# Patient Record
Sex: Female | Born: 1960 | Race: White | Hispanic: No | Marital: Married | Smoking: Never smoker
Health system: Southern US, Community
[De-identification: ages and names within clinical notes are randomized; demographics above are authoritative.]

## PROBLEM LIST (undated history)

## (undated) ENCOUNTER — Emergency Department: Admission: EM | Source: Home / Self Care

## (undated) DIAGNOSIS — I359 Nonrheumatic aortic valve disorder, unspecified: Secondary | ICD-10-CM

## (undated) DIAGNOSIS — R112 Nausea with vomiting, unspecified: Secondary | ICD-10-CM

## (undated) DIAGNOSIS — T7840XA Allergy, unspecified, initial encounter: Secondary | ICD-10-CM

## (undated) DIAGNOSIS — K219 Gastro-esophageal reflux disease without esophagitis: Secondary | ICD-10-CM

## (undated) DIAGNOSIS — I330 Acute and subacute infective endocarditis: Secondary | ICD-10-CM

## (undated) DIAGNOSIS — I890 Lymphedema, not elsewhere classified: Secondary | ICD-10-CM

## (undated) DIAGNOSIS — Z9889 Other specified postprocedural states: Secondary | ICD-10-CM

## (undated) DIAGNOSIS — R55 Syncope and collapse: Secondary | ICD-10-CM

## (undated) DIAGNOSIS — C50919 Malignant neoplasm of unspecified site of unspecified female breast: Secondary | ICD-10-CM

## (undated) DIAGNOSIS — K589 Irritable bowel syndrome without diarrhea: Secondary | ICD-10-CM

## (undated) DIAGNOSIS — R600 Localized edema: Secondary | ICD-10-CM

## (undated) DIAGNOSIS — M4802 Spinal stenosis, cervical region: Secondary | ICD-10-CM

## (undated) DIAGNOSIS — G35 Multiple sclerosis: Secondary | ICD-10-CM

## (undated) DIAGNOSIS — R569 Unspecified convulsions: Secondary | ICD-10-CM

## (undated) DIAGNOSIS — G709 Myoneural disorder, unspecified: Secondary | ICD-10-CM

## (undated) DIAGNOSIS — R011 Cardiac murmur, unspecified: Secondary | ICD-10-CM

## (undated) HISTORY — PX: CHOLECYSTECTOMY: SHX55

## (undated) HISTORY — DX: Gastro-esophageal reflux disease without esophagitis: K21.9

## (undated) HISTORY — DX: Nonrheumatic aortic valve disorder, unspecified: I35.9

## (undated) HISTORY — PX: PORT-A-CATH REMOVAL: SHX5289

## (undated) HISTORY — PX: TONSILLECTOMY: SUR1361

## (undated) HISTORY — DX: Multiple sclerosis: G35

## (undated) HISTORY — DX: Syncope and collapse: R55

## (undated) HISTORY — DX: Allergy, unspecified, initial encounter: T78.40XA

## (undated) HISTORY — DX: Localized edema: R60.0

## (undated) HISTORY — PX: ANKLE SURGERY: SHX546

## (undated) HISTORY — DX: Malignant neoplasm of unspecified site of unspecified female breast: C50.919

## (undated) HISTORY — PX: SPINE SURGERY: SHX786

## (undated) HISTORY — PX: TRANSTHORACIC ECHOCARDIOGRAM: SHX275

## (undated) HISTORY — DX: Spinal stenosis, cervical region: M48.02

## (undated) HISTORY — DX: Acute and subacute infective endocarditis: I33.0

---

## 1999-02-08 DIAGNOSIS — G35 Multiple sclerosis: Secondary | ICD-10-CM

## 1999-02-08 HISTORY — DX: Multiple sclerosis: G35

## 2010-01-19 HISTORY — PX: OTHER SURGICAL HISTORY: SHX169

## 2010-10-09 ENCOUNTER — Ambulatory Visit: Payer: Self-pay | Admitting: Oncology

## 2010-10-15 ENCOUNTER — Ambulatory Visit: Payer: Self-pay | Admitting: Oncology

## 2010-11-03 ENCOUNTER — Other Ambulatory Visit: Payer: Self-pay | Admitting: Diagnostic Neuroimaging

## 2010-11-03 DIAGNOSIS — G35 Multiple sclerosis: Secondary | ICD-10-CM

## 2010-11-08 ENCOUNTER — Ambulatory Visit: Payer: Self-pay | Admitting: Oncology

## 2010-11-10 ENCOUNTER — Ambulatory Visit
Admission: RE | Admit: 2010-11-10 | Discharge: 2010-11-10 | Disposition: A | Discharge status: Home / Self Care | Payer: Medicare Other | Source: Ambulatory Visit | Attending: Diagnostic Neuroimaging | Admitting: Diagnostic Neuroimaging

## 2010-11-10 DIAGNOSIS — G35 Multiple sclerosis: Secondary | ICD-10-CM

## 2010-11-10 MED ORDER — GADOBENATE DIMEGLUMINE 529 MG/ML IV SOLN
17.0000 mL | Freq: Once | INTRAVENOUS | Status: AC | PRN
  Administered 2010-11-10: 17 mL via INTRAVENOUS

## 2011-02-16 DIAGNOSIS — G35 Multiple sclerosis: Secondary | ICD-10-CM | POA: Diagnosis not present

## 2011-02-21 DIAGNOSIS — R55 Syncope and collapse: Secondary | ICD-10-CM | POA: Diagnosis not present

## 2011-02-21 DIAGNOSIS — R011 Cardiac murmur, unspecified: Secondary | ICD-10-CM | POA: Diagnosis not present

## 2011-02-21 DIAGNOSIS — Z01818 Encounter for other preprocedural examination: Secondary | ICD-10-CM | POA: Diagnosis not present

## 2011-02-28 ENCOUNTER — Emergency Department: Payer: Self-pay | Admitting: Emergency Medicine

## 2011-02-28 DIAGNOSIS — R404 Transient alteration of awareness: Secondary | ICD-10-CM | POA: Diagnosis not present

## 2011-02-28 DIAGNOSIS — R5383 Other fatigue: Secondary | ICD-10-CM | POA: Diagnosis not present

## 2011-02-28 DIAGNOSIS — R569 Unspecified convulsions: Secondary | ICD-10-CM | POA: Diagnosis not present

## 2011-02-28 DIAGNOSIS — R5381 Other malaise: Secondary | ICD-10-CM | POA: Diagnosis not present

## 2011-02-28 DIAGNOSIS — R4182 Altered mental status, unspecified: Secondary | ICD-10-CM | POA: Diagnosis not present

## 2011-02-28 DIAGNOSIS — Z79899 Other long term (current) drug therapy: Secondary | ICD-10-CM | POA: Diagnosis not present

## 2011-02-28 DIAGNOSIS — G40909 Epilepsy, unspecified, not intractable, without status epilepticus: Secondary | ICD-10-CM | POA: Diagnosis not present

## 2011-02-28 DIAGNOSIS — G35 Multiple sclerosis: Secondary | ICD-10-CM | POA: Diagnosis not present

## 2011-02-28 LAB — BASIC METABOLIC PANEL
BUN: 14 mg/dL (ref 7–18)
Chloride: 104 mmol/L (ref 98–107)
Co2: 29 mmol/L (ref 21–32)
Creatinine: 0.68 mg/dL (ref 0.60–1.30)
Glucose: 104 mg/dL — ABNORMAL HIGH (ref 65–99)
Potassium: 3.9 mmol/L (ref 3.5–5.1)
Sodium: 144 mmol/L (ref 136–145)

## 2011-02-28 LAB — URINALYSIS, COMPLETE
Ketone: NEGATIVE
Nitrite: NEGATIVE
Ph: 8 (ref 4.5–8.0)
Protein: NEGATIVE
RBC,UR: 1 /HPF (ref 0–5)
Specific Gravity: 1.014 (ref 1.003–1.030)
WBC UR: 10 /HPF (ref 0–5)

## 2011-02-28 LAB — CBC
HCT: 38.5 % (ref 35.0–47.0)
HGB: 13 g/dL (ref 12.0–16.0)
MCH: 30.4 pg (ref 26.0–34.0)
MCV: 90 fL (ref 80–100)
Platelet: 142 10*3/uL — ABNORMAL LOW (ref 150–440)
RBC: 4.27 10*6/uL (ref 3.80–5.20)
WBC: 4.3 10*3/uL (ref 3.6–11.0)

## 2011-02-28 LAB — CK TOTAL AND CKMB (NOT AT ARMC): CK, Total: 107 U/L (ref 21–215)

## 2011-02-28 LAB — TROPONIN I: Troponin-I: <0.02 ng/mL

## 2011-03-03 DIAGNOSIS — R569 Unspecified convulsions: Secondary | ICD-10-CM | POA: Diagnosis not present

## 2011-03-03 DIAGNOSIS — G35 Multiple sclerosis: Secondary | ICD-10-CM | POA: Diagnosis not present

## 2011-03-10 ENCOUNTER — Other Ambulatory Visit: Payer: Self-pay | Admitting: Diagnostic Neuroimaging

## 2011-03-10 DIAGNOSIS — G35 Multiple sclerosis: Secondary | ICD-10-CM

## 2011-03-17 ENCOUNTER — Ambulatory Visit
Admission: RE | Admit: 2011-03-17 | Discharge: 2011-03-17 | Disposition: A | Discharge status: Home / Self Care | Payer: Medicare Other | Source: Ambulatory Visit | Attending: Diagnostic Neuroimaging | Admitting: Diagnostic Neuroimaging

## 2011-03-17 DIAGNOSIS — G35 Multiple sclerosis: Secondary | ICD-10-CM

## 2011-03-17 DIAGNOSIS — I609 Nontraumatic subarachnoid hemorrhage, unspecified: Secondary | ICD-10-CM | POA: Diagnosis not present

## 2011-03-17 MED ORDER — GADOBENATE DIMEGLUMINE 529 MG/ML IV SOLN
15.0000 mL | Freq: Once | INTRAVENOUS | Status: AC | PRN
  Administered 2011-03-17: 15 mL via INTRAVENOUS

## 2011-04-07 DIAGNOSIS — G35 Multiple sclerosis: Secondary | ICD-10-CM | POA: Diagnosis not present

## 2011-04-18 DIAGNOSIS — R569 Unspecified convulsions: Secondary | ICD-10-CM | POA: Diagnosis not present

## 2011-04-18 DIAGNOSIS — G35 Multiple sclerosis: Secondary | ICD-10-CM | POA: Diagnosis not present

## 2011-04-19 DIAGNOSIS — Z01818 Encounter for other preprocedural examination: Secondary | ICD-10-CM | POA: Diagnosis not present

## 2011-04-19 DIAGNOSIS — Z5181 Encounter for therapeutic drug level monitoring: Secondary | ICD-10-CM | POA: Diagnosis not present

## 2011-05-11 ENCOUNTER — Encounter: Payer: Self-pay | Admitting: Diagnostic Neuroimaging

## 2011-05-11 DIAGNOSIS — G35 Multiple sclerosis: Secondary | ICD-10-CM | POA: Diagnosis not present

## 2011-05-11 DIAGNOSIS — M25669 Stiffness of unspecified knee, not elsewhere classified: Secondary | ICD-10-CM | POA: Diagnosis not present

## 2011-05-11 DIAGNOSIS — M6281 Muscle weakness (generalized): Secondary | ICD-10-CM | POA: Diagnosis not present

## 2011-05-11 DIAGNOSIS — IMO0001 Reserved for inherently not codable concepts without codable children: Secondary | ICD-10-CM | POA: Diagnosis not present

## 2011-05-11 DIAGNOSIS — R262 Difficulty in walking, not elsewhere classified: Secondary | ICD-10-CM | POA: Diagnosis not present

## 2011-05-16 DIAGNOSIS — M6281 Muscle weakness (generalized): Secondary | ICD-10-CM | POA: Diagnosis not present

## 2011-05-16 DIAGNOSIS — R262 Difficulty in walking, not elsewhere classified: Secondary | ICD-10-CM | POA: Diagnosis not present

## 2011-05-16 DIAGNOSIS — IMO0001 Reserved for inherently not codable concepts without codable children: Secondary | ICD-10-CM | POA: Diagnosis not present

## 2011-05-16 DIAGNOSIS — M25669 Stiffness of unspecified knee, not elsewhere classified: Secondary | ICD-10-CM | POA: Diagnosis not present

## 2011-05-16 DIAGNOSIS — G35 Multiple sclerosis: Secondary | ICD-10-CM | POA: Diagnosis not present

## 2011-05-23 DIAGNOSIS — R262 Difficulty in walking, not elsewhere classified: Secondary | ICD-10-CM | POA: Diagnosis not present

## 2011-05-23 DIAGNOSIS — M6281 Muscle weakness (generalized): Secondary | ICD-10-CM | POA: Diagnosis not present

## 2011-05-23 DIAGNOSIS — IMO0001 Reserved for inherently not codable concepts without codable children: Secondary | ICD-10-CM | POA: Diagnosis not present

## 2011-05-23 DIAGNOSIS — G35 Multiple sclerosis: Secondary | ICD-10-CM | POA: Diagnosis not present

## 2011-05-23 DIAGNOSIS — M25669 Stiffness of unspecified knee, not elsewhere classified: Secondary | ICD-10-CM | POA: Diagnosis not present

## 2011-05-25 DIAGNOSIS — IMO0001 Reserved for inherently not codable concepts without codable children: Secondary | ICD-10-CM | POA: Diagnosis not present

## 2011-05-25 DIAGNOSIS — M25669 Stiffness of unspecified knee, not elsewhere classified: Secondary | ICD-10-CM | POA: Diagnosis not present

## 2011-05-25 DIAGNOSIS — M6281 Muscle weakness (generalized): Secondary | ICD-10-CM | POA: Diagnosis not present

## 2011-05-25 DIAGNOSIS — G35 Multiple sclerosis: Secondary | ICD-10-CM | POA: Diagnosis not present

## 2011-05-25 DIAGNOSIS — R262 Difficulty in walking, not elsewhere classified: Secondary | ICD-10-CM | POA: Diagnosis not present

## 2011-05-31 DIAGNOSIS — B029 Zoster without complications: Secondary | ICD-10-CM | POA: Diagnosis not present

## 2011-05-31 DIAGNOSIS — L0100 Impetigo, unspecified: Secondary | ICD-10-CM | POA: Diagnosis not present

## 2011-06-01 ENCOUNTER — Other Ambulatory Visit: Payer: Self-pay | Admitting: Diagnostic Neuroimaging

## 2011-06-01 DIAGNOSIS — G35 Multiple sclerosis: Secondary | ICD-10-CM

## 2011-06-01 DIAGNOSIS — B029 Zoster without complications: Secondary | ICD-10-CM

## 2011-06-02 ENCOUNTER — Ambulatory Visit
Admission: RE | Admit: 2011-06-02 | Discharge: 2011-06-02 | Disposition: A | Discharge status: Home / Self Care | Payer: Medicare Other | Source: Ambulatory Visit | Attending: Diagnostic Neuroimaging | Admitting: Diagnostic Neuroimaging

## 2011-06-02 DIAGNOSIS — G35 Multiple sclerosis: Secondary | ICD-10-CM

## 2011-06-02 DIAGNOSIS — B029 Zoster without complications: Secondary | ICD-10-CM | POA: Diagnosis not present

## 2011-06-02 MED ORDER — GADOBENATE DIMEGLUMINE 529 MG/ML IV SOLN
16.0000 mL | Freq: Once | INTRAVENOUS | Status: AC | PRN
  Administered 2011-06-02: 16 mL via INTRAVENOUS

## 2011-06-03 DIAGNOSIS — G35 Multiple sclerosis: Secondary | ICD-10-CM | POA: Diagnosis not present

## 2011-06-03 DIAGNOSIS — Z452 Encounter for adjustment and management of vascular access device: Secondary | ICD-10-CM | POA: Diagnosis not present

## 2011-06-03 DIAGNOSIS — B029 Zoster without complications: Secondary | ICD-10-CM | POA: Diagnosis not present

## 2011-06-06 DIAGNOSIS — B029 Zoster without complications: Secondary | ICD-10-CM | POA: Diagnosis not present

## 2011-06-06 DIAGNOSIS — Z452 Encounter for adjustment and management of vascular access device: Secondary | ICD-10-CM | POA: Diagnosis not present

## 2011-06-06 DIAGNOSIS — G35 Multiple sclerosis: Secondary | ICD-10-CM | POA: Diagnosis not present

## 2011-06-08 ENCOUNTER — Encounter: Payer: Self-pay | Admitting: Diagnostic Neuroimaging

## 2011-06-08 DIAGNOSIS — G40209 Localization-related (focal) (partial) symptomatic epilepsy and epileptic syndromes with complex partial seizures, not intractable, without status epilepticus: Secondary | ICD-10-CM | POA: Insufficient documentation

## 2011-06-08 DIAGNOSIS — R55 Syncope and collapse: Secondary | ICD-10-CM

## 2011-06-08 DIAGNOSIS — G35 Multiple sclerosis: Secondary | ICD-10-CM | POA: Insufficient documentation

## 2011-06-08 DIAGNOSIS — B029 Zoster without complications: Secondary | ICD-10-CM | POA: Insufficient documentation

## 2011-06-08 DIAGNOSIS — B009 Herpesviral infection, unspecified: Secondary | ICD-10-CM

## 2011-06-09 ENCOUNTER — Ambulatory Visit (INDEPENDENT_AMBULATORY_CARE_PROVIDER_SITE_OTHER): Payer: Medicare Other | Admitting: Internal Medicine

## 2011-06-09 ENCOUNTER — Encounter: Payer: Self-pay | Admitting: Internal Medicine

## 2011-06-09 DIAGNOSIS — B029 Zoster without complications: Secondary | ICD-10-CM

## 2011-06-09 NOTE — Progress Notes (Signed)
  Subjective:    Patient ID: Rebecca [name], female    DOB: [birth year], 51 y.o.   MRN: [id number]  HPI She comes in as a new patient with a complaint of a zoster infection. She has a history of multiple sclerosis and is on immunosuppressive therapy and developed right facial and right ear zoster. No history of this before. She has been on acyclovir IV for about 5 or 6 days started by her neurologist. She does have a port in place so was able to do that. She had seen a dermatologist prior to that and was on about 2 days of oral Valtrex. Her lesions are crusting over. She does have significant pain but it is improved. She though has been unable to tolerate the acyclovir to 2 diarrhea. Because she is off her MS medications she has had difficulty ambulating on top of that. Fever and no chills.   Review of Systems  Constitutional: Negative.   HENT: Positive for ear pain and ear discharge. Negative for tinnitus.   Eyes: Negative.   Respiratory: Negative.   Cardiovascular: Negative.   Gastrointestinal: Negative.   Skin: Positive for rash and wound.  Hematological: Negative.        Objective:   Physical Exam  Skin:       Right jaw facial lesions consistent with zoster that are crusting with no discharge. Also with right ear lesion though the outer ear is clear. No surrounding erythema.          Assessment & Plan:

## 2011-06-09 NOTE — Assessment & Plan Note (Signed)
I discussed with her the treatment course of the zoster. I do agree with the IV acyclovir however since she is not tolerating it, and she has received 5 days, I do feel it would be appropriate to change back to the oral therapy with Valtrex 1000 mg 3 times a day for one more week. The lesions are drying and appeared to be healing well. The patient already has Valtrex at home and will continue that and acyclovir can be stopped.

## 2011-06-10 DIAGNOSIS — Z452 Encounter for adjustment and management of vascular access device: Secondary | ICD-10-CM | POA: Diagnosis not present

## 2011-06-10 DIAGNOSIS — B029 Zoster without complications: Secondary | ICD-10-CM | POA: Diagnosis not present

## 2011-06-10 DIAGNOSIS — G35 Multiple sclerosis: Secondary | ICD-10-CM | POA: Diagnosis not present

## 2011-07-27 DIAGNOSIS — B029 Zoster without complications: Secondary | ICD-10-CM | POA: Diagnosis not present

## 2011-07-27 DIAGNOSIS — G35 Multiple sclerosis: Secondary | ICD-10-CM | POA: Diagnosis not present

## 2011-07-29 ENCOUNTER — Other Ambulatory Visit: Payer: Self-pay | Admitting: Diagnostic Neuroimaging

## 2011-07-29 DIAGNOSIS — G35 Multiple sclerosis: Secondary | ICD-10-CM

## 2011-07-29 DIAGNOSIS — B029 Zoster without complications: Secondary | ICD-10-CM

## 2011-08-03 ENCOUNTER — Ambulatory Visit
Admission: RE | Admit: 2011-08-03 | Discharge: 2011-08-03 | Disposition: A | Discharge status: Home / Self Care | Payer: Medicare Other | Source: Ambulatory Visit | Attending: Diagnostic Neuroimaging | Admitting: Diagnostic Neuroimaging

## 2011-08-03 DIAGNOSIS — B029 Zoster without complications: Secondary | ICD-10-CM

## 2011-08-03 DIAGNOSIS — G35D Multiple sclerosis, unspecified: Secondary | ICD-10-CM

## 2011-08-03 DIAGNOSIS — G35 Multiple sclerosis: Secondary | ICD-10-CM

## 2011-08-03 MED ORDER — GADOBENATE DIMEGLUMINE 529 MG/ML IV SOLN: 15.0000 mL | Freq: Once | INTRAVENOUS | Status: AC | PRN

## 2011-09-15 DIAGNOSIS — G35 Multiple sclerosis: Secondary | ICD-10-CM | POA: Diagnosis not present

## 2011-09-15 DIAGNOSIS — B029 Zoster without complications: Secondary | ICD-10-CM | POA: Diagnosis not present

## 2011-09-21 DIAGNOSIS — M4712 Other spondylosis with myelopathy, cervical region: Secondary | ICD-10-CM | POA: Diagnosis not present

## 2011-09-21 DIAGNOSIS — M4802 Spinal stenosis, cervical region: Secondary | ICD-10-CM | POA: Diagnosis not present

## 2011-09-21 DIAGNOSIS — G35 Multiple sclerosis: Secondary | ICD-10-CM | POA: Diagnosis not present

## 2011-09-21 DIAGNOSIS — M542 Cervicalgia: Secondary | ICD-10-CM | POA: Diagnosis not present

## 2011-09-21 DIAGNOSIS — M5000 Cervical disc disorder with myelopathy, unspecified cervical region: Secondary | ICD-10-CM | POA: Diagnosis not present

## 2011-09-27 ENCOUNTER — Other Ambulatory Visit: Payer: Self-pay | Admitting: Neurosurgery

## 2011-09-27 DIAGNOSIS — G35 Multiple sclerosis: Secondary | ICD-10-CM

## 2011-10-04 ENCOUNTER — Ambulatory Visit
Admission: RE | Admit: 2011-10-04 | Discharge: 2011-10-04 | Disposition: A | Discharge status: Home / Self Care | Payer: Medicare Other | Source: Ambulatory Visit | Attending: Neurosurgery | Admitting: Neurosurgery

## 2011-10-04 ENCOUNTER — Other Ambulatory Visit: Payer: Medicare Other

## 2011-10-04 DIAGNOSIS — M47812 Spondylosis without myelopathy or radiculopathy, cervical region: Secondary | ICD-10-CM | POA: Diagnosis not present

## 2011-10-04 DIAGNOSIS — G35 Multiple sclerosis: Secondary | ICD-10-CM

## 2011-10-04 DIAGNOSIS — M5020 Other cervical disc displacement, unspecified cervical region: Secondary | ICD-10-CM | POA: Diagnosis not present

## 2011-10-13 DIAGNOSIS — M533 Sacrococcygeal disorders, not elsewhere classified: Secondary | ICD-10-CM | POA: Diagnosis not present

## 2011-10-13 DIAGNOSIS — M5000 Cervical disc disorder with myelopathy, unspecified cervical region: Secondary | ICD-10-CM | POA: Diagnosis not present

## 2011-10-13 DIAGNOSIS — G35 Multiple sclerosis: Secondary | ICD-10-CM | POA: Diagnosis not present

## 2011-10-13 DIAGNOSIS — M48061 Spinal stenosis, lumbar region without neurogenic claudication: Secondary | ICD-10-CM | POA: Diagnosis not present

## 2011-10-14 ENCOUNTER — Other Ambulatory Visit: Payer: Self-pay | Admitting: Neurosurgery

## 2011-10-27 DIAGNOSIS — G35 Multiple sclerosis: Secondary | ICD-10-CM | POA: Diagnosis not present

## 2011-11-02 ENCOUNTER — Encounter (HOSPITAL_COMMUNITY): Payer: Self-pay | Admitting: Pharmacy Technician

## 2011-11-04 NOTE — Consult Note (Signed)
Anesthesia chart review: Patient is a 51 year old female scheduled for C5-6, C6-7 anterior cervical fusion on 11/17/2011 by Dr. Venetia Maxon.  PAT is scheduled for 11/11/11.  History is currently listed as Multiple sclerosis diagnosed '01, non-smoker.  Cardiology notes also mention seizures, chronic lymphedema, mild MR/TR by echo 02/2011.  Neurologist is Dr. Etheleen Mayhew at [facility].  Cardiologist is Dr. Retta Mac [phone]).  He saw her in January 2013 for evaluation of murmur and pre-evaluation before starting the drug Gilenya.  He has cleared her for this procedure.  EKG at his office on 04/19/11 showed NSR, probably LVH.  Echo on 02/11/11 (Dr. Luberta Robertson) showed normal left ventricular size and function, EF 65%, moderate left atrial enlargement with mild mitral regurgitation, aortic sclerosis with mild aortic insufficiency, mild tricuspid regurgitation.  I'll request her latest Neurology records.  Additional pre-operative testing will be done at the time of her PAT visit.  Shonna Chock, PA-C 11/04/11 1507

## 2011-11-09 DIAGNOSIS — Z0000 Encounter for general adult medical examination without abnormal findings: Secondary | ICD-10-CM | POA: Diagnosis not present

## 2011-11-09 DIAGNOSIS — M5090 Cervical disc disorder, unspecified, unspecified cervical region: Secondary | ICD-10-CM | POA: Diagnosis not present

## 2011-11-09 DIAGNOSIS — G35 Multiple sclerosis: Secondary | ICD-10-CM | POA: Diagnosis not present

## 2011-11-09 DIAGNOSIS — R569 Unspecified convulsions: Secondary | ICD-10-CM | POA: Diagnosis not present

## 2011-11-10 NOTE — Pre-Procedure Instructions (Signed)
20 Eltha Tingley  11/10/2011   Your procedure is scheduled on:  Thursday, October10th.  Report to [facility] at 5:30 AM.  Call this number if you have problems the morning of surgery: [phone]   Remember:   Do not eat food or drink any liquid:After Midnight.      Take these medicines the morning of surgery with A SIP OF WATER: Amantadine (Symmetrel), Baclofen (Lisoresal), Gabapentin (Neurontin),Levetiracetam (Keppra), Oxybutynin (Ditropan).   Do not wear jewelry, make-up or nail polish.  Do not wear lotions, powders, or perfumes. You may wear deodorant.  Do not shave 48 hours prior to surgery. Men may shave face and neck.  Do not bring valuables to the hospital.  Contacts, dentures or bridgework may not be worn into surgery.  Leave suitcase in the car. After surgery it may be brought to your room.  For patients admitted to the hospital, checkout time is 11:00 AM the day of discharge.   Patients discharged the day of surgery will not be allowed to drive home.  Name and phone number of your driver: NA  Special Instructions: Shower using CHG 2 nights before surgery and the night before surgery.  If you shower the day of surgery use CHG.  Use special wash - you have one bottle of CHG for all showers.  You should use approximately 1/3 of the bottle for each shower.   Please read over the following fact sheets that you were given: Pain Booklet, Coughing and Deep Breathing and Surgical Site Infection Prevention

## 2011-11-11 ENCOUNTER — Encounter (HOSPITAL_COMMUNITY)
Admission: RE | Admit: 2011-11-11 | Discharge: 2011-11-11 | Disposition: A | Discharge status: Home / Self Care | Payer: Medicare Other | Source: Ambulatory Visit | Attending: Neurosurgery | Admitting: Neurosurgery

## 2011-11-11 ENCOUNTER — Encounter (HOSPITAL_COMMUNITY): Payer: Self-pay

## 2011-11-11 HISTORY — DX: Irritable bowel syndrome, unspecified: K58.9

## 2011-11-11 HISTORY — DX: Cardiac murmur, unspecified: R01.1

## 2011-11-11 HISTORY — DX: Unspecified convulsions: R56.9

## 2011-11-11 LAB — BASIC METABOLIC PANEL
BUN: 17 mg/dL (ref 6–23)
CO2: 30 mEq/L (ref 19–32)
Calcium: 9.9 mg/dL (ref 8.4–10.5)
Creatinine, Ser: 0.51 mg/dL (ref 0.50–1.10)
GFR calc Af Amer: >90 mL/min (ref >90)
GFR calc non Af Amer: >90 mL/min (ref >90)
Glucose, Bld: 92 mg/dL (ref 70–99)
Potassium: 3.7 mEq/L (ref 3.5–5.1)
Sodium: 144 mEq/L (ref 135–145)

## 2011-11-11 LAB — CBC
Hemoglobin: 12.3 g/dL (ref 12.0–15.0)
MCH: 30.2 pg (ref 26.0–34.0)
MCV: 88.9 fL (ref 78.0–100.0)
RBC: 4.07 MIL/uL (ref 3.87–5.11)

## 2011-11-11 NOTE — Progress Notes (Addendum)
Pt sees Dr Luberta Robertson , cardiologist.  Pt saw him prior to some of the MS medications.  Pt has a heart murmer that Dr Luberta Robertson said they would keep an eye on.  I faxed a request to Dr Berneice Heinrich requesting EKG (02/2011), 2D Echo , office notes and any other studies.   PCP is Dr Terrill Mohr at [facility].

## 2011-11-16 MED ORDER — CEFAZOLIN SODIUM-DEXTROSE 2-3 GM-% IV SOLR: 2.0000 g | INTRAVENOUS | Status: DC

## 2011-11-17 ENCOUNTER — Ambulatory Visit (HOSPITAL_COMMUNITY): Payer: Medicare Other

## 2011-11-17 ENCOUNTER — Ambulatory Visit (HOSPITAL_COMMUNITY): Payer: Medicare Other | Admitting: Vascular Surgery

## 2011-11-17 ENCOUNTER — Encounter (HOSPITAL_COMMUNITY): Payer: Self-pay | Admitting: *Deleted

## 2011-11-17 ENCOUNTER — Encounter (HOSPITAL_COMMUNITY): Payer: Self-pay | Admitting: Vascular Surgery

## 2011-11-17 ENCOUNTER — Inpatient Hospital Stay (HOSPITAL_COMMUNITY)
Admission: RE | Admit: 2011-11-17 | Discharge: 2011-11-18 | DRG: 473 | Disposition: A | Discharge status: Home / Self Care | Payer: Medicare Other | Source: Ambulatory Visit | Attending: Neurosurgery | Admitting: Neurosurgery

## 2011-11-17 ENCOUNTER — Encounter (HOSPITAL_COMMUNITY)
Admission: RE | Disposition: A | Discharge status: Home / Self Care | Payer: Self-pay | Source: Ambulatory Visit | Attending: Neurosurgery

## 2011-11-17 DIAGNOSIS — R011 Cardiac murmur, unspecified: Secondary | ICD-10-CM | POA: Diagnosis present

## 2011-11-17 DIAGNOSIS — G40909 Epilepsy, unspecified, not intractable, without status epilepticus: Secondary | ICD-10-CM | POA: Diagnosis not present

## 2011-11-17 DIAGNOSIS — K219 Gastro-esophageal reflux disease without esophagitis: Secondary | ICD-10-CM | POA: Diagnosis present

## 2011-11-17 DIAGNOSIS — M5000 Cervical disc disorder with myelopathy, unspecified cervical region: Secondary | ICD-10-CM | POA: Diagnosis not present

## 2011-11-17 DIAGNOSIS — G35 Multiple sclerosis: Secondary | ICD-10-CM | POA: Diagnosis not present

## 2011-11-17 DIAGNOSIS — R269 Unspecified abnormalities of gait and mobility: Secondary | ICD-10-CM | POA: Diagnosis not present

## 2011-11-17 DIAGNOSIS — Z981 Arthrodesis status: Secondary | ICD-10-CM | POA: Diagnosis not present

## 2011-11-17 DIAGNOSIS — M47812 Spondylosis without myelopathy or radiculopathy, cervical region: Secondary | ICD-10-CM | POA: Diagnosis present

## 2011-11-17 HISTORY — PX: ANTERIOR CERVICAL DECOMP/DISCECTOMY FUSION: SHX1161

## 2011-11-17 SURGERY — ANTERIOR CERVICAL DECOMPRESSION/DISCECTOMY FUSION 2 LEVELS
Anesthesia: General | Site: Neck | Wound class: Clean

## 2011-11-17 MED ORDER — GABAPENTIN (ONCE-DAILY) 600 MG PO TABS
1800.0000 mg | ORAL_TABLET | Freq: Every evening | ORAL | Status: DC
  Administered 2011-11-17: 1800 mg via ORAL
  Filled 2011-11-17: qty 1

## 2011-11-17 MED ORDER — LACTATED RINGERS IV SOLN
INTRAVENOUS | Status: DC | PRN
  Administered 2011-11-17 (×2): via INTRAVENOUS

## 2011-11-17 MED ORDER — NALOXONE HCL 0.4 MG/ML IJ SOLN
INTRAMUSCULAR | Status: DC | PRN
  Administered 2011-11-17: 0.2 mg via INTRAVENOUS

## 2011-11-17 MED ORDER — BUPIVACAINE HCL (PF) 0.5 % IJ SOLN
INTRAMUSCULAR | Status: DC | PRN
  Administered 2011-11-17: 3 mL

## 2011-11-17 MED ORDER — AMANTADINE HCL 100 MG PO CAPS
100.0000 mg | ORAL_CAPSULE | Freq: Three times a day (TID) | ORAL | Status: DC
  Administered 2011-11-17 (×2): 100 mg via ORAL
  Filled 2011-11-17 (×5): qty 1

## 2011-11-17 MED ORDER — ONDANSETRON HCL 4 MG/2ML IJ SOLN: 4.0000 mg | Freq: Once | INTRAMUSCULAR | Status: DC | PRN

## 2011-11-17 MED ORDER — OXYCODONE HCL 5 MG/5ML PO SOLN: 5.0000 mg | Freq: Once | ORAL | Status: DC | PRN

## 2011-11-17 MED ORDER — HYDROMORPHONE HCL PF 1 MG/ML IJ SOLN
INTRAMUSCULAR | Status: AC
  Filled 2011-11-17: qty 1

## 2011-11-17 MED ORDER — FLEET ENEMA 7-19 GM/118ML RE ENEM
1.0000 | ENEMA | Freq: Once | RECTAL | Status: AC | PRN
  Filled 2011-11-17: qty 1

## 2011-11-17 MED ORDER — MENTHOL 3 MG MT LOZG: 1.0000 | LOZENGE | OROMUCOSAL | Status: DC | PRN

## 2011-11-17 MED ORDER — GABAPENTIN (ONCE-DAILY) 600 MG PO TABS: 1800.0000 mg | ORAL_TABLET | Freq: Every evening | ORAL | Status: DC

## 2011-11-17 MED ORDER — CALCIUM GLUCONATE 500 MG PO TABS: 500.0000 mg | ORAL_TABLET | Freq: Every day | ORAL | Status: DC

## 2011-11-17 MED ORDER — SENNA 8.6 MG PO TABS
1.0000 | ORAL_TABLET | Freq: Two times a day (BID) | ORAL | Status: DC
  Filled 2011-11-17 (×3): qty 1

## 2011-11-17 MED ORDER — DEXAMETHASONE SODIUM PHOSPHATE 4 MG/ML IJ SOLN
INTRAMUSCULAR | Status: DC | PRN
  Administered 2011-11-17: 10 mg via INTRAVENOUS

## 2011-11-17 MED ORDER — LIDOCAINE-EPINEPHRINE 1 %-1:100000 IJ SOLN
INTRAMUSCULAR | Status: DC | PRN
  Administered 2011-11-17: 3 mL via INTRADERMAL

## 2011-11-17 MED ORDER — GABAPENTIN 300 MG PO CAPS
600.0000 mg | ORAL_CAPSULE | Freq: Three times a day (TID) | ORAL | Status: DC
  Administered 2011-11-17 (×2): 600 mg via ORAL
  Filled 2011-11-17 (×5): qty 2

## 2011-11-17 MED ORDER — ONDANSETRON HCL 4 MG/2ML IJ SOLN
INTRAMUSCULAR | Status: DC | PRN
  Administered 2011-11-17: 4 mg via INTRAVENOUS

## 2011-11-17 MED ORDER — OXYCODONE HCL 5 MG PO TABS: 5.0000 mg | ORAL_TABLET | Freq: Once | ORAL | Status: DC | PRN

## 2011-11-17 MED ORDER — POLYETHYLENE GLYCOL 3350 17 G PO PACK
17.0000 g | PACK | Freq: Every day | ORAL | Status: DC | PRN
  Filled 2011-11-17: qty 1

## 2011-11-17 MED ORDER — HEMOSTATIC AGENTS (NO CHARGE) OPTIME
TOPICAL | Status: DC | PRN
  Administered 2011-11-17: 1 via TOPICAL

## 2011-11-17 MED ORDER — LIDOCAINE HCL (CARDIAC) 20 MG/ML IV SOLN
INTRAVENOUS | Status: DC | PRN
  Administered 2011-11-17: 100 mg via INTRAVENOUS

## 2011-11-17 MED ORDER — ONDANSETRON HCL 4 MG/2ML IJ SOLN
4.0000 mg | INTRAMUSCULAR | Status: DC | PRN
  Administered 2011-11-17 (×2): 4 mg via INTRAVENOUS
  Filled 2011-11-17 (×2): qty 2

## 2011-11-17 MED ORDER — MORPHINE SULFATE 2 MG/ML IJ SOLN
1.0000 mg | INTRAMUSCULAR | Status: DC | PRN
  Administered 2011-11-17: 2 mg via INTRAVENOUS
  Filled 2011-11-17: qty 1

## 2011-11-17 MED ORDER — FENTANYL CITRATE 0.05 MG/ML IJ SOLN
INTRAMUSCULAR | Status: DC | PRN
  Administered 2011-11-17: 100 ug via INTRAVENOUS
  Administered 2011-11-17 (×2): 50 ug via INTRAVENOUS

## 2011-11-17 MED ORDER — SODIUM CHLORIDE 0.9 % IJ SOLN
3.0000 mL | Freq: Two times a day (BID) | INTRAMUSCULAR | Status: DC
  Administered 2011-11-17 (×2): 3 mL via INTRAVENOUS

## 2011-11-17 MED ORDER — KCL IN DEXTROSE-NACL 20-5-0.45 MEQ/L-%-% IV SOLN
INTRAVENOUS | Status: DC
  Filled 2011-11-17 (×3): qty 1000

## 2011-11-17 MED ORDER — GLYCOPYRROLATE 0.2 MG/ML IJ SOLN
INTRAMUSCULAR | Status: DC | PRN
  Administered 2011-11-17: .7 mg via INTRAVENOUS

## 2011-11-17 MED ORDER — HYDROMORPHONE HCL PF 1 MG/ML IJ SOLN
0.2500 mg | INTRAMUSCULAR | Status: DC | PRN
  Administered 2011-11-17 (×3): 0.25 mg via INTRAVENOUS

## 2011-11-17 MED ORDER — BACLOFEN 10 MG PO TABS
10.0000 mg | ORAL_TABLET | Freq: Three times a day (TID) | ORAL | Status: DC
  Administered 2011-11-17: 10 mg via ORAL
  Administered 2011-11-17: 20 mg via ORAL
  Filled 2011-11-17 (×5): qty 2

## 2011-11-17 MED ORDER — SODIUM CHLORIDE 0.9 % IR SOLN
Status: DC | PRN
  Administered 2011-11-17: 09:00:00

## 2011-11-17 MED ORDER — OXYBUTYNIN CHLORIDE 5 MG PO TABS
5.0000 mg | ORAL_TABLET | Freq: Two times a day (BID) | ORAL | Status: DC
  Administered 2011-11-17: 5 mg via ORAL
  Filled 2011-11-17 (×3): qty 1

## 2011-11-17 MED ORDER — ROCURONIUM BROMIDE 100 MG/10ML IV SOLN
INTRAVENOUS | Status: DC | PRN
  Administered 2011-11-17: 50 mg via INTRAVENOUS

## 2011-11-17 MED ORDER — HYDROCODONE-ACETAMINOPHEN 5-325 MG PO TABS: 1.0000 | ORAL_TABLET | ORAL | Status: DC | PRN

## 2011-11-17 MED ORDER — CEFAZOLIN SODIUM 1-5 GM-% IV SOLN
1.0000 g | Freq: Three times a day (TID) | INTRAVENOUS | Status: AC
  Administered 2011-11-17 – 2011-11-18 (×2): 1 g via INTRAVENOUS
  Filled 2011-11-17 (×2): qty 50

## 2011-11-17 MED ORDER — CALCIUM CARBONATE 1250 (500 CA) MG PO TABS
1.0000 | ORAL_TABLET | Freq: Every day | ORAL | Status: DC
  Filled 2011-11-17: qty 1

## 2011-11-17 MED ORDER — LEVETIRACETAM 500 MG PO TABS
500.0000 mg | ORAL_TABLET | Freq: Two times a day (BID) | ORAL | Status: DC
  Administered 2011-11-17 (×2): 500 mg via ORAL
  Filled 2011-11-17 (×4): qty 1

## 2011-11-17 MED ORDER — SODIUM CHLORIDE 0.9 % IJ SOLN: 3.0000 mL | INTRAMUSCULAR | Status: DC | PRN

## 2011-11-17 MED ORDER — 0.9 % SODIUM CHLORIDE (POUR BTL) OPTIME
TOPICAL | Status: DC | PRN
  Administered 2011-11-17: 1000 mL

## 2011-11-17 MED ORDER — MIDAZOLAM HCL 5 MG/5ML IJ SOLN
INTRAMUSCULAR | Status: DC | PRN
  Administered 2011-11-17: 2 mg via INTRAVENOUS

## 2011-11-17 MED ORDER — MEPERIDINE HCL 25 MG/ML IJ SOLN: 6.2500 mg | INTRAMUSCULAR | Status: DC | PRN

## 2011-11-17 MED ORDER — ACETAMINOPHEN 325 MG PO TABS: 650.0000 mg | ORAL_TABLET | ORAL | Status: DC | PRN

## 2011-11-17 MED ORDER — PROPOFOL 10 MG/ML IV BOLUS
INTRAVENOUS | Status: DC | PRN
  Administered 2011-11-17: 110 mg via INTRAVENOUS

## 2011-11-17 MED ORDER — BACITRACIN 50000 UNITS IM SOLR
INTRAMUSCULAR | Status: AC
  Filled 2011-11-17: qty 1

## 2011-11-17 MED ORDER — NEOSTIGMINE METHYLSULFATE 1 MG/ML IJ SOLN
INTRAMUSCULAR | Status: DC | PRN
  Administered 2011-11-17: 4 mg via INTRAVENOUS

## 2011-11-17 MED ORDER — SODIUM CHLORIDE 0.9 % IV SOLN
INTRAVENOUS | Status: AC
  Filled 2011-11-17: qty 500

## 2011-11-17 MED ORDER — DOCUSATE SODIUM 100 MG PO CAPS
100.0000 mg | ORAL_CAPSULE | Freq: Two times a day (BID) | ORAL | Status: DC
  Filled 2011-11-17: qty 1

## 2011-11-17 MED ORDER — PHENOL 1.4 % MT LIQD: 1.0000 | OROMUCOSAL | Status: DC | PRN

## 2011-11-17 MED ORDER — PHENYLEPHRINE HCL 10 MG/ML IJ SOLN
INTRAMUSCULAR | Status: DC | PRN
  Administered 2011-11-17 (×3): 40 ug via INTRAVENOUS
  Administered 2011-11-17: 80 ug via INTRAVENOUS
  Administered 2011-11-17 (×4): 40 ug via INTRAVENOUS

## 2011-11-17 MED ORDER — INTERFERON BETA-1A 44 MCG/0.5ML ~~LOC~~ SOLN: 44.0000 ug | SUBCUTANEOUS | Status: DC

## 2011-11-17 MED ORDER — ACETAMINOPHEN 650 MG RE SUPP: 650.0000 mg | RECTAL | Status: DC | PRN

## 2011-11-17 MED ORDER — OXYCODONE-ACETAMINOPHEN 5-325 MG PO TABS
1.0000 | ORAL_TABLET | ORAL | Status: DC | PRN
  Filled 2011-11-17: qty 2

## 2011-11-17 MED ORDER — BISACODYL 10 MG RE SUPP: 10.0000 mg | Freq: Every day | RECTAL | Status: DC | PRN

## 2011-11-17 MED ORDER — CEFAZOLIN SODIUM-DEXTROSE 2-3 GM-% IV SOLR
INTRAVENOUS | Status: AC
  Administered 2011-11-17: 2 g via INTRAVENOUS
  Filled 2011-11-17: qty 50

## 2011-11-17 MED ORDER — THROMBIN 5000 UNITS EX KIT
PACK | CUTANEOUS | Status: DC | PRN
  Administered 2011-11-17 (×2): 5000 [IU] via TOPICAL

## 2011-11-17 SURGICAL SUPPLY — 67 items
BAG DECANTER FOR FLEXI CONT (MISCELLANEOUS) ×2 IMPLANT
BANDAGE GAUZE ELAST BULKY 4 IN (GAUZE/BANDAGES/DRESSINGS) ×4 IMPLANT
BENZOIN TINCTURE PRP APPL 2/3 (GAUZE/BANDAGES/DRESSINGS) IMPLANT
BIT DRILL 2.3 12 FIXED (INSTRUMENTS) ×1 IMPLANT
BIT DRILL NEURO 2X3.1 SFT TUCH (MISCELLANEOUS) ×1 IMPLANT
BLADE ULTRA TIP 2M (BLADE) ×2 IMPLANT
BUR BARREL STRAIGHT FLUTE 4.0 (BURR) ×2 IMPLANT
CANISTER SUCTION 2500CC (MISCELLANEOUS) ×2 IMPLANT
CLOTH BEACON ORANGE TIMEOUT ST (SAFETY) ×2 IMPLANT
CONT SPEC 4OZ CLIKSEAL STRL BL (MISCELLANEOUS) ×2 IMPLANT
COVER MAYO STAND STRL (DRAPES) ×2 IMPLANT
DERMABOND ADVANCED (GAUZE/BANDAGES/DRESSINGS) ×1
DERMABOND ADVANCED .7 DNX12 (GAUZE/BANDAGES/DRESSINGS) ×1 IMPLANT
DRAPE LAPAROTOMY 100X72 PEDS (DRAPES) ×2 IMPLANT
DRAPE MICROSCOPE LEICA (MISCELLANEOUS) ×2 IMPLANT
DRAPE POUCH INSTRU U-SHP 10X18 (DRAPES) ×2 IMPLANT
DRAPE PROXIMA HALF (DRAPES) IMPLANT
DRESSING TELFA 8X3 (GAUZE/BANDAGES/DRESSINGS) IMPLANT
DRILL 12MM (INSTRUMENTS) ×2
DRILL NEURO 2X3.1 SOFT TOUCH (MISCELLANEOUS) ×2
DURAPREP 6ML APPLICATOR 50/CS (WOUND CARE) ×2 IMPLANT
ELECT COATED BLADE 2.86 ST (ELECTRODE) ×2 IMPLANT
ELECT REM PT RETURN 9FT ADLT (ELECTROSURGICAL) ×2
ELECTRODE REM PT RTRN 9FT ADLT (ELECTROSURGICAL) ×1 IMPLANT
GAUZE SPONGE 4X4 16PLY XRAY LF (GAUZE/BANDAGES/DRESSINGS) IMPLANT
GLOVE BIO SURGEON STRL SZ8 (GLOVE) ×2 IMPLANT
GLOVE BIOGEL PI IND STRL 8 (GLOVE) ×1 IMPLANT
GLOVE BIOGEL PI IND STRL 8.5 (GLOVE) ×2 IMPLANT
GLOVE BIOGEL PI INDICATOR 8 (GLOVE) ×1
GLOVE BIOGEL PI INDICATOR 8.5 (GLOVE) ×2
GLOVE ECLIPSE 7.5 STRL STRAW (GLOVE) ×2 IMPLANT
GLOVE ECLIPSE 8.0 STRL XLNG CF (GLOVE) ×2 IMPLANT
GLOVE EXAM NITRILE LRG STRL (GLOVE) IMPLANT
GLOVE EXAM NITRILE MD LF STRL (GLOVE) ×2 IMPLANT
GLOVE EXAM NITRILE XL STR (GLOVE) IMPLANT
GLOVE EXAM NITRILE XS STR PU (GLOVE) IMPLANT
GLOVE SURG SS PI 8.0 STRL IVOR (GLOVE) ×4 IMPLANT
GOWN BRE IMP SLV AUR LG STRL (GOWN DISPOSABLE) IMPLANT
GOWN BRE IMP SLV AUR XL STRL (GOWN DISPOSABLE) ×2 IMPLANT
GOWN STRL REIN 2XL LVL4 (GOWN DISPOSABLE) ×4 IMPLANT
HEAD HALTER (SOFTGOODS) ×2 IMPLANT
KIT BASIN OR (CUSTOM PROCEDURE TRAY) ×2 IMPLANT
KIT ROOM TURNOVER OR (KITS) ×2 IMPLANT
NEEDLE HYPO 18GX1.5 BLUNT FILL (NEEDLE) IMPLANT
NEEDLE HYPO 25X1 1.5 SAFETY (NEEDLE) ×2 IMPLANT
NEEDLE SPNL 22GX3.5 QUINCKE BK (NEEDLE) ×2 IMPLANT
NS IRRIG 1000ML POUR BTL (IV SOLUTION) ×2 IMPLANT
PACK LAMINECTOMY NEURO (CUSTOM PROCEDURE TRAY) ×2 IMPLANT
PAD ARMBOARD 7.5X6 YLW CONV (MISCELLANEOUS) ×2 IMPLANT
PIN DISTRACTION 14MM (PIN) ×6 IMPLANT
PLATE 30MM (Plate) ×2 IMPLANT
RUBBERBAND STERILE (MISCELLANEOUS) ×4 IMPLANT
SCREW 12MM (Screw) ×12 IMPLANT
SPACER ASSEM CERV LORD 6M (Spacer) ×2 IMPLANT
SPACER ASSEM CERV LORD 7M (Spacer) ×2 IMPLANT
SPONGE GAUZE 4X4 12PLY (GAUZE/BANDAGES/DRESSINGS) IMPLANT
SPONGE INTESTINAL PEANUT (DISPOSABLE) ×2 IMPLANT
SPONGE SURGIFOAM ABS GEL SZ50 (HEMOSTASIS) ×2 IMPLANT
STAPLER SKIN PROX WIDE 3.9 (STAPLE) ×2 IMPLANT
STRIP CLOSURE SKIN 1/2X4 (GAUZE/BANDAGES/DRESSINGS) ×2 IMPLANT
SUT VIC AB 3-0 SH 8-18 (SUTURE) ×4 IMPLANT
SYR 20ML ECCENTRIC (SYRINGE) ×2 IMPLANT
SYR 3ML LL SCALE MARK (SYRINGE) ×2 IMPLANT
TOWEL OR 17X24 6PK STRL BLUE (TOWEL DISPOSABLE) ×2 IMPLANT
TOWEL OR 17X26 10 PK STRL BLUE (TOWEL DISPOSABLE) ×2 IMPLANT
TRAP SPECIMEN MUCOUS 40CC (MISCELLANEOUS) ×2 IMPLANT
WATER STERILE IRR 1000ML POUR (IV SOLUTION) ×2 IMPLANT

## 2011-11-17 NOTE — Anesthesia Procedure Notes (Signed)
Procedure Name: Intubation Date/Time: 11/17/2011 7:50 AM Performed by: Rossie Muskrat L Pre-anesthesia Checklist: Patient identified, Timeout performed, Emergency Drugs available, Suction available and Patient being monitored Patient Re-evaluated:Patient Re-evaluated prior to inductionOxygen Delivery Method: Circle system utilized Preoxygenation: Pre-oxygenation with 100% oxygen Intubation Type: IV induction Ventilation: Mask ventilation without difficulty Laryngoscope Size: Miller and 2 Grade View: Grade I Tube size: 7.5 mm Number of attempts: 1 Airway Equipment and Method: Stylet Placement Confirmation: ETT inserted through vocal cords under direct vision,  breath sounds checked- equal and bilateral and positive ETCO2 Secured at: 20 cm Tube secured with: Tape Dental Injury: Teeth and Oropharynx as per pre-operative assessment

## 2011-11-17 NOTE — H&P (Signed)
Rebecca [name]. Rebecca [name]  #[id number] DOB:  [birth year] 10/13/2011:     [name] comes back today and I had a long discussion with her and her mother-in-law and also I spoke with Dr. Marjory Lies.  Her MRI does show that she has significant cord compression and canal stenosis both at C5-6 and C6-7 levels where the AP diameter of the spinal canal is markedly reduced.  On further discussion with her, she has noticed worsening of her problems with gait and this may well be related to her cervical spinal cord compression.  She has changed MS drugs fairly recently but Dr. Marjory Lies says that her plaques have not changes in size.  I told her that it is difficult to assess the degree of problems related to her MS versus cervical cord compression but that this is a treatable condition and therefore given the fact that she has AP diameter of the spinal canal of 4-5 mm at the affected level it would make sense to proceed with surgery.  She understands the risks of exacerbation of her MS, undergoing general anesthesia, also that she may have worsening of her neurologic condition, and that also it may not help her if all of her symptoms are related to MS. However, I do think it is reasonable to proceed with anterior cervical decompression and fusion C5-6 and 6-7 levels.  Risks and benefits are discussed in detail and we went over the models.  The patient met with Georgiann Cocker, my nurse, to discuss the details of surgery and its attendant risks and benefits and went over models and gave her literature.  We answered all of her questions. She wishes to proceed and understands the potential risks.           Rebecca [name]. Rebecca [name], M.D./[facility]de [facility]  Rebecca [name]. [place]  #[id number] DOB:  05/31/1960  September 21, 2011  HISTORY:     Rebecca [name] is a 51 year old woman with a history of neck pain and stenosis on MRI who also has multiple sclerosis.  She was recently living in [place] and has now moved to [place].  She is in a wheelchair  when she is out of her house and otherwise is generally on a walker.  She was diagnosed with Multiple Sclerosis in 2001.  She has had shingles on the right side of her face.  She says she has gradually worsened since 2001. She was using a cane from 2003 to 2005, a four pronged walker from 2005 to 2008 and walker from 2008 to now but is worse with walking.  Dr. Marjory Lies is evaluating her MS for the effectiveness of treatment.     REVIEW OF SYSTEMS:   A detailed Review of Systems sheet was reviewed with the patient.  Pertinent positives include: Eyes-glasses; Cardiovascular-heart murmur, swelling in feet; Musculoskeletal-arm weakness, leg weakness, arm pain, leg pain, joint pain or swelling and neck pain; Neurological-fainting spells, difficulty with speech.   All other systems are negative; this includes Constitutional symptoms, Ears, nose, mouth, throat, Endocrine, Respiratory, Gastrointestinal, Genitourinary, Integumentary & Breast, Psychiatric, Hematologic/ Lymphatic, and Allergic/Immunologic.    PAST MEDICAL HISTORY:      Current Medical Conditions:    As previously described.      Prior Operations and Hospitalizations:   She has had a cholecystectomy, port placement in 2011, left ankle surgery.      Medications and Allergies:  She has been taking Baclofen 10 mg. 2 in the morning and 1 at lunch and 2 in the evening  and Gabapentin 300 mg. 2-3 times daily.  She is currently on Rebif 3 times weekly.  Amantadine 100 mg. t.i.d., Oxybutynin 5 mg. b.i.d., Levetiracetam 500 mg. b.i.d., Gralise 3 with dinner, Circulatory and vein support q.d., Ultimate Eye support 14 mg. b.i.d., Essential multivitamins q.d., Glucosamine Chondroitin b.i.d., Healthy Hair and Nails b.i.d., Omega 3 b.i.d., Calcium b.i.d. and Vitamin D1 b.i.d.       Height and Weight:     5'4" tall and 160 pounds.       FAMILY HISTORY:    Both parents are in good health without significant medical problems.      SOCIAL HISTORY:    She is  currently a nonsmoker and nondrinker with no history of substance abuse.    DIAGNOSTIC STUDIES:   Plain radiographs of the cervical spine show cervical spondylosis at C5-6 and C6-7 levels. An incidental note was made on thoracic MRI of what appeared to be some canal stenosis at C6-7.  MRI of her thoracic spine demonstrates a cord lesion consistent with MS, and plaques on the scan of her brain.    PHYSICAL EXAMINATION:      General Appearance:   On examination today, Mrs. Rebecca [name] is a pleasant and cooperative woman in no acute distress.        Blood Pressure, Pulse:     114/76, heart rate 74 and regular, respirations 18.     HEENT - normocephalic, atraumatic.  The pupils are equal, round and reactive to light.  The extraocular muscles are intact.  Sclerae - white.  Conjunctiva - pink.  Oropharynx benign.  Uvula midline.     Neck - there are no masses, meningismus, deformities, tracheal deviation, jugular vein distention or carotid bruits.  There is normal cervical range of motion.  Spurlings' test is negative without reproducible radicular pain turning the patient's head to either side.  Lhermitte's sign is not present with axial compression.      Respiratory - there is normal respiratory effort with good intercostal function.  Lungs are clear to auscultation.  There are no rales, rhonchi or wheezes.      Cardiovascular - the heart has regular rate and rhythm to auscultation.  No murmurs are appreciated.  There is no extremity edema, cyanosis or clubbing.  There are palpable pedal pulses.      Abdomen - soft, nontender, no hepatosplenomegaly appreciated or masses.  There are active bowel sounds.  No guarding or rebound.      Musculoskeletal Examination - she has swelling in both feet and legs which she says is likely related to lymphedema.      NEUROLOGICAL EXAMINATION: The patient is oriented to time, person and place and has good recall of both recent and remote memory with normal attention  span and concentration.  The patient speaks with clear and fluent speech and exhibits normal language function and appropriate fund of knowledge.      Cranial Nerve Examination - pupils are equal, round and reactive to light.  Extraocular movements are full.  Visual fields are full to confrontational testing.  Facial sensation and facial movement are symmetric and intact.  Hearing is intact to finger rub.  Palate is upgoing.  Shoulder shrug is symmetric.  Tongue protrudes in the midline.      Motor Examination - motor strength is 5/5 in the bilateral deltoids, biceps, triceps, handgrips, wrist extensors, interosseous. She has some spasticity in the right upper extremities.  In the lower extremities motor strength is 5/5 in hip  flexion, extension, quadriceps, hamstrings, plantar flexion, dorsiflexion and extensor hallucis longus.  She appears to have good strength on confrontational testing.      Sensory Examination - She does appear to have spasticity in her legs. She does have decreased pin sensation in both upper extremities second through fifth digits principally affected.      Deep Tendon Reflexes - 2 in the biceps, triceps, and brachioradialis, 3 in the knees, 3 in the ankles.  The great toes are downgoing to plantar stimulation.     Cerebellar Examination - normal coordination in upper and lower extremities and normal rapid alternating movements.  Romberg test is negative.    IMPRESSION AND RECOMMENDATIONS: Tamekia Rotter is a 51 year old woman with MS and cervical spondylosis and stenosis.  She has not had an MRI of her cervical spine and I think this would be important since we have only been able to assess her cervical spine with single sagittal image.  My impression is that her symptoms are more likely related to her MS than to cervical spinal pathology.  I would like to make further recommendations after an MRI of her cervical spine has been performed.    60 minutes was spent in direct patient  care.    [facility]   Rebecca [name]. Rebecca [name], M.D.

## 2011-11-17 NOTE — Progress Notes (Signed)
Awake, alert, conversant.  MAEW with full bilateral biceps, triceps, hand intrinsics.  Still spastic and weak in lower extremities. Doing well following ACDF.

## 2011-11-17 NOTE — Progress Notes (Signed)
As above.

## 2011-11-17 NOTE — Plan of Care (Signed)
Problem: Consults Goal: Diagnosis - Spinal Surgery Outcome: Completed/Met Date Met:  11/17/11 Cervical Spine Fusion     

## 2011-11-17 NOTE — Anesthesia Preprocedure Evaluation (Addendum)
Anesthesia Evaluation  Patient identified by MRN, date of birth, ID band Patient awake    Reviewed: Allergy & Precautions, H&P , NPO status , Patient's Chart, lab work & pertinent test results  Airway Mallampati: II TM Distance: >3 FB Neck ROM: full    Dental  (+) Teeth Intact and Dental Advidsory Given   Pulmonary          Cardiovascular + Valvular Problems/Murmurs     Neuro/Psych Seizures -, Well Controlled,  Last episode 4-5 months ago prior to Keppra. No seizures since Keppra started    GI/Hepatic GERD-  Controlled,  Endo/Other    Renal/GU      Musculoskeletal   Abdominal   Peds  Hematology   Anesthesia Other Findings   Reproductive/Obstetrics                          Anesthesia Physical Anesthesia Plan  ASA: III  Anesthesia Plan: General   Post-op Pain Management:    Induction: Intravenous  Airway Management Planned: Oral ETT  Additional Equipment:   Intra-op Plan:   Post-operative Plan: Extubation in OR  Informed Consent: I have reviewed the patients History and Physical, chart, labs and discussed the procedure including the risks, benefits and alternatives for the proposed anesthesia with the patient or authorized representative who has indicated his/her understanding and acceptance.     Plan Discussed with: CRNA and Surgeon  Anesthesia Plan Comments:         Anesthesia Quick Evaluation

## 2011-11-17 NOTE — Brief Op Note (Signed)
11/17/2011  10:01 AM  PATIENT:  Rebecca [name]  51 y.o. female  PRE-OPERATIVE DIAGNOSIS:  Cervical herniated nucleus pulposus with myelopathy, cervical stenosis, Multiple Sclerosis, cervical radiculopathy C 56, C 67  POST-OPERATIVE DIAGNOSIS:  Cervical herniated nucleus pulposus with myelopathy, cervical stenosis, Multiple Sclerosis, cervical radiculopathy C 56, C 67  PROCEDURE:  Procedure(s) (LRB) with comments: ANTERIOR CERVICAL DECOMPRESSION/DISCECTOMY FUSION 2 LEVELS (N/A) - Cervical Five-Six Six-Seven Anterior cervical decompression/diskectomy/fusion with Autograft, allograft, plate C 56 and C 67 levels  SURGEON:  Surgeon(s) and Role:    * Maeola Harman, MD - Primary  PHYSICIAN ASSISTANT: Phoebe Perch, MD  ASSISTANTS: Poteat, RN   ANESTHESIA:   general  EBL:  Total I/O In: 1000 [I.V.:1000] Out: -   BLOOD ADMINISTERED:none  DRAINS: none   LOCAL MEDICATIONS USED:  LIDOCAINE   SPECIMEN:  No Specimen  DISPOSITION OF SPECIMEN:  N/A  COUNTS:  YES  TOURNIQUET:  * No tourniquets in log *  DICTATION: DICTATION: Patient is 51 year old female with 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 An incision was made on the left side of midline after infiltrating the skin and subcutaneous tissues with local lidocaine. The platysmal layer was incised and subplatysmal dissection was performed exposing the anterior border sternocleidomastoid muscle. Using blunt dissection the carotid sheath was kept lateral and trachea and esophagus kept medial exposing the anterior cervical spine. A bent spinal needle was placed it was  felt to be the C5/6 level and at the C 67 level and this was confirmed on intraoperative x-ray. Longus coli muscles were taken down from the anterior cervical spine using electrocautery and key elevator and self-retaining retractor was placed exposing the C5/6 and C6/7 levels. The interspaces were incised and a thorough discectomy was performed. Distraction pins were placed. Initially the C6/7 level was operated. Uncinate spurs and central spondylitic ridges were drilled down with a high-speed drill. The spinal cord dura and both C7 nerve roots were widely decompressed. A large central disc herniation was removed with significant decompression of the spinal cord dura. Hemostasis was assured. After trial sizing a 7mm lordotic bone allograft wedge was selected and packed autograft. This was tamped into position and countersunk appropriately. Attention was the paid to the C5/6 level, where similar decompression was performed.  Uncinate spurs and central spondylitic ridges were drilled down with a high-speed drill. The spinal cord dura and both C6 nerve roots were widely decompressed. Hemostasis was assured. After trial sizing a 6 mm similar graft was selected and packed with bone autograft. This was tamped into position and countersunk appropriately.Additional autograft was packed lateral to the bone grafts at both levels. Distraction weight was removed. A 30 mm trestle luxe anterior cervical plate was affixed to the cervical spine with 12 mm variable-angle screws 2 at C5, 2 at C6 and 2 at C7. All screws were well-positioned and locking mechanisms were engaged. A final X ray was obtained which showed well positioned grafts and anterior plate without complicating features. Soft tissues were inspected and found to be in good repair. The wound was irrigated. The platysma layer was closed with 3-0 Vicryl stitches and the skin was reapproximated with 3-0  Vicryl subcuticular stitches. The wound was dressed with Dermabond.  Counts were correct at the end of the case. Patient was extubated and taken to recovery in stable and satisfactory condition.    PLAN OF CARE: Admit for overnight observation  PATIENT DISPOSITION:  PACU - hemodynamically stable.   Delay start of Pharmacological VTE agent (>24hrs) due to surgical blood loss or risk of bleeding: yes

## 2011-11-17 NOTE — Progress Notes (Signed)
Patient ID: Rebecca [name], female   DOB: [birth year], 51 y.o.   MRN: [id number]  Alert, conversant. Family present. No pain at present. Good strength BUE. Incision without erythema, swelling, or drainage. Dermabond intact. Soft collar in use. No dysphagia with noon meal. Pleased with improvement. Verbalizes understanding of expected periscapular/trapezius pain over next 6-8weeks. Verbalizes understanding of d/c instructions. Plans for d/c to home in am.  Georgiann Cocker, RN, BSN

## 2011-11-17 NOTE — Transfer of Care (Signed)
Immediate Anesthesia Transfer of Care Note  Patient: Rebecca [name]  Procedure(s) Performed: Procedure(s) (LRB) with comments: ANTERIOR CERVICAL DECOMPRESSION/DISCECTOMY FUSION 2 LEVELS (N/A) - Cervical Five-Six Six-Seven Anterior cervical decompression/diskectomy/fusion  Patient Location: PACU  Anesthesia Type: General  Level of Consciousness: awake, alert  and oriented  Airway & Oxygen Therapy: Patient Spontanous Breathing and Patient connected to nasal cannula oxygen  Post-op Assessment: Report given to PACU RN, Post -op Vital signs reviewed and stable and Patient moving all extremities  Post vital signs: Reviewed and stable  Complications: No apparent anesthesia complications

## 2011-11-17 NOTE — Interval H&P Note (Signed)
History and Physical Interval Note:  11/17/2011 6:40 AM  Rebecca [name]  has presented today for surgery, with the diagnosis of Cervical hnp with myelopathy  The various methods of treatment have been discussed with the patient and family. After consideration of risks, benefits and other options for treatment, the patient has consented to  Procedure(s) (LRB) with comments: ANTERIOR CERVICAL DECOMPRESSION/DISCECTOMY FUSION 2 LEVELS (N/A) - C5-6 C6-7 Anterior cervical decompression/diskectomy/fusion as a surgical intervention .  The patient's history has been reviewed, patient examined, no change in status, stable for surgery.  I have reviewed the patient's chart and labs.  Questions were answered to the patient's satisfaction.     [name] D  Date of Initial H&P: 11/17/2011  History reviewed, patient examined, no change in status, stable for surgery.

## 2011-11-17 NOTE — Progress Notes (Signed)
UR COMPLETED  

## 2011-11-17 NOTE — Preoperative (Signed)
Beta Blockers   Reason not to administer Beta Blockers:Not Applicable 

## 2011-11-17 NOTE — Anesthesia Postprocedure Evaluation (Signed)
Anesthesia Post Note  Patient: Rebecca [name]  Procedure(s) Performed: Procedure(s) (LRB): ANTERIOR CERVICAL DECOMPRESSION/DISCECTOMY FUSION 2 LEVELS (N/A)  Anesthesia type: general  Patient location: PACU  Post pain: Pain level controlled  Post assessment: Patient's Cardiovascular Status Stable  Last Vitals:  Filed Vitals:   11/17/11 1045  BP:   Pulse: 91  Temp:   Resp: 15    Post vital signs: Reviewed and stable  Level of consciousness: sedated  Complications: No apparent anesthesia complications

## 2011-11-17 NOTE — Op Note (Signed)
11/17/2011  10:01 AM  PATIENT:  Rebecca [name]  51 y.o. female  PRE-OPERATIVE DIAGNOSIS:  Cervical herniated nucleus pulposus with myelopathy, cervical stenosis, Multiple Sclerosis, cervical radiculopathy C 56, C 67  POST-OPERATIVE DIAGNOSIS:  Cervical herniated nucleus pulposus with myelopathy, cervical stenosis, Multiple Sclerosis, cervical radiculopathy C 56, C 67  PROCEDURE:  Procedure(s) (LRB) with comments: ANTERIOR CERVICAL DECOMPRESSION/DISCECTOMY FUSION 2 LEVELS (N/A) - Cervical Five-Six Six-Seven Anterior cervical decompression/diskectomy/fusion with Autograft, allograft, plate C 56 and C 67 levels  SURGEON:  Surgeon(s) and Role:    * Tyr Franca, MD - Primary  PHYSICIAN ASSISTANT: Hirsch, MD  ASSISTANTS: Poteat, RN   ANESTHESIA:   general  EBL:  Total I/O In: 1000 [I.V.:1000] Out: -   BLOOD ADMINISTERED:none  DRAINS: none   LOCAL MEDICATIONS USED:  LIDOCAINE   SPECIMEN:  No Specimen  DISPOSITION OF SPECIMEN:  N/A  COUNTS:  YES  TOURNIQUET:  * No tourniquets in log *  DICTATION: DICTATION: Patient is 51 year old female with 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 An incision was made on the left side of midline after infiltrating the skin and subcutaneous tissues with local lidocaine. The platysmal layer was incised and subplatysmal dissection was performed exposing the anterior border sternocleidomastoid muscle. Using blunt dissection the carotid sheath was kept lateral and trachea and esophagus kept medial exposing the anterior cervical spine. A bent spinal needle was placed it was  felt to be the C5/6 level and at the C 67 level and this was confirmed on intraoperative x-ray. Longus coli muscles were taken down from the anterior cervical spine using electrocautery and key elevator and self-retaining retractor was placed exposing the C5/6 and C6/7 levels. The interspaces were incised and a thorough discectomy was performed. Distraction pins were placed. Initially the C6/7 level was operated. Uncinate spurs and central spondylitic ridges were drilled down with a high-speed drill. The spinal cord dura and both C7 nerve roots were widely decompressed. A large central disc herniation was removed with significant decompression of the spinal cord dura. Hemostasis was assured. After trial sizing a 7mm lordotic bone allograft wedge was selected and packed autograft. This was tamped into position and countersunk appropriately. Attention was the paid to the C5/6 level, where similar decompression was performed.  Uncinate spurs and central spondylitic ridges were drilled down with a high-speed drill. The spinal cord dura and both C6 nerve roots were widely decompressed. Hemostasis was assured. After trial sizing a 6 mm similar graft was selected and packed with bone autograft. This was tamped into position and countersunk appropriately.Additional autograft was packed lateral to the bone grafts at both levels. Distraction weight was removed. A 30 mm trestle luxe anterior cervical plate was affixed to the cervical spine with 12 mm variable-angle screws 2 at C5, 2 at C6 and 2 at C7. All screws were well-positioned and locking mechanisms were engaged. A final X ray was obtained which showed well positioned grafts and anterior plate without complicating features. Soft tissues were inspected and found to be in good repair. The wound was irrigated. The platysma layer was closed with 3-0 Vicryl stitches and the skin was reapproximated with 3-0   Vicryl subcuticular stitches. The wound was dressed with Dermabond.  Counts were correct at the end of the case. Patient was extubated and taken to recovery in stable and satisfactory condition.    PLAN OF CARE: Admit for overnight observation  PATIENT DISPOSITION:  PACU - hemodynamically stable.   Delay start of Pharmacological VTE agent (>24hrs) due to surgical blood loss or risk of bleeding: yes  

## 2011-11-18 NOTE — Discharge Summary (Signed)
Physician Discharge Summary  Patient ID: Rebecca [name] MRN: [id number] DOB/AGE: 1961/01/15 51 y.o.  Admit date: 11/17/2011 Discharge date: 11/18/2011  Admission Diagnoses: Cervical herniated nucleus pulposus with myelopathy, cervical stenosis, Multiple Sclerosis, cervical radiculopathy C 56, C 67     Discharge Diagnoses: Cervical herniated nucleus pulposus with myelopathy, cervical stenosis, Multiple Sclerosis, cervical radiculopathy C 56, C 67 s/p Cervical Five-Six Six-Seven Anterior cervical decompression/diskectomy/fusion with Autograft, allograft, plate C 56 and C 67 levels   Active Problems:  * No active hospital problems. *    Discharged Condition: good  Hospital Course: [name] was admitted on 11-17-11 with Dx cervical HNP with myelopathy. Following uncomplicated ACDF C5-6, C6-7, she recovered nicely in Neuro PACU and transferred to 3500 for overnight observation.   Consults: None  Significant Diagnostic Studies: radiology: X-Ray: intra-operative  Treatments: surgery: Cervical Five-Six Six-Seven Anterior cervical decompression/diskectomy/fusion with Autograft, allograft, plate C 56 and C 67 levels    Discharge Exam: Blood pressure 135/77, pulse 72, temperature 97.7 F (36.5 C), temperature source Oral, resp. rate 18, SpO2 98.00%. Alert, conversant. Family present. No pain at present. Good strength BUE. Incision without erythema, swelling, or drainage. Dermabond intact. Soft collar in use. Pleased with improvement. No longer experiencing tingling in hands & forearms or pain with neck ROM. Verbalizes understanding of expected periscapular/trapezius pain over next 6-8weeks. Verbalizes understanding of d/c instructions.    Disposition: D/C to home, self care. Rx Hydrocodone 5/325 1-2 po q4hours prn pain #60 will be called to [facility] at pt request. Pt has Baclofen at home for spasms, but will call if insufficient. She will call office to schedule 3week  visit with Dr. Venetia Maxon.       Medication List     As of 11/18/2011  7:26 AM    ASK your doctor about these medications         amantadine 100 MG capsule   Commonly known as: SYMMETREL   Take 100 mg by mouth 3 (three) times daily.      baclofen 10 MG tablet   Commonly known as: LIORESAL   Take 10-20 mg by mouth 3 (three) times daily. Take 2 tabs in AM, 1 tab at lunch, and 2 tabs at night      calcium gluconate 500 MG tablet   Take 500 mg by mouth daily.      gabapentin 300 MG capsule   Commonly known as: NEURONTIN   Take 600 mg by mouth 3 (three) times daily.      Glucosamine-Chondroitin-Vit D3 1500-1200-800 MG-MG-UNIT Pack   Take 2 tablets by mouth daily.      GRALISE 600 MG Tabs   Generic drug: Gabapentin (PHN)   Take 1,800 mg by mouth every evening.      levETIRAcetam 500 MG tablet   Commonly known as: KEPPRA   Take 500 mg by mouth every 12 (twelve) hours.      OVER THE COUNTER MEDICATION   Take 2 capsules by mouth daily. circulation & vein support supplement      oxybutynin 5 MG tablet   Commonly known as: DITROPAN   Take 5 mg by mouth 2 (two) times daily.      REBIF 44 MCG/0.5ML injection   Generic drug: interferon beta-1a   Inject 44 mcg into the skin 3 (three) times a week. Mon, Wed, Fridays         Signed: Georgiann Cocker 11/18/2011, 7:26 AM

## 2011-11-18 NOTE — Progress Notes (Signed)
Subjective: Patient reports "I feel good! Much less tingling and pressure!"  Objective: Vital signs in last 24 hours: Temp:  [97.4 F (36.3 C)-98.6 F (37 C)] 97.7 F (36.5 C) (10/11 0345) Pulse Rate:  [53-98] 72  (10/11 0345) Resp:  [11-19] 18  (10/11 0345) BP: (93-135)/(50-82) 135/77 mmHg (10/11 0345) SpO2:  [94 %-100 %] 98 % (10/11 0345)  Intake/Output from previous day: 10/10 0701 - 10/11 0700 In: 1700 [P.O.:600; I.V.:1100] Out: 200 [Emesis/NG output:150; Blood:50] Intake/Output this shift:    Alert, conversant. Family present. No pain at present. Good strength BUE. Incision without erythema, swelling, or drainage. Dermabond intact. Soft collar in use. Pleased with improvement. No longer experiencing tingling in hands & forearms or pain with neck ROM. Verbalizes understanding of expected periscapular/trapezius pain over next 6-8weeks. Verbalizes understanding of d/c instructions.   Lab Results: No results found for this basename: WBC:2,HGB:2,HCT:2,PLT:2 in the last 72 hours BMET No results found for this basename: NA:2,K:2,CL:2,CO2:2,GLUCOSE:2,BUN:2,CREATININE:2,CALCIUM:2 in the last 72 hours  Studies/Results: Dg Cervical Spine 2-3 Views  11/17/2011  *RADIOLOGY REPORT*  Clinical Data: ACDF at C5-C7.  CERVICAL SPINE - 2-3 VIEW  Comparison: MRI of the cervical spine 10/04/2011.  Findings: Two intraoperative cross-table lateral views of the cervical spine are submitted.  Film #1 demonstrates an intubated patient with surgical probes in place anteriorly at the C5-C6 and C6-C7 interspace.  Film #2 demonstrates postoperative changes of ACDF from C5-C7 with interbody graft in place at the C5-C6 and C6- C7 levels.  IMPRESSION: Intraoperative documentation of ACDF from C5-C7, as above, without immediate complicating features.   Original Report Authenticated By: Florencia Reasons, M.D.     Assessment/Plan: Improved  LOS: 1 day  Per Dr. Venetia Maxon, d/c IV, d/c to home. Rx Hydrocodone 5/325  1-2 po q4hours prn pain #60 will be called to [facility] at pt request. Pt has Baclofen at home for spasms, but will call if insufficient. She will call office to schedule 3week visit with Dr. Venetia Maxon.   Georgiann Cocker 11/18/2011, 7:21 AM

## 2011-11-18 NOTE — Progress Notes (Signed)
Doing well  DC home

## 2011-11-21 NOTE — Discharge Summary (Signed)
As above.

## 2011-11-24 ENCOUNTER — Encounter (HOSPITAL_COMMUNITY): Payer: Self-pay | Admitting: Neurosurgery

## 2011-12-14 DIAGNOSIS — M5000 Cervical disc disorder with myelopathy, unspecified cervical region: Secondary | ICD-10-CM | POA: Diagnosis not present

## 2011-12-19 ENCOUNTER — Ambulatory Visit: Payer: Self-pay | Admitting: Family Medicine

## 2011-12-19 DIAGNOSIS — R928 Other abnormal and inconclusive findings on diagnostic imaging of breast: Secondary | ICD-10-CM | POA: Diagnosis not present

## 2011-12-19 DIAGNOSIS — Z1231 Encounter for screening mammogram for malignant neoplasm of breast: Secondary | ICD-10-CM | POA: Diagnosis not present

## 2011-12-20 DIAGNOSIS — Z0000 Encounter for general adult medical examination without abnormal findings: Secondary | ICD-10-CM | POA: Diagnosis not present

## 2011-12-20 DIAGNOSIS — E559 Vitamin D deficiency, unspecified: Secondary | ICD-10-CM | POA: Diagnosis not present

## 2011-12-22 DIAGNOSIS — Z0000 Encounter for general adult medical examination without abnormal findings: Secondary | ICD-10-CM | POA: Diagnosis not present

## 2011-12-22 DIAGNOSIS — E559 Vitamin D deficiency, unspecified: Secondary | ICD-10-CM | POA: Diagnosis not present

## 2011-12-22 DIAGNOSIS — Z23 Encounter for immunization: Secondary | ICD-10-CM | POA: Diagnosis not present

## 2012-01-10 ENCOUNTER — Other Ambulatory Visit: Payer: Self-pay | Admitting: Cardiology

## 2012-01-10 DIAGNOSIS — G35 Multiple sclerosis: Secondary | ICD-10-CM | POA: Diagnosis not present

## 2012-01-10 DIAGNOSIS — R609 Edema, unspecified: Secondary | ICD-10-CM | POA: Diagnosis not present

## 2012-01-10 DIAGNOSIS — R600 Localized edema: Secondary | ICD-10-CM

## 2012-01-10 DIAGNOSIS — R238 Other skin changes: Secondary | ICD-10-CM

## 2012-01-31 DIAGNOSIS — M5000 Cervical disc disorder with myelopathy, unspecified cervical region: Secondary | ICD-10-CM | POA: Diagnosis not present

## 2012-02-06 DIAGNOSIS — E669 Obesity, unspecified: Secondary | ICD-10-CM | POA: Diagnosis not present

## 2012-02-06 DIAGNOSIS — Z0000 Encounter for general adult medical examination without abnormal findings: Secondary | ICD-10-CM | POA: Diagnosis not present

## 2012-02-06 DIAGNOSIS — Z23 Encounter for immunization: Secondary | ICD-10-CM | POA: Diagnosis not present

## 2012-02-06 DIAGNOSIS — K589 Irritable bowel syndrome without diarrhea: Secondary | ICD-10-CM | POA: Diagnosis not present

## 2012-02-08 HISTORY — PX: UPPER GI ENDOSCOPY: SHX6162

## 2012-02-08 HISTORY — PX: COLONOSCOPY: SHX174

## 2012-03-09 ENCOUNTER — Encounter (INDEPENDENT_AMBULATORY_CARE_PROVIDER_SITE_OTHER): Payer: Medicare Other

## 2012-03-09 DIAGNOSIS — M79609 Pain in unspecified limb: Secondary | ICD-10-CM | POA: Diagnosis not present

## 2012-03-09 DIAGNOSIS — K589 Irritable bowel syndrome without diarrhea: Secondary | ICD-10-CM | POA: Diagnosis not present

## 2012-03-09 DIAGNOSIS — R600 Localized edema: Secondary | ICD-10-CM

## 2012-03-09 DIAGNOSIS — R609 Edema, unspecified: Secondary | ICD-10-CM | POA: Diagnosis not present

## 2012-03-14 DIAGNOSIS — M5000 Cervical disc disorder with myelopathy, unspecified cervical region: Secondary | ICD-10-CM | POA: Diagnosis not present

## 2012-03-15 DIAGNOSIS — G35 Multiple sclerosis: Secondary | ICD-10-CM | POA: Diagnosis not present

## 2012-03-15 DIAGNOSIS — Z0000 Encounter for general adult medical examination without abnormal findings: Secondary | ICD-10-CM | POA: Diagnosis not present

## 2012-03-15 DIAGNOSIS — E559 Vitamin D deficiency, unspecified: Secondary | ICD-10-CM | POA: Diagnosis not present

## 2012-03-15 DIAGNOSIS — R609 Edema, unspecified: Secondary | ICD-10-CM | POA: Diagnosis not present

## 2012-03-26 DIAGNOSIS — R609 Edema, unspecified: Secondary | ICD-10-CM | POA: Diagnosis not present

## 2012-03-26 DIAGNOSIS — E559 Vitamin D deficiency, unspecified: Secondary | ICD-10-CM | POA: Diagnosis not present

## 2012-03-28 DIAGNOSIS — M25569 Pain in unspecified knee: Secondary | ICD-10-CM | POA: Diagnosis not present

## 2012-03-28 DIAGNOSIS — G35 Multiple sclerosis: Secondary | ICD-10-CM | POA: Diagnosis not present

## 2012-04-09 DIAGNOSIS — Z0000 Encounter for general adult medical examination without abnormal findings: Secondary | ICD-10-CM | POA: Diagnosis not present

## 2012-04-09 DIAGNOSIS — Z01419 Encounter for gynecological examination (general) (routine) without abnormal findings: Secondary | ICD-10-CM | POA: Diagnosis not present

## 2012-04-09 DIAGNOSIS — Z792 Long term (current) use of antibiotics: Secondary | ICD-10-CM | POA: Diagnosis not present

## 2012-04-09 DIAGNOSIS — Z23 Encounter for immunization: Secondary | ICD-10-CM | POA: Diagnosis not present

## 2012-04-20 ENCOUNTER — Other Ambulatory Visit: Payer: Self-pay | Admitting: Diagnostic Neuroimaging

## 2012-04-20 DIAGNOSIS — G35 Multiple sclerosis: Secondary | ICD-10-CM | POA: Diagnosis not present

## 2012-04-29 ENCOUNTER — Other Ambulatory Visit: Payer: Self-pay | Admitting: Diagnostic Neuroimaging

## 2012-05-01 ENCOUNTER — Telehealth: Payer: Self-pay | Admitting: Diagnostic Neuroimaging

## 2012-05-01 NOTE — Telephone Encounter (Signed)
Pt calling and has LM for refill of baclofen.  She is out of her medcation. Looking on chart this has been taken care of.  I called pt and let her know that the medication was reordered today and that she has 11 refills.  I went over the medication instructions with her and she confirmed this was how she takes.

## 2012-05-28 ENCOUNTER — Other Ambulatory Visit: Payer: Self-pay | Admitting: Diagnostic Neuroimaging

## 2012-05-29 ENCOUNTER — Other Ambulatory Visit: Payer: Self-pay | Admitting: Diagnostic Neuroimaging

## 2012-07-04 ENCOUNTER — Other Ambulatory Visit: Payer: Self-pay | Admitting: Diagnostic Neuroimaging

## 2012-07-09 ENCOUNTER — Other Ambulatory Visit: Payer: Self-pay | Admitting: Diagnostic Neuroimaging

## 2012-07-20 DIAGNOSIS — Z1211 Encounter for screening for malignant neoplasm of colon: Secondary | ICD-10-CM | POA: Diagnosis not present

## 2012-07-25 ENCOUNTER — Ambulatory Visit
Admission: RE | Admit: 2012-07-25 | Discharge: 2012-07-25 | Disposition: A | Discharge status: Home / Self Care | Payer: Medicare Other | Source: Ambulatory Visit | Attending: Diagnostic Neuroimaging | Admitting: Diagnostic Neuroimaging

## 2012-07-25 DIAGNOSIS — G35 Multiple sclerosis: Secondary | ICD-10-CM | POA: Diagnosis not present

## 2012-07-25 MED ORDER — GADOBENATE DIMEGLUMINE 529 MG/ML IV SOLN
16.0000 mL | Freq: Once | INTRAVENOUS | Status: AC | PRN
  Administered 2012-07-25: 16 mL via INTRAVENOUS

## 2012-08-07 ENCOUNTER — Telehealth: Payer: Self-pay | Admitting: Diagnostic Neuroimaging

## 2012-08-07 NOTE — Telephone Encounter (Signed)
Patient is calling wanting to know the results of her MRI, please advise.

## 2012-08-20 NOTE — Telephone Encounter (Signed)
MRI is stable. Continue current meds and plan. Let patient know. -VRP

## 2012-08-21 NOTE — Telephone Encounter (Signed)
Called patient and relayed information, she showed understanding .

## 2012-09-20 ENCOUNTER — Ambulatory Visit: Payer: Self-pay | Admitting: Unknown Physician Specialty

## 2012-09-20 DIAGNOSIS — Z888 Allergy status to other drugs, medicaments and biological substances status: Secondary | ICD-10-CM | POA: Diagnosis not present

## 2012-09-20 DIAGNOSIS — R609 Edema, unspecified: Secondary | ICD-10-CM | POA: Diagnosis not present

## 2012-09-20 DIAGNOSIS — Z882 Allergy status to sulfonamides status: Secondary | ICD-10-CM | POA: Diagnosis not present

## 2012-09-20 DIAGNOSIS — Z1211 Encounter for screening for malignant neoplasm of colon: Secondary | ICD-10-CM | POA: Diagnosis not present

## 2012-09-20 DIAGNOSIS — G40909 Epilepsy, unspecified, not intractable, without status epilepticus: Secondary | ICD-10-CM | POA: Diagnosis not present

## 2012-09-20 DIAGNOSIS — Z79899 Other long term (current) drug therapy: Secondary | ICD-10-CM | POA: Diagnosis not present

## 2012-09-20 DIAGNOSIS — Z8249 Family history of ischemic heart disease and other diseases of the circulatory system: Secondary | ICD-10-CM | POA: Diagnosis not present

## 2012-09-20 DIAGNOSIS — G35 Multiple sclerosis: Secondary | ICD-10-CM | POA: Diagnosis not present

## 2012-10-23 ENCOUNTER — Encounter: Payer: Self-pay | Admitting: Diagnostic Neuroimaging

## 2012-11-07 ENCOUNTER — Ambulatory Visit (INDEPENDENT_AMBULATORY_CARE_PROVIDER_SITE_OTHER): Payer: Medicare Other | Admitting: Diagnostic Neuroimaging

## 2012-11-07 ENCOUNTER — Encounter: Payer: Self-pay | Admitting: Diagnostic Neuroimaging

## 2012-11-07 VITALS — BP 122/72 | HR 58 | Temp 97.8°F | Ht 63.5 in | Wt 176.0 lb

## 2012-11-07 DIAGNOSIS — G35 Multiple sclerosis: Secondary | ICD-10-CM | POA: Diagnosis not present

## 2012-11-07 DIAGNOSIS — H538 Other visual disturbances: Secondary | ICD-10-CM

## 2012-11-07 NOTE — Patient Instructions (Addendum)
I will check MRI.   Follow up with eye doctor.

## 2012-11-07 NOTE — Progress Notes (Signed)
[facility]  PATIENT: Rebecca [name] DOB: [birth year]  REFERRING CLINICIAN:  HISTORY FROM: patient, aunt, husband REASON FOR VISIT: follow up   HISTORICAL  CHIEF COMPLAINT:  Chief Complaint  Patient presents with  . Follow-up    per EMR    HISTORY OF PRESENT ILLNESS:   UPDATE 11/07/12: Since last visit, doing well, until 1 month ago and developed fluctuating double vision/blurred vision. Affects right greater than left eye. It is present even if she closes the right or left eyes.  UPDATE 04/20/12: Since last visit, has come off gabapentin. BLE swelling stable. S/p ACDF surg per Dr. Venetia Maxon and doing well. Tolerating rebif. Has new PCP and also getting PT. Few days ago, developed some numbness in right hand, similar to pre-op symptoms which improved post-op. No neck pain.  UPDATE 01/10/12: Noticed worsening swelling to bilateral lower extremities since 01/04/12.  She is also having throbbing pain to shins. Difficulty with wearing sneakers. Unable to use leg compression machine.  Denies shortness of breath.      UPDATE 10/27/11:  Having surgery on c-spine fusion on November 17, 2011.  Concerned about recovery time and her ability to perform ADL's.  Lives with aunt.  Wanting to know if she needs to have someone come to home or stay at rehab.   Intermittent blurred vision las 3 weeks.  UPDATE 07/27/11: Doing better with shingles and post-herpetic neuralgia pain; on gralise. Now with worsening RLE weakness, fatigue, vision changes, speech diff. Discussed tx options.  UPDATE 06/02/11: On 05/27/11 developed right lower tooth pain; 4/20 developed blisters and rash on right chin and jaw. 4/22 went to dentist, diagnosed with tooth infx and given amoxicillin. Rash more significant. 06/01/11, went to dermatologist and dx'd possible shingles. Given valtrex and started on 4/24. Lesions still oozing. Has right ear and right facial pain, severe. No eye pain.  UPDATE 03/02/10: Patient was  getting worked up for first dose observation for Gilenya, however on 02/28/2011, patient had a suspected seizure. She was in the bathroom at home, stood up and without warning have loss of consciousness. Her mother found her on the floor groaning with some twitching movements of her upper extremities, and her legs outstretched in front of her. No incontinence or tongue biting. It took approximately 20-30 minutes for her to wake up. She was amnestic events until the next day.  In retrospect, patient had 4 or 5 episodes of syncope versus seizure in the early 1980s. She was never on antiseizure medication. Patient was also on Ampyra since past 2 years.  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  Last dose tysabri on 10/29/10.  She would like to switch to [place].  No MS flares.  Some increased fatigue.  Also asking if amantadine can be stopped to see if peripheral edema improves.  PRIOR HPI (11/02/10): 52 year old right-handed female with history of multiple sclerosis. Here for transfer of care from [place].  2001 - patient developed left leg numbness and weakness, followed by right facial weakness and slurred speech. She was evaluated with MRI of the brain, diagnosed with multiple sclerosis and started on Copaxone. She did not have lumbar puncture or spinal cord MRI.  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  She thinks she was off of Copaxone for approximately 2 years.  2008-9 - patient was evaluated at [facility] and started on Tysabri.  She thinks she has been on Tysabri for approximately 3-1/2 years.  Last  dose was last week at [facility].  She continues to decline in terms of mobility. She is to walk using a cane, then started using a walker, now uses a transport chair occasionally.  Patient continues to have intermittent right facial twitching and numbness. She is on amantadine, baclofen, Amprya, and oxybutynin for MS symptoms.    REVIEW  OF SYSTEMS: Full 14 system review of systems performed and notable only for murmur swelling and legs blurred vision double vision.  ALLERGIES: Allergies  Allergen Reactions  . Sulfa Antibiotics Hives and Other (See Comments)    Light headed, over heated    HOME MEDICATIONS: Outpatient Prescriptions Prior to Visit  Medication Sig Dispense Refill  . amantadine (SYMMETREL) 100 MG capsule TAKE 3 CAPSULES DAILY  270 capsule  3  . baclofen (LIORESAL) 10 MG tablet TAKE 2 TABLETS BY MOUTH IN AM THEN 1 TAB AT LUNCH AND 2 TABS AT NIGHT  150 tablet  11  . levETIRAcetam (KEPPRA) 500 MG tablet TAKE 1 TABLET BY MOUTH TWICE A DAY  60 tablet  6  . oxybutynin (DITROPAN) 5 MG tablet TAKE 1 TABLET BY MOUTH TWICE A DAY  180 tablet  3  . REBIF REBIDOSE 44 MCG/0.5ML injection Inject  44 mcg Sub-q 3 times weekly Rotate site after each injection  6 mL  6  . calcium gluconate 500 MG tablet Take 500 mg by mouth daily.      Marland Kitchen gabapentin (NEURONTIN) 300 MG capsule Take 600 mg by mouth 3 (three) times daily.      . Gabapentin, PHN, (GRALISE) 600 MG TABS Take 1,800 mg by mouth every evening.      . Glucosamine-Chondroitin-Vit D3 1500-1200-800 MG-MG-UNIT PACK Take 2 tablets by mouth daily.      Marland Kitchen OVER THE COUNTER MEDICATION Take 2 capsules by mouth daily. circulation & vein support supplement       No facility-administered medications prior to visit.    PAST MEDICAL HISTORY: Past Medical History  Diagnosis Date  . Herpes zoster   . Multiple sclerosis   . MS (multiple sclerosis)   . Heart murmur   . Closed head injury with brief loss of consciousness   . Seizures     Takes Keppra  . IBS (irritable bowel syndrome)     PAST SURGICAL HISTORY: Past Surgical History  Procedure Laterality Date  . Cholecystectomy    . Ankle surgery      Left  . Port-a-cath removal      right  . Anterior cervical decomp/discectomy fusion  11/17/2011    Procedure: ANTERIOR CERVICAL DECOMPRESSION/DISCECTOMY FUSION 2 LEVELS;   Surgeon: Maeola Harman, MD;  Location: [facility];  Service: Neurosurgery;  Laterality: N/A;  Cervical Five-Six Six-Seven Anterior cervical decompression/diskectomy/fusion    FAMILY HISTORY: Family History  Problem Relation Age of Onset  . Breast cancer Father   . Breast cancer Maternal Aunt   . Breast cancer Maternal Grandmother     SOCIAL HISTORY:  History   Social History  . Marital Status: Married    Spouse Name: don    Number of Children: 0  . Years of Education: 12   Occupational History  . disability    Social History Main Topics  . Smoking status: Never Smoker   . Smokeless tobacco: Never Used  . Alcohol Use: No  . Drug Use: No  . Sexual Activity: Yes    Partners: Male   Other Topics Concern  . Not on file   Social  History Narrative  . No narrative on file     PHYSICAL EXAM  Filed Vitals:   11/07/12 1500  BP: 122/72  Pulse: 58  Temp: 97.8 F (36.6 C)  TempSrc: Oral  Height: 5' 3.5" (1.613 m)  Weight: 176 lb (79.833 kg)    Not recorded    Body mass index is 30.68 kg/(m^2).  GENERAL EXAM: General: Patient is awake, alert and in no acute distress.  Well developed and groomed. Neck: Neck is supple. Cardiovascular: Heart is regular rate and rhythm with mild systolic murmur. PERIPHERAL EDEMA IN BLE.  Neurologic Exam  Mental Status: Awake, alert.  Language is fluent and comprehension intact. Cranial Nerves: Pupils are equal and reactive to light.  Visual fields are full to confrontation.  Conjugate eye movements are full and symmetric.  Facial sensation and strength are symmetric.  Hearing is intact.  Palate elevated symmetrically and uvula is midline.  Shoulder shrug is symmetric.  Tongue is midline. Motor: Normal bulk and tone.  Full strength in the upper extremities; DECREASED FFM IN RUE. BLE (3/5 prox, 4/5 distal), INCREASED TONE IN BLE. No pronator drift. Sensory: Intact and symmetric to light touch. Coordination: RUE > LUE DYSMETRIA. Gait  and Station: IN [place]. ABLE TO STAND, BUT CANNOT TAKE STEPS UNASSISTED. Reflexes: Deep tendon reflexes in the upper and lower extremity are present and symmetric; TRACE AT KNEES AND ANKLES.  PERIPHERAL EDEMA.   DIAGNOSTIC DATA (LABS, IMAGING, TESTING) - I reviewed patient records, labs, notes, testing and imaging myself where available.  Lab Results  Component Value Date   WBC 2.8* 11/11/2011   HGB 12.3 11/11/2011   HCT 36.2 11/11/2011   MCV 88.9 11/11/2011   PLT 104* 11/11/2011      Component Value Date/Time   NA 144 11/11/2011 1019   K 3.7 11/11/2011 1019   CL 105 11/11/2011 1019   CO2 30 11/11/2011 1019   GLUCOSE 92 11/11/2011 1019   BUN 17 11/11/2011 1019   CREATININE 0.51 11/11/2011 1019   CALCIUM 9.9 11/11/2011 1019   GFRNONAA >90 11/11/2011 1019   GFRAA >90 11/11/2011 1019   No results found for this basename: CHOL, HDL, LDLCALC, LDLDIRECT, TRIG, CHOLHDL   No results found for this basename: HGBA1C   No results found for this basename: VITAMINB12   No results found for this basename: TSH    07/25/12 MRI brain (with and without) demonstrating: 1. Multiple supratentorial and infratentorial chronic demyelinating plaques. 2. No acute plaques. 3. No change from MRI on 08/03/11.    ASSESSMENT AND PLAN  52 y.o. female with multiple sclerosis, was on Tysabri for past 3-1/2 years; now JCV +.  Last dose tysabri 10/29/10.  Switched to [place] on 04/25/11. Then with right V2, V3 herpes zoster on 05/27/11. Gilenya stopped. Shingles resolved. Now on rebif. Also s/p ACDF C5-6 (Oct 2013) for spinal stenosis.  PLAN: 1. continue rebif 2. repeat MRI now (new double/blurred vision)  Orders Placed This Encounter  Procedures  . MR Brain W Wo Contrast   Return in about 3 months (around 02/07/2013) for with Heide Guile or Caro Brundidge.    Suanne Marker, MD 11/07/2012, 3:32 PM Certified in Neurology, Neurophysiology and Neuroimaging  [facility] [address], [address] [place], [place] [address] [phone]

## 2012-11-13 DIAGNOSIS — E559 Vitamin D deficiency, unspecified: Secondary | ICD-10-CM | POA: Diagnosis not present

## 2012-11-13 DIAGNOSIS — M1990 Unspecified osteoarthritis, unspecified site: Secondary | ICD-10-CM | POA: Diagnosis not present

## 2012-11-13 DIAGNOSIS — Z23 Encounter for immunization: Secondary | ICD-10-CM | POA: Diagnosis not present

## 2012-11-13 DIAGNOSIS — G35 Multiple sclerosis: Secondary | ICD-10-CM | POA: Diagnosis not present

## 2012-11-19 DIAGNOSIS — M1990 Unspecified osteoarthritis, unspecified site: Secondary | ICD-10-CM | POA: Diagnosis not present

## 2012-11-19 DIAGNOSIS — E559 Vitamin D deficiency, unspecified: Secondary | ICD-10-CM | POA: Diagnosis not present

## 2012-11-20 ENCOUNTER — Ambulatory Visit
Admission: RE | Admit: 2012-11-20 | Discharge: 2012-11-20 | Disposition: A | Discharge status: Home / Self Care | Payer: Medicare Other | Source: Ambulatory Visit | Attending: Diagnostic Neuroimaging | Admitting: Diagnostic Neuroimaging

## 2012-11-20 DIAGNOSIS — H538 Other visual disturbances: Secondary | ICD-10-CM

## 2012-11-20 DIAGNOSIS — G35 Multiple sclerosis: Secondary | ICD-10-CM

## 2012-11-20 MED ORDER — GADOBENATE DIMEGLUMINE 529 MG/ML IV SOLN
16.0000 mL | Freq: Once | INTRAVENOUS | Status: AC | PRN
  Administered 2012-11-20: 16 mL via INTRAVENOUS

## 2012-11-22 DIAGNOSIS — H469 Unspecified optic neuritis: Secondary | ICD-10-CM | POA: Diagnosis not present

## 2012-11-22 DIAGNOSIS — G35 Multiple sclerosis: Secondary | ICD-10-CM | POA: Diagnosis not present

## 2012-11-22 DIAGNOSIS — Z79899 Other long term (current) drug therapy: Secondary | ICD-10-CM | POA: Diagnosis not present

## 2012-12-10 DIAGNOSIS — G35 Multiple sclerosis: Secondary | ICD-10-CM | POA: Diagnosis not present

## 2012-12-10 DIAGNOSIS — R262 Difficulty in walking, not elsewhere classified: Secondary | ICD-10-CM | POA: Diagnosis not present

## 2012-12-27 DIAGNOSIS — M1990 Unspecified osteoarthritis, unspecified site: Secondary | ICD-10-CM | POA: Diagnosis not present

## 2012-12-27 DIAGNOSIS — E559 Vitamin D deficiency, unspecified: Secondary | ICD-10-CM | POA: Diagnosis not present

## 2012-12-27 DIAGNOSIS — I890 Lymphedema, not elsewhere classified: Secondary | ICD-10-CM | POA: Diagnosis not present

## 2012-12-27 DIAGNOSIS — G35 Multiple sclerosis: Secondary | ICD-10-CM | POA: Diagnosis not present

## 2013-01-30 ENCOUNTER — Other Ambulatory Visit: Payer: Self-pay | Admitting: Diagnostic Neuroimaging

## 2013-02-07 HISTORY — PX: MASTECTOMY: SHX3

## 2013-02-12 DIAGNOSIS — M1990 Unspecified osteoarthritis, unspecified site: Secondary | ICD-10-CM | POA: Diagnosis not present

## 2013-02-13 ENCOUNTER — Encounter: Payer: Self-pay | Admitting: Nurse Practitioner

## 2013-02-13 ENCOUNTER — Encounter (INDEPENDENT_AMBULATORY_CARE_PROVIDER_SITE_OTHER): Payer: Self-pay

## 2013-02-13 ENCOUNTER — Ambulatory Visit (INDEPENDENT_AMBULATORY_CARE_PROVIDER_SITE_OTHER): Payer: Medicare Other | Admitting: Nurse Practitioner

## 2013-02-13 VITALS — BP 118/69 | HR 73 | Temp 97.0°F

## 2013-02-13 DIAGNOSIS — R609 Edema, unspecified: Secondary | ICD-10-CM | POA: Diagnosis not present

## 2013-02-13 DIAGNOSIS — G35 Multiple sclerosis: Secondary | ICD-10-CM | POA: Diagnosis not present

## 2013-02-13 DIAGNOSIS — R600 Localized edema: Secondary | ICD-10-CM

## 2013-02-13 DIAGNOSIS — I359 Nonrheumatic aortic valve disorder, unspecified: Secondary | ICD-10-CM | POA: Diagnosis not present

## 2013-02-13 DIAGNOSIS — I351 Nonrheumatic aortic (valve) insufficiency: Secondary | ICD-10-CM

## 2013-02-13 MED ORDER — OXYBUTYNIN CHLORIDE ER 10 MG PO TB24: 10.0000 mg | ORAL_TABLET | Freq: Every day | ORAL | Status: DC

## 2013-02-13 NOTE — Patient Instructions (Signed)
Please wear your TED hose at all times that feet are low (as when in wheelchair).  I am referring you to Cardiology for re-evaluation.  Try Oxybutniin 10 mg XR- take 1 tablet each night.  I am requesting lab work from your PCP.  You should have a CBC, LFT, BMP and TSH done each year.

## 2013-02-13 NOTE — Progress Notes (Signed)
PATIENT: Rebecca [name] DOB: February 24, 1960   REASON FOR VISIT: follow up for MS HISTORY FROM: patient  HISTORY OF PRESENT ILLNESS: UPDATE 02/13/13 (LL):  The patient returns for followup visit. Since last visit she states that she has seen Dr. Katy Fitch and that she needs new prescription eyeglasses for vision problems that the double vision resolved.  She is tolerating rebif.  Her biggest concern is that her bilateral leg edema is making it difficult for her to move her legs or ambulate.  She is not consistently wearing her TED hose. She denies any shortness of breath, but is mostly wheelchair-bound. She states that she feels like she is progressively getting weaker. She asked for information about Tecfidera. She said she had a cardiology consultation in the past when she started Gilenya, but that Dr. has since retired.  She states she was diagnosed with aortic insufficiency. Her urgency and frequency of urination is getting worse, she asked if she can be increased on her medication.  UPDATE 11/07/12: Since last visit, doing well, until 1 month ago and developed fluctuating double vision/blurred vision. Affects right greater than left eye. It is present even if she closes the right or left eyes.  UPDATE 04/20/12: Since last visit, has come off gabapentin. BLE swelling stable. S/p ACDF surg per Dr. Vertell Limber and doing well. Tolerating rebif. Has new PCP and also getting PT. Few days ago, developed some numbness in right hand, similar to pre-op symptoms which improved post-op. No neck pain.  UPDATE 01/10/12: Noticed worsening swelling to bilateral lower extremities since 01/04/12. She is also having throbbing pain to shins. Difficulty with wearing sneakers. Unable to use leg compression machine. Denies shortness of breath.  UPDATE 10/27/11: Having surgery on c-spine fusion on November 17, 2011. Concerned about recovery time and her ability to perform ADL's. Lives with aunt. Wanting to know if she needs to have someone  come to home or stay at rehab. Intermittent blurred vision las 3 weeks.  UPDATE 07/27/11: Doing better with shingles and post-herpetic neuralgia pain; on gralise. Now with worsening RLE weakness, fatigue, vision changes, speech diff. Discussed tx options.  UPDATE 06/02/11: On 05/27/11 developed right lower tooth pain; 4/20 developed blisters and rash on right chin and jaw. 4/22 went to dentist, diagnosed with tooth infx and given amoxicillin. Rash more significant. 06/01/11, went to dermatologist and dx'd possible shingles. Given valtrex and started on 4/24. Lesions still oozing. Has right ear and right facial pain, severe. No eye pain.  UPDATE 03/02/10: Patient was getting worked up for first dose observation for Gilenya, however on 02/28/2011, patient had a suspected seizure. She was in the bathroom at home, stood up and without warning have loss of consciousness. Her mother found her on the floor groaning with some twitching movements of her upper extremities, and her legs outstretched in front of her. No incontinence or tongue biting. It took approximately 20-30 minutes for her to wake up. She was amnestic events until the next day.  In retrospect, patient had 4 or 5 episodes of syncope versus seizure in the early 1980s. She was never on antiseizure medication. Patient was also on Ampyra since past 2 years.  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 Last dose tysabri on 10/29/10. She would like to switch to [place]. No MS flares. Some increased fatigue. Also asking if amantadine can be stopped to see if peripheral edema improves.  PRIOR HPI (11/02/10): 53 year old right-handed female with history of multiple sclerosis. Here for transfer of care from  [place].  2001 - patient developed left leg numbness and weakness, followed by right facial weakness and slurred speech. She was evaluated with MRI of the brain, diagnosed with multiple sclerosis and started on Copaxone. She did not have lumbar puncture or spinal  cord MRI. 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 She thinks she was off of Copaxone for approximately 2 years.  2008-9 - patient was evaluated at [facility] and started on Tysabri. She thinks she has been on Tysabri for approximately 3-1/2 years. Last dose was last week at [facility]. She continues to decline in terms of mobility. She is to walk using a cane, then started using a walker, now uses a transport chair occasionally.  Patient continues to have intermittent right facial twitching and numbness. She is on amantadine, baclofen, Amprya, and oxybutynin for MS symptoms.   REVIEW OF SYSTEMS: Full 14 system review of systems performed and notable only for chills sometimes, unexpected weight change, excessive sweating, neck stiffness, murmur swelling and legs blurred vision, restless legs, frequent waking, frequency of urination, urgency, numbness, and joint pain, aching muscles, walking difficulty.  ALLERGIES: Allergies  Allergen Reactions  . Sulfa Antibiotics Hives and Other (See Comments)    Light headed, over heated    HOME MEDICATIONS: Outpatient Prescriptions Prior to Visit  Medication Sig Dispense Refill  . amantadine (SYMMETREL) 100 MG capsule TAKE 3 CAPSULES DAILY  270 capsule  3  . baclofen (LIORESAL) 10 MG tablet TAKE 2 TABLETS BY MOUTH IN AM THEN 1 TAB AT LUNCH AND 2 TABS AT NIGHT  150 tablet  11  . levETIRAcetam (KEPPRA) 500 MG tablet TAKE 1 TABLET BY MOUTH TWICE A DAY  60 tablet  6  . naproxen (NAPROSYN) 250 MG tablet Take 500 mg by mouth as needed for mild pain.       Marland Kitchen REBIF REBIDOSE 44 MCG/0.5ML injection Inject  44 mcg Sub-q 3 times weekly Rotate site after each injection  6 mL  6  . oxybutynin (DITROPAN) 5 MG tablet TAKE 1 TABLET BY MOUTH TWICE A DAY  180 tablet  3   No facility-administered medications prior to visit.    PAST MEDICAL HISTORY: Past Medical History  Diagnosis Date  . Herpes zoster   .  Multiple sclerosis   . MS (multiple sclerosis)   . Heart murmur   . Closed head injury with brief loss of consciousness   . Seizures     Takes Keppra  . IBS (irritable bowel syndrome)     PAST SURGICAL HISTORY: Past Surgical History  Procedure Laterality Date  . Cholecystectomy    . Ankle surgery      Left  . Port-a-cath removal      right  . Anterior cervical decomp/discectomy fusion  11/17/2011    Procedure: ANTERIOR CERVICAL DECOMPRESSION/DISCECTOMY FUSION 2 LEVELS;  Surgeon: Erline Levine, MD;  Location: [facility];  Service: Neurosurgery;  Laterality: N/A;  Cervical Five-Six Six-Seven Anterior cervical decompression/diskectomy/fusion    FAMILY HISTORY: Family History  Problem Relation Age of Onset  . Breast cancer Father   . Breast cancer Maternal Aunt   . Breast cancer Maternal Grandmother     SOCIAL HISTORY: History   Social History  . Marital Status: Married    Spouse Name: don    Number of Children: 0  . Years of Education: 12   Occupational History  . disability    Social History Main Topics  .  Smoking status: Never Smoker   . Smokeless tobacco: Never Used  . Alcohol Use: No  . Drug Use: No  . Sexual Activity: Yes    Partners: Male   Other Topics Concern  . Not on file   Social History Narrative  . No narrative on file     PHYSICAL EXAM  Filed Vitals:   02/13/13 1105  BP: 118/69  Pulse: 73  Temp: 97 F (36.1 C)  TempSrc: Oral   Cannot calculate BMI with a height equal to zero.  Generalized: Well developed, in no acute distress  Head: normocephalic and atraumatic. Oropharynx benign  Neck: Supple, no carotid bruits  Cardiac: Heart is regular rate and rhythm with mild systolic murmur. PERIPHERAL EDEMA IN BLE. Musculoskeletal: No deformity   Neurological examination  Mental Status: Awake, alert. Language is fluent and comprehension intact.  Cranial Nerves: Pupils are equal and reactive to light. Visual fields are full to  confrontation. Conjugate eye movements are full and symmetric. Facial sensation and strength are symmetric. Hearing is intact. Palate elevated symmetrically and uvula is midline. Shoulder shrug is symmetric. Tongue is midline.  Motor: Normal bulk and tone. Full strength in the upper extremities; DECREASED FFM IN RUE. BLE (3/5 prox, 4/5 distal), INCREASED TONE IN BLE. No pronator drift.  Sensory: Intact and symmetric to light touch.  Coordination: RUE > LUE DYSMETRIA.  Gait and Station: [place]. ABLE TO STAND, BUT CANNOT TAKE STEPS UNASSISTED.  Reflexes: Deep tendon reflexes in the upper and lower extremity are present and symmetric; TRACE AT KNEES AND ANKLES. PERIPHERAL EDEMA.  DIAGNOSTIC DATA (LABS, IMAGING, TESTING) - I reviewed patient records, labs, notes, testing and imaging myself where available.  Lab Results  Component Value Date   WBC 2.8* 11/11/2011   HGB 12.3 11/11/2011   HCT 36.2 11/11/2011   MCV 88.9 11/11/2011   PLT 104* 11/11/2011      Component Value Date/Time   NA 144 11/11/2011 1019   K 3.7 11/11/2011 1019   CL 105 11/11/2011 1019   CO2 30 11/11/2011 1019   GLUCOSE 92 11/11/2011 1019   BUN 17 11/11/2011 1019   CREATININE 0.51 11/11/2011 1019   CALCIUM 9.9 11/11/2011 1019   GFRNONAA >90 11/11/2011 1019   GFRAA >90 11/11/2011 1019    07/25/12 MRI brain (with and without) demonstrating: 1. Multiple supratentorial and infratentorial chronic demyelinating plaques. 2. No acute plaques. 3. No change from MRI on 08/03/11.   11/20/12 MRI brain (with and without) demonstrating: Abnormal MRI scan of the brain showing multiple discrete and confluent periventricular, subcortical, corpus callosum, brainstem white matter hyperintensities consistent with chronic multiple sclerosis. No enhancing lesions are noted. Presence of T1 black holes and atrophy of corpus callosum indicates chronic disease. A small incidental right frontal venous angioma is noted. Overall no significant change  compared with MRI scan dated 07/25/2012.  BMP 02/12/13: all within normal limits.  ASSESSMENT AND PLAN 53 y.o. female with multiple sclerosis, was on Tysabri for past 3-1/2 years; now JCV +. Last dose tysabri 10/29/10. Switched to [place] on 04/25/11. Then with right V2, V3 herpes zoster on 05/27/11. Gilenya stopped. Shingles resolved. Now on rebif. Also s/p ACDF C5-6 (Oct 2013) for spinal stenosis. Patient is progressively less mobile, increased peripheral edema vs. Deconditioning from immobility.  Information packet was given for Tecfidera, asked to review and call Dr. Leta Baptist if she has questions.  PLAN:  Please wear your TED hose at all times that feet are low (as when in wheelchair).  Check bilateral LE venous dopplers for DVT. I am referring you to Cardiology for re-evaluation. Try Oxybutniin 10 mg XR- take 1 tablet each night. I am requesting lab work from your PCP.  Should have a CBC, LFT, BMP and TSH done each year.  Orders Placed This Encounter  Procedures  . Ambulatory referral to Cardiology  . Lower Extremity Venous Duplex Bilateral   Meds ordered this encounter  Medications  . oxybutynin (DITROPAN XL) 10 MG 24 hr tablet    Sig: Take 1 tablet (10 mg total) by mouth at bedtime.    Dispense:  90 tablet    Refill:  3    Order Specific Question:  Supervising Provider    Answer:  Penni Bombard [[id number]]   Return in about 6 months (around 08/13/2013) with Dr. Leta Baptist.  Philmore Pali, MSN, NP-C 02/13/2013, 1:09 PM [facility] [address] [place], [place] [address] [phone]  Note: This document was prepared with digital dictation and possible smart phrase technology. Any transcriptional errors that result from this process are unintentional.

## 2013-02-19 ENCOUNTER — Other Ambulatory Visit: Payer: Self-pay | Admitting: Diagnostic Neuroimaging

## 2013-02-20 ENCOUNTER — Ambulatory Visit: Payer: Medicare Other | Admitting: Cardiology

## 2013-02-22 ENCOUNTER — Other Ambulatory Visit: Payer: Self-pay | Admitting: Diagnostic Neuroimaging

## 2013-02-26 ENCOUNTER — Encounter (INDEPENDENT_AMBULATORY_CARE_PROVIDER_SITE_OTHER): Payer: Medicare Other

## 2013-02-26 DIAGNOSIS — M79609 Pain in unspecified limb: Secondary | ICD-10-CM

## 2013-02-26 DIAGNOSIS — R609 Edema, unspecified: Secondary | ICD-10-CM | POA: Diagnosis not present

## 2013-02-26 DIAGNOSIS — I351 Nonrheumatic aortic (valve) insufficiency: Secondary | ICD-10-CM

## 2013-02-27 ENCOUNTER — Encounter: Payer: Self-pay | Admitting: Cardiology

## 2013-02-27 ENCOUNTER — Ambulatory Visit (INDEPENDENT_AMBULATORY_CARE_PROVIDER_SITE_OTHER): Payer: Medicare Other | Admitting: Cardiology

## 2013-02-27 VITALS — BP 127/77 | HR 69 | Ht 64.0 in | Wt 185.0 lb

## 2013-02-27 DIAGNOSIS — R609 Edema, unspecified: Secondary | ICD-10-CM

## 2013-02-27 DIAGNOSIS — I359 Nonrheumatic aortic valve disorder, unspecified: Secondary | ICD-10-CM | POA: Diagnosis not present

## 2013-02-27 DIAGNOSIS — R0602 Shortness of breath: Secondary | ICD-10-CM

## 2013-02-27 DIAGNOSIS — I351 Nonrheumatic aortic (valve) insufficiency: Secondary | ICD-10-CM

## 2013-02-27 DIAGNOSIS — R600 Localized edema: Secondary | ICD-10-CM

## 2013-02-27 HISTORY — PX: OTHER SURGICAL HISTORY: SHX169

## 2013-02-27 NOTE — Patient Instructions (Signed)
Your physician has requested that you have an echocardiogram. Echocardiography is a painless test that uses sound waves to create images of your heart. It provides your doctor with information about the size and shape of your heart and how well your heart's chambers and valves are working. This procedure takes approximately one hour. There are no restrictions for this procedure.  Your physician recommends that you schedule a follow-up appointment in:  1 month after echo    Please wear SCD's 1 hr in the morning before you get up and 1 hr at bedtime.

## 2013-02-27 NOTE — Progress Notes (Signed)
PATIENT: Rebecca [name] MRN: [id number] DOB: [birth year] PCP: Nira Conn, MD  Clinic Note: Chief Complaint  Patient presents with  . other    Ref by pcp c/o edema thight/knees/legs and feet. Meds reviewed verbally with pt.    HPI: Rebecca [name] is a 53 y.o. female with a PMH below who presents today for evaluation of lower the edema and history of aortic insufficiency. She essentially has had long-standing lower extremity edema which seems to have been going on for years now. Apparently in the past he has been on Lasix her other diuretics with no real significant when able to she wears TED hose, usually related to be to put him on. She has had home sequential compression devices that she is close to where for an hour time but does not wear them today. Delayed due to a tight and sore. It does affect her ability to walk and more so than she basically has a baseline from her multiple sclerosis. She has been troubled with Vernona Rieger, edema since 2010. Prior to that she noted having lost about 80 pounds. She has not been on a diuretic for over 6 months.  On January 21 she had lower extremity Dopplers which did not show signs of DVT. She did not have peripheral venous Dopplers done to evaluate venous stasis/insufficiency..  Interval History: She tells me that she had been seen by Dr. Luberta Robertson, a cardiologist in [facility] who is now retired last year and was diagnosed with aortic insufficiency. She has a card that she was given for bacterial endocarditis protection. She is a listed diagnosis history of bacterial endocarditis although it did not elicit that from her. They edema has been since before that time frame. She does have good days and bad days and seems to been having more edema of late. Despite any edema, she is she denies any symptoms of orthopnea or PND. She denies any palpitations or rapid heartbeats.  She denies any lightheadedness or wooziness or syncope or near syncope or TIA/amaurosis fugax  symptoms.  Years ago she had an episode of syncope. This was thought to be maybe secondary to seizure as she's not had any more episodes since being on Keppra since 2012. She denies any chest tightness or pressure with rest or exertion. No melena, hematochezia or hematuria.  Past Medical History  Diagnosis Date  . Herpes zoster   . Multiple sclerosis     Walks from room to room @ home; but Wheelchair when going out.  . Closed head injury with brief loss of consciousness   . Seizures     Takes Keppra  . IBS (irritable bowel syndrome)   . Bacterial endocarditis     History of .  Marland Kitchen Aortic insufficiency   . Syncope and collapse   . Bilateral lower extremity edema     Chronic. LE Venous dopplers - negative for DVT.   Prior Cardiac Evaluation and Past Surgical History: Past Surgical History  Procedure Laterality Date  . Cholecystectomy    . Ankle surgery      Left  . Port-a-cath removal      right  . Anterior cervical decomp/discectomy fusion  11/17/2011    Procedure: ANTERIOR CERVICAL DECOMPRESSION/DISCECTOMY FUSION 2 LEVELS;  Surgeon: Maeola Harman, MD;  Location: [facility];  Service: Neurosurgery;  Laterality: N/A;  Cervical Five-Six Six-Seven Anterior cervical decompression/diskectomy/fusion    Allergies  Allergen Reactions  . Sulfa Antibiotics Hives and Other (See Comments)    Light headed, over heated  Current Outpatient Prescriptions  Medication Sig Dispense Refill  . amantadine (SYMMETREL) 100 MG capsule TAKE 3 CAPSULES DAILY  270 capsule  3  . baclofen (LIORESAL) 10 MG tablet TAKE 2 TABLETS BY MOUTH IN AM THEN 1 TAB AT LUNCH AND 2 TABS AT NIGHT  150 tablet  11  . levETIRAcetam (KEPPRA) 500 MG tablet TAKE 1 TABLET BY MOUTH TWICE A DAY  60 tablet  5  . naproxen (NAPROSYN) 250 MG tablet Take 500 mg by mouth as needed for mild pain.       Marland Kitchen oxybutynin (DITROPAN XL) 10 MG 24 hr tablet Take 1 tablet (10 mg total) by mouth at bedtime.  90 tablet  3  . REBIF REBIDOSE 44  MCG/0.5ML injection Inject  44 mcg Sub-q 3 times weekly Rotate site after each injection  6 mL  6   No current facility-administered medications for this visit.    History   Social History Narrative   She is married. Recently moved back to [place] after being in [place] for some time. She is accompanied by her husband and aunt.   Never smoked. Never used alcohol.    family history includes Breast cancer in her father, maternal aunt, and maternal grandmother; Heart attack in her father; Heart disease in her father.  ROS: A comprehensive Review of Systems - Negative except Her baseline weakness in unstable gait from multiple sclerosis.  PHYSICAL EXAM BP 127/77  Pulse 69  Ht 5\' 4"  (1.626 m)  Wt 185 lb (83.915 kg)  BMI 31.74 kg/m2 General appearance: alert, cooperative, appears stated age, no distress, mildly obese and Pleasant mood and affect. She is in a wheelchair. Well-groomed. Neck: no adenopathy, no carotid bruit, no JVD, supple, symmetrical, trachea midline and thyroid not enlarged, symmetric, no tenderness/mass/nodules Lungs: clear to auscultation bilaterally, normal percussion bilaterally and Nonlabored, good air movement. Heart: regular rate and rhythm, S1, S2 normal, systolic murmur: systolic ejection 2/6, crescendo and decrescendo at 2nd right intercostal space, diastolic murmur: early diastolic 1/6, blowing at 2nd right intercostal space and Nondisplaced PMI, no rubs or gallops Abdomen: soft, non-tender; bowel sounds normal; no masses,  no organomegaly Extremities: edema ~3+ to the thighs; she has both legs are wrapped with Ace wraps., no ulcers, gangrene or trophic changes and No varicose veins noted. The legs are somewhat tender to touch. Pulses: 2+ and symmetric But difficult to palpate. Skin: Skin color, texture, turgor normal. No rashes or lesions Neurologic: Mental status: Alert, oriented, thought content appropriate Cranial nerves: normal Motor: Definite  weakness in the legs. Moves both upper and lower extremities   ZDG:LOVFIEPPI today: Yes Rate: 69 , Rhythm: Sinus rhythm with borderline short PR. Rightward axis with a suggestion of possible RVH.;    Recent Labs: None available.  ASSESSMENT / PLAN:  middle-aged woman with multiple sclerosis and long-standing lower extremity edema. She has a diagnosis of aortic insufficiency, reportedly from Dr. Linus Salmons in [facility] who is now retired. I do not have the echocardiogram results.  Bilateral lower extremity edema This is a relatively chronic condition. Clearly not due to a venous thromboembolism. She may have some stasis, however I don't see any varicose veins or venous stasis dermatitis at this point. Perhaps a visual check lower extremity superficial venous Dopplers to look for venous insufficiency, which I was reluctant to order after she just had Dopplers done of her lower legs.  At this point, she be was reluctant to go back on diuretic. I think we need to  get manageable at results are available. I were to try wearing her compression devices for least 1 hour at night before she goes to bed followed by one hour in the morning.  If this does seem to help her swelling, then I think in her back on diuretic would help with volume removal.  I will also check a 2D echocardiogram to evaluate her pulmonary and right heart pressures. She is not having any signs or symptoms of left ventricular failure. I do not think the aortic insufficiency is related to her current issues with edema.  Aortic insufficiency She does have him physical exam findings to suggest aortic insufficiency. Unfortunately I do not have her echocardiogram result in the past, I would like to have a baseline echocardiogram on her aortic evaluate this in the future. I have asked her to the level of the thyroid disease she has would necessitate the use of endocarditis prophylaxis, however with her otherwise tenuous state having multiple  sclerosis, for now I think is fine for her to continue using prophylaxis.  SOB (shortness of breath) She'll hasn't limited episodes of dyspnea. It usually is not associated with exertion, but she does not exert herself very much and quite likely this is secondary to deconditioning. The echocardiogram will help assess for any signs or symptoms of left heart failure versus right heart failure.    Orders Placed This Encounter  Procedures  . EKG 12-Lead    Order Specific Question:  Where should this test be performed    Answer:  [facility]  . 2D Echocardiogram without contrast    Standing Status: Future     Number of Occurrences:      Standing Expiration Date: 02/27/2014    Order Specific Question:  Type of Echo    Answer:  Complete    Order Specific Question:  Where should this test be performed    Answer:  [facility]    Order Specific Question:  Reason for exam-Echo    Answer:  Aortic Valve Disorder 424.1   No orders of the defined types were placed in this encounter.    Followup: 1 months  Osher Oettinger W. Ellyn Hack, M.D., M.S. [facility] [address] [place], [place]  [address]  [phone] Pager # [phone]

## 2013-03-01 ENCOUNTER — Encounter: Payer: Self-pay | Admitting: Cardiology

## 2013-03-01 DIAGNOSIS — R0602 Shortness of breath: Secondary | ICD-10-CM

## 2013-03-01 DIAGNOSIS — R600 Localized edema: Secondary | ICD-10-CM | POA: Insufficient documentation

## 2013-03-01 DIAGNOSIS — I351 Nonrheumatic aortic (valve) insufficiency: Secondary | ICD-10-CM | POA: Insufficient documentation

## 2013-03-01 HISTORY — DX: Shortness of breath: R06.02

## 2013-03-01 NOTE — Assessment & Plan Note (Signed)
She'll hasn't limited episodes of dyspnea. It usually is not associated with exertion, but she does not exert herself very much and quite likely this is secondary to deconditioning. The echocardiogram will help assess for any signs or symptoms of left heart failure versus right heart failure.

## 2013-03-01 NOTE — Assessment & Plan Note (Signed)
This is a relatively chronic condition. Clearly not due to a venous thromboembolism. She may have some stasis, however I don't see any varicose veins or venous stasis dermatitis at this point. Perhaps a visual check lower extremity superficial venous Dopplers to look for venous insufficiency, which I was reluctant to order after she just had Dopplers done of her lower legs.  At this point, she be was reluctant to go back on diuretic. I think we need to get manageable at results are available. I were to try wearing her compression devices for least 1 hour at night before she goes to bed followed by one hour in the morning.  If this does seem to help her swelling, then I think in her back on diuretic would help with volume removal.  I will also check a 2D echocardiogram to evaluate her pulmonary and right heart pressures. She is not having any signs or symptoms of left ventricular failure. I do not think the aortic insufficiency is related to her current issues with edema.

## 2013-03-01 NOTE — Assessment & Plan Note (Signed)
>>  ASSESSMENT AND PLAN FOR BILATERAL LOWER EXTREMITY EDEMA WRITTEN ON 03/01/2013 11:00 PM BY HARDING, DAVID W, MD  This is a relatively chronic condition. Clearly not due to a venous thromboembolism. She may have some stasis, however I don't see any varicose veins or venous stasis dermatitis at this point. Perhaps a visual check lower extremity superficial venous Dopplers to look for venous insufficiency, which I was reluctant to order after she just had Dopplers done of her lower legs.  At this point, she be was reluctant to go back on diuretic. I think we need to get manageable at results are available. I were to try wearing her compression devices for least 1 hour at night before she goes to bed followed by one hour in the morning.  If this does seem to help her swelling, then I think in her back on diuretic would help with volume removal.  I will also check a 2D echocardiogram to evaluate her pulmonary and right heart pressures. She is not having any signs or symptoms of left ventricular failure. I do not think the aortic insufficiency is related to her current issues with edema.

## 2013-03-01 NOTE — Assessment & Plan Note (Signed)
She does have him physical exam findings to suggest aortic insufficiency. Unfortunately I do not have her echocardiogram result in the past, I would like to have a baseline echocardiogram on her aortic evaluate this in the future. I have asked her to the level of the thyroid disease she has would necessitate the use of endocarditis prophylaxis, however with her otherwise tenuous state having multiple sclerosis, for now I think is fine for her to continue using prophylaxis.

## 2013-03-07 ENCOUNTER — Other Ambulatory Visit: Payer: Medicare Other

## 2013-03-15 ENCOUNTER — Other Ambulatory Visit: Payer: Self-pay

## 2013-03-15 ENCOUNTER — Other Ambulatory Visit (INDEPENDENT_AMBULATORY_CARE_PROVIDER_SITE_OTHER): Payer: Medicare Other

## 2013-03-15 DIAGNOSIS — R609 Edema, unspecified: Secondary | ICD-10-CM | POA: Diagnosis not present

## 2013-03-15 DIAGNOSIS — I359 Nonrheumatic aortic valve disorder, unspecified: Secondary | ICD-10-CM | POA: Diagnosis not present

## 2013-03-15 DIAGNOSIS — R0602 Shortness of breath: Secondary | ICD-10-CM | POA: Diagnosis not present

## 2013-03-16 ENCOUNTER — Encounter: Payer: Self-pay | Admitting: Cardiology

## 2013-03-19 ENCOUNTER — Telehealth: Payer: Self-pay | Admitting: *Deleted

## 2013-03-19 NOTE — Telephone Encounter (Signed)
Called to inform patient that per Dr. Ellyn Hack "Normal LV size and function with normal ejection fraction.  The aortic valve has mild calcification with mild aortic insufficiency and mild stenosis. We will need to follow this up in about a year, mostly because her was a question of possible bicuspid aortic valve. If this truly is bicuspid, then we would need to followup more frequently as to progression would seem to be more rapid.".  No answer, left voicemail.

## 2013-03-19 NOTE — Telephone Encounter (Signed)
Discussed results with patient

## 2013-03-22 ENCOUNTER — Telehealth: Payer: Self-pay | Admitting: *Deleted

## 2013-03-22 NOTE — Telephone Encounter (Signed)
Spoke to patient. Results given and more information will be given at office appointment 04/10/13

## 2013-03-22 NOTE — Telephone Encounter (Signed)
Message copied by Raiford Simmonds on Fri Mar 22, 2013  2:10 PM ------      Message from: Leonie Man      Created: Sat Mar 16, 2013  4:52 PM       Normal LV size and function with normal ejection fraction.        The aortic valve has mild calcification with mild aortic insufficiency and mild stenosis. We will need to follow this up in about a year, mostly because her was a question of possible bicuspid aortic valve.  If this truly is bicuspid, then we would need to followup more frequently as to progression would seem to be more rapid.            Leonie Man, MD       ------

## 2013-04-10 ENCOUNTER — Ambulatory Visit (INDEPENDENT_AMBULATORY_CARE_PROVIDER_SITE_OTHER): Payer: Medicare Other | Admitting: Cardiology

## 2013-04-10 ENCOUNTER — Encounter: Payer: Self-pay | Admitting: Cardiology

## 2013-04-10 VITALS — BP 153/83 | HR 89 | Ht 64.0 in | Wt 186.2 lb

## 2013-04-10 DIAGNOSIS — I359 Nonrheumatic aortic valve disorder, unspecified: Secondary | ICD-10-CM | POA: Diagnosis not present

## 2013-04-10 DIAGNOSIS — R0602 Shortness of breath: Secondary | ICD-10-CM | POA: Diagnosis not present

## 2013-04-10 DIAGNOSIS — R600 Localized edema: Secondary | ICD-10-CM

## 2013-04-10 DIAGNOSIS — R609 Edema, unspecified: Secondary | ICD-10-CM | POA: Diagnosis not present

## 2013-04-10 DIAGNOSIS — I351 Nonrheumatic aortic (valve) insufficiency: Secondary | ICD-10-CM

## 2013-04-10 MED ORDER — SPIRONOLACTONE 25 MG PO TABS: 25.0000 mg | ORAL_TABLET | Freq: Every day | ORAL | Status: DC

## 2013-04-10 NOTE — Progress Notes (Signed)
PCP: Raye Sorrow, MD  Clinic Note: Chief Complaint  Patient presents with  . other    F/u echo c/o edema legs/ankles/feet. Meds reviewed verbally with pt.   HPI: Rebecca [name] is a 53 y.o. female with a Cardiovascular Problem List below who presents today for followup of echocardiogram done to evaluate edema. She is a very pleasant middle-aged woman with long-standing edema and history of mild aortic insufficiency. Her major medical concerns related to multiple sclerosis.  I saw her back in January for pre-significant edema, she had had mostly venous Dopplers that did not show any evidence of DVT, but did not show any signs of venous stasis or insufficiency. The overall appearance does not appear to have been related to cardiac etiology as there was no signs of left-sided heart failure with any orthopnea, there is also no significant ascites her abdominal girth increase or even JVD to suggest right-sided heart failure. However, with her history of aortic insufficiency and edema he felt it reasonable to evaluate with echocardiogram. She is now here to follow up results of her echocardiogram as noted below. Additionally, there was a consideration of possible bicuspid aortic valve noted that had not been previously reported.  Interval History: She presents today inshe still has a swelling. It goes almost all aortic mid thigh. She has no increased abdominal girth or swelling. Denies any PND orthopnea. She says the swelling so that we repeat her sore.  She is somewhat mobile but mostly wheelchair-bound. She denies any PND orthopnea. No irregular rapid heartbeats. No chest pain or shortness of breath with rest or exertion. No palpitations, lightheadedness, dizziness, weakness or syncope/near syncope. No TIA/amaurosis fugax symptoms. No melena, hematochezia hematuria.   Past Medical History  Diagnosis Date  . Herpes zoster   . Multiple sclerosis     Walks from room to room @ home; but  Wheelchair when going out.  . Closed head injury with brief loss of consciousness   . Seizures     Takes Keppra  . IBS (irritable bowel syndrome)   . Bacterial endocarditis     History of .  Marland Kitchen Aortic valve disease     Mild AS / AI (cannot exclude Bicuspid AoV)  . Syncope and collapse   . Bilateral lower extremity edema     Chronic. LE Venous dopplers - negative for DVT.    Prior Cardiac Evaluation and Past Surgical History: Past Surgical History  Procedure Laterality Date  . Cholecystectomy    . Ankle surgery      Left  . Port-a-cath removal      right  . Anterior cervical decomp/discectomy fusion  11/17/2011    Procedure: ANTERIOR CERVICAL DECOMPRESSION/DISCECTOMY FUSION 2 LEVELS;  Surgeon: Erline Levine, MD;  Location: [facility];  Service: Neurosurgery;  Laterality: N/A;  Cervical Five-Six Six-Seven Anterior cervical decompression/diskectomy/fusion  . Transthoracic echocardiogram  March 15, 2013    Normal LV size and function with EF 60-65%.; Cannot exclude bicuspid aortic valve with mild AS and mild AI.  Marland Kitchen Lower extremity venous dopplers  Feb 27, 2013    No LE DVT   MEDICATIONS AND ALLERGIES REVIEWED IN EPIC No Change in Social and Family History  ROS: A comprehensive Review of Systems -besides the symptoms noted above in history of present illness Negative except Chronic weakness in unstable gait due to multiple sclerosis.  PHYSICAL EXAM BP 153/83  Pulse 89  Ht 5\' 4"  (1.626 m)  Wt 186 lb 4 oz (84.482 kg)  BMI 31.95  kg/m2 General appearance: alert, cooperative, appears stated age, no distress and despite her BMI calculation, and do not consider her to be obese, so the weight is related to lower extremity edema. Neck: no adenopathy, no carotid bruit and no JVD Lungs: clear to auscultation bilaterally, normal percussion bilaterally and non-labored Heart: 2/6 crescendo decrescendo SEM at RUSB. Also a very soft 1/6 diastolic murmur in the same location. regular rate and  rhythm, S1, S2 normal, no click, rub or gallop; nondisplaced PMI. Abdomen: soft, non-tender; bowel sounds normal; no masses,  no organomegaly; no sign of ascites Extremities: extremities normal, atraumatic, no cyanosis, and edema that is at least 3-4+ pitting up to both thighs. No significant varicosities noted. The legs are extremely tender to touch. No signs of cellulitis. Pulses: Unable to palpate pulses in the lower extremities due to edema. Neurologic: Mental status: Alert, oriented, thought content appropriate; chronically weak and in wheelchair at baseline. Cranial nerves: normal (II-XII grossly intact)  ZWC:HENIDPOEU today: No  Recent Labs none:    ASSESSMENT / PLAN: Bilateral lower extremity edema Clearly this does not seem to be a cardiac issue. 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 I doubt there is any hepatic origin or a cardiac origin based on her current evaluation.  Plan: With the blood pressure running noted on exam today, I will start spironolactone as a low-dose diuretic. I asked that she take this shortly after sitting down to proper legs up and placed a sequential depression devices on assist with mobilization of fluid. Hoping it would just be simple diuretic he can have some effect. We may need to move to more powerful loop diuretic if this is not sufficient. In the past she has not had good luck with diuretics, therefore an choosing one that would not likely used before.    Orders Placed This Encounter  Procedures  . Basic metabolic panel    Standing Status: Future     Number of Occurrences:      Standing Expiration Date: 04/10/2014   Meds ordered this encounter  Medications  . spironolactone (ALDACTONE) 25 MG tablet    Sig: Take 1 tablet (25 mg total) by mouth daily.    Dispense:  90 tablet    Refill:  3    Followup: 2 months  DAVID W. Ellyn Hack, M.D.,  M.S. Interventional Cardiologist [facility]

## 2013-04-10 NOTE — Patient Instructions (Signed)
Your physician has recommended you make the following change in your medication:  Start Aldactone 25 mg daily    Your physician recommends that you return for lab work in: 2 weeks Basic Metabolic Panel   Your physician recommends that you schedule a follow-up appointment in:  2 months

## 2013-04-12 NOTE — Assessment & Plan Note (Signed)
>>  ASSESSMENT AND PLAN FOR BILATERAL LOWER EXTREMITY EDEMA WRITTEN ON 04/12/2013  1:10 AM BY HARDING, DAVID W, MD  Clearly this does not seem to be a cardiac issue. 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 I doubt there is any hepatic origin or a cardiac origin based on her current evaluation.  Plan: With the blood pressure running noted on exam today, I will start spironolactone as a low-dose diuretic. I asked that she take this shortly after sitting down to proper legs up and placed a sequential depression devices on assist with mobilization of fluid. Hoping it would just be simple diuretic he can have some effect. We may need to move to more powerful loop diuretic if this is not sufficient. In the past she has not had good luck with diuretics, therefore an choosing one that would not likely used before.  Will check BMP shortly after starting just to evaluate her renal function and electrolytes.

## 2013-04-12 NOTE — Assessment & Plan Note (Signed)
Very a distinct finding that would suggest some possible bicuspid morphology of aortic valve. Will definitely follow this up with a repeat echocardiogram next year. For now is not significant enough to warrant any aggressive change in therapy.  I don't think would be concerned of possible bicuspid morphology the it is unreasonable to consider using SBE antibiotic prophylaxis.

## 2013-04-12 NOTE — Assessment & Plan Note (Addendum)
Clearly this does not seem to be a cardiac issue. 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 I doubt there is any hepatic origin or a cardiac origin based on her current evaluation.  Plan: With the blood pressure running noted on exam today, I will start spironolactone as a low-dose diuretic. I asked that she take this shortly after sitting down to proper legs up and placed a sequential depression devices on assist with mobilization of fluid. Hoping it would just be simple diuretic he can have some effect. We may need to move to more powerful loop diuretic if this is not sufficient. In the past she has not had good luck with diuretics, therefore an choosing one that would not likely used before.  Will check BMP shortly after starting just to evaluate her renal function and electrolytes.

## 2013-04-18 ENCOUNTER — Other Ambulatory Visit: Payer: Self-pay | Admitting: Diagnostic Neuroimaging

## 2013-04-22 ENCOUNTER — Telehealth: Payer: Self-pay | Admitting: Diagnostic Neuroimaging

## 2013-04-22 NOTE — Telephone Encounter (Signed)
This letter likely indicates a prior Josem Kaufmann is required for the medication.  I entered info online for a secure e-pa and got a response no prior auth is needed at this time because an approval is already on file.  I called the patient back.  Relayed info provided by ins.  Patient verbalized understanding and will call us back if anything further is needed.

## 2013-04-22 NOTE — Telephone Encounter (Signed)
Patient called to state that she received a letter from [facility] stating that they will not cover her Rebif medication, please call patient and advise.

## 2013-04-25 ENCOUNTER — Other Ambulatory Visit: Payer: Medicare Other

## 2013-04-26 ENCOUNTER — Other Ambulatory Visit: Payer: Medicare Other

## 2013-05-01 ENCOUNTER — Telehealth: Payer: Self-pay

## 2013-05-01 NOTE — Telephone Encounter (Signed)
Dr. Ellyn Hack advised that if she is in pain and she thinks the situation is emergent then she should go to the ED.  Patient verbalized understanding.

## 2013-05-01 NOTE — Telephone Encounter (Signed)
Pt husband called and states both feet and legs are "super swollen", states it is getting worse. States that her skin is peeling off, and had to cut some skin to get her sock off. Pt states it is hurting very bad. Would like to talk with a nurse, before going to ED. States she does have weight gain. States skin color changes when you press on her ankle, states under band-aid it is wet.

## 2013-05-02 ENCOUNTER — Emergency Department: Payer: Self-pay | Admitting: Internal Medicine

## 2013-05-02 ENCOUNTER — Other Ambulatory Visit: Payer: Medicare Other

## 2013-05-02 DIAGNOSIS — D259 Leiomyoma of uterus, unspecified: Secondary | ICD-10-CM | POA: Diagnosis not present

## 2013-05-02 DIAGNOSIS — M79609 Pain in unspecified limb: Secondary | ICD-10-CM | POA: Diagnosis not present

## 2013-05-02 DIAGNOSIS — I890 Lymphedema, not elsewhere classified: Secondary | ICD-10-CM | POA: Diagnosis not present

## 2013-05-02 DIAGNOSIS — Z79899 Other long term (current) drug therapy: Secondary | ICD-10-CM | POA: Diagnosis not present

## 2013-05-02 DIAGNOSIS — R569 Unspecified convulsions: Secondary | ICD-10-CM | POA: Diagnosis not present

## 2013-05-02 DIAGNOSIS — R609 Edema, unspecified: Secondary | ICD-10-CM | POA: Diagnosis not present

## 2013-05-02 DIAGNOSIS — G35 Multiple sclerosis: Secondary | ICD-10-CM | POA: Diagnosis not present

## 2013-05-02 LAB — CBC
HCT: 34.7 % — ABNORMAL LOW (ref 35.0–47.0)
HGB: 11.5 g/dL — AB (ref 12.0–16.0)
MCH: 30.1 pg (ref 26.0–34.0)
MCHC: 33.2 g/dL (ref 32.0–36.0)
MCV: 91 fL (ref 80–100)
Platelet: 94 10*3/uL — ABNORMAL LOW (ref 150–440)
RBC: 3.83 10*6/uL (ref 3.80–5.20)
RDW: 12.4 % (ref 11.5–14.5)
WBC: 3.5 10*3/uL — ABNORMAL LOW (ref 3.6–11.0)

## 2013-05-02 LAB — COMPREHENSIVE METABOLIC PANEL
ALBUMIN: 4.2 g/dL (ref 3.4–5.0)
ALK PHOS: 103 U/L
Anion Gap: 3 — ABNORMAL LOW (ref 7–16)
BUN: 18 mg/dL (ref 7–18)
Bilirubin,Total: 0.5 mg/dL (ref 0.2–1.0)
CHLORIDE: 104 mmol/L (ref 98–107)
Calcium, Total: 9.5 mg/dL (ref 8.5–10.1)
Co2: 31 mmol/L (ref 21–32)
Creatinine: 0.66 mg/dL (ref 0.60–1.30)
EGFR (African American): >60
GLUCOSE: 94 mg/dL (ref 65–99)
OSMOLALITY: 277 (ref 275–301)
POTASSIUM: 4.2 mmol/L (ref 3.5–5.1)
SGOT(AST): 24 U/L (ref 15–37)
SGPT (ALT): 31 U/L (ref 12–78)
SODIUM: 138 mmol/L (ref 136–145)
TOTAL PROTEIN: 7.6 g/dL (ref 6.4–8.2)

## 2013-05-06 ENCOUNTER — Telehealth: Payer: Self-pay

## 2013-05-06 DIAGNOSIS — R600 Localized edema: Secondary | ICD-10-CM

## 2013-05-06 NOTE — Telephone Encounter (Signed)
Left voicemail letting patient know that we have her [facility] labs and will forward them to the MD.

## 2013-05-06 NOTE — Telephone Encounter (Signed)
Pt was scheduled to have labs last Thursday, but was in [facility]. Pt asks does she need to r/s that lab appt, states she had labs drawn while she was in the hospital. Please advise.

## 2013-05-07 MED ORDER — POTASSIUM CHLORIDE ER 10 MEQ PO TBCR
10.0000 meq | EXTENDED_RELEASE_TABLET | Freq: Every day | ORAL | Status: DC
Start: 2013-05-07 — End: 2013-06-12

## 2013-05-07 MED ORDER — FUROSEMIDE 40 MG PO TABS: 40.0000 mg | ORAL_TABLET | Freq: Every day | ORAL | Status: DC

## 2013-05-07 NOTE — Telephone Encounter (Signed)
Patient called and stated that she can not tolerate Aldactone  She is urinating to frequently and it is causing incontinence issues  Per patient the Aldactone has not decreased the swelling at all   Issue reviewed with Danella Sensing PA-C  He ordered patient to stop Aldactone  Start Lasix 40 mg once daily  Start potassium 20 meq daily  Order BMP next week   Patient verbalized understanding  Meds and labs ordered  Patient instructed to call Thursday 05/09/13 and let us know if the Lasix worked any better.

## 2013-05-09 ENCOUNTER — Telehealth: Payer: Self-pay | Admitting: Diagnostic Neuroimaging

## 2013-05-09 NOTE — Telephone Encounter (Signed)
Patient should f/u with PCP. -VRP

## 2013-05-09 NOTE — Telephone Encounter (Signed)
Called pt and pt stated that she had to go to the ER because her legs and feet were so swollen that the fluid was coming out of her leg and they put her on furosemide and potassium chloride and pt did not know why she was urinating like she was. I explained to the pt that the medication that was given to her was the reason why she was urinating and that she needed to call the physician that put her on this medication. Pt's next OV is on 08/13/13 with Dr. Leta Baptist. Pt wanted to know if Dr. Leta Baptist would like for her to come in sooner concerning this issue. Please advise

## 2013-05-09 NOTE — Telephone Encounter (Signed)
Called pt to inform her per Dr. Leta Baptist stated that the pt should f/u with PCP concerning this matter. Pt stated that she has an appt tomorrow. I advised the pt that if she has any other problems, questions or concerns to call the office. Pt verbalized understanding.

## 2013-05-09 NOTE — Telephone Encounter (Signed)
Pt called states she has questions concerning these medications potassium chloride (K-DUR) 10 MEQ tablet & furosemide (LASIX) 40 MG tablet and pt urinating more. Pt states she had to go to the hospital last week and they gave her different medicine and it is causing her to urinate more. Pt would like for Dr. Leta Baptist or his nurse to call her back concerning this matter. Thanks

## 2013-05-10 DIAGNOSIS — E559 Vitamin D deficiency, unspecified: Secondary | ICD-10-CM | POA: Diagnosis not present

## 2013-05-10 DIAGNOSIS — M1990 Unspecified osteoarthritis, unspecified site: Secondary | ICD-10-CM | POA: Diagnosis not present

## 2013-05-10 DIAGNOSIS — I890 Lymphedema, not elsewhere classified: Secondary | ICD-10-CM | POA: Diagnosis not present

## 2013-05-10 DIAGNOSIS — G35 Multiple sclerosis: Secondary | ICD-10-CM | POA: Diagnosis not present

## 2013-05-13 NOTE — Telephone Encounter (Signed)
That is the whole point of diuretics.  The only way to get rid of fluid is urination -- that means frequent urination.   Lasix was ineffective in the past -- the problem is that we are not dealing with a typical "cardiac" reason for edema.   We are going to have to go back to the drawing board.  Rebecca [name] W

## 2013-05-14 NOTE — Telephone Encounter (Signed)
Patients PCP stopped all diuretics and referred her to a specialist for swelling

## 2013-05-14 NOTE — Telephone Encounter (Signed)
Probably could use Bumex or Demedex.  At this point, I think the swelling is not cardiac.  Will happily defer to a "specialist".  Leonie Man, MD

## 2013-05-15 ENCOUNTER — Ambulatory Visit (INDEPENDENT_AMBULATORY_CARE_PROVIDER_SITE_OTHER): Payer: Medicare Other | Admitting: *Deleted

## 2013-05-15 DIAGNOSIS — R609 Edema, unspecified: Secondary | ICD-10-CM | POA: Diagnosis not present

## 2013-05-15 DIAGNOSIS — R600 Localized edema: Secondary | ICD-10-CM

## 2013-05-16 LAB — BASIC METABOLIC PANEL
BUN/Creatinine Ratio: 37 — ABNORMAL HIGH (ref 9–23)
BUN: 19 mg/dL (ref 6–24)
CALCIUM: 9.2 mg/dL (ref 8.7–10.2)
CO2: 29 mmol/L (ref 18–29)
CREATININE: 0.51 mg/dL — AB (ref 0.57–1.00)
Chloride: 102 mmol/L (ref 97–108)
GFR, EST AFRICAN AMERICAN: 127 mL/min/{1.73_m2} (ref >59)
GFR, EST NON AFRICAN AMERICAN: 110 mL/min/{1.73_m2} (ref >59)
GLUCOSE: 79 mg/dL (ref 65–99)
Potassium: 4.3 mmol/L (ref 3.5–5.2)
Sodium: 141 mmol/L (ref 134–144)

## 2013-05-24 ENCOUNTER — Telehealth: Payer: Self-pay | Admitting: Diagnostic Neuroimaging

## 2013-05-24 NOTE — Telephone Encounter (Signed)
Rebecca [name] w/[employer] calling regarding prior authorization for patient for medication Rebif-please call-thank you.

## 2013-05-24 NOTE — Telephone Encounter (Signed)
According to ins there is already an approval on file valid until 02/06/2014.  I called back.  Spoke with Vinnie.  He transferred me to [place].  She asked me to fax a copy of the approval letter to her at [phone].   Letter has been faxed.

## 2013-06-04 ENCOUNTER — Other Ambulatory Visit: Payer: Self-pay | Admitting: Diagnostic Neuroimaging

## 2013-06-12 ENCOUNTER — Encounter: Payer: Self-pay | Admitting: Cardiology

## 2013-06-12 ENCOUNTER — Ambulatory Visit (INDEPENDENT_AMBULATORY_CARE_PROVIDER_SITE_OTHER): Payer: Medicare Other | Admitting: Cardiology

## 2013-06-12 VITALS — BP 118/78 | HR 70 | Ht 64.0 in | Wt 189.0 lb

## 2013-06-12 DIAGNOSIS — R0602 Shortness of breath: Secondary | ICD-10-CM

## 2013-06-12 DIAGNOSIS — I359 Nonrheumatic aortic valve disorder, unspecified: Secondary | ICD-10-CM | POA: Diagnosis not present

## 2013-06-12 DIAGNOSIS — R609 Edema, unspecified: Secondary | ICD-10-CM | POA: Diagnosis not present

## 2013-06-12 DIAGNOSIS — I351 Nonrheumatic aortic (valve) insufficiency: Secondary | ICD-10-CM

## 2013-06-12 DIAGNOSIS — R600 Localized edema: Secondary | ICD-10-CM

## 2013-06-12 MED ORDER — HYDROCODONE-ACETAMINOPHEN 5-325 MG PO TABS: 1.0000 | ORAL_TABLET | Freq: Two times a day (BID) | ORAL | Status: DC | PRN

## 2013-06-12 NOTE — Patient Instructions (Signed)
Your physician has requested that you have an echocardiogram in February 2016. Echocardiography is a painless test that uses sound waves to create images of your heart. It provides your doctor with information about the size and shape of your heart and how well your heart's chambers and valves are working. This procedure takes approximately one hour. There are no restrictions for this procedure.  Your physician wants you to follow-up in: After your Echo. You will receive a reminder letter in the mail two months in advance. If you don't receive a letter, please call our office to schedule the follow-up appointment.  Your physician has recommended you make the following change in your medication:  Take 1 Vicoden tablet as needed every 12 hrs for leg pain

## 2013-06-13 ENCOUNTER — Encounter: Payer: Self-pay | Admitting: Cardiology

## 2013-06-13 NOTE — Assessment & Plan Note (Signed)
Really not that much of an issue. Most his energy deconditioning. No signs of heart failure on echo.

## 2013-06-13 NOTE — Progress Notes (Signed)
PCP: Raye Sorrow, MD  Clinic Note: Chief Complaint  Patient presents with  . other    2 month f/u c/o edema ankles/legs. Meds reviewed verbally with pt.   HPI: Rebecca [name] is a 53 y.o. female with a Cardiovascular Problem List below who presents today for significant edema. At her last visit, I had started spironolactone to see if this would help her edema.  She called back shortly after saying that she was peeing quite a bit, but not having any improvement of edema. Her PCP then stopped spironolactone. When she called in to discuss the effect of spironolactone, she was started back on Lasix which was also stopped her PCP. She tells me that as I am also inclined to believe, that her PCP believes her edema is related to her lower extremity muscle flaccidity that does not provide adequate venous return and therefore has significant stasis edema.  Despite her significant edema, she does not have any CHF symptoms of PND, orthopnea or exertional dyspnea. She is essentially wheelchair-bound. The most notable Center she has a significant pain when the edema gets bad. She actually went to the emergency room couple weeks ago for the pain and was given Vicodin. I gave her a week's supply which lasted her twice that long, but she has run out of that. She is hoping to get into see her PCP within the next couple weeks has asked if she can get his crit refill until she does see her PCP.  Otherwise her cardiac standpoint she denies any chest tightness or pressure with rest exertion. No palpitations, rapid or irregular heartbeats. No syncope/near syncope. No TIA/amaurosis fugax symptoms. No melena, hematochezia hematuria.  Past Medical History  Diagnosis Date  . Herpes zoster   . Multiple sclerosis     Walks from room to room @ home; but Wheelchair when going out.  . Closed head injury with brief loss of consciousness   . Seizures     Takes Keppra  . IBS (irritable bowel syndrome)   . Bacterial  endocarditis     History of .  Marland Kitchen Aortic valve disease     Mild AS / AI (cannot exclude Bicuspid AoV)  . Syncope and collapse   . Bilateral lower extremity edema     Chronic. LE Venous dopplers - negative for DVT.    Prior Cardiac Evaluation and Past Surgical History: Past Surgical History  Procedure Laterality Date  . Cholecystectomy    . Ankle surgery      Left  . Port-a-cath removal      right  . Anterior cervical decomp/discectomy fusion  11/17/2011    Procedure: ANTERIOR CERVICAL DECOMPRESSION/DISCECTOMY FUSION 2 LEVELS;  Surgeon: Erline Levine, MD;  Location: [facility];  Service: Neurosurgery;  Laterality: N/A;  Cervical Five-Six Six-Seven Anterior cervical decompression/diskectomy/fusion  . Transthoracic echocardiogram  March 15, 2013    Normal LV size and function with EF 60-65%.; Cannot exclude bicuspid aortic valve with mild AS and mild AI.  Marland Kitchen Lower extremity venous dopplers  Feb 27, 2013    No LE DVT    MEDICATIONS AND ALLERGIES REVIEWED IN EPIC No Change in Social and Family History  ROS: A comprehensive Review of Systems - Negative except Symptoms noted in history of present illness  PHYSICAL EXAM BP 118/78  Pulse 70  Ht 5\' 4"  (1.626 m)  Wt 189 lb (85.73 kg)  BMI 32.43 kg/m2 General appearance: A&O x 3; well rounded, well nourished. Wheelchair-bound. Her BMI is likely not  correct as most likely is related to fluid retention/edema Lungs: clear to auscultation bilaterally, normal percussion bilaterally and non-labored  Heart: RRR, normal S1-S2 ; 2/6 crescendo decrescendo SEM at RUSB. Also a very soft 1/6 diastolic murmur in the same location. nondisplaced PMI. No other rubs or gallops; no JVD Abdomen: soft, non-tender; bowel sounds normal; no masses, no organomegaly; no sign of ascites  Extremities: extremities normal, atraumatic, no cyanosis, and edema that is at least 3-4+ pitting up to both thighs. No significant varicosities noted. The legs are extremely  tender to touch. No signs of cellulitis.  Neurologic: Grossly intact  Most recent chemistries from April 8: BUN/creatinine 19/0.51, K+ 4.3 -- otherwise normal  ASSESSMENT / PLAN: Bilateral lower extremity edema Again, I do not think this is a cardiac issue. Likely related to lower extremity muscle weakness/atrophy with poor venous return. I will defer treatment of her edema to her PCP who has recommended massage therapy for fluid mobilization.  Aortic insufficiency Based on her last echocardiogram with a suggestion of possible bicuspid aortic valve, it is not unreasonable to recheck an echocardiogram in about a year just to make sure nothing is changed or gotten worse. I do think again it is reasonable to consider SBE prophylaxis simply because I do not think that she would tolerate a significant infection.  Plan: 2-D echocardiogram roughly next February; I will see her after the echo. This will be my major focus.  SOB (shortness of breath) Really not that much of an issue. Most his energy deconditioning. No signs of heart failure on echo.    Orders Placed This Encounter  Procedures  . 2D Echocardiogram without contrast    Standing Status: Future     Number of Occurrences:      Standing Expiration Date: 06/12/2014    Order Specific Question:  Type of Echo    Answer:  Complete    Order Specific Question:  Where should this test be performed    Answer:  [facility]    Order Specific Question:  Reason for exam-Echo    Answer:  Aortic Valve Disorder 424.1   Meds ordered this encounter  Medications  . HYDROcodone-acetaminophen (NORCO/VICODIN) 5-325 MG per tablet    Sig: Take 1 tablet by mouth every 12 (twelve) hours as needed for moderate pain.    Dispense:  30 tablet    Refill:  0    Followup: February/March of 2016    Cassey Hurrell W. Ellyn Hack, M.D., M.S. Interventional Cardiologist [facility]

## 2013-06-13 NOTE — Assessment & Plan Note (Addendum)
Based on her last echocardiogram with a suggestion of possible bicuspid aortic valve, it is not unreasonable to recheck an echocardiogram in about a year just to make sure nothing is changed or gotten worse. I do think again it is reasonable to consider SBE prophylaxis simply because I do not think that she would tolerate a significant infection.  Plan: 2-D echocardiogram roughly next February; I will see her after the echo. This will be my major focus.

## 2013-06-13 NOTE — Assessment & Plan Note (Signed)
Again, I do not think this is a cardiac issue. Likely related to lower extremity muscle weakness/atrophy with poor venous return. I will defer treatment of her edema to her PCP who has recommended massage therapy for fluid mobilization.

## 2013-06-13 NOTE — Assessment & Plan Note (Signed)
>>  ASSESSMENT AND PLAN FOR BILATERAL LOWER EXTREMITY EDEMA WRITTEN ON 06/13/2013 12:21 AM BY HARDING, DAVID W, MD  Again, I do not think this is a cardiac issue. Likely related to lower extremity muscle weakness/atrophy with poor venous return. I will defer treatment of her edema to her PCP who has recommended massage therapy for fluid mobilization.

## 2013-06-20 DIAGNOSIS — E559 Vitamin D deficiency, unspecified: Secondary | ICD-10-CM | POA: Diagnosis not present

## 2013-06-20 DIAGNOSIS — G35 Multiple sclerosis: Secondary | ICD-10-CM | POA: Diagnosis not present

## 2013-06-20 DIAGNOSIS — M1990 Unspecified osteoarthritis, unspecified site: Secondary | ICD-10-CM | POA: Diagnosis not present

## 2013-06-20 DIAGNOSIS — I890 Lymphedema, not elsewhere classified: Secondary | ICD-10-CM | POA: Diagnosis not present

## 2013-07-02 DIAGNOSIS — N390 Urinary tract infection, site not specified: Secondary | ICD-10-CM | POA: Diagnosis not present

## 2013-07-16 DIAGNOSIS — N390 Urinary tract infection, site not specified: Secondary | ICD-10-CM | POA: Diagnosis not present

## 2013-07-24 DIAGNOSIS — Z0000 Encounter for general adult medical examination without abnormal findings: Secondary | ICD-10-CM | POA: Diagnosis not present

## 2013-07-24 DIAGNOSIS — N390 Urinary tract infection, site not specified: Secondary | ICD-10-CM | POA: Diagnosis not present

## 2013-07-24 DIAGNOSIS — G35 Multiple sclerosis: Secondary | ICD-10-CM | POA: Diagnosis not present

## 2013-07-24 DIAGNOSIS — R569 Unspecified convulsions: Secondary | ICD-10-CM | POA: Diagnosis not present

## 2013-08-07 DIAGNOSIS — R262 Difficulty in walking, not elsewhere classified: Secondary | ICD-10-CM | POA: Diagnosis not present

## 2013-08-07 DIAGNOSIS — M6281 Muscle weakness (generalized): Secondary | ICD-10-CM | POA: Diagnosis not present

## 2013-08-07 DIAGNOSIS — G35 Multiple sclerosis: Secondary | ICD-10-CM | POA: Diagnosis not present

## 2013-08-13 ENCOUNTER — Encounter (INDEPENDENT_AMBULATORY_CARE_PROVIDER_SITE_OTHER): Payer: Self-pay

## 2013-08-13 ENCOUNTER — Encounter: Payer: Self-pay | Admitting: Diagnostic Neuroimaging

## 2013-08-13 ENCOUNTER — Ambulatory Visit (INDEPENDENT_AMBULATORY_CARE_PROVIDER_SITE_OTHER): Payer: Medicare Other | Admitting: Diagnostic Neuroimaging

## 2013-08-13 VITALS — BP 118/76 | HR 64 | Temp 97.2°F

## 2013-08-13 DIAGNOSIS — G35 Multiple sclerosis: Secondary | ICD-10-CM | POA: Diagnosis not present

## 2013-08-13 NOTE — Patient Instructions (Signed)
Try physical therapy and regular exercise to build up muscle strength.  I will check MRI scans.  Continue rebif.

## 2013-08-13 NOTE — Progress Notes (Signed)
[facility]  PATIENT: Rebecca [name] DOB: February 06, 1961  REFERRING CLINICIAN:  HISTORY FROM: patient  REASON FOR VISIT: follow up   HISTORICAL  CHIEF COMPLAINT:  Chief Complaint  Patient presents with  . Follow-up    HISTORY OF PRESENT ILLNESS:   UPDATE 08/14/13: Since last visit, has continued on rebif. BLE edema still a problem. Saw cardiology, tried diuretic, and also trying wrap/massage therapy next week. Walking and mobility gradually worsening.   UPDATE 02/13/13 (LL): The patient returns for followup visit. Since last visit she states that she has seen Dr. Katy Fitch and that she needs new prescription eyeglasses for vision problems that the double vision resolved. She is tolerating rebif. Her biggest concern is that her bilateral leg edema is making it difficult for her to move her legs or ambulate. She is not consistently wearing her TED hose. She denies any shortness of breath, but is mostly wheelchair-bound. She states that she feels like she is progressively getting weaker. She asked for information about Tecfidera. She said she had a cardiology consultation in the past when she started Gilenya, but that Dr. has since retired. She states she was diagnosed with aortic insufficiency. Her urgency and frequency of urination is getting worse, she asked if she can be increased on her medication.   UPDATE 11/07/12: Since last visit, doing well, until 1 month ago and developed fluctuating double vision/blurred vision. Affects right greater than left eye. It is present even if she closes the right or left eyes.   UPDATE 04/20/12: Since last visit, has come off gabapentin. BLE swelling stable. S/p ACDF surg per Dr. Vertell Limber and doing well. Tolerating rebif. Has new PCP and also getting PT. Few days ago, developed some numbness in right hand, similar to pre-op symptoms which improved post-op. No neck pain.   UPDATE 01/10/12: Noticed worsening swelling to bilateral lower extremities since  01/04/12. She is also having throbbing pain to shins. Difficulty with wearing sneakers. Unable to use leg compression machine. Denies shortness of breath.   UPDATE 10/27/11: Having surgery on c-spine fusion on November 17, 2011. Concerned about recovery time and her ability to perform ADL's. Lives with aunt. Wanting to know if she needs to have someone come to home or stay at rehab. Intermittent blurred vision las 3 weeks.   UPDATE 07/27/11: Doing better with shingles and post-herpetic neuralgia pain; on gralise. Now with worsening RLE weakness, fatigue, vision changes, speech diff. Discussed tx options.   UPDATE 06/02/11: On 05/27/11 developed right lower tooth pain; 4/20 developed blisters and rash on right chin and jaw. 4/22 went to dentist, diagnosed with tooth infx and given amoxicillin. Rash more significant. 06/01/11, went to dermatologist and dx'd possible shingles. Given valtrex and started on 4/24. Lesions still oozing. Has right ear and right facial pain, severe. No eye pain.   UPDATE 03/02/10: Patient was getting worked up for first dose observation for Gilenya, however on 02/28/2011, patient had a suspected seizure. She was in the bathroom at home, stood up and without warning have loss of consciousness. Her mother found her on the floor groaning with some twitching movements of her upper extremities, and her legs outstretched in front of her. No incontinence or tongue biting. It took approximately 20-30 minutes for her to wake up. She was amnestic events until the next day.  In retrospect, patient had 4 or 5 episodes of syncope versus seizure in the early 1980s. She was never on antiseizure medication. Patient was also on Ampyra since past  2 years.   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 Last dose tysabri on 10/29/10. She would like to switch to [place]. No MS flares. Some increased fatigue. Also asking if amantadine can be stopped to see if peripheral edema improves.   PRIOR HPI (11/02/10):  53 year old right-handed female with history of multiple sclerosis. Here for transfer of care from [place]. 2001 - patient developed left leg numbness and weakness, followed by right facial weakness and slurred speech. She was evaluated with MRI of the brain, diagnosed with multiple sclerosis and started on Copaxone. She did not have lumbar puncture or spinal cord MRI. 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 She thinks she was off of Copaxone for approximately 2 years. 2008-9 - patient was evaluated at [facility] and started on Tysabri. She thinks she has been on Tysabri for approximately 3-1/2 years. Last dose was last week at [facility]. She continues to decline in terms of mobility. She is to walk using a cane, then started using a walker, now uses a transport chair occasionally. Patient continues to have intermittent right facial twitching and numbness. She is on amantadine, baclofen, Amprya, and oxybutynin for MS symptoms.    REVIEW OF SYSTEMS: Full 14 system review of systems performed and notable only for fatigue ear pain leg swelling murmur restless legs aching muscles urgency frequent urination.   ALLERGIES: Allergies  Allergen Reactions  . Sulfa Antibiotics Hives and Other (See Comments)    Light headed, over heated    HOME MEDICATIONS: Outpatient Prescriptions Prior to Visit  Medication Sig Dispense Refill  . amantadine (SYMMETREL) 100 MG capsule TAKE 3 CAPSULES BY MOUTH EVERY DAY  270 capsule  3  . baclofen (LIORESAL) 10 MG tablet TAKE 2 TABLETS BY MOUTH IN THE MORNING, 1 TABLET AT LUNCH, AND 2 TABLETS AT NIGHT AS DIRECTED  150 tablet  11  . HYDROcodone-acetaminophen (NORCO/VICODIN) 5-325 MG per tablet Take 1 tablet by mouth every 12 (twelve) hours as needed for moderate pain.  30 tablet  0  . levETIRAcetam (KEPPRA) 500 MG tablet TAKE 1 TABLET BY MOUTH TWICE A DAY  60 tablet  5  . naproxen (NAPROSYN) 250 MG tablet Take 500  mg by mouth as needed for mild pain.       Marland Kitchen oxybutynin (DITROPAN XL) 10 MG 24 hr tablet Take 1 tablet (10 mg total) by mouth at bedtime.  90 tablet  3  . REBIF REBIDOSE 44 MCG/0.5ML injection Inject  44 mcg Sub-q 3 times weekly Rotate site after each injection  6 mL  6   No facility-administered medications prior to visit.    PAST MEDICAL HISTORY: Past Medical History  Diagnosis Date  . Herpes zoster   . Multiple sclerosis     Walks from room to room @ home; but Wheelchair when going out.  . Closed head injury with brief loss of consciousness   . Seizures     Takes Keppra  . IBS (irritable bowel syndrome)   . Bacterial endocarditis     History of .  Marland Kitchen Aortic valve disease     Mild AS / AI (cannot exclude Bicuspid AoV)  . Syncope and collapse   . Bilateral lower extremity edema     Chronic. LE Venous dopplers - negative for DVT.    PAST SURGICAL HISTORY: Past Surgical History  Procedure Laterality Date  . Cholecystectomy    . Ankle surgery  Left  . Port-a-cath removal      right  . Anterior cervical decomp/discectomy fusion  11/17/2011    Procedure: ANTERIOR CERVICAL DECOMPRESSION/DISCECTOMY FUSION 2 LEVELS;  Surgeon: Erline Levine, MD;  Location: [facility];  Service: Neurosurgery;  Laterality: N/A;  Cervical Five-Six Six-Seven Anterior cervical decompression/diskectomy/fusion  . Transthoracic echocardiogram  March 15, 2013    Normal LV size and function with EF 60-65%.; Cannot exclude bicuspid aortic valve with mild AS and mild AI.  Marland Kitchen Lower extremity venous dopplers  Feb 27, 2013    No LE DVT    FAMILY HISTORY: Family History  Problem Relation Age of Onset  . Breast cancer Father   . Heart disease Father   . Heart attack Father   . Breast cancer Maternal Aunt   . Breast cancer Maternal Grandmother     SOCIAL HISTORY:  History   Social History  . Marital Status: Married    Spouse Name: don    Number of Children: 0  . Years of Education: 12    Occupational History  . disability    Social History Main Topics  . Smoking status: Never Smoker   . Smokeless tobacco: Never Used  . Alcohol Use: No  . Drug Use: No  . Sexual Activity: Yes    Partners: Male   Other Topics Concern  . Not on file   Social History Narrative   She is married. Recently moved back to [place] after being in [place] for some time. She is accompanied by her husband and aunt.   Never smoked. Never used alcohol.     PHYSICAL EXAM  Filed Vitals:   08/13/13 1139  BP: 118/76  Pulse: 64  Temp: 97.2 F (36.2 C)  TempSrc: Oral    Not recorded    Cannot calculate BMI with a height equal to zero.  GENERAL EXAM:  General: Patient is awake, alert and in no acute distress. Well developed and groomed.  Neck: Neck is supple.  Cardiovascular: Heart is regular rate and rhythm with mild systolic murmur. PERIPHERAL EDEMA IN BLE.   Neurologic Exam  Mental Status: Awake, alert. Language is fluent and comprehension intact.  Cranial Nerves: Pupils are equal and reactive to light. Visual fields are full to confrontation. Conjugate eye movements are full and symmetric. Facial sensation and strength are symmetric. Hearing is intact. Palate elevated symmetrically and uvula is midline. Shoulder shrug is symmetric. Tongue is midline.  Motor: Normal bulk and tone. Full strength in the upper extremities; DECREASED FFM IN RUE. BLE (3/5 prox, 4/5 distal), INCREASED TONE IN BLE. No pronator drift.  Sensory: Intact and symmetric to light touch.  Coordination: RUE > LUE DYSMETRIA.  Gait and Station: [place]. ABLE TO STAND, BUT CANNOT TAKE STEPS UNASSISTED.  Reflexes: Deep tendon reflexes in the upper and lower extremity are present and symmetric; TRACE AT KNEES AND ANKLES. PERIPHERAL EDEMA.     DIAGNOSTIC DATA (LABS, IMAGING, TESTING) - I reviewed patient records, labs, notes, testing and imaging myself where available.  Lab Results  Component Value  Date   WBC 2.8* 11/11/2011   HGB 12.3 11/11/2011   HCT 36.2 11/11/2011   MCV 88.9 11/11/2011   PLT 104* 11/11/2011      Component Value Date/Time   NA 141 05/15/2013 1148   NA 144 11/11/2011 1019   K 4.3 05/15/2013 1148   CL 102 05/15/2013 1148   CO2 29 05/15/2013 1148   GLUCOSE 79 05/15/2013 1148   GLUCOSE 92  11/11/2011 1019   BUN 19 05/15/2013 1148   BUN 17 11/11/2011 1019   CREATININE 0.51* 05/15/2013 1148   CALCIUM 9.2 05/15/2013 1148   GFRNONAA 110 05/15/2013 1148   GFRAA 127 05/15/2013 1148   No results found for this basename: CHOL, HDL, LDLCALC, LDLDIRECT, TRIG, CHOLHDL   No results found for this basename: HGBA1C   No results found for this basename: VITAMINB12   No results found for this basename: TSH    I reviewed images myself and agree with interpretation. -VRP  06/03/11 MRI brain (with and without contrast) demonstrating: 1. Multiple supratentorial and infratentorial chronic demyelinating plaques. 2. No acute plaques are seen. 3. No significant change from MRI on 03/17/11.  08/03/11 MRI brain (with and without contrast) demonstrating: 1. Multiple supratentorial, periventricular, subcortical, juxtacortical chronic demyelinating plaques. Few T2 hyperintense foci in the cerebellum.  Some of these are hypointense on T1 views.  2. No acute demyelinating plaques. 3. In comparison to prior MRI from 06/02/11 there is no significant interval change.  08/03/11 MRI cervical spine (without contrast) demonstrating: 1. At C6-7: Disc bulging with moderate spinal stenosis (AP diameter of spinal canal is 29mm) and moderate biforaminal stenosis. 2. At C5-6: Disc bulging with mild spinal stenosis and moderate right foraminal stenosis. 3. At C3-4: Disc bulging with severe right and mild left foraminal stenosis. 4. Multiple chronic demyelinating plaques from C2 to C6.  5. In comparison to the prior MRI from 11/10/10, no change of chronic demyelinating plaques. The degenerative spine disease and spinal stenosis  have slightly increased in severity.  08/03/11 MRI thoracic spine (with and without contrast) demonstrating: 1. Multiple chronic demyelinating plaques from T2 to T12. 2. No acute demyelinating plaques. 3. Moderate spinal stenosis at C6-7.    ASSESSMENT AND PLAN  53 y.o. year old female here with multiple sclerosis, was on Tysabri in the past (3-1/2 years); then became JCV +. Last dose tysabri 10/29/10. Switched to [place] on 04/25/11. Then with right V2, V3 herpes zoster on 05/27/11. Gilenya stopped. Shingles resolved. Now on rebif. Also s/p ACDF C5-6 (Oct 2013) for spinal stenosis.   PLAN:  1. continue rebif  2. repeat MRI brain, c and t spine (eval for disease progression) 3. PT evaluation 4. Vascular surgery evaluation to remove port-a-cath  Orders Placed This Encounter  Procedures  . MR Brain W Wo Contrast  . MR Cervical Spine W Wo Contrast  . MR Thoracic Spine W Wo Contrast  . Ambulatory referral to Physical Therapy  . Ambulatory referral to Vascular Surgery   Return in about 3 months (around 11/13/2013).    Penni Bombard, MD 10/12/6385, 56:43 PM Certified in Neurology, Neurophysiology and Neuroimaging  [facility] [address] [place], [place] [address] [phone]

## 2013-08-15 DIAGNOSIS — R262 Difficulty in walking, not elsewhere classified: Secondary | ICD-10-CM | POA: Diagnosis not present

## 2013-08-15 DIAGNOSIS — M6281 Muscle weakness (generalized): Secondary | ICD-10-CM | POA: Diagnosis not present

## 2013-08-15 DIAGNOSIS — G35 Multiple sclerosis: Secondary | ICD-10-CM | POA: Diagnosis not present

## 2013-08-16 ENCOUNTER — Encounter: Payer: Self-pay | Admitting: Family Medicine

## 2013-08-16 DIAGNOSIS — IMO0001 Reserved for inherently not codable concepts without codable children: Secondary | ICD-10-CM | POA: Diagnosis not present

## 2013-08-16 DIAGNOSIS — M6281 Muscle weakness (generalized): Secondary | ICD-10-CM | POA: Diagnosis not present

## 2013-08-16 DIAGNOSIS — I890 Lymphedema, not elsewhere classified: Secondary | ICD-10-CM | POA: Diagnosis not present

## 2013-08-16 DIAGNOSIS — G35 Multiple sclerosis: Secondary | ICD-10-CM | POA: Diagnosis not present

## 2013-08-19 ENCOUNTER — Other Ambulatory Visit: Payer: Medicare Other

## 2013-08-19 DIAGNOSIS — Z87440 Personal history of urinary (tract) infections: Secondary | ICD-10-CM | POA: Diagnosis not present

## 2013-08-19 DIAGNOSIS — R011 Cardiac murmur, unspecified: Secondary | ICD-10-CM | POA: Diagnosis not present

## 2013-08-19 DIAGNOSIS — G35 Multiple sclerosis: Secondary | ICD-10-CM | POA: Diagnosis not present

## 2013-08-19 DIAGNOSIS — I890 Lymphedema, not elsewhere classified: Secondary | ICD-10-CM | POA: Diagnosis not present

## 2013-08-20 ENCOUNTER — Other Ambulatory Visit: Payer: Self-pay

## 2013-08-20 MED ORDER — INTERFERON BETA-1A 44 MCG/0.5ML ~~LOC~~ SOLN: SUBCUTANEOUS | Status: DC

## 2013-08-21 ENCOUNTER — Other Ambulatory Visit: Payer: Medicare Other

## 2013-08-21 ENCOUNTER — Ambulatory Visit
Admission: RE | Admit: 2013-08-21 | Discharge: 2013-08-21 | Disposition: A | Discharge status: Home / Self Care | Payer: Medicare Other | Source: Ambulatory Visit | Attending: Diagnostic Neuroimaging | Admitting: Diagnostic Neuroimaging

## 2013-08-21 DIAGNOSIS — G35 Multiple sclerosis: Secondary | ICD-10-CM

## 2013-08-21 MED ORDER — GADOBENATE DIMEGLUMINE 529 MG/ML IV SOLN
17.0000 mL | Freq: Once | INTRAVENOUS | Status: AC | PRN
  Administered 2013-08-21: 17 mL via INTRAVENOUS

## 2013-08-23 ENCOUNTER — Other Ambulatory Visit: Payer: Medicare Other

## 2013-08-27 ENCOUNTER — Ambulatory Visit
Admission: RE | Admit: 2013-08-27 | Discharge: 2013-08-27 | Disposition: A | Discharge status: Home / Self Care | Payer: Medicare Other | Source: Ambulatory Visit | Attending: Diagnostic Neuroimaging | Admitting: Diagnostic Neuroimaging

## 2013-08-27 ENCOUNTER — Other Ambulatory Visit: Payer: Medicare Other

## 2013-08-27 DIAGNOSIS — G35 Multiple sclerosis: Secondary | ICD-10-CM

## 2013-08-27 MED ORDER — GADOBENATE DIMEGLUMINE 529 MG/ML IV SOLN
16.0000 mL | Freq: Once | INTRAVENOUS | Status: AC | PRN
  Administered 2013-08-27: 16 mL via INTRAVENOUS

## 2013-08-29 ENCOUNTER — Other Ambulatory Visit: Payer: Self-pay | Admitting: *Deleted

## 2013-08-29 ENCOUNTER — Encounter (HOSPITAL_COMMUNITY): Payer: Self-pay | Admitting: Pharmacy Technician

## 2013-09-02 ENCOUNTER — Encounter (HOSPITAL_COMMUNITY): Payer: Self-pay | Admitting: *Deleted

## 2013-09-02 MED ORDER — DEXTROSE 5 % IV SOLN
1.5000 g | INTRAVENOUS | Status: AC
  Administered 2013-09-03: 1.5 g via INTRAVENOUS
  Filled 2013-09-02: qty 1.5

## 2013-09-03 ENCOUNTER — Ambulatory Visit (HOSPITAL_COMMUNITY): Payer: Medicare Other | Admitting: Anesthesiology

## 2013-09-03 ENCOUNTER — Ambulatory Visit (HOSPITAL_COMMUNITY): Payer: Medicare Other

## 2013-09-03 ENCOUNTER — Encounter (HOSPITAL_COMMUNITY): Payer: Medicare Other | Admitting: Anesthesiology

## 2013-09-03 ENCOUNTER — Telehealth: Payer: Self-pay | Admitting: Vascular Surgery

## 2013-09-03 ENCOUNTER — Ambulatory Visit (HOSPITAL_COMMUNITY)
Admission: RE | Admit: 2013-09-03 | Discharge: 2013-09-03 | Disposition: A | Discharge status: Home / Self Care | Payer: Medicare Other | Source: Ambulatory Visit | Attending: Vascular Surgery | Admitting: Vascular Surgery

## 2013-09-03 ENCOUNTER — Encounter (HOSPITAL_COMMUNITY): Payer: Self-pay | Admitting: Surgery

## 2013-09-03 ENCOUNTER — Encounter (HOSPITAL_COMMUNITY)
Admission: RE | Disposition: A | Discharge status: Home / Self Care | Payer: Self-pay | Source: Ambulatory Visit | Attending: Vascular Surgery

## 2013-09-03 DIAGNOSIS — Z452 Encounter for adjustment and management of vascular access device: Secondary | ICD-10-CM | POA: Diagnosis not present

## 2013-09-03 DIAGNOSIS — Z79899 Other long term (current) drug therapy: Secondary | ICD-10-CM | POA: Insufficient documentation

## 2013-09-03 DIAGNOSIS — G35 Multiple sclerosis: Secondary | ICD-10-CM | POA: Diagnosis not present

## 2013-09-03 DIAGNOSIS — R569 Unspecified convulsions: Secondary | ICD-10-CM | POA: Diagnosis not present

## 2013-09-03 DIAGNOSIS — Z9889 Other specified postprocedural states: Secondary | ICD-10-CM | POA: Diagnosis not present

## 2013-09-03 DIAGNOSIS — I890 Lymphedema, not elsewhere classified: Secondary | ICD-10-CM | POA: Diagnosis not present

## 2013-09-03 DIAGNOSIS — G35D Multiple sclerosis, unspecified: Secondary | ICD-10-CM | POA: Diagnosis not present

## 2013-09-03 DIAGNOSIS — Z882 Allergy status to sulfonamides status: Secondary | ICD-10-CM | POA: Insufficient documentation

## 2013-09-03 DIAGNOSIS — K589 Irritable bowel syndrome without diarrhea: Secondary | ICD-10-CM | POA: Diagnosis not present

## 2013-09-03 DIAGNOSIS — R918 Other nonspecific abnormal finding of lung field: Secondary | ICD-10-CM | POA: Diagnosis not present

## 2013-09-03 DIAGNOSIS — I359 Nonrheumatic aortic valve disorder, unspecified: Secondary | ICD-10-CM | POA: Insufficient documentation

## 2013-09-03 HISTORY — PX: PORT-A-CATH REMOVAL: SHX5289

## 2013-09-03 HISTORY — DX: Myoneural disorder, unspecified: G70.9

## 2013-09-03 HISTORY — DX: Lymphedema, not elsewhere classified: I89.0

## 2013-09-03 LAB — POCT I-STAT 4, (NA,K, GLUC, HGB,HCT)
GLUCOSE: 85 mg/dL (ref 70–99)
HEMATOCRIT: 40 % (ref 36.0–46.0)
Hemoglobin: 13.6 g/dL (ref 12.0–15.0)
POTASSIUM: 3.7 meq/L (ref 3.7–5.3)
SODIUM: 140 meq/L (ref 137–147)

## 2013-09-03 SURGERY — REMOVAL PORT-A-CATH
Anesthesia: Monitor Anesthesia Care | Site: Chest | Laterality: Right

## 2013-09-03 MED ORDER — 0.9 % SODIUM CHLORIDE (POUR BTL) OPTIME
TOPICAL | Status: DC | PRN
  Administered 2013-09-03: 1000 mL

## 2013-09-03 MED ORDER — CHLORHEXIDINE GLUCONATE CLOTH 2 % EX PADS: 6.0000 | MEDICATED_PAD | Freq: Once | CUTANEOUS | Status: DC

## 2013-09-03 MED ORDER — FENTANYL CITRATE 0.05 MG/ML IJ SOLN
INTRAMUSCULAR | Status: DC | PRN
  Administered 2013-09-03: 50 ug via INTRAVENOUS

## 2013-09-03 MED ORDER — FENTANYL CITRATE 0.05 MG/ML IJ SOLN
INTRAMUSCULAR | Status: AC
  Filled 2013-09-03: qty 5

## 2013-09-03 MED ORDER — PROPOFOL INFUSION 10 MG/ML OPTIME
INTRAVENOUS | Status: DC | PRN
  Administered 2013-09-03: 50 ug/kg/min via INTRAVENOUS

## 2013-09-03 MED ORDER — OXYCODONE HCL 5 MG PO TABS: 5.0000 mg | ORAL_TABLET | Freq: Once | ORAL | Status: DC | PRN

## 2013-09-03 MED ORDER — PROPOFOL 10 MG/ML IV BOLUS
INTRAVENOUS | Status: AC
  Filled 2013-09-03: qty 20

## 2013-09-03 MED ORDER — LACTATED RINGERS IV SOLN
INTRAVENOUS | Status: DC | PRN
  Administered 2013-09-03: 09:00:00 via INTRAVENOUS

## 2013-09-03 MED ORDER — LIDOCAINE-EPINEPHRINE (PF) 1 %-1:200000 IJ SOLN
INTRAMUSCULAR | Status: DC | PRN
  Administered 2013-09-03: 24 mL

## 2013-09-03 MED ORDER — LACTATED RINGERS IV SOLN
INTRAVENOUS | Status: DC
  Administered 2013-09-03: 08:00:00 via INTRAVENOUS

## 2013-09-03 MED ORDER — MIDAZOLAM HCL 2 MG/2ML IJ SOLN
INTRAMUSCULAR | Status: AC
  Filled 2013-09-03: qty 2

## 2013-09-03 MED ORDER — SODIUM CHLORIDE 0.9 % IV SOLN: INTRAVENOUS | Status: DC

## 2013-09-03 MED ORDER — OXYCODONE HCL 5 MG/5ML PO SOLN: 5.0000 mg | Freq: Once | ORAL | Status: DC | PRN

## 2013-09-03 MED ORDER — PROMETHAZINE HCL 25 MG/ML IJ SOLN
6.2500 mg | INTRAMUSCULAR | Status: DC | PRN
Start: 2013-09-03 — End: 2013-09-03

## 2013-09-03 MED ORDER — MIDAZOLAM HCL 5 MG/5ML IJ SOLN
INTRAMUSCULAR | Status: DC | PRN
  Administered 2013-09-03: 1 mg via INTRAVENOUS

## 2013-09-03 MED ORDER — ACETAMINOPHEN-CODEINE #3 300-30 MG PO TABS: 1.0000 | ORAL_TABLET | ORAL | Status: DC | PRN

## 2013-09-03 MED ORDER — FENTANYL CITRATE 0.05 MG/ML IJ SOLN
25.0000 ug | INTRAMUSCULAR | Status: DC | PRN
Start: 2013-09-03 — End: 2013-09-03

## 2013-09-03 SURGICAL SUPPLY — 48 items
BAG DECANTER FOR FLEXI CONT (MISCELLANEOUS) ×2 IMPLANT
BLADE 10 SAFETY STRL DISP (BLADE) ×2 IMPLANT
CATH CANNON HEMO 15F 50CM (CATHETERS) IMPLANT
CATH CANNON HEMO 15FR 19 (HEMODIALYSIS SUPPLIES) IMPLANT
CATH CANNON HEMO 15FR 23CM (HEMODIALYSIS SUPPLIES) IMPLANT
CATH CANNON HEMO 15FR 31CM (HEMODIALYSIS SUPPLIES) IMPLANT
CATH CANNON HEMO 15FR 32CM (HEMODIALYSIS SUPPLIES) IMPLANT
CATH STRAIGHT 5FR 65CM (CATHETERS) IMPLANT
COVER PROBE W GEL 5X96 (DRAPES) IMPLANT
COVER SURGICAL LIGHT HANDLE (MISCELLANEOUS) ×2 IMPLANT
DERMABOND ADHESIVE PROPEN (GAUZE/BANDAGES/DRESSINGS) ×1
DERMABOND ADVANCED (GAUZE/BANDAGES/DRESSINGS)
DERMABOND ADVANCED .7 DNX12 (GAUZE/BANDAGES/DRESSINGS) IMPLANT
DERMABOND ADVANCED .7 DNX6 (GAUZE/BANDAGES/DRESSINGS) ×1 IMPLANT
DRAPE C-ARM 42X72 X-RAY (DRAPES) ×2 IMPLANT
DRAPE CHEST BREAST 15X10 FENES (DRAPES) ×2 IMPLANT
GAUZE SPONGE 2X2 8PLY STRL LF (GAUZE/BANDAGES/DRESSINGS) ×1 IMPLANT
GAUZE SPONGE 4X4 16PLY XRAY LF (GAUZE/BANDAGES/DRESSINGS) ×2 IMPLANT
GLOVE BIO SURGEON STRL SZ7 (GLOVE) ×2 IMPLANT
GLOVE BIOGEL PI IND STRL 7.0 (GLOVE) ×2 IMPLANT
GLOVE BIOGEL PI IND STRL 7.5 (GLOVE) ×1 IMPLANT
GLOVE BIOGEL PI INDICATOR 7.0 (GLOVE) ×2
GLOVE BIOGEL PI INDICATOR 7.5 (GLOVE) ×1
GOWN STRL REUS W/ TWL LRG LVL3 (GOWN DISPOSABLE) ×2 IMPLANT
GOWN STRL REUS W/TWL LRG LVL3 (GOWN DISPOSABLE) ×2
KIT BASIN OR (CUSTOM PROCEDURE TRAY) ×2 IMPLANT
KIT ROOM TURNOVER OR (KITS) ×2 IMPLANT
NEEDLE 18GX1X1/2 (RX/OR ONLY) (NEEDLE) IMPLANT
NEEDLE HYPO 25GX1X1/2 BEV (NEEDLE) ×2 IMPLANT
NS IRRIG 1000ML POUR BTL (IV SOLUTION) ×2 IMPLANT
PACK SURGICAL SETUP 50X90 (CUSTOM PROCEDURE TRAY) ×2 IMPLANT
PAD ARMBOARD 7.5X6 YLW CONV (MISCELLANEOUS) ×4 IMPLANT
SET MICROPUNCTURE 5F STIFF (MISCELLANEOUS) IMPLANT
SOAP 2 % CHG 4 OZ (WOUND CARE) ×2 IMPLANT
SPONGE GAUZE 2X2 STER 10/PKG (GAUZE/BANDAGES/DRESSINGS) ×1
SUT ETHILON 3 0 PS 1 (SUTURE) IMPLANT
SUT MNCRL AB 4-0 PS2 18 (SUTURE) ×2 IMPLANT
SUT VIC AB 3-0 SH 27 (SUTURE) ×1
SUT VIC AB 3-0 SH 27X BRD (SUTURE) ×1 IMPLANT
SYR 20CC LL (SYRINGE) IMPLANT
SYR 3ML LL SCALE MARK (SYRINGE) IMPLANT
SYR 5ML LL (SYRINGE) IMPLANT
SYR CONTROL 10ML LL (SYRINGE) ×2 IMPLANT
SYRINGE 10CC LL (SYRINGE) IMPLANT
TOWEL OR 17X24 6PK STRL BLUE (TOWEL DISPOSABLE) ×2 IMPLANT
TOWEL OR 17X26 10 PK STRL BLUE (TOWEL DISPOSABLE) ×2 IMPLANT
WATER STERILE IRR 1000ML POUR (IV SOLUTION) IMPLANT
WIRE AMPLATZ SS-J .035X180CM (WIRE) IMPLANT

## 2013-09-03 NOTE — Op Note (Signed)
    OPERATIVE NOTE   PROCEDURE: 1. Removal of right chest mediport  PRE-OPERATIVE DIAGNOSIS: Right chest mediport for venous access  POST-OPERATIVE DIAGNOSIS: same as above   SURGEON: Adele Barthel, MD  ANESTHESIA: local and MAC  ESTIMATED BLOOD LOSS: 50 cc  FINDING(S): 1. Intact catheter with intact mediport  SPECIMEN(S):  none  INDICATIONS:   Rebecca [name] is a 53 y.o. female who presents with right chest mediport previously used for her multiple sclerosis medications.  At this point, she is no longer using the mediport, so her neurologist recommended removal of the mediport.  Risks include but are not limited to: bleeding, infection, disrupted catheter, and inability to remove central cannula.  DESCRIPTION: After obtaining full informed written consent, the patient was brought back to the operating room and placed supine upon the operating table.  The patient received IV antibiotics prior to induction.  After obtaining adequate anesthesia, the patient was prepped and draped in the standard fashion for: right chest mediport removal.  I injected 25 cc of 1% lidocaine with epinephrine in a field fashion around the mediport.  I made an incision over the mediport.  I dissected out the mediport and removed it with the central catheter.  Both were completely intact.  I held pressure to neck for 3 minutes, no further bleeding was present.  I reapproximated the subcutaneous tissue in a double layer with 3-0 Vicryl.  The skin was reapproximated with a subcuticular stitch of 4-0 Monocryl.  The skin was cleaned, dried, and reinforced with Dermabond.    COMPLICATIONS: none  CONDITION: stable  Adele Barthel, MD [facility] Office: [phone] Pager: [phone]  09/03/2013, 10:21 AM

## 2013-09-03 NOTE — Telephone Encounter (Signed)
Message copied by Gena Fray on Tue Sep 03, 2013  2:55 PM ------      Message from: Denman George      Created: Tue Sep 03, 2013 11:06 AM      Regarding: Dayton Martes / also needs 2 wk. f/u w/ [facility]                   ----- Message -----         From: Conrad Lawndale, MD         Sent: 09/03/2013  10:27 AM           To: [facility] [address]            Rebecca [name]      [id number]      1961/01/30                  Procedure: Removal of right chest mediport            Follow-up: 2 weeks ------

## 2013-09-03 NOTE — Transfer of Care (Signed)
Immediate Anesthesia Transfer of Care Note  Patient: Rebecca [name]  Procedure(s) Performed: Procedure(s): REMOVAL PORT-A-CATH (Right)  Patient Location: PACU  Anesthesia Type:MAC  Level of Consciousness: awake, alert  and oriented  Airway & Oxygen Therapy: Patient Spontanous Breathing and Patient connected to nasal cannula oxygen  Post-op Assessment: Report given to PACU RN  Post vital signs: Reviewed and stable  Complications: No apparent anesthesia complications

## 2013-09-03 NOTE — Anesthesia Postprocedure Evaluation (Signed)
  Anesthesia Post-op Note  Patient: Rebecca [name]  Procedure(s) Performed: Procedure(s): REMOVAL PORT-A-CATH (Right)  Patient Location: PACU  Anesthesia Type:MAC  Level of Consciousness: awake and alert   Airway and Oxygen Therapy: Patient Spontanous Breathing  Post-op Pain: none  Post-op Assessment: Post-op Vital signs reviewed  Post-op Vital Signs: stable  Last Vitals:  Filed Vitals:   09/03/13 1026  BP:   Pulse:   Temp: 36.4 C  Resp:     Complications: No apparent anesthesia complications

## 2013-09-03 NOTE — H&P (Signed)
History of Present Illness  Rebecca [name] is a 53 y.o. (December 15, 1960) female who presents with chief complaint: mediport.  Pt has multiple sclerosis and previously has had difficulty with venous access.  She has not used her mediport >1 years so at this point, her neurologist would like the mediport removed.  The patient denies any fever or chills, denies any prior port infection, and denies any anticoagulation.  Past Medical History  Diagnosis Date  . Herpes zoster   . Multiple sclerosis     Walks from room to room @ home; but Wheelchair when going out.  . Closed head injury with brief loss of consciousness   . Seizures     Takes Keppra  . IBS (irritable bowel syndrome)     no longer  . Bacterial endocarditis     History of .  Marland Kitchen Aortic valve disease     Mild AS / AI (cannot exclude Bicuspid AoV)  . Syncope and collapse   . Bilateral lower extremity edema     Chronic. LE Venous dopplers - negative for DVT.  Marland Kitchen Neuromuscular disorder     MS  . Heart murmur   . Lymphedema     [facility]    Past Surgical History  Procedure Laterality Date  . Cholecystectomy    . Ankle surgery      Left  . Port-a-cath removal      right  . Anterior cervical decomp/discectomy fusion  11/17/2011    Procedure: ANTERIOR CERVICAL DECOMPRESSION/DISCECTOMY FUSION 2 LEVELS;  Surgeon: Erline Levine, MD;  Location: [facility];  Service: Neurosurgery;  Laterality: N/A;  Cervical Five-Six Six-Seven Anterior cervical decompression/diskectomy/fusion  . Transthoracic echocardiogram  March 15, 2013    Normal LV size and function with EF 60-65%.; Cannot exclude bicuspid aortic valve with mild AS and mild AI.  Marland Kitchen Lower extremity venous dopplers  Feb 27, 2013    No LE DVT  . Port a cath insertion Right 01/19/2010  . Colonoscopy  2014  . Upper gi endoscopy  2014    History   Social History  . Marital Status: Married    Spouse Name: don    Number of Children: 0  . Years of Education: 12    Occupational History  . disability    Social History Main Topics  . Smoking status: Never Smoker   . Smokeless tobacco: Never Used  . Alcohol Use: No  . Drug Use: No  . Sexual Activity: Yes    Partners: Male   Other Topics Concern  . Not on file   Social History Narrative   She is married. Recently moved back to [place] after being in [place] for some time. She is accompanied by her husband and aunt.   Never smoked. Never used alcohol.    Family History  Problem Relation Age of Onset  . Breast cancer Father   . Heart disease Father   . Heart attack Father   . Breast cancer Maternal Aunt   . Breast cancer Maternal Grandmother     No current facility-administered medications on file prior to encounter.   Current Outpatient Prescriptions on File Prior to Encounter  Medication Sig Dispense Refill  . HYDROcodone-acetaminophen (NORCO/VICODIN) 5-325 MG per tablet Take 1 tablet by mouth every 12 (twelve) hours as needed for moderate pain.  30 tablet  0  . interferon beta-1a (REBIF REBIDOSE) 44 MCG/0.5ML injection Inject  44 mcg Sub-q 3 times weekly Rotate site after  each injection  6 mL  12  . naproxen (NAPROSYN) 250 MG tablet Take 500 mg by mouth as needed for mild pain.       Marland Kitchen oxybutynin (DITROPAN XL) 10 MG 24 hr tablet Take 1 tablet (10 mg total) by mouth at bedtime.  90 tablet  3    Allergies  Allergen Reactions  . Sulfa Antibiotics Hives and Other (See Comments)    Light headed, over heated    REVIEW OF SYSTEMS:  (Positives checked otherwise negative)  CARDIOVASCULAR:  []  chest pain, []  chest pressure, []  palpitations, []  shortness of breath when laying flat, []  shortness of breath with exertion,  []  pain in feet when walking, []  pain in feet when laying flat, []  history of blood clot in veins (DVT), []  history of phlebitis, []  swelling in legs, []  varicose veins  PULMONARY:  []  productive cough, []  asthma, []  wheezing  NEUROLOGIC:  []  weakness in arms or  legs, []  numbness in arms or legs, []  difficulty speaking or slurred speech, []  temporary loss of vision in one eye, []  dizziness, [x]  MS  HEMATOLOGIC:  []  bleeding problems, []  problems with blood clotting too easily  MUSCULOSKEL:  []  joint pain, []  joint swelling  GASTROINTEST:  []  vomiting blood, []  blood in stool     GENITOURINARY:  []  burning with urination, []  blood in urine  PSYCHIATRIC:  []  history of major depression  INTEGUMENTARY:  []  rashes, []  ulcers  CONSTITUTIONAL:  []  fever, []  chills  Physical Examination  Filed Vitals:   09/03/13 0756  BP: 133/66  Pulse: 60  Temp: 97.8 F (36.6 C)  TempSrc: Oral  Resp: 18  SpO2: 99%    There is no weight on file to calculate BMI.  General: A&O x 3, WDWN  Head: [place]/AT  Ear/Nose/Throat: Hearing grossly intact, nares w/o erythema or drainage, oropharynx w/o Erythema/Exudate, Mallampati score: 3  Eyes: PERRLA, EOMI  Neck: Supple, no nuchal rigidity, no palpable LAD  Pulmonary: Sym exp, good air movt, CTAB, no rales, rhonchi, & wheezing; palpable R chest mediport with visible central cannula  Cardiac: RRR, Nl S1, S2, no Murmurs, rubs or gallops  Vascular: palpable brachial and radial pulses  Gastrointestinal: soft, NTND, -G/R, - HSM, - masses, - CVAT B  Musculoskeletal: M/S 5/5 throughout , Extremities without ischemic changes   Neurologic: CN 2-12 intact , Pain and light touch intact in extremities , Motor exam as listed above  Psychiatric: Judgment intact, Mood & affect appropriate for pt's clinical situation  Dermatologic: See M/S exam for extremity exam, no rashes otherwise noted  Lymph : No Cervical, Axillary, or Inguinal lymphadenopathy   Laboratory: CBC:    Component Value Date/Time   WBC 2.8* 11/11/2011 1019   RBC 4.07 11/11/2011 1019   HGB 13.6 09/03/2013 0803   HCT 40.0 09/03/2013 0803   PLT 104* 11/11/2011 1019   MCV 88.9 11/11/2011 1019   MCH 30.2 11/11/2011 1019   MCHC 34.0 11/11/2011 1019   RDW  12.6 11/11/2011 1019    BMP:    Component Value Date/Time   NA 140 09/03/2013 0803   NA 141 05/15/2013 1148   K 3.7 09/03/2013 0803   CL 102 05/15/2013 1148   CO2 29 05/15/2013 1148   GLUCOSE 85 09/03/2013 0803   GLUCOSE 79 05/15/2013 1148   BUN 19 05/15/2013 1148   BUN 17 11/11/2011 1019   CREATININE 0.51* 05/15/2013 1148   CALCIUM 9.2 05/15/2013 1148   GFRNONAA 110 05/15/2013 1148  GFRAA 127 05/15/2013 1148    Coagulation: No results found for this basename: INR, PROTIME   No results found for this basename: PTT    Medical Decision Making  Rebecca [name] is a 53 y.o. female who presents with: mediport, multiple sclerosis.   Patient is scheduled for mediport removal.  Risks include but are not limited to: bleeding, infection, disrupted catheter, and inability to remove central cannula.  Thank you for allowing [place] to participate in this patient's care.  Adele Barthel, MD [facility] Office: [phone] Pager: [phone]  09/03/2013, 7:30 AM

## 2013-09-03 NOTE — Anesthesia Preprocedure Evaluation (Signed)
Anesthesia Evaluation  Patient identified by MRN, date of birth, ID band Patient awake    Airway Mallampati: I  Neck ROM: Full    Dental   Pulmonary  breath sounds clear to auscultation        Cardiovascular + Valvular Problems/Murmurs Rhythm:Regular Rate:Normal     Neuro/Psych Seizures -,  Multiple sclerossis  Neuromuscular disease    GI/Hepatic   Endo/Other    Renal/GU      Musculoskeletal Edema legs   Abdominal   Peds  Hematology   Anesthesia Other Findings   Reproductive/Obstetrics                           Anesthesia Physical Anesthesia Plan  ASA: III  Anesthesia Plan: MAC   Post-op Pain Management:    Induction: Intravenous  Airway Management Planned: Natural Airway and Simple Face Mask  Additional Equipment:   Intra-op Plan:   Post-operative Plan:   Informed Consent: I have reviewed the patients History and Physical, chart, labs and discussed the procedure including the risks, benefits and alternatives for the proposed anesthesia with the patient or authorized representative who has indicated his/her understanding and acceptance.     Plan Discussed with:   Anesthesia Plan Comments:         Anesthesia Quick Evaluation

## 2013-09-03 NOTE — Telephone Encounter (Signed)
Lm for pt re appt, dpm  °

## 2013-09-03 NOTE — Discharge Instructions (Signed)

## 2013-09-04 ENCOUNTER — Encounter (HOSPITAL_COMMUNITY): Payer: Self-pay | Admitting: Vascular Surgery

## 2013-09-06 ENCOUNTER — Telehealth: Payer: Self-pay | Admitting: Diagnostic Neuroimaging

## 2013-09-06 NOTE — Telephone Encounter (Signed)
Patient calling for MRI results.  Please call anytime and can leave message if not available.  Thanks

## 2013-09-06 NOTE — Telephone Encounter (Signed)
Let patient know that imaging is stable. No new MS plaques. Continue current plan. -VRP

## 2013-09-07 ENCOUNTER — Encounter: Payer: Self-pay | Admitting: Family Medicine

## 2013-09-07 DIAGNOSIS — I890 Lymphedema, not elsewhere classified: Secondary | ICD-10-CM | POA: Diagnosis not present

## 2013-09-07 DIAGNOSIS — M6281 Muscle weakness (generalized): Secondary | ICD-10-CM | POA: Diagnosis not present

## 2013-09-07 DIAGNOSIS — IMO0001 Reserved for inherently not codable concepts without codable children: Secondary | ICD-10-CM | POA: Diagnosis not present

## 2013-09-09 NOTE — Telephone Encounter (Signed)
Called patient and informed

## 2013-09-18 ENCOUNTER — Encounter: Payer: Self-pay | Admitting: Vascular Surgery

## 2013-09-19 ENCOUNTER — Encounter: Payer: Self-pay | Admitting: Vascular Surgery

## 2013-09-19 ENCOUNTER — Ambulatory Visit (INDEPENDENT_AMBULATORY_CARE_PROVIDER_SITE_OTHER): Payer: Medicare Other | Admitting: Vascular Surgery

## 2013-09-19 VITALS — BP 106/60 | HR 62 | Ht 64.0 in | Wt 189.0 lb

## 2013-09-19 DIAGNOSIS — G35 Multiple sclerosis: Secondary | ICD-10-CM

## 2013-09-19 NOTE — Progress Notes (Signed)
    Postoperative Access Visit   History of Present Illness  Rebecca [name] is a 53 y.o. year old female who presents for postoperative follow-up for: removal R chest mediport (Date: 09/03/13).  The patient's wounds are healed.     Physical Examination Filed Vitals:   09/19/13 1227  BP: 106/60  Pulse: 62    R chest: Incision is healed  Medical Decision Making  Shawnell Dykes is a 53 y.o. year old female who presents s/p R mediport removal.  Thank you for allowing [place] to participate in this patient's care.  Adele Barthel, MD [facility] Office: [phone] Pager: [phone]  09/19/2013, 12:35 PM

## 2013-09-20 ENCOUNTER — Encounter: Payer: Medicare Other | Admitting: Vascular Surgery

## 2013-10-01 DIAGNOSIS — M79609 Pain in unspecified limb: Secondary | ICD-10-CM | POA: Diagnosis not present

## 2013-10-01 DIAGNOSIS — B351 Tinea unguium: Secondary | ICD-10-CM | POA: Diagnosis not present

## 2013-10-08 ENCOUNTER — Encounter: Payer: Self-pay | Admitting: Family Medicine

## 2013-10-08 DIAGNOSIS — I890 Lymphedema, not elsewhere classified: Secondary | ICD-10-CM | POA: Diagnosis not present

## 2013-10-08 DIAGNOSIS — M6281 Muscle weakness (generalized): Secondary | ICD-10-CM | POA: Diagnosis not present

## 2013-10-08 DIAGNOSIS — G35 Multiple sclerosis: Secondary | ICD-10-CM | POA: Diagnosis not present

## 2013-10-08 DIAGNOSIS — IMO0001 Reserved for inherently not codable concepts without codable children: Secondary | ICD-10-CM | POA: Diagnosis not present

## 2013-10-31 DIAGNOSIS — Z23 Encounter for immunization: Secondary | ICD-10-CM | POA: Diagnosis not present

## 2013-10-31 DIAGNOSIS — I890 Lymphedema, not elsewhere classified: Secondary | ICD-10-CM | POA: Diagnosis not present

## 2013-10-31 DIAGNOSIS — Z0000 Encounter for general adult medical examination without abnormal findings: Secondary | ICD-10-CM | POA: Diagnosis not present

## 2013-10-31 DIAGNOSIS — G35 Multiple sclerosis: Secondary | ICD-10-CM | POA: Diagnosis not present

## 2013-10-31 DIAGNOSIS — R5383 Other fatigue: Secondary | ICD-10-CM | POA: Diagnosis not present

## 2013-10-31 DIAGNOSIS — R5381 Other malaise: Secondary | ICD-10-CM | POA: Diagnosis not present

## 2013-10-31 DIAGNOSIS — Z1239 Encounter for other screening for malignant neoplasm of breast: Secondary | ICD-10-CM | POA: Diagnosis not present

## 2013-10-31 DIAGNOSIS — Z9181 History of falling: Secondary | ICD-10-CM | POA: Diagnosis not present

## 2013-10-31 DIAGNOSIS — Z1331 Encounter for screening for depression: Secondary | ICD-10-CM | POA: Diagnosis not present

## 2013-11-07 ENCOUNTER — Encounter: Payer: Self-pay | Admitting: Family Medicine

## 2013-11-07 DIAGNOSIS — I890 Lymphedema, not elsewhere classified: Secondary | ICD-10-CM | POA: Diagnosis not present

## 2013-11-07 DIAGNOSIS — G35 Multiple sclerosis: Secondary | ICD-10-CM | POA: Diagnosis not present

## 2013-11-07 DIAGNOSIS — R531 Weakness: Secondary | ICD-10-CM | POA: Diagnosis not present

## 2013-11-07 DIAGNOSIS — Z5189 Encounter for other specified aftercare: Secondary | ICD-10-CM | POA: Diagnosis not present

## 2013-11-13 ENCOUNTER — Encounter (INDEPENDENT_AMBULATORY_CARE_PROVIDER_SITE_OTHER): Payer: Self-pay

## 2013-11-13 ENCOUNTER — Ambulatory Visit (INDEPENDENT_AMBULATORY_CARE_PROVIDER_SITE_OTHER): Payer: Medicare Other | Admitting: Diagnostic Neuroimaging

## 2013-11-13 ENCOUNTER — Encounter: Payer: Self-pay | Admitting: Diagnostic Neuroimaging

## 2013-11-13 VITALS — BP 114/65 | HR 58

## 2013-11-13 DIAGNOSIS — R609 Edema, unspecified: Secondary | ICD-10-CM | POA: Diagnosis not present

## 2013-11-13 DIAGNOSIS — G35 Multiple sclerosis: Secondary | ICD-10-CM | POA: Diagnosis not present

## 2013-11-13 NOTE — Progress Notes (Signed)
[facility]  PATIENT: Rebecca [name] DOB: 09-07-1960  REFERRING CLINICIAN:  HISTORY FROM: patient, husband, mother, aunt REASON FOR VISIT: follow up   HISTORICAL  CHIEF COMPLAINT:  Chief Complaint  Patient presents with  . Follow-up    MS    HISTORY OF PRESENT ILLNESS:   UPDATE 11/13/13: Since last visit, doing well. Getting wrap / lymphedema therapy via Amy (OT) at [facility] with great results. Significant improvement. Tolerating rebif. No new lesions or symptoms.  UPDATE 08/14/13: Since last visit, has continued on rebif. BLE edema still a problem. Saw cardiology, tried diuretic, and also trying wrap/massage therapy next week. Walking and mobility gradually worsening.   UPDATE 02/13/13 (LL): The patient returns for followup visit. Since last visit she states that she has seen Dr. Katy Fitch and that she needs new prescription eyeglasses for vision problems that the double vision resolved. She is tolerating rebif. Her biggest concern is that her bilateral leg edema is making it difficult for her to move her legs or ambulate. She is not consistently wearing her TED hose. She denies any shortness of breath, but is mostly wheelchair-bound. She states that she feels like she is progressively getting weaker. She asked for information about Tecfidera. She said she had a cardiology consultation in the past when she started Gilenya, but that Dr. has since retired. She states she was diagnosed with aortic insufficiency. Her urgency and frequency of urination is getting worse, she asked if she can be increased on her medication.   UPDATE 11/07/12: Since last visit, doing well, until 1 month ago and developed fluctuating double vision/blurred vision. Affects right greater than left eye. It is present even if she closes the right or left eyes.   UPDATE 04/20/12: Since last visit, has come off gabapentin. BLE swelling stable. S/p ACDF surg per Dr. Vertell Limber and doing well. Tolerating rebif. Has new  PCP and also getting PT. Few days ago, developed some numbness in right hand, similar to pre-op symptoms which improved post-op. No neck pain.   UPDATE 01/10/12: Noticed worsening swelling to bilateral lower extremities since 01/04/12. She is also having throbbing pain to shins. Difficulty with wearing sneakers. Unable to use leg compression machine. Denies shortness of breath.   UPDATE 10/27/11: Having surgery on c-spine fusion on November 17, 2011. Concerned about recovery time and her ability to perform ADL's. Lives with aunt. Wanting to know if she needs to have someone come to home or stay at rehab. Intermittent blurred vision las 3 weeks.   UPDATE 07/27/11: Doing better with shingles and post-herpetic neuralgia pain; on gralise. Now with worsening RLE weakness, fatigue, vision changes, speech diff. Discussed tx options.   UPDATE 06/02/11: On 05/27/11 developed right lower tooth pain; 4/20 developed blisters and rash on right chin and jaw. 4/22 went to dentist, diagnosed with tooth infx and given amoxicillin. Rash more significant. 06/01/11, went to dermatologist and dx'd possible shingles. Given valtrex and started on 4/24. Lesions still oozing. Has right ear and right facial pain, severe. No eye pain.   UPDATE 03/02/10: Patient was getting worked up for first dose observation for Gilenya, however on 02/28/2011, patient had a suspected seizure. She was in the bathroom at home, stood up and without warning have loss of consciousness. Her mother found her on the floor groaning with some twitching movements of her upper extremities, and her legs outstretched in front of her. No incontinence or tongue biting. It took approximately 20-30 minutes for her to wake up. She was  amnestic events until the next day.  In retrospect, patient had 4 or 5 episodes of syncope versus seizure in the early 1980s. She was never on antiseizure medication. Patient was also on Ampyra since past 2 years.   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 Last dose tysabri on 10/29/10. She would like to switch to [place]. No MS flares. Some increased fatigue. Also asking if amantadine can be stopped to see if peripheral edema improves.   PRIOR HPI (11/02/10): 53 year old right-handed female with history of multiple sclerosis. Here for transfer of care from [place]. 2001 - patient developed left leg numbness and weakness, followed by right facial weakness and slurred speech. She was evaluated with MRI of the brain, diagnosed with multiple sclerosis and started on Copaxone. She did not have lumbar puncture or spinal cord MRI. 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 She thinks she was off of Copaxone for approximately 2 years. 2008-9 - patient was evaluated at [facility] and started on Tysabri. She thinks she has been on Tysabri for approximately 3-1/2 years. Last dose was last week at [facility]. She continues to decline in terms of mobility. She is to walk using a cane, then started using a walker, now uses a transport chair occasionally. Patient continues to have intermittent right facial twitching and numbness. She is on amantadine, baclofen, Amprya, and oxybutynin for MS symptoms.    REVIEW OF SYSTEMS: Full 14 system review of systems performed and notable only for leg swelling murmur restless legs aching muscles gait diff.   ALLERGIES: Allergies  Allergen Reactions  . Sulfa Antibiotics Hives and Other (See Comments)    Light headed, over heated    HOME MEDICATIONS: Outpatient Prescriptions Prior to Visit  Medication Sig Dispense Refill  . amantadine (SYMMETREL) 100 MG capsule Take 100 mg by mouth 3 (three) times daily.      . baclofen (LIORESAL) 10 MG tablet Take 10-20 mg by mouth 3 (three) times daily. 20mg  in the morning, 10mg  at noon and 20mg  at bedtime.      . Cholecalciferol (VITAMIN D3) 2000 UNITS capsule Take 2,000 Units by mouth daily.      Marland Kitchen  GLUCOSAMINE-CHONDROITIN PO Take 1 tablet by mouth 3 (three) times daily. STRENGTH: 1500-1200      . HYDROcodone-acetaminophen (NORCO/VICODIN) 5-325 MG per tablet Take 1 tablet by mouth every 12 (twelve) hours as needed for moderate pain.  30 tablet  0  . interferon beta-1a (REBIF REBIDOSE) 44 MCG/0.5ML injection Inject  44 mcg Sub-q 3 times weekly Rotate site after each injection  6 mL  12  . levETIRAcetam (KEPPRA) 500 MG tablet Take 500 mg by mouth 2 (two) times daily.      . Multiple Vitamins-Minerals (MULTIVITAMIN PO) Take 1 tablet by mouth daily.      . naproxen (NAPROSYN) 250 MG tablet Take 500 mg by mouth as needed for mild pain.       Marland Kitchen oxybutynin (DITROPAN XL) 10 MG 24 hr tablet Take 1 tablet (10 mg total) by mouth at bedtime.  90 tablet  3  . acetaminophen-codeine (TYLENOL #3) 300-30 MG per tablet Take 1-2 tablets by mouth every 4 (four) hours as needed for moderate pain.  15 tablet  0   No facility-administered medications prior to visit.    PAST MEDICAL HISTORY: Past Medical History  Diagnosis Date  . Herpes zoster   . Multiple sclerosis     Walks from  room to room @ home; but Wheelchair when going out.  . Closed head injury with brief loss of consciousness   . Seizures     Takes Keppra  . IBS (irritable bowel syndrome)     no longer  . Bacterial endocarditis     History of .  Marland Kitchen Aortic valve disease     Mild AS / AI (cannot exclude Bicuspid AoV)  . Syncope and collapse   . Bilateral lower extremity edema     Chronic. LE Venous dopplers - negative for DVT.  Marland Kitchen Neuromuscular disorder     MS  . Heart murmur   . Lymphedema     [facility] legs [facility]    PAST SURGICAL HISTORY: Past Surgical History  Procedure Laterality Date  . Cholecystectomy    . Ankle surgery      Left  . Port-a-cath removal      right  . Anterior cervical decomp/discectomy fusion  11/17/2011    Procedure: ANTERIOR CERVICAL DECOMPRESSION/DISCECTOMY FUSION 2 LEVELS;  Surgeon: Erline Levine, MD;   Location: [facility];  Service: Neurosurgery;  Laterality: N/A;  Cervical Five-Six Six-Seven Anterior cervical decompression/diskectomy/fusion  . Transthoracic echocardiogram  March 15, 2013    Normal LV size and function with EF 60-65%.; Cannot exclude bicuspid aortic valve with mild AS and mild AI.  Marland Kitchen Lower extremity venous dopplers  Feb 27, 2013    No LE DVT  . Port a cath insertion Right 01/19/2010  . Colonoscopy  2014  . Upper gi endoscopy  2014  . Port-a-cath removal Right 09/03/2013    Procedure: REMOVAL PORT-A-CATH;  Surgeon: Conrad Hawthorne, MD;  Location: [facility] OR;  Service: Vascular;  Laterality: Right;    FAMILY HISTORY: Family History  Problem Relation Age of Onset  . Breast cancer Father   . Heart disease Father   . Heart attack Father   . Breast cancer Maternal Aunt   . Breast cancer Maternal Grandmother     SOCIAL HISTORY:  History   Social History  . Marital Status: Married    Spouse Name: don    Number of Children: 0  . Years of Education: 12   Occupational History  . disability    Social History Main Topics  . Smoking status: Never Smoker   . Smokeless tobacco: Never Used  . Alcohol Use: No  . Drug Use: No  . Sexual Activity: Yes    Partners: Male   Other Topics Concern  . Not on file   Social History Narrative   She is married. Recently moved back to [place] after being in [place] for some time. She is accompanied by her husband and aunt.   Never smoked. Never used alcohol.     PHYSICAL EXAM  Filed Vitals:   11/13/13 1501  BP: 114/65  Pulse: 58    Not recorded    Cannot calculate BMI with a height equal to zero.  GENERAL EXAM:  General: Patient is awake, alert and in no acute distress. Well developed and groomed.  Neck: Neck is supple.  Cardiovascular: Heart is regular rate and rhythm with mild systolic murmur. IMPROVED PERIPHERAL EDEMA IN BLE.   Neurologic Exam  Mental Status: Awake, alert. Language is fluent and  comprehension intact. GOOD MOOD. Cranial Nerves: Pupils are equal and reactive to light. Visual fields are full to confrontation. Conjugate eye movements are full and symmetric. Facial sensation and strength are symmetric. Hearing is intact. Palate elevated symmetrically and uvula is  midline. Shoulder shrug is symmetric. Tongue is midline.  Motor: Normal bulk and tone. Full strength in the upper extremities; BLE (3/5 prox, 4/5 distal), INCREASED TONE IN BLE. No pronator drift.  Sensory: Intact and symmetric to light touch.  Coordination: FTN SMOOTH Reflexes: Deep tendon reflexes in the upper and lower extremity are present and symmetric; TRACE AT KNEES AND ANKLES. PERIPHERAL EDEMA. Gait and Station: [place].     DIAGNOSTIC DATA (LABS, IMAGING, TESTING) - I reviewed patient records, labs, notes, testing and imaging myself where available.  Lab Results  Component Value Date   WBC 2.8* 11/11/2011   HGB 13.6 09/03/2013   HCT 40.0 09/03/2013   MCV 88.9 11/11/2011   PLT 104* 11/11/2011      Component Value Date/Time   NA 140 09/03/2013 0803   NA 141 05/15/2013 1148   K 3.7 09/03/2013 0803   CL 102 05/15/2013 1148   CO2 29 05/15/2013 1148   GLUCOSE 85 09/03/2013 0803   GLUCOSE 79 05/15/2013 1148   BUN 19 05/15/2013 1148   BUN 17 11/11/2011 1019   CREATININE 0.51* 05/15/2013 1148   CALCIUM 9.2 05/15/2013 1148   GFRNONAA 110 05/15/2013 1148   GFRAA 127 05/15/2013 1148   No results found for this basename: CHOL,  HDL,  LDLCALC,  LDLDIRECT,  TRIG,  CHOLHDL   No results found for this basename: HGBA1C   No results found for this basename: VITAMINB12   No results found for this basename: TSH    I reviewed images myself and agree with interpretation. -VRP  08/21/13 MRI brain (with and without) demonstrating:  1. Multiple periventricular and subcortical chronic demyelinating plaques. Some of these are confluent. Some are hypointense on T1.  2. No acute plaques.  3. No significant change from MRI on  11/20/12.   08/21/13 MRI cervical spine (with and without) demonstrating:  1. At C2-3: disc bulging and facet hypertrophy with mild spinal stenosis and mild left foraminal stenosis.  2. At C5-6: uncovertebral joint hypertrophy with mild right foraminal stenosis.  3. Multiple chronic demyelinating plaques from C2 to T1. No abnormal enhancing lesions.  4. Compared to MRI from 10/04/11, there has been progression of degenerative spine disease at C2-3. Also there has been interval ACDF from C5-C7. No significant change in spinal cord lesions.  08/21/13 MRI thoracic spine (with and without) demonstrating:  1. Multiple spinal cord chronic demyelinating plaques from C7 to T12-L1.  2. No acute plaques.  3. ACDF at C5-C7.  4. Compared to prior MRI from 08/03/11, the ACDF is new. Overall no new demyelinating plaques.    ASSESSMENT AND PLAN  53 y.o. year old female here with multiple sclerosis, initially on copaxone, then was on Tysabri in the past (3-1/2 years); then became JCV +. Last dose tysabri 10/29/10. Switched to [place] on 04/25/11. Then with right V2, V3 herpes zoster on 05/27/11. Gilenya stopped. Shingles resolved. Now on rebif. Also s/p ACDF C5-6 (Oct 2013) for spinal stenosis. Doing well with wrap/massage/lymphedema therapy for legs.  PLAN:  1. continue rebif  2. PT evaluation  Return in about 3 months (around 02/13/2014).    Penni Bombard, MD 30/02/6008, 9:32 PM Certified in Neurology, Neurophysiology and Neuroimaging  [facility] [address], [place], [place] [address] [phone]

## 2013-11-13 NOTE — Patient Instructions (Signed)
Continue rebif, baclofen and amantadine.

## 2013-11-14 DIAGNOSIS — Z1159 Encounter for screening for other viral diseases: Secondary | ICD-10-CM | POA: Diagnosis not present

## 2013-11-14 DIAGNOSIS — R5383 Other fatigue: Secondary | ICD-10-CM | POA: Diagnosis not present

## 2013-11-14 DIAGNOSIS — Z13220 Encounter for screening for lipoid disorders: Secondary | ICD-10-CM | POA: Diagnosis not present

## 2013-11-20 ENCOUNTER — Telehealth: Payer: Self-pay | Admitting: Diagnostic Neuroimaging

## 2013-11-20 NOTE — Telephone Encounter (Signed)
I called patient with lab results from PCP. WBC 3.2 and plts 104. Slightly low, and could be related to rebif side effect. Recommended to repeat in 2 months, and if still low, then will move forward with transition to another medication (tecfidera, aubagio or lemtrada). Patient will get blood testing (CBC, CMP) with PCP.  Penni Bombard, MD 54/62/7035, 0:09 AM Certified in Neurology, Neurophysiology and Neuroimaging  [facility] [address], [place], [place] [address] [phone]

## 2013-12-11 ENCOUNTER — Ambulatory Visit: Payer: Self-pay | Admitting: Family Medicine

## 2013-12-11 DIAGNOSIS — R928 Other abnormal and inconclusive findings on diagnostic imaging of breast: Secondary | ICD-10-CM | POA: Diagnosis not present

## 2013-12-11 DIAGNOSIS — Z1231 Encounter for screening mammogram for malignant neoplasm of breast: Secondary | ICD-10-CM | POA: Diagnosis not present

## 2013-12-12 ENCOUNTER — Encounter: Payer: Self-pay | Admitting: Family Medicine

## 2013-12-17 ENCOUNTER — Other Ambulatory Visit: Payer: Self-pay

## 2013-12-17 MED ORDER — LEVETIRACETAM 500 MG PO TABS: 500.0000 mg | ORAL_TABLET | Freq: Two times a day (BID) | ORAL | Status: DC

## 2013-12-24 ENCOUNTER — Ambulatory Visit: Payer: Self-pay | Admitting: Family Medicine

## 2013-12-24 DIAGNOSIS — N63 Unspecified lump in breast: Secondary | ICD-10-CM | POA: Diagnosis not present

## 2013-12-24 DIAGNOSIS — R920 Mammographic microcalcification found on diagnostic imaging of breast: Secondary | ICD-10-CM | POA: Diagnosis not present

## 2013-12-31 ENCOUNTER — Ambulatory Visit: Payer: Self-pay | Admitting: Family Medicine

## 2013-12-31 DIAGNOSIS — C50812 Malignant neoplasm of overlapping sites of left female breast: Secondary | ICD-10-CM | POA: Diagnosis not present

## 2013-12-31 DIAGNOSIS — N63 Unspecified lump in breast: Secondary | ICD-10-CM | POA: Diagnosis not present

## 2013-12-31 DIAGNOSIS — C50811 Malignant neoplasm of overlapping sites of right female breast: Secondary | ICD-10-CM | POA: Diagnosis not present

## 2013-12-31 DIAGNOSIS — C50919 Malignant neoplasm of unspecified site of unspecified female breast: Secondary | ICD-10-CM

## 2013-12-31 HISTORY — DX: Malignant neoplasm of unspecified site of unspecified female breast: C50.919

## 2013-12-31 HISTORY — PX: BREAST BIOPSY: SHX20

## 2014-01-09 ENCOUNTER — Encounter: Payer: Self-pay | Admitting: General Surgery

## 2014-01-09 ENCOUNTER — Telehealth: Payer: Self-pay | Admitting: Diagnostic Neuroimaging

## 2014-01-09 ENCOUNTER — Ambulatory Visit (INDEPENDENT_AMBULATORY_CARE_PROVIDER_SITE_OTHER): Payer: Medicare Other | Admitting: General Surgery

## 2014-01-09 VITALS — BP 116/82 | HR 70 | Resp 12 | Ht 64.0 in | Wt 174.0 lb

## 2014-01-09 DIAGNOSIS — C50911 Malignant neoplasm of unspecified site of right female breast: Secondary | ICD-10-CM

## 2014-01-09 DIAGNOSIS — C50919 Malignant neoplasm of unspecified site of unspecified female breast: Secondary | ICD-10-CM | POA: Diagnosis not present

## 2014-01-09 NOTE — Patient Instructions (Addendum)
Continue self breast exams. Call office for any new breast issues or concerns.  This patient will think about her options and notify the office how she would like to proceed.   Patient to have the following labs drawn at [facility] lab today: CBC, Met C, CEA, and CA 27.29.

## 2014-01-09 NOTE — Telephone Encounter (Signed)
Patient stated she was recently diagnosed with Breast Cancer and would like to discuss options with Dr. Leta Baptist.  Please call and advise.

## 2014-01-09 NOTE — Progress Notes (Signed)
Patient ID: Rebecca [name], female   DOB: [birth year], 53 y.o.   MRN: [id number]  Chief Complaint  Patient presents with  . Breast Problem    right breast cancer    HPI MEARL Rebecca [name] is a 53 y.o. female.  who presents for a breast evaluation. The most recent mammogram was done on 12-11-13 with a right breast biopsy done 12-31-13.  Patient does perform regular self breast checks and gets regular mammograms done. She states she did not feel anything different in the breast prior to the biopsy.  She is here today with her husband and aunt Randall Rebecca [name], who is herself a breast cancer survivor). The patient has MS and make use of a wheelchair extensive ambulation is required. She is otherwise able to transfer with minimal assistance.    HPI  Past Medical History  Diagnosis Date  . Herpes zoster   . Multiple sclerosis 2001    Walks from room to room @ home; but Wheelchair when going out.  . Closed head injury with brief loss of consciousness   . Seizures     Takes Keppra  . IBS (irritable bowel syndrome)     no longer  . Bacterial endocarditis     History of .  Marland Kitchen Aortic valve disease     Mild AS / AI (cannot exclude Bicuspid AoV)  . Syncope and collapse   . Bilateral lower extremity edema     Chronic. LE Venous dopplers - negative for DVT.  Marland Kitchen Neuromuscular disorder     MS  . Heart murmur   . Lymphedema     has legs wrapped at [facility]  . Cancer 12-31-13    right breast/invasive mammary   . Cervical stenosis of spine     Past Surgical History  Procedure Laterality Date  . Cholecystectomy    . Ankle surgery      Left  . Port-a-cath removal      right  . Anterior cervical decomp/discectomy fusion  11/17/2011    Procedure: ANTERIOR CERVICAL DECOMPRESSION/DISCECTOMY FUSION 2 LEVELS;  Surgeon: Erline Levine, MD;  Location: [place] [facility];  Service: Neurosurgery;  Laterality: N/A;  Cervical Five-Six Six-Seven Anterior cervical decompression/diskectomy/fusion  . Transthoracic  echocardiogram  March 15, 2013    Normal LV size and function with EF 60-65%.; Cannot exclude bicuspid aortic valve with mild AS and mild AI.  Marland Kitchen Lower extremity venous dopplers  Feb 27, 2013    No LE DVT  . Port a cath insertion Right 01/19/2010  . Colonoscopy  2014  . Upper gi endoscopy  2014  . Port-a-cath removal Right 09/03/2013    Procedure: REMOVAL PORT-A-CATH;  Surgeon: Conrad Humboldt, MD;  Location: [place];  Service: Vascular;  Laterality: Right;  . Breast biopsy Right 12-31-13    invasive mammary    Family History  Problem Relation Age of Onset  . Cancer Father     skin  . Heart disease Father   . Heart attack Father   . Breast cancer Maternal Aunt 60  . Breast cancer Maternal Grandmother 74    Social History History  Substance Use Topics  . Smoking status: Never Smoker   . Smokeless tobacco: Never Used  . Alcohol Use: No    Allergies  Allergen Reactions  . Sulfa Antibiotics Hives and Other (See Comments)    Light headed, over heated    Current Outpatient Prescriptions  Medication Sig Dispense Refill  . amantadine (SYMMETREL) 100 MG capsule Take  100 mg by mouth 3 (three) times daily.    . baclofen (LIORESAL) 10 MG tablet Take 10-20 mg by mouth 3 (three) times daily. $RemoveBefo'20mg'DSOonPfdaqx$  in the morning, $RemoveBefo'10mg'KudRLTGTLVI$  at noon and $Remove'20mg'HGgFgnG$  at bedtime.    . Cholecalciferol (VITAMIN D3) 2000 UNITS capsule Take 2,000 Units by mouth daily.    . Cranberry 1000 MG CAPS Take by mouth daily.    Marland Kitchen GLUCOSAMINE-CHONDROITIN PO Take 1 tablet by mouth 3 (three) times daily. STRENGTH: 1500-1200    . HYDROcodone-acetaminophen (NORCO/VICODIN) 5-325 MG per tablet Take 1 tablet by mouth every 12 (twelve) hours as needed for moderate pain. 30 tablet 0  . interferon beta-1a (REBIF REBIDOSE) 44 MCG/0.5ML injection Inject  44 mcg Sub-q 3 times weekly Rotate site after each injection 6 mL 12  . levETIRAcetam (KEPPRA) 500 MG tablet Take 1 tablet (500 mg total) by mouth 2 (two) times daily. 60 tablet 12  . Misc Natural  Products (LEG VEIN & CIRCULATION) TABS Take by mouth daily.    . Multiple Vitamins-Minerals (MULTIVITAMIN PO) Take 1 tablet by mouth daily.    . naproxen (NAPROSYN) 250 MG tablet Take 500 mg by mouth as needed for mild pain.     Marland Kitchen oxybutynin (DITROPAN XL) 10 MG 24 hr tablet Take 1 tablet (10 mg total) by mouth at bedtime. 90 tablet 3   No current facility-administered medications for this visit.    Review of Systems Review of Systems  Constitutional: Negative.   Respiratory: Negative.   Cardiovascular: Negative.     Blood pressure 116/82, pulse 70, resp. rate 12, height $RemoveBe'5\' 4"'HbdJRLUpZ$  (1.626 m), weight 174 lb (78.926 kg).  Physical Exam Physical Exam  Constitutional: She is oriented to person, place, and time. She appears well-developed and well-nourished.  Neck: Neck supple.  Cardiovascular: Normal rate, regular rhythm and normal heart sounds.   Pulmonary/Chest: Effort normal and breath sounds normal.    Left nipple dry and crusted.   6 cm bruise right breast and 1.5 cm mass at 11 o'clock right breast.   Old scar right chest from previous port. (The port was used for infusion related to her multiple sclerosis.)  Lymphadenopathy:    She has no cervical adenopathy.  Neurological: She is alert and oriented to person, place, and time.  Skin: Skin is warm and dry.    Data Reviewed Diagnostic mammogram of the right breast dated 12/24/2013 showed a persistent mass.  Ultrasound showed a 1.1 x 1.4 x 1.5 cm mass. BI-RADS-5. Screening mammograms of the left breast were unremarkable. Ultrasound of the axilla was reported as unremarkable.  Right breast biopsy was completed 12/31/2013 by the radiology service.  She showed invasive mammary carcinoma. Largest diameter 0.6 cm. ER negative, PR negative, HER-2/neu negative.  In formal consultation by phone with medical oncology reports no neoadjuvant protocols for triple negative disease less than 2 cm in diameter.  Assessment    Stage I  carcinoma the right breast.    Plan    Breast conservation and mastectomy were presented as equivalent procedures. Availability of second opinion, medical oncology and radiation oncology consultation were reviewed.  Her breast following is probably adequate to support wide excision, but her aunt did raise the question if mastectomy would necessary with this be completed at the original procedure. It was explained that this would be up to the patient of granting permission to use best judgment regarding management of the primary tumor.  In light of her medical history, while mastectomy with immediate reconstruction could be  considered, I would not encourage this. With the finding of a triple negative tumor there is a reasonable likelihood that adjuvant therapy will be recommended an immediate reconstruction could significantly delay this.    Discussed surgical options. This patient will think about her options and notify the office how she would like to proceed.   Patient to have the following labs drawn at [facility] lab today: CBC, Met C, CEA, and CA 27.29.    PCP/Ref:  Matt Holmes 01/10/2014, 8:28 PM

## 2014-01-09 NOTE — Telephone Encounter (Signed)
I called patient. Recently diagnosed with breast cancer (stage 1) a few days ago. Facing options of radical mastectomy vs focal resection and radiation. Advised to discuss with surgeon and oncologist to see which option would have best chance for definitive cure / remission and least chance for need of adjuvant chemo / radiation. I am available to discuss further if needed.   Penni Bombard, MD 29/06/1882, 1:66 PM Certified in Neurology, Neurophysiology and Neuroimaging  [facility] [address] [place], [place] [address] [phone]

## 2014-01-10 ENCOUNTER — Telehealth: Payer: Self-pay

## 2014-01-10 DIAGNOSIS — C50911 Malignant neoplasm of unspecified site of right female breast: Secondary | ICD-10-CM | POA: Insufficient documentation

## 2014-01-10 LAB — COMPREHENSIVE METABOLIC PANEL
ALT: 17 IU/L (ref 0–32)
AST: 23 IU/L (ref 0–40)
Albumin/Globulin Ratio: 1.7 (ref 1.1–2.5)
Albumin: 4.3 g/dL (ref 3.5–5.5)
Alkaline Phosphatase: 87 IU/L (ref 39–117)
BILIRUBIN TOTAL: 0.3 mg/dL (ref 0.0–1.2)
BUN / CREAT RATIO: 37 — AB (ref 9–23)
BUN: 20 mg/dL (ref 6–24)
CALCIUM: 9.7 mg/dL (ref 8.7–10.2)
CO2: 29 mmol/L (ref 18–29)
CREATININE: 0.54 mg/dL — AB (ref 0.57–1.00)
Chloride: 100 mmol/L (ref 97–108)
GFR, EST AFRICAN AMERICAN: 125 mL/min/{1.73_m2} (ref >59)
GFR, EST NON AFRICAN AMERICAN: 108 mL/min/{1.73_m2} (ref >59)
GLOBULIN, TOTAL: 2.6 g/dL (ref 1.5–4.5)
Glucose: 86 mg/dL (ref 65–99)
Potassium: 4.5 mmol/L (ref 3.5–5.2)
SODIUM: 141 mmol/L (ref 134–144)
Total Protein: 6.9 g/dL (ref 6.0–8.5)

## 2014-01-10 LAB — CBC WITH DIFFERENTIAL
BASOS: 0 %
Basophils Absolute: 0 10*3/uL (ref 0.0–0.2)
Eos: 4 %
Eosinophils Absolute: 0.1 10*3/uL (ref 0.0–0.4)
HCT: 34.2 % (ref 34.0–46.6)
HEMOGLOBIN: 11.5 g/dL (ref 11.1–15.9)
IMMATURE GRANS (ABS): 0 10*3/uL (ref 0.0–0.1)
Immature Granulocytes: 0 %
LYMPHS ABS: 0.5 10*3/uL — AB (ref 0.7–3.1)
Lymphs: 14 %
MCH: 30.4 pg (ref 26.6–33.0)
MCHC: 33.6 g/dL (ref 31.5–35.7)
MCV: 91 fL (ref 79–97)
MONOS ABS: 0.5 10*3/uL (ref 0.1–0.9)
Monocytes: 13 %
Neutrophils Absolute: 2.3 10*3/uL (ref 1.4–7.0)
Neutrophils Relative %: 69 %
Platelets: 118 10*3/uL — ABNORMAL LOW (ref 150–379)
RBC: 3.78 x10E6/uL (ref 3.77–5.28)
RDW: 13.1 % (ref 12.3–15.4)
WBC: 3.4 10*3/uL (ref 3.4–10.8)

## 2014-01-10 LAB — CANCER ANTIGEN 27.29: CA 27.29: 26.2 U/mL (ref 0.0–38.6)

## 2014-01-10 LAB — CEA: CEA: 1.3 ng/mL (ref 0.0–4.7)

## 2014-01-10 NOTE — Telephone Encounter (Signed)
Notified patient as instructed, patient pleased. Discussed follow-up appointments, patient agrees. Patient requests that her lab results be forwarded to her PCP. Lab results faxed to Dr Nadine Counts per patient request.

## 2014-01-10 NOTE — Telephone Encounter (Signed)
-----   Message from [name], MD sent at 01/10/2014 10:44 AM EST ----- Please let the patient know that all of her laboratory studies were fine. [place] ----- Message -----    From: [facility] Lab Results In Interface    Sent: 01/10/2014   5:40 AM      To: [name], MD

## 2014-01-14 ENCOUNTER — Telehealth: Payer: Self-pay | Admitting: General Surgery

## 2014-01-14 ENCOUNTER — Ambulatory Visit: Payer: Self-pay | Admitting: General Surgery

## 2014-01-14 NOTE — Telephone Encounter (Signed)
PATIENT  CALLED TO LET YOU KNOW SHE HAS DECIDED TO HAVE A RT MASTECTOMY. SHE HAS LYMPHEDEMA IN HER LOWER TORSO WITH THE PROCEDURE WOULD IT CAUSE HER SWELLING IN HER RT ARM?

## 2014-01-14 NOTE — Telephone Encounter (Signed)
The patient called reporting that she had decided to have a mastectomy. When I contacted her she reports that her neurologist had told her that she could expect significant fatigue with radiation therapy. I explained to her that while fatigue is common during treatment, it is usually short-lived. With this new information she decided that she would like to have breast conservation if possible. She is a "B" cup breast, and if there are satellite lesions as suggested on ultrasound she may not have an acceptable cosmetic result. In light of this she is amenable to mastectomy if this is necessary to achieve clear margins and achieve a late good cosmetic result. The role of sentinel node biopsy was discussed.  The patient is making use of interferon for her multiple sclerosis. Review of the up-to-date literature shows wound healing issues occur less than 1% of people. As she is severely debilitated when she is off interferon, we'll plan to maintain this medication during her surgical experience.  Her platelet count has been slightly low over the past 2 years, likely related to interferon, but has been above 100,000 within the last month which should be adequate for surgery.

## 2014-01-15 ENCOUNTER — Telehealth: Payer: Self-pay | Admitting: *Deleted

## 2014-01-15 NOTE — Telephone Encounter (Signed)
She was just calling back to see if Dr. Bary Castilla had found out if the Interferon(Rebif) that she takes would interfere with her surgery or radiation treatments?

## 2014-01-15 NOTE — Telephone Encounter (Signed)
Pt is calling wanted to talk to you regarding some medication that her and Dr.Byrnett talked about at her office visit.

## 2014-01-15 NOTE — Telephone Encounter (Signed)
Notified patient as instructed, patient pleased °

## 2014-01-15 NOTE — Telephone Encounter (Signed)
Considering the severe symptoms the patient's experienced in the past with didn't discontinuation of interferon, and the low chance of impaired healing if surgery is completed while on medication, we'll not have her discontinue her medications prior to surgery.

## 2014-01-15 NOTE — Telephone Encounter (Signed)
Patient's surgery has been scheduled for 02-03-14 at [facility]. (This patient reports LMP was in 2008.)  Paperwork has been mailed to the patient and she verbalizes inderstanding.   Further instructions will be reviewed at pre-op appointment on 01-27-14.

## 2014-01-22 NOTE — Telephone Encounter (Signed)
Patient stated Surgeon decided to remove as much cancerous cell as possible, if it appears to be more then they assume, they will at that time remove breast.  Surgery is scheduled 12/28 and plan to have radiation.  Scheduled to see Dr. Bary Castilla on 12/21.

## 2014-01-27 ENCOUNTER — Ambulatory Visit: Payer: Self-pay | Admitting: General Surgery

## 2014-01-27 ENCOUNTER — Ambulatory Visit (INDEPENDENT_AMBULATORY_CARE_PROVIDER_SITE_OTHER): Payer: Medicare Other | Admitting: General Surgery

## 2014-01-27 ENCOUNTER — Other Ambulatory Visit: Payer: Self-pay | Admitting: General Surgery

## 2014-01-27 ENCOUNTER — Ambulatory Visit: Payer: Self-pay | Admitting: Radiation Oncology

## 2014-01-27 VITALS — BP 122/80 | HR 72 | Resp 14 | Ht 64.0 in | Wt 181.0 lb

## 2014-01-27 DIAGNOSIS — C50911 Malignant neoplasm of unspecified site of right female breast: Secondary | ICD-10-CM

## 2014-01-27 NOTE — Progress Notes (Signed)
Patient ID: Rebecca [name], female   DOB: [birth year], 53 y.o.   MRN: [id number]  Chief Complaint  Patient presents with  . Routine Post Op    right breast wide excision    HPI Rebecca [name] is a 53 y.o. female here today for her pre op right breast wide excision scheduled on 02/03/14.   The patient had been contacted by phone last week regarding treatment options. She was accompanied today by her aunt, younger sister and husband. She reports that she has episodic, but frequent weakness and difficulty with her lower extremity lymphedema that makes transportation difficult. She required a therapy in the last year or 2 which would've been done 3 times per week basis. She usually made 2 of these 3 appointments. This may complicate a consideration for breast conservation and post surgical radiation. HPI  Past Medical History  Diagnosis Date  . Herpes zoster   . Multiple sclerosis 2001    Walks from room to room @ home; but Wheelchair when going out.  . Closed head injury with brief loss of consciousness   . Seizures     Takes Keppra  . IBS (irritable bowel syndrome)     no longer  . Bacterial endocarditis     History of .  Marland Kitchen Aortic valve disease     Mild AS / AI (cannot exclude Bicuspid AoV)  . Syncope and collapse   . Bilateral lower extremity edema     Chronic. LE Venous dopplers - negative for DVT.  Marland Kitchen Neuromuscular disorder     MS  . Heart murmur   . Lymphedema     has legs [facility] at [facility]  . Cancer 12-31-13    right breast/invasive mammary   . Cervical stenosis of spine     Past Surgical History  Procedure Laterality Date  . Cholecystectomy    . Ankle surgery      Left  . Port-a-cath removal      right  . Anterior cervical decomp/discectomy fusion  11/17/2011    Procedure: ANTERIOR CERVICAL DECOMPRESSION/DISCECTOMY FUSION 2 LEVELS;  Surgeon: Erline Levine, MD;  Location: [place] [facility];  Service: Neurosurgery;  Laterality: N/A;  Cervical Five-Six Six-Seven Anterior cervical  decompression/diskectomy/fusion  . Transthoracic echocardiogram  March 15, 2013    Normal LV size and function with EF 60-65%.; Cannot exclude bicuspid aortic valve with mild AS and mild AI.  Marland Kitchen Lower extremity venous dopplers  Feb 27, 2013    No LE DVT  . Port a cath insertion Right 01/19/2010  . Colonoscopy  2014  . Upper gi endoscopy  2014  . Port-a-cath removal Right 09/03/2013    Procedure: REMOVAL PORT-A-CATH;  Surgeon: Conrad [place], MD;  Location: [place];  Service: Vascular;  Laterality: Right;  . Breast biopsy Right 12-31-13    invasive mammary    Family History  Problem Relation Age of Onset  . Cancer Father     skin  . Heart disease Father   . Heart attack Father   . Breast cancer Maternal Aunt 60  . Breast cancer Maternal Grandmother 74    Social History History  Substance Use Topics  . Smoking status: Never Smoker   . Smokeless tobacco: Never Used  . Alcohol Use: No    Allergies  Allergen Reactions  . Sulfa Antibiotics Hives and Other (See Comments)    Light headed, over heated    Current Outpatient Prescriptions  Medication Sig Dispense Refill  . amantadine (SYMMETREL) 100  MG capsule Take 100 mg by mouth 3 (three) times daily.    . baclofen (LIORESAL) 10 MG tablet Take 10-20 mg by mouth 3 (three) times daily. 20mg  in the morning, 10mg  at noon and 20mg  at bedtime.    . Cholecalciferol (VITAMIN D3) 2000 UNITS capsule Take 2,000 Units by mouth daily.    . Cranberry 1000 MG CAPS Take by mouth daily.    Marland Kitchen GLUCOSAMINE-CHONDROITIN PO Take 1 tablet by mouth 3 (three) times daily. STRENGTH: 1500-1200    . HYDROcodone-acetaminophen (NORCO/VICODIN) 5-325 MG per tablet Take 1 tablet by mouth every 12 (twelve) hours as needed for moderate pain. 30 tablet 0  . interferon beta-1a (REBIF REBIDOSE) 44 MCG/0.5ML injection Inject  44 mcg Sub-q 3 times weekly Rotate site after each injection 6 mL 12  . levETIRAcetam (KEPPRA) 500 MG tablet Take 1 tablet (500 mg total) by mouth 2  (two) times daily. 60 tablet 12  . Misc Natural Products (LEG VEIN & CIRCULATION) TABS Take by mouth daily.    . Multiple Vitamins-Minerals (MULTIVITAMIN PO) Take 1 tablet by mouth daily.    . naproxen (NAPROSYN) 250 MG tablet Take 500 mg by mouth as needed for mild pain.     Marland Kitchen oxybutynin (DITROPAN XL) 10 MG 24 hr tablet Take 1 tablet (10 mg total) by mouth at bedtime. 90 tablet 3   No current facility-administered medications for this visit.    Review of Systems Review of Systems  Constitutional: Negative.   Respiratory: Negative.   Cardiovascular: Negative.     Blood pressure 122/80, pulse 72, resp. rate 14, height 5\' 4"  (1.626 m), weight 181 lb (82.101 kg).  Physical Exam Physical Exam  Constitutional: She is oriented to person, place, and time. She appears well-developed and well-nourished.  Cardiovascular: Normal rate, regular rhythm and normal heart sounds.   Pulmonary/Chest: Effort normal.    Neurological: She is alert and oriented to person, place, and time.  Skin: Skin is warm and dry.    Data Reviewed No new data.  Assessment    Stage I breast cancer.    Plan    Patient is scheduled for right breast wide excison on 12//28/15. Patient to see Dr. Baruch Gouty on 01/30/14 at 40 'o cloc to better assess whether postoperative radiation therapy will be within the patient's ability to tolerate.  Options if this is not felt to be the case (and she is not a candidate for partial breast radiation) would be to forego radiation or proceed to mastectomy. Final decision will be made the morning of surgery.     PCP/Ref: Matt Holmes 01/27/2014, 8:40 PM

## 2014-01-27 NOTE — Patient Instructions (Signed)
Patient scheduled for surgery on 02/03/14.

## 2014-01-28 ENCOUNTER — Telehealth: Payer: Self-pay

## 2014-01-28 NOTE — Telephone Encounter (Signed)
Patient called and has decided to have a full mastectomy done. She will still go to see Dr Baruch Gouty on 01/30/14. She has spoken to her neurologist about this and feels that this is the best decision for her.

## 2014-02-03 ENCOUNTER — Other Ambulatory Visit: Payer: Self-pay | Admitting: Neurology

## 2014-02-03 ENCOUNTER — Encounter: Payer: Self-pay | Admitting: General Surgery

## 2014-02-03 ENCOUNTER — Ambulatory Visit: Payer: Self-pay | Admitting: General Surgery

## 2014-02-03 DIAGNOSIS — I359 Nonrheumatic aortic valve disorder, unspecified: Secondary | ICD-10-CM | POA: Diagnosis not present

## 2014-02-03 DIAGNOSIS — C50411 Malignant neoplasm of upper-outer quadrant of right female breast: Secondary | ICD-10-CM

## 2014-02-03 DIAGNOSIS — Z9889 Other specified postprocedural states: Secondary | ICD-10-CM | POA: Diagnosis not present

## 2014-02-03 DIAGNOSIS — C50811 Malignant neoplasm of overlapping sites of right female breast: Secondary | ICD-10-CM | POA: Diagnosis not present

## 2014-02-03 DIAGNOSIS — Z9049 Acquired absence of other specified parts of digestive tract: Secondary | ICD-10-CM | POA: Diagnosis not present

## 2014-02-03 DIAGNOSIS — Z803 Family history of malignant neoplasm of breast: Secondary | ICD-10-CM | POA: Diagnosis not present

## 2014-02-03 DIAGNOSIS — M4802 Spinal stenosis, cervical region: Secondary | ICD-10-CM | POA: Diagnosis not present

## 2014-02-03 DIAGNOSIS — C50911 Malignant neoplasm of unspecified site of right female breast: Secondary | ICD-10-CM | POA: Diagnosis not present

## 2014-02-03 DIAGNOSIS — Z882 Allergy status to sulfonamides status: Secondary | ICD-10-CM | POA: Diagnosis not present

## 2014-02-03 DIAGNOSIS — I890 Lymphedema, not elsewhere classified: Secondary | ICD-10-CM | POA: Diagnosis not present

## 2014-02-03 DIAGNOSIS — Z981 Arthrodesis status: Secondary | ICD-10-CM | POA: Diagnosis not present

## 2014-02-03 DIAGNOSIS — R569 Unspecified convulsions: Secondary | ICD-10-CM | POA: Diagnosis not present

## 2014-02-03 DIAGNOSIS — C50919 Malignant neoplasm of unspecified site of unspecified female breast: Secondary | ICD-10-CM | POA: Diagnosis not present

## 2014-02-03 DIAGNOSIS — Z79899 Other long term (current) drug therapy: Secondary | ICD-10-CM | POA: Diagnosis not present

## 2014-02-03 DIAGNOSIS — G35 Multiple sclerosis: Secondary | ICD-10-CM | POA: Diagnosis not present

## 2014-02-03 DIAGNOSIS — Z8249 Family history of ischemic heart disease and other diseases of the circulatory system: Secondary | ICD-10-CM | POA: Diagnosis not present

## 2014-02-03 HISTORY — PX: BREAST SURGERY: SHX581

## 2014-02-04 ENCOUNTER — Telehealth: Payer: Self-pay | Admitting: Diagnostic Neuroimaging

## 2014-02-04 ENCOUNTER — Telehealth: Payer: Self-pay | Admitting: *Deleted

## 2014-02-04 ENCOUNTER — Encounter: Payer: Self-pay | Admitting: General Surgery

## 2014-02-04 NOTE — Telephone Encounter (Signed)
Spoke to patient. Advised message recvd and noted in allergies.

## 2014-02-04 NOTE — Telephone Encounter (Signed)
Pt is calling to state she had a mastectomy on Monday 02/03/14 and she has a new drug allergy which is Fentanyl.  If you have any questions please call.

## 2014-02-04 NOTE — Telephone Encounter (Signed)
She called to make sure she could take her naprosyn or percocet for pain. She stated it was on her discharge papers but she wanted to make sure. Ok.

## 2014-02-06 ENCOUNTER — Encounter: Payer: Self-pay | Admitting: General Surgery

## 2014-02-06 ENCOUNTER — Ambulatory Visit (INDEPENDENT_AMBULATORY_CARE_PROVIDER_SITE_OTHER): Payer: Self-pay | Admitting: General Surgery

## 2014-02-06 VITALS — BP 116/64 | HR 88 | Resp 14 | Ht 64.0 in | Wt 181.0 lb

## 2014-02-06 DIAGNOSIS — C50911 Malignant neoplasm of unspecified site of right female breast: Secondary | ICD-10-CM

## 2014-02-06 NOTE — Progress Notes (Signed)
Patient ID: Rebecca [name], female   DOB: 27-Aug-1960, 53 y.o.   MRN: [id number]  Chief Complaint  Patient presents with  . Routine Post Op    right mastectomy    HPI Rebecca [name] is a 53 y.o. female here today for her post op right mastectomy done on 02/03/14. Patient states she is doing well. Drain sheet present. The patient is having approximately 150 mL per day of sanguinous/serosanguineous drainage. She is experiencing minimal pain.  She is accompanied today by her husband and her aunt.   HPI  Past Medical History  Diagnosis Date  . Herpes zoster   . Multiple sclerosis 2001    Walks from room to room @ home; but Wheelchair when going out.  . Closed head injury with brief loss of consciousness   . Seizures     Takes Keppra  . IBS (irritable bowel syndrome)     no longer  . Bacterial endocarditis     History of .  Marland Kitchen Aortic valve disease     Mild AS / AI (cannot exclude Bicuspid AoV)  . Syncope and collapse   . Bilateral lower extremity edema     Chronic. LE Venous dopplers - negative for DVT.  Marland Kitchen Neuromuscular disorder     MS  . Heart murmur   . Lymphedema     has legs wrapped at [facility]  . Cervical stenosis of spine   . Cancer 12-31-13    Right breast, 12:00, 1.5 cm, T1c,N0 invasive mammary carcinoma, triple negative.    Past Surgical History  Procedure Laterality Date  . Cholecystectomy    . Ankle surgery      Left  . Port-a-cath removal      right  . Anterior cervical decomp/discectomy fusion  11/17/2011    Procedure: ANTERIOR CERVICAL DECOMPRESSION/DISCECTOMY FUSION 2 LEVELS;  Surgeon: Erline Levine, MD;  Location: [place] [facility];  Service: Neurosurgery;  Laterality: N/A;  Cervical Five-Six Six-Seven Anterior cervical decompression/diskectomy/fusion  . Transthoracic echocardiogram  March 15, 2013    Normal LV size and function with EF 60-65%.; Cannot exclude bicuspid aortic valve with mild AS and mild AI.  Marland Kitchen Lower extremity venous dopplers  Feb 27, 2013    No LE  DVT  . Port a cath insertion Right 01/19/2010  . Colonoscopy  2014  . Upper gi endoscopy  2014  . Port-a-cath removal Right 09/03/2013    Procedure: REMOVAL PORT-A-CATH;  Surgeon: Conrad Hugo, MD;  Location: [place];  Service: Vascular;  Laterality: Right;  . Breast biopsy Right 12-31-13    invasive mammary  . Breast surgery Right 02/03/2014    Right simple mastectomy with sentinel node biopsy.    Family History  Problem Relation Age of Onset  . Cancer Father     skin  . Heart disease Father   . Heart attack Father   . Breast cancer Maternal Aunt 60  . Breast cancer Maternal Grandmother 74    Social History History  Substance Use Topics  . Smoking status: Never Smoker   . Smokeless tobacco: Never Used  . Alcohol Use: No    Allergies  Allergen Reactions  . Fentanyl Nausea And Vomiting  . Sulfa Antibiotics Hives and Other (See Comments)    Light headed, over heated    Current Outpatient Prescriptions  Medication Sig Dispense Refill  . amantadine (SYMMETREL) 100 MG capsule Take 100 mg by mouth 3 (three) times daily.    . baclofen (LIORESAL) 10 MG tablet  Take 10-20 mg by mouth 3 (three) times daily. 20mg  in the morning, 10mg  at noon and 20mg  at bedtime.    . Cholecalciferol (VITAMIN D3) 2000 UNITS capsule Take 2,000 Units by mouth daily.    . Cranberry 1000 MG CAPS Take by mouth daily.    Marland Kitchen GLUCOSAMINE-CHONDROITIN PO Take 1 tablet by mouth 3 (three) times daily. STRENGTH: 1500-1200    . interferon beta-1a (REBIF REBIDOSE) 44 MCG/0.5ML injection Inject  44 mcg Sub-q 3 times weekly Rotate site after each injection 6 mL 12  . levETIRAcetam (KEPPRA) 500 MG tablet Take 1 tablet (500 mg total) by mouth 2 (two) times daily. 60 tablet 12  . Misc Natural Products (LEG VEIN & CIRCULATION) TABS Take by mouth daily.    . Multiple Vitamins-Minerals (MULTIVITAMIN PO) Take 1 tablet by mouth daily.    . naproxen (NAPROSYN) 250 MG tablet Take 500 mg by mouth as needed for mild pain.     Marland Kitchen  oxybutynin (DITROPAN-XL) 10 MG 24 hr tablet TAKE 1 TABLET (10 MG TOTAL) BY MOUTH AT BEDTIME. 90 tablet 2   No current facility-administered medications for this visit.    Review of Systems Review of Systems  Constitutional: Negative.   Respiratory: Negative.   Cardiovascular: Negative.     Blood pressure 116/64, pulse 88, resp. rate 14, height 5\' 4"  (1.626 m), weight 181 lb (82.101 kg).  Physical Exam Physical Exam  Constitutional: She is oriented to person, place, and time. She appears well-developed and well-nourished.  Pulmonary/Chest:  Incision healing well.  Neurological: She is alert and oriented to person, place, and time.  Skin: Skin is warm and dry.  Moderate bruising both above and below the previously placed compressive wrap with some extension into the right upper medial arm is noted.  A new compressive wrap was applied by the nurse.    Data Reviewed 1.5 cm triple negative tumor. Negative sentinel node. A second 4 mm primary suggested on ultrasound has not been identified as of this time.  Platelet count prior to surgery was 118,000. Assessment    Doing well status post right mastectomy for T1c disease.    Plan  The patient has experienced more bruising than usual, this is likely related to her low platelet count. Her MS medications were not held prior to surgery as she reported profound loss of energy when off interferon. I do not anticipate any significant bleeding or need for additional treatments outside of maintenance of her drain.  Indication for medical oncology assessment was reviewed. She is amenable.    Continue drain record.   Follow up Monday for dressing change.  This patient has been scheduled for an appointment with Dr. Grayland Ormond at the [facility] for 02-11-14 at 10 am.  PCP:  Lynnell Dike, [place] Gleason 02/06/2014, 2:05 PM

## 2014-02-06 NOTE — Patient Instructions (Addendum)
The patient is aware to call back for any questions or concerns. Continue drain record.  Appointment with Medical Oncologist. Follow up Monday for dressing change.  This patient has been scheduled for an appointment with Dr. Grayland Ormond at the [facility] for 02-11-14 at 10 am.

## 2014-02-10 ENCOUNTER — Ambulatory Visit (INDEPENDENT_AMBULATORY_CARE_PROVIDER_SITE_OTHER): Payer: Self-pay | Admitting: General Surgery

## 2014-02-10 ENCOUNTER — Encounter: Payer: Self-pay | Admitting: General Surgery

## 2014-02-10 VITALS — BP 130/74 | HR 72 | Resp 14 | Ht 64.0 in | Wt 181.0 lb

## 2014-02-10 DIAGNOSIS — C50911 Malignant neoplasm of unspecified site of right female breast: Secondary | ICD-10-CM

## 2014-02-10 NOTE — Patient Instructions (Addendum)
Patient may shower on Wednesday. Nurse on Thursday.

## 2014-02-10 NOTE — Progress Notes (Signed)
Patient ID: Rebecca [name], female   DOB: Feb 21, 1960, 54 y.o.   MRN: [id number]  Chief Complaint  Patient presents with  . Routine Post Op    right mastectomy    HPI Rebecca [name] is a 54 y.o. female her post op right mastectomy done on 02/03/14. Patient states she is doing well.  HPI  Past Medical History  Diagnosis Date  . Herpes zoster   . Multiple sclerosis 2001    Walks from room to room @ home; but Wheelchair when going out.  . Closed head injury with brief loss of consciousness   . Seizures     Takes Keppra  . IBS (irritable bowel syndrome)     no longer  . Bacterial endocarditis     History of .  Marland Kitchen Aortic valve disease     Mild AS / AI (cannot exclude Bicuspid AoV)  . Syncope and collapse   . Bilateral lower extremity edema     Chronic. LE Venous dopplers - negative for DVT.  Marland Kitchen Neuromuscular disorder     MS  . Heart murmur   . Lymphedema     has legs wrapped at [facility]  . Cervical stenosis of spine   . Cancer 12-31-13    Right breast, 12:00, 1.5 cm, T1c,N0 invasive mammary carcinoma, triple negative.    Past Surgical History  Procedure Laterality Date  . Cholecystectomy    . Ankle surgery      Left  . Port-a-cath removal      right  . Anterior cervical decomp/discectomy fusion  11/17/2011    Procedure: ANTERIOR CERVICAL DECOMPRESSION/DISCECTOMY FUSION 2 LEVELS;  Surgeon: Erline Levine, MD;  Location: [facility];  Service: Neurosurgery;  Laterality: N/A;  Cervical Five-Six Six-Seven Anterior cervical decompression/diskectomy/fusion  . Transthoracic echocardiogram  March 15, 2013    Normal LV size and function with EF 60-65%.; Cannot exclude bicuspid aortic valve with mild AS and mild AI.  Marland Kitchen Lower extremity venous dopplers  Feb 27, 2013    No LE DVT  . Port a cath insertion Right 01/19/2010  . Colonoscopy  2014  . Upper gi endoscopy  2014  . Port-a-cath removal Right 09/03/2013    Procedure: REMOVAL PORT-A-CATH;  Surgeon: Conrad Lincoln, MD;  Location: [place];   Service: Vascular;  Laterality: Right;  . Breast biopsy Right 12-31-13    invasive mammary  . Breast surgery Right 02/03/2014    Right simple mastectomy with sentinel node biopsy.    Family History  Problem Relation Age of Onset  . Cancer Father     skin  . Heart disease Father   . Heart attack Father   . Breast cancer Maternal Aunt 60  . Breast cancer Maternal Grandmother 74    Social History History  Substance Use Topics  . Smoking status: Never Smoker   . Smokeless tobacco: Never Used  . Alcohol Use: No    Allergies  Allergen Reactions  . Fentanyl Nausea And Vomiting  . Sulfa Antibiotics Hives and Other (See Comments)    Light headed, over heated    Current Outpatient Prescriptions  Medication Sig Dispense Refill  . amantadine (SYMMETREL) 100 MG capsule Take 100 mg by mouth 3 (three) times daily.    . baclofen (LIORESAL) 10 MG tablet Take 10-20 mg by mouth 3 (three) times daily. 20mg  in the morning, 10mg  at noon and 20mg  at bedtime.    . Cholecalciferol (VITAMIN D3) 2000 UNITS capsule Take 2,000 Units by mouth  daily.    . Cranberry 1000 MG CAPS Take by mouth daily.    Marland Kitchen GLUCOSAMINE-CHONDROITIN PO Take 1 tablet by mouth 3 (three) times daily. STRENGTH: 1500-1200    . interferon beta-1a (REBIF REBIDOSE) 44 MCG/0.5ML injection Inject  44 mcg Sub-q 3 times weekly Rotate site after each injection 6 mL 12  . levETIRAcetam (KEPPRA) 500 MG tablet Take 1 tablet (500 mg total) by mouth 2 (two) times daily. 60 tablet 12  . Misc Natural Products (LEG VEIN & CIRCULATION) TABS Take by mouth daily.    . naproxen (NAPROSYN) 250 MG tablet Take 500 mg by mouth as needed for mild pain.     Marland Kitchen oxybutynin (DITROPAN-XL) 10 MG 24 hr tablet TAKE 1 TABLET (10 MG TOTAL) BY MOUTH AT BEDTIME. 90 tablet 2  . Multiple Vitamins-Minerals (MULTIVITAMIN PO) Take 1 tablet by mouth daily.     No current facility-administered medications for this visit.    Review of Systems Review of Systems   Constitutional: Negative.   Respiratory: Negative.   Cardiovascular: Negative.     Blood pressure 130/74, pulse 72, resp. rate 14, height 5\' 4"  (1.626 m), weight 181 lb (82.101 kg).  Physical Exam Physical Exam  Constitutional: She appears well-developed and well-nourished.  Eyes: Conjunctivae are normal. No scleral icterus.  Neck: Neck supple.  Lymphadenopathy:    She has no cervical adenopathy.  Mastectomy site with resolving ecchymosis. No loculated fluid. Drain site clean. Right shoulder range of motion is approximately 110 of abduction.  Data Reviewed Drainage record shows 90-110 mL per day, improved from last week. Fluid is primarily bloody, possibly due to ongoing use of Naprosyn.  Assessment    Triple negative cancer of the right breast, doing well post mastectomy.       Plan    The patient will remove the compressive wrap applied today on Wednesday evening and shower. She was instructed on opportunities to support the drain while bathing. She'll put on her camisole top after this.  She'll follow up with the nurse in 3 days for reassessment, and make use of Tylenol or the narcotic prescribed postop for pain and place of Naprosyn to see if this does not resolve the bloody drainage noted today. As her blood pressure is up and her pulse is down from her visit last week, we will not plan on getting a repeat CBC today.  The patient does have an appointment in [place] on Thursday afternoon and she'll either come before or after this if possible for nursing assessment.  She scheduled to meet with medical oncology tomorrow.        PCP: Matt Holmes 02/10/2014, 1:58 PM

## 2014-02-11 ENCOUNTER — Ambulatory Visit: Payer: Self-pay | Admitting: Oncology

## 2014-02-11 ENCOUNTER — Encounter: Payer: Self-pay | Admitting: General Surgery

## 2014-02-11 DIAGNOSIS — Z9011 Acquired absence of right breast and nipple: Secondary | ICD-10-CM | POA: Diagnosis not present

## 2014-02-11 DIAGNOSIS — Z171 Estrogen receptor negative status [ER-]: Secondary | ICD-10-CM | POA: Diagnosis not present

## 2014-02-11 DIAGNOSIS — C50911 Malignant neoplasm of unspecified site of right female breast: Secondary | ICD-10-CM | POA: Diagnosis not present

## 2014-02-11 DIAGNOSIS — G35 Multiple sclerosis: Secondary | ICD-10-CM | POA: Diagnosis not present

## 2014-02-11 DIAGNOSIS — C50919 Malignant neoplasm of unspecified site of unspecified female breast: Secondary | ICD-10-CM | POA: Diagnosis not present

## 2014-02-11 DIAGNOSIS — Z803 Family history of malignant neoplasm of breast: Secondary | ICD-10-CM | POA: Diagnosis not present

## 2014-02-11 DIAGNOSIS — Z801 Family history of malignant neoplasm of trachea, bronchus and lung: Secondary | ICD-10-CM | POA: Diagnosis not present

## 2014-02-11 DIAGNOSIS — Z808 Family history of malignant neoplasm of other organs or systems: Secondary | ICD-10-CM | POA: Diagnosis not present

## 2014-02-11 DIAGNOSIS — Z79899 Other long term (current) drug therapy: Secondary | ICD-10-CM | POA: Diagnosis not present

## 2014-02-11 DIAGNOSIS — K589 Irritable bowel syndrome without diarrhea: Secondary | ICD-10-CM | POA: Diagnosis not present

## 2014-02-13 ENCOUNTER — Ambulatory Visit (INDEPENDENT_AMBULATORY_CARE_PROVIDER_SITE_OTHER): Payer: Self-pay | Admitting: *Deleted

## 2014-02-13 ENCOUNTER — Ambulatory Visit (INDEPENDENT_AMBULATORY_CARE_PROVIDER_SITE_OTHER): Payer: Medicare Other | Admitting: Diagnostic Neuroimaging

## 2014-02-13 ENCOUNTER — Encounter: Payer: Self-pay | Admitting: Diagnostic Neuroimaging

## 2014-02-13 VITALS — BP 129/78 | HR 84

## 2014-02-13 DIAGNOSIS — C50911 Malignant neoplasm of unspecified site of right female breast: Secondary | ICD-10-CM

## 2014-02-13 DIAGNOSIS — G35 Multiple sclerosis: Secondary | ICD-10-CM | POA: Diagnosis not present

## 2014-02-13 NOTE — Patient Instructions (Signed)
Continue current medications. 

## 2014-02-13 NOTE — Progress Notes (Signed)
Patient came in today for a unwrap and dressing change.

## 2014-02-13 NOTE — Progress Notes (Signed)
[facility]  PATIENT: Rebecca [name] DOB: [birth year]  REFERRING CLINICIAN:  HISTORY FROM: patient, husband, aunt REASON FOR VISIT: follow up   HISTORICAL  CHIEF COMPLAINT:  Chief Complaint  Patient presents with  . Multiple Sclerosis    HISTORY OF PRESENT ILLNESS:   UPDATE 02/13/13: Since last visit, has been diagnosed with triple negative breast CA, s/p right mastectomy. Now planning to have chemotherapy. No new neuro symptoms. Tolerating rebif.   UPDATE 11/13/13: Since last visit, doing well. Getting wrap / lymphedema therapy via Amy (OT) at [facility] with great results. Significant improvement. Tolerating rebif. No new lesions or symptoms.  UPDATE 08/14/13: Since last visit, has continued on rebif. BLE edema still a problem. Saw cardiology, tried diuretic, and also trying wrap/massage therapy next week. Walking and mobility gradually worsening.   UPDATE 02/13/13 (LL): The patient returns for followup visit. Since last visit she states that she has seen Dr. Katy Fitch and that she needs new prescription eyeglasses for vision problems that the double vision resolved. She is tolerating rebif. Her biggest concern is that her bilateral leg edema is making it difficult for her to move her legs or ambulate. She is not consistently wearing her TED hose. She denies any shortness of breath, but is mostly wheelchair-bound. She states that she feels like she is progressively getting weaker. She asked for information about [employer]. She said she had a cardiology consultation in the past when she started Gilenya, but that Dr. has since retired. She states she was diagnosed with aortic insufficiency. Her urgency and frequency of urination is getting worse, she asked if she can be increased on her medication.   UPDATE 11/07/12: Since last visit, doing well, until 1 month ago and developed fluctuating double vision/blurred vision. Affects right greater than left eye. It is present even if she  closes the right or left eyes.   UPDATE 04/20/12: Since last visit, has come off gabapentin. BLE swelling stable. S/p ACDF surg per Dr. Vertell Limber and doing well. Tolerating rebif. Has new PCP and also getting PT. Few days ago, developed some numbness in right hand, similar to pre-op symptoms which improved post-op. No neck pain.   UPDATE 01/10/12: Noticed worsening swelling to bilateral lower extremities since 01/04/12. She is also having throbbing pain to shins. Difficulty with wearing sneakers. Unable to use leg compression machine. Denies shortness of breath.   UPDATE 10/27/11: Having surgery on c-spine fusion on November 17, 2011. Concerned about recovery time and her ability to perform ADL's. Lives with aunt. Wanting to know if she needs to have someone come to home or stay at rehab. Intermittent blurred vision las 3 weeks.   UPDATE 07/27/11: Doing better with shingles and post-herpetic neuralgia pain; on gralise. Now with worsening RLE weakness, fatigue, vision changes, speech diff. Discussed tx options.   UPDATE 06/02/11: On 05/27/11 developed right lower tooth pain; 4/20 developed blisters and rash on right chin and jaw. 4/22 went to dentist, diagnosed with tooth infx and given amoxicillin. Rash more significant. 06/01/11, went to dermatologist and dx'd possible shingles. Given valtrex and started on 4/24. Lesions still oozing. Has right ear and right facial pain, severe. No eye pain.   UPDATE 03/02/10: Patient was getting worked up for first dose observation for Gilenya, however on 02/28/2011, patient had a suspected seizure. She was in the bathroom at home, stood up and without warning have loss of consciousness. Her mother found her on the floor groaning with some twitching movements of her  upper extremities, and her legs outstretched in front of her. No incontinence or tongue biting. It took approximately 20-30 minutes for her to wake up. She was amnestic events until the next day.  In retrospect, patient  had 4 or 5 episodes of syncope versus seizure in the early 1980s. She was never on antiseizure medication. Patient was also on Ampyra since past 2 years.   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 Last dose tysabri on 10/29/10. She would like to switch to [place]. No MS flares. Some increased fatigue. Also asking if amantadine can be stopped to see if peripheral edema improves.   PRIOR HPI (11/02/10): 54 year old right-handed female with history of multiple sclerosis. Here for transfer of care from [place]. 2001 - patient developed left leg numbness and weakness, followed by right facial weakness and slurred speech. She was evaluated with MRI of the brain, diagnosed with multiple sclerosis and started on Copaxone. She did not have lumbar puncture or spinal cord MRI. 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 She thinks she was off of Copaxone for approximately 2 years. 2008-9 - patient was evaluated at [facility] and started on Tysabri. She thinks she has been on Tysabri for approximately 3-1/2 years. Last dose was last week at [facility]. She continues to decline in terms of mobility. She is to walk using a cane, then started using a walker, now uses a transport chair occasionally. Patient continues to have intermittent right facial twitching and numbness. She is on amantadine, baclofen, Amprya, and oxybutynin for MS symptoms.    REVIEW OF SYSTEMS: Full 14 system review of systems performed and notable only for leg swelling murmur fatigue.   ALLERGIES: Allergies  Allergen Reactions  . Fentanyl Nausea And Vomiting  . Sulfa Antibiotics Hives and Other (See Comments)    Light headed, over heated    HOME MEDICATIONS: Outpatient Prescriptions Prior to Visit  Medication Sig Dispense Refill  . amantadine (SYMMETREL) 100 MG capsule Take 100 mg by mouth 3 (three) times daily.    . baclofen (LIORESAL) 10 MG tablet Take 10-20 mg by mouth 3  (three) times daily. 20mg  in the morning, 10mg  at noon and 20mg  at bedtime.    . Cholecalciferol (VITAMIN D3) 2000 UNITS capsule Take 2,000 Units by mouth daily.    . Cranberry 1000 MG CAPS Take by mouth daily.    Marland Kitchen GLUCOSAMINE-CHONDROITIN PO Take 1 tablet by mouth 3 (three) times daily. STRENGTH: 1500-1200    . interferon beta-1a (REBIF REBIDOSE) 44 MCG/0.5ML injection Inject  44 mcg Sub-q 3 times weekly Rotate site after each injection 6 mL 12  . levETIRAcetam (KEPPRA) 500 MG tablet Take 1 tablet (500 mg total) by mouth 2 (two) times daily. 60 tablet 12  . Misc Natural Products (LEG VEIN & CIRCULATION) TABS Take by mouth daily.    . Multiple Vitamins-Minerals (MULTIVITAMIN PO) Take 1 tablet by mouth daily.    Marland Kitchen oxybutynin (DITROPAN-XL) 10 MG 24 hr tablet TAKE 1 TABLET (10 MG TOTAL) BY MOUTH AT BEDTIME. 90 tablet 2  . naproxen (NAPROSYN) 250 MG tablet Take 500 mg by mouth as needed for mild pain.      No facility-administered medications prior to visit.    PAST MEDICAL HISTORY: Past Medical History  Diagnosis Date  . Herpes zoster   . Multiple sclerosis 2001    Walks from room to room @ home; but Wheelchair when going out.  . Closed head  injury with brief loss of consciousness   . Seizures     Takes Keppra  . IBS (irritable bowel syndrome)     no longer  . Bacterial endocarditis     History of .  Marland Kitchen Aortic valve disease     Mild AS / AI (cannot exclude Bicuspid AoV)  . Syncope and collapse   . Bilateral lower extremity edema     Chronic. LE Venous dopplers - negative for DVT.  Marland Kitchen Neuromuscular disorder     MS  . Heart murmur   . Lymphedema     has legs wrapped at [facility]  . Cervical stenosis of spine   . Cancer 12-31-13    Right breast, 12:00, 1.5 cm, T1c,N0 invasive mammary carcinoma, triple negative.    PAST SURGICAL HISTORY: Past Surgical History  Procedure Laterality Date  . Cholecystectomy    . Ankle surgery      Left  . Port-a-cath removal      right  . Anterior  cervical decomp/discectomy fusion  11/17/2011    Procedure: ANTERIOR CERVICAL DECOMPRESSION/DISCECTOMY FUSION 2 LEVELS;  Surgeon: Erline Levine, MD;  Location: [place] [facility];  Service: Neurosurgery;  Laterality: N/A;  Cervical Five-Six Six-Seven Anterior cervical decompression/diskectomy/fusion  . Transthoracic echocardiogram  March 15, 2013    Normal LV size and function with EF 60-65%.; Cannot exclude bicuspid aortic valve with mild AS and mild AI.  Marland Kitchen Lower extremity venous dopplers  Feb 27, 2013    No LE DVT  . Port a cath insertion Right 01/19/2010  . Colonoscopy  2014  . Upper gi endoscopy  2014  . Port-a-cath removal Right 09/03/2013    Procedure: REMOVAL PORT-A-CATH;  Surgeon: Conrad , MD;  Location: [place];  Service: Vascular;  Laterality: Right;  . Breast biopsy Right 12-31-13    invasive mammary  . Breast surgery Right 02/03/2014    Right simple mastectomy with sentinel node biopsy.    FAMILY HISTORY: Family History  Problem Relation Age of Onset  . Cancer Father     skin  . Heart disease Father   . Heart attack Father   . Breast cancer Maternal Aunt 60  . Breast cancer Maternal Grandmother 74    SOCIAL HISTORY:  History   Social History  . Marital Status: Married    Spouse Name: don    Number of Children: 0  . Years of Education: 12   Occupational History  . disability    Social History Main Topics  . Smoking status: Never Smoker   . Smokeless tobacco: Never Used  . Alcohol Use: No  . Drug Use: No  . Sexual Activity:    Partners: Male   Other Topics Concern  . Not on file   Social History Narrative   She is married. Recently moved back to [place] after being in [place] for some time. She is accompanied by her husband and aunt.   Never smoked. Never used alcohol.     PHYSICAL EXAM  Filed Vitals:   02/13/14 1437  BP: 129/78  Pulse: 84    Not recorded      Cannot calculate BMI with a height equal to zero.  GENERAL EXAM:    General: Patient is awake, alert and in no acute distress. Well developed and groomed.  Neck: Neck is supple.  Cardiovascular: Heart is regular rate and rhythm with mild systolic murmur. PERIPHERAL EDEMA IN BLE.   Neurologic Exam  Mental Status: Awake, alert. Language is fluent  and comprehension intact. Cranial Nerves: Pupils are equal and reactive to light. Visual fields are full to confrontation. Conjugate eye movements are full and symmetric. Facial sensation and strength are symmetric. Hearing is intact. Palate elevated symmetrically and uvula is midline. Shoulder shrug is symmetric. Tongue is midline.  Motor: Normal bulk and tone. Full strength in the upper extremities; BLE (2/5 prox, 3/5 distal), INCREASED TONE IN BLE. No pronator drift.  Sensory: Intact and symmetric to light touch.  Coordination: FTN SMOOTH Reflexes: Deep tendon reflexes in the upper and lower extremity are present and symmetric; TRACE AT KNEES AND ANKLES. PERIPHERAL EDEMA. Gait and Station: [place].     DIAGNOSTIC DATA (LABS, IMAGING, TESTING) - I reviewed patient records, labs, notes, testing and imaging myself where available.  Lab Results  Component Value Date   WBC 3.4 01/09/2014   HGB 11.5 01/09/2014   HCT 34.2 01/09/2014   MCV 91 01/09/2014   PLT 118* 01/09/2014      Component Value Date/Time   NA 141 01/09/2014 0930   NA 140 09/03/2013 0803   K 4.5 01/09/2014 0930   CL 100 01/09/2014 0930   CO2 29 01/09/2014 0930   GLUCOSE 86 01/09/2014 0930   GLUCOSE 85 09/03/2013 0803   BUN 20 01/09/2014 0930   BUN 17 11/11/2011 1019   CREATININE 0.54* 01/09/2014 0930   CALCIUM 9.7 01/09/2014 0930   PROT 6.9 01/09/2014 0930   AST 23 01/09/2014 0930   ALT 17 01/09/2014 0930   ALKPHOS 87 01/09/2014 0930   BILITOT 0.3 01/09/2014 0930   GFRNONAA 108 01/09/2014 0930   GFRAA 125 01/09/2014 0930   No results found for: CHOL No results found for: HGBA1C No results found for: VITAMINB12 No results  found for: TSH  I reviewed images myself and agree with interpretation. -VRP  08/21/13 MRI brain (with and without) demonstrating:  1. Multiple periventricular and subcortical chronic demyelinating plaques. Some of these are confluent. Some are hypointense on T1.  2. No acute plaques.  3. No significant change from MRI on 11/20/12.   08/21/13 MRI cervical spine (with and without) demonstrating:  1. At C2-3: disc bulging and facet hypertrophy with mild spinal stenosis and mild left foraminal stenosis.  2. At C5-6: uncovertebral joint hypertrophy with mild right foraminal stenosis.  3. Multiple chronic demyelinating plaques from C2 to T1. No abnormal enhancing lesions.  4. Compared to MRI from 10/04/11, there has been progression of degenerative spine disease at C2-3. Also there has been interval ACDF from C5-C7. No significant change in spinal cord lesions.  08/21/13 MRI thoracic spine (with and without) demonstrating:  1. Multiple spinal cord chronic demyelinating plaques from C7 to T12-L1.  2. No acute plaques.  3. ACDF at C5-C7.  4. Compared to prior MRI from 08/03/11, the ACDF is new. Overall no new demyelinating plaques.    ASSESSMENT AND PLAN  54 y.o. year old female here with multiple sclerosis, initially on copaxone, then was on Tysabri in the past (3-1/2 years); then became JCV +. Last dose tysabri 10/29/10. Switched to [place] on 04/25/11. Then with right V2, V3 herpes zoster on 05/27/11. Gilenya stopped. Shingles resolved. Now on rebif. Also s/p ACDF C5-6 (Oct 2013) for spinal stenosis. Now with breast CA diagnosis, s/p right mastectomy. Planning to have chemotherapy.  PLAN:  1. continue rebif, until planned chemotherapy. Advised that breast CA treatment should have priority over MS treatment, even if she needs to stop rebif. We can treat MS flares during chemo  with solumedrol therapy.   Return in about 3 months (around 05/15/2014).    Penni Bombard, MD 06/13/3891, 7:34  PM Certified in Neurology, Neurophysiology and Neuroimaging  [facility] [address] [place], [place] [address] [phone]

## 2014-02-17 ENCOUNTER — Other Ambulatory Visit: Payer: Medicare Other

## 2014-02-20 ENCOUNTER — Ambulatory Visit: Payer: Medicare Other | Admitting: General Surgery

## 2014-02-24 ENCOUNTER — Encounter: Payer: Self-pay | Admitting: General Surgery

## 2014-02-24 ENCOUNTER — Ambulatory Visit (INDEPENDENT_AMBULATORY_CARE_PROVIDER_SITE_OTHER): Payer: Self-pay | Admitting: General Surgery

## 2014-02-24 VITALS — BP 132/74 | HR 72 | Resp 14 | Ht 64.0 in | Wt 181.0 lb

## 2014-02-24 DIAGNOSIS — C50911 Malignant neoplasm of unspecified site of right female breast: Secondary | ICD-10-CM

## 2014-02-24 NOTE — Patient Instructions (Addendum)
Patient to return in 7- 10 days.  Patient's surgery has been scheduled for 03-13-14 at [facility].

## 2014-02-24 NOTE — Progress Notes (Signed)
Patient ID: Rebecca [name], female   DOB: [birth year], 54 y.o.   MRN: [id number]  Chief Complaint  Patient presents with  . Other    breast cancer and discuss port placement    HPI Rebecca [name] is a 54 y.o. female here today for her follow up breast cancer. Patient is here today to discuss port placement.  HPI  Past Medical History  Diagnosis Date  . Herpes zoster   . Multiple sclerosis 2001    Walks from room to room @ home; but Wheelchair when going out.  . Closed head injury with brief loss of consciousness   . Seizures     Takes Keppra  . IBS (irritable bowel syndrome)     no longer  . Bacterial endocarditis     History of .  Marland Kitchen Aortic valve disease     Mild AS / AI (cannot exclude Bicuspid AoV)  . Syncope and collapse   . Bilateral lower extremity edema     Chronic. LE Venous dopplers - negative for DVT.  Marland Kitchen Neuromuscular disorder     MS  . Heart murmur   . Lymphedema     has legs wrapped at [facility]  . Cervical stenosis of spine   . Cancer 12-31-13    Right breast, 12:00, 1.5 cm, T1c,N0 invasive mammary carcinoma, triple negative.    Past Surgical History  Procedure Laterality Date  . Cholecystectomy    . Ankle surgery      Left  . Port-a-cath removal      right  . Anterior cervical decomp/discectomy fusion  11/17/2011    Procedure: ANTERIOR CERVICAL DECOMPRESSION/DISCECTOMY FUSION 2 LEVELS;  Surgeon: Erline Levine, MD;  Location: [place] [facility];  Service: Neurosurgery;  Laterality: N/A;  Cervical Five-Six Six-Seven Anterior cervical decompression/diskectomy/fusion  . Transthoracic echocardiogram  March 15, 2013    Normal LV size and function with EF 60-65%.; Cannot exclude bicuspid aortic valve with mild AS and mild AI.  Marland Kitchen Lower extremity venous dopplers  Feb 27, 2013    No LE DVT  . Port a cath insertion Right 01/19/2010  . Colonoscopy  2014  . Upper gi endoscopy  2014  . Port-a-cath removal Right 09/03/2013    Procedure: REMOVAL PORT-A-CATH;  Surgeon: Conrad Demopolis, MD;  Location: [place];  Service: Vascular;  Laterality: Right;  . Breast biopsy Right 12-31-13    invasive mammary  . Breast surgery Right 02/03/2014    Right simple mastectomy with sentinel node biopsy.    Family History  Problem Relation Age of Onset  . Cancer Father     skin  . Heart disease Father   . Heart attack Father   . Breast cancer Maternal Aunt 60  . Breast cancer Maternal Grandmother 74    Social History History  Substance Use Topics  . Smoking status: Never Smoker   . Smokeless tobacco: Never Used  . Alcohol Use: No    Allergies  Allergen Reactions  . Fentanyl Nausea And Vomiting  . Sulfa Antibiotics Hives and Other (See Comments)    Light headed, over heated    Current Outpatient Prescriptions  Medication Sig Dispense Refill  . acetaminophen (TYLENOL) 500 MG tablet Take 1,000 mg by mouth every 6 (six) hours as needed.    Marland Kitchen amantadine (SYMMETREL) 100 MG capsule Take 100 mg by mouth 3 (three) times daily.    . baclofen (LIORESAL) 10 MG tablet Take 10-20 mg by mouth 3 (three) times daily. 20mg  in  the morning, 10mg  at noon and 20mg  at bedtime.    . Cholecalciferol (VITAMIN D3) 2000 UNITS capsule Take 2,000 Units by mouth daily.    . Cranberry 1000 MG CAPS Take by mouth daily.    Marland Kitchen GLUCOSAMINE-CHONDROITIN PO Take 1 tablet by mouth 3 (three) times daily. STRENGTH: 1500-1200    . interferon beta-1a (REBIF REBIDOSE) 44 MCG/0.5ML injection Inject  44 mcg Sub-q 3 times weekly Rotate site after each injection 6 mL 12  . levETIRAcetam (KEPPRA) 500 MG tablet Take 1 tablet (500 mg total) by mouth 2 (two) times daily. 60 tablet 12  . Misc Natural Products (LEG VEIN & CIRCULATION) TABS Take by mouth daily.    . Multiple Vitamins-Minerals (MULTIVITAMIN PO) Take 1 tablet by mouth daily.    . naproxen (NAPROSYN) 250 MG tablet Take 500 mg by mouth as needed for mild pain.     Marland Kitchen oxybutynin (DITROPAN-XL) 10 MG 24 hr tablet TAKE 1 TABLET (10 MG TOTAL) BY MOUTH AT BEDTIME. 90  tablet 2   No current facility-administered medications for this visit.    Review of Systems Review of Systems  Constitutional: Negative.   Respiratory: Negative.   Cardiovascular: Negative.     Blood pressure 132/74, pulse 72, resp. rate 14, height 5\' 4"  (1.626 m), weight 181 lb (82.101 kg).  Physical Exam Physical Exam  Constitutional: She is oriented to person, place, and time. She appears well-developed and well-nourished.  Eyes: Conjunctivae are normal. No scleral icterus.  Neck: Neck supple.  Cardiovascular: Normal rate and regular rhythm.   Murmur heard.  Systolic murmur is present with a grade of 2/6  Pulmonary/Chest: Effort normal and breath sounds normal.  Left mastectomy site looks clean and healing .   Abdominal: Soft. Bowel sounds are normal.  Neurological: She is alert and oriented to person, place, and time.  Skin: Skin is warm and dry.  The patient exhibits excellent shoulder range of motion. Drainage follow him as decreased nicely. The drain was removed without incident. A dry dressing with Tegaderm was applied.   Data Reviewed Medical oncology as recommended adjuvant chemotherapy. PowerPort requested.  Assessment    Doing well status post mastectomy. Excellent shoulder range of motion.    Plan   Patient to return in 10 days.     This patient's surgery has been scheduled for 03-13-14 at [facility].   PCP: Matt Holmes 02/25/2014, 7:46 PM

## 2014-02-25 ENCOUNTER — Ambulatory Visit (INDEPENDENT_AMBULATORY_CARE_PROVIDER_SITE_OTHER): Payer: Self-pay | Admitting: *Deleted

## 2014-02-25 ENCOUNTER — Other Ambulatory Visit: Payer: Self-pay | Admitting: General Surgery

## 2014-02-25 DIAGNOSIS — C50911 Malignant neoplasm of unspecified site of right female breast: Secondary | ICD-10-CM

## 2014-02-25 NOTE — Progress Notes (Signed)
Patient came in today for a wound check/dressing change.  The wound is clean, with no signs of infection noted. Instructed to use 4x4 gauze and tape until drainage lessens. Then gradully she can use a bandaid when drainage is less. Follow up as scheduled.

## 2014-03-03 ENCOUNTER — Ambulatory Visit: Payer: Medicare Other | Admitting: General Surgery

## 2014-03-04 ENCOUNTER — Other Ambulatory Visit: Payer: Medicare Other

## 2014-03-05 ENCOUNTER — Telehealth: Payer: Self-pay | Admitting: Diagnostic Neuroimaging

## 2014-03-05 DIAGNOSIS — G35 Multiple sclerosis: Secondary | ICD-10-CM | POA: Diagnosis not present

## 2014-03-05 DIAGNOSIS — Z9011 Acquired absence of right breast and nipple: Secondary | ICD-10-CM | POA: Diagnosis not present

## 2014-03-05 DIAGNOSIS — K589 Irritable bowel syndrome without diarrhea: Secondary | ICD-10-CM | POA: Diagnosis not present

## 2014-03-05 DIAGNOSIS — C50911 Malignant neoplasm of unspecified site of right female breast: Secondary | ICD-10-CM | POA: Diagnosis not present

## 2014-03-05 DIAGNOSIS — Z171 Estrogen receptor negative status [ER-]: Secondary | ICD-10-CM | POA: Diagnosis not present

## 2014-03-05 DIAGNOSIS — Z79899 Other long term (current) drug therapy: Secondary | ICD-10-CM | POA: Diagnosis not present

## 2014-03-05 NOTE — Telephone Encounter (Signed)
I was called by Dr. Grayland Ormond (oncology, [facility]) re: Rebecca [name]'s breast cancer dx and treatment plan. She will stop rebif and then start chemotherapy (anticipated course of 5 months). Appreciate Dr. Gary Fleet input and collaboration in [place] care.  Penni Bombard, MD 8/88/9169, 45:03 AM Certified in Neurology, Neurophysiology and Neuroimaging  [facility] [address] [place], [place] [address] [phone]

## 2014-03-06 ENCOUNTER — Other Ambulatory Visit: Payer: Self-pay

## 2014-03-06 ENCOUNTER — Ambulatory Visit (INDEPENDENT_AMBULATORY_CARE_PROVIDER_SITE_OTHER): Payer: Self-pay | Admitting: General Surgery

## 2014-03-06 ENCOUNTER — Encounter: Payer: Self-pay | Admitting: General Surgery

## 2014-03-06 ENCOUNTER — Other Ambulatory Visit (INDEPENDENT_AMBULATORY_CARE_PROVIDER_SITE_OTHER): Payer: Medicare Other

## 2014-03-06 VITALS — BP 130/70 | HR 74 | Resp 14 | Ht 64.0 in | Wt 181.0 lb

## 2014-03-06 DIAGNOSIS — T888XXD Other specified complications of surgical and medical care, not elsewhere classified, subsequent encounter: Secondary | ICD-10-CM

## 2014-03-06 DIAGNOSIS — I359 Nonrheumatic aortic valve disorder, unspecified: Secondary | ICD-10-CM | POA: Diagnosis not present

## 2014-03-06 DIAGNOSIS — C50911 Malignant neoplasm of unspecified site of right female breast: Secondary | ICD-10-CM

## 2014-03-06 DIAGNOSIS — IMO0002 Reserved for concepts with insufficient information to code with codable children: Secondary | ICD-10-CM | POA: Insufficient documentation

## 2014-03-06 NOTE — Progress Notes (Signed)
Quick Note:  Echo results: Good news: Essentially normal echocardiogram and normal pump function and normal valve function. No regional wall motion abnormalities = No signs to suggest heart attack.. EF: 60-65%. Only Grade 1 Diastolic dysfunction Could explain a little bit of exertional dyspnea. Normal RV pressures - would argue against a primary Cardiac etiology for edema. Only mild MR - not noteworthy.  Leonie Man, MD    ______

## 2014-03-06 NOTE — Patient Instructions (Addendum)
Patient scheduled for port placement on 03-13-14 at [facility].

## 2014-03-06 NOTE — Progress Notes (Signed)
Patient ID: Rebecca [name], female   DOB: 27-Feb-1960, 54 y.o.   MRN: [id number]  Chief Complaint  Patient presents with  . Follow-up    breast cancer    HPI [name] is a 54 y.o. female for her follow up breast cancer. Patient states the right breast has stopped draining.   HPI  Past Medical History  Diagnosis Date  . Herpes zoster   . Multiple sclerosis 2001    Walks from room to room @ home; but Wheelchair when going out.  . Closed head injury with brief loss of consciousness   . Seizures     Takes Keppra  . IBS (irritable bowel syndrome)     no longer  . Bacterial endocarditis     History of .  Marland Kitchen Aortic valve disease     Mild AS / AI (cannot exclude Bicuspid AoV)  . Syncope and collapse   . Bilateral lower extremity edema     Chronic. LE Venous dopplers - negative for DVT.  Marland Kitchen Neuromuscular disorder     MS  . Heart murmur   . Lymphedema     has legs wrapped at [facility]  . Cervical stenosis of spine   . Cancer 12-31-13    Right breast, 12:00, 1.5 cm, T1c,N0 invasive mammary carcinoma, triple negative.    Past Surgical History  Procedure Laterality Date  . Cholecystectomy    . Ankle surgery      Left  . Port-a-cath removal      right  . Anterior cervical decomp/discectomy fusion  11/17/2011    Procedure: ANTERIOR CERVICAL DECOMPRESSION/DISCECTOMY FUSION 2 LEVELS;  Surgeon: Erline Levine, MD;  Location: [place] [facility];  Service: Neurosurgery;  Laterality: N/A;  Cervical Five-Six Six-Seven Anterior cervical decompression/diskectomy/fusion  . Transthoracic echocardiogram  March 15, 2013    Normal LV size and function with EF 60-65%.; Cannot exclude bicuspid aortic valve with mild AS and mild AI.  Marland Kitchen Lower extremity venous dopplers  Feb 27, 2013    No LE DVT  . Port a cath insertion Right 01/19/2010  . Colonoscopy  2014  . Upper gi endoscopy  2014  . Port-a-cath removal Right 09/03/2013    Procedure: REMOVAL PORT-A-CATH;  Surgeon: Conrad Redkey, MD;  Location: [place];   Service: Vascular;  Laterality: Right;  . Breast biopsy Right 12-31-13    invasive mammary  . Breast surgery Right 02/03/2014    Right simple mastectomy with sentinel node biopsy.    Family History  Problem Relation Age of Onset  . Cancer Father     skin  . Heart disease Father   . Heart attack Father   . Breast cancer Maternal Aunt 60  . Breast cancer Maternal Grandmother 74    Social History History  Substance Use Topics  . Smoking status: Never Smoker   . Smokeless tobacco: Never Used  . Alcohol Use: No    Allergies  Allergen Reactions  . Fentanyl Nausea And Vomiting  . Sulfa Antibiotics Hives and Other (See Comments)    Light headed, over heated    Current Outpatient Prescriptions  Medication Sig Dispense Refill  . acetaminophen (TYLENOL) 500 MG tablet Take 1,000 mg by mouth every 6 (six) hours as needed.    Marland Kitchen amantadine (SYMMETREL) 100 MG capsule Take 100 mg by mouth 3 (three) times daily.    . baclofen (LIORESAL) 10 MG tablet Take 10-20 mg by mouth 3 (three) times daily. 20mg  in the morning, 10mg  at noon  and 20mg  at bedtime.    . Cholecalciferol (VITAMIN D3) 2000 UNITS capsule Take 2,000 Units by mouth daily.    . Cranberry 1000 MG CAPS Take by mouth daily.    Marland Kitchen GLUCOSAMINE-CHONDROITIN PO Take 1 tablet by mouth 3 (three) times daily. STRENGTH: 1500-1200    . interferon beta-1a (REBIF REBIDOSE) 44 MCG/0.5ML injection Inject  44 mcg Sub-q 3 times weekly Rotate site after each injection 6 mL 12  . levETIRAcetam (KEPPRA) 500 MG tablet Take 1 tablet (500 mg total) by mouth 2 (two) times daily. 60 tablet 12  . Misc Natural Products (LEG VEIN & CIRCULATION) TABS Take by mouth daily.    . Multiple Vitamins-Minerals (MULTIVITAMIN PO) Take 1 tablet by mouth daily.    . naproxen (NAPROSYN) 250 MG tablet Take 500 mg by mouth as needed for mild pain.     Marland Kitchen oxybutynin (DITROPAN-XL) 10 MG 24 hr tablet TAKE 1 TABLET (10 MG TOTAL) BY MOUTH AT BEDTIME. 90 tablet 2   No current  facility-administered medications for this visit.    Review of Systems Review of Systems  Constitutional: Negative.   Respiratory: Negative.   Cardiovascular: Negative.   Drained 70 cc of dark blood.   Blood pressure 130/70, pulse 74, resp. rate 14, height 5\' 4"  (1.626 m), weight 181 lb (82.101 kg).  Physical Exam Physical Exam Examination of the mastectomy site shows a fullness suggestive of fluid accumulation.  Data Reviewed The area was prepped with ChloraPrep and 1 mL of 1% Xylocaine used. No sensitivity to the Xylocaine injection. 70 mL of old hematoma was aspirated with marked softening of the mastectomy site.  Assessment    Hematoma post drain removal.    Plan    Patient scheduled for port placement on 03-13-14 at [facility]. We will reassess the mastectomy site at the time of her port placement. If a small volume is noted re-aspiration will likely be undertaken. If a large-volume has been a cannulated a small incision at the mastectomy site and suction aspiration of the hematoma followed by replacement with a compressive wrap will likely be undertaken.  The patient has been evaluated by [name], M.D. for medical oncology and is presently scheduled to undergo Adriamycin/Cytoxan followed by Taxol adjuvant chemotherapy.       [name] 03/06/2014, 12:12 PM

## 2014-03-07 ENCOUNTER — Other Ambulatory Visit: Payer: Medicare Other

## 2014-03-10 ENCOUNTER — Ambulatory Visit: Payer: Self-pay | Admitting: Radiation Oncology

## 2014-03-10 ENCOUNTER — Ambulatory Visit: Payer: Self-pay | Admitting: Oncology

## 2014-03-12 ENCOUNTER — Encounter: Payer: Self-pay | Admitting: Cardiology

## 2014-03-12 ENCOUNTER — Ambulatory Visit (INDEPENDENT_AMBULATORY_CARE_PROVIDER_SITE_OTHER): Payer: Medicare Other | Admitting: Cardiology

## 2014-03-12 VITALS — BP 120/70 | HR 64 | Ht 64.0 in | Wt 181.0 lb

## 2014-03-12 DIAGNOSIS — R600 Localized edema: Secondary | ICD-10-CM | POA: Diagnosis not present

## 2014-03-12 DIAGNOSIS — I351 Nonrheumatic aortic (valve) insufficiency: Secondary | ICD-10-CM

## 2014-03-12 NOTE — Patient Instructions (Signed)
Your physician wants you to follow-up in: 1 year  You will receive a reminder letter in the mail two months in advance. If you don't receive a letter, please call our office to schedule the follow-up appointment.  Your physician recommends that you continue on your current medications as directed. Please refer to the Current Medication list given to you today.  

## 2014-03-13 ENCOUNTER — Encounter: Payer: Self-pay | Admitting: General Surgery

## 2014-03-13 ENCOUNTER — Ambulatory Visit: Payer: Self-pay | Admitting: General Surgery

## 2014-03-13 ENCOUNTER — Encounter (HOSPITAL_BASED_OUTPATIENT_CLINIC_OR_DEPARTMENT_OTHER): Payer: Medicare Other | Admitting: General Surgery

## 2014-03-13 ENCOUNTER — Encounter: Payer: Self-pay | Admitting: Cardiology

## 2014-03-13 DIAGNOSIS — G709 Myoneural disorder, unspecified: Secondary | ICD-10-CM | POA: Diagnosis not present

## 2014-03-13 DIAGNOSIS — R600 Localized edema: Secondary | ICD-10-CM | POA: Diagnosis not present

## 2014-03-13 DIAGNOSIS — C50911 Malignant neoplasm of unspecified site of right female breast: Secondary | ICD-10-CM | POA: Diagnosis not present

## 2014-03-13 DIAGNOSIS — C50919 Malignant neoplasm of unspecified site of unspecified female breast: Secondary | ICD-10-CM | POA: Diagnosis not present

## 2014-03-13 DIAGNOSIS — Z881 Allergy status to other antibiotic agents status: Secondary | ICD-10-CM | POA: Diagnosis not present

## 2014-03-13 DIAGNOSIS — Z452 Encounter for adjustment and management of vascular access device: Secondary | ICD-10-CM | POA: Diagnosis not present

## 2014-03-13 DIAGNOSIS — D649 Anemia, unspecified: Secondary | ICD-10-CM | POA: Diagnosis not present

## 2014-03-13 DIAGNOSIS — R011 Cardiac murmur, unspecified: Secondary | ICD-10-CM | POA: Diagnosis not present

## 2014-03-13 DIAGNOSIS — Z882 Allergy status to sulfonamides status: Secondary | ICD-10-CM | POA: Diagnosis not present

## 2014-03-13 NOTE — Assessment & Plan Note (Signed)
Not commented on in her last echo report. No suggestion of bicuspid aortic valve. There was a suggestion of mild MR. Consider possibly checking another echocardiogram in 2 years. If stable at that time I would not continue. There was question of AI in the previous echo that was performed was prior to her seeing me.

## 2014-03-13 NOTE — Progress Notes (Signed)
PCP: Bobetta Lime, MD  Clinic Note: Chief Complaint  Patient presents with  . other    9 month f/u c/o swelling legs/ankles. Meds reviewed verbally with pt.    HPI: Rebecca [name] is a 54 y.o. female with a PMH below who presents today for 9 month follow-up of chronic edema. As you recall she is a very pleasant woman with long-standing multiple sclerosis and muscle atrophy in her lower extremities. Now is suffering from chronic lower extremity edema with both lymphedema component and probably venous stasis. She is usually maintained with Unaboot wraps along with mechanical compression devices at home. She had an echocardiogram performed to assess her aortic valve because it was a question of possible bicuspid aortic valve in the past. This was clearly not the case on her echo in January of this year. The unfortunate situation since I last saw her is that she was just recently diagnosed with breast cancer after finding it painful lump. She then received to have right mastectomy with lymph node dissection. She is now getting ready to start chemotherapy..  Past Medical History  Diagnosis Date  . Herpes zoster   . Multiple sclerosis 2001    Walks from room to room @ home; but Wheelchair when going out.  . Closed head injury with brief loss of consciousness   . Seizures     Takes Keppra  . IBS (irritable bowel syndrome)     no longer  . Bacterial endocarditis     History of .  Marland Kitchen Aortic valve disease     Mild AS / AI (cannot exclude Bicuspid AoV)  . Syncope and collapse   . Bilateral lower extremity edema     Noncardiac.  Chronic. LE Venous dopplers - negative for DVT.; Echocardiogram January 2016: Normal EF with normal wall motion and valve function. Only grade 1 diastolic dysfunction. EF 60-65%. Mild MR  . Neuromuscular disorder     MS  . Heart murmur   . Lymphedema     has legs wrapped at [facility]  . Cervical stenosis of spine   . Cancer 12-31-13    Right breast, 12:00, 1.5 cm,  T1c,N0 invasive mammary carcinoma, triple negative.    Prior Cardiac Evaluation and Past Surgical History: Past Surgical History  Procedure Laterality Date  . Cholecystectomy    . Ankle surgery      Left  . Port-a-cath removal      right  . Anterior cervical decomp/discectomy fusion  11/17/2011    Procedure: ANTERIOR CERVICAL DECOMPRESSION/DISCECTOMY FUSION 2 LEVELS;  Surgeon: Erline Levine, MD;  Location: [place] [facility];  Service: Neurosurgery;  Laterality: N/A;  Cervical Five-Six Six-Seven Anterior cervical decompression/diskectomy/fusion  . Transthoracic echocardiogram  03/2013; 02/2014    a) Normal LV size and function with EF 60-65%.; Cannot exclude bicuspid aortic valve with mild AS and mild AI.; b) Normal EF with normal wall motion and valve function x Mild MR. G2 DD. EF 60-65%. Tricuspid AoV  . Lower extremity venous dopplers  Feb 27, 2013    No LE DVT  . Port a cath insertion Right 01/19/2010  . Colonoscopy  2014  . Upper gi endoscopy  2014  . Port-a-cath removal Right 09/03/2013    Procedure: REMOVAL PORT-A-CATH;  Surgeon: Conrad Mildred, MD;  Location: [place];  Service: Vascular;  Laterality: Right;  . Breast biopsy Right 12-31-13    invasive mammary  . Breast surgery Right 02/03/2014    Right simple mastectomy with sentinel node biopsy.  Marland Kitchen  Mastectomy      Interval History: She presents today really without any major cardiac complaints. Other than her edema she denies any PND or orthopnea. Her echocardiogram that was just done showed only mild MR but normal EF and normal wall motion with grade 1 diastolic dysfunction that is clearly indicative that this is not a cardiac issue. She does not get out and about much with any particular activity, but has not noted any significant exertional dyspnea or chest tightness or pressure. She denies a rapid or irregular heartbeat/palpitations, syncope/near syncope or TIA/amaurosis fugax symptoms she does have her weakness and dizziness and balance  issues related to multiple sclerosis.  No claudication.  ROS: A comprehensive was performed. Review of Systems  HENT: Negative for congestion.   Respiratory: Negative for shortness of breath.   Cardiovascular: Positive for leg swelling.  Gastrointestinal: Negative for blood in stool and melena.  Genitourinary: Negative for hematuria.  Neurological: Positive for dizziness and weakness (Generalized from MS).       Balance issues  Endo/Heme/Allergies:       Not excited about upcoming chemotherapy  Psychiatric/Behavioral: Negative for depression. The patient is not nervous/anxious (a bit anxious about chemotherapy and her diagnosis of cancer. But seems to be dealing with a very well.).   All other systems reviewed and are negative.   Current Outpatient Prescriptions on File Prior to Visit  Medication Sig Dispense Refill  . acetaminophen (TYLENOL) 500 MG tablet Take 1,000 mg by mouth every 6 (six) hours as needed.    Marland Kitchen amantadine (SYMMETREL) 100 MG capsule Take 100 mg by mouth 3 (three) times daily.    . baclofen (LIORESAL) 10 MG tablet Take 10-20 mg by mouth 3 (three) times daily. 20mg  in the morning, 10mg  at noon and 20mg  at bedtime.    . Cholecalciferol (VITAMIN D3) 2000 UNITS capsule Take 2,000 Units by mouth daily.    . Cranberry 1000 MG CAPS Take by mouth daily.    Marland Kitchen GLUCOSAMINE-CHONDROITIN PO Take 1 tablet by mouth 3 (three) times daily. STRENGTH: 1500-1200    . interferon beta-1a (REBIF REBIDOSE) 44 MCG/0.5ML injection Inject  44 mcg Sub-q 3 times weekly Rotate site after each injection 6 mL 12  . levETIRAcetam (KEPPRA) 500 MG tablet Take 1 tablet (500 mg total) by mouth 2 (two) times daily. 60 tablet 12  . Misc Natural Products (LEG VEIN & CIRCULATION) TABS Take by mouth daily.    . Multiple Vitamins-Minerals (MULTIVITAMIN PO) Take 1 tablet by mouth daily.    . naproxen (NAPROSYN) 250 MG tablet Take 500 mg by mouth as needed for mild pain.     Marland Kitchen oxybutynin (DITROPAN-XL) 10 MG 24  hr tablet TAKE 1 TABLET (10 MG TOTAL) BY MOUTH AT BEDTIME. 90 tablet 2   No current facility-administered medications on file prior to visit.   Allergies  Allergen Reactions  . Fentanyl Nausea And Vomiting  . Sulfa Antibiotics Hives and Other (See Comments)    Light headed, over heated   History   Social History  . Marital Status: Married    Spouse Name: don    Number of Children: 0  . Years of Education: 12   Occupational History  . disability    Social History Main Topics  . Smoking status: Never Smoker   . Smokeless tobacco: Never Used  . Alcohol Use: No  . Drug Use: No  . Sexual Activity:    Partners: Male   Other Topics Concern  . Not on  file   Social History Narrative   She is married. Recently moved back to [place] after being in [place] for some time. She is accompanied by her husband and aunt.   Never smoked. Never used alcohol.   family history includes Breast cancer (age of onset: 55) in her maternal aunt; Breast cancer (age of onset: 21) in her maternal grandmother; Cancer in her father; Heart attack in her father; Heart disease in her father.  Wt Readings from Last 3 Encounters:  03/12/14 181 lb (82.101 kg)  03/06/14 181 lb (82.101 kg)  02/24/14 181 lb (82.101 kg)    PHYSICAL EXAM BP 120/70 mmHg  Pulse 64  Ht 5\' 4"  (1.626 m)  Wt 181 lb (82.101 kg)  BMI 31.05 kg/m2 General appearance: A&O x 3; well rounded, well nourished. Wheelchair-bound. Her BMI is likely not correct as most likely is related to fluid retention/edema Lungs: clear to auscultation bilaterally, normal percussion bilaterally and non-labored  Heart: RRR, normal S1-S2 ; 2/6 crescendo decrescendo SEM at RUSB. Also a very soft 1/6 diastolic murmur in the same location. nondisplaced PMI. No other rubs or gallops; no JVD Abdomen: soft, non-tender; bowel sounds normal; no masses, no organomegaly; no sign of ascites  Extremities: extremities normal, atraumatic, no cyanosis, and  edema that is at least 3-4+ pitting up to both thighs. No significant varicosities noted. The legs are extremely tender to touch. No signs of cellulitis.  Neurologic: Grossly intact   Adult ECG Report  Rate: 64 ;  Rhythm: normal sinus rhythm  Narrative Interpretation: Normal EKG with normal axis, intervals and durations.  Recent Labs:  n/a    ASSESSMENT / PLAN: Overall an uneventful visit. She is doing well from a cardiac standpoint with no real active cardiac issues. I think we can probably see her back in a year, and then the pending how she is doing we could potentially see her back may be every other year. Her edema does not appear to be cardiac in nature.  Bilateral lower extremity edema Apparently this is been doing much better since she was having at home care. Unfortunately the technician who was helping her has been sick and unable to come back out. Then the swelling in her thighs is gotten much worse. Otherwise relatively stable to improved. Not using diuretics because she was not able to tolerate in the past.   Aortic insufficiency Not commented on in her last echo report. No suggestion of bicuspid aortic valve. There was a suggestion of mild MR. Consider possibly checking another echocardiogram in 2 years. If stable at that time I would not continue. There was question of AI in the previous echo that was performed was prior to her seeing me.     Orders Placed This Encounter  Procedures  . EKG 12-Lead    Order Specific Question:  Where should this test be performed    Answer:  [facility]   Meds ordered this encounter  Medications  . prochlorperazine (COMPAZINE) 10 MG tablet    Sig: Take 10 mg by mouth every 6 (six) hours as needed.      Followup: One year   Leonie Man, M.D., M.S. Interventional Cardiologist   Pager # [phone]

## 2014-03-13 NOTE — Assessment & Plan Note (Signed)
>>  ASSESSMENT AND PLAN FOR BILATERAL LOWER EXTREMITY EDEMA WRITTEN ON 03/13/2014 11:01 PM BY HARDING, DAVID W, MD  Apparently this is been doing much better since she was having at home care. Unfortunately the technician who was helping her has been sick and unable to come back out. Then the swelling in her thighs is gotten much worse. Otherwise relatively stable to improved. Not using diuretics because she was not able to tolerate in the past.

## 2014-03-13 NOTE — Assessment & Plan Note (Signed)
Apparently this is been doing much better since she was having at home care. Unfortunately the technician who was helping her has been sick and unable to come back out. Then the swelling in her thighs is gotten much worse. Otherwise relatively stable to improved. Not using diuretics because she was not able to tolerate in the past.

## 2014-03-14 ENCOUNTER — Encounter: Payer: Self-pay | Admitting: General Surgery

## 2014-03-14 ENCOUNTER — Telehealth: Payer: Self-pay

## 2014-03-14 ENCOUNTER — Ambulatory Visit (INDEPENDENT_AMBULATORY_CARE_PROVIDER_SITE_OTHER): Payer: Self-pay | Admitting: General Surgery

## 2014-03-14 VITALS — BP 110/70 | HR 68 | Resp 14 | Ht 64.0 in | Wt 181.0 lb

## 2014-03-14 DIAGNOSIS — C50411 Malignant neoplasm of upper-outer quadrant of right female breast: Secondary | ICD-10-CM | POA: Diagnosis not present

## 2014-03-14 DIAGNOSIS — Z9013 Acquired absence of bilateral breasts and nipples: Secondary | ICD-10-CM | POA: Diagnosis not present

## 2014-03-14 DIAGNOSIS — C50911 Malignant neoplasm of unspecified site of right female breast: Secondary | ICD-10-CM

## 2014-03-14 DIAGNOSIS — K589 Irritable bowel syndrome without diarrhea: Secondary | ICD-10-CM | POA: Diagnosis not present

## 2014-03-14 DIAGNOSIS — Z171 Estrogen receptor negative status [ER-]: Secondary | ICD-10-CM | POA: Diagnosis not present

## 2014-03-14 DIAGNOSIS — Z418 Encounter for other procedures for purposes other than remedying health state: Secondary | ICD-10-CM | POA: Diagnosis not present

## 2014-03-14 DIAGNOSIS — D701 Agranulocytosis secondary to cancer chemotherapy: Secondary | ICD-10-CM | POA: Diagnosis not present

## 2014-03-14 DIAGNOSIS — Z0189 Encounter for other specified special examinations: Secondary | ICD-10-CM | POA: Diagnosis not present

## 2014-03-14 DIAGNOSIS — Z79899 Other long term (current) drug therapy: Secondary | ICD-10-CM | POA: Diagnosis not present

## 2014-03-14 DIAGNOSIS — Z5111 Encounter for antineoplastic chemotherapy: Secondary | ICD-10-CM | POA: Diagnosis not present

## 2014-03-14 DIAGNOSIS — T451X5S Adverse effect of antineoplastic and immunosuppressive drugs, sequela: Secondary | ICD-10-CM | POA: Diagnosis not present

## 2014-03-14 DIAGNOSIS — G35 Multiple sclerosis: Secondary | ICD-10-CM | POA: Diagnosis not present

## 2014-03-14 NOTE — Patient Instructions (Addendum)
Patient advised to use heating pad for relief of pain. Patient can use Aleve 2 twice daily. Patient to return as scheduled.

## 2014-03-14 NOTE — Telephone Encounter (Signed)
Spoke with patient about coming in to see Dr Bary Castilla. She says that she has a [facility] scan today at [facility] at 10:15 am and that she came come see him after that.

## 2014-03-14 NOTE — Telephone Encounter (Signed)
Patient called and said that she started having pain in her mastectomy site that radiates from just under her right arm. She said that this started last night and feels like a stabbing. She has tried using Tylenol with out relief. She is unable to take Naprosyn due to just having a port placement. She states the port area is not bothering her. She is asking for a refill of her Percocet from her mastectomy surgery.

## 2014-03-14 NOTE — Progress Notes (Signed)
Patient ID: Rebecca [name], female   DOB: 09/29/1960, 54 y.o.   MRN: [id number]  Chief Complaint  Patient presents with  . Follow-up    pain in right mastectomy site    HPI Rebecca [name] is a 54 y.o. female who presents for an evaluation of pain in right mastectomy site. The procedure was performed on 02/03/14. She also had a port placed on 03/13/14. No discomfort at the port site.  HPI  Past Medical History  Diagnosis Date  . Herpes zoster   . Multiple sclerosis 2001    Walks from room to room @ home; but Wheelchair when going out.  . Closed head injury with brief loss of consciousness   . Seizures     Takes Keppra  . IBS (irritable bowel syndrome)     no longer  . Bacterial endocarditis     History of .  Marland Kitchen Aortic valve disease     Mild AS / AI (cannot exclude Bicuspid AoV)  . Syncope and collapse   . Bilateral lower extremity edema     Noncardiac.  Chronic. LE Venous dopplers - negative for DVT.; Echocardiogram January 2016: Normal EF with normal wall motion and valve function. Only grade 1 diastolic dysfunction. EF 60-65%. Mild MR  . Neuromuscular disorder     MS  . Heart murmur   . Lymphedema     has legs wrapped at [facility]  . Cervical stenosis of spine   . Cancer 12-31-13    Right breast, 12:00, 1.5 cm, T1c,N0 invasive mammary carcinoma, triple negative.    Past Surgical History  Procedure Laterality Date  . Cholecystectomy    . Ankle surgery      Left  . Port-a-cath removal      right  . Anterior cervical decomp/discectomy fusion  11/17/2011    Procedure: ANTERIOR CERVICAL DECOMPRESSION/DISCECTOMY FUSION 2 LEVELS;  Surgeon: Erline Levine, MD;  Location: [place] [facility];  Service: Neurosurgery;  Laterality: N/A;  Cervical Five-Six Six-Seven Anterior cervical decompression/diskectomy/fusion  . Transthoracic echocardiogram  03/2013; 02/2014    a) Normal LV size and function with EF 60-65%.; Cannot exclude bicuspid aortic valve with mild AS and mild AI.; b) Normal EF with  normal wall motion and valve function x Mild MR. G2 DD. EF 60-65%. Tricuspid AoV  . Lower extremity venous dopplers  Feb 27, 2013    No LE DVT  . Port a cath insertion Right 01/19/2010  . Colonoscopy  2014  . Upper gi endoscopy  2014  . Port-a-cath removal Right 09/03/2013    Procedure: REMOVAL PORT-A-CATH;  Surgeon: Conrad Vega Alta, MD;  Location: [place];  Service: Vascular;  Laterality: Right;  . Breast biopsy Right 12-31-13    invasive mammary  . Breast surgery Right 02/03/2014    Right simple mastectomy with sentinel node biopsy.  . Mastectomy      Family History  Problem Relation Age of Onset  . Cancer Father     skin  . Heart disease Father   . Heart attack Father   . Breast cancer Maternal Aunt 60  . Breast cancer Maternal Grandmother 74    Social History History  Substance Use Topics  . Smoking status: Never Smoker   . Smokeless tobacco: Never Used  . Alcohol Use: No    Allergies  Allergen Reactions  . Fentanyl Nausea And Vomiting  . Sulfa Antibiotics Hives and Other (See Comments)    Light headed, over heated    Current Outpatient Prescriptions  Medication Sig Dispense Refill  . acetaminophen (TYLENOL) 500 MG tablet Take 1,000 mg by mouth every 6 (six) hours as needed.    Marland Kitchen amantadine (SYMMETREL) 100 MG capsule Take 100 mg by mouth 3 (three) times daily.    . baclofen (LIORESAL) 10 MG tablet Take 10-20 mg by mouth 3 (three) times daily. 20mg  in the morning, 10mg  at noon and 20mg  at bedtime.    . Cholecalciferol (VITAMIN D3) 2000 UNITS capsule Take 2,000 Units by mouth daily.    . Cranberry 1000 MG CAPS Take by mouth daily.    Marland Kitchen GLUCOSAMINE-CHONDROITIN PO Take 1 tablet by mouth 3 (three) times daily. STRENGTH: 1500-1200    . interferon beta-1a (REBIF REBIDOSE) 44 MCG/0.5ML injection Inject  44 mcg Sub-q 3 times weekly Rotate site after each injection 6 mL 12  . levETIRAcetam (KEPPRA) 500 MG tablet Take 1 tablet (500 mg total) by mouth 2 (two) times daily. 60 tablet  12  . Misc Natural Products (LEG VEIN & CIRCULATION) TABS Take by mouth daily.    . Multiple Vitamins-Minerals (MULTIVITAMIN PO) Take 1 tablet by mouth daily.    . naproxen (NAPROSYN) 250 MG tablet Take 500 mg by mouth as needed for mild pain.     Marland Kitchen oxybutynin (DITROPAN-XL) 10 MG 24 hr tablet TAKE 1 TABLET (10 MG TOTAL) BY MOUTH AT BEDTIME. 90 tablet 2  . prochlorperazine (COMPAZINE) 10 MG tablet Take 10 mg by mouth every 6 (six) hours as needed.      No current facility-administered medications for this visit.    Review of Systems Review of Systems  Constitutional: Negative.   Respiratory: Negative.   Cardiovascular: Negative.     Blood pressure 110/70, pulse 68, resp. rate 14, height 5\' 4"  (1.626 m), weight 181 lb (82.101 kg).  Physical Exam Physical Exam The port site on the left chest is doing well.  Examination of the right mastectomy site shows no evidence of recurrent hematoma accumulation. There is tenderness along the lateral border of latissimus muscle. No erythema or induration.   Assessment    Likely muscle strain secondary to positioning during PowerPort placement, physical activity.     Plan    The patient may use Aleve, 2 tablets twice a day if needed. A new prescription for Norco 5/325, #30 with the inscription one-2 by mouth every 4 hours when necessary for pain was provided. No refills.she will make use of local heat, using care to avoid overheating the skin.  She'll follow up in 4 days for repeat examination of the mastectomy site prior to the initiation of chemotherapy next week.     PCP:  Crista Luria 03/14/2014, 12:57 PM

## 2014-03-18 ENCOUNTER — Encounter: Payer: Self-pay | Admitting: General Surgery

## 2014-03-18 ENCOUNTER — Ambulatory Visit (INDEPENDENT_AMBULATORY_CARE_PROVIDER_SITE_OTHER): Payer: Self-pay | Admitting: General Surgery

## 2014-03-18 VITALS — BP 130/74 | HR 74 | Resp 14 | Ht 64.0 in | Wt 181.0 lb

## 2014-03-18 DIAGNOSIS — C50911 Malignant neoplasm of unspecified site of right female breast: Secondary | ICD-10-CM

## 2014-03-18 NOTE — Patient Instructions (Signed)
Patient to return in three weeks.  

## 2014-03-18 NOTE — Progress Notes (Signed)
Patient ID: Rebecca [name], female   DOB: 03-07-1960, 54 y.o.   MRN: [id number]  Chief Complaint  Patient presents with  . Routine Post Op    port placement    HPI Rebecca [name] is a 54 y.o. female here today for her post op pot placement done on 03/13/14  . Patient starts chemo on 03/20/14. HPI  Past Medical History  Diagnosis Date  . Herpes zoster   . Multiple sclerosis 2001    Walks from room to room @ home; but Wheelchair when going out.  . Closed head injury with brief loss of consciousness   . Seizures     Takes Keppra  . IBS (irritable bowel syndrome)     no longer  . Bacterial endocarditis     History of .  Marland Kitchen Aortic valve disease     Mild AS / AI (cannot exclude Bicuspid AoV)  . Syncope and collapse   . Bilateral lower extremity edema     Noncardiac.  Chronic. LE Venous dopplers - negative for DVT.; Echocardiogram January 2016: Normal EF with normal wall motion and valve function. Only grade 1 diastolic dysfunction. EF 60-65%. Mild MR  . Neuromuscular disorder     MS  . Heart murmur   . Lymphedema     has legs wrapped at [facility]  . Cervical stenosis of spine   . Cancer 12-31-13    Right breast, 12:00, 1.5 cm, T1c,N0 invasive mammary carcinoma, triple negative.    Past Surgical History  Procedure Laterality Date  . Cholecystectomy    . Ankle surgery      Left  . Port-a-cath removal      right  . Anterior cervical decomp/discectomy fusion  11/17/2011    Procedure: ANTERIOR CERVICAL DECOMPRESSION/DISCECTOMY FUSION 2 LEVELS;  Surgeon: Erline Levine, MD;  Location: [place] [facility];  Service: Neurosurgery;  Laterality: N/A;  Cervical Five-Six Six-Seven Anterior cervical decompression/diskectomy/fusion  . Transthoracic echocardiogram  03/2013; 02/2014    a) Normal LV size and function with EF 60-65%.; Cannot exclude bicuspid aortic valve with mild AS and mild AI.; b) Normal EF with normal wall motion and valve function x Mild MR. G2 DD. EF 60-65%. Tricuspid AoV  . Lower extremity  venous dopplers  Feb 27, 2013    No LE DVT  . Port a cath insertion Right 01/19/2010  . Colonoscopy  2014  . Upper gi endoscopy  2014  . Port-a-cath removal Right 09/03/2013    Procedure: REMOVAL PORT-A-CATH;  Surgeon: Conrad Salvisa, MD;  Location: [facility];  Service: Vascular;  Laterality: Right;  . Breast biopsy Right 12-31-13    invasive mammary  . Breast surgery Right 02/03/2014    Right simple mastectomy with sentinel node biopsy.  . Mastectomy      Family History  Problem Relation Age of Onset  . Cancer Father     skin  . Heart disease Father   . Heart attack Father   . Breast cancer Maternal Aunt 60  . Breast cancer Maternal Grandmother 74    Social History History  Substance Use Topics  . Smoking status: Never Smoker   . Smokeless tobacco: Never Used  . Alcohol Use: No    Allergies  Allergen Reactions  . Fentanyl Nausea And Vomiting  . Sulfa Antibiotics Hives and Other (See Comments)    Light headed, over heated    Current Outpatient Prescriptions  Medication Sig Dispense Refill  . acetaminophen (TYLENOL) 500 MG tablet Take 1,000 mg  by mouth every 6 (six) hours as needed.    Marland Kitchen amantadine (SYMMETREL) 100 MG capsule Take 100 mg by mouth 3 (three) times daily.    . baclofen (LIORESAL) 10 MG tablet Take 10-20 mg by mouth 3 (three) times daily. 20mg  in the morning, 10mg  at noon and 20mg  at bedtime.    . Cholecalciferol (VITAMIN D3) 2000 UNITS capsule Take 2,000 Units by mouth daily.    . Cranberry 1000 MG CAPS Take by mouth daily.    Marland Kitchen GLUCOSAMINE-CHONDROITIN PO Take 1 tablet by mouth 3 (three) times daily. STRENGTH: 1500-1200    . interferon beta-1a (REBIF REBIDOSE) 44 MCG/0.5ML injection Inject  44 mcg Sub-q 3 times weekly Rotate site after each injection 6 mL 12  . levETIRAcetam (KEPPRA) 500 MG tablet Take 1 tablet (500 mg total) by mouth 2 (two) times daily. 60 tablet 12  . Misc Natural Products (LEG VEIN & CIRCULATION) TABS Take by mouth daily.    . Multiple  Vitamins-Minerals (MULTIVITAMIN PO) Take 1 tablet by mouth daily.    . naproxen (NAPROSYN) 250 MG tablet Take 500 mg by mouth as needed for mild pain.     Marland Kitchen oxybutynin (DITROPAN-XL) 10 MG 24 hr tablet TAKE 1 TABLET (10 MG TOTAL) BY MOUTH AT BEDTIME. 90 tablet 2  . prochlorperazine (COMPAZINE) 10 MG tablet Take 10 mg by mouth every 6 (six) hours as needed.      No current facility-administered medications for this visit.    Review of Systems Review of Systems  Constitutional: Negative.   Respiratory: Negative.   Cardiovascular: Negative.     Blood pressure 130/74, pulse 74, resp. rate 14, height 5\' 4"  (1.626 m), weight 181 lb (82.101 kg).  Physical Exam Physical Exam  Constitutional: She is oriented to person, place, and time. She appears well-developed and well-nourished.  Pulmonary/Chest:    Neurological: She is alert and oriented to person, place, and time.  Skin: Skin is warm and dry.    Data Reviewed Shoulder range of motion is improving.  Assessment    Resolving hematoma right mastectomy site.    Plan    Patient to return in three weeks.    PCP: Crista Luria 03/19/2014, 4:15 PM

## 2014-03-20 DIAGNOSIS — C50911 Malignant neoplasm of unspecified site of right female breast: Secondary | ICD-10-CM | POA: Diagnosis not present

## 2014-03-20 DIAGNOSIS — T451X5S Adverse effect of antineoplastic and immunosuppressive drugs, sequela: Secondary | ICD-10-CM | POA: Diagnosis not present

## 2014-03-20 DIAGNOSIS — D701 Agranulocytosis secondary to cancer chemotherapy: Secondary | ICD-10-CM | POA: Diagnosis not present

## 2014-03-20 DIAGNOSIS — Z5111 Encounter for antineoplastic chemotherapy: Secondary | ICD-10-CM | POA: Diagnosis not present

## 2014-03-20 DIAGNOSIS — Z171 Estrogen receptor negative status [ER-]: Secondary | ICD-10-CM | POA: Diagnosis not present

## 2014-03-20 DIAGNOSIS — Z418 Encounter for other procedures for purposes other than remedying health state: Secondary | ICD-10-CM | POA: Diagnosis not present

## 2014-03-21 DIAGNOSIS — Z418 Encounter for other procedures for purposes other than remedying health state: Secondary | ICD-10-CM | POA: Diagnosis not present

## 2014-03-21 DIAGNOSIS — Z171 Estrogen receptor negative status [ER-]: Secondary | ICD-10-CM | POA: Diagnosis not present

## 2014-03-21 DIAGNOSIS — Z5111 Encounter for antineoplastic chemotherapy: Secondary | ICD-10-CM | POA: Diagnosis not present

## 2014-03-21 DIAGNOSIS — C50911 Malignant neoplasm of unspecified site of right female breast: Secondary | ICD-10-CM | POA: Diagnosis not present

## 2014-03-21 DIAGNOSIS — T451X5S Adverse effect of antineoplastic and immunosuppressive drugs, sequela: Secondary | ICD-10-CM | POA: Diagnosis not present

## 2014-03-21 DIAGNOSIS — D701 Agranulocytosis secondary to cancer chemotherapy: Secondary | ICD-10-CM | POA: Diagnosis not present

## 2014-03-27 DIAGNOSIS — Z79899 Other long term (current) drug therapy: Secondary | ICD-10-CM | POA: Diagnosis not present

## 2014-03-27 DIAGNOSIS — D701 Agranulocytosis secondary to cancer chemotherapy: Secondary | ICD-10-CM | POA: Diagnosis not present

## 2014-03-27 DIAGNOSIS — G35 Multiple sclerosis: Secondary | ICD-10-CM | POA: Diagnosis not present

## 2014-03-27 DIAGNOSIS — Z171 Estrogen receptor negative status [ER-]: Secondary | ICD-10-CM | POA: Diagnosis not present

## 2014-03-27 DIAGNOSIS — Z5111 Encounter for antineoplastic chemotherapy: Secondary | ICD-10-CM | POA: Diagnosis not present

## 2014-03-27 DIAGNOSIS — Z418 Encounter for other procedures for purposes other than remedying health state: Secondary | ICD-10-CM | POA: Diagnosis not present

## 2014-03-27 DIAGNOSIS — T451X5S Adverse effect of antineoplastic and immunosuppressive drugs, sequela: Secondary | ICD-10-CM | POA: Diagnosis not present

## 2014-03-27 DIAGNOSIS — C50911 Malignant neoplasm of unspecified site of right female breast: Secondary | ICD-10-CM | POA: Diagnosis not present

## 2014-04-03 ENCOUNTER — Encounter: Payer: Self-pay | Admitting: General Surgery

## 2014-04-03 DIAGNOSIS — T451X5S Adverse effect of antineoplastic and immunosuppressive drugs, sequela: Secondary | ICD-10-CM | POA: Diagnosis not present

## 2014-04-03 DIAGNOSIS — C50911 Malignant neoplasm of unspecified site of right female breast: Secondary | ICD-10-CM | POA: Diagnosis not present

## 2014-04-03 DIAGNOSIS — Z418 Encounter for other procedures for purposes other than remedying health state: Secondary | ICD-10-CM | POA: Diagnosis not present

## 2014-04-03 DIAGNOSIS — D701 Agranulocytosis secondary to cancer chemotherapy: Secondary | ICD-10-CM | POA: Diagnosis not present

## 2014-04-04 ENCOUNTER — Telehealth: Payer: Self-pay | Admitting: Diagnostic Neuroimaging

## 2014-04-04 ENCOUNTER — Telehealth: Payer: Self-pay | Admitting: *Deleted

## 2014-04-04 NOTE — Telephone Encounter (Signed)
If she is having a relapse, we can consider steroids (oral or IV). Rebif won't help this flare. She may need MRI brain w/wo depending on symptoms. She may need labs to rule out infections. Can you run this by Dr. Felecia Shelling on Monday? I am available by cell phone if needed to discuss.  Thanks,  VRP

## 2014-04-04 NOTE — Telephone Encounter (Signed)
Called and spoke with the pt. She explained to me about the chemo and MS medications. I asked her to find out the name of the chemo medication that she was currently so I can try to find out if it is compatible. Pt states that she is experiencing symptoms that her MS is relapsing. She wants to know if she can take her rebif while on this chemo. I informed her that Dr. Leta Baptist was out of the office until 04/10/14 at 1 pm and that I would get him to call her Thursday afternoon to check in on her and see how she is doing with the chemo. She also said she would call me back and let me know the name and strength of the chemo.

## 2014-04-04 NOTE — Telephone Encounter (Signed)
Patient is calling because she had chemo yesterday and the oncologist wants to know if the interferon beta-1a (REBIF REBIDOSE) 44 MCG/0.5ML injection  can be used with the chemo patient is taking. Patient is not sure of the name of chemo but it is a high dose. Please call and advise. Thank you.

## 2014-04-06 ENCOUNTER — Inpatient Hospital Stay: Payer: Self-pay | Admitting: Internal Medicine

## 2014-04-06 DIAGNOSIS — Z9221 Personal history of antineoplastic chemotherapy: Secondary | ICD-10-CM | POA: Diagnosis not present

## 2014-04-06 DIAGNOSIS — R609 Edema, unspecified: Secondary | ICD-10-CM | POA: Diagnosis not present

## 2014-04-06 DIAGNOSIS — T458X5A Adverse effect of other primarily systemic and hematological agents, initial encounter: Secondary | ICD-10-CM | POA: Diagnosis present

## 2014-04-06 DIAGNOSIS — R32 Unspecified urinary incontinence: Secondary | ICD-10-CM | POA: Diagnosis present

## 2014-04-06 DIAGNOSIS — D72829 Elevated white blood cell count, unspecified: Secondary | ICD-10-CM | POA: Diagnosis not present

## 2014-04-06 DIAGNOSIS — Z9049 Acquired absence of other specified parts of digestive tract: Secondary | ICD-10-CM | POA: Diagnosis present

## 2014-04-06 DIAGNOSIS — G35 Multiple sclerosis: Secondary | ICD-10-CM | POA: Diagnosis not present

## 2014-04-06 DIAGNOSIS — D696 Thrombocytopenia, unspecified: Secondary | ICD-10-CM | POA: Diagnosis not present

## 2014-04-06 DIAGNOSIS — Z823 Family history of stroke: Secondary | ICD-10-CM | POA: Diagnosis not present

## 2014-04-06 DIAGNOSIS — Z79899 Other long term (current) drug therapy: Secondary | ICD-10-CM | POA: Diagnosis not present

## 2014-04-06 DIAGNOSIS — M79604 Pain in right leg: Secondary | ICD-10-CM | POA: Diagnosis not present

## 2014-04-06 DIAGNOSIS — C50911 Malignant neoplasm of unspecified site of right female breast: Secondary | ICD-10-CM | POA: Diagnosis not present

## 2014-04-06 DIAGNOSIS — Z803 Family history of malignant neoplasm of breast: Secondary | ICD-10-CM | POA: Diagnosis not present

## 2014-04-06 DIAGNOSIS — A419 Sepsis, unspecified organism: Secondary | ICD-10-CM | POA: Diagnosis not present

## 2014-04-06 DIAGNOSIS — I890 Lymphedema, not elsewhere classified: Secondary | ICD-10-CM | POA: Diagnosis present

## 2014-04-06 DIAGNOSIS — T451X5A Adverse effect of antineoplastic and immunosuppressive drugs, initial encounter: Secondary | ICD-10-CM | POA: Diagnosis present

## 2014-04-06 DIAGNOSIS — M791 Myalgia: Secondary | ICD-10-CM | POA: Diagnosis not present

## 2014-04-06 DIAGNOSIS — Z885 Allergy status to narcotic agent status: Secondary | ICD-10-CM | POA: Diagnosis not present

## 2014-04-06 DIAGNOSIS — Z882 Allergy status to sulfonamides status: Secondary | ICD-10-CM | POA: Diagnosis not present

## 2014-04-06 DIAGNOSIS — E669 Obesity, unspecified: Secondary | ICD-10-CM | POA: Diagnosis present

## 2014-04-06 DIAGNOSIS — D6481 Anemia due to antineoplastic chemotherapy: Secondary | ICD-10-CM | POA: Diagnosis not present

## 2014-04-06 DIAGNOSIS — R6510 Systemic inflammatory response syndrome (SIRS) of non-infectious origin without acute organ dysfunction: Secondary | ICD-10-CM | POA: Diagnosis not present

## 2014-04-06 DIAGNOSIS — Z452 Encounter for adjustment and management of vascular access device: Secondary | ICD-10-CM | POA: Diagnosis not present

## 2014-04-06 DIAGNOSIS — Z9011 Acquired absence of right breast and nipple: Secondary | ICD-10-CM | POA: Diagnosis present

## 2014-04-06 DIAGNOSIS — D649 Anemia, unspecified: Secondary | ICD-10-CM | POA: Diagnosis not present

## 2014-04-06 DIAGNOSIS — M898X5 Other specified disorders of bone, thigh: Secondary | ICD-10-CM | POA: Diagnosis present

## 2014-04-06 DIAGNOSIS — D6959 Other secondary thrombocytopenia: Secondary | ICD-10-CM | POA: Diagnosis not present

## 2014-04-06 DIAGNOSIS — Z8249 Family history of ischemic heart disease and other diseases of the circulatory system: Secondary | ICD-10-CM | POA: Diagnosis not present

## 2014-04-06 DIAGNOSIS — M79651 Pain in right thigh: Secondary | ICD-10-CM | POA: Diagnosis not present

## 2014-04-06 DIAGNOSIS — G40909 Epilepsy, unspecified, not intractable, without status epilepticus: Secondary | ICD-10-CM | POA: Diagnosis present

## 2014-04-06 DIAGNOSIS — Z833 Family history of diabetes mellitus: Secondary | ICD-10-CM | POA: Diagnosis not present

## 2014-04-06 DIAGNOSIS — M25552 Pain in left hip: Secondary | ICD-10-CM | POA: Diagnosis not present

## 2014-04-06 DIAGNOSIS — M79605 Pain in left leg: Secondary | ICD-10-CM | POA: Diagnosis not present

## 2014-04-07 ENCOUNTER — Telehealth: Payer: Self-pay | Admitting: *Deleted

## 2014-04-07 ENCOUNTER — Other Ambulatory Visit: Payer: Self-pay | Admitting: *Deleted

## 2014-04-07 NOTE — Telephone Encounter (Signed)
Spoke with the husband on the phone. Pt was admitted to [facility] on Sunday after the pain in her legs became unbearable. Husband states that her chemo doc Dr. Maryjane Hurter is involved. I asked him if they had done another MRI to check for new lesions and he said no. The last MRI was in July 2015 and he said he would ask and see if they could do one. I spoke with Dr. Felecia Shelling as well who had given me some outpt orders but unsure how long the pt will be in the hospital at this point. Asked the husband to call with any further concerns.

## 2014-04-07 NOTE — Telephone Encounter (Signed)
Pts husband called and canceled Rebecca [name] appointment that was scheduled on April 10, 2014 due to being in the hospital. Her Husband wanted to let you know if you wanted to drop by, she is in room 120 and expected to be there several more days.

## 2014-04-07 NOTE — Telephone Encounter (Deleted)
Spoke with the pt at length regarding her issues. She explained that her legs were hurting and her feet were going numb and she was concerned that she was starting to feel like she was relapsing. She wanted to know if she could take her rebif while doing chemo. I told her to find out for me the name of the chemo that she is on so I could ask Dr. Leta Baptist. I also explained that he was out of the office until 04/10/14 but I would try and get back to her ASAP. She said that she was concerned that she had been off of her rebif for too long. Pt was very thankful for the call and said she would get back to me about the name of the chemo that she is currently on. Message sent to Dr. Leta Baptist as well

## 2014-04-08 ENCOUNTER — Ambulatory Visit
Admit: 2014-04-08 | Disposition: A | Discharge status: Home / Self Care | Payer: Self-pay | Attending: Oncology | Admitting: Oncology

## 2014-04-09 ENCOUNTER — Ambulatory Visit: Payer: Medicare Other | Admitting: General Surgery

## 2014-04-10 ENCOUNTER — Ambulatory Visit
Admit: 2014-04-10 | Disposition: A | Discharge status: Home / Self Care | Payer: Self-pay | Attending: Oncology | Admitting: Oncology

## 2014-04-10 DIAGNOSIS — Z5111 Encounter for antineoplastic chemotherapy: Secondary | ICD-10-CM | POA: Diagnosis not present

## 2014-04-10 DIAGNOSIS — C50911 Malignant neoplasm of unspecified site of right female breast: Secondary | ICD-10-CM | POA: Diagnosis not present

## 2014-04-10 DIAGNOSIS — T451X5S Adverse effect of antineoplastic and immunosuppressive drugs, sequela: Secondary | ICD-10-CM | POA: Diagnosis not present

## 2014-04-10 DIAGNOSIS — Z79899 Other long term (current) drug therapy: Secondary | ICD-10-CM | POA: Diagnosis not present

## 2014-04-10 DIAGNOSIS — Z418 Encounter for other procedures for purposes other than remedying health state: Secondary | ICD-10-CM | POA: Diagnosis not present

## 2014-04-10 DIAGNOSIS — Z171 Estrogen receptor negative status [ER-]: Secondary | ICD-10-CM | POA: Diagnosis not present

## 2014-04-10 DIAGNOSIS — G35 Multiple sclerosis: Secondary | ICD-10-CM | POA: Diagnosis not present

## 2014-04-10 DIAGNOSIS — K589 Irritable bowel syndrome without diarrhea: Secondary | ICD-10-CM | POA: Diagnosis not present

## 2014-04-10 DIAGNOSIS — Z9013 Acquired absence of bilateral breasts and nipples: Secondary | ICD-10-CM | POA: Diagnosis not present

## 2014-04-10 DIAGNOSIS — D701 Agranulocytosis secondary to cancer chemotherapy: Secondary | ICD-10-CM | POA: Diagnosis not present

## 2014-04-14 ENCOUNTER — Telehealth: Payer: Self-pay | Admitting: Diagnostic Neuroimaging

## 2014-04-14 ENCOUNTER — Telehealth: Payer: Self-pay | Admitting: *Deleted

## 2014-04-14 NOTE — Telephone Encounter (Signed)
Dr. Leta Baptist asked me to call the pt and inform her that she should not be on rebif while on chemo and if she starts to have another exacerbation, we could get her into the office for IV soulmedrol or send her in a prednisone taper pack. She stated that she is feeling much better since getting out of the hospital and that she will call if she needs anything further. She stated a thank you and said she would call if she needed anything else

## 2014-04-14 NOTE — Telephone Encounter (Signed)
Patient's Oncologist questioning if Rx interferon beta-1a (REBIF REBIDOSE) 44 MCG/0.5ML injection will interfere with Chemo Therapy medication, DOXORUBICIN 110 mg per treatment and CYTOXAN 1000-150 mg per treatment.  Patient was admitted to hospital on 2/28 - 3/2 and given Morphine due to extreme pain in legs and hard time walking.  Please call and advise.

## 2014-04-17 ENCOUNTER — Telehealth: Payer: Self-pay | Admitting: Diagnostic Neuroimaging

## 2014-04-17 DIAGNOSIS — D701 Agranulocytosis secondary to cancer chemotherapy: Secondary | ICD-10-CM | POA: Diagnosis not present

## 2014-04-17 DIAGNOSIS — Z79899 Other long term (current) drug therapy: Secondary | ICD-10-CM | POA: Diagnosis not present

## 2014-04-17 DIAGNOSIS — C50911 Malignant neoplasm of unspecified site of right female breast: Secondary | ICD-10-CM | POA: Diagnosis not present

## 2014-04-17 DIAGNOSIS — T451X5S Adverse effect of antineoplastic and immunosuppressive drugs, sequela: Secondary | ICD-10-CM | POA: Diagnosis not present

## 2014-04-17 DIAGNOSIS — Z5111 Encounter for antineoplastic chemotherapy: Secondary | ICD-10-CM | POA: Diagnosis not present

## 2014-04-17 DIAGNOSIS — Z418 Encounter for other procedures for purposes other than remedying health state: Secondary | ICD-10-CM | POA: Diagnosis not present

## 2014-04-17 DIAGNOSIS — Z171 Estrogen receptor negative status [ER-]: Secondary | ICD-10-CM | POA: Diagnosis not present

## 2014-04-17 DIAGNOSIS — G35 Multiple sclerosis: Secondary | ICD-10-CM | POA: Diagnosis not present

## 2014-04-17 NOTE — Telephone Encounter (Signed)
Jan with [facility] @ [phone], ext [phone], wanted to inform Dr.Penumalli patient has had a Breast Mastectomy due to Triple Negative Breast Cancer...FYI

## 2014-04-17 NOTE — Telephone Encounter (Signed)
Noted. -VRP 

## 2014-04-24 DIAGNOSIS — Z171 Estrogen receptor negative status [ER-]: Secondary | ICD-10-CM | POA: Diagnosis not present

## 2014-04-24 DIAGNOSIS — Z5111 Encounter for antineoplastic chemotherapy: Secondary | ICD-10-CM | POA: Diagnosis not present

## 2014-04-24 DIAGNOSIS — C50911 Malignant neoplasm of unspecified site of right female breast: Secondary | ICD-10-CM | POA: Diagnosis not present

## 2014-04-24 DIAGNOSIS — D701 Agranulocytosis secondary to cancer chemotherapy: Secondary | ICD-10-CM | POA: Diagnosis not present

## 2014-04-24 DIAGNOSIS — T451X5S Adverse effect of antineoplastic and immunosuppressive drugs, sequela: Secondary | ICD-10-CM | POA: Diagnosis not present

## 2014-04-24 DIAGNOSIS — Z418 Encounter for other procedures for purposes other than remedying health state: Secondary | ICD-10-CM | POA: Diagnosis not present

## 2014-04-30 ENCOUNTER — Encounter
Admit: 2014-04-30 | Disposition: A | Discharge status: Home / Self Care | Payer: Self-pay | Attending: Family Medicine | Admitting: Family Medicine

## 2014-04-30 DIAGNOSIS — I890 Lymphedema, not elsewhere classified: Secondary | ICD-10-CM | POA: Diagnosis not present

## 2014-04-30 DIAGNOSIS — M6281 Muscle weakness (generalized): Secondary | ICD-10-CM | POA: Diagnosis not present

## 2014-04-30 DIAGNOSIS — R262 Difficulty in walking, not elsewhere classified: Secondary | ICD-10-CM | POA: Diagnosis not present

## 2014-04-30 DIAGNOSIS — G35 Multiple sclerosis: Secondary | ICD-10-CM | POA: Diagnosis not present

## 2014-05-01 DIAGNOSIS — Z418 Encounter for other procedures for purposes other than remedying health state: Secondary | ICD-10-CM | POA: Diagnosis not present

## 2014-05-01 DIAGNOSIS — Z5111 Encounter for antineoplastic chemotherapy: Secondary | ICD-10-CM | POA: Diagnosis not present

## 2014-05-01 DIAGNOSIS — D701 Agranulocytosis secondary to cancer chemotherapy: Secondary | ICD-10-CM | POA: Diagnosis not present

## 2014-05-01 DIAGNOSIS — T451X5S Adverse effect of antineoplastic and immunosuppressive drugs, sequela: Secondary | ICD-10-CM | POA: Diagnosis not present

## 2014-05-01 DIAGNOSIS — C50911 Malignant neoplasm of unspecified site of right female breast: Secondary | ICD-10-CM | POA: Diagnosis not present

## 2014-05-01 DIAGNOSIS — Z171 Estrogen receptor negative status [ER-]: Secondary | ICD-10-CM | POA: Diagnosis not present

## 2014-05-01 LAB — COMPREHENSIVE METABOLIC PANEL
ALT: 15 U/L
AST: 20 U/L
Albumin: 4 g/dL
Alkaline Phosphatase: 80 U/L
Anion Gap: 7 (ref 7–16)
BUN: 11 mg/dL
Bilirubin,Total: 0.4 mg/dL
CHLORIDE: 102 mmol/L
Calcium, Total: 9.1 mg/dL
Co2: 28 mmol/L
Creatinine: 0.52 mg/dL
Glucose: 118 mg/dL — ABNORMAL HIGH
POTASSIUM: 3.7 mmol/L
Sodium: 137 mmol/L
Total Protein: 6.8 g/dL

## 2014-05-01 LAB — CBC CANCER CENTER
BASOS PCT: 0.4 %
Basophil #: 0 x10 3/mm (ref 0.0–0.1)
EOS PCT: 0.6 %
Eosinophil #: 0 x10 3/mm (ref 0.0–0.7)
HCT: 29.6 % — ABNORMAL LOW (ref 35.0–47.0)
HGB: 9.8 g/dL — ABNORMAL LOW (ref 12.0–16.0)
Lymphocyte #: 0.6 x10 3/mm — ABNORMAL LOW (ref 1.0–3.6)
Lymphocyte %: 9.6 %
MCH: 30.5 pg (ref 26.0–34.0)
MCHC: 33.3 g/dL (ref 32.0–36.0)
MCV: 92 fL (ref 80–100)
MONO ABS: 0.8 x10 3/mm (ref 0.2–0.9)
Monocyte %: 12.2 %
Neutrophil #: 5.1 x10 3/mm (ref 1.4–6.5)
Neutrophil %: 77.2 %
PLATELETS: 151 x10 3/mm (ref 150–440)
RBC: 3.23 10*6/uL — AB (ref 3.80–5.20)
RDW: 15.8 % — ABNORMAL HIGH (ref 11.5–14.5)
WBC: 6.6 x10 3/mm (ref 3.6–11.0)

## 2014-05-01 LAB — CREATININE, SERUM: Creatine, Serum: 0.52

## 2014-05-06 ENCOUNTER — Other Ambulatory Visit: Payer: Self-pay | Admitting: Diagnostic Neuroimaging

## 2014-05-09 ENCOUNTER — Telehealth: Payer: Self-pay | Admitting: Diagnostic Neuroimaging

## 2014-05-09 ENCOUNTER — Ambulatory Visit
Admit: 2014-05-09 | Disposition: A | Discharge status: Home / Self Care | Payer: Self-pay | Attending: Oncology | Admitting: Oncology

## 2014-05-09 ENCOUNTER — Encounter
Admit: 2014-05-09 | Disposition: A | Discharge status: Home / Self Care | Payer: Self-pay | Attending: Family Medicine | Admitting: Family Medicine

## 2014-05-09 DIAGNOSIS — Z171 Estrogen receptor negative status [ER-]: Secondary | ICD-10-CM | POA: Diagnosis not present

## 2014-05-09 DIAGNOSIS — K589 Irritable bowel syndrome without diarrhea: Secondary | ICD-10-CM | POA: Diagnosis not present

## 2014-05-09 DIAGNOSIS — G629 Polyneuropathy, unspecified: Secondary | ICD-10-CM | POA: Diagnosis not present

## 2014-05-09 DIAGNOSIS — G35 Multiple sclerosis: Secondary | ICD-10-CM | POA: Diagnosis not present

## 2014-05-09 DIAGNOSIS — C50911 Malignant neoplasm of unspecified site of right female breast: Secondary | ICD-10-CM | POA: Diagnosis not present

## 2014-05-09 DIAGNOSIS — I890 Lymphedema, not elsewhere classified: Secondary | ICD-10-CM | POA: Diagnosis not present

## 2014-05-09 DIAGNOSIS — Z79899 Other long term (current) drug therapy: Secondary | ICD-10-CM | POA: Diagnosis not present

## 2014-05-09 NOTE — Telephone Encounter (Signed)
Patient questioning if she could resume Rx AMPRYA.  Patient aware Dr. Leta Baptist is out of office until Monday.  Please call and advise.

## 2014-05-13 ENCOUNTER — Telehealth: Payer: Self-pay | Admitting: *Deleted

## 2014-05-13 NOTE — Telephone Encounter (Signed)
Spoke to the pt on the phone after I talked with Dr. Leta Baptist and he asked me to tell the pt that he did not want her to restart the amprya since she had a possible seizure in 2012 and he said that he could discuss it further with her in an office visit. I asked her if she would like to schedule an appt today and she said that she would hold off until after her next round of chemo. She said she would call back to schedule an appt.

## 2014-05-13 NOTE — Telephone Encounter (Signed)
Error

## 2014-05-14 ENCOUNTER — Telehealth: Payer: Self-pay | Admitting: *Deleted

## 2014-05-14 NOTE — Telephone Encounter (Signed)
Patient called back with name of Chemo Medication, DOXORUBJCIN 110 mg per treatment and CYTOXAN 1000-008 mg per treatment.  Will begin new treatment on 05/15/14, 1 treatment once a week for 12 weeks.

## 2014-05-14 NOTE — Telephone Encounter (Signed)
Called and spoke with the pt, asked her when the last time she took her rebif was and informed her it was so I could fill out some paperwork. She told me last rebif dose was 02/03/15 the day before her 1st chemo dose

## 2014-05-15 DIAGNOSIS — Z79899 Other long term (current) drug therapy: Secondary | ICD-10-CM | POA: Diagnosis not present

## 2014-05-15 DIAGNOSIS — I890 Lymphedema, not elsewhere classified: Secondary | ICD-10-CM | POA: Diagnosis not present

## 2014-05-15 DIAGNOSIS — Z171 Estrogen receptor negative status [ER-]: Secondary | ICD-10-CM | POA: Diagnosis not present

## 2014-05-15 DIAGNOSIS — G35 Multiple sclerosis: Secondary | ICD-10-CM | POA: Diagnosis not present

## 2014-05-15 DIAGNOSIS — C50911 Malignant neoplasm of unspecified site of right female breast: Secondary | ICD-10-CM | POA: Diagnosis not present

## 2014-05-15 DIAGNOSIS — G629 Polyneuropathy, unspecified: Secondary | ICD-10-CM | POA: Diagnosis not present

## 2014-05-15 LAB — CBC CANCER CENTER
BASOS ABS: 0 x10 3/mm (ref 0.0–0.1)
BASOS PCT: 0.6 %
Eosinophil #: 0 x10 3/mm (ref 0.0–0.7)
Eosinophil %: 0.8 %
HCT: 29.7 % — AB (ref 35.0–47.0)
HGB: 9.8 g/dL — AB (ref 12.0–16.0)
LYMPHS ABS: 0.5 x10 3/mm — AB (ref 1.0–3.6)
LYMPHS PCT: 7.9 %
MCH: 30.6 pg (ref 26.0–34.0)
MCHC: 33.1 g/dL (ref 32.0–36.0)
MCV: 92 fL (ref 80–100)
MONO ABS: 0.9 x10 3/mm (ref 0.2–0.9)
Monocyte %: 14.9 %
NEUTROS ABS: 4.5 x10 3/mm (ref 1.4–6.5)
Neutrophil %: 75.8 %
Platelet: 171 x10 3/mm (ref 150–440)
RBC: 3.22 10*6/uL — ABNORMAL LOW (ref 3.80–5.20)
RDW: 18 % — ABNORMAL HIGH (ref 11.5–14.5)
WBC: 5.9 x10 3/mm (ref 3.6–11.0)

## 2014-05-15 LAB — COMPREHENSIVE METABOLIC PANEL
ALBUMIN: 4.1 g/dL
AST: 21 U/L
Alkaline Phosphatase: 84 U/L
Anion Gap: 5 — ABNORMAL LOW (ref 7–16)
BILIRUBIN TOTAL: 0.3 mg/dL
BUN: 12 mg/dL
CALCIUM: 9.1 mg/dL
CHLORIDE: 102 mmol/L
CO2: 29 mmol/L
Creatinine: 0.46 mg/dL
EGFR (African American): >60
EGFR (Non-African Amer.): >60
GLUCOSE: 116 mg/dL — AB
Potassium: 3.8 mmol/L
SGPT (ALT): 17 U/L
SODIUM: 136 mmol/L
Total Protein: 7.1 g/dL

## 2014-05-22 ENCOUNTER — Other Ambulatory Visit: Payer: Self-pay | Admitting: Diagnostic Neuroimaging

## 2014-05-22 DIAGNOSIS — Z79899 Other long term (current) drug therapy: Secondary | ICD-10-CM | POA: Diagnosis not present

## 2014-05-22 DIAGNOSIS — C50911 Malignant neoplasm of unspecified site of right female breast: Secondary | ICD-10-CM | POA: Diagnosis not present

## 2014-05-22 DIAGNOSIS — Z171 Estrogen receptor negative status [ER-]: Secondary | ICD-10-CM | POA: Diagnosis not present

## 2014-05-22 DIAGNOSIS — I890 Lymphedema, not elsewhere classified: Secondary | ICD-10-CM | POA: Diagnosis not present

## 2014-05-22 DIAGNOSIS — G35 Multiple sclerosis: Secondary | ICD-10-CM | POA: Diagnosis not present

## 2014-05-22 DIAGNOSIS — G629 Polyneuropathy, unspecified: Secondary | ICD-10-CM | POA: Diagnosis not present

## 2014-05-22 LAB — CBC CANCER CENTER
BASOS ABS: 0 x10 3/mm (ref 0.0–0.1)
Basophil %: 1 %
EOS ABS: 0 x10 3/mm (ref 0.0–0.7)
Eosinophil %: 0.6 %
HCT: 29.8 % — AB (ref 35.0–47.0)
HGB: 9.8 g/dL — ABNORMAL LOW (ref 12.0–16.0)
Lymphocyte #: 0.3 x10 3/mm — ABNORMAL LOW (ref 1.0–3.6)
Lymphocyte %: 7.6 %
MCH: 30.3 pg (ref 26.0–34.0)
MCHC: 32.8 g/dL (ref 32.0–36.0)
MCV: 92 fL (ref 80–100)
MONOS PCT: 7.9 %
Monocyte #: 0.3 x10 3/mm (ref 0.2–0.9)
NEUTROS ABS: 3.6 x10 3/mm (ref 1.4–6.5)
Neutrophil %: 82.9 %
PLATELETS: 214 x10 3/mm (ref 150–440)
RBC: 3.23 10*6/uL — ABNORMAL LOW (ref 3.80–5.20)
RDW: 17.6 % — AB (ref 11.5–14.5)
WBC: 4.4 x10 3/mm (ref 3.6–11.0)

## 2014-05-24 ENCOUNTER — Other Ambulatory Visit: Payer: Self-pay | Admitting: Oncology

## 2014-05-29 DIAGNOSIS — G35 Multiple sclerosis: Secondary | ICD-10-CM | POA: Diagnosis not present

## 2014-05-29 DIAGNOSIS — I890 Lymphedema, not elsewhere classified: Secondary | ICD-10-CM | POA: Diagnosis not present

## 2014-05-29 DIAGNOSIS — Z171 Estrogen receptor negative status [ER-]: Secondary | ICD-10-CM | POA: Diagnosis not present

## 2014-05-29 DIAGNOSIS — G629 Polyneuropathy, unspecified: Secondary | ICD-10-CM | POA: Diagnosis not present

## 2014-05-29 DIAGNOSIS — Z79899 Other long term (current) drug therapy: Secondary | ICD-10-CM | POA: Diagnosis not present

## 2014-05-29 DIAGNOSIS — C50911 Malignant neoplasm of unspecified site of right female breast: Secondary | ICD-10-CM | POA: Diagnosis not present

## 2014-05-29 LAB — COMPREHENSIVE METABOLIC PANEL
ALK PHOS: 58 U/L
ANION GAP: 5 — AB (ref 7–16)
Albumin: 4.1 g/dL
BUN: 15 mg/dL
Bilirubin,Total: 0.6 mg/dL
CALCIUM: 9.4 mg/dL
CHLORIDE: 105 mmol/L
CO2: 29 mmol/L
Creatinine: 0.42 mg/dL — ABNORMAL LOW
EGFR (Non-African Amer.): >60
Glucose: 115 mg/dL — ABNORMAL HIGH
Potassium: 3.7 mmol/L
SGOT(AST): 24 U/L
SGPT (ALT): 24 U/L
Sodium: 139 mmol/L
Total Protein: 7 g/dL

## 2014-05-29 LAB — CBC CANCER CENTER
BASOS PCT: 1 %
Basophil #: 0 x10 3/mm (ref 0.0–0.1)
Eosinophil #: 0.1 x10 3/mm (ref 0.0–0.7)
Eosinophil %: 3.1 %
HCT: 27.5 % — AB (ref 35.0–47.0)
HGB: 9.2 g/dL — AB (ref 12.0–16.0)
LYMPHS ABS: 0.3 x10 3/mm — AB (ref 1.0–3.6)
Lymphocyte %: 16.7 %
MCH: 30.7 pg (ref 26.0–34.0)
MCHC: 33.2 g/dL (ref 32.0–36.0)
MCV: 92 fL (ref 80–100)
Monocyte #: 0.3 x10 3/mm (ref 0.2–0.9)
Monocyte %: 13.8 %
NEUTROS ABS: 1.3 x10 3/mm — AB (ref 1.4–6.5)
Neutrophil %: 65.4 %
Platelet: 197 x10 3/mm (ref 150–440)
RBC: 2.98 10*6/uL — ABNORMAL LOW (ref 3.80–5.20)
RDW: 18.6 % — ABNORMAL HIGH (ref 11.5–14.5)
WBC: 2 x10 3/mm — CL (ref 3.6–11.0)

## 2014-05-31 NOTE — Op Note (Signed)
PATIENT NAME:  Rebecca [name], Rebecca [name] MR#:  [id number] DATE OF BIRTH:  Jun 27, 1960  DATE OF PROCEDURE:  02/03/2014  PREOPERATIVE DIAGNOSIS: Right breast cancer.   POSTOPERATIVE DIAGNOSIS: Right breast cancer.   OPERATIVE PROCEDURE: Right simple mastectomy and sentinel node biopsy.   SURGEON: Hervey Ard, MD.   ANESTHESIA: General by LMA under Dr.Van Staveran. ESTIMATED BLOOD LOSS: Less than 25 mL.   FLUID REPLACEMENT: Crystalloid.   CLINICAL NOTE: This 54 year old woman was recently identified with an abnormal mammogram and underwent biopsy showing evidence of invasive mammary carcinoma. Given her options for management, she has elected to proceed to mastectomy.   OPERATIVE NOTE: With the patient under adequate general anesthesia and having previously been injected with technetium sulfur colloid in the department of radiology, the breast was prepped with ChloraPrep and draped. Prior to prepping, 4 mL of saline/methylene blue diluted 2:1 was injected in the subareolar plexus. A standard elliptical incision was outlined and incised sharply for the upper flap. This was then elevated, making use of electrocautery. The axillary envelope was entered and a single hot blue lymph node with an attached old, non-blue lymph node were identified. Touch preps were reported negative for macro metastatic disease by Delorse Lek, MD, from pathology.   The lower flap was created and the margins of dissection were the area just below the clavicle superiorly, sternum medially, rectus fascia inferiorly and the serratus fascia laterally. The breast was elevated off the underlying muscle, taking the fascia of that muscle with it. Specimen was orientated and sent fresh to pathology. The wound was irrigated with sterile water. A Blake drain was placed and brought out through a stab wound incision in the inferior medial flap and anchored in place with 3-0 nylon. The skin was then approximated with interrupted 2-0 Vicryl in the  deep dermal layer in multiple segments. Benzoin and Steri-Strips followed by Telfa, fluffed gauze, Kerlix and an Ace wrap were then applied.   The patient tolerated the procedure well and was taken to the recovery room in stable condition.    ____________________________ [name], MD jwb:TT D: 02/03/2014 20:07:09 ET T: 02/03/2014 21:52:34 ET JOB#: [id number]  cc: [name], MD, <Dictator> Bobetta Lime, MD Tahirah Sara Amedeo Kinsman MD ELECTRONICALLY SIGNED 02/04/2014 9:23

## 2014-06-02 DIAGNOSIS — M6281 Muscle weakness (generalized): Secondary | ICD-10-CM | POA: Diagnosis not present

## 2014-06-02 DIAGNOSIS — G35 Multiple sclerosis: Secondary | ICD-10-CM | POA: Diagnosis not present

## 2014-06-02 DIAGNOSIS — I890 Lymphedema, not elsewhere classified: Secondary | ICD-10-CM | POA: Diagnosis not present

## 2014-06-02 DIAGNOSIS — M79661 Pain in right lower leg: Secondary | ICD-10-CM | POA: Diagnosis not present

## 2014-06-02 DIAGNOSIS — R262 Difficulty in walking, not elsewhere classified: Secondary | ICD-10-CM | POA: Diagnosis not present

## 2014-06-02 DIAGNOSIS — M79662 Pain in left lower leg: Secondary | ICD-10-CM | POA: Diagnosis not present

## 2014-06-02 LAB — SURGICAL PATHOLOGY

## 2014-06-05 DIAGNOSIS — Z171 Estrogen receptor negative status [ER-]: Secondary | ICD-10-CM | POA: Diagnosis not present

## 2014-06-05 DIAGNOSIS — G629 Polyneuropathy, unspecified: Secondary | ICD-10-CM | POA: Diagnosis not present

## 2014-06-05 DIAGNOSIS — I890 Lymphedema, not elsewhere classified: Secondary | ICD-10-CM | POA: Diagnosis not present

## 2014-06-05 DIAGNOSIS — Z79899 Other long term (current) drug therapy: Secondary | ICD-10-CM | POA: Diagnosis not present

## 2014-06-05 DIAGNOSIS — C50911 Malignant neoplasm of unspecified site of right female breast: Secondary | ICD-10-CM | POA: Diagnosis not present

## 2014-06-05 DIAGNOSIS — G35 Multiple sclerosis: Secondary | ICD-10-CM | POA: Diagnosis not present

## 2014-06-05 LAB — BASIC METABOLIC PANEL
Anion Gap: 4 — ABNORMAL LOW (ref 7–16)
BUN: 12 mg/dL
CALCIUM: 9.4 mg/dL
CO2: 28 mmol/L
CREATININE: 0.56 mg/dL
Chloride: 107 mmol/L
EGFR (African American): >60
GLUCOSE: 130 mg/dL — AB
Potassium: 3.8 mmol/L
Sodium: 139 mmol/L

## 2014-06-05 LAB — CBC CANCER CENTER
Basophil #: 0 x10 3/mm (ref 0.0–0.1)
Basophil %: 1.1 %
EOS ABS: 0.1 x10 3/mm (ref 0.0–0.7)
Eosinophil %: 4.2 %
HCT: 28.1 % — ABNORMAL LOW (ref 35.0–47.0)
HGB: 9.4 g/dL — ABNORMAL LOW (ref 12.0–16.0)
LYMPHS PCT: 15.3 %
Lymphocyte #: 0.3 x10 3/mm — ABNORMAL LOW (ref 1.0–3.6)
MCH: 30.9 pg (ref 26.0–34.0)
MCHC: 33.4 g/dL (ref 32.0–36.0)
MCV: 93 fL (ref 80–100)
MONO ABS: 0.3 x10 3/mm (ref 0.2–0.9)
Monocyte %: 11.9 %
Neutrophil #: 1.5 x10 3/mm (ref 1.4–6.5)
Neutrophil %: 67.5 %
PLATELETS: 166 x10 3/mm (ref 150–440)
RBC: 3.03 10*6/uL — ABNORMAL LOW (ref 3.80–5.20)
RDW: 18.7 % — ABNORMAL HIGH (ref 11.5–14.5)
WBC: 2.3 x10 3/mm — ABNORMAL LOW (ref 3.6–11.0)

## 2014-06-06 ENCOUNTER — Other Ambulatory Visit: Payer: Self-pay | Admitting: Oncology

## 2014-06-08 NOTE — Consult Note (Signed)
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 HPI:   Patient procedure most recent cycle of Adriamycin and Cytoxan on Thursday, April 03, 2014. She received Neulasta on February 26. Patient admitted to the hospital with significant leukocytosis and bilateral leg pain concerning for sepsis. Currently, her pain is well-controlled with current narcotic regimen. She feels weak and fatigued, but otherwise well. She denies any fevers. She has no new neurologic complaints. She has a good appetite and denies weight loss. She denies any chest pain or shortness of breath. She denies any nausea, vomiting, constipation, or diarrhea. She has no urinary complaints. Patient offers no further specific complaints today.   Review of Systems:  Performance Status (ECOG): 2  Pain ?: No complaints (0, none)  Emotional well-being: None  Review of Systems:   As per HPI. Otherwise, 10 point system review was negative.   Allergies:  Sulfa drugs: Hives  Fentanyl: N/V/Diarrhea  PFSH: Additional Past Medical and Surgical History: multiple sclerosis, seizures, IBS, mild aortic stenosis, cervical stenosis. Cholecystectomy, ankle surgery,  Surgery.    Family history:  Maternal aunt and maternal grandmother with breast cancer. Father with CAD, skin cancer.    Social history: Patient denies tobacco and alcohol.   Home Medications: Medication Instructions Last Modified Date/Time  Norco 325 mg-5 mg oral tablet 1 tab(s) orally every 6 hours, As Needed 28-Feb-16 11:29  prochlorperazine 10 mg oral tablet 1 tab(s) orally every 6 hours-pt to start once chemo begins 28-Feb-16 11:29  Lutein 10 mg oral capsule 1 cap(s) orally once a day 28-Feb-16 11:29  Vitamin B-100 Vitamin B Complex with C oral tablet, extended release 1 tab(s) orally once a day 28-Feb-16 11:29  ranitidine 150 mg oral  tablet 1 tab(s) orally 2 times a day 28-Feb-16 11:29  baclofen 10 mg oral tablet 2 tab(s) orally once a day (in the morning), 1 tablet once a day at lunch, and 2 tablets at bedtime 28-Feb-16 11:29  levETIRAcetam 500 mg oral tablet 1 tab(s) orally 2 times a day 28-Feb-16 11:29  multivitamin 1 tab(s) orally once a day 28-Feb-16 11:29  oxybutynin extended release 10 mg/24 hr oral tablet, extended release 1 tab(s) orally once a day (in the evening) 28-Feb-16 11:29  leg vein and circulation tabs 1 cap(s) orally 2 times a day 28-Feb-16 11:29  Vitamin D3 2000 intl units oral capsule 1 cap(s) orally 2 times a week, Tuesdays and Thursdays 28-Feb-16 11:29  amantadine 100 mg oral capsule 1 cap(s) orally 3 times a day 28-Feb-16 11:29   Vital Signs:  :: vital signs stable, patient afebrile.   Physical Exam:  General: no acute distress.  Mental Status: normal affect  Eyes: anicteric sclera  Respiratory: clear to auscultation bilaterally  Cardiovascular: regular rate and rhythm, no murmur, rub, or gallop  Breast: exam deferred today.  Gastrointestinal: soft, nondistended, nontender, no organomegaly.  normal active bowel sounds  Musculoskeletal: No edema  Skin: No rash or petechiae noted  Neurological: alert, answering all questions appropriately.  Cranial nerves grossly intact   Laboratory Results: Routine Chem:  29-Feb-16 05:17   Glucose, Serum 95  BUN 10  Creatinine (comp)  0.48  Sodium, Serum 140  Potassium, Serum 3.7  Chloride, Serum 105  CO2, Serum 29  Calcium (Total), Serum  8.2  Anion Gap  6  Osmolality (calc) 278  eGFR (African American) >60  eGFR (Non-African American) >60 (eGFR values <  17mL/min/1.73 m2 may be an indication of chronic kidney disease (CKD). Calculated eGFR, using the MRDR Study equation, is useful in  patients with stable renal function. The eGFR calculation will not be reliable in acutely ill patients when serum creatinine is changing rapidly. It is not useful  in patients on dialysis. The eGFR calculation may not be applicable to patients at the low and high extremes of body sizes, pregnant women, and vegetarians.)  Magnesium, Serum 2.0 (1.8-2.4 THERAPEUTIC RANGE: 4-7 mg/dL TOXIC: > 10 mg/dL  -----------------------)  Routine Hem:  29-Feb-16 05:17   WBC (CBC)  38.3  RBC (CBC)  3.14  Hemoglobin (CBC)  9.3  Hematocrit (CBC)  29.2  Platelet Count (CBC)  98  MCV 93  MCH 29.5  MCHC  31.8  RDW 13.7  Neutrophil % 98.1  Lymphocyte % 0.9  Monocyte % 0.7  Eosinophil % 0.1  Basophil % 0.2  Neutrophil #  37.5  Lymphocyte #  0.4  Monocyte # 0.3  Eosinophil # 0.0  Basophil # 0.1 (Result(s) reported on 07 Apr 2014 at 09:20AM.)   Assessment and Plan: Impression:   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 Plan:   1. Breast cancer: Patient received chemotherapy with Adriamycin and Cytoxan on April 03, 2014 followed with Neulasta the next day. She has been instructed to keep her previously scheduled follow-up appointment for laboratory work only on March 3 and then consideration for cycle 3 on March 10.Leukocytosis: No obvious evidence of infection. Appreciate infectious disease input. Although unusual for a white count to get this high on Neulasta, it is possible. White count is now trending down. Continue to monitor.Leg pain: Although she did not have leg pain with her first injection of Neulasta, this is a common side effect of this treatment. Continue current narcotics as prescribed.Multiple sclerosis: Patient's treatments are temporarily on hold during chemotherapy.  primary neurologist is Dr. Andrey Spearman.  phone [phone], fax [phone]  consult, will follow.    CC Referral:  cc: Dr. Andrey Spearman.  phone [phone], fax [phone]   Tumor Staging:  Tumor Staging Tumor Staging   Tumor Staging Source Pathological   Tumor T1c   Node N0    Metastasis M0   Stage I A   ER Status negative   PR Status negative   HER-2 Status negative   Cancer Status No evidence of disease clinically   Stage/Additional Pathology grade 3, tumor size 1.3 cm   Advance Directive:  Advance Directive ([place]) no   Advance Directive Information Given patient refused   Electronic Signatures: [name] (MD)  (Signed 29-Feb-16 17:46)  Authored: Note Type, CC/HPI, Review of Systems, ALLERGIES, Patient Family Social History, HOME MEDICATIONS, Vital Signs, Physical Exam, Lab Results Review, Assessment and Plan, CC Referring Physician, Quality Measures, Advance Directive   Last Updated: 29-Feb-16 17:46 by [name] (MD)

## 2014-06-08 NOTE — Discharge Summary (Signed)
PATIENT NAME:  Rebecca [name], Rebecca [name] MR#:  [id number] DATE OF BIRTH:  07-Apr-1960  DATE OF ADMISSION:  04/06/2014 DATE OF DISCHARGE:  04/09/2014  ADMISSION COMPLAINT: Bilateral thigh pain for 3 days.   DISCHARGE DIAGNOSES:  1. Bilateral thigh pain, reaction to Neulasta.   2. Leukocytosis as a reaction to Neulasta.  3. Breast cancer status post mastectomy on the right.  4. Multiple sclerosis.  5. Anemia and thrombocytopenia due to chemotherapy.   CONSULTATIONS:  1. Dr. Adrian Prows, infectious disease.  2. Dr. [name], oncology.  3. Dr. Hervey Ard, surgery.   PROCEDURES: None.   HISTORY OF PRESENT ILLNESS: This 54 year old woman with a history of right-sided breast cancer status post mastectomy, undergoing chemotherapy, presented to the Emergency Room with bilateral thigh pain for 3 days. She had her last chemotherapy administration 2 weeks ago and developed neutropenia and 3 days prior to admission had received her second dose of Neulasta. She began to have bilateral thigh pain soon after receiving the Neulasta. The pain is 10 out of 10, she cannot move her lower extremities. She did not have any fevers or chills. She does have a history of chronic bilateral lymphedema and lower extremity pain for several years. On presentation to the Emergency Room her white blood cell count is 53.5 thousand and she is admitted for concern for sepsis hospital.   COURSE BY PROBLEM:  1.  Bilateral thigh pain: CK was normal. There were no abnormalities on physical examination. No swelling, no rash. It is thought that her bilateral thigh pain is bone pain as a reaction to Neulasta with a very exaggerated response. She was treated initially with opiate medications which helped to control pain, but it seemed that transitioning over to nonsteroidal anti-inflammatories and antihistamines helped even more. On discharge she has no further thigh pain. She will follow up with her oncologist to discuss future  administrations of Neulasta.   2.  Leukocytosis: Initially there was concern for sepsis given her very high white blood cell count, but she had just received Neulasta 3 days prior to admission and this high count is attributed to this Neulasta. Her white count decreased significantly throughout the hospitalization and actually on the time of discharge is down to 3.5. She will be following up the day after discharge with her primary oncologist for further laboratory work.  3.  History of right-sided breast cancer status post mastectomy: Surgical service saw the patient while in hospital and she is doing well from her surgery, there are no further surgical needs. She continues to follow with oncology regarding breast cancer.  4.  Bilateral lower extremity lymphedema: This has been an ongoing problem for many years. She manages it with Unna boots, leg elevation, and there has been no significant change to the degree of swelling.  5.  Multiple sclerosis:  The patient takes amantadine only for multiple sclerosis. Symptoms were not thought to be related to multiple sclerosis, however it is not clear what role this underlying condition has an complicating her treatment for breast cancer.   DISCHARGE PHYSICAL EXAMINATION:  VITAL SIGNS: Temperature 98.4, pulse 99, respirations 18, blood pressure 122/72, oxygenation 91% on room air.  GENERAL: No acute distress. The patient up and moving around the room.  CARDIOVASCULAR: Regular rate and rhythm. No murmurs, rubs, or gallops.  RESPIRATORY: Lungs clear to auscultation bilaterally with good air movement.  LOWER EXTREMITIES: She is walking comfortably. Strength is 5 out of 5 throughout. There are no joint effusions. Range of  motion is normal. She does have bilateral pitting edema 2 + to the knees which is unchanged.  PSYCHIATRIC: The patient alert and oriented. Good insight into her clinical condition.   LABORATORY DATA: Sodium 141, potassium 3.5, chloride 106,  bicarbonate 29, BUN 8, creatinine 0.45, glucose 82. White blood cells 3.5, hemoglobin 8.6, platelets 86,000. MCV is 93.   DISCHARGE MEDICATIONS:  1. Amantadine 100 mg 1 capsule 3 times a day.  2. Baclofen 10 mg 2 tablets once a day in the morning, 1 tablet at lunch, and 2 tablets at bedtime.  3. Levetiracetam 500 mg 1 tablet twice a day.  4. Multivitamin 1 tablet daily.  5. Oxybutynin extended release 10 mg 24 hour tablet 1 tablet once a day.  6. Leg, Vein, and Circulation tablets, 1 capsule twice a day.   7. Vitamin D3, 2000 international units 1 capsule twice a week on Tuesdays and Thursdays. 8. Prochlorperazine 10 mg 1 tablet every 6 hours once chemotherapy begins.  9. Lutein 10 mg 1 capsule once a day.  10. Vitamin B complex with C 1 tablet once a day.  11. Ranitidine 150 mg 1 tablet twice a day.  12. Ibuprofen 600 mg 1 tablet every 6 hours.  13. Loratadine 10 mg 1 tablet once a day.  14. Acetaminophen oxycodone 325-5 mg, 1 tablet every 6 hours as needed for pain.     CONDITION ON DISCHARGE: Stable.   DISPOSITION: Discharged to home. No further home health needs.   DISCHARGE INSTRUCTIONS:  1.  Diet: Low-sodium, low-fat, low-cholesterol diet.  2.  Activity limitations: None.  3.  Timeframe for followup: Please follow up tomorrow at Dr. Gary Fleet office for further laboratory work.    TIME SPENT ON DISCHARGE: 45 minutes.     ____________________________ Earleen Newport. Volanda Napoleon, MD cpw:bu D: 04/14/2014 08:53:45 ET T: 04/14/2014 13:27:37 ET JOB#: [id number]  cc: Barnetta Chapel P. Volanda Napoleon, MD, <Dictator> Kathlene November. Grayland Ormond, MD Bobetta Lime, MD Aldean Jewett MD ELECTRONICALLY SIGNED 04/19/2014 10:51

## 2014-06-08 NOTE — H&P (Signed)
PATIENT NAME:  Rebecca [name], NESSEL MR#:  [id number] DATE OF BIRTH:  1960/05/16  DATE OF ADMISSION:  04/06/2014  PRIMARY CARE PHYSICIAN: Bobetta Lime, MD   REFERRING PHYSICIAN: Latina Craver, MD  CHIEF COMPLAINT: Bilateral thigh pain for 3 days.   HISTORY OF PRESENT ILLNESS: A 54 year old Caucasian female with a history of right breast cancer, status post mastectomy and chemotherapy, who presented the ED with the above chief complaint. The patient is alert, awake, oriented, in no acute distress. The patient said she has a history of breast cancer and is status post right mastectomy. The patient got chemotherapy 2 weeks ago and developed neutropenia, for which she got Neulasta 3 days ago. The patient started to have bilateral thigh pain 3 days ago, which is on and off, 10/10 at maximum, especially on the left side. The patient cannot move her lower extremities. The patient denies any fever or chills. The patient has chronic bilateral lymphedema and chronic bilateral lower extremity pain. She said this time it is different. The patient was found to have leukocytosis with WBC at 53.5. It is treated with antibiotics in the ED. The patient denies any other symptoms.   PAST MEDICAL HISTORY:  1.  Right-sided breast cancer, status post mastectomy and chemotherapy.  2.  Multiple sclerosis.  3.  Endocarditis.  4.  Aortic valve disease.  5.  Seizure disorder.   6.  Lymphedema.   PAST SURGICAL HISTORY: Right mastectomy, cholecystectomy, and neck surgery.   SOCIAL HISTORY: No smoking or drinking or illicit drugs.   FAMILY HISTORY: Stroke and diabetes in her family.   ALLERGIES: FENTANYL, SULFA DRUGS.  HOME MEDICATIONS:   1.  Vitamin D3 at 2000 international units 1 capsule twice a day.  2.  Vitamin B complex with C oral tablets 1 tablet once a day.  3.  Ranitidine 150 mg p.o. b.i.d.  4.  Prochlorperazine 10 mg p.o. every 6 hours to start once chemotherapy begins.  5.  Oxybutynin extended release  10 mg per 24 hours oral tablets once a day in the evening.  6.  Norco 325/5 mg p.o. every 6 hours p.r.n.  7.  Multivitamin 1 tablet p.o. daily.  8.  Lutein 10 mg capsule 1 capsule once a day.  9.  Keppra 500 mg p.o. b.i.d.  10.  Leg and circulation capsule 1 capsule twice a day. 11.  Baclofen 10 mg p.o. tablet 2 tablets once a day in the morning and 1 tablet once a day at lunch time and 2 tablets at bedtime.  12.  Amantadine 100 mg p.o. t.i.d.   REVIEW OF SYSTEMS:  CONSTITUTIONAL: The patient denies any fever or chills. No headache or dizziness, but has generalized weakness.  EYES: No double vision or blurred vision.  EARS, NOSE, THROAT: No postnasal drip, slurred speech, or dysphagia.  CARDIOVASCULAR: No chest pain, palpitation, orthopnea, or nocturnal dyspnea, but has chronic bilateral lower extremity lymphedema.  PULMONARY: No cough, sputum, shortness of breath, or hemoptysis.   GASTROINTESTINAL: No abdominal pain, but has nausea and vomiting. No diarrhea. No melena or bloody stool.  GENITOURINARY: No dysuria or hematuria, but has chronic incontinence.  SKIN: No rash or jaundice.  NEUROLOGY: No syncope, loss of consciousness, or seizure.  ENDOCRINOLOGY: No polyuria, polydipsia, heat or cold intolerance.  HEMATOLOGY: No easy bleeding or bleeding.   PHYSICAL EXAMINATION:  VITAL SIGNS: Temperature 98.1; blood pressure 137/77; pulse was 120, now decreased to 110; O2 saturation 96% on room air.  GENERAL: The patient  is alert, awake, oriented, in no acute distress.  HEENT: Pupils round, equal, reactive to light and accommodation. Moist oral mucosa. Clear oropharynx.  NECK: Supple. No JVD or carotid bruit. No lymphadenopathy. No thyromegaly.  CARDIOVASCULAR: S1 and S2. Regular rate and rhythm. Systolic murmur 3/6.  PULMONARY: Bilateral air entry. No wheezing or rales. No use of accessory muscles to breathe.  ABDOMEN: Soft. No distention or tenderness. Obese. Difficult to estimate whether the  patient has organomegaly. Bowel sounds are present.  EXTREMITIES: Bilateral lower extremity lymphedema with dressing. Unable to check the patient's pedal pulses. Tenderness on bilateral thigh, but no erythema, edema, or warmness. The patient cannot lift legs due to bilateral thigh tenderness.  NEUROLOGIC: A and O x 3. No focal intracranial deficit. The patient is unable to move lower extremities due to pain and chronic lymphedema.   LABORATORY DATA: Chest x-ray: No active disease. Urinalysis is negative. WBC 53.5, hemoglobin 10.5, platelets 121,000. Glucose 113, BUN 15, creatinine 0.43. Electrolytes normal.  CK 53. The patient's WBC 4 days ago was 7.6 and 10 days ago it was 1.1.   IMPRESSIONS:  1.  Sepsis, unclear etiology. The patient has tachycardia and leukocytosis.  2.  Bilateral thigh tenderness.  3.  Leukocytosis.  4.  Anemia.  5.  Thrombocytopenia.  6.  Right-sided breast cancer, status post surgery and chemotherapy.  7.  History of endocarditis.  8.  Multiple sclerosis.  9.  Obesity.  PLAN OF TREATMENT:  1.  The patient will be admitted to medical floor and we will start vancomycin and Zosyn. Follow up blood culture and get an oncology and infectious disease  consult. Follow up CBC.  2.  The patient also has a seizure disorder. We will continue Keppra.  3.  Pain control and Zofran p.r.n.  4.  I discussed the patient's condition and the plan of treatment with the patient, patient's  husband, and aunt.   TIME SPENT: About 58 minutes.    ____________________________ Demetrios Loll, MD qc:ts D: 04/06/2014 15:59:00 ET T: 04/06/2014 17:20:34 ET JOB#: [id number]  cc: Demetrios Loll, MD, <Dictator> Demetrios Loll MD ELECTRONICALLY SIGNED 04/07/2014 17:35

## 2014-06-08 NOTE — Op Note (Signed)
PATIENT NAME:  Rebecca [name], Rebecca [name] MR#:  [id number] DATE OF BIRTH:  [birth year]  DATE OF PROCEDURE:  03/13/2014  PREOPERATIVE DIAGNOSIS: Right breast cancer, need for central venous access.   POSTOPERATIVE DIAGNOSIS:  Right breast cancer, need for central venous access.   OPERATIVE PROCEDURE: Left subclavian PowerPort placement with ultrasound and fluoroscopic guidance.   SURGEON: [name], MD   ANESTHESIA:  Attended local under Dr. Myra Gianotti, 10 mL of 1% plain Xylocaine.   ESTIMATED BLOOD LOSS: Minimal.   CLINICAL NOTE: This 54 year old woman has triple negative breast cancer and is felt to be a candidate for adjuvant chemotherapy. Central venous access was requested by her treating oncologist.   OPERATIVE NOTE: With the patient under adequate sedation, the left chest and neck were prepped with ChloraPrep and draped. In Trendelenburg position, ultrasound was used to confirm patency of the left subclavian vein. This was cannulated under ultrasound guidance. Guidewire was passed, followed by the dilator. The catheter was positioned at the junction of the right atrium and SVC. It was tunneled to a pocket on the left anterior chest. The port was anchored to the deep tissue with interrupted 3-0 Prolene sutures. The port easily irrigated and aspirated with the patient in the supine position. The wound was closed in layers with 3-0 Vicryl to the adipose tissue and a running 4-0 Vicryl subcuticular suture for the skin. Benzoin, Steri-Strips, Telfa and Tegaderm dressings were applied.   Erect portable chest x-ray in the recovery room showed no evidence of pneumothorax. Catheter tip as described above.    ____________________________ [name], MD jwb:mw D: 03/13/2014 08:35:42 ET T: 03/13/2014 11:15:56 ET JOB#: [id number]  cc: [name], MD, <Dictator> [place] MD ELECTRONICALLY SIGNED 03/13/2014 15:34

## 2014-06-08 NOTE — Consult Note (Signed)
PATIENT NAME:  Rebecca [name], Rebecca [name] MR#:  [id number] DATE OF BIRTH:  [birth year]  DATE OF CONSULTATION:  04/07/2014  REFERRING PHYSICIAN:  Dr. Bridgett Larsson CONSULTING PHYSICIAN:  Cheral Marker. Ola Spurr, MD  REASON FOR CONSULTATION: Leukocytosis and sepsis.   HISTORY OF PRESENT ILLNESS: This is a pleasant 54 year old female with history of breast cancer as well as multiple sclerosis. She underwent mastectomy and has been on chemotherapy under the care of Dr. Grayland Ormond. She was admitted February 28th with the onset of severe bilateral thigh pain as well marked leukocytosis and tachycardia. Her last chemotherapy was approximately 2 weeks ago. She was given Neulasta recently for neutropenia. The pain came on over several days, but was worse on the 27th which brought her to the Emergency Room. She is also having some increased weakness in her legs. She does have a history of multiple sclerosis but is off medications for this. She follows with neurology in [place].   PAST MEDICAL HISTORY:  1.  Right-sided breast cancer, status post mastectomy and chemotherapy.  2.  Multiple sclerosis, followed at [facility] neurology.  3.  Endocarditis.  4.  Aortic valve disease.  5.  Seizure disorder.  6.  Chronic lymphedema, followed at the lymphedema clinic.   PAST SURGICAL HISTORY: Right mastectomy, cholecystectomy, neck surgery.  SOCIAL HISTORY: No smoking tobacco or drugs.   FAMILY HISTORY: Noncontributory.   ALLERGIES: FENTANYL AND SULFA DRUGS.  ANTIBIOTICS SINCE ADMISSION: Include Zosyn and vancomycin.   REVIEW OF SYSTEMS: Eleven systems reviewed and negative, except as per HPI.  PHYSICAL EXAMINATION: VITAL SIGNS: Temperature 98.2, pulse 101, blood pressure 115/77, respirations 18, sat 93% on room air. She has been afebrile since admission she was also afebrile in the ED.  GENERAL: She is chronically ill-appearing, lying in bed in no acute distress.  HEENT: Pupils reactive. Sclerae anicteric. Oropharynx clear with no  thrush. NECK: Supple.  HEART: Tachy but regular.  LUNGS: Clear.  ABDOMEN: Soft, nontender, nondistended. No hepatosplenomegaly. BREASTS: She has a well-healed breast incision on her right breast with no drainage, redness, tenderness or fluctuance.  EXTREMITIES: Chronic bilateral lower extremity nonpitting edema from lymphedema. Her thighs are somewhat tender to palpation.  SKIN: No rash.  NEUROLOGIC: She is alert and oriented x3. Grossly nonfocal neuro exam.   DIAGNOSTIC DATA: Her white blood count on February 28th was 53,000. Of note, on February 18th it was 1.1 with neutrophil count at that time of 0.6. Hemoglobin on the 28th was 10.5 and platelets 121,000. Blood cultures x2 are negative. Urinalysis negative. Urine culture negative. CK was 52,000. Renal function is normal with creatinine of 0.48. LFTs normal except slight elevation of alk phos at 131.   Chest x-ray is negative.   IMPRESSION: A 54 year old female with history of breast cancer, undergoing chemotherapy, with recent neutropenia and recent infusion of Neulasta, as well as a history of multiple sclerosis, off medications, admitted with severe thigh bone pain and weakness. She is also tachycardic and had a white count of 50,000, now down to 30,000. Of note, she has no other systemic symptoms of infection with no signs of central nervous system, sinus, pulmonary, gastrointestinal or genitourinary infection. She also has no signs of a skin infection.   RECOMMENDATIONS: 1.  Stop antibiotics and observe.  2.  I think her bone pain is likely from the Neulasta and the risk response she had with marked leukocytosis. Would recommend using NSAIDs for treating this. 3.  Observe for fever, development of new or concerning infectious symptoms.  Thank you for the consult. I will be glad to follow with you.   ____________________________ Cheral Marker. Ola Spurr, MD dpf:sb D: 04/07/2014 13:45:49 ET T: 04/07/2014 15:00:24  ET JOB#: [id number]  cc: Cheral Marker. Ola Spurr, MD, <Dictator> DAVID Ola Spurr MD ELECTRONICALLY SIGNED 04/10/2014 19:54

## 2014-06-09 ENCOUNTER — Ambulatory Visit: Payer: Medicare Other | Attending: Family Medicine | Admitting: Occupational Therapy

## 2014-06-09 ENCOUNTER — Encounter: Payer: Self-pay | Admitting: Occupational Therapy

## 2014-06-09 DIAGNOSIS — R262 Difficulty in walking, not elsewhere classified: Secondary | ICD-10-CM | POA: Diagnosis not present

## 2014-06-09 DIAGNOSIS — M79662 Pain in left lower leg: Secondary | ICD-10-CM | POA: Insufficient documentation

## 2014-06-09 DIAGNOSIS — M79661 Pain in right lower leg: Secondary | ICD-10-CM | POA: Insufficient documentation

## 2014-06-09 DIAGNOSIS — M6281 Muscle weakness (generalized): Secondary | ICD-10-CM | POA: Diagnosis not present

## 2014-06-09 DIAGNOSIS — I890 Lymphedema, not elsewhere classified: Secondary | ICD-10-CM | POA: Diagnosis not present

## 2014-06-09 NOTE — Patient Instructions (Signed)
Educated patient and husband on the need for compression wrapping on the right leg, 3 times a week and daily if possible for the left leg to help with open area healing.  They both demonstrate understanding.

## 2014-06-09 NOTE — Therapy (Signed)
[place] MAIN [facility] SERVICES [address] [place], [place], [address] Phone: [phone]   Fax:  [phone]  Occupational Therapy Treatment  Patient Details  Name: Rebecca [name] MRN: [id number] Date of Birth: 1960-08-28 Referring Provider:  Bobetta Lime, MD  Encounter Date: 06/09/2014      OT End of Session - 06/09/14 1458    Visit Number 3   Number of Visits 36   Date for OT Re-Evaluation 07/23/14   Authorization Type Medicare G code visit 3   Authorization Time Period of 10   OT Start Time 0900   OT Stop Time 1000   OT Time Calculation (min) 60 min   Activity Tolerance Patient tolerated treatment well;Patient limited by fatigue;Treatment limited secondary to medical complications (Comment)   Behavior During Therapy [facility] for tasks assessed/performed      Past Medical History  Diagnosis Date  . Herpes zoster   . Multiple sclerosis 2001    Walks from room to room @ home; but Wheelchair when going out.  . Closed head injury with brief loss of consciousness   . Seizures     Takes Keppra  . IBS (irritable bowel syndrome)     no longer  . Bacterial endocarditis     History of .  Marland Kitchen Aortic valve disease     Mild AS / AI (cannot exclude Bicuspid AoV)  . Syncope and collapse   . Bilateral lower extremity edema     Noncardiac.  Chronic. LE Venous dopplers - negative for DVT.; Echocardiogram January 2016: Normal EF with normal wall motion and valve function. Only grade 1 diastolic dysfunction. EF 60-65%. Mild MR  . Neuromuscular disorder     MS  . Heart murmur   . Lymphedema     has legs wrapped at [facility]  . Cervical stenosis of spine   . Cancer 12-31-13    Right breast, 12:00, 1.5 cm, T1c,N0 invasive mammary carcinoma, triple negative.  . Breast cancer   . MS (multiple sclerosis)   . Irritable bowel     Past Surgical History  Procedure Laterality Date  . Cholecystectomy    . Ankle surgery      Left  . Port-a-cath removal       right  . Anterior cervical decomp/discectomy fusion  11/17/2011    Procedure: ANTERIOR CERVICAL DECOMPRESSION/DISCECTOMY FUSION 2 LEVELS;  Surgeon: Erline Levine, MD;  Location: [place] NEURO ORS;  Service: Neurosurgery;  Laterality: N/A;  Cervical Five-Six Six-Seven Anterior cervical decompression/diskectomy/fusion  . Transthoracic echocardiogram  03/2013; 02/2014    a) Normal LV size and function with EF 60-65%.; Cannot exclude bicuspid aortic valve with mild AS and mild AI.; b) Normal EF with normal wall motion and valve function x Mild MR. G2 DD. EF 60-65%. Tricuspid AoV  . Lower extremity venous dopplers  Feb 27, 2013    No LE DVT  . Port a cath insertion Right 01/19/2010  . Colonoscopy  2014  . Upper gi endoscopy  2014  . Port-a-cath removal Right 09/03/2013    Procedure: REMOVAL PORT-A-CATH;  Surgeon: Conrad Balfour, MD;  Location: [place];  Service: Vascular;  Laterality: Right;  . Breast biopsy Right 12-31-13    invasive mammary  . Breast surgery Right 02/03/2014    Right simple mastectomy with sentinel node biopsy.  . Mastectomy    . Ankle surgery      There were no vitals filed for this visit.  Visit Diagnosis:  Lymphedema  Subjective Assessment - 06/09/14 1006    Subjective  Patient reports she feels the small open area on her left leg is much better after wrapping multiple times this week.    Patient is accompained by: Family member   Pertinent History Husband reports patient has been feeling weaker with her chemo treatments and not being able to take her usual medications for her MS.  He reports when walking her to the bathroom this week she could not make it and he had to lower her to the floor.  He called EMS who came and assessed her and got her up from the floor and she denies any pain or issues.     Limitations Patient is currently receiving chemo and is extremely limited in her transfers and functional mobility due to overall weakness.    Patient Stated Goals Patient  wants to get her legs back down to a smaller size, be able to transfer and walk better as well as be able to help more with her self care tasks, especially toileting.   Currently in Pain? No/denies             LYMPHEDEMA/ONCOLOGY QUESTIONNAIRE - 06/09/14 1516    Right Lower Extremity Lymphedema   At Midpatella/Popliteal Crease 49.2 cm   30 cm Proximal to Floor at Lateral Plantar Foot 55.8 cm   20 cm Proximal to Floor at Lateral Plantar Foot 47.8 1   10  cm Proximal to Floor at Lateral Malleoli 31.1 cm   5 cm Proximal to 1st MTP Joint 23.3 cm   Across MTP Joint 25.8 cm   Around Proximal Great Toe 9.8 cm   Other 35.7  circumference of ankle/heel   Left Lower Extremity Lymphedema   At Midpatella/Popliteal Crease 49.5 cm   30 cm Proximal to Floor at Lateral Plantar Foot 57.8 cm   20 cm Proximal to Floor at Lateral Plantar Foot 49.3 cm   10 cm Proximal to Floor at Lateral Malleoli 31.8 cm   5 cm Proximal to 1st MTP Joint 24.6 cm   Across MTP Joint 24 cm   Around Proximal Great Toe 9.5 cm   Other 34.1  circumference of ankle/heel                 OT Treatments/Exercises (OP) - 06/09/14 0001    Manual Therapy   Edema Management Patient arrived with bandages intact on bilateral lower extremities from foot to knee. Therapist removed bandages and performed skin inspection. Patient did not have any open areas noted on her right lower extremity, however she still has one open area on the left leg, medial aspect of the leg about 1/2 way up her lower leg,reduced to  2 cm wide by 1 cm height.  Circumferential measurements taken and recorded on flowsheet.  Patient seen for Skin care of bilateral lower extremities with application of abdominal pad to left lower extremity to medial aspect of lower leg for area where a blister was previously formed and is still open with some minimal clear fluid drainage. Stockinette applied from bilateral foot to knee. Artiflex 10cm to foot and ankle.  Artiflex 12 cm to ankle, calf and to the knee. Short stretch bandages applied as follows: one 6 cm bandage applied to foot and up to ankle. One 8 cm bandage applied from foot to mid leg. One 10 cm bandage from ankle to calf. All bandages applied with 50% stretch in a circular wrap pattern. Topper added to keep tape and bandages in place.  OT Education - 06/09/14 1456    Education provided Yes   Education Details Husband educated on compression wrapping techniques to produce appropriate pressure gradient for optimal decongestion of the lower extremities.    Person(s) Educated Patient;Spouse   Methods Explanation   Comprehension Verbalized understanding;Need further instruction             OT Long Term Goals - 06/09/14 1015    OT LONG TERM GOAL #1   Title . Patient to complete toileting with minimal assistance of one person in 12 weeks   Baseline mod to max assist, level flucuates daily   Time 12   Period Weeks   Status On-going   OT LONG TERM GOAL #2   Title Patient to complete bed to wheelchair transfer with minimal assist in 12 weeks.   Baseline mod to max assist, level flucuates   Time 12   Period Weeks   Status On-going   OT LONG TERM GOAL #3   Title Optimal edema control will be achieved, patient and caregiver to be independent in wearing of appropriate compression garments in 12 weeks.    Baseline increased edema and requiring compression bandaging to decongest legs.   Time 12   Period Weeks   Status On-going   OT LONG TERM GOAL #4   Title Husband to be independent with donning and doffing of compression daytime and night time garments in 12 weeks   Baseline unable    Time 12   Period Weeks   Status On-going   OT LONG TERM GOAL #5   Title Optimal edema control will be achieved and patient will be measured for and appropriate compression garment in 12 weeks   Baseline not yet decongested to be measured for custom garments   Time 12   Period  Weeks   Status On-going   Long Term Additional Goals   Additional Long Term Goals Yes   OT LONG TERM GOAL #6   Title Patient and family to be independent in skincare, HEP and self bandaging in 12 weeks   Baseline minimal cues and assist, familiar with compression bandaging from the past.   Time 12   Period Weeks   Status On-going   OT LONG TERM GOAL #7   Title  Patient will show a decrease in circumference in affected LE at toes 2cm , foot 3 cm ; ankle and calf 5cm, thighs 5 cm to fit in appropriate compression garments and in shoe in 12 weeks   Baseline see flowsheet for circumferential measurements   Time 12   Period Weeks   Status On-going               Plan - 06/09/14 1501    Clinical Impression Statement Patient continues to demonstrate slow progress with decongestion of the lower extremities with this episode of care.  Her circumferential measurements have minimal changes which appear to be due to side effects of chemo treatment and medications.  She needs to continue with compression bandaging since without compression she would likely have a greater volume of edema and effects of lymphedema.  Since husband is familiar with compression bandaging, he will continue to wrap at home and come for therapist reassessment 1-2 times a week to monitor circumferential measurements, open area and any changes needed for compression wrapping.   Pt will benefit from skilled therapeutic intervention in order to improve on the following deficits (Retired) Difficulty walking;Impaired flexibility;Increased edema;Decreased skin integrity;Decreased strength;Decreased mobility;Decreased balance;Decreased activity tolerance  Rehab Potential Good   Clinical Impairments Affecting Rehab Potential currently ungoing chemo treatments, chemo medications and inability to take her medications for MS   OT Frequency Other (comment)  1-2 times a week while undergoing chemo   OT Duration 12 weeks   OT  Treatment/Interventions Self-care/ADL training;Therapeutic exercise;Functional Mobility Training;Patient/family education;Manual Therapy;Manual lymph drainage;DME and/or AE instruction;Compression bandaging   Plan Will plan to see patient in one week.   OT Home Exercise Plan Pt has exercises for lower extremity to be performed at home in compression.   Consulted and Agree with Plan of Care Patient        Problem List Patient Active Problem List   Diagnosis Date Noted  . Hematoma complicating a procedure 03/06/2014  . Breast cancer 01/10/2014  . SOB (shortness of breath) 03/01/2013  . Bilateral lower extremity edema   . Aortic insufficiency   . Syncope 06/08/2011  . Herpes zoster 06/08/2011  . Multiple sclerosis    Achilles Dunk, OTR/L, CLT 06/09/2014, 3:25 PM  [place] MAIN [facility] SERVICES [address] [place], [place], [address] Phone: [phone]   Fax:  [phone]

## 2014-06-11 ENCOUNTER — Other Ambulatory Visit: Payer: Self-pay | Admitting: Oncology

## 2014-06-12 ENCOUNTER — Ambulatory Visit: Payer: Medicare Other | Admitting: Oncology

## 2014-06-12 ENCOUNTER — Other Ambulatory Visit: Payer: Medicare Other

## 2014-06-12 ENCOUNTER — Ambulatory Visit: Payer: Medicare Other

## 2014-06-13 ENCOUNTER — Ambulatory Visit: Payer: Medicare Other | Admitting: Occupational Therapy

## 2014-06-16 ENCOUNTER — Telehealth: Payer: Self-pay | Admitting: Diagnostic Neuroimaging

## 2014-06-16 ENCOUNTER — Ambulatory Visit: Payer: Medicare Other | Admitting: Occupational Therapy

## 2014-06-16 MED ORDER — OXYBUTYNIN CHLORIDE ER 10 MG PO TB24: 20.0000 mg | ORAL_TABLET | Freq: Every day | ORAL | Status: DC

## 2014-06-16 NOTE — Telephone Encounter (Signed)
Patient called again and wants to know when her Rx. Will be ready, she stated that she needs it today. Please call and advise.

## 2014-06-16 NOTE — Telephone Encounter (Signed)
Patient called that patient can not get a refill for oxybutynin (DITROPAN-XL) 10 MG 24 hr tablet until Dr. Leta Baptist requests this script to be doubled since her PCP was the one to increase the dosage her pharmacy advised her that DR. Penumalli will have to call that in. Please call and advice # [phone]

## 2014-06-16 NOTE — Telephone Encounter (Signed)
Rx has been updated and sent.  I called the patient back to advise.  She is aware and will follow up with the pharmacy.

## 2014-06-16 NOTE — Telephone Encounter (Signed)
I called back to clarify.  Patient was having issues with frequent urination.  She spoke with her PCP who recommended she try to take two tabs of Ditropan daily instead of one.  Says she has been taking this dose for a couple of weeks and it has seemed to work well.  She would like to know if we will write a new Rx for with these directions.  Please advise.  Thank you.

## 2014-06-16 NOTE — Telephone Encounter (Signed)
Yes, pls go ahead with rx. -VRP

## 2014-06-18 ENCOUNTER — Ambulatory Visit: Payer: Medicare Other | Admitting: Occupational Therapy

## 2014-06-18 DIAGNOSIS — M6281 Muscle weakness (generalized): Secondary | ICD-10-CM | POA: Diagnosis not present

## 2014-06-18 DIAGNOSIS — R262 Difficulty in walking, not elsewhere classified: Secondary | ICD-10-CM | POA: Diagnosis not present

## 2014-06-18 DIAGNOSIS — M79662 Pain in left lower leg: Secondary | ICD-10-CM | POA: Diagnosis not present

## 2014-06-18 DIAGNOSIS — I890 Lymphedema, not elsewhere classified: Secondary | ICD-10-CM | POA: Diagnosis not present

## 2014-06-18 DIAGNOSIS — M79661 Pain in right lower leg: Secondary | ICD-10-CM | POA: Diagnosis not present

## 2014-06-18 NOTE — Therapy (Signed)
[place] MAIN [facility] SERVICES [address] [place], [place], [address] Phone: [phone]   Fax:  [phone]  Occupational Therapy Treatment  Patient Details  Name: Rebecca [name] MRN: [id number] Date of Birth: May 18, 1960 Referring Provider:  Bobetta Lime, MD  Encounter Date: 06/18/2014      OT End of Session - 06/18/14 1547    Visit Number 4   Number of Visits 36   Date for OT Re-Evaluation 07/23/14   Authorization Type Medicare G code visit 4   Authorization Time Period of 10   OT Start Time 1310   OT Stop Time 1430   OT Time Calculation (min) 80 min      Past Medical History  Diagnosis Date  . Herpes zoster   . Multiple sclerosis 2001    Walks from room to room @ home; but Wheelchair when going out.  . Closed head injury with brief loss of consciousness   . Seizures     Takes Keppra  . IBS (irritable bowel syndrome)     no longer  . Bacterial endocarditis     History of .  Marland Kitchen Aortic valve disease     Mild AS / AI (cannot exclude Bicuspid AoV)  . Syncope and collapse   . Bilateral lower extremity edema     Noncardiac.  Chronic. LE Venous dopplers - negative for DVT.; Echocardiogram January 2016: Normal EF with normal wall motion and valve function. Only grade 1 diastolic dysfunction. EF 60-65%. Mild MR  . Neuromuscular disorder     MS  . Heart murmur   . Lymphedema     has legs [facility]  . Cervical stenosis of spine   . Cancer 12-31-13    Right breast, 12:00, 1.5 cm, T1c,N0 invasive mammary carcinoma, triple negative.  . Breast cancer   . MS (multiple sclerosis)   . Irritable bowel     Past Surgical History  Procedure Laterality Date  . Cholecystectomy    . Ankle surgery      Left  . Port-a-cath removal      right  . Anterior cervical decomp/discectomy fusion  11/17/2011    Procedure: ANTERIOR CERVICAL DECOMPRESSION/DISCECTOMY FUSION 2 LEVELS;  Surgeon: Erline Levine, MD;  Location: [place] NEURO ORS;   Service: Neurosurgery;  Laterality: N/A;  Cervical Five-Six Six-Seven Anterior cervical decompression/diskectomy/fusion  . Transthoracic echocardiogram  03/2013; 02/2014    a) Normal LV size and function with EF 60-65%.; Cannot exclude bicuspid aortic valve with mild AS and mild AI.; b) Normal EF with normal wall motion and valve function x Mild MR. G2 DD. EF 60-65%. Tricuspid AoV  . Lower extremity venous dopplers  Feb 27, 2013    No LE DVT  . Port a cath insertion Right 01/19/2010  . Colonoscopy  2014  . Upper gi endoscopy  2014  . Port-a-cath removal Right 09/03/2013    Procedure: REMOVAL PORT-A-CATH;  Surgeon: Conrad [place], MD;  Location: [place];  Service: Vascular;  Laterality: Right;  . Breast biopsy Right 12-31-13    invasive mammary  . Breast surgery Right 02/03/2014    Right simple mastectomy with sentinel node biopsy.  . Mastectomy    . Ankle surgery      There were no vitals filed for this visit.  Visit Diagnosis:  Lymphedema      Subjective Assessment - 06/18/14 1517    Patient is accompained by: Family member  LYMPHEDEMA/ONCOLOGY QUESTIONNAIRE - 06/18/14 1537    Right Lower Extremity Lymphedema   At Midpatella/Popliteal Crease 54.9 cm   30 cm Proximal to Floor at Lateral Plantar Foot 57.8 cm   20 cm Proximal to Floor at Lateral Plantar Foot 52.7 1   10  cm Proximal to Floor at Lateral Malleoli 30 cm   5 cm Proximal to 1st MTP Joint 23.5 cm   Across MTP Joint 25.9 cm   Around Proximal Great Toe 9.3 cm   Other 34.7   Left Lower Extremity Lymphedema   At Midpatella/Popliteal Crease 49.5 cm   30 cm Proximal to Floor at Lateral Plantar Foot 58.5 cm   20 cm Proximal to Floor at Lateral Plantar Foot 51 cm   10 cm Proximal to Floor at Lateral Malleoli 31 cm   5 cm Proximal to 1st MTP Joint 23.8 cm   Across MTP Joint 24.5 cm   Around Proximal Great Toe 9.6 cm   Other 35.5                 OT Treatments/Exercises (OP) - 06/18/14 0001    Manual  Therapy   Edema Management Patient arrived with bandages intact on bilateral lower extremities from foot to knee. Therapist removed bandages and performed skin inspection. L medial leg wound is unchanged; however, Pt presents w/ new ~ 1 cm round open wound at R medial gluteal fold. Edges appear like skin tear and wound appears to penetrate dermis, although OT did not probe wound depth. At present here are no signs/ symptoms of infection, but Pt encouraged to seek referral to wound clinic immediately to limit infection risk. OT performed circumferential measurements, (see flow sheet),  Skin care to bilateral lower extremities included bathing and debriding with washcloth below knees, application of abdominal pad to left lower extremity at wound site, and application of low pH Eucerin lotion in an effort to improve hydration. We attempted to calibrate knee length Ginger Organ for use when bandaging is inconvenient during chemotherapy regime, but unfortunately limb swelling is increased so custom boots no longer fit. Compression wraps applied as established bilterally from AD. Stockinette applied from bilateral foot to knee. Artiflex 10cm to foot and ankle. Artiflex 12 cm to ankle, calf and to the knee. Short stretch bandages applied as follows: one 6 cm bandage applied to foot and up to ankle. One 8 cm bandage applied from foot to mid leg. One 10 cm bandage from ankle to calf. All bandages applied with 50% stretch in a circular wrap pattern. Topper added to keep tape and bandages in place.   -                     OT Long Term Goals - 06/09/14 1015    OT LONG TERM GOAL #1   Title . Patient to complete toileting with minimal assistance of one person in 12 weeks   Baseline mod to max assist, level flucuates daily   Time 12   Period Weeks   Status On-going   OT LONG TERM GOAL #2   Title Patient to complete bed to wheelchair transfer with minimal assist in 12 weeks.   Baseline mod to max assist,  level flucuates   Time 12   Period Weeks   Status On-going   OT LONG TERM GOAL #3   Title Optimal edema control will be achieved, patient and caregiver to be independent in wearing of appropriate compression garments in 12 weeks.  Baseline increased edema and requiring compression bandaging to decongest legs.   Time 12   Period Weeks   Status On-going   OT LONG TERM GOAL #4   Title Husband to be independent with donning and doffing of compression daytime and night time garments in 12 weeks   Baseline unable    Time 12   Period Weeks   Status On-going   OT LONG TERM GOAL #5   Title Optimal edema control will be achieved and patient will be measured for and appropriate compression garment in 12 weeks   Baseline not yet decongested to be measured for custom garments   Time 12   Period Weeks   Status On-going   Long Term Additional Goals   Additional Long Term Goals Yes   OT LONG TERM GOAL #6   Title Patient and family to be independent in skincare, HEP and self bandaging in 12 weeks   Baseline minimal cues and assist, familiar with compression bandaging from the past.   Time 12   Period Weeks   Status On-going   OT LONG TERM GOAL #7   Title  Patient will show a decrease in circumference in affected LE at toes 2cm , foot 3 cm ; ankle and calf 5cm, thighs 5 cm to fit in appropriate compression garments and in shoe in 12 weeks   Baseline see flowsheet for circumferential measurements   Time 12   Period Weeks   Status On-going               Plan - 06/18/14 1549    Clinical Impression Statement Pt presents with worsening BLE lymphedema today, which we suspect is due in part to decreased functional mobility/ ambulation, decreased overall activity level during breast cancer treatment, dependent positioning, and increased fluid retention 2/2 steroids affect.  Pt continues to benefit from skilled OT for lymphedema management. Volumetric measurements reveal a dramatic increase in  limb swelling over time since completing comprehensive CDT course in October. Pt would benefit from increased treatment frequency to 3-5 x  weekly  however this increased frequency will result in family hardship during cancer Rx at this time.    Pt will benefit from skilled therapeutic intervention in order to improve on the following deficits (Retired) Difficulty walking;Impaired flexibility;Increased edema;Decreased skin integrity;Decreased strength;Decreased mobility;Decreased balance;Decreased activity tolerance   Rehab Potential Good   Clinical Impairments Affecting Rehab Potential currently ungoing chemo treatments, chemo medications and inability to take her medications for MS- Pt stopping by wound care clinic after this visit to get referral recommendations   OT Frequency Other (comment)   OT Duration 12 weeks   OT Treatment/Interventions Self-care/ADL training;Therapeutic exercise;Functional Mobility Training;Patient/family education;Manual Therapy;Manual lymph drainage;DME and/or AE instruction;Compression bandaging   Plan Cont as per management phase POC while awaiting plan for wound care. Increase Rx frequency to 3 x weekly for trial period in effort to reduce limb volumes to refit custom Reid sleeves for use when wraps access is inconvenient. Cont to suppoprt spouse/ 1 caregiver PRN.   Consulted and Agree with Plan of Care Patient;Family member/caregiver   Family Member Consulted spouse- discussed and agreed on POC        Problem List Patient Active Problem List   Diagnosis Date Noted  . Hematoma complicating a procedure 03/06/2014  . Breast cancer 01/10/2014  . SOB (shortness of breath) 03/01/2013  . Bilateral lower extremity edema   . Aortic insufficiency   . Syncope 06/08/2011  . Herpes zoster 06/08/2011  .  Multiple sclerosis     Achilles Dunk, OTR/L, CLT  06/18/2014, 4:33 PM  [place] MAIN [facility] SERVICES [address]  [place], [place], [address] Phone: [phone]   Fax:  [phone]

## 2014-06-18 NOTE — Patient Instructions (Signed)
Because Pt reported sitting in recliner with legs in dependent position throughout the day and during HOS, Pt instructed on importance of leg elevation for gravity assist edema control. Pt agrees to elevate legs whenever seated. Reviewed availability of Action gel sheet for pressure relief and sheer reduction when seated in recliner. Provided printed resource. Reviewed signs and symptoms of cellulitis/ infection and importance of impeccable skin care to limit infection risk. Reviewed importance of seeking medical attention ASAP w/ s/s infection to limit progression.

## 2014-06-19 ENCOUNTER — Inpatient Hospital Stay: Payer: Medicare Other | Attending: Oncology

## 2014-06-19 ENCOUNTER — Inpatient Hospital Stay (HOSPITAL_BASED_OUTPATIENT_CLINIC_OR_DEPARTMENT_OTHER): Payer: Medicare Other | Admitting: Oncology

## 2014-06-19 ENCOUNTER — Inpatient Hospital Stay: Payer: Medicare Other

## 2014-06-19 ENCOUNTER — Other Ambulatory Visit: Payer: Self-pay | Admitting: Oncology

## 2014-06-19 VITALS — BP 116/72 | HR 77 | Temp 96.2°F | Resp 18

## 2014-06-19 DIAGNOSIS — C50911 Malignant neoplasm of unspecified site of right female breast: Secondary | ICD-10-CM

## 2014-06-19 DIAGNOSIS — G35 Multiple sclerosis: Secondary | ICD-10-CM | POA: Insufficient documentation

## 2014-06-19 DIAGNOSIS — G629 Polyneuropathy, unspecified: Secondary | ICD-10-CM | POA: Insufficient documentation

## 2014-06-19 DIAGNOSIS — I890 Lymphedema, not elsewhere classified: Secondary | ICD-10-CM | POA: Diagnosis not present

## 2014-06-19 DIAGNOSIS — Z79899 Other long term (current) drug therapy: Secondary | ICD-10-CM | POA: Diagnosis not present

## 2014-06-19 DIAGNOSIS — Z171 Estrogen receptor negative status [ER-]: Secondary | ICD-10-CM

## 2014-06-19 LAB — CBC WITH DIFFERENTIAL/PLATELET
BASOS ABS: 0 10*3/uL (ref 0–0.1)
Basophils Relative: 1 %
Eosinophils Absolute: 0.1 10*3/uL (ref 0–0.7)
Eosinophils Relative: 3 %
HEMATOCRIT: 29.6 % — AB (ref 35.0–47.0)
HEMOGLOBIN: 9.8 g/dL — AB (ref 12.0–16.0)
LYMPHS ABS: 0.6 10*3/uL — AB (ref 1.0–3.6)
Lymphocytes Relative: 17 %
MCH: 31.2 pg (ref 26.0–34.0)
MCHC: 33.3 g/dL (ref 32.0–36.0)
MCV: 93.7 fL (ref 80.0–100.0)
Monocytes Absolute: 0.5 10*3/uL (ref 0.2–0.9)
Monocytes Relative: 14 %
NEUTROS ABS: 2.1 10*3/uL (ref 1.4–6.5)
Neutrophils Relative %: 65 %
Platelets: 152 10*3/uL (ref 150–440)
RBC: 3.16 MIL/uL — AB (ref 3.80–5.20)
RDW: 16.9 % — ABNORMAL HIGH (ref 11.5–14.5)
WBC: 3.2 10*3/uL — AB (ref 3.6–11.0)

## 2014-06-19 LAB — COMPREHENSIVE METABOLIC PANEL
ALT: 18 U/L (ref 14–54)
ANION GAP: 4 — AB (ref 5–15)
AST: 22 U/L (ref 15–41)
Albumin: 4 g/dL (ref 3.5–5.0)
Alkaline Phosphatase: 70 U/L (ref 38–126)
BUN: 15 mg/dL (ref 6–20)
CHLORIDE: 104 mmol/L (ref 101–111)
CO2: 28 mmol/L (ref 22–32)
CREATININE: 0.41 mg/dL — AB (ref 0.44–1.00)
Calcium: 8.9 mg/dL (ref 8.9–10.3)
GFR calc Af Amer: >60 mL/min (ref >60)
GFR calc non Af Amer: >60 mL/min (ref >60)
GLUCOSE: 114 mg/dL — AB (ref 65–99)
Potassium: 3.7 mmol/L (ref 3.5–5.1)
Sodium: 136 mmol/L (ref 135–145)
Total Bilirubin: 0.4 mg/dL (ref 0.3–1.2)
Total Protein: 7 g/dL (ref 6.5–8.1)

## 2014-06-19 MED ORDER — HEPARIN SOD (PORK) LOCK FLUSH 100 UNIT/ML IV SOLN
500.0000 [IU] | Freq: Once | INTRAVENOUS | Status: AC | PRN
  Administered 2014-06-19: 500 [IU]
  Filled 2014-06-19: qty 5

## 2014-06-19 MED ORDER — OXYCODONE-ACETAMINOPHEN 5-325 MG PO TABS: 1.0000 | ORAL_TABLET | Freq: Four times a day (QID) | ORAL | Status: DC | PRN

## 2014-06-19 MED ORDER — FAMOTIDINE IN NACL 20-0.9 MG/50ML-% IV SOLN: 20.0000 mg | Freq: Once | INTRAVENOUS | Status: DC

## 2014-06-19 MED ORDER — SODIUM CHLORIDE 0.9 % IV SOLN
Freq: Once | INTRAVENOUS | Status: DC
  Filled 2014-06-19: qty 4

## 2014-06-19 MED ORDER — SODIUM CHLORIDE 0.9 % IV SOLN
Freq: Once | INTRAVENOUS | Status: DC
  Filled 2014-06-19: qty 1000

## 2014-06-19 MED ORDER — DIPHENHYDRAMINE HCL 50 MG/ML IJ SOLN: 25.0000 mg | Freq: Once | INTRAMUSCULAR | Status: DC

## 2014-06-19 MED ORDER — PACLITAXEL CHEMO INJECTION 300 MG/50ML: 72.0000 mg/m2 | Freq: Once | INTRAVENOUS | Status: DC

## 2014-06-20 ENCOUNTER — Ambulatory Visit: Payer: Medicare Other | Admitting: Occupational Therapy

## 2014-06-23 ENCOUNTER — Ambulatory Visit: Payer: Medicare Other | Admitting: Occupational Therapy

## 2014-06-23 DIAGNOSIS — M6281 Muscle weakness (generalized): Secondary | ICD-10-CM | POA: Diagnosis not present

## 2014-06-23 DIAGNOSIS — I890 Lymphedema, not elsewhere classified: Secondary | ICD-10-CM

## 2014-06-23 DIAGNOSIS — M79661 Pain in right lower leg: Secondary | ICD-10-CM | POA: Diagnosis not present

## 2014-06-23 DIAGNOSIS — R262 Difficulty in walking, not elsewhere classified: Secondary | ICD-10-CM | POA: Diagnosis not present

## 2014-06-23 DIAGNOSIS — M79662 Pain in left lower leg: Secondary | ICD-10-CM | POA: Diagnosis not present

## 2014-06-24 ENCOUNTER — Encounter: Payer: Self-pay | Admitting: Occupational Therapy

## 2014-06-24 NOTE — Therapy (Signed)
[place] [facility] SERVICES [address] [place], [place], [address] Phone: [phone]   Fax:  [phone]  Occupational Therapy Treatment  Patient Details  Name: Rebecca [name] MRN: [id number] Date of Birth: [birth year] Referring Provider:  Bobetta Lime, MD  Encounter Date: 06/23/2014      OT End of Session - 06/23/14 1030    Visit Number 5   Number of Visits 36   Date for OT Re-Evaluation 07/23/14   Authorization Type Medicare G code visit 5   Authorization Time Period of 10   OT Start Time 0900   OT Stop Time 1010   OT Time Calculation (min) 70 min   Behavior During Therapy [facility] for tasks assessed/performed      Past Medical History  Diagnosis Date  . Herpes zoster   . Multiple sclerosis 2001    Walks from room to room @ home; but Wheelchair when going out.  . Closed head injury with brief loss of consciousness   . Seizures     Takes Keppra  . IBS (irritable bowel syndrome)     no longer  . Bacterial endocarditis     History of .  Marland Kitchen Aortic valve disease     Mild AS / AI (cannot exclude Bicuspid AoV)  . Syncope and collapse   . Bilateral lower extremity edema     Noncardiac.  Chronic. LE Venous dopplers - negative for DVT.; Echocardiogram January 2016: Normal EF with normal wall motion and valve function. Only grade 1 diastolic dysfunction. EF 60-65%. Mild MR  . Neuromuscular disorder     MS  . Heart murmur   . Lymphedema     has legs wrapped at [facility]  . Cervical stenosis of spine   . Cancer 12-31-13    Right breast, 12:00, 1.5 cm, T1c,N0 invasive mammary carcinoma, triple negative.  . Breast cancer   . MS (multiple sclerosis)   . Irritable bowel     Past Surgical History  Procedure Laterality Date  . Cholecystectomy    . Ankle surgery      Left  . Port-a-cath removal      right  . Anterior cervical decomp/discectomy fusion  11/17/2011    Procedure: ANTERIOR CERVICAL DECOMPRESSION/DISCECTOMY FUSION 2 LEVELS;   Surgeon: Erline Levine, MD;  Location: [place] [facility];  Service: Neurosurgery;  Laterality: N/A;  Cervical Five-Six Six-Seven Anterior cervical decompression/diskectomy/fusion  . Transthoracic echocardiogram  03/2013; 02/2014    a) Normal LV size and function with EF 60-65%.; Cannot exclude bicuspid aortic valve with mild AS and mild AI.; b) Normal EF with normal wall motion and valve function x Mild MR. G2 DD. EF 60-65%. Tricuspid AoV  . Lower extremity venous dopplers  Feb 27, 2013    No LE DVT  . Port a cath insertion Right 01/19/2010  . Colonoscopy  2014  . Upper gi endoscopy  2014  . Port-a-cath removal Right 09/03/2013    Procedure: REMOVAL PORT-A-CATH;  Surgeon: Conrad , MD;  Location: [place];  Service: Vascular;  Laterality: Right;  . Breast biopsy Right 12-31-13    invasive mammary  . Breast surgery Right 02/03/2014    Right simple mastectomy with sentinel node biopsy.  . Mastectomy    . Ankle surgery      There were no vitals filed for this visit.  Visit Diagnosis:  Lymphedema      Subjective Assessment - 06/23/14 1030    Subjective  Patient reports she went  to chemo last week but her doctor decided to hold chemo for a few weeks due to her leg edema and issues with open areas on her left leg and gluteal fold on the right.  She obtained an order for the wound care center and has not heard back in regards to an appt.  Tehy are planning to go by there today and  check on the status. She and her husband feel the small area on her left leg is improving.  She is keeping a bandage on the area of the gluteal fold to keep the area from tearing and shearing with transfers and toileting.    Patient is accompained by: Family member   Limitations Patient has been receiving chemo, slow healing process.   Patient Stated Goals Patient wants to get her legs back down to a smaller size, be able to transfer and walk better as well as be able to help more with her self care tasks, especially  toileting.   Currently in Pain? No/denies   Multiple Pain Sites No             LYMPHEDEMA/ONCOLOGY QUESTIONNAIRE - 06/24/14 1341    Right Lower Extremity Lymphedema   At Midpatella/Popliteal Crease 49 cm   30 cm Proximal to Floor at Lateral Plantar Foot 56.3 cm   20 cm Proximal to Floor at Lateral Plantar Foot 51.5 1   10  cm Proximal to Floor at Lateral Malleoli 29.7 cm   5 cm Proximal to 1st MTP Joint 23.5 cm   Across MTP Joint 26 cm   Around Proximal Great Toe 10.1 cm   Other 35.4   Left Lower Extremity Lymphedema   At Midpatella/Popliteal Crease 49 cm   30 cm Proximal to Floor at Lateral Plantar Foot 55.1 cm   20 cm Proximal to Floor at Lateral Plantar Foot 49.1 cm   10 cm Proximal to Floor at Lateral Malleoli 30.9 cm   5 cm Proximal to 1st MTP Joint 24 cm   Across MTP Joint 24 cm   Around Proximal Great Toe 10.3 cm   Other 35.6                 OT Treatments/Exercises (OP) - 06/24/14 0001    Manual Therapy   Edema Management Patient arrived with bandages intact on bilateral lower extremities from foot to knee. Therapist removed bandages and performed skin inspection. Patient did not have any open areas noted on her right lower extremity, however she still has one open area on the left leg, medial aspect of the leg about 1/2 way up her lower leg,reduced to 2 cm wide by 1 cm height. Circumferential measurements taken and recorded on flowsheet. Patient seen for Skin care of bilateral lower extremities with application of abdominal pad to left lower extremity to medial aspect of lower leg for area where a blister was previously formed and is still open with some minimal clear fluid drainage. Stockinette applied from bilateral foot to knee. Artiflex 10cm to foot and ankle. Then switched this date to rosidal soft roll from ankle to knee bilaterally to help with decongestion and potentially help with healing process of area on the left.  Short stretch bandages applied as  follows: one 6 cm bandage applied to foot and up to calf. One 8 cm bandage applied from foot to mid leg. One 10 cm bandage from mid leg to knee.  All bandages applied with 50% stretch in a circular wrap pattern. Topper added to keep  tape and bandages in place.                 OT Education - 06/23/14 1030    Education provided Yes   Education Details Husband will need to continue to re wrap left lower extremity daily   Person(s) Educated Spouse;Patient   Methods Explanation;Demonstration   Comprehension Verbalized understanding;Returned demonstration;Verbal cues required             OT Long Term Goals - 06/09/14 1015    OT LONG TERM GOAL #1   Title . Patient to complete toileting with minimal assistance of one person in 12 weeks   Baseline mod to max assist, level flucuates daily   Time 12   Period Weeks   Status On-going   OT LONG TERM GOAL #2   Title Patient to complete bed to wheelchair transfer with minimal assist in 12 weeks.   Baseline mod to max assist, level flucuates   Time 12   Period Weeks   Status On-going   OT LONG TERM GOAL #3   Title Optimal edema control will be achieved, patient and caregiver to be independent in wearing of appropriate compression garments in 12 weeks.    Baseline increased edema and requiring compression bandaging to decongest legs.   Time 12   Period Weeks   Status On-going   OT LONG TERM GOAL #4   Title Husband to be independent with donning and doffing of compression daytime and night time garments in 12 weeks   Baseline unable    Time 12   Period Weeks   Status On-going   OT LONG TERM GOAL #5   Title Optimal edema control will be achieved and patient will be measured for and appropriate compression garment in 12 weeks   Baseline not yet decongested to be measured for custom garments   Time 12   Period Weeks   Status On-going   Long Term Additional Goals   Additional Long Term Goals Yes   OT LONG TERM GOAL #6   Title  Patient and family to be independent in skincare, HEP and self bandaging in 12 weeks   Baseline minimal cues and assist, familiar with compression bandaging from the past.   Time 12   Period Weeks   Status On-going   OT LONG TERM GOAL #7   Title  Patient will show a decrease in circumference in affected LE at toes 2cm , foot 3 cm ; ankle and calf 5cm, thighs 5 cm to fit in appropriate compression garments and in shoe in 12 weeks   Baseline see flowsheet for circumferential measurements   Time 12   Period Weeks   Status On-going               Plan - 06/24/14 1349    Clinical Impression Statement Patient demonstrated progress in decongestion of the bilateral lower extremities this date with circumferential measurements, mostly in the areas of the ankle to calf on both sides.  Switched materials from artiflex to rosidal soft in this area to see if this will help promote greater decongestion in the legs.  Also switched bandaging patterns at the foot  since her foot area has been decongested and the bulk of her edema is now in the calf area of the legs.  Recommend patient attempt to come consistently this week, MWF since her chemo is on hold for consistent compression wrapping and treatment to see if legs with respond favorably which will help withwound  healing and decongestion of the lower extremities to minimize any additional wounds or infections.    Pt will benefit from skilled therapeutic intervention in order to improve on the following deficits (Retired) Difficulty walking;Impaired flexibility;Increased edema;Decreased skin integrity;Decreased strength;Decreased mobility;Decreased balance;Decreased activity tolerance   Rehab Potential Good   Clinical Impairments Affecting Rehab Potential Patient has 2 open areas on lower extremities which are slow to heal and may need to be assessed at the wound care clinic.  MD has put chemo on hold temporarily.   OT Frequency Other (comment)   OT  Duration 12 weeks   OT Treatment/Interventions Self-care/ADL training;Therapeutic exercise;Functional Mobility Training;Patient/family education;Manual Therapy;Manual lymph drainage;DME and/or AE instruction;Compression bandaging   OT Home Exercise Plan Pt has exercises for lower extremity to be performed at home in compression.   Consulted and Agree with Plan of Care Patient;Family member/caregiver   Family Member Consulted spouse- discussed and agreed on POC        Problem List Patient Active Problem List   Diagnosis Date Noted  . Hematoma complicating a procedure 03/06/2014  . Breast cancer 01/10/2014  . SOB (shortness of breath) 03/01/2013  . Bilateral lower extremity edema   . Aortic insufficiency   . Syncope 06/08/2011  . Herpes zoster 06/08/2011  . Multiple sclerosis    Achilles Dunk, OTR/L, CLT 06/24/2014, 1:56 PM  [place] [facility] SERVICES [address] [place], [place], [address] Phone: [phone]   Fax:  [phone]

## 2014-06-24 NOTE — Patient Instructions (Signed)
Patient to follow up with getting an appt at the wound care center to assess the area on her right gluteal fold as well as the small area on the left lower extremity.  She will need for her husband to continue to rewrap her left leg daily to help promote wound care.

## 2014-06-25 ENCOUNTER — Ambulatory Visit: Payer: Medicare Other | Admitting: Occupational Therapy

## 2014-06-27 ENCOUNTER — Ambulatory Visit: Payer: Medicare Other | Admitting: Occupational Therapy

## 2014-06-27 ENCOUNTER — Ambulatory Visit: Payer: Medicare Other | Admitting: Surgery

## 2014-06-30 ENCOUNTER — Ambulatory Visit: Payer: Medicare Other | Admitting: Surgery

## 2014-06-30 ENCOUNTER — Other Ambulatory Visit: Payer: Self-pay | Admitting: Oncology

## 2014-06-30 ENCOUNTER — Ambulatory Visit: Payer: Medicare Other | Admitting: Occupational Therapy

## 2014-07-01 ENCOUNTER — Encounter: Payer: Medicare Other | Attending: Surgery | Admitting: Surgery

## 2014-07-01 DIAGNOSIS — L97222 Non-pressure chronic ulcer of left calf with fat layer exposed: Secondary | ICD-10-CM | POA: Insufficient documentation

## 2014-07-01 DIAGNOSIS — Q820 Hereditary lymphedema: Secondary | ICD-10-CM | POA: Insufficient documentation

## 2014-07-01 DIAGNOSIS — G35 Multiple sclerosis: Secondary | ICD-10-CM | POA: Diagnosis not present

## 2014-07-01 DIAGNOSIS — L97221 Non-pressure chronic ulcer of left calf limited to breakdown of skin: Secondary | ICD-10-CM | POA: Diagnosis not present

## 2014-07-01 DIAGNOSIS — L98411 Non-pressure chronic ulcer of buttock limited to breakdown of skin: Secondary | ICD-10-CM | POA: Diagnosis not present

## 2014-07-01 DIAGNOSIS — I890 Lymphedema, not elsewhere classified: Secondary | ICD-10-CM | POA: Diagnosis not present

## 2014-07-01 NOTE — Progress Notes (Signed)
KALEIGHA, CHAMBERLIN ([id number]) Visit Report for 07/01/2014 Allergy List Details Patient Name: Rebecca [name], Rebecca [name]. Date of Service: 07/01/2014 11:30 AM Medical Record Number: [id number] Patient Account Number: [id number] Date of Birth/Sex: 02/05/1961 (54 y.o. Female) Treating RN: Baruch Gouty, RN, BSN, Velva Harman Primary Care Physician: Bobetta Lime Other Clinician: Referring Physician: Bobetta Lime Treating Physician/Extender: Frann Rider in Treatment: 0 Allergies Active Allergies No Known Allergies Allergy Notes Electronic Signature(s) Signed: 07/01/2014 4:37:28 PM By: Regan Lemming BSN, RN Entered By: Regan Lemming on 07/01/2014 11:14:33 Rebecca [name] ([id number]) -------------------------------------------------------------------------------- Arrival Information Details Patient Name: Rebecca [name]. Date of Service: 07/01/2014 11:30 AM Medical Record Number: [id number] Patient Account Number: [id number] Date of Birth/Sex: December 10, 1960 (53 y.o. Female) Treating RN: Baruch Gouty, RN, BSN, Velva Harman Primary Care Physician: Bobetta Lime Other Clinician: Referring Physician: Bobetta Lime Treating Physician/Extender: Frann Rider in Treatment: 0 Visit Information Patient Arrived: Wheel Chair Arrival Time: 11:34 Accompanied By: husband Transfer Assistance: Manual Patient Identification Verified: Yes Secondary Verification Process Yes Completed: Patient Requires Transmission-Based No Precautions: Patient Has Alerts: No Electronic Signature(s) Signed: 07/01/2014 4:37:28 PM By: Regan Lemming BSN, RN Entered By: Regan Lemming on 07/01/2014 11:36:29 Rebecca [name] ([id number]) -------------------------------------------------------------------------------- Clinic Level of Care Assessment Details Patient Name: Rebecca [name]. Date of Service: 07/01/2014 11:30 AM Medical Record Number: [id number] Patient Account Number: [id number] Date of Birth/Sex: [birth year] (54 y.o. Female) Treating RN:  Junious Dresser Primary Care Physician: Bobetta Lime Other Clinician: Referring Physician: Bobetta Lime Treating Physician/Extender: Frann Rider in Treatment: 0 Clinic Level of Care Assessment Items TOOL 2 Quantity Score []  - Use when only an EandM is performed on the INITIAL visit 0 ASSESSMENTS - Nursing Assessment / Reassessment []  - General Physical Exam (combine w/ comprehensive assessment (listed just 0 below) when performed on new pt. evals) []  - Comprehensive Assessment (HX, ROS, Risk Assessments, Wounds Hx, etc.) 0 ASSESSMENTS - Wound and Skin Assessment / Reassessment X - Simple Wound Assessment / Reassessment - one wound 1 5 []  - Complex Wound Assessment / Reassessment - multiple wounds 0 []  - Dermatologic / Skin Assessment (not related to wound area) 0 ASSESSMENTS - Ostomy and/or Continence Assessment and Care []  - Incontinence Assessment and Management 0 []  - Ostomy Care Assessment and Management (repouching, etc.) 0 PROCESS - Coordination of Care X - Simple Patient / Family Education for ongoing care 1 15 []  - Complex (extensive) Patient / Family Education for ongoing care 0 X - Staff obtains Programmer, systems, Records, Test Results / Process Orders 1 10 []  - Staff telephones HHA, Nursing Homes / Clarify orders / etc 0 []  - Routine Transfer to another Facility (non-emergent condition) 0 []  - Routine Hospital Admission (non-emergent condition) 0 X - New Admissions / Biomedical engineer / Ordering NPWT, Apligraf, etc. 1 15 []  - Emergency Hospital Admission (emergent condition) 0 X - Simple Discharge Coordination 1 10 ESSIE, LAGUNES M. ([id number]) []  - Complex (extensive) Discharge Coordination 0 PROCESS - Special Needs []  - Pediatric / Minor Patient Management 0 []  - Isolation Patient Management 0 []  - Hearing / Language / Visual special needs 0 []  - Assessment of Community assistance (transportation, D/C planning, etc.) 0 []  - Additional assistance / Altered  mentation 0 []  - Support Surface(s) Assessment (bed, cushion, seat, etc.) 0 INTERVENTIONS - Wound Cleansing / Measurement X - Wound Imaging (photographs - any number of wounds) 1 5 []  - Wound Tracing (instead of photographs) 0 X - Simple Wound Measurement - one wound 1 5 []  - Complex Wound Measurement -  multiple wounds 0 X - Simple Wound Cleansing - one wound 1 5 []  - Complex Wound Cleansing - multiple wounds 0 INTERVENTIONS - Wound Dressings X - Small Wound Dressing one or multiple wounds 1 10 []  - Medium Wound Dressing one or multiple wounds 0 []  - Large Wound Dressing one or multiple wounds 0 []  - Application of Medications - injection 0 INTERVENTIONS - Miscellaneous []  - External ear exam 0 []  - Specimen Collection (cultures, biopsies, blood, body fluids, etc.) 0 []  - Specimen(s) / Culture(s) sent or taken to Lab for analysis 0 []  - Patient Transfer (multiple staff / Harrel Lemon Lift / Similar devices) 0 []  - Simple Staple / Suture removal (25 or less) 0 []  - Complex Staple / Suture removal (26 or more) 0 [place], [place] ([id number]) []  - Hypo / Hyperglycemic Management (close monitor of Blood Glucose) 0 X - Ankle / Brachial Index (ABI) - do not check if billed separately 1 15 Has the patient been seen at the hospital within the last three years: Yes Total Score: 95 Level Of Care: New/Established - Level 3 Electronic Signature(s) Signed: 07/01/2014 3:19:03 PM By: Junious Dresser RN Entered By: Junious Dresser on 07/01/2014 12:39:33 Rebecca [name] ([id number]) -------------------------------------------------------------------------------- Encounter Discharge Information Details Patient Name: Rebecca [name]. Date of Service: 07/01/2014 11:30 AM Medical Record Number: [id number] Patient Account Number: [id number] Date of Birth/Sex: 06-06-1960 (54 y.o. Female) Treating RN: Baruch Gouty, RN, BSN, Velva Harman Primary Care Physician: Bobetta Lime Other Clinician: Referring Physician: Bobetta Lime Treating Physician/Extender: Frann Rider in Treatment: 0 Encounter Discharge Information Items Discharge Pain Level: 0 Discharge Condition: Stable Ambulatory Status: Wheelchair Discharge Destination: Home Transportation: Private Auto Accompanied By: husband Schedule Follow-up Appointment: No Medication Reconciliation completed and provided to Patient/Care No Norell Brisbin: Provided on Clinical Summary of Care: 07/01/2014 Form Type Recipient Paper Patient PF Electronic Signature(s) Signed: 07/01/2014 4:37:28 PM By: Regan Lemming BSN, RN Previous Signature: 07/01/2014 12:48:02 PM Version By: Ruthine Dose Entered By: Regan Lemming on 07/01/2014 12:48:30 Rebecca [name] ([id number]) -------------------------------------------------------------------------------- Lower Extremity Assessment Details Patient Name: Rebecca [name]. Date of Service: 07/01/2014 11:30 AM Medical Record Number: [id number] Patient Account Number: [id number] Date of Birth/Sex: December 24, 1960 (54 y.o. Female) Treating RN: Afful, RN, BSN, Velva Harman Primary Care Physician: Bobetta Lime Other Clinician: Referring Physician: Bobetta Lime Treating Physician/Extender: Frann Rider in Treatment: 0 Edema Assessment Assessed: Shirlyn Goltz: No] [Right: No] E[Left: dema] [Right: :] Calf Left: Right: Point of Measurement: 32 cm From Medial Instep 55.7 cm cm Ankle Left: Right: Point of Measurement: 10 cm From Medial Instep 24.4 cm cm Vascular Assessment Claudication: Claudication Assessment [Left:None] Pulses: Posterior Tibial Doppler: [Left:Multiphasic] Dorsalis Pedis Palpable: [Left:Yes] Doppler: [Left:Multiphasic] Extremity colors, hair growth, and conditions: Extremity Color: [Left:Mottled] Hair Growth on Extremity: [Left:No] Temperature of Extremity: [Left:Warm] Capillary Refill: [Left:< 3 seconds] Dependent Rubor: [Left:No] Blanched when Elevated: [Left:No] Lipodermatosclerosis: [Left:No] Blood  Pressure: Brachial: [Left:124] Dorsalis Pedis: 160 [Left:Dorsalis Pedis:] Ankle: Posterior Tibial: 155 [Left:Posterior Tibial: 1.29] Toe Nail Assessment Left: Right: DANNICA, [place]ICKHAM ([id number]) Thick: Yes Discolored: Yes Deformed: Yes Improper Length and Hygiene: Yes Electronic Signature(s) Signed: 07/01/2014 4:37:28 PM By: Regan Lemming BSN, RN Entered By: Regan Lemming on 07/01/2014 11:54:46 Rebecca [name] ([id number]) -------------------------------------------------------------------------------- Multi Wound Chart Details Patient Name: Rebecca [name]. Date of Service: 07/01/2014 11:30 AM Medical Record Number: [id number] Patient Account Number: [id number] Date of Birth/Sex: [birth year] (54 y.o. Female) Treating RN: Junious Dresser Primary Care Physician: Bobetta Lime Other Clinician: Referring Physician: Bobetta Lime Treating Physician/Extender: Christin Fudge  Weeks in Treatment: 0 Photos: [1:No Photos] [N/A:N/A] Wound Location: [1:Left Lower Leg - Medial] [N/A:N/A] Wounding Event: [1:Gradually Appeared] [N/A:N/A] Primary Etiology: [1:Lymphedema] [N/A:N/A] Comorbid History: [1:Arrhythmia, Seizure Disorder, Received Chemotherapy] [N/A:N/A] Date Acquired: [1:05/13/2014] [N/A:N/A] Weeks of Treatment: [1:0] [N/A:N/A] Wound Status: [1:Open] [N/A:N/A] Measurements L x W x D 0.8x0.6x0.1 [N/A:N/A] (cm) Area (cm) : [1:0.377] [N/A:N/A] Volume (cm) : [1:0.038] [N/A:N/A] % Reduction in Area: [1:0.00%] [N/A:N/A] % Reduction in Volume: 0.00% [N/A:N/A] Classification: [1:Partial Thickness] [N/A:N/A] Exudate Amount: [1:Small] [N/A:N/A] Exudate Type: [1:Serosanguineous] [N/A:N/A] Exudate Color: [1:red, brown] [N/A:N/A] Wound Margin: [1:Flat and Intact] [N/A:N/A] Granulation Amount: [1:Small (1-33%)] [N/A:N/A] Granulation Quality: [1:Red, Pink] [N/A:N/A] Necrotic Amount: [1:Small (1-33%)] [N/A:N/A] Epithelialization: [1:Large (67-100%)] [N/A:N/A] Periwound Skin Texture: Edema:  Yes [1:Excoriation: No Induration: No Callus: No Crepitus: No Fluctuance: No Friable: No Rash: No Scarring: No] [N/A:N/A] Periwound Skin [1:Moist: Yes] [N/A:N/A] Moisture: [1:Maceration: No Dry/Scaly: No] Periwound Skin Color: [N/A:N/A] Atrophie Blanche: No Cyanosis: No Ecchymosis: No Erythema: No Hemosiderin Staining: No Mottled: No Pallor: No Rubor: No Temperature: No Abnormality N/A N/A Tenderness on No N/A N/A Palpation: Wound Preparation: Ulcer Cleansing: N/A N/A Rinsed/Irrigated with Saline Topical Anesthetic Applied: None Treatment Notes Electronic Signature(s) Signed: 07/01/2014 3:19:03 PM By: Junious Dresser RN Entered By: Junious Dresser on 07/01/2014 12:29:16 Rebecca [name] ([id number]) -------------------------------------------------------------------------------- [facility] Details Patient Name: Rebecca [name]. Date of Service: 07/01/2014 11:30 AM Medical Record Number: [id number] Patient Account Number: [id number] Date of Birth/Sex: 06-08-1960 (54 y.o. Female) Treating RN: Junious Dresser Primary Care Physician: Bobetta Lime Other Clinician: Referring Physician: Bobetta Lime Treating Physician/Extender: Frann Rider in Treatment: 0 Active Inactive Abuse / Safety / Falls / Self Care Management Nursing Diagnoses: Impaired physical mobility Potential for falls Goals: Patient will remain injury free Date Initiated: 07/01/2014 Goal Status: Active Patient/caregiver will verbalize understanding of skin care regimen Date Initiated: 07/01/2014 Goal Status: Active Patient/caregiver will verbalize/demonstrate measures taken to prevent injury and/or falls Date Initiated: 07/01/2014 Goal Status: Active Patient/caregiver will verbalize/demonstrate understanding of what to do in case of emergency Date Initiated: 07/01/2014 Goal Status: Active Interventions: Assess fall risk on admission and as needed Assess: immobility, friction,  shearing, incontinence upon admission and as needed Assess impairment of mobility on admission and as needed per policy Provide education on fall prevention Notes: Orientation to the Wound Care Program Nursing Diagnoses: Knowledge deficit related to the wound healing center program Goals: Patient/caregiver will verbalize understanding of the [facility] Date Initiated: 07/01/2014 CHANNON, BROUGHER ([id number]) Goal Status: Active Interventions: Provide education on orientation to the wound center Notes: Venous Leg Ulcer Nursing Diagnoses: Actual venous Insuffiency (use after diagnosis is confirmed) Knowledge deficit related to disease process and management Goals: Patient will maintain optimal edema control Date Initiated: 07/01/2014 Goal Status: Active Patient/caregiver will verbalize understanding of disease process and disease management Date Initiated: 07/01/2014 Goal Status: Active Verify adequate tissue perfusion prior to therapeutic compression application Date Initiated: 07/01/2014 Goal Status: Active Interventions: Assess peripheral edema status every visit. Provide education on venous insufficiency Treatment Activities: Therapeutic compression applied : 07/01/2014 Notes: Wound/Skin Impairment Nursing Diagnoses: Impaired tissue integrity Knowledge deficit related to ulceration/compromised skin integrity Goals: Patient/caregiver will verbalize understanding of skin care regimen Date Initiated: 07/01/2014 Goal Status: Active Ulcer/skin breakdown will heal within 14 weeks Date Initiated: 07/01/2014 Goal Status: Active CHARON, SMEDBERG ([id number]) Interventions: Assess patient/caregiver ability to obtain necessary supplies Assess patient/caregiver ability to perform ulcer/skin care regimen upon admission and as needed Assess ulceration(s) every visit Provide education on ulcer and  skin care Treatment Activities: Skin care regimen initiated :  07/01/2014 Topical wound management initiated : 07/01/2014 Notes: Electronic Signature(s) Signed: 07/01/2014 3:19:03 PM By: Junious Dresser RN Entered By: Junious Dresser on 07/01/2014 12:29:01 Rebecca [name] ([id number]) -------------------------------------------------------------------------------- Pain Assessment Details Patient Name: Rebecca [name]. Date of Service: 07/01/2014 11:30 AM Medical Record Number: [id number] Patient Account Number: [id number] Date of Birth/Sex: 1960/05/05 (54 y.o. Female) Treating RN: Baruch Gouty, RN, BSN, Velva Harman Primary Care Physician: Bobetta Lime Other Clinician: Referring Physician: Bobetta Lime Treating Physician/Extender: Frann Rider in Treatment: 0 Active Problems Location of Pain Severity and Description of Pain Patient Has Paino Yes Site Locations Pain Location: Generalized Pain, Pain in Ulcers Rate the pain. Current Pain Level: 7 Character of Pain Describe the Pain: Aching, Cramping, Tender Pain Management and Medication Current Pain Management: How does your pain impact your activities of daily livingo Sleep: Yes Bathing: Yes Appetite: Yes Relationship With Others: Yes Bladder Continence: Yes Emotions: Yes Bowel Continence: Yes Work: Yes Toileting: Yes Drive: Yes Dressing: Yes Hobbies: Yes Electronic Signature(s) Signed: 07/01/2014 4:37:28 PM By: Regan Lemming BSN, RN Entered By: Regan Lemming on 07/01/2014 11:51:39 Rebecca [name] ([id number]) -------------------------------------------------------------------------------- Patient/Caregiver Education Details Patient Name: Rebecca [name]. Date of Service: 07/01/2014 11:30 AM Medical Record Number: [id number] Patient Account Number: [id number] Date of Birth/Gender: August 20, 1960 (54 y.o. Female) Treating RN: Baruch Gouty, RN, BSN, Velva Harman Primary Care Physician: Bobetta Lime Other Clinician: Referring Physician: Bobetta Lime Treating Physician/Extender: Frann Rider in  Treatment: 0 Education Assessment Education Provided To: Patient and Caregiver Education Topics Provided Venous: Methods: Explain/Verbal Responses: State content correctly Welcome To [facility]: Methods: Explain/Verbal Responses: State content correctly Wound/Skin Impairment: Methods: Explain/Verbal Responses: State content correctly Electronic Signature(s) Signed: 07/01/2014 4:37:28 PM By: Regan Lemming BSN, RN Entered By: Regan Lemming on 07/01/2014 12:49:12 Rebecca [name] ([id number]) -------------------------------------------------------------------------------- Wound Assessment Details Patient Name: Rebecca [name]. Date of Service: 07/01/2014 11:30 AM Medical Record Number: [id number] Patient Account Number: [id number] Date of Birth/Sex: [birth year] (54 y.o. Female) Treating RN: Afful, RN, BSN, [place] Primary Care Physician: Bobetta Lime Other Clinician: Referring Physician: Bobetta Lime Treating Physician/Extender: Frann Rider in Treatment: 0 Wound Status Wound Number: 1 Primary Lymphedema Etiology: Wound Location: Left Lower Leg - Medial Wound Open Wounding Event: Gradually Appeared Status: Date Acquired: 05/13/2014 Comorbid Arrhythmia, Seizure Disorder, Weeks Of Treatment: 0 History: Received Chemotherapy Clustered Wound: No Photos Photo Uploaded By: Regan Lemming on 07/01/2014 14:50:10 Wound Measurements Length: (cm) 0.8 Width: (cm) 0.6 Depth: (cm) 0.1 Area: (cm) 0.377 Volume: (cm) 0.038 % Reduction in Area: 0% % Reduction in Volume: 0% Epithelialization: Large (67-100%) Tunneling: No Undermining: No Wound Description Classification: Partial Thickness Wound Margin: Flat and Intact Exudate Amount: Small Exudate Type: Serosanguineous Exudate Color: red, brown Foul Odor After Cleansing: No Wound Bed Granulation Amount: Small (1-33%) Granulation Quality: Red, Pink Necrotic Amount: Small (1-33%) Necrotic Quality: Adherent  Amo, [place] M. ([id number]) Periwound Skin Texture Texture Color No Abnormalities Noted: No No Abnormalities Noted: No Callus: No Atrophie Blanche: No Crepitus: No Cyanosis: No Excoriation: No Ecchymosis: No Fluctuance: No Erythema: No Friable: No Hemosiderin Staining: No Induration: No Mottled: No Localized Edema: Yes Pallor: No Rash: No Rubor: No Scarring: No Temperature / Pain Moisture Temperature: No Abnormality No Abnormalities Noted: No Dry / Scaly: No Maceration: No Moist: Yes Wound Preparation Ulcer Cleansing: Rinsed/Irrigated with Saline Topical Anesthetic Applied: None Treatment Notes Wound #1 (Left, Medial Lower Leg) 1. Cleansed with: Clean wound with Normal Saline 4. Dressing Applied:  Prisma Ag 5. Secondary Dressing Applied Dry Gauze 7. Secured with Tape Notes Patient husband reapplied Lymphedema wraps Electronic Signature(s) Signed: 07/01/2014 4:37:28 PM By: Regan Lemming BSN, RN Entered By: Regan Lemming on 07/01/2014 11:59:14 Rebecca [name] ([id number]) -------------------------------------------------------------------------------- [facility] Details Patient Name: Rebecca [name]. Date of Service: 07/01/2014 11:30 AM Medical Record Number: [id number] Patient Account Number: [id number] Date of Birth/Sex: Nov 08, 1960 (54 y.o. Female) Treating RN: Afful, RN, BSN, Velva Harman Primary Care Physician: Bobetta Lime Other Clinician: Referring Physician: Bobetta Lime Treating Physician/Extender: Frann Rider in Treatment: 0 Vital Signs Time Taken: 11:50 Reference Range: 80 - 120 mg / dl Height (in): 64 Source: Stated Weight (lbs): 181 Source: Stated Body Mass Index (BMI): 31.1 Electronic Signature(s) Signed: 07/01/2014 4:37:28 PM By: Regan Lemming BSN, RN Entered By: Regan Lemming on 07/01/2014 11:52:46

## 2014-07-01 NOTE — Progress Notes (Signed)
LASHAWNA, POCHE ([id number]) Visit Report for 07/01/2014 Abuse/Suicide Risk Screen Details Patient Name: Rebecca [name], Rebecca [name]. Date of Service: 07/01/2014 11:30 AM Medical Record Number: [id number] Patient Account Number: [id number] Date of Birth/Sex: 10-14-1960 (54 y.o. Female) Treating RN: Afful, RN, BSN, Velva Harman Primary Care Physician: Bobetta Lime Other Clinician: Referring Physician: Bobetta Lime Treating Physician/Extender: Frann Rider in Treatment: 0 Abuse/Suicide Risk Screen Items Answer ABUSE/SUICIDE RISK SCREEN: Has anyone close to you tried to hurt or harm you recentlyo No Do you feel uncomfortable with anyone in your familyo No Has anyone forced you do things that you didnot want to doo No Do you have any thoughts of harming yourselfo No Patient displays signs or symptoms of abuse and/or neglect. No Electronic Signature(s) Signed: 07/01/2014 4:37:28 PM By: Regan Lemming BSN, RN Entered By: Regan Lemming on 07/01/2014 11:45:43 Rebecca [name] ([id number]) -------------------------------------------------------------------------------- Activities of Daily Living Details Patient Name: Rebecca [name], Rebecca [name]. Date of Service: 07/01/2014 11:30 AM Medical Record Number: [id number] Patient Account Number: [id number] Date of Birth/Sex: 06/14/1960 (54 y.o. Female) Treating RN: Baruch Gouty, RN, BSN, Velva Harman Primary Care Physician: Bobetta Lime Other Clinician: Referring Physician: Bobetta Lime Treating Physician/Extender: Frann Rider in Treatment: 0 Activities of Daily Living Items Answer Activities of Daily Living (Please select one for each item) Drive Automobile Not Able Take Medications Completely Able Use Telephone Completely Able Care for Appearance Need Assistance Use Toilet Need Assistance Bath / Shower Need Assistance Dress Self Need Assistance Feed Self Completely Able Walk Need Assistance Get In / Out Bed Need Assistance Housework Need Assistance Prepare  Meals Need Assistance Handle Money Need Assistance Shop for Self Need Assistance Electronic Signature(s) Signed: 07/01/2014 4:37:28 PM By: Regan Lemming BSN, RN Entered By: Regan Lemming on 07/01/2014 11:45:25 Rebecca [name] ([id number]) -------------------------------------------------------------------------------- Education Assessment Details Patient Name: Rebecca [name]. Date of Service: 07/01/2014 11:30 AM Medical Record Number: [id number] Patient Account Number: [id number] Date of Birth/Sex: July 17, 1960 (54 y.o. Female) Treating RN: Junious Dresser Primary Care Physician: Bobetta Lime Other Clinician: Referring Physician: Bobetta Lime Treating Physician/Extender: Frann Rider in Treatment: 0 Primary Learner Assessed: Patient Learning Preferences/Education Level/Primary Language Learning Preference: Explanation Highest Education Level: High School Preferred Language: English Cognitive Barrier Assessment/Beliefs Language Barrier: No Physical Barrier Assessment Impaired Vision: Yes Glasses Impaired Hearing: No Decreased Hand dexterity: No Knowledge/Comprehension Assessment Knowledge Level: High Comprehension Level: High Ability to understand written High instructions: Ability to understand verbal High instructions: Motivation Assessment Anxiety Level: Calm Cooperation: Cooperative Education Importance: Acknowledges Need Interest in Health Problems: Asks Questions Perception: Coherent Willingness to Engage in Self- High Management Activities: Readiness to Engage in Self- High Management Activities: Electronic Signature(s) Signed: 07/01/2014 3:19:03 PM By: Junious Dresser RN Entered By: Junious Dresser on 07/01/2014 11:44:09 Rebecca [name] ([id number]) -------------------------------------------------------------------------------- Fall Risk Assessment Details Patient Name: Rebecca [name]. Date of Service: 07/01/2014 11:30 AM Medical Record Number:  [id number] Patient Account Number: [id number] Date of Birth/Sex: 1960-03-04 (54 y.o. Female) Treating RN: Junious Dresser Primary Care Physician: Bobetta Lime Other Clinician: Referring Physician: Bobetta Lime Treating Physician/Extender: Frann Rider in Treatment: 0 Fall Risk Assessment Items FALL RISK ASSESSMENT: History of falling - immediate or within 3 months 25 Yes Secondary diagnosis 0 No Ambulatory aid None/bed rest/wheelchair/nurse 0 Yes Crutches/cane/walker 15 Yes Furniture 0 No IV Access/Saline Lock 0 No Gait/Training Normal/bed rest/immobile 0 No Weak 10 Yes Impaired 20 Yes Mental Status Oriented to own ability 0 Yes Electronic Signature(s) Signed: 07/01/2014 3:19:03 PM By: Junious Dresser RN Entered By: Junious Dresser on  07/01/2014 11:43:31 Rebecca [name], Rebecca [name] ([id number]) -------------------------------------------------------------------------------- Foot Assessment Details Patient Name: Rebecca [name], Rebecca [name]. Date of Service: 07/01/2014 11:30 AM Medical Record Number: [id number] Patient Account Number: [id number] Date of Birth/Sex: 03-Apr-1960 (54 y.o. Female) Treating RN: Junious Dresser Primary Care Physician: Bobetta Lime Other Clinician: Referring Physician: Bobetta Lime Treating Physician/Extender: Frann Rider in Treatment: 0 Foot Assessment Items Site Locations + = Sensation present, - = Sensation absent, C = Callus, U = Ulcer R = Redness, W = Warmth, M = Maceration, PU = Pre-ulcerative lesion F = Fissure, S = Swelling, D = Dryness Assessment Right: Left: Other Deformity: No No Prior Foot Ulcer: No No Prior Amputation: No No Charcot Joint: No No Ambulatory Status: Ambulatory With Help Assistance Device: Walker Gait: Administrator, arts) Signed: 07/01/2014 3:19:03 PM By: Junious Dresser RN Signed: 07/01/2014 4:37:28 PM By: Regan Lemming BSN, RN Entered By: Regan Lemming on 07/01/2014 12:00:38 Rebecca [name] ([id number] Blade,  Salomon Mast ([id number]) -------------------------------------------------------------------------------- Nutrition Risk Assessment Details Patient Name: Rebecca [name], Rebecca [name]. Date of Service: 07/01/2014 11:30 AM Medical Record Number: [id number] Patient Account Number: [id number] Date of Birth/Sex: 1960-02-25 (54 y.o. Female) Treating RN: Junious Dresser Primary Care Physician: Bobetta Lime Other Clinician: Referring Physician: Bobetta Lime Treating Physician/Extender: Frann Rider in Treatment: 0 Height (in): Weight (lbs): Body Mass Index (BMI): Nutrition Risk Assessment Items NUTRITION RISK SCREEN: I have an illness or condition that made me change the kind and/or 0 No amount of food I eat I eat fewer than two meals per day 0 No I eat few fruits and vegetables, or milk products 0 No I have three or more drinks of beer, liquor or wine almost every day 0 No I have tooth or mouth problems that make it hard for me to eat 0 No I don't always have enough money to buy the food I need 0 No I eat alone most of the time 0 No I take three or more different prescribed or over-the-counter drugs a 0 No day Without wanting to, I have lost or gained 10 pounds in the last six 0 No months I am not always physically able to shop, cook and/or feed myself 0 No Nutrition Protocols Good Risk Protocol 0 No interventions needed Moderate Risk Protocol Electronic Signature(s) Signed: 07/01/2014 3:19:03 PM By: Junious Dresser RN Entered By: Junious Dresser on 07/01/2014 11:43:06
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
## 2014-07-02 ENCOUNTER — Ambulatory Visit: Payer: Medicare Other | Admitting: Occupational Therapy

## 2014-07-02 ENCOUNTER — Encounter: Payer: Self-pay | Admitting: Occupational Therapy

## 2014-07-02 DIAGNOSIS — I890 Lymphedema, not elsewhere classified: Secondary | ICD-10-CM | POA: Diagnosis not present

## 2014-07-02 DIAGNOSIS — M79662 Pain in left lower leg: Secondary | ICD-10-CM | POA: Diagnosis not present

## 2014-07-02 DIAGNOSIS — R262 Difficulty in walking, not elsewhere classified: Secondary | ICD-10-CM | POA: Diagnosis not present

## 2014-07-02 DIAGNOSIS — M6281 Muscle weakness (generalized): Secondary | ICD-10-CM | POA: Diagnosis not present

## 2014-07-02 DIAGNOSIS — M79661 Pain in right lower leg: Secondary | ICD-10-CM | POA: Diagnosis not present

## 2014-07-02 NOTE — Therapy (Signed)
[place] MAIN [facility] SERVICES [address] [place], [place], [address] Phone: [phone]   Fax:  [phone]  Occupational Therapy Treatment  Patient Details  Name: Rebecca [name] MRN: [id number] Date of Birth: [birth year] Referring Provider:  Bobetta Lime, MD  Encounter Date: 07/02/2014      OT End of Session - 07/02/14 1030    Visit Number 6   Number of Visits 36   Date for OT Re-Evaluation 07/23/14   Authorization Type Medicare G code visit 6   Authorization Time Period of 10   OT Start Time 0901   OT Stop Time 1005   OT Time Calculation (min) 64 min   Activity Tolerance Patient tolerated treatment well;Patient limited by fatigue;Treatment limited secondary to medical complications (Comment)   Behavior During Therapy [facility] for tasks assessed/performed      Past Medical History  Diagnosis Date  . Herpes zoster   . Multiple sclerosis 2001    Walks from room to room @ home; but Wheelchair when going out.  . Closed head injury with brief loss of consciousness   . Seizures     Takes Keppra  . IBS (irritable bowel syndrome)     no longer  . Bacterial endocarditis     History of .  Marland Kitchen Aortic valve disease     Mild AS / AI (cannot exclude Bicuspid AoV)  . Syncope and collapse   . Bilateral lower extremity edema     Noncardiac.  Chronic. LE Venous dopplers - negative for DVT.; Echocardiogram January 2016: Normal EF with normal wall motion and valve function. Only grade 1 diastolic dysfunction. EF 60-65%. Mild MR  . Neuromuscular disorder     MS  . Heart murmur   . Lymphedema     has legs wrapped at [facility]  . Cervical stenosis of spine   . Cancer 12-31-13    Right breast, 12:00, 1.5 cm, T1c,N0 invasive mammary carcinoma, triple negative.  . Breast cancer   . MS (multiple sclerosis)   . Irritable bowel     Past Surgical History  Procedure Laterality Date  . Cholecystectomy    . Ankle surgery      Left  . Port-a-cath removal       right  . Anterior cervical decomp/discectomy fusion  11/17/2011    Procedure: ANTERIOR CERVICAL DECOMPRESSION/DISCECTOMY FUSION 2 LEVELS;  Surgeon: Erline Levine, MD;  Location: [place] NEURO ORS;  Service: Neurosurgery;  Laterality: N/A;  Cervical Five-Six Six-Seven Anterior cervical decompression/diskectomy/fusion  . Transthoracic echocardiogram  03/2013; 02/2014    a) Normal LV size and function with EF 60-65%.; Cannot exclude bicuspid aortic valve with mild AS and mild AI.; b) Normal EF with normal wall motion and valve function x Mild MR. G2 DD. EF 60-65%. Tricuspid AoV  . Lower extremity venous dopplers  Feb 27, 2013    No LE DVT  . Port a cath insertion Right 01/19/2010  . Colonoscopy  2014  . Upper gi endoscopy  2014  . Port-a-cath removal Right 09/03/2013    Procedure: REMOVAL PORT-A-CATH;  Surgeon: Conrad Pendergrass, MD;  Location: [place];  Service: Vascular;  Laterality: Right;  . Breast biopsy Right 12-31-13    invasive mammary  . Breast surgery Right 02/03/2014    Right simple mastectomy with sentinel node biopsy.  . Mastectomy    . Ankle surgery      There were no vitals filed for this visit.  Visit Diagnosis:  Lymphedema  Subjective Assessment - 07/02/14 1016    Subjective  Patient reports she went to the wound care center yesterday, was unable to go last week.  When they looked at the area on her gluteal fold, the area was healed and she did not require care.  The left leg they applied a covering to be changed with rebandaging for the next week. She reports she foam was comfortable and likes to have it on her feet.    Patient is accompained by: Family member   Limitations Patient has been receiving chemo, slow healing process.   Patient Stated Goals Patient wants to get her legs back down to a smaller size, be able to transfer and walk better as well as be able to help more with her self care tasks, especially toileting.   Currently in Pain? No/denies   Pain Score 0-No pain    Multiple Pain Sites No             LYMPHEDEMA/ONCOLOGY QUESTIONNAIRE - 07/02/14 1019    Right Lower Extremity Lymphedema   30 cm Proximal to Floor at Lateral Plantar Foot 54.8 cm   20 cm Proximal to Floor at Lateral Plantar Foot 46.5 1   10  cm Proximal to Floor at Lateral Malleoli 27.7 cm   5 cm Proximal to 1st MTP Joint 23 cm   Across MTP Joint 25.3 cm   Around Proximal Great Toe 9.6 cm   Other 33.3   Left Lower Extremity Lymphedema   30 cm Proximal to Floor at Lateral Plantar Foot 53.6 cm   20 cm Proximal to Floor at Lateral Plantar Foot 41.7 cm   10 cm Proximal to Floor at Lateral Malleoli 26.6 cm   5 cm Proximal to 1st MTP Joint 23.8 cm   Across MTP Joint 24 cm   Around Proximal Great Toe 9.6 cm   Other 33.5                 OT Treatments/Exercises (OP) - 07/02/14 0001    Manual Therapy   Edema Management Patient arrived with bandages intact on bilateral lower extremities from foot to knee. Therapist removed bandages and performed skin inspection. Patient did not have any open areas noted on her right lower extremity, however she still has one open area on the left leg, medial aspect of the leg about 1/2 way up her lower leg, has bandage covering from yesterday from wound care clinic.  Since she has had this less than 24 hours, we left this area intact and husband will change when he bandages on Friday.   Circumferential measurements taken and recorded on flowsheet. Patient seen for Skin care of bilateral lower extremities followed with application of Eucerin lotion from foot to knee bilaterally. Stockinette applied from bilateral foot to knee. Artiflex 10cm to foot and ankle. Then switched this date to rosidal soft roll from foot to mid calf and then another roll from above ankle to knee bilaterally to help with decongestion and potentially help with healing process of area on the left. Short stretch bandages applied as follows: one 6 cm bandage applied to foot and up  to calf. One 8 cm bandage applied from foot to mid leg. One 10 cm bandage from mid leg to knee. All bandages applied with 50% stretch in a circular wrap pattern. Topper added to keep tape and bandages in place.                OT Education - 07/02/14 1029  Education provided Yes   Education Details Compression wrapping and HEP.   Person(s) Educated Patient;Spouse   Methods Explanation;Demonstration   Comprehension Verbalized understanding;Returned demonstration             OT Long Term Goals - 06/09/14 1015    OT LONG TERM GOAL #1   Title . Patient to complete toileting with minimal assistance of one person in 12 weeks   Baseline mod to max assist, level flucuates daily   Time 12   Period Weeks   Status On-going   OT LONG TERM GOAL #2   Title Patient to complete bed to wheelchair transfer with minimal assist in 12 weeks.   Baseline mod to max assist, level flucuates   Time 12   Period Weeks   Status On-going   OT LONG TERM GOAL #3   Title Optimal edema control will be achieved, patient and caregiver to be independent in wearing of appropriate compression garments in 12 weeks.    Baseline increased edema and requiring compression bandaging to decongest legs.   Time 12   Period Weeks   Status On-going   OT LONG TERM GOAL #4   Title Husband to be independent with donning and doffing of compression daytime and night time garments in 12 weeks   Baseline unable    Time 12   Period Weeks   Status On-going   OT LONG TERM GOAL #5   Title Optimal edema control will be achieved and patient will be measured for and appropriate compression garment in 12 weeks   Baseline not yet decongested to be measured for custom garments   Time 12   Period Weeks   Status On-going   Long Term Additional Goals   Additional Long Term Goals Yes   OT LONG TERM GOAL #6   Title Patient and family to be independent in skincare, HEP and self bandaging in 12 weeks   Baseline minimal cues and  assist, familiar with compression bandaging from the past.   Time 12   Period Weeks   Status On-going   OT LONG TERM GOAL #7   Title  Patient will show a decrease in circumference in affected LE at toes 2cm , foot 3 cm ; ankle and calf 5cm, thighs 5 cm to fit in appropriate compression garments and in shoe in 12 weeks   Baseline see flowsheet for circumferential measurements   Time 12   Period Weeks   Status On-going               Plan - 07/02/14 1030    Clinical Impression Statement Patient made significant gains in bilateral lower extremities since last week, decreasing up to  7 cm in one spot.  This may be due to not having chemo the last couple weeks, or the switch to use of rosidal foam or a combination of both.  She went to the wound care center to follow up with open areas and area on gluteal fold was basically healed and did not require attention.  They treated her left leg and issued pad to place onto area with bandage changes.  Husband applied foam to foot  rather than starting at the ankle therefore the foam stopped short of the knee which did not see as much decongestion.  Added a roll of rosidal to each leg since patient  reports it was more comfortable to have the foam at the foot.  Rosidal applied from foot to knees bilaterally. Will continue to monitor  circumferential  measurements.    Pt will benefit from skilled therapeutic intervention in order to improve on the following deficits (Retired) Difficulty walking;Impaired flexibility;Increased edema;Decreased skin integrity;Decreased strength;Decreased mobility;Decreased balance;Decreased activity tolerance   Rehab Potential Good   Clinical Impairments Affecting Rehab Potential Chemo treatments may resume this week.  Small area on left lower extremity healing and will need to be monitored.   OT Frequency 3x / week   OT Duration 12 weeks   OT Treatment/Interventions Self-care/ADL training;Therapeutic exercise;Functional  Mobility Training;Patient/family education;Manual Therapy;Manual lymph drainage;DME and/or AE instruction;Compression bandaging   Plan Patient may start her chemo treatments back this week and usually has difficulty with coming to Friday appointments.  Since patient is making significant gains, will try to see her 2x next week if she is able to tolerate.   OT Home Exercise Plan Pt has exercises for lower extremity to be performed at home in compression.   Consulted and Agree with Plan of Care Patient;Family member/caregiver   Family Member Consulted spouse- discussed and agreed on POC        Problem List Patient Active Problem List   Diagnosis Date Noted  . Hematoma complicating a procedure 03/06/2014  . Breast cancer 01/10/2014  . SOB (shortness of breath) 03/01/2013  . Bilateral lower extremity edema   . Aortic insufficiency   . Syncope 06/08/2011  . Herpes zoster 06/08/2011  . Multiple sclerosis    Achilles Dunk, OTR/L, CLT 07/02/2014, 10:41 AM  [place] MAIN [facility] SERVICES [address] [place], [place], [address] Phone: [phone]   Fax:  [phone]

## 2014-07-03 ENCOUNTER — Other Ambulatory Visit: Payer: Medicare Other

## 2014-07-03 ENCOUNTER — Ambulatory Visit: Payer: Medicare Other | Admitting: Oncology

## 2014-07-03 ENCOUNTER — Ambulatory Visit: Payer: Medicare Other

## 2014-07-04 ENCOUNTER — Encounter: Payer: Medicare Other | Admitting: Occupational Therapy

## 2014-07-08 ENCOUNTER — Encounter: Payer: Medicare Other | Admitting: Surgery

## 2014-07-08 DIAGNOSIS — L97222 Non-pressure chronic ulcer of left calf with fat layer exposed: Secondary | ICD-10-CM | POA: Diagnosis not present

## 2014-07-08 DIAGNOSIS — L98411 Non-pressure chronic ulcer of buttock limited to breakdown of skin: Secondary | ICD-10-CM | POA: Diagnosis not present

## 2014-07-08 DIAGNOSIS — Q820 Hereditary lymphedema: Secondary | ICD-10-CM | POA: Diagnosis not present

## 2014-07-08 DIAGNOSIS — G35 Multiple sclerosis: Secondary | ICD-10-CM | POA: Diagnosis not present

## 2014-07-08 NOTE — Progress Notes (Signed)
[place]  [phone]) [phone] Fax:(336) [phone]  ID: Rebecca [name] OB: May 24, 1960  MR#: [id number]  FBP#:[id number]  Patient Care Team: Bobetta Lime, MD as PCP - General Bobetta Lime, MD as Referring Physician [name], MD (General Surgery)  CHIEF COMPLAINT:  Chief Complaint  Patient presents with  . Follow-up    breast CA  . Chemotherapy    INTERVAL HISTORY: Patient returns to clinic today for further evaluation and consideration of cycle 5 of 12 of weekly Taxol. She peripheral neuropathy is unchanged. She continues to have significant edema in her bilateral lower extremities. She feels weak and fatigued.  She does not report headache. She denies any chest pain or shortness of breath. She denies any nausea, vomiting, constipation, or diarrhea. She has no urinary complaints. Patient otherwise feels well and offers no further specific complaints.   REVIEW OF SYSTEMS:   Review of Systems  Constitutional: Positive for malaise/fatigue. Negative for fever.  Cardiovascular: Positive for leg swelling.  Neurological: Positive for sensory change and weakness.    As per HPI. Otherwise, a complete review of systems is negatve.  PAST MEDICAL HISTORY: Past Medical History  Diagnosis Date  . Herpes zoster   . Multiple sclerosis 2001    Walks from room to room @ home; but Wheelchair when going out.  . Closed head injury with brief loss of consciousness   . Seizures     Takes Keppra  . IBS (irritable bowel syndrome)     no longer  . Bacterial endocarditis     History of .  Marland Kitchen Aortic valve disease     Mild AS / AI (cannot exclude Bicuspid AoV)  . Syncope and collapse   . Bilateral lower extremity edema     Noncardiac.  Chronic. LE Venous dopplers - negative for DVT.; Echocardiogram January 2016: Normal EF with normal wall motion and valve function. Only grade 1 diastolic dysfunction. EF 60-65%. Mild MR  . Neuromuscular disorder     MS  . Heart  murmur   . Lymphedema     has legs wrapped at [facility]  . Cervical stenosis of spine   . Cancer 12-31-13    Right breast, 12:00, 1.5 cm, T1c,N0 invasive mammary carcinoma, triple negative.  . Breast cancer   . MS (multiple sclerosis)   . Irritable bowel     PAST SURGICAL HISTORY: Past Surgical History  Procedure Laterality Date  . Cholecystectomy    . Ankle surgery      Left  . Port-a-cath removal      right  . Anterior cervical decomp/discectomy fusion  11/17/2011    Procedure: ANTERIOR CERVICAL DECOMPRESSION/DISCECTOMY FUSION 2 LEVELS;  Surgeon: Erline Levine, MD;  Location: [place] NEURO ORS;  Service: Neurosurgery;  Laterality: N/A;  Cervical Five-Six Six-Seven Anterior cervical decompression/diskectomy/fusion  . Transthoracic echocardiogram  03/2013; 02/2014    a) Normal LV size and function with EF 60-65%.; Cannot exclude bicuspid aortic valve with mild AS and mild AI.; b) Normal EF with normal wall motion and valve function x Mild MR. G2 DD. EF 60-65%. Tricuspid AoV  . Lower extremity venous dopplers  Feb 27, 2013    No LE DVT  . Port a cath insertion Right 01/19/2010  . Colonoscopy  2014  . Upper gi endoscopy  2014  . Port-a-cath removal Right 09/03/2013    Procedure: REMOVAL PORT-A-CATH;  Surgeon: Conrad Kihei, MD;  Location: [place];  Service: Vascular;  Laterality: Right;  . Breast biopsy Right  12-31-13    invasive mammary  . Breast surgery Right 02/03/2014    Right simple mastectomy with sentinel node biopsy.  . Mastectomy    . Ankle surgery      FAMILY HISTORY Family History  Problem Relation Age of Onset  . Cancer Father     skin  . Heart disease Father   . Heart attack Father   . Breast cancer Maternal Aunt 60  . Breast cancer Maternal Grandmother 74       ADVANCED DIRECTIVES:    HEALTH MAINTENANCE: History  Substance Use Topics  . Smoking status: Never Smoker   . Smokeless tobacco: Never Used  . Alcohol Use: No     Colonoscopy:  PAP:  Bone  density:  Lipid panel:  Allergies  Allergen Reactions  . Fentanyl Nausea And Vomiting and Nausea Only    vomiting Was given in PACU x3 each time patient got sick  . Sulfa Antibiotics Hives and Other (See Comments)    Light headed, over heated    Current Outpatient Prescriptions  Medication Sig Dispense Refill  . amantadine (SYMMETREL) 100 MG capsule TAKE 3 CAPSULES BY MOUTH EVERY DAY 270 capsule 2  . baclofen (LIORESAL) 10 MG tablet TAKE 2 TABLETS BY MOUTH IN THE MORNING, 1 TABLET AT LUNCH, AND 2 TABLETS AT NIGHT AS DIRECTED 150 tablet 6  . Cholecalciferol (VITAMIN D3) 2000 UNITS capsule Take 2,000 Units by mouth daily.    Marland Kitchen ibuprofen (ADVIL,MOTRIN) 600 MG tablet Take 600 mg by mouth every 6 (six) hours as needed.  0  . levETIRAcetam (KEPPRA) 500 MG tablet Take 1 tablet (500 mg total) by mouth 2 (two) times daily. 60 tablet 12  . lidocaine-prilocaine (EMLA) cream Apply 1 application topically once. Apply to port then cover with saran wrap 1-2 hours before chemotherapy    . loratadine (CLARITIN) 10 MG tablet Take 10 mg by mouth daily.    . Misc Natural Products (LEG VEIN & CIRCULATION) TABS Take by mouth daily.    . Multiple Vitamins-Minerals (MULTIVITAMIN PO) Take 1 tablet by mouth daily.    Marland Kitchen oxybutynin (DITROPAN-XL) 10 MG 24 hr tablet Take 2 tablets (20 mg total) by mouth daily. 180 tablet 1  . oxyCODONE-acetaminophen (PERCOCET/ROXICET) 5-325 MG per tablet Take 1 tablet by mouth every 6 (six) hours as needed. for pain 120 tablet 0  . prochlorperazine (COMPAZINE) 10 MG tablet Take 10 mg by mouth every 6 (six) hours as needed.     Marland Kitchen acetaminophen (TYLENOL) 500 MG tablet Take 1,000 mg by mouth every 6 (six) hours as needed.    . Cranberry 1000 MG CAPS Take by mouth daily.    Marland Kitchen GLUCOSAMINE-CHONDROITIN PO Take 1 tablet by mouth 3 (three) times daily. STRENGTH: 1500-1200    . interferon beta-1a (REBIF REBIDOSE) 44 MCG/0.5ML injection Inject  44 mcg Sub-q 3 times weekly Rotate site after each  injection (Patient not taking: Reported on 05/12/2014) 6 mL 12  . naproxen (NAPROSYN) 250 MG tablet Take 500 mg by mouth as needed for mild pain.     Marland Kitchen prochlorperazine (COMPAZINE) 25 MG suppository Place 25 mg rectally every 12 (twelve) hours as needed for nausea or vomiting.     No current facility-administered medications for this visit.    OBJECTIVE: Filed Vitals:   06/19/14 1058  BP: 116/72  Pulse: 77  Temp: 96.2 F (35.7 C)  Resp: 18     There is no weight on file to calculate BMI.    ECOG  FS:1 - Symptomatic but completely ambulatory  General: Well-developed, well-nourished, no acute distress. Sitting in a wheelchair. Eyes: anicteric sclera. Lungs: Clear to auscultation bilaterally. Heart: Regular rate and rhythm. No rubs, murmurs, or gallops. Abdomen: Soft, nontender, nondistended. No organomegaly noted, normoactive bowel sounds. Musculoskeletal: Bilateral lower extremity lymphedema. Neuro: Alert, answering all questions appropriately. Cranial nerves grossly intact. Skin: No rashes or petechiae noted. Psych: Normal affect.   LAB RESULTS:  Lab Results  Component Value Date   NA 136 06/19/2014   K 3.7 06/19/2014   CL 104 06/19/2014   CO2 28 06/19/2014   GLUCOSE 114* 06/19/2014   BUN 15 06/19/2014   CREATININE 0.41* 06/19/2014   CALCIUM 8.9 06/19/2014   PROT 7.0 06/19/2014   ALBUMIN 4.0 06/19/2014   AST 22 06/19/2014   ALT 18 06/19/2014   ALKPHOS 70 06/19/2014   BILITOT 0.4 06/19/2014   GFRNONAA >60 06/19/2014   GFRAA >60 06/19/2014    Lab Results  Component Value Date   WBC 3.2* 06/19/2014   NEUTROABS 2.1 06/19/2014   HGB 9.8* 06/19/2014   HCT 29.6* 06/19/2014   MCV 93.7 06/19/2014   PLT 152 06/19/2014     STUDIES: No results found.  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 BRCA negative.  PLAN:    1. Breast cancer: Because patient had a full mastectomy, she will not require adjuvant XRT. Given the fact that she is  a triple negative cancer, she will benefit from adjuvant chemotherapy. Given her underlying history of multiple sclerosis, will coordinate treatments with her primary neurologist. Patient does not require an aromatase inhibitor. Pretreatment MUGA revealed an EF of 67%.  Patient has now completed 4 cycles of Adriamycin and Cytoxan. Proceed with cycle 5 of 12 of weekly Taxol, which has been dose reduced 10% secondary to her neuropathy.  Patient is requesting a next her week off prior to next treatment, therefore she will return to clinic in 2 weeks for consideration of cycle 6.  2. Multiple sclerosis: Patient is currently receiving treatment with "Rebif-rebidose" which will be on hold temporarily during chemotherapy. 3. Lymphedema: Continue treatment and wraps as prescribed.  4. Peripheral neuropathy: Dose reduce Taxol as above.  Her primary neurologist is Dr. Andrey Spearman.  phone [phone], fax [phone].   Patient expressed understanding and was in agreement with this plan. She also understands that She can call clinic at any time with any questions, concerns, or complaints.   Breast cancer   Staging form: Breast, AJCC 7th Edition     Pathologic stage from 05/24/2014: Stage IA (T1c, N0, cM0) - Signed by Lloyd Huger, MD on 05/24/2014   Lloyd Huger, MD   07/08/2014 6:04 PM
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
## 2014-07-09 ENCOUNTER — Other Ambulatory Visit: Payer: Self-pay | Admitting: Oncology

## 2014-07-09 ENCOUNTER — Ambulatory Visit: Payer: Medicare Other | Attending: Family Medicine | Admitting: Occupational Therapy

## 2014-07-09 ENCOUNTER — Encounter: Payer: Self-pay | Admitting: Occupational Therapy

## 2014-07-09 DIAGNOSIS — I890 Lymphedema, not elsewhere classified: Secondary | ICD-10-CM | POA: Insufficient documentation

## 2014-07-09 NOTE — Therapy (Signed)
[place] MAIN [facility] SERVICES [address] [place], [place], [address] Phone: [phone]   Fax:  [phone]  Occupational Therapy Treatment  Patient Details  Name: Rebecca [name] MRN: [id number] Date of Birth: December 05, 1960 Referring Provider:  Bobetta Lime, MD  Encounter Date: 07/09/2014      OT End of Session - 07/09/14 1414    Visit Number 7   Number of Visits 36   Date for OT Re-Evaluation 07/23/14   Authorization Type Medicare G code visit 7   Authorization Time Period of 10   OT Start Time 1975   OT Stop Time 0956   OT Time Calculation (min) 59 min   Activity Tolerance Patient tolerated treatment well;No increased pain   Behavior During Therapy [facility] for tasks assessed/performed      Past Medical History  Diagnosis Date  . Herpes zoster   . Multiple sclerosis 2001    Walks from room to room @ home; but Wheelchair when going out.  . Closed head injury with brief loss of consciousness   . Seizures     Takes Keppra  . IBS (irritable bowel syndrome)     no longer  . Bacterial endocarditis     History of .  Marland Kitchen Aortic valve disease     Mild AS / AI (cannot exclude Bicuspid AoV)  . Syncope and collapse   . Bilateral lower extremity edema     Noncardiac.  Chronic. LE Venous dopplers - negative for DVT.; Echocardiogram January 2016: Normal EF with normal wall motion and valve function. Only grade 1 diastolic dysfunction. EF 60-65%. Mild MR  . Neuromuscular disorder     MS  . Heart murmur   . Lymphedema     has legs wrapped at [facility]  . Cervical stenosis of spine   . Cancer 12-31-13    Right breast, 12:00, 1.5 cm, T1c,N0 invasive mammary carcinoma, triple negative.  . Breast cancer   . MS (multiple sclerosis)   . Irritable bowel     Past Surgical History  Procedure Laterality Date  . Cholecystectomy    . Ankle surgery      Left  . Port-a-cath removal      right  . Anterior cervical decomp/discectomy fusion  11/17/2011    Procedure: ANTERIOR CERVICAL DECOMPRESSION/DISCECTOMY FUSION 2 LEVELS;  Surgeon: Erline Levine, MD;  Location: [place] [facility];  Service: Neurosurgery;  Laterality: N/A;  Cervical Five-Six Six-Seven Anterior cervical decompression/diskectomy/fusion  . Transthoracic echocardiogram  03/2013; 02/2014    a) Normal LV size and function with EF 60-65%.; Cannot exclude bicuspid aortic valve with mild AS and mild AI.; b) Normal EF with normal wall motion and valve function x Mild MR. G2 DD. EF 60-65%. Tricuspid AoV  . Lower extremity venous dopplers  Feb 27, 2013    No LE DVT  . Port a cath insertion Right 01/19/2010  . Colonoscopy  2014  . Upper gi endoscopy  2014  . Port-a-cath removal Right 09/03/2013    Procedure: REMOVAL PORT-A-CATH;  Surgeon: Conrad Bowling [place], MD;  Location: [place];  Service: Vascular;  Laterality: Right;  . Breast biopsy Right 12-31-13    invasive mammary  . Breast surgery Right 02/03/2014    Right simple mastectomy with sentinel node biopsy.  . Mastectomy    . Ankle surgery      There were no vitals filed for this visit.  Visit Diagnosis:  Lymphedema      Subjective Assessment - 07/09/14 1410  Subjective  Patient reports she went back to the wound care center and her open areas are resolved now, she will not need to go back.  She still has not had any more chemo and will not for a couple more weeks.    Patient is accompained by: Family member   Pertinent History Patient has been feeling better and more energy since not having chemo the last few weeks, legs are responding more now to compression and she had quick healing with open areas.    Limitations Patient may have to go back to receiving chemo after a couple more weeks.   Patient Stated Goals Patient wants to get her legs back down to a smaller size, be able to transfer and walk better as well as be able to help more with her self care tasks, especially toileting.   Currently in Pain? No/denies   Pain Score 0-No pain    Multiple Pain Sites No             LYMPHEDEMA/ONCOLOGY QUESTIONNAIRE - 07/09/14 0912    Right Lower Extremity Lymphedema   30 cm Proximal to Floor at Lateral Plantar Foot 54.2 cm   20 cm Proximal to Floor at Lateral Plantar Foot 44.$RemoveBeforeD'6 1   10 'xbquUrPqTcOOoL$ cm Proximal to Floor at Lateral Malleoli 28.2 cm   5 cm Proximal to 1st MTP Joint 23.7 cm   Across MTP Joint 25.8 cm   Around Proximal Great Toe 9.8 cm   Other 34.2   Left Lower Extremity Lymphedema   30 cm Proximal to Floor at Lateral Plantar Foot 45.2 cm   20 cm Proximal to Floor at Lateral Plantar Foot 35.5 cm   10 cm Proximal to Floor at Lateral Malleoli 25.3 cm   5 cm Proximal to 1st MTP Joint 22.5 cm   Across MTP Joint 23.9 cm   Around Proximal Great Toe 9.8 cm   Other 32.0                 OT Treatments/Exercises (OP) - 07/09/14 0001    Transfers   Transfers Sit to Stand   Sit to Stand 3: Mod assist;4: Min assist   Manual Therapy   Edema Management Patient arrived with bandages intact on bilateral lower extremities from foot to knee. Therapist removed bandages and performed skin inspection. Patient did not have any open areas noted on her bilateral open extremities.  Circumferential measurements taken and recorded on flowsheet. Patient seen for Skin care of bilateral lower extremities followed with application of Eucerin lotion from foot to knee bilaterally. Stockinette applied from bilateral foot to knee. Artiflex 10cm to foot and ankle.  Rosidal foam from foot to mid calf and then another roll from ankle to knee bilaterally. Short stretch bandages applied as follows: one 6 cm bandage applied to foot and up to calf. One 8 cm bandage applied from foot to mid leg. One 10 cm bandage from mid leg to knee. All bandages applied with 50% stretch in a circular wrap pattern. Topper added to keep tape and bandages in place.                OT Education - 07/09/14 1413    Education provided Yes   Education Details Continued  focus on compression wrapping and HEP, recommended daily compression wrapping changes for right leg now since it has been slower to respond.  Husband has been wrapping left leg more frequently secondary to wound, which has had a great response.  Person(s) Educated Patient;Spouse   Methods Explanation;Demonstration;Verbal cues   Comprehension Verbalized understanding;Returned demonstration;Verbal cues required             OT Long Term Goals - 07/09/14 1417    OT LONG TERM GOAL #1   Title . Patient to complete toileting with minimal assistance of one person in 12 weeks   Baseline mod to max assist, level flucuates daily   Time 12   Period Weeks   Status Partially Met   OT LONG TERM GOAL #2   Title Patient to complete bed to wheelchair transfer with minimal assist in 12 weeks.   Baseline mod to max assist, level flucuates   Time 12   Period Weeks   Status Partially Met   OT LONG TERM GOAL #3   Title Optimal edema control will be achieved, patient and caregiver to be independent in wearing of appropriate compression garments in 12 weeks.    Baseline increased edema and requiring compression bandaging to decongest legs.   Time 12   Period Weeks   Status On-going   OT LONG TERM GOAL #5   Title Optimal edema control will be achieved and patient will be measured for and appropriate compression garment in 12 weeks   Baseline not yet decongested to be measured for custom garments   Time 12   Period Weeks   Status On-going   OT LONG TERM GOAL #6   Title Patient and family to be independent in skincare, HEP and self bandaging in 12 weeks   Baseline minimal cues and assist, familiar with compression bandaging from the past.   Time 12   Period Weeks   Status Partially Met   OT LONG TERM GOAL #7   Title  Patient will show a decrease in circumference in affected LE at toes 2cm , foot 3 cm ; ankle and calf 5cm, thighs 5 cm to fit in appropriate compression garments and in shoe in 12  weeks   Baseline see flowsheet for circumferential measurements   Time 12   Period Weeks   Status On-going               Plan - 07/09/14 1415    Clinical Impression Statement Patient's open areas on left leg and gluteal fold now healed and now longer requires wound care center.  She made excellent gains with decongestion of left LE by compression wrapping daily or at least every other day.  The right leg was rewrapped less frequently and did not progress as much.  Recommend husband work towards wrapping more often to see if we can get increased results especially now she is not taking chemo for the few weeks.  Continue OT for comprehensive lymphedema program to focus on decongestion of the bilateral lower extremities to prepare the legs for custom compression garments.    Pt will benefit from skilled therapeutic intervention in order to improve on the following deficits (Retired) Difficulty walking;Impaired flexibility;Increased edema;Decreased skin integrity;Decreased strength;Decreased mobility;Decreased balance;Decreased activity tolerance   Rehab Potential Good   Clinical Impairments Affecting Rehab Potential Patient currently on hold for chemo treatments but may need to start back in the next 1-2 weeks. Open areas on leg and gluteal fold now healed.   OT Frequency 3x / week   OT Duration 12 weeks   OT Treatment/Interventions Self-care/ADL training;Therapeutic exercise;Functional Mobility Training;Patient/family education;Manual Therapy;Manual lymph drainage;DME and/or AE instruction;Compression bandaging   Plan recommend more frequent compression bandaging changes on the right leg now since it  has been slower to respond with less frequent compression rewrapping.   OT Home Exercise Plan Pt has exercises for lower extremity to be performed at home in compression.   Consulted and Agree with Plan of Care Patient;Family member/caregiver   Family Member Consulted spouse- discussed and agreed  on POC        Problem List Patient Active Problem List   Diagnosis Date Noted  . Hematoma complicating a procedure 03/06/2014  . Breast cancer 01/10/2014  . SOB (shortness of breath) 03/01/2013  . Bilateral lower extremity edema   . Aortic insufficiency   . Syncope 06/08/2011  . Herpes zoster 06/08/2011  . Multiple sclerosis    Amy Oneita Jolly, OTR/L, CLT  07/09/2014, 2:35 PM  [place] MAIN [facility] SERVICES [address] [place], [place], [address] Phone: [phone]   Fax:  [phone]
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
## 2014-07-11 ENCOUNTER — Encounter: Payer: Medicare Other | Admitting: Occupational Therapy

## 2014-07-14 ENCOUNTER — Ambulatory Visit: Payer: Medicare Other | Admitting: Occupational Therapy

## 2014-07-16 ENCOUNTER — Encounter: Payer: Medicare Other | Admitting: Occupational Therapy

## 2014-07-17 ENCOUNTER — Inpatient Hospital Stay: Payer: Medicare Other

## 2014-07-17 ENCOUNTER — Inpatient Hospital Stay (HOSPITAL_BASED_OUTPATIENT_CLINIC_OR_DEPARTMENT_OTHER): Payer: Medicare Other | Admitting: Oncology

## 2014-07-17 ENCOUNTER — Inpatient Hospital Stay: Payer: Medicare Other | Attending: Oncology

## 2014-07-17 VITALS — BP 112/74 | HR 72

## 2014-07-17 VITALS — BP 132/83 | HR 71 | Temp 97.5°F | Resp 18

## 2014-07-17 DIAGNOSIS — Z171 Estrogen receptor negative status [ER-]: Secondary | ICD-10-CM | POA: Insufficient documentation

## 2014-07-17 DIAGNOSIS — I890 Lymphedema, not elsewhere classified: Secondary | ICD-10-CM | POA: Diagnosis not present

## 2014-07-17 DIAGNOSIS — G629 Polyneuropathy, unspecified: Secondary | ICD-10-CM | POA: Diagnosis not present

## 2014-07-17 DIAGNOSIS — G35 Multiple sclerosis: Secondary | ICD-10-CM | POA: Diagnosis not present

## 2014-07-17 DIAGNOSIS — Z79899 Other long term (current) drug therapy: Secondary | ICD-10-CM | POA: Diagnosis not present

## 2014-07-17 DIAGNOSIS — C50911 Malignant neoplasm of unspecified site of right female breast: Secondary | ICD-10-CM

## 2014-07-17 DIAGNOSIS — Z5111 Encounter for antineoplastic chemotherapy: Secondary | ICD-10-CM | POA: Diagnosis not present

## 2014-07-17 LAB — CBC WITH DIFFERENTIAL/PLATELET
BASOS ABS: 0 10*3/uL (ref 0–0.1)
Basophils Relative: 1 %
EOS PCT: 4 %
Eosinophils Absolute: 0.1 10*3/uL (ref 0–0.7)
HCT: 35 % (ref 35.0–47.0)
Hemoglobin: 11.5 g/dL — ABNORMAL LOW (ref 12.0–16.0)
LYMPHS ABS: 0.8 10*3/uL — AB (ref 1.0–3.6)
LYMPHS PCT: 25 %
MCH: 30.5 pg (ref 26.0–34.0)
MCHC: 32.9 g/dL (ref 32.0–36.0)
MCV: 92.7 fL (ref 80.0–100.0)
MONOS PCT: 11 %
Monocytes Absolute: 0.4 10*3/uL (ref 0.2–0.9)
NEUTROS ABS: 2 10*3/uL (ref 1.4–6.5)
Neutrophils Relative %: 59 %
PLATELETS: 130 10*3/uL — AB (ref 150–440)
RBC: 3.78 MIL/uL — ABNORMAL LOW (ref 3.80–5.20)
RDW: 13.3 % (ref 11.5–14.5)
WBC: 3.3 10*3/uL — ABNORMAL LOW (ref 3.6–11.0)

## 2014-07-17 LAB — COMPREHENSIVE METABOLIC PANEL
ALBUMIN: 4.5 g/dL (ref 3.5–5.0)
ALK PHOS: 73 U/L (ref 38–126)
ALT: 16 U/L (ref 14–54)
AST: 19 U/L (ref 15–41)
Anion gap: 4 — ABNORMAL LOW (ref 5–15)
BUN: 21 mg/dL — ABNORMAL HIGH (ref 6–20)
CALCIUM: 9.1 mg/dL (ref 8.9–10.3)
CO2: 28 mmol/L (ref 22–32)
CREATININE: 0.46 mg/dL (ref 0.44–1.00)
Chloride: 104 mmol/L (ref 101–111)
GFR calc Af Amer: >60 mL/min (ref >60)
GLUCOSE: 93 mg/dL (ref 65–99)
POTASSIUM: 3.6 mmol/L (ref 3.5–5.1)
Sodium: 136 mmol/L (ref 135–145)
Total Bilirubin: 0.5 mg/dL (ref 0.3–1.2)
Total Protein: 7.4 g/dL (ref 6.5–8.1)

## 2014-07-17 MED ORDER — SODIUM CHLORIDE 0.9 % IV SOLN
Freq: Once | INTRAVENOUS | Status: AC
  Administered 2014-07-17: 12:00:00 via INTRAVENOUS
  Filled 2014-07-17: qty 1000

## 2014-07-17 MED ORDER — DIPHENHYDRAMINE HCL 50 MG/ML IJ SOLN
25.0000 mg | Freq: Once | INTRAMUSCULAR | Status: AC
  Administered 2014-07-17: 25 mg via INTRAVENOUS
  Filled 2014-07-17: qty 1

## 2014-07-17 MED ORDER — DEXTROSE 5 % IV SOLN
72.0000 mg/m2 | Freq: Once | INTRAVENOUS | Status: AC
  Administered 2014-07-17: 150 mg via INTRAVENOUS
  Filled 2014-07-17: qty 25

## 2014-07-17 MED ORDER — SODIUM CHLORIDE 0.9 % IV SOLN
Freq: Once | INTRAVENOUS | Status: AC
  Administered 2014-07-17: 12:00:00 via INTRAVENOUS
  Filled 2014-07-17: qty 4

## 2014-07-17 MED ORDER — HEPARIN SOD (PORK) LOCK FLUSH 100 UNIT/ML IV SOLN
500.0000 [IU] | Freq: Once | INTRAVENOUS | Status: AC | PRN
  Administered 2014-07-17: 500 [IU]
  Filled 2014-07-17: qty 5

## 2014-07-17 MED ORDER — FAMOTIDINE IN NACL 20-0.9 MG/50ML-% IV SOLN
20.0000 mg | Freq: Once | INTRAVENOUS | Status: AC
Start: 2014-07-17 — End: 2014-07-17
  Administered 2014-07-17: 20 mg via INTRAVENOUS
  Filled 2014-07-17: qty 50

## 2014-07-18 ENCOUNTER — Telehealth: Payer: Self-pay | Admitting: *Deleted

## 2014-07-18 NOTE — Telephone Encounter (Signed)
Per pt she was told to call and let us know if she was having any pain in her legs. Pt states she's having a lot of pain and can not hardly walk. Would like for someone to return her call...[phone]   Spoke with patient regarding leg pain, increased difficulty walking. Patient states she did not have pain until after treatment yesterday, advised patient to take pain medication as prescribed, Dr. Grayland Ormond notified and patient to keep follow up for next week. I will follow up with patient Monday regarding symptoms.

## 2014-07-21 ENCOUNTER — Other Ambulatory Visit: Payer: Self-pay | Admitting: *Deleted

## 2014-07-21 ENCOUNTER — Ambulatory Visit: Payer: Medicare Other | Admitting: Oncology

## 2014-07-21 ENCOUNTER — Encounter: Payer: Medicare Other | Admitting: Occupational Therapy

## 2014-07-21 MED ORDER — OXYCODONE HCL 10 MG PO TABS: 10.0000 mg | ORAL_TABLET | Freq: Four times a day (QID) | ORAL | Status: DC | PRN

## 2014-07-22 ENCOUNTER — Other Ambulatory Visit: Payer: Self-pay | Admitting: Family Medicine

## 2014-07-22 DIAGNOSIS — M8949 Other hypertrophic osteoarthropathy, multiple sites: Secondary | ICD-10-CM

## 2014-07-22 DIAGNOSIS — M150 Primary generalized (osteo)arthritis: Principal | ICD-10-CM

## 2014-07-22 DIAGNOSIS — M159 Polyosteoarthritis, unspecified: Secondary | ICD-10-CM

## 2014-07-23 ENCOUNTER — Encounter: Payer: Medicare Other | Admitting: Occupational Therapy

## 2014-07-24 ENCOUNTER — Ambulatory Visit: Payer: Medicare Other | Admitting: Oncology

## 2014-07-24 ENCOUNTER — Other Ambulatory Visit: Payer: Medicare Other

## 2014-07-24 ENCOUNTER — Ambulatory Visit: Payer: Medicare Other

## 2014-07-27 ENCOUNTER — Other Ambulatory Visit: Payer: Self-pay | Admitting: Family Medicine

## 2014-07-27 DIAGNOSIS — M2550 Pain in unspecified joint: Secondary | ICD-10-CM

## 2014-07-28 ENCOUNTER — Ambulatory Visit: Payer: Medicare Other | Admitting: Occupational Therapy

## 2014-07-28 DIAGNOSIS — I890 Lymphedema, not elsewhere classified: Secondary | ICD-10-CM | POA: Diagnosis not present

## 2014-07-29 ENCOUNTER — Encounter: Payer: Self-pay | Admitting: Occupational Therapy

## 2014-07-29 NOTE — Therapy (Signed)
[facility] MAIN [facility] SERVICES [address] [place], [place], [address] Phone: [phone]   Fax:  [phone]  Occupational Therapy Treatment  Patient Details  Name: Rebecca [name] MRN: [id number] Date of Birth: 1960/07/26 Referring Provider:  Edwena Felty, MD  Encounter Date: 07/28/2014      OT End of Session - 07/29/14 1457    Visit Number 8   Number of Visits 36   Date for OT Re-Evaluation 07/23/14   Authorization Type Medicare G code visit 8   Authorization Time Period of 10   OT Start Time 1400   OT Stop Time 1510   OT Time Calculation (min) 70 min   Activity Tolerance Patient tolerated treatment well;No increased pain   Behavior During Therapy [facility] for tasks assessed/performed      Past Medical History  Diagnosis Date  . Herpes zoster   . Multiple sclerosis 2001    Walks from room to room @ home; but Wheelchair when going out.  . Closed head injury with brief loss of consciousness   . Seizures     Takes Keppra  . IBS (irritable bowel syndrome)     no longer  . Bacterial endocarditis     History of .  Marland Kitchen Aortic valve disease     Mild AS / AI (cannot exclude Bicuspid AoV)  . Syncope and collapse   . Bilateral lower extremity edema     Noncardiac.  Chronic. LE Venous dopplers - negative for DVT.; Echocardiogram January 2016: Normal EF with normal wall motion and valve function. Only grade 1 diastolic dysfunction. EF 60-65%. Mild MR  . Neuromuscular disorder     MS  . Heart murmur   . Lymphedema     has legs wrapped at [facility]  . Cervical stenosis of spine   . Cancer 12-31-13    Right breast, 12:00, 1.5 cm, T1c,N0 invasive mammary carcinoma, triple negative.  . Breast cancer   . MS (multiple sclerosis)   . Irritable bowel     Past Surgical History  Procedure Laterality Date  . Cholecystectomy    . Ankle surgery      Left  . Port-a-cath removal      right  . Anterior cervical decomp/discectomy fusion  11/17/2011     Procedure: ANTERIOR CERVICAL DECOMPRESSION/DISCECTOMY FUSION 2 LEVELS;  Surgeon: Maeola Harman, MD;  Location: [facility]URO ORS;  Service: Neurosurgery;  Laterality: N/A;  Cervical Five-Six Six-Seven Anterior cervical decompression/diskectomy/fusion  . Transthoracic echocardiogram  03/2013; 02/2014    a) Normal LV size and function with EF 60-65%.; Cannot exclude bicuspid aortic valve with mild AS and mild AI.; b) Normal EF with normal wall motion and valve function x Mild MR. G2 DD. EF 60-65%. Tricuspid AoV  . Lower extremity venous dopplers  Feb 27, 2013    No LE DVT  . Port a cath insertion Right 01/19/2010  . Colonoscopy  2014  . Upper gi endoscopy  2014  . Port-a-cath removal Right 09/03/2013    Procedure: REMOVAL PORT-A-CATH;  Surgeon: Fransisco Hertz, MD;  Location: [facility] OR;  Service: Vascular;  Laterality: Right;  . Breast biopsy Right 12-31-13    invasive mammary  . Breast surgery Right 02/03/2014    Right simple mastectomy with sentinel node biopsy.  . Mastectomy    . Ankle surgery      There were no vitals filed for this visit.  Visit Diagnosis:  Lymphedema - Plan: Ot plan of care cert/re-cert  Subjective Assessment - 07/28/14 1450    Subjective  Patient reports she has had car trouble and was not able to come to therapy.  She reports she also had increased edema and her oncologist decided to stop chemo treatments at this time.  She may be going back to taking her MS medication soon, she is looking forward to taking it since she feels it helps to manage her symptoms of spasticity.   Patient is accompained by: Family member   Pertinent History Patient had been taking chemo in the past weeks.   Patient Stated Goals Patient wants to get her legs back down to a smaller size, be able to transfer and walk better as well as be able to help more with her self care tasks, especially toileting.   Currently in Pain? No/denies   Pain Score 0-No pain             LYMPHEDEMA/ONCOLOGY  QUESTIONNAIRE - 07/28/14 1419    Right Lower Extremity Lymphedema   30 cm Proximal to Floor at Lateral Plantar Foot 48.6 cm   20 cm Proximal to Floor at Lateral Plantar Foot 39.$RemoveBeforeD'8 1   10 'BraOSxCglPGTsc$ cm Proximal to Floor at Lateral Malleoli 26.5 cm   5 cm Proximal to 1st MTP Joint 22.5 cm   Across MTP Joint 25 cm   Around Proximal Great Toe 10 cm   Other 32.2   Left Lower Extremity Lymphedema   30 cm Proximal to Floor at Lateral Plantar Foot 44.3 cm   20 cm Proximal to Floor at Lateral Plantar Foot 33.8 cm   10 cm Proximal to Floor at Lateral Malleoli 25.2 cm   5 cm Proximal to 1st MTP Joint 23.2 cm   Across MTP Joint 25.1 cm   Around Proximal Great Toe 10.4 cm   Other 31.7                 OT Treatments/Exercises (OP) - 07/29/14 0001    Manual Therapy   Edema Management Patient arrived with bandages intact on bilateral lower extremities from foot to knee. Therapist removed bandages and performed skin inspection. Patient did not have any open areas noted on her bilateral open extremities.  Circumferential measurements taken and recorded on flowsheet. Patient seen for Skin care of bilateral lower extremities followed with application of Eucerin lotion from foot to knee bilaterally. Stockinette applied from bilateral foot to knee. Artiflex 10cm to foot and ankle.  Rosidal foam from foot to mid calf and then another roll from ankle to knee bilaterally. Short stretch bandages applied as follows: one 6 cm bandage applied to foot and up to calf. One 8 cm bandage applied from foot to mid leg. One 10 cm bandage from mid leg to knee. All bandages applied with 50% stretch in a circular wrap pattern. Topper added to keep tape and bandages in place.                OT Education - 07/28/14 1455    Education provided Yes   Education Details Compression bandaging, home management, HEP with instructions to husband, continued daily to every other day wrapping.   Person(s) Educated Patient;Spouse    Methods Explanation;Demonstration;Verbal cues   Comprehension Verbal cues required;Returned demonstration;Verbalized understanding             OT Long Term Goals - 07/28/14 1505    OT LONG TERM GOAL #1   Title . Patient to complete toileting with minimal assistance of one person in 12 weeks  Baseline min to mod assist, level flucuates daily   Time 12   Period Weeks   Status Partially Met   OT LONG TERM GOAL #2   Title Patient to complete bed to wheelchair transfer with minimal assist in 12 weeks.   Baseline mod to max assist, level flucuates   Time 12   Period Weeks   Status Partially Met   OT LONG TERM GOAL #3   Title Optimal edema control will be achieved, patient and caregiver to be independent in wearing of appropriate compression garments in 12 weeks.    Baseline increased edema and requiring compression bandaging to decongest legs.   Time 12   Period Weeks   Status On-going   OT LONG TERM GOAL #4   Title Husband to be independent with donning and doffing of compression daytime and night time garments in 12 weeks   Baseline unable    Time 12   Period Weeks   Status On-going   OT LONG TERM GOAL #5   Title Optimal edema control will be achieved and patient will be measured for and appropriate compression garment in 12 weeks   Baseline not yet decongested to be measured for custom garments   Time 12   Period Weeks   Status On-going   OT LONG TERM GOAL #6   Title Patient and family to be independent in skincare, HEP and self bandaging in 12 weeks   Baseline minimal cues and assist, familiar with compression bandaging from the past.   Time 12   Period Weeks   Status Partially Met   OT LONG TERM GOAL #7   Title  Patient will show a decrease in circumference in affected LE at toes 2cm , foot 3 cm ; ankle and calf 5cm, thighs 5 cm to fit in appropriate compression garments and in shoe in 12 weeks   Baseline see flowsheet for circumferential measurements   Time 12    Period Weeks   Status On-going               Plan - 07/28/14 1520    Clinical Impression Statement Patient continues to demo increased edema in bilateral lower extremities but has shown significant improvements since not taking chemo treatments. She had a couple of open wounds which have now healed but remains at risk for wounds and infections.  She has shown significant changes in decongestion of the lower extremities below the knee and would benefit from continued focus on these areas in addition to adding compression to thighs if she is able to tolerate.  She has had difficulty in the past with thigh high compression due to immobility and difficulty managing for toileting.  She has not yet gotten back to being able to wear her custom compression garments for daytime which is largely in part due to side effects of chemo treatments for breast cancer.  Patient continues to demo decreased ability to perform self care tasks and is extremely limited with her transfers and mobility at this time and would benefit from consistenlty coming to therapy 2-3 x a week to focus on further decongestion of the lower extremities and prepare the legs for custom garment wear and to improve her overall ADL and functional mobility status. She has not been able to be consistent with attendance due to chemo treatments/side effects, transportation issues and wound issues.  Goals were updated to reflect patients current progress.    Pt will benefit from skilled therapeutic intervention in order to improve  on the following deficits (Retired) Difficulty walking;Impaired flexibility;Increased edema;Decreased skin integrity;Decreased strength;Decreased mobility;Decreased balance;Decreased activity tolerance   Rehab Potential Good   OT Frequency 3x / week   OT Duration 12 weeks   OT Treatment/Interventions Self-care/ADL training;Therapeutic exercise;Functional Mobility Training;Patient/family education;Manual Therapy;Manual  lymph drainage;DME and/or AE instruction;Compression bandaging   OT Home Exercise Plan Pt has exercises for lower extremity to be performed at home in compression.   Consulted and Agree with Plan of Care Patient;Family member/caregiver   Family Member Consulted spouse- discussed and agreed on POC        Problem List Patient Active Problem List   Diagnosis Date Noted  . Hematoma complicating a procedure 03/06/2014  . Breast cancer 01/10/2014  . SOB (shortness of breath) 03/01/2013  . Bilateral lower extremity edema   . Aortic insufficiency   . Syncope 06/08/2011  . Herpes zoster 06/08/2011  . Multiple sclerosis    Deasia Chiu Oneita Jolly, OTR/L, CLT Latesha Chesney 07/29/2014, 3:18 PM  [place] MAIN [facility] SERVICES [address] [place], [place], [address] Phone: [phone]   Fax:  [phone]

## 2014-07-30 ENCOUNTER — Ambulatory Visit: Payer: Medicare Other | Admitting: Occupational Therapy

## 2014-08-05 ENCOUNTER — Encounter: Payer: Medicare Other | Admitting: Occupational Therapy

## 2014-08-07 ENCOUNTER — Other Ambulatory Visit: Payer: Self-pay | Admitting: *Deleted

## 2014-08-07 ENCOUNTER — Other Ambulatory Visit: Payer: Self-pay | Admitting: Oncology

## 2014-08-07 DIAGNOSIS — C50919 Malignant neoplasm of unspecified site of unspecified female breast: Secondary | ICD-10-CM

## 2014-08-07 MED ORDER — OXYCODONE HCL 10 MG PO TABS
10.0000 mg | ORAL_TABLET | Freq: Four times a day (QID) | ORAL | Status: DC | PRN
Start: 2014-08-07 — End: 2014-09-11

## 2014-08-07 NOTE — Telephone Encounter (Signed)
Informed that prescription is ready to pick up  

## 2014-08-08 ENCOUNTER — Encounter: Payer: Self-pay | Admitting: *Deleted

## 2014-08-08 ENCOUNTER — Encounter: Payer: Medicare Other | Admitting: Occupational Therapy

## 2014-08-10 NOTE — Progress Notes (Signed)
Rebecca [name]  [phone] [phone]  ID: Rebecca [name] OB: August 21, 1960  MR#: [id number]  TDD#:[id number]  Patient Care Team: Bobetta Lime, MD as PCP - General Bobetta Lime, MD as Referring Physician [name], MD (General Surgery)  CHIEF COMPLAINT:  Chief Complaint  Patient presents with  . Follow-up    breast cancer    INTERVAL HISTORY: Patient returns to clinic today for further evaluation and consideration of cycle 6 of 12 of weekly Taxol. She has not received treatment in approximately 3-4 weeks secondary to leg pain and decreased performance status. This is improved and she is now nearly back to her baseline. She peripheral neuropathy is unchanged. She continues to have significant edema in her bilateral lower extremities. She does not report headache. She denies any chest pain or shortness of breath. She denies any nausea, vomiting, constipation, or diarrhea. She has no urinary complaints. Patient offers no further specific complaints.   REVIEW OF SYSTEMS:   Review of Systems  Constitutional: Positive for malaise/fatigue. Negative for fever.  Cardiovascular: Positive for leg swelling.  Neurological: Positive for sensory change and weakness.    As per HPI. Otherwise, a complete review of systems is negatve.  PAST MEDICAL HISTORY: Past Medical History  Diagnosis Date  . Herpes zoster   . Multiple sclerosis 2001    Walks from room to room @ home; but Wheelchair when going out.  . Closed head injury with brief loss of consciousness   . Seizures     Takes Keppra  . IBS (irritable bowel syndrome)     no longer  . Bacterial endocarditis     History of .  Marland Kitchen Aortic valve disease     Mild AS / AI (cannot exclude Bicuspid AoV)  . Syncope and collapse   . Bilateral lower extremity edema     Noncardiac.  Chronic. LE Venous dopplers - negative for DVT.; Echocardiogram January 2016: Normal EF with normal wall motion and valve  function. Only grade 1 diastolic dysfunction. EF 60-65%. Mild MR  . Neuromuscular disorder     MS  . Heart murmur   . Lymphedema     has legs wrapped at [facility]  . Cervical stenosis of spine   . Cancer 12-31-13    Right breast, 12:00, 1.5 cm, T1c,N0 invasive mammary carcinoma, triple negative.  . Breast cancer   . MS (multiple sclerosis)   . Irritable bowel     PAST SURGICAL HISTORY: Past Surgical History  Procedure Laterality Date  . Cholecystectomy    . Ankle surgery      Left  . Port-a-cath removal      right  . Anterior cervical decomp/discectomy fusion  11/17/2011    Procedure: ANTERIOR CERVICAL DECOMPRESSION/DISCECTOMY FUSION 2 LEVELS;  Surgeon: Erline Levine, MD;  Location: [place] [facility] NEURO ORS;  Service: Neurosurgery;  Laterality: N/A;  Cervical Five-Six Six-Seven Anterior cervical decompression/diskectomy/fusion  . Transthoracic echocardiogram  03/2013; 02/2014    a) Normal LV size and function with EF 60-65%.; Cannot exclude bicuspid aortic valve with mild AS and mild AI.; b) Normal EF with normal wall motion and valve function x Mild MR. G2 DD. EF 60-65%. Tricuspid AoV  . Lower extremity venous dopplers  Feb 27, 2013    No LE DVT  . Port a cath insertion Right 01/19/2010  . Colonoscopy  2014  . Upper gi endoscopy  2014  . Port-a-cath removal Right 09/03/2013    Procedure: REMOVAL PORT-A-CATH;  Surgeon: Conrad Kimbolton,  MD;  Location: [facility] OR;  Service: Vascular;  Laterality: Right;  . Breast biopsy Right 12-31-13    invasive mammary  . Breast surgery Right 02/03/2014    Right simple mastectomy with sentinel node biopsy.  . Mastectomy    . Ankle surgery      FAMILY HISTORY Family History  Problem Relation Age of Onset  . Cancer Father     skin  . Heart disease Father   . Heart attack Father   . Breast cancer Maternal Aunt 60  . Breast cancer Maternal Grandmother 74       ADVANCED DIRECTIVES:    HEALTH MAINTENANCE: History  Substance Use Topics  . Smoking status: Never  Smoker   . Smokeless tobacco: Never Used  . Alcohol Use: No     Colonoscopy:  PAP:  Bone density:  Lipid panel:  Allergies  Allergen Reactions  . Fentanyl Nausea And Vomiting and Nausea Only    vomiting Was given in PACU x3 each time patient got sick  . Sulfa Antibiotics Hives and Other (See Comments)    Light headed, over heated    Current Outpatient Prescriptions  Medication Sig Dispense Refill  . acetaminophen (TYLENOL) 500 MG tablet Take 1,000 mg by mouth every 6 (six) hours as needed.    Marland Kitchen amantadine (SYMMETREL) 100 MG capsule TAKE 3 CAPSULES BY MOUTH EVERY DAY 270 capsule 2  . baclofen (LIORESAL) 10 MG tablet TAKE 2 TABLETS BY MOUTH IN THE MORNING, 1 TABLET AT LUNCH, AND 2 TABLETS AT NIGHT AS DIRECTED 150 tablet 6  . Cholecalciferol (VITAMIN D3) 2000 UNITS capsule Take 2,000 Units by mouth daily.    . Cranberry 1000 MG CAPS Take by mouth daily.    Marland Kitchen GLUCOSAMINE-CHONDROITIN PO Take 1 tablet by mouth 3 (three) times daily. STRENGTH: 1500-1200    . levETIRAcetam (KEPPRA) 500 MG tablet Take 1 tablet (500 mg total) by mouth 2 (two) times daily. 60 tablet 12  . lidocaine-prilocaine (EMLA) cream Apply 1 application topically once. Apply to port then cover with saran wrap 1-2 hours before chemotherapy    . loratadine (CLARITIN) 10 MG tablet Take 10 mg by mouth daily.    . Misc Natural Products (LEG VEIN & CIRCULATION) TABS Take by mouth daily.    . Multiple Vitamins-Minerals (MULTIVITAMIN PO) Take 1 tablet by mouth daily.    . naproxen (NAPROSYN) 250 MG tablet Take 500 mg by mouth as needed for mild pain.     Marland Kitchen oxybutynin (DITROPAN-XL) 10 MG 24 hr tablet Take 2 tablets (20 mg total) by mouth daily. 180 tablet 1  . oxyCODONE-acetaminophen (PERCOCET/ROXICET) 5-325 MG per tablet Take 1 tablet by mouth every 6 (six) hours as needed. for pain 120 tablet 0  . prochlorperazine (COMPAZINE) 10 MG tablet Take 10 mg by mouth every 6 (six) hours as needed.     Marland Kitchen ibuprofen (ADVIL,MOTRIN) 600 MG  tablet TAKE 1 TABLET BY MOUTH EVERY 6 HOURS AS NEEDED 90 tablet 2  . naproxen (NAPROSYN) 500 MG tablet TAKE 1 TABLET BY MOUTH TWICE A DAY 60 tablet 5  . Oxycodone HCl 10 MG TABS Take 1 tablet (10 mg total) by mouth every 6 (six) hours as needed. 120 tablet 0   No current facility-administered medications for this visit.    OBJECTIVE: Filed Vitals:   07/17/14 1045  BP: 132/83  Pulse: 71  Temp: 97.5 F (36.4 C)  Resp: 18     There is no weight on file to calculate BMI.  ECOG FS:1 - Symptomatic but completely ambulatory  General: Well-developed, well-nourished, no acute distress. Sitting in a wheelchair. Eyes: anicteric sclera. Lungs: Clear to auscultation bilaterally. Heart: Regular rate and rhythm. No rubs, murmurs, or gallops. Abdomen: Soft, nontender, nondistended. No organomegaly noted, normoactive bowel sounds. Musculoskeletal: Bilateral lower extremity lymphedema. Neuro: Alert, answering all questions appropriately. Cranial nerves grossly intact. Skin: No rashes or petechiae noted. Psych: Normal affect.   LAB RESULTS:  Lab Results  Component Value Date   NA 136 07/17/2014   K 3.6 07/17/2014   CL 104 07/17/2014   CO2 28 07/17/2014   GLUCOSE 93 07/17/2014   BUN 21* 07/17/2014   CREATININE 0.46 07/17/2014   CALCIUM 9.1 07/17/2014   PROT 7.4 07/17/2014   ALBUMIN 4.5 07/17/2014   AST 19 07/17/2014   ALT 16 07/17/2014   ALKPHOS 73 07/17/2014   BILITOT 0.5 07/17/2014   GFRNONAA >60 07/17/2014   GFRAA >60 07/17/2014    Lab Results  Component Value Date   WBC 3.3* 07/17/2014   NEUTROABS 2.0 07/17/2014   HGB 11.5* 07/17/2014   HCT 35.0 07/17/2014   MCV 92.7 07/17/2014   PLT 130* 07/17/2014     STUDIES: No results found.  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 BRCA negative.  PLAN:    1. Breast cancer: Because patient had a full mastectomy, she will not require adjuvant XRT. Given the fact that she is a triple negative  cancer, she will benefit from adjuvant chemotherapy. Given her underlying history of multiple sclerosis, will coordinate treatments with her primary neurologist. Patient does not require an aromatase inhibitor. Pretreatment MUGA revealed an EF of 67%.  Patient has now completed 4 cycles of Adriamycin and Cytoxan. Proceed with cycle 6 of 12 of weekly Taxol, which has been dose reduced 10% secondary to her neuropathy. After lengthy discussion with patient, we have decided that her symptoms of pain and decreased performance status will return after 1 treatment, we will likely discontinue chemotherapy altogether. If not, return to clinic in 1 week for consideration of cycle 7. 2. Multiple sclerosis: Patient is currently receiving treatment with "Rebif-rebidose" which will be on hold temporarily during chemotherapy. 3. Lymphedema: Continue treatment and wraps as prescribed.  4. Peripheral neuropathy: Dose reduce Taxol as above.  Her primary neurologist is Dr. Andrey Spearman.  phone [phone], fax [phone].   Patient expressed understanding and was in agreement with this plan. She also understands that She can call clinic at any time with any questions, concerns, or complaints.   Breast cancer   Staging form: Breast, AJCC 7th Edition     Pathologic stage from 05/24/2014: Stage IA (T1c, N0, cM0) - Signed by Lloyd Huger, MD on 05/24/2014   Lloyd Huger, MD   08/10/2014 12:59 PM

## 2014-08-13 ENCOUNTER — Encounter: Payer: Medicare Other | Admitting: Occupational Therapy

## 2014-08-20 ENCOUNTER — Encounter: Payer: Self-pay | Admitting: Occupational Therapy

## 2014-08-20 ENCOUNTER — Ambulatory Visit: Payer: Medicare Other | Attending: Family Medicine | Admitting: Occupational Therapy

## 2014-08-20 DIAGNOSIS — I890 Lymphedema, not elsewhere classified: Secondary | ICD-10-CM | POA: Insufficient documentation

## 2014-08-21 ENCOUNTER — Ambulatory Visit: Payer: Medicare Other | Admitting: Oncology

## 2014-08-21 NOTE — Therapy (Signed)
[place] MAIN [facility] SERVICES [address] [place], [place], [address] Phone: [phone]   Fax:  [phone]  Occupational Therapy Treatment  Patient Details  Name: Rebecca [name] MRN: [id number] Date of Birth: 1960/08/12 Referring Provider:  Bobetta Lime, MD  Encounter Date: 08/20/2014      OT End of Session - 08/20/14 2154    Visit Number 9   Number of Visits 36   Date for OT Re-Evaluation 10/13/14   Authorization Type Medicare G code visit 9   Authorization Time Period of 10   OT Start Time 1005   OT Stop Time 1110   OT Time Calculation (min) 65 min   Activity Tolerance Patient tolerated treatment well;No increased pain   Behavior During Therapy [facility] for tasks assessed/performed      Past Medical History  Diagnosis Date  . Herpes zoster   . Multiple sclerosis 2001    Walks from room to room @ home; but Wheelchair when going out.  . Closed head injury with brief loss of consciousness   . Seizures     Takes Keppra  . IBS (irritable bowel syndrome)     no longer  . Bacterial endocarditis     History of .  Marland Kitchen Aortic valve disease     Mild AS / AI (cannot exclude Bicuspid AoV)  . Syncope and collapse   . Bilateral lower extremity edema     Noncardiac.  Chronic. LE Venous dopplers - negative for DVT.; Echocardiogram January 2016: Normal EF with normal wall motion and valve function. Only grade 1 diastolic dysfunction. EF 60-65%. Mild MR  . Neuromuscular disorder     MS  . Heart murmur   . Lymphedema     has legs wrapped at [facility]  . Cervical stenosis of spine   . Cancer 12-31-13    Right breast, 12:00, 1.5 cm, T1c,N0 invasive mammary carcinoma, triple negative.  . Breast cancer   . MS (multiple sclerosis)   . Irritable bowel     Past Surgical History  Procedure Laterality Date  . Cholecystectomy    . Ankle surgery      Left  . Port-a-cath removal      right  . Anterior cervical decomp/discectomy fusion  11/17/2011     Procedure: ANTERIOR CERVICAL DECOMPRESSION/DISCECTOMY FUSION 2 LEVELS;  Surgeon: Erline Levine, MD;  Location: [place] NEURO ORS;  Service: Neurosurgery;  Laterality: N/A;  Cervical Five-Six Six-Seven Anterior cervical decompression/diskectomy/fusion  . Transthoracic echocardiogram  03/2013; 02/2014    a) Normal LV size and function with EF 60-65%.; Cannot exclude bicuspid aortic valve with mild AS and mild AI.; b) Normal EF with normal wall motion and valve function x Mild MR. G2 DD. EF 60-65%. Tricuspid AoV  . Lower extremity venous dopplers  Feb 27, 2013    No LE DVT  . Port a cath insertion Right 01/19/2010  . Colonoscopy  2014  . Upper gi endoscopy  2014  . Port-a-cath removal Right 09/03/2013    Procedure: REMOVAL PORT-A-CATH;  Surgeon: Conrad West Peavine, MD;  Location: [place];  Service: Vascular;  Laterality: Right;  . Breast biopsy Right 12-31-13    invasive mammary  . Breast surgery Right 02/03/2014    Right simple mastectomy with sentinel node biopsy.  . Mastectomy    . Ankle surgery      There were no vitals filed for this visit.  Visit Diagnosis:  Lymphedema      Subjective Assessment - 08/20/14  2153    Subjective  Patient reports husband had kidney stones and an infection and was not able to physically get her out of the house or manage the ramp.  He still wrapped her legs when he could.  She reports she had pain last night and he had to remove some of the foam.    Patient is accompained by: Family member   Patient Stated Goals Patient wants to get her legs back down to a smaller size, be able to transfer and walk better as well as be able to help more with her self care tasks, especially toileting.   Currently in Pain? Yes   Pain Score 3    Pain Location Leg   Pain Orientation Left   Pain Descriptors / Indicators Burning;Cramping   Pain Type Acute pain   Pain Onset Yesterday   Pain Frequency Intermittent   Multiple Pain Sites No             LYMPHEDEMA/ONCOLOGY  QUESTIONNAIRE - 08/21/14 2125    Right Lower Extremity Lymphedema   20 cm Proximal to Suprapatella 70 cm   10 cm Proximal to Suprapatella 69 cm   At Midpatella/Popliteal Crease 47.8 cm   30 cm Proximal to Floor at Lateral Plantar Foot 47 cm   20 cm Proximal to Floor at Lateral Plantar Foot 37.$RemoveBeforeD'4 1   10 'DGVKBXCWNTIqbi$ cm Proximal to Floor at Lateral Malleoli 25.7 cm   5 cm Proximal to 1st MTP Joint 22.3 cm   Across MTP Joint 25.3 cm   Around Proximal Great Toe 9.5 cm   Other 32.3   Left Lower Extremity Lymphedema   20 cm Proximal to Suprapatella 68 cm   10 cm Proximal to Suprapatella 67.5 cm   At Midpatella/Popliteal Crease 49.8 cm   30 cm Proximal to Floor at Lateral Plantar Foot 43.6 cm   20 cm Proximal to Floor at Lateral Plantar Foot 32.5 cm   10 cm Proximal to Floor at Lateral Malleoli 25.5 cm   5 cm Proximal to 1st MTP Joint 22.6 cm   Across MTP Joint 23.8 cm   Around Proximal Great Toe 10.1 cm   Other 31.6                 OT Treatments/Exercises (OP) - 08/20/14 2153    Manual Therapy   Manual Therapy Edema management   Manual therapy comments Patient arrived with bandages intact on bilateral lower extremities from foot to knee. Therapist removed bandages and performed skin inspection. Patient did not have any open areas noted on her bilateral lower extremities, however, she did have some redness and irritation on the left shin area. Circumferential measurements taken and recorded on flowsheet. Patient seen for Skin care of bilateral lower extremities followed with application of Eucerin lotion from foot to knee bilaterally. Stockinette applied from bilateral foot to knee.Rosidal foam from foot to mid calf and then another roll from ankle to knee bilaterally. Short stretch bandages applied as follows: one 6 cm bandage applied to foot and up to calf. One 8 cm bandage applied from foot to mid leg. One 10 cm bandage from mid leg to knee. All bandages applied with 50% stretch in a circular  wrap pattern. Topper added to keep tape and bandages in place.                OT Education - 08/20/14 2154    Education provided No   Person(s) Educated Patient;Spouse   Methods Demonstration;Explanation;Verbal cues  Comprehension Verbalized understanding;Returned demonstration;Verbal cues required             OT Long Term Goals - 07/28/14 1505    OT LONG TERM GOAL #1   Title . Patient to complete toileting with minimal assistance of one person in 12 weeks   Baseline min to mod assist, level flucuates daily   Time 12   Period Weeks   Status Partially Met   OT LONG TERM GOAL #2   Title Patient to complete bed to wheelchair transfer with minimal assist in 12 weeks.   Baseline mod to max assist, level flucuates   Time 12   Period Weeks   Status Partially Met   OT LONG TERM GOAL #3   Title Optimal edema control will be achieved, patient and caregiver to be independent in wearing of appropriate compression garments in 12 weeks.    Baseline increased edema and requiring compression bandaging to decongest legs.   Time 12   Period Weeks   Status On-going   OT LONG TERM GOAL #4   Title Husband to be independent with donning and doffing of compression daytime and night time garments in 12 weeks   Baseline unable    Time 12   Period Weeks   Status On-going   OT LONG TERM GOAL #5   Title Optimal edema control will be achieved and patient will be measured for and appropriate compression garment in 12 weeks   Baseline not yet decongested to be measured for custom garments   Time 12   Period Weeks   Status On-going   OT LONG TERM GOAL #6   Title Patient and family to be independent in skincare, HEP and self bandaging in 12 weeks   Baseline minimal cues and assist, familiar with compression bandaging from the past.   Time 12   Period Weeks   Status Partially Met   OT LONG TERM GOAL #7   Title  Patient will show a decrease in circumference in affected LE at toes 2cm ,  foot 3 cm ; ankle and calf 5cm, thighs 5 cm to fit in appropriate compression garments and in shoe in 12 weeks   Baseline see flowsheet for circumferential measurements   Time 12   Period Weeks   Status On-going               Plan - 08/21/14 2118    Clinical Impression Statement Patient has decongested well in the lower part of her legs, good progress throughout, some redness noted this date on the left anterior portion of the shin, she feels maybe husband may have wrapped her too snug.  She does show pocketing of fluid at the knee and her thighs are larger and will need to be decongested for optimal results.  In the past she has not been able to tolerate compression to both thighs since it is difficult for her to manage toileting and the thighs rubbing together.  Will need to reassess and potentially treat one thigh at a time and see if we can get her back into her custom compression garments, if that cannot be achieved then she will likely need to be fitted for new compression garments.         Problem List Patient Active Problem List   Diagnosis Date Noted  . Hematoma complicating a procedure 03/06/2014  . Breast cancer 01/10/2014  . SOB (shortness of breath) 03/01/2013  . Bilateral lower extremity edema   . Aortic insufficiency   .  Syncope 06/08/2011  . Herpes zoster 06/08/2011  . Multiple sclerosis    Zahriyah Joo Oneita Jolly, OTR/L, CLT Selby Slovacek 08/21/2014, 9:30 PM  [place] MAIN [facility] SERVICES [address] [place], [place], [address] Phone: [phone]   Fax:  [phone]

## 2014-08-27 ENCOUNTER — Inpatient Hospital Stay: Payer: Medicare Other | Attending: Family Medicine | Admitting: Oncology

## 2014-08-27 VITALS — BP 114/76 | HR 78 | Temp 97.9°F | Resp 16

## 2014-08-27 DIAGNOSIS — G35 Multiple sclerosis: Secondary | ICD-10-CM | POA: Insufficient documentation

## 2014-08-27 DIAGNOSIS — I890 Lymphedema, not elsewhere classified: Secondary | ICD-10-CM | POA: Diagnosis not present

## 2014-08-27 DIAGNOSIS — R600 Localized edema: Secondary | ICD-10-CM | POA: Diagnosis not present

## 2014-08-27 DIAGNOSIS — Z171 Estrogen receptor negative status [ER-]: Secondary | ICD-10-CM | POA: Diagnosis not present

## 2014-08-27 DIAGNOSIS — C50911 Malignant neoplasm of unspecified site of right female breast: Secondary | ICD-10-CM | POA: Diagnosis not present

## 2014-08-27 DIAGNOSIS — C50912 Malignant neoplasm of unspecified site of left female breast: Secondary | ICD-10-CM

## 2014-08-27 DIAGNOSIS — G629 Polyneuropathy, unspecified: Secondary | ICD-10-CM | POA: Diagnosis not present

## 2014-08-27 DIAGNOSIS — Z79899 Other long term (current) drug therapy: Secondary | ICD-10-CM | POA: Diagnosis not present

## 2014-08-29 ENCOUNTER — Encounter: Payer: Medicare Other | Admitting: Occupational Therapy

## 2014-09-05 ENCOUNTER — Ambulatory Visit: Payer: Medicare Other | Admitting: Occupational Therapy

## 2014-09-07 NOTE — Progress Notes (Signed)
[facility]  [phone] [phone]  ID: Rebecca [name] OB: 1960-10-01  MR#: [id number]  CBV#:[id number]  Patient Care Team: Edwena Felty, MD as PCP - General Edwena Felty, MD as Referring Physician Earline Mayotte, MD (General Surgery)  CHIEF COMPLAINT:  Chief Complaint  Patient presents with  . Follow-up    breast cancer    INTERVAL HISTORY: Patient returns to clinic today for further evaluation. Since discontinuing Taxol treatment, her leg pain and performance status have improved. She is now nearly back to her baseline. She peripheral neuropathy is unchanged. She continues to have significant edema in her bilateral lower extremities. She does not report headache. She denies any chest pain or shortness of breath. She denies any nausea, vomiting, constipation, or diarrhea. She has no urinary complaints. Patient offers no further specific complaints.   REVIEW OF SYSTEMS:   Review of Systems  Constitutional: Positive for malaise/fatigue. Negative for fever.  Cardiovascular: Positive for leg swelling.  Neurological: Positive for sensory change and weakness.    As per HPI. Otherwise, a complete review of systems is negatve.  PAST MEDICAL HISTORY: Past Medical History  Diagnosis Date  . Herpes zoster   . Multiple sclerosis 2001    Walks from room to room @ home; but Wheelchair when going out.  . Closed head injury with brief loss of consciousness   . Seizures     Takes Keppra  . IBS (irritable bowel syndrome)     no longer  . Bacterial endocarditis     History of .  Marland Kitchen Aortic valve disease     Mild AS / AI (cannot exclude Bicuspid AoV)  . Syncope and collapse   . Bilateral lower extremity edema     Noncardiac.  Chronic. LE Venous dopplers - negative for DVT.; Echocardiogram January 2016: Normal EF with normal wall motion and valve function. Only grade 1 diastolic dysfunction. EF 60-65%. Mild MR  . Neuromuscular disorder     MS    . Heart murmur   . Lymphedema     has legs wrapped at [facility]  . Cervical stenosis of spine   . Cancer 12-31-13    Right breast, 12:00, 1.5 cm, T1c,N0 invasive mammary carcinoma, triple negative.  . Breast cancer   . MS (multiple sclerosis)   . Irritable bowel     PAST SURGICAL HISTORY: Past Surgical History  Procedure Laterality Date  . Cholecystectomy    . Ankle surgery      Left  . Port-a-cath removal      right  . Anterior cervical decomp/discectomy fusion  11/17/2011    Procedure: ANTERIOR CERVICAL DECOMPRESSION/DISCECTOMY FUSION 2 LEVELS;  Surgeon: Maeola Harman, MD;  Location: [facility]UR[facility];  Service: Neurosurgery;  Laterality: N/A;  Cervical Five-Six Six-Seven Anterior cervical decompression/diskectomy/fusion  . Transthoracic echocardiogram  03/2013; 02/2014    a) Normal LV size and function with EF 60-65%.; Cannot exclude bicuspid aortic valve with mild AS and mild AI.; b) Normal EF with normal wall motion and valve function x Mild MR. G2 DD. EF 60-65%. Tricuspid AoV  . Lower extremity venous dopplers  Feb 27, 2013    No LE DVT  . Port a cath insertion Right 01/19/2010  . Colonoscopy  2014  . Upper gi endoscopy  2014  . Port-a-cath removal Right 09/03/2013    Procedure: REMOVAL PORT-A-CATH;  Surgeon: Fransisco Hertz, MD;  Location: [facility] OR;  Service: Vascular;  Laterality: Right;  . Breast biopsy Right 12-31-13  invasive mammary  . Breast surgery Right 02/03/2014    Right simple mastectomy with sentinel node biopsy.  . Mastectomy    . Ankle surgery      FAMILY HISTORY Family History  Problem Relation Age of Onset  . Cancer Father     skin  . Heart disease Father   . Heart attack Father   . Breast cancer Maternal Aunt 60  . Breast cancer Maternal Grandmother 74       ADVANCED DIRECTIVES:    HEALTH MAINTENANCE: History  Substance Use Topics  . Smoking status: Never Smoker   . Smokeless tobacco: Never Used  . Alcohol Use: No     Colonoscopy:  PAP:  Bone  density:  Lipid panel:  Allergies  Allergen Reactions  . Fentanyl Nausea And Vomiting and Nausea Only    vomiting Was given in PACU x3 each time patient got sick  . Sulfa Antibiotics Hives and Other (See Comments)    Light headed, over heated    Current Outpatient Prescriptions  Medication Sig Dispense Refill  . acetaminophen (TYLENOL) 500 MG tablet Take 1,000 mg by mouth every 6 (six) hours as needed.    Marland Kitchen amantadine (SYMMETREL) 100 MG capsule TAKE 3 CAPSULES BY MOUTH EVERY DAY 270 capsule 2  . baclofen (LIORESAL) 10 MG tablet TAKE 2 TABLETS BY MOUTH IN THE MORNING, 1 TABLET AT LUNCH, AND 2 TABLETS AT NIGHT AS DIRECTED 150 tablet 6  . Cholecalciferol (VITAMIN D3) 2000 UNITS capsule Take 2,000 Units by mouth daily.    . Cranberry 1000 MG CAPS Take by mouth daily.    Marland Kitchen GLUCOSAMINE-CHONDROITIN PO Take 1 tablet by mouth 3 (three) times daily. STRENGTH: 1500-1200    . ibuprofen (ADVIL,MOTRIN) 600 MG tablet TAKE 1 TABLET BY MOUTH EVERY 6 HOURS AS NEEDED 90 tablet 2  . levETIRAcetam (KEPPRA) 500 MG tablet Take 1 tablet (500 mg total) by mouth 2 (two) times daily. 60 tablet 12  . lidocaine-prilocaine (EMLA) cream Apply 1 application topically once. Apply to port then cover with saran wrap 1-2 hours before chemotherapy    . loratadine (CLARITIN) 10 MG tablet Take 10 mg by mouth daily.    . Misc Natural Products (LEG VEIN & CIRCULATION) TABS Take by mouth daily.    . Multiple Vitamins-Minerals (MULTIVITAMIN PO) Take 1 tablet by mouth daily.    Marland Kitchen oxybutynin (DITROPAN-XL) 10 MG 24 hr tablet Take 2 tablets (20 mg total) by mouth daily. 180 tablet 1  . Oxycodone HCl 10 MG TABS Take 1 tablet (10 mg total) by mouth every 6 (six) hours as needed. 120 tablet 0  . prochlorperazine (COMPAZINE) 10 MG tablet Take 10 mg by mouth every 6 (six) hours as needed.      No current facility-administered medications for this visit.    OBJECTIVE: Filed Vitals:   08/27/14 1057  BP: 114/76  Pulse: 78  Temp:  97.9 F (36.6 C)  Resp: 16     There is no weight on file to calculate BMI.    ECOG FS:1 - Symptomatic but completely ambulatory  General: Well-developed, well-nourished, no acute distress. Sitting in a wheelchair. Eyes: anicteric sclera. Lungs: Clear to auscultation bilaterally. Heart: Regular rate and rhythm. No rubs, murmurs, or gallops. Abdomen: Soft, nontender, nondistended. No organomegaly noted, normoactive bowel sounds. Musculoskeletal: Bilateral lower extremity lymphedema. Neuro: Alert, answering all questions appropriately. Cranial nerves grossly intact. Skin: No rashes or petechiae noted. Psych: Normal affect.   LAB RESULTS:  Lab Results  Component  Value Date   NA 136 07/17/2014   K 3.6 07/17/2014   CL 104 07/17/2014   CO2 28 07/17/2014   GLUCOSE 93 07/17/2014   BUN 21* 07/17/2014   CREATININE 0.46 07/17/2014   CALCIUM 9.1 07/17/2014   PROT 7.4 07/17/2014   ALBUMIN 4.5 07/17/2014   AST 19 07/17/2014   ALT 16 07/17/2014   ALKPHOS 73 07/17/2014   BILITOT 0.5 07/17/2014   GFRNONAA >60 07/17/2014   GFRAA >60 07/17/2014    Lab Results  Component Value Date   WBC 3.3* 07/17/2014   NEUTROABS 2.0 07/17/2014   HGB 11.5* 07/17/2014   HCT 35.0 07/17/2014   MCV 92.7 07/17/2014   PLT 130* 07/17/2014     STUDIES: No results found.  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 BRCA negative.  PLAN:    1. Breast cancer: Because patient had a full mastectomy, she will not require adjuvant XRT. Given the fact that she is a triple negative cancer, she will not require an aromatase inhibitor. Given her difficulties with Taxol and multiple sclerosis, will discontinue treatment altogether at this point. Patient received a total of 6 of 12 cycles of Taxol. No further intervention is needed at this time. Return to clinic in 3 months with repeat laboratory work and further evaluation.  2. Multiple sclerosis: Treatment per patient's primary  neurologist. Patient states she has follow-up with him in the next several weeks to discuss reinitiating treatments. 3. Lymphedema: Continue treatment and wraps as prescribed.  4. Peripheral neuropathy: Monitor.  Her primary neurologist is Dr. Andrey Spearman.  phone [phone], fax [phone].   Patient expressed understanding and was in agreement with this plan. She also understands that She can call clinic at any time with any questions, concerns, or complaints.   Breast cancer   Staging form: Breast, AJCC 7th Edition     Pathologic stage from 05/24/2014: Stage IA (T1c, N0, cM0) - Signed by Lloyd Huger, MD on 05/24/2014   Lloyd Huger, MD   09/07/2014 9:44 AM

## 2014-09-10 ENCOUNTER — Ambulatory Visit: Payer: Medicare Other | Admitting: Diagnostic Neuroimaging

## 2014-09-11 ENCOUNTER — Other Ambulatory Visit: Payer: Self-pay | Admitting: *Deleted

## 2014-09-11 DIAGNOSIS — C50919 Malignant neoplasm of unspecified site of unspecified female breast: Secondary | ICD-10-CM

## 2014-09-11 MED ORDER — OXYCODONE HCL 10 MG PO TABS: 10.0000 mg | ORAL_TABLET | Freq: Four times a day (QID) | ORAL | Status: DC | PRN

## 2014-09-16 ENCOUNTER — Ambulatory Visit (INDEPENDENT_AMBULATORY_CARE_PROVIDER_SITE_OTHER): Payer: Medicare Other | Admitting: Diagnostic Neuroimaging

## 2014-09-16 ENCOUNTER — Encounter: Payer: Self-pay | Admitting: Diagnostic Neuroimaging

## 2014-09-16 VITALS — BP 118/73 | HR 69

## 2014-09-16 DIAGNOSIS — G35D Multiple sclerosis, unspecified: Secondary | ICD-10-CM | POA: Insufficient documentation

## 2014-09-16 DIAGNOSIS — C50911 Malignant neoplasm of unspecified site of right female breast: Secondary | ICD-10-CM

## 2014-09-16 DIAGNOSIS — G35 Multiple sclerosis: Secondary | ICD-10-CM | POA: Diagnosis not present

## 2014-09-16 DIAGNOSIS — G40909 Epilepsy, unspecified, not intractable, without status epilepticus: Secondary | ICD-10-CM

## 2014-09-16 NOTE — Patient Instructions (Signed)
I will check MRI scan.  I will restart your rebif.

## 2014-09-16 NOTE — Progress Notes (Signed)
[facility]  PATIENT: Rebecca [name] DOB: [birth year]  REFERRING CLINICIAN:  HISTORY FROM: patient, husband, aunt REASON FOR VISIT: follow up   HISTORICAL  CHIEF COMPLAINT:  Chief Complaint  Patient presents with  . MS    rm 6, husband - [name], aunt Erma  . Follow-up    HISTORY OF PRESENT ILLNESS:   UPDATE 09/16/14: Since last visit, completed breast CA treatments (adriamycin, cytoxan, taxol; couldn't complete the taxol cycles). Now ready for restarting MS treatments. No focal flares of MS, but had generalized slowness, fatigue, muscle spasms.   UPDATE 02/13/14: Since last visit, has been diagnosed with triple negative breast CA, s/p right mastectomy. Now planning to have chemotherapy. No new neuro symptoms. Tolerating rebif.   UPDATE 11/13/13: Since last visit, doing well. Getting wrap / lymphedema therapy via Amy (OT) at [facility] with great results. Significant improvement. Tolerating rebif. No new lesions or symptoms.  UPDATE 08/14/13: Since last visit, has continued on rebif. BLE edema still a problem. Saw cardiology, tried diuretic, and also trying wrap/massage therapy next week. Walking and mobility gradually worsening.   UPDATE 02/13/13 (LL): The patient returns for followup visit. Since last visit she states that she has seen Dr. Katy Fitch and that she needs new prescription eyeglasses for vision problems that the double vision resolved. She is tolerating rebif. Her biggest concern is that her bilateral leg edema is making it difficult for her to move her legs or ambulate. She is not consistently wearing her TED hose. She denies any shortness of breath, but is mostly wheelchair-bound. She states that she feels like she is progressively getting weaker. She asked for information about Tecfidera. She said she had a cardiology consultation in the past when she started Gilenya, but that Dr. has since retired. She states she was diagnosed with aortic insufficiency. Her urgency and  frequency of urination is getting worse, she asked if she can be increased on her medication.   UPDATE 11/07/12: Since last visit, doing well, until 1 month ago and developed fluctuating double vision/blurred vision. Affects right greater than left eye. It is present even if she closes the right or left eyes.   UPDATE 04/20/12: Since last visit, has come off gabapentin. BLE swelling stable. S/p ACDF surg per Dr. Vertell Limber and doing well. Tolerating rebif. Has new PCP and also getting PT. Few days ago, developed some numbness in right hand, similar to pre-op symptoms which improved post-op. No neck pain.   UPDATE 01/10/12: Noticed worsening swelling to bilateral lower extremities since 01/04/12. She is also having throbbing pain to shins. Difficulty with wearing sneakers. Unable to use leg compression machine. Denies shortness of breath.   UPDATE 10/27/11: Having surgery on c-spine fusion on November 17, 2011. Concerned about recovery time and her ability to perform ADL's. Lives with aunt. Wanting to know if she needs to have someone come to home or stay at rehab. Intermittent blurred vision las 3 weeks.   UPDATE 07/27/11: Doing better with shingles and post-herpetic neuralgia pain; on gralise. Now with worsening RLE weakness, fatigue, vision changes, speech diff. Discussed tx options.   UPDATE 06/02/11: On 05/27/11 developed right lower tooth pain; 4/20 developed blisters and rash on right chin and jaw. 4/22 went to dentist, diagnosed with tooth infx and given amoxicillin. Rash more significant. 06/01/11, went to dermatologist and dx'd possible shingles. Given valtrex and started on 4/24. Lesions still oozing. Has right ear and right facial pain, severe. No eye pain.   UPDATE 03/02/10:  Patient was getting worked up for first dose observation for Gilenya, however on 02/28/2011, patient had a suspected seizure. She was in the bathroom at home, stood up and without warning have loss of consciousness. Her mother found  her on the floor groaning with some twitching movements of her upper extremities, and her legs outstretched in front of her. No incontinence or tongue biting. It took approximately 20-30 minutes for her to wake up. She was amnestic events until the next day.  In retrospect, patient had 4 or 5 episodes of syncope versus seizure in the early 1980s. She was never on antiseizure medication. Patient was also on Ampyra since past 2 years.   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 Last dose tysabri on 10/29/10. She would like to switch to [place]. No MS flares. Some increased fatigue. Also asking if amantadine can be stopped to see if peripheral edema improves.   PRIOR HPI (11/02/10): 54 year old right-handed female with history of multiple sclerosis. Here for transfer of care from [place]. 2001 - patient developed left leg numbness and weakness, followed by right facial weakness and slurred speech. She was evaluated with MRI of the brain, diagnosed with multiple sclerosis and started on Copaxone. She did not have lumbar puncture or spinal cord MRI. 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 She thinks she was off of Copaxone for approximately 2 years. 2008-9 - patient was evaluated at [facility] and started on Tysabri. She thinks she has been on Tysabri for approximately 3-1/2 years. Last dose was last week at [facility]. She continues to decline in terms of mobility. She is to walk using a cane, then started using a walker, now uses a transport chair occasionally. Patient continues to have intermittent right facial twitching and numbness. She is on amantadine, baclofen, Amprya, and oxybutynin for MS symptoms.    REVIEW OF SYSTEMS: Full 14 system review of systems performed and notable only for leg swelling murmur fatigue.   ALLERGIES: Allergies  Allergen Reactions  . Fentanyl Nausea And Vomiting and Nausea Only    vomiting Was given in PACU x3  each time patient got sick  . Sulfa Antibiotics Hives and Other (See Comments)    Light headed, over heated    HOME MEDICATIONS: Outpatient Prescriptions Prior to Visit  Medication Sig Dispense Refill  . amantadine (SYMMETREL) 100 MG capsule TAKE 3 CAPSULES BY MOUTH EVERY DAY 270 capsule 2  . baclofen (LIORESAL) 10 MG tablet TAKE 2 TABLETS BY MOUTH IN THE MORNING, 1 TABLET AT LUNCH, AND 2 TABLETS AT NIGHT AS DIRECTED 150 tablet 6  . Cholecalciferol (VITAMIN D3) 2000 UNITS capsule Take 2,000 Units by mouth daily.    . Cranberry 1000 MG CAPS Take by mouth daily.    Marland Kitchen GLUCOSAMINE-CHONDROITIN PO Take 1 tablet by mouth 3 (three) times daily. STRENGTH: 1500-1200    . ibuprofen (ADVIL,MOTRIN) 600 MG tablet TAKE 1 TABLET BY MOUTH EVERY 6 HOURS AS NEEDED 90 tablet 2  . levETIRAcetam (KEPPRA) 500 MG tablet Take 1 tablet (500 mg total) by mouth 2 (two) times daily. 60 tablet 12  . loratadine (CLARITIN) 10 MG tablet Take 10 mg by mouth daily.    . Misc Natural Products (LEG VEIN & CIRCULATION) TABS Take by mouth daily.    . Multiple Vitamins-Minerals (MULTIVITAMIN PO) Take 1 tablet by mouth daily.    Marland Kitchen oxybutynin (DITROPAN-XL) 10 MG 24 hr tablet Take 2 tablets (20 mg total) by  mouth daily. 180 tablet 1  . Oxycodone HCl 10 MG TABS Take 1 tablet (10 mg total) by mouth every 6 (six) hours as needed. 120 tablet 0  . prochlorperazine (COMPAZINE) 10 MG tablet Take 10 mg by mouth every 6 (six) hours as needed.     . lidocaine-prilocaine (EMLA) cream Apply 1 application topically once. Apply to port then cover with saran wrap 1-2 hours before chemotherapy    . acetaminophen (TYLENOL) 500 MG tablet Take 1,000 mg by mouth every 6 (six) hours as needed.     No facility-administered medications prior to visit.    PAST MEDICAL HISTORY: Past Medical History  Diagnosis Date  . Herpes zoster   . Multiple sclerosis 2001    Walks from room to room @ home; but Wheelchair when going out.  . Closed head injury with  brief loss of consciousness   . Seizures     Takes Keppra  . IBS (irritable bowel syndrome)     no longer  . Bacterial endocarditis     History of .  Marland Kitchen Aortic valve disease     Mild AS / AI (cannot exclude Bicuspid AoV)  . Syncope and collapse   . Bilateral lower extremity edema     Noncardiac.  Chronic. LE Venous dopplers - negative for DVT.; Echocardiogram January 2016: Normal EF with normal wall motion and valve function. Only grade 1 diastolic dysfunction. EF 60-65%. Mild MR  . Neuromuscular disorder     MS  . Heart murmur   . Lymphedema     has legs [facility]  . Cervical stenosis of spine   . Cancer 12-31-13    Right breast, 12:00, 1.5 cm, T1c,N0 invasive mammary carcinoma, triple negative.  . Breast cancer   . MS (multiple sclerosis)   . Irritable bowel     PAST SURGICAL HISTORY: Past Surgical History  Procedure Laterality Date  . Cholecystectomy    . Ankle surgery      Left  . Port-a-cath removal      right  . Anterior cervical decomp/discectomy fusion  11/17/2011    Procedure: ANTERIOR CERVICAL DECOMPRESSION/DISCECTOMY FUSION 2 LEVELS;  Surgeon: Erline Levine, MD;  Location: [facility];  Service: Neurosurgery;  Laterality: N/A;  Cervical Five-Six Six-Seven Anterior cervical decompression/diskectomy/fusion  . Transthoracic echocardiogram  03/2013; 02/2014    a) Normal LV size and function with EF 60-65%.; Cannot exclude bicuspid aortic valve with mild AS and mild AI.; b) Normal EF with normal wall motion and valve function x Mild MR. G2 DD. EF 60-65%. Tricuspid AoV  . Lower extremity venous dopplers  Feb 27, 2013    No LE DVT  . Port a cath insertion Right 01/19/2010  . Colonoscopy  2014  . Upper gi endoscopy  2014  . Port-a-cath removal Right 09/03/2013    Procedure: REMOVAL PORT-A-CATH;  Surgeon: Conrad Bethel Acres, MD;  Location: [facility];  Service: Vascular;  Laterality: Right;  . Breast biopsy Right 12-31-13    invasive mammary  . Breast surgery Right 02/03/2014      Right simple mastectomy with sentinel node biopsy.  . Mastectomy    . Ankle surgery      FAMILY HISTORY: Family History  Problem Relation Age of Onset  . Cancer Father     skin  . Heart disease Father   . Heart attack Father   . Breast cancer Maternal Aunt 60  . Breast cancer Maternal Grandmother 74    SOCIAL HISTORY:  History   Social History  . Marital Status: Married    Spouse Name: don  . Number of Children: 0  . Years of Education: 12   Occupational History  . disability    Social History Main Topics  . Smoking status: Never Smoker   . Smokeless tobacco: Never Used  . Alcohol Use: No  . Drug Use: No  . Sexual Activity:    Partners: Male   Other Topics Concern  . Not on file   Social History Narrative   She is married. Recently moved back to [place] after being in [place] for some time. She is accompanied by her husband and aunt.   Never smoked. Never used alcohol.     PHYSICAL EXAM  Filed Vitals:   09/16/14 1306  BP: 118/73  Pulse: 69    Not recorded      There is no weight on file to calculate BMI.  GENERAL EXAM:  General: Patient is awake, alert and in no acute distress. Well developed and groomed.  Neck: Neck is supple.  Cardiovascular: Heart is regular rate and rhythm with mild systolic murmur. PERIPHERAL EDEMA IN BLE.   Neurologic Exam  Mental Status: Awake, alert. Language is fluent and comprehension intact. Cranial Nerves: Pupils are equal and reactive to light. Visual fields are full to confrontation. Conjugate eye movements are full and symmetric. Facial sensation and strength are symmetric. Hearing is intact. Palate elevated symmetrically and uvula is midline. Shoulder shrug is symmetric. Tongue is midline.  Motor: Normal bulk and tone. Full strength in the upper extremities; BLE (2/5 prox, 3/5 distal), INCREASED TONE IN BLE. No pronator drift.  Sensory: Intact and symmetric to light touch.  Coordination: FTN  SMOOTH Reflexes: Deep tendon reflexes in the upper and lower extremity are present and symmetric; TRACE AT KNEES AND ANKLES. PERIPHERAL EDEMA. Gait and Station: [place].     DIAGNOSTIC DATA (LABS, IMAGING, TESTING) - I reviewed patient records, labs, notes, testing and imaging myself where available.  Lab Results  Component Value Date   WBC 3.3* 07/17/2014   HGB 11.5* 07/17/2014   HCT 35.0 07/17/2014   MCV 92.7 07/17/2014   PLT 130* 07/17/2014      Component Value Date/Time   NA 136 07/17/2014 0941   NA 139 06/05/2014 0951   NA 141 01/09/2014 0930   K 3.6 07/17/2014 0941   K 3.8 06/05/2014 0951   CL 104 07/17/2014 0941   CL 107 06/05/2014 0951   CO2 28 07/17/2014 0941   CO2 28 06/05/2014 0951   GLUCOSE 93 07/17/2014 0941   GLUCOSE 130* 06/05/2014 0951   GLUCOSE 86 01/09/2014 0930   BUN 21* 07/17/2014 0941   BUN 12 06/05/2014 0951   BUN 20 01/09/2014 0930   CREATININE 0.46 07/17/2014 0941   CREATININE 0.56 06/05/2014 0951   CALCIUM 9.1 07/17/2014 0941   CALCIUM 9.4 06/05/2014 0951   PROT 7.4 07/17/2014 0941   PROT 7.0 05/29/2014 0949   PROT 6.9 01/09/2014 0930   ALBUMIN 4.5 07/17/2014 0941   ALBUMIN 4.1 05/29/2014 0949   AST 19 07/17/2014 0941   AST 24 05/29/2014 0949   ALT 16 07/17/2014 0941   ALT 24 05/29/2014 0949   ALKPHOS 73 07/17/2014 0941   ALKPHOS 58 05/29/2014 0949   BILITOT 0.5 07/17/2014 0941   BILITOT 0.6 05/29/2014 0949   GFRNONAA >60 07/17/2014 0941   GFRNONAA >60 06/05/2014 0951   GFRAA >60 07/17/2014 0941   GFRAA >60 06/05/2014 6948  No results found for: CHOL No results found for: HGBA1C No results found for: VITAMINB12 No results found for: TSH  I reviewed images myself and agree with interpretation. -VRP  08/21/13 MRI brain (with and without) demonstrating:  1. Multiple periventricular and subcortical chronic demyelinating plaques. Some of these are confluent. Some are hypointense on T1.  2. No acute plaques.  3. No  significant change from MRI on 11/20/12.   08/21/13 MRI cervical spine (with and without) demonstrating:  1. At C2-3: disc bulging and facet hypertrophy with mild spinal stenosis and mild left foraminal stenosis.  2. At C5-6: uncovertebral joint hypertrophy with mild right foraminal stenosis.  3. Multiple chronic demyelinating plaques from C2 to T1. No abnormal enhancing lesions.  4. Compared to MRI from 10/04/11, there has been progression of degenerative spine disease at C2-3. Also there has been interval ACDF from C5-C7. No significant change in spinal cord lesions.  08/21/13 MRI thoracic spine (with and without) demonstrating:  1. Multiple spinal cord chronic demyelinating plaques from C7 to T12-L1.  2. No acute plaques.  3. ACDF at C5-C7.  4. Compared to prior MRI from 08/03/11, the ACDF is new. Overall no new demyelinating plaques.    ASSESSMENT AND PLAN  54 y.o. year old female here with multiple sclerosis, initially on copaxone, then was on Tysabri in the past (3-1/2 years); then became JCV +. Last dose tysabri 10/29/10. Switched to [place] on 04/25/11. Had brief seizure before starting gilenya, now on levetiracetam. Then with right V2, V3 herpes zoster on 05/27/11. Gilenya stopped. Shingles resolved.  Then on rebif. Also s/p ACDF C5-6 (Oct 2013) for spinal stenosis. Now with breast CA diagnosis, s/p right mastectomy, s/p adriamycin, cytoxan, taxol. Now treatments have been completed. Patient ready to restart MS therapy.  Dx:  MS (multiple sclerosis) - Plan: MR Brain W Wo Contrast  Breast cancer, right  Seizure disorder    PLAN:  1. Check MRI brain surveillance scan 2. Restart rebif 3. Continue baclofen for muscle spasms (stable) 4. Continue levetiracetam for seizure d/o (stable)  Orders Placed This Encounter  Procedures  . MR Brain W Wo Contrast   Return in about 6 months (around 03/19/2015).    Penni Bombard, MD 08/08/6201, 5:59 PM Certified in Neurology,  Neurophysiology and Neuroimaging  [facility] [address] [place], [place] [address] [phone]

## 2014-09-17 ENCOUNTER — Inpatient Hospital Stay: Payer: Medicare Other | Attending: Oncology

## 2014-09-17 DIAGNOSIS — C801 Malignant (primary) neoplasm, unspecified: Secondary | ICD-10-CM

## 2014-09-17 DIAGNOSIS — Z171 Estrogen receptor negative status [ER-]: Secondary | ICD-10-CM | POA: Diagnosis not present

## 2014-09-17 DIAGNOSIS — C50911 Malignant neoplasm of unspecified site of right female breast: Secondary | ICD-10-CM | POA: Insufficient documentation

## 2014-09-17 DIAGNOSIS — G35 Multiple sclerosis: Secondary | ICD-10-CM | POA: Diagnosis not present

## 2014-09-17 DIAGNOSIS — Z452 Encounter for adjustment and management of vascular access device: Secondary | ICD-10-CM | POA: Diagnosis not present

## 2014-09-17 MED ORDER — HEPARIN SOD (PORK) LOCK FLUSH 100 UNIT/ML IV SOLN
500.0000 [IU] | Freq: Once | INTRAVENOUS | Status: AC
  Administered 2014-09-17: 500 [IU] via INTRAVENOUS
  Filled 2014-09-17: qty 5

## 2014-09-17 MED ORDER — SODIUM CHLORIDE 0.9 % IJ SOLN
10.0000 mL | INTRAMUSCULAR | Status: DC | PRN
  Administered 2014-09-17: 10 mL via INTRAVENOUS
  Filled 2014-09-17: qty 10

## 2014-09-18 ENCOUNTER — Telehealth: Payer: Self-pay | Admitting: Diagnostic Neuroimaging

## 2014-09-18 NOTE — Telephone Encounter (Signed)
I called the patient.  Says she wants Rebif PFS, not Rebidose.  I verified this info with her twice.  I called [facility]  Spoke with Kristen.  She looked at the patient's file and said the patient is requesting Rebidose vs Pre-filled Syringes, and they need a change in administration.  Explained the patient does not wish to use Rebidose, she does want PFS.  She will note the file, and forward info to benefits investigation dept.  Says they did receive a fax from [facility] yesterday saying the patient does not need a titration kit.

## 2014-09-18 NOTE — Telephone Encounter (Signed)
Patient is calling and needs to talk about Rebiff. The patient states [facility] states the patient takes Rebiff  that is not a refilled dose but the patient states she takes a refilled dose. Please call to discuss. Thank you.

## 2014-09-22 ENCOUNTER — Telehealth: Payer: Self-pay | Admitting: Diagnostic Neuroimaging

## 2014-09-22 NOTE — Telephone Encounter (Signed)
Rebecca [name], [place] [facility], called to get clarification on medication Rebif. May call [phone] Rebecca [name].

## 2014-09-22 NOTE — Telephone Encounter (Signed)
I called back.  Spoke with Otila Kluver.  Verified it is okay to dispense titration kit, and to change from PFS to Rebidose.  They will proceed with order and call us back if anything further is needed.

## 2014-09-22 NOTE — Telephone Encounter (Signed)
Pt is calling about medication Rebif Rebidose, would like to speak to you

## 2014-09-22 NOTE — Telephone Encounter (Signed)
I called back and spoke with the patient.  Says she has decided she would like to use Rebidose instead of PFS.  She has called [facility] regarding this.

## 2014-09-22 NOTE — Telephone Encounter (Signed)
I called back.  Spoke with Annona.  She was not able to assist and transferred me to [place].  She stated they do have a note faxed from [facility] saying no titration necessary, but patient said she prefers a titration kit.  Also, form was completed for PFS, and patient would like to change to Rebidose injector instead.  Could you please verify if you agree or disagree with patient getting a titration kit?  I will be happy to call back with this info, and as well auth Rebidose vs PFS.  Thank you.

## 2014-09-22 NOTE — Telephone Encounter (Signed)
Ok for titration if patient wants. -VRP

## 2014-09-22 NOTE — Telephone Encounter (Signed)
Patient is calling to ask if they will start on low dosage Rx Rebif first and work her way up to 44 mg. Please call.

## 2014-09-24 ENCOUNTER — Ambulatory Visit: Payer: Medicare Other | Admitting: Occupational Therapy

## 2014-09-25 ENCOUNTER — Other Ambulatory Visit: Payer: Medicare Other

## 2014-09-30 ENCOUNTER — Ambulatory Visit
Admission: RE | Admit: 2014-09-30 | Discharge: 2014-09-30 | Disposition: A | Discharge status: Home / Self Care | Payer: Medicare Other | Source: Ambulatory Visit | Attending: Diagnostic Neuroimaging | Admitting: Diagnostic Neuroimaging

## 2014-09-30 DIAGNOSIS — G35 Multiple sclerosis: Secondary | ICD-10-CM | POA: Diagnosis not present

## 2014-09-30 MED ORDER — GADOBENATE DIMEGLUMINE 529 MG/ML IV SOLN
18.0000 mL | Freq: Once | INTRAVENOUS | Status: AC | PRN
  Administered 2014-09-30: 18 mL via INTRAVENOUS

## 2014-10-02 ENCOUNTER — Telehealth: Payer: Self-pay

## 2014-10-02 NOTE — Telephone Encounter (Signed)
Spoke with patient and informed her that her MRI was normal

## 2014-10-06 DIAGNOSIS — B351 Tinea unguium: Secondary | ICD-10-CM | POA: Diagnosis not present

## 2014-10-06 DIAGNOSIS — M79674 Pain in right toe(s): Secondary | ICD-10-CM | POA: Diagnosis not present

## 2014-10-06 DIAGNOSIS — M79675 Pain in left toe(s): Secondary | ICD-10-CM | POA: Diagnosis not present

## 2014-10-08 ENCOUNTER — Telehealth: Payer: Self-pay | Admitting: *Deleted

## 2014-10-08 DIAGNOSIS — C50919 Malignant neoplasm of unspecified site of unspecified female breast: Secondary | ICD-10-CM

## 2014-10-08 MED ORDER — OXYCODONE HCL 10 MG PO TABS
10.0000 mg | ORAL_TABLET | Freq: Four times a day (QID) | ORAL | Status: DC | PRN
Start: 2014-10-08 — End: 2014-10-15

## 2014-10-08 NOTE — Telephone Encounter (Signed)
Informed that Dr Grayland Ormond will give 2 weeks supply and that she needs to come in for evaluation of neuropathy if she is requiring that much pain med so she scheduled an appt for 9/7 @ 1130

## 2014-10-15 ENCOUNTER — Inpatient Hospital Stay: Payer: Medicare Other | Attending: Oncology | Admitting: Oncology

## 2014-10-15 VITALS — BP 96/65 | HR 73 | Temp 96.6°F | Resp 16

## 2014-10-15 DIAGNOSIS — Z853 Personal history of malignant neoplasm of breast: Secondary | ICD-10-CM | POA: Diagnosis not present

## 2014-10-15 DIAGNOSIS — G35 Multiple sclerosis: Secondary | ICD-10-CM | POA: Diagnosis not present

## 2014-10-15 DIAGNOSIS — Z9013 Acquired absence of bilateral breasts and nipples: Secondary | ICD-10-CM | POA: Diagnosis not present

## 2014-10-15 DIAGNOSIS — G629 Polyneuropathy, unspecified: Secondary | ICD-10-CM | POA: Insufficient documentation

## 2014-10-15 DIAGNOSIS — Z79899 Other long term (current) drug therapy: Secondary | ICD-10-CM | POA: Insufficient documentation

## 2014-10-15 DIAGNOSIS — C50919 Malignant neoplasm of unspecified site of unspecified female breast: Secondary | ICD-10-CM

## 2014-10-15 DIAGNOSIS — Z171 Estrogen receptor negative status [ER-]: Secondary | ICD-10-CM | POA: Diagnosis not present

## 2014-10-15 DIAGNOSIS — I890 Lymphedema, not elsewhere classified: Secondary | ICD-10-CM

## 2014-10-15 MED ORDER — FENTANYL 25 MCG/HR TD PT72: 25.0000 ug | MEDICATED_PATCH | TRANSDERMAL | Status: DC

## 2014-10-15 MED ORDER — OXYCODONE HCL 10 MG PO TABS: 10.0000 mg | ORAL_TABLET | Freq: Four times a day (QID) | ORAL | Status: DC | PRN

## 2014-10-15 NOTE — Progress Notes (Signed)
Patient is here for re-evaluation of the leg pain (she explains as let twitches)  she still has despite finishing her treatments.  The Oxycodone makes the leg pain tolerable when she takes on a schedule 6 hour regimen.  The pain medication is causing constipation and when she was trying to use the bathroom she felt an excrusiating pain in her head with loss of vision lasting a few minutes.  Since this episode she has a strange sensation in the top of her head that she explains as "pressure".

## 2014-10-21 ENCOUNTER — Telehealth: Payer: Self-pay

## 2014-10-21 NOTE — Progress Notes (Signed)
[facility]  [phone] [phone]  ID: Delfina Redwood OB: Sep 12, 1960  MR#: [id number]  TRE#:[id number]  Patient Care Team: Edwena Felty, MD as PCP - General Edwena Felty, MD as Referring Physician Earline Mayotte, MD (General Surgery)  CHIEF COMPLAINT:  Chief Complaint  Patient presents with  . Breast Cancer  . Peripheral Neuropathy    INTERVAL HISTORY: Patient returns to clinic today for further evaluation and discussion of her narcotic regimen. She continues to have significant pain, particularly in her legs but otherwise feels well. She peripheral neuropathy is unchanged. She continues to have significant edema in her bilateral lower extremities. She does not report headache. She denies any chest pain or shortness of breath. She denies any nausea, vomiting, constipation, or diarrhea. She has no urinary complaints. Patient offers no further specific complaints.   REVIEW OF SYSTEMS:   Review of Systems  Constitutional: Positive for malaise/fatigue. Negative for fever.  Cardiovascular: Positive for leg swelling.  Neurological: Positive for sensory change and weakness.    As per HPI. Otherwise, a complete review of systems is negatve.  PAST MEDICAL HISTORY: Past Medical History  Diagnosis Date  . Herpes zoster   . Multiple sclerosis 2001    Walks from room to room @ home; but Wheelchair when going out.  . Closed head injury with brief loss of consciousness   . Seizures     Takes Keppra  . IBS (irritable bowel syndrome)     no longer  . Bacterial endocarditis     History of .  Marland Kitchen Aortic valve disease     Mild AS / AI (cannot exclude Bicuspid AoV)  . Syncope and collapse   . Bilateral lower extremity edema     Noncardiac.  Chronic. LE Venous dopplers - negative for DVT.; Echocardiogram January 2016: Normal EF with normal wall motion and valve function. Only grade 1 diastolic dysfunction. EF 60-65%. Mild MR  . Neuromuscular  disorder     MS  . Heart murmur   . Lymphedema     has legs wrapped at [facility]  . Cervical stenosis of spine   . Cancer 12-31-13    Right breast, 12:00, 1.5 cm, T1c,N0 invasive mammary carcinoma, triple negative.  . Breast cancer   . MS (multiple sclerosis)   . Irritable bowel     PAST SURGICAL HISTORY: Past Surgical History  Procedure Laterality Date  . Cholecystectomy    . Ankle surgery      Left  . Port-a-cath removal      right  . Anterior cervical decomp/discectomy fusion  11/17/2011    Procedure: ANTERIOR CERVICAL DECOMPRESSION/DISCECTOMY FUSION 2 LEVELS;  Surgeon: Maeola Harman, MD;  Location: [facility];  Service: Neurosurgery;  Laterality: N/A;  Cervical Five-Six Six-Seven Anterior cervical decompression/diskectomy/fusion  . Transthoracic echocardiogram  03/2013; 02/2014    a) Normal LV size and function with EF 60-65%.; Cannot exclude bicuspid aortic valve with mild AS and mild AI.; b) Normal EF with normal wall motion and valve function x Mild MR. G2 DD. EF 60-65%. Tricuspid AoV  . Lower extremity venous dopplers  Feb 27, 2013    No LE DVT  . Port a cath insertion Right 01/19/2010  . Colonoscopy  2014  . Upper gi endoscopy  2014  . Port-a-cath removal Right 09/03/2013    Procedure: REMOVAL PORT-A-CATH;  Surgeon: Fransisco Hertz, MD;  Location: [facility] OR;  Service: Vascular;  Laterality: Right;  . Breast biopsy Right 12-31-13  invasive mammary  . Breast surgery Right 02/03/2014    Right simple mastectomy with sentinel node biopsy.  . Mastectomy    . Ankle surgery      FAMILY HISTORY Family History  Problem Relation Age of Onset  . Cancer Father     skin  . Heart disease Father   . Heart attack Father   . Breast cancer Maternal Aunt 60  . Breast cancer Maternal Grandmother 74       ADVANCED DIRECTIVES:    HEALTH MAINTENANCE: Social History  Substance Use Topics  . Smoking status: Never Smoker   . Smokeless tobacco: Never Used  . Alcohol Use: No      Colonoscopy:  PAP:  Bone density:  Lipid panel:  Allergies  Allergen Reactions  . Fentanyl Nausea And Vomiting and Nausea Only    vomiting Was given in PACU x3 each time patient got sick  . Sulfa Antibiotics Hives and Other (See Comments)    Light headed, over heated    Current Outpatient Prescriptions  Medication Sig Dispense Refill  . amantadine (SYMMETREL) 100 MG capsule TAKE 3 CAPSULES BY MOUTH EVERY DAY 270 capsule 2  . baclofen (LIORESAL) 10 MG tablet TAKE 2 TABLETS BY MOUTH IN THE MORNING, 1 TABLET AT LUNCH, AND 2 TABLETS AT NIGHT AS DIRECTED 150 tablet 6  . Cholecalciferol (VITAMIN D3) 2000 UNITS capsule Take 2,000 Units by mouth daily.    . Cranberry 1000 MG CAPS Take by mouth daily.    Marland Kitchen GLUCOSAMINE-CHONDROITIN PO Take 1 tablet by mouth 3 (three) times daily. STRENGTH: 1500-1200    . ibuprofen (ADVIL,MOTRIN) 600 MG tablet TAKE 1 TABLET BY MOUTH EVERY 6 HOURS AS NEEDED 90 tablet 2  . interferon beta-1a (REBIF) 44 MCG/0.5ML SOSY injection Inject 44 mcg into the skin 3 (three) times a week.    . levETIRAcetam (KEPPRA) 500 MG tablet Take 1 tablet (500 mg total) by mouth 2 (two) times daily. 60 tablet 12  . lidocaine-prilocaine (EMLA) cream Apply 1 application topically once. Apply to port then cover with saran wrap 1-2 hours before chemotherapy    . loratadine (CLARITIN) 10 MG tablet Take 10 mg by mouth daily.    . Misc Natural Products (LEG VEIN & CIRCULATION) TABS Take by mouth daily.    . Multiple Vitamins-Minerals (MULTIVITAMIN PO) Take 1 tablet by mouth daily.    Marland Kitchen oxybutynin (DITROPAN-XL) 10 MG 24 hr tablet Take 2 tablets (20 mg total) by mouth daily. 180 tablet 1  . Oxycodone HCl 10 MG TABS Take 1 tablet (10 mg total) by mouth every 6 (six) hours as needed. 120 tablet 0  . prochlorperazine (COMPAZINE) 10 MG tablet Take 10 mg by mouth every 6 (six) hours as needed.     . fentaNYL (DURAGESIC - DOSED MCG/HR) 25 MCG/HR patch Place 1 patch (25 mcg total) onto the skin  every 3 (three) days. 10 patch 0   No current facility-administered medications for this visit.    OBJECTIVE: Filed Vitals:   10/15/14 1222  BP: 96/65  Pulse: 73  Temp: 96.6 F (35.9 C)  Resp: 16     There is no weight on file to calculate BMI.    ECOG FS:1 - Symptomatic but completely ambulatory  General: Well-developed, well-nourished, no acute distress. Sitting in a wheelchair. Eyes: anicteric sclera. Lungs: Clear to auscultation bilaterally. Heart: Regular rate and rhythm. No rubs, murmurs, or gallops. Abdomen: Soft, nontender, nondistended. No organomegaly noted, normoactive bowel sounds. Musculoskeletal: Bilateral lower extremity  lymphedema. Neuro: Alert, answering all questions appropriately. Cranial nerves grossly intact. Skin: No rashes or petechiae noted. Psych: Normal affect.   LAB RESULTS:  Lab Results  Component Value Date   NA 136 07/17/2014   K 3.6 07/17/2014   CL 104 07/17/2014   CO2 28 07/17/2014   GLUCOSE 93 07/17/2014   BUN 21* 07/17/2014   CREATININE 0.46 07/17/2014   CALCIUM 9.1 07/17/2014   PROT 7.4 07/17/2014   ALBUMIN 4.5 07/17/2014   AST 19 07/17/2014   ALT 16 07/17/2014   ALKPHOS 73 07/17/2014   BILITOT 0.5 07/17/2014   GFRNONAA >60 07/17/2014   GFRAA >60 07/17/2014    Lab Results  Component Value Date   WBC 3.3* 07/17/2014   NEUTROABS 2.0 07/17/2014   HGB 11.5* 07/17/2014   HCT 35.0 07/17/2014   MCV 92.7 07/17/2014   PLT 130* 07/17/2014     STUDIES: Mr Jeri Cos IT Contrast  2014-10-17     [facility] [address] [place], [place] [address] [phone]  NEUROIMAGING REPORT   STUDY DATE: 09/30/2014 PATIENT NAME: AANIYAH STROHM DOB: February 04, 1961 MRN: [id number]  EXAM: MRI Brain with and without contrast  ORDERING CLINICIAN: Andrey Spearman M.D. CLINICAL HISTORY: 53 year old woman with multiple sclerosis COMPARISON FILMS: MRI of the brain 08/21/2013  TECHNIQUE:MRI of the brain with and without contrast was  obtained  utilizing 5 mm axial slices with T1, T2, T2 flair, SWI and diffusion  weighted views.  T1 sagittal, T2 coronal and postcontrast views in the  axial and coronal plane were obtained. CONTRAST: 18 ml [employer] IMAGING SITE: [facility], [place].  FINDINGS: On sagittal images, the spinal cord is imaged caudally to C3 and  is normal in caliber.   The contents of the posterior fossa are of normal  size and position.   The pituitary gland and optic chiasm appear normal.     Brain volume appears normal.   The ventricles are normal in size and  without distortion.  There are no abnormal extra-axial collections of  fluid.    A small T2/FLAIR focus is noted in the right cerebellar hemisphere, right  inferior pons and mid pons. Other T2/FLAIR foci are noted within the  thalami.   In the hemispheres, there are multiple single and confluent  T2/FLAIR hyperintense foci. Most of these are in the periventricular white  matter. Others are in the deep white matter and the juxtacortical white  matter. Many of the periventricular foci are radially oriented to the  ventricles. Some of the foci are hypointense on T1-weighted images.  The  orbits appear normal.   The VIIth/VIIIth nerve complex appears normal.   The mastoid air cells appear normal.  The paranasal sinuses appear normal.   Flow voids are identified within the major intracerebral arteries.     Diffusion weighted images are normal.  Susceptibility weighted images are  normal.   After the infusion of contrast material, a normal enhancement  pattern is noted.  The study was compared to the MRI of the brain dated 08/21/2013. There is  no definite interval change.    October 17, 2014    This is an abnormal MRI of the brain with and without  contrast showing 2/flare foci in the right cervical hemisphere, pons,  thalami and cerebral hemispheres in a pattern and configuration consistent  with the diagnosis of multiple sclerosis. None of the foci enhances  after  contrast administration. There are no acute findings. When compared to  the  MRI dated 08/21/2013, there has been no interval change.    INTERPRETING PHYSICIAN:  Richard A. 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 BRCA negative.  PLAN:    1. Breast cancer: Because patient had a full mastectomy, she did not require adjuvant XRT. Given the fact that she is a triple negative cancer, she will not require an aromatase inhibitor. Given her difficulties with Taxol and multiple sclerosis, treatment was discontinued after a total of 6 of 12 cycles of Taxol. No further intervention is needed at this time. Return to clinic as previously scheduled for laboratory work and further evaluation.  2. Multiple sclerosis: Patient has reinitiated treatments. 3. Lymphedema: Continue treatment and wraps as prescribed.  4. Peripheral neuropathy: Monitor. 5. Pain: Patient has fentanyl listed as an allergy, but this was nausea secondary to IV fentanyl given postoperatively. Will give patient 25 g fentanyl patch along with 10 mg oxycodone for breakthrough pain. Return to clinic as above. Monitor.  Her primary neurologist is Dr. Andrey Spearman.  phone [phone], fax [phone].   Patient expressed understanding and was in agreement with this plan. She also understands that She can call clinic at any time with any questions, concerns, or complaints.   Breast cancer   Staging form: Breast, AJCC 7th Edition     Pathologic stage from 05/24/2014: Stage IA (T1c, N0, cM0) - Signed by Lloyd Huger, MD on 05/24/2014   Lloyd Huger, MD   10/21/2014 2:30 PM

## 2014-10-21 NOTE — Telephone Encounter (Signed)
Patient was informed paperwork was complete and has been faxed to [facility] and was transferred to the front so they can set up for an appt to f/u with Dr. Nadine Counts

## 2014-10-22 ENCOUNTER — Encounter: Payer: Self-pay | Admitting: Occupational Therapy

## 2014-10-22 ENCOUNTER — Ambulatory Visit: Payer: Medicare Other | Attending: Family Medicine | Admitting: Occupational Therapy

## 2014-10-22 DIAGNOSIS — M6281 Muscle weakness (generalized): Secondary | ICD-10-CM | POA: Insufficient documentation

## 2014-10-22 DIAGNOSIS — R460 Very low level of personal hygiene: Secondary | ICD-10-CM | POA: Insufficient documentation

## 2014-10-22 DIAGNOSIS — I890 Lymphedema, not elsewhere classified: Secondary | ICD-10-CM | POA: Diagnosis not present

## 2014-10-22 DIAGNOSIS — Z741 Need for assistance with personal care: Secondary | ICD-10-CM

## 2014-10-23 NOTE — Therapy (Signed)
[place] MAIN [facility] SERVICES [address] [place], [place], [address] Phone: [phone]   Fax:  [phone]  Occupational Therapy Evaluation  Patient Details  Name: Rebecca [name] MRN: [id number] Date of Birth: 09-23-1960 Referring Provider:  Bobetta Lime, MD  Encounter Date: 10/22/2014      OT End of Session - 10/23/14 1608    Visit Number 10   Number of Visits 36   Date for OT Re-Evaluation 01/21/15   Authorization Type Medicare G code visit 10   OT Start Time 1040   OT Stop Time 1137   OT Time Calculation (min) 57 min   Activity Tolerance Patient tolerated treatment well;No increased pain   Behavior During Therapy [facility] for tasks assessed/performed      Past Medical History  Diagnosis Date  . Herpes zoster   . Multiple sclerosis 2001    Walks from room to room @ home; but Wheelchair when going out.  . Closed head injury with brief loss of consciousness   . Seizures     Takes Keppra  . IBS (irritable bowel syndrome)     no longer  . Bacterial endocarditis     History of .  Marland Kitchen Aortic valve disease     Mild AS / AI (cannot exclude Bicuspid AoV)  . Syncope and collapse   . Bilateral lower extremity edema     Noncardiac.  Chronic. LE Venous dopplers - negative for DVT.; Echocardiogram January 2016: Normal EF with normal wall motion and valve function. Only grade 1 diastolic dysfunction. EF 60-65%. Mild MR  . Neuromuscular disorder     MS  . Heart murmur   . Lymphedema     has legs wrapped at [facility]  . Cervical stenosis of spine   . Cancer 12-31-13    Right breast, 12:00, 1.5 cm, T1c,N0 invasive mammary carcinoma, triple negative.  . Breast cancer   . MS (multiple sclerosis)   . Irritable bowel     Past Surgical History  Procedure Laterality Date  . Cholecystectomy    . Ankle surgery      Left  . Port-a-cath removal      right  . Anterior cervical decomp/discectomy fusion  11/17/2011    Procedure: ANTERIOR CERVICAL  DECOMPRESSION/DISCECTOMY FUSION 2 LEVELS;  Surgeon: Erline Levine, MD;  Location: [place] [facility];  Service: Neurosurgery;  Laterality: N/A;  Cervical Five-Six Six-Seven Anterior cervical decompression/diskectomy/fusion  . Transthoracic echocardiogram  03/2013; 02/2014    a) Normal LV size and function with EF 60-65%.; Cannot exclude bicuspid aortic valve with mild AS and mild AI.; b) Normal EF with normal wall motion and valve function x Mild MR. G2 DD. EF 60-65%. Tricuspid AoV  . Lower extremity venous dopplers  Feb 27, 2013    No LE DVT  . Port a cath insertion Right 01/19/2010  . Colonoscopy  2014  . Upper gi endoscopy  2014  . Port-a-cath removal Right 09/03/2013    Procedure: REMOVAL PORT-A-CATH;  Surgeon: Conrad Shrewsbury, MD;  Location: [place];  Service: Vascular;  Laterality: Right;  . Breast biopsy Right 12-31-13    invasive mammary  . Breast surgery Right 02/03/2014    Right simple mastectomy with sentinel node biopsy.  . Mastectomy    . Ankle surgery      There were no vitals filed for this visit.  Visit Diagnosis:  Lymphedema - Plan: Ot plan of care cert/re-cert  Muscle weakness (generalized) - Plan: Ot plan of  care cert/re-cert  Self-care deficit for bathing and hygiene - Plan: Ot plan of care cert/re-cert  Self-care deficit for dressing and grooming - Plan: Ot plan of care cert/re-cert      Subjective Assessment - 10/22/14 1548    Subjective  Patient reports her husband has had several health problems over the summer including fractured ankle, kidney stones, falls and a problem with his toe.  He is her only source of transportation to get to therapy so when he is sick she cannot attend.  They rent an apartment in a old home and the husband has to get out an aluminum ramp to get her outside of the house and put it back when he is done, each time she leaves the house.     Patient is accompained by: Family member   Pertinent History Patient had been seen in the past for lymphedema  treatment and had to stop coming for a couple of months due to issues with her husband's health.     Patient Stated Goals Patient wants to get her legs back down to a smaller size, be able to transfer and walk better as well as be able to help more with her self care tasks, especially toileting.   Currently in Pain? No/denies   Pain Score 0-No pain   Multiple Pain Sites No           [facility] OT Assessment - 10/23/14 1552    Assessment   Diagnosis lymphedema BLE          LYMPHEDEMA/ONCOLOGY QUESTIONNAIRE - 10/23/14 1605    Right Lower Extremity Lymphedema   At Midpatella/Popliteal Crease 51.5 cm   30 cm Proximal to Floor at Lateral Plantar Foot 55.6 cm   20 cm Proximal to Floor at Lateral Plantar Foot 42._0 cm Proximal to Floor at Lateral Malleoli 25 cm   5 cm Proximal to 1st MTP Joint 22.1 cm   Across MTP Joint 24.6 cm   Around Proximal Great Toe 10.3 cm   Other 31.9   Left Lower Extremity Lymphedema   At Midpatella/Popliteal Crease 50.7 cm   30 cm Proximal to Floor at Lateral Plantar Foot 45.6 cm   20 cm Proximal to Floor at Lateral Plantar Foot 33.5 cm   10 cm Proximal to Floor at Lateral Malleoli 24.6 cm   5 cm Proximal to 1st MTP Joint 22.6 cm   Across MTP Joint 24.1 cm   Around Proximal Great Toe 10.3 cm   Other 31.4                       OT Education - 10/22/14 1607    Education provided Yes   Education Details lymphedema, treatment plan, compression bandaging.   Person(s) Educated Patient;Spouse   Methods Explanation;Demonstration;Verbal cues   Comprehension Verbal cues required;Returned demonstration;Verbalized understanding             OT Long Term Goals - 10/22/14 1618    OT LONG TERM GOAL #1   Title . Patient to complete toileting with minimal assistance of one person in 12 weeks   Baseline min to mod assist, level flucuates daily   Time 12   Period Weeks   Status On-going   OT LONG TERM GOAL #2   Title Patient to complete bed  to wheelchair transfer with minimal assist in 12 weeks.   Baseline mod to max assist, level flucuates   Time 12   Period Weeks  Status On-going   OT LONG TERM GOAL #3   Title Optimal edema control will be achieved, patient and caregiver to be independent in wearing of appropriate compression garments in 12 weeks.    Baseline increased edema and requiring compression bandaging to decongest legs.   Time 12   Period Weeks   Status On-going   OT LONG TERM GOAL #4   Title Husband to be independent with donning and doffing of compression daytime and night time garments in 12 weeks   Baseline unable    Time 12   Period Weeks   Status On-going   OT LONG TERM GOAL #5   Title Optimal edema control will be achieved and patient will be measured for and appropriate compression garment in 12 weeks   Baseline not yet decongested to be measured for custom garments   Time 12   Period Weeks   Status On-going   OT LONG TERM GOAL #6   Title Patient and family to be independent in skincare, HEP and self bandaging in 12 weeks   Baseline minimal cues and assist, familiar with compression bandaging from the past.   Time 12   Period Weeks   Status Partially Met   OT LONG TERM GOAL #7   Title  Patient will show a decrease in circumference in affected LE at toes 2cm , foot 3 cm ; ankle and calf 5cm, thighs 5 cm to fit in appropriate compression garments and in shoe in 12 weeks   Baseline see flowsheet for circumferential measurements   Time 12   Period Weeks   Status On-going               Plan - 11/12/14 1609    Clinical Impression Statement Patient is a 54 yo female diagnosed with lymphedema, MS and more recently with breast cancer.  She has been seen in the past in this clinic for lymphedema treatment but after starting her treatment for breast cancer, she had increases in her lymphedema of bilateral lower extremtiies and was no longer able to fit into her custom compression garments.  She  returned to treatment for her lymphedema but had limitations due to ongoing chemo treatments and was then limited by lack of transportation due to husbands recent health issues.  She presents with increased edema in bilateral lower extremities and would benefit from a comprehensive lymphedema treatment approach to decongest her lower extremities and prepare her legs for fitting of new custom compression garments.  By managing her lymphedema, it is hopeful she will improve her mobility and transfer status as well as impact her ADLs.     Pt will benefit from skilled therapeutic intervention in order to improve on the following deficits (Retired) Difficulty walking;Impaired flexibility;Increased edema;Decreased skin integrity;Decreased strength;Decreased mobility;Decreased balance;Decreased activity tolerance   Rehab Potential Good   Clinical Impairments Affecting Rehab Potential Past chemo treatments, was on hold for 2 months due to not being able to get out for appts due to husbands health issues.   OT Frequency 3x / week   OT Duration 12 weeks   OT Treatment/Interventions Self-care/ADL training;Therapeutic exercise;Functional Mobility Training;Patient/family education;Manual Therapy;Manual lymph drainage;DME and/or AE instruction;Compression bandaging   OT Home Exercise Plan Pt has exercises for lower extremity to be performed at home in compression.   Consulted and Agree with Plan of Care Patient;Family member/caregiver   Family Member Consulted spouse- discussed and agreed on POC          G-Codes - 11/12/2014 1611  Functional Assessment Tool Used clinical judgment   Functional Limitation Self care   Self Care Current Status [phone]) At least 80 percent but less than 100 percent impaired, limited or restricted   Self Care Goal Status (T0160) At least 40 percent but less than 60 percent impaired, limited or restricted      Problem List Patient Active Problem List   Diagnosis Date Noted  . MS  (multiple sclerosis) 09/16/2014  . Hematoma complicating a procedure 03/06/2014  . Breast cancer 01/10/2014  . SOB (shortness of breath) 03/01/2013  . Bilateral lower extremity edema   . Aortic insufficiency   . Syncope 06/08/2011  . Herpes zoster 06/08/2011  . Multiple sclerosis    Amy Oneita Jolly, OTR/L, CLT Lovett,Amy 10/23/2014, 4:52 PM  [place] MAIN [facility] SERVICES [address] [place], [place], [address] Phone: [phone]   Fax:  [phone]

## 2014-10-24 ENCOUNTER — Ambulatory Visit: Payer: Self-pay | Admitting: Family Medicine

## 2014-10-24 ENCOUNTER — Other Ambulatory Visit: Payer: Self-pay | Admitting: Diagnostic Neuroimaging

## 2014-10-24 ENCOUNTER — Other Ambulatory Visit: Payer: Self-pay | Admitting: Family Medicine

## 2014-10-24 ENCOUNTER — Ambulatory Visit (INDEPENDENT_AMBULATORY_CARE_PROVIDER_SITE_OTHER): Payer: Medicare Other | Admitting: Family Medicine

## 2014-10-24 ENCOUNTER — Encounter: Payer: Self-pay | Admitting: Family Medicine

## 2014-10-24 VITALS — BP 108/70 | HR 67 | Temp 97.5°F | Resp 14 | Wt 191.0 lb

## 2014-10-24 DIAGNOSIS — R011 Cardiac murmur, unspecified: Secondary | ICD-10-CM | POA: Insufficient documentation

## 2014-10-24 DIAGNOSIS — Z23 Encounter for immunization: Secondary | ICD-10-CM | POA: Insufficient documentation

## 2014-10-24 DIAGNOSIS — Z9011 Acquired absence of right breast and nipple: Secondary | ICD-10-CM | POA: Insufficient documentation

## 2014-10-24 DIAGNOSIS — G35 Multiple sclerosis: Secondary | ICD-10-CM | POA: Diagnosis not present

## 2014-10-24 DIAGNOSIS — C50911 Malignant neoplasm of unspecified site of right female breast: Secondary | ICD-10-CM

## 2014-10-24 DIAGNOSIS — I890 Lymphedema, not elsewhere classified: Secondary | ICD-10-CM | POA: Diagnosis not present

## 2014-10-24 DIAGNOSIS — G40209 Localization-related (focal) (partial) symptomatic epilepsy and epileptic syndromes with complex partial seizures, not intractable, without status epilepticus: Secondary | ICD-10-CM

## 2014-10-24 DIAGNOSIS — R600 Localized edema: Secondary | ICD-10-CM

## 2014-10-24 DIAGNOSIS — Z0000 Encounter for general adult medical examination without abnormal findings: Secondary | ICD-10-CM | POA: Diagnosis not present

## 2014-10-24 DIAGNOSIS — D259 Leiomyoma of uterus, unspecified: Secondary | ICD-10-CM | POA: Insufficient documentation

## 2014-10-24 MED ORDER — OXYBUTYNIN CHLORIDE ER 10 MG PO TB24: 20.0000 mg | ORAL_TABLET | Freq: Every day | ORAL | Status: DC

## 2014-10-24 NOTE — Progress Notes (Signed)
Name: Rebecca [name]   MRN: [id number]    DOB: May 09, 1960   Date:10/24/2014       Progress Note  Subjective  Chief Complaint  Chief Complaint  Patient presents with  . Follow-up    HPI  Patient is here today for a Female Medicare Wellness Visit:  Rebecca [name] is a 54 year old female with chronic medical conditions including chronic lymphedema of bilateral legs, seizure disorder, multiple sclerosis, recent right breast cancer with mastectomy and chemo therapy (adriamycin, cytoxan, taxol; couldn't complete the taxol cycles). She is here today for routine follow up with no acute concerns or complaints.  Recently restarted with outpatient compression therapy of lower legs to help reduce swelling from chronic lymphedema.  She was not able to keep up with therapy over the Summer as her husband had his own health issues and could not provide safe transportation for [place].  The patient continues to follow with her specialists: outpatient rehab therapy for compression dressings of LE, Dr. Andrey Spearman with [facility] and Dr. [name] with [facility]. Her pain is being addressed well, plan to transition from oral oxycodone 10 mg tablets to fentanyl patch which she reports having no side effects to. She did have significant constipation with the oral opioids and is hoping to rely on other therapies for pain control. Islay continues to ambulate with assistance while at her home but uses her wheel chair when out. No recent major falls. No seizure like activities. Re-started Rebif therapy and continues with all other medications as prescribed. She has not had a bone density scan and today we discussed her risks and other preventative measures for the near future.    Past Medical History  Diagnosis Date  . Herpes zoster   . Multiple sclerosis 2001    Walks from room to room @ home; but Wheelchair when going out.  . Closed head injury with brief loss of  consciousness   . Seizures     Takes Keppra  . IBS (irritable bowel syndrome)     no longer  . Bacterial endocarditis     History of .  Marland Kitchen Aortic valve disease     Mild AS / AI (cannot exclude Bicuspid AoV)  . Syncope and collapse   . Bilateral lower extremity edema     Noncardiac.  Chronic. LE Venous dopplers - negative for DVT.; Echocardiogram January 2016: Normal EF with normal wall motion and valve function. Only grade 1 diastolic dysfunction. EF 60-65%. Mild MR  . Neuromuscular disorder     MS  . Heart murmur   . Lymphedema     has legs wrapped at [facility]  . Cervical stenosis of spine   . Cancer 12-31-13    Right breast, 12:00, 1.5 cm, T1c,N0 invasive mammary carcinoma, triple negative.  . Breast cancer   . MS (multiple sclerosis)   . Irritable bowel     Past Surgical History  Procedure Laterality Date  . Cholecystectomy    . Ankle surgery      Left  . Port-a-cath removal      right  . Anterior cervical decomp/discectomy fusion  11/17/2011    Procedure: ANTERIOR CERVICAL DECOMPRESSION/DISCECTOMY FUSION 2 LEVELS;  Surgeon: Erline Levine, MD;  Location: [facility];  Service: Neurosurgery;  Laterality: N/A;  Cervical Five-Six Six-Seven Anterior cervical decompression/diskectomy/fusion  . Transthoracic echocardiogram  03/2013; 02/2014    a) Normal LV size and function with EF 60-65%.; Cannot exclude bicuspid aortic valve  with mild AS and mild AI.; b) Normal EF with normal wall motion and valve function x Mild MR. G2 DD. EF 60-65%. Tricuspid AoV  . Lower extremity venous dopplers  Feb 27, 2013    No LE DVT  . Port a cath insertion Right 01/19/2010  . Colonoscopy  2014  . Upper gi endoscopy  2014  . Port-a-cath removal Right 09/03/2013    Procedure: REMOVAL PORT-A-CATH;  Surgeon: Conrad West Elizabeth, MD;  Location: [place];  Service: Vascular;  Laterality: Right;  . Breast biopsy Right 12-31-13    invasive mammary  . Breast surgery Right 02/03/2014    Right simple mastectomy with  sentinel node biopsy.  . Mastectomy    . Ankle surgery      Family History  Problem Relation Age of Onset  . Cancer Father     skin  . Heart disease Father   . Heart attack Father   . Breast cancer Maternal Aunt 60  . Breast cancer Maternal Grandmother 74    Social History   Social History  . Marital Status: Married    Spouse Name: don  . Number of Children: 0  . Years of Education: 12   Occupational History  . disability    Social History Main Topics  . Smoking status: Never Smoker   . Smokeless tobacco: Never Used  . Alcohol Use: No  . Drug Use: No  . Sexual Activity:    Partners: Male   Other Topics Concern  . Not on file   Social History Narrative   She is married. Recently moved back to [place] after being in [place] for some time. She is accompanied by her husband and aunt.   Never smoked. Never used alcohol.     Current outpatient prescriptions:  .  fentaNYL (DURAGESIC - DOSED MCG/HR) 25 MCG/HR patch, Place 25 mcg onto the skin every 3 (three) days., Disp: , Rfl:  .  amantadine (SYMMETREL) 100 MG capsule, TAKE 3 CAPSULES BY MOUTH EVERY DAY, Disp: 270 capsule, Rfl: 2 .  baclofen (LIORESAL) 10 MG tablet, TAKE 2 TABLETS BY MOUTH IN THE MORNING, 1 TABLET AT LUNCH, AND 2 TABLETS AT NIGHT AS DIRECTED, Disp: 150 tablet, Rfl: 6 .  Cholecalciferol (VITAMIN D3) 2000 UNITS capsule, Take 2,000 Units by mouth daily., Disp: , Rfl:  .  Cranberry 1000 MG CAPS, Take by mouth daily., Disp: , Rfl:  .  GLUCOSAMINE-CHONDROITIN PO, Take 1 tablet by mouth 3 (three) times daily. STRENGTH: 1500-1200, Disp: , Rfl:  .  ibuprofen (ADVIL,MOTRIN) 600 MG tablet, TAKE 1 TABLET BY MOUTH EVERY 6 HOURS AS NEEDED, Disp: 90 tablet, Rfl: 2 .  interferon beta-1a (REBIF) 44 MCG/0.5ML SOSY injection, Inject 44 mcg into the skin 3 (three) times a week., Disp: , Rfl:  .  levETIRAcetam (KEPPRA) 500 MG tablet, Take 1 tablet (500 mg total) by mouth 2 (two) times daily., Disp: 60 tablet, Rfl:  12 .  lidocaine-prilocaine (EMLA) cream, Apply 1 application topically once. Apply to port then cover with saran wrap 1-2 hours before chemotherapy, Disp: , Rfl:  .  loratadine (CLARITIN) 10 MG tablet, Take 10 mg by mouth daily., Disp: , Rfl:  .  Misc Natural Products (LEG VEIN & CIRCULATION) TABS, Take by mouth daily., Disp: , Rfl:  .  Multiple Vitamins-Minerals (MULTIVITAMIN PO), Take 1 tablet by mouth daily., Disp: , Rfl:  .  oxybutynin (DITROPAN-XL) 10 MG 24 hr tablet, Take 2 tablets (20 mg total) by mouth daily., Disp:  180 tablet, Rfl: 1 .  Oxycodone HCl 10 MG TABS, Take 1 tablet (10 mg total) by mouth every 6 (six) hours as needed., Disp: 120 tablet, Rfl: 0 .  prochlorperazine (COMPAZINE) 10 MG tablet, Take 10 mg by mouth every 6 (six) hours as needed. , Disp: , Rfl:   Allergies  Allergen Reactions  . Fentanyl Nausea And Vomiting and Nausea Only    vomiting Was given in PACU x3 each time patient got sick vomiting Was given in PACU x3 each time patient got sick  . Sulfa Antibiotics Hives and Other (See Comments)    Light headed, over heated    Fall Risk: Fall Risk  10/24/2014  Falls in the past year? No  Risk for fall due to : -    Depression screen [facility] 2/9 10/24/2014 06/09/2011  Decreased Interest 0 0  Down, Depressed, Hopeless 0 0  PHQ - 2 Score 0 0    Functional Status Survey: Is the patient deaf or have difficulty hearing?: No Does the patient have difficulty seeing, even when wearing glasses/contacts?: No Does the patient have difficulty concentrating, remembering, or making decisions?: No Does the patient have difficulty walking or climbing stairs?: Yes Does the patient have difficulty dressing or bathing?: Yes Does the patient have difficulty doing errands alone such as visiting a doctor's office or shopping?: Yes   ROS  CONSTITUTIONAL: No significant weight changes, fever, chills, weakness or fatigue.  HEENT:  - Eyes: No visual changes.  - Ears: No auditory  changes. No pain.  - Nose: No sneezing, congestion, runny nose. - Throat: No sore throat. No changes in swallowing. SKIN: No rash or itching.  CARDIOVASCULAR: No chest pain, chest pressure or chest discomfort. No palpitations. Yes chronic swelling of lower extremities.  RESPIRATORY: No shortness of breath, cough or sputum.  GASTROINTESTINAL: No anorexia, nausea, vomiting. No changes in bowel habits. No abdominal pain or blood.  GENITOURINARY: No dysuria. No frequency. No discharge.  NEUROLOGICAL: No headache, dizziness, syncope, paralysis, ataxia, numbness or tingling in the extremities. No memory changes. No change in bowel or bladder control.  MUSCULOSKELETAL: Yes joint pain. Yes muscle pain. HEMATOLOGIC: No anemia, bleeding or bruising.  LYMPHATICS: No enlarged lymph nodes.  PSYCHIATRIC: No change in mood. No change in sleep pattern.  ENDOCRINOLOGIC: No reports of sweating, cold or heat intolerance. No polyuria or polydipsia.   Objective  Filed Vitals:   10/24/14 1059  BP: 108/70  Pulse: 67  Temp: 97.5 F (36.4 C)  TempSrc: Oral  Resp: 14  Weight: 191 lb (86.637 kg)  SpO2: 98%   Body mass index is 32.77 kg/(m^2).  Physical Exam  Constitutional: Patient appears well-developed and well-nourished. In no distress. Sitting in her wheel chair today. HEENT:  - Head: Normocephalic and atraumatic. Hair has started growing back on scalp and eyebrows. - Ears: Bilateral TMs gray, no erythema or effusion - Nose: Nasal mucosa moist - Mouth/Throat: Oropharynx is clear and moist. No tonsillar hypertrophy or erythema. No post nasal drainage.  - Eyes: Conjunctivae clear, EOM movements normal. PERRLA. No scleral icterus.  Neck: Normal range of motion. Neck supple. No JVD present. No thyromegaly present.  Cardiovascular: Normal rate, regular rhythm and stable 2/6 SEM. Pulmonary/Chest: Effort normal and breath sounds normal. No respiratory distress. Musculoskeletal: Normal range of motion  bilateral UE. Peripheral vascular: Bilateral LE chronic lymphedema non pitting, legs wrapped in compression therapy difficult to gauge pitting or measure calf accurately today. Neurological: CN II-XII grossly intact with no focal deficits.  Alert and oriented to person, place, and time.  Motor: Normal bulk and tone. Full strength in the upper extremities 5/5; Bilateral lower extremities (2/5 prox, 3/5 distal).  Sensory: Intact and symmetric to light touch.  Coordination: No dysmetria or dysdiadochokinesia  Reflexes: Deep tendon reflexes in the upper extremity are present and symmetric although LE exam is difficult due to chronic lymphedema.  Gait and Station: Wheelchair bound out of the home. Skin: Skin is warm and dry. No rash noted. No erythema.  Psychiatric: Patient has a normal mood and affect. Behavior is normal in office today. Judgment and thought content normal in office today.   Assessment & Plan  1. Medicare annual wellness visit, subsequent Functional ability/safety issues: Issues addressed  Hearing issues: Addressed  Activities of daily living: Discussed Home safety issues: Issues addressed  End Of Life Planning: Offered verbal information regarding advanced directives, healthcare power of attorney.  Preventative care, Health maintenance, Preventative health measures discussed.  Preventative screenings discussed today: lab work, colonoscopy, PAP, mammogram, DEXA.  PRINTOUT GIVEN REGARDING DEXA SCAN.  Low Dose CT Chest recommended if Age 11-80 years, 30 pack-year currently smoking OR have quit w/in 15years.   Lifestyle risk factor issued reviewed: Diet, exercise, weight management, advised patient smoking is not healthy, nutrition/diet.  Preventative health measures discussed (5-10 year plan).  Reviewed and recommended vaccinations: - Pneumovax  - Prevnar  - Annual Influenza - Zostavax - Tdap   Depression screening: Done Fall risk screening: Done Discuss  ADLs/IADLs: Done  Current medical providers: See HPI  Other health risk factors identified this visit: No other issues Cognitive impairment issues: None identified  All above discussed with patient. Appropriate education, counseling and referral will be made based upon the above.    2. Partial symptomatic epilepsy with complex partial seizures, not intractable, without status epilepticus Clinically stable findings based on clinical exam and on review of any pertinent results. Recommended to patient that they continue their current regimen with regular follow ups.  3. MS (multiple sclerosis) Clinically stable findings based on clinical exam and on review of any pertinent results. Recommended to patient that they continue their current regimen with regular follow ups.   4. Lymphedema of both lower extremities Symptoms wax and wane with compression therapy, she has started back up with lymphedema clinic.   5. Breast cancer, right Reviewed relevant specialist notes and will continue to follow along with plan of care.  6. Need for immunization against influenza  - Flu Vaccine QUAD 36+ mos PF IM (Fluarix & Fluzone Quad PF)

## 2014-10-24 NOTE — Telephone Encounter (Signed)
Patient is completely out of Oxybutynin and is requesting that a refill be sent to [facility] today

## 2014-10-24 NOTE — Progress Notes (Deleted)
Name: Rebecca [name]   MRN: [id number]    DOB: [birth year]   Date:10/24/2014       Progress Note  Subjective  Chief Complaint  Chief Complaint  Patient presents with  . Follow-up    HPI  ***  Patient Active Problem List   Diagnosis Date Noted  . Heart murmur 10/24/2014  . 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 Lymphedema of both lower extremities 10/24/2014  . History of right mastectomy 10/24/2014  . MS (multiple sclerosis) 09/16/2014  . Hematoma complicating a procedure 03/06/2014  . Breast cancer 01/10/2014  . SOB (shortness of breath) 03/01/2013  . Bilateral lower extremity edema   . 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 Herpes zoster 06/08/2011  . Multiple sclerosis     Social History  Substance Use Topics  . Smoking status: Never Smoker   . Smokeless tobacco: Never Used  . Alcohol Use: No     Current outpatient prescriptions:  .  fentaNYL (DURAGESIC - DOSED MCG/HR) 25 MCG/HR patch, Place 25 mcg onto the skin every 3 (three) days., Disp: , Rfl:  .  amantadine (SYMMETREL) 100 MG capsule, TAKE 3 CAPSULES BY MOUTH EVERY DAY, Disp: 270 capsule, Rfl: 2 .  baclofen (LIORESAL) 10 MG tablet, TAKE 2 TABLETS BY MOUTH IN THE MORNING, 1 TABLET AT LUNCH, AND 2 TABLETS AT NIGHT AS DIRECTED, Disp: 150 tablet, Rfl: 6 .  Cholecalciferol (VITAMIN D3) 2000 UNITS capsule, Take 2,000 Units by mouth daily., Disp: , Rfl:  .  Cranberry 1000 MG CAPS, Take by mouth daily., Disp: , Rfl:  .  GLUCOSAMINE-CHONDROITIN PO, Take 1 tablet by mouth 3 (three) times daily. STRENGTH: 1500-1200, Disp: , Rfl:  .  ibuprofen (ADVIL,MOTRIN) 600 MG tablet, TAKE 1 TABLET BY MOUTH EVERY 6 HOURS AS NEEDED, Disp: 90 tablet, Rfl: 2 .  interferon beta-1a (REBIF) 44 MCG/0.5ML SOSY injection, Inject 44 mcg into the skin 3 (three) times a week., Disp: , Rfl:  .  levETIRAcetam (KEPPRA) 500 MG tablet, Take 1 tablet (500 mg total) by mouth 2 (two) times daily., Disp: 60 tablet, Rfl: 12 .  lidocaine-prilocaine (EMLA)  cream, Apply 1 application topically once. Apply to port then cover with saran wrap 1-2 hours before chemotherapy, Disp: , Rfl:  .  loratadine (CLARITIN) 10 MG tablet, Take 10 mg by mouth daily., Disp: , Rfl:  .  Misc Natural Products (LEG VEIN & CIRCULATION) TABS, Take by mouth daily., Disp: , Rfl:  .  Multiple Vitamins-Minerals (MULTIVITAMIN PO), Take 1 tablet by mouth daily., Disp: , Rfl:  .  oxybutynin (DITROPAN-XL) 10 MG 24 hr tablet, Take 2 tablets (20 mg total) by mouth daily., Disp: 180 tablet, Rfl: 1 .  Oxycodone HCl 10 MG TABS, Take 1 tablet (10 mg total) by mouth every 6 (six) hours as needed., Disp: 120 tablet, Rfl: 0 .  prochlorperazine (COMPAZINE) 10 MG tablet, Take 10 mg by mouth every 6 (six) hours as needed. , Disp: , Rfl:   Past Surgical History  Procedure Laterality Date  . Cholecystectomy    . Ankle surgery      Left  . Port-a-cath removal      right  . Anterior cervical decomp/discectomy fusion  11/17/2011    Procedure: ANTERIOR CERVICAL DECOMPRESSION/DISCECTOMY FUSION 2 LEVELS;  Surgeon: Erline Levine, MD;  Location: [facility];  Service: Neurosurgery;  Laterality: N/A;  Cervical Five-Six Six-Seven Anterior cervical decompression/diskectomy/fusion  . Transthoracic echocardiogram  03/2013; 02/2014    a) Normal LV size and function with  EF 60-65%.; Cannot exclude bicuspid aortic valve with mild AS and mild AI.; b) Normal EF with normal wall motion and valve function x Mild MR. G2 DD. EF 60-65%. Tricuspid AoV  . Lower extremity venous dopplers  Feb 27, 2013    No LE DVT  . Port a cath insertion Right 01/19/2010  . Colonoscopy  2014  . Upper gi endoscopy  2014  . Port-a-cath removal Right 09/03/2013    Procedure: REMOVAL PORT-A-CATH;  Surgeon: Conrad Moffat, MD;  Location: [place];  Service: Vascular;  Laterality: Right;  . Breast biopsy Right 12-31-13    invasive mammary  . Breast surgery Right 02/03/2014    Right simple mastectomy with sentinel node biopsy.  . Mastectomy     . Ankle surgery      Family History  Problem Relation Age of Onset  . Cancer Father     skin  . Heart disease Father   . Heart attack Father   . Breast cancer Maternal Aunt 60  . Breast cancer Maternal Grandmother 74    Allergies  Allergen Reactions  . Fentanyl Nausea And Vomiting and Nausea Only    vomiting Was given in PACU x3 each time patient got sick vomiting Was given in PACU x3 each time patient got sick  . Sulfa Antibiotics Hives and Other (See Comments)    Light headed, over heated     Review of Systems  CONSTITUTIONAL: No significant weight changes, fever, chills, weakness or fatigue.  HEENT:  - Eyes: No visual changes.  - Ears: No auditory changes. No pain.  - Nose: No sneezing, congestion, runny nose. - Throat: No sore throat. No changes in swallowing. SKIN: No rash or itching.  CARDIOVASCULAR: No chest pain, chest pressure or chest discomfort. No palpitations or edema.  RESPIRATORY: No shortness of breath, cough or sputum.  GASTROINTESTINAL: No anorexia, nausea, vomiting. No changes in bowel habits. No abdominal pain or blood.  GENITOURINARY: No dysuria. No frequency. No discharge. *** NEUROLOGICAL: No headache, dizziness, syncope, paralysis, ataxia, numbness or tingling in the extremities. No memory changes. No change in bowel or bladder control.  MUSCULOSKELETAL: No joint pain. No muscle pain. HEMATOLOGIC: No anemia, bleeding or bruising.  LYMPHATICS: No enlarged lymph nodes.  PSYCHIATRIC: No change in mood. No change in sleep pattern.  ENDOCRINOLOGIC: No reports of sweating, cold or heat intolerance. No polyuria or polydipsia.     Objective  BP 108/70 mmHg  Pulse 67  Temp(Src) 97.5 F (36.4 C) (Oral)  Resp 14  Wt 191 lb (86.637 kg)  SpO2 98% Body mass index is 32.77 kg/(m^2).  Physical Exam  Constitutional: Patient appears well-developed and well-nourished. In no distress.  HEENT:  - Head: Normocephalic and atraumatic.  - Ears:  Bilateral TMs gray, no erythema or effusion - Nose: Nasal mucosa moist - Mouth/Throat: Oropharynx is clear and moist. No tonsillar hypertrophy or erythema. No post nasal drainage.  - Eyes: Conjunctivae clear, EOM movements normal. PERRLA. No scleral icterus.  Neck: Normal range of motion. Neck supple. No JVD present. No thyromegaly present.  Cardiovascular: Normal rate, regular rhythm and normal heart sounds.  No murmur heard.  Pulmonary/Chest: Effort normal and breath sounds normal. No respiratory distress. Musculoskeletal: Normal range of motion bilateral UE and LE, no joint effusions. Peripheral vascular: Bilateral LE no edema. Neurological: CN II-XII grossly intact with no focal deficits. Alert and oriented to person, place, and time. Coordination, balance, strength, speech and gait are normal.  Skin: Skin is warm and  dry. No rash noted. No erythema.  Psychiatric: Patient has a normal mood and affect. Behavior is normal in office today. Judgment and thought content normal in office today.   Assessment & Plan

## 2014-10-24 NOTE — Telephone Encounter (Signed)
Refill request was sent to Dr. Bobetta Lime for approval and submission.  Oxybutynin Chloride ER (10MG  Tablet ER 24HR, 1 tab Oral at bedtime) Active. Recorded 08/19/2013 02:17 PM by Houston Siren, Office Visit.

## 2014-10-24 NOTE — Patient Instructions (Signed)
Bone Densitometry Bone densitometry is a special X-ray that measures your bone density and can be used to help predict your risk of bone fractures. This test is used to determine bone mineral content and density to diagnose osteoporosis. Osteoporosis is the loss of bone that may cause the bone to become weak. Osteoporosis commonly occurs in women entering menopause. However, it may be found in men and in people with other diseases. PREPARATION FOR TEST No preparation necessary. WHO SHOULD BE TESTED?  All women older than 65.  Postmenopausal women (50 to 65) with risk factors for osteoporosis.  People with a previous fracture caused by normal activities.  People with a small body frame (less than 127 poundsor a body mass index [BMI] of less than 21).  People who have a parent with a hip fracture or history of osteoporosis.  People who smoke.  People who have rheumatoid arthritis.  Anyone who engages in excessive alcohol use (more than 3 drinks most days).  Women who experience early menopause. WHEN SHOULD YOU BE RETESTED? Current guidelines suggest that you should wait at least 2 years before doing a bone density test again if your first test was normal.Recent studies indicated that women with normal bone density may be able to wait a few years before needing to repeat a bone density test. You should discuss this with your caregiver.  NORMAL FINDINGS   Normal: less than standard deviation below normal (greater than -1).  Osteopenia: 1 to 2.5 standard deviations below normal (-1 to -2.5).  Osteoporosis: greater than 2.5 standard deviations below normal (less than -2.5). Test results are reported as a "T score" and a "Z score."The T score is a number that compares your bone density with the bone density of healthy, young women.The Z score is a number that compares your bone density with the scores of women who are the same age, gender, and race.  Ranges for normal findings may vary  among different laboratories and hospitals. You should always check with your doctor after having lab work or other tests done to discuss the meaning of your test results and whether your values are considered within normal limits. MEANING OF TEST  Your caregiver will go over the test results with you and discuss the importance and meaning of your results, as well as treatment options and the need for additional tests if necessary. OBTAINING THE TEST RESULTS It is your responsibility to obtain your test results. Ask the lab or department performing the test when and how you will get your results. Document Released: 02/16/2004 Document Revised: 04/18/2011 Document Reviewed: 03/10/2010 [employer] Patient Information 2015 [employer]. This information is not intended to replace advice given to you by your health care provider. Make sure you discuss any questions you have with your health care provider.  

## 2014-10-29 ENCOUNTER — Ambulatory Visit: Payer: Medicare Other | Admitting: Occupational Therapy

## 2014-10-31 ENCOUNTER — Encounter: Payer: Self-pay | Admitting: Occupational Therapy

## 2014-10-31 ENCOUNTER — Ambulatory Visit: Payer: Medicare Other | Admitting: Occupational Therapy

## 2014-10-31 DIAGNOSIS — I890 Lymphedema, not elsewhere classified: Secondary | ICD-10-CM | POA: Diagnosis not present

## 2014-10-31 DIAGNOSIS — M6281 Muscle weakness (generalized): Secondary | ICD-10-CM | POA: Diagnosis not present

## 2014-10-31 DIAGNOSIS — R460 Very low level of personal hygiene: Secondary | ICD-10-CM | POA: Diagnosis not present

## 2014-10-31 DIAGNOSIS — Z741 Need for assistance with personal care: Secondary | ICD-10-CM

## 2014-10-31 NOTE — Therapy (Signed)
[place] MAIN [facility] SERVICES [address] [place], [place], [address] Phone: [phone]   Fax:  [phone]  Occupational Therapy Treatment  Patient Details  Name: Rebecca [name] MRN: [id number] Date of Birth: [birth year] Referring Provider:  Bobetta Lime, MD  Encounter Date: 10/31/2014      OT End of Session - 10/31/14 1556    Visit Number 11   Number of Visits 36   Date for OT Re-Evaluation 01/21/15   Authorization Type Medicare G code visit 11   Authorization Time Period 20   OT Start Time 0935   OT Stop Time 1030   OT Time Calculation (min) 55 min   Activity Tolerance Patient tolerated treatment well;No increased pain   Behavior During Therapy [facility] for tasks assessed/performed      Past Medical History  Diagnosis Date  . Herpes zoster   . Multiple sclerosis 2001    Walks from room to room @ home; but Wheelchair when going out.  . Closed head injury with brief loss of consciousness   . Seizures     Takes Keppra  . IBS (irritable bowel syndrome)     no longer  . Bacterial endocarditis     History of .  Marland Kitchen Aortic valve disease     Mild AS / AI (cannot exclude Bicuspid AoV)  . Syncope and collapse   . Bilateral lower extremity edema     Noncardiac.  Chronic. LE Venous dopplers - negative for DVT.; Echocardiogram January 2016: Normal EF with normal wall motion and valve function. Only grade 1 diastolic dysfunction. EF 60-65%. Mild MR  . Neuromuscular disorder     MS  . Heart murmur   . Lymphedema     has legs wrapped at [facility]  . Cervical stenosis of spine   . Cancer 12-31-13    Right breast, 12:00, 1.5 cm, T1c,N0 invasive mammary carcinoma, triple negative.  . Breast cancer   . MS (multiple sclerosis)   . Irritable bowel     Past Surgical History  Procedure Laterality Date  . Cholecystectomy    . Ankle surgery      Left  . Port-a-cath removal      right  . Anterior cervical decomp/discectomy fusion  11/17/2011     Procedure: ANTERIOR CERVICAL DECOMPRESSION/DISCECTOMY FUSION 2 LEVELS;  Surgeon: Erline Levine, MD;  Location: [place] NEURO ORS;  Service: Neurosurgery;  Laterality: N/A;  Cervical Five-Six Six-Seven Anterior cervical decompression/diskectomy/fusion  . Transthoracic echocardiogram  03/2013; 02/2014    a) Normal LV size and function with EF 60-65%.; Cannot exclude bicuspid aortic valve with mild AS and mild AI.; b) Normal EF with normal wall motion and valve function x Mild MR. G2 DD. EF 60-65%. Tricuspid AoV  . Lower extremity venous dopplers  Feb 27, 2013    No LE DVT  . Port a cath insertion Right 01/19/2010  . Colonoscopy  2014  . Upper gi endoscopy  2014  . Port-a-cath removal Right 09/03/2013    Procedure: REMOVAL PORT-A-CATH;  Surgeon: Conrad Brookridge, MD;  Location: [place];  Service: Vascular;  Laterality: Right;  . Breast biopsy Right 12-31-13    invasive mammary  . Breast surgery Right 02/03/2014    Right simple mastectomy with sentinel node biopsy.  . Mastectomy    . Ankle surgery      There were no vitals filed for this visit.  Visit Diagnosis:  Lymphedema  Muscle weakness (generalized)  Self-care deficit for bathing and  hygiene  Self-care deficit for dressing and grooming      Subjective Assessment - 10/31/14 1553    Subjective  Patient reports she had to cancel on Wednesday due to the rain and not being able to get out of the house.  She reports they did not rewrap her legs at all over the last week, kept the compression wraps the same from last treatment session last week.     Patient is accompained by: Family member   Limitations rents a historical home and has difficulty with access to get to appts, husband has multiple health issues.     Patient Stated Goals Patient wants to get her legs back down to a smaller size, be able to transfer and walk better as well as be able to help more with her self care tasks, especially toileting.   Currently in Pain? No/denies   Pain Score  0-No pain             LYMPHEDEMA/ONCOLOGY QUESTIONNAIRE - 10/31/14 1001    Right Lower Extremity Lymphedema   At Midpatella/Popliteal Crease 46.6 cm   30 cm Proximal to Floor at Lateral Plantar Foot 51.5 cm   20 cm Proximal to Floor at Lateral Plantar Foot 46._0 cm Proximal to Floor at Lateral Malleoli 25.4 cm   5 cm Proximal to 1st MTP Joint 22.4 cm   Across MTP Joint 25.2 cm   Around Proximal Great Toe 10.3 cm   Other 31.7   Left Lower Extremity Lymphedema   At Midpatella/Popliteal Crease 45.2 cm   30 cm Proximal to Floor at Lateral Plantar Foot 44.7 cm   20 cm Proximal to Floor at Lateral Plantar Foot 33 cm   10 cm Proximal to Floor at Lateral Malleoli 24.6 cm   5 cm Proximal to 1st MTP Joint 23.1 cm   Across MTP Joint 25.6 cm   Around Proximal Great Toe 10 cm                 OT Treatments/Exercises (OP) - 10/31/14 1558    Manual Therapy   Manual therapy comments Patient arrived with bandages intact on bilateral lower extremities from foot to knee. Therapist removed bandages and performed skin inspection. Patient did not have any open areas noted on her bilateral lower extremities, however, she did have some redness and irritation on the left anterior portion of the foot/ankle between the malleoli. Circumferential measurements taken and recorded on flowsheet. Patient seen for Skin care of bilateral lower extremities followed with application of Eucerin lotion from foot to knee bilaterally. Stockinette applied from bilateral foot to knee.  Grey foam added to left anterior portion of the foot/ankle to decrease pressure.  Rosidal foam from foot to mid calf and then another  roll to knee bilaterally. Short stretch bandages applied as follows: one 8 cm bandage applied to foot and up to calf. One 10 cm bandage applied from foot to mid leg. One 12 cm bandage from mid leg to knee. All bandages applied with 50% stretch in a circular wrap pattern. Topper added to keep tape  and bandages in place.  All new supplies used this date with bandages and padding.                OT Education - 10/31/14 1555    Education provided Yes   Education Details use of grey foam for area of pressure, compression wrapping and schedule.   Methods Explanation;Demonstration;Verbal cues   Comprehension Verbal  cues required;Returned demonstration;Verbalized understanding             OT Long Term Goals - 10/22/14 1618    OT LONG TERM GOAL #1   Title . Patient to complete toileting with minimal assistance of one person in 12 weeks   Baseline min to mod assist, level flucuates daily   Time 12   Period Weeks   Status On-going   OT LONG TERM GOAL #2   Title Patient to complete bed to wheelchair transfer with minimal assist in 12 weeks.   Baseline mod to max assist, level flucuates   Time 12   Period Weeks   Status On-going   OT LONG TERM GOAL #3   Title Optimal edema control will be achieved, patient and caregiver to be independent in wearing of appropriate compression garments in 12 weeks.    Baseline increased edema and requiring compression bandaging to decongest legs.   Time 12   Period Weeks   Status On-going   OT LONG TERM GOAL #4   Title Husband to be independent with donning and doffing of compression daytime and night time garments in 12 weeks   Baseline unable    Time 12   Period Weeks   Status On-going   OT LONG TERM GOAL #5   Title Optimal edema control will be achieved and patient will be measured for and appropriate compression garment in 12 weeks   Baseline not yet decongested to be measured for custom garments   Time 12   Period Weeks   Status On-going   OT LONG TERM GOAL #6   Title Patient and family to be independent in skincare, HEP and self bandaging in 12 weeks   Baseline minimal cues and assist, familiar with compression bandaging from the past.   Time 12   Period Weeks   Status Partially Met   OT LONG TERM GOAL #7   Title  Patient  will show a decrease in circumference in affected LE at toes 2cm , foot 3 cm ; ankle and calf 5cm, thighs 5 cm to fit in appropriate compression garments and in shoe in 12 weeks   Baseline see flowsheet for circumferential measurements   Time 12   Period Weeks   Status On-going               Plan - 10/31/14 1557    Clinical Impression Statement Patient did not have any open areas noted on her bilateral lower extremities, however, she did have some redness and irritation on the left anterior portion of the foot/ankle between the malleoli. This may have been due to them not rewrapping compression bandages at home as directed.  Discussed the importance of wrapping between sessions and to perform skin checks and skin care.  She verbalizes understanding and reports she felt the area was sore but did not ask her husband to take bandages off to check.  Grey foam added to this area to decrease and distribute pressure of bandages. Will continue to monitor, circumferential measurements up on right calf.  Patient needs to be rewrapped every other day.     Pt will benefit from skilled therapeutic intervention in order to improve on the following deficits (Retired) Difficulty walking;Impaired flexibility;Increased edema;Decreased skin integrity;Decreased strength;Decreased mobility;Decreased balance;Decreased activity tolerance   Rehab Potential Good   OT Frequency 3x / week   OT Duration 12 weeks   OT Treatment/Interventions Self-care/ADL training;Therapeutic exercise;Functional Mobility Training;Patient/family education;Manual Therapy;Manual lymph drainage;DME and/or AE instruction;Compression bandaging  OT Home Exercise Plan Pt has exercises for lower extremity to be performed at home in compression.   Consulted and Agree with Plan of Care Patient;Family member/caregiver   Family Member Consulted spouse- discussed and agreed on POC        Problem List Patient Active Problem List   Diagnosis  Date Noted  . Heart murmur 10/24/2014  . 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 Lymphedema of both lower extremities 10/24/2014  . History of right mastectomy 10/24/2014  . Need for immunization against influenza 10/24/2014  . Medicare annual wellness visit, subsequent 10/24/2014  . MS (multiple sclerosis) 09/16/2014  . Hematoma complicating a procedure 03/06/2014  . Breast cancer 01/10/2014  . SOB (shortness of breath) 03/01/2013  . Bilateral lower extremity edema   . 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 Herpes zoster 06/08/2011  . Multiple sclerosis    Amy Oneita Jolly, OTR/L, CLT Lovett,Amy 10/31/2014, 4:07 PM  [place] MAIN [facility] SERVICES [address] [place], [place], [address] Phone: [phone]   Fax:  [phone]

## 2014-11-05 ENCOUNTER — Ambulatory Visit: Payer: Medicare Other | Admitting: Occupational Therapy

## 2014-11-05 ENCOUNTER — Encounter: Payer: Self-pay | Admitting: Occupational Therapy

## 2014-11-05 DIAGNOSIS — M6281 Muscle weakness (generalized): Secondary | ICD-10-CM

## 2014-11-05 DIAGNOSIS — Z741 Need for assistance with personal care: Secondary | ICD-10-CM

## 2014-11-05 DIAGNOSIS — I890 Lymphedema, not elsewhere classified: Secondary | ICD-10-CM

## 2014-11-05 DIAGNOSIS — R460 Very low level of personal hygiene: Secondary | ICD-10-CM | POA: Diagnosis not present

## 2014-11-06 NOTE — Therapy (Signed)
[place] MAIN [facility] SERVICES [address] [place], [place], [address] Phone: [phone]   Fax:  [phone]  Occupational Therapy Treatment  Patient Details  Name: Rebecca [name] MRN: [id number] Date of Birth: 1961-01-09 Referring Provider:  Bobetta Lime, MD  Encounter Date: 11/05/2014      OT End of Session - 11/06/14 1629    Visit Number 12   Number of Visits 36   Date for OT Re-Evaluation 01/21/15   Authorization Type Medicare G code visit 12   Authorization Time Period 20   OT Start Time 0830   OT Stop Time 0915   OT Time Calculation (min) 45 min   Activity Tolerance Patient tolerated treatment well;No increased pain   Behavior During Therapy [facility] for tasks assessed/performed      Past Medical History  Diagnosis Date  . Herpes zoster   . Multiple sclerosis 2001    Walks from room to room @ home; but Wheelchair when going out.  . Closed head injury with brief loss of consciousness   . Seizures     Takes Keppra  . IBS (irritable bowel syndrome)     no longer  . Bacterial endocarditis     History of .  Marland Kitchen Aortic valve disease     Mild AS / AI (cannot exclude Bicuspid AoV)  . Syncope and collapse   . Bilateral lower extremity edema     Noncardiac.  Chronic. LE Venous dopplers - negative for DVT.; Echocardiogram January 2016: Normal EF with normal wall motion and valve function. Only grade 1 diastolic dysfunction. EF 60-65%. Mild MR  . Neuromuscular disorder     MS  . Heart murmur   . Lymphedema     has legs wrapped at [facility]  . Cervical stenosis of spine   . Cancer 12-31-13    Right breast, 12:00, 1.5 cm, T1c,N0 invasive mammary carcinoma, triple negative.  . Breast cancer   . MS (multiple sclerosis)   . Irritable bowel     Past Surgical History  Procedure Laterality Date  . Cholecystectomy    . Ankle surgery      Left  . Port-a-cath removal      right  . Anterior cervical decomp/discectomy fusion  11/17/2011    Procedure: ANTERIOR CERVICAL DECOMPRESSION/DISCECTOMY FUSION 2 LEVELS;  Surgeon: Erline Levine, MD;  Location: [place] NEURO ORS;  Service: Neurosurgery;  Laterality: N/A;  Cervical Five-Six Six-Seven Anterior cervical decompression/diskectomy/fusion  . Transthoracic echocardiogram  03/2013; 02/2014    a) Normal LV size and function with EF 60-65%.; Cannot exclude bicuspid aortic valve with mild AS and mild AI.; b) Normal EF with normal wall motion and valve function x Mild MR. G2 DD. EF 60-65%. Tricuspid AoV  . Lower extremity venous dopplers  Feb 27, 2013    No LE DVT  . Port a cath insertion Right 01/19/2010  . Colonoscopy  2014  . Upper gi endoscopy  2014  . Port-a-cath removal Right 09/03/2013    Procedure: REMOVAL PORT-A-CATH;  Surgeon: Conrad [place], MD;  Location: [place];  Service: Vascular;  Laterality: Right;  . Breast biopsy Right 12-31-13    invasive mammary  . Breast surgery Right 02/03/2014    Right simple mastectomy with sentinel node biopsy.  . Mastectomy    . Ankle surgery      There were no vitals filed for this visit.  Visit Diagnosis:  Lymphedema  Muscle weakness (generalized)  Self-care deficit for bathing and hygiene  Self-care deficit for dressing and grooming      Subjective Assessment - 11/05/14 1627    Subjective  Patient reports she has been doing well, no pain this date and ankle feels better with grey foam placed last session.   Patient is accompained by: Family member   Limitations rents a historical home and has difficulty with access to get to appts, husband has multiple health issues.     Patient Stated Goals Patient wants to get her legs back down to a smaller size, be able to transfer and walk better as well as be able to help more with her self care tasks, especially toileting.   Currently in Pain? No/denies   Pain Score 0-No pain             LYMPHEDEMA/ONCOLOGY QUESTIONNAIRE - 11/05/14 0853    Right Lower Extremity Lymphedema   At  Midpatella/Popliteal Crease 48.5 cm   30 cm Proximal to Floor at Lateral Plantar Foot 49.6 cm   20 cm Proximal to Floor at Lateral Plantar Foot 40.$RemoveBeforeD'2 1   10 'btiGfyggVNpoCl$ cm Proximal to Floor at Lateral Malleoli 24.7 cm   5 cm Proximal to 1st MTP Joint 22.2 cm   Across MTP Joint 24.8 cm   Around Proximal Great Toe 10.3 cm   Other 31.7   Left Lower Extremity Lymphedema   30 cm Proximal to Floor at Lateral Plantar Foot 42.8 cm   20 cm Proximal to Floor at Lateral Plantar Foot 32.8 cm   10 cm Proximal to Floor at Lateral Malleoli 25.3 cm   5 cm Proximal to 1st MTP Joint 23.5 cm   Across MTP Joint 25.5 cm   Around Proximal Great Toe 10 cm   Other 31.8                 OT Treatments/Exercises (OP) - 11/05/14 1632    Manual Therapy   Manual therapy comments Patient arrived with bandages intact on bilateral lower extremities from foot to knee. Therapist removed bandages and performed skin inspection. Patient did not have any open areas noted on her bilateral lower extremities, however, she did have some redness and irritation on the left anterior portion of the foot/ankle between the malleoli. Circumferential measurements taken and recorded on flowsheet. Patient seen for Skin care of bilateral lower extremities followed with application of Eucerin lotion from foot to knee bilaterally. Stockinette applied from bilateral foot to knee.  Grey foam added to left anterior portion of the foot/ankle to decrease pressure.  Rosidal foam from foot to mid calf and then another  roll to knee bilaterally. Short stretch bandages applied as follows: one 8 cm bandage applied to foot and up to calf. One 10 cm bandage applied from foot to mid leg. One 12 cm bandage from mid leg to knee. All bandages applied with 50% stretch in a circular wrap pattern. Topper added to keep tape and bandages in place.  All new supplies used this date with bandages and padding.                OT Education - 11/05/14 1628    Education  provided Yes   Education Details continued use of grey foam for ankle, compression wrapping, skin care.   Person(s) Educated Spouse;Patient   Methods Explanation;Demonstration;Verbal cues   Comprehension Verbal cues required;Returned demonstration;Verbalized understanding             OT Long Term Goals - 10/22/14 1618    OT LONG TERM GOAL #1  Title . Patient to complete toileting with minimal assistance of one person in 12 weeks   Baseline min to mod assist, level flucuates daily   Time 12   Period Weeks   Status On-going   OT LONG TERM GOAL #2   Title Patient to complete bed to wheelchair transfer with minimal assist in 12 weeks.   Baseline mod to max assist, level flucuates   Time 12   Period Weeks   Status On-going   OT LONG TERM GOAL #3   Title Optimal edema control will be achieved, patient and caregiver to be independent in wearing of appropriate compression garments in 12 weeks.    Baseline increased edema and requiring compression bandaging to decongest legs.   Time 12   Period Weeks   Status On-going   OT LONG TERM GOAL #4   Title Husband to be independent with donning and doffing of compression daytime and night time garments in 12 weeks   Baseline unable    Time 12   Period Weeks   Status On-going   OT LONG TERM GOAL #5   Title Optimal edema control will be achieved and patient will be measured for and appropriate compression garment in 12 weeks   Baseline not yet decongested to be measured for custom garments   Time 12   Period Weeks   Status On-going   OT LONG TERM GOAL #6   Title Patient and family to be independent in skincare, HEP and self bandaging in 12 weeks   Baseline minimal cues and assist, familiar with compression bandaging from the past.   Time 12   Period Weeks   Status Partially Met   OT LONG TERM GOAL #7   Title  Patient will show a decrease in circumference in affected LE at toes 2cm , foot 3 cm ; ankle and calf 5cm, thighs 5 cm to fit  in appropriate compression garments and in shoe in 12 weeks   Baseline see flowsheet for circumferential measurements   Time 12   Period Weeks   Status On-going               Plan - 11/05/14 1631    Clinical Impression Statement Use of grey foam at the ankle anteriorly between [facility] helped with decrease in pressure of this area, decreased redness noted this date and patient reporting increased comfort.  Patient continues to progress with decongestion of lower legs but given her limited mobility, she has not been able to tolerate wrapping higher than the knees.  Will need to discuss again with patient to see what her goal is for the thigh areas and if she wants to try to proceed with treatment to this area.  If not, will need to look at getting compression garments for below the knee only.     Pt will benefit from skilled therapeutic intervention in order to improve on the following deficits (Retired) Difficulty walking;Impaired flexibility;Increased edema;Decreased skin integrity;Decreased strength;Decreased mobility;Decreased balance;Decreased activity tolerance   Rehab Potential Good   OT Frequency 3x / week   OT Duration 12 weeks   OT Treatment/Interventions Self-care/ADL training;Therapeutic exercise;Functional Mobility Training;Patient/family education;Manual Therapy;Manual lymph drainage;DME and/or AE instruction;Compression bandaging   OT Home Exercise Plan Pt has exercises for lower extremity to be performed at home in compression.   Consulted and Agree with Plan of Care Patient;Family member/caregiver   Family Member Consulted spouse- discussed and agreed on POC        Problem List Patient Active  Problem List   Diagnosis Date Noted  . Heart murmur 10/24/2014  . 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 Lymphedema of both lower extremities 10/24/2014  . History of right mastectomy 10/24/2014  . Need for immunization against influenza 10/24/2014  . Medicare annual wellness visit,  subsequent 10/24/2014  . MS (multiple sclerosis) 09/16/2014  . Hematoma complicating a procedure 03/06/2014  . Breast cancer 01/10/2014  . SOB (shortness of breath) 03/01/2013  . Bilateral lower extremity edema   . 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 Herpes zoster 06/08/2011  . Multiple sclerosis    Amy Oneita Jolly, OTR/L, CLT Lovett,Amy 11/06/2014, 4:36 PM  [place] MAIN [facility] SERVICES [address] [place], [place], [address] Phone: [phone]   Fax:  [phone]

## 2014-11-07 ENCOUNTER — Ambulatory Visit: Payer: Medicare Other | Admitting: Occupational Therapy

## 2014-11-10 ENCOUNTER — Ambulatory Visit: Payer: Medicare Other | Admitting: Occupational Therapy

## 2014-11-13 ENCOUNTER — Telehealth: Payer: Self-pay | Admitting: *Deleted

## 2014-11-13 MED ORDER — FENTANYL 25 MCG/HR TD PT72: 25.0000 ug | MEDICATED_PATCH | TRANSDERMAL | Status: DC

## 2014-11-13 NOTE — Telephone Encounter (Signed)
Informed that prescription is ready to pick up  

## 2014-11-14 ENCOUNTER — Ambulatory Visit: Payer: Medicare Other | Admitting: Occupational Therapy

## 2014-11-19 ENCOUNTER — Encounter: Payer: Self-pay | Admitting: Occupational Therapy

## 2014-11-19 ENCOUNTER — Ambulatory Visit: Payer: Medicare Other | Attending: Family Medicine | Admitting: Occupational Therapy

## 2014-11-19 DIAGNOSIS — M6281 Muscle weakness (generalized): Secondary | ICD-10-CM | POA: Diagnosis not present

## 2014-11-19 DIAGNOSIS — I890 Lymphedema, not elsewhere classified: Secondary | ICD-10-CM | POA: Diagnosis not present

## 2014-11-19 DIAGNOSIS — Z741 Need for assistance with personal care: Secondary | ICD-10-CM

## 2014-11-19 DIAGNOSIS — R460 Very low level of personal hygiene: Secondary | ICD-10-CM | POA: Diagnosis not present

## 2014-11-20 NOTE — Therapy (Signed)
[place] MAIN [facility] SERVICES [address] [place], [place], [address] Phone: [phone]   Fax:  [phone]  Occupational Therapy Treatment  Patient Details  Name: Rebecca [name] MRN: [id number] Date of Birth: 29-May-1960 Referring Provider:  Bobetta Lime, MD  Encounter Date: 11/19/2014      OT End of Session - 11/19/14 1646    Visit Number 13   Number of Visits 36   Date for OT Re-Evaluation 01/21/15   Authorization Type Medicare G code visit 13   Authorization Time Period 20   OT Start Time 1100   OT Stop Time 1201   OT Time Calculation (min) 61 min   Activity Tolerance Patient tolerated treatment well;No increased pain   Behavior During Therapy [facility] for tasks assessed/performed      Past Medical History  Diagnosis Date  . Herpes zoster   . Multiple sclerosis ([place]) 2001    Walks from room to room @ home; but Wheelchair when going out.  . Closed head injury with brief loss of consciousness ([place])   . Seizures ([place])     Takes Keppra  . IBS (irritable bowel syndrome)     no longer  . Bacterial endocarditis     History of .  Marland Kitchen Aortic valve disease     Mild AS / AI (cannot exclude Bicuspid AoV)  . Syncope and collapse   . Bilateral lower extremity edema     Noncardiac.  Chronic. LE Venous dopplers - negative for DVT.; Echocardiogram January 2016: Normal EF with normal wall motion and valve function. Only grade 1 diastolic dysfunction. EF 60-65%. Mild MR  . Neuromuscular disorder ([place])     MS  . Heart murmur   . Lymphedema     has legs wrapped at [facility]  . Cervical stenosis of spine   . Cancer [facility]) 12-31-13    Right breast, 12:00, 1.5 cm, T1c,N0 invasive mammary carcinoma, triple negative.  . Breast cancer ([place])   . MS (multiple sclerosis) ([place])   . Irritable bowel     Past Surgical History  Procedure Laterality Date  . Cholecystectomy    . Ankle surgery      Left  . Port-a-cath removal      right  . Anterior  cervical decomp/discectomy fusion  11/17/2011    Procedure: ANTERIOR CERVICAL DECOMPRESSION/DISCECTOMY FUSION 2 LEVELS;  Surgeon: Erline Levine, MD;  Location: [place] NEURO ORS;  Service: Neurosurgery;  Laterality: N/A;  Cervical Five-Six Six-Seven Anterior cervical decompression/diskectomy/fusion  . Transthoracic echocardiogram  03/2013; 02/2014    a) Normal LV size and function with EF 60-65%.; Cannot exclude bicuspid aortic valve with mild AS and mild AI.; b) Normal EF with normal wall motion and valve function x Mild MR. G2 DD. EF 60-65%. Tricuspid AoV  . Lower extremity venous dopplers  Feb 27, 2013    No LE DVT  . Port a cath insertion Right 01/19/2010  . Colonoscopy  2014  . Upper gi endoscopy  2014  . Port-a-cath removal Right 09/03/2013    Procedure: REMOVAL PORT-A-CATH;  Surgeon: Conrad Schurz, MD;  Location: [place];  Service: Vascular;  Laterality: Right;  . Breast biopsy Right 12-31-13    invasive mammary  . Breast surgery Right 02/03/2014    Right simple mastectomy with sentinel node biopsy.  . Mastectomy    . Ankle surgery      There were no vitals filed for this visit.  Visit Diagnosis:  Lymphedema  Muscle weakness (  generalized)  Self-care deficit for bathing and hygiene  Self-care deficit for dressing and grooming      Subjective Assessment - 11/19/14 1643    Subjective  Patient complains of increased edema in her knees and thighs, discussed the option of bandaging in this area or trying compression shorts or capris.   Patient is accompained by: Family member   Patient Stated Goals Patient wants to get her legs back down to a smaller size, be able to transfer and walk better as well as be able to help more with her self care tasks, especially toileting.   Currently in Pain? No/denies   Pain Score 0-No pain             LYMPHEDEMA/ONCOLOGY QUESTIONNAIRE - 11/19/14 1135    Right Lower Extremity Lymphedema   At Midpatella/Popliteal Crease 44.1 cm   30 cm Proximal to  Floor at Lateral Plantar Foot 47.6 cm   20 cm Proximal to Floor at Lateral Plantar Foot 37 1   10 cm Proximal to Floor at Lateral Malleoli 24.8 cm   5 cm Proximal to 1st MTP Joint 22.7 cm   Across MTP Joint 25.2 cm   Around Proximal Great Toe 10.1 cm   Other 32.2   Left Lower Extremity Lymphedema   At Midpatella/Popliteal Crease 42.8 cm   30 cm Proximal to Floor at Lateral Plantar Foot 42.5 cm   20 cm Proximal to Floor at Lateral Plantar Foot 32.5 cm   10 cm Proximal to Floor at Lateral Malleoli 24.6 cm   5 cm Proximal to 1st MTP Joint 23.6 cm   Across MTP Joint 25.3 cm   Around Proximal Great Toe 10.6 cm   Other 31.2                 OT Treatments/Exercises (OP) - 11/19/14 1644    Manual Therapy   Manual therapy comments Patient arrived with bandages intact on bilateral lower extremities from foot to knee. Therapist removed bandages and performed skin inspection.  Circumferential measurements taken and recorded on flowsheet. Patient seen for Skin care of bilateral lower extremities followed with application of Eucerin lotion from foot to knee bilaterally. Stockinette applied from bilateral foot to knee.  Grey foam added to left anterior portion of the foot/ankle to decrease pressure.  Rosidal foam from foot to mid calf and then another  roll to knee bilaterally. Short stretch bandages applied as follows: one 8 cm bandage applied to foot and up to calf. One 10 cm bandage applied from foot to mid leg. One 12 cm bandage from mid leg to knee. All bandages applied with 50% stretch in a circular wrap pattern. Topper added to keep tape and bandages in place.  All new supplies used this date with bandages and padding.                OT Education - 11/19/14 1645    Education provided Yes   Education Details grey foam, compression wrapping, HEP, potential need for PT for functional transfers and mobility   Person(s) Educated Patient;Spouse   Methods Explanation;Demonstration    Comprehension Returned demonstration;Verbalized understanding             OT Long Term Goals - 10/22/14 1618    OT LONG TERM GOAL #1   Title . Patient to complete toileting with minimal assistance of one person in 12 weeks   Baseline min to mod assist, level flucuates daily   Time 12   Period Weeks  Status On-going   OT LONG TERM GOAL #2   Title Patient to complete bed to wheelchair transfer with minimal assist in 12 weeks.   Baseline mod to max assist, level flucuates   Time 12   Period Weeks   Status On-going   OT LONG TERM GOAL #3   Title Optimal edema control will be achieved, patient and caregiver to be independent in wearing of appropriate compression garments in 12 weeks.    Baseline increased edema and requiring compression bandaging to decongest legs.   Time 12   Period Weeks   Status On-going   OT LONG TERM GOAL #4   Title Husband to be independent with donning and doffing of compression daytime and night time garments in 12 weeks   Baseline unable    Time 12   Period Weeks   Status On-going   OT LONG TERM GOAL #5   Title Optimal edema control will be achieved and patient will be measured for and appropriate compression garment in 12 weeks   Baseline not yet decongested to be measured for custom garments   Time 12   Period Weeks   Status On-going   OT LONG TERM GOAL #6   Title Patient and family to be independent in skincare, HEP and self bandaging in 12 weeks   Baseline minimal cues and assist, familiar with compression bandaging from the past.   Time 12   Period Weeks   Status Partially Met   OT LONG TERM GOAL #7   Title  Patient will show a decrease in circumference in affected LE at toes 2cm , foot 3 cm ; ankle and calf 5cm, thighs 5 cm to fit in appropriate compression garments and in shoe in 12 weeks   Baseline see flowsheet for circumferential measurements   Time 12   Period Weeks   Status On-going               Plan - 11/19/14 1646     Clinical Impression Statement Patient reporting some discomfort in the thighs at times and knees which is likely due to edema in this area.  Discussed the possibility of compression wrapping in this area however patient is unsure, she feels this hinders her ability to complete toileting.  Discussed options of compression shorts or capris to see if she would like to try before considering custom garments.  She may have difficulty with pulling them up and down. Continuing to see reductions in circumferential measurements and will continue until optimal decongestion is acheieved and get her fitted with custom compression.    Pt will benefit from skilled therapeutic intervention in order to improve on the following deficits (Retired) Difficulty walking;Impaired flexibility;Increased edema;Decreased skin integrity;Decreased strength;Decreased mobility;Decreased balance;Decreased activity tolerance   Rehab Potential Good   OT Frequency 3x / week   OT Duration 12 weeks   OT Treatment/Interventions Self-care/ADL training;Therapeutic exercise;Functional Mobility Training;Patient/family education;Manual Therapy;Manual lymph drainage;DME and/or AE instruction;Compression bandaging   Consulted and Agree with Plan of Care Patient;Family member/caregiver   Family Member Consulted spouse- discussed and agreed on POC        Problem List Patient Active Problem List   Diagnosis Date Noted  . Heart murmur 10/24/2014  . 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 Lymphedema of both lower extremities 10/24/2014  . History of right mastectomy 10/24/2014  . Need for immunization against influenza 10/24/2014  . Medicare annual wellness visit, subsequent 10/24/2014  . MS (multiple sclerosis) ([place]) 09/16/2014  . Hematoma complicating a procedure  03/06/2014  . Breast cancer ([place]) 01/10/2014  . SOB (shortness of breath) 03/01/2013  . Bilateral lower extremity edema   . 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 Herpes  zoster 06/08/2011  . Multiple sclerosis ([place])    Amy Oneita Jolly, OTR/L, CLT  Lovett,Amy 11/20/2014, 4:35 PM  [place] MAIN [facility] SERVICES [address] [place], [place], [address] Phone: [phone]   Fax:  [phone]

## 2014-11-21 ENCOUNTER — Ambulatory Visit: Payer: Medicare Other | Admitting: Occupational Therapy

## 2014-11-26 ENCOUNTER — Inpatient Hospital Stay: Payer: Medicare Other

## 2014-11-26 ENCOUNTER — Inpatient Hospital Stay: Payer: Medicare Other | Attending: Oncology

## 2014-11-26 ENCOUNTER — Encounter: Payer: Self-pay | Admitting: Occupational Therapy

## 2014-11-26 ENCOUNTER — Inpatient Hospital Stay (HOSPITAL_BASED_OUTPATIENT_CLINIC_OR_DEPARTMENT_OTHER): Payer: Medicare Other | Admitting: Oncology

## 2014-11-26 VITALS — BP 110/72 | HR 67 | Temp 97.8°F | Resp 18

## 2014-11-26 DIAGNOSIS — C50911 Malignant neoplasm of unspecified site of right female breast: Secondary | ICD-10-CM | POA: Diagnosis not present

## 2014-11-26 DIAGNOSIS — G629 Polyneuropathy, unspecified: Secondary | ICD-10-CM | POA: Diagnosis not present

## 2014-11-26 DIAGNOSIS — Z171 Estrogen receptor negative status [ER-]: Secondary | ICD-10-CM

## 2014-11-26 DIAGNOSIS — G35 Multiple sclerosis: Secondary | ICD-10-CM | POA: Diagnosis not present

## 2014-11-26 DIAGNOSIS — Z79899 Other long term (current) drug therapy: Secondary | ICD-10-CM

## 2014-11-26 DIAGNOSIS — K589 Irritable bowel syndrome without diarrhea: Secondary | ICD-10-CM | POA: Insufficient documentation

## 2014-11-26 DIAGNOSIS — I890 Lymphedema, not elsewhere classified: Secondary | ICD-10-CM

## 2014-11-26 DIAGNOSIS — C50919 Malignant neoplasm of unspecified site of unspecified female breast: Secondary | ICD-10-CM

## 2014-11-26 DIAGNOSIS — C50912 Malignant neoplasm of unspecified site of left female breast: Secondary | ICD-10-CM

## 2014-11-26 MED ORDER — OXYCODONE HCL 10 MG PO TABS: 10.0000 mg | ORAL_TABLET | Freq: Four times a day (QID) | ORAL | Status: DC | PRN

## 2014-11-26 MED ORDER — SODIUM CHLORIDE 0.9 % IJ SOLN
10.0000 mL | INTRAMUSCULAR | Status: DC | PRN
  Administered 2014-11-26: 10 mL via INTRAVENOUS
  Filled 2014-11-26: qty 10

## 2014-11-26 MED ORDER — HEPARIN SOD (PORK) LOCK FLUSH 100 UNIT/ML IV SOLN
500.0000 [IU] | Freq: Once | INTRAVENOUS | Status: AC
  Administered 2014-11-26: 500 [IU] via INTRAVENOUS

## 2014-11-26 MED ORDER — HEPARIN SOD (PORK) LOCK FLUSH 100 UNIT/ML IV SOLN
INTRAVENOUS | Status: AC
  Filled 2014-11-26: qty 5

## 2014-11-27 ENCOUNTER — Ambulatory Visit: Payer: Medicare Other

## 2014-11-27 ENCOUNTER — Other Ambulatory Visit: Payer: Medicare Other

## 2014-11-27 ENCOUNTER — Ambulatory Visit: Payer: Medicare Other | Admitting: Oncology

## 2014-11-27 LAB — CANCER ANTIGEN 27.29: CA 27.29: 15.5 U/mL (ref 0.0–38.6)

## 2014-11-28 ENCOUNTER — Ambulatory Visit: Payer: Medicare Other | Admitting: Occupational Therapy

## 2014-12-03 ENCOUNTER — Ambulatory Visit: Payer: Medicare Other | Admitting: Occupational Therapy

## 2014-12-03 ENCOUNTER — Other Ambulatory Visit: Payer: Self-pay | Admitting: Diagnostic Neuroimaging

## 2014-12-03 ENCOUNTER — Other Ambulatory Visit: Payer: Self-pay | Admitting: Family Medicine

## 2014-12-03 ENCOUNTER — Encounter: Payer: Self-pay | Admitting: Occupational Therapy

## 2014-12-03 DIAGNOSIS — M6281 Muscle weakness (generalized): Secondary | ICD-10-CM

## 2014-12-03 DIAGNOSIS — I890 Lymphedema, not elsewhere classified: Secondary | ICD-10-CM | POA: Diagnosis not present

## 2014-12-03 DIAGNOSIS — R460 Very low level of personal hygiene: Secondary | ICD-10-CM

## 2014-12-03 DIAGNOSIS — Z741 Need for assistance with personal care: Secondary | ICD-10-CM

## 2014-12-03 NOTE — Therapy (Signed)
[place] MAIN [facility] SERVICES [address] [place], [place], [address] Phone: [phone]   Fax:  [phone]  Occupational Therapy Treatment  Patient Details  Name: Rebecca [name] MRN: [id number] Date of Birth: 08-15-1960 No Data Recorded  Encounter Date: 12/03/2014      OT End of Session - 12/03/14 1618    Visit Number 14   Number of Visits 36   Date for OT Re-Evaluation 01/21/15   Authorization Type Medicare G code visit 14   OT Start Time 1300   OT Stop Time 1975   OT Time Calculation (min) 65 min   Activity Tolerance Patient tolerated treatment well;No increased pain   Behavior During Therapy [facility] for tasks assessed/performed      Past Medical History  Diagnosis Date  . Herpes zoster   . Multiple sclerosis ([place]) 2001    Walks from room to room @ home; but Wheelchair when going out.  . Closed head injury with brief loss of consciousness ([place])   . Seizures ([place])     Takes Keppra  . IBS (irritable bowel syndrome)     no longer  . Bacterial endocarditis     History of .  Marland Kitchen Aortic valve disease     Mild AS / AI (cannot exclude Bicuspid AoV)  . Syncope and collapse   . Bilateral lower extremity edema     Noncardiac.  Chronic. LE Venous dopplers - negative for DVT.; Echocardiogram January 2016: Normal EF with normal wall motion and valve function. Only grade 1 diastolic dysfunction. EF 60-65%. Mild MR  . Neuromuscular disorder ([place])     MS  . Heart murmur   . Lymphedema     [facility]  . Cervical stenosis of spine   . Cancer [facility]) 12-31-13    Right breast, 12:00, 1.5 cm, T1c,N0 invasive mammary carcinoma, triple negative.  . Breast cancer ([place])   . MS (multiple sclerosis) ([place])   . Irritable bowel     Past Surgical History  Procedure Laterality Date  . Cholecystectomy    . Ankle surgery      Left  . Port-a-cath removal      right  . Anterior cervical decomp/discectomy fusion  11/17/2011    Procedure:  ANTERIOR CERVICAL DECOMPRESSION/DISCECTOMY FUSION 2 LEVELS;  Surgeon: Erline Levine, MD;  Location: [place] NEURO ORS;  Service: Neurosurgery;  Laterality: N/A;  Cervical Five-Six Six-Seven Anterior cervical decompression/diskectomy/fusion  . Transthoracic echocardiogram  03/2013; 02/2014    a) Normal LV size and function with EF 60-65%.; Cannot exclude bicuspid aortic valve with mild AS and mild AI.; b) Normal EF with normal wall motion and valve function x Mild MR. G2 DD. EF 60-65%. Tricuspid AoV  . Lower extremity venous dopplers  Feb 27, 2013    No LE DVT  . Port a cath insertion Right 01/19/2010  . Colonoscopy  2014  . Upper gi endoscopy  2014  . Port-a-cath removal Right 09/03/2013    Procedure: REMOVAL PORT-A-CATH;  Surgeon: Conrad Jagual, MD;  Location: [place];  Service: Vascular;  Laterality: Right;  . Breast biopsy Right 12-31-13    invasive mammary  . Breast surgery Right 02/03/2014    Right simple mastectomy with sentinel node biopsy.  . Mastectomy    . Ankle surgery      There were no vitals filed for this visit.  Visit Diagnosis:  Lymphedema  Muscle weakness (generalized)  Self-care deficit for bathing and hygiene  Self-care deficit for dressing and grooming      Subjective Assessment - 12/03/14 1615    Subjective  Patient reports she got a new lift recliner chair, her dad was able to buy it for her.  She loves it and feels she can tell a difference in her legs just by being able to elevate them.  She is now able to get up by herself with the use of the lift and get to the bathroom.  Feels much better about being able to go when she needs rather than relying on her husband each time.    Patient Stated Goals Patient wants to get her legs back down to a smaller size, be able to transfer and walk better as well as be able to help more with her self care tasks, especially toileting.   Currently in Pain? No/denies   Pain Score 0-No pain             LYMPHEDEMA/ONCOLOGY  QUESTIONNAIRE - 12/03/14 1314    Right Lower Extremity Lymphedema   20 cm Proximal to Suprapatella 63 cm   10 cm Proximal to Suprapatella 59 cm   At Midpatella/Popliteal Crease 43.5 cm   30 cm Proximal to Floor at Lateral Plantar Foot 43.2 cm   20 cm Proximal to Floor at Lateral Plantar Foot 33._0 cm Proximal to Floor at Lateral Malleoli 23.9 cm   5 cm Proximal to 1st MTP Joint 22.1 cm   Across MTP Joint 23.2 cm   Around Proximal Great Toe 10 cm   Other 30.8   Left Lower Extremity Lymphedema   20 cm Proximal to Suprapatella 62 cm   10 cm Proximal to Suprapatella 59.5 cm   At Midpatella/Popliteal Crease 42.1 cm   30 cm Proximal to Floor at Lateral Plantar Foot 40 cm   20 cm Proximal to Floor at Lateral Plantar Foot 31.2 cm   10 cm Proximal to Floor at Lateral Malleoli 24.7 cm   5 cm Proximal to 1st MTP Joint 22.3 cm   Across MTP Joint 24 cm   Around Proximal Great Toe 10.4 cm   Other 30.8                 OT Treatments/Exercises (OP) - 12/03/14 1314    Manual Therapy   Manual therapy comments Patient arrived with bandages intact on bilateral lower extremities from foot to knee. Therapist removed bandages and performed skin inspection.  Circumferential measurements taken and recorded on flowsheet. Patient seen for Skin care of bilateral lower extremities followed with application of Eucerin lotion from foot to knee bilaterally. Stockinette applied from bilateral foot to knee.  Grey foam added to left anterior portion of the foot/ankle to decrease pressure.  Rosidal foam from foot to mid calf and then another  roll to knee bilaterally. Short stretch bandages applied as follows: one 8 cm bandage applied to foot and up to calf. One 10 cm bandage applied from foot to mid leg. One 12 cm bandage from mid leg to knee. All bandages applied with 50% stretch in a circular wrap pattern. Topper added to keep tape and bandages in place.  All new supplies used this date with bandages and  padding.                OT Education - 12/03/14 1617    Education provided Yes   Education Details use of grey foam for left foot, HEP, elevation of legs in recliner  Person(s) Educated Patient;Spouse   Methods Explanation;Demonstration;Verbal cues   Comprehension Verbal cues required;Returned demonstration;Verbalized understanding             OT Long Term Goals - 10/22/14 1618    OT LONG TERM GOAL #1   Title . Patient to complete toileting with minimal assistance of one person in 12 weeks   Baseline min to mod assist, level flucuates daily   Time 12   Period Weeks   Status On-going   OT LONG TERM GOAL #2   Title Patient to complete bed to wheelchair transfer with minimal assist in 12 weeks.   Baseline mod to max assist, level flucuates   Time 12   Period Weeks   Status On-going   OT LONG TERM GOAL #3   Title Optimal edema control will be achieved, patient and caregiver to be independent in wearing of appropriate compression garments in 12 weeks.    Baseline increased edema and requiring compression bandaging to decongest legs.   Time 12   Period Weeks   Status On-going   OT LONG TERM GOAL #4   Title Husband to be independent with donning and doffing of compression daytime and night time garments in 12 weeks   Baseline unable    Time 12   Period Weeks   Status On-going   OT LONG TERM GOAL #5   Title Optimal edema control will be achieved and patient will be measured for and appropriate compression garment in 12 weeks   Baseline not yet decongested to be measured for custom garments   Time 12   Period Weeks   Status On-going   OT LONG TERM GOAL #6   Title Patient and family to be independent in skincare, HEP and self bandaging in 12 weeks   Baseline minimal cues and assist, familiar with compression bandaging from the past.   Time 12   Period Weeks   Status Partially Met   OT LONG TERM GOAL #7   Title  Patient will show a decrease in circumference in  affected LE at toes 2cm , foot 3 cm ; ankle and calf 5cm, thighs 5 cm to fit in appropriate compression garments and in shoe in 12 weeks   Baseline see flowsheet for circumferential measurements   Time 12   Period Weeks   Status On-going               Plan - 12/03/14 1618    Clinical Impression Statement Patient was able to get a recliner chair with a lift and has greater independence with being able to use the lift to get up to go to the bathroom.  She has also been able to elevate her feet and legs more and has shown a dramatic decrease in edema since last session, thighs decreased by 10 cm in places.  Will reassess patient  next visit and determine if she is ready for measurements for custom compression garments.  Excellent progress, slight redness on left ankle anteriorly.   Pt will benefit from skilled therapeutic intervention in order to improve on the following deficits (Retired) Difficulty walking;Impaired flexibility;Increased edema;Decreased skin integrity;Decreased strength;Decreased mobility;Decreased balance;Decreased activity tolerance   Rehab Potential Good   OT Frequency 3x / week   OT Duration 12 weeks   OT Treatment/Interventions Self-care/ADL training;Therapeutic exercise;Functional Mobility Training;Patient/family education;Manual Therapy;Manual lymph drainage;DME and/or AE instruction;Compression bandaging   OT Home Exercise Plan Pt has exercises for lower extremity to be performed at home in compression.   Consulted  and Agree with Plan of Care Patient;Family member/caregiver   Family Member Consulted spouse- discussed and agreed on POC        Problem List Patient Active Problem List   Diagnosis Date Noted  . Heart murmur 10/24/2014  . 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 Lymphedema of both lower extremities 10/24/2014  . History of right mastectomy 10/24/2014  . Need for immunization against influenza 10/24/2014  . Medicare annual wellness visit, subsequent  10/24/2014  . MS (multiple sclerosis) ([place]) 09/16/2014  . Hematoma complicating a procedure 03/06/2014  . Breast cancer ([place]) 01/10/2014  . SOB (shortness of breath) 03/01/2013  . Bilateral lower extremity edema   . 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 Herpes zoster 06/08/2011  . Multiple sclerosis ([place])    Tyree Vandruff Oneita Jolly, OTR/L, CLT Trebor Galdamez 12/03/2014, 4:22 PM  [place] MAIN [facility] SERVICES [address] [place], [place], [address] Phone: [phone]   Fax:  [phone]  Name: Rebecca [name] MRN: [id number] Date of Birth: 1960-02-25

## 2014-12-04 ENCOUNTER — Telehealth: Payer: Self-pay | Admitting: *Deleted

## 2014-12-04 MED ORDER — FENTANYL 50 MCG/HR TD PT72: 50.0000 ug | MEDICATED_PATCH | TRANSDERMAL | Status: DC

## 2014-12-04 NOTE — Telephone Encounter (Signed)
Informed that prescription is ready to pick up  

## 2014-12-10 NOTE — Progress Notes (Signed)
[place]  [phone] [phone]  ID: Rebecca [name] OB: 06/20/1960  MR#: [id number]  KGY#:[id number]  Patient Care Team: Bobetta Lime, MD as PCP - General Bobetta Lime, MD as Referring Physician [name], MD (General Surgery)  CHIEF COMPLAINT:  Chief Complaint  Patient presents with  . Follow-up    INTERVAL HISTORY: Patient returns to clinic today for routine evaluation. Her pain is still evident, but improved. Her peripheral neuropathy is unchanged. She continues to have significant edema in her bilateral lower extremities. She does not report headache. She denies any chest pain or shortness of breath. She denies any nausea, vomiting, constipation, or diarrhea. She has no urinary complaints. Patient offers no further specific complaints.   REVIEW OF SYSTEMS:   Review of Systems  Constitutional: Positive for malaise/fatigue. Negative for fever.  Cardiovascular: Positive for leg swelling.  Musculoskeletal: Positive for joint pain.  Neurological: Positive for sensory change and weakness.    As per HPI. Otherwise, a complete review of systems is negatve.  PAST MEDICAL HISTORY: Past Medical History  Diagnosis Date  . Herpes zoster   . Multiple sclerosis ([place]) 2001    Walks from room to room @ home; but Wheelchair when going out.  . Closed head injury with brief loss of consciousness ([place])   . Seizures ([place])     Takes Keppra  . IBS (irritable bowel syndrome)     no longer  . Bacterial endocarditis     History of .  Marland Kitchen Aortic valve disease     Mild AS / AI (cannot exclude Bicuspid AoV)  . Syncope and collapse   . Bilateral lower extremity edema     Noncardiac.  Chronic. LE Venous dopplers - negative for DVT.; Echocardiogram January 2016: Normal EF with normal wall motion and valve function. Only grade 1 diastolic dysfunction. EF 60-65%. Mild MR  . Neuromuscular disorder ([place])     MS  . Heart murmur   . Lymphedema    has legs wrapped at [facility]  . Cervical stenosis of spine   . Cancer [facility]) 12-31-13    Right breast, 12:00, 1.5 cm, T1c,N0 invasive mammary carcinoma, triple negative.  . Breast cancer ([place])   . MS (multiple sclerosis) ([place])   . Irritable bowel     PAST SURGICAL HISTORY: Past Surgical History  Procedure Laterality Date  . Cholecystectomy    . Ankle surgery      Left  . Port-a-cath removal      right  . Anterior cervical decomp/discectomy fusion  11/17/2011    Procedure: ANTERIOR CERVICAL DECOMPRESSION/DISCECTOMY FUSION 2 LEVELS;  Surgeon: Erline Levine, MD;  Location: [place] NEURO ORS;  Service: Neurosurgery;  Laterality: N/A;  Cervical Five-Six Six-Seven Anterior cervical decompression/diskectomy/fusion  . Transthoracic echocardiogram  03/2013; 02/2014    a) Normal LV size and function with EF 60-65%.; Cannot exclude bicuspid aortic valve with mild AS and mild AI.; b) Normal EF with normal wall motion and valve function x Mild MR. G2 DD. EF 60-65%. Tricuspid AoV  . Lower extremity venous dopplers  Feb 27, 2013    No LE DVT  . Port a cath insertion Right 01/19/2010  . Colonoscopy  2014  . Upper gi endoscopy  2014  . Port-a-cath removal Right 09/03/2013    Procedure: REMOVAL PORT-A-CATH;  Surgeon: Conrad Franquez, MD;  Location: [place];  Service: Vascular;  Laterality: Right;  . Breast biopsy Right 12-31-13    invasive mammary  . Breast surgery  Right 02/03/2014    Right simple mastectomy with sentinel node biopsy.  . Mastectomy    . Ankle surgery      FAMILY HISTORY Family History  Problem Relation Age of Onset  . Cancer Father     skin  . Heart disease Father   . Heart attack Father   . Breast cancer Maternal Aunt 60  . Breast cancer Maternal Grandmother 74       ADVANCED DIRECTIVES:    HEALTH MAINTENANCE: Social History  Substance Use Topics  . Smoking status: Never Smoker   . Smokeless tobacco: Never Used  . Alcohol Use: No     Colonoscopy:  PAP:  Bone density:  Lipid  panel:  Allergies  Allergen Reactions  . Fentanyl Nausea And Vomiting and Nausea Only    vomiting Was given in PACU x3 each time patient got sick vomiting Was given in PACU x3 each time patient got sick  . Sulfa Antibiotics Hives and Other (See Comments)    Light headed, over heated    Current Outpatient Prescriptions  Medication Sig Dispense Refill  . amantadine (SYMMETREL) 100 MG capsule TAKE 3 CAPSULES BY MOUTH EVERY DAY 270 capsule 2  . Cholecalciferol (VITAMIN D3) 2000 UNITS capsule Take 2,000 Units by mouth daily.    . Cranberry 1000 MG CAPS Take by mouth daily.    Marland Kitchen GLUCOSAMINE-CHONDROITIN PO Take 1 tablet by mouth 3 (three) times daily. STRENGTH: 1500-1200    . levETIRAcetam (KEPPRA) 500 MG tablet Take 1 tablet (500 mg total) by mouth 2 (two) times daily. 60 tablet 12  . lidocaine-prilocaine (EMLA) cream Apply 1 application topically once. Apply to port then cover with saran wrap 1-2 hours before chemotherapy    . loratadine (CLARITIN) 10 MG tablet Take 10 mg by mouth daily.    . Misc Natural Products (LEG VEIN & CIRCULATION) TABS Take by mouth daily.    . Multiple Vitamins-Minerals (MULTIVITAMIN PO) Take 1 tablet by mouth daily.    Marland Kitchen oxybutynin (DITROPAN-XL) 10 MG 24 hr tablet Take 2 tablets (20 mg total) by mouth daily. 180 tablet 3  . Oxycodone HCl 10 MG TABS Take 1 tablet (10 mg total) by mouth every 6 (six) hours as needed. 120 tablet 0  . prochlorperazine (COMPAZINE) 10 MG tablet Take 10 mg by mouth every 6 (six) hours as needed.     . baclofen (LIORESAL) 10 MG tablet TAKE 2 TABLETS BY MOUTH IN THE MORNING, 1 TABLET AT LUNCH, AND 2 TABLETS AT NIGHT AS DIRECTED 150 tablet 6  . fentaNYL (DURAGESIC - DOSED MCG/HR) 50 MCG/HR Place 1 patch (50 mcg total) onto the skin every 3 (three) days. 10 patch 0  . ibuprofen (ADVIL,MOTRIN) 600 MG tablet TAKE 1 TABLET BY MOUTH EVERY 6 HOURS AS NEEDED 90 tablet 2  . interferon beta-1a (REBIF) 44 MCG/0.5ML SOSY injection Inject 44 mcg into  the skin 3 (three) times a week.     No current facility-administered medications for this visit.    OBJECTIVE: Filed Vitals:   11/26/14 1010  BP: 110/72  Pulse: 67  Temp: 97.8 F (36.6 C)  Resp: 18     There is no weight on file to calculate BMI.    ECOG FS:1 - Symptomatic but completely ambulatory  General: Well-developed, well-nourished, no acute distress. Sitting in a wheelchair. Eyes: anicteric sclera. Lungs: Clear to auscultation bilaterally. Heart: Regular rate and rhythm. No rubs, murmurs, or gallops. Abdomen: Soft, nontender, nondistended. No organomegaly noted, normoactive bowel sounds.  Musculoskeletal: Bilateral lower extremity lymphedema. Neuro: Alert, answering all questions appropriately. Cranial nerves grossly intact. Skin: No rashes or petechiae noted. Psych: Normal affect.   LAB RESULTS:  Lab Results  Component Value Date   NA 136 07/17/2014   K 3.6 07/17/2014   CL 104 07/17/2014   CO2 28 07/17/2014   GLUCOSE 93 07/17/2014   BUN 21* 07/17/2014   CREATININE 0.46 07/17/2014   CALCIUM 9.1 07/17/2014   PROT 7.4 07/17/2014   ALBUMIN 4.5 07/17/2014   AST 19 07/17/2014   ALT 16 07/17/2014   ALKPHOS 73 07/17/2014   BILITOT 0.5 07/17/2014   GFRNONAA >60 07/17/2014   GFRAA >60 07/17/2014    Lab Results  Component Value Date   WBC 3.3* 07/17/2014   NEUTROABS 2.0 07/17/2014   HGB 11.5* 07/17/2014   HCT 35.0 07/17/2014   MCV 92.7 07/17/2014   PLT 130* 07/17/2014     STUDIES: No results found.  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 BRCA negative.  PLAN:    1. Breast cancer: Because patient had a full mastectomy, she did not require adjuvant XRT. Given the fact that she is a triple negative cancer, she will not require an aromatase inhibitor. Given her difficulties with Taxol and multiple sclerosis, treatment was discontinued after a total of 6 of 12 cycles of Taxol. No further intervention is needed at this time.  Return to clinic in 6 weeks for laboratory work and further evaluation.  2. Multiple sclerosis: Patient has reinitiated treatments. 3. Lymphedema: Continue treatment and wraps as prescribed.  4. Peripheral neuropathy: Monitor. 5. Pain: Increase fentanyl patch to 50 g and continue 10 mg oxycodone for breakthrough pain.    Her primary neurologist is Dr. Andrey Spearman.  phone [phone], fax [phone].   Patient expressed understanding and was in agreement with this plan. She also understands that She can call clinic at any time with any questions, concerns, or complaints.   Breast cancer   Staging form: Breast, AJCC 7th Edition     Pathologic stage from 05/24/2014: Stage IA (T1c, N0, cM0) - Signed by Lloyd Huger, MD on 05/24/2014   Lloyd Huger, MD   12/10/2014 1:10 PM

## 2014-12-15 ENCOUNTER — Ambulatory Visit: Payer: Medicare Other | Attending: Family Medicine | Admitting: Occupational Therapy

## 2014-12-15 DIAGNOSIS — M6281 Muscle weakness (generalized): Secondary | ICD-10-CM | POA: Diagnosis not present

## 2014-12-15 DIAGNOSIS — Z741 Need for assistance with personal care: Secondary | ICD-10-CM

## 2014-12-15 DIAGNOSIS — I890 Lymphedema, not elsewhere classified: Secondary | ICD-10-CM

## 2014-12-15 DIAGNOSIS — R460 Very low level of personal hygiene: Secondary | ICD-10-CM | POA: Insufficient documentation

## 2014-12-16 NOTE — Therapy (Signed)
[place] MAIN [facility] SERVICES [address] [place], [place], [address] Phone: [phone]   Fax:  [phone]  Occupational Therapy Treatment  Patient Details  Name: Rebecca [name] MRN: [id number] Date of Birth: 1960-12-14 No Data Recorded  Encounter Date: 12/15/2014      OT End of Session - 12/15/14 2021    Visit Number 15   Number of Visits 36   Date for OT Re-Evaluation 01/21/15   Authorization Type Medicare G code visit 15   Authorization Time Period 20   OT Start Time [phone]   OT Stop Time 1055   OT Time Calculation (min) 56 min   Activity Tolerance Patient tolerated treatment well;No increased pain   Behavior During Therapy [facility] for tasks assessed/performed      Past Medical History  Diagnosis Date  . Herpes zoster   . Multiple sclerosis ([place]) 2001    Walks from room to room @ home; but Wheelchair when going out.  . Closed head injury with brief loss of consciousness ([place])   . Seizures ([place])     Takes Keppra  . IBS (irritable bowel syndrome)     no longer  . Bacterial endocarditis     History of .  Marland Kitchen Aortic valve disease     Mild AS / AI (cannot exclude Bicuspid AoV)  . Syncope and collapse   . Bilateral lower extremity edema     Noncardiac.  Chronic. LE Venous dopplers - negative for DVT.; Echocardiogram January 2016: Normal EF with normal wall motion and valve function. Only grade 1 diastolic dysfunction. EF 60-65%. Mild MR  . Neuromuscular disorder ([place])     MS  . Heart murmur   . Lymphedema     has legs wrapped at [facility]  . Cervical stenosis of spine   . Cancer [facility]) 12-31-13    Right breast, 12:00, 1.5 cm, T1c,N0 invasive mammary carcinoma, triple negative.  . Breast cancer ([place])   . MS (multiple sclerosis) ([place])   . Irritable bowel     Past Surgical History  Procedure Laterality Date  . Cholecystectomy    . Ankle surgery      Left  . Port-a-cath removal      right  . Anterior cervical decomp/discectomy  fusion  11/17/2011    Procedure: ANTERIOR CERVICAL DECOMPRESSION/DISCECTOMY FUSION 2 LEVELS;  Surgeon: Erline Levine, MD;  Location: [place] NEURO ORS;  Service: Neurosurgery;  Laterality: N/A;  Cervical Five-Six Six-Seven Anterior cervical decompression/diskectomy/fusion  . Transthoracic echocardiogram  03/2013; 02/2014    a) Normal LV size and function with EF 60-65%.; Cannot exclude bicuspid aortic valve with mild AS and mild AI.; b) Normal EF with normal wall motion and valve function x Mild MR. G2 DD. EF 60-65%. Tricuspid AoV  . Lower extremity venous dopplers  Feb 27, 2013    No LE DVT  . Port a cath insertion Right 01/19/2010  . Colonoscopy  2014  . Upper gi endoscopy  2014  . Port-a-cath removal Right 09/03/2013    Procedure: REMOVAL PORT-A-CATH;  Surgeon: Conrad [place], MD;  Location: [place];  Service: Vascular;  Laterality: Right;  . Breast biopsy Right 12-31-13    invasive mammary  . Breast surgery Right 02/03/2014    Right simple mastectomy with sentinel node biopsy.  . Mastectomy    . Ankle surgery      There were no vitals filed for this visit.  Visit Diagnosis:  Lymphedema  Muscle weakness (generalized)  Self-care  deficit for bathing and hygiene  Self-care deficit for dressing and grooming      Subjective Assessment - 12/15/14 2014    Subjective  Patient reports she had to remove her grey foam on the left ankle, states, it was bothering her.  Her husband did not rewrap her this week, wraps were kept on for a week.    Patient is accompained by: Family member   Patient Stated Goals Patient wants to get her legs back down to a smaller size, be able to transfer and walk better as well as be able to help more with her self care tasks, especially toileting.   Currently in Pain? No/denies   Pain Score 0-No pain   Multiple Pain Sites No                      OT Treatments/Exercises (OP) - 12/15/14 2018    Manual Therapy   Manual therapy comments Patient arrived  with bandages intact on bilateral lower extremities from foot to knee. Therapist removed bandages and performed skin inspection.  Circumferential measurements taken and recorded on flowsheet. Patient seen for Skin care of bilateral lower extremities followed with application of Eucerin lotion from foot to knee bilaterally. Stockinette applied from bilateral foot to knee.  Rosidal foam from foot to mid calf and then another  roll to knee bilaterally. Short stretch bandages applied as follows: one 8 cm bandage applied to foot and up to calf. One 10 cm bandage applied from foot to mid leg. One 12 cm bandage from mid leg to knee. All bandages applied with 50% stretch in a circular wrap pattern. Topper added to keep tape and bandages in place.  All new supplies used this date with bandages and padding.                OT Education - 12/15/14 2019    Education provided Yes   Education Details elevation, use of pump, bandaging   Person(s) Educated Patient;Spouse   Methods Explanation;Demonstration;Verbal cues   Comprehension Verbalized understanding;Returned demonstration;Verbal cues required             OT Long Term Goals - 10/22/14 1618    OT LONG TERM GOAL #1   Title . Patient to complete toileting with minimal assistance of one person in 12 weeks   Baseline min to mod assist, level flucuates daily   Time 12   Period Weeks   Status On-going   OT LONG TERM GOAL #2   Title Patient to complete bed to wheelchair transfer with minimal assist in 12 weeks.   Baseline mod to max assist, level flucuates   Time 12   Period Weeks   Status On-going   OT LONG TERM GOAL #3   Title Optimal edema control will be achieved, patient and caregiver to be independent in wearing of appropriate compression garments in 12 weeks.    Baseline increased edema and requiring compression bandaging to decongest legs.   Time 12   Period Weeks   Status On-going   OT LONG TERM GOAL #4   Title Husband to be  independent with donning and doffing of compression daytime and night time garments in 12 weeks   Baseline unable    Time 12   Period Weeks   Status On-going   OT LONG TERM GOAL #5   Title Optimal edema control will be achieved and patient will be measured for and appropriate compression garment in 12 weeks   Baseline   not yet decongested to be measured for custom garments   Time 12   Period Weeks   Status On-going   OT LONG TERM GOAL #6   Title Patient and family to be independent in skincare, HEP and self bandaging in 12 weeks   Baseline minimal cues and assist, familiar with compression bandaging from the past.   Time 12   Period Weeks   Status Partially Met   OT LONG TERM GOAL #7   Title  Patient will show a decrease in circumference in affected LE at toes 2cm , foot 3 cm ; ankle and calf 5cm, thighs 5 cm to fit in appropriate compression garments and in shoe in 12 weeks   Baseline see flowsheet for circumferential measurements   Time 12   Period Weeks   Status On-going               Plan - 12/15/14 2021    Clinical Impression Statement Patient has continued to make progress with decongestion over the last few weeks in bilateral lower extremities.  Will monitor this week and if numbers stabilize, will have her measured next week for custom compression garment fitting.     Pt will benefit from skilled therapeutic intervention in order to improve on the following deficits (Retired) Difficulty walking;Impaired flexibility;Increased edema;Decreased skin integrity;Decreased strength;Decreased mobility;Decreased balance;Decreased activity tolerance   Rehab Potential Good   OT Frequency 3x / week   OT Duration 12 weeks   OT Treatment/Interventions Self-care/ADL training;Therapeutic exercise;Functional Mobility Training;Patient/family education;Manual Therapy;Manual lymph drainage;DME and/or AE instruction;Compression bandaging   OT Home Exercise Plan Pt has exercises for lower  extremity to be performed at home in compression.   Consulted and Agree with Plan of Care Patient;Family member/caregiver   Family Member Consulted spouse- discussed and agreed on POC        Problem List Patient Active Problem List   Diagnosis Date Noted  . Heart murmur 10/24/2014  . 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 Lymphedema of both lower extremities 10/24/2014  . History of right mastectomy 10/24/2014  . Need for immunization against influenza 10/24/2014  . Medicare annual wellness visit, subsequent 10/24/2014  . MS (multiple sclerosis) ([place]) 09/16/2014  . Hematoma complicating a procedure 03/06/2014  . Breast cancer ([place]) 01/10/2014  . SOB (shortness of breath) 03/01/2013  . Bilateral lower extremity edema   . 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 Herpes zoster 06/08/2011  . Multiple sclerosis ([place])    Rebecca [name] Oneita Jolly, OTR/L, CLT Saree Krogh 12/16/2014, 8:24 PM  [place] MAIN [facility] SERVICES [address] [place], [place], [address] Phone: [phone]   Fax:  [phone]  Name: ZANIYAH WERNETTE MRN: [id number] Date of Birth: 1960-12-30

## 2014-12-17 ENCOUNTER — Ambulatory Visit: Payer: Medicare Other | Admitting: Occupational Therapy

## 2014-12-17 DIAGNOSIS — R460 Very low level of personal hygiene: Secondary | ICD-10-CM | POA: Diagnosis not present

## 2014-12-17 DIAGNOSIS — M6281 Muscle weakness (generalized): Secondary | ICD-10-CM

## 2014-12-17 DIAGNOSIS — I890 Lymphedema, not elsewhere classified: Secondary | ICD-10-CM

## 2014-12-17 DIAGNOSIS — Z741 Need for assistance with personal care: Secondary | ICD-10-CM

## 2014-12-18 NOTE — Therapy (Signed)
[place] MAIN [facility] SERVICES [address] [place], [place], [address] Phone: [phone]   Fax:  [phone]  Occupational Therapy Treatment  Patient Details  Name: Rebecca [name] MRN: [id number] Date of Birth: 08/26/1960 No Data Recorded  Encounter Date: 12/17/2014      OT End of Session - 12/17/14 1546    Visit Number 16   Number of Visits 36   Date for OT Re-Evaluation 01/21/15   Authorization Type Medicare G code visit 16   Authorization Time Period 20   OT Start Time 1005   OT Stop Time 1100   OT Time Calculation (min) 55 min   Activity Tolerance Patient tolerated treatment well;No increased pain   Behavior During Therapy [facility] for tasks assessed/performed      Past Medical History  Diagnosis Date  . Herpes zoster   . Multiple sclerosis ([place]) 2001    Walks from room to room @ home; but Wheelchair when going out.  . Closed head injury with brief loss of consciousness ([place])   . Seizures ([place])     Takes Keppra  . IBS (irritable bowel syndrome)     no longer  . Bacterial endocarditis     History of .  Marland Kitchen Aortic valve disease     Mild AS / AI (cannot exclude Bicuspid AoV)  . Syncope and collapse   . Bilateral lower extremity edema     Noncardiac.  Chronic. LE Venous dopplers - negative for DVT.; Echocardiogram January 2016: Normal EF with normal wall motion and valve function. Only grade 1 diastolic dysfunction. EF 60-65%. Mild MR  . Neuromuscular disorder ([place])     MS  . Heart murmur   . Lymphedema     has legs wrapped at [facility]  . Cervical stenosis of spine   . Cancer [facility]) 12-31-13    Right breast, 12:00, 1.5 cm, T1c,N0 invasive mammary carcinoma, triple negative.  . Breast cancer ([place])   . MS (multiple sclerosis) ([place])   . Irritable bowel     Past Surgical History  Procedure Laterality Date  . Cholecystectomy    . Ankle surgery      Left  . Port-a-cath removal      right  . Anterior cervical decomp/discectomy  fusion  11/17/2011    Procedure: ANTERIOR CERVICAL DECOMPRESSION/DISCECTOMY FUSION 2 LEVELS;  Surgeon: Erline Levine, MD;  Location: [place] NEURO ORS;  Service: Neurosurgery;  Laterality: N/A;  Cervical Five-Six Six-Seven Anterior cervical decompression/diskectomy/fusion  . Transthoracic echocardiogram  03/2013; 02/2014    a) Normal LV size and function with EF 60-65%.; Cannot exclude bicuspid aortic valve with mild AS and mild AI.; b) Normal EF with normal wall motion and valve function x Mild MR. G2 DD. EF 60-65%. Tricuspid AoV  . Lower extremity venous dopplers  Feb 27, 2013    No LE DVT  . Port a cath insertion Right 01/19/2010  . Colonoscopy  2014  . Upper gi endoscopy  2014  . Port-a-cath removal Right 09/03/2013    Procedure: REMOVAL PORT-A-CATH;  Surgeon: Conrad Thurman, MD;  Location: [place];  Service: Vascular;  Laterality: Right;  . Breast biopsy Right 12-31-13    invasive mammary  . Breast surgery Right 02/03/2014    Right simple mastectomy with sentinel node biopsy.  . Mastectomy    . Ankle surgery      There were no vitals filed for this visit.  Visit Diagnosis:  Lymphedema  Muscle weakness (generalized)  Self-care  deficit for bathing and hygiene  Self-care deficit for dressing and grooming      Subjective Assessment - 12/17/14 1543    Subjective  Patient declines use of grey foam this date, "I have had to remove it bc it gets uncomfortable so I do not want it on today."  Discussed the need to have husband unwrap/rewrap her in a couple days and he may have to do it sooner if area causes any pain or discomfort.    Patient is accompained by: Family member   Patient Stated Goals Patient wants to get her legs back down to a smaller size, be able to transfer and walk better as well as be able to help more with her self care tasks, especially toileting.   Currently in Pain? No/denies   Pain Score 0-No pain                      OT Treatments/Exercises (OP) - 12/17/14  2019    Manual Therapy   Manual therapy comments Patient arrived with bandages intact on bilateral lower extremities from foot to knee. Therapist removed bandages and performed skin inspection.  Circumferential measurements taken and recorded on flowsheet. Patient seen for Skin care of bilateral lower extremities followed with application of Eucerin lotion from foot to knee bilaterally. Stockinette applied from bilateral foot to knee.  Rosidal foam from foot to mid calf and then another  roll to knee bilaterally. Short stretch bandages applied as follows: one 8 cm bandage applied to foot and up to calf. One 10 cm bandage applied from foot to mid leg. One 12 cm bandage from mid leg to knee. All bandages applied with 50% stretch in a circular wrap pattern. Topper added to keep tape and bandages in place.  All new supplies used this date with bandages and padding.                OT Education - 12/17/14 1545    Education provided Yes   Education Details reinstruction on use of pump, lift chair to elevate legs, compression bandaging   Person(s) Educated Patient;Spouse   Methods Explanation;Demonstration;Verbal cues   Comprehension Verbal cues required;Returned demonstration;Verbalized understanding             OT Long Term Goals - 10/22/14 1618    OT LONG TERM GOAL #1   Title . Patient to complete toileting with minimal assistance of one person in 12 weeks   Baseline min to mod assist, level flucuates daily   Time 12   Period Weeks   Status On-going   OT LONG TERM GOAL #2   Title Patient to complete bed to wheelchair transfer with minimal assist in 12 weeks.   Baseline mod to max assist, level flucuates   Time 12   Period Weeks   Status On-going   OT LONG TERM GOAL #3   Title Optimal edema control will be achieved, patient and caregiver to be independent in wearing of appropriate compression garments in 12 weeks.    Baseline increased edema and requiring compression bandaging  to decongest legs.   Time 12   Period Weeks   Status On-going   OT LONG TERM GOAL #4   Title Husband to be independent with donning and doffing of compression daytime and night time garments in 12 weeks   Baseline unable    Time 12   Period Weeks   Status On-going   OT LONG TERM GOAL #5   Title Optimal  edema control will be achieved and patient will be measured for and appropriate compression garment in 12 weeks   Baseline not yet decongested to be measured for custom garments   Time 12   Period Weeks   Status On-going   OT LONG TERM GOAL #6   Title Patient and family to be independent in skincare, HEP and self bandaging in 12 weeks   Baseline minimal cues and assist, familiar with compression bandaging from the past.   Time 12   Period Weeks   Status Partially Met   OT LONG TERM GOAL #7   Title  Patient will show a decrease in circumference in affected LE at toes 2cm , foot 3 cm ; ankle and calf 5cm, thighs 5 cm to fit in appropriate compression garments and in shoe in 12 weeks   Baseline see flowsheet for circumferential measurements   Time 12   Period Weeks   Status On-going               Plan - 12/17/14 1546    Clinical Impression Statement Patient continues to demonstrate decreases in circumferential measurements this week, will plan to contact vendor regarding measurements next week for custom compression garments.  Slight redness noted on left ankle dorsal side however patient did not want to utilize grey foam to distribute pressure.  Advised her she may need husband to check area often.    Pt will benefit from skilled therapeutic intervention in order to improve on the following deficits (Retired) Difficulty walking;Impaired flexibility;Increased edema;Decreased skin integrity;Decreased strength;Decreased mobility;Decreased balance;Decreased activity tolerance   Rehab Potential Good   OT Frequency 3x / week   OT Duration 12 weeks   OT Treatment/Interventions  Self-care/ADL training;Therapeutic exercise;Functional Mobility Training;Patient/family education;Manual Therapy;Manual lymph drainage;DME and/or AE instruction;Compression bandaging   OT Home Exercise Plan Pt has exercises for lower extremity to be performed at home in compression.   Consulted and Agree with Plan of Care Patient;Family member/caregiver   Family Member Consulted spouse- discussed and agreed on POC        Problem List Patient Active Problem List   Diagnosis Date Noted  . Heart murmur 10/24/2014  . 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 Lymphedema of both lower extremities 10/24/2014  . History of right mastectomy 10/24/2014  . Need for immunization against influenza 10/24/2014  . Medicare annual wellness visit, subsequent 10/24/2014  . MS (multiple sclerosis) ([place]) 09/16/2014  . Hematoma complicating a procedure 03/06/2014  . Breast cancer ([place]) 01/10/2014  . SOB (shortness of breath) 03/01/2013  . Bilateral lower extremity edema   . 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 Herpes zoster 06/08/2011  . Multiple sclerosis ([place])    Idabelle Mcpeters Oneita Jolly, OTR/L, CLT  Dravyn Severs 12/18/2014, 8:27 PM  [place] MAIN [facility] SERVICES [address] [place], [place], [address] Phone: [phone]   Fax:  [phone]  Name: AUDRY KAUZLARICH MRN: [id number] Date of Birth: Dec 24, 1960

## 2014-12-22 ENCOUNTER — Ambulatory Visit: Payer: Medicare Other | Admitting: Occupational Therapy

## 2014-12-30 ENCOUNTER — Encounter: Payer: Self-pay | Admitting: Occupational Therapy

## 2014-12-30 ENCOUNTER — Ambulatory Visit: Payer: Medicare Other | Admitting: Occupational Therapy

## 2014-12-30 DIAGNOSIS — I890 Lymphedema, not elsewhere classified: Secondary | ICD-10-CM

## 2014-12-30 DIAGNOSIS — Z741 Need for assistance with personal care: Secondary | ICD-10-CM

## 2014-12-30 DIAGNOSIS — R460 Very low level of personal hygiene: Secondary | ICD-10-CM

## 2014-12-30 DIAGNOSIS — M6281 Muscle weakness (generalized): Secondary | ICD-10-CM

## 2014-12-31 ENCOUNTER — Other Ambulatory Visit: Payer: Self-pay | Admitting: Diagnostic Neuroimaging

## 2014-12-31 NOTE — Telephone Encounter (Signed)
Pt called for refill on Keppra.

## 2014-12-31 NOTE — Therapy (Signed)
[place] MAIN [facility] SERVICES [address] [place], [place], [address] Phone: [phone]   Fax:  [phone]  Occupational Therapy Treatment  Patient Details  Name: Rebecca [name] MRN: [id number] Date of Birth: 1961-01-13 No Data Recorded  Encounter Date: 12/30/2014      OT End of Session - 12/30/14 2031    Visit Number 17   Number of Visits 36   Date for OT Re-Evaluation 01/21/15   Authorization Type Medicare G code visit 17   Authorization Time Period 20   OT Start Time 1003   OT Stop Time 1058   OT Time Calculation (min) 55 min   Equipment Utilized During Treatment donning gloves for custom compression garments.   Activity Tolerance Patient tolerated treatment well;No increased pain   Behavior During Therapy [facility] for tasks assessed/performed      Past Medical History  Diagnosis Date  . Herpes zoster   . Multiple sclerosis ([place]) 2001    Walks from room to room @ home; but Wheelchair when going out.  . Closed head injury with brief loss of consciousness ([place])   . Seizures ([place])     Takes Keppra  . IBS (irritable bowel syndrome)     no longer  . Bacterial endocarditis     History of .  Marland Kitchen Aortic valve disease     Mild AS / AI (cannot exclude Bicuspid AoV)  . Syncope and collapse   . Bilateral lower extremity edema     Noncardiac.  Chronic. LE Venous dopplers - negative for DVT.; Echocardiogram January 2016: Normal EF with normal wall motion and valve function. Only grade 1 diastolic dysfunction. EF 60-65%. Mild MR  . Neuromuscular disorder ([place])     MS  . Heart murmur   . Lymphedema     has legs wrapped at [facility]  . Cervical stenosis of spine   . Cancer [facility]) 12-31-13    Right breast, 12:00, 1.5 cm, T1c,N0 invasive mammary carcinoma, triple negative.  . Breast cancer ([place])   . MS (multiple sclerosis) ([place])   . Irritable bowel     Past Surgical History  Procedure Laterality Date  . Cholecystectomy    . Ankle surgery       Left  . Port-a-cath removal      right  . Anterior cervical decomp/discectomy fusion  11/17/2011    Procedure: ANTERIOR CERVICAL DECOMPRESSION/DISCECTOMY FUSION 2 LEVELS;  Surgeon: Erline Levine, MD;  Location: [place] NEURO ORS;  Service: Neurosurgery;  Laterality: N/A;  Cervical Five-Six Six-Seven Anterior cervical decompression/diskectomy/fusion  . Transthoracic echocardiogram  03/2013; 02/2014    a) Normal LV size and function with EF 60-65%.; Cannot exclude bicuspid aortic valve with mild AS and mild AI.; b) Normal EF with normal wall motion and valve function x Mild MR. G2 DD. EF 60-65%. Tricuspid AoV  . Lower extremity venous dopplers  Feb 27, 2013    No LE DVT  . Port a cath insertion Right 01/19/2010  . Colonoscopy  2014  . Upper gi endoscopy  2014  . Port-a-cath removal Right 09/03/2013    Procedure: REMOVAL PORT-A-CATH;  Surgeon: Conrad Screven, MD;  Location: [place];  Service: Vascular;  Laterality: Right;  . Breast biopsy Right 12-31-13    invasive mammary  . Breast surgery Right 02/03/2014    Right simple mastectomy with sentinel node biopsy.  . Mastectomy    . Ankle surgery      There were no vitals filed for this  visit.  Visit Diagnosis:  Lymphedema  Muscle weakness (generalized)  Self-care deficit for bathing and hygiene  Self-care deficit for dressing and grooming      Subjective Assessment - 12/30/14 2029    Subjective  Patient reports she has been mostly wearing the Reid sleeves since last week on her legs.  Had some difficulty with placing daytime compression stockings on.     Patient is accompained by: Family member   Patient Stated Goals Patient wants to get her legs back down to a smaller size, be able to transfer and walk better as well as be able to help more with her self care tasks, especially toileting.   Currently in Pain? No/denies   Pain Score 0-No pain             LYMPHEDEMA/ONCOLOGY QUESTIONNAIRE - 12/30/14 1013    Right Lower Extremity  Lymphedema   At Midpatella/Popliteal Crease 42.5 cm   30 cm Proximal to Floor at Lateral Plantar Foot 40.2 cm   20 cm Proximal to Floor at Lateral Plantar Foot 32._0 cm Proximal to Floor at Lateral Malleoli 26 cm   5 cm Proximal to 1st MTP Joint 22.4 cm   Across MTP Joint 23.2 cm   Around Proximal Great Toe 8.8 cm   Other 31.2   Left Lower Extremity Lymphedema   At Midpatella/Popliteal Crease 39.4 cm   30 cm Proximal to Floor at Lateral Plantar Foot 39.6 cm   20 cm Proximal to Floor at Lateral Plantar Foot 32.2 cm   10 cm Proximal to Floor at Lateral Malleoli 26.6 cm   5 cm Proximal to 1st MTP Joint 23.2 cm   Across MTP Joint 22.5 cm   Around Proximal Great Toe 9.1 cm   Other 32.0                 OT Treatments/Exercises (OP) - 12/30/14 1022    ADLs   ADL Comments Patient seen for application of daytime garments bilateral thigh highs to both legs to determine fit.  Patient instructed on donning and doffing as well as wear schedule, care of garment.  Patient and husband demo understanding.     Manual Therapy   Manual therapy comments Patient arrived with night time Reid sleeves in place on bilateral LEs, knee highs.  Removed garments, skin inspected with no problems noted.  Circumferential measurements taken and recorded on flow sheet.                OT Education - 12/30/14 2030    Education provided Yes   Education Details Reid sleeves for night time wear, daytime compression garments, wear, care and schedule.  Use of gloves to don and doff compression garments.   Person(s) Educated Patient;Spouse   Methods Explanation;Demonstration;Verbal cues   Comprehension Verbal cues required;Returned demonstration;Verbalized understanding             OT Long Term Goals - 10/22/14 1618    OT LONG TERM GOAL #1   Title . Patient to complete toileting with minimal assistance of one person in 12 weeks   Baseline min to mod assist, level flucuates daily   Time 12    Period Weeks   Status On-going   OT LONG TERM GOAL #2   Title Patient to complete bed to wheelchair transfer with minimal assist in 12 weeks.   Baseline mod to max assist, level flucuates   Time 12   Period Weeks   Status On-going  OT LONG TERM GOAL #3   Title Optimal edema control will be achieved, patient and caregiver to be independent in wearing of appropriate compression garments in 12 weeks.    Baseline increased edema and requiring compression bandaging to decongest legs.   Time 12   Period Weeks   Status On-going   OT LONG TERM GOAL #4   Title Husband to be independent with donning and doffing of compression daytime and night time garments in 12 weeks   Baseline unable    Time 12   Period Weeks   Status On-going   OT LONG TERM GOAL #5   Title Optimal edema control will be achieved and patient will be measured for and appropriate compression garment in 12 weeks   Baseline not yet decongested to be measured for custom garments   Time 12   Period Weeks   Status On-going   OT LONG TERM GOAL #6   Title Patient and family to be independent in skincare, HEP and self bandaging in 12 weeks   Baseline minimal cues and assist, familiar with compression bandaging from the past.   Time 12   Period Weeks   Status Partially Met   OT LONG TERM GOAL #7   Title  Patient will show a decrease in circumference in affected LE at toes 2cm , foot 3 cm ; ankle and calf 5cm, thighs 5 cm to fit in appropriate compression garments and in shoe in 12 weeks   Baseline see flowsheet for circumferential measurements   Time 12   Period Weeks   Status On-going               Plan - 12/30/14 2032    Clinical Impression Statement Patient was able to fit into her previous Ginger Organ, she has one pair of daytime thigh highs and one ordered for wash and wear.  She felt they had difficulty trying to get daytime custom compression garments on so focused this session on donning and doffing with  use of rubber gloves.  Husband will obtain a pair to use.  Patient 's circumferential measurements up slightly so plan to have patient return in one week to monitor measurements and make adjustments as needed.  She may also need instruction on alternative donning aids if they have continued difficulty at home with application of daytime compression garments.     Pt will benefit from skilled therapeutic intervention in order to improve on the following deficits (Retired) Difficulty walking;Impaired flexibility;Increased edema;Decreased skin integrity;Decreased strength;Decreased mobility;Decreased balance;Decreased activity tolerance   Rehab Potential Good   OT Frequency 3x / week   OT Duration 12 weeks   OT Treatment/Interventions Self-care/ADL training;Therapeutic exercise;Functional Mobility Training;Patient/family education;Manual Therapy;Manual lymph drainage;DME and/or AE instruction;Compression bandaging   Consulted and Agree with Plan of Care Patient;Family member/caregiver   Family Member Consulted spouse- discussed and agreed on POC        Problem List Patient Active Problem List   Diagnosis Date Noted  . Heart murmur 10/24/2014  . 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 Lymphedema of both lower extremities 10/24/2014  . History of right mastectomy 10/24/2014  . Need for immunization against influenza 10/24/2014  . Medicare annual wellness visit, subsequent 10/24/2014  . MS (multiple sclerosis) ([place]) 09/16/2014  . Hematoma complicating a procedure 03/06/2014  . Breast cancer ([place]) 01/10/2014  . SOB (shortness of breath) 03/01/2013  . Bilateral lower extremity edema   . 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 Herpes zoster 06/08/2011  .  Multiple sclerosis ([place])    Saagar Tortorella Oneita Jolly, OTR/L, CLT Yunus Stoklosa 12/31/2014, 10:32 AM  [place] MAIN [facility] SERVICES [address] [place], [place], [address] Phone: [phone]   Fax:   [phone]  Name: WILLOWDEAN LUHMANN MRN: [id number] Date of Birth: October 04, 1960

## 2015-01-05 ENCOUNTER — Telehealth: Payer: Self-pay | Admitting: *Deleted

## 2015-01-05 MED ORDER — FENTANYL 50 MCG/HR TD PT72: 50.0000 ug | MEDICATED_PATCH | TRANSDERMAL | Status: DC

## 2015-01-05 NOTE — Telephone Encounter (Signed)
Informed that prescription is ready to pick up  

## 2015-01-08 ENCOUNTER — Inpatient Hospital Stay: Payer: Medicare Other

## 2015-01-08 ENCOUNTER — Inpatient Hospital Stay: Payer: Medicare Other | Admitting: Oncology

## 2015-01-09 ENCOUNTER — Ambulatory Visit: Payer: Medicare Other | Attending: Family Medicine | Admitting: Occupational Therapy

## 2015-01-09 ENCOUNTER — Encounter: Payer: Self-pay | Admitting: Occupational Therapy

## 2015-01-09 DIAGNOSIS — M6281 Muscle weakness (generalized): Secondary | ICD-10-CM

## 2015-01-09 DIAGNOSIS — Z741 Need for assistance with personal care: Secondary | ICD-10-CM

## 2015-01-09 DIAGNOSIS — I890 Lymphedema, not elsewhere classified: Secondary | ICD-10-CM

## 2015-01-09 DIAGNOSIS — R460 Very low level of personal hygiene: Secondary | ICD-10-CM | POA: Insufficient documentation

## 2015-01-09 NOTE — Therapy (Signed)
[place] MAIN [facility] SERVICES [address] [place], [place], [address] Phone: [phone]   Fax:  [phone]  Occupational Therapy Treatment  Patient Details  Name: Rebecca [name] MRN: [id number] Date of Birth: 02-01-1961 No Data Recorded  Encounter Date: 01/09/2015      OT End of Session - 01/09/15 1642    Visit Number 18   Number of Visits 36   Date for OT Re-Evaluation 01/21/15   Authorization Type Medicare G code visit 18   Authorization Time Period 20   OT Start Time 1340   OT Stop Time 1426   OT Time Calculation (min) 46 min   Equipment Utilized During Treatment donning gloves for custom compression garments., compression stocking donner.   Activity Tolerance Patient tolerated treatment well;No increased pain   Behavior During Therapy [facility] for tasks assessed/performed      Past Medical History  Diagnosis Date  . Herpes zoster   . Multiple sclerosis ([place]) 2001    Walks from room to room @ home; but Wheelchair when going out.  . Closed head injury with brief loss of consciousness ([place])   . Seizures ([place])     Takes Keppra  . IBS (irritable bowel syndrome)     no longer  . Bacterial endocarditis     History of .  Marland Kitchen Aortic valve disease     Mild AS / AI (cannot exclude Bicuspid AoV)  . Syncope and collapse   . Bilateral lower extremity edema     Noncardiac.  Chronic. LE Venous dopplers - negative for DVT.; Echocardiogram January 2016: Normal EF with normal wall motion and valve function. Only grade 1 diastolic dysfunction. EF 60-65%. Mild MR  . Neuromuscular disorder ([place])     MS  . Heart murmur   . Lymphedema     has legs wrapped at [facility]  . Cervical stenosis of spine   . Cancer [facility]) 12-31-13    Right breast, 12:00, 1.5 cm, T1c,N0 invasive mammary carcinoma, triple negative.  . Breast cancer ([place])   . MS (multiple sclerosis) ([place])   . Irritable bowel     Past Surgical History  Procedure Laterality Date  .  Cholecystectomy    . Ankle surgery      Left  . Port-a-cath removal      right  . Anterior cervical decomp/discectomy fusion  11/17/2011    Procedure: ANTERIOR CERVICAL DECOMPRESSION/DISCECTOMY FUSION 2 LEVELS;  Surgeon: Erline Levine, MD;  Location: [place] NEURO ORS;  Service: Neurosurgery;  Laterality: N/A;  Cervical Five-Six Six-Seven Anterior cervical decompression/diskectomy/fusion  . Transthoracic echocardiogram  03/2013; 02/2014    a) Normal LV size and function with EF 60-65%.; Cannot exclude bicuspid aortic valve with mild AS and mild AI.; b) Normal EF with normal wall motion and valve function x Mild MR. G2 DD. EF 60-65%. Tricuspid AoV  . Lower extremity venous dopplers  Feb 27, 2013    No LE DVT  . Port a cath insertion Right 01/19/2010  . Colonoscopy  2014  . Upper gi endoscopy  2014  . Port-a-cath removal Right 09/03/2013    Procedure: REMOVAL PORT-A-CATH;  Surgeon: Conrad Hydaburg, MD;  Location: [place];  Service: Vascular;  Laterality: Right;  . Breast biopsy Right 12-31-13    invasive mammary  . Breast surgery Right 02/03/2014    Right simple mastectomy with sentinel node biopsy.  . Mastectomy    . Ankle surgery      There were no vitals  filed for this visit.  Visit Diagnosis:  Lymphedema  Muscle weakness (generalized)  Self-care deficit for bathing and hygiene  Self-care deficit for dressing and grooming      Subjective Assessment - 01/09/15 1637    Subjective  Patient reports she has been consistent with wearing compression this past week, mostly Ginger Organ since they appear to be somewhat easier for her husband to don and comfortable for her.    Patient is accompained by: Family member   Patient Stated Goals Patient wants to get her legs back down to a smaller size, be able to transfer and walk better as well as be able to help more with her self care tasks, especially toileting.   Currently in Pain? No/denies   Pain Score 0-No pain              LYMPHEDEMA/ONCOLOGY QUESTIONNAIRE - 01/09/15 1343    Right Lower Extremity Lymphedema   At Midpatella/Popliteal Crease 42 cm   30 cm Proximal to Floor at Lateral Plantar Foot 38.1 cm   20 cm Proximal to Floor at Lateral Plantar Foot 31.$RemoveBeforeD'2 1   10 'qaSbYFwaHdXbQB$ cm Proximal to Floor at Lateral Malleoli 25.7 cm   5 cm Proximal to 1st MTP Joint 23.7 cm   Across MTP Joint 23.3 cm   Around Proximal Great Toe 8.8 cm   Other 31.3   Left Lower Extremity Lymphedema   At Midpatella/Popliteal Crease 39.8 cm   30 cm Proximal to Floor at Lateral Plantar Foot 39.1 cm   20 cm Proximal to Floor at Lateral Plantar Foot 32 cm   10 cm Proximal to Floor at Lateral Malleoli 26.2 cm   5 cm Proximal to 1st MTP Joint 23.2 cm   Across MTP Joint 22.7 cm   Around Proximal Great Toe 9 cm   Other 32.4                 OT Treatments/Exercises (OP) - 01/09/15 1639    ADLs   ADL Comments Application of compression garment for daytime with use of adaptive equipment compression stocking aid for husband to assist with.  Patient was able to help pull with the strings to pull onto foot.  Requires assist to stand and then assist to pull up thigh high stockings (max assist)   Manual Therapy   Manual therapy comments Patient arrived with night time Reid sleeves in place on bilateral LEs, knee highs. Removed garments, skin inspected with no problems noted. Circumferential measurements taken and recorded on flow sheet.                OT Education - 01/09/15 1641    Education provided Yes   Education Details Adaptive equipment use for donning of compression garments for daytime.  HEP.  Potential benefit of PT for strengthening and functional transfers/mobility.   Person(s) Educated Patient;Spouse   Methods Explanation;Demonstration;Verbal cues   Comprehension Verbal cues required;Returned demonstration;Verbalized understanding             OT Long Term Goals - 01/09/15 1647    OT LONG TERM GOAL #1   Title .  Patient to complete toileting with minimal assistance of one person in 12 weeks   Time 12   Period Weeks   Status Achieved   OT LONG TERM GOAL #2   Title Patient to complete bed to wheelchair transfer with minimal assist in 12 weeks.   Time 12   Period Weeks   Status Achieved   OT LONG TERM GOAL #  3   Title Optimal edema control will be achieved, patient and caregiver to be independent in wearing of appropriate compression garments in 12 weeks.    Time 12   Period Weeks   Status Achieved   OT LONG TERM GOAL #4   Title Husband to be independent with donning and doffing of compression daytime and night time garments in 12 weeks   Time 12   Period Weeks   Status Achieved   OT LONG TERM GOAL #5   Title Optimal edema control will be achieved and patient will be measured for and appropriate compression garment in 12 weeks   Time 12   Period Weeks   Status Achieved   OT LONG TERM GOAL #6   Title Patient and family to be independent in skincare, HEP and self bandaging in 12 weeks   Time 12   Period Weeks   Status Achieved   OT LONG TERM GOAL #7   Title  Patient will show a decrease in circumference in affected LE at toes 2cm , foot 3 cm ; ankle and calf 5cm, thighs 5 cm to fit in appropriate compression garments and in shoe in 12 weeks   Time 12   Period Weeks   Status Achieved               Plan - 2015-01-31 1642    Clinical Impression Statement Patient's circumferential measurements have stayed stable since fitting and wearing of compression garments.  Skin intact and appears pink, more supple than in the past and less fibrotic in nature.  She will need to continue to follow the routine of wearing compression all the time with the exception of performing self care tasks.  Recommend daytime custom thigh highs for day  and then transition to [employer], knee high for night time.  Recommend patient utilize her compression pump 1-2 times a day for 45 min to 1 hour.  Patient's  husband able to demo understanding of adaptive equipment and has the information for possible ordering of device for assistance with donning.  Recommend patient call MD and seek PT referral for functional mobility, transfer training and strengthening now that her lymphedema is stable and she has compression garments in place.  Will discharge patient at this time with goals met.    Pt will benefit from skilled therapeutic intervention in order to improve on the following deficits (Retired) Difficulty walking;Impaired flexibility;Increased edema;Decreased skin integrity;Decreased strength;Decreased mobility;Decreased balance;Decreased activity tolerance   Rehab Potential Good   OT Frequency 3x / week   OT Duration 12 weeks   OT Treatment/Interventions Self-care/ADL training;Therapeutic exercise;Functional Mobility Training;Patient/family education;Manual Therapy;Manual lymph drainage;DME and/or AE instruction;Compression bandaging   Consulted and Agree with Plan of Care Patient;Family member/caregiver   Family Member Consulted spouse- discussed and agreed on POC          G-Codes - 01/31/15 05/08/46    Functional Assessment Tool Used clinical judgment, circumferential measurements, ADL assessment   Functional Limitation Self care   Self Care Current Status [phone]) At least 80 percent but less than 100 percent impaired, limited or restricted   Self Care Discharge Status [phone]) At least 60 percent but less than 80 percent impaired, limited or restricted      Problem List Patient Active Problem List   Diagnosis Date Noted  . Heart murmur 10/24/2014  . 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 Lymphedema of both lower extremities 10/24/2014  . History of right mastectomy 10/24/2014  . Need for immunization against  influenza 10/24/2014  . Medicare annual wellness visit, subsequent 10/24/2014  . MS (multiple sclerosis) ([place]) 09/16/2014  . Hematoma complicating a procedure 03/06/2014  . Breast cancer ([place])  01/10/2014  . SOB (shortness of breath) 03/01/2013  . Bilateral lower extremity edema   . 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 Herpes zoster 06/08/2011  . Multiple sclerosis ([place])    Joeline Freer Oneita Jolly, OTR/L, CLT Khai Arrona 01/09/2015, 4:50 PM  [place] MAIN [facility] SERVICES [address] [place], [place], [address] Phone: [phone]   Fax:  [phone]  Name: YASMIN BRONAUGH MRN: [id number] Date of Birth: 02/20/1960

## 2015-01-15 ENCOUNTER — Inpatient Hospital Stay (HOSPITAL_BASED_OUTPATIENT_CLINIC_OR_DEPARTMENT_OTHER): Payer: Medicare Other | Admitting: Oncology

## 2015-01-15 ENCOUNTER — Inpatient Hospital Stay: Payer: Medicare Other | Attending: Oncology

## 2015-01-15 ENCOUNTER — Inpatient Hospital Stay: Payer: Medicare Other

## 2015-01-15 DIAGNOSIS — G35 Multiple sclerosis: Secondary | ICD-10-CM | POA: Diagnosis not present

## 2015-01-15 DIAGNOSIS — Z9221 Personal history of antineoplastic chemotherapy: Secondary | ICD-10-CM | POA: Diagnosis not present

## 2015-01-15 DIAGNOSIS — G629 Polyneuropathy, unspecified: Secondary | ICD-10-CM | POA: Insufficient documentation

## 2015-01-15 DIAGNOSIS — I890 Lymphedema, not elsewhere classified: Secondary | ICD-10-CM | POA: Diagnosis not present

## 2015-01-15 DIAGNOSIS — Z79899 Other long term (current) drug therapy: Secondary | ICD-10-CM

## 2015-01-15 DIAGNOSIS — C50912 Malignant neoplasm of unspecified site of left female breast: Secondary | ICD-10-CM

## 2015-01-15 DIAGNOSIS — Z171 Estrogen receptor negative status [ER-]: Secondary | ICD-10-CM | POA: Diagnosis not present

## 2015-01-15 DIAGNOSIS — Z9013 Acquired absence of bilateral breasts and nipples: Secondary | ICD-10-CM | POA: Diagnosis not present

## 2015-01-15 DIAGNOSIS — C50911 Malignant neoplasm of unspecified site of right female breast: Secondary | ICD-10-CM

## 2015-01-15 DIAGNOSIS — Z853 Personal history of malignant neoplasm of breast: Secondary | ICD-10-CM | POA: Diagnosis not present

## 2015-01-15 MED ORDER — HEPARIN SOD (PORK) LOCK FLUSH 100 UNIT/ML IV SOLN
INTRAVENOUS | Status: AC
  Filled 2015-01-15: qty 5

## 2015-01-15 MED ORDER — HEPARIN SOD (PORK) LOCK FLUSH 100 UNIT/ML IV SOLN
500.0000 [IU] | Freq: Once | INTRAVENOUS | Status: AC
  Administered 2015-01-15: 500 [IU] via INTRAVENOUS

## 2015-01-15 MED ORDER — SODIUM CHLORIDE 0.9 % IJ SOLN
10.0000 mL | Freq: Once | INTRAMUSCULAR | Status: AC
  Administered 2015-01-15: 10 mL via INTRAVENOUS
  Filled 2015-01-15: qty 10

## 2015-01-16 LAB — CANCER ANTIGEN 27.29: CA 27.29: 19.8 U/mL (ref 0.0–38.6)

## 2015-01-21 ENCOUNTER — Telehealth: Payer: Self-pay | Admitting: *Deleted

## 2015-01-21 DIAGNOSIS — C50919 Malignant neoplasm of unspecified site of unspecified female breast: Secondary | ICD-10-CM

## 2015-01-21 MED ORDER — OXYCODONE HCL 10 MG PO TABS
10.0000 mg | ORAL_TABLET | Freq: Four times a day (QID) | ORAL | Status: DC | PRN
Start: 2015-01-21 — End: 2015-03-09

## 2015-01-21 NOTE — Telephone Encounter (Signed)
Informed that prescription is ready to pick up  

## 2015-02-06 NOTE — Progress Notes (Signed)
Rebecca [name]  [phone]) [phone] [phone]  ID: [name] OB: 10/03/1960  MR#: [id number]  GSU#:[id number]  Patient Care Team: Bobetta Lime, MD as PCP - General Bobetta Lime, MD as Referring Physician [name], MD (General Surgery)  CHIEF COMPLAINT:  Chief Complaint  Patient presents with  . Breast Cancer    INTERVAL HISTORY: Patient returns to clinic today for routine evaluation. Her pain is effectively better controlled on her current narcotic regimen. Her peripheral neuropathy is unchanged. Her lower extremity edema is also significantly improved. She does not report headache today. She denies any chest pain or shortness of breath. She denies any nausea, vomiting, constipation, or diarrhea. She has no urinary complaints. Patient offers no further specific complaints.   REVIEW OF SYSTEMS:   Review of Systems  Constitutional: Positive for malaise/fatigue. Negative for fever.  Cardiovascular: Positive for leg swelling.  Gastrointestinal: Negative.   Musculoskeletal: Positive for joint pain.  Neurological: Positive for sensory change and weakness.  Psychiatric/Behavioral: Negative.     As per HPI. Otherwise, a complete review of systems is negatve.  PAST MEDICAL HISTORY: Past Medical History  Diagnosis Date  . Herpes zoster   . Multiple sclerosis ([place]) 2001    Walks from room to room @ home; but Wheelchair when going out.  . Closed head injury with brief loss of consciousness ([place])   . Seizures ([place])     Takes Keppra  . IBS (irritable bowel syndrome)     no longer  . Bacterial endocarditis     History of .  Marland Kitchen Aortic valve disease     Mild AS / AI (cannot exclude Bicuspid AoV)  . Syncope and collapse   . Bilateral lower extremity edema     Noncardiac.  Chronic. LE Venous dopplers - negative for DVT.; Echocardiogram January 2016: Normal EF with normal wall motion and valve function. Only grade 1 diastolic dysfunction. EF  60-65%. Mild MR  . Neuromuscular disorder ([place])     MS  . Heart murmur   . Lymphedema     has legs wrapped at [facility]  . Cervical stenosis of spine   . Cancer [facility]) 12-31-13    Right breast, 12:00, 1.5 cm, T1c,N0 invasive mammary carcinoma, triple negative.  . Breast cancer ([place])   . MS (multiple sclerosis) ([place])   . Irritable bowel     PAST SURGICAL HISTORY: Past Surgical History  Procedure Laterality Date  . Cholecystectomy    . Ankle surgery      Left  . Port-a-cath removal      right  . Anterior cervical decomp/discectomy fusion  11/17/2011    Procedure: ANTERIOR CERVICAL DECOMPRESSION/DISCECTOMY FUSION 2 LEVELS;  Surgeon: Erline Levine, MD;  Location: [place] NEURO ORS;  Service: Neurosurgery;  Laterality: N/A;  Cervical Five-Six Six-Seven Anterior cervical decompression/diskectomy/fusion  . Transthoracic echocardiogram  03/2013; 02/2014    a) Normal LV size and function with EF 60-65%.; Cannot exclude bicuspid aortic valve with mild AS and mild AI.; b) Normal EF with normal wall motion and valve function x Mild MR. G2 DD. EF 60-65%. Tricuspid AoV  . Lower extremity venous dopplers  Feb 27, 2013    No LE DVT  . Port a cath insertion Right 01/19/2010  . Colonoscopy  2014  . Upper gi endoscopy  2014  . Port-a-cath removal Right 09/03/2013    Procedure: REMOVAL PORT-A-CATH;  Surgeon: Conrad Bithlo, MD;  Location: [place];  Service: Vascular;  Laterality: Right;  .  Breast biopsy Right 12-31-13    invasive mammary  . Breast surgery Right 02/03/2014    Right simple mastectomy with sentinel node biopsy.  . Mastectomy    . Ankle surgery      FAMILY HISTORY Family History  Problem Relation Age of Onset  . Cancer Father     skin  . Heart disease Father   . Heart attack Father   . Breast cancer Maternal Aunt 60  . Breast cancer Maternal Grandmother 74       ADVANCED DIRECTIVES:    HEALTH MAINTENANCE: Social History  Substance Use Topics  . Smoking status: Never Smoker   .  Smokeless tobacco: Never Used  . Alcohol Use: No     Colonoscopy:  PAP:  Bone density:  Lipid panel:  Allergies  Allergen Reactions  . Fentanyl Nausea And Vomiting and Nausea Only    vomiting Was given in PACU x3 each time patient got sick vomiting Was given in PACU x3 each time patient got sick  . Sulfa Antibiotics Hives and Other (See Comments)    Light headed, over heated    Current Outpatient Prescriptions  Medication Sig Dispense Refill  . amantadine (SYMMETREL) 100 MG capsule TAKE 3 CAPSULES BY MOUTH EVERY DAY 270 capsule 2  . baclofen (LIORESAL) 10 MG tablet TAKE 2 TABLETS BY MOUTH IN THE MORNING, 1 TABLET AT LUNCH, AND 2 TABLETS AT NIGHT AS DIRECTED 150 tablet 6  . Cholecalciferol (VITAMIN D3) 2000 UNITS capsule Take 2,000 Units by mouth daily.    . Cranberry 1000 MG CAPS Take by mouth daily.    . fentaNYL (DURAGESIC - DOSED MCG/HR) 50 MCG/HR Place 1 patch (50 mcg total) onto the skin every 3 (three) days. 10 patch 0  . GLUCOSAMINE-CHONDROITIN PO Take 1 tablet by mouth 3 (three) times daily. STRENGTH: 1500-1200    . ibuprofen (ADVIL,MOTRIN) 600 MG tablet TAKE 1 TABLET BY MOUTH EVERY 6 HOURS AS NEEDED 90 tablet 2  . interferon beta-1a (REBIF) 44 MCG/0.5ML SOSY injection Inject 44 mcg into the skin 3 (three) times a week.    . levETIRAcetam (KEPPRA) 500 MG tablet TAKE 1 TABLET BY MOUTH 2 TIMES DAILY 60 tablet 3  . lidocaine-prilocaine (EMLA) cream Apply 1 application topically once. Apply to port then cover with saran wrap 1-2 hours before chemotherapy    . loratadine (CLARITIN) 10 MG tablet Take 10 mg by mouth daily.    . Misc Natural Products (LEG VEIN & CIRCULATION) TABS Take by mouth daily.    . Multiple Vitamins-Minerals (MULTIVITAMIN PO) Take 1 tablet by mouth daily.    Marland Kitchen oxybutynin (DITROPAN-XL) 10 MG 24 hr tablet Take 2 tablets (20 mg total) by mouth daily. 180 tablet 3  . Oxycodone HCl 10 MG TABS Take 1 tablet (10 mg total) by mouth every 6 (six) hours as needed.  120 tablet 0  . prochlorperazine (COMPAZINE) 10 MG tablet Take 10 mg by mouth every 6 (six) hours as needed.      No current facility-administered medications for this visit.    OBJECTIVE: There were no vitals filed for this visit.   There is no weight on file to calculate BMI.    ECOG FS:1 - Symptomatic but completely ambulatory  General: Well-developed, well-nourished, no acute distress. Sitting in a wheelchair. Eyes: anicteric sclera. Breasts: Patient requested exam be deferred today. Lungs: Clear to auscultation bilaterally. Heart: Regular rate and rhythm. No rubs, murmurs, or gallops. Abdomen: Soft, nontender, nondistended. No organomegaly noted, normoactive bowel  sounds. Musculoskeletal: Bilateral lower extremity lymphedema. Neuro: Alert, answering all questions appropriately. Cranial nerves grossly intact. Skin: No rashes or petechiae noted. Psych: Normal affect.   LAB RESULTS:  Lab Results  Component Value Date   NA 136 07/17/2014   K 3.6 07/17/2014   CL 104 07/17/2014   CO2 28 07/17/2014   GLUCOSE 93 07/17/2014   BUN 21* 07/17/2014   CREATININE 0.46 07/17/2014   CALCIUM 9.1 07/17/2014   PROT 7.4 07/17/2014   ALBUMIN 4.5 07/17/2014   AST 19 07/17/2014   ALT 16 07/17/2014   ALKPHOS 73 07/17/2014   BILITOT 0.5 07/17/2014   GFRNONAA >60 07/17/2014   GFRAA >60 07/17/2014    Lab Results  Component Value Date   WBC 3.3* 07/17/2014   NEUTROABS 2.0 07/17/2014   HGB 11.5* 07/17/2014   HCT 35.0 07/17/2014   MCV 92.7 07/17/2014   PLT 130* 07/17/2014     STUDIES: No results found.  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 BRCA negative.  PLAN:    1. Breast cancer: No evidence of disease. CA-27-29 continues to be within normal limits. Because patient had a full mastectomy, she did not require adjuvant XRT. Given the fact that she is a triple negative cancer, she will not require an aromatase inhibitor. Given her difficulties with  Taxol and multiple sclerosis, treatment was discontinued after a total of 6 of 12 cycles of Taxol. She will require a mammogram of her left breast in the next 2-3 weeks. Return to clinic in 3 months for laboratory work and further evaluation.  2. Multiple sclerosis: Patient has reinitiated treatments. 3. Lymphedema: Continue treatment and wraps as prescribed.  4. Peripheral neuropathy: Monitor. 5. Pain: Continue fentanyl patch to 50 g and 10 mg oxycodone as needed for breakthrough pain.    Her primary neurologist is Dr. Andrey Spearman.  phone [phone], fax [phone].   Patient expressed understanding and was in agreement with this plan. She also understands that She can call clinic at any time with any questions, concerns, or complaints.   Breast cancer   Staging form: Breast, AJCC 7th Edition     Pathologic stage from 05/24/2014: Stage IA (T1c, N0, cM0) - Signed by Lloyd Huger, MD on 05/24/2014   Lloyd Huger, MD   02/06/2015 9:08 AM

## 2015-02-10 ENCOUNTER — Telehealth: Payer: Self-pay | Admitting: *Deleted

## 2015-02-10 MED ORDER — PROCHLORPERAZINE MALEATE 10 MG PO TABS: 10.0000 mg | ORAL_TABLET | Freq: Four times a day (QID) | ORAL | Status: DC | PRN

## 2015-02-10 NOTE — Telephone Encounter (Signed)
Escribed

## 2015-02-11 ENCOUNTER — Other Ambulatory Visit: Payer: Self-pay | Admitting: *Deleted

## 2015-02-11 ENCOUNTER — Telehealth: Payer: Self-pay | Admitting: *Deleted

## 2015-02-11 MED ORDER — FENTANYL 50 MCG/HR TD PT72: 50.0000 ug | MEDICATED_PATCH | TRANSDERMAL | Status: DC

## 2015-02-11 NOTE — Telephone Encounter (Signed)
Needs Fentanyl Patch RF.

## 2015-02-11 NOTE — Telephone Encounter (Signed)
RX printed, will call patient for pick up tomorrow am.

## 2015-02-19 ENCOUNTER — Inpatient Hospital Stay: Payer: Medicare Other

## 2015-02-26 ENCOUNTER — Inpatient Hospital Stay: Payer: Medicare Other | Attending: Oncology

## 2015-02-26 ENCOUNTER — Inpatient Hospital Stay: Payer: Medicare Other

## 2015-02-26 ENCOUNTER — Other Ambulatory Visit: Payer: Self-pay | Admitting: Oncology

## 2015-02-26 DIAGNOSIS — C50911 Malignant neoplasm of unspecified site of right female breast: Secondary | ICD-10-CM

## 2015-02-26 DIAGNOSIS — Z171 Estrogen receptor negative status [ER-]: Secondary | ICD-10-CM | POA: Insufficient documentation

## 2015-02-26 DIAGNOSIS — Z452 Encounter for adjustment and management of vascular access device: Secondary | ICD-10-CM | POA: Diagnosis not present

## 2015-02-26 DIAGNOSIS — Z853 Personal history of malignant neoplasm of breast: Secondary | ICD-10-CM | POA: Diagnosis not present

## 2015-02-26 DIAGNOSIS — C801 Malignant (primary) neoplasm, unspecified: Secondary | ICD-10-CM

## 2015-02-26 MED ORDER — SODIUM CHLORIDE 0.9 % IJ SOLN
10.0000 mL | INTRAMUSCULAR | Status: DC | PRN
  Administered 2015-02-26: 10 mL via INTRAVENOUS
  Filled 2015-02-26: qty 10

## 2015-02-26 MED ORDER — HEPARIN SOD (PORK) LOCK FLUSH 100 UNIT/ML IV SOLN
INTRAVENOUS | Status: AC
  Filled 2015-02-26: qty 5

## 2015-02-26 MED ORDER — HEPARIN SOD (PORK) LOCK FLUSH 100 UNIT/ML IV SOLN
500.0000 [IU] | Freq: Once | INTRAVENOUS | Status: AC
  Administered 2015-02-26: 500 [IU] via INTRAVENOUS

## 2015-02-26 NOTE — Progress Notes (Signed)
Survivorship Care Plan visit completed.  Treatment summary reviewed and given to patient.  ASCO answers booklet reviewed and given to patient.  CARE program and Cancer Transitions discussed along with other resources provided by cancer center.  Patient verbalized understanding.

## 2015-02-27 ENCOUNTER — Other Ambulatory Visit: Payer: Self-pay | Admitting: Diagnostic Neuroimaging

## 2015-03-04 ENCOUNTER — Telehealth: Payer: Self-pay | Admitting: *Deleted

## 2015-03-04 NOTE — Telephone Encounter (Signed)
I called pt and explained that it is too early to refill her rx and she stated she put new patch on today and has 2 more left I asked that she call back on Monday for her refill, she said she would

## 2015-03-09 ENCOUNTER — Telehealth: Payer: Self-pay | Admitting: *Deleted

## 2015-03-09 DIAGNOSIS — C50919 Malignant neoplasm of unspecified site of unspecified female breast: Secondary | ICD-10-CM

## 2015-03-09 MED ORDER — FENTANYL 50 MCG/HR TD PT72: 50.0000 ug | MEDICATED_PATCH | TRANSDERMAL | Status: DC

## 2015-03-09 MED ORDER — OXYCODONE HCL 10 MG PO TABS: 10.0000 mg | ORAL_TABLET | Freq: Four times a day (QID) | ORAL | Status: DC | PRN

## 2015-03-09 NOTE — Telephone Encounter (Signed)
Informed that prescription is ready to pick up  

## 2015-03-11 ENCOUNTER — Other Ambulatory Visit: Payer: Self-pay | Admitting: Oncology

## 2015-03-11 ENCOUNTER — Ambulatory Visit
Admission: RE | Admit: 2015-03-11 | Discharge: 2015-03-11 | Disposition: A | Discharge status: Home / Self Care | Payer: Medicare Other | Source: Ambulatory Visit | Attending: Oncology | Admitting: Oncology

## 2015-03-11 DIAGNOSIS — Z1231 Encounter for screening mammogram for malignant neoplasm of breast: Secondary | ICD-10-CM | POA: Diagnosis not present

## 2015-03-11 DIAGNOSIS — R262 Difficulty in walking, not elsewhere classified: Secondary | ICD-10-CM | POA: Diagnosis not present

## 2015-03-11 DIAGNOSIS — G35 Multiple sclerosis: Secondary | ICD-10-CM | POA: Diagnosis not present

## 2015-03-11 DIAGNOSIS — C50911 Malignant neoplasm of unspecified site of right female breast: Secondary | ICD-10-CM

## 2015-03-16 ENCOUNTER — Other Ambulatory Visit: Payer: Self-pay | Admitting: *Deleted

## 2015-03-16 MED ORDER — PROCHLORPERAZINE MALEATE 10 MG PO TABS: 10.0000 mg | ORAL_TABLET | Freq: Four times a day (QID) | ORAL | Status: DC | PRN

## 2015-03-23 ENCOUNTER — Ambulatory Visit (INDEPENDENT_AMBULATORY_CARE_PROVIDER_SITE_OTHER): Payer: Medicare Other | Admitting: Diagnostic Neuroimaging

## 2015-03-23 ENCOUNTER — Encounter: Payer: Self-pay | Admitting: Diagnostic Neuroimaging

## 2015-03-23 VITALS — BP 132/80 | HR 71

## 2015-03-23 DIAGNOSIS — G35 Multiple sclerosis: Secondary | ICD-10-CM

## 2015-03-23 MED ORDER — BACLOFEN 10 MG PO TABS: 10.0000 mg | ORAL_TABLET | Freq: Three times a day (TID) | ORAL | Status: DC

## 2015-03-23 MED ORDER — LEVETIRACETAM 500 MG PO TABS: 500.0000 mg | ORAL_TABLET | Freq: Two times a day (BID) | ORAL | Status: DC

## 2015-03-23 NOTE — Progress Notes (Signed)
[facility]  PATIENT: Rebecca [name] DOB: Feb 25, 1960  REFERRING CLINICIAN:  HISTORY FROM: patient, husband, aunt REASON FOR VISIT: follow up   HISTORICAL  CHIEF COMPLAINT:  Chief Complaint  Patient presents with  . Multiple sclerosis    rm 7, husband [name], aunt-Erma, 09/2014 MRI, "no new problems, want to discuss physical therapy"  . Follow-up    6 month    HISTORY OF PRESENT ILLNESS:   UPDATE 03/23/15: Since last visit, doing well. Back on rebif. Ready to try PT evaluation again. Tolerating meds.   UPDATE 09/16/14: Since last visit, completed breast CA treatments (adriamycin, cytoxan, taxol; couldn't complete the taxol cycles). Now ready for restarting MS treatments. No focal flares of MS, but had generalized slowness, fatigue, muscle spasms.   UPDATE 02/13/14: Since last visit, has been diagnosed with triple negative breast CA, s/p right mastectomy. Now planning to have chemotherapy. No new neuro symptoms. Tolerating rebif.   UPDATE 11/13/13: Since last visit, doing well. Getting wrap / lymphedema therapy via Amy (OT) at [facility] with great results. Significant improvement. Tolerating rebif. No new lesions or symptoms.  UPDATE 08/14/13: Since last visit, has continued on rebif. BLE edema still a problem. Saw cardiology, tried diuretic, and also trying wrap/massage therapy next week. Walking and mobility gradually worsening.   UPDATE 02/13/13 (LL): The patient returns for followup visit. Since last visit she states that she has seen Dr. Katy Fitch and that she needs new prescription eyeglasses for vision problems that the double vision resolved. She is tolerating rebif. Her biggest concern is that her bilateral leg edema is making it difficult for her to move her legs or ambulate. She is not consistently wearing her TED hose. She denies any shortness of breath, but is mostly wheelchair-bound. She states that she feels like she is progressively getting weaker. She asked for  information about Tecfidera. She said she had a cardiology consultation in the past when she started Gilenya, but that Dr. has since retired. She states she was diagnosed with aortic insufficiency. Her urgency and frequency of urination is getting worse, she asked if she can be increased on her medication.   UPDATE 11/07/12: Since last visit, doing well, until 1 month ago and developed fluctuating double vision/blurred vision. Affects right greater than left eye. It is present even if she closes the right or left eyes.   UPDATE 04/20/12: Since last visit, has come off gabapentin. BLE swelling stable. S/p ACDF surg per Dr. Vertell Limber and doing well. Tolerating rebif. Has new PCP and also getting PT. Few days ago, developed some numbness in right hand, similar to pre-op symptoms which improved post-op. No neck pain.   UPDATE 01/10/12: Noticed worsening swelling to bilateral lower extremities since 01/04/12. She is also having throbbing pain to shins. Difficulty with wearing sneakers. Unable to use leg compression machine. Denies shortness of breath.   UPDATE 10/27/11: Having surgery on c-spine fusion on November 17, 2011. Concerned about recovery time and her ability to perform ADL's. Lives with aunt. Wanting to know if she needs to have someone come to home or stay at rehab. Intermittent blurred vision las 3 weeks.   UPDATE 07/27/11: Doing better with shingles and post-herpetic neuralgia pain; on gralise. Now with worsening RLE weakness, fatigue, vision changes, speech diff. Discussed tx options.   UPDATE 06/02/11: On 05/27/11 developed right lower tooth pain; 4/20 developed blisters and rash on right chin and jaw. 4/22 went to dentist, diagnosed with tooth infx and given amoxicillin. Rash  more significant. 06/01/11, went to dermatologist and dx'd possible shingles. Given valtrex and started on 4/24. Lesions still oozing. Has right ear and right facial pain, severe. No eye pain.   UPDATE 03/02/10: Patient was getting  worked up for first dose observation for Gilenya, however on 02/28/2011, patient had a suspected seizure. She was in the bathroom at home, stood up and without warning have loss of consciousness. Her mother found her on the floor groaning with some twitching movements of her upper extremities, and her legs outstretched in front of her. No incontinence or tongue biting. It took approximately 20-30 minutes for her to wake up. She was amnestic events until the next day.  In retrospect, patient had 4 or 5 episodes of syncope versus seizure in the early 1980s. She was never on antiseizure medication. Patient was also on Ampyra since past 2 years.   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 Last dose tysabri on 10/29/10. She would like to switch to [place]. No MS flares. Some increased fatigue. Also asking if amantadine can be stopped to see if peripheral edema improves.   PRIOR HPI (11/02/10): 55 year old right-handed female with history of multiple sclerosis. Here for transfer of care from [place]. 2001 - patient developed left leg numbness and weakness, followed by right facial weakness and slurred speech. She was evaluated with MRI of the brain, diagnosed with multiple sclerosis and started on Copaxone. She did not have lumbar puncture or spinal cord MRI. 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 She thinks she was off of Copaxone for approximately 2 years. 2008-9 - patient was evaluated at [facility] and started on Tysabri. She thinks she has been on Tysabri for approximately 3-1/2 years. Last dose was last week at [facility]. She continues to decline in terms of mobility. She is to walk using a cane, then started using a walker, now uses a transport chair occasionally. Patient continues to have intermittent right facial twitching and numbness. She is on amantadine, baclofen, Amprya, and oxybutynin for MS symptoms.    REVIEW OF SYSTEMS: Full 14 system  review of systems performed and notable only for leg swelling murmur fatigue.   ALLERGIES: Allergies  Allergen Reactions  . Fentanyl Nausea And Vomiting and Nausea Only    vomiting Was given in PACU x3 each time patient got sick vomiting Was given in PACU x3 each time patient got sick  . Sulfa Antibiotics Hives and Other (See Comments)    Light headed, over heated    HOME MEDICATIONS: Outpatient Prescriptions Prior to Visit  Medication Sig Dispense Refill  . amantadine (SYMMETREL) 100 MG capsule TAKE 3 CAPSULES BY MOUTH EVERY DAY 270 capsule 0  . baclofen (LIORESAL) 10 MG tablet TAKE 2 TABLETS BY MOUTH IN THE MORNING, 1 TABLET AT LUNCH, AND 2 TABLETS AT NIGHT AS DIRECTED 150 tablet 6  . Cholecalciferol (VITAMIN D3) 2000 UNITS capsule Take 2,000 Units by mouth daily.    . Cranberry 1000 MG CAPS Take by mouth daily.    . fentaNYL (DURAGESIC - DOSED MCG/HR) 50 MCG/HR Place 1 patch (50 mcg total) onto the skin every 3 (three) days. 10 patch 0  . GLUCOSAMINE-CHONDROITIN PO Take 1 tablet by mouth 3 (three) times daily. STRENGTH: 1500-1200    . ibuprofen (ADVIL,MOTRIN) 600 MG tablet TAKE 1 TABLET BY MOUTH EVERY 6 HOURS AS NEEDED 90 tablet 2  . interferon beta-1a (REBIF) 44 MCG/0.5ML SOSY injection Inject 44 mcg into  the skin 3 (three) times a week.    . levETIRAcetam (KEPPRA) 500 MG tablet TAKE 1 TABLET BY MOUTH 2 TIMES DAILY 60 tablet 3  . lidocaine-prilocaine (EMLA) cream Apply 1 application topically once. Apply to port then cover with saran wrap 1-2 hours before chemotherapy    . loratadine (CLARITIN) 10 MG tablet Take 10 mg by mouth daily.    . Misc Natural Products (LEG VEIN & CIRCULATION) TABS Take by mouth daily.    . Multiple Vitamins-Minerals (MULTIVITAMIN PO) Take 1 tablet by mouth daily.    Marland Kitchen oxybutynin (DITROPAN-XL) 10 MG 24 hr tablet TAKE 2 TABLETS BY MOUTH DAILY 180 tablet 0  . Oxycodone HCl 10 MG TABS Take 1 tablet (10 mg total) by mouth every 6 (six) hours as needed. 120  tablet 0  . prochlorperazine (COMPAZINE) 10 MG tablet Take 1 tablet (10 mg total) by mouth every 6 (six) hours as needed. 30 tablet 1  . oxybutynin (DITROPAN-XL) 10 MG 24 hr tablet Take 2 tablets (20 mg total) by mouth daily. 180 tablet 3   No facility-administered medications prior to visit.    PAST MEDICAL HISTORY: Past Medical History  Diagnosis Date  . Herpes zoster   . Multiple sclerosis ([place]) 2001    Walks from room to room @ home; but Wheelchair when going out.  . Closed head injury with brief loss of consciousness ([place])   . Seizures ([place])     Takes Keppra  . IBS (irritable bowel syndrome)     no longer  . Bacterial endocarditis     History of .  Marland Kitchen Aortic valve disease     Mild AS / AI (cannot exclude Bicuspid AoV)  . Syncope and collapse   . Bilateral lower extremity edema     Noncardiac.  Chronic. LE Venous dopplers - negative for DVT.; Echocardiogram January 2016: Normal EF with normal wall motion and valve function. Only grade 1 diastolic dysfunction. EF 60-65%. Mild MR  . Neuromuscular disorder ([place])     MS  . Heart murmur   . Lymphedema     has legs wrapped at [facility]  . Cervical stenosis of spine   . MS (multiple sclerosis) ([place])   . Irritable bowel   . Cancer [facility]) 12-31-13    Right breast, 12:00, 1.5 cm, T1c,N0 invasive mammary carcinoma, triple negative.  . Breast cancer ([place])     chemo    PAST SURGICAL HISTORY: Past Surgical History  Procedure Laterality Date  . Cholecystectomy    . Ankle surgery      Left  . Port-a-cath removal      right  . Anterior cervical decomp/discectomy fusion  11/17/2011    Procedure: ANTERIOR CERVICAL DECOMPRESSION/DISCECTOMY FUSION 2 LEVELS;  Surgeon: Erline Levine, MD;  Location: [place] [facility];  Service: Neurosurgery;  Laterality: N/A;  Cervical Five-Six Six-Seven Anterior cervical decompression/diskectomy/fusion  . Transthoracic echocardiogram  03/2013; 02/2014    a) Normal LV size and function with EF 60-65%.; Cannot exclude  bicuspid aortic valve with mild AS and mild AI.; b) Normal EF with normal wall motion and valve function x Mild MR. G2 DD. EF 60-65%. Tricuspid AoV  . Lower extremity venous dopplers  Feb 27, 2013    No LE DVT  . Port a cath insertion Right 01/19/2010  . Colonoscopy  2014  . Upper gi endoscopy  2014  . Port-a-cath removal Right 09/03/2013    Procedure: REMOVAL PORT-A-CATH;  Surgeon: Conrad , MD;  Location: [facility]  OR;  Service: Vascular;  Laterality: Right;  . Breast biopsy Right 12-31-13    invasive mammary  . Breast surgery Right 02/03/2014    Right simple mastectomy with sentinel node biopsy.  . Mastectomy    . Ankle surgery      FAMILY HISTORY: Family History  Problem Relation Age of Onset  . Cancer Father     skin  . Heart disease Father   . Heart attack Father   . Breast cancer Maternal Aunt 60  . Breast cancer Maternal Grandmother 74    SOCIAL HISTORY:  Social History   Social History  . Marital Status: Married    Spouse Name: don  . Number of Children: 0  . Years of Education: 12   Occupational History  . disability    Social History Main Topics  . Smoking status: Never Smoker   . Smokeless tobacco: Never Used  . Alcohol Use: No  . Drug Use: No  . Sexual Activity:    Partners: Male   Other Topics Concern  . Not on file   Social History Narrative   She is married. Recently moved back to [place] after being in [place] for some time. She is accompanied by her husband and aunt.   Never smoked. Never used alcohol.     PHYSICAL EXAM  Filed Vitals:   03/23/15 1037  BP: 132/80  Pulse: 71    Not recorded      There is no weight on file to calculate BMI.  GENERAL EXAM:  General: Patient is awake, alert and in no acute distress. Well developed and groomed.  Neck: Neck is supple.  Cardiovascular: Heart is regular rate and rhythm with mild systolic murmur. PERIPHERAL EDEMA IN BLE.   Neurologic Exam  Mental Status: Awake, alert. Language  is fluent and comprehension intact. Cranial Nerves: Pupils are equal and reactive to light. Visual fields are full to confrontation. Conjugate eye movements are full and symmetric. Facial sensation and strength are symmetric. Hearing is intact. Palate elevated symmetrically and uvula is midline. Shoulder shrug is symmetric. Tongue is midline.  Motor: Normal bulk and tone. Full strength in the upper extremities; BLE (1-2/5 prox, 3/5 distal), INCREASED TONE IN BLE. No pronator drift.  Sensory: Intact and symmetric to light touch.  Coordination: FTN SMOOTH Reflexes: Deep tendon reflexes in the upper and lower extremity are present and symmetric; TRACE AT KNEES AND ANKLES. PERIPHERAL EDEMA. Gait and Station: [place].     DIAGNOSTIC DATA (LABS, IMAGING, TESTING) - I reviewed patient records, labs, notes, testing and imaging myself where available.  Lab Results  Component Value Date   WBC 3.3* 07/17/2014   HGB 11.5* 07/17/2014   HCT 35.0 07/17/2014   MCV 92.7 07/17/2014   PLT 130* 07/17/2014      Component Value Date/Time   NA 136 07/17/2014 0941   NA 139 06/05/2014 0951   NA 141 01/09/2014 0930   K 3.6 07/17/2014 0941   K 3.8 06/05/2014 0951   CL 104 07/17/2014 0941   CL 107 06/05/2014 0951   CO2 28 07/17/2014 0941   CO2 28 06/05/2014 0951   GLUCOSE 93 07/17/2014 0941   GLUCOSE 130* 06/05/2014 0951   GLUCOSE 86 01/09/2014 0930   BUN 21* 07/17/2014 0941   BUN 12 06/05/2014 0951   BUN 20 01/09/2014 0930   CREATININE 0.46 07/17/2014 0941   CREATININE 0.56 06/05/2014 0951   CALCIUM 9.1 07/17/2014 0941   CALCIUM 9.4 06/05/2014 0951  PROT 7.4 07/17/2014 0941   PROT 7.0 05/29/2014 0949   PROT 6.9 01/09/2014 0930   ALBUMIN 4.5 07/17/2014 0941   ALBUMIN 4.1 05/29/2014 0949   ALBUMIN 4.3 01/09/2014 0930   AST 19 07/17/2014 0941   AST 24 05/29/2014 0949   ALT 16 07/17/2014 0941   ALT 24 05/29/2014 0949   ALKPHOS 73 07/17/2014 0941   ALKPHOS 58 05/29/2014 0949   BILITOT  0.5 07/17/2014 0941   BILITOT 0.6 05/29/2014 0949   GFRNONAA >60 07/17/2014 0941   GFRNONAA >60 06/05/2014 0951   GFRNONAA >60 02/28/2011 1331   GFRAA >60 07/17/2014 0941   GFRAA >60 06/05/2014 0951   GFRAA >60 02/28/2011 1331   No results found for: CHOL No results found for: HGBA1C No results found for: VITAMINB12 No results found for: TSH  I reviewed images myself and agree with interpretation. -VRP  08/21/13 MRI brain (with and without) demonstrating:  1. Multiple periventricular and subcortical chronic demyelinating plaques. Some of these are confluent. Some are hypointense on T1.  2. No acute plaques.  3. No significant change from MRI on 11/20/12.   08/21/13 MRI cervical spine (with and without) demonstrating:  1. At C2-3: disc bulging and facet hypertrophy with mild spinal stenosis and mild left foraminal stenosis.  2. At C5-6: uncovertebral joint hypertrophy with mild right foraminal stenosis.  3. Multiple chronic demyelinating plaques from C2 to T1. No abnormal enhancing lesions.  4. Compared to MRI from 10/04/11, there has been progression of degenerative spine disease at C2-3. Also there has been interval ACDF from C5-C7. No significant change in spinal cord lesions.  08/21/13 MRI thoracic spine (with and without) demonstrating:  1. Multiple spinal cord chronic demyelinating plaques from C7 to T12-L1.  2. No acute plaques.  3. ACDF at C5-C7.  4. Compared to prior MRI from 08/03/11, the ACDF is new. Overall no new demyelinating plaques.  09/30/14 MRI brain  - showing 2/flare foci in the right cervical hemisphere, pons, thalami and cerebral hemispheres in a pattern and configuration consistent with the diagnosis of multiple sclerosis. None of the foci enhances after contrast administration. There are no acute findings. When compared to the MRI dated 08/21/2013, there has been no interval change.   ASSESSMENT AND PLAN  55 y.o. year old female here with multiple sclerosis,  initially on copaxone, then was on Tysabri in the past (3-1/2 years); then became JCV +. Last dose tysabri 10/29/10. Switched to [place] on 04/25/11. Had brief seizure before starting gilenya, now on levetiracetam. Then with right V2, V3 herpes zoster on 05/27/11. Gilenya stopped. Shingles resolved.  Then on rebif. Also s/p ACDF C5-6 (Oct 2013) for spinal stenosis. Now with breast CA diagnosis, s/p right mastectomy, s/p adriamycin, cytoxan, taxol. Now treatments have been completed. Now back on rebif.  Dx:  MS (multiple sclerosis) ([place]) - Plan: Ambulatory referral to Physical Therapy    PLAN:  1. Continue rebif 3. Continue baclofen for muscle spasms (stable) 4. Continue levetiracetam for seizure d/o (stable) 4. PT evaluation  Orders Placed This Encounter  Procedures  . Ambulatory referral to Physical Therapy   Meds ordered this encounter  Medications  . levETIRAcetam (KEPPRA) 500 MG tablet    Sig: Take 1 tablet (500 mg total) by mouth 2 (two) times daily.    Dispense:  180 tablet    Refill:  4  . baclofen (LIORESAL) 10 MG tablet    Sig: Take 1-2 tablets (10-20 mg total) by mouth 3 (three) times daily.  Dispense:  150 tablet    Refill:  12   Return in about 6 months (around 09/20/2015).    Penni Bombard, MD [id number], [id number] AM Certified in Neurology, Neurophysiology and Neuroimaging  [facility] [address], [place], [place] [address] [phone]

## 2015-03-30 ENCOUNTER — Telehealth: Payer: Self-pay | Admitting: Family Medicine

## 2015-03-30 ENCOUNTER — Ambulatory Visit: Payer: Medicare Other | Admitting: Family Medicine

## 2015-03-30 NOTE — Telephone Encounter (Signed)
Patient notified and agreed she would have specialist do for her.

## 2015-03-30 NOTE — Telephone Encounter (Signed)
Rebecca [name] please ask patient why she is following up with me on 04/06/15 to request a lift chair when her Multiple Sclerosis is management by specialists. As I am leaving the practice soon I will not be able to help with securing any mobility chairs or lift chairs at this time. Please ask her to discuss this with her specialists who know and actively manage her conditions.

## 2015-04-02 DIAGNOSIS — R262 Difficulty in walking, not elsewhere classified: Secondary | ICD-10-CM | POA: Diagnosis not present

## 2015-04-02 DIAGNOSIS — G35 Multiple sclerosis: Secondary | ICD-10-CM | POA: Diagnosis not present

## 2015-04-06 ENCOUNTER — Ambulatory Visit: Payer: Medicare Other | Admitting: Family Medicine

## 2015-04-06 DIAGNOSIS — R262 Difficulty in walking, not elsewhere classified: Secondary | ICD-10-CM | POA: Diagnosis not present

## 2015-04-06 DIAGNOSIS — G35 Multiple sclerosis: Secondary | ICD-10-CM | POA: Diagnosis not present

## 2015-04-08 DIAGNOSIS — R262 Difficulty in walking, not elsewhere classified: Secondary | ICD-10-CM | POA: Diagnosis not present

## 2015-04-08 DIAGNOSIS — G35 Multiple sclerosis: Secondary | ICD-10-CM | POA: Diagnosis not present

## 2015-04-14 ENCOUNTER — Other Ambulatory Visit: Payer: Self-pay | Admitting: Family Medicine

## 2015-04-16 ENCOUNTER — Inpatient Hospital Stay: Payer: Medicare Other | Attending: Oncology

## 2015-04-16 ENCOUNTER — Inpatient Hospital Stay: Payer: Medicare Other

## 2015-04-16 ENCOUNTER — Inpatient Hospital Stay (HOSPITAL_BASED_OUTPATIENT_CLINIC_OR_DEPARTMENT_OTHER): Payer: Medicare Other | Admitting: Oncology

## 2015-04-16 VITALS — BP 154/83 | HR 73 | Temp 97.3°F | Resp 16

## 2015-04-16 DIAGNOSIS — Z9221 Personal history of antineoplastic chemotherapy: Secondary | ICD-10-CM | POA: Insufficient documentation

## 2015-04-16 DIAGNOSIS — Z9011 Acquired absence of right breast and nipple: Secondary | ICD-10-CM | POA: Diagnosis not present

## 2015-04-16 DIAGNOSIS — I972 Postmastectomy lymphedema syndrome: Secondary | ICD-10-CM | POA: Insufficient documentation

## 2015-04-16 DIAGNOSIS — Z171 Estrogen receptor negative status [ER-]: Secondary | ICD-10-CM | POA: Diagnosis not present

## 2015-04-16 DIAGNOSIS — G629 Polyneuropathy, unspecified: Secondary | ICD-10-CM | POA: Diagnosis not present

## 2015-04-16 DIAGNOSIS — G35 Multiple sclerosis: Secondary | ICD-10-CM | POA: Insufficient documentation

## 2015-04-16 DIAGNOSIS — C801 Malignant (primary) neoplasm, unspecified: Secondary | ICD-10-CM

## 2015-04-16 DIAGNOSIS — Z79899 Other long term (current) drug therapy: Secondary | ICD-10-CM

## 2015-04-16 DIAGNOSIS — C50919 Malignant neoplasm of unspecified site of unspecified female breast: Secondary | ICD-10-CM

## 2015-04-16 DIAGNOSIS — C50911 Malignant neoplasm of unspecified site of right female breast: Secondary | ICD-10-CM | POA: Diagnosis not present

## 2015-04-16 DIAGNOSIS — C50912 Malignant neoplasm of unspecified site of left female breast: Secondary | ICD-10-CM

## 2015-04-16 MED ORDER — FENTANYL 50 MCG/HR TD PT72: 50.0000 ug | MEDICATED_PATCH | TRANSDERMAL | Status: DC

## 2015-04-16 MED ORDER — HEPARIN SOD (PORK) LOCK FLUSH 100 UNIT/ML IV SOLN
500.0000 [IU] | Freq: Once | INTRAVENOUS | Status: AC
  Administered 2015-04-16: 500 [IU] via INTRAVENOUS

## 2015-04-16 MED ORDER — OXYCODONE HCL 10 MG PO TABS: 10.0000 mg | ORAL_TABLET | Freq: Four times a day (QID) | ORAL | Status: DC | PRN

## 2015-04-16 MED ORDER — SODIUM CHLORIDE 0.9% FLUSH
10.0000 mL | INTRAVENOUS | Status: DC | PRN
  Administered 2015-04-16: 10 mL via INTRAVENOUS
  Filled 2015-04-16: qty 10

## 2015-04-16 MED ORDER — HEPARIN SOD (PORK) LOCK FLUSH 100 UNIT/ML IV SOLN
INTRAVENOUS | Status: AC
  Filled 2015-04-16: qty 5

## 2015-04-16 NOTE — Progress Notes (Signed)
Patient does not offer any problems today regarding her breast cancer.  She is still needing the Fentanyl Patch and Oxycodone for her let pain/spasms.

## 2015-04-16 NOTE — Progress Notes (Signed)
[place]  [phone] [phone]  ID: [name] OB: Dec 03, 1960  MR#: [id number]  QQI#:[id number]  Patient Care Team: Bobetta Lime, MD as PCP - General Bobetta Lime, MD as Referring Physician [name], MD (General Surgery)  CHIEF COMPLAINT:  Chief Complaint  Patient presents with  . Breast Cancer    INTERVAL HISTORY: Patient returns to clinic today for routine 3 month evaluation. Her pain is effectively better controlled on her current narcotic regimen. Her peripheral neuropathy is improved. Her lower extremity edema is also significantly improved. She does not report headache today. She denies any chest pain or shortness of breath. She denies any nausea, vomiting, constipation, or diarrhea. She has no urinary complaints. Patient offers no further specific complaints.   REVIEW OF SYSTEMS:   Review of Systems  Constitutional: Negative for fever and malaise/fatigue.  Cardiovascular: Positive for leg swelling.  Gastrointestinal: Negative.   Musculoskeletal: Positive for joint pain.  Neurological: Positive for sensory change. Negative for weakness.  Psychiatric/Behavioral: Negative.     As per HPI. Otherwise, a complete review of systems is negatve.  PAST MEDICAL HISTORY: Past Medical History  Diagnosis Date  . Herpes zoster   . Multiple sclerosis ([place]) 2001    Walks from room to room @ home; but Wheelchair when going out.  . Closed head injury with brief loss of consciousness ([place])   . Seizures ([place])     Takes Keppra  . IBS (irritable bowel syndrome)     no longer  . Bacterial endocarditis     History of .  Marland Kitchen Aortic valve disease     Mild AS / AI (cannot exclude Bicuspid AoV)  . Syncope and collapse   . Bilateral lower extremity edema     Noncardiac.  Chronic. LE Venous dopplers - negative for DVT.; Echocardiogram January 2016: Normal EF with normal wall motion and valve function. Only grade 1 diastolic  dysfunction. EF 60-65%. Mild MR  . Neuromuscular disorder ([place])     MS  . Heart murmur   . Lymphedema     [facility] legs [facility] at [facility]  . Cervical stenosis of spine   . MS (multiple sclerosis) ([place])   . Irritable bowel   . Cancer [facility]) 12-31-13    Right breast, 12:00, 1.5 cm, T1c,N0 invasive mammary carcinoma, triple negative.  . Breast cancer ([place])     chemo    PAST SURGICAL HISTORY: Past Surgical History  Procedure Laterality Date  . Cholecystectomy    . Ankle surgery      Left  . Port-a-cath removal      right  . Anterior cervical decomp/discectomy fusion  11/17/2011    Procedure: ANTERIOR CERVICAL DECOMPRESSION/DISCECTOMY FUSION 2 LEVELS;  Surgeon: Erline Levine, MD;  Location: [place] NEUR[facility] ORS;  Service: Neurosurgery;  Laterality: N/A;  Cervical Five-Six Six-Seven Anterior cervical decompression/diskectomy/fusion  . Transthoracic echocardiogram  03/2013; 02/2014    a) Normal LV size and function with EF 60-65%.; Cannot exclude bicuspid aortic valve with mild AS and mild AI.; b) Normal EF with normal wall motion and valve function x Mild MR. G2 DD. EF 60-65%. Tricuspid AoV  . Lower extremity venous dopplers  Feb 27, 2013    No LE DVT  . Port a cath insertion Right 01/19/2010  . Colonoscopy  2014  . Upper gi endoscopy  2014  . Port-a-cath removal Right 09/03/2013    Procedure: REMOVAL PORT-A-CATH;  Surgeon: Conrad Grand [place], MD;  Location: [place];  Service:  Vascular;  Laterality: Right;  . Breast biopsy Right 12-31-13    invasive mammary  . Breast surgery Right 02/03/2014    Right simple mastectomy with sentinel node biopsy.  . Mastectomy    . Ankle surgery      FAMILY HISTORY Family History  Problem Relation Age of Onset  . Cancer Father     skin  . Heart disease Father   . Heart attack Father   . Breast cancer Maternal Aunt 60  . Breast cancer Maternal Grandmother 49       ADVANCED DIRECTIVES:    HEALTH MAINTENANCE: Social History  Substance Use Topics  . Smoking  status: Never Smoker   . Smokeless tobacco: Never Used  . Alcohol Use: No     Colonoscopy:  PAP:  Bone density:  Lipid panel:  Allergies  Allergen Reactions  . Fentanyl Nausea And Vomiting and Nausea Only    vomiting Was given in PACU x3 each time patient got sick vomiting Was given in PACU x3 each time patient got sick  . Sulfa Antibiotics Hives and Other (See Comments)    Light headed, over heated    Current Outpatient Prescriptions  Medication Sig Dispense Refill  . amantadine (SYMMETREL) 100 MG capsule TAKE 3 CAPSULES BY MOUTH EVERY DAY 270 capsule 0  . baclofen (LIORESAL) 10 MG tablet Take 1-2 tablets (10-20 mg total) by mouth 3 (three) times daily. 150 tablet 12  . Cholecalciferol (VITAMIN D3) 2000 UNITS capsule Take 2,000 Units by mouth daily.    . Cranberry 1000 MG CAPS Take by mouth daily.    . fentaNYL (DURAGESIC - DOSED MCG/HR) 50 MCG/HR Place 1 patch (50 mcg total) onto the skin every 3 (three) days. 10 patch 0  . GLUCOSAMINE-CHONDROITIN PO Take 1 tablet by mouth 3 (three) times daily. STRENGTH: 1500-1200    . ibuprofen (ADVIL,MOTRIN) 600 MG tablet TAKE 1 TABLET BY MOUTH EVERY 6 HOURS AS NEEDED 50 tablet 1  . interferon beta-1a (REBIF) 44 MCG/0.5ML SOSY injection Inject 44 mcg into the skin 3 (three) times a week.    . levETIRAcetam (KEPPRA) 500 MG tablet Take 1 tablet (500 mg total) by mouth 2 (two) times daily. 180 tablet 4  . lidocaine-prilocaine (EMLA) cream Apply 1 application topically once. Apply to port then cover with saran wrap 1-2 hours before chemotherapy    . loratadine (CLARITIN) 10 MG tablet Take 10 mg by mouth daily.    . Misc Natural Products (LEG VEIN & CIRCULATION) TABS Take by mouth daily.    . Multiple Vitamins-Minerals (MULTIVITAMIN PO) Take 1 tablet by mouth daily.    Marland Kitchen oxybutynin (DITROPAN-XL) 10 MG 24 hr tablet TAKE 2 TABLETS BY MOUTH DAILY 180 tablet 0  . Oxycodone HCl 10 MG TABS Take 1 tablet (10 mg total) by mouth every 6 (six) hours as  needed. 120 tablet 0  . prochlorperazine (COMPAZINE) 10 MG tablet Take 1 tablet (10 mg total) by mouth every 6 (six) hours as needed. 30 tablet 1   No current facility-administered medications for this visit.    OBJECTIVE: Filed Vitals:   04/16/15 1125  BP: 154/83  Pulse: 73  Temp: 97.3 F (36.3 C)  Resp: 16     There is no weight on file to calculate BMI.    ECOG FS:1 - Symptomatic but completely ambulatory  General: Well-developed, well-nourished, no acute distress. Sitting in a wheelchair. Eyes: anicteric sclera. Breasts: Patient requested exam be deferred today. Lungs: Clear to auscultation bilaterally. Heart:  Regular rate and rhythm. No rubs, murmurs, or gallops. Abdomen: Soft, nontender, nondistended. No organomegaly noted, normoactive bowel sounds. Musculoskeletal: Bilateral lower extremity lymphedema. Neuro: Alert, answering all questions appropriately. Cranial nerves grossly intact. Skin: No rashes or petechiae noted. Psych: Normal affect.   LAB RESULTS:  Lab Results  Component Value Date   NA 136 07/17/2014   K 3.6 07/17/2014   CL 104 07/17/2014   CO2 28 07/17/2014   GLUCOSE 93 07/17/2014   BUN 21* 07/17/2014   CREATININE 0.46 07/17/2014   CALCIUM 9.1 07/17/2014   PROT 7.4 07/17/2014   ALBUMIN 4.5 07/17/2014   AST 19 07/17/2014   ALT 16 07/17/2014   ALKPHOS 73 07/17/2014   BILITOT 0.5 07/17/2014   GFRNONAA >60 07/17/2014   GFRAA >60 07/17/2014    Lab Results  Component Value Date   WBC 3.3* 07/17/2014   NEUTROABS 2.0 07/17/2014   HGB 11.5* 07/17/2014   HCT 35.0 07/17/2014   MCV 92.7 07/17/2014   PLT 130* 07/17/2014   Lab Results  Component Value Date   LABCA2 17.8 04/16/2015     STUDIES: No results found.  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 BRCA negative.  PLAN:    1. Breast cancer: No evidence of disease. CA-27-29 continues to be within normal limits. Because patient had a full mastectomy, she did  not require adjuvant XRT. Given the fact that she is a triple negative cancer, she will not require an aromatase inhibitor. Given her difficulties with Taxol and multiple sclerosis, treatment was discontinued after a total of 6 of 12 cycles of Taxol. Her last chemotherapy was July 17, 2014. Mammogram of her left breast March 11, 2015 reports Bi rads 1 to be repeated in one year.  Return to clinic in 3 months for laboratory work and further evaluation.  2. Multiple sclerosis: Patient is still on treatment.  3. Lymphedema: Continue treatment and wraps as prescribed. Patient also recently started PT.  4. Peripheral neuropathy: Improved. Monitor. 5. Pain: Continue fentanyl patch to 50 g and 10 mg oxycodone as needed for breakthrough pain.   Her primary neurologist is Dr. Andrey Spearman.  phone [phone], fax [phone].   Patient expressed understanding and was in agreement with this plan. She also understands that She can call clinic at any time with any questions, concerns, or complaints.   Breast cancer   Staging form: Breast, AJCC 7th Edition     Pathologic stage from 05/24/2014: Stage IA (T1c, N0, cM0) - Signed by Lloyd Huger, MD on 05/24/2014   Mayra Reel, NP   04/16/2015 2:11 PM   Patient was seen and evaluated independently and I agree with the assessment and plan as above.  Lloyd Huger, MD 04/17/2015 11:17 PM

## 2015-04-17 LAB — CANCER ANTIGEN 27.29: CA 27.29: 17.8 U/mL (ref 0.0–38.6)

## 2015-04-20 ENCOUNTER — Other Ambulatory Visit: Payer: Self-pay | Admitting: Family Medicine

## 2015-04-28 DIAGNOSIS — R262 Difficulty in walking, not elsewhere classified: Secondary | ICD-10-CM | POA: Diagnosis not present

## 2015-04-28 DIAGNOSIS — G35 Multiple sclerosis: Secondary | ICD-10-CM | POA: Diagnosis not present

## 2015-04-30 DIAGNOSIS — G35 Multiple sclerosis: Secondary | ICD-10-CM | POA: Diagnosis not present

## 2015-04-30 DIAGNOSIS — R262 Difficulty in walking, not elsewhere classified: Secondary | ICD-10-CM | POA: Diagnosis not present

## 2015-05-05 ENCOUNTER — Other Ambulatory Visit: Payer: Self-pay | Admitting: Diagnostic Neuroimaging

## 2015-05-06 ENCOUNTER — Ambulatory Visit: Payer: Medicare Other | Admitting: Cardiology

## 2015-05-08 ENCOUNTER — Other Ambulatory Visit: Payer: Self-pay | Admitting: *Deleted

## 2015-05-08 MED ORDER — FENTANYL 50 MCG/HR TD PT72: 50.0000 ug | MEDICATED_PATCH | TRANSDERMAL | Status: DC

## 2015-05-09 DIAGNOSIS — G35 Multiple sclerosis: Secondary | ICD-10-CM | POA: Diagnosis not present

## 2015-05-09 DIAGNOSIS — R262 Difficulty in walking, not elsewhere classified: Secondary | ICD-10-CM | POA: Diagnosis not present

## 2015-05-12 DIAGNOSIS — G35 Multiple sclerosis: Secondary | ICD-10-CM | POA: Diagnosis not present

## 2015-05-12 DIAGNOSIS — R262 Difficulty in walking, not elsewhere classified: Secondary | ICD-10-CM | POA: Diagnosis not present

## 2015-05-14 ENCOUNTER — Other Ambulatory Visit: Payer: Self-pay | Admitting: *Deleted

## 2015-05-14 DIAGNOSIS — C50919 Malignant neoplasm of unspecified site of unspecified female breast: Secondary | ICD-10-CM

## 2015-05-14 MED ORDER — OXYCODONE HCL 10 MG PO TABS: 10.0000 mg | ORAL_TABLET | Freq: Four times a day (QID) | ORAL | Status: DC | PRN

## 2015-05-25 ENCOUNTER — Other Ambulatory Visit: Payer: Self-pay | Admitting: *Deleted

## 2015-05-25 MED ORDER — PROCHLORPERAZINE MALEATE 10 MG PO TABS: 10.0000 mg | ORAL_TABLET | Freq: Four times a day (QID) | ORAL | Status: DC | PRN

## 2015-05-27 ENCOUNTER — Encounter: Payer: Medicare Other | Admitting: Cardiology

## 2015-05-27 NOTE — Progress Notes (Deleted)
PCP: Bobetta Lime, MD  Clinic Note: No chief complaint on file.   HPI: Rebecca [name] is a 55 y.o. female with a PMH below who presents today for a delayed annual follow-up for chronic lower extremity edema that is probably multifactorial but is mostly related to her long-standing multiple sclerosis with muscle atrophy.  Rebecca [name] was last seen on 03/12/2014. Really the only thing that was noted was or edema. Not associated with any other heart failure symptoms of PND, orthopnea or exertional dyspnea. Noted. Edema was doing better with home care, but had a temporary setback when the technician was not able to come out.  Recent Hospitalizations: None  Studies Reviewed: None  Interval History: ***   No chest pain or shortness of breath with rest or exertion. No PND, orthopnea or edema. No palpitations, lightheadedness, dizziness, weakness or syncope/near syncope. No TIA/amaurosis fugax symptoms. No melena, hematochezia, hematuria, or epstaxis. No claudication.  ROS: A comprehensive was performed. ROS   Past Medical History  Diagnosis Date  . Herpes zoster   . Multiple sclerosis ([place]) 2001    Walks from room to room @ home; but Wheelchair when going out.  . Closed head injury with brief loss of consciousness ([place])   . Seizures ([place])     Takes Keppra  . IBS (irritable bowel syndrome)     no longer  . Bacterial endocarditis     History of .  Marland Kitchen Aortic valve disease     Mild AS / AI (cannot exclude Bicuspid AoV)  . Syncope and collapse   . Bilateral lower extremity edema     Noncardiac.  Chronic. LE Venous dopplers - negative for DVT.; Echocardiogram January 2016: Normal EF with normal wall motion and valve function. Only grade 1 diastolic dysfunction. EF 60-65%. Mild MR  . Neuromuscular disorder ([place])     MS  . Heart murmur   . Lymphedema     has legs wrapped at [facility]  . Cervical stenosis of spine   . MS (multiple sclerosis) ([place])   . Irritable bowel   . Cancer  [facility]) 12-31-13    Right breast, 12:00, 1.5 cm, T1c,N0 invasive mammary carcinoma, triple negative.  . Breast cancer [facility])     chemo    Past Surgical History  Procedure Laterality Date  . Cholecystectomy    . Ankle surgery      Left  . Port-a-cath removal      right  . Anterior cervical decomp/discectomy fusion  11/17/2011    Procedure: ANTERIOR CERVICAL DECOMPRESSION/DISCECTOMY FUSION 2 LEVELS;  Surgeon: Erline Levine, MD;  Location: [place] NEURO ORS;  Service: Neurosurgery;  Laterality: N/A;  Cervical Five-Six Six-Seven Anterior cervical decompression/diskectomy/fusion  . Transthoracic echocardiogram  03/2013; 02/2014    a) Normal LV size and function with EF 60-65%.; Cannot exclude bicuspid aortic valve with mild AS and mild AI.; b) Normal EF with normal wall motion and valve function x Mild MR. G2 DD. EF 60-65%. Tricuspid AoV  . Lower extremity venous dopplers  Feb 27, 2013    No LE DVT  . Port a cath insertion Right 01/19/2010  . Colonoscopy  2014  . Upper gi endoscopy  2014  . Port-a-cath removal Right 09/03/2013    Procedure: REMOVAL PORT-A-CATH;  Surgeon: Conrad [place], MD;  Location: [facility];  Service: Vascular;  Laterality: Right;  . Breast biopsy Right 12-31-13    invasive mammary  . Breast surgery Right 02/03/2014    Right simple mastectomy  with sentinel node biopsy.  . Mastectomy    . Ankle surgery      @hmed @ Allergies  Allergen Reactions  . Fentanyl Nausea And Vomiting and Nausea Only    vomiting Was given in PACU x3 each time patient got sick vomiting Was given in PACU x3 each time patient got sick  . Sulfa Antibiotics Hives and Other (See Comments)    Light headed, over heated     Social History   Social History  . Marital Status: Married    Spouse Name: don  . Number of Children: 0  . Years of Education: 12   Occupational History  . disability    Social History Main Topics  . Smoking status: Never Smoker   . Smokeless tobacco: Never Used  . Alcohol  Use: No  . Drug Use: No  . Sexual Activity:    Partners: Male   Other Topics Concern  . Not on file   Social History Narrative   She is married. Recently moved back to [place] after being in [place] for some time. She is accompanied by her husband and aunt.   Never smoked. Never used alcohol.   Family History  Problem Relation Age of Onset  . Cancer Father     skin  . Heart disease Father   . Heart attack Father   . Breast cancer Maternal Aunt 60  . Breast cancer Maternal Grandmother 74     Wt Readings from Last 3 Encounters:  10/24/14 191 lb (86.637 kg)  09/30/14 210 lb (95.255 kg)  05/12/14 210 lb 15.7 oz (95.7 kg)    PHYSICAL EXAM There were no vitals taken for this visit. General appearance: alert, cooperative, appears stated age, no distress and *** obese Neck: no adenopathy, no carotid bruit and no JVD Lungs: clear to auscultation bilaterally, normal percussion bilaterally and non-labored Heart: regular rate and rhythm, S1, S2 normal, no murmur, click, rub or gallop *** Abdomen: soft, non-tender; bowel sounds normal; no masses,  no organomegaly; *** Extremities: extremities normal, atraumatic, no cyanosis, and edema *** Pulses: 2+ and symmetric; *** Skin: {normal findings:33173} or {abnormal findings:33192} Neurologic: Mental status: Alert, oriented, thought content appropriate Cranial nerves: normal (II-XII grossly intact)    Adult ECG Report  Rate: *** ;  Rhythm: {rhythm:17366};   Narrative Interpretation: ***   Other studies Reviewed: Additional studies/ records that were reviewed today include:  Recent Labs:  ***     ASSESSMENT / PLAN: Problem List Items Addressed This Visit    None      Current medicines are reviewed at length with the patient today. (+/- concerns) *** The following changes have been made: *** Studies Ordered:   No orders of the defined types were placed in this encounter.      Leonie Man, M.D.,  M.S. Interventional Cardiologist   Pager # [phone] Phone # [phone] [address] [place], [place] [address]

## 2015-05-28 ENCOUNTER — Inpatient Hospital Stay: Payer: Medicare Other

## 2015-05-29 ENCOUNTER — Inpatient Hospital Stay: Payer: Medicare Other

## 2015-06-01 NOTE — Progress Notes (Signed)
This encounter was created in error - please disregard.

## 2015-06-02 ENCOUNTER — Inpatient Hospital Stay: Payer: Medicare Other

## 2015-06-04 ENCOUNTER — Inpatient Hospital Stay: Payer: Medicare Other

## 2015-06-04 DIAGNOSIS — G35 Multiple sclerosis: Secondary | ICD-10-CM | POA: Diagnosis not present

## 2015-06-04 DIAGNOSIS — R262 Difficulty in walking, not elsewhere classified: Secondary | ICD-10-CM | POA: Diagnosis not present

## 2015-06-08 ENCOUNTER — Other Ambulatory Visit: Payer: Self-pay | Admitting: Diagnostic Neuroimaging

## 2015-06-08 ENCOUNTER — Other Ambulatory Visit: Payer: Self-pay

## 2015-06-08 MED ORDER — IBUPROFEN 600 MG PO TABS: 600.0000 mg | ORAL_TABLET | Freq: Four times a day (QID) | ORAL | Status: DC | PRN

## 2015-06-08 NOTE — Telephone Encounter (Signed)
Serum creatinine June 2016 reviewed; Rx approved; saw Dr. Nadine Counts in Sept

## 2015-06-10 ENCOUNTER — Ambulatory Visit: Payer: Medicare Other | Admitting: Cardiology

## 2015-06-11 ENCOUNTER — Inpatient Hospital Stay: Payer: Medicare Other

## 2015-06-15 ENCOUNTER — Other Ambulatory Visit: Payer: Self-pay | Admitting: *Deleted

## 2015-06-15 DIAGNOSIS — C50919 Malignant neoplasm of unspecified site of unspecified female breast: Secondary | ICD-10-CM

## 2015-06-15 MED ORDER — OXYCODONE HCL 10 MG PO TABS: 10.0000 mg | ORAL_TABLET | Freq: Four times a day (QID) | ORAL | Status: DC | PRN

## 2015-06-16 ENCOUNTER — Inpatient Hospital Stay: Payer: Medicare Other

## 2015-06-17 ENCOUNTER — Ambulatory Visit (INDEPENDENT_AMBULATORY_CARE_PROVIDER_SITE_OTHER): Payer: Medicare Other | Admitting: Cardiology

## 2015-06-17 ENCOUNTER — Inpatient Hospital Stay: Payer: Medicare Other | Attending: Oncology

## 2015-06-17 ENCOUNTER — Encounter: Payer: Self-pay | Admitting: Cardiology

## 2015-06-17 VITALS — BP 124/78 | HR 76 | Ht 64.0 in | Wt 183.0 lb

## 2015-06-17 DIAGNOSIS — I359 Nonrheumatic aortic valve disorder, unspecified: Secondary | ICD-10-CM

## 2015-06-17 DIAGNOSIS — G629 Polyneuropathy, unspecified: Secondary | ICD-10-CM | POA: Diagnosis not present

## 2015-06-17 DIAGNOSIS — G35 Multiple sclerosis: Secondary | ICD-10-CM | POA: Diagnosis not present

## 2015-06-17 DIAGNOSIS — R011 Cardiac murmur, unspecified: Secondary | ICD-10-CM

## 2015-06-17 DIAGNOSIS — Z95828 Presence of other vascular implants and grafts: Secondary | ICD-10-CM

## 2015-06-17 DIAGNOSIS — Z79899 Other long term (current) drug therapy: Secondary | ICD-10-CM | POA: Insufficient documentation

## 2015-06-17 DIAGNOSIS — C50911 Malignant neoplasm of unspecified site of right female breast: Secondary | ICD-10-CM | POA: Insufficient documentation

## 2015-06-17 DIAGNOSIS — R600 Localized edema: Secondary | ICD-10-CM

## 2015-06-17 DIAGNOSIS — I890 Lymphedema, not elsewhere classified: Secondary | ICD-10-CM | POA: Diagnosis not present

## 2015-06-17 DIAGNOSIS — Z452 Encounter for adjustment and management of vascular access device: Secondary | ICD-10-CM | POA: Insufficient documentation

## 2015-06-17 DIAGNOSIS — Z171 Estrogen receptor negative status [ER-]: Secondary | ICD-10-CM | POA: Insufficient documentation

## 2015-06-17 DIAGNOSIS — Z9221 Personal history of antineoplastic chemotherapy: Secondary | ICD-10-CM | POA: Insufficient documentation

## 2015-06-17 DIAGNOSIS — Z9011 Acquired absence of right breast and nipple: Secondary | ICD-10-CM | POA: Insufficient documentation

## 2015-06-17 MED ORDER — SODIUM CHLORIDE 0.9% FLUSH
10.0000 mL | INTRAVENOUS | Status: DC | PRN
  Administered 2015-06-17: 10 mL via INTRAVENOUS
  Filled 2015-06-17: qty 10

## 2015-06-17 MED ORDER — HEPARIN SOD (PORK) LOCK FLUSH 100 UNIT/ML IV SOLN
500.0000 [IU] | Freq: Once | INTRAVENOUS | Status: AC
  Administered 2015-06-17: 500 [IU] via INTRAVENOUS

## 2015-06-17 NOTE — Progress Notes (Signed)
PCP: Bobetta Lime, MD  Clinic Note: Chief Complaint  Patient presents with  . Follow-up    12 month follow up. Meds reviewed by the patient verbally. "doing well."   . Cardiac Valve Problem    mild AS/AI  . Leg Swelling    lymphedema & venous stasis.    HPI: Rebecca [name] is a 55 y.o. female with a PMH below who presents today for > 39yr f/u for chronic LE edema (mostly related to Lymphedema).  She has long-standing multiple sclerosis with muscle atrophy in his lower extremities. This is partly the cause of her edema with venous stasis. She usually uses Unaboot wraps for mechanical compression.  There is reported history of aortic insufficiency with possible bicuspid aortic valve in the past. She had an echocardiogram in January 2016 which pretty much excluded that.  Rebecca [name] was last seen on 03/12/2014. She was doing fairly well at that time. She was diagnosed with breast cancer and had a right mastectomy with lymph node dissection. She completed chemotherapy and apparently has had full "cure for now.  Studies Reviewed: None  Interval History: [name] presents today really doing quite well from a cardiac standpoint. Her edema has significantly improved with the use of compression stockings and pressure boots that she wears. Her husband indicates that her swelling is all but gone now. She has lost a significant amount of weight from fluid weight. This is all without having to be on diuretic.  As a result, she really has no PND or orthopnea. She remains wheelchair bound.  She really denies any cardiac symptoms:    No chest pain or shortness of breath with rest or exertion. No PND, orthopnea.  No palpitations, lightheadedness, dizziness, weakness or syncope/near syncope. No TIA/amaurosis fugax symptoms.  ROS: A comprehensive was performed. Review of Systems  Constitutional: Positive for weight loss.  HENT: Negative for congestion and nosebleeds.   Respiratory: Negative  for sputum production and wheezing.   Cardiovascular: Positive for leg swelling (Notably improved).       Per history of present illness  Gastrointestinal: Negative for blood in stool and melena.  Genitourinary: Negative for hematuria.  Musculoskeletal: Positive for myalgias and joint pain.  Neurological: Negative for dizziness and headaches.  Endo/Heme/Allergies: Does not bruise/bleed easily.  Psychiatric/Behavioral: Negative for depression and memory loss. The patient is not nervous/anxious and does not have insomnia.   All other systems reviewed and are negative.    Past Medical History  Diagnosis Date  . Herpes zoster   . Multiple sclerosis ([place]) 2001    Walks from room to room @ home; but Wheelchair when going out.  . Closed head injury with brief loss of consciousness ([place])   . Seizures ([place])     Takes Keppra  . IBS (irritable bowel syndrome)     no longer  . Bacterial endocarditis     History of .  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  . Syncope and collapse   . Bilateral lower extremity edema     Noncardiac.  Chronic. LE Venous dopplers - negative for DVT.; Echocardiogram January 2016: Normal EF with normal wall motion and valve function. Only grade 1 diastolic dysfunction. EF 60-65%. Mild MR  . Neuromuscular disorder ([place])     MS  . Lymphedema     has legs wrapped at [facility]  . Cervical stenosis of spine   . Irritable  bowel   . Breast cancer ([place]) 12-31-13    Right breast, 12:00, 1.5 cm, T1c,N0 invasive mammary carcinoma, triple negative. --> Rx with Chemo    Past Surgical History  Procedure Laterality Date  . Cholecystectomy    . Ankle surgery      Left  . Port-a-cath removal      right  . Anterior cervical decomp/discectomy fusion  11/17/2011    Procedure: ANTERIOR CERVICAL DECOMPRESSION/DISCECTOMY FUSION 2 LEVELS;  Surgeon: Erline Levine, MD;  Location: [facility];  Service: Neurosurgery;  Laterality:  N/A;  Cervical Five-Six Six-Seven Anterior cervical decompression/diskectomy/fusion  . Transthoracic echocardiogram  03/2013; 02/2014    a) Normal LV size and function with EF 60-65%.; Cannot exclude bicuspid aortic valve with mild AS and mild AI.; b) Normal EF with normal wall motion and valve function x Mild MR. G2 DD. EF 60-65%. Tricuspid AoV  . Lower extremity venous dopplers  Feb 27, 2013    No LE DVT  . Port a cath insertion Right 01/19/2010  . Colonoscopy  2014  . Upper gi endoscopy  2014  . Port-a-cath removal Right 09/03/2013    Procedure: REMOVAL PORT-A-CATH;  Surgeon: Conrad Whitewater, MD;  Location: [facility];  Service: Vascular;  Laterality: Right;  . Breast biopsy Right 12-31-13    invasive mammary  . Breast surgery Right 02/03/2014    Right simple mastectomy with sentinel node biopsy.  . Mastectomy    . Ankle surgery     Prior to Admission medications   Medication Sig Start Date End Date Taking? Authorizing Provider  amantadine (SYMMETREL) 100 MG capsule TAKE 3 CAPSULES BY MOUTH EVERY DAY 05/06/15  Yes Penni Bombard, MD  baclofen (LIORESAL) 10 MG tablet Take 1-2 tablets (10-20 mg total) by mouth 3 (three) times daily. 03/23/15  Yes Penni Bombard, MD  Cholecalciferol (VITAMIN D3) 2000 UNITS capsule Take 2,000 Units by mouth daily.   Yes Historical Provider, MD  Cranberry 1000 MG CAPS Take by mouth daily.   Yes Historical Provider, MD  fentaNYL (DURAGESIC - DOSED MCG/HR) 50 MCG/HR Place 1 patch (50 mcg total) onto the skin every 3 (three) days. 05/08/15  Yes Lloyd Huger, MD  GLUCOSAMINE-CHONDROITIN PO Take 1 tablet by mouth 3 (three) times daily. STRENGTH: 1500-1200   Yes Historical Provider, MD  ibuprofen (ADVIL,MOTRIN) 600 MG tablet Take 1 tablet (600 mg total) by mouth every 6 (six) hours as needed. 06/08/15  Yes [name], MD  interferon beta-1a (REBIF) 44 MCG/0.5ML SOSY injection Inject 44 mcg into the skin 3 (three) times a week.   Yes Historical Provider, MD    levETIRAcetam (KEPPRA) 500 MG tablet Take 1 tablet (500 mg total) by mouth 2 (two) times daily. 03/23/15  Yes Penni Bombard, MD  lidocaine-prilocaine (EMLA) cream Apply 1 application topically once. Apply to port then cover with saran wrap 1-2 hours before chemotherapy   Yes Historical Provider, MD  loratadine (CLARITIN) 10 MG tablet Take 10 mg by mouth daily.   Yes Historical Provider, MD  Misc Natural Products (LEG VEIN & CIRCULATION) TABS Take by mouth daily.   Yes Historical Provider, MD  Multiple Vitamins-Minerals (MULTIVITAMIN PO) Take 1 tablet by mouth daily.   Yes Historical Provider, MD  oxybutynin (DITROPAN-XL) 10 MG 24 hr tablet TAKE 2 TABLETS BY MOUTH DAILY 06/08/15  Yes Penni Bombard, MD  Oxycodone HCl 10 MG TABS Take 1 tablet (10 mg total) by mouth every 6 (six) hours as needed. 06/15/15  Yes Lloyd Huger, MD  prochlorperazine (COMPAZINE) 10 MG tablet Take 1 tablet (10 mg total) by mouth every 6 (six) hours as needed. 05/25/15  Yes Lloyd Huger, MD     Allergies  Allergen Reactions  . Fentanyl Nausea And Vomiting and Nausea Only    vomiting Was given in PACU x3 each time patient got sick vomiting Was given in PACU x3 each time patient got sick  . Sulfa Antibiotics Hives and Other (See Comments)    Light headed, over heated    Social History   Social History  . Marital Status: Married    Spouse Name: don  . Number of Children: 0  . Years of Education: 12   Occupational History  . disability    Social History Main Topics  . Smoking status: Never Smoker   . Smokeless tobacco: Never Used  . Alcohol Use: No  . Drug Use: No  . Sexual Activity:    Partners: Male   Other Topics Concern  . None   Social History Narrative   She is married. Recently moved back to [place] after being in [place] for some time. She is accompanied by her husband and aunt.   Never smoked. Never used alcohol.   Family History  Problem Relation Age of Onset  .  Cancer Father     skin  . Heart disease Father   . Heart attack Father   . Breast cancer Maternal Aunt 60  . Breast cancer Maternal Grandmother 74    Wt Readings from Last 3 Encounters:  06/17/15 183 lb (83.008 kg)  10/24/14 191 lb (86.637 kg)  09/30/14 210 lb (95.255 kg)    PHYSICAL EXAM BP 124/78 mmHg  Pulse 76  Ht 5\' 4"  (1.626 m)  Wt 183 lb (83.008 kg)  BMI 31.40 kg/m2 General appearance: alert, cooperative, appears stated age, no distress and Probably not really obese. She has a significant amount of weight in her legs. However per her report, and edema has all but gone. Cannot tell because she is wearing his compression stockings and boots. Neck: no adenopathy, no carotid bruit and no JVD Lungs: clear to auscultation bilaterally, normal percussion bilaterally and non-labored Heart: regular rate and rhythm, S1 and S2 normal, no click, rub or gallop - soft 1/6 DM and 1-2/6 SEM at RUSB. Abdomen: soft, non-tender; bowel sounds normal; no masses,  no organomegaly;  Extremities: extremities normal, atraumatic, no cyanosis, and edema difficult to assess with compression boot Pulses: 2+ and symmetric -bilateral radial pulses. Cannot palpate pedal pulses. Skin: mobility and turgor normal  Neurologic: Mental status: Alert, oriented, thought content appropriate Cranial nerves: normal (II-XII grossly intact)    Adult ECG Report  Rate: 71 ;  Rhythm: normal sinus rhythm and Normal axis, intervals and durations. Normal EKG;   Narrative Interpretation: Stable/normal EKG   Other studies Reviewed: Additional studies/ records that were reviewed today include:  Recent Labs:  N/a    ASSESSMENT / PLAN: Problem List Items Addressed This Visit    Lymphedema of both lower extremities   Bilateral lower extremity edema (Chronic)    Noncardiac. Doing much better with compression stockings and compression boots. Clearly has lost several pounds of fluid weight.      Aortic valve disease -  Primary (Chronic)    Probably reasonable to follow-up an echocardiogram 3 now in January 2 year follow-up to reevaluate her aortic valve. If it stable at that time, I think we can probably hold off and  not repeat every 2 years but maybe every 3.  He is not having any symptoms.      Relevant Orders   EKG 12-Lead (Completed)   Echocardiogram      Current medicines are reviewed at length with the patient today. (+/- concerns) none The following changes have been made: none  Studies Ordered:   Orders Placed This Encounter  Procedures  . EKG 12-Lead  . Echocardiogram  Testing/Procedures: Your physician has requested that you have an echocardiogram, January 2018.   Follow-Up:  schedule a follow-up appointment as suggested after your echo results.    Glenetta Hew, M.D., M.S. Interventional Cardiologist   Pager # [phone] Phone # [phone] [address]. [place], [place] [address]

## 2015-06-17 NOTE — Patient Instructions (Addendum)
Medication Instructions:  Your physician recommends that you continue on your current medications as directed. Please refer to the Current Medication list given to you today.   Labwork: none  Testing/Procedures: Your physician has requested that you have an echocardiogram, January 2018. Echocardiography is a painless test that uses sound waves to create images of your heart. It provides your doctor with information about the size and shape of your heart and how well your heart's chambers and valves are working. This procedure takes approximately one hour. There are no restrictions for this procedure.    Follow-Up: Your physician recommends that you schedule a follow-up appointment as suggested after your echo results.    Any Other Special Instructions Will Be Listed Below (If Applicable).     If you need a refill on your cardiac medications before your next appointment, please call your pharmacy.  Echocardiogram An echocardiogram, or echocardiography, uses sound waves (ultrasound) to produce an image of your heart. The echocardiogram is simple, painless, obtained within a short period of time, and offers valuable information to your health care provider. The images from an echocardiogram can provide information such as:  Evidence of coronary artery disease (CAD).  Heart size.  Heart muscle function.  Heart valve function.  Aneurysm detection.  Evidence of a past heart attack.  Fluid buildup around the heart.  Heart muscle thickening.  Assess heart valve function. LET [facility] CARE PROVIDER KNOW ABOUT:  Any allergies you have.  All medicines you are taking, including vitamins, herbs, eye drops, creams, and over-the-counter medicines.  Previous problems you or members of your family have had with the use of anesthetics.  Any blood disorders you have.  Previous surgeries you have had.  Medical conditions you have.  Possibility of pregnancy, if this  applies. BEFORE THE PROCEDURE  No special preparation is needed. Eat and drink normally.  PROCEDURE   In order to produce an image of your heart, gel will be applied to your chest and a wand-like tool (transducer) will be moved over your chest. The gel will help transmit the sound waves from the transducer. The sound waves will harmlessly bounce off your heart to allow the heart images to be captured in real-time motion. These images will then be recorded.  You may need an IV to receive a medicine that improves the quality of the pictures. AFTER THE PROCEDURE You may return to your normal schedule including diet, activities, and medicines, unless your health care provider tells you otherwise.   This information is not intended to replace advice given to you by your health care provider. Make sure you discuss any questions you have with your health care provider.   Document Released: 01/22/2000 Document Revised: 02/14/2014 Document Reviewed: 10/01/2012 Elsevier Interactive Patient Education [employer].

## 2015-06-18 ENCOUNTER — Inpatient Hospital Stay: Payer: Medicare Other

## 2015-06-19 ENCOUNTER — Encounter: Payer: Self-pay | Admitting: Cardiology

## 2015-06-19 NOTE — Assessment & Plan Note (Signed)
Probably reasonable to follow-up an echocardiogram 3 now in January 2 year follow-up to reevaluate her aortic valve. If it stable at that time, I think we can probably hold off and not repeat every 2 years but maybe every 3.  He is not having any symptoms.

## 2015-06-19 NOTE — Assessment & Plan Note (Signed)
Noncardiac. Doing much better with compression stockings and compression boots. Clearly has lost several pounds of fluid weight.

## 2015-06-19 NOTE — Assessment & Plan Note (Signed)
>>  ASSESSMENT AND PLAN FOR BILATERAL LOWER EXTREMITY EDEMA WRITTEN ON 06/19/2015 10:54 PM BY HARDING, Piedad Climes, MD  Noncardiac. Doing much better with compression stockings and compression boots. Clearly has lost several pounds of fluid weight.

## 2015-06-26 ENCOUNTER — Other Ambulatory Visit: Payer: Self-pay | Admitting: *Deleted

## 2015-06-26 MED ORDER — PROCHLORPERAZINE MALEATE 10 MG PO TABS: 10.0000 mg | ORAL_TABLET | Freq: Four times a day (QID) | ORAL | Status: DC | PRN

## 2015-06-26 MED ORDER — FENTANYL 50 MCG/HR TD PT72: 50.0000 ug | MEDICATED_PATCH | TRANSDERMAL | Status: DC

## 2015-07-07 ENCOUNTER — Telehealth: Payer: Self-pay | Admitting: Diagnostic Neuroimaging

## 2015-07-07 DIAGNOSIS — G35 Multiple sclerosis: Secondary | ICD-10-CM

## 2015-07-07 NOTE — Telephone Encounter (Signed)
Pt sts she will need updated referral to [facility] [phone] on [address], the last one has expired. Pt has appt on Friday 6/2 and will need it sent prior to this.

## 2015-07-20 ENCOUNTER — Other Ambulatory Visit: Payer: Self-pay | Admitting: *Deleted

## 2015-07-20 DIAGNOSIS — C50919 Malignant neoplasm of unspecified site of unspecified female breast: Secondary | ICD-10-CM

## 2015-07-20 MED ORDER — OXYCODONE HCL 10 MG PO TABS: 10.0000 mg | ORAL_TABLET | Freq: Four times a day (QID) | ORAL | Status: DC | PRN

## 2015-07-23 ENCOUNTER — Inpatient Hospital Stay: Payer: Medicare Other

## 2015-07-23 ENCOUNTER — Inpatient Hospital Stay: Payer: Medicare Other | Admitting: Oncology

## 2015-07-23 ENCOUNTER — Other Ambulatory Visit: Payer: Self-pay | Admitting: *Deleted

## 2015-07-23 MED ORDER — PROCHLORPERAZINE MALEATE 10 MG PO TABS: 10.0000 mg | ORAL_TABLET | Freq: Four times a day (QID) | ORAL | Status: DC | PRN

## 2015-07-28 ENCOUNTER — Telehealth: Payer: Self-pay | Admitting: Diagnostic Neuroimaging

## 2015-07-28 NOTE — Telephone Encounter (Signed)
Spoke with Caren Griffins who stated patient has only come for 7 PT appointments since beginning on Apr 02, 2015. She stated she last saw pt on 05/12/15 and again 06/04/15, but she stated the patient had become weaker at her last visit. She stated that the patient had multiple reasons why she couldn't come for PT.  She stated she is concerned that the patient is not getting good care and is becoming weaker without therapy. She recommends inpatient or in home PT. Informed her that this RN will call patient to come in sooner to se Dr Leta Baptist.  At that time he can evaluate her needs and make proper referrals. She verbalized understanding, agreement, appreciation.  Spoke with patient and informed her that this RN spoke with Caren Griffins and that this RN would like to schedule her to see Dr Leta Baptist asap for him to evaluate her for possible HH PT. She stated she would like to be seen sooner. Scheduled FU for 08/06/15, advised she arrive 15 min early. She verbalized understanding, appreciation.

## 2015-07-28 NOTE — Telephone Encounter (Signed)
Rebecca [name] with [facility] is calling with much concern about this patient.  She states she has not seen her since 06/04/15 and noticed she had regressed  She makes appointments and always has a reason to cancel.  She is concerned that patient is not getting a good level of care at home and living in unclean environment not optimal for her best care. She is wondering if the patient might benefit from in-patient rehab as she would get daily rehab and care.  After letting Caren Griffins know patient's next appointment with [place] is in August she is suggesting that she be brought in sooner and evaluated.  Caren Griffins said she would be available to discuss if someone would like to call her.  Thanks!

## 2015-07-30 ENCOUNTER — Other Ambulatory Visit: Payer: Self-pay | Admitting: *Deleted

## 2015-07-30 MED ORDER — FENTANYL 50 MCG/HR TD PT72: 50.0000 ug | MEDICATED_PATCH | TRANSDERMAL | Status: DC

## 2015-08-04 DIAGNOSIS — G35 Multiple sclerosis: Secondary | ICD-10-CM | POA: Diagnosis not present

## 2015-08-04 DIAGNOSIS — R262 Difficulty in walking, not elsewhere classified: Secondary | ICD-10-CM | POA: Diagnosis not present

## 2015-08-05 ENCOUNTER — Inpatient Hospital Stay: Payer: Medicare Other

## 2015-08-05 ENCOUNTER — Inpatient Hospital Stay (HOSPITAL_BASED_OUTPATIENT_CLINIC_OR_DEPARTMENT_OTHER): Payer: Medicare Other | Admitting: Oncology

## 2015-08-05 ENCOUNTER — Inpatient Hospital Stay: Payer: Medicare Other | Attending: Oncology

## 2015-08-05 VITALS — BP 133/78 | HR 73 | Temp 97.1°F | Resp 18 | Wt 170.0 lb

## 2015-08-05 DIAGNOSIS — C50911 Malignant neoplasm of unspecified site of right female breast: Secondary | ICD-10-CM | POA: Diagnosis not present

## 2015-08-05 DIAGNOSIS — Z9221 Personal history of antineoplastic chemotherapy: Secondary | ICD-10-CM

## 2015-08-05 DIAGNOSIS — Z171 Estrogen receptor negative status [ER-]: Secondary | ICD-10-CM | POA: Insufficient documentation

## 2015-08-05 DIAGNOSIS — G35 Multiple sclerosis: Secondary | ICD-10-CM | POA: Insufficient documentation

## 2015-08-05 DIAGNOSIS — Z9013 Acquired absence of bilateral breasts and nipples: Secondary | ICD-10-CM | POA: Diagnosis not present

## 2015-08-05 DIAGNOSIS — G629 Polyneuropathy, unspecified: Secondary | ICD-10-CM | POA: Insufficient documentation

## 2015-08-05 DIAGNOSIS — I890 Lymphedema, not elsewhere classified: Secondary | ICD-10-CM | POA: Diagnosis not present

## 2015-08-05 DIAGNOSIS — C50912 Malignant neoplasm of unspecified site of left female breast: Secondary | ICD-10-CM

## 2015-08-05 DIAGNOSIS — K589 Irritable bowel syndrome without diarrhea: Secondary | ICD-10-CM | POA: Insufficient documentation

## 2015-08-05 DIAGNOSIS — Z95828 Presence of other vascular implants and grafts: Secondary | ICD-10-CM

## 2015-08-05 DIAGNOSIS — Z79899 Other long term (current) drug therapy: Secondary | ICD-10-CM

## 2015-08-05 MED ORDER — HEPARIN SOD (PORK) LOCK FLUSH 100 UNIT/ML IV SOLN
500.0000 [IU] | Freq: Once | INTRAVENOUS | Status: AC
  Administered 2015-08-05: 500 [IU] via INTRAVENOUS

## 2015-08-05 MED ORDER — SODIUM CHLORIDE 0.9% FLUSH
10.0000 mL | INTRAVENOUS | Status: DC | PRN
  Administered 2015-08-05: 10 mL via INTRAVENOUS
  Filled 2015-08-05: qty 10

## 2015-08-05 NOTE — Progress Notes (Signed)
States is feeling well. Offers no complaints. 

## 2015-08-06 ENCOUNTER — Encounter: Payer: Self-pay | Admitting: Diagnostic Neuroimaging

## 2015-08-06 ENCOUNTER — Other Ambulatory Visit: Payer: Self-pay | Admitting: Family Medicine

## 2015-08-06 ENCOUNTER — Ambulatory Visit (INDEPENDENT_AMBULATORY_CARE_PROVIDER_SITE_OTHER): Payer: Medicare Other | Admitting: Diagnostic Neuroimaging

## 2015-08-06 ENCOUNTER — Other Ambulatory Visit: Payer: Self-pay | Admitting: Diagnostic Neuroimaging

## 2015-08-06 VITALS — BP 104/65 | HR 76 | Wt 177.0 lb

## 2015-08-06 DIAGNOSIS — C801 Malignant (primary) neoplasm, unspecified: Secondary | ICD-10-CM | POA: Diagnosis not present

## 2015-08-06 DIAGNOSIS — E559 Vitamin D deficiency, unspecified: Secondary | ICD-10-CM | POA: Diagnosis not present

## 2015-08-06 DIAGNOSIS — R5383 Other fatigue: Secondary | ICD-10-CM | POA: Diagnosis not present

## 2015-08-06 DIAGNOSIS — G40909 Epilepsy, unspecified, not intractable, without status epilepticus: Secondary | ICD-10-CM

## 2015-08-06 DIAGNOSIS — C50911 Malignant neoplasm of unspecified site of right female breast: Secondary | ICD-10-CM | POA: Diagnosis not present

## 2015-08-06 DIAGNOSIS — R51 Headache: Secondary | ICD-10-CM

## 2015-08-06 DIAGNOSIS — R519 Headache, unspecified: Secondary | ICD-10-CM

## 2015-08-06 DIAGNOSIS — G35 Multiple sclerosis: Secondary | ICD-10-CM | POA: Diagnosis not present

## 2015-08-06 DIAGNOSIS — M62838 Other muscle spasm: Secondary | ICD-10-CM | POA: Diagnosis not present

## 2015-08-06 LAB — CANCER ANTIGEN 27.29: CA 27.29: 18.5 U/mL (ref 0.0–38.6)

## 2015-08-06 MED ORDER — BACLOFEN 10 MG PO TABS: 10.0000 mg | ORAL_TABLET | Freq: Three times a day (TID) | ORAL | Status: DC

## 2015-08-06 MED ORDER — AMANTADINE HCL 100 MG PO CAPS: 300.0000 mg | ORAL_CAPSULE | Freq: Every day | ORAL | Status: DC

## 2015-08-06 MED ORDER — LEVETIRACETAM 500 MG PO TABS: 500.0000 mg | ORAL_TABLET | Freq: Two times a day (BID) | ORAL | Status: DC

## 2015-08-06 NOTE — Telephone Encounter (Signed)
Left voicemail with details.

## 2015-08-06 NOTE — Telephone Encounter (Signed)
Please ask patient to talk with Dr. Grayland Ormond about this prescription; many cancer patients aren't supposed to be on ibuprofen because of platelet issues

## 2015-08-06 NOTE — Progress Notes (Signed)
[facility]  PATIENT: Rebecca [name] DOB: May 18, 1960  REFERRING CLINICIAN:  HISTORY FROM: patient, husband REASON FOR VISIT: follow up   HISTORICAL  CHIEF COMPLAINT:  Chief Complaint  Patient presents with  . Multiple Sclerosis    rm 6, Rebif, 09/2014 MRI Brain, needs Amantidine refill, husband- [name], "recent c/o headaches"  . Follow-up    early FU to eval for [facility] PT    HISTORY OF PRESENT ILLNESS:   UPDATE 08/06/15: Since last visit, overall stable. MS sxs are stable. However having new headaches (severe, 10 days out of last 1 month, eyes, no nausea/vomiting, positive phonophobia). Never had these kind of headaches before. Headaches are better in last few days.   UPDATE 03/23/15: Since last visit, doing well. Back on rebif. Ready to try PT evaluation again. Tolerating meds.   UPDATE 09/16/14: Since last visit, completed breast CA treatments (adriamycin, cytoxan, taxol; couldn't complete the taxol cycles). Now ready for restarting MS treatments. No focal flares of MS, but had generalized slowness, fatigue, muscle spasms.   UPDATE 02/13/14: Since last visit, has been diagnosed with triple negative breast CA, s/p right mastectomy. Now planning to have chemotherapy. No new neuro symptoms. Tolerating rebif.   UPDATE 11/13/13: Since last visit, doing well. Getting wrap / lymphedema therapy via Amy (OT) at [facility] with great results. Significant improvement. Tolerating rebif. No new lesions or symptoms.  UPDATE 08/14/13: Since last visit, has continued on rebif. BLE edema still a problem. Saw cardiology, tried diuretic, and also trying wrap/massage therapy next week. Walking and mobility gradually worsening.   UPDATE 02/13/13 (LL): The patient returns for followup visit. Since last visit she states that she has seen Dr. Katy Fitch and that she needs new prescription eyeglasses for vision problems that the double vision resolved. She is tolerating rebif. Her biggest concern is that her  bilateral leg edema is making it difficult for her to move her legs or ambulate. She is not consistently wearing her TED hose. She denies any shortness of breath, but is mostly wheelchair-bound. She states that she feels like she is progressively getting weaker. She asked for information about Tecfidera. She said she had a cardiology consultation in the past when she started Gilenya, but that Dr. has since retired. She states she was diagnosed with aortic insufficiency. Her urgency and frequency of urination is getting worse, she asked if she can be increased on her medication.   UPDATE 11/07/12: Since last visit, doing well, until 1 month ago and developed fluctuating double vision/blurred vision. Affects right greater than left eye. It is present even if she closes the right or left eyes.   UPDATE 04/20/12: Since last visit, has come off gabapentin. BLE swelling stable. S/p ACDF surg per Dr. Vertell Limber and doing well. Tolerating rebif. Has new PCP and also getting PT. Few days ago, developed some numbness in right hand, similar to pre-op symptoms which improved post-op. No neck pain.   UPDATE 01/10/12: Noticed worsening swelling to bilateral lower extremities since 01/04/12. She is also having throbbing pain to shins. Difficulty with wearing sneakers. Unable to use leg compression machine. Denies shortness of breath.   UPDATE 10/27/11: Having surgery on c-spine fusion on November 17, 2011. Concerned about recovery time and her ability to perform ADL's. Lives with aunt. Wanting to know if she needs to have someone come to home or stay at rehab. Intermittent blurred vision las 3 weeks.   UPDATE 07/27/11: Doing better with shingles and post-herpetic neuralgia pain; on  gralise. Now with worsening RLE weakness, fatigue, vision changes, speech diff. Discussed tx options.   UPDATE 06/02/11: On 05/27/11 developed right lower tooth pain; 4/20 developed blisters and rash on right chin and jaw. 4/22 went to dentist, diagnosed  with tooth infx and given amoxicillin. Rash more significant. 06/01/11, went to dermatologist and dx'd possible shingles. Given valtrex and started on 4/24. Lesions still oozing. Has right ear and right facial pain, severe. No eye pain.   UPDATE 03/02/10: Patient was getting worked up for first dose observation for Gilenya, however on 02/28/2011, patient had a suspected seizure. She was in the bathroom at home, stood up and without warning have loss of consciousness. Her mother found her on the floor groaning with some twitching movements of her upper extremities, and her legs outstretched in front of her. No incontinence or tongue biting. It took approximately 20-30 minutes for her to wake up. She was amnestic events until the next day.  In retrospect, patient had 4 or 5 episodes of syncope versus seizure in the early 1980s. She was never on antiseizure medication. Patient was also on Ampyra since past 2 years.   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 Last dose tysabri on 10/29/10. She would like to switch to [place]. No MS flares. Some increased fatigue. Also asking if amantadine can be stopped to see if peripheral edema improves.   PRIOR HPI (11/02/10): 55 year old right-handed female with history of multiple sclerosis. Here for transfer of care from [place]. 2001 - patient developed left leg numbness and weakness, followed by right facial weakness and slurred speech. She was evaluated with MRI of the brain, diagnosed with multiple sclerosis and started on Copaxone. She did not have lumbar puncture or spinal cord MRI. 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 She thinks she was off of Copaxone for approximately 2 years. 2008-9 - patient was evaluated at [facility] and started on Tysabri. She thinks she has been on Tysabri for approximately 3-1/2 years. Last dose was last week at [facility]. She continues to decline in terms of mobility. She is to  walk using a cane, then started using a walker, now uses a transport chair occasionally. Patient continues to have intermittent right facial twitching and numbness. She is on amantadine, baclofen, Amprya, and oxybutynin for MS symptoms.    REVIEW OF SYSTEMS: Full 14 system review of systems performed and negative except leg swelling murmur aching muscles restless legs.    ALLERGIES: Allergies  Allergen Reactions  . Fentanyl Nausea And Vomiting and Nausea Only    vomiting Was given in PACU x3 each time patient got sick vomiting Was given in PACU x3 each time patient got sick  . Sulfa Antibiotics Hives and Other (See Comments)    Light headed, over heated    HOME MEDICATIONS: Outpatient Prescriptions Prior to Visit  Medication Sig Dispense Refill  . amantadine (SYMMETREL) 100 MG capsule TAKE 3 CAPSULES BY MOUTH EVERY DAY 270 capsule 0  . baclofen (LIORESAL) 10 MG tablet Take 1-2 tablets (10-20 mg total) by mouth 3 (three) times daily. 150 tablet 12  . Cholecalciferol (VITAMIN D3) 2000 UNITS capsule Take 2,000 Units by mouth daily.    . Cranberry 1000 MG CAPS Take by mouth daily.    . fentaNYL (DURAGESIC - DOSED MCG/HR) 50 MCG/HR Place 1 patch (50 mcg total) onto the skin every 3 (three) days. 10 patch 0  . GLUCOSAMINE-CHONDROITIN PO Take 1 tablet  by mouth 3 (three) times daily. STRENGTH: 1500-1200    . ibuprofen (ADVIL,MOTRIN) 600 MG tablet Take 1 tablet (600 mg total) by mouth every 6 (six) hours as needed. 60 tablet 1  . interferon beta-1a (REBIF) 44 MCG/0.5ML SOSY injection Inject 44 mcg into the skin 3 (three) times a week.    . levETIRAcetam (KEPPRA) 500 MG tablet Take 1 tablet (500 mg total) by mouth 2 (two) times daily. 180 tablet 4  . lidocaine-prilocaine (EMLA) cream Apply 1 application topically once. Apply to port then cover with saran wrap 1-2 hours before chemotherapy    . loratadine (CLARITIN) 10 MG tablet Take 10 mg by mouth daily.    . Misc Natural Products (LEG VEIN &  CIRCULATION) TABS Take by mouth daily.    . Multiple Vitamins-Minerals (MULTIVITAMIN PO) Take 1 tablet by mouth daily.    Marland Kitchen oxybutynin (DITROPAN-XL) 10 MG 24 hr tablet TAKE 2 TABLETS BY MOUTH DAILY 180 tablet 3  . Oxycodone HCl 10 MG TABS Take 1 tablet (10 mg total) by mouth every 6 (six) hours as needed. 120 tablet 0  . prochlorperazine (COMPAZINE) 10 MG tablet Take 1 tablet (10 mg total) by mouth every 6 (six) hours as needed. 30 tablet 1   No facility-administered medications prior to visit.    PAST MEDICAL HISTORY: Past Medical History  Diagnosis Date  . Herpes zoster   . Multiple sclerosis ([place]) 2001    Walks from room to room @ home; but Wheelchair when going out.  . Closed head injury with brief loss of consciousness ([place])   . Seizures ([place])     Takes Keppra  . IBS (irritable bowel syndrome)     no longer  . Bacterial endocarditis     History of .  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  . Syncope and collapse   . Bilateral lower extremity edema     Noncardiac.  Chronic. LE Venous dopplers - negative for DVT.; Echocardiogram January 2016: Normal EF with normal wall motion and valve function. Only grade 1 diastolic dysfunction. EF 60-65%. Mild MR  . Neuromuscular disorder ([place])     MS  . Lymphedema     has legs wrapped at [facility]  . Cervical stenosis of spine   . Irritable bowel   . Breast cancer ([place]) 12-31-13    Right breast, 12:00, 1.5 cm, T1c,N0 invasive mammary carcinoma, triple negative. --> Rx with Chemo    PAST SURGICAL HISTORY: Past Surgical History  Procedure Laterality Date  . Cholecystectomy    . Ankle surgery      Left  . Port-a-cath removal      right  . Anterior cervical decomp/discectomy fusion  11/17/2011    Procedure: ANTERIOR CERVICAL DECOMPRESSION/DISCECTOMY FUSION 2 LEVELS;  Surgeon: Erline Levine, MD;  Location: [facility];  Service: Neurosurgery;  Laterality: N/A;  Cervical Five-Six  Six-Seven Anterior cervical decompression/diskectomy/fusion  . Transthoracic echocardiogram  03/2013; 02/2014    a) Normal LV size and function with EF 60-65%.; Cannot exclude bicuspid aortic valve with mild AS and mild AI.; b) Normal EF with normal wall motion and valve function x Mild MR. G2 DD. EF 60-65%. Tricuspid AoV  . Lower extremity venous dopplers  Feb 27, 2013    No LE DVT  . Port a cath insertion Right 01/19/2010  . Colonoscopy  2014  . Upper gi endoscopy  2014  . Port-a-cath removal  Right 09/03/2013    Procedure: REMOVAL PORT-A-CATH;  Surgeon: Conrad [place], MD;  Location: [facility];  Service: Vascular;  Laterality: Right;  . Breast biopsy Right 12-31-13    invasive mammary  . Breast surgery Right 02/03/2014    Right simple mastectomy with sentinel node biopsy.  . Mastectomy    . Ankle surgery      FAMILY HISTORY: Family History  Problem Relation Age of Onset  . Cancer Father     skin  . Heart disease Father   . Heart attack Father   . Breast cancer Maternal Aunt 60  . Breast cancer Maternal Grandmother 74    SOCIAL HISTORY:  Social History   Social History  . Marital Status: Married    Spouse Name: don  . Number of Children: 0  . Years of Education: 12   Occupational History  . disability    Social History Main Topics  . Smoking status: Never Smoker   . Smokeless tobacco: Never Used  . Alcohol Use: No  . Drug Use: No  . Sexual Activity:    Partners: Male   Other Topics Concern  . Not on file   Social History Narrative   She is married. Recently moved back to [place] after being in [place] for some time. She is accompanied by her husband and aunt.   Never smoked. Never used alcohol.     PHYSICAL EXAM  Filed Vitals:   08/06/15 1516  BP: 104/65  Pulse: 76  Weight: 177 lb (80.287 kg)    Not recorded      Body mass index is 30.37 kg/(m^2).  GENERAL EXAM:  General: Patient is awake, alert and in no acute distress. Well developed and  groomed.  Neck: Neck is supple.  Cardiovascular: Heart is regular rate and rhythm with mild systolic murmur. PERIPHERAL EDEMA IN BLE.   Neurologic Exam  Mental Status: Awake, alert. Language is fluent and comprehension intact. Cranial Nerves: Pupils are equal and reactive to light. Visual fields are full to confrontation. Conjugate eye movements are full and symmetric --> SACCADIC BREAKDOWN OF SMOOTH PURSUIT. Facial sensation and strength are symmetric; EXCEPT LEFT NL FOLD SLIGHTLY DECR. Hearing is intact. Palate elevated symmetrically and uvula is midline. Shoulder shrug is symmetric. Tongue is midline.  Motor: Normal bulk and tone. Full strength in the upper extremities; BLE (1-2/5 prox, 3/5 distal), INCREASED TONE IN BLE. No pronator drift.  Sensory: Intact and symmetric to light touch.  Coordination: FTN SMOOTH Reflexes: Deep tendon reflexes in the upper and lower extremity are present and symmetric; TRACE AT KNEES AND ANKLES. PERIPHERAL EDEMA. Gait and Station: [place].     DIAGNOSTIC DATA (LABS, IMAGING, TESTING) - I reviewed patient records, labs, notes, testing and imaging myself where available.  Lab Results  Component Value Date   WBC 3.3* 07/17/2014   HGB 11.5* 07/17/2014   HCT 35.0 07/17/2014   MCV 92.7 07/17/2014   PLT 130* 07/17/2014      Component Value Date/Time   NA 136 07/17/2014 0941   NA 139 06/05/2014 0951   NA 141 01/09/2014 0930   K 3.6 07/17/2014 0941   K 3.8 06/05/2014 0951   CL 104 07/17/2014 0941   CL 107 06/05/2014 0951   CO2 28 07/17/2014 0941   CO2 28 06/05/2014 0951   GLUCOSE 93 07/17/2014 0941   GLUCOSE 130* 06/05/2014 0951   GLUCOSE 86 01/09/2014 0930   BUN 21* 07/17/2014 0941   BUN 12 06/05/2014 0951  BUN 20 01/09/2014 0930   CREATININE 0.46 07/17/2014 0941   CREATININE 0.56 06/05/2014 0951   CALCIUM 9.1 07/17/2014 0941   CALCIUM 9.4 06/05/2014 0951   PROT 7.4 07/17/2014 0941   PROT 7.0 05/29/2014 0949   PROT 6.9 01/09/2014  0930   ALBUMIN 4.5 07/17/2014 0941   ALBUMIN 4.1 05/29/2014 0949   ALBUMIN 4.3 01/09/2014 0930   AST 19 07/17/2014 0941   AST 24 05/29/2014 0949   ALT 16 07/17/2014 0941   ALT 24 05/29/2014 0949   ALKPHOS 73 07/17/2014 0941   ALKPHOS 58 05/29/2014 0949   BILITOT 0.5 07/17/2014 0941   BILITOT 0.6 05/29/2014 0949   GFRNONAA >60 07/17/2014 0941   GFRNONAA >60 06/05/2014 0951   GFRNONAA >60 02/28/2011 1331   GFRAA >60 07/17/2014 0941   GFRAA >60 06/05/2014 0951   GFRAA >60 02/28/2011 1331   No results found for: CHOL No results found for: HGBA1C No results found for: VITAMINB12 No results found for: TSH  I reviewed images myself and agree with interpretation. -VRP  08/21/13 MRI brain (with and without) demonstrating:  1. Multiple periventricular and subcortical chronic demyelinating plaques. Some of these are confluent. Some are hypointense on T1.  2. No acute plaques.  3. No significant change from MRI on 11/20/12.   08/21/13 MRI cervical spine (with and without) demonstrating:  1. At C2-3: disc bulging and facet hypertrophy with mild spinal stenosis and mild left foraminal stenosis.  2. At C5-6: uncovertebral joint hypertrophy with mild right foraminal stenosis.  3. Multiple chronic demyelinating plaques from C2 to T1. No abnormal enhancing lesions.  4. Compared to MRI from 10/04/11, there has been progression of degenerative spine disease at C2-3. Also there has been interval ACDF from C5-C7. No significant change in spinal cord lesions.  08/21/13 MRI thoracic spine (with and without) demonstrating:  1. Multiple spinal cord chronic demyelinating plaques from C7 to T12-L1.  2. No acute plaques.  3. ACDF at C5-C7.  4. Compared to prior MRI from 08/03/11, the ACDF is new. Overall no new demyelinating plaques.  09/30/14 MRI brain  - showing 2/flare foci in the right cervical hemisphere, pons, thalami and cerebral hemispheres in a pattern and configuration consistent with the  diagnosis of multiple sclerosis. None of the foci enhances after contrast administration. There are no acute findings. When compared to the MRI dated 08/21/2013, there has been no interval change.    ASSESSMENT AND PLAN  55 y.o. year old female here with multiple sclerosis, initially on copaxone, then was on Tysabri in the past (3-1/2 years); then became JCV +. Last dose tysabri 10/29/10. Switched to [place] on 04/25/11.   Had brief seizure before starting gilenya, now on levetiracetam.   Then with right V2, V3 herpes zoster on 05/27/11. Gilenya stopped. Shingles resolved.  Then on rebif. Also s/p ACDF C5-6 (Oct 2013) for spinal stenosis.   Now with breast CA diagnosis, s/p right mastectomy, s/p adriamycin, cytoxan, taxol. Now treatments have been completed.   Now back on rebif (since Jan 2017).  Now with new onset headaches (June 2017). Needs MRI brain to rule out secondary causes.   Dx:  Multiple sclerosis ([place])  Malignant neoplasm of right female breast, unspecified site of breast ([place])  Seizure disorder ([place])  Other fatigue  Muscle spasm     PLAN:  1. Continue rebif 2. Continue baclofen for muscle spasms (stable) 3. Continue levetiracetam for seizure d/o (stable) 4. Continue amantadine for fatigue (stable) 5. Home Health PT  evaluation 6. Check MRI brain (new onset HA)  Orders Placed This Encounter  Procedures  . MR Brain W Wo Contrast  . CBC with Differential/Platelet  . Comprehensive metabolic panel  . VITAMIN D 25 Hydroxy (Vit-D Deficiency, Fractures)  . Ambulatory referral to [place] ordered this encounter  Medications  . amantadine (SYMMETREL) 100 MG capsule    Sig: Take 3 capsules (300 mg total) by mouth daily.    Dispense:  270 capsule    Refill:  4  . baclofen (LIORESAL) 10 MG tablet    Sig: Take 1-2 tablets (10-20 mg total) by mouth 3 (three) times daily.    Dispense:  150 tablet    Refill:  12  . levETIRAcetam (KEPPRA) 500 MG tablet     Sig: Take 1 tablet (500 mg total) by mouth 2 (two) times daily.    Dispense:  180 tablet    Refill:  4   Return in about 3 months (around 11/06/2015).    Penni Bombard, MD [id number], [id number] PM Certified in Neurology, Neurophysiology and Neuroimaging  [facility] [address] [place],  [address] [phone]

## 2015-08-07 LAB — COMPREHENSIVE METABOLIC PANEL
A/G RATIO: 2.4 — AB (ref 1.2–2.2)
ALT: 23 IU/L (ref 0–32)
AST: 24 IU/L (ref 0–40)
Albumin: 4.6 g/dL (ref 3.5–5.5)
Alkaline Phosphatase: 76 IU/L (ref 39–117)
BUN/Creatinine Ratio: 31 — ABNORMAL HIGH (ref 9–23)
BUN: 16 mg/dL (ref 6–24)
Bilirubin Total: 0.2 mg/dL (ref 0.0–1.2)
CALCIUM: 9.1 mg/dL (ref 8.7–10.2)
CHLORIDE: 102 mmol/L (ref 96–106)
CO2: 35 mmol/L — ABNORMAL HIGH (ref 18–29)
Creatinine, Ser: 0.51 mg/dL — ABNORMAL LOW (ref 0.57–1.00)
GFR calc Af Amer: 125 mL/min/{1.73_m2} (ref >59)
GFR calc non Af Amer: 109 mL/min/{1.73_m2} (ref >59)
GLOBULIN, TOTAL: 1.9 g/dL (ref 1.5–4.5)
Glucose: 101 mg/dL — ABNORMAL HIGH (ref 65–99)
Potassium: 4.1 mmol/L (ref 3.5–5.2)
Sodium: 142 mmol/L (ref 134–144)
TOTAL PROTEIN: 6.5 g/dL (ref 6.0–8.5)

## 2015-08-07 LAB — CBC WITH DIFFERENTIAL/PLATELET
BASOS: 0 %
Basophils Absolute: 0 10*3/uL (ref 0.0–0.2)
EOS (ABSOLUTE): 0.1 10*3/uL (ref 0.0–0.4)
EOS: 3 %
HEMATOCRIT: 32.5 % — AB (ref 34.0–46.6)
HEMOGLOBIN: 10.8 g/dL — AB (ref 11.1–15.9)
IMMATURE GRANS (ABS): 0 10*3/uL (ref 0.0–0.1)
IMMATURE GRANULOCYTES: 0 %
LYMPHS: 29 %
Lymphocytes Absolute: 1 10*3/uL (ref 0.7–3.1)
MCH: 29.8 pg (ref 26.6–33.0)
MCHC: 33.2 g/dL (ref 31.5–35.7)
MCV: 90 fL (ref 79–97)
MONOCYTES: 7 %
Monocytes Absolute: 0.3 10*3/uL (ref 0.1–0.9)
NEUTROS PCT: 61 %
Neutrophils Absolute: 2.2 10*3/uL (ref 1.4–7.0)
Platelets: 123 10*3/uL — ABNORMAL LOW (ref 150–379)
RBC: 3.62 x10E6/uL — ABNORMAL LOW (ref 3.77–5.28)
RDW: 13.3 % (ref 12.3–15.4)
WBC: 3.6 10*3/uL (ref 3.4–10.8)

## 2015-08-07 LAB — VITAMIN D 25 HYDROXY (VIT D DEFICIENCY, FRACTURES): VIT D 25 HYDROXY: 43.1 ng/mL (ref 30.0–100.0)

## 2015-08-08 DIAGNOSIS — M4802 Spinal stenosis, cervical region: Secondary | ICD-10-CM | POA: Diagnosis not present

## 2015-08-08 DIAGNOSIS — G35 Multiple sclerosis: Secondary | ICD-10-CM | POA: Diagnosis not present

## 2015-08-08 DIAGNOSIS — Z9181 History of falling: Secondary | ICD-10-CM | POA: Diagnosis not present

## 2015-08-08 DIAGNOSIS — G40909 Epilepsy, unspecified, not intractable, without status epilepticus: Secondary | ICD-10-CM | POA: Diagnosis not present

## 2015-08-08 DIAGNOSIS — I890 Lymphedema, not elsewhere classified: Secondary | ICD-10-CM | POA: Diagnosis not present

## 2015-08-08 DIAGNOSIS — C50911 Malignant neoplasm of unspecified site of right female breast: Secondary | ICD-10-CM | POA: Diagnosis not present

## 2015-08-08 DIAGNOSIS — K589 Irritable bowel syndrome without diarrhea: Secondary | ICD-10-CM | POA: Diagnosis not present

## 2015-08-09 NOTE — Progress Notes (Signed)
[place]  [phone] [phone]  ID: Rebecca [name] OB: 11/27/1960  MR#: [id number]  NTI#:[id number]  Patient Care Team: Bobetta Lime, MD as PCP - General Bobetta Lime, MD as Referring Physician [name], MD (General Surgery)  CHIEF COMPLAINT:  Chief Complaint  Patient presents with  . Breast Cancer    INTERVAL HISTORY: Patient returns to clinic today for routine 3 month evaluation. She currently feels well and is asymptomatic. Her pain is currently well-controlled. Patient states her peripheral neuropathy continues to improve. Her lower extremity edema is also significantly improved. She does not report headache today. She denies any chest pain or shortness of breath. She denies any nausea, vomiting, constipation, or diarrhea. She has no urinary complaints. Patient offers no further specific complaints.   REVIEW OF SYSTEMS:   Review of Systems  Constitutional: Negative for fever and malaise/fatigue.  Cardiovascular: Positive for leg swelling.  Gastrointestinal: Negative.   Musculoskeletal: Positive for joint pain.  Neurological: Positive for sensory change. Negative for weakness.  Psychiatric/Behavioral: Negative.     As per HPI. Otherwise, a complete review of systems is negatve.  PAST MEDICAL HISTORY: Past Medical History  Diagnosis Date  . Herpes zoster   . Multiple sclerosis ([place]) 2001    Walks from room to room @ home; but Wheelchair when going out.  . Closed head injury with brief loss of consciousness ([place])   . Seizures ([place])     Takes Keppra  . IBS (irritable bowel syndrome)     no longer  . Bacterial endocarditis     History of .  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  . Syncope and collapse   . Bilateral lower extremity edema     Noncardiac.  Chronic. LE Venous dopplers - negative for DVT.; Echocardiogram January 2016: Normal EF with normal wall  motion and valve function. Only grade 1 diastolic dysfunction. EF 60-65%. Mild MR  . Neuromuscular disorder ([place])     MS  . Lymphedema     has legs wrapped at [facility]  . Cervical stenosis of spine   . Irritable bowel   . Breast cancer ([place]) 12-31-13    Right breast, 12:00, 1.5 cm, T1c,N0 invasive mammary carcinoma, triple negative. --> Rx with Chemo    PAST SURGICAL HISTORY: Past Surgical History  Procedure Laterality Date  . Cholecystectomy    . Ankle surgery      Left  . Port-a-cath removal      right  . Anterior cervical decomp/discectomy fusion  11/17/2011    Procedure: ANTERIOR CERVICAL DECOMPRESSION/DISCECTOMY FUSION 2 LEVELS;  Surgeon: Erline Levine, MD;  Location: [facility];  Service: Neurosurgery;  Laterality: N/A;  Cervical Five-Six Six-Seven Anterior cervical decompression/diskectomy/fusion  . Transthoracic echocardiogram  03/2013; 02/2014    a) Normal LV size and function with EF 60-65%.; Cannot exclude bicuspid aortic valve with mild AS and mild AI.; b) Normal EF with normal wall motion and valve function x Mild MR. G2 DD. EF 60-65%. Tricuspid AoV  . Lower extremity venous dopplers  Feb 27, 2013    No LE DVT  . Port a cath insertion Right 01/19/2010  . Colonoscopy  2014  . Upper gi endoscopy  2014  . Port-a-cath removal Right 09/03/2013    Procedure: REMOVAL PORT-A-CATH;  Surgeon: Conrad [place]ville, MD;  Location: [facility];  Service: Vascular;  Laterality: Right;  . Breast biopsy Right  12-31-13    invasive mammary  . Breast surgery Right 02/03/2014    Right simple mastectomy with sentinel node biopsy.  . Mastectomy    . Ankle surgery      FAMILY HISTORY Family History  Problem Relation Age of Onset  . Cancer Father     skin  . Heart disease Father   . Heart attack Father   . Breast cancer Maternal Aunt 60  . Breast cancer Maternal Grandmother 74       ADVANCED DIRECTIVES:    HEALTH MAINTENANCE: Social History  Substance Use Topics  . Smoking status: Never  Smoker   . Smokeless tobacco: Never Used  . Alcohol Use: No     Colonoscopy:  PAP:  Bone density:  Lipid panel:  Allergies  Allergen Reactions  . Fentanyl Nausea And Vomiting and Nausea Only    vomiting Was given in PACU x3 each time patient got sick vomiting Was given in PACU x3 each time patient got sick  . Sulfa Antibiotics Hives and Other (See Comments)    Light headed, over heated    Current Outpatient Prescriptions  Medication Sig Dispense Refill  . Cholecalciferol (VITAMIN D3) 2000 UNITS capsule Take 2,000 Units by mouth daily.    . Cranberry 1000 MG CAPS Take by mouth daily.    . fentaNYL (DURAGESIC - DOSED MCG/HR) 50 MCG/HR Place 1 patch (50 mcg total) onto the skin every 3 (three) days. 10 patch 0  . GLUCOSAMINE-CHONDROITIN PO Take 1 tablet by mouth 3 (three) times daily. STRENGTH: 1500-1200    . ibuprofen (ADVIL,MOTRIN) 600 MG tablet Take 1 tablet (600 mg total) by mouth every 6 (six) hours as needed. 60 tablet 1  . interferon beta-1a (REBIF) 44 MCG/0.5ML SOSY injection Inject 44 mcg into the skin 3 (three) times a week.    . lidocaine-prilocaine (EMLA) cream Apply 1 application topically once. Apply to port then cover with saran wrap 1-2 hours before chemotherapy    . loratadine (CLARITIN) 10 MG tablet Take 10 mg by mouth daily.    . Misc Natural Products (LEG VEIN & CIRCULATION) TABS Take by mouth daily.    . Multiple Vitamins-Minerals (MULTIVITAMIN PO) Take 1 tablet by mouth daily.    Marland Kitchen oxybutynin (DITROPAN-XL) 10 MG 24 hr tablet TAKE 2 TABLETS BY MOUTH DAILY 180 tablet 3  . Oxycodone HCl 10 MG TABS Take 1 tablet (10 mg total) by mouth every 6 (six) hours as needed. 120 tablet 0  . prochlorperazine (COMPAZINE) 10 MG tablet Take 1 tablet (10 mg total) by mouth every 6 (six) hours as needed. 30 tablet 1  . amantadine (SYMMETREL) 100 MG capsule Take 3 capsules (300 mg total) by mouth daily. 270 capsule 4  . baclofen (LIORESAL) 10 MG tablet Take 1-2 tablets (10-20 mg  total) by mouth 3 (three) times daily. 150 tablet 12  . levETIRAcetam (KEPPRA) 500 MG tablet Take 1 tablet (500 mg total) by mouth 2 (two) times daily. 180 tablet 4   No current facility-administered medications for this visit.    OBJECTIVE: Filed Vitals:   08/05/15 0952  BP: 133/78  Pulse: 73  Temp: 97.1 F (36.2 C)  Resp: 18     Body mass index is 29.16 kg/(m^2).    ECOG FS:1 - Symptomatic but completely ambulatory  General: Well-developed, well-nourished, no acute distress. Sitting in a wheelchair. Eyes: anicteric sclera. Breasts: Patient requested exam be deferred today. Lungs: Clear to auscultation bilaterally. Heart: Regular rate and rhythm. No rubs,  murmurs, or gallops. Abdomen: Soft, nontender, nondistended. No organomegaly noted, normoactive bowel sounds. Musculoskeletal: Bilateral lower extremity lymphedema. Neuro: Alert, answering all questions appropriately. Cranial nerves grossly intact. Skin: No rashes or petechiae noted. Psych: Normal affect.   LAB RESULTS:  Lab Results  Component Value Date   NA 142 08/06/2015   K 4.1 08/06/2015   CL 102 08/06/2015   CO2 35* 08/06/2015   GLUCOSE 101* 08/06/2015   BUN 16 08/06/2015   CREATININE 0.51* 08/06/2015   CALCIUM 9.1 08/06/2015   PROT 6.5 08/06/2015   ALBUMIN 4.6 08/06/2015   AST 24 08/06/2015   ALT 23 08/06/2015   ALKPHOS 76 08/06/2015   BILITOT 0.2 08/06/2015   GFRNONAA 109 08/06/2015   GFRAA 125 08/06/2015    Lab Results  Component Value Date   WBC 3.6 08/06/2015   NEUTROABS 2.2 08/06/2015   HGB 11.5* 07/17/2014   HCT 32.5* 08/06/2015   MCV 90 08/06/2015   PLT 123* 08/06/2015   Lab Results  Component Value Date   LABCA2 18.5 08/05/2015     STUDIES: No results found.  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 BRCA negative.  PLAN:    1. Triple negative right breast cancer: No evidence of disease. CA-27-29 continues to be within normal limits. Because  patient had a full mastectomy, she did not require adjuvant XRT. Given the fact that she is a triple negative cancer, she will not require an aromatase inhibitor. Given her difficulties with Taxol and multiple sclerosis, treatment was discontinued after a total of 6 of 12 cycles of Taxol. Her last chemotherapy was July 17, 2014. Mammogram of her left breast on March 11, 2015 was reported as BIRADS 1, repeat in one year.  Return to clinic in 4 months for laboratory work and further evaluation.  2. Multiple sclerosis: Continue evaluation and current treatment per neurology. 3. Lymphedema: Continue treatment and wraps as prescribed. 4. Peripheral neuropathy: Improved. Monitor. 5. Pain: Continue fentanyl patch to 50 g and 10 mg oxycodone as needed for breakthrough pain.   Her primary neurologist is Dr. Andrey Spearman.  phone [phone], fax [phone].   Patient expressed understanding and was in agreement with this plan. She also understands that She can call clinic at any time with any questions, concerns, or complaints.   Breast cancer   Staging form: Breast, AJCC 7th Edition     Pathologic stage from 05/24/2014: Stage IA (T1c, N0, cM0) - Signed by Lloyd Huger, MD on 05/24/2014   Lloyd Huger, MD   08/09/2015 2:39 PM

## 2015-08-12 ENCOUNTER — Telehealth: Payer: Self-pay | Admitting: *Deleted

## 2015-08-12 NOTE — Telephone Encounter (Signed)
Per Dr Leta Baptist, spoke with patient and informed her that her lab results are unremarkable. Her anemia and low platelets are chronic, stable. Dr Leta Baptist will continue with his current plan. She verbalized understanding, appreciation.

## 2015-08-13 DIAGNOSIS — K589 Irritable bowel syndrome without diarrhea: Secondary | ICD-10-CM | POA: Diagnosis not present

## 2015-08-13 DIAGNOSIS — I890 Lymphedema, not elsewhere classified: Secondary | ICD-10-CM | POA: Diagnosis not present

## 2015-08-13 DIAGNOSIS — C50911 Malignant neoplasm of unspecified site of right female breast: Secondary | ICD-10-CM | POA: Diagnosis not present

## 2015-08-13 DIAGNOSIS — G35 Multiple sclerosis: Secondary | ICD-10-CM | POA: Diagnosis not present

## 2015-08-13 DIAGNOSIS — M4802 Spinal stenosis, cervical region: Secondary | ICD-10-CM | POA: Diagnosis not present

## 2015-08-13 DIAGNOSIS — G40909 Epilepsy, unspecified, not intractable, without status epilepticus: Secondary | ICD-10-CM | POA: Diagnosis not present

## 2015-08-18 DIAGNOSIS — K589 Irritable bowel syndrome without diarrhea: Secondary | ICD-10-CM | POA: Diagnosis not present

## 2015-08-18 DIAGNOSIS — I890 Lymphedema, not elsewhere classified: Secondary | ICD-10-CM | POA: Diagnosis not present

## 2015-08-18 DIAGNOSIS — M4802 Spinal stenosis, cervical region: Secondary | ICD-10-CM | POA: Diagnosis not present

## 2015-08-18 DIAGNOSIS — G35 Multiple sclerosis: Secondary | ICD-10-CM | POA: Diagnosis not present

## 2015-08-18 DIAGNOSIS — G40909 Epilepsy, unspecified, not intractable, without status epilepticus: Secondary | ICD-10-CM | POA: Diagnosis not present

## 2015-08-18 DIAGNOSIS — C50911 Malignant neoplasm of unspecified site of right female breast: Secondary | ICD-10-CM | POA: Diagnosis not present

## 2015-08-19 ENCOUNTER — Telehealth: Payer: Self-pay

## 2015-08-19 DIAGNOSIS — C50919 Malignant neoplasm of unspecified site of unspecified female breast: Secondary | ICD-10-CM

## 2015-08-19 MED ORDER — OXYCODONE HCL 10 MG PO TABS: 10.0000 mg | ORAL_TABLET | Freq: Four times a day (QID) | ORAL | Status: DC | PRN

## 2015-08-19 NOTE — Telephone Encounter (Signed)
Patient called to request Oxycodone refill.  Prescription prepared and patient notified it is ready for pick up at the front desk.

## 2015-08-20 DIAGNOSIS — C50911 Malignant neoplasm of unspecified site of right female breast: Secondary | ICD-10-CM | POA: Diagnosis not present

## 2015-08-20 DIAGNOSIS — I890 Lymphedema, not elsewhere classified: Secondary | ICD-10-CM | POA: Diagnosis not present

## 2015-08-20 DIAGNOSIS — M4802 Spinal stenosis, cervical region: Secondary | ICD-10-CM | POA: Diagnosis not present

## 2015-08-20 DIAGNOSIS — K589 Irritable bowel syndrome without diarrhea: Secondary | ICD-10-CM | POA: Diagnosis not present

## 2015-08-20 DIAGNOSIS — G40909 Epilepsy, unspecified, not intractable, without status epilepticus: Secondary | ICD-10-CM | POA: Diagnosis not present

## 2015-08-20 DIAGNOSIS — G35 Multiple sclerosis: Secondary | ICD-10-CM | POA: Diagnosis not present

## 2015-08-21 ENCOUNTER — Telehealth: Payer: Self-pay | Admitting: Diagnostic Neuroimaging

## 2015-08-21 NOTE — Telephone Encounter (Signed)
Pt called requesting MRI order be sent to [facility]. Please call and advise [phone]

## 2015-08-21 NOTE — Telephone Encounter (Signed)
I called the patient and asked if she was ok with [facility] and she stated that she was. Gave their phone number to her and she was going to call and schedule.

## 2015-08-24 ENCOUNTER — Other Ambulatory Visit: Payer: Self-pay | Admitting: *Deleted

## 2015-08-24 DIAGNOSIS — K589 Irritable bowel syndrome without diarrhea: Secondary | ICD-10-CM | POA: Diagnosis not present

## 2015-08-24 DIAGNOSIS — I890 Lymphedema, not elsewhere classified: Secondary | ICD-10-CM | POA: Diagnosis not present

## 2015-08-24 DIAGNOSIS — G35 Multiple sclerosis: Secondary | ICD-10-CM | POA: Diagnosis not present

## 2015-08-24 DIAGNOSIS — M4802 Spinal stenosis, cervical region: Secondary | ICD-10-CM | POA: Diagnosis not present

## 2015-08-24 DIAGNOSIS — G40909 Epilepsy, unspecified, not intractable, without status epilepticus: Secondary | ICD-10-CM | POA: Diagnosis not present

## 2015-08-24 DIAGNOSIS — C50911 Malignant neoplasm of unspecified site of right female breast: Secondary | ICD-10-CM | POA: Diagnosis not present

## 2015-08-24 MED ORDER — PROCHLORPERAZINE MALEATE 10 MG PO TABS: 10.0000 mg | ORAL_TABLET | Freq: Four times a day (QID) | ORAL | Status: DC | PRN

## 2015-08-25 ENCOUNTER — Encounter: Payer: Self-pay | Admitting: Family Medicine

## 2015-08-25 ENCOUNTER — Other Ambulatory Visit: Payer: Self-pay | Admitting: *Deleted

## 2015-08-25 ENCOUNTER — Ambulatory Visit (INDEPENDENT_AMBULATORY_CARE_PROVIDER_SITE_OTHER): Payer: Medicare Other | Admitting: Family Medicine

## 2015-08-25 ENCOUNTER — Telehealth: Payer: Self-pay | Admitting: Family Medicine

## 2015-08-25 VITALS — BP 124/76 | HR 73 | Temp 98.0°F | Resp 14

## 2015-08-25 DIAGNOSIS — D649 Anemia, unspecified: Secondary | ICD-10-CM

## 2015-08-25 DIAGNOSIS — R600 Localized edema: Secondary | ICD-10-CM

## 2015-08-25 DIAGNOSIS — D696 Thrombocytopenia, unspecified: Secondary | ICD-10-CM | POA: Diagnosis not present

## 2015-08-25 DIAGNOSIS — Z853 Personal history of malignant neoplasm of breast: Secondary | ICD-10-CM | POA: Diagnosis not present

## 2015-08-25 DIAGNOSIS — G40209 Localization-related (focal) (partial) symptomatic epilepsy and epileptic syndromes with complex partial seizures, not intractable, without status epilepticus: Secondary | ICD-10-CM

## 2015-08-25 DIAGNOSIS — J3089 Other allergic rhinitis: Secondary | ICD-10-CM | POA: Diagnosis not present

## 2015-08-25 DIAGNOSIS — K589 Irritable bowel syndrome without diarrhea: Secondary | ICD-10-CM

## 2015-08-25 DIAGNOSIS — Z0100 Encounter for examination of eyes and vision without abnormal findings: Secondary | ICD-10-CM | POA: Insufficient documentation

## 2015-08-25 DIAGNOSIS — G35 Multiple sclerosis: Secondary | ICD-10-CM

## 2015-08-25 MED ORDER — IBUPROFEN 600 MG PO TABS: 600.0000 mg | ORAL_TABLET | Freq: Four times a day (QID) | ORAL | Status: DC | PRN

## 2015-08-25 MED ORDER — FENTANYL 50 MCG/HR TD PT72: 50.0000 ug | MEDICATED_PATCH | TRANSDERMAL | Status: DC

## 2015-08-25 NOTE — Telephone Encounter (Signed)
Please let patient know that it's okay for her to take the ibuprofen, and I sent in a refill to her local pharmacy Thank you

## 2015-08-25 NOTE — Progress Notes (Signed)
BP 124/76   Pulse 73   Temp 98 F (36.7 C) (Oral)   Resp 14   SpO2 95%    Subjective:    Patient ID: Rebecca [name], female    DOB: [birth year], 55 y.o.   MRN: [id number]  HPI: Rebecca [name] is a 55 y.o. female  Chief Complaint  Patient presents with  . Medication Refill   She needs refill of ibuprofen; she uses it with her pain medicine; we reviewed her platelet count;  Lab Results  Component Value Date   WBC 3.6 08/06/2015   HGB 11.5 (L) 07/17/2014   HCT 32.5 (L) 08/06/2015   MCV 90 08/06/2015   PLT 123 (L) 08/06/2015  they wouldn't let her take aleve because she is on ibuprofen She did have a colonoscopy, 10 year pass; no abd pain, no ulcer or gastritis; no blood in the stool  Was taking gelinia, new medicine for MS, and got shingles on the right side of face; had to stop; seeing neurologist; diagnosed in 2001  Hx of breast cancer, surgery and chemo, no radiation; no problems; sees Dr. 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  EXAM: DIGITAL SCREENING UNILATERAL LEFT MAMMOGRAM WITH TOMO AND CAD  COMPARISON: Previous exam(s).  ACR Breast Density Category b: There are scattered areas of fibroglandular density.  FINDINGS: The patient has had a right mastectomy. There are no findings suspicious for malignancy.  Images were processed with CAD.  IMPRESSION: No mammographic evidence of malignancy. A result letter of this screening mammogram will be mailed directly to the patient.  RECOMMENDATION: Screening mammogram in one year. (Code:SM-L-69M)  BI-RADS CATEGORY 1: Negative.   Electronically Signed  By: Ammie Ferrier M.D.  On: 03/12/2015 08:04  Seizures, followed by Dr. Tish Frederickson; first was in 2012, started on Keppra, tried to ease off but got dizzy again, so not stopping; last seizure was 2012  Hx of IBS; behaving itself  Hx  of bacterial endocarditis; a few years ago; just had check up not long ago, now going back yearly  Allergies; takes claritin daily, avoiding decongestants  Lymphedema in both legs; has compression stockings; also has boots that she wears; boots are at night, stockings during the day; she has had this "forever"  She needs an eye exam referral  Depression screen [facility] 2/9 08/25/2015 10/24/2014 06/09/2011  Decreased Interest 0 0 0  Down, Depressed, Hopeless 0 0 0  PHQ - 2 Score 0 0 0   Relevant past medical, surgical, family and social history reviewed Past Medical History:  Diagnosis Date  . Aortic valve disease    Mild AS / AI - most recent Echo demonstrated tricuspid aortic valve.  . Bacterial endocarditis    History of .  Marland Kitchen Bilateral lower extremity edema    Noncardiac.  Chronic. LE Venous dopplers - negative for DVT.; Echocardiogram January 2016: Normal EF with normal wall motion and valve function. Only grade 1 diastolic dysfunction. EF 60-65%. Mild MR  . Breast cancer ([place]) 12-31-13   Right breast, 12:00, 1.5 cm, T1c,N0 invasive mammary carcinoma, triple negative. --> Rx with Chemo  . Cervical stenosis of spine   . Herpes zoster   . IBS (irritable bowel syndrome)   . Lymphedema    has legs wrapped at [facility]  . Multiple sclerosis ([place]) 2001   Walks from room to room @ home; but Wheelchair when going out.  Marland Kitchen  Neuromuscular disorder ([place])    MS  . Seizures ([place])    Takes Keppra  . Syncope and collapse    Past Surgical History:  Procedure Laterality Date  . ANKLE SURGERY     Left  . ANKLE SURGERY    . ANTERIOR CERVICAL DECOMP/DISCECTOMY FUSION  11/17/2011   Procedure: ANTERIOR CERVICAL DECOMPRESSION/DISCECTOMY FUSION 2 LEVELS;  Surgeon: Erline Levine, MD;  Location: [facility];  Service: Neurosurgery;  Laterality: N/A;  Cervical Five-Six Six-Seven Anterior cervical decompression/diskectomy/fusion  . BREAST BIOPSY Right 12-31-13   invasive mammary  . BREAST SURGERY Right  02/03/2014   Right simple mastectomy with sentinel node biopsy.  . CHOLECYSTECTOMY    . COLONOSCOPY  2014  . Lower extremity venous Dopplers  Feb 27, 2013   No LE DVT  . MASTECTOMY    . Port a cath insertion Right 01/19/2010  . PORT-A-CATH REMOVAL     right  . PORT-A-CATH REMOVAL Right 09/03/2013   Procedure: REMOVAL PORT-A-CATH;  Surgeon: Conrad [place], MD;  Location: [facility];  Service: Vascular;  Laterality: Right;  . TRANSTHORACIC ECHOCARDIOGRAM  03/2013; 02/2014   a) Normal LV size and function with EF 60-65%.; Cannot exclude bicuspid aortic valve with mild AS and mild AI.; b) Normal EF with normal wall motion and valve function x Mild MR. G2 DD. EF 60-65%. Tricuspid AoV  . UPPER GI ENDOSCOPY  2014   Family History  Problem Relation Age of Onset  . Cancer Father     skin  . Heart disease Father   . Heart attack Father     heart attack in his 56's  . Breast cancer Maternal Aunt 60  . Breast cancer Maternal Grandmother 34  . Thyroid disease Sister   . Ovarian cancer Cousin    Social History  Substance Use Topics  . Smoking status: Never Smoker  . Smokeless tobacco: Never Used  . Alcohol use No   Interim medical history since last visit reviewed. Allergies and medications reviewed  Review of Systems  Constitutional: Negative for unexpected weight change.  HENT: Negative for nosebleeds.   Respiratory: Negative for shortness of breath.   Cardiovascular: Negative for chest pain.  Gastrointestinal: Negative for blood in stool.  Genitourinary: Negative for hematuria.  Hematological: Does not bruise/bleed easily.   Per HPI unless specifically indicated above     Objective:    BP 124/76   Pulse 73   Temp 98 F (36.7 C) (Oral)   Resp 14   SpO2 95%   Wt Readings from Last 3 Encounters:  08/06/15 177 lb (80.3 kg)  08/05/15 169 lb 15.6 oz (77.1 kg)  06/17/15 183 lb (83 kg)  MD note: weights are suspect and may be not reflect actual weight; could not get on scale today;  worked out with therapist yesterday and too tired to get on scale  Physical Exam  Constitutional: She appears well-developed and well-nourished. No distress.  Seated in wheelchair; gait not assessed  Eyes: EOM are normal. No scleral icterus.  Neck: No thyromegaly present.  Cardiovascular: Normal rate.   Pulmonary/Chest: Effort normal.  Abdominal: She exhibits no distension.  Musculoskeletal: She exhibits edema (bilateral leg edema, nonpitting).  Neurological: She is alert. She displays no tremor.  Skin: No pallor.  Psychiatric: She has a normal mood and affect. Her behavior is normal. Judgment and thought content normal.   Results for orders placed or performed in visit on 08/06/15  CBC with Differential/Platelet  Result Value Ref Range  WBC 3.6 3.4 - 10.8 x10E3/uL   RBC 3.62 (L) 3.77 - 5.28 x10E6/uL   Hemoglobin 10.8 (L) 11.1 - 15.9 g/dL   Hematocrit 32.5 (L) 34.0 - 46.6 %   MCV 90 79 - 97 fL   MCH 29.8 26.6 - 33.0 pg   MCHC 33.2 31.5 - 35.7 g/dL   RDW 13.3 12.3 - 15.4 %   Platelets 123 (L) 150 - 379 x10E3/uL   Neutrophils 61 %   Lymphs 29 %   Monocytes 7 %   Eos 3 %   Basos 0 %   Neutrophils Absolute 2.2 1.4 - 7.0 x10E3/uL   Lymphocytes Absolute 1.0 0.7 - 3.1 x10E3/uL   Monocytes Absolute 0.3 0.1 - 0.9 x10E3/uL   EOS (ABSOLUTE) 0.1 0.0 - 0.4 x10E3/uL   Basophils Absolute 0.0 0.0 - 0.2 x10E3/uL   Immature Granulocytes 0 %   Immature Grans (Abs) 0.0 0.0 - 0.1 x10E3/uL  Comprehensive metabolic panel  Result Value Ref Range   Glucose 101 (H) 65 - 99 mg/dL   BUN 16 6 - 24 mg/dL   Creatinine, Ser 0.51 (L) 0.57 - 1.00 mg/dL   GFR calc non Af Amer 109 >59 mL/min/1.73   GFR calc Af Amer 125 >59 mL/min/1.73   BUN/Creatinine Ratio 31 (H) 9 - 23   Sodium 142 134 - 144 mmol/L   Potassium 4.1 3.5 - 5.2 mmol/L   Chloride 102 96 - 106 mmol/L   CO2 35 (H) 18 - 29 mmol/L   Calcium 9.1 8.7 - 10.2 mg/dL   Total Protein 6.5 6.0 - 8.5 g/dL   Albumin 4.6 3.5 - 5.5 g/dL   Globulin,  Total 1.9 1.5 - 4.5 g/dL   Albumin/Globulin Ratio 2.4 (H) 1.2 - 2.2   Bilirubin Total 0.2 0.0 - 1.2 mg/dL   Alkaline Phosphatase 76 39 - 117 IU/L   AST 24 0 - 40 IU/L   ALT 23 0 - 32 IU/L  VITAMIN D 25 Hydroxy (Vit-D Deficiency, Fractures)  Result Value Ref Range   Vit D, 25-Hydroxy 43.1 30.0 - 100.0 ng/mL      Assessment & Plan:   Problem List Items Addressed This Visit      Respiratory   Allergic rhinitis    Using plain antihistamines        Digestive   IBS (irritable bowel syndrome)    Currently stable        Nervous and Auditory   MS (multiple sclerosis) ([place])    Managed by neurologist      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[place])    Managed by neurologist        Other   Thrombocytopenia ([place]) - Primary    Managed by heme-onc; careful about NSAIDs; will pose question to him if okay to continue      History of breast cancer in female    Managed and followed up by heme-one      Encounter for eye exam   Relevant Orders   Ambulatory referral to Ophthalmology   Bilateral lower extremity edema (Chronic)    Lymphedema; last LVEF 60-65% Jan 2016 echo per notes      Anemia    Managed by heme-onc; careful about NSAIDs; will pose question to him if okay to continue       Other Visit Diagnoses   None.      Follow up plan: Return for complete physical with pap smear.  An after-visit summary was printed and given to the patient at  check-out.  Please see the patient instructions which may contain other information and recommendations beyond what is mentioned above in the assessment and plan.  Meds ordered this encounter  Medications  . DISCONTD: ibuprofen (ADVIL,MOTRIN) 600 MG tablet    Sig: Take 1 tablet (600 mg total) by mouth every 6 (six) hours as needed.    Dispense:  60 tablet    Refill:  1    Orders Placed This Encounter  Procedures  . Ambulatory referral to Ophthalmology

## 2015-08-25 NOTE — Patient Instructions (Addendum)
We'll wait to see what Dr. Grayland Ormond wants to do with the ibuprofen In the meantime, try turmeric as a natural anti-inflammatory (for pain and arthritis). It comes in capsules where you buy aspirin and fish oil, but also as a spice where you buy pepper and garlic powder. Try selenium-containing dandruff shampoo twice a week, lather up and rinse thoroughly, use for several weeks or long-term if needed If you need something for aches or pains, try to use Tylenol (acetaminophen) instead of non-steroidals (which include Aleve, ibuprofen, Advil, Motrin, and naproxen); non-steroidals can cause long-term kidney damage, gastrointestinal bleeding, ulcers, etc Please do follow-up with the neurologist about the brain MRI

## 2015-08-25 NOTE — Telephone Encounter (Signed)
-----   Message from Lloyd Huger, MD sent at 08/25/2015  1:47 PM EDT ----- Regarding: RE: Particia Nearing! Please look at CBC No worries about the platelet count.  Last i saw was 123.  Unless that drops below 100 I really don't worry too much.  Hope this helps!  Thank you!  -Tim   ----- Message -----    From: [name], MD    Sent: 08/25/2015  11:31 AM      To: Lloyd Huger, MD Subject: Particia Nearing! Please look at CBC                  I am meeting patient for the first time today. She requested a refill of ibuprofen, but I noticed her H/H and platelets are abnormal, specifically platelet count decreasing. I'm not comfortable giving her the ibuprofen Rx without your thoughts first. Thank you!

## 2015-08-27 DIAGNOSIS — K589 Irritable bowel syndrome without diarrhea: Secondary | ICD-10-CM | POA: Diagnosis not present

## 2015-08-27 DIAGNOSIS — G35 Multiple sclerosis: Secondary | ICD-10-CM | POA: Diagnosis not present

## 2015-08-27 DIAGNOSIS — C50911 Malignant neoplasm of unspecified site of right female breast: Secondary | ICD-10-CM | POA: Diagnosis not present

## 2015-08-27 DIAGNOSIS — M4802 Spinal stenosis, cervical region: Secondary | ICD-10-CM | POA: Diagnosis not present

## 2015-08-27 DIAGNOSIS — I890 Lymphedema, not elsewhere classified: Secondary | ICD-10-CM | POA: Diagnosis not present

## 2015-08-27 DIAGNOSIS — G40909 Epilepsy, unspecified, not intractable, without status epilepticus: Secondary | ICD-10-CM | POA: Diagnosis not present

## 2015-09-01 DIAGNOSIS — G40909 Epilepsy, unspecified, not intractable, without status epilepticus: Secondary | ICD-10-CM | POA: Diagnosis not present

## 2015-09-01 DIAGNOSIS — I890 Lymphedema, not elsewhere classified: Secondary | ICD-10-CM | POA: Diagnosis not present

## 2015-09-01 DIAGNOSIS — C50911 Malignant neoplasm of unspecified site of right female breast: Secondary | ICD-10-CM | POA: Diagnosis not present

## 2015-09-01 DIAGNOSIS — K589 Irritable bowel syndrome without diarrhea: Secondary | ICD-10-CM | POA: Diagnosis not present

## 2015-09-01 DIAGNOSIS — M4802 Spinal stenosis, cervical region: Secondary | ICD-10-CM | POA: Diagnosis not present

## 2015-09-01 DIAGNOSIS — G35 Multiple sclerosis: Secondary | ICD-10-CM | POA: Diagnosis not present

## 2015-09-02 DIAGNOSIS — G35 Multiple sclerosis: Secondary | ICD-10-CM | POA: Diagnosis not present

## 2015-09-02 DIAGNOSIS — I890 Lymphedema, not elsewhere classified: Secondary | ICD-10-CM | POA: Diagnosis not present

## 2015-09-02 DIAGNOSIS — C50911 Malignant neoplasm of unspecified site of right female breast: Secondary | ICD-10-CM | POA: Diagnosis not present

## 2015-09-02 DIAGNOSIS — K589 Irritable bowel syndrome without diarrhea: Secondary | ICD-10-CM | POA: Diagnosis not present

## 2015-09-02 DIAGNOSIS — G40909 Epilepsy, unspecified, not intractable, without status epilepticus: Secondary | ICD-10-CM | POA: Diagnosis not present

## 2015-09-02 DIAGNOSIS — M4802 Spinal stenosis, cervical region: Secondary | ICD-10-CM | POA: Diagnosis not present

## 2015-09-10 ENCOUNTER — Ambulatory Visit: Admission: RE | Admit: 2015-09-10 | Payer: Medicare Other | Source: Ambulatory Visit

## 2015-09-11 ENCOUNTER — Telehealth: Payer: Self-pay | Admitting: Diagnostic Neuroimaging

## 2015-09-11 DIAGNOSIS — M4802 Spinal stenosis, cervical region: Secondary | ICD-10-CM | POA: Diagnosis not present

## 2015-09-11 DIAGNOSIS — G35 Multiple sclerosis: Secondary | ICD-10-CM | POA: Diagnosis not present

## 2015-09-11 DIAGNOSIS — G40909 Epilepsy, unspecified, not intractable, without status epilepticus: Secondary | ICD-10-CM | POA: Diagnosis not present

## 2015-09-11 DIAGNOSIS — C50911 Malignant neoplasm of unspecified site of right female breast: Secondary | ICD-10-CM | POA: Diagnosis not present

## 2015-09-11 DIAGNOSIS — K589 Irritable bowel syndrome without diarrhea: Secondary | ICD-10-CM | POA: Diagnosis not present

## 2015-09-11 DIAGNOSIS — I890 Lymphedema, not elsewhere classified: Secondary | ICD-10-CM | POA: Diagnosis not present

## 2015-09-11 NOTE — Telephone Encounter (Signed)
Spoke with Aldrin PT w/[facility] and informed him that Dr Leta Baptist approves to continue with PT x 3 more weeks. He verbalized understanding, appreciation.

## 2015-09-11 NOTE — Telephone Encounter (Signed)
Aldrin, PT/[facility] [phone] called to request verbal order to continue Home Health PT twice a week for 3 more weeks.

## 2015-09-11 NOTE — Telephone Encounter (Signed)
Mark/[facility] called to report missed PT with pt. Pt had an MRI scheduled.  [phone]

## 2015-09-14 DIAGNOSIS — C50911 Malignant neoplasm of unspecified site of right female breast: Secondary | ICD-10-CM | POA: Diagnosis not present

## 2015-09-14 DIAGNOSIS — G35 Multiple sclerosis: Secondary | ICD-10-CM | POA: Diagnosis not present

## 2015-09-14 DIAGNOSIS — G40909 Epilepsy, unspecified, not intractable, without status epilepticus: Secondary | ICD-10-CM | POA: Diagnosis not present

## 2015-09-14 DIAGNOSIS — K589 Irritable bowel syndrome without diarrhea: Secondary | ICD-10-CM | POA: Diagnosis not present

## 2015-09-14 DIAGNOSIS — M4802 Spinal stenosis, cervical region: Secondary | ICD-10-CM | POA: Diagnosis not present

## 2015-09-14 DIAGNOSIS — I890 Lymphedema, not elsewhere classified: Secondary | ICD-10-CM | POA: Diagnosis not present

## 2015-09-16 ENCOUNTER — Other Ambulatory Visit: Payer: Self-pay | Admitting: *Deleted

## 2015-09-16 ENCOUNTER — Inpatient Hospital Stay: Payer: Medicare Other

## 2015-09-16 DIAGNOSIS — C50919 Malignant neoplasm of unspecified site of unspecified female breast: Secondary | ICD-10-CM

## 2015-09-16 MED ORDER — OXYCODONE HCL 10 MG PO TABS: 10.0000 mg | ORAL_TABLET | Freq: Four times a day (QID) | ORAL | 0 refills | Status: DC | PRN

## 2015-09-17 DIAGNOSIS — M4802 Spinal stenosis, cervical region: Secondary | ICD-10-CM | POA: Diagnosis not present

## 2015-09-17 DIAGNOSIS — C50911 Malignant neoplasm of unspecified site of right female breast: Secondary | ICD-10-CM | POA: Diagnosis not present

## 2015-09-17 DIAGNOSIS — G35 Multiple sclerosis: Secondary | ICD-10-CM | POA: Diagnosis not present

## 2015-09-17 DIAGNOSIS — K589 Irritable bowel syndrome without diarrhea: Secondary | ICD-10-CM | POA: Diagnosis not present

## 2015-09-17 DIAGNOSIS — G40909 Epilepsy, unspecified, not intractable, without status epilepticus: Secondary | ICD-10-CM | POA: Diagnosis not present

## 2015-09-17 DIAGNOSIS — I890 Lymphedema, not elsewhere classified: Secondary | ICD-10-CM | POA: Diagnosis not present

## 2015-09-18 ENCOUNTER — Other Ambulatory Visit: Payer: Self-pay | Admitting: Oncology

## 2015-09-18 ENCOUNTER — Inpatient Hospital Stay: Payer: Medicare Other

## 2015-09-21 ENCOUNTER — Ambulatory Visit: Payer: Medicare Other | Admitting: Diagnostic Neuroimaging

## 2015-09-21 DIAGNOSIS — I890 Lymphedema, not elsewhere classified: Secondary | ICD-10-CM | POA: Diagnosis not present

## 2015-09-21 DIAGNOSIS — K589 Irritable bowel syndrome without diarrhea: Secondary | ICD-10-CM | POA: Diagnosis not present

## 2015-09-21 DIAGNOSIS — C50911 Malignant neoplasm of unspecified site of right female breast: Secondary | ICD-10-CM | POA: Diagnosis not present

## 2015-09-21 DIAGNOSIS — G35 Multiple sclerosis: Secondary | ICD-10-CM | POA: Diagnosis not present

## 2015-09-21 DIAGNOSIS — M4802 Spinal stenosis, cervical region: Secondary | ICD-10-CM | POA: Diagnosis not present

## 2015-09-21 DIAGNOSIS — G40909 Epilepsy, unspecified, not intractable, without status epilepticus: Secondary | ICD-10-CM | POA: Diagnosis not present

## 2015-09-22 ENCOUNTER — Ambulatory Visit: Payer: Medicare Other

## 2015-09-23 ENCOUNTER — Inpatient Hospital Stay: Payer: Medicare Other | Attending: Oncology

## 2015-09-23 DIAGNOSIS — Z452 Encounter for adjustment and management of vascular access device: Secondary | ICD-10-CM | POA: Insufficient documentation

## 2015-09-23 DIAGNOSIS — C50911 Malignant neoplasm of unspecified site of right female breast: Secondary | ICD-10-CM | POA: Insufficient documentation

## 2015-09-23 DIAGNOSIS — G35 Multiple sclerosis: Secondary | ICD-10-CM | POA: Insufficient documentation

## 2015-09-23 DIAGNOSIS — Z171 Estrogen receptor negative status [ER-]: Secondary | ICD-10-CM | POA: Diagnosis not present

## 2015-09-23 MED ORDER — SODIUM CHLORIDE 0.9% FLUSH
10.0000 mL | Freq: Once | INTRAVENOUS | Status: AC
  Administered 2015-09-23: 10 mL via INTRAVENOUS
  Filled 2015-09-23: qty 10

## 2015-09-23 MED ORDER — HEPARIN SOD (PORK) LOCK FLUSH 100 UNIT/ML IV SOLN
500.0000 [IU] | Freq: Once | INTRAVENOUS | Status: AC
  Administered 2015-09-23: 500 [IU] via INTRAVENOUS
  Filled 2015-09-23: qty 5

## 2015-09-24 DIAGNOSIS — G35 Multiple sclerosis: Secondary | ICD-10-CM | POA: Diagnosis not present

## 2015-09-24 DIAGNOSIS — K589 Irritable bowel syndrome without diarrhea: Secondary | ICD-10-CM | POA: Diagnosis not present

## 2015-09-24 DIAGNOSIS — M4802 Spinal stenosis, cervical region: Secondary | ICD-10-CM | POA: Diagnosis not present

## 2015-09-24 DIAGNOSIS — G40909 Epilepsy, unspecified, not intractable, without status epilepticus: Secondary | ICD-10-CM | POA: Diagnosis not present

## 2015-09-24 DIAGNOSIS — I890 Lymphedema, not elsewhere classified: Secondary | ICD-10-CM | POA: Diagnosis not present

## 2015-09-24 DIAGNOSIS — C50911 Malignant neoplasm of unspecified site of right female breast: Secondary | ICD-10-CM | POA: Diagnosis not present

## 2015-09-28 ENCOUNTER — Other Ambulatory Visit: Payer: Self-pay | Admitting: *Deleted

## 2015-09-28 DIAGNOSIS — C50911 Malignant neoplasm of unspecified site of right female breast: Secondary | ICD-10-CM | POA: Diagnosis not present

## 2015-09-28 DIAGNOSIS — G40909 Epilepsy, unspecified, not intractable, without status epilepticus: Secondary | ICD-10-CM | POA: Diagnosis not present

## 2015-09-28 DIAGNOSIS — G35 Multiple sclerosis: Secondary | ICD-10-CM | POA: Diagnosis not present

## 2015-09-28 DIAGNOSIS — I890 Lymphedema, not elsewhere classified: Secondary | ICD-10-CM | POA: Diagnosis not present

## 2015-09-28 DIAGNOSIS — K589 Irritable bowel syndrome without diarrhea: Secondary | ICD-10-CM | POA: Diagnosis not present

## 2015-09-28 DIAGNOSIS — M4802 Spinal stenosis, cervical region: Secondary | ICD-10-CM | POA: Diagnosis not present

## 2015-09-28 MED ORDER — FENTANYL 50 MCG/HR TD PT72: 50.0000 ug | MEDICATED_PATCH | TRANSDERMAL | 0 refills | Status: DC

## 2015-10-01 ENCOUNTER — Telehealth: Payer: Self-pay | Admitting: Diagnostic Neuroimaging

## 2015-10-01 DIAGNOSIS — I890 Lymphedema, not elsewhere classified: Secondary | ICD-10-CM | POA: Diagnosis not present

## 2015-10-01 DIAGNOSIS — C50911 Malignant neoplasm of unspecified site of right female breast: Secondary | ICD-10-CM | POA: Diagnosis not present

## 2015-10-01 DIAGNOSIS — G40909 Epilepsy, unspecified, not intractable, without status epilepticus: Secondary | ICD-10-CM | POA: Diagnosis not present

## 2015-10-01 DIAGNOSIS — K589 Irritable bowel syndrome without diarrhea: Secondary | ICD-10-CM | POA: Diagnosis not present

## 2015-10-01 DIAGNOSIS — G35 Multiple sclerosis: Secondary | ICD-10-CM | POA: Diagnosis not present

## 2015-10-01 DIAGNOSIS — M4802 Spinal stenosis, cervical region: Secondary | ICD-10-CM | POA: Diagnosis not present

## 2015-10-01 NOTE — Telephone Encounter (Signed)
Spoke with Aldrin [facility] and advised him that Dr Leta Baptist agrees to verbal consent fo rpt to continue with PT. He verbalized understanding, appreciation.

## 2015-10-01 NOTE — Telephone Encounter (Signed)
Aldrin/ [facility] called for verbal orders: con't PT 2 x week for 5 weeks. Please call [phone]

## 2015-10-02 ENCOUNTER — Ambulatory Visit: Admission: RE | Admit: 2015-10-02 | Payer: Medicare Other | Source: Ambulatory Visit

## 2015-10-06 ENCOUNTER — Ambulatory Visit: Payer: Medicare Other

## 2015-10-06 DIAGNOSIS — G40909 Epilepsy, unspecified, not intractable, without status epilepticus: Secondary | ICD-10-CM | POA: Diagnosis not present

## 2015-10-06 DIAGNOSIS — M4802 Spinal stenosis, cervical region: Secondary | ICD-10-CM | POA: Diagnosis not present

## 2015-10-06 DIAGNOSIS — G35 Multiple sclerosis: Secondary | ICD-10-CM | POA: Diagnosis not present

## 2015-10-06 DIAGNOSIS — I890 Lymphedema, not elsewhere classified: Secondary | ICD-10-CM | POA: Diagnosis not present

## 2015-10-06 DIAGNOSIS — K589 Irritable bowel syndrome without diarrhea: Secondary | ICD-10-CM | POA: Diagnosis not present

## 2015-10-06 DIAGNOSIS — C50911 Malignant neoplasm of unspecified site of right female breast: Secondary | ICD-10-CM | POA: Diagnosis not present

## 2015-10-07 ENCOUNTER — Ambulatory Visit
Admission: RE | Admit: 2015-10-07 | Discharge: 2015-10-07 | Disposition: A | Discharge status: Home / Self Care | Payer: Medicare Other | Source: Ambulatory Visit | Attending: Diagnostic Neuroimaging | Admitting: Diagnostic Neuroimaging

## 2015-10-07 DIAGNOSIS — C50911 Malignant neoplasm of unspecified site of right female breast: Secondary | ICD-10-CM | POA: Diagnosis not present

## 2015-10-07 DIAGNOSIS — R51 Headache: Secondary | ICD-10-CM | POA: Insufficient documentation

## 2015-10-07 DIAGNOSIS — Z9181 History of falling: Secondary | ICD-10-CM | POA: Diagnosis not present

## 2015-10-07 DIAGNOSIS — M4802 Spinal stenosis, cervical region: Secondary | ICD-10-CM | POA: Diagnosis not present

## 2015-10-07 DIAGNOSIS — G35 Multiple sclerosis: Secondary | ICD-10-CM | POA: Insufficient documentation

## 2015-10-07 DIAGNOSIS — I890 Lymphedema, not elsewhere classified: Secondary | ICD-10-CM | POA: Diagnosis not present

## 2015-10-07 DIAGNOSIS — G40909 Epilepsy, unspecified, not intractable, without status epilepticus: Secondary | ICD-10-CM | POA: Diagnosis not present

## 2015-10-07 DIAGNOSIS — Z853 Personal history of malignant neoplasm of breast: Secondary | ICD-10-CM | POA: Diagnosis not present

## 2015-10-07 DIAGNOSIS — K589 Irritable bowel syndrome without diarrhea: Secondary | ICD-10-CM | POA: Diagnosis not present

## 2015-10-07 DIAGNOSIS — R519 Headache, unspecified: Secondary | ICD-10-CM

## 2015-10-07 DIAGNOSIS — C801 Malignant (primary) neoplasm, unspecified: Secondary | ICD-10-CM

## 2015-10-07 MED ORDER — GADOBENATE DIMEGLUMINE 529 MG/ML IV SOLN
20.0000 mL | Freq: Once | INTRAVENOUS | Status: AC | PRN
  Administered 2015-10-07: 16 mL via INTRAVENOUS

## 2015-10-09 DIAGNOSIS — M4802 Spinal stenosis, cervical region: Secondary | ICD-10-CM | POA: Diagnosis not present

## 2015-10-09 DIAGNOSIS — G35 Multiple sclerosis: Secondary | ICD-10-CM | POA: Diagnosis not present

## 2015-10-09 DIAGNOSIS — I890 Lymphedema, not elsewhere classified: Secondary | ICD-10-CM | POA: Diagnosis not present

## 2015-10-09 DIAGNOSIS — C50911 Malignant neoplasm of unspecified site of right female breast: Secondary | ICD-10-CM | POA: Diagnosis not present

## 2015-10-09 DIAGNOSIS — G40909 Epilepsy, unspecified, not intractable, without status epilepticus: Secondary | ICD-10-CM | POA: Diagnosis not present

## 2015-10-09 DIAGNOSIS — K589 Irritable bowel syndrome without diarrhea: Secondary | ICD-10-CM | POA: Diagnosis not present

## 2015-10-13 ENCOUNTER — Telehealth: Payer: Self-pay | Admitting: *Deleted

## 2015-10-13 DIAGNOSIS — G35 Multiple sclerosis: Secondary | ICD-10-CM | POA: Diagnosis not present

## 2015-10-13 DIAGNOSIS — K589 Irritable bowel syndrome without diarrhea: Secondary | ICD-10-CM | POA: Diagnosis not present

## 2015-10-13 DIAGNOSIS — M4802 Spinal stenosis, cervical region: Secondary | ICD-10-CM | POA: Diagnosis not present

## 2015-10-13 DIAGNOSIS — G40909 Epilepsy, unspecified, not intractable, without status epilepticus: Secondary | ICD-10-CM | POA: Diagnosis not present

## 2015-10-13 DIAGNOSIS — C50911 Malignant neoplasm of unspecified site of right female breast: Secondary | ICD-10-CM | POA: Diagnosis not present

## 2015-10-13 DIAGNOSIS — I890 Lymphedema, not elsewhere classified: Secondary | ICD-10-CM | POA: Diagnosis not present

## 2015-10-13 NOTE — Telephone Encounter (Signed)
Per Dr Leta Baptist, LVM  for patient informing her that her MRI head results are stable from Aug 2016. Dr Leta Baptist will continue with her current treatment plan. Reminded her of her FU 12/07/15. Left name, number for any questions.

## 2015-10-15 ENCOUNTER — Other Ambulatory Visit: Payer: Self-pay | Admitting: *Deleted

## 2015-10-15 DIAGNOSIS — C50919 Malignant neoplasm of unspecified site of unspecified female breast: Secondary | ICD-10-CM

## 2015-10-15 MED ORDER — OXYCODONE HCL 10 MG PO TABS: 10.0000 mg | ORAL_TABLET | Freq: Four times a day (QID) | ORAL | 0 refills | Status: DC | PRN

## 2015-10-15 NOTE — Telephone Encounter (Signed)
Please see note.

## 2015-10-16 ENCOUNTER — Telehealth: Payer: Self-pay | Admitting: Diagnostic Neuroimaging

## 2015-10-16 NOTE — Telephone Encounter (Signed)
Pt.notified

## 2015-10-16 NOTE — Telephone Encounter (Signed)
Rebecca [name]/[facility] [phone] called to advise of missed PT visit today, Rebecca [name] called this morning to cancel, Rebecca Rinks went by house before he received info about cxd appt and Rebecca [name] along w/husband/Caregiver stated Rebecca [name] not up to therapy today.

## 2015-10-16 NOTE — Telephone Encounter (Signed)
Noted  

## 2015-10-18 ENCOUNTER — Other Ambulatory Visit: Payer: Self-pay | Admitting: Oncology

## 2015-10-20 DIAGNOSIS — G35 Multiple sclerosis: Secondary | ICD-10-CM | POA: Diagnosis not present

## 2015-10-20 DIAGNOSIS — C50911 Malignant neoplasm of unspecified site of right female breast: Secondary | ICD-10-CM | POA: Diagnosis not present

## 2015-10-20 DIAGNOSIS — I890 Lymphedema, not elsewhere classified: Secondary | ICD-10-CM | POA: Diagnosis not present

## 2015-10-20 DIAGNOSIS — M4802 Spinal stenosis, cervical region: Secondary | ICD-10-CM | POA: Diagnosis not present

## 2015-10-20 DIAGNOSIS — G40909 Epilepsy, unspecified, not intractable, without status epilepticus: Secondary | ICD-10-CM | POA: Diagnosis not present

## 2015-10-20 DIAGNOSIS — K589 Irritable bowel syndrome without diarrhea: Secondary | ICD-10-CM | POA: Diagnosis not present

## 2015-10-23 DIAGNOSIS — C50911 Malignant neoplasm of unspecified site of right female breast: Secondary | ICD-10-CM | POA: Diagnosis not present

## 2015-10-23 DIAGNOSIS — K589 Irritable bowel syndrome without diarrhea: Secondary | ICD-10-CM | POA: Diagnosis not present

## 2015-10-23 DIAGNOSIS — G40909 Epilepsy, unspecified, not intractable, without status epilepticus: Secondary | ICD-10-CM | POA: Diagnosis not present

## 2015-10-23 DIAGNOSIS — I890 Lymphedema, not elsewhere classified: Secondary | ICD-10-CM | POA: Diagnosis not present

## 2015-10-23 DIAGNOSIS — G35 Multiple sclerosis: Secondary | ICD-10-CM | POA: Diagnosis not present

## 2015-10-23 DIAGNOSIS — M4802 Spinal stenosis, cervical region: Secondary | ICD-10-CM | POA: Diagnosis not present

## 2015-10-24 ENCOUNTER — Other Ambulatory Visit: Payer: Self-pay | Admitting: Family Medicine

## 2015-10-25 NOTE — Telephone Encounter (Signed)
Approval given by heme-onc to continue

## 2015-10-26 ENCOUNTER — Ambulatory Visit: Payer: Medicare Other | Admitting: Family Medicine

## 2015-10-27 DIAGNOSIS — C50911 Malignant neoplasm of unspecified site of right female breast: Secondary | ICD-10-CM | POA: Diagnosis not present

## 2015-10-27 DIAGNOSIS — G35 Multiple sclerosis: Secondary | ICD-10-CM | POA: Diagnosis not present

## 2015-10-27 DIAGNOSIS — G40909 Epilepsy, unspecified, not intractable, without status epilepticus: Secondary | ICD-10-CM | POA: Diagnosis not present

## 2015-10-27 DIAGNOSIS — M4802 Spinal stenosis, cervical region: Secondary | ICD-10-CM | POA: Diagnosis not present

## 2015-10-27 DIAGNOSIS — I890 Lymphedema, not elsewhere classified: Secondary | ICD-10-CM | POA: Diagnosis not present

## 2015-10-27 DIAGNOSIS — K589 Irritable bowel syndrome without diarrhea: Secondary | ICD-10-CM | POA: Diagnosis not present

## 2015-10-28 DIAGNOSIS — J309 Allergic rhinitis, unspecified: Secondary | ICD-10-CM | POA: Insufficient documentation

## 2015-10-28 DIAGNOSIS — D696 Thrombocytopenia, unspecified: Secondary | ICD-10-CM | POA: Insufficient documentation

## 2015-10-28 DIAGNOSIS — Z853 Personal history of malignant neoplasm of breast: Secondary | ICD-10-CM | POA: Insufficient documentation

## 2015-10-28 DIAGNOSIS — K589 Irritable bowel syndrome without diarrhea: Secondary | ICD-10-CM | POA: Insufficient documentation

## 2015-10-28 DIAGNOSIS — D649 Anemia, unspecified: Secondary | ICD-10-CM | POA: Insufficient documentation

## 2015-10-28 NOTE — Assessment & Plan Note (Signed)
Lymphedema; last LVEF 60-65% Jan 2016 echo per notes

## 2015-10-28 NOTE — Assessment & Plan Note (Signed)
Managed by neurologist

## 2015-10-28 NOTE — Assessment & Plan Note (Signed)
Managed by heme-onc; careful about NSAIDs; will pose question to him if okay to continue

## 2015-10-28 NOTE — Assessment & Plan Note (Signed)
Currently stable.

## 2015-10-28 NOTE — Assessment & Plan Note (Signed)
Managed and followed up by heme-one

## 2015-10-28 NOTE — Assessment & Plan Note (Signed)
Using plain antihistamines

## 2015-10-28 NOTE — Assessment & Plan Note (Signed)
>>  ASSESSMENT AND PLAN FOR BILATERAL LOWER EXTREMITY EDEMA WRITTEN ON 10/28/2015  9:32 AM BY LADA, MELINDA P, MD  Lymphedema; last LVEF 60-65% Jan 2016 echo per notes

## 2015-10-30 ENCOUNTER — Other Ambulatory Visit: Payer: Self-pay | Admitting: *Deleted

## 2015-10-30 DIAGNOSIS — M4802 Spinal stenosis, cervical region: Secondary | ICD-10-CM | POA: Diagnosis not present

## 2015-10-30 DIAGNOSIS — C50911 Malignant neoplasm of unspecified site of right female breast: Secondary | ICD-10-CM | POA: Diagnosis not present

## 2015-10-30 DIAGNOSIS — G35 Multiple sclerosis: Secondary | ICD-10-CM | POA: Diagnosis not present

## 2015-10-30 DIAGNOSIS — I890 Lymphedema, not elsewhere classified: Secondary | ICD-10-CM | POA: Diagnosis not present

## 2015-10-30 DIAGNOSIS — G40909 Epilepsy, unspecified, not intractable, without status epilepticus: Secondary | ICD-10-CM | POA: Diagnosis not present

## 2015-10-30 DIAGNOSIS — K589 Irritable bowel syndrome without diarrhea: Secondary | ICD-10-CM | POA: Diagnosis not present

## 2015-10-30 MED ORDER — FENTANYL 50 MCG/HR TD PT72: 50.0000 ug | MEDICATED_PATCH | TRANSDERMAL | 0 refills | Status: DC

## 2015-11-02 ENCOUNTER — Telehealth: Payer: Self-pay | Admitting: Diagnostic Neuroimaging

## 2015-11-02 NOTE — Telephone Encounter (Signed)
Shirlean Mylar Therapist [phone] called to request verbal order for social work referral to assist w/community resources and private care services. Requests call back today.

## 2015-11-02 NOTE — Telephone Encounter (Signed)
Spoke with Rebecca [name] and informed her Dr Leta Baptist gave verbal order for social work referral to assist w/community resources and private care services as she requested. She verbalized understanding, appreciation.

## 2015-11-03 DIAGNOSIS — G35 Multiple sclerosis: Secondary | ICD-10-CM | POA: Diagnosis not present

## 2015-11-03 DIAGNOSIS — G40909 Epilepsy, unspecified, not intractable, without status epilepticus: Secondary | ICD-10-CM | POA: Diagnosis not present

## 2015-11-03 DIAGNOSIS — I890 Lymphedema, not elsewhere classified: Secondary | ICD-10-CM | POA: Diagnosis not present

## 2015-11-03 DIAGNOSIS — K589 Irritable bowel syndrome without diarrhea: Secondary | ICD-10-CM | POA: Diagnosis not present

## 2015-11-03 DIAGNOSIS — M4802 Spinal stenosis, cervical region: Secondary | ICD-10-CM | POA: Diagnosis not present

## 2015-11-03 DIAGNOSIS — C50911 Malignant neoplasm of unspecified site of right female breast: Secondary | ICD-10-CM | POA: Diagnosis not present

## 2015-11-04 ENCOUNTER — Inpatient Hospital Stay: Payer: Medicare Other

## 2015-11-06 ENCOUNTER — Telehealth: Payer: Self-pay | Admitting: Diagnostic Neuroimaging

## 2015-11-06 DIAGNOSIS — K589 Irritable bowel syndrome without diarrhea: Secondary | ICD-10-CM | POA: Diagnosis not present

## 2015-11-06 DIAGNOSIS — I890 Lymphedema, not elsewhere classified: Secondary | ICD-10-CM | POA: Diagnosis not present

## 2015-11-06 DIAGNOSIS — G40909 Epilepsy, unspecified, not intractable, without status epilepticus: Secondary | ICD-10-CM | POA: Diagnosis not present

## 2015-11-06 DIAGNOSIS — G35 Multiple sclerosis: Secondary | ICD-10-CM | POA: Diagnosis not present

## 2015-11-06 DIAGNOSIS — M4802 Spinal stenosis, cervical region: Secondary | ICD-10-CM | POA: Diagnosis not present

## 2015-11-06 DIAGNOSIS — C50911 Malignant neoplasm of unspecified site of right female breast: Secondary | ICD-10-CM | POA: Diagnosis not present

## 2015-11-06 NOTE — Telephone Encounter (Signed)
[place] called request PT for 2x wk x4 weeks

## 2015-11-06 NOTE — Telephone Encounter (Signed)
Per Dr Leta Baptist, spoke with Rebecca [name] and advised patient may receive PT for 2x wk x4 weeks. He verbalized understanding, appreciation.

## 2015-11-10 ENCOUNTER — Inpatient Hospital Stay: Payer: Medicare Other

## 2015-11-11 DIAGNOSIS — C50911 Malignant neoplasm of unspecified site of right female breast: Secondary | ICD-10-CM | POA: Diagnosis not present

## 2015-11-11 DIAGNOSIS — M4802 Spinal stenosis, cervical region: Secondary | ICD-10-CM | POA: Diagnosis not present

## 2015-11-11 DIAGNOSIS — G40909 Epilepsy, unspecified, not intractable, without status epilepticus: Secondary | ICD-10-CM | POA: Diagnosis not present

## 2015-11-11 DIAGNOSIS — I890 Lymphedema, not elsewhere classified: Secondary | ICD-10-CM | POA: Diagnosis not present

## 2015-11-11 DIAGNOSIS — K589 Irritable bowel syndrome without diarrhea: Secondary | ICD-10-CM | POA: Diagnosis not present

## 2015-11-11 DIAGNOSIS — G35 Multiple sclerosis: Secondary | ICD-10-CM | POA: Diagnosis not present

## 2015-11-12 ENCOUNTER — Inpatient Hospital Stay: Payer: Medicare Other

## 2015-11-13 DIAGNOSIS — C50911 Malignant neoplasm of unspecified site of right female breast: Secondary | ICD-10-CM | POA: Diagnosis not present

## 2015-11-13 DIAGNOSIS — G40909 Epilepsy, unspecified, not intractable, without status epilepticus: Secondary | ICD-10-CM | POA: Diagnosis not present

## 2015-11-13 DIAGNOSIS — G35 Multiple sclerosis: Secondary | ICD-10-CM | POA: Diagnosis not present

## 2015-11-13 DIAGNOSIS — K589 Irritable bowel syndrome without diarrhea: Secondary | ICD-10-CM | POA: Diagnosis not present

## 2015-11-13 DIAGNOSIS — I890 Lymphedema, not elsewhere classified: Secondary | ICD-10-CM | POA: Diagnosis not present

## 2015-11-13 DIAGNOSIS — M4802 Spinal stenosis, cervical region: Secondary | ICD-10-CM | POA: Diagnosis not present

## 2015-11-16 ENCOUNTER — Other Ambulatory Visit: Payer: Self-pay | Admitting: *Deleted

## 2015-11-16 DIAGNOSIS — G40909 Epilepsy, unspecified, not intractable, without status epilepticus: Secondary | ICD-10-CM | POA: Diagnosis not present

## 2015-11-16 DIAGNOSIS — M4802 Spinal stenosis, cervical region: Secondary | ICD-10-CM | POA: Diagnosis not present

## 2015-11-16 DIAGNOSIS — C50911 Malignant neoplasm of unspecified site of right female breast: Secondary | ICD-10-CM | POA: Diagnosis not present

## 2015-11-16 DIAGNOSIS — K589 Irritable bowel syndrome without diarrhea: Secondary | ICD-10-CM | POA: Diagnosis not present

## 2015-11-16 DIAGNOSIS — G35 Multiple sclerosis: Secondary | ICD-10-CM | POA: Diagnosis not present

## 2015-11-16 DIAGNOSIS — I890 Lymphedema, not elsewhere classified: Secondary | ICD-10-CM | POA: Diagnosis not present

## 2015-11-16 MED ORDER — OXYCODONE HCL 10 MG PO TABS: 10.0000 mg | ORAL_TABLET | Freq: Four times a day (QID) | ORAL | 0 refills | Status: DC | PRN

## 2015-11-17 ENCOUNTER — Inpatient Hospital Stay: Payer: Medicare Other | Attending: Oncology

## 2015-11-17 DIAGNOSIS — C50911 Malignant neoplasm of unspecified site of right female breast: Secondary | ICD-10-CM

## 2015-11-17 DIAGNOSIS — Z452 Encounter for adjustment and management of vascular access device: Secondary | ICD-10-CM | POA: Diagnosis not present

## 2015-11-17 DIAGNOSIS — Z171 Estrogen receptor negative status [ER-]: Secondary | ICD-10-CM | POA: Diagnosis not present

## 2015-11-17 DIAGNOSIS — G35 Multiple sclerosis: Secondary | ICD-10-CM | POA: Diagnosis not present

## 2015-11-17 MED ORDER — HEPARIN SOD (PORK) LOCK FLUSH 100 UNIT/ML IV SOLN
500.0000 [IU] | Freq: Once | INTRAVENOUS | Status: AC
  Administered 2015-11-17: 500 [IU] via INTRAVENOUS

## 2015-11-17 MED ORDER — HEPARIN SOD (PORK) LOCK FLUSH 100 UNIT/ML IV SOLN
INTRAVENOUS | Status: AC
  Filled 2015-11-17: qty 5

## 2015-11-17 MED ORDER — SODIUM CHLORIDE 0.9% FLUSH
10.0000 mL | Freq: Once | INTRAVENOUS | Status: AC
  Administered 2015-11-17: 10 mL via INTRAVENOUS
  Filled 2015-11-17: qty 10

## 2015-11-18 ENCOUNTER — Other Ambulatory Visit: Payer: Self-pay | Admitting: *Deleted

## 2015-11-18 DIAGNOSIS — I890 Lymphedema, not elsewhere classified: Secondary | ICD-10-CM | POA: Diagnosis not present

## 2015-11-18 DIAGNOSIS — G35 Multiple sclerosis: Secondary | ICD-10-CM | POA: Diagnosis not present

## 2015-11-18 DIAGNOSIS — C50911 Malignant neoplasm of unspecified site of right female breast: Secondary | ICD-10-CM | POA: Diagnosis not present

## 2015-11-18 DIAGNOSIS — G40909 Epilepsy, unspecified, not intractable, without status epilepticus: Secondary | ICD-10-CM | POA: Diagnosis not present

## 2015-11-18 DIAGNOSIS — M4802 Spinal stenosis, cervical region: Secondary | ICD-10-CM | POA: Diagnosis not present

## 2015-11-18 DIAGNOSIS — K589 Irritable bowel syndrome without diarrhea: Secondary | ICD-10-CM | POA: Diagnosis not present

## 2015-11-18 MED ORDER — PROCHLORPERAZINE MALEATE 10 MG PO TABS: 10.0000 mg | ORAL_TABLET | Freq: Four times a day (QID) | ORAL | 1 refills | Status: DC | PRN

## 2015-11-19 DIAGNOSIS — G40909 Epilepsy, unspecified, not intractable, without status epilepticus: Secondary | ICD-10-CM | POA: Diagnosis not present

## 2015-11-19 DIAGNOSIS — M4802 Spinal stenosis, cervical region: Secondary | ICD-10-CM | POA: Diagnosis not present

## 2015-11-19 DIAGNOSIS — I890 Lymphedema, not elsewhere classified: Secondary | ICD-10-CM | POA: Diagnosis not present

## 2015-11-19 DIAGNOSIS — C50911 Malignant neoplasm of unspecified site of right female breast: Secondary | ICD-10-CM | POA: Diagnosis not present

## 2015-11-19 DIAGNOSIS — G35 Multiple sclerosis: Secondary | ICD-10-CM | POA: Diagnosis not present

## 2015-11-19 DIAGNOSIS — K589 Irritable bowel syndrome without diarrhea: Secondary | ICD-10-CM | POA: Diagnosis not present

## 2015-11-20 DIAGNOSIS — K589 Irritable bowel syndrome without diarrhea: Secondary | ICD-10-CM | POA: Diagnosis not present

## 2015-11-20 DIAGNOSIS — C50911 Malignant neoplasm of unspecified site of right female breast: Secondary | ICD-10-CM | POA: Diagnosis not present

## 2015-11-20 DIAGNOSIS — G35 Multiple sclerosis: Secondary | ICD-10-CM | POA: Diagnosis not present

## 2015-11-20 DIAGNOSIS — G40909 Epilepsy, unspecified, not intractable, without status epilepticus: Secondary | ICD-10-CM | POA: Diagnosis not present

## 2015-11-20 DIAGNOSIS — M4802 Spinal stenosis, cervical region: Secondary | ICD-10-CM | POA: Diagnosis not present

## 2015-11-20 DIAGNOSIS — I890 Lymphedema, not elsewhere classified: Secondary | ICD-10-CM | POA: Diagnosis not present

## 2015-11-24 DIAGNOSIS — C50911 Malignant neoplasm of unspecified site of right female breast: Secondary | ICD-10-CM | POA: Diagnosis not present

## 2015-11-24 DIAGNOSIS — G40909 Epilepsy, unspecified, not intractable, without status epilepticus: Secondary | ICD-10-CM | POA: Diagnosis not present

## 2015-11-24 DIAGNOSIS — M4802 Spinal stenosis, cervical region: Secondary | ICD-10-CM | POA: Diagnosis not present

## 2015-11-24 DIAGNOSIS — G35 Multiple sclerosis: Secondary | ICD-10-CM | POA: Diagnosis not present

## 2015-11-24 DIAGNOSIS — I890 Lymphedema, not elsewhere classified: Secondary | ICD-10-CM | POA: Diagnosis not present

## 2015-11-24 DIAGNOSIS — K589 Irritable bowel syndrome without diarrhea: Secondary | ICD-10-CM | POA: Diagnosis not present

## 2015-11-25 ENCOUNTER — Encounter: Payer: Self-pay | Admitting: Family Medicine

## 2015-11-25 ENCOUNTER — Ambulatory Visit (INDEPENDENT_AMBULATORY_CARE_PROVIDER_SITE_OTHER): Payer: Medicare Other | Admitting: Family Medicine

## 2015-11-25 VITALS — BP 110/62 | HR 69 | Temp 97.9°F | Resp 16 | Ht 64.0 in | Wt 170.0 lb

## 2015-11-25 DIAGNOSIS — I890 Lymphedema, not elsewhere classified: Secondary | ICD-10-CM | POA: Diagnosis not present

## 2015-11-25 DIAGNOSIS — Z0000 Encounter for general adult medical examination without abnormal findings: Secondary | ICD-10-CM | POA: Insufficient documentation

## 2015-11-25 DIAGNOSIS — G40909 Epilepsy, unspecified, not intractable, without status epilepticus: Secondary | ICD-10-CM | POA: Diagnosis not present

## 2015-11-25 DIAGNOSIS — Z23 Encounter for immunization: Secondary | ICD-10-CM | POA: Diagnosis not present

## 2015-11-25 DIAGNOSIS — C50911 Malignant neoplasm of unspecified site of right female breast: Secondary | ICD-10-CM | POA: Diagnosis not present

## 2015-11-25 DIAGNOSIS — K589 Irritable bowel syndrome without diarrhea: Secondary | ICD-10-CM | POA: Diagnosis not present

## 2015-11-25 DIAGNOSIS — M4802 Spinal stenosis, cervical region: Secondary | ICD-10-CM | POA: Diagnosis not present

## 2015-11-25 DIAGNOSIS — G35 Multiple sclerosis: Secondary | ICD-10-CM | POA: Diagnosis not present

## 2015-11-25 DIAGNOSIS — Z124 Encounter for screening for malignant neoplasm of cervix: Secondary | ICD-10-CM

## 2015-11-25 LAB — LIPID PANEL
Cholesterol: 214 mg/dL — ABNORMAL HIGH (ref 125–200)
HDL: 58 mg/dL (ref >=46)
LDL Cholesterol: 134 mg/dL — ABNORMAL HIGH (ref <130)
Total CHOL/HDL Ratio: 3.7 Ratio (ref <=5.0)
Triglycerides: 109 mg/dL (ref <150)
VLDL: 22 mg/dL (ref <30)

## 2015-11-25 LAB — CBC WITH DIFFERENTIAL/PLATELET
BASOS PCT: 0 %
Basophils Absolute: 0 cells/uL (ref 0–200)
Eosinophils Absolute: 35 cells/uL (ref 15–500)
Eosinophils Relative: 1 %
HCT: 37.5 % (ref 35.0–45.0)
Hemoglobin: 12.6 g/dL (ref 11.7–15.5)
Lymphocytes Relative: 25 %
Lymphs Abs: 875 cells/uL (ref 850–3900)
MCH: 30.2 pg (ref 27.0–33.0)
MCHC: 33.6 g/dL (ref 32.0–36.0)
MCV: 89.9 fL (ref 80.0–100.0)
MONO ABS: 280 {cells}/uL (ref 200–950)
MONOS PCT: 8 %
MPV: 12 fL (ref 7.5–12.5)
NEUTROS ABS: 2310 {cells}/uL (ref 1500–7800)
Neutrophils Relative %: 66 %
PLATELETS: 138 10*3/uL — AB (ref 140–400)
RBC: 4.17 MIL/uL (ref 3.80–5.10)
RDW: 13.1 % (ref 11.0–15.0)
WBC: 3.5 10*3/uL — ABNORMAL LOW (ref 3.8–10.8)

## 2015-11-25 LAB — COMPLETE METABOLIC PANEL WITH GFR
ALT: 24 U/L (ref 6–29)
AST: 25 U/L (ref 10–35)
Albumin: 4.3 g/dL (ref 3.6–5.1)
Alkaline Phosphatase: 73 U/L (ref 33–130)
BILIRUBIN TOTAL: 0.3 mg/dL (ref 0.2–1.2)
BUN: 13 mg/dL (ref 7–25)
CO2: 29 mmol/L (ref 20–31)
CREATININE: 0.49 mg/dL — AB (ref 0.50–1.05)
Calcium: 9.6 mg/dL (ref 8.6–10.4)
Chloride: 103 mmol/L (ref 98–110)
GFR, Est African American: >89 mL/min (ref >=60)
GFR, Est Non African American: >89 mL/min (ref >=60)
GLUCOSE: 95 mg/dL (ref 65–99)
Potassium: 4.3 mmol/L (ref 3.5–5.3)
SODIUM: 141 mmol/L (ref 135–146)
TOTAL PROTEIN: 7.2 g/dL (ref 6.1–8.1)

## 2015-11-25 LAB — TSH: TSH: 1.83 mIU/L

## 2015-11-25 NOTE — Patient Instructions (Addendum)
We'll get labs today We'll check to see if a local gynecologist has a special table/chair We'll see what Dr. Grayland Ormond says about the BRCA testing  Health Maintenance, Female Adopting a healthy lifestyle and getting preventive care can go a long way to promote health and wellness. Talk with your health care provider about what schedule of regular examinations is right for you. This is a good chance for you to check in with your provider about disease prevention and staying healthy. In between checkups, there are plenty of things you can do on your own. Experts have done a lot of research about which lifestyle changes and preventive measures are most likely to keep you healthy. Ask your health care provider for more information. WEIGHT AND DIET  Eat a healthy diet  Be sure to include plenty of vegetables, fruits, low-fat dairy products, and lean protein.  Do not eat a lot of foods high in solid fats, added sugars, or salt.  Get regular exercise. This is one of the most important things you can do for your health.  Most adults should exercise for at least 150 minutes each week. The exercise should increase your heart rate and make you sweat (moderate-intensity exercise).  Most adults should also do strengthening exercises at least twice a week. This is in addition to the moderate-intensity exercise.  Maintain a healthy weight  Body mass index (BMI) is a measurement that can be used to identify possible weight problems. It estimates body fat based on height and weight. Your health care provider can help determine your BMI and help you achieve or maintain a healthy weight.  For females 62 years of age and older:   A BMI below 18.5 is considered underweight.  A BMI of 18.5 to 24.9 is normal.  A BMI of 25 to 29.9 is considered overweight.  A BMI of 30 and above is considered obese.  Watch levels of cholesterol and blood lipids  You should start having your blood tested for lipids and  cholesterol at 55 years of age, then have this test every 5 years.  You may need to have your cholesterol levels checked more often if:  Your lipid or cholesterol levels are high.  You are older than 55 years of age.  You are at high risk for heart disease.  CANCER SCREENING   Lung Cancer  Lung cancer screening is recommended for adults 79-49 years old who are at high risk for lung cancer because of a history of smoking.  A yearly low-dose CT scan of the lungs is recommended for people who:  Currently smoke.  Have quit within the past 15 years.  Have at least a 30-pack-year history of smoking. A pack year is smoking an average of one pack of cigarettes a day for 1 year.  Yearly screening should continue until it has been 15 years since you quit.  Yearly screening should stop if you develop a health problem that would prevent you from having lung cancer treatment.  Breast Cancer  Practice breast self-awareness. This means understanding how your breasts normally appear and feel.  It also means doing regular breast self-exams. Let your health care provider know about any changes, no matter how small.  If you are in your 20s or 30s, you should have a clinical breast exam (CBE) by a health care provider every 1-3 years as part of a regular health exam.  If you are 65 or older, have a CBE every year. Also consider having a  breast X-ray (mammogram) every year.  If you have a family history of breast cancer, talk to your health care provider about genetic screening.  If you are at high risk for breast cancer, talk to your health care provider about having an MRI and a mammogram every year.  Breast cancer gene (BRCA) assessment is recommended for women who have family members with BRCA-related cancers. BRCA-related cancers include:  Breast.  Ovarian.  Tubal.  Peritoneal cancers.  Results of the assessment will determine the need for genetic counseling and BRCA1 and BRCA2  testing. Cervical Cancer Your health care provider may recommend that you be screened regularly for cancer of the pelvic organs (ovaries, uterus, and vagina). This screening involves a pelvic examination, including checking for microscopic changes to the surface of your cervix (Pap test). You may be encouraged to have this screening done every 3 years, beginning at age 21.  For women ages 30-65, health care providers may recommend pelvic exams and Pap testing every 3 years, or they may recommend the Pap and pelvic exam, combined with testing for human papilloma virus (HPV), every 5 years. Some types of HPV increase your risk of cervical cancer. Testing for HPV may also be done on women of any age with unclear Pap test results.  Other health care providers may not recommend any screening for nonpregnant women who are considered low risk for pelvic cancer and who do not have symptoms. Ask your health care provider if a screening pelvic exam is right for you.  If you have had past treatment for cervical cancer or a condition that could lead to cancer, you need Pap tests and screening for cancer for at least 20 years after your treatment. If Pap tests have been discontinued, your risk factors (such as having a new sexual partner) need to be reassessed to determine if screening should resume. Some women have medical problems that increase the chance of getting cervical cancer. In these cases, your health care provider may recommend more frequent screening and Pap tests. Colorectal Cancer  This type of cancer can be detected and often prevented.  Routine colorectal cancer screening usually begins at 55 years of age and continues through 55 years of age.  Your health care provider may recommend screening at an earlier age if you have risk factors for colon cancer.  Your health care provider may also recommend using home test kits to check for hidden blood in the stool.  A small camera at the end of a  tube can be used to examine your colon directly (sigmoidoscopy or colonoscopy). This is done to check for the earliest forms of colorectal cancer.  Routine screening usually begins at age 50.  Direct examination of the colon should be repeated every 5-10 years through 55 years of age. However, you may need to be screened more often if early forms of precancerous polyps or small growths are found. Skin Cancer  Check your skin from head to toe regularly.  Tell your health care provider about any new moles or changes in moles, especially if there is a change in a mole's shape or color.  Also tell your health care provider if you have a mole that is larger than the size of a pencil eraser.  Always use sunscreen. Apply sunscreen liberally and repeatedly throughout the day.  Protect yourself by wearing long sleeves, pants, a wide-brimmed hat, and sunglasses whenever you are outside. HEART DISEASE, DIABETES, AND HIGH BLOOD PRESSURE   High blood pressure causes   heart disease and increases the risk of stroke. High blood pressure is more likely to develop in:  People who have blood pressure in the high end of the normal range (130-139/85-89 mm Hg).  People who are overweight or obese.  People who are African American.  If you are 18-39 years of age, have your blood pressure checked every 3-5 years. If you are 40 years of age or older, have your blood pressure checked every year. You should have your blood pressure measured twice--once when you are at a hospital or clinic, and once when you are not at a hospital or clinic. Record the average of the two measurements. To check your blood pressure when you are not at a hospital or clinic, you can use:  An automated blood pressure machine at a pharmacy.  A home blood pressure monitor.  If you are between 55 years and 79 years old, ask your health care provider if you should take aspirin to prevent strokes.  Have regular diabetes screenings. This  involves taking a blood sample to check your fasting blood sugar level.  If you are at a normal weight and have a low risk for diabetes, have this test once every three years after 55 years of age.  If you are overweight and have a high risk for diabetes, consider being tested at a younger age or more often. PREVENTING INFECTION  Hepatitis B  If you have a higher risk for hepatitis B, you should be screened for this virus. You are considered at high risk for hepatitis B if:  You were born in a country where hepatitis B is common. Ask your health care provider which countries are considered high risk.  Your parents were born in a high-risk country, and you have not been immunized against hepatitis B (hepatitis B vaccine).  You have HIV or AIDS.  You use needles to inject street drugs.  You live with someone who has hepatitis B.  You have had sex with someone who has hepatitis B.  You get hemodialysis treatment.  You take certain medicines for conditions, including cancer, organ transplantation, and autoimmune conditions. Hepatitis C  Blood testing is recommended for:  Everyone born from 1945 through 1965.  Anyone with known risk factors for hepatitis C. Sexually transmitted infections (STIs)  You should be screened for sexually transmitted infections (STIs) including gonorrhea and chlamydia if:  You are sexually active and are younger than 55 years of age.  You are older than 55 years of age and your health care provider tells you that you are at risk for this type of infection.  Your sexual activity has changed since you were last screened and you are at an increased risk for chlamydia or gonorrhea. Ask your health care provider if you are at risk.  If you do not have HIV, but are at risk, it may be recommended that you take a prescription medicine daily to prevent HIV infection. This is called pre-exposure prophylaxis (PrEP). You are considered at risk if:  You are  sexually active and do not regularly use condoms or know the HIV status of your partner(s).  You take drugs by injection.  You are sexually active with a partner who has HIV. Talk with your health care provider about whether you are at high risk of being infected with HIV. If you choose to begin PrEP, you should first be tested for HIV. You should then be tested every 3 months for as long as you are   taking PrEP.  PREGNANCY   If you are premenopausal and you may become pregnant, ask your health care provider about preconception counseling.  If you may become pregnant, take 400 to 800 micrograms (mcg) of folic acid every day.  If you want to prevent pregnancy, talk to your health care provider about birth control (contraception). OSTEOPOROSIS AND MENOPAUSE   Osteoporosis is a disease in which the bones lose minerals and strength with aging. This can result in serious bone fractures. Your risk for osteoporosis can be identified using a bone density scan.  If you are 65 years of age or older, or if you are at risk for osteoporosis and fractures, ask your health care provider if you should be screened.  Ask your health care provider whether you should take a calcium or vitamin D supplement to lower your risk for osteoporosis.  Menopause may have certain physical symptoms and risks.  Hormone replacement therapy may reduce some of these symptoms and risks. Talk to your health care provider about whether hormone replacement therapy is right for you.  HOME CARE INSTRUCTIONS   Schedule regular health, dental, and eye exams.  Stay current with your immunizations.   Do not use any tobacco products including cigarettes, chewing tobacco, or electronic cigarettes.  If you are pregnant, do not drink alcohol.  If you are breastfeeding, limit how much and how often you drink alcohol.  Limit alcohol intake to no more than 1 drink per day for nonpregnant women. One drink equals 12 ounces of beer, 5  ounces of wine, or 1 ounces of hard liquor.  Do not use street drugs.  Do not share needles.  Ask your health care provider for help if you need support or information about quitting drugs.  Tell your health care provider if you often feel depressed.  Tell your health care provider if you have ever been abused or do not feel safe at home.   This information is not intended to replace advice given to you by your health care provider. Make sure you discuss any questions you have with your health care provider.   Document Released: 08/09/2010 Document Revised: 02/14/2014 Document Reviewed: 12/26/2012 Elsevier Interactive Patient Education 2016 [employer] Inc[employer]  

## 2015-11-25 NOTE — Progress Notes (Signed)
Patient ID: Rebecca [name], female   DOB: Oct 11, 1960, 55 y.o.   MRN: [id number]   Subjective:   Rebecca [name] is a 55 y.o. female here for a complete physical exam  Here with her husband who stepped out of the room for questions about abuse, exam USPSTF grade A and B recommendations Alcohol: no Depression: Depression screen [facility] 2/9 11/25/2015 08/25/2015 10/24/2014 06/09/2011  Decreased Interest 0 0 0 0  Down, Depressed, Hopeless 0 0 0 0  PHQ - 2 Score 0 0 0 0  Some recent data might be hidden  no SI/HI  Hypertension: excellent control Obesity: lost 7 pounds since last visit; eating health, good portions Tobacco use: n/a HIV, hep B, hep C: test HIV, hep C STD testing and prevention (chl/gon/syphilis): already tested she says Lipids: check Glucose: check Colorectal cancer: 2014; 10 year pass Breast cancer: last mammo Feb; sees Dr. Grayland Ormond, breast exams done there BRCA gene screening: breast cancer in grandmother, aunt (maternal) Intimate partner violence: no Cervical cancer screening: will try another day Lung cancer: n/a Osteoporosis: no Fall prevention/vitamin D: fall precautions at home; no grab bars (suggested); bedside commode; one throw rug Aspirin: suggested 81 mg Diet: not large portions; getting turnip greens, broccoli, multivitamin; not much red meat, no much cheese; avoiding hot dogs Exercise: does home, home PT and OT; walked down 27 foot ramp Skin cancer: she wants to check two moles, one on the back, under left breast   Past Medical History:  Diagnosis Date  . Aortic valve disease    Mild AS / AI - most recent Echo demonstrated tricuspid aortic valve.  . Bacterial endocarditis    History of .  Marland Kitchen Bilateral lower extremity edema    Noncardiac.  Chronic. LE Venous dopplers - negative for DVT.; Echocardiogram January 2016: Normal EF with normal wall motion and valve function. Only grade 1 diastolic dysfunction. EF 60-65%. Mild MR  . Breast cancer ([place]) 12-31-13   Right breast, 12:00, 1.5 cm, T1c,N0 invasive mammary carcinoma, triple negative. --> Rx with Chemo  . Cervical stenosis of spine   . Herpes zoster   . IBS (irritable bowel syndrome)   . Lymphedema    has legs wrapped at [facility]  . Multiple sclerosis ([place]) 2001   Walks from room to room @ home; but Wheelchair when going out.  Marland Kitchen Neuromuscular disorder ([place])    MS  . Seizures ([place])    Takes Keppra  . Syncope and collapse    Past Surgical History:  Procedure Laterality Date  . ANKLE SURGERY     Left  . ANKLE SURGERY    . ANTERIOR CERVICAL DECOMP/DISCECTOMY FUSION  11/17/2011   Procedure: ANTERIOR CERVICAL DECOMPRESSION/DISCECTOMY FUSION 2 LEVELS;  Surgeon: Erline Levine, MD;  Location: [facility];  Service: Neurosurgery;  Laterality: N/A;  Cervical Five-Six Six-Seven Anterior cervical decompression/diskectomy/fusion  . BREAST BIOPSY Right 12-31-13   invasive mammary  . BREAST SURGERY Right 02/03/2014   Right simple mastectomy with sentinel node biopsy.  . CHOLECYSTECTOMY    . COLONOSCOPY  2014  . Lower extremity venous Dopplers  Feb 27, 2013   No LE DVT  . MASTECTOMY    . Port a cath insertion Right 01/19/2010  . PORT-A-CATH REMOVAL     right  . PORT-A-CATH REMOVAL Right 09/03/2013   Procedure: REMOVAL PORT-A-CATH;  Surgeon: Conrad Quinhagak, MD;  Location: [facility];  Service: Vascular;  Laterality: Right;  . TRANSTHORACIC ECHOCARDIOGRAM  03/2013; 02/2014   a) Normal  LV size and function with EF 60-65%.; Cannot exclude bicuspid aortic valve with mild AS and mild AI.; b) Normal EF with normal wall motion and valve function x Mild MR. G2 DD. EF 60-65%. Tricuspid AoV  . UPPER GI ENDOSCOPY  2014   Family History  Problem Relation Age of Onset  . Cancer Father     skin  . Heart disease Father   . Heart attack Father     heart attack in his 59's  . Breast cancer Maternal Aunt 60  . Breast cancer Maternal Grandmother 104  . Thyroid disease Sister   . Ovarian cancer Cousin    Social History   Substance Use Topics  . Smoking status: Never Smoker  . Smokeless tobacco: Never Used  . Alcohol use No   Review of Systems  Constitutional: Negative for unexpected weight change (intentional).  HENT: Negative for hearing loss.   Eyes: Negative for visual disturbance.  Respiratory: Negative for shortness of breath and wheezing.   Cardiovascular: Negative for chest pain, palpitations and leg swelling.  Gastrointestinal: Negative for blood in stool and constipation.  Endocrine: Negative for cold intolerance, heat intolerance, polydipsia and polyuria.  Genitourinary: Negative for dysuria, pelvic pain and vaginal bleeding.  Allergic/Immunologic: Negative for food allergies.  Neurological: Positive for weakness (legs from MS). Negative for tremors, seizures and numbness.  Hematological: Negative for adenopathy. Does not bruise/bleed easily.    Objective:   Vitals:   11/25/15 1118  BP: 110/62  Pulse: 69  Resp: 16  Temp: 97.9 F (36.6 C)  TempSrc: Oral  SpO2: 98%  Weight: 170 lb (77.1 kg)  Height: '5\' 4"'$  (1.626 m)   Body mass index is 29.18 kg/m. Wt Readings from Last 3 Encounters:  11/25/15 170 lb (77.1 kg)  08/06/15 177 lb (80.3 kg)  08/05/15 169 lb 15.6 oz (77.1 kg)   Physical Exam  Constitutional: She appears well-developed and well-nourished.  HENT:  Head: Normocephalic and atraumatic.  Right Ear: Hearing, tympanic membrane, external ear and ear canal normal.  Left Ear: Hearing, tympanic membrane, external ear and ear canal normal.  Eyes: Conjunctivae and EOM are normal. Right eye exhibits no hordeolum. Left eye exhibits no hordeolum. No scleral icterus.  Neck: Carotid bruit is not present. No thyromegaly present.  Cardiovascular: Normal rate, regular rhythm, S1 normal, S2 normal and normal heart sounds.   No extrasystoles are present.  Pulmonary/Chest: Effort normal and breath sounds normal. No respiratory distress.  Status post RIGHT mastectomy  Abdominal: Soft.  Normal appearance and bowel sounds are normal. She exhibits no distension, no abdominal bruit, no pulsatile midline mass and no mass. There is no hepatosplenomegaly. There is no tenderness. No hernia.  Musculoskeletal: She exhibits no edema.  Lymphadenopathy:       Head (right side): No submandibular adenopathy present.       Head (left side): No submandibular adenopathy present.    She has no cervical adenopathy.    She has no axillary adenopathy.  Neurological: She is alert. She displays no tremor. No cranial nerve deficit. Gait normal.  Reflex Scores:      Patellar reflexes are 2+ on the right side and 2+ on the left side. Weakness of lower extremities (secondary to MS)  Skin: Skin is warm and dry. No bruising and no ecchymosis noted. No cyanosis. No pallor.  Very dry  Psychiatric: Her speech is normal and behavior is normal. Thought content normal. Her mood appears not anxious. She does not exhibit a depressed mood.  Assessment/Plan:   Problem List Items Addressed This Visit      Other   Preventative health care - Primary    USPSTF grade A and B recommendations reviewed with patient; age-appropriate recommendations, preventive care, screening tests, etc discussed and encouraged; healthy living encouraged; see AVS for patient education given to patient      Relevant Orders   Lipid panel (Completed)   CBC with Differential/Platelet (Completed)   COMPLETE METABOLIC PANEL WITH GFR (Completed)   TSH (Completed)   Encounter for screening for cervical cancer     Refer to GYN who has table/equipment to meet her needs, unable to get up onto table for pap smear today due to lower extremity weakness from MS      Relevant Orders   Ambulatory referral to Gynecology    Other Visit Diagnoses    Needs flu shot       Relevant Orders   Flu Vaccine QUAD 36+ mos PF IM (Fluarix & Fluzone Quad PF) (Completed)       No orders of the defined types were placed in this encounter.  Orders  Placed This Encounter  Procedures  . Flu Vaccine QUAD 36+ mos PF IM (Fluarix & Fluzone Quad PF)  . Lipid panel  . CBC with Differential/Platelet  . COMPLETE METABOLIC PANEL WITH GFR  . TSH  . Ambulatory referral to Gynecology    Referral Priority:   Routine    Referral Type:   Consultation    Referral Reason:   Specialty Services Required    Requested Specialty:   Gynecology    Number of Visits Requested:   1    Follow up plan: Return in about 1 year (around 11/24/2016) for complete physical.  An After Visit Summary was printed and given to the patient.

## 2015-11-25 NOTE — Progress Notes (Signed)
BP 110/62 (BP Location: Left Arm, Patient Position: Sitting, Cuff Size: Large)   Pulse 69   Temp 97.9 F (36.6 C) (Oral)   Resp 16   Ht 5\' 4"  (1.626 m)   Wt 170 lb (77.1 kg)   SpO2 98%   BMI 29.18 kg/m    Subjective:    Patient ID: Rebecca [name], female    DOB: January 06, 1961, 55 y.o.   MRN: [id number]  HPI: Rebecca [name] is a 55 y.o. female  Chief Complaint  Patient presents with  . Annual Exam    Pap     Depression screen [facility] 2/9 11/25/2015 08/25/2015 10/24/2014 06/09/2011  Decreased Interest 0 0 0 0  Down, Depressed, Hopeless 0 0 0 0  PHQ - 2 Score 0 0 0 0  Some recent data might be hidden    No flowsheet data found.  Relevant past medical, surgical, family and social history reviewed Past Medical History:  Diagnosis Date  . Aortic valve disease    Mild AS / AI - most recent Echo demonstrated tricuspid aortic valve.  . Bacterial endocarditis    History of .  Marland Kitchen Bilateral lower extremity edema    Noncardiac.  Chronic. LE Venous dopplers - negative for DVT.; Echocardiogram January 2016: Normal EF with normal wall motion and valve function. Only grade 1 diastolic dysfunction. EF 60-65%. Mild MR  . Breast cancer ([place]) 12-31-13   Right breast, 12:00, 1.5 cm, T1c,N0 invasive mammary carcinoma, triple negative. --> Rx with Chemo  . Cervical stenosis of spine   . Herpes zoster   . IBS (irritable bowel syndrome)   . Lymphedema    has legs wrapped at [facility]  . Multiple sclerosis ([place]) 2001   Walks from room to room @ home; but Wheelchair when going out.  Marland Kitchen Neuromuscular disorder ([place])    MS  . Seizures ([place])    Takes Keppra  . Syncope and collapse    Past Surgical History:  Procedure Laterality Date  . ANKLE SURGERY     Left  . ANKLE SURGERY    . ANTERIOR CERVICAL DECOMP/DISCECTOMY FUSION  11/17/2011   Procedure: ANTERIOR CERVICAL DECOMPRESSION/DISCECTOMY FUSION 2 LEVELS;  Surgeon: Erline Levine, MD;  Location: [facility];  Service: Neurosurgery;  Laterality: N/A;   Cervical Five-Six Six-Seven Anterior cervical decompression/diskectomy/fusion  . BREAST BIOPSY Right 12-31-13   invasive mammary  . BREAST SURGERY Right 02/03/2014   Right simple mastectomy with sentinel node biopsy.  . CHOLECYSTECTOMY    . COLONOSCOPY  2014  . Lower extremity venous Dopplers  Feb 27, 2013   No LE DVT  . MASTECTOMY    . Port a cath insertion Right 01/19/2010  . PORT-A-CATH REMOVAL     right  . PORT-A-CATH REMOVAL Right 09/03/2013   Procedure: REMOVAL PORT-A-CATH;  Surgeon: Conrad [place], MD;  Location: [facility];  Service: Vascular;  Laterality: Right;  . TRANSTHORACIC ECHOCARDIOGRAM  03/2013; 02/2014   a) Normal LV size and function with EF 60-65%.; Cannot exclude bicuspid aortic valve with mild AS and mild AI.; b) Normal EF with normal wall motion and valve function x Mild MR. G2 DD. EF 60-65%. Tricuspid AoV  . UPPER GI ENDOSCOPY  2014   Family History  Problem Relation Age of Onset  . Cancer Father     skin  . Heart disease Father   . Heart attack Father     heart attack in his 73's  . Breast cancer Maternal Aunt 60  .  Breast cancer Maternal Grandmother 77  . Thyroid disease Sister   . Ovarian cancer Cousin    Social History  Substance Use Topics  . Smoking status: Never Smoker  . Smokeless tobacco: Never Used  . Alcohol use No    Interim medical history since last visit reviewed. Allergies and medications reviewed  Review of Systems Per HPI unless specifically indicated above     Objective:    BP 110/62 (BP Location: Left Arm, Patient Position: Sitting, Cuff Size: Large)   Pulse 69   Temp 97.9 F (36.6 C) (Oral)   Resp 16   Ht 5\' 4"  (1.626 m)   Wt 170 lb (77.1 kg)   SpO2 98%   BMI 29.18 kg/m   Wt Readings from Last 3 Encounters:  11/25/15 170 lb (77.1 kg)  08/06/15 177 lb (80.3 kg)  08/05/15 169 lb 15.6 oz (77.1 kg)    Physical Exam  Results for orders placed or performed in visit on 08/06/15  CBC with Differential/Platelet  Result Value  Ref Range   WBC 3.6 3.4 - 10.8 x10E3/uL   RBC 3.62 (L) 3.77 - 5.28 x10E6/uL   Hemoglobin 10.8 (L) 11.1 - 15.9 g/dL   Hematocrit 32.5 (L) 34.0 - 46.6 %   MCV 90 79 - 97 fL   MCH 29.8 26.6 - 33.0 pg   MCHC 33.2 31.5 - 35.7 g/dL   RDW 13.3 12.3 - 15.4 %   Platelets 123 (L) 150 - 379 x10E3/uL   Neutrophils 61 %   Lymphs 29 %   Monocytes 7 %   Eos 3 %   Basos 0 %   Neutrophils Absolute 2.2 1.4 - 7.0 x10E3/uL   Lymphocytes Absolute 1.0 0.7 - 3.1 x10E3/uL   Monocytes Absolute 0.3 0.1 - 0.9 x10E3/uL   EOS (ABSOLUTE) 0.1 0.0 - 0.4 x10E3/uL   Basophils Absolute 0.0 0.0 - 0.2 x10E3/uL   Immature Granulocytes 0 %   Immature Grans (Abs) 0.0 0.0 - 0.1 x10E3/uL  Comprehensive metabolic panel  Result Value Ref Range   Glucose 101 (H) 65 - 99 mg/dL   BUN 16 6 - 24 mg/dL   Creatinine, Ser 0.51 (L) 0.57 - 1.00 mg/dL   GFR calc non Af Amer 109 >59 mL/min/1.73   GFR calc Af Amer 125 >59 mL/min/1.73   BUN/Creatinine Ratio 31 (H) 9 - 23   Sodium 142 134 - 144 mmol/L   Potassium 4.1 3.5 - 5.2 mmol/L   Chloride 102 96 - 106 mmol/L   CO2 35 (H) 18 - 29 mmol/L   Calcium 9.1 8.7 - 10.2 mg/dL   Total Protein 6.5 6.0 - 8.5 g/dL   Albumin 4.6 3.5 - 5.5 g/dL   Globulin, Total 1.9 1.5 - 4.5 g/dL   Albumin/Globulin Ratio 2.4 (H) 1.2 - 2.2   Bilirubin Total 0.2 0.0 - 1.2 mg/dL   Alkaline Phosphatase 76 39 - 117 IU/L   AST 24 0 - 40 IU/L   ALT 23 0 - 32 IU/L  VITAMIN D 25 Hydroxy (Vit-D Deficiency, Fractures)  Result Value Ref Range   Vit D, 25-Hydroxy 43.1 30.0 - 100.0 ng/mL      Assessment & Plan:   Problem List Items Addressed This Visit    None    Visit Diagnoses    Needs flu shot    -  Primary   Relevant Orders   Flu Vaccine QUAD 36+ mos PF IM (Fluarix & Fluzone Quad PF)       Follow  up plan: No Follow-up on file.  An after-visit summary was printed and given to the patient at [place].  Please see the patient instructions which may contain other information and recommendations  beyond what is mentioned above in the assessment and plan.  No orders of the defined types were placed in this encounter.   Orders Placed This Encounter  Procedures  . Flu Vaccine QUAD 36+ mos PF IM (Fluarix & Fluzone Quad PF)

## 2015-11-25 NOTE — Assessment & Plan Note (Signed)
USPSTF grade A and B recommendations reviewed with patient; age-appropriate recommendations, preventive care, screening tests, etc discussed and encouraged; healthy living encouraged; see AVS for patient education given to patient  

## 2015-11-27 DIAGNOSIS — G40909 Epilepsy, unspecified, not intractable, without status epilepticus: Secondary | ICD-10-CM | POA: Diagnosis not present

## 2015-11-27 DIAGNOSIS — C50911 Malignant neoplasm of unspecified site of right female breast: Secondary | ICD-10-CM | POA: Diagnosis not present

## 2015-11-27 DIAGNOSIS — Z124 Encounter for screening for malignant neoplasm of cervix: Secondary | ICD-10-CM | POA: Insufficient documentation

## 2015-11-27 DIAGNOSIS — G35 Multiple sclerosis: Secondary | ICD-10-CM | POA: Diagnosis not present

## 2015-11-27 DIAGNOSIS — M4802 Spinal stenosis, cervical region: Secondary | ICD-10-CM | POA: Diagnosis not present

## 2015-11-27 DIAGNOSIS — K589 Irritable bowel syndrome without diarrhea: Secondary | ICD-10-CM | POA: Diagnosis not present

## 2015-11-27 DIAGNOSIS — I890 Lymphedema, not elsewhere classified: Secondary | ICD-10-CM | POA: Diagnosis not present

## 2015-11-27 NOTE — Assessment & Plan Note (Signed)
Refer to GYN who has table/equipment to meet her needs, unable to get up onto table for pap smear today due to lower extremity weakness from MS

## 2015-11-30 ENCOUNTER — Ambulatory Visit (INDEPENDENT_AMBULATORY_CARE_PROVIDER_SITE_OTHER): Payer: Medicare Other | Admitting: Diagnostic Neuroimaging

## 2015-11-30 ENCOUNTER — Encounter: Payer: Self-pay | Admitting: Diagnostic Neuroimaging

## 2015-11-30 VITALS — BP 112/67 | HR 68

## 2015-11-30 DIAGNOSIS — G40909 Epilepsy, unspecified, not intractable, without status epilepticus: Secondary | ICD-10-CM

## 2015-11-30 DIAGNOSIS — R5383 Other fatigue: Secondary | ICD-10-CM

## 2015-11-30 DIAGNOSIS — M62838 Other muscle spasm: Secondary | ICD-10-CM | POA: Diagnosis not present

## 2015-11-30 DIAGNOSIS — G35 Multiple sclerosis: Secondary | ICD-10-CM

## 2015-11-30 MED ORDER — AMANTADINE HCL 100 MG PO CAPS: 100.0000 mg | ORAL_CAPSULE | Freq: Three times a day (TID) | ORAL | 12 refills | Status: DC

## 2015-11-30 MED ORDER — BACLOFEN 10 MG PO TABS: 10.0000 mg | ORAL_TABLET | Freq: Three times a day (TID) | ORAL | 12 refills | Status: DC

## 2015-11-30 MED ORDER — LEVETIRACETAM 500 MG PO TABS: 500.0000 mg | ORAL_TABLET | Freq: Two times a day (BID) | ORAL | 4 refills | Status: DC

## 2015-11-30 NOTE — Progress Notes (Signed)
[facility]  PATIENT: Rebecca [name] DOB: 1960-04-05  REFERRING CLINICIAN:  HISTORY FROM: patient, husband REASON FOR VISIT: follow up   HISTORICAL  CHIEF COMPLAINT:  Chief Complaint  Patient presents with  . Multiple Sclerosis    rm 6, husband- Don, receiving PT, OT at home now, has handicap ramp into home  . Follow-up    3 month    HISTORY OF PRESENT ILLNESS:   UPDATE 11/30/15: Since last visit , doing well. HA are improved. MS stable. Baclofen, LEV, amantadine, rebif working. No seizures.   UPDATE 08/06/15: Since last visit, overall stable. MS sxs are stable. However having new headaches (severe, 10 days out of last 1 month, eyes, no nausea/vomiting, positive phonophobia). Never had these kind of headaches before. Headaches are better in last few days.   UPDATE 03/23/15: Since last visit, doing well. Back on rebif. Ready to try PT evaluation again. Tolerating meds.   UPDATE 09/16/14: Since last visit, completed breast CA treatments (adriamycin, cytoxan, taxol; couldn't complete the taxol cycles). Now ready for restarting MS treatments. No focal flares of MS, but had generalized slowness, fatigue, muscle spasms.   UPDATE 02/13/14: Since last visit, has been diagnosed with triple negative breast CA, s/p right mastectomy. Now planning to have chemotherapy. No new neuro symptoms. Tolerating rebif.   UPDATE 11/13/13: Since last visit, doing well. Getting wrap / lymphedema therapy via Amy (OT) at [facility] with great results. Significant improvement. Tolerating rebif. No new lesions or symptoms.  UPDATE 08/14/13: Since last visit, has continued on rebif. BLE edema still a problem. Saw cardiology, tried diuretic, and also trying wrap/massage therapy next week. Walking and mobility gradually worsening.   UPDATE 02/13/13 (LL): The patient returns for followup visit. Since last visit she states that she has seen Dr. Katy Fitch and that she needs new prescription eyeglasses for vision  problems that the double vision resolved. She is tolerating rebif. Her biggest concern is that her bilateral leg edema is making it difficult for her to move her legs or ambulate. She is not consistently wearing her TED hose. She denies any shortness of breath, but is mostly wheelchair-bound. She states that she feels like she is progressively getting weaker. She asked for information about Tecfidera. She said she had a cardiology consultation in the past when she started Gilenya, but that Dr. has since retired. She states she was diagnosed with aortic insufficiency. Her urgency and frequency of urination is getting worse, she asked if she can be increased on her medication.   UPDATE 11/07/12: Since last visit, doing well, until 1 month ago and developed fluctuating double vision/blurred vision. Affects right greater than left eye. It is present even if she closes the right or left eyes.   UPDATE 04/20/12: Since last visit, has come off gabapentin. BLE swelling stable. S/p ACDF surg per Dr. Vertell Limber and doing well. Tolerating rebif. Has new PCP and also getting PT. Few days ago, developed some numbness in right hand, similar to pre-op symptoms which improved post-op. No neck pain.   UPDATE 01/10/12: Noticed worsening swelling to bilateral lower extremities since 01/04/12. She is also having throbbing pain to shins. Difficulty with wearing sneakers. Unable to use leg compression machine. Denies shortness of breath.   UPDATE 10/27/11: Having surgery on c-spine fusion on November 17, 2011. Concerned about recovery time and her ability to perform ADL's. Lives with aunt. Wanting to know if she needs to have someone come to home or stay at rehab. Intermittent  blurred vision las 3 weeks.   UPDATE 07/27/11: Doing better with shingles and post-herpetic neuralgia pain; on gralise. Now with worsening RLE weakness, fatigue, vision changes, speech diff. Discussed tx options.   UPDATE 06/02/11: On 05/27/11 developed right lower  tooth pain; 4/20 developed blisters and rash on right chin and jaw. 4/22 went to dentist, diagnosed with tooth infx and given amoxicillin. Rash more significant. 06/01/11, went to dermatologist and dx'd possible shingles. Given valtrex and started on 4/24. Lesions still oozing. Has right ear and right facial pain, severe. No eye pain.   UPDATE 03/02/10: Patient was getting worked up for first dose observation for Gilenya, however on 02/28/2011, patient had a suspected seizure. She was in the bathroom at home, stood up and without warning have loss of consciousness. Her mother found her on the floor groaning with some twitching movements of her upper extremities, and her legs outstretched in front of her. No incontinence or tongue biting. It took approximately 20-30 minutes for her to wake up. She was amnestic events until the next day.  In retrospect, patient had 4 or 5 episodes of syncope versus seizure in the early 1980s. She was never on antiseizure medication. Patient was also on Ampyra since past 2 years.   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 Last dose tysabri on 10/29/10. She would like to switch to [place]. No MS flares. Some increased fatigue. Also asking if amantadine can be stopped to see if peripheral edema improves.   PRIOR HPI (11/02/10): 55 year old right-handed female with history of multiple sclerosis. Here for transfer of care from [place]. 2001 - patient developed left leg numbness and weakness, followed by right facial weakness and slurred speech. She was evaluated with MRI of the brain, diagnosed with multiple sclerosis and started on Copaxone. She did not have lumbar puncture or spinal cord MRI. 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 She thinks she was off of Copaxone for approximately 2 years. 2008-9 - patient was evaluated at [facility] and started on Tysabri. She thinks she has been on Tysabri for approximately 3-1/2 years. Last  dose was last week at [facility]. She continues to decline in terms of mobility. She is to walk using a cane, then started using a walker, now uses a transport chair occasionally. Patient continues to have intermittent right facial twitching and numbness. She is on amantadine, baclofen, Amprya, and oxybutynin for MS symptoms.    REVIEW OF SYSTEMS: Full 14 system review of systems performed and negative except leg swelling murmur aching muscles restless legs.    ALLERGIES: Allergies  Allergen Reactions  . Fentanyl Nausea And Vomiting and Nausea Only    vomiting Was given in PACU x3 each time patient got sick vomiting Was given in PACU x3 each time patient got sick  . Sulfa Antibiotics Hives and Other (See Comments)    Light headed, over heated    HOME MEDICATIONS: Outpatient Medications Prior to Visit  Medication Sig Dispense Refill  . baclofen (LIORESAL) 10 MG tablet Take 1-2 tablets (10-20 mg total) by mouth 3 (three) times daily. 150 tablet 12  . Cholecalciferol (VITAMIN D3) 2000 UNITS capsule Take 2,000 Units by mouth daily.    . Cranberry 1000 MG CAPS Take by mouth daily.    . fentaNYL (DURAGESIC - DOSED MCG/HR) 50 MCG/HR Place 1 patch (50 mcg total) onto the skin every 3 (three) days. 10 patch 0  . GLUCOSAMINE-CHONDROITIN PO Take 1  tablet by mouth 3 (three) times daily. STRENGTH: 1500-1200    . ibuprofen (ADVIL,MOTRIN) 600 MG tablet TAKE 1 TABLET (600 MG TOTAL) BY MOUTH EVERY 6 (SIX) HOURS AS NEEDED. 60 tablet 2  . interferon beta-1a (REBIF) 44 MCG/0.5ML SOSY injection Inject 44 mcg into the skin 3 (three) times a week.    . levETIRAcetam (KEPPRA) 500 MG tablet Take 1 tablet (500 mg total) by mouth 2 (two) times daily. 180 tablet 4  . lidocaine-prilocaine (EMLA) cream Apply 1 application topically once. Apply to port then cover with saran wrap 1-2 hours before chemotherapy    . loratadine (CLARITIN) 10 MG tablet Take 10 mg by mouth daily.    . Misc Natural Products  (LEG VEIN & CIRCULATION) TABS Take by mouth daily.    . Multiple Vitamins-Minerals (MULTIVITAMIN PO) Take 1 tablet by mouth daily.    Marland Kitchen oxybutynin (DITROPAN-XL) 10 MG 24 hr tablet TAKE 2 TABLETS BY MOUTH DAILY 180 tablet 3  . Oxycodone HCl 10 MG TABS Take 1 tablet (10 mg total) by mouth every 6 (six) hours as needed. 120 tablet 0  . prochlorperazine (COMPAZINE) 10 MG tablet Take 1 tablet (10 mg total) by mouth every 6 (six) hours as needed. 30 tablet 1   No facility-administered medications prior to visit.     PAST MEDICAL HISTORY: Past Medical History:  Diagnosis Date  . Aortic valve disease    Mild AS / AI - most recent Echo demonstrated tricuspid aortic valve.  . Bacterial endocarditis    History of .  Marland Kitchen Bilateral lower extremity edema    Noncardiac.  Chronic. LE Venous dopplers - negative for DVT.; Echocardiogram January 2016: Normal EF with normal wall motion and valve function. Only grade 1 diastolic dysfunction. EF 60-65%. Mild MR  . Breast cancer ([place]) 12-31-13   Right breast, 12:00, 1.5 cm, T1c,N0 invasive mammary carcinoma, triple negative. --> Rx with Chemo  . Cervical stenosis of spine   . Herpes zoster   . IBS (irritable bowel syndrome)   . Lymphedema    has legs wrapped at [facility]  . Multiple sclerosis ([place]) 2001   Walks from room to room @ home; but Wheelchair when going out.  Marland Kitchen Neuromuscular disorder ([place])    MS  . Seizures ([place])    Takes Keppra  . Syncope and collapse     PAST SURGICAL HISTORY: Past Surgical History:  Procedure Laterality Date  . ANKLE SURGERY     Left  . ANKLE SURGERY    . ANTERIOR CERVICAL DECOMP/DISCECTOMY FUSION  11/17/2011   Procedure: ANTERIOR CERVICAL DECOMPRESSION/DISCECTOMY FUSION 2 LEVELS;  Surgeon: Erline Levine, MD;  Location: [facility];  Service: Neurosurgery;  Laterality: N/A;  Cervical Five-Six Six-Seven Anterior cervical decompression/diskectomy/fusion  . BREAST BIOPSY Right 12-31-13   invasive mammary  . BREAST SURGERY  Right 02/03/2014   Right simple mastectomy with sentinel node biopsy.  . CHOLECYSTECTOMY    . COLONOSCOPY  2014  . Lower extremity venous Dopplers  Feb 27, 2013   No LE DVT  . MASTECTOMY    . Port a cath insertion Right 01/19/2010  . PORT-A-CATH REMOVAL     right  . PORT-A-CATH REMOVAL Right 09/03/2013   Procedure: REMOVAL PORT-A-CATH;  Surgeon: Conrad Waterproof, MD;  Location: [place] [facility];  Service: Vascular;  Laterality: Right;  . TRANSTHORACIC ECHOCARDIOGRAM  03/2013; 02/2014   a) Normal LV size and function with EF 60-65%.; Cannot exclude bicuspid aortic valve with mild AS and mild AI.;  b) Normal EF with normal wall motion and valve function x Mild MR. G2 DD. EF 60-65%. Tricuspid AoV  . UPPER GI ENDOSCOPY  2014    FAMILY HISTORY: Family History  Problem Relation Age of Onset  . Cancer Father     skin  . Heart disease Father   . Heart attack Father     heart attack in his 24's  . Thyroid disease Sister   . Ovarian cancer Cousin   . Breast cancer Maternal Aunt 60  . Breast cancer Maternal Grandmother 74    SOCIAL HISTORY:  Social History   Social History  . Marital status: Married    Spouse name: don  . Number of children: 0  . Years of education: 12   Occupational History  . disability    Social History Main Topics  . Smoking status: Never Smoker  . Smokeless tobacco: Never Used  . Alcohol use No  . Drug use: No  . Sexual activity: Not Currently    Partners: Male   Other Topics Concern  . Not on file   Social History Narrative   She is married. Recently moved back to [place] after being in [place] for some time. She is accompanied by her husband and aunt.   Never smoked. Never used alcohol.     PHYSICAL EXAM  Vitals:   11/30/15 1147  BP: 112/67  Pulse: 68    Not recorded      There is no height or weight on file to calculate BMI.  GENERAL EXAM:  General: Patient is awake, alert and in no acute distress. Well developed and groomed.  Neck:  Neck is supple.  Cardiovascular: Heart is regular rate and rhythm with mild systolic murmur. PERIPHERAL EDEMA IN BLE.   Neurologic Exam  Mental Status: Awake, alert. Language is fluent and comprehension intact. Cranial Nerves: Pupils are equal and reactive to light. Visual fields are full to confrontation. Conjugate eye movements are full and symmetric --> SACCADIC BREAKDOWN OF SMOOTH PURSUIT. Facial sensation and strength are symmetric; EXCEPT LEFT NL FOLD SLIGHTLY DECR. Hearing is intact. Palate elevated symmetrically and uvula is midline. Shoulder shrug is symmetric. Tongue is midline.  Motor: Normal bulk and tone. Full strength in the upper extremities; BLE (1-2/5 prox, 3/5 distal), INCREASED TONE IN BLE. No pronator drift.  Sensory: Intact and symmetric to light touch.  Coordination: FTN SMOOTH Reflexes: Deep tendon reflexes in the upper and lower extremity are present and symmetric; TRACE AT KNEES AND ANKLES. PERIPHERAL EDEMA. Gait and Station: [place].     DIAGNOSTIC DATA (LABS, IMAGING, TESTING) - I reviewed patient records, labs, notes, testing and imaging myself where available.  Lab Results  Component Value Date   WBC 3.5 (L) 11/25/2015   HGB 12.6 11/25/2015   HCT 37.5 11/25/2015   MCV 89.9 11/25/2015   PLT 138 (L) 11/25/2015      Component Value Date/Time   NA 141 11/25/2015 1216   NA 142 08/06/2015 1615   NA 139 06/05/2014 0951   K 4.3 11/25/2015 1216   K 3.8 06/05/2014 0951   CL 103 11/25/2015 1216   CL 107 06/05/2014 0951   CO2 29 11/25/2015 1216   CO2 28 06/05/2014 0951   GLUCOSE 95 11/25/2015 1216   GLUCOSE 130 (H) 06/05/2014 0951   BUN 13 11/25/2015 1216   BUN 16 08/06/2015 1615   BUN 12 06/05/2014 0951   CREATININE 0.49 (L) 11/25/2015 1216   CALCIUM 9.6 11/25/2015 1216  CALCIUM 9.4 06/05/2014 0951   PROT 7.2 11/25/2015 1216   PROT 6.5 08/06/2015 1615   PROT 7.0 05/29/2014 0949   ALBUMIN 4.3 11/25/2015 1216   ALBUMIN 4.6 08/06/2015 1615    ALBUMIN 4.1 05/29/2014 0949   AST 25 11/25/2015 1216   AST 24 05/29/2014 0949   ALT 24 11/25/2015 1216   ALT 24 05/29/2014 0949   ALKPHOS 73 11/25/2015 1216   ALKPHOS 58 05/29/2014 0949   BILITOT 0.3 11/25/2015 1216   BILITOT 0.2 08/06/2015 1615   BILITOT 0.6 05/29/2014 0949   GFRNONAA >89 11/25/2015 1216   GFRAA >89 11/25/2015 1216   Lab Results  Component Value Date   CHOL 214 (H) 11/25/2015   No results found for: HGBA1C No results found for: VITAMINB12 Lab Results  Component Value Date   TSH 1.83 11/25/2015    I reviewed images myself and agree with interpretation. -VRP  08/21/13 MRI brain (with and without) demonstrating:  1. Multiple periventricular and subcortical chronic demyelinating plaques. Some of these are confluent. Some are hypointense on T1.  2. No acute plaques.  3. No significant change from MRI on 11/20/12.   08/21/13 MRI cervical spine (with and without) demonstrating:  1. At C2-3: disc bulging and facet hypertrophy with mild spinal stenosis and mild left foraminal stenosis.  2. At C5-6: uncovertebral joint hypertrophy with mild right foraminal stenosis.  3. Multiple chronic demyelinating plaques from C2 to T1. No abnormal enhancing lesions.  4. Compared to MRI from 10/04/11, there has been progression of degenerative spine disease at C2-3. Also there has been interval ACDF from C5-C7. No significant change in spinal cord lesions.  08/21/13 MRI thoracic spine (with and without) demonstrating:  1. Multiple spinal cord chronic demyelinating plaques from C7 to T12-L1.  2. No acute plaques.  3. ACDF at C5-C7.  4. Compared to prior MRI from 08/03/11, the ACDF is new. Overall no new demyelinating plaques.  09/30/14 MRI brain  - showing 2/flare foci in the right cervical hemisphere, pons, thalami and cerebral hemispheres in a pattern and configuration consistent with the diagnosis of multiple sclerosis. None of the foci enhances after contrast administration. There  are no acute findings. When compared to the MRI dated 08/21/2013, there has been no interval change.  10/07/15 MRI brain  - Moderately severe white matter changes consistent with multiple sclerosis are stable since the prior MRI. - Negative for metastatic disease.  No acute abnormality.     ASSESSMENT AND PLAN  55 y.o. year old female here with multiple sclerosis, initially on copaxone, then was on Tysabri in the past (3-1/2 years); then became JCV +. Last dose tysabri 10/29/10. Switched to [place] on 04/25/11.   Had brief seizure before starting gilenya, now on levetiracetam.   Then with right V2, V3 herpes zoster on 05/27/11. Gilenya stopped. Shingles resolved.  Then on rebif. Also s/p ACDF C5-6 (Oct 2013) for spinal stenosis.   Now with breast CA diagnosis, s/p right mastectomy, s/p adriamycin, cytoxan, taxol. Now treatments have been completed.   Now back on rebif (since Jan 2017).  Now with new onset headaches (June 2017). MRI brain unremarkable. HA now improving.    Dx:  Multiple sclerosis ([place])  Seizure disorder ([facility])  Muscle spasm  Other fatigue     PLAN:  - continue rebif - continue baclofen for muscle spasms (stable) - continue levetiracetam for seizure d/o (stable) - continue amantadine for fatigue (stable) - continue home Health PT evaluation  Meds ordered this encounter  Medications  . amantadine (SYMMETREL) 100 MG capsule    Sig: Take 1 capsule (100 mg total) by mouth 3 (three) times daily.    Dispense:  90 capsule    Refill:  12  . baclofen (LIORESAL) 10 MG tablet    Sig: Take 1-2 tablets (10-20 mg total) by mouth 3 (three) times daily.    Dispense:  150 tablet    Refill:  12  . levETIRAcetam (KEPPRA) 500 MG tablet    Sig: Take 1 tablet (500 mg total) by mouth 2 (two) times daily.    Dispense:  180 tablet    Refill:  4   Return in about 6 months (around 05/30/2016).    Penni Bombard, MD [id number], [id number] PM Certified in Neurology,  Neurophysiology and Neuroimaging  [facility] [address], [place], [place] [address] [phone]

## 2015-12-01 DIAGNOSIS — G40909 Epilepsy, unspecified, not intractable, without status epilepticus: Secondary | ICD-10-CM | POA: Diagnosis not present

## 2015-12-01 DIAGNOSIS — G35 Multiple sclerosis: Secondary | ICD-10-CM | POA: Diagnosis not present

## 2015-12-01 DIAGNOSIS — K589 Irritable bowel syndrome without diarrhea: Secondary | ICD-10-CM | POA: Diagnosis not present

## 2015-12-01 DIAGNOSIS — M4802 Spinal stenosis, cervical region: Secondary | ICD-10-CM | POA: Diagnosis not present

## 2015-12-01 DIAGNOSIS — C50911 Malignant neoplasm of unspecified site of right female breast: Secondary | ICD-10-CM | POA: Diagnosis not present

## 2015-12-01 DIAGNOSIS — I890 Lymphedema, not elsewhere classified: Secondary | ICD-10-CM | POA: Diagnosis not present

## 2015-12-02 DIAGNOSIS — C50911 Malignant neoplasm of unspecified site of right female breast: Secondary | ICD-10-CM | POA: Diagnosis not present

## 2015-12-02 DIAGNOSIS — K589 Irritable bowel syndrome without diarrhea: Secondary | ICD-10-CM | POA: Diagnosis not present

## 2015-12-02 DIAGNOSIS — M4802 Spinal stenosis, cervical region: Secondary | ICD-10-CM | POA: Diagnosis not present

## 2015-12-02 DIAGNOSIS — I890 Lymphedema, not elsewhere classified: Secondary | ICD-10-CM | POA: Diagnosis not present

## 2015-12-02 DIAGNOSIS — G40909 Epilepsy, unspecified, not intractable, without status epilepticus: Secondary | ICD-10-CM | POA: Diagnosis not present

## 2015-12-02 DIAGNOSIS — G35 Multiple sclerosis: Secondary | ICD-10-CM | POA: Diagnosis not present

## 2015-12-03 ENCOUNTER — Other Ambulatory Visit: Payer: Self-pay | Admitting: *Deleted

## 2015-12-03 DIAGNOSIS — C50911 Malignant neoplasm of unspecified site of right female breast: Secondary | ICD-10-CM | POA: Diagnosis not present

## 2015-12-03 DIAGNOSIS — I890 Lymphedema, not elsewhere classified: Secondary | ICD-10-CM | POA: Diagnosis not present

## 2015-12-03 DIAGNOSIS — G35 Multiple sclerosis: Secondary | ICD-10-CM | POA: Diagnosis not present

## 2015-12-03 DIAGNOSIS — K589 Irritable bowel syndrome without diarrhea: Secondary | ICD-10-CM | POA: Diagnosis not present

## 2015-12-03 DIAGNOSIS — G40909 Epilepsy, unspecified, not intractable, without status epilepticus: Secondary | ICD-10-CM | POA: Diagnosis not present

## 2015-12-03 DIAGNOSIS — M4802 Spinal stenosis, cervical region: Secondary | ICD-10-CM | POA: Diagnosis not present

## 2015-12-03 MED ORDER — FENTANYL 50 MCG/HR TD PT72: 50.0000 ug | MEDICATED_PATCH | TRANSDERMAL | 0 refills | Status: DC

## 2015-12-04 DIAGNOSIS — M4802 Spinal stenosis, cervical region: Secondary | ICD-10-CM | POA: Diagnosis not present

## 2015-12-04 DIAGNOSIS — G35 Multiple sclerosis: Secondary | ICD-10-CM | POA: Diagnosis not present

## 2015-12-04 DIAGNOSIS — C50911 Malignant neoplasm of unspecified site of right female breast: Secondary | ICD-10-CM | POA: Diagnosis not present

## 2015-12-04 DIAGNOSIS — K589 Irritable bowel syndrome without diarrhea: Secondary | ICD-10-CM | POA: Diagnosis not present

## 2015-12-04 DIAGNOSIS — G40909 Epilepsy, unspecified, not intractable, without status epilepticus: Secondary | ICD-10-CM | POA: Diagnosis not present

## 2015-12-04 DIAGNOSIS — I890 Lymphedema, not elsewhere classified: Secondary | ICD-10-CM | POA: Diagnosis not present

## 2015-12-07 ENCOUNTER — Ambulatory Visit: Payer: Medicare Other | Admitting: Diagnostic Neuroimaging

## 2015-12-09 ENCOUNTER — Inpatient Hospital Stay: Payer: Medicare Other

## 2015-12-11 ENCOUNTER — Inpatient Hospital Stay: Payer: Medicare Other

## 2015-12-16 ENCOUNTER — Inpatient Hospital Stay: Payer: Medicare Other | Attending: Oncology

## 2015-12-16 ENCOUNTER — Other Ambulatory Visit: Payer: Self-pay | Admitting: *Deleted

## 2015-12-16 DIAGNOSIS — Z171 Estrogen receptor negative status [ER-]: Secondary | ICD-10-CM | POA: Insufficient documentation

## 2015-12-16 DIAGNOSIS — Z452 Encounter for adjustment and management of vascular access device: Secondary | ICD-10-CM | POA: Insufficient documentation

## 2015-12-16 DIAGNOSIS — Z95828 Presence of other vascular implants and grafts: Secondary | ICD-10-CM

## 2015-12-16 DIAGNOSIS — G35 Multiple sclerosis: Secondary | ICD-10-CM | POA: Insufficient documentation

## 2015-12-16 DIAGNOSIS — C50911 Malignant neoplasm of unspecified site of right female breast: Secondary | ICD-10-CM | POA: Insufficient documentation

## 2015-12-16 MED ORDER — SODIUM CHLORIDE 0.9% FLUSH
10.0000 mL | INTRAVENOUS | Status: DC | PRN
  Administered 2015-12-16: 10 mL via INTRAVENOUS
  Filled 2015-12-16: qty 10

## 2015-12-16 MED ORDER — PROCHLORPERAZINE MALEATE 10 MG PO TABS: 10.0000 mg | ORAL_TABLET | Freq: Four times a day (QID) | ORAL | 1 refills | Status: DC | PRN

## 2015-12-16 MED ORDER — HEPARIN SOD (PORK) LOCK FLUSH 100 UNIT/ML IV SOLN
INTRAVENOUS | Status: AC
  Filled 2015-12-16: qty 5

## 2015-12-16 MED ORDER — HEPARIN SOD (PORK) LOCK FLUSH 100 UNIT/ML IV SOLN
500.0000 [IU] | Freq: Once | INTRAVENOUS | Status: AC
  Administered 2015-12-16: 500 [IU] via INTRAVENOUS

## 2015-12-21 ENCOUNTER — Telehealth: Payer: Self-pay | Admitting: *Deleted

## 2015-12-21 MED ORDER — OXYCODONE HCL 10 MG PO TABS: 10.0000 mg | ORAL_TABLET | Freq: Four times a day (QID) | ORAL | 0 refills | Status: DC | PRN

## 2016-01-04 ENCOUNTER — Other Ambulatory Visit: Payer: Self-pay | Admitting: *Deleted

## 2016-01-04 MED ORDER — FENTANYL 50 MCG/HR TD PT72: 50.0000 ug | MEDICATED_PATCH | TRANSDERMAL | 0 refills | Status: DC

## 2016-01-06 NOTE — Telephone Encounter (Signed)
Medication refill

## 2016-01-15 ENCOUNTER — Telehealth: Payer: Self-pay | Admitting: *Deleted

## 2016-01-15 ENCOUNTER — Other Ambulatory Visit: Payer: Self-pay | Admitting: Family Medicine

## 2016-01-15 NOTE — Telephone Encounter (Signed)
Per Dr Grayland Ormond, do not refill this at this time. He will discuss meds with her at her appt on the 13th

## 2016-01-15 NOTE — Telephone Encounter (Signed)
Last creatinine and cbc reviewed; approved

## 2016-01-19 ENCOUNTER — Telehealth: Payer: Self-pay | Admitting: Diagnostic Neuroimaging

## 2016-01-19 NOTE — Telephone Encounter (Signed)
Per Jarrett Soho RN's note on 01/15/16 : Per Dr Grayland Ormond, do not refill this at this time. He will discuss meds with her at her appt on the 13th. This RN will wait until patient sees Dr Kennith Gain on 01/20/16. Will follow up to see if patient gets refill from Dr Kennith Gain. .  Dr Leta Baptist out of office until tomorrow, so unable to discuss with him today.

## 2016-01-19 NOTE — Telephone Encounter (Signed)
Pt request refill for prochlorperazine (COMPAZINE) 10 MG tablet sent to [facility]. Pt advised Dr Grayland Ormond was filling this med for her. Pls see his office note 01/15/16. I advised her Dr Grayland Ormond did not want to refill medication at this time. She said their office told her to call her PCP or neurologist to get it filled. She advised her next appt with him is 02/02/16.

## 2016-01-20 ENCOUNTER — Inpatient Hospital Stay: Payer: Medicare Other

## 2016-01-20 ENCOUNTER — Inpatient Hospital Stay: Payer: Medicare Other | Admitting: Oncology

## 2016-01-20 NOTE — Telephone Encounter (Signed)
Defer to PCP or Dr. Grayland Ormond. -VRP

## 2016-01-20 NOTE — Telephone Encounter (Signed)
Advised patient to call her PCP or Dr Grayland Ormond for compazine refill. She verbalized understanding, appreciation.

## 2016-01-26 ENCOUNTER — Encounter: Payer: Medicare Other | Admitting: Obstetrics and Gynecology

## 2016-01-26 ENCOUNTER — Other Ambulatory Visit: Payer: Medicare Other

## 2016-01-26 ENCOUNTER — Telehealth: Payer: Self-pay | Admitting: *Deleted

## 2016-01-26 ENCOUNTER — Ambulatory Visit: Payer: Medicare Other | Admitting: Oncology

## 2016-01-26 MED ORDER — OXYCODONE HCL 10 MG PO TABS: 10.0000 mg | ORAL_TABLET | Freq: Four times a day (QID) | ORAL | 0 refills | Status: DC | PRN

## 2016-01-26 NOTE — Telephone Encounter (Signed)
Refill for Oxycodone completed. Patient notified.

## 2016-02-02 ENCOUNTER — Inpatient Hospital Stay: Payer: Medicare Other

## 2016-02-02 ENCOUNTER — Inpatient Hospital Stay: Payer: Medicare Other | Attending: Oncology | Admitting: Oncology

## 2016-02-02 VITALS — BP 122/79 | HR 68 | Temp 98.0°F | Resp 18

## 2016-02-02 DIAGNOSIS — I890 Lymphedema, not elsewhere classified: Secondary | ICD-10-CM | POA: Diagnosis not present

## 2016-02-02 DIAGNOSIS — Z171 Estrogen receptor negative status [ER-]: Secondary | ICD-10-CM | POA: Diagnosis not present

## 2016-02-02 DIAGNOSIS — R110 Nausea: Secondary | ICD-10-CM | POA: Diagnosis not present

## 2016-02-02 DIAGNOSIS — Z853 Personal history of malignant neoplasm of breast: Secondary | ICD-10-CM | POA: Insufficient documentation

## 2016-02-02 DIAGNOSIS — K589 Irritable bowel syndrome without diarrhea: Secondary | ICD-10-CM | POA: Insufficient documentation

## 2016-02-02 DIAGNOSIS — G35 Multiple sclerosis: Secondary | ICD-10-CM

## 2016-02-02 DIAGNOSIS — C50911 Malignant neoplasm of unspecified site of right female breast: Secondary | ICD-10-CM

## 2016-02-02 DIAGNOSIS — Z95828 Presence of other vascular implants and grafts: Secondary | ICD-10-CM

## 2016-02-02 DIAGNOSIS — Z9013 Acquired absence of bilateral breasts and nipples: Secondary | ICD-10-CM | POA: Diagnosis not present

## 2016-02-02 DIAGNOSIS — G629 Polyneuropathy, unspecified: Secondary | ICD-10-CM | POA: Insufficient documentation

## 2016-02-02 DIAGNOSIS — Z79899 Other long term (current) drug therapy: Secondary | ICD-10-CM | POA: Diagnosis not present

## 2016-02-02 DIAGNOSIS — R51 Headache: Secondary | ICD-10-CM | POA: Insufficient documentation

## 2016-02-02 MED ORDER — SODIUM CHLORIDE 0.9% FLUSH
10.0000 mL | INTRAVENOUS | Status: DC | PRN
  Administered 2016-02-02: 10 mL via INTRAVENOUS
  Filled 2016-02-02: qty 10

## 2016-02-02 MED ORDER — PROCHLORPERAZINE MALEATE 10 MG PO TABS: 10.0000 mg | ORAL_TABLET | Freq: Four times a day (QID) | ORAL | 5 refills | Status: DC | PRN

## 2016-02-02 MED ORDER — HEPARIN SOD (PORK) LOCK FLUSH 100 UNIT/ML IV SOLN
500.0000 [IU] | Freq: Once | INTRAVENOUS | Status: AC
  Administered 2016-02-02: 500 [IU] via INTRAVENOUS

## 2016-02-02 NOTE — Progress Notes (Signed)
Pt complains of occasional nausea and has been taking compazine which helps relieve nausea. Has recently finished prescription and requests refill.

## 2016-02-02 NOTE — Progress Notes (Signed)
[facility]  [phone]) [phone] [phone]) [phone]  ID: Rebecca [name] OB: 10-15-1960  MR#: [id number]  NOB#:[id number]  Patient Care Team: [name], MD as PCP - General (Family Medicine) Bobetta Lime, MD as Referring Physician [name], MD (General Surgery) Penni Bombard, MD as Consulting Physician (Neurology) Lloyd Huger, MD as Consulting Physician (Oncology) Leonie Man, MD as Consulting Physician (Cardiology)  CHIEF COMPLAINT:  Chief Complaint  Patient presents with  . Breast Cancer    INTERVAL HISTORY: Patient returns to clinic today for routine evaluation. She currently feels well and is asymptomatic. Her pain is currently well-controlled. Patient states her peripheral neuropathy continues to improve. Her lower extremity edema is also significantly improved. She complains of intermittent headache but feels it may be related to stopping drinking coffee.  She denies any chest pain or shortness of breath. She denies any nausea, vomiting, constipation, or diarrhea. She complains of urinary urgency, no other urinary complaints. Patient offers no further specific complaints.   REVIEW OF SYSTEMS:   Review of Systems  Constitutional: Negative for chills, fever and malaise/fatigue.  Respiratory: Negative for cough and shortness of breath.   Cardiovascular: Negative.  Negative for chest pain and leg swelling.  Gastrointestinal: Positive for nausea. Negative for abdominal pain, constipation, diarrhea and vomiting.  Genitourinary: Positive for urgency.  Neurological: Positive for headaches. Negative for weakness.  Psychiatric/Behavioral: Negative.  The patient is not nervous/anxious.     As per HPI. Otherwise, a complete review of systems is negative.  PAST MEDICAL HISTORY: Past Medical History:  Diagnosis Date  . Aortic valve disease    Mild AS / AI - most recent Echo demonstrated tricuspid aortic valve.  . Bacterial  endocarditis    History of .  Marland Kitchen Bilateral lower extremity edema    Noncardiac.  Chronic. LE Venous dopplers - negative for DVT.; Echocardiogram January 2016: Normal EF with normal wall motion and valve function. Only grade 1 diastolic dysfunction. EF 60-65%. Mild MR  . Breast cancer ([place]) 12-31-13   Right breast, 12:00, 1.5 cm, T1c,N0 invasive mammary carcinoma, triple negative. --> Rx with Chemo  . Cervical stenosis of spine   . Herpes zoster   . IBS (irritable bowel syndrome)   . Lymphedema    has legs wrapped at [facility]  . Multiple sclerosis ([place]) 2001   Walks from room to room @ home; but Wheelchair when going out.  Marland Kitchen Neuromuscular disorder ([place])    MS  . Seizures ([place])    Takes Keppra  . Syncope and collapse     PAST SURGICAL HISTORY: Past Surgical History:  Procedure Laterality Date  . ANKLE SURGERY     Left  . ANKLE SURGERY    . ANTERIOR CERVICAL DECOMP/DISCECTOMY FUSION  11/17/2011   Procedure: ANTERIOR CERVICAL DECOMPRESSION/DISCECTOMY FUSION 2 LEVELS;  Surgeon: Erline Levine, MD;  Location: [place] [facility] [facility];  Service: Neurosurgery;  Laterality: N/A;  Cervical Five-Six Six-Seven Anterior cervical decompression/diskectomy/fusion  . BREAST BIOPSY Right 12-31-13   invasive mammary  . BREAST SURGERY Right 02/03/2014   Right simple mastectomy with sentinel node biopsy.  . CHOLECYSTECTOMY    . COLONOSCOPY  2014  . Lower extremity venous Dopplers  Feb 27, 2013   No LE DVT  . MASTECTOMY    . Port a cath insertion Right 01/19/2010  . PORT-A-CATH REMOVAL     right  . PORT-A-CATH REMOVAL Right 09/03/2013   Procedure: REMOVAL PORT-A-CATH;  Surgeon: Conrad [place], MD;  Location:  [facility] OR;  Service: Vascular;  Laterality: Right;  . TRANSTHORACIC ECHOCARDIOGRAM  03/2013; 02/2014   a) Normal LV size and function with EF 60-65%.; Cannot exclude bicuspid aortic valve with mild AS and mild AI.; b) Normal EF with normal wall motion and valve function x Mild MR. G2 DD. EF 60-65%. Tricuspid AoV  .  UPPER GI ENDOSCOPY  2014    FAMILY HISTORY Family History  Problem Relation Age of Onset  . Cancer Father     skin  . Heart disease Father   . Heart attack Father     heart attack in his 25's  . Thyroid disease Sister   . Ovarian cancer Cousin   . Breast cancer Maternal Aunt 60  . Breast cancer Maternal Grandmother 74       ADVANCED DIRECTIVES:    HEALTH MAINTENANCE: Social History  Substance Use Topics  . Smoking status: Never Smoker  . Smokeless tobacco: Never Used  . Alcohol use No     Colonoscopy:  PAP:  Bone density:  Lipid panel:  Allergies  Allergen Reactions  . Fentanyl Nausea And Vomiting and Nausea Only    vomiting Was given in PACU x3 each time patient got sick vomiting Was given in PACU x3 each time patient got sick  . Sulfa Antibiotics Hives and Other (See Comments)    Light headed, over heated    Current Outpatient Prescriptions  Medication Sig Dispense Refill  . amantadine (SYMMETREL) 100 MG capsule Take 1 capsule (100 mg total) by mouth 3 (three) times daily. 90 capsule 12  . baclofen (LIORESAL) 10 MG tablet Take 1-2 tablets (10-20 mg total) by mouth 3 (three) times daily. 150 tablet 12  . Cholecalciferol (VITAMIN D3) 2000 UNITS capsule Take 2,000 Units by mouth daily.    . Cranberry 1000 MG CAPS Take by mouth daily.    . fentaNYL (DURAGESIC - DOSED MCG/HR) 50 MCG/HR Place 1 patch (50 mcg total) onto the skin every 3 (three) days. 10 patch 0  . GLUCOSAMINE-CHONDROITIN PO Take 1 tablet by mouth 3 (three) times daily. STRENGTH: 1500-1200    . ibuprofen (ADVIL,MOTRIN) 600 MG tablet TAKE 1 TABLET (600 MG TOTAL) BY MOUTH EVERY 6 (SIX) HOURS AS NEEDED. 60 tablet 2  . interferon beta-1a (REBIF) 44 MCG/0.5ML SOSY injection Inject 44 mcg into the skin 3 (three) times a week.    . levETIRAcetam (KEPPRA) 500 MG tablet Take 1 tablet (500 mg total) by mouth 2 (two) times daily. 180 tablet 4  . lidocaine-prilocaine (EMLA) cream Apply 1 application topically  once. Apply to port then cover with saran wrap 1-2 hours before chemotherapy    . loratadine (CLARITIN) 10 MG tablet Take 10 mg by mouth daily.    . Misc Natural Products (LEG VEIN & CIRCULATION) TABS Take by mouth daily.    . Multiple Vitamins-Minerals (MULTIVITAMIN PO) Take 1 tablet by mouth daily.    Marland Kitchen oxybutynin (DITROPAN-XL) 10 MG 24 hr tablet TAKE 2 TABLETS BY MOUTH DAILY 180 tablet 3  . Oxycodone HCl 10 MG TABS Take 1 tablet (10 mg total) by mouth every 6 (six) hours as needed. 120 tablet 0  . prochlorperazine (COMPAZINE) 10 MG tablet Take 1 tablet (10 mg total) by mouth every 6 (six) hours as needed. 60 tablet 5   No current facility-administered medications for this visit.    Facility-Administered Medications Ordered in Other Visits  Medication Dose Route Frequency Provider Last Rate Last Dose  . sodium chloride flush (NS) 0.9 %  injection 10 mL  10 mL Intravenous PRN Lloyd Huger, MD   10 mL at 12/16/15 1249  . sodium chloride flush (NS) 0.9 % injection 10 mL  10 mL Intravenous PRN Lloyd Huger, MD   10 mL at 02/02/16 1421    OBJECTIVE: Vitals:   02/02/16 1439  BP: 122/79  Pulse: 68  Resp: 18  Temp: 98 F (36.7 C)     There is no height or weight on file to calculate BMI.    ECOG FS:1 - Symptomatic but completely ambulatory  General: Well-developed, well-nourished, no acute distress. Sitting in a wheelchair. Eyes: anicteric sclera. Breasts: Patient requested exam be deferred today. Lungs: Clear to auscultation bilaterally. Heart: Regular rate and rhythm. No rubs, murmurs, or gallops. Abdomen: Soft, nontender, nondistended. No organomegaly noted, normoactive bowel sounds. Musculoskeletal: Bilateral lower extremity lymphedema. Neuro: Alert, answering all questions appropriately. Cranial nerves grossly intact. Skin: No rashes or petechiae noted. Psych: Normal affect.   LAB RESULTS:  Lab Results  Component Value Date   NA 141 11/25/2015   K 4.3 11/25/2015     CL 103 11/25/2015   CO2 29 11/25/2015   GLUCOSE 95 11/25/2015   BUN 13 11/25/2015   CREATININE 0.49 (L) 11/25/2015   CALCIUM 9.6 11/25/2015   PROT 7.2 11/25/2015   ALBUMIN 4.3 11/25/2015   AST 25 11/25/2015   ALT 24 11/25/2015   ALKPHOS 73 11/25/2015   BILITOT 0.3 11/25/2015   GFRNONAA >89 11/25/2015   GFRAA >89 11/25/2015    Lab Results  Component Value Date   WBC 3.5 (L) 11/25/2015   NEUTROABS 2,310 11/25/2015   HGB 12.6 11/25/2015   HCT 37.5 11/25/2015   MCV 89.9 11/25/2015   PLT 138 (L) 11/25/2015   Lab Results  Component Value Date   LABCA2 18.5 08/05/2015     STUDIES: No results found.  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 BRCA negative.  PLAN:    1. Triple negative right breast cancer: No evidence of disease. CA-27-29 continues to be within normal limits. Because patient had a full mastectomy, she did not require adjuvant XRT. Given the fact that she is a triple negative cancer, she will not require an aromatase inhibitor. Given her difficulties with Taxol and multiple sclerosis, treatment was discontinued after a total of 6 of 12 cycles of Taxol. Her last chemotherapy was July 17, 2014. Mammogram of her left breast on March 11, 2015 was reported as BIRADS 1, repeat in one year.  Return to clinic in 6 months for laboratory work and further evaluation.  2. Multiple sclerosis: Continue evaluation and current treatment per neurology. 3. Lymphedema: Continue treatment and wraps as prescribed. 4. Peripheral neuropathy: Improved. Monitor. 5. Pain: Continue fentanyl patch to 50 g and 10 mg oxycodone as needed for breakthrough pain.  6. Nausea: Continue compazine 10 mg as needed for nausea 7. Headache: Trial drinking tea rather than coffee to see if headaches subside.  Her primary neurologist is Dr. Andrey Spearman.  phone [phone], fax [phone].   Patient expressed understanding and was in agreement with this plan. She  also understands that She can call clinic at any time with any questions, concerns, or complaints.   Breast cancer   Staging form: Breast, AJCC 7th Edition     Pathologic stage from 05/24/2014: Stage IA (T1c, N0, cM0) - Signed by Lloyd Huger, MD on 05/24/2014   Rebecca Herrlich, NP   02/02/2016 3:42 PM  Patient was seen and evaluated independently and I agree with the assessment and plan as dictated above. Patient continues to take "Re-bif" as treatment for her multiple sclerosis.  Lloyd Huger, MD 02/04/16 1:55 PM

## 2016-02-03 LAB — CANCER ANTIGEN 27.29: CA 27.29: 17.9 U/mL (ref 0.0–38.6)

## 2016-02-19 ENCOUNTER — Telehealth: Payer: Self-pay | Admitting: *Deleted

## 2016-02-19 MED ORDER — FENTANYL 50 MCG/HR TD PT72: 50.0000 ug | MEDICATED_PATCH | TRANSDERMAL | 0 refills | Status: DC

## 2016-02-19 NOTE — Telephone Encounter (Signed)
Are you still refilling pain medications for her or have you deferred this back to her Neurologist as previously discussed? Please advise

## 2016-02-19 NOTE — Telephone Encounter (Signed)
I still handle it for now.

## 2016-02-22 ENCOUNTER — Other Ambulatory Visit: Payer: Medicare Other

## 2016-02-25 ENCOUNTER — Encounter: Payer: Medicare Other | Admitting: Obstetrics and Gynecology

## 2016-03-02 ENCOUNTER — Encounter: Payer: Medicare Other | Admitting: Obstetrics and Gynecology

## 2016-03-02 ENCOUNTER — Other Ambulatory Visit: Payer: Self-pay | Admitting: *Deleted

## 2016-03-02 MED ORDER — OXYCODONE HCL 10 MG PO TABS: 10.0000 mg | ORAL_TABLET | Freq: Four times a day (QID) | ORAL | 0 refills | Status: DC | PRN

## 2016-03-02 NOTE — Addendum Note (Signed)
Addended by: Jarrett Soho C on: 03/02/2016 12:00 PM   Modules accepted: Orders

## 2016-03-02 NOTE — Telephone Encounter (Signed)
Asked patient to call her neurologist to see if he will take over her pain medications and if not to call us back per VO Dr Grayland Ormond, she was in agreement with this

## 2016-03-02 NOTE — Telephone Encounter (Signed)
Patient called back and said that her Neurologist prefers that Dr Grayland Ormond continue to write for her pain meds

## 2016-03-03 ENCOUNTER — Encounter: Payer: Medicare Other | Admitting: Obstetrics and Gynecology

## 2016-03-09 ENCOUNTER — Encounter: Payer: Medicare Other | Admitting: Obstetrics and Gynecology

## 2016-03-11 ENCOUNTER — Ambulatory Visit: Payer: Medicare Other

## 2016-03-14 ENCOUNTER — Ambulatory Visit (INDEPENDENT_AMBULATORY_CARE_PROVIDER_SITE_OTHER): Payer: Medicare Other

## 2016-03-14 ENCOUNTER — Other Ambulatory Visit: Payer: Self-pay

## 2016-03-14 DIAGNOSIS — I359 Nonrheumatic aortic valve disorder, unspecified: Secondary | ICD-10-CM

## 2016-03-14 LAB — ECHOCARDIOGRAM COMPLETE
AVPHT: 460 ms
CHL CUP MV DEC (S): 211
CHL CUP RV SYS PRESS: 35 mmHg
E decel time: 211 msec
E/e' ratio: 6.52
FS: 37 % (ref 28–44)
IVS/LV PW RATIO, ED: 0.75
LA ID, A-P, ES: 33 mm
LA diam end sys: 33 mm
LA vol index: 26.7 mL/m2
LA vol: 50.4 mL
LADIAMINDEX: 1.75 cm/m2
LAVOLA4C: 43.9 mL
LDCA: 2.54 cm2
LV E/e' medial: 6.52
LV E/e'average: 6.52
LV PW d: 8.87 mm — AB (ref 0.6–1.1)
LV TDI E'MEDIAL: 6.73
LV dias vol index: 43 mL/m2
LV sys vol index: 20 mL/m2
LVDIAVOL: 80 mL (ref 46–106)
LVELAT: 9.89 cm/s
LVOT VTI: 23.2 cm
LVOT diameter: 18 mm
LVOT peak vel: 105 cm/s
LVOTSV: 59 mL
LVSYSVOL: 38 mL (ref 14–42)
MV pk E vel: 64.5 m/s
MVPKAVEL: 95.6 m/s
RV LATERAL S' VELOCITY: 15.1 cm/s
Reg peak vel: 282 cm/s
Simpson's disk: 53
Stroke v: 43 ml
TAPSE: 24.3 mm
TDI e' lateral: 9.89
TRMAXVEL: 282 cm/s

## 2016-03-15 ENCOUNTER — Ambulatory Visit (INDEPENDENT_AMBULATORY_CARE_PROVIDER_SITE_OTHER): Payer: Medicare Other | Admitting: Obstetrics and Gynecology

## 2016-03-15 ENCOUNTER — Encounter: Payer: Self-pay | Admitting: Obstetrics and Gynecology

## 2016-03-15 VITALS — BP 125/78 | HR 72

## 2016-03-15 DIAGNOSIS — G35 Multiple sclerosis: Secondary | ICD-10-CM | POA: Diagnosis not present

## 2016-03-15 DIAGNOSIS — Z01419 Encounter for gynecological examination (general) (routine) without abnormal findings: Secondary | ICD-10-CM | POA: Diagnosis not present

## 2016-03-15 DIAGNOSIS — Z124 Encounter for screening for malignant neoplasm of cervix: Secondary | ICD-10-CM | POA: Diagnosis not present

## 2016-03-15 NOTE — Addendum Note (Signed)
Addended by: Raliegh Ip on: 03/15/2016 02:44 PM   Modules accepted: Orders

## 2016-03-15 NOTE — Progress Notes (Signed)
New 56 yo patient here for a pap smear. Patient has a mammogram scheduled on 04/12/16.Right breast mastectomy 2015. Patient is in a wheelchair and is unable to stand on scale.

## 2016-03-15 NOTE — Progress Notes (Signed)
HPI:      [name] is a 56 y.o. G3P0020 who LMP was No LMP recorded. Patient is postmenopausal.  Subjective: She presents today referred by Dr. Sanda [name] for a pelvic examination and Pap smear. The patient has a history of breast cancer and is followed closely by her general surgeon who does examinations and follow-up mammography. The patient has MS. She reports no history of abnormal Pap smear.    Hx: The following portions of the patient's history were reviewed and updated as appropriate:           She  has a past medical history of Aortic valve disease; Bacterial endocarditis; Bilateral lower extremity edema; Breast cancer ([place]) (12-31-13); Cervical stenosis of spine; Herpes zoster; IBS (irritable bowel syndrome); Lymphedema; Multiple sclerosis ([place]) (2001); Neuromuscular disorder ([place]); Seizures ([place]); and Syncope and collapse. She  does not have any pertinent problems on file. She  has a past surgical history that includes Cholecystectomy; Ankle surgery; Port-a-cath removal; Anterior cervical decomp/discectomy fusion (11/17/2011); transthoracic echocardiogram (03/2013; 02/2014); Lower extremity venous Dopplers (Feb 27, 2013); Port a cath insertion (Right, 01/19/2010); Colonoscopy (2014); Upper gi endoscopy (2014); Port-a-cath removal (Right, 09/03/2013); Breast biopsy (Right, 12-31-13); Breast surgery (Right, 02/03/2014); Mastectomy; and Ankle surgery. Her family history includes Breast cancer (age of onset: 46) in her maternal aunt; Breast cancer (age of onset: 1) in her maternal grandmother; Cancer in her father; Heart attack in her father; Heart disease in her father; Ovarian cancer in her cousin; Thyroid disease in her sister. She  reports that she has never smoked. She has never used smokeless tobacco. She reports that she does not drink alcohol or use drugs. She has a current medication list which includes the following prescription(s): amantadine, baclofen, vitamin d3, cranberry,  fentanyl, glucosamine-chondroitin, ibuprofen, interferon beta-1a, levetiracetam, lidocaine-prilocaine, loratadine, leg vein & circulation, multiple vitamins-minerals, oxybutynin, oxycodone hcl, and prochlorperazine, and the following Facility-Administered Medications: sodium chloride flush and sodium chloride flush.        ROS: Constitutional: Denied constitutional symptoms, night sweats, recent illness, fatigue, fever, insomnia and weight loss.  Eyes: Denied eye symptoms, eye pain, photophobia, vision change and visual disturbance.  Ears/Nose/Throat/Neck: Denied ear, nose, throat or neck symptoms, hearing loss, nasal discharge, sinus congestion and sore throat.  Cardiovascular: Denied cardiovascular symptoms, arrhythmia, chest pain/pressure, edema, exercise intolerance, orthopnea and palpitations.  Respiratory: Denied pulmonary symptoms, asthma, pleuritic pain, productive sputum, cough, dyspnea and wheezing.  Gastrointestinal: Denied, gastro-esophageal reflux, melena, nausea and vomiting.  Genitourinary: Denied genitourinary symptoms including symptomatic vaginal discharge, pelvic relaxation issues, and urinary complaints.  Musculoskeletal: Denied musculoskeletal symptoms, stiffness, swelling, muscle weakness and myalgia.  Dermatologic: Denied dermatology symptoms, rash and scar.  Neurologic: Denied neurology symptoms, dizziness, headache, neck pain and syncope.  Psychiatric: Denied psychiatric symptoms, anxiety and depression.  Endocrine: Denied endocrine symptoms including hot flashes and night sweats.   Meds: She has a current medication list which includes the following prescription(s): amantadine, baclofen, vitamin d3, cranberry, fentanyl, glucosamine-chondroitin, ibuprofen, interferon beta-1a, levetiracetam, lidocaine-prilocaine, loratadine, leg vein & circulation, multiple vitamins-minerals, oxybutynin, oxycodone hcl, and prochlorperazine, and the following Facility-Administered Medications:  sodium chloride flush and sodium chloride flush.  Objective: Vitals:   03/15/16 1400  BP: 125/78  Pulse: 72            Physical examination   Pelvic:   Vulva: Normal appearance.  No lesions.  Vagina: No lesions or abnormalities noted. Significant vaginal atrophy   Support: Normal pelvic support.  Urethra No masses tenderness or scarring.  Meatus Normal size without lesions or prolapse.  Cervix: Normal appearance.  No lesions.  Anus: Normal exam.  No lesions.  Perineum: Normal exam.  No lesions.        Bimanual   Uterus: Normal size.  Non-tender.  Mobile.  AV.  Adnexae: No masses.  Non-tender to palpation.  Cul-de-sac: Negative for abnormality.   Very difficult speculum and bimanual examination. Patient physically unable to cooperate and physically unable to spread her lower extremities.  Assessment: 1. Encounter for gynecological examination without abnormal finding   2. MS (multiple sclerosis) ([place])     Plan:            1.  Pap smear performed. We will contact her with any abnormal findings.  Orders No orders of the defined types were placed in this encounter.   No orders of the defined types were placed in this encounter.       F/U  Return in about 1 year (around 03/15/2017).  Finis Bud, M.D. 03/15/2016 2:37 PM

## 2016-03-18 ENCOUNTER — Telehealth: Payer: Self-pay

## 2016-03-18 LAB — PAP IG AND HPV HIGH-RISK
HPV, high-risk: NEGATIVE
PAP Smear Comment: 0

## 2016-03-18 NOTE — Telephone Encounter (Signed)
Pt informed of negative test results per provider 

## 2016-03-18 NOTE — Telephone Encounter (Signed)
-----   Message from Harlin Heys, MD sent at 03/18/2016  2:08 PM EST ----- Negative Pap and HPV

## 2016-03-23 ENCOUNTER — Inpatient Hospital Stay: Payer: Medicare Other

## 2016-03-24 ENCOUNTER — Telehealth: Payer: Self-pay | Admitting: *Deleted

## 2016-03-24 NOTE — Telephone Encounter (Signed)
-----   Message from Georgiana Shore, RN sent at 03/24/2016  1:46 PM EST ----- Pt saw Dr. Ellyn Hack May 2017 w/instructions for Jan 2018 echo. Per his notes, follow up appt time frame to be determine based on the echo results. I will forward back to Dr. Ellyn Hack to advise before the patient is called.

## 2016-03-24 NOTE — Telephone Encounter (Signed)
LEFT MESSAGE TO CAL BACK ---RESULT OF ECHO   RECALL FOR MAY IN [place] OFFICE WITH DR Rockey Situ

## 2016-03-25 ENCOUNTER — Other Ambulatory Visit: Payer: Self-pay | Admitting: *Deleted

## 2016-03-25 MED ORDER — FENTANYL 50 MCG/HR TD PT72: 50.0000 ug | MEDICATED_PATCH | TRANSDERMAL | 0 refills | Status: DC

## 2016-03-30 NOTE — Telephone Encounter (Signed)
Spoke to patient. Result given . Verbalized understanding. Per Dr Ellyn Hack, f/u with [place] office for annual appointment. Patient aware to follow up with Dr Rockey Situ in 5 /2018 for annual office visit

## 2016-03-31 ENCOUNTER — Inpatient Hospital Stay: Payer: Medicare Other

## 2016-04-05 ENCOUNTER — Inpatient Hospital Stay: Payer: Medicare Other

## 2016-04-06 ENCOUNTER — Other Ambulatory Visit: Payer: Self-pay | Admitting: *Deleted

## 2016-04-06 MED ORDER — OXYCODONE HCL 10 MG PO TABS: 10.0000 mg | ORAL_TABLET | Freq: Four times a day (QID) | ORAL | 0 refills | Status: DC | PRN

## 2016-04-08 ENCOUNTER — Inpatient Hospital Stay: Payer: Medicare Other | Attending: Oncology

## 2016-04-08 DIAGNOSIS — I890 Lymphedema, not elsewhere classified: Secondary | ICD-10-CM | POA: Diagnosis not present

## 2016-04-08 DIAGNOSIS — G35 Multiple sclerosis: Secondary | ICD-10-CM | POA: Diagnosis not present

## 2016-04-08 DIAGNOSIS — G629 Polyneuropathy, unspecified: Secondary | ICD-10-CM | POA: Insufficient documentation

## 2016-04-08 DIAGNOSIS — Z9013 Acquired absence of bilateral breasts and nipples: Secondary | ICD-10-CM | POA: Insufficient documentation

## 2016-04-08 DIAGNOSIS — Z452 Encounter for adjustment and management of vascular access device: Secondary | ICD-10-CM | POA: Insufficient documentation

## 2016-04-08 DIAGNOSIS — Z853 Personal history of malignant neoplasm of breast: Secondary | ICD-10-CM | POA: Insufficient documentation

## 2016-04-08 DIAGNOSIS — C50911 Malignant neoplasm of unspecified site of right female breast: Secondary | ICD-10-CM

## 2016-04-08 DIAGNOSIS — Z79899 Other long term (current) drug therapy: Secondary | ICD-10-CM | POA: Diagnosis not present

## 2016-04-08 DIAGNOSIS — Z171 Estrogen receptor negative status [ER-]: Secondary | ICD-10-CM | POA: Diagnosis not present

## 2016-04-08 MED ORDER — SODIUM CHLORIDE 0.9% FLUSH
10.0000 mL | INTRAVENOUS | Status: DC | PRN
  Administered 2016-04-08: 10 mL via INTRAVENOUS
  Filled 2016-04-08: qty 10

## 2016-04-08 MED ORDER — HEPARIN SOD (PORK) LOCK FLUSH 100 UNIT/ML IV SOLN
500.0000 [IU] | Freq: Once | INTRAVENOUS | Status: AC
Start: 2016-04-08 — End: 2016-04-08
  Administered 2016-04-08: 500 [IU] via INTRAVENOUS

## 2016-04-08 MED ORDER — HEPARIN SOD (PORK) LOCK FLUSH 100 UNIT/ML IV SOLN
INTRAVENOUS | Status: AC
  Filled 2016-04-08: qty 5

## 2016-04-17 ENCOUNTER — Other Ambulatory Visit: Payer: Self-pay | Admitting: Family Medicine

## 2016-05-02 ENCOUNTER — Other Ambulatory Visit: Payer: Self-pay | Admitting: *Deleted

## 2016-05-02 MED ORDER — FENTANYL 50 MCG/HR TD PT72: 50.0000 ug | MEDICATED_PATCH | TRANSDERMAL | 0 refills | Status: DC

## 2016-05-04 ENCOUNTER — Ambulatory Visit: Payer: Medicare Other

## 2016-05-10 ENCOUNTER — Telehealth: Payer: Self-pay | Admitting: Diagnostic Neuroimaging

## 2016-05-10 NOTE — Telephone Encounter (Signed)
I spoke to pt and offered appt for 05-12-16 with MM/NP at 1130.  Pt stated could not come on Thursday even at earlier time with other NP.  Her husband had dental appt and she asked about 05-17-16 and I did place in 1330 slot. I did attempt to get her in earlier and she is aware of this and is ok for 05-17-16 appt.

## 2016-05-10 NOTE — Telephone Encounter (Signed)
We should try to get her in this week.   See if she can get on Megan or Carolyn's schedule    May also be able to get in Thursday during Dr. Gladstone Lighter reading time.   If noone else can see her, I can try to get her on my schedule this week or Monday

## 2016-05-10 NOTE — Telephone Encounter (Addendum)
I called pt and she started having issues with LE weakness on Thursday last (4-5 days ago) which has progressively gotten worse.  She has had 2 episodes of incontinence.  She is on rebif.  No changes in her medications.  EMS checked her in her home, but she did not go to ED.  Her 6 mo appt is 05-30-16.  Dr. Leta Baptist out of office will send message to [facility]. [facility] [address].   She states was given earlier appt 05-17-16 by shirley (this is not recorded).  Spoke to Gordon and she saw 10 min for tomorrow, pt misunderstood.  There is no appt for her other then the 05-30-16.   I placed hold on 1130 05-12-16 with MM/NP if ok.

## 2016-05-10 NOTE — Telephone Encounter (Signed)
Patient having problems walking x4 days. When she gets up she falls down in chair. She would like to be seen soon. EMS came today and said there was nothing they could do for her. She is scheduled with Dr. Leta Baptist on 05-30-16.

## 2016-05-12 ENCOUNTER — Telehealth: Payer: Self-pay | Admitting: Diagnostic Neuroimaging

## 2016-05-12 ENCOUNTER — Other Ambulatory Visit: Payer: Self-pay | Admitting: *Deleted

## 2016-05-12 MED ORDER — INTERFERON BETA-1A 44 MCG/0.5ML ~~LOC~~ SOSY: 44.0000 ug | PREFILLED_SYRINGE | SUBCUTANEOUS | 12 refills | Status: DC

## 2016-05-12 MED ORDER — INTERFERON BETA-1A 44 MCG/0.5ML ~~LOC~~ SOAJ: 0.5000 mL | SUBCUTANEOUS | 4 refills | Status: DC

## 2016-05-12 NOTE — Addendum Note (Signed)
Addended byOliver Hum on: 05/12/2016 02:28 PM   Modules accepted: Orders

## 2016-05-12 NOTE — Telephone Encounter (Signed)
Elaura with [facility] called office needing clarification on patients interferon beta-1a (REBIF) 44 MCG/0.5ML SOSY injection.  Please call

## 2016-05-12 NOTE — Telephone Encounter (Signed)
Pt has appt 05-17-16.  Refilled for one yr.

## 2016-05-12 NOTE — Telephone Encounter (Signed)
I had escribed this as prefilled syringe and needed rebidose (what pt received before).  I clarified with Elaura at [facility] verbally.   Placed a new (no print ) in system for future refills.

## 2016-05-14 ENCOUNTER — Emergency Department: Payer: Medicare Other

## 2016-05-14 ENCOUNTER — Encounter: Payer: Self-pay | Admitting: Emergency Medicine

## 2016-05-14 ENCOUNTER — Inpatient Hospital Stay
Admission: EM | Admit: 2016-05-14 | Discharge: 2016-05-17 | DRG: 059 | Disposition: A | Discharge status: Other Acute Inpatient Hospital | Payer: Medicare Other | Attending: Internal Medicine | Admitting: Internal Medicine

## 2016-05-14 DIAGNOSIS — N319 Neuromuscular dysfunction of bladder, unspecified: Secondary | ICD-10-CM

## 2016-05-14 DIAGNOSIS — G35 Multiple sclerosis: Secondary | ICD-10-CM | POA: Diagnosis not present

## 2016-05-14 DIAGNOSIS — Z981 Arthrodesis status: Secondary | ICD-10-CM

## 2016-05-14 DIAGNOSIS — I890 Lymphedema, not elsewhere classified: Secondary | ICD-10-CM | POA: Diagnosis not present

## 2016-05-14 DIAGNOSIS — Z882 Allergy status to sulfonamides status: Secondary | ICD-10-CM

## 2016-05-14 DIAGNOSIS — Z79899 Other long term (current) drug therapy: Secondary | ICD-10-CM

## 2016-05-14 DIAGNOSIS — R569 Unspecified convulsions: Secondary | ICD-10-CM | POA: Diagnosis not present

## 2016-05-14 DIAGNOSIS — E875 Hyperkalemia: Secondary | ICD-10-CM | POA: Diagnosis not present

## 2016-05-14 DIAGNOSIS — N390 Urinary tract infection, site not specified: Secondary | ICD-10-CM | POA: Diagnosis not present

## 2016-05-14 DIAGNOSIS — Z853 Personal history of malignant neoplasm of breast: Secondary | ICD-10-CM

## 2016-05-14 DIAGNOSIS — G801 Spastic diplegic cerebral palsy: Secondary | ICD-10-CM | POA: Diagnosis not present

## 2016-05-14 DIAGNOSIS — G40209 Localization-related (focal) (partial) symptomatic epilepsy and epileptic syndromes with complex partial seizures, not intractable, without status epilepticus: Secondary | ICD-10-CM | POA: Diagnosis present

## 2016-05-14 DIAGNOSIS — R531 Weakness: Secondary | ICD-10-CM | POA: Diagnosis not present

## 2016-05-14 DIAGNOSIS — Z885 Allergy status to narcotic agent status: Secondary | ICD-10-CM

## 2016-05-14 DIAGNOSIS — N3281 Overactive bladder: Secondary | ICD-10-CM | POA: Diagnosis present

## 2016-05-14 DIAGNOSIS — G8929 Other chronic pain: Secondary | ICD-10-CM | POA: Diagnosis present

## 2016-05-14 DIAGNOSIS — Z9221 Personal history of antineoplastic chemotherapy: Secondary | ICD-10-CM

## 2016-05-14 DIAGNOSIS — C50919 Malignant neoplasm of unspecified site of unspecified female breast: Secondary | ICD-10-CM | POA: Diagnosis not present

## 2016-05-14 DIAGNOSIS — W19XXXA Unspecified fall, initial encounter: Secondary | ICD-10-CM | POA: Diagnosis not present

## 2016-05-14 LAB — CBC
HCT: 37.9 % (ref 35.0–47.0)
Hemoglobin: 12.9 g/dL (ref 12.0–16.0)
MCH: 30.8 pg (ref 26.0–34.0)
MCHC: 34.1 g/dL (ref 32.0–36.0)
MCV: 90.3 fL (ref 80.0–100.0)
PLATELETS: 127 10*3/uL — AB (ref 150–440)
RBC: 4.2 MIL/uL (ref 3.80–5.20)
RDW: 12.3 % (ref 11.5–14.5)
WBC: 3.6 10*3/uL (ref 3.6–11.0)

## 2016-05-14 LAB — URINALYSIS, COMPLETE (UACMP) WITH MICROSCOPIC
Bilirubin Urine: NEGATIVE
GLUCOSE, UA: NEGATIVE mg/dL
Hgb urine dipstick: NEGATIVE
Ketones, ur: 20 mg/dL — AB
Nitrite: NEGATIVE
PH: 5 (ref 5.0–8.0)
Protein, ur: 30 mg/dL — AB
Specific Gravity, Urine: 1.027 (ref 1.005–1.030)

## 2016-05-14 LAB — BASIC METABOLIC PANEL
Anion gap: 8 (ref 5–15)
BUN: 13 mg/dL (ref 6–20)
CO2: 28 mmol/L (ref 22–32)
Calcium: 9.6 mg/dL (ref 8.9–10.3)
Chloride: 106 mmol/L (ref 101–111)
Creatinine, Ser: 0.52 mg/dL (ref 0.44–1.00)
GFR calc Af Amer: >60 mL/min (ref >60)
Glucose, Bld: 109 mg/dL — ABNORMAL HIGH (ref 65–99)
Potassium: 4.1 mmol/L (ref 3.5–5.1)
SODIUM: 142 mmol/L (ref 135–145)

## 2016-05-14 LAB — TROPONIN I

## 2016-05-14 MED ORDER — GADOBENATE DIMEGLUMINE 529 MG/ML IV SOLN
16.0000 mL | Freq: Once | INTRAVENOUS | Status: AC | PRN
  Administered 2016-05-14: 16 mL via INTRAVENOUS

## 2016-05-14 NOTE — ED Notes (Signed)
Patient transported to CT 

## 2016-05-14 NOTE — ED Notes (Signed)
Pt placed on bedpan

## 2016-05-14 NOTE — ED Notes (Signed)
Pt taken off of bedpan and linens changed.

## 2016-05-14 NOTE — ED Notes (Signed)
Patient transported to MRI 

## 2016-05-14 NOTE — ED Provider Notes (Signed)
[facility] Emergency Department Provider Note  ____________________________________________   First MD Initiated Contact with Patient 05/14/16 1526     (approximate)  I have reviewed the triage vital signs and the nursing notes.   HISTORY  Chief Complaint Weakness    HPI GWYNN CHALKER is a 56 y.o. female history of multiple medical problems including MS, seizures, aortic disease  Patient presents today states that about 4-5 days ago she began experiencing increased feeling of weakness in the muscles of her right leg, giving her some difficulty with using her walker, then started becoming worse in the left leg the last day and a half causing her to not be able to use her walker  No pain. No nausea or vomiting. No chest pain or trouble breathing. Denies any headache. No trouble speaking. No weakness in the face. No new numbness.  Reports she's been left with weakness in her lower legs because of MS, symptoms seemed worse the last few days. Not able to walk, and family who usually assists her as not been able to sister because of her increased requirement for assistance.  Past Medical History:  Diagnosis Date  . Aortic valve disease    Mild AS / AI - most recent Echo demonstrated tricuspid aortic valve.  . Bacterial endocarditis    History of .  Marland Kitchen Bilateral lower extremity edema    Noncardiac.  Chronic. LE Venous dopplers - negative for DVT.; Echocardiogram January 2016: Normal EF with normal wall motion and valve function. Only grade 1 diastolic dysfunction. EF 60-65%. Mild MR  . Breast cancer ([place]) 12-31-13   Right breast, 12:00, 1.5 cm, T1c,N0 invasive mammary carcinoma, triple negative. --> Rx with Chemo  . Cervical stenosis of spine   . Herpes zoster   . IBS (irritable bowel syndrome)   . Lymphedema    has legs wrapped at [facility]  . Multiple sclerosis ([place]) 2001   Walks from room to room @ home; but Wheelchair when going out.  Marland Kitchen Neuromuscular  disorder ([place])    MS  . Seizures ([place])    Takes Keppra  . Syncope and collapse     Patient Active Problem List   Diagnosis Date Noted  . Encounter for screening for cervical cancer  11/27/2015  . Preventative health care 11/25/2015  . History of breast cancer in female 10/28/2015  . Anemia 10/28/2015  . Thrombocytopenia ([place]) 10/28/2015  . Allergic rhinitis 10/28/2015  . IBS (irritable bowel syndrome) 10/28/2015  . Encounter for eye exam 08/25/2015  . Aortic valve disease   . Heart murmur 10/24/2014  . 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 Lymphedema of both lower extremities 10/24/2014  . History of right mastectomy 10/24/2014  . Need for immunization against influenza 10/24/2014  . Medicare annual wellness visit, subsequent 10/24/2014  . MS (multiple sclerosis) ([place]) 09/16/2014  . Hematoma complicating a procedure 03/06/2014  . Primary cancer of right female breast ([place]) 01/10/2014  . SOB (shortness of breath) 03/01/2013  . Bilateral lower extremity edema   . 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 Herpes zoster 06/08/2011    Past Surgical History:  Procedure Laterality Date  . ANKLE SURGERY     Left  . ANKLE SURGERY    . ANTERIOR CERVICAL DECOMP/DISCECTOMY FUSION  11/17/2011   Procedure: ANTERIOR CERVICAL DECOMPRESSION/DISCECTOMY FUSION 2 LEVELS;  Surgeon: Erline Levine, MD;  Location: [facility];  Service: Neurosurgery;  Laterality: N/A;  Cervical Five-Six Six-Seven Anterior cervical decompression/diskectomy/fusion  . BREAST  BIOPSY Right 12-31-13   invasive mammary  . BREAST SURGERY Right 02/03/2014   Right simple mastectomy with sentinel node biopsy.  . CHOLECYSTECTOMY    . COLONOSCOPY  2014  . Lower extremity venous Dopplers  Feb 27, 2013   No LE DVT  . MASTECTOMY    . Port a cath insertion Right 01/19/2010  . PORT-A-CATH REMOVAL     right  . PORT-A-CATH REMOVAL Right 09/03/2013   Procedure: REMOVAL PORT-A-CATH;  Surgeon: Conrad Valentine, MD;  Location:  [facility];  Service: Vascular;  Laterality: Right;  . TRANSTHORACIC ECHOCARDIOGRAM  03/2013; 02/2014   a) Normal LV size and function with EF 60-65%.; Cannot exclude bicuspid aortic valve with mild AS and mild AI.; b) Normal EF with normal wall motion and valve function x Mild MR. G2 DD. EF 60-65%. Tricuspid AoV  . UPPER GI ENDOSCOPY  2014    Prior to Admission medications   Medication Sig Start Date End Date Taking? Authorizing Provider  amantadine (SYMMETREL) 100 MG capsule Take 1 capsule (100 mg total) by mouth 3 (three) times daily. 11/30/15   Penni Bombard, MD  baclofen (LIORESAL) 10 MG tablet Take 1-2 tablets (10-20 mg total) by mouth 3 (three) times daily. 11/30/15   Penni Bombard, MD  Cholecalciferol (VITAMIN D3) 2000 UNITS capsule Take 2,000 Units by mouth daily.    Historical Provider, MD  Cranberry 1000 MG CAPS Take by mouth daily.    Historical Provider, MD  fentaNYL (DURAGESIC - DOSED MCG/HR) 50 MCG/HR Place 1 patch (50 mcg total) onto the skin every 3 (three) days. 05/02/16   Lloyd Huger, MD  GLUCOSAMINE-CHONDROITIN PO Take 1 tablet by mouth 3 (three) times daily. STRENGTH: 1500-1200    Historical Provider, MD  ibuprofen (ADVIL,MOTRIN) 600 MG tablet TAKE 1 TABLET (600 MG TOTAL) BY MOUTH EVERY 6 (SIX) HOURS AS NEEDED. 04/17/16   [name], MD  Interferon Beta-1a (REBIF REBIDOSE) 44 MCG/0.5ML SOAJ Inject 0.5 mLs into the skin 3 (three) times a week. 05/13/16   Penni Bombard, MD  levETIRAcetam (KEPPRA) 500 MG tablet Take 1 tablet (500 mg total) by mouth 2 (two) times daily. 11/30/15   Penni Bombard, MD  lidocaine-prilocaine (EMLA) cream Apply 1 application topically once. Apply to port then cover with saran wrap 1-2 hours before chemotherapy    Historical Provider, MD  loratadine (CLARITIN) 10 MG tablet Take 10 mg by mouth daily.    Historical Provider, MD  Misc Natural Products (LEG VEIN & CIRCULATION) TABS Take by mouth daily.    Historical Provider, MD    Multiple Vitamins-Minerals (MULTIVITAMIN PO) Take 1 tablet by mouth daily.    Historical Provider, MD  oxybutynin (DITROPAN-XL) 10 MG 24 hr tablet TAKE 2 TABLETS BY MOUTH DAILY 06/08/15   Penni Bombard, MD  Oxycodone HCl 10 MG TABS Take 1 tablet (10 mg total) by mouth every 6 (six) hours as needed. 04/06/16   Lloyd Huger, MD  prochlorperazine (COMPAZINE) 10 MG tablet Take 1 tablet (10 mg total) by mouth every 6 (six) hours as needed. 02/02/16   Lucendia Herrlich, NP    Allergies Fentanyl and Sulfa antibiotics  Family History  Problem Relation Age of Onset  . Cancer Father     skin  . Heart disease Father   . Heart attack Father     heart attack in his 94's  . Thyroid disease Sister   . Ovarian cancer Cousin   . Breast cancer  Maternal Aunt 60  . Breast cancer Maternal Grandmother 74    Social History Social History  Substance Use Topics  . Smoking status: Never Smoker  . Smokeless tobacco: Never Used  . Alcohol use No    Review of Systems Constitutional: No fever/chills Eyes: No visual changes. ENT: No sore throat. Cardiovascular: Denies chest pain. Respiratory: Denies shortness of breath. Gastrointestinal: No abdominal pain.  No nausea, no vomiting.  No diarrhea.  No constipation. Genitourinary: Negative for dysuria. Musculoskeletal: Negative for back pain. Skin: Negative for rash. Neurological: Negative for headaches.  10-point ROS otherwise negative.  ____________________________________________   PHYSICAL EXAM:  VITAL SIGNS: ED Triage Vitals  Enc Vitals Group     BP 05/14/16 1347 134/72     Pulse Rate 05/14/16 1347 69     Resp 05/14/16 1347 18     Temp 05/14/16 1347 98.1 F (36.7 C)     Temp Source 05/14/16 1347 Oral     SpO2 05/14/16 1347 99 %     Weight 05/14/16 1349 175 lb (79.4 kg)     Height 05/14/16 1349 5\' 4"  (1.626 m)     Head Circumference --      Peak Flow --      Pain Score --      Pain Loc --      Pain Edu? --      Excl. in  [place]? --     Constitutional: Alert and oriented. Well appearing and in no acute distress. Eyes: Conjunctivae are normal. PERRL. EOMI. Head: Atraumatic. Nose: No congestion/rhinnorhea. Mouth/Throat: Mucous membranes are moist.  Oropharynx non-erythematous. Neck: No stridor.   Cardiovascular: Normal rate, regular rhythm. Grossly normal heart sounds.  Good peripheral circulation. Respiratory: Normal respiratory effort.  No retractions. Lungs CTAB. Gastrointestinal: Soft and nontender. No distention. No abdominal bruits. No CVA tenderness. Musculoskeletal: Normal strength in the upper extremities bilaterally. Lower extremity is demonstrate no loss of sensation, but she reports weakness somewhat worse in the right lower extremity the left. Hip flexor extensors about 3+ in the right lower extremity about 4+ in the left. Neurologic:  Normal speech and language. No gross focal neurologic deficits are appreciated.  Skin:  Skin is warm, dry and intact. No rash noted. Psychiatric: Mood and affect are normal. Speech and behavior are normal.  ____________________________________________   LABS (all labs ordered are listed, but only abnormal results are displayed)  Labs Reviewed  BASIC METABOLIC PANEL - Abnormal; Notable for the following:       Result Value   Glucose, Bld 109 (*)    All other components within normal limits  CBC - Abnormal; Notable for the following:    Platelets 127 (*)    All other components within normal limits  URINALYSIS, COMPLETE (UACMP) WITH MICROSCOPIC - Abnormal; Notable for the following:    Color, Urine AMBER (*)    APPearance CLOUDY (*)    Ketones, ur 20 (*)    Protein, ur 30 (*)    Leukocytes, UA TRACE (*)    Bacteria, UA RARE (*)    Squamous Epithelial / LPF 0-5 (*)    All other components within normal limits  TROPONIN I   ____________________________________________  EKG  ED ECG REPORT I, Vicki Pasqual, the attending physician, personally viewed and  interpreted this ECG.  Date: 05/14/2016 EKG Time: 1605 Rate: 70 Rhythm: normal sinus rhythm QRS Axis: normal Intervals: normal ST/T Wave abnormalities: normal Conduction Disturbances: none Narrative Interpretation: unremarkable  ____________________________________________  RADIOLOGY  Ct Head  Wo Contrast  Result Date: 05/14/2016 CLINICAL DATA:  Patient with history of MS states she has increased weakness x 3 to 4 days. EXAM: CT HEAD WITHOUT CONTRAST TECHNIQUE: Contiguous axial images were obtained from the base of the skull through the vertex without intravenous contrast. COMPARISON:  CT head dated 02/28/2011 and brain MRI dated 10/07/2015. FINDINGS: Brain: Mild generalized parenchymal atrophy with commensurate dilatation of the sulci. Ventricles are stable in size and configuration. Patchy areas of low density within the bilateral periventricular and subcortical white matter regions bilaterally, compatible with the white matter changes demonstrated on earlier MRI related to patient's multiple sclerosis. There is no mass, hemorrhage, edema or other evidence of acute parenchymal abnormality. No extra-axial hemorrhage. Vascular: There are chronic calcified atherosclerotic changes of the large vessels at the skull base. No unexpected hyperdense vessel. Skull: Normal. Negative for fracture or focal lesion. Sinuses/Orbits: No acute finding. Other: None. IMPRESSION: 1. No acute findings.  No intracranial mass, hemorrhage or edema. 2. Stable white matter changes, consistent with sequela of patient's multiple sclerosis. Electronically Signed   By: Franki Cabot M.D.   On: 05/14/2016 16:44    ____________________________________________   PROCEDURES  Procedure(s) performed: None  Procedures  Critical Care performed: No  ____________________________________________   INITIAL IMPRESSION / ASSESSMENT AND PLAN / ED COURSE  Pertinent labs & imaging results that were available during my care of  the patient were reviewed by me and considered in my medical decision making (see chart for details).  Patient presents for increasing weakness and lower extremities bilateral. Acuity worsening over the last 4-5 days. Pertinent history of MS, denies any systemic symptoms or other obvious focal etiology.  No cardiac or pulmonary symptoms. No evidence of weakness in the face or upper extremities. No loss of sensation, doesn't weakness in the right leg slightly greater than left. No strokelike symptoms. Lab evaluation, urinalysis, EKG all reassuring here.  Somewhat unclear, but I suspect this may be due to an MS flare. Discussed with neuro hospitalist, Dr.Oster recommends the patient be admitted and have MRI with and without contrast of the brain, cervical and thoracic spine. He recommends high-dose Solu-Medrol/steroid treatment if positive for new or concerning MS lesions. ----------------------------------------- 5:46 PM on 05/14/2016 -----------------------------------------   Patient agreeable with plan for admission. Currently resting comfortably. Discussed with and ongoing care and to the hospitalist service      ____________________________________________   FINAL CLINICAL IMPRESSION(S) / ED DIAGNOSES  Final diagnoses:  Weakness  Multiple sclerosis    NEW MEDICATIONS STARTED DURING THIS VISIT:  New Prescriptions   No medications on file     Note:  This document was prepared using Dragon voice recognition software and may include unintentional dictation errors.     Delman Kitten, MD 05/14/16 [phone]

## 2016-05-14 NOTE — ED Notes (Signed)
Checked on patient's family.  Denies needs at this time.  Patient still in MRI.

## 2016-05-14 NOTE — H&P (Signed)
[facility] [place] NAME: Rebecca [name]    MR#:  [id number]  DATE OF BIRTH:  09-Jan-1961  DATE OF ADMISSION:  05/14/2016  PRIMARY CARE PHYSICIAN: Enid Derry, MD   REQUESTING/REFERRING PHYSICIAN: Jacqualine Code, MD  CHIEF COMPLAINT:   Chief Complaint  Patient presents with  . Weakness    HISTORY OF PRESENT ILLNESS:  Rebecca [name]  is a 56 y.o. female who presents with Increasing bilateral lower extremity weakness, as well as some bilateral upper extremity weakness. Patient has a history of multiple sclerosis, though she states she has never had an acute flare like this before. She denies any sensory deficits. On-call neurologist recommended MRI in the ED, which did not show any new MS lesions. Hospitalists were called for admission and further workup  PAST MEDICAL HISTORY:   Past Medical History:  Diagnosis Date  . Aortic valve disease    Mild AS / AI - most recent Echo demonstrated tricuspid aortic valve.  . Bacterial endocarditis    History of .  Marland Kitchen Bilateral lower extremity edema    Noncardiac.  Chronic. LE Venous dopplers - negative for DVT.; Echocardiogram January 2016: Normal EF with normal wall motion and valve function. Only grade 1 diastolic dysfunction. EF 60-65%. Mild MR  . Breast cancer ([place]) 12-31-13   Right breast, 12:00, 1.5 cm, T1c,N0 invasive mammary carcinoma, triple negative. --> Rx with Chemo  . Cervical stenosis of spine   . Herpes zoster   . IBS (irritable bowel syndrome)   . Lymphedema    has legs wrapped at [facility]  . Multiple sclerosis ([place]) 2001   Walks from room to room @ home; but Wheelchair when going out.  Marland Kitchen Neuromuscular disorder ([place])    MS  . Seizures ([place])    Takes Keppra  . Syncope and collapse     PAST SURGICAL HISTORY:   Past Surgical History:  Procedure Laterality Date  . ANKLE SURGERY     Left  . ANKLE SURGERY    . ANTERIOR CERVICAL DECOMP/DISCECTOMY FUSION  11/17/2011   Procedure:  ANTERIOR CERVICAL DECOMPRESSION/DISCECTOMY FUSION 2 LEVELS;  Surgeon: Erline Levine, MD;  Location: [facility];  Service: Neurosurgery;  Laterality: N/A;  Cervical Five-Six Six-Seven Anterior cervical decompression/diskectomy/fusion  . BREAST BIOPSY Right 12-31-13   invasive mammary  . BREAST SURGERY Right 02/03/2014   Right simple mastectomy with sentinel node biopsy.  . CHOLECYSTECTOMY    . COLONOSCOPY  2014  . Lower extremity venous Dopplers  Feb 27, 2013   No LE DVT  . MASTECTOMY    . Port a cath insertion Right 01/19/2010  . PORT-A-CATH REMOVAL     right  . PORT-A-CATH REMOVAL Right 09/03/2013   Procedure: REMOVAL PORT-A-CATH;  Surgeon: Conrad Westboro, MD;  Location: [facility];  Service: Vascular;  Laterality: Right;  . TRANSTHORACIC ECHOCARDIOGRAM  03/2013; 02/2014   a) Normal LV size and function with EF 60-65%.; Cannot exclude bicuspid aortic valve with mild AS and mild AI.; b) Normal EF with normal wall motion and valve function x Mild MR. G2 DD. EF 60-65%. Tricuspid AoV  . UPPER GI ENDOSCOPY  2014    SOCIAL HISTORY:   Social History  Substance Use Topics  . Smoking status: Never Smoker  . Smokeless tobacco: Never Used  . Alcohol use No    FAMILY HISTORY:   Family History  Problem Relation Age of Onset  . Cancer Father     skin  .  Heart disease Father   . Heart attack Father     heart attack in his 80's  . Thyroid disease Sister   . Ovarian cancer Cousin   . Breast cancer Maternal Aunt 60  . Breast cancer Maternal Grandmother 74    DRUG ALLERGIES:   Allergies  Allergen Reactions  . Fentanyl Nausea And Vomiting and Nausea Only    vomiting Was given in PACU x3 each time patient got sick vomiting Was given in PACU x3 each time patient got sick  . Sulfa Antibiotics Hives and Other (See Comments)    Light headed, over heated    MEDICATIONS AT HOME:   Prior to Admission medications   Medication Sig Start Date End Date Taking? Authorizing Provider  amantadine  (SYMMETREL) 100 MG capsule Take 1 capsule (100 mg total) by mouth 3 (three) times daily. 11/30/15  Yes Penni Bombard, MD  baclofen (LIORESAL) 10 MG tablet Take 1-2 tablets (10-20 mg total) by mouth 3 (three) times daily. 11/30/15  Yes Penni Bombard, MD  Cholecalciferol (VITAMIN D3) 2000 UNITS capsule Take 2,000 Units by mouth daily.   Yes Historical Provider, MD  Cranberry 400 MG TABS Take 1 capsule by mouth daily. Also has magnesium, calcium and vit c   Yes Historical Provider, MD  fentaNYL (DURAGESIC - DOSED MCG/HR) 50 MCG/HR Place 1 patch (50 mcg total) onto the skin every 3 (three) days. 05/02/16  Yes Lloyd Huger, MD  GLUCOSAMINE-CHONDROITIN PO Take 1 tablet by mouth 4 (four) times daily. STRENGTH: 1500-1200    Yes Historical Provider, MD  ibuprofen (ADVIL,MOTRIN) 600 MG tablet TAKE 1 TABLET (600 MG TOTAL) BY MOUTH EVERY 6 (SIX) HOURS AS NEEDED. 04/17/16  Yes [name], MD  Interferon Beta-1a (REBIF REBIDOSE) 44 MCG/0.5ML SOAJ Inject 0.5 mLs into the skin 3 (three) times a week. Patient taking differently: Inject 0.5 mLs into the skin 3 (three) times a week. Monday, Wednesday and Friday 05/13/16  Yes Penni Bombard, MD  levETIRAcetam (KEPPRA) 500 MG tablet Take 1 tablet (500 mg total) by mouth 2 (two) times daily. 11/30/15  Yes Penni Bombard, MD  lidocaine-prilocaine (EMLA) cream Apply 1 application topically once. Apply to port then cover with saran wrap 1-2 hours before chemotherapy   Yes Historical Provider, MD  loratadine (CLARITIN) 10 MG tablet Take 10 mg by mouth daily.   Yes Historical Provider, MD  Lutein-Zeaxanthin 25-5 MG CAPS Take 1 capsule by mouth 2 (two) times daily.   Yes Historical Provider, MD  Misc Natural Products (LEG VEIN & CIRCULATION) TABS Take 1 tablet by mouth 2 (two) times daily.    Yes Historical Provider, MD  Multiple Vitamins-Minerals (MULTIVITAMIN PO) Take 1 tablet by mouth daily.   Yes Historical Provider, MD  oxybutynin (DITROPAN-XL) 10 MG  24 hr tablet TAKE 2 TABLETS BY MOUTH DAILY 06/08/15  Yes Penni Bombard, MD  Oxycodone HCl 10 MG TABS Take 1 tablet (10 mg total) by mouth every 6 (six) hours as needed. 04/06/16  Yes Lloyd Huger, MD  prochlorperazine (COMPAZINE) 10 MG tablet Take 1 tablet (10 mg total) by mouth every 6 (six) hours as needed. 02/02/16  Yes Lucendia Herrlich, NP  Turmeric 500 MG CAPS Take 1 tablet by mouth daily.   Yes Historical Provider, MD    REVIEW OF SYSTEMS:  Review of Systems  Constitutional: Negative for chills, fever, malaise/fatigue and weight loss.  HENT: Negative for ear pain, hearing loss and tinnitus.   Eyes:  Negative for blurred vision, double vision, pain and redness.  Respiratory: Negative for cough, hemoptysis and shortness of breath.   Cardiovascular: Negative for chest pain, palpitations, orthopnea and leg swelling.  Gastrointestinal: Negative for abdominal pain, constipation, diarrhea, nausea and vomiting.  Genitourinary: Negative for dysuria, frequency and hematuria.  Musculoskeletal: Negative for back pain, joint pain and neck pain.  Skin:       No acne, rash, or lesions  Neurological: Positive for weakness. Negative for dizziness, tremors and focal weakness.  Endo/Heme/Allergies: Negative for polydipsia. Does not bruise/bleed easily.  Psychiatric/Behavioral: Negative for depression. The patient is not nervous/anxious and does not have insomnia.      VITAL SIGNS:   Vitals:   05/14/16 1700 05/14/16 1730 05/14/16 1800 05/14/16 2300  BP:      Pulse: 71 71 75 100  Resp: 13 13 18    Temp:      TempSrc:      SpO2: 100% 100% 99% 99%  Weight:      Height:       Wt Readings from Last 3 Encounters:  05/14/16 79.4 kg (175 lb)  11/25/15 77.1 kg (170 lb)  08/06/15 80.3 kg (177 lb)    PHYSICAL EXAMINATION:  Physical Exam  Vitals reviewed. Constitutional: She is oriented to person, place, and time. She appears well-developed and well-nourished. No distress.  HENT:  Head:  Normocephalic and atraumatic.  Mouth/Throat: Oropharynx is clear and moist.  Eyes: Conjunctivae and EOM are normal. Pupils are equal, round, and reactive to light. No scleral icterus.  Neck: Normal range of motion. Neck supple. No JVD present. No thyromegaly present.  Cardiovascular: Normal rate, regular rhythm and intact distal pulses.  Exam reveals no gallop and no friction rub.   No murmur heard. Respiratory: Effort normal and breath sounds normal. No respiratory distress. She has no wheezes. She has no rales.  GI: Soft. Bowel sounds are normal. She exhibits no distension. There is no tenderness.  Musculoskeletal: Normal range of motion. She exhibits no edema.  No arthritis, no gout  Lymphadenopathy:    She has no cervical adenopathy.  Neurological: She is alert and oriented to person, place, and time. No cranial nerve deficit.  Neurologic: Cranial nerves II-XII intact, Sensation intact to light touch/pinprick, 3/5 strength in BL lower extremities, 4/5 strength in BL upper extremities  Skin: Skin is warm and dry. No rash noted. No erythema.  Psychiatric: She has a normal mood and affect. Her behavior is normal. Judgment and thought content normal.    LABORATORY PANEL:   CBC  Recent Labs Lab 05/14/16 1355  WBC 3.6  HGB 12.9  HCT 37.9  PLT 127*   ------------------------------------------------------------------------------------------------------------------  Chemistries   Recent Labs Lab 05/14/16 1355  NA 142  K 4.1  CL 106  CO2 28  GLUCOSE 109*  BUN 13  CREATININE 0.52  CALCIUM 9.6   ------------------------------------------------------------------------------------------------------------------  Cardiac Enzymes  Recent Labs Lab 05/14/16 1355  TROPONINI <0.03   ------------------------------------------------------------------------------------------------------------------  RADIOLOGY:  Ct Head Wo Contrast  Result Date: 05/14/2016 CLINICAL DATA:   Patient with history of MS states she has increased weakness x 3 to 4 days. EXAM: CT HEAD WITHOUT CONTRAST TECHNIQUE: Contiguous axial images were obtained from the base of the skull through the vertex without intravenous contrast. COMPARISON:  CT head dated 02/28/2011 and brain MRI dated 10/07/2015. FINDINGS: Brain: Mild generalized parenchymal atrophy with commensurate dilatation of the sulci. Ventricles are stable in size and configuration. Patchy areas of low density within the bilateral  periventricular and subcortical white matter regions bilaterally, compatible with the white matter changes demonstrated on earlier MRI related to patient's multiple sclerosis. There is no mass, hemorrhage, edema or other evidence of acute parenchymal abnormality. No extra-axial hemorrhage. Vascular: There are chronic calcified atherosclerotic changes of the large vessels at the skull base. No unexpected hyperdense vessel. Skull: Normal. Negative for fracture or focal lesion. Sinuses/Orbits: No acute finding. Other: None. IMPRESSION: 1. No acute findings.  No intracranial mass, hemorrhage or edema. 2. Stable white matter changes, consistent with sequela of patient's multiple sclerosis. Electronically Signed   By: Franki Cabot M.D.   On: 05/14/2016 16:44   Mr Brain W And Wo Contrast  Result Date: 05/14/2016 CLINICAL DATA:  Leg weakness for 5 days. History of multiple sclerosis, endocarditis, cervical spine stenosis, breast cancer, ACDF 2013. EXAM: MRI HEAD WITHOUT AND WITH CONTRAST MRI CERVICAL SPINE WITHOUT AND WITH CONTRAST MRI THORACIC SPINE WITHOUT AND WITH CONTRAST TECHNIQUE: Multiplanar, multiecho pulse sequences of the brain and surrounding structures, and cervical spine, to include the craniocervical junction and thoracic spine, were obtained without and with intravenous contrast. CONTRAST:  42mL MULTIHANCE GADOBENATE DIMEGLUMINE 529 MG/ML IV SOLN COMPARISON:  MRI of the head October 07, 2015, MRI of the cervical spine  August 18, 2013 and MRI of the thoracic spine August 27, 2013 FINDINGS: MRI HEAD FINDINGS- mildly motion degraded examination. INTRACRANIAL CONTENTS: No reduced diffusion to suggest acute ischemia. No susceptibility artifact to suggest hemorrhage. Confluent supratentorial white matter FLAIR T2 hyperintensities, predominately radiating from the periventricular margin subcentimeter RIGHT frontal cortical lesion. At least 3 infratentorial white matter lesions. Patchy RIGHT thalamus to posterior limb of the internal capsular lesion is similar. Lesions demonstrate low T1 signal compatible with black holes of demyelination. Dominant 2.4 cm LEFT periatrial white matter lesion. No enhancing lesions, intracranial masses or mass effect. No parenchymal brain volume loss for age. No abnormal extra-axial fluid collections or abnormal enhancement. VASCULAR: Normal major intracranial vascular flow voids present at skull base. SKULL AND UPPER CERVICAL SPINE: No abnormal sellar expansion. No suspicious calvarial bone marrow signal. Craniocervical junction maintained. SINUSES/ORBITS: The mastoid air-cells and included paranasal sinuses are well-aerated.The included ocular globes and orbital contents are non-suspicious. OTHER: None. MRI CERVICAL SPINE FINDINGS- moderately motion degraded examination. ALIGNMENT: Maintained cervical lordosis.  No malalignment. VERTEBRAE/DISCS: Vertebral bodies are intact. Status post C5-6 and C6-7 ACDF with suspected nonunion at C6-7. Intervertebral disc morphology generally maintained with decreased T2 signal compatible with mild desiccation. Mild chronic discogenic endplate changes K4-4. No acute osseous process. No suspicious osseous or disc enhancement. CORD:Patchy bright T2 and STIR signal throughout the spinal cord. No myelomalacia or syrinx. No abnormal cord, leptomeningeal upper dural enhancement. POSTERIOR FOSSA, VERTEBRAL ARTERIES, PARASPINAL TISSUES: No MR findings of ligamentous injury.  Vertebral artery flow voids present. Included posterior fossa and paraspinal soft tissues are normal. DISC LEVELS: C2-3: Small broad-based disc bulge, uncovertebral hypertrophy. Mild RIGHT, severe LEFT facet arthropathy similar to prior examination with trace LEFT facet effusion. Mild canal stenosis. Mild LEFT neural foraminal narrowing. C3-4: Small broad-based central disc protrusion, uncovertebral hypertrophy. Severe RIGHT, moderate LEFT facet arthropathy. Mild canal stenosis. Mild bilateral neural foraminal narrowing. C4-5: 3 mm broad-based RIGHT central disc protrusion. Uncovertebral hypertrophy and mild to moderate facet arthropathy. Moderate canal stenosis. Severe RIGHT, mild LEFT neural foraminal narrowing. C5-6: ACDF. Mild facet arthropathy without canal stenosis. Mild RIGHT neural foraminal narrowing. C6-7: ACDF. No canal stenosis. Uncovertebral hypertrophy without neural foraminal narrowing. C7-T1: No disc bulge, canal stenosis nor neural  foraminal narrowing. Mild RIGHT and moderate LEFT facet arthropathy. MRI THORACIC SPINE FINDINGS - multiple sequences are mildly motion degraded. ALIGNMENT: Maintenance of the thoracic kyphosis. No malalignment. VERTEBRAE/DISCS: Vertebral bodies are intact. Intervertebral discs morphology and signal are normal. Multilevel mild chronic discogenic endplate changes. No abnormal bone marrow signal. No abnormal osseous or discal enhancement. CORD: Slightly heterogeneous upper thoracic spinal cord signal though, limited by patient motion. No abnormal cord, leptomeningeal or epidural enhancement. On prior examination there were patchy demyelinating plaques through the level of the conus medullaris which are not evident today, likely obscured by motion. PREVERTEBRAL AND PARASPINAL SOFT TISSUES: Mild epidural lipomatosis slightly effacing the dorsal thecal sac. DISC LEVELS: T1-2 through T5-6: No disc bulge, canal stenosis nor neural foraminal narrowing. T6-7 broad-based small LEFT  central disc protrusion without canal stenosis or neural foraminal narrowing. T7-8 through T12-L1: No disc bulge, canal stenosis nor neural foraminal narrowing. IMPRESSION: MRI HEAD: Stable examination: Severe chronic demyelination without new lesions or acute inflammation. No parenchymal brain volume loss for age. MRI CERVICAL SPINE: Moderately motion degraded examination. Chronic demyelination without myelomalacia. No enhancement to suggest acute inflammation. Status post C5 through C7 ACDF with suspected nonunion C6-7, recommend cervical spine radiographs. 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 Mild canal stenosis C2-3 and C3-4. Multilevel neural foraminal narrowing: Severe on the RIGHT at C4-5. MRI THORACIC SPINE: Suspected chronic demyelination of the upper thoracic spinal cord though, limited assessment due to patient motion. No enhancement to suggest acute inflammation. Early degenerative change of thoracic spine without canal stenosis or neural foraminal narrowing. Electronically Signed   By: Elon Alas M.D.   On: 05/14/2016 21:51   Mr Cervical Spine W Or Wo Contrast  Result Date: 05/14/2016 CLINICAL DATA:  Leg weakness for 5 days. History of multiple sclerosis, endocarditis, cervical spine stenosis, breast cancer, ACDF 2013. EXAM: MRI HEAD WITHOUT AND WITH CONTRAST MRI CERVICAL SPINE WITHOUT AND WITH CONTRAST MRI THORACIC SPINE WITHOUT AND WITH CONTRAST TECHNIQUE: Multiplanar, multiecho pulse sequences of the brain and surrounding structures, and cervical spine, to include the craniocervical junction and thoracic spine, were obtained without and with intravenous contrast. CONTRAST:  25mL MULTIHANCE GADOBENATE DIMEGLUMINE 529 MG/ML IV SOLN COMPARISON:  MRI of the head October 07, 2015, MRI of the cervical spine August 18, 2013 and MRI of the thoracic spine August 27, 2013 FINDINGS: MRI HEAD FINDINGS- mildly motion degraded examination. INTRACRANIAL CONTENTS: No reduced  diffusion to suggest acute ischemia. No susceptibility artifact to suggest hemorrhage. Confluent supratentorial white matter FLAIR T2 hyperintensities, predominately radiating from the periventricular margin subcentimeter RIGHT frontal cortical lesion. At least 3 infratentorial white matter lesions. Patchy RIGHT thalamus to posterior limb of the internal capsular lesion is similar. Lesions demonstrate low T1 signal compatible with black holes of demyelination. Dominant 2.4 cm LEFT periatrial white matter lesion. No enhancing lesions, intracranial masses or mass effect. No parenchymal brain volume loss for age. No abnormal extra-axial fluid collections or abnormal enhancement. VASCULAR: Normal major intracranial vascular flow voids present at skull base. SKULL AND UPPER CERVICAL SPINE: No abnormal sellar expansion. No suspicious calvarial bone marrow signal. Craniocervical junction maintained. SINUSES/ORBITS: The mastoid air-cells and included paranasal sinuses are well-aerated.The included ocular globes and orbital contents are non-suspicious. OTHER: None. MRI CERVICAL SPINE FINDINGS- moderately motion degraded examination. ALIGNMENT: Maintained cervical lordosis.  No malalignment. VERTEBRAE/DISCS: Vertebral bodies are intact. Status post C5-6 and C6-7 ACDF with suspected nonunion at C6-7. Intervertebral disc morphology generally maintained with decreased T2 signal compatible with mild  desiccation. Mild chronic discogenic endplate changes E3-3. No acute osseous process. No suspicious osseous or disc enhancement. CORD:Patchy bright T2 and STIR signal throughout the spinal cord. No myelomalacia or syrinx. No abnormal cord, leptomeningeal upper dural enhancement. POSTERIOR FOSSA, VERTEBRAL ARTERIES, PARASPINAL TISSUES: No MR findings of ligamentous injury. Vertebral artery flow voids present. Included posterior fossa and paraspinal soft tissues are normal. DISC LEVELS: C2-3: Small broad-based disc bulge, uncovertebral  hypertrophy. Mild RIGHT, severe LEFT facet arthropathy similar to prior examination with trace LEFT facet effusion. Mild canal stenosis. Mild LEFT neural foraminal narrowing. C3-4: Small broad-based central disc protrusion, uncovertebral hypertrophy. Severe RIGHT, moderate LEFT facet arthropathy. Mild canal stenosis. Mild bilateral neural foraminal narrowing. C4-5: 3 mm broad-based RIGHT central disc protrusion. Uncovertebral hypertrophy and mild to moderate facet arthropathy. Moderate canal stenosis. Severe RIGHT, mild LEFT neural foraminal narrowing. C5-6: ACDF. Mild facet arthropathy without canal stenosis. Mild RIGHT neural foraminal narrowing. C6-7: ACDF. No canal stenosis. Uncovertebral hypertrophy without neural foraminal narrowing. C7-T1: No disc bulge, canal stenosis nor neural foraminal narrowing. Mild RIGHT and moderate LEFT facet arthropathy. MRI THORACIC SPINE FINDINGS - multiple sequences are mildly motion degraded. ALIGNMENT: Maintenance of the thoracic kyphosis. No malalignment. VERTEBRAE/DISCS: Vertebral bodies are intact. Intervertebral discs morphology and signal are normal. Multilevel mild chronic discogenic endplate changes. No abnormal bone marrow signal. No abnormal osseous or discal enhancement. CORD: Slightly heterogeneous upper thoracic spinal cord signal though, limited by patient motion. No abnormal cord, leptomeningeal or epidural enhancement. On prior examination there were patchy demyelinating plaques through the level of the conus medullaris which are not evident today, likely obscured by motion. PREVERTEBRAL AND PARASPINAL SOFT TISSUES: Mild epidural lipomatosis slightly effacing the dorsal thecal sac. DISC LEVELS: T1-2 through T5-6: No disc bulge, canal stenosis nor neural foraminal narrowing. T6-7 broad-based small LEFT central disc protrusion without canal stenosis or neural foraminal narrowing. T7-8 through T12-L1: No disc bulge, canal stenosis nor neural foraminal narrowing.  IMPRESSION: MRI HEAD: Stable examination: Severe chronic demyelination without new lesions or acute inflammation. No parenchymal brain volume loss for age. MRI CERVICAL SPINE: Moderately motion degraded examination. Chronic demyelination without myelomalacia. No enhancement to suggest acute inflammation. Status post C5 through C7 ACDF with suspected nonunion C6-7, recommend cervical spine radiographs. 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 Mild canal stenosis C2-3 and C3-4. Multilevel neural foraminal narrowing: Severe on the RIGHT at C4-5. MRI THORACIC SPINE: Suspected chronic demyelination of the upper thoracic spinal cord though, limited assessment due to patient motion. No enhancement to suggest acute inflammation. Early degenerative change of thoracic spine without canal stenosis or neural foraminal narrowing. Electronically Signed   By: Elon Alas M.D.   On: 05/14/2016 21:51   Mr Thoracic Spine W Wo Contrast  Result Date: 05/14/2016 CLINICAL DATA:  Leg weakness for 5 days. History of multiple sclerosis, endocarditis, cervical spine stenosis, breast cancer, ACDF 2013. EXAM: MRI HEAD WITHOUT AND WITH CONTRAST MRI CERVICAL SPINE WITHOUT AND WITH CONTRAST MRI THORACIC SPINE WITHOUT AND WITH CONTRAST TECHNIQUE: Multiplanar, multiecho pulse sequences of the brain and surrounding structures, and cervical spine, to include the craniocervical junction and thoracic spine, were obtained without and with intravenous contrast. CONTRAST:  52mL MULTIHANCE GADOBENATE DIMEGLUMINE 529 MG/ML IV SOLN COMPARISON:  MRI of the head October 07, 2015, MRI of the cervical spine August 18, 2013 and MRI of the thoracic spine August 27, 2013 FINDINGS: MRI HEAD FINDINGS- mildly motion degraded examination. INTRACRANIAL CONTENTS: No reduced diffusion to suggest acute ischemia. No susceptibility artifact  to suggest hemorrhage. Confluent supratentorial white matter FLAIR T2 hyperintensities, predominately  radiating from the periventricular margin subcentimeter RIGHT frontal cortical lesion. At least 3 infratentorial white matter lesions. Patchy RIGHT thalamus to posterior limb of the internal capsular lesion is similar. Lesions demonstrate low T1 signal compatible with black holes of demyelination. Dominant 2.4 cm LEFT periatrial white matter lesion. No enhancing lesions, intracranial masses or mass effect. No parenchymal brain volume loss for age. No abnormal extra-axial fluid collections or abnormal enhancement. VASCULAR: Normal major intracranial vascular flow voids present at skull base. SKULL AND UPPER CERVICAL SPINE: No abnormal sellar expansion. No suspicious calvarial bone marrow signal. Craniocervical junction maintained. SINUSES/ORBITS: The mastoid air-cells and included paranasal sinuses are well-aerated.The included ocular globes and orbital contents are non-suspicious. OTHER: None. MRI CERVICAL SPINE FINDINGS- moderately motion degraded examination. ALIGNMENT: Maintained cervical lordosis.  No malalignment. VERTEBRAE/DISCS: Vertebral bodies are intact. Status post C5-6 and C6-7 ACDF with suspected nonunion at C6-7. Intervertebral disc morphology generally maintained with decreased T2 signal compatible with mild desiccation. Mild chronic discogenic endplate changes P8-2. No acute osseous process. No suspicious osseous or disc enhancement. CORD:Patchy bright T2 and STIR signal throughout the spinal cord. No myelomalacia or syrinx. No abnormal cord, leptomeningeal upper dural enhancement. POSTERIOR FOSSA, VERTEBRAL ARTERIES, PARASPINAL TISSUES: No MR findings of ligamentous injury. Vertebral artery flow voids present. Included posterior fossa and paraspinal soft tissues are normal. DISC LEVELS: C2-3: Small broad-based disc bulge, uncovertebral hypertrophy. Mild RIGHT, severe LEFT facet arthropathy similar to prior examination with trace LEFT facet effusion. Mild canal stenosis. Mild LEFT neural foraminal  narrowing. C3-4: Small broad-based central disc protrusion, uncovertebral hypertrophy. Severe RIGHT, moderate LEFT facet arthropathy. Mild canal stenosis. Mild bilateral neural foraminal narrowing. C4-5: 3 mm broad-based RIGHT central disc protrusion. Uncovertebral hypertrophy and mild to moderate facet arthropathy. Moderate canal stenosis. Severe RIGHT, mild LEFT neural foraminal narrowing. C5-6: ACDF. Mild facet arthropathy without canal stenosis. Mild RIGHT neural foraminal narrowing. C6-7: ACDF. No canal stenosis. Uncovertebral hypertrophy without neural foraminal narrowing. C7-T1: No disc bulge, canal stenosis nor neural foraminal narrowing. Mild RIGHT and moderate LEFT facet arthropathy. MRI THORACIC SPINE FINDINGS - multiple sequences are mildly motion degraded. ALIGNMENT: Maintenance of the thoracic kyphosis. No malalignment. VERTEBRAE/DISCS: Vertebral bodies are intact. Intervertebral discs morphology and signal are normal. Multilevel mild chronic discogenic endplate changes. No abnormal bone marrow signal. No abnormal osseous or discal enhancement. CORD: Slightly heterogeneous upper thoracic spinal cord signal though, limited by patient motion. No abnormal cord, leptomeningeal or epidural enhancement. On prior examination there were patchy demyelinating plaques through the level of the conus medullaris which are not evident today, likely obscured by motion. PREVERTEBRAL AND PARASPINAL SOFT TISSUES: Mild epidural lipomatosis slightly effacing the dorsal thecal sac. DISC LEVELS: T1-2 through T5-6: No disc bulge, canal stenosis nor neural foraminal narrowing. T6-7 broad-based small LEFT central disc protrusion without canal stenosis or neural foraminal narrowing. T7-8 through T12-L1: No disc bulge, canal stenosis nor neural foraminal narrowing. IMPRESSION: MRI HEAD: Stable examination: Severe chronic demyelination without new lesions or acute inflammation. No parenchymal brain volume loss for age. MRI  CERVICAL SPINE: Moderately motion degraded examination. Chronic demyelination without myelomalacia. No enhancement to suggest acute inflammation. Status post C5 through C7 ACDF with suspected nonunion C6-7, recommend cervical spine radiographs. 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 Mild canal stenosis C2-3 and C3-4. Multilevel neural foraminal narrowing: Severe on the RIGHT at C4-5. MRI THORACIC SPINE: Suspected chronic demyelination of the upper thoracic spinal cord though, limited  assessment due to patient motion. No enhancement to suggest acute inflammation. Early degenerative change of thoracic spine without canal stenosis or neural foraminal narrowing. Electronically Signed   By: Elon Alas M.D.   On: 05/14/2016 21:51    EKG:   Orders placed or performed during the hospital encounter of 05/14/16  . ED EKG  . ED EKG  . EKG 12-Lead  . EKG 12-Lead    IMPRESSION AND PLAN:  Principal Problem:   MS (multiple sclerosis) ([place]) - patient's clinical picture is consistent with MS flare. We will treat her with high-dose steroids, and get a neurology consult the morning. Active Problems:   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[place]) - home dose antiepileptics   All the records are reviewed and case discussed with ED provider. Management plans discussed with the patient and/or family.  DVT PROPHYLAXIS: SubQ lovenox  GI PROPHYLAXIS: None  ADMISSION STATUS: Observation  CODE STATUS: Full Code Status History    This patient does not have a recorded code status. Please follow your organizational policy for patients in this situation.      TOTAL TIME TAKING CARE OF THIS PATIENT: 40 minutes.   Jannifer Franklin, Leeann Bady [place] 05/14/2016, 11:22 PM  [facility]  Office  [phone]  CC: Primary care physician; Enid Derry, MD  Note:  This document was prepared using Dragon voice recognition software and may include unintentional dictation  errors.

## 2016-05-14 NOTE — ED Notes (Signed)
Patient still in MRI.  Checked on patient's family, denies needs.

## 2016-05-14 NOTE — ED Triage Notes (Signed)
Patient with history of MS states she has increased weakness x 3 to 4 days. States she has been up to attempt to urinate frequently but unable to void when she gets to commode. Has voided today. States normally gets around with walker but now unable to stand.

## 2016-05-15 DIAGNOSIS — R569 Unspecified convulsions: Secondary | ICD-10-CM | POA: Diagnosis not present

## 2016-05-15 DIAGNOSIS — G35 Multiple sclerosis: Secondary | ICD-10-CM | POA: Diagnosis not present

## 2016-05-15 DIAGNOSIS — N390 Urinary tract infection, site not specified: Secondary | ICD-10-CM | POA: Diagnosis not present

## 2016-05-15 LAB — CBC
HEMATOCRIT: 34.6 % — AB (ref 35.0–47.0)
HEMOGLOBIN: 11.9 g/dL — AB (ref 12.0–16.0)
MCH: 30.8 pg (ref 26.0–34.0)
MCHC: 34.3 g/dL (ref 32.0–36.0)
MCV: 89.9 fL (ref 80.0–100.0)
Platelets: 111 10*3/uL — ABNORMAL LOW (ref 150–440)
RBC: 3.85 MIL/uL (ref 3.80–5.20)
RDW: 12.2 % (ref 11.5–14.5)
WBC: 3.4 10*3/uL — AB (ref 3.6–11.0)

## 2016-05-15 LAB — BASIC METABOLIC PANEL
ANION GAP: 8 (ref 5–15)
BUN: 12 mg/dL (ref 6–20)
CO2: 26 mmol/L (ref 22–32)
Calcium: 9 mg/dL (ref 8.9–10.3)
Chloride: 104 mmol/L (ref 101–111)
Creatinine, Ser: 0.42 mg/dL — ABNORMAL LOW (ref 0.44–1.00)
GFR calc Af Amer: >60 mL/min (ref >60)
GFR calc non Af Amer: >60 mL/min (ref >60)
GLUCOSE: 119 mg/dL — AB (ref 65–99)
Potassium: 3.6 mmol/L (ref 3.5–5.1)
SODIUM: 138 mmol/L (ref 135–145)

## 2016-05-15 MED ORDER — CEFTRIAXONE SODIUM 1 G IJ SOLR: 1.0000 g | INTRAMUSCULAR | Status: DC

## 2016-05-15 MED ORDER — ENOXAPARIN SODIUM 40 MG/0.4ML ~~LOC~~ SOLN
40.0000 mg | SUBCUTANEOUS | Status: DC
  Administered 2016-05-15 – 2016-05-16 (×2): 40 mg via SUBCUTANEOUS
  Filled 2016-05-15 (×2): qty 0.4

## 2016-05-15 MED ORDER — AMANTADINE HCL 100 MG PO CAPS
100.0000 mg | ORAL_CAPSULE | Freq: Three times a day (TID) | ORAL | Status: DC
  Administered 2016-05-15 – 2016-05-17 (×7): 100 mg via ORAL
  Filled 2016-05-15 (×7): qty 1

## 2016-05-15 MED ORDER — OCUVITE-LUTEIN PO CAPS
1.0000 | ORAL_CAPSULE | Freq: Two times a day (BID) | ORAL | Status: DC
  Administered 2016-05-15 – 2016-05-17 (×4): 1 via ORAL
  Filled 2016-05-15 (×5): qty 1

## 2016-05-15 MED ORDER — FENTANYL 50 MCG/HR TD PT72
50.0000 ug | MEDICATED_PATCH | TRANSDERMAL | Status: DC
  Administered 2016-05-15: 03:00:00 50 ug via TRANSDERMAL
  Filled 2016-05-15: qty 1

## 2016-05-15 MED ORDER — PROCHLORPERAZINE MALEATE 10 MG PO TABS
10.0000 mg | ORAL_TABLET | Freq: Four times a day (QID) | ORAL | Status: DC | PRN
  Filled 2016-05-15: qty 1

## 2016-05-15 MED ORDER — OXYCODONE HCL 5 MG PO TABS: 10.0000 mg | ORAL_TABLET | Freq: Four times a day (QID) | ORAL | Status: DC | PRN

## 2016-05-15 MED ORDER — SODIUM CHLORIDE 0.9 % IV SOLN
1000.0000 mg | Freq: Every day | INTRAVENOUS | Status: AC
  Administered 2016-05-16 – 2016-05-17 (×2): 1000 mg via INTRAVENOUS
  Filled 2016-05-15 (×3): qty 8

## 2016-05-15 MED ORDER — OXYCODONE HCL 5 MG PO TABS: 5.0000 mg | ORAL_TABLET | Freq: Four times a day (QID) | ORAL | Status: DC | PRN

## 2016-05-15 MED ORDER — BACLOFEN 10 MG PO TABS
10.0000 mg | ORAL_TABLET | Freq: Three times a day (TID) | ORAL | Status: DC
  Filled 2016-05-15: qty 2

## 2016-05-15 MED ORDER — LUTEIN-ZEAXANTHIN 25-5 MG PO CAPS: 1.0000 | ORAL_CAPSULE | Freq: Two times a day (BID) | ORAL | Status: DC

## 2016-05-15 MED ORDER — BACLOFEN 10 MG PO TABS
10.0000 mg | ORAL_TABLET | Freq: Three times a day (TID) | ORAL | Status: DC
  Administered 2016-05-15 – 2016-05-17 (×7): 10 mg via ORAL
  Filled 2016-05-15 (×6): qty 1

## 2016-05-15 MED ORDER — DEXTROSE 5 % IV SOLN
1.0000 g | INTRAVENOUS | Status: DC
  Administered 2016-05-15 – 2016-05-17 (×3): 1 g via INTRAVENOUS
  Filled 2016-05-15 (×3): qty 10

## 2016-05-15 MED ORDER — OXYBUTYNIN CHLORIDE ER 10 MG PO TB24
20.0000 mg | ORAL_TABLET | Freq: Every day | ORAL | Status: DC
  Administered 2016-05-15 – 2016-05-17 (×3): 20 mg via ORAL
  Filled 2016-05-15 (×3): qty 2

## 2016-05-15 MED ORDER — ONDANSETRON HCL 4 MG/2ML IJ SOLN: 4.0000 mg | Freq: Four times a day (QID) | INTRAMUSCULAR | Status: DC | PRN

## 2016-05-15 MED ORDER — ACETAMINOPHEN 325 MG PO TABS: 650.0000 mg | ORAL_TABLET | Freq: Four times a day (QID) | ORAL | Status: DC | PRN

## 2016-05-15 MED ORDER — SODIUM CHLORIDE 0.9 % IV SOLN
1000.0000 mg | Freq: Once | INTRAVENOUS | Status: AC
  Administered 2016-05-15: 1000 mg via INTRAVENOUS
  Filled 2016-05-15: qty 8

## 2016-05-15 MED ORDER — ACETAMINOPHEN 650 MG RE SUPP: 650.0000 mg | Freq: Four times a day (QID) | RECTAL | Status: DC | PRN

## 2016-05-15 MED ORDER — ONDANSETRON HCL 4 MG PO TABS: 4.0000 mg | ORAL_TABLET | Freq: Four times a day (QID) | ORAL | Status: DC | PRN

## 2016-05-15 MED ORDER — LEVETIRACETAM 500 MG PO TABS
500.0000 mg | ORAL_TABLET | Freq: Two times a day (BID) | ORAL | Status: DC
  Administered 2016-05-15 – 2016-05-17 (×6): 500 mg via ORAL
  Filled 2016-05-15 (×6): qty 1

## 2016-05-15 NOTE — ED Notes (Signed)
Pt transport to 113 

## 2016-05-15 NOTE — Progress Notes (Signed)
[facility] [place] NAME: Rebecca [name]    MR#:  [id number]  DATE OF BIRTH:  April 30, 1960  SUBJECTIVE:  CHIEF COMPLAINT:   Chief Complaint  Patient presents with  . Weakness   Continues to feel weak. Afebrile. No seizures. No dysphagia or change in vision.  REVIEW OF SYSTEMS:    Review of Systems  Constitutional: Positive for malaise/fatigue. Negative for chills and fever.  HENT: Negative for sore throat.   Eyes: Negative for blurred vision, double vision and pain.  Respiratory: Negative for cough, hemoptysis, shortness of breath and wheezing.   Cardiovascular: Negative for chest pain, palpitations, orthopnea and leg swelling.  Gastrointestinal: Negative for abdominal pain, constipation, diarrhea, heartburn, nausea and vomiting.  Genitourinary: Negative for dysuria and hematuria.  Musculoskeletal: Positive for myalgias. Negative for back pain and joint pain.  Skin: Negative for rash.  Neurological: Positive for weakness. Negative for sensory change, speech change, focal weakness and headaches.  Endo/Heme/Allergies: Does not bruise/bleed easily.  Psychiatric/Behavioral: Negative for depression. The patient is not nervous/anxious.     DRUG ALLERGIES:   Allergies  Allergen Reactions  . Fentanyl Nausea And Vomiting and Nausea Only    vomiting Was given in PACU x3 each time patient got sick vomiting Was given in PACU x3 each time patient got sick  . Sulfa Antibiotics Hives and Other (See Comments)    Light headed, over heated    VITALS:  Blood pressure 122/69, pulse 85, temperature 97.8 F (36.6 C), temperature source Oral, resp. rate 18, height 5\' 4"  (1.626 m), weight 79.4 kg (175 lb), SpO2 98 %.  PHYSICAL EXAMINATION:   Physical Exam  GENERAL:  56 y.o.-year-old patient lying in the bed with no acute distress.  EYES: Pupils equal, round, reactive to light and accommodation. No scleral icterus. Extraocular muscles intact.  HEENT:  Head atraumatic, normocephalic. Oropharynx and nasopharynx clear.  NECK:  Supple, no jugular venous distention. No thyroid enlargement, no tenderness.  LUNGS: Normal breath sounds bilaterally, no wheezing, rales, rhonchi. No use of accessory muscles of respiration.  CARDIOVASCULAR: S1, S2 normal. No murmurs, rubs, or gallops.  ABDOMEN: Soft, nontender, nondistended. Bowel sounds present. No organomegaly or mass.  EXTREMITIES: No cyanosis, clubbing or edema b/l.    NEUROLOGIC: Cranial nerves II through XII are intact.  Sensations intact all over Motor strength 4+/5 in lower extremities. And 5-/ 5 in upper extremities. PSYCHIATRIC: The patient is alert and oriented x 3.  SKIN: No obvious rash, lesion, or ulcer.   LABORATORY PANEL:   CBC  Recent Labs Lab 05/15/16 0610  WBC 3.4*  HGB 11.9*  HCT 34.6*  PLT 111*   ------------------------------------------------------------------------------------------------------------------ Chemistries   Recent Labs Lab 05/15/16 0610  NA 138  K 3.6  CL 104  CO2 26  GLUCOSE 119*  BUN 12  CREATININE 0.42*  CALCIUM 9.0   ------------------------------------------------------------------------------------------------------------------  Cardiac Enzymes  Recent Labs Lab 05/14/16 1355  TROPONINI <0.03   ------------------------------------------------------------------------------------------------------------------  RADIOLOGY:  Ct Head Wo Contrast  Result Date: 05/14/2016 CLINICAL DATA:  Patient with history of MS states she has increased weakness x 3 to 4 days. EXAM: CT HEAD WITHOUT CONTRAST TECHNIQUE: Contiguous axial images were obtained from the base of the skull through the vertex without intravenous contrast. COMPARISON:  CT head dated 02/28/2011 and brain MRI dated 10/07/2015. FINDINGS: Brain: Mild generalized parenchymal atrophy with commensurate dilatation of the sulci. Ventricles are stable in size and configuration. Patchy areas  of low density within the  bilateral periventricular and subcortical white matter regions bilaterally, compatible with the white matter changes demonstrated on earlier MRI related to patient's multiple sclerosis. There is no mass, hemorrhage, edema or other evidence of acute parenchymal abnormality. No extra-axial hemorrhage. Vascular: There are chronic calcified atherosclerotic changes of the large vessels at the skull base. No unexpected hyperdense vessel. Skull: Normal. Negative for fracture or focal lesion. Sinuses/Orbits: No acute finding. Other: None. IMPRESSION: 1. No acute findings.  No intracranial mass, hemorrhage or edema. 2. Stable white matter changes, consistent with sequela of patient's multiple sclerosis. Electronically Signed   By: Franki Cabot M.D.   On: 05/14/2016 16:44   Mr Brain W And Wo Contrast  Result Date: 05/14/2016 CLINICAL DATA:  Leg weakness for 5 days. History of multiple sclerosis, endocarditis, cervical spine stenosis, breast cancer, ACDF 2013. EXAM: MRI HEAD WITHOUT AND WITH CONTRAST MRI CERVICAL SPINE WITHOUT AND WITH CONTRAST MRI THORACIC SPINE WITHOUT AND WITH CONTRAST TECHNIQUE: Multiplanar, multiecho pulse sequences of the brain and surrounding structures, and cervical spine, to include the craniocervical junction and thoracic spine, were obtained without and with intravenous contrast. CONTRAST:  30mL MULTIHANCE GADOBENATE DIMEGLUMINE 529 MG/ML IV SOLN COMPARISON:  MRI of the head October 07, 2015, MRI of the cervical spine August 18, 2013 and MRI of the thoracic spine August 27, 2013 FINDINGS: MRI HEAD FINDINGS- mildly motion degraded examination. INTRACRANIAL CONTENTS: No reduced diffusion to suggest acute ischemia. No susceptibility artifact to suggest hemorrhage. Confluent supratentorial white matter FLAIR T2 hyperintensities, predominately radiating from the periventricular margin subcentimeter RIGHT frontal cortical lesion. At least 3 infratentorial white matter lesions.  Patchy RIGHT thalamus to posterior limb of the internal capsular lesion is similar. Lesions demonstrate low T1 signal compatible with black holes of demyelination. Dominant 2.4 cm LEFT periatrial white matter lesion. No enhancing lesions, intracranial masses or mass effect. No parenchymal brain volume loss for age. No abnormal extra-axial fluid collections or abnormal enhancement. VASCULAR: Normal major intracranial vascular flow voids present at skull base. SKULL AND UPPER CERVICAL SPINE: No abnormal sellar expansion. No suspicious calvarial bone marrow signal. Craniocervical junction maintained. SINUSES/ORBITS: The mastoid air-cells and included paranasal sinuses are well-aerated.The included ocular globes and orbital contents are non-suspicious. OTHER: None. MRI CERVICAL SPINE FINDINGS- moderately motion degraded examination. ALIGNMENT: Maintained cervical lordosis.  No malalignment. VERTEBRAE/DISCS: Vertebral bodies are intact. Status post C5-6 and C6-7 ACDF with suspected nonunion at C6-7. Intervertebral disc morphology generally maintained with decreased T2 signal compatible with mild desiccation. Mild chronic discogenic endplate changes G8-6. No acute osseous process. No suspicious osseous or disc enhancement. CORD:Patchy bright T2 and STIR signal throughout the spinal cord. No myelomalacia or syrinx. No abnormal cord, leptomeningeal upper dural enhancement. POSTERIOR FOSSA, VERTEBRAL ARTERIES, PARASPINAL TISSUES: No MR findings of ligamentous injury. Vertebral artery flow voids present. Included posterior fossa and paraspinal soft tissues are normal. DISC LEVELS: C2-3: Small broad-based disc bulge, uncovertebral hypertrophy. Mild RIGHT, severe LEFT facet arthropathy similar to prior examination with trace LEFT facet effusion. Mild canal stenosis. Mild LEFT neural foraminal narrowing. C3-4: Small broad-based central disc protrusion, uncovertebral hypertrophy. Severe RIGHT, moderate LEFT facet arthropathy.  Mild canal stenosis. Mild bilateral neural foraminal narrowing. C4-5: 3 mm broad-based RIGHT central disc protrusion. Uncovertebral hypertrophy and mild to moderate facet arthropathy. Moderate canal stenosis. Severe RIGHT, mild LEFT neural foraminal narrowing. C5-6: ACDF. Mild facet arthropathy without canal stenosis. Mild RIGHT neural foraminal narrowing. C6-7: ACDF. No canal stenosis. Uncovertebral hypertrophy without neural foraminal narrowing. C7-T1: No disc bulge, canal stenosis nor  neural foraminal narrowing. Mild RIGHT and moderate LEFT facet arthropathy. MRI THORACIC SPINE FINDINGS - multiple sequences are mildly motion degraded. ALIGNMENT: Maintenance of the thoracic kyphosis. No malalignment. VERTEBRAE/DISCS: Vertebral bodies are intact. Intervertebral discs morphology and signal are normal. Multilevel mild chronic discogenic endplate changes. No abnormal bone marrow signal. No abnormal osseous or discal enhancement. CORD: Slightly heterogeneous upper thoracic spinal cord signal though, limited by patient motion. No abnormal cord, leptomeningeal or epidural enhancement. On prior examination there were patchy demyelinating plaques through the level of the conus medullaris which are not evident today, likely obscured by motion. PREVERTEBRAL AND PARASPINAL SOFT TISSUES: Mild epidural lipomatosis slightly effacing the dorsal thecal sac. DISC LEVELS: T1-2 through T5-6: No disc bulge, canal stenosis nor neural foraminal narrowing. T6-7 broad-based small LEFT central disc protrusion without canal stenosis or neural foraminal narrowing. T7-8 through T12-L1: No disc bulge, canal stenosis nor neural foraminal narrowing. IMPRESSION: MRI HEAD: Stable examination: Severe chronic demyelination without new lesions or acute inflammation. No parenchymal brain volume loss for age. MRI CERVICAL SPINE: Moderately motion degraded examination. Chronic demyelination without myelomalacia. No enhancement to suggest acute  inflammation. Status post C5 through C7 ACDF with suspected nonunion C6-7, recommend cervical spine radiographs. 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 Mild canal stenosis C2-3 and C3-4. Multilevel neural foraminal narrowing: Severe on the RIGHT at C4-5. MRI THORACIC SPINE: Suspected chronic demyelination of the upper thoracic spinal cord though, limited assessment due to patient motion. No enhancement to suggest acute inflammation. Early degenerative change of thoracic spine without canal stenosis or neural foraminal narrowing. Electronically Signed   By: Elon Alas M.D.   On: 05/14/2016 21:51   Mr Cervical Spine W Or Wo Contrast  Result Date: 05/14/2016 CLINICAL DATA:  Leg weakness for 5 days. History of multiple sclerosis, endocarditis, cervical spine stenosis, breast cancer, ACDF 2013. EXAM: MRI HEAD WITHOUT AND WITH CONTRAST MRI CERVICAL SPINE WITHOUT AND WITH CONTRAST MRI THORACIC SPINE WITHOUT AND WITH CONTRAST TECHNIQUE: Multiplanar, multiecho pulse sequences of the brain and surrounding structures, and cervical spine, to include the craniocervical junction and thoracic spine, were obtained without and with intravenous contrast. CONTRAST:  61mL MULTIHANCE GADOBENATE DIMEGLUMINE 529 MG/ML IV SOLN COMPARISON:  MRI of the head October 07, 2015, MRI of the cervical spine August 18, 2013 and MRI of the thoracic spine August 27, 2013 FINDINGS: MRI HEAD FINDINGS- mildly motion degraded examination. INTRACRANIAL CONTENTS: No reduced diffusion to suggest acute ischemia. No susceptibility artifact to suggest hemorrhage. Confluent supratentorial white matter FLAIR T2 hyperintensities, predominately radiating from the periventricular margin subcentimeter RIGHT frontal cortical lesion. At least 3 infratentorial white matter lesions. Patchy RIGHT thalamus to posterior limb of the internal capsular lesion is similar. Lesions demonstrate low T1 signal compatible with black holes of  demyelination. Dominant 2.4 cm LEFT periatrial white matter lesion. No enhancing lesions, intracranial masses or mass effect. No parenchymal brain volume loss for age. No abnormal extra-axial fluid collections or abnormal enhancement. VASCULAR: Normal major intracranial vascular flow voids present at skull base. SKULL AND UPPER CERVICAL SPINE: No abnormal sellar expansion. No suspicious calvarial bone marrow signal. Craniocervical junction maintained. SINUSES/ORBITS: The mastoid air-cells and included paranasal sinuses are well-aerated.The included ocular globes and orbital contents are non-suspicious. OTHER: None. MRI CERVICAL SPINE FINDINGS- moderately motion degraded examination. ALIGNMENT: Maintained cervical lordosis.  No malalignment. VERTEBRAE/DISCS: Vertebral bodies are intact. Status post C5-6 and C6-7 ACDF with suspected nonunion at C6-7. Intervertebral disc morphology generally maintained with decreased T2 signal compatible with  mild desiccation. Mild chronic discogenic endplate changes D6-6. No acute osseous process. No suspicious osseous or disc enhancement. CORD:Patchy bright T2 and STIR signal throughout the spinal cord. No myelomalacia or syrinx. No abnormal cord, leptomeningeal upper dural enhancement. POSTERIOR FOSSA, VERTEBRAL ARTERIES, PARASPINAL TISSUES: No MR findings of ligamentous injury. Vertebral artery flow voids present. Included posterior fossa and paraspinal soft tissues are normal. DISC LEVELS: C2-3: Small broad-based disc bulge, uncovertebral hypertrophy. Mild RIGHT, severe LEFT facet arthropathy similar to prior examination with trace LEFT facet effusion. Mild canal stenosis. Mild LEFT neural foraminal narrowing. C3-4: Small broad-based central disc protrusion, uncovertebral hypertrophy. Severe RIGHT, moderate LEFT facet arthropathy. Mild canal stenosis. Mild bilateral neural foraminal narrowing. C4-5: 3 mm broad-based RIGHT central disc protrusion. Uncovertebral hypertrophy and mild  to moderate facet arthropathy. Moderate canal stenosis. Severe RIGHT, mild LEFT neural foraminal narrowing. C5-6: ACDF. Mild facet arthropathy without canal stenosis. Mild RIGHT neural foraminal narrowing. C6-7: ACDF. No canal stenosis. Uncovertebral hypertrophy without neural foraminal narrowing. C7-T1: No disc bulge, canal stenosis nor neural foraminal narrowing. Mild RIGHT and moderate LEFT facet arthropathy. MRI THORACIC SPINE FINDINGS - multiple sequences are mildly motion degraded. ALIGNMENT: Maintenance of the thoracic kyphosis. No malalignment. VERTEBRAE/DISCS: Vertebral bodies are intact. Intervertebral discs morphology and signal are normal. Multilevel mild chronic discogenic endplate changes. No abnormal bone marrow signal. No abnormal osseous or discal enhancement. CORD: Slightly heterogeneous upper thoracic spinal cord signal though, limited by patient motion. No abnormal cord, leptomeningeal or epidural enhancement. On prior examination there were patchy demyelinating plaques through the level of the conus medullaris which are not evident today, likely obscured by motion. PREVERTEBRAL AND PARASPINAL SOFT TISSUES: Mild epidural lipomatosis slightly effacing the dorsal thecal sac. DISC LEVELS: T1-2 through T5-6: No disc bulge, canal stenosis nor neural foraminal narrowing. T6-7 broad-based small LEFT central disc protrusion without canal stenosis or neural foraminal narrowing. T7-8 through T12-L1: No disc bulge, canal stenosis nor neural foraminal narrowing. IMPRESSION: MRI HEAD: Stable examination: Severe chronic demyelination without new lesions or acute inflammation. No parenchymal brain volume loss for age. MRI CERVICAL SPINE: Moderately motion degraded examination. Chronic demyelination without myelomalacia. No enhancement to suggest acute inflammation. Status post C5 through C7 ACDF with suspected nonunion C6-7, recommend cervical spine radiographs. Moderate canal stenosis C4-5 consistent with  adjacent segment disease. Mild canal stenosis C2-3 and C3-4. Multilevel neural foraminal narrowing: Severe on the RIGHT at C4-5. MRI THORACIC SPINE: Suspected chronic demyelination of the upper thoracic spinal cord though, limited assessment due to patient motion. No enhancement to suggest acute inflammation. Early degenerative change of thoracic spine without canal stenosis or neural foraminal narrowing. Electronically Signed   By: Elon Alas M.D.   On: 05/14/2016 21:51   Mr Thoracic Spine W Wo Contrast  Result Date: 05/14/2016 CLINICAL DATA:  Leg weakness for 5 days. History of multiple sclerosis, endocarditis, cervical spine stenosis, breast cancer, ACDF 2013. EXAM: MRI HEAD WITHOUT AND WITH CONTRAST MRI CERVICAL SPINE WITHOUT AND WITH CONTRAST MRI THORACIC SPINE WITHOUT AND WITH CONTRAST TECHNIQUE: Multiplanar, multiecho pulse sequences of the brain and surrounding structures, and cervical spine, to include the craniocervical junction and thoracic spine, were obtained without and with intravenous contrast. CONTRAST:  39mL MULTIHANCE GADOBENATE DIMEGLUMINE 529 MG/ML IV SOLN COMPARISON:  MRI of the head October 07, 2015, MRI of the cervical spine August 18, 2013 and MRI of the thoracic spine August 27, 2013 FINDINGS: MRI HEAD FINDINGS- mildly motion degraded examination. INTRACRANIAL CONTENTS: No reduced diffusion to suggest acute ischemia. No susceptibility  artifact to suggest hemorrhage. Confluent supratentorial white matter FLAIR T2 hyperintensities, predominately radiating from the periventricular margin subcentimeter RIGHT frontal cortical lesion. At least 3 infratentorial white matter lesions. Patchy RIGHT thalamus to posterior limb of the internal capsular lesion is similar. Lesions demonstrate low T1 signal compatible with black holes of demyelination. Dominant 2.4 cm LEFT periatrial white matter lesion. No enhancing lesions, intracranial masses or mass effect. No parenchymal brain volume loss for  age. No abnormal extra-axial fluid collections or abnormal enhancement. VASCULAR: Normal major intracranial vascular flow voids present at skull base. SKULL AND UPPER CERVICAL SPINE: No abnormal sellar expansion. No suspicious calvarial bone marrow signal. Craniocervical junction maintained. SINUSES/ORBITS: The mastoid air-cells and included paranasal sinuses are well-aerated.The included ocular globes and orbital contents are non-suspicious. OTHER: None. MRI CERVICAL SPINE FINDINGS- moderately motion degraded examination. ALIGNMENT: Maintained cervical lordosis.  No malalignment. VERTEBRAE/DISCS: Vertebral bodies are intact. Status post C5-6 and C6-7 ACDF with suspected nonunion at C6-7. Intervertebral disc morphology generally maintained with decreased T2 signal compatible with mild desiccation. Mild chronic discogenic endplate changes V0-3. No acute osseous process. No suspicious osseous or disc enhancement. CORD:Patchy bright T2 and STIR signal throughout the spinal cord. No myelomalacia or syrinx. No abnormal cord, leptomeningeal upper dural enhancement. POSTERIOR FOSSA, VERTEBRAL ARTERIES, PARASPINAL TISSUES: No MR findings of ligamentous injury. Vertebral artery flow voids present. Included posterior fossa and paraspinal soft tissues are normal. DISC LEVELS: C2-3: Small broad-based disc bulge, uncovertebral hypertrophy. Mild RIGHT, severe LEFT facet arthropathy similar to prior examination with trace LEFT facet effusion. Mild canal stenosis. Mild LEFT neural foraminal narrowing. C3-4: Small broad-based central disc protrusion, uncovertebral hypertrophy. Severe RIGHT, moderate LEFT facet arthropathy. Mild canal stenosis. Mild bilateral neural foraminal narrowing. C4-5: 3 mm broad-based RIGHT central disc protrusion. Uncovertebral hypertrophy and mild to moderate facet arthropathy. Moderate canal stenosis. Severe RIGHT, mild LEFT neural foraminal narrowing. C5-6: ACDF. Mild facet arthropathy without canal  stenosis. Mild RIGHT neural foraminal narrowing. C6-7: ACDF. No canal stenosis. Uncovertebral hypertrophy without neural foraminal narrowing. C7-T1: No disc bulge, canal stenosis nor neural foraminal narrowing. Mild RIGHT and moderate LEFT facet arthropathy. MRI THORACIC SPINE FINDINGS - multiple sequences are mildly motion degraded. ALIGNMENT: Maintenance of the thoracic kyphosis. No malalignment. VERTEBRAE/DISCS: Vertebral bodies are intact. Intervertebral discs morphology and signal are normal. Multilevel mild chronic discogenic endplate changes. No abnormal bone marrow signal. No abnormal osseous or discal enhancement. CORD: Slightly heterogeneous upper thoracic spinal cord signal though, limited by patient motion. No abnormal cord, leptomeningeal or epidural enhancement. On prior examination there were patchy demyelinating plaques through the level of the conus medullaris which are not evident today, likely obscured by motion. PREVERTEBRAL AND PARASPINAL SOFT TISSUES: Mild epidural lipomatosis slightly effacing the dorsal thecal sac. DISC LEVELS: T1-2 through T5-6: No disc bulge, canal stenosis nor neural foraminal narrowing. T6-7 broad-based small LEFT central disc protrusion without canal stenosis or neural foraminal narrowing. T7-8 through T12-L1: No disc bulge, canal stenosis nor neural foraminal narrowing. IMPRESSION: MRI HEAD: Stable examination: Severe chronic demyelination without new lesions or acute inflammation. No parenchymal brain volume loss for age. MRI CERVICAL SPINE: Moderately motion degraded examination. Chronic demyelination without myelomalacia. No enhancement to suggest acute inflammation. Status post C5 through C7 ACDF with suspected nonunion C6-7, recommend cervical spine radiographs. 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 Mild canal stenosis C2-3 and C3-4. Multilevel neural foraminal narrowing: Severe on the RIGHT at C4-5. MRI THORACIC SPINE: Suspected  chronic demyelination of the upper thoracic spinal cord though,  limited assessment due to patient motion. No enhancement to suggest acute inflammation. Early degenerative change of thoracic spine without canal stenosis or neural foraminal narrowing. Electronically Signed   By: Elon Alas M.D.   On: 05/14/2016 21:51     ASSESSMENT AND PLAN:   * Generalized weakness. Possible multiple sclerosis flare versus UTI Patient is on treatment for MS flare and UTI. Consult physical therapy. At discharge will need physical therapy at home or discharge to skilled nursing facility.  At baseline patient ambulates with a walker. Uses wheelchair outside the house.  * UTI. No dysuria. Had some urinary frequency for days back. Afebrile. Normal WBC. Start IV ceftriaxone while cultures are pending.  * Possible multiple sclerosis flareup. On high dose IV steroids. Discussed with Dr. Doy Mince of neurology. MRI shows no acute demyelination.  * DVT prophylaxis with Lovenox.  All the records are reviewed and case discussed with Care Management/Social Workerr. Management plans discussed with the patient, family and they are in agreement.  CODE STATUS: FULL CODE  DVT Prophylaxis: SCDs  TOTAL TIME TAKING CARE OF THIS PATIENT: 40 minutes.   POSSIBLE D/C IN 1-2 DAYS, DEPENDING ON CLINICAL CONDITION.  Hillary Bow R M.D on 05/15/2016 at 11:06 AM  Between 7am to 6pm - Pager - [phone]  After 6pm go to [email or web address] - password [email or web address] [facility]  Office  [phone]  CC: Primary care physician; Enid Derry, MD  Note: This dictation was prepared with Dragon dictation along with smaller phrase technology. Any transcriptional errors that result from this process are unintentional.

## 2016-05-15 NOTE — Progress Notes (Signed)
Chaplain received an order to visit with pt I room 113. Provided information for an Advanced Directive.    05/15/16 1204  Clinical Encounter Type  Visited With Patient;Patient and family together  Visit Type Initial  Referral From Nurse  Consult/Referral To Chaplain  Spiritual Encounters  Spiritual Needs Other (Comment)

## 2016-05-15 NOTE — Care Management Obs Status (Signed)
[place] NOTIFICATION   Patient Details  Name: Rebecca [name] MRN: [id number] Date of Birth: 1960/09/30   Medicare Observation Status Notification Given:  Yes [facility] letter given)    Mardene Speak, RN 05/15/2016, 3:56 PM

## 2016-05-15 NOTE — Consult Note (Signed)
Reason for Consult:LE weakness Referring Physician: Sudini  CC: Lower extremity weakness  HPI: Rebecca [name] is an 56 y.o. female with a history of MS who reports that she has been experiencing extremity weakness for the past 4-5 days.  Progressed to the point that she was unable to ambulate.  The patient usually ambulates with a walker.  Also reports that he bladder function has been inconsistent which is unusual for her as well.    Past Medical History:  Diagnosis Date  . Aortic valve disease    Mild AS / AI - most recent Echo demonstrated tricuspid aortic valve.  . Bacterial endocarditis    History of .  Marland Kitchen Bilateral lower extremity edema    Noncardiac.  Chronic. LE Venous dopplers - negative for DVT.; Echocardiogram January 2016: Normal EF with normal wall motion and valve function. Only grade 1 diastolic dysfunction. EF 60-65%. Mild MR  . Breast cancer ([place]) 12-31-13   Right breast, 12:00, 1.5 cm, T1c,N0 invasive mammary carcinoma, triple negative. --> Rx with Chemo  . Cervical stenosis of spine   . Herpes zoster   . IBS (irritable bowel syndrome)   . Lymphedema    has legs wrapped at [facility]  . Multiple sclerosis ([place]) 2001   Walks from room to room @ home; but Wheelchair when going out.  Marland Kitchen Neuromuscular disorder ([place])    MS  . Seizures ([place])    Takes Keppra  . Syncope and collapse     Past Surgical History:  Procedure Laterality Date  . ANKLE SURGERY     Left  . ANKLE SURGERY    . ANTERIOR CERVICAL DECOMP/DISCECTOMY FUSION  11/17/2011   Procedure: ANTERIOR CERVICAL DECOMPRESSION/DISCECTOMY FUSION 2 LEVELS;  Surgeon: Erline Levine, MD;  Location: [facility];  Service: Neurosurgery;  Laterality: N/A;  Cervical Five-Six Six-Seven Anterior cervical decompression/diskectomy/fusion  . BREAST BIOPSY Right 12-31-13   invasive mammary  . BREAST SURGERY Right 02/03/2014   Right simple mastectomy with sentinel node biopsy.  . CHOLECYSTECTOMY    . COLONOSCOPY  2014  . Lower  extremity venous Dopplers  Feb 27, 2013   No LE DVT  . MASTECTOMY    . Port a cath insertion Right 01/19/2010  . PORT-A-CATH REMOVAL     right  . PORT-A-CATH REMOVAL Right 09/03/2013   Procedure: REMOVAL PORT-A-CATH;  Surgeon: Conrad Wharton, MD;  Location: [place];  Service: Vascular;  Laterality: Right;  . TRANSTHORACIC ECHOCARDIOGRAM  03/2013; 02/2014   a) Normal LV size and function with EF 60-65%.; Cannot exclude bicuspid aortic valve with mild AS and mild AI.; b) Normal EF with normal wall motion and valve function x Mild MR. G2 DD. EF 60-65%. Tricuspid AoV  . UPPER GI ENDOSCOPY  2014    Family History  Problem Relation Age of Onset  . Cancer Father     skin  . Heart disease Father   . Heart attack Father     heart attack in his 64's  . Thyroid disease Sister   . Ovarian cancer Cousin   . Breast cancer Maternal Aunt 60  . Breast cancer Maternal Grandmother 74    Social History:  reports that she has never smoked. She has never used smokeless tobacco. She reports that she does not drink alcohol or use drugs.  Allergies  Allergen Reactions  . Fentanyl Nausea And Vomiting and Nausea Only    vomiting Was given in PACU x3 each time patient got sick vomiting Was given in  PACU x3 each time patient got sick  . Sulfa Antibiotics Hives and Other (See Comments)    Light headed, over heated    Medications:  I have reviewed the patient's current medications. Prior to Admission:  Prescriptions Prior to Admission  Medication Sig Dispense Refill Last Dose  . amantadine (SYMMETREL) 100 MG capsule Take 1 capsule (100 mg total) by mouth 3 (three) times daily. 90 capsule 12 05/14/2016 at am  . baclofen (LIORESAL) 10 MG tablet Take 1-2 tablets (10-20 mg total) by mouth 3 (three) times daily. 150 tablet 12 05/13/2016 at prn  . Cholecalciferol (VITAMIN D3) 2000 UNITS capsule Take 2,000 Units by mouth daily.   05/13/2016 at lunch  . Cranberry 400 MG TABS Take 1 capsule by mouth daily. Also has  magnesium, calcium and vit c   05/14/2016 at am  . fentaNYL (DURAGESIC - DOSED MCG/HR) 50 MCG/HR Place 1 patch (50 mcg total) onto the skin every 3 (three) days. 10 patch 0 05/11/2016  . GLUCOSAMINE-CHONDROITIN PO Take 1 tablet by mouth 4 (four) times daily. STRENGTH: 1500-1200    05/13/2016 at am  . ibuprofen (ADVIL,MOTRIN) 600 MG tablet TAKE 1 TABLET (600 MG TOTAL) BY MOUTH EVERY 6 (SIX) HOURS AS NEEDED. 60 tablet 2 prn at prn  . Interferon Beta-1a (REBIF REBIDOSE) 44 MCG/0.5ML SOAJ Inject 0.5 mLs into the skin 3 (three) times a week. (Patient taking differently: Inject 0.5 mLs into the skin 3 (three) times a week. Monday, Wednesday and Friday) 18 mL 4 05/13/2016 at am  . levETIRAcetam (KEPPRA) 500 MG tablet Take 1 tablet (500 mg total) by mouth 2 (two) times daily. 180 tablet 4 05/14/2016 at am  . lidocaine-prilocaine (EMLA) cream Apply 1 application topically once. Apply to port then cover with saran wrap 1-2 hours before chemotherapy   prn at prn  . loratadine (CLARITIN) 10 MG tablet Take 10 mg by mouth daily.   05/14/2016 at am  . Lutein-Zeaxanthin 25-5 MG CAPS Take 1 capsule by mouth 2 (two) times daily.   05/13/2016 at pm  . Misc Natural Products (LEG VEIN & CIRCULATION) TABS Take 1 tablet by mouth 2 (two) times daily.    05/14/2016 at am  . Multiple Vitamins-Minerals (MULTIVITAMIN PO) Take 1 tablet by mouth daily.   05/13/2016 at lunch  . oxybutynin (DITROPAN-XL) 10 MG 24 hr tablet TAKE 2 TABLETS BY MOUTH DAILY 180 tablet 3 05/14/2016 at am  . Oxycodone HCl 10 MG TABS Take 1 tablet (10 mg total) by mouth every 6 (six) hours as needed. 120 tablet 0 prn at prn  . prochlorperazine (COMPAZINE) 10 MG tablet Take 1 tablet (10 mg total) by mouth every 6 (six) hours as needed. 60 tablet 5 prrn at prn  . Turmeric 500 MG CAPS Take 1 tablet by mouth daily.   05/14/2016 at am   Scheduled: . amantadine  100 mg Oral TID  . baclofen  10 mg Oral TID  . cefTRIAXone (ROCEPHIN) IVPB 1 gram/50 mL D5W  1 g Intravenous Q24H  .  enoxaparin (LOVENOX) injection  40 mg Subcutaneous Q24H  . fentaNYL  50 mcg Transdermal Q72H  . levETIRAcetam  500 mg Oral BID  . multivitamin-lutein  1 capsule Oral BID  . oxybutynin  20 mg Oral Daily    ROS: History obtained from the patient  General ROS: negative for - chills, fatigue, fever, night sweats, weight gain or weight loss Psychological ROS: negative for - behavioral disorder, hallucinations, memory difficulties, mood swings or suicidal  ideation Ophthalmic ROS: negative for - blurry vision, double vision, eye pain or loss of vision ENT ROS: negative for - epistaxis, nasal discharge, oral lesions, sore throat, tinnitus or vertigo Allergy and Immunology ROS: negative for - hives or itchy/watery eyes Hematological and Lymphatic ROS: negative for - bleeding problems, bruising or swollen lymph nodes Endocrine ROS: negative for - galactorrhea, hair pattern changes, polydipsia/polyuria or temperature intolerance Respiratory ROS: negative for - cough, hemoptysis, shortness of breath or wheezing Cardiovascular ROS: LE [place] Gastrointestinal ROS: negative for - abdominal pain, diarrhea, hematemesis, nausea/vomiting or stool incontinence Genito-Urinary ROS: as noted in HPI Musculoskeletal ROS: negative for - joint swelling or muscular weakness Neurological ROS: as noted in HPI Dermatological ROS: negative for rash and skin lesion changes  Physical Examination: Blood pressure 122/69, pulse 85, temperature 97.8 F (36.6 C), temperature source Oral, resp. rate 18, height 5\' 4"  (1.626 m), weight 79.4 kg (175 lb), SpO2 98 %.  HEENT-  Normocephalic, no lesions, without obvious abnormality.  Normal external eye and conjunctiva.  Normal TM's bilaterally.  Normal auditory canals and external ears. Normal external nose, mucus membranes and septum.  Normal pharynx. Cardiovascular- S1, S2 normal, pulses palpable throughout   Lungs- chest clear, no wheezing, rales, normal symmetric air  entry Abdomen- soft, non-tender; bowel sounds normal; no masses,  no organomegaly Extremities- LE edema Lymph-no adenopathy palpable Musculoskeletal-no joint tenderness, deformity or swelling Skin-dry, scaly lower extremity skin  Neurological Examination   Mental Status: Alert, oriented, thought content appropriate.  Speech fluent without evidence of aphasia.  Able to follow 3 step commands without difficulty. Cranial Nerves: II: Discs flat bilaterally; Visual fields grossly normal, pupils equal, round, reactive to light and accommodation III,IV, VI: ptosis not present, extra-ocular motions intact bilaterally V,VII: decrease in left NLF, facial light touch sensation normal bilaterally VIII: hearing normal bilaterally IX,X: gag reflex present XI: bilateral shoulder shrug XII: midline tongue extension Motor: Right : Upper extremity   5/5    Left:     Upper extremity   5-/5  Lower extremity   3/5     Lower extremity   3+/5 Increased tone throughout Sensory: Pinprick and light touch intact throughout, bilaterally Deep Tendon Reflexes: 1+ and symmetric with absent AJ's Plantars: Right: mute   Left: mute Cerebellar: Dysmetria with finger-to-nose testing Gait: not tested due to safety concerns    Laboratory Studies:   Basic Metabolic Panel:  Recent Labs Lab 05/14/16 1355 05/15/16 0610  NA 142 138  K 4.1 3.6  CL 106 104  CO2 28 26  GLUCOSE 109* 119*  BUN 13 12  CREATININE 0.52 0.42*  CALCIUM 9.6 9.0    Liver Function Tests: No results for input(s): AST, ALT, ALKPHOS, BILITOT, PROT, ALBUMIN in the last 168 hours. No results for input(s): LIPASE, AMYLASE in the last 168 hours. No results for input(s): AMMONIA in the last 168 hours.  CBC:  Recent Labs Lab 05/14/16 1355 05/15/16 0610  WBC 3.6 3.4*  HGB 12.9 11.9*  HCT 37.9 34.6*  MCV 90.3 89.9  PLT 127* 111*    Cardiac Enzymes:  Recent Labs Lab 05/14/16 1355  TROPONINI <0.03    BNP: Invalid input(s):  POCBNP  CBG: No results for input(s): GLUCAP in the last 168 hours.  Microbiology: Results for orders placed or performed during the hospital encounter of 11/11/11  Surgical pcr screen     Status: None   Collection Time: 11/11/11 10:18 AM  Result Value Ref Range Status   MRSA, PCR NEGATIVE  NEGATIVE Final   Staphylococcus aureus NEGATIVE NEGATIVE Final    Comment:        The Xpert SA Assay (FDA approved for NASAL specimens in patients over 47 years of age), is one component of a comprehensive surveillance program.  Test performance has been validated by [employer] for patients greater than or equal to 32 year old. It is not intended to diagnose infection nor to guide or monitor treatment.    Coagulation Studies: No results for input(s): LABPROT, INR in the last 72 hours.  Urinalysis:  Recent Labs Lab 05/14/16 1557  COLORURINE AMBER*  LABSPEC 1.027  PHURINE 5.0  GLUCOSEU NEGATIVE  HGBUR NEGATIVE  BILIRUBINUR NEGATIVE  KETONESUR 20*  PROTEINUR 30*  NITRITE NEGATIVE  LEUKOCYTESUR TRACE*    Lipid Panel:     Component Value Date/Time   CHOL 214 (H) 11/25/2015 1216   TRIG 109 11/25/2015 1216   HDL 58 11/25/2015 1216   CHOLHDL 3.7 11/25/2015 1216   VLDL 22 11/25/2015 1216   LDLCALC 134 (H) 11/25/2015 1216    HgbA1C: No results found for: HGBA1C  Urine Drug Screen:  No results found for: LABOPIA, COCAINSCRNUR, LABBENZ, AMPHETMU, THCU, LABBARB  Alcohol Level: No results for input(s): ETH in the last 168 hours.  Other results: EKG: sinus rhythm at 72 bpm.  Imaging: Ct Head Wo Contrast  Result Date: 05/14/2016 CLINICAL DATA:  Patient with history of MS states she has increased weakness x 3 to 4 days. EXAM: CT HEAD WITHOUT CONTRAST TECHNIQUE: Contiguous axial images were obtained from the base of the skull through the vertex without intravenous contrast. COMPARISON:  CT head dated 02/28/2011 and brain MRI dated 10/07/2015. FINDINGS: Brain: Mild generalized  parenchymal atrophy with commensurate dilatation of the sulci. Ventricles are stable in size and configuration. Patchy areas of low density within the bilateral periventricular and subcortical white matter regions bilaterally, compatible with the white matter changes demonstrated on earlier MRI related to patient's multiple sclerosis. There is no mass, hemorrhage, edema or other evidence of acute parenchymal abnormality. No extra-axial hemorrhage. Vascular: There are chronic calcified atherosclerotic changes of the large vessels at the skull base. No unexpected hyperdense vessel. Skull: Normal. Negative for fracture or focal lesion. Sinuses/Orbits: No acute finding. Other: None. IMPRESSION: 1. No acute findings.  No intracranial mass, hemorrhage or edema. 2. Stable white matter changes, consistent with sequela of patient's multiple sclerosis. Electronically Signed   By: Franki Cabot M.D.   On: 05/14/2016 16:44   Mr Brain W And Wo Contrast  Result Date: 05/14/2016 CLINICAL DATA:  Leg weakness for 5 days. History of multiple sclerosis, endocarditis, cervical spine stenosis, breast cancer, ACDF 2013. EXAM: MRI HEAD WITHOUT AND WITH CONTRAST MRI CERVICAL SPINE WITHOUT AND WITH CONTRAST MRI THORACIC SPINE WITHOUT AND WITH CONTRAST TECHNIQUE: Multiplanar, multiecho pulse sequences of the brain and surrounding structures, and cervical spine, to include the craniocervical junction and thoracic spine, were obtained without and with intravenous contrast. CONTRAST:  44mL MULTIHANCE GADOBENATE DIMEGLUMINE 529 MG/ML IV SOLN COMPARISON:  MRI of the head October 07, 2015, MRI of the cervical spine August 18, 2013 and MRI of the thoracic spine August 27, 2013 FINDINGS: MRI HEAD FINDINGS- mildly motion degraded examination. INTRACRANIAL CONTENTS: No reduced diffusion to suggest acute ischemia. No susceptibility artifact to suggest hemorrhage. Confluent supratentorial white matter FLAIR T2 hyperintensities, predominately radiating  from the periventricular margin subcentimeter RIGHT frontal cortical lesion. At least 3 infratentorial white matter lesions. Patchy RIGHT thalamus to posterior limb of the  internal capsular lesion is similar. Lesions demonstrate low T1 signal compatible with black holes of demyelination. Dominant 2.4 cm LEFT periatrial white matter lesion. No enhancing lesions, intracranial masses or mass effect. No parenchymal brain volume loss for age. No abnormal extra-axial fluid collections or abnormal enhancement. VASCULAR: Normal major intracranial vascular flow voids present at skull base. SKULL AND UPPER CERVICAL SPINE: No abnormal sellar expansion. No suspicious calvarial bone marrow signal. Craniocervical junction maintained. SINUSES/ORBITS: The mastoid air-cells and included paranasal sinuses are well-aerated.The included ocular globes and orbital contents are non-suspicious. OTHER: None. MRI CERVICAL SPINE FINDINGS- moderately motion degraded examination. ALIGNMENT: Maintained cervical lordosis.  No malalignment. VERTEBRAE/DISCS: Vertebral bodies are intact. Status post C5-6 and C6-7 ACDF with suspected nonunion at C6-7. Intervertebral disc morphology generally maintained with decreased T2 signal compatible with mild desiccation. Mild chronic discogenic endplate changes I2-9. No acute osseous process. No suspicious osseous or disc enhancement. CORD:Patchy bright T2 and STIR signal throughout the spinal cord. No myelomalacia or syrinx. No abnormal cord, leptomeningeal upper dural enhancement. POSTERIOR FOSSA, VERTEBRAL ARTERIES, PARASPINAL TISSUES: No MR findings of ligamentous injury. Vertebral artery flow voids present. Included posterior fossa and paraspinal soft tissues are normal. DISC LEVELS: C2-3: Small broad-based disc bulge, uncovertebral hypertrophy. Mild RIGHT, severe LEFT facet arthropathy similar to prior examination with trace LEFT facet effusion. Mild canal stenosis. Mild LEFT neural foraminal narrowing.  C3-4: Small broad-based central disc protrusion, uncovertebral hypertrophy. Severe RIGHT, moderate LEFT facet arthropathy. Mild canal stenosis. Mild bilateral neural foraminal narrowing. C4-5: 3 mm broad-based RIGHT central disc protrusion. Uncovertebral hypertrophy and mild to moderate facet arthropathy. Moderate canal stenosis. Severe RIGHT, mild LEFT neural foraminal narrowing. C5-6: ACDF. Mild facet arthropathy without canal stenosis. Mild RIGHT neural foraminal narrowing. C6-7: ACDF. No canal stenosis. Uncovertebral hypertrophy without neural foraminal narrowing. C7-T1: No disc bulge, canal stenosis nor neural foraminal narrowing. Mild RIGHT and moderate LEFT facet arthropathy. MRI THORACIC SPINE FINDINGS - multiple sequences are mildly motion degraded. ALIGNMENT: Maintenance of the thoracic kyphosis. No malalignment. VERTEBRAE/DISCS: Vertebral bodies are intact. Intervertebral discs morphology and signal are normal. Multilevel mild chronic discogenic endplate changes. No abnormal bone marrow signal. No abnormal osseous or discal enhancement. CORD: Slightly heterogeneous upper thoracic spinal cord signal though, limited by patient motion. No abnormal cord, leptomeningeal or epidural enhancement. On prior examination there were patchy demyelinating plaques through the level of the conus medullaris which are not evident today, likely obscured by motion. PREVERTEBRAL AND PARASPINAL SOFT TISSUES: Mild epidural lipomatosis slightly effacing the dorsal thecal sac. DISC LEVELS: T1-2 through T5-6: No disc bulge, canal stenosis nor neural foraminal narrowing. T6-7 broad-based small LEFT central disc protrusion without canal stenosis or neural foraminal narrowing. T7-8 through T12-L1: No disc bulge, canal stenosis nor neural foraminal narrowing. IMPRESSION: MRI HEAD: Stable examination: Severe chronic demyelination without new lesions or acute inflammation. No parenchymal brain volume loss for age. MRI CERVICAL SPINE:  Moderately motion degraded examination. Chronic demyelination without myelomalacia. No enhancement to suggest acute inflammation. Status post C5 through C7 ACDF with suspected nonunion C6-7, recommend cervical spine radiographs. 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 Mild canal stenosis C2-3 and C3-4. Multilevel neural foraminal narrowing: Severe on the RIGHT at C4-5. MRI THORACIC SPINE: Suspected chronic demyelination of the upper thoracic spinal cord though, limited assessment due to patient motion. No enhancement to suggest acute inflammation. Early degenerative change of thoracic spine without canal stenosis or neural foraminal narrowing. Electronically Signed   By: Elon Alas M.D.   On: 05/14/2016  21:51   Mr Cervical Spine W Or Wo Contrast  Result Date: 05/14/2016 CLINICAL DATA:  Leg weakness for 5 days. History of multiple sclerosis, endocarditis, cervical spine stenosis, breast cancer, ACDF 2013. EXAM: MRI HEAD WITHOUT AND WITH CONTRAST MRI CERVICAL SPINE WITHOUT AND WITH CONTRAST MRI THORACIC SPINE WITHOUT AND WITH CONTRAST TECHNIQUE: Multiplanar, multiecho pulse sequences of the brain and surrounding structures, and cervical spine, to include the craniocervical junction and thoracic spine, were obtained without and with intravenous contrast. CONTRAST:  84mL MULTIHANCE GADOBENATE DIMEGLUMINE 529 MG/ML IV SOLN COMPARISON:  MRI of the head October 07, 2015, MRI of the cervical spine August 18, 2013 and MRI of the thoracic spine August 27, 2013 FINDINGS: MRI HEAD FINDINGS- mildly motion degraded examination. INTRACRANIAL CONTENTS: No reduced diffusion to suggest acute ischemia. No susceptibility artifact to suggest hemorrhage. Confluent supratentorial white matter FLAIR T2 hyperintensities, predominately radiating from the periventricular margin subcentimeter RIGHT frontal cortical lesion. At least 3 infratentorial white matter lesions. Patchy RIGHT thalamus to posterior  limb of the internal capsular lesion is similar. Lesions demonstrate low T1 signal compatible with black holes of demyelination. Dominant 2.4 cm LEFT periatrial white matter lesion. No enhancing lesions, intracranial masses or mass effect. No parenchymal brain volume loss for age. No abnormal extra-axial fluid collections or abnormal enhancement. VASCULAR: Normal major intracranial vascular flow voids present at skull base. SKULL AND UPPER CERVICAL SPINE: No abnormal sellar expansion. No suspicious calvarial bone marrow signal. Craniocervical junction maintained. SINUSES/ORBITS: The mastoid air-cells and included paranasal sinuses are well-aerated.The included ocular globes and orbital contents are non-suspicious. OTHER: None. MRI CERVICAL SPINE FINDINGS- moderately motion degraded examination. ALIGNMENT: Maintained cervical lordosis.  No malalignment. VERTEBRAE/DISCS: Vertebral bodies are intact. Status post C5-6 and C6-7 ACDF with suspected nonunion at C6-7. Intervertebral disc morphology generally maintained with decreased T2 signal compatible with mild desiccation. Mild chronic discogenic endplate changes I3-3. No acute osseous process. No suspicious osseous or disc enhancement. CORD:Patchy bright T2 and STIR signal throughout the spinal cord. No myelomalacia or syrinx. No abnormal cord, leptomeningeal upper dural enhancement. POSTERIOR FOSSA, VERTEBRAL ARTERIES, PARASPINAL TISSUES: No MR findings of ligamentous injury. Vertebral artery flow voids present. Included posterior fossa and paraspinal soft tissues are normal. DISC LEVELS: C2-3: Small broad-based disc bulge, uncovertebral hypertrophy. Mild RIGHT, severe LEFT facet arthropathy similar to prior examination with trace LEFT facet effusion. Mild canal stenosis. Mild LEFT neural foraminal narrowing. C3-4: Small broad-based central disc protrusion, uncovertebral hypertrophy. Severe RIGHT, moderate LEFT facet arthropathy. Mild canal stenosis. Mild bilateral  neural foraminal narrowing. C4-5: 3 mm broad-based RIGHT central disc protrusion. Uncovertebral hypertrophy and mild to moderate facet arthropathy. Moderate canal stenosis. Severe RIGHT, mild LEFT neural foraminal narrowing. C5-6: ACDF. Mild facet arthropathy without canal stenosis. Mild RIGHT neural foraminal narrowing. C6-7: ACDF. No canal stenosis. Uncovertebral hypertrophy without neural foraminal narrowing. C7-T1: No disc bulge, canal stenosis nor neural foraminal narrowing. Mild RIGHT and moderate LEFT facet arthropathy. MRI THORACIC SPINE FINDINGS - multiple sequences are mildly motion degraded. ALIGNMENT: Maintenance of the thoracic kyphosis. No malalignment. VERTEBRAE/DISCS: Vertebral bodies are intact. Intervertebral discs morphology and signal are normal. Multilevel mild chronic discogenic endplate changes. No abnormal bone marrow signal. No abnormal osseous or discal enhancement. CORD: Slightly heterogeneous upper thoracic spinal cord signal though, limited by patient motion. No abnormal cord, leptomeningeal or epidural enhancement. On prior examination there were patchy demyelinating plaques through the level of the conus medullaris which are not evident today, likely obscured by motion. PREVERTEBRAL AND PARASPINAL SOFT TISSUES: Mild  epidural lipomatosis slightly effacing the dorsal thecal sac. DISC LEVELS: T1-2 through T5-6: No disc bulge, canal stenosis nor neural foraminal narrowing. T6-7 broad-based small LEFT central disc protrusion without canal stenosis or neural foraminal narrowing. T7-8 through T12-L1: No disc bulge, canal stenosis nor neural foraminal narrowing. IMPRESSION: MRI HEAD: Stable examination: Severe chronic demyelination without new lesions or acute inflammation. No parenchymal brain volume loss for age. MRI CERVICAL SPINE: Moderately motion degraded examination. Chronic demyelination without myelomalacia. No enhancement to suggest acute inflammation. Status post C5 through C7 ACDF  with suspected nonunion C6-7, recommend cervical spine radiographs. 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 Mild canal stenosis C2-3 and C3-4. Multilevel neural foraminal narrowing: Severe on the RIGHT at C4-5. MRI THORACIC SPINE: Suspected chronic demyelination of the upper thoracic spinal cord though, limited assessment due to patient motion. No enhancement to suggest acute inflammation. Early degenerative change of thoracic spine without canal stenosis or neural foraminal narrowing. Electronically Signed   By: Elon Alas M.D.   On: 05/14/2016 21:51   Mr Thoracic Spine W Wo Contrast  Result Date: 05/14/2016 CLINICAL DATA:  Leg weakness for 5 days. History of multiple sclerosis, endocarditis, cervical spine stenosis, breast cancer, ACDF 2013. EXAM: MRI HEAD WITHOUT AND WITH CONTRAST MRI CERVICAL SPINE WITHOUT AND WITH CONTRAST MRI THORACIC SPINE WITHOUT AND WITH CONTRAST TECHNIQUE: Multiplanar, multiecho pulse sequences of the brain and surrounding structures, and cervical spine, to include the craniocervical junction and thoracic spine, were obtained without and with intravenous contrast. CONTRAST:  84mL MULTIHANCE GADOBENATE DIMEGLUMINE 529 MG/ML IV SOLN COMPARISON:  MRI of the head October 07, 2015, MRI of the cervical spine August 18, 2013 and MRI of the thoracic spine August 27, 2013 FINDINGS: MRI HEAD FINDINGS- mildly motion degraded examination. INTRACRANIAL CONTENTS: No reduced diffusion to suggest acute ischemia. No susceptibility artifact to suggest hemorrhage. Confluent supratentorial white matter FLAIR T2 hyperintensities, predominately radiating from the periventricular margin subcentimeter RIGHT frontal cortical lesion. At least 3 infratentorial white matter lesions. Patchy RIGHT thalamus to posterior limb of the internal capsular lesion is similar. Lesions demonstrate low T1 signal compatible with black holes of demyelination. Dominant 2.4 cm LEFT periatrial white  matter lesion. No enhancing lesions, intracranial masses or mass effect. No parenchymal brain volume loss for age. No abnormal extra-axial fluid collections or abnormal enhancement. VASCULAR: Normal major intracranial vascular flow voids present at skull base. SKULL AND UPPER CERVICAL SPINE: No abnormal sellar expansion. No suspicious calvarial bone marrow signal. Craniocervical junction maintained. SINUSES/ORBITS: The mastoid air-cells and included paranasal sinuses are well-aerated.The included ocular globes and orbital contents are non-suspicious. OTHER: None. MRI CERVICAL SPINE FINDINGS- moderately motion degraded examination. ALIGNMENT: Maintained cervical lordosis.  No malalignment. VERTEBRAE/DISCS: Vertebral bodies are intact. Status post C5-6 and C6-7 ACDF with suspected nonunion at C6-7. Intervertebral disc morphology generally maintained with decreased T2 signal compatible with mild desiccation. Mild chronic discogenic endplate changes Y6-3. No acute osseous process. No suspicious osseous or disc enhancement. CORD:Patchy bright T2 and STIR signal throughout the spinal cord. No myelomalacia or syrinx. No abnormal cord, leptomeningeal upper dural enhancement. POSTERIOR FOSSA, VERTEBRAL ARTERIES, PARASPINAL TISSUES: No MR findings of ligamentous injury. Vertebral artery flow voids present. Included posterior fossa and paraspinal soft tissues are normal. DISC LEVELS: C2-3: Small broad-based disc bulge, uncovertebral hypertrophy. Mild RIGHT, severe LEFT facet arthropathy similar to prior examination with trace LEFT facet effusion. Mild canal stenosis. Mild LEFT neural foraminal narrowing. C3-4: Small broad-based central disc protrusion, uncovertebral hypertrophy. Severe RIGHT, moderate LEFT  facet arthropathy. Mild canal stenosis. Mild bilateral neural foraminal narrowing. C4-5: 3 mm broad-based RIGHT central disc protrusion. Uncovertebral hypertrophy and mild to moderate facet arthropathy. Moderate canal  stenosis. Severe RIGHT, mild LEFT neural foraminal narrowing. C5-6: ACDF. Mild facet arthropathy without canal stenosis. Mild RIGHT neural foraminal narrowing. C6-7: ACDF. No canal stenosis. Uncovertebral hypertrophy without neural foraminal narrowing. C7-T1: No disc bulge, canal stenosis nor neural foraminal narrowing. Mild RIGHT and moderate LEFT facet arthropathy. MRI THORACIC SPINE FINDINGS - multiple sequences are mildly motion degraded. ALIGNMENT: Maintenance of the thoracic kyphosis. No malalignment. VERTEBRAE/DISCS: Vertebral bodies are intact. Intervertebral discs morphology and signal are normal. Multilevel mild chronic discogenic endplate changes. No abnormal bone marrow signal. No abnormal osseous or discal enhancement. CORD: Slightly heterogeneous upper thoracic spinal cord signal though, limited by patient motion. No abnormal cord, leptomeningeal or epidural enhancement. On prior examination there were patchy demyelinating plaques through the level of the conus medullaris which are not evident today, likely obscured by motion. PREVERTEBRAL AND PARASPINAL SOFT TISSUES: Mild epidural lipomatosis slightly effacing the dorsal thecal sac. DISC LEVELS: T1-2 through T5-6: No disc bulge, canal stenosis nor neural foraminal narrowing. T6-7 broad-based small LEFT central disc protrusion without canal stenosis or neural foraminal narrowing. T7-8 through T12-L1: No disc bulge, canal stenosis nor neural foraminal narrowing. IMPRESSION: MRI HEAD: Stable examination: Severe chronic demyelination without new lesions or acute inflammation. No parenchymal brain volume loss for age. MRI CERVICAL SPINE: Moderately motion degraded examination. Chronic demyelination without myelomalacia. No enhancement to suggest acute inflammation. Status post C5 through C7 ACDF with suspected nonunion C6-7, recommend cervical spine radiographs. 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 Mild canal stenosis C2-3  and C3-4. Multilevel neural foraminal narrowing: Severe on the RIGHT at C4-5. MRI THORACIC SPINE: Suspected chronic demyelination of the upper thoracic spinal cord though, limited assessment due to patient motion. No enhancement to suggest acute inflammation. Early degenerative change of thoracic spine without canal stenosis or neural foraminal narrowing. Electronically Signed   By: Elon Alas M.D.   On: 05/14/2016 21:51     Assessment/Plan: 56 year old female with a history of MS presenting with weakness.  MRI was performed of her brain, cervical and thoracic pine.  These have been reviewed and although these films are consistent with MS there is no suggestion of active disease.  Patient with UTI.  Currently on antibiotics.  Solumedrol started.    Recommendations: 1.  PT/OT consults 2.  Solumedrol 1000mg  IV for 2 more days with GI prophylaxis. 3.  Agree with treatment of UTI.    Alexis Goodell, MD Neurology [phone] 05/15/2016, 11:41 AM

## 2016-05-15 NOTE — Progress Notes (Signed)
A&O patient admitted with MS flare. Pt denies pain. Husband is at bedside. Pt denies pain at present. Admission profile completed, patient oriented to room and call light, education provided concerning admission diagnoses and medications ordered. Pt and husband verbalized understanding. Pt skin intact however pt arrived with BLE compression stockings in place that both the patient and husband state have not been removed in "about 2 months". Upon removing copious amounts of dead skin were found under stockings, skin intact but extremely dry, crusty/flaky. PIV to left [facility] that pt arrived was found to be occluded. Pt has left PAC that was accessed with 78M 3/4 per policy with positive blood return. Pt is a high fall risk, exit alarm is activated and pt verbalized and demonstrated use of the call light and telephone.

## 2016-05-16 DIAGNOSIS — N3281 Overactive bladder: Secondary | ICD-10-CM | POA: Diagnosis present

## 2016-05-16 DIAGNOSIS — Z9221 Personal history of antineoplastic chemotherapy: Secondary | ICD-10-CM | POA: Diagnosis not present

## 2016-05-16 DIAGNOSIS — R531 Weakness: Secondary | ICD-10-CM | POA: Diagnosis not present

## 2016-05-16 DIAGNOSIS — G35 Multiple sclerosis: Secondary | ICD-10-CM | POA: Diagnosis not present

## 2016-05-16 DIAGNOSIS — Z981 Arthrodesis status: Secondary | ICD-10-CM | POA: Diagnosis not present

## 2016-05-16 DIAGNOSIS — G801 Spastic diplegic cerebral palsy: Secondary | ICD-10-CM | POA: Diagnosis present

## 2016-05-16 DIAGNOSIS — G8929 Other chronic pain: Secondary | ICD-10-CM | POA: Diagnosis present

## 2016-05-16 DIAGNOSIS — Z79899 Other long term (current) drug therapy: Secondary | ICD-10-CM | POA: Diagnosis not present

## 2016-05-16 DIAGNOSIS — N390 Urinary tract infection, site not specified: Secondary | ICD-10-CM | POA: Diagnosis present

## 2016-05-16 DIAGNOSIS — Z853 Personal history of malignant neoplasm of breast: Secondary | ICD-10-CM | POA: Diagnosis not present

## 2016-05-16 DIAGNOSIS — I890 Lymphedema, not elsewhere classified: Secondary | ICD-10-CM | POA: Diagnosis present

## 2016-05-16 DIAGNOSIS — N319 Neuromuscular dysfunction of bladder, unspecified: Secondary | ICD-10-CM | POA: Diagnosis present

## 2016-05-16 DIAGNOSIS — Z885 Allergy status to narcotic agent status: Secondary | ICD-10-CM | POA: Diagnosis not present

## 2016-05-16 DIAGNOSIS — Z882 Allergy status to sulfonamides status: Secondary | ICD-10-CM | POA: Diagnosis not present

## 2016-05-16 DIAGNOSIS — G40209 Localization-related (focal) (partial) symptomatic epilepsy and epileptic syndromes with complex partial seizures, not intractable, without status epilepticus: Secondary | ICD-10-CM | POA: Diagnosis present

## 2016-05-16 DIAGNOSIS — R29898 Other symptoms and signs involving the musculoskeletal system: Secondary | ICD-10-CM | POA: Diagnosis not present

## 2016-05-16 LAB — URINE CULTURE
Culture: NO GROWTH
Special Requests: NORMAL

## 2016-05-16 MED ORDER — HOME MED STORE IN PYXIS
1.0000 | Status: DC
  Administered 2016-05-16: 1 via SUBCUTANEOUS
  Filled 2016-05-16: qty 1

## 2016-05-16 NOTE — Care Management (Signed)
Admitted under observation with the diagnosis of multiple sclerosis. Lives with husband, [name], [phone]. Last seen Dr. Sanda Klein 6 months ago. No home health. No skilled facility. No home oxygen. Prescriptions are filled at [facility] on [address] [facility]. Self feed, needs help with baths and dressing. No falls Very good appetite. Multiple Sclerosis diagnosis since December 2001. Transport chair and rolling walker in the home. Husband is in the home 24/7 and helps with her care as needed.  Physical therapy evaluation completed. Recommending Inpatient Acute Rehabilitation. Discussed this plan with Mr & Ms. Lucy. Would like to go to Inpatient Acute Rehabilitation if possible. Discussed that she was under observation and Medicare. Discussed that Karene Fry RN representative for Inpatient Acute Rehab will need to discuss with receiving physicians and she will let us know if they can accept Rebecca [name] into their facility. Shelbie Ammons RN MSN CCM Care Management [phone]

## 2016-05-16 NOTE — Progress Notes (Signed)
[facility] [place] NAME: Rebecca [name]    MR#:  [id number]  DATE OF BIRTH:  [birth year]  SUBJECTIVE:  Patient at baseline not very ambulatory. However over past few days she has had persistent bilateral lower extremity weakness. She denies fall  REVIEW OF SYSTEMS:    Review of Systems  Constitutional: Negative for chills, fever and malaise/fatigue.  HENT: Negative.  Negative for ear discharge, ear pain, hearing loss, nosebleeds and sore throat.   Eyes: Negative.  Negative for blurred vision and pain.  Respiratory: Negative.  Negative for cough, hemoptysis, shortness of breath and wheezing.   Cardiovascular: Negative.  Negative for chest pain, palpitations and leg swelling.  Gastrointestinal: Negative.  Negative for abdominal pain, blood in stool, diarrhea, nausea and vomiting.  Genitourinary: Positive for frequency and urgency. Negative for dysuria.  Musculoskeletal: Negative.  Negative for back pain.  Skin: Negative.   Neurological: Positive for weakness. Negative for dizziness, tremors, speech change, focal weakness, seizures and headaches.       B/l leg weakness   Endo/Heme/Allergies: Negative.  Does not bruise/bleed easily.  Psychiatric/Behavioral: Negative.  Negative for depression, hallucinations and suicidal ideas.    Tolerating Diet: yes      DRUG ALLERGIES:   Allergies  Allergen Reactions  . Fentanyl Nausea And Vomiting and Nausea Only    vomiting Was given in PACU x3 each time patient got sick vomiting Was given in PACU x3 each time patient got sick  . Sulfa Antibiotics Hives and Other (See Comments)    Light headed, over heated    VITALS:  Blood pressure 109/61, pulse 62, temperature 98.3 F (36.8 C), temperature source Oral, resp. rate 18, height 5\' 4"  (1.626 m), weight 79.4 kg (175 lb), SpO2 98 %.  PHYSICAL EXAMINATION:   Physical Exam  Constitutional: She is oriented to person, place, and time and  well-developed, well-nourished, and in no distress. No distress.  HENT:  Head: Normocephalic.  Eyes: No scleral icterus.  Neck: Normal range of motion. Neck supple. No JVD present. No tracheal deviation present.  Cardiovascular: Normal rate, regular rhythm and normal heart sounds.  Exam reveals no gallop and no friction rub.   No murmur heard. Pulmonary/Chest: Effort normal and breath sounds normal. No respiratory distress. She has no wheezes. She has no rales. She exhibits no tenderness.  Abdominal: Soft. Bowel sounds are normal. She exhibits no distension and no mass. There is no tenderness. There is no rebound and no guarding.  Musculoskeletal: She exhibits no edema.  2/5 LE B/L  Neurological: She is alert and oriented to person, place, and time.  Skin: Skin is warm. No rash noted. No erythema.  Psychiatric: Affect and judgment normal.      LABORATORY PANEL:   CBC  Recent Labs Lab 05/15/16 0610  WBC 3.4*  HGB 11.9*  HCT 34.6*  PLT 111*   ------------------------------------------------------------------------------------------------------------------  Chemistries   Recent Labs Lab 05/15/16 0610  NA 138  K 3.6  CL 104  CO2 26  GLUCOSE 119*  BUN 12  CREATININE 0.42*  CALCIUM 9.0   ------------------------------------------------------------------------------------------------------------------  Cardiac Enzymes  Recent Labs Lab 05/14/16 1355  TROPONINI <0.03   ------------------------------------------------------------------------------------------------------------------  RADIOLOGY:  Ct Head Wo Contrast  Result Date: 05/14/2016 CLINICAL DATA:  Patient with history of MS states she has increased weakness x 3 to 4 days. EXAM: CT HEAD WITHOUT CONTRAST TECHNIQUE: Contiguous axial images were obtained from the base of the skull through the vertex without  intravenous contrast. COMPARISON:  CT head dated 02/28/2011 and brain MRI dated 10/07/2015. FINDINGS: Brain:  Mild generalized parenchymal atrophy with commensurate dilatation of the sulci. Ventricles are stable in size and configuration. Patchy areas of low density within the bilateral periventricular and subcortical white matter regions bilaterally, compatible with the white matter changes demonstrated on earlier MRI related to patient's multiple sclerosis. There is no mass, hemorrhage, edema or other evidence of acute parenchymal abnormality. No extra-axial hemorrhage. Vascular: There are chronic calcified atherosclerotic changes of the large vessels at the skull base. No unexpected hyperdense vessel. Skull: Normal. Negative for fracture or focal lesion. Sinuses/Orbits: No acute finding. Other: None. IMPRESSION: 1. No acute findings.  No intracranial mass, hemorrhage or edema. 2. Stable white matter changes, consistent with sequela of patient's multiple sclerosis. Electronically Signed   By: Franki Cabot M.D.   On: 05/14/2016 16:44   Mr Brain W And Wo Contrast  Result Date: 05/14/2016 CLINICAL DATA:  Leg weakness for 5 days. History of multiple sclerosis, endocarditis, cervical spine stenosis, breast cancer, ACDF 2013. EXAM: MRI HEAD WITHOUT AND WITH CONTRAST MRI CERVICAL SPINE WITHOUT AND WITH CONTRAST MRI THORACIC SPINE WITHOUT AND WITH CONTRAST TECHNIQUE: Multiplanar, multiecho pulse sequences of the brain and surrounding structures, and cervical spine, to include the craniocervical junction and thoracic spine, were obtained without and with intravenous contrast. CONTRAST:  62mL MULTIHANCE GADOBENATE DIMEGLUMINE 529 MG/ML IV SOLN COMPARISON:  MRI of the head October 07, 2015, MRI of the cervical spine August 18, 2013 and MRI of the thoracic spine August 27, 2013 FINDINGS: MRI HEAD FINDINGS- mildly motion degraded examination. INTRACRANIAL CONTENTS: No reduced diffusion to suggest acute ischemia. No susceptibility artifact to suggest hemorrhage. Confluent supratentorial white matter FLAIR T2 hyperintensities,  predominately radiating from the periventricular margin subcentimeter RIGHT frontal cortical lesion. At least 3 infratentorial white matter lesions. Patchy RIGHT thalamus to posterior limb of the internal capsular lesion is similar. Lesions demonstrate low T1 signal compatible with black holes of demyelination. Dominant 2.4 cm LEFT periatrial white matter lesion. No enhancing lesions, intracranial masses or mass effect. No parenchymal brain volume loss for age. No abnormal extra-axial fluid collections or abnormal enhancement. VASCULAR: Normal major intracranial vascular flow voids present at skull base. SKULL AND UPPER CERVICAL SPINE: No abnormal sellar expansion. No suspicious calvarial bone marrow signal. Craniocervical junction maintained. SINUSES/ORBITS: The mastoid air-cells and included paranasal sinuses are well-aerated.The included ocular globes and orbital contents are non-suspicious. OTHER: None. MRI CERVICAL SPINE FINDINGS- moderately motion degraded examination. ALIGNMENT: Maintained cervical lordosis.  No malalignment. VERTEBRAE/DISCS: Vertebral bodies are intact. Status post C5-6 and C6-7 ACDF with suspected nonunion at C6-7. Intervertebral disc morphology generally maintained with decreased T2 signal compatible with mild desiccation. Mild chronic discogenic endplate changes B2-0. No acute osseous process. No suspicious osseous or disc enhancement. CORD:Patchy bright T2 and STIR signal throughout the spinal cord. No myelomalacia or syrinx. No abnormal cord, leptomeningeal upper dural enhancement. POSTERIOR FOSSA, VERTEBRAL ARTERIES, PARASPINAL TISSUES: No MR findings of ligamentous injury. Vertebral artery flow voids present. Included posterior fossa and paraspinal soft tissues are normal. DISC LEVELS: C2-3: Small broad-based disc bulge, uncovertebral hypertrophy. Mild RIGHT, severe LEFT facet arthropathy similar to prior examination with trace LEFT facet effusion. Mild canal stenosis. Mild LEFT  neural foraminal narrowing. C3-4: Small broad-based central disc protrusion, uncovertebral hypertrophy. Severe RIGHT, moderate LEFT facet arthropathy. Mild canal stenosis. Mild bilateral neural foraminal narrowing. C4-5: 3 mm broad-based RIGHT central disc protrusion. Uncovertebral hypertrophy and mild to moderate facet arthropathy. Moderate  canal stenosis. Severe RIGHT, mild LEFT neural foraminal narrowing. C5-6: ACDF. Mild facet arthropathy without canal stenosis. Mild RIGHT neural foraminal narrowing. C6-7: ACDF. No canal stenosis. Uncovertebral hypertrophy without neural foraminal narrowing. C7-T1: No disc bulge, canal stenosis nor neural foraminal narrowing. Mild RIGHT and moderate LEFT facet arthropathy. MRI THORACIC SPINE FINDINGS - multiple sequences are mildly motion degraded. ALIGNMENT: Maintenance of the thoracic kyphosis. No malalignment. VERTEBRAE/DISCS: Vertebral bodies are intact. Intervertebral discs morphology and signal are normal. Multilevel mild chronic discogenic endplate changes. No abnormal bone marrow signal. No abnormal osseous or discal enhancement. CORD: Slightly heterogeneous upper thoracic spinal cord signal though, limited by patient motion. No abnormal cord, leptomeningeal or epidural enhancement. On prior examination there were patchy demyelinating plaques through the level of the conus medullaris which are not evident today, likely obscured by motion. PREVERTEBRAL AND PARASPINAL SOFT TISSUES: Mild epidural lipomatosis slightly effacing the dorsal thecal sac. DISC LEVELS: T1-2 through T5-6: No disc bulge, canal stenosis nor neural foraminal narrowing. T6-7 broad-based small LEFT central disc protrusion without canal stenosis or neural foraminal narrowing. T7-8 through T12-L1: No disc bulge, canal stenosis nor neural foraminal narrowing. IMPRESSION: MRI HEAD: Stable examination: Severe chronic demyelination without new lesions or acute inflammation. No parenchymal brain volume loss for  age. MRI CERVICAL SPINE: Moderately motion degraded examination. Chronic demyelination without myelomalacia. No enhancement to suggest acute inflammation. Status post C5 through C7 ACDF with suspected nonunion C6-7, recommend cervical spine radiographs. 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 Mild canal stenosis C2-3 and C3-4. Multilevel neural foraminal narrowing: Severe on the RIGHT at C4-5. MRI THORACIC SPINE: Suspected chronic demyelination of the upper thoracic spinal cord though, limited assessment due to patient motion. No enhancement to suggest acute inflammation. Early degenerative change of thoracic spine without canal stenosis or neural foraminal narrowing. Electronically Signed   By: Elon Alas M.D.   On: 05/14/2016 21:51   Mr Cervical Spine W Or Wo Contrast  Result Date: 05/14/2016 CLINICAL DATA:  Leg weakness for 5 days. History of multiple sclerosis, endocarditis, cervical spine stenosis, breast cancer, ACDF 2013. EXAM: MRI HEAD WITHOUT AND WITH CONTRAST MRI CERVICAL SPINE WITHOUT AND WITH CONTRAST MRI THORACIC SPINE WITHOUT AND WITH CONTRAST TECHNIQUE: Multiplanar, multiecho pulse sequences of the brain and surrounding structures, and cervical spine, to include the craniocervical junction and thoracic spine, were obtained without and with intravenous contrast. CONTRAST:  39mL MULTIHANCE GADOBENATE DIMEGLUMINE 529 MG/ML IV SOLN COMPARISON:  MRI of the head October 07, 2015, MRI of the cervical spine August 18, 2013 and MRI of the thoracic spine August 27, 2013 FINDINGS: MRI HEAD FINDINGS- mildly motion degraded examination. INTRACRANIAL CONTENTS: No reduced diffusion to suggest acute ischemia. No susceptibility artifact to suggest hemorrhage. Confluent supratentorial white matter FLAIR T2 hyperintensities, predominately radiating from the periventricular margin subcentimeter RIGHT frontal cortical lesion. At least 3 infratentorial white matter lesions. Patchy RIGHT  thalamus to posterior limb of the internal capsular lesion is similar. Lesions demonstrate low T1 signal compatible with black holes of demyelination. Dominant 2.4 cm LEFT periatrial white matter lesion. No enhancing lesions, intracranial masses or mass effect. No parenchymal brain volume loss for age. No abnormal extra-axial fluid collections or abnormal enhancement. VASCULAR: Normal major intracranial vascular flow voids present at skull base. SKULL AND UPPER CERVICAL SPINE: No abnormal sellar expansion. No suspicious calvarial bone marrow signal. Craniocervical junction maintained. SINUSES/ORBITS: The mastoid air-cells and included paranasal sinuses are well-aerated.The included ocular globes and orbital contents are non-suspicious. OTHER: None. MRI CERVICAL  SPINE FINDINGS- moderately motion degraded examination. ALIGNMENT: Maintained cervical lordosis.  No malalignment. VERTEBRAE/DISCS: Vertebral bodies are intact. Status post C5-6 and C6-7 ACDF with suspected nonunion at C6-7. Intervertebral disc morphology generally maintained with decreased T2 signal compatible with mild desiccation. Mild chronic discogenic endplate changes K9-3. No acute osseous process. No suspicious osseous or disc enhancement. CORD:Patchy bright T2 and STIR signal throughout the spinal cord. No myelomalacia or syrinx. No abnormal cord, leptomeningeal upper dural enhancement. POSTERIOR FOSSA, VERTEBRAL ARTERIES, PARASPINAL TISSUES: No MR findings of ligamentous injury. Vertebral artery flow voids present. Included posterior fossa and paraspinal soft tissues are normal. DISC LEVELS: C2-3: Small broad-based disc bulge, uncovertebral hypertrophy. Mild RIGHT, severe LEFT facet arthropathy similar to prior examination with trace LEFT facet effusion. Mild canal stenosis. Mild LEFT neural foraminal narrowing. C3-4: Small broad-based central disc protrusion, uncovertebral hypertrophy. Severe RIGHT, moderate LEFT facet arthropathy. Mild canal  stenosis. Mild bilateral neural foraminal narrowing. C4-5: 3 mm broad-based RIGHT central disc protrusion. Uncovertebral hypertrophy and mild to moderate facet arthropathy. Moderate canal stenosis. Severe RIGHT, mild LEFT neural foraminal narrowing. C5-6: ACDF. Mild facet arthropathy without canal stenosis. Mild RIGHT neural foraminal narrowing. C6-7: ACDF. No canal stenosis. Uncovertebral hypertrophy without neural foraminal narrowing. C7-T1: No disc bulge, canal stenosis nor neural foraminal narrowing. Mild RIGHT and moderate LEFT facet arthropathy. MRI THORACIC SPINE FINDINGS - multiple sequences are mildly motion degraded. ALIGNMENT: Maintenance of the thoracic kyphosis. No malalignment. VERTEBRAE/DISCS: Vertebral bodies are intact. Intervertebral discs morphology and signal are normal. Multilevel mild chronic discogenic endplate changes. No abnormal bone marrow signal. No abnormal osseous or discal enhancement. CORD: Slightly heterogeneous upper thoracic spinal cord signal though, limited by patient motion. No abnormal cord, leptomeningeal or epidural enhancement. On prior examination there were patchy demyelinating plaques through the level of the conus medullaris which are not evident today, likely obscured by motion. PREVERTEBRAL AND PARASPINAL SOFT TISSUES: Mild epidural lipomatosis slightly effacing the dorsal thecal sac. DISC LEVELS: T1-2 through T5-6: No disc bulge, canal stenosis nor neural foraminal narrowing. T6-7 broad-based small LEFT central disc protrusion without canal stenosis or neural foraminal narrowing. T7-8 through T12-L1: No disc bulge, canal stenosis nor neural foraminal narrowing. IMPRESSION: MRI HEAD: Stable examination: Severe chronic demyelination without new lesions or acute inflammation. No parenchymal brain volume loss for age. MRI CERVICAL SPINE: Moderately motion degraded examination. Chronic demyelination without myelomalacia. No enhancement to suggest acute inflammation. Status  post C5 through C7 ACDF with suspected nonunion C6-7, recommend cervical spine radiographs. 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 Mild canal stenosis C2-3 and C3-4. Multilevel neural foraminal narrowing: Severe on the RIGHT at C4-5. MRI THORACIC SPINE: Suspected chronic demyelination of the upper thoracic spinal cord though, limited assessment due to patient motion. No enhancement to suggest acute inflammation. Early degenerative change of thoracic spine without canal stenosis or neural foraminal narrowing. Electronically Signed   By: Elon Alas M.D.   On: 05/14/2016 21:51   Mr Thoracic Spine W Wo Contrast  Result Date: 05/14/2016 CLINICAL DATA:  Leg weakness for 5 days. History of multiple sclerosis, endocarditis, cervical spine stenosis, breast cancer, ACDF 2013. EXAM: MRI HEAD WITHOUT AND WITH CONTRAST MRI CERVICAL SPINE WITHOUT AND WITH CONTRAST MRI THORACIC SPINE WITHOUT AND WITH CONTRAST TECHNIQUE: Multiplanar, multiecho pulse sequences of the brain and surrounding structures, and cervical spine, to include the craniocervical junction and thoracic spine, were obtained without and with intravenous contrast. CONTRAST:  62mL MULTIHANCE GADOBENATE DIMEGLUMINE 529 MG/ML IV SOLN COMPARISON:  MRI of the  head October 07, 2015, MRI of the cervical spine August 18, 2013 and MRI of the thoracic spine August 27, 2013 FINDINGS: MRI HEAD FINDINGS- mildly motion degraded examination. INTRACRANIAL CONTENTS: No reduced diffusion to suggest acute ischemia. No susceptibility artifact to suggest hemorrhage. Confluent supratentorial white matter FLAIR T2 hyperintensities, predominately radiating from the periventricular margin subcentimeter RIGHT frontal cortical lesion. At least 3 infratentorial white matter lesions. Patchy RIGHT thalamus to posterior limb of the internal capsular lesion is similar. Lesions demonstrate low T1 signal compatible with black holes of demyelination. Dominant 2.4  cm LEFT periatrial white matter lesion. No enhancing lesions, intracranial masses or mass effect. No parenchymal brain volume loss for age. No abnormal extra-axial fluid collections or abnormal enhancement. VASCULAR: Normal major intracranial vascular flow voids present at skull base. SKULL AND UPPER CERVICAL SPINE: No abnormal sellar expansion. No suspicious calvarial bone marrow signal. Craniocervical junction maintained. SINUSES/ORBITS: The mastoid air-cells and included paranasal sinuses are well-aerated.The included ocular globes and orbital contents are non-suspicious. OTHER: None. MRI CERVICAL SPINE FINDINGS- moderately motion degraded examination. ALIGNMENT: Maintained cervical lordosis.  No malalignment. VERTEBRAE/DISCS: Vertebral bodies are intact. Status post C5-6 and C6-7 ACDF with suspected nonunion at C6-7. Intervertebral disc morphology generally maintained with decreased T2 signal compatible with mild desiccation. Mild chronic discogenic endplate changes C5-8. No acute osseous process. No suspicious osseous or disc enhancement. CORD:Patchy bright T2 and STIR signal throughout the spinal cord. No myelomalacia or syrinx. No abnormal cord, leptomeningeal upper dural enhancement. POSTERIOR FOSSA, VERTEBRAL ARTERIES, PARASPINAL TISSUES: No MR findings of ligamentous injury. Vertebral artery flow voids present. Included posterior fossa and paraspinal soft tissues are normal. DISC LEVELS: C2-3: Small broad-based disc bulge, uncovertebral hypertrophy. Mild RIGHT, severe LEFT facet arthropathy similar to prior examination with trace LEFT facet effusion. Mild canal stenosis. Mild LEFT neural foraminal narrowing. C3-4: Small broad-based central disc protrusion, uncovertebral hypertrophy. Severe RIGHT, moderate LEFT facet arthropathy. Mild canal stenosis. Mild bilateral neural foraminal narrowing. C4-5: 3 mm broad-based RIGHT central disc protrusion. Uncovertebral hypertrophy and mild to moderate facet  arthropathy. Moderate canal stenosis. Severe RIGHT, mild LEFT neural foraminal narrowing. C5-6: ACDF. Mild facet arthropathy without canal stenosis. Mild RIGHT neural foraminal narrowing. C6-7: ACDF. No canal stenosis. Uncovertebral hypertrophy without neural foraminal narrowing. C7-T1: No disc bulge, canal stenosis nor neural foraminal narrowing. Mild RIGHT and moderate LEFT facet arthropathy. MRI THORACIC SPINE FINDINGS - multiple sequences are mildly motion degraded. ALIGNMENT: Maintenance of the thoracic kyphosis. No malalignment. VERTEBRAE/DISCS: Vertebral bodies are intact. Intervertebral discs morphology and signal are normal. Multilevel mild chronic discogenic endplate changes. No abnormal bone marrow signal. No abnormal osseous or discal enhancement. CORD: Slightly heterogeneous upper thoracic spinal cord signal though, limited by patient motion. No abnormal cord, leptomeningeal or epidural enhancement. On prior examination there were patchy demyelinating plaques through the level of the conus medullaris which are not evident today, likely obscured by motion. PREVERTEBRAL AND PARASPINAL SOFT TISSUES: Mild epidural lipomatosis slightly effacing the dorsal thecal sac. DISC LEVELS: T1-2 through T5-6: No disc bulge, canal stenosis nor neural foraminal narrowing. T6-7 broad-based small LEFT central disc protrusion without canal stenosis or neural foraminal narrowing. T7-8 through T12-L1: No disc bulge, canal stenosis nor neural foraminal narrowing. IMPRESSION: MRI HEAD: Stable examination: Severe chronic demyelination without new lesions or acute inflammation. No parenchymal brain volume loss for age. MRI CERVICAL SPINE: Moderately motion degraded examination. Chronic demyelination without myelomalacia. No enhancement to suggest acute inflammation. Status post C5 through C7 ACDF with suspected nonunion C6-7, recommend cervical spine  radiographs. Moderate canal stenosis C4-5 consistent with adjacent segment  disease. Mild canal stenosis C2-3 and C3-4. Multilevel neural foraminal narrowing: Severe on the RIGHT at C4-5. MRI THORACIC SPINE: Suspected chronic demyelination of the upper thoracic spinal cord though, limited assessment due to patient motion. No enhancement to suggest acute inflammation. Early degenerative change of thoracic spine without canal stenosis or neural foraminal narrowing. Electronically Signed   By: Elon Alas M.D.   On: 05/14/2016 21:51     ASSESSMENT AND PLAN:   56 year old female with a history of relapsing and remitting MS who presents with lower extremity weakness.  1. Lower extremity weakness: Patient has had MRI of the brain, cervical and thoracic spine which are consistent with MS but no suggestions of active disease. Urinary tract infection may explain lower extremity weakness in a patient with MS. Patient needs PT and OT consult Appreciate neurology evaluation  Continue Solu-Medrol thousand milligrams today is day 2/3  2. Urinary tract infection: Continue Rocephin and follow up on urine culture  3. MS continue high-dose IV steroids   Management plans discussed with the patient and she is in agreement.  CODE STATUS: FULL  TOTAL TIME TAKING CARE OF THIS PATIENT: 25 minutes.  D/w husband   POSSIBLE D/C tomorrow, DEPENDING ON CLINICAL CONDITION.   Dorin Stooksbury M.D on 05/16/2016 at 7:31 AM  Between 7am to 6pm - Pager - [phone] After 6pm go to [email or web address] - password [email or web address] [facility]  Office  [phone]  CC: Primary care physician; Enid Derry, MD  Note: This dictation was prepared with Dragon dictation along with smaller phrase technology. Any transcriptional errors that result from this process are unintentional.

## 2016-05-16 NOTE — Progress Notes (Signed)
Rehab Admissions Coordinator Note:  Patient was screened by Retta Diones for appropriateness for an Inpatient Acute Rehab Consult.  Noted PT recommending inpatient rehab consult.  I will follow up with case manager to see if patient would like to pursue inpatient rehab here at [facility].  Retta Diones 05/16/2016, 12:25 PM  I can be reached at [phone].

## 2016-05-16 NOTE — Consult Note (Signed)
[place] Nurse wound consult note Reason for Consult: Chronic lymphedema  Wears removable pneumatic compression at home.  Needs compression while here to avoid fluid build up as she did not bring her home compression garment.  Wound type:Chronic lymphedema Pressure Injury POA: N/A Measurement:N/A  No wounds Wound bed:Intact skin  Drainage (amount, consistency, odor) None Skin is very dry and flaky and is cleansed with soap and water to slough as much as possible.  Dressing procedure/placement/frequency:Cleanse bilateral lower legs with soap and water.  Apply zinc layer from below toes to below knee.  Secure with self adherent Coban wrap and tape.  Change weekly on Monday.  [place] team will follow.  Domenic Moras RN BSN [place] Pager [phone]

## 2016-05-16 NOTE — Consult Note (Signed)
Reason for Consult:LE weakness Referring Physician: Sudini  CC: Lower extremity weakness  Pt is improved and close to baseline.    Past Medical History:  Diagnosis Date  . Aortic valve disease    Mild AS / AI - most recent Echo demonstrated tricuspid aortic valve.  . Bacterial endocarditis    History of .  Marland Kitchen Bilateral lower extremity edema    Noncardiac.  Chronic. LE Venous dopplers - negative for DVT.; Echocardiogram January 2016: Normal EF with normal wall motion and valve function. Only grade 1 diastolic dysfunction. EF 60-65%. Mild MR  . Breast cancer ([place]) 12-31-13   Right breast, 12:00, 1.5 cm, T1c,N0 invasive mammary carcinoma, triple negative. --> Rx with Chemo  . Cervical stenosis of spine   . Herpes zoster   . IBS (irritable bowel syndrome)   . Lymphedema    has legs wrapped at [facility]  . Multiple sclerosis ([place]) 2001   Walks from room to room @ home; but Wheelchair when going out.  Marland Kitchen Neuromuscular disorder ([place])    MS  . Seizures ([place])    Takes Keppra  . Syncope and collapse     Past Surgical History:  Procedure Laterality Date  . ANKLE SURGERY     Left  . ANKLE SURGERY    . ANTERIOR CERVICAL DECOMP/DISCECTOMY FUSION  11/17/2011   Procedure: ANTERIOR CERVICAL DECOMPRESSION/DISCECTOMY FUSION 2 LEVELS;  Surgeon: Erline Levine, MD;  Location: [facility];  Service: Neurosurgery;  Laterality: N/A;  Cervical Five-Six Six-Seven Anterior cervical decompression/diskectomy/fusion  . BREAST BIOPSY Right 12-31-13   invasive mammary  . BREAST SURGERY Right 02/03/2014   Right simple mastectomy with sentinel node biopsy.  . CHOLECYSTECTOMY    . COLONOSCOPY  2014  . Lower extremity venous Dopplers  Feb 27, 2013   No LE DVT  . MASTECTOMY    . Port a cath insertion Right 01/19/2010  . PORT-A-CATH REMOVAL     right  . PORT-A-CATH REMOVAL Right 09/03/2013   Procedure: REMOVAL PORT-A-CATH;  Surgeon: Conrad Stanchfield, MD;  Location: [facility];  Service: Vascular;  Laterality: Right;  .  TRANSTHORACIC ECHOCARDIOGRAM  03/2013; 02/2014   a) Normal LV size and function with EF 60-65%.; Cannot exclude bicuspid aortic valve with mild AS and mild AI.; b) Normal EF with normal wall motion and valve function x Mild MR. G2 DD. EF 60-65%. Tricuspid AoV  . UPPER GI ENDOSCOPY  2014    Family History  Problem Relation Age of Onset  . Cancer Father     skin  . Heart disease Father   . Heart attack Father     heart attack in his 62's  . Thyroid disease Sister   . Ovarian cancer Cousin   . Breast cancer Maternal Aunt 60  . Breast cancer Maternal Grandmother 74    Social History:  reports that she has never smoked. She has never used smokeless tobacco. She reports that she does not drink alcohol or use drugs.  Allergies  Allergen Reactions  . Fentanyl Nausea And Vomiting and Nausea Only    vomiting Was given in PACU x3 each time patient got sick vomiting Was given in PACU x3 each time patient got sick  . Sulfa Antibiotics Hives and Other (See Comments)    Light headed, over heated    Medications:  I have reviewed the patient's current medications. Prior to Admission:  Prescriptions Prior to Admission  Medication Sig Dispense Refill Last Dose  . amantadine (SYMMETREL) 100 MG capsule  Take 1 capsule (100 mg total) by mouth 3 (three) times daily. 90 capsule 12 05/14/2016 at am  . baclofen (LIORESAL) 10 MG tablet Take 1-2 tablets (10-20 mg total) by mouth 3 (three) times daily. 150 tablet 12 05/13/2016 at prn  . Cholecalciferol (VITAMIN D3) 2000 UNITS capsule Take 2,000 Units by mouth daily.   05/13/2016 at lunch  . Cranberry 400 MG TABS Take 1 capsule by mouth daily. Also has magnesium, calcium and vit c   05/14/2016 at am  . fentaNYL (DURAGESIC - DOSED MCG/HR) 50 MCG/HR Place 1 patch (50 mcg total) onto the skin every 3 (three) days. 10 patch 0 05/11/2016  . GLUCOSAMINE-CHONDROITIN PO Take 1 tablet by mouth 4 (four) times daily. STRENGTH: 1500-1200    05/13/2016 at am  . ibuprofen  (ADVIL,MOTRIN) 600 MG tablet TAKE 1 TABLET (600 MG TOTAL) BY MOUTH EVERY 6 (SIX) HOURS AS NEEDED. 60 tablet 2 prn at prn  . Interferon Beta-1a (REBIF REBIDOSE) 44 MCG/0.5ML SOAJ Inject 0.5 mLs into the skin 3 (three) times a week. (Patient taking differently: Inject 0.5 mLs into the skin 3 (three) times a week. Monday, Wednesday and Friday) 18 mL 4 05/13/2016 at am  . levETIRAcetam (KEPPRA) 500 MG tablet Take 1 tablet (500 mg total) by mouth 2 (two) times daily. 180 tablet 4 05/14/2016 at am  . lidocaine-prilocaine (EMLA) cream Apply 1 application topically once. Apply to port then cover with saran wrap 1-2 hours before chemotherapy   prn at prn  . loratadine (CLARITIN) 10 MG tablet Take 10 mg by mouth daily.   05/14/2016 at am  . Lutein-Zeaxanthin 25-5 MG CAPS Take 1 capsule by mouth 2 (two) times daily.   05/13/2016 at pm  . Misc Natural Products (LEG VEIN & CIRCULATION) TABS Take 1 tablet by mouth 2 (two) times daily.    05/14/2016 at am  . Multiple Vitamins-Minerals (MULTIVITAMIN PO) Take 1 tablet by mouth daily.   05/13/2016 at lunch  . oxybutynin (DITROPAN-XL) 10 MG 24 hr tablet TAKE 2 TABLETS BY MOUTH DAILY 180 tablet 3 05/14/2016 at am  . Oxycodone HCl 10 MG TABS Take 1 tablet (10 mg total) by mouth every 6 (six) hours as needed. 120 tablet 0 prn at prn  . prochlorperazine (COMPAZINE) 10 MG tablet Take 1 tablet (10 mg total) by mouth every 6 (six) hours as needed. 60 tablet 5 prrn at prn  . Turmeric 500 MG CAPS Take 1 tablet by mouth daily.   05/14/2016 at am   Scheduled: . amantadine  100 mg Oral TID  . baclofen  10 mg Oral TID  . cefTRIAXone (ROCEPHIN) IVPB 1 gram/50 mL D5W  1 g Intravenous Q24H  . enoxaparin (LOVENOX) injection  40 mg Subcutaneous Q24H  . fentaNYL  50 mcg Transdermal Q72H  . home med stored in pyxis  1 each Subcutaneous Once per day on Mon Wed Fri  . levETIRAcetam  500 mg Oral BID  . methylPREDNISolone (SOLU-MEDROL) injection  1,000 mg Intravenous Daily  . multivitamin-lutein  1  capsule Oral BID  . oxybutynin  20 mg Oral Daily     Physical Examination: Blood pressure 125/75, pulse 73, temperature 98.1 F (36.7 C), temperature source Oral, resp. rate 18, height 5\' 4"  (1.626 m), weight 79.4 kg (175 lb), SpO2 98 %.  HEENT-  Normocephalic, no lesions, without obvious abnormality.  Normal external eye and conjunctiva.  Normal TM's bilaterally.  Normal auditory canals and external ears. Normal external nose, mucus membranes and septum.  Normal pharynx. Cardiovascular- S1, S2 normal, pulses palpable throughout   Lungs- chest clear, no wheezing, rales, normal symmetric air entry Abdomen- soft, non-tender; bowel sounds normal; no masses,  no organomegaly Extremities- LE edema Lymph-no adenopathy palpable Musculoskeletal-no joint tenderness, deformity or swelling Skin-dry, scaly lower extremity skin  Neurological Examination   Mental Status: Alert, oriented, thought content appropriate.  Speech fluent without evidence of aphasia.  Able to follow 3 step commands without difficulty. Cranial Nerves: II: Discs flat bilaterally; Visual fields grossly normal, pupils equal, round, reactive to light and accommodation III,IV, VI: ptosis not present, extra-ocular motions intact bilaterally V,VII: decrease in left NLF, facial light touch sensation normal bilaterally VIII: hearing normal bilaterally IX,X: gag reflex present XI: bilateral shoulder shrug XII: midline tongue extension Motor: Right : Upper extremity   5/5    Left:     Upper extremity   5-/5  Lower extremity   4-/5     Lower extremity   4-/5 Increased tone throughout Sensory: Pinprick and light touch intact throughout, bilaterally Deep Tendon Reflexes: 1+ and symmetric with absent AJ's Plantars: Right: mute   Left: mute Cerebellar: Dysmetria with finger-to-nose testing Gait: not tested    Laboratory Studies:   Basic Metabolic Panel:  Recent Labs Lab 05/14/16 1355 05/15/16 0610  NA 142 138  K 4.1 3.6   CL 106 104  CO2 28 26  GLUCOSE 109* 119*  BUN 13 12  CREATININE 0.52 0.42*  CALCIUM 9.6 9.0    Liver Function Tests: No results for input(s): AST, ALT, ALKPHOS, BILITOT, PROT, ALBUMIN in the last 168 hours. No results for input(s): LIPASE, AMYLASE in the last 168 hours. No results for input(s): AMMONIA in the last 168 hours.  CBC:  Recent Labs Lab 05/14/16 1355 05/15/16 0610  WBC 3.6 3.4*  HGB 12.9 11.9*  HCT 37.9 34.6*  MCV 90.3 89.9  PLT 127* 111*    Cardiac Enzymes:  Recent Labs Lab 05/14/16 1355  TROPONINI <0.03    BNP: Invalid input(s): POCBNP  CBG: No results for input(s): GLUCAP in the last 168 hours.  Microbiology: Results for orders placed or performed during the hospital encounter of 05/14/16  Urine culture     Status: None   Collection Time: 05/14/16  3:57 PM  Result Value Ref Range Status   Specimen Description URINE, CATHETERIZED  Final   Special Requests Normal  Final   Culture   Final    NO GROWTH Performed at [facility], [address] [place], [place] [address]    Report Status 05/16/2016 FINAL  Final    Coagulation Studies: No results for input(s): LABPROT, INR in the last 72 hours.  Urinalysis:   Recent Labs Lab 05/14/16 1557  COLORURINE AMBER*  LABSPEC 1.027  PHURINE 5.0  GLUCOSEU NEGATIVE  HGBUR NEGATIVE  BILIRUBINUR NEGATIVE  KETONESUR 20*  PROTEINUR 30*  NITRITE NEGATIVE  LEUKOCYTESUR TRACE*    Lipid Panel:     Component Value Date/Time   CHOL 214 (H) 11/25/2015 1216   TRIG 109 11/25/2015 1216   HDL 58 11/25/2015 1216   CHOLHDL 3.7 11/25/2015 1216   VLDL 22 11/25/2015 1216   LDLCALC 134 (H) 11/25/2015 1216    HgbA1C: No results found for: HGBA1C  Urine Drug Screen:  No results found for: LABOPIA, COCAINSCRNUR, LABBENZ, AMPHETMU, THCU, LABBARB  Alcohol Level: No results for input(s): ETH in the last 168 hours.  Other results: EKG: sinus rhythm at 72 bpm.  Imaging: Ct Head Wo  Contrast  Result Date: 05/14/2016 CLINICAL DATA:  Patient with history of MS states she has increased weakness x 3 to 4 days. EXAM: CT HEAD WITHOUT CONTRAST TECHNIQUE: Contiguous axial images were obtained from the base of the skull through the vertex without intravenous contrast. COMPARISON:  CT head dated 02/28/2011 and brain MRI dated 10/07/2015. FINDINGS: Brain: Mild generalized parenchymal atrophy with commensurate dilatation of the sulci. Ventricles are stable in size and configuration. Patchy areas of low density within the bilateral periventricular and subcortical white matter regions bilaterally, compatible with the white matter changes demonstrated on earlier MRI related to patient's multiple sclerosis. There is no mass, hemorrhage, edema or other evidence of acute parenchymal abnormality. No extra-axial hemorrhage. Vascular: There are chronic calcified atherosclerotic changes of the large vessels at the skull base. No unexpected hyperdense vessel. Skull: Normal. Negative for fracture or focal lesion. Sinuses/Orbits: No acute finding. Other: None. IMPRESSION: 1. No acute findings.  No intracranial mass, hemorrhage or edema. 2. Stable white matter changes, consistent with sequela of patient's multiple sclerosis. Electronically Signed   By: Franki Cabot M.D.   On: 05/14/2016 16:44   Mr Brain W And Wo Contrast  Result Date: 05/14/2016 CLINICAL DATA:  Leg weakness for 5 days. History of multiple sclerosis, endocarditis, cervical spine stenosis, breast cancer, ACDF 2013. EXAM: MRI HEAD WITHOUT AND WITH CONTRAST MRI CERVICAL SPINE WITHOUT AND WITH CONTRAST MRI THORACIC SPINE WITHOUT AND WITH CONTRAST TECHNIQUE: Multiplanar, multiecho pulse sequences of the brain and surrounding structures, and cervical spine, to include the craniocervical junction and thoracic spine, were obtained without and with intravenous contrast. CONTRAST:  50mL MULTIHANCE GADOBENATE DIMEGLUMINE 529 MG/ML IV SOLN COMPARISON:  MRI of  the head October 07, 2015, MRI of the cervical spine August 18, 2013 and MRI of the thoracic spine August 27, 2013 FINDINGS: MRI HEAD FINDINGS- mildly motion degraded examination. INTRACRANIAL CONTENTS: No reduced diffusion to suggest acute ischemia. No susceptibility artifact to suggest hemorrhage. Confluent supratentorial white matter FLAIR T2 hyperintensities, predominately radiating from the periventricular margin subcentimeter RIGHT frontal cortical lesion. At least 3 infratentorial white matter lesions. Patchy RIGHT thalamus to posterior limb of the internal capsular lesion is similar. Lesions demonstrate low T1 signal compatible with black holes of demyelination. Dominant 2.4 cm LEFT periatrial white matter lesion. No enhancing lesions, intracranial masses or mass effect. No parenchymal brain volume loss for age. No abnormal extra-axial fluid collections or abnormal enhancement. VASCULAR: Normal major intracranial vascular flow voids present at skull base. SKULL AND UPPER CERVICAL SPINE: No abnormal sellar expansion. No suspicious calvarial bone marrow signal. Craniocervical junction maintained. SINUSES/ORBITS: The mastoid air-cells and included paranasal sinuses are well-aerated.The included ocular globes and orbital contents are non-suspicious. OTHER: None. MRI CERVICAL SPINE FINDINGS- moderately motion degraded examination. ALIGNMENT: Maintained cervical lordosis.  No malalignment. VERTEBRAE/DISCS: Vertebral bodies are intact. Status post C5-6 and C6-7 ACDF with suspected nonunion at C6-7. Intervertebral disc morphology generally maintained with decreased T2 signal compatible with mild desiccation. Mild chronic discogenic endplate changes M8-4. No acute osseous process. No suspicious osseous or disc enhancement. CORD:Patchy bright T2 and STIR signal throughout the spinal cord. No myelomalacia or syrinx. No abnormal cord, leptomeningeal upper dural enhancement. POSTERIOR FOSSA, VERTEBRAL ARTERIES, PARASPINAL  TISSUES: No MR findings of ligamentous injury. Vertebral artery flow voids present. Included posterior fossa and paraspinal soft tissues are normal. DISC LEVELS: C2-3: Small broad-based disc bulge, uncovertebral hypertrophy. Mild RIGHT, severe LEFT facet arthropathy similar to prior examination with trace LEFT facet effusion. Mild canal stenosis. Mild  LEFT neural foraminal narrowing. C3-4: Small broad-based central disc protrusion, uncovertebral hypertrophy. Severe RIGHT, moderate LEFT facet arthropathy. Mild canal stenosis. Mild bilateral neural foraminal narrowing. C4-5: 3 mm broad-based RIGHT central disc protrusion. Uncovertebral hypertrophy and mild to moderate facet arthropathy. Moderate canal stenosis. Severe RIGHT, mild LEFT neural foraminal narrowing. C5-6: ACDF. Mild facet arthropathy without canal stenosis. Mild RIGHT neural foraminal narrowing. C6-7: ACDF. No canal stenosis. Uncovertebral hypertrophy without neural foraminal narrowing. C7-T1: No disc bulge, canal stenosis nor neural foraminal narrowing. Mild RIGHT and moderate LEFT facet arthropathy. MRI THORACIC SPINE FINDINGS - multiple sequences are mildly motion degraded. ALIGNMENT: Maintenance of the thoracic kyphosis. No malalignment. VERTEBRAE/DISCS: Vertebral bodies are intact. Intervertebral discs morphology and signal are normal. Multilevel mild chronic discogenic endplate changes. No abnormal bone marrow signal. No abnormal osseous or discal enhancement. CORD: Slightly heterogeneous upper thoracic spinal cord signal though, limited by patient motion. No abnormal cord, leptomeningeal or epidural enhancement. On prior examination there were patchy demyelinating plaques through the level of the conus medullaris which are not evident today, likely obscured by motion. PREVERTEBRAL AND PARASPINAL SOFT TISSUES: Mild epidural lipomatosis slightly effacing the dorsal thecal sac. DISC LEVELS: T1-2 through T5-6: No disc bulge, canal stenosis nor neural  foraminal narrowing. T6-7 broad-based small LEFT central disc protrusion without canal stenosis or neural foraminal narrowing. T7-8 through T12-L1: No disc bulge, canal stenosis nor neural foraminal narrowing. IMPRESSION: MRI HEAD: Stable examination: Severe chronic demyelination without new lesions or acute inflammation. No parenchymal brain volume loss for age. MRI CERVICAL SPINE: Moderately motion degraded examination. Chronic demyelination without myelomalacia. No enhancement to suggest acute inflammation. Status post C5 through C7 ACDF with suspected nonunion C6-7, recommend cervical spine radiographs. 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 Mild canal stenosis C2-3 and C3-4. Multilevel neural foraminal narrowing: Severe on the RIGHT at C4-5. MRI THORACIC SPINE: Suspected chronic demyelination of the upper thoracic spinal cord though, limited assessment due to patient motion. No enhancement to suggest acute inflammation. Early degenerative change of thoracic spine without canal stenosis or neural foraminal narrowing. Electronically Signed   By: Elon Alas M.D.   On: 05/14/2016 21:51   Mr Cervical Spine W Or Wo Contrast  Result Date: 05/14/2016 CLINICAL DATA:  Leg weakness for 5 days. History of multiple sclerosis, endocarditis, cervical spine stenosis, breast cancer, ACDF 2013. EXAM: MRI HEAD WITHOUT AND WITH CONTRAST MRI CERVICAL SPINE WITHOUT AND WITH CONTRAST MRI THORACIC SPINE WITHOUT AND WITH CONTRAST TECHNIQUE: Multiplanar, multiecho pulse sequences of the brain and surrounding structures, and cervical spine, to include the craniocervical junction and thoracic spine, were obtained without and with intravenous contrast. CONTRAST:  72mL MULTIHANCE GADOBENATE DIMEGLUMINE 529 MG/ML IV SOLN COMPARISON:  MRI of the head October 07, 2015, MRI of the cervical spine August 18, 2013 and MRI of the thoracic spine August 27, 2013 FINDINGS: MRI HEAD FINDINGS- mildly motion degraded  examination. INTRACRANIAL CONTENTS: No reduced diffusion to suggest acute ischemia. No susceptibility artifact to suggest hemorrhage. Confluent supratentorial white matter FLAIR T2 hyperintensities, predominately radiating from the periventricular margin subcentimeter RIGHT frontal cortical lesion. At least 3 infratentorial white matter lesions. Patchy RIGHT thalamus to posterior limb of the internal capsular lesion is similar. Lesions demonstrate low T1 signal compatible with black holes of demyelination. Dominant 2.4 cm LEFT periatrial white matter lesion. No enhancing lesions, intracranial masses or mass effect. No parenchymal brain volume loss for age. No abnormal extra-axial fluid collections or abnormal enhancement. VASCULAR: Normal major intracranial vascular flow voids present  at skull base. SKULL AND UPPER CERVICAL SPINE: No abnormal sellar expansion. No suspicious calvarial bone marrow signal. Craniocervical junction maintained. SINUSES/ORBITS: The mastoid air-cells and included paranasal sinuses are well-aerated.The included ocular globes and orbital contents are non-suspicious. OTHER: None. MRI CERVICAL SPINE FINDINGS- moderately motion degraded examination. ALIGNMENT: Maintained cervical lordosis.  No malalignment. VERTEBRAE/DISCS: Vertebral bodies are intact. Status post C5-6 and C6-7 ACDF with suspected nonunion at C6-7. Intervertebral disc morphology generally maintained with decreased T2 signal compatible with mild desiccation. Mild chronic discogenic endplate changes Z6-1. No acute osseous process. No suspicious osseous or disc enhancement. CORD:Patchy bright T2 and STIR signal throughout the spinal cord. No myelomalacia or syrinx. No abnormal cord, leptomeningeal upper dural enhancement. POSTERIOR FOSSA, VERTEBRAL ARTERIES, PARASPINAL TISSUES: No MR findings of ligamentous injury. Vertebral artery flow voids present. Included posterior fossa and paraspinal soft tissues are normal. DISC LEVELS:  C2-3: Small broad-based disc bulge, uncovertebral hypertrophy. Mild RIGHT, severe LEFT facet arthropathy similar to prior examination with trace LEFT facet effusion. Mild canal stenosis. Mild LEFT neural foraminal narrowing. C3-4: Small broad-based central disc protrusion, uncovertebral hypertrophy. Severe RIGHT, moderate LEFT facet arthropathy. Mild canal stenosis. Mild bilateral neural foraminal narrowing. C4-5: 3 mm broad-based RIGHT central disc protrusion. Uncovertebral hypertrophy and mild to moderate facet arthropathy. Moderate canal stenosis. Severe RIGHT, mild LEFT neural foraminal narrowing. C5-6: ACDF. Mild facet arthropathy without canal stenosis. Mild RIGHT neural foraminal narrowing. C6-7: ACDF. No canal stenosis. Uncovertebral hypertrophy without neural foraminal narrowing. C7-T1: No disc bulge, canal stenosis nor neural foraminal narrowing. Mild RIGHT and moderate LEFT facet arthropathy. MRI THORACIC SPINE FINDINGS - multiple sequences are mildly motion degraded. ALIGNMENT: Maintenance of the thoracic kyphosis. No malalignment. VERTEBRAE/DISCS: Vertebral bodies are intact. Intervertebral discs morphology and signal are normal. Multilevel mild chronic discogenic endplate changes. No abnormal bone marrow signal. No abnormal osseous or discal enhancement. CORD: Slightly heterogeneous upper thoracic spinal cord signal though, limited by patient motion. No abnormal cord, leptomeningeal or epidural enhancement. On prior examination there were patchy demyelinating plaques through the level of the conus medullaris which are not evident today, likely obscured by motion. PREVERTEBRAL AND PARASPINAL SOFT TISSUES: Mild epidural lipomatosis slightly effacing the dorsal thecal sac. DISC LEVELS: T1-2 through T5-6: No disc bulge, canal stenosis nor neural foraminal narrowing. T6-7 broad-based small LEFT central disc protrusion without canal stenosis or neural foraminal narrowing. T7-8 through T12-L1: No disc bulge,  canal stenosis nor neural foraminal narrowing. IMPRESSION: MRI HEAD: Stable examination: Severe chronic demyelination without new lesions or acute inflammation. No parenchymal brain volume loss for age. MRI CERVICAL SPINE: Moderately motion degraded examination. Chronic demyelination without myelomalacia. No enhancement to suggest acute inflammation. Status post C5 through C7 ACDF with suspected nonunion C6-7, recommend cervical spine radiographs. 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 Mild canal stenosis C2-3 and C3-4. Multilevel neural foraminal narrowing: Severe on the RIGHT at C4-5. MRI THORACIC SPINE: Suspected chronic demyelination of the upper thoracic spinal cord though, limited assessment due to patient motion. No enhancement to suggest acute inflammation. Early degenerative change of thoracic spine without canal stenosis or neural foraminal narrowing. Electronically Signed   By: Elon Alas M.D.   On: 05/14/2016 21:51   Mr Thoracic Spine W Wo Contrast  Result Date: 05/14/2016 CLINICAL DATA:  Leg weakness for 5 days. History of multiple sclerosis, endocarditis, cervical spine stenosis, breast cancer, ACDF 2013. EXAM: MRI HEAD WITHOUT AND WITH CONTRAST MRI CERVICAL SPINE WITHOUT AND WITH CONTRAST MRI THORACIC SPINE WITHOUT AND WITH CONTRAST TECHNIQUE:  Multiplanar, multiecho pulse sequences of the brain and surrounding structures, and cervical spine, to include the craniocervical junction and thoracic spine, were obtained without and with intravenous contrast. CONTRAST:  55mL MULTIHANCE GADOBENATE DIMEGLUMINE 529 MG/ML IV SOLN COMPARISON:  MRI of the head October 07, 2015, MRI of the cervical spine August 18, 2013 and MRI of the thoracic spine August 27, 2013 FINDINGS: MRI HEAD FINDINGS- mildly motion degraded examination. INTRACRANIAL CONTENTS: No reduced diffusion to suggest acute ischemia. No susceptibility artifact to suggest hemorrhage. Confluent supratentorial white  matter FLAIR T2 hyperintensities, predominately radiating from the periventricular margin subcentimeter RIGHT frontal cortical lesion. At least 3 infratentorial white matter lesions. Patchy RIGHT thalamus to posterior limb of the internal capsular lesion is similar. Lesions demonstrate low T1 signal compatible with black holes of demyelination. Dominant 2.4 cm LEFT periatrial white matter lesion. No enhancing lesions, intracranial masses or mass effect. No parenchymal brain volume loss for age. No abnormal extra-axial fluid collections or abnormal enhancement. VASCULAR: Normal major intracranial vascular flow voids present at skull base. SKULL AND UPPER CERVICAL SPINE: No abnormal sellar expansion. No suspicious calvarial bone marrow signal. Craniocervical junction maintained. SINUSES/ORBITS: The mastoid air-cells and included paranasal sinuses are well-aerated.The included ocular globes and orbital contents are non-suspicious. OTHER: None. MRI CERVICAL SPINE FINDINGS- moderately motion degraded examination. ALIGNMENT: Maintained cervical lordosis.  No malalignment. VERTEBRAE/DISCS: Vertebral bodies are intact. Status post C5-6 and C6-7 ACDF with suspected nonunion at C6-7. Intervertebral disc morphology generally maintained with decreased T2 signal compatible with mild desiccation. Mild chronic discogenic endplate changes I9-5. No acute osseous process. No suspicious osseous or disc enhancement. CORD:Patchy bright T2 and STIR signal throughout the spinal cord. No myelomalacia or syrinx. No abnormal cord, leptomeningeal upper dural enhancement. POSTERIOR FOSSA, VERTEBRAL ARTERIES, PARASPINAL TISSUES: No MR findings of ligamentous injury. Vertebral artery flow voids present. Included posterior fossa and paraspinal soft tissues are normal. DISC LEVELS: C2-3: Small broad-based disc bulge, uncovertebral hypertrophy. Mild RIGHT, severe LEFT facet arthropathy similar to prior examination with trace LEFT facet effusion.  Mild canal stenosis. Mild LEFT neural foraminal narrowing. C3-4: Small broad-based central disc protrusion, uncovertebral hypertrophy. Severe RIGHT, moderate LEFT facet arthropathy. Mild canal stenosis. Mild bilateral neural foraminal narrowing. C4-5: 3 mm broad-based RIGHT central disc protrusion. Uncovertebral hypertrophy and mild to moderate facet arthropathy. Moderate canal stenosis. Severe RIGHT, mild LEFT neural foraminal narrowing. C5-6: ACDF. Mild facet arthropathy without canal stenosis. Mild RIGHT neural foraminal narrowing. C6-7: ACDF. No canal stenosis. Uncovertebral hypertrophy without neural foraminal narrowing. C7-T1: No disc bulge, canal stenosis nor neural foraminal narrowing. Mild RIGHT and moderate LEFT facet arthropathy. MRI THORACIC SPINE FINDINGS - multiple sequences are mildly motion degraded. ALIGNMENT: Maintenance of the thoracic kyphosis. No malalignment. VERTEBRAE/DISCS: Vertebral bodies are intact. Intervertebral discs morphology and signal are normal. Multilevel mild chronic discogenic endplate changes. No abnormal bone marrow signal. No abnormal osseous or discal enhancement. CORD: Slightly heterogeneous upper thoracic spinal cord signal though, limited by patient motion. No abnormal cord, leptomeningeal or epidural enhancement. On prior examination there were patchy demyelinating plaques through the level of the conus medullaris which are not evident today, likely obscured by motion. PREVERTEBRAL AND PARASPINAL SOFT TISSUES: Mild epidural lipomatosis slightly effacing the dorsal thecal sac. DISC LEVELS: T1-2 through T5-6: No disc bulge, canal stenosis nor neural foraminal narrowing. T6-7 broad-based small LEFT central disc protrusion without canal stenosis or neural foraminal narrowing. T7-8 through T12-L1: No disc bulge, canal stenosis nor neural foraminal narrowing. IMPRESSION: MRI HEAD: Stable examination: Severe chronic demyelination  without new lesions or acute inflammation. No  parenchymal brain volume loss for age. MRI CERVICAL SPINE: Moderately motion degraded examination. Chronic demyelination without myelomalacia. No enhancement to suggest acute inflammation. Status post C5 through C7 ACDF with suspected nonunion C6-7, recommend cervical spine radiographs. 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 Mild canal stenosis C2-3 and C3-4. Multilevel neural foraminal narrowing: Severe on the RIGHT at C4-5. MRI THORACIC SPINE: Suspected chronic demyelination of the upper thoracic spinal cord though, limited assessment due to patient motion. No enhancement to suggest acute inflammation. Early degenerative change of thoracic spine without canal stenosis or neural foraminal narrowing. Electronically Signed   By: Elon Alas M.D.   On: 05/14/2016 21:51     Assessment/Plan: 56 year old female with a history of MS presenting with weakness.  MRI was performed of her brain, cervical and thoracic pine.  These have been reviewed and although these films are consistent with MS there is no suggestion of active disease.  Patient with UTI.  Currently on antibiotics.  Solumedrol started.     -  I am not a big fan of solumedrol when being treated for UTI - Finish 3 day course and likely d/c tomorrow - MRI no lesions - f/up neurology as out pt.   05/16/2016, 12:26 PM

## 2016-05-16 NOTE — Evaluation (Signed)
Physical Therapy Evaluation Patient Details Name: Rebecca [name] MRN: [id number] DOB: Oct 15, 1960 Today's Date: 05/16/2016   History of Present Illness  Pt is a 56 yo female w/ a current history of MS, presented to [facility] w/ bilat LE weakness and a UTI. PMH includes; MS, Breast Cancer, Bilateral LE swelling, seizures, Aortic valve disease, bacterial endocarditis, cervical stenosis, herpes zoster, IBS, and lymphedema    Clinical Impression  Pt A&Ox4 and willing to participate in PT evaluation this morning. She demonstrated good participation and effort throughout the PT session. At baseline pt is ambulatory within the home using a RW, she lives w/ her husband who has been assisting the patient w/ her ADLs.  During PT eval, pt's UE strength was WFLs, she presents w/ decreased strength throughout bilat LE's (3/5) and requires mod assistance for bed mobility and transfers. Sensation is intact throughout all extremities. Pt able to maintain sitting balance but tends to lean to the Left and momentarily corrects to midline w/ cuing. She was able to transfer to standing but requires mod assist +2 and use of a RW for safety. Pt is unsteady in stance and is a high fall risk, she requires use of a RW and min assist +2 to maintain standing posture. She ambulated 2' to the chair but takes small shuffling steps w/ noteable R knee hyperextension due to a R plantar flexor contracture. Overall the patient is severely limited in safe functional mobility due to recent onset of Bilat LE's weakness, and unsteadiness in standing. She will benefit from skilled PT to correct deficits and return to her PLOF; recommend pt transition to CIR when medically appropriate.     Follow Up Recommendations CIR    Equipment Recommendations       Recommendations for Other Services       Precautions / Restrictions Precautions Precautions: Fall Restrictions Weight Bearing Restrictions: No      Mobility  Bed Mobility Overal bed  mobility: Needs Assistance Bed Mobility: Supine to Sit     Supine to sit: Mod assist     General bed mobility comments: pt requires mod assistance to advance bilat LE and assist w/ lifting trunk to sitting postition  Transfers Overall transfer level: Needs assistance   Transfers: Sit to/from Stand Sit to Stand: Mod assist;+2 physical assistance         General transfer comment: pt requires mod assist to guard/block bilat knees and cuing to promote reaching forward leaning when transferring, heavily relies on UE's for push off and requires cuing to stand up tall  Ambulation/Gait Ambulation/Gait assistance: Mod assist;+2 physical assistance Ambulation Distance (Feet): 2 Feet Assistive device: Rolling walker (2 wheeled) Gait Pattern/deviations: Step-to pattern;Decreased step length - right;Decreased step length - left;Shuffle   Gait velocity interpretation: <1.8 ft/sec, indicative of risk for recurrent falls General Gait Details: pt hyperextends R knee in stance phase due to decreased DF, takes small shuffling steps, requires +2 assistance to maintain balance and promote weight shifting and proximal control w/ ambulation   Stairs            Wheelchair Mobility    Modified Rankin (Stroke Patients Only)       Balance Overall balance assessment: Needs assistance Sitting-balance support: Bilateral upper extremity supported;Feet supported Sitting balance-Leahy Scale: Fair Sitting balance - Comments: pt leans to Left in sitting and requires frequent cuing to sit in midline, requires UE support to maintain sitting at EOB Postural control: Posterior lean;Left lateral lean Standing balance support: Bilateral upper extremity supported  Standing balance-Leahy Scale: Poor Standing balance comment: requires use of RW and min to mod assist +2 to maintain standing posture, hips are flexed forward w/ decreased R DF of the ankle and noteable R knee hyperextension                              Pertinent Vitals/Pain Pain Assessment: No/denies pain    Home Living Family/patient expects to be discharged to:: Private residence Living Arrangements: Spouse/significant other Available Help at Discharge: Family;Available 24 hours/day Type of Home: House Home Access: Ramped entrance     Home Layout: One level Home Equipment: Walker - 2 wheels;Wheelchair - manual;Bedside commode      Prior Function Level of Independence: Needs assistance   Gait / Transfers Assistance Needed: pt transfers and ambulates within the home using a RW, cannot transfer to toilet and uses a BSC, uses a wc for transporting outside of the home  ADL's / Homemaking Assistance Needed: pt requires assistance w/ ADLs of bathing and dressing  Comments: Husband normally at home and able to assist patient     Hand Dominance        Extremity/Trunk Assessment   Upper Extremity Assessment Upper Extremity Assessment: Overall WFL for tasks assessed (grossly at least 4+/5 for elbow and shoulder motions)    Lower Extremity Assessment Lower Extremity Assessment: Generalized weakness;RLE deficits/detail;LLE deficits/detail (strength grossly 3/5 throughout, sensation entact in all extremities) RLE Deficits / Details: plantar flexor contracture on the R w/ noted R knee hyperextension in standing LLE Deficits / Details: decreased DF ROM unable to achieve neutral,       Communication   Communication: No difficulties  Cognition Arousal/Alertness: Awake/alert Behavior During Therapy: WFL for tasks assessed/performed Overall Cognitive Status: Within Functional Limits for tasks assessed                                        General Comments      Exercises Other Exercises Other Exercises: sitting reaching acivities to promote forward leaning and liftoff for transfers, 1x12 w/ min assist +2 and cuing to reach forward and laterally to engage LE w/ bed elevated   Assessment/Plan     PT Assessment Patient needs continued PT services  PT Problem List Decreased strength;Decreased range of motion;Decreased activity tolerance;Decreased balance;Decreased mobility;Decreased knowledge of use of DME       PT Treatment Interventions DME instruction;Gait training;Functional mobility training;Balance training;Therapeutic exercise;Therapeutic activities;Neuromuscular re-education;Patient/family education    PT Goals (Current goals can be found in the Care Plan section)  Acute Rehab PT Goals Patient Stated Goal: to get better PT Goal Formulation: With patient Time For Goal Achievement: 05/30/16 Potential to Achieve Goals: Good    Frequency 7X/week   Barriers to discharge Inaccessible home environment;Decreased caregiver support      Co-evaluation               End of Session Equipment Utilized During Treatment: Gait belt Activity Tolerance: Patient tolerated treatment well Patient left: in chair;with call bell/phone within reach;with chair alarm set Nurse Communication: Mobility status PT Visit Diagnosis: Unsteadiness on feet (R26.81);Muscle weakness (generalized) (M62.81);Difficulty in walking, not elsewhere classified (R26.2)    Time: 1032-1100 PT Time Calculation (min) (ACUTE ONLY): 28 min   Charges:         PT G Codes:        Caileigh Canche  Latonya Knight Student PT 05/16/16, 11:35 AM [phone]   Orlene Och 05/16/2016, 11:23 AM

## 2016-05-16 NOTE — Evaluation (Signed)
Occupational Therapy Evaluation Patient Details Name: Rebecca [name] MRN: [id number] DOB: December 12, 1960 Today's Date: 05/16/2016    History of Present Illness Pt is a 55 yo female w/ a current history of MS, presented to [facility] w/ bilat LE weakness and a UTI. PMH includes; MS, Breast Cancer, Bilateral LE swelling, seizures, Aortic valve disease, bacterial endocarditis, cervical stenosis, herpes zoster, IBS, and lymphedema   Clinical Impression   Pt seen for OT evaluation this date. Prior to admission, pt was ambulatory short household distances using RW, requiring transport chair for out of home, and requiring assistance from spouse for bathing and LB dressing tasks, and spouse is responsible for home mgt and meal prep activities. Pt up in recliner after PT session, reports fatigue, abbreviated session due to need for RN to start IV access. Pt presents with decreased strength, activity tolerance, increased risk of falls and increased need for assist with self care tasks. Pt will benefit from skilled OT services to address noted impairments and functional deficits, including education/training in AE/DME/compensatory strategies for self care tasks, energy conservation strategies, bladder/incontinence mgt strategies, and home/routines modifications to maximize safety and minimize falls risk. Pt would benefit from consideration for in-patient rehab.     Follow Up Recommendations  CIR    Equipment Recommendations  None recommended by OT    Recommendations for Other Services       Precautions / Restrictions Precautions Precautions: Fall Restrictions Weight Bearing Restrictions: No      Mobility Bed Mobility     General bed mobility comments: did not assess, pt up in recliner for session  Transfers          General transfer comment: deferred due to RN need to start IV    Balance                            ADL either performed or assessed with clinical judgement   ADL  Overall ADL's : Needs assistance/impaired Eating/Feeding: Set up;Sitting   Grooming: Set up;Sitting   Upper Body Bathing: Minimal assistance;Bed level   Lower Body Bathing: Maximal assistance;Bed level   Upper Body Dressing : Set up;Sitting   Lower Body Dressing: Maximal assistance;Sitting/lateral leans               Functional mobility during ADLs: Moderate assistance;+2 for physical assistance;Rolling walker General ADL Comments: generally Max A for LB ADL     Vision Baseline Vision/History: Wears glasses Wears Glasses: At all times Patient Visual Report: No change from baseline Vision Assessment?: No apparent visual deficits     Perception     Praxis Praxis Praxis tested?: Within functional limits    Pertinent Vitals/Pain Pain Assessment: No/denies pain     Hand Dominance     Extremity/Trunk Assessment Upper Extremity Assessment Upper Extremity Assessment: Overall WFL for tasks assessed (grossly 4/5 bilaterally, ROM WFL)   Lower Extremity Assessment Lower Extremity Assessment: Defer to PT evaluation;Generalized weakness RLE Deficits / Details: plantar flexor contracture on the R w/ noted R knee hyperextension in standing LLE Deficits / Details: decreased DF ROM unable to achieve neutral,       Communication Communication Communication: No difficulties   Cognition Arousal/Alertness: Awake/alert Behavior During Therapy: WFL for tasks assessed/performed Overall Cognitive Status: Within Functional Limits for tasks assessed  General Comments       Exercises    Shoulder Instructions      Home Living Family/patient expects to be discharged to:: Private residence Living Arrangements: Spouse/significant other Available Help at Discharge: Family;Available 24 hours/day Type of Home: House Home Access: Ramped entrance     Home Layout: One level     Bathroom Shower/Tub: Engineer, site: Standard Bathroom Accessibility: Yes   Home Equipment: Environmental consultant - 2 wheels;Bedside commode;Other (comment) (transport chair)          Prior Functioning/Environment Level of Independence: Needs assistance  Gait / Transfers Assistance Needed: pt transfers and ambulates within the home using a RW, cannot transfer to toilet and uses a BSC, uses a wc for transporting outside of the home ADL's / Homemaking Assistance Needed: pt requires assistance w/ ADLs of bathing and dressing from spouse; pt takes seated bath in kitchen with spouse providing max A for LB bathing, min A for UB bathing; no falls reported in past 12 mo; pt reports indep with med mgt., spouse cooks/cleans; pt reports "bladder function is inconsistent recently" Communication / Swallowing Assistance Needed: no problems w/ communicating or swallowing Comments: Husband normally at home and able to assist patient        OT Problem List: Decreased strength;Decreased activity tolerance      OT Treatment/Interventions: Self-care/ADL training;Therapeutic exercise;Therapeutic activities;Energy conservation;DME and/or AE instruction;Patient/family education    OT Goals(Current goals can be found in the care plan section) Acute Rehab OT Goals Patient Stated Goal: to get better OT Goal Formulation: With patient Time For Goal Achievement: 05/30/16 Potential to Achieve Goals: Good  OT Frequency: Min 1X/week   Barriers to D/C:            Co-evaluation              End of Session    Activity Tolerance: Patient tolerated treatment well Patient left: in chair;with call bell/phone within reach;with chair alarm set;with nursing/sitter in room  OT Visit Diagnosis: Other abnormalities of gait and mobility (R26.89);Muscle weakness (generalized) (M62.81)                Time: 9604-5409 OT Time Calculation (min): 10 min Charges:  OT General Charges $OT Visit: 1 Procedure OT Evaluation $OT Eval Moderate Complexity: 1  Procedure G-Codes:     Jeni Salles, MPH, MS, OTR/L ascom [phone] 05/16/16, 1:37 PM

## 2016-05-17 ENCOUNTER — Inpatient Hospital Stay (HOSPITAL_COMMUNITY)
Admission: AD | Admit: 2016-05-17 | Discharge: 2016-06-02 | DRG: 059 | Disposition: A | Discharge status: Skilled Nursing Facility | Payer: Medicare Other | Source: Other Acute Inpatient Hospital | Attending: Physical Medicine & Rehabilitation | Admitting: Physical Medicine & Rehabilitation

## 2016-05-17 ENCOUNTER — Ambulatory Visit: Payer: Self-pay | Admitting: Diagnostic Neuroimaging

## 2016-05-17 DIAGNOSIS — Z981 Arthrodesis status: Secondary | ICD-10-CM | POA: Diagnosis not present

## 2016-05-17 DIAGNOSIS — N310 Uninhibited neuropathic bladder, not elsewhere classified: Secondary | ICD-10-CM | POA: Diagnosis not present

## 2016-05-17 DIAGNOSIS — G35 Multiple sclerosis: Secondary | ICD-10-CM | POA: Diagnosis not present

## 2016-05-17 DIAGNOSIS — K592 Neurogenic bowel, not elsewhere classified: Secondary | ICD-10-CM | POA: Diagnosis not present

## 2016-05-17 DIAGNOSIS — N319 Neuromuscular dysfunction of bladder, unspecified: Secondary | ICD-10-CM

## 2016-05-17 DIAGNOSIS — R32 Unspecified urinary incontinence: Secondary | ICD-10-CM | POA: Diagnosis not present

## 2016-05-17 DIAGNOSIS — G808 Other cerebral palsy: Secondary | ICD-10-CM | POA: Diagnosis not present

## 2016-05-17 DIAGNOSIS — Z853 Personal history of malignant neoplasm of breast: Secondary | ICD-10-CM | POA: Diagnosis not present

## 2016-05-17 DIAGNOSIS — N390 Urinary tract infection, site not specified: Secondary | ICD-10-CM | POA: Diagnosis not present

## 2016-05-17 DIAGNOSIS — Z9221 Personal history of antineoplastic chemotherapy: Secondary | ICD-10-CM

## 2016-05-17 DIAGNOSIS — G8929 Other chronic pain: Secondary | ICD-10-CM | POA: Diagnosis not present

## 2016-05-17 DIAGNOSIS — N3644 Muscular disorders of urethra: Secondary | ICD-10-CM

## 2016-05-17 DIAGNOSIS — B019 Varicella without complication: Secondary | ICD-10-CM

## 2016-05-17 DIAGNOSIS — E8809 Other disorders of plasma-protein metabolism, not elsewhere classified: Secondary | ICD-10-CM

## 2016-05-17 DIAGNOSIS — B029 Zoster without complications: Secondary | ICD-10-CM | POA: Diagnosis not present

## 2016-05-17 DIAGNOSIS — G801 Spastic diplegic cerebral palsy: Secondary | ICD-10-CM

## 2016-05-17 DIAGNOSIS — I359 Nonrheumatic aortic valve disorder, unspecified: Secondary | ICD-10-CM | POA: Diagnosis not present

## 2016-05-17 DIAGNOSIS — E46 Unspecified protein-calorie malnutrition: Secondary | ICD-10-CM

## 2016-05-17 DIAGNOSIS — I352 Nonrheumatic aortic (valve) stenosis with insufficiency: Secondary | ICD-10-CM

## 2016-05-17 DIAGNOSIS — G8220 Paraplegia, unspecified: Secondary | ICD-10-CM | POA: Diagnosis not present

## 2016-05-17 DIAGNOSIS — I890 Lymphedema, not elsewhere classified: Secondary | ICD-10-CM

## 2016-05-17 DIAGNOSIS — Z7901 Long term (current) use of anticoagulants: Secondary | ICD-10-CM | POA: Diagnosis not present

## 2016-05-17 DIAGNOSIS — G40209 Localization-related (focal) (partial) symptomatic epilepsy and epileptic syndromes with complex partial seizures, not intractable, without status epilepticus: Secondary | ICD-10-CM | POA: Diagnosis not present

## 2016-05-17 DIAGNOSIS — D696 Thrombocytopenia, unspecified: Secondary | ICD-10-CM | POA: Diagnosis not present

## 2016-05-17 DIAGNOSIS — B952 Enterococcus as the cause of diseases classified elsewhere: Secondary | ICD-10-CM | POA: Diagnosis not present

## 2016-05-17 DIAGNOSIS — K589 Irritable bowel syndrome without diarrhea: Secondary | ICD-10-CM | POA: Diagnosis not present

## 2016-05-17 DIAGNOSIS — Z79899 Other long term (current) drug therapy: Secondary | ICD-10-CM

## 2016-05-17 DIAGNOSIS — D62 Acute posthemorrhagic anemia: Secondary | ICD-10-CM

## 2016-05-17 DIAGNOSIS — K5901 Slow transit constipation: Secondary | ICD-10-CM | POA: Diagnosis not present

## 2016-05-17 DIAGNOSIS — F4323 Adjustment disorder with mixed anxiety and depressed mood: Secondary | ICD-10-CM

## 2016-05-17 DIAGNOSIS — M4802 Spinal stenosis, cervical region: Secondary | ICD-10-CM | POA: Diagnosis not present

## 2016-05-17 DIAGNOSIS — N3281 Overactive bladder: Secondary | ICD-10-CM | POA: Diagnosis not present

## 2016-05-17 DIAGNOSIS — G40909 Epilepsy, unspecified, not intractable, without status epilepticus: Secondary | ICD-10-CM | POA: Diagnosis not present

## 2016-05-17 DIAGNOSIS — R4182 Altered mental status, unspecified: Secondary | ICD-10-CM | POA: Diagnosis not present

## 2016-05-17 DIAGNOSIS — B9689 Other specified bacterial agents as the cause of diseases classified elsewhere: Secondary | ICD-10-CM | POA: Diagnosis not present

## 2016-05-17 DIAGNOSIS — N39490 Overflow incontinence: Secondary | ICD-10-CM | POA: Diagnosis not present

## 2016-05-17 DIAGNOSIS — R29898 Other symptoms and signs involving the musculoskeletal system: Secondary | ICD-10-CM | POA: Diagnosis not present

## 2016-05-17 LAB — CREATININE, SERUM: Creatinine, Ser: 0.72 mg/dL (ref 0.44–1.00)

## 2016-05-17 LAB — CBC
HEMATOCRIT: 34.6 % — AB (ref 36.0–46.0)
Hemoglobin: 11.7 g/dL — ABNORMAL LOW (ref 12.0–15.0)
MCH: 30.3 pg (ref 26.0–34.0)
MCHC: 33.8 g/dL (ref 30.0–36.0)
MCV: 89.6 fL (ref 78.0–100.0)
Platelets: 125 10*3/uL — ABNORMAL LOW (ref 150–400)
RBC: 3.86 MIL/uL — ABNORMAL LOW (ref 3.87–5.11)
RDW: 11.9 % (ref 11.5–15.5)
WBC: 4.7 10*3/uL (ref 4.0–10.5)

## 2016-05-17 MED ORDER — INTERFERON BETA-1A 44 MCG/0.5ML ~~LOC~~ SOSY
44.0000 ug | PREFILLED_SYRINGE | SUBCUTANEOUS | Status: DC
  Filled 2016-05-17: qty 0.5

## 2016-05-17 MED ORDER — SODIUM CHLORIDE 0.9% FLUSH
10.0000 mL | Freq: Two times a day (BID) | INTRAVENOUS | Status: DC
  Administered 2016-05-20 – 2016-05-23 (×2): 10 mL
  Administered 2016-05-25: 20 mL

## 2016-05-17 MED ORDER — AMANTADINE HCL 100 MG PO CAPS
100.0000 mg | ORAL_CAPSULE | Freq: Three times a day (TID) | ORAL | Status: DC
  Administered 2016-05-17 – 2016-06-02 (×47): 100 mg via ORAL
  Filled 2016-05-17 (×49): qty 1

## 2016-05-17 MED ORDER — OXYCODONE HCL 10 MG PO TABS: 10.0000 mg | ORAL_TABLET | Freq: Four times a day (QID) | ORAL | 0 refills | Status: DC | PRN

## 2016-05-17 MED ORDER — FENTANYL 50 MCG/HR TD PT72: 50.0000 ug | MEDICATED_PATCH | TRANSDERMAL | 0 refills | Status: DC

## 2016-05-17 MED ORDER — ENOXAPARIN SODIUM 40 MG/0.4ML ~~LOC~~ SOLN
40.0000 mg | SUBCUTANEOUS | Status: DC
  Administered 2016-05-17 – 2016-06-01 (×16): 40 mg via SUBCUTANEOUS
  Filled 2016-05-17 (×16): qty 0.4

## 2016-05-17 MED ORDER — FENTANYL 25 MCG/HR TD PT72
50.0000 ug | MEDICATED_PATCH | TRANSDERMAL | Status: DC
  Administered 2016-05-18 – 2016-06-02 (×6): 50 ug via TRANSDERMAL
  Filled 2016-05-17 (×7): qty 2

## 2016-05-17 MED ORDER — CEPHALEXIN 500 MG PO CAPS: 500.0000 mg | ORAL_CAPSULE | Freq: Four times a day (QID) | ORAL | 0 refills | Status: DC

## 2016-05-17 MED ORDER — SODIUM CHLORIDE 0.9% FLUSH
10.0000 mL | INTRAVENOUS | Status: DC | PRN
  Administered 2016-05-19 – 2016-06-01 (×11): 10 mL
  Filled 2016-05-17 (×11): qty 40

## 2016-05-17 MED ORDER — BACLOFEN 10 MG PO TABS
10.0000 mg | ORAL_TABLET | Freq: Three times a day (TID) | ORAL | Status: DC
  Administered 2016-05-17: 10 mg via ORAL
  Filled 2016-05-17: qty 1

## 2016-05-17 MED ORDER — LEVETIRACETAM 500 MG PO TABS
500.0000 mg | ORAL_TABLET | Freq: Two times a day (BID) | ORAL | Status: DC
  Administered 2016-05-17 – 2016-06-02 (×32): 500 mg via ORAL
  Filled 2016-05-17 (×32): qty 1

## 2016-05-17 MED ORDER — ONDANSETRON HCL 4 MG/2ML IJ SOLN: 4.0000 mg | Freq: Four times a day (QID) | INTRAMUSCULAR | Status: DC | PRN

## 2016-05-17 MED ORDER — ACETAMINOPHEN 650 MG RE SUPP: 650.0000 mg | Freq: Four times a day (QID) | RECTAL | Status: DC | PRN

## 2016-05-17 MED ORDER — ONDANSETRON HCL 4 MG PO TABS: 4.0000 mg | ORAL_TABLET | Freq: Four times a day (QID) | ORAL | Status: DC | PRN

## 2016-05-17 MED ORDER — HOME MED STORE IN PYXIS: 1.0000 | Status: DC

## 2016-05-17 MED ORDER — ONDANSETRON HCL 4 MG PO TABS
4.0000 mg | ORAL_TABLET | Freq: Four times a day (QID) | ORAL | Status: DC | PRN
  Administered 2016-05-26: 4 mg via ORAL
  Filled 2016-05-17: qty 1

## 2016-05-17 MED ORDER — ENOXAPARIN SODIUM 40 MG/0.4ML ~~LOC~~ SOLN: 40.0000 mg | SUBCUTANEOUS | Status: DC

## 2016-05-17 MED ORDER — OCUVITE-LUTEIN PO CAPS
1.0000 | ORAL_CAPSULE | Freq: Two times a day (BID) | ORAL | Status: DC
  Administered 2016-05-17 – 2016-05-21 (×8): 1 via ORAL
  Filled 2016-05-17 (×9): qty 1

## 2016-05-17 MED ORDER — SORBITOL 70 % SOLN
30.0000 mL | Freq: Every day | Status: DC | PRN
  Administered 2016-05-21 – 2016-05-26 (×3): 30 mL via ORAL
  Filled 2016-05-17 (×3): qty 30

## 2016-05-17 MED ORDER — CEPHALEXIN 250 MG PO CAPS
500.0000 mg | ORAL_CAPSULE | Freq: Four times a day (QID) | ORAL | Status: DC
  Administered 2016-05-17 – 2016-06-01 (×60): 500 mg via ORAL
  Filled 2016-05-17 (×60): qty 2

## 2016-05-17 MED ORDER — BACLOFEN 20 MG PO TABS
20.0000 mg | ORAL_TABLET | Freq: Three times a day (TID) | ORAL | Status: DC
  Administered 2016-05-17 – 2016-06-02 (×46): 20 mg via ORAL
  Filled 2016-05-17 (×48): qty 1

## 2016-05-17 MED ORDER — HEPARIN SOD (PORK) LOCK FLUSH 100 UNIT/ML IV SOLN
500.0000 [IU] | Freq: Once | INTRAVENOUS | Status: AC
  Administered 2016-05-17: 500 [IU] via INTRAVENOUS
  Filled 2016-05-17: qty 5

## 2016-05-17 MED ORDER — ACETAMINOPHEN 325 MG PO TABS
650.0000 mg | ORAL_TABLET | Freq: Four times a day (QID) | ORAL | Status: DC | PRN
  Administered 2016-05-25 – 2016-05-31 (×8): 650 mg via ORAL
  Filled 2016-05-17 (×8): qty 2

## 2016-05-17 MED ORDER — OXYCODONE HCL 5 MG PO TABS
5.0000 mg | ORAL_TABLET | Freq: Four times a day (QID) | ORAL | Status: DC | PRN
  Administered 2016-05-23 – 2016-06-01 (×21): 5 mg via ORAL
  Filled 2016-05-17 (×22): qty 1

## 2016-05-17 MED ORDER — OXYBUTYNIN CHLORIDE ER 10 MG PO TB24
20.0000 mg | ORAL_TABLET | Freq: Every day | ORAL | Status: DC
  Administered 2016-05-18 – 2016-05-20 (×3): 20 mg via ORAL
  Filled 2016-05-17 (×4): qty 2

## 2016-05-17 NOTE — H&P (Signed)
[] Hover for attribution information    Physical Medicine and Rehabilitation Admission H&P       Chief Complaint  Patient presents with  . Weakness  : HPI: 56 year old right handed female with history of chronic pain, lymphedema of both lower extremities, aortic valve disease, right breast cancer November 2015 with chemotherapy, seizure disorder, multiple sclerosis diagnosed 2001 maintained on interferon. Per chart review patient lives with spouse. One level home with ramped entrance. Patient transfers and ambulates within the home using a rolling walker. Presented 05/14/2016 to [facility] with bilateral lower extremity weakness over the past 4-5 days. Denied any recent fall. Patient had denied any recent flareups or exacerbations of her MS in the recent past. MRI of the brain, cervical and thoracic spine reviewed by neurology consistent with MS no suggestion of active disease. Placed on intravenous Solu-Medrol 3 days and completed. Suspect UTI initially placed on intravenous Rocephin and change to Keflex. Subcutaneous Lovenox for DVT prophylaxis. Maintain on a regular diet. Physical occupational therapy evaluations completed. Patient was admitted for comprehensive rehabilitation program  Review of Systems  Constitutional: Negative for chills and fever.  HENT: Negative for tinnitus.   Eyes: Negative for blurred vision and double vision.  Respiratory: Negative for cough and shortness of breath.   Cardiovascular: Positive for leg swelling. Negative for chest pain and palpitations.  Gastrointestinal: Positive for constipation. Negative for nausea and vomiting.  Genitourinary: Positive for urgency. Negative for dysuria, flank pain and hematuria.  Musculoskeletal: Positive for back pain, joint pain and myalgias.  Skin: Negative for rash.  Neurological: Positive for seizures and weakness.  All other systems reviewed and are negative.      Past Medical History:  Diagnosis Date  . Aortic valve  disease    Mild AS / AI - most recent Echo demonstrated tricuspid aortic valve.  . Bacterial endocarditis    History of .  Marland Kitchen Bilateral lower extremity edema    Noncardiac.  Chronic. LE Venous dopplers - negative for DVT.; Echocardiogram January 2016: Normal EF with normal wall motion and valve function. Only grade 1 diastolic dysfunction. EF 60-65%. Mild MR  . Breast cancer ([place]) 12-31-13   Right breast, 12:00, 1.5 cm, T1c,N0 invasive mammary carcinoma, triple negative. --> Rx with Chemo  . Cervical stenosis of spine   . Herpes zoster   . IBS (irritable bowel syndrome)   . Lymphedema    [facility] legs [facility]  . Multiple sclerosis ([place]) 2001   Walks from room to room @ home; but Wheelchair when going out.  Marland Kitchen Neuromuscular disorder ([place])    MS  . Seizures ([place])    Takes Keppra  . Syncope and collapse         Past Surgical History:  Procedure Laterality Date  . ANKLE SURGERY     Left  . ANKLE SURGERY    . ANTERIOR CERVICAL DECOMP/DISCECTOMY FUSION  11/17/2011   Procedure: ANTERIOR CERVICAL DECOMPRESSION/DISCECTOMY FUSION 2 LEVELS;  Surgeon: Erline Levine, MD;  Location: [place] NEURO ORS;  Service: Neurosurgery;  Laterality: N/A;  Cervical Five-Six Six-Seven Anterior cervical decompression/diskectomy/fusion  . BREAST BIOPSY Right 12-31-13   invasive mammary  . BREAST SURGERY Right 02/03/2014   Right simple mastectomy with sentinel node biopsy.  . CHOLECYSTECTOMY    . COLONOSCOPY  2014  . Lower extremity venous Dopplers  Feb 27, 2013   No LE DVT  . MASTECTOMY    . Port a cath insertion Right 01/19/2010  . PORT-A-CATH REMOVAL  right  . PORT-A-CATH REMOVAL Right 09/03/2013   Procedure: REMOVAL PORT-A-CATH;  Surgeon: Conrad Napoleon, MD;  Location: [place];  Service: Vascular;  Laterality: Right;  . TRANSTHORACIC ECHOCARDIOGRAM  03/2013; 02/2014   a) Normal LV size and function with EF 60-65%.; Cannot exclude bicuspid aortic valve with mild AS and  mild AI.; b) Normal EF with normal wall motion and valve function x Mild MR. G2 DD. EF 60-65%. Tricuspid AoV  . UPPER GI ENDOSCOPY  2014         Family History  Problem Relation Age of Onset  . Cancer Father     skin  . Heart disease Father   . Heart attack Father     heart attack in his 13's  . Thyroid disease Sister   . Ovarian cancer Cousin   . Breast cancer Maternal Aunt 60  . Breast cancer Maternal Grandmother 74   Social History:  reports that she has never smoked. She has never used smokeless tobacco. She reports that she does not drink alcohol or use drugs. Allergies:       Allergies  Allergen Reactions  . Fentanyl Nausea And Vomiting and Nausea Only    vomiting Was given in PACU x3 each time patient got sick vomiting Was given in PACU x3 each time patient got sick  . Sulfa Antibiotics Hives and Other (See Comments)    Light headed, over heated         Medications Prior to Admission  Medication Sig Dispense Refill  . amantadine (SYMMETREL) 100 MG capsule Take 1 capsule (100 mg total) by mouth 3 (three) times daily. 90 capsule 12  . baclofen (LIORESAL) 10 MG tablet Take 1-2 tablets (10-20 mg total) by mouth 3 (three) times daily. 150 tablet 12  . Cholecalciferol (VITAMIN D3) 2000 UNITS capsule Take 2,000 Units by mouth daily.    . Cranberry 400 MG TABS Take 1 capsule by mouth daily. Also has magnesium, calcium and vit c    . GLUCOSAMINE-CHONDROITIN PO Take 1 tablet by mouth 4 (four) times daily. STRENGTH: 1500-1200     . ibuprofen (ADVIL,MOTRIN) 600 MG tablet TAKE 1 TABLET (600 MG TOTAL) BY MOUTH EVERY 6 (SIX) HOURS AS NEEDED. 60 tablet 2  . Interferon Beta-1a (REBIF REBIDOSE) 44 MCG/0.5ML SOAJ Inject 0.5 mLs into the skin 3 (three) times a week. (Patient taking differently: Inject 0.5 mLs into the skin 3 (three) times a week. Monday, Wednesday and Friday) 18 mL 4  . levETIRAcetam (KEPPRA) 500 MG tablet Take 1 tablet (500 mg total) by mouth 2 (two)  times daily. 180 tablet 4  . lidocaine-prilocaine (EMLA) cream Apply 1 application topically once. Apply to port then cover with saran wrap 1-2 hours before chemotherapy    . loratadine (CLARITIN) 10 MG tablet Take 10 mg by mouth daily.    . Lutein-Zeaxanthin 25-5 MG CAPS Take 1 capsule by mouth 2 (two) times daily.    . Misc Natural Products (LEG VEIN & CIRCULATION) TABS Take 1 tablet by mouth 2 (two) times daily.     . Multiple Vitamins-Minerals (MULTIVITAMIN PO) Take 1 tablet by mouth daily.    Marland Kitchen oxybutynin (DITROPAN-XL) 10 MG 24 hr tablet TAKE 2 TABLETS BY MOUTH DAILY 180 tablet 3  . prochlorperazine (COMPAZINE) 10 MG tablet Take 1 tablet (10 mg total) by mouth every 6 (six) hours as needed. 60 tablet 5  . Turmeric 500 MG CAPS Take 1 tablet by mouth daily.    . [DISCONTINUED] fentaNYL (  DURAGESIC - DOSED MCG/HR) 50 MCG/HR Place 1 patch (50 mcg total) onto the skin every 3 (three) days. 10 patch 0  . [DISCONTINUED] Oxycodone HCl 10 MG TABS Take 1 tablet (10 mg total) by mouth every 6 (six) hours as needed. 120 tablet 0    Home: Home Living Family/patient expects to be discharged to:: Private residence Living Arrangements: Spouse/significant other Available Help at Discharge: Family, Available 24 hours/day Type of Home: House Home Access: Ramped entrance Home Layout: One level Bathroom Shower/Tub: Chiropodist: Standard Bathroom Accessibility: Yes Home Equipment: Environmental consultant - 2 wheels, Bedside commode, Other (comment) (transport chair)   Functional History: Prior Function Level of Independence: Needs assistance Gait / Transfers Assistance Needed: pt transfers and ambulates within the home using a RW, cannot transfer to toilet and uses a BSC, uses a wc for transporting outside of the home ADL's / Homemaking Assistance Needed: pt requires assistance w/ ADLs of bathing and dressing from spouse; pt takes seated bath in kitchen with spouse providing max A for LB  bathing, min A for UB bathing; no falls reported in past 12 mo; pt reports indep with med mgt., spouse cooks/cleans; pt reports "bladder function is inconsistent recently" Communication / Swallowing Assistance Needed: no problems w/ communicating or swallowing Comments: Husband normally at home and able to assist patient  Functional Status:  Mobility: Bed Mobility Overal bed mobility: Needs Assistance Bed Mobility: Supine to Sit Supine to sit: Mod assist General bed mobility comments: did not assess, pt up in recliner for session Transfers Overall transfer level: Needs assistance Transfers: Sit to/from Stand Sit to Stand: Mod assist, +2 physical assistance General transfer comment: deferred due to RN need to start IV Ambulation/Gait Ambulation/Gait assistance: Mod assist, +2 physical assistance Ambulation Distance (Feet): 2 Feet Assistive device: Rolling walker (2 wheeled) Gait Pattern/deviations: Step-to pattern, Decreased step length - right, Decreased step length - left, Shuffle General Gait Details: pt hyperextends R knee in stance phase due to decreased DF, takes small shuffling steps, requires +2 assistance to maintain balance and promote weight shifting and proximal control w/ ambulation  Gait velocity interpretation: <1.8 ft/sec, indicative of risk for recurrent falls  ADL: ADL Overall ADL's : Needs assistance/impaired Eating/Feeding: Set up, Sitting Grooming: Set up, Sitting Upper Body Bathing: Minimal assistance, Bed level Lower Body Bathing: Maximal assistance, Bed level Upper Body Dressing : Set up, Sitting Lower Body Dressing: Maximal assistance, Sitting/lateral leans Functional mobility during ADLs: Moderate assistance, +2 for physical assistance, Rolling walker General ADL Comments: generally Max A for LB ADL  Cognition: Cognition Overall Cognitive Status: Within Functional Limits for tasks assessed Orientation Level: Oriented X4 Cognition Arousal/Alertness:  Awake/alert Behavior During Therapy: WFL for tasks assessed/performed Overall Cognitive Status: Within Functional Limits for tasks assessed  Physical Exam: Blood pressure 118/64, pulse 73, temperature 98.7 F (37.1 C), resp. rate 20, height 5\' 4"  (1.626 m), weight 79.4 kg (175 lb), SpO2 97 %. Physical Exam  Vitals reviewed. HENT:  Head: Normocephalic.  Eyes: EOM are normal. Left eye exhibits no discharge.  Neck: Normal range of motion. Neck supple. No thyromegaly present.  Cardiovascular: Normal rate and regular rhythm.   Respiratory: Effort normal and breath sounds normal. No respiratory distress.  GI: Soft. Bowel sounds are normal. She exhibits no distension.   Skin. Chronic lymphedema lower extremities with Coban dressings over both legs. 1+ surrounding edema. Skin dry Neurological. Alert and oriented 3. Fair awareness of deficits. Functional memory and insight. Sees to all fields with confrontation testing.  Tracks in all directions (?mildly dysconjugate) Upper extremity strength 5 out of 5 with decreased coordination LUE with FTN, mild left pronator drift. LLE: 2/5 HF, 1+ KE and 2+ ADF/PF, limited by extensor tone (2/4). RLE: 1/5HF and KE and 1+/5 ADF/PF limited by more substantial extensor tone 3/4. Right heel cord contracture of at least 15 degrees. Does sense pain and light touch in all 4's but may be sl decreased in the legs. DTR's are 2+ in both LE's.  Psych: pleasant and cooperative      Medical Problem List and Plan: 1.  Decreased functional mobility secondary to MS exacerbation/UTI. Continue interferon as directed             -admit to inpatient rehab 2.  DVT Prophylaxis/Anticoagulation: Subcutaneous Lovenox. Monitor for any bleeding episodes 3. Pain Management/chronic pain: Baclofen 10 mg 3 times a day, Duragesic patch 50 g, oxycodone as needed 4. Mood: Provide emotional support 5. Neuropsych: This patient is capable of making decisions on her own behalf. 6.  Skin/Wound Care/chronic lymphedema: Routine skin care bilateral lower extremities 7. Fluids/Electrolytes/Nutrition: Routine I&O with follow-up chemistries 8. UTI/neurogenic bladder. Complete course of Keflex. Continue Ditropan. Check PVR 3             -pt reports very overactive bladder at home 9. Seizure disorder. Keppra 500 mg twice a day 10. Constipation. 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 (tension PRAFO RLE)             -aggressive ROM with therapy 12. Chronic lymphedema--continue compression wraps. Pt has Unna boots at home which husband will bring   Post Admission Physician Evaluation: 1. Functional deficits secondary  to MS exacerbation. 2. Patient is admitted to receive collaborative, interdisciplinary care between the physiatrist, rehab nursing staff, and therapy team. 3. Patient's level of medical complexity and substantial therapy needs in context of that medical necessity cannot be provided at a lesser intensity of care such as a SNF. 4. Patient has experienced substantial functional loss from his/her baseline which was documented above under the "Functional History" and "Functional Status" headings.  Judging by the patient's diagnosis, physical exam, and functional history, the patient has potential for functional progress which will result in measurable gains while on inpatient rehab.  These gains will be of substantial and practical use upon discharge  in facilitating mobility and self-care at the household level. 5. Physiatrist will provide 24 hour management of medical needs as well as oversight of the therapy plan/treatment and provide guidance as appropriate regarding the interaction of the two. 6. The Preadmission Screening has been reviewed and patient status is unchanged unless otherwise stated above. 7. 24 hour rehab nursing will assist with bladder management, bowel management, safety, skin/wound care,  disease management, medication administration, pain management and patient education  and help integrate therapy concepts, techniques,education, etc. 8. PT will assess and treat for/with: Lower extremity strength, range of motion, stamina, balance, functional mobility, safety, adaptive techniques and equipment, NMR, spasticity mgt, orthotics, family education, lymphedema rx.   Goals are: supervision. 9. OT will assess and treat for/with: ADL's, functional mobility, safety, upper extremity strength, adaptive techniques and equipment, NMR, spasticity mgt, family education, .   Goals are: supervision to min assist. Therapy may proceed with showering this patient. 10. SLP will assess and treat for/with: n/a at this time.  Goals are: n/a at this time. 11. Case Management and Education officer, museum  will assess and treat for psychological issues and discharge planning. 12. Team conference will be held weekly to assess progress toward goals and to determine barriers to discharge. 13. Patient will receive at least 3 hours of therapy per day at least 5 days per week. 14. ELOS: 16-22 days       15. Prognosis:  excellent     Meredith Staggers, MD, [place] [facility] 05/17/2016  Cathlyn Parsons., PA-C 05/17/2016

## 2016-05-17 NOTE — Progress Notes (Signed)
Social Work Assessment and Plan Social Work Assessment and Plan  Patient Details  Name: Rebecca [name] MRN: [id number] Date of Birth: 05-05-1960  Today's Date: 05/17/2016  Problem List:  Patient Active Problem List   Diagnosis Date Noted  . Multiple sclerosis exacerbation ([place]) 05/17/2016  . Neurogenic bladder 05/17/2016  . Multiple sclerosis ([place]) 05/14/2016  . Encounter for screening for cervical cancer  11/27/2015  . Preventative health care 11/25/2015  . History of breast cancer in female 10/28/2015  . Anemia 10/28/2015  . Thrombocytopenia ([place]) 10/28/2015  . Allergic rhinitis 10/28/2015  . IBS (irritable bowel syndrome) 10/28/2015  . Encounter for eye exam 08/25/2015  . Aortic valve disease   . Heart murmur 10/24/2014  . 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 Lymphedema of both lower extremities 10/24/2014  . History of right mastectomy 10/24/2014  . Need for immunization against influenza 10/24/2014  . Medicare annual wellness visit, subsequent 10/24/2014  . MS (multiple sclerosis) ([place]) 09/16/2014  . Hematoma complicating a procedure 03/06/2014  . Primary cancer of right female breast ([place]) 01/10/2014  . SOB (shortness of breath) 03/01/2013  . Bilateral lower extremity edema   . 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 Herpes zoster 06/08/2011   Past Medical History:  Past Medical History:  Diagnosis Date  . Aortic valve disease    Mild AS / AI - most recent Echo demonstrated tricuspid aortic valve.  . Bacterial endocarditis    History of .  Marland Kitchen Bilateral lower extremity edema    Noncardiac.  Chronic. LE Venous dopplers - negative for DVT.; Echocardiogram January 2016: Normal EF with normal wall motion and valve function. Only grade 1 diastolic dysfunction. EF 60-65%. Mild MR  . Breast cancer ([place]) 12-31-13   Right breast, 12:00, 1.5 cm, T1c,N0 invasive mammary carcinoma, triple negative. --> Rx with Chemo  . Cervical stenosis of spine   . Herpes zoster   .  IBS (irritable bowel syndrome)   . Lymphedema    [facility] legs [facility]  . Multiple sclerosis ([place]) 2001   Walks from room to room @ home; but Wheelchair when going out.  Marland Kitchen Neuromuscular disorder ([place])    MS  . Seizures ([place])    Takes Keppra  . Syncope and collapse    Past Surgical History:  Past Surgical History:  Procedure Laterality Date  . ANKLE SURGERY     Left  . ANKLE SURGERY    . ANTERIOR CERVICAL DECOMP/DISCECTOMY FUSION  11/17/2011   Procedure: ANTERIOR CERVICAL DECOMPRESSION/DISCECTOMY FUSION 2 LEVELS;  Surgeon: Erline Levine, MD;  Location: [place] NEURO ORS;  Service: Neurosurgery;  Laterality: N/A;  Cervical Five-Six Six-Seven Anterior cervical decompression/diskectomy/fusion  . BREAST BIOPSY Right 12-31-13   invasive mammary  . BREAST SURGERY Right 02/03/2014   Right simple mastectomy with sentinel node biopsy.  . CHOLECYSTECTOMY    . COLONOSCOPY  2014  . Lower extremity venous Dopplers  Feb 27, 2013   No LE DVT  . MASTECTOMY    . Port a cath insertion Right 01/19/2010  . PORT-A-CATH REMOVAL     right  . PORT-A-CATH REMOVAL Right 09/03/2013   Procedure: REMOVAL PORT-A-CATH;  Surgeon: Conrad Julian, MD;  Location: [place];  Service: Vascular;  Laterality: Right;  . TRANSTHORACIC ECHOCARDIOGRAM  03/2013; 02/2014   a) Normal LV size and function with EF 60-65%.; Cannot exclude bicuspid aortic valve with mild AS and mild AI.; b) Normal EF with normal wall motion and valve function x Mild MR. G2  DD. EF 60-65%. Tricuspid AoV  . UPPER GI ENDOSCOPY  2014   Social History:  reports that she has never smoked. She has never used smokeless tobacco. She reports that she does not drink alcohol or use drugs.  Family / Support Systems Marital Status: Married Patient Roles: Spouse, Other (Comment) (Daughter) Spouse/Significant Other: [name] [phone]  [phone] Other Supports: Rolland Bimler [phone] Anticipated Caregiver: husband Ability/Limitations of Caregiver:  husband does not work and was providing care prior to [employer] and dressing Caregiver Availability: 24/7 Family Dynamics: Pt depends upon husband to assist her with her ADL's. Pt was ambulatory and required little help from husband prior to admission. She wants to get back to this level while here. She has a Aunt how is supportive also. She and husband are a team.  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 Will make sure husband is involved if any decision needs to be made while here. Due to pt's memory is not the best according to her.   Abuse/Neglect Physical Abuse: Denies Verbal Abuse: Denies Sexual Abuse: Denies Exploitation of patient/patient's resources: Denies Self-Neglect: Denies  Emotional Status Pt's affect, behavior adn adjustment status: Pt is tired from her busy day and transferring to CIR. She is very glad to be here and hopeful she will do well here. She has always been independent and able to do most for herself. She does not want to burden her husband any more than she already does. He has health issues also. Recent Psychosocial Issues: other health issues-breast cancern in 2015 and lymphedema in lower extremities Pyschiatric History: No history has been through a lot in the pas tfew years, would benefit from seeing neuro-psych while here for support. Will get input from team. Substance Abuse History: No issues  Patient / Family Perceptions, Expectations & Goals Pt/Family understanding of illness & functional limitations: Pt and husband can explain her MS exacerbation and UTI. Both hope as her UTI is treated she will get her movement back. Both have spoken with  the MD and feel they have a good understanding of her treatment plan.  Premorbid pt/family roles/activities: Wife, Niece, friend, etc Anticipated changes in roles/activities/participation: resume Pt/family expectations/goals: Pt states: " I want to get back to the level I was at before this."  Husband states: " I hope she does well here, I will try to get her daily but our car is old and not that dependable."  [employer]: Other (Comment) (Had OP at [facility] in the past) Premorbid Home Care/DME Agencies: Other (Comment) (Had [facility] one year ago) Transportation available at discharge: Husband Resource referrals recommended: Neuropsychology, Support group (specify)  Discharge Planning Living Arrangements: Spouse/significant other Support Systems: Spouse/significant other, Other relatives, Friends/neighbors Type of Residence: Private residence Insurance Resources: [employer], [employer] (specify county) Museum/gallery curator Resources: [employer] Screen Referred: No Living Expenses: Education officer, community Management: Spouse Does the patient have any problems obtaining your medications?: No Home Management: Husband does the cooking and cleaning at home  Patient/Family Preliminary Plans: Return home with husband providing assist, he was prior to admission with bathing and dressing. Pt was able to ambulate with her rolling walker in the home and used a transport chair outside of the home. Husband has some health issues but can assist her at discharge. Social Work Anticipated  Follow Up Needs: HH/OP, Support Group  Clinical Impression Pleasant exhausted female who just [facility] to CIR. Her husband is very supportive and assist with her care prior to admission and willing to do all he can for her. Pt hopeful she will get mobile while here like she Was prior to admission. Would benefit from neuro-psych seeing with all of her health issues. Will get input from team and will await evaluations  and work on discharge plan.  [name] 05/17/2016, 3:26 PM

## 2016-05-17 NOTE — PMR Pre-admission (Signed)
[facility] PMR Admission Coordinator Pre-Admission Assessment  Patient: Rebecca [name] is an 56 y.o., female MRN: [id number] DOB: January 12, 1961 Height: 5\' 4"  (162.6 cm) Weight: 79.4 kg (175 lb)  Insurance Information HMO: No   PPO:       PCP:       IPA:       80/20:       OTHER:   PRIMARY:  Medicare A/B      Policy#:  [id number]      Subscriber:  Holley Dexter CM Name:        Phone#:       Fax#:   Pre-Cert#:        Employer:  Disabled Benefits:  Phone #:       Name: Checked in [employer]. Date:  10/08/09     Deduct:  $1340      Out of Pocket Max: none      Life Max: unlimited CIR: 100%      SNF: 100 days Outpatient:  80%     Co-Pay: 20% Home Health: 100%      Co-Pay: none DME: 80%     Co-Pay: 20% Providers: patient's choice  SECONDARY:  Medicaid [place]      Policy#: [id number]      Subscriber: patient CM Name:        Phone#:       Fax#:   Pre-Cert#:        Employer:  disabled Benefits:  Phone #: [phone]     Name: Automated Eff. Date:  Eligible 05/17/16 with coverage code [id number]     Deduct:        Out of Pocket Max:        Life Max:   CIR:        SNF:   Outpatient:       Co-Pay:   Home Health:        Co-Pay:   DME:       Co-Pay:    Emergency Contact Information Contact Information    Name Relation Home Work Mobile   Cauthorn,Don Spouse [phone]  [phone]   Rolland Bimler [phone]        Current Medical History  Patient Admitting Diagnosis:  MS exacerbation  History of Present Illness:  A 56 year old right handed female with history of chronic pain, lymphedema of both lower extremities, aortic valve disease, right breast cancer November 2015 with chemotherapy, seizure disorder, multiple sclerosis diagnosed 2001 maintained on interferon. Per chart review patient lives with spouse. One level home with ramped entrance. Patient transfers and ambulates within the home using a rolling walker. Presented 05/14/2016 to [facility] with bilateral lower extremity weakness over  the past 4-5 days. Denied any recent fall. Patient had denied any recent flareups or exacerbations of her MS in the recent past. MRI of the brain, cervical and thoracic spine reviewed by neurology consistent with MS no suggestion of active disease. Placed on intravenous Solu-Medrol 3 days. Suspect UTI initially placed on intravenous Rocephin and change to Keflex. Subcutaneous Lovenox for DVT prophylaxis. Maintain on a regular diet. Physical occupational therapy evaluations completed. Patient to be admitted for comprehensive inpatient rehabilitation program.    Patient's medical record from  [facility] has been reviewed by the rehabilitation admission coordinator and physician.  Past Medical History  Past Medical History:  Diagnosis Date  . Aortic valve disease    Mild AS / AI - most recent Echo demonstrated tricuspid aortic valve.  Marland Kitchen  Bacterial endocarditis    History of .  Marland Kitchen Bilateral lower extremity edema    Noncardiac.  Chronic. LE Venous dopplers - negative for DVT.; Echocardiogram January 2016: Normal EF with normal wall motion and valve function. Only grade 1 diastolic dysfunction. EF 60-65%. Mild MR  . Breast cancer ([place]) 12-31-13   Right breast, 12:00, 1.5 cm, T1c,N0 invasive mammary carcinoma, triple negative. --> Rx with Chemo  . Cervical stenosis of spine   . Herpes zoster   . IBS (irritable bowel syndrome)   . Lymphedema    has legs wrapped at [facility]  . Multiple sclerosis ([place]) 2001   Walks from room to room @ home; but Wheelchair when going out.  Marland Kitchen Neuromuscular disorder ([place])    MS  . Seizures ([place])    Takes Keppra  . Syncope and collapse     Family History   family history includes Breast cancer (age of onset: 70) in her maternal aunt; Breast cancer (age of onset: 35) in her maternal grandmother; Cancer in her father; Heart attack in her father; Heart disease in her father; Ovarian cancer in her cousin; Thyroid disease in her sister.  Prior  Rehab/Hospitalizations Has the patient had major surgery during 100 days prior to admission? No.  Patient had HH therapies about a yr ago for weakness.    Current Medications  See MAR from [facility]  Patients Current Diet:   Heart diet, thin liquids  Precautions / Restrictions Precautions Precautions: Fall Restrictions Weight Bearing Restrictions: No   Has the patient had 2 or more falls or a fall with injury in the past year?No  Prior Activity Level Household: Mostly homebound, went to MD appointments 1-2 X a month  Prior Functional Level Self Care: Did the patient need help bathing, dressing, using the toilet or eating?  Needed some help  Indoor Mobility: Did the patient need assistance with walking from room to room (with or without device)? Independent  Stairs: Did the patient need assistance with internal or external stairs (with or without device)? Dependent  Functional Cognition: Did the patient need help planning regular tasks such as shopping or remembering to take medications? [place] Devices/Equipment: (S) Environmental consultant (specify type), Transport planner, Wheelchair (rolling and skis in the back) Home Equipment: Environmental consultant - 2 wheels, Bedside commode, Other (comment) (transport chair)  Prior Device Use: Indicate devices/aids used by the patient prior to current illness, exacerbation or injury? Walker and 3 prong cane and a transport chair.   Prior Functional Level Current Functional Level  Bed Mobility  Independent  Mod assist   Transfers  Independent  Mod assist   Mobility - Walk/Wheelchair  Independent  Mod assist (Ambulated 2 feet RW)   Upper Body Dressing  Independent  Other (Set up)   Lower Body Dressing  Min assist  Max assist   Grooming  Independent  Other (Set up)   Eating/Drinking  Independent  Other (Set up)   Toilet Transfer  Min assist  Max assist   Bladder  Continence   Used BSC at home  Incontinence at times   Bowel Management  WDL daily  Last BM 05/17/16   Stair Climbing  Dependent Other (Not tried)   Communication  Intact and verbal   Verbal and intact   Memory  Intact  WDL   Cooking/Meal Prep  Husband does this      Housework  Husband does this    Money Management  Independent    Driving  Not since 3419     Special needs/care consideration BiPAP/CPAP No CPM No Continuous Drip IV No  Dialysis No       Life Vest No Oxygen No Special Bed No Trach Size No Wound Vac (area) No     Skin Dry skin                              Bowel mgmt: Last BM 05/17/16 Bladder mgmt: Using BSC and bedpan, with incontinence Diabetic mgmt No  Previous Home Environment Living Arrangements: Spouse/significant other Available Help at Discharge: Family, Available 24 hours/day Type of Home: House Home Layout: One level Home Access: Ramped entrance Bathroom Shower/Tub: Optometrist: Yes Home Care Services: No  Discharge Living Setting Plans for Discharge Living Setting: Patient's home, House, Lives with (comment) (Lives with husband.) Type of Home at Discharge: House Discharge Home Layout: One level Discharge Home Access: [place] entrance Discharge Bathroom Shower/Tub: Tub/shower unit Discharge Bathroom Toilet: Standard Discharge Bathroom Accessibility: Yes How Accessible: Accessible via walker Does the patient have any problems obtaining your medications?: No  Social/Family/Support Systems Patient Roles: Spouse (Has a husband.) Contact Information: Claudie Brickhouse - spouse Anticipated Caregiver: husband Anticipated Caregiver's Contact Information: [name] - husband - (c) [phone] Ability/Limitations of Caregiver:  Husband stays at home, is not working, assists patient Caregiver Availability: 24/7 Discharge Plan Discussed with Primary Caregiver: Yes Is Caregiver In Agreement with  Plan?: Yes Does Caregiver/Family have Issues with Lodging/Transportation while Pt is in Rehab?: Yes (Car is old and may have trouble coming to [facility]  Goals/Additional Needs Patient/Family Goal for Rehab: PT/OT supervision to min assist goals Expected length of stay: 7-10 days Cultural Considerations: None Dietary Needs: Heart diet, thin liquids Equipment Needs: TBD Pt/Family Agrees to Admission and willing to participate: Yes (Spoke with patient and spouse in patient's room.) Program Orientation Provided & Reviewed with Pt/Caregiver Including Roles  & Responsibilities: Yes  Patient Condition:  Rehabilitation Physician determined and documented that the patient's condition is appropriate for intensive rehabilitative care in an inpatient rehabilitation facility. Will admit to inpatient rehab today.  Preadmission Screen Completed By:  Retta Diones, 05/17/2016 11:50 AM ______________________________________________________________________   Discussed status with Dr. Naaman Plummer on 05/17/16  at 22 and received telephone approval for admission today.    Assessment/Plan: 1. Diagnosis: MS exacerbation 2. Does the need for close, 24 hr/day  Medical supervision in concert with the patient's rehab needs make it unreasonable for this patient to be served in a less intensive setting? Yes 3. Co-Morbidities requiring supervision/potential complications: AV disease, IBS seizure disorder 4. Due to bladder management, bowel management, safety, skin/wound care, disease management, medication administration, pain management and patient education, does the patient require 24 hr/day rehab nursing? Yes 5. Does the patient require coordinated care of a physician, rehab nurse, PT (1-2 hrs/day, 5 days/week) and OT (1-2 hrs/day, 5 days/week) to address physical and functional deficits in the context of the above medical diagnosis(es)? Yes Addressing deficits in the following areas: balance, endurance, locomotion,  strength, transferring, bowel/bladder control, bathing, dressing, feeding, grooming, toileting and psychosocial support 6. Can the patient actively participate in an intensive therapy program of at least 3 hrs of therapy 5 days a week? Yes 7. The potential for patient to make measurable gains while on inpatient rehab is excellent 8. Anticipated functional outcomes upon discharge from inpatients are: modified independent and supervision PT, modified  independent and supervision OT, n/a SLP 9. Estimated rehab length of stay to reach the above functional goals is: 7-10 days 10. Does the patient have adequate social supports to accommodate these discharge functional goals? Yes 11. Anticipated D/C setting: Home 12. Anticipated post D/C treatments: [place] therapy 13. Overall Rehab/Functional Prognosis: excellent    RECOMMENDATIONS: This patient's condition is appropriate for continued rehabilitative care in the following setting: CIR Patient has agreed to participate in recommended program. Yes Note that insurance prior authorization may be required for reimbursement for recommended care.  Comment: Admit to inpatient rehab today Meredith Staggers, MD, [facility] 05/17/2016  Retta Diones 05/17/2016

## 2016-05-17 NOTE — Progress Notes (Signed)
Report given to [facility].   Patient discharged to CIR via [facility]. Husband at bedside and aware. Madlyn Frankel, RN

## 2016-05-17 NOTE — Discharge Summary (Signed)
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 Aortic valve disease    Mild AS / AI - most recent Echo demonstrated tricuspid aortic valve.  . Bacterial endocarditis    History of .  Marland Kitchen Bilateral lower extremity edema    Noncardiac.  Chronic. LE Venous dopplers - negative for DVT.; Echocardiogram January 2016: Normal EF with normal wall motion and valve function. Only grade 1 diastolic dysfunction. EF 60-65%. Mild MR  . Breast cancer ([place]) 12-31-13   Right breast, 12:00, 1.5 cm, T1c,N0 invasive mammary carcinoma, triple negative. --> Rx with Chemo  . Cervical stenosis of spine   . Herpes zoster   . IBS (irritable bowel syndrome)   . Lymphedema    has legs wrapped at [facility]  . Multiple sclerosis ([place]) 2001   Walks from room to room @ home; but Wheelchair when going out.  Marland Kitchen Neuromuscular disorder ([place])    MS  . Seizures ([place])    Takes Keppra  . Syncope and collapse     HOSPITAL COURSE:   56 year old female with a history of relapsing and remitting MS who presents with lower extremity weakness.  1. Lower extremity weakness: Patient has had MRI of the brain, cervical and thoracic spine which are consistent with MS but no suggestions of active disease. Urinary tract infection explains lower extremity weakness in a patient with MS. Appreciate neurology evaluation PT and OT has Recommended acute inpatient rehabilitation.  2. Urinary tract  infection: She was started on Rocephin. Urine culture shows no growth however patient was symptomatic from UTI. She will be discharged on oral Keflex. 3. Multiple sclerosis: It does not appear the patient had a flare however she was empirically started on high-dose steroids. She completed 3 days of steroid treatment.   DISCHARGE CONDITIONS AND DIET:   Stable for discharge  Heart healthy diet  CONSULTS OBTAINED:  Treatment Team:  Alexis Goodell, MD  DRUG ALLERGIES:   Allergies  Allergen Reactions  . Fentanyl Nausea And Vomiting and Nausea Only    vomiting Was given in PACU x3 each time patient got sick vomiting Was given in PACU x3 each time patient got sick  . Sulfa Antibiotics Hives and Other (See Comments)    Light headed, over heated    DISCHARGE MEDICATIONS:   Current Discharge Medication List    START taking these medications   Details  cephALEXin (KEFLEX) 500 MG capsule Take 1 capsule (500 mg total) by mouth 4 (four) times daily. Qty: 8 capsule, Refills: 0      CONTINUE these medications which have CHANGED   Details  fentaNYL (DURAGESIC - DOSED MCG/HR) 50 MCG/HR Place 1 patch (50 mcg total) onto the skin every 3 (three) days. Qty: 10 patch, Refills: 0    Oxycodone HCl 10 MG TABS Take 1 tablet (10 mg total) by mouth every 6 (six) hours as needed. Qty: 30 tablet, Refills: 0      CONTINUE these  medications which have NOT CHANGED   Details  amantadine (SYMMETREL) 100 MG capsule Take 1 capsule (100 mg total) by mouth 3 (three) times daily. Qty: 90 capsule, Refills: 12    baclofen (LIORESAL) 10 MG tablet Take 1-2 tablets (10-20 mg total) by mouth 3 (three) times daily. Qty: 150 tablet, Refills: 12    Cholecalciferol (VITAMIN D3) 2000 UNITS capsule Take 2,000 Units by mouth daily.    Cranberry 400 MG TABS Take 1 capsule by mouth daily. Also has magnesium, calcium and vit c    GLUCOSAMINE-CHONDROITIN PO Take 1 tablet by mouth 4 (four) times daily. STRENGTH:  1500-1200     ibuprofen (ADVIL,MOTRIN) 600 MG tablet TAKE 1 TABLET (600 MG TOTAL) BY MOUTH EVERY 6 (SIX) HOURS AS NEEDED. Qty: 60 tablet, Refills: 2    Interferon Beta-1a (REBIF REBIDOSE) 44 MCG/0.5ML SOAJ Inject 0.5 mLs into the skin 3 (three) times a week. Qty: 18 mL, Refills: 4    levETIRAcetam (KEPPRA) 500 MG tablet Take 1 tablet (500 mg total) by mouth 2 (two) times daily. Qty: 180 tablet, Refills: 4    lidocaine-prilocaine (EMLA) cream Apply 1 application topically once. Apply to port then cover with saran wrap 1-2 hours before chemotherapy    loratadine (CLARITIN) 10 MG tablet Take 10 mg by mouth daily.    Lutein-Zeaxanthin 25-5 MG CAPS Take 1 capsule by mouth 2 (two) times daily.    Misc Natural Products (LEG VEIN & CIRCULATION) TABS Take 1 tablet by mouth 2 (two) times daily.     Multiple Vitamins-Minerals (MULTIVITAMIN PO) Take 1 tablet by mouth daily.    oxybutynin (DITROPAN-XL) 10 MG 24 hr tablet TAKE 2 TABLETS BY MOUTH DAILY Qty: 180 tablet, Refills: 3    prochlorperazine (COMPAZINE) 10 MG tablet Take 1 tablet (10 mg total) by mouth every 6 (six) hours as needed. Qty: 60 tablet, Refills: 5    Turmeric 500 MG CAPS Take 1 tablet by mouth daily.          Today   CHIEF COMPLAINT:   Patient doing well this morning. Has more strength in her lower extreme base.   VITAL SIGNS:  Blood pressure 118/64, pulse 73, temperature 98.7 F (37.1 C), resp. rate 20, height 5\' 4"  (1.626 m), weight 79.4 kg (175 lb), SpO2 97 %.   REVIEW OF SYSTEMS:  Review of Systems  Constitutional: Negative.  Negative for chills, fever and malaise/fatigue.  HENT: Negative.  Negative for ear discharge, ear pain, hearing loss, nosebleeds and sore throat.   Eyes: Negative.  Negative for blurred vision and pain.  Respiratory: Negative.  Negative for cough, hemoptysis, shortness of breath and wheezing.   Cardiovascular: Negative.  Negative for chest pain, palpitations and leg swelling.   Gastrointestinal: Negative.  Negative for abdominal pain, blood in stool, diarrhea, nausea and vomiting.  Genitourinary: Negative.  Negative for dysuria.  Musculoskeletal: Negative.  Negative for back pain.  Skin: Negative.   Neurological: Negative for dizziness, tremors, speech change, focal weakness, seizures and headaches.  Endo/Heme/Allergies: Negative.  Does not bruise/bleed easily.  Psychiatric/Behavioral: Negative.  Negative for depression, hallucinations and suicidal ideas.     PHYSICAL EXAMINATION:  GENERAL:  56 y.o.-year-old patient lying in the bed with no acute distress.  NECK:  Supple, no jugular venous distention. No thyroid enlargement, no tenderness.  LUNGS: Normal breath sounds bilaterally, no wheezing, rales,rhonchi  No use of accessory muscles of respiration.  CARDIOVASCULAR: S1, S2 normal. No murmurs, rubs, or gallops.  ABDOMEN: Soft, non-tender, non-distended. Bowel  sounds present. No organomegaly or mass.  EXTREMITIES: 1+ edema wrapped in [place] boots no cyanosis, or clubbing.  PSYCHIATRIC: The patient is alert and oriented x 3.  SKIN: No obvious rash, lesion, or ulcer.  Lower extremely muscular strength is 2 out of 5 bilaterally and symmetrically Upper summary strength 5 out of 5.  DATA REVIEW:   CBC  Recent Labs Lab 05/15/16 0610  WBC 3.4*  HGB 11.9*  HCT 34.6*  PLT 111*    Chemistries   Recent Labs Lab 05/15/16 0610  NA 138  K 3.6  CL 104  CO2 26  GLUCOSE 119*  BUN 12  CREATININE 0.42*  CALCIUM 9.0    Cardiac Enzymes  Recent Labs Lab 05/14/16 1355  [place] <0.03    Microbiology Results  @MICRORSLT48 @  RADIOLOGY:  No results found.    Current Discharge Medication List    START taking these medications   Details  cephALEXin (KEFLEX) 500 MG capsule Take 1 capsule (500 mg total) by mouth 4 (four) times daily. Qty: 8 capsule, Refills: 0      CONTINUE these medications which have CHANGED   Details  fentaNYL (DURAGESIC -  DOSED MCG/HR) 50 MCG/HR Place 1 patch (50 mcg total) onto the skin every 3 (three) days. Qty: 10 patch, Refills: 0    Oxycodone HCl 10 MG TABS Take 1 tablet (10 mg total) by mouth every 6 (six) hours as needed. Qty: 30 tablet, Refills: 0      CONTINUE these medications which have NOT CHANGED   Details  amantadine (SYMMETREL) 100 MG capsule Take 1 capsule (100 mg total) by mouth 3 (three) times daily. Qty: 90 capsule, Refills: 12    baclofen (LIORESAL) 10 MG tablet Take 1-2 tablets (10-20 mg total) by mouth 3 (three) times daily. Qty: 150 tablet, Refills: 12    Cholecalciferol (VITAMIN D3) 2000 UNITS capsule Take 2,000 Units by mouth daily.    Cranberry 400 MG TABS Take 1 capsule by mouth daily. Also has magnesium, calcium and vit c    GLUCOSAMINE-CHONDROITIN PO Take 1 tablet by mouth 4 (four) times daily. STRENGTH: 1500-1200     ibuprofen (ADVIL,MOTRIN) 600 MG tablet TAKE 1 TABLET (600 MG TOTAL) BY MOUTH EVERY 6 (SIX) HOURS AS NEEDED. Qty: 60 tablet, Refills: 2    Interferon Beta-1a (REBIF REBIDOSE) 44 MCG/0.5ML SOAJ Inject 0.5 mLs into the skin 3 (three) times a week. Qty: 18 mL, Refills: 4    levETIRAcetam (KEPPRA) 500 MG tablet Take 1 tablet (500 mg total) by mouth 2 (two) times daily. Qty: 180 tablet, Refills: 4    lidocaine-prilocaine (EMLA) cream Apply 1 application topically once. Apply to port then cover with saran wrap 1-2 hours before chemotherapy    loratadine (CLARITIN) 10 MG tablet Take 10 mg by mouth daily.    Lutein-Zeaxanthin 25-5 MG CAPS Take 1 capsule by mouth 2 (two) times daily.    Misc Natural Products (LEG VEIN & CIRCULATION) TABS Take 1 tablet by mouth 2 (two) times daily.     Multiple Vitamins-Minerals (MULTIVITAMIN PO) Take 1 tablet by mouth daily.    oxybutynin (DITROPAN-XL) 10 MG 24 hr tablet TAKE 2 TABLETS BY MOUTH DAILY Qty: 180 tablet, Refills: 3    prochlorperazine (COMPAZINE) 10 MG tablet Take 1 tablet (10 mg total) by mouth every 6 (six)  hours as needed. Qty: 60 tablet, Refills: 5    Turmeric 500 MG CAPS Take 1 tablet by mouth daily.  A  Management plans discussed with the patient and she is in agreement. Stable for discharge   Patient should follow up with pcp and NEUROLOGY  CODE STATUS:     Code Status Orders        Start     Ordered   05/15/16 0143  Full code  Continuous     05/15/16 0142    Code Status History    Date Active Date Inactive Code Status Order ID Comments User Context   This patient has a current code status but no historical code status.      TOTAL TIME TAKING CARE OF THIS PATIENT: 37 minutes.    Note: This dictation was prepared with Dragon dictation along with smaller phrase technology. Any transcriptional errors that result from this process are unintentional.  Iszabella Hebenstreit M.D on 05/17/2016 at 7:20 AM  Between 7am to 6pm - Pager - [phone] After 6pm go to [email or web address] - password [email or web address] [facility]  Office  [phone]  CC: Primary care physician; Enid Derry, MD

## 2016-05-17 NOTE — H&P (Signed)
Physical Medicine and Rehabilitation Admission H&P    Chief Complaint  Patient presents with  . Weakness  : HPI: 56 year old right handed female with history of chronic pain, lymphedema of both lower extremities, aortic valve disease, right breast cancer November 2015 with chemotherapy, seizure disorder, multiple sclerosis diagnosed 2001 maintained on interferon. Per chart review patient lives with spouse. One level home with ramped entrance. Patient transfers and ambulates within the home using a rolling walker. Presented 05/14/2016 to [facility] with bilateral lower extremity weakness over the past 4-5 days. Denied any recent fall. Patient had denied any recent flareups or exacerbations of her MS in the recent past. MRI of the brain, cervical and thoracic spine reviewed by neurology consistent with MS no suggestion of active disease. Placed on intravenous Solu-Medrol 3 days and completed. Suspect UTI initially placed on intravenous Rocephin and change to Keflex. Subcutaneous Lovenox for DVT prophylaxis. Maintain on a regular diet. Physical occupational therapy evaluations completed. Patient was admitted for comprehensive rehabilitation program  Review of Systems  Constitutional: Negative for chills and fever.  HENT: Negative for tinnitus.   Eyes: Negative for blurred vision and double vision.  Respiratory: Negative for cough and shortness of breath.   Cardiovascular: Positive for leg swelling. Negative for chest pain and palpitations.  Gastrointestinal: Positive for constipation. Negative for nausea and vomiting.  Genitourinary: Positive for urgency. Negative for dysuria, flank pain and hematuria.  Musculoskeletal: Positive for back pain, joint pain and myalgias.  Skin: Negative for rash.  Neurological: Positive for seizures and weakness.  All other systems reviewed and are negative.  Past Medical History:  Diagnosis Date  . Aortic valve disease    Mild AS / AI - most recent Echo  demonstrated tricuspid aortic valve.  . Bacterial endocarditis    History of .  Marland Kitchen Bilateral lower extremity edema    Noncardiac.  Chronic. LE Venous dopplers - negative for DVT.; Echocardiogram January 2016: Normal EF with normal wall motion and valve function. Only grade 1 diastolic dysfunction. EF 60-65%. Mild MR  . Breast cancer ([place]) 12-31-13   Right breast, 12:00, 1.5 cm, T1c,N0 invasive mammary carcinoma, triple negative. --> Rx with Chemo  . Cervical stenosis of spine   . Herpes zoster   . IBS (irritable bowel syndrome)   . Lymphedema    has legs wrapped at [facility]  . Multiple sclerosis ([place]) 2001   Walks from room to room @ home; but Wheelchair when going out.  Marland Kitchen Neuromuscular disorder ([place])    MS  . Seizures ([place])    Takes Keppra  . Syncope and collapse    Past Surgical History:  Procedure Laterality Date  . ANKLE SURGERY     Left  . ANKLE SURGERY    . ANTERIOR CERVICAL DECOMP/DISCECTOMY FUSION  11/17/2011   Procedure: ANTERIOR CERVICAL DECOMPRESSION/DISCECTOMY FUSION 2 LEVELS;  Surgeon: Erline Levine, MD;  Location: [facility];  Service: Neurosurgery;  Laterality: N/A;  Cervical Five-Six Six-Seven Anterior cervical decompression/diskectomy/fusion  . BREAST BIOPSY Right 12-31-13   invasive mammary  . BREAST SURGERY Right 02/03/2014   Right simple mastectomy with sentinel node biopsy.  . CHOLECYSTECTOMY    . COLONOSCOPY  2014  . Lower extremity venous Dopplers  Feb 27, 2013   No LE DVT  . MASTECTOMY    . Port a cath insertion Right 01/19/2010  . PORT-A-CATH REMOVAL     right  . PORT-A-CATH REMOVAL Right 09/03/2013   Procedure: REMOVAL PORT-A-CATH;  Surgeon: Conrad [place],  MD;  Location: [facility];  Service: Vascular;  Laterality: Right;  . TRANSTHORACIC ECHOCARDIOGRAM  03/2013; 02/2014   a) Normal LV size and function with EF 60-65%.; Cannot exclude bicuspid aortic valve with mild AS and mild AI.; b) Normal EF with normal wall motion and valve function x Mild MR. G2 DD. EF  60-65%. Tricuspid AoV  . UPPER GI ENDOSCOPY  2014   Family History  Problem Relation Age of Onset  . Cancer Father     skin  . Heart disease Father   . Heart attack Father     heart attack in his 36's  . Thyroid disease Sister   . Ovarian cancer Cousin   . Breast cancer Maternal Aunt 60  . Breast cancer Maternal Grandmother 74   Social History:  reports that she has never smoked. She has never used smokeless tobacco. She reports that she does not drink alcohol or use drugs. Allergies:  Allergies  Allergen Reactions  . Fentanyl Nausea And Vomiting and Nausea Only    vomiting Was given in PACU x3 each time patient got sick vomiting Was given in PACU x3 each time patient got sick  . Sulfa Antibiotics Hives and Other (See Comments)    Light headed, over heated   Medications Prior to Admission  Medication Sig Dispense Refill  . amantadine (SYMMETREL) 100 MG capsule Take 1 capsule (100 mg total) by mouth 3 (three) times daily. 90 capsule 12  . baclofen (LIORESAL) 10 MG tablet Take 1-2 tablets (10-20 mg total) by mouth 3 (three) times daily. 150 tablet 12  . Cholecalciferol (VITAMIN D3) 2000 UNITS capsule Take 2,000 Units by mouth daily.    . Cranberry 400 MG TABS Take 1 capsule by mouth daily. Also has magnesium, calcium and vit c    . GLUCOSAMINE-CHONDROITIN PO Take 1 tablet by mouth 4 (four) times daily. STRENGTH: 1500-1200     . ibuprofen (ADVIL,MOTRIN) 600 MG tablet TAKE 1 TABLET (600 MG TOTAL) BY MOUTH EVERY 6 (SIX) HOURS AS NEEDED. 60 tablet 2  . Interferon Beta-1a (REBIF REBIDOSE) 44 MCG/0.5ML SOAJ Inject 0.5 mLs into the skin 3 (three) times a week. (Patient taking differently: Inject 0.5 mLs into the skin 3 (three) times a week. Monday, Wednesday and Friday) 18 mL 4  . levETIRAcetam (KEPPRA) 500 MG tablet Take 1 tablet (500 mg total) by mouth 2 (two) times daily. 180 tablet 4  . lidocaine-prilocaine (EMLA) cream Apply 1 application topically once. Apply to port then cover with  saran wrap 1-2 hours before chemotherapy    . loratadine (CLARITIN) 10 MG tablet Take 10 mg by mouth daily.    . Lutein-Zeaxanthin 25-5 MG CAPS Take 1 capsule by mouth 2 (two) times daily.    . Misc Natural Products (LEG VEIN & CIRCULATION) TABS Take 1 tablet by mouth 2 (two) times daily.     . Multiple Vitamins-Minerals (MULTIVITAMIN PO) Take 1 tablet by mouth daily.    Marland Kitchen oxybutynin (DITROPAN-XL) 10 MG 24 hr tablet TAKE 2 TABLETS BY MOUTH DAILY 180 tablet 3  . prochlorperazine (COMPAZINE) 10 MG tablet Take 1 tablet (10 mg total) by mouth every 6 (six) hours as needed. 60 tablet 5  . Turmeric 500 MG CAPS Take 1 tablet by mouth daily.    . [DISCONTINUED] fentaNYL (DURAGESIC - DOSED MCG/HR) 50 MCG/HR Place 1 patch (50 mcg total) onto the skin every 3 (three) days. 10 patch 0  . [DISCONTINUED] Oxycodone HCl 10 MG TABS Take 1 tablet (10  mg total) by mouth every 6 (six) hours as needed. 120 tablet 0    Home: Home Living Family/patient expects to be discharged to:: Private residence Living Arrangements: Spouse/significant other Available Help at Discharge: Family, Available 24 hours/day Type of Home: House Home Access: Ramped entrance Home Layout: One level Bathroom Shower/Tub: Chiropodist: Standard Bathroom Accessibility: Yes Home Equipment: Environmental consultant - 2 wheels, Bedside commode, Other (comment) (transport chair)   Functional History: Prior Function Level of Independence: Needs assistance Gait / Transfers Assistance Needed: pt transfers and ambulates within the home using a RW, cannot transfer to toilet and uses a BSC, uses a wc for transporting outside of the home ADL's / Homemaking Assistance Needed: pt requires assistance w/ ADLs of bathing and dressing from spouse; pt takes seated bath in kitchen with spouse providing max A for LB bathing, min A for UB bathing; no falls reported in past 12 mo; pt reports indep with med mgt., spouse cooks/cleans; pt reports "bladder  function is inconsistent recently" Communication / Swallowing Assistance Needed: no problems w/ communicating or swallowing Comments: Husband normally at home and able to assist patient  Functional Status:  Mobility: Bed Mobility Overal bed mobility: Needs Assistance Bed Mobility: Supine to Sit Supine to sit: Mod assist General bed mobility comments: did not assess, pt up in recliner for session Transfers Overall transfer level: Needs assistance Transfers: Sit to/from Stand Sit to Stand: Mod assist, +2 physical assistance General transfer comment: deferred due to RN need to start IV Ambulation/Gait Ambulation/Gait assistance: Mod assist, +2 physical assistance Ambulation Distance (Feet): 2 Feet Assistive device: Rolling walker (2 wheeled) Gait Pattern/deviations: Step-to pattern, Decreased step length - right, Decreased step length - left, Shuffle General Gait Details: pt hyperextends R knee in stance phase due to decreased DF, takes small shuffling steps, requires +2 assistance to maintain balance and promote weight shifting and proximal control w/ ambulation  Gait velocity interpretation: <1.8 ft/sec, indicative of risk for recurrent falls    ADL: ADL Overall ADL's : Needs assistance/impaired Eating/Feeding: Set up, Sitting Grooming: Set up, Sitting Upper Body Bathing: Minimal assistance, Bed level Lower Body Bathing: Maximal assistance, Bed level Upper Body Dressing : Set up, Sitting Lower Body Dressing: Maximal assistance, Sitting/lateral leans Functional mobility during ADLs: Moderate assistance, +2 for physical assistance, Rolling walker General ADL Comments: generally Max A for LB ADL  Cognition: Cognition Overall Cognitive Status: Within Functional Limits for tasks assessed Orientation Level: Oriented X4 Cognition Arousal/Alertness: Awake/alert Behavior During Therapy: WFL for tasks assessed/performed Overall Cognitive Status: Within Functional Limits for tasks  assessed  Physical Exam: Blood pressure 118/64, pulse 73, temperature 98.7 F (37.1 C), resp. rate 20, height 5\' 4"  (1.626 m), weight 79.4 kg (175 lb), SpO2 97 %. Physical Exam  Vitals reviewed. HENT:  Head: Normocephalic.  Eyes: EOM are normal. Left eye exhibits no discharge.  Neck: Normal range of motion. Neck supple. No thyromegaly present.  Cardiovascular: Normal rate and regular rhythm.   Respiratory: Effort normal and breath sounds normal. No respiratory distress.  GI: Soft. Bowel sounds are normal. She exhibits no distension.   Skin. Chronic lymphedema lower extremities with Coban dressings over both legs. 1+ surrounding edema. Skin dry Neurological. Alert and oriented 3. Fair awareness of deficits. Functional memory and insight. Sees to all fields with confrontation testing. Tracks in all directions (?mildly dysconjugate) Upper extremity strength 5 out of 5 with decreased coordination LUE with FTN, mild left pronator drift. LLE: 2/5 HF, 1+ KE and 2+ ADF/PF, limited  by extensor tone (2/4). RLE: 1/5HF and KE and 1+/5 ADF/PF limited by more substantial extensor tone 3/4. Right heel cord contracture of at least 15 degrees. Does sense pain and light touch in all 4's but may be sl decreased in the legs. DTR's are 2+ in both LE's.  Psych: pleasant and cooperative      Medical Problem List and Plan: 1.  Decreased functional mobility secondary to MS exacerbation/UTI. Continue interferon as directed  -admit to inpatient rehab 2.  DVT Prophylaxis/Anticoagulation: Subcutaneous Lovenox. Monitor for any bleeding episodes 3. Pain Management/chronic pain: Baclofen 10 mg 3 times a day, Duragesic patch 50 g, oxycodone as needed 4. Mood: Provide emotional support 5. Neuropsych: This patient is capable of making decisions on her own behalf. 6. Skin/Wound Care/chronic lymphedema: Routine skin care bilateral lower extremities 7. Fluids/Electrolytes/Nutrition: Routine I&O with follow-up  chemistries 8. UTI/neurogenic bladder. Complete course of Keflex. Continue Ditropan. Check PVR 3  -pt reports very overactive bladder at home 9. Seizure disorder. Keppra 500 mg twice a day 10. Constipation. 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 (tension PRAFO RLE)  -aggressive ROM with therapy 12. Chronic lymphedema--continue compression wraps. Pt has Unna boots at home which husband will bring   Post Admission Physician Evaluation: 1. Functional deficits secondary  to MS exacerbation. 2. Patient is admitted to receive collaborative, interdisciplinary care between the physiatrist, rehab nursing staff, and therapy team. 3. Patient's level of medical complexity and substantial therapy needs in context of that medical necessity cannot be provided at a lesser intensity of care such as a SNF. 4. Patient has experienced substantial functional loss from his/her baseline which was documented above under the "Functional History" and "Functional Status" headings.  Judging by the patient's diagnosis, physical exam, and functional history, the patient has potential for functional progress which will result in measurable gains while on inpatient rehab.  These gains will be of substantial and practical use upon discharge  in facilitating mobility and self-care at the household level. 5. Physiatrist will provide 24 hour management of medical needs as well as oversight of the therapy plan/treatment and provide guidance as appropriate regarding the interaction of the two. 6. The Preadmission Screening has been reviewed and patient status is unchanged unless otherwise stated above. 7. 24 hour rehab nursing will assist with bladder management, bowel management, safety, skin/wound care, disease management, medication administration, pain management and patient education  and help integrate therapy concepts, techniques,education, etc. 8. PT will assess and treat  for/with: Lower extremity strength, range of motion, stamina, balance, functional mobility, safety, adaptive techniques and equipment, NMR, spasticity mgt, orthotics, family education, lymphedema rx.   Goals are: supervision. 9. OT will assess and treat for/with: ADL's, functional mobility, safety, upper extremity strength, adaptive techniques and equipment, NMR, spasticity mgt, family education, .   Goals are: supervision to min assist. Therapy may proceed with showering this patient. 10. SLP will assess and treat for/with: n/a at this time.  Goals are: n/a at this time. 11. Case Management and Social Worker will assess and treat for psychological issues and discharge planning. 12. Team conference will be held weekly to assess progress toward goals and to determine barriers to discharge. 13. Patient will receive at least 3 hours of therapy per day at least 5 days per week. 14. ELOS: 16-22 days       15. Prognosis:  excellent     Meredith Staggers, MD, [facility] 05/17/2016  ANGIULLI,DANIEL J., PA-C 05/17/2016

## 2016-05-17 NOTE — Progress Notes (Signed)
Orthopedic Tech Progress Note Patient Details:  Rebecca [name] 15-Feb-1960 [id number] Called in brace order Patient ID: Rebecca [name], female   DOB: 18-Jul-1960, 56 y.o.   MRN: [id number]   Rebecca [name] 05/17/2016, 4:53 PM

## 2016-05-17 NOTE — Care Management Note (Signed)
Inpatient Rehabilitation Center Individual Statement of Services  Patient Name:  Rebecca [name]  Date:  05/17/2016  Welcome to the [facility].  Our goal is to provide you with an individualized program based on your diagnosis and situation, designed to meet your specific needs.  With this comprehensive rehabilitation program, you will be expected to participate in at least 3 hours of rehabilitation therapies Monday-Friday, with modified therapy programming on the weekends.  Your rehabilitation program will include the following services:  Physical Therapy (PT), Occupational Therapy (OT), 24 hour per day rehabilitation nursing, Therapeutic Recreaction (TR), Neuropsychology, Case Management (Social Worker), Rehabilitation Medicine, Nutrition Services and Pharmacy Services  Weekly team conferences will be held on Wednesday to discuss your progress.  Your Social Worker will talk with you frequently to get your input and to update you on team discussions.  Team conferences with you and your family in attendance may also be held.  Expected length of stay: 12-14 days Overall anticipated outcome: supervision-min with bathing and dressing  Depending on your progress and recovery, your program may change. Your Social Worker will coordinate services and will keep you informed of any changes. Your Social Worker's name and contact numbers are listed  below.  The following services may also be recommended but are not provided by the [facility]:    [place] will be made to provide these services after discharge if needed.  Arrangements include referral to agencies that provide these services.  Your insurance has been verified to be:  Medicare & Medicaid Your primary doctor is:  Enid Derry  Pertinent information will be shared with your doctor and your insurance company.  Social  Worker:  Ovidio Kin, [place] or ([phone]  Information discussed with and copy given to patient by: [name], 05/17/2016, 3:04 PM

## 2016-05-17 NOTE — Progress Notes (Signed)
[facility] called and notified. Awaiting bed assignment for CIR. Madlyn Frankel, RN

## 2016-05-17 NOTE — Progress Notes (Signed)
Rehab admissions - I spoke with patient by phone with her husband in her room.  Patient would like to come to inpatient rehab prior to home.  Bed available and will admit to acute inpatient rehab today.  Call me for questions.  I have been given room number [phone].  #[phone]

## 2016-05-17 NOTE — Care Management (Signed)
Spoke with Karene Fry RN representative for Inpatient Acute Rehab, at [facility]. Will be able to accept Ms. Bouwman into their facility today.  Dr. Benjie Karvonen updated. Nurse Ok Edwards updated. Discharge to [facility] today per Dr. Mirna Mires RN MSN CCM Care Management [phone]

## 2016-05-18 ENCOUNTER — Encounter (HOSPITAL_COMMUNITY): Payer: Self-pay

## 2016-05-18 ENCOUNTER — Inpatient Hospital Stay (HOSPITAL_COMMUNITY): Payer: Medicare Other | Admitting: Occupational Therapy

## 2016-05-18 ENCOUNTER — Inpatient Hospital Stay (HOSPITAL_COMMUNITY): Payer: Medicare Other | Admitting: Physical Therapy

## 2016-05-18 DIAGNOSIS — G801 Spastic diplegic cerebral palsy: Secondary | ICD-10-CM

## 2016-05-18 DIAGNOSIS — D62 Acute posthemorrhagic anemia: Secondary | ICD-10-CM

## 2016-05-18 DIAGNOSIS — D696 Thrombocytopenia, unspecified: Secondary | ICD-10-CM

## 2016-05-18 DIAGNOSIS — K5901 Slow transit constipation: Secondary | ICD-10-CM

## 2016-05-18 DIAGNOSIS — N319 Neuromuscular dysfunction of bladder, unspecified: Secondary | ICD-10-CM

## 2016-05-18 DIAGNOSIS — I890 Lymphedema, not elsewhere classified: Secondary | ICD-10-CM

## 2016-05-18 DIAGNOSIS — E46 Unspecified protein-calorie malnutrition: Secondary | ICD-10-CM

## 2016-05-18 DIAGNOSIS — G40909 Epilepsy, unspecified, not intractable, without status epilepticus: Secondary | ICD-10-CM

## 2016-05-18 LAB — CBC WITH DIFFERENTIAL/PLATELET
BASOS PCT: 0 %
Basophils Absolute: 0 10*3/uL (ref 0.0–0.1)
EOS PCT: 0 %
Eosinophils Absolute: 0 10*3/uL (ref 0.0–0.7)
HCT: 34.4 % — ABNORMAL LOW (ref 36.0–46.0)
Hemoglobin: 11.7 g/dL — ABNORMAL LOW (ref 12.0–15.0)
Lymphocytes Relative: 20 %
Lymphs Abs: 1.1 10*3/uL (ref 0.7–4.0)
MCH: 30.8 pg (ref 26.0–34.0)
MCHC: 34 g/dL (ref 30.0–36.0)
MCV: 90.5 fL (ref 78.0–100.0)
MONO ABS: 0.5 10*3/uL (ref 0.1–1.0)
Monocytes Relative: 8 %
Neutro Abs: 3.9 10*3/uL (ref 1.7–7.7)
Neutrophils Relative %: 72 %
PLATELETS: 124 10*3/uL — AB (ref 150–400)
RBC: 3.8 MIL/uL — ABNORMAL LOW (ref 3.87–5.11)
RDW: 12.1 % (ref 11.5–15.5)
WBC: 5.5 10*3/uL (ref 4.0–10.5)

## 2016-05-18 LAB — COMPREHENSIVE METABOLIC PANEL
ALBUMIN: 3.3 g/dL — AB (ref 3.5–5.0)
ALT: 45 U/L (ref 14–54)
ANION GAP: 9 (ref 5–15)
AST: 35 U/L (ref 15–41)
Alkaline Phosphatase: 64 U/L (ref 38–126)
BUN: 18 mg/dL (ref 6–20)
CO2: 26 mmol/L (ref 22–32)
Calcium: 9.1 mg/dL (ref 8.9–10.3)
Chloride: 103 mmol/L (ref 101–111)
Creatinine, Ser: 0.5 mg/dL (ref 0.44–1.00)
GFR calc Af Amer: >60 mL/min (ref >60)
Glucose, Bld: 114 mg/dL — ABNORMAL HIGH (ref 65–99)
POTASSIUM: 3.6 mmol/L (ref 3.5–5.1)
Sodium: 138 mmol/L (ref 135–145)
Total Bilirubin: 0.5 mg/dL (ref 0.3–1.2)
Total Protein: 6 g/dL — ABNORMAL LOW (ref 6.5–8.1)

## 2016-05-18 MED ORDER — INTERFERON BETA-1A 44 MCG/0.5ML ~~LOC~~ SOSY
44.0000 ug | PREFILLED_SYRINGE | SUBCUTANEOUS | Status: DC
  Administered 2016-05-18 – 2016-06-01 (×7): 44 ug via SUBCUTANEOUS
  Filled 2016-05-18 (×3): qty 0.5

## 2016-05-18 NOTE — Progress Notes (Signed)
Physical Therapy Assessment and Plan  Patient Details  Name: Rebecca [name] MRN: [id number] Date of Birth: 10/18/1960  PT Diagnosis: Abnormal posture, Abnormality of gait, Coordination disorder, Edema and Muscle weakness Rehab Potential: Good ELOS: 14-16 days   Today's Date: 05/18/2016 PT Individual Time: 0815-0900 PT Individual Time Calculation (min): 45 min    Problem List:  Patient Active Problem List   Diagnosis Date Noted  . Multiple sclerosis exacerbation ([place]) 05/17/2016  . Neurogenic bladder 05/17/2016  . Multiple sclerosis ([place]) 05/14/2016  . Encounter for screening for cervical cancer  11/27/2015  . Preventative health care 11/25/2015  . History of breast cancer in female 10/28/2015  . Anemia 10/28/2015  . Thrombocytopenia ([place]) 10/28/2015  . Allergic rhinitis 10/28/2015  . IBS (irritable bowel syndrome) 10/28/2015  . Encounter for eye exam 08/25/2015  . Aortic valve disease   . Heart murmur 10/24/2014  . 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 Lymphedema of both lower extremities 10/24/2014  . History of right mastectomy 10/24/2014  . Need for immunization against influenza 10/24/2014  . Medicare annual wellness visit, subsequent 10/24/2014  . MS (multiple sclerosis) ([place]) 09/16/2014  . Hematoma complicating a procedure 03/06/2014  . 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 SOB (shortness of breath) 03/01/2013  . Bilateral lower extremity edema   . 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 Herpes zoster 06/08/2011    Past Medical History:  Past Medical History:  Diagnosis Date  . Aortic valve disease    Mild AS / AI - most recent Echo demonstrated tricuspid aortic valve.  . Bacterial endocarditis    History of .  Marland Kitchen Bilateral lower extremity edema    Noncardiac.  Chronic. LE Venous dopplers - negative for DVT.; Echocardiogram January 2016: Normal EF with normal wall motion and valve function. Only grade 1 diastolic dysfunction. EF 60-65%.  Mild MR  . Breast cancer ([place]) 12-31-13   Right breast, 12:00, 1.5 cm, T1c,N0 invasive mammary carcinoma, triple negative. --> Rx with Chemo  . Cervical stenosis of spine   . Herpes zoster   . IBS (irritable bowel syndrome)   . Lymphedema    has legs wrapped at [facility]  . Multiple sclerosis ([place]) 2001   Walks from room to room @ home; but Wheelchair when going out.  Marland Kitchen Neuromuscular disorder ([place])    MS  . Seizures ([place])    Takes Keppra  . Syncope and collapse    Past Surgical History:  Past Surgical History:  Procedure Laterality Date  . ANKLE SURGERY     Left  . ANKLE SURGERY    . ANTERIOR CERVICAL DECOMP/DISCECTOMY FUSION  11/17/2011   Procedure: ANTERIOR CERVICAL DECOMPRESSION/DISCECTOMY FUSION 2 LEVELS;  Surgeon: Erline Levine, MD;  Location: [facility];  Service: Neurosurgery;  Laterality: N/A;  Cervical Five-Six Six-Seven Anterior cervical decompression/diskectomy/fusion  . BREAST BIOPSY Right 12-31-13   invasive mammary  . BREAST SURGERY Right 02/03/2014   Right simple mastectomy with sentinel node biopsy.  . CHOLECYSTECTOMY    . COLONOSCOPY  2014  . Lower extremity venous Dopplers  Feb 27, 2013   No LE DVT  . MASTECTOMY    . Port a cath insertion Right 01/19/2010  . PORT-A-CATH REMOVAL     right  . PORT-A-CATH REMOVAL Right 09/03/2013   Procedure: REMOVAL PORT-A-CATH;  Surgeon: Conrad Huntleigh, MD;  Location: [facility];  Service: Vascular;  Laterality: Right;  . TRANSTHORACIC ECHOCARDIOGRAM  03/2013; 02/2014   a) Normal LV size  and function with EF 60-65%.; Cannot exclude bicuspid aortic valve with mild AS and mild AI.; b) Normal EF with normal wall motion and valve function x Mild MR. G2 DD. EF 60-65%. Tricuspid AoV  . UPPER GI ENDOSCOPY  2014    Assessment & Plan Clinical Impression: 56 year old right handed female with history of chronic pain, lymphedema of both lower extremities, aortic valve disease, right breast cancer November 2015 with chemotherapy, seizure disorder,  multiple sclerosis diagnosed 2001 maintained on interferon. Per chart review patient lives with spouse. One level home with ramped entrance. Patient transfers and ambulates within the home using a rolling walker. Presented [facility]/14/2016 to [name] with bilateral lower extremity weakness over the past 4-5 days. Denied any recent fall. Patient had denied any recent flareups or exacerbations of her MS in the recent past. MRI of the brain, cervical and thoracic spine reviewed by neurology consistent with MS no suggestion of active disease. Placed on intravenous Solu-Medrol 3 days and completed. Suspect UTI initially placed on intravenous Rocephin and change to Keflex. Subcutaneous Lovenox for DVT prophylaxis. Maintain on a regular diet. Physical occupational therapy evaluations completed. Patient transferred to CIR on 05/17/2016.   Patient currently requires max with mobility secondary to muscle weakness, muscle joint tightness and muscle paralysis, decreased cardiorespiratoy endurance, impaired timing and sequencing, abnormal tone, unbalanced muscle activation and decreased coordination and decreased standing balance, decreased postural control and decreased balance strategies.  Prior to hospitalization, patient was modified independent  with mobility and lived with Spouse in a House home.  Home access is  Ramped entrance.  Patient will benefit from skilled PT intervention to maximize safe functional mobility, minimize fall risk and decrease caregiver burden for planned discharge home with 24 hour supervision.  Anticipate patient will benefit from follow up [place] at discharge.  PT - End of Session Activity Tolerance: Decreased this session;Tolerates < 10 min activity with changes in vital signs Endurance Deficit: Yes PT Assessment Rehab Potential (ACUTE/IP ONLY): Good PT Patient demonstrates impairments in the following area(s): Balance;Edema;Endurance;Motor;Nutrition;Pain;Skin Integrity;Safety PT Transfers  Functional Problem(s): Bed Mobility;Bed to Chair;Car;Furniture PT Locomotion Functional Problem(s): Ambulation;Wheelchair Mobility;Stairs PT Plan PT Intensity: Minimum of 1-2 x/day ,45 to 90 minutes PT Frequency: 5 out of 7 days PT Duration Estimated Length of Stay: 14-16 days PT Treatment/Interventions: Ambulation/gait training;Balance/vestibular training;Community reintegration;Discharge planning;Disease management/prevention;DME/adaptive equipment instruction;Functional electrical stimulation;Functional mobility training;Neuromuscular re-education;Pain management;Patient/family education;Psychosocial support;Skin care/wound management;Splinting/orthotics;Stair training;Therapeutic Activities;UE/LE Strength taining/ROM;Therapeutic Exercise;UE/LE Coordination activities;Wheelchair propulsion/positioning PT Transfers Anticipated Outcome(s): supervision PT Locomotion Anticipated Outcome(s): supervision household ambulator PT Recommendation Recommendations for Other Services: Therapeutic Recreation consult Therapeutic Recreation Interventions: Stress management;Pet therapy Follow Up Recommendations: Home health PT;24 hour supervision/assistance Patient destination: Home Equipment Recommended: None recommended by PT Equipment Details: owns transport chair and RW  Skilled Therapeutic Intervention Skilled therapeutic intervention initiated after completion of evaluation. Discussed with patient falls risk, safety within room, focus of therapy during stay, possible length of stay, goals, and follow-up therapy. Patient requires mod A for bed mobility using hospital bed functions, min A sit <> stand x 3 from EOB using RW, min A stand pivot transfer using RW with trunk flexed forward over RW and greatly increased time, and min A wheelchair propulsion short distance using BUE with cues for efficient technique. Patient left sitting in wheelchair with all needs in reach, handoff to OT.   PT  Evaluation Precautions/Restrictions Precautions Precautions: Fall Precaution Comments: R PF contracture Restrictions Weight Bearing Restrictions: No Pain Pain Assessment Pain Assessment: No/denies pain Home Living/Prior Functioning Home Living  Available Help at Discharge: Family;Available 24 hours/day Type of Home: House Home Access: Ramped entrance Home Layout: One level Bathroom Shower/Tub: Chiropodist: Standard Bathroom Accessibility: No  Lives With: Spouse Prior Function Level of Independence: Independent with transfers;Requires assistive device for independence;Independent with gait;Needs assistance with ADLs;Needs assistance with homemaking  Able to Take Stairs?: No (not in a few years) Driving: No (since [id number]) Vocation: On disability Vocation Requirements: worked in Conservation officer, nature until 2008 Leisure: Hobbies-yes (Comment) Comments: watch TV, read a little Vision/Perception    No change from baseline Cognition Overall Cognitive Status: Within Functional Limits for tasks assessed Arousal/Alertness: Awake/alert Attention: Sustained Sustained Attention: Appears intact Memory: Appears intact Awareness: Appears intact Problem Solving: Appears intact Safety/Judgment: Appears intact Comments: Cognition intact for OT eval.  Will continue to assess further in treatment.  Sensation Sensation Light Touch: Appears Intact Stereognosis: Appears Intact Hot/Cold: Appears Intact Proprioception: Appears Intact Additional Comments: Sensation intact in BUEs Coordination Gross Motor Movements are Fluid and Coordinated: No Fine Motor Movements are Fluid and Coordinated: Yes Motor  Motor Motor: Abnormal postural alignment and control;Abnormal tone Motor - Skilled Clinical Observations: Increased tone in the left knee extensors.  BLE weakness for sit to stand, standing balance during ADL.  Mobility Bed Mobility Bed Mobility: Supine to Sit Supine to Sit: With  rails;HOB elevated;3: Mod assist Transfers Transfers: Yes Sit to Stand: 4: Min assist;From bed;With upper extremity assist Sit to Stand Details: Manual facilitation for weight shifting;Manual facilitation for placement Stand to Sit: To chair/3-in-1;With upper extremity assist;4: Min assist Stand Pivot Transfers: 4: Min assist Locomotion  Ambulation Ambulation: No Stairs / Additional Locomotion Stairs: No Architect: Yes Wheelchair Assistance: 4: Energy manager: Both upper extremities Wheelchair Parts Management: Needs assistance Distance: 42f  Trunk/Postural Assessment  Cervical Assessment Cervical Assessment: Within FScientist, physiologicalAssessment: Exceptions to WFL (slight kyphosis sitting in wheelchair) Lumbar Assessment Lumbar Assessment: Exceptions to [facility](maintains posterior pelvic tilt) Postural Control Postural Control: Deficits on evaluation Righting Reactions: impaired Postural Limitations: R PF contracture  Balance Balance Balance Assessed: Yes Static Sitting Balance Static Sitting - Level of Assistance: 5: Stand by assistance Dynamic Sitting Balance Dynamic Sitting - Balance Support: During functional activity Dynamic Sitting - Level of Assistance: 4: Min aInsurance risk surveyorStanding - Balance Support: Bilateral upper extremity supported;During functional activity Static Standing - Level of Assistance: 4: Min assist Dynamic Standing Balance Dynamic Standing - Balance Support: During functional activity Dynamic Standing - Level of Assistance: 2: Max assist Extremity Assessment  RUE Assessment RUE Assessment:  (AROM WFLs for functional ADL, strength not formally assessed) LUE Assessment LUE Assessment: Within Functional Limits (AROM WFLs for ADL but not formally assessed. ) RLE Assessment RLE Assessment: Exceptions to [facility] Hospital[facility]LE Strength RLE Overall Strength:  Deficits RLE Overall Strength Comments: grossly 1/5, ankle PF contracture with no active ankle DF noted RLE Tone RLE Tone: Severe RLE Tone Comments: extensor tone 3/4 LLE Assessment LLE Assessment: Exceptions to WFL LLE Strength LLE Overall Strength: Deficits LLE Overall Strength Comments: grossly 2-/5 LLE Tone LLE Tone: Moderate LLE Tone Comments: extensor tone 2/4   See Function Navigator for Current Functional Status.   Refer to Care Plan for Long Term Goals  Recommendations for other services: Therapeutic Recreation  Pet therapy and Stress management  Discharge Criteria: Patient will be discharged from PT if patient refuses treatment 3 consecutive times without medical reason, if treatment goals not met, if there is a change in  medical status, if patient makes no progress towards goals or if patient is discharged from hospital.  The above assessment, treatment plan, treatment alternatives and goals were discussed and mutually agreed upon: by patient  Jade Burright, Murray Hodgkins 05/18/2016, 12:54 PM

## 2016-05-18 NOTE — Progress Notes (Signed)
Physical Therapy Session Note  Patient Details  Name: Rebecca [name] MRN: [id number] Date of Birth: November 10, 1960  Today's Date: 05/18/2016 PT Individual Time: 1300-1400 and 1505-1530 PT Individual Time Calculation (min): 60 min and 25 min  Short Term Goals: Week 1:  PT Short Term Goal 1 (Week 1): Patient will perform bed mobility with consistent min A using hospital bed functions. PT Short Term Goal 2 (Week 1): Patient will perform functional transfers with consistent min A.  PT Short Term Goal 3 (Week 1): Patient will propel wheelchair using BUE x 75 ft with supervision.  PT Short Term Goal 4 (Week 1): Patient will ambulate 15 ft using RW with min A.   Skilled Therapeutic Interventions/Progress Updates:  Treatment 1: Patient in wheelchair upon arrival with aunt departing. Patient instructed in sit > stand from wheelchair with 2 attempts as initial attempt with RUE on RW and LUE on arm rest unsuccessful. Pushing up with BUE from wheelchair, patient transferred sit > stand with max A using RW and increased time and effort with patient unable to achieve full upright posture. Patient required max A to maintain standing balance and unable to advance either LE to initiate ambulation. Transitioned to standing frame for RLE > LLE weightbearing for decreasing tone and achieving increased R ankle dorsiflexion due to contracture/extensor tone. Patient tolerated standing in standing frame x 15 min while engaging in card game with R ankle lacking approx 5 deg ankle DF from neutral at end of 15 min. Patient requesting to return to bed at end of session. With bariatric Stedy, patient transferred sit <> stand using BUE to pull up from wheelchair to bed with steady assist. Transferred sit > supine with max A and total A to reposition in bed in trendelenberg position. Patient left semi reclined with all needs in reach and bed alarm on.   Treatment 2: Patient awake in bed upon arrival, no c/o pain. Patient instructed in  supine > sit with use of hospital bed functions and introduction of leg lifter to increase independence with mod A to bring BLE off bed and max verbal cues for elevating trunk using BUE. Sit <> stand from EOB with min A using RW. Patient ambulated 15 ft using RW in room with min-mod A overall and greatly increased time. Patient's gait characterized by RLE circumduction due to absent hip/knee/ankle DF and trunk forward flexed over RW. Gait terminated when patient unable to advance either LE and wheelchair brought up behind her. Patient performed stand pivot transfer using RW to return to bed with max A for sit > stand form wheelchair and min A to turn with max verbal cues for sequencing backing up to bed and hand placement. Patient transferred sit > supine with max A and repositioned in bed with max-total A in trendelenberg as well as rolling to R and L using rails with min A. Patient left semi reclined in bed with all needs in reach and bed alarm on.   Therapy Documentation Precautions:  Precautions Precautions: Fall Precaution Comments: R PF contracture Restrictions Weight Bearing Restrictions: No Pain: Pain Assessment Pain Assessment: No/denies pain  See Function Navigator for Current Functional Status.   Therapy/Group: Individual Therapy  Arynn Armand, Murray Hodgkins 05/18/2016, 2:53 PM

## 2016-05-18 NOTE — Evaluation (Signed)
Occupational Therapy Assessment and Plan  Patient Details  Name: Rebecca [name] MRN: [id number] Date of Birth: [birth year]  OT Diagnosis: abnormal posture and muscle weakness (generalized) Rehab Potential: Rehab Potential (ACUTE ONLY): Good ELOS: 12-14 days   Today's Date: 05/18/2016 OT Individual Time: 8850-2774 OT Individual Time Calculation (min): 64 min     Problem List:  Patient Active Problem List   Diagnosis Date Noted  . Multiple sclerosis exacerbation ([place]) 05/17/2016  . Neurogenic bladder 05/17/2016  . Multiple sclerosis ([place]) 05/14/2016  . Encounter for screening for cervical cancer  11/27/2015  . Preventative health care 11/25/2015  . History of breast cancer in female 10/28/2015  . Anemia 10/28/2015  . Thrombocytopenia ([place]) 10/28/2015  . Allergic rhinitis 10/28/2015  . IBS (irritable bowel syndrome) 10/28/2015  . Encounter for eye exam 08/25/2015  . Aortic valve disease   . Heart murmur 10/24/2014  . 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 Lymphedema of both lower extremities 10/24/2014  . History of right mastectomy 10/24/2014  . Need for immunization against influenza 10/24/2014  . Medicare annual wellness visit, subsequent 10/24/2014  . MS (multiple sclerosis) ([place]) 09/16/2014  . Hematoma complicating a procedure 03/06/2014  . 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 SOB (shortness of breath) 03/01/2013  . Bilateral lower extremity edema   . 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 Herpes zoster 06/08/2011    Past Medical History:  Past Medical History:  Diagnosis Date  . Aortic valve disease    Mild AS / AI - most recent Echo demonstrated tricuspid aortic valve.  . Bacterial endocarditis    History of .  Marland Kitchen Bilateral lower extremity edema    Noncardiac.  Chronic. LE Venous dopplers - negative for DVT.; Echocardiogram January 2016: Normal EF with normal wall motion and valve function. Only grade 1 diastolic dysfunction. EF 60-65%.  Mild MR  . Breast cancer ([place]) 12-31-13   Right breast, 12:00, 1.5 cm, T1c,N0 invasive mammary carcinoma, triple negative. --> Rx with Chemo  . Cervical stenosis of spine   . Herpes zoster   . IBS (irritable bowel syndrome)   . Lymphedema    has legs wrapped at [facility]  . Multiple sclerosis ([place]) 2001   Walks from room to room @ home; but Wheelchair when going out.  Marland Kitchen Neuromuscular disorder ([place])    MS  . Seizures ([place])    Takes Keppra  . Syncope and collapse    Past Surgical History:  Past Surgical History:  Procedure Laterality Date  . ANKLE SURGERY     Left  . ANKLE SURGERY    . ANTERIOR CERVICAL DECOMP/DISCECTOMY FUSION  11/17/2011   Procedure: ANTERIOR CERVICAL DECOMPRESSION/DISCECTOMY FUSION 2 LEVELS;  Surgeon: Erline Levine, MD;  Location: [facility];  Service: Neurosurgery;  Laterality: N/A;  Cervical Five-Six Six-Seven Anterior cervical decompression/diskectomy/fusion  . BREAST BIOPSY Right 12-31-13   invasive mammary  . BREAST SURGERY Right 02/03/2014   Right simple mastectomy with sentinel node biopsy.  . CHOLECYSTECTOMY    . COLONOSCOPY  2014  . Lower extremity venous Dopplers  Feb 27, 2013   No LE DVT  . MASTECTOMY    . Port a cath insertion Right 01/19/2010  . PORT-A-CATH REMOVAL     right  . PORT-A-CATH REMOVAL Right 09/03/2013   Procedure: REMOVAL PORT-A-CATH;  Surgeon: Conrad Hillsdale, MD;  Location: [facility];  Service: Vascular;  Laterality: Right;  . TRANSTHORACIC ECHOCARDIOGRAM  03/2013; 02/2014   a) Normal LV size  and function with EF 60-65%.; Cannot exclude bicuspid aortic valve with mild AS and mild AI.; b) Normal EF with normal wall motion and valve function x Mild MR. G2 DD. EF 60-65%. Tricuspid AoV  . UPPER GI ENDOSCOPY  2014    Assessment & Plan Clinical Impression: Patient is a 56 y.o. year old female with recent admission to the hospital on 05/14/2016 to [facility] with bilateral lower extremity weakness over the past 4-5 days. Denied any recent fall. Patient  had denied any recent flareups or exacerbations of her MS in the recent past. MRI of the brain, cervical and thoracic spine reviewed by neurology consistent with MS no suggestion of active disease. Placed on intravenous Solu-Medrol 3 days and completed.  Patient transferred to CIR on 05/17/2016 .    Patient currently requires mod with basic self-care skills secondary to muscle weakness, abnormal tone, unbalanced muscle activation and decreased coordination and decreased standing balance, decreased postural control and decreased balance strategies.  Prior to hospitalization, patient could complete ADLs with min.  Patient will benefit from skilled intervention to decrease level of assist with basic self-care skills and increase independence with basic self-care skills prior to discharge home with care partner.  Anticipate patient will require 24 hour supervision and follow up home health.  OT - End of Session Activity Tolerance: Tolerates 30+ min activity with multiple rests Endurance Deficit: Yes OT Assessment Rehab Potential (ACUTE ONLY): Good OT Patient demonstrates impairments in the following area(s): Balance;Endurance;Motor OT Basic ADL's Functional Problem(s): Grooming;Bathing;Dressing;Toileting OT Transfers Functional Problem(s): Toilet OT Plan OT Intensity: Minimum of 1-2 x/day, 45 to 90 minutes OT Frequency: 5 out of 7 days OT Duration/Estimated Length of Stay: 12-14 days OT Treatment/Interventions: Teacher, English as a foreign language;Discharge planning;Community reintegration;Disease mangement/prevention;Functional mobility training;Self Care/advanced ADL retraining;Therapeutic Exercise;Therapeutic Activities;UE/LE Coordination activities;Neuromuscular re-education;UE/LE Strength taining/ROM OT Self Feeding Anticipated Outcome(s): independent OT Basic Self-Care Anticipated Outcome(s): supervision to min assist OT Toileting Anticipated Outcome(s):  supervision OT Bathroom Transfers Anticipated Outcome(s): supervision OT Recommendation Patient destination: Home Follow Up Recommendations: Home health OT Equipment Recommended: To be determined   Skilled Therapeutic Intervention Pt completed selfcare retraining sit to stand at the sink this session.  Decreased ability to complete sit to stand for LB selfcare, currently requiring mod assist for sit to stand with decreased pelvic and hip extension noted.  LLE also in hyperextension of the knee.  Once standing increased trunk flexion posture also noted with decreased ability to achieve upright standing.  She demonstrated increased fear when attempting to release her RUE from the sink to wash her front peri area with posterior LOB noted.  Decreased ability to reach either LE for dressing tasks as well will benefit from AE trial.  Pt with no clothing on eval so donned hospital gowns.  Pt left in wheelchair with NT in room to finish assisting with donning new gripper socks.    OT Evaluation Precautions/Restrictions  Precautions Precautions: Fall Precaution Comments: right LE planter flexion contracture Restrictions Weight Bearing Restrictions: No  Pain Pain Assessment Pain Assessment: No/denies pain Home Living/Prior Functioning Home Living Living Arrangements: Spouse/significant other Available Help at Discharge: Family, Available 24 hours/day Type of Home: House Home Access: Ramped entrance Home Layout: One level Bathroom Shower/Tub: Tub/shower unit (Pt was sponge bathing only ) Bathroom Toilet: Standard Bathroom Accessibility: No  Lives With: Spouse IADL History Homemaking Responsibilities: No Current License: No Occupation: On disability Prior Function Level of Independence: Independent with transfers, Requires assistive device for independence, Needs assistance with ADLs, Needs assistance  with homemaking  Able to Take Stairs?: No (not in a few years) Driving: No Vocation: On  disability Vocation Requirements: worked in Conservation officer, [employer] until 2008 Leisure: Hobbies-yes (Comment) Comments: watch TV, read a little ADL  See Function Section of chart for details  Vision/Perception  Vision- History Baseline Vision/History: Wears glasses Wears Glasses: At all times Patient Visual Report: No change from baseline Vision- Assessment Vision Assessment?: No apparent visual deficits  Cognition Overall Cognitive Status: Within Functional Limits for tasks assessed Arousal/Alertness: Awake/alert Orientation Level: Person;Place;Situation Person: Oriented Place: Oriented Situation: Oriented Year: 2018 Month: April Day of Week: Correct Memory: Appears intact Immediate Memory Recall: Sock;Blue;Bed Memory Recall: Sock;Blue;Bed Memory Recall Sock: Without Cue Memory Recall Blue: Without Cue Memory Recall Bed: Without Cue Attention: Sustained Sustained Attention: Appears intact Awareness: Appears intact Problem Solving: Appears intact Safety/Judgment: Appears intact Comments: Cognition intact for OT eval.  Will continue to assess further in treatment.  Sensation Sensation Light Touch: Appears Intact Stereognosis: Appears Intact Hot/Cold: Appears Intact Proprioception: Appears Intact Additional Comments: Sensation intact in BUEs Coordination Gross Motor Movements are Fluid and Coordinated: Yes (In UEs with functional use during selfcare tasks.  Will further assess in treatemen) Fine Motor Movements are Fluid and Coordinated: Yes Motor  Motor Motor: Abnormal postural alignment and control;Abnormal tone Motor - Skilled Clinical Observations: Increased tone in the left knee extensors.  BLE weakness for sit to stand, standing balance during ADL. Mobility  Transfers Transfers: Sit to Stand;Stand to Sit Sit to Stand: 3: Mod assist;With armrests;From chair/3-in-1;With upper extremity assist Sit to Stand Details: Manual facilitation for weight shifting;Manual facilitation  for placement Stand to Sit: 3: Mod assist;With upper extremity assist;With armrests  Trunk/Postural Assessment  Cervical Assessment Cervical Assessment: Within Functional Limits Thoracic Assessment Thoracic Assessment: Exceptions to [facility] (slight kyphosis sitting in wheelchair) Lumbar Assessment Lumbar Assessment: Exceptions to [facility] (maintains posterior pelvic tilt) Postural Control Postural Control: Deficits on evaluation Postural Limitations: Maintains forward trunk flexion in standing with decreased bilateral pelvic extension and right knee hyperextension.    Balance Balance Balance Assessed: Yes Static Sitting Balance Static Sitting - Level of Assistance: 5: Stand by assistance Dynamic Sitting Balance Dynamic Sitting - Level of Assistance: 5: Stand by assistance Static Standing Balance Static Standing - Balance Support: During functional activity Static Standing - Level of Assistance: 3: Mod assist Dynamic Standing Balance Dynamic Standing - Balance Support: During functional activity Dynamic Standing - Level of Assistance: 2: Max assist Extremity/Trunk Assessment RUE Assessment RUE Assessment:  (AROM WFLs for functional ADL, strength not formally assessed) LUE Assessment LUE Assessment: Within Functional Limits (AROM WFLs for ADL but not formally assessed. )   See Function Navigator for Current Functional Status.   Refer to Care Plan for Long Term Goals  Recommendations for other services: None    Discharge Criteria: Patient will be discharged from OT if patient refuses treatment 3 consecutive times without medical reason, if treatment goals not met, if there is a change in medical status, if patient makes no progress towards goals or if patient is discharged from hospital.  The above assessment, treatment plan, treatment alternatives and goals were discussed and mutually agreed upon: by patient  Doneshia Hill OTR/L 05/18/2016, 12:50 PM

## 2016-05-18 NOTE — IPOC Note (Signed)
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[facility])   Lymphedema   Hypoalbuminemia due to protein-calorie malnutrition ([facility])   Acute blood loss anemia     Functional Problem List: Nursing Bladder, Bowel, Endurance, Medication Management, Motor, Nutrition, Safety, Skin Integrity  PT Balance, Edema, Endurance, Motor, Nutrition, Pain, Skin Integrity, Safety  OT Balance, Endurance, Motor  SLP    TR         Basic ADL's: OT Grooming, Bathing, Dressing, Toileting     Advanced  ADL's: OT       Transfers: PT Bed Mobility, Bed to Chair, Car, Chief Operating Officer: PT Ambulation, Emergency planning/management officer, Stairs     Additional Impairments: OT    SLP        TR      Anticipated Outcomes Item Anticipated Outcome  Self Feeding independent  Swallowing      Basic self-care  supervision to min assist  Toileting  supervision   Bathroom Transfers supervision  Bowel/Bladder  Min assist  Transfers  supervision  Locomotion  supervision household ambulator  Communication     Cognition     Pain  3 or less  Safety/Judgment  Mod assist   Therapy Plan: PT Intensity: Minimum of 1-2 x/day ,45 to 90 minutes PT Frequency: 5 out of 7 days PT Duration Estimated Length of Stay: 14-16 days OT Intensity: Minimum of 1-2 x/day, 45 to 90 minutes OT Frequency: 5 out of 7 days OT Duration/Estimated Length of Stay: 12-14 days         Team Interventions: Nursing Interventions Patient/Family Education, Pain Management, Bladder Management, Bowel Management, Disease Management/Prevention, Medication Management, Discharge Planning, Psychosocial Support  PT interventions Ambulation/gait training, Training and development officer, Community  reintegration, Discharge planning, Disease management/prevention, DME/adaptive equipment instruction, Functional electrical stimulation, Functional mobility training, Neuromuscular re-education, Pain management, Patient/family education, Psychosocial support, Skin care/wound management, Splinting/orthotics, Stair training, Therapeutic Activities, UE/LE Strength taining/ROM, Therapeutic Exercise, UE/LE Coordination activities, Wheelchair propulsion/positioning  OT Interventions Training and development officer, DME/adaptive equipment instruction, Discharge planning, Community reintegration, Disease mangement/prevention, Functional mobility training, Self Care/advanced ADL retraining, Therapeutic Exercise, Therapeutic Activities, UE/LE Coordination activities, Neuromuscular re-education, UE/LE Strength taining/ROM  SLP Interventions    TR Interventions    SW/CM Interventions Discharge Planning, Psychosocial Support, Patient/Family Education    Team Discharge Planning: Destination: PT-Home ,OT- Home , SLP-  Projected Follow-up: PT-Home health PT, 24 hour supervision/assistance, OT-  Home health OT, SLP-  Projected Equipment Needs: PT-None recommended by PT, OT- To be determined, SLP-  Equipment Details: PT-owns transport chair and RW, OT-  Patient/family involved in discharge planning: PT- Patient,  OT-Patient, SLP-   MD ELOS: 10-14 days. Medical Rehab Prognosis:  Good Assessment: 56 year old right handed female with history of chronic pain, lymphedema of both lower extremities, aortic valve disease, right breast cancer November 2015 with chemotherapy, seizure disorder, multiple sclerosis diagnosed 2001 maintained on interferon. Per chart review patient lives with spouse. One level home with ramped entrance. Patient transfers and ambulates within the home using a rolling walker. Presented 05/14/2016 to [facility] with bilateral lower extremity weakness over the past 4-5 days. Denied any recent fall. Patient had  denied any recent flareups or exacerbations of her MS in the recent past. MRI of the brain, cervical and thoracic spine reviewed  by neurology consistent with MS no suggestion of active disease. Placed on intravenous Solu-Medrol 3 days and completed. Suspect UTI initially placed on intravenous Rocephin and change to Keflex. Pt with resulting functional deficits with mobility and self-care.  Will set goals for Supervision for most tasks with PT/OT and Mod A for safety.  See Team Conference Notes for weekly updates to the plan of care

## 2016-05-18 NOTE — Progress Notes (Signed)
Meredith Staggers, MD Physician Signed Physical Medicine and Rehabilitation  PMR Pre-admission Date of Service: 05/17/2016 11:32 AM  Related encounter: ED to Hosp-Admission (Discharged) from 05/14/2016 in [place] (1C)       [] Hide copied text   [employer] PMR Admission Coordinator Pre-Admission Assessment  Patient: Rebecca [name] is an 56 y.o., female MRN: [id number] DOB: September 16, 1960 Height: 5\' 4"  (162.6 cm) Weight: 79.4 kg (175 lb)  Insurance Information HMO: No   PPO:       PCP:       IPA:       80/20:       OTHER:   PRIMARY:  Medicare A/B      Policy#:  [id number]      Subscriber:  Holley Dexter CM Name:        Phone#:       Fax#:   Pre-Cert#:        Employer:  Disabled Benefits:  Phone #:       Name: Checked in [employer]. Date:  10/08/09     Deduct:  $1340      Out of Pocket Max: none      Life Max: unlimited CIR: 100%      SNF: 100 days Outpatient:  80%     Co-Pay: 20% Home Health: 100%      Co-Pay: none DME: 80%     Co-Pay: 20% Providers: patient's choice  SECONDARY:  Medicaid of [place]      Policy#: [id number]      Subscriber: patient CM Name:        Phone#:       Fax#:   Pre-Cert#:        Employer:  disabled Benefits:  Phone #: [phone]     Name: Automated Eff. Date:  Eligible 05/17/16 with coverage code [id number]     Deduct:        Out of Pocket Max:        Life Max:   CIR:        SNF:   Outpatient:       Co-Pay:   Home Health:        Co-Pay:   DME:       Co-Pay:    Emergency Contact Information        Contact Information    Name Relation Home Work Mobile   Feeley,Don Spouse [phone]  [phone]   Rolland Bimler [phone]        Current Medical History  Patient Admitting Diagnosis:  MS exacerbation  History of Present Illness:  A 56 year old right handed female with history of chronic pain, lymphedema of both lower extremities, aortic valve disease, right breast cancer November 2015 with chemotherapy,  seizure disorder, multiple sclerosis diagnosed 2001 maintained on interferon. Per chart review patient lives with spouse. One level home with ramped entrance. Patient transfers and ambulates within the home using a rolling walker. Presented 05/14/2016 to [facility] with bilateral lower extremity weakness over the past 4-5 days. Denied any recent fall. Patient had denied any recent flareups or exacerbations of her MS in the recent past. MRI of the brain, cervical and thoracic spine reviewed by neurology consistent with MS no suggestion of active disease. Placed on intravenous Solu-Medrol 3 days. Suspect UTI initially placed on intravenous Rocephin and change to Keflex. Subcutaneous Lovenox for DVT prophylaxis. Maintain on a regular diet. Physical occupational therapy evaluations completed. Patient to be admitted for comprehensive inpatient rehabilitation program.  Patient's medical record from  [facility] has been reviewed by the rehabilitation admission coordinator and physician.  Past Medical History      Past Medical History:  Diagnosis Date  . Aortic valve disease    Mild AS / AI - most recent Echo demonstrated tricuspid aortic valve.  . Bacterial endocarditis    History of .  Marland Kitchen Bilateral lower extremity edema    Noncardiac.  Chronic. LE Venous dopplers - negative for DVT.; Echocardiogram January 2016: Normal EF with normal wall motion and valve function. Only grade 1 diastolic dysfunction. EF 60-65%. Mild MR  . Breast cancer ([place]) 12-31-13   Right breast, 12:00, 1.5 cm, T1c,N0 invasive mammary carcinoma, triple negative. --> Rx with Chemo  . Cervical stenosis of spine   . Herpes zoster   . IBS (irritable bowel syndrome)   . Lymphedema    has legs [facility] at [facility]  . Multiple sclerosis ([place]) 2001   Walks from room to room @ home; but Wheelchair when going out.  Marland Kitchen Neuromuscular disorder ([place])    MS  . Seizures ([place])    Takes Keppra  . Syncope and  collapse     Family History   family history includes Breast cancer (age of onset: 27) in her maternal aunt; Breast cancer (age of onset: 58) in her maternal grandmother; Cancer in her father; Heart attack in her father; Heart disease in her father; Ovarian cancer in her cousin; Thyroid disease in her sister.  Prior Rehab/Hospitalizations Has the patient had major surgery during 100 days prior to admission? No.  Patient had HH therapies about a yr ago for weakness.     Current Medications  See MAR from [facility]  Patients Current Diet:   Heart diet, thin liquids  Precautions / Restrictions Precautions Precautions: Fall Restrictions Weight Bearing Restrictions: No   Has the patient had 2 or more falls or a fall with injury in the past year?No  Prior Activity Level Household: Mostly homebound, went to MD appointments 1-2 X a month  Prior Functional Level Self Care: Did the patient need help bathing, dressing, using the toilet or eating?  Needed some help  Indoor Mobility: Did the patient need assistance with walking from room to room (with or without device)? Independent  Stairs: Did the patient need assistance with internal or external stairs (with or without device)? Dependent  Functional Cognition: Did the patient need help planning regular tasks such as shopping or remembering to take medications? [place] Devices/Equipment: (S) Environmental consultant (specify type), Transport planner, Wheelchair (rolling and skis in the back) Home Equipment: Environmental consultant - 2 wheels, Bedside commode, Other (comment) (transport chair)  Prior Device Use: Indicate devices/aids used by the patient prior to current illness, exacerbation or injury? Walker and 3 prong cane and a transport chair.   Prior Functional Level Current Functional Level  Bed Mobility  Independent  Mod assist   Transfers  Independent  Mod  assist   Mobility - Walk/Wheelchair  Independent  Mod assist (Ambulated 2 feet RW)   Upper Body Dressing  Independent  Other (Set up)   Lower Body Dressing  Min assist  Max assist   Grooming  Independent  Other (Set up)   Eating/Drinking  Independent  Other (Set up)   Toilet Transfer  Min assist  Max assist   Bladder Continence   Used BSC at home  Incontinence at times   Bowel Management  WDL  daily  Last BM 05/17/16   Stair Climbing  Dependent Other (Not tried)   Communication  Intact and verbal   Verbal and intact   Memory  Intact  WDL   Cooking/Meal Prep  Husband does this      Housework  Husband does this    Money Management  Independent    Driving  Not since 1497     Special needs/care consideration BiPAP/CPAP No CPM No Continuous Drip IV No  Dialysis No       Life Vest No Oxygen No Special Bed No Trach Size No Wound Vac (area) No     Skin Dry skin                              Bowel mgmt: Last BM 05/17/16 Bladder mgmt: Using BSC and bedpan, with incontinence Diabetic mgmt No  Previous Home Environment Living Arrangements: Spouse/significant other Available Help at Discharge: Family, Available 24 hours/day Type of Home: House Home Layout: One level Home Access: Ramped entrance Bathroom Shower/Tub: Optometrist: Yes Home Care Services: No  Discharge Living Setting Plans for Discharge Living Setting: Patient's home, House, Lives with (comment) (Lives with husband.) Type of Home at Discharge: House Discharge Home Layout: One level Discharge Home Access: [place] entrance Discharge Bathroom Shower/Tub: Tub/shower unit Discharge Bathroom Toilet: Standard Discharge Bathroom Accessibility: Yes How Accessible: Accessible via walker Does the patient have any problems obtaining your medications?: No  Social/Family/Support  Systems Patient Roles: Spouse (Has a husband.) Contact Information: Vora Clover - spouse Anticipated Caregiver: husband Anticipated Caregiver's Contact Information: [name] - husband - [phone] Ability/Limitations of Caregiver:  Husband stays at home, is not working, assists patient Caregiver Availability: 24/7 Discharge Plan Discussed with Primary Caregiver: Yes Is Caregiver In Agreement with Plan?: Yes Does Caregiver/Family have Issues with Lodging/Transportation while Pt is in Rehab?: Yes (Car is old and may have trouble coming to [facility].)  Goals/Additional Needs Patient/Family Goal for Rehab: PT/OT supervision to min assist goals Expected length of stay: 7-10 days Cultural Considerations: None Dietary Needs: Heart diet, thin liquids Equipment Needs: TBD Pt/Family Agrees to Admission and willing to participate: Yes (Spoke with patient and spouse in patient's room.) Program Orientation Provided & Reviewed with Pt/Caregiver Including Roles  & Responsibilities: Yes  Patient Condition:  Rehabilitation Physician determined and documented that the patient's condition is appropriate for intensive rehabilitative care in an inpatient rehabilitation facility. Will admit to inpatient rehab today.  Preadmission Screen Completed By:  Retta Diones, 05/17/2016 11:50 AM ______________________________________________________________________   Discussed status with Dr. Naaman Plummer on 05/17/16  at 75 and received telephone approval for admission today.    Assessment/Plan: 1. Diagnosis: MS exacerbation 2. Does the need for close, 24 hr/day  Medical supervision in concert with the patient's rehab needs make it unreasonable for this patient to be served in a less intensive setting? Yes 3. Co-Morbidities requiring supervision/potential complications: AV disease, IBS seizure disorder 4. Due to bladder management, bowel management, safety, skin/wound care, disease management, medication administration,  pain management and patient education, does the patient require 24 hr/day rehab nursing? Yes 5. Does the patient require coordinated care of a physician, rehab nurse, PT (1-2 hrs/day, 5 days/week) and OT (1-2 hrs/day, 5 days/week) to address physical and functional deficits in the context of the above medical diagnosis(es)? Yes Addressing deficits in the following areas: balance, endurance, locomotion, strength, transferring, bowel/bladder control, bathing, dressing, feeding, grooming,  toileting and psychosocial support 6. Can the patient actively participate in an intensive therapy program of at least 3 hrs of therapy 5 days a week? Yes 7. The potential for patient to make measurable gains while on inpatient rehab is excellent 8. Anticipated functional outcomes upon discharge from inpatients are: modified independent and supervision PT, modified independent and supervision OT, n/a SLP 9. Estimated rehab length of stay to reach the above functional goals is: 7-10 days 10. Does the patient have adequate social supports to accommodate these discharge functional goals? Yes 11. Anticipated D/C setting: Home 12. Anticipated post D/C treatments: [place] therapy 13. Overall Rehab/Functional Prognosis: excellent    RECOMMENDATIONS: This patient's condition is appropriate for continued rehabilitative care in the following setting: CIR Patient has agreed to participate in recommended program. Yes Note that insurance prior authorization may be required for reimbursement for recommended care.  Comment: Admit to inpatient rehab today Meredith Staggers, MD, [place] Physical Medicine & Rehabilitation 05/17/2016  Retta Diones 05/17/2016    Revision History

## 2016-05-18 NOTE — Progress Notes (Signed)
Patient information reviewed and entered into eRehab system by Carlena Ruybal, RN, CRRN, PPS Coordinator.  Information including medical coding and functional independence measure will be reviewed and updated through discharge.     Per nursing patient was given "Data Collection Information Summary for Patients in Inpatient Rehabilitation Facilities with attached "Privacy Act Statement-Health Care Records" upon admission.  

## 2016-05-18 NOTE — Plan of Care (Signed)
Problem: RH Bed Mobility Goal: LTG Patient will perform bed mobility with assist (PT) LTG: Patient will perform bed mobility with assistance, with/without cues (PT). Outcome: Not Applicable Date Met: 63/81/77 Patient sleeps in lift chair at home.

## 2016-05-18 NOTE — Progress Notes (Signed)
Orthopedic Tech Progress Note Patient Details:  KAELANI KENDRICK 1960/11/11 [id number]  Patient ID: [name], female   DOB: 1960/07/09, 56 y.o.   MRN: [id number]   Hildred Priest 05/18/2016, 3:44 PM Called in advanced brace order; spoke with Carolyne Fiscal

## 2016-05-18 NOTE — Progress Notes (Signed)
[place] PHYSICAL MEDICINE & REHABILITATION     PROGRESS NOTE  Subjective/Complaints:  Pt seen sitting up in bed this AM.  She states she slept well overnight and she is ready to begin therapies.   ROS: Denies CP, SOB, N/V/D.  Objective: Vital Signs: Blood pressure 123/69, pulse 65, temperature 97.8 F (36.6 C), temperature source Oral, resp. rate 20, height 5\' 4"  (1.626 m), weight 83.5 kg (184 lb), SpO2 96 %. No results found.  Recent Labs  05/17/16 2025 05/18/16 0403  WBC 4.7 5.5  HGB 11.7* 11.7*  HCT 34.6* 34.4*  PLT 125* 124*    Recent Labs  05/17/16 2025 05/18/16 0403  NA  --  138  K  --  3.6  CL  --  103  GLUCOSE  --  114*  BUN  --  18  CREATININE 0.72 0.50  CALCIUM  --  9.1   CBG (last 3)  No results for input(s): GLUCAP in the last 72 hours.  Wt Readings from Last 3 Encounters:  05/18/16 83.5 kg (184 lb)  05/14/16 79.4 kg (175 lb)  11/25/15 77.1 kg (170 lb)    Physical Exam:  BP 123/69 (BP Location: Left Arm)   Pulse 65   Temp 97.8 F (36.6 C) (Oral)   Resp 20   Ht 5\' 4"  (1.626 m)   Wt 83.5 kg (184 lb)   SpO2 96%   BMI 31.58 kg/m  Gen: Well developed. NAD. Vital signs reviewed. Head: Normocephalic. Atraumatic. Eyes: EOMI. No discharge Cardiovascular: Normal rateand regular rhythm. No JVD. Respiratory: Effort normaland breath sounds normal.  GI: Soft. Bowel sounds are normal.  Musc: Edema b/l LE Neurological. Alert and oriented  Fair awareness of deficits.  Motor: RUE: 4-/5 proximal to distal LUE: 4+/5 proximal to distal RLE: 2+/5 HF, KE, ADF/PF LLE: 3-/5 HF, KE, ADF/PF Skin. Chronic lymphedema lower extremities with Cobandressings over both legs. Skin dry Psych: pleasant and cooperative  Assessment/Plan: 1. Functional deficits secondary to MS exacerbation which require 3+ hours per day of interdisciplinary therapy in a comprehensive inpatient rehab setting. Physiatrist is providing close team supervision and 24 hour management of  active medical problems listed below. Physiatrist and rehab team continue to assess barriers to discharge/monitor patient progress toward functional and medical goals.  Function:  Bathing Bathing position   Position: Wheelchair/chair at sink  Bathing parts Body parts bathed by patient: Right arm, Left arm, Chest, Abdomen, Right upper leg, Left upper leg Body parts bathed by helper: Back, Front perineal area, Buttocks  Bathing assist        Upper Body Dressing/Undressing Upper body dressing   What is the patient wearing?: Hospital gown                Upper body assist        Lower Body Dressing/Undressing Lower body dressing   What is the patient wearing?: Hospital Gown, Non-skid slipper socks           Non-skid slipper socks- Performed by helper: Don/doff right sock, Don/doff left sock                  Lower body assist        Toileting Toileting     Toileting steps completed by helper: Adjust clothing prior to toileting, Performs perineal hygiene, Adjust clothing after toileting    Toileting assist     Transfers Chair/bed transfer Chair/bed transfer activity did not occur: Safety/medical concerns Chair/bed transfer method: Stand pivot Chair/bed transfer  assist level: Touching or steadying assistance (Pt > 75%) Chair/bed transfer assistive device: Armrests, Environmental manager   Type: Manual Max wheelchair distance: 75 ft Assist Level: Touching or steadying assistance (Pt > 75%)  Cognition Comprehension Comprehension assist level: Understands complex 90% of the time/cues 10% of the time  Expression Expression assist level: Expresses basic needs/ideas: With no assist  Social Interaction Social Interaction assist level: Interacts appropriately with others with medication or extra time (anti-anxiety, antidepressant).  Problem Solving Problem solving assist level: Solves complex 90% of the time/cues < 10% of the  time  Memory Memory assist level: More than reasonable amount of time    Medical Problem List and Plan: 1. Decreased functional mobilitysecondary to MS exacerbation/UTI. Continue interferon as directed  Begin CIR  Records and imaging reviewed 2. DVT Prophylaxis/Anticoagulation: Subcutaneous Lovenox. Monitor for any bleeding episodes 3. Pain Management/chronic pain:Baclofen 10 mg 3 times a day, Duragesic patch 50 g, oxycodone as needed 4. Mood: Provide emotional support 5. Neuropsych: This patient iscapable of making decisions on herown behalf. 6. Skin/Wound Care: Routine skin care bilateral lower extremities 7. Fluids/Electrolytes/Nutrition: Routine I&Os  BMP within acceptable rage 4/11 8.UTI/neurogenic bladder. Completecourse of Keflex. Continue Ditropan.  PVR 3 Pt reports very overactive bladder at home 9.Seizure disorder. Keppra 500 mg twice a day 10.Constipation. 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 (tension PRAFO RLE) -aggressive ROM with therapy 12. Chronic lymphedema--continue compression wraps. Pt has Unna boots at home which husband will bring  [facility] consulted 76. Mild hypoalbuminemia  Cont to monitor 14. ABLA  Hb 11.7 on 4/11  Cont to monitor 15. Thrombocytopenia  Plts 124 on 4/11  Cont to monitor  LOS (Days) 1 A FACE TO FACE EVALUATION WAS PERFORMED  Ankit Lorie Phenix 05/18/2016 1:12 PM

## 2016-05-19 ENCOUNTER — Inpatient Hospital Stay: Payer: Medicare Other

## 2016-05-19 ENCOUNTER — Inpatient Hospital Stay (HOSPITAL_COMMUNITY): Payer: Medicare Other | Admitting: Occupational Therapy

## 2016-05-19 ENCOUNTER — Inpatient Hospital Stay (HOSPITAL_COMMUNITY): Payer: Medicare Other | Admitting: Physical Therapy

## 2016-05-19 ENCOUNTER — Inpatient Hospital Stay (HOSPITAL_COMMUNITY): Payer: Medicare Other

## 2016-05-19 DIAGNOSIS — R32 Unspecified urinary incontinence: Secondary | ICD-10-CM

## 2016-05-19 NOTE — Progress Notes (Signed)
Occupational Therapy Session Note  Patient Details  Name: Rebecca [name] MRN: [id number] Date of Birth: 12-12-1960  Today's Date: 05/19/2016 OT Individual Time: 8115-7262 OT Individual Time Calculation (min): 73 min    Short Term Goals: Week 1:  OT Short Term Goal 1 (Week 1): Pt will complete LB bathing sit to stand with min assist for 2 consecutive sessions.  OT Short Term Goal 2 (Week 1): Pt will complete LB dressing sit to stand with min assist using AE for donning clothing over feet.   OT Short Term Goal 3 (Week 1): Pt will complete toilet transfer with min assist stand pivot to 3:1 with use of the RW.   OT Short Term Goal 4 (Week 1): Pt will maintain standing for greater 3 mins with min assist during bathing or grooming tasks.   Skilled Therapeutic Interventions/Progress Updates:    Pt worked on bathing sit to stand at the sink.  Encouraged upright sitting posture during bathing tasks, but pt still with increased posterior tilt with slight thoracic kyphosis.  BUE AROM shoulder flexion 0-110 degrees noted when attempting to use the LH sponge for washing her back.  Unable to utilize the [facility] sponge for washing her feet secondary to bilateral una boots, so therapist assisted with adequately cleaning toes and in-between secondary to excessive skin.  Utilized reacher for threading brief but needed mod assist secondary to not being able to lift LEs and use reacher.  Max assist for sit to stand including flexing the RLE back far enough to weightbear through the leg.  Still with posterior LOB and decreased trunk and hip extension noted with standing, requiring mod assist to maintain balance when attempting washing peri area and pulling brief over hips.  Total assist for doffing and donning gripper socks as well.  Pt left in wheelchair at end of session with call button and phone in reach and pt working on self feeding.    Therapy Documentation Precautions:  Precautions Precautions: Fall Precaution  Comments: R PF contracture Restrictions Weight Bearing Restrictions: No  Pain: Pain Assessment Pain Assessment: No/denies pain ADL: See Function Navigator for Current Functional Status.   Therapy/Group: Individual Therapy  Saphia Vanderford  OTR/L 05/19/2016, 1:36 PM

## 2016-05-19 NOTE — Progress Notes (Signed)
Social Work Patient ID: Rebecca [name], female   DOB: Jul 20, 1960, 56 y.o.   MRN: [id number]  Met with pt and husband to discuss team conference goals supervision level and target discharge date 4/24. Discussed with husband closer to when she goes home to come in and participate in therapies with pt-to see what she can do and what she needs assistance with before going home. He is glad she is Getting intensive therapies here this is what she needs. Continue to work on discharge needs.

## 2016-05-19 NOTE — Progress Notes (Signed)
sPhysical Therapy Session Note  Patient Details  Name: Rebecca [name] MRN: [id number] Date of Birth: 11/29/1960  Today's Date: 05/19/2016 PT Individual Time: 0800-0923 PT Individual Time Calculation (min): 83 min   Short Term Goals: Week 1:  PT Short Term Goal 1 (Week 1): Patient will perform bed mobility with consistent min A using hospital bed functions. PT Short Term Goal 2 (Week 1): Patient will perform functional transfers with consistent min A.  PT Short Term Goal 3 (Week 1): Patient will propel wheelchair using BUE x 75 ft with supervision.  PT Short Term Goal 4 (Week 1): Patient will ambulate 15 ft using RW with min A.   Skilled Therapeutic Interventions/Progress Updates:   Patient in bed eating breakfast upon arrival. With [facility] raised and heavy use of rail, patient instructed in rolling to R with mod A to bring BLE off edge of bed and patient able to elevate trunk using BUE with increased time. Patient sat EOB with supervision to finish breakfast meal. Patient performed sit > stand from EOB with mod A using RW to perform stand pivot transfer to wheelchair with increased time and min A overall with patient forward flexed over front of RW. Patient provided wheelchair gloves to increase grip for wheelchair propulsion using BUE with supervision in straight path and HOH assist for coordinating turns with greatly increased time. Sit <> stand x 3 from wheelchair using stair railings with max A overall to maintain balance while moving hands from wheelchair to rail, able to maintain standing up to 2.5 to 3 min at a time with min-mod A and performing step taps to 3" step using LLE only for increased RLE WB with max verbal/visual/tactile cues for upright posture especially hip and trunk extension to neutral. Gait training using RW x 8 ft with assist to advance RW and max verbal/tactile cues for upright posture with gait characterized by increased lateral leans to circumduct RLE and assist to advance RLE  approx 75% of time due to extensor tone. Patient provided multiple prolonged seated rest breaks due to fatigue and decreased endurance. Instructed in diaphragmatic breathing and pursed lip breathing for active rest breaks and to decrease anxiety. Patient reports she "always walked hunched over the walker" because no one told her to stand upright and that she used transport chair always in community so she "wouldn't be in anyone's way." Patient consistently stating she chose the "easiest" way to do things PTA including sleeping in lift chair, maintaining trunk flexed over walker, and limiting ambulation to household only although she states she had no difficulty walking PTA. Patient left sitting in wheelchair with all needs within reach.     Therapy Documentation Precautions:  Precautions Precautions: Fall Precaution Comments: R PF contracture Restrictions Weight Bearing Restrictions: No Vital Signs: Therapy Vitals Pulse Rate: (!) 105 BP: 128/77 Patient Position (if appropriate): Sitting Oxygen Therapy SpO2: 98 % O2 Device: Not Delivered Pain: Pain Assessment Pain Assessment: No/denies pain   See Function Navigator for Current Functional Status.   Therapy/Group: Individual Therapy  Abygale Karpf, Murray Hodgkins 05/19/2016, 9:29 AM

## 2016-05-19 NOTE — Progress Notes (Signed)
Social Work [name], LCSW Social Worker Signed   Patient Care Conference Date of Service: 05/19/2016  9:38 AM      Hide copied text Hover for attribution information Inpatient RehabilitationTeam Conference and Plan of Care Update Date: 05/18/2016   Time: 2:00 PM      Patient Name: Rebecca [name]      Medical Record Number: [id number]  Date of Birth: 11-11-1960 Sex: Female         Room/Bed: 4W23C/4W23C-01 Payor Info: Payor: MEDICARE / Plan: MEDICARE PART A AND B / Product Type: *No Product type* /     Admitting Diagnosis: Ms Exacerbation  Admit Date/Time:  05/17/2016  1:33 PM Admission Comments: No comment available    Primary Diagnosis:  <principal problem not specified> Principal Problem: <principal problem not specified>       Patient Active Problem List    Diagnosis Date Noted  . Seizure disorder ([place])    . Slow transit constipation    . 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 Lymphedema    . Hypoalbuminemia due to protein-calorie malnutrition ([place])    . Acute blood loss anemia    . Multiple sclerosis exacerbation ([place]) 05/17/2016  . Neurogenic bladder 05/17/2016  . Multiple sclerosis ([place]) 05/14/2016  . Encounter for screening for cervical cancer  11/27/2015  . Preventative health care 11/25/2015  . History of breast cancer in female 10/28/2015  . Anemia 10/28/2015  . Thrombocytopenia ([place]) 10/28/2015  . Allergic rhinitis 10/28/2015  . IBS (irritable bowel syndrome) 10/28/2015  . Encounter for eye exam 08/25/2015  . Aortic valve disease    . Heart murmur 10/24/2014  . 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 Lymphedema of both lower extremities 10/24/2014  . History of right mastectomy 10/24/2014  . Need for immunization against influenza 10/24/2014  . Medicare annual wellness visit, subsequent 10/24/2014  . MS (multiple sclerosis) ([place]) 09/16/2014  . Hematoma complicating a procedure 03/06/2014  . Primary cancer of right female breast ([place]) 01/10/2014  . SOB (shortness of  breath) 03/01/2013  . Bilateral lower extremity edema    . 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 Herpes zoster 06/08/2011      Expected Discharge Date: Expected Discharge Date: 05/31/16   Team Members Present: Physician leading conference: Ilean Skill, PsyD;Dr. Delice Lesch Social Worker Present: Ovidio Kin, LCSW Nurse Present: Heather Roberts, RN PT Present: Carney Living, PT OT Present: Clyda Greener, OT SLP Present: Stormy Fabian, SLP PPS Coordinator present : Daiva Nakayama, RN, CRRN       Current Status/Progress Goal Weekly Team Focus  Medical     Decreased functional mobility secondary to MS exacerbation/UTI.   Improve mobility, safety, self-care  See above   Bowel/Bladder     patient is incontinenet of bowel & bladder, LBM 05/16/16  continence with min assist  assess for pattern & assist as needed   Swallow/Nutrition/ Hydration               ADL's     supervision for UB bathing with mod assist for LB bathing sit to stand.    supervision to min assist level  selfcare retraining, balance retraining, transfer training, neuromuscular re-education, pt/family education   Mobility     mod-max A bed mobility, min A stand pivot using RW with significantly increased time  supervision household  functional mobility, tone management, BLE NMR, activity tolerance, standing balance, postural control, balance strategies, pt/fam education   Communication  Safety/Cognition/ Behavioral Observations             Pain     no c/o pain  pain scale <4  assess & treat as needed   Skin     BLE lymphedema, right mastectomy site, port cath left chest  no new areas of skin breakdown  assess q shift     *See Care Plan and progress notes for long and short-term goals.   Barriers to Discharge: Neurogenic bladder, ABLA, thrombocytopenia, safety, mobility     Possible Resolutions to Barriers:  Therapies, follow labs, PVRs     Discharge Planning/Teaching Needs:     Home with husband who was assisting prior to admission. He will be able to provide 24 hr supervision at discharge.     Team Discussion:  New evaluation-goals-supervision level. Prafo boots ordered. MD-watching labs, neurogenic bladder-RN aware. Currently min-mod level of assist. Weakness B-LE  Revisions to Treatment Plan:  New eval    Continued Need for Acute Rehabilitation Level of Care: The patient requires daily medical management by a physician with specialized training in physical medicine and rehabilitation for the following conditions: Daily direction of a multidisciplinary physical rehabilitation program to ensure safe treatment while eliciting the highest outcome that is of practical value to the patient.: Yes Daily medical management of patient stability for increased activity during participation in an intensive rehabilitation regime.: Yes Daily analysis of laboratory values and/or radiology reports with any subsequent need for medication adjustment of medical intervention for : Neurological problems;Urological problems;Other   [name] 05/19/2016, 9:38 AM       Patient ID: Rebecca [name], female   DOB: 1960-05-12, 56 y.o.   MRN: [id number]

## 2016-05-19 NOTE — Progress Notes (Signed)
[facility] PHYSICAL MEDICINE & REHABILITATION     PROGRESS NOTE  Subjective/Complaints:  Pt seen laying in bed this Am.  She slept well overnight.  She had a good first day of therapies.   ROS: Denies CP, SOB, N/V/D.  Objective: Vital Signs: Blood pressure 128/77, pulse (!) 105, temperature 97.9 F (36.6 C), temperature source Oral, resp. rate 19, height 5\' 4"  (1.626 m), weight 83.5 kg (184 lb), SpO2 98 %. No results found.  Recent Labs  05/17/16 2025 05/18/16 0403  WBC 4.7 5.5  HGB 11.7* 11.7*  HCT 34.6* 34.4*  PLT 125* 124*    Recent Labs  05/17/16 2025 05/18/16 0403  NA  --  138  K  --  3.6  CL  --  103  GLUCOSE  --  114*  BUN  --  18  CREATININE 0.72 0.50  CALCIUM  --  9.1   CBG (last 3)  No results for input(s): GLUCAP in the last 72 hours.  Wt Readings from Last 3 Encounters:  05/18/16 83.5 kg (184 lb)  05/14/16 79.4 kg (175 lb)  11/25/15 77.1 kg (170 lb)    Physical Exam:  BP 128/77   Pulse (!) 105   Temp 97.9 F (36.6 C) (Oral)   Resp 19   Ht 5\' 4"  (1.626 m)   Wt 83.5 kg (184 lb)   SpO2 98%   BMI 31.58 kg/m  Gen: Well developed. NAD. Vital signs reviewed. Head: Normocephalic. Atraumatic. Eyes: EOMI. No discharge Cardiovascular: RRR. No JVD. Respiratory: Effort normal breath sounds normal.  GI: Soft. Bowel sounds are normal.  Musc: Edema b/l LE Neurological. Alert and oriented  Fair awareness of deficits.  Motor: RUE: 4-/5 proximal to distal LUE: 4+/5 proximal to distal RLE: 3/5 HF, KE, 4-/5 ADF/PF LLE: 3/5 HF, KE, 4-/5 ADF/PF Skin. Chronic lymphedema lower extremities with dressing over both legs. Skin dry Psych: pleasant and cooperative  Assessment/Plan: 1. Functional deficits secondary to MS exacerbation which require 3+ hours per day of interdisciplinary therapy in a comprehensive inpatient rehab setting. Physiatrist is providing close team supervision and 24 hour management of active medical problems listed below. Physiatrist and  rehab team continue to assess barriers to discharge/monitor patient progress toward functional and medical goals.  Function:  Bathing Bathing position   Position: Wheelchair/chair at sink  Bathing parts Body parts bathed by patient: Right arm, Left arm, Chest, Abdomen, Right upper leg, Left upper leg Body parts bathed by helper: Back, Front perineal area, Buttocks  Bathing assist        Upper Body Dressing/Undressing Upper body dressing   What is the patient wearing?: Hospital gown                Upper body assist        Lower Body Dressing/Undressing Lower body dressing   What is the patient wearing?: Hospital Gown, Non-skid slipper socks           Non-skid slipper socks- Performed by helper: Don/doff right sock, Don/doff left sock                  Lower body assist        Toileting Toileting   Toileting steps completed by patient: Adjust clothing prior to toileting, Adjust clothing after toileting Toileting steps completed by helper: Performs perineal hygiene Toileting Assistive Devices: Grab bar or rail  Toileting assist Assist level: Set up/obtain supplies   Transfers Chair/bed transfer Chair/bed transfer activity did not occur: Safety/medical concerns  Chair/bed transfer method: Stand pivot Chair/bed transfer assist level: Maximal assist (Pt 25 - 49%/lift and lower) Chair/bed transfer assistive device: Armrests, Walker Mechanical lift: Ecologist     Max distance: 8 ft Assist level: Moderate assist (Pt 50 - 74%)   Wheelchair   Type: Manual Max wheelchair distance: 100 ft Assist Level: Touching or steadying assistance (Pt > 75%)  Cognition Comprehension Comprehension assist level: Follows basic conversation/direction with extra time/assistive device  Expression Expression assist level: Expresses basic 90% of the time/requires cueing < 10% of the time.  Social Interaction Social Interaction assist level: Interacts  appropriately with others with medication or extra time (anti-anxiety, antidepressant).  Problem Solving Problem solving assist level: Solves basic problems with no assist  Memory Memory assist level: More than reasonable amount of time    Medical Problem List and Plan: 1. Decreased functional mobilitysecondary to MS exacerbation/UTI. Continue interferon as directed  Cont CIR 2. DVT Prophylaxis/Anticoagulation: Subcutaneous Lovenox. Monitor for any bleeding episodes 3. Pain Management/chronic pain:Baclofen 10 mg 3 times a day, Duragesic patch 50 g, oxycodone as needed 4. Mood: Provide emotional support 5. Neuropsych: This patient iscapable of making decisions on herown behalf. 6. Skin/Wound Care: Routine skin care bilateral lower extremities 7. Fluids/Electrolytes/Nutrition: Routine I&Os  BMP within acceptable rage 4/11 8.UTI/neurogenic bladder. Completecourse of Keflex. Continue Ditropan.  PVR 3 ordered Pt reports very overactive bladder at home 9.Seizure disorder. Keppra 500 mg twice a day 10.Constipation. 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 (tension PRAFO RLE)  aggressive ROM with therapy  Will consider Botox as outpt 12. Chronic lymphedema--continue compression wraps. Pt has Unna boots at home which husband will bring  Thomas consulted 43. Mild hypoalbuminemia  Cont to monitor 14. ABLA  Hb 11.7 on 4/11  Cont to monitor 15. Thrombocytopenia  Plts 124 on 4/11  Cont to monitor  LOS (Days) 2 A FACE TO FACE EVALUATION WAS PERFORMED  Tunis Gentle Lorie Phenix 05/19/2016 9:58 AM

## 2016-05-19 NOTE — Patient Care Conference (Signed)
Inpatient RehabilitationTeam Conference and Plan of Care Update Date: 05/18/2016   Time: 2:00 PM    Patient Name: Rebecca [name]      Medical Record Number: [id number]  Date of Birth: Aug 09, 1960 Sex: Female         Room/Bed: 4W23C/4W23C-01 Payor Info: Payor: MEDICARE / Plan: MEDICARE PART A AND B / Product Type: *No Product type* /    Admitting Diagnosis: Ms Exacerbation  Admit Date/Time:  05/17/2016  1:33 PM Admission Comments: No comment available   Primary Diagnosis:  <principal problem not specified> Principal Problem: <principal problem not specified>  Patient Active Problem List   Diagnosis Date Noted  . Seizure disorder ([place])   . Slow transit constipation   . 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 Lymphedema   . Hypoalbuminemia due to protein-calorie malnutrition ([place])   . Acute blood loss anemia   . Multiple sclerosis exacerbation ([place]) 05/17/2016  . Neurogenic bladder 05/17/2016  . Multiple sclerosis ([place]) 05/14/2016  . Encounter for screening for cervical cancer  11/27/2015  . Preventative health care 11/25/2015  . History of breast cancer in female 10/28/2015  . Anemia 10/28/2015  . Thrombocytopenia ([place]) 10/28/2015  . Allergic rhinitis 10/28/2015  . IBS (irritable bowel syndrome) 10/28/2015  . Encounter for eye exam 08/25/2015  . Aortic valve disease   . Heart murmur 10/24/2014  . 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 Lymphedema of both lower extremities 10/24/2014  . History of right mastectomy 10/24/2014  . Need for immunization against influenza 10/24/2014  . Medicare annual wellness visit, subsequent 10/24/2014  . MS (multiple sclerosis) ([place]) 09/16/2014  . Hematoma complicating a procedure 03/06/2014  . Primary cancer of right female breast ([place]) 01/10/2014  . SOB (shortness of breath) 03/01/2013  . Bilateral lower extremity edema   . 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 Herpes zoster 06/08/2011    Expected Discharge Date: Expected Discharge Date:  05/31/16  Team Members Present: Physician leading conference: Ilean Skill, PsyD;Dr. Delice Lesch Social Worker Present: Ovidio Kin, LCSW Nurse Present: Heather Roberts, RN PT Present: Carney Living, PT OT Present: Clyda Greener, OT SLP Present: Stormy Fabian, SLP PPS Coordinator present : Daiva Nakayama, RN, CRRN     Current Status/Progress Goal Weekly Team Focus  Medical   Decreased functional mobility secondary to MS exacerbation/UTI.   Improve mobility, safety, self-care  See above   Bowel/Bladder   patient is incontinenet of bowel & bladder, LBM 05/16/16  continence with min assist  assess for pattern & assist as needed   Swallow/Nutrition/ Hydration             ADL's   supervision for UB bathing with mod assist for LB bathing sit to stand.    supervision to min assist level  selfcare retraining, balance retraining, transfer training, neuromuscular re-education, pt/family education   Mobility   mod-max A bed mobility, min A stand pivot using RW with significantly increased time  supervision household  functional mobility, tone management, BLE NMR, activity tolerance, standing balance, postural control, balance strategies, pt/fam education   Communication             Safety/Cognition/ Behavioral Observations            Pain   no c/o pain  pain scale <4  assess & treat as needed   Skin   BLE lymphedema, right mastectomy site, port cath left chest  no new areas of skin breakdown  assess q shift      *See Care  Plan and progress notes for long and short-term goals.  Barriers to Discharge: Neurogenic bladder, ABLA, thrombocytopenia, safety, mobility    Possible Resolutions to Barriers:  Therapies, follow labs, PVRs    Discharge Planning/Teaching Needs:    Home with husband who was assisting prior to admission. He will be able to provide 24 hr supervision at discharge.     Team Discussion:  New evaluation-goals-supervision level. Prafo boots ordered. MD-watching labs,  neurogenic bladder-RN aware. Currently min-mod level of assist. Weakness B-LE  Revisions to Treatment Plan:  New eval   Continued Need for Acute Rehabilitation Level of Care: The patient requires daily medical management by a physician with specialized training in physical medicine and rehabilitation for the following conditions: Daily direction of a multidisciplinary physical rehabilitation program to ensure safe treatment while eliciting the highest outcome that is of practical value to the patient.: Yes Daily medical management of patient stability for increased activity during participation in an intensive rehabilitation regime.: Yes Daily analysis of laboratory values and/or radiology reports with any subsequent need for medication adjustment of medical intervention for : Neurological problems;Urological problems;Other  Rebecca [name] 05/19/2016, 9:38 AM

## 2016-05-19 NOTE — Progress Notes (Signed)
Physical Therapy Note  Patient Details  Name: RAEDYN KLINCK MRN: [id number] Date of Birth: [birth year] Today's Date: 05/19/2016  1015-1045, 30 min individual tx Pain: none reported  w/c propulsion using gil UEs, with instruction for improved efficiency for momentum and turns. Stand pivot to/from mat with RW, with assistance for fully standing and wt shifting in order to step more easily.  PROM bil soleus muscles via positioning in sitting.  neuromuscular re-education in standing with RW for wt shifting in order to [facility] R/L for stepping with contralateral foot.  Pt left resting in w/c with quick release belt applied and all needs within reach.  See function navigator for current status.   Kenzy Campoverde 05/19/2016, 10:47 AM

## 2016-05-20 ENCOUNTER — Inpatient Hospital Stay (HOSPITAL_COMMUNITY): Payer: Medicare Other | Admitting: Physical Therapy

## 2016-05-20 ENCOUNTER — Inpatient Hospital Stay (HOSPITAL_COMMUNITY): Payer: Medicare Other

## 2016-05-20 ENCOUNTER — Inpatient Hospital Stay (HOSPITAL_COMMUNITY): Payer: Medicare Other | Admitting: Occupational Therapy

## 2016-05-20 DIAGNOSIS — N3644 Muscular disorders of urethra: Secondary | ICD-10-CM

## 2016-05-20 MED ORDER — SENNOSIDES-DOCUSATE SODIUM 8.6-50 MG PO TABS
1.0000 | ORAL_TABLET | Freq: Two times a day (BID) | ORAL | Status: DC
  Administered 2016-05-20 – 2016-05-26 (×13): 1 via ORAL
  Filled 2016-05-20 (×14): qty 1

## 2016-05-20 MED ORDER — POLYETHYLENE GLYCOL 3350 17 G PO PACK
17.0000 g | PACK | Freq: Every day | ORAL | Status: DC
  Administered 2016-05-20 – 2016-05-23 (×4): 17 g via ORAL
  Filled 2016-05-20 (×4): qty 1

## 2016-05-20 NOTE — Progress Notes (Signed)
Physical Therapy Session Note  Patient Details  Name: Rebecca [name] MRN: [id number] Date of Birth: 1960/05/19  Today's Date: 05/20/2016 PT Individual Time: 0805-0900 PT Individual Time Calculation (min): 55 min   Short Term Goals: Week 1:  PT Short Term Goal 1 (Week 1): Patient will perform bed mobility with consistent min A using hospital bed functions. PT Short Term Goal 2 (Week 1): Patient will perform functional transfers with consistent min A.  PT Short Term Goal 3 (Week 1): Patient will propel wheelchair using BUE x 75 ft with supervision.  PT Short Term Goal 4 (Week 1): Patient will ambulate 15 ft using RW with min A.   Skilled Therapeutic Interventions/Progress Updates:   Patient in bed with RN present after changing brief. LB dressing with max A in supine and mod A to roll R and L. Patient transferred supine > sidelying > sit with with HOB raised and heavy use of rail with mod A to flex BLE and bring off EOB. Patient performed stand pivot transfer to wheelchair using RW with extra time and min A to facilitate anterior weight shift for sit <> stand and max verbal/tactile cues for upright posture and sequencing RW. Patient propelled wheelchair using BUE with supervision and greatly increased time x 50 ft, max multimodal cues for sequencing one turn. Performed stand pivot transfer to NuStep using RW with verbal cues for sequencing turn and min A. Performed NuStep using BUE and BLE at level 1 with focus on gross BLE flexion/extension for reciprocal pattern retraining, BLE NMR, and extensor tone reduction, x 5 min without LE attachments + 5 min with LE attachments to prevent BLE from collapsing into valgus. To transfer back to wheelchair, patient required max multimodal cues for sequencing and mod A for manual facilitation to weight shift R in order to step with LLE and greatly increased time, unable to achieve upright posture despite max multimodal cues. Patient unable to perform reciprocal  scooting back in wheelchair, moving en bloc with heavy dependence on BUE for all mobility. Patient reports fatigue at end of session but no pain. Patient left sitting in wheelchair with all needs in reach.   Therapy Documentation Precautions:  Precautions Precautions: Fall Precaution Comments: R PF contracture Restrictions Weight Bearing Restrictions: No Pain: Pain Assessment Pain Assessment: No/denies pain  See Function Navigator for Current Functional Status.   Therapy/Group: Individual Therapy  Leina Babe, Murray Hodgkins 05/20/2016, 8:50 AM

## 2016-05-20 NOTE — Progress Notes (Signed)
Occupational Therapy Session Note  Patient Details  Name: Rebecca [name] MRN: [id number] Date of Birth: 14-Dec-1960  Today's Date: 05/20/2016 OT Individual Time: 1415-1500 OT Individual Time Calculation (min): 45 min    Short Term Goals: Week 1:  OT Short Term Goal 1 (Week 1): Pt will complete LB bathing sit to stand with min assist for 2 consecutive sessions.  OT Short Term Goal 2 (Week 1): Pt will complete LB dressing sit to stand with min assist using AE for donning clothing over feet.   OT Short Term Goal 3 (Week 1): Pt will complete toilet transfer with min assist stand pivot to 3:1 with use of the RW.   OT Short Term Goal 4 (Week 1): Pt will maintain standing for greater 3 mins with min assist during bathing or grooming tasks.   Skilled Therapeutic Interventions/Progress Updates:    1:1 session focused on functional mobility, sit to stand, and LB dressing techniques. Pt propels w/c partial distances to/from all therapeutic destinations with significantly increased time and Vc for technique to avoid obstacles. At end of session pt able to identify which UE to push harder with to turn w/c in different directions. Pt stand pivot transfer w/c<>EOM wih MOD A for lifting and significantly increased time to shift weight to pivot/shuffle feet. Pt uses reacher to thread B feet into theraband loop over pants with A for LLE lifting off floor. Pt completes sit to stand and advances band past hips with MOD A for lifting and touching balance while advancing band past hips. Pt completes 4 more sit to stand to improve endurance, safety awareness and posture with MOD A and tactile cues at sternum/hips to facilitate hip/trunk extension. Exited session with pt seated in w/c with call light in reach and all needs met.   Therapy Documentation Precautions:  Precautions Precautions: Fall Precaution Comments: R PF contracture Restrictions Weight Bearing Restrictions: No  See Function Navigator for Current  Functional Status.   Therapy/Group: Individual Therapy  Tonny Branch 05/20/2016, 5:18 PM

## 2016-05-20 NOTE — Progress Notes (Signed)
Occupational Therapy Session Note  Patient Details  Name: Rebecca [name] MRN: [id number] Date of Birth: 1960/08/04  Today's Date: 05/20/2016 OT Individual Time: 1003-1057 OT Individual Time Calculation (min): 54 min    Short Term Goals: Week 1:  OT Short Term Goal 1 (Week 1): Pt will complete LB bathing sit to stand with min assist for 2 consecutive sessions.  OT Short Term Goal 2 (Week 1): Pt will complete LB dressing sit to stand with min assist using AE for donning clothing over feet.   OT Short Term Goal 3 (Week 1): Pt will complete toilet transfer with min assist stand pivot to 3:1 with use of the RW.   OT Short Term Goal 4 (Week 1): Pt will maintain standing for greater 3 mins with min assist during bathing or grooming tasks.   Skilled Therapeutic Interventions/Progress Updates:    Pt completed bathing and dressing sit to stand at the sink this session.  Supervision for all aspects of UB bathing and dressing, including donning pullover shirt.  She needed max assist for sit to stand from the wheelchair when completing LB bathing and for pulling pants over hips.  Decreased ability to position her LEs under her and translate her hips forward with sit to stand transition.  Forward trunk flexion and hip flexion present in standing with posterior LOB noted.  Mrs. Rud demonstrates decreased ability to complete thorough peri washing with the RUE and needed mod assist to complete this from therapist.  She was able to alternate hands when pulling up pants with mod assist to maintain standing balance.  Pt had already donned pants earlier so just pushed them down for bathing and then worked on pulling them back up.  Completed session with grooming tasks in supported sitting from wheelchair position to conclude session.  Pt left with call button and phone in reach.   Therapy Documentation Precautions:  Precautions Precautions: Fall Precaution Comments: R PF contracture Restrictions Weight Bearing  Restrictions: No   Pain: Pain Assessment Pain Assessment: No/denies pain ADL: See Function Navigator for Current Functional Status.   Therapy/Group: Individual Therapy  Arrion Broaddus OTR/L 05/20/2016, 10:58 AM

## 2016-05-20 NOTE — Progress Notes (Signed)
Occupational Therapy Session Note  Patient Details  Name: KEYASIA JOLLIFF MRN: [id number] Date of Birth: [birth year]  Today's Date: 05/20/2016 OT Individual Time: 1300-1330 OT Individual Time Calculation (min): 30 min   Short Term Goals: Week 1:  OT Short Term Goal 1 (Week 1): Pt will complete LB bathing sit to stand with min assist for 2 consecutive sessions.  OT Short Term Goal 2 (Week 1): Pt will complete LB dressing sit to stand with min assist using AE for donning clothing over feet.   OT Short Term Goal 3 (Week 1): Pt will complete toilet transfer with min assist stand pivot to 3:1 with use of the RW.   OT Short Term Goal 4 (Week 1): Pt will maintain standing for greater 3 mins with min assist during bathing or grooming tasks.   Skilled Therapeutic Interventions/Progress Updates:    Pt greeted in bathroom seated on toilet with Stedy around pt. Pt reports she was unable to void. Min A sit<>stand, able to remove unilateral UE to reach between legs for peri-care. OT washed buttocks to ensure thoroughness. Pt brought to the sink and stood in [place] with min guard A to complete hand-washing w/ supervision. Pt returned to wc and completed B UE there-ex using medium green thera-band. Bicep curl and triceps press 10x3. Pt left seated in wc at end of session with needs met.   Therapy Documentation Precautions:  Precautions Precautions: Fall Precaution Comments: R PF contracture Restrictions Weight Bearing Restrictions: No   Pain: Pain Assessment Pain Assessment: No/denies pain  See Function Navigator for Current Functional Status.  Therapy/Group: Individual Therapy  Valma Cava 05/20/2016, 1:33 PM

## 2016-05-20 NOTE — Progress Notes (Signed)
[place] PHYSICAL MEDICINE & REHABILITATION     PROGRESS NOTE  Subjective/Complaints:  Pt seen laying in bed this AM.  She slept well overnight.  She is doing well in therapies.  She complains of urinary frequency and constipation.   ROS: +Urinary freq, constipation. Denies CP, SOB, N/V/D.  Objective: Vital Signs: Blood pressure 117/64, pulse 65, temperature 98 F (36.7 C), temperature source Oral, resp. rate 18, height 5\' 4"  (1.626 m), weight 87.5 kg (193 lb), SpO2 97 %. No results found.  Recent Labs  05/17/16 2025 05/18/16 0403  WBC 4.7 5.5  HGB 11.7* 11.7*  HCT 34.6* 34.4*  PLT 125* 124*    Recent Labs  05/17/16 2025 05/18/16 0403  NA  --  138  K  --  3.6  CL  --  103  GLUCOSE  --  114*  BUN  --  18  CREATININE 0.72 0.50  CALCIUM  --  9.1   CBG (last 3)  No results for input(s): GLUCAP in the last 72 hours.  Wt Readings from Last 3 Encounters:  05/20/16 87.5 kg (193 lb)  05/14/16 79.4 kg (175 lb)  11/25/15 77.1 kg (170 lb)    Physical Exam:  BP 117/64 (BP Location: Left Arm)   Pulse 65   Temp 98 F (36.7 C) (Oral)   Resp 18   Ht 5\' 4"  (1.626 m)   Wt 87.5 kg (193 lb)   SpO2 97%   BMI 33.13 kg/m  Gen: Well developed. NAD. Vital signs reviewed. Head: Normocephalic. Atraumatic. Eyes: EOMI. No discharge Cardiovascular: RRR. No JVD. Respiratory: Effort normal breath sounds normal.  GI: Soft. Bowel sounds are normal.  Musc: Edema b/l LE Neurological. Alert and oriented  Fair awareness of deficits.  Motor: RUE: 4-/5 proximal to distal (stable) LUE: 4+/5 proximal to distal RLE: 3/5 HF, KE, 4-/5 ADF/PF LLE: 3/5 HF, KE, 4-/5 ADF/PF Skin. Chronic lymphedema lower extremities with dressing over both legs. Skin dry Psych: pleasant and cooperative  Assessment/Plan: 1. Functional deficits secondary to MS exacerbation which require 3+ hours per day of interdisciplinary therapy in a comprehensive inpatient rehab setting. Physiatrist is providing close  team supervision and 24 hour management of active medical problems listed below. Physiatrist and rehab team continue to assess barriers to discharge/monitor patient progress toward functional and medical goals.  Function:  Bathing Bathing position   Position: Wheelchair/chair at sink  Bathing parts Body parts bathed by patient: Right arm, Left arm, Chest, Abdomen, Right upper leg, Left upper leg Body parts bathed by helper: Right lower leg, Left lower leg, Buttocks, Front perineal area, Back  Bathing assist        Upper Body Dressing/Undressing Upper body dressing   What is the patient wearing?: Hospital gown                Upper body assist Assist Level: Supervision or verbal cues      Lower Body Dressing/Undressing Lower body dressing   What is the patient wearing?: Hospital Gown, Non-skid slipper socks           Non-skid slipper socks- Performed by helper: Don/doff right sock, Don/doff left sock                  Lower body assist        Toileting Toileting   Toileting steps completed by patient: Adjust clothing prior to toileting, Performs perineal hygiene, Adjust clothing after toileting Toileting steps completed by helper: Performs perineal hygiene Toileting Assistive Devices:  Grab bar or rail  Toileting assist Assist level: Touching or steadying assistance (Pt.75%)   Transfers Chair/bed transfer Chair/bed transfer activity did not occur: Safety/medical concerns Chair/bed transfer method: Stand pivot Chair/bed transfer assist level: Moderate assist (Pt 50 - 74%/lift or lower) Chair/bed transfer assistive device: Armrests, Walker Mechanical lift: Ec[employer]     Max distance: 8 ft Assist level: Moderate assist (Pt 50 - 74%)   Wheelchair   Type: Manual Max wheelchair distance: 50 ft Assist Level: Supervision or verbal cues  Cognition Comprehension Comprehension assist level: Follows basic conversation/direction with extra  time/assistive device  Expression Expression assist level: Expresses basic 90% of the time/requires cueing < 10% of the time.  Social Interaction Social Interaction assist level: Interacts appropriately with others with medication or extra time (anti-anxiety, antidepressant).  Problem Solving Problem solving assist level: Solves basic problems with no assist  Memory Memory assist level: More than reasonable amount of time    Medical Problem List and Plan: 1. Decreased functional mobilitysecondary to MS exacerbation/UTI. Continue interferon as directed  Cont CIR 2. DVT Prophylaxis/Anticoagulation: Subcutaneous Lovenox. Monitor for any bleeding episodes 3. Pain Management/chronic pain:Baclofen 10 mg 3 times a day, Duragesic patch 50 g, oxycodone as needed 4. Mood: Provide emotional support 5. Neuropsych: This patient iscapable of making decisions on herown behalf. 6. Skin/Wound Care: Routine skin care bilateral lower extremities 7. Fluids/Electrolytes/Nutrition: Routine I&Os  BMP within acceptable rage 4/11 8.UTI/neurogenic bladder. Completecourse of Keflex.   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 Keppra 500 mg twice a day 10.Constipation. 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 (tension PRAFO RLE)  aggressive ROM with therapy  Will consider Botox as outpt 12. Chronic lymphedema--continue compression wraps. Pt has Unna boots at home which husband will bring  [place] consulted 74. Mild hypoalbuminemia  Cont to monitor 14. ABLA  Hb 11.7 on 4/11  Cont to monitor 15. Thrombocytopenia  Plts 124 on 4/11  Cont to monitor 16. Constipation  Increased bowel reg 4/13  LOS (Days) 3 A FACE TO FACE EVALUATION WAS PERFORMED  Ankit Lorie Phenix 05/20/2016 9:45 AM

## 2016-05-21 ENCOUNTER — Inpatient Hospital Stay (HOSPITAL_COMMUNITY): Payer: Medicare Other | Admitting: Physical Therapy

## 2016-05-21 ENCOUNTER — Inpatient Hospital Stay (HOSPITAL_COMMUNITY): Payer: Medicare Other

## 2016-05-21 ENCOUNTER — Inpatient Hospital Stay (HOSPITAL_COMMUNITY): Payer: Medicare Other | Admitting: Occupational Therapy

## 2016-05-21 MED ORDER — OXYBUTYNIN CHLORIDE ER 10 MG PO TB24
10.0000 mg | ORAL_TABLET | Freq: Every day | ORAL | Status: DC
  Administered 2016-05-21: 10 mg via ORAL
  Filled 2016-05-21: qty 1

## 2016-05-21 MED ORDER — PROSIGHT PO TABS
1.0000 | ORAL_TABLET | Freq: Two times a day (BID) | ORAL | Status: DC
  Administered 2016-05-21 – 2016-06-01 (×22): 1 via ORAL
  Filled 2016-05-21 (×27): qty 1

## 2016-05-21 NOTE — Progress Notes (Signed)
Occupational Therapy Session Note  Patient Details  Name: Rebecca [name] MRN: [id number] Date of Birth: 09-20-1960  Today's Date: 05/21/2016 OT Individual Time: 1650-1750 OT Individual Time Calculation (min): 60 min    Short Term Goals: Week 1:  OT Short Term Goal 1 (Week 1): Pt will complete LB bathing sit to stand with min assist for 2 consecutive sessions.  OT Short Term Goal 2 (Week 1): Pt will complete LB dressing sit to stand with min assist using AE for donning clothing over feet.   OT Short Term Goal 3 (Week 1): Pt will complete toilet transfer with min assist stand pivot to 3:1 with use of the RW.   OT Short Term Goal 4 (Week 1): Pt will maintain standing for greater 3 mins with min assist during bathing or grooming tasks.   Skilled Therapeutic Interventions/Progress Updates:    1:1. No complaints of pain. Husband present for session. Pt propels w/c partially to/from therapuetic destinations to improve BUE strength and endurance required for ADLs with VC for turning techniques. In gym, pt stand pivot transfer with RW w/c<>EOM with CGA VC for safety awareness and significantly increased time to complete transfer. Pt stands with RW with MIN A for balance and VC for posture and crosses midline to reach for horseshoes at head height and throws horseshoes at target to improve weight shifting and standing with unilateral support needed for clothing management. Pt requires one seated rest break during first set of 12 horse shoes, and no rest break on second set. OT explains rules of dominoes and pt able to follow rules with min VC to play game in standing with unilateral support on RW with MIN A for balance. In seated pt completes practices clothing fasteners (buttons, zippers, laces, velcro) with increased time d/t decreased [place]. Exited session with pt set up with dinner tray seated in w/c with call light in reach and husband present.   Therapy Documentation Precautions:   Precautions Precautions: Fall Precaution Comments: R PF contracture Restrictions Weight Bearing Restrictions: No General:   See Function Navigator for Current Functional Status.   Therapy/Group: Individual Therapy  Tonny Branch 05/21/2016, 6:14 PM

## 2016-05-21 NOTE — Progress Notes (Signed)
Physical Therapy Session Note  Patient Details  Name: Rebecca [name] MRN: [id number] Date of Birth: 1960/10/08  Today's Date: 05/21/2016 PT Individual Time: 0811-0905 PT Individual Time Calculation (min): 54 min   Short Term Goals: Week 1:  PT Short Term Goal 1 (Week 1): Patient will perform bed mobility with consistent min A using hospital bed functions. PT Short Term Goal 2 (Week 1): Patient will perform functional transfers with consistent min A.  PT Short Term Goal 3 (Week 1): Patient will propel wheelchair using BUE x 75 ft with supervision.  PT Short Term Goal 4 (Week 1): Patient will ambulate 15 ft using RW with min A.   Skilled Therapeutic Interventions/Progress Updates: Pt presented in bed completing breakfast and agreeable to therapy once finished. Pt presented in bed with episode of urinary incontinence. Performed rolling L/R for cleanup modA. Donned brief and maxA for threading pants. Mod A supine to sit with HOB elevated and use of bed rail with cues for sequencing for LE management. Donned shirt with minA for time management. Stand pivot with RW with additional time required and cues for wt shifting to facilitate transfer. Performed w/c mobilty x 34f  requiring x 2 turns and maintaining clearance around obstacles requiring min to modA for turns and technique. Pt transferred to NuStep with RW with minA and additional time. Performed NuStep x6 min for endurance and reciprocal movement of BUE/BLE. Pt returned to w/c in same manner and transported back to room for time management. Pt remained in w/c at end of session with current needs met.      Therapy Documentation Precautions:  Precautions Precautions: Fall Precaution Comments: R PF contracture Restrictions Weight Bearing Restrictions: No General: PT Amount of Missed Time (min): 6 Minutes PT Missed Treatment Reason: Other (Comment) (Breakfast)   See Function Navigator for Current Functional Status.   Therapy/Group:  Individual Therapy  Rebecca [name]  Rebecca [name], PTA  05/21/2016, 12:55 PM

## 2016-05-21 NOTE — Progress Notes (Signed)
[place] PHYSICAL MEDICINE & REHABILITATION     PROGRESS NOTE  Subjective/Complaints:  Complains of constipation and ongoing urinary frequency. pvr's are 200-300+cc  ROS: pt denies nausea, vomiting, diarrhea, cough, shortness of breath or chest pain  Objective: Vital Signs: Blood pressure 118/66, pulse 73, temperature 98.2 F (36.8 C), temperature source Oral, resp. rate 18, height 5\' 4"  (1.626 m), weight 87.5 kg (193 lb), SpO2 97 %. No results found. No results for input(s): WBC, HGB, HCT, PLT in the last 72 hours. No results for input(s): NA, K, CL, GLUCOSE, BUN, CREATININE, CALCIUM in the last 72 hours.  Invalid input(s): CO CBG (last 3)  No results for input(s): GLUCAP in the last 72 hours.  Wt Readings from Last 3 Encounters:  05/20/16 87.5 kg (193 lb)  05/14/16 79.4 kg (175 lb)  11/25/15 77.1 kg (170 lb)    Physical Exam:  BP 118/66 (BP Location: Left Arm)   Pulse 73   Temp 98.2 F (36.8 C) (Oral)   Resp 18   Ht 5\' 4"  (1.626 m)   Wt 87.5 kg (193 lb)   SpO2 97%   BMI 33.13 kg/m  Gen: Well developed. NAD. Vital signs reviewed. Head: Normocephalic. Atraumatic. Eyes: EOMI. No discharge Cardiovascular: RRR. No JVD. Respiratory: Effort normal breath sounds normal.  GI: Soft. Bowel sounds are normal.  Musc: Edema b/l LE Neurological. Alert and oriented  Fair awareness of deficits.  Motor: RUE: 4-/5 proximal to distal (stable) LUE: 4+/5 proximal to distal RLE: 3/5 HF, KE, 4-/5 ADF/PF LLE: 3/5 HF, KE, 4-/5 ADF/PF Skin. Chronic lymphedema lower extremities with dressing over both legs. Skin dry Psych: pleasant and cooperative  Assessment/Plan: 1. Functional deficits secondary to MS exacerbation which require 3+ hours per day of interdisciplinary therapy in a comprehensive inpatient rehab setting. Physiatrist is providing close team supervision and 24 hour management of active medical problems listed below. Physiatrist and rehab team continue to assess barriers  to discharge/monitor patient progress toward functional and medical goals.  Function:  Bathing Bathing position   Position: Wheelchair/chair at sink  Bathing parts Body parts bathed by patient: Right arm, Left arm, Chest, Abdomen, Right upper leg, Left upper leg Body parts bathed by helper: Back, Buttocks, Front perineal area  Bathing assist        Upper Body Dressing/Undressing Upper body dressing   What is the patient wearing?: Hospital gown                Upper body assist Assist Level: Supervision or verbal cues      Lower Body Dressing/Undressing Lower body dressing   What is the patient wearing?: Hospital Gown, Non-skid slipper socks           Non-skid slipper socks- Performed by helper: Don/doff right sock, Don/doff left sock                  Lower body assist        Toileting Toileting Toileting activity did not occur: No continent bowel/bladder event Toileting steps completed by patient: Adjust clothing prior to toileting, Adjust clothing after toileting, Performs perineal hygiene Toileting steps completed by helper: Performs perineal hygiene Toileting Assistive Devices: Grab bar or rail  Toileting assist Assist level: Touching or steadying assistance (Pt.75%)   Transfers Chair/bed transfer Chair/bed transfer activity did not occur: Safety/medical concerns Chair/bed transfer method: Stand pivot Chair/bed transfer assist level: Moderate assist (Pt 50 - 74%/lift or lower) Chair/bed transfer assistive device: Armrests, Walker Mechanical lift: [employer]  Locomotion Ambulation     Max distance: 8 ft Assist level: Moderate assist (Pt 50 - 74%)   Wheelchair   Type: Manual Max wheelchair distance: 50 ft Assist Level: Supervision or verbal cues  Cognition Comprehension Comprehension assist level: Follows basic conversation/direction with extra time/assistive device  Expression Expression assist level: Expresses basic needs/ideas: With extra  time/assistive device  Social Interaction Social Interaction assist level: Interacts appropriately with others with medication or extra time (anti-anxiety, antidepressant).  Problem Solving Problem solving assist level: Solves basic problems with no assist  Memory Memory assist level: More than reasonable amount of time    Medical Problem List and Plan: 1. Decreased functional mobilitysecondary to MS exacerbation/UTI. Continue interferon as directed  Cont CIR therapies 2. DVT Prophylaxis/Anticoagulation: Subcutaneous Lovenox. Monitor for any bleeding episodes 3. Pain Management/chronic pain:Baclofen 10 mg 3 times a day, Duragesic patch 50 g, oxycodone as needed 4. Mood: Provide emotional support 5. Neuropsych: This patient iscapable of making decisions on herown behalf. 6. Skin/Wound Care: Routine skin care bilateral lower extremities 7. Fluids/Electrolytes/Nutrition: Routine I&Os  BMP within acceptable rage 4/11 8.UTI/neurogenic bladder. Completecourse of Keflex.   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 Keppra 500 mg twice a day 10.Constipation. 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 (tension PRAFO RLE)  aggressive ROM with therapy  Will consider Botox as outpt 12. Chronic lymphedema--continue compression wraps. Pt has Unna boots at home which husband will bring  [place] consulted 64. Mild hypoalbuminemia  Cont to monitor 14. ABLA  Hb 11.7 on 4/11  Cont to monitor 15. Thrombocytopenia  Plts 124 on 4/11  Cont to monitor 16. Constipation  Increased bowel reg 4/13  -SSE today if no bm by this afternoon  LOS (Days) 4 A FACE TO FACE EVALUATION WAS PERFORMED  Lerone Onder T 05/21/2016 9:32 AM

## 2016-05-21 NOTE — Plan of Care (Signed)
Problem: RH BOWEL ELIMINATION Goal: RH STG MANAGE BOWEL WITH ASSISTANCE STG Manage Bowel with mod I Assistance.  Outcome: Not Progressing No bm > 3 days

## 2016-05-21 NOTE — Plan of Care (Signed)
Problem: RH BLADDER ELIMINATION Goal: RH STG MANAGE BLADDER WITH ASSISTANCE STG Manage Bladder With mod I Assistance  Outcome: Not Progressing Required I/O cath due to retention

## 2016-05-21 NOTE — Plan of Care (Signed)
Problem: RH BLADDER ELIMINATION Goal: RH STG MANAGE BLADDER WITH ASSISTANCE STG Manage Bladder With mod I Assistance  Outcome: Not Progressing Incontinent of urine, total assist

## 2016-05-21 NOTE — Plan of Care (Signed)
Problem: RH BOWEL ELIMINATION Goal: RH STG MANAGE BOWEL W/MEDICATION W/ASSISTANCE STG Manage Bowel with Medication with mod I Assistance.  Outcome: Not Progressing No BM > 3 days

## 2016-05-21 NOTE — Progress Notes (Signed)
Occupational Therapy Session Note  Patient Details  Name: Rebecca [name] MRN: [id number] Date of Birth: 21-Sep-1960  Today's Date: 05/21/2016 OT Individual Time: 1415-1530 OT Individual Time Calculation (min): 75 min    Short Term Goals: Week 1:  OT Short Term Goal 1 (Week 1): Pt will complete LB bathing sit to stand with min assist for 2 consecutive sessions.  OT Short Term Goal 2 (Week 1): Pt will complete LB dressing sit to stand with min assist using AE for donning clothing over feet.   OT Short Term Goal 3 (Week 1): Pt will complete toilet transfer with min assist stand pivot to 3:1 with use of the RW.   OT Short Term Goal 4 (Week 1): Pt will maintain standing for greater 3 mins with min assist during bathing or grooming tasks.   Skilled Therapeutic Interventions/Progress Updates: 1:1 When arrived for session - pt requiring cathing due to high PVRs. Transferred back into bed sit to stand with the STEADY with steadying A. Pt required max A for returning to supine (requiring A for trunk and bilateral LEs). Pt attempted to bridge for pulling pants down in bed but unable to achieve clearance. Pt required max A to pull pants down.  After cathed returned to EOB with bed rail with min A; with A for translation of left hip forward. Focus on threading pants with reacher at EOB - requiring mod A. Sit to stands from EOB this pm were with steadying A with RW. Pt then able to ambulate with RW ~15 feet towards the door with mod cuing for more erect trunk posture. After a seated rest break; pt then ambulated ~45 feet in the hallway with RW with steadying A. Pt performs mobility at a very slow rate ( continuing to make her an increased fall risk)- almost ~12 min to walk ~45 feet.     Returned to the room with husband with call bell in her lap.  Therapy Documentation Precautions:  Precautions Precautions: Fall Precaution Comments: R PF contracture Restrictions Weight Bearing Restrictions: No    Pain:  no c/o pain  See Function Navigator for Current Functional Status.   Therapy/Group: Individual Therapy  [facility] 05/21/2016, 4:08 PM

## 2016-05-22 ENCOUNTER — Inpatient Hospital Stay (HOSPITAL_COMMUNITY): Payer: Medicare Other | Admitting: Occupational Therapy

## 2016-05-22 LAB — URINALYSIS, ROUTINE W REFLEX MICROSCOPIC
Bilirubin Urine: NEGATIVE
Glucose, UA: NEGATIVE mg/dL
Hgb urine dipstick: NEGATIVE
Ketones, ur: NEGATIVE mg/dL
LEUKOCYTES UA: NEGATIVE
NITRITE: NEGATIVE
PH: 7 (ref 5.0–8.0)
PROTEIN: NEGATIVE mg/dL
Specific Gravity, Urine: 1.005 (ref 1.005–1.030)

## 2016-05-22 NOTE — Progress Notes (Signed)
2 Incontinent vds, close together, bladder scan=426ml, I & O cath=669ml. Rebecca [name] A

## 2016-05-22 NOTE — Progress Notes (Signed)
SSE given per orders, with Large BM. Patient reports feeling [name] lot better. Rebecca [name]

## 2016-05-22 NOTE — Progress Notes (Signed)
[facility] PHYSICAL MEDICINE & REHABILITATION     PROGRESS NOTE  Subjective/Complaints:  In good spirits. Emptied bowels yesterday after enema. Had to be cathed multiple times for volumes 300-600 cc's. Had already gotten full ditropan yesterday before order was changed  ROS: pt denies nausea, vomiting, diarrhea, cough, shortness of breath or chest pain   Objective: Vital Signs: Blood pressure (!) 120/59, pulse 73, temperature 98.2 F (36.8 C), temperature source Oral, resp. rate 18, height 5\' 4"  (1.626 m), weight 87.5 kg (193 lb), SpO2 96 %. No results found. No results for input(s): WBC, HGB, HCT, PLT in the last 72 hours. No results for input(s): NA, K, CL, GLUCOSE, BUN, CREATININE, CALCIUM in the last 72 hours.  Invalid input(s): CO CBG (last 3)  No results for input(s): GLUCAP in the last 72 hours.  Wt Readings from Last 3 Encounters:  05/20/16 87.5 kg (193 lb)  05/14/16 79.4 kg (175 lb)  11/25/15 77.1 kg (170 lb)    Physical Exam:  BP (!) 120/59 (BP Location: Left Arm)   Pulse 73   Temp 98.2 F (36.8 C) (Oral)   Resp 18   Ht 5\' 4"  (1.626 m)   Wt 87.5 kg (193 lb)   SpO2 96%   BMI 33.13 kg/m  Gen: Well developed. NAD. Vital signs reviewed. Head: Normocephalic. Atraumatic. Eyes: EOMI. No discharge Cardiovascular: RRR Respiratory: CTA B GI: Soft. Bowel sounds are normal.  Musc: Edema b/l LE. Right heel cord contracture Neurological. Alert and oriented  Fair awareness of deficits.  Motor: RUE: 4-/5 proximal to distal (no change) LUE: 4+/5 proximal to distal RLE: 3/5 HF, KE, 4-/5 ADF/PF LLE: 3/5 HF, KE, 4-/5 ADF/PF Skin. Chronic lymphedema lower extremities with dressing over both legs. Skin dry Psych: pleasant and cooperative  Assessment/Plan: 1. Functional deficits secondary to MS exacerbation which require 3+ hours per day of interdisciplinary therapy in a comprehensive inpatient rehab setting. Physiatrist is providing close team supervision and 24 hour  management of active medical problems listed below. Physiatrist and rehab team continue to assess barriers to discharge/monitor patient progress toward functional and medical goals.  Function:  Bathing Bathing position   Position: Wheelchair/chair at sink  Bathing parts Body parts bathed by patient: Right arm, Left arm, Chest, Abdomen, Right upper leg, Left upper leg Body parts bathed by helper: Back, Buttocks, Front perineal area  Bathing assist        Upper Body Dressing/Undressing Upper body dressing   What is the patient wearing?: Hospital gown                Upper body assist Assist Level: Supervision or verbal cues      Lower Body Dressing/Undressing Lower body dressing   What is the patient wearing?: Hospital Gown, Non-skid slipper socks           Non-skid slipper socks- Performed by helper: Don/doff right sock, Don/doff left sock                  Lower body assist        Toileting Toileting Toileting activity did not occur: No continent bowel/bladder event Toileting steps completed by patient: Adjust clothing prior to toileting, Adjust clothing after toileting, Performs perineal hygiene Toileting steps completed by helper: Adjust clothing prior to toileting, Performs perineal hygiene, Adjust clothing after toileting Toileting Assistive Devices: Grab bar or rail  Toileting assist Assist level: Touching or steadying assistance (Pt.75%)   Transfers Chair/bed transfer Chair/bed transfer activity did not occur: Safety/medical  concerns Chair/bed transfer method: Stand pivot Chair/bed transfer assist level: Touching or steadying assistance (Pt > 75%) Chair/bed transfer assistive device: Armrests, Walker Mechanical lift: Ecologist     Max distance: 8 ft Assist level: Moderate assist (Pt 50 - 74%)   Wheelchair   Type: Manual Max wheelchair distance: 50 ft Assist Level: Supervision or verbal cues  Cognition Comprehension  Comprehension assist level: Follows basic conversation/direction with extra time/assistive device  Expression Expression assist level: Expresses basic needs/ideas: With extra time/assistive device  Social Interaction Social Interaction assist level: Interacts appropriately with others with medication or extra time (anti-anxiety, antidepressant).  Problem Solving Problem solving assist level: Solves basic problems with no assist  Memory Memory assist level: More than reasonable amount of time    Medical Problem List and Plan: 1. Decreased functional mobilitysecondary to MS exacerbation/UTI. Continue interferon as directed  Cont CIR therapies 2. DVT Prophylaxis/Anticoagulation: Subcutaneous Lovenox. Monitor for any bleeding episodes 3. Pain Management/chronic pain:Baclofen 10 mg 3 times a day, Duragesic patch 50 g, oxycodone as needed 4. Mood: Provide emotional support 5. Neuropsych: This patient iscapable of making decisions on herown behalf. 6. Skin/Wound Care: Routine skin care bilateral lower extremities 7. Fluids/Electrolytes/Nutrition: Routine I&Os  BMP within acceptable rage 4/11 8.UTI/neurogenic bladder. Completecourse of Keflex.   Likely component of DSD  PVR's and bladder volumes now 300-600--frequent I/O caths needed Pt reports very overactive bladder at home  -fairly significant retention--stop ditropan for now and observe  -continueTimed voiding, oob to void, double voids 9.Seizure disorder. Keppra 500 mg twice a day 10.Constipation. 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 (tension PRAFO RLE---needs to be taut---instructed pt/RN)  aggressive ROM with therapy  Will consider Botox as outpt 12. Chronic lymphedema--continue compression wraps. Pt has Unna boots at home which husband will bring  [place] consulted 27. Mild hypoalbuminemia  Cont to monitor 14. ABLA  Hb 11.7 on 4/11  Cont to monitor 15.  Thrombocytopenia  Plts 124 on 4/11  Cont to monitor 16. Constipation  Increased bowel reg 4/13  -SSE on 4/14 with results  LOS (Days) 5 A FACE TO FACE EVALUATION WAS PERFORMED  SWARTZ,ZACHARY T 05/22/2016 10:05 AM

## 2016-05-22 NOTE — Progress Notes (Signed)
Occupational Therapy Session Note  Patient Details  Name: Rebecca [name] MRN: [id number] Date of Birth: [birth year]  Today's Date: 05/22/2016 OT Individual Time:  -  0900-0930   (30 min)      Short Term Goals: Week 1:  OT Short Term Goal 1 (Week 1): Pt will complete LB bathing sit to stand with min assist for 2 consecutive sessions.  OT Short Term Goal 2 (Week 1): Pt will complete LB dressing sit to stand with min assist using AE for donning clothing over feet.   OT Short Term Goal 3 (Week 1): Pt will complete toilet transfer with min assist stand pivot to 3:1 with use of the RW.   OT Short Term Goal 4 (Week 1): Pt will maintain standing for greater 3 mins with min assist during bathing or grooming tasks.  Week 2:     Skilled Therapeutic Interventions/Progress Updates:    Addressed bed mobility, transfers, functional mobility.  PPt required mod assist with supine to EOB.  Ambulated 15 feet with RW and min assist.  No pain, but is void a lot due to meds.  Performed grooming at sink and bathing.  Completed with mod assist.  Left with all needs in reach.    Therapy Documentation Precautions:  Precautions Precautions: Fall Precaution Comments: R PF contracture Restrictions Weight Bearing Restrictions: No General:   Vital Signs: Therapy Vitals Temp: 98.2 F (36.8 C) Temp Source: Oral Pulse Rate: 73 Resp: 18 BP: (!) 120/59 Patient Position (if appropriate): Lying Oxygen Therapy SpO2: 96 % O2 Device: Not Delivered Pain:  none          See Function Navigator for Current Functional Status.   Therapy/Group: Individual Therapy  Lisa Roca 05/22/2016, 8:01 AM

## 2016-05-22 NOTE — Plan of Care (Signed)
Problem: RH BOWEL ELIMINATION Goal: RH STG MANAGE BOWEL WITH ASSISTANCE STG Manage Bowel with mod I Assistance.  Outcome: Not Progressing Total assist, enema given.   Problem: RH BLADDER ELIMINATION Goal: RH STG MANAGE BLADDER WITH ASSISTANCE STG Manage Bladder With mod I Assistance  Outcome: Not Progressing Incontinent, I & O cath

## 2016-05-22 NOTE — Progress Notes (Signed)
Patient has a cluster of intact blisters to Lt upper buttocks. MD Naaman Plummer notified, No new orders received.  Foam applied to Blister area. Blanchable redness noted to upper inner buttocks-?MASD- Barrier Cream applied. Redness/start of blisters noted to Lt mid buttocks-Allevyn applied. Continue with plan of care.

## 2016-05-23 ENCOUNTER — Inpatient Hospital Stay (HOSPITAL_COMMUNITY): Payer: Medicare Other

## 2016-05-23 ENCOUNTER — Inpatient Hospital Stay (HOSPITAL_COMMUNITY): Payer: Medicare Other | Admitting: Physical Therapy

## 2016-05-23 ENCOUNTER — Inpatient Hospital Stay (HOSPITAL_COMMUNITY): Payer: Medicare Other | Admitting: Occupational Therapy

## 2016-05-23 ENCOUNTER — Ambulatory Visit: Payer: Medicare Other | Admitting: Diagnostic Neuroimaging

## 2016-05-23 DIAGNOSIS — B029 Zoster without complications: Secondary | ICD-10-CM

## 2016-05-23 DIAGNOSIS — N39490 Overflow incontinence: Secondary | ICD-10-CM

## 2016-05-23 DIAGNOSIS — B019 Varicella without complication: Secondary | ICD-10-CM

## 2016-05-23 LAB — URINE CULTURE: Culture: NO GROWTH

## 2016-05-23 MED ORDER — VALACYCLOVIR HCL 500 MG PO TABS
1000.0000 mg | ORAL_TABLET | Freq: Three times a day (TID) | ORAL | Status: AC
  Administered 2016-05-23 – 2016-05-30 (×21): 1000 mg via ORAL
  Filled 2016-05-23 (×21): qty 2

## 2016-05-23 MED ORDER — TAMSULOSIN HCL 0.4 MG PO CAPS
0.4000 mg | ORAL_CAPSULE | Freq: Every day | ORAL | Status: DC
  Administered 2016-05-23 – 2016-05-28 (×6): 0.4 mg via ORAL
  Filled 2016-05-23 (×7): qty 1

## 2016-05-23 MED ORDER — FUROSEMIDE 20 MG PO TABS
20.0000 mg | ORAL_TABLET | Freq: Every day | ORAL | Status: DC
  Administered 2016-05-23: 20 mg via ORAL
  Filled 2016-05-23: qty 1

## 2016-05-23 MED ORDER — POLYETHYLENE GLYCOL 3350 17 G PO PACK
17.0000 g | PACK | Freq: Two times a day (BID) | ORAL | Status: DC
  Administered 2016-05-23 – 2016-06-02 (×17): 17 g via ORAL
  Filled 2016-05-23 (×20): qty 1

## 2016-05-23 NOTE — Progress Notes (Signed)
Physical Therapy Note  Patient Details  Name: JASMINA GENDRON MRN: [id number] Date of Birth: [birth year] Today's Date: 75 min individual tx Pain: none reported w/c propulsion x 60' including turns, with supervision for straight path, min assist to complete turns.  Pt is starting to remember techniques for increased efficiency/function when self propelling. Sit> stand in front of high-raised mat, focusing on hip and trunk extenison, forward gaze, with bil foremarm support on mat, x 85minutes.  Terminal hip ext x 10 using same set up.  Modified Stroop test in supported sitting = 12 seconds naming words, 17 seconds naming colors; in supported standing 10 seconds naming words, 24 seconds naming colors, indicating significant difficulty with dual tasks in standing and ambulation positions.  Lateral leans x 5 with UE support fading to 0 support x 5, R/L to encourage wt shifting and head and trunk righting. Gait with RW x 13' with min assist, very slowly, with R knee hyperextending during stance, RLE circumducting during swing due to lack or R knee flexion due to spasticity.  Pt exhausted; returned to bed and left in the care of [facility] [employer] to re-do Una boots.   See function navigator for current status.   Cailynn Bodnar 05/23/2016, 3:21 PM

## 2016-05-23 NOTE — Progress Notes (Signed)
Physical Therapy Session Note  Patient Details  Name: Rebecca [name] MRN: [id number] Date of Birth: [birth year]  Today's Date: 05/23/2016 PT Individual Time:  0901-1000    4158-3094 PT Individual Time Calculation (min): 59 min            27 min   Short Term Goals: Week 1:  PT Short Term Goal 1 (Week 1): Patient will perform bed mobility with consistent min A using hospital bed functions. PT Short Term Goal 2 (Week 1): Patient will perform functional transfers with consistent min A.  PT Short Term Goal 3 (Week 1): Patient will propel wheelchair using BUE x 75 ft with supervision.  PT Short Term Goal 4 (Week 1): Patient will ambulate 15 ft using RW with min A.   Skilled Therapeutic Interventions/Progress Updates:    Session 1: Pt in bed upon arrival, agreeable to participate with PT. Pt requests using bathroom before heading to gym. Bed mobility: supine>sitting mod assist with LEs, pt using rails to assist. Sit<>stand from bed, BSC, and w/c. Good hand placement, cues for posture and assist with balance when transitioning UE to rw (posterior LOB). Mod assist with donning pants. Pt propelling w/c 75 ft with encouragement in halls, distance limited by reports of fatigue. In w/c- bilateral dorsiflexion and knee flexion stretches performed. Pt up in w/c with all needs in reach. Pt reports feeling tired but not exhausted.   Session 2: Pt returning from bathroom with nursing upon arrival, using stedy. Pt agreeable to PT session. Working on sit<>stand transfers with emphasis on forward lean and foot placement. Repetitions being performed when pt reports being incontinent of urine. Pt assisted with changing briefs and pericare. Pt declined working on standing with rw for care, requests use of stedy for additional support. Pt able to stand in stedy and assist with hygiene and donning pants. Gait performed following in room with rw, 7 ft with min assist, close follow with w/c for safety. Pt in w/c after session  with all needs in reach.    Therapy Documentation Precautions:  Precautions Precautions: Fall Precaution Comments: R PF contracture Restrictions Weight Bearing Restrictions: No  Pain:  8/10 back pain, monitored during session. Nursing providing pain meds.    See Function Navigator for Current Functional Status.   Therapy/Group: Individual Therapy  Linard Millers, PT 05/23/2016, 9:39 AM

## 2016-05-23 NOTE — Progress Notes (Signed)
Occupational Therapy Session Note  Patient Details  Name: Rebecca [name] MRN: [id number] Date of Birth: 12/17/1960  Today's Date: 05/23/2016 OT Individual Time: 3790-2409 OT Individual Time Calculation (min): 60 min    Short Term Goals: Week 1:  OT Short Term Goal 1 (Week 1): Pt will complete LB bathing sit to stand with min assist for 2 consecutive sessions.  OT Short Term Goal 2 (Week 1): Pt will complete LB dressing sit to stand with min assist using AE for donning clothing over feet.   OT Short Term Goal 3 (Week 1): Pt will complete toilet transfer with min assist stand pivot to 3:1 with use of the RW.   OT Short Term Goal 4 (Week 1): Pt will maintain standing for greater 3 mins with min assist during bathing or grooming tasks.   Skilled Therapeutic Interventions/Progress Updates:    Pt completed toilet transfer and toileting to start the session.  Used Clarise Cruz Steady to transfer from wheelchair at bed side to the 3:1 over the toilet secondary to pt needing to go immediately.  Max assist for clothing management and hygiene in standing with mod assist for transfer back out to the wheelchair at the sink.  She was able to complete all UB bathing with supervision including washing her back with use of the LH sponge.  Mod assist with mod instructional cueing for hand placement for all sit to stand transitions during LB selfcare.  Decreased efficiency for completing peri washing and drying, therapist giving mod assist for completion.  Noted rash on buttocks in 2 separate locations as well.  Nursing made aware.  Therapist assisted with donning pants over feet secondary to time with mod assist needed to pull them up over her hips once standing.  Finished session with therapist assist to wash bilateral toes and donn gripper socks.  Pt left in wheelchair with call button and phone in reach.    Therapy Documentation Precautions:  Precautions Precautions: Fall Precaution Comments: R PF  contracture Restrictions Weight Bearing Restrictions: No  Pain: Pain Assessment Pain Assessment: No/denies pain ADL: See Function Navigator for Current Functional Status.   Therapy/Group: Individual Therapy  Kavitha Lansdale  OTR/L 05/23/2016, 12:47 PM

## 2016-05-23 NOTE — Plan of Care (Signed)
Problem: RH BLADDER ELIMINATION Goal: RH STG MANAGE BLADDER WITH ASSISTANCE STG Manage Bladder With mod I Assistance  Outcome: Not Progressing Incontinent, requiring I & O caths at times

## 2016-05-23 NOTE — Progress Notes (Addendum)
PHYSICAL MEDICINE & REHABILITATION     PROGRESS NOTE  Subjective/Complaints:  Pt seen laying in bed this AM. Per nursing and pt, she awake urinating all night.  She also has constipation and lesions noted on buttock.   ROS: +Urinary freq. Denies CP, SOB, N/V/D.  Objective: Vital Signs: Blood pressure (!) 115/57, pulse 66, temperature 97.8 F (36.6 C), temperature source Oral, resp. rate 18, height 5\' 4"  (1.626 m), weight 87.5 kg (193 lb), SpO2 96 %. No results found. No results for input(s): WBC, HGB, HCT, PLT in the last 72 hours. No results for input(s): NA, K, CL, GLUCOSE, BUN, CREATININE, CALCIUM in the last 72 hours.  Invalid input(s): CO CBG (last 3)  No results for input(s): GLUCAP in the last 72 hours.  Wt Readings from Last 3 Encounters:  05/20/16 87.5 kg (193 lb)  05/14/16 79.4 kg (175 lb)  11/25/15 77.1 kg (170 lb)    Physical Exam:  BP (!) 115/57 (BP Location: Left Arm)   Pulse 66   Temp 97.8 F (36.6 C) (Oral)   Resp 18   Ht 5\' 4"  (1.626 m)   Wt 87.5 kg (193 lb)   SpO2 96%   BMI 33.13 kg/m  Gen: Well developed. NAD. Vital signs reviewed. Head: Normocephalic. Atraumatic. Eyes: EOMI. No discharge Cardiovascular: RRR. No JVD. Respiratory: CTA B. Unlabored. GI: Soft. Bowel sounds are normal.  Musc: Edema b/l LE. Right heel cord contracture Neurological. Alert and oriented  Fair awareness of deficits.  Motor: RUE: 4-/5 proximal to distal (stable) LUE: 4+/5 proximal to distal RLE: 3/5 HF, KE, 4-/5 ADF/PF LLE: 3/5 HF, KE, 4-/5 ADF/PF Skin. Chronic lymphedema lower extremities with dressing over both legs.  Blistering lesion on buttock Psych: pleasant and cooperative  Assessment/Plan: 1. Functional deficits secondary to MS exacerbation which require 3+ hours per day of interdisciplinary therapy in a comprehensive inpatient rehab setting. Physiatrist is providing close team supervision and 24 hour management of active medical problems listed  below. Physiatrist and rehab team continue to assess barriers to discharge/monitor patient progress toward functional and medical goals.  Function:  Bathing Bathing position   Position: Wheelchair/chair at sink  Bathing parts Body parts bathed by patient: Right arm, Left arm, Chest, Abdomen, Right upper leg, Left upper leg Body parts bathed by helper: Back, Buttocks, Front perineal area  Bathing assist        Upper Body Dressing/Undressing Upper body dressing   What is the patient wearing?: Hospital gown                Upper body assist Assist Level: Supervision or verbal cues      Lower Body Dressing/Undressing Lower body dressing   What is the patient wearing?: Hospital Gown, Non-skid slipper socks           Non-skid slipper socks- Performed by helper: Don/doff right sock, Don/doff left sock                  Lower body assist        Toileting Toileting Toileting activity did not occur: No continent bowel/bladder event Toileting steps completed by patient: Adjust clothing prior to toileting, Adjust clothing after toileting, Performs perineal hygiene Toileting steps completed by helper: Adjust clothing prior to toileting, Performs perineal hygiene, Adjust clothing after toileting Toileting Assistive Devices: Grab bar or rail  Toileting assist Assist level: Touching or steadying assistance (Pt.75%)   Transfers Chair/bed transfer Chair/bed transfer activity did not occur: Safety/medical concerns Chair/bed transfer  method: Stand pivot Chair/bed transfer assist level: Touching or steadying assistance (Pt > 75%) Chair/bed transfer assistive device: Armrests, Walker Mechanical lift: Ecologist     Max distance: 8 ft Assist level: Moderate assist (Pt 50 - 74%)   Wheelchair   Type: Manual Max wheelchair distance: 50 ft Assist Level: Supervision or verbal cues  Cognition Comprehension Comprehension assist level: Follows basic  conversation/direction with no assist  Expression Expression assist level: Expresses basic needs/ideas: With extra time/assistive device  Social Interaction Social Interaction assist level: Interacts appropriately with others with medication or extra time (anti-anxiety, antidepressant).  Problem Solving Problem solving assist level: Solves basic problems with no assist  Memory Memory assist level: More than reasonable amount of time    Medical Problem List and Plan: 1. Decreased functional mobilitysecondary to MS exacerbation/UTI. Continue interferon as directed  Cont CIR therapies  Weekend notes reviewed 2. DVT Prophylaxis/Anticoagulation: Subcutaneous Lovenox. Monitor for any bleeding episodes 3. Pain Management/chronic pain:Baclofen 10 mg 3 times a day, Duragesic patch 50 g, oxycodone as needed 4. Mood: Provide emotional support 5. Neuropsych: This patient iscapable of making decisions on herown behalf. 6. Skin/Wound Care: Routine skin care bilateral lower extremities 7. Fluids/Electrolytes/Nutrition: Routine I&Os  BMP within acceptable rage 4/11  Labs ordered for tomorrow 8.UTI/neurogenic bladder/DSD. Completecourse of Keflex.   Pt reports very overactive bladder at home  continueTimed voiding, oob to void, double voids  Flomax started 4/16  Lasix 20mg  qhs started 4/16 9.Seizure disorder. Keppra 500 mg twice a day 10.Constipation. 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 (tension PRAFO RLE---needs to be taut---instructed pt/RN)  aggressive ROM with therapy  Will consider Botox as outpt 12. Chronic lymphedema--continue compression wraps. Pt has Unna boots at home which husband will bring  [place] consulted 21. Mild hypoalbuminemia  Cont to monitor 14. ABLA  Hb 11.7 on 4/11  Cont to monitor  Labs ordered for tomorrow 15. Thrombocytopenia  Plts 124 on 4/11  Cont to monitor  Labs ordered for tomorrow 16.  Constipation  Increased bowel reg 4/13 again 4/16 17. VZV  Valcyclovir started 4/16-4/23  LOS (Days) 6 A FACE TO FACE EVALUATION WAS PERFORMED  Mary-Anne Polizzi Lorie Phenix 05/23/2016 9:52 AM

## 2016-05-24 ENCOUNTER — Inpatient Hospital Stay (HOSPITAL_COMMUNITY): Payer: Medicare Other | Admitting: Physical Therapy

## 2016-05-24 ENCOUNTER — Ambulatory Visit: Payer: Medicare Other | Admitting: Family Medicine

## 2016-05-24 ENCOUNTER — Inpatient Hospital Stay (HOSPITAL_COMMUNITY): Payer: Medicare Other

## 2016-05-24 ENCOUNTER — Inpatient Hospital Stay (HOSPITAL_COMMUNITY): Payer: Medicare Other | Admitting: Occupational Therapy

## 2016-05-24 LAB — BASIC METABOLIC PANEL
ANION GAP: 10 (ref 5–15)
BUN: 10 mg/dL (ref 6–20)
CALCIUM: 9.1 mg/dL (ref 8.9–10.3)
CO2: 26 mmol/L (ref 22–32)
Chloride: 102 mmol/L (ref 101–111)
Creatinine, Ser: 0.48 mg/dL (ref 0.44–1.00)
GFR calc Af Amer: >60 mL/min (ref >60)
GFR calc non Af Amer: >60 mL/min (ref >60)
Glucose, Bld: 110 mg/dL — ABNORMAL HIGH (ref 65–99)
POTASSIUM: 3.5 mmol/L (ref 3.5–5.1)
Sodium: 138 mmol/L (ref 135–145)

## 2016-05-24 LAB — CBC WITH DIFFERENTIAL/PLATELET
BASOS ABS: 0 10*3/uL (ref 0.0–0.1)
BASOS PCT: 0 %
Eosinophils Absolute: 0.1 10*3/uL (ref 0.0–0.7)
Eosinophils Relative: 3 %
HEMATOCRIT: 37.1 % (ref 36.0–46.0)
Hemoglobin: 12.3 g/dL (ref 12.0–15.0)
Lymphocytes Relative: 21 %
Lymphs Abs: 0.8 10*3/uL (ref 0.7–4.0)
MCH: 30.1 pg (ref 26.0–34.0)
MCHC: 33.2 g/dL (ref 30.0–36.0)
MCV: 90.9 fL (ref 78.0–100.0)
Monocytes Absolute: 0.4 10*3/uL (ref 0.1–1.0)
Monocytes Relative: 10 %
NEUTROS PCT: 67 %
Neutro Abs: 2.5 10*3/uL (ref 1.7–7.7)
Platelets: 111 10*3/uL — ABNORMAL LOW (ref 150–400)
RBC: 4.08 MIL/uL (ref 3.87–5.11)
RDW: 12.6 % (ref 11.5–15.5)
WBC: 3.8 10*3/uL — ABNORMAL LOW (ref 4.0–10.5)

## 2016-05-24 MED ORDER — OXYBUTYNIN CHLORIDE 5 MG PO TABS
5.0000 mg | ORAL_TABLET | Freq: Every day | ORAL | Status: DC
  Administered 2016-05-24 – 2016-05-29 (×6): 5 mg via ORAL
  Filled 2016-05-24 (×6): qty 1

## 2016-05-24 MED ORDER — FUROSEMIDE 20 MG PO TABS
20.0000 mg | ORAL_TABLET | Freq: Every day | ORAL | Status: DC
  Administered 2016-05-24 – 2016-05-29 (×6): 20 mg via ORAL
  Filled 2016-05-24 (×6): qty 1

## 2016-05-24 NOTE — Progress Notes (Signed)
Occupational Therapy Weekly Progress Note  Patient Details  Name: Rebecca [name] MRN: [id number] Date of Birth: 06/18/1960  Beginning of progress report period: May 18, 2016 End of progress report period: May 24, 2016  Today's Date: 05/24/2016 OT Individual Time: 1194-1740 OT Individual Time Calculation (min): 60 min    Patient has met 1 of 4 short term goals.  Pt continues to demonstrate limitations in bilateral knee flexion AROM with regards to sit to stand.  Min to mod assist is needed to achieve sit to stand depending on the height of the surface she is standing from as well as if there are arm rests or supports to push up from.  Once standing she continues to demonstrate a flexed trunk posture with decreased ability to adequately advance either LE efficiently for functional mobility.  Increased time is needed to complete short distance mobility to the bathroom, especially with turning and transfers.  She continues to need total assist for donning and doffing gripper socks as well as for threading items over her feet.  Have utilized reacher for assisting with this but she demonstrates decreased ability to raise her LEs while reaching toward her feet with the reacher itself.  Feel she is making progress but at a slower rate than originally anticipated.  Feel she will need at least min assist for functional transfers and selfcare tasks at discharge, which her husband should be able to assist with .    Patient continues to demonstrate the following deficits: muscle weakness and muscle joint tightness, abnormal tone and decreased standing balance, decreased postural control and decreased balance strategies and therefore will continue to benefit from skilled OT intervention to enhance overall performance with BADL.  Patient not progressing toward long term goals.  See goal revision..  Continue plan of care.  OT Short Term Goals Week 2:  OT Short Term Goal 1 (Week 2): Continue working on  established LTGs set at supervision to min assist level.    Skilled Therapeutic Interventions/Progress Updates:    Pt assisted with rolling her wheelchair part of the way to and from the therapy gym with occasional mod instructional cueing for technique.  Therapist assisted with rolling for 3/4 of distance.  Once in the gym had pt complete stand pivot transfer to the therapy mat with min assist.  In sitting worked on bilateral UE exercises.  Pt completed 2 sets of 10 repetitions for shoulder flexion using 4 lb dowel rod and 2 sets of 20 repetitions for elbow flexion.   Encouraged upright sitting posture as pt sits in posterior pelvic tilt with increased thoracic rounding.  Performed shoulder extension exercises as well with light resistance orange theraband for 2 sets of 10 repetitions and mod demonstrational cueing.  Also completed 2 sets of 10 reps for shoulder horizontal abduction with theraband as well.  Mod assist needed for technique and for maintaining arms at chest level.  Mod assist for transfer back to the wheelchair from therapy mat with use of the RW.  Encouraged pt to work on scapular adduction exercises and upright sitting posture when she is in the room.  Returned to room via wheelchair with pt pushing 1/3 of the way and therapist pushing her 3/4 of the way.  Pt left in wheelchair at end of session with call button and phone in reach.    Therapy Documentation Precautions:  Precautions Precautions: Fall Precaution Comments: R PF contracture Restrictions Weight Bearing Restrictions: No  Pain: Pain Assessment Pain Assessment: No/denies pain Pain  Score: 0-No pain ADL:  See Function Navigator for Current Functional Status.   Therapy/Group: Individual Therapy  Johanthan Kneeland OTR/L 05/24/2016, 12:46 PM

## 2016-05-24 NOTE — Progress Notes (Signed)
Orthopedic Tech Progress Note Patient Details:  Rebecca [name] [birth year] [id number]  Patient ID: Rebecca [name], female   DOB: Nov 21, 1960, 56 y.o.   MRN: [id number]   Rebecca [name] 05/24/2016, 4:31 PMBilateral unna boots was applied Monday April 16th at [facility],  Rebecca [name] applied left unna boot and Rebecca [name] applied right Haematologist.

## 2016-05-24 NOTE — Progress Notes (Addendum)
[facility] PHYSICAL MEDICINE & REHABILITATION     PROGRESS NOTE  Subjective/Complaints:  Pt seen sitting up in her chair working with therapies.  She had difficulty sleeping overnight due to frequent urination.   ROS: +Urinary freq. Denies CP, SOB, N/V/D.  Objective: Vital Signs: Blood pressure (!) 109/52, pulse 80, temperature 98.1 F (36.7 C), temperature source Oral, resp. rate 19, height 5\' 4"  (1.626 m), weight 87.5 kg (193 lb), SpO2 98 %. No results found.  Recent Labs  05/24/16 0539  WBC 3.8*  HGB 12.3  HCT 37.1  PLT PENDING    Recent Labs  05/24/16 0539  NA 138  K 3.5  CL 102  GLUCOSE 110*  BUN 10  CREATININE 0.48  CALCIUM 9.1   CBG (last 3)  No results for input(s): GLUCAP in the last 72 hours.  Wt Readings from Last 3 Encounters:  05/20/16 87.5 kg (193 lb)  05/14/16 79.4 kg (175 lb)  11/25/15 77.1 kg (170 lb)    Physical Exam:  BP (!) 109/52 (BP Location: Left Arm)   Pulse 80   Temp 98.1 F (36.7 C) (Oral)   Resp 19   Ht 5\' 4"  (1.626 m)   Wt 87.5 kg (193 lb)   SpO2 98%   BMI 33.13 kg/m  Gen: Well developed. NAD. Vital signs reviewed. Head: Normocephalic. Atraumatic. Eyes: EOMI. No discharge Cardiovascular: RRR. No JVD. Left chest wall port.  Respiratory: CTA B. Unlabored. GI: Soft. Bowel sounds are normal.  Musc: Edema b/l LE. Right heel cord contracture Neurological. Alert and oriented  Fair awareness of deficits.  Motor: RUE: 4-/5 proximal to distal (unchanged) LUE: 4+/5 proximal to distal RLE: 3/5 HF, KE, 4-/5 ADF/PF LLE: 3/5 HF, KE, 4-/5 ADF/PF Skin. Chronic lymphedema lower extremities with dressing over both legs.  Blistering lesion on buttock Psych: pleasant and cooperative  Assessment/Plan: 1. Functional deficits secondary to MS exacerbation which require 3+ hours per day of interdisciplinary therapy in a comprehensive inpatient rehab setting. Physiatrist is providing close team supervision and 24 hour management of active  medical problems listed below. Physiatrist and rehab team continue to assess barriers to discharge/monitor patient progress toward functional and medical goals.  Function:  Bathing Bathing position   Position: Wheelchair/chair at sink  Bathing parts Body parts bathed by patient: Right arm, Left arm, Chest, Abdomen, Right upper leg, Left upper leg, Back Body parts bathed by helper: Front perineal area, Buttocks, Right lower leg, Left lower leg  Bathing assist        Upper Body Dressing/Undressing Upper body dressing   What is the patient wearing?: Pull over shirt/dress     Pull over shirt/dress - Perfomed by patient: Thread/unthread right sleeve, Thread/unthread left sleeve, Put head through opening, Pull shirt over trunk          Upper body assist Assist Level: Supervision or verbal cues      Lower Body Dressing/Undressing Lower body dressing   What is the patient wearing?: Pants, Non-skid slipper socks       Pants- Performed by helper: Thread/unthread right pants leg, Thread/unthread left pants leg, Pull pants up/down   Non-skid slipper socks- Performed by helper: Don/doff right sock, Don/doff left sock                  Lower body assist Assist for lower body dressing: Touching or steadying assistance (Pt > 75%)      Toileting Toileting Toileting activity did not occur: No continent bowel/bladder event Toileting steps completed  by patient: Adjust clothing prior to toileting, Adjust clothing after toileting, Performs perineal hygiene Toileting steps completed by helper: Adjust clothing prior to toileting, Performs perineal hygiene, Adjust clothing after toileting Toileting Assistive Devices: Grab bar or rail  Toileting assist Assist level: Touching or steadying assistance (Pt.75%)   Transfers Chair/bed transfer Chair/bed transfer activity did not occur: Safety/medical concerns Chair/bed transfer method: Stand pivot Chair/bed transfer assist level: Moderate  assist (Pt 50 - 74%/lift or lower) Chair/bed transfer assistive device: Environmental manager lift: Ecologist     Max distance: 13 Assist level: Touching or steadying assistance (Pt > 75%)   Wheelchair   Type: Manual Max wheelchair distance: 60 Assist Level: Touching or steadying assistance (Pt > 75%)  Cognition Comprehension Comprehension assist level: Follows basic conversation/direction with no assist  Expression Expression assist level: Expresses basic needs/ideas: With extra time/assistive device  Social Interaction Social Interaction assist level: Interacts appropriately with others with medication or extra time (anti-anxiety, antidepressant).  Problem Solving Problem solving assist level: Solves basic problems with no assist  Memory Memory assist level: Recognizes or recalls 90% of the time/requires cueing < 10% of the time    Medical Problem List and Plan: 1. Decreased functional mobilitysecondary to MS exacerbation/UTI. Continue interferon as directed  Cont CIR therapies 2. DVT Prophylaxis/Anticoagulation: Subcutaneous Lovenox. Monitor for any bleeding episodes 3. Pain Management/chronic pain:Baclofen 20 mg 3 times a day, Duragesic patch 50 g, oxycodone as needed 4. Mood: Provide emotional support 5. Neuropsych: This patient iscapable of making decisions on herown behalf. 6. Skin/Wound Care: Routine skin care bilateral lower extremities 7. Fluids/Electrolytes/Nutrition: Routine I&Os  BMP within acceptable rage 4/17 8.UTI/neurogenic bladder/DSD. Completecourse of Keflex.   Pt reports very overactive bladder at home  continueTimed voiding, oob to void, double voids  Flomax started 4/16  Lasix 20mg  qhs started 4/16, changed to daily  Ditropan 5mg  qhs started 4/17 9.Seizure disorder. Keppra 500 mg twice a day 10.Constipation. 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 (tension PRAFO RLE---needs to  be taut---instructed pt/RN)  aggressive ROM with therapy  Will consider Botox as outpt 12. Chronic lymphedema--continue compression wraps. Pt has Unna boots at home which husband will bring  Aulander consulted 51. Mild hypoalbuminemia  Cont to monitor 14. ABLA  Hb 12.3 on 4/17  Cont to monitor 15. Thrombocytopenia  Plts pending on 4/17  Cont to monitor 16. Constipation  Increased bowel reg 4/13, again 4/16 17. VZV  Valcyclovir started 4/16-4/23  LOS (Days) 7 A FACE TO FACE EVALUATION WAS PERFORMED  Nalanie Winiecki Lorie Phenix 05/24/2016 9:26 AM

## 2016-05-24 NOTE — Progress Notes (Signed)
Physical Therapy Session Note  Patient Details  Name: Rebecca [name] MRN: [id number] Date of Birth: September 09, 1960  Today's Date: 05/24/2016 PT Individual Time: 3291-9166 PT Individual Time Calculation (min): 70 min   Short Term Goals: Week 1:  PT Short Term Goal 1 (Week 1): Patient will perform bed mobility with consistent min A using hospital bed functions. PT Short Term Goal 2 (Week 1): Patient will perform functional transfers with consistent min A.  PT Short Term Goal 3 (Week 1): Patient will propel wheelchair using BUE x 75 ft with supervision.  PT Short Term Goal 4 (Week 1): Patient will ambulate 15 ft using RW with min A.   Skilled Therapeutic Interventions/Progress Updates:   Patient in wheelchair with husband [name] present to observe session. Patient propelled wheelchair approx 100 ft using BUE with increased time, supervision in straight path and supervision-min A for turns with max verbal/visual cues for efficient propulsion technique when turning. Sit <> stand transfers from wheelchair with max A faded to mod A to translate weight anterior over BOS and verbal cues for pushing up from arm rests. Gait training using RW x 15 ft in straight path with supervision-min A and increased time with max verbal cues for upright posture and advancing RW. Patient performed stand pivot transfer to NuStep using RW with no cues for sequencing. Performed NuStep using BUE/BLE at level 3 for BLE neuro re-ed and to facilitate reciprocal pattern retraining x 12 min with RLE attachment to prevent knee valgus and x 2 min using BLE only with min A to prevent from pushing with RLE in order to extend LLE. Practiced simulated car transfer to [place] with patient's husband who provided mod A to lift BLE in/out of car and max A from therapist for sit > stand from low seat surface. Patient's husband reports providing mod A for car transfers at baseline. Plan to practice actual car transfer with husband this week in  preparation for DC home. Patient left sitting in wheelchair with all needs in reach and husband in room.   Therapy Documentation Precautions:  Precautions Precautions: Fall Precaution Comments: R PF contracture Restrictions Weight Bearing Restrictions: No Pain: Pain Assessment Pain Assessment: 0-10 Pain Score: 8  Pain Type: Acute pain Pain Location: Knee Pain Orientation: Right Pain Descriptors / Indicators: Spasm;Aching;Discomfort Pain Onset: On-going Pain Intervention(s): Repositioned;Ambulation/increased activity;Rest  See Function Navigator for Current Functional Status.   Therapy/Group: Individual Therapy  Drelyn Pistilli, Murray Hodgkins 05/24/2016, 3:04 PM

## 2016-05-24 NOTE — Progress Notes (Signed)
Occupational Therapy Session Note  Patient Details  Name: Rebecca [name] MRN: [id number] Date of Birth: Apr 10, 1960  Today's Date: 05/24/2016 OT Individual Time: 0700-0800 OT Individual Time Calculation (min): 60 min    Short Term Goals: Week 2:     Skilled Therapeutic Interventions/Progress Updates:    Pt supine in bed reporting back pain, but pain relieved by sitting up in w/c. Pt requests to finish breakfast seated at EOB. Pt requires MIN A to move RUE to EOB. Pt stand pivot transfer with RW with MMIN A for balance from elevated surface. Pt bathes 7/8 body parts with A for buttocks and Vc to use Long handled sponge to wash back. Pt requres MOD A for lifting and MIN A for balance sit to stand at sink level. Pt participates in washing hair with shower cap by reaching with BUE to scrub hair and comb wet hair after. While seated in w/c pt dons pull over shirt with set up. Pt dons pull up pants at sit to stand level at sink with VC for reacher technique and A to thread BLE. Pt sit to stand with MIN A for balance and advance pants over hips. Exited session with pt seated in w/c with call light in reach and all needs met.   Therapy Documentation Precautions:  Precautions Precautions: Fall Precaution Comments: R PF contracture Restrictions Weight Bearing Restrictions: No  See Function Navigator for Current Functional Status.   Therapy/Group: Individual Therapy  Tonny Branch 05/24/2016, 7:29 AM

## 2016-05-25 ENCOUNTER — Inpatient Hospital Stay (HOSPITAL_COMMUNITY): Payer: Medicare Other | Admitting: Physical Therapy

## 2016-05-25 ENCOUNTER — Inpatient Hospital Stay (HOSPITAL_COMMUNITY): Payer: Medicare Other | Admitting: Occupational Therapy

## 2016-05-25 NOTE — Progress Notes (Signed)
Social Work [name], LCSW Social Worker Signed   Patient Care Conference Date of Service: 05/25/2016  3:09 PM      Hide copied text Hover for attribution information Inpatient RehabilitationTeam Conference and Plan of Care Update Date: 05/25/2016   Time: 2:20 PM      Patient Name: Rebecca [name]      Medical Record Number: [id number]  Date of Birth: December 02, 1960 Sex: Female         Room/Bed: 4W23C/4W23C-01 Payor Info: Payor: MEDICARE / Plan: MEDICARE PART A AND B / Product Type: *No Product type* /     Admitting Diagnosis: Ms Exacerbation  Admit Date/Time:  05/17/2016  1:33 PM Admission Comments: No comment available    Primary Diagnosis:  <principal problem not specified> Principal Problem: <principal problem not specified>       Patient Active Problem List    Diagnosis Date Noted  . VZV (varicella-zoster virus) infection    . Detrusor and sphincter dyssynergia    . Urinary incontinence    . Seizure disorder ([place])    . Slow transit constipation    . 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[place])    . Lymphedema    . Hypoalbuminemia due to protein-calorie malnutrition ([place])    . Acute blood loss anemia    . Multiple sclerosis exacerbation ([place]) 05/17/2016  . Neurogenic bladder 05/17/2016  . Multiple sclerosis ([place]) 05/14/2016  . Encounter for screening for cervical cancer  11/27/2015  . Preventative health care 11/25/2015  . History of breast cancer in female 10/28/2015  . Anemia 10/28/2015  . Thrombocytopenia ([place]) 10/28/2015  . Allergic rhinitis 10/28/2015  . IBS (irritable bowel syndrome) 10/28/2015  . Encounter for eye exam 08/25/2015  . Aortic valve disease    . Heart murmur 10/24/2014  . 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 Lymphedema of both lower extremities 10/24/2014  . History of right mastectomy 10/24/2014  . Need for immunization against influenza 10/24/2014  . Medicare annual wellness visit, subsequent 10/24/2014  . MS (multiple sclerosis) ([place]) 09/16/2014  . Hematoma  complicating a procedure 03/06/2014  . Primary cancer of right female breast ([place]) 01/10/2014  . SOB (shortness of breath) 03/01/2013  . Bilateral lower extremity edema    . 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 Herpes zoster 06/08/2011      Expected Discharge Date: Expected Discharge Date: 05/31/16   Team Members Present: Physician leading conference: Dr. Delice Lesch Social Worker Present: Ovidio Kin, LCSW Nurse Present: Rayetta Pigg, RN PT Present: Carney Living, Dustin Folks, PT OT Present: Clyda Greener, OT PPS Coordinator present : Daiva Nakayama, RN, CRRN       Current Status/Progress Goal Weekly Team Focus  Medical     Decreased functional mobility secondary to MS exacerbation   Improve mobility, safety, urinary freq  See above   Bowel/Bladder     patient incontinent of bowel and bladder; LBM 05/21/16  decrease number of incontinent episodes  anticipate patient's need for toileting and assist   Swallow/Nutrition/ Hydration               ADL's     supervision for UB selfcare, min assist for bathing sit to stand with mod to max for dressing tasks  supervision to min assist level  selfcare retraining, balance retraining, transfer training, neuromuscular re-education, pt/family education   Mobility     min-mod A overall  downgraded to min A overall, mod A car transfer  functional mobility, tone management, BLE NMR, standing balance, postural control,  pt/family education and training   Communication               Safety/Cognition/ Behavioral Observations             Pain     occasional leg discomfort  <4  assess for pain q shift   Skin     BLE lymphedema, R mastectomy site, porta cath left chest; rash to lower buttocks  pt will be free of infection and skin breakdown  skin assessment q shift     *See Care Plan and progress notes for long and short-term goals.   Barriers to Discharge: Neurogenic bladder, thrombocytopenia, safety, mobility, VZV,  urinary freq     Possible Resolutions to Barriers:  Therapies, follow labs, adjust bladder meds, antivirals     Discharge Planning/Teaching Needs:  Home with husband who has been here to participate in therapies. Husband can provide care to her he was prior to admission      Team Discussion:  Progressing toward her goals-ambulation goals downgraded to min assist level. MD monitoring labs and treating for shingles. Meds for urinary frequency are working and pt slept better last night. Husband has started training for discharge.  Revisions to Treatment Plan:  Downgraded ambulation to min assist. DC 4/24    Continued Need for Acute Rehabilitation Level of Care: The patient requires daily medical management by a physician with specialized training in physical medicine and rehabilitation for the following conditions: Daily direction of a multidisciplinary physical rehabilitation program to ensure safe treatment while eliciting the highest outcome that is of practical value to the patient.: Yes Daily medical management of patient stability for increased activity during participation in an intensive rehabilitation regime.: Yes Daily analysis of laboratory values and/or radiology reports with any subsequent need for medication adjustment of medical intervention for : Neurological problems;Urological problems;Other;Wound care problems   [name] 05/25/2016, 3:09 PM      [name], LCSW Social Worker Signed   Patient Care Conference Date of Service: 05/19/2016  9:38 AM      Hide copied text Hover for attribution information Inpatient RehabilitationTeam Conference and Plan of Care Update Date: 05/18/2016   Time: 2:00 PM      Patient Name: Rebecca [name]      Medical Record Number: [id number]  Date of Birth: October 18, 1960 Sex: Female         Room/Bed: 4W23C/4W23C-01 Payor Info: Payor: MEDICARE / Plan: MEDICARE PART A AND B / Product Type: *No Product type* /     Admitting Diagnosis:  Ms Exacerbation  Admit Date/Time:  05/17/2016  1:33 PM Admission Comments: No comment available    Primary Diagnosis:  <principal problem not specified> Principal Problem: <principal problem not specified>       Patient Active Problem List    Diagnosis Date Noted  . Seizure disorder ([place])    . Slow transit constipation    . 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[place])    . Lymphedema    . Hypoalbuminemia due to protein-calorie malnutrition ([place])    . Acute blood loss anemia    . Multiple sclerosis exacerbation ([place]) 05/17/2016  . Neurogenic bladder 05/17/2016  . Multiple sclerosis ([place]) 05/14/2016  . Encounter for screening for cervical cancer  11/27/2015  . Preventative health care 11/25/2015  . History of breast cancer in female 10/28/2015  . Anemia 10/28/2015  . Thrombocytopenia ([place]) 10/28/2015  . Allergic rhinitis 10/28/2015  . IBS (irritable bowel syndrome) 10/28/2015  . Encounter for eye exam 08/25/2015  .  Aortic valve disease    . Heart murmur 10/24/2014  . 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 Lymphedema of both lower extremities 10/24/2014  . History of right mastectomy 10/24/2014  . Need for immunization against influenza 10/24/2014  . Medicare annual wellness visit, subsequent 10/24/2014  . MS (multiple sclerosis) ([place]) 09/16/2014  . Hematoma complicating a procedure 03/06/2014  . 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 SOB (shortness of breath) 03/01/2013  . Bilateral lower extremity edema    . 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 Herpes zoster 06/08/2011      Expected Discharge Date: Expected Discharge Date: 05/31/16   Team Members Present: Physician leading conference: Ilean Skill, PsyD;Dr. Delice Lesch Social Worker Present: Ovidio Kin, LCSW Nurse Present: Heather Roberts, RN PT Present: Carney Living, PT OT Present: Clyda Greener, OT SLP Present: Stormy Fabian, SLP PPS Coordinator present : Daiva Nakayama, RN, CRRN       Current  Status/Progress Goal Weekly Team Focus  Medical     Decreased functional mobility secondary to MS exacerbation/UTI.   Improve mobility, safety, self-care  See above   Bowel/Bladder     patient is incontinenet of bowel & bladder, LBM 05/16/16  continence with min assist  assess for pattern & assist as needed   Swallow/Nutrition/ Hydration               ADL's     supervision for UB bathing with mod assist for LB bathing sit to stand.    supervision to min assist level  selfcare retraining, balance retraining, transfer training, neuromuscular re-education, pt/family education   Mobility     mod-max A bed mobility, min A stand pivot using RW with significantly increased time  supervision household  functional mobility, tone management, BLE NMR, activity tolerance, standing balance, postural control, balance strategies, pt/fam education   Communication               Safety/Cognition/ Behavioral Observations             Pain     no c/o pain  pain scale <4  assess & treat as needed   Skin     BLE lymphedema, right mastectomy site, port cath left chest  no new areas of skin breakdown  assess q shift     *See Care Plan and progress notes for long and short-term goals.   Barriers to Discharge: Neurogenic bladder, ABLA, thrombocytopenia, safety, mobility     Possible Resolutions to Barriers:  Therapies, follow labs, PVRs     Discharge Planning/Teaching Needs:    Home with husband who was assisting prior to admission. He will be able to provide 24 hr supervision at discharge.     Team Discussion:  New evaluation-goals-supervision level. Prafo boots ordered. MD-watching labs, neurogenic bladder-RN aware. Currently min-mod level of assist. Weakness B-LE  Revisions to Treatment Plan:  New eval    Continued Need for Acute Rehabilitation Level of Care: The patient requires daily medical management by a physician with specialized training in physical medicine and rehabilitation for the  following conditions: Daily direction of a multidisciplinary physical rehabilitation program to ensure safe treatment while eliciting the highest outcome that is of practical value to the patient.: Yes Daily medical management of patient stability for increased activity during participation in an intensive rehabilitation regime.: Yes Daily analysis of laboratory values and/or radiology reports with any subsequent need for medication adjustment of medical intervention for : Neurological problems;Urological problems;Other   Rebecca [name], Gardiner Rhyme  05/19/2016, 9:38 AM       Patient ID: Rebecca [name], female   DOB: 11-23-1960, 56 y.o.   MRN: [id number]

## 2016-05-25 NOTE — Patient Care Conference (Signed)
Inpatient RehabilitationTeam Conference and Plan of Care Update Date: 05/25/2016   Time: 2:20 PM    Patient Name: Rebecca [name]      Medical Record Number: [id number]  Date of Birth: 1960/04/29 Sex: Female         Room/Bed: 4W23C/4W23C-01 Payor Info: Payor: MEDICARE / Plan: MEDICARE PART A AND B / Product Type: *No Product type* /    Admitting Diagnosis: Ms Exacerbation  Admit Date/Time:  05/17/2016  1:33 PM Admission Comments: No comment available   Primary Diagnosis:  <principal problem not specified> Principal Problem: <principal problem not specified>  Patient Active Problem List   Diagnosis Date Noted  . VZV (varicella-zoster virus) infection   . Detrusor and sphincter dyssynergia   . Urinary incontinence   . Seizure disorder ([place])   . Slow transit constipation   . 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 Lymphedema   . Hypoalbuminemia due to protein-calorie malnutrition ([place])   . Acute blood loss anemia   . Multiple sclerosis exacerbation ([place]) 05/17/2016  . Neurogenic bladder 05/17/2016  . Multiple sclerosis ([place]) 05/14/2016  . Encounter for screening for cervical cancer  11/27/2015  . Preventative health care 11/25/2015  . History of breast cancer in female 10/28/2015  . Anemia 10/28/2015  . Thrombocytopenia ([place]) 10/28/2015  . Allergic rhinitis 10/28/2015  . IBS (irritable bowel syndrome) 10/28/2015  . Encounter for eye exam 08/25/2015  . Aortic valve disease   . Heart murmur 10/24/2014  . 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 Lymphedema of both lower extremities 10/24/2014  . History of right mastectomy 10/24/2014  . Need for immunization against influenza 10/24/2014  . Medicare annual wellness visit, subsequent 10/24/2014  . MS (multiple sclerosis) ([place]) 09/16/2014  . Hematoma complicating a procedure 03/06/2014  . 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 SOB (shortness of breath) 03/01/2013  . Bilateral lower extremity edema   . Complex partial seizure  disorder ([place]) 06/08/2011  . Herpes zoster 06/08/2011    Expected Discharge Date: Expected Discharge Date: 05/31/16  Team Members Present: Physician leading conference: Dr. Delice Lesch Social Worker Present: Ovidio Kin, LCSW Nurse Present: Rayetta Pigg, RN PT Present: Carney Living, Dustin Folks, PT OT Present: Clyda Greener, OT PPS Coordinator present : Daiva Nakayama, RN, CRRN     Current Status/Progress Goal Weekly Team Focus  Medical   Decreased functional mobility secondary to MS exacerbation   Improve mobility, safety, urinary freq  See above   Bowel/Bladder   patient incontinent of bowel and bladder; LBM 05/21/16  decrease number of incontinent episodes  anticipate patient's need for toileting and assist   Swallow/Nutrition/ Hydration             ADL's   supervision for UB selfcare, min assist for bathing sit to stand with mod to max for dressing tasks  supervision to min assist level  selfcare retraining, balance retraining, transfer training, neuromuscular re-education, pt/family education   Mobility   min-mod A overall  downgraded to min A overall, mod A car transfer  functional mobility, tone management, BLE NMR, standing balance, postural control, pt/family education and training   Communication             Safety/Cognition/ Behavioral Observations            Pain   occasional leg discomfort  <4  assess for pain q shift   Skin   BLE lymphedema, R mastectomy site, porta cath left chest; rash to lower buttocks  pt will be  free of infection and skin breakdown  skin assessment q shift      *See Care Plan and progress notes for long and short-term goals.  Barriers to Discharge: Neurogenic bladder, thrombocytopenia, safety, mobility, VZV, urinary freq    Possible Resolutions to Barriers:  Therapies, follow labs, adjust bladder meds, antivirals    Discharge Planning/Teaching Needs:  Home with husband who has been here to participate in therapies. Husband  can provide care to her he was prior to admission      Team Discussion:  Progressing toward her goals-ambulation goals downgraded to min assist level. MD monitoring labs and treating for shingles. Meds for urinary frequency are working and pt slept better last night. Husband has started training for discharge.  Revisions to Treatment Plan:  Downgraded ambulation to min assist. DC 4/24   Continued Need for Acute Rehabilitation Level of Care: The patient requires daily medical management by a physician with specialized training in physical medicine and rehabilitation for the following conditions: Daily direction of a multidisciplinary physical rehabilitation program to ensure safe treatment while eliciting the highest outcome that is of practical value to the patient.: Yes Daily medical management of patient stability for increased activity during participation in an intensive rehabilitation regime.: Yes Daily analysis of laboratory values and/or radiology reports with any subsequent need for medication adjustment of medical intervention for : Neurological problems;Urological problems;Other;Wound care problems  [name] 05/25/2016, 3:09 PM

## 2016-05-25 NOTE — Progress Notes (Signed)
Social Work Patient ID: Rebecca [name], female   DOB: 1960/07/09, 56 y.o.   MRN: [id number] Met with pt to discuss team conference and update. Pt is pleased with her progress while here and her husband has Been here to begin family education. Pt is hopeful she will get better with her ambulation while here until Tuesday. Will work toward discharge Tuesday.

## 2016-05-25 NOTE — Progress Notes (Signed)
[facility]ICAL [facility]IC[facility] & REHABILITATION     PROGRESS NOTE  Subjective/Complaints:  Pt seen laying in bed this AM.  She states she slept better overnight.  Her nocturia is improving  ROS: Denies CP, SOB, N/V/D.  Objective: Vital Signs: Blood pressure (!) 109/57, pulse 71, temperature 97.8 F (36.6 C), temperature source Oral, resp. rate 18, height 5\' 4"  (1.626 m), weight 87.5 kg (193 lb), SpO2 97 %. No results found.  Recent Labs  05/24/16 0539  WBC 3.8*  HGB 12.3  HCT 37.1  PLT 111*    Recent Labs  05/24/16 0539  NA 138  K 3.5  CL 102  GLUCOSE 110*  BUN 10  CREATININE 0.48  CALCIUM 9.1   CBG (last 3)  No results for input(s): GLUCAP in the last 72 hours.  Wt Readings from Last 3 Encounters:  05/20/16 87.5 kg (193 lb)  05/14/16 79.4 kg (175 lb)  11/25/15 77.1 kg (170 lb)    Physical Exam:  BP (!) 109/57 (BP Location: Left Arm)   Pulse 71   Temp 97.8 F (36.6 C) (Oral)   Resp 18   Ht 5\' 4"  (1.626 m)   Wt 87.5 kg (193 lb)   SpO2 97%   BMI 33.13 kg/m  Gen: Well developed. NAD. Vital signs reviewed. Head: Normocephalic. Atraumatic. Eyes: EOMI. No discharge Cardiovascular: RRR. No JVD. Left chest wall port.  Respiratory: CTA B. Unlabored. GI: Soft. Bowel sounds are normal.  Musc: Edema b/l LE. Right heel cord contracture Neurological. Alert and oriented  Fair awareness of deficits.  Motor: RUE: 4-/5 proximal to distal (stable) LUE: 4+/5 proximal to distal RLE: 3/5 HF, KE, 4-/5 ADF/PF LLE: 4-/5 HF, KE, 4-/5 ADF/PF Skin. Chronic lymphedema lower extremities with dressing over both legs.  Blistering lesion on buttock, not examined today Psych: pleasant and cooperative  Assessment/Plan: 1. Functional deficits secondary to MS exacerbation which require 3+ hours per day of interdisciplinary therapy in a comprehensive inpatient rehab setting. Physiatrist is providing close team supervision and 24 hour management of active medical problems listed  below. Physiatrist and rehab team continue to assess barriers to discharge/monitor patient progress toward functional and medical goals.  Function:  Bathing Bathing position   Position: Wheelchair/chair at sink  Bathing parts Body parts bathed by patient: Right arm, Left arm, Chest, Abdomen, Right upper leg, Left upper leg, Back Body parts bathed by helper: Front perineal area, Buttocks  Bathing assist        Upper Body Dressing/Undressing Upper body dressing   What is the patient wearing?: Pull over shirt/dress     Pull over shirt/dress - Perfomed by patient: Thread/unthread right sleeve, Thread/unthread left sleeve, Put head through opening, Pull shirt over trunk          Upper body assist Assist Level: Supervision or verbal cues      Lower Body Dressing/Undressing Lower body dressing   What is the patient wearing?: Pants     Pants- Performed by patient: Thread/unthread right pants leg Pants- Performed by helper: Thread/unthread left pants leg, Pull pants up/down   Non-skid slipper socks- Performed by helper: Don/doff right sock, Don/doff left sock                  Lower body assist Assist for lower body dressing: Touching or steadying assistance (Pt > 75%)      Toileting Toileting Toileting activity did not occur: No continent bowel/bladder event Toileting steps completed by patient: Adjust clothing prior to toileting, Adjust  clothing after toileting, Performs perineal hygiene Toileting steps completed by helper: Adjust clothing prior to toileting, Performs perineal hygiene, Adjust clothing after toileting Toileting Assistive Devices: Grab bar or rail  Toileting assist Assist level: Touching or steadying assistance (Pt.75%)   Transfers Chair/bed transfer Chair/bed transfer activity did not occur: Safety/medical concerns Chair/bed transfer method: Stand pivot Chair/bed transfer assist level: Moderate assist (Pt 50 - 74%/lift or lower) Chair/bed transfer  assistive device: Armrests, Walker Mechanical lift: Ecologist     Max distance: 15 ft Assist level: Touching or steadying assistance (Pt > 75%)   Wheelchair   Type: Manual Max wheelchair distance: 100 ft Assist Level: Touching or steadying assistance (Pt > 75%)  Cognition Comprehension Comprehension assist level: Follows basic conversation/direction with no assist  Expression Expression assist level: Expresses basic needs/ideas: With extra time/assistive device  Social Interaction Social Interaction assist level: Interacts appropriately with others with medication or extra time (anti-anxiety, antidepressant).  Problem Solving Problem solving assist level: Solves basic 75 - 89% of the time/requires cueing 10 - 24% of the time  Memory Memory assist level: Recognizes or recalls 75 - 89% of the time/requires cueing 10 - 24% of the time    Medical Problem List and Plan: 1. Decreased functional mobilitysecondary to MS exacerbation/UTI. Continue interferon as directed  Cont CIR therapies 2. DVT Prophylaxis/Anticoagulation: Subcutaneous Lovenox. Monitor for any bleeding episodes 3. Pain Management/chronic pain:Baclofen 20 mg 3 times a day, Duragesic patch 50 g, oxycodone as needed 4. Mood: Provide emotional support 5. Neuropsych: This patient iscapable of making decisions on herown behalf. 6. Skin/Wound Care: Routine skin care bilateral lower extremities 7. Fluids/Electrolytes/Nutrition: Routine I&Os  BMP within acceptable rage 4/17 8.UTI/neurogenic bladder/DSD. Completecourse of Keflex.   Pt reports very overactive bladder at home  ContinueTimed voiding, oob to void, double voids  Flomax started 4/16  Lasix 20mg  qhs started 4/16, changed to daily  Ditropan 5mg  qhs started 4/17  Improving per pt 9.Seizure disorder. Keppra 500 mg twice a day 10.Constipation. 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  (tension PRAFO RLE---needs to be taut---instructed pt/RN)  aggressive ROM with therapy  Will consider Botox as outpt 12. Chronic lymphedema--continue compression wraps. Pt has Unna boots at home which husband will bring  Perry consulted 26. Mild hypoalbuminemia  Cont to monitor 14. ABLA  Hb 12.3 on 4/17  Cont to monitor 15. Thrombocytopenia  Plts 111 on 4/17  Cont to monitor 16. Constipation  Increased bowel reg 4/13, again 4/16 17. VZV  Valcyclovir started 4/16-4/23  LOS (Days) 8 A FACE TO FACE EVALUATION WAS PERFORMED  Ankit Lorie Phenix 05/25/2016 9:00 AM

## 2016-05-25 NOTE — Progress Notes (Signed)
Physical Therapy Weekly Progress Note  Patient Details  Name: Rebecca [name] MRN: [id number] Date of Birth: 1961/01/15  Beginning of progress report period: May 18, 2016 End of progress report period: May 25, 2016  Today's Date: 05/25/2016 PT Individual Time: 1005-1100 and 1435-1500 PT Individual Time Calculation (min): 55 min and 25 min  Patient has met 1 of 4 short term goals. Patient making slow progress this reporting period. She remains limited by R LE tone and R plantarflexor contracture limiting all functional mobility, BLE lymphedema, weakness, and decreased activity tolerance. Initiated family training with husband with plan to practice actual car transfer prior to DC. Patient requires encouragement and education to advocate for needs and energy conservation.   Patient continues to demonstrate the following deficits muscle weakness and muscle joint tightness, decreased cardiorespiratoy endurance, impaired timing and sequencing, abnormal tone and unbalanced muscle activation, decreased problem solving and delayed processing and decreased standing balance, decreased postural control and decreased balance strategies and therefore will continue to benefit from skilled PT intervention to increase functional independence with mobility.  Patient not progressing toward long term goals.  See goal revision.  Plan of care revisions: downgraded to min A overall and mod A for car transfers.  PT Short Term Goals Week 1:  PT Short Term Goal 1 (Week 1): Patient will perform bed mobility with consistent min A using hospital bed functions. PT Short Term Goal 1 - Progress (Week 1): Not met PT Short Term Goal 2 (Week 1): Patient will perform functional transfers with consistent min A.  PT Short Term Goal 2 - Progress (Week 1): Not met PT Short Term Goal 3 (Week 1): Patient will propel wheelchair using BUE x 75 ft with supervision.  PT Short Term Goal 3 - Progress (Week 1): Not met PT Short Term Goal  4 (Week 1): Patient will ambulate 15 ft using RW with min A.  PT Short Term Goal 4 - Progress (Week 1): Met Week 2:  PT Short Term Goal 1 (Week 2): = LTGs due to anticipated LOS  Skilled Therapeutic Interventions/Progress Updates:   Treatment 1: Patient in wheelchair upon arrival. Patient reports being premedicated for RLE pain with 9/10 pain at beginning of session. Patient propelled wheelchair using BUE x 75 ft with supervision and intermittent hand over hand assist for efficient propulsion technique for turns and increased time. Sit <> stand transfers using RW with mod-max A for anterior weight shift over BOS and assist to move RLE back under patient. Performed Dynavision mode A for 2 minute trials in standing with first 4 rings only: 70 hits with 1.71 sec avg reaction time using RUE only, 43 hits with 2.79 sec avg reaction time using R and L UE, and 62 hits with 1.79 sec avg reaction time. Patient required min A overall for standing balance with single UE support due to posterior LOB. Added shoe lift to L shoe and donned tennis shoes. Gait training using RW x 40 ft at slow pace with supervision with patient demonstrating improved R knee flexion and improved RLE clearance with decreased circumduction with shoe lift. Patient would benefit from shoe lift on L and leather toe cap on R shoe for improved energy conservation and and gait mechanics. Patient reporting no RLE pain at end of session. Patient left sitting in wheelchair with all needs within reach.   Treatment 2: Patient in wheelchair upon arrival reporting fatigue. Performed sit <> stand transfers using RW with mod A for anterior weight shift while  patient brings hands from wheelchair to RW. Patient remained standing using RW with supervision to take vitals and give dinner order. Patient ambulated 25 ft using RW with supervision before fatiguing and requesting to sit. Performed sit <> stand in front of mirror to work on postural control and trunk/hip  extension to neutral before patient reporting urinary urgency then immediately reporting incontinent episode. Utilized Stedy to transfer to elevated commode over toilet and to allow patient to doff clothing with supervision. Patient left sitting on commode with RN aware of patient location.     Therapy Documentation Precautions:  Precautions Precautions: Fall Precaution Comments: R PF contracture Restrictions Weight Bearing Restrictions: No Pain: Pain Assessment Pain Assessment: 0-10 Pain Score: 9 Pain Type: Acute pain Pain Location: Leg Pain Orientation: Right;Posterior Pain Descriptors / Indicators: Aching Pain Onset: On-going Pain Intervention(s): Rest, ambulation, repositioned Multiple Pain Sites: No  See Function Navigator for Current Functional Status.  Therapy/Group: Individual Therapy  Lathaniel Legate, Murray Hodgkins 05/25/2016, 12:15 PM

## 2016-05-25 NOTE — Progress Notes (Signed)
Occupational Therapy Session Note  Patient Details  Name: Rebecca [name] MRN: [id number] Date of Birth: Jul 05, 1960  Today's Date: 05/25/2016 OT Individual Time: 1300-1400 OT Individual Time Calculation (min): 60 min    Short Term Goals: Week 2:  OT Short Term Goal 1 (Week 2): Continue working on established LTGs set at supervision to min assist level.    Skilled Therapeutic Interventions/Progress Updates:    Had pt roll her wheelchair down to the therapy gym with min assist and increased time.  Transferred to the therapy mat with min assist stand pivot with the RW.  Worked on sit to stand from the therapy mat with min assist using the RW.  She needs assistance for standing balance while incorporating functional reaching with BUEs to incorporate upright posture and lumbar and hip extension.  Min assist for upright reaching in standing.  Occasional min assist for sit to stand and balance while reaching.  Worked in sidelying on scapular mobilizations as well to both sides.  Finished session with anterior/posterior mobilizations in sitting with scapular adduction.  Returned to room at end of session with pt left in wheelchair with call button and phone in reach.      Therapy Documentation Precautions:  Precautions Precautions: Fall Precaution Comments: R PF contracture Restrictions Weight Bearing Restrictions: No  Pain: Pain Assessment Pain Assessment: No/denies pain Pain Score: 8  Pain Type: Acute pain Pain Location: Leg Pain Orientation: Right;Posterior Pain Descriptors / Indicators: Aching Pain Onset: On-going Pain Intervention(s): Medication (See eMAR) Multiple Pain Sites: No ADL: See Function Navigator for Current Functional Status.   Therapy/Group: Individual Therapy  Seniya Stoffers OTR/L 05/25/2016, 2:08 PM

## 2016-05-25 NOTE — Progress Notes (Signed)
Occupational Therapy Session Note  Patient Details  Name: [name] MRN: [id number] Date of Birth: 05/18/1960  Today's Date: 05/25/2016 OT Individual Time: 7579-7282 OT Individual Time Calculation (min): 59 min    Short Term Goals: Week 2:  OT Short Term Goal 1 (Week 2): Continue working on established LTGs set at supervision to min assist level.    Skilled Therapeutic Interventions/Progress Updates:    Pt completed bathing and dressing sit to stand at the sink.  Mod assist for sit to stand intervals secondary to limitations in knee flexion bilaterally and being able to shift weight forward over her feet.  Increased LOB posteriorly in standing, when attempting to apply soap to washcloth or standing without UE support.  Utilized LH sponge for washing back as well as reacher for use with unthreading clothing and donning new brief and pants.  Mod assist for sequencing reacher with increased time needed as well for processing.  Pt completed grooming tasks in sitting to finish session.  Pt left with call button and phone in reach.    Therapy Documentation Precautions:  Precautions Precautions: Fall Precaution Comments: R PF contracture Restrictions Weight Bearing Restrictions: No   Pain: Pain Assessment Pain Assessment: No/denies pain ADL: See Function Navigator for Current Functional Status.   Therapy/Group: Individual Therapy  Brianny Soulliere OTR/L 05/25/2016, 8:59 AM

## 2016-05-26 ENCOUNTER — Inpatient Hospital Stay (HOSPITAL_COMMUNITY): Payer: Medicare Other | Admitting: Physical Therapy

## 2016-05-26 ENCOUNTER — Inpatient Hospital Stay (HOSPITAL_COMMUNITY): Payer: Medicare Other | Admitting: Occupational Therapy

## 2016-05-26 DIAGNOSIS — K592 Neurogenic bowel, not elsewhere classified: Secondary | ICD-10-CM

## 2016-05-26 DIAGNOSIS — G808 Other cerebral palsy: Secondary | ICD-10-CM

## 2016-05-26 DIAGNOSIS — B029 Zoster without complications: Secondary | ICD-10-CM

## 2016-05-26 MED ORDER — SENNOSIDES-DOCUSATE SODIUM 8.6-50 MG PO TABS
2.0000 | ORAL_TABLET | Freq: Two times a day (BID) | ORAL | Status: DC
  Administered 2016-05-26 – 2016-06-02 (×12): 2 via ORAL
  Filled 2016-05-26 (×15): qty 2

## 2016-05-26 NOTE — Progress Notes (Signed)
Physical Therapy Session Note  Patient Details  Name: Rebecca [name] MRN: [id number] Date of Birth: March 27, 1960  Today's Date: 05/26/2016 PT Individual Time: 1100-1157 and 6203-5597 PT Individual Time Calculation (min): 57 min and 70 min  Short Term Goals: Week 2:  PT Short Term Goal 1 (Week 2): = LTGs due to anticipated LOS  Skilled Therapeutic Interventions/Progress Updates:   Treatment 1: Patient in wheelchair with shoes and L shoe lift donned. Patient reporting possible need to void and agreeable to attempt ambulation to bathroom. Patient performed sit <> stand with 2 attempts and max A with increased difficulty bringing hand from arm rest up to RW and ambulated approx 2 steps before becoming tearful and stating, "I can't move." Chair brought up behind patient. Patient propelled wheelchair from room to gym using BUE with max multimodal cues for efficient propulsion and turning with supervision. Performed NuStep using BUE/BLE at level 3 for BLE NMR and reciprocal pattern retraining with cues to keep steps per min > 40. Patient completed remaining transfers with mod A for sit > stand to facilitate anterior weight shift. Gait training using RW x 32 ft with close supervision and increased time. Patient left sitting in wheelchair with all needs within reach.   Treatment 2: Patient in wheelchair upon arrival, c/o 8-9/10 pain in RLE. Sit > stand with mod A for anterior weight shift to achieve full stand. Gait training using RW with orthotist Gerald Stabs from [place] present to evaluate patient with recommendation for approx .5 in shoe lift on L shoe and .25 in heel wedge on R shoe and leather toe caps on B shoes to allow for improved B foot clearance, decreased R knee hyperextension in stance phase, and improved RLE hip/knee flexion and decreased R circumduction. Patient ambulated 35 ft + 20 ft using RW with supervision and verbal/tactile cues for upright posture and forward gaze. Performed kinetron seated  progressed to standing with manual facilitation for weight shifting to R to promote lateral weight shifting, BLE NMR, and standing postural control with mirror for visual feedback to promote upright posture and forward gaze. Provided seated rest breaks as needed due to fatigue. Patient with heavy dependence on BUE support to complete activity. Patient propelled wheelchair back to room with distant supervision and min cues for technique with extra time. Stand pivot transfer wheelchair > bed using RW with mod A lifting assistance and verbal cues for sequencing turning with RW. Patient left sitting EOB with nurse tech present.   Therapy Documentation Precautions:  Precautions Precautions: Fall Precaution Comments: R PF contracture Restrictions Weight Bearing Restrictions: No Pain: Pain Assessment Pain Assessment: 0-10 Pain Score: 8  Pain Type: Acute pain Pain Location: Leg Pain Orientation: Right Pain Descriptors / Indicators: Aching Pain Onset: On-going Patients Stated Pain Goal: 8 Pain Intervention(s): RN made aware;Ambulation/increased activity   See Function Navigator for Current Functional Status.   Therapy/Group: Individual Therapy  Hula Tasso, Murray Hodgkins 05/26/2016, 12:55 PM

## 2016-05-26 NOTE — Progress Notes (Addendum)
[place] PHYSICAL MEDICINE & REHABILITATION     PROGRESS NOTE  Subjective/Complaints:  Pt seen laying in bed this AM.  She slept well overnight.  She notes good improvement with her bladder function and does not wish to make any further adjustments at present.   ROS: Denies CP, SOB, N/V/D.  Objective: Vital Signs: Blood pressure (!) 109/59, pulse 85, temperature 97.9 F (36.6 C), temperature source Oral, resp. rate 19, height 5\' 4"  (1.626 m), weight 87.5 kg (193 lb), SpO2 96 %. No results found.  Recent Labs  05/24/16 0539  WBC 3.8*  HGB 12.3  HCT 37.1  PLT 111*    Recent Labs  05/24/16 0539  NA 138  K 3.5  CL 102  GLUCOSE 110*  BUN 10  CREATININE 0.48  CALCIUM 9.1   CBG (last 3)  No results for input(s): GLUCAP in the last 72 hours.  Wt Readings from Last 3 Encounters:  05/20/16 87.5 kg (193 lb)  05/14/16 79.4 kg (175 lb)  11/25/15 77.1 kg (170 lb)    Physical Exam:  BP (!) 109/59 (BP Location: Left Arm)   Pulse 85   Temp 97.9 F (36.6 C) (Oral)   Resp 19   Ht 5\' 4"  (1.626 m)   Wt 87.5 kg (193 lb)   SpO2 96%   BMI 33.13 kg/m  Gen: Well developed. NAD. Vital signs reviewed. Head: Normocephalic. Atraumatic. Eyes: EOMI. No discharge Cardiovascular: RRR. No JVD. Left chest wall port.  Respiratory: CTA B. Unlabored. GI: Soft. Bowel sounds are normal.  Musc: Edema b/l LE. Right heel cord contracture Neurological. Alert and oriented  Fair awareness of deficits.  Motor: RUE: 4-/5 proximal to distal (unchanged) LUE: 4+/5 proximal to distal RLE: 3/5 HF, KE, 4-/5 ADF/PF LLE: 4-/5 HF, KE, 4-/5 ADF/PF Skin. Chronic lymphedema lower extremities with dressing over both legs.  Blistering lesion on buttock, not examined today Psych: pleasant and cooperative  Assessment/Plan: 1. Functional deficits secondary to MS exacerbation which require 3+ hours per day of interdisciplinary therapy in a comprehensive inpatient rehab setting. Physiatrist is providing close  team supervision and 24 hour management of active medical problems listed below. Physiatrist and rehab team continue to assess barriers to discharge/monitor patient progress toward functional and medical goals.  Function:  Bathing Bathing position   Position: Wheelchair/chair at sink  Bathing parts Body parts bathed by patient: Right arm, Left arm, Chest, Abdomen, Right upper leg, Left upper leg, Back Body parts bathed by helper: Front perineal area, Buttocks  Bathing assist        Upper Body Dressing/Undressing Upper body dressing   What is the patient wearing?: Pull over shirt/dress     Pull over shirt/dress - Perfomed by patient: Thread/unthread right sleeve, Thread/unthread left sleeve, Put head through opening, Pull shirt over trunk          Upper body assist Assist Level: Supervision or verbal cues      Lower Body Dressing/Undressing Lower body dressing   What is the patient wearing?: Pants     Pants- Performed by patient: Thread/unthread right pants leg Pants- Performed by helper: Thread/unthread left pants leg, Pull pants up/down   Non-skid slipper socks- Performed by helper: Don/doff right sock, Don/doff left sock                  Lower body assist Assist for lower body dressing: Touching or steadying assistance (Pt > 75%)      Toileting Toileting Toileting activity did not occur: No  continent bowel/bladder event Toileting steps completed by patient: Adjust clothing prior to toileting, Adjust clothing after toileting, Performs perineal hygiene Toileting steps completed by helper: Adjust clothing prior to toileting, Performs perineal hygiene, Adjust clothing after toileting Toileting Assistive Devices: Grab bar or rail  Toileting assist Assist level: Touching or steadying assistance (Pt.75%)   Transfers Chair/bed transfer Chair/bed transfer activity did not occur: Safety/medical concerns Chair/bed transfer method: Ambulatory Chair/bed transfer assist  level: Moderate assist (Pt 50 - 74%/lift or lower) Chair/bed transfer assistive device: Armrests, Walker Mechanical lift: Ecologist     Max distance: 40 ft Assist level: Supervision or verbal cues   Wheelchair   Type: Manual Max wheelchair distance: 75 ft Assist Level: Supervision or verbal cues  Cognition Comprehension Comprehension assist level: Follows basic conversation/direction with no assist  Expression Expression assist level: Expresses basic 90% of the time/requires cueing < 10% of the time.  Social Interaction Social Interaction assist level: Interacts appropriately 90% of the time - Needs monitoring or encouragement for participation or interaction.  Problem Solving Problem solving assist level: Solves basic 75 - 89% of the time/requires cueing 10 - 24% of the time  Memory Memory assist level: Recognizes or recalls 75 - 89% of the time/requires cueing 10 - 24% of the time    Medical Problem List and Plan: 1. Decreased functional mobilitysecondary to MS exacerbation/UTI. Continue interferon as directed  Cont CIR therapies 2. DVT Prophylaxis/Anticoagulation: Subcutaneous Lovenox. Monitor for any bleeding episodes 3. Pain Management/chronic pain:Baclofen 20 mg 3 times a day, Duragesic patch 50 g, oxycodone as needed 4. Mood: Provide emotional support 5. Neuropsych: This patient iscapable of making decisions on herown behalf. 6. Skin/Wound Care: Routine skin care bilateral lower extremities 7. Fluids/Electrolytes/Nutrition: Routine I&Os  BMP within acceptable rage 4/17 8.Neurogenic bladder/DSD. Completecourse of Keflex.   Pt reports very overactive bladder at home  ContinueTimed voiding, oob to void  Flomax started 4/16  Lasix 20mg  qhs started 4/16, changed to daily  Ditropan 5mg  qhs started 4/17  Improving 9.Seizure disorder. Keppra 500 mg twice a day 10.Constipation. 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 (tension PRAFO RLE---needs to be taut---instructed pt/RN)  aggressive ROM with therapy  Will consider Botox as outpt 12. Chronic lymphedema--continue compression wraps. Pt has Unna boots at home which husband will bring  [place] consulted 86. Mild hypoalbuminemia  Cont to monitor 14. ABLA  Hb 12.3 on 4/17  Cont to monitor 15. Thrombocytopenia  Plts 111 on 4/17  Cont to monitor 16. Constipation/Neurogenic bowel  Increased bowel reg 4/13, again 4/16, again 4/19 17. Herpes Zoster  Valcyclovir started 4/16-4/23  LOS (Days) 9 A FACE TO FACE EVALUATION WAS PERFORMED  Ankit Lorie Phenix 05/26/2016 10:28 AM

## 2016-05-26 NOTE — Progress Notes (Signed)
Occupational Therapy Session Note  Patient Details  Name: Rebecca [name] MRN: [id number] Date of Birth: 1960/02/18  Today's Date: 05/26/2016 OT Individual Time: 0930-1030 OT Individual Time Calculation (min): 60 min    Short Term Goals: Week 1:  OT Short Term Goal 1 (Week 1): Pt will complete LB bathing sit to stand with min assist for 2 consecutive sessions.  OT Short Term Goal 1 - Progress (Week 1): Not met OT Short Term Goal 2 (Week 1): Pt will complete LB dressing sit to stand with min assist using AE for donning clothing over feet.   OT Short Term Goal 2 - Progress (Week 1): Not met OT Short Term Goal 3 (Week 1): Pt will complete toilet transfer with min assist stand pivot to 3:1 with use of the RW.   OT Short Term Goal 3 - Progress (Week 1): Not met OT Short Term Goal 4 (Week 1): Pt will maintain standing for greater 3 mins with min assist during bathing or grooming tasks.  OT Short Term Goal 4 - Progress (Week 1): Met Week 2:  OT Short Term Goal 1 (Week 2): Continue working on established LTGs set at supervision to min assist level.    Skilled Therapeutic Interventions/Progress Updates:    Pt seen this session for ADL training with a focus on sit to stand and standing tolerance along with use of AE.  Pt received in  Bed and sat to EOB with mod A.  Pt needed extra time to move legs off bed and needed A with R Leg.  Extra time and mod-max A with sit to stand to RW. Used RW to pivot to w/c. At sink she could stand up with mod A but often needed assist to adjust R leg.  Continues to need A with donning pants with reacher as she has difficulty lifting legs.  Completed UB care with set up.  Pt resting in w/c with all needs met.  Therapy Documentation Precautions:  Precautions Precautions: Fall Precaution Comments: R PF contracture Restrictions Weight Bearing Restrictions: No  Pain: Pain Assessment Pain Assessment: 0-10 Pain Score: 8  Pain Type: Chronic pain Pain Location:  Leg Pain Orientation: Right;Left;Posterior Pain Descriptors / Indicators: Aching Pain Frequency: Intermittent Pain Onset: Gradual Patients Stated Pain Goal: 2 Pain Intervention(s): Medication (See eMAR);Repositioned   ADL:  See Function Navigator for Current Functional Status.   Therapy/Group: Individual Therapy  SAGUIER,JULIA 05/26/2016, 8:29 AM

## 2016-05-27 ENCOUNTER — Inpatient Hospital Stay (HOSPITAL_COMMUNITY): Payer: Medicare Other | Admitting: Occupational Therapy

## 2016-05-27 ENCOUNTER — Inpatient Hospital Stay (HOSPITAL_COMMUNITY): Payer: Medicare Other | Admitting: Physical Therapy

## 2016-05-27 MED ORDER — WHITE PETROLATUM GEL
Status: AC
  Administered 2016-05-27: 10:00:00
  Filled 2016-05-27: qty 1

## 2016-05-27 NOTE — Progress Notes (Signed)
Physical Therapy Session Note  Patient Details  Name: Rebecca [name] MRN: [id number] Date of Birth: August 31, 1960  Today's Date: 05/27/2016 PT Individual Time: 1105-1200 and 1300-1408 PT Individual Time Calculation (min): 55 min and 68 min  Short Term Goals: Week 2:  PT Short Term Goal 1 (Week 2): = LTGs due to anticipated LOS  Skilled Therapeutic Interventions/Progress Updates:   Treatment 1: Patient in wheelchair upon arrival with modified shoes with B, leather toe caps, shoe lift L, and heel wedge R returned from orthotist. Propelled wheelchair using BUE x 150 ft with supervision and greatly increased time with max verbal/visual cues for increased excursion on wheelchair rim to improve efficiency and conserve energy. Sit <> stand from wheelchair with mod A for anterior weight shift when patient bringing hand from arm rest to walker. Gait training using RW x 35 ft with supervision and increased time with verbal cues for forward gaze to promote upright posture. 10 Meter Walk Test using RW = 0.043 m/s. Initiated stair training for BLE NMR and endurance: up/down 3 (3") stairs + 4 (3") stairs using 2 rails, ascending forward leading with LLE and descending backward leading with RLE. Patient requires min A to clear step with RLE or circumducts to advance limb. Patient required seated rest between trials. To challenge R knee control and facilitate closed chain hip/knee flexion, standing with BUE support on rails, performed step ups/step downs using LLE only with RLE on 3" step. Patient required manual facilitation to decrease R knee hyperextension and no active knee flexion noted with task. To promote increased RLE WB, performed step taps to 3"step using LLE but patient with heavy dependence on BUE support to complete task. Patient continues to require assist to place BLE under her in preparation for sit <> stand due to decreased knee flexion AROM and PF contracture. Patient left sitting in wheelchair with all  needs within reach.   Treatment 2: Patient's husband present for practicing actual car transfer. Patient on commode upon arrival. Rebecca [name] for sit <> stand with min A with assist for clothing management and hygiene. Patient washed hands from [facility] via sit <> stand with supervision. Outside, practiced actual car transfer to husband's low [place] using patient's personal RW one trial with PT assisting and second trial with patient's husband assisting. Patient performed stand pivot transfer using RW to car with supervision-steady assist, mod A to lift BLE in/out of car, and max A for sit > stand from low surface with cues to scoot forward and place BLE under her. Patient fearful with sit <> stand with increased dependence on B hip adduction/valgus positioning to achieve standing with max A and multiple attempts. Recommending that patient's husband purchase gait belt to aide in transfers. Patient's personal RW assessed for correct height. Patient and husband both report feeling comfortable with transfers and level of assist that will be required at discharge. Patient left sitting in wheelchair with all needs within reach.   Therapy Documentation Precautions:  Precautions Precautions: Fall Precaution Comments: R PF contracture Restrictions Weight Bearing Restrictions: No Pain: Pain Assessment Pain Assessment: 0-10 Pain Score: 8  Pain Type: Acute pain Pain Location: Leg Pain Orientation: Right Pain Descriptors / Indicators: Aching Pain Onset: On-going Pain Intervention(s): RN made aware;Repositioned;Rest  See Function Navigator for Current Functional Status.   Therapy/Group: Individual Therapy  Rebecca [name], Rebecca [name] 05/27/2016, 12:20 PM

## 2016-05-27 NOTE — Progress Notes (Signed)
Air mattress obtained and placed in room.

## 2016-05-27 NOTE — Progress Notes (Addendum)
Occupational Therapy Session Note  Patient Details  Name: Rebecca [name] MRN: [id number] Date of Birth: 19-May-1960  Today's Date: 05/27/2016 OT Individual Time: 1884-1660 OT Individual Time Calculation (min): 61 min       Skilled Therapeutic Interventions/Progress Updates: Skilled OT session completed with focus on adaptive bathing/dressing skills, standing balance/endurance, and functional transfers. Pt was lying in bed, just finished having brief changed by nurse tech at time of arrival. She requested to complete bathing at sink. Pt able to transition to EOB with close supervision today. Stand pivot transfer with RW completed to w/c with Mod A and extra time for lifting R LE. Once in w/c pt bathed with overall Min A for pericare (and placement of R LE prior to all sit<stands). RN notified regarding small open wound above buttocks. Pt cued for upright posture when standing during ADLs due to her tendency to be forward flexed and supported on elbows. Pants threaded with use of reacher. At end of tx pt was left in w/c with all needs within reach.     Discussed lotion options for pts feet with RN. RN provided pt with 2 lotions, advised OT to combine both and apply to her feet during ADL sessions.   Therapy Documentation Precautions:  Precautions Precautions: Fall Precaution Comments: R PF contracture Restrictions Weight Bearing Restrictions: No   Pain: Pt reported that her pain was "much better today" at start of tx  Pain Assessment Pain Assessment: 0-10 Pain Score: 8  Pain Type: Acute pain Pain Location: Leg Pain Orientation: Right Pain Descriptors / Indicators: Aching Pain Onset: On-going Pain Intervention(s): RN made aware;Repositioned;Rest ADL:      See Function Navigator for Current Functional Status.   Therapy/Group: Individual Therapy  Charmagne Buhl A Diane Mochizuki 05/27/2016, 12:26 PM

## 2016-05-27 NOTE — Progress Notes (Signed)
[place] PHYSICAL MEDICINE & REHABILITATION     PROGRESS NOTE  Subjective/Complaints:  Pt  Like to sleep in supine but sore in buttocks area, tried laying on hip but had soreness over hip when sidelying  ROS: Denies CP, SOB, N/V/D.  Objective: Vital Signs: Blood pressure 117/70, pulse 82, temperature 98.9 F (37.2 C), temperature source Oral, resp. rate 17, height 5\' 4"  (1.626 m), weight 87.5 kg (193 lb), SpO2 99 %. No results found. No results for input(s): WBC, HGB, HCT, PLT in the last 72 hours. No results for input(s): NA, K, CL, GLUCOSE, BUN, CREATININE, CALCIUM in the last 72 hours.  Invalid input(s): CO CBG (last 3)  No results for input(s): GLUCAP in the last 72 hours.  Wt Readings from Last 3 Encounters:  05/20/16 87.5 kg (193 lb)  05/14/16 79.4 kg (175 lb)  11/25/15 77.1 kg (170 lb)    Physical Exam:  BP 117/70 (BP Location: Left Arm)   Pulse 82   Temp 98.9 F (37.2 C) (Oral)   Resp 17   Ht 5\' 4"  (1.626 m)   Wt 87.5 kg (193 lb)   SpO2 99%   BMI 33.13 kg/m  Gen: Well developed. NAD. Vital signs reviewed. Head: Normocephalic. Atraumatic. Eyes: EOMI. No discharge Cardiovascular: RRR. No JVD. Left chest wall port.  Respiratory: CTA B. Unlabored. GI: Soft. Bowel sounds are normal.  Musc: Edema b/l LE. Right heel cord contracture Neurological. Alert and oriented  Fair awareness of deficits.  Motor: RUE: 4/5 proximal to distal (unchanged) LUE: 4/5 proximal to distal RLE: 3/5 HF, KE, 3-/5 ADF/PF LLE: 3-/5 HF, KE, 3-/5 ADF/PF Skin. Chronic lymphedema lower extremities with dressing over both legs.  Blistering lesion on buttock,redenss over sacrum Psych: pleasant and cooperative  Assessment/Plan: 1. Functional deficits secondary to MS exacerbation which require 3+ hours per day of interdisciplinary therapy in a comprehensive inpatient rehab setting. Physiatrist is providing close team supervision and 24 hour management of active medical problems listed  below. Physiatrist and rehab team continue to assess barriers to discharge/monitor patient progress toward functional and medical goals.  Function:  Bathing Bathing position   Position: Wheelchair/chair at sink  Bathing parts Body parts bathed by patient: Right arm, Left arm, Chest, Abdomen, Right upper leg, Left upper leg, Front perineal area Body parts bathed by helper: Buttocks, Back  Bathing assist        Upper Body Dressing/Undressing Upper body dressing   What is the patient wearing?: Pull over shirt/dress     Pull over shirt/dress - Perfomed by patient: Thread/unthread right sleeve, Thread/unthread left sleeve, Put head through opening, Pull shirt over trunk          Upper body assist Assist Level: Supervision or verbal cues      Lower Body Dressing/Undressing Lower body dressing   What is the patient wearing?: Pants     Pants- Performed by patient: Pull pants up/down Pants- Performed by helper: Thread/unthread left pants leg, Thread/unthread right pants leg   Non-skid slipper socks- Performed by helper: Don/doff right sock, Don/doff left sock                  Lower body assist Assist for lower body dressing: Touching or steadying assistance (Pt > 75%)      Toileting Toileting Toileting activity did not occur: No continent bowel/bladder event Toileting steps completed by patient: Adjust clothing prior to toileting, Adjust clothing after toileting, Performs perineal hygiene Toileting steps completed by helper: Adjust clothing prior to  toileting, Performs perineal hygiene, Adjust clothing after toileting Toileting Assistive Devices: Grab bar or rail  Toileting assist Assist level: Touching or steadying assistance (Pt.75%)   Transfers Chair/bed transfer Chair/bed transfer activity did not occur: Safety/medical concerns Chair/bed transfer method: Stand pivot Chair/bed transfer assist level: Moderate assist (Pt 50 - 74%/lift or lower) Chair/bed transfer  assistive device: Armrests, Walker Mechanical lift: Ecologist     Max distance: 32 ft Assist level: Supervision or verbal cues   Wheelchair   Type: Manual Max wheelchair distance: 150 ft Assist Level: Supervision or verbal cues  Cognition Comprehension Comprehension assist level: Follows basic conversation/direction with no assist  Expression Expression assist level: Expresses basic 90% of the time/requires cueing < 10% of the time.  Social Interaction Social Interaction assist level: Interacts appropriately 90% of the time - Needs monitoring or encouragement for participation or interaction.  Problem Solving Problem solving assist level: Solves basic 75 - 89% of the time/requires cueing 10 - 24% of the time  Memory Memory assist level: Recognizes or recalls 75 - 89% of the time/requires cueing 10 - 24% of the time    Medical Problem List and Plan: 1. 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 Continue interferon as directed  Cont CIR therapies 2. DVT Prophylaxis/Anticoagulation: Subcutaneous Lovenox. Monitor for any bleeding episodes 3. Pain Management/chronic pain:Baclofen 20 mg 3 times a day, Duragesic patch 50 g, oxycodone as needed 4. Mood: Provide emotional support 5. Neuropsych: This patient iscapable of making decisions on herown behalf. 6. Skin/Wound Care: Routine skin care bilateral lower extremities, stage 1 sacral , has paraparesis needs assist with bed mobility- low pressure mattress recommended, d/w RN 7. Fluids/Electrolytes/Nutrition: Routine I&Os  BMP within acceptable rage 4/17 8.Neurogenic bladder/DSD. Completecourse of Keflex.   Pt reports very overactive bladder at home  ContinueTimed voiding, oob to void  Flomax started 4/16  Lasix 20mg  qhs started 4/16, changed to daily  Ditropan 5mg  qhs started 4/17  Improving 9.Seizure disorder. Keppra 500 mg twice a day 10.Constipation. 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 (tension PRAFO RLE---needs to be taut---instructed pt/RN)  aggressive ROM with therapy  Will consider Botox as outpt 12. Chronic lymphedema--continue compression wraps. Pt has Unna boots at home which husband will bring  [place] consulted 33. Mild hypoalbuminemia  Cont to monitor 14. ABLA  Hb 12.3 on 4/17  Cont to monitor 15. Thrombocytopenia  Plts 111 on 4/17  Cont to monitor 16. Constipation/Neurogenic bowel  Increased bowel reg 4/13, again 4/16, again 4/19 17. Herpes Zoster  Valcyclovir started 4/16-4/23  LOS (Days) 10 A FACE TO FACE EVALUATION WAS PERFORMED  Charlett Blake 05/27/2016 7:37 AM

## 2016-05-28 ENCOUNTER — Inpatient Hospital Stay (HOSPITAL_COMMUNITY): Payer: Medicare Other | Admitting: Occupational Therapy

## 2016-05-28 NOTE — Progress Notes (Signed)
Rebecca [name] is a 56 y.o. female 11-Apr-1960 [id number]  Subjective: No new complaints. Still uncomfortable in bed with subsquent pain in posterior thighs bilateral LE. Resistant to RN suggestion for turning and repositioning No other new problems.   Objective: Vital signs in last 24 hours: Temp:  [97.7 F (36.5 C)-98.2 F (36.8 C)] 98.2 F (36.8 C) (04/21 0433) Pulse Rate:  [87] 87 (04/21 0433) Resp:  [17-18] 18 (04/21 0433) BP: (108-114)/(56-68) 114/68 (04/21 0433) SpO2:  [97 %-100 %] 97 % (04/21 0433) Weight change:  Last BM Date: 05/27/16  Intake/Output from previous day: 04/20 0701 - 04/21 0700 In: 736 [P.O.:716; I.V.:20] Out: -   Physical Exam General: No apparent distress   Supine on airmatress Lungs: Normal effort. Lungs clear to auscultation, no crackles or wheezes. Cardiovascular: Regular rate and rhythm, BLE lymphedema w/o wrap at present Wounds: sacrum not eval by me today - nursing notes reviewed.  Lab Results: BMET    Component Value Date/Time   NA 138 05/24/2016 0539   NA 142 08/06/2015 1615   NA 139 06/05/2014 0951   K 3.5 05/24/2016 0539   K 3.8 06/05/2014 0951   CL 102 05/24/2016 0539   CL 107 06/05/2014 0951   CO2 26 05/24/2016 0539   CO2 28 06/05/2014 0951   GLUCOSE 110 (H) 05/24/2016 0539   GLUCOSE 130 (H) 06/05/2014 0951   BUN 10 05/24/2016 0539   BUN 16 08/06/2015 1615   BUN 12 06/05/2014 0951   CREATININE 0.48 05/24/2016 0539   CREATININE 0.49 (L) 11/25/2015 1216   CALCIUM 9.1 05/24/2016 0539   CALCIUM 9.4 06/05/2014 0951   GFRNONAA >60 05/24/2016 0539   GFRNONAA >89 11/25/2015 1216   GFRAA >60 05/24/2016 0539   GFRAA >89 11/25/2015 1216   CBC    Component Value Date/Time   WBC 3.8 (L) 05/24/2016 0539   RBC 4.08 05/24/2016 0539   HGB 12.3 05/24/2016 0539   HGB 9.4 (L) 06/05/2014 0951   HCT 37.1 05/24/2016 0539   HCT 32.5 (L) 08/06/2015 1615   PLT 111 (L) 05/24/2016 0539   PLT 123 (L) 08/06/2015 1615   MCV 90.9 05/24/2016 0539    MCV 90 08/06/2015 1615   MCV 93 06/05/2014 0951   MCH 30.1 05/24/2016 0539   MCHC 33.2 05/24/2016 0539   RDW 12.6 05/24/2016 0539   RDW 13.3 08/06/2015 1615   RDW 18.7 (H) 06/05/2014 0951   LYMPHSABS 0.8 05/24/2016 0539   LYMPHSABS 1.0 08/06/2015 1615   LYMPHSABS 0.3 (L) 06/05/2014 0951   MONOABS 0.4 05/24/2016 0539   MONOABS 0.3 06/05/2014 0951   EOSABS 0.1 05/24/2016 0539   EOSABS 0.1 08/06/2015 1615   EOSABS 0.1 06/05/2014 0951   BASOSABS 0.0 05/24/2016 0539   BASOSABS 0.0 08/06/2015 1615   BASOSABS 0.0 06/05/2014 0951   CBG's (last 3):  No results for input(s): GLUCAP in the last 72 hours. LFT's Lab Results  Component Value Date   ALT 45 05/18/2016   AST 35 05/18/2016   ALKPHOS 64 05/18/2016   BILITOT 0.5 05/18/2016    Studies/Results: No results found.  Medications:  I have reviewed the patient's current medications. Scheduled Medications: . amantadine  100 mg Oral TID  . baclofen  20 mg Oral TID  . cephALEXin  500 mg Oral Q6H  . enoxaparin (LOVENOX) injection  40 mg Subcutaneous Q24H  . fentaNYL  50 mcg Transdermal Q72H  . furosemide  20 mg Oral Daily  . interferon beta-1a  44  mcg Subcutaneous Once per day on Mon Wed Fri  . levETIRAcetam  500 mg Oral BID  . multivitamin  1 tablet Oral BID  . oxybutynin  5 mg Oral QHS  . polyethylene glycol  17 g Oral BID  . senna-docusate  2 tablet Oral BID  . tamsulosin  0.4 mg Oral QPC breakfast  . valACYclovir  1,000 mg Oral TID   PRN Medications: acetaminophen **OR** acetaminophen, ondansetron **OR** ondansetron (ZOFRAN) IV, oxyCODONE, sodium chloride flush, sorbitol  Assessment/Plan: Active Problems:   Multiple sclerosis exacerbation (HCC)   Seizure disorder (HCC)   Slow transit constipation   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HCC)   Lymphedema   Hypoalbuminemia due to protein-calorie malnutrition (HCC)   Acute blood loss anemia   Urinary incontinence   Detrusor and sphincter dyssynergia   Neurogenic bowel   Herpes  zoster without complication  1. MS exac with functional mobility paraparesis - debility requiring ongoing CIR for support - continue PT/OT as ongoing - meds for spasticity as ongoing 2. Chronic pain - reports unchanged in BLE posterior thighs related to positioning in bed - unchanged with repositioning or air mattress - encouraged to rotate as per RN instructions and continue meds as needed + as scheduled 3. Neurogenic bladder with recent UTI s/p Keflex course; continue bladder rx and monitor symptoms  4. Sz disorder - no breakthru events noted - continue Keppra 5. Chronic lymphedema - continue Unna boots as needed (as PTA) 6. HZV uncomplicated - ongoing antiviral thru weekend as planned   Length of stay, days: 11   [name] A. Asa Lente, MD 05/28/2016, 10:46 AM

## 2016-05-28 NOTE — Progress Notes (Signed)
Pt is concerned that d/c date is approaching and still having incontinent episodes.  She reports husband is concerned as well.  Says that was not incontinent prior to coming to hospital.

## 2016-05-28 NOTE — Progress Notes (Signed)
Occupational Therapy Session Note  Patient Details  Name: Rebecca [name] MRN: [id number] Date of Birth: [birth year]  Today's Date: 05/28/2016 OT Individual Time: 1329-1430 OT Individual Time Calculation (min): 61 min    Short Term Goals: Week 2:  OT Short Term Goal 1 (Week 2): Continue working on established LTGs set at supervision to min assist level.    Skilled Therapeutic Interventions/Progress Updates: Pt was lying on bedpan at time of arrival. Min A rolling R<L for hygiene/donning new brief. She verbalized that she was having "a bad day" due to having to void so frequently. She declined donning clothes due to fear of soiling them. She was agreeable to practice use of sock aide for furthering independence with LB dressing. Pt transitioned to EOB with close supervision. Stand pivot<w/c completed with RW and Mod A, extra time for elevating R LE and assist with weight shifting. Once in w/c, she was escorted to dayroom. She was able to successfully don bariatric socks with extra wide sock aide (to accommodate unna boots) with extra time and instruction. At end of tx, she requested to return to bed. She transferred in manner as written above. Pt able to boost herself up by pulling with bedrails and headboard when placed in trendelenberg position. At end of tx pt was left with all needs within reach.       Therapy Documentation Precautions:  Precautions Precautions: Fall Precaution Comments: R PF contracture Restrictions Weight Bearing Restrictions: No   Pain: Pt reported pain to be manageable with provided rest breaks    ADL:      See Function Navigator for Current Functional Status.   Therapy/Group: Individual Therapy  Rebecca [name] 05/28/2016, 4:24 PM

## 2016-05-28 NOTE — Plan of Care (Signed)
Problem: RH Toilet Transfers Goal: LTG Patient will perform toilet transfers w/assist (OT) LTG: Patient will perform toilet transfers with assist, with/without cues using equipment (OT)  Downgraded due to slow progress

## 2016-05-29 ENCOUNTER — Inpatient Hospital Stay (HOSPITAL_COMMUNITY): Payer: Medicare Other | Admitting: Occupational Therapy

## 2016-05-29 NOTE — Progress Notes (Signed)
Occupational Therapy Session Note  Patient Details  Name: Rebecca [name] MRN: [id number] Date of Birth: 14-Oct-1960  Today's Date: 05/29/2016 OT Individual Time: 6606-0045 OT Individual Time Calculation (min): 45 min    Short Term Goals: Week 1:  OT Short Term Goal 1 (Week 1): Pt will complete LB bathing sit to stand with min assist for 2 consecutive sessions.  OT Short Term Goal 1 - Progress (Week 1): Not met OT Short Term Goal 2 (Week 1): Pt will complete LB dressing sit to stand with min assist using AE for donning clothing over feet.   OT Short Term Goal 2 - Progress (Week 1): Not met OT Short Term Goal 3 (Week 1): Pt will complete toilet transfer with min assist stand pivot to 3:1 with use of the RW.   OT Short Term Goal 3 - Progress (Week 1): Not met OT Short Term Goal 4 (Week 1): Pt will maintain standing for greater 3 mins with min assist during bathing or grooming tasks.  OT Short Term Goal 4 - Progress (Week 1): Met Week 2:  OT Short Term Goal 1 (Week 2): Continue working on established LTGs set at supervision to min assist level.    Skilled Therapeutic Interventions/Progress Updates: Pt was lying on bedpan at time of arrival. She expressed fatigue and concern regarding increased frequency of voiding/incontinence. After pt had brief changed, she reported needing to urinate and requested OT assist with toilet transfer. Then pt became tearful "It's too late- I'm already going." Min A rolling R<L for new brief change. Emotional support/active listening provided by therapist. Afterwards pt was agreeable to complete exercises at EOB. She completed gentle neck and UB stretches with instruction, then required vcs for use of yellow theraband for bilateral UE strengthening. Pt very motivated in tx but expresses concern with going home this week due to bladder issues. At end of tx pt was returned to bed, able to boost herself up with use of bedrails/headboard. She was left with all needs within  reach at time of departure.      Therapy Documentation Precautions:  Precautions Precautions: Fall Precaution Comments: R PF contracture Restrictions Weight Bearing Restrictions: No  Pain: Pt reported pain to be manageable with provided rest breaks Pain Assessment Pain Assessment: 0-10 Pain Score: 9  Pain Type: Acute pain Pain Location: Leg Pain Orientation: Right;Left Pain Descriptors / Indicators: Aching;Spasm Pain Frequency: Constant Pain Onset: On-going Pain Intervention(s): Medication (See eMAR) ADL:      Exercises: Bilateral UE therapeutic exercise with yellow theraband 10 reps 2 sets: shoulder flexion, bicep curls, tricep punches, shoulder protraction/retraction, horizontal abduction      See Function Navigator for Current Functional Status.   Therapy/Group: Individual Therapy  Ondine Gemme A Elonzo Sopp 05/29/2016, 12:29 PM

## 2016-05-29 NOTE — Progress Notes (Signed)
Rebecca [name] is a 56 y.o. female Jan 19, 1961 [id number]  Subjective: Very aggravated with urinary inconstancy and freq - Chronic overactive bladder at home "but not as bad as this" - reports feeling cont urgency - unable to void, then incont and need to be changed - Concerned about DC home in current state. Reports she takes no bladder-specific meds PTA  Objective: Vital signs in last 24 hours: Temp:  [97.7 F (36.5 C)-98.1 F (36.7 C)] 98.1 F (36.7 C) (04/22 0500) Pulse Rate:  [77-90] 77 (04/22 0500) Resp:  [18] 18 (04/21 1900) BP: (118-142)/(56-71) 142/56 (04/22 0500) SpO2:  [96 %-98 %] 98 % (04/22 0500) Weight change:  Last BM Date: 05/28/16  Intake/Output from previous day: 04/21 0701 - 04/22 0700 In: 1030 [P.O.:1020; I.V.:10] Out: 400 [Urine:400]  Physical Exam General: No apparent distress   Supine on airmatress Lungs: Normal effort. Lungs clear to auscultation, no crackles or wheezes. Cardiovascular: Regular rate and rhythm, BLE lymphedema w/o wrap at present Wounds: sacrum not eval by me today - nursing notes reviewed.  Lab Results: BMET    Component Value Date/Time   NA 138 05/24/2016 0539   NA 142 08/06/2015 1615   NA 139 06/05/2014 0951   K 3.5 05/24/2016 0539   K 3.8 06/05/2014 0951   CL 102 05/24/2016 0539   CL 107 06/05/2014 0951   CO2 26 05/24/2016 0539   CO2 28 06/05/2014 0951   GLUCOSE 110 (H) 05/24/2016 0539   GLUCOSE 130 (H) 06/05/2014 0951   BUN 10 05/24/2016 0539   BUN 16 08/06/2015 1615   BUN 12 06/05/2014 0951   CREATININE 0.48 05/24/2016 0539   CREATININE 0.49 (L) 11/25/2015 1216   CALCIUM 9.1 05/24/2016 0539   CALCIUM 9.4 06/05/2014 0951   GFRNONAA >60 05/24/2016 0539   GFRNONAA >89 11/25/2015 1216   GFRAA >60 05/24/2016 0539   GFRAA >89 11/25/2015 1216   CBC    Component Value Date/Time   WBC 3.8 (L) 05/24/2016 0539   RBC 4.08 05/24/2016 0539   HGB 12.3 05/24/2016 0539   HGB 9.4 (L) 06/05/2014 0951   HCT 37.1 05/24/2016 0539   HCT 32.5 (L) 08/06/2015 1615   PLT 111 (L) 05/24/2016 0539   PLT 123 (L) 08/06/2015 1615   MCV 90.9 05/24/2016 0539   MCV 90 08/06/2015 1615   MCV 93 06/05/2014 0951   MCH 30.1 05/24/2016 0539   MCHC 33.2 05/24/2016 0539   RDW 12.6 05/24/2016 0539   RDW 13.3 08/06/2015 1615   RDW 18.7 (H) 06/05/2014 0951   LYMPHSABS 0.8 05/24/2016 0539   LYMPHSABS 1.0 08/06/2015 1615   LYMPHSABS 0.3 (L) 06/05/2014 0951   MONOABS 0.4 05/24/2016 0539   MONOABS 0.3 06/05/2014 0951   EOSABS 0.1 05/24/2016 0539   EOSABS 0.1 08/06/2015 1615   EOSABS 0.1 06/05/2014 0951   BASOSABS 0.0 05/24/2016 0539   BASOSABS 0.0 08/06/2015 1615   BASOSABS 0.0 06/05/2014 0951   CBG's (last 3):  No results for input(s): GLUCAP in the last 72 hours. LFT's Lab Results  Component Value Date   ALT 45 05/18/2016   AST 35 05/18/2016   ALKPHOS 64 05/18/2016   BILITOT 0.5 05/18/2016    Studies/Results: No results found.  Medications:  I have reviewed the patient's current medications. Scheduled Medications: . amantadine  100 mg Oral TID  . baclofen  20 mg Oral TID  . cephALEXin  500 mg Oral Q6H  . enoxaparin (LOVENOX) injection  40 mg Subcutaneous Q24H  .  fentaNYL  50 mcg Transdermal Q72H  . interferon beta-1a  44 mcg Subcutaneous Once per day on Mon Wed Fri  . levETIRAcetam  500 mg Oral BID  . multivitamin  1 tablet Oral BID  . oxybutynin  5 mg Oral QHS  . polyethylene glycol  17 g Oral BID  . senna-docusate  2 tablet Oral BID  . valACYclovir  1,000 mg Oral TID   PRN Medications: acetaminophen **OR** acetaminophen, ondansetron **OR** ondansetron (ZOFRAN) IV, oxyCODONE, sodium chloride flush, sorbitol  Assessment/Plan: Principal Problem:   Multiple sclerosis exacerbation (HCC) Active Problems:   Seizure disorder (HCC)   Slow transit constipation   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HCC)   Lymphedema   Hypoalbuminemia due to protein-calorie malnutrition (HCC)   Acute blood loss anemia   Urinary incontinence    Detrusor and sphincter dyssynergia   Neurogenic bowel   Herpes zoster without complication  1. MS exac with functional mobility paraparesis - debility requiring ongoing CIR for support - continue PT/OT as ongoing - meds for spasticity as ongoing 2. Chronic pain - reports unchanged in BLE posterior thighs related to positioning in bed - unchanged with repositioning or air mattress - encouraged to rotate as per RN instructions and continue meds as needed + as scheduled 3. Neurogenic bladder with recent UTI s/p Keflex course; very frustrated with continuous incontinence - note Lasix 20mg  qd begun 4/16 but I am unclear indication and pt not taking same at home. Will DC Lasix AND DC Flomax - continue ditropan as reports overactive bladder at home (on no rx), "but never anything at home as bad as what is going on here - its so bad I can't go home like this!" 4. Sz disorder - no breakthru events noted - continue Keppra 5. Chronic lymphedema - continue Unna boots as needed (as PTA) 6. HZV uncomplicated - ongoing antiviral thru weekend as planned   Length of stay, days: 12   Rodd Heft A. Asa Lente, MD 05/29/2016, 10:14 AM

## 2016-05-29 NOTE — Plan of Care (Signed)
Problem: RH Toileting Goal: LTG Patient will perform toileting w/assist, cues/equip (OT) LTG: Patient will perform toiletiing (clothes management/hygiene) with assist, with/without cues using equipment (OT)  Downgraded due to slow progress MH

## 2016-05-29 NOTE — Progress Notes (Signed)
C/O BLE spasms, rating pain with spasms a 10. PRN oxy ir given at 0100. +/- sleep because of incontinence and spasms. Incontinent of urine, last PVR=253. No I & O cath on this shift. Patrici Ranks A

## 2016-05-29 NOTE — Plan of Care (Signed)
Problem: RH BLADDER ELIMINATION Goal: RH STG MANAGE BLADDER WITH ASSISTANCE STG Manage Bladder With mod I Assistance  Outcome: Not Progressing Incontinent, intermittent caths

## 2016-05-30 ENCOUNTER — Inpatient Hospital Stay (HOSPITAL_COMMUNITY): Payer: Medicare Other

## 2016-05-30 ENCOUNTER — Inpatient Hospital Stay (HOSPITAL_COMMUNITY): Payer: Medicare Other | Admitting: Physical Therapy

## 2016-05-30 ENCOUNTER — Inpatient Hospital Stay (HOSPITAL_COMMUNITY): Payer: Medicare Other | Admitting: Occupational Therapy

## 2016-05-30 ENCOUNTER — Ambulatory Visit: Payer: Medicare Other | Admitting: Diagnostic Neuroimaging

## 2016-05-30 LAB — URINALYSIS, COMPLETE (UACMP) WITH MICROSCOPIC
Bilirubin Urine: NEGATIVE
Glucose, UA: 50 mg/dL — AB
Hgb urine dipstick: NEGATIVE
Ketones, ur: 5 mg/dL — AB
Nitrite: NEGATIVE
PROTEIN: 30 mg/dL — AB
Specific Gravity, Urine: 1.03 (ref 1.005–1.030)
pH: 5 (ref 5.0–8.0)

## 2016-05-30 MED ORDER — OXYBUTYNIN CHLORIDE 5 MG PO TABS
5.0000 mg | ORAL_TABLET | Freq: Four times a day (QID) | ORAL | Status: DC
  Administered 2016-05-30 (×3): 5 mg via ORAL
  Filled 2016-05-30 (×4): qty 1

## 2016-05-30 NOTE — Progress Notes (Signed)
Occupational Therapy Session Note  Patient Details  Name: Rebecca [name] MRN: [id number] Date of Birth: November 24, 1960  Today's Date: 05/30/2016 OT Individual Time: 0800-0900 OT Individual Time Calculation (min): 60 min   Short Term Goals: Week 2:  OT Short Term Goal 1 (Week 2): Continue working on established LTGs set at supervision to min assist level.    Skilled Therapeutic Interventions/Progress Updates: ADL-retraining seated at sink with focus on improved standing balance, improved activity tolerance, AE retraining.   Pt received supine in bed and requiring extra time to orient to POC for session.   Pt agreeable to B&D at sink as her normal routine in her home.  Per pt, she sleeps in her lift chair, bathes at the kitchen sink, and uses BSC near lift chair to toilet.  Pt and her husband rent their home.   Per pt tub is old ball and claw style and toilet is inaccessible to her.   Pt completed bed mobility with min setup to manage DME and extra time using bed rail and HOB elevated.  Pt performed SPT to w/c with standby assist and was placed at sink to expedite session.   Pt bathed upper body unassisted and lower body partially due to unaboots at lower legs.   Pt was able to use reacher to assist with dressing as instructed but required min assist to lace pants due to unaboot restriction to material.  OT washed her buttocks and provided steadying assist and overall mod assist to apply new brief and pull up pants.   Pt groomed with setup assist to gather supplies, extra time, and supervision to problem-solve.   Pt remained in w/c at end of session with all needs placed within reach.     Therapy Documentation Precautions:  Precautions Precautions: Fall Precaution Comments: R PF contracture Restrictions Weight Bearing Restrictions: No   Pain: Pain Assessment Pain Assessment: No/denies pain Pain Score: 0-No pain   See Function Navigator for Current Functional Status.   Therapy/Group: Individual  Therapy  Rebecca [name] 05/30/2016, 11:35 AM

## 2016-05-30 NOTE — Progress Notes (Signed)
Orthopedic Tech Progress Note Patient Details:  Rebecca [name] October 18, 1960 [id number]  Ortho Devices Type of Ortho Device: Louretta Parma boot Ortho Device/Splint Location: bilateral Ortho Device/Splint Interventions: Application   Jazmarie Biever 05/30/2016, 10:08 AM

## 2016-05-30 NOTE — Plan of Care (Signed)
Problem: RH BLADDER ELIMINATION Goal: RH STG MANAGE BLADDER WITH ASSISTANCE STG Manage Bladder With mod I Assistance  Outcome: Not Progressing Total assist, incontinent of urine

## 2016-05-30 NOTE — Discharge Summary (Signed)
NAME:  Rebecca [name], Rebecca [name]                     ACCOUNT NO.:  MEDICAL RECORD NO.:  [id number]  LOCATION:                                 FACILITY:  PHYSICIAN:  Delice Lesch, MD        DATE OF BIRTH:  [birth year]  DATE OF ADMISSION:  05/17/2016 DATE OF DISCHARGE:06/02/2016                              DISCHARGE SUMMARY   DISCHARGE DIAGNOSES: 1. Decreased functional mobility, paraparesis secondary to MS     exacerbation. 2. Subcutaneous Lovenox for deep vein thrombosis prophylaxis. 3. Pain management. 4. Neurogenic bladder/Enterobacter UTI. 5. Seizure disorder. 6. Constipation. 7. Spastic diplegia. 8. Chronic lymph edema. 9. Acute blood loss anemia. 10.Thrombocytopenia. 11.Neurogenic bowel. 12.Herpes zoster.  HISTORY OF PRESENT ILLNESS:  This is a 56 year old right-handed female, history of chronic pain, lymphedema bilateral lower extremities, right breast cancer, chemotherapy as well as seizure disorder.  Multiple sclerosis diagnosed in 2001, maintained on interferon.  She lives with spouse, one-level home.  The patient transfers and ambulates within the home using a rolling walker.  Presented May 14, 2016, with bilateral lower extremity weakness over the past 4-5 days.  Denied any recent fall.  Denied any recent flare ups of exacerbation related to her MS. MRI of the brain, cervical thoracic spine reviewed by Neurology consistent with MS.  No suggestion of active disease.  Placed on intravenous Solu-Medrol x3 days.  Suspect UTI, placed on intravenous Rocephin, changed to Keflex.  Subcutaneous Lovenox for DVT prophylaxis. Physical and occupational therapy ongoing.  The patient was admitted for a comprehensive rehab program.  PAST MEDICAL HISTORY:  See discharge diagnoses.  SOCIAL HISTORY:  Lives with spouse.  Used a rolling walker prior to admission.  Functional status upon admission to rehab services was moderate assist, ambulate 2 feet rolling walker; moderate assist, sit  to stand.  Max assist, activities of daily living.  PHYSICAL EXAMINATION:  VITAL SIGNS:  Blood pressure 118/64, pulse 73, temperature 98, and respirations 20. GENERAL:  This was an alert female, in no acute distress, oriented x3. EOMs intact. NECK:  Supple.  Nontender.  No JVD. CARDIAC:  Regular without murmur. ABDOMEN:  Soft, nontender.  Good bowel sounds.  She had a fair awareness of her deficits.  REHABILITATION HOSPITAL COURSE:  The patient was admitted to inpatient rehab services with therapies initiated on a 3-hour daily basis, consisting of physical therapy, occupational therapy, and rehabilitation nursing.  The following issues were addressed during the patient's rehabilitation stay.  Pertaining to Ms. Ardoin's MS exacerbation, she continued to progress with therapy.  She remained on her interferon as directed.  Subcutaneous Lovenox for DVT prophylaxis.  No bleeding episodes.  Pain management with the use of baclofen 20 mg t.i.d., Duragesic patch, and oxycodone as needed.  Neurogenic bladder, she completed a course of Keflex for suspect UTI as well as second UTI with Enterobacter completing course of Cipro.  Continued with timed voiding trials.  Maintained on Ditropan and adjusted as needed.  Maintained on Keppra for history of seizure disorder.  No seizure activity noted.  Spastic diplegia, baclofen was titrated.  Bilateral PRAFOs as directed.  Chronic lymphedema, continue compression wraps as advised.  Thrombocytopenia And continue to monitor.  Neurogenic bowel with bowel program regulated.  Full teaching completed.  Herpes zoster, maintained on Valtrex May 23, 2016, to May 30, 2016.  The patient received weekly collaborative interdisciplinary team conferences to discuss estimated length of stay, family teaching, any barriers to her discharge.  Working with energy conservation techniques.  Minimal assist rolling in the bed right to left.  She continued to be very  motivated.  Needed some assist for lower body dressing.  Ambulating 35 feet rolling walker supervision. Increased time to complete some tasks.  Initiated stair training up and down 3 inch steps, ascending forward, leading with left lower extremity, descending backwards, leading with right lower extremity.  Required minimal assist to clear the step with the right lower extremity. Ongoing family teaching with her husband for car transfers.  Toileting and toilet transfers, requiring steady minimal assistance.  Full family teaching was completed and plan discharge to home.  DISCHARGE MEDICATIONS:  Included: 1. Amantadine 100 mg p.o. t.i.d. 2. Baclofen 20 mg p.o. t.i.d. 3. Duragesic patch 50 mcg, change every 72 hours. 4. Interferon 44 mcg subcutaneously 3 times weekly. 5. Keppra 500 mg p.o. b.i.d. 6. Multivitamin daily. 7. Ditropan 5 mg p.o. 3 times a day and 10 mg at bedtime. 8. MiraLAX twice daily. Hold for loose stool 9.Senokot-S 2 tablets b.i.d., hold for loose stools. 10.Oxycodone 5 mg p.o. every 6 hours as needed, pain. 11. Cipro 250 mg by mouth twice a day until 06/07/2016 and stop   DIET:  Her diet was regular.  She would follow up with Dr. Delice Lesch, the Outpatient Rehab Service Office as directed; Dr. Enid Derry, Medical Management.  SPECIAL INSTRUCTIONS:  Continue compression wraps lower extremities for lymphedema.     Lauraine Rinne, P.A.   ______________________________ Delice Lesch, MD    DA/MEDQ  D:  05/30/2016  T:  05/30/2016  Job:  [id number]  cc:   Delice Lesch, MD [name], MD

## 2016-05-30 NOTE — Progress Notes (Signed)
Occupational Therapy Session Note  Patient Details  Name: Rebecca [name] MRN: [id number] Date of Birth: September 15, 1960  Today's Date: 05/30/2016 OT Individual Time: 1001-1100 OT Individual Time Calculation (min): 59 min    Short Term Goals: Week 2:  OT Short Term Goal 1 (Week 2): Continue working on established LTGs set at supervision to min assist level.    Skilled Therapeutic Interventions/Progress Updates:    Pt worked on supine to sit EOB secondary to [employer] being applied prior to session.  Supervision for transfer to the EOB with use of the rail for support.  Pt transferred to the wheelchair with min assist for support and then utilized the bariatric sock aide to donn her gripper socks with setup.  She then worked on wheelchair mobility up the hallway with supervision and therapist assist to take her all the way to the [place] room.  Had her complete simulated toilet transfer to the 3:1 over the toilet with min assist.  Once finished therapist transported her back to the room and concluded session with education on shoulder extension and horizontal abduction exercises using the light resistance orange theraband.  She completed 2 sets of 10 repetitions for each exercise with min instructional cueing for sequencing.  Pt left in bedside chair with call button and phone in reach.    Therapy Documentation Precautions:  Precautions Precautions: Fall Precaution Comments: R PF contracture Restrictions Weight Bearing Restrictions: No  Pain: Pain Assessment Pain Assessment: No/denies pain Pain Score: 0-No pain ADL: See Function Navigator for Current Functional Status.   Therapy/Group: Individual Therapy  Cari Vandeberg OTR/L 05/30/2016, 12:46 PM

## 2016-05-30 NOTE — Progress Notes (Signed)
Social Work Patient ID: Rebecca [name], female   DOB: [birth year], 56 y.o.   MRN: [id number]  Contacted husband with pt to find out any news. He reports no psychiatrist has seen him yet. Pt voiced what will she do if he is still there. Discussed in am will begin the search for NH. Will know more in the am. Asked husband to contact pt in her room to let her know if  Discharged or when MD comes in to see him. He reports he will. Pt felt better and more at ease. Will see in am and work on a plan.

## 2016-05-30 NOTE — Progress Notes (Signed)
Social Work Patient ID: Rebecca [name], female   DOB: 1960-03-20, 56 y.o.   MRN: [id number]  Met with pt to discuss husband and call him at [place]. Pt is glad to know where he is she had been calling him at home with No answer. He explained to her he fell and cut himself. She is aware he is awaiting psych evaluation. Discussed if both felt ready for her to go home tomorrow, both are concerned about her voiding frequently. MD is adding more medicines for this. Discussed the option to go to a NH for a short time then home, once both feel more ready. Pt would like to go home, but understands if husband is admitted then she will need to go to a NH. Will see what happens if husband is released or admitted. Will go back this afternoon and call husband with pt and see if any more news. Asked pt if she was safe at home with him drinking and coming to the ER as much. She reports: " He hides his drinking from me and I am safe at home." She feels he is a good man but gets stressed and needs a release. Will begin NH process in case husband is admitted today to [facility].

## 2016-05-30 NOTE — Progress Notes (Signed)
[place] PHYSICAL MEDICINE & REHABILITATION     PROGRESS NOTE  Subjective/Complaints:  Pt seen sitting up at the EOB this AM working with therapies.  She slept well overnight, but states she has urinary freq/incontinence and is weary about going home.   ROS: Denies CP, SOB, N/V/D.  Objective: Vital Signs: Blood pressure 121/65, pulse 72, temperature 97.6 F (36.4 C), temperature source Oral, resp. rate 18, height 5\' 4"  (1.626 m), weight 87.5 kg (193 lb), SpO2 99 %. No results found. No results for input(s): WBC, HGB, HCT, PLT in the last 72 hours. No results for input(s): NA, K, CL, GLUCOSE, BUN, CREATININE, CALCIUM in the last 72 hours.  Invalid input(s): CO CBG (last 3)  No results for input(s): GLUCAP in the last 72 hours.  Wt Readings from Last 3 Encounters:  05/20/16 87.5 kg (193 lb)  05/14/16 79.4 kg (175 lb)  11/25/15 77.1 kg (170 lb)    Physical Exam:  BP 121/65 (BP Location: Left Arm)   Pulse 72   Temp 97.6 F (36.4 C) (Oral)   Resp 18   Ht 5\' 4"  (1.626 m)   Wt 87.5 kg (193 lb)   SpO2 99%   BMI 33.13 kg/m  Gen: Well developed. NAD. Vital signs reviewed. Head: Normocephalic. Atraumatic. Eyes: EOMI. No discharge Cardiovascular: RRR. No JVD. Left chest wall port.  Respiratory: CTA B. Unlabored. GI: Soft. Bowel sounds are normal.  Musc: Edema b/l LE. Right heel cord contracture Neurological. Alert and oriented  Fair awareness of deficits.  Motor: RUE: 4/5 proximal to distal  LUE: 4/5 proximal to distal RLE: 4-/5 HF, 4-/5 KE, 4/5 ADF/PF LLE: 3+/5 HF, 4-/5 KE, 4/5 ADF/PF Skin. Chronic lymphedema lower extremities with dressing over both legs.  Psych: pleasant and cooperative  Assessment/Plan: 1. Functional deficits secondary to MS exacerbation which require 3+ hours per day of interdisciplinary therapy in a comprehensive inpatient rehab setting. Physiatrist is providing close team supervision and 24 hour management of active medical problems listed  below. Physiatrist and rehab team continue to assess barriers to discharge/monitor patient progress toward functional and medical goals.  Function:  Bathing Bathing position   Position: Wheelchair/chair at sink  Bathing parts Body parts bathed by patient: Right arm, Left arm, Chest, Abdomen, Right upper leg, Left upper leg, Front perineal area Body parts bathed by helper: Buttocks, Back  Bathing assist        Upper Body Dressing/Undressing Upper body dressing   What is the patient wearing?: Hospital gown     Pull over shirt/dress - Perfomed by patient: Pull shirt over trunk          Upper body assist Assist Level: Supervision or verbal cues      Lower Body Dressing/Undressing Lower body dressing   What is the patient wearing?: Non-skid slipper socks     Pants- Performed by patient: Thread/unthread right pants leg, Thread/unthread left pants leg Pants- Performed by helper: Pull pants up/down Non-skid slipper socks- Performed by patient: Don/doff right sock, Don/doff left sock Non-skid slipper socks- Performed by helper: Don/doff right sock, Don/doff left sock       Shoes - Performed by helper: Don/doff right shoe, Don/doff left shoe, Fasten right, Fasten left          Lower body assist Assist for lower body dressing: Touching or steadying assistance (Pt > 75%)      Toileting Toileting Toileting activity did not occur: No continent bowel/bladder event Toileting steps completed by patient: Adjust clothing prior to  toileting, Adjust clothing after toileting, Performs perineal hygiene Toileting steps completed by helper: Adjust clothing prior to toileting, Performs perineal hygiene, Adjust clothing after toileting Toileting Assistive Devices: Grab bar or rail  Toileting assist Assist level: Two helpers (stedy)   Transfers Chair/bed transfer Chair/bed transfer activity did not occur: Safety/medical concerns Chair/bed transfer method: Ambulatory Chair/bed transfer  assist level: Moderate assist (Pt 50 - 74%/lift or lower) Chair/bed transfer assistive device: Armrests, Walker Mechanical lift: Ecologist     Max distance: 35 ft Assist level: Supervision or verbal cues   Wheelchair   Type: Manual Max wheelchair distance: 150 ft Assist Level: Supervision or verbal cues  Cognition Comprehension Comprehension assist level: Follows basic conversation/direction with no assist  Expression Expression assist level: Expresses basic 90% of the time/requires cueing < 10% of the time.  Social Interaction Social Interaction assist level: Interacts appropriately 90% of the time - Needs monitoring or encouragement for participation or interaction.  Problem Solving Problem solving assist level: Solves basic 75 - 89% of the time/requires cueing 10 - 24% of the time  Memory Memory assist level: Recognizes or recalls 75 - 89% of the time/requires cueing 10 - 24% of the time    Medical Problem List and Plan: 1. 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 Continue interferon as directed  Cont CIR therapies  Plan was for d/c tomorrow, However patient stating she cannot go home with the increase in urinary frequency. Discussed with patient that at baseline she was urinating every one hours daily at bedtime. She states that she was not on any medication, but was on Detrol 20 mg. Further, received communication from overnight nurse stating that patient's husband has been admitted for alcohol abuse with no protocol. Spoke with PA, discussed with patient and nursing at ED who states patient is routinely in ED due to alcohol. Unclear of disposition at this point. We will need present options to patient and device plan for discharge. 2. DVT Prophylaxis/Anticoagulation: Subcutaneous Lovenox. Monitor for any bleeding episodes 3. Pain Management/chronic pain:Baclofen 20 mg 3 times a day, Duragesic patch 50 g, oxycodone as  needed 4. Mood: Provide emotional support 5. Neuropsych: This patient iscapable of making decisions on herown behalf. 6. Skin/Wound Care: Routine skin care bilateral lower extremities, stage 1 sacral 7. Fluids/Electrolytes/Nutrition: Routine I&Os  BMP within acceptable rage 4/17 8.Neurogenic bladder/DSD. Completecourse of Keflex.   Pt reports very overactive bladder at home  ContinueTimed voiding, oob to void  Flomax started 4/16 d/ced 4/22  Lasix 20mg  qhs started 4/16, changed to daily, d/ced 4/22  Ditropan 5mg  qhs started 4/17, increased to QID on 4/23 (home dose 20)  UA/Ucx ordered  Will need further monitoring/adjustments as outpt 9.Seizure disorder. Keppra 500 mg twice a day 10.Constipation. 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 (tension PRAFO RLE---needs to be taut---instructed pt/RN)  aggressive ROM with therapy  Will consider Botox as outpt 12. Chronic lymphedema--continue compression wraps. Pt has Unna boots at home  [place] consulted 13. Mild hypoalbuminemia  Cont to monitor 14. ABLA  Hb 12.3 on 4/17  Cont to monitor 15. Thrombocytopenia  Plts 111 on 4/17  Cont to monitor 16. Constipation/Neurogenic bowel  Increased bowel reg 4/13, again 4/16, again 4/19 17. Herpes Zoster  Valcyclovir completed 4/16-4/23  >35 minutes spent with patient, family, and coordination of care regarding bladder symptoms, functional status, and status of husband.  LOS (Days) 13 A FACE TO FACE EVALUATION WAS PERFORMED  Khanh Tanori Lorie Phenix 05/30/2016 10:34 AM

## 2016-05-30 NOTE — Discharge Instructions (Signed)
Inpatient Rehab Discharge Instructions  HOLLIE WOJAHN Discharge date and time: No discharge date for patient encounter.   Activities/Precautions/ Functional Status: Activity: activity as tolerated Diet: regular diet Wound Care: none needed Functional status:  ___ No restrictions     ___ Walk up steps independently ___ 24/7 supervision/assistance   ___ Walk up steps with assistance ___ Intermittent supervision/assistance  ___ Bathe/dress independently ___ Walk with walker     _x__ Bathe/dress with assistance ___ Walk Independently    ___ Shower independently ___ Walk with assistance    ___ Shower with assistance ___ No alcohol     ___ Return to work/school ________  Special Instructions: Continue compression wraps lower extremities for chronic lymphedema   My questions have been answered and I understand these instructions. I will adhere to these goals and the provided educational materials after my discharge from the hospital.  Patient/Caregiver Signature _______________________________ Date __________  Clinician Signature _______________________________________ Date __________  Please bring this form and your medication list with you to all your follow-up doctor's appointments.

## 2016-05-30 NOTE — Progress Notes (Signed)
Physical Therapy Session Note  Patient Details  Name: Rebecca [name] MRN: [id number] Date of Birth: Jan 17, 1961  Today's Date: 05/30/2016 PT Individual Time: 1300-1415 PT Individual Time Calculation (min): 75 min   Short Term Goals: Week 2:  PT Short Term Goal 1 (Week 2): = LTGs due to anticipated LOS  Skilled Therapeutic Interventions/Progress Updates:    Pt in bathroom with nursing upon arrival, agreeable to PT assistance. Pt standing with stedy for hygiene and max assist for donning briefs/pants. Pt then transported to sink to wash hands and then to w/c. Pt propelling w/c 150 ft with cues for long pushes. Ambulation performed in hall with rw, 34 ft, 37 ft and supervision. Seated rest needed between ambulation attempts.  Transfers performed with min assist for forward weight shift. Repeating reps for sit<>stand with focus on forward lean and foot placement. Seated knee flexion stretch on Rt prior to sit<>stand for improved LE placement.  Static standing balance with decreasing UE support. Tactile cues for posture.   Therapy Documentation Precautions:  Precautions Precautions: Fall Precaution Comments: R PF contracture Restrictions Weight Bearing Restrictions: No Pain: Denies pain  See Function Navigator for Current Functional Status.   Therapy/Group: Individual Therapy  Linard Millers, PT 05/30/2016, 3:09 PM

## 2016-05-30 NOTE — NC FL2 (Signed)
[place] LEVEL OF CARE SCREENING TOOL     IDENTIFICATION  Patient Name: Rebecca [name] Birthdate: [birth year] Sex: female Admission Date (Current Location): 05/17/2016  [place] and [place] Number:  Rebecca [name] [id number] [place] and Address:  [facility] [place], [place] [address], [place], [place] [address]      Provider Number: [id number]  Attending Physician Name and Address:  Ankit Lorie Phenix, MD  Relative Name and Phone Number:  [name] [phone]    Current Level of Care: Other (Comment) (rehab) Recommended Level of Care: [place] Prior Approval Number:    Date Approved/Denied:   PASRR Number:    Discharge Plan: SNF    Current Diagnoses: Patient Active Problem List   Diagnosis Date Noted  . Neurogenic bowel   . Herpes zoster without complication   . Detrusor and sphincter dyssynergia   . Urinary incontinence   . Seizure disorder ([place])   . Slow transit constipation   . 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[place])   . Lymphedema   . Hypoalbuminemia due to protein-calorie malnutrition ([place])   . Acute blood loss anemia   . Multiple sclerosis exacerbation ([place]) 05/17/2016  . Encounter for screening for cervical cancer  11/27/2015  . Preventative health care 11/25/2015  . History of breast cancer in female 10/28/2015  . Anemia 10/28/2015  . Thrombocytopenia ([place]) 10/28/2015  . Allergic rhinitis 10/28/2015  . IBS (irritable bowel syndrome) 10/28/2015  . Encounter for eye exam 08/25/2015  . Aortic valve disease   . 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 History of right mastectomy 10/24/2014  . Need for immunization against influenza 10/24/2014  . Medicare annual wellness visit, subsequent 10/24/2014  . MS (multiple sclerosis) ([place]) 09/16/2014  . Hematoma complicating a procedure 03/06/2014  . Primary cancer of right female breast ([place]) 01/10/2014  . SOB (shortness of breath) 03/01/2013  . Bilateral lower extremity edema   . 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 Herpes zoster 06/08/2011    Orientation RESPIRATION BLADDER Height & Weight     Self, Time, Situation, Place  Normal Incontinent Weight: 193 lb (87.5 kg) Height:  5\' 4"  (162.6 cm)  BEHAVIORAL SYMPTOMS/MOOD NEUROLOGICAL BOWEL NUTRITION STATUS      Continent Diet (Heart healthy)  AMBULATORY STATUS COMMUNICATION OF NEEDS Skin   Limited Assist Verbally Normal                       Personal Care Assistance Level of Assistance  Bathing, Dressing Bathing Assistance: Limited assistance   Dressing Assistance: Limited assistance     Functional Limitations Info  Sight Sight Info: Impaired        SPECIAL CARE FACTORS FREQUENCY  PT (By licensed PT), OT (By licensed OT), Bowel and bladder program     PT Frequency: 5x week OT Frequency: 5 x week Bowel and Bladder Program Frequency: Bladder program-medication, depends, etc          Contractures Contractures Info: Not present    Additional Factors Info                  Current Medications (05/30/2016):  This is the current hospital active medication list Current Facility-Administered Medications  Medication Dose Route Frequency Provider Last Rate Last Dose  . acetaminophen (TYLENOL) tablet 650 mg  650 mg Oral Q6H PRN Lavon Paganini Angiulli, PA-C   650 mg at 05/30/16 0019   Or  . acetaminophen (TYLENOL) suppository 650 mg  650 mg Rectal Q6H PRN  Lavon Paganini Angiulli, PA-C      . amantadine (SYMMETREL) capsule 100 mg  100 mg Oral TID Lavon Paganini Angiulli, PA-C   100 mg at 05/30/16 0827  . baclofen (LIORESAL) tablet 20 mg  20 mg Oral TID Meredith Staggers, MD   20 mg at 05/30/16 0827  . cephALEXin (KEFLEX) capsule 500 mg  500 mg Oral Q6H Daniel J Angiulli, PA-C   500 mg at 05/30/16 8588  . enoxaparin (LOVENOX) injection 40 mg  40 mg Subcutaneous Q24H Lavon Paganini Angiulli, PA-C   40 mg at 05/29/16 2001  . fentaNYL (DURAGESIC - dosed mcg/hr) patch 50 mcg  50 mcg Transdermal Q72H Daniel J Angiulli, PA-C    50 mcg at 05/30/16 0020  . interferon beta-1a (REBIF) injection 44 mcg  44 mcg Subcutaneous Once per day on Mon Wed Fri Jamse Arn, MD   44 mcg at 05/27/16 2200  . levETIRAcetam (KEPPRA) tablet 500 mg  500 mg Oral BID Lavon Paganini Angiulli, PA-C   500 mg at 05/30/16 5027  . multivitamin (PROSIGHT) tablet 1 tablet  1 tablet Oral BID Ankit Lorie Phenix, MD   1 tablet at 05/30/16 0020  . ondansetron (ZOFRAN) tablet 4 mg  4 mg Oral Q6H PRN Lavon Paganini Angiulli, PA-C       Or  . ondansetron (ZOFRAN) injection 4 mg  4 mg Intravenous Q6H PRN Daniel J Angiulli, PA-C      . oxybutynin (DITROPAN) tablet 5 mg  5 mg Oral QID Ankit Lorie Phenix, MD      . oxyCODONE (Oxy IR/ROXICODONE) immediate release tablet 5 mg  5 mg Oral Q6H PRN Lavon Paganini Angiulli, PA-C   5 mg at 05/30/16 0019  . polyethylene glycol (MIRALAX / GLYCOLAX) packet 17 g  17 g Oral BID Ankit Lorie Phenix, MD   17 g at 05/30/16 7412  . senna-docusate (Senokot-S) tablet 2 tablet  2 tablet Oral BID Ankit Lorie Phenix, MD   2 tablet at 05/30/16 0827  . sodium chloride flush (NS) 0.9 % injection 10-40 mL  10-40 mL Intracatheter PRN Ankit Lorie Phenix, MD   10 mL at 05/29/16 1211  . sorbitol 70 % solution 30 mL  30 mL Oral Daily PRN Lavon Paganini Angiulli, PA-C   30 mL at 05/26/16 8786   Facility-Administered Medications Ordered in Other Encounters  Medication Dose Route Frequency Provider Last Rate Last Dose  . sodium chloride flush (NS) 0.9 % injection 10 mL  10 mL Intravenous PRN Lloyd Huger, MD   10 mL at 12/16/15 1249  . sodium chloride flush (NS) 0.9 % injection 10 mL  10 mL Intravenous PRN Lloyd Huger, MD   10 mL at 02/02/16 1421     Discharge Medications: Please see discharge summary for a list of discharge medications.  Relevant Imaging Results:  Relevant Lab Results:   Additional Information    Judi Jaffe, Gardiner Rhyme, LCSW

## 2016-05-31 ENCOUNTER — Ambulatory Visit: Payer: Medicare Other

## 2016-05-31 ENCOUNTER — Inpatient Hospital Stay (HOSPITAL_COMMUNITY): Payer: Medicare Other

## 2016-05-31 ENCOUNTER — Inpatient Hospital Stay (HOSPITAL_COMMUNITY): Payer: Medicare Other | Admitting: Physical Therapy

## 2016-05-31 DIAGNOSIS — N319 Neuromuscular dysfunction of bladder, unspecified: Secondary | ICD-10-CM

## 2016-05-31 DIAGNOSIS — N390 Urinary tract infection, site not specified: Secondary | ICD-10-CM

## 2016-05-31 MED ORDER — OXYBUTYNIN CHLORIDE 5 MG PO TABS
5.0000 mg | ORAL_TABLET | Freq: Three times a day (TID) | ORAL | Status: DC
  Administered 2016-05-31 – 2016-06-02 (×6): 5 mg via ORAL
  Filled 2016-05-31 (×6): qty 1

## 2016-05-31 MED ORDER — NITROFURANTOIN MONOHYD MACRO 100 MG PO CAPS
100.0000 mg | ORAL_CAPSULE | Freq: Two times a day (BID) | ORAL | Status: DC
  Administered 2016-05-31 – 2016-06-01 (×3): 100 mg via ORAL
  Filled 2016-05-31 (×3): qty 1

## 2016-05-31 MED ORDER — OXYBUTYNIN CHLORIDE 5 MG PO TABS
10.0000 mg | ORAL_TABLET | Freq: Every day | ORAL | Status: DC
  Administered 2016-05-31 – 2016-06-01 (×2): 10 mg via ORAL
  Filled 2016-05-31 (×2): qty 2

## 2016-05-31 NOTE — Progress Notes (Signed)
Social Work Patient ID: Rebecca [name], female   DOB: 02/25/1960, 56 y.o.   MRN: [id number]  Met with pt who has spoken with husband. Who is back in [facility] he was released late yesterday and has need up back in there last night. Discussed the probably he may be involuntary committed this time and is waiting once again to see the psychiatrist. Discussed with the latest news he is not at a place he can provide the care he requires, he can not provide his own care. Will need to begin pursuing NHP for a short time until he can get himself together and be able to be there for pt. Pt became tearful but is in agreement with this option. She states: " I don't know why he is doing this." Discussed it is something That is going on with him and has nothing to do with her. Will begin NH search.

## 2016-05-31 NOTE — Progress Notes (Signed)
Physical Therapy Session Note  Patient Details  Name: Rebecca [name] MRN: [id number] Date of Birth: [birth year]  Today's Date: 05/31/2016 PT Individual Time: 1100-1156 and 1430-1540 PT Individual Time Calculation (min): 56 min and 70 min  Short Term Goals: Week 2:  PT Short Term Goal 1 (Week 2): = LTGs due to anticipated LOS  Skilled Therapeutic Interventions/Progress Updates:   Treatment 1: Patient in wheelchair reporting urgency. Utilized St[place] to transfer sit <> stand with supervision and verbal cues for sequencing to transfer from wheelchair <> elevated BSC over toilet with incontinence but able to continue voiding on commode. Patient stood with BUE or single UE support in [place] with supervision up to 5 minutes to assist with hygiene and total A for clothing management. Patient washed hands from [place]. Patient propelled wheelchair using BUE x 150 ft with greatly improved efficiency and technique with supervision. Sit <> stand transfers from wheelchair with min faded to mod A lifting assistance to facilitate anterior weight shift. Gait training using RW x 65 ft with supervision. 10 MWT using RW = 0.037 m/s. Performed stand pivot transfer to and from NuStep. Performed NuStep using BUE/BLE at level 5 > 6 x 10 min for BLE NMR and reciprocal pattern retraining. Throughout session patient demonstrating improved ability to position BLE in preparation for transfers by using BUE to move [place] back. Patient left sitting in wheelchair with all needs within reach.    Treatment 2: Patient in wheelchair with aunt present to observe. Patient propelled wheelchair using BUE with supervision. Sit <> stand from wheelchair with min A for anterior weight shift. Gait training using RW x 50 ft + 20 ft with supervision overall, manual facilitation for R knee flexion during initial swing phase and verbal cues for increased step length to facilitate reciprocal gait pattern with verbal cues for forward gaze. Stair training  up/down 8 (3") stairs using 2 rails with heavy dependence on BUE support with min A ascending/descending for manual facilitation of R knee flexion/RLE placement, verbal cues for sequencing and advancing BUE along rails, and greatly increased time. Sit > supine on mat table with min A for lifting RLE and supine > sit with max verbal cues for sequencing and technique and min A to bring LLE off table . Performed BLE PROM x 60 sec each: knee to chest, hamstrings, hip ER, and hip adductor stretch, gentle lumbar rotation to R and L and supine NMR: unable to perform bridging in hooklying therefore attempted bridging over bolster with musle activation noted but no buttock clearance achieved, SAQ over bolster x 15 each LE, SLR x 15 each LE with assist for RLE. Rolling to R and L with assist for opposite knee flexion and max multimodal cues for sequencing and technique with mod A overall. Stand pivot transfer from mat table to wheelchair using RW with mod A lifting assist for anterior weight shift. Patient propelled back toward room and left sitting in wheelchair with all needs within reach and aunt present.   Therapy Documentation Precautions:  Precautions Precautions: Fall Precaution Comments: R PF contracture Restrictions Weight Bearing Restrictions: No Pain: Pain Assessment Pain Assessment: No/denies pain  See Function Navigator for Current Functional Status.   Therapy/Group: Individual Therapy  Kiaan Overholser, Murray Hodgkins 05/31/2016, 12:11 PM

## 2016-05-31 NOTE — Progress Notes (Signed)
[place] PHYSICAL MEDICINE & REHABILITATION     PROGRESS NOTE  Subjective/Complaints:  Pt seen laying in bed this AM.  She states she had urinary freq overnight, but her daytime has improved.   ROS: Denies CP, SOB, N/V/D.  Objective: Vital Signs: Blood pressure 102/61, pulse 89, temperature 98.2 F (36.8 C), temperature source Oral, resp. rate 18, height 5\' 4"  (1.626 m), weight 87.5 kg (193 lb), SpO2 100 %. No results found. No results for input(s): WBC, HGB, HCT, PLT in the last 72 hours. No results for input(s): NA, K, CL, GLUCOSE, BUN, CREATININE, CALCIUM in the last 72 hours.  Invalid input(s): CO CBG (last 3)  No results for input(s): GLUCAP in the last 72 hours.  Wt Readings from Last 3 Encounters:  05/20/16 87.5 kg (193 lb)  05/14/16 79.4 kg (175 lb)  11/25/15 77.1 kg (170 lb)    Physical Exam:  BP 102/61 (BP Location: Left Arm)   Pulse 89   Temp 98.2 F (36.8 C) (Oral)   Resp 18   Ht 5\' 4"  (1.626 m)   Wt 87.5 kg (193 lb)   SpO2 100%   BMI 33.13 kg/m  Gen: Well developed. NAD. Vital signs reviewed. Head: Normocephalic. Atraumatic. Eyes: EOMI. No discharge Cardiovascular: RRR. No JVD. Left chest wall port.  Respiratory: CTA B. Unlabored. GI: Soft. Bowel sounds are normal.  Musc: Edema b/l LE. Right heel cord contracture Neurological. Alert and oriented  Fair awareness of deficits.  Motor: RUE: 4/5 proximal to distal  LUE: 4/5 proximal to distal RLE: 4-/5 HF, 4-/5 KE, 4/5 ADF/PF (stable) LLE: 3+/5 HF, 4-/5 KE, 4/5 ADF/PF Skin. Chronic lymphedema lower extremities with dressing over both legs.  Psych: pleasant and cooperative  Assessment/Plan: 1. Functional deficits secondary to MS exacerbation which require 3+ hours per day of interdisciplinary therapy in a comprehensive inpatient rehab setting. Physiatrist is providing close team supervision and 24 hour management of active medical problems listed below. Physiatrist and rehab team continue to assess  barriers to discharge/monitor patient progress toward functional and medical goals.  Function:  Bathing Bathing position   Position: Wheelchair/chair at sink  Bathing parts Body parts bathed by patient: Right arm, Left arm, Chest, Abdomen, Front perineal area, Right upper leg, Left upper leg Body parts bathed by helper: Back, Buttocks  Bathing assist Assist Level: Touching or steadying assistance(Pt > 75%)      Upper Body Dressing/Undressing Upper body dressing   What is the patient wearing?: Pull over shirt/dress     Pull over shirt/dress - Perfomed by patient: Thread/unthread right sleeve, Thread/unthread left sleeve, Put head through opening, Pull shirt over trunk          Upper body assist Assist Level: Set up, More than reasonable time      Lower Body Dressing/Undressing Lower body dressing   What is the patient wearing?: Non-skid slipper socks   Underwear - Performed by helper: Thread/unthread right underwear leg, Thread/unthread left underwear leg, Pull underwear up/down (To don briefs d/t incontinence) Pants- Performed by patient: Thread/unthread right pants leg, Thread/unthread left pants leg, Pull pants up/down (using reacher) Pants- Performed by helper: Pull pants up/down Non-skid slipper socks- Performed by patient: Don/doff right sock, Don/doff left sock Non-skid slipper socks- Performed by helper: Don/doff right sock, Don/doff left sock (due to unaboots applied)       Shoes - Performed by helper: Don/doff right shoe, Don/doff left shoe, Fasten right, Fasten left          Lower  body assist Assist for lower body dressing:  (Mod assist)      Toileting Toileting Toileting activity did not occur: Refused Toileting steps completed by patient: Adjust clothing prior to toileting, Adjust clothing after toileting, Performs perineal hygiene Toileting steps completed by helper: Adjust clothing prior to toileting, Performs perineal hygiene, Adjust clothing after  toileting Toileting Assistive Devices: Grab bar or rail  Toileting assist Assist level: Touching or steadying assistance (Pt.75%)   Transfers Chair/bed transfer Chair/bed transfer activity did not occur: Safety/medical concerns Chair/bed transfer method: Ambulatory Chair/bed transfer assist level: Touching or steadying assistance (Pt > 75%) Chair/bed transfer assistive device: Walker, Armrests Mechanical lift: Ecologist     Max distance: 37 ft Assist level: Supervision or verbal cues   Wheelchair   Type: Manual Max wheelchair distance: 150 ft Assist Level: Supervision or verbal cues  Cognition Comprehension Comprehension assist level: Understands complex 90% of the time/cues 10% of the time  Expression Expression assist level: Expresses basic needs/ideas: With no assist  Social Interaction Social Interaction assist level: Interacts appropriately 90% of the time - Needs monitoring or encouragement for participation or interaction.  Problem Solving Problem solving assist level: Solves basic 75 - 89% of the time/requires cueing 10 - 24% of the time  Memory Memory assist level: Recognizes or recalls 75 - 89% of the time/requires cueing 10 - 24% of the time    Medical Problem List and Plan: 1. 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 Continue interferon as directed  Cont CIR therapies  Will likely need SNF placement as pt's husband is still in hospital with ?disposition.  Will follow up 2. DVT Prophylaxis/Anticoagulation: Subcutaneous Lovenox. Monitor for any bleeding episodes 3. Pain Management/chronic pain:Baclofen 20 mg 3 times a day, Duragesic patch 50 g, oxycodone as needed 4. Mood: Provide emotional support 5. Neuropsych: This patient iscapable of making decisions on herown behalf. 6. Skin/Wound Care: Routine skin care bilateral lower extremities, stage 1 sacral 7. Fluids/Electrolytes/Nutrition: Routine I&Os  BMP  within acceptable rage 4/17 8.Neurogenic bladder/DSD. Completecourse of Keflex.   Pt reports very overactive bladder at home  ContinueTimed voiding, oob to void  Flomax started 4/16 d/ced 4/22  Lasix 20mg  qhs started 4/16, changed to daily, d/ced 4/22  Ditropan 5mg  TID and 10mg  qhs started 4/24 (home dose 20)  UA+, awaiting Ucx   Empiric macrobid started 4/24  Will need further monitoring/adjustments as outpt 9.Seizure disorder. Keppra 500 mg twice a day 10.Constipation. 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 (tension PRAFO RLE---needs to be taut---instructed pt/RN)  aggressive ROM with therapy  Will consider Botox as outpt 12. Chronic lymphedema--continue compression wraps. Pt has Unna boots at home  [place] consulted 13. Mild hypoalbuminemia  Cont to monitor 14. ABLA  Hb 12.3 on 4/17  Cont to monitor 15. Thrombocytopenia  Plts 111 on 4/17  Cont to monitor 16. Constipation/Neurogenic bowel  Increased bowel reg 4/13, again 4/16, again 4/19 17. Herpes Zoster  Valcyclovir completed 4/16-4/23  LOS (Days) 14 A FACE TO FACE EVALUATION WAS PERFORMED  Randee Upchurch Lorie Phenix 05/31/2016 9:34 AM

## 2016-05-31 NOTE — Progress Notes (Signed)
Occupational Therapy Session Note  Patient Details  Name: Rebecca [name] MRN: [id number] Date of Birth: 04-Sep-1960  Today's Date: 05/31/2016 OT Individual Time: 6063-0160 OT Individual Time Calculation (min): 57 min    Short Term Goals: Week 2:  OT Short Term Goal 1 (Week 2): Continue working on established LTGs set at supervision to min assist level.    Skilled Therapeutic Interventions/Progress Updates:    1:1 bathe and dress focus. Pt received seated on toilet after having bowel movement. Pt stands in steady with supervision as OT performs posterior hygiene. Pt transfers back into w/c in stedy with TOTAL A. Pt bathes in sitting and standing with MOD A for lifting with VC for posture/weight shifting to wash peri area. Pt dons pull over shirt with supervision and pants with MIN A to thread RLE and Vc for reacher use. Pt stands to advance pants over hips as stated above for bathing. Pt brushes teeth and hair in sitting with supervision. Pt tearful and expresses concerns of DC since spouse in hospital. OT provides support and encouragement. Exited session with pt seated in w/c with call light in reach and all needs met.   Therapy Documentation Precautions:  Precautions Precautions: Fall Precaution Comments: R PF contracture Restrictions Weight Bearing Restrictions: No General:   Vital Signs:   Pain: Pain Assessment Pain Assessment: No/denies pain  See Function Navigator for Current Functional Status.   Therapy/Group: Individual Therapy  Tonny Branch 05/31/2016, 12:45 PM

## 2016-06-01 ENCOUNTER — Inpatient Hospital Stay (HOSPITAL_COMMUNITY): Payer: Medicare Other | Admitting: Occupational Therapy

## 2016-06-01 ENCOUNTER — Encounter (HOSPITAL_COMMUNITY): Payer: Medicare Other | Admitting: Psychology

## 2016-06-01 ENCOUNTER — Inpatient Hospital Stay (HOSPITAL_COMMUNITY): Payer: Medicare Other | Admitting: Physical Therapy

## 2016-06-01 DIAGNOSIS — F4323 Adjustment disorder with mixed anxiety and depressed mood: Secondary | ICD-10-CM

## 2016-06-01 LAB — URINE CULTURE: Culture: >=100000 — AB

## 2016-06-01 LAB — GLUCOSE, CAPILLARY: Glucose-Capillary: 103 mg/dL — ABNORMAL HIGH (ref 65–99)

## 2016-06-01 MED ORDER — FENTANYL 50 MCG/HR TD PT72: 50.0000 ug | MEDICATED_PATCH | TRANSDERMAL | 0 refills | Status: DC

## 2016-06-01 MED ORDER — AMANTADINE HCL 100 MG PO CAPS: 100.0000 mg | ORAL_CAPSULE | Freq: Three times a day (TID) | ORAL | 12 refills | Status: DC

## 2016-06-01 MED ORDER — OXYCODONE HCL 5 MG PO TABS: 5.0000 mg | ORAL_TABLET | Freq: Four times a day (QID) | ORAL | 0 refills | Status: DC | PRN

## 2016-06-01 MED ORDER — BACLOFEN 20 MG PO TABS: 20.0000 mg | ORAL_TABLET | Freq: Three times a day (TID) | ORAL | 0 refills | Status: DC

## 2016-06-01 MED ORDER — CIPROFLOXACIN HCL 250 MG PO TABS
250.0000 mg | ORAL_TABLET | Freq: Two times a day (BID) | ORAL | Status: DC
  Administered 2016-06-01 – 2016-06-02 (×3): 250 mg via ORAL
  Filled 2016-06-01 (×3): qty 1

## 2016-06-01 NOTE — Progress Notes (Signed)
[place] PHYSICAL MEDICINE & REHABILITATION     PROGRESS NOTE  Subjective/Complaints:  Pt seen laying in bed this AM.  She had urinary frequency overnight, but symptoms improved during the day.   ROS: Denies CP, SOB, N/V/D.  Objective: Vital Signs: Blood pressure 106/66, pulse 77, temperature 98.3 F (36.8 C), temperature source Oral, resp. rate 17, height 5\' 4"  (1.626 m), weight 87.5 kg (193 lb), SpO2 97 %. No results found. No results for input(s): WBC, HGB, HCT, PLT in the last 72 hours. No results for input(s): NA, K, CL, GLUCOSE, BUN, CREATININE, CALCIUM in the last 72 hours.  Invalid input(s): CO CBG (last 3)  No results for input(s): GLUCAP in the last 72 hours.  Wt Readings from Last 3 Encounters:  05/20/16 87.5 kg (193 lb)  05/14/16 79.4 kg (175 lb)  11/25/15 77.1 kg (170 lb)    Physical Exam:  BP 106/66 (BP Location: Left Arm)   Pulse 77   Temp 98.3 F (36.8 C) (Oral)   Resp 17   Ht 5\' 4"  (1.626 m)   Wt 87.5 kg (193 lb)   SpO2 97%   BMI 33.13 kg/m  Gen: Well developed. NAD. Vital signs reviewed. Head: Normocephalic. Atraumatic. Eyes: EOMI. No discharge Cardiovascular: RRR. No JVD. Left chest wall port.  Respiratory: CTA B. Unlabored. GI: Soft. Bowel sounds are normal.  Musc: Edema b/l LE. Right heel cord contracture Neurological. Alert and oriented  Fair awareness of deficits.  Motor: RUE: 4/5 proximal to distal  LUE: 4/5 proximal to distal RLE: 4-/5 HF, 4-/5 KE, 4/5 ADF/PF (unchanged) LLE: 3+/5 HF, 4-/5 KE, 4/5 ADF/PF Skin. Chronic lymphedema lower extremities with dressing over both legs.  Psych: pleasant and cooperative  Assessment/Plan: 1. Functional deficits secondary to MS exacerbation which require 3+ hours per day of interdisciplinary therapy in a comprehensive inpatient rehab setting. Physiatrist is providing close team supervision and 24 hour management of active medical problems listed below. Physiatrist and rehab team continue to assess  barriers to discharge/monitor patient progress toward functional and medical goals.  Function:  Bathing Bathing position   Position: Wheelchair/chair at sink  Bathing parts Body parts bathed by patient: Right arm, Left arm, Chest, Abdomen, Front perineal area, Right upper leg, Left upper leg Body parts bathed by helper: Back, Buttocks  Bathing assist Assist Level: Touching or steadying assistance(Pt > 75%)      Upper Body Dressing/Undressing Upper body dressing   What is the patient wearing?: Pull over shirt/dress     Pull over shirt/dress - Perfomed by patient: Thread/unthread right sleeve, Thread/unthread left sleeve, Put head through opening, Pull shirt over trunk          Upper body assist Assist Level: Set up, More than reasonable time      Lower Body Dressing/Undressing Lower body dressing   What is the patient wearing?: Pants   Underwear - Performed by helper: Thread/unthread right underwear leg, Thread/unthread left underwear leg, Pull underwear up/down (To don briefs d/t incontinence) Pants- Performed by patient: Thread/unthread left pants leg, Pull pants up/down Pants- Performed by helper: Thread/unthread right pants leg Non-skid slipper socks- Performed by patient: Don/doff right sock, Don/doff left sock Non-skid slipper socks- Performed by helper: Don/doff right sock, Don/doff left sock (due to unaboots applied)       Shoes - Performed by helper: Don/doff right shoe, Don/doff left shoe, Fasten right, Fasten left          Lower body assist Assist for lower body dressing: Touching  or steadying assistance (Pt > 75%)      Toileting Toileting Toileting activity did not occur: Refused Toileting steps completed by patient: Adjust clothing prior to toileting, Adjust clothing after toileting Toileting steps completed by helper: Performs perineal hygiene (Pt wearing hospital gown) Toileting Assistive Devices: Grab bar or rail  Toileting assist Assist level:  Supervision or verbal cues   Transfers Chair/bed transfer Chair/bed transfer activity did not occur: Safety/medical concerns Chair/bed transfer method: Stand pivot, Ambulatory Chair/bed transfer assist level: Moderate assist (Pt 50 - 74%/lift or lower) Chair/bed transfer assistive device: Armrests, Walker Mechanical lift: Ecologist     Max distance: 65 ft Assist level: Supervision or verbal cues   Wheelchair   Type: Manual Max wheelchair distance: 150 ft Assist Level: Supervision or verbal cues  Cognition Comprehension Comprehension assist level: Follows basic conversation/direction with no assist  Expression Expression assist level: Expresses basic 90% of the time/requires cueing < 10% of the time.  Social Interaction Social Interaction assist level: Interacts appropriately 90% of the time - Needs monitoring or encouragement for participation or interaction.  Problem Solving Problem solving assist level: Solves basic 75 - 89% of the time/requires cueing 10 - 24% of the time  Memory Memory assist level: Recognizes or recalls 75 - 89% of the time/requires cueing 10 - 24% of the time    Medical Problem List and Plan: 1. 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 Continue interferon as directed  Cont CIR therapies  Will likely need SNF placement as pt's husband is still in hospital with ?disposition.   2. DVT Prophylaxis/Anticoagulation: Subcutaneous Lovenox. Monitor for any bleeding episodes 3. Pain Management/chronic pain:Baclofen 20 mg 3 times a day, Duragesic patch 50 g, oxycodone as needed 4. Mood: Provide emotional support 5. Neuropsych: This patient iscapable of making decisions on herown behalf. 6. Skin/Wound Care: Routine skin care bilateral lower extremities, stage 1 sacral 7. Fluids/Electrolytes/Nutrition: Routine I&Os  BMP within acceptable rage 4/17 8.Neurogenic bladder/DSD. Completecourse of Keflex.   Pt  reports very overactive bladder at home  ContinueTimed voiding, oob to void  Flomax started 4/16 d/ced 4/22  Lasix 20mg  qhs started 4/16, changed to daily, d/ced 4/22  Ditropan 5mg  TID   Ditropan 10mg  qhs started 4/24,  (home dose 20)  Monitor with treatment of UTI  Ucx+ Enterobacter  Macrobid changed to Cipro 4/25-45/1 9.Seizure disorder. Keppra 500 mg twice a day 10.Constipation. 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 (tension PRAFO RLE---needs to be taut---instructed pt/RN)  aggressive ROM with therapy  Will consider Botox as outpt 12. Chronic lymphedema--continue compression wraps. Pt has Unna boots at home  [place] consulted 13. Mild hypoalbuminemia  Cont to monitor 14. ABLA  Hb 12.3 on 4/17  Cont to monitor 15. Thrombocytopenia  Plts 111 on 4/17  Cont to monitor 16. Constipation/Neurogenic bowel  Increased bowel reg 4/13, again 4/16, again 4/19 17. Herpes Zoster  Valcyclovir completed 4/16-4/23 18. Enterobacter UTI  Ucx+ Enterobacter  Macrobid changed to Cipro 4/25-45/1  LOS (Days) 15 A FACE TO FACE EVALUATION WAS PERFORMED  Haston Casebolt Lorie Phenix 06/01/2016 9:50 AM

## 2016-06-01 NOTE — Plan of Care (Signed)
Problem: RH Bathing Goal: LTG Patient will bathe with assist, cues/equipment (OT) LTG: Patient will bathe specified number of body parts with assist with/without cues using equipment (position)  (OT)  Outcome: Not Met (add Reason) Needs mod assist for peri area  Problem: RH Dressing Goal: LTG Patient will perform lower body dressing w/assist (OT) LTG: Patient will perform lower body dressing with assist, with/without cues in positioning using equipment (OT)  Outcome: Not Met (add Reason) Needs mod assist  Problem: RH Furniture Transfers Goal: LTG Patient will perform furniture transfers w/assist (OT/PT LTG: Patient will perform furniture transfers  with assistance (OT/PT).  Outcome: Not Met (add Reason) Needs min to mod assist.

## 2016-06-01 NOTE — Consult Note (Signed)
Neuropsychological Consultation   Patient:   Rebecca [name]   DOB:   02-04-1961  MR Number:  [id number]  Location:  [facility] [address] [id number] [place] [address] Dept: [phone] MEQ: [phone]           Date of Service:   06/01/2016  Start Time:   8 PM End Time:   9 PM  Provider/Observer:  Ilean Skill, Psy.D.       Clinical Neuropsychologist       Billing Code/Service: [phone] 4 Units  Chief Complaint:    The patient has had stressors related to her inpatient admission and acute worsening of MS likely related to UTI and the ED admission of husband due to possible suicide attempt.  Reason for Service:  Rebecca [name] is a 56 year old right handed female with extensive history of chronic pain, lymphedema of both lower extremities, aortic valve disease, right breast cancer November 2015 with chemotherapy, seizure disorder, multiple sclerosis diagnosed 2001 maintained on interferon. The patient presented on 05/14/2016 to [facility] ED with bilateral leg weakness that had persisted over past 4-5 days.  No falls reported.  Suspect UTI and admitted to comprehensive rehabilitation program.    Current Status:  The patient has been dealing with coping issues due to recent events with acute onset of weakness and subsequent hospitalization and then Husband having apparent suicide attempt and his ED placement.    Reliability of Information: Information derived from review of medical chart, discussion with treatment team and 1 hour face to face with patient.  Behavioral Observation: [name]  presents as a 56 y.o.-year-old Right Caucasian Female who appeared her stated age. her dress was Appropriate and she was Well Groomed and her manners were Appropriate to the situation.  her participation was indicative of Attentive behaviors.  There were physical disabilities noted due to leg weakness.  she displayed  an appropriate level of cooperation and motivation.     Interactions:    Active Attentive  Attention:   within normal limits and attention span and concentration were age appropriate  Memory:   within normal limits; recent and remote memory intact  Visuo-spatial:  within normal limits  Speech (Volume):  low  Speech:   normal; normal  Thought Process:  Coherent and Relevant  Though Content:  Rumination; worried about husband and longterm plan for self  Orientation:   person, place, time/date and situation  Judgment:   Fair  Planning:   Poor  Affect:    Anxious and Depressed  Mood:    Depressed  Insight:   Shallow  Intelligence:   normal  Marital Status/Living: Patient married and no kids.  Husband likely substance abuse and recent suicide gesture/attempt  Current Employment: disabled  Medical History:   Past Medical History:  Diagnosis Date  . Aortic valve disease    Mild AS / AI - most recent Echo demonstrated tricuspid aortic valve.  . Bacterial endocarditis    History of .  Marland Kitchen Bilateral lower extremity edema    Noncardiac.  Chronic. LE Venous dopplers - negative for DVT.; Echocardiogram January 2016: Normal EF with normal wall motion and valve function. Only grade 1 diastolic dysfunction. EF 60-65%. Mild MR  . Breast cancer ([place]) 12-31-13   Right breast, 12:00, 1.5 cm, T1c,N0 invasive mammary carcinoma, triple negative. --> Rx with Chemo  . Cervical stenosis of spine   . Herpes zoster   . IBS (irritable bowel  syndrome)   . Lymphedema    has legs wrapped at [facility]  . Multiple sclerosis ([place]) 2001   Walks from room to room @ home; but Wheelchair when going out.  Marland Kitchen Neuromuscular disorder ([place])    MS  . Seizures ([place])    Takes Keppra  . Syncope and collapse    Family Med/Psych History:  Family History  Problem Relation Age of Onset  . Cancer Father     skin  . Heart disease Father   . Heart attack Father     heart attack in his 74's  . Thyroid disease  Sister   . Ovarian cancer Cousin   . Breast cancer Maternal Aunt 60  . Breast cancer Maternal Grandmother 74    Risk of Suicide/Violence: low Patient denies current SI ideation but husband is reported to have had recent suicide attempt or at least gesture.    Impression/DX:  The patient is a 56 year old female with stable MS and likely UTI with leg weakness.  Depression symptoms likely due to adjustment and stress although recent improvement due to physical and occupational therapy has really helped her.  Husband major stressor but she is likely part of a co dependent enabling relationship.  Diagnosis:    Multiple sclerosis exacerbation ([place]) - Plan: Ambulatory referral to Physical Medicine Rehab         Electronically Signed   _______________________ Ilean Skill, Psy.D.

## 2016-06-01 NOTE — Progress Notes (Signed)
Social Work Patient ID: Rebecca [name], female   DOB: 03-24-1960, 56 y.o.   MRN: [id number]  NH bed offered via [facility] and [facility], pt has chosen [facility] due to close To their home. Have contacted Tiffany at [facility] and plan for her to transfer tomorrow. Will contact her in the am regarding what time tomorrow. Pt is aware she will need to sign herself in since husband Hospitalized at [facility]. Let team and MD/PA know of transfer tomorrow.

## 2016-06-01 NOTE — Progress Notes (Signed)
Physical Therapy Session Note  Patient Details  Name: Rebecca [name] MRN: [id number] Date of Birth: 09-12-1960  Today's Date: 06/01/2016 PT Individual Time: 0915-1015 PT Individual Time Calculation (min): 60 min   Short Term Goals: Week 2:  PT Short Term Goal 1 (Week 2): = LTGs due to anticipated LOS  Skilled Therapeutic Interventions/Progress Updates:   Patient in bed upon arrival. Transferred to EOB using rail with HOB raised and supervision. Patient initially reporting no need to toilet but decided to attempt anyway and as soon as patient was in bathroom she reported urgency. Discussed timed toileting with RN and nurse tech and updated safety plan. [name] for time conservation to transfer from bed > commode > wheelchair with supervision and verbal cues to allow B knee flexion when sitting in [place]. Patient performed hygiene in sitting with setup assist and assist for clothing management in standing. Performed UB dressing with setup assist and increased time and assist for threading BLE for LB dressing. Patient pulled up pants with single UE support on RW and close supervision. Sit <> stand transfers using RW with min A and max verbal/tactile cues for anterior weight shift. Gait training using RW x 100 ft with supervision and increased time. Utilized Dynavision to challenge dynamic standing balance with single UE support x 3 min using RW with score of 42 and 4.29 m/s avg reaction time. Patient with increased difficulty reaching to upper left quadrant and required max multimodal cues for safe positioning of RW and to facilitate downward pressure through RW for stability, and upright posture with hip extension to neutral to prevent posterior LOB. Patient propelled wheelchair back to room using BUE with distant supervision and left sitting in wheelchair with all needs within reach.    Therapy Documentation Precautions:  Precautions Precautions: Fall Precaution Comments: R PF  contracture Restrictions Weight Bearing Restrictions: No Pain:  Denies pain  See Function Navigator for Current Functional Status.   Therapy/Group: Individual Therapy  [name], Murray Hodgkins 06/01/2016, 10:38 AM

## 2016-06-01 NOTE — Progress Notes (Signed)
Occupational Therapy Session Note  Patient Details  Name: Rebecca [name] MRN: [id number] Date of Birth: 06/11/1960  Today's Date: 06/01/2016 OT Individual Time: 1102-1202 OT Individual Time Calculation (min): 60 min    Short Term Goals: Week 2:  OT Short Term Goal 1 (Week 2): Continue working on established LTGs set at supervision to min assist level.    Skilled Therapeutic Interventions/Progress Updates:    Pt worked on bathing and dressing sit to stand at the sink.  Mod assist for sit to stand for washing peri area and pulling pants over hips.  Utilized LH sponge for washing back as well as reacher and sockaide for threading brief, pants, and socks.  Max assist still needed for threading pants but only min for donning socks with use of the sockaide.  Pt still with increased posterior LOB in standing and needs increased time to complete all tasks.  Pt left in wheelchair at end of session with call button and phone in reach.     Therapy Documentation Precautions:  Precautions Precautions: Fall Precaution Comments: R PF contracture Restrictions Weight Bearing Restrictions: No  Pain: Pain Assessment Pain Assessment: No/denies pain ADL: See Function Navigator for Current Functional Status.   Therapy/Group: Individual Therapy  Bohdan Macho OTR/L 06/01/2016, 2:24 PM

## 2016-06-01 NOTE — Patient Care Conference (Signed)
Inpatient RehabilitationTeam Conference and Plan of Care Update Date: 06/01/2016   Time: 2:15 PM    Patient Name: Rebecca [name]      Medical Record Number: [id number]  Date of Birth: May 30, 1960 Sex: Female         Room/Bed: 4W23C/4W23C-01 Payor Info: Payor: MEDICARE / Plan: MEDICARE PART A AND B / Product Type: *No Product type* /    Admitting Diagnosis: Ms Exacerbation  Admit Date/Time:  05/17/2016  1:33 PM Admission Comments: No comment available   Primary Diagnosis:  Multiple sclerosis exacerbation ([place]) Principal Problem: Multiple sclerosis exacerbation ([place])  Patient Active Problem List   Diagnosis Date Noted  . Adjustment disorder with mixed anxiety and depressed mood   . Neurogenic bladder   . Acute lower UTI   . Neurogenic bowel   . Herpes zoster without complication   . Detrusor and sphincter dyssynergia   . Urinary incontinence   . Seizure disorder ([place])   . Slow transit constipation   . 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 Lymphedema   . Hypoalbuminemia due to protein-calorie malnutrition ([place])   . Acute blood loss anemia   . Multiple sclerosis exacerbation ([place]) 05/17/2016  . Encounter for screening for cervical cancer  11/27/2015  . Preventative health care 11/25/2015  . History of breast cancer in female 10/28/2015  . Anemia 10/28/2015  . Thrombocytopenia ([place]) 10/28/2015  . Allergic rhinitis 10/28/2015  . IBS (irritable bowel syndrome) 10/28/2015  . Encounter for eye exam 08/25/2015  . Aortic valve disease   . 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 History of right mastectomy 10/24/2014  . Need for immunization against influenza 10/24/2014  . Medicare annual wellness visit, subsequent 10/24/2014  . MS (multiple sclerosis) ([place]) 09/16/2014  . Hematoma complicating a procedure 03/06/2014  . 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 SOB (shortness of breath) 03/01/2013  . Bilateral lower extremity edema   . 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[place]) 06/08/2011   . Herpes zoster 06/08/2011    Expected Discharge Date: Expected Discharge Date: 06/02/16  Team Members Present: Physician leading conference: Dr. Delice Lesch Social Worker Present: Ovidio Kin, LCSW Nurse Present: Heather Roberts, RN PT Present: Carney Living, PT OT Present: Clyda Greener, OT SLP Present: Stormy Fabian, SLP PPS Coordinator present : Daiva Nakayama, RN, CRRN     Current Status/Progress Goal Weekly Team Focus  Medical    Decreased functional mobility paraparesis secondary to MS exacerbation/UTI.   Improve mobility, safety, UTI, neurogenic bladder  See above   Bowel/Bladder   patient is incontinent of bladder, no bowel movement this shift  to have full control of her bladder  perform toileting schedule and provide assistance   Swallow/Nutrition/ Hydration             ADL's   supervision for UB selfcare, MIN A for bathing sit to stand with MOD A for LB dressing  supervision to min assist level  selfcare retraining, blance, transfer training, NMR, pt/fam ed   Mobility   min-mod A sit <> stand, min A stairs with 2 rails, supervision ambulation using RW, min A bed mobility, mod A car transfer  downgraded to min A overall, mod A car transfer  functional mobility, R > L LE NMR, activity tolerance, standing balance, postural control, pt education   Communication             Safety/Cognition/ Behavioral Observations            Pain   denies pain and  discomfort this shift  pain of 0-2  assess for pain q shift and prn   Skin                *See Care Plan and progress notes for long and short-term goals.  Barriers to Discharge: Neurogenic bladder, thrombocytopenia, safety, mobility, UTI, urinary freq    Possible Resolutions to Barriers:  Therapies, follow labs, adjusting bladder meds, abx    Discharge Planning/Teaching Needs:  Husband is in the hospital and will now need to pursue NHP for pt until husband is safe to provide care to pt at home      Team Discussion:   Continuing to progress in therapies, now NHP due to husband currently in the hospital and she has no caregiver. Downgraded goals to min assist level. Working continence with bladder. MD addressing with increased meds.has NH bed for tomorrow.  Revisions to Treatment Plan:  DC 4/26 to NH   Continued Need for Acute Rehabilitation Level of Care: The patient requires daily medical management by a physician with specialized training in physical medicine and rehabilitation for the following conditions: Daily direction of a multidisciplinary physical rehabilitation program to ensure safe treatment while eliciting the highest outcome that is of practical value to the patient.: Yes Daily medical management of patient stability for increased activity during participation in an intensive rehabilitation regime.: Yes Daily analysis of laboratory values and/or radiology reports with any subsequent need for medication adjustment of medical intervention for : Neurological problems;Urological problems;Other  [name] 06/02/2016, 8:43 AM

## 2016-06-01 NOTE — Progress Notes (Signed)
Occupational Therapy Discharge Summary  Patient Details  Name: Rebecca [name] MRN: [id number] Date of Birth: [birth year]  Today's Date: 06/01/2016 OT Individual Time: 9211-9417 OT Individual Time Calculation (min): 31 min    Session Note:  Pt worked on function reaching in sitting to place clothespins on horizontal bar.  Overall supervision for reaching unilaterally to approximately 110 degrees flexion but needed min assist if asked to reach bilaterally to the same height.  Mod demonstrational cueing needed to maintain anterior pelvic tilt and upright posture during reaching.  Also incorporated functional mobility from the therapy mat out into the hallway.  Min assist for initial sit to stand from the mat with min guard for mobility, with pt still exhibiting decreased trunk and pelvic extension during this as well limited stepping distance.  Pt left in wheelchair at end of session with therapist helping to push her back to the room.  Call button and phone in reach.    Patient has met 7 of 10 long term goals due to improved balance, postural control and ability to compensate for deficits.  Patient to discharge at overall Mod Assist level.  Patient's care partner unavailable to provide the necessary physical and cognitive assistance at discharge.    Reasons goals not met: Pt continues to need min to mod assist for bathing and dressing tasks.    Recommendation:  Patient will benefit from ongoing skilled OT services in skilled nursing facility setting to continue to advance functional skills in the area of BADL and Reduce care partner burden.  Pt continues to need min to mod assist overall for transfers as well as mod to max for LB selfcare sit to stand.  Her spouse cannot provide 24 hour assist at this current time secondary to his own hospitalization so feel SNF is best option for continued OT rehab.     Equipment: No equipment provided  Reasons for discharge: treatment goals met and discharge from  hospital  Patient/family agrees with progress made and goals achieved: Yes  OT Discharge Precautions/Restrictions  Precautions Precautions: Fall Precaution Comments: R PF contracture Restrictions Weight Bearing Restrictions: No   Vital Signs Therapy Vitals Temp: 98 F (36.7 C) Temp Source: Oral Pulse Rate: 98 Resp: 18 BP: 125/68 Patient Position (if appropriate): Sitting Oxygen Therapy SpO2: 98 % O2 Device: Not Delivered Pain Pain Assessment Pain Assessment: No/denies pain ADL  See Function section of chart for details  Vision/Perception  Vision- History Baseline Vision/History: Wears glasses Wears Glasses: At all times Patient Visual Report: No change from baseline Vision- Assessment Vision Assessment?: No apparent visual deficits Perception Perception: Within Functional Limits  Cognition Overall Cognitive Status: Within Functional Limits for tasks assessed Arousal/Alertness: Awake/alert Orientation Level: Oriented X4 Attention: Sustained Sustained Attention: Appears intact Memory: Appears intact Awareness: Appears intact Problem Solving: Appears intact Safety/Judgment: Appears intact Sensation Sensation Light Touch: Appears Intact Stereognosis: Appears Intact Hot/Cold: Appears Intact Proprioception: Appears Intact Additional Comments: Sensation intact in BUEs Coordination Gross Motor Movements are Fluid and Coordinated: No Fine Motor Movements are Fluid and Coordinated: No Coordination and Movement Description: Slower movements noted with UE FM coordination during selfcare tasks and with self feeding compared to normal. Motor  Motor Motor: Abnormal postural alignment and control;Abnormal tone Motor - Skilled Clinical Observations: Increased tone in the left knee extensors.  BLE weakness for sit to stand, standing balance during ADL. Mobility  Transfers Sit to Stand: From bed;With upper extremity assist;3: Mod assist;4: Min assist Stand to Sit: 4:  Min assist;With armrests;With upper  extremity assist  Trunk/Postural Assessment  Cervical Assessment Cervical Assessment: Exceptions to [facility] (limited cervical extension AROM, forward head at rest) Thoracic Assessment Thoracic Assessment: Exceptions to [facility] (kyphotic posturing with rounded shoulder abduction at rest) Lumbar Assessment Lumbar Assessment: Exceptions to [facility] (Posterior pelvic tilt in sitting)  Balance Balance Balance Assessed: Yes Static Sitting Balance Static Sitting - Balance Support: Feet supported Static Sitting - Level of Assistance: 7: Independent Dynamic Sitting Balance Dynamic Sitting - Balance Support: During functional activity;Feet supported Dynamic Sitting - Level of Assistance: 5: Stand by assistance Sitting balance - Comments: occasional loss of balance when using performing seated tasks without UE support.  Static Standing Balance Static Standing - Balance Support: During functional activity;Bilateral upper extremity supported Static Standing - Level of Assistance: 4: Min assist (Posterior LOB) Dynamic Standing Balance Dynamic Standing - Balance Support: During functional activity;Bilateral upper extremity supported Dynamic Standing - Level of Assistance: 4: Min assist Extremity/Trunk Assessment RUE Assessment RUE Assessment: Exceptions to [facility] RUE Strength RUE Overall Strength Comments: AROM shoulder flexion 0-115 degrees secondary to trunk posturing and scapular mobility.  All other joints AROM WFLS  Overall strength 3+/5 throughout.   LUE Assessment LUE Assessment: Exceptions to [facility] LUE Strength LUE Overall Strength Comments: AROM shoulder flexion 0-115 degrees secondary to trunk posturing and scapular mobility.  All other joints AROM WFLS  Overall strength 3+/5 throughout.     See Function Navigator for Current Functional Status.  Annelie Boak OTR/L 06/01/2016, 5:49 PM

## 2016-06-01 NOTE — Plan of Care (Signed)
Problem: RH BOWEL ELIMINATION Goal: RH STG MANAGE BOWEL W/MEDICATION W/ASSISTANCE STG Manage Bowel with Medication with mod I Assistance.  Outcome: Progressing No Bm this shift  Problem: RH BLADDER ELIMINATION Goal: RH STG MANAGE BLADDER WITH ASSISTANCE STG Manage Bladder With mod I Assistance  Outcome: Not Progressing Incontinent of bladder   Problem: RH SKIN INTEGRITY Goal: RH STG SKIN FREE OF INFECTION/BREAKDOWN Skin to remain free from infection and breakdown while on rehab with min assist from staff.  Outcome: Progressing No signs of skin infection noted Goal: RH STG MAINTAIN SKIN INTEGRITY WITH ASSISTANCE STG Maintain Skin Integrity With mod I Assistance.  Outcome: Progressing Skin care performed, blister is opened, dry and healing  Problem: RH SAFETY Goal: RH STG ADHERE TO SAFETY PRECAUTIONS W/ASSISTANCE/DEVICE STG Adhere to Safety Precautions With min. Assistance/Device.   Outcome: Progressing Patient is aware safety issues  Problem: RH PAIN MANAGEMENT Goal: RH STG PAIN MANAGED AT OR BELOW PT'S PAIN GOAL Pain < 2 on pain scale  Outcome: Progressing Denies pain and discomfort this shift

## 2016-06-01 NOTE — Progress Notes (Signed)
Physical Therapy Discharge Summary  Patient Details  Name: Rebecca [name] MRN: [id number] Date of Birth: 04-27-1960  Today's Date: 06/01/2016 PT Individual Time: 5284-1324 PT Individual Time Calculation (min): 64 min    Patient has met 5 of 6 long term goals due to improved activity tolerance, improved balance, improved postural control, increased strength, ability to compensate for deficits and functional use of  right upper extremity, right lower extremity, left upper extremity and left lower extremity.  Patient to discharge at an ambulatory level [place].   Patient's care partner unavailable to provide the necessary physical assistance at discharge.  Reasons goals not met: Pt did not perform anticipated furniture transfers.   Recommendation:  Patient will benefit from ongoing skilled PT services in skilled nursing facility setting to continue to advance safe functional mobility, address ongoing impairments in strength, endurance, range of motion, balance, gait, and general functional mobility to minimize fall risk and increase independence.  Equipment: No equipment provided  Reasons for discharge: treatment goals met  Patient/family agrees with progress made and goals achieved: Yes  PT Discharge Precautions/Restrictions Precautions Precautions: Fall Precaution Comments: R PF contracture Restrictions Weight Bearing Restrictions: No Pain  Pt denies pain.  Vision/Perception  Vision - History Baseline Vision: Wears glasses all the time Perception Perception: Within Functional Limits  Cognition Overall Cognitive Status: Within Functional Limits for tasks assessed Arousal/Alertness: Awake/alert Orientation Level: Oriented X4 Sensation Sensation Light Touch: Appears Intact Coordination Gross Motor Movements are Fluid and Coordinated: No Fine Motor Movements are Fluid and Coordinated: No Coordination and Movement Description: decreased coordination with fine and gross  LEs Motor  Motor Motor: Abnormal postural alignment and control;Abnormal tone Motor - Skilled Clinical Observations: Increased tone in the left knee extensors.  BLE weakness for sit to stand, standing balance during ADL.  Balance Balance Balance Assessed: Yes Static Sitting Balance Static Sitting - Level of Assistance: 7: Independent Dynamic Sitting Balance Dynamic Sitting - Level of Assistance: 5: Stand by assistance Sitting balance - Comments: occasional loss of balance when using performing seated tasks without UE support.  Static Standing Balance Static Standing - Balance Support: Bilateral upper extremity supported;During functional activity Static Standing - Level of Assistance: 5: Stand by assistance Dynamic Standing Balance Dynamic Standing - Balance Support: During functional activity Dynamic Standing - Level of Assistance: 4: Min assist Extremity Assessment  RUE Strength RUE Overall Strength Comments: gross decreased strength LUE Assessment LUE Assessment: Exceptions to [facility] LUE Strength LUE Overall Strength Comments: decreased gross strength RLE Assessment RLE Assessment: Exceptions to [facility] RLE Strength RLE Overall Strength Comments: no active ankle DF noted, trace hip flexion, knee flexion.  RLE Tone RLE Tone: Severe RLE Tone Comments: extensor tone 3/4 LLE Assessment LLE Assessment: Exceptions to WFL LLE Strength LLE Overall Strength Comments: grossly 2+/5 LLE Tone LLE Tone: Moderate LLE Tone Comments: extensor tone 2/4   See Function Navigator for Current Functional Status.  Linard Millers, PT 06/01/2016, 3:44 PM

## 2016-06-02 DIAGNOSIS — N3289 Other specified disorders of bladder: Secondary | ICD-10-CM | POA: Diagnosis not present

## 2016-06-02 DIAGNOSIS — G801 Spastic diplegic cerebral palsy: Secondary | ICD-10-CM | POA: Diagnosis not present

## 2016-06-02 DIAGNOSIS — G35 Multiple sclerosis: Secondary | ICD-10-CM | POA: Diagnosis not present

## 2016-06-02 DIAGNOSIS — K5900 Constipation, unspecified: Secondary | ICD-10-CM | POA: Diagnosis not present

## 2016-06-02 DIAGNOSIS — K589 Irritable bowel syndrome without diarrhea: Secondary | ICD-10-CM | POA: Diagnosis not present

## 2016-06-02 DIAGNOSIS — I359 Nonrheumatic aortic valve disorder, unspecified: Secondary | ICD-10-CM | POA: Diagnosis not present

## 2016-06-02 DIAGNOSIS — N319 Neuromuscular dysfunction of bladder, unspecified: Secondary | ICD-10-CM | POA: Diagnosis not present

## 2016-06-02 DIAGNOSIS — Z7901 Long term (current) use of anticoagulants: Secondary | ICD-10-CM | POA: Diagnosis not present

## 2016-06-02 DIAGNOSIS — G894 Chronic pain syndrome: Secondary | ICD-10-CM | POA: Diagnosis not present

## 2016-06-02 DIAGNOSIS — I890 Lymphedema, not elsewhere classified: Secondary | ICD-10-CM | POA: Diagnosis not present

## 2016-06-02 DIAGNOSIS — D62 Acute posthemorrhagic anemia: Secondary | ICD-10-CM | POA: Diagnosis not present

## 2016-06-02 DIAGNOSIS — N310 Uninhibited neuropathic bladder, not elsewhere classified: Secondary | ICD-10-CM | POA: Diagnosis not present

## 2016-06-02 DIAGNOSIS — G40909 Epilepsy, unspecified, not intractable, without status epilepticus: Secondary | ICD-10-CM | POA: Diagnosis not present

## 2016-06-02 DIAGNOSIS — G8220 Paraplegia, unspecified: Secondary | ICD-10-CM | POA: Diagnosis not present

## 2016-06-02 DIAGNOSIS — D696 Thrombocytopenia, unspecified: Secondary | ICD-10-CM | POA: Diagnosis not present

## 2016-06-02 DIAGNOSIS — Z9109 Other allergy status, other than to drugs and biological substances: Secondary | ICD-10-CM | POA: Diagnosis not present

## 2016-06-02 DIAGNOSIS — N3644 Muscular disorders of urethra: Secondary | ICD-10-CM | POA: Diagnosis not present

## 2016-06-02 DIAGNOSIS — N3000 Acute cystitis without hematuria: Secondary | ICD-10-CM | POA: Diagnosis not present

## 2016-06-02 DIAGNOSIS — G40209 Localization-related (focal) (partial) symptomatic epilepsy and epileptic syndromes with complex partial seizures, not intractable, without status epilepticus: Secondary | ICD-10-CM | POA: Diagnosis not present

## 2016-06-02 DIAGNOSIS — N390 Urinary tract infection, site not specified: Secondary | ICD-10-CM | POA: Diagnosis not present

## 2016-06-02 DIAGNOSIS — R4182 Altered mental status, unspecified: Secondary | ICD-10-CM | POA: Diagnosis not present

## 2016-06-02 DIAGNOSIS — F4323 Adjustment disorder with mixed anxiety and depressed mood: Secondary | ICD-10-CM | POA: Diagnosis not present

## 2016-06-02 DIAGNOSIS — B952 Enterococcus as the cause of diseases classified elsewhere: Secondary | ICD-10-CM | POA: Diagnosis not present

## 2016-06-02 DIAGNOSIS — Z853 Personal history of malignant neoplasm of breast: Secondary | ICD-10-CM | POA: Diagnosis not present

## 2016-06-02 DIAGNOSIS — M4802 Spinal stenosis, cervical region: Secondary | ICD-10-CM | POA: Diagnosis not present

## 2016-06-02 DIAGNOSIS — R569 Unspecified convulsions: Secondary | ICD-10-CM | POA: Diagnosis not present

## 2016-06-02 LAB — CBC WITH DIFFERENTIAL/PLATELET
Basophils Absolute: 0 10*3/uL (ref 0.0–0.1)
Basophils Relative: 0 %
EOS PCT: 3 %
Eosinophils Absolute: 0.1 10*3/uL (ref 0.0–0.7)
HCT: 36.1 % (ref 36.0–46.0)
Hemoglobin: 11.8 g/dL — ABNORMAL LOW (ref 12.0–15.0)
LYMPHS PCT: 26 %
Lymphs Abs: 0.8 10*3/uL (ref 0.7–4.0)
MCH: 30.1 pg (ref 26.0–34.0)
MCHC: 32.7 g/dL (ref 30.0–36.0)
MCV: 92.1 fL (ref 78.0–100.0)
MONO ABS: 0.3 10*3/uL (ref 0.1–1.0)
MONOS PCT: 8 %
Neutro Abs: 1.9 10*3/uL (ref 1.7–7.7)
Neutrophils Relative %: 63 %
PLATELETS: 133 10*3/uL — AB (ref 150–400)
RBC: 3.92 MIL/uL (ref 3.87–5.11)
RDW: 12.8 % (ref 11.5–15.5)
WBC: 3.1 10*3/uL — ABNORMAL LOW (ref 4.0–10.5)

## 2016-06-02 LAB — BASIC METABOLIC PANEL
Anion gap: 8 (ref 5–15)
BUN: 10 mg/dL (ref 6–20)
CHLORIDE: 104 mmol/L (ref 101–111)
CO2: 28 mmol/L (ref 22–32)
Calcium: 9.5 mg/dL (ref 8.9–10.3)
Creatinine, Ser: 0.47 mg/dL (ref 0.44–1.00)
GFR calc Af Amer: >60 mL/min (ref >60)
GFR calc non Af Amer: >60 mL/min (ref >60)
GLUCOSE: 103 mg/dL — AB (ref 65–99)
POTASSIUM: 3.8 mmol/L (ref 3.5–5.1)
Sodium: 140 mmol/L (ref 135–145)

## 2016-06-02 MED ORDER — POLYETHYLENE GLYCOL 3350 17 G PO PACK: 17.0000 g | PACK | Freq: Two times a day (BID) | ORAL | 0 refills | Status: DC

## 2016-06-02 MED ORDER — OXYBUTYNIN CHLORIDE 5 MG PO TABS: 5.0000 mg | ORAL_TABLET | Freq: Three times a day (TID) | ORAL | Status: DC

## 2016-06-02 MED ORDER — LEVETIRACETAM 500 MG PO TABS: 500.0000 mg | ORAL_TABLET | Freq: Two times a day (BID) | ORAL | 4 refills | Status: DC

## 2016-06-02 MED ORDER — OXYBUTYNIN CHLORIDE 5 MG PO TABS: 10.0000 mg | ORAL_TABLET | Freq: Every day | ORAL | Status: DC

## 2016-06-02 MED ORDER — CIPROFLOXACIN HCL 250 MG PO TABS: 250.0000 mg | ORAL_TABLET | Freq: Two times a day (BID) | ORAL | Status: DC

## 2016-06-02 NOTE — Plan of Care (Signed)
Problem: RH BLADDER ELIMINATION Goal: RH STG MANAGE BLADDER WITH ASSISTANCE STG Manage Bladder With mod I Assistance  Outcome: Progressing Incontinent of bladder once tonight   Problem: RH SKIN INTEGRITY Goal: RH STG SKIN FREE OF INFECTION/BREAKDOWN Skin to remain free from infection and breakdown while on rehab with min assist from staff.  Outcome: Progressing No signs of skin infection noted  Problem: RH PAIN MANAGEMENT Goal: RH STG PAIN MANAGED AT OR BELOW PT'S PAIN GOAL Pain < 2 on pain scale  Outcome: Progressing Medicated once for pain with full relief

## 2016-06-02 NOTE — Progress Notes (Signed)
Report was given to [place] at [facility].Pt. ready to be transported by EMS.

## 2016-06-02 NOTE — Progress Notes (Signed)
Social Work Patient ID: Rebecca [name], female   DOB: 09-13-1960, 56 y.o.   MRN: [id number] Pt made aware plan to transport at 10:30 to [facility], [place]-RN aware. Pt reports husband may be getting out of the hospital soon.

## 2016-06-02 NOTE — Progress Notes (Signed)
Social Work  Discharge Note  The overall goal for the admission was met for:   Discharge location: Yes-[facility]-SNF  Length of Stay: Yes-16 DAYS  Discharge activity level: Yes-SUPERVISION/MIN ASSIST LEVEL  Home/community participation: Yes  Services provided included: MD, RD, PT, OT, RN, CM, TR, Pharmacy, Neuropsych and SW  Financial Services: Medicare and Medicaid  Follow-up services arranged: Other: NHP  Comments (or additional information):HUSBAND WAS ADMITTED TO [facility] AND COULD NOT PROVIDE THE CAR EPT REQUIRES. DECISION WAS TO PURSUE NHP FOR A SHORT TIME UNTIL HUSBAND GETS THE HELP HE NEEDS.  Patient/Family verbalized understanding of follow-up arrangements: Yes  Individual responsible for coordination of the follow-up plan: SELF & DON-HUSBAND  Confirmed correct DME delivered: [name] 06/02/2016    [name]

## 2016-06-02 NOTE — Progress Notes (Signed)
Social Work [name], LCSW Social Worker Signed Physical Medicine and Rehabilitation Patient Care Conference Date of Service: 06/01/2016  1:56 PM      Hide copied text Hover for attribution information Inpatient RehabilitationTeam Conference and Plan of Care Update Date: 06/01/2016   Time: 2:15 PM      Patient Name: Rebecca [name]      Medical Record Number: [id number]  Date of Birth: May 29, 1960 Sex: Female         Room/Bed: 4W23C/4W23C-01 Payor Info: Payor: MEDICARE / Plan: MEDICARE PART A AND B / Product Type: *No Product type* /     Admitting Diagnosis: Ms Exacerbation  Admit Date/Time:  05/17/2016  1:33 PM Admission Comments: No comment available    Primary Diagnosis:  Multiple sclerosis exacerbation ([place]) Principal Problem: Multiple sclerosis exacerbation ([place])       Patient Active Problem List    Diagnosis Date Noted  . Adjustment disorder with mixed anxiety and depressed mood    . Neurogenic bladder    . Acute lower UTI    . Neurogenic bowel    . Herpes zoster without complication    . Detrusor and sphincter dyssynergia    . Urinary incontinence    . Seizure disorder ([place])    . Slow transit constipation    . 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 Lymphedema    . Hypoalbuminemia due to protein-calorie malnutrition ([place])    . Acute blood loss anemia    . Multiple sclerosis exacerbation ([place]) 05/17/2016  . Encounter for screening for cervical cancer  11/27/2015  . Preventative health care 11/25/2015  . History of breast cancer in female 10/28/2015  . Anemia 10/28/2015  . Thrombocytopenia ([place]) 10/28/2015  . Allergic rhinitis 10/28/2015  . IBS (irritable bowel syndrome) 10/28/2015  . Encounter for eye exam 08/25/2015  . Aortic valve disease    . 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 History of right mastectomy 10/24/2014  . Need for immunization against influenza 10/24/2014  . Medicare annual wellness visit, subsequent 10/24/2014  . MS (multiple sclerosis) ([place]) 09/16/2014    . Hematoma complicating a procedure 03/06/2014  . Primary cancer of right female breast ([place]) 01/10/2014  . SOB (shortness of breath) 03/01/2013  . Bilateral lower extremity edema    . 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 Herpes zoster 06/08/2011      Expected Discharge Date: Expected Discharge Date: 06/02/16   Team Members Present: Physician leading conference: Dr. Delice Lesch Social Worker Present: Ovidio Kin, LCSW Nurse Present: Heather Roberts, RN PT Present: Carney Living, PT OT Present: Clyda Greener, OT SLP Present: Stormy Fabian, SLP PPS Coordinator present : Daiva Nakayama, RN, CRRN       Current Status/Progress Goal Weekly Team Focus  Medical      Decreased functional mobility paraparesis secondary to MS exacerbation/UTI.   Improve mobility, safety, UTI, neurogenic bladder  See above   Bowel/Bladder     patient is incontinent of bladder, no bowel movement this shift  to have full control of her bladder  perform toileting schedule and provide assistance   Swallow/Nutrition/ Hydration               ADL's     supervision for UB selfcare, MIN A for bathing sit to stand with MOD A for LB dressing  supervision to min assist level  selfcare retraining, blance, transfer training, NMR, pt/fam ed   Mobility     min-mod A sit <> stand, min A stairs  with 2 rails, supervision ambulation using RW, min A bed mobility, mod A car transfer  downgraded to min A overall, mod A car transfer  functional mobility, R > L LE NMR, activity tolerance, standing balance, postural control, pt education   Communication               Safety/Cognition/ Behavioral Observations             Pain     denies pain and discomfort this shift  pain of 0-2  assess for pain q shift and prn   Skin                 *See Care Plan and progress notes for long and short-term goals.   Barriers to Discharge: Neurogenic bladder, thrombocytopenia, safety, mobility, UTI, urinary freq     Possible  Resolutions to Barriers:  Therapies, follow labs, adjusting bladder meds, abx     Discharge Planning/Teaching Needs:  Husband is in the hospital and will now need to pursue NHP for pt until husband is safe to provide care to pt at home      Team Discussion:  Continuing to progress in therapies, now NHP due to husband currently in the hospital and she has no caregiver. Downgraded goals to min assist level. Working continence with bladder. MD addressing with increased meds.has NH bed for tomorrow.  Revisions to Treatment Plan:  DC 4/26 to NH    Continued Need for Acute Rehabilitation Level of Care: The patient requires daily medical management by a physician with specialized training in physical medicine and rehabilitation for the following conditions: Daily direction of a multidisciplinary physical rehabilitation program to ensure safe treatment while eliciting the highest outcome that is of practical value to the patient.: Yes Daily medical management of patient stability for increased activity during participation in an intensive rehabilitation regime.: Yes Daily analysis of laboratory values and/or radiology reports with any subsequent need for medication adjustment of medical intervention for : Neurological problems;Urological problems;Other   [name] 06/02/2016, 8:43 AM      [name], LCSW Social Worker Signed   Patient Care Conference Date of Service: 05/25/2016  3:09 PM      Hide copied text Hover for attribution information Inpatient RehabilitationTeam Conference and Plan of Care Update Date: 05/25/2016   Time: 2:20 PM      Patient Name: Rebecca [name]      Medical Record Number: [id number]  Date of Birth: 1960-09-09 Sex: Female         Room/Bed: 4W23C/4W23C-01 Payor Info: Payor: MEDICARE / Plan: MEDICARE PART A AND B / Product Type: *No Product type* /     Admitting Diagnosis: Ms Exacerbation  Admit Date/Time:  05/17/2016  1:33 PM Admission Comments: No  comment available    Primary Diagnosis:  <principal problem not specified> Principal Problem: <principal problem not specified>       Patient Active Problem List    Diagnosis Date Noted  . VZV (varicella-zoster virus) infection    . Detrusor and sphincter dyssynergia    . Urinary incontinence    . Seizure disorder ([place])    . Slow transit constipation    . 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 Lymphedema    . Hypoalbuminemia due to protein-calorie malnutrition ([place])    . Acute blood loss anemia    . Multiple sclerosis exacerbation ([place]) 05/17/2016  . Neurogenic bladder 05/17/2016  . Multiple sclerosis ([place]) 05/14/2016  . Encounter for screening for  cervical cancer  11/27/2015  . Preventative health care 11/25/2015  . History of breast cancer in female 10/28/2015  . Anemia 10/28/2015  . Thrombocytopenia ([place]) 10/28/2015  . Allergic rhinitis 10/28/2015  . IBS (irritable bowel syndrome) 10/28/2015  . Encounter for eye exam 08/25/2015  . Aortic valve disease    . Heart murmur 10/24/2014  . 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 Lymphedema of both lower extremities 10/24/2014  . History of right mastectomy 10/24/2014  . Need for immunization against influenza 10/24/2014  . Medicare annual wellness visit, subsequent 10/24/2014  . MS (multiple sclerosis) ([place]) 09/16/2014  . Hematoma complicating a procedure 03/06/2014  . Primary cancer of right female breast ([place]) 01/10/2014  . SOB (shortness of breath) 03/01/2013  . Bilateral lower extremity edema    . 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 Herpes zoster 06/08/2011      Expected Discharge Date: Expected Discharge Date: 05/31/16   Team Members Present: Physician leading conference: Dr. Delice Lesch Social Worker Present: Ovidio Kin, LCSW Nurse Present: Rayetta Pigg, RN PT Present: Carney Living, Dustin Folks, PT OT Present: Clyda Greener, OT PPS Coordinator present : Daiva Nakayama, RN, CRRN       Current  Status/Progress Goal Weekly Team Focus  Medical     Decreased functional mobility secondary to MS exacerbation   Improve mobility, safety, urinary freq  See above   Bowel/Bladder     patient incontinent of bowel and bladder; LBM 05/21/16  decrease number of incontinent episodes  anticipate patient's need for toileting and assist   Swallow/Nutrition/ Hydration               ADL's     supervision for UB selfcare, min assist for bathing sit to stand with mod to max for dressing tasks  supervision to min assist level  selfcare retraining, balance retraining, transfer training, neuromuscular re-education, pt/family education   Mobility     min-mod A overall  downgraded to min A overall, mod A car transfer  functional mobility, tone management, BLE NMR, standing balance, postural control, pt/family education and training   Communication               Safety/Cognition/ Behavioral Observations             Pain     occasional leg discomfort  <4  assess for pain q shift   Skin     BLE lymphedema, R mastectomy site, porta cath left chest; rash to lower buttocks  pt will be free of infection and skin breakdown  skin assessment q shift     *See Care Plan and progress notes for long and short-term goals.   Barriers to Discharge: Neurogenic bladder, thrombocytopenia, safety, mobility, VZV, urinary freq     Possible Resolutions to Barriers:  Therapies, follow labs, adjust bladder meds, antivirals     Discharge Planning/Teaching Needs:  Home with husband who has been here to participate in therapies. Husband can provide care to her he was prior to admission      Team Discussion:  Progressing toward her goals-ambulation goals downgraded to min assist level. MD monitoring labs and treating for shingles. Meds for urinary frequency are working and pt slept better last night. Husband has started training for discharge.  Revisions to Treatment Plan:  Downgraded ambulation to min assist. DC 4/24      Continued Need for Acute Rehabilitation Level of Care: The patient requires daily medical management by a physician with specialized training in physical medicine  and rehabilitation for the following conditions: Daily direction of a multidisciplinary physical rehabilitation program to ensure safe treatment while eliciting the highest outcome that is of practical value to the patient.: Yes Daily medical management of patient stability for increased activity during participation in an intensive rehabilitation regime.: Yes Daily analysis of laboratory values and/or radiology reports with any subsequent need for medication adjustment of medical intervention for : Neurological problems;Urological problems;Other;Wound care problems   [name] 05/25/2016, 3:09 PM      [name], LCSW Social Worker Signed   Patient Care Conference Date of Service: 05/19/2016  9:38 AM      Hide copied text Hover for attribution information Inpatient RehabilitationTeam Conference and Plan of Care Update Date: 05/18/2016   Time: 2:00 PM      Patient Name: Rebecca [name]      Medical Record Number: [id number]  Date of Birth: [birth year] Sex: Female         Room/Bed: 4W23C/4W23C-01 Payor Info: Payor: MEDICARE / Plan: MEDICARE PART A AND B / Product Type: *No Product type* /     Admitting Diagnosis: Ms Exacerbation  Admit Date/Time:  05/17/2016  1:33 PM Admission Comments: No comment available    Primary Diagnosis:  <principal problem not specified> Principal Problem: <principal problem not specified>       Patient Active Problem List    Diagnosis Date Noted  . Seizure disorder ([place])    . Slow transit constipation    . 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 Lymphedema    . Hypoalbuminemia due to protein-calorie malnutrition ([place])    . Acute blood loss anemia    . Multiple sclerosis exacerbation ([place]) 05/17/2016  . Neurogenic bladder 05/17/2016  . Multiple sclerosis ([place]) 05/14/2016  . Encounter for  screening for cervical cancer  11/27/2015  . Preventative health care 11/25/2015  . History of breast cancer in female 10/28/2015  . Anemia 10/28/2015  . Thrombocytopenia ([place]) 10/28/2015  . Allergic rhinitis 10/28/2015  . IBS (irritable bowel syndrome) 10/28/2015  . Encounter for eye exam 08/25/2015  . Aortic valve disease    . Heart murmur 10/24/2014  . 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 Lymphedema of both lower extremities 10/24/2014  . History of right mastectomy 10/24/2014  . Need for immunization against influenza 10/24/2014  . Medicare annual wellness visit, subsequent 10/24/2014  . MS (multiple sclerosis) ([place]) 09/16/2014  . Hematoma complicating a procedure 03/06/2014  . Primary cancer of right female breast ([place]) 01/10/2014  . SOB (shortness of breath) 03/01/2013  . Bilateral lower extremity edema    . 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 Herpes zoster 06/08/2011      Expected Discharge Date: Expected Discharge Date: 05/31/16   Team Members Present: Physician leading conference: Ilean Skill, PsyD;Dr. Delice Lesch Social Worker Present: Ovidio Kin, LCSW Nurse Present: Heather Roberts, RN PT Present: Carney Living, PT OT Present: Clyda Greener, OT SLP Present: Stormy Fabian, SLP PPS Coordinator present : Daiva Nakayama, RN, CRRN       Current Status/Progress Goal Weekly Team Focus  Medical     Decreased functional mobility secondary to MS exacerbation/UTI.   Improve mobility, safety, self-care  See above   Bowel/Bladder     patient is incontinenet of bowel & bladder, LBM 05/16/16  continence with min assist  assess for pattern & assist as needed   Swallow/Nutrition/ Hydration               ADL's  supervision for UB bathing with mod assist for LB bathing sit to stand.    supervision to min assist level  selfcare retraining, balance retraining, transfer training, neuromuscular re-education, pt/family education   Mobility     mod-max A bed mobility,  min A stand pivot using RW with significantly increased time  supervision household  functional mobility, tone management, BLE NMR, activity tolerance, standing balance, postural control, balance strategies, pt/fam education   Communication               Safety/Cognition/ Behavioral Observations             Pain     no c/o pain  pain scale <4  assess & treat as needed   Skin     BLE lymphedema, right mastectomy site, port cath left chest  no new areas of skin breakdown  assess q shift     *See Care Plan and progress notes for long and short-term goals.   Barriers to Discharge: Neurogenic bladder, ABLA, thrombocytopenia, safety, mobility     Possible Resolutions to Barriers:  Therapies, follow labs, PVRs     Discharge Planning/Teaching Needs:    Home with husband who was assisting prior to admission. He will be able to provide 24 hr supervision at discharge.     Team Discussion:  New evaluation-goals-supervision level. Prafo boots ordered. MD-watching labs, neurogenic bladder-RN aware. Currently min-mod level of assist. Weakness B-LE  Revisions to Treatment Plan:  New eval    Continued Need for Acute Rehabilitation Level of Care: The patient requires daily medical management by a physician with specialized training in physical medicine and rehabilitation for the following conditions: Daily direction of a multidisciplinary physical rehabilitation program to ensure safe treatment while eliciting the highest outcome that is of practical value to the patient.: Yes Daily medical management of patient stability for increased activity during participation in an intensive rehabilitation regime.: Yes Daily analysis of laboratory values and/or radiology reports with any subsequent need for medication adjustment of medical intervention for : Neurological problems;Urological problems;Other   [name] 05/19/2016, 9:38 AM       Patient ID: Rebecca [name], female   DOB: [birth year], 56  y.o.   MRN: [id number]

## 2016-06-02 NOTE — Progress Notes (Signed)
[facility] PHYSICAL MEDICINE & REHABILITATION     PROGRESS NOTE  Subjective/Complaints:  Pt seen laying in bed this AM.  She complains of urinary freq overnight.   ROS: +Urinary freq. Denies CP, SOB, N/V/D.  Objective: Vital Signs: Blood pressure 117/73, pulse 72, temperature 98.1 F (36.7 C), temperature source Oral, resp. rate 18, height 5\' 4"  (1.626 m), weight 87.5 kg (193 lb), SpO2 97 %. No results found.  Recent Labs  06/02/16 0427  WBC 3.1*  HGB 11.8*  HCT 36.1  PLT 133*    Recent Labs  06/02/16 0427  NA 140  K 3.8  CL 104  GLUCOSE 103*  BUN 10  CREATININE 0.47  CALCIUM 9.5   CBG (last 3)   Recent Labs  06/01/16 2141  GLUCAP 103*    Wt Readings from Last 3 Encounters:  05/20/16 87.5 kg (193 lb)  05/14/16 79.4 kg (175 lb)  11/25/15 77.1 kg (170 lb)    Physical Exam:  BP 117/73 (BP Location: Left Arm)   Pulse 72   Temp 98.1 F (36.7 C) (Oral)   Resp 18   Ht 5\' 4"  (1.626 m)   Wt 87.5 kg (193 lb)   SpO2 97%   BMI 33.13 kg/m  Gen: Well developed. NAD. Vital signs reviewed. Head: Normocephalic. Atraumatic. Eyes: EOMI. No discharge Cardiovascular: RRR. No JVD. Left chest wall port.  Respiratory: CTA B. Unlabored. GI: Soft. Bowel sounds are normal.  Musc: Edema b/l LE. Right heel cord contracture Neurological. Alert and oriented  Fair awareness of deficits.  Motor: RUE: 4/5 proximal to distal  LUE: 4/5 proximal to distal RLE: 4-/5 HF, 4-/5 KE, 4/5 ADF/PF (slightly improved) LLE: 3+/5 HF, 4-/5 KE, 4/5 ADF/PF (slightly improved) Skin. Chronic lymphedema lower extremities with dressing over both legs.  Psych: pleasant and cooperative  Assessment/Plan: 1. Functional deficits secondary to MS exacerbation which require 3+ hours per day of interdisciplinary therapy in a comprehensive inpatient rehab setting. Physiatrist is providing close team supervision and 24 hour management of active medical problems listed below. Physiatrist and rehab team  continue to assess barriers to discharge/monitor patient progress toward functional and medical goals.  Function:  Bathing Bathing position   Position: Wheelchair/chair at sink  Bathing parts Body parts bathed by patient: Right arm, Left arm, Chest, Abdomen, Right upper leg, Left upper leg, Front perineal area Body parts bathed by helper: Front perineal area, Buttocks, Back  Bathing assist Assist Level: Touching or steadying assistance(Pt > 75%)      Upper Body Dressing/Undressing Upper body dressing   What is the patient wearing?: Pull over shirt/dress     Pull over shirt/dress - Perfomed by patient: Thread/unthread right sleeve, Thread/unthread left sleeve, Put head through opening, Pull shirt over trunk          Upper body assist Assist Level: Supervision or verbal cues      Lower Body Dressing/Undressing Lower body dressing   What is the patient wearing?: Pants, Non-skid slipper socks, Shoes   Underwear - Performed by helper: Thread/unthread right underwear leg, Thread/unthread left underwear leg, Pull underwear up/down (To don briefs d/t incontinence) Pants- Performed by patient: Thread/unthread left pants leg, Pull pants up/down Pants- Performed by helper: Thread/unthread right pants leg, Thread/unthread left pants leg, Pull pants up/down Non-skid slipper socks- Performed by patient: Don/doff right sock, Don/doff left sock Non-skid slipper socks- Performed by helper: Don/doff right sock, Don/doff left sock (due to unaboots applied)       Shoes - Performed by  helper: Don/doff right shoe, Don/doff left shoe, Fasten right, Fasten left          Lower body assist Assist for lower body dressing: Touching or steadying assistance (Pt > 75%)      Toileting Toileting Toileting activity did not occur: Refused Toileting steps completed by patient: Performs perineal hygiene, Adjust clothing prior to toileting Toileting steps completed by helper: Adjust clothing after  toileting Toileting Assistive Devices: Grab bar or rail  Toileting assist Assist level: Supervision or verbal cues   Transfers Chair/bed transfer Chair/bed transfer activity did not occur: Safety/medical concerns Chair/bed transfer method: Ambulatory Chair/bed transfer assist level: Moderate assist (Pt 50 - 74%/lift or lower) Chair/bed transfer assistive device: Armrests, Walker Mechanical lift: Ecologist     Max distance: 15 ft Assist level: Supervision or verbal cues   Wheelchair   Type: Manual Max wheelchair distance: 150 ft Assist Level: Supervision or verbal cues  Cognition Comprehension Comprehension assist level: Follows basic conversation/direction with no assist  Expression Expression assist level: Expresses basic 90% of the time/requires cueing < 10% of the time.  Social Interaction Social Interaction assist level: Interacts appropriately 90% of the time - Needs monitoring or encouragement for participation or interaction.  Problem Solving Problem solving assist level: Solves basic 75 - 89% of the time/requires cueing 10 - 24% of the time  Memory Memory assist level: Recognizes or recalls 75 - 89% of the time/requires cueing 10 - 24% of the time    Medical Problem List and Plan: 1. 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 Continue interferon as directed  D/c SNF today  Will see patient in 1 month for hospital follow up 2. DVT Prophylaxis/Anticoagulation: Subcutaneous Lovenox. Monitor for any bleeding episodes 3. Pain Management/chronic pain:Baclofen 20 mg 3 times a day, Duragesic patch 50 g, oxycodone as needed 4. Mood: Provide emotional support 5. Neuropsych: This patient iscapable of making decisions on herown behalf. 6. Skin/Wound Care: Routine skin care bilateral lower extremities, stage 1 sacral 7. Fluids/Electrolytes/Nutrition: Routine I&Os  BMP within acceptable rage 4/26 8.Neurogenic bladder/DSD.  Completecourse of Keflex.   Pt reports very overactive bladder at home  ContinueTimed voiding, oob to void  Flomax started 4/16 d/ced 4/22  Lasix 20mg  qhs started 4/16, changed to daily, d/ced 4/22  Ditropan 5mg  TID   Ditropan 10mg  qhs started 4/24,  (home dose 20)  Monitor with treatment of UTI  Ucx+ Enterobacter  Macrobid changed to Cipro 4/25-45/1 9.Seizure disorder. Keppra 500 mg twice a day 10.Constipation. 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 (tension PRAFO RLE---needs to be taut---instructed pt/RN)  aggressive ROM with therapy  Will consider Botox as outpt 12. Chronic lymphedema--continue compression wraps. Pt has Unna boots at home  [place] consulted 13. Mild hypoalbuminemia  Cont to monitor 14. ABLA  Hb 11.8 on 4/26  Cont to monitor 15. Thrombocytopenia  Plts 133 on 4/26  Cont to monitor 16. Constipation/Neurogenic bowel  Increased bowel reg 4/13, again 4/16, again 4/19 17. Herpes Zoster  Valcyclovir completed 4/16-4/23 18. Enterobacter UTI  Ucx+ Enterobacter  Macrobid changed to Cipro 4/25-45/1  LOS (Days) 16 A FACE TO FACE EVALUATION WAS PERFORMED  Jayln Madeira Lorie Phenix 06/02/2016 10:23 AM

## 2016-06-03 DIAGNOSIS — G40909 Epilepsy, unspecified, not intractable, without status epilepticus: Secondary | ICD-10-CM | POA: Diagnosis not present

## 2016-06-03 DIAGNOSIS — N3000 Acute cystitis without hematuria: Secondary | ICD-10-CM | POA: Diagnosis not present

## 2016-06-03 DIAGNOSIS — N390 Urinary tract infection, site not specified: Secondary | ICD-10-CM | POA: Diagnosis not present

## 2016-06-03 DIAGNOSIS — G894 Chronic pain syndrome: Secondary | ICD-10-CM | POA: Insufficient documentation

## 2016-06-03 DIAGNOSIS — K5900 Constipation, unspecified: Secondary | ICD-10-CM | POA: Diagnosis not present

## 2016-06-03 DIAGNOSIS — G35 Multiple sclerosis: Secondary | ICD-10-CM | POA: Diagnosis not present

## 2016-06-03 DIAGNOSIS — N319 Neuromuscular dysfunction of bladder, unspecified: Secondary | ICD-10-CM | POA: Diagnosis not present

## 2016-06-13 ENCOUNTER — Ambulatory Visit: Payer: Medicare Other | Admitting: Family Medicine

## 2016-06-17 ENCOUNTER — Ambulatory Visit: Payer: Medicare Other | Admitting: Cardiovascular Disease

## 2016-06-20 DIAGNOSIS — R569 Unspecified convulsions: Secondary | ICD-10-CM | POA: Diagnosis not present

## 2016-06-20 DIAGNOSIS — N3289 Other specified disorders of bladder: Secondary | ICD-10-CM | POA: Diagnosis not present

## 2016-06-20 DIAGNOSIS — K5900 Constipation, unspecified: Secondary | ICD-10-CM | POA: Diagnosis not present

## 2016-06-20 DIAGNOSIS — Z9109 Other allergy status, other than to drugs and biological substances: Secondary | ICD-10-CM | POA: Diagnosis not present

## 2016-06-20 DIAGNOSIS — G35 Multiple sclerosis: Secondary | ICD-10-CM | POA: Diagnosis not present

## 2016-06-21 ENCOUNTER — Telehealth: Payer: Self-pay | Admitting: Family Medicine

## 2016-06-21 NOTE — Telephone Encounter (Signed)
Pt requesting a prescription for pull ups and briefs. Would like to pick it up tomorrow (c) [phone]

## 2016-06-21 NOTE — Telephone Encounter (Signed)
Sure; please get as much information as needed for Rx (number needed per month, diagnosis, size, etc.), put on Rx pad, and I'll be glad to sign Thank you

## 2016-06-22 NOTE — Telephone Encounter (Signed)
Left detailed voicemail, needing more info

## 2016-06-23 NOTE — Telephone Encounter (Signed)
Patient called back states needs 2 separate Rx's one for briefs which she wears a night XL 4 per night enough for 1 month Quantity of 16 Dx incontinence.  The second Rx for pull-ups states wear during the day XL 1-2 per day quantity of 8 per month.

## 2016-06-25 DIAGNOSIS — Z853 Personal history of malignant neoplasm of breast: Secondary | ICD-10-CM | POA: Diagnosis not present

## 2016-06-25 DIAGNOSIS — Z993 Dependence on wheelchair: Secondary | ICD-10-CM | POA: Diagnosis not present

## 2016-06-25 DIAGNOSIS — I890 Lymphedema, not elsewhere classified: Secondary | ICD-10-CM | POA: Diagnosis not present

## 2016-06-25 DIAGNOSIS — Z9181 History of falling: Secondary | ICD-10-CM | POA: Diagnosis not present

## 2016-06-25 DIAGNOSIS — R32 Unspecified urinary incontinence: Secondary | ICD-10-CM | POA: Diagnosis not present

## 2016-06-25 DIAGNOSIS — K589 Irritable bowel syndrome without diarrhea: Secondary | ICD-10-CM | POA: Diagnosis not present

## 2016-06-25 DIAGNOSIS — M4802 Spinal stenosis, cervical region: Secondary | ICD-10-CM | POA: Diagnosis not present

## 2016-06-25 DIAGNOSIS — G40909 Epilepsy, unspecified, not intractable, without status epilepticus: Secondary | ICD-10-CM | POA: Diagnosis not present

## 2016-06-25 DIAGNOSIS — G35 Multiple sclerosis: Secondary | ICD-10-CM | POA: Diagnosis not present

## 2016-06-27 ENCOUNTER — Encounter: Payer: Self-pay | Admitting: Family Medicine

## 2016-06-27 ENCOUNTER — Ambulatory Visit (INDEPENDENT_AMBULATORY_CARE_PROVIDER_SITE_OTHER): Payer: Medicare Other | Admitting: Family Medicine

## 2016-06-27 VITALS — BP 108/64 | HR 73 | Temp 97.8°F | Resp 16

## 2016-06-27 DIAGNOSIS — D696 Thrombocytopenia, unspecified: Secondary | ICD-10-CM

## 2016-06-27 DIAGNOSIS — Z114 Encounter for screening for human immunodeficiency virus [HIV]: Secondary | ICD-10-CM | POA: Diagnosis not present

## 2016-06-27 DIAGNOSIS — G40209 Localization-related (focal) (partial) symptomatic epilepsy and epileptic syndromes with complex partial seizures, not intractable, without status epilepticus: Secondary | ICD-10-CM

## 2016-06-27 DIAGNOSIS — G35 Multiple sclerosis: Secondary | ICD-10-CM

## 2016-06-27 DIAGNOSIS — D709 Neutropenia, unspecified: Secondary | ICD-10-CM | POA: Diagnosis not present

## 2016-06-27 DIAGNOSIS — Z1159 Encounter for screening for other viral diseases: Secondary | ICD-10-CM | POA: Diagnosis not present

## 2016-06-27 DIAGNOSIS — N390 Urinary tract infection, site not specified: Secondary | ICD-10-CM

## 2016-06-27 NOTE — Progress Notes (Signed)
BP 108/64 (BP Location: Left Arm, Patient Position: Sitting, Cuff Size: Normal)   Pulse 73   Temp 97.8 F (36.6 C) (Oral)   Resp 16   SpO2 97%    Subjective:    Patient ID: Rebecca [name], female    DOB: 1960/08/30, 56 y.o.   MRN: [id number]  HPI: Rebecca [name] is a 56 y.o. female  Chief Complaint  Patient presents with  . Hospitalization Follow-up    severe UTI   HPI Since last visit, she has been hospitalized; unable to walk even a foot; could not take a step Started with a severe UTI; no burning, no odor She was discharge from the hospital [facility]) and then went to [facility] They are going to be sending OT and PT to the house We reviewed her medicine list Taking ditropan 5 mg BID Last dose of cipro 250 mg BID was at [employer] bowels adequately off of Miralax  White blood cell dropped in the hospital; no boils; did have skin breakdown, and they put cream on it; all healed up she says Last WBC 3.1k Last seizure was over a year or more; wishes to stay on [place] for now  Depression screen [facility] 2/9 06/27/2016 11/25/2015 08/25/2015 10/24/2014  Decreased Interest 0 0 0 0  Down, Depressed, Hopeless 0 0 0 0  PHQ - 2 Score 0 0 0 0  Some recent data might be hidden    Relevant past medical, surgical, family and social history reviewed Past Medical History:  Diagnosis Date  . Aortic valve disease    Mild AS / AI - most recent Echo demonstrated tricuspid aortic valve.  . Bacterial endocarditis    History of .  Marland Kitchen Bilateral lower extremity edema    Noncardiac.  Chronic. LE Venous dopplers - negative for DVT.; Echocardiogram January 2016: Normal EF with normal wall motion and valve function. Only grade 1 diastolic dysfunction. EF 60-65%. Mild MR  . Breast cancer ([place]) 12-31-13   Right breast, 12:00, 1.5 cm, T1c,N0 invasive mammary carcinoma, triple negative. --> Rx with Chemo  . Cervical stenosis of spine   . Herpes zoster   . IBS (irritable bowel  syndrome)   . Lymphedema    has legs wrapped at [facility]  . Multiple sclerosis ([place]) 2001   Walks from room to room @ home; but Wheelchair when going out.  Marland Kitchen Neuromuscular disorder ([place])    MS  . Seizures ([place])    Takes Keppra  . Syncope and collapse    Past Surgical History:  Procedure Laterality Date  . ANKLE SURGERY     Left  . ANKLE SURGERY    . ANTERIOR CERVICAL DECOMP/DISCECTOMY FUSION  11/17/2011   Procedure: ANTERIOR CERVICAL DECOMPRESSION/DISCECTOMY FUSION 2 LEVELS;  Surgeon: Erline Levine, MD;  Location: [place] [facility]UR[facility] OR[facility];  Service: Neurosurgery;  Laterality: N/A;  Cervical Five-Six Six-Seven Anterior cervical decompression/diskectomy/fusion  . BREAST BIOPSY Right 12-31-13   invasive mammary  . BREAST SURGERY Right 02/03/2014   Right simple mastectomy with sentinel node biopsy.  . CHOLECYSTECTOMY    . COLONOSCOPY  2014  . Lower extremity venous Dopplers  Feb 27, 2013   No LE DVT  . MASTECTOMY    . Port a cath insertion Right 01/19/2010  . PORT-A-CATH REMOVAL     right  . PORT-A-CATH REMOVAL Right 09/03/2013   Procedure: REMOVAL PORT-A-CATH;  Surgeon: Conrad Briar, MD;  Location: [place];  Service: Vascular;  Laterality: Right;  .  TRANSTHORACIC ECHOCARDIOGRAM  03/2013; 02/2014   a) Normal LV size and function with EF 60-65%.; Cannot exclude bicuspid aortic valve with mild AS and mild AI.; b) Normal EF with normal wall motion and valve function x Mild MR. G2 DD. EF 60-65%. Tricuspid AoV  . UPPER GI ENDOSCOPY  2014   Family History  Problem Relation Age of Onset  . Cancer Father        skin  . Heart disease Father   . Heart attack Father        heart attack in his 49's  . Thyroid disease Sister   . Ovarian cancer Cousin   . Breast cancer Maternal Aunt 60  . Breast cancer Maternal Grandmother 74   Social History   Social History  . Marital status: Married    Spouse name: don  . Number of children: 0  . Years of education: 12   Occupational History  . disability     Social History Main Topics  . Smoking status: Never Smoker  . Smokeless tobacco: Never Used  . Alcohol use No  . Drug use: No  . Sexual activity: Not Currently    Partners: Male   Other Topics Concern  . Not on file   Social History Narrative   She is married. Recently moved back to [place] after being in [place] for some time. She is accompanied by her husband and aunt.   Never smoked. Never used alcohol.    Interim medical history since last visit reviewed. Allergies and medications reviewed  Review of Systems Per HPI unless specifically indicated above     Objective:    BP 108/64 (BP Location: Left Arm, Patient Position: Sitting, Cuff Size: Normal)   Pulse 73   Temp 97.8 F (36.6 C) (Oral)   Resp 16   SpO2 97%   Wt Readings from Last 3 Encounters:  05/20/16 193 lb (87.5 kg)  05/14/16 175 lb (79.4 kg)  11/25/15 170 lb (77.1 kg)   MD note: staff did not get a weight today  Physical Exam  Constitutional: She appears well-developed and well-nourished. No distress.  Seated in wheelchair; gait not assessed  Eyes: EOM are normal. No scleral icterus.  Neck: No JVD present. No thyromegaly present.  Cardiovascular: Normal rate.   Pulmonary/Chest: Effort normal.  Abdominal: Soft. She exhibits no distension.  Musculoskeletal: She exhibits edema (bilateral leg edema, nonpitting).  Neurological: She is alert. She displays no tremor.  Skin: No pallor.  Psychiatric: She has a normal mood and affect. Her behavior is normal. Judgment and thought content normal.    Results for orders placed or performed in visit on 06/27/16  HIV antibody (with reflex)  Result Value Ref Range   HIV Screen 4th Generation wRfx Non Reactive Non Reactive  Hepatitis C Antibody  Result Value Ref Range   Hep C Virus Ab <0.1 0.0 - 0.9 s/co ratio  CBC with Differential/Platelet  Result Value Ref Range   WBC 4.7 3.4 - 10.8 x10E3/uL   RBC 4.14 3.77 - 5.28 x10E6/uL   Hemoglobin 12.4  11.1 - 15.9 g/dL   Hematocrit 37.9 34.0 - 46.6 %   MCV 92 79 - 97 fL   MCH 30.0 26.6 - 33.0 pg   MCHC 32.7 31.5 - 35.7 g/dL   RDW 13.2 12.3 - 15.4 %   Platelets 163 150 - 379 x10E3/uL   Neutrophils 70 Not Estab. %   Lymphs 21 Not Estab. %   Monocytes 7 Not Estab. %  Eos 2 Not Estab. %   Basos 0 Not Estab. %   Neutrophils Absolute 3.3 1.4 - 7.0 x10E3/uL   Lymphocytes Absolute 1.0 0.7 - 3.1 x10E3/uL   Monocytes Absolute 0.3 0.1 - 0.9 x10E3/uL   EOS (ABSOLUTE) 0.1 0.0 - 0.4 x10E3/uL   Basophils Absolute 0.0 0.0 - 0.2 x10E3/uL   Immature Granulocytes 0 Not Estab. %   Immature Grans (Abs) 0.0 0.0 - 0.1 x10E3/uL      Assessment & Plan:   Problem List Items Addressed This Visit      Nervous and Auditory   Multiple sclerosis exacerbation ([place])    Flare recently requiring hospitalization and recent rehab; will plan for home OT/PT      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HCC)    Last seizure 1+ year ago        Genitourinary   Acute lower UTI - Primary    Check urine today      Relevant Orders   Urinalysis, Routine w reflex microscopic     Other   Thrombocytopenia (HCC)    Check CBC      Relevant Orders   CBC with Differential/Platelet (Completed)   Neutropenia (HCC)    Check CBC      Relevant Orders   CBC with Differential/Platelet (Completed)    Other Visit Diagnoses    Screening for HIV (human immunodeficiency virus)       Relevant Orders   HIV antibody (with reflex) (Completed)   Need for hepatitis C screening test       Relevant Orders   Hepatitis C Antibody (Completed)       Follow up plan: Return in about 4 months (around 10/28/2016) for follow-up visit with Dr. Sanda Klein.  An after-visit summary was printed and given to the patient at [facility].  Please see the patient instructions which may contain other information and recommendations beyond what is mentioned above in the assessment and plan.  Meds ordered this encounter  Medications  . ibuprofen  (ADVIL,MOTRIN) 600 MG tablet    Sig: TAKE 1 TABLET (600 MG TOTAL) BY MOUTH EVERY 6 (SIX) HOURS AS NEEDED.    Refill:  2  . prochlorperazine (COMPAZINE) 10 MG tablet    Sig: TAKE 1 TABLET (10 MG TOTAL) BY MOUTH EVERY 6 (SIX) HOURS AS NEEDED.    Refill:  5  . glucosamine-chondroitin 500-400 MG tablet    Sig: Take 1 tablet by mouth 4 (four) times daily.    Orders Placed This Encounter  Procedures  . HIV antibody (with reflex)  . Hepatitis C Antibody  . CBC with Differential/Platelet  . Urinalysis, Routine w reflex microscopic

## 2016-06-27 NOTE — Assessment & Plan Note (Signed)
Flare recently requiring hospitalization and recent rehab; will plan for home OT/PT

## 2016-06-27 NOTE — Assessment & Plan Note (Signed)
Last seizure 1+ year ago

## 2016-06-27 NOTE — Assessment & Plan Note (Signed)
Check CBC 

## 2016-06-27 NOTE — Patient Instructions (Signed)
Please do submit a urine specimen We'll get labs today (bloodwork) Let me know how I may be of service

## 2016-06-27 NOTE — Assessment & Plan Note (Signed)
Check urine today 

## 2016-06-28 ENCOUNTER — Ambulatory Visit: Payer: Medicare Other | Admitting: Cardiovascular Disease

## 2016-06-28 ENCOUNTER — Telehealth: Payer: Self-pay

## 2016-06-28 DIAGNOSIS — G40909 Epilepsy, unspecified, not intractable, without status epilepticus: Secondary | ICD-10-CM | POA: Diagnosis not present

## 2016-06-28 DIAGNOSIS — R32 Unspecified urinary incontinence: Secondary | ICD-10-CM | POA: Diagnosis not present

## 2016-06-28 DIAGNOSIS — K589 Irritable bowel syndrome without diarrhea: Secondary | ICD-10-CM | POA: Diagnosis not present

## 2016-06-28 DIAGNOSIS — I890 Lymphedema, not elsewhere classified: Secondary | ICD-10-CM | POA: Diagnosis not present

## 2016-06-28 DIAGNOSIS — G35 Multiple sclerosis: Secondary | ICD-10-CM | POA: Diagnosis not present

## 2016-06-28 DIAGNOSIS — M4802 Spinal stenosis, cervical region: Secondary | ICD-10-CM | POA: Diagnosis not present

## 2016-06-28 LAB — CBC WITH DIFFERENTIAL/PLATELET
BASOS: 0 %
Basophils Absolute: 0 10*3/uL (ref 0.0–0.2)
EOS (ABSOLUTE): 0.1 10*3/uL (ref 0.0–0.4)
EOS: 2 %
HEMOGLOBIN: 12.4 g/dL (ref 11.1–15.9)
Hematocrit: 37.9 % (ref 34.0–46.6)
Immature Grans (Abs): 0 10*3/uL (ref 0.0–0.1)
Immature Granulocytes: 0 %
Lymphocytes Absolute: 1 10*3/uL (ref 0.7–3.1)
Lymphs: 21 %
MCH: 30 pg (ref 26.6–33.0)
MCHC: 32.7 g/dL (ref 31.5–35.7)
MCV: 92 fL (ref 79–97)
MONOCYTES: 7 %
Monocytes Absolute: 0.3 10*3/uL (ref 0.1–0.9)
NEUTROS ABS: 3.3 10*3/uL (ref 1.4–7.0)
Neutrophils: 70 %
Platelets: 163 10*3/uL (ref 150–379)
RBC: 4.14 x10E6/uL (ref 3.77–5.28)
RDW: 13.2 % (ref 12.3–15.4)
WBC: 4.7 10*3/uL (ref 3.4–10.8)

## 2016-06-28 LAB — HIV ANTIBODY (ROUTINE TESTING W REFLEX): HIV Screen 4th Generation wRfx: NONREACTIVE

## 2016-06-28 LAB — HEPATITIS C ANTIBODY: Hep C Virus Ab: <0.1 s/co ratio (ref 0.0–0.9)

## 2016-06-28 NOTE — Telephone Encounter (Signed)
Rebecca [name], PT form [facility] is asking for a verbal for pt to have physical therapy once a week for four weeks. If you have any question call back # is  [phone].

## 2016-06-28 NOTE — Telephone Encounter (Signed)
That is fine thank you .  

## 2016-06-29 DIAGNOSIS — G35 Multiple sclerosis: Secondary | ICD-10-CM | POA: Diagnosis not present

## 2016-06-29 DIAGNOSIS — R32 Unspecified urinary incontinence: Secondary | ICD-10-CM | POA: Diagnosis not present

## 2016-06-29 DIAGNOSIS — K589 Irritable bowel syndrome without diarrhea: Secondary | ICD-10-CM | POA: Diagnosis not present

## 2016-06-29 DIAGNOSIS — I890 Lymphedema, not elsewhere classified: Secondary | ICD-10-CM | POA: Diagnosis not present

## 2016-06-29 DIAGNOSIS — M4802 Spinal stenosis, cervical region: Secondary | ICD-10-CM | POA: Diagnosis not present

## 2016-06-29 DIAGNOSIS — G40909 Epilepsy, unspecified, not intractable, without status epilepticus: Secondary | ICD-10-CM | POA: Diagnosis not present

## 2016-06-29 NOTE — Telephone Encounter (Signed)
Called Rebecca [name], Pt and left him a voicemail to give me a call back.

## 2016-06-30 ENCOUNTER — Telehealth: Payer: Self-pay

## 2016-06-30 ENCOUNTER — Encounter: Payer: Medicare Other | Admitting: Physical Medicine & Rehabilitation

## 2016-06-30 NOTE — Telephone Encounter (Signed)
I was not aware she had a problem, but that is certainly fine with me Thank you

## 2016-06-30 NOTE — Telephone Encounter (Signed)
Rebecca [name] states the pt would like to have a unna boot put back on.  She just need a verbal from you and she can get it order for her. If you have any question her # is [phone]

## 2016-06-30 NOTE — Telephone Encounter (Signed)
Robin has been informed.

## 2016-06-30 NOTE — Telephone Encounter (Signed)
No additional notes

## 2016-07-05 DIAGNOSIS — R32 Unspecified urinary incontinence: Secondary | ICD-10-CM | POA: Diagnosis not present

## 2016-07-05 DIAGNOSIS — M4802 Spinal stenosis, cervical region: Secondary | ICD-10-CM | POA: Diagnosis not present

## 2016-07-05 DIAGNOSIS — G40909 Epilepsy, unspecified, not intractable, without status epilepticus: Secondary | ICD-10-CM | POA: Diagnosis not present

## 2016-07-05 DIAGNOSIS — I890 Lymphedema, not elsewhere classified: Secondary | ICD-10-CM | POA: Diagnosis not present

## 2016-07-05 DIAGNOSIS — G35 Multiple sclerosis: Secondary | ICD-10-CM | POA: Diagnosis not present

## 2016-07-05 DIAGNOSIS — K589 Irritable bowel syndrome without diarrhea: Secondary | ICD-10-CM | POA: Diagnosis not present

## 2016-07-06 DIAGNOSIS — M4802 Spinal stenosis, cervical region: Secondary | ICD-10-CM | POA: Diagnosis not present

## 2016-07-06 DIAGNOSIS — R32 Unspecified urinary incontinence: Secondary | ICD-10-CM | POA: Diagnosis not present

## 2016-07-06 DIAGNOSIS — I890 Lymphedema, not elsewhere classified: Secondary | ICD-10-CM | POA: Diagnosis not present

## 2016-07-06 DIAGNOSIS — G35 Multiple sclerosis: Secondary | ICD-10-CM | POA: Diagnosis not present

## 2016-07-06 DIAGNOSIS — K589 Irritable bowel syndrome without diarrhea: Secondary | ICD-10-CM | POA: Diagnosis not present

## 2016-07-06 DIAGNOSIS — G40909 Epilepsy, unspecified, not intractable, without status epilepticus: Secondary | ICD-10-CM | POA: Diagnosis not present

## 2016-07-07 DIAGNOSIS — G40909 Epilepsy, unspecified, not intractable, without status epilepticus: Secondary | ICD-10-CM | POA: Diagnosis not present

## 2016-07-07 DIAGNOSIS — I890 Lymphedema, not elsewhere classified: Secondary | ICD-10-CM | POA: Diagnosis not present

## 2016-07-07 DIAGNOSIS — M4802 Spinal stenosis, cervical region: Secondary | ICD-10-CM | POA: Diagnosis not present

## 2016-07-07 DIAGNOSIS — G35 Multiple sclerosis: Secondary | ICD-10-CM | POA: Diagnosis not present

## 2016-07-07 DIAGNOSIS — K589 Irritable bowel syndrome without diarrhea: Secondary | ICD-10-CM | POA: Diagnosis not present

## 2016-07-07 DIAGNOSIS — R32 Unspecified urinary incontinence: Secondary | ICD-10-CM | POA: Diagnosis not present

## 2016-07-08 ENCOUNTER — Other Ambulatory Visit: Payer: Self-pay | Admitting: Diagnostic Neuroimaging

## 2016-07-08 ENCOUNTER — Other Ambulatory Visit: Payer: Self-pay | Admitting: *Deleted

## 2016-07-08 NOTE — Addendum Note (Signed)
Addended by: Betti Cruz on: 07/08/2016 04:11 PM   Modules accepted: Orders

## 2016-07-08 NOTE — Telephone Encounter (Addendum)
I spoke with Rebecca [name] and told her I looked at her [facility] and Dr Grayland Ormond has prior to hospitalization been filling her fentanyl and oxycodone.  Her last fill of both was 05/02/16.  When she was discharged from hospital Dan PA gave her rx for small amt of both but I do not see the prescriptions as being filled.  She then revealed she has been at [facility] and they have been giving her medications and they discharged her last week.  She reports she is going to get medications from Dr Grayland Ormond on Monday.

## 2016-07-08 NOTE — Telephone Encounter (Signed)
I spoke with Rebecca [name] at Dr Serita Grit office where the patient is scheduled for FU from recent hospitalization and was given refills of her narcotics by them at discharge. He asked that we not fill those meds for her at his time, Patient has an appt for FU with them on Friday and they will check on refills at that time. He states he will send a note to Dr Posey Pronto that she is calling us to refill her pain meds and let me know what he says

## 2016-07-08 NOTE — Telephone Encounter (Signed)
Hassan Rowan RN Triage at the [facility] called [phone], states that the patient called to them requesting refill on her discharge medications for fentanyl patches 27mcg and Oxycodone 5mg , patient was discharged from [facility] on 4/26 with a hospital follow up on 6/8  Please advise

## 2016-07-11 ENCOUNTER — Telehealth: Payer: Self-pay | Admitting: *Deleted

## 2016-07-11 MED ORDER — OXYCODONE HCL 5 MG PO TABS: 5.0000 mg | ORAL_TABLET | Freq: Four times a day (QID) | ORAL | 0 refills | Status: DC | PRN

## 2016-07-11 MED ORDER — FENTANYL 50 MCG/HR TD PT72: 50.0000 ug | MEDICATED_PATCH | TRANSDERMAL | 0 refills | Status: DC

## 2016-07-11 NOTE — Telephone Encounter (Signed)
Reprinted on RX paper

## 2016-07-12 DIAGNOSIS — G35 Multiple sclerosis: Secondary | ICD-10-CM | POA: Diagnosis not present

## 2016-07-12 DIAGNOSIS — K589 Irritable bowel syndrome without diarrhea: Secondary | ICD-10-CM | POA: Diagnosis not present

## 2016-07-12 DIAGNOSIS — R32 Unspecified urinary incontinence: Secondary | ICD-10-CM | POA: Diagnosis not present

## 2016-07-12 DIAGNOSIS — G40909 Epilepsy, unspecified, not intractable, without status epilepticus: Secondary | ICD-10-CM | POA: Diagnosis not present

## 2016-07-12 DIAGNOSIS — M4802 Spinal stenosis, cervical region: Secondary | ICD-10-CM | POA: Diagnosis not present

## 2016-07-12 DIAGNOSIS — I890 Lymphedema, not elsewhere classified: Secondary | ICD-10-CM | POA: Diagnosis not present

## 2016-07-13 ENCOUNTER — Telehealth: Payer: Self-pay | Admitting: *Deleted

## 2016-07-13 DIAGNOSIS — M25562 Pain in left knee: Principal | ICD-10-CM

## 2016-07-13 DIAGNOSIS — M25561 Pain in right knee: Secondary | ICD-10-CM

## 2016-07-13 NOTE — Telephone Encounter (Signed)
Yes, pain clinic referral please.

## 2016-07-13 NOTE — Telephone Encounter (Signed)
Referral entered into EPIC, patient informed of dose and qty remaining same and that he is referring her to pain clinic. She was ok with this plan. I asked her to call me back if she doe snot hear from pain clinic by next week.

## 2016-07-13 NOTE — Telephone Encounter (Signed)
She was asking why we cut her Oxycodone down to 5 mg and why we cut the number dispensed from 120 to 60. I explained to her that her last Rx written when she was discharged was for the 5 mg tabs and that 60 tabs are a 2 week supply. I also explained to her that there was confusion as to who was going to write for her pain meds as I had spoken with Bruce at [facility] [place] office who said she had an appt with them coming up and they would discuss her meds at that appt, then got a message that she was going to get them filled here. She insists that she is still taking 10 mg and that she was discharged form nursing home with 10 mg, but does not know who wrote for them. I asked her about weaning off of some of her pain meds since she is no longer getting treatment and she states that her legs hurt and start twitching and so she needs the Oxycodone. Do you want to refer her to pain clinic? Pease advise.

## 2016-07-15 ENCOUNTER — Encounter: Payer: Self-pay | Admitting: Physical Medicine & Rehabilitation

## 2016-07-15 ENCOUNTER — Encounter: Payer: Medicare Other | Attending: Physical Medicine & Rehabilitation | Admitting: Physical Medicine & Rehabilitation

## 2016-07-15 VITALS — BP 122/77 | HR 65

## 2016-07-15 DIAGNOSIS — F4323 Adjustment disorder with mixed anxiety and depressed mood: Secondary | ICD-10-CM | POA: Diagnosis not present

## 2016-07-15 DIAGNOSIS — K592 Neurogenic bowel, not elsewhere classified: Secondary | ICD-10-CM

## 2016-07-15 DIAGNOSIS — N3644 Muscular disorders of urethra: Secondary | ICD-10-CM

## 2016-07-15 DIAGNOSIS — G801 Spastic diplegic cerebral palsy: Secondary | ICD-10-CM | POA: Diagnosis not present

## 2016-07-15 DIAGNOSIS — M4802 Spinal stenosis, cervical region: Secondary | ICD-10-CM | POA: Insufficient documentation

## 2016-07-15 DIAGNOSIS — G40909 Epilepsy, unspecified, not intractable, without status epilepticus: Secondary | ICD-10-CM | POA: Diagnosis not present

## 2016-07-15 DIAGNOSIS — I890 Lymphedema, not elsewhere classified: Secondary | ICD-10-CM | POA: Diagnosis not present

## 2016-07-15 DIAGNOSIS — K589 Irritable bowel syndrome without diarrhea: Secondary | ICD-10-CM | POA: Diagnosis not present

## 2016-07-15 DIAGNOSIS — N319 Neuromuscular dysfunction of bladder, unspecified: Secondary | ICD-10-CM | POA: Diagnosis not present

## 2016-07-15 DIAGNOSIS — Z981 Arthrodesis status: Secondary | ICD-10-CM | POA: Insufficient documentation

## 2016-07-15 DIAGNOSIS — Z853 Personal history of malignant neoplasm of breast: Secondary | ICD-10-CM | POA: Insufficient documentation

## 2016-07-15 DIAGNOSIS — G8929 Other chronic pain: Secondary | ICD-10-CM | POA: Insufficient documentation

## 2016-07-15 DIAGNOSIS — G35 Multiple sclerosis: Secondary | ICD-10-CM

## 2016-07-15 NOTE — Progress Notes (Signed)
Subjective:    Patient ID: Rebecca [name], female    DOB: Nov 10, 1960, 56 y.o.   MRN: [id number]  HPI 56 year old right-handed female, history of chronic pain, lymphedema bilateral lower extremities, right breast cancer, chemotherapy, seizure disorder, multiple sclerosis presents for hospital follow up after receiving CIR for exacerbation.   DATE OF ADMISSION:  05/17/2016 DATE OF DISCHARGE:06/02/2016  Husband present, who provides much of history. At discharge, she was instructed to follow up with PCP, whom she has seen.  She continues to wear lymphedema dressing. She was discharged to SNF, but is now at home. She states her pain is controlled, prescribed by Neurology and PCP.  Her bladder symptoms have improved.  Her bowel movements have also improved. She states PCP drew labs.   DME: Already has Therapies: 1/week Mobility: Walker at home, wheelchair in community.   Pain Inventory Average Pain 9 Pain Right Now 0 My pain is intermittent, sharp, burning, tingling and aching  In the last 24 hours, has pain interfered with the following? General activity 0 Relation with others 0 Enjoyment of life 0 What TIME of day is your pain at its worst? evening, night Sleep (in general) Good  Pain is worse with: sitting and inactivity Pain improves with: medication Relief from Meds: 10  Mobility walk with assistance use a walker how many minutes can you walk? 5 ability to climb steps?  no do you drive?  no Do you have any goals in this area?  yes  Function not employed: date last employed . disabled: date disabled . I need assistance with the following:  household duties and shopping Do you have any goals in this area?  no  Neuro/Psych bladder control problems weakness numbness tingling trouble walking spasms dizziness  Prior Studies hospital f/u  Physicians involved in your care hospital f/u   Family History  Problem Relation Age of Onset  . Cancer Father    skin  . Heart disease Father   . Heart attack Father        heart attack in his 62's  . Thyroid disease Sister   . Ovarian cancer Cousin   . Breast cancer Maternal Aunt 60  . Breast cancer Maternal Grandmother 74   Social History   Social History  . Marital status: Married    Spouse name: don  . Number of children: 0  . Years of education: 12   Occupational History  . disability    Social History Main Topics  . Smoking status: Never Smoker  . Smokeless tobacco: Never Used  . Alcohol use No  . Drug use: No  . Sexual activity: Not Currently    Partners: Male   Other Topics Concern  . None   Social History Narrative   She is married. Recently moved back to [place] after being in [place] for some time. She is accompanied by her husband and aunt.   Never smoked. Never used alcohol.   Past Surgical History:  Procedure Laterality Date  . ANKLE SURGERY     Left  . ANKLE SURGERY    . ANTERIOR CERVICAL DECOMP/DISCECTOMY FUSION  11/17/2011   Procedure: ANTERIOR CERVICAL DECOMPRESSION/DISCECTOMY FUSION 2 LEVELS;  Surgeon: Erline Levine, MD;  Location: [place] [facility];  Service: Neurosurgery;  Laterality: N/A;  Cervical Five-Six Six-Seven Anterior cervical decompression/diskectomy/fusion  . BREAST BIOPSY Right 12-31-13   invasive mammary  . BREAST SURGERY Right 02/03/2014   Right simple mastectomy with sentinel node biopsy.  . CHOLECYSTECTOMY    .  COLONOSCOPY  2014  . Lower extremity venous Dopplers  Feb 27, 2013   No LE DVT  . MASTECTOMY    . Port a cath insertion Right 01/19/2010  . PORT-A-CATH REMOVAL     right  . PORT-A-CATH REMOVAL Right 09/03/2013   Procedure: REMOVAL PORT-A-CATH;  Surgeon: Conrad [place], MD;  Location: [facility];  Service: Vascular;  Laterality: Right;  . TRANSTHORACIC ECHOCARDIOGRAM  03/2013; 02/2014   a) Normal LV size and function with EF 60-65%.; Cannot exclude bicuspid aortic valve with mild AS and mild AI.; b) Normal EF with normal wall motion  and valve function x Mild MR. G2 DD. EF 60-65%. Tricuspid AoV  . UPPER GI ENDOSCOPY  2014   Past Medical History:  Diagnosis Date  . Aortic valve disease    Mild AS / AI - most recent Echo demonstrated tricuspid aortic valve.  . Bacterial endocarditis    History of .  Marland Kitchen Bilateral lower extremity edema    Noncardiac.  Chronic. LE Venous dopplers - negative for DVT.; Echocardiogram January 2016: Normal EF with normal wall motion and valve function. Only grade 1 diastolic dysfunction. EF 60-65%. Mild MR  . Breast cancer ([place]) 12-31-13   Right breast, 12:00, 1.5 cm, T1c,N0 invasive mammary carcinoma, triple negative. --> Rx with Chemo  . Cervical stenosis of spine   . Herpes zoster   . IBS (irritable bowel syndrome)   . Lymphedema    has legs wrapped at [facility]  . Multiple sclerosis ([place]) 2001   Walks from room to room @ home; but Wheelchair when going out.  Marland Kitchen Neuromuscular disorder ([place])    MS  . Seizures ([place])    Takes Keppra  . Syncope and collapse    BP 122/77 (BP Location: Left Arm, Patient Position: Sitting, Cuff Size: Large)   Pulse 65   SpO2 98%   Opioid Risk Score:   Fall Risk Score:  `1  Depression screen PHQ 2/9  Depression screen [facility] 2/9 07/15/2016 06/27/2016 11/25/2015 08/25/2015 10/24/2014  Decreased Interest 2 0 0 0 0  Down, Depressed, Hopeless 0 0 0 0 0  PHQ - 2 Score 2 0 0 0 0  Altered sleeping 0 - - - -  Tired, decreased energy 0 - - - -  Change in appetite 0 - - - -  Feeling bad or failure about yourself  0 - - - -  Trouble concentrating 0 - - - -  Moving slowly or fidgety/restless 0 - - - -  Suicidal thoughts 0 - - - -  PHQ-9 Score 2 - - - -  Difficult doing work/chores Somewhat difficult - - - -  Some recent data might be hidden    Review of Systems  HENT: Negative.   Eyes: Negative.   Respiratory: Negative.   Cardiovascular: Negative.   Gastrointestinal: Negative.   Endocrine: Negative.   Genitourinary: Positive for difficulty urinating.    Musculoskeletal: Positive for arthralgias and gait problem.       Spasms  Allergic/Immunologic: Negative.   Neurological: Positive for dizziness, weakness and numbness.       Tingling  Hematological: Negative.   Psychiatric/Behavioral: Negative.   All other systems reviewed and are negative.      Objective:   Physical Exam Gen: Well developed. NAD. Vital signs reviewed. Head: Normocephalic. Atraumatic. Eyes: EOMI. No discharge Cardiovascular: RRR. No JVD.  Respiratory: CTA B. Unlabored. GI: Soft. Bowel sounds are normal.  Musc: Edema b/l LE. Right heel cord contracture  Neurological. Alert and oriented  Fair awareness of deficits.  Motor: RUE: 4/5 proximal to distal  LUE: 4/5 proximal to distal RLE: 3-/5 HF, 4-/5 KE, 2/5 ADF/PF  LLE: 3/5 HF, 4-/5 KE, 4-/5 ADF/PF  MAS: B/l lE 1/4 Skin. Chronic lymphedema lower extremities with dressing over both legs.  Psych: pleasant and cooperative    Assessment & Plan:  56 year old right-handed female, history of chronic pain, lymphedema bilateral lower extremities, right breast cancer, chemotherapy, seizure disorder, multiple sclerosis presents for hospital follow up after receiving CIR for exacerbation.   1. MS   Cont meds   Cont follow up with Neurology  2. Pain Management/chronic pai  Cont meds per Neurology and PCP  3. Neurogenic bladder/DSD.   Cont meds  4. Seizure disorder  Cont meds. Keppra 500 mg twice a day  5.  Spastic diplegia:   Cont meds  Relatively controlled at present  Cont ROM  Will consider Botox injection in the future if appropriate  6. Chronic lymphedema  Cont wraps  Cont therapy  Meds reviewed Referrals reviewed All questions answered

## 2016-07-18 ENCOUNTER — Telehealth: Payer: Self-pay | Admitting: Family Medicine

## 2016-07-18 DIAGNOSIS — R32 Unspecified urinary incontinence: Secondary | ICD-10-CM | POA: Diagnosis not present

## 2016-07-18 DIAGNOSIS — I890 Lymphedema, not elsewhere classified: Secondary | ICD-10-CM | POA: Diagnosis not present

## 2016-07-18 DIAGNOSIS — M4802 Spinal stenosis, cervical region: Secondary | ICD-10-CM | POA: Diagnosis not present

## 2016-07-18 DIAGNOSIS — G40909 Epilepsy, unspecified, not intractable, without status epilepticus: Secondary | ICD-10-CM | POA: Diagnosis not present

## 2016-07-18 DIAGNOSIS — G35 Multiple sclerosis: Secondary | ICD-10-CM | POA: Diagnosis not present

## 2016-07-18 DIAGNOSIS — K589 Irritable bowel syndrome without diarrhea: Secondary | ICD-10-CM | POA: Diagnosis not present

## 2016-07-18 NOTE — Telephone Encounter (Signed)
That's fine; I'll approve; please let her know; thank you

## 2016-07-18 NOTE — Telephone Encounter (Signed)
[facility] WITH [facility] [phone])  IS NEEDING ORDERS TO CONTINUE PT FOR GAIT TRAINING AND STRENTH FOR 2 TIMES A WEEK FOR 4 WKS.

## 2016-07-19 NOTE — Telephone Encounter (Signed)
Left a detail messaged  

## 2016-07-20 ENCOUNTER — Ambulatory Visit
Admission: RE | Admit: 2016-07-20 | Discharge: 2016-07-20 | Disposition: A | Discharge status: Home / Self Care | Payer: Medicare Other | Source: Ambulatory Visit | Attending: Oncology | Admitting: Oncology

## 2016-07-20 DIAGNOSIS — Z9011 Acquired absence of right breast and nipple: Secondary | ICD-10-CM | POA: Diagnosis not present

## 2016-07-20 DIAGNOSIS — Z853 Personal history of malignant neoplasm of breast: Secondary | ICD-10-CM | POA: Insufficient documentation

## 2016-07-20 DIAGNOSIS — Z1231 Encounter for screening mammogram for malignant neoplasm of breast: Secondary | ICD-10-CM | POA: Diagnosis not present

## 2016-07-20 DIAGNOSIS — C50911 Malignant neoplasm of unspecified site of right female breast: Secondary | ICD-10-CM

## 2016-07-21 ENCOUNTER — Other Ambulatory Visit: Payer: Self-pay | Admitting: Diagnostic Neuroimaging

## 2016-07-21 DIAGNOSIS — R32 Unspecified urinary incontinence: Secondary | ICD-10-CM | POA: Diagnosis not present

## 2016-07-21 DIAGNOSIS — G40909 Epilepsy, unspecified, not intractable, without status epilepticus: Secondary | ICD-10-CM | POA: Diagnosis not present

## 2016-07-21 DIAGNOSIS — I890 Lymphedema, not elsewhere classified: Secondary | ICD-10-CM | POA: Diagnosis not present

## 2016-07-21 DIAGNOSIS — K589 Irritable bowel syndrome without diarrhea: Secondary | ICD-10-CM | POA: Diagnosis not present

## 2016-07-21 DIAGNOSIS — G35 Multiple sclerosis: Secondary | ICD-10-CM | POA: Diagnosis not present

## 2016-07-21 DIAGNOSIS — M4802 Spinal stenosis, cervical region: Secondary | ICD-10-CM | POA: Diagnosis not present

## 2016-07-21 NOTE — Telephone Encounter (Signed)
Spoke with patient to clarify the dose of Oxybutynin she is taking. She stated she is taking Oxybutynin 10 mg tablets, two tablets daily. Confirmed pharmacy [facility] [address] and advised the refill will be sent. She verbalized understanding of call.

## 2016-07-22 ENCOUNTER — Telehealth: Payer: Self-pay | Admitting: Family Medicine

## 2016-07-22 DIAGNOSIS — G35 Multiple sclerosis: Secondary | ICD-10-CM | POA: Diagnosis not present

## 2016-07-22 DIAGNOSIS — R32 Unspecified urinary incontinence: Secondary | ICD-10-CM | POA: Diagnosis not present

## 2016-07-22 DIAGNOSIS — K589 Irritable bowel syndrome without diarrhea: Secondary | ICD-10-CM | POA: Diagnosis not present

## 2016-07-22 DIAGNOSIS — N39490 Overflow incontinence: Secondary | ICD-10-CM

## 2016-07-22 DIAGNOSIS — G40909 Epilepsy, unspecified, not intractable, without status epilepticus: Secondary | ICD-10-CM | POA: Diagnosis not present

## 2016-07-22 DIAGNOSIS — N319 Neuromuscular dysfunction of bladder, unspecified: Secondary | ICD-10-CM

## 2016-07-22 DIAGNOSIS — I890 Lymphedema, not elsewhere classified: Secondary | ICD-10-CM | POA: Diagnosis not present

## 2016-07-22 DIAGNOSIS — M4802 Spinal stenosis, cervical region: Secondary | ICD-10-CM | POA: Diagnosis not present

## 2016-07-22 NOTE — Telephone Encounter (Signed)
PT NEEDS REFILL ON PREVAIL WIPES

## 2016-07-22 NOTE — Telephone Encounter (Signed)
Not in med list; please add to med list so I can refill Need frequency, instructions, etc. If DME, then need dx code too please

## 2016-07-25 ENCOUNTER — Other Ambulatory Visit: Payer: Self-pay | Admitting: Family Medicine

## 2016-07-25 DIAGNOSIS — K589 Irritable bowel syndrome without diarrhea: Secondary | ICD-10-CM | POA: Diagnosis not present

## 2016-07-25 DIAGNOSIS — R32 Unspecified urinary incontinence: Secondary | ICD-10-CM | POA: Diagnosis not present

## 2016-07-25 DIAGNOSIS — M4802 Spinal stenosis, cervical region: Secondary | ICD-10-CM | POA: Diagnosis not present

## 2016-07-25 DIAGNOSIS — G40909 Epilepsy, unspecified, not intractable, without status epilepticus: Secondary | ICD-10-CM | POA: Diagnosis not present

## 2016-07-25 DIAGNOSIS — I890 Lymphedema, not elsewhere classified: Secondary | ICD-10-CM | POA: Diagnosis not present

## 2016-07-25 DIAGNOSIS — N319 Neuromuscular dysfunction of bladder, unspecified: Secondary | ICD-10-CM

## 2016-07-25 DIAGNOSIS — G35 Multiple sclerosis: Secondary | ICD-10-CM | POA: Diagnosis not present

## 2016-07-25 MED ORDER — PREVAIL WET WIPES MISC: 1.0000 | Freq: Every day | 11 refills | Status: DC | PRN

## 2016-07-25 NOTE — Progress Notes (Signed)
rx refills for wipes

## 2016-07-25 NOTE — Telephone Encounter (Signed)
I've add it to her medication list and the DX code would be Urinary incontinence ( R39.81).

## 2016-07-25 NOTE — Telephone Encounter (Signed)
Thank you When I tried to refill these, it states it is in a future encounter, so I cannot re-order; I will close this note and try again

## 2016-07-26 DIAGNOSIS — G35 Multiple sclerosis: Secondary | ICD-10-CM | POA: Diagnosis not present

## 2016-07-26 DIAGNOSIS — I890 Lymphedema, not elsewhere classified: Secondary | ICD-10-CM | POA: Diagnosis not present

## 2016-07-26 DIAGNOSIS — G40909 Epilepsy, unspecified, not intractable, without status epilepticus: Secondary | ICD-10-CM | POA: Diagnosis not present

## 2016-07-26 DIAGNOSIS — K589 Irritable bowel syndrome without diarrhea: Secondary | ICD-10-CM | POA: Diagnosis not present

## 2016-07-26 DIAGNOSIS — R32 Unspecified urinary incontinence: Secondary | ICD-10-CM | POA: Diagnosis not present

## 2016-07-26 DIAGNOSIS — M4802 Spinal stenosis, cervical region: Secondary | ICD-10-CM | POA: Diagnosis not present

## 2016-07-28 ENCOUNTER — Ambulatory Visit: Payer: Medicare Other | Attending: Nurse Practitioner | Admitting: Nurse Practitioner

## 2016-07-28 ENCOUNTER — Encounter: Payer: Self-pay | Admitting: Nurse Practitioner

## 2016-07-28 ENCOUNTER — Ambulatory Visit
Admission: RE | Admit: 2016-07-28 | Discharge: 2016-07-28 | Disposition: A | Discharge status: Home / Self Care | Payer: Medicare Other | Source: Ambulatory Visit | Attending: Pain Medicine | Admitting: Pain Medicine

## 2016-07-28 ENCOUNTER — Ambulatory Visit
Admission: RE | Admit: 2016-07-28 | Discharge: 2016-07-28 | Disposition: A | Discharge status: Home / Self Care | Payer: Medicare Other | Source: Ambulatory Visit | Attending: Nurse Practitioner | Admitting: Nurse Practitioner

## 2016-07-28 ENCOUNTER — Other Ambulatory Visit: Payer: Self-pay | Admitting: *Deleted

## 2016-07-28 ENCOUNTER — Other Ambulatory Visit: Payer: Self-pay | Admitting: Nurse Practitioner

## 2016-07-28 VITALS — BP 121/52 | HR 68 | Temp 97.9°F | Resp 14 | Ht 64.0 in | Wt 170.0 lb

## 2016-07-28 DIAGNOSIS — Z79891 Long term (current) use of opiate analgesic: Secondary | ICD-10-CM | POA: Diagnosis not present

## 2016-07-28 DIAGNOSIS — G35 Multiple sclerosis: Secondary | ICD-10-CM

## 2016-07-28 DIAGNOSIS — Z981 Arthrodesis status: Secondary | ICD-10-CM | POA: Diagnosis not present

## 2016-07-28 DIAGNOSIS — Z79899 Other long term (current) drug therapy: Secondary | ICD-10-CM | POA: Diagnosis not present

## 2016-07-28 DIAGNOSIS — M542 Cervicalgia: Principal | ICD-10-CM

## 2016-07-28 DIAGNOSIS — G8929 Other chronic pain: Secondary | ICD-10-CM

## 2016-07-28 DIAGNOSIS — M8580 Other specified disorders of bone density and structure, unspecified site: Secondary | ICD-10-CM | POA: Diagnosis not present

## 2016-07-28 DIAGNOSIS — F1190 Opioid use, unspecified, uncomplicated: Secondary | ICD-10-CM

## 2016-07-28 DIAGNOSIS — G894 Chronic pain syndrome: Secondary | ICD-10-CM | POA: Diagnosis not present

## 2016-07-28 DIAGNOSIS — K589 Irritable bowel syndrome without diarrhea: Secondary | ICD-10-CM | POA: Insufficient documentation

## 2016-07-28 DIAGNOSIS — I890 Lymphedema, not elsewhere classified: Secondary | ICD-10-CM | POA: Diagnosis not present

## 2016-07-28 DIAGNOSIS — Z853 Personal history of malignant neoplasm of breast: Secondary | ICD-10-CM | POA: Diagnosis not present

## 2016-07-28 DIAGNOSIS — F4323 Adjustment disorder with mixed anxiety and depressed mood: Secondary | ICD-10-CM | POA: Insufficient documentation

## 2016-07-28 DIAGNOSIS — G40209 Localization-related (focal) (partial) symptomatic epilepsy and epileptic syndromes with complex partial seizures, not intractable, without status epilepticus: Secondary | ICD-10-CM | POA: Insufficient documentation

## 2016-07-28 DIAGNOSIS — G801 Spastic diplegic cerebral palsy: Secondary | ICD-10-CM | POA: Diagnosis not present

## 2016-07-28 DIAGNOSIS — M79604 Pain in right leg: Secondary | ICD-10-CM

## 2016-07-28 DIAGNOSIS — M79605 Pain in left leg: Secondary | ICD-10-CM

## 2016-07-28 MED ORDER — OXYCODONE HCL 5 MG PO TABS: 5.0000 mg | ORAL_TABLET | Freq: Four times a day (QID) | ORAL | 0 refills | Status: DC | PRN

## 2016-07-28 NOTE — Progress Notes (Signed)
Patient's Name: Rebecca [name]  MRN: [id number]  Referring Provider: Lloyd Huger, MD  DOB: 09-Feb-1960  PCP: [name], MD  DOS: 07/28/2016  Note by: Dionisio David NP  Service setting: Ambulatory outpatient  Specialty: Interventional Pain Management  Location: [facility] (AMB) Pain Management Facility    Patient type: New Patient    Primary Reason(s) for Visit: Initial Patient Evaluation CC: Leg Pain (lower)  HPI  Rebecca [name] is a 56 y.o. year old, female patient, who comes today for an initial evaluation. She has 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[place]); Bilateral lower extremity edema; SOB (shortness of breath); Primary cancer of right female breast ([place]); Hematoma complicating a procedure; MS (multiple sclerosis) ([place]); Uterine leiomyoma; History of right mastectomy; Need for immunization against influenza; Medicare annual wellness visit, subsequent; Aortic valve disease; Encounter for eye exam; History of breast cancer in female; Anemia; Thrombocytopenia ([place]); Allergic rhinitis; IBS (irritable bowel syndrome); Preventative health care; Encounter for screening for cervical cancer ; Multiple sclerosis exacerbation ([place]); Seizure disorder ([place]); Slow transit constipation; 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 Her primarily concern today is the Leg Pain (lower)  Pain Assessment: Self-Reported Pain Score: 0-No pain/10             Reported level is compatible with observation.       Pain Type: Chronic  pain Pain Location: Leg Pain Orientation: Right, Left, Lower Pain Descriptors / Indicators:  (twitching) Pain Frequency: Intermittent (pain is worse at night)  Onset and Duration: Gradual, Date of onset: 76 and Date of injury: 2015 Cause of pain: 2001 Diagnosed with MS Severity: Getting worse, No change since onset, NAS-11 at its worse: 9/10, NAS-11 at its best: 0/10, NAS-11 now: 0/10 and NAS-11 on the average: 9/10 Timing: Morning and Night Aggravating Factors: Prolonged sitting Alleviating Factors: Bending, Stretching, Medications and Sitting Associated Problems: Day-time cramps, Night-time cramps, Fatigue, Numbness, Spasms and Tingling Quality of Pain: Aching, Annoying, Constant and Uncomfortable Previous Examinations or Tests: MRI scan Previous Treatments: Physical Therapy  The patient comes into the clinics today for the first time for a chronic pain management evaluation. According to the patient she is having bilateral lower extremity pain. She admits that this is secondary to lymphedema. She has multiple sclerosis she is followed by Dr Leta Baptist, [facility] neurology. She admits that in April she went to the ER secondary to inability to walk. She admits that she was discharged to inpatient rehabilitation secondary to continued bilateral lower extremity weakness. she spent a month She was recently in [place] rehabilitation for treatment. She admits that she did have 2 weeks of in-home physical therapy for strengthening.  She admits that her second area of pain is in her neck. She admits the left side is greater than the right. She has numbness tingling and weakness. She is status post cervical spine surgery secondary to stenosis September 2011. She denies any interventional therapy, physical therapy or recent images.  Today I took the time to provide the patient with information regarding this pain practice. The patient was informed that the practice is divided into two sections: an  interventional pain management  section, as well as a completely separate and distinct medication management section. I explained that there are procedure days for interventional therapies, and evaluation days for follow-ups and medication management. Because of the amount of documentation required during both, they are kept separated. This means that there is the possibility that she may be scheduled for a procedure on one day, and medication management the next. I have also informed her that because of staffing and facility limitations, this practice will no longer take patients for medication management only. To illustrate the reasons for this, I gave the patient the example of surgeons, and how inappropriate it would be to refer a patient to his/her care, just to write for the post-surgical antibiotics on a surgery done by a different surgeon.   Because interventional pain management is part of the board-certified specialty for the doctors, the patient was informed that joining this practice means that they are open to any and all interventional therapies. I made it clear that this does not mean that they will be forced to have any procedures done. What this means is that I believe interventional therapies to be essential part of the diagnosis and proper management of chronic pain conditions. Therefore, patients not interested in these interventional alternatives will be better served under the care of a different practitioner.  The patient was also made aware of my Comprehensive Pain Management Safety Guidelines where by joining this practice, they limit all of their nerve blocks and joint injections to those done by our practice, for as long as we are retained to manage their care. Historic Controlled Substance Pharmacotherapy Review  PMP and historical list of controlled substances: Oxycodone 5 mg twice daily, fentanyl 50 g patch every 72 hours, oxycodone 10 mg 4 times daily, fentanyl 25 g every 72  hours, oxycodone/acetaminophen 5/325 mg 4 times daily, hydrocodone/acetaminophen 5/325 mg 4 times daily, acetaminophen with codeine #3 6 times daily, Highest opioid analgesic regimen found: Oxycodone 10 mg 4 times daily, fentanyl 50 g every 72 hours Most recent opioid analgesic: Oxycodone 5 mg twice daily, fentanyl 50 g every 72 hours Current opioid analgesics: Oxycodone 5 mg twice daily, fentanyl 50 g every 72 hours Highest recorded MME/day: 180 mg/day MME/day: 150 mg/day Medications: The patient did not bring the medication(s) to the appointment, as requested in our "New Patient Package" Pharmacodynamics: Desired effects: Analgesia: The patient reports >50% benefit. Reported improvement in function: The patient reports medication allows her to accomplish basic ADLs. Clinically meaningful improvement in function (CMIF): Sustained CMIF goals met Perceived effectiveness: Described as relatively effective, allowing for increase in activities of daily living (ADL) Undesirable effects: Side-effects or Adverse reactions: None reported Historical Monitoring: The patient  reports that she does not use drugs. List of all UDS Test(s): No results found for: MDMA, COCAINSCRNUR, PCPSCRNUR, PCPQUANT, CANNABQUANT, THCU, [place] List of all Serum Drug Screening Test(s):  Lab Results  Component Value Date   AMPHSCRSER Negative 07/28/2016   BARBSCRSER Negative 07/28/2016   BENZOSCRSER Negative 07/28/2016   COCAINSCRSER Negative 07/28/2016   PCPSCRSER Negative 07/28/2016   THCSCRSER Negative 07/28/2016   OPIATESCRSER Negative 07/28/2016   OXYSCRSER Negative 07/28/2016   PROPOXSCRSER Negative 07/28/2016   Historical Background Evaluation: [place] PDMP: Six (6) year initial data search conducted.             [place] Department of public safety, offender search: Editor, commissioning Information) Non-contributory Risk Assessment Profile: Aberrant behavior: None observed or detected today Risk factors for fatal opioid  overdose: Caucasian and High  daily dosage Fatal overdose hazard ratio (HR): 2.04 for doses equal to, or higher than 100 MME/day Non-fatal overdose hazard ratio (HR): 8.87 for 100-199 MME/day Risk of opioid abuse or dependence: 0.7-3.0% with doses ? 36 MME/day and 6.1-26% with doses ? 120 MME/day. Substance use disorder (SUD) risk level: Pending results of Medical Psychology Evaluation for SUD Opioid risk tool (ORT) (Total Score):    ORT Scoring interpretation table:  Score <3 = Low Risk for SUD  Score between 4-7 = Moderate Risk for SUD  Score >8 = High Risk for Opioid Abuse   PHQ-2 Depression Scale:  Total score: 0  PHQ-2 Scoring interpretation table: (Score and probability of major depressive disorder)  Score 0 = No depression  Score 1 = 15.4% Probability  Score 2 = 21.1% Probability  Score 3 = 38.4% Probability  Score 4 = 45.5% Probability  Score 5 = 56.4% Probability  Score 6 = 78.6% Probability   PHQ-9 Depression Scale:  Total score: 0  PHQ-9 Scoring interpretation table:  Score 0-4 = No depression  Score 5-9 = Mild depression  Score 10-14 = Moderate depression  Score 15-19 = Moderately severe depression  Score 20-27 = Severe depression (2.4 times higher risk of SUD and 2.89 times higher risk of overuse)   Pharmacologic Plan: Pending ordered tests and/or consults  Meds  The patient has a current medication list which includes the following prescription(s): amantadine, baclofen, fentanyl, glucosamine-chondroitin, ibuprofen, prevail wet wipes, interferon beta-1a, levetiracetam, multiple vitamins-minerals, oxybutynin, prochlorperazine, and oxycodone.  Current Outpatient Prescriptions on File Prior to Visit  Medication Sig  . amantadine (SYMMETREL) 100 MG capsule Take 1 capsule (100 mg total) by mouth 3 (three) times daily.  . baclofen (LIORESAL) 20 MG tablet Take 1 tablet (20 mg total) by mouth 3 (three) times daily.  . fentaNYL (DURAGESIC - DOSED MCG/HR) 50 MCG/HR Place 1  patch (50 mcg total) onto the skin every 3 (three) days.  Marland Kitchen glucosamine-chondroitin 500-400 MG tablet Take 1 tablet by mouth 4 (four) times daily.  Marland Kitchen ibuprofen (ADVIL,MOTRIN) 600 MG tablet TAKE 1 TABLET (600 MG TOTAL) BY MOUTH EVERY 6 (SIX) HOURS AS NEEDED.  . Incontinence Supply Disposable (PREVAIL WET WIPES) MISC Apply 1 each topically daily as needed. (dx neurogenic bladder N31.9, LON 99 months)  . Interferon Beta-1a (REBIF REBIDOSE) 44 MCG/0.5ML SOAJ Inject 0.5 mLs into the skin 3 (three) times a week. (Patient taking differently: Inject 0.5 mLs into the skin 3 (three) times a week. Monday, Wednesday and Friday)  . levETIRAcetam (KEPPRA) 500 MG tablet Take 1 tablet (500 mg total) by mouth 2 (two) times daily.  . Multiple Vitamins-Minerals (MULTIVITAMIN PO) Take 1 tablet by mouth 2 (two) times daily.   Marland Kitchen oxybutynin (DITROPAN-XL) 10 MG 24 hr tablet TAKE 2 TABLETS BY MOUTH DAILY  . prochlorperazine (COMPAZINE) 10 MG tablet TAKE 1 TABLET (10 MG TOTAL) BY MOUTH EVERY 6 (SIX) HOURS AS NEEDED.   No current facility-administered medications on file prior to visit.    Imaging Review  Cervical Imaging: Cervical MR wo contrast:  Results for orders placed during the hospital encounter of 10/04/11  MR Cervical Spine Wo Contrast   Narrative *RADIOLOGY REPORT*  Clinical Data: 56 year old female with neck pain.  Right upper extremity numbness and tingling in the right hand.  Diagnosed with multiple sclerosis in 2001.  MRI CERVICAL SPINE WITHOUT CONTRAST  Technique:  Multiplanar and multiecho pulse sequences of the cervical spine, to include the craniocervical junction and cervicothoracic junction, were obtained  according to standard protocol without intravenous contrast.  Comparison: 08/03/2011, 11/10/2010.  Findings: Improved image quality today compared to 08/03/2011. Multi focal patchy and nodular abnormal T2 and STIR signal throughout the cervical spinal cord from the  cervicomedullary junction to the upper thoracic spine.  Dorsal and lateral white matter tracts affected at multiple levels.  Mild expansion of the dorsal columns related to the findings at C3-C4.  Additionally there are scattered small foci of T2 and STIR hyperintensity in the brain stem and at the right middle cerebellar peduncle.  Superimposed cervical spine degenerative changes occur with spinal stenosis at multiple levels as detailed below.  Visualized paraspinal soft tissues are within normal limits. Cervical vertebral height and alignment are stable. No marrow edema or evidence of acute osseous abnormality.  C2-C3:  Moderate left facet hypertrophy.  Small central disc protrusion.  Ligament flavum hypertrophy.  Minimal spinal stenosis with no cord mass effect.  No significant foraminal stenosis.  C3-C4:  Chronic circumferential disc osteophyte complex.  Mild facet and ligament flavum hypertrophy.  Spinal stenosis with minimal flattening of the ventral cord.  Moderate left and severe right C4 foraminal stenosis.  Overall, this level appears stable.  C4-C5:  Negative disc.  Ligament flavum hypertrophy.  No significant stenosis.  C5-C6:  Bulky circumferential disc osteophyte complex with prominent right lateral recess broad-based component (series 5 image 14).  Ligament flavum hypertrophy.  Spinal cord with mass effect mostly on the right hemi cord.  Moderate right C6 foraminal stenosis. This level appears stable or mildly progressed.  C6-C7:  Chronic circumferential disc bulge.  Moderate sized superimposed right paracentral disc extrusion with cephalad and caudal migration of disc.  Moderate to severe ligament flavum hypertrophy.  Spinal stenosis with flattening of the spinal cord. Mild to moderate bilateral C7 foraminal stenosis. This level has progressed since 2012.  C7-T1:  Negative disc.  Moderate facet hypertrophy.  No spinal stenosis.  No significant foraminal  stenosis.  Negative visualized upper thoracic level with regard to degenerative changes except for mild facet hypertrophy.  There is evidence of upper thoracic spinal cord signal abnormality at T2-T3.  IMPRESSION: 1.  Multifactorial degenerative spinal stenosis with varying degrees of cord compression at C5-C6 and C6-C7.  Mild spinal stenosis at C3-C4.  Changes are worst at the C6-C7 level which does appear progressed since 2012. 2.  Superimposed advanced chronic demyelinating disease with widespread cervical and visualized upper thoracic spinal cord signal abnormality.  No definite signal abnormality directly corresponding to the compressive myelopathy.   Original Report Authenticated By: Randall An, M.D.    Cervical MR w/wo contrast:  Results for orders placed during the hospital encounter of 05/14/16  MR Cervical Spine W or Wo Contrast   Narrative CLINICAL DATA:  Leg weakness for 5 days. History of multiple sclerosis, endocarditis, cervical spine stenosis, breast cancer, ACDF 2013.  EXAM: MRI HEAD WITHOUT AND WITH CONTRAST  MRI CERVICAL SPINE WITHOUT AND WITH CONTRAST  MRI THORACIC SPINE WITHOUT AND WITH CONTRAST  TECHNIQUE: Multiplanar, multiecho pulse sequences of the brain and surrounding structures, and cervical spine, to include the craniocervical junction and thoracic spine, were obtained without and with intravenous contrast.  CONTRAST:  86m MULTIHANCE GADOBENATE DIMEGLUMINE 529 MG/ML IV SOLN  COMPARISON:  MRI of the head October 07, 2015, MRI of the cervical spine August 18, 2013 and MRI of the thoracic spine August 27, 2013  FINDINGS: MRI HEAD FINDINGS- mildly motion degraded examination.  INTRACRANIAL CONTENTS: No reduced diffusion to suggest acute ischemia.  No susceptibility artifact to suggest hemorrhage. Confluent supratentorial white matter FLAIR T2 hyperintensities, predominately radiating from the periventricular margin subcentimeter RIGHT  frontal cortical lesion. At least 3 infratentorial white matter lesions. Patchy RIGHT thalamus to posterior limb of the internal capsular lesion is similar. Lesions demonstrate low T1 signal compatible with black holes of demyelination. Dominant 2.4 cm LEFT periatrial white matter lesion. No enhancing lesions, intracranial masses or mass effect. No parenchymal brain volume loss for age. No abnormal extra-axial fluid collections or abnormal enhancement.  VASCULAR: Normal major intracranial vascular flow voids present at skull base.  SKULL AND UPPER CERVICAL SPINE: No abnormal sellar expansion. No suspicious calvarial bone marrow signal. Craniocervical junction maintained.  SINUSES/ORBITS: The mastoid air-cells and included paranasal sinuses are well-aerated.The included ocular globes and orbital contents are non-suspicious.  OTHER: None.  MRI CERVICAL SPINE FINDINGS- moderately motion degraded examination.  ALIGNMENT: Maintained cervical lordosis.  No malalignment.  VERTEBRAE/DISCS: Vertebral bodies are intact. Status post C5-6 and C6-7 ACDF with suspected nonunion at C6-7. Intervertebral disc morphology generally maintained with decreased T2 signal compatible with mild desiccation. Mild chronic discogenic endplate changes Y1-0. No acute osseous process. No suspicious osseous or disc enhancement.  CORD:Patchy bright T2 and STIR signal throughout the spinal cord. No myelomalacia or syrinx. No abnormal cord, leptomeningeal upper dural enhancement.  POSTERIOR FOSSA, VERTEBRAL ARTERIES, PARASPINAL TISSUES: No MR findings of ligamentous injury. Vertebral artery flow voids present. Included posterior fossa and paraspinal soft tissues are normal.  DISC LEVELS:  C2-3: Small broad-based disc bulge, uncovertebral hypertrophy. Mild RIGHT, severe LEFT facet arthropathy similar to prior examination with trace LEFT facet effusion. Mild canal stenosis. Mild LEFT neural foraminal  narrowing.  C3-4: Small broad-based central disc protrusion, uncovertebral hypertrophy. Severe RIGHT, moderate LEFT facet arthropathy. Mild canal stenosis. Mild bilateral neural foraminal narrowing.  C4-5: 3 mm broad-based RIGHT central disc protrusion. Uncovertebral hypertrophy and mild to moderate facet arthropathy. Moderate canal stenosis. Severe RIGHT, mild LEFT neural foraminal narrowing.  C5-6: ACDF. Mild facet arthropathy without canal stenosis. Mild RIGHT neural foraminal narrowing.  C6-7: ACDF. No canal stenosis. Uncovertebral hypertrophy without neural foraminal narrowing.  C7-T1: No disc bulge, canal stenosis nor neural foraminal narrowing. Mild RIGHT and moderate LEFT facet arthropathy.  MRI THORACIC SPINE FINDINGS - multiple sequences are mildly motion degraded.  ALIGNMENT: Maintenance of the thoracic kyphosis. No malalignment.  VERTEBRAE/DISCS: Vertebral bodies are intact. Intervertebral discs morphology and signal are normal. Multilevel mild chronic discogenic endplate changes. No abnormal bone marrow signal. No abnormal osseous or discal enhancement.  CORD: Slightly heterogeneous upper thoracic spinal cord signal though, limited by patient motion. No abnormal cord, leptomeningeal or epidural enhancement. On prior examination there were patchy demyelinating plaques through the level of the conus medullaris which are not evident today, likely obscured by motion.  PREVERTEBRAL AND PARASPINAL SOFT TISSUES: Mild epidural lipomatosis slightly effacing the dorsal thecal sac.  DISC LEVELS:  T1-2 through T5-6: No disc bulge, canal stenosis nor neural foraminal narrowing.  T6-7 broad-based small LEFT central disc protrusion without canal stenosis or neural foraminal narrowing.  T7-8 through T12-L1: No disc bulge, canal stenosis nor neural foraminal narrowing.  IMPRESSION: MRI HEAD: Stable examination: Severe chronic demyelination without new lesions or acute  inflammation. No parenchymal brain volume loss for age.  MRI CERVICAL SPINE: Moderately motion degraded examination. Chronic demyelination without myelomalacia. No enhancement to suggest acute inflammation.  Status post C5 through C7 ACDF with suspected nonunion C6-7, recommend cervical spine radiographs.  Moderate canal stenosis C4-5 consistent with adjacent  segment disease. Mild canal stenosis C2-3 and C3-4. Multilevel neural foraminal narrowing: Severe on the RIGHT at C4-5.  MRI THORACIC SPINE: Suspected chronic demyelination of the upper thoracic spinal cord though, limited assessment due to patient motion. No enhancement to suggest acute inflammation.  Early degenerative change of thoracic spine without canal stenosis or neural foraminal narrowing.   Electronically Signed   By: Elon Alas M.D.   On: 05/14/2016 21:51    Cervical DG 2-3 views:  Results for orders placed during the hospital encounter of 11/17/11  DG Cervical Spine 2-3 Views   Narrative *RADIOLOGY REPORT*  Clinical Data: ACDF at C5-C7.  CERVICAL SPINE - 2-3 VIEW  Comparison: MRI of the cervical spine 10/04/2011.  Findings: Two intraoperative cross-table lateral views of the cervical spine are submitted.  Film #1 demonstrates an intubated patient with surgical probes in place anteriorly at the C5-C6 and C6-C7 interspace.  Film #2 demonstrates postoperative changes of ACDF from C5-C7 with interbody graft in place at the C5-C6 and C6- C7 levels.  IMPRESSION: Intraoperative documentation of ACDF from C5-C7, as above, without immediate complicating features.   Original Report Authenticated By: Etheleen Mayhew, M.D.    Thoracic Imaging:  Thoracic MR w/wo contrast:  Results for orders placed during the hospital encounter of 05/14/16  MR THORACIC SPINE W WO CONTRAST   Narrative CLINICAL DATA:  Leg weakness for 5 days. History of multiple sclerosis, endocarditis, cervical spine stenosis,  breast cancer, ACDF 2013.  EXAM: MRI HEAD WITHOUT AND WITH CONTRAST  MRI CERVICAL SPINE WITHOUT AND WITH CONTRAST  MRI THORACIC SPINE WITHOUT AND WITH CONTRAST  TECHNIQUE: Multiplanar, multiecho pulse sequences of the brain and surrounding structures, and cervical spine, to include the craniocervical junction and thoracic spine, were obtained without and with intravenous contrast.  CONTRAST:  61m MULTIHANCE GADOBENATE DIMEGLUMINE 529 MG/ML IV SOLN  COMPARISON:  MRI of the head October 07, 2015, MRI of the cervical spine August 18, 2013 and MRI of the thoracic spine August 27, 2013  FINDINGS: MRI HEAD FINDINGS- mildly motion degraded examination.  INTRACRANIAL CONTENTS: No reduced diffusion to suggest acute ischemia. No susceptibility artifact to suggest hemorrhage. Confluent supratentorial white matter FLAIR T2 hyperintensities, predominately radiating from the periventricular margin subcentimeter RIGHT frontal cortical lesion. At least 3 infratentorial white matter lesions. Patchy RIGHT thalamus to posterior limb of the internal capsular lesion is similar. Lesions demonstrate low T1 signal compatible with black holes of demyelination. Dominant 2.4 cm LEFT periatrial white matter lesion. No enhancing lesions, intracranial masses or mass effect. No parenchymal brain volume loss for age. No abnormal extra-axial fluid collections or abnormal enhancement.  VASCULAR: Normal major intracranial vascular flow voids present at skull base.  SKULL AND UPPER CERVICAL SPINE: No abnormal sellar expansion. No suspicious calvarial bone marrow signal. Craniocervical junction maintained.  SINUSES/ORBITS: The mastoid air-cells and included paranasal sinuses are well-aerated.The included ocular globes and orbital contents are non-suspicious.  OTHER: None.  MRI CERVICAL SPINE FINDINGS- moderately motion degraded examination.  ALIGNMENT: Maintained cervical lordosis.  No  malalignment.  VERTEBRAE/DISCS: Vertebral bodies are intact. Status post C5-6 and C6-7 ACDF with suspected nonunion at C6-7. Intervertebral disc morphology generally maintained with decreased T2 signal compatible with mild desiccation. Mild chronic discogenic endplate changes CK3-8 No acute osseous process. No suspicious osseous or disc enhancement.  CORD:Patchy bright T2 and STIR signal throughout the spinal cord. No myelomalacia or syrinx. No abnormal cord, leptomeningeal upper dural enhancement.  POSTERIOR FOSSA, VERTEBRAL ARTERIES, PARASPINAL TISSUES: No MR findings of  ligamentous injury. Vertebral artery flow voids present. Included posterior fossa and paraspinal soft tissues are normal.  DISC LEVELS:  C2-3: Small broad-based disc bulge, uncovertebral hypertrophy. Mild RIGHT, severe LEFT facet arthropathy similar to prior examination with trace LEFT facet effusion. Mild canal stenosis. Mild LEFT neural foraminal narrowing.  C3-4: Small broad-based central disc protrusion, uncovertebral hypertrophy. Severe RIGHT, moderate LEFT facet arthropathy. Mild canal stenosis. Mild bilateral neural foraminal narrowing.  C4-5: 3 mm broad-based RIGHT central disc protrusion. Uncovertebral hypertrophy and mild to moderate facet arthropathy. Moderate canal stenosis. Severe RIGHT, mild LEFT neural foraminal narrowing.  C5-6: ACDF. Mild facet arthropathy without canal stenosis. Mild RIGHT neural foraminal narrowing.  C6-7: ACDF. No canal stenosis. Uncovertebral hypertrophy without neural foraminal narrowing.  C7-T1: No disc bulge, canal stenosis nor neural foraminal narrowing. Mild RIGHT and moderate LEFT facet arthropathy.  MRI THORACIC SPINE FINDINGS - multiple sequences are mildly motion degraded.  ALIGNMENT: Maintenance of the thoracic kyphosis. No malalignment.  VERTEBRAE/DISCS: Vertebral bodies are intact. Intervertebral discs morphology and signal are normal. Multilevel mild  chronic discogenic endplate changes. No abnormal bone marrow signal. No abnormal osseous or discal enhancement.  CORD: Slightly heterogeneous upper thoracic spinal cord signal though, limited by patient motion. No abnormal cord, leptomeningeal or epidural enhancement. On prior examination there were patchy demyelinating plaques through the level of the conus medullaris which are not evident today, likely obscured by motion.  PREVERTEBRAL AND PARASPINAL SOFT TISSUES: Mild epidural lipomatosis slightly effacing the dorsal thecal sac.  DISC LEVELS:  T1-2 through T5-6: No disc bulge, canal stenosis nor neural foraminal narrowing.  T6-7 broad-based small LEFT central disc protrusion without canal stenosis or neural foraminal narrowing.  T7-8 through T12-L1: No disc bulge, canal stenosis nor neural foraminal narrowing.  IMPRESSION: MRI HEAD: Stable examination: Severe chronic demyelination without new lesions or acute inflammation. No parenchymal brain volume loss for age.  MRI CERVICAL SPINE: Moderately motion degraded examination. Chronic demyelination without myelomalacia. No enhancement to suggest acute inflammation.  Status post C5 through C7 ACDF with suspected nonunion C6-7, recommend cervical spine radiographs.  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 Mild canal stenosis C2-3 and C3-4. Multilevel neural foraminal narrowing: Severe on the RIGHT at C4-5.  MRI THORACIC SPINE: Suspected chronic demyelination of the upper thoracic spinal cord though, limited assessment due to patient motion. No enhancement to suggest acute inflammation.  Early degenerative change of thoracic spine without canal stenosis or neural foraminal narrowing.   Electronically Signed   By: Elon Alas M.D.   On: 05/14/2016 21:51    Note: Available results from prior imaging studies were reviewed.        ROS  Cardiovascular History: Heart trouble and  Heart murmur Pulmonary or Respiratory History: Negative for bronchial asthma, emphysema, chronic smoking, chronic bronchitis, sarcoidosis, tuberculosis or sleep apena Neurological History: Seizure disorder, Epilepsy (Date of last attack: ?) and Incontinence:  Urinary Review of Past Neurological Studies:  Results for orders placed or performed during the hospital encounter of 05/14/16  MR Brain W and Wo Contrast   Narrative   CLINICAL DATA:  Leg weakness for 5 days. History of multiple sclerosis, endocarditis, cervical spine stenosis, breast cancer, ACDF 2013.  EXAM: MRI HEAD WITHOUT AND WITH CONTRAST  MRI CERVICAL SPINE WITHOUT AND WITH CONTRAST  MRI THORACIC SPINE WITHOUT AND WITH CONTRAST  TECHNIQUE: Multiplanar, multiecho pulse sequences of the brain and surrounding structures, and cervical spine, to include the craniocervical junction and thoracic spine, were obtained without and with  intravenous contrast.  CONTRAST:  37m MULTIHANCE GADOBENATE DIMEGLUMINE 529 MG/ML IV SOLN  COMPARISON:  MRI of the head October 07, 2015, MRI of the cervical spine August 18, 2013 and MRI of the thoracic spine August 27, 2013  FINDINGS: MRI HEAD FINDINGS- mildly motion degraded examination.  INTRACRANIAL CONTENTS: No reduced diffusion to suggest acute ischemia. No susceptibility artifact to suggest hemorrhage. Confluent supratentorial white matter FLAIR T2 hyperintensities, predominately radiating from the periventricular margin subcentimeter RIGHT frontal cortical lesion. At least 3 infratentorial white matter lesions. Patchy RIGHT thalamus to posterior limb of the internal capsular lesion is similar. Lesions demonstrate low T1 signal compatible with black holes of demyelination. Dominant 2.4 cm LEFT periatrial white matter lesion. No enhancing lesions, intracranial masses or mass effect. No parenchymal brain volume loss for age. No abnormal extra-axial fluid collections or abnormal  enhancement.  VASCULAR: Normal major intracranial vascular flow voids present at skull base.  SKULL AND UPPER CERVICAL SPINE: No abnormal sellar expansion. No suspicious calvarial bone marrow signal. Craniocervical junction maintained.  SINUSES/ORBITS: The mastoid air-cells and included paranasal sinuses are well-aerated.The included ocular globes and orbital contents are non-suspicious.  OTHER: None.  MRI CERVICAL SPINE FINDINGS- moderately motion degraded examination.  ALIGNMENT: Maintained cervical lordosis.  No malalignment.  VERTEBRAE/DISCS: Vertebral bodies are intact. Status post C5-6 and C6-7 ACDF with suspected nonunion at C6-7. Intervertebral disc morphology generally maintained with decreased T2 signal compatible with mild desiccation. Mild chronic discogenic endplate changes CF6-3 No acute osseous process. No suspicious osseous or disc enhancement.  CORD:Patchy bright T2 and STIR signal throughout the spinal cord. No myelomalacia or syrinx. No abnormal cord, leptomeningeal upper dural enhancement.  POSTERIOR FOSSA, VERTEBRAL ARTERIES, PARASPINAL TISSUES: No MR findings of ligamentous injury. Vertebral artery flow voids present. Included posterior fossa and paraspinal soft tissues are normal.  DISC LEVELS:  C2-3: Small broad-based disc bulge, uncovertebral hypertrophy. Mild RIGHT, severe LEFT facet arthropathy similar to prior examination with trace LEFT facet effusion. Mild canal stenosis. Mild LEFT neural foraminal narrowing.  C3-4: Small broad-based central disc protrusion, uncovertebral hypertrophy. Severe RIGHT, moderate LEFT facet arthropathy. Mild canal stenosis. Mild bilateral neural foraminal narrowing.  C4-5: 3 mm broad-based RIGHT central disc protrusion. Uncovertebral hypertrophy and mild to moderate facet arthropathy. Moderate canal stenosis. Severe RIGHT, mild LEFT neural foraminal narrowing.  C5-6: ACDF. Mild facet arthropathy without canal  stenosis. Mild RIGHT neural foraminal narrowing.  C6-7: ACDF. No canal stenosis. Uncovertebral hypertrophy without neural foraminal narrowing.  C7-T1: No disc bulge, canal stenosis nor neural foraminal narrowing. Mild RIGHT and moderate LEFT facet arthropathy.  MRI THORACIC SPINE FINDINGS - multiple sequences are mildly motion degraded.  ALIGNMENT: Maintenance of the thoracic kyphosis. No malalignment.  VERTEBRAE/DISCS: Vertebral bodies are intact. Intervertebral discs morphology and signal are normal. Multilevel mild chronic discogenic endplate changes. No abnormal bone marrow signal. No abnormal osseous or discal enhancement.  CORD: Slightly heterogeneous upper thoracic spinal cord signal though, limited by patient motion. No abnormal cord, leptomeningeal or epidural enhancement. On prior examination there were patchy demyelinating plaques through the level of the conus medullaris which are not evident today, likely obscured by motion.  PREVERTEBRAL AND PARASPINAL SOFT TISSUES: Mild epidural lipomatosis slightly effacing the dorsal thecal sac.  DISC LEVELS:  T1-2 through T5-6: No disc bulge, canal stenosis nor neural foraminal narrowing.  T6-7 broad-based small LEFT central disc protrusion without canal stenosis or neural foraminal narrowing.  T7-8 through T12-L1: No disc bulge, canal stenosis nor neural foraminal narrowing.  IMPRESSION: MRI HEAD: Stable  examination: Severe chronic demyelination without new lesions or acute inflammation. No parenchymal brain volume loss for age.  MRI CERVICAL SPINE: Moderately motion degraded examination. Chronic demyelination without myelomalacia. No enhancement to suggest acute inflammation.  Status post C5 through C7 ACDF with suspected nonunion C6-7, recommend cervical spine radiographs.  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 Mild canal stenosis C2-3 and C3-4. Multilevel neural foraminal  narrowing: Severe on the RIGHT at C4-5.  MRI THORACIC SPINE: Suspected chronic demyelination of the upper thoracic spinal cord though, limited assessment due to patient motion. No enhancement to suggest acute inflammation.  Early degenerative change of thoracic spine without canal stenosis or neural foraminal narrowing.   Electronically Signed   By: Elon Alas M.D.   On: 05/14/2016 21:51   CT Head Wo Contrast   Narrative   CLINICAL DATA:  Patient with history of MS states she has increased weakness x 3 to 4 days.  EXAM: CT HEAD WITHOUT CONTRAST  TECHNIQUE: Contiguous axial images were obtained from the base of the skull through the vertex without intravenous contrast.  COMPARISON:  CT head dated 02/28/2011 and brain MRI dated 10/07/2015.  FINDINGS: Brain: Mild generalized parenchymal atrophy with commensurate dilatation of the sulci. Ventricles are stable in size and configuration. Patchy areas of low density within the bilateral periventricular and subcortical white matter regions bilaterally, compatible with the white matter changes demonstrated on earlier MRI related to patient's multiple sclerosis.  There is no mass, hemorrhage, edema or other evidence of acute parenchymal abnormality. No extra-axial hemorrhage.  Vascular: There are chronic calcified atherosclerotic changes of the large vessels at the skull base. No unexpected hyperdense vessel.  Skull: Normal. Negative for fracture or focal lesion.  Sinuses/Orbits: No acute finding.  Other: None.  IMPRESSION: 1. No acute findings.  No intracranial mass, hemorrhage or edema. 2. Stable white matter changes, consistent with sequela of patient's multiple sclerosis.   Electronically Signed   By: Franki Cabot M.D.   On: 05/14/2016 16:44    Psychological-Psychiatric History: Negative for anxiety, depression, schizophrenia, bipolar disorders or suicidal ideations or attempts Gastrointestinal History:  Irritable Bowel Syndrome (IBS) and Constipation Genitourinary History: Negative for nephrolithiasis, hematuria, renal failure or chronic kidney disease Hematological History: Brusing easily Endocrine History: Negative for diabetes or thyroid disease Rheumatologic History: Negative for lupus, osteoarthritis, rheumatoid arthritis, myositis, polymyositis or fibromyagia Musculoskeletal History: Multiple sclerosis Work History: Disabled  Allergies  Rebecca [name] is allergic to fentanyl and sulfa antibiotics.  Laboratory Chemistry  Inflammation Markers Lab Results  Component Value Date   CRP <0.3 07/28/2016   ESRSEDRATE 13 07/28/2016   (CRP: Acute Phase) (ESR: Chronic Phase) Renal Function Markers Lab Results  Component Value Date   BUN 12 07/28/2016   CREATININE 0.55 (L) 07/28/2016   GFRAA 121 07/28/2016   GFRNONAA 105 07/28/2016   Hepatic Function Markers Lab Results  Component Value Date   AST 21 07/28/2016   ALT 22 07/28/2016   ALBUMIN 4.3 07/28/2016   ALKPHOS 84 07/28/2016   Electrolytes Lab Results  Component Value Date   NA 143 07/28/2016   K 4.5 07/28/2016   CL 104 07/28/2016   CALCIUM 9.5 07/28/2016   MG 1.9 07/28/2016   Neuropathy Markers Lab Results  Component Value Date   VITAMINB12 599 07/28/2016   Bone Pathology Markers Lab Results  Component Value Date   ALKPHOS 84 07/28/2016   VD25OH 43.1 08/06/2015   25OHVITD1 45 07/28/2016   25OHVITD2 <1.0 07/28/2016   25OHVITD3 45  07/28/2016   CALCIUM 9.5 07/28/2016   Coagulation Parameters Lab Results  Component Value Date   PLT 163 06/27/2016   Cardiovascular Markers Lab Results  Component Value Date   HGB 12.4 06/27/2016   HCT 37.9 06/27/2016   Note: Lab results reviewed.  [place]  Drug: Rebecca [name]  reports that she does not use drugs. Alcohol:  reports that she does not drink alcohol. Tobacco:  reports that she has never smoked. She has never used smokeless tobacco. Medical:  has a past medical  history of Aortic valve disease; Bacterial endocarditis; Bilateral lower extremity edema; Breast cancer ([name]) (12-31-13); Cervical stenosis of spine; Herpes zoster; IBS (irritable bowel syndrome); Lymphedema; Multiple sclerosis ([place]) (2001); Neuromuscular disorder ([place]); Seizures ([place]); and Syncope and collapse. Family: family history includes Breast cancer (age of onset: 2) in her maternal aunt; Breast cancer (age of onset: 20) in her maternal grandmother; Cancer in her father; Heart attack in her father; Heart disease in her father; Ovarian cancer in her cousin; Thyroid disease in her sister.  Past Surgical History:  Procedure Laterality Date  . ANKLE SURGERY     Left  . ANKLE SURGERY    . ANTERIOR CERVICAL DECOMP/DISCECTOMY FUSION  11/17/2011   Procedure: ANTERIOR CERVICAL DECOMPRESSION/DISCECTOMY FUSION 2 LEVELS;  Surgeon: Erline Levine, MD;  Location: [facility];  Service: Neurosurgery;  Laterality: N/A;  Cervical Five-Six Six-Seven Anterior cervical decompression/diskectomy/fusion  . BREAST BIOPSY Right 12-31-13   invasive mammary  . BREAST SURGERY Right 02/03/2014   Right simple mastectomy with sentinel node biopsy.  . CHOLECYSTECTOMY    . COLONOSCOPY  2014  . Lower extremity venous Dopplers  Feb 27, 2013   No LE DVT  . MASTECTOMY    . Port a cath insertion Right 01/19/2010  . PORT-A-CATH REMOVAL     right  . PORT-A-CATH REMOVAL Right 09/03/2013   Procedure: REMOVAL PORT-A-CATH;  Surgeon: Conrad Frontier, MD;  Location: [facility];  Service: Vascular;  Laterality: Right;  . TRANSTHORACIC ECHOCARDIOGRAM  03/2013; 02/2014   a) Normal LV size and function with EF 60-65%.; Cannot exclude bicuspid aortic valve with mild AS and mild AI.; b) Normal EF with normal wall motion and valve function x Mild MR. G2 DD. EF 60-65%. Tricuspid AoV  . UPPER GI ENDOSCOPY  2014   Active Ambulatory Problems    Diagnosis Date Noted  . 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 Bilateral lower  extremity edema   . SOB (shortness of breath) 03/01/2013  . Primary cancer of right female breast ([place]) 01/10/2014  . Hematoma complicating a procedure 03/06/2014  . MS (multiple sclerosis) ([place]) 09/16/2014  . 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 History of right mastectomy 10/24/2014  . Need for immunization against influenza 10/24/2014  . Medicare annual wellness visit, subsequent 10/24/2014  . Aortic valve disease   . Encounter for eye exam 08/25/2015  . History of breast cancer in female 10/28/2015  . Anemia 10/28/2015  . Thrombocytopenia ([place]) 10/28/2015  . Allergic rhinitis 10/28/2015  . IBS (irritable bowel syndrome) 10/28/2015  . Preventative health care 11/25/2015  . Encounter for screening for cervical cancer  11/27/2015  . Multiple sclerosis exacerbation ([place]) 05/17/2016  . Seizure disorder ([place])   . Slow transit constipation   . 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 Lymphedema   . Hypoalbuminemia due to protein-calorie malnutrition ([place])   . Acute blood loss anemia   . Urinary incontinence   . Detrusor and sphincter dyssynergia   . Neurogenic  bowel   . Neurogenic bladder   . Acute lower UTI   . Adjustment disorder with mixed anxiety and depressed mood   . Neutropenia ([place]) 06/27/2016  . Acute cystitis without hematuria 06/03/2016  . Chronic pain syndrome 06/03/2016  . Chronic neck pain (bilateral) ( left greater than right) 07/28/2016  . Long term current use of opiate analgesic 07/28/2016  . Long term prescription opiate use 07/28/2016  . Opiate use 07/28/2016   Resolved Ambulatory Problems    Diagnosis Date Noted  . Multiple sclerosis ([place])   . Herpes zoster 06/08/2011  . Aortic insufficiency   . Heart murmur 10/24/2014  . Lymphedema of both lower extremities 10/24/2014  . Multiple sclerosis ([place]) 05/14/2016  . Neurogenic bladder 05/17/2016  . VZV (varicella-zoster virus) infection   . Herpes zoster without complication    Past Medical History:  Diagnosis Date  .  Aortic valve disease   . Bacterial endocarditis   . Bilateral lower extremity edema   . Breast cancer ([place]) 12-31-13  . Cervical stenosis of spine   . Herpes zoster   . IBS (irritable bowel syndrome)   . Lymphedema   . Multiple sclerosis ([place]) 2001  . Neuromuscular disorder ([place])   . Seizures ([place])   . Syncope and collapse    Constitutional Exam  General appearance: Well nourished, well developed, and well hydrated. In no apparent acute distress Vitals:   07/28/16 1356  BP: (!) 121/52  Pulse: 68  Resp: 14  Temp: 97.9 F (36.6 C)  TempSrc: Oral  SpO2: 100%  Weight: 170 lb (77.1 kg)  Height: 5' 4" (1.626 m)   BMI Assessment: Estimated body mass index is 29.18 kg/m as calculated from the following:   Height as of this encounter: 5' 4" (1.626 m).   Weight as of this encounter: 170 lb (77.1 kg).  BMI interpretation table: BMI level Category Range association with higher incidence of chronic pain  <18 kg/m2 Underweight   18.5-24.9 kg/m2 Ideal body weight   25-29.9 kg/m2 Overweight Increased incidence by 20%  30-34.9 kg/m2 Obese (Class I) Increased incidence by 68%  35-39.9 kg/m2 Severe obesity (Class II) Increased incidence by 136%  >40 kg/m2 Extreme obesity (Class III) Increased incidence by 254%   BMI Readings from Last 4 Encounters:  07/28/16 29.18 kg/m  05/20/16 33.13 kg/m  05/15/16 30.04 kg/m  11/25/15 29.18 kg/m   Wt Readings from Last 4 Encounters:  07/28/16 170 lb (77.1 kg)  05/20/16 193 lb (87.5 kg)  05/14/16 175 lb (79.4 kg)  11/25/15 170 lb (77.1 kg)  Psych/Mental status: Alert, oriented x 3 (person, place, & time)       Eyes: PERLA Respiratory: No evidence of acute respiratory distress  Cervical Spine Exam  Inspection: No masses, redness, or swelling Alignment: Symmetrical Functional ROM: Unrestricted ROM      Stability: No instability detected Muscle strength & Tone: Functionally intact Sensory: Unimpaired Palpation: No palpable anomalies               Upper Extremity (UE) Exam    Side: Right upper extremity  Side: Left upper extremity  Inspection: No masses, redness, swelling, or asymmetry. No contractures  Inspection: No masses, redness, swelling, or asymmetry. No contractures  Functional ROM: Unrestricted ROM          Functional ROM: Unrestricted ROM          Muscle strength & Tone: Functionally intact  Muscle strength & Tone: Functionally intact  Sensory: Unimpaired  Sensory: Unimpaired  Palpation: No palpable anomalies              Palpation: No palpable anomalies              Specialized Test(s): Deferred         Specialized Test(s): Deferred          Thoracic Spine Exam  Inspection: No masses, redness, or swelling Alignment: Symmetrical Functional ROM: Unrestricted ROM Stability: No instability detected Sensory: Unimpaired Muscle strength & Tone: No palpable anomalies  Lumbar Spine Exam  Inspection: No masses, redness, or swelling Alignment: Symmetrical Functional ROM: Unrestricted ROM      Stability: No instability detected Muscle strength & Tone: Functionally intact Sensory: Unimpaired Palpation: No palpable anomalies       Provocative Tests: Lumbar Hyperextension and rotation test: evaluation deferred today       Patrick's Maneuver: evaluation deferred today                    Gait & Posture Assessment  Ambulation: Patient came in today in a wheel chair Gait: Relatively normal for age and body habitus Posture: WNL   Lower Extremity Exam    Side: Right lower extremity  Side: Left lower extremity  Inspection: Swelling with Ace wrap patent   Inspection: Swelling with Ace wrap patent   Functional ROM: Decreased ROM          Functional ROM: Decreased ROM          Muscle strength & Tone: Movement possible against some resistance (4/5)  Muscle strength & Tone: Movement possible against gravity, but not against resistance (3/5)  Sensory: Unimpaired  Sensory: Unimpaired  Palpation: No palpable anomalies   Palpation: No palpable anomalies   Assessment  Primary Diagnosis & Pertinent Problem List: Diagnoses of Chronic neck pain (bilateral) ( left greater than right), Long term current use of opiate analgesic, Long term prescription opiate use, and Opiate use were pertinent to this visit.  Visit Diagnosis: 1. Chronic neck pain (bilateral) ( left greater than right)   2. Long term current use of opiate analgesic   3. Long term prescription opiate use   4. Opiate use    Plan of Care  Initial treatment plan:  Please be advised that as per protocol, today's visit has been an evaluation only. We have not taken over the patient's controlled substance management.  Problem-specific plan: No problem-specific Assessment & Plan notes found for this encounter.  Ordered Lab-work, Procedure(s), Referral(s), & Consult(s): Orders Placed This Encounter  Procedures  . DG Cervical Spine Complete  . Drug Screen 10 W/Conf, Serum  . Comprehensive metabolic panel  . C-reactive protein  . Sedimentation rate  . Magnesium  . 25-Hydroxyvitamin D Lcms D2+D3  . Vitamin B12  . Ambulatory referral to Psychology   Pharmacotherapy: Medications ordered:  No orders of the defined types were placed in this encounter.  Medications administered during this visit: Rebecca [name] had no medications administered during this visit.   Pharmacotherapy under consideration:  Opioid Analgesics: The patient was informed that there is no guarantee that she would be a candidate for opioid analgesics. The decision will be made following CDC guidelines. This decision will be based on the results of diagnostic studies, as well as Rebecca [name]'s risk profile.  Membrane stabilizer: To be determined at a later time Muscle relaxant: To be determined at a later time NSAID: To be determined at a later time Other analgesic(s): To be determined at a later time  Interventional therapies under consideration: Rebecca [name] was informed that there  is no guarantee that she would be a candidate for interventional therapies. The decision will be based on the results of diagnostic studies, as well as Rebecca [name]'s risk profile.  Possible procedure(s): Possible bilateral cervical epidural steroid Possible bilateral cervical lumbar facet injection Possible bilateral lumbar radiofrequency ablation    Provider-requested follow-up: Return for 2nd Visit, w/ Dr. Dossie Arbour, after [facility] eval.  Future Appointments Date Time Provider [place]  08/01/2016 2:00 PM Penni Bombard, MD [facility] None  08/02/2016 10:30 AM [facility] None  08/02/2016 10:45 AM Grayland Ormond, Kathlene November, MD [facility] None  08/09/2016 3:20 PM Minna Merritts, MD [facility]  10/20/2016 1:00 PM Jamse Arn, MD [facility] CPR  10/28/2016 1:20 PM [name], MD [place] None  11/29/2016 11:00 AM [name], MD [place] None  03/17/2017 3:00 PM Harlin Heys, MD [facility] None    Primary Care Physician: [name], MD Location: [facility] Note by:  Date: 07/28/2016; Time: 1:20 PM  Pain Score Disclaimer: We use the NRS-11 scale. This is a self-reported, subjective measurement of pain severity with only modest accuracy. It is used primarily to identify changes within a particular patient. It must be understood that outpatient pain scales are significantly less accurate that those used for research, where they can be applied under ideal controlled circumstances with minimal exposure to variables. In reality, the score is likely to be a combination of pain intensity and pain affect, where pain affect describes the degree of emotional arousal or changes in action readiness caused by the sensory experience of pain. Factors such as social and work situation, setting, emotional state, anxiety levels, expectation, and prior pain experience may influence pain perception and show large inter-individual differences  that may also be affected by time variables.  Patient instructions provided during this appointment: Patient Instructions    ____________________________________________________________________________________________  Appointment Policy Summary  It is our goal and responsibility to provide the medical community with assistance in the evaluation and management of patients with chronic pain. Unfortunately our resources are limited. Because we do not have an unlimited amount of time, or available appointments, we are required to closely monitor and manage their use. The following rules exist to maximize their use:  Patient's responsibilities: 1. Punctuality: You are required to be physically present and registered in our facility at least 30 minutes before your appointment. 2. Tardiness: The cutoff is your appointment time. If you have an appointment scheduled for 10:00 AM and you arrive at 10:01, you will be required to reschedule your appointment.  3. Plan ahead: Always assume that you will encounter traffic on your way in. Plan for it. If you are dependent on a driver, make sure they understand these rules and the need to arrive early. 4. Other appointments and responsibilities: Avoid scheduling any other appointments before or after your pain clinic appointments.  5. Be prepared: Write down everything that you need to discuss with your healthcare provider and give this information to the admitting nurse. Write down the medications that you will need refilled. Bring your pills and bottles (even the empty ones), to all of your appointments, except for those where a procedure is scheduled. 6. No children or pets: Find someone to take care of them. It is not appropriate to bring them in. 7. Scheduling changes: We request "advanced notification" of any changes or cancellations. 8. Advanced notification: Defined as a time  period of more than 24 hours prior to the originally scheduled appointment.  This allows for the appointment to be offered to other patients. 9. Rescheduling: When a visit is rescheduled, it will require the cancellation of the original appointment. For this reason they both fall within the category of "Cancellations".  10. Cancellations: They require advanced notification. Any cancellation less than 24 hours before the  appointment will be recorded as a "No Show". 11. No Show: Defined as an unkept appointment where the patient failed to notify or declare to the practice their intention or inability to keep the appointment.  Corrective process for repeat offenders:  1. Tardiness: Three (3) episodes of rescheduling due to late arrivals will be recorded as one (1) "No Show". 2. Cancellation or reschedule: Three (3) cancellations or rescheduling will be recorded as one (1) "No Show". 3. "No Shows": Three (3) "No Shows" within a 12 month period will result in discharge from the practice.  ____________________________________________________________________________________________  ____________________________________________________________________________________________  Pain Scale  Introduction: The pain score used by this practice is the Verbal Numerical Rating Scale (VNRS-11). This is an 11-point scale. It is for adults and children 10 years or older. There are significant differences in how the pain score is reported, used, and applied. Forget everything you learned in the past and learn this scoring system.  General Information: The scale should reflect your current level of pain. Unless you are specifically asked for the level of your worst pain, or your average pain. If you are asked for one of these two, then it should be understood that it is over the past 24 hours.  Basic Activities of Daily Living (ADL): Personal hygiene, dressing, eating, transferring, and using restroom.  Instructions: Most patients tend to report their level of pain as a combination of two  factors, their physical pain and their psychosocial pain. This last one is also known as "suffering" and it is reflection of how physical pain affects you socially and psychologically. From now on, report them separately. From this point on, when asked to report your pain level, report only your physical pain. Use the following table for reference.  Pain Clinic Pain Levels (0-5/10)  Pain Level Score  Description  No Pain 0   Mild pain 1 Nagging, annoying, but does not interfere with basic activities of daily living (ADL). Patients are able to eat, bathe, get dressed, toileting (being able to get on and off the toilet and perform personal hygiene functions), transfer (move in and out of bed or a chair without assistance), and maintain continence (able to control bladder and bowel functions). Blood pressure and heart rate are unaffected. A normal heart rate for a healthy adult ranges from 60 to 100 bpm (beats per minute).   Mild to moderate pain 2 Noticeable and distracting. Impossible to hide from other people. More frequent flare-ups. Still possible to adapt and function close to normal. It can be very annoying and may have occasional stronger flare-ups. With discipline, patients may get used to it and adapt.   Moderate pain 3 Interferes significantly with activities of daily living (ADL). It becomes difficult to feed, bathe, get dressed, get on and off the toilet or to perform personal hygiene functions. Difficult to get in and out of bed or a chair without assistance. Very distracting. With effort, it can be ignored when deeply involved in activities.   Moderately severe pain 4 Impossible to ignore for more than a few minutes. With effort, patients may still be able  to manage work or participate in some social activities. Very difficult to concentrate. Signs of autonomic nervous system discharge are evident: dilated pupils (mydriasis); mild sweating (diaphoresis); sleep interference. Heart rate becomes  elevated (>115 bpm). Diastolic blood pressure (lower number) rises above 100 mmHg. Patients find relief in laying down and not moving.   Severe pain 5 Intense and extremely unpleasant. Associated with frowning face and frequent crying. Pain overwhelms the senses.  Ability to do any activity or maintain social relationships becomes significantly limited. Conversation becomes difficult. Pacing back and forth is common, as getting into a comfortable position is nearly impossible. Pain wakes you up from deep sleep. Physical signs will be obvious: pupillary dilation; increased sweating; goosebumps; brisk reflexes; cold, clammy hands and feet; nausea, vomiting or dry heaves; loss of appetite; significant sleep disturbance with inability to fall asleep or to remain asleep. When persistent, significant weight loss is observed due to the complete loss of appetite and sleep deprivation.  Blood pressure and heart rate becomes significantly elevated. Caution: If elevated blood pressure triggers a pounding headache associated with blurred vision, then the patient should immediately seek attention at an urgent or emergency care unit, as these may be signs of an impending stroke.    Emergency Department Pain Levels (6-10/10)  Emergency Room Pain 6 Severely limiting. Requires emergency care and should not be seen or managed at an outpatient pain management facility. Communication becomes difficult and requires great effort. Assistance to reach the emergency department may be required. Facial flushing and profuse sweating along with potentially dangerous increases in heart rate and blood pressure will be evident.   Distressing pain 7 Self-care is very difficult. Assistance is required to transport, or use restroom. Assistance to reach the emergency department will be required. Tasks requiring coordination, such as bathing and getting dressed become very difficult.   Disabling pain 8 Self-care is no longer possible. At  this level, pain is disabling. The individual is unable to do even the most "basic" activities such as walking, eating, bathing, dressing, transferring to a bed, or toileting. Fine motor skills are lost. It is difficult to think clearly.   Incapacitating pain 9 Pain becomes incapacitating. Thought processing is no longer possible. Difficult to remember your own name. Control of movement and coordination are lost.   The worst pain imaginable 10 At this level, most patients pass out from pain. When this level is reached, collapse of the autonomic nervous system occurs, leading to a sudden drop in blood pressure and heart rate. This in turn results in a temporary and dramatic drop in blood flow to the brain, leading to a loss of consciousness. Fainting is one of the body's self defense mechanisms. Passing out puts the brain in a calmed state and causes it to shut down for a while, in order to begin the healing process.    Summary: 1. Refer to this scale when providing [place] with your pain level. 2. Be accurate and careful when reporting your pain level. This will help with your care. 3. Over-reporting your pain level will lead to loss of credibility. 4. Even a level of 1/10 means that there is pain and will be treated at our facility. 5. High, inaccurate reporting will be documented as "Symptom Exaggeration", leading to loss of credibility and suspicions of possible secondary gains such as obtaining more narcotics, or wanting to appear disabled, for fraudulent reasons. 6. Only pain levels of 5 or below will be seen at our facility. 7. Pain levels  of 6 and above will be sent to the Emergency Department and the appointment cancelled. ____________________________________________________________________________________________

## 2016-07-28 NOTE — Patient Instructions (Signed)
____________________________________________________________________________________________  Appointment Policy Summary  It is our goal and responsibility to provide the medical community with assistance in the evaluation and management of patients with chronic pain. Unfortunately our resources are limited. Because we do not have an unlimited amount of time, or available appointments, we are required to closely monitor and manage their use. The following rules exist to maximize their use:  Patient's responsibilities: 1. Punctuality: You are required to be physically present and registered in our facility at least 30 minutes before your appointment. 2. Tardiness: The cutoff is your appointment time. If you have an appointment scheduled for 10:00 AM and you arrive at 10:01, you will be required to reschedule your appointment.  3. Plan ahead: Always assume that you will encounter traffic on your way in. Plan for it. If you are dependent on a driver, make sure they understand these rules and the need to arrive early. 4. Other appointments and responsibilities: Avoid scheduling any other appointments before or after your pain clinic appointments.  5. Be prepared: Write down everything that you need to discuss with your healthcare provider and give this information to the admitting nurse. Write down the medications that you will need refilled. Bring your pills and bottles (even the empty ones), to all of your appointments, except for those where a procedure is scheduled. 6. No children or pets: Find someone to take care of them. It is not appropriate to bring them in. 7. Scheduling changes: We request "advanced notification" of any changes or cancellations. 8. Advanced notification: Defined as a time period of more than 24 hours prior to the originally scheduled appointment. This allows for the appointment to be offered to other patients. 9. Rescheduling: When a visit is rescheduled, it will require the  cancellation of the original appointment. For this reason they both fall within the category of "Cancellations".  10. Cancellations: They require advanced notification. Any cancellation less than 24 hours before the  appointment will be recorded as a "No Show". 11. No Show: Defined as an unkept appointment where the patient failed to notify or declare to the practice their intention or inability to keep the appointment.  Corrective process for repeat offenders:  1. Tardiness: Three (3) episodes of rescheduling due to late arrivals will be recorded as one (1) "No Show". 2. Cancellation or reschedule: Three (3) cancellations or rescheduling will be recorded as one (1) "No Show". 3. "No Shows": Three (3) "No Shows" within a 12 month period will result in discharge from the practice.  ____________________________________________________________________________________________  ____________________________________________________________________________________________  Pain Scale  Introduction: The pain score used by this practice is the Verbal Numerical Rating Scale (VNRS-11). This is an 11-point scale. It is for adults and children 10 years or older. There are significant differences in how the pain score is reported, used, and applied. Forget everything you learned in the past and learn this scoring system.  General Information: The scale should reflect your current level of pain. Unless you are specifically asked for the level of your worst pain, or your average pain. If you are asked for one of these two, then it should be understood that it is over the past 24 hours.  Basic Activities of Daily Living (ADL): Personal hygiene, dressing, eating, transferring, and using restroom.  Instructions: Most patients tend to report their level of pain as a combination of two factors, their physical pain and their psychosocial pain. This last one is also known as "suffering" and it is reflection of how  physical   pain affects you socially and psychologically. From now on, report them separately. From this point on, when asked to report your pain level, report only your physical pain. Use the following table for reference.  Pain Clinic Pain Levels (0-5/10)  Pain Level Score  Description  No Pain 0   Mild pain 1 Nagging, annoying, but does not interfere with basic activities of daily living (ADL). Patients are able to eat, bathe, get dressed, toileting (being able to get on and off the toilet and perform personal hygiene functions), transfer (move in and out of bed or a chair without assistance), and maintain continence (able to control bladder and bowel functions). Blood pressure and heart rate are unaffected. A normal heart rate for a healthy adult ranges from 60 to 100 bpm (beats per minute).   Mild to moderate pain 2 Noticeable and distracting. Impossible to hide from other people. More frequent flare-ups. Still possible to adapt and function close to normal. It can be very annoying and may have occasional stronger flare-ups. With discipline, patients may get used to it and adapt.   Moderate pain 3 Interferes significantly with activities of daily living (ADL). It becomes difficult to feed, bathe, get dressed, get on and off the toilet or to perform personal hygiene functions. Difficult to get in and out of bed or a chair without assistance. Very distracting. With effort, it can be ignored when deeply involved in activities.   Moderately severe pain 4 Impossible to ignore for more than a few minutes. With effort, patients may still be able to manage work or participate in some social activities. Very difficult to concentrate. Signs of autonomic nervous system discharge are evident: dilated pupils (mydriasis); mild sweating (diaphoresis); sleep interference. Heart rate becomes elevated (>115 bpm). Diastolic blood pressure (lower number) rises above 100 mmHg. Patients find relief in laying down and not  moving.   Severe pain 5 Intense and extremely unpleasant. Associated with frowning face and frequent crying. Pain overwhelms the senses.  Ability to do any activity or maintain social relationships becomes significantly limited. Conversation becomes difficult. Pacing back and forth is common, as getting into a comfortable position is nearly impossible. Pain wakes you up from deep sleep. Physical signs will be obvious: pupillary dilation; increased sweating; goosebumps; brisk reflexes; cold, clammy hands and feet; nausea, vomiting or dry heaves; loss of appetite; significant sleep disturbance with inability to fall asleep or to remain asleep. When persistent, significant weight loss is observed due to the complete loss of appetite and sleep deprivation.  Blood pressure and heart rate becomes significantly elevated. Caution: If elevated blood pressure triggers a pounding headache associated with blurred vision, then the patient should immediately seek attention at an urgent or emergency care unit, as these may be signs of an impending stroke.    Emergency Department Pain Levels (6-10/10)  Emergency Room Pain 6 Severely limiting. Requires emergency care and should not be seen or managed at an outpatient pain management facility. Communication becomes difficult and requires great effort. Assistance to reach the emergency department may be required. Facial flushing and profuse sweating along with potentially dangerous increases in heart rate and blood pressure will be evident.   Distressing pain 7 Self-care is very difficult. Assistance is required to transport, or use restroom. Assistance to reach the emergency department will be required. Tasks requiring coordination, such as bathing and getting dressed become very difficult.   Disabling pain 8 Self-care is no longer possible. At this level, pain is disabling. The individual   is unable to do even the most "basic" activities such as walking, eating, bathing,  dressing, transferring to a bed, or toileting. Fine motor skills are lost. It is difficult to think clearly.   Incapacitating pain 9 Pain becomes incapacitating. Thought processing is no longer possible. Difficult to remember your own name. Control of movement and coordination are lost.   The worst pain imaginable 10 At this level, most patients pass out from pain. When this level is reached, collapse of the autonomic nervous system occurs, leading to a sudden drop in blood pressure and heart rate. This in turn results in a temporary and dramatic drop in blood flow to the brain, leading to a loss of consciousness. Fainting is one of the body's self defense mechanisms. Passing out puts the brain in a calmed state and causes it to shut down for a while, in order to begin the healing process.    Summary: 1. Refer to this scale when providing [place] with your pain level. 2. Be accurate and careful when reporting your pain level. This will help with your care. 3. Over-reporting your pain level will lead to loss of credibility. 4. Even a level of 1/10 means that there is pain and will be treated at our facility. 5. High, inaccurate reporting will be documented as "Symptom Exaggeration", leading to loss of credibility and suspicions of possible secondary gains such as obtaining more narcotics, or wanting to appear disabled, for fraudulent reasons. 6. Only pain levels of 5 or below will be seen at our facility. 7. Pain levels of 6 and above will be sent to the Emergency Department and the appointment cancelled. ____________________________________________________________________________________________

## 2016-07-28 NOTE — Progress Notes (Signed)
Safety precautions to be maintained throughout the outpatient stay will include: orient to surroundings, keep bed in low position, maintain call bell within reach at all times, provide assistance with transfer out of bed and ambulation.  

## 2016-07-29 DIAGNOSIS — G35 Multiple sclerosis: Secondary | ICD-10-CM | POA: Diagnosis not present

## 2016-07-29 DIAGNOSIS — R32 Unspecified urinary incontinence: Secondary | ICD-10-CM | POA: Diagnosis not present

## 2016-07-29 DIAGNOSIS — G40909 Epilepsy, unspecified, not intractable, without status epilepticus: Secondary | ICD-10-CM | POA: Diagnosis not present

## 2016-07-29 DIAGNOSIS — I890 Lymphedema, not elsewhere classified: Secondary | ICD-10-CM | POA: Diagnosis not present

## 2016-07-29 DIAGNOSIS — M4802 Spinal stenosis, cervical region: Secondary | ICD-10-CM | POA: Diagnosis not present

## 2016-07-29 DIAGNOSIS — K589 Irritable bowel syndrome without diarrhea: Secondary | ICD-10-CM | POA: Diagnosis not present

## 2016-08-01 ENCOUNTER — Ambulatory Visit (INDEPENDENT_AMBULATORY_CARE_PROVIDER_SITE_OTHER): Payer: Medicare Other | Admitting: Diagnostic Neuroimaging

## 2016-08-01 ENCOUNTER — Encounter: Payer: Self-pay | Admitting: Diagnostic Neuroimaging

## 2016-08-01 VITALS — BP 118/74 | HR 74

## 2016-08-01 DIAGNOSIS — R5383 Other fatigue: Secondary | ICD-10-CM

## 2016-08-01 DIAGNOSIS — M62838 Other muscle spasm: Secondary | ICD-10-CM

## 2016-08-01 DIAGNOSIS — M79605 Pain in left leg: Secondary | ICD-10-CM

## 2016-08-01 DIAGNOSIS — G40909 Epilepsy, unspecified, not intractable, without status epilepticus: Secondary | ICD-10-CM

## 2016-08-01 DIAGNOSIS — G35 Multiple sclerosis: Secondary | ICD-10-CM | POA: Diagnosis not present

## 2016-08-01 DIAGNOSIS — M79604 Pain in right leg: Secondary | ICD-10-CM | POA: Insufficient documentation

## 2016-08-01 LAB — COMPREHENSIVE METABOLIC PANEL
ALT: 22 IU/L (ref 0–32)
AST: 21 IU/L (ref 0–40)
Albumin/Globulin Ratio: 1.6 (ref 1.2–2.2)
Albumin: 4.3 g/dL (ref 3.5–5.5)
Alkaline Phosphatase: 84 IU/L (ref 39–117)
BILIRUBIN TOTAL: 0.3 mg/dL (ref 0.0–1.2)
BUN/Creatinine Ratio: 22 (ref 9–23)
BUN: 12 mg/dL (ref 6–24)
CO2: 21 mmol/L (ref 20–29)
Calcium: 9.5 mg/dL (ref 8.7–10.2)
Chloride: 104 mmol/L (ref 96–106)
Creatinine, Ser: 0.55 mg/dL — ABNORMAL LOW (ref 0.57–1.00)
GFR calc Af Amer: 121 mL/min/{1.73_m2} (ref >59)
GFR calc non Af Amer: 105 mL/min/{1.73_m2} (ref >59)
GLUCOSE: 95 mg/dL (ref 65–99)
Globulin, Total: 2.7 g/dL (ref 1.5–4.5)
POTASSIUM: 4.5 mmol/L (ref 3.5–5.2)
Sodium: 143 mmol/L (ref 134–144)
Total Protein: 7 g/dL (ref 6.0–8.5)

## 2016-08-01 LAB — DRUG SCREEN 10 W/CONF, SERUM
AMPHETAMINES, IA: NEGATIVE ng/mL
BARBITURATES, IA: NEGATIVE ug/mL
Benzodiazepines, IA: NEGATIVE ng/mL
Cocaine & Metabolite, IA: NEGATIVE ng/mL
METHADONE, IA: NEGATIVE ng/mL
Opiates, IA: NEGATIVE ng/mL
Oxycodones, IA: NEGATIVE ng/mL
Phencyclidine, IA: NEGATIVE ng/mL
Propoxyphene, IA: NEGATIVE ng/mL
THC(MARIJUANA) METABOLITE, IA: NEGATIVE ng/mL

## 2016-08-01 LAB — SEDIMENTATION RATE: Sed Rate: 13 mm/hr (ref 0–40)

## 2016-08-01 LAB — 25-HYDROXY VITAMIN D LCMS D2+D3
25-Hydroxy, Vitamin D-2: <1 ng/mL
25-Hydroxy, Vitamin D: 45 ng/mL

## 2016-08-01 LAB — MAGNESIUM: Magnesium: 1.9 mg/dL (ref 1.6–2.3)

## 2016-08-01 LAB — 25-HYDROXYVITAMIN D LCMS D2+D3: 25-HYDROXY, VITAMIN D-3: 45 ng/mL

## 2016-08-01 LAB — C-REACTIVE PROTEIN: CRP: <0.3 mg/L (ref 0.0–4.9)

## 2016-08-01 LAB — VITAMIN B12: Vitamin B-12: 599 pg/mL (ref 232–1245)

## 2016-08-01 MED ORDER — LEVETIRACETAM 500 MG PO TABS: 500.0000 mg | ORAL_TABLET | Freq: Two times a day (BID) | ORAL | 4 refills | Status: DC

## 2016-08-01 MED ORDER — BACLOFEN 20 MG PO TABS: 20.0000 mg | ORAL_TABLET | Freq: Three times a day (TID) | ORAL | 4 refills | Status: DC

## 2016-08-01 MED ORDER — AMANTADINE HCL 100 MG PO CAPS: 100.0000 mg | ORAL_CAPSULE | Freq: Three times a day (TID) | ORAL | 4 refills | Status: DC

## 2016-08-01 NOTE — Progress Notes (Signed)
Results were reviewed and found to be: mildly abnormal  No acute injury or pathology identified  Review would suggest interventional pain management techniques may be of benefit 

## 2016-08-01 NOTE — Progress Notes (Signed)
[facility]  [phone] [phone]  ID: Rebecca [name] OB: 1960/11/09  MR#: [id number]  LKG#:[id number]  Patient Care Team: [name], MD as PCP - General (Family Medicine) Bobetta Lime, MD as Referring Physician Byrnett, Forest Gleason, MD (General Surgery) Penni Bombard, MD as Consulting Physician (Neurology) Lloyd Huger, MD as Consulting Physician (Oncology) Leonie Man, MD as Consulting Physician (Cardiology) Minna Merritts, MD as Consulting Physician (Cardiology)  CHIEF COMPLAINT:  Chief Complaint  Patient presents with  . Breast Cancer    INTERVAL HISTORY: Patient returns to clinic today for routine evaluation. She currently feels well and is asymptomatic. Pain is not controlled. She states she was referred to pain clinic and had her first appointment but was not given any medications. They are suppose to be calling her back. Patient states her peripheral neuropathy continues to improve. Her lower extremity edema is also significantly improved with the placement of UNA boots by the wound RN's. Headaches have greatly improved with stopping drinking coffee. She denies any chest pain or shortness of breath. She denies any nausea, vomiting, constipation, or diarrhea. She continues to complain of urinary urgency, no other urinary complaints. Patient offers no further specific complaints.   REVIEW OF SYSTEMS:   Review of Systems  Constitutional: Negative for chills, fever and malaise/fatigue.  HENT: Negative.   Eyes: Negative.   Respiratory: Negative for cough and shortness of breath.   Cardiovascular: Negative.  Negative for chest pain and leg swelling.  Gastrointestinal: Negative for abdominal pain, constipation, diarrhea, nausea and vomiting.  Genitourinary: Positive for urgency.  Skin: Negative.   Neurological: Negative.  Negative for weakness and headaches.  Endo/Heme/Allergies: Negative.     Psychiatric/Behavioral: Negative.  The patient is not nervous/anxious.     As per HPI. Otherwise, a complete review of systems is negative.  PAST MEDICAL HISTORY: Past Medical History:  Diagnosis Date  . Aortic valve disease    Mild AS / AI - most recent Echo demonstrated tricuspid aortic valve.  . Bacterial endocarditis    History of .  Marland Kitchen Bilateral lower extremity edema    Noncardiac.  Chronic. LE Venous dopplers - negative for DVT.; Echocardiogram January 2016: Normal EF with normal wall motion and valve function. Only grade 1 diastolic dysfunction. EF 60-65%. Mild MR  . Breast cancer ([place]) 12-31-13   Right breast, 12:00, 1.5 cm, T1c,N0 invasive mammary carcinoma, triple negative. --> Rx with Chemo  . Cervical stenosis of spine   . Herpes zoster   . IBS (irritable bowel syndrome)   . Lymphedema    has legs wrapped at [facility]  . Multiple sclerosis ([place]) 2001   Walks from room to room @ home; but Wheelchair when going out.  Marland Kitchen Neuromuscular disorder ([place])    MS  . Seizures ([place])    Takes Keppra  . Syncope and collapse     PAST SURGICAL HISTORY: Past Surgical History:  Procedure Laterality Date  . ANKLE SURGERY     Left  . ANKLE SURGERY    . ANTERIOR CERVICAL DECOMP/DISCECTOMY FUSION  11/17/2011   Procedure: ANTERIOR CERVICAL DECOMPRESSION/DISCECTOMY FUSION 2 LEVELS;  Surgeon: Erline Levine, MD;  Location: [facility];  Service: Neurosurgery;  Laterality: N/A;  Cervical Five-Six Six-Seven Anterior cervical decompression/diskectomy/fusion  . BREAST BIOPSY Right 12-31-13   invasive mammary  . BREAST SURGERY Right 02/03/2014   Right simple mastectomy with sentinel node biopsy.  . CHOLECYSTECTOMY    . COLONOSCOPY  2014  .  Lower extremity venous Dopplers  Feb 27, 2013   No LE DVT  . MASTECTOMY    . Port a cath insertion Right 01/19/2010  . PORT-A-CATH REMOVAL     right  . PORT-A-CATH REMOVAL Right 09/03/2013   Procedure: REMOVAL PORT-A-CATH;  Surgeon: Conrad Louisa, MD;   Location: [facility];  Service: Vascular;  Laterality: Right;  . TRANSTHORACIC ECHOCARDIOGRAM  03/2013; 02/2014   a) Normal LV size and function with EF 60-65%.; Cannot exclude bicuspid aortic valve with mild AS and mild AI.; b) Normal EF with normal wall motion and valve function x Mild MR. G2 DD. EF 60-65%. Tricuspid AoV  . UPPER GI ENDOSCOPY  2014    FAMILY HISTORY Family History  Problem Relation Age of Onset  . Cancer Father        skin  . Heart disease Father   . Heart attack Father        heart attack in his 41's  . Thyroid disease Sister   . Ovarian cancer Cousin   . Breast cancer Maternal Aunt 60  . Breast cancer Maternal Grandmother 74       ADVANCED DIRECTIVES:    HEALTH MAINTENANCE: Social History  Substance Use Topics  . Smoking status: Never Smoker  . Smokeless tobacco: Never Used  . Alcohol use No     Colonoscopy:  PAP:  Bone density:  Lipid panel:  Allergies  Allergen Reactions  . Fentanyl Nausea And Vomiting and Nausea Only    vomiting Was given in PACU x3 each time patient got sick vomiting Was given in PACU x3 each time patient got sick  . Sulfa Antibiotics Hives and Other (See Comments)    Light headed, over heated    Current Outpatient Prescriptions  Medication Sig Dispense Refill  . amantadine (SYMMETREL) 100 MG capsule Take 1 capsule (100 mg total) by mouth 3 (three) times daily. 270 capsule 4  . baclofen (LIORESAL) 20 MG tablet Take 1 tablet (20 mg total) by mouth 3 (three) times daily. 270 each 4  . fentaNYL (DURAGESIC - DOSED MCG/HR) 50 MCG/HR Place 1 patch (50 mcg total) onto the skin every 3 (three) days. 10 patch 0  . glucosamine-chondroitin 500-400 MG tablet Take 1 tablet by mouth 4 (four) times daily.    Marland Kitchen ibuprofen (ADVIL,MOTRIN) 600 MG tablet TAKE 1 TABLET (600 MG TOTAL) BY MOUTH EVERY 6 (SIX) HOURS AS NEEDED.  2  . Incontinence Supply Disposable (PREVAIL WET WIPES) MISC Apply 1 each topically daily as needed. (dx neurogenic bladder  N31.9, LON 99 months) 96 each 11  . Interferon Beta-1a (REBIF REBIDOSE) 44 MCG/0.5ML SOAJ Inject 0.5 mLs into the skin 3 (three) times a week. (Patient taking differently: Inject 0.5 mLs into the skin 3 (three) times a week. Monday, Wednesday and Friday) 18 mL 4  . levETIRAcetam (KEPPRA) 500 MG tablet Take 1 tablet (500 mg total) by mouth 2 (two) times daily. 180 tablet 4  . Multiple Vitamins-Minerals (MULTIVITAMIN PO) Take 1 tablet by mouth 2 (two) times daily.     Marland Kitchen oxybutynin (DITROPAN-XL) 10 MG 24 hr tablet TAKE 2 TABLETS BY MOUTH DAILY 180 tablet 3  . oxyCODONE (OXY IR/ROXICODONE) 5 MG immediate release tablet Take 1 tablet (5 mg total) by mouth every 6 (six) hours as needed for moderate pain or severe pain. 60 tablet 0  . prochlorperazine (COMPAZINE) 10 MG tablet TAKE 1 TABLET (10 MG TOTAL) BY MOUTH EVERY 6 (SIX) HOURS AS NEEDED.  5  No current facility-administered medications for this visit.     OBJECTIVE: Vitals:   08/02/16 1109  BP: 130/83  Pulse: 67  Resp: 20  Temp: (!) 96.2 F (35.7 C)     There is no height or weight on file to calculate BMI.    ECOG FS:2 - Symptomatic, <50% confined to bed  General: Well-developed, well-nourished, no acute distress. Sitting in a wheelchair. Eyes: anicteric sclera. Breasts: Patient requested exam be deferred today. Lungs: Clear to auscultation bilaterally. Heart: Regular rate and rhythm. No rubs, murmurs, or gallops. Abdomen: Soft, nontender, nondistended. No organomegaly noted, normoactive bowel sounds. Musculoskeletal: Bilateral lower extremity lymphedema. Neuro: Alert, answering all questions appropriately. Cranial nerves grossly intact. Skin: No rashes or petechiae noted. Psych: Normal affect.   LAB RESULTS:  Lab Results  Component Value Date   NA 143 07/28/2016   K 4.5 07/28/2016   CL 104 07/28/2016   CO2 21 07/28/2016   GLUCOSE 95 07/28/2016   BUN 12 07/28/2016   CREATININE 0.55 (L) 07/28/2016   CALCIUM 9.5 07/28/2016     PROT 7.0 07/28/2016   ALBUMIN 4.3 07/28/2016   AST 21 07/28/2016   ALT 22 07/28/2016   ALKPHOS 84 07/28/2016   BILITOT 0.3 07/28/2016   GFRNONAA 105 07/28/2016   GFRAA 121 07/28/2016    Lab Results  Component Value Date   WBC 4.7 06/27/2016   NEUTROABS 3.3 06/27/2016   HGB 12.4 06/27/2016   HCT 37.9 06/27/2016   MCV 92 06/27/2016   PLT 163 06/27/2016   Lab Results  Component Value Date   LABCA2 17.9 02/02/2016     STUDIES: Dg Cervical Spine Complete  Result Date: 07/28/2016 CLINICAL DATA:  Chronic neck pain.  Prior surgery.  No injury . EXAM: CERVICAL SPINE - COMPLETE 4+ VIEW COMPARISON:  MRI 05/14/2016. FINDINGS: C5 through C7 anterior interbody fusion. Hardware intact. Diffuse osteopenia degenerative change. No acute bony abnormality. Port catheter noted. IMPRESSION: 1. C5 through C7 anterior interbody fusion. Hardware intact. Anatomic alignment. 2. Diffuse osteopenia and degenerative change. Electronically Signed   By: Marcello Moores  Register   On: 07/28/2016 15:48   Mm Screening Breast Tomo Uni L  Result Date: 07/20/2016 CLINICAL DATA:  Screening. EXAM: 2D DIGITAL SCREENING UNILATERAL LEFT MAMMOGRAM WITH CAD AND ADJUNCT TOMO COMPARISON:  Previous exam(s). ACR Breast Density Category c: The breast tissue is heterogeneously dense, which may obscure small masses. FINDINGS: Limited evaluation secondary to the patient being in a wheelchair. The best possible images were obtained. The patient has had a right mastectomy. There are no findings suspicious for malignancy. Images were processed with CAD. IMPRESSION: No mammographic evidence of malignancy. A result letter of this screening mammogram will be mailed directly to the patient. RECOMMENDATION: Screening mammogram in one year.  (Code:SM-L-10M) BI-RADS CATEGORY  1: Negative. Electronically Signed   By: Pamelia Hoit M.D.   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 BRCA  negative.  PLAN:    1. Triple negative right breast cancer: No evidence of disease. CA-27-29 continues to be within normal limits. Because patient had a full mastectomy, she did not require adjuvant XRT. Given the fact that she is a triple negative cancer, she will not require an aromatase inhibitor. Given her difficulties with Taxol and multiple sclerosis, treatment was discontinued after a total of 6 of 12 cycles of Taxol. Her last chemotherapy was July 17, 2014. Mammogram of her left breast on 07/20/16 was reported as BIRADS  1, repeat in one year.  Return to clinic in 6 months for laboratory work and further evaluation.  2. Multiple sclerosis: Continue evaluation and current treatment per neurology. 3. Lymphedema: Continue treatment and wraps as prescribed. 4. Peripheral neuropathy: Improved. Monitor. 5. Pain: Pain clinic referral. Continue fentanyl patch to 50 g and 5 mg oxycodone as needed for breakthrough pain. Continue to see the pain clinic. She is currently waiting on a follow-up appointment from the pain clinic.  6. Nausea: Resolved.  7. Headache: Resolved. 8. UTI: Resolved. Still has urgency but states that is her normal.   Her primary neurologist is Dr. Andrey Spearman.  phone [phone], fax [phone].   Patient expressed understanding and was in agreement with this plan. She also understands that She can call clinic at any time with any questions, concerns, or complaints.   Breast cancer   Staging form: Breast, AJCC 7th Edition     Pathologic stage from 05/24/2014: Stage IA (T1c, N0, cM0) - Signed by Lloyd Huger, MD on 05/24/2014  Faythe Casa, NP  Patient was seen and evaluated independently and I agree with the assessment and plan as dictated above. Patient also expressed understanding that this clinic will be minimizing her narcotic prescriptions and transferring treatment to the pain clinic or her primary neurologist.  Lloyd Huger, MD 08/05/16 4:19 PM

## 2016-08-01 NOTE — Progress Notes (Signed)
[facility]  PATIENT: Rebecca [name] DOB: 1960-08-17  REFERRING CLINICIAN:  HISTORY FROM: patient, husband REASON FOR VISIT: follow up   HISTORICAL  CHIEF COMPLAINT:  Chief Complaint  Patient presents with  . Multiple Sclerosis    rm 6, husband- Don, "hospitalized for MS exacerbation, rehab x 4 weeks; not doing as well now-at home"  . Follow-up    6 month    HISTORY OF PRESENT ILLNESS:   UPDATE 08/01/16: Since last visit, was admitted to hospital in April 2018 for UTI and generalized weakness. MRI brain, c and t spine were neg for new or acute plaques.   UPDATE 11/30/15: Since last visit, doing well. HA are improved. MS stable. Baclofen, LEV, amantadine, rebif working. No seizures.   UPDATE 08/06/15: Since last visit, overall stable. MS sxs are stable. However having new headaches (severe, 10 days out of last 1 month, eyes, no nausea/vomiting, positive phonophobia). Never had these kind of headaches before. Headaches are better in last few days.   UPDATE 03/23/15: Since last visit, doing well. Back on rebif. Ready to try PT evaluation again. Tolerating meds.   UPDATE 09/16/14: Since last visit, completed breast CA treatments (adriamycin, cytoxan, taxol; couldn't complete the taxol cycles). Now ready for restarting MS treatments. No focal flares of MS, but had generalized slowness, fatigue, muscle spasms.   UPDATE 02/13/14: Since last visit, has been diagnosed with triple negative breast CA, s/p right mastectomy. Now planning to have chemotherapy. No new neuro symptoms. Tolerating rebif.   UPDATE 11/13/13: Since last visit, doing well. Getting wrap / lymphedema therapy via Amy (OT) at [facility] with great results. Significant improvement. Tolerating rebif. No new lesions or symptoms.  UPDATE 08/14/13: Since last visit, has continued on rebif. BLE edema still a problem. Saw cardiology, tried diuretic, and also trying wrap/massage therapy next week. Walking and mobility  gradually worsening.   UPDATE 02/13/13 (LL): The patient returns for followup visit. Since last visit she states that she has seen Dr. Katy Fitch and that she needs new prescription eyeglasses for vision problems that the double vision resolved. She is tolerating rebif. Her biggest concern is that her bilateral leg edema is making it difficult for her to move her legs or ambulate. She is not consistently wearing her TED hose. She denies any shortness of breath, but is mostly wheelchair-bound. She states that she feels like she is progressively getting weaker. She asked for information about Tecfidera. She said she had a cardiology consultation in the past when she started Gilenya, but that Dr. has since retired. She states she was diagnosed with aortic insufficiency. Her urgency and frequency of urination is getting worse, she asked if she can be increased on her medication.   UPDATE 11/07/12: Since last visit, doing well, until 1 month ago and developed fluctuating double vision/blurred vision. Affects right greater than left eye. It is present even if she closes the right or left eyes.   UPDATE 04/20/12: Since last visit, has come off gabapentin. BLE swelling stable. S/p ACDF surg per Dr. Vertell Limber and doing well. Tolerating rebif. Has new PCP and also getting PT. Few days ago, developed some numbness in right hand, similar to pre-op symptoms which improved post-op. No neck pain.   UPDATE 01/10/12: Noticed worsening swelling to bilateral lower extremities since 01/04/12. She is also having throbbing pain to shins. Difficulty with wearing sneakers. Unable to use leg compression machine. Denies shortness of breath.   UPDATE 10/27/11: Having surgery on c-spine fusion  on November 17, 2011. Concerned about recovery time and her ability to perform ADL's. Lives with aunt. Wanting to know if she needs to have someone come to home or stay at rehab. Intermittent blurred vision las 3 weeks.   UPDATE 07/27/11: Doing better with  shingles and post-herpetic neuralgia pain; on gralise. Now with worsening RLE weakness, fatigue, vision changes, speech diff. Discussed tx options.   UPDATE 06/02/11: On 05/27/11 developed right lower tooth pain; 4/20 developed blisters and rash on right chin and jaw. 4/22 went to dentist, diagnosed with tooth infx and given amoxicillin. Rash more significant. 06/01/11, went to dermatologist and dx'd possible shingles. Given valtrex and started on 4/24. Lesions still oozing. Has right ear and right facial pain, severe. No eye pain.   UPDATE 03/02/10: Patient was getting worked up for first dose observation for Gilenya, however on 02/28/2011, patient had a suspected seizure. She was in the bathroom at home, stood up and without warning have loss of consciousness. Her mother found her on the floor groaning with some twitching movements of her upper extremities, and her legs outstretched in front of her. No incontinence or tongue biting. It took approximately 20-30 minutes for her to wake up. She was amnestic events until the next day.  In retrospect, patient had 4 or 5 episodes of syncope versus seizure in the early 1980s. She was never on antiseizure medication. Patient was also on Ampyra since past 2 years.   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 Last dose tysabri on 10/29/10. She would like to switch to [place]. No MS flares. Some increased fatigue. Also asking if amantadine can be stopped to see if peripheral edema improves.   PRIOR HPI (11/02/10): 56 year old right-handed female with history of multiple sclerosis. Here for transfer of care from [place]. 2001 - patient developed left leg numbness and weakness, followed by right facial weakness and slurred speech. She was evaluated with MRI of the brain, diagnosed with multiple sclerosis and started on Copaxone. She did not have lumbar puncture or spinal cord MRI. 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 She thinks she was off of Copaxone for approximately 2 years. 2008-9 - patient was evaluated at [facility] and started on Tysabri. She thinks she has been on Tysabri for approximately 3-1/2 years. Last dose was last week at [facility]. She continues to decline in terms of mobility. She is to walk using a cane, then started using a walker, now uses a transport chair occasionally. Patient continues to have intermittent right facial twitching and numbness. She is on amantadine, baclofen, Amprya, and oxybutynin for MS symptoms.    REVIEW OF SYSTEMS: Full 14 system review of systems performed and negative except:joint swelling aching muscles cramps numbness fatigue leg swelling murmur.    ALLERGIES: Allergies  Allergen Reactions  . Fentanyl Nausea And Vomiting and Nausea Only    vomiting Was given in PACU x3 each time patient got sick vomiting Was given in PACU x3 each time patient got sick  . Sulfa Antibiotics Hives and Other (See Comments)    Light headed, over heated    HOME MEDICATIONS: Outpatient Medications Prior to Visit  Medication Sig Dispense Refill  . amantadine (SYMMETREL) 100 MG capsule Take 1 capsule (100 mg total) by mouth 3 (three) times daily. 9 capsule 12  . baclofen (LIORESAL) 20 MG tablet Take 1 tablet (20 mg total) by mouth 3 (three) times daily. 6 each 0  .  fentaNYL (DURAGESIC - DOSED MCG/HR) 50 MCG/HR Place 1 patch (50 mcg total) onto the skin every 3 (three) days. 10 patch 0  . glucosamine-chondroitin 500-400 MG tablet Take 1 tablet by mouth 4 (four) times daily.    Marland Kitchen ibuprofen (ADVIL,MOTRIN) 600 MG tablet TAKE 1 TABLET (600 MG TOTAL) BY MOUTH EVERY 6 (SIX) HOURS AS NEEDED.  2  . Incontinence Supply Disposable (PREVAIL WET WIPES) MISC Apply 1 each topically daily as needed. (dx neurogenic bladder N31.9, LON 99 months) 96 each 11  . Interferon Beta-1a (REBIF REBIDOSE) 44 MCG/0.5ML SOAJ Inject 0.5 mLs into the skin 3 (three) times a week. (Patient taking  differently: Inject 0.5 mLs into the skin 3 (three) times a week. Monday, Wednesday and Friday) 18 mL 4  . levETIRAcetam (KEPPRA) 500 MG tablet Take 1 tablet (500 mg total) by mouth 2 (two) times daily. 180 tablet 4  . Multiple Vitamins-Minerals (MULTIVITAMIN PO) Take 1 tablet by mouth 2 (two) times daily.     Marland Kitchen oxybutynin (DITROPAN-XL) 10 MG 24 hr tablet TAKE 2 TABLETS BY MOUTH DAILY 180 tablet 3  . oxyCODONE (OXY IR/ROXICODONE) 5 MG immediate release tablet Take 1 tablet (5 mg total) by mouth every 6 (six) hours as needed for moderate pain or severe pain. 60 tablet 0  . prochlorperazine (COMPAZINE) 10 MG tablet TAKE 1 TABLET (10 MG TOTAL) BY MOUTH EVERY 6 (SIX) HOURS AS NEEDED.  5   No facility-administered medications prior to visit.     PAST MEDICAL HISTORY: Past Medical History:  Diagnosis Date  . Aortic valve disease    Mild AS / AI - most recent Echo demonstrated tricuspid aortic valve.  . Bacterial endocarditis    History of .  Marland Kitchen Bilateral lower extremity edema    Noncardiac.  Chronic. LE Venous dopplers - negative for DVT.; Echocardiogram January 2016: Normal EF with normal wall motion and valve function. Only grade 1 diastolic dysfunction. EF 60-65%. Mild MR  . Breast cancer ([place]) 12-31-13   Right breast, 12:00, 1.5 cm, T1c,N0 invasive mammary carcinoma, triple negative. --> Rx with Chemo  . Cervical stenosis of spine   . Herpes zoster   . IBS (irritable bowel syndrome)   . Lymphedema    has legs [facility]  . Multiple sclerosis ([place]) 2001   Walks from room to room @ home; but Wheelchair when going out.  Marland Kitchen Neuromuscular disorder ([place])    MS  . Seizures ([place])    Takes Keppra  . Syncope and collapse     PAST SURGICAL HISTORY: Past Surgical History:  Procedure Laterality Date  . ANKLE SURGERY     Left  . ANKLE SURGERY    . ANTERIOR CERVICAL DECOMP/DISCECTOMY FUSION  11/17/2011   Procedure: ANTERIOR CERVICAL DECOMPRESSION/DISCECTOMY FUSION 2 LEVELS;  Surgeon:  Erline Levine, MD;  Location: [place] [facility];  Service: Neurosurgery;  Laterality: N/A;  Cervical Five-Six Six-Seven Anterior cervical decompression/diskectomy/fusion  . BREAST BIOPSY Right 12-31-13   invasive mammary  . BREAST SURGERY Right 02/03/2014   Right simple mastectomy with sentinel node biopsy.  . CHOLECYSTECTOMY    . COLONOSCOPY  2014  . Lower extremity venous Dopplers  Feb 27, 2013   No LE DVT  . MASTECTOMY    . Port a cath insertion Right 01/19/2010  . PORT-A-CATH REMOVAL     right  . PORT-A-CATH REMOVAL Right 09/03/2013   Procedure: REMOVAL PORT-A-CATH;  Surgeon: Conrad [place], MD;  Location: [place];  Service: Vascular;  Laterality:  Right;  Marland Kitchen TRANSTHORACIC ECHOCARDIOGRAM  03/2013; 02/2014   a) Normal LV size and function with EF 60-65%.; Cannot exclude bicuspid aortic valve with mild AS and mild AI.; b) Normal EF with normal wall motion and valve function x Mild MR. G2 DD. EF 60-65%. Tricuspid AoV  . UPPER GI ENDOSCOPY  2014    FAMILY HISTORY: Family History  Problem Relation Age of Onset  . Cancer Father        skin  . Heart disease Father   . Heart attack Father        heart attack in his 5's  . Thyroid disease Sister   . Ovarian cancer Cousin   . Breast cancer Maternal Aunt 60  . Breast cancer Maternal Grandmother 74    SOCIAL HISTORY:  Social History   Social History  . Marital status: Married    Spouse name: don  . Number of children: 0  . Years of education: 12   Occupational History  . disability    Social History Main Topics  . Smoking status: Never Smoker  . Smokeless tobacco: Never Used  . Alcohol use No  . Drug use: No  . Sexual activity: Not Currently    Partners: Male   Other Topics Concern  . Not on file   Social History Narrative   She is married. Recently moved back to [place] after being in [place] for some time. She is accompanied by her husband and aunt.   Never smoked. Never used alcohol.     PHYSICAL EXAM  Vitals:    08/01/16 1348  BP: 118/74  Pulse: 74    Not recorded      There is no height or weight on file to calculate BMI.  GENERAL EXAM:  General: Patient is awake, alert and in no acute distress. Well developed and groomed.  Neck: Neck is supple.  Cardiovascular: Heart is regular rate and rhythm with mild systolic murmur. PERIPHERAL EDEMA IN BLE.   Neurologic Exam  Mental Status: Awake, alert. Language is fluent and comprehension intact. Cranial Nerves: Pupils are equal and reactive to light. Visual fields are full to confrontation. Conjugate eye movements are full and symmetric --> MILD SACCADIC BREAKDOWN OF SMOOTH PURSUIT. Facial sensation and strength are symmetric. Hearing is intact. Palate elevated symmetrically and uvula is midline. Shoulder shrug is symmetric. Tongue is midline.  Motor: Normal bulk and tone. Full strength in the upper extremities; BLE (1-2/5 PROX, 3/5 DISTAL), INCREASED TONE IN BLE. No pronator drift.  Sensory: Intact and symmetric to light touch.  Coordination: FTN SMOOTH Reflexes: Deep tendon reflexes in the upper and lower extremity are present and symmetric; TRACE AT KNEES AND ANKLES. MILD PERIPHERAL EDEMA. Gait and Station: [place].     DIAGNOSTIC DATA (LABS, IMAGING, TESTING) - I reviewed patient records, labs, notes, testing and imaging myself where available.  Lab Results  Component Value Date   WBC 4.7 06/27/2016   HGB 12.4 06/27/2016   HCT 37.9 06/27/2016   MCV 92 06/27/2016   PLT 163 06/27/2016      Component Value Date/Time   NA 143 07/28/2016 1520   NA 139 06/05/2014 0951   K 4.5 07/28/2016 1520   K 3.8 06/05/2014 0951   CL 104 07/28/2016 1520   CL 107 06/05/2014 0951   CO2 21 07/28/2016 1520   CO2 28 06/05/2014 0951   GLUCOSE 95 07/28/2016 1520   GLUCOSE 103 (H) 06/02/2016 0427   GLUCOSE 130 (H) 06/05/2014 9833  BUN 12 07/28/2016 1520   BUN 12 06/05/2014 0951   CREATININE 0.55 (L) 07/28/2016 1520   CREATININE 0.49 (L)  11/25/2015 1216   CALCIUM 9.5 07/28/2016 1520   CALCIUM 9.4 06/05/2014 0951   PROT 7.0 07/28/2016 1520   PROT 7.0 05/29/2014 0949   ALBUMIN 4.3 07/28/2016 1520   ALBUMIN 4.1 05/29/2014 0949   AST 21 07/28/2016 1520   AST 24 05/29/2014 0949   ALT 22 07/28/2016 1520   ALT 24 05/29/2014 0949   ALKPHOS 84 07/28/2016 1520   ALKPHOS 58 05/29/2014 0949   BILITOT 0.3 07/28/2016 1520   BILITOT 0.6 05/29/2014 0949   GFRNONAA 105 07/28/2016 1520   GFRNONAA >89 11/25/2015 1216   GFRAA 121 07/28/2016 1520   GFRAA >89 11/25/2015 1216   Lab Results  Component Value Date   CHOL 214 (H) 11/25/2015   No results found for: HGBA1C Lab Results  Component Value Date   VITAMINB12 599 07/28/2016   Lab Results  Component Value Date   TSH 1.83 11/25/2015     08/21/13 MRI brain (with and without) demonstrating:  1. Multiple periventricular and subcortical chronic demyelinating plaques. Some of these are confluent. Some are hypointense on T1.  2. No acute plaques.  3. No significant change from MRI on 11/20/12.   08/21/13 MRI cervical spine (with and without) demonstrating:  1. At C2-3: disc bulging and facet hypertrophy with mild spinal stenosis and mild left foraminal stenosis.  2. At C5-6: uncovertebral joint hypertrophy with mild right foraminal stenosis.  3. Multiple chronic demyelinating plaques from C2 to T1. No abnormal enhancing lesions.  4. Compared to MRI from 10/04/11, there has been progression of degenerative spine disease at C2-3. Also there has been interval ACDF from C5-C7. No significant change in spinal cord lesions.  08/21/13 MRI thoracic spine (with and without) demonstrating:  1. Multiple spinal cord chronic demyelinating plaques from C7 to T12-L1.  2. No acute plaques.  3. ACDF at C5-C7.  4. Compared to prior MRI from 08/03/11, the ACDF is new. Overall no new demyelinating plaques.  09/30/14 MRI brain  - showing 2/flare foci in the right cervical hemisphere, pons, thalami  and cerebral hemispheres in a pattern and configuration consistent with the diagnosis of multiple sclerosis. None of the foci enhances after contrast administration. There are no acute findings. When compared to the MRI dated 08/21/2013, there has been no interval change.  10/07/15 MRI brain  - Moderately severe white matter changes consistent with multiple sclerosis are stable since the prior MRI. - Negative for metastatic disease.  No acute abnormality.  05/14/16 MRI HEAD: [I reviewed images myself and agree with interpretation. -VRP]  - Stable examination: Severe chronic demyelination without new lesions or acute inflammation. No parenchymal brain volume loss for age.  05/14/16 MRI CERVICAL SPINE:  - Moderately motion degraded examination. Chronic demyelination without myelomalacia. No enhancement to suggest acute inflammation. - Status post C5 through C7 ACDF with suspected nonunion C6-7, recommend cervical spine radiographs. - Moderate canal stenosis C4-5 consistent with adjacent segment disease. Mild canal stenosis C2-3 and C3-4.  - Multilevel neural foraminal narrowing: Severe on the RIGHT at C4-5.  05/14/16 MRI THORACIC SPINE:  - Suspected chronic demyelination of the upper thoracic spinal cord though, limited assessment due to patient motion. No enhancement to suggest acute inflammation. - Early degenerative change of thoracic spine without canal stenosis or neural foraminal narrowing.  07/28/16 cervical spine xray  1. C5 through C7 anterior interbody fusion. Hardware intact. Anatomic  alignment. 2. Diffuse osteopenia and degenerative change.     ASSESSMENT AND PLAN  56 y.o. year old female here with multiple sclerosis, initially on copaxone, then was on Tysabri in the past (3-1/2 years); then became JCV +. Last dose tysabri 10/29/10. Switched to [place] on 04/25/11.   Had brief seizure before starting gilenya, now on levetiracetam.   Then with right V2, V3 herpes zoster on 05/27/11.  Gilenya stopped. Shingles resolved.  Then on rebif. Also s/p ACDF C5-6 (Oct 2013) for spinal stenosis.   Now with breast CA diagnosis, s/p right mastectomy, s/p adriamycin, cytoxan, taxol. Now treatments have been completed.   Now back on rebif (since Jan 2017).  Now with new onset headaches (June 2017). MRI brain unremarkable. HA now improving.    Dx:  Multiple sclerosis ([place])  Seizure disorder ([place])  Other fatigue  Muscle spasm     PLAN:   I spent 25 minutes of face to face time with patient. Greater than 50% of time was spent in counseling and coordination of care with patient. In summary we discussed:   MULTIPLE SCLEROSIS (established problem, stable) - continue rebif  MUSCLE SPASMS - continue baclofen for muscle spasms (stable)  MS fatigue - continue amantadine for fatigue (stable)  SEIZURE DISORDER - continue levetiracetam for seizure d/o (stable)  Meds ordered this encounter  Medications  . levETIRAcetam (KEPPRA) 500 MG tablet    Sig: Take 1 tablet (500 mg total) by mouth 2 (two) times daily.    Dispense:  180 tablet    Refill:  4  . baclofen (LIORESAL) 20 MG tablet    Sig: Take 1 tablet (20 mg total) by mouth 3 (three) times daily.    Dispense:  270 each    Refill:  4  . amantadine (SYMMETREL) 100 MG capsule    Sig: Take 1 capsule (100 mg total) by mouth 3 (three) times daily.    Dispense:  270 capsule    Refill:  4   Return in about 6 months (around 01/31/2017).    Penni Bombard, MD 0/93/1121, 6:24 PM Certified in Neurology, Neurophysiology and Neuroimaging  [facility] [address] [place], [place] [address] [phone]

## 2016-08-02 ENCOUNTER — Inpatient Hospital Stay: Payer: Medicare Other

## 2016-08-02 ENCOUNTER — Inpatient Hospital Stay: Payer: Medicare Other | Attending: Oncology

## 2016-08-02 ENCOUNTER — Inpatient Hospital Stay (HOSPITAL_BASED_OUTPATIENT_CLINIC_OR_DEPARTMENT_OTHER): Payer: Medicare Other | Admitting: Oncology

## 2016-08-02 VITALS — BP 130/83 | HR 67 | Temp 96.2°F | Resp 20

## 2016-08-02 DIAGNOSIS — G629 Polyneuropathy, unspecified: Secondary | ICD-10-CM | POA: Diagnosis not present

## 2016-08-02 DIAGNOSIS — Z9221 Personal history of antineoplastic chemotherapy: Secondary | ICD-10-CM

## 2016-08-02 DIAGNOSIS — G35 Multiple sclerosis: Secondary | ICD-10-CM | POA: Diagnosis not present

## 2016-08-02 DIAGNOSIS — K589 Irritable bowel syndrome without diarrhea: Secondary | ICD-10-CM | POA: Diagnosis not present

## 2016-08-02 DIAGNOSIS — C50911 Malignant neoplasm of unspecified site of right female breast: Secondary | ICD-10-CM

## 2016-08-02 DIAGNOSIS — Z9011 Acquired absence of right breast and nipple: Secondary | ICD-10-CM | POA: Diagnosis not present

## 2016-08-02 DIAGNOSIS — Z95828 Presence of other vascular implants and grafts: Secondary | ICD-10-CM

## 2016-08-02 DIAGNOSIS — Z171 Estrogen receptor negative status [ER-]: Secondary | ICD-10-CM | POA: Insufficient documentation

## 2016-08-02 DIAGNOSIS — Z79899 Other long term (current) drug therapy: Secondary | ICD-10-CM | POA: Insufficient documentation

## 2016-08-02 DIAGNOSIS — Z853 Personal history of malignant neoplasm of breast: Secondary | ICD-10-CM | POA: Insufficient documentation

## 2016-08-02 MED ORDER — HEPARIN SOD (PORK) LOCK FLUSH 100 UNIT/ML IV SOLN
500.0000 [IU] | Freq: Once | INTRAVENOUS | Status: AC
  Administered 2016-08-02: 500 [IU] via INTRAVENOUS

## 2016-08-02 MED ORDER — SODIUM CHLORIDE 0.9% FLUSH
10.0000 mL | INTRAVENOUS | Status: DC | PRN
  Administered 2016-08-02: 10 mL via INTRAVENOUS
  Filled 2016-08-02: qty 10

## 2016-08-02 NOTE — Progress Notes (Signed)
Patient denies any concerns today.  

## 2016-08-03 DIAGNOSIS — G40909 Epilepsy, unspecified, not intractable, without status epilepticus: Secondary | ICD-10-CM | POA: Diagnosis not present

## 2016-08-03 DIAGNOSIS — I890 Lymphedema, not elsewhere classified: Secondary | ICD-10-CM | POA: Diagnosis not present

## 2016-08-03 DIAGNOSIS — K589 Irritable bowel syndrome without diarrhea: Secondary | ICD-10-CM | POA: Diagnosis not present

## 2016-08-03 DIAGNOSIS — R32 Unspecified urinary incontinence: Secondary | ICD-10-CM | POA: Diagnosis not present

## 2016-08-03 DIAGNOSIS — G35 Multiple sclerosis: Secondary | ICD-10-CM | POA: Diagnosis not present

## 2016-08-03 DIAGNOSIS — M4802 Spinal stenosis, cervical region: Secondary | ICD-10-CM | POA: Diagnosis not present

## 2016-08-03 LAB — CANCER ANTIGEN 27.29: CA 27.29: 18 U/mL (ref 0.0–38.6)

## 2016-08-04 DIAGNOSIS — I890 Lymphedema, not elsewhere classified: Secondary | ICD-10-CM | POA: Diagnosis not present

## 2016-08-04 DIAGNOSIS — G40909 Epilepsy, unspecified, not intractable, without status epilepticus: Secondary | ICD-10-CM | POA: Diagnosis not present

## 2016-08-04 DIAGNOSIS — G35 Multiple sclerosis: Secondary | ICD-10-CM | POA: Diagnosis not present

## 2016-08-04 DIAGNOSIS — K589 Irritable bowel syndrome without diarrhea: Secondary | ICD-10-CM | POA: Diagnosis not present

## 2016-08-04 DIAGNOSIS — R32 Unspecified urinary incontinence: Secondary | ICD-10-CM | POA: Diagnosis not present

## 2016-08-04 DIAGNOSIS — M4802 Spinal stenosis, cervical region: Secondary | ICD-10-CM | POA: Diagnosis not present

## 2016-08-07 NOTE — Progress Notes (Deleted)
Cardiology Office Note  Date:  08/07/2016   ID:  Rebecca [name], DOB [birth year], MRN [id number]  PCP:  [name], MD   No chief complaint on file.   HPI:  Rebecca [name] is a 56 y.o. woman with a PMH  chronic LE edema (mostly related to Lymphedema).   multiple sclerosis with muscle atrophy of lower extremities.  uses Unaboot wraps for mechanical compression. Who presents for follow up of her leg swelling  Echo 03/2016 Normal EF of 60-65% with only grade 1 diastolic dysfunction.  mild aortic and mitral regurgitation and  mildly elevated pulmonary pressures.   breast cancer,  right mastectomy with lymph node dissection.  completed chemotherapy    compression stockings and pressure boots   wheelchair bound. She really denies any cardiac symptoms:    No chest pain or shortness of breath with rest or exertion. No PND, orthopnea.  No palpitations, lightheadedness, dizziness, weakness or syncope/near syncope. No TIA/amaurosis fugax symptoms.   PMH:   has a past medical history of Aortic valve disease; Bacterial endocarditis; Bilateral lower extremity edema; Breast cancer ([place]) (12-31-13); Cervical stenosis of spine; Herpes zoster; IBS (irritable bowel syndrome); Lymphedema; Multiple sclerosis ([place]) (2001); Neuromuscular disorder ([place]); Seizures ([place]); and Syncope and collapse.  PSH:    Past Surgical History:  Procedure Laterality Date  . ANKLE SURGERY     Left  . ANKLE SURGERY    . ANTERIOR CERVICAL DECOMP/DISCECTOMY FUSION  11/17/2011   Procedure: ANTERIOR CERVICAL DECOMPRESSION/DISCECTOMY FUSION 2 LEVELS;  Surgeon: Erline Levine, MD;  Location: [facility];  Service: Neurosurgery;  Laterality: N/A;  Cervical Five-Six Six-Seven Anterior cervical decompression/diskectomy/fusion  . BREAST BIOPSY Right 12-31-13   invasive mammary  . BREAST SURGERY Right 02/03/2014   Right simple mastectomy with sentinel node biopsy.  . CHOLECYSTECTOMY    . COLONOSCOPY  2014  . Lower  extremity venous Dopplers  Feb 27, 2013   No LE DVT  . MASTECTOMY    . Port a cath insertion Right 01/19/2010  . PORT-A-CATH REMOVAL     right  . PORT-A-CATH REMOVAL Right 09/03/2013   Procedure: REMOVAL PORT-A-CATH;  Surgeon: Conrad Grovetown, MD;  Location: [facility];  Service: Vascular;  Laterality: Right;  . TRANSTHORACIC ECHOCARDIOGRAM  03/2013; 02/2014   a) Normal LV size and function with EF 60-65%.; Cannot exclude bicuspid aortic valve with mild AS and mild AI.; b) Normal EF with normal wall motion and valve function x Mild MR. G2 DD. EF 60-65%. Tricuspid AoV  . UPPER GI ENDOSCOPY  2014    Current Outpatient Prescriptions  Medication Sig Dispense Refill  . amantadine (SYMMETREL) 100 MG capsule Take 1 capsule (100 mg total) by mouth 3 (three) times daily. 270 capsule 4  . baclofen (LIORESAL) 20 MG tablet Take 1 tablet (20 mg total) by mouth 3 (three) times daily. 270 each 4  . fentaNYL (DURAGESIC - DOSED MCG/HR) 50 MCG/HR Place 1 patch (50 mcg total) onto the skin every 3 (three) days. 10 patch 0  . glucosamine-chondroitin 500-400 MG tablet Take 1 tablet by mouth 4 (four) times daily.    Marland Kitchen ibuprofen (ADVIL,MOTRIN) 600 MG tablet TAKE 1 TABLET (600 MG TOTAL) BY MOUTH EVERY 6 (SIX) HOURS AS NEEDED.  2  . Incontinence Supply Disposable (PREVAIL WET WIPES) MISC Apply 1 each topically daily as needed. (dx neurogenic bladder N31.9, LON 99 months) 96 each 11  . Interferon Beta-1a (REBIF REBIDOSE) 44 MCG/0.5ML SOAJ Inject 0.5 mLs into the skin 3 (  three) times a week. (Patient taking differently: Inject 0.5 mLs into the skin 3 (three) times a week. Monday, Wednesday and Friday) 18 mL 4  . levETIRAcetam (KEPPRA) 500 MG tablet Take 1 tablet (500 mg total) by mouth 2 (two) times daily. 180 tablet 4  . Multiple Vitamins-Minerals (MULTIVITAMIN PO) Take 1 tablet by mouth 2 (two) times daily.     Marland Kitchen oxybutynin (DITROPAN-XL) 10 MG 24 hr tablet TAKE 2 TABLETS BY MOUTH DAILY 180 tablet 3  . oxyCODONE (OXY  IR/ROXICODONE) 5 MG immediate release tablet Take 1 tablet (5 mg total) by mouth every 6 (six) hours as needed for moderate pain or severe pain. 60 tablet 0  . prochlorperazine (COMPAZINE) 10 MG tablet TAKE 1 TABLET (10 MG TOTAL) BY MOUTH EVERY 6 (SIX) HOURS AS NEEDED.  5   No current facility-administered medications for this visit.      Allergies:   Fentanyl and Sulfa antibiotics   Social History:  The patient  reports that she has never smoked. She has never used smokeless tobacco. She reports that she does not drink alcohol or use drugs.   Family History:   family history includes Breast cancer (age of onset: 61) in her maternal aunt; Breast cancer (age of onset: 75) in her maternal grandmother; Cancer in her father; Heart attack in her father; Heart disease in her father; Ovarian cancer in her cousin; Thyroid disease in her sister.    Review of Systems: ROS   PHYSICAL EXAM: VS:  There were no vitals taken for this visit. , BMI There is no height or weight on file to calculate BMI. GEN: Well nourished, well developed, in no acute distress HEENT: normal Neck: no JVD, carotid bruits, or masses Cardiac: RRR; no murmurs, rubs, or gallops,no edema  Respiratory:  clear to auscultation bilaterally, normal work of breathing GI: soft, nontender, nondistended, + BS MS: no deformity or atrophy Skin: warm and dry, no rash Neuro:  Strength and sensation are intact Psych: euthymic mood, full affect    Recent Labs: 11/25/2015: TSH 1.83 06/27/2016: Hemoglobin 12.4; Platelets 163 07/28/2016: ALT 22; BUN 12; Creatinine, Ser 0.55; Magnesium 1.9; Potassium 4.5; Sodium 143    Lipid Panel Lab Results  Component Value Date   CHOL 214 (H) 11/25/2015   HDL 58 11/25/2015   LDLCALC 134 (H) 11/25/2015   TRIG 109 11/25/2015      Wt Readings from Last 3 Encounters:  07/28/16 170 lb (77.1 kg)  05/20/16 193 lb (87.5 kg)  05/14/16 175 lb (79.4 kg)       ASSESSMENT AND PLAN:  No diagnosis  found.   Disposition:   F/U  6 months  No orders of the defined types were placed in this encounter.    Signed, Esmond Plants, M.D., Ph.D. 08/07/2016  [place], [place]

## 2016-08-08 DIAGNOSIS — G35 Multiple sclerosis: Secondary | ICD-10-CM | POA: Diagnosis not present

## 2016-08-08 DIAGNOSIS — G40909 Epilepsy, unspecified, not intractable, without status epilepticus: Secondary | ICD-10-CM | POA: Diagnosis not present

## 2016-08-08 DIAGNOSIS — K589 Irritable bowel syndrome without diarrhea: Secondary | ICD-10-CM | POA: Diagnosis not present

## 2016-08-08 DIAGNOSIS — R32 Unspecified urinary incontinence: Secondary | ICD-10-CM | POA: Diagnosis not present

## 2016-08-08 DIAGNOSIS — I890 Lymphedema, not elsewhere classified: Secondary | ICD-10-CM | POA: Diagnosis not present

## 2016-08-08 DIAGNOSIS — M4802 Spinal stenosis, cervical region: Secondary | ICD-10-CM | POA: Diagnosis not present

## 2016-08-09 ENCOUNTER — Ambulatory Visit: Payer: Medicare Other | Admitting: Cardiovascular Disease

## 2016-08-10 DIAGNOSIS — M4802 Spinal stenosis, cervical region: Secondary | ICD-10-CM | POA: Diagnosis not present

## 2016-08-10 DIAGNOSIS — G35 Multiple sclerosis: Secondary | ICD-10-CM | POA: Diagnosis not present

## 2016-08-10 DIAGNOSIS — I890 Lymphedema, not elsewhere classified: Secondary | ICD-10-CM | POA: Diagnosis not present

## 2016-08-10 DIAGNOSIS — G40909 Epilepsy, unspecified, not intractable, without status epilepticus: Secondary | ICD-10-CM | POA: Diagnosis not present

## 2016-08-10 DIAGNOSIS — K589 Irritable bowel syndrome without diarrhea: Secondary | ICD-10-CM | POA: Diagnosis not present

## 2016-08-10 DIAGNOSIS — R32 Unspecified urinary incontinence: Secondary | ICD-10-CM | POA: Diagnosis not present

## 2016-08-12 DIAGNOSIS — R32 Unspecified urinary incontinence: Secondary | ICD-10-CM | POA: Diagnosis not present

## 2016-08-12 DIAGNOSIS — I890 Lymphedema, not elsewhere classified: Secondary | ICD-10-CM | POA: Diagnosis not present

## 2016-08-12 DIAGNOSIS — M4802 Spinal stenosis, cervical region: Secondary | ICD-10-CM | POA: Diagnosis not present

## 2016-08-12 DIAGNOSIS — G40909 Epilepsy, unspecified, not intractable, without status epilepticus: Secondary | ICD-10-CM | POA: Diagnosis not present

## 2016-08-12 DIAGNOSIS — K589 Irritable bowel syndrome without diarrhea: Secondary | ICD-10-CM | POA: Diagnosis not present

## 2016-08-12 DIAGNOSIS — G35 Multiple sclerosis: Secondary | ICD-10-CM | POA: Diagnosis not present

## 2016-08-15 ENCOUNTER — Other Ambulatory Visit: Payer: Self-pay | Admitting: *Deleted

## 2016-08-15 DIAGNOSIS — M4802 Spinal stenosis, cervical region: Secondary | ICD-10-CM | POA: Diagnosis not present

## 2016-08-15 DIAGNOSIS — I890 Lymphedema, not elsewhere classified: Secondary | ICD-10-CM | POA: Diagnosis not present

## 2016-08-15 DIAGNOSIS — G35 Multiple sclerosis: Secondary | ICD-10-CM | POA: Diagnosis not present

## 2016-08-15 DIAGNOSIS — R32 Unspecified urinary incontinence: Secondary | ICD-10-CM | POA: Diagnosis not present

## 2016-08-15 DIAGNOSIS — G40909 Epilepsy, unspecified, not intractable, without status epilepticus: Secondary | ICD-10-CM | POA: Diagnosis not present

## 2016-08-15 DIAGNOSIS — K589 Irritable bowel syndrome without diarrhea: Secondary | ICD-10-CM | POA: Diagnosis not present

## 2016-08-15 MED ORDER — OXYCODONE HCL 5 MG PO TABS: 5.0000 mg | ORAL_TABLET | Freq: Four times a day (QID) | ORAL | 0 refills | Status: DC | PRN

## 2016-08-16 ENCOUNTER — Other Ambulatory Visit: Payer: Self-pay | Admitting: *Deleted

## 2016-08-16 DIAGNOSIS — I890 Lymphedema, not elsewhere classified: Secondary | ICD-10-CM | POA: Diagnosis not present

## 2016-08-16 DIAGNOSIS — R32 Unspecified urinary incontinence: Secondary | ICD-10-CM | POA: Diagnosis not present

## 2016-08-16 DIAGNOSIS — G35 Multiple sclerosis: Secondary | ICD-10-CM | POA: Diagnosis not present

## 2016-08-16 DIAGNOSIS — M4802 Spinal stenosis, cervical region: Secondary | ICD-10-CM | POA: Diagnosis not present

## 2016-08-16 DIAGNOSIS — G40909 Epilepsy, unspecified, not intractable, without status epilepticus: Secondary | ICD-10-CM | POA: Diagnosis not present

## 2016-08-16 DIAGNOSIS — K589 Irritable bowel syndrome without diarrhea: Secondary | ICD-10-CM | POA: Diagnosis not present

## 2016-08-16 MED ORDER — OXYCODONE HCL 5 MG PO TABS: 5.0000 mg | ORAL_TABLET | Freq: Four times a day (QID) | ORAL | 0 refills | Status: DC | PRN

## 2016-08-17 DIAGNOSIS — K589 Irritable bowel syndrome without diarrhea: Secondary | ICD-10-CM | POA: Diagnosis not present

## 2016-08-17 DIAGNOSIS — I890 Lymphedema, not elsewhere classified: Secondary | ICD-10-CM | POA: Diagnosis not present

## 2016-08-17 DIAGNOSIS — G40909 Epilepsy, unspecified, not intractable, without status epilepticus: Secondary | ICD-10-CM | POA: Diagnosis not present

## 2016-08-17 DIAGNOSIS — G35 Multiple sclerosis: Secondary | ICD-10-CM | POA: Diagnosis not present

## 2016-08-17 DIAGNOSIS — M4802 Spinal stenosis, cervical region: Secondary | ICD-10-CM | POA: Diagnosis not present

## 2016-08-17 DIAGNOSIS — R32 Unspecified urinary incontinence: Secondary | ICD-10-CM | POA: Diagnosis not present

## 2016-08-19 DIAGNOSIS — M4802 Spinal stenosis, cervical region: Secondary | ICD-10-CM | POA: Diagnosis not present

## 2016-08-19 DIAGNOSIS — G40909 Epilepsy, unspecified, not intractable, without status epilepticus: Secondary | ICD-10-CM | POA: Diagnosis not present

## 2016-08-19 DIAGNOSIS — I890 Lymphedema, not elsewhere classified: Secondary | ICD-10-CM | POA: Diagnosis not present

## 2016-08-19 DIAGNOSIS — G35 Multiple sclerosis: Secondary | ICD-10-CM | POA: Diagnosis not present

## 2016-08-19 DIAGNOSIS — R32 Unspecified urinary incontinence: Secondary | ICD-10-CM | POA: Diagnosis not present

## 2016-08-19 DIAGNOSIS — K589 Irritable bowel syndrome without diarrhea: Secondary | ICD-10-CM | POA: Diagnosis not present

## 2016-08-21 DIAGNOSIS — R32 Unspecified urinary incontinence: Secondary | ICD-10-CM | POA: Diagnosis not present

## 2016-08-21 DIAGNOSIS — K589 Irritable bowel syndrome without diarrhea: Secondary | ICD-10-CM | POA: Diagnosis not present

## 2016-08-21 DIAGNOSIS — M4802 Spinal stenosis, cervical region: Secondary | ICD-10-CM | POA: Diagnosis not present

## 2016-08-21 DIAGNOSIS — I890 Lymphedema, not elsewhere classified: Secondary | ICD-10-CM | POA: Diagnosis not present

## 2016-08-21 DIAGNOSIS — G35 Multiple sclerosis: Secondary | ICD-10-CM | POA: Diagnosis not present

## 2016-08-21 DIAGNOSIS — G40909 Epilepsy, unspecified, not intractable, without status epilepticus: Secondary | ICD-10-CM | POA: Diagnosis not present

## 2016-08-23 ENCOUNTER — Ambulatory Visit: Payer: Medicare Other | Admitting: Diagnostic Neuroimaging

## 2016-08-24 DIAGNOSIS — G40909 Epilepsy, unspecified, not intractable, without status epilepticus: Secondary | ICD-10-CM | POA: Diagnosis not present

## 2016-08-24 DIAGNOSIS — R32 Unspecified urinary incontinence: Secondary | ICD-10-CM | POA: Diagnosis not present

## 2016-08-24 DIAGNOSIS — Z853 Personal history of malignant neoplasm of breast: Secondary | ICD-10-CM | POA: Diagnosis not present

## 2016-08-24 DIAGNOSIS — L988 Other specified disorders of the skin and subcutaneous tissue: Secondary | ICD-10-CM | POA: Diagnosis not present

## 2016-08-24 DIAGNOSIS — Z9181 History of falling: Secondary | ICD-10-CM | POA: Diagnosis not present

## 2016-08-24 DIAGNOSIS — K589 Irritable bowel syndrome without diarrhea: Secondary | ICD-10-CM | POA: Diagnosis not present

## 2016-08-24 DIAGNOSIS — M4802 Spinal stenosis, cervical region: Secondary | ICD-10-CM | POA: Diagnosis not present

## 2016-08-24 DIAGNOSIS — I890 Lymphedema, not elsewhere classified: Secondary | ICD-10-CM | POA: Diagnosis not present

## 2016-08-24 DIAGNOSIS — G35 Multiple sclerosis: Secondary | ICD-10-CM | POA: Diagnosis not present

## 2016-08-24 DIAGNOSIS — Z993 Dependence on wheelchair: Secondary | ICD-10-CM | POA: Diagnosis not present

## 2016-08-25 ENCOUNTER — Other Ambulatory Visit: Payer: Self-pay | Admitting: *Deleted

## 2016-08-25 DIAGNOSIS — I890 Lymphedema, not elsewhere classified: Secondary | ICD-10-CM | POA: Diagnosis not present

## 2016-08-25 DIAGNOSIS — L988 Other specified disorders of the skin and subcutaneous tissue: Secondary | ICD-10-CM | POA: Diagnosis not present

## 2016-08-25 DIAGNOSIS — K589 Irritable bowel syndrome without diarrhea: Secondary | ICD-10-CM | POA: Diagnosis not present

## 2016-08-25 DIAGNOSIS — M4802 Spinal stenosis, cervical region: Secondary | ICD-10-CM | POA: Diagnosis not present

## 2016-08-25 DIAGNOSIS — G35 Multiple sclerosis: Secondary | ICD-10-CM | POA: Diagnosis not present

## 2016-08-25 DIAGNOSIS — G40909 Epilepsy, unspecified, not intractable, without status epilepticus: Secondary | ICD-10-CM | POA: Diagnosis not present

## 2016-08-25 MED ORDER — FENTANYL 50 MCG/HR TD PT72: 50.0000 ug | MEDICATED_PATCH | TRANSDERMAL | 0 refills | Status: DC

## 2016-08-26 DIAGNOSIS — K589 Irritable bowel syndrome without diarrhea: Secondary | ICD-10-CM | POA: Diagnosis not present

## 2016-08-26 DIAGNOSIS — I890 Lymphedema, not elsewhere classified: Secondary | ICD-10-CM | POA: Diagnosis not present

## 2016-08-26 DIAGNOSIS — G40909 Epilepsy, unspecified, not intractable, without status epilepticus: Secondary | ICD-10-CM | POA: Diagnosis not present

## 2016-08-26 DIAGNOSIS — L988 Other specified disorders of the skin and subcutaneous tissue: Secondary | ICD-10-CM | POA: Diagnosis not present

## 2016-08-26 DIAGNOSIS — G35 Multiple sclerosis: Secondary | ICD-10-CM | POA: Diagnosis not present

## 2016-08-26 DIAGNOSIS — M4802 Spinal stenosis, cervical region: Secondary | ICD-10-CM | POA: Diagnosis not present

## 2016-08-29 ENCOUNTER — Other Ambulatory Visit: Payer: Self-pay | Admitting: *Deleted

## 2016-08-29 ENCOUNTER — Other Ambulatory Visit: Payer: Self-pay | Admitting: Diagnostic Neuroimaging

## 2016-08-29 DIAGNOSIS — I890 Lymphedema, not elsewhere classified: Secondary | ICD-10-CM | POA: Diagnosis not present

## 2016-08-29 DIAGNOSIS — G40909 Epilepsy, unspecified, not intractable, without status epilepticus: Secondary | ICD-10-CM | POA: Diagnosis not present

## 2016-08-29 DIAGNOSIS — G35 Multiple sclerosis: Secondary | ICD-10-CM | POA: Diagnosis not present

## 2016-08-29 DIAGNOSIS — L988 Other specified disorders of the skin and subcutaneous tissue: Secondary | ICD-10-CM | POA: Diagnosis not present

## 2016-08-29 DIAGNOSIS — K589 Irritable bowel syndrome without diarrhea: Secondary | ICD-10-CM | POA: Diagnosis not present

## 2016-08-29 DIAGNOSIS — M4802 Spinal stenosis, cervical region: Secondary | ICD-10-CM | POA: Diagnosis not present

## 2016-08-30 MED ORDER — OXYCODONE HCL 5 MG PO TABS: 5.0000 mg | ORAL_TABLET | Freq: Four times a day (QID) | ORAL | 0 refills | Status: DC | PRN

## 2016-08-31 ENCOUNTER — Other Ambulatory Visit: Payer: Self-pay | Admitting: Family Medicine

## 2016-08-31 MED ORDER — OXYCODONE HCL 5 MG PO TABS: 5.0000 mg | ORAL_TABLET | Freq: Four times a day (QID) | ORAL | 0 refills | Status: DC | PRN

## 2016-08-31 NOTE — Addendum Note (Signed)
Addended by: Betti Cruz on: 08/31/2016 10:31 AM   Modules accepted: Orders

## 2016-08-31 NOTE — Progress Notes (Deleted)
Cardiology Office Note  Date:  08/31/2016   ID:  TEEA DUCEY, DOB 12/02/1960, MRN [id number]  PCP:  [name], MD   No chief complaint on file.   HPI:  . Follow-up    12 month follow up. Meds reviewed by the patient verbally. "doing well."   . Cardiac Valve Problem    mild AS/AI  . Leg Swelling    lymphedema & venous stasis.    HPI: Rebecca [name] is a 56 y.o. female with a PMH   chronic LE edema (mostly related to Lymphedema).    multiple sclerosis with muscle atrophy in his lower extremities.   edema with venous stasis.  uses Unaboot wraps for mechanical compression. breast cancer and had a right mastectomy with lymph node dissection.  chemotherapy      lost a significant amount of weight from fluid weight. This is all without having to be on diuretic.  remains wheelchair bound.   No chest pain or shortness of breath with rest or exertion. No PND, orthopnea.  No palpitations, lightheadedness, dizziness, weakness or syncope/near syncope. No TIA/amaurosis fugax symptoms.    PMH:   has a past medical history of Aortic valve disease; Bacterial endocarditis; Bilateral lower extremity edema; Breast cancer ([place]) (12-31-13); Cervical stenosis of spine; Herpes zoster; IBS (irritable bowel syndrome); Lymphedema; Multiple sclerosis ([place]) (2001); Neuromuscular disorder ([place]); Seizures ([place]); and Syncope and collapse.  PSH:    Past Surgical History:  Procedure Laterality Date  . ANKLE SURGERY     Left  . ANKLE SURGERY    . ANTERIOR CERVICAL DECOMP/DISCECTOMY FUSION  11/17/2011   Procedure: ANTERIOR CERVICAL DECOMPRESSION/DISCECTOMY FUSION 2 LEVELS;  Surgeon: Erline Levine, MD;  Location: [facility];  Service: Neurosurgery;  Laterality: N/A;  Cervical Five-Six Six-Seven Anterior cervical decompression/diskectomy/fusion  . BREAST BIOPSY Right 12-31-13   invasive mammary  . BREAST SURGERY Right 02/03/2014   Right simple mastectomy with sentinel node biopsy.  .  CHOLECYSTECTOMY    . COLONOSCOPY  2014  . Lower extremity venous Dopplers  Feb 27, 2013   No LE DVT  . MASTECTOMY    . Port a cath insertion Right 01/19/2010  . PORT-A-CATH REMOVAL     right  . PORT-A-CATH REMOVAL Right 09/03/2013   Procedure: REMOVAL PORT-A-CATH;  Surgeon: Conrad Odessa, MD;  Location: [facility];  Service: Vascular;  Laterality: Right;  . TRANSTHORACIC ECHOCARDIOGRAM  03/2013; 02/2014   a) Normal LV size and function with EF 60-65%.; Cannot exclude bicuspid aortic valve with mild AS and mild AI.; b) Normal EF with normal wall motion and valve function x Mild MR. G2 DD. EF 60-65%. Tricuspid AoV  . UPPER GI ENDOSCOPY  2014    Current Outpatient Prescriptions  Medication Sig Dispense Refill  . amantadine (SYMMETREL) 100 MG capsule Take 1 capsule (100 mg total) by mouth 3 (three) times daily. 270 capsule 4  . baclofen (LIORESAL) 10 MG tablet TAKE 1 TO 2 TABLETS BY MOUTH 3 TIMES A DAY 270 tablet 4  . baclofen (LIORESAL) 20 MG tablet Take 1 tablet (20 mg total) by mouth 3 (three) times daily. 270 each 4  . fentaNYL (DURAGESIC - DOSED MCG/HR) 50 MCG/HR Place 1 patch (50 mcg total) onto the skin every 3 (three) days. 10 patch 0  . glucosamine-chondroitin 500-400 MG tablet Take 1 tablet by mouth 4 (four) times daily.    Marland Kitchen ibuprofen (ADVIL,MOTRIN) 600 MG tablet TAKE 1 TABLET (600 MG TOTAL) BY MOUTH EVERY 6 (SIX)  HOURS AS NEEDED.  2  . Incontinence Supply Disposable (PREVAIL WET WIPES) MISC Apply 1 each topically daily as needed. (dx neurogenic bladder N31.9, LON 99 months) 96 each 11  . Interferon Beta-1a (REBIF REBIDOSE) 44 MCG/0.5ML SOAJ Inject 0.5 mLs into the skin 3 (three) times a week. (Patient taking differently: Inject 0.5 mLs into the skin 3 (three) times a week. Monday, Wednesday and Friday) 18 mL 4  . levETIRAcetam (KEPPRA) 500 MG tablet Take 1 tablet (500 mg total) by mouth 2 (two) times daily. 180 tablet 4  . Multiple Vitamins-Minerals (MULTIVITAMIN PO) Take 1 tablet by mouth 2  (two) times daily.     Marland Kitchen oxybutynin (DITROPAN-XL) 10 MG 24 hr tablet TAKE 2 TABLETS BY MOUTH DAILY 180 tablet 3  . oxyCODONE (OXY IR/ROXICODONE) 5 MG immediate release tablet Take 1 tablet (5 mg total) by mouth every 6 (six) hours as needed for moderate pain or severe pain. 60 tablet 0  . prochlorperazine (COMPAZINE) 10 MG tablet TAKE 1 TABLET (10 MG TOTAL) BY MOUTH EVERY 6 (SIX) HOURS AS NEEDED.  5   No current facility-administered medications for this visit.      Allergies:   Fentanyl and Sulfa antibiotics   Social History:  The patient  reports that she has never smoked. She has never used smokeless tobacco. She reports that she does not drink alcohol or use drugs.   Family History:   family history includes Breast cancer (age of onset: 29) in her maternal aunt; Breast cancer (age of onset: 37) in her maternal grandmother; Cancer in her father; Heart attack in her father; Heart disease in her father; Ovarian cancer in her cousin; Thyroid disease in her sister.    Review of Systems: ROS   PHYSICAL EXAM: VS:  There were no vitals taken for this visit. , BMI There is no height or weight on file to calculate BMI. GEN: Well nourished, well developed, in no acute distress HEENT: normal Neck: no JVD, carotid bruits, or masses Cardiac: RRR; no murmurs, rubs, or gallops,no edema  Respiratory:  clear to auscultation bilaterally, normal work of breathing GI: soft, nontender, nondistended, + BS MS: no deformity or atrophy Skin: warm and dry, no rash Neuro:  Strength and sensation are intact Psych: euthymic mood, full affect    Recent Labs: 11/25/2015: TSH 1.83 06/27/2016: Hemoglobin 12.4; Platelets 163 07/28/2016: ALT 22; BUN 12; Creatinine, Ser 0.55; Magnesium 1.9; Potassium 4.5; Sodium 143    Lipid Panel Lab Results  Component Value Date   CHOL 214 (H) 11/25/2015   HDL 58 11/25/2015   LDLCALC 134 (H) 11/25/2015   TRIG 109 11/25/2015      Wt Readings from Last 3 Encounters:   07/28/16 170 lb (77.1 kg)  05/20/16 193 lb (87.5 kg)  05/14/16 175 lb (79.4 kg)       ASSESSMENT AND PLAN:  No diagnosis found.   Disposition:   F/U  6 months  No orders of the defined types were placed in this encounter.    Signed, Esmond Plants, M.D., Ph.D. 08/31/2016  [place], [place]

## 2016-09-01 ENCOUNTER — Ambulatory Visit: Payer: Medicare Other | Admitting: Cardiovascular Disease

## 2016-09-01 DIAGNOSIS — M4802 Spinal stenosis, cervical region: Secondary | ICD-10-CM | POA: Diagnosis not present

## 2016-09-01 DIAGNOSIS — I890 Lymphedema, not elsewhere classified: Secondary | ICD-10-CM | POA: Diagnosis not present

## 2016-09-01 DIAGNOSIS — G40909 Epilepsy, unspecified, not intractable, without status epilepticus: Secondary | ICD-10-CM | POA: Diagnosis not present

## 2016-09-01 DIAGNOSIS — K589 Irritable bowel syndrome without diarrhea: Secondary | ICD-10-CM | POA: Diagnosis not present

## 2016-09-01 DIAGNOSIS — L988 Other specified disorders of the skin and subcutaneous tissue: Secondary | ICD-10-CM | POA: Diagnosis not present

## 2016-09-01 DIAGNOSIS — G35 Multiple sclerosis: Secondary | ICD-10-CM | POA: Diagnosis not present

## 2016-09-05 ENCOUNTER — Telehealth: Payer: Self-pay | Admitting: Family Medicine

## 2016-09-05 MED ORDER — IBUPROFEN 600 MG PO TABS: 600.0000 mg | ORAL_TABLET | Freq: Four times a day (QID) | ORAL | 2 refills | Status: DC | PRN

## 2016-09-05 NOTE — Telephone Encounter (Signed)
I see what happened It was taken off of her medicine list per her hospital discharge summary I think that was because she was on Lovenox at the time She is no longer on Lovenox Her last creatinine was fine I'm okay restarting the ibuprofen and will refill Thank her for calling

## 2016-09-05 NOTE — Telephone Encounter (Signed)
Patient wanted to know why the ibuprofen was denied. She said on her RX bottle it has Dr. Delight Ovens name. Please advise.

## 2016-09-06 DIAGNOSIS — I890 Lymphedema, not elsewhere classified: Secondary | ICD-10-CM | POA: Diagnosis not present

## 2016-09-06 DIAGNOSIS — G40909 Epilepsy, unspecified, not intractable, without status epilepticus: Secondary | ICD-10-CM | POA: Diagnosis not present

## 2016-09-06 DIAGNOSIS — M4802 Spinal stenosis, cervical region: Secondary | ICD-10-CM | POA: Diagnosis not present

## 2016-09-06 DIAGNOSIS — L988 Other specified disorders of the skin and subcutaneous tissue: Secondary | ICD-10-CM | POA: Diagnosis not present

## 2016-09-06 DIAGNOSIS — G35 Multiple sclerosis: Secondary | ICD-10-CM | POA: Diagnosis not present

## 2016-09-06 DIAGNOSIS — K589 Irritable bowel syndrome without diarrhea: Secondary | ICD-10-CM | POA: Diagnosis not present

## 2016-09-06 NOTE — Telephone Encounter (Signed)
Pt.notified

## 2016-09-08 DIAGNOSIS — K589 Irritable bowel syndrome without diarrhea: Secondary | ICD-10-CM | POA: Diagnosis not present

## 2016-09-08 DIAGNOSIS — G35 Multiple sclerosis: Secondary | ICD-10-CM | POA: Diagnosis not present

## 2016-09-08 DIAGNOSIS — L988 Other specified disorders of the skin and subcutaneous tissue: Secondary | ICD-10-CM | POA: Diagnosis not present

## 2016-09-08 DIAGNOSIS — G40909 Epilepsy, unspecified, not intractable, without status epilepticus: Secondary | ICD-10-CM | POA: Diagnosis not present

## 2016-09-08 DIAGNOSIS — I890 Lymphedema, not elsewhere classified: Secondary | ICD-10-CM | POA: Diagnosis not present

## 2016-09-08 DIAGNOSIS — M4802 Spinal stenosis, cervical region: Secondary | ICD-10-CM | POA: Diagnosis not present

## 2016-09-12 ENCOUNTER — Other Ambulatory Visit: Payer: Self-pay | Admitting: *Deleted

## 2016-09-12 ENCOUNTER — Telehealth: Payer: Self-pay | Admitting: *Deleted

## 2016-09-12 MED ORDER — OXYCODONE HCL 5 MG PO TABS: 5.0000 mg | ORAL_TABLET | Freq: Four times a day (QID) | ORAL | 0 refills | Status: DC | PRN

## 2016-09-12 NOTE — Telephone Encounter (Signed)
Patient requests refill of pain medication. Medication refilled per Dr. Burlene Arnt.

## 2016-09-13 ENCOUNTER — Inpatient Hospital Stay: Payer: Medicare Other

## 2016-09-13 ENCOUNTER — Other Ambulatory Visit: Payer: Self-pay | Admitting: *Deleted

## 2016-09-13 DIAGNOSIS — K589 Irritable bowel syndrome without diarrhea: Secondary | ICD-10-CM | POA: Diagnosis not present

## 2016-09-13 DIAGNOSIS — G35 Multiple sclerosis: Secondary | ICD-10-CM | POA: Diagnosis not present

## 2016-09-13 DIAGNOSIS — G40909 Epilepsy, unspecified, not intractable, without status epilepticus: Secondary | ICD-10-CM | POA: Diagnosis not present

## 2016-09-13 DIAGNOSIS — L988 Other specified disorders of the skin and subcutaneous tissue: Secondary | ICD-10-CM | POA: Diagnosis not present

## 2016-09-13 DIAGNOSIS — I890 Lymphedema, not elsewhere classified: Secondary | ICD-10-CM | POA: Diagnosis not present

## 2016-09-13 DIAGNOSIS — M4802 Spinal stenosis, cervical region: Secondary | ICD-10-CM | POA: Diagnosis not present

## 2016-09-13 MED ORDER — OXYCODONE HCL 5 MG PO TABS: 5.0000 mg | ORAL_TABLET | Freq: Four times a day (QID) | ORAL | 0 refills | Status: DC | PRN

## 2016-09-14 DIAGNOSIS — G40909 Epilepsy, unspecified, not intractable, without status epilepticus: Secondary | ICD-10-CM | POA: Diagnosis not present

## 2016-09-14 DIAGNOSIS — G35 Multiple sclerosis: Secondary | ICD-10-CM | POA: Diagnosis not present

## 2016-09-14 DIAGNOSIS — I890 Lymphedema, not elsewhere classified: Secondary | ICD-10-CM | POA: Diagnosis not present

## 2016-09-14 DIAGNOSIS — K589 Irritable bowel syndrome without diarrhea: Secondary | ICD-10-CM | POA: Diagnosis not present

## 2016-09-14 DIAGNOSIS — L988 Other specified disorders of the skin and subcutaneous tissue: Secondary | ICD-10-CM | POA: Diagnosis not present

## 2016-09-14 DIAGNOSIS — M4802 Spinal stenosis, cervical region: Secondary | ICD-10-CM | POA: Diagnosis not present

## 2016-09-16 ENCOUNTER — Inpatient Hospital Stay: Payer: Medicare Other

## 2016-09-19 ENCOUNTER — Inpatient Hospital Stay: Payer: Medicare Other | Attending: Oncology

## 2016-09-19 DIAGNOSIS — Z9011 Acquired absence of right breast and nipple: Secondary | ICD-10-CM | POA: Diagnosis not present

## 2016-09-19 DIAGNOSIS — G35 Multiple sclerosis: Secondary | ICD-10-CM | POA: Diagnosis not present

## 2016-09-19 DIAGNOSIS — Z9221 Personal history of antineoplastic chemotherapy: Secondary | ICD-10-CM | POA: Diagnosis not present

## 2016-09-19 DIAGNOSIS — K589 Irritable bowel syndrome without diarrhea: Secondary | ICD-10-CM | POA: Insufficient documentation

## 2016-09-19 DIAGNOSIS — G629 Polyneuropathy, unspecified: Secondary | ICD-10-CM | POA: Diagnosis not present

## 2016-09-19 DIAGNOSIS — Z853 Personal history of malignant neoplasm of breast: Secondary | ICD-10-CM | POA: Diagnosis not present

## 2016-09-19 DIAGNOSIS — Z79899 Other long term (current) drug therapy: Secondary | ICD-10-CM | POA: Insufficient documentation

## 2016-09-19 DIAGNOSIS — Z452 Encounter for adjustment and management of vascular access device: Secondary | ICD-10-CM | POA: Diagnosis not present

## 2016-09-19 DIAGNOSIS — Z171 Estrogen receptor negative status [ER-]: Secondary | ICD-10-CM | POA: Insufficient documentation

## 2016-09-19 DIAGNOSIS — Z95828 Presence of other vascular implants and grafts: Secondary | ICD-10-CM

## 2016-09-20 ENCOUNTER — Telehealth: Payer: Self-pay | Admitting: Family Medicine

## 2016-09-20 MED ORDER — SODIUM CHLORIDE 0.9% FLUSH
10.0000 mL | INTRAVENOUS | Status: DC | PRN
  Administered 2016-09-19: 10 mL via INTRAVENOUS
  Filled 2016-09-20: qty 10

## 2016-09-20 MED ORDER — HEPARIN SOD (PORK) LOCK FLUSH 100 UNIT/ML IV SOLN
500.0000 [IU] | Freq: Once | INTRAVENOUS | Status: AC
  Administered 2016-09-19: 500 [IU] via INTRAVENOUS

## 2016-09-20 NOTE — Telephone Encounter (Signed)
Lana from [facility] is requesting verbal order for una boot for once week for 8 weeks due to lipidemia (swelling of leg). It is okay to leave detailed message [phone]

## 2016-09-20 NOTE — Telephone Encounter (Signed)
Lana nurse notified

## 2016-09-20 NOTE — Telephone Encounter (Signed)
That's fine Diagnosis will be lymphedema

## 2016-09-21 DIAGNOSIS — G40909 Epilepsy, unspecified, not intractable, without status epilepticus: Secondary | ICD-10-CM | POA: Diagnosis not present

## 2016-09-21 DIAGNOSIS — I890 Lymphedema, not elsewhere classified: Secondary | ICD-10-CM | POA: Diagnosis not present

## 2016-09-21 DIAGNOSIS — G35 Multiple sclerosis: Secondary | ICD-10-CM | POA: Diagnosis not present

## 2016-09-21 DIAGNOSIS — M4802 Spinal stenosis, cervical region: Secondary | ICD-10-CM | POA: Diagnosis not present

## 2016-09-21 DIAGNOSIS — K589 Irritable bowel syndrome without diarrhea: Secondary | ICD-10-CM | POA: Diagnosis not present

## 2016-09-21 DIAGNOSIS — L988 Other specified disorders of the skin and subcutaneous tissue: Secondary | ICD-10-CM | POA: Diagnosis not present

## 2016-09-27 ENCOUNTER — Other Ambulatory Visit: Payer: Self-pay | Admitting: *Deleted

## 2016-09-27 DIAGNOSIS — I890 Lymphedema, not elsewhere classified: Secondary | ICD-10-CM | POA: Diagnosis not present

## 2016-09-27 DIAGNOSIS — K589 Irritable bowel syndrome without diarrhea: Secondary | ICD-10-CM | POA: Diagnosis not present

## 2016-09-27 DIAGNOSIS — G40909 Epilepsy, unspecified, not intractable, without status epilepticus: Secondary | ICD-10-CM | POA: Diagnosis not present

## 2016-09-27 DIAGNOSIS — L988 Other specified disorders of the skin and subcutaneous tissue: Secondary | ICD-10-CM | POA: Diagnosis not present

## 2016-09-27 DIAGNOSIS — G35 Multiple sclerosis: Secondary | ICD-10-CM | POA: Diagnosis not present

## 2016-09-27 DIAGNOSIS — M4802 Spinal stenosis, cervical region: Secondary | ICD-10-CM | POA: Diagnosis not present

## 2016-09-27 MED ORDER — OXYCODONE HCL 5 MG PO TABS: 5.0000 mg | ORAL_TABLET | Freq: Three times a day (TID) | ORAL | 0 refills | Status: DC | PRN

## 2016-09-27 NOTE — Progress Notes (Deleted)
Cardiology Office Note  Date:  09/27/2016   ID:  SPRUHA WEIGHT, DOB 1960/04/21, MRN [id number]  PCP:  [name], MD   No chief complaint on file.   HPI:  Rebecca [name] is a 56 y.o. female with a PMH of chronic LE edema (mostly related to Lymphedema).   multiple sclerosis with muscle atrophy in his lower extremities edema with venous stasis. uses Unaboot wraps for mechanical compression. Breast CA right mastectomy with lymph node dissection.  chemotherapy    compression stockings and pressure boots  remains wheelchair bound.   PMH:   Bacterial endocarditis; Bilateral lower extremity edema; Breast cancer ([place]) (12-31-13); Cervical stenosis of spine; Herpes zoster; IBS (irritable bowel syndrome); Lymphedema; Multiple sclerosis ([place]) (2001); Neuromuscular disorder ([place]); Seizures ([place]); and Syncope and collapse.  PSH:    Past Surgical History:  Procedure Laterality Date  . ANKLE SURGERY     Left  . ANKLE SURGERY    . ANTERIOR CERVICAL DECOMP/DISCECTOMY FUSION  11/17/2011   Procedure: ANTERIOR CERVICAL DECOMPRESSION/DISCECTOMY FUSION 2 LEVELS;  Surgeon: Erline Levine, MD;  Location: [facility];  Service: Neurosurgery;  Laterality: N/A;  Cervical Five-Six Six-Seven Anterior cervical decompression/diskectomy/fusion  . BREAST BIOPSY Right 12-31-13   invasive mammary  . BREAST SURGERY Right 02/03/2014   Right simple mastectomy with sentinel node biopsy.  . CHOLECYSTECTOMY    . COLONOSCOPY  2014  . Lower extremity venous Dopplers  Feb 27, 2013   No LE DVT  . MASTECTOMY    . Port a cath insertion Right 01/19/2010  . PORT-A-CATH REMOVAL     right  . PORT-A-CATH REMOVAL Right 09/03/2013   Procedure: REMOVAL PORT-A-CATH;  Surgeon: Conrad Richburg, MD;  Location: [facility];  Service: Vascular;  Laterality: Right;  . TRANSTHORACIC ECHOCARDIOGRAM  03/2013; 02/2014   a) Normal LV size and function with EF 60-65%.; Cannot exclude bicuspid aortic valve with mild AS and mild AI.; b) Normal EF  with normal wall motion and valve function x Mild MR. G2 DD. EF 60-65%. Tricuspid AoV  . UPPER GI ENDOSCOPY  2014    Current Outpatient Prescriptions  Medication Sig Dispense Refill  . amantadine (SYMMETREL) 100 MG capsule Take 1 capsule (100 mg total) by mouth 3 (three) times daily. 270 capsule 4  . baclofen (LIORESAL) 10 MG tablet TAKE 1 TO 2 TABLETS BY MOUTH 3 TIMES A DAY 270 tablet 4  . baclofen (LIORESAL) 20 MG tablet Take 1 tablet (20 mg total) by mouth 3 (three) times daily. 270 each 4  . fentaNYL (DURAGESIC - DOSED MCG/HR) 50 MCG/HR Place 1 patch (50 mcg total) onto the skin every 3 (three) days. 10 patch 0  . glucosamine-chondroitin 500-400 MG tablet Take 1 tablet by mouth 4 (four) times daily.    Marland Kitchen ibuprofen (ADVIL,MOTRIN) 600 MG tablet Take 1 tablet (600 mg total) by mouth every 6 (six) hours as needed. 30 tablet 2  . Incontinence Supply Disposable (PREVAIL WET WIPES) MISC Apply 1 each topically daily as needed. (dx neurogenic bladder N31.9, LON 99 months) 96 each 11  . Interferon Beta-1a (REBIF REBIDOSE) 44 MCG/0.5ML SOAJ Inject 0.5 mLs into the skin 3 (three) times a week. (Patient taking differently: Inject 0.5 mLs into the skin 3 (three) times a week. Monday, Wednesday and Friday) 18 mL 4  . levETIRAcetam (KEPPRA) 500 MG tablet Take 1 tablet (500 mg total) by mouth 2 (two) times daily. 180 tablet 4  . Multiple Vitamins-Minerals (MULTIVITAMIN PO) Take 1 tablet by  mouth 2 (two) times daily.     Marland Kitchen oxybutynin (DITROPAN-XL) 10 MG 24 hr tablet TAKE 2 TABLETS BY MOUTH DAILY 180 tablet 3  . oxyCODONE (OXY IR/ROXICODONE) 5 MG immediate release tablet Take 1 tablet (5 mg total) by mouth every 8 (eight) hours as needed for moderate pain or severe pain. 45 tablet 0  . prochlorperazine (COMPAZINE) 10 MG tablet TAKE 1 TABLET (10 MG TOTAL) BY MOUTH EVERY 6 (SIX) HOURS AS NEEDED.  5   No current facility-administered medications for this visit.      Allergies:   Fentanyl and Sulfa antibiotics    Social History:  The patient  reports that she has never smoked. She has never used smokeless tobacco. She reports that she does not drink alcohol or use drugs.   Family History:   family history includes Breast cancer (age of onset: 15) in her maternal aunt; Breast cancer (age of onset: 42) in her maternal grandmother; Cancer in her father; Heart attack in her father; Heart disease in her father; Ovarian cancer in her cousin; Thyroid disease in her sister.    Review of Systems: ROS   PHYSICAL EXAM: VS:  There were no vitals taken for this visit. , BMI There is no height or weight on file to calculate BMI. GEN: Well nourished, well developed, in no acute distress HEENT: normal Neck: no JVD, carotid bruits, or masses Cardiac: RRR; no murmurs, rubs, or gallops,no edema  Respiratory:  clear to auscultation bilaterally, normal work of breathing GI: soft, nontender, nondistended, + BS MS: no deformity or atrophy Skin: warm and dry, no rash Neuro:  Strength and sensation are intact Psych: euthymic mood, full affect    Recent Labs: 11/25/2015: TSH 1.83 06/27/2016: Hemoglobin 12.4; Platelets 163 07/28/2016: ALT 22; BUN 12; Creatinine, Ser 0.55; Magnesium 1.9; Potassium 4.5; Sodium 143    Lipid Panel Lab Results  Component Value Date   CHOL 214 (H) 11/25/2015   HDL 58 11/25/2015   LDLCALC 134 (H) 11/25/2015   TRIG 109 11/25/2015      Wt Readings from Last 3 Encounters:  07/28/16 170 lb (77.1 kg)  05/20/16 193 lb (87.5 kg)  05/14/16 175 lb (79.4 kg)       ASSESSMENT AND PLAN:  No diagnosis found.   Disposition:   F/U  6 months  No orders of the defined types were placed in this encounter.    Signed, Esmond Plants, M.D., Ph.D. 09/27/2016  [place], [place]

## 2016-09-27 NOTE — Telephone Encounter (Signed)
Patient informed of dosing change and I asked per VO Dr Grayland Ormond if she has seen the pain clinic doctor yet. She repeated dose change back to me and stated she has not been to the pain clinic yet and that she does not have an appointment because you "just walk in" I advised that she needs to go ahead and get seen because doctor will not continue to write for her narcotics indefinitely. She verbalized understanding and statred she will go to pain clinic

## 2016-09-28 ENCOUNTER — Ambulatory Visit: Payer: Medicare Other | Admitting: Cardiovascular Disease

## 2016-09-28 MED ORDER — OXYCODONE HCL 5 MG PO TABS
5.0000 mg | ORAL_TABLET | Freq: Three times a day (TID) | ORAL | 0 refills | Status: DC | PRN
Start: 2016-09-28 — End: 2016-11-09

## 2016-09-28 NOTE — Addendum Note (Signed)
Addended by: Betti Cruz on: 09/28/2016 08:48 AM   Modules accepted: Orders

## 2016-10-04 ENCOUNTER — Other Ambulatory Visit: Payer: Self-pay | Admitting: *Deleted

## 2016-10-04 MED ORDER — FENTANYL 50 MCG/HR TD PT72
50.0000 ug | MEDICATED_PATCH | TRANSDERMAL | 0 refills | Status: DC
Start: 2016-10-04 — End: 2017-02-13

## 2016-10-05 DIAGNOSIS — I890 Lymphedema, not elsewhere classified: Secondary | ICD-10-CM | POA: Diagnosis not present

## 2016-10-05 DIAGNOSIS — G35 Multiple sclerosis: Secondary | ICD-10-CM | POA: Diagnosis not present

## 2016-10-05 DIAGNOSIS — L988 Other specified disorders of the skin and subcutaneous tissue: Secondary | ICD-10-CM | POA: Diagnosis not present

## 2016-10-05 DIAGNOSIS — G40909 Epilepsy, unspecified, not intractable, without status epilepticus: Secondary | ICD-10-CM | POA: Diagnosis not present

## 2016-10-05 DIAGNOSIS — K589 Irritable bowel syndrome without diarrhea: Secondary | ICD-10-CM | POA: Diagnosis not present

## 2016-10-05 DIAGNOSIS — M4802 Spinal stenosis, cervical region: Secondary | ICD-10-CM | POA: Diagnosis not present

## 2016-10-10 ENCOUNTER — Other Ambulatory Visit: Payer: Self-pay | Admitting: Family Medicine

## 2016-10-12 DIAGNOSIS — K589 Irritable bowel syndrome without diarrhea: Secondary | ICD-10-CM | POA: Diagnosis not present

## 2016-10-12 DIAGNOSIS — M4802 Spinal stenosis, cervical region: Secondary | ICD-10-CM | POA: Diagnosis not present

## 2016-10-12 DIAGNOSIS — I890 Lymphedema, not elsewhere classified: Secondary | ICD-10-CM | POA: Diagnosis not present

## 2016-10-12 DIAGNOSIS — G35 Multiple sclerosis: Secondary | ICD-10-CM | POA: Diagnosis not present

## 2016-10-12 DIAGNOSIS — G40909 Epilepsy, unspecified, not intractable, without status epilepticus: Secondary | ICD-10-CM | POA: Diagnosis not present

## 2016-10-12 DIAGNOSIS — L988 Other specified disorders of the skin and subcutaneous tissue: Secondary | ICD-10-CM | POA: Diagnosis not present

## 2016-10-19 ENCOUNTER — Ambulatory Visit: Payer: Medicare Other | Admitting: Diagnostic Neuroimaging

## 2016-10-19 DIAGNOSIS — M4802 Spinal stenosis, cervical region: Secondary | ICD-10-CM | POA: Diagnosis not present

## 2016-10-19 DIAGNOSIS — I890 Lymphedema, not elsewhere classified: Secondary | ICD-10-CM | POA: Diagnosis not present

## 2016-10-19 DIAGNOSIS — G35 Multiple sclerosis: Secondary | ICD-10-CM | POA: Diagnosis not present

## 2016-10-19 DIAGNOSIS — G40909 Epilepsy, unspecified, not intractable, without status epilepticus: Secondary | ICD-10-CM | POA: Diagnosis not present

## 2016-10-19 DIAGNOSIS — K589 Irritable bowel syndrome without diarrhea: Secondary | ICD-10-CM | POA: Diagnosis not present

## 2016-10-19 DIAGNOSIS — L988 Other specified disorders of the skin and subcutaneous tissue: Secondary | ICD-10-CM | POA: Diagnosis not present

## 2016-10-20 ENCOUNTER — Encounter: Payer: Medicare Other | Admitting: Physical Medicine & Rehabilitation

## 2016-10-23 DIAGNOSIS — M4802 Spinal stenosis, cervical region: Secondary | ICD-10-CM | POA: Diagnosis not present

## 2016-10-23 DIAGNOSIS — K589 Irritable bowel syndrome without diarrhea: Secondary | ICD-10-CM | POA: Diagnosis not present

## 2016-10-23 DIAGNOSIS — I890 Lymphedema, not elsewhere classified: Secondary | ICD-10-CM | POA: Diagnosis not present

## 2016-10-23 DIAGNOSIS — G35 Multiple sclerosis: Secondary | ICD-10-CM | POA: Diagnosis not present

## 2016-10-23 DIAGNOSIS — G40909 Epilepsy, unspecified, not intractable, without status epilepticus: Secondary | ICD-10-CM | POA: Diagnosis not present

## 2016-10-23 DIAGNOSIS — Z993 Dependence on wheelchair: Secondary | ICD-10-CM | POA: Diagnosis not present

## 2016-10-23 DIAGNOSIS — L988 Other specified disorders of the skin and subcutaneous tissue: Secondary | ICD-10-CM | POA: Diagnosis not present

## 2016-10-23 DIAGNOSIS — Z853 Personal history of malignant neoplasm of breast: Secondary | ICD-10-CM | POA: Diagnosis not present

## 2016-10-23 DIAGNOSIS — Z9181 History of falling: Secondary | ICD-10-CM | POA: Diagnosis not present

## 2016-10-23 DIAGNOSIS — R32 Unspecified urinary incontinence: Secondary | ICD-10-CM | POA: Diagnosis not present

## 2016-10-25 ENCOUNTER — Inpatient Hospital Stay: Payer: Medicare Other

## 2016-10-26 ENCOUNTER — Inpatient Hospital Stay: Payer: Medicare Other

## 2016-10-27 DIAGNOSIS — L988 Other specified disorders of the skin and subcutaneous tissue: Secondary | ICD-10-CM | POA: Diagnosis not present

## 2016-10-27 DIAGNOSIS — M4802 Spinal stenosis, cervical region: Secondary | ICD-10-CM | POA: Diagnosis not present

## 2016-10-27 DIAGNOSIS — G35 Multiple sclerosis: Secondary | ICD-10-CM | POA: Diagnosis not present

## 2016-10-27 DIAGNOSIS — G40909 Epilepsy, unspecified, not intractable, without status epilepticus: Secondary | ICD-10-CM | POA: Diagnosis not present

## 2016-10-27 DIAGNOSIS — I890 Lymphedema, not elsewhere classified: Secondary | ICD-10-CM | POA: Diagnosis not present

## 2016-10-27 DIAGNOSIS — K589 Irritable bowel syndrome without diarrhea: Secondary | ICD-10-CM | POA: Diagnosis not present

## 2016-10-28 ENCOUNTER — Encounter: Payer: Self-pay | Admitting: Family Medicine

## 2016-10-28 ENCOUNTER — Ambulatory Visit (INDEPENDENT_AMBULATORY_CARE_PROVIDER_SITE_OTHER): Payer: Medicare Other | Admitting: Family Medicine

## 2016-10-28 VITALS — BP 116/72 | HR 68 | Temp 97.8°F | Resp 18 | Ht 64.0 in | Wt 170.0 lb

## 2016-10-28 DIAGNOSIS — I890 Lymphedema, not elsewhere classified: Secondary | ICD-10-CM

## 2016-10-28 DIAGNOSIS — Z23 Encounter for immunization: Secondary | ICD-10-CM

## 2016-10-28 DIAGNOSIS — F1190 Opioid use, unspecified, uncomplicated: Secondary | ICD-10-CM

## 2016-10-28 DIAGNOSIS — G894 Chronic pain syndrome: Secondary | ICD-10-CM

## 2016-10-28 DIAGNOSIS — Z853 Personal history of malignant neoplasm of breast: Secondary | ICD-10-CM

## 2016-10-28 DIAGNOSIS — G35 Multiple sclerosis: Secondary | ICD-10-CM

## 2016-10-28 MED ORDER — IBUPROFEN 600 MG PO TABS: 600.0000 mg | ORAL_TABLET | Freq: Three times a day (TID) | ORAL | 5 refills | Status: DC | PRN

## 2016-10-28 NOTE — Patient Instructions (Signed)
Please contact Dr. Grayland Ormond about your pain clinic referral and pain medicine needs; that won't be something I will prescribe
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
## 2016-10-28 NOTE — Assessment & Plan Note (Signed)
Follow up with oncologist

## 2016-10-28 NOTE — Assessment & Plan Note (Signed)
[facility]  Medical Oncology Follow-up Consultation    Referring physician: Dr. 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     History of present illness:   12/03/2020 - Patient undergoes ultrasound of the soft tissue head and neck for further evaluation of abnormal right sided lymph node demonstrating 2.2 x 2.2 x 1.5cm mass in level 2 of the neck, indeterminate possibly abnormal cervical lymph node amenable to percutaneous biopsy. Withint he thyroid a TR4 left thyroid nodule 2.1 x 0.8 x 1.0cm is noted.   12/16/2020 -  Abnormal lymph node with FNA of the right neck demonstrates squamous cell cancer with focal strong p16 positivity. Left thyroid nodule demonstrates a benign follicular nodule.   12/21/2020 - Seen by Rebecca [name] at [facility] for TxN1 cancer with recommendation for CT Neck and PET/CT to help identify primary site.   01/06/2021 - Seen in multidisciplinary head and neck clinic with recommendation for PET/CT and CT Neck for staging.   01/28/2021 - Patient undergoes PET/CT demonstrating intensely hypermetabolic right level II cervical lymph node most consistent with metastatic node and a smaller less FDG avid node just caudal to the aforementioned suspicious for malignant involvement, asymmetric FDG uptake of left tongue base compared to right of unknown significance, small right lung moduels too small to characterize on PET/CT. Same day CT neck soft tissue with contrast demonstrates asymmetric nodularity along the left tongue base corresponding to hypermetabolic focus on concurrent PET, with demonstration of 2.1 x 1.8cm  level 2B lymphadenopathy and immediately infeerior to this another 0.7 x 0.5cm lymph node.   02/10/2021 - Patient is seen at head and neck tumor board with recommendation for for panendoscopy + biopsy of right BOT and any other suspicious lesions with primary chemoradiation if no primary tumor is found.   02/12/2021 - Patient undergoes pan endoscopy demonstrating nodularity of the left base of tongue corresponding to the FDG avid lesion without associated mucosal ulceration. Multiple frozen sections are negative for carcinoma. Very small nodular palatine tonsil on right with frozen section suspicious for carcinoma. Final pathology demonstrates that left BOT negative for neoplasm, midline base of tongue negative for neoplasm, left BOT negative for neoplasm, right palatine tonsil positive for p16 positive squamous cell carcinoma  03/08/2021 - Follow-up in Rebecca [name]'s clinic, chemoradiation is discussed with patient.   03/09/2021 - Follow-up in Rebecca [name]'s clinic in consideration for proton therapy with planned treatment start date 04/05/2021.   03/18/2021 - Patient is seen in pre-therapy visit with SLP. Patient is seen same day in medical oncology clinic.   03/31/2021 - Patient undergoes CT Simulation   04/15/2021 - Start of proton therapy, C1D1 of cisplatin   04/21/2021 - C2D1 Cisplatin with protons   04/28/2021 - C3D1 cisplatin with protons   05/05/2021 - C4D1 Cisplatin with Protons   05/12/2021 - C5D1 Cisplatin with Protons   05/20/2021 - C6D1 Cisplatin with Protons   4/21-06/09/2021 - Patient presents to [facility] with sepsis, ultimately attributed to multifocal aspiration pneumonia in the setting of severe oropharyngeal dysphagia secondary to malignancy. While being intubated, the patient was noted to have   a yellow bumpy lesion on the right side of his epiglottis. He is eventually extubated, transitioned to PO levoquin, undergoes PEG tube placement on 4/26 and is discharged with recommendations for osmolite 1.5 bolus 200 mL 6  times a day.   06/15/2021 - Seen in med onc clinic for toxicity assessment, patient is doing well.   06/17/2021 - Patient is seen in ENT clinic (Rebecca [name]), with flexible laryngoscopy demonstrating no mucosal lesions with mobile Vocal cords and no residual yellowish bumpy nodule that was previously noted (possibly aspiration)     Interval History:   Patient presents with his wife. He notes that he is tolerating oral intake without any difficulties. He no longer requires any use of his tube feeds. Minimal pain symptoms, no opioid requirements.     Nausea remains completely resolved. Denies any constitutional symptoms such as fevers, night sweats, weight loss. Continues to make significant strides. Weight is now starting to improve. Stamina is improving.     Review of systems:  Completed a full review of systems, and all systems are negative other than per HPI.     ECOG performance status:   0 (Fully active, able to carry on all pre-disease performance without restriction)    Allergies:   Review of patient's allergies indicates:  No Known Allergies    Medications: No current outpatient medications on file.    Past Medical History:  cT1 cN1 squamous cell cancer of the right palatine tonsil, p16+     Surgical History   Np prior surgeries    Social history: No updates   Retired, previously worked in IT, does not smoke (never smoker), does not drink alcohol, lives with his wife Rebecca [name].     Family history: No updates   Father - Stomach Cancer at age 77, died at age 77   Mother - Colon cancer at age 70, died at age 92   Brother - Liver cancer at age 72, died at age 72    Vitals:   Vitals:    07/27/21 1402   Temp: 36.8 C   Pulse: 70   BP: 102/67   Resp: 16   SpO2: 99%   Weight: 50.5 kg (111 lb 5.3 oz)     Exam:   General: no acute distress, alert, oriented x3, well nourished, well developed  Head: normocephalic, atraumatic  Eyes: no scleral icterus, clear conjunctivae  Throat: moist mucus membranes, mucositis on soft  palate has improved, whitish coating on tongue has also improved in the interval period  Neck: Ongoing involution of right level II lymph node station mass which is no longer palpable.  CV: regular rate and rhythm, no murmurs   Pulm: no increased work of breathing, good air movement, clear to auscultation bilaterally, no wheezes/crackles  Abdomen: PEG tube in place, abdomen is soft, non-tender to palpation, non-distended   Extremities: no edema in lower extremities bilaterally  Neuro: no focal deficits  Psych: normal affect   Skin: no rashes, radiation dermatitis has resolved with minimal hyperpigmentation.     Labs:  Lab Results   Component Value Date    WBC 2.43 (L) 07/27/2021    RBC 2.82 (L) 07/27/2021    HEMOGLOBIN 9.3 (L) 07/27/2021    HEMATOCRIT 29 (L) 07/27/2021    MCV 102 (H) 07/27/2021    MCH 33.0 07/27/2021    MCHC 32.3 07/27/2021    PLATELET 195 07/27/2021    RDWCV 15.2 (H) 07/27/2021    PERNEUT 69 07/27/2021    PERLYMPH   14 07/27/2021    PERMONO 12 07/27/2021    PEREOS 5 07/27/2021    PERBASO 0 07/27/2021    PERIMG 0 07/27/2021    ANEUT 1.68 (L) 07/27/2021    ALYMPH 0.33 (L) 07/27/2021    AMONO 0.30 07/27/2021    AEOS 0.11 07/27/2021    ABASO 0.01 07/27/2021    AIMG 0.00 07/27/2021    ANRBC 0.00 07/27/2021    PERNRBC 0 07/27/2021     Lab Results   Component Value Date    SODIUM 141 07/27/2021    POTASSIUM 4.0 07/27/2021    CL 107 07/27/2021    CO2 28 07/27/2021    IONGAP 6 07/27/2021    GLUCOSE 113 07/27/2021    BUN 18 07/27/2021    CREATININE 1.03 07/27/2021    PROTEIN 6.1 07/27/2021    ALBUMIN 3.7 07/27/2021    BILIRUBN 0.3 07/27/2021    CA 8.9 07/27/2021    AST 14 07/27/2021    ALK 38 07/27/2021    ALT 9 (L) 07/27/2021    GFR >60 07/27/2021     Imaging:  Reviewed and summarized in HPI above.    Procedure Reports: Reviewed and summarized in the HPI above   Pathology: Reviewed and summarized in the HPI above.     Assessment and Plan: Rebecca [name] is a 56 year old with a history of cT1 cN1 p16+  squamous cell carcinoma of the right tonsil and right level 2 lymph node who presents for toxicity assessment after completing chemoradiation with protons.     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 Course complicated by multifocal aspiration pneumonia occurring after completion of radiation therapy. The patient is slowly recovering.   - Recommend follow-up in 4 weeks for interval toxicity assessment and imaging review (~08/27/2021)   - CT Neck Soft tissue with contrast 3 months post-therapy with follow-up in Radiation oncology + medical oncology clinic (CT ordered for 08/26/2021)     #Yellow bumpy lesion on epiglottis noted during intubation during hospitalization for multifocal pneumonia - resolved on subsequent ENT evaluation 06/17/2021   - Careful attention in follow-up at post-radiation laryngoscopy assessment by radiation oncology, will request this at subsequent follow-up visit in clinic in 4 weeks.     #Oropharyngeal dysphagia - improved, cleared after Barium Swallow Study     #Chemotherapy induced cytopenias  - Continue to monitor, patient is showing signs of delayed recovery, can take 8+ weeks to completely recover     #Thrush - improved   - s/p extensive Clotrimazole for about 4 weeks, now resolved     #Supportive Care   Nausea: Patient no longer requires anti-nausea medications. Patient was previously managed with the following:   Zofran 8mg PO q8h PRN N/V  Compazine 10mg PO q6h PRN N/V  Olanzapine 5mg PO qHS   Scopolamine Patch q72 hours   Pain: Patient no longer is using pain medication beyond tylenol. Patient was previously managed with the following:   Oxycodone 5 mg/5mL solution, 5mg pO q4h PRN Pain   Fentanyl patch 12 mcg/hr     Return to clinic: Patient to return in 4 weeks for interval re-assessment and imaging review with same day labs.     Nik  Kamat, MD  Hematology/Oncology   [facility]

## 2016-10-28 NOTE — Assessment & Plan Note (Signed)
Patient has duragesic patch on, uses oxycodone for breakthrough; was seen at pain clinic here locally; reviewed UDS with them (patient and husband), explained no opiates were present in her UDS; take care to safeguard medicines; I won't be prescribing any pain medicine for her; I encouraged her to contact her previous provider about new referral to pain clinic or she can go to a walk-in pain clinic
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
## 2016-10-28 NOTE — Assessment & Plan Note (Signed)
Patient to go to pain clinic

## 2016-11-02 DIAGNOSIS — G35 Multiple sclerosis: Secondary | ICD-10-CM | POA: Diagnosis not present

## 2016-11-02 DIAGNOSIS — M4802 Spinal stenosis, cervical region: Secondary | ICD-10-CM | POA: Diagnosis not present

## 2016-11-02 DIAGNOSIS — L988 Other specified disorders of the skin and subcutaneous tissue: Secondary | ICD-10-CM | POA: Diagnosis not present

## 2016-11-02 DIAGNOSIS — G40909 Epilepsy, unspecified, not intractable, without status epilepticus: Secondary | ICD-10-CM | POA: Diagnosis not present

## 2016-11-02 DIAGNOSIS — K589 Irritable bowel syndrome without diarrhea: Secondary | ICD-10-CM | POA: Diagnosis not present

## 2016-11-02 DIAGNOSIS — I890 Lymphedema, not elsewhere classified: Secondary | ICD-10-CM | POA: Diagnosis not present

## 2016-11-03 DIAGNOSIS — K589 Irritable bowel syndrome without diarrhea: Secondary | ICD-10-CM | POA: Diagnosis not present

## 2016-11-03 DIAGNOSIS — G40909 Epilepsy, unspecified, not intractable, without status epilepticus: Secondary | ICD-10-CM | POA: Diagnosis not present

## 2016-11-03 DIAGNOSIS — L988 Other specified disorders of the skin and subcutaneous tissue: Secondary | ICD-10-CM | POA: Diagnosis not present

## 2016-11-03 DIAGNOSIS — I890 Lymphedema, not elsewhere classified: Secondary | ICD-10-CM | POA: Diagnosis not present

## 2016-11-03 DIAGNOSIS — M4802 Spinal stenosis, cervical region: Secondary | ICD-10-CM | POA: Diagnosis not present

## 2016-11-03 DIAGNOSIS — G35 Multiple sclerosis: Secondary | ICD-10-CM | POA: Diagnosis not present

## 2016-11-04 ENCOUNTER — Inpatient Hospital Stay: Payer: Medicare Other | Attending: Oncology

## 2016-11-04 DIAGNOSIS — Z9011 Acquired absence of right breast and nipple: Secondary | ICD-10-CM | POA: Diagnosis not present

## 2016-11-04 DIAGNOSIS — Z95828 Presence of other vascular implants and grafts: Secondary | ICD-10-CM

## 2016-11-04 DIAGNOSIS — Z452 Encounter for adjustment and management of vascular access device: Secondary | ICD-10-CM | POA: Diagnosis not present

## 2016-11-04 DIAGNOSIS — G35 Multiple sclerosis: Secondary | ICD-10-CM | POA: Insufficient documentation

## 2016-11-04 DIAGNOSIS — Z171 Estrogen receptor negative status [ER-]: Secondary | ICD-10-CM | POA: Diagnosis not present

## 2016-11-04 DIAGNOSIS — Z853 Personal history of malignant neoplasm of breast: Secondary | ICD-10-CM | POA: Insufficient documentation

## 2016-11-04 DIAGNOSIS — Z9221 Personal history of antineoplastic chemotherapy: Secondary | ICD-10-CM | POA: Diagnosis not present

## 2016-11-04 DIAGNOSIS — Z79899 Other long term (current) drug therapy: Secondary | ICD-10-CM | POA: Insufficient documentation

## 2016-11-04 DIAGNOSIS — G629 Polyneuropathy, unspecified: Secondary | ICD-10-CM | POA: Diagnosis not present

## 2016-11-04 MED ORDER — SODIUM CHLORIDE 0.9% FLUSH
10.0000 mL | INTRAVENOUS | Status: AC | PRN
  Administered 2016-11-04: 10 mL
  Filled 2016-11-04: qty 10

## 2016-11-04 MED ORDER — HEPARIN SOD (PORK) LOCK FLUSH 100 UNIT/ML IV SOLN
500.0000 [IU] | INTRAVENOUS | Status: AC | PRN
  Administered 2016-11-04: 500 [IU]

## 2016-11-08 NOTE — Progress Notes (Signed)
Cardiology Office Note  Date:  11/09/2016   ID:  Rebecca [name], DOB [birth year], MRN [id number]  PCP:  [name], MD   Chief Complaint  Patient presents with  . other    12 month follow up. Meds reviewed by the pt. verbally. "doing well."     HPI:  Rebecca [name] is a 56 y.o. female with a PMH of chronic LE edema (mostly related to Lymphedema).   multiple sclerosis with muscle atrophy of lower extremities.  Uses a walker secondary to leg weakness, as well as a wheelchair uses Unaboot wraps for mechanical compression since 05/2016 She has compression pumps but does not use these very much breast cancer and had a right mastectomy with lymph node dissection.  She completed chemotherapy  Who presents for follow-up of her leg swelling  Previous studies reviewed with her Echocardiogram 03/2016 Normal LV function Mild AI, MR, borderline elevated right heart pressures No change from previous echo  Lab work reviewed Total cholesterol 214  Continues to have leg wraps placed once per week since April 2018 Denies any sores or skin breakdown, no ulcerations or blistering of the lower extremities No regular exercise program  EKG personally reviewed by myself on todays visit Shows no sinus rhythm rate 75 bpm no significant ST or T-wave changes    PMH:   has a past medical history of Aortic valve disease; Bacterial endocarditis; Bilateral lower extremity edema; Breast cancer ([place]) (12-31-13); Cervical stenosis of spine; Herpes zoster; IBS (irritable bowel syndrome); Lymphedema; Multiple sclerosis ([place]) (2001); Neuromuscular disorder ([place]); Seizures ([place]); and Syncope and collapse.  PSH:    Past Surgical History:  Procedure Laterality Date  . ANKLE SURGERY     Left  . ANKLE SURGERY    . ANTERIOR CERVICAL DECOMP/DISCECTOMY FUSION  11/17/2011   Procedure: ANTERIOR CERVICAL DECOMPRESSION/DISCECTOMY FUSION 2 LEVELS;  Surgeon: Erline Levine, MD;  Location: [facility];  Service:  Neurosurgery;  Laterality: N/A;  Cervical Five-Six Six-Seven Anterior cervical decompression/diskectomy/fusion  . BREAST BIOPSY Right 12-31-13   invasive mammary  . BREAST SURGERY Right 02/03/2014   Right simple mastectomy with sentinel node biopsy.  . CHOLECYSTECTOMY    . COLONOSCOPY  2014  . Lower extremity venous Dopplers  Feb 27, 2013   No LE DVT  . MASTECTOMY    . Port a cath insertion Right 01/19/2010  . PORT-A-CATH REMOVAL     right  . PORT-A-CATH REMOVAL Right 09/03/2013   Procedure: REMOVAL PORT-A-CATH;  Surgeon: Conrad Ple[place], MD;  Location: [facility];  Service: Vascular;  Laterality: Right;  . TRANSTHORACIC ECHOCARDIOGRAM  03/2013; 02/2014   a) Normal LV size and function with EF 60-65%.; Cannot exclude bicuspid aortic valve with mild AS and mild AI.; b) Normal EF with normal wall motion and valve function x Mild MR. G2 DD. EF 60-65%. Tricuspid AoV  . UPPER GI ENDOSCOPY  2014    Current Outpatient Prescriptions  Medication Sig Dispense Refill  . amantadine (SYMMETREL) 100 MG capsule Take 1 capsule (100 mg total) by mouth 3 (three) times daily. 270 capsule 4  . baclofen (LIORESAL) 10 MG tablet TAKE 1 TO 2 TABLETS BY MOUTH 3 TIMES A DAY 270 tablet 4  . fentaNYL (DURAGESIC - DOSED MCG/HR) 50 MCG/HR Place 1 patch (50 mcg total) onto the skin every 3 (three) days. 10 patch 0  . glucosamine-chondroitin 500-400 MG tablet Take 1 tablet by mouth 4 (four) times daily.    Marland Kitchen ibuprofen (ADVIL,MOTRIN) 600 MG tablet  Take 1 tablet (600 mg total) by mouth every 8 (eight) hours as needed. 60 tablet 5  . Incontinence Supply Disposable (PREVAIL WET WIPES) MISC Apply 1 each topically daily as needed. (dx neurogenic bladder N31.9, LON 99 months) 96 each 11  . Interferon Beta-1a (REBIF REBIDOSE) 44 MCG/0.5ML SOAJ Inject 0.5 mLs into the skin 3 (three) times a week. (Patient taking differently: Inject 0.5 mLs into the skin 3 (three) times a week. Monday, Wednesday and Friday) 18 mL 4  . levETIRAcetam  (KEPPRA) 500 MG tablet Take 1 tablet (500 mg total) by mouth 2 (two) times daily. 180 tablet 4  . Multiple Vitamins-Minerals (MULTIVITAMIN PO) Take 1 tablet by mouth 2 (two) times daily.     Marland Kitchen oxybutynin (DITROPAN-XL) 10 MG 24 hr tablet TAKE 2 TABLETS BY MOUTH DAILY 180 tablet 3   No current facility-administered medications for this visit.      Allergies:   Fentanyl and Sulfa antibiotics   Social History:  The patient  reports that she has never smoked. She has never used smokeless tobacco. She reports that she does not drink alcohol or use drugs.   Family History:   family history includes Breast cancer (age of onset: 72) in her maternal aunt; Breast cancer (age of onset: 64) in her maternal grandmother; Cancer in her father; Heart attack in her father; Heart disease in her father; Ovarian cancer in her cousin; Thyroid disease in her sister.    Review of Systems: Review of Systems  Constitutional: Negative.   Respiratory: Negative.   Cardiovascular: Positive for leg swelling.  Gastrointestinal: Negative.   Musculoskeletal: Negative.   Neurological: Negative.   Psychiatric/Behavioral: Negative.   All other systems reviewed and are negative.    PHYSICAL EXAM: VS:  BP 128/70 (BP Location: Right Arm, Patient Position: Sitting, Cuff Size: Normal)   Pulse 75   Ht 5\' 4"  (1.626 m)   Wt 170 lb (77.1 kg)   BMI 29.18 kg/m  , BMI Body mass index is 29.18 kg/m. GEN: Well nourished, well developed, in no acute distress  HEENT: normal  Neck: no JVD, carotid bruits, or masses Cardiac: RRR; no murmurs, rubs, or gallops,no edema  Respiratory:  clear to auscultation bilaterally, normal work of breathing GI: soft, nontender, nondistended, + BS MS: no deformity or atrophy  Skin: warm and dry, no rash Neuro:  Strength and sensation are intact Psych: euthymic mood, full affect    Recent Labs: 11/25/2015: TSH 1.83 06/27/2016: Hemoglobin 12.4; Platelets 163 07/28/2016: ALT 22; BUN 12;  Creatinine, Ser 0.55; Magnesium 1.9; Potassium 4.5; Sodium 143    Lipid Panel Lab Results  Component Value Date   CHOL 214 (H) 11/25/2015   HDL 58 11/25/2015   LDLCALC 134 (H) 11/25/2015   TRIG 109 11/25/2015      Wt Readings from Last 3 Encounters:  11/09/16 170 lb (77.1 kg)  10/28/16 170 lb (77.1 kg)  07/28/16 170 lb (77.1 kg)       ASSESSMENT AND PLAN:  Bilateral lower extremity edema - Plan: EKG 12-Lead By her report she requested leg wraps in April and has been doing this once per week Had difficulty with compression pumps secondary to urinary urgency, had to get the pumps off quickly. Will defer to her whether she would like to keep doing wraps or retry the lymphedema compression pumps as she has no ulcerations, blistering   SOB (shortness of breath) - Plan: EKG 12-Lead We have encouraged continued exercise, careful diet management in an  effort to lose weight.  MS (multiple sclerosis) ([place]) - Plan: EKG 12-Lead Reports her symptoms are stable  Lymphedema Long discussion, delayed diagnosis coming from Rebecca [name] comfortable with her wraps, may retry the lymphedema compression pumps  Disposition:   F/U  12 months as needed   Total encounter time more than 25 minutes  Greater than 50% was spent in counseling and coordination of care with the patient    Orders Placed This Encounter  Procedures  . EKG 12-Lead     Signed, Esmond Plants, M.D., Ph.D. 11/09/2016  [place], [place]

## 2016-11-09 ENCOUNTER — Ambulatory Visit (INDEPENDENT_AMBULATORY_CARE_PROVIDER_SITE_OTHER): Payer: Medicare Other | Admitting: Cardiovascular Disease

## 2016-11-09 ENCOUNTER — Encounter: Payer: Self-pay | Admitting: Cardiovascular Disease

## 2016-11-09 VITALS — BP 128/70 | HR 75 | Ht 64.0 in | Wt 170.0 lb

## 2016-11-09 DIAGNOSIS — R600 Localized edema: Secondary | ICD-10-CM | POA: Diagnosis not present

## 2016-11-09 DIAGNOSIS — I890 Lymphedema, not elsewhere classified: Secondary | ICD-10-CM | POA: Diagnosis not present

## 2016-11-09 DIAGNOSIS — R0602 Shortness of breath: Secondary | ICD-10-CM

## 2016-11-09 DIAGNOSIS — G35 Multiple sclerosis: Secondary | ICD-10-CM | POA: Diagnosis not present

## 2016-11-09 NOTE — Patient Instructions (Signed)
If you need help with lymphedema, Call Dr. Lucky Cowboy and Ronalee Belts   Medication Instructions:   No medication changes made  Labwork:  No new labs needed  Testing/Procedures:  No further testing at this time   Follow-Up: It was a pleasure seeing you in the office today. Please call us if you have new issues that need to be addressed before your next appt.  [phone]  Your physician wants you to follow-up in: 12 months as needed You will receive a reminder letter in the mail two months in advance. If you don't receive a letter, please call our office to schedule the follow-up appointment.  If you need a refill on your cardiac medications before your next appointment, please call your pharmacy.

## 2016-11-10 ENCOUNTER — Telehealth: Payer: Self-pay | Admitting: Family Medicine

## 2016-11-10 DIAGNOSIS — I890 Lymphedema, not elsewhere classified: Secondary | ICD-10-CM | POA: Diagnosis not present

## 2016-11-10 DIAGNOSIS — G40909 Epilepsy, unspecified, not intractable, without status epilepticus: Secondary | ICD-10-CM | POA: Diagnosis not present

## 2016-11-10 DIAGNOSIS — M4802 Spinal stenosis, cervical region: Secondary | ICD-10-CM | POA: Diagnosis not present

## 2016-11-10 DIAGNOSIS — K589 Irritable bowel syndrome without diarrhea: Secondary | ICD-10-CM | POA: Diagnosis not present

## 2016-11-10 DIAGNOSIS — G35 Multiple sclerosis: Secondary | ICD-10-CM | POA: Diagnosis not present

## 2016-11-10 DIAGNOSIS — L988 Other specified disorders of the skin and subcutaneous tissue: Secondary | ICD-10-CM | POA: Diagnosis not present

## 2016-11-10 NOTE — Telephone Encounter (Signed)
That's fine thank you

## 2016-11-10 NOTE — Telephone Encounter (Signed)
[facility] notified

## 2016-11-10 NOTE — Telephone Encounter (Signed)
Rebecca [name] from [facility] Wrapped leg with unna boot states presently the skin in tack, no redness, no wound, no edema. Would you like for them to continue with compression stocking and be discharged. [phone]

## 2016-11-11 ENCOUNTER — Encounter: Payer: Medicare Other | Admitting: Physical Medicine & Rehabilitation

## 2016-11-24 ENCOUNTER — Encounter: Payer: Medicare Other | Admitting: Physical Medicine & Rehabilitation

## 2016-11-29 ENCOUNTER — Encounter: Payer: Medicare Other | Admitting: Family Medicine

## 2016-12-15 ENCOUNTER — Encounter: Payer: Self-pay | Admitting: Physical Medicine & Rehabilitation

## 2016-12-15 ENCOUNTER — Encounter: Payer: Medicare Other | Attending: Physical Medicine & Rehabilitation | Admitting: Physical Medicine & Rehabilitation

## 2016-12-15 VITALS — BP 141/81 | HR 68

## 2016-12-15 DIAGNOSIS — K592 Neurogenic bowel, not elsewhere classified: Secondary | ICD-10-CM

## 2016-12-15 DIAGNOSIS — Z79899 Other long term (current) drug therapy: Secondary | ICD-10-CM | POA: Insufficient documentation

## 2016-12-15 DIAGNOSIS — G801 Spastic diplegic cerebral palsy: Secondary | ICD-10-CM | POA: Diagnosis not present

## 2016-12-15 DIAGNOSIS — Z9011 Acquired absence of right breast and nipple: Secondary | ICD-10-CM | POA: Diagnosis not present

## 2016-12-15 DIAGNOSIS — K589 Irritable bowel syndrome without diarrhea: Secondary | ICD-10-CM | POA: Insufficient documentation

## 2016-12-15 DIAGNOSIS — G35 Multiple sclerosis: Secondary | ICD-10-CM | POA: Insufficient documentation

## 2016-12-15 DIAGNOSIS — N3644 Muscular disorders of urethra: Secondary | ICD-10-CM | POA: Diagnosis not present

## 2016-12-15 DIAGNOSIS — Z9221 Personal history of antineoplastic chemotherapy: Secondary | ICD-10-CM | POA: Insufficient documentation

## 2016-12-15 DIAGNOSIS — I890 Lymphedema, not elsewhere classified: Secondary | ICD-10-CM | POA: Insufficient documentation

## 2016-12-15 DIAGNOSIS — G40909 Epilepsy, unspecified, not intractable, without status epilepticus: Secondary | ICD-10-CM | POA: Insufficient documentation

## 2016-12-15 DIAGNOSIS — Z808 Family history of malignant neoplasm of other organs or systems: Secondary | ICD-10-CM | POA: Insufficient documentation

## 2016-12-15 DIAGNOSIS — N319 Neuromuscular dysfunction of bladder, unspecified: Secondary | ICD-10-CM | POA: Diagnosis not present

## 2016-12-15 DIAGNOSIS — Z8349 Family history of other endocrine, nutritional and metabolic diseases: Secondary | ICD-10-CM | POA: Insufficient documentation

## 2016-12-15 DIAGNOSIS — Z8249 Family history of ischemic heart disease and other diseases of the circulatory system: Secondary | ICD-10-CM | POA: Diagnosis not present

## 2016-12-15 DIAGNOSIS — G8929 Other chronic pain: Secondary | ICD-10-CM | POA: Insufficient documentation

## 2016-12-15 DIAGNOSIS — F4323 Adjustment disorder with mixed anxiety and depressed mood: Secondary | ICD-10-CM | POA: Diagnosis not present

## 2016-12-15 DIAGNOSIS — Z853 Personal history of malignant neoplasm of breast: Secondary | ICD-10-CM | POA: Diagnosis not present

## 2016-12-15 NOTE — Progress Notes (Signed)
Subjective:    Patient ID: Rebecca [name], female    DOB: Aug 25, 1960, 56 y.o.   MRN: [id number]  HPI 56 year old right-handed female, history of chronic pain, lymphedema bilateral lower extremities, right breast cancer, chemotherapy, seizure disorder, multiple sclerosis presents for follow up for MS.  Last clinic visit 07/15/16.  Husband present, who provides history. Since last visit, pt states she is following up with Neuro.  She notes improvement in bowel/bladder.  Husband notes involuntary oral movements.  She is following up for lymphedema.   Pain Inventory Average Pain 9 Pain Right Now 0 My pain is intermittent, sharp, burning, tingling and aching  In the last 24 hours, has pain interfered with the following? General activity 0 Relation with others 0 Enjoyment of life 0 What TIME of day is your pain at its worst? night, night Sleep (in general) Good  Pain is worse with: sitting and inactivity Pain improves with: medication Relief from Meds: 10  Mobility walk with assistance use a walker how many minutes can you walk? 5 ability to climb steps?  no do you drive?  no Do you have any goals in this area?  yes  Function not employed: date last employed . disabled: date disabled . I need assistance with the following:  household duties and shopping Do you have any goals in this area?  no  Neuro/Psych bladder control problems weakness numbness tingling trouble walking spasms dizziness  Prior Studies hospital f/u  Physicians involved in your care hospital f/u   Family History  Problem Relation Age of Onset  . Cancer Father        skin  . Heart disease Father   . Heart attack Father        heart attack in his 29's  . Thyroid disease Sister   . Ovarian cancer Cousin   . Breast cancer Maternal Aunt 60  . Breast cancer Maternal Grandmother 74   Social History   Socioeconomic History  . Marital status: Married    Spouse name: don  . Number of children:  0  . Years of education: 37  . Highest education level: None  Social Needs  . Financial resource strain: None  . Food insecurity - worry: None  . Food insecurity - inability: None  . Transportation needs - medical: None  . Transportation needs - non-medical: None  Occupational History  . Occupation: disability  Tobacco Use  . Smoking status: Never Smoker  . Smokeless tobacco: Never Used  Substance and Sexual Activity  . Alcohol use: No  . Drug use: No  . Sexual activity: Not Currently    Partners: Male  Other Topics Concern  . None  Social History Narrative   She is married. Recently moved back to [place] after being in [place] for some time. She is accompanied by her husband and aunt.   Never smoked. Never used alcohol.   Past Surgical History:  Procedure Laterality Date  . ANKLE SURGERY     Left  . ANKLE SURGERY    . BREAST BIOPSY Right 12-31-13   invasive mammary  . BREAST SURGERY Right 02/03/2014   Right simple mastectomy with sentinel node biopsy.  . CHOLECYSTECTOMY    . COLONOSCOPY  2014  . Lower extremity venous Dopplers  Feb 27, 2013   No LE DVT  . MASTECTOMY    . Port a cath insertion Right 01/19/2010  . PORT-A-CATH REMOVAL     right  . TRANSTHORACIC ECHOCARDIOGRAM  03/2013; 02/2014  a) Normal LV size and function with EF 60-65%.; Cannot exclude bicuspid aortic valve with mild AS and mild AI.; b) Normal EF with normal wall motion and valve function x Mild MR. G2 DD. EF 60-65%. Tricuspid AoV  . UPPER GI ENDOSCOPY  2014   Past Medical History:  Diagnosis Date  . Aortic valve disease    Mild AS / AI - most recent Echo demonstrated tricuspid aortic valve.  . Bacterial endocarditis    History of .  Marland Kitchen Bilateral lower extremity edema    Noncardiac.  Chronic. LE Venous dopplers - negative for DVT.; Echocardiogram January 2016: Normal EF with normal wall motion and valve function. Only grade 1 diastolic dysfunction. EF 60-65%. Mild MR  . Breast cancer  ([place]) 12-31-13   Right breast, 12:00, 1.5 cm, T1c,N0 invasive mammary carcinoma, triple negative. --> Rx with Chemo  . Cervical stenosis of spine   . Herpes zoster   . IBS (irritable bowel syndrome)   . Lymphedema    has legs [facility] at [facility]  . Multiple sclerosis ([place]) 2001   Walks from room to room @ home; but Wheelchair when going out.  Marland Kitchen Neuromuscular disorder ([place])    MS  . Seizures ([place])    Takes Keppra  . Syncope and collapse    BP (!) 141/81   Pulse 68   SpO2 98%   Opioid Risk Score:   Fall Risk Score:  `1  Depression screen PHQ 2/9  Depression screen [facility] 2/9 10/28/2016 07/28/2016 07/15/2016 06/27/2016 11/25/2015 08/25/2015 10/24/2014  Decreased Interest - 0 2 0 0 0 0  Down, Depressed, Hopeless 0 0 0 0 0 0 0  PHQ - 2 Score 0 0 2 0 0 0 0  Altered sleeping - - 0 - - - -  Tired, decreased energy - - 0 - - - -  Change in appetite - - 0 - - - -  Feeling bad or failure about yourself  - - 0 - - - -  Trouble concentrating - - 0 - - - -  Moving slowly or fidgety/restless - - 0 - - - -  Suicidal thoughts - - 0 - - - -  PHQ-9 Score - - 2 - - - -  Difficult doing work/chores - - Somewhat difficult - - - -  Some recent data might be hidden    Review of Systems  HENT: Negative.   Eyes: Negative.   Respiratory: Negative.   Cardiovascular: Negative.   Gastrointestinal: Negative.   Endocrine: Negative.   Genitourinary: Positive for difficulty urinating.  Musculoskeletal: Positive for arthralgias and gait problem.       Spasms  Allergic/Immunologic: Negative.   Neurological: Positive for dizziness, weakness and numbness.       Tingling  Hematological: Negative.   Psychiatric/Behavioral: Negative.   All other systems reviewed and are negative.      Objective:   Physical Exam Gen: Well developed. NAD. Vital signs reviewed. Head: Normocephalic. Atraumatic. Eyes: EOMI. No discharge Cardiovascular: RRR. No JVD.  Respiratory: CTA B. Unlabored. GI: Soft. Bowel sounds are  normal.  Musc: Edema b/l LE. Right heel cord contracture Neurological. Alert and oriented  Fair awareness of deficits.  Motor: RUE: 4+/5 proximal to distal  LUE: 4+/5 proximal to distal RLE: 3-/5 HF, 3+/5 KE, 2-/5 ADF/PF  LLE: 3/5 HF, 4-/5 KE, 4-/5 ADF/PF  Skin. Chronic lymphedema lower extremities with dressing over both legs.  Psych: pleasant and cooperative    Assessment & Plan:  56 year old right-handed female, history of chronic pain, lymphedema bilateral lower extremities, right breast cancer, chemotherapy, seizure disorder, multiple sclerosis presents for follow up for MS.   1. MS   Cont meds   Cont follow up with Neurology  2. Pain Management/chronic pain  Cont meds per Neurology and PCP  Husband with concerns due to receiving pain meds  3. Neurogenic bladder/DSD.   Cont meds  4. Seizure disorder  Cont meds. Keppra 500 mg twice a day  5.  Spastic diplegia:   Cont meds  Relatively controlled at present  Cont ROM  Will consider Botox injection in the future if appropriate, not needed at present  6. Chronic lymphedema  Cont wraps  Cont therapy  > 35 minutes spent with patient with >25 minutes spent discussing concerns regarding bowel/bladder, pain meds

## 2016-12-31 ENCOUNTER — Other Ambulatory Visit: Payer: Self-pay | Admitting: Diagnostic Neuroimaging

## 2017-01-04 ENCOUNTER — Inpatient Hospital Stay: Payer: Medicare Other

## 2017-01-06 ENCOUNTER — Inpatient Hospital Stay: Payer: Medicare Other

## 2017-01-10 ENCOUNTER — Inpatient Hospital Stay: Payer: Medicare Other

## 2017-01-17 ENCOUNTER — Encounter: Payer: Self-pay | Admitting: *Deleted

## 2017-01-17 ENCOUNTER — Emergency Department
Admission: EM | Admit: 2017-01-17 | Discharge: 2017-01-17 | Disposition: A | Discharge status: Home / Self Care | Payer: Medicare Other | Attending: Emergency Medicine | Admitting: Emergency Medicine

## 2017-01-17 ENCOUNTER — Emergency Department: Payer: Medicare Other

## 2017-01-17 ENCOUNTER — Other Ambulatory Visit: Payer: Self-pay

## 2017-01-17 DIAGNOSIS — G35 Multiple sclerosis: Secondary | ICD-10-CM | POA: Diagnosis not present

## 2017-01-17 DIAGNOSIS — N3001 Acute cystitis with hematuria: Secondary | ICD-10-CM | POA: Diagnosis not present

## 2017-01-17 DIAGNOSIS — M5126 Other intervertebral disc displacement, lumbar region: Secondary | ICD-10-CM | POA: Diagnosis not present

## 2017-01-17 DIAGNOSIS — Z79899 Other long term (current) drug therapy: Secondary | ICD-10-CM | POA: Insufficient documentation

## 2017-01-17 DIAGNOSIS — G8929 Other chronic pain: Secondary | ICD-10-CM | POA: Diagnosis not present

## 2017-01-17 DIAGNOSIS — R339 Retention of urine, unspecified: Secondary | ICD-10-CM | POA: Diagnosis not present

## 2017-01-17 DIAGNOSIS — R531 Weakness: Secondary | ICD-10-CM | POA: Diagnosis not present

## 2017-01-17 DIAGNOSIS — M6281 Muscle weakness (generalized): Secondary | ICD-10-CM | POA: Diagnosis not present

## 2017-01-17 LAB — URINALYSIS, COMPLETE (UACMP) WITH MICROSCOPIC
Bilirubin Urine: NEGATIVE
GLUCOSE, UA: NEGATIVE mg/dL
Ketones, ur: NEGATIVE mg/dL
NITRITE: NEGATIVE
Protein, ur: 100 mg/dL — AB
Specific Gravity, Urine: 1.011 (ref 1.005–1.030)
pH: 7 (ref 5.0–8.0)

## 2017-01-17 LAB — CBC
HCT: 38.6 % (ref 35.0–47.0)
HEMOGLOBIN: 13 g/dL (ref 12.0–16.0)
MCH: 30.4 pg (ref 26.0–34.0)
MCHC: 33.7 g/dL (ref 32.0–36.0)
MCV: 90.2 fL (ref 80.0–100.0)
Platelets: 129 10*3/uL — ABNORMAL LOW (ref 150–440)
RBC: 4.28 MIL/uL (ref 3.80–5.20)
RDW: 13 % (ref 11.5–14.5)
WBC: 9.5 10*3/uL (ref 3.6–11.0)

## 2017-01-17 LAB — BASIC METABOLIC PANEL
ANION GAP: 12 (ref 5–15)
BUN: 12 mg/dL (ref 6–20)
CHLORIDE: 102 mmol/L (ref 101–111)
CO2: 23 mmol/L (ref 22–32)
Calcium: 9.3 mg/dL (ref 8.9–10.3)
Creatinine, Ser: 0.57 mg/dL (ref 0.44–1.00)
GFR calc non Af Amer: >60 mL/min (ref >60)
Glucose, Bld: 132 mg/dL — ABNORMAL HIGH (ref 65–99)
POTASSIUM: 3.6 mmol/L (ref 3.5–5.1)
SODIUM: 137 mmol/L (ref 135–145)

## 2017-01-17 MED ORDER — ONDANSETRON HCL 4 MG/2ML IJ SOLN
4.0000 mg | Freq: Once | INTRAMUSCULAR | Status: AC
  Administered 2017-01-17: 4 mg via INTRAVENOUS
  Filled 2017-01-17: qty 2

## 2017-01-17 MED ORDER — MORPHINE SULFATE (PF) 4 MG/ML IV SOLN
4.0000 mg | Freq: Once | INTRAVENOUS | Status: AC
  Administered 2017-01-17: 4 mg via INTRAVENOUS
  Filled 2017-01-17: qty 1

## 2017-01-17 MED ORDER — LIDOCAINE 5 % EX PTCH: 1.0000 | MEDICATED_PATCH | Freq: Two times a day (BID) | CUTANEOUS | 0 refills | Status: DC

## 2017-01-17 MED ORDER — PREDNISONE 10 MG (21) PO TBPK: ORAL_TABLET | ORAL | 0 refills | Status: DC

## 2017-01-17 MED ORDER — CEPHALEXIN 500 MG PO CAPS: 500.0000 mg | ORAL_CAPSULE | Freq: Four times a day (QID) | ORAL | 0 refills | Status: AC

## 2017-01-17 MED ORDER — CEFTRIAXONE SODIUM IN DEXTROSE 20 MG/ML IV SOLN
1.0000 g | Freq: Once | INTRAVENOUS | Status: DC
  Filled 2017-01-17: qty 50

## 2017-01-17 MED ORDER — DEXTROSE 5 % IV SOLN
1.0000 g | Freq: Once | INTRAVENOUS | Status: AC
  Administered 2017-01-17: 1 g via INTRAVENOUS
  Filled 2017-01-17: qty 10

## 2017-01-17 MED ORDER — METHYLPREDNISOLONE SODIUM SUCC 125 MG IJ SOLR
125.0000 mg | Freq: Once | INTRAMUSCULAR | Status: AC
  Administered 2017-01-17: 125 mg via INTRAVENOUS
  Filled 2017-01-17: qty 2

## 2017-01-17 NOTE — ED Notes (Signed)
Pt says she's had multiple falls recently which is not normal for her; history of MS and usually gets around with a walker; reports urinary frequency in the last few days; denies dysuria or fever; foley catheter placed already noted to have cloudy yellow urine in tubing; pt also reports pain to her left jaw line that radiates up into her left ear; throbbing at times; dry mouth; talking in complete coherent sentences

## 2017-01-17 NOTE — ED Provider Notes (Signed)
[facility] Emergency Department Provider Note   ____________________________________________   First MD Initiated Contact with Patient 01/17/17 0154     (approximate)  I have reviewed the triage vital signs and the nursing notes.   HISTORY  Chief Complaint Extremity Weakness    HPI Rebecca [name] is a 56 y.o. female who comes into the hospital today with some leg numbness and urinary retention.  The patient states that she fell a couple of days ago on her bottom.  Her husband could not get her up so EMS was called and they help to pick her up off of the floor.  The patient states though that she did not come into the hospital.  She now though has developed some bilateral lower extremity weakness and urinary retention.  She denies any pain at this time.  The patient has a history of MS and has frequent falls.  She reports that she fell backwards but did not hit her head.  She reports that she just feels more weak and can barely hold up her weight on her 2 legs.  She is also had some difficulty urinating when she sits on the toilet.  The patient states that she feels like she has to go but she is unable to do so.  She is here today for evaluation of her symptoms.   Past Medical History:  Diagnosis Date  . Aortic valve disease    Mild AS / AI - most recent Echo demonstrated tricuspid aortic valve.  . Bacterial endocarditis    History of .  Marland Kitchen Bilateral lower extremity edema    Noncardiac.  Chronic. LE Venous dopplers - negative for DVT.; Echocardiogram January 2016: Normal EF with normal wall motion and valve function. Only grade 1 diastolic dysfunction. EF 60-65%. Mild MR  . Breast cancer ([place]) 12-31-13   Right breast, 12:00, 1.5 cm, T1c,N0 invasive mammary carcinoma, triple negative. --> Rx with Chemo  . Cervical stenosis of spine   . Herpes zoster   . IBS (irritable bowel syndrome)   . Lymphedema    has legs wrapped at [facility]  . Multiple sclerosis ([place])  2001   Walks from room to room @ home; but Wheelchair when going out.  Marland Kitchen Neuromuscular disorder ([place])    MS  . Seizures ([place])    Takes Keppra  . Syncope and collapse     Patient Active Problem List   Diagnosis Date Noted  . Bilateral lower extremity pain (primary) (bilateral) (right greater than left) 08/01/2016  . Chronic neck pain (secondary) (bilateral) ( left greater than right) 07/28/2016  . Long term current use of opiate analgesic 07/28/2016  . Long term prescription opiate use 07/28/2016  . Opiate use 07/28/2016  . Neutropenia ([place]) 06/27/2016  . Chronic pain syndrome 06/03/2016  . Adjustment disorder with mixed anxiety and depressed mood   . Neurogenic bladder   . Neurogenic bowel   . Detrusor and sphincter dyssynergia   . Urinary incontinence   . Seizure disorder ([place])   . Slow transit constipation   . 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 Lymphedema   . Hypoalbuminemia due to protein-calorie malnutrition ([place])   . Acute blood loss anemia   . Encounter for screening for cervical cancer  11/27/2015  . Preventative health care 11/25/2015  . History of breast cancer in female 10/28/2015  . Anemia 10/28/2015  . Thrombocytopenia ([place]) 10/28/2015  . Allergic rhinitis 10/28/2015  . IBS (irritable bowel syndrome) 10/28/2015  .  Encounter for eye exam 08/25/2015  . 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 History of right mastectomy 10/24/2014  . Need for immunization against influenza 10/24/2014  . Medicare annual wellness visit, subsequent 10/24/2014  . MS (multiple sclerosis) ([place]) 09/16/2014  . Hematoma complicating a procedure 03/06/2014  . Primary cancer of right female breast ([place]) 01/10/2014  . SOB (shortness of breath) 03/01/2013  . Bilateral lower extremity edema   . 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 ANKLE SURGERY     Left  . ANKLE SURGERY    . ANTERIOR CERVICAL DECOMP/DISCECTOMY FUSION  11/17/2011    Procedure: ANTERIOR CERVICAL DECOMPRESSION/DISCECTOMY FUSION 2 LEVELS;  Surgeon: Erline Levine, MD;  Location: [facility];  Service: Neurosurgery;  Laterality: N/A;  Cervical Five-Six Six-Seven Anterior cervical decompression/diskectomy/fusion  . BREAST BIOPSY Right 12-31-13   invasive mammary  . BREAST SURGERY Right 02/03/2014   Right simple mastectomy with sentinel node biopsy.  . CHOLECYSTECTOMY    . COLONOSCOPY  2014  . Lower extremity venous Dopplers  Feb 27, 2013   No LE DVT  . MASTECTOMY    . Port a cath insertion Right 01/19/2010  . PORT-A-CATH REMOVAL     right  . PORT-A-CATH REMOVAL Right 09/03/2013   Procedure: REMOVAL PORT-A-CATH;  Surgeon: Conrad Sumner, MD;  Location: [facility];  Service: Vascular;  Laterality: Right;  . TRANSTHORACIC ECHOCARDIOGRAM  03/2013; 02/2014   a) Normal LV size and function with EF 60-65%.; Cannot exclude bicuspid aortic valve with mild AS and mild AI.; b) Normal EF with normal wall motion and valve function x Mild MR. G2 DD. EF 60-65%. Tricuspid AoV  . UPPER GI ENDOSCOPY  2014    Prior to Admission medications   Medication Sig Start Date End Date Taking? Authorizing Provider  amantadine (SYMMETREL) 100 MG capsule Take 1 capsule (100 mg total) by mouth 3 (three) times daily. 08/01/16   Penumalli, Earlean Polka, MD  baclofen (LIORESAL) 10 MG tablet TAKE 1 TO 2 TABLETS BY MOUTH 3 TIMES A DAY 08/29/16   Penumalli, Earlean Polka, MD  cephALEXin (KEFLEX) 500 MG capsule Take 1 capsule (500 mg total) by mouth 4 (four) times daily for 10 days. 01/17/17 01/27/17  Loney Hering, MD  fentaNYL (DURAGESIC - DOSED MCG/HR) 50 MCG/HR Place 1 patch (50 mcg total) onto the skin every 3 (three) days. 10/04/16   Lloyd Huger, MD  glucosamine-chondroitin 500-400 MG tablet Take 1 tablet by mouth 4 (four) times daily.    [provider]  ibuprofen (ADVIL,MOTRIN) 600 MG tablet Take 1 tablet (600 mg total) by mouth every 8 (eight) hours as needed. 10/28/16   [name],  MD  Incontinence Supply Disposable (PREVAIL WET WIPES) MISC Apply 1 each topically daily as needed. (dx neurogenic bladder N31.9, LON 99 months) 07/25/16   Lada, Satira Anis, MD  Interferon Beta-1a (REBIF REBIDOSE) 44 MCG/0.5ML SOAJ Inject 0.5 mLs into the skin 3 (three) times a week. Patient taking differently: Inject 0.5 mLs into the skin 3 (three) times a week. Monday, Wednesday and Friday 05/13/16   Penumalli, Earlean Polka, MD  levETIRAcetam (KEPPRA) 500 MG tablet TAKE 1 TABLET (500 MG TOTAL) BY MOUTH 2 (TWO) TIMES DAILY. 01/03/17   Penumalli, Earlean Polka, MD  lidocaine (LIDODERM) 5 % Place 1 patch onto the skin every 12 (twelve) hours. Remove & Discard patch within 12 hours or as directed by MD 01/17/17 01/17/18  Charlesetta Ivory  P, MD  Multiple Vitamins-Minerals (MULTIVITAMIN PO) Take 1 tablet by mouth 2 (two) times daily.     [provider]  oxybutynin (DITROPAN-XL) 10 MG 24 hr tablet TAKE 2 TABLETS BY MOUTH DAILY 07/21/16   Penumalli, Earlean Polka, MD  predniSONE (STERAPRED UNI-PAK 21 TAB) 10 MG (21) TBPK tablet Take 6 tabs on day 1 Take 5 tabs on day 2 Take 4 tabs on day 3 Take 3 tabs on day 4 Take 2 tabs on day 5 Take 1 tab on day 6 01/17/17   Loney Hering, MD    Allergies Fentanyl and Sulfa antibiotics  Family History  Problem Relation Age of Onset  . Cancer Father        skin  . Heart disease Father   . Heart attack Father        heart attack in his 29's  . Thyroid disease Sister   . Ovarian cancer Cousin   . Breast cancer Maternal Aunt 60  . Breast cancer Maternal Grandmother 74    Social History Social History   Tobacco Use  . Smoking status: Never Smoker  . Smokeless tobacco: Never Used  Substance Use Topics  . Alcohol use: No  . Drug use: No    Review of Systems  Constitutional: No fever/chills Eyes: No visual changes. ENT: No sore throat. Cardiovascular: Denies chest pain. Respiratory: Denies shortness of breath. Gastrointestinal: No abdominal pain.   No nausea, no vomiting.  No diarrhea.  No constipation. Genitourinary: Urinary retention Musculoskeletal: Negative for back pain. Skin: Negative for rash. Neurological: Lower extremity weakness   ____________________________________________   PHYSICAL EXAM:  VITAL SIGNS: ED Triage Vitals  Enc Vitals Group     BP 01/17/17 0201 125/68     Pulse Rate 01/17/17 0201 97     Resp 01/17/17 0201 12     Temp 01/17/17 0201 98.8 F (37.1 C)     Temp Source 01/17/17 0201 Oral     SpO2 01/17/17 0156 98 %     Weight 01/17/17 0202 170 lb (77.1 kg)     Height 01/17/17 0202 5\' 4"  (1.626 m)     Head Circumference --      Peak Flow --      Pain Score 01/17/17 0200 0     Pain Loc --      Pain Edu? --      Excl. in [place]? --     Constitutional: Alert and oriented. Well appearing and in mild distress. Eyes: Conjunctivae are normal. PERRL. EOMI. Head: Atraumatic. Nose: No congestion/rhinnorhea. Mouth/Throat: Mucous membranes are moist.  Oropharynx non-erythematous. Cardiovascular: Normal rate, regular rhythm. Grossly normal heart sounds.  Good peripheral circulation. Respiratory: Normal respiratory effort.  No retractions. Lungs CTAB. Gastrointestinal: Soft and nontender. No distention.  Positive bowel sounds Musculoskeletal: No significant back pain, negative straight leg raise. Neurologic:  Normal speech and language.  Strength 3 out of 5 in bilateral lower extremity sensation intact. Skin:  Skin is warm, dry and intact.  Psychiatric: Mood and affect are normal. .  ____________________________________________   LABS (all labs ordered are listed, but only abnormal results are displayed)  Labs Reviewed  CBC - Abnormal; Notable for the following components:      Result Value   Platelets 129 (*)    All other components within normal limits  BASIC METABOLIC PANEL - Abnormal; Notable for the following components:   Glucose, Bld 132 (*)    All other components within normal limits  URINALYSIS, COMPLETE (UACMP) WITH MICROSCOPIC - Abnormal; Notable for the following components:   Color, Urine AMBER (*)    APPearance CLOUDY (*)    Hgb urine dipstick LARGE (*)    Protein, ur 100 (*)    Leukocytes, UA LARGE (*)    Bacteria, UA RARE (*)    Squamous Epithelial / LPF 0-5 (*)    All other components within normal limits  URINE CULTURE   ____________________________________________  EKG ED ECG REPORT I, Loney Hering, the attending physician, personally viewed and interpreted this ECG.   Date: 01/17/2017  EKG Time: 204  Rate: 89  Rhythm: normal sinus rhythm  Axis: normal  Intervals:none  ST&T Change: none  ____________________________________________  RADIOLOGY  Mr Lumbar Spine Wo Contrast  Result Date: 01/17/2017 CLINICAL DATA:  56 y/o F; back pain, cauda equina syndrome suspected. History of multiple sclerosis and frequent falls. Worsening leg weakness and incontinence. EXAM: MRI LUMBAR SPINE WITHOUT CONTRAST TECHNIQUE: Multiplanar, multisequence MR imaging of the lumbar spine was performed. No intravenous contrast was administered. COMPARISON:  05/14/2016 thoracic spine MRI. FINDINGS: Segmentation:  Standard. Alignment:  Physiologic. Vertebrae: Subcentimeter T12 and L2 vertebral body T1/T2 hyperintense foci compatible with hemangiomata. No marrow edema. Conus medullaris and cauda equina: Conus extends to the L2 level. Central cord lesion at the lower T12 level, present on prior MRI. Paraspinal and other soft tissues: Negative. Disc levels: L1-2: Small disc bulge. No significant foraminal or canal stenosis. L2-3: Small disc bulge. No significant foraminal or canal stenosis. L3-4: Small disc bulge. No significant foraminal or canal stenosis. L4-5: Small disc bulge eccentric the left. Mild left foraminal stenosis. No significant canal stenosis. L5-S1: Small disc bulge eccentric to the left than left subarticular disc protrusion. Mild left foraminal stenosis. Disc  contact on descending left S1 nerve root in left lateral recess. No significant canal stenosis. Mild facet hypertrophy. IMPRESSION: 1. Stable T12 cord lesion likely representing demyelinating plaque. 2. Mild lumbar spondylosis greatest at L4-5 and L5-S1 levels. 3. Mild left L4-5 and L5-S1 foraminal stenosis. Small left L5-S1 subarticular disc protrusion with contact on descending left S1 nerve root in the lateral recess. Electronically Signed   By: Kristine Garbe M.D.   On: 01/17/2017 04:10    ____________________________________________   PROCEDURES  Procedure(s) performed: None  Procedures  Critical Care performed: No  ____________________________________________   INITIAL IMPRESSION / ASSESSMENT AND PLAN / ED COURSE  As part of my medical decision making, I reviewed the following data within the electronic MEDICAL RECORD NUMBER Notes from prior ED visits and [place] Controlled Substance Database   This is a 56 year old female who comes into the hospital today with some leg weakness and urinary retention after a fall.  The patient does have MS and has some of the symptoms regularly but she was concerned due to the fall.  I did order some blood work and ordered for the patient to receive an MRI of her lumbar spine with a concern for cauda equina.  The patient's urinalysis did return with too numerous red blood cells and too numerous white blood cells.  All the patient was in MRI she started having some pain so she was given some morphine and Zofran.  The patient's MRI returned showing a stable T12 cord lesion as well as lumbar spondylolysis and mild left-sided foraminal stenosis.  I will give the patient the results and reassess the patient.     The patient did receive a dose of ceftriaxone for her urinary tract  infection.  I also gave her a dose of Solu-Medrol.  I asked the patient given the history of MS if she felt comfortable going home although she does have some weakness.  The  patient said she does feel comfortable.  She will keep the Foley in place until her urinary tract infection has resolved.  I will encourage the patient to follow-up with her primary care physician and return with any worsening condition or any other concerns.  We did asked the patient if she wanted to stay in the hospital or go home and she said she wanted to go home. ____________________________________________   FINAL CLINICAL IMPRESSION(S) / ED DIAGNOSES  Final diagnoses:  Weakness  Urinary retention  Acute cystitis with hematuria  Multiple sclerosis [facility])     ED Discharge Orders        Ordered    predniSONE (STERAPRED UNI-PAK 21 TAB) 10 MG (21) TBPK tablet     01/17/17 0705    cephALEXin (KEFLEX) 500 MG capsule  4 times daily     01/17/17 0705    lidocaine (LIDODERM) 5 %  Every 12 hours     01/17/17 0705       Note:  This document was prepared using Dragon voice recognition software and may include unintentional dictation errors.    Loney Hering, MD 01/17/17 [phone]

## 2017-01-17 NOTE — ED Triage Notes (Signed)
Pt presents via EMS. Pt fell x 3 days ago. Pt has hx of MS and falls frequently. Pt states worsening leg weakness and incontinence starting yesterday at 3-4 PM. Pt denies fever and n/v/d.

## 2017-01-17 NOTE — Discharge Instructions (Signed)
Please follow up with your primary care physician.

## 2017-01-17 NOTE — ED Notes (Signed)
Standard drainage bag changed to a leg bag. Patient educated on proper care of catheter. Instructed to keep bag below her waist. Patient verbalizes understanding.

## 2017-01-18 ENCOUNTER — Inpatient Hospital Stay: Payer: Medicare Other

## 2017-01-18 LAB — URINE CULTURE

## 2017-01-25 ENCOUNTER — Inpatient Hospital Stay: Payer: Medicare Other | Admitting: Family Medicine

## 2017-01-26 ENCOUNTER — Inpatient Hospital Stay: Payer: Medicare Other

## 2017-01-27 ENCOUNTER — Other Ambulatory Visit: Payer: Self-pay | Admitting: Diagnostic Neuroimaging

## 2017-02-02 ENCOUNTER — Inpatient Hospital Stay: Payer: Medicare Other | Admitting: Family Medicine

## 2017-02-02 ENCOUNTER — Other Ambulatory Visit: Payer: Self-pay | Admitting: *Deleted

## 2017-02-02 MED ORDER — AMANTADINE HCL 100 MG PO CAPS: 100.0000 mg | ORAL_CAPSULE | Freq: Three times a day (TID) | ORAL | 4 refills | Status: DC

## 2017-02-02 NOTE — Telephone Encounter (Signed)
LVM requesting patient call back to clarify pharmacy for Amantadine refill request that was received from [facility], [place]. Advised her Dr Leta Baptist refilled it to [facility] in June 2018,  for one year, so she should have refills remaining. Left number for call back to clarify which pharmacy she is using.

## 2017-02-02 NOTE — Telephone Encounter (Signed)
Received call from patient stating she gets [facility]ine [facility], [facility] [place]. Advised her a new refill will be sent in. She verbalized understanding, appreciation.

## 2017-02-03 ENCOUNTER — Ambulatory Visit: Payer: Medicare Other | Admitting: Family Medicine

## 2017-02-04 NOTE — Progress Notes (Deleted)
[place]  [phone] [phone]) [phone]  ID: [name] OB: Jul 19, 1960  MR#: [id number]  TWS#:[id number]  Patient Care Team: [name], MD as PCP - General (Family Medicine) Bobetta Lime, MD as Referring Physician Byrnett, Forest Gleason, MD (General Surgery) Penni Bombard, MD as Consulting Physician (Neurology) Lloyd Huger, MD as Consulting Physician (Oncology) Leonie Man, MD as Consulting Physician (Cardiology) Minna Merritts, MD as Consulting Physician (Cardiology)  CHIEF COMPLAINT:  No chief complaint on file.   INTERVAL HISTORY: Patient returns to clinic today for routine evaluation. She currently feels well and is asymptomatic. Her pain is currently well-controlled. Patient states her peripheral neuropathy continues to improve. Her lower extremity edema is also significantly improved. She complains of intermittent headache but feels it may be related to stopping drinking coffee.  She denies any chest pain or shortness of breath. She denies any nausea, vomiting, constipation, or diarrhea. She complains of urinary urgency, no other urinary complaints. Patient offers no further specific complaints.   REVIEW OF SYSTEMS:   Review of Systems  Constitutional: Negative for chills, fever and malaise/fatigue.  Respiratory: Negative for cough and shortness of breath.   Cardiovascular: Negative.  Negative for chest pain and leg swelling.  Gastrointestinal: Positive for nausea. Negative for abdominal pain, constipation, diarrhea and vomiting.  Genitourinary: Positive for urgency.  Neurological: Positive for headaches. Negative for weakness.  Psychiatric/Behavioral: Negative.  The patient is not nervous/anxious.     As per HPI. Otherwise, a complete review of systems is negative.  PAST MEDICAL HISTORY: Past Medical History:  Diagnosis Date  . Aortic valve disease    Mild AS / AI - most recent Echo demonstrated tricuspid  aortic valve.  . Bacterial endocarditis    History of .  Marland Kitchen Bilateral lower extremity edema    Noncardiac.  Chronic. LE Venous dopplers - negative for DVT.; Echocardiogram January 2016: Normal EF with normal wall motion and valve function. Only grade 1 diastolic dysfunction. EF 60-65%. Mild MR  . Breast cancer ([place]) 12-31-13   Right breast, 12:00, 1.5 cm, T1c,N0 invasive mammary carcinoma, triple negative. --> Rx with Chemo  . Cervical stenosis of spine   . Herpes zoster   . IBS (irritable bowel syndrome)   . Lymphedema    has legs wrapped at [facility]  . Multiple sclerosis ([place]) 2001   Walks from room to room @ home; but Wheelchair when going out.  Marland Kitchen Neuromuscular disorder ([place])    MS  . Seizures ([place])    Takes Keppra  . Syncope and collapse     PAST SURGICAL HISTORY: Past Surgical History:  Procedure Laterality Date  . ANKLE SURGERY     Left  . ANKLE SURGERY    . ANTERIOR CERVICAL DECOMP/DISCECTOMY FUSION  11/17/2011   Procedure: ANTERIOR CERVICAL DECOMPRESSION/DISCECTOMY FUSION 2 LEVELS;  Surgeon: Erline Levine, MD;  Location: [place] [facility] OR[facility];  Service: Neurosurgery;  Laterality: N/A;  Cervical Five-Six Six-Seven Anterior cervical decompression/diskectomy/fusion  . BREAST BIOPSY Right 12-31-13   invasive mammary  . BREAST SURGERY Right 02/03/2014   Right simple mastectomy with sentinel node biopsy.  . CHOLECYSTECTOMY    . COLONOSCOPY  2014  . Lower extremity venous Dopplers  Feb 27, 2013   No LE DVT  . MASTECTOMY    . Port a cath insertion Right 01/19/2010  . PORT-A-CATH REMOVAL     right  . PORT-A-CATH REMOVAL Right 09/03/2013   Procedure: REMOVAL PORT-A-CATH;  Surgeon: Conrad Williamsport, MD;  Location: [facility];  Service: Vascular;  Laterality: Right;  . TRANSTHORACIC ECHOCARDIOGRAM  03/2013; 02/2014   a) Normal LV size and function with EF 60-65%.; Cannot exclude bicuspid aortic valve with mild AS and mild AI.; b) Normal EF with normal wall motion and valve function x Mild MR. G2 DD. EF  60-65%. Tricuspid AoV  . UPPER GI ENDOSCOPY  2014    FAMILY HISTORY Family History  Problem Relation Age of Onset  . Cancer Father        skin  . Heart disease Father   . Heart attack Father        heart attack in his 65's  . Thyroid disease Sister   . Ovarian cancer Cousin   . Breast cancer Maternal Aunt 60  . Breast cancer Maternal Grandmother 74       ADVANCED DIRECTIVES:    HEALTH MAINTENANCE: Social History   Tobacco Use  . Smoking status: Never Smoker  . Smokeless tobacco: Never Used  Substance Use Topics  . Alcohol use: No  . Drug use: No     Colonoscopy:  PAP:  Bone density:  Lipid panel:  Allergies  Allergen Reactions  . Fentanyl Nausea And Vomiting and Nausea Only    vomiting Was given in PACU x3 each time patient got sick vomiting Was given in PACU x3 each time patient got sick  . Sulfa Antibiotics Hives and Other (See Comments)    Light headed, over heated    Current Outpatient Medications  Medication Sig Dispense Refill  . amantadine (SYMMETREL) 100 MG capsule Take 1 capsule (100 mg total) by mouth 3 (three) times daily. 270 capsule 4  . baclofen (LIORESAL) 10 MG tablet TAKE 1 TO 2 TABLETS BY MOUTH 3 TIMES A DAY 270 tablet 4  . fentaNYL (DURAGESIC - DOSED MCG/HR) 50 MCG/HR Place 1 patch (50 mcg total) onto the skin every 3 (three) days. 10 patch 0  . glucosamine-chondroitin 500-400 MG tablet Take 1 tablet by mouth 4 (four) times daily.    Marland Kitchen ibuprofen (ADVIL,MOTRIN) 600 MG tablet Take 1 tablet (600 mg total) by mouth every 8 (eight) hours as needed. 60 tablet 5  . Incontinence Supply Disposable (PREVAIL WET WIPES) MISC Apply 1 each topically daily as needed. (dx neurogenic bladder N31.9, LON 99 months) 96 each 11  . Interferon Beta-1a (REBIF REBIDOSE) 44 MCG/0.5ML SOAJ Inject 0.5 mLs into the skin 3 (three) times a week. (Patient taking differently: Inject 0.5 mLs into the skin 3 (three) times a week. Monday, Wednesday and Friday) 18 mL 4  .  levETIRAcetam (KEPPRA) 500 MG tablet TAKE 1 TABLET (500 MG TOTAL) BY MOUTH 2 (TWO) TIMES DAILY. 180 tablet 2  . lidocaine (LIDODERM) 5 % Place 1 patch onto the skin every 12 (twelve) hours. Remove & Discard patch within 12 hours or as directed by MD 10 patch 0  . Multiple Vitamins-Minerals (MULTIVITAMIN PO) Take 1 tablet by mouth 2 (two) times daily.     Marland Kitchen oxybutynin (DITROPAN-XL) 10 MG 24 hr tablet TAKE 2 TABLETS BY MOUTH DAILY 180 tablet 3  . predniSONE (STERAPRED UNI-PAK 21 TAB) 10 MG (21) TBPK tablet Take 6 tabs on day 1 Take 5 tabs on day 2 Take 4 tabs on day 3 Take 3 tabs on day 4 Take 2 tabs on day 5 Take 1 tab on day 6 21 tablet 0   No current facility-administered medications for this visit.     OBJECTIVE: There were no vitals filed for  this visit.   There is no height or weight on file to calculate BMI.    ECOG FS:1 - Symptomatic but completely ambulatory  General: Well-developed, well-nourished, no acute distress. Sitting in a wheelchair. Eyes: anicteric sclera. Breasts: Patient requested exam be deferred today. Lungs: Clear to auscultation bilaterally. Heart: Regular rate and rhythm. No rubs, murmurs, or gallops. Abdomen: Soft, nontender, nondistended. No organomegaly noted, normoactive bowel sounds. Musculoskeletal: Bilateral lower extremity lymphedema. Neuro: Alert, answering all questions appropriately. Cranial nerves grossly intact. Skin: No rashes or petechiae noted. Psych: Normal affect.   LAB RESULTS:  Lab Results  Component Value Date   NA 137 01/17/2017   K 3.6 01/17/2017   CL 102 01/17/2017   CO2 23 01/17/2017   GLUCOSE 132 (H) 01/17/2017   BUN 12 01/17/2017   CREATININE 0.57 01/17/2017   CALCIUM 9.3 01/17/2017   PROT 7.0 07/28/2016   ALBUMIN 4.3 07/28/2016   AST 21 07/28/2016   ALT 22 07/28/2016   ALKPHOS 84 07/28/2016   BILITOT 0.3 07/28/2016   GFRNONAA >60 01/17/2017   GFRAA >60 01/17/2017    Lab Results  Component Value Date   WBC 9.5  01/17/2017   NEUTROABS 3.3 06/27/2016   HGB 13.0 01/17/2017   HCT 38.6 01/17/2017   MCV 90.2 01/17/2017   PLT 129 (L) 01/17/2017   Lab Results  Component Value Date   LABCA2 18.0 08/02/2016     STUDIES: Mr Lumbar Spine Wo Contrast  Result Date: 01/17/2017 CLINICAL DATA:  56 y/o F; back pain, cauda equina syndrome suspected. History of multiple sclerosis and frequent falls. Worsening leg weakness and incontinence. EXAM: MRI LUMBAR SPINE WITHOUT CONTRAST TECHNIQUE: Multiplanar, multisequence MR imaging of the lumbar spine was performed. No intravenous contrast was administered. COMPARISON:  05/14/2016 thoracic spine MRI. FINDINGS: Segmentation:  Standard. Alignment:  Physiologic. Vertebrae: Subcentimeter T12 and L2 vertebral body T1/T2 hyperintense foci compatible with hemangiomata. No marrow edema. Conus medullaris and cauda equina: Conus extends to the L2 level. Central cord lesion at the lower T12 level, present on prior MRI. Paraspinal and other soft tissues: Negative. Disc levels: L1-2: Small disc bulge. No significant foraminal or canal stenosis. L2-3: Small disc bulge. No significant foraminal or canal stenosis. L3-4: Small disc bulge. No significant foraminal or canal stenosis. L4-5: Small disc bulge eccentric the left. Mild left foraminal stenosis. No significant canal stenosis. L5-S1: Small disc bulge eccentric to the left than left subarticular disc protrusion. Mild left foraminal stenosis. Disc contact on descending left S1 nerve root in left lateral recess. No significant canal stenosis. Mild facet hypertrophy. IMPRESSION: 1. Stable T12 cord lesion likely representing demyelinating plaque. 2. Mild lumbar spondylosis greatest at L4-5 and L5-S1 levels. 3. Mild left L4-5 and L5-S1 foraminal stenosis. Small left L5-S1 subarticular disc protrusion with contact on descending left S1 nerve root in the lateral recess. Electronically Signed   By: Kristine Garbe M.D.   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 BRCA negative.  PLAN:    1. Triple negative right breast cancer: No evidence of disease. CA-27-29 continues to be within normal limits. Because patient had a full mastectomy, she did not require adjuvant XRT. Given the fact that she is a triple negative cancer, she will not require an aromatase inhibitor. Given her difficulties with Taxol and multiple sclerosis, treatment was discontinued after a total of 6 of 12 cycles of Taxol. Her last chemotherapy was July 17, 2014. Mammogram of her  left breast on March 11, 2015 was reported as BIRADS 1, repeat in one year.  Return to clinic in 6 months for laboratory work and further evaluation.  2. Multiple sclerosis: Continue evaluation and current treatment per neurology. 3. Lymphedema: Continue treatment and wraps as prescribed. 4. Peripheral neuropathy: Improved. Monitor. 5. Pain: Continue fentanyl patch to 50 g and 10 mg oxycodone as needed for breakthrough pain.  6. Nausea: Continue compazine 10 mg as needed for nausea 7. Headache: Trial drinking tea rather than coffee to see if headaches subside.  Her primary neurologist is Dr. Andrey Spearman.  phone [phone], fax [phone].   Patient expressed understanding and was in agreement with this plan. She also understands that She can call clinic at any time with any questions, concerns, or complaints.   Breast cancer   Staging form: Breast, AJCC 7th Edition     Pathologic stage from 05/24/2014: Stage IA (T1c, N0, cM0) - Signed by Lloyd Huger, MD on 05/24/2014   Lloyd Huger, MD   02/04/2017 8:26 AM

## 2017-02-06 ENCOUNTER — Inpatient Hospital Stay: Payer: Medicare Other

## 2017-02-06 ENCOUNTER — Inpatient Hospital Stay: Payer: Medicare Other | Admitting: Oncology

## 2017-02-09 ENCOUNTER — Ambulatory Visit (INDEPENDENT_AMBULATORY_CARE_PROVIDER_SITE_OTHER): Payer: Medicare Other | Admitting: Family Medicine

## 2017-02-09 ENCOUNTER — Encounter: Payer: Self-pay | Admitting: Family Medicine

## 2017-02-09 VITALS — BP 126/78 | HR 69 | Temp 97.4°F | Ht 64.0 in

## 2017-02-09 DIAGNOSIS — G35 Multiple sclerosis: Secondary | ICD-10-CM | POA: Diagnosis not present

## 2017-02-09 DIAGNOSIS — Z960 Presence of urogenital implants: Secondary | ICD-10-CM

## 2017-02-09 DIAGNOSIS — Z9289 Personal history of other medical treatment: Secondary | ICD-10-CM | POA: Diagnosis not present

## 2017-02-09 NOTE — Progress Notes (Signed)
BP 126/78 (BP Location: Left Arm, Patient Position: Sitting, Cuff Size: Large)   Pulse 69   Temp (!) 97.4 F (36.3 C) (Oral)   Ht 5\' 4"  (1.626 m)   SpO2 98%   BMI 29.18 kg/m    Subjective:    Patient ID: Rebecca [name], female    DOB: 1960-12-07, 57 y.o.   MRN: [id number]  HPI: Rebecca [name] is a 57 y.o. female  Chief Complaint  Patient presents with  . Urinary Tract Infection    follow up, has cath in place   . Stool Color Change    Clear watery stool, since having cath in place   . Headache    HPI Patient is here for f/u She is here for a urinary tract infection She had a few falls; she didn't know why but remembers that the last time she fell, it was a UTI She fell and the ambulance took her to the ER They checked her urine; this was on Dec 11th, when the same came They treated her with cephalexin She has taken all of her medicine for the urinary tract They put a catheter in on December 11th She still has the indwelling Foley Had some diarrhea, since the antibiotic; for a few weeks; she had a full BM yesterday; feels back to normal She had some white clear stuff in the with the diarrhea until two days ago, and then yesterday's BM was back to normal; it was more like thick gooey mucous No fevers since leaving the hospital Urine smells like corn Had a headache last night; thinks it was because she has stopped her coffee; likes her coffee, but stopped it cold [place]; now that her bowels are back to normal, she'll go back to the coffee; caffeine withdrawal headache  Depression screen [facility] 2/9 02/09/2017 10/28/2016 07/28/2016 07/15/2016 06/27/2016  Decreased Interest 0 - 0 2 0  Down, Depressed, Hopeless 0 0 0 0 0  PHQ - 2 Score 0 0 0 2 0  Altered sleeping - - - 0 -  Tired, decreased energy - - - 0 -  Change in appetite - - - 0 -  Feeling bad or failure about yourself  - - - 0 -  Trouble concentrating - - - 0 -  Moving slowly or fidgety/restless - - - 0 -  Suicidal thoughts  - - - 0 -  PHQ-9 Score - - - 2 -  Difficult doing work/chores - - - Somewhat difficult -  Some recent data might be hidden    Relevant past medical, surgical, family and social history reviewed Past Medical History:  Diagnosis Date  . Aortic valve disease    Mild AS / AI - most recent Echo demonstrated tricuspid aortic valve.  . Bacterial endocarditis    History of .  Marland Kitchen Bilateral lower extremity edema    Noncardiac.  Chronic. LE Venous dopplers - negative for DVT.; Echocardiogram January 2016: Normal EF with normal wall motion and valve function. Only grade 1 diastolic dysfunction. EF 60-65%. Mild MR  . Breast cancer ([place]) 12-31-13   Right breast, 12:00, 1.5 cm, T1c,N0 invasive mammary carcinoma, triple negative. --> Rx with Chemo  . Cervical stenosis of spine   . Herpes zoster   . IBS (irritable bowel syndrome)   . Lymphedema    has legs wrapped at [facility]  . Multiple sclerosis ([place]) 2001   Walks from room to room @ home; but Wheelchair when going out.  Marland Kitchen Neuromuscular  disorder ([place])    MS  . Seizures ([place])    Takes Keppra  . Syncope and collapse    Past Surgical History:  Procedure Laterality Date  . ANKLE SURGERY     Left  . ANKLE SURGERY    . ANTERIOR CERVICAL DECOMP/DISCECTOMY FUSION  11/17/2011   Procedure: ANTERIOR CERVICAL DECOMPRESSION/DISCECTOMY FUSION 2 LEVELS;  Surgeon: Erline Levine, MD;  Location: [place] [facility] ORS;  Service: Neurosurgery;  Laterality: N/A;  Cervical Five-Six Six-Seven Anterior cervical decompression/diskectomy/fusion  . BREAST BIOPSY Right 12-31-13   invasive mammary  . BREAST SURGERY Right 02/03/2014   Right simple mastectomy with sentinel node biopsy.  . CHOLECYSTECTOMY    . COLONOSCOPY  2014  . Lower extremity venous Dopplers  Feb 27, 2013   No LE DVT  . MASTECTOMY    . Port a cath insertion Right 01/19/2010  . PORT-A-CATH REMOVAL     right  . PORT-A-CATH REMOVAL Right 09/03/2013   Procedure: REMOVAL PORT-A-CATH;  Surgeon: Conrad Wyandot, MD;   Location: [place];  Service: Vascular;  Laterality: Right;  . TRANSTHORACIC ECHOCARDIOGRAM  03/2013; 02/2014   a) Normal LV size and function with EF 60-65%.; Cannot exclude bicuspid aortic valve with mild AS and mild AI.; b) Normal EF with normal wall motion and valve function x Mild MR. G2 DD. EF 60-65%. Tricuspid AoV  . UPPER GI ENDOSCOPY  2014   Family History  Problem Relation Age of Onset  . Cancer Father        skin  . Heart disease Father   . Heart attack Father        heart attack in his 50's  . Thyroid disease Sister   . Ovarian cancer Cousin   . Breast cancer Maternal Aunt 60  . Breast cancer Maternal Grandmother 74   Social History   Tobacco Use  . Smoking status: Never Smoker  . Smokeless tobacco: Never Used  Substance Use Topics  . Alcohol use: No  . Drug use: No   Interim medical history since last visit reviewed. Allergies and medications reviewed  Review of Systems Per HPI unless specifically indicated above     Objective:    BP 126/78 (BP Location: Left Arm, Patient Position: Sitting, Cuff Size: Large)   Pulse 69   Temp (!) 97.4 F (36.3 C) (Oral)   Ht 5\' 4"  (1.626 m)   SpO2 98%   BMI 29.18 kg/m   Wt Readings from Last 3 Encounters:  01/17/17 170 lb (77.1 kg)  11/09/16 170 lb (77.1 kg)  10/28/16 170 lb (77.1 kg)    Physical Exam  Constitutional: She appears well-developed and well-nourished. No distress.  Seated in wheelchair; gait not assessed  Eyes: EOM are normal. No scleral icterus.  Neck: No JVD present. No thyromegaly present.  Cardiovascular: Normal rate.  Pulmonary/Chest: Effort normal. She has no wheezes.  Abdominal: Soft. She exhibits no distension.  Genitourinary:  Genitourinary Comments: Foley catheter in place  Neurological: She is alert. She displays no tremor.  Seated in transfer chair; voice is not excessively weak; able to speak in full sentences; gait not assessed  Skin: No pallor.  Psychiatric: She has a normal mood and  affect. Her behavior is normal. Judgment and thought content normal.      Assessment & Plan:   Problem List Items Addressed This Visit      Nervous and Auditory   MS (multiple sclerosis) ([place])    Patient feels like she is benefitting from the  urinary catheter; will refer to urologist       Other Visit Diagnoses    Urinary catheter in place    -  Primary   Relevant Orders   Ambulatory referral to Urology       Follow up plan: No Follow-up on file.  An after-visit summary was printed and given to the patient at [place].  Please see the patient instructions which may contain other information and recommendations beyond what is mentioned above in the assessment and plan.  No orders of the defined types were placed in this encounter.   Orders Placed This Encounter  Procedures  . Ambulatory referral to Urology

## 2017-02-09 NOTE — Patient Instructions (Signed)
Keep an eye on your bowels and notify me if the mucous or diarrhea returns Start a probiotic daily for the next few weeks to reset your bowels, or you can eat kimchi or yogurt with live cultures We'll get you to the urologist If you have not heard anything from my staff in a week about any orders/referrals/studies from today, please contact us here to follow-up [phone]

## 2017-02-09 NOTE — Assessment & Plan Note (Signed)
Patient feels like she is benefitting from the urinary catheter; will refer to urologist

## 2017-02-13 ENCOUNTER — Encounter: Payer: Self-pay | Admitting: Diagnostic Neuroimaging

## 2017-02-13 ENCOUNTER — Ambulatory Visit (INDEPENDENT_AMBULATORY_CARE_PROVIDER_SITE_OTHER): Payer: Medicare Other | Admitting: Diagnostic Neuroimaging

## 2017-02-13 VITALS — BP 134/73 | HR 67 | Ht 64.0 in

## 2017-02-13 DIAGNOSIS — G40909 Epilepsy, unspecified, not intractable, without status epilepticus: Secondary | ICD-10-CM | POA: Diagnosis not present

## 2017-02-13 DIAGNOSIS — N39498 Other specified urinary incontinence: Secondary | ICD-10-CM

## 2017-02-13 DIAGNOSIS — G35 Multiple sclerosis: Secondary | ICD-10-CM

## 2017-02-13 DIAGNOSIS — R5383 Other fatigue: Secondary | ICD-10-CM

## 2017-02-13 MED ORDER — OXYBUTYNIN CHLORIDE ER 10 MG PO TB24: 20.0000 mg | ORAL_TABLET | Freq: Every day | ORAL | 3 refills | Status: DC

## 2017-02-13 MED ORDER — LEVETIRACETAM 500 MG PO TABS: 500.0000 mg | ORAL_TABLET | Freq: Two times a day (BID) | ORAL | 2 refills | Status: DC

## 2017-02-13 MED ORDER — BACLOFEN 10 MG PO TABS: ORAL_TABLET | ORAL | 4 refills | Status: DC

## 2017-02-13 MED ORDER — AMANTADINE HCL 100 MG PO CAPS: 100.0000 mg | ORAL_CAPSULE | Freq: Three times a day (TID) | ORAL | 4 refills | Status: DC

## 2017-02-13 NOTE — Progress Notes (Signed)
[facility]  PATIENT: Rebecca [name] DOB: 1960-12-10  REFERRING CLINICIAN:  HISTORY FROM: patient, husband REASON FOR VISIT: follow up   HISTORICAL  CHIEF COMPLAINT:  Chief Complaint  Patient presents with  . Follow-up  . Multiple Sclerosis    doing ok, needs refill on keppra.  Has had falls prior to ED visit, went to ED has UTI and wearing a foley catheter now, has appt with urrology appt. tomorrow.     HISTORY OF PRESENT ILLNESS:   UPDATE (02/13/17, VRP): Since last visit, doing well until Dec 2018 --> had a fall, went to ER and dx'd with UTI. Now on indwelling foley. Has appt with urology soon. Tolerating other meds including rebif. No alleviating or aggravating factors. No seizures.   UPDATE 08/01/16: Since last visit, was admitted to hospital in April 2018 for UTI and generalized weakness. MRI brain, c and t spine were neg for new or acute plaques.   UPDATE 11/30/15: Since last visit, doing well. HA are improved. MS stable. Baclofen, LEV, amantadine, rebif working. No seizures.   UPDATE 08/06/15: Since last visit, overall stable. MS sxs are stable. However having new headaches (severe, 10 days out of last 1 month, eyes, no nausea/vomiting, positive phonophobia). Never had these kind of headaches before. Headaches are better in last few days.   UPDATE 03/23/15: Since last visit, doing well. Back on rebif. Ready to try PT evaluation again. Tolerating meds.   UPDATE 09/16/14: Since last visit, completed breast CA treatments (adriamycin, cytoxan, taxol; couldn't complete the taxol cycles). Now ready for restarting MS treatments. No focal flares of MS, but had generalized slowness, fatigue, muscle spasms.   UPDATE 02/13/14: Since last visit, has been diagnosed with triple negative breast CA, s/p right mastectomy. Now planning to have chemotherapy. No new neuro symptoms. Tolerating rebif.   UPDATE 11/13/13: Since last visit, doing well. Getting wrap / lymphedema  therapy via Amy (OT) at [facility] with great results. Significant improvement. Tolerating rebif. No new lesions or symptoms.  UPDATE 08/14/13: Since last visit, has continued on rebif. BLE edema still a problem. Saw cardiology, tried diuretic, and also trying wrap/massage therapy next week. Walking and mobility gradually worsening.   UPDATE 02/13/13 (LL): The patient returns for followup visit. Since last visit she states that she has seen Dr. Katy Fitch and that she needs new prescription eyeglasses for vision problems that the double vision resolved. She is tolerating rebif. Her biggest concern is that her bilateral leg edema is making it difficult for her to move her legs or ambulate. She is not consistently wearing her TED hose. She denies any shortness of breath, but is mostly wheelchair-bound. She states that she feels like she is progressively getting weaker. She asked for information about Tecfidera. She said she had a cardiology consultation in the past when she started Gilenya, but that Dr. has since retired. She states she was diagnosed with aortic insufficiency. Her urgency and frequency of urination is getting worse, she asked if she can be increased on her medication.   UPDATE 11/07/12: Since last visit, doing well, until 1 month ago and developed fluctuating double vision/blurred vision. Affects right greater than left eye. It is present even if she closes the right or left eyes.   UPDATE 04/20/12: Since last visit, has come off gabapentin. BLE swelling stable. S/p ACDF surg per Dr. Vertell Limber and doing well. Tolerating rebif. Has new PCP and also getting PT. Few days ago, developed some numbness in right hand,  similar to pre-op symptoms which improved post-op. No neck pain.   UPDATE 01/10/12: Noticed worsening swelling to bilateral lower extremities since 01/04/12. She is also having throbbing pain to shins. Difficulty with wearing sneakers. Unable to use leg compression machine. Denies shortness of breath.    UPDATE 10/27/11: Having surgery on c-spine fusion on November 17, 2011. Concerned about recovery time and her ability to perform ADL's. Lives with aunt. Wanting to know if she needs to have someone come to home or stay at rehab. Intermittent blurred vision las 3 weeks.   UPDATE 07/27/11: Doing better with shingles and post-herpetic neuralgia pain; on gralise. Now with worsening RLE weakness, fatigue, vision changes, speech diff. Discussed tx options.   UPDATE 06/02/11: On 05/27/11 developed right lower tooth pain; 4/20 developed blisters and rash on right chin and jaw. 4/22 went to dentist, diagnosed with tooth infx and given amoxicillin. Rash more significant. 06/01/11, went to dermatologist and dx'd possible shingles. Given valtrex and started on 4/24. Lesions still oozing. Has right ear and right facial pain, severe. No eye pain.   UPDATE 03/02/10: Patient was getting worked up for first dose observation for Gilenya, however on 02/28/2011, patient had a suspected seizure. She was in the bathroom at home, stood up and without warning have loss of consciousness. Her mother found her on the floor groaning with some twitching movements of her upper extremities, and her legs outstretched in front of her. No incontinence or tongue biting. It took approximately 20-30 minutes for her to wake up. She was amnestic events until the next day.  In retrospect, patient had 4 or 5 episodes of syncope versus seizure in the early 1980s. She was never on antiseizure medication. Patient was also on Ampyra since past 2 years.   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 Last dose tysabri on 10/29/10. She would like to switch to [place]. No MS flares. Some increased fatigue. Also asking if amantadine can be stopped to see if peripheral edema improves.   PRIOR HPI (11/02/10): 57 year old right-handed female with history of multiple sclerosis. Here for transfer of care from [place]. 2001 - patient developed left leg numbness and  weakness, followed by right facial weakness and slurred speech. She was evaluated with MRI of the brain, diagnosed with multiple sclerosis and started on Copaxone. She did not have lumbar puncture or spinal cord MRI. 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 She thinks she was off of Copaxone for approximately 2 years. 2008-9 - patient was evaluated at [facility] and started on Tysabri. She thinks she has been on Tysabri for approximately 3-1/2 years. Last dose was last week at [facility]. She continues to decline in terms of mobility. She is to walk using a cane, then started using a walker, now uses a transport chair occasionally. Patient continues to have intermittent right facial twitching and numbness. She is on amantadine, baclofen, Amprya, and oxybutynin for MS symptoms.    REVIEW OF SYSTEMS: Full 14 system review of systems performed and negative except: diff urinating leg swelling cramps.     ALLERGIES: Allergies  Allergen Reactions  . Fentanyl Nausea And Vomiting and Nausea Only    vomiting Was given in PACU x3 each time patient got sick vomiting Was given in PACU x3 each time patient got sick  . Sulfa Antibiotics Hives and Other (See Comments)    Light headed, over heated    HOME MEDICATIONS: Outpatient Medications Prior to  Visit  Medication Sig Dispense Refill  . amantadine (SYMMETREL) 100 MG capsule Take 1 capsule (100 mg total) by mouth 3 (three) times daily. 270 capsule 4  . baclofen (LIORESAL) 10 MG tablet TAKE 1 TO 2 TABLETS BY MOUTH 3 TIMES A DAY 270 tablet 4  . glucosamine-chondroitin 500-400 MG tablet Take 1 tablet by mouth 4 (four) times daily.    Marland Kitchen ibuprofen (ADVIL,MOTRIN) 600 MG tablet Take 1 tablet (600 mg total) by mouth every 8 (eight) hours as needed. 60 tablet 5  . Incontinence Supply Disposable (PREVAIL WET WIPES) MISC Apply 1 each topically daily as needed. (dx neurogenic bladder N31.9, LON 99 months) 96  each 11  . Interferon Beta-1a (REBIF REBIDOSE) 44 MCG/0.5ML SOAJ Inject 0.5 mLs into the skin 3 (three) times a week. (Patient taking differently: Inject 0.5 mLs into the skin 3 (three) times a week. Monday, Wednesday and Friday) 18 mL 4  . levETIRAcetam (KEPPRA) 500 MG tablet TAKE 1 TABLET (500 MG TOTAL) BY MOUTH 2 (TWO) TIMES DAILY. 180 tablet 2  . lidocaine (LIDODERM) 5 % Place 1 patch onto the skin every 12 (twelve) hours. Remove & Discard patch within 12 hours or as directed by MD 10 patch 0  . Multiple Vitamins-Minerals (MULTIVITAMIN PO) Take 1 tablet by mouth 2 (two) times daily.     Marland Kitchen oxybutynin (DITROPAN-XL) 10 MG 24 hr tablet TAKE 2 TABLETS BY MOUTH DAILY 180 tablet 3   No facility-administered medications prior to visit.     PAST MEDICAL HISTORY: Past Medical History:  Diagnosis Date  . Aortic valve disease    Mild AS / AI - most recent Echo demonstrated tricuspid aortic valve.  . Bacterial endocarditis    History of .  Marland Kitchen Bilateral lower extremity edema    Noncardiac.  Chronic. LE Venous dopplers - negative for DVT.; Echocardiogram January 2016: Normal EF with normal wall motion and valve function. Only grade 1 diastolic dysfunction. EF 60-65%. Mild MR  . Breast cancer ([place]) 12-31-13   Right breast, 12:00, 1.5 cm, T1c,N0 invasive mammary carcinoma, triple negative. --> Rx with Chemo  . Cervical stenosis of spine   . Herpes zoster   . IBS (irritable bowel syndrome)   . Lymphedema    has legs wrapped at [facility]  . Multiple sclerosis ([place]) 2001   Walks from room to room @ home; but Wheelchair when going out.  Marland Kitchen Neuromuscular disorder ([place])    MS  . Seizures ([place])    Takes Keppra  . Syncope and collapse     PAST SURGICAL HISTORY: Past Surgical History:  Procedure Laterality Date  . ANKLE SURGERY     Left  . ANKLE SURGERY    . ANTERIOR CERVICAL DECOMP/DISCECTOMY FUSION  11/17/2011   Procedure: ANTERIOR CERVICAL DECOMPRESSION/DISCECTOMY FUSION 2 LEVELS;  Surgeon: Erline Levine, MD;  Location: [facility];  Service: Neurosurgery;  Laterality: N/A;  Cervical Five-Six Six-Seven Anterior cervical decompression/diskectomy/fusion  . BREAST BIOPSY Right 12-31-13   invasive mammary  . BREAST SURGERY Right 02/03/2014   Right simple mastectomy with sentinel node biopsy.  . CHOLECYSTECTOMY    . COLONOSCOPY  2014  . Lower extremity venous Dopplers  Feb 27, 2013   No LE DVT  . MASTECTOMY    . Port a cath insertion Right 01/19/2010  . PORT-A-CATH REMOVAL     right  . PORT-A-CATH REMOVAL Right 09/03/2013   Procedure: REMOVAL PORT-A-CATH;  Surgeon: Conrad , MD;  Location: [facility];  Service: Vascular;  Laterality: Right;  . TRANSTHORACIC ECHOCARDIOGRAM  03/2013; 02/2014   a) Normal LV size and function with EF 60-65%.; Cannot exclude bicuspid aortic valve with mild AS and mild AI.; b) Normal EF with normal wall motion and valve function x Mild MR. G2 DD. EF 60-65%. Tricuspid AoV  . UPPER GI ENDOSCOPY  2014    FAMILY HISTORY: Family History  Problem Relation Age of Onset  . Cancer Father        skin  . Heart disease Father   . Heart attack Father        heart attack in his 79's  . Thyroid disease Sister   . Ovarian cancer Cousin   . Breast cancer Maternal Aunt 60  . Breast cancer Maternal Grandmother 74    SOCIAL HISTORY:  Social History   Socioeconomic History  . Marital status: Married    Spouse name: don  . Number of children: 0  . Years of education: 53  . Highest education level: Not on file  Social Needs  . Financial resource strain: Not on file  . Food insecurity - worry: Not on file  . Food insecurity - inability: Not on file  . Transportation needs - medical: Not on file  . Transportation needs - non-medical: Not on file  Occupational History  . Occupation: disability  Tobacco Use  . Smoking status: Never Smoker  . Smokeless tobacco: Never Used  Substance and Sexual Activity  . Alcohol use: No  . Drug use: No  . Sexual activity: Not  Currently    Partners: Male  Other Topics Concern  . Not on file  Social History Narrative   She is married. Recently moved back to [place] after being in [place] for some time. She is accompanied by her husband and aunt.   Never smoked. Never used alcohol.     PHYSICAL EXAM  Vitals:   02/13/17 1350  BP: 134/73  Pulse: 67  Height: 5\' 4"  (1.626 m)    Not recorded      Body mass index is 29.18 kg/m.  GENERAL EXAM:  General: Patient is awake, alert and in no acute distress. Well developed and groomed.  Neck: Neck is supple.  Cardiovascular: Heart is regular rate and rhythm with mild systolic murmur. PERIPHERAL EDEMA IN BLE.   Neurologic Exam  Mental Status: Awake, alert. Language is fluent and comprehension intact. Cranial Nerves: Pupils are equal and reactive to light. Visual fields are full to confrontation. Conjugate eye movements are full and symmetric --> MILD SACCADIC BREAKDOWN OF SMOOTH PURSUIT. Facial sensation and strength are symmetric. Hearing is intact. Palate elevated symmetrically and uvula is midline. Shoulder shrug is symmetric. Tongue is midline.  Motor: Normal bulk and tone. Full strength in the upper extremities; BLE (1-2/5 PROX, 3/5 DISTAL; RIGHT WEAKER THAN LEFT), INCREASED TONE IN BLE. No pronator drift.  Sensory: Intact and symmetric to light touch.  Coordination: FTN --> MILD DYSMETRIA Reflexes: Deep tendon reflexes in the upper and lower extremity are present and symmetric; TRACE AT KNEES AND ANKLES. MILD PERIPHERAL EDEMA. Gait and Station: [place].     DIAGNOSTIC DATA (LABS, IMAGING, TESTING) - I reviewed patient records, labs, notes, testing and imaging myself where available.  Lab Results  Component Value Date   WBC 9.5 01/17/2017   HGB 13.0 01/17/2017   HCT 38.6 01/17/2017   MCV 90.2 01/17/2017   PLT 129 (L) 01/17/2017      Component Value Date/Time   NA 137 01/17/2017 0210  NA 143 07/28/2016 1520   NA 139 06/05/2014  0951   K 3.6 01/17/2017 0210   K 3.8 06/05/2014 0951   CL 102 01/17/2017 0210   CL 107 06/05/2014 0951   CO2 23 01/17/2017 0210   CO2 28 06/05/2014 0951   GLUCOSE 132 (H) 01/17/2017 0210   GLUCOSE 130 (H) 06/05/2014 0951   BUN 12 01/17/2017 0210   BUN 12 07/28/2016 1520   BUN 12 06/05/2014 0951   CREATININE 0.57 01/17/2017 0210   CREATININE 0.49 (L) 11/25/2015 1216   CALCIUM 9.3 01/17/2017 0210   CALCIUM 9.4 06/05/2014 0951   PROT 7.0 07/28/2016 1520   PROT 7.0 05/29/2014 0949   ALBUMIN 4.3 07/28/2016 1520   ALBUMIN 4.1 05/29/2014 0949   AST 21 07/28/2016 1520   AST 24 05/29/2014 0949   ALT 22 07/28/2016 1520   ALT 24 05/29/2014 0949   ALKPHOS 84 07/28/2016 1520   ALKPHOS 58 05/29/2014 0949   BILITOT 0.3 07/28/2016 1520   BILITOT 0.6 05/29/2014 0949   GFRNONAA >60 01/17/2017 0210   GFRNONAA >89 11/25/2015 1216   GFRAA >60 01/17/2017 0210   GFRAA >89 11/25/2015 1216   Lab Results  Component Value Date   CHOL 214 (H) 11/25/2015   No results found for: HGBA1C Lab Results  Component Value Date   VITAMINB12 599 07/28/2016   Lab Results  Component Value Date   TSH 1.83 11/25/2015     08/21/13 MRI brain (with and without) demonstrating:  1. Multiple periventricular and subcortical chronic demyelinating plaques. Some of these are confluent. Some are hypointense on T1.  2. No acute plaques.  3. No significant change from MRI on 11/20/12.   08/21/13 MRI cervical spine (with and without) demonstrating:  1. At C2-3: disc bulging and facet hypertrophy with mild spinal stenosis and mild left foraminal stenosis.  2. At C5-6: uncovertebral joint hypertrophy with mild right foraminal stenosis.  3. Multiple chronic demyelinating plaques from C2 to T1. No abnormal enhancing lesions.  4. Compared to MRI from 10/04/11, there has been progression of degenerative spine disease at C2-3. Also there has been interval ACDF from C5-C7. No significant change in spinal cord  lesions.  08/21/13 MRI thoracic spine (with and without) demonstrating:  1. Multiple spinal cord chronic demyelinating plaques from C7 to T12-L1.  2. No acute plaques.  3. ACDF at C5-C7.  4. Compared to prior MRI from 08/03/11, the ACDF is new. Overall no new demyelinating plaques.  09/30/14 MRI brain  - showing 2/flare foci in the right cervical hemisphere, pons, thalami and cerebral hemispheres in a pattern and configuration consistent with the diagnosis of multiple sclerosis. None of the foci enhances after contrast administration. There are no acute findings. When compared to the MRI dated 08/21/2013, there has been no interval change.  10/07/15 MRI brain  - Moderately severe white matter changes consistent with multiple sclerosis are stable since the prior MRI. - Negative for metastatic disease.  No acute abnormality.  05/14/16 MRI HEAD: [I reviewed images myself and agree with interpretation. -VRP]  - Stable examination: Severe chronic demyelination without new lesions or acute inflammation. No parenchymal brain volume loss for age.  05/14/16 MRI CERVICAL SPINE:  - Moderately motion degraded examination. Chronic demyelination without myelomalacia. No enhancement to suggest acute inflammation. - Status post C5 through C7 ACDF with suspected nonunion C6-7, recommend cervical spine radiographs. - Moderate canal stenosis C4-5 consistent with adjacent segment disease. Mild canal stenosis C2-3 and C3-4.  - Multilevel neural  foraminal narrowing: Severe on the RIGHT at C4-5.  05/14/16 MRI THORACIC SPINE:  - Suspected chronic demyelination of the upper thoracic spinal cord though, limited assessment due to patient motion. No enhancement to suggest acute inflammation. - Early degenerative change of thoracic spine without canal stenosis or neural foraminal narrowing.  07/28/16 cervical spine xray  1. C5 through C7 anterior interbody fusion. Hardware intact. Anatomic alignment. 2. Diffuse osteopenia  and degenerative change.  01/17/17 MRI lumbar  1. Stable T12 cord lesion likely representing demyelinating plaque. 2. Mild lumbar spondylosis greatest at L4-5 and L5-S1 levels. 3. Mild left L4-5 and L5-S1 foraminal stenosis. Small left L5-S1 subarticular disc protrusion with contact on descending left S1 nerve root in the lateral recess.     ASSESSMENT AND PLAN  57 y.o. year old female here with multiple sclerosis, initially on copaxone, then was on Tysabri in the past (3-1/2 years); then became JCV +. Last dose tysabri 10/29/10. Switched to [place] on 04/25/11.   Had brief seizure before starting gilenya, now on levetiracetam.   Then with right V2, V3 herpes zoster on 05/27/11. Gilenya stopped. Shingles resolved.  Then on rebif. Also s/p ACDF C5-6 (Oct 2013) for spinal stenosis.   Now with breast CA diagnosis, s/p right mastectomy, s/p adriamycin, cytoxan, taxol. Now treatments have been completed.   Now back on rebif (since Jan 2017).  Now with new onset headaches (June 2017). MRI brain unremarkable. HA now improving.    Dx:  Multiple sclerosis ([place])  Seizure disorder ([facility])  Other fatigue  Other urinary incontinence     PLAN:    MULTIPLE SCLEROSIS (established problem, stable) - continue rebif  URINARY INCONTINENCE / RECURRENT UTI - continue oxybutynin - follow up with urology  MUSCLE SPASMS - continue baclofen for muscle spasms (stable)  MS fatigue - continue amantadine for fatigue (stable)  SEIZURE DISORDER - continue levetiracetam for seizure d/o (stable)  Meds ordered this encounter  Medications  . oxybutynin (DITROPAN-XL) 10 MG 24 hr tablet    Sig: Take 2 tablets (20 mg total) by mouth daily.    Dispense:  180 tablet    Refill:  3  . levETIRAcetam (KEPPRA) 500 MG tablet    Sig: Take 1 tablet (500 mg total) by mouth 2 (two) times daily.    Dispense:  180 tablet    Refill:  2  . baclofen (LIORESAL) 10 MG tablet    Sig: TAKE 1 TO 2 TABLETS BY  MOUTH 3 TIMES A DAY    Dispense:  270 tablet    Refill:  4  . amantadine (SYMMETREL) 100 MG capsule    Sig: Take 1 capsule (100 mg total) by mouth 3 (three) times daily.    Dispense:  270 capsule    Refill:  4   Return in about 6 months (around 08/13/2017).    Penni Bombard, MD 04/12/4654, 8:12 PM Certified in Neurology, Neurophysiology and Neuroimaging  [facility] [address], [place], [place] [address] [phone]

## 2017-02-13 NOTE — Progress Notes (Signed)
02/14/2017 9:36 AM   Rebecca [name] 1961-01-22 [id number]  Referring provider: [name], MD [address] [place], [place] [address]  Chief Complaint  Patient presents with  . Urinary Retention    HPI: Patient is a 57 year old Caucasian female with MS who went into urinary retention with an UTI referred by Dr. Sanda [name] with her husband, Rebecca [name].    She states that she has been having episodes of inability to void followed by urge incontinence for the last ten years.  She describes this situation as having the feeling to need to void and then not being able to pass urine.  Then when she removes herself from the toilet, the urine spills out.   She is going through 2 to 3 urinary briefs daily.    Her baseline urinary symptoms consist of frequency (goes quit a lot), severe urgency and nocturia x 10.  She denies fevers, chills, nausea or vomiting.  She denies any gross hematuria, suprapubic pain and dysuria.   Her retention episode occurred after she suffered a fall.  She and her husband state that she usually gets weaker when she has an UTI.  On 01/17/2017, she presented to the ED and was found to have 534 mL on bladder scan.  A Foley was placed and 450 mL of malodorous, yellow cloudy urine was returned.  The UA was positive for TNTC RBC's and TNTC WBC's.  Urine culture was positive for MULTIPLE SPECIES PRESENT, SUGGEST RECOLLECTION.    She was given 1 gram of Rocephin in the ED and discharged with 10 days of Keflex.    She stated she felt better with a urinary catheter in place.  She states that she has been on the oxybutynin for a number of years.    Reviewed referral notes.   Rebecca [name] is a 57 y.o. female who comes into the hospital today with some leg numbness and urinary retention.  The patient states that she fell a couple of days ago on her bottom.  Her husband could not get her up so EMS was called and they help to pick her up off of the floor.  The patient states  though that she did not come into the hospital.  She now though has developed some bilateral lower extremity weakness and urinary retention.  She denies any pain at this time.  The patient has a history of MS and has frequent falls.  She reports that she fell backwards but did not hit her head.  She reports that she just feels more weak and can barely hold up her weight on her 2 legs.  She is also had some difficulty urinating when she sits on the toilet.  The patient states that she feels like she has to go but she is unable to do so.  She is here today for evaluation of her symptoms.   PMH: Past Medical History:  Diagnosis Date  . Aortic valve disease    Mild AS / AI - most recent Echo demonstrated tricuspid aortic valve.  . Bacterial endocarditis    History of .  Marland Kitchen Bilateral lower extremity edema    Noncardiac.  Chronic. LE Venous dopplers - negative for DVT.; Echocardiogram January 2016: Normal EF with normal wall motion and valve function. Only grade 1 diastolic dysfunction. EF 60-65%. Mild MR  . Breast cancer ([place]) 12-31-13   Right breast, 12:00, 1.5 cm, T1c,N0 invasive mammary carcinoma, triple negative. --> Rx with Chemo  .  Cervical stenosis of spine   . Herpes zoster   . IBS (irritable bowel syndrome)   . Lymphedema    has legs wrapped at [facility]  . Multiple sclerosis ([place] [facility]) 2001   Walks from room to room @ home; but Wheelchair when going out.  Marland Kitchen Neuromuscular disorder ([place])    MS  . Seizures ([place] [facility])    Takes Keppra  . Syncope and collapse     Surgical History: Past Surgical History:  Procedure Laterality Date  . ANKLE SURGERY     Left  . ANKLE SURGERY    . ANTERIOR CERVICAL DECOMP/DISCECTOMY FUSION  11/17/2011   Procedure: ANTERIOR CERVICAL DECOMPRESSION/DISCECTOMY FUSION 2 LEVELS;  Surgeon: Erline Levine, MD;  Location: [facility];  Service: Neurosurgery;  Laterality: N/A;  Cervical Five-Six Six-Seven Anterior cervical decompression/diskectomy/fusion  . BREAST BIOPSY Right  12-31-13   invasive mammary  . BREAST SURGERY Right 02/03/2014   Right simple mastectomy with sentinel node biopsy.  . CHOLECYSTECTOMY    . COLONOSCOPY  2014  . Lower extremity venous Dopplers  Feb 27, 2013   No LE DVT  . MASTECTOMY    . Port a cath insertion Right 01/19/2010  . PORT-A-CATH REMOVAL     right  . PORT-A-CATH REMOVAL Right 09/03/2013   Procedure: REMOVAL PORT-A-CATH;  Surgeon: Conrad Kilbourne, MD;  Location: [facility];  Service: Vascular;  Laterality: Right;  . TRANSTHORACIC ECHOCARDIOGRAM  03/2013; 02/2014   a) Normal LV size and function with EF 60-65%.; Cannot exclude bicuspid aortic valve with mild AS and mild AI.; b) Normal EF with normal wall motion and valve function x Mild MR. G2 DD. EF 60-65%. Tricuspid AoV  . UPPER GI ENDOSCOPY  2014    Home Medications:  Allergies as of 02/14/2017      Reactions   Fentanyl Nausea And Vomiting, Nausea Only   vomiting Was given in PACU x3 each time patient got sick vomiting Was given in PACU x3 each time patient got sick   Sulfa Antibiotics Hives, Other (See Comments)   Light headed, over heated      Medication List        Accurate as of 02/14/17  9:36 AM. Always use your most recent med list.          amantadine 100 MG capsule Commonly known as:  SYMMETREL Take 1 capsule (100 mg total) by mouth 3 (three) times daily.   baclofen 10 MG tablet Commonly known as:  LIORESAL TAKE 1 TO 2 TABLETS BY MOUTH 3 TIMES A DAY   glucosamine-chondroitin 500-400 MG tablet Take 1 tablet by mouth 4 (four) times daily.   ibuprofen 600 MG tablet Commonly known as:  ADVIL,MOTRIN Take 1 tablet (600 mg total) by mouth every 8 (eight) hours as needed.   Interferon Beta-1a 44 MCG/0.5ML Soaj Commonly known as:  REBIF REBIDOSE Inject 0.5 mLs into the skin 3 (three) times a week.   levETIRAcetam 500 MG tablet Commonly known as:  KEPPRA Take 1 tablet (500 mg total) by mouth 2 (two) times daily.   lidocaine 5 % Commonly known as:   LIDODERM Place 1 patch onto the skin every 12 (twelve) hours. Remove & Discard patch within 12 hours or as directed by MD   MULTIVITAMIN PO Take 1 tablet by mouth 2 (two) times daily.   oxybutynin 10 MG 24 hr tablet Commonly known as:  DITROPAN-XL Take 2 tablets (20 mg total) by mouth daily.   PREVAIL WET WIPES Misc Apply 1 each  topically daily as needed. (dx neurogenic bladder N31.9, LON 99 months)       Allergies:  Allergies  Allergen Reactions  . Fentanyl Nausea And Vomiting and Nausea Only    vomiting Was given in PACU x3 each time patient got sick vomiting Was given in PACU x3 each time patient got sick  . Sulfa Antibiotics Hives and Other (See Comments)    Light headed, over heated    Family History: Family History  Problem Relation Age of Onset  . Cancer Father        skin  . Heart disease Father   . Heart attack Father        heart attack in his 24's  . Thyroid disease Sister   . Ovarian cancer Cousin   . Breast cancer Maternal Aunt 60  . Breast cancer Maternal Grandmother 52  . Bladder Cancer Neg Hx   . Kidney cancer Neg Hx     Social History:  reports that  has never smoked. she has never used smokeless tobacco. She reports that she does not drink alcohol or use drugs.  ROS: UROLOGY Frequent Urination?: Yes Hard to postpone urination?: Yes Burning/pain with urination?: No Get up at night to urinate?: Yes Leakage of urine?: Yes Urine stream starts and stops?: Yes Trouble starting stream?: No Do you have to strain to urinate?: No Blood in urine?: No Urinary tract infection?: Yes Sexually transmitted disease?: No Injury to kidneys or bladder?: No Painful intercourse?: No Weak stream?: No Currently pregnant?: No Vaginal bleeding?: No Last menstrual period?: n  Gastrointestinal Nausea?: No Vomiting?: No Indigestion/heartburn?: No Diarrhea?: No Constipation?: No  Constitutional Fever: No Night sweats?: No Weight loss?: No Fatigue?:  Yes  Skin Skin rash/lesions?: No Itching?: No  Eyes Blurred vision?: No Double vision?: No  Ears/Nose/Throat Sore throat?: No Sinus problems?: No  Hematologic/Lymphatic Swollen glands?: No Easy bruising?: Yes  Cardiovascular Leg swelling?: Yes Chest pain?: No  Respiratory Cough?: No Shortness of breath?: No  Endocrine Excessive thirst?: No  Musculoskeletal Back pain?: No Joint pain?: No  Neurological Headaches?: No Dizziness?: No  Psychologic Depression?: No Anxiety?: No  Physical Exam: BP 124/82 (BP Location: Left Arm, Patient Position: Sitting, Cuff Size: Large)   Pulse 67   Ht 5\' 4"  (1.626 m)   Wt 170 lb (77.1 kg)   BMI 29.18 kg/m   Constitutional: Well nourished. Alert and oriented, No acute distress. HEENT: [place] AT, moist mucus membranes. Trachea midline, no masses. Cardiovascular: No clubbing, cyanosis, or edema. Respiratory: Normal respiratory effort, no increased work of breathing. GI: Abdomen is soft, non tender, non distended, no abdominal masses. Liver and spleen not palpable.  No hernias appreciated.  Stool sample for occult testing is not indicated.   GU: No CVA tenderness.  No bladder fullness or masses.   Atrophic external genitalia, normal pubic hair distribution, no lesions.  Normal urethral meatus, no lesions, no prolapse, no discharge.   No urethral masses, tenderness and/or tenderness. No bladder fullness, tenderness or masses. Erythematous vagina mucosa, good estrogen effect, no discharge, no lesions, good pelvic support, no cystocele or rectocele noted.  No cervical motion tenderness.  Uterus is freely mobile and non-fixed.  No adnexal/parametria masses or tenderness noted.  Anus and perineum are without rashes or lesions.    Skin: No rashes, bruises or suspicious lesions. Lymph: No cervical or inguinal adenopathy. Neurologic: Grossly intact, no focal deficits, moving all 4 extremities. Psychiatric: Normal mood and affect.  Laboratory  Data: Lab Results  Component  Value Date   WBC 9.5 01/17/2017   HGB 13.0 01/17/2017   HCT 38.6 01/17/2017   MCV 90.2 01/17/2017   PLT 129 (L) 01/17/2017    Lab Results  Component Value Date   CREATININE 0.57 01/17/2017    Lab Results  Component Value Date   AST 21 07/28/2016   Lab Results  Component Value Date   ALT 22 07/28/2016    Urinalysis    Component Value Date/Time   COLORURINE AMBER (A) 01/17/2017 0210   APPEARANCEUR CLOUDY (A) 01/17/2017 0210   APPEARANCEUR Cloudy 02/28/2011 1534   LABSPEC 1.011 01/17/2017 0210   LABSPEC 1.014 02/28/2011 1534   PHURINE 7.0 01/17/2017 0210   GLUCOSEU NEGATIVE 01/17/2017 0210   GLUCOSEU Negative 02/28/2011 1534   HGBUR LARGE (A) 01/17/2017 0210   BILIRUBINUR NEGATIVE 01/17/2017 0210   BILIRUBINUR Negative 02/28/2011 1534   KETONESUR NEGATIVE 01/17/2017 0210   PROTEINUR 100 (A) 01/17/2017 0210   NITRITE NEGATIVE 01/17/2017 0210   LEUKOCYTESUR LARGE (A) 01/17/2017 0210   LEUKOCYTESUR Trace 02/28/2011 1534    I have reviewed the labs.  Assessment & Plan:    1. Acute urinary retention:     -foley catheter removed  -voiding trial today    -return if unable to urinate or experiencing suprapubic discomfort  -discontinue the oxybutynin at this time for re-evaluation at baseline - explained that the oxybutynin may not be letting the bladder empty completely  -follow-up in one month for OAB questionnaire and PVR  2. Mixed urinary incontinence  - discontinue the oxybutynin agent - will need to evaluate her bladder function without OAB agent on board  - RTC in one month for OAB questionnaire and PVR - may consider UDS in the future   3. Vaginal atrophy  - not a candidate for vaginal estrogen cream due to history of breast cancer  - she is at increased risk for UTI's due to the vaginal atrophy   Return in about 1 month (around 03/17/2017) for PVR and OAB questionnaire.  These notes generated with voice recognition software. I  apologize for typographical errors.  Zara Council, [facility] [address], [place], [place] [address] [phone]

## 2017-02-14 ENCOUNTER — Encounter: Payer: Self-pay | Admitting: Urology

## 2017-02-14 ENCOUNTER — Ambulatory Visit (INDEPENDENT_AMBULATORY_CARE_PROVIDER_SITE_OTHER): Payer: Medicare Other | Admitting: Urology

## 2017-02-14 VITALS — BP 124/82 | HR 67 | Ht 64.0 in | Wt 170.0 lb

## 2017-02-14 DIAGNOSIS — R338 Other retention of urine: Secondary | ICD-10-CM | POA: Diagnosis not present

## 2017-02-14 DIAGNOSIS — N952 Postmenopausal atrophic vaginitis: Secondary | ICD-10-CM | POA: Diagnosis not present

## 2017-02-14 DIAGNOSIS — N3946 Mixed incontinence: Secondary | ICD-10-CM | POA: Diagnosis not present

## 2017-02-14 NOTE — Patient Instructions (Addendum)
                                             Urinary Tract Infection Prevention Patient Education Stay Hydrated: Urinary tract infections (UTIs) are less likely to occur in someone who is drinking enough water to promote regular urination, so it is very important to stay hydrated in order to help flush out bacteria from the urinary tract. Respond to "Nature's Call": It is always a good idea to urinate as soon as you feel the need. While "holding it in" does not directly cause an infection, it can cause overdistension that can damage the lining of the bladder, making it more vulnerable to bacteria. Remove Tampons Before Going: Remember to always take out tampons before urinating, and change tampons often.  Practice Proper Bathroom Hygiene: To keep bacteria near the urethral opening to a minimum, it is important to practice proper wiping techniques (i.e. front to back wiping) to help prevent rectal bacteria from entering the uretro-genital area. It can also be helpful to take showers and avoid soaking in the bathtub.  Take a Vitamin C Supplement: About 1,000 milligrams of vitamin C taken daily can help inhibit the growth of some bacteria by acidifying the urine. Maintain Control with Cranberries: Cranberries contain hippuronic acid, which is a natural antiseptic that may help prevent the adherence of bacteria to the bladder lining. Drinking 100% pure cranberry juice or taking over the counter cranberry supplements twice daily may help to prevent an infection. However, it is important to note that cranberry juices/supplements are not helpful once a urinary tract infection (UTI) is present. Strengthen Your Core: Often, a lazy bladder (unable to empty urine properly) occurs due to lower back problem, so consider doing exercises to help strengthen your back, pelvic floor, and stomach muscles.  Pay Attention to Your Urine: Your urine can change color for a variety of reasons, including from the medications you  take, so pay close attention to it to monitor your overall health. One key thing to note is that if your urine is typically a darker yellow, your body is dehydrated, so you need to step up your water intake.   Stop the oxybutynin (Ditropan)

## 2017-02-14 NOTE — Progress Notes (Signed)
Catheter Removal  Patient is present today for a catheter removal.  10ml of water was drained from the balloon. A 16FR foley cath was removed from the bladder no complications were noted . Patient tolerated well.  Preformed by: Luria Rosario, CMA    

## 2017-02-16 ENCOUNTER — Ambulatory Visit: Payer: Medicare Other

## 2017-02-16 ENCOUNTER — Other Ambulatory Visit: Payer: Self-pay | Admitting: Urology

## 2017-02-16 VITALS — BP 165/84 | HR 76 | Ht 64.0 in | Wt 170.0 lb

## 2017-02-16 DIAGNOSIS — R350 Frequency of micturition: Secondary | ICD-10-CM

## 2017-02-16 DIAGNOSIS — R300 Dysuria: Secondary | ICD-10-CM | POA: Diagnosis not present

## 2017-02-16 LAB — URINALYSIS, COMPLETE
Bilirubin, UA: NEGATIVE
Glucose, UA: NEGATIVE
Nitrite, UA: POSITIVE — AB
Specific Gravity, UA: 1.025 (ref 1.005–1.030)
Urobilinogen, Ur: 0.2 mg/dL (ref 0.2–1.0)
pH, UA: 5.5 (ref 5.0–7.5)

## 2017-02-16 LAB — MICROSCOPIC EXAMINATION
Epithelial Cells (non renal): NONE SEEN /hpf (ref 0–10)
RBC, UA: NONE SEEN /hpf (ref 0–?)

## 2017-02-16 MED ORDER — AMOXICILLIN-POT CLAVULANATE 875-125 MG PO TABS: 1.0000 | ORAL_TABLET | Freq: Two times a day (BID) | ORAL | 0 refills | Status: DC

## 2017-02-16 NOTE — Progress Notes (Signed)
Urine is sent for culture.  I have sent Augmentin 875/125 to her pharmacy.  I do not recommend restarting the oxybutynin.

## 2017-02-16 NOTE — Progress Notes (Signed)
In and Out Catheterization  Patient is present today for a I & O catheterization due to urinary frequency and dysuria. Patient was cleaned and prepped in a sterile fashion with betadine and Lidocaine 2% jelly was instilled into the urethra.  A 14FR cath was inserted no complications were noted , 13ml of urine return was noted, urine was cloudy yellow in color. A clean urine sample was collected for UA and CX. Bladder was drained  And catheter was removed with out difficulty.    Preformed by: Fonnie Jarvis, CMA

## 2017-02-19 NOTE — Progress Notes (Deleted)
[place]  [phone] Fax:(336) [phone]  ID: [name] OB: [birth year]  MR#: [id number]  QIW#:[id number]  Patient Care Team: [name], MD as PCP - General (Family Medicine) Bobetta Lime, MD as Referring Physician Byrnett, Forest Gleason, MD (General Surgery) Penni Bombard, MD as Consulting Physician (Neurology) Lloyd Huger, MD as Consulting Physician (Oncology) Leonie Man, MD as Consulting Physician (Cardiology) Minna Merritts, MD as Consulting Physician (Cardiology)  CHIEF COMPLAINT:  No chief complaint on file.   INTERVAL HISTORY: Patient returns to clinic today for routine evaluation. She currently feels well and is asymptomatic. Her pain is currently well-controlled. Patient states her peripheral neuropathy continues to improve. Her lower extremity edema is also significantly improved. She complains of intermittent headache but feels it may be related to stopping drinking coffee.  She denies any chest pain or shortness of breath. She denies any nausea, vomiting, constipation, or diarrhea. She complains of urinary urgency, no other urinary complaints. Patient offers no further specific complaints.   REVIEW OF SYSTEMS:   Review of Systems  Constitutional: Negative for chills, fever and malaise/fatigue.  Respiratory: Negative for cough and shortness of breath.   Cardiovascular: Negative.  Negative for chest pain and leg swelling.  Gastrointestinal: Positive for nausea. Negative for abdominal pain, constipation, diarrhea and vomiting.  Genitourinary: Positive for urgency.  Neurological: Positive for headaches. Negative for weakness.  Psychiatric/Behavioral: Negative.  The patient is not nervous/anxious.     As per HPI. Otherwise, a complete review of systems is negative.  PAST MEDICAL HISTORY: Past Medical History:  Diagnosis Date  . Aortic valve disease    Mild AS / AI - most recent Echo demonstrated tricuspid  aortic valve.  . Bacterial endocarditis    History of .  Marland Kitchen Bilateral lower extremity edema    Noncardiac.  Chronic. LE Venous dopplers - negative for DVT.; Echocardiogram January 2016: Normal EF with normal wall motion and valve function. Only grade 1 diastolic dysfunction. EF 60-65%. Mild MR  . Breast cancer ([place]) 12-31-13   Right breast, 12:00, 1.5 cm, T1c,N0 invasive mammary carcinoma, triple negative. --> Rx with Chemo  . Cervical stenosis of spine   . Herpes zoster   . IBS (irritable bowel syndrome)   . Lymphedema    has legs [facility] at [facility]  . Multiple sclerosis ([place]) 2001   Walks from room to room @ home; but Wheelchair when going out.  Marland Kitchen Neuromuscular disorder ([place])    MS  . Seizures ([place])    Takes Keppra  . Syncope and collapse     PAST SURGICAL HISTORY: Past Surgical History:  Procedure Laterality Date  . ANKLE SURGERY     Left  . ANKLE SURGERY    . ANTERIOR CERVICAL DECOMP/DISCECTOMY FUSION  11/17/2011   Procedure: ANTERIOR CERVICAL DECOMPRESSION/DISCECTOMY FUSION 2 LEVELS;  Surgeon: Erline Levine, MD;  Location: [place] [facility] OR[facility];  Service: Neurosurgery;  Laterality: N/A;  Cervical Five-Six Six-Seven Anterior cervical decompression/diskectomy/fusion  . BREAST BIOPSY Right 12-31-13   invasive mammary  . BREAST SURGERY Right 02/03/2014   Right simple mastectomy with sentinel node biopsy.  . CHOLECYSTECTOMY    . COLONOSCOPY  2014  . Lower extremity venous Dopplers  Feb 27, 2013   No LE DVT  . MASTECTOMY    . Port a cath insertion Right 01/19/2010  . PORT-A-CATH REMOVAL     right  . PORT-A-CATH REMOVAL Right 09/03/2013   Procedure: REMOVAL PORT-A-CATH;  Surgeon: Conrad Albion, MD;  Location: [facility];  Service: Vascular;  Laterality: Right;  . TRANSTHORACIC ECHOCARDIOGRAM  03/2013; 02/2014   a) Normal LV size and function with EF 60-65%.; Cannot exclude bicuspid aortic valve with mild AS and mild AI.; b) Normal EF with normal wall motion and valve function x Mild MR. G2 DD. EF  60-65%. Tricuspid AoV  . UPPER GI ENDOSCOPY  2014    FAMILY HISTORY Family History  Problem Relation Age of Onset  . Cancer Father        skin  . Heart disease Father   . Heart attack Father        heart attack in his 23's  . Thyroid disease Sister   . Ovarian cancer Cousin   . Breast cancer Maternal Aunt 60  . Breast cancer Maternal Grandmother 62  . Bladder Cancer Neg Hx   . Kidney cancer Neg Hx        ADVANCED DIRECTIVES:    HEALTH MAINTENANCE: Social History   Tobacco Use  . Smoking status: Never Smoker  . Smokeless tobacco: Never Used  Substance Use Topics  . Alcohol use: No  . Drug use: No     Colonoscopy:  PAP:  Bone density:  Lipid panel:  Allergies  Allergen Reactions  . Fentanyl Nausea And Vomiting and Nausea Only    vomiting Was given in PACU x3 each time patient got sick vomiting Was given in PACU x3 each time patient got sick  . Sulfa Antibiotics Hives and Other (See Comments)    Light headed, over heated    Current Outpatient Medications  Medication Sig Dispense Refill  . amantadine (SYMMETREL) 100 MG capsule Take 1 capsule (100 mg total) by mouth 3 (three) times daily. 270 capsule 4  . amoxicillin-clavulanate (AUGMENTIN) 875-125 MG tablet Take 1 tablet by mouth every 12 (twelve) hours. 14 tablet 0  . baclofen (LIORESAL) 10 MG tablet TAKE 1 TO 2 TABLETS BY MOUTH 3 TIMES A DAY 270 tablet 4  . glucosamine-chondroitin 500-400 MG tablet Take 1 tablet by mouth 4 (four) times daily.    Marland Kitchen ibuprofen (ADVIL,MOTRIN) 600 MG tablet Take 1 tablet (600 mg total) by mouth every 8 (eight) hours as needed. 60 tablet 5  . Incontinence Supply Disposable (PREVAIL WET WIPES) MISC Apply 1 each topically daily as needed. (dx neurogenic bladder N31.9, LON 99 months) 96 each 11  . Interferon Beta-1a (REBIF REBIDOSE) 44 MCG/0.5ML SOAJ Inject 0.5 mLs into the skin 3 (three) times a week. (Patient taking differently: Inject 0.5 mLs into the skin 3 (three) times a week.  Monday, Wednesday and Friday) 18 mL 4  . levETIRAcetam (KEPPRA) 500 MG tablet Take 1 tablet (500 mg total) by mouth 2 (two) times daily. 180 tablet 2  . lidocaine (LIDODERM) 5 % Place 1 patch onto the skin every 12 (twelve) hours. Remove & Discard patch within 12 hours or as directed by MD 10 patch 0  . Multiple Vitamins-Minerals (MULTIVITAMIN PO) Take 1 tablet by mouth 2 (two) times daily.     Marland Kitchen oxybutynin (DITROPAN-XL) 10 MG 24 hr tablet Take 2 tablets (20 mg total) by mouth daily. 180 tablet 3   No current facility-administered medications for this visit.     OBJECTIVE: There were no vitals filed for this visit.   There is no height or weight on file to calculate BMI.    ECOG FS:1 - Symptomatic but completely ambulatory  General: Well-developed, well-nourished, no acute distress. Sitting in a wheelchair. Eyes: anicteric sclera. Breasts:  Patient requested exam be deferred today. Lungs: Clear to auscultation bilaterally. Heart: Regular rate and rhythm. No rubs, murmurs, or gallops. Abdomen: Soft, nontender, nondistended. No organomegaly noted, normoactive bowel sounds. Musculoskeletal: Bilateral lower extremity lymphedema. Neuro: Alert, answering all questions appropriately. Cranial nerves grossly intact. Skin: No rashes or petechiae noted. Psych: Normal affect.   LAB RESULTS:  Lab Results  Component Value Date   NA 137 01/17/2017   K 3.6 01/17/2017   CL 102 01/17/2017   CO2 23 01/17/2017   GLUCOSE 132 (H) 01/17/2017   BUN 12 01/17/2017   CREATININE 0.57 01/17/2017   CALCIUM 9.3 01/17/2017   PROT 7.0 07/28/2016   ALBUMIN 4.3 07/28/2016   AST 21 07/28/2016   ALT 22 07/28/2016   ALKPHOS 84 07/28/2016   BILITOT 0.3 07/28/2016   GFRNONAA >60 01/17/2017   GFRAA >60 01/17/2017    Lab Results  Component Value Date   WBC 9.5 01/17/2017   NEUTROABS 3.3 06/27/2016   HGB 13.0 01/17/2017   HCT 38.6 01/17/2017   MCV 90.2 01/17/2017   PLT 129 (L) 01/17/2017   Lab Results    Component Value Date   LABCA2 18.0 08/02/2016     STUDIES: No results found.  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 BRCA negative.  PLAN:    1. Triple negative right breast cancer: No evidence of disease. CA-27-29 continues to be within normal limits. Because patient had a full mastectomy, she did not require adjuvant XRT. Given the fact that she is a triple negative cancer, she will not require an aromatase inhibitor. Given her difficulties with Taxol and multiple sclerosis, treatment was discontinued after a total of 6 of 12 cycles of Taxol. Her last chemotherapy was July 17, 2014. Mammogram of her left breast on March 11, 2015 was reported as BIRADS 1, repeat in one year.  Return to clinic in 6 months for laboratory work and further evaluation.  2. Multiple sclerosis: Continue evaluation and current treatment per neurology. 3. Lymphedema: Continue treatment and wraps as prescribed. 4. Peripheral neuropathy: Improved. Monitor. 5. Pain: Continue fentanyl patch to 50 g and 10 mg oxycodone as needed for breakthrough pain.  6. Nausea: Continue compazine 10 mg as needed for nausea 7. Headache: Trial drinking tea rather than coffee to see if headaches subside.  Her primary neurologist is Dr. Andrey Spearman.  phone [phone], fax [phone].   Patient expressed understanding and was in agreement with this plan. She also understands that She can call clinic at any time with any questions, concerns, or complaints.   Breast cancer   Staging form: Breast, AJCC 7th Edition     Pathologic stage from 05/24/2014: Stage IA (T1c, N0, cM0) - Signed by Lloyd Huger, MD on 05/24/2014   Lloyd Huger, MD   02/19/2017 2:22 PM

## 2017-02-21 ENCOUNTER — Inpatient Hospital Stay: Payer: Medicare Other

## 2017-02-21 ENCOUNTER — Inpatient Hospital Stay: Payer: Medicare Other | Admitting: Oncology

## 2017-02-21 ENCOUNTER — Telehealth: Payer: Self-pay | Admitting: Urology

## 2017-02-21 NOTE — Telephone Encounter (Signed)
Spoke with [facility] who stated they will fax a prelim now and should have final results tomorrow.

## 2017-02-21 NOTE — Telephone Encounter (Signed)
Would you call [facility] and see if they have a preliminary result on the urine culture?

## 2017-02-22 ENCOUNTER — Telehealth: Payer: Self-pay

## 2017-02-22 LAB — CULTURE, URINE COMPREHENSIVE

## 2017-02-22 MED ORDER — NITROFURANTOIN MONOHYD MACRO 100 MG PO CAPS: 100.0000 mg | ORAL_CAPSULE | Freq: Two times a day (BID) | ORAL | 0 refills | Status: DC

## 2017-02-22 NOTE — Telephone Encounter (Signed)
Spoke with pt husband and made aware to stop current abx and start macrobid. Husband voiced understanding. Abx sent to pharmacy.

## 2017-02-22 NOTE — Telephone Encounter (Signed)
-----   Message from Nori Riis, PA-C sent at 02/22/2017  1:44 PM EST ----- Please let the [facility] know that her urine culture is positive and we need to change the antibiotic to Macrobid, 100 mg bid x 7 days.

## 2017-02-27 NOTE — Progress Notes (Signed)
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 BRCA negative.  INTERVAL HISTORY: Patient returns to clinic today for routine 6 month evaluation. She currently feels well and is at her baseline.  She has discontinued narcotics and does not complain of pain today. Her lower extremity edema is unchanged. She has no new neurologic complaints.  She denies any chest pain or shortness of breath. She denies any nausea, vomiting, constipation, or diarrhea. She has recently had several UTIs and is currently being evaluated by urology. Patient offers no further specific complaints.   REVIEW OF SYSTEMS:   Review of Systems  Constitutional: Negative for chills, fever and malaise/fatigue.  Respiratory: Negative for cough and shortness of breath.   Cardiovascular: Negative.  Negative for chest pain and leg swelling.  Gastrointestinal: Negative for abdominal pain, constipation, diarrhea, nausea and vomiting.  Genitourinary: Positive for dysuria, frequency and urgency.  Musculoskeletal: Positive for back pain.  Skin: Negative.  Negative for rash.  Neurological: Positive for focal weakness. Negative for weakness and headaches.  Psychiatric/Behavioral: Negative.  The patient is not nervous/anxious.     As per HPI. Otherwise, a complete review of systems is negative.  PAST MEDICAL HISTORY: Past Medical History:  Diagnosis Date  . Aortic  valve disease    Mild AS / AI - most recent Echo demonstrated tricuspid aortic valve.  . Bacterial endocarditis    History of .  Marland Kitchen Bilateral lower extremity edema    Noncardiac.  Chronic. LE Venous dopplers - negative for DVT.; Echocardiogram January 2016: Normal EF with normal wall motion and valve function. Only grade 1 diastolic dysfunction. EF 60-65%. Mild MR  . Breast cancer ([place]) 12-31-13   Right breast, 12:00, 1.5 cm, T1c,N0 invasive mammary carcinoma, triple negative. --> Rx with Chemo  . Cervical stenosis of spine   . Herpes zoster   . IBS (irritable bowel syndrome)   . Lymphedema    has legs wrapped at [facility]  . Multiple sclerosis ([place]) 2001   Walks from room to room @ home; but Wheelchair when going out.  Marland Kitchen Neuromuscular disorder ([place])    MS  . Seizures ([place])    Takes Keppra  . Syncope and collapse     PAST SURGICAL HISTORY: Past Surgical History:  Procedure Laterality Date  . ANKLE SURGERY     Left  . ANKLE SURGERY    . ANTERIOR CERVICAL DECOMP/DISCECTOMY FUSION  11/17/2011   Procedure: ANTERIOR CERVICAL DECOMPRESSION/DISCECTOMY FUSION 2 LEVELS;  Surgeon: Erline Levine, MD;  Location: [place] [facility] OR[facility];  Service: Neurosurgery;  Laterality: N/A;  Cervical Five-Six Six-Seven Anterior cervical decompression/diskectomy/fusion  . BREAST BIOPSY Right 12-31-13   invasive mammary  . BREAST SURGERY Right 02/03/2014   Right simple mastectomy with sentinel node biopsy.  . CHOLECYSTECTOMY    . COLONOSCOPY  2014  . Lower extremity venous Dopplers  Feb 27, 2013   No LE DVT  . MASTECTOMY    . Port a cath insertion Right 01/19/2010  . PORT-A-CATH REMOVAL     right  .  PORT-A-CATH REMOVAL Right 09/03/2013   Procedure: REMOVAL PORT-A-CATH;  Surgeon: Conrad Rensselaer, MD;  Location: [place];  Service: Vascular;  Laterality: Right;  . TRANSTHORACIC ECHOCARDIOGRAM  03/2013; 02/2014   a) Normal LV size and function with EF 60-65%.; Cannot exclude bicuspid aortic valve with mild AS and mild AI.; b)  Normal EF with normal wall motion and valve function x Mild MR. G2 DD. EF 60-65%. Tricuspid AoV  . UPPER GI ENDOSCOPY  2014    FAMILY HISTORY Family History  Problem Relation Age of Onset  . Cancer Father        skin  . Heart disease Father   . Heart attack Father        heart attack in his 31's  . Thyroid disease Sister   . Ovarian cancer Cousin   . Breast cancer Maternal Aunt 60  . Breast cancer Maternal Grandmother 62  . Bladder Cancer Neg Hx   . Kidney cancer Neg Hx        ADVANCED DIRECTIVES:    HEALTH MAINTENANCE: Social History   Tobacco Use  . Smoking status: Never Smoker  . Smokeless tobacco: Never Used  Substance Use Topics  . Alcohol use: No  . Drug use: No     Colonoscopy:  PAP:  Bone density:  Lipid panel:  Allergies  Allergen Reactions  . Fentanyl Nausea And Vomiting and Nausea Only    vomiting Was given in PACU x3 each time patient got sick vomiting Was given in PACU x3 each time patient got sick  . Sulfa Antibiotics Hives and Other (See Comments)    Light headed, over heated    Current Outpatient Medications  Medication Sig Dispense Refill  . amantadine (SYMMETREL) 100 MG capsule Take 1 capsule (100 mg total) by mouth 3 (three) times daily. 270 capsule 4  . amoxicillin-clavulanate (AUGMENTIN) 875-125 MG tablet Take 1 tablet by mouth every 12 (twelve) hours. 14 tablet 0  . baclofen (LIORESAL) 10 MG tablet TAKE 1 TO 2 TABLETS BY MOUTH 3 TIMES A DAY 270 tablet 4  . glucosamine-chondroitin 500-400 MG tablet Take 1 tablet by mouth 4 (four) times daily.    Marland Kitchen ibuprofen (ADVIL,MOTRIN) 600 MG tablet Take 1 tablet (600 mg total) by mouth every 8 (eight) hours as needed. 60 tablet 5  . Incontinence Supply Disposable (PREVAIL WET WIPES) MISC Apply 1 each topically daily as needed. (dx neurogenic bladder N31.9, LON 99 months) 96 each 11  . Interferon Beta-1a (REBIF REBIDOSE) 44 MCG/0.5ML SOAJ Inject 0.5 mLs into the skin 3 (three) times a week. 18 mL 4    . levETIRAcetam (KEPPRA) 500 MG tablet Take 1 tablet (500 mg total) by mouth 2 (two) times daily. 180 tablet 2  . Multiple Vitamins-Minerals (MULTIVITAMIN PO) Take 1 tablet by mouth 2 (two) times daily.     . nitrofurantoin, macrocrystal-monohydrate, (MACROBID) 100 MG capsule Take 1 capsule (100 mg total) by mouth every 12 (twelve) hours. 14 capsule 0  . oxybutynin (DITROPAN-XL) 10 MG 24 hr tablet Take 2 tablets (20 mg total) by mouth daily. 180 tablet 3  . lidocaine (LIDODERM) 5 % Place 1 patch onto the skin every 12 (twelve) hours. Remove & Discard patch within 12 hours or as directed by MD (Patient not taking: Reported on 03/01/2017) 10 patch 0   No current facility-administered medications for this visit.     OBJECTIVE: Vitals:   03/01/17 1506  BP: 118/62  Pulse: 61  Resp: 18  Temp: 97.8 F (  36.6 C)     There is no height or weight on file to calculate BMI.    ECOG FS:2 - Symptomatic, <50% confined to bed  General: Well-developed, well-nourished, no acute distress. Sitting in a wheelchair. Eyes: anicteric sclera.  Breasts: Patient requested exam be deferred today. Lungs: Clear to auscultation bilaterally. Heart: Regular rate and rhythm. No rubs, murmurs, or gallops. Abdomen: Soft, nontender, nondistended. No organomegaly noted, normoactive bowel sounds. Musculoskeletal: Bilateral lower extremity lymphedema. Neuro: Alert, answering all questions appropriately. Cranial nerves grossly intact. Skin: No rashes or petechiae noted. Psych: Normal affect.   LAB RESULTS:  Lab Results  Component Value Date   NA 137 01/17/2017   K 3.6 01/17/2017   CL 102 01/17/2017   CO2 23 01/17/2017   GLUCOSE 132 (H) 01/17/2017   BUN 12 01/17/2017   CREATININE 0.57 01/17/2017   CALCIUM 9.3 01/17/2017   PROT 7.0 07/28/2016   ALBUMIN 4.3 07/28/2016   AST 21 07/28/2016   ALT 22 07/28/2016   ALKPHOS 84 07/28/2016   BILITOT 0.3 07/28/2016   GFRNONAA >60 01/17/2017   GFRAA >60 01/17/2017     Lab Results  Component Value Date   WBC 9.5 01/17/2017   NEUTROABS 3.3 06/27/2016   HGB 13.0 01/17/2017   HCT 38.6 01/17/2017   MCV 90.2 01/17/2017   PLT 129 (L) 01/17/2017   Lab Results  Component Value Date   LABCA2 18.0 08/02/2016     STUDIES: No results found.  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 BRCA negative.  PLAN:    1. Triple negative right breast cancer: No evidence of disease. CA-27-29 continues to be within normal limits, today's result is pending. Because patient had a full mastectomy, she did not require adjuvant XRT. Given the fact that she is a triple negative cancer, she will not require an aromatase inhibitor. Given her difficulties with Taxol and multiple sclerosis, treatment was discontinued after a total of 6 of 12 cycles of Taxol. Her last chemotherapy was July 17, 2014. Screening mammogram of her left breast on July 20, 2016 was reported as BIRADS 1, repeat in June 2019.  Return to clinic in 6 months for laboratory work and further evaluation.  2. Multiple sclerosis: Continue evaluation and current treatment per neurology. Her primary neurologist is Dr. Andrey Spearman.  phone [phone], fax [phone]. 3. Lymphedema: Continue treatment and wraps as prescribed. 4. Peripheral neuropathy: Improved. Monitor. 5. Pain: Patient is no longer taking narcotics.  Approximately 20 minutes spent in discussion of which greater than 50% was consultation.   Patient expressed understanding and was in agreement with this plan. She also understands that She can call clinic at any time with any questions, concerns, or complaints.   Breast cancer   Staging form: Breast, AJCC 7th Edition     Pathologic stage from 05/24/2014: Stage IA (T1c, N0, cM0) - Signed by Lloyd Huger, MD on 05/24/2014   Lloyd Huger, MD   03/01/2017 3:31 PM

## 2017-03-01 ENCOUNTER — Inpatient Hospital Stay (HOSPITAL_BASED_OUTPATIENT_CLINIC_OR_DEPARTMENT_OTHER): Payer: Medicare Other | Admitting: Oncology

## 2017-03-01 ENCOUNTER — Inpatient Hospital Stay: Payer: Medicare Other | Attending: Oncology

## 2017-03-01 ENCOUNTER — Encounter: Payer: Self-pay | Admitting: Oncology

## 2017-03-01 ENCOUNTER — Other Ambulatory Visit: Payer: Self-pay

## 2017-03-01 VITALS — BP 118/62 | HR 61 | Temp 97.8°F | Resp 18

## 2017-03-01 DIAGNOSIS — G35 Multiple sclerosis: Secondary | ICD-10-CM | POA: Diagnosis not present

## 2017-03-01 DIAGNOSIS — Z9010 Acquired absence of unspecified breast and nipple: Secondary | ICD-10-CM | POA: Diagnosis not present

## 2017-03-01 DIAGNOSIS — G629 Polyneuropathy, unspecified: Secondary | ICD-10-CM | POA: Diagnosis not present

## 2017-03-01 DIAGNOSIS — I890 Lymphedema, not elsewhere classified: Secondary | ICD-10-CM

## 2017-03-01 DIAGNOSIS — C50911 Malignant neoplasm of unspecified site of right female breast: Secondary | ICD-10-CM

## 2017-03-01 DIAGNOSIS — Z95828 Presence of other vascular implants and grafts: Secondary | ICD-10-CM

## 2017-03-01 DIAGNOSIS — Z171 Estrogen receptor negative status [ER-]: Secondary | ICD-10-CM

## 2017-03-01 DIAGNOSIS — Z853 Personal history of malignant neoplasm of breast: Secondary | ICD-10-CM | POA: Diagnosis not present

## 2017-03-01 DIAGNOSIS — Z9221 Personal history of antineoplastic chemotherapy: Secondary | ICD-10-CM

## 2017-03-01 MED ORDER — HEPARIN SOD (PORK) LOCK FLUSH 100 UNIT/ML IV SOLN
500.0000 [IU] | INTRAVENOUS | Status: AC | PRN
  Administered 2017-03-01: 500 [IU]

## 2017-03-01 MED ORDER — SODIUM CHLORIDE 0.9% FLUSH
10.0000 mL | INTRAVENOUS | Status: AC | PRN
  Administered 2017-03-01: 10 mL
  Filled 2017-03-01: qty 10

## 2017-03-01 NOTE — Progress Notes (Signed)
Patient denies any concerns today.  

## 2017-03-02 LAB — CANCER ANTIGEN 27.29: CAN 27.29: 20.4 U/mL (ref 0.0–38.6)

## 2017-03-08 ENCOUNTER — Other Ambulatory Visit: Payer: Self-pay

## 2017-03-08 NOTE — Telephone Encounter (Signed)
Ins. Requesting 90 day 

## 2017-03-09 MED ORDER — IBUPROFEN 600 MG PO TABS: 600.0000 mg | ORAL_TABLET | Freq: Three times a day (TID) | ORAL | 1 refills | Status: DC | PRN

## 2017-03-09 NOTE — Telephone Encounter (Signed)
Last CBC and Cr reviewed; Rx approved

## 2017-03-17 ENCOUNTER — Encounter: Payer: Medicare Other | Admitting: Obstetrics and Gynecology

## 2017-03-21 ENCOUNTER — Ambulatory Visit: Payer: Medicare Other | Admitting: Urology

## 2017-03-22 ENCOUNTER — Telehealth: Payer: Self-pay | Admitting: Diagnostic Neuroimaging

## 2017-03-22 NOTE — Telephone Encounter (Addendum)
[place] clinical pharmacy, spoke with rep who stated she found no record of patient using Medicaid ID, name, DOB.  Called patient who stated she has [employer] through her Medicare, [phone].   Called [employer] again, spoke with Merry Proud to initiate PA for Rebif. He stated Rebif is a non formulary drug and requires questions answered. He stated a PA has already been initiated, Ref [id number]. He connected this RN to Gregary Signs; clinical questions answered, Gregary Signs placed PA as urgent, stated office will get a faxed decision in 24 hours.

## 2017-03-22 NOTE — Telephone Encounter (Signed)
Pt calling stating that [place] needs to speak with Dr Leta Baptist re: Prior Authorization needed for pt'sInterferon Beta-1a (REBIF REBIDOSE) 26 MCG/0.5ML SOAJ Pt states [place] needs to be called at [phone]

## 2017-03-23 NOTE — Telephone Encounter (Signed)
Received fax from [facility], Rebif approved until 02/06/18.

## 2017-03-27 ENCOUNTER — Ambulatory Visit: Payer: Medicare Other | Admitting: Urology

## 2017-04-03 ENCOUNTER — Encounter: Payer: Self-pay | Admitting: Urology

## 2017-04-03 ENCOUNTER — Ambulatory Visit (INDEPENDENT_AMBULATORY_CARE_PROVIDER_SITE_OTHER): Payer: Medicare Other | Admitting: Urology

## 2017-04-03 VITALS — BP 130/86 | HR 76 | Ht 64.0 in | Wt 170.0 lb

## 2017-04-03 DIAGNOSIS — Z87440 Personal history of urinary (tract) infections: Secondary | ICD-10-CM | POA: Diagnosis not present

## 2017-04-03 DIAGNOSIS — R339 Retention of urine, unspecified: Secondary | ICD-10-CM | POA: Diagnosis not present

## 2017-04-03 DIAGNOSIS — N3946 Mixed incontinence: Secondary | ICD-10-CM | POA: Diagnosis not present

## 2017-04-03 DIAGNOSIS — Z87898 Personal history of other specified conditions: Secondary | ICD-10-CM | POA: Diagnosis not present

## 2017-04-03 DIAGNOSIS — N952 Postmenopausal atrophic vaginitis: Secondary | ICD-10-CM | POA: Diagnosis not present

## 2017-04-03 LAB — URINALYSIS, COMPLETE
Bilirubin, UA: NEGATIVE
Glucose, UA: NEGATIVE
LEUKOCYTES UA: NEGATIVE
Nitrite, UA: NEGATIVE
Protein, UA: NEGATIVE
RBC, UA: NEGATIVE
Specific Gravity, UA: >1.03 — ABNORMAL HIGH (ref 1.005–1.030)
Urobilinogen, Ur: 0.2 mg/dL (ref 0.2–1.0)
pH, UA: 5.5 (ref 5.0–7.5)

## 2017-04-03 LAB — MICROSCOPIC EXAMINATION
Bacteria, UA: NONE SEEN
RBC MICROSCOPIC, UA: NONE SEEN /HPF (ref 0–?)

## 2017-04-03 LAB — BLADDER SCAN AMB NON-IMAGING: Scan Result: 270

## 2017-04-03 NOTE — Progress Notes (Signed)
04/03/2017 2:31 PM   Rebecca [name] 18-May-1960 [id number]  Referring provider: [name], MD [address] [place], [place] [address]  Chief Complaint  Patient presents with  . Urinary Retention    HPI: 57 yo WF with MS with a history of urinary retention associated with an UTI who presents today after a drug holiday from her oxybutynin.    Background history Patient is a 57 year old Caucasian female with MS who went into urinary retention with an UTI referred by Dr. Sanda [name] with her husband, Rebecca [name].  She states that she has been having episodes of inability to void followed by urge incontinence for the last ten years.  She describes this situation as having the feeling to need to void and then not being able to pass urine.  Then when she removes herself from the toilet, the urine spills out.   She is going through 2 to 3 urinary briefs daily.  Her baseline urinary symptoms consist of frequency (goes quit a lot), severe urgency and nocturia x 10.  She denies fevers, chills, nausea or vomiting.  She denies any gross hematuria, suprapubic pain and dysuria.  Her retention episode occurred after she suffered a fall.  She and her husband state that she usually gets weaker when she has an UTI.  On 01/17/2017, she presented to the ED and was found to have 534 mL on bladder scan.  A Foley was placed and 450 mL of malodorous, yellow cloudy urine was returned.  The UA was positive for TNTC RBC's and TNTC WBC's.  Urine culture was positive for MULTIPLE SPECIES PRESENT, SUGGEST RECOLLECTION.  She was given 1 gram of Rocephin in the ED and discharged with 10 days of Keflex.  She stated she felt better with a urinary catheter in place.  She states that she has been on the oxybutynin for a number of years.   Reviewed referral notes.   Rebecca [name] is a 57 y.o. female who comes into the hospital today with some leg numbness and urinary retention.  The patient states that she fell a couple of days  ago on her bottom.  Her husband could not get her up so EMS was called and they help to pick her up off of the floor.  The patient states though that she did not come into the hospital.  She now though has developed some bilateral lower extremity weakness and urinary retention.  She denies any pain at this time.  The patient has a history of MS and has frequent falls.  She reports that she fell backwards but did not hit her head.  She reports that she just feels more weak and can barely hold up her weight on her 2 legs.  She is also had some difficulty urinating when she sits on the toilet. The patient states that she feels like she has to go but she is unable to do so.  She is here today for evaluation of her symptoms.  At her visit on 02/15/2016, we discontinued the oxybutynin and discussed UTI prevention strategies.  2 days later, her husband contacted the office stating the patient was experiencing frequency and dysuria.  Cath UA was collected for UA and culture.  Her urine culture was positive for E. Coli.  She was treated with the appropriate antibiotic.    Her urinary frequency and nocturia worsened, so they reinstated her oxybutynin and her urinary symptoms have improved.  The patient has been  experiencing urgency x 4-7 (improved), frequency x 4-7 (patient states she is better), is restricting fluids to avoid visits to the restroom, is engaging in toilet mapping, incontinence x 4-7 (stable) and nocturia x 4-7 (stable).  Patient denies any gross hematuria, dysuria or suprapubic/flank pain.  Patient denies any fevers, chills, nausea or vomiting.   Her UA today is clear.  Her PVR is 270 mL.    PMH: Past Medical History:  Diagnosis Date  . Aortic valve disease    Mild AS / AI - most recent Echo demonstrated tricuspid aortic valve.  . Bacterial endocarditis    History of .  Marland Kitchen Bilateral lower extremity edema    Noncardiac.  Chronic. LE Venous dopplers - negative for DVT.; Echocardiogram January 2016:  Normal EF with normal wall motion and valve function. Only grade 1 diastolic dysfunction. EF 60-65%. Mild MR  . Breast cancer ([place]) 12-31-13   Right breast, 12:00, 1.5 cm, T1c,N0 invasive mammary carcinoma, triple negative. --> Rx with Chemo  . Cervical stenosis of spine   . Herpes zoster   . IBS (irritable bowel syndrome)   . Lymphedema    has legs wrapped at [facility]  . Multiple sclerosis ([place]) 2001   Walks from room to room @ home; but Wheelchair when going out.  Marland Kitchen Neuromuscular disorder ([place])    MS  . Seizures ([place])    Takes Keppra  . Syncope and collapse     Surgical History: Past Surgical History:  Procedure Laterality Date  . ANKLE SURGERY     Left  . ANKLE SURGERY    . ANTERIOR CERVICAL DECOMP/DISCECTOMY FUSION  11/17/2011   Procedure: ANTERIOR CERVICAL DECOMPRESSION/DISCECTOMY FUSION 2 LEVELS;  Surgeon: Erline Levine, MD;  Location: [facility];  Service: Neurosurgery;  Laterality: N/A;  Cervical Five-Six Six-Seven Anterior cervical decompression/diskectomy/fusion  . BREAST BIOPSY Right 12-31-13   invasive mammary  . BREAST SURGERY Right 02/03/2014   Right simple mastectomy with sentinel node biopsy.  . CHOLECYSTECTOMY    . COLONOSCOPY  2014  . Lower extremity venous Dopplers  Feb 27, 2013   No LE DVT  . MASTECTOMY    . Port a cath insertion Right 01/19/2010  . PORT-A-CATH REMOVAL     right  . PORT-A-CATH REMOVAL Right 09/03/2013   Procedure: REMOVAL PORT-A-CATH;  Surgeon: Conrad Roscommon, MD;  Location: [facility];  Service: Vascular;  Laterality: Right;  . TRANSTHORACIC ECHOCARDIOGRAM  03/2013; 02/2014   a) Normal LV size and function with EF 60-65%.; Cannot exclude bicuspid aortic valve with mild AS and mild AI.; b) Normal EF with normal wall motion and valve function x Mild MR. G2 DD. EF 60-65%. Tricuspid AoV  . UPPER GI ENDOSCOPY  2014    Home Medications:  Allergies as of 04/03/2017      Reactions   Fentanyl Nausea And Vomiting, Nausea Only   vomiting Was given in PACU  x3 each time patient got sick vomiting Was given in PACU x3 each time patient got sick   Sulfa Antibiotics Hives, Other (See Comments)   Light headed, over heated      Medication List        Accurate as of 04/03/17  2:31 PM. Always use your most recent med list.          amantadine 100 MG capsule Commonly known as:  SYMMETREL Take 1 capsule (100 mg total) by mouth 3 (three) times daily.   baclofen 10 MG tablet Commonly known as:  LIORESAL TAKE  1 TO 2 TABLETS BY MOUTH 3 TIMES A DAY   glucosamine-chondroitin 500-400 MG tablet Take 1 tablet by mouth 4 (four) times daily.   ibuprofen 600 MG tablet Commonly known as:  ADVIL,MOTRIN Take 1 tablet (600 mg total) by mouth every 8 (eight) hours as needed.   Interferon Beta-1a 44 MCG/0.5ML Soaj Commonly known as:  REBIF REBIDOSE Inject 0.5 mLs into the skin 3 (three) times a week.   levETIRAcetam 500 MG tablet Commonly known as:  KEPPRA Take 1 tablet (500 mg total) by mouth 2 (two) times daily.   lidocaine 5 % Commonly known as:  LIDODERM Place 1 patch onto the skin every 12 (twelve) hours. Remove & Discard patch within 12 hours or as directed by MD   MULTIVITAMIN PO Take 1 tablet by mouth 2 (two) times daily.   nitrofurantoin (macrocrystal-monohydrate) 100 MG capsule Commonly known as:  MACROBID Take 1 capsule (100 mg total) by mouth every 12 (twelve) hours.   oxybutynin 10 MG 24 hr tablet Commonly known as:  DITROPAN-XL Take 2 tablets (20 mg total) by mouth daily.   PREVAIL WET WIPES Misc Apply 1 each topically daily as needed. (dx neurogenic bladder N31.9, LON 99 months)       Allergies:  Allergies  Allergen Reactions  . Fentanyl Nausea And Vomiting and Nausea Only    vomiting Was given in PACU x3 each time patient got sick vomiting Was given in PACU x3 each time patient got sick  . Sulfa Antibiotics Hives and Other (See Comments)    Light headed, over heated    Family History: Family History  Problem  Relation Age of Onset  . Cancer Father        skin  . Heart disease Father   . Heart attack Father        heart attack in his 67's  . Thyroid disease Sister   . Ovarian cancer Cousin   . Breast cancer Maternal Aunt 60  . Breast cancer Maternal Grandmother 3  . Bladder Cancer Neg Hx   . Kidney cancer Neg Hx     Social History:  reports that  has never smoked. she has never used smokeless tobacco. She reports that she does not drink alcohol or use drugs.  ROS: UROLOGY Frequent Urination?: Yes Hard to postpone urination?: Yes Burning/pain with urination?: No Get up at night to urinate?: Yes Leakage of urine?: Yes Urine stream starts and stops?: Yes Trouble starting stream?: No Do you have to strain to urinate?: No Blood in urine?: No Urinary tract infection?: Yes Sexually transmitted disease?: No Injury to kidneys or bladder?: No Painful intercourse?: No Weak stream?: Yes Currently pregnant?: No Vaginal bleeding?: No Last menstrual period?: n  Gastrointestinal Nausea?: No Vomiting?: No Indigestion/heartburn?: No Diarrhea?: No Constipation?: No  Constitutional Fever: No Night sweats?: No Weight loss?: No Fatigue?: No  Skin Skin rash/lesions?: No Itching?: No  Eyes Blurred vision?: No Double vision?: No  Ears/Nose/Throat Sore throat?: No Sinus problems?: No  Hematologic/Lymphatic Swollen glands?: No Easy bruising?: No  Cardiovascular Leg swelling?: Yes Chest pain?: No  Respiratory Cough?: No Shortness of breath?: No  Endocrine Excessive thirst?: No  Musculoskeletal Back pain?: No Joint pain?: No  Neurological Headaches?: No Dizziness?: No  Psychologic Depression?: No Anxiety?: No  Physical Exam: BP 130/86   Pulse 76   Ht 5\' 4"  (1.626 m)   Wt 170 lb (77.1 kg)   BMI 29.18 kg/m   Constitutional: Well nourished. Alert and oriented, No acute  distress. HEENT: [place] AT, moist mucus membranes. Trachea midline, no  masses. Cardiovascular: No clubbing, cyanosis, or edema. Respiratory: Normal respiratory effort, no increased work of breathing. Skin: No rashes, bruises or suspicious lesions. Lymph: No cervical or inguinal adenopathy. Neurologic: Grossly intact, no focal deficits, moving all 4 extremities. Psychiatric: Normal mood and affect.  Laboratory Data: Lab Results  Component Value Date   WBC 9.5 01/17/2017   HGB 13.0 01/17/2017   HCT 38.6 01/17/2017   MCV 90.2 01/17/2017   PLT 129 (L) 01/17/2017    Lab Results  Component Value Date   CREATININE 0.57 01/17/2017    Lab Results  Component Value Date   AST 21 07/28/2016   Lab Results  Component Value Date   ALT 22 07/28/2016    Urinalysis Negative.  See Epic.   I have reviewed the labs.  Assessment & Plan:    1. History of urinary retention - RTC for CIC instruction on nurses schedule - RTC follow up one month after instruction with log book of PVR's  3. Mixed urinary incontinence - worsened after discontinued the oxybutynin - continue oxybutynin    3. Vaginal atrophy - not a candidate for vaginal estrogen cream due to history of breast cancer - she is at increased risk for UTI's due to the vaginal atrophy  4. History of UTI -Reviewed UTI prevention strategies -Asked patient and her husband to contact our office for symptoms of a UTI so that we can manage   Return for self cath instruction on nurses schedule allow 30 minutes .  These notes generated with voice recognition software. I apologize for typographical errors.  Zara Council, [facility] [address], [place], [place] [address] [phone]

## 2017-04-11 ENCOUNTER — Inpatient Hospital Stay: Payer: Medicare Other

## 2017-04-11 ENCOUNTER — Encounter: Payer: Medicare Other | Admitting: Obstetrics and Gynecology

## 2017-04-12 ENCOUNTER — Inpatient Hospital Stay: Payer: Medicare Other

## 2017-04-14 ENCOUNTER — Encounter: Payer: Medicare Other | Admitting: Physical Medicine & Rehabilitation

## 2017-04-18 ENCOUNTER — Ambulatory Visit (INDEPENDENT_AMBULATORY_CARE_PROVIDER_SITE_OTHER): Payer: Medicare Other | Admitting: Obstetrics and Gynecology

## 2017-04-18 ENCOUNTER — Encounter: Payer: Self-pay | Admitting: Obstetrics and Gynecology

## 2017-04-18 ENCOUNTER — Inpatient Hospital Stay: Payer: Medicare Other | Attending: Oncology | Admitting: *Deleted

## 2017-04-18 VITALS — BP 127/84 | HR 64 | Ht 64.0 in

## 2017-04-18 DIAGNOSIS — G35 Multiple sclerosis: Secondary | ICD-10-CM | POA: Diagnosis not present

## 2017-04-18 DIAGNOSIS — Z452 Encounter for adjustment and management of vascular access device: Secondary | ICD-10-CM | POA: Insufficient documentation

## 2017-04-18 DIAGNOSIS — Z95828 Presence of other vascular implants and grafts: Secondary | ICD-10-CM

## 2017-04-18 DIAGNOSIS — Z853 Personal history of malignant neoplasm of breast: Secondary | ICD-10-CM | POA: Diagnosis not present

## 2017-04-18 DIAGNOSIS — R339 Retention of urine, unspecified: Secondary | ICD-10-CM | POA: Diagnosis not present

## 2017-04-18 MED ORDER — SODIUM CHLORIDE 0.9% FLUSH
10.0000 mL | INTRAVENOUS | Status: AC | PRN
  Administered 2017-04-18: 10 mL
  Filled 2017-04-18: qty 10

## 2017-04-18 MED ORDER — HEPARIN SOD (PORK) LOCK FLUSH 100 UNIT/ML IV SOLN
500.0000 [IU] | INTRAVENOUS | Status: AC | PRN
  Administered 2017-04-18: 500 [IU]

## 2017-04-18 NOTE — Progress Notes (Signed)
HPI:      [name] is a 57 y.o. G3P0020 who LMP was No LMP recorded. Patient is postmenopausal.  Subjective:   She presents desiring to talk about multiple issues.  She has been having trouble with urinary retention and difficulty emptying.  She cannot tell when her bladder is filled and she urinates spontaneously.  This has apparently led to frequent urinary tract infections in the last several months.  She was initially admitted to the hospital and then to rehab but is now back at home.  She continues to experience difficulty with identifying a full bladder.  She is scheduled for a urology appointment tomorrow to learn self-catheterization and consider prophylactic antibiotics. She has MS and on her right upper buttocks complains of a small sore. She reports no vaginal bleeding or other gynecologic issues. She sees Dr. Sanda Klein as her general medical doctor. Her breast cancer has not recurred and she is followed closely by her general surgeon.    Hx: The following portions of the patient's history were reviewed and updated as appropriate:             She  has a past medical history of Aortic valve disease, Bacterial endocarditis, Bilateral lower extremity edema, Breast cancer ([place]) (12-31-13), Cervical stenosis of spine, Herpes zoster, IBS (irritable bowel syndrome), Lymphedema, Multiple sclerosis ([place]) (2001), Neuromuscular disorder ([place]), Seizures ([place]), and Syncope and collapse. She does not have any pertinent problems on file. She  has a past surgical history that includes Cholecystectomy; Ankle surgery; Port-a-cath removal; Anterior cervical decomp/discectomy fusion (11/17/2011); transthoracic echocardiogram (03/2013; 02/2014); Lower extremity venous Dopplers (Feb 27, 2013); Port a cath insertion (Right, 01/19/2010); Colonoscopy (2014); Upper gi endoscopy (2014); Port-a-cath removal (Right, 09/03/2013); Breast biopsy (Right, 12-31-13); Breast surgery (Right, 02/03/2014); Mastectomy; and  Ankle surgery. Her family history includes Breast cancer (age of onset: 67) in her maternal aunt; Breast cancer (age of onset: 37) in her maternal grandmother; Cancer in her father; Heart attack in her father; Heart disease in her father; Ovarian cancer in her cousin; Thyroid disease in her sister. She  reports that  has never smoked. she has never used smokeless tobacco. She reports that she does not drink alcohol or use drugs. She has a current medication list which includes the following prescription(s): amantadine, baclofen, ibuprofen, prevail wet wipes, interferon beta-1a, levetiracetam, multiple vitamins-minerals, oxybutynin, glucosamine-chondroitin, and nitrofurantoin (macrocrystal-monohydrate). She is allergic to fentanyl and sulfa antibiotics.       Review of Systems:  Review of Systems  Constitutional: Denied constitutional symptoms, night sweats, recent illness, fatigue, fever, insomnia and weight loss.  Eyes: Denied eye symptoms, eye pain, photophobia, vision change and visual disturbance.  Ears/Nose/Throat/Neck: Denied ear, nose, throat or neck symptoms, hearing loss, nasal discharge, sinus congestion and sore throat.  Cardiovascular: Denied cardiovascular symptoms, arrhythmia, chest pain/pressure, edema, exercise intolerance, orthopnea and palpitations.  Respiratory: Denied pulmonary symptoms, asthma, pleuritic pain, productive sputum, cough, dyspnea and wheezing.  Gastrointestinal: Denied, gastro-esophageal reflux, melena, nausea and vomiting.  Genitourinary: Denied genitourinary symptoms including symptomatic vaginal discharge, pelvic relaxation issues, and urinary complaints.  Musculoskeletal: Denied musculoskeletal symptoms, stiffness, swelling, muscle weakness and myalgia.  Dermatologic: Denied dermatology symptoms, rash and scar.  Neurologic: Denied neurology symptoms, dizziness, headache, neck pain and syncope.  Psychiatric: Denied psychiatric symptoms, anxiety and depression.   Endocrine: Denied endocrine symptoms including hot flashes and night sweats.   Meds:   Current Outpatient Medications on File Prior to Visit  Medication Sig Dispense Refill  . amantadine (  SYMMETREL) 100 MG capsule Take 1 capsule (100 mg total) by mouth 3 (three) times daily. 270 capsule 4  . baclofen (LIORESAL) 10 MG tablet TAKE 1 TO 2 TABLETS BY MOUTH 3 TIMES A DAY 270 tablet 4  . ibuprofen (ADVIL,MOTRIN) 600 MG tablet Take 1 tablet (600 mg total) by mouth every 8 (eight) hours as needed. 180 tablet 1  . Incontinence Supply Disposable (PREVAIL WET WIPES) MISC Apply 1 each topically daily as needed. (dx neurogenic bladder N31.9, LON 99 months) 96 each 11  . Interferon Beta-1a (REBIF REBIDOSE) 44 MCG/0.5ML SOAJ Inject 0.5 mLs into the skin 3 (three) times a week. 18 mL 4  . levETIRAcetam (KEPPRA) 500 MG tablet Take 1 tablet (500 mg total) by mouth 2 (two) times daily. 180 tablet 2  . Multiple Vitamins-Minerals (MULTIVITAMIN PO) Take 1 tablet by mouth 2 (two) times daily.     Marland Kitchen oxybutynin (DITROPAN-XL) 10 MG 24 hr tablet Take 2 tablets (20 mg total) by mouth daily. 180 tablet 3  . glucosamine-chondroitin 500-400 MG tablet Take 1 tablet by mouth 4 (four) times daily.    . nitrofurantoin, macrocrystal-monohydrate, (MACROBID) 100 MG capsule Take 1 capsule (100 mg total) by mouth every 12 (twelve) hours. (Patient not taking: Reported on 04/18/2017) 14 capsule 0   No current facility-administered medications on file prior to visit.     Objective:     Vitals:   04/18/17 1433  BP: 127/84  Pulse: 64                Assessment:    G3P0020 Patient Active Problem List   Diagnosis Date Noted  . Bilateral lower extremity pain (primary) (bilateral) (right greater than left) 08/01/2016  . Chronic neck pain (secondary) (bilateral) ( left greater than right) 07/28/2016  . Long term current use of opiate analgesic 07/28/2016  . Long term prescription opiate use 07/28/2016  . Opiate use 07/28/2016   . Neutropenia ([place]) 06/27/2016  . Chronic pain syndrome 06/03/2016  . Adjustment disorder with mixed anxiety and depressed mood   . Neurogenic bladder   . Neurogenic bowel   . Detrusor and sphincter dyssynergia   . Urinary incontinence   . Seizure disorder ([place])   . Slow transit constipation   . 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 Lymphedema   . Hypoalbuminemia due to protein-calorie malnutrition ([place])   . Acute blood loss anemia   . Encounter for screening for cervical cancer  11/27/2015  . Preventative health care 11/25/2015  . History of breast cancer in female 10/28/2015  . Anemia 10/28/2015  . Thrombocytopenia ([place]) 10/28/2015  . Allergic rhinitis 10/28/2015  . IBS (irritable bowel syndrome) 10/28/2015  . Encounter for eye exam 08/25/2015  . 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 History of right mastectomy 10/24/2014  . Need for immunization against influenza 10/24/2014  . Medicare annual wellness visit, subsequent 10/24/2014  . MS (multiple sclerosis) ([place]) 09/16/2014  . Hematoma complicating a procedure 03/06/2014  . Primary cancer of right female breast ([place]) 01/10/2014  . SOB (shortness of breath) 03/01/2013  . Bilateral lower extremity edema   . 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 Urinary retention   2. Multiple sclerosis ([place])       Plan:            1.  We have discussed her issues with Urinary retention and frequent urinary tract infections in great detail.  I have strongly recommended she keep her appointment with urology tomorrow  and discuss long-term future follow-up.  2.  Patient has had normal Pap smears with negative HPV.  Recommend Pap smear approximately every 3 years.  3.  Continued follow-up with general surgery for breast cancer. Orders No orders of the defined types were placed in this encounter.   No orders of the defined types were placed in this encounter.     F/U  No Follow-up on file. I spent 18 minutes with this patient of  which greater than 50% was spent discussing her multiple medical issues with special emphasis on her urinary retention and frequent urinary tract infections.  Recommended annual follow-up in general medical care discussed in detail.  Finis Bud, M.D. 04/18/2017 3:51 PM

## 2017-04-19 ENCOUNTER — Ambulatory Visit: Payer: Medicare Other

## 2017-04-21 ENCOUNTER — Ambulatory Visit: Payer: Medicare Other

## 2017-04-26 ENCOUNTER — Other Ambulatory Visit: Payer: Self-pay | Admitting: *Deleted

## 2017-04-26 MED ORDER — INTERFERON BETA-1A 44 MCG/0.5ML ~~LOC~~ SOAJ: 0.5000 mL | SUBCUTANEOUS | 11 refills | Status: DC

## 2017-04-26 NOTE — Telephone Encounter (Signed)
I called and spoke to [place] (which was [facility]).  Last fill of rebif was 04-19-17.  Needs new prescription.  Done.

## 2017-04-27 ENCOUNTER — Ambulatory Visit: Payer: Medicare Other

## 2017-04-27 ENCOUNTER — Ambulatory Visit: Payer: Medicare Other | Admitting: Family Medicine

## 2017-04-28 ENCOUNTER — Encounter: Payer: Medicare Other | Admitting: Physical Medicine & Rehabilitation

## 2017-05-01 ENCOUNTER — Ambulatory Visit: Payer: Medicare Other | Admitting: Family Medicine

## 2017-05-01 ENCOUNTER — Ambulatory Visit: Payer: Medicare Other

## 2017-05-03 ENCOUNTER — Ambulatory Visit: Payer: Medicare Other

## 2017-05-04 ENCOUNTER — Encounter: Payer: Medicare Other | Admitting: Physical Medicine & Rehabilitation

## 2017-05-24 ENCOUNTER — Inpatient Hospital Stay: Payer: Medicare Other

## 2017-05-25 ENCOUNTER — Encounter: Payer: Medicare Other | Admitting: Physical Medicine & Rehabilitation

## 2017-05-30 ENCOUNTER — Inpatient Hospital Stay: Payer: Medicare Other

## 2017-05-31 ENCOUNTER — Inpatient Hospital Stay: Payer: Medicare Other | Attending: Oncology

## 2017-05-31 DIAGNOSIS — Z853 Personal history of malignant neoplasm of breast: Secondary | ICD-10-CM | POA: Insufficient documentation

## 2017-05-31 DIAGNOSIS — Z452 Encounter for adjustment and management of vascular access device: Secondary | ICD-10-CM | POA: Diagnosis not present

## 2017-05-31 DIAGNOSIS — Z95828 Presence of other vascular implants and grafts: Secondary | ICD-10-CM

## 2017-05-31 MED ORDER — HEPARIN SOD (PORK) LOCK FLUSH 100 UNIT/ML IV SOLN
500.0000 [IU] | INTRAVENOUS | Status: AC | PRN
  Administered 2017-05-31: 500 [IU]

## 2017-05-31 MED ORDER — SODIUM CHLORIDE 0.9% FLUSH
10.0000 mL | INTRAVENOUS | Status: AC | PRN
  Administered 2017-05-31: 10 mL
  Filled 2017-05-31: qty 10

## 2017-06-01 ENCOUNTER — Telehealth: Payer: Self-pay | Admitting: Family Medicine

## 2017-06-01 DIAGNOSIS — G35 Multiple sclerosis: Secondary | ICD-10-CM

## 2017-06-01 DIAGNOSIS — G40909 Epilepsy, unspecified, not intractable, without status epilepticus: Secondary | ICD-10-CM

## 2017-06-01 NOTE — Telephone Encounter (Signed)
Yes, that's fine; thank you She will need an appointment if for home health, though; we need an appointment either 90 days prior or 30 days after, so please schedule her to see me within 30 days of the order

## 2017-06-01 NOTE — Telephone Encounter (Signed)
Ok to proceed with referral? Please advise

## 2017-06-01 NOTE — Telephone Encounter (Signed)
Copied from [place]. Topic: Quick Communication - See Telephone Encounter >> Jun 01, 2017 11:52 AM Bea Graff, NT wrote: CRM for notification. See Telephone encounter for: 06/01/17. Pt is wanting an order put in for home health. Someone to help her clean and to bathe herself. Requesting [facility].

## 2017-06-02 ENCOUNTER — Ambulatory Visit: Payer: Medicare Other | Admitting: Family Medicine

## 2017-06-02 ENCOUNTER — Telehealth: Payer: Self-pay

## 2017-06-02 NOTE — Telephone Encounter (Signed)
Copied from [place]. Topic: Quick Communication - Office Called Patient >> Jun 02, 2017  2:57 PM Yvette Rack wrote: Reason for CRM: pt states that someone called and she was on the other line and couldn't get to the call    Called pt informed her of the need to be seen with 30 days of home health referral, pt already has appt scheduled.

## 2017-06-02 NOTE — Telephone Encounter (Signed)
Called pt no answer. Unable to leave message as no voicemail is set up.  

## 2017-06-02 NOTE — Telephone Encounter (Signed)
Appointment was scheduled for 07/07/17, so I'll have to put the referral in after 06/07/17

## 2017-06-09 ENCOUNTER — Ambulatory Visit: Payer: Medicare Other | Admitting: Physical Medicine & Rehabilitation

## 2017-06-12 NOTE — Telephone Encounter (Signed)
Orders placed.

## 2017-06-14 ENCOUNTER — Encounter: Payer: Medicare Other | Admitting: Physical Medicine & Rehabilitation

## 2017-06-16 ENCOUNTER — Encounter

## 2017-06-16 ENCOUNTER — Ambulatory Visit: Payer: Medicare Other | Admitting: Family Medicine

## 2017-06-19 ENCOUNTER — Ambulatory Visit: Payer: Self-pay | Admitting: Family Medicine

## 2017-06-21 ENCOUNTER — Telehealth: Payer: Self-pay | Admitting: Family Medicine

## 2017-06-21 DIAGNOSIS — M4802 Spinal stenosis, cervical region: Secondary | ICD-10-CM | POA: Diagnosis not present

## 2017-06-21 DIAGNOSIS — D649 Anemia, unspecified: Secondary | ICD-10-CM | POA: Diagnosis not present

## 2017-06-21 DIAGNOSIS — G894 Chronic pain syndrome: Secondary | ICD-10-CM | POA: Diagnosis not present

## 2017-06-21 DIAGNOSIS — F4323 Adjustment disorder with mixed anxiety and depressed mood: Secondary | ICD-10-CM | POA: Diagnosis not present

## 2017-06-21 DIAGNOSIS — G35 Multiple sclerosis: Secondary | ICD-10-CM | POA: Diagnosis not present

## 2017-06-21 DIAGNOSIS — K589 Irritable bowel syndrome without diarrhea: Secondary | ICD-10-CM | POA: Diagnosis not present

## 2017-06-21 DIAGNOSIS — G40209 Localization-related (focal) (partial) symptomatic epilepsy and epileptic syndromes with complex partial seizures, not intractable, without status epilepticus: Secondary | ICD-10-CM | POA: Diagnosis not present

## 2017-06-21 DIAGNOSIS — N319 Neuromuscular dysfunction of bladder, unspecified: Secondary | ICD-10-CM | POA: Diagnosis not present

## 2017-06-21 DIAGNOSIS — Z853 Personal history of malignant neoplasm of breast: Secondary | ICD-10-CM | POA: Diagnosis not present

## 2017-06-21 DIAGNOSIS — G801 Spastic diplegic cerebral palsy: Secondary | ICD-10-CM | POA: Diagnosis not present

## 2017-06-21 DIAGNOSIS — K529 Noninfective gastroenteritis and colitis, unspecified: Secondary | ICD-10-CM | POA: Diagnosis not present

## 2017-06-21 DIAGNOSIS — R339 Retention of urine, unspecified: Secondary | ICD-10-CM | POA: Diagnosis not present

## 2017-06-21 DIAGNOSIS — K5900 Constipation, unspecified: Secondary | ICD-10-CM | POA: Diagnosis not present

## 2017-06-21 DIAGNOSIS — Z981 Arthrodesis status: Secondary | ICD-10-CM | POA: Diagnosis not present

## 2017-06-21 DIAGNOSIS — Z9181 History of falling: Secondary | ICD-10-CM | POA: Diagnosis not present

## 2017-06-21 NOTE — Telephone Encounter (Signed)
Copied from [place] #[id number]. Topic: General - Other >> Jun 21, 2017  1:10 PM Margot Ables wrote: Reason for CRM: requesting VO for PT 2x week for 5 weeks. SN 2x week 2 weeks, 1x week for 3 weeks. OT evaluation visit. Medical SW visit for community resources. [place] aide visit for grooming and bathing 3x week 1 week, 2x week 4 weeks. Ok to leave detailed message on his secure voicemail.

## 2017-06-21 NOTE — Telephone Encounter (Signed)
That is great; yes, thank you; okay for orders

## 2017-06-22 ENCOUNTER — Encounter: Payer: Medicare Other | Admitting: Physical Medicine & Rehabilitation

## 2017-06-22 ENCOUNTER — Encounter: Payer: Medicare Other | Attending: Physical Medicine & Rehabilitation | Admitting: Physical Medicine & Rehabilitation

## 2017-06-22 ENCOUNTER — Other Ambulatory Visit: Payer: Self-pay

## 2017-06-22 ENCOUNTER — Encounter: Payer: Self-pay | Admitting: Physical Medicine & Rehabilitation

## 2017-06-22 VITALS — BP 150/84 | HR 62 | Ht 64.0 in

## 2017-06-22 DIAGNOSIS — Z853 Personal history of malignant neoplasm of breast: Secondary | ICD-10-CM | POA: Diagnosis not present

## 2017-06-22 DIAGNOSIS — Z8249 Family history of ischemic heart disease and other diseases of the circulatory system: Secondary | ICD-10-CM | POA: Insufficient documentation

## 2017-06-22 DIAGNOSIS — Z9011 Acquired absence of right breast and nipple: Secondary | ICD-10-CM | POA: Diagnosis not present

## 2017-06-22 DIAGNOSIS — Z981 Arthrodesis status: Secondary | ICD-10-CM | POA: Insufficient documentation

## 2017-06-22 DIAGNOSIS — I890 Lymphedema, not elsewhere classified: Secondary | ICD-10-CM | POA: Diagnosis not present

## 2017-06-22 DIAGNOSIS — Z9221 Personal history of antineoplastic chemotherapy: Secondary | ICD-10-CM | POA: Insufficient documentation

## 2017-06-22 DIAGNOSIS — G35 Multiple sclerosis: Secondary | ICD-10-CM

## 2017-06-22 DIAGNOSIS — G8929 Other chronic pain: Secondary | ICD-10-CM | POA: Diagnosis not present

## 2017-06-22 DIAGNOSIS — R269 Unspecified abnormalities of gait and mobility: Secondary | ICD-10-CM | POA: Diagnosis not present

## 2017-06-22 DIAGNOSIS — Z79899 Other long term (current) drug therapy: Secondary | ICD-10-CM | POA: Diagnosis not present

## 2017-06-22 DIAGNOSIS — G40909 Epilepsy, unspecified, not intractable, without status epilepticus: Secondary | ICD-10-CM | POA: Diagnosis not present

## 2017-06-22 DIAGNOSIS — Z8349 Family history of other endocrine, nutritional and metabolic diseases: Secondary | ICD-10-CM | POA: Diagnosis not present

## 2017-06-22 DIAGNOSIS — G801 Spastic diplegic cerebral palsy: Secondary | ICD-10-CM | POA: Diagnosis not present

## 2017-06-22 MED ORDER — TIZANIDINE HCL 4 MG PO TABS: 2.0000 mg | ORAL_TABLET | Freq: Two times a day (BID) | ORAL | 1 refills | Status: DC | PRN

## 2017-06-22 NOTE — Telephone Encounter (Signed)
Left verbal order on voicemial

## 2017-06-22 NOTE — Progress Notes (Signed)
Subjective:    Patient ID: Rebecca [name], female    DOB: 1960-07-10, 57 y.o.   MRN: [id number]  HPI 57 year old right-handed female, history of chronic pain, lymphedema bilateral lower extremities, right breast cancer, chemotherapy, seizure disorder, multiple sclerosis presents for follow up for MS.  Last clinic visit 12/15/16.  Husband present, who supplements history.  She continues to follow up with Neurology.  She is having tightness in her LE.  She is having pain in her LE, in part due to tightness. 1 fall due to loss of balance.   Pain Inventory Average Pain 6 Pain Right Now 0 My pain is intermittent, tingling and aching  In the last 24 hours, has pain interfered with the following? General activity 0 Relation with others 0 Enjoyment of life 0 What TIME of day is your pain at its worst? night, night Sleep (in general) Good  Pain is worse with: sitting and inactivity Pain improves with: medication Relief from Meds: 10  Mobility walk with assistance use a walker how many minutes can you walk? 5 ability to climb steps?  no do you drive?  no Do you have any goals in this area?  yes  Function not employed: date last employed . disabled: date disabled . I need assistance with the following:  household duties and shopping Do you have any goals in this area?  no  Neuro/Psych bladder control problems weakness numbness tingling trouble walking spasms dizziness  Prior Studies hospital f/u  Physicians involved in your care hospital f/u   Family History  Problem Relation Age of Onset  . Cancer Father        skin  . Heart disease Father   . Heart attack Father        heart attack in his 80's  . Thyroid disease Sister   . Ovarian cancer Cousin   . Breast cancer Maternal Aunt 60  . Breast cancer Maternal Grandmother 59  . Bladder Cancer Neg Hx   . Kidney cancer Neg Hx    Social History   Socioeconomic History  . Marital status: Married    Spouse name:  don  . Number of children: 0  . Years of education: 24  . Highest education level: Not on file  Occupational History  . Occupation: disability  Social Needs  . Financial resource strain: Not on file  . Food insecurity:    Worry: Not on file    Inability: Not on file  . Transportation needs:    Medical: Not on file    Non-medical: Not on file  Tobacco Use  . Smoking status: Never Smoker  . Smokeless tobacco: Never Used  Substance and Sexual Activity  . Alcohol use: No  . Drug use: No  . Sexual activity: Not Currently    Partners: Male  Lifestyle  . Physical activity:    Days per week: Not on file    Minutes per session: Not on file  . Stress: Not on file  Relationships  . Social connections:    Talks on phone: Not on file    Gets together: Not on file    Attends religious service: Not on file    Active member of club or organization: Not on file    Attends meetings of clubs or organizations: Not on file    Relationship status: Not on file  Other Topics Concern  . Not on file  Social History Narrative   She is married. Recently moved back to [place] after being in [place] for some time. She is accompanied by her husband and aunt.   Never smoked. Never used alcohol.   Past Surgical History:  Procedure Laterality Date  . ANKLE SURGERY     Left  . ANKLE SURGERY    . ANTERIOR CERVICAL DECOMP/DISCECTOMY FUSION  11/17/2011   Procedure: ANTERIOR CERVICAL DECOMPRESSION/DISCECTOMY FUSION 2 LEVELS;  Surgeon: Erline Levine, MD;  Location: [facility];  Service: Neurosurgery;  Laterality: N/A;  Cervical Five-Six Six-Seven Anterior cervical decompression/diskectomy/fusion  . BREAST BIOPSY Right 12-31-13   invasive mammary  . BREAST SURGERY Right 02/03/2014   Right simple mastectomy with sentinel node biopsy.  . CHOLECYSTECTOMY    . COLONOSCOPY  2014  . Lower extremity venous Dopplers  Feb 27, 2013   No LE DVT  . MASTECTOMY    . Port a cath insertion Right  01/19/2010  . PORT-A-CATH REMOVAL     right  . PORT-A-CATH REMOVAL Right 09/03/2013   Procedure: REMOVAL PORT-A-CATH;  Surgeon: Conrad , MD;  Location: [facility];  Service: Vascular;  Laterality: Right;  . TRANSTHORACIC ECHOCARDIOGRAM  03/2013; 02/2014   a) Normal LV size and function with EF 60-65%.; Cannot exclude bicuspid aortic valve with mild AS and mild AI.; b) Normal EF with normal wall motion and valve function x Mild MR. G2 DD. EF 60-65%. Tricuspid AoV  . UPPER GI ENDOSCOPY  2014   Past Medical History:  Diagnosis Date  . Aortic valve disease    Mild AS / AI - most recent Echo demonstrated tricuspid aortic valve.  . Bacterial endocarditis    History of .  Marland Kitchen Bilateral lower extremity edema    Noncardiac.  Chronic. LE Venous dopplers - negative for DVT.; Echocardiogram January 2016: Normal EF with normal wall motion and valve function. Only grade 1 diastolic dysfunction. EF 60-65%. Mild MR  . Breast cancer ([place]) 12-31-13   Right breast, 12:00, 1.5 cm, T1c,N0 invasive mammary carcinoma, triple negative. --> Rx with Chemo  . Cervical stenosis of spine   . Herpes zoster   . IBS (irritable bowel syndrome)   . Lymphedema    [facility] legs [facility]  . Multiple sclerosis ([place]) 2001   Walks from room to room @ home; but Wheelchair when going out.  Marland Kitchen Neuromuscular disorder ([place])    MS  . Seizures ([place])    Takes Keppra  . Syncope and collapse    BP (!) 150/84   Pulse 62   Ht 5\' 4"  (1.626 m)   SpO2 98%   BMI 29.18 kg/m   Opioid Risk Score:   Fall Risk Score:  `1  Depression screen PHQ 2/9  Depression screen [facility] 2/9 02/09/2017 10/28/2016 07/28/2016 07/15/2016 06/27/2016 11/25/2015 08/25/2015  Decreased Interest 0 - 0 2 0 0 0  Down, Depressed, Hopeless 0 0 0 0 0 0 0  PHQ - 2 Score 0 0 0 2 0 0 0  Altered sleeping - - - 0 - - -  Tired, decreased energy - - - 0 - - -  Change in appetite - - - 0 - - -  Feeling bad or failure about yourself  - - - 0 - - -  Trouble concentrating - - -  0 - - -  Moving slowly or fidgety/restless - - - 0 - - -  Suicidal thoughts - - - 0 - - -  PHQ-9 Score - - - 2 - - -  Difficult doing work/chores - - -  Somewhat difficult - - -  Some recent data might be hidden    Review of Systems  HENT: Negative.   Eyes: Negative.   Respiratory: Negative.   Cardiovascular: Negative.   Gastrointestinal: Negative.   Endocrine: Negative.   Genitourinary: Positive for difficulty urinating.  Musculoskeletal: Positive for arthralgias and gait problem.       Spasms  Allergic/Immunologic: Negative.   Neurological: Positive for dizziness, weakness and numbness.       Tingling  Hematological: Negative.   Psychiatric/Behavioral: Negative.   All other systems reviewed and are negative.      Objective:   Physical Exam Gen: Well developed. NAD. Vital signs reviewed. Head: Normocephalic. Atraumatic. Eyes: EOMI. No discharge Cardiovascular: RRR. No JVD.  Respiratory: CTA B. Unlabored. GI: Soft. Bowel sounds are normal.  Musc: Edema b/l LE.  Right heel cord contracture Neurological. Alert and oriented  Fair awareness of deficits.  Motor: RUE: 4+/5 proximal to distal  LUE: 4+/5 proximal to distal RLE: 2+/5 HF, 3-/5 KE, 2-/5 ADF/PF  LLE: 3/5 HF, 3+/5 KE, 4-/5 ADF/PF MAS: LLE hip extensors 1/4 RLE: hip extensors 2/4, knee extensors 1+/4, ankle dorsiflexors 2/4 within available ROM  Skin. Chronic lymphedema lower extremities  Psych: pleasant and cooperative    Assessment & Plan:  57 year old right-handed female, history of chronic pain, lymphedema bilateral lower extremities, right breast cancer, chemotherapy, seizure disorder, multiple sclerosis presents for follow up for MS.   1. MS   Therapies to start next work  Cont meds   Cont follow up with Neurology  2.  Spastic diplegia:   Cont ROM  Cont Baclofen  Will add trial tizanidine 2mg  BID PRN  Will consider tiral Botox injection in the future after oral therapy  3. Chronic  lymphedema  Cont wraps  States wound care will only address if open wounds present  Encouraged cont pumps and elevation  4. Gait abnormality  Begin therapies  Cont walker/wheelchair for safety

## 2017-06-23 ENCOUNTER — Ambulatory Visit (INDEPENDENT_AMBULATORY_CARE_PROVIDER_SITE_OTHER): Payer: Medicare Other | Admitting: Family Medicine

## 2017-06-23 ENCOUNTER — Encounter: Payer: Self-pay | Admitting: Family Medicine

## 2017-06-23 VITALS — BP 126/84 | HR 62 | Temp 97.9°F | Resp 14

## 2017-06-23 DIAGNOSIS — N319 Neuromuscular dysfunction of bladder, unspecified: Secondary | ICD-10-CM

## 2017-06-23 DIAGNOSIS — G801 Spastic diplegic cerebral palsy: Secondary | ICD-10-CM

## 2017-06-23 DIAGNOSIS — Z9011 Acquired absence of right breast and nipple: Secondary | ICD-10-CM

## 2017-06-23 DIAGNOSIS — G35 Multiple sclerosis: Secondary | ICD-10-CM

## 2017-06-23 DIAGNOSIS — I890 Lymphedema, not elsewhere classified: Secondary | ICD-10-CM | POA: Diagnosis not present

## 2017-06-23 DIAGNOSIS — R600 Localized edema: Secondary | ICD-10-CM

## 2017-06-23 NOTE — Assessment & Plan Note (Signed)
May get botox injection per neurologist

## 2017-06-23 NOTE — Assessment & Plan Note (Signed)
Lower extremity weakness; in wheelchair; seeing neurologist

## 2017-06-23 NOTE — Assessment & Plan Note (Signed)
Normal LVEF in 2016; lymphedema

## 2017-06-23 NOTE — Assessment & Plan Note (Signed)
Result of the MS; seeing urologist; doing in and out caths to release residual urine; on demand, not timed

## 2017-06-23 NOTE — Assessment & Plan Note (Addendum)
>>  ASSESSMENT AND PLAN FOR LYMPHEDEMA WRITTEN ON 06/23/2017  3:44 PM BY Trevonte Ashkar P, MD  Right leg worse than left, chronic; helped by lymphedema machine and home health assistance is greatly appreciated; .lymphedema therapy should be helpful for this  >>ASSESSMENT AND PLAN FOR BILATERAL LOWER EXTREMITY EDEMA WRITTEN ON 06/23/2017  3:46 PM BY Zavannah Deblois P, MD  Normal LVEF in 2016; lymphedema

## 2017-06-23 NOTE — Patient Instructions (Signed)
Call me with any new needs

## 2017-06-23 NOTE — Progress Notes (Signed)
BP 126/84   Pulse 62   Temp 97.9 F (36.6 C) (Oral)   Resp 14   SpO2 98%    Subjective:    Patient ID: Rebecca [name], female    DOB: 03-04-1960, 57 y.o.   MRN: [id number]  HPI: Rebecca [name] is a 57 y.o. female  Chief Complaint  Patient presents with  . Follow-up    HPI Patient is here for face to face evaluation Leg edema, bilateral; lymphadema Going on for a long time They used to wrap them when there were open wounds; nothing major Nothing is oozing now; no open wounds Has a lymphedema machine at home; it is helpful once she gets it on Has to go to the bathroom and can really tell it's taking the fluid off  MS; seeing Dr. Tish Frederickson in [place]; MS has been stable overall; in a wheelchair; weakness is in the legs; has fallen 1-3 times in the last month or two; husband has had to help, and has had to call EMS; she cannot bend her legs and she is dead weight, not able to bend knees; cannot pull under that right arm where mastectomy was; having muscle spasms in legs; trying one pill, but may try a second pill, and if not effective, may go to botox; seizure d/o; has not had one in a while on Keppra; tried coming off and got really dizzy, so staying on that; a few years without a seizure  Hx of breast cancer, mastectomy; unchanged  Depression screen [facility] 2/9 06/23/2017 02/09/2017 10/28/2016 07/28/2016 07/15/2016  Decreased Interest 0 0 - 0 2  Down, Depressed, Hopeless 0 0 0 0 0  PHQ - 2 Score 0 0 0 0 2  Altered sleeping - - - - 0  Tired, decreased energy - - - - 0  Change in appetite - - - - 0  Feeling bad or failure about yourself  - - - - 0  Trouble concentrating - - - - 0  Moving slowly or fidgety/restless - - - - 0  Suicidal thoughts - - - - 0  PHQ-9 Score - - - - 2  Difficult doing work/chores - - - - Somewhat difficult  Some recent data might be hidden    Relevant past medical, surgical, family and social history reviewed Past Medical History:  Diagnosis Date  .  Aortic valve disease    Mild AS / AI - most recent Echo demonstrated tricuspid aortic valve.  . Bacterial endocarditis    History of .  Marland Kitchen Bilateral lower extremity edema    Noncardiac.  Chronic. LE Venous dopplers - negative for DVT.; Echocardiogram January 2016: Normal EF with normal wall motion and valve function. Only grade 1 diastolic dysfunction. EF 60-65%. Mild MR  . Breast cancer ([place]) 12-31-13   Right breast, 12:00, 1.5 cm, T1c,N0 invasive mammary carcinoma, triple negative. --> Rx with Chemo  . Cervical stenosis of spine   . Herpes zoster   . IBS (irritable bowel syndrome)   . Lymphedema    has legs wrapped at [facility]  . Multiple sclerosis ([place]) 2001   Walks from room to room @ home; but Wheelchair when going out.  Marland Kitchen Neuromuscular disorder ([place])    MS  . Seizures ([place])    Takes Keppra  . Syncope and collapse    Past Surgical History:  Procedure Laterality Date  . ANKLE SURGERY     Left  . ANKLE SURGERY    . ANTERIOR CERVICAL  DECOMP/DISCECTOMY FUSION  11/17/2011   Procedure: ANTERIOR CERVICAL DECOMPRESSION/DISCECTOMY FUSION 2 LEVELS;  Surgeon: Erline Levine, MD;  Location: [facility];  Service: Neurosurgery;  Laterality: N/A;  Cervical Five-Six Six-Seven Anterior cervical decompression/diskectomy/fusion  . BREAST BIOPSY Right 12-31-13   invasive mammary  . BREAST SURGERY Right 02/03/2014   Right simple mastectomy with sentinel node biopsy.  . CHOLECYSTECTOMY    . COLONOSCOPY  2014  . Lower extremity venous Dopplers  Feb 27, 2013   No LE DVT  . MASTECTOMY    . Port a cath insertion Right 01/19/2010  . PORT-A-CATH REMOVAL     right  . PORT-A-CATH REMOVAL Right 09/03/2013   Procedure: REMOVAL PORT-A-CATH;  Surgeon: Conrad Raywick, MD;  Location: [facility];  Service: Vascular;  Laterality: Right;  . TRANSTHORACIC ECHOCARDIOGRAM  03/2013; 02/2014   a) Normal LV size and function with EF 60-65%.; Cannot exclude bicuspid aortic valve with mild AS and mild AI.; b) Normal EF with  normal wall motion and valve function x Mild MR. G2 DD. EF 60-65%. Tricuspid AoV  . UPPER GI ENDOSCOPY  2014   Family History  Problem Relation Age of Onset  . Cancer Father        skin  . Heart disease Father   . Heart attack Father        heart attack in his 54's  . Thyroid disease Sister   . Ovarian cancer Cousin   . Breast cancer Maternal Aunt 60  . Breast cancer Maternal Grandmother 56  . Bladder Cancer Neg Hx   . Kidney cancer Neg Hx    Social History   Tobacco Use  . Smoking status: Never Smoker  . Smokeless tobacco: Never Used  Substance Use Topics  . Alcohol use: No  . Drug use: No    Interim medical history since last visit reviewed. Allergies and medications reviewed  Review of Systems Per HPI unless specifically indicated above     Objective:    BP 126/84   Pulse 62   Temp 97.9 F (36.6 C) (Oral)   Resp 14   SpO2 98%   Wt Readings from Last 3 Encounters:  04/03/17 170 lb (77.1 kg)  02/16/17 170 lb (77.1 kg)  02/14/17 170 lb (77.1 kg)    Physical Exam  Constitutional: She appears well-developed and well-nourished.  Middle-aged female, nseated in wheelchair; appears deconditioned but nontoxic  HENT:  Mouth/Throat: Mucous membranes are normal.  Eyes: EOM are normal. No scleral icterus.  Cardiovascular: Normal rate and regular rhythm.  Pulmonary/Chest: Effort normal and breath sounds normal.  Musculoskeletal: She exhibits edema (bilateral leg edema; wrapped).  Psychiatric: She has a normal mood and affect. Her behavior is normal.      Assessment & Plan:   Problem List Items Addressed This Visit      Nervous and Auditory   Spastic diplegia [facility])    May get botox injection per neurologist      MS (multiple sclerosis) ([place]) - Primary    Lower extremity weakness; in wheelchair; seeing neurologist        Other   Neurogenic bladder    Result of the MS; seeing urologist; doing in and out caths to release residual urine; on demand, not  timed      Lymphedema    Right leg worse than left, chronic; helped by lymphedema machine and home health assistance is greatly appreciated; .lymphedema therapy should be helpful for this      History of right  mastectomy    stable      Bilateral lower extremity edema (Chronic)    Normal LVEF in 2016; lymphedema          Follow up plan: Return in about 6 months (around 12/24/2017) for follow-up visit with Dr. Sanda Klein.  An after-visit summary was printed and given to the patient at [facility].  Please see the patient instructions which may contain other information and recommendations beyond what is mentioned above in the assessment and plan.  No orders of the defined types were placed in this encounter.   No orders of the defined types were placed in this encounter.

## 2017-06-23 NOTE — Assessment & Plan Note (Signed)
stable °

## 2017-06-26 DIAGNOSIS — M4802 Spinal stenosis, cervical region: Secondary | ICD-10-CM | POA: Diagnosis not present

## 2017-06-26 DIAGNOSIS — K589 Irritable bowel syndrome without diarrhea: Secondary | ICD-10-CM | POA: Diagnosis not present

## 2017-06-26 DIAGNOSIS — K529 Noninfective gastroenteritis and colitis, unspecified: Secondary | ICD-10-CM | POA: Diagnosis not present

## 2017-06-26 DIAGNOSIS — G40209 Localization-related (focal) (partial) symptomatic epilepsy and epileptic syndromes with complex partial seizures, not intractable, without status epilepticus: Secondary | ICD-10-CM | POA: Diagnosis not present

## 2017-06-26 DIAGNOSIS — G801 Spastic diplegic cerebral palsy: Secondary | ICD-10-CM | POA: Diagnosis not present

## 2017-06-26 DIAGNOSIS — G35 Multiple sclerosis: Secondary | ICD-10-CM | POA: Diagnosis not present

## 2017-06-27 ENCOUNTER — Telehealth: Payer: Self-pay | Admitting: Family Medicine

## 2017-06-27 DIAGNOSIS — K529 Noninfective gastroenteritis and colitis, unspecified: Secondary | ICD-10-CM | POA: Diagnosis not present

## 2017-06-27 DIAGNOSIS — G40209 Localization-related (focal) (partial) symptomatic epilepsy and epileptic syndromes with complex partial seizures, not intractable, without status epilepticus: Secondary | ICD-10-CM | POA: Diagnosis not present

## 2017-06-27 DIAGNOSIS — K589 Irritable bowel syndrome without diarrhea: Secondary | ICD-10-CM | POA: Diagnosis not present

## 2017-06-27 DIAGNOSIS — G801 Spastic diplegic cerebral palsy: Secondary | ICD-10-CM | POA: Diagnosis not present

## 2017-06-27 DIAGNOSIS — G35 Multiple sclerosis: Secondary | ICD-10-CM | POA: Diagnosis not present

## 2017-06-27 DIAGNOSIS — M4802 Spinal stenosis, cervical region: Secondary | ICD-10-CM | POA: Diagnosis not present

## 2017-06-27 NOTE — Telephone Encounter (Signed)
That is fine thank you .  

## 2017-06-27 NOTE — Telephone Encounter (Signed)
Copied from [place] [phone]. Topic: General - Other >> Jun 27, 2017  1:52 PM Valla Leaver wrote: Reason for CRM: Colletta Maryland, OT, with [facility]  calling for OT orders with frequency 2x a wk for 4wks.

## 2017-06-28 DIAGNOSIS — G40209 Localization-related (focal) (partial) symptomatic epilepsy and epileptic syndromes with complex partial seizures, not intractable, without status epilepticus: Secondary | ICD-10-CM | POA: Diagnosis not present

## 2017-06-28 DIAGNOSIS — K529 Noninfective gastroenteritis and colitis, unspecified: Secondary | ICD-10-CM | POA: Diagnosis not present

## 2017-06-28 DIAGNOSIS — M4802 Spinal stenosis, cervical region: Secondary | ICD-10-CM | POA: Diagnosis not present

## 2017-06-28 DIAGNOSIS — G35 Multiple sclerosis: Secondary | ICD-10-CM | POA: Diagnosis not present

## 2017-06-28 DIAGNOSIS — G801 Spastic diplegic cerebral palsy: Secondary | ICD-10-CM | POA: Diagnosis not present

## 2017-06-28 DIAGNOSIS — K589 Irritable bowel syndrome without diarrhea: Secondary | ICD-10-CM | POA: Diagnosis not present

## 2017-06-28 NOTE — Telephone Encounter (Signed)
Verbal given 

## 2017-06-29 DIAGNOSIS — K529 Noninfective gastroenteritis and colitis, unspecified: Secondary | ICD-10-CM | POA: Diagnosis not present

## 2017-06-29 DIAGNOSIS — K589 Irritable bowel syndrome without diarrhea: Secondary | ICD-10-CM | POA: Diagnosis not present

## 2017-06-29 DIAGNOSIS — M4802 Spinal stenosis, cervical region: Secondary | ICD-10-CM | POA: Diagnosis not present

## 2017-06-29 DIAGNOSIS — G801 Spastic diplegic cerebral palsy: Secondary | ICD-10-CM | POA: Diagnosis not present

## 2017-06-29 DIAGNOSIS — G35 Multiple sclerosis: Secondary | ICD-10-CM | POA: Diagnosis not present

## 2017-06-29 DIAGNOSIS — G40209 Localization-related (focal) (partial) symptomatic epilepsy and epileptic syndromes with complex partial seizures, not intractable, without status epilepticus: Secondary | ICD-10-CM | POA: Diagnosis not present

## 2017-06-30 DIAGNOSIS — G40209 Localization-related (focal) (partial) symptomatic epilepsy and epileptic syndromes with complex partial seizures, not intractable, without status epilepticus: Secondary | ICD-10-CM | POA: Diagnosis not present

## 2017-06-30 DIAGNOSIS — G35 Multiple sclerosis: Secondary | ICD-10-CM | POA: Diagnosis not present

## 2017-06-30 DIAGNOSIS — G801 Spastic diplegic cerebral palsy: Secondary | ICD-10-CM | POA: Diagnosis not present

## 2017-06-30 DIAGNOSIS — M4802 Spinal stenosis, cervical region: Secondary | ICD-10-CM | POA: Diagnosis not present

## 2017-06-30 DIAGNOSIS — K529 Noninfective gastroenteritis and colitis, unspecified: Secondary | ICD-10-CM | POA: Diagnosis not present

## 2017-06-30 DIAGNOSIS — K589 Irritable bowel syndrome without diarrhea: Secondary | ICD-10-CM | POA: Diagnosis not present

## 2017-07-03 DIAGNOSIS — K589 Irritable bowel syndrome without diarrhea: Secondary | ICD-10-CM | POA: Diagnosis not present

## 2017-07-03 DIAGNOSIS — G801 Spastic diplegic cerebral palsy: Secondary | ICD-10-CM | POA: Diagnosis not present

## 2017-07-03 DIAGNOSIS — M4802 Spinal stenosis, cervical region: Secondary | ICD-10-CM | POA: Diagnosis not present

## 2017-07-03 DIAGNOSIS — G40209 Localization-related (focal) (partial) symptomatic epilepsy and epileptic syndromes with complex partial seizures, not intractable, without status epilepticus: Secondary | ICD-10-CM | POA: Diagnosis not present

## 2017-07-03 DIAGNOSIS — G35 Multiple sclerosis: Secondary | ICD-10-CM | POA: Diagnosis not present

## 2017-07-03 DIAGNOSIS — K529 Noninfective gastroenteritis and colitis, unspecified: Secondary | ICD-10-CM | POA: Diagnosis not present

## 2017-07-04 DIAGNOSIS — M4802 Spinal stenosis, cervical region: Secondary | ICD-10-CM | POA: Diagnosis not present

## 2017-07-04 DIAGNOSIS — G40209 Localization-related (focal) (partial) symptomatic epilepsy and epileptic syndromes with complex partial seizures, not intractable, without status epilepticus: Secondary | ICD-10-CM | POA: Diagnosis not present

## 2017-07-04 DIAGNOSIS — K529 Noninfective gastroenteritis and colitis, unspecified: Secondary | ICD-10-CM | POA: Diagnosis not present

## 2017-07-04 DIAGNOSIS — G35 Multiple sclerosis: Secondary | ICD-10-CM | POA: Diagnosis not present

## 2017-07-04 DIAGNOSIS — K589 Irritable bowel syndrome without diarrhea: Secondary | ICD-10-CM | POA: Diagnosis not present

## 2017-07-04 DIAGNOSIS — G801 Spastic diplegic cerebral palsy: Secondary | ICD-10-CM | POA: Diagnosis not present

## 2017-07-05 ENCOUNTER — Inpatient Hospital Stay: Payer: Medicare Other

## 2017-07-05 DIAGNOSIS — G35 Multiple sclerosis: Secondary | ICD-10-CM | POA: Diagnosis not present

## 2017-07-05 DIAGNOSIS — M4802 Spinal stenosis, cervical region: Secondary | ICD-10-CM | POA: Diagnosis not present

## 2017-07-05 DIAGNOSIS — K529 Noninfective gastroenteritis and colitis, unspecified: Secondary | ICD-10-CM | POA: Diagnosis not present

## 2017-07-05 DIAGNOSIS — K589 Irritable bowel syndrome without diarrhea: Secondary | ICD-10-CM | POA: Diagnosis not present

## 2017-07-05 DIAGNOSIS — G801 Spastic diplegic cerebral palsy: Secondary | ICD-10-CM | POA: Diagnosis not present

## 2017-07-05 DIAGNOSIS — G40209 Localization-related (focal) (partial) symptomatic epilepsy and epileptic syndromes with complex partial seizures, not intractable, without status epilepticus: Secondary | ICD-10-CM | POA: Diagnosis not present

## 2017-07-06 DIAGNOSIS — G35 Multiple sclerosis: Secondary | ICD-10-CM | POA: Diagnosis not present

## 2017-07-06 DIAGNOSIS — M4802 Spinal stenosis, cervical region: Secondary | ICD-10-CM | POA: Diagnosis not present

## 2017-07-06 DIAGNOSIS — G40209 Localization-related (focal) (partial) symptomatic epilepsy and epileptic syndromes with complex partial seizures, not intractable, without status epilepticus: Secondary | ICD-10-CM | POA: Diagnosis not present

## 2017-07-06 DIAGNOSIS — G801 Spastic diplegic cerebral palsy: Secondary | ICD-10-CM | POA: Diagnosis not present

## 2017-07-06 DIAGNOSIS — K589 Irritable bowel syndrome without diarrhea: Secondary | ICD-10-CM | POA: Diagnosis not present

## 2017-07-06 DIAGNOSIS — K529 Noninfective gastroenteritis and colitis, unspecified: Secondary | ICD-10-CM | POA: Diagnosis not present

## 2017-07-07 ENCOUNTER — Ambulatory Visit: Payer: Medicare Other | Admitting: Family Medicine

## 2017-07-08 DIAGNOSIS — K589 Irritable bowel syndrome without diarrhea: Secondary | ICD-10-CM | POA: Diagnosis not present

## 2017-07-08 DIAGNOSIS — G35 Multiple sclerosis: Secondary | ICD-10-CM | POA: Diagnosis not present

## 2017-07-08 DIAGNOSIS — M4802 Spinal stenosis, cervical region: Secondary | ICD-10-CM | POA: Diagnosis not present

## 2017-07-08 DIAGNOSIS — G40209 Localization-related (focal) (partial) symptomatic epilepsy and epileptic syndromes with complex partial seizures, not intractable, without status epilepticus: Secondary | ICD-10-CM | POA: Diagnosis not present

## 2017-07-08 DIAGNOSIS — G801 Spastic diplegic cerebral palsy: Secondary | ICD-10-CM | POA: Diagnosis not present

## 2017-07-08 DIAGNOSIS — K529 Noninfective gastroenteritis and colitis, unspecified: Secondary | ICD-10-CM | POA: Diagnosis not present

## 2017-07-10 DIAGNOSIS — K529 Noninfective gastroenteritis and colitis, unspecified: Secondary | ICD-10-CM | POA: Diagnosis not present

## 2017-07-10 DIAGNOSIS — M4802 Spinal stenosis, cervical region: Secondary | ICD-10-CM | POA: Diagnosis not present

## 2017-07-10 DIAGNOSIS — G801 Spastic diplegic cerebral palsy: Secondary | ICD-10-CM | POA: Diagnosis not present

## 2017-07-10 DIAGNOSIS — G35 Multiple sclerosis: Secondary | ICD-10-CM | POA: Diagnosis not present

## 2017-07-10 DIAGNOSIS — K589 Irritable bowel syndrome without diarrhea: Secondary | ICD-10-CM | POA: Diagnosis not present

## 2017-07-10 DIAGNOSIS — G40209 Localization-related (focal) (partial) symptomatic epilepsy and epileptic syndromes with complex partial seizures, not intractable, without status epilepticus: Secondary | ICD-10-CM | POA: Diagnosis not present

## 2017-07-11 DIAGNOSIS — M4802 Spinal stenosis, cervical region: Secondary | ICD-10-CM | POA: Diagnosis not present

## 2017-07-11 DIAGNOSIS — G40209 Localization-related (focal) (partial) symptomatic epilepsy and epileptic syndromes with complex partial seizures, not intractable, without status epilepticus: Secondary | ICD-10-CM | POA: Diagnosis not present

## 2017-07-11 DIAGNOSIS — K529 Noninfective gastroenteritis and colitis, unspecified: Secondary | ICD-10-CM | POA: Diagnosis not present

## 2017-07-11 DIAGNOSIS — G801 Spastic diplegic cerebral palsy: Secondary | ICD-10-CM | POA: Diagnosis not present

## 2017-07-11 DIAGNOSIS — K589 Irritable bowel syndrome without diarrhea: Secondary | ICD-10-CM | POA: Diagnosis not present

## 2017-07-11 DIAGNOSIS — G35 Multiple sclerosis: Secondary | ICD-10-CM | POA: Diagnosis not present

## 2017-07-12 DIAGNOSIS — G35 Multiple sclerosis: Secondary | ICD-10-CM | POA: Diagnosis not present

## 2017-07-12 DIAGNOSIS — G40209 Localization-related (focal) (partial) symptomatic epilepsy and epileptic syndromes with complex partial seizures, not intractable, without status epilepticus: Secondary | ICD-10-CM | POA: Diagnosis not present

## 2017-07-12 DIAGNOSIS — K529 Noninfective gastroenteritis and colitis, unspecified: Secondary | ICD-10-CM | POA: Diagnosis not present

## 2017-07-12 DIAGNOSIS — G801 Spastic diplegic cerebral palsy: Secondary | ICD-10-CM | POA: Diagnosis not present

## 2017-07-12 DIAGNOSIS — M4802 Spinal stenosis, cervical region: Secondary | ICD-10-CM | POA: Diagnosis not present

## 2017-07-12 DIAGNOSIS — K589 Irritable bowel syndrome without diarrhea: Secondary | ICD-10-CM | POA: Diagnosis not present

## 2017-07-13 DIAGNOSIS — M4802 Spinal stenosis, cervical region: Secondary | ICD-10-CM | POA: Diagnosis not present

## 2017-07-13 DIAGNOSIS — K589 Irritable bowel syndrome without diarrhea: Secondary | ICD-10-CM | POA: Diagnosis not present

## 2017-07-13 DIAGNOSIS — G35 Multiple sclerosis: Secondary | ICD-10-CM | POA: Diagnosis not present

## 2017-07-13 DIAGNOSIS — G40209 Localization-related (focal) (partial) symptomatic epilepsy and epileptic syndromes with complex partial seizures, not intractable, without status epilepticus: Secondary | ICD-10-CM | POA: Diagnosis not present

## 2017-07-13 DIAGNOSIS — K529 Noninfective gastroenteritis and colitis, unspecified: Secondary | ICD-10-CM | POA: Diagnosis not present

## 2017-07-13 DIAGNOSIS — G801 Spastic diplegic cerebral palsy: Secondary | ICD-10-CM | POA: Diagnosis not present

## 2017-07-14 ENCOUNTER — Inpatient Hospital Stay: Payer: Medicare Other

## 2017-07-14 DIAGNOSIS — M4802 Spinal stenosis, cervical region: Secondary | ICD-10-CM | POA: Diagnosis not present

## 2017-07-14 DIAGNOSIS — G801 Spastic diplegic cerebral palsy: Secondary | ICD-10-CM | POA: Diagnosis not present

## 2017-07-14 DIAGNOSIS — G35 Multiple sclerosis: Secondary | ICD-10-CM | POA: Diagnosis not present

## 2017-07-14 DIAGNOSIS — G40209 Localization-related (focal) (partial) symptomatic epilepsy and epileptic syndromes with complex partial seizures, not intractable, without status epilepticus: Secondary | ICD-10-CM | POA: Diagnosis not present

## 2017-07-14 DIAGNOSIS — K529 Noninfective gastroenteritis and colitis, unspecified: Secondary | ICD-10-CM | POA: Diagnosis not present

## 2017-07-14 DIAGNOSIS — K589 Irritable bowel syndrome without diarrhea: Secondary | ICD-10-CM | POA: Diagnosis not present

## 2017-07-17 DIAGNOSIS — G801 Spastic diplegic cerebral palsy: Secondary | ICD-10-CM | POA: Diagnosis not present

## 2017-07-17 DIAGNOSIS — G35 Multiple sclerosis: Secondary | ICD-10-CM | POA: Diagnosis not present

## 2017-07-17 DIAGNOSIS — M4802 Spinal stenosis, cervical region: Secondary | ICD-10-CM | POA: Diagnosis not present

## 2017-07-17 DIAGNOSIS — K529 Noninfective gastroenteritis and colitis, unspecified: Secondary | ICD-10-CM | POA: Diagnosis not present

## 2017-07-17 DIAGNOSIS — K589 Irritable bowel syndrome without diarrhea: Secondary | ICD-10-CM | POA: Diagnosis not present

## 2017-07-17 DIAGNOSIS — G40209 Localization-related (focal) (partial) symptomatic epilepsy and epileptic syndromes with complex partial seizures, not intractable, without status epilepticus: Secondary | ICD-10-CM | POA: Diagnosis not present

## 2017-07-18 DIAGNOSIS — M4802 Spinal stenosis, cervical region: Secondary | ICD-10-CM | POA: Diagnosis not present

## 2017-07-18 DIAGNOSIS — K529 Noninfective gastroenteritis and colitis, unspecified: Secondary | ICD-10-CM | POA: Diagnosis not present

## 2017-07-18 DIAGNOSIS — G40209 Localization-related (focal) (partial) symptomatic epilepsy and epileptic syndromes with complex partial seizures, not intractable, without status epilepticus: Secondary | ICD-10-CM | POA: Diagnosis not present

## 2017-07-18 DIAGNOSIS — G35 Multiple sclerosis: Secondary | ICD-10-CM | POA: Diagnosis not present

## 2017-07-18 DIAGNOSIS — G801 Spastic diplegic cerebral palsy: Secondary | ICD-10-CM | POA: Diagnosis not present

## 2017-07-18 DIAGNOSIS — K589 Irritable bowel syndrome without diarrhea: Secondary | ICD-10-CM | POA: Diagnosis not present

## 2017-07-19 DIAGNOSIS — K589 Irritable bowel syndrome without diarrhea: Secondary | ICD-10-CM | POA: Diagnosis not present

## 2017-07-19 DIAGNOSIS — K529 Noninfective gastroenteritis and colitis, unspecified: Secondary | ICD-10-CM | POA: Diagnosis not present

## 2017-07-19 DIAGNOSIS — G801 Spastic diplegic cerebral palsy: Secondary | ICD-10-CM | POA: Diagnosis not present

## 2017-07-19 DIAGNOSIS — M4802 Spinal stenosis, cervical region: Secondary | ICD-10-CM | POA: Diagnosis not present

## 2017-07-19 DIAGNOSIS — G35 Multiple sclerosis: Secondary | ICD-10-CM | POA: Diagnosis not present

## 2017-07-19 DIAGNOSIS — G40209 Localization-related (focal) (partial) symptomatic epilepsy and epileptic syndromes with complex partial seizures, not intractable, without status epilepticus: Secondary | ICD-10-CM | POA: Diagnosis not present

## 2017-07-20 ENCOUNTER — Telehealth: Payer: Self-pay | Admitting: Family Medicine

## 2017-07-20 ENCOUNTER — Inpatient Hospital Stay: Payer: Medicare Other | Attending: Oncology

## 2017-07-20 DIAGNOSIS — Z853 Personal history of malignant neoplasm of breast: Secondary | ICD-10-CM | POA: Diagnosis not present

## 2017-07-20 DIAGNOSIS — Z452 Encounter for adjustment and management of vascular access device: Secondary | ICD-10-CM | POA: Diagnosis not present

## 2017-07-20 DIAGNOSIS — G35 Multiple sclerosis: Secondary | ICD-10-CM | POA: Diagnosis not present

## 2017-07-20 DIAGNOSIS — Z95828 Presence of other vascular implants and grafts: Secondary | ICD-10-CM

## 2017-07-20 MED ORDER — HEPARIN SOD (PORK) LOCK FLUSH 100 UNIT/ML IV SOLN
500.0000 [IU] | Freq: Once | INTRAVENOUS | Status: AC
  Administered 2017-07-20: 500 [IU] via INTRAVENOUS

## 2017-07-20 MED ORDER — SODIUM CHLORIDE 0.9% FLUSH
10.0000 mL | Freq: Once | INTRAVENOUS | Status: AC
  Administered 2017-07-20: 10 mL via INTRAVENOUS
  Filled 2017-07-20: qty 10

## 2017-07-20 NOTE — Telephone Encounter (Signed)
Copied from [place] [phone]. Topic: General - Other >> Jul 20, 2017  1:44 PM Valla Leaver wrote: Reason for CRM: Colletta Maryland, OT  with [facility] calling to notify she needs an order for an OT reassessment next week because the patient has appointments this week and is not able to make the time.

## 2017-07-20 NOTE — Telephone Encounter (Signed)
Notified. 

## 2017-07-20 NOTE — Telephone Encounter (Signed)
That's fine, thank you  

## 2017-07-21 ENCOUNTER — Other Ambulatory Visit: Payer: Self-pay | Admitting: Oncology

## 2017-07-21 DIAGNOSIS — Z1231 Encounter for screening mammogram for malignant neoplasm of breast: Secondary | ICD-10-CM

## 2017-07-24 DIAGNOSIS — K529 Noninfective gastroenteritis and colitis, unspecified: Secondary | ICD-10-CM | POA: Diagnosis not present

## 2017-07-24 DIAGNOSIS — G40209 Localization-related (focal) (partial) symptomatic epilepsy and epileptic syndromes with complex partial seizures, not intractable, without status epilepticus: Secondary | ICD-10-CM | POA: Diagnosis not present

## 2017-07-24 DIAGNOSIS — G35 Multiple sclerosis: Secondary | ICD-10-CM | POA: Diagnosis not present

## 2017-07-24 DIAGNOSIS — K589 Irritable bowel syndrome without diarrhea: Secondary | ICD-10-CM | POA: Diagnosis not present

## 2017-07-24 DIAGNOSIS — G801 Spastic diplegic cerebral palsy: Secondary | ICD-10-CM | POA: Diagnosis not present

## 2017-07-24 DIAGNOSIS — M4802 Spinal stenosis, cervical region: Secondary | ICD-10-CM | POA: Diagnosis not present

## 2017-07-26 ENCOUNTER — Telehealth: Payer: Self-pay | Admitting: Family Medicine

## 2017-07-26 DIAGNOSIS — M4802 Spinal stenosis, cervical region: Secondary | ICD-10-CM | POA: Diagnosis not present

## 2017-07-26 DIAGNOSIS — G35 Multiple sclerosis: Secondary | ICD-10-CM | POA: Diagnosis not present

## 2017-07-26 DIAGNOSIS — G801 Spastic diplegic cerebral palsy: Secondary | ICD-10-CM | POA: Diagnosis not present

## 2017-07-26 DIAGNOSIS — K529 Noninfective gastroenteritis and colitis, unspecified: Secondary | ICD-10-CM | POA: Diagnosis not present

## 2017-07-26 DIAGNOSIS — G40209 Localization-related (focal) (partial) symptomatic epilepsy and epileptic syndromes with complex partial seizures, not intractable, without status epilepticus: Secondary | ICD-10-CM | POA: Diagnosis not present

## 2017-07-26 DIAGNOSIS — K589 Irritable bowel syndrome without diarrhea: Secondary | ICD-10-CM | POA: Diagnosis not present

## 2017-07-26 NOTE — Telephone Encounter (Signed)
Rebecca [name], with [facility] calling to get verbal orders for continuation for OT with frequency 2x a wk for 3wks. Please call back to approve or deny.

## 2017-07-26 NOTE — Telephone Encounter (Signed)
Copied from [place] [phone]. Topic: General - Other >> Jul 26, 2017 11:39 AM Cecelia Byars, NT wrote: Reason for CRM: Aldrin a PT  from [facility] called and needs verbal orders for  patient to continue home health  2 x a week for 3 weeks  please call [phone] please leave detailed message

## 2017-07-26 NOTE — Telephone Encounter (Signed)
That's fine, thank you  

## 2017-07-26 NOTE — Telephone Encounter (Signed)
Left detailed voicemail

## 2017-07-27 NOTE — Telephone Encounter (Signed)
Stephanie notified

## 2017-07-27 NOTE — Telephone Encounter (Signed)
That's fine thank you

## 2017-07-28 DIAGNOSIS — K529 Noninfective gastroenteritis and colitis, unspecified: Secondary | ICD-10-CM | POA: Diagnosis not present

## 2017-07-28 DIAGNOSIS — G801 Spastic diplegic cerebral palsy: Secondary | ICD-10-CM | POA: Diagnosis not present

## 2017-07-28 DIAGNOSIS — G35 Multiple sclerosis: Secondary | ICD-10-CM | POA: Diagnosis not present

## 2017-07-28 DIAGNOSIS — G40209 Localization-related (focal) (partial) symptomatic epilepsy and epileptic syndromes with complex partial seizures, not intractable, without status epilepticus: Secondary | ICD-10-CM | POA: Diagnosis not present

## 2017-07-28 DIAGNOSIS — M4802 Spinal stenosis, cervical region: Secondary | ICD-10-CM | POA: Diagnosis not present

## 2017-07-28 DIAGNOSIS — K589 Irritable bowel syndrome without diarrhea: Secondary | ICD-10-CM | POA: Diagnosis not present

## 2017-07-31 DIAGNOSIS — G801 Spastic diplegic cerebral palsy: Secondary | ICD-10-CM | POA: Diagnosis not present

## 2017-07-31 DIAGNOSIS — G40209 Localization-related (focal) (partial) symptomatic epilepsy and epileptic syndromes with complex partial seizures, not intractable, without status epilepticus: Secondary | ICD-10-CM | POA: Diagnosis not present

## 2017-07-31 DIAGNOSIS — M4802 Spinal stenosis, cervical region: Secondary | ICD-10-CM | POA: Diagnosis not present

## 2017-07-31 DIAGNOSIS — K529 Noninfective gastroenteritis and colitis, unspecified: Secondary | ICD-10-CM | POA: Diagnosis not present

## 2017-07-31 DIAGNOSIS — K589 Irritable bowel syndrome without diarrhea: Secondary | ICD-10-CM | POA: Diagnosis not present

## 2017-07-31 DIAGNOSIS — G35 Multiple sclerosis: Secondary | ICD-10-CM | POA: Diagnosis not present

## 2017-08-01 DIAGNOSIS — G35 Multiple sclerosis: Secondary | ICD-10-CM | POA: Diagnosis not present

## 2017-08-01 DIAGNOSIS — M4802 Spinal stenosis, cervical region: Secondary | ICD-10-CM | POA: Diagnosis not present

## 2017-08-01 DIAGNOSIS — K529 Noninfective gastroenteritis and colitis, unspecified: Secondary | ICD-10-CM | POA: Diagnosis not present

## 2017-08-01 DIAGNOSIS — K589 Irritable bowel syndrome without diarrhea: Secondary | ICD-10-CM | POA: Diagnosis not present

## 2017-08-01 DIAGNOSIS — G40209 Localization-related (focal) (partial) symptomatic epilepsy and epileptic syndromes with complex partial seizures, not intractable, without status epilepticus: Secondary | ICD-10-CM | POA: Diagnosis not present

## 2017-08-01 DIAGNOSIS — G801 Spastic diplegic cerebral palsy: Secondary | ICD-10-CM | POA: Diagnosis not present

## 2017-08-02 ENCOUNTER — Ambulatory Visit
Admission: RE | Admit: 2017-08-02 | Discharge: 2017-08-02 | Disposition: A | Discharge status: Home / Self Care | Payer: Medicare Other | Source: Ambulatory Visit | Attending: Oncology | Admitting: Oncology

## 2017-08-02 DIAGNOSIS — Z1231 Encounter for screening mammogram for malignant neoplasm of breast: Secondary | ICD-10-CM

## 2017-08-04 ENCOUNTER — Telehealth: Payer: Self-pay

## 2017-08-04 DIAGNOSIS — G35 Multiple sclerosis: Secondary | ICD-10-CM | POA: Diagnosis not present

## 2017-08-04 DIAGNOSIS — K529 Noninfective gastroenteritis and colitis, unspecified: Secondary | ICD-10-CM | POA: Diagnosis not present

## 2017-08-04 DIAGNOSIS — K589 Irritable bowel syndrome without diarrhea: Secondary | ICD-10-CM | POA: Diagnosis not present

## 2017-08-04 DIAGNOSIS — G40209 Localization-related (focal) (partial) symptomatic epilepsy and epileptic syndromes with complex partial seizures, not intractable, without status epilepticus: Secondary | ICD-10-CM | POA: Diagnosis not present

## 2017-08-04 DIAGNOSIS — M4802 Spinal stenosis, cervical region: Secondary | ICD-10-CM | POA: Diagnosis not present

## 2017-08-04 DIAGNOSIS — G801 Spastic diplegic cerebral palsy: Secondary | ICD-10-CM | POA: Diagnosis not present

## 2017-08-04 NOTE — Telephone Encounter (Signed)
The June 19th CRM was already addressed in a separate phone note Okay for missed visit

## 2017-08-04 NOTE — Telephone Encounter (Signed)
Copied from [place] [phone]. Topic: General - Other >> Jul 26, 2017 11:39 AM Cecelia Byars, NT wrote: Reason for CRM: Aldrin a PT  from [facility] called and needs verbal orders for  patient to continue home health  2 x a week for 3 weeks  please call [phone] please leave detailed message   >> Aug 04, 2017  1:37 PM Oneta Rack wrote: Osval[place] Human name: Jeneen Rinks  Relation to pt: PT Assistance from [facility]    Call back number: [phone]   Reason for call:  Patient missed appointment for PT due to patient not feeling well

## 2017-08-07 ENCOUNTER — Telehealth: Payer: Self-pay | Admitting: Physical Medicine & Rehabilitation

## 2017-08-07 DIAGNOSIS — K529 Noninfective gastroenteritis and colitis, unspecified: Secondary | ICD-10-CM | POA: Diagnosis not present

## 2017-08-07 DIAGNOSIS — G801 Spastic diplegic cerebral palsy: Secondary | ICD-10-CM | POA: Diagnosis not present

## 2017-08-07 DIAGNOSIS — K589 Irritable bowel syndrome without diarrhea: Secondary | ICD-10-CM | POA: Diagnosis not present

## 2017-08-07 DIAGNOSIS — G40209 Localization-related (focal) (partial) symptomatic epilepsy and epileptic syndromes with complex partial seizures, not intractable, without status epilepticus: Secondary | ICD-10-CM | POA: Diagnosis not present

## 2017-08-07 DIAGNOSIS — M4802 Spinal stenosis, cervical region: Secondary | ICD-10-CM | POA: Diagnosis not present

## 2017-08-07 DIAGNOSIS — G35 Multiple sclerosis: Secondary | ICD-10-CM | POA: Diagnosis not present

## 2017-08-07 NOTE — Telephone Encounter (Signed)
New Message  Pt c/o medication issue:  1. Name of Medication:  baclofen 10 mg tablet Take 1 to 2 tablets by mouth 3 times day  tizanidine (Zanaflex) 4 mg tablet take .5 tablets (2mg  total) by mouth 2 (two) times daily as needed for muscle spasms  2. How are you currently taking this medication (dosage and times per day)? See above  3. Are you having a reaction (difficulty breathing--STAT)? N/A  4. What is your medication issue?  Pt verbalized medications listed above counteract with each other and she is needing baclofen rx refilled and pharmacist will not allow refill for pt due to drug interaction.  Please f/u with pt

## 2017-08-08 DIAGNOSIS — K529 Noninfective gastroenteritis and colitis, unspecified: Secondary | ICD-10-CM | POA: Diagnosis not present

## 2017-08-08 DIAGNOSIS — G35 Multiple sclerosis: Secondary | ICD-10-CM | POA: Diagnosis not present

## 2017-08-08 DIAGNOSIS — M4802 Spinal stenosis, cervical region: Secondary | ICD-10-CM | POA: Diagnosis not present

## 2017-08-08 DIAGNOSIS — G40209 Localization-related (focal) (partial) symptomatic epilepsy and epileptic syndromes with complex partial seizures, not intractable, without status epilepticus: Secondary | ICD-10-CM | POA: Diagnosis not present

## 2017-08-08 DIAGNOSIS — G801 Spastic diplegic cerebral palsy: Secondary | ICD-10-CM | POA: Diagnosis not present

## 2017-08-08 DIAGNOSIS — K589 Irritable bowel syndrome without diarrhea: Secondary | ICD-10-CM | POA: Diagnosis not present

## 2017-08-08 MED ORDER — TIZANIDINE HCL 4 MG PO TABS: 2.0000 mg | ORAL_TABLET | Freq: Two times a day (BID) | ORAL | 1 refills | Status: DC | PRN

## 2017-08-08 MED ORDER — BACLOFEN 10 MG PO TABS: ORAL_TABLET | ORAL | 4 refills | Status: DC

## 2017-08-08 NOTE — Addendum Note (Signed)
Addended by: Caro Hight on: 08/08/2017 12:03 PM   Modules accepted: Orders

## 2017-08-08 NOTE — Telephone Encounter (Signed)
Notified  Rebecca [name].

## 2017-08-08 NOTE — Telephone Encounter (Signed)
Is she having an issue with medications interacting with each other or just require a refill that pharmacy is not refilling? If it is a refill issue and the 2 medications are working well for her together, as this was a trial on last visit, we can provide a refill and let pharmacy know that it is okay. If she is having an issue with her medications interacting then she should go back to just using the baclofen. Thanks

## 2017-08-08 NOTE — Telephone Encounter (Signed)
Left message on pharmacy line ([facility]) that you are aware and want her to continue with the medications.

## 2017-08-09 DIAGNOSIS — G40209 Localization-related (focal) (partial) symptomatic epilepsy and epileptic syndromes with complex partial seizures, not intractable, without status epilepticus: Secondary | ICD-10-CM | POA: Diagnosis not present

## 2017-08-09 DIAGNOSIS — G801 Spastic diplegic cerebral palsy: Secondary | ICD-10-CM | POA: Diagnosis not present

## 2017-08-09 DIAGNOSIS — K529 Noninfective gastroenteritis and colitis, unspecified: Secondary | ICD-10-CM | POA: Diagnosis not present

## 2017-08-09 DIAGNOSIS — G35 Multiple sclerosis: Secondary | ICD-10-CM | POA: Diagnosis not present

## 2017-08-09 DIAGNOSIS — K589 Irritable bowel syndrome without diarrhea: Secondary | ICD-10-CM | POA: Diagnosis not present

## 2017-08-09 DIAGNOSIS — M4802 Spinal stenosis, cervical region: Secondary | ICD-10-CM | POA: Diagnosis not present

## 2017-08-10 DIAGNOSIS — M4802 Spinal stenosis, cervical region: Secondary | ICD-10-CM | POA: Diagnosis not present

## 2017-08-10 DIAGNOSIS — G801 Spastic diplegic cerebral palsy: Secondary | ICD-10-CM | POA: Diagnosis not present

## 2017-08-10 DIAGNOSIS — G35 Multiple sclerosis: Secondary | ICD-10-CM | POA: Diagnosis not present

## 2017-08-10 DIAGNOSIS — G40209 Localization-related (focal) (partial) symptomatic epilepsy and epileptic syndromes with complex partial seizures, not intractable, without status epilepticus: Secondary | ICD-10-CM | POA: Diagnosis not present

## 2017-08-10 DIAGNOSIS — K529 Noninfective gastroenteritis and colitis, unspecified: Secondary | ICD-10-CM | POA: Diagnosis not present

## 2017-08-10 DIAGNOSIS — K589 Irritable bowel syndrome without diarrhea: Secondary | ICD-10-CM | POA: Diagnosis not present

## 2017-08-11 DIAGNOSIS — M4802 Spinal stenosis, cervical region: Secondary | ICD-10-CM | POA: Diagnosis not present

## 2017-08-11 DIAGNOSIS — G40209 Localization-related (focal) (partial) symptomatic epilepsy and epileptic syndromes with complex partial seizures, not intractable, without status epilepticus: Secondary | ICD-10-CM | POA: Diagnosis not present

## 2017-08-11 DIAGNOSIS — K529 Noninfective gastroenteritis and colitis, unspecified: Secondary | ICD-10-CM | POA: Diagnosis not present

## 2017-08-11 DIAGNOSIS — K589 Irritable bowel syndrome without diarrhea: Secondary | ICD-10-CM | POA: Diagnosis not present

## 2017-08-11 DIAGNOSIS — G35 Multiple sclerosis: Secondary | ICD-10-CM | POA: Diagnosis not present

## 2017-08-11 DIAGNOSIS — G801 Spastic diplegic cerebral palsy: Secondary | ICD-10-CM | POA: Diagnosis not present

## 2017-08-14 ENCOUNTER — Ambulatory Visit (INDEPENDENT_AMBULATORY_CARE_PROVIDER_SITE_OTHER): Payer: Medicare Other | Admitting: Diagnostic Neuroimaging

## 2017-08-14 ENCOUNTER — Encounter: Payer: Self-pay | Admitting: Diagnostic Neuroimaging

## 2017-08-14 VITALS — BP 128/73 | HR 64

## 2017-08-14 DIAGNOSIS — R5383 Other fatigue: Secondary | ICD-10-CM | POA: Diagnosis not present

## 2017-08-14 DIAGNOSIS — G35 Multiple sclerosis: Secondary | ICD-10-CM | POA: Diagnosis not present

## 2017-08-14 DIAGNOSIS — M62838 Other muscle spasm: Secondary | ICD-10-CM

## 2017-08-14 DIAGNOSIS — G40909 Epilepsy, unspecified, not intractable, without status epilepticus: Secondary | ICD-10-CM | POA: Diagnosis not present

## 2017-08-14 MED ORDER — LEVETIRACETAM 500 MG PO TABS: 500.0000 mg | ORAL_TABLET | Freq: Two times a day (BID) | ORAL | 4 refills | Status: DC

## 2017-08-14 MED ORDER — AMANTADINE HCL 100 MG PO CAPS: 100.0000 mg | ORAL_CAPSULE | Freq: Three times a day (TID) | ORAL | 4 refills | Status: DC

## 2017-08-14 NOTE — Progress Notes (Signed)
[facility]  PATIENT: Rebecca [name] DOB: September 21, 1960  REFERRING CLINICIAN:  HISTORY FROM: patient, husband REASON FOR VISIT: follow up   HISTORICAL  CHIEF COMPLAINT:  Chief Complaint  Patient presents with  . Follow-up    Patient reports that she has been doing well.   . Multiple Sclerosis    HISTORY OF PRESENT ILLNESS:   UPDATE (08/14/17, VRP): Since last visit, doing well. Tolerating meds. No alleviating or aggravating factors. No seizures. Tolerating rebif. Now on baclofen and tizanidine for muscle spasms (per Dr. Posey Pronto).  UPDATE (02/13/17, VRP): Since last visit, doing well until Dec 2018 --> had a fall, went to ER and dx'd with UTI. Now on indwelling foley. Has appt with urology soon. Tolerating other meds including rebif. No alleviating or aggravating factors. No seizures.   UPDATE 08/01/16: Since last visit, was admitted to hospital in April 2018 for UTI and generalized weakness. MRI brain, c and t spine were neg for new or acute plaques.   UPDATE 11/30/15: Since last visit, doing well. HA are improved. MS stable. Baclofen, LEV, amantadine, rebif working. No seizures.   UPDATE 08/06/15: Since last visit, overall stable. MS sxs are stable. However having new headaches (severe, 10 days out of last 1 month, eyes, no nausea/vomiting, positive phonophobia). Never had these kind of headaches before. Headaches are better in last few days.   UPDATE 03/23/15: Since last visit, doing well. Back on rebif. Ready to try PT evaluation again. Tolerating meds.   UPDATE 09/16/14: Since last visit, completed breast CA treatments (adriamycin, cytoxan, taxol; couldn't complete the taxol cycles). Now ready for restarting MS treatments. No focal flares of MS, but had generalized slowness, fatigue, muscle spasms.   UPDATE 02/13/14: Since last visit, has been diagnosed with triple negative breast CA, s/p right mastectomy. Now planning to have chemotherapy. No new neuro symptoms.  Tolerating rebif.   UPDATE 11/13/13: Since last visit, doing well. Getting wrap / lymphedema therapy via Amy (OT) at [facility] with great results. Significant improvement. Tolerating rebif. No new lesions or symptoms.  UPDATE 08/14/13: Since last visit, has continued on rebif. BLE edema still a problem. Saw cardiology, tried diuretic, and also trying wrap/massage therapy next week. Walking and mobility gradually worsening.   UPDATE 02/13/13 (LL): The patient returns for followup visit. Since last visit she states that she has seen Dr. Katy Fitch and that she needs new prescription eyeglasses for vision problems that the double vision resolved. She is tolerating rebif. Her biggest concern is that her bilateral leg edema is making it difficult for her to move her legs or ambulate. She is not consistently wearing her TED hose. She denies any shortness of breath, but is mostly wheelchair-bound. She states that she feels like she is progressively getting weaker. She asked for information about Tecfidera. She said she had a cardiology consultation in the past when she started Gilenya, but that Dr. has since retired. She states she was diagnosed with aortic insufficiency. Her urgency and frequency of urination is getting worse, she asked if she can be increased on her medication.   UPDATE 11/07/12: Since last visit, doing well, until 1 month ago and developed fluctuating double vision/blurred vision. Affects right greater than left eye. It is present even if she closes the right or left eyes.   UPDATE 04/20/12: Since last visit, has come off gabapentin. BLE swelling stable. S/p ACDF surg per Dr. Vertell Limber and doing well. Tolerating rebif. Has new PCP and also getting PT. Few  days ago, developed some numbness in right hand, similar to pre-op symptoms which improved post-op. No neck pain.   UPDATE 01/10/12: Noticed worsening swelling to bilateral lower extremities since 01/04/12. She is also having throbbing pain to shins. Difficulty  with wearing sneakers. Unable to use leg compression machine. Denies shortness of breath.   UPDATE 10/27/11: Having surgery on c-spine fusion on November 17, 2011. Concerned about recovery time and her ability to perform ADL's. Lives with aunt. Wanting to know if she needs to have someone come to home or stay at rehab. Intermittent blurred vision las 3 weeks.   UPDATE 07/27/11: Doing better with shingles and post-herpetic neuralgia pain; on gralise. Now with worsening RLE weakness, fatigue, vision changes, speech diff. Discussed tx options.   UPDATE 06/02/11: On 05/27/11 developed right lower tooth pain; 4/20 developed blisters and rash on right chin and jaw. 4/22 went to dentist, diagnosed with tooth infx and given amoxicillin. Rash more significant. 06/01/11, went to dermatologist and dx'd possible shingles. Given valtrex and started on 4/24. Lesions still oozing. Has right ear and right facial pain, severe. No eye pain.   UPDATE 03/02/10: Patient was getting worked up for first dose observation for Gilenya, however on 02/28/2011, patient had a suspected seizure. She was in the bathroom at home, stood up and without warning have loss of consciousness. Her mother found her on the floor groaning with some twitching movements of her upper extremities, and her legs outstretched in front of her. No incontinence or tongue biting. It took approximately 20-30 minutes for her to wake up. She was amnestic events until the next day.  In retrospect, patient had 4 or 5 episodes of syncope versus seizure in the early 1980s. She was never on antiseizure medication. Patient was also on Ampyra since past 2 years.   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 Last dose tysabri on 10/29/10. She would like to switch to [place]. No MS flares. Some increased fatigue. Also asking if amantadine can be stopped to see if peripheral edema improves.   PRIOR HPI (11/02/10): 57 year old right-handed female with history of multiple sclerosis.  Here for transfer of care from [place]. 2001 - patient developed left leg numbness and weakness, followed by right facial weakness and slurred speech. She was evaluated with MRI of the brain, diagnosed with multiple sclerosis and started on Copaxone. She did not have lumbar puncture or spinal cord MRI. 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 She thinks she was off of Copaxone for approximately 2 years. 2008-9 - patient was evaluated at [facility] and started on Tysabri. She thinks she has been on Tysabri for approximately 3-1/2 years. Last dose was last week at [facility]. She continues to decline in terms of mobility. She is to walk using a cane, then started using a walker, now uses a transport chair occasionally. Patient continues to have intermittent right facial twitching and numbness. She is on amantadine, baclofen, Amprya, and oxybutynin for MS symptoms.    REVIEW OF SYSTEMS: Full 14 system review of systems performed and negative except: leg swelling joint swelling dizziness numbness leg swelling murmur incontinence fatigue.     ALLERGIES: Allergies  Allergen Reactions  . Fentanyl Nausea And Vomiting and Nausea Only    vomiting Was given in PACU x3 each time patient got sick vomiting Was given in PACU x3 each time patient got sick  . Sulfa Antibiotics Hives and Other (See Comments)  Light headed, over heated    HOME MEDICATIONS: Outpatient Medications Prior to Visit  Medication Sig Dispense Refill  . amantadine (SYMMETREL) 100 MG capsule Take 1 capsule (100 mg total) by mouth 3 (three) times daily. 270 capsule 4  . baclofen (LIORESAL) 10 MG tablet TAKE 1 TO 2 TABLETS BY MOUTH 3 TIMES A DAY 180 tablet 4  . glucosamine-chondroitin 500-400 MG tablet Take 1 tablet by mouth 4 (four) times daily.    Marland Kitchen ibuprofen (ADVIL,MOTRIN) 600 MG tablet Take 1 tablet (600 mg total) by mouth every 8 (eight) hours as needed. 180 tablet 1  .  Incontinence Supply Disposable (PREVAIL WET WIPES) MISC Apply 1 each topically daily as needed. (dx neurogenic bladder N31.9, LON 99 months) 96 each 11  . Interferon Beta-1a (REBIF REBIDOSE) 44 MCG/0.5ML SOAJ Inject 0.5 mLs into the skin 3 (three) times a week. 12 mL 11  . levETIRAcetam (KEPPRA) 500 MG tablet Take 1 tablet (500 mg total) by mouth 2 (two) times daily. 180 tablet 2  . Multiple Vitamins-Minerals (MULTIVITAMIN PO) Take 1 tablet by mouth 2 (two) times daily.     Marland Kitchen oxybutynin (DITROPAN-XL) 10 MG 24 hr tablet Take 2 tablets (20 mg total) by mouth daily. 180 tablet 3  . tiZANidine (ZANAFLEX) 4 MG tablet Take 0.5 tablets (2 mg total) by mouth 2 (two) times daily as needed for muscle spasms. 30 tablet 1  . nitrofurantoin, macrocrystal-monohydrate, (MACROBID) 100 MG capsule Take 1 capsule (100 mg total) by mouth every 12 (twelve) hours. 14 capsule 0   No facility-administered medications prior to visit.     PAST MEDICAL HISTORY: Past Medical History:  Diagnosis Date  . Aortic valve disease    Mild AS / AI - most recent Echo demonstrated tricuspid aortic valve.  . Bacterial endocarditis    History of .  Marland Kitchen Bilateral lower extremity edema    Noncardiac.  Chronic. LE Venous dopplers - negative for DVT.; Echocardiogram January 2016: Normal EF with normal wall motion and valve function. Only grade 1 diastolic dysfunction. EF 60-65%. Mild MR  . Breast cancer ([place]) 12-31-13   Right breast, 12:00, 1.5 cm, T1c,N0 invasive mammary carcinoma, triple negative. --> Rx with Chemo  . Cervical stenosis of spine   . Herpes zoster   . IBS (irritable bowel syndrome)   . Lymphedema    has legs wrapped at [facility]  . Multiple sclerosis ([place]) 2001   Walks from room to room @ home; but Wheelchair when going out.  Marland Kitchen Neuromuscular disorder ([place])    MS  . Seizures ([place])    Takes Keppra  . Syncope and collapse     PAST SURGICAL HISTORY: Past Surgical History:  Procedure Laterality Date  . ANKLE SURGERY      Left  . ANKLE SURGERY    . ANTERIOR CERVICAL DECOMP/DISCECTOMY FUSION  11/17/2011   Procedure: ANTERIOR CERVICAL DECOMPRESSION/DISCECTOMY FUSION 2 LEVELS;  Surgeon: Erline Levine, MD;  Location: [facility];  Service: Neurosurgery;  Laterality: N/A;  Cervical Five-Six Six-Seven Anterior cervical decompression/diskectomy/fusion  . BREAST BIOPSY Right 12-31-13   invasive mammary  . BREAST SURGERY Right 02/03/2014   Right simple mastectomy with sentinel node biopsy.  . CHOLECYSTECTOMY    . COLONOSCOPY  2014  . Lower extremity venous Dopplers  Feb 27, 2013   No LE DVT  . MASTECTOMY Right 2015  . Port a cath insertion Right 01/19/2010  . PORT-A-CATH REMOVAL     right  . PORT-A-CATH REMOVAL Right 09/03/2013  Procedure: REMOVAL PORT-A-CATH;  Surgeon: Conrad Henderson, MD;  Location: [place];  Service: Vascular;  Laterality: Right;  . TRANSTHORACIC ECHOCARDIOGRAM  03/2013; 02/2014   a) Normal LV size and function with EF 60-65%.; Cannot exclude bicuspid aortic valve with mild AS and mild AI.; b) Normal EF with normal wall motion and valve function x Mild MR. G2 DD. EF 60-65%. Tricuspid AoV  . UPPER GI ENDOSCOPY  2014    FAMILY HISTORY: Family History  Problem Relation Age of Onset  . Cancer Father        skin  . Heart disease Father   . Heart attack Father        heart attack in his 8's  . Thyroid disease Sister   . Ovarian cancer Cousin   . Breast cancer Maternal Aunt 60  . Breast cancer Maternal Grandmother 62  . Bladder Cancer Neg Hx   . Kidney cancer Neg Hx     SOCIAL HISTORY:  Social History   Socioeconomic History  . Marital status: Married    Spouse name: don  . Number of children: 0  . Years of education: 69  . Highest education level: Not on file  Occupational History  . Occupation: disability  Social Needs  . Financial resource strain: Not on file  . Food insecurity:    Worry: Not on file    Inability: Not on file  . Transportation needs:    Medical: Not on  file    Non-medical: Not on file  Tobacco Use  . Smoking status: Never Smoker  . Smokeless tobacco: Never Used  Substance and Sexual Activity  . Alcohol use: No  . Drug use: No  . Sexual activity: Not Currently    Partners: Male  Lifestyle  . Physical activity:    Days per week: Not on file    Minutes per session: Not on file  . Stress: Not on file  Relationships  . Social connections:    Talks on phone: Not on file    Gets together: Not on file    Attends religious service: Not on file    Active member of club or organization: Not on file    Attends meetings of clubs or organizations: Not on file    Relationship status: Not on file  . Intimate partner violence:    Fear of current or ex partner: Not on file    Emotionally abused: Not on file    Physically abused: Not on file    Forced sexual activity: Not on file  Other Topics Concern  . Not on file  Social History Narrative   She is married. Recently moved back to [place] after being in [place] for some time. She is accompanied by her husband and aunt.   Never smoked. Never used alcohol.     PHYSICAL EXAM  Vitals:   08/14/17 1347  BP: 128/73  Pulse: 64    Not recorded      There is no height or weight on file to calculate BMI.  GENERAL EXAM:  General: Patient is awake, alert and in no acute distress. Well developed and groomed.  Neck: Neck is supple.  Cardiovascular: Heart is regular rate and rhythm with mild systolic murmur. PERIPHERAL EDEMA IN BLE.   Neurologic Exam  Mental Status: Awake, alert. Language is fluent and comprehension intact. Cranial Nerves: Pupils are equal and reactive to light. Visual fields are full to confrontation. Conjugate eye movements are full and symmetric --> MILD  SACCADIC BREAKDOWN OF SMOOTH PURSUIT. Facial sensation and strength are symmetric. Hearing is intact. Palate elevated symmetrically and uvula is midline. Shoulder shrug is symmetric. Tongue is midline.  Motor:  Normal bulk and tone. Full strength in the upper extremities; BLE (1-2/5 PROX, 3/5 DISTAL; RIGHT WEAKER THAN LEFT), INCREASED TONE IN BLE. No pronator drift.  Sensory: Intact and symmetric to light touch.  Coordination: FTN --> MILD DYSMETRIA BUE Reflexes: Deep tendon reflexes in the upper and lower extremity are present and symmetric; TRACE AT KNEES AND ANKLES. MILD PERIPHERAL EDEMA. Gait and Station: [place].     DIAGNOSTIC DATA (LABS, IMAGING, TESTING) - I reviewed patient records, labs, notes, testing and imaging myself where available.  Lab Results  Component Value Date   WBC 9.5 01/17/2017   HGB 13.0 01/17/2017   HCT 38.6 01/17/2017   MCV 90.2 01/17/2017   PLT 129 (L) 01/17/2017      Component Value Date/Time   NA 137 01/17/2017 0210   NA 143 07/28/2016 1520   NA 139 06/05/2014 0951   K 3.6 01/17/2017 0210   K 3.8 06/05/2014 0951   CL 102 01/17/2017 0210   CL 107 06/05/2014 0951   CO2 23 01/17/2017 0210   CO2 28 06/05/2014 0951   GLUCOSE 132 (H) 01/17/2017 0210   GLUCOSE 130 (H) 06/05/2014 0951   BUN 12 01/17/2017 0210   BUN 12 07/28/2016 1520   BUN 12 06/05/2014 0951   CREATININE 0.57 01/17/2017 0210   CREATININE 0.49 (L) 11/25/2015 1216   CALCIUM 9.3 01/17/2017 0210   CALCIUM 9.4 06/05/2014 0951   PROT 7.0 07/28/2016 1520   PROT 7.0 05/29/2014 0949   ALBUMIN 4.3 07/28/2016 1520   ALBUMIN 4.1 05/29/2014 0949   AST 21 07/28/2016 1520   AST 24 05/29/2014 0949   ALT 22 07/28/2016 1520   ALT 24 05/29/2014 0949   ALKPHOS 84 07/28/2016 1520   ALKPHOS 58 05/29/2014 0949   BILITOT 0.3 07/28/2016 1520   BILITOT 0.6 05/29/2014 0949   GFRNONAA >60 01/17/2017 0210   GFRNONAA >89 11/25/2015 1216   GFRAA >60 01/17/2017 0210   GFRAA >89 11/25/2015 1216   Lab Results  Component Value Date   CHOL 214 (H) 11/25/2015   No results found for: HGBA1C Lab Results  Component Value Date   VITAMINB12 599 07/28/2016   Lab Results  Component Value Date   TSH 1.83  11/25/2015     08/21/13 MRI brain (with and without) demonstrating:  1. Multiple periventricular and subcortical chronic demyelinating plaques. Some of these are confluent. Some are hypointense on T1.  2. No acute plaques.  3. No significant change from MRI on 11/20/12.   08/21/13 MRI cervical spine (with and without) demonstrating:  1. At C2-3: disc bulging and facet hypertrophy with mild spinal stenosis and mild left foraminal stenosis.  2. At C5-6: uncovertebral joint hypertrophy with mild right foraminal stenosis.  3. Multiple chronic demyelinating plaques from C2 to T1. No abnormal enhancing lesions.  4. Compared to MRI from 10/04/11, there has been progression of degenerative spine disease at C2-3. Also there has been interval ACDF from C5-C7. No significant change in spinal cord lesions.  08/21/13 MRI thoracic spine (with and without) demonstrating:  1. Multiple spinal cord chronic demyelinating plaques from C7 to T12-L1.  2. No acute plaques.  3. ACDF at C5-C7.  4. Compared to prior MRI from 08/03/11, the ACDF is new. Overall no new demyelinating plaques.  09/30/14 MRI brain  - showing  2/flare foci in the right cervical hemisphere, pons, thalami and cerebral hemispheres in a pattern and configuration consistent with the diagnosis of multiple sclerosis. None of the foci enhances after contrast administration. There are no acute findings. When compared to the MRI dated 08/21/2013, there has been no interval change.  10/07/15 MRI brain  - Moderately severe white matter changes consistent with multiple sclerosis are stable since the prior MRI. - Negative for metastatic disease.  No acute abnormality.  05/14/16 MRI HEAD: [I reviewed images myself and agree with interpretation. -VRP]  - Stable examination: Severe chronic demyelination without new lesions or acute inflammation. No parenchymal brain volume loss for age.  05/14/16 MRI CERVICAL SPINE:  - Moderately motion degraded examination.  Chronic demyelination without myelomalacia. No enhancement to suggest acute inflammation. - Status post C5 through C7 ACDF with suspected nonunion C6-7, recommend cervical spine radiographs. - Moderate canal stenosis C4-5 consistent with adjacent segment disease. Mild canal stenosis C2-3 and C3-4.  - Multilevel neural foraminal narrowing: Severe on the RIGHT at C4-5.  05/14/16 MRI THORACIC SPINE:  - Suspected chronic demyelination of the upper thoracic spinal cord though, limited assessment due to patient motion. No enhancement to suggest acute inflammation. - Early degenerative change of thoracic spine without canal stenosis or neural foraminal narrowing.  07/28/16 cervical spine xray  1. C5 through C7 anterior interbody fusion. Hardware intact. Anatomic alignment. 2. Diffuse osteopenia and degenerative change.  01/17/17 MRI lumbar  1. Stable T12 cord lesion likely representing demyelinating plaque. 2. Mild lumbar spondylosis greatest at L4-5 and L5-S1 levels. 3. Mild left L4-5 and L5-S1 foraminal stenosis. Small left L5-S1 subarticular disc protrusion with contact on descending left S1 nerve root in the lateral recess.     ASSESSMENT AND PLAN  58 y.o. year old female here with multiple sclerosis, initially on copaxone, then was on Tysabri in the past (x 3.5 years); then became JCV +. Last dose tysabri 10/29/10. Switched to [place] on 04/25/11.   Had brief seizure before starting gilenya, now on levetiracetam.   Then with right V2, V3 herpes zoster on 05/27/11. Gilenya stopped. Shingles resolved.  Then on rebif. Also s/p ACDF C5-6 (Oct 2013) for spinal stenosis.   Now with breast CA diagnosis, s/p right mastectomy, s/p adriamycin, cytoxan, taxol. Now treatments have been completed.   Now back on rebif (since Jan 2017).  Now with new onset headaches (June 2017). MRI brain unremarkable. HA now improving.    Dx:  Multiple sclerosis ([place])  Seizure disorder ([facility])  Muscle  spasm  Other fatigue     PLAN:    MULTIPLE SCLEROSIS (established problem, stable) - continue rebif  URINARY INCONTINENCE / RECURRENT UTI - continue oxybutynin - follow up with urology  MUSCLE SPASMS - continue baclofen and tizanidine for muscle spasms (per Dr. Posey Pronto; stable)  MS fatigue - continue amantadine for fatigue (stable)  SEIZURE DISORDER - continue levetiracetam for seizure d/o (stable)  Meds ordered this encounter  Medications  . amantadine (SYMMETREL) 100 MG capsule    Sig: Take 1 capsule (100 mg total) by mouth 3 (three) times daily.    Dispense:  270 capsule    Refill:  4  . levETIRAcetam (KEPPRA) 500 MG tablet    Sig: Take 1 tablet (500 mg total) by mouth 2 (two) times daily.    Dispense:  180 tablet    Refill:  4   Return in about 8 months (around 04/15/2018).    Penni Bombard, MD 05/10/3534, 1:44 PM Certified  in Neurology, Neurophysiology and Neuroimaging  [facility] [address], [place], [place] [address] [phone]

## 2017-08-15 DIAGNOSIS — B351 Tinea unguium: Secondary | ICD-10-CM | POA: Diagnosis not present

## 2017-08-15 DIAGNOSIS — M79675 Pain in left toe(s): Secondary | ICD-10-CM | POA: Diagnosis not present

## 2017-08-15 DIAGNOSIS — M79674 Pain in right toe(s): Secondary | ICD-10-CM | POA: Diagnosis not present

## 2017-08-16 ENCOUNTER — Telehealth: Payer: Self-pay | Admitting: Family Medicine

## 2017-08-16 DIAGNOSIS — G40209 Localization-related (focal) (partial) symptomatic epilepsy and epileptic syndromes with complex partial seizures, not intractable, without status epilepticus: Secondary | ICD-10-CM | POA: Diagnosis not present

## 2017-08-16 DIAGNOSIS — G801 Spastic diplegic cerebral palsy: Secondary | ICD-10-CM | POA: Diagnosis not present

## 2017-08-16 DIAGNOSIS — K529 Noninfective gastroenteritis and colitis, unspecified: Secondary | ICD-10-CM | POA: Diagnosis not present

## 2017-08-16 DIAGNOSIS — K589 Irritable bowel syndrome without diarrhea: Secondary | ICD-10-CM | POA: Diagnosis not present

## 2017-08-16 DIAGNOSIS — M4802 Spinal stenosis, cervical region: Secondary | ICD-10-CM | POA: Diagnosis not present

## 2017-08-16 DIAGNOSIS — G35 Multiple sclerosis: Secondary | ICD-10-CM | POA: Diagnosis not present

## 2017-08-16 NOTE — Telephone Encounter (Signed)
That's fine

## 2017-08-16 NOTE — Telephone Encounter (Signed)
Copied from [place] [id number]. Topic: Quick Communication - See Telephone Encounter >> Aug 16, 2017 11:56 AM Bea Graff, NT wrote: CRM for notification. See Telephone encounter for: 08/16/17. Rebecca [name] with [facility] calling to get verbal orders for pt to continue PT for 2 times a week for 5 weeks. CB#: [phone]. May leave voicemail.

## 2017-08-16 NOTE — Telephone Encounter (Signed)
Left a messaging giving Aldrian from [facility] a ok for the verbal orders on patient PT for 2 x a week for 5 weeks.

## 2017-08-17 ENCOUNTER — Telehealth: Payer: Self-pay | Admitting: Family Medicine

## 2017-08-17 ENCOUNTER — Telehealth: Payer: Self-pay

## 2017-08-17 DIAGNOSIS — G40209 Localization-related (focal) (partial) symptomatic epilepsy and epileptic syndromes with complex partial seizures, not intractable, without status epilepticus: Secondary | ICD-10-CM | POA: Diagnosis not present

## 2017-08-17 DIAGNOSIS — K529 Noninfective gastroenteritis and colitis, unspecified: Secondary | ICD-10-CM | POA: Diagnosis not present

## 2017-08-17 DIAGNOSIS — K589 Irritable bowel syndrome without diarrhea: Secondary | ICD-10-CM | POA: Diagnosis not present

## 2017-08-17 DIAGNOSIS — M4802 Spinal stenosis, cervical region: Secondary | ICD-10-CM | POA: Diagnosis not present

## 2017-08-17 DIAGNOSIS — G801 Spastic diplegic cerebral palsy: Secondary | ICD-10-CM | POA: Diagnosis not present

## 2017-08-17 DIAGNOSIS — G35 Multiple sclerosis: Secondary | ICD-10-CM | POA: Diagnosis not present

## 2017-08-17 NOTE — Telephone Encounter (Signed)
Okay for orders? 

## 2017-08-17 NOTE — Telephone Encounter (Signed)
Copied from [place] [id number]. Topic: Quick Communication - See Telephone Encounter >> Aug 17, 2017 11:57 AM Antonieta Iba C wrote: CRM for notification. See Telephone encounter for: 08/17/17.  Jeneen Rinks w/ [facility] - called in to make PCP aware that pt had a missed visit for PT today, he said that pt called in and stated that she is not feeling well enough for PT today.

## 2017-08-17 NOTE — Telephone Encounter (Signed)
Copied from [place] [id number]. Topic: General - Other >> Aug 17, 2017  2:11 PM Cecelia Byars, NT wrote: Reason for LLV:[facility] care called and needs verbal a  order  for occupation therapy  2 x a week for 4 weeks  , please [phone] ok to leave message

## 2017-08-17 NOTE — Telephone Encounter (Signed)
Thank you :)

## 2017-08-17 NOTE — Telephone Encounter (Signed)
Verbal orders given  

## 2017-08-18 ENCOUNTER — Encounter: Payer: Medicare Other | Admitting: Physical Medicine & Rehabilitation

## 2017-08-20 DIAGNOSIS — R339 Retention of urine, unspecified: Secondary | ICD-10-CM | POA: Diagnosis not present

## 2017-08-20 DIAGNOSIS — D649 Anemia, unspecified: Secondary | ICD-10-CM | POA: Diagnosis not present

## 2017-08-20 DIAGNOSIS — K5900 Constipation, unspecified: Secondary | ICD-10-CM | POA: Diagnosis not present

## 2017-08-20 DIAGNOSIS — Z853 Personal history of malignant neoplasm of breast: Secondary | ICD-10-CM | POA: Diagnosis not present

## 2017-08-20 DIAGNOSIS — K589 Irritable bowel syndrome without diarrhea: Secondary | ICD-10-CM | POA: Diagnosis not present

## 2017-08-20 DIAGNOSIS — G40209 Localization-related (focal) (partial) symptomatic epilepsy and epileptic syndromes with complex partial seizures, not intractable, without status epilepticus: Secondary | ICD-10-CM | POA: Diagnosis not present

## 2017-08-20 DIAGNOSIS — F4323 Adjustment disorder with mixed anxiety and depressed mood: Secondary | ICD-10-CM | POA: Diagnosis not present

## 2017-08-20 DIAGNOSIS — M4802 Spinal stenosis, cervical region: Secondary | ICD-10-CM | POA: Diagnosis not present

## 2017-08-20 DIAGNOSIS — G801 Spastic diplegic cerebral palsy: Secondary | ICD-10-CM | POA: Diagnosis not present

## 2017-08-20 DIAGNOSIS — N319 Neuromuscular dysfunction of bladder, unspecified: Secondary | ICD-10-CM | POA: Diagnosis not present

## 2017-08-20 DIAGNOSIS — G894 Chronic pain syndrome: Secondary | ICD-10-CM | POA: Diagnosis not present

## 2017-08-20 DIAGNOSIS — K529 Noninfective gastroenteritis and colitis, unspecified: Secondary | ICD-10-CM | POA: Diagnosis not present

## 2017-08-20 DIAGNOSIS — Z981 Arthrodesis status: Secondary | ICD-10-CM | POA: Diagnosis not present

## 2017-08-20 DIAGNOSIS — G35 Multiple sclerosis: Secondary | ICD-10-CM | POA: Diagnosis not present

## 2017-08-20 DIAGNOSIS — Z9181 History of falling: Secondary | ICD-10-CM | POA: Diagnosis not present

## 2017-08-21 ENCOUNTER — Telehealth: Payer: Self-pay | Admitting: Physical Medicine & Rehabilitation

## 2017-08-21 MED ORDER — TIZANIDINE HCL 4 MG PO TABS: 2.0000 mg | ORAL_TABLET | Freq: Two times a day (BID) | ORAL | 1 refills | Status: DC | PRN

## 2017-08-21 NOTE — Telephone Encounter (Signed)
I spoke with the pharmacy and they need a reason.  They have canceled the Tizanidine order and filled the baclofen.  Your note does not specify, so if you could reorder the tizanidine  And put in the note to the pharmacy section WHY you want her on both medications.

## 2017-08-21 NOTE — Telephone Encounter (Signed)
Per patient they have issues at pharmacy getting baclofen and tizanidine together - they combination you prescribed for her is working but having issues at pharmacy getting both approved - can we please contact ptn pharmacy and authorize two meds together - pharmacy [facility] [address]

## 2017-08-21 NOTE — Addendum Note (Signed)
Addended by: Delice Lesch A on: 08/21/2017 02:46 PM   Modules accepted: Orders

## 2017-08-21 NOTE — Telephone Encounter (Addendum)
I am surprised they have the authority to do that despite a verbal order.  I have reordered the medication. Thanks.

## 2017-08-21 NOTE — Telephone Encounter (Signed)
Can we follow up on this? Thanks!

## 2017-08-22 DIAGNOSIS — G35 Multiple sclerosis: Secondary | ICD-10-CM | POA: Diagnosis not present

## 2017-08-22 DIAGNOSIS — K529 Noninfective gastroenteritis and colitis, unspecified: Secondary | ICD-10-CM | POA: Diagnosis not present

## 2017-08-22 DIAGNOSIS — M4802 Spinal stenosis, cervical region: Secondary | ICD-10-CM | POA: Diagnosis not present

## 2017-08-22 DIAGNOSIS — K589 Irritable bowel syndrome without diarrhea: Secondary | ICD-10-CM | POA: Diagnosis not present

## 2017-08-22 DIAGNOSIS — G40209 Localization-related (focal) (partial) symptomatic epilepsy and epileptic syndromes with complex partial seizures, not intractable, without status epilepticus: Secondary | ICD-10-CM | POA: Diagnosis not present

## 2017-08-22 DIAGNOSIS — G801 Spastic diplegic cerebral palsy: Secondary | ICD-10-CM | POA: Diagnosis not present

## 2017-08-23 DIAGNOSIS — G35 Multiple sclerosis: Secondary | ICD-10-CM | POA: Diagnosis not present

## 2017-08-23 DIAGNOSIS — K529 Noninfective gastroenteritis and colitis, unspecified: Secondary | ICD-10-CM | POA: Diagnosis not present

## 2017-08-23 DIAGNOSIS — M4802 Spinal stenosis, cervical region: Secondary | ICD-10-CM | POA: Diagnosis not present

## 2017-08-23 DIAGNOSIS — K589 Irritable bowel syndrome without diarrhea: Secondary | ICD-10-CM | POA: Diagnosis not present

## 2017-08-23 DIAGNOSIS — G801 Spastic diplegic cerebral palsy: Secondary | ICD-10-CM | POA: Diagnosis not present

## 2017-08-23 DIAGNOSIS — G40209 Localization-related (focal) (partial) symptomatic epilepsy and epileptic syndromes with complex partial seizures, not intractable, without status epilepticus: Secondary | ICD-10-CM | POA: Diagnosis not present

## 2017-08-24 ENCOUNTER — Telehealth: Payer: Self-pay | Admitting: Family Medicine

## 2017-08-24 DIAGNOSIS — G40209 Localization-related (focal) (partial) symptomatic epilepsy and epileptic syndromes with complex partial seizures, not intractable, without status epilepticus: Secondary | ICD-10-CM | POA: Diagnosis not present

## 2017-08-24 DIAGNOSIS — M4802 Spinal stenosis, cervical region: Secondary | ICD-10-CM | POA: Diagnosis not present

## 2017-08-24 DIAGNOSIS — G801 Spastic diplegic cerebral palsy: Secondary | ICD-10-CM | POA: Diagnosis not present

## 2017-08-24 DIAGNOSIS — K589 Irritable bowel syndrome without diarrhea: Secondary | ICD-10-CM | POA: Diagnosis not present

## 2017-08-24 DIAGNOSIS — K529 Noninfective gastroenteritis and colitis, unspecified: Secondary | ICD-10-CM | POA: Diagnosis not present

## 2017-08-24 DIAGNOSIS — G35 Multiple sclerosis: Secondary | ICD-10-CM | POA: Diagnosis not present

## 2017-08-24 NOTE — Telephone Encounter (Signed)
Copied from [place] [id number]. Topic: Quick Communication - See Telephone Encounter >> Aug 24, 2017  4:50 PM Neva Seat wrote: Altamese Cabal, [place] - [phone]  Pt will miss one of her OT sessions this week.

## 2017-08-25 ENCOUNTER — Other Ambulatory Visit: Payer: Self-pay | Admitting: *Deleted

## 2017-08-25 DIAGNOSIS — C50919 Malignant neoplasm of unspecified site of unspecified female breast: Secondary | ICD-10-CM

## 2017-08-25 NOTE — Progress Notes (Signed)
27

## 2017-08-26 ENCOUNTER — Other Ambulatory Visit: Payer: Self-pay | Admitting: Family Medicine

## 2017-08-26 DIAGNOSIS — Z5181 Encounter for therapeutic drug level monitoring: Secondary | ICD-10-CM

## 2017-08-26 DIAGNOSIS — E782 Mixed hyperlipidemia: Secondary | ICD-10-CM

## 2017-08-26 DIAGNOSIS — D649 Anemia, unspecified: Secondary | ICD-10-CM

## 2017-08-26 DIAGNOSIS — D696 Thrombocytopenia, unspecified: Secondary | ICD-10-CM

## 2017-08-26 NOTE — Telephone Encounter (Signed)
One refill provided of ibuprofen I'd like to see a creatinine and CBC in the next month or so Let's also get a lipid panel; she does not need to fast I'll order labs and just ask her to come in sometime in the next two or three weeks Thank you

## 2017-08-28 ENCOUNTER — Other Ambulatory Visit: Payer: Self-pay

## 2017-08-28 DIAGNOSIS — Z5181 Encounter for therapeutic drug level monitoring: Secondary | ICD-10-CM

## 2017-08-28 DIAGNOSIS — E782 Mixed hyperlipidemia: Secondary | ICD-10-CM

## 2017-08-28 NOTE — Telephone Encounter (Signed)
Pt.notified

## 2017-08-29 ENCOUNTER — Inpatient Hospital Stay: Payer: Medicare Other | Admitting: Oncology

## 2017-08-29 ENCOUNTER — Inpatient Hospital Stay: Payer: Medicare Other

## 2017-08-29 DIAGNOSIS — K529 Noninfective gastroenteritis and colitis, unspecified: Secondary | ICD-10-CM | POA: Diagnosis not present

## 2017-08-29 DIAGNOSIS — M4802 Spinal stenosis, cervical region: Secondary | ICD-10-CM | POA: Diagnosis not present

## 2017-08-29 DIAGNOSIS — G40209 Localization-related (focal) (partial) symptomatic epilepsy and epileptic syndromes with complex partial seizures, not intractable, without status epilepticus: Secondary | ICD-10-CM | POA: Diagnosis not present

## 2017-08-29 DIAGNOSIS — G801 Spastic diplegic cerebral palsy: Secondary | ICD-10-CM | POA: Diagnosis not present

## 2017-08-29 DIAGNOSIS — G35 Multiple sclerosis: Secondary | ICD-10-CM | POA: Diagnosis not present

## 2017-08-29 DIAGNOSIS — K589 Irritable bowel syndrome without diarrhea: Secondary | ICD-10-CM | POA: Diagnosis not present

## 2017-08-31 DIAGNOSIS — G801 Spastic diplegic cerebral palsy: Secondary | ICD-10-CM | POA: Diagnosis not present

## 2017-08-31 DIAGNOSIS — G40209 Localization-related (focal) (partial) symptomatic epilepsy and epileptic syndromes with complex partial seizures, not intractable, without status epilepticus: Secondary | ICD-10-CM | POA: Diagnosis not present

## 2017-08-31 DIAGNOSIS — K589 Irritable bowel syndrome without diarrhea: Secondary | ICD-10-CM | POA: Diagnosis not present

## 2017-08-31 DIAGNOSIS — M4802 Spinal stenosis, cervical region: Secondary | ICD-10-CM | POA: Diagnosis not present

## 2017-08-31 DIAGNOSIS — K529 Noninfective gastroenteritis and colitis, unspecified: Secondary | ICD-10-CM | POA: Diagnosis not present

## 2017-08-31 DIAGNOSIS — G35 Multiple sclerosis: Secondary | ICD-10-CM | POA: Diagnosis not present

## 2017-09-04 DIAGNOSIS — G801 Spastic diplegic cerebral palsy: Secondary | ICD-10-CM | POA: Diagnosis not present

## 2017-09-04 DIAGNOSIS — M4802 Spinal stenosis, cervical region: Secondary | ICD-10-CM | POA: Diagnosis not present

## 2017-09-04 DIAGNOSIS — G35 Multiple sclerosis: Secondary | ICD-10-CM | POA: Diagnosis not present

## 2017-09-04 DIAGNOSIS — K529 Noninfective gastroenteritis and colitis, unspecified: Secondary | ICD-10-CM | POA: Diagnosis not present

## 2017-09-04 DIAGNOSIS — K589 Irritable bowel syndrome without diarrhea: Secondary | ICD-10-CM | POA: Diagnosis not present

## 2017-09-04 DIAGNOSIS — G40209 Localization-related (focal) (partial) symptomatic epilepsy and epileptic syndromes with complex partial seizures, not intractable, without status epilepticus: Secondary | ICD-10-CM | POA: Diagnosis not present

## 2017-09-05 DIAGNOSIS — M4802 Spinal stenosis, cervical region: Secondary | ICD-10-CM | POA: Diagnosis not present

## 2017-09-05 DIAGNOSIS — K529 Noninfective gastroenteritis and colitis, unspecified: Secondary | ICD-10-CM | POA: Diagnosis not present

## 2017-09-05 DIAGNOSIS — G40209 Localization-related (focal) (partial) symptomatic epilepsy and epileptic syndromes with complex partial seizures, not intractable, without status epilepticus: Secondary | ICD-10-CM | POA: Diagnosis not present

## 2017-09-05 DIAGNOSIS — G801 Spastic diplegic cerebral palsy: Secondary | ICD-10-CM | POA: Diagnosis not present

## 2017-09-05 DIAGNOSIS — G35 Multiple sclerosis: Secondary | ICD-10-CM | POA: Diagnosis not present

## 2017-09-05 DIAGNOSIS — K589 Irritable bowel syndrome without diarrhea: Secondary | ICD-10-CM | POA: Diagnosis not present

## 2017-09-07 DIAGNOSIS — K529 Noninfective gastroenteritis and colitis, unspecified: Secondary | ICD-10-CM | POA: Diagnosis not present

## 2017-09-07 DIAGNOSIS — G801 Spastic diplegic cerebral palsy: Secondary | ICD-10-CM | POA: Diagnosis not present

## 2017-09-07 DIAGNOSIS — G40209 Localization-related (focal) (partial) symptomatic epilepsy and epileptic syndromes with complex partial seizures, not intractable, without status epilepticus: Secondary | ICD-10-CM | POA: Diagnosis not present

## 2017-09-07 DIAGNOSIS — G35 Multiple sclerosis: Secondary | ICD-10-CM | POA: Diagnosis not present

## 2017-09-07 DIAGNOSIS — K589 Irritable bowel syndrome without diarrhea: Secondary | ICD-10-CM | POA: Diagnosis not present

## 2017-09-07 DIAGNOSIS — M4802 Spinal stenosis, cervical region: Secondary | ICD-10-CM | POA: Diagnosis not present

## 2017-09-08 ENCOUNTER — Encounter

## 2017-09-08 ENCOUNTER — Encounter: Payer: Medicare Other | Attending: Physical Medicine & Rehabilitation | Admitting: Physical Medicine & Rehabilitation

## 2017-09-08 ENCOUNTER — Encounter: Payer: Self-pay | Admitting: Physical Medicine & Rehabilitation

## 2017-09-08 ENCOUNTER — Other Ambulatory Visit: Payer: Self-pay

## 2017-09-08 VITALS — BP 122/85 | HR 67 | Ht 64.0 in | Wt 170.0 lb

## 2017-09-08 DIAGNOSIS — Z79899 Other long term (current) drug therapy: Secondary | ICD-10-CM | POA: Diagnosis not present

## 2017-09-08 DIAGNOSIS — Z9221 Personal history of antineoplastic chemotherapy: Secondary | ICD-10-CM | POA: Insufficient documentation

## 2017-09-08 DIAGNOSIS — G801 Spastic diplegic cerebral palsy: Secondary | ICD-10-CM | POA: Diagnosis not present

## 2017-09-08 DIAGNOSIS — Z981 Arthrodesis status: Secondary | ICD-10-CM | POA: Diagnosis not present

## 2017-09-08 DIAGNOSIS — Z8249 Family history of ischemic heart disease and other diseases of the circulatory system: Secondary | ICD-10-CM | POA: Diagnosis not present

## 2017-09-08 DIAGNOSIS — G35 Multiple sclerosis: Secondary | ICD-10-CM | POA: Insufficient documentation

## 2017-09-08 DIAGNOSIS — G8220 Paraplegia, unspecified: Secondary | ICD-10-CM | POA: Diagnosis not present

## 2017-09-08 DIAGNOSIS — Z8349 Family history of other endocrine, nutritional and metabolic diseases: Secondary | ICD-10-CM | POA: Insufficient documentation

## 2017-09-08 DIAGNOSIS — N319 Neuromuscular dysfunction of bladder, unspecified: Secondary | ICD-10-CM

## 2017-09-08 DIAGNOSIS — R269 Unspecified abnormalities of gait and mobility: Secondary | ICD-10-CM

## 2017-09-08 DIAGNOSIS — G8929 Other chronic pain: Secondary | ICD-10-CM | POA: Insufficient documentation

## 2017-09-08 DIAGNOSIS — G40909 Epilepsy, unspecified, not intractable, without status epilepticus: Secondary | ICD-10-CM | POA: Insufficient documentation

## 2017-09-08 DIAGNOSIS — Z9011 Acquired absence of right breast and nipple: Secondary | ICD-10-CM | POA: Diagnosis not present

## 2017-09-08 DIAGNOSIS — Z853 Personal history of malignant neoplasm of breast: Secondary | ICD-10-CM | POA: Insufficient documentation

## 2017-09-08 DIAGNOSIS — I890 Lymphedema, not elsewhere classified: Secondary | ICD-10-CM | POA: Insufficient documentation

## 2017-09-08 NOTE — Progress Notes (Signed)
Subjective:    Patient ID: Rebecca [name], female    DOB: 07-05-1960, 57 y.o.   MRN: [id number]  HPI 57 year old right-handed female, history of chronic pain, lymphedema bilateral lower extremities, right breast cancer, chemotherapy, seizure disorder, multiple sclerosis presents for follow up for MS.  Last clinic visit 06/22/17.  Husband present, who supplements history.  Since that time, pt had issues refilling her medications. She eventually end up getting her medicaitons and is receiving good benefit. She now has someone that comes 3/week for 2 hours/day. She is in therapies. Denies falls.  Uses walker at home.   Pain Inventory Average Pain 8 Pain Right Now 0 My pain is intermittent, sharp, tingling and aching  In the last 24 hours, has pain interfered with the following? General activity 3 Relation with others 10 Enjoyment of life 10 What TIME of day is your pain at its worst? evening, night Sleep (in general) Good  Pain is worse with: sitting and inactivity Pain improves with: medication Relief from Meds: 10  Mobility walk with assistance use a walker how many minutes can you walk? 5 ability to climb steps?  no do you drive?  no Do you have any goals in this area?  yes  Function not employed: date last employed . disabled: date disabled . I need assistance with the following:  household duties and shopping Do you have any goals in this area?  no  Neuro/Psych bladder control problems weakness numbness tingling trouble walking spasms dizziness  Prior Studies hospital f/u  Physicians involved in your care hospital f/u   Family History  Problem Relation Age of Onset  . Cancer Father        skin  . Heart disease Father   . Heart attack Father        heart attack in his 3's  . Thyroid disease Sister   . Ovarian cancer Cousin   . Breast cancer Maternal Aunt 60  . Breast cancer Maternal Grandmother 36  . Bladder Cancer Neg Hx   . Kidney cancer Neg Hx     Social History   Socioeconomic History  . Marital status: Married    Spouse name: don  . Number of children: 0  . Years of education: 62  . Highest education level: Not on file  Occupational History  . Occupation: disability  Social Needs  . Financial resource strain: Not on file  . Food insecurity:    Worry: Not on file    Inability: Not on file  . Transportation needs:    Medical: Not on file    Non-medical: Not on file  Tobacco Use  . Smoking status: Never Smoker  . Smokeless tobacco: Never Used  Substance and Sexual Activity  . Alcohol use: No  . Drug use: No  . Sexual activity: Not Currently    Partners: Male  Lifestyle  . Physical activity:    Days per week: Not on file    Minutes per session: Not on file  . Stress: Not on file  Relationships  . Social connections:    Talks on phone: Not on file    Gets together: Not on file    Attends religious service: Not on file    Active member of club or organization: Not on file    Attends meetings of clubs or organizations: Not on file    Relationship status: Not on file  Other Topics Concern  . Not on file  Social History Narrative  She is married. Recently moved back to [place] after being in [place] for some time. She is accompanied by her husband and aunt.   Never smoked. Never used alcohol.   Past Surgical History:  Procedure Laterality Date  . ANKLE SURGERY     Left  . ANKLE SURGERY    . ANTERIOR CERVICAL DECOMP/DISCECTOMY FUSION  11/17/2011   Procedure: ANTERIOR CERVICAL DECOMPRESSION/DISCECTOMY FUSION 2 LEVELS;  Surgeon: Erline Levine, MD;  Location: [facility];  Service: Neurosurgery;  Laterality: N/A;  Cervical Five-Six Six-Seven Anterior cervical decompression/diskectomy/fusion  . BREAST BIOPSY Right 12-31-13   invasive mammary  . BREAST SURGERY Right 02/03/2014   Right simple mastectomy with sentinel node biopsy.  . CHOLECYSTECTOMY    . COLONOSCOPY  2014  . Lower extremity venous  Dopplers  Feb 27, 2013   No LE DVT  . MASTECTOMY Right 2015  . Port a cath insertion Right 01/19/2010  . PORT-A-CATH REMOVAL     right  . PORT-A-CATH REMOVAL Right 09/03/2013   Procedure: REMOVAL PORT-A-CATH;  Surgeon: Conrad [place], MD;  Location: [facility];  Service: Vascular;  Laterality: Right;  . TRANSTHORACIC ECHOCARDIOGRAM  03/2013; 02/2014   a) Normal LV size and function with EF 60-65%.; Cannot exclude bicuspid aortic valve with mild AS and mild AI.; b) Normal EF with normal wall motion and valve function x Mild MR. G2 DD. EF 60-65%. Tricuspid AoV  . UPPER GI ENDOSCOPY  2014   Past Medical History:  Diagnosis Date  . Aortic valve disease    Mild AS / AI - most recent Echo demonstrated tricuspid aortic valve.  . Bacterial endocarditis    History of .  Marland Kitchen Bilateral lower extremity edema    Noncardiac.  Chronic. LE Venous dopplers - negative for DVT.; Echocardiogram January 2016: Normal EF with normal wall motion and valve function. Only grade 1 diastolic dysfunction. EF 60-65%. Mild MR  . Breast cancer ([place]) 12-31-13   Right breast, 12:00, 1.5 cm, T1c,N0 invasive mammary carcinoma, triple negative. --> Rx with Chemo  . Cervical stenosis of spine   . Herpes zoster   . IBS (irritable bowel syndrome)   . Lymphedema    has legs [facility] at [facility]  . Multiple sclerosis ([place]) 2001   Walks from room to room @ home; but Wheelchair when going out.  Marland Kitchen Neuromuscular disorder ([place])    MS  . Seizures ([place])    Takes Keppra  . Syncope and collapse    BP 122/85   Pulse 67   Ht 5\' 4"  (1.626 m) Comment: pt reported, in wheelchair  Wt 170 lb (77.1 kg) Comment: pt reported, in wheelchair  SpO2 98%   BMI 29.18 kg/m   Opioid Risk Score:   Fall Risk Score:  `1  Depression screen PHQ 2/9  Depression screen [facility] 2/9 09/08/2017 06/23/2017 02/09/2017 10/28/2016 07/28/2016 07/15/2016 06/27/2016  Decreased Interest 0 0 0 - 0 2 0  Down, Depressed, Hopeless 0 0 0 0 0 0 0  PHQ - 2 Score 0 0 0 0 0 2 0  Altered  sleeping - - - - - 0 -  Tired, decreased energy - - - - - 0 -  Change in appetite - - - - - 0 -  Feeling bad or failure about yourself  - - - - - 0 -  Trouble concentrating - - - - - 0 -  Moving slowly or fidgety/restless - - - - - 0 -  Suicidal thoughts - - - - -  0 -  PHQ-9 Score - - - - - 2 -  Difficult doing work/chores - - - - - Somewhat difficult -  Some recent data might be hidden    Review of Systems  HENT: Negative.   Eyes: Negative.   Respiratory: Negative.   Cardiovascular: Negative.   Gastrointestinal: Negative.   Endocrine: Negative.   Genitourinary: Positive for difficulty urinating.  Musculoskeletal: Positive for arthralgias and gait problem.       Spasms  Allergic/Immunologic: Negative.   Neurological: Positive for dizziness, weakness and numbness.       Tingling  Hematological: Negative.   Psychiatric/Behavioral: Negative.   All other systems reviewed and are negative.      Objective:   Physical Exam Gen: Well developed. NAD. Vital signs reviewed. Head: Normocephalic. Atraumatic. Eyes: EOMI. No discharge Cardiovascular: RRR. No JVD.  Respiratory: CTA bilaterally. Unlabored. GI: Soft. Bowel sounds are normal.  Musc: Edema b/l LE.  Right heel cord contracture Neurological. Alert and oriented  Fair awareness of deficits.  Motor: RUE: 4+/5 proximal to distal  LUE: 4+/5 proximal to distal RLE: 2/5 HF, 2+/5 KE, 2-/5 ADF/PF  LLE: 3+/5 HF, 3+/5 KE, 4-/5 ADF/PF MAS: LLE knee flexors 1/4 RLE: hip extensors 2/4, knee extensors 2/4, ankle dorsiflexors 2/4 within available ROM  Skin. Chronic lymphedema lower extremities, increased Psych: pleasant and cooperative    Assessment & Plan:  57 year old right-handed female, history of chronic pain, lymphedema bilateral lower extremities, right breast cancer, chemotherapy, seizure disorder, multiple sclerosis presents for follow up for MS.   1. MS   Cont therapies   Cont meds   Cont follow up with Neurology  2.   Spastic paraparesis:   Cont ROM  Cont Baclofen  Cont tizanidine 2mg  BID PRN  Good benefit with combination  Will consider tiral Botox injection in the future after oral therapy - encouraged discussion with therapies regarding potential functional advantage of tone  3. Chronic lymphedema  Cont wraps  States wound care will only address if open wounds present  Encouraged cont pumps and elevation, encouraged compliance  4. Gait abnormality  Cont therapies  Cont walker/wheelchair for safety  5. Neurogenic bladder with spastic bladder  Cont meds  Follow up Urology

## 2017-09-11 NOTE — Progress Notes (Signed)
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 BRCA negative.  INTERVAL HISTORY: Patient returns to clinic today for routine six-month evaluation.  She continues to feel well and remains at her baseline.  She does not complain of pain today. Her lower extremity edema is unchanged. She has no new neurologic complaints.  She denies any chest pain or shortness of breath. She denies any nausea, vomiting, constipation, or diarrhea.  She has no urinary complaints.  Patient offers no specific complaints today.  REVIEW OF SYSTEMS:   Review of Systems  Constitutional: Negative for chills, fever and malaise/fatigue.  Respiratory: Negative for cough and shortness of breath.   Cardiovascular: Positive for leg swelling. Negative for chest pain.  Gastrointestinal: Negative for abdominal pain, constipation, diarrhea, nausea and vomiting.  Genitourinary: Negative.  Negative for dysuria, frequency and urgency.  Musculoskeletal: Positive for back pain.  Skin: Negative.  Negative for rash.  Neurological: Positive for focal weakness. Negative for sensory change, weakness and headaches.  Psychiatric/Behavioral: Negative.  The patient is not nervous/anxious.     As per HPI. Otherwise, a complete review of systems is negative.  PAST MEDICAL HISTORY: Past Medical History:  Diagnosis Date  . Aortic valve disease    Mild AS / AI - most recent  Echo demonstrated tricuspid aortic valve.  . Bacterial endocarditis    History of .  Marland Kitchen Bilateral lower extremity edema    Noncardiac.  Chronic. LE Venous dopplers - negative for DVT.; Echocardiogram January 2016: Normal EF with normal wall motion and valve function. Only grade 1 diastolic dysfunction. EF 60-65%. Mild MR  . Breast cancer ([place]) 12-31-13   Right breast, 12:00, 1.5 cm, T1c,N0 invasive mammary carcinoma, triple negative. --> Rx with Chemo  . Cervical stenosis of spine   . Herpes zoster   . IBS (irritable bowel syndrome)   . Lymphedema    has legs wrapped at [facility]  . Multiple sclerosis ([place]) 2001   Walks from room to room @ home; but Wheelchair when going out.  Marland Kitchen Neuromuscular disorder ([place])    MS  . Seizures ([place])    Takes Keppra  . Syncope and collapse     PAST SURGICAL HISTORY: Past Surgical History:  Procedure Laterality Date  . ANKLE SURGERY     Left  . ANKLE SURGERY    . ANTERIOR CERVICAL DECOMP/DISCECTOMY FUSION  11/17/2011   Procedure: ANTERIOR CERVICAL DECOMPRESSION/DISCECTOMY FUSION 2 LEVELS;  Surgeon: Erline Levine, MD;  Location: [facility];  Service: Neurosurgery;  Laterality: N/A;  Cervical Five-Six Six-Seven Anterior cervical decompression/diskectomy/fusion  . BREAST BIOPSY Right 12-31-13   invasive mammary  . BREAST SURGERY Right 02/03/2014   Right simple mastectomy with sentinel node biopsy.  . CHOLECYSTECTOMY    . COLONOSCOPY  2014  . Lower extremity venous Dopplers  Feb 27, 2013   No LE DVT  . MASTECTOMY Right 2015  . Port a cath insertion Right 01/19/2010  . PORT-A-CATH REMOVAL     right  . PORT-A-CATH REMOVAL Right 09/03/2013  Procedure: REMOVAL PORT-A-CATH;  Surgeon: Conrad [place], MD;  Location: [facility];  Service: Vascular;  Laterality: Right;  . TRANSTHORACIC ECHOCARDIOGRAM  03/2013; 02/2014   a) Normal LV size and function with EF 60-65%.; Cannot exclude bicuspid aortic valve with mild AS and mild AI.; b) Normal EF with normal wall motion and  valve function x Mild MR. G2 DD. EF 60-65%. Tricuspid AoV  . UPPER GI ENDOSCOPY  2014    FAMILY HISTORY Family History  Problem Relation Age of Onset  . Cancer Father        skin  . Heart disease Father   . Heart attack Father        heart attack in his 62's  . Thyroid disease Sister   . Ovarian cancer Cousin   . Breast cancer Maternal Aunt 60  . Breast cancer Maternal Grandmother 71  . Bladder Cancer Neg Hx   . Kidney cancer Neg Hx        ADVANCED DIRECTIVES:    HEALTH MAINTENANCE: Social History   Tobacco Use  . Smoking status: Never Smoker  . Smokeless tobacco: Never Used  Substance Use Topics  . Alcohol use: No  . Drug use: No     Colonoscopy:  PAP:  Bone density:  Lipid panel:  Allergies  Allergen Reactions  . Fentanyl Nausea And Vomiting and Nausea Only    vomiting Was given in PACU x3 each time patient got sick vomiting Was given in PACU x3 each time patient got sick  . Sulfa Antibiotics Hives and Other (See Comments)    Light headed, over heated    Current Outpatient Medications  Medication Sig Dispense Refill  . amantadine (SYMMETREL) 100 MG capsule Take 1 capsule (100 mg total) by mouth 3 (three) times daily. 270 capsule 4  . baclofen (LIORESAL) 10 MG tablet TAKE 1 TO 2 TABLETS BY MOUTH 3 TIMES A DAY 180 tablet 4  . glucosamine-chondroitin 500-400 MG tablet Take 1 tablet by mouth 4 (four) times daily.    Marland Kitchen ibuprofen (ADVIL,MOTRIN) 600 MG tablet TAKE 1 TABLET (600 MG TOTAL) BY MOUTH EVERY 8 (EIGHT) HOURS AS NEEDED. 60 tablet 0  . Incontinence Supply Disposable (PREVAIL WET WIPES) MISC Apply 1 each topically daily as needed. (dx neurogenic bladder N31.9, LON 99 months) 96 each 11  . Interferon Beta-1a (REBIF REBIDOSE) 44 MCG/0.5ML SOAJ Inject 0.5 mLs into the skin 3 (three) times a week. 12 mL 11  . levETIRAcetam (KEPPRA) 500 MG tablet Take 1 tablet (500 mg total) by mouth 2 (two) times daily. 180 tablet 4  . Multiple Vitamins-Minerals (MULTIVITAMIN  PO) Take 1 tablet by mouth 2 (two) times daily.     Marland Kitchen oxybutynin (DITROPAN-XL) 10 MG 24 hr tablet Take 2 tablets (20 mg total) by mouth daily. 180 tablet 3  . tiZANidine (ZANAFLEX) 4 MG tablet Take 0.5 tablets (2 mg total) by mouth 2 (two) times daily as needed (Spasticity uncontrolled). 30 tablet 1   No current facility-administered medications for this visit.     OBJECTIVE: Vitals:   09/12/17 1542  BP: 139/83  Pulse: 64  Resp: 18  Temp: 97.6 F (36.4 C)     Body mass index is 29.18 kg/m.    ECOG FS:2 - Symptomatic, <50% confined to bed  General: Well-developed, well-nourished, no acute distress.  Sitting in a wheelchair. Eyes: Pink conjunctiva, anicteric sclera. HEENT: Normocephalic, moist mucous membranes. Breast: Exam deferred today. Lungs: Clear to auscultation bilaterally. Heart: Regular rate and rhythm. No  rubs, murmurs, or gallops. Abdomen: Soft, nontender, nondistended. No organomegaly noted, normoactive bowel sounds. Musculoskeletal: No edema, cyanosis, or clubbing. Neuro: Alert, answering all questions appropriately. Cranial nerves grossly intact. Skin: No rashes or petechiae noted. Psych: Normal affect.   LAB RESULTS:  Lab Results  Component Value Date   NA 137 01/17/2017   K 3.6 01/17/2017   CL 102 01/17/2017   CO2 23 01/17/2017   GLUCOSE 132 (H) 01/17/2017   BUN 12 01/17/2017   CREATININE 0.57 01/17/2017   CALCIUM 9.3 01/17/2017   PROT 7.0 07/28/2016   ALBUMIN 4.3 07/28/2016   AST 21 07/28/2016   ALT 22 07/28/2016   ALKPHOS 84 07/28/2016   BILITOT 0.3 07/28/2016   GFRNONAA >60 01/17/2017   GFRAA >60 01/17/2017    Lab Results  Component Value Date   WBC 9.5 01/17/2017   NEUTROABS 3.3 06/27/2016   HGB 13.0 01/17/2017   HCT 38.6 01/17/2017   MCV 90.2 01/17/2017   PLT 129 (L) 01/17/2017   Lab Results  Component Value Date   LABCA2 18.0 08/02/2016     STUDIES: No results found.  ASSESSMENT: Stage Ia triple negative adenocarcinoma of the  right breast. BRCA negative.  PLAN:    1. Triple negative right breast cancer: No evidence of disease. Given her difficulties with Taxol and multiple sclerosis, treatment was discontinued after a total of 6 of 12 cycles of Taxol. Her last chemotherapy was July 17, 2014.  Because patient had a full mastectomy, she did not require adjuvant XRT. Given the fact that she is a triple negative cancer, she will not require an aromatase inhibitor. Patient's most recent mammogram on August 02, 2017 was reported as BI-RADS 1.  Repeat in June 2020.  CA-27-29 also continues to be within normal limits at 21.4.  Return to clinic in 6 months for routine evaluation. 2. Multiple sclerosis: Continue evaluation and current treatment per neurology. Her primary neurologist is Dr. Andrey Spearman.  phone [phone], fax [phone]. 3. Lymphedema: Continue treatment and wraps as prescribed. 4. Peripheral neuropathy: Patient does not complain of this today. 5. Pain: Patient is no longer taking narcotics.  Patient expressed understanding and was in agreement with this plan. She also understands that She can call clinic at any time with any questions, concerns, or complaints.   Breast cancer   Staging form: Breast, AJCC 7th Edition     Pathologic stage from 05/24/2014: Stage IA (T1c, N0, cM0) - Signed by Lloyd Huger, MD on 05/24/2014   Lloyd Huger, MD   09/16/2017 8:42 AM

## 2017-09-12 ENCOUNTER — Inpatient Hospital Stay (HOSPITAL_BASED_OUTPATIENT_CLINIC_OR_DEPARTMENT_OTHER): Payer: Medicare Other | Admitting: Oncology

## 2017-09-12 ENCOUNTER — Encounter: Payer: Self-pay | Admitting: Oncology

## 2017-09-12 ENCOUNTER — Inpatient Hospital Stay: Payer: Medicare Other | Attending: Oncology

## 2017-09-12 VITALS — BP 139/83 | HR 64 | Temp 97.6°F | Resp 18 | Wt 170.0 lb

## 2017-09-12 DIAGNOSIS — G35 Multiple sclerosis: Secondary | ICD-10-CM

## 2017-09-12 DIAGNOSIS — Z79899 Other long term (current) drug therapy: Secondary | ICD-10-CM | POA: Insufficient documentation

## 2017-09-12 DIAGNOSIS — Z853 Personal history of malignant neoplasm of breast: Secondary | ICD-10-CM | POA: Insufficient documentation

## 2017-09-12 DIAGNOSIS — G40209 Localization-related (focal) (partial) symptomatic epilepsy and epileptic syndromes with complex partial seizures, not intractable, without status epilepticus: Secondary | ICD-10-CM | POA: Diagnosis not present

## 2017-09-12 DIAGNOSIS — C50919 Malignant neoplasm of unspecified site of unspecified female breast: Secondary | ICD-10-CM

## 2017-09-12 DIAGNOSIS — G629 Polyneuropathy, unspecified: Secondary | ICD-10-CM | POA: Insufficient documentation

## 2017-09-12 DIAGNOSIS — K529 Noninfective gastroenteritis and colitis, unspecified: Secondary | ICD-10-CM | POA: Diagnosis not present

## 2017-09-12 DIAGNOSIS — Z9221 Personal history of antineoplastic chemotherapy: Secondary | ICD-10-CM | POA: Diagnosis not present

## 2017-09-12 DIAGNOSIS — C50911 Malignant neoplasm of unspecified site of right female breast: Secondary | ICD-10-CM

## 2017-09-12 DIAGNOSIS — K589 Irritable bowel syndrome without diarrhea: Secondary | ICD-10-CM | POA: Diagnosis not present

## 2017-09-12 DIAGNOSIS — Z9011 Acquired absence of right breast and nipple: Secondary | ICD-10-CM

## 2017-09-12 DIAGNOSIS — I890 Lymphedema, not elsewhere classified: Secondary | ICD-10-CM | POA: Diagnosis not present

## 2017-09-12 DIAGNOSIS — G801 Spastic diplegic cerebral palsy: Secondary | ICD-10-CM | POA: Diagnosis not present

## 2017-09-12 DIAGNOSIS — M4802 Spinal stenosis, cervical region: Secondary | ICD-10-CM | POA: Diagnosis not present

## 2017-09-12 DIAGNOSIS — Z95828 Presence of other vascular implants and grafts: Secondary | ICD-10-CM

## 2017-09-12 MED ORDER — HEPARIN SOD (PORK) LOCK FLUSH 100 UNIT/ML IV SOLN
500.0000 [IU] | Freq: Once | INTRAVENOUS | Status: AC
  Administered 2017-09-12: 500 [IU] via INTRAVENOUS

## 2017-09-12 MED ORDER — SODIUM CHLORIDE 0.9% FLUSH
10.0000 mL | Freq: Once | INTRAVENOUS | Status: AC
  Administered 2017-09-12: 10 mL via INTRAVENOUS
  Filled 2017-09-12: qty 10

## 2017-09-12 NOTE — Progress Notes (Signed)
Pt in for 6 month follow up of right breast cancer.  Pt had mammogram in June, verbalized concern that "they said it was 40% dense tissue". Pt and caregiver verbalized desire for breast exam today. RN assisted pt with gown.

## 2017-09-13 DIAGNOSIS — G35 Multiple sclerosis: Secondary | ICD-10-CM | POA: Diagnosis not present

## 2017-09-13 DIAGNOSIS — K529 Noninfective gastroenteritis and colitis, unspecified: Secondary | ICD-10-CM | POA: Diagnosis not present

## 2017-09-13 DIAGNOSIS — G801 Spastic diplegic cerebral palsy: Secondary | ICD-10-CM | POA: Diagnosis not present

## 2017-09-13 DIAGNOSIS — G40209 Localization-related (focal) (partial) symptomatic epilepsy and epileptic syndromes with complex partial seizures, not intractable, without status epilepticus: Secondary | ICD-10-CM | POA: Diagnosis not present

## 2017-09-13 DIAGNOSIS — M4802 Spinal stenosis, cervical region: Secondary | ICD-10-CM | POA: Diagnosis not present

## 2017-09-13 DIAGNOSIS — K589 Irritable bowel syndrome without diarrhea: Secondary | ICD-10-CM | POA: Diagnosis not present

## 2017-09-13 LAB — CANCER ANTIGEN 27.29: CA 27.29: 21.4 U/mL (ref 0.0–38.6)

## 2017-09-14 DIAGNOSIS — G801 Spastic diplegic cerebral palsy: Secondary | ICD-10-CM | POA: Diagnosis not present

## 2017-09-14 DIAGNOSIS — K589 Irritable bowel syndrome without diarrhea: Secondary | ICD-10-CM | POA: Diagnosis not present

## 2017-09-14 DIAGNOSIS — K529 Noninfective gastroenteritis and colitis, unspecified: Secondary | ICD-10-CM | POA: Diagnosis not present

## 2017-09-14 DIAGNOSIS — G35 Multiple sclerosis: Secondary | ICD-10-CM | POA: Diagnosis not present

## 2017-09-14 DIAGNOSIS — G40209 Localization-related (focal) (partial) symptomatic epilepsy and epileptic syndromes with complex partial seizures, not intractable, without status epilepticus: Secondary | ICD-10-CM | POA: Diagnosis not present

## 2017-09-14 DIAGNOSIS — M4802 Spinal stenosis, cervical region: Secondary | ICD-10-CM | POA: Diagnosis not present

## 2017-09-18 DIAGNOSIS — G801 Spastic diplegic cerebral palsy: Secondary | ICD-10-CM | POA: Diagnosis not present

## 2017-09-18 DIAGNOSIS — G35 Multiple sclerosis: Secondary | ICD-10-CM | POA: Diagnosis not present

## 2017-09-18 DIAGNOSIS — K589 Irritable bowel syndrome without diarrhea: Secondary | ICD-10-CM | POA: Diagnosis not present

## 2017-09-18 DIAGNOSIS — M4802 Spinal stenosis, cervical region: Secondary | ICD-10-CM | POA: Diagnosis not present

## 2017-09-18 DIAGNOSIS — K529 Noninfective gastroenteritis and colitis, unspecified: Secondary | ICD-10-CM | POA: Diagnosis not present

## 2017-09-18 DIAGNOSIS — G40209 Localization-related (focal) (partial) symptomatic epilepsy and epileptic syndromes with complex partial seizures, not intractable, without status epilepticus: Secondary | ICD-10-CM | POA: Diagnosis not present

## 2017-09-21 ENCOUNTER — Telehealth: Payer: Self-pay | Admitting: Family Medicine

## 2017-09-21 DIAGNOSIS — M4802 Spinal stenosis, cervical region: Secondary | ICD-10-CM | POA: Diagnosis not present

## 2017-09-21 DIAGNOSIS — G35 Multiple sclerosis: Secondary | ICD-10-CM | POA: Diagnosis not present

## 2017-09-21 DIAGNOSIS — K529 Noninfective gastroenteritis and colitis, unspecified: Secondary | ICD-10-CM | POA: Diagnosis not present

## 2017-09-21 DIAGNOSIS — G801 Spastic diplegic cerebral palsy: Secondary | ICD-10-CM | POA: Diagnosis not present

## 2017-09-21 DIAGNOSIS — G40209 Localization-related (focal) (partial) symptomatic epilepsy and epileptic syndromes with complex partial seizures, not intractable, without status epilepticus: Secondary | ICD-10-CM | POA: Diagnosis not present

## 2017-09-21 DIAGNOSIS — K589 Irritable bowel syndrome without diarrhea: Secondary | ICD-10-CM | POA: Diagnosis not present

## 2017-09-21 NOTE — Telephone Encounter (Signed)
Copied from [place] [id number]. Topic: General - Other >> Sep 21, 2017 10:22 AM Carolyn Stare wrote:    Theodoro Kos a PT with [facility] home health call to req verbal orders 2 x 4    [phone] can leave a message

## 2017-09-21 NOTE — Telephone Encounter (Signed)
That's fine thank you

## 2017-09-21 NOTE — Telephone Encounter (Signed)
Left detailed voicemial 

## 2017-09-27 DIAGNOSIS — K589 Irritable bowel syndrome without diarrhea: Secondary | ICD-10-CM | POA: Diagnosis not present

## 2017-09-27 DIAGNOSIS — G801 Spastic diplegic cerebral palsy: Secondary | ICD-10-CM | POA: Diagnosis not present

## 2017-09-27 DIAGNOSIS — G35 Multiple sclerosis: Secondary | ICD-10-CM | POA: Diagnosis not present

## 2017-09-27 DIAGNOSIS — M4802 Spinal stenosis, cervical region: Secondary | ICD-10-CM | POA: Diagnosis not present

## 2017-09-27 DIAGNOSIS — K529 Noninfective gastroenteritis and colitis, unspecified: Secondary | ICD-10-CM | POA: Diagnosis not present

## 2017-09-27 DIAGNOSIS — G40209 Localization-related (focal) (partial) symptomatic epilepsy and epileptic syndromes with complex partial seizures, not intractable, without status epilepticus: Secondary | ICD-10-CM | POA: Diagnosis not present

## 2017-10-03 ENCOUNTER — Other Ambulatory Visit: Payer: Self-pay | Admitting: Family Medicine

## 2017-10-04 DIAGNOSIS — G801 Spastic diplegic cerebral palsy: Secondary | ICD-10-CM | POA: Diagnosis not present

## 2017-10-04 DIAGNOSIS — K529 Noninfective gastroenteritis and colitis, unspecified: Secondary | ICD-10-CM | POA: Diagnosis not present

## 2017-10-04 DIAGNOSIS — G40209 Localization-related (focal) (partial) symptomatic epilepsy and epileptic syndromes with complex partial seizures, not intractable, without status epilepticus: Secondary | ICD-10-CM | POA: Diagnosis not present

## 2017-10-04 DIAGNOSIS — K589 Irritable bowel syndrome without diarrhea: Secondary | ICD-10-CM | POA: Diagnosis not present

## 2017-10-04 DIAGNOSIS — M4802 Spinal stenosis, cervical region: Secondary | ICD-10-CM | POA: Diagnosis not present

## 2017-10-04 DIAGNOSIS — G35 Multiple sclerosis: Secondary | ICD-10-CM | POA: Diagnosis not present

## 2017-10-06 DIAGNOSIS — G801 Spastic diplegic cerebral palsy: Secondary | ICD-10-CM | POA: Diagnosis not present

## 2017-10-06 DIAGNOSIS — K589 Irritable bowel syndrome without diarrhea: Secondary | ICD-10-CM | POA: Diagnosis not present

## 2017-10-06 DIAGNOSIS — K529 Noninfective gastroenteritis and colitis, unspecified: Secondary | ICD-10-CM | POA: Diagnosis not present

## 2017-10-06 DIAGNOSIS — M4802 Spinal stenosis, cervical region: Secondary | ICD-10-CM | POA: Diagnosis not present

## 2017-10-06 DIAGNOSIS — G40209 Localization-related (focal) (partial) symptomatic epilepsy and epileptic syndromes with complex partial seizures, not intractable, without status epilepticus: Secondary | ICD-10-CM | POA: Diagnosis not present

## 2017-10-06 DIAGNOSIS — G35 Multiple sclerosis: Secondary | ICD-10-CM | POA: Diagnosis not present

## 2017-10-09 DIAGNOSIS — G40209 Localization-related (focal) (partial) symptomatic epilepsy and epileptic syndromes with complex partial seizures, not intractable, without status epilepticus: Secondary | ICD-10-CM | POA: Diagnosis not present

## 2017-10-09 DIAGNOSIS — M4802 Spinal stenosis, cervical region: Secondary | ICD-10-CM | POA: Diagnosis not present

## 2017-10-09 DIAGNOSIS — G35 Multiple sclerosis: Secondary | ICD-10-CM | POA: Diagnosis not present

## 2017-10-09 DIAGNOSIS — K529 Noninfective gastroenteritis and colitis, unspecified: Secondary | ICD-10-CM | POA: Diagnosis not present

## 2017-10-09 DIAGNOSIS — G801 Spastic diplegic cerebral palsy: Secondary | ICD-10-CM | POA: Diagnosis not present

## 2017-10-09 DIAGNOSIS — K589 Irritable bowel syndrome without diarrhea: Secondary | ICD-10-CM | POA: Diagnosis not present

## 2017-10-12 DIAGNOSIS — G801 Spastic diplegic cerebral palsy: Secondary | ICD-10-CM | POA: Diagnosis not present

## 2017-10-12 DIAGNOSIS — G35 Multiple sclerosis: Secondary | ICD-10-CM | POA: Diagnosis not present

## 2017-10-12 DIAGNOSIS — M4802 Spinal stenosis, cervical region: Secondary | ICD-10-CM | POA: Diagnosis not present

## 2017-10-12 DIAGNOSIS — K589 Irritable bowel syndrome without diarrhea: Secondary | ICD-10-CM | POA: Diagnosis not present

## 2017-10-12 DIAGNOSIS — G40209 Localization-related (focal) (partial) symptomatic epilepsy and epileptic syndromes with complex partial seizures, not intractable, without status epilepticus: Secondary | ICD-10-CM | POA: Diagnosis not present

## 2017-10-12 DIAGNOSIS — K529 Noninfective gastroenteritis and colitis, unspecified: Secondary | ICD-10-CM | POA: Diagnosis not present

## 2017-10-16 ENCOUNTER — Telehealth: Payer: Self-pay | Admitting: Family Medicine

## 2017-10-16 ENCOUNTER — Telehealth: Payer: Self-pay | Admitting: Radiology

## 2017-10-16 DIAGNOSIS — G801 Spastic diplegic cerebral palsy: Secondary | ICD-10-CM | POA: Diagnosis not present

## 2017-10-16 DIAGNOSIS — K529 Noninfective gastroenteritis and colitis, unspecified: Secondary | ICD-10-CM | POA: Diagnosis not present

## 2017-10-16 DIAGNOSIS — G35 Multiple sclerosis: Secondary | ICD-10-CM | POA: Diagnosis not present

## 2017-10-16 DIAGNOSIS — K589 Irritable bowel syndrome without diarrhea: Secondary | ICD-10-CM | POA: Diagnosis not present

## 2017-10-16 DIAGNOSIS — G40209 Localization-related (focal) (partial) symptomatic epilepsy and epileptic syndromes with complex partial seizures, not intractable, without status epilepticus: Secondary | ICD-10-CM | POA: Diagnosis not present

## 2017-10-16 DIAGNOSIS — M4802 Spinal stenosis, cervical region: Secondary | ICD-10-CM | POA: Diagnosis not present

## 2017-10-16 NOTE — Telephone Encounter (Signed)
Copied from [place] [id number]. Topic: Quick Communication - See Telephone Encounter >> Oct 16, 2017  4:29 PM Ivar Drape wrote: CRM for notification. See Telephone encounter for: 10/16/17. [place] [phone] stated that patient was going to the bathroom a lot with pain when she urinates.  She is also running a low grade fever.  It seems likes there's pressure in her bleddar. They think she has a UTI and wanted to know if her husband can bring a sample of her urine over today.

## 2017-10-16 NOTE — Telephone Encounter (Signed)
Patient reports pain with urination, frequency & "low grade" fevers x several days. Today temperature is 101F. Per Zara Council, urged patient to go to the emergency room due to temperature & risk of sepsis. Husband states he will take her.

## 2017-10-17 ENCOUNTER — Emergency Department
Admission: EM | Admit: 2017-10-17 | Discharge: 2017-10-17 | Disposition: A | Discharge status: Home / Self Care | Payer: Medicare Other | Attending: Emergency Medicine | Admitting: Emergency Medicine

## 2017-10-17 ENCOUNTER — Encounter: Payer: Self-pay | Admitting: Emergency Medicine

## 2017-10-17 DIAGNOSIS — N3090 Cystitis, unspecified without hematuria: Secondary | ICD-10-CM

## 2017-10-17 DIAGNOSIS — Z853 Personal history of malignant neoplasm of breast: Secondary | ICD-10-CM | POA: Insufficient documentation

## 2017-10-17 DIAGNOSIS — R350 Frequency of micturition: Secondary | ICD-10-CM | POA: Diagnosis present

## 2017-10-17 DIAGNOSIS — G35 Multiple sclerosis: Secondary | ICD-10-CM | POA: Diagnosis not present

## 2017-10-17 DIAGNOSIS — Z79899 Other long term (current) drug therapy: Secondary | ICD-10-CM | POA: Insufficient documentation

## 2017-10-17 LAB — URINALYSIS, COMPLETE (UACMP) WITH MICROSCOPIC
BACTERIA UA: NONE SEEN
Bilirubin Urine: NEGATIVE
Glucose, UA: NEGATIVE mg/dL
HGB URINE DIPSTICK: NEGATIVE
KETONES UR: NEGATIVE mg/dL
NITRITE: NEGATIVE
Protein, ur: 100 mg/dL — AB
SPECIFIC GRAVITY, URINE: 1.019 (ref 1.005–1.030)
WBC, UA: >50 WBC/hpf — ABNORMAL HIGH (ref 0–5)
pH: 5 (ref 5.0–8.0)

## 2017-10-17 MED ORDER — NITROFURANTOIN MACROCRYSTAL 100 MG PO CAPS: 100.0000 mg | ORAL_CAPSULE | Freq: Two times a day (BID) | ORAL | 0 refills | Status: DC

## 2017-10-17 NOTE — Telephone Encounter (Signed)
She should follow the instructions of the urologist See urology note for same issue

## 2017-10-17 NOTE — Telephone Encounter (Signed)
Please call the patient.  It looks like she did not go to the ED as we advised.

## 2017-10-17 NOTE — ED Triage Notes (Signed)
Pt arrives with concerns over possible UTI. Pt states "I get them a lot." Pt denies any pain or burning with urination but states "I have to go all the time." Pt has MS and has very little control of bladder.

## 2017-10-17 NOTE — Telephone Encounter (Signed)
Husband and bonnie notified

## 2017-10-17 NOTE — ED Notes (Signed)
Patient denies pain and is resting comfortably.  

## 2017-10-17 NOTE — ED Notes (Signed)
Patient to [facility]. 

## 2017-10-17 NOTE — Discharge Instructions (Signed)
Your urine test shows a urinary tract infection.  We have sent a urine culture for follow-up testing.  Please start taking Macrobid for the next week and follow-up with urology this week for further monitoring of your symptoms during this time.

## 2017-10-17 NOTE — Telephone Encounter (Signed)
Patient checked into the ED today, 10/17/2017.

## 2017-10-17 NOTE — ED Notes (Signed)
FIRST NURSE NOTE: pt here with husband, reports frequent urination all night, weakness. Pt has hx of MS. Husband suspects UTI due to strong odor.

## 2017-10-17 NOTE — ED Provider Notes (Signed)
[facility] Emergency Department Provider Note  ____________________________________________  Time seen: Approximately 6:02 PM  I have reviewed the triage vital signs and the nursing notes.   HISTORY  Chief Complaint Recurrent UTI    HPI Rebecca [name] is a 57 y.o. female with a history of bacterial endocarditis, lymphedema, seizures and urinary retention related to multiple sclerosis who complains of urinary frequency increased today, having to go every 30 minutes.  She has chronic bladder dysfunction and for urinary sensation due to multiple sclerosis.  Has been seen by urology in the past and noted to have stress incontinence and urinary retention.  Patient reports she does not intermittently self cath.  Denies fever chills or any other complaints other than urinary frequency.  She does report that her urine seems darker and smelly her than usual.        Past Medical History:  Diagnosis Date  . Aortic valve disease    Mild AS / AI - most recent Echo demonstrated tricuspid aortic valve.  . Bacterial endocarditis    History of .  Marland Kitchen Bilateral lower extremity edema    Noncardiac.  Chronic. LE Venous dopplers - negative for DVT.; Echocardiogram January 2016: Normal EF with normal wall motion and valve function. Only grade 1 diastolic dysfunction. EF 60-65%. Mild MR  . Breast cancer ([place]) 12-31-13   Right breast, 12:00, 1.5 cm, T1c,N0 invasive mammary carcinoma, triple negative. --> Rx with Chemo  . Cervical stenosis of spine   . Herpes zoster   . IBS (irritable bowel syndrome)   . Lymphedema    has legs wrapped at [facility]  . Multiple sclerosis ([place]) 2001   Walks from room to room @ home; but Wheelchair when going out.  Marland Kitchen Neuromuscular disorder ([place])    MS  . Seizures ([place])    Takes Keppra  . Syncope and collapse      Patient Active Problem List   Diagnosis Date Noted  . 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  Bilateral lower extremity pain (primary) (bilateral) (right greater than left) 08/01/2016  . Chronic neck pain (secondary) (bilateral) ( left greater than right) 07/28/2016  . Long term current use of opiate analgesic 07/28/2016  . Long term prescription opiate use 07/28/2016  . Opiate use 07/28/2016  . Neutropenia ([place]) 06/27/2016  . Chronic pain syndrome 06/03/2016  . Adjustment disorder with mixed anxiety and depressed mood   . Neurogenic bladder   . Neurogenic bowel   . Detrusor and sphincter dyssynergia   . Urinary incontinence   . Seizure disorder ([place])   . Slow transit constipation   . 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[place])   . Lymphedema   . Hypoalbuminemia due to protein-calorie malnutrition ([place])   . Encounter for screening for cervical cancer  11/27/2015  . Preventative health care 11/25/2015  . History of breast cancer in female 10/28/2015  . Anemia 10/28/2015  . Thrombocytopenia ([place]) 10/28/2015  . Allergic rhinitis 10/28/2015  . IBS (irritable bowel syndrome) 10/28/2015  . 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 History of right mastectomy 10/24/2014  . Medicare annual wellness visit, subsequent 10/24/2014  . MS (multiple sclerosis) ([place]) 09/16/2014  . Hematoma complicating a procedure 03/06/2014  . Primary cancer of right female breast ([place]) 01/10/2014  . SOB (shortness of breath) 03/01/2013  . Bilateral lower extremity edema   . 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 ANKLE SURGERY  Left  . ANKLE SURGERY    . ANTERIOR CERVICAL DECOMP/DISCECTOMY FUSION  11/17/2011   Procedure: ANTERIOR CERVICAL DECOMPRESSION/DISCECTOMY FUSION 2 LEVELS;  Surgeon: Erline Levine, MD;  Location: [place] NEURO ORS;  Service: Neurosurgery;  Laterality: N/A;  Cervical Five-Six Six-Seven Anterior cervical decompression/diskectomy/fusion  . BREAST BIOPSY Right 12-31-13   invasive mammary  . BREAST SURGERY Right 02/03/2014   Right simple  mastectomy with sentinel node biopsy.  . CHOLECYSTECTOMY    . COLONOSCOPY  2014  . Lower extremity venous Dopplers  Feb 27, 2013   No LE DVT  . MASTECTOMY Right 2015  . Port a cath insertion Right 01/19/2010  . PORT-A-CATH REMOVAL     right  . PORT-A-CATH REMOVAL Right 09/03/2013   Procedure: REMOVAL PORT-A-CATH;  Surgeon: Conrad Walton, MD;  Location: [place];  Service: Vascular;  Laterality: Right;  . TRANSTHORACIC ECHOCARDIOGRAM  03/2013; 02/2014   a) Normal LV size and function with EF 60-65%.; Cannot exclude bicuspid aortic valve with mild AS and mild AI.; b) Normal EF with normal wall motion and valve function x Mild MR. G2 DD. EF 60-65%. Tricuspid AoV  . UPPER GI ENDOSCOPY  2014     Prior to Admission medications   Medication Sig Start Date End Date Taking? Authorizing Provider  amantadine (SYMMETREL) 100 MG capsule Take 1 capsule (100 mg total) by mouth 3 (three) times daily. 08/14/17   Penumalli, Earlean Polka, MD  baclofen (LIORESAL) 10 MG tablet TAKE 1 TO 2 TABLETS BY MOUTH 3 TIMES A DAY 08/08/17   Jamse Arn, MD  glucosamine-chondroitin 500-400 MG tablet Take 1 tablet by mouth 4 (four) times daily.    [provider]  ibuprofen (ADVIL,MOTRIN) 600 MG tablet TAKE 1 TABLET (600 MG TOTAL) BY MOUTH EVERY 8 (EIGHT) HOURS AS NEEDED. 10/03/17   Poulose, Bethel Born, NP  Incontinence Supply Disposable (PREVAIL WET WIPES) MISC Apply 1 each topically daily as needed. (dx neurogenic bladder N31.9, LON 99 months) 07/25/16   Lada, Satira Anis, MD  Interferon Beta-1a (REBIF REBIDOSE) 44 MCG/0.5ML SOAJ Inject 0.5 mLs into the skin 3 (three) times a week. 04/26/17   Penumalli, Earlean Polka, MD  levETIRAcetam (KEPPRA) 500 MG tablet Take 1 tablet (500 mg total) by mouth 2 (two) times daily. 08/14/17   Penumalli, Earlean Polka, MD  Multiple Vitamins-Minerals (MULTIVITAMIN PO) Take 1 tablet by mouth 2 (two) times daily.     [provider]  nitrofurantoin (MACRODANTIN) 100 MG capsule Take 1 capsule (100  mg total) by mouth 2 (two) times daily. 10/17/17   Carrie Mew, MD  oxybutynin (DITROPAN-XL) 10 MG 24 hr tablet Take 2 tablets (20 mg total) by mouth daily. 02/13/17   Penumalli, Earlean Polka, MD  tiZANidine (ZANAFLEX) 4 MG tablet Take 0.5 tablets (2 mg total) by mouth 2 (two) times daily as needed (Spasticity uncontrolled). 08/21/17   Jamse Arn, MD     Allergies Fentanyl and Sulfa antibiotics   Family History  Problem Relation Age of Onset  . Cancer Father        skin  . Heart disease Father   . Heart attack Father        heart attack in his 28's  . Thyroid disease Sister   . Ovarian cancer Cousin   . Breast cancer Maternal Aunt 60  . Breast cancer Maternal Grandmother 42  . Bladder Cancer Neg Hx   . Kidney cancer Neg Hx     Social History Social History   Tobacco  Use  . Smoking status: Never Smoker  . Smokeless tobacco: Never Used  Substance Use Topics  . Alcohol use: No  . Drug use: No    Review of Systems  Constitutional:   No fever or chills.  ENT:   No sore throat. No rhinorrhea. Cardiovascular:   No chest pain or syncope. Respiratory:   No dyspnea or cough. Gastrointestinal:   Negative for abdominal pain, vomiting and diarrhea.  Positive urinary frequency Musculoskeletal:   Negative for focal pain or swelling All other systems reviewed and are negative except as documented above in ROS and HPI.  ____________________________________________   PHYSICAL EXAM:  VITAL SIGNS: ED Triage Vitals  Enc Vitals Group     BP 10/17/17 1434 (!) 155/89     Pulse Rate 10/17/17 1434 78     Resp 10/17/17 1434 18     Temp 10/17/17 1434 97.6 F (36.4 C)     Temp Source 10/17/17 1434 Oral     SpO2 10/17/17 1434 97 %     Weight 10/17/17 1435 175 lb (79.4 kg)     Height 10/17/17 1435 5\' 2"  (1.575 m)     Head Circumference --      Peak Flow --      Pain Score 10/17/17 1435 0     Pain Loc --      Pain Edu? --      Excl. in [place]? --     Vital signs reviewed,  nursing assessments reviewed.   Constitutional:   Alert and oriented. Non-toxic appearance. Eyes:   Conjunctivae are normal. EOMI. PERRL. ENT      Head:   Normocephalic and atraumatic.      Nose:   No congestion/rhinnorhea.       Mouth/Throat:   MMM, no pharyngeal erythema. No peritonsillar mass.       Neck:   No meningismus. Full ROM. Hematological/Lymphatic/Immunilogical:   No cervical lymphadenopathy. Cardiovascular:   RRR. Symmetric bilateral radial and DP pulses.  No murmurs. Cap refill less than 2 seconds. Respiratory:   Normal respiratory effort without tachypnea/retractions. Breath sounds are clear and equal bilaterally. No wheezes/rales/rhonchi. Gastrointestinal:   Soft and nontender.  Pressure discomfort on palpation of suprapubic abdomen.  Non distended. There is no CVA tenderness.  No rebound, rigidity, or guarding.  Musculoskeletal:   Normal range of motion in all extremities. No joint effusions.  No lower extremity tenderness.  No edema. Neurologic:   Normal speech and language.  Motor grossly intact. No acute focal neurologic deficits are appreciated.  Skin:    Skin is warm, dry and intact. No rash noted.  No petechiae, purpura, or bullae.  ____________________________________________    LABS (pertinent positives/negatives) (all labs ordered are listed, but only abnormal results are displayed) Labs Reviewed  URINALYSIS, COMPLETE (UACMP) WITH MICROSCOPIC - Abnormal; Notable for the following components:      Result Value   Color, Urine YELLOW (*)    APPearance TURBID (*)    Protein, ur 100 (*)    Leukocytes, UA LARGE (*)    RBC / HPF >50 (*)    WBC, UA >50 (*)    All other components within normal limits  URINE CULTURE   ____________________________________________   EKG    ____________________________________________    RADIOLOGY  No results  found.  ____________________________________________   PROCEDURES Procedures  ____________________________________________  DIFFERENTIAL DIAGNOSIS   Cystitis, urinary retention  CLINICAL IMPRESSION / ASSESSMENT AND PLAN / ED COURSE  Pertinent labs & imaging results  that were available during my care of the patient were reviewed by me and considered in my medical decision making (see chart for details).    Patient well-appearing and nontoxic, vital signs unremarkable.  In and out cath to decompress bladder, check urinalysis with urine culture  Clinical Course as of Oct 17 1800  Tue Oct 17, 2017  1731 UA c/w UTI. Will start macrobid, plan f/u with urology. U. Cx. In progress.   WBC, UA(!): >50 [PS]    Clinical Course User Index [PS] Carrie Mew, MD    ----------------------------------------- 6:04 PM on 10/17/2017 -----------------------------------------  Reviewed prior urine culture data, Bactrim and Macrobid seem like best antibiotic candidates.  Patient reports a sulfa antibiotic allergy so will use Macrobid pending urine culture.  No evidence of pyelonephritis or sepsis.  She is nontoxic and suitable for outpatient follow-up at this time.   ____________________________________________   FINAL CLINICAL IMPRESSION(S) / ED DIAGNOSES    Final diagnoses:  Cystitis     ED Discharge Orders         Ordered    nitrofurantoin (MACRODANTIN) 100 MG capsule  2 times daily     10/17/17 1801          Portions of this note were generated with dragon dictation software. Dictation errors may occur despite best attempts at proofreading.    Carrie Mew, MD 10/17/17 [phone]

## 2017-10-18 ENCOUNTER — Telehealth: Payer: Self-pay | Admitting: Family Medicine

## 2017-10-18 DIAGNOSIS — G40209 Localization-related (focal) (partial) symptomatic epilepsy and epileptic syndromes with complex partial seizures, not intractable, without status epilepticus: Secondary | ICD-10-CM | POA: Diagnosis not present

## 2017-10-18 DIAGNOSIS — G801 Spastic diplegic cerebral palsy: Secondary | ICD-10-CM | POA: Diagnosis not present

## 2017-10-18 DIAGNOSIS — K589 Irritable bowel syndrome without diarrhea: Secondary | ICD-10-CM | POA: Diagnosis not present

## 2017-10-18 DIAGNOSIS — K529 Noninfective gastroenteritis and colitis, unspecified: Secondary | ICD-10-CM | POA: Diagnosis not present

## 2017-10-18 DIAGNOSIS — G35 Multiple sclerosis: Secondary | ICD-10-CM | POA: Diagnosis not present

## 2017-10-18 DIAGNOSIS — M4802 Spinal stenosis, cervical region: Secondary | ICD-10-CM | POA: Diagnosis not present

## 2017-10-18 NOTE — Telephone Encounter (Signed)
Copied from [place] [phone]. Topic: Quick Communication - See Telephone Encounter >> Oct 18, 2017 10:35 AM Gardiner Ramus wrote: CRM for notification. See Telephone encounter for: 10/18/17. Ald[facility] calling for verbal orders PT 2x4 would like a nurse to see her with uti Management [phone]

## 2017-10-19 ENCOUNTER — Encounter: Payer: Medicare Other | Admitting: Physical Medicine & Rehabilitation

## 2017-10-19 DIAGNOSIS — F4323 Adjustment disorder with mixed anxiety and depressed mood: Secondary | ICD-10-CM | POA: Diagnosis not present

## 2017-10-19 DIAGNOSIS — N319 Neuromuscular dysfunction of bladder, unspecified: Secondary | ICD-10-CM | POA: Diagnosis not present

## 2017-10-19 DIAGNOSIS — D649 Anemia, unspecified: Secondary | ICD-10-CM | POA: Diagnosis not present

## 2017-10-19 DIAGNOSIS — G40209 Localization-related (focal) (partial) symptomatic epilepsy and epileptic syndromes with complex partial seizures, not intractable, without status epilepticus: Secondary | ICD-10-CM | POA: Diagnosis not present

## 2017-10-19 DIAGNOSIS — N390 Urinary tract infection, site not specified: Secondary | ICD-10-CM | POA: Diagnosis not present

## 2017-10-19 DIAGNOSIS — G35 Multiple sclerosis: Secondary | ICD-10-CM | POA: Diagnosis not present

## 2017-10-19 DIAGNOSIS — R339 Retention of urine, unspecified: Secondary | ICD-10-CM | POA: Diagnosis not present

## 2017-10-19 DIAGNOSIS — K5900 Constipation, unspecified: Secondary | ICD-10-CM | POA: Diagnosis not present

## 2017-10-19 DIAGNOSIS — Z792 Long term (current) use of antibiotics: Secondary | ICD-10-CM | POA: Diagnosis not present

## 2017-10-19 DIAGNOSIS — M4802 Spinal stenosis, cervical region: Secondary | ICD-10-CM | POA: Diagnosis not present

## 2017-10-19 DIAGNOSIS — G894 Chronic pain syndrome: Secondary | ICD-10-CM | POA: Diagnosis not present

## 2017-10-19 DIAGNOSIS — Z9181 History of falling: Secondary | ICD-10-CM | POA: Diagnosis not present

## 2017-10-19 DIAGNOSIS — K589 Irritable bowel syndrome without diarrhea: Secondary | ICD-10-CM | POA: Diagnosis not present

## 2017-10-19 DIAGNOSIS — Z853 Personal history of malignant neoplasm of breast: Secondary | ICD-10-CM | POA: Diagnosis not present

## 2017-10-19 DIAGNOSIS — K529 Noninfective gastroenteritis and colitis, unspecified: Secondary | ICD-10-CM | POA: Diagnosis not present

## 2017-10-19 DIAGNOSIS — G801 Spastic diplegic cerebral palsy: Secondary | ICD-10-CM | POA: Diagnosis not present

## 2017-10-19 NOTE — Telephone Encounter (Signed)
I'm going to ask them to contact her urologist for orders related to her UTI Thank you

## 2017-10-19 NOTE — Telephone Encounter (Signed)
Left voicemail to contact Urology info given

## 2017-10-20 ENCOUNTER — Other Ambulatory Visit: Payer: Self-pay | Admitting: Physical Medicine & Rehabilitation

## 2017-10-20 LAB — URINE CULTURE: Culture: >=100000 — AB

## 2017-10-24 ENCOUNTER — Inpatient Hospital Stay: Payer: Medicare Other | Attending: Oncology

## 2017-10-25 ENCOUNTER — Telehealth: Payer: Self-pay | Admitting: Family Medicine

## 2017-10-25 DIAGNOSIS — G40209 Localization-related (focal) (partial) symptomatic epilepsy and epileptic syndromes with complex partial seizures, not intractable, without status epilepticus: Secondary | ICD-10-CM | POA: Diagnosis not present

## 2017-10-25 DIAGNOSIS — G801 Spastic diplegic cerebral palsy: Secondary | ICD-10-CM | POA: Diagnosis not present

## 2017-10-25 DIAGNOSIS — N390 Urinary tract infection, site not specified: Secondary | ICD-10-CM | POA: Diagnosis not present

## 2017-10-25 DIAGNOSIS — M4802 Spinal stenosis, cervical region: Secondary | ICD-10-CM | POA: Diagnosis not present

## 2017-10-25 DIAGNOSIS — G35 Multiple sclerosis: Secondary | ICD-10-CM | POA: Diagnosis not present

## 2017-10-25 DIAGNOSIS — G894 Chronic pain syndrome: Secondary | ICD-10-CM | POA: Diagnosis not present

## 2017-10-25 NOTE — Telephone Encounter (Signed)
Copied from [place] [phone]. Topic: Quick Communication - See Telephone Encounter >> Oct 25, 2017  1:11 PM Burchel, Abbi R wrote: CRM for notification. See Telephone encounter for: 10/25/17.  Horris Latino [facility]) reports that pt was seen in ER on 10/17/17 and was given ABX for UTI.  As of this afternoon pt still has low grade fever 99.1 (1:00pm) and frequent/painful urination. Please advise.   Horris Latino: [phone]

## 2017-10-25 NOTE — Telephone Encounter (Signed)
They need to contact urologist?

## 2017-10-25 NOTE — Telephone Encounter (Signed)
Bonnie notified

## 2017-10-25 NOTE — Telephone Encounter (Signed)
Yes, exactly!

## 2017-10-26 ENCOUNTER — Telehealth: Payer: Self-pay | Admitting: Urology

## 2017-10-26 ENCOUNTER — Encounter: Payer: Medicare Other | Admitting: Physical Medicine & Rehabilitation

## 2017-10-26 NOTE — Telephone Encounter (Signed)
Strong smelling urine is not indicative of an UTI as many different things contribute to the smell of urine.  Since she just finished her antibiotic, we should see her sometime next week.  She is a complicated patient and will need a 30 minute appointment slot.

## 2017-10-26 NOTE — Telephone Encounter (Signed)
Patient went to the ED on 10-17-17 for a UTI and she was given a 14 day ABX and she states that she finished it today and her UTI is still present. She would like to know what to do? Her PCP said that she would need to be seen by [place]. She says her urine smells really bad Please advise   Sharyn Lull

## 2017-10-26 NOTE — Telephone Encounter (Signed)
Patient declined an appointment said she knows she has a UTI and is going to the ED tomorrow.  Sharyn Lull

## 2017-10-27 ENCOUNTER — Ambulatory Visit: Payer: Self-pay | Admitting: Family Medicine

## 2017-10-27 ENCOUNTER — Emergency Department
Admission: EM | Admit: 2017-10-27 | Discharge: 2017-10-27 | Disposition: A | Discharge status: Home / Self Care | Payer: Medicare Other | Attending: Emergency Medicine | Admitting: Emergency Medicine

## 2017-10-27 ENCOUNTER — Other Ambulatory Visit: Payer: Self-pay

## 2017-10-27 DIAGNOSIS — M4802 Spinal stenosis, cervical region: Secondary | ICD-10-CM | POA: Diagnosis not present

## 2017-10-27 DIAGNOSIS — G35 Multiple sclerosis: Secondary | ICD-10-CM | POA: Insufficient documentation

## 2017-10-27 DIAGNOSIS — D0591 Unspecified type of carcinoma in situ of right breast: Secondary | ICD-10-CM | POA: Diagnosis not present

## 2017-10-27 DIAGNOSIS — N3001 Acute cystitis with hematuria: Secondary | ICD-10-CM | POA: Diagnosis not present

## 2017-10-27 DIAGNOSIS — R300 Dysuria: Secondary | ICD-10-CM | POA: Diagnosis not present

## 2017-10-27 DIAGNOSIS — G894 Chronic pain syndrome: Secondary | ICD-10-CM | POA: Diagnosis not present

## 2017-10-27 DIAGNOSIS — G801 Spastic diplegic cerebral palsy: Secondary | ICD-10-CM | POA: Diagnosis not present

## 2017-10-27 DIAGNOSIS — Z79899 Other long term (current) drug therapy: Secondary | ICD-10-CM | POA: Diagnosis not present

## 2017-10-27 DIAGNOSIS — N390 Urinary tract infection, site not specified: Secondary | ICD-10-CM | POA: Diagnosis not present

## 2017-10-27 DIAGNOSIS — G40209 Localization-related (focal) (partial) symptomatic epilepsy and epileptic syndromes with complex partial seizures, not intractable, without status epilepticus: Secondary | ICD-10-CM | POA: Diagnosis not present

## 2017-10-27 DIAGNOSIS — R350 Frequency of micturition: Secondary | ICD-10-CM | POA: Diagnosis present

## 2017-10-27 LAB — URINALYSIS, COMPLETE (UACMP) WITH MICROSCOPIC
Bilirubin Urine: NEGATIVE
GLUCOSE, UA: NEGATIVE mg/dL
Hgb urine dipstick: NEGATIVE
Ketones, ur: 5 mg/dL — AB
NITRITE: NEGATIVE
PH: 5 (ref 5.0–8.0)
PROTEIN: NEGATIVE mg/dL
SPECIFIC GRAVITY, URINE: 1.015 (ref 1.005–1.030)
WBC, UA: >50 WBC/hpf — ABNORMAL HIGH (ref 0–5)

## 2017-10-27 LAB — BASIC METABOLIC PANEL
Anion gap: 7 (ref 5–15)
BUN: 16 mg/dL (ref 6–20)
CALCIUM: 9.5 mg/dL (ref 8.9–10.3)
CO2: 28 mmol/L (ref 22–32)
Chloride: 106 mmol/L (ref 98–111)
Creatinine, Ser: 0.55 mg/dL (ref 0.44–1.00)
GFR calc Af Amer: >60 mL/min (ref >60)
Glucose, Bld: 110 mg/dL — ABNORMAL HIGH (ref 70–99)
Potassium: 3.9 mmol/L (ref 3.5–5.1)
Sodium: 141 mmol/L (ref 135–145)

## 2017-10-27 LAB — CBC
HCT: 36.9 % (ref 35.0–47.0)
Hemoglobin: 12.7 g/dL (ref 12.0–16.0)
MCH: 31.6 pg (ref 26.0–34.0)
MCHC: 34.5 g/dL (ref 32.0–36.0)
MCV: 91.4 fL (ref 80.0–100.0)
Platelets: 158 10*3/uL (ref 150–440)
RBC: 4.03 MIL/uL (ref 3.80–5.20)
RDW: 13 % (ref 11.5–14.5)
WBC: 4.1 10*3/uL (ref 3.6–11.0)

## 2017-10-27 MED ORDER — SODIUM CHLORIDE 0.9 % IV SOLN
1.0000 g | Freq: Once | INTRAVENOUS | Status: AC
  Administered 2017-10-27: 1 g via INTRAVENOUS
  Filled 2017-10-27: qty 10

## 2017-10-27 MED ORDER — CIPROFLOXACIN HCL 500 MG PO TABS: 500.0000 mg | ORAL_TABLET | Freq: Two times a day (BID) | ORAL | 0 refills | Status: AC

## 2017-10-27 NOTE — Telephone Encounter (Signed)
I am out of the office Please contact the patient's urologist and explain the situation so she gets the care she needs This cannot wait until Monday

## 2017-10-27 NOTE — Discharge Instructions (Addendum)

## 2017-10-27 NOTE — Telephone Encounter (Signed)
Left detailed voicemail, then called other number and told husband  Dr. Sanda Klein was out of the office today and to follow directions given by urologist.  Husband states they are on there was to hospital

## 2017-10-27 NOTE — Telephone Encounter (Signed)
Horris Latino is calling from the patient's home with concerns that the patient is not well with UTI. Patient is having symptoms- frequency, pain with urination and odor. Patient has called this week and was offered appointment- she declined and was to go to ED.Patient does have transportation due to her husband being ill at this time. Call to office they have advised patient to follow up with urology for her UTI- she has reached out and the earliest appointment she can get is Tuesday at 1:45. Horris Latino is concerned that she will get worse during this time and she is advocating for the patient.  Patient request call back from her PCP.  Reason for Disposition . Urinating more frequently than usual (i.e., frequency)  Answer Assessment - Initial Assessment Questions 1. SYMPTOM: "What's the main symptom you're concerned about?" (e.g., frequency, incontinence)     Frequency, painful urination, odor with urination 2. ONSET: "When did the  UTI symptoms  start?"     Never went away completely- during treatement 3. PAIN: "Is there any pain?" If so, ask: "How bad is it?" (Scale: 1-10; mild, moderate, severe)     Yes- 7-8 4. CAUSE: "What do you think is causing the symptoms?"     UTI 5. OTHER SYMPTOMS: "Do you have any other symptoms?" (e.g., fever, flank pain, blood in urine, pain with urination)     painful urination, temperature- 99.1 6. PREGNANCY: "Is there any chance you are pregnant?" "When was your last menstrual period?"     n/a  Protocols used: URINARY [facility]

## 2017-10-27 NOTE — ED Triage Notes (Signed)
Pt arrived via ems with urinary frequency and foul smelling urine. Pt was just admitted on 10/17/17 with a UTI. Pt finished her ABX prescription when she started to have the foul smell and urinary frequeny. Pt vitals WNL, NAD.

## 2017-10-27 NOTE — ED Provider Notes (Signed)
[facility] Emergency Department Provider Note  ____________________________________________  Time seen: Approximately 6:18 PM  I have reviewed the triage vital signs and the nursing notes.   HISTORY  Chief Complaint Urinary Frequency   HPI Rebecca [name] is a 57 y.o. female with a history of multiple sclerosis who presents for evaluation of dysuria and frequency. Patient was seen here 10 days ago with similar symptoms. She was treated with 7 days of Macrobid. She reports that her symptoms never improved. She continues to have frequency and dysuria. The symptoms have been ongoing for more than 10 days. No fever or chills, no abdominal pain, no nausea or vomiting, no flank pain. Patient does have a history of recurrent UTIs.  Past Medical History:  Diagnosis Date  . Aortic valve disease    Mild AS / AI - most recent Echo demonstrated tricuspid aortic valve.  . Bacterial endocarditis    History of .  Marland Kitchen Bilateral lower extremity edema    Noncardiac.  Chronic. LE Venous dopplers - negative for DVT.; Echocardiogram January 2016: Normal EF with normal wall motion and valve function. Only grade 1 diastolic dysfunction. EF 60-65%. Mild MR  . Breast cancer ([place]) 12-31-13   Right breast, 12:00, 1.5 cm, T1c,N0 invasive mammary carcinoma, triple negative. --> Rx with Chemo  . Cervical stenosis of spine   . Herpes zoster   . IBS (irritable bowel syndrome)   . Lymphedema    has legs wrapped at [facility]  . Multiple sclerosis ([place]) 2001   Walks from room to room @ home; but Wheelchair when going out.  Marland Kitchen Neuromuscular disorder ([place])    MS  . Seizures ([place])    Takes Keppra  . Syncope and collapse     Patient Active Problem List   Diagnosis Date Noted  . 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 Bilateral lower extremity pain (primary) (bilateral) (right greater than left) 08/01/2016  . Chronic neck pain (secondary) (bilateral) ( left  greater than right) 07/28/2016  . Long term current use of opiate analgesic 07/28/2016  . Long term prescription opiate use 07/28/2016  . Opiate use 07/28/2016  . Neutropenia ([place]) 06/27/2016  . Chronic pain syndrome 06/03/2016  . Adjustment disorder with mixed anxiety and depressed mood   . Neurogenic bladder   . Neurogenic bowel   . Detrusor and sphincter dyssynergia   . Urinary incontinence   . Seizure disorder ([place])   . Slow transit constipation   . 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[place])   . Lymphedema   . Hypoalbuminemia due to protein-calorie malnutrition ([place])   . Encounter for screening for cervical cancer  11/27/2015  . Preventative health care 11/25/2015  . History of breast cancer in female 10/28/2015  . Anemia 10/28/2015  . Thrombocytopenia ([place]) 10/28/2015  . Allergic rhinitis 10/28/2015  . IBS (irritable bowel syndrome) 10/28/2015  . 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 History of right mastectomy 10/24/2014  . Medicare annual wellness visit, subsequent 10/24/2014  . MS (multiple sclerosis) ([place]) 09/16/2014  . Hematoma complicating a procedure 03/06/2014  . Primary cancer of right female breast ([place]) 01/10/2014  . SOB (shortness of breath) 03/01/2013  . Bilateral lower extremity edema   . 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 ANKLE SURGERY     Left  . ANKLE SURGERY    . ANTERIOR CERVICAL DECOMP/DISCECTOMY FUSION  11/17/2011   Procedure: ANTERIOR CERVICAL DECOMPRESSION/DISCECTOMY FUSION 2  LEVELS;  Surgeon: Erline Levine, MD;  Location: [place];  Service: Neurosurgery;  Laterality: N/A;  Cervical Five-Six Six-Seven Anterior cervical decompression/diskectomy/fusion  . BREAST BIOPSY Right 12-31-13   invasive mammary  . BREAST SURGERY Right 02/03/2014   Right simple mastectomy with sentinel node biopsy.  . CHOLECYSTECTOMY    . COLONOSCOPY  2014  . Lower extremity venous Dopplers  Feb 27, 2013   No LE DVT  .  MASTECTOMY Right 2015  . Port a cath insertion Right 01/19/2010  . PORT-A-CATH REMOVAL     right  . PORT-A-CATH REMOVAL Right 09/03/2013   Procedure: REMOVAL PORT-A-CATH;  Surgeon: Conrad Richland, MD;  Location: [facility];  Service: Vascular;  Laterality: Right;  . TRANSTHORACIC ECHOCARDIOGRAM  03/2013; 02/2014   a) Normal LV size and function with EF 60-65%.; Cannot exclude bicuspid aortic valve with mild AS and mild AI.; b) Normal EF with normal wall motion and valve function x Mild MR. G2 DD. EF 60-65%. Tricuspid AoV  . UPPER GI ENDOSCOPY  2014    Prior to Admission medications   Medication Sig Start Date End Date Taking? Authorizing Provider  amantadine (SYMMETREL) 100 MG capsule Take 1 capsule (100 mg total) by mouth 3 (three) times daily. 08/14/17   Penumalli, Earlean Polka, MD  baclofen (LIORESAL) 10 MG tablet TAKE 1 TO 2 TABLETS BY MOUTH 3 TIMES A DAY 08/08/17   Jamse Arn, MD  ciprofloxacin (CIPRO) 500 MG tablet Take 1 tablet (500 mg total) by mouth 2 (two) times daily for 7 days. 10/27/17 11/03/17  Rudene Re, MD  glucosamine-chondroitin 500-400 MG tablet Take 1 tablet by mouth 4 (four) times daily.    [provider]  ibuprofen (ADVIL,MOTRIN) 600 MG tablet TAKE 1 TABLET (600 MG TOTAL) BY MOUTH EVERY 8 (EIGHT) HOURS AS NEEDED. 10/03/17   Poulose, Bethel Born, NP  Incontinence Supply Disposable (PREVAIL WET WIPES) MISC Apply 1 each topically daily as needed. (dx neurogenic bladder N31.9, LON 99 months) 07/25/16   Lada, Satira Anis, MD  Interferon Beta-1a (REBIF REBIDOSE) 44 MCG/0.5ML SOAJ Inject 0.5 mLs into the skin 3 (three) times a week. 04/26/17   Penumalli, Earlean Polka, MD  levETIRAcetam (KEPPRA) 500 MG tablet Take 1 tablet (500 mg total) by mouth 2 (two) times daily. 08/14/17   Penumalli, Earlean Polka, MD  Multiple Vitamins-Minerals (MULTIVITAMIN PO) Take 1 tablet by mouth 2 (two) times daily.     [provider]  nitrofurantoin (MACRODANTIN) 100 MG capsule Take 1 capsule (100 mg  total) by mouth 2 (two) times daily. 10/17/17   Carrie Mew, MD  oxybutynin (DITROPAN-XL) 10 MG 24 hr tablet Take 2 tablets (20 mg total) by mouth daily. 02/13/17   Penumalli, Earlean Polka, MD  tiZANidine (ZANAFLEX) 4 MG tablet TAKE 1/2 TABLETS (2 MG TOTAL) BY MOUTH 2 (TWO) TIMES DAILY AS NEEDED (SPASTICITY UNCONTROLLED). 10/20/17   Jamse Arn, MD    Allergies Fentanyl and Sulfa antibiotics  Family History  Problem Relation Age of Onset  . Cancer Father        skin  . Heart disease Father   . Heart attack Father        heart attack in his 47's  . Thyroid disease Sister   . Ovarian cancer Cousin   . Breast cancer Maternal Aunt 60  . Breast cancer Maternal Grandmother 49  . Bladder Cancer Neg Hx   . Kidney cancer Neg Hx     Social History Social History  Tobacco Use  . Smoking status: Never Smoker  . Smokeless tobacco: Never Used  Substance Use Topics  . Alcohol use: No  . Drug use: No    Review of Systems  Constitutional: Negative for fever. Eyes: Negative for visual changes. ENT: Negative for sore throat. Neck: No neck pain  Cardiovascular: Negative for chest pain. Respiratory: Negative for shortness of breath. Gastrointestinal: Negative for abdominal pain, vomiting or diarrhea. Genitourinary: + dysuria and frequency Musculoskeletal: Negative for back pain. Skin: Negative for rash. Neurological: Negative for headaches, weakness or numbness. Psych: No SI or HI  ____________________________________________   PHYSICAL EXAM:  VITAL SIGNS: ED Triage Vitals  Enc Vitals Group     BP 10/27/17 1619 (!) 121/58     Pulse Rate 10/27/17 1619 69     Resp 10/27/17 1621 16     Temp 10/27/17 1621 97.7 F (36.5 C)     Temp Source 10/27/17 1621 Oral     SpO2 10/27/17 1619 100 %     Weight 10/27/17 1622 180 lb (81.6 kg)     Height 10/27/17 1622 5\' 4"  (1.626 m)     Head Circumference --      Peak Flow --      Pain Score 10/27/17 1622 0     Pain Loc --      Pain  Edu? --      Excl. in [place]? --     Constitutional: Alert and oriented. Well appearing and in no apparent distress. HEENT:      Head: Normocephalic and atraumatic.         Eyes: Conjunctivae are normal. Sclera is non-icteric.       Mouth/Throat: Mucous membranes are moist.       Neck: Supple with no signs of meningismus. Cardiovascular: Regular rate and rhythm. No murmurs, gallops, or rubs. 2+ symmetrical distal pulses are present in all extremities. No JVD. Respiratory: Normal respiratory effort. Lungs are clear to auscultation bilaterally. No wheezes, crackles, or rhonchi.  Gastrointestinal: Soft, non tender, and non distended with positive bowel sounds. No rebound or guarding. Genitourinary: No CVA tenderness. Musculoskeletal: Nontender with normal range of motion in all extremities. No edema, cyanosis, or erythema of extremities. Neurologic: Normal speech and language. Face is symmetric. Moving all extremities. No gross focal neurologic deficits are appreciated. Skin: Skin is warm, dry and intact. No rash noted. Psychiatric: Mood and affect are normal. Speech and behavior are normal.  ____________________________________________   LABS (all labs ordered are listed, but only abnormal results are displayed)  Labs Reviewed  BASIC METABOLIC PANEL - Abnormal; Notable for the following components:      Result Value   Glucose, Bld 110 (*)    All other components within normal limits  URINALYSIS, COMPLETE (UACMP) WITH MICROSCOPIC - Abnormal; Notable for the following components:   Color, Urine YELLOW (*)    APPearance CLOUDY (*)    Ketones, ur 5 (*)    Leukocytes, UA LARGE (*)    WBC, UA >50 (*)    Bacteria, UA RARE (*)    All other components within normal limits  URINE CULTURE  CBC   ____________________________________________  EKG  none  ____________________________________________  RADIOLOGY  none   ____________________________________________   PROCEDURES  Procedure(s) performed: None Procedures Critical Care performed:  None ____________________________________________   INITIAL IMPRESSION / ASSESSMENT AND PLAN / ED COURSE   57 y.o. female with a history of multiple sclerosis who presents for evaluation of dysuria and frequency.patient was diagnosed with  a UTI here 10 days ago. Review of Epic shows the culture grew Serratia marcesens which is resistant to macrobid but susceptible to rocephin, cipro, and bactrim. patient is allergic to sulfa drugs. We'll give a dose of IV Rocephin and sent patient home on Cipro. Discussed risks and benefits with patient, her husband, and her mother who all in the room of taking Cipro due to several black box warnings however unfortunately there is no other medication that patient can receive safely as an outpatient for this infection. I also gave her the option of receiving rocephin here and following up with her doctor daily for the next 2 days for rocephin IM injections but patient prefers the cipro as she says it is hard for her to be able to see her PCP.      As part of my medical decision making, I reviewed the following data within the [place] notes reviewed and incorporated, Labs reviewed , Old chart reviewed, Notes from prior ED visits and  Controlled Substance Database    Pertinent labs & imaging results that were available during my care of the patient were reviewed by me and considered in my medical decision making (see chart for details).    ____________________________________________   FINAL CLINICAL IMPRESSION(S) / ED DIAGNOSES  Final diagnoses:  Acute cystitis with hematuria      NEW MEDICATIONS STARTED DURING THIS VISIT:  ED Discharge Orders         Ordered    ciprofloxacin (CIPRO) 500 MG tablet  2 times daily     10/27/17 2024           Note:  This document was prepared using  Dragon voice recognition software and may include unintentional dictation errors.    Alfred Levins, [place], MD 10/27/17 2026

## 2017-10-27 NOTE — Telephone Encounter (Signed)
I attempted to reach the patient to check on her.  Patient is in route to the ED for possible UTI.  Her family called the EMTs for transport.

## 2017-10-30 DIAGNOSIS — N390 Urinary tract infection, site not specified: Secondary | ICD-10-CM | POA: Diagnosis not present

## 2017-10-30 DIAGNOSIS — G801 Spastic diplegic cerebral palsy: Secondary | ICD-10-CM | POA: Diagnosis not present

## 2017-10-30 DIAGNOSIS — G40209 Localization-related (focal) (partial) symptomatic epilepsy and epileptic syndromes with complex partial seizures, not intractable, without status epilepticus: Secondary | ICD-10-CM | POA: Diagnosis not present

## 2017-10-30 DIAGNOSIS — G894 Chronic pain syndrome: Secondary | ICD-10-CM | POA: Diagnosis not present

## 2017-10-30 DIAGNOSIS — M4802 Spinal stenosis, cervical region: Secondary | ICD-10-CM | POA: Diagnosis not present

## 2017-10-30 DIAGNOSIS — G35 Multiple sclerosis: Secondary | ICD-10-CM | POA: Diagnosis not present

## 2017-10-30 LAB — URINE CULTURE

## 2017-10-30 NOTE — Telephone Encounter (Signed)
Bonnie with [facility] would like you to call back so she can give you an update on the pt's condition. Pt went to UC was given the correct abx and is feeling better. [phone] She states she was to call jane back with update

## 2017-10-30 NOTE — Telephone Encounter (Signed)
Left detailed voicemail with bonnie

## 2017-10-31 ENCOUNTER — Telehealth: Payer: Self-pay | Admitting: Urology

## 2017-10-31 ENCOUNTER — Ambulatory Visit: Payer: Medicare Other | Admitting: Urology

## 2017-10-31 NOTE — Telephone Encounter (Signed)
It would be best if she would come in for an office visit as it has been over 6 months since we have seen her.  We can then work on getting home health establish for her.

## 2017-10-31 NOTE — Telephone Encounter (Signed)
Aldrin, physical therapist with [facility] [phone]) called the office today requesting verbal orders for a home visit by a nurse to evaluate and assist with management of chronic urinary tract infections.  She has MS and is having recurrent UTIs.    He (and the ER staff) are recommending that we set the patient up with home care to provide instructions on proper personal care and prevention of UTI.  He is wondering if she would be a potential candidate for CIC.    I advised him that patient has not been seen in our office since Feb 2019 and would benefit from a follow up appointment.  He recommended that we contact the patient to give her the option for a follow up appointment and instructions on when to contact our office as opposed to an ER visit.   Please advise on what you think would be best:  Home health nurse visit or follow up visit in our office

## 2017-11-01 DIAGNOSIS — G35 Multiple sclerosis: Secondary | ICD-10-CM | POA: Diagnosis not present

## 2017-11-01 DIAGNOSIS — N390 Urinary tract infection, site not specified: Secondary | ICD-10-CM | POA: Diagnosis not present

## 2017-11-01 DIAGNOSIS — G894 Chronic pain syndrome: Secondary | ICD-10-CM | POA: Diagnosis not present

## 2017-11-01 DIAGNOSIS — G801 Spastic diplegic cerebral palsy: Secondary | ICD-10-CM | POA: Diagnosis not present

## 2017-11-01 DIAGNOSIS — G40209 Localization-related (focal) (partial) symptomatic epilepsy and epileptic syndromes with complex partial seizures, not intractable, without status epilepticus: Secondary | ICD-10-CM | POA: Diagnosis not present

## 2017-11-01 DIAGNOSIS — M4802 Spinal stenosis, cervical region: Secondary | ICD-10-CM | POA: Diagnosis not present

## 2017-11-02 ENCOUNTER — Encounter: Payer: Medicare Other | Admitting: Physical Medicine & Rehabilitation

## 2017-11-02 NOTE — Telephone Encounter (Signed)
Patient notified and she will call back to schedule the appointment when her help is available.

## 2017-11-06 DIAGNOSIS — N390 Urinary tract infection, site not specified: Secondary | ICD-10-CM | POA: Diagnosis not present

## 2017-11-06 DIAGNOSIS — G801 Spastic diplegic cerebral palsy: Secondary | ICD-10-CM | POA: Diagnosis not present

## 2017-11-06 DIAGNOSIS — G40209 Localization-related (focal) (partial) symptomatic epilepsy and epileptic syndromes with complex partial seizures, not intractable, without status epilepticus: Secondary | ICD-10-CM | POA: Diagnosis not present

## 2017-11-06 DIAGNOSIS — M4802 Spinal stenosis, cervical region: Secondary | ICD-10-CM | POA: Diagnosis not present

## 2017-11-06 DIAGNOSIS — G894 Chronic pain syndrome: Secondary | ICD-10-CM | POA: Diagnosis not present

## 2017-11-06 DIAGNOSIS — G35 Multiple sclerosis: Secondary | ICD-10-CM | POA: Diagnosis not present

## 2017-11-08 DIAGNOSIS — G40209 Localization-related (focal) (partial) symptomatic epilepsy and epileptic syndromes with complex partial seizures, not intractable, without status epilepticus: Secondary | ICD-10-CM | POA: Diagnosis not present

## 2017-11-08 DIAGNOSIS — G35 Multiple sclerosis: Secondary | ICD-10-CM | POA: Diagnosis not present

## 2017-11-08 DIAGNOSIS — N390 Urinary tract infection, site not specified: Secondary | ICD-10-CM | POA: Diagnosis not present

## 2017-11-08 DIAGNOSIS — G801 Spastic diplegic cerebral palsy: Secondary | ICD-10-CM | POA: Diagnosis not present

## 2017-11-08 DIAGNOSIS — G894 Chronic pain syndrome: Secondary | ICD-10-CM | POA: Diagnosis not present

## 2017-11-08 DIAGNOSIS — M4802 Spinal stenosis, cervical region: Secondary | ICD-10-CM | POA: Diagnosis not present

## 2017-11-10 ENCOUNTER — Encounter: Payer: Medicare Other | Attending: Physical Medicine & Rehabilitation | Admitting: Physical Medicine & Rehabilitation

## 2017-11-10 DIAGNOSIS — G40909 Epilepsy, unspecified, not intractable, without status epilepticus: Secondary | ICD-10-CM | POA: Insufficient documentation

## 2017-11-10 DIAGNOSIS — Z79899 Other long term (current) drug therapy: Secondary | ICD-10-CM | POA: Insufficient documentation

## 2017-11-10 DIAGNOSIS — G801 Spastic diplegic cerebral palsy: Secondary | ICD-10-CM | POA: Insufficient documentation

## 2017-11-10 DIAGNOSIS — G8929 Other chronic pain: Secondary | ICD-10-CM | POA: Insufficient documentation

## 2017-11-10 DIAGNOSIS — I890 Lymphedema, not elsewhere classified: Secondary | ICD-10-CM | POA: Insufficient documentation

## 2017-11-10 DIAGNOSIS — Z853 Personal history of malignant neoplasm of breast: Secondary | ICD-10-CM | POA: Insufficient documentation

## 2017-11-10 DIAGNOSIS — Z9221 Personal history of antineoplastic chemotherapy: Secondary | ICD-10-CM | POA: Insufficient documentation

## 2017-11-10 DIAGNOSIS — Z8249 Family history of ischemic heart disease and other diseases of the circulatory system: Secondary | ICD-10-CM | POA: Insufficient documentation

## 2017-11-10 DIAGNOSIS — G35 Multiple sclerosis: Secondary | ICD-10-CM | POA: Insufficient documentation

## 2017-11-10 DIAGNOSIS — Z8349 Family history of other endocrine, nutritional and metabolic diseases: Secondary | ICD-10-CM | POA: Insufficient documentation

## 2017-11-10 DIAGNOSIS — Z9011 Acquired absence of right breast and nipple: Secondary | ICD-10-CM | POA: Insufficient documentation

## 2017-11-10 DIAGNOSIS — Z981 Arthrodesis status: Secondary | ICD-10-CM | POA: Insufficient documentation

## 2017-11-13 DIAGNOSIS — M4802 Spinal stenosis, cervical region: Secondary | ICD-10-CM | POA: Diagnosis not present

## 2017-11-13 DIAGNOSIS — G894 Chronic pain syndrome: Secondary | ICD-10-CM | POA: Diagnosis not present

## 2017-11-13 DIAGNOSIS — G35 Multiple sclerosis: Secondary | ICD-10-CM | POA: Diagnosis not present

## 2017-11-13 DIAGNOSIS — N390 Urinary tract infection, site not specified: Secondary | ICD-10-CM | POA: Diagnosis not present

## 2017-11-13 DIAGNOSIS — G40209 Localization-related (focal) (partial) symptomatic epilepsy and epileptic syndromes with complex partial seizures, not intractable, without status epilepticus: Secondary | ICD-10-CM | POA: Diagnosis not present

## 2017-11-13 DIAGNOSIS — G801 Spastic diplegic cerebral palsy: Secondary | ICD-10-CM | POA: Diagnosis not present

## 2017-11-15 ENCOUNTER — Telehealth: Payer: Self-pay | Admitting: Family Medicine

## 2017-11-15 DIAGNOSIS — G894 Chronic pain syndrome: Secondary | ICD-10-CM | POA: Diagnosis not present

## 2017-11-15 DIAGNOSIS — M4802 Spinal stenosis, cervical region: Secondary | ICD-10-CM | POA: Diagnosis not present

## 2017-11-15 DIAGNOSIS — G35 Multiple sclerosis: Secondary | ICD-10-CM | POA: Diagnosis not present

## 2017-11-15 DIAGNOSIS — G40209 Localization-related (focal) (partial) symptomatic epilepsy and epileptic syndromes with complex partial seizures, not intractable, without status epilepticus: Secondary | ICD-10-CM | POA: Diagnosis not present

## 2017-11-15 DIAGNOSIS — N390 Urinary tract infection, site not specified: Secondary | ICD-10-CM | POA: Diagnosis not present

## 2017-11-15 DIAGNOSIS — G801 Spastic diplegic cerebral palsy: Secondary | ICD-10-CM | POA: Diagnosis not present

## 2017-11-15 NOTE — Telephone Encounter (Signed)
Copied from [place]. Topic: Quick Communication - See Telephone Encounter >> Nov 15, 2017 11:51 AM Antonieta Iba C wrote: CRM for notification. See Telephone encounter for: 11/15/17.   Aldrin w/ [facility] called in to request vo for home health pt   Frequency: 2 times a week for 4 more weeks.   Cb: [phone]

## 2017-11-15 NOTE — Telephone Encounter (Signed)
That's fine

## 2017-11-16 NOTE — Telephone Encounter (Signed)
Left detailed voicemail of verbal order

## 2017-11-17 ENCOUNTER — Encounter: Payer: Self-pay | Admitting: Physical Medicine & Rehabilitation

## 2017-11-17 ENCOUNTER — Encounter (HOSPITAL_BASED_OUTPATIENT_CLINIC_OR_DEPARTMENT_OTHER): Payer: Medicare Other | Admitting: Physical Medicine & Rehabilitation

## 2017-11-17 VITALS — BP 141/84 | HR 63

## 2017-11-17 DIAGNOSIS — Z9011 Acquired absence of right breast and nipple: Secondary | ICD-10-CM | POA: Diagnosis not present

## 2017-11-17 DIAGNOSIS — Z79899 Other long term (current) drug therapy: Secondary | ICD-10-CM | POA: Diagnosis not present

## 2017-11-17 DIAGNOSIS — N319 Neuromuscular dysfunction of bladder, unspecified: Secondary | ICD-10-CM

## 2017-11-17 DIAGNOSIS — G801 Spastic diplegic cerebral palsy: Secondary | ICD-10-CM | POA: Diagnosis not present

## 2017-11-17 DIAGNOSIS — G8220 Paraplegia, unspecified: Secondary | ICD-10-CM

## 2017-11-17 DIAGNOSIS — G40909 Epilepsy, unspecified, not intractable, without status epilepticus: Secondary | ICD-10-CM | POA: Diagnosis not present

## 2017-11-17 DIAGNOSIS — G35 Multiple sclerosis: Secondary | ICD-10-CM | POA: Diagnosis not present

## 2017-11-17 DIAGNOSIS — Z8349 Family history of other endocrine, nutritional and metabolic diseases: Secondary | ICD-10-CM | POA: Diagnosis not present

## 2017-11-17 DIAGNOSIS — I890 Lymphedema, not elsewhere classified: Secondary | ICD-10-CM | POA: Diagnosis not present

## 2017-11-17 DIAGNOSIS — Z8249 Family history of ischemic heart disease and other diseases of the circulatory system: Secondary | ICD-10-CM | POA: Diagnosis not present

## 2017-11-17 DIAGNOSIS — Z981 Arthrodesis status: Secondary | ICD-10-CM | POA: Diagnosis not present

## 2017-11-17 DIAGNOSIS — R269 Unspecified abnormalities of gait and mobility: Secondary | ICD-10-CM | POA: Diagnosis not present

## 2017-11-17 DIAGNOSIS — N3644 Muscular disorders of urethra: Secondary | ICD-10-CM | POA: Diagnosis not present

## 2017-11-17 DIAGNOSIS — G8929 Other chronic pain: Secondary | ICD-10-CM | POA: Diagnosis not present

## 2017-11-17 DIAGNOSIS — Z9221 Personal history of antineoplastic chemotherapy: Secondary | ICD-10-CM | POA: Diagnosis not present

## 2017-11-17 DIAGNOSIS — Z853 Personal history of malignant neoplasm of breast: Secondary | ICD-10-CM | POA: Diagnosis not present

## 2017-11-17 NOTE — Progress Notes (Signed)
Subjective:    Patient ID: Rebecca [name], female    DOB: 1960/02/26, 57 y.o.   MRN: [id number]  HPI Female, history of chronic pain, lymphedema bilateral lower extremities, right breast cancer, chemotherapy, seizure disorder, multiple sclerosis presents for follow up for MS.  Last clinic visit 09/08/17.  Husband present, who supplements history.  Since that time, pt went to the ED for UTI, prescribed abx, notes reviewed. She continues to have therapies 2/week. She is following up with Neurology. She continues to take Baclofen and Tizanidine. She stopped have lymphedema wraps. Denies falls. She does not have a follow up with Urology. She discussed Botox with therapists, but believed patient has more weakness than spasticity.   Pain Inventory Average Pain 6 Pain Right Now 0 My pain is intermittent, tingling and aching  In the last 24 hours, has pain interfered with the following? General activity 0 Relation with others 0 Enjoyment of life 0 What TIME of day is your pain at its worst? morning, night Sleep (in general) Good  Pain is worse with: sitting and inactivity Pain improves with: rest, pacing activities and medication Relief from Meds: 10  Mobility walk with assistance use a walker how many minutes can you walk? 15 ability to climb steps?  no do you drive?  no use a wheelchair transfers alone Do you have any goals in this area?  yes  Function not employed: date last employed . disabled: date disabled . I need assistance with the following:  household duties and shopping Do you have any goals in this area?  yes  Neuro/Psych bladder control problems weakness numbness tingling trouble walking  Prior Studies Any changes since last visit?  no  Physicians involved in your care Any changes since last visit?  no   Family History  Problem Relation Age of Onset  . Cancer Father        skin  . Heart disease Father   . Heart attack Father        heart attack in his  75's  . Thyroid disease Sister   . Ovarian cancer Cousin   . Breast cancer Maternal Aunt 60  . Breast cancer Maternal Grandmother 9  . Bladder Cancer Neg Hx   . Kidney cancer Neg Hx    Social History   Socioeconomic History  . Marital status: Married    Spouse name: don  . Number of children: 0  . Years of education: 12  . Highest education level: Not on file  Occupational History  . Occupation: disability  Social Needs  . Financial resource strain: Not on file  . Food insecurity:    Worry: Not on file    Inability: Not on file  . Transportation needs:    Medical: Not on file    Non-medical: Not on file  Tobacco Use  . Smoking status: Never Smoker  . Smokeless tobacco: Never Used  Substance and Sexual Activity  . Alcohol use: No  . Drug use: No  . Sexual activity: Not Currently    Partners: Male  Lifestyle  . Physical activity:    Days per week: Not on file    Minutes per session: Not on file  . Stress: Not on file  Relationships  . Social connections:    Talks on phone: Not on file    Gets together: Not on file    Attends religious service: Not on file    Active member of club or organization: Not on file  Attends meetings of clubs or organizations: Not on file    Relationship status: Not on file  Other Topics Concern  . Not on file  Social History Narrative   She is married. Recently moved back to [place] after being in [place] for some time. She is accompanied by her husband and aunt.   Never smoked. Never used alcohol.   Past Surgical History:  Procedure Laterality Date  . ANKLE SURGERY     Left  . ANKLE SURGERY    . ANTERIOR CERVICAL DECOMP/DISCECTOMY FUSION  11/17/2011   Procedure: ANTERIOR CERVICAL DECOMPRESSION/DISCECTOMY FUSION 2 LEVELS;  Surgeon: Erline Levine, MD;  Location: [facility];  Service: Neurosurgery;  Laterality: N/A;  Cervical Five-Six Six-Seven Anterior cervical decompression/diskectomy/fusion  . BREAST BIOPSY Right  12-31-13   invasive mammary  . BREAST SURGERY Right 02/03/2014   Right simple mastectomy with sentinel node biopsy.  . CHOLECYSTECTOMY    . COLONOSCOPY  2014  . Lower extremity venous Dopplers  Feb 27, 2013   No LE DVT  . MASTECTOMY Right 2015  . Port a cath insertion Right 01/19/2010  . PORT-A-CATH REMOVAL     right  . PORT-A-CATH REMOVAL Right 09/03/2013   Procedure: REMOVAL PORT-A-CATH;  Surgeon: Conrad Berrysburg, MD;  Location: [facility];  Service: Vascular;  Laterality: Right;  . TRANSTHORACIC ECHOCARDIOGRAM  03/2013; 02/2014   a) Normal LV size and function with EF 60-65%.; Cannot exclude bicuspid aortic valve with mild AS and mild AI.; b) Normal EF with normal wall motion and valve function x Mild MR. G2 DD. EF 60-65%. Tricuspid AoV  . UPPER GI ENDOSCOPY  2014   Past Medical History:  Diagnosis Date  . Aortic valve disease    Mild AS / AI - most recent Echo demonstrated tricuspid aortic valve.  . Bacterial endocarditis    History of .  Marland Kitchen Bilateral lower extremity edema    Noncardiac.  Chronic. LE Venous dopplers - negative for DVT.; Echocardiogram January 2016: Normal EF with normal wall motion and valve function. Only grade 1 diastolic dysfunction. EF 60-65%. Mild MR  . Breast cancer ([place]) 12-31-13   Right breast, 12:00, 1.5 cm, T1c,N0 invasive mammary carcinoma, triple negative. --> Rx with Chemo  . Cervical stenosis of spine   . Herpes zoster   . IBS (irritable bowel syndrome)   . Lymphedema    has legs wrapped at [facility]  . Multiple sclerosis ([place]) 2001   Walks from room to room @ home; but Wheelchair when going out.  Marland Kitchen Neuromuscular disorder ([place])    MS  . Seizures ([place])    Takes Keppra  . Syncope and collapse    BP (!) 141/84   Pulse 63   SpO2 98%   Opioid Risk Score:   Fall Risk Score:  `1  Depression screen PHQ 2/9  Depression screen [facility] 2/9 09/08/2017 06/23/2017 02/09/2017 10/28/2016 07/28/2016 07/15/2016 06/27/2016  Decreased Interest 0 0 0 - 0 2 0  Down, Depressed,  Hopeless 0 0 0 0 0 0 0  PHQ - 2 Score 0 0 0 0 0 2 0  Altered sleeping - - - - - 0 -  Tired, decreased energy - - - - - 0 -  Change in appetite - - - - - 0 -  Feeling bad or failure about yourself  - - - - - 0 -  Trouble concentrating - - - - - 0 -  Moving slowly or fidgety/restless - - - - -  0 -  Suicidal thoughts - - - - - 0 -  PHQ-9 Score - - - - - 2 -  Difficult doing work/chores - - - - - Somewhat difficult -  Some recent data might be hidden    Review of Systems  HENT: Negative.   Eyes: Negative.   Respiratory: Negative.   Cardiovascular: Positive for leg swelling.  Gastrointestinal: Negative.   Endocrine: Negative.   Genitourinary: Positive for difficulty urinating.  Musculoskeletal: Positive for arthralgias and gait problem.  Allergic/Immunologic: Negative.   Neurological: Positive for weakness and numbness.       Tingling  Hematological: Negative.   Psychiatric/Behavioral: Negative.   All other systems reviewed and are negative.      Objective:   Physical Exam Gen: Well developed. NAD. Vital signs reviewed. Head: Normocephalic. Atraumatic. Eyes: EOMI. No discharge Cardiovascular: RRR. No JVD.  Respiratory: CTA bilaterally. unlabored. GI: Soft. Bowel sounds are normal.  Musc: Edema b/l LE.  Right heel cord contracture Neurological. Alert and oriented  Fair awareness of deficits.  Motor: RUE: 4+-5/5 proximal to distal  LUE: 4+-5/5 proximal to distal RLE: 2/5 HF, 3+/5 KE, 2-/5 ADF/PF  LLE: 3+/5 HF, 4-/5 KE, 4-/5 ADF/PF MAS: Predominantly in right ankle plantarflexors 3/4 Skin. Chronic lymphedema lower extremities Psych: pleasant and cooperative    Assessment & Plan:  57 year old right-handed female, history of chronic pain, lymphedema bilateral lower extremities, right breast cancer, chemotherapy, seizure disorder, multiple sclerosis presents for follow up for MS.   1. MS   Cont therapies   Cont meds   Cont follow up with Neurology  2.  Spastic  paraparesis:   Cont ROM  Cont Baclofen  Cont tizanidine 2mg  BID PRN  Good benefit with combination  No need for Botox at this time, would consider injection to right gastrocs after discussion with therapies and bracing if tone hindrance to function.  Discussed medications and bracing options for patient. Discussed calling to make an appointment if plan to proceed with Botulinum toxin injection.  3. Chronic lymphedema  Encouraged wraps and massage  States wound care will only address if open wounds present  Encouraged cont pumps and elevation, encouraged compliance  4. Gait abnormality  Cont therapies  Cont walker/wheelchair for safety  5. Neurogenic bladder with spastic bladder  With recurrent UTIs  Cont meds  Follow up Urology, needs appointment, discussed follow up

## 2017-11-20 DIAGNOSIS — N390 Urinary tract infection, site not specified: Secondary | ICD-10-CM | POA: Diagnosis not present

## 2017-11-20 DIAGNOSIS — G801 Spastic diplegic cerebral palsy: Secondary | ICD-10-CM | POA: Diagnosis not present

## 2017-11-20 DIAGNOSIS — G35 Multiple sclerosis: Secondary | ICD-10-CM | POA: Diagnosis not present

## 2017-11-20 DIAGNOSIS — G40209 Localization-related (focal) (partial) symptomatic epilepsy and epileptic syndromes with complex partial seizures, not intractable, without status epilepticus: Secondary | ICD-10-CM | POA: Diagnosis not present

## 2017-11-20 DIAGNOSIS — G894 Chronic pain syndrome: Secondary | ICD-10-CM | POA: Diagnosis not present

## 2017-11-20 DIAGNOSIS — M4802 Spinal stenosis, cervical region: Secondary | ICD-10-CM | POA: Diagnosis not present

## 2017-11-24 DIAGNOSIS — G894 Chronic pain syndrome: Secondary | ICD-10-CM | POA: Diagnosis not present

## 2017-11-24 DIAGNOSIS — N390 Urinary tract infection, site not specified: Secondary | ICD-10-CM | POA: Diagnosis not present

## 2017-11-24 DIAGNOSIS — G801 Spastic diplegic cerebral palsy: Secondary | ICD-10-CM | POA: Diagnosis not present

## 2017-11-24 DIAGNOSIS — M4802 Spinal stenosis, cervical region: Secondary | ICD-10-CM | POA: Diagnosis not present

## 2017-11-24 DIAGNOSIS — G35 Multiple sclerosis: Secondary | ICD-10-CM | POA: Diagnosis not present

## 2017-11-24 DIAGNOSIS — G40209 Localization-related (focal) (partial) symptomatic epilepsy and epileptic syndromes with complex partial seizures, not intractable, without status epilepticus: Secondary | ICD-10-CM | POA: Diagnosis not present

## 2017-11-28 DIAGNOSIS — G40209 Localization-related (focal) (partial) symptomatic epilepsy and epileptic syndromes with complex partial seizures, not intractable, without status epilepticus: Secondary | ICD-10-CM | POA: Diagnosis not present

## 2017-11-28 DIAGNOSIS — G801 Spastic diplegic cerebral palsy: Secondary | ICD-10-CM | POA: Diagnosis not present

## 2017-11-28 DIAGNOSIS — M4802 Spinal stenosis, cervical region: Secondary | ICD-10-CM | POA: Diagnosis not present

## 2017-11-28 DIAGNOSIS — N390 Urinary tract infection, site not specified: Secondary | ICD-10-CM | POA: Diagnosis not present

## 2017-11-28 DIAGNOSIS — G35 Multiple sclerosis: Secondary | ICD-10-CM | POA: Diagnosis not present

## 2017-11-28 DIAGNOSIS — G894 Chronic pain syndrome: Secondary | ICD-10-CM | POA: Diagnosis not present

## 2017-11-29 ENCOUNTER — Telehealth: Payer: Self-pay

## 2017-11-29 NOTE — Telephone Encounter (Signed)
I'm not sure why this came to me Please fax again if he didn't get it

## 2017-11-29 NOTE — Telephone Encounter (Signed)
Re-faxed.

## 2017-11-29 NOTE — Telephone Encounter (Signed)
Copied from [place] [id number]. Topic: General - Other >> Nov 28, 2017  4:33 PM Vernona Rieger wrote: Reason for CRM:   Darius called from [facility] and stated that he faxed over orders for a re-certification [id number]/[id number] for home health :: The Document Numbers are [id number] top left corner  Verbal order for physical therapy :: Document Numbers are [id number] left side under the bar code He faxed these orders on 10/3 he needs these signed and faxed back by Dr Sanda Klein. Fax number [phone] attention Darius

## 2017-11-30 ENCOUNTER — Other Ambulatory Visit: Payer: Self-pay | Admitting: Family Medicine

## 2017-11-30 DIAGNOSIS — G40209 Localization-related (focal) (partial) symptomatic epilepsy and epileptic syndromes with complex partial seizures, not intractable, without status epilepticus: Secondary | ICD-10-CM | POA: Diagnosis not present

## 2017-11-30 DIAGNOSIS — G35 Multiple sclerosis: Secondary | ICD-10-CM | POA: Diagnosis not present

## 2017-11-30 DIAGNOSIS — N390 Urinary tract infection, site not specified: Secondary | ICD-10-CM | POA: Diagnosis not present

## 2017-11-30 DIAGNOSIS — M4802 Spinal stenosis, cervical region: Secondary | ICD-10-CM | POA: Diagnosis not present

## 2017-11-30 DIAGNOSIS — G801 Spastic diplegic cerebral palsy: Secondary | ICD-10-CM | POA: Diagnosis not present

## 2017-11-30 DIAGNOSIS — G894 Chronic pain syndrome: Secondary | ICD-10-CM | POA: Diagnosis not present

## 2017-11-30 NOTE — Telephone Encounter (Signed)
Copied from [place] [id number]. Topic: Quick Communication - See Telephone Encounter >> Nov 30, 2017  2:12 PM Alfredia Ferguson R wrote: [facility] is calling in requesting Voltaren Gel 1% for patient due to pain.  [id number] - [place], [place] - [address] [phone] (Phone) [phone] (Fax)

## 2017-11-30 NOTE — Telephone Encounter (Signed)
I'll need to know WHERE the gel is supposed to go for the instructions please (knees, hands, etc.) and what the diagnosis is Also, is this going to REPLACE the ibuprofen? I would not want her on both

## 2017-12-01 NOTE — Telephone Encounter (Signed)
Left detailed voicemail regarding this info

## 2017-12-04 NOTE — Telephone Encounter (Signed)
Spoke with patient. She stated that this will be for her MS. Stated that she will have [facility] call with specific dx and instructions. I also advised pt to discontinue ibuprofen once she starts using the cream as Dr. Sanda Klein advised against using both.

## 2017-12-05 ENCOUNTER — Inpatient Hospital Stay: Payer: Medicare Other

## 2017-12-06 DIAGNOSIS — N390 Urinary tract infection, site not specified: Secondary | ICD-10-CM | POA: Diagnosis not present

## 2017-12-06 DIAGNOSIS — M4802 Spinal stenosis, cervical region: Secondary | ICD-10-CM | POA: Diagnosis not present

## 2017-12-06 DIAGNOSIS — G40209 Localization-related (focal) (partial) symptomatic epilepsy and epileptic syndromes with complex partial seizures, not intractable, without status epilepticus: Secondary | ICD-10-CM | POA: Diagnosis not present

## 2017-12-06 DIAGNOSIS — G894 Chronic pain syndrome: Secondary | ICD-10-CM | POA: Diagnosis not present

## 2017-12-06 DIAGNOSIS — G801 Spastic diplegic cerebral palsy: Secondary | ICD-10-CM | POA: Diagnosis not present

## 2017-12-06 DIAGNOSIS — G35 Multiple sclerosis: Secondary | ICD-10-CM | POA: Diagnosis not present

## 2017-12-07 MED ORDER — DICLOFENAC SODIUM 1 % TD GEL: 2.0000 g | Freq: Two times a day (BID) | TRANSDERMAL | 1 refills | Status: DC | PRN

## 2017-12-07 NOTE — Telephone Encounter (Signed)
Horris Latino the therapist called back dx is joint pain due to fibromyalgia.  Apply to Ankles and knees.  They wanted to make sure that voltaren gel did not contain fentanyl because she is allergic.

## 2017-12-08 DIAGNOSIS — M4802 Spinal stenosis, cervical region: Secondary | ICD-10-CM | POA: Diagnosis not present

## 2017-12-08 DIAGNOSIS — G801 Spastic diplegic cerebral palsy: Secondary | ICD-10-CM | POA: Diagnosis not present

## 2017-12-08 DIAGNOSIS — G40209 Localization-related (focal) (partial) symptomatic epilepsy and epileptic syndromes with complex partial seizures, not intractable, without status epilepticus: Secondary | ICD-10-CM | POA: Diagnosis not present

## 2017-12-08 DIAGNOSIS — G894 Chronic pain syndrome: Secondary | ICD-10-CM | POA: Diagnosis not present

## 2017-12-08 DIAGNOSIS — N390 Urinary tract infection, site not specified: Secondary | ICD-10-CM | POA: Diagnosis not present

## 2017-12-08 DIAGNOSIS — G35 Multiple sclerosis: Secondary | ICD-10-CM | POA: Diagnosis not present

## 2017-12-11 DIAGNOSIS — G35 Multiple sclerosis: Secondary | ICD-10-CM | POA: Diagnosis not present

## 2017-12-11 DIAGNOSIS — N390 Urinary tract infection, site not specified: Secondary | ICD-10-CM | POA: Diagnosis not present

## 2017-12-11 DIAGNOSIS — G801 Spastic diplegic cerebral palsy: Secondary | ICD-10-CM | POA: Diagnosis not present

## 2017-12-11 DIAGNOSIS — M4802 Spinal stenosis, cervical region: Secondary | ICD-10-CM | POA: Diagnosis not present

## 2017-12-11 DIAGNOSIS — G894 Chronic pain syndrome: Secondary | ICD-10-CM | POA: Diagnosis not present

## 2017-12-11 DIAGNOSIS — G40209 Localization-related (focal) (partial) symptomatic epilepsy and epileptic syndromes with complex partial seizures, not intractable, without status epilepticus: Secondary | ICD-10-CM | POA: Diagnosis not present

## 2017-12-12 ENCOUNTER — Inpatient Hospital Stay: Payer: Medicare Other | Attending: Oncology

## 2017-12-12 DIAGNOSIS — Z452 Encounter for adjustment and management of vascular access device: Secondary | ICD-10-CM | POA: Insufficient documentation

## 2017-12-12 DIAGNOSIS — Z853 Personal history of malignant neoplasm of breast: Secondary | ICD-10-CM | POA: Insufficient documentation

## 2017-12-12 DIAGNOSIS — C50911 Malignant neoplasm of unspecified site of right female breast: Secondary | ICD-10-CM

## 2017-12-12 MED ORDER — HEPARIN SOD (PORK) LOCK FLUSH 100 UNIT/ML IV SOLN
500.0000 [IU] | Freq: Once | INTRAVENOUS | Status: AC
  Administered 2017-12-12: 500 [IU] via INTRAVENOUS
  Filled 2017-12-12: qty 5

## 2017-12-12 MED ORDER — SODIUM CHLORIDE 0.9% FLUSH
10.0000 mL | INTRAVENOUS | Status: DC | PRN
  Administered 2017-12-12: 10 mL via INTRAVENOUS
  Filled 2017-12-12: qty 10

## 2017-12-13 DIAGNOSIS — G801 Spastic diplegic cerebral palsy: Secondary | ICD-10-CM | POA: Diagnosis not present

## 2017-12-13 DIAGNOSIS — G894 Chronic pain syndrome: Secondary | ICD-10-CM | POA: Diagnosis not present

## 2017-12-13 DIAGNOSIS — N390 Urinary tract infection, site not specified: Secondary | ICD-10-CM | POA: Diagnosis not present

## 2017-12-13 DIAGNOSIS — G35 Multiple sclerosis: Secondary | ICD-10-CM | POA: Diagnosis not present

## 2017-12-13 DIAGNOSIS — G40209 Localization-related (focal) (partial) symptomatic epilepsy and epileptic syndromes with complex partial seizures, not intractable, without status epilepticus: Secondary | ICD-10-CM | POA: Diagnosis not present

## 2017-12-13 DIAGNOSIS — M4802 Spinal stenosis, cervical region: Secondary | ICD-10-CM | POA: Diagnosis not present

## 2017-12-15 ENCOUNTER — Other Ambulatory Visit: Payer: Self-pay | Admitting: Nurse Practitioner

## 2017-12-15 NOTE — Telephone Encounter (Signed)
Please send over. She was having her legs massage with a topical cream and she did not need the Ibuprofen during that time. She is no longer having her legs massaged and would like to start back taking the Ibuprofen.

## 2017-12-26 ENCOUNTER — Ambulatory Visit (INDEPENDENT_AMBULATORY_CARE_PROVIDER_SITE_OTHER): Payer: Medicare Other | Admitting: Family Medicine

## 2017-12-26 ENCOUNTER — Ambulatory Visit: Payer: Self-pay

## 2017-12-26 ENCOUNTER — Encounter: Payer: Self-pay | Admitting: Family Medicine

## 2017-12-26 VITALS — BP 118/70 | HR 77 | Temp 97.7°F | Ht 64.0 in

## 2017-12-26 DIAGNOSIS — Z23 Encounter for immunization: Secondary | ICD-10-CM | POA: Diagnosis not present

## 2017-12-26 DIAGNOSIS — I890 Lymphedema, not elsewhere classified: Secondary | ICD-10-CM | POA: Diagnosis not present

## 2017-12-26 DIAGNOSIS — N319 Neuromuscular dysfunction of bladder, unspecified: Secondary | ICD-10-CM | POA: Diagnosis not present

## 2017-12-26 DIAGNOSIS — N390 Urinary tract infection, site not specified: Secondary | ICD-10-CM | POA: Diagnosis not present

## 2017-12-26 DIAGNOSIS — S025XXA Fracture of tooth (traumatic), initial encounter for closed fracture: Secondary | ICD-10-CM

## 2017-12-26 DIAGNOSIS — R6884 Jaw pain: Secondary | ICD-10-CM

## 2017-12-26 DIAGNOSIS — G35 Multiple sclerosis: Secondary | ICD-10-CM

## 2017-12-26 MED ORDER — PREVAIL WET WIPES MISC: 1.0000 | Freq: Every day | 11 refills | Status: DC | PRN

## 2017-12-26 MED ORDER — ASSURANCE VINYL EXAM GLOVES MISC: 11 refills | Status: DC

## 2017-12-26 NOTE — Patient Instructions (Signed)
1. Thank You for allowing the CCM (Chronic Care Management) Team to assist you with your healthcare goals!! We look forward to meeting you on 11/26.19 at 1:30 PM.  2. Please bring all of your medications with you to your appointment.  3. Contact the CCM Team if you have any question or need to reschedule your initial visit.  CCM (Chronic Care Management) Team   Trish Fountain RN, BSN Nurse Care Coordinator  [phone]  Ruben Reason PharmD  Clinical Pharmacist  [phone]    Rebecca [name] was given information about Chronic Care Management services today including:  1. CCM service includes personalized support from designated clinical staff supervised by her physician, including individualized plan of care and coordination with other care providers 2. 24/7 contact phone numbers for assistance for urgent and routine care needs. 3. Service will only be billed when office clinical staff spend 20 minutes or more in a month to coordinate care. 4. Only one practitioner may furnish and bill the service in a calendar month. 5. The patient may stop CCM services at any time (effective at the end of the month) by phone call to the office staff. 6. The patient will be responsible for cost sharing (co-pay) of up to 20% of the service fee (after annual deductible is met).  Patient agreed to services and verbal consent obtained.  The patient verbalized understanding of instructions provided today and declined a print copy of patient instruction materials.

## 2017-12-26 NOTE — Chronic Care Management (AMB) (Signed)
  Chronic Care Management   Note  12/26/2017 Name: Rebecca [name] MRN: [id number] DOB: 09-10-1960    [name] is a 57 y.o. year old female who sees Lada, Satira Anis, MD for primary care. Dr. Sanda Klein asked the CCM team to consult the patient for assistance with chronic disease management and care coordination related to MS, Lymphedema, Neurogenic bladder. Referral was placed today. The CCM Team met with [name] and her husband face to face to discuss CCM services.    [name] was given information about Chronic Care Management services today including:  1. CCM service includes personalized support from designated clinical staff supervised by her physician, including individualized plan of care and coordination with other care providers 2. 24/7 contact phone numbers for assistance for urgent and routine care needs. 3. Service will only be billed when office clinical staff spend 20 minutes or more in a month to coordinate care. 4. Only one practitioner may furnish and bill the service in a calendar month. 5. The patient may stop CCM services at any time (effective at the end of the month) by phone call to the office staff. 6. The patient will be responsible for cost sharing (co-pay) of up to 20% of the service fee (after annual deductible is met).  Patient agreed to services and verbal consent obtained.  Plan: Initial face to face with the CCM Team scheduled for Tuesday 01/02/18 at 1:30 pm.    Portia E. Rollene Rotunda, RN, BSN Nurse Care Coordinator [facility] Care Management  [phone]

## 2017-12-26 NOTE — Assessment & Plan Note (Signed)
With neurogenic bladder; refer to urologist

## 2017-12-26 NOTE — Assessment & Plan Note (Signed)
Limited ability to use lymphedema pump because of bladder situation; would like to have lymphedema therapist come back out to the home, if there is any way to get this covered Jackquline [place])

## 2017-12-26 NOTE — Progress Notes (Signed)
BP 118/70   Pulse 77   Temp 97.7 F (36.5 C)   Ht 5\' 4"  (1.626 m)   SpO2 96%   BMI 30.90 kg/m    Subjective:    Patient ID: Rebecca [name], female    DOB: 1960-07-08, 57 y.o.   MRN: [id number]  HPI: Rebecca [name] is a 57 y.o. female  Chief Complaint  Patient presents with  . Follow-up  . Ear Pain    Left ear pain  . Leg Pain    increased swelling    HPI Here for f/u She has had a few urinary tract infections; was seeing urologist, but would  No urine odor now; she has MS and neurogenic bladder She can usually tell when she smells the strong urine and has frequency and that's when she has a UTI; would like to do self-caths to alleviate the pressure in her bladder; she talked to her therapist about self-cath as well; as soon as she sits down, she says it never fails; gets briefs from [employer]; has "tons of accidents" and going through briefs  She is having left ear pain; sharp pain; goes up the side of the face and in front of the ear; happened a few mintus ago before I came in; comes up the side, something that happens quite a bit; one month; not with eating; right at the back of the jaw; just a few seconds; no change in hearing; can feel when she opens the mouth wide; had ears cleaned last time here; no abscesses but has a couple of cracked teeth on the bottom  She is having increasing swelling in her left leg; she used to have a lady come and massage her leg; she gets pain in the left thigh; has to put leg down on the ground at night to get pain from hurting; rusn up the lateral side up to the hip joint; no injuries; no change in the neurologic condition; no redness and no rash; they have looked; the lymphedema therapist had to stop because of insurance reasons  Depression screen [facility] 2/9 12/26/2017 09/08/2017 06/23/2017 02/09/2017 10/28/2016  Decreased Interest 0 0 0 0 -  Down, Depressed, Hopeless 0 0 0 0 0  PHQ - 2 Score 0 0 0 0 0  Altered sleeping 0 - - - -  Tired, decreased  energy 0 - - - -  Change in appetite 0 - - - -  Feeling bad or failure about yourself  0 - - - -  Trouble concentrating 0 - - - -  Moving slowly or fidgety/restless 0 - - - -  Suicidal thoughts 0 - - - -  PHQ-9 Score 0 - - - -  Difficult doing work/chores Not difficult at all - - - -  Some recent data might be hidden   Fall Risk  12/26/2017 11/17/2017 09/08/2017 06/23/2017 02/09/2017  Falls in the past year? 1 No No No Yes  Number falls in past yr: 1 - - - 2 or more  Injury with Fall? 0 - - - No  Risk Factor Category  - - - - High Fall Risk  Risk for fall due to : - - - - Impaired balance/gait    Relevant past medical, surgical, family and social history reviewed Past Medical History:  Diagnosis Date  . Aortic valve disease    Mild AS / AI - most recent Echo demonstrated tricuspid aortic valve.  . Bacterial endocarditis    History of .  Marland Kitchen  Bilateral lower extremity edema    Noncardiac.  Chronic. LE Venous dopplers - negative for DVT.; Echocardiogram January 2016: Normal EF with normal wall motion and valve function. Only grade 1 diastolic dysfunction. EF 60-65%. Mild MR  . Breast cancer ([place]) 12-31-13   Right breast, 12:00, 1.5 cm, T1c,N0 invasive mammary carcinoma, triple negative. --> Rx with Chemo  . Cervical stenosis of spine   . Herpes zoster   . IBS (irritable bowel syndrome)   . Lymphedema    [facility] legs [facility] at [facility]  . Multiple sclerosis ([place]) 2001   Walks from room to room @ home; but Wheelchair when going out.  Marland Kitchen Neuromuscular disorder ([place])    MS  . Seizures ([place])    Takes Keppra  . Syncope and collapse    Past Surgical History:  Procedure Laterality Date  . ANKLE SURGERY     Left  . ANKLE SURGERY    . ANTERIOR CERVICAL DECOMP/DISCECTOMY FUSION  11/17/2011   Procedure: ANTERIOR CERVICAL DECOMPRESSION/DISCECTOMY FUSION 2 LEVELS;  Surgeon: Erline Levine, MD;  Location: [place] [facility];  Service: Neurosurgery;  Laterality: N/A;  Cervical Five-Six Six-Seven Anterior  cervical decompression/diskectomy/fusion  . BREAST BIOPSY Right 12-31-13   invasive mammary  . BREAST SURGERY Right 02/03/2014   Right simple mastectomy with sentinel node biopsy.  . CHOLECYSTECTOMY    . COLONOSCOPY  2014  . Lower extremity venous Dopplers  Feb 27, 2013   No LE DVT  . MASTECTOMY Right 2015  . Port a cath insertion Right 01/19/2010  . PORT-A-CATH REMOVAL     right  . PORT-A-CATH REMOVAL Right 09/03/2013   Procedure: REMOVAL PORT-A-CATH;  Surgeon: Conrad [place], MD;  Location: [place];  Service: Vascular;  Laterality: Right;  . TRANSTHORACIC ECHOCARDIOGRAM  03/2013; 02/2014   a) Normal LV size and function with EF 60-65%.; Cannot exclude bicuspid aortic valve with mild AS and mild AI.; b) Normal EF with normal wall motion and valve function x Mild MR. G2 DD. EF 60-65%. Tricuspid AoV  . UPPER GI ENDOSCOPY  2014   Family History  Problem Relation Age of Onset  . Cancer Father        skin  . Heart disease Father   . Heart attack Father        heart attack in his 64's  . Thyroid disease Sister   . Ovarian cancer Cousin   . Breast cancer Maternal Aunt 60  . Breast cancer Maternal Grandmother 67  . Bladder Cancer Neg Hx   . Kidney cancer Neg Hx    Social History   Tobacco Use  . Smoking status: Never Smoker  . Smokeless tobacco: Never Used  Substance Use Topics  . Alcohol use: No  . Drug use: No     [facility] Visit from 12/26/2017 in [facility]  AUDIT-C Score  0      Interim medical history since last visit reviewed. Allergies and medications reviewed  Review of Systems Per HPI unless specifically indicated above     Objective:    BP 118/70   Pulse 77   Temp 97.7 F (36.5 C)   Ht 5\' 4"  (1.626 m)   SpO2 96%   BMI 30.90 kg/m   Wt Readings from Last 3 Encounters:  10/27/17 180 lb (81.6 kg)  10/17/17 175 lb (79.4 kg)  09/12/17 170 lb (77.1 kg)    Physical Exam  Constitutional: She appears well-developed and well-nourished. No  distress.  Middle-aged female no distress;  seated in wheelchair  HENT:  Head: Normocephalic and atraumatic.  Right Ear: Tympanic membrane and ear canal normal. Tympanic membrane is not erythematous. No middle ear effusion.  Left Ear: Tympanic membrane and ear canal normal. There is tenderness. Tympanic membrane is not erythematous.  No middle ear effusion.  Ears:  Eyes: EOM are normal. No scleral icterus.  Neck: No thyromegaly present.  Cardiovascular: Normal rate, regular rhythm and normal heart sounds.  No murmur heard. Pulmonary/Chest: Effort normal and breath sounds normal. No respiratory distress. She has no wheezes.  Abdominal: Soft. Bowel sounds are normal. She exhibits no distension.  Musculoskeletal: She exhibits edema (bilateral lymphedema; mild erythema medially both legs, left slightly more than right, just anteromedial aspect; no palpable cords; rest of legs NOT red or tender).       Left hip: She exhibits decreased strength and tenderness. She exhibits no bony tenderness, no swelling and no deformity.       Legs: Tender along the the LEFT ITB  Lymphadenopathy:       Head (right side): No preauricular and no posterior auricular adenopathy present.       Head (left side): No preauricular and no posterior auricular adenopathy present.    She has no cervical adenopathy.  Neurological: She is alert.  Too weak to stand from wheelchair in room for thorough leg/hip exam; examined seated  Skin: Skin is warm and dry. She is not diaphoretic. No pallor.  Psychiatric: She has a normal mood and affect. Her behavior is normal. Judgment and thought content normal. Her mood appears not anxious. She does not exhibit a depressed mood.    Results for orders placed or performed during the hospital encounter of 10/27/17  Urine Culture  Result Value Ref Range   Specimen Description      URINE, RANDOM Performed at [facility], [address] [place], [place] [address]    Special  Requests      NONE Performed at [facility], [address] [place], [place] [address]    Culture >=100,000 COLONIES/mL SERRATIA MARCESCENS (A)    Report Status 10/30/2017 FINAL    Organism ID, Bacteria SERRATIA MARCESCENS (A)       Susceptibility   Serratia marcescens - MIC*    CEFAZOLIN >=64 RESISTANT Resistant     CEFTRIAXONE <=1 SENSITIVE Sensitive     CIPROFLOXACIN 1 SENSITIVE Sensitive     GENTAMICIN <=1 SENSITIVE Sensitive     NITROFURANTOIN >=512 RESISTANT Resistant     TRIMETH/SULFA 80 RESISTANT Resistant     * >=100,000 COLONIES/mL SERRATIA MARCESCENS  Basic metabolic panel  Result Value Ref Range   Sodium 141 135 - 145 mmol/L   Potassium 3.9 3.5 - 5.1 mmol/L   Chloride 106 98 - 111 mmol/L   CO2 28 22 - 32 mmol/L   Glucose, Bld 110 (H) 70 - 99 mg/dL   BUN 16 6 - 20 mg/dL   Creatinine, Ser 0.55 0.44 - 1.00 mg/dL   Calcium 9.5 8.9 - 10.3 mg/dL   GFR calc non Af Amer >60 >60 mL/min   GFR calc Af Amer >60 >60 mL/min   Anion gap 7 5 - 15  CBC  Result Value Ref Range   WBC 4.1 3.6 - 11.0 K/uL   RBC 4.03 3.80 - 5.20 MIL/uL   Hemoglobin 12.7 12.0 - 16.0 g/dL   HCT 36.9 35.0 - 47.0 %   MCV 91.4 80.0 - 100.0 fL   MCH 31.6 26.0 - 34.0 pg  MCHC 34.5 32.0 - 36.0 g/dL   RDW 13.0 11.5 - 14.5 %   Platelets 158 150 - 440 K/uL  Urinalysis, Complete w Microscopic  Result Value Ref Range   Color, Urine YELLOW (A) YELLOW   APPearance CLOUDY (A) CLEAR   Specific Gravity, Urine 1.015 1.005 - 1.030   pH 5.0 5.0 - 8.0   Glucose, UA NEGATIVE NEGATIVE mg/dL   Hgb urine dipstick NEGATIVE NEGATIVE   Bilirubin Urine NEGATIVE NEGATIVE   Ketones, ur 5 (A) NEGATIVE mg/dL   Protein, ur NEGATIVE NEGATIVE mg/dL   Nitrite NEGATIVE NEGATIVE   Leukocytes, UA LARGE (A) NEGATIVE   RBC / HPF 0-5 0 - 5 RBC/hpf   WBC, UA >50 (H) 0 - 5 WBC/hpf   Bacteria, UA RARE (A) NONE SEEN   Squamous Epithelial / LPF 0-5 0 - 5   Mucus PRESENT       Assessment & Plan:   Problem List Items  Addressed This Visit      Nervous and Auditory   MS (multiple sclerosis) ([place])    With neurogenic bladder; refer to urologist      Relevant Orders   Ambulatory referral to Urology   Ambulatory referral to Connected Care     Other   Neurogenic bladder   Relevant Orders   Ambulatory referral to Urology   Ambulatory referral to Connected Care   Lymphedema    Limited ability to use lymphedema pump because of bladder situation; would like to have lymphedema therapist come back out to the home, if there is any way to get this covered Jackquline [place])      Relevant Orders   Ambulatory referral to Connected Care    Other Visit Diagnoses    Urinary tract infection, recurrent    -  Primary   Relevant Orders   Ambulatory referral to Urology   Ambulatory referral to Connected Care   Flu vaccine need       Relevant Orders   Flu Vaccine QUAD 36+ mos IM (Completed)   Jaw pain, non-TMJ       suspect nerve irritation; will try gentle heat; encouraged patient to see dentist   Closed fracture of tooth, initial encounter       Relevant Orders   Ambulatory referral to Connected Care       Follow up plan: Return in about 3 months (around 03/28/2018).  An after-visit summary was printed and given to the patient at [place].  Please see the patient instructions which may contain other information and recommendations beyond what is mentioned above in the assessment and plan.  Meds ordered this encounter  Medications  . Incontinence Supply Disposable (PREVAIL WET WIPES) MISC    Sig: Apply 1 each topically daily as needed. (dx neurogenic bladder N31.9, LON 99 months)    Dispense:  96 each    Refill:  11  . Disposable Gloves (ASSURANCE VINYL EXAM GLOVES) MISC    Sig: Neurogenic bladder; MS; LON 99 months; for use for personal hygiene    Dispense:  50 each    Refill:  11    Orders Placed This Encounter  Procedures  . Flu Vaccine QUAD 36+ mos IM  . Ambulatory referral to Urology  . Ambulatory  referral to Connected Care

## 2017-12-26 NOTE — Patient Instructions (Signed)
We'll have the Care Team assist you Let us know how we can help We'll have you see the urologist

## 2018-01-02 ENCOUNTER — Ambulatory Visit: Payer: Medicare Other

## 2018-01-02 ENCOUNTER — Telehealth: Payer: Self-pay | Admitting: Family Medicine

## 2018-01-02 NOTE — Telephone Encounter (Signed)
Created [facility] Referral for pt for [employer] Referral. Will follow up with patient on Monday December 4th after organizations have determined eligibility, completed their outreach, & updated the referral.  [place] [phone]

## 2018-01-08 ENCOUNTER — Telehealth: Payer: Self-pay | Admitting: Family Medicine

## 2018-01-08 NOTE — Telephone Encounter (Signed)
See notes of referral

## 2018-01-09 ENCOUNTER — Ambulatory Visit: Payer: Self-pay | Admitting: Pharmacist

## 2018-01-09 DIAGNOSIS — I890 Lymphedema, not elsewhere classified: Secondary | ICD-10-CM

## 2018-01-09 DIAGNOSIS — N319 Neuromuscular dysfunction of bladder, unspecified: Secondary | ICD-10-CM

## 2018-01-09 DIAGNOSIS — G35 Multiple sclerosis: Secondary | ICD-10-CM

## 2018-01-09 NOTE — Chronic Care Management (AMB) (Signed)
  Chronic Care Management   Note  01/09/2018 Name: Rebecca [name] MRN: [id number] DOB: 1960-06-10  Follow up call placed to [name] to reschedule cancelled CCM initial office visit last week.  Was unable to reach patient via telephone today and have left HIPAA compliant voicemail asking patient to return my call. (unsuccessful outreach #1).  Plan: Will follow-up within 3-5  business days via telephone.   Ruben Reason, PharmD Clinical Pharmacist [facility] [phone]

## 2018-01-12 ENCOUNTER — Ambulatory Visit: Payer: Self-pay | Admitting: Pharmacist

## 2018-01-12 DIAGNOSIS — N319 Neuromuscular dysfunction of bladder, unspecified: Secondary | ICD-10-CM

## 2018-01-12 DIAGNOSIS — I890 Lymphedema, not elsewhere classified: Secondary | ICD-10-CM

## 2018-01-12 DIAGNOSIS — G35 Multiple sclerosis: Secondary | ICD-10-CM

## 2018-01-12 NOTE — Chronic Care Management (AMB) (Signed)
  Chronic Care Management   Note  01/12/2018 Name: Rebecca [name] MRN: [id number] DOB: 1960/09/13    Follow up call placed to [name] to reschedule cancelled CCM initial office visit last week.   Was unable to reach patient via telephone today and have left HIPAA compliant voicemail asking patient to return my call. (unsuccessful outreach #2).  Plan: Will follow-up within 3-5  business days via telephone. (Third and final outreach attempt)  Ruben Reason, PharmD Clinical Pharmacist [facility] [phone]

## 2018-01-15 ENCOUNTER — Other Ambulatory Visit: Payer: Self-pay

## 2018-01-15 ENCOUNTER — Telehealth: Payer: Self-pay

## 2018-01-15 DIAGNOSIS — I890 Lymphedema, not elsewhere classified: Secondary | ICD-10-CM

## 2018-01-15 DIAGNOSIS — M79605 Pain in left leg: Secondary | ICD-10-CM

## 2018-01-15 DIAGNOSIS — G35 Multiple sclerosis: Secondary | ICD-10-CM

## 2018-01-15 DIAGNOSIS — M79604 Pain in right leg: Secondary | ICD-10-CM

## 2018-01-15 NOTE — Telephone Encounter (Signed)
Copied from [place] [id number]. Topic: Referral - Request for Referral >> Jan 15, 2018 11:19 AM Cecelia Byars, NT wrote: Has patient seen PCP for this complaint?   Yes  *If NO, is insurance requiring patient see PCP for this issue before PCP can refer them? Referral for which specialty:  Massage for legs/ physical therapy [facility] provider[facility]  Reason for referral:  swelling in feet and legs due to MS, and lyphedema    Patient also called and asked  a referral to see a urologist during her last office visit , please call her at [phone]

## 2018-01-15 NOTE — Telephone Encounter (Signed)
I put in a urology referral on November 19th Please facilitate that happening  Please have home health evaluate her swelling, and see my last note under assessment and plan; they were supposed to see if therapist could come out to the home

## 2018-01-16 ENCOUNTER — Inpatient Hospital Stay: Payer: Medicare Other

## 2018-01-17 DIAGNOSIS — M542 Cervicalgia: Secondary | ICD-10-CM | POA: Diagnosis not present

## 2018-01-17 DIAGNOSIS — G35 Multiple sclerosis: Secondary | ICD-10-CM | POA: Diagnosis not present

## 2018-01-17 DIAGNOSIS — G40909 Epilepsy, unspecified, not intractable, without status epilepticus: Secondary | ICD-10-CM | POA: Diagnosis not present

## 2018-01-17 DIAGNOSIS — I890 Lymphedema, not elsewhere classified: Secondary | ICD-10-CM | POA: Diagnosis not present

## 2018-01-17 DIAGNOSIS — N319 Neuromuscular dysfunction of bladder, unspecified: Secondary | ICD-10-CM | POA: Diagnosis not present

## 2018-01-17 DIAGNOSIS — K592 Neurogenic bowel, not elsewhere classified: Secondary | ICD-10-CM | POA: Diagnosis not present

## 2018-01-18 ENCOUNTER — Telehealth: Payer: Self-pay

## 2018-01-18 ENCOUNTER — Ambulatory Visit: Payer: Self-pay | Admitting: Pharmacist

## 2018-01-18 DIAGNOSIS — N319 Neuromuscular dysfunction of bladder, unspecified: Secondary | ICD-10-CM

## 2018-01-18 DIAGNOSIS — I890 Lymphedema, not elsewhere classified: Secondary | ICD-10-CM

## 2018-01-18 DIAGNOSIS — G35 Multiple sclerosis: Secondary | ICD-10-CM

## 2018-01-18 NOTE — Chronic Care Management (AMB) (Addendum)
  Chronic Care Management   Note  01/18/2018 Name: Rebecca [name] MRN: [id number] DOB: 1960-04-10  Successful outreach with [name]. HIPAA verified x 2.   Rescheduled initial CCM office visit for after the new year as she has multiple appointments with In home nursing care and physical therapy.   Appointment scheduled for January 14 at 11 am    Ruben Reason, PharmD Clinical Pharmacist [facility] [employer] [phone]

## 2018-01-22 ENCOUNTER — Other Ambulatory Visit: Payer: Self-pay | Admitting: Family Medicine

## 2018-01-22 NOTE — Telephone Encounter (Signed)
Copied from [place] [phone]. Topic: Quick Communication - Rx Refill/Question >> Jan 22, 2018 12:04 PM Leward Quan A wrote: Medication: ibuprofen (ADVIL,MOTRIN) 600 MG tablet   Per patient no longer using the diclofenac sodium (VOLTAREN) 1 % GEL  Has the patient contacted their pharmacy? Yes.   (Agent: If no, request that the patient contact the pharmacy for the refill.) (Agent: If yes, when and what did the pharmacy advise?)  Preferred Pharmacy (with phone number or street name): [id number] - [place], [place] - [address] [phone] (Phone) [phone] (Fax)    Agent: Please be advised that RX refills may take up to 3 business days. We ask that you follow-up with your pharmacy.

## 2018-01-23 ENCOUNTER — Emergency Department
Admission: EM | Admit: 2018-01-23 | Discharge: 2018-01-23 | Disposition: A | Discharge status: Home / Self Care | Payer: Medicare Other | Attending: Emergency Medicine | Admitting: Emergency Medicine

## 2018-01-23 ENCOUNTER — Encounter: Payer: Self-pay | Admitting: Emergency Medicine

## 2018-01-23 ENCOUNTER — Other Ambulatory Visit: Payer: Self-pay

## 2018-01-23 DIAGNOSIS — R531 Weakness: Secondary | ICD-10-CM | POA: Diagnosis not present

## 2018-01-23 DIAGNOSIS — Z79899 Other long term (current) drug therapy: Secondary | ICD-10-CM | POA: Insufficient documentation

## 2018-01-23 DIAGNOSIS — Z853 Personal history of malignant neoplasm of breast: Secondary | ICD-10-CM | POA: Insufficient documentation

## 2018-01-23 DIAGNOSIS — R569 Unspecified convulsions: Secondary | ICD-10-CM | POA: Diagnosis not present

## 2018-01-23 DIAGNOSIS — I890 Lymphedema, not elsewhere classified: Secondary | ICD-10-CM | POA: Insufficient documentation

## 2018-01-23 DIAGNOSIS — N390 Urinary tract infection, site not specified: Secondary | ICD-10-CM | POA: Diagnosis not present

## 2018-01-23 DIAGNOSIS — R350 Frequency of micturition: Secondary | ICD-10-CM | POA: Diagnosis present

## 2018-01-23 LAB — URINALYSIS, COMPLETE (UACMP) WITH MICROSCOPIC
Bacteria, UA: NONE SEEN
Bilirubin Urine: NEGATIVE
Glucose, UA: 50 mg/dL — AB
HGB URINE DIPSTICK: NEGATIVE
Ketones, ur: NEGATIVE mg/dL
Nitrite: NEGATIVE
Protein, ur: NEGATIVE mg/dL
Specific Gravity, Urine: 1.01 (ref 1.005–1.030)
WBC, UA: >50 WBC/hpf — ABNORMAL HIGH (ref 0–5)
pH: 5 (ref 5.0–8.0)

## 2018-01-23 LAB — COMPREHENSIVE METABOLIC PANEL
ALT: 18 U/L (ref 0–44)
AST: 28 U/L (ref 15–41)
Albumin: 4.1 g/dL (ref 3.5–5.0)
Alkaline Phosphatase: 73 U/L (ref 38–126)
Anion gap: 7 (ref 5–15)
BUN: 16 mg/dL (ref 6–20)
CO2: 26 mmol/L (ref 22–32)
Calcium: 9.1 mg/dL (ref 8.9–10.3)
Chloride: 106 mmol/L (ref 98–111)
Creatinine, Ser: 0.63 mg/dL (ref 0.44–1.00)
GFR calc Af Amer: >60 mL/min (ref >60)
GFR calc non Af Amer: >60 mL/min (ref >60)
Glucose, Bld: 115 mg/dL — ABNORMAL HIGH (ref 70–99)
Potassium: 3.9 mmol/L (ref 3.5–5.1)
Sodium: 139 mmol/L (ref 135–145)
Total Bilirubin: 0.6 mg/dL (ref 0.3–1.2)
Total Protein: 7.3 g/dL (ref 6.5–8.1)

## 2018-01-23 LAB — CBC
HCT: 37.6 % (ref 36.0–46.0)
HEMOGLOBIN: 12.3 g/dL (ref 12.0–15.0)
MCH: 30.5 pg (ref 26.0–34.0)
MCHC: 32.7 g/dL (ref 30.0–36.0)
MCV: 93.3 fL (ref 80.0–100.0)
Platelets: 126 10*3/uL — ABNORMAL LOW (ref 150–400)
RBC: 4.03 MIL/uL (ref 3.87–5.11)
RDW: 12.5 % (ref 11.5–15.5)
WBC: 3.9 10*3/uL — ABNORMAL LOW (ref 4.0–10.5)
nRBC: 0 % (ref 0.0–0.2)

## 2018-01-23 MED ORDER — IBUPROFEN 600 MG PO TABS: 600.0000 mg | ORAL_TABLET | Freq: Three times a day (TID) | ORAL | 0 refills | Status: DC | PRN

## 2018-01-23 MED ORDER — CIPROFLOXACIN HCL 500 MG PO TABS: 500.0000 mg | ORAL_TABLET | Freq: Two times a day (BID) | ORAL | 0 refills | Status: DC

## 2018-01-23 MED ORDER — CEPHALEXIN 500 MG PO CAPS: 500.0000 mg | ORAL_CAPSULE | Freq: Two times a day (BID) | ORAL | 0 refills | Status: DC

## 2018-01-23 NOTE — ED Provider Notes (Signed)
[facility] Emergency Department Provider Note   ____________________________________________    I have reviewed the triage vital signs and the nursing notes.   HISTORY  Chief Complaint Weakness     HPI Rebecca [name] is a 57 y.o. female with a history of chronic lymphedema in her lower extremities for which she does compression management at home however over the last several days she has had to urinate quite frequently and she is concerned that she may have a urinary tract infection.  She denies dysuria but has frequency.  Denies fevers or chills or back pain.  She reports her lymphedema has gotten worse which is making it difficult for her to ambulate.  No nausea or vomiting or chest pain or shortness of breath   Past Medical History:  Diagnosis Date  . Aortic valve disease    Mild AS / AI - most recent Echo demonstrated tricuspid aortic valve.  . Bacterial endocarditis    History of .  Marland Kitchen Bilateral lower extremity edema    Noncardiac.  Chronic. LE Venous dopplers - negative for DVT.; Echocardiogram January 2016: Normal EF with normal wall motion and valve function. Only grade 1 diastolic dysfunction. EF 60-65%. Mild MR  . Breast cancer ([place]) 12-31-13   Right breast, 12:00, 1.5 cm, T1c,N0 invasive mammary carcinoma, triple negative. --> Rx with Chemo  . Cervical stenosis of spine   . Herpes zoster   . IBS (irritable bowel syndrome)   . Lymphedema    has legs wrapped at [facility]  . Multiple sclerosis ([place]) 2001   Walks from room to room @ home; but Wheelchair when going out.  Marland Kitchen Neuromuscular disorder ([place])    MS  . Seizures ([place])    Takes Keppra  . Syncope and collapse     Patient Active Problem List   Diagnosis Date Noted  . Abnormality of gait 11/17/2017  . 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 Bilateral lower extremity pain (primary) (bilateral) (right greater than left) 08/01/2016  . Chronic neck pain  (secondary) (bilateral) ( left greater than right) 07/28/2016  . Long term current use of opiate analgesic 07/28/2016  . Long term prescription opiate use 07/28/2016  . Opiate use 07/28/2016  . Neutropenia ([place]) 06/27/2016  . Chronic pain syndrome 06/03/2016  . Adjustment disorder with mixed anxiety and depressed mood   . Neurogenic bladder   . Neurogenic bowel   . Detrusor and sphincter dyssynergia   . Urinary incontinence   . Seizure disorder ([place])   . Slow transit constipation   . 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[place])   . Lymphedema   . Hypoalbuminemia due to protein-calorie malnutrition ([place])   . Encounter for screening for cervical cancer  11/27/2015  . Preventative health care 11/25/2015  . History of breast cancer in female 10/28/2015  . Anemia 10/28/2015  . Thrombocytopenia ([place]) 10/28/2015  . Allergic rhinitis 10/28/2015  . IBS (irritable bowel syndrome) 10/28/2015  . 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 History of right mastectomy 10/24/2014  . Medicare annual wellness visit, subsequent 10/24/2014  . MS (multiple sclerosis) ([place]) 09/16/2014  . Hematoma complicating a procedure 03/06/2014  . Primary cancer of right female breast ([place]) 01/10/2014  . SOB (shortness of breath) 03/01/2013  . Bilateral lower extremity edema   . 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 ANKLE SURGERY     Left  . ANKLE SURGERY    .  ANTERIOR CERVICAL DECOMP/DISCECTOMY FUSION  11/17/2011   Procedure: ANTERIOR CERVICAL DECOMPRESSION/DISCECTOMY FUSION 2 LEVELS;  Surgeon: Erline Levine, MD;  Location: [place] [facility];  Service: Neurosurgery;  Laterality: N/A;  Cervical Five-Six Six-Seven Anterior cervical decompression/diskectomy/fusion  . BREAST BIOPSY Right 12-31-13   invasive mammary  . BREAST SURGERY Right 02/03/2014   Right simple mastectomy with sentinel node biopsy.  . CHOLECYSTECTOMY    . COLONOSCOPY  2014  . Lower extremity venous Dopplers   Feb 27, 2013   No LE DVT  . MASTECTOMY Right 2015  . Port a cath insertion Right 01/19/2010  . PORT-A-CATH REMOVAL     right  . PORT-A-CATH REMOVAL Right 09/03/2013   Procedure: REMOVAL PORT-A-CATH;  Surgeon: Conrad Phenix, MD;  Location: [place];  Service: Vascular;  Laterality: Right;  . TRANSTHORACIC ECHOCARDIOGRAM  03/2013; 02/2014   a) Normal LV size and function with EF 60-65%.; Cannot exclude bicuspid aortic valve with mild AS and mild AI.; b) Normal EF with normal wall motion and valve function x Mild MR. G2 DD. EF 60-65%. Tricuspid AoV  . UPPER GI ENDOSCOPY  2014    Prior to Admission medications   Medication Sig Start Date End Date Taking? Authorizing Provider  amantadine (SYMMETREL) 100 MG capsule Take 1 capsule (100 mg total) by mouth 3 (three) times daily. 08/14/17   Penumalli, Earlean Polka, MD  baclofen (LIORESAL) 10 MG tablet TAKE 1 TO 2 TABLETS BY MOUTH 3 TIMES A DAY 08/08/17   Jamse Arn, MD  ciprofloxacin (CIPRO) 500 MG tablet Take 1 tablet (500 mg total) by mouth 2 (two) times daily. 01/23/18   Lavonia Drafts, MD  diclofenac sodium (VOLTAREN) 1 % GEL Apply 2 g topically 2 (two) times daily as needed. 12/07/17   Poulose, Bethel Born, NP  Disposable Gloves (ASSURANCE VINYL EXAM GLOVES) MISC Neurogenic bladder; MS; LON 99 months; for use for personal hygiene 12/26/17   Lada, Satira Anis, MD  glucosamine-chondroitin 500-400 MG tablet Take 1 tablet by mouth 4 (four) times daily.    [provider]  ibuprofen (ADVIL,MOTRIN) 600 MG tablet Take 1 tablet (600 mg total) by mouth every 8 (eight) hours as needed. 01/23/18   [name], MD  Incontinence Supply Disposable (PREVAIL WET WIPES) MISC Apply 1 each topically daily as needed. (dx neurogenic bladder N31.9, LON 99 months) 12/26/17   Lada, Satira Anis, MD  Interferon Beta-1a (REBIF REBIDOSE) 44 MCG/0.5ML SOAJ Inject 0.5 mLs into the skin 3 (three) times a week. 04/26/17   Penumalli, Earlean Polka, MD  levETIRAcetam (KEPPRA) 500 MG  tablet Take 1 tablet (500 mg total) by mouth 2 (two) times daily. 08/14/17   Penumalli, Earlean Polka, MD  Multiple Vitamins-Minerals (MULTIVITAMIN PO) Take 1 tablet by mouth 2 (two) times daily.     [provider]  nitrofurantoin (MACRODANTIN) 100 MG capsule Take 1 capsule (100 mg total) by mouth 2 (two) times daily. Patient not taking: Reported on 12/26/2017 10/17/17   Carrie Mew, MD  oxybutynin (DITROPAN-XL) 10 MG 24 hr tablet Take 2 tablets (20 mg total) by mouth daily. 02/13/17   Penumalli, Earlean Polka, MD  tiZANidine (ZANAFLEX) 4 MG tablet TAKE 1/2 TABLETS (2 MG TOTAL) BY MOUTH 2 (TWO) TIMES DAILY AS NEEDED (SPASTICITY UNCONTROLLED). 10/20/17   Jamse Arn, MD     Allergies Fentanyl and Sulfa antibiotics  Family History  Problem Relation Age of Onset  . Cancer Father        skin  . Heart disease  Father   . Heart attack Father        heart attack in his 67's  . Thyroid disease Sister   . Ovarian cancer Cousin   . Breast cancer Maternal Aunt 60  . Breast cancer Maternal Grandmother 59  . Bladder Cancer Neg Hx   . Kidney cancer Neg Hx     Social History Social History   Tobacco Use  . Smoking status: Never Smoker  . Smokeless tobacco: Never Used  Substance Use Topics  . Alcohol use: No  . Drug use: No    Review of Systems  Constitutional: No fever/chills Eyes: No visual changes.  ENT: No sore throat. Cardiovascular: Denies chest pain. Respiratory: Denies shortness of breath. Gastrointestinal: No abdominal pain.   Genitourinary: As above Musculoskeletal: Worsening lymphedema as above Skin: Negative for rash. Neurological: Negative for headaches or weakness   ____________________________________________   PHYSICAL EXAM:  VITAL SIGNS: ED Triage Vitals  Enc Vitals Group     BP 01/23/18 1221 (!) 147/82     Pulse Rate 01/23/18 1221 91     Resp 01/23/18 1221 16     Temp 01/23/18 1225 97.7 F (36.5 C)     Temp Source 01/23/18 1224 Oral     SpO2  01/23/18 1221 99 %     Weight --      Height --      Head Circumference --      Peak Flow --      Pain Score --      Pain Loc --      Pain Edu? --      Excl. in [place]? --     Constitutional: Alert and oriented. No acute distress. Pleasant and interactive  Nose: No congestion/rhinnorhea. Mouth/Throat: Mucous membranes are moist.   Neck:  Painless ROM Cardiovascular: Normal rate, regular rhythm. Grossly normal heart sounds.  Good peripheral circulation. Respiratory: Normal respiratory effort.  No retractions. Lungs CTAB. Gastrointestinal: Soft and nontender. No distention.   Genitourinary: No suprapubic tenderness Musculoskeletal: Bilateral lower extremity lymphedema no erythema or ulcerations Neurologic:  Normal speech and language. No gross focal neurologic deficits are appreciated.  Skin:  Skin is warm, dry and intact. No rash noted. Psychiatric: Mood and affect are normal. Speech and behavior are normal.  ____________________________________________   LABS (all labs ordered are listed, but only abnormal results are displayed)  Labs Reviewed  CBC - Abnormal; Notable for the following components:      Result Value   WBC 3.9 (*)    Platelets 126 (*)    All other components within normal limits  COMPREHENSIVE METABOLIC PANEL - Abnormal; Notable for the following components:   Glucose, Bld 115 (*)    All other components within normal limits  URINALYSIS, COMPLETE (UACMP) WITH MICROSCOPIC - Abnormal; Notable for the following components:   Color, Urine YELLOW (*)    APPearance HAZY (*)    Glucose, UA 50 (*)    Leukocytes, UA LARGE (*)    WBC, UA >50 (*)    All other components within normal limits   ____________________________________________  EKG   ____________________________________________  RADIOLOGY  None ____________________________________________   PROCEDURES  Procedure(s) performed: No  Procedures   Critical Care performed:  No ____________________________________________   INITIAL IMPRESSION / ASSESSMENT AND PLAN / ED COURSE  Pertinent labs & imaging results that were available during my care of the patient were reviewed by me and considered in my medical decision making (see chart for details).  Patient's  lab work overall reassuring, urinalysis consistent with UTI, review of prior cultures demonstrates Keflex resistance, will prescribe Cipro, outpatient follow-up recommended    ____________________________________________   FINAL CLINICAL IMPRESSION(S) / ED DIAGNOSES  Final diagnoses:  Lower urinary tract infectious disease        Note:  This document was prepared using Dragon voice recognition software and may include unintentional dictation errors.    Lavonia Drafts, MD 01/23/18 [phone]

## 2018-01-23 NOTE — Telephone Encounter (Signed)
Requested Prescriptions  Pending Prescriptions Disp Refills  . ibuprofen (ADVIL,MOTRIN) 600 MG tablet 180 tablet 0    Sig: Take 1 tablet (600 mg total) by mouth every 8 (eight) hours as needed.     There is no refill protocol information for this order

## 2018-01-23 NOTE — ED Triage Notes (Signed)
PT to ED via EMS from home with c/o weakness x1wk with increased mechanical falls. Hx of MS and lymphedema. Pt A&OX4, hx of UTI. Pt A&OX4, VSS. MD at bedside

## 2018-01-25 DIAGNOSIS — G35 Multiple sclerosis: Secondary | ICD-10-CM | POA: Diagnosis not present

## 2018-01-25 DIAGNOSIS — M542 Cervicalgia: Secondary | ICD-10-CM | POA: Diagnosis not present

## 2018-01-25 DIAGNOSIS — G40909 Epilepsy, unspecified, not intractable, without status epilepticus: Secondary | ICD-10-CM | POA: Diagnosis not present

## 2018-01-25 DIAGNOSIS — K592 Neurogenic bowel, not elsewhere classified: Secondary | ICD-10-CM | POA: Diagnosis not present

## 2018-01-25 DIAGNOSIS — N319 Neuromuscular dysfunction of bladder, unspecified: Secondary | ICD-10-CM | POA: Diagnosis not present

## 2018-01-25 DIAGNOSIS — I890 Lymphedema, not elsewhere classified: Secondary | ICD-10-CM | POA: Diagnosis not present

## 2018-01-25 NOTE — Telephone Encounter (Signed)
refaxed what we had

## 2018-01-25 NOTE — Telephone Encounter (Signed)
Rebecca [name] from [facility] called in and states he never received the fax and he wants to know if they can be faxed over again.

## 2018-01-26 ENCOUNTER — Other Ambulatory Visit: Payer: Self-pay | Admitting: Nurse Practitioner

## 2018-01-26 ENCOUNTER — Telehealth: Payer: Self-pay | Admitting: Family Medicine

## 2018-01-26 DIAGNOSIS — M542 Cervicalgia: Secondary | ICD-10-CM | POA: Diagnosis not present

## 2018-01-26 DIAGNOSIS — G40909 Epilepsy, unspecified, not intractable, without status epilepticus: Secondary | ICD-10-CM | POA: Diagnosis not present

## 2018-01-26 DIAGNOSIS — G35 Multiple sclerosis: Secondary | ICD-10-CM | POA: Diagnosis not present

## 2018-01-26 DIAGNOSIS — I890 Lymphedema, not elsewhere classified: Secondary | ICD-10-CM | POA: Diagnosis not present

## 2018-01-26 DIAGNOSIS — N319 Neuromuscular dysfunction of bladder, unspecified: Secondary | ICD-10-CM | POA: Diagnosis not present

## 2018-01-26 DIAGNOSIS — K592 Neurogenic bowel, not elsewhere classified: Secondary | ICD-10-CM | POA: Diagnosis not present

## 2018-01-26 NOTE — Telephone Encounter (Signed)
Copied from [place] [id number]. Topic: General - Other >> Jan 26, 2018  2:16 PM Keene Breath wrote: Reason for CRM: Patient called to inform the doctor that the neurologist that she referred her to does not take her medicaid insurance.  Patient is requesting a referral to someone else who will take her insurance.  Please advise and call back at [phone]

## 2018-01-26 NOTE — Telephone Encounter (Signed)
Spoke to pt, sending referral to [facility], requesting Dr other than Dr Ernestine Conrad

## 2018-02-01 ENCOUNTER — Other Ambulatory Visit: Payer: Self-pay

## 2018-02-01 ENCOUNTER — Telehealth: Payer: Self-pay | Admitting: Family Medicine

## 2018-02-01 DIAGNOSIS — C50911 Malignant neoplasm of unspecified site of right female breast: Secondary | ICD-10-CM

## 2018-02-01 NOTE — Telephone Encounter (Signed)
Copied from [place] [id number]. Topic: Quick Communication - See Telephone Encounter >> Feb 01, 2018  4:17 PM Antonieta Iba C wrote: CRM for notification. See Telephone encounter for: 02/01/18.   Ben PT w/ [facility] is calling in for vo.    Frequency: 1 week 1 for evaluation   CB: [phone]

## 2018-02-02 ENCOUNTER — Emergency Department: Payer: Medicare Other

## 2018-02-02 ENCOUNTER — Other Ambulatory Visit: Payer: Self-pay

## 2018-02-02 ENCOUNTER — Encounter: Payer: Self-pay | Admitting: Emergency Medicine

## 2018-02-02 ENCOUNTER — Inpatient Hospital Stay
Admission: EM | Admit: 2018-02-02 | Discharge: 2018-02-06 | DRG: 607 | Disposition: A | Discharge status: Home Health | Payer: Medicare Other | Attending: Specialist | Admitting: Specialist

## 2018-02-02 DIAGNOSIS — R0602 Shortness of breath: Secondary | ICD-10-CM | POA: Diagnosis not present

## 2018-02-02 DIAGNOSIS — R262 Difficulty in walking, not elsewhere classified: Secondary | ICD-10-CM | POA: Diagnosis present

## 2018-02-02 DIAGNOSIS — Z87440 Personal history of urinary (tract) infections: Secondary | ICD-10-CM

## 2018-02-02 DIAGNOSIS — E860 Dehydration: Secondary | ICD-10-CM | POA: Diagnosis not present

## 2018-02-02 DIAGNOSIS — I890 Lymphedema, not elsewhere classified: Secondary | ICD-10-CM | POA: Diagnosis not present

## 2018-02-02 DIAGNOSIS — Z9011 Acquired absence of right breast and nipple: Secondary | ICD-10-CM

## 2018-02-02 DIAGNOSIS — Z853 Personal history of malignant neoplasm of breast: Secondary | ICD-10-CM

## 2018-02-02 DIAGNOSIS — G35 Multiple sclerosis: Secondary | ICD-10-CM | POA: Diagnosis not present

## 2018-02-02 DIAGNOSIS — Z885 Allergy status to narcotic agent status: Secondary | ICD-10-CM

## 2018-02-02 DIAGNOSIS — N3090 Cystitis, unspecified without hematuria: Secondary | ICD-10-CM | POA: Diagnosis not present

## 2018-02-02 DIAGNOSIS — Z803 Family history of malignant neoplasm of breast: Secondary | ICD-10-CM

## 2018-02-02 DIAGNOSIS — R609 Edema, unspecified: Secondary | ICD-10-CM | POA: Diagnosis not present

## 2018-02-02 DIAGNOSIS — R5381 Other malaise: Secondary | ICD-10-CM | POA: Diagnosis not present

## 2018-02-02 DIAGNOSIS — Z79899 Other long term (current) drug therapy: Secondary | ICD-10-CM

## 2018-02-02 DIAGNOSIS — I359 Nonrheumatic aortic valve disorder, unspecified: Secondary | ICD-10-CM | POA: Diagnosis present

## 2018-02-02 DIAGNOSIS — R600 Localized edema: Secondary | ICD-10-CM | POA: Diagnosis not present

## 2018-02-02 DIAGNOSIS — Z23 Encounter for immunization: Secondary | ICD-10-CM

## 2018-02-02 DIAGNOSIS — Z882 Allergy status to sulfonamides status: Secondary | ICD-10-CM

## 2018-02-02 DIAGNOSIS — D638 Anemia in other chronic diseases classified elsewhere: Secondary | ICD-10-CM | POA: Diagnosis present

## 2018-02-02 DIAGNOSIS — K589 Irritable bowel syndrome without diarrhea: Secondary | ICD-10-CM | POA: Diagnosis present

## 2018-02-02 DIAGNOSIS — R531 Weakness: Secondary | ICD-10-CM | POA: Diagnosis not present

## 2018-02-02 DIAGNOSIS — Z9049 Acquired absence of other specified parts of digestive tract: Secondary | ICD-10-CM

## 2018-02-02 DIAGNOSIS — M7989 Other specified soft tissue disorders: Secondary | ICD-10-CM | POA: Diagnosis not present

## 2018-02-02 DIAGNOSIS — R569 Unspecified convulsions: Secondary | ICD-10-CM | POA: Diagnosis present

## 2018-02-02 LAB — URINALYSIS, COMPLETE (UACMP) WITH MICROSCOPIC
Bacteria, UA: NONE SEEN
Bilirubin Urine: NEGATIVE
Glucose, UA: NEGATIVE mg/dL
Hgb urine dipstick: NEGATIVE
Ketones, ur: NEGATIVE mg/dL
Nitrite: NEGATIVE
Protein, ur: 30 mg/dL — AB
Specific Gravity, Urine: 1.025 (ref 1.005–1.030)
pH: 5 (ref 5.0–8.0)

## 2018-02-02 LAB — CBC WITH DIFFERENTIAL/PLATELET
Abs Immature Granulocytes: 0 10*3/uL (ref 0.00–0.07)
BASOS ABS: 0 10*3/uL (ref 0.0–0.1)
Basophils Relative: 1 %
Eosinophils Absolute: 0.1 10*3/uL (ref 0.0–0.5)
Eosinophils Relative: 2 %
HCT: 35.8 % — ABNORMAL LOW (ref 36.0–46.0)
Hemoglobin: 11.8 g/dL — ABNORMAL LOW (ref 12.0–15.0)
IMMATURE GRANULOCYTES: 0 %
Lymphocytes Relative: 20 %
Lymphs Abs: 0.9 10*3/uL (ref 0.7–4.0)
MCH: 30.3 pg (ref 26.0–34.0)
MCHC: 33 g/dL (ref 30.0–36.0)
MCV: 92 fL (ref 80.0–100.0)
Monocytes Absolute: 0.5 10*3/uL (ref 0.1–1.0)
Monocytes Relative: 12 %
NEUTROS PCT: 65 %
Neutro Abs: 2.8 10*3/uL (ref 1.7–7.7)
Platelets: 129 10*3/uL — ABNORMAL LOW (ref 150–400)
RBC: 3.89 MIL/uL (ref 3.87–5.11)
RDW: 12.4 % (ref 11.5–15.5)
WBC: 4.3 10*3/uL (ref 4.0–10.5)
nRBC: 0 % (ref 0.0–0.2)

## 2018-02-02 LAB — COMPREHENSIVE METABOLIC PANEL
ALT: 24 U/L (ref 0–44)
AST: 25 U/L (ref 15–41)
Albumin: 4.1 g/dL (ref 3.5–5.0)
Alkaline Phosphatase: 87 U/L (ref 38–126)
Anion gap: 9 (ref 5–15)
BUN: 19 mg/dL (ref 6–20)
CO2: 26 mmol/L (ref 22–32)
Calcium: 9.3 mg/dL (ref 8.9–10.3)
Chloride: 106 mmol/L (ref 98–111)
Creatinine, Ser: 0.41 mg/dL — ABNORMAL LOW (ref 0.44–1.00)
GFR calc Af Amer: >60 mL/min (ref >60)
GFR calc non Af Amer: >60 mL/min (ref >60)
GLUCOSE: 113 mg/dL — AB (ref 70–99)
Potassium: 3.7 mmol/L (ref 3.5–5.1)
Sodium: 141 mmol/L (ref 135–145)
Total Bilirubin: 0.8 mg/dL (ref 0.3–1.2)
Total Protein: 7.2 g/dL (ref 6.5–8.1)

## 2018-02-02 LAB — TROPONIN I

## 2018-02-02 MED ORDER — HYDROMORPHONE HCL 1 MG/ML IJ SOLN
0.5000 mg | Freq: Once | INTRAMUSCULAR | Status: AC
  Administered 2018-02-02: 0.5 mg via INTRAVENOUS
  Filled 2018-02-02: qty 1

## 2018-02-02 MED ORDER — FUROSEMIDE 10 MG/ML IJ SOLN
40.0000 mg | Freq: Two times a day (BID) | INTRAMUSCULAR | Status: DC
  Administered 2018-02-03 – 2018-02-04 (×3): 40 mg via INTRAVENOUS
  Filled 2018-02-02 (×3): qty 4

## 2018-02-02 MED ORDER — INTERFERON BETA-1A 44 MCG/0.5ML ~~LOC~~ SOAJ
0.5000 mL | SUBCUTANEOUS | Status: DC
  Administered 2018-02-02: 0.5 mL via SUBCUTANEOUS

## 2018-02-02 MED ORDER — ACETAMINOPHEN 650 MG RE SUPP: 650.0000 mg | Freq: Four times a day (QID) | RECTAL | Status: DC | PRN

## 2018-02-02 MED ORDER — ACETAMINOPHEN 325 MG PO TABS: 650.0000 mg | ORAL_TABLET | Freq: Four times a day (QID) | ORAL | Status: DC | PRN

## 2018-02-02 MED ORDER — CEPHALEXIN 500 MG PO CAPS: 500.0000 mg | ORAL_CAPSULE | Freq: Four times a day (QID) | ORAL | 0 refills | Status: DC

## 2018-02-02 MED ORDER — DOCUSATE SODIUM 100 MG PO CAPS
100.0000 mg | ORAL_CAPSULE | Freq: Two times a day (BID) | ORAL | Status: DC
  Administered 2018-02-02 – 2018-02-06 (×8): 100 mg via ORAL
  Filled 2018-02-02 (×8): qty 1

## 2018-02-02 MED ORDER — ONDANSETRON HCL 4 MG PO TABS: 4.0000 mg | ORAL_TABLET | Freq: Four times a day (QID) | ORAL | Status: DC | PRN

## 2018-02-02 MED ORDER — LEVETIRACETAM 500 MG PO TABS
500.0000 mg | ORAL_TABLET | Freq: Two times a day (BID) | ORAL | Status: DC
  Administered 2018-02-02 – 2018-02-06 (×8): 500 mg via ORAL
  Filled 2018-02-02 (×9): qty 1

## 2018-02-02 MED ORDER — TRAZODONE HCL 50 MG PO TABS: 25.0000 mg | ORAL_TABLET | Freq: Every evening | ORAL | Status: DC | PRN

## 2018-02-02 MED ORDER — FUROSEMIDE 10 MG/ML IJ SOLN
60.0000 mg | Freq: Once | INTRAMUSCULAR | Status: AC
  Administered 2018-02-02: 22:00:00 60 mg via INTRAVENOUS
  Filled 2018-02-02: qty 6
  Filled 2018-02-02: qty 8

## 2018-02-02 MED ORDER — PNEUMOCOCCAL VAC POLYVALENT 25 MCG/0.5ML IJ INJ
0.5000 mL | INJECTION | INTRAMUSCULAR | Status: AC
  Administered 2018-02-03: 09:00:00 0.5 mL via INTRAMUSCULAR
  Filled 2018-02-02: qty 0.5

## 2018-02-02 MED ORDER — FUROSEMIDE 20 MG PO TABS: 20.0000 mg | ORAL_TABLET | Freq: Every day | ORAL | 0 refills | Status: DC

## 2018-02-02 MED ORDER — ADULT MULTIVITAMIN W/MINERALS CH
1.0000 | ORAL_TABLET | Freq: Two times a day (BID) | ORAL | Status: DC
  Administered 2018-02-03 – 2018-02-06 (×7): 1 via ORAL
  Filled 2018-02-02 (×8): qty 1

## 2018-02-02 MED ORDER — TIZANIDINE HCL 2 MG PO TABS
2.0000 mg | ORAL_TABLET | Freq: Four times a day (QID) | ORAL | Status: DC | PRN
  Administered 2018-02-02 – 2018-02-05 (×3): 2 mg via ORAL
  Filled 2018-02-02 (×5): qty 1

## 2018-02-02 MED ORDER — OXYBUTYNIN CHLORIDE ER 10 MG PO TB24
20.0000 mg | ORAL_TABLET | Freq: Every day | ORAL | Status: DC
  Administered 2018-02-03 – 2018-02-06 (×4): 20 mg via ORAL
  Filled 2018-02-02 (×4): qty 2

## 2018-02-02 MED ORDER — AMANTADINE HCL 100 MG PO CAPS
100.0000 mg | ORAL_CAPSULE | Freq: Three times a day (TID) | ORAL | Status: DC
  Administered 2018-02-02 – 2018-02-06 (×11): 100 mg via ORAL
  Filled 2018-02-02 (×14): qty 1

## 2018-02-02 MED ORDER — CIPROFLOXACIN HCL 500 MG PO TABS
500.0000 mg | ORAL_TABLET | Freq: Two times a day (BID) | ORAL | Status: DC
  Administered 2018-02-02 – 2018-02-05 (×6): 500 mg via ORAL
  Filled 2018-02-02 (×6): qty 1

## 2018-02-02 MED ORDER — SODIUM CHLORIDE 0.9 % IV SOLN
1.0000 g | Freq: Once | INTRAVENOUS | Status: AC
  Administered 2018-02-02: 1 g via INTRAVENOUS
  Filled 2018-02-02: qty 10

## 2018-02-02 MED ORDER — ENOXAPARIN SODIUM 40 MG/0.4ML ~~LOC~~ SOLN
40.0000 mg | SUBCUTANEOUS | Status: DC
  Administered 2018-02-02 – 2018-02-05 (×4): 40 mg via SUBCUTANEOUS
  Filled 2018-02-02 (×4): qty 0.4

## 2018-02-02 MED ORDER — BACLOFEN 10 MG PO TABS
10.0000 mg | ORAL_TABLET | Freq: Three times a day (TID) | ORAL | Status: DC
  Administered 2018-02-02 – 2018-02-03 (×2): 20 mg via ORAL
  Filled 2018-02-02 (×4): qty 2

## 2018-02-02 MED ORDER — ONDANSETRON HCL 4 MG/2ML IJ SOLN: 4.0000 mg | Freq: Four times a day (QID) | INTRAMUSCULAR | Status: DC | PRN

## 2018-02-02 MED ORDER — BISACODYL 5 MG PO TBEC
5.0000 mg | DELAYED_RELEASE_TABLET | Freq: Every day | ORAL | Status: DC | PRN
  Administered 2018-02-03: 22:00:00 5 mg via ORAL
  Filled 2018-02-02: qty 1

## 2018-02-02 NOTE — H&P (Signed)
[facility] NAME: Rebecca [name]    MR#:  [id number]  DATE OF BIRTH:  31-Dec-1960  DATE OF ADMISSION:  02/02/2018  PRIMARY CARE PHYSICIAN: [name], MD   REQUESTING/REFERRING PHYSICIAN: Dr. Merlyn Lot  CHIEF COMPLAINT: Lymphedema, unable to ambulate   Chief Complaint  Patient presents with  . Leg Swelling  . Urinary Frequency    HISTORY OF PRESENT ILLNESS:  Rebecca [name]  is a 57 y.o. female with a known history of chronic lymphedema of legs, gotten worse since 2 days, patient unable to ambulate because of worsening lymphedema, patient blood work is largely normal, leg ultrasound also is negative.  She has history of multiple sclerosis, because of worsening lymphedema for last 2 days and unable to ambulate ER physician asked me to admit even though her blood work is largely normal.  Patient supposed to be discharged from ER but because she is unable to ambulate, family requested admission.  Patient actually is followed by home health nurse for the first time today and they called EMS because they found that she is not able to ambulate.  Patient does not think it is a MS flare.  Patient supposed to get home nursing services to help her with bathing which is started today.  No chest pain or shortness of breath.  PAST MEDICAL HISTORY:   Past Medical History:  Diagnosis Date  . Aortic valve disease    Mild AS / AI - most recent Echo demonstrated tricuspid aortic valve.  . Bacterial endocarditis    History of .  Marland Kitchen Bilateral lower extremity edema    Noncardiac.  Chronic. LE Venous dopplers - negative for DVT.; Echocardiogram January 2016: Normal EF with normal wall motion and valve function. Only grade 1 diastolic dysfunction. EF 60-65%. Mild MR  . Breast cancer ([place]) 12-31-13   Right breast, 12:00, 1.5 cm, T1c,N0 invasive mammary carcinoma, triple negative. --> Rx with Chemo  . Cervical stenosis of spine   . Herpes  zoster   . IBS (irritable bowel syndrome)   . Lymphedema    has legs wrapped at [facility]  . Multiple sclerosis ([place]) 2001   Walks from room to room @ home; but Wheelchair when going out.  Marland Kitchen Neuromuscular disorder ([place])    MS  . Seizures ([place])    Takes Keppra  . Syncope and collapse     PAST SURGICAL HISTOIRY:   Past Surgical History:  Procedure Laterality Date  . ANKLE SURGERY     Left  . ANKLE SURGERY    . ANTERIOR CERVICAL DECOMP/DISCECTOMY FUSION  11/17/2011   Procedure: ANTERIOR CERVICAL DECOMPRESSION/DISCECTOMY FUSION 2 LEVELS;  Surgeon: Erline Levine, MD;  Location: [facility];  Service: Neurosurgery;  Laterality: N/A;  Cervical Five-Six Six-Seven Anterior cervical decompression/diskectomy/fusion  . BREAST BIOPSY Right 12-31-13   invasive mammary  . BREAST SURGERY Right 02/03/2014   Right simple mastectomy with sentinel node biopsy.  . CHOLECYSTECTOMY    . COLONOSCOPY  2014  . Lower extremity venous Dopplers  Feb 27, 2013   No LE DVT  . MASTECTOMY Right 2015  . Port a cath insertion Right 01/19/2010  . PORT-A-CATH REMOVAL     right  . PORT-A-CATH REMOVAL Right 09/03/2013   Procedure: REMOVAL PORT-A-CATH;  Surgeon: [facility] Wesson, MD;  Location: [facility];  Service: Vascular;  Laterality: Right;  . TRANSTHORACIC ECHOCARDIOGRAM  03/2013; 02/2014   a) Normal LV size and function with EF  60-65%.; Cannot exclude bicuspid aortic valve with mild AS and mild AI.; b) Normal EF with normal wall motion and valve function x Mild MR. G2 DD. EF 60-65%. Tricuspid AoV  . UPPER GI ENDOSCOPY  2014    SOCIAL HISTORY:   Social History   Tobacco Use  . Smoking status: Never Smoker  . Smokeless tobacco: Never Used  Substance Use Topics  . Alcohol use: No    FAMILY HISTORY:   Family History  Problem Relation Age of Onset  . Cancer Father        skin  . Heart disease Father   . Heart attack Father        heart attack in his 39's  . Thyroid disease Sister   . Ovarian cancer Cousin   .  Breast cancer Maternal Aunt 60  . Breast cancer Maternal Grandmother 71  . Bladder Cancer Neg Hx   . Kidney cancer Neg Hx     DRUG ALLERGIES:   Allergies  Allergen Reactions  . Fentanyl Nausea And Vomiting and Nausea Only    vomiting Was given in PACU x3 each time patient got sick vomiting Was given in PACU x3 each time patient got sick  . Sulfa Antibiotics Hives and Other (See Comments)    Light headed, over heated    REVIEW OF SYSTEMS:  CONSTITUTIONAL: No fever, fatigue or weakness.  EYES: No blurred or double vision.  EARS, NOSE, AND THROAT: No tinnitus or ear pain.  RESPIRATORY: No cough, shortness of breath, wheezing or hemoptysis.  CARDIOVASCULAR: No chest pain, orthopnea, edema.  GASTROINTESTINAL: No nausea, vomiting, diarrhea or abdominal pain.  GENITOURINARY: No dysuria, hematuria.  ENDOCRINE: No polyuria, nocturia,  HEMATOLOGY: No anemia, easy bruising or bleeding SKIN: No rash or lesion. MUSCULOSKELETAL: No joint pain or arthritis.  Patient has massive lymphedema of both legs NEUROLOGIC: No tingling, numbness, weakness.  PSYCHIATRY: No anxiety or depression.   MEDICATIONS AT HOME:   Prior to Admission medications   Medication Sig Start Date End Date Taking? Authorizing Provider  amantadine (SYMMETREL) 100 MG capsule Take 1 capsule (100 mg total) by mouth 3 (three) times daily. 08/14/17   Penumalli, Earlean Polka, MD  baclofen (LIORESAL) 10 MG tablet TAKE 1 TO 2 TABLETS BY MOUTH 3 TIMES A DAY 08/08/17   Patel, Domenick Bookbinder, MD  cephALEXin (KEFLEX) 500 MG capsule Take 1 capsule (500 mg total) by mouth 4 (four) times daily for 10 days. 02/02/18 02/12/18  Earleen Newport, MD  ciprofloxacin (CIPRO) 500 MG tablet Take 1 tablet (500 mg total) by mouth 2 (two) times daily. 01/23/18   Lavonia Drafts, MD  diclofenac sodium (VOLTAREN) 1 % GEL Apply 2 g topically 4 (four) times daily as needed. Apply to knees 01/26/18   Lada, Satira Anis, MD  Disposable Gloves (ASSURANCE VINYL EXAM  GLOVES) MISC Neurogenic bladder; MS; LON 99 months; for use for personal hygiene 12/26/17   Lada, Satira Anis, MD  furosemide (LASIX) 20 MG tablet Take 1 tablet (20 mg total) by mouth daily for 7 days. 02/02/18 02/09/18  Earleen Newport, MD  glucosamine-chondroitin 500-400 MG tablet Take 1 tablet by mouth 4 (four) times daily.    [provider]  ibuprofen (ADVIL,MOTRIN) 600 MG tablet Take 1 tablet (600 mg total) by mouth every 8 (eight) hours as needed. 01/23/18   [name], MD  Incontinence Supply Disposable (PREVAIL WET WIPES) MISC Apply 1 each topically daily as needed. (dx neurogenic bladder N31.9, LON 99 months) 12/26/17  [name], MD  Interferon Beta-1a (REBIF REBIDOSE) 44 MCG/0.5ML SOAJ Inject 0.5 mLs into the skin 3 (three) times a week. 04/26/17   Penumalli, Earlean Polka, MD  levETIRAcetam (KEPPRA) 500 MG tablet Take 1 tablet (500 mg total) by mouth 2 (two) times daily. 08/14/17   Penumalli, Earlean Polka, MD  Multiple Vitamins-Minerals (MULTIVITAMIN PO) Take 1 tablet by mouth 2 (two) times daily.     [provider]  nitrofurantoin (MACRODANTIN) 100 MG capsule Take 1 capsule (100 mg total) by mouth 2 (two) times daily. Patient not taking: Reported on 12/26/2017 10/17/17   Carrie Mew, MD  oxybutynin (DITROPAN-XL) 10 MG 24 hr tablet Take 2 tablets (20 mg total) by mouth daily. 02/13/17   Penumalli, Earlean Polka, MD  tiZANidine (ZANAFLEX) 4 MG tablet TAKE 1/2 TABLETS (2 MG TOTAL) BY MOUTH 2 (TWO) TIMES DAILY AS NEEDED (SPASTICITY UNCONTROLLED). 10/20/17   Jamse Arn, MD      VITAL SIGNS:  Blood pressure 140/73, pulse 84, temperature 97.8 F (36.6 C), temperature source Oral, resp. rate 13, height 5\' 4"  (1.626 m), weight 83.9 kg, SpO2 99 %.  PHYSICAL EXAMINATION:  GENERAL:  57 y.o.-year-old patient lying in the bed with no acute distress.  EYES: Pupils equal, round, reactive to light and accommodation. No scleral icterus. Extraocular muscles intact.  HEENT:  Head atraumatic, normocephalic. Oropharynx and nasopharynx clear.  NECK:  Supple, no jugular venous distention. No thyroid enlargement, no tenderness.  LUNGS: Normal breath sounds bilaterally, no wheezing, rales,rhonchi or crepitation. No use of accessory muscles of respiration.  CARDIOVASCULAR: S1, S2 normal. No murmurs, rubs, or gallops.  ABDOMEN: Soft, nontender, nondistended. Bowel sounds present. No organomegaly or mass.  EXTREMITIES: Lymphedema of both legs NEUROLOGIC: Cranial nerves II through XII are intact. Muscle strength 5/5 in all extremities. Sensation intact. Gait not checked.  PSYCHIATRIC: The patient is alert and oriented x 3.  SKIN: No obvious rash, lesion, or ulcer.   LABORATORY PANEL:   CBC Recent Labs  Lab 02/02/18 1237  WBC 4.3  HGB 11.8*  HCT 35.8*  PLT 129*   ------------------------------------------------------------------------------------------------------------------  Chemistries  Recent Labs  Lab 02/02/18 1237  NA 141  K 3.7  CL 106  CO2 26  GLUCOSE 113*  BUN 19  CREATININE 0.41*  CALCIUM 9.3  AST 25  ALT 24  ALKPHOS 87  BILITOT 0.8   ------------------------------------------------------------------------------------------------------------------  Cardiac Enzymes Recent Labs  Lab 02/02/18 1237  TROPONINI <0.03   ------------------------------------------------------------------------------------------------------------------  RADIOLOGY:  US Venous Img Lower Bilateral  Result Date: 02/02/2018 CLINICAL DATA:  Bilateral lower extremity pain and edema for the past 2 days. History of chronic lymphedema. History of breast cancer. Evaluate for DVT. EXAM: BILATERAL LOWER EXTREMITY VENOUS DOPPLER ULTRASOUND TECHNIQUE: Gray-scale sonography with graded compression, as well as color Doppler and duplex ultrasound were performed to evaluate the lower extremity deep venous systems from the level of the common femoral vein and including the common  femoral, femoral, profunda femoral, popliteal and calf veins including the posterior tibial, peroneal and gastrocnemius veins when visible. The superficial great saphenous vein was also interrogated. Spectral Doppler was utilized to evaluate flow at rest and with distal augmentation maneuvers in the common femoral, femoral and popliteal veins. COMPARISON:  None. FINDINGS: RIGHT LOWER EXTREMITY Common Femoral Vein: No evidence of thrombus. Normal compressibility, respiratory phasicity and response to augmentation. Saphenofemoral Junction: No evidence of thrombus. Normal compressibility and flow on color Doppler imaging. Profunda Femoral Vein: No evidence of thrombus. Normal compressibility and flow  on color Doppler imaging. Femoral Vein: No evidence of thrombus. Normal compressibility, respiratory phasicity and response to augmentation. Popliteal Vein: Appears patent where imaged. Calf Veins: No evidence of thrombus. Normal compressibility and flow on color Doppler imaging. Superficial Great Saphenous Vein: No evidence of thrombus. Normal compressibility. Other Findings:  None. LEFT LOWER EXTREMITY Common Femoral Vein: No evidence of thrombus. Normal compressibility, respiratory phasicity and response to augmentation. Saphenofemoral Junction: No evidence of thrombus. Normal compressibility and flow on color Doppler imaging. Profunda Femoral Vein: No evidence of thrombus. Normal compressibility and flow on color Doppler imaging. Femoral Vein: No evidence of thrombus. Normal compressibility, respiratory phasicity and response to augmentation. Popliteal Vein: No evidence of thrombus. Normal compressibility, respiratory phasicity and response to augmentation. Calf Veins: Appear patent where imaged. Superficial Great Saphenous Vein: No evidence of thrombus. Normal compressibility. Other Findings: Subcutaneous edema is noted at the level the left calf (image 69). IMPRESSION: No evidence of DVT within either lower  extremity. Electronically Signed   By: Sandi Mariscal M.D.   On: 02/02/2018 14:47    EKG:   Orders placed or performed during the hospital encounter of 01/17/17  . EKG 12-Lead  . EKG 12-Lead  . EKG 12-Lead  . EKG 12-Lead  . EKG    IMPRESSION AND PLAN:   57 year old female with a multiple sclerosis and on baclofen, interferon, amantadine, chronic lymphedema comes in because of worsening lymphedema with unable to ambulate today, supposed to get home health services as per PCP , [facility] through [facility],  #1 .worsening lymphedema, added IV Lasix, ultrasound of legs is negative for DVT, continue sequential compression devices, discharge tomorrow with home health nurse and make sure she gets her services started tomorrow. 2.  History of MS, patient is on interferon, baclofen, amantadine, resume them. 3.  Cystitis: Continue Cipro, UA is better than before. 4.  Generalized weakness, physical therapy consult, likely discharge home tomorrow with home health and we have to make sure she gets home health services from tomorrow otherwise she is at high risk for recurrent admissions for the same.   All the records are reviewed and case discussed with ED provider. Management plans discussed with the patient, family and they are in agreement.  CODE STATUS: full code  TOTAL TIME TAKING CARE OF THIS PATIENT: 55 minutes.    Epifanio Lesches M.D on 02/02/2018 at 6:04 PM  Between 7am to 6pm - Pager - [phone]  After 6pm go to [email or web address] - password [email or web address] [facility]  Office  [phone]  CC: Primary care physician; [name], MD  Note: This dictation was prepared with Dragon dictation along with smaller phrase technology. Any transcriptional errors that result from this process are unintentional.

## 2018-02-02 NOTE — ED Provider Notes (Addendum)
[facility] Emergency Department Provider Note       Time seen: ----------------------------------------- 12:35 PM on 02/02/2018 -----------------------------------------   I have reviewed the triage vital signs and the nursing notes.  HISTORY   Chief Complaint Leg Swelling and Urinary Frequency    HPI Rebecca [name] is a 57 y.o. female with a history of aortic valve disease, endocarditis, chronic lymphedema, breast cancer, IBS, multiple sclerosis, seizures who presents to the ED for increasing swelling to her legs and feet.  She reports a history of lymphedema but the swelling and pain is worse than normal, particularly in the right leg.  She also reports urinary frequency and malodorous urine.  Recently she finished antibiotic for UTI.  Past Medical History:  Diagnosis Date  . Aortic valve disease    Mild AS / AI - most recent Echo demonstrated tricuspid aortic valve.  . Bacterial endocarditis    History of .  Marland Kitchen Bilateral lower extremity edema    Noncardiac.  Chronic. LE Venous dopplers - negative for DVT.; Echocardiogram January 2016: Normal EF with normal wall motion and valve function. Only grade 1 diastolic dysfunction. EF 60-65%. Mild MR  . Breast cancer ([place]) 12-31-13   Right breast, 12:00, 1.5 cm, T1c,N0 invasive mammary carcinoma, triple negative. --> Rx with Chemo  . Cervical stenosis of spine   . Herpes zoster   . IBS (irritable bowel syndrome)   . Lymphedema    has legs wrapped at [facility]  . Multiple sclerosis ([place]) 2001   Walks from room to room @ home; but Wheelchair when going out.  Marland Kitchen Neuromuscular disorder ([place])    MS  . Seizures ([place])    Takes Keppra  . Syncope and collapse     Patient Active Problem List   Diagnosis Date Noted  . Abnormality of gait 11/17/2017  . 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 Bilateral lower extremity pain (primary) (bilateral) (right greater than left) 08/01/2016  .  Chronic neck pain (secondary) (bilateral) ( left greater than right) 07/28/2016  . Long term current use of opiate analgesic 07/28/2016  . Long term prescription opiate use 07/28/2016  . Opiate use 07/28/2016  . Neutropenia ([place]) 06/27/2016  . Chronic pain syndrome 06/03/2016  . Adjustment disorder with mixed anxiety and depressed mood   . Neurogenic bladder   . Neurogenic bowel   . Detrusor and sphincter dyssynergia   . Urinary incontinence   . Seizure disorder ([place])   . Slow transit constipation   . 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[place])   . Lymphedema   . Hypoalbuminemia due to protein-calorie malnutrition ([place])   . Encounter for screening for cervical cancer  11/27/2015  . Preventative health care 11/25/2015  . History of breast cancer in female 10/28/2015  . Anemia 10/28/2015  . Thrombocytopenia ([place]) 10/28/2015  . Allergic rhinitis 10/28/2015  . IBS (irritable bowel syndrome) 10/28/2015  . 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 History of right mastectomy 10/24/2014  . Medicare annual wellness visit, subsequent 10/24/2014  . MS (multiple sclerosis) ([place]) 09/16/2014  . Hematoma complicating a procedure 03/06/2014  . Primary cancer of right female breast ([place]) 01/10/2014  . SOB (shortness of breath) 03/01/2013  . Bilateral lower extremity edema   . 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 ANKLE SURGERY     Left  . ANKLE SURGERY    . ANTERIOR CERVICAL DECOMP/DISCECTOMY FUSION  11/17/2011   Procedure: ANTERIOR  CERVICAL DECOMPRESSION/DISCECTOMY FUSION 2 LEVELS;  Surgeon: Erline Levine, MD;  Location: [facility];  Service: Neurosurgery;  Laterality: N/A;  Cervical Five-Six Six-Seven Anterior cervical decompression/diskectomy/fusion  . BREAST BIOPSY Right 12-31-13   invasive mammary  . BREAST SURGERY Right 02/03/2014   Right simple mastectomy with sentinel node biopsy.  . CHOLECYSTECTOMY    . COLONOSCOPY  2014  . Lower extremity  venous Dopplers  Feb 27, 2013   No LE DVT  . MASTECTOMY Right 2015  . Port a cath insertion Right 01/19/2010  . PORT-A-CATH REMOVAL     right  . PORT-A-CATH REMOVAL Right 09/03/2013   Procedure: REMOVAL PORT-A-CATH;  Surgeon: Conrad Scranton, MD;  Location: [facility];  Service: Vascular;  Laterality: Right;  . TRANSTHORACIC ECHOCARDIOGRAM  03/2013; 02/2014   a) Normal LV size and function with EF 60-65%.; Cannot exclude bicuspid aortic valve with mild AS and mild AI.; b) Normal EF with normal wall motion and valve function x Mild MR. G2 DD. EF 60-65%. Tricuspid AoV  . UPPER GI ENDOSCOPY  2014    Allergies Fentanyl and Sulfa antibiotics  Social History Social History   Tobacco Use  . Smoking status: Never Smoker  . Smokeless tobacco: Never Used  Substance Use Topics  . Alcohol use: No  . Drug use: No   Review of Systems Constitutional: Negative for fever. Cardiovascular: Negative for chest pain. Respiratory: Negative for shortness of breath. Gastrointestinal: Negative for abdominal pain, vomiting and diarrhea. Genitourinary: Positive for dysuria Musculoskeletal: Positive for leg pain and swelling Skin: Negative for rash. Neurological: Negative for headaches, positive for weakness  All systems negative/normal/unremarkable except as stated in the HPI  ____________________________________________   PHYSICAL EXAM:  VITAL SIGNS: ED Triage Vitals  Enc Vitals Group     BP 02/02/18 1227 (!) 121/98     Pulse Rate 02/02/18 1227 95     Resp 02/02/18 1227 16     Temp 02/02/18 1227 97.8 F (36.6 C)     Temp Source 02/02/18 1227 Oral     SpO2 02/02/18 1227 99 %     Weight 02/02/18 1228 185 lb (83.9 kg)     Height 02/02/18 1228 5\' 4"  (1.626 m)     Head Circumference --      Peak Flow --      Pain Score 02/02/18 1228 8     Pain Loc --      Pain Edu? --      Excl. in [place]? --    Constitutional: Alert and oriented. Well appearing and in no distress. Eyes: Conjunctivae are normal.  Normal extraocular movements. ENT   Head: Normocephalic and atraumatic.   Nose: No congestion/rhinnorhea.   Mouth/Throat: Mucous membranes are moist.   Neck: No stridor. Cardiovascular: Normal rate, regular rhythm. No murmurs, rubs, or gallops. Respiratory: Normal respiratory effort without tachypnea nor retractions. Breath sounds are clear and equal bilaterally. No wheezes/rales/rhonchi. Gastrointestinal: Soft and nontender. Normal bowel sounds Musculoskeletal: Chronic appearing lymphedema in both lower extremities. Neurologic:  Normal speech and language. No gross focal neurologic deficits are appreciated.  Mild weakness when trying to move the legs. Skin:  Skin is warm, dry and intact. No rash noted. Psychiatric: Mood and affect are normal. Speech and behavior are normal.  ____________________________________________  ED COURSE:  As part of my medical decision making, I reviewed the following data within the [place] History obtained from family if available, nursing notes, old chart and ekg, as well as notes from  prior ED visits. Patient presented for worsening lymphedema and urinary symptoms, we will assess with labs and imaging as indicated at this time.   Procedures ____________________________________________   LABS (pertinent positives/negatives)  Labs Reviewed  URINALYSIS, COMPLETE (UACMP) WITH MICROSCOPIC - Abnormal; Notable for the following components:      Result Value   Color, Urine AMBER (*)    APPearance CLOUDY (*)    Protein, ur 30 (*)    Leukocytes, UA MODERATE (*)    All other components within normal limits  CBC WITH DIFFERENTIAL/PLATELET - Abnormal; Notable for the following components:   Hemoglobin 11.8 (*)    HCT 35.8 (*)    Platelets 129 (*)    All other components within normal limits  COMPREHENSIVE METABOLIC PANEL - Abnormal; Notable for the following components:   Glucose, Bld 113 (*)    Creatinine, Ser 0.41 (*)     All other components within normal limits  URINE CULTURE  TROPONIN I  CBG MONITORING, ED    RADIOLOGY  Bilateral lower extremity ultrasound  IMPRESSION: No evidence of DVT within either lower extremity. ____________________________________________  DIFFERENTIAL DIAGNOSIS   Lymphedema, general debility, dehydration, electrolyte abnormality, occult infection, DVT  FINAL ASSESSMENT AND PLAN  Lymphedema, weakness, cystitis   Plan: The patient had presented for increased swelling in her legs and feet with weakness. Patient's labs did indicate a urinary tract infection but was otherwise unremarkable. Patient's imaging was negative.  I will try a low-dose diuretic as well as antibiotics.  Because of this and because of the difficulty with her getting up and down, we will place a Foley catheter and let her go home with 1.  She will be referred to urology for follow-up due to frequent UTIs and to have the catheter removed.   Laurence Aly, MD   Note: This note was generated in part or whole with voice recognition software. Voice recognition is usually quite accurate but there are transcription errors that can and very often do occur. I apologize for any typographical errors that were not detected and corrected.     Earleen Newport, MD 02/02/18 [id number]    Earleen Newport, MD 02/02/18 1520

## 2018-02-02 NOTE — Telephone Encounter (Signed)
okay

## 2018-02-02 NOTE — Telephone Encounter (Signed)
Verbal given to [place] with [facility].

## 2018-02-02 NOTE — ED Triage Notes (Signed)
Patient from home via [facility]. Reports she has had increasing swelling in legs and feet. Reports history of lymphadema but states the swelling and pain is worse than normal. Patient also reports urinary frequency and malodorous urine. States she recently finished antibiotic for UTI.

## 2018-02-02 NOTE — ED Provider Notes (Signed)
Patient unable to ambulate.  Family and patient reporting significant sermons about discharge home.  And so they do not have appropriate assistive devices or ability to manage her at home.  She is unable to ambulate she states due to severe pain in her legs secondary to the swelling.  Does not feel that this is MS flare.  Is the patient is unable to walk refusing discharge at this time we will give IV Lasix and discuss with hospitalist for admission for Lasix and reevaluation.   Merlyn Lot, MD 02/02/18 [phone]

## 2018-02-02 NOTE — ED Notes (Signed)
Port de-accessed by this Therapist, sports. Pt tolerated well. Leg bag placed on pt.

## 2018-02-03 DIAGNOSIS — N3090 Cystitis, unspecified without hematuria: Secondary | ICD-10-CM | POA: Diagnosis not present

## 2018-02-03 DIAGNOSIS — I890 Lymphedema, not elsewhere classified: Secondary | ICD-10-CM | POA: Diagnosis not present

## 2018-02-03 DIAGNOSIS — D649 Anemia, unspecified: Secondary | ICD-10-CM | POA: Diagnosis not present

## 2018-02-03 DIAGNOSIS — G35 Multiple sclerosis: Secondary | ICD-10-CM | POA: Diagnosis not present

## 2018-02-03 LAB — CBC
HCT: 34.4 % — ABNORMAL LOW (ref 36.0–46.0)
Hemoglobin: 11 g/dL — ABNORMAL LOW (ref 12.0–15.0)
MCH: 30 pg (ref 26.0–34.0)
MCHC: 32 g/dL (ref 30.0–36.0)
MCV: 93.7 fL (ref 80.0–100.0)
Platelets: 130 10*3/uL — ABNORMAL LOW (ref 150–400)
RBC: 3.67 MIL/uL — ABNORMAL LOW (ref 3.87–5.11)
RDW: 12.5 % (ref 11.5–15.5)
WBC: 3.9 10*3/uL — ABNORMAL LOW (ref 4.0–10.5)
nRBC: 0 % (ref 0.0–0.2)

## 2018-02-03 LAB — GLUCOSE, CAPILLARY: Glucose-Capillary: 98 mg/dL (ref 70–99)

## 2018-02-03 LAB — BASIC METABOLIC PANEL
Anion gap: 7 (ref 5–15)
BUN: 18 mg/dL (ref 6–20)
CALCIUM: 8.5 mg/dL — AB (ref 8.9–10.3)
CO2: 28 mmol/L (ref 22–32)
Chloride: 102 mmol/L (ref 98–111)
Creatinine, Ser: 0.54 mg/dL (ref 0.44–1.00)
GFR calc Af Amer: >60 mL/min (ref >60)
GFR calc non Af Amer: >60 mL/min (ref >60)
Glucose, Bld: 134 mg/dL — ABNORMAL HIGH (ref 70–99)
Potassium: 3.5 mmol/L (ref 3.5–5.1)
Sodium: 137 mmol/L (ref 135–145)

## 2018-02-03 MED ORDER — BACLOFEN 10 MG PO TABS
10.0000 mg | ORAL_TABLET | Freq: Every day | ORAL | Status: DC
  Administered 2018-02-03 – 2018-02-06 (×4): 10 mg via ORAL
  Filled 2018-02-03 (×4): qty 1

## 2018-02-03 MED ORDER — BACLOFEN 10 MG PO TABS
20.0000 mg | ORAL_TABLET | Freq: Two times a day (BID) | ORAL | Status: DC
  Administered 2018-02-03 – 2018-02-06 (×6): 20 mg via ORAL
  Filled 2018-02-03 (×7): qty 2

## 2018-02-03 NOTE — Evaluation (Signed)
Physical Therapy Evaluation Patient Details Name: Rebecca [name] MRN: [id number] DOB: [birth year] Today's Date: 02/03/2018   History of Present Illness  Patient is a 57 year old female admitted with LE pain and ambulatory dysfunction following lymphedema exacerbation.  PMH includes MS (relapsing remitting, according to pt),   Clinical Impression  Pt is a 57 year old female who lives in a one story home with her husband.  She walks very limited household distances with RW and uses a transport chair at baseline.  Pt required max A for bed mobility and presented with weakness of her L LE which she refers to as her "good side".  R LE presented with only trace muscle contraction and required blocking when pt attempted STS.  Pt was only able to achieve partial STS with max A and expressed significant pain increase in LE's when standing.  Pt will continue to benefit continued PT with focus on strength, tolerance to activity, safe functional mobility and pain management. Pt will benefit from SNF placement following discharge due to decreased function compared to baseline and concern for caregiver support.    Follow Up Recommendations SNF    Equipment Recommendations  None recommended by PT    Recommendations for Other Services       Precautions / Restrictions        Mobility  Bed Mobility Overal bed mobility: Needs Assistance Bed Mobility: Supine to Sit;Sit to Supine     Supine to sit: Max assist Sit to supine: Max assist   General bed mobility comments: Pt able to manage supine to sit with min-mod A at times, using UE's.  Pt expressed pain in LE's throughout transfer. RN assisted in pt return to bed and scooting up.  Transfers Overall transfer level: Needs assistance Equipment used: Rolling walker (2 wheeled) Transfers: Sit to/from Stand Sit to Stand: Max assist         General transfer comment: Pt attempted STS 2x achieving partial STS with WB on L LE and heavy reliance on RW.  PT  blocked R LE which has a tendency to slide forward during transfers.  Ambulation/Gait                Stairs            Wheelchair Mobility    Modified Rankin (Stroke Patients Only)       Balance Overall balance assessment: Needs assistance Sitting-balance support: Bilateral upper extremity supported;Feet supported Sitting balance-Leahy Scale: Poor Sitting balance - Comments: Pt very unsteady, experiencing sudden LOB's laterally which she is able to self correct. Postural control: Left lateral lean Standing balance support: Bilateral upper extremity supported Standing balance-Leahy Scale: Poor                               Pertinent Vitals/Pain Pain Assessment: 0-10 Pain Score: 8  Pain Location: B LE's-generalized pain Pain Descriptors / Indicators: Aching Pain Intervention(s): Limited activity within patient's tolerance;Monitored during session    [place] expects to be discharged to:: Private residence Living Arrangements: Spouse/significant other Available Help at Discharge: Family Type of Home: House Home Access: Ramped entrance     Home Layout: One level Home Equipment: Environmental consultant - 2 wheels;Wheelchair - manual;Bedside commode      Prior Function Level of Independence: Needs assistance   Gait / Transfers Assistance Needed: Uses RW to walk limited household distances and uses transport chair for longer distances.  ADL's / Homemaking  Assistance Needed: Recieves assistance with getting setup for bathing and dressing        Hand Dominance        Extremity/Trunk Assessment   Upper Extremity Assessment Upper Extremity Assessment: Overall WFL for tasks assessed    Lower Extremity Assessment Lower Extremity Assessment: Generalized weakness(R LE: 1/5 grossly; pt refers to R LE as her "dead side" related to MS and R LE is her "good side".)    Cervical / Trunk Assessment Cervical / Trunk Assessment: Normal   Communication   Communication: No difficulties  Cognition Arousal/Alertness: Awake/alert Behavior During Therapy: WFL for tasks assessed/performed Overall Cognitive Status: Within Functional Limits for tasks assessed                                 General Comments: Follows directions consistently      General Comments      Exercises     Assessment/Plan    PT Assessment Patient needs continued PT services  PT Problem List Decreased strength;Decreased mobility;Decreased balance;Pain;Decreased activity tolerance       PT Treatment Interventions DME instruction;Functional mobility training;Balance training;Patient/family education;Gait training;Therapeutic activities;Stair training;Therapeutic exercise    PT Goals (Current goals can be found in the Care Plan section)  Acute Rehab PT Goals Patient Stated Goal: To return to walking short household distances with RW. PT Goal Formulation: With patient Time For Goal Achievement: 02/17/18 Potential to Achieve Goals: Good    Frequency Min 2X/week   Barriers to discharge        Co-evaluation               AM-PAC PT "6 Clicks" Mobility  Outcome Measure Help needed turning from your back to your side while in a flat bed without using bedrails?: A Little Help needed moving from lying on your back to sitting on the side of a flat bed without using bedrails?: A Lot Help needed moving to and from a bed to a chair (including a wheelchair)?: Total Help needed standing up from a chair using your arms (e.g., wheelchair or bedside chair)?: Total Help needed to walk in hospital room?: Total Help needed climbing 3-5 steps with a railing? : Total 6 Click Score: 9    End of Session Equipment Utilized During Treatment: Gait belt Activity Tolerance: Patient limited by fatigue Patient left: in bed;with nursing/sitter in room Nurse Communication: Mobility status PT Visit Diagnosis: Unsteadiness on feet (R26.81);Muscle  weakness (generalized) (M62.81);Pain Pain - Right/Left: Left(bilateral) Pain - part of body: Leg    Time: 1200-1222 PT Time Calculation (min) (ACUTE ONLY): 22 min   Charges:   PT Evaluation $PT Eval Moderate Complexity: 1 Mod          Roxanne Gates, PT, DPT  Roxanne Gates 02/03/2018, 1:17 PM

## 2018-02-03 NOTE — Clinical Social Work Note (Signed)
CSW received consult for SNF placement. CSW will assess when able.  Aristotle Lieb Martha Dewayne Jurek, MSW, LCSWA [phone] 

## 2018-02-03 NOTE — Clinical Social Work Note (Signed)
Clinical Social Work Assessment  Patient Details  Name: Rebecca [name] MRN: [id number] Date of Birth: [birth year]  Date of referral:  02/03/18               Reason for consult:  Facility Placement, Insurance Barriers, Care Management Concerns, Discharge Planning                Permission sought to share information with:  Facility Art therapist granted to share information::     Name::        Agency::     Relationship::     Contact Information:     Housing/Transportation Living arrangements for the past 2 months:  Apartment Source of Information:  Patient, Medical Team, Case Manager, Other (Comment Required) Patient Interpreter Needed:  None Criminal Activity/Legal Involvement Pertinent to Current Situation/Hospitalization:  No - Comment as needed Significant Relationships:  Warehouse manager, Siblings, Other Family Members Lives with:  Spouse Do you feel safe going back to the place where you live?  Yes Need for family participation in patient care:  No (Coment)  Care giving concerns: PT recommendation for SNF.   Social Worker assessment / plan:  The CSW met with the patient, her family friends, and her aunt at bedside to discuss discharge planning. The CSW explained the PT recommendation and the insurance barriers due to observation status. The CSW explained the options for discharge planning including a Medicaid bed at a SNF (with the patient staying for 30 days and relinquishing her disability check to the facility) or HHPT/OT/Aide. The patient shared that she cannot relinquish her disability check as that is the only source of income for the household. The patient stated that she is willing to consider [place] resumption, but would need to discuss with family.   The CSW has contacted [facility] to inquire as to the possibility of a SNF 3 day waiver; however, the patient does not qualify per [facility]. The CSW will continue to follow should the patient choose to pursue  Medicaid payment for SNF.   Employment status:  Disabled (Comment on whether or not currently receiving Disability) Insurance information:  Medicare, Medicaid In [place] PT Recommendations:  [place] / Referral to community resources:     Patient/Family's Response to care: The patient and her family were pleasant but frustrated with insurance barriers.  Patient/Family's Understanding of and Emotional Response to Diagnosis, Current Treatment, and Prognosis: The patient seems to understand the barriers for SNF placement and is willing to consider alternative care such as HH.  Emotional Assessment Appearance:  Appears stated age Attitude/Demeanor/Rapport:  Engaged Affect (typically observed):  Stable, Pleasant, Frustrated Orientation:  Oriented to Self, Oriented to Place, Oriented to  Time, Oriented to Situation Alcohol / Substance use:  Never Used Psych involvement (Current and /or in the community):  No (Comment)  Discharge Needs  Concerns to be addressed:  Care Coordination, Discharge Planning Concerns Readmission within the last 30 days:  No Current discharge risk:  Chronically ill, Physical Impairment Barriers to Discharge:  Continued Medical Work up   [employer], LCSW 02/03/2018, 5:15 PM

## 2018-02-03 NOTE — Progress Notes (Addendum)
[facility] at [place] NAME: Adenike Shidler    MR#:  [id number]  DATE OF BIRTH:  1960/12/06  SUBJECTIVE:   Feels like her lower extremity edema is getting better. Her legs still feel heavy. She was unable to walk with PT. No SOB.  REVIEW OF SYSTEMS:  Review of Systems  Constitutional: Negative for chills and fever.  HENT: Negative for congestion and sore throat.   Eyes: Negative for blurred vision and double vision.  Respiratory: Negative for cough and shortness of breath.   Cardiovascular: Positive for leg swelling. Negative for chest pain and palpitations.  Gastrointestinal: Negative for nausea and vomiting.  Genitourinary: Negative for dysuria and frequency.  Musculoskeletal: Positive for back pain and falls. Negative for neck pain.  Neurological: Negative for dizziness and headaches.  Psychiatric/Behavioral: Negative for depression. The patient is not nervous/anxious.     DRUG ALLERGIES:   Allergies  Allergen Reactions  . Fentanyl Nausea And Vomiting and Nausea Only    vomiting Was given in PACU x3 each time patient got sick vomiting Was given in PACU x3 each time patient got sick  . Sulfa Antibiotics Hives and Other (See Comments)    Light headed, over heated   VITALS:  Blood pressure 121/68, pulse 94, temperature 97.6 F (36.4 C), temperature source Oral, resp. rate 16, height 5\' 4"  (1.626 m), weight 98.7 kg, SpO2 98 %. PHYSICAL EXAMINATION:  Physical Exam  GENERAL:  57 y.o.-year-old patient lying in the bed with no acute distress.  EYES: Pupils equal, round, reactive to light and accommodation. No scleral icterus. Extraocular muscles intact.  HEENT: Head atraumatic, normocephalic. Oropharynx and nasopharynx clear.  NECK:  Supple, no jugular venous distention. No thyroid enlargement, no tenderness.  LUNGS: Normal breath sounds bilaterally, no wheezing, rales,rhonchi or crepitation. No use of accessory muscles of respiration.    CARDIOVASCULAR: S1, S2 normal. No murmurs, rubs, or gallops.  ABDOMEN: Soft, nontender, nondistended. Bowel sounds present. No organomegaly or mass.  EXTREMITIES: +non-pitting edema present bilaterally NEUROLOGIC: Cranial nerves II through XII are intact. +global weakness. Sensation intact. Gait not checked.  PSYCHIATRIC: The patient is alert and oriented x 3.  SKIN: No obvious rash, lesion, or ulcer.  LABORATORY PANEL:  Female CBC Recent Labs  Lab 02/03/18 0500  WBC 3.9*  HGB 11.0*  HCT 34.4*  PLT 130*   ------------------------------------------------------------------------------------------------------------------ Chemistries  Recent Labs  Lab 02/02/18 1237 02/03/18 0500  NA 141 137  K 3.7 3.5  CL 106 102  CO2 26 28  GLUCOSE 113* 134*  BUN 19 18  CREATININE 0.41* 0.54  CALCIUM 9.3 8.5*  AST 25  --   ALT 24  --   ALKPHOS 87  --   BILITOT 0.8  --    RADIOLOGY:  No results found. ASSESSMENT AND PLAN:   Chronic lymphedema- improved with a dose of IV lasix. [place] negative for DVT. -Continue IV lasix for today -Ted hose  History of MS- stable -Continue interferon, baclofen, amantadine  Recent diagnosis of cystitis- UA improved compared to previous -Continue cipro  Chronic normocytic anemia- hemoglobin at baseline -Check anemia panel  Generalized weakness- likely related to MS and deconditioning -PT recommending SNF and patient would like to pursue this -CSW consult for SNF placement  All the records are reviewed and case discussed with Care Management/Social Worker. Management plans discussed with the patient, family and they are in agreement.  CODE STATUS: Full Code  TOTAL TIME TAKING CARE OF THIS  PATIENT: 57 minutes.   More than 50% of the time was spent in counseling/coordination of care: YES  POSSIBLE D/C IN 1-2 DAYS, DEPENDING ON CLINICAL CONDITION.   Berna Spare  M.D on 02/03/2018 at 4:28 PM  Between 7am to 6pm - Pager - [phone]  After 6pm  go to [email or web address] - Proofreader  [facility]  Office  [phone]  CC: Primary care physician; [name], MD  Note: This dictation was prepared with Dragon dictation along with smaller phrase technology. Any transcriptional errors that result from this process are unintentional.

## 2018-02-03 NOTE — Care Management Obs Status (Signed)
[facility] NOTIFICATION   Patient Details  Name: Rebecca [name] MRN: [id number] Date of Birth: 1960/03/22   Medicare Observation Status Notification Given:  Yes    Devantae Babe A Quame Spratlin, RN 02/03/2018, 11:50 AM

## 2018-02-04 DIAGNOSIS — G35 Multiple sclerosis: Secondary | ICD-10-CM | POA: Diagnosis not present

## 2018-02-04 DIAGNOSIS — N3090 Cystitis, unspecified without hematuria: Secondary | ICD-10-CM | POA: Diagnosis not present

## 2018-02-04 DIAGNOSIS — I890 Lymphedema, not elsewhere classified: Secondary | ICD-10-CM | POA: Diagnosis not present

## 2018-02-04 DIAGNOSIS — D649 Anemia, unspecified: Secondary | ICD-10-CM | POA: Diagnosis not present

## 2018-02-04 LAB — CBC
HCT: 37.2 % (ref 36.0–46.0)
Hemoglobin: 12.1 g/dL (ref 12.0–15.0)
MCH: 30.2 pg (ref 26.0–34.0)
MCHC: 32.5 g/dL (ref 30.0–36.0)
MCV: 92.8 fL (ref 80.0–100.0)
Platelets: 142 10*3/uL — ABNORMAL LOW (ref 150–400)
RBC: 4.01 MIL/uL (ref 3.87–5.11)
RDW: 12.2 % (ref 11.5–15.5)
WBC: 4.6 10*3/uL (ref 4.0–10.5)
nRBC: 0 % (ref 0.0–0.2)

## 2018-02-04 LAB — FOLATE: Folate: 31 ng/mL (ref >5.9)

## 2018-02-04 LAB — URINE CULTURE: Culture: NO GROWTH

## 2018-02-04 LAB — BASIC METABOLIC PANEL
Anion gap: 10 (ref 5–15)
BUN: 17 mg/dL (ref 6–20)
CO2: 29 mmol/L (ref 22–32)
Calcium: 9.5 mg/dL (ref 8.9–10.3)
Chloride: 98 mmol/L (ref 98–111)
Creatinine, Ser: 0.54 mg/dL (ref 0.44–1.00)
GFR calc Af Amer: >60 mL/min (ref >60)
GFR calc non Af Amer: >60 mL/min (ref >60)
Glucose, Bld: 129 mg/dL — ABNORMAL HIGH (ref 70–99)
Potassium: 3.2 mmol/L — ABNORMAL LOW (ref 3.5–5.1)
Sodium: 137 mmol/L (ref 135–145)

## 2018-02-04 LAB — FERRITIN: Ferritin: 98 ng/mL (ref 11–307)

## 2018-02-04 LAB — RETICULOCYTES
Immature Retic Fract: 7.1 % (ref 2.3–15.9)
RBC.: 4.01 MIL/uL (ref 3.87–5.11)
Retic Count, Absolute: 44.9 10*3/uL (ref 19.0–186.0)
Retic Ct Pct: 1.1 % (ref 0.4–3.1)

## 2018-02-04 LAB — IRON AND TIBC
Iron: 71 ug/dL (ref 28–170)
Saturation Ratios: 22 % (ref 10.4–31.8)
TIBC: 330 ug/dL (ref 250–450)
UIBC: 259 ug/dL

## 2018-02-04 LAB — VITAMIN B12: Vitamin B-12: 261 pg/mL (ref 180–914)

## 2018-02-04 LAB — GLUCOSE, CAPILLARY: Glucose-Capillary: 117 mg/dL — ABNORMAL HIGH (ref 70–99)

## 2018-02-04 MED ORDER — FUROSEMIDE 10 MG/ML IJ SOLN
40.0000 mg | Freq: Every day | INTRAMUSCULAR | Status: DC
  Administered 2018-02-05: 40 mg via INTRAVENOUS
  Filled 2018-02-04: qty 4

## 2018-02-04 MED ORDER — ALUM & MAG HYDROXIDE-SIMETH 200-200-20 MG/5ML PO SUSP
30.0000 mL | Freq: Four times a day (QID) | ORAL | Status: DC | PRN
  Administered 2018-02-04: 10:00:00 30 mL via ORAL
  Filled 2018-02-04: qty 30

## 2018-02-04 NOTE — Progress Notes (Signed)
[facility] at [place] NAME: Rebecca [name]    MR#:  [id number]  DATE OF BIRTH:  1960-12-18  SUBJECTIVE:   Patient admitted to the hospital secondary to progressive lower extremity swelling.  Swelling improved with 1 dose of IV Lasix, Dopplers are negative for DVT.  Patient still having some generalized weakness and difficulty walking.  Patient's husband is at bedside.  REVIEW OF SYSTEMS:    Review of Systems  Constitutional: Negative for chills and fever.  HENT: Negative for congestion and tinnitus.   Eyes: Negative for blurred vision and double vision.  Respiratory: Negative for cough, shortness of breath and wheezing.   Cardiovascular: Negative for chest pain, orthopnea and PND.  Gastrointestinal: Negative for abdominal pain, diarrhea, nausea and vomiting.  Genitourinary: Negative for dysuria and hematuria.  Musculoskeletal: Positive for falls.  Neurological: Positive for weakness (generalized). Negative for dizziness, sensory change and focal weakness.  All other systems reviewed and are negative.   Nutrition: heart healthy Tolerating Diet: yes Tolerating PT: Eval noted.    DRUG ALLERGIES:   Allergies  Allergen Reactions  . Fentanyl Nausea And Vomiting and Nausea Only    vomiting Was given in PACU x3 each time patient got sick vomiting Was given in PACU x3 each time patient got sick  . Sulfa Antibiotics Hives and Other (See Comments)    Light headed, over heated    VITALS:  Blood pressure 132/72, pulse 83, temperature 98.2 F (36.8 C), temperature source Oral, resp. rate 16, height 5\' 4"  (1.626 m), weight 97.5 kg, SpO2 96 %.  PHYSICAL EXAMINATION:   Physical Exam  GENERAL:  57 y.o.-year-old patient lying in bed in no acute distress.  EYES: Pupils equal, round, reactive to light and accommodation. No scleral icterus. Extraocular muscles intact.  HEENT: Head atraumatic, normocephalic. Oropharynx and nasopharynx clear.    NECK:  Supple, no jugular venous distention. No thyroid enlargement, no tenderness.  LUNGS: Normal breath sounds bilaterally, no wheezing, rales, rhonchi. No use of accessory muscles of respiration.  CARDIOVASCULAR: S1, S2 normal. No murmurs, rubs, or gallops.  ABDOMEN: Soft, nontender, nondistended. Bowel sounds present. No organomegaly or mass.  EXTREMITIES: No cyanosis, clubbing, +1-2 edema b/l NEUROLOGIC: Cranial nerves II through XII are intact. No focal Motor or sensory deficits b/l.  Globally weak PSYCHIATRIC: The patient is alert and oriented x 3.  SKIN: No obvious rash, lesion, or ulcer.    LABORATORY PANEL:   CBC Recent Labs  Lab 02/04/18 0513  WBC 4.6  HGB 12.1  HCT 37.2  PLT 142*   ------------------------------------------------------------------------------------------------------------------  Chemistries  Recent Labs  Lab 02/02/18 1237  02/04/18 0513  NA 141   < > 137  K 3.7   < > 3.2*  CL 106   < > 98  CO2 26   < > 29  GLUCOSE 113*   < > 129*  BUN 19   < > 17  CREATININE 0.41*   < > 0.54  CALCIUM 9.3   < > 9.5  AST 25  --   --   ALT 24  --   --   ALKPHOS 87  --   --   BILITOT 0.8  --   --    < > = values in this interval not displayed.   ------------------------------------------------------------------------------------------------------------------  Cardiac Enzymes Recent Labs  Lab 02/02/18 1237  TROPONINI <0.03   ------------------------------------------------------------------------------------------------------------------  RADIOLOGY:  US Venous Img Lower Bilateral  Result Date:  02/02/2018 CLINICAL DATA:  Bilateral lower extremity pain and edema for the past 2 days. History of chronic lymphedema. History of breast cancer. Evaluate for DVT. EXAM: BILATERAL LOWER EXTREMITY VENOUS DOPPLER ULTRASOUND TECHNIQUE: Gray-scale sonography with graded compression, as well as color Doppler and duplex ultrasound were performed to evaluate the lower  extremity deep venous systems from the level of the common femoral vein and including the common femoral, femoral, profunda femoral, popliteal and calf veins including the posterior tibial, peroneal and gastrocnemius veins when visible. The superficial great saphenous vein was also interrogated. Spectral Doppler was utilized to evaluate flow at rest and with distal augmentation maneuvers in the common femoral, femoral and popliteal veins. COMPARISON:  None. FINDINGS: RIGHT LOWER EXTREMITY Common Femoral Vein: No evidence of thrombus. Normal compressibility, respiratory phasicity and response to augmentation. Saphenofemoral Junction: No evidence of thrombus. Normal compressibility and flow on color Doppler imaging. Profunda Femoral Vein: No evidence of thrombus. Normal compressibility and flow on color Doppler imaging. Femoral Vein: No evidence of thrombus. Normal compressibility, respiratory phasicity and response to augmentation. Popliteal Vein: Appears patent where imaged. Calf Veins: No evidence of thrombus. Normal compressibility and flow on color Doppler imaging. Superficial Great Saphenous Vein: No evidence of thrombus. Normal compressibility. Other Findings:  None. LEFT LOWER EXTREMITY Common Femoral Vein: No evidence of thrombus. Normal compressibility, respiratory phasicity and response to augmentation. Saphenofemoral Junction: No evidence of thrombus. Normal compressibility and flow on color Doppler imaging. Profunda Femoral Vein: No evidence of thrombus. Normal compressibility and flow on color Doppler imaging. Femoral Vein: No evidence of thrombus. Normal compressibility, respiratory phasicity and response to augmentation. Popliteal Vein: No evidence of thrombus. Normal compressibility, respiratory phasicity and response to augmentation. Calf Veins: Appear patent where imaged. Superficial Great Saphenous Vein: No evidence of thrombus. Normal compressibility. Other Findings: Subcutaneous edema is noted at  the level the left calf (image 69). IMPRESSION: No evidence of DVT within either lower extremity. Electronically Signed   By: Sandi Mariscal M.D.   On: 02/02/2018 14:47     ASSESSMENT AND PLAN:   57 year old female with past medical history of multiple sclerosis, history of UTI, IBS, breast cancer, who presented to the hospital due to worsening lower extremity edema and difficulty ambulating.  # Chronic lymphedema-suspect this is likely dependent edema from poor mobility/deconditioning - improved with a dose of IV lasix. [place] negative for DVT. - cont. IV lasix but will taper dose.  - cont. Ted hose  # History of MS- stable and no acute flare up.  -Continue interferon, baclofen, amantadine  # Recent diagnosis of cystitis- UA improved compared to previous -Continue cipro  # Chronic normocytic anemia- hemoglobin at baseline - likely due to anemia of chronic disease. No need for transfusion presently.   # Generalized weakness- likely related to MS and deconditioning -PT recommending SNF and discussed with social work and pt. Would have to use her Disability income to pay for this and she cannot afford to do that now. Will likely need to maximize home health services upon discharge.   # urinary incontinence - cont. Oxybutynin.   # hx of seizures - no acute seizures - cont. Keppra.    All the records are reviewed and case discussed with Care Management/Social Worker. Management plans discussed with the patient, family and they are in agreement.  CODE STATUS: Full code  DVT Prophylaxis: Lovenox  TOTAL TIME TAKING CARE OF THIS PATIENT: 30 minutes.   POSSIBLE D/C IN 1-2 DAYS, DEPENDING ON CLINICAL CONDITION.  Henreitta Leber M.D on 02/04/2018 at 2:28 PM  Between 7am to 6pm - Pager - [phone]  After 6pm go to [email or web address] - Proofreader  [facility]  Office  [phone]  CC: Primary care physician; [name], MD

## 2018-02-05 ENCOUNTER — Inpatient Hospital Stay: Payer: Medicare Other

## 2018-02-05 DIAGNOSIS — Z803 Family history of malignant neoplasm of breast: Secondary | ICD-10-CM | POA: Diagnosis not present

## 2018-02-05 DIAGNOSIS — Z87440 Personal history of urinary (tract) infections: Secondary | ICD-10-CM | POA: Diagnosis not present

## 2018-02-05 DIAGNOSIS — Z9049 Acquired absence of other specified parts of digestive tract: Secondary | ICD-10-CM | POA: Diagnosis not present

## 2018-02-05 DIAGNOSIS — R600 Localized edema: Secondary | ICD-10-CM | POA: Diagnosis present

## 2018-02-05 DIAGNOSIS — M2550 Pain in unspecified joint: Secondary | ICD-10-CM | POA: Diagnosis not present

## 2018-02-05 DIAGNOSIS — R262 Difficulty in walking, not elsewhere classified: Secondary | ICD-10-CM | POA: Diagnosis not present

## 2018-02-05 DIAGNOSIS — R5381 Other malaise: Secondary | ICD-10-CM | POA: Diagnosis not present

## 2018-02-05 DIAGNOSIS — Z885 Allergy status to narcotic agent status: Secondary | ICD-10-CM | POA: Diagnosis not present

## 2018-02-05 DIAGNOSIS — M5127 Other intervertebral disc displacement, lumbosacral region: Secondary | ICD-10-CM | POA: Diagnosis not present

## 2018-02-05 DIAGNOSIS — Z7401 Bed confinement status: Secondary | ICD-10-CM | POA: Diagnosis not present

## 2018-02-05 DIAGNOSIS — R569 Unspecified convulsions: Secondary | ICD-10-CM | POA: Diagnosis present

## 2018-02-05 DIAGNOSIS — D649 Anemia, unspecified: Secondary | ICD-10-CM | POA: Diagnosis not present

## 2018-02-05 DIAGNOSIS — G35 Multiple sclerosis: Secondary | ICD-10-CM | POA: Diagnosis not present

## 2018-02-05 DIAGNOSIS — I890 Lymphedema, not elsewhere classified: Principal | ICD-10-CM

## 2018-02-05 DIAGNOSIS — M47816 Spondylosis without myelopathy or radiculopathy, lumbar region: Secondary | ICD-10-CM | POA: Diagnosis not present

## 2018-02-05 DIAGNOSIS — G9389 Other specified disorders of brain: Secondary | ICD-10-CM | POA: Diagnosis not present

## 2018-02-05 DIAGNOSIS — Z853 Personal history of malignant neoplasm of breast: Secondary | ICD-10-CM | POA: Diagnosis not present

## 2018-02-05 DIAGNOSIS — K589 Irritable bowel syndrome without diarrhea: Secondary | ICD-10-CM | POA: Diagnosis present

## 2018-02-05 DIAGNOSIS — Z882 Allergy status to sulfonamides status: Secondary | ICD-10-CM | POA: Diagnosis not present

## 2018-02-05 DIAGNOSIS — Z23 Encounter for immunization: Secondary | ICD-10-CM | POA: Diagnosis not present

## 2018-02-05 DIAGNOSIS — I359 Nonrheumatic aortic valve disorder, unspecified: Secondary | ICD-10-CM | POA: Diagnosis present

## 2018-02-05 DIAGNOSIS — R531 Weakness: Secondary | ICD-10-CM | POA: Diagnosis not present

## 2018-02-05 DIAGNOSIS — M7989 Other specified soft tissue disorders: Secondary | ICD-10-CM | POA: Diagnosis not present

## 2018-02-05 DIAGNOSIS — D638 Anemia in other chronic diseases classified elsewhere: Secondary | ICD-10-CM | POA: Diagnosis present

## 2018-02-05 DIAGNOSIS — Z79899 Other long term (current) drug therapy: Secondary | ICD-10-CM | POA: Diagnosis not present

## 2018-02-05 DIAGNOSIS — Z9011 Acquired absence of right breast and nipple: Secondary | ICD-10-CM | POA: Diagnosis not present

## 2018-02-05 DIAGNOSIS — N3090 Cystitis, unspecified without hematuria: Secondary | ICD-10-CM | POA: Diagnosis not present

## 2018-02-05 DIAGNOSIS — E860 Dehydration: Secondary | ICD-10-CM | POA: Diagnosis present

## 2018-02-05 LAB — HIV ANTIBODY (ROUTINE TESTING W REFLEX): HIV Screen 4th Generation wRfx: NONREACTIVE

## 2018-02-05 LAB — GLUCOSE, CAPILLARY: Glucose-Capillary: 102 mg/dL — ABNORMAL HIGH (ref 70–99)

## 2018-02-05 MED ORDER — FUROSEMIDE 20 MG PO TABS
20.0000 mg | ORAL_TABLET | Freq: Every day | ORAL | Status: DC
  Administered 2018-02-05 – 2018-02-06 (×2): 20 mg via ORAL
  Filled 2018-02-05 (×2): qty 1

## 2018-02-05 MED ORDER — POTASSIUM CHLORIDE CRYS ER 20 MEQ PO TBCR
40.0000 meq | EXTENDED_RELEASE_TABLET | Freq: Once | ORAL | Status: AC
  Administered 2018-02-05: 40 meq via ORAL
  Filled 2018-02-05: qty 2

## 2018-02-05 MED ORDER — GADOBUTROL 1 MMOL/ML IV SOLN
10.0000 mL | Freq: Once | INTRAVENOUS | Status: AC | PRN
  Administered 2018-02-05: 20:00:00 10 mL via INTRAVENOUS

## 2018-02-05 NOTE — Progress Notes (Signed)
[facility] [place] NAME: Rebecca [name]    MR#:  [id number]  DATE OF BIRTH:  08/27/1960  SUBJECTIVE:   No acute events overnight, family at bedside.  Still having significant weakness and difficulty ambulating.LE edema has improved.   REVIEW OF SYSTEMS:    Review of Systems  Constitutional: Negative for chills and fever.  HENT: Negative for congestion and tinnitus.   Eyes: Negative for blurred vision and double vision.  Respiratory: Negative for cough, shortness of breath and wheezing.   Cardiovascular: Negative for chest pain, orthopnea and PND.  Gastrointestinal: Negative for abdominal pain, diarrhea, nausea and vomiting.  Genitourinary: Negative for dysuria and hematuria.  Musculoskeletal: Positive for falls.  Neurological: Positive for weakness (generalized). Negative for dizziness, sensory change and focal weakness.  All other systems reviewed and are negative.   Nutrition: heart healthy Tolerating Diet: yes Tolerating PT: Eval noted.    DRUG ALLERGIES:   Allergies  Allergen Reactions  . Fentanyl Nausea And Vomiting and Nausea Only    vomiting Was given in PACU x3 each time patient got sick vomiting Was given in PACU x3 each time patient got sick  . Sulfa Antibiotics Hives and Other (See Comments)    Light headed, over heated    VITALS:  Blood pressure 117/67, pulse 96, temperature 98.1 F (36.7 C), temperature source Oral, resp. rate 14, height 5\' 4"  (1.626 m), weight 99 kg, SpO2 95 %.  PHYSICAL EXAMINATION:   Physical Exam  GENERAL:  57 y.o.-year-old patient lying in bed in no acute distress.  EYES: Pupils equal, round, reactive to light and accommodation. No scleral icterus. Extraocular muscles intact.  HEENT: Head atraumatic, normocephalic. Oropharynx and nasopharynx clear.  NECK:  Supple, no jugular venous distention. No thyroid enlargement, no tenderness.  LUNGS: Normal breath sounds bilaterally, no  wheezing, rales, rhonchi. No use of accessory muscles of respiration.  CARDIOVASCULAR: S1, S2 normal. No murmurs, rubs, or gallops.  ABDOMEN: Soft, nontender, nondistended. Bowel sounds present. No organomegaly or mass.  EXTREMITIES: No cyanosis, clubbing, +1-2 dependent edema b/l NEUROLOGIC: Cranial nerves II through XII are intact. No focal Motor or sensory deficits b/l.  Globally weak PSYCHIATRIC: The patient is alert and oriented x 3.  SKIN: No obvious rash, lesion, or ulcer.    LABORATORY PANEL:   CBC Recent Labs  Lab 02/04/18 0513  WBC 4.6  HGB 12.1  HCT 37.2  PLT 142*   ------------------------------------------------------------------------------------------------------------------  Chemistries  Recent Labs  Lab 02/02/18 1237  02/04/18 0513  NA 141   < > 137  K 3.7   < > 3.2*  CL 106   < > 98  CO2 26   < > 29  GLUCOSE 113*   < > 129*  BUN 19   < > 17  CREATININE 0.41*   < > 0.54  CALCIUM 9.3   < > 9.5  AST 25  --   --   ALT 24  --   --   ALKPHOS 87  --   --   BILITOT 0.8  --   --    < > = values in this interval not displayed.   ------------------------------------------------------------------------------------------------------------------  Cardiac Enzymes Recent Labs  Lab 02/02/18 1237  TROPONINI <0.03   ------------------------------------------------------------------------------------------------------------------  RADIOLOGY:  No results found.   ASSESSMENT AND PLAN:   57 year old female with past medical history of multiple sclerosis, history of UTI, IBS, breast cancer, who presented to the hospital  due to worsening lower extremity edema and difficulty ambulating.  # Chronic lymphedema-suspect this is likely dependent edema from poor mobility/deconditioning  improved with a dose of IV lasix. [place] negative for DVT. -We will DC IV Lasix, switch to oral Lasix today.  Continue supportive care with TED hose  # History of MS-patient continues to  have significant lower extremity weakness and difficulty ambulating which has been progressive over the past 2 weeks.  Unclear if this is related to a MS flareup. - I will get a neurology consult, discussed with Dr. Doy Mince.  MRI of the brain and lumbar spine has been ordered as per neurology.  -Continue interferon, baclofen, amantadine  # Recent diagnosis of cystitis- UA improved compared to previous - off abx for now. Asymptomatic. Would d/c foley today.   # Chronic normocytic anemia- hemoglobin at baseline - likely due to anemia of chronic disease. No need for transfusion presently.   # Generalized weakness- likely related to MS and deconditioning -PT recommending SNF and discussed with social work and pt. Would have to use her Disability income to pay for this and she does not want to do that now. Will likely need to maximize home health services upon discharge.   # urinary incontinence - cont. Oxybutynin.   # hx of seizures - no acute seizures - cont. Keppra.    All the records are reviewed and case discussed with Care Management/Social Worker. Management plans discussed with the patient, family and they are in agreement.  CODE STATUS: Full code  DVT Prophylaxis: Lovenox  TOTAL TIME TAKING CARE OF THIS PATIENT: 30 minutes.   POSSIBLE D/C IN 1-2 DAYS, DEPENDING ON CLINICAL CONDITION.   Henreitta Leber M.D on 02/05/2018 at 2:34 PM  Between 7am to 6pm - Pager - [phone]  After 6pm go to [email or web address] - Proofreader  [facility]  Office  [phone]  CC: Primary care physician; [name], MD

## 2018-02-05 NOTE — Progress Notes (Signed)
Clinical Education officer, museum (CSW) met with patient at her request to discuss D/C plan. Per patient her aunt had questions about SNF placement however her aunt has left [facility] for the day. CSW explained that patient is under medicare observation, which means medicare will not pay for SNF. CSW explained that medicare requires a 3 night qualifying inpatient hospital stay in order for SNF to be covered. Per patient she does have medicaid and receives a disability check. CSW explained that patient would have to sign over her disability check to the SNF in order for medicaid to pay for SNF. Per patient she can't sign over her disability check. CSW explained that the only option is going home at this point. Patient verbalized her understanding. Patient was very pleasant and thanked CSW for visit.   [employer], LCSW [phone]

## 2018-02-05 NOTE — Consult Note (Signed)
Reason for Consult:LE weakness Referring Physician: Verdell Carmine  CC: LE weakness  HPI: Rebecca [name] is an 57 y.o. female with a long history of MS maintained on Rebif who reports LE weakness.  Patient reports that she has baseline lower extremity weakness but is able to transfer and ambulate short distances in the house.  She lost that function over the past 5 days prior to admission.  Also reports that during that period of time her lower extremity swelling has progressively worsened.  The swelling did predate the weakness.  Patient reports that she has had difficulty with ambulation related to her lower extremity weakness in the past but never to this extent.  Reports her exacerbations in the past have usually been related to her lower extremities but she has never required IV steroids in treatment.  Reports RLE is chronically weak.   Patient has no complaints of bowel or bladder incontinence.  Followed by Dr. Kelli Churn on an outpatient basis.  Imaging in 2018 shows cerebral as well as cord MS lesions.    Past Medical History:  Diagnosis Date  . Aortic valve disease    Mild AS / AI - most recent Echo demonstrated tricuspid aortic valve.  . Bacterial endocarditis    History of .  Marland Kitchen Bilateral lower extremity edema    Noncardiac.  Chronic. LE Venous dopplers - negative for DVT.; Echocardiogram January 2016: Normal EF with normal wall motion and valve function. Only grade 1 diastolic dysfunction. EF 60-65%. Mild MR  . Breast cancer ([place]) 12-31-13   Right breast, 12:00, 1.5 cm, T1c,N0 invasive mammary carcinoma, triple negative. --> Rx with Chemo  . Cervical stenosis of spine   . Herpes zoster   . IBS (irritable bowel syndrome)   . Lymphedema    has legs wrapped at [facility]  . Multiple sclerosis ([place]) 2001   Walks from room to room @ home; but Wheelchair when going out.  Marland Kitchen Neuromuscular disorder ([place])    MS  . Seizures ([place])    Takes Keppra  . Syncope and collapse     Past Surgical History:   Procedure Laterality Date  . ANKLE SURGERY     Left  . ANKLE SURGERY    . ANTERIOR CERVICAL DECOMP/DISCECTOMY FUSION  11/17/2011   Procedure: ANTERIOR CERVICAL DECOMPRESSION/DISCECTOMY FUSION 2 LEVELS;  Surgeon: Erline Levine, MD;  Location: [facility];  Service: Neurosurgery;  Laterality: N/A;  Cervical Five-Six Six-Seven Anterior cervical decompression/diskectomy/fusion  . BREAST BIOPSY Right 12-31-13   invasive mammary  . BREAST SURGERY Right 02/03/2014   Right simple mastectomy with sentinel node biopsy.  . CHOLECYSTECTOMY    . COLONOSCOPY  2014  . Lower extremity venous Dopplers  Feb 27, 2013   No LE DVT  . MASTECTOMY Right 2015  . Port a cath insertion Right 01/19/2010  . PORT-A-CATH REMOVAL     right  . PORT-A-CATH REMOVAL Right 09/03/2013   Procedure: REMOVAL PORT-A-CATH;  Surgeon: Conrad West Livingston, MD;  Location: [facility];  Service: Vascular;  Laterality: Right;  . TRANSTHORACIC ECHOCARDIOGRAM  03/2013; 02/2014   a) Normal LV size and function with EF 60-65%.; Cannot exclude bicuspid aortic valve with mild AS and mild AI.; b) Normal EF with normal wall motion and valve function x Mild MR. G2 DD. EF 60-65%. Tricuspid AoV  . UPPER GI ENDOSCOPY  2014    Family History  Problem Relation Age of Onset  . Cancer Father        skin  . Heart  disease Father   . Heart attack Father        heart attack in his 83's  . Thyroid disease Sister   . Ovarian cancer Cousin   . Breast cancer Maternal Aunt 60  . Breast cancer Maternal Grandmother 97  . Bladder Cancer Neg Hx   . Kidney cancer Neg Hx     Social History:  reports that she has never smoked. She has never used smokeless tobacco. She reports that she does not drink alcohol or use drugs.  Allergies  Allergen Reactions  . Fentanyl Nausea And Vomiting and Nausea Only    vomiting Was given in PACU x3 each time patient got sick vomiting Was given in PACU x3 each time patient got sick  . Sulfa Antibiotics Hives and Other (See  Comments)    Light headed, over heated    Medications:  I have reviewed the patient's current medications. Prior to Admission:  Medications Prior to Admission  Medication Sig Dispense Refill Last Dose  . amantadine (SYMMETREL) 100 MG capsule Take 1 capsule (100 mg total) by mouth 3 (three) times daily. 270 capsule 4 02/02/2018 at 0800  . baclofen (LIORESAL) 10 MG tablet TAKE 1 TO 2 TABLETS BY MOUTH 3 TIMES A DAY (Patient taking differently: Take 10-20 mg by mouth See admin instructions. Take 2 tablets (20MG ) by mouth every morning, 1 tablet (10MG ) by mouth every day at lunchtime and 2 tablets (20MG ) by mouth every evening) 180 tablet 4 02/02/2018 at 0800  . diclofenac sodium (VOLTAREN) 1 % GEL Apply 2 g topically 4 (four) times daily as needed. Apply to knees 100 g 1 Unknown at PRN  . Disposable Gloves (ASSURANCE VINYL EXAM GLOVES) MISC Neurogenic bladder; MS; LON 99 months; for use for personal hygiene 50 each 11   . glucosamine-chondroitin 500-400 MG tablet Take 1 tablet by mouth 4 (four) times daily.   02/02/2018 at 0800  . ibuprofen (ADVIL,MOTRIN) 600 MG tablet Take 1 tablet (600 mg total) by mouth every 8 (eight) hours as needed. 180 tablet 0 Unknown at PRN  . Incontinence Supply Disposable (PREVAIL WET WIPES) MISC Apply 1 each topically daily as needed. (dx neurogenic bladder N31.9, LON 99 months) 96 each 11   . Interferon Beta-1a (REBIF REBIDOSE) 44 MCG/0.5ML SOAJ Inject 0.5 mLs into the skin 3 (three) times a week. (Patient taking differently: Inject 0.5 mLs into the skin every Monday, Wednesday, and Friday. ) 12 mL 11 01/31/2018 at 2100  . levETIRAcetam (KEPPRA) 500 MG tablet Take 1 tablet (500 mg total) by mouth 2 (two) times daily. 180 tablet 4 02/02/2018 at 0800  . loratadine (CLARITIN) 10 MG tablet Take 10 mg by mouth daily.   02/02/2018 at 0800  . Misc Natural Products (LEG VEIN & CIRCULATION) TABS Take 1 tablet by mouth as directed.   02/01/2018 at 1200  . Multiple  Vitamins-Minerals (HAIR SKIN AND NAILS FORMULA) TABS Take 1 tablet by mouth 2 (two) times daily.   02/01/2018 at 2000  . Multiple Vitamins-Minerals (MULTIVITAMIN PO) Take 1 tablet by mouth daily.    02/02/2018 at 0800  . Multiple Vitamins-Minerals (PRESERVISION AREDS 2) CAPS Take 1 capsule by mouth 2 (two) times daily.   02/01/2018 at 2000  . oxybutynin (DITROPAN-XL) 10 MG 24 hr tablet Take 2 tablets (20 mg total) by mouth daily. 180 tablet 3 02/02/2018 at 0800  . tiZANidine (ZANAFLEX) 4 MG tablet TAKE 1/2 TABLETS (2 MG TOTAL) BY MOUTH 2 (TWO) TIMES DAILY AS NEEDED (SPASTICITY UNCONTROLLED). (  Patient taking differently: Take 2 mg by mouth 2 (two) times daily as needed for muscle spasms. ) 30 tablet 1 Past Week at PRN  . Turmeric 500 MG CAPS Take 500 mg by mouth daily.   02/02/2018 at 0800  . vitamin C (ASCORBIC ACID) 500 MG tablet Take 500 mg by mouth daily.   02/01/2018 at 1200  . ciprofloxacin (CIPRO) 500 MG tablet Take 1 tablet (500 mg total) by mouth 2 (two) times daily. (Patient not taking: Reported on 02/02/2018) 14 tablet 0 Not Taking at Unknown time  . nitrofurantoin (MACRODANTIN) 100 MG capsule Take 1 capsule (100 mg total) by mouth 2 (two) times daily. (Patient not taking: Reported on 12/26/2017) 14 capsule 0 Not Taking at Unknown time   Scheduled: . amantadine  100 mg Oral TID  . baclofen  10 mg Oral Q lunch  . baclofen  20 mg Oral BID  . docusate sodium  100 mg Oral BID  . enoxaparin (LOVENOX) injection  40 mg Subcutaneous Q24H  . furosemide  40 mg Intravenous Daily  . Interferon Beta-1a  0.5 mL Subcutaneous Once per day on Mon Wed Fri  . levETIRAcetam  500 mg Oral BID  . multivitamin with minerals  1 tablet Oral BID  . oxybutynin  20 mg Oral Daily    ROS: History obtained from the patient  General ROS: negative for - chills, fatigue, fever, night sweats, weight gain or weight loss Psychological ROS: negative for - behavioral disorder, hallucinations, memory difficulties, mood  swings or suicidal ideation Ophthalmic ROS: negative for - blurry vision, double vision, eye pain or loss of vision ENT ROS: negative for - epistaxis, nasal discharge, oral lesions, sore throat, tinnitus or vertigo Allergy and Immunology ROS: negative for - hives or itchy/watery eyes Hematological and Lymphatic ROS: negative for - bleeding problems, bruising or swollen lymph nodes Endocrine ROS: negative for - galactorrhea, hair pattern changes, polydipsia/polyuria or temperature intolerance Respiratory ROS: negative for - cough, hemoptysis, shortness of breath or wheezing Cardiovascular ROS: negative for - chest pain, dyspnea on exertion, or irregular heartbeat Gastrointestinal ROS: negative for - abdominal pain, diarrhea, hematemesis, nausea/vomiting or stool incontinence Genito-Urinary ROS: negative for - dysuria, hematuria, incontinence or urinary frequency/urgency Musculoskeletal ROS: leg swelling Neurological ROS: as noted in HPI Dermatological ROS: negative for rash and skin lesion changes  Physical Examination: Blood pressure 117/67, pulse 96, temperature 98.1 F (36.7 C), temperature source Oral, resp. rate 14, height 5\' 4"  (1.626 m), weight 99 kg, SpO2 95 %.  HEENT-  Normocephalic, no lesions, without obvious abnormality.  Normal external eye and conjunctiva.  Normal TM's bilaterally.  Normal auditory canals and external ears. Normal external nose, mucus membranes and septum.  Normal pharynx. Cardiovascular- S1, S2 normal, pulses palpable throughout   Lungs- chest clear, no wheezing, rales, normal symmetric air entry Abdomen- soft, non-tender; bowel sounds normal; no masses,  no organomegaly Extremities- BLE swelling Lymph-no adenopathy palpable Musculoskeletal-no pain to palpation Skin-erythematous changes in the lower extremities bilaterally  Neurological Examination   Mental Status: Alert, oriented, thought content appropriate.  Speech fluent without evidence of aphasia.   Able to follow 3 step commands without difficulty. Cranial Nerves: II: Discs flat bilaterally; Visual fields grossly normal, pupils equal, round, reactive to light and accommodation III,IV, VI: ptosis not present, extra-ocular motions intact bilaterally V,VII: smile symmetric, facial light touch sensation normal bilaterally VIII: hearing normal bilaterally IX,X: gag reflex present XI: bilateral shoulder shrug XII: midline tongue extension Motor: 5/5 in the BUE's.  Able to lift the left leg off the bed a couple of inches.  Unable to lift the right leg off the bed.  Weak knee flexion noted on the left with 5/5 plantar flexion and extension.  Unable to flex the knee on the right and 0-1/5 plantar flexion on the right Sensory: Pinprick and light touch intact throughout, bilaterally Deep Tendon Reflexes: 2+ in the upper extremities and absent in the lower extremities.   Plantars: Right: upgoing   Left: upgoing Cerebellar: Normal finger-to-nose testing in the upper extremities.  Unable to be tested in the lower extremities due to weakness Gait: Unable to test due to safety concerns    Laboratory Studies:   Basic Metabolic Panel: Recent Labs  Lab 02/02/18 1237 02/03/18 0500 02/04/18 0513  NA 141 137 137  K 3.7 3.5 3.2*  CL 106 102 98  CO2 26 28 29   GLUCOSE 113* 134* 129*  BUN 19 18 17   CREATININE 0.41* 0.54 0.54  CALCIUM 9.3 8.5* 9.5    Liver Function Tests: Recent Labs  Lab 02/02/18 1237  AST 25  ALT 24  ALKPHOS 87  BILITOT 0.8  PROT 7.2  ALBUMIN 4.1   No results for input(s): LIPASE, AMYLASE in the last 168 hours. No results for input(s): AMMONIA in the last 168 hours.  CBC: Recent Labs  Lab 02/02/18 1237 02/03/18 0500 02/04/18 0513  WBC 4.3 3.9* 4.6  NEUTROABS 2.8  --   --   HGB 11.8* 11.0* 12.1  HCT 35.8* 34.4* 37.2  MCV 92.0 93.7 92.8  PLT 129* 130* 142*    Cardiac Enzymes: Recent Labs  Lab 02/02/18 1237  TROPONINI <0.03    BNP: Invalid input(s):  POCBNP  CBG: Recent Labs  Lab 02/03/18 0749 02/04/18 0802 02/05/18 0733  GLUCAP 98 117* 102*    Microbiology: Results for orders placed or performed during the hospital encounter of 02/02/18  Urine Culture     Status: None   Collection Time: 02/02/18  1:26 PM  Result Value Ref Range Status   Specimen Description   Final    URINE, RANDOM Performed at [facility], [address] [place], [place] [address]    Special Requests   Final    NONE Performed at [facility], [address], [place], [place] [address]    Culture   Final    NO GROWTH Performed at [facility], [place] [address] [place], [place] [address]    Report Status 02/04/2018 FINAL  Final    Coagulation Studies: No results for input(s): LABPROT, INR in the last 72 hours.  Urinalysis:  Recent Labs  Lab 02/02/18 1231  COLORURINE AMBER*  LABSPEC 1.025  PHURINE 5.0  GLUCOSEU NEGATIVE  HGBUR NEGATIVE  BILIRUBINUR NEGATIVE  KETONESUR NEGATIVE  PROTEINUR 30*  NITRITE NEGATIVE  LEUKOCYTESUR MODERATE*    Lipid Panel:     Component Value Date/Time   CHOL 214 (H) 11/25/2015 1216   TRIG 109 11/25/2015 1216   HDL 58 11/25/2015 1216   CHOLHDL 3.7 11/25/2015 1216   VLDL 22 11/25/2015 1216   LDLCALC 134 (H) 11/25/2015 1216    HgbA1C: No results found for: HGBA1C  Urine Drug Screen:  No results found for: LABOPIA, COCAINSCRNUR, LABBENZ, AMPHETMU, THCU, LABBARB  Alcohol Level: No results for input(s): ETH in the last 168 hours.  Imaging: No results found.   Assessment/Plan: 57 year old female with a history of MS presenting with LE weakness and edema.  From review of  the records this is a long standing issue for the patient although she reports she is clearly less functional at this time.  Unclear if lower extremity weakness related to increased edema or MS exacerbation.  No evidence of infection.  Further work up recommended.    Recommendations: 1.  MRI of the brain and  lumbar spine with and without contrast 2.  If evidence of active lesions, IV solumedrol indicated.  Otherwise would continue to manage conservatively with management of edema and physical therapy.   3.  May need to reconsider continued use of Amantadine of swelling continues to be an issue.    Alexis Goodell, MD Neurology [phone] 02/05/2018, 1:18 PM

## 2018-02-05 NOTE — Progress Notes (Signed)
Physical Therapy Treatment Patient Details Name: Rebecca [name] MRN: [id number] DOB: 1960/09/29 Today's Date: 02/05/2018    History of Present Illness Patient is a 57 year old female admitted with LE pain and ambulatory dysfunction following lymphedema exacerbation.  PMH includes MS (relapsing remitting, according to pt), seizures, breast CA and aortic valve disease.    PT Comments    Patient progressed in STS posture and pain report today but still requires overall mod-total assist for all mobility.  Pt able to sit at EOB with fair posture and PT blocking L LE to prevent anterior LOB.  She did complete some seated there ex with L LE but was quick to fatigue.  Pt not able to achieve mobility of R LE in any capacity.  Pt attempted STS 3x and was able to stand upright with mod-max A.  She was able to stand for ~15 sec at a time before fatiguing.  PT and RN assisted pt to return to bed with total A due to fatigue.  Pt will continue to benefit from skilled PT with focus on strength, tolerance to activity, safe functional mobility.  SNF placement is still appropriate for discharge planning but pt plans to return home due to financial concerns.  She will need a lift for transfer assist in this case.  Follow Up Recommendations  SNF(Recommendation remains SNF, but pt will likely discharge to home with home health due to financial concerns.  Pt will need a lift to assist with transfers in this case.)     Equipment Recommendations  None recommended by PT    Recommendations for Other Services       Precautions / Restrictions Precautions Precautions: Fall Restrictions Weight Bearing Restrictions: No    Mobility  Bed Mobility Overal bed mobility: Needs Assistance Bed Mobility: Supine to Sit;Sit to Supine     Supine to sit: Mod assist Sit to supine: Total assist   General bed mobility comments: Pt better able to initiate LE movement to get to EOB today.  Still required mod A to sit upright and  to gain independence with balance.   RN assisted in pt return to bed and scooting up which pt was a total assist for due to fatigue.  Transfers Overall transfer level: Needs assistance Equipment used: Rolling walker (2 wheeled) Transfers: Sit to/from Stand Sit to Stand: Max assist         General transfer comment: Pt attempted STS 3x achieving more upright posture with WB on L LE and heavy reliance on RW.  PT blocked R LE which has a tendency to slide forward during transfers.  Pt unable to initiate steps at this time.  Ambulation/Gait                 Stairs             Wheelchair Mobility    Modified Rankin (Stroke Patients Only)       Balance Overall balance assessment: Needs assistance Sitting-balance support: Bilateral upper extremity supported;Feet supported Sitting balance-Leahy Scale: Poor Sitting balance - Comments: Pt very unsteady, experiencing sudden LOB's laterally which she is able to self correct.  PT blocked L LE to ensure that pt did not slide forward off EOB. Postural control: Left lateral lean Standing balance support: Bilateral upper extremity supported Standing balance-Leahy Scale: Poor Standing balance comment: Heavily reliant on RW and PT assistance to stand  Cognition Arousal/Alertness: Awake/alert Behavior During Therapy: WFL for tasks assessed/performed Overall Cognitive Status: Within Functional Limits for tasks assessed                                 General Comments: Follows directions consistently      Exercises General Exercises - Lower Extremity Ankle Circles/Pumps: Both;10 reps;Seated;Strengthening Quad Sets: (Unable to initiate) Long Arc Quad: Strengthening;Left;5 reps;Seated(Fatigued at 5 reps)    General Comments        Pertinent Vitals/Pain Pain Assessment: Faces Faces Pain Scale: Hurts a little bit Pain Location: Pt states that LE pain is decreased today as  compared to yesterday. Pain Descriptors / Indicators: Aching Pain Intervention(s): Limited activity within patient's tolerance    Home Living                      Prior Function            PT Goals (current goals can now be found in the care plan section) Acute Rehab PT Goals Patient Stated Goal: To return to walking short household distances with RW. PT Goal Formulation: With patient Time For Goal Achievement: 02/17/18 Potential to Achieve Goals: Good Progress towards PT goals: Progressing toward goals    Frequency    Min 2X/week      PT Plan Current plan remains appropriate    Co-evaluation              AM-PAC PT "6 Clicks" Mobility   Outcome Measure  Help needed turning from your back to your side while in a flat bed without using bedrails?: A Lot Help needed moving from lying on your back to sitting on the side of a flat bed without using bedrails?: A Lot Help needed moving to and from a bed to a chair (including a wheelchair)?: Total Help needed standing up from a chair using your arms (e.g., wheelchair or bedside chair)?: Total Help needed to walk in hospital room?: Total Help needed climbing 3-5 steps with a railing? : Total 6 Click Score: 8    End of Session Equipment Utilized During Treatment: Gait belt Activity Tolerance: Patient limited by fatigue Patient left: in bed;with bed alarm set;with family/visitor present;with call bell/phone within reach Nurse Communication: Mobility status PT Visit Diagnosis: Unsteadiness on feet (R26.81);Muscle weakness (generalized) (M62.81);Pain Pain - Right/Left: Left(bilateral) Pain - part of body: Leg     Time: 7939-0300 PT Time Calculation (min) (ACUTE ONLY): 28 min  Charges:  $Therapeutic Exercise: 8-22 mins $Therapeutic Activity: 8-22 mins                     Roxanne Gates, PT, DPT    Roxanne Gates 02/05/2018, 11:45 AM

## 2018-02-06 DIAGNOSIS — I890 Lymphedema, not elsewhere classified: Secondary | ICD-10-CM | POA: Diagnosis not present

## 2018-02-06 DIAGNOSIS — N3090 Cystitis, unspecified without hematuria: Secondary | ICD-10-CM | POA: Diagnosis not present

## 2018-02-06 DIAGNOSIS — R262 Difficulty in walking, not elsewhere classified: Secondary | ICD-10-CM

## 2018-02-06 DIAGNOSIS — D649 Anemia, unspecified: Secondary | ICD-10-CM | POA: Diagnosis not present

## 2018-02-06 DIAGNOSIS — G35 Multiple sclerosis: Secondary | ICD-10-CM | POA: Diagnosis not present

## 2018-02-06 LAB — GLUCOSE, CAPILLARY: Glucose-Capillary: 92 mg/dL (ref 70–99)

## 2018-02-06 LAB — URINALYSIS, ROUTINE W REFLEX MICROSCOPIC
Bilirubin Urine: NEGATIVE
Glucose, UA: NEGATIVE mg/dL
Hgb urine dipstick: NEGATIVE
Ketones, ur: NEGATIVE mg/dL
Nitrite: NEGATIVE
Protein, ur: NEGATIVE mg/dL
Specific Gravity, Urine: 1.018 (ref 1.005–1.030)
pH: 7 (ref 5.0–8.0)

## 2018-02-06 MED ORDER — BISACODYL 10 MG RE SUPP
10.0000 mg | Freq: Once | RECTAL | Status: AC
  Administered 2018-02-06: 10 mg via RECTAL
  Filled 2018-02-06: qty 1

## 2018-02-06 MED ORDER — HEPARIN SOD (PORK) LOCK FLUSH 100 UNIT/ML IV SOLN
INTRAVENOUS | Status: AC
  Administered 2018-02-06: 18:00:00
  Filled 2018-02-06: qty 5

## 2018-02-06 NOTE — Progress Notes (Signed)
Subjective: Patient reports feeling somewhat stronger today but still feels she is less functional than at baseline.    Objective: Current vital signs: BP 134/78 (BP Location: Left Arm)   Pulse 97   Temp 98 F (36.7 C) (Oral)   Resp 16   Ht 5\' 4"  (1.626 m)   Wt 100.2 kg   SpO2 94%   BMI 37.92 kg/m  Vital signs in last 24 hours: Temp:  [98 F (36.7 C)-98.1 F (36.7 C)] 98 F (36.7 C) (12/31 0454) Pulse Rate:  [89-97] 97 (12/31 0454) Resp:  [14-16] 16 (12/30 2144) BP: (113-134)/(61-78) 134/78 (12/31 0454) SpO2:  [94 %-96 %] 94 % (12/31 0454) Weight:  [100.2 kg] 100.2 kg (12/31 0500)  Intake/Output from previous day: 12/30 0701 - 12/31 0700 In: 120 [P.O.:120] Out: 1200 [Urine:1200] Intake/Output this shift: Total I/O In: 120 [P.O.:120] Out: 600 [Urine:600] Nutritional status:  Diet Order            Diet Heart Room service appropriate? Yes; Fluid consistency: Thin  Diet effective now              Neurologic Exam: Mental Status: Alert, oriented, thought content appropriate.  Speech fluent without evidence of aphasia.  Able to follow 3 step commands without difficulty. Cranial Nerves: II: Discs flat bilaterally; Visual fields grossly normal, pupils equal, round, reactive to light and accommodation III,IV, VI: ptosis not present, extra-ocular motions intact bilaterally V,VII: smile symmetric, facial light touch sensation normal bilaterally VIII: hearing normal bilaterally IX,X: gag reflex present XI: bilateral shoulder shrug XII: midline tongue extension Motor: 5/5 in the BUE's.  Able to lift both lower extremities weakly off the bed Sensory: Pinprick and light touch intact throughout, bilaterally  Lab Results: Basic Metabolic Panel: Recent Labs  Lab 02/02/18 1237 02/03/18 0500 02/04/18 0513  NA 141 137 137  K 3.7 3.5 3.2*  CL 106 102 98  CO2 26 28 29   GLUCOSE 113* 134* 129*  BUN 19 18 17   CREATININE 0.41* 0.54 0.54  CALCIUM 9.3 8.5* 9.5    Liver  Function Tests: Recent Labs  Lab 02/02/18 1237  AST 25  ALT 24  ALKPHOS 87  BILITOT 0.8  PROT 7.2  ALBUMIN 4.1   No results for input(s): LIPASE, AMYLASE in the last 168 hours. No results for input(s): AMMONIA in the last 168 hours.  CBC: Recent Labs  Lab 02/02/18 1237 02/03/18 0500 02/04/18 0513  WBC 4.3 3.9* 4.6  NEUTROABS 2.8  --   --   HGB 11.8* 11.0* 12.1  HCT 35.8* 34.4* 37.2  MCV 92.0 93.7 92.8  PLT 129* 130* 142*    Cardiac Enzymes: Recent Labs  Lab 02/02/18 1237  TROPONINI <0.03    Lipid Panel: No results for input(s): CHOL, TRIG, HDL, CHOLHDL, VLDL, LDLCALC in the last 168 hours.  CBG: Recent Labs  Lab 02/03/18 0749 02/04/18 0802 02/05/18 0733 02/06/18 0817  GLUCAP 98 117* 102* 91    Microbiology: Results for orders placed or performed during the hospital encounter of 02/02/18  Urine Culture     Status: None   Collection Time: 02/02/18  1:26 PM  Result Value Ref Range Status   Specimen Description   Final    URINE, RANDOM Performed at [facility], [address] [place], [place] [address]    Special Requests   Final    NONE Performed at [facility], [address] [place], [place] [address]    Culture   Final  NO GROWTH Performed at [facility], [place] [address], [place], [place] [address]    Report Status 02/04/2018 FINAL  Final    Coagulation Studies: No results for input(s): LABPROT, INR in the last 72 hours.  Imaging: Mr Jeri Cos JM Contrast  Result Date: 02/05/2018 CLINICAL DATA:  58 y/o F; lower extremity weakness. History of multiple sclerosis. EXAM: MRI HEAD WITHOUT AND WITH CONTRAST TECHNIQUE: Multiplanar, multiecho pulse sequences of the brain and surrounding structures were obtained without and with intravenous contrast. CONTRAST:  10 cc Gadavist. COMPARISON:  05/14/2016 MRI of the head. FINDINGS: Brain: Numerous white matter lesions are present within pericallosal white matter with a radial  configuration relative to the ventricles, juxta cortical white matter, pons, right cerebral peduncle, left cerebellum subcortical white matter, posterior limbs of internal capsules, the right anterior limb of internal capsule, as well as under belly of corpus callosum. Several lesions demonstrate T1 hypointensity compatible with myelin loss. White matter lesions appear stable in comparison with the of the head. After administration of intravenous contrast there is no definite abnormal enhancement. No reduced diffusion to suggest acute or early subacute infarction. No extra-axial collection, hydrocephalus, mass effect, or herniation. Vascular: Normal flow voids. Skull and upper cervical spine: Normal marrow signal. Sinuses/Orbits: Negative. Other: None. IMPRESSION: Stable extensive white matter hyperintensities compatible with history of multiple sclerosis. No new lesion or enhancement identified to suggest an active process. Electronically Signed   By: Kristine Garbe M.D.   On: 02/05/2018 20:01   Mr Lumbar Spine W Wo Contrast  Result Date: 02/05/2018 CLINICAL DATA:  57 y/o F; lower extremity weakness. History of multiple sclerosis. EXAM: MRI LUMBAR SPINE WITHOUT AND WITH CONTRAST TECHNIQUE: Multiplanar and multiecho pulse sequences of the lumbar spine were obtained without and with intravenous contrast. CONTRAST:  10 cc Gadavist COMPARISON:  None. FINDINGS: Segmentation:  Standard. Alignment:  Physiologic. Vertebrae: No fracture, evidence of discitis, or bone lesion. T12 and L2 stable subcentimeter T1/T2 hyperintense foci compatible with hemangiomata. No abnormal enhancement. Conus medullaris and cauda equina: Conus extends to the L1-2 level. Stable T12 cord lesion likely representing a demyelinating plaque visible on the sagittal sequences without enhancement. Normal appearance of cauda equina without enhancement. Paraspinal and other soft tissues: Negative. Disc levels: L1-2: No significant disc  displacement, foraminal stenosis, or canal stenosis. L2-3: No significant disc displacement, foraminal stenosis, or canal stenosis. L3-4: Stable small disc bulge. No significant foraminal or canal stenosis. L4-5: Stable mild disc bulge eccentric to the left and facet hypertrophy. Mild left foraminal stenosis. No canal stenosis. L5-S1: Stable mild disc bulge eccentric to the left. Interval diminution of left subarticular disc protrusion. Mild facet hypertrophy. No significant foraminal or canal stenosis. IMPRESSION: 1. Stable T12 cord lesion likely representing demyelinating plaque. No abnormal enhancement. 2. Diminution of left L5-S1 subarticular disc protrusion. Otherwise stable mild lumbar spine spondylosis. No significant foraminal or canal stenosis. Electronically Signed   By: Kristine Garbe M.D.   On: 02/05/2018 20:07    Medications:  I have reviewed the patient's current medications. Scheduled: . amantadine  100 mg Oral TID  . baclofen  10 mg Oral Q lunch  . baclofen  20 mg Oral BID  . bisacodyl  10 mg Rectal Once  . docusate sodium  100 mg Oral BID  . enoxaparin (LOVENOX) injection  40 mg Subcutaneous Q24H  . furosemide  20 mg Oral Daily  . Interferon Beta-1a  0.5 mL Subcutaneous Once per day on Mon Wed Fri  .  levETIRAcetam  500 mg Oral BID  . multivitamin with minerals  1 tablet Oral BID  . oxybutynin  20 mg Oral Daily    Assessment/Plan: Strength improved today.  MRI of the brain and lumbar spine reviewed and show stable MS lesions.  No evidence of active disease noted.  Suspect decreased functional status related to LE swelling.  High dose steroids not indicated at this time.  Recommendations: 1.  Continue therapy 2.  No further neurologic intervention is recommended at this time.  If further questions arise, please call or page at that time.  Thank you for allowing neurology to participate in the care of this patient.  Patient to follow up with her outpatient neurologist  after discharge.    Alexis Goodell, MD Neurology [phone] 02/06/2018  12:33 PM   LOS: 1 day   Alexis Goodell, MD Neurology [phone] 02/06/2018  11:46 AM

## 2018-02-06 NOTE — Clinical Social Work Note (Signed)
CSW notified by RN that patient would like to speak to her. CSW met with patient to discuss questions. Patient is questioning why she is considered observation instead of inpatient this hospitalization when she was here about a year ago with the same problems and was inpatient at that time. CSW explained that unfortunately she does not know why it is different this hospital stay but she is aware that patient's chart is being evaluated daily for medical necessity. CSW also explained that patient has been changed to inpatient but that she would not qualify for a 3 midnight stay for Medicare because there is not a medically necessary reason for patient to stay in the hospital. Patient states that she understands and thanked CSW for answering questions.   [employer], [place]

## 2018-02-06 NOTE — Progress Notes (Signed)
Physical Therapy Treatment Patient Details Name: Rebecca [name] MRN: [id number] DOB: 03-22-1960 Today's Date: 02/06/2018    History of Present Illness 57 year old female admitted with LE pain and ambulatory dysfunction following lymphedema exacerbation.  PMH includes MS (relapsing remitting, according to pt), seizures, breast CA and aortic valve disease.    PT Comments    Pt showed good effort and attitude t/o PT session despite being frustrated with her situation and pensive about being able to manage at home.  Discussed multiple scenarios and possibilities about being able to manage at home.  In order to be safe at home a hospital bed will be needed as she is unable to lay flat (sleeps in lift recliner) and at this time is unable to safely pivot transfer to a commode, etc w/o excessive assist.  Alteratively we discussed a lift, however practically a bed seems more appropriate.  Pt showed great effort with exercises despite being very weak, R LE very limit and functionally unable to assist a lot with mobility and with PF tone causing issues re: standing posture.    Follow Up Recommendations  SNF(financials will likely mean HHPT with DME)     Equipment Recommendations  Hospital bed(or lift secondary to inability to transfer safely)    Recommendations for Other Services       Precautions / Restrictions Precautions Precautions: Fall Restrictions Weight Bearing Restrictions: No    Mobility  Bed Mobility Overal bed mobility: Needs Assistance Bed Mobility: Supine to Sit;Sit to Supine     Supine to sit: Mod assist Sit to supine: Max assist   General bed mobility comments: Pt was able to help with L LE and using HHA to pull torso up, but needed a lot of assist with R LE and to fully get to sitting posture  Transfers Overall transfer level: Needs assistance Equipment used: Rolling walker (2 wheeled) Transfers: Sit to/from Stand Sit to Stand: Max assist         General transfer  comment: Pt needed elevated bed height and significant assist to get to and maintain standing.    Ambulation/Gait             General Gait Details: Unsafe to step away from EOB, able to do a few very labored, shuffling, non-confident side steps along EOB with heavy assist to maitnain standing and to manipulate walker   Stairs             Wheelchair Mobility    Modified Rankin (Stroke Patients Only)       Balance Overall balance assessment: Needs assistance   Sitting balance-Leahy Scale: Poor Sitting balance - Comments: Pt lost balance multiple times sitting at EOB and did need frequest and at time substantial assist to remain sitting     Standing balance-Leahy Scale: Zero Standing balance comment: Heavily reliant on RW and PT assistance to stand                            Cognition Arousal/Alertness: Awake/alert Behavior During Therapy: [facility] for tasks assessed/performed Overall Cognitive Status: Within Functional Limits for tasks assessed                                        Exercises General Exercises - Lower Extremity Ankle Circles/Pumps: PROM;AROM;10 reps;Both(PROM stretching on L) Quad Sets: Strengthening;10 reps Short Arc Quad: AROM;AAROM;10 reps  Heel Slides: AROM;Strengthening;10 reps(with resisted leg extension)    General Comments        Pertinent Vitals/Pain Pain Assessment: No/denies pain    Home Living                      Prior Function            PT Goals (current goals can now be found in the care plan section) Progress towards PT goals: Progressing toward goals    Frequency    Min 2X/week      PT Plan Current plan remains appropriate    Co-evaluation              AM-PAC PT "6 Clicks" Mobility   Outcome Measure  Help needed turning from your back to your side while in a flat bed without using bedrails?: A Lot Help needed moving from lying on your back to sitting on the side of  a flat bed without using bedrails?: Total Help needed moving to and from a bed to a chair (including a wheelchair)?: Total Help needed standing up from a chair using your arms (e.g., wheelchair or bedside chair)?: Total Help needed to walk in hospital room?: Total Help needed climbing 3-5 steps with a railing? : Total 6 Click Score: 7    End of Session Equipment Utilized During Treatment: Gait belt Activity Tolerance: Patient limited by fatigue Patient left: in bed;with bed alarm set;with family/visitor present;with call bell/phone within reach Nurse Communication: Mobility status PT Visit Diagnosis: Unsteadiness on feet (R26.81);Muscle weakness (generalized) (M62.81)     Time: 1561-5379 PT Time Calculation (min) (ACUTE ONLY): 30 min  Charges:  $Therapeutic Exercise: 8-22 mins $Therapeutic Activity: 8-22 mins                     Kreg Shropshire, DPT 02/06/2018, 3:53 PM

## 2018-02-06 NOTE — Discharge Summary (Signed)
[facility] NAME: Rebecca [name]    MR#:  [id number]  DATE OF BIRTH:  1960-08-24  DATE OF ADMISSION:  02/02/2018 ADMITTING PHYSICIAN: Epifanio Lesches, MD  DATE OF DISCHARGE: 02/06/2018  PRIMARY CARE PHYSICIAN: [name], MD    ADMISSION DIAGNOSIS:  Lymphedema [I89.0] Cystitis [N30.90]  DISCHARGE DIAGNOSIS:  Active Problems:   Ambulatory dysfunction   SECONDARY DIAGNOSIS:   Past Medical History:  Diagnosis Date  . Aortic valve disease    Mild AS / AI - most recent Echo demonstrated tricuspid aortic valve.  . Bacterial endocarditis    History of .  Marland Kitchen Bilateral lower extremity edema    Noncardiac.  Chronic. LE Venous dopplers - negative for DVT.; Echocardiogram January 2016: Normal EF with normal wall motion and valve function. Only grade 1 diastolic dysfunction. EF 60-65%. Mild MR  . Breast cancer ([place]) 12-31-13   Right breast, 12:00, 1.5 cm, T1c,N0 invasive mammary carcinoma, triple negative. --> Rx with Chemo  . Cervical stenosis of spine   . Herpes zoster   . IBS (irritable bowel syndrome)   . Lymphedema    has legs wrapped at [facility]  . Multiple sclerosis ([place]) 2001   Walks from room to room @ home; but Wheelchair when going out.  Marland Kitchen Neuromuscular disorder ([place])    MS  . Seizures ([place])    Takes Keppra  . Syncope and collapse     HOSPITAL COURSE:   57 year old female with past medical history of multiple sclerosis, history of UTI, IBS, breast cancer, who presented to the hospital due to worsening lower extremity edema and difficulty ambulating.  # Chronic lymphedema-suspect this is likely dependent edema from poor mobility/deconditioning  -This improved with some IV Lasix and patient is being discharged on oral Lasix and supportive care with TED hose.  Ultrasound of the lower extremities were negative for DVT.  # History of MS-patient had some progressive lower extremity weakness and difficulty walking,  therefore a neurology consult was obtained to see if this was progressive MS.  Patient was seen by Dr. Doy Mince, underwent MRI of the brain and lumbar spine which shows chronic findings suggestive of MS but no acute flareup. -Patient will continue her interferon, baclofen and amantadine.  Continue follow-up with outpatient neurology.  # Recent diagnosis of cystitis-patient had repeat urinalysis which was negative for UTI. -She prefers to have a Foley catheter placed for comfort due to his significant debility.  Explained to the patient that she would be a high risk for having UTIs with a Foley catheter in place and she is aware of this and would prefer to still be discharged with a Foley catheter.    # Chronic normocytic anemia- hemoglobin at baseline - likely due to anemia of chronic disease and Hg. Can be further followed as outpatient.   # Generalized weakness- likely related to MS and deconditioning - Seen by physical therapy and they recommended short-term rehab but due to financial concerns she cannot go to short-term rehab.  She is being discharged home with [facility] health services including PT, nurse, aide, social work.  # urinary incontinence - she will cont. Oxybutynin.   # hx of seizures - no acute seizures - she will cont. Keppra.   DISCHARGE CONDITIONS:   Stable.   CONSULTS OBTAINED:  Treatment Team:  Alexis Goodell, MD  DRUG ALLERGIES:   Allergies  Allergen Reactions  . Fentanyl Nausea And Vomiting and Nausea Only  vomiting Was given in PACU x3 each time patient got sick vomiting Was given in PACU x3 each time patient got sick  . Sulfa Antibiotics Hives and Other (See Comments)    Light headed, over heated    DISCHARGE MEDICATIONS:   Allergies as of 02/06/2018      Reactions   Fentanyl Nausea And Vomiting, Nausea Only   vomiting Was given in PACU x3 each time patient got sick vomiting Was given in PACU x3 each time patient got sick   Sulfa  Antibiotics Hives, Other (See Comments)   Light headed, over heated      Medication List    STOP taking these medications   ciprofloxacin 500 MG tablet Commonly known as:  CIPRO   nitrofurantoin 100 MG capsule Commonly known as:  MACRODANTIN     TAKE these medications   amantadine 100 MG capsule Commonly known as:  SYMMETREL Take 1 capsule (100 mg total) by mouth 3 (three) times daily.   ASSURANCE VINYL EXAM GLOVES Misc Neurogenic bladder; MS; LON 99 months; for use for personal hygiene   baclofen 10 MG tablet Commonly known as:  LIORESAL TAKE 1 TO 2 TABLETS BY MOUTH 3 TIMES A DAY What changed:    how much to take  how to take this  when to take this  additional instructions   diclofenac sodium 1 % Gel Commonly known as:  VOLTAREN Apply 2 g topically 4 (four) times daily as needed. Apply to knees   furosemide 20 MG tablet Commonly known as:  LASIX Take 1 tablet (20 mg total) by mouth daily for 7 days.   glucosamine-chondroitin 500-400 MG tablet Take 1 tablet by mouth 4 (four) times daily.   ibuprofen 600 MG tablet Commonly known as:  ADVIL,MOTRIN Take 1 tablet (600 mg total) by mouth every 8 (eight) hours as needed.   Interferon Beta-1a 44 MCG/0.5ML Soaj Commonly known as:  REBIF REBIDOSE Inject 0.5 mLs into the skin 3 (three) times a week. What changed:  when to take this   LEG VEIN & CIRCULATION Tabs Take 1 tablet by mouth as directed.   levETIRAcetam 500 MG tablet Commonly known as:  KEPPRA Take 1 tablet (500 mg total) by mouth 2 (two) times daily.   loratadine 10 MG tablet Commonly known as:  CLARITIN Take 10 mg by mouth daily.   MULTIVITAMIN PO Take 1 tablet by mouth daily.   HAIR SKIN AND NAILS FORMULA Tabs Take 1 tablet by mouth 2 (two) times daily.   PRESERVISION AREDS 2 Caps Take 1 capsule by mouth 2 (two) times daily.   oxybutynin 10 MG 24 hr tablet Commonly known as:  DITROPAN-XL Take 2 tablets (20 mg total) by mouth daily.    PREVAIL WET WIPES Misc Apply 1 each topically daily as needed. (dx neurogenic bladder N31.9, LON 99 months)   tiZANidine 4 MG tablet Commonly known as:  ZANAFLEX TAKE 1/2 TABLETS (2 MG TOTAL) BY MOUTH 2 (TWO) TIMES DAILY AS NEEDED (SPASTICITY UNCONTROLLED). What changed:  See the new instructions.   Turmeric 500 MG Caps Take 500 mg by mouth daily.   vitamin C 500 MG tablet Commonly known as:  ASCORBIC ACID Take 500 mg by mouth daily.         DISCHARGE INSTRUCTIONS:   DIET:  Regular diet  DISCHARGE CONDITION:  Stable  ACTIVITY:  Activity as tolerated  OXYGEN:  Home Oxygen: No.   Oxygen Delivery: room air  DISCHARGE LOCATION:  Home with Home Health  PT, RN, Aide, Social Work.    If you experience worsening of your admission symptoms, develop shortness of breath, life threatening emergency, suicidal or homicidal thoughts you must seek medical attention immediately by calling 911 or calling your MD immediately  if symptoms less severe.  You Must read complete instructions/literature along with all the possible adverse reactions/side effects for all the Medicines you take and that have been prescribed to you. Take any new Medicines after you have completely understood and accpet all the possible adverse reactions/side effects.   Please note  You were cared for by a hospitalist during your hospital stay. If you have any questions about your discharge medications or the care you received while you were in the hospital after you are discharged, you can call the unit and asked to speak with the hospitalist on call if the hospitalist that took care of you is not available. Once you are discharged, your primary care physician will handle any further medical issues. Please note that NO REFILLS for any discharge medications will be authorized once you are discharged, as it is imperative that you return to your primary care physician (or establish a relationship with a primary care  physician if you do not have one) for your aftercare needs so that they can reassess your need for medications and monitor your lab values.     Today   No acute events overnight, MRI of the brain and lumbar spine showing no evidence of acute flareup of MS.  VITAL SIGNS:  Blood pressure 134/78, pulse 97, temperature 98 F (36.7 C), temperature source Oral, resp. rate 16, height 5\' 4"  (1.626 m), weight 100.2 kg, SpO2 94 %.  I/O:    Intake/Output Summary (Last 24 hours) at 02/06/2018 1610 Last data filed at 02/06/2018 1356 Gross per 24 hour  Intake 360 ml  Output 600 ml  Net -240 ml    PHYSICAL EXAMINATION:   GENERAL:  57 y.o.-year-old patient lying in bed in no acute distress.  EYES: Pupils equal, round, reactive to light and accommodation. No scleral icterus. Extraocular muscles intact.  HEENT: Head atraumatic, normocephalic. Oropharynx and nasopharynx clear.  NECK:  Supple, no jugular venous distention. No thyroid enlargement, no tenderness.  LUNGS: Normal breath sounds bilaterally, no wheezing, rales, rhonchi. No use of accessory muscles of respiration.  CARDIOVASCULAR: S1, S2 normal. No murmurs, rubs, or gallops.  ABDOMEN: Soft, nontender, nondistended. Bowel sounds present. No organomegaly or mass.  EXTREMITIES: No cyanosis, clubbing, +1 dependent edema b/l NEUROLOGIC: Cranial nerves II through XII are intact. No focal Motor or sensory deficits b/l.  Globally weak PSYCHIATRIC: The patient is alert and oriented x 3.  SKIN: No obvious rash, lesion, or ulcer.  DATA REVIEW:   CBC Recent Labs  Lab 02/04/18 0513  WBC 4.6  HGB 12.1  HCT 37.2  PLT 142*    Chemistries  Recent Labs  Lab 02/02/18 1237  02/04/18 0513  NA 141   < > 137  K 3.7   < > 3.2*  CL 106   < > 98  CO2 26   < > 29  GLUCOSE 113*   < > 129*  BUN 19   < > 17  CREATININE 0.41*   < > 0.54  CALCIUM 9.3   < > 9.5  AST 25  --   --   ALT 24  --   --   ALKPHOS 87  --   --   BILITOT 0.8  --   --     < > =  values in this interval not displayed.    Cardiac Enzymes Recent Labs  Lab 02/02/18 1237  TROPONINI <0.03    Microbiology Results  Results for orders placed or performed during the hospital encounter of 02/02/18  Urine Culture     Status: None   Collection Time: 02/02/18  1:26 PM  Result Value Ref Range Status   Specimen Description   Final    URINE, RANDOM Performed at [facility], [address] [place], [place] [address]    Special Requests   Final    NONE Performed at [facility], [address], [place], [place] [address]    Culture   Final    NO GROWTH Performed at [facility], [place] [address] [place],  [address]    Report Status 02/04/2018 FINAL  Final    RADIOLOGY:  Mr Jeri Cos XT Contrast  Result Date: 02/05/2018 CLINICAL DATA:  57 y/o F; lower extremity weakness. History of multiple sclerosis. EXAM: MRI HEAD WITHOUT AND WITH CONTRAST TECHNIQUE: Multiplanar, multiecho pulse sequences of the brain and surrounding structures were obtained without and with intravenous contrast. CONTRAST:  10 cc Gadavist. COMPARISON:  05/14/2016 MRI of the head. FINDINGS: Brain: Numerous white matter lesions are present within pericallosal white matter with a radial configuration relative to the ventricles, juxta cortical white matter, pons, right cerebral peduncle, left cerebellum subcortical white matter, posterior limbs of internal capsules, the right anterior limb of internal capsule, as well as under belly of corpus callosum. Several lesions demonstrate T1 hypointensity compatible with myelin loss. White matter lesions appear stable in comparison with the of the head. After administration of intravenous contrast there is no definite abnormal enhancement. No reduced diffusion to suggest acute or early subacute infarction. No extra-axial collection, hydrocephalus, mass effect, or herniation. Vascular: Normal flow voids. Skull and upper cervical  spine: Normal marrow signal. Sinuses/Orbits: Negative. Other: None. IMPRESSION: Stable extensive white matter hyperintensities compatible with history of multiple sclerosis. No new lesion or enhancement identified to suggest an active process. Electronically Signed   By: Kristine Garbe M.D.   On: 02/05/2018 20:01   Mr Lumbar Spine W Wo Contrast  Result Date: 02/05/2018 CLINICAL DATA:  57 y/o F; lower extremity weakness. History of multiple sclerosis. EXAM: MRI LUMBAR SPINE WITHOUT AND WITH CONTRAST TECHNIQUE: Multiplanar and multiecho pulse sequences of the lumbar spine were obtained without and with intravenous contrast. CONTRAST:  10 cc Gadavist COMPARISON:  None. FINDINGS: Segmentation:  Standard. Alignment:  Physiologic. Vertebrae: No fracture, evidence of discitis, or bone lesion. T12 and L2 stable subcentimeter T1/T2 hyperintense foci compatible with hemangiomata. No abnormal enhancement. Conus medullaris and cauda equina: Conus extends to the L1-2 level. Stable T12 cord lesion likely representing a demyelinating plaque visible on the sagittal sequences without enhancement. Normal appearance of cauda equina without enhancement. Paraspinal and other soft tissues: Negative. Disc levels: L1-2: No significant disc displacement, foraminal stenosis, or canal stenosis. L2-3: No significant disc displacement, foraminal stenosis, or canal stenosis. L3-4: Stable small disc bulge. No significant foraminal or canal stenosis. L4-5: Stable mild disc bulge eccentric to the left and facet hypertrophy. Mild left foraminal stenosis. No canal stenosis. L5-S1: Stable mild disc bulge eccentric to the left. Interval diminution of left subarticular disc protrusion. Mild facet hypertrophy. No significant foraminal or canal stenosis. IMPRESSION: 1. Stable T12 cord lesion likely representing demyelinating plaque. No abnormal enhancement. 2. Diminution of left L5-S1 subarticular disc protrusion. Otherwise stable mild  lumbar spine spondylosis. No significant foraminal  or canal stenosis. Electronically Signed   By: Kristine Garbe M.D.   On: 02/05/2018 20:07      Management plans discussed with the patient, family and they are in agreement.  CODE STATUS:     Code Status Orders  (From admission, onward)         Start     Ordered   02/02/18 1743  Full code  Continuous     02/02/18 1743       TOTAL TIME TAKING CARE OF THIS PATIENT: 40 minutes.    Henreitta Leber M.D on 02/06/2018 at 4:10 PM  Between 7am to 6pm - Pager - [phone]  After 6pm go to [email or web address] - Proofreader  [facility]  Office  [phone]  CC: Primary care physician; [name], MD

## 2018-02-06 NOTE — Care Management Important Message (Signed)
Important Message  Patient Details  Name: Rebecca [name] MRN: [id number] Date of Birth: Jul 13, 1960   Medicare Important Message Given:  Yes    Katrina Stack, RN 02/06/2018, 12:13 PM

## 2018-02-06 NOTE — Progress Notes (Signed)
Pt D/C to home via EMS. VSS. Port removed intact. Foley left in place per MD order. Education completed. All belongings sent with pt.

## 2018-02-07 NOTE — Care Management Note (Signed)
Case Management Note  Patient Details  Name: DENICE CARDON MRN: [id number] Date of Birth: 25-Aug-1960  Subjective/Objective:            Patient to discharge home and will require ems transport. Denies need for hospital bed , walker for bedside commode. Her husband assists her with transfers out of bed .  He will be in the home to receive her from ems    Action/Plan: Notified [facility] of discharge and obtained orders for RN PT OT Aide and social work  Expected Discharge Date:  02/06/18               Expected Discharge Plan:     In-House Referral:     Discharge planning Services  CM Consult  Post Acute Care Choice:  Resumption of Svcs/PTA Provider Choice offered to:  Patient  DME Arranged:    DME Agency:     HH Arranged:  RN, PT, OT, Nurse's Aide, Social Work [employer] Agency:  [facility]  Status of Service:  Completed, signed off  If discussed at [employer], dates discussed:    Additional Comments:  Katrina Stack, RN 02/07/2018, 7:46 AM

## 2018-02-08 ENCOUNTER — Other Ambulatory Visit: Payer: Self-pay | Admitting: *Deleted

## 2018-02-08 ENCOUNTER — Telehealth: Payer: Self-pay | Admitting: Diagnostic Neuroimaging

## 2018-02-08 ENCOUNTER — Telehealth: Payer: Self-pay | Admitting: Family Medicine

## 2018-02-08 ENCOUNTER — Telehealth: Payer: Self-pay

## 2018-02-08 MED ORDER — TIZANIDINE HCL 4 MG PO TABS: ORAL_TABLET | ORAL | 0 refills | Status: DC

## 2018-02-08 NOTE — Telephone Encounter (Signed)
Patient called and requested to speak with the nurse regarding a recent hospital visit. She was just released on Tuesday and would like to update Dr. Leta Baptist on some changes in her health. Please call and advise.   She wanted an earlier apt, I offered her 2 January apt's but she did not like the times so she declined, patient was added to wait list.

## 2018-02-08 NOTE — Telephone Encounter (Signed)
I have not heard from the home health nurse, 5:49 pm  I called the patient personally  She had poop all over the back of her she noted; she went to use the potty, there was poop in the back of her brief, all dried, "there is poop everywhere" and she did not feel it Not sure if happened today; she just did not feel it; she defecated in the port-a-potty; she could feel it coming and needed to go, could feel the urge; she thinks what was in there was dried and was maybe left from when she was in the hospital; they had to give her a suppository in the hospital; she can feel it and can tell when she needs to go; they brought her a bed pan and cleaned her up, new brief on then before discharge; she had some gas but did not think there was any more stool; husband did not notice any sores in the skin; she is a little sore at the gluteal cleft, right at the top of the crack  She says that some nurse is coming out tomorrow Encouraged patient to have home health do a check of skin area in the diaper area and call if any ulcers or wound care to address She does not think she is in immediate danger, does not need to return to the hospitals she thinks

## 2018-02-08 NOTE — Telephone Encounter (Signed)
Attempted to reach patient for TCM call. Left message to call back for hospital follow up and confirm appt.   Pt to call [phone] or the office.

## 2018-02-08 NOTE — Telephone Encounter (Signed)
Home health nurse returned my call; she apologized for the delay; they will be seeing her tomorrow I explained that I had just gotten off of the phone with the patient, no urgent needs tonight; they will see her tomorrow

## 2018-02-08 NOTE — Telephone Encounter (Signed)
Left message with in patient social worker [phone]) to call back ASAP regarding home health order to [facility].   Next option is to contact [facility] regarding status of order: call [phone] (open 24 hours)  If a new order is needed, it will have to come through Dr. Sanda Klein as patient is now discharged.   Ruben Reason, PharmD Clinical Pharmacist [facility] [phone]

## 2018-02-08 NOTE — Telephone Encounter (Signed)
Noted. -VRP 

## 2018-02-08 NOTE — Telephone Encounter (Signed)
Copied from [place] [id number]. Topic: General - Other >> Feb 08, 2018  1:30 PM Windy Kalata wrote: Reason for CRM: Irma patients aunt called and states that the patient is needing home health care today, but is not scheduled to get care until next week, aunt advises that the patient is not able to walk. Please advise  Best call back is [phone]

## 2018-02-08 NOTE — Telephone Encounter (Signed)
Returned patient's call. She stated she wanted Dr Leta Baptist to be aware of her recent hospitalization for swelling of legs and UTI. She is now scheduled to see a urologist due to frequent UTIs. She stated that "they keep coming back even after I've been on medication." She also had  MRI of brain and lumbar spine which were stable scans.  She asked that Dr Leta Baptist be informed. This RN advised her will let him know, and he can review all notes, scans, labs in her EMR. She verbalized understanding, appreciation of call back

## 2018-02-08 NOTE — Telephone Encounter (Signed)
Patient was just in the hospital for this; contact hospital social worker right away If unable to get help that way, have our CCM team help

## 2018-02-08 NOTE — Telephone Encounter (Signed)
Called hospital, social worker I was transferred to is out of the office until 02/12/2018, her voicemail gave me her co workers number who did not answer and I was not able to leave a VM.

## 2018-02-08 NOTE — Telephone Encounter (Signed)
I called [facility] personally, as it appears that this did not get resolved today during work hours She will contact the nurse on-call since it is after 5 pm

## 2018-02-09 ENCOUNTER — Telehealth: Payer: Self-pay | Admitting: Family Medicine

## 2018-02-09 ENCOUNTER — Telehealth: Payer: Self-pay

## 2018-02-09 DIAGNOSIS — I890 Lymphedema, not elsewhere classified: Secondary | ICD-10-CM | POA: Diagnosis not present

## 2018-02-09 DIAGNOSIS — M542 Cervicalgia: Secondary | ICD-10-CM | POA: Diagnosis not present

## 2018-02-09 DIAGNOSIS — G40909 Epilepsy, unspecified, not intractable, without status epilepticus: Secondary | ICD-10-CM | POA: Diagnosis not present

## 2018-02-09 DIAGNOSIS — G35 Multiple sclerosis: Secondary | ICD-10-CM | POA: Diagnosis not present

## 2018-02-09 DIAGNOSIS — N319 Neuromuscular dysfunction of bladder, unspecified: Secondary | ICD-10-CM | POA: Diagnosis not present

## 2018-02-09 DIAGNOSIS — K592 Neurogenic bowel, not elsewhere classified: Secondary | ICD-10-CM | POA: Diagnosis not present

## 2018-02-09 NOTE — Telephone Encounter (Signed)
I have made the 1st attempt to contact the patient or family member in charge, in order to follow up from recently being discharged from the hospital. She answered but said that right now isn't a good time to talk and requested a call back later

## 2018-02-09 NOTE — Telephone Encounter (Signed)
Transition Care Management Follow-up Telephone Call  Date of discharge and from where: 02/06/18 [facility]  How have you been since you were released from the hospital? Pt doing some better, swelling has decreased.   Any questions or concerns? Yes  - pt has concerns regarding insurance and medicare/medicaid coverage at [facility] and physical therapy due to pt being in observation for 3 days and admitted for 1 day. Advised pt to contact billing at [facility].  Items Reviewed:  Did the pt receive and understand the discharge instructions provided? Yes   Medications obtained and verified? Yes   Any new allergies since your discharge? No   Dietary orders reviewed? Yes  Do you have support at home? Yes   Functional Questionnaire: (I = Independent and D = Dependent) ADLs: D  Bathing/Dressing- D - assistance from [employer] for personal care services  Meal Prep- D  Eating- I  Maintaining continence- D - wears pads/depends  Transferring/Ambulation- I - mild assistance, uses walker and has someone behind her  Managing Meds- I  Follow up appointments reviewed:   PCP Hospital f/u appt confirmed? Yes  Scheduled to see Dr. Sanda Klein on 02/15/18 @ 11:20.  [facility] f/u appt confirmed? Yes  Scheduled to see Zara Council on 02/15/18 @ 2:00.  Are transportation arrangements needed? No   If their condition worsens, is the pt aware to call PCP or go to the Emergency Dept.? Yes  Was the patient provided with contact information for the PCP's office or ED? Yes  Was to pt encouraged to call back with questions or concerns? Yes  Pt states [facility] home health has come out for physical therapy and nursing will be there today.

## 2018-02-09 NOTE — Telephone Encounter (Signed)
Verbal okay given by Dr. Sanda Klein, [place] with [facility] notified of approved verbal order.

## 2018-02-09 NOTE — Telephone Encounter (Signed)
Copied from [place] [id number]. Topic: Quick Communication - Home Health Verbal Orders >> Feb 09, 2018 11:22 AM Rutherford Nail, NT wrote: Caller/Agency: Antonio with [facility] [place] Number: [phone] (can leave a voice mail) Requesting OT/PT/Skilled Nursing/Social Work: Re evaluate for PT Frequency:  2x a week for 3 weeks 1x a week for 1 week

## 2018-02-12 DIAGNOSIS — M542 Cervicalgia: Secondary | ICD-10-CM | POA: Diagnosis not present

## 2018-02-12 DIAGNOSIS — G40909 Epilepsy, unspecified, not intractable, without status epilepticus: Secondary | ICD-10-CM | POA: Diagnosis not present

## 2018-02-12 DIAGNOSIS — G35 Multiple sclerosis: Secondary | ICD-10-CM | POA: Diagnosis not present

## 2018-02-12 DIAGNOSIS — K592 Neurogenic bowel, not elsewhere classified: Secondary | ICD-10-CM | POA: Diagnosis not present

## 2018-02-12 DIAGNOSIS — I890 Lymphedema, not elsewhere classified: Secondary | ICD-10-CM | POA: Diagnosis not present

## 2018-02-12 DIAGNOSIS — N319 Neuromuscular dysfunction of bladder, unspecified: Secondary | ICD-10-CM | POA: Diagnosis not present

## 2018-02-13 DIAGNOSIS — K592 Neurogenic bowel, not elsewhere classified: Secondary | ICD-10-CM | POA: Diagnosis not present

## 2018-02-13 DIAGNOSIS — M542 Cervicalgia: Secondary | ICD-10-CM | POA: Diagnosis not present

## 2018-02-13 DIAGNOSIS — G35 Multiple sclerosis: Secondary | ICD-10-CM | POA: Diagnosis not present

## 2018-02-13 DIAGNOSIS — N319 Neuromuscular dysfunction of bladder, unspecified: Secondary | ICD-10-CM | POA: Diagnosis not present

## 2018-02-13 DIAGNOSIS — G40909 Epilepsy, unspecified, not intractable, without status epilepticus: Secondary | ICD-10-CM | POA: Diagnosis not present

## 2018-02-13 DIAGNOSIS — I890 Lymphedema, not elsewhere classified: Secondary | ICD-10-CM | POA: Diagnosis not present

## 2018-02-14 ENCOUNTER — Telehealth: Payer: Self-pay | Admitting: Urology

## 2018-02-14 DIAGNOSIS — I890 Lymphedema, not elsewhere classified: Secondary | ICD-10-CM | POA: Diagnosis not present

## 2018-02-14 DIAGNOSIS — M542 Cervicalgia: Secondary | ICD-10-CM | POA: Diagnosis not present

## 2018-02-14 DIAGNOSIS — G35 Multiple sclerosis: Secondary | ICD-10-CM | POA: Diagnosis not present

## 2018-02-14 DIAGNOSIS — G40909 Epilepsy, unspecified, not intractable, without status epilepticus: Secondary | ICD-10-CM | POA: Diagnosis not present

## 2018-02-14 DIAGNOSIS — N319 Neuromuscular dysfunction of bladder, unspecified: Secondary | ICD-10-CM | POA: Diagnosis not present

## 2018-02-14 DIAGNOSIS — K592 Neurogenic bowel, not elsewhere classified: Secondary | ICD-10-CM | POA: Diagnosis not present

## 2018-02-14 NOTE — Telephone Encounter (Signed)
Ericka from [facility] called and would like a call back regarding this patient's catheter. She didn't give any details as to what she needed. She can be reached at [phone]   Thanks, Sharyn Lull

## 2018-02-15 ENCOUNTER — Inpatient Hospital Stay: Payer: Medicare Other | Admitting: Family Medicine

## 2018-02-15 ENCOUNTER — Telehealth: Payer: Self-pay | Admitting: Diagnostic Neuroimaging

## 2018-02-15 ENCOUNTER — Ambulatory Visit: Payer: Medicare Other | Admitting: Urology

## 2018-02-15 DIAGNOSIS — K592 Neurogenic bowel, not elsewhere classified: Secondary | ICD-10-CM | POA: Diagnosis not present

## 2018-02-15 DIAGNOSIS — G40909 Epilepsy, unspecified, not intractable, without status epilepticus: Secondary | ICD-10-CM | POA: Diagnosis not present

## 2018-02-15 DIAGNOSIS — G35 Multiple sclerosis: Secondary | ICD-10-CM | POA: Diagnosis not present

## 2018-02-15 DIAGNOSIS — M542 Cervicalgia: Secondary | ICD-10-CM | POA: Diagnosis not present

## 2018-02-15 DIAGNOSIS — I890 Lymphedema, not elsewhere classified: Secondary | ICD-10-CM | POA: Diagnosis not present

## 2018-02-15 DIAGNOSIS — N319 Neuromuscular dysfunction of bladder, unspecified: Secondary | ICD-10-CM | POA: Diagnosis not present

## 2018-02-15 NOTE — Telephone Encounter (Signed)
Spoke with Jacob Moores and patient had her cath placed in the ED and since we did not place it we can not give her an order. She will contact the patient back.   Sharyn Lull

## 2018-02-15 NOTE — Telephone Encounter (Signed)
Rebecca [name] with [facility] wants to know if she can put a 7 [place] in her, she will need a script written. Her catheter was leaking. Please call and advise. [phone].

## 2018-02-15 NOTE — Telephone Encounter (Signed)
I spoke to Rebecca [name] with [facility] and relayed that we see her for MS.  Pt had given her our name.  I told her that from what I can see in referrals, that [facility] is who she was sent to since she wanted to stay close to her home in [place].  She was given  [phone]. Appreciated our help.

## 2018-02-16 ENCOUNTER — Telehealth: Payer: Self-pay | Admitting: Family Medicine

## 2018-02-16 DIAGNOSIS — M542 Cervicalgia: Secondary | ICD-10-CM | POA: Diagnosis not present

## 2018-02-16 DIAGNOSIS — K592 Neurogenic bowel, not elsewhere classified: Secondary | ICD-10-CM | POA: Diagnosis not present

## 2018-02-16 DIAGNOSIS — R600 Localized edema: Secondary | ICD-10-CM

## 2018-02-16 DIAGNOSIS — G35 Multiple sclerosis: Secondary | ICD-10-CM | POA: Diagnosis not present

## 2018-02-16 DIAGNOSIS — G40909 Epilepsy, unspecified, not intractable, without status epilepticus: Secondary | ICD-10-CM | POA: Diagnosis not present

## 2018-02-16 DIAGNOSIS — N319 Neuromuscular dysfunction of bladder, unspecified: Secondary | ICD-10-CM | POA: Diagnosis not present

## 2018-02-16 DIAGNOSIS — I890 Lymphedema, not elsewhere classified: Secondary | ICD-10-CM | POA: Diagnosis not present

## 2018-02-16 NOTE — Telephone Encounter (Signed)
So a hospital bed isn't indicated to help a patient bathe, so I am not quite sure what he means She certainly has lower edema swelling and would benefit from an adjustable hospital bed to elevate her legs at night I can approve the hospital bed for leg edema, but not for a problem with bathing If she has problems with bathing, let's make sure she has a shower chair or other equipment to help

## 2018-02-16 NOTE — Telephone Encounter (Signed)
Copied from [place] [phone]. Topic: Appointment Scheduling - Scheduling Inquiry for Clinic >> Feb 16, 2018  9:41 AM Bea Graff, NT wrote: Reason for CRM: Pt would like to r/s her appt with the case manager on 02/20/2018. Please call pt to r/s.

## 2018-02-16 NOTE — Telephone Encounter (Signed)
Spoke with patient. Cancelled her upcoming CCM appointment. Will reschedule at a later date.   Ruben Reason, PharmD Clinical Pharmacist [facility] [phone]

## 2018-02-16 NOTE — Telephone Encounter (Signed)
Copied from [place] [phone]. Topic: General - Other >> Feb 16, 2018  3:15 PM Keene Breath wrote: Reason for CRM: Elta Guadeloupe, PTA  with [facility], called to request an order for a Hospital Bed for patient.  Elta Guadeloupe explained that the patient has difficulty being bathed and the hospital bed would be much better to manage the patient.  Please advise and call when this would be possible.  CB# [phone] (Personal line for [employer]).

## 2018-02-16 NOTE — Telephone Encounter (Signed)
Left detailed voicemail with mark to call back

## 2018-02-19 NOTE — Telephone Encounter (Signed)
Rx faxed to [facility]

## 2018-02-19 NOTE — Telephone Encounter (Signed)
[phone] 

## 2018-02-20 ENCOUNTER — Telehealth: Payer: Self-pay | Admitting: Family Medicine

## 2018-02-20 ENCOUNTER — Ambulatory Visit: Payer: Self-pay

## 2018-02-20 DIAGNOSIS — N319 Neuromuscular dysfunction of bladder, unspecified: Secondary | ICD-10-CM | POA: Diagnosis not present

## 2018-02-20 DIAGNOSIS — I890 Lymphedema, not elsewhere classified: Secondary | ICD-10-CM | POA: Diagnosis not present

## 2018-02-20 DIAGNOSIS — M542 Cervicalgia: Secondary | ICD-10-CM | POA: Diagnosis not present

## 2018-02-20 DIAGNOSIS — G40909 Epilepsy, unspecified, not intractable, without status epilepticus: Secondary | ICD-10-CM | POA: Diagnosis not present

## 2018-02-20 DIAGNOSIS — K592 Neurogenic bowel, not elsewhere classified: Secondary | ICD-10-CM | POA: Diagnosis not present

## 2018-02-20 DIAGNOSIS — G35 Multiple sclerosis: Secondary | ICD-10-CM | POA: Diagnosis not present

## 2018-02-20 NOTE — Telephone Encounter (Signed)
Copied from [place] [phone]. Topic: Quick Communication - See Telephone Encounter >> Feb 20, 2018 12:58 PM Antonieta Iba C wrote: CRM for notification. See Telephone encounter for: 02/20/18.  Danae Chen RN w/ [facility] is calling in to be advised. Danae Chen says that she is out to see the pt and see that pt has a stage 2 abrasion on her bottom. Danae Chen would like to know how should she care for this?   CB: [phone] okay to lvm if no answer.

## 2018-02-20 NOTE — Telephone Encounter (Signed)
Pressure relief for now with donut or egg crate in wheelchair or chair seat Do they have a nurse who does wound care? If so, wound assessment with RN If not, refer to wound center for this; these can get complicated quickly

## 2018-02-21 DIAGNOSIS — G35 Multiple sclerosis: Secondary | ICD-10-CM | POA: Diagnosis not present

## 2018-02-21 DIAGNOSIS — N319 Neuromuscular dysfunction of bladder, unspecified: Secondary | ICD-10-CM | POA: Diagnosis not present

## 2018-02-21 DIAGNOSIS — K592 Neurogenic bowel, not elsewhere classified: Secondary | ICD-10-CM | POA: Diagnosis not present

## 2018-02-21 DIAGNOSIS — M542 Cervicalgia: Secondary | ICD-10-CM | POA: Diagnosis not present

## 2018-02-21 DIAGNOSIS — I890 Lymphedema, not elsewhere classified: Secondary | ICD-10-CM | POA: Diagnosis not present

## 2018-02-21 DIAGNOSIS — G40909 Epilepsy, unspecified, not intractable, without status epilepticus: Secondary | ICD-10-CM | POA: Diagnosis not present

## 2018-02-21 NOTE — Telephone Encounter (Signed)
Nurse erica notified

## 2018-02-22 ENCOUNTER — Ambulatory Visit: Payer: Medicare Other | Admitting: Urology

## 2018-02-22 ENCOUNTER — Encounter: Payer: Self-pay | Admitting: Urology

## 2018-02-23 ENCOUNTER — Other Ambulatory Visit: Payer: Self-pay

## 2018-02-23 ENCOUNTER — Telehealth: Payer: Self-pay | Admitting: Pharmacist

## 2018-02-23 ENCOUNTER — Emergency Department
Admission: EM | Admit: 2018-02-23 | Discharge: 2018-02-23 | Disposition: A | Discharge status: Home / Self Care | Payer: Medicare Other | Attending: Emergency Medicine | Admitting: Emergency Medicine

## 2018-02-23 ENCOUNTER — Encounter: Payer: Self-pay | Admitting: Emergency Medicine

## 2018-02-23 ENCOUNTER — Ambulatory Visit: Payer: Self-pay

## 2018-02-23 DIAGNOSIS — G40909 Epilepsy, unspecified, not intractable, without status epilepticus: Secondary | ICD-10-CM | POA: Diagnosis not present

## 2018-02-23 DIAGNOSIS — L304 Erythema intertrigo: Secondary | ICD-10-CM | POA: Diagnosis not present

## 2018-02-23 DIAGNOSIS — G35 Multiple sclerosis: Secondary | ICD-10-CM

## 2018-02-23 DIAGNOSIS — B379 Candidiasis, unspecified: Secondary | ICD-10-CM | POA: Diagnosis not present

## 2018-02-23 DIAGNOSIS — K592 Neurogenic bowel, not elsewhere classified: Secondary | ICD-10-CM | POA: Diagnosis not present

## 2018-02-23 DIAGNOSIS — T83198A Other mechanical complication of other urinary devices and implants, initial encounter: Secondary | ICD-10-CM | POA: Diagnosis not present

## 2018-02-23 DIAGNOSIS — Z79899 Other long term (current) drug therapy: Secondary | ICD-10-CM | POA: Insufficient documentation

## 2018-02-23 DIAGNOSIS — B372 Candidiasis of skin and nail: Secondary | ICD-10-CM | POA: Diagnosis not present

## 2018-02-23 DIAGNOSIS — T83091A Other mechanical complication of indwelling urethral catheter, initial encounter: Secondary | ICD-10-CM | POA: Diagnosis not present

## 2018-02-23 DIAGNOSIS — I890 Lymphedema, not elsewhere classified: Secondary | ICD-10-CM | POA: Diagnosis not present

## 2018-02-23 DIAGNOSIS — Y828 Other medical devices associated with adverse incidents: Secondary | ICD-10-CM | POA: Diagnosis not present

## 2018-02-23 DIAGNOSIS — N319 Neuromuscular dysfunction of bladder, unspecified: Secondary | ICD-10-CM | POA: Diagnosis not present

## 2018-02-23 DIAGNOSIS — T839XXA Unspecified complication of genitourinary prosthetic device, implant and graft, initial encounter: Secondary | ICD-10-CM | POA: Diagnosis not present

## 2018-02-23 DIAGNOSIS — Z853 Personal history of malignant neoplasm of breast: Secondary | ICD-10-CM | POA: Insufficient documentation

## 2018-02-23 DIAGNOSIS — R5381 Other malaise: Secondary | ICD-10-CM | POA: Diagnosis not present

## 2018-02-23 DIAGNOSIS — M2550 Pain in unspecified joint: Secondary | ICD-10-CM | POA: Diagnosis not present

## 2018-02-23 DIAGNOSIS — Z7401 Bed confinement status: Secondary | ICD-10-CM | POA: Diagnosis not present

## 2018-02-23 DIAGNOSIS — M542 Cervicalgia: Secondary | ICD-10-CM | POA: Diagnosis not present

## 2018-02-23 MED ORDER — NYSTATIN 100000 UNIT/GM EX POWD: Freq: Four times a day (QID) | CUTANEOUS | 0 refills | Status: DC

## 2018-02-23 NOTE — Telephone Encounter (Signed)
Porsha from the chronic care team called in and scheduled an appt for this pt with Benjamine Mola for Monday afternoon. She will also call the patient back to inform her of the appt.

## 2018-02-23 NOTE — Telephone Encounter (Signed)
Pt states that the sores are more like "breakdown" of her skin and they are "oozing." Pt states she also has a rash between her legs where the cathter is and that the nurse stated she should use a powder due to yeast infections in the past. She states that the destin will not help with the sores on her bottom and would like to see what she should do. Also at the crease of her lower abdomen is a rash with a foul smell. The home health nurse uses several different wash clothes to clean the affected areas. PT would like to see if the medicated powder and something else to treat the breakdown on her bottom? Please advise.

## 2018-02-23 NOTE — Discharge Instructions (Signed)
Follow-up with your urologist on Monday as scheduled.

## 2018-02-23 NOTE — ED Provider Notes (Signed)
[facility] Emergency Department Provider Note  ____________________________________________  Time seen: Approximately 6:00 PM  I have reviewed the triage vital signs and the nursing notes.   HISTORY  Chief Complaint foley problem    HPI Rebecca [name] is a 58 y.o. female with a history of endocarditis, breast cancer, multiple sclerosis and neurogenic bladder who comes the ED with leaking of urine around her Foley catheter.  Catheter was placed around February 05, 2018.  She has an appointment with her urologist in 3 days for reassessment, denies belly pain back pain fevers chills sweats dizziness chest pain or shortness of breath.  She feels fine other than itching in her groin.      Past Medical History:  Diagnosis Date  . Aortic valve disease    Mild AS / AI - most recent Echo demonstrated tricuspid aortic valve.  . Bacterial endocarditis    History of .  Marland Kitchen Bilateral lower extremity edema    Noncardiac.  Chronic. LE Venous dopplers - negative for DVT.; Echocardiogram January 2016: Normal EF with normal wall motion and valve function. Only grade 1 diastolic dysfunction. EF 60-65%. Mild MR  . Breast cancer ([place]) 12-31-13   Right breast, 12:00, 1.5 cm, T1c,N0 invasive mammary carcinoma, triple negative. --> Rx with Chemo  . Cervical stenosis of spine   . Herpes zoster   . IBS (irritable bowel syndrome)   . Lymphedema    has legs wrapped at [facility]  . Multiple sclerosis ([place]) 2001   Walks from room to room @ home; but Wheelchair when going out.  Marland Kitchen Neuromuscular disorder ([place])    MS  . Seizures ([place])    Takes Keppra  . Syncope and collapse      Patient Active Problem List   Diagnosis Date Noted  . Ambulatory dysfunction 02/02/2018  . Abnormality of gait 11/17/2017  . 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 Bilateral lower extremity pain (primary) (bilateral) (right greater than left) 08/01/2016  . Chronic neck pain  (secondary) (bilateral) ( left greater than right) 07/28/2016  . Long term current use of opiate analgesic 07/28/2016  . Long term prescription opiate use 07/28/2016  . Opiate use 07/28/2016  . Neutropenia ([place]) 06/27/2016  . Chronic pain syndrome 06/03/2016  . Adjustment disorder with mixed anxiety and depressed mood   . Neurogenic bladder   . Neurogenic bowel   . Detrusor and sphincter dyssynergia   . Urinary incontinence   . Seizure disorder ([place])   . Slow transit constipation   . 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[place])   . Lymphedema   . Hypoalbuminemia due to protein-calorie malnutrition ([place])   . Encounter for screening for cervical cancer  11/27/2015  . Preventative health care 11/25/2015  . History of breast cancer in female 10/28/2015  . Anemia 10/28/2015  . Thrombocytopenia ([place]) 10/28/2015  . Allergic rhinitis 10/28/2015  . IBS (irritable bowel syndrome) 10/28/2015  . 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 History of right mastectomy 10/24/2014  . Medicare annual wellness visit, subsequent 10/24/2014  . MS (multiple sclerosis) ([place]) 09/16/2014  . Hematoma complicating a procedure 03/06/2014  . Primary cancer of right female breast ([place]) 01/10/2014  . SOB (shortness of breath) 03/01/2013  . Bilateral lower extremity edema   . 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 ANKLE SURGERY     Left  . ANKLE SURGERY    . ANTERIOR CERVICAL DECOMP/DISCECTOMY FUSION  11/17/2011   Procedure: ANTERIOR CERVICAL DECOMPRESSION/DISCECTOMY FUSION 2 LEVELS;  Surgeon: Erline Levine, MD;  Location: [facility];  Service: Neurosurgery;  Laterality: N/A;  Cervical Five-Six Six-Seven Anterior cervical decompression/diskectomy/fusion  . BREAST BIOPSY Right 12-31-13   invasive mammary  . BREAST SURGERY Right 02/03/2014   Right simple mastectomy with sentinel node biopsy.  . CHOLECYSTECTOMY    . COLONOSCOPY  2014  . Lower extremity venous Dopplers   Feb 27, 2013   No LE DVT  . MASTECTOMY Right 2015  . Port a cath insertion Right 01/19/2010  . PORT-A-CATH REMOVAL     right  . PORT-A-CATH REMOVAL Right 09/03/2013   Procedure: REMOVAL PORT-A-CATH;  Surgeon: Conrad [place], MD;  Location: [facility];  Service: Vascular;  Laterality: Right;  . TRANSTHORACIC ECHOCARDIOGRAM  03/2013; 02/2014   a) Normal LV size and function with EF 60-65%.; Cannot exclude bicuspid aortic valve with mild AS and mild AI.; b) Normal EF with normal wall motion and valve function x Mild MR. G2 DD. EF 60-65%. Tricuspid AoV  . UPPER GI ENDOSCOPY  2014     Prior to Admission medications   Medication Sig Start Date End Date Taking? Authorizing Provider  amantadine (SYMMETREL) 100 MG capsule Take 1 capsule (100 mg total) by mouth 3 (three) times daily. 08/14/17   Penumalli, Earlean Polka, MD  baclofen (LIORESAL) 10 MG tablet TAKE 1 TO 2 TABLETS BY MOUTH 3 TIMES A DAY Patient taking differently: Take 10-20 mg by mouth See admin instructions. Take 2 tablets (20MG ) by mouth every morning, 1 tablet (10MG ) by mouth every day at lunchtime and 2 tablets (20MG ) by mouth every evening 08/08/17   Jamse Arn, MD  diclofenac sodium (VOLTAREN) 1 % GEL Apply 2 g topically 4 (four) times daily as needed. Apply to knees Patient not taking: Reported on 02/09/2018 01/26/18   [name], MD  Disposable Gloves (ASSURANCE VINYL EXAM GLOVES) MISC Neurogenic bladder; MS; LON 99 months; for use for personal hygiene 12/26/17   [name], MD  furosemide (LASIX) 20 MG tablet Take 1 tablet (20 mg total) by mouth daily for 7 days. 02/02/18 02/09/18  Earleen Newport, MD  glucosamine-chondroitin 500-400 MG tablet Take 1 tablet by mouth 4 (four) times daily.    [provider]  ibuprofen (ADVIL,MOTRIN) 600 MG tablet Take 1 tablet (600 mg total) by mouth every 8 (eight) hours as needed. 01/23/18   [name], MD  Incontinence Supply Disposable (PREVAIL WET WIPES) MISC Apply 1 each  topically daily as needed. (dx neurogenic bladder N31.9, LON 99 months) 12/26/17   Lada, Satira Anis, MD  Interferon Beta-1a (REBIF REBIDOSE) 44 MCG/0.5ML SOAJ Inject 0.5 mLs into the skin 3 (three) times a week. Patient taking differently: Inject 0.5 mLs into the skin every Monday, Wednesday, and Friday.  04/26/17   Penumalli, Earlean Polka, MD  levETIRAcetam (KEPPRA) 500 MG tablet Take 1 tablet (500 mg total) by mouth 2 (two) times daily. 08/14/17   Penumalli, Earlean Polka, MD  loratadine (CLARITIN) 10 MG tablet Take 10 mg by mouth daily.    [provider]  Misc Natural Products (LEG VEIN & CIRCULATION) TABS Take 1 tablet by mouth as directed.    [provider]  Multiple Vitamins-Minerals (HAIR SKIN AND NAILS FORMULA) TABS Take 1 tablet by mouth daily.     [provider]  Multiple Vitamins-Minerals (MULTIVITAMIN PO) Take 1 tablet by mouth daily.     [provider]  Multiple Vitamins-Minerals (PRESERVISION AREDS 2) CAPS Take 1 capsule by mouth 2 (two) times daily.    [provider]  nystatin (MYCOSTATIN/NYSTOP) powder Apply topically 4 (four) times daily. 02/23/18   Carrie Mew, MD  oxybutynin (DITROPAN-XL) 10 MG 24 hr tablet Take 2 tablets (20 mg total) by mouth daily. 02/13/17   Penumalli, Earlean Polka, MD  tiZANidine (ZANAFLEX) 4 MG tablet TAKE 1/2 TABLETS (2 MG TOTAL) BY MOUTH 2 (TWO) TIMES DAILY AS NEEDED (SPASTICITY UNCONTROLLED). 02/08/18   Jamse Arn, MD  Turmeric 500 MG CAPS Take 500 mg by mouth daily.    [provider]  vitamin C (ASCORBIC ACID) 500 MG tablet Take 500 mg by mouth daily.    [provider]     Allergies Fentanyl and Sulfa antibiotics   Family History  Problem Relation Age of Onset  . Cancer Father        skin  . Heart disease Father   . Heart attack Father        heart attack in his 32's  . Thyroid disease Sister   . Ovarian cancer Cousin   . Breast cancer Maternal Aunt 60  . Breast cancer Maternal  Grandmother 86  . Bladder Cancer Neg Hx   . Kidney cancer Neg Hx     Social History Social History   Tobacco Use  . Smoking status: Never Smoker  . Smokeless tobacco: Never Used  Substance Use Topics  . Alcohol use: No  . Drug use: No    Review of Systems  Constitutional:   No fever or chills.  ENT:   No sore throat. No rhinorrhea. Cardiovascular:   No chest pain or syncope. Respiratory:   No dyspnea or cough. Gastrointestinal:   Negative for abdominal pain, vomiting and diarrhea.  Musculoskeletal:   Negative for focal pain or swelling All other systems reviewed and are negative except as documented above in ROS and HPI.  ____________________________________________   PHYSICAL EXAM:  VITAL SIGNS: ED Triage Vitals  Enc Vitals Group     BP 02/23/18 1749 134/71     Pulse Rate 02/23/18 1749 68     Resp 02/23/18 1749 16     Temp 02/23/18 1749 97.6 F (36.4 C)     Temp Source 02/23/18 1749 Oral     SpO2 02/23/18 1749 97 %     Weight 02/23/18 1746 220 lb 14.4 oz (100.2 kg)     Height 02/23/18 1746 5\' 4"  (1.626 m)     Head Circumference --      Peak Flow --      Pain Score 02/23/18 1746 0     Pain Loc --      Pain Edu? --      Excl. in [place]? --     Vital signs reviewed, nursing assessments reviewed.   Constitutional:   Alert and oriented. Non-toxic appearance. Eyes:   Conjunctivae are normal. EOMI. PERRL. ENT      Head:   Normocephalic and atraumatic.      Nose:   No congestion/rhinnorhea.       Mouth/Throat:   MMM, no pharyngeal erythema. No peritonsillar mass.       Neck:   No meningismus. Full ROM. Hematological/Lymphatic/Immunilogical:   No cervical lymphadenopathy. Cardiovascular:   RRR. Symmetric bilateral radial and DP pulses.  No murmurs. Cap refill less than 2 seconds. Respiratory:   Normal respiratory effort without tachypnea/retractions. Breath sounds are clear and equal bilaterally. No wheezes/rales/rhonchi. Gastrointestinal:   Soft  and nontender. Non  distended. There is no CVA tenderness.  No rebound, rigidity, or guarding. Genitourinary:   Candidal intertrigo with satellite lesions.  Foley catheter tubing heavily colonized with sediment.  Balloon was inflated, but tubing could not be flushed. Musculoskeletal:   Normal range of motion in all extremities. No joint effusions.  No lower extremity tenderness.  No edema. Neurologic:   Normal speech and language.  No acute focal neurologic deficits are appreciated.  Skin:    Skin is warm, dry and intact. No rash noted.  No petechiae, purpura, or bullae.  ____________________________________________    LABS (pertinent positives/negatives) (all labs ordered are listed, but only abnormal results are displayed) Labs Reviewed - No data to display ____________________________________________   EKG    ____________________________________________    RADIOLOGY  No results found.  ____________________________________________   PROCEDURES Procedures  ____________________________________________    CLINICAL IMPRESSION / ASSESSMENT AND PLAN / ED COURSE  Pertinent labs & imaging results that were available during my care of the patient were reviewed by me and considered in my medical decision making (see chart for details).    Patient presents with an obstructed Foley catheter.  No concerning symptoms, vital signs are normal.  After replacing the Foley, clear urine drained.  No further testing needed at this time.  I will prescribe nystatin powder for her intertrigo.  Recommended she continue her follow-up in 3 days with urology as scheduled.      ____________________________________________   FINAL CLINICAL IMPRESSION(S) / ED DIAGNOSES    Final diagnoses:  Foley catheter problem, initial encounter ([place])  Candidal intertrigo     ED Discharge Orders         Ordered    nystatin (MYCOSTATIN/NYSTOP) powder  4 times daily,   Status:  Discontinued     02/23/18 1759     nystatin (MYCOSTATIN/NYSTOP) powder  4 times daily     02/23/18 1800          Portions of this note were generated with dragon dictation software. Dictation errors may occur despite best attempts at proofreading.   Carrie Mew, MD 02/23/18 [phone]

## 2018-02-23 NOTE — ED Triage Notes (Signed)
Here to have foley changed. Foley leaking through urethra around site. Attempted to flush foley and appears blocked. Will change out foley with new one. Pt denies pain. No fevers.

## 2018-02-23 NOTE — Chronic Care Management (AMB) (Signed)
    Chronic Care Management   Follow Up Note   02/23/2018 Name: Rebecca [name] MRN: [id number] DOB: 11/05/1960  Referred by: [name], MD Reason for referral : Chronic Care Management (appointment scheduling)    Subjective: "I really need to see Dr. Sanda Klein about this rash I have" "It has a bad smell" "The cream the home health nurse uses is making it worse"   Objective:   Assessment: Rebecca [name] is a 58 y.o. year old female who sees Lada, Satira Anis, MD for primary care. Dr. Sanda Klein asked the CCM team to consult the patient for assistance with chronic disease management and care coordination related to Rebecca, Lymphedema, Neurogenic bladder. Referral was placed today. The CCM Team met with Rebecca [name] and her husband face to face to discuss CCM services on 12/26/17. Rebecca [name] and husband agreed that CCM Team would be helpful to her. Initial face to face appointment was scheduled for the following week however Rebecca [name] had to cancel. Appointment was rescheduled for 1/14 but has also been canceled secondary to patient's decline in health/homebound status. Today CCM RN CM reached out to Rebecca [name] in an attempt to reschedule.   At initiation of conversation, Rebecca [name] immediately began to complain of a malodorous rash on her inner thighs, buttocks, and panis. She states rash is "raised with white bumps and raw". HH RN has been using "baby cream" but rash is getting worse. Rebecca [name] is fearful that rashed areas have become infected. States she has an appointment to see Dr. Sanda Klein next month however she feels an acute appointment is needed. CCM RN CM facilitated an acute appointment with Suezanne Cheshire FNP Monday 02/26/2018 at 1:20. Patient notified.  Rebecca [name] is primarily homebound. She wishes to engage with CCM at a later time. She is currently receiving HH from [facility].  Follow Up Plan: Will follow up with Rebecca. [place] next month to see if she is interested meeting with CCM Team in person or  telephonically      Jamere Stidham E. Rollene Rotunda, RN, BSN Nurse Care Coordinator [facility] Care Management  [phone]

## 2018-02-26 ENCOUNTER — Encounter: Payer: Self-pay | Admitting: Nurse Practitioner

## 2018-02-26 ENCOUNTER — Ambulatory Visit (INDEPENDENT_AMBULATORY_CARE_PROVIDER_SITE_OTHER): Payer: Medicare Other | Admitting: Nurse Practitioner

## 2018-02-26 VITALS — BP 120/70 | HR 73 | Temp 97.3°F | Resp 16 | Ht 64.0 in | Wt 217.0 lb

## 2018-02-26 DIAGNOSIS — L8991 Pressure ulcer of unspecified site, stage 1: Secondary | ICD-10-CM

## 2018-02-26 DIAGNOSIS — L304 Erythema intertrigo: Secondary | ICD-10-CM | POA: Diagnosis not present

## 2018-02-26 DIAGNOSIS — I890 Lymphedema, not elsewhere classified: Secondary | ICD-10-CM | POA: Diagnosis not present

## 2018-02-26 DIAGNOSIS — G40909 Epilepsy, unspecified, not intractable, without status epilepticus: Secondary | ICD-10-CM | POA: Diagnosis not present

## 2018-02-26 DIAGNOSIS — G35 Multiple sclerosis: Secondary | ICD-10-CM | POA: Diagnosis not present

## 2018-02-26 DIAGNOSIS — N319 Neuromuscular dysfunction of bladder, unspecified: Secondary | ICD-10-CM | POA: Diagnosis not present

## 2018-02-26 DIAGNOSIS — K592 Neurogenic bowel, not elsewhere classified: Secondary | ICD-10-CM | POA: Diagnosis not present

## 2018-02-26 DIAGNOSIS — M542 Cervicalgia: Secondary | ICD-10-CM | POA: Diagnosis not present

## 2018-02-26 MED ORDER — NYSTATIN 100000 UNIT/GM EX POWD: Freq: Three times a day (TID) | CUTANEOUS | 0 refills | Status: DC

## 2018-02-26 NOTE — Progress Notes (Signed)
Name: Rebecca [name]   MRN: [id number]    DOB: [birth year]   Date:02/26/2018       Progress Note  Subjective  Chief Complaint  Chief Complaint  Patient presents with  . Rash    HPI  Patient endorses rash under abdominal folds- was diagnosed with yeast infection and was treating with nystatin- just started on Friday- states small bottle for 4 times daily recommendation. Patient also endorses sores on her bottom- has had home health nurse that helps care for her and had noted it was oozing before but has been cleaning and dress it with gauze and is looking improved.  States the sore showed up after hospitalization- wasn't cleaned well during hospitalization.   Patient Active Problem List   Diagnosis Date Noted  . Ambulatory dysfunction 02/02/2018  . Abnormality of gait 11/17/2017  . 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 Bilateral lower extremity pain (primary) (bilateral) (right greater than left) 08/01/2016  . Chronic neck pain (secondary) (bilateral) ( left greater than right) 07/28/2016  . Long term current use of opiate analgesic 07/28/2016  . Long term prescription opiate use 07/28/2016  . Opiate use 07/28/2016  . Neutropenia ([place]) 06/27/2016  . Chronic pain syndrome 06/03/2016  . Adjustment disorder with mixed anxiety and depressed mood   . Neurogenic bladder   . Neurogenic bowel   . Detrusor and sphincter dyssynergia   . Urinary incontinence   . Seizure disorder ([place])   . Slow transit constipation   . 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[place])   . Lymphedema   . Hypoalbuminemia due to protein-calorie malnutrition ([place])   . Encounter for screening for cervical cancer  11/27/2015  . Preventative health care 11/25/2015  . History of breast cancer in female 10/28/2015  . Anemia 10/28/2015  . Thrombocytopenia ([place]) 10/28/2015  . Allergic rhinitis 10/28/2015  . IBS (irritable bowel syndrome) 10/28/2015  . 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 History of right  mastectomy 10/24/2014  . Medicare annual wellness visit, subsequent 10/24/2014  . MS (multiple sclerosis) ([place]) 09/16/2014  . Hematoma complicating a procedure 03/06/2014  . 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 SOB (shortness of breath) 03/01/2013  . Bilateral lower extremity edema   . 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 Aortic valve disease    Mild AS / AI - most recent Echo demonstrated tricuspid aortic valve.  . Bacterial endocarditis    History of .  Marland Kitchen Bilateral lower extremity edema    Noncardiac.  Chronic. LE Venous dopplers - negative for DVT.; Echocardiogram January 2016: Normal EF with normal wall motion and valve function. Only grade 1 diastolic dysfunction. EF 60-65%. Mild MR  . Breast cancer ([place]) 12-31-13   Right breast, 12:00, 1.5 cm, T1c,N0 invasive mammary carcinoma, triple negative. --> Rx with Chemo  . Cervical stenosis of spine   . Herpes zoster   . IBS (irritable bowel syndrome)   . Lymphedema    has legs wrapped at [facility]  . Multiple sclerosis ([place]) 2001   Walks from room to room @ home; but Wheelchair when going out.  Marland Kitchen Neuromuscular disorder ([place])    MS  . Seizures ([place])    Takes Keppra  . Syncope and collapse     Past Surgical History:  Procedure Laterality Date  . ANKLE SURGERY     Left  . ANKLE SURGERY    . ANTERIOR CERVICAL DECOMP/DISCECTOMY FUSION  11/17/2011  Procedure: ANTERIOR CERVICAL DECOMPRESSION/DISCECTOMY FUSION 2 LEVELS;  Surgeon: Erline Levine, MD;  Location: [place];  Service: Neurosurgery;  Laterality: N/A;  Cervical Five-Six Six-Seven Anterior cervical decompression/diskectomy/fusion  . BREAST BIOPSY Right 12-31-13   invasive mammary  . BREAST SURGERY Right 02/03/2014   Right simple mastectomy with sentinel node biopsy.  . CHOLECYSTECTOMY    . COLONOSCOPY  2014  . Lower extremity venous Dopplers  Feb 27, 2013   No LE DVT  . MASTECTOMY Right 2015    . Port a cath insertion Right 01/19/2010  . PORT-A-CATH REMOVAL     right  . PORT-A-CATH REMOVAL Right 09/03/2013   Procedure: REMOVAL PORT-A-CATH;  Surgeon: Conrad Castle Hill, MD;  Location: [facility];  Service: Vascular;  Laterality: Right;  . TRANSTHORACIC ECHOCARDIOGRAM  03/2013; 02/2014   a) Normal LV size and function with EF 60-65%.; Cannot exclude bicuspid aortic valve with mild AS and mild AI.; b) Normal EF with normal wall motion and valve function x Mild MR. G2 DD. EF 60-65%. Tricuspid AoV  . UPPER GI ENDOSCOPY  2014    Social History   Tobacco Use  . Smoking status: Never Smoker  . Smokeless tobacco: Never Used  Substance Use Topics  . Alcohol use: No     Current Outpatient Medications:  .  amantadine (SYMMETREL) 100 MG capsule, Take 1 capsule (100 mg total) by mouth 3 (three) times daily., Disp: 270 capsule, Rfl: 4 .  baclofen (LIORESAL) 10 MG tablet, TAKE 1 TO 2 TABLETS BY MOUTH 3 TIMES A DAY (Patient taking differently: Take 10-20 mg by mouth See admin instructions. Take 2 tablets (20MG ) by mouth every morning, 1 tablet (10MG ) by mouth every day at lunchtime and 2 tablets (20MG ) by mouth every evening), Disp: 180 tablet, Rfl: 4 .  diclofenac sodium (VOLTAREN) 1 % GEL, Apply 2 g topically 4 (four) times daily as needed. Apply to knees, Disp: 100 g, Rfl: 1 .  Disposable Gloves (ASSURANCE VINYL EXAM GLOVES) MISC, Neurogenic bladder; MS; LON 99 months; for use for personal hygiene, Disp: 50 each, Rfl: 11 .  glucosamine-chondroitin 500-400 MG tablet, Take 1 tablet by mouth 4 (four) times daily., Disp: , Rfl:  .  ibuprofen (ADVIL,MOTRIN) 600 MG tablet, Take 1 tablet (600 mg total) by mouth every 8 (eight) hours as needed., Disp: 180 tablet, Rfl: 0 .  Incontinence Supply Disposable (PREVAIL WET WIPES) MISC, Apply 1 each topically daily as needed. (dx neurogenic bladder N31.9, LON 99 months), Disp: 96 each, Rfl: 11 .  Interferon Beta-1a (REBIF REBIDOSE) 44 MCG/0.5ML SOAJ, Inject 0.5 mLs into  the skin 3 (three) times a week. (Patient taking differently: Inject 0.5 mLs into the skin every Monday, Wednesday, and Friday. ), Disp: 12 mL, Rfl: 11 .  levETIRAcetam (KEPPRA) 500 MG tablet, Take 1 tablet (500 mg total) by mouth 2 (two) times daily., Disp: 180 tablet, Rfl: 4 .  loratadine (CLARITIN) 10 MG tablet, Take 10 mg by mouth daily., Disp: , Rfl:  .  Misc Natural Products (LEG VEIN & CIRCULATION) TABS, Take 1 tablet by mouth as directed., Disp: , Rfl:  .  Multiple Vitamins-Minerals (HAIR SKIN AND NAILS FORMULA) TABS, Take 1 tablet by mouth daily. , Disp: , Rfl:  .  Multiple Vitamins-Minerals (MULTIVITAMIN PO), Take 1 tablet by mouth daily. , Disp: , Rfl:  .  Multiple Vitamins-Minerals (PRESERVISION AREDS 2) CAPS, Take 1 capsule by mouth 2 (two) times daily., Disp: , Rfl:  .  nystatin (MYCOSTATIN/NYSTOP) powder, Apply topically  4 (four) times daily., Disp: 15 g, Rfl: 0 .  oxybutynin (DITROPAN-XL) 10 MG 24 hr tablet, Take 2 tablets (20 mg total) by mouth daily., Disp: 180 tablet, Rfl: 3 .  tiZANidine (ZANAFLEX) 4 MG tablet, TAKE 1/2 TABLETS (2 MG TOTAL) BY MOUTH 2 (TWO) TIMES DAILY AS NEEDED (SPASTICITY UNCONTROLLED)., Disp: 90 tablet, Rfl: 0 .  Turmeric 500 MG CAPS, Take 500 mg by mouth daily., Disp: , Rfl:  .  vitamin C (ASCORBIC ACID) 500 MG tablet, Take 500 mg by mouth daily., Disp: , Rfl:  .  furosemide (LASIX) 20 MG tablet, Take 1 tablet (20 mg total) by mouth daily for 7 days., Disp: 7 tablet, Rfl: 0  Allergies  Allergen Reactions  . Fentanyl Nausea And Vomiting and Nausea Only    vomiting Was given in PACU x3 each time patient got sick vomiting Was given in PACU x3 each time patient got sick  . Sulfa Antibiotics Hives and Other (See Comments)    Light headed, over heated    ROS   No other specific complaints in a complete review of systems (except as listed in HPI above).  Objective  Vitals:   02/26/18 1403  BP: 120/70  Pulse: 73  Resp: 16  Temp: (!) 97.3 F (36.3  C)  TempSrc: Oral  SpO2: 97%  Weight: 217 lb (98.4 kg)  Height: 5\' 4"  (1.626 m)    Body mass index is 37.25 kg/m.  Nursing Note and Vital Signs reviewed.  Physical Exam Constitutional:      Appearance: Normal appearance.  HENT:     Head: Normocephalic and atraumatic.  Cardiovascular:     Rate and Rhythm: Normal rate.  Pulmonary:     Effort: Pulmonary effort is normal.  Genitourinary:    Comments: Foley catheter Musculoskeletal:     Comments: In wheelchair   Skin:      Neurological:     Mental Status: She is alert and oriented to person, place, and time.  Psychiatric:        Mood and Affect: Mood normal.        Thought Content: Thought content normal.      No results found for this or any previous visit (from the past 48 hour(s)).  Assessment & Plan  1. Intertrigo Keep clean and dry.  - nystatin (MYCOSTATIN/NYSTOP) powder; Apply topically 3 (three) times daily.  Dispense: 45 g; Refill: 0  2. Healing pressure injury, stage 1 destin cream on bottom, shift weight to prevent worsening injuries, ensure proper cleaning after using bathroom to prevent further breakdown of skin. If area becomes open again please let us know.

## 2018-02-26 NOTE — Patient Instructions (Addendum)
- Use nystatin cream 2-3 times daily in between skin folds, keep areas clean and dry. If symptoms are not improving please let us know.   -destin cream on bottom, shift weight to prevent worsening injuries, ensure proper cleaning after using bathroom to prevent further breakdown of skin. If area becomes open again please let us know.   Preventing Pressure Injuries What is a pressure injury?  A pressure injury, previously called a bedsore or a pressure ulcer, is an injury to the skin and underlying tissue caused by pressure. A pressure injury can happen when your skin presses against a surface, such as a mattress or wheelchair seat, for too long. The pressure on the blood vessels causes reduced blood flow to your skin. This can eventually cause the skin tissue to die and break down into a wound. Pressure injuries usually develop:  Over bony parts of the body, such as the tailbone, shoulders, elbows, hips, and heels.  Under medical devices, such as respiratory equipment, stockings, tubes, and splints. They can cause pain, muscle damage, and infection. How do pressure injuries happen? Pressure injuries are caused by a lack of blood supply to an area of skin. These injuries begin as a reddened area on the skin and can become an open sore. They can result from intense pressure over a short period of time or from less pressure over a long period of time. Pressure injuries can vary in severity. This condition is more likely to develop in people who:  Are in the hospital or an extended care facility.  Are bedridden or in a wheelchair.  Have an injury or disease that keeps them from: ? Moving normally. ? Feeling pain or pressure. ? Communicating if they feel pain or pressure.  Have a condition that: ? Makes them sleepy or less alert. ? Causes poor blood flow.  Need to wear a medical device.  Have poor control of their bladder or bowel functions (incontinence).  Have poor nutrition  (malnutrition).  Have had this condition before.  Are of certain ethnicities. People of African American and Latino or Hispanic descent are at higher risk compared to other ethnic groups. What actions can I take to prevent pressure injuries? Skin Care  Keep your skin clean and dry. Gently pat your skin dry.  Do not rub or massage boney areas of your skin.  Moisturize dry skin.  Use gentle cleansers and skin protectants routinely if you are incontinent.  Check your skin every day for any changes in color and for any new blisters or sores. Make sure to check under and around any medical devices and between skin folds. Have a caregiver do this for you if you are not able. Reducing and Redistributing Pressure  Do not lie or sit in one position for a long time. Move or change position every two hours, or as told by your health care provider.  Use pillows or cushions to redistribute pressure. Ask your health care provider to recommend cushions or pads for you.  Use medical devices that to not rub your skin. Tell your health care provider if one of your medical devices is causing pain or irritation. Medicines  Take over-the-counter and prescription medicines only as told by your health care provider.  If you were prescribed an antibiotic medicine, take it or apply it as told by your health care provider. Do not stop taking or using the antibiotic even if your condition improves. General Instructions   Be as active as you can every  day. Ask your health care provider to suggest safe exercises or activities.  Work with your health care provider to manage any chronic health conditions.  Eat a healthy diet that includes lots of protein. Ask your health care provider for diet advice.  Drink enough fluid to keep your urine clear or pale yellow.  Do not abuse drugs or alcohol.  Do not smoke.  Keep all follow-up visits as told by your health care provider. This is important. What steps  will be taken to prevent pressure injuries if I am in the hospital? Your health care providers:  Will inspect your skin at least daily. Skin under or around medical devices should be checked at least twice a day while you are in the hospital.  May recommend that you use certain types of bedding to help prevent them. These may include a pad, mattress, or chair cushion that is filled with gel, air, water, or foam.  Will evaluate your nutrition and consult a diet specialist (dietician), if needed.  Will inspect and change any wound dressings regularly.  May help you move into different positions every few hours.  Will adjust any medical devices and braces as needed to limit pressure on your skin.  Will keep your skin clean and dry.  May use gentle cleansers and skin protectants, if you are incontinent.  Will moisturize any dry skin. Make sure that you let your health care provider know if you feel or see any changes in your skin. This information is not intended to replace advice given to you by your health care provider. Make sure you discuss any questions you have with your health care provider. Document Released: 03/03/2004 Document Revised: 08/11/2016 Document Reviewed: 10/30/2014 Elsevier Interactive Patient Education  2019 [employer].

## 2018-02-27 ENCOUNTER — Inpatient Hospital Stay: Payer: Medicare Other

## 2018-02-27 DIAGNOSIS — K592 Neurogenic bowel, not elsewhere classified: Secondary | ICD-10-CM | POA: Diagnosis not present

## 2018-02-27 DIAGNOSIS — G35 Multiple sclerosis: Secondary | ICD-10-CM | POA: Diagnosis not present

## 2018-02-27 DIAGNOSIS — M542 Cervicalgia: Secondary | ICD-10-CM | POA: Diagnosis not present

## 2018-02-27 DIAGNOSIS — N319 Neuromuscular dysfunction of bladder, unspecified: Secondary | ICD-10-CM | POA: Diagnosis not present

## 2018-02-27 DIAGNOSIS — G40909 Epilepsy, unspecified, not intractable, without status epilepticus: Secondary | ICD-10-CM | POA: Diagnosis not present

## 2018-02-27 DIAGNOSIS — I890 Lymphedema, not elsewhere classified: Secondary | ICD-10-CM | POA: Diagnosis not present

## 2018-02-28 ENCOUNTER — Ambulatory Visit: Payer: Self-pay | Admitting: *Deleted

## 2018-02-28 ENCOUNTER — Telehealth: Payer: Self-pay | Admitting: Family Medicine

## 2018-02-28 DIAGNOSIS — G40909 Epilepsy, unspecified, not intractable, without status epilepticus: Secondary | ICD-10-CM | POA: Diagnosis not present

## 2018-02-28 DIAGNOSIS — N319 Neuromuscular dysfunction of bladder, unspecified: Secondary | ICD-10-CM | POA: Diagnosis not present

## 2018-02-28 DIAGNOSIS — G35 Multiple sclerosis: Secondary | ICD-10-CM | POA: Diagnosis not present

## 2018-02-28 DIAGNOSIS — M542 Cervicalgia: Secondary | ICD-10-CM | POA: Diagnosis not present

## 2018-02-28 DIAGNOSIS — K592 Neurogenic bowel, not elsewhere classified: Secondary | ICD-10-CM | POA: Diagnosis not present

## 2018-02-28 DIAGNOSIS — I890 Lymphedema, not elsewhere classified: Secondary | ICD-10-CM | POA: Diagnosis not present

## 2018-02-28 NOTE — Telephone Encounter (Signed)
Cindy notified.

## 2018-02-28 NOTE — Telephone Encounter (Signed)
Copied from [place] [id number]. Topic: Quick Communication - Home Health Verbal Orders >> Feb 28, 2018 12:20 PM Sheran Luz wrote: Caller/Agency: Jenny Reichmann- [facility] Number: [phone] Requesting: Two PRN visits for catheter change, if needed, in this episode.

## 2018-02-28 NOTE — Telephone Encounter (Signed)
Please please please have them document in their computer that I am not managing her catheter status at all Any and all urologic orders need to go through her urologist I am not approving urology orders

## 2018-02-28 NOTE — Telephone Encounter (Signed)
Contacted pt regarding her symptoms; she has says that the nurse who works on her foley catheter noticed these symptoms; the pt says that she started using nystatin powder on 02/23/2018 when she went to the ED to have her foley changed; the pt has a rash on her perineal area but she has not been applying powder near her vaginal area; Jenny Reichmann the home health nurse says that the pt has a brown vaginal discharge "early last week"; she says that the pt has a yeasty odor; the pt also complains of discomfort in her perineum; recommendations made per nurse triage protocol; the pt would like to be seen by Suezanne Cheshire late morning on 03/01/2018 because this provider has an appointment that is later in the morning than the pt's PCP, Dr Sanda Klein; pt offered and accepted appointment with Suezanne Cheshire, [place], 03/01/2018 at 1100; she verbalized understanding; spoke with Cassandra about scheduling 40 min appointment due to pt's mobility issues, and the type of exams which may be needed; will route to office for notification of this upcoming appointment.     Reason for Disposition . Bad smelling vaginal discharge  Answer Assessment - Initial Assessment Questions 1. DISCHARGE: "Describe the discharge." (e.g., white, yellow, green, gray, foamy, cottage cheese-like)     brown 2. ODOR: "Is there a bad odor?"     Yeasty odor 3. ONSET: "When did the discharge begin?"     02/23/2018 4. RASH: "Is there a rash in that area?" If so, ask: "Describe it." (e.g., redness, blisters, sores, bumps)     Yes; using nystatin 5. ABDOMINAL PAIN: "Are you having any abdominal pain?" If yes: "What does it feel like? " (e.g., crampy, dull, intermittent, constant)      no 6. ABDOMINAL PAIN SEVERITY: If present, ask: "How bad is it?"  (e.g., mild, moderate, severe)  - MILD - doesn't interfere with normal activities   - MODERATE - interferes with normal activities or awakens from sleep   - SEVERE - patient doesn't want to  move (R/O peritonitis)      N/a 7. CAUSE: "What do you think is causing the discharge?" "Have you had the same problem before? What happened then?"     ? UTI 8. OTHER SYMPTOMS: "Do you have any other symptoms?" (e.g., fever, itching, vaginal bleeding, pain with urination, injury to genital area, vaginal foreign body)     Dark cloudy urine with sediment;  9. PREGNANCY: "Is there any chance you are pregnant?" "When was your last menstrual period?"     No LMP 2008  Protocols used: [place]

## 2018-03-01 ENCOUNTER — Ambulatory Visit: Payer: Self-pay | Admitting: Nurse Practitioner

## 2018-03-01 ENCOUNTER — Other Ambulatory Visit: Payer: Self-pay

## 2018-03-01 ENCOUNTER — Ambulatory Visit: Payer: Self-pay

## 2018-03-01 ENCOUNTER — Telehealth: Payer: Self-pay | Admitting: Urology

## 2018-03-01 DIAGNOSIS — N319 Neuromuscular dysfunction of bladder, unspecified: Secondary | ICD-10-CM | POA: Diagnosis not present

## 2018-03-01 DIAGNOSIS — G35 Multiple sclerosis: Secondary | ICD-10-CM | POA: Diagnosis not present

## 2018-03-01 DIAGNOSIS — G40909 Epilepsy, unspecified, not intractable, without status epilepticus: Secondary | ICD-10-CM | POA: Diagnosis not present

## 2018-03-01 DIAGNOSIS — I890 Lymphedema, not elsewhere classified: Secondary | ICD-10-CM | POA: Diagnosis not present

## 2018-03-01 DIAGNOSIS — M542 Cervicalgia: Secondary | ICD-10-CM | POA: Diagnosis not present

## 2018-03-01 DIAGNOSIS — K592 Neurogenic bowel, not elsewhere classified: Secondary | ICD-10-CM | POA: Diagnosis not present

## 2018-03-01 NOTE — Telephone Encounter (Signed)
Patient's Aunt Irma calling stating that no one will help the patient and she has a yeast infection and a UTI. Benay Spice would like a call [phone].  I called and spoke to Tasia Catchings about the patient. I advised that the patient's appointment was cancelled this morning with Suezanne Cheshire, NP and an appointment is scheduled for Monday, 03/05/18 at 1140 with Dr. Ancil Boozer. She says the patient needs to be seen before Monday with the yeast and UTI that she has. She says they called the urologist and have an appointment, but they didn't do anything else. I advised there is an appointment available at [place] in the morning with [facility]. She asked if she could check with the patient first, because she has MS and it's hard for her to get up in the morning. I conference called the patient with Irma on the phone, the patient's husband got on the phone and I advised of the appointment and if that was not feasible, to take her to the ED or UC. He says that he is planning on taking her to the ED tomorrow anyway and 0820 is too early for her to get out the house. He asked the patient and the patient agrees to going to the ED.

## 2018-03-01 NOTE — Telephone Encounter (Signed)
Husband called back today with concern that pt has another UTI. Urine is dark yellow and brownish yellow sediment noted. Catheter was changed in hospital. Pt has a skin yeast infection which is being treated with Nystatin powder. Per note in chart, Dr Sanda Klein is deferring any urinary/catherter issues to urology. Husband advised to call pt's urologist.  Pt's husband would like to have pt receive more PT due to continued weakness to her legs. Pt with h/o MS. Husband canceled appointment with Suezanne Cheshire NP that was for today. Pt is not febrile, no back pain.   Reason for Disposition . Urine smells bad  Answer Assessment - Initial Assessment Questions 1. SYMPTOMS: "What symptoms are you concerned about?"     Dark yellow with sediment 2. ONSET:  "When did the symptoms start?"     yesterday 3. FEVER: "Is there a fever?" If so, ask: "What is the temperature, how was it measured, and when did it start?"     no 4. ABDOMINAL PAIN: "Is there any abdominal pain?" (e.g., Scale 1-10; or mild, moderate, severe)     no 5. URINE COLOR: "What color is the urine?"  "Is there blood present in the urine?" (e.g., clear, yellow, cloudy, tea-colored, blood streaks, bright red)     Dark yellow -brownish yellow sediment 6. ONSET: "When was the catheter inserted?"     Last Friday in hospital 7. OTHER SYMPTOMS: "Do you have any other symptoms?" (e.g., back pain, bad urine odor)   Yeast infection, bad urine odor- dark yellow in color 8. PREGNANCY: "Is there any chance you are pregnant?" "When was your last menstrual period?"     n/a  Protocols used: URINARY CATHETER SYMPTOMS AND QUESTIONS-A-AH

## 2018-03-01 NOTE — Telephone Encounter (Signed)
Pt has foley cath and states that when her [facility] Provider checked her today that it looked like she may have a yeast infection or poss UTI. She states that she does not have any fever,chills, nausea or vomiting. Pt requests a call back please.

## 2018-03-01 NOTE — Telephone Encounter (Signed)
Spoke with emergency contact Irma pt was directed to call back tomorrow for a possible opening,  urgent care or ER.  Also told to contact urology.

## 2018-03-02 ENCOUNTER — Emergency Department
Admission: EM | Admit: 2018-03-02 | Discharge: 2018-03-02 | Disposition: A | Discharge status: Home / Self Care | Payer: Medicare Other | Attending: Emergency Medicine | Admitting: Emergency Medicine

## 2018-03-02 ENCOUNTER — Other Ambulatory Visit: Payer: Self-pay

## 2018-03-02 DIAGNOSIS — Z7401 Bed confinement status: Secondary | ICD-10-CM | POA: Diagnosis not present

## 2018-03-02 DIAGNOSIS — B379 Candidiasis, unspecified: Secondary | ICD-10-CM

## 2018-03-02 DIAGNOSIS — B373 Candidiasis of vulva and vagina: Secondary | ICD-10-CM | POA: Diagnosis not present

## 2018-03-02 DIAGNOSIS — N3090 Cystitis, unspecified without hematuria: Secondary | ICD-10-CM | POA: Diagnosis not present

## 2018-03-02 DIAGNOSIS — G35 Multiple sclerosis: Secondary | ICD-10-CM | POA: Insufficient documentation

## 2018-03-02 DIAGNOSIS — N319 Neuromuscular dysfunction of bladder, unspecified: Secondary | ICD-10-CM | POA: Insufficient documentation

## 2018-03-02 DIAGNOSIS — Z79899 Other long term (current) drug therapy: Secondary | ICD-10-CM | POA: Insufficient documentation

## 2018-03-02 DIAGNOSIS — W19XXXA Unspecified fall, initial encounter: Secondary | ICD-10-CM | POA: Diagnosis not present

## 2018-03-02 DIAGNOSIS — R82998 Other abnormal findings in urine: Secondary | ICD-10-CM | POA: Diagnosis present

## 2018-03-02 DIAGNOSIS — R0902 Hypoxemia: Secondary | ICD-10-CM | POA: Diagnosis not present

## 2018-03-02 DIAGNOSIS — M2550 Pain in unspecified joint: Secondary | ICD-10-CM | POA: Diagnosis not present

## 2018-03-02 DIAGNOSIS — I959 Hypotension, unspecified: Secondary | ICD-10-CM | POA: Diagnosis not present

## 2018-03-02 DIAGNOSIS — I10 Essential (primary) hypertension: Secondary | ICD-10-CM | POA: Diagnosis not present

## 2018-03-02 LAB — URINALYSIS, COMPLETE (UACMP) WITH MICROSCOPIC
Bilirubin Urine: NEGATIVE
Glucose, UA: NEGATIVE mg/dL
Ketones, ur: NEGATIVE mg/dL
Nitrite: POSITIVE — AB
Protein, ur: 100 mg/dL — AB
Specific Gravity, Urine: 1.027 (ref 1.005–1.030)
WBC, UA: >50 WBC/hpf — ABNORMAL HIGH (ref 0–5)
pH: 6 (ref 5.0–8.0)

## 2018-03-02 MED ORDER — FLUCONAZOLE 50 MG PO TABS
150.0000 mg | ORAL_TABLET | Freq: Once | ORAL | Status: AC
  Administered 2018-03-02: 150 mg via ORAL
  Filled 2018-03-02: qty 1

## 2018-03-02 MED ORDER — CIPROFLOXACIN HCL 500 MG PO TABS: 500.0000 mg | ORAL_TABLET | Freq: Two times a day (BID) | ORAL | 0 refills | Status: AC

## 2018-03-02 MED ORDER — FLUCONAZOLE 150 MG PO TABS: 150.0000 mg | ORAL_TABLET | Freq: Once | ORAL | 1 refills | Status: AC

## 2018-03-02 MED ORDER — CIPROFLOXACIN HCL 500 MG PO TABS
500.0000 mg | ORAL_TABLET | Freq: Once | ORAL | Status: AC
  Administered 2018-03-02: 500 mg via ORAL
  Filled 2018-03-02: qty 1

## 2018-03-02 NOTE — ED Notes (Signed)
E signature pad not working 

## 2018-03-02 NOTE — ED Notes (Signed)
ED Provider at bedside. 

## 2018-03-02 NOTE — ED Provider Notes (Signed)
[facility] Emergency Department Provider Note   ____________________________________________    I have reviewed the triage vital signs and the nursing notes.   HISTORY  Chief Complaint Urinary Tract Infection     HPI Rebecca [name] is a 58 y.o. female with a history of MS with indwelling Foley, sent to the emergency department for evaluation of possible urinary tract infection and possible yeast vaginitis noticed by home health nurse.  The patient denies fevers.  She is unable to tell when she has a urinary tract infection although she does have them frequently.  Was prescribed nystatin powder for rash in the groin recently using it.   Past Medical History:  Diagnosis Date  . Aortic valve disease    Mild AS / AI - most recent Echo demonstrated tricuspid aortic valve.  . Bacterial endocarditis    History of .  Marland Kitchen Bilateral lower extremity edema    Noncardiac.  Chronic. LE Venous dopplers - negative for DVT.; Echocardiogram January 2016: Normal EF with normal wall motion and valve function. Only grade 1 diastolic dysfunction. EF 60-65%. Mild MR  . Breast cancer ([place]) 12-31-13   Right breast, 12:00, 1.5 cm, T1c,N0 invasive mammary carcinoma, triple negative. --> Rx with Chemo  . Cervical stenosis of spine   . Herpes zoster   . IBS (irritable bowel syndrome)   . Lymphedema    has legs wrapped at [facility]  . Multiple sclerosis ([place]) 2001   Walks from room to room @ home; but Wheelchair when going out.  Marland Kitchen Neuromuscular disorder ([place])    MS  . Seizures ([place])    Takes Keppra  . Syncope and collapse     Patient Active Problem List   Diagnosis Date Noted  . Ambulatory dysfunction 02/02/2018  . Abnormality of gait 11/17/2017  . 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 Bilateral lower extremity pain (primary) (bilateral) (right greater than left) 08/01/2016  . Chronic neck pain (secondary) (bilateral) ( left greater than  right) 07/28/2016  . Long term current use of opiate analgesic 07/28/2016  . Long term prescription opiate use 07/28/2016  . Opiate use 07/28/2016  . Neutropenia ([place]) 06/27/2016  . Chronic pain syndrome 06/03/2016  . Adjustment disorder with mixed anxiety and depressed mood   . Neurogenic bladder   . Neurogenic bowel   . Detrusor and sphincter dyssynergia   . Urinary incontinence   . Seizure disorder ([place])   . Slow transit constipation   . 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[place])   . Lymphedema   . Hypoalbuminemia due to protein-calorie malnutrition ([place])   . Encounter for screening for cervical cancer  11/27/2015  . Preventative health care 11/25/2015  . History of breast cancer in female 10/28/2015  . Anemia 10/28/2015  . Thrombocytopenia ([place]) 10/28/2015  . Allergic rhinitis 10/28/2015  . IBS (irritable bowel syndrome) 10/28/2015  . 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 History of right mastectomy 10/24/2014  . Medicare annual wellness visit, subsequent 10/24/2014  . MS (multiple sclerosis) ([place]) 09/16/2014  . Hematoma complicating a procedure 03/06/2014  . Primary cancer of right female breast ([place]) 01/10/2014  . SOB (shortness of breath) 03/01/2013  . Bilateral lower extremity edema   . 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 ANKLE SURGERY     Left  . ANKLE SURGERY    . ANTERIOR CERVICAL DECOMP/DISCECTOMY FUSION  11/17/2011   Procedure: ANTERIOR CERVICAL DECOMPRESSION/DISCECTOMY FUSION  2 LEVELS;  Surgeon: Erline Levine, MD;  Location: [facility];  Service: Neurosurgery;  Laterality: N/A;  Cervical Five-Six Six-Seven Anterior cervical decompression/diskectomy/fusion  . BREAST BIOPSY Right 12-31-13   invasive mammary  . BREAST SURGERY Right 02/03/2014   Right simple mastectomy with sentinel node biopsy.  . CHOLECYSTECTOMY    . COLONOSCOPY  2014  . Lower extremity venous Dopplers  Feb 27, 2013   No LE DVT  . MASTECTOMY  Right 2015  . Port a cath insertion Right 01/19/2010  . PORT-A-CATH REMOVAL     right  . PORT-A-CATH REMOVAL Right 09/03/2013   Procedure: REMOVAL PORT-A-CATH;  Surgeon: Conrad Deer Creek, MD;  Location: [facility];  Service: Vascular;  Laterality: Right;  . TRANSTHORACIC ECHOCARDIOGRAM  03/2013; 02/2014   a) Normal LV size and function with EF 60-65%.; Cannot exclude bicuspid aortic valve with mild AS and mild AI.; b) Normal EF with normal wall motion and valve function x Mild MR. G2 DD. EF 60-65%. Tricuspid AoV  . UPPER GI ENDOSCOPY  2014    Prior to Admission medications   Medication Sig Start Date End Date Taking? Authorizing Provider  amantadine (SYMMETREL) 100 MG capsule Take 1 capsule (100 mg total) by mouth 3 (three) times daily. 08/14/17   Penumalli, Earlean Polka, MD  baclofen (LIORESAL) 10 MG tablet TAKE 1 TO 2 TABLETS BY MOUTH 3 TIMES A DAY Patient taking differently: Take 10-20 mg by mouth See admin instructions. Take 2 tablets (20MG ) by mouth every morning, 1 tablet (10MG ) by mouth every day at lunchtime and 2 tablets (20MG ) by mouth every evening 08/08/17   Jamse Arn, MD  ciprofloxacin (CIPRO) 500 MG tablet Take 1 tablet (500 mg total) by mouth 2 (two) times daily for 10 days. 03/02/18 03/12/18  Lavonia Drafts, MD  diclofenac sodium (VOLTAREN) 1 % GEL Apply 2 g topically 4 (four) times daily as needed. Apply to knees 01/26/18   Lada, Satira Anis, MD  Disposable Gloves (ASSURANCE VINYL EXAM GLOVES) MISC Neurogenic bladder; MS; LON 99 months; for use for personal hygiene 12/26/17   [name], MD  fluconazole (DIFLUCAN) 150 MG tablet Take 1 tablet (150 mg total) by mouth once for 1 dose. 03/05/18 03/05/18  Lavonia Drafts, MD  furosemide (LASIX) 20 MG tablet Take 1 tablet (20 mg total) by mouth daily for 7 days. 02/02/18 02/09/18  Earleen Newport, MD  glucosamine-chondroitin 500-400 MG tablet Take 1 tablet by mouth 4 (four) times daily.    [provider]  ibuprofen (ADVIL,MOTRIN) 600  MG tablet Take 1 tablet (600 mg total) by mouth every 8 (eight) hours as needed. 01/23/18   [name], MD  Incontinence Supply Disposable (PREVAIL WET WIPES) MISC Apply 1 each topically daily as needed. (dx neurogenic bladder N31.9, LON 99 months) 12/26/17   Lada, Satira Anis, MD  Interferon Beta-1a (REBIF REBIDOSE) 44 MCG/0.5ML SOAJ Inject 0.5 mLs into the skin 3 (three) times a week. Patient taking differently: Inject 0.5 mLs into the skin every Monday, Wednesday, and Friday.  04/26/17   Penumalli, Earlean Polka, MD  levETIRAcetam (KEPPRA) 500 MG tablet Take 1 tablet (500 mg total) by mouth 2 (two) times daily. 08/14/17   Penumalli, Earlean Polka, MD  loratadine (CLARITIN) 10 MG tablet Take 10 mg by mouth daily.    [provider]  Misc Natural Products (LEG VEIN & CIRCULATION) TABS Take 1 tablet by mouth as directed.    [provider]  Multiple Vitamins-Minerals (HAIR SKIN  AND NAILS FORMULA) TABS Take 1 tablet by mouth daily.     [provider]  Multiple Vitamins-Minerals (MULTIVITAMIN PO) Take 1 tablet by mouth daily.     [provider]  Multiple Vitamins-Minerals (PRESERVISION AREDS 2) CAPS Take 1 capsule by mouth 2 (two) times daily.    [provider]  nystatin (MYCOSTATIN/NYSTOP) powder Apply topically 3 (three) times daily. 02/26/18   Poulose, Bethel Born, NP  oxybutynin (DITROPAN-XL) 10 MG 24 hr tablet Take 2 tablets (20 mg total) by mouth daily. 02/13/17   Penumalli, Earlean Polka, MD  tiZANidine (ZANAFLEX) 4 MG tablet TAKE 1/2 TABLETS (2 MG TOTAL) BY MOUTH 2 (TWO) TIMES DAILY AS NEEDED (SPASTICITY UNCONTROLLED). 02/08/18   Jamse Arn, MD  Turmeric 500 MG CAPS Take 500 mg by mouth daily.    [provider]  vitamin C (ASCORBIC ACID) 500 MG tablet Take 500 mg by mouth daily.    [provider]     Allergies Fentanyl and Sulfa antibiotics  Family History  Problem Relation Age of Onset  . Cancer Father        skin  . Heart disease  Father   . Heart attack Father        heart attack in his 28's  . Thyroid disease Sister   . Ovarian cancer Cousin   . Breast cancer Maternal Aunt 60  . Breast cancer Maternal Grandmother 19  . Bladder Cancer Neg Hx   . Kidney cancer Neg Hx     Social History Social History   Tobacco Use  . Smoking status: Never Smoker  . Smokeless tobacco: Never Used  Substance Use Topics  . Alcohol use: No  . Drug use: No    Review of Systems  Constitutional: No fever/chills Eyes: No visual changes.  ENT: No neck pain Cardiovascular: Denies chest pain. Respiratory: Denies shortness of breath. Gastrointestinal: No vomiting Genitourinary: Indwelling catheter Musculoskeletal: Negative for back pain. Skin: Groin rash Neurological: Negative for headaches    ____________________________________________   PHYSICAL EXAM:  VITAL SIGNS: ED Triage Vitals [03/02/18 0949]  Enc Vitals Group     BP (!) 151/76     Pulse Rate 70     Resp 17     Temp 97.7 F (36.5 C)     Temp Source Oral     SpO2 100 %     Weight 98.4 kg (217 lb)     Height 1.626 m (5\' 4" )     Head Circumference      Peak Flow      Pain Score 0     Pain Loc      Pain Edu?      Excl. in [place]?     Constitutional: Alert and oriented.  Eyes: Conjunctivae are normal.   Nose: No congestion/rhinnorhea. Mouth/Throat: Mucous membranes are moist.    Cardiovascular: Normal rate, regular rhythm. Grossly normal heart sounds.  Good peripheral circulation. Respiratory: Normal respiratory effort.  No retractions. Lungs CTAB. Gastrointestinal: Soft and nontender. No distention.   GU: Whitish adherent discharge vaginal introitus, Foley catheter in place, no purulent discharge.  Erythematous groin rash consistent with fungal/dermatitis.  Urine looks unremarkable Musculoskeletal:  Warm and well perfused Neurologic:  Normal speech and language. No gross focal neurologic deficits are appreciated.  Skin:  Skin is warm,  dry Psychiatric: Mood and affect are normal. Speech and behavior are normal.  ____________________________________________   LABS (all labs ordered are listed, but only abnormal results are displayed)  Labs  Reviewed  URINALYSIS, COMPLETE (UACMP) WITH MICROSCOPIC - Abnormal; Notable for the following components:      Result Value   Color, Urine YELLOW (*)    APPearance TURBID (*)    Hgb urine dipstick MODERATE (*)    Protein, ur 100 (*)    Nitrite POSITIVE (*)    Leukocytes, UA MODERATE (*)    RBC / HPF >50 (*)    WBC, UA >50 (*)    Bacteria, UA MANY (*)    All other components within normal limits  URINE CULTURE   ____________________________________________  EKG  None ____________________________________________  RADIOLOGY  None ____________________________________________   PROCEDURES  Procedure(s) performed: No  Procedures   Critical Care performed: No ____________________________________________   INITIAL IMPRESSION / ASSESSMENT AND PLAN / ED COURSE  Pertinent labs & imaging results that were available during my care of the patient were reviewed by me and considered in my medical decision making (see chart for details).  Patient well-appearing in no acute distress.  On exam suspicious for yeast vaginitis, will treat with Diflucan, urinalysis with nitrites and leukocytes, unclear if this is colonization but will cover with Cipro and send culture    ____________________________________________   FINAL CLINICAL IMPRESSION(S) / ED DIAGNOSES  Final diagnoses:  Cystitis  Yeast infection        Note:  This document was prepared using Dragon voice recognition software and may include unintentional dictation errors.   Lavonia Drafts, MD 03/02/18 [phone]

## 2018-03-02 NOTE — ED Notes (Signed)
Report to Megan, RN.

## 2018-03-02 NOTE — ED Notes (Addendum)
Arrives with foley in place, dark yellow urine with sediment present. Pt states that she has been taking nystatin X 1 week. Home care nurse visited house and wanted pt to see PCP for followup regarding UTI sx of cloudy urine and was unable to schedule an appt. Home care nurse called pt today and told her to come to ER for followup due to being unable to see PCP. Pt states that she is feeling better since starting nystatin. Pt reports no UTI x 1 week ago when she was seen here.

## 2018-03-02 NOTE — ED Notes (Signed)
Pt alert and oriented X4, active, cooperative, pt in NAD. RR even and unlabored, color WNL.  Pt informed to return if any life threatening symptoms occur.  Discharge and followup instructions reviewed.  

## 2018-03-02 NOTE — ED Triage Notes (Signed)
Pt arrived via EMS from home with c/o UTI and yeast infection. Pt was seen 2 days ago and sent home with antibiotics for UTI and yeast infection with a catheter placed. Pt reports her home health nurse told her she should be rechecked for her UTI but pt was unable to be seen by PCP until Monday. Pt is A&O x4 at this time, denies and N/V.

## 2018-03-02 NOTE — Telephone Encounter (Signed)
Called and spoke w/patient's aunt Rebecca [name] and tried to clarify what symptoms patient was having that might indicate a UTI. It was also explained that we do not typically treat yeast infections but GYN or PCP could help with this. UTI symptoms were described as odor and sediment in bag. I explained that we could have the patient come to the office this morning to learn how to irrigate and set up a follow up with a provider since she has not been seen since Feb 2019 and we were not aware that she had a foley at this time.  Then the patient was contacted and she was asked what symptoms she was having. Patient states she is having a yeast infection and discomfort, along with sediment in her bag. She also states that she has a low grade fever of 99.9. She was instructed per her PCP to go to the ER. She was offered an appointment at our office today for evaluation with a provider for the sediment issue and possible irrigation. Patient and patient's husband declined and stated that they wished to go to the ER. EMS had arrived at the patient's house and were taking her now. It was again explained that it would be best to follow up with our office rather than the ER and we could see her this morning in our [place] office or Monday in [place]. Both the patient and husband again declined. It was explained that if a urine sample was collected in the ER to not have collection from the bag but to plug the cath for collection or a new foley. Patient was also told that she should contact our office to reschedule her appointment for follow up with Rebecca [name], [facility] for her foley care.

## 2018-03-04 LAB — URINE CULTURE: Culture: >=100000 — AB

## 2018-03-05 ENCOUNTER — Ambulatory Visit: Payer: Medicare Other | Admitting: Family Medicine

## 2018-03-05 DIAGNOSIS — K592 Neurogenic bowel, not elsewhere classified: Secondary | ICD-10-CM | POA: Diagnosis not present

## 2018-03-05 DIAGNOSIS — M542 Cervicalgia: Secondary | ICD-10-CM | POA: Diagnosis not present

## 2018-03-05 DIAGNOSIS — G40909 Epilepsy, unspecified, not intractable, without status epilepticus: Secondary | ICD-10-CM | POA: Diagnosis not present

## 2018-03-05 DIAGNOSIS — G35 Multiple sclerosis: Secondary | ICD-10-CM | POA: Diagnosis not present

## 2018-03-05 DIAGNOSIS — N319 Neuromuscular dysfunction of bladder, unspecified: Secondary | ICD-10-CM | POA: Diagnosis not present

## 2018-03-05 DIAGNOSIS — I890 Lymphedema, not elsewhere classified: Secondary | ICD-10-CM | POA: Diagnosis not present

## 2018-03-06 DIAGNOSIS — M542 Cervicalgia: Secondary | ICD-10-CM | POA: Diagnosis not present

## 2018-03-06 DIAGNOSIS — N319 Neuromuscular dysfunction of bladder, unspecified: Secondary | ICD-10-CM | POA: Diagnosis not present

## 2018-03-06 DIAGNOSIS — G40909 Epilepsy, unspecified, not intractable, without status epilepticus: Secondary | ICD-10-CM | POA: Diagnosis not present

## 2018-03-06 DIAGNOSIS — G35 Multiple sclerosis: Secondary | ICD-10-CM | POA: Diagnosis not present

## 2018-03-06 DIAGNOSIS — K592 Neurogenic bowel, not elsewhere classified: Secondary | ICD-10-CM | POA: Diagnosis not present

## 2018-03-06 DIAGNOSIS — I890 Lymphedema, not elsewhere classified: Secondary | ICD-10-CM | POA: Diagnosis not present

## 2018-03-09 ENCOUNTER — Other Ambulatory Visit: Payer: Self-pay | Admitting: Nurse Practitioner

## 2018-03-09 DIAGNOSIS — G35 Multiple sclerosis: Secondary | ICD-10-CM | POA: Diagnosis not present

## 2018-03-09 DIAGNOSIS — I890 Lymphedema, not elsewhere classified: Secondary | ICD-10-CM | POA: Diagnosis not present

## 2018-03-09 DIAGNOSIS — G40909 Epilepsy, unspecified, not intractable, without status epilepticus: Secondary | ICD-10-CM | POA: Diagnosis not present

## 2018-03-09 DIAGNOSIS — M542 Cervicalgia: Secondary | ICD-10-CM | POA: Diagnosis not present

## 2018-03-09 DIAGNOSIS — N319 Neuromuscular dysfunction of bladder, unspecified: Secondary | ICD-10-CM | POA: Diagnosis not present

## 2018-03-09 DIAGNOSIS — K592 Neurogenic bowel, not elsewhere classified: Secondary | ICD-10-CM | POA: Diagnosis not present

## 2018-03-12 DIAGNOSIS — G40909 Epilepsy, unspecified, not intractable, without status epilepticus: Secondary | ICD-10-CM | POA: Diagnosis not present

## 2018-03-12 DIAGNOSIS — I890 Lymphedema, not elsewhere classified: Secondary | ICD-10-CM | POA: Diagnosis not present

## 2018-03-12 DIAGNOSIS — M542 Cervicalgia: Secondary | ICD-10-CM | POA: Diagnosis not present

## 2018-03-12 DIAGNOSIS — G35 Multiple sclerosis: Secondary | ICD-10-CM | POA: Diagnosis not present

## 2018-03-12 DIAGNOSIS — K592 Neurogenic bowel, not elsewhere classified: Secondary | ICD-10-CM | POA: Diagnosis not present

## 2018-03-12 DIAGNOSIS — N319 Neuromuscular dysfunction of bladder, unspecified: Secondary | ICD-10-CM | POA: Diagnosis not present

## 2018-03-13 ENCOUNTER — Telehealth: Payer: Self-pay | Admitting: Family Medicine

## 2018-03-13 NOTE — Telephone Encounter (Signed)
Follow up with pt regarding referral for dental help [place] Referral rejected by  by Oliver Pila on 01/08/2018 at 2:37 pm Reason for Rejection: Other Note: We have connected referral to our [facility] for treatment. She said she would contact them next week to schedule an appointment.   Pt stated that she did not receive this information on 12/2 so I gave the information for [facility] center and their hours. Ms. Picking also stated that she has [employer] and will let that agency know as well.  [place] [phone]

## 2018-03-14 ENCOUNTER — Telehealth: Payer: Self-pay | Admitting: Family Medicine

## 2018-03-14 ENCOUNTER — Telehealth: Payer: Self-pay | Admitting: *Deleted

## 2018-03-14 DIAGNOSIS — G35 Multiple sclerosis: Secondary | ICD-10-CM | POA: Diagnosis not present

## 2018-03-14 DIAGNOSIS — I890 Lymphedema, not elsewhere classified: Secondary | ICD-10-CM | POA: Diagnosis not present

## 2018-03-14 DIAGNOSIS — K592 Neurogenic bowel, not elsewhere classified: Secondary | ICD-10-CM | POA: Diagnosis not present

## 2018-03-14 DIAGNOSIS — G40909 Epilepsy, unspecified, not intractable, without status epilepticus: Secondary | ICD-10-CM | POA: Diagnosis not present

## 2018-03-14 DIAGNOSIS — N319 Neuromuscular dysfunction of bladder, unspecified: Secondary | ICD-10-CM | POA: Diagnosis not present

## 2018-03-14 DIAGNOSIS — M542 Cervicalgia: Secondary | ICD-10-CM | POA: Diagnosis not present

## 2018-03-14 NOTE — Telephone Encounter (Signed)
Adverse Event form received for time 02/02/2018.  Pt in [facility] for lymphedema and cystitis.  Form completed.  Attached with [facility] note. Sent to Dr. Leta Baptist to review.

## 2018-03-14 NOTE — Telephone Encounter (Signed)
Copied from [place] [id number]. Topic: Quick Communication - Home Health Verbal Orders >> Mar 14, 2018 11:49 AM Judyann Munson wrote: Caller/Agency: [name][facility] Callback Number: [phone] Requesting OT/PT/Skilled Nursing/Social Work: OT Frequency: 2 x 4

## 2018-03-14 NOTE — Telephone Encounter (Signed)
That is fine Please remind them that any and all orders that have to do with her catheter or urinary complaints need to be directed to her urologist

## 2018-03-14 NOTE — Telephone Encounter (Signed)
[name] with [facility] notified.

## 2018-03-16 DIAGNOSIS — G40909 Epilepsy, unspecified, not intractable, without status epilepticus: Secondary | ICD-10-CM | POA: Diagnosis not present

## 2018-03-16 DIAGNOSIS — G35 Multiple sclerosis: Secondary | ICD-10-CM | POA: Diagnosis not present

## 2018-03-16 DIAGNOSIS — K592 Neurogenic bowel, not elsewhere classified: Secondary | ICD-10-CM | POA: Diagnosis not present

## 2018-03-16 DIAGNOSIS — I890 Lymphedema, not elsewhere classified: Secondary | ICD-10-CM | POA: Diagnosis not present

## 2018-03-16 DIAGNOSIS — M542 Cervicalgia: Secondary | ICD-10-CM | POA: Diagnosis not present

## 2018-03-16 DIAGNOSIS — N319 Neuromuscular dysfunction of bladder, unspecified: Secondary | ICD-10-CM | POA: Diagnosis not present

## 2018-03-16 NOTE — Progress Notes (Deleted)
[place]  [phone]) [phone] [phone]) [phone]  ID: [name] OB: [birth year]  MR#: [id number]  BTD#:[id number]  Patient Care Team: [name], MD as PCP - General (Family Medicine) Bobetta Lime, MD as Referring Physician Byrnett, Forest Gleason, MD (General Surgery) Penni Bombard, MD as Consulting Physician (Neurology) Lloyd Huger, MD as Consulting Physician (Oncology) Leonie Man, MD as Consulting Physician (Cardiology) Minna Merritts, MD as Consulting Physician (Cardiology) Laneta Simmers as Physician Assistant (Urology) Cathi Roan, [facility] (Pharmacist) Benedetto Goad, RN as Case Manager  CHIEF COMPLAINT: 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 BRCA negative.  INTERVAL HISTORY: Patient returns to clinic today for routine six-month evaluation.  She continues to feel well and remains at her baseline.  She does not complain of pain today. Her lower extremity edema is unchanged. She has no new neurologic complaints.  She denies any chest pain or shortness of breath. She denies any nausea, vomiting, constipation, or diarrhea.  She has no urinary complaints.  Patient offers no specific complaints today.  REVIEW OF SYSTEMS:   Review of Systems  Constitutional: Negative for chills, fever and malaise/fatigue.  Respiratory: Negative for cough and shortness of breath.   Cardiovascular: Positive for leg swelling. Negative for chest pain.  Gastrointestinal: Negative for abdominal pain, constipation, diarrhea, nausea and vomiting.  Genitourinary: Negative.  Negative for dysuria, frequency and urgency.  Musculoskeletal: Positive for back pain.  Skin: Negative.  Negative for rash.  Neurological: Positive for focal weakness. Negative for sensory change, weakness and headaches.  Psychiatric/Behavioral: Negative.  The patient is not nervous/anxious.     As per HPI. Otherwise, a complete review of systems is  negative.  PAST MEDICAL HISTORY: Past Medical History:  Diagnosis Date  . Aortic valve disease    Mild AS / AI - most recent Echo demonstrated tricuspid aortic valve.  . Bacterial endocarditis    History of .  Marland Kitchen Bilateral lower extremity edema    Noncardiac.  Chronic. LE Venous dopplers - negative for DVT.; Echocardiogram January 2016: Normal EF with normal wall motion and valve function. Only grade 1 diastolic dysfunction. EF 60-65%. Mild MR  . Breast cancer ([place]) 12-31-13   Right breast, 12:00, 1.5 cm, T1c,N0 invasive mammary carcinoma, triple negative. --> Rx with Chemo  . Cervical stenosis of spine   . Herpes zoster   . IBS (irritable bowel syndrome)   . Lymphedema    has legs wrapped at [facility]  . Multiple sclerosis ([place]) 2001   Walks from room to room @ home; but Wheelchair when going out.  Marland Kitchen Neuromuscular disorder ([place])    MS  . Seizures ([place])    Takes Keppra  . Syncope and collapse     PAST SURGICAL HISTORY: Past Surgical History:  Procedure Laterality Date  . ANKLE SURGERY     Left  . ANKLE SURGERY    . ANTERIOR CERVICAL DECOMP/DISCECTOMY FUSION  11/17/2011   Procedure: ANTERIOR CERVICAL DECOMPRESSION/DISCECTOMY FUSION 2 LEVELS;  Surgeon: Erline Levine, MD;  Location: [facility];  Service: Neurosurgery;  Laterality: N/A;  Cervical Five-Six Six-Seven Anterior cervical decompression/diskectomy/fusion  . BREAST BIOPSY Right 12-31-13   invasive mammary  . BREAST SURGERY Right 02/03/2014   Right simple mastectomy with sentinel node biopsy.  . CHOLECYSTECTOMY    . COLONOSCOPY  2014  . Lower extremity venous Dopplers  Feb 27, 2013   No LE DVT  . MASTECTOMY Right 2015  . Port a  cath insertion Right 01/19/2010  . PORT-A-CATH REMOVAL     right  . PORT-A-CATH REMOVAL Right 09/03/2013   Procedure: REMOVAL PORT-A-CATH;  Surgeon: Conrad Frio, MD;  Location: [facility];  Service: Vascular;  Laterality: Right;  . TRANSTHORACIC ECHOCARDIOGRAM  03/2013; 02/2014   a) Normal LV size and  function with EF 60-65%.; Cannot exclude bicuspid aortic valve with mild AS and mild AI.; b) Normal EF with normal wall motion and valve function x Mild MR. G2 DD. EF 60-65%. Tricuspid AoV  . UPPER GI ENDOSCOPY  2014    FAMILY HISTORY Family History  Problem Relation Age of Onset  . Cancer Father        skin  . Heart disease Father   . Heart attack Father        heart attack in his 4's  . Thyroid disease Sister   . Ovarian cancer Cousin   . Breast cancer Maternal Aunt 60  . Breast cancer Maternal Grandmother 67  . Bladder Cancer Neg Hx   . Kidney cancer Neg Hx        ADVANCED DIRECTIVES:    HEALTH MAINTENANCE: Social History   Tobacco Use  . Smoking status: Never Smoker  . Smokeless tobacco: Never Used  Substance Use Topics  . Alcohol use: No  . Drug use: No     Colonoscopy:  PAP:  Bone density:  Lipid panel:  Allergies  Allergen Reactions  . Fentanyl Nausea And Vomiting and Nausea Only    vomiting Was given in PACU x3 each time patient got sick vomiting Was given in PACU x3 each time patient got sick  . Sulfa Antibiotics Hives and Other (See Comments)    Light headed, over heated    Current Outpatient Medications  Medication Sig Dispense Refill  . amantadine (SYMMETREL) 100 MG capsule Take 1 capsule (100 mg total) by mouth 3 (three) times daily. 270 capsule 4  . baclofen (LIORESAL) 10 MG tablet TAKE 1 TO 2 TABLETS BY MOUTH 3 TIMES A DAY (Patient taking differently: Take 10-20 mg by mouth See admin instructions. Take 2 tablets ('20MG'$ ) by mouth every morning, 1 tablet ('10MG'$ ) by mouth every day at lunchtime and 2 tablets ('20MG'$ ) by mouth every evening) 180 tablet 4  . diclofenac sodium (VOLTAREN) 1 % GEL Apply 2 g topically 4 (four) times daily as needed. Apply to knees 100 g 1  . Disposable Gloves (ASSURANCE VINYL EXAM GLOVES) MISC Neurogenic bladder; MS; LON 99 months; for use for personal hygiene 50 each 11  . furosemide (LASIX) 20 MG tablet Take 1 tablet (20  mg total) by mouth daily for 7 days. 7 tablet 0  . glucosamine-chondroitin 500-400 MG tablet Take 1 tablet by mouth 4 (four) times daily.    Marland Kitchen ibuprofen (ADVIL,MOTRIN) 600 MG tablet Take 1 tablet (600 mg total) by mouth every 8 (eight) hours as needed. 180 tablet 0  . Incontinence Supply Disposable (PREVAIL WET WIPES) MISC Apply 1 each topically daily as needed. (dx neurogenic bladder N31.9, LON 99 months) 96 each 11  . Interferon Beta-1a (REBIF REBIDOSE) 44 MCG/0.5ML SOAJ Inject 0.5 mLs into the skin 3 (three) times a week. (Patient taking differently: Inject 0.5 mLs into the skin every Monday, Wednesday, and Friday. ) 12 mL 11  . levETIRAcetam (KEPPRA) 500 MG tablet Take 1 tablet (500 mg total) by mouth 2 (two) times daily. 180 tablet 4  . loratadine (CLARITIN) 10 MG tablet Take 10 mg by mouth daily.    Marland Kitchen  Misc Natural Products (LEG VEIN & CIRCULATION) TABS Take 1 tablet by mouth as directed.    . Multiple Vitamins-Minerals (HAIR SKIN AND NAILS FORMULA) TABS Take 1 tablet by mouth daily.     . Multiple Vitamins-Minerals (MULTIVITAMIN PO) Take 1 tablet by mouth daily.     . Multiple Vitamins-Minerals (PRESERVISION AREDS 2) CAPS Take 1 capsule by mouth 2 (two) times daily.    Marland Kitchen nystatin (MYCOSTATIN/NYSTOP) powder Apply topically 3 (three) times daily. 45 g 0  . oxybutynin (DITROPAN-XL) 10 MG 24 hr tablet Take 2 tablets (20 mg total) by mouth daily. 180 tablet 3  . tiZANidine (ZANAFLEX) 4 MG tablet TAKE 1/2 TABLETS (2 MG TOTAL) BY MOUTH 2 (TWO) TIMES DAILY AS NEEDED (SPASTICITY UNCONTROLLED). 90 tablet 0  . Turmeric 500 MG CAPS Take 500 mg by mouth daily.    . vitamin C (ASCORBIC ACID) 500 MG tablet Take 500 mg by mouth daily.     No current facility-administered medications for this visit.     OBJECTIVE: There were no vitals filed for this visit.   There is no height or weight on file to calculate BMI.    ECOG FS:2 - Symptomatic, <50% confined to bed  General: Well-developed, well-nourished, no  acute distress.  Sitting in a wheelchair. Eyes: Pink conjunctiva, anicteric sclera. HEENT: Normocephalic, moist mucous membranes. Breast: Exam deferred today. Lungs: Clear to auscultation bilaterally. Heart: Regular rate and rhythm. No rubs, murmurs, or gallops. Abdomen: Soft, nontender, nondistended. No organomegaly noted, normoactive bowel sounds. Musculoskeletal: No edema, cyanosis, or clubbing. Neuro: Alert, answering all questions appropriately. Cranial nerves grossly intact. Skin: No rashes or petechiae noted. Psych: Normal affect.   LAB RESULTS:  Lab Results  Component Value Date   NA 137 02/04/2018   K 3.2 (L) 02/04/2018   CL 98 02/04/2018   CO2 29 02/04/2018   GLUCOSE 129 (H) 02/04/2018   BUN 17 02/04/2018   CREATININE 0.54 02/04/2018   CALCIUM 9.5 02/04/2018   PROT 7.2 02/02/2018   ALBUMIN 4.1 02/02/2018   AST 25 02/02/2018   ALT 24 02/02/2018   ALKPHOS 87 02/02/2018   BILITOT 0.8 02/02/2018   GFRNONAA >60 02/04/2018   GFRAA >60 02/04/2018    Lab Results  Component Value Date   WBC 4.6 02/04/2018   NEUTROABS 2.8 02/02/2018   HGB 12.1 02/04/2018   HCT 37.2 02/04/2018   MCV 92.8 02/04/2018   PLT 142 (L) 02/04/2018   Lab Results  Component Value Date   LABCA2 18.0 08/02/2016     STUDIES: No results found.  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 BRCA negative.  PLAN:    1. Triple negative right breast cancer: No evidence of disease. Given her difficulties with Taxol and multiple sclerosis, treatment was discontinued after a total of 6 of 12 cycles of Taxol. Her last chemotherapy was July 17, 2014.  Because patient had a full mastectomy, she did not require adjuvant XRT. Given the fact that she is a triple negative cancer, she will not require an aromatase inhibitor. Patient's most recent mammogram on August 02, 2017 was reported as BI-RADS 1.  Repeat in June 2020.  CA-27-29 also continues to be within normal limits at 21.4.   Return to clinic in 6 months for routine evaluation. 2. Multiple sclerosis: Continue evaluation and current treatment per neurology. Her primary neurologist is Dr. Andrey Spearman.  phone [phone], fax [phone]. 3. Lymphedema: Continue treatment and wraps as prescribed. 4. Peripheral neuropathy: Patient does  not complain of this today. 5. Pain: Patient is no longer taking narcotics.  Patient expressed understanding and was in agreement with this plan. She also understands that She can call clinic at any time with any questions, concerns, or complaints.   Breast cancer   Staging form: Breast, AJCC 7th Edition     Pathologic stage from 05/24/2014: Stage IA (T1c, N0, cM0) - Signed by Lloyd Huger, MD on 05/24/2014   Lloyd Huger, MD   03/16/2018 1:34 PM

## 2018-03-18 DIAGNOSIS — N319 Neuromuscular dysfunction of bladder, unspecified: Secondary | ICD-10-CM | POA: Diagnosis not present

## 2018-03-18 DIAGNOSIS — K592 Neurogenic bowel, not elsewhere classified: Secondary | ICD-10-CM | POA: Diagnosis not present

## 2018-03-18 DIAGNOSIS — R531 Weakness: Secondary | ICD-10-CM | POA: Diagnosis not present

## 2018-03-18 DIAGNOSIS — Z79891 Long term (current) use of opiate analgesic: Secondary | ICD-10-CM | POA: Diagnosis not present

## 2018-03-18 DIAGNOSIS — R262 Difficulty in walking, not elsewhere classified: Secondary | ICD-10-CM | POA: Diagnosis not present

## 2018-03-18 DIAGNOSIS — Z853 Personal history of malignant neoplasm of breast: Secondary | ICD-10-CM | POA: Diagnosis not present

## 2018-03-18 DIAGNOSIS — Z596 Low income: Secondary | ICD-10-CM | POA: Diagnosis not present

## 2018-03-18 DIAGNOSIS — G35 Multiple sclerosis: Secondary | ICD-10-CM | POA: Diagnosis not present

## 2018-03-18 DIAGNOSIS — Z9011 Acquired absence of right breast and nipple: Secondary | ICD-10-CM | POA: Diagnosis not present

## 2018-03-18 DIAGNOSIS — G8929 Other chronic pain: Secondary | ICD-10-CM | POA: Diagnosis not present

## 2018-03-18 DIAGNOSIS — L304 Erythema intertrigo: Secondary | ICD-10-CM | POA: Diagnosis not present

## 2018-03-18 DIAGNOSIS — Z960 Presence of urogenital implants: Secondary | ICD-10-CM | POA: Diagnosis not present

## 2018-03-18 DIAGNOSIS — M542 Cervicalgia: Secondary | ICD-10-CM | POA: Diagnosis not present

## 2018-03-18 DIAGNOSIS — G40909 Epilepsy, unspecified, not intractable, without status epilepticus: Secondary | ICD-10-CM | POA: Diagnosis not present

## 2018-03-18 DIAGNOSIS — I890 Lymphedema, not elsewhere classified: Secondary | ICD-10-CM | POA: Diagnosis not present

## 2018-03-19 ENCOUNTER — Telehealth: Payer: Self-pay | Admitting: *Deleted

## 2018-03-19 NOTE — Telephone Encounter (Addendum)
[employer] approved Rebif 44 mcg/0.5 mL, good until 02/07/2019. Member ID: [id number]

## 2018-03-20 ENCOUNTER — Other Ambulatory Visit: Payer: Self-pay | Admitting: Diagnostic Neuroimaging

## 2018-03-20 ENCOUNTER — Telehealth: Payer: Self-pay | Admitting: *Deleted

## 2018-03-20 ENCOUNTER — Ambulatory Visit: Payer: Medicare Other | Admitting: Urology

## 2018-03-20 DIAGNOSIS — G40909 Epilepsy, unspecified, not intractable, without status epilepticus: Secondary | ICD-10-CM | POA: Diagnosis not present

## 2018-03-20 DIAGNOSIS — I890 Lymphedema, not elsewhere classified: Secondary | ICD-10-CM | POA: Diagnosis not present

## 2018-03-20 DIAGNOSIS — K592 Neurogenic bowel, not elsewhere classified: Secondary | ICD-10-CM | POA: Diagnosis not present

## 2018-03-20 DIAGNOSIS — L304 Erythema intertrigo: Secondary | ICD-10-CM | POA: Diagnosis not present

## 2018-03-20 DIAGNOSIS — G35 Multiple sclerosis: Secondary | ICD-10-CM | POA: Diagnosis not present

## 2018-03-20 DIAGNOSIS — N319 Neuromuscular dysfunction of bladder, unspecified: Secondary | ICD-10-CM | POA: Diagnosis not present

## 2018-03-20 NOTE — Telephone Encounter (Signed)
Adverse Event form completed and reviewed per Dr. Leta Baptist,  With ED ofv note.  fax confirmation received [phone],  [phone].  (UTI and swelling both legs).

## 2018-03-21 ENCOUNTER — Telehealth: Payer: Self-pay | Admitting: *Deleted

## 2018-03-21 DIAGNOSIS — L304 Erythema intertrigo: Secondary | ICD-10-CM | POA: Diagnosis not present

## 2018-03-21 DIAGNOSIS — N319 Neuromuscular dysfunction of bladder, unspecified: Secondary | ICD-10-CM | POA: Diagnosis not present

## 2018-03-21 DIAGNOSIS — I890 Lymphedema, not elsewhere classified: Secondary | ICD-10-CM | POA: Diagnosis not present

## 2018-03-21 DIAGNOSIS — G35 Multiple sclerosis: Secondary | ICD-10-CM | POA: Diagnosis not present

## 2018-03-21 DIAGNOSIS — K592 Neurogenic bowel, not elsewhere classified: Secondary | ICD-10-CM | POA: Diagnosis not present

## 2018-03-21 DIAGNOSIS — G40909 Epilepsy, unspecified, not intractable, without status epilepticus: Secondary | ICD-10-CM | POA: Diagnosis not present

## 2018-03-21 MED ORDER — INTERFERON BETA-1A 44 MCG/0.5ML ~~LOC~~ SOAJ: 0.5000 mL | SUBCUTANEOUS | 11 refills | Status: DC

## 2018-03-21 NOTE — Telephone Encounter (Signed)
Received Rebif refill request form [facility]. Reordered with 11 refills.

## 2018-03-22 ENCOUNTER — Inpatient Hospital Stay: Payer: Medicare Other

## 2018-03-22 ENCOUNTER — Inpatient Hospital Stay: Payer: Medicare Other | Admitting: Oncology

## 2018-03-22 DIAGNOSIS — I890 Lymphedema, not elsewhere classified: Secondary | ICD-10-CM | POA: Diagnosis not present

## 2018-03-22 DIAGNOSIS — G35 Multiple sclerosis: Secondary | ICD-10-CM | POA: Diagnosis not present

## 2018-03-22 DIAGNOSIS — K592 Neurogenic bowel, not elsewhere classified: Secondary | ICD-10-CM | POA: Diagnosis not present

## 2018-03-22 DIAGNOSIS — N319 Neuromuscular dysfunction of bladder, unspecified: Secondary | ICD-10-CM | POA: Diagnosis not present

## 2018-03-22 DIAGNOSIS — G40909 Epilepsy, unspecified, not intractable, without status epilepticus: Secondary | ICD-10-CM | POA: Diagnosis not present

## 2018-03-22 DIAGNOSIS — L304 Erythema intertrigo: Secondary | ICD-10-CM | POA: Diagnosis not present

## 2018-03-26 ENCOUNTER — Emergency Department
Admission: EM | Admit: 2018-03-26 | Discharge: 2018-03-26 | Disposition: A | Discharge status: Home / Self Care | Payer: Medicare Other | Attending: Emergency Medicine | Admitting: Emergency Medicine

## 2018-03-26 ENCOUNTER — Encounter: Payer: Self-pay | Admitting: Emergency Medicine

## 2018-03-26 ENCOUNTER — Other Ambulatory Visit: Payer: Self-pay

## 2018-03-26 ENCOUNTER — Telehealth: Payer: Self-pay | Admitting: Urology

## 2018-03-26 DIAGNOSIS — I890 Lymphedema, not elsewhere classified: Secondary | ICD-10-CM | POA: Diagnosis not present

## 2018-03-26 DIAGNOSIS — R52 Pain, unspecified: Secondary | ICD-10-CM | POA: Diagnosis not present

## 2018-03-26 DIAGNOSIS — G35 Multiple sclerosis: Secondary | ICD-10-CM | POA: Diagnosis not present

## 2018-03-26 DIAGNOSIS — Z853 Personal history of malignant neoplasm of breast: Secondary | ICD-10-CM | POA: Diagnosis not present

## 2018-03-26 DIAGNOSIS — N390 Urinary tract infection, site not specified: Secondary | ICD-10-CM | POA: Insufficient documentation

## 2018-03-26 DIAGNOSIS — K592 Neurogenic bowel, not elsewhere classified: Secondary | ICD-10-CM | POA: Diagnosis not present

## 2018-03-26 DIAGNOSIS — Z7401 Bed confinement status: Secondary | ICD-10-CM | POA: Diagnosis not present

## 2018-03-26 DIAGNOSIS — Z79899 Other long term (current) drug therapy: Secondary | ICD-10-CM | POA: Diagnosis not present

## 2018-03-26 DIAGNOSIS — Y69 Unspecified misadventure during surgical and medical care: Secondary | ICD-10-CM | POA: Insufficient documentation

## 2018-03-26 DIAGNOSIS — T839XXA Unspecified complication of genitourinary prosthetic device, implant and graft, initial encounter: Secondary | ICD-10-CM | POA: Insufficient documentation

## 2018-03-26 DIAGNOSIS — M2550 Pain in unspecified joint: Secondary | ICD-10-CM | POA: Diagnosis not present

## 2018-03-26 DIAGNOSIS — N319 Neuromuscular dysfunction of bladder, unspecified: Secondary | ICD-10-CM | POA: Diagnosis not present

## 2018-03-26 DIAGNOSIS — T83098A Other mechanical complication of other indwelling urethral catheter, initial encounter: Secondary | ICD-10-CM | POA: Diagnosis not present

## 2018-03-26 DIAGNOSIS — Z9221 Personal history of antineoplastic chemotherapy: Secondary | ICD-10-CM | POA: Diagnosis not present

## 2018-03-26 DIAGNOSIS — T83028A Displacement of other indwelling urethral catheter, initial encounter: Secondary | ICD-10-CM | POA: Diagnosis present

## 2018-03-26 DIAGNOSIS — R5381 Other malaise: Secondary | ICD-10-CM | POA: Diagnosis not present

## 2018-03-26 DIAGNOSIS — G40909 Epilepsy, unspecified, not intractable, without status epilepticus: Secondary | ICD-10-CM | POA: Diagnosis not present

## 2018-03-26 DIAGNOSIS — L304 Erythema intertrigo: Secondary | ICD-10-CM | POA: Diagnosis not present

## 2018-03-26 LAB — URINALYSIS, COMPLETE (UACMP) WITH MICROSCOPIC
Bilirubin Urine: NEGATIVE
Glucose, UA: NEGATIVE mg/dL
Ketones, ur: NEGATIVE mg/dL
Nitrite: NEGATIVE
Protein, ur: 30 mg/dL — AB
Specific Gravity, Urine: 1.009 (ref 1.005–1.030)
WBC, UA: >50 WBC/hpf — ABNORMAL HIGH (ref 0–5)
pH: 6 (ref 5.0–8.0)

## 2018-03-26 MED ORDER — FLUCONAZOLE 100 MG PO TABS: 100.0000 mg | ORAL_TABLET | Freq: Every day | ORAL | 0 refills | Status: AC

## 2018-03-26 MED ORDER — HEPARIN SOD (PORK) LOCK FLUSH 100 UNIT/ML IV SOLN
500.0000 [IU] | Freq: Once | INTRAVENOUS | Status: AC
  Administered 2018-03-26: 500 [IU] via INTRAVENOUS
  Filled 2018-03-26: qty 5

## 2018-03-26 MED ORDER — FLUCONAZOLE 50 MG PO TABS
150.0000 mg | ORAL_TABLET | Freq: Once | ORAL | Status: AC
  Administered 2018-03-26: 150 mg via ORAL
  Filled 2018-03-26: qty 1

## 2018-03-26 MED ORDER — CEPHALEXIN 500 MG PO CAPS: 500.0000 mg | ORAL_CAPSULE | Freq: Three times a day (TID) | ORAL | 0 refills | Status: AC

## 2018-03-26 MED ORDER — SODIUM CHLORIDE 0.9 % IV SOLN
1.0000 g | Freq: Once | INTRAVENOUS | Status: AC
  Administered 2018-03-26: 1 g via INTRAVENOUS
  Filled 2018-03-26: qty 10

## 2018-03-26 NOTE — ED Notes (Signed)
Report was given to EMS for transportation  Back to home.

## 2018-03-26 NOTE — Telephone Encounter (Signed)
Pt called office today asking for [place] to give order for [facility] to come out and look at catheter.  Pt is pretty sure it came out when she sneezed.  I sent message to [facility] and pt hasn't been seen in our office since 2/19.  She has appt tomorrow, but has either canceled or no showed for appts since last.  Larene [place] told me to advise pt to contact her pcp or go to the ER.

## 2018-03-26 NOTE — ED Triage Notes (Addendum)
Pt sneezed and feels like foley catheter came out.  Has MS and is non ambulatory. Reports sees urology tomorrow for possible UTI; pt has seen sediment in bag. No physical symptoms such as fever or pain.

## 2018-03-26 NOTE — ED Notes (Signed)
Called [facility] for transport to [place]

## 2018-03-26 NOTE — ED Provider Notes (Addendum)
[facility] [place] Emergency Department Provider Note  ____________________________________________   I have reviewed the triage vital signs and the nursing notes. Where available I have reviewed prior notes and, if possible and indicated, outside hospital notes.    HISTORY  Chief Complaint foley catheter out    HPI Rebecca [name] is a 58 y.o. female who has an indwelling Foley, it fell out today, she came in for that reason.  While she is here, however, does want me to check and see if she has a UTI because her urine has had sediment in it recently.  She does have an appointment with her doctor tomorrow.  Urologist.  She does not have any chest pain shortness breath nausea vomiting fever or other symptoms of UTI however, she thinks that the urine might be smelling differently.  In addition, she is worried that she may be developing a yeast infection and wants me to look at down there makes everything is okay.    Past Medical History:  Diagnosis Date  . Aortic valve disease    Mild AS / AI - most recent Echo demonstrated tricuspid aortic valve.  . Bacterial endocarditis    History of .  Marland Kitchen Bilateral lower extremity edema    Noncardiac.  Chronic. LE Venous dopplers - negative for DVT.; Echocardiogram January 2016: Normal EF with normal wall motion and valve function. Only grade 1 diastolic dysfunction. EF 60-65%. Mild MR  . Breast cancer ([place]) 12-31-13   Right breast, 12:00, 1.5 cm, T1c,N0 invasive mammary carcinoma, triple negative. --> Rx with Chemo  . Cervical stenosis of spine   . Herpes zoster   . IBS (irritable bowel syndrome)   . Lymphedema    has legs wrapped at [facility]  . Multiple sclerosis ([place]) 2001   Walks from room to room @ home; but Wheelchair when going out.  Marland Kitchen Neuromuscular disorder ([place])    MS  . Seizures ([place])    Takes Keppra  . Syncope and collapse     Patient Active Problem List   Diagnosis Date Noted  . Ambulatory dysfunction 02/02/2018   . Abnormality of gait 11/17/2017  . 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 Bilateral lower extremity pain (primary) (bilateral) (right greater than left) 08/01/2016  . Chronic neck pain (secondary) (bilateral) ( left greater than right) 07/28/2016  . Long term current use of opiate analgesic 07/28/2016  . Long term prescription opiate use 07/28/2016  . Opiate use 07/28/2016  . Neutropenia ([place]) 06/27/2016  . Chronic pain syndrome 06/03/2016  . Adjustment disorder with mixed anxiety and depressed mood   . Neurogenic bladder   . Neurogenic bowel   . Detrusor and sphincter dyssynergia   . Urinary incontinence   . Seizure disorder ([place])   . Slow transit constipation   . 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[place])   . Lymphedema   . Hypoalbuminemia due to protein-calorie malnutrition ([place])   . Encounter for screening for cervical cancer  11/27/2015  . Preventative health care 11/25/2015  . History of breast cancer in female 10/28/2015  . Anemia 10/28/2015  . Thrombocytopenia ([place]) 10/28/2015  . Allergic rhinitis 10/28/2015  . IBS (irritable bowel syndrome) 10/28/2015  . 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 History of right mastectomy 10/24/2014  . Medicare annual wellness visit, subsequent 10/24/2014  . MS (multiple sclerosis) ([place]) 09/16/2014  . Hematoma complicating a procedure 03/06/2014  . Primary cancer of right female breast ([place]) 01/10/2014  . SOB (shortness of breath) 03/01/2013  .  Bilateral lower extremity edema   . 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 ANKLE SURGERY     Left  . ANKLE SURGERY    . ANTERIOR CERVICAL DECOMP/DISCECTOMY FUSION  11/17/2011   Procedure: ANTERIOR CERVICAL DECOMPRESSION/DISCECTOMY FUSION 2 LEVELS;  Surgeon: Erline Levine, MD;  Location: [facility];  Service: Neurosurgery;  Laterality: N/A;  Cervical Five-Six Six-Seven Anterior cervical decompression/diskectomy/fusion   . BREAST BIOPSY Right 12-31-13   invasive mammary  . BREAST SURGERY Right 02/03/2014   Right simple mastectomy with sentinel node biopsy.  . CHOLECYSTECTOMY    . COLONOSCOPY  2014  . Lower extremity venous Dopplers  Feb 27, 2013   No LE DVT  . MASTECTOMY Right 2015  . Port a cath insertion Right 01/19/2010  . PORT-A-CATH REMOVAL     right  . PORT-A-CATH REMOVAL Right 09/03/2013   Procedure: REMOVAL PORT-A-CATH;  Surgeon: Conrad [place], MD;  Location: [facility];  Service: Vascular;  Laterality: Right;  . TRANSTHORACIC ECHOCARDIOGRAM  03/2013; 02/2014   a) Normal LV size and function with EF 60-65%.; Cannot exclude bicuspid aortic valve with mild AS and mild AI.; b) Normal EF with normal wall motion and valve function x Mild MR. G2 DD. EF 60-65%. Tricuspid AoV  . UPPER GI ENDOSCOPY  2014    Prior to Admission medications   Medication Sig Start Date End Date Taking? Authorizing Provider  amantadine (SYMMETREL) 100 MG capsule Take 1 capsule (100 mg total) by mouth 3 (three) times daily. 08/14/17   Penumalli, Earlean Polka, MD  baclofen (LIORESAL) 10 MG tablet TAKE 1 TO 2 TABLETS BY MOUTH 3 TIMES A DAY 03/20/18   Penumalli, Earlean Polka, MD  diclofenac sodium (VOLTAREN) 1 % GEL Apply 2 g topically 4 (four) times daily as needed. Apply to knees 01/26/18   Lada, Satira Anis, MD  Disposable Gloves (ASSURANCE VINYL EXAM GLOVES) MISC Neurogenic bladder; MS; LON 99 months; for use for personal hygiene 12/26/17   Lada, Satira Anis, MD  furosemide (LASIX) 20 MG tablet Take 1 tablet (20 mg total) by mouth daily for 7 days. 02/02/18 02/09/18  Earleen Newport, MD  glucosamine-chondroitin 500-400 MG tablet Take 1 tablet by mouth 4 (four) times daily.    [provider]  ibuprofen (ADVIL,MOTRIN) 600 MG tablet Take 1 tablet (600 mg total) by mouth every 8 (eight) hours as needed. 01/23/18   [name], MD  Incontinence Supply Disposable (PREVAIL WET WIPES) MISC Apply 1 each topically daily as needed. (dx  neurogenic bladder N31.9, LON 99 months) 12/26/17   [name], MD  Interferon Beta-1a (REBIF REBIDOSE) 44 MCG/0.5ML SOAJ Inject 0.5 mLs into the skin every Monday, Wednesday, and Friday. 03/21/18   Penumalli, Earlean Polka, MD  levETIRAcetam (KEPPRA) 500 MG tablet Take 1 tablet (500 mg total) by mouth 2 (two) times daily. 08/14/17   Penumalli, Earlean Polka, MD  loratadine (CLARITIN) 10 MG tablet Take 10 mg by mouth daily.    [provider]  Misc Natural Products (LEG VEIN & CIRCULATION) TABS Take 1 tablet by mouth as directed.    [provider]  Multiple Vitamins-Minerals (HAIR SKIN AND NAILS FORMULA) TABS Take 1 tablet by mouth daily.     [provider]  Multiple Vitamins-Minerals (MULTIVITAMIN PO) Take 1 tablet by mouth daily.     [provider]  Multiple Vitamins-Minerals (PRESERVISION AREDS 2) CAPS Take 1 capsule by mouth 2 (two)  times daily.    [provider]  nystatin (MYCOSTATIN/NYSTOP) powder Apply topically 3 (three) times daily. 02/26/18   Poulose, Bethel Born, NP  oxybutynin (DITROPAN-XL) 10 MG 24 hr tablet Take 2 tablets (20 mg total) by mouth daily. 02/13/17   Penumalli, Earlean Polka, MD  tiZANidine (ZANAFLEX) 4 MG tablet TAKE 1/2 TABLETS (2 MG TOTAL) BY MOUTH 2 (TWO) TIMES DAILY AS NEEDED (SPASTICITY UNCONTROLLED). 02/08/18   Jamse Arn, MD  Turmeric 500 MG CAPS Take 500 mg by mouth daily.    [provider]  vitamin C (ASCORBIC ACID) 500 MG tablet Take 500 mg by mouth daily.    [provider]    Allergies Fentanyl and Sulfa antibiotics  Family History  Problem Relation Age of Onset  . Cancer Father        skin  . Heart disease Father   . Heart attack Father        heart attack in his 15's  . Thyroid disease Sister   . Ovarian cancer Cousin   . Breast cancer Maternal Aunt 60  . Breast cancer Maternal Grandmother 74  . Bladder Cancer Neg Hx   . Kidney cancer Neg Hx     Social History Social History   Tobacco  Use  . Smoking status: Never Smoker  . Smokeless tobacco: Never Used  Substance Use Topics  . Alcohol use: No  . Drug use: No    Review of Systems Constitutional: No fever/chills Eyes: No visual changes. ENT: No sore throat. No stiff neck no neck pain Cardiovascular: Denies chest pain. Respiratory: Denies shortness of breath. Gastrointestinal:   no vomiting.  No diarrhea.  No constipation. Genitourinary: Negative for dysuria. Musculoskeletal: Negative lower extremity swelling Skin: Negative for rash. Neurological: Negative for severe headaches, focal weakness or numbness.   ____________________________________________   PHYSICAL EXAM:  VITAL SIGNS: ED Triage Vitals  Enc Vitals Group     BP --      Pulse Rate 03/26/18 1607 82     Resp 03/26/18 1607 18     Temp 03/26/18 1607 98.4 F (36.9 C)     Temp Source 03/26/18 1607 Oral     SpO2 03/26/18 1607 96 %     Weight 03/26/18 1605 217 lb (98.4 kg)     Height 03/26/18 1605 5\' 4"  (1.626 m)     Head Circumference --      Peak Flow --      Pain Score 03/26/18 1605 0     Pain Loc --      Pain Edu? --      Excl. in [place]? --     Constitutional: Alert and oriented. Well appearing and in no acute distress. Eyes: Conjunctivae are normal Head: Atraumatic HEENT: No congestion/rhinnorhea. Mucous membranes are moist.  Oropharynx non-erythematous Neck:   Nontender with no meningismus, no masses, no stridor Cardiovascular: Normal rate, regular rhythm. Grossly normal heart sounds.  Good peripheral circulation. Respiratory: Normal respiratory effort.  No retractions. Lungs CTAB. Abdominal: Soft and nontender. No distention. No guarding no rebound Back:  There is no focal tenderness or step off.  there is no midline tenderness there are no lesions noted. there is no CVA tenderness Musculoskeletal: No lower extremity tenderness, no upper extremity tenderness. No joint effusions, no DVT signs strong distal pulses no edema Neurologic:   Normal speech and language. No gross focal neurologic deficits are appreciated.  Skin:  Skin is warm, dry and intact. No rash noted. Psychiatric: Mood and  affect are normal. Speech and behavior are normal.  ____________________________________________   LABS (all labs ordered are listed, but only abnormal results are displayed)  Labs Reviewed  URINE CULTURE  URINALYSIS, COMPLETE (UACMP) WITH MICROSCOPIC    Pertinent labs  results that were available during my care of the patient were reviewed by me and considered in my medical decision making (see chart for details). ____________________________________________  EKG  I personally interpreted any EKGs ordered by me or triage  ____________________________________________  RADIOLOGY  Pertinent labs & imaging results that were available during my care of the patient were reviewed by me and considered in my medical decision making (see chart for details). If possible, patient and/or family made aware of any abnormal findings.  No results found. ____________________________________________    PROCEDURES  Procedure(s) performed: None  Procedures  Critical Care performed: None  ____________________________________________   INITIAL IMPRESSION / ASSESSMENT AND PLAN / ED COURSE  Pertinent labs & imaging results that were available during my care of the patient were reviewed by me and considered in my medical decision making (see chart for details).  Patient here for several complaints.  First is the Foley needs to be changed, we have changed it.  The second is she is worried it might have a UTI.  We will check it.  We will follow-up with her doctor tomorrow on the culture results that she has had resistant and also possibly colonized urine in the past.  We will see if there is evidence of yeast infection offer Diflucan and we will reassess  ----------------------------------------- 5:24 PM on  03/26/2018 -----------------------------------------  Urine certainly could be consistent with UTI, no nitrates but positive leukocytes and white cells etc.  We will treat her with Rocephin as it seems to be the medication that is most universally applicable to the usual bacteria that she suffers from.  Urine culture has been sent and she will follow-up with her allergist tomorrow.  In addition, patient declines offered pelvic exam but she will take a Diflucan pill for which she feels is a yeast infection.  ----------------------------------------- 6:42 PM on 03/26/2018 ----------------------------------------- Start the patient on Keflex, and we will await urine culture for further details.  She does have a follow-up with your urologist tomorrow hopefully they can check the culture results.  Patient in no acute distress and this was a incidentally noticed UTI based on the appearance of the urine not based on her symptoms otherwise and she has not been feeling and all ill.  Nonetheless, she states she can tell when she has UTI and she certainly has had several in the past.  Patient again declines pelvic exam or any further work-up and is eager to go   ____________________________________________   FINAL CLINICAL IMPRESSION(S) / ED DIAGNOSES  Final diagnoses:  None      This chart was dictated using voice recognition software.  Despite best efforts to proofread,  errors can occur which can change meaning.      Schuyler Amor, MD 03/26/18 1641    Schuyler Amor, MD 03/26/18 1725    Schuyler Amor, MD 03/26/18 1843

## 2018-03-27 ENCOUNTER — Encounter: Payer: Self-pay | Admitting: Urology

## 2018-03-27 ENCOUNTER — Other Ambulatory Visit: Payer: Self-pay

## 2018-03-27 ENCOUNTER — Ambulatory Visit (INDEPENDENT_AMBULATORY_CARE_PROVIDER_SITE_OTHER): Payer: Medicare Other | Admitting: Urology

## 2018-03-27 VITALS — BP 137/81 | HR 67

## 2018-03-27 DIAGNOSIS — N390 Urinary tract infection, site not specified: Secondary | ICD-10-CM | POA: Diagnosis not present

## 2018-03-27 DIAGNOSIS — N950 Postmenopausal bleeding: Secondary | ICD-10-CM | POA: Diagnosis not present

## 2018-03-27 NOTE — Progress Notes (Signed)
03/27/2018 4:08 PM   Rebecca [name] 02-13-1960 [id number]  Referring provider: [name], MD [address] [place], [place] [address]  Chief Complaint  Patient presents with  . Advice Only    HPI: Rebecca [name] is a 58 y.o. female Caucasian with MS with a history of urinary retention associated with an UTI who presents today for follow up after having her indwelling Foley fell out and a possible UTI and yeast infection.  She reported to the emergency department on 03/26/2018 after her Foley catheter fell out, and she requested as well a check to see if she had a UTI due to her urine having had sediment in it recently.  On that visit, a UA was done and based on the results she was given antibiotics and urine was sent off to culture.  Patient was in no acute distress, and she reported no symptoms of a UTI other than the appearance of her urine.  Patient reports today (03/27/2018) that she has been having near constant UTIs since 10/2017.  She reports as her symptoms discloration, odor, frequency.  She had a Foley catheter placed during her 02/02/2018 hospital stay.  Patient denies any pain but noted that she has no sensation in region.  Patient denies suprapubic, lower back, and flank pain.  Patient denies dysuria, fever, chills, nausea, vomiting.  Patient says that antibiotics either are ineffective or only give her 4-5 days relief.  Patient says that she prefers the Foley catheter due to urinary frequency, stating that without the indwelling catheter she is barely able to get a few hours' sleep and that she has a problem being capable of reaching the bathroom.  Patient also reports that she may have a yeast infection, as she has had vaginal area discharge that was 'abnormally' colored.  Patient reports drinking water, 'Ice' (unsweetened flavored water), and occasionally cranberry juice.  Patient does take cranberry pills.  Patient does not take probiotics currently.  Patient  uses pads daily, but she does not use a barrier cream.  Background history Patient is a 58 year old Caucasian female with MS who went into urinary retention with an UTI referred by Dr. Sanda Klein with her husband, [name].  She states that she has been having episodes of inability to void followed by urge incontinence for the last ten years.  She describes this situation as having the feeling to need to void and then not being able to pass urine.  Then when she removes herself from the toilet, the urine spills out.   She is going through 2 to 3 urinary briefs daily.  Her baseline urinary symptoms consist of frequency (goes quit a lot), severe urgency and nocturia x 10.  She denies fevers, chills, nausea or vomiting.  She denies any gross hematuria, suprapubic pain and dysuria.  Her retention episode occurred after she suffered a fall.  She and her husband state that she usually gets weaker when she has an UTI.  On 01/17/2017, she presented to the ED and was found to have 534 mL on bladder scan.  A Foley was placed and 450 mL of malodorous, yellow cloudy urine was returned.  The UA was positive for TNTC RBC's and TNTC WBC's.  Urine culture was positive for MULTIPLE SPECIES PRESENT, SUGGEST RECOLLECTION.  She was given 1 gram of Rocephin in the ED and discharged with 10 days of Keflex.  She stated she felt better with a urinary catheter in place.  She states  that she has been on the oxybutynin for a number of years.   Reviewed referral notes.   GAY MONCIVAIS is a 58 y.o. female who comes into the hospital today with some leg numbness and urinary retention.  The patient states that she fell a couple of days ago on her bottom.  Her husband could not get her up so EMS was called and they help to pick her up off of the floor.  The patient states though that she did not come into the hospital.  She now though has developed some bilateral lower extremity weakness and urinary retention.  She denies any pain at this time.  The  patient has a history of MS and has frequent falls.  She reports that she fell backwards but did not hit her head.  She reports that she just feels more weak and can barely hold up her weight on her 2 legs.  She is also had some difficulty urinating when she sits on the toilet. The patient states that she feels like she has to go but she is unable to do so.  She is here today for evaluation of her symptoms.  At her visit on 02/15/2016, we discontinued the oxybutynin and discussed UTI prevention strategies.  2 days later, her husband contacted the office stating the patient was experiencing frequency and dysuria.  Cath UA was collected for UA and culture.  Her urine culture was positive for E. Coli.  She was treated with the appropriate antibiotic.    On her 02/25/22019 visit, her urinary frequency and nocturia had worsened, so they reinstated her oxybutynin and her urinary symptoms have improved.  The patient had been experiencing urgency x 4-7 (improved), frequency x 4-7 (patient states she is better), was restricting fluids to avoid visits to the restroom, was engaging in toilet mapping, incontinence x 4-7 (stable) and nocturia x 4-7 (stable).  Patient denied any gross hematuria, dysuria or suprapubic/flank pain.  Patient denied any fevers, chills, nausea or vomiting.   Her UA was clear.  Her PVR was 270 mL.    PMH: Past Medical History:  Diagnosis Date  . Aortic valve disease    Mild AS / AI - most recent Echo demonstrated tricuspid aortic valve.  . Bacterial endocarditis    History of .  Marland Kitchen Bilateral lower extremity edema    Noncardiac.  Chronic. LE Venous dopplers - negative for DVT.; Echocardiogram January 2016: Normal EF with normal wall motion and valve function. Only grade 1 diastolic dysfunction. EF 60-65%. Mild MR  . Breast cancer ([place]) 12-31-13   Right breast, 12:00, 1.5 cm, T1c,N0 invasive mammary carcinoma, triple negative. --> Rx with Chemo  . Cervical stenosis of spine   . Herpes  zoster   . IBS (irritable bowel syndrome)   . Lymphedema    has legs wrapped at [facility]  . Multiple sclerosis ([place]) 2001   Walks from room to room @ home; but Wheelchair when going out.  Marland Kitchen Neuromuscular disorder ([place])    MS  . Seizures ([place])    Takes Keppra  . Syncope and collapse     Surgical History: Past Surgical History:  Procedure Laterality Date  . ANKLE SURGERY     Left  . ANKLE SURGERY    . ANTERIOR CERVICAL DECOMP/DISCECTOMY FUSION  11/17/2011   Procedure: ANTERIOR CERVICAL DECOMPRESSION/DISCECTOMY FUSION 2 LEVELS;  Surgeon: Erline Levine, MD;  Location: [facility];  Service: Neurosurgery;  Laterality: N/A;  Cervical Five-Six Six-Seven Anterior cervical  decompression/diskectomy/fusion  . BREAST BIOPSY Right 12-31-13   invasive mammary  . BREAST SURGERY Right 02/03/2014   Right simple mastectomy with sentinel node biopsy.  . CHOLECYSTECTOMY    . COLONOSCOPY  2014  . Lower extremity venous Dopplers  Feb 27, 2013   No LE DVT  . MASTECTOMY Right 2015  . Port a cath insertion Right 01/19/2010  . PORT-A-CATH REMOVAL     right  . PORT-A-CATH REMOVAL Right 09/03/2013   Procedure: REMOVAL PORT-A-CATH;  Surgeon: Conrad Whiting, MD;  Location: [facility];  Service: Vascular;  Laterality: Right;  . TRANSTHORACIC ECHOCARDIOGRAM  03/2013; 02/2014   a) Normal LV size and function with EF 60-65%.; Cannot exclude bicuspid aortic valve with mild AS and mild AI.; b) Normal EF with normal wall motion and valve function x Mild MR. G2 DD. EF 60-65%. Tricuspid AoV  . UPPER GI ENDOSCOPY  2014    Home Medications:  Allergies as of 03/27/2018      Reactions   Fentanyl Nausea And Vomiting, Nausea Only   vomiting Was given in PACU x3 each time patient got sick vomiting Was given in PACU x3 each time patient got sick   Sulfa Antibiotics Hives, Other (See Comments)   Light headed, over heated      Medication List       Accurate as of March 27, 2018  4:08 PM. Always use your most recent med  list.        amantadine 100 MG capsule Commonly known as:  SYMMETREL Take 1 capsule (100 mg total) by mouth 3 (three) times daily.   ASSURANCE VINYL EXAM GLOVES Misc Neurogenic bladder; MS; LON 99 months; for use for personal hygiene   baclofen 10 MG tablet Commonly known as:  LIORESAL TAKE 1 TO 2 TABLETS BY MOUTH 3 TIMES A DAY   cephALEXin 500 MG capsule Commonly known as:  KEFLEX Take 1 capsule (500 mg total) by mouth 3 (three) times daily for 10 days.   fluconazole 100 MG tablet Commonly known as:  DIFLUCAN Take 1 tablet (100 mg total) by mouth daily for 4 days.   glucosamine-chondroitin 500-400 MG tablet Take 1 tablet by mouth 4 (four) times daily.   ibuprofen 600 MG tablet Commonly known as:  ADVIL,MOTRIN Take 1 tablet (600 mg total) by mouth every 8 (eight) hours as needed.   Interferon Beta-1a 44 MCG/0.5ML Soaj Commonly known as:  REBIF REBIDOSE Inject 0.5 mLs into the skin every Monday, Wednesday, and Friday.   LEG VEIN & CIRCULATION Tabs Take 1 tablet by mouth as directed.   levETIRAcetam 500 MG tablet Commonly known as:  KEPPRA Take 1 tablet (500 mg total) by mouth 2 (two) times daily.   loratadine 10 MG tablet Commonly known as:  CLARITIN Take 10 mg by mouth daily.   MULTIVITAMIN PO Take 1 tablet by mouth daily.   HAIR SKIN AND NAILS FORMULA Tabs Take 1 tablet by mouth daily.   PRESERVISION AREDS 2 Caps Take 1 capsule by mouth 2 (two) times daily.   nystatin powder Commonly known as:  MYCOSTATIN/NYSTOP Apply topically 3 (three) times daily.   oxybutynin 10 MG 24 hr tablet Commonly known as:  DITROPAN-XL Take 2 tablets (20 mg total) by mouth daily.   tiZANidine 4 MG tablet Commonly known as:  ZANAFLEX TAKE 1/2 TABLETS (2 MG TOTAL) BY MOUTH 2 (TWO) TIMES DAILY AS NEEDED (SPASTICITY UNCONTROLLED).   Turmeric 500 MG Caps Take 500 mg by mouth daily.   vitamin  C 500 MG tablet Commonly known as:  ASCORBIC ACID Take 500 mg by mouth daily.        Allergies:  Allergies  Allergen Reactions  . Fentanyl Nausea And Vomiting and Nausea Only    vomiting Was given in PACU x3 each time patient got sick vomiting Was given in PACU x3 each time patient got sick  . Sulfa Antibiotics Hives and Other (See Comments)    Light headed, over heated    Family History: Family History  Problem Relation Age of Onset  . Cancer Father        skin  . Heart disease Father   . Heart attack Father        heart attack in his 84's  . Thyroid disease Sister   . Ovarian cancer Cousin   . Breast cancer Maternal Aunt 60  . Breast cancer Maternal Grandmother 41  . Bladder Cancer Neg Hx   . Kidney cancer Neg Hx     Social History:  reports that she has never smoked. She has never used smokeless tobacco. She reports that she does not drink alcohol or use drugs.  ROS: UROLOGY Frequent Urination?: Yes Hard to postpone urination?: Yes Burning/pain with urination?: No Get up at night to urinate?: Yes Leakage of urine?: Yes Urine stream starts and stops?: Yes Trouble starting stream?: No Do you have to strain to urinate?: No Blood in urine?: No Urinary tract infection?: Yes Sexually transmitted disease?: No Injury to kidneys or bladder?: No Painful intercourse?: No Weak stream?: No Currently pregnant?: No Vaginal bleeding?: No Last menstrual period?: n  Gastrointestinal Nausea?: No Vomiting?: No Indigestion/heartburn?: No Diarrhea?: No Constipation?: No  Constitutional Fever: No Night sweats?: No Weight loss?: No Fatigue?: No  Skin Skin rash/lesions?: No Itching?: No  Eyes Blurred vision?: No Double vision?: No  Ears/Nose/Throat Sore throat?: No Sinus problems?: No  Hematologic/Lymphatic Swollen glands?: No Easy bruising?: No  Cardiovascular Leg swelling?: Yes Chest pain?: No  Respiratory Cough?: No Shortness of breath?: No  Endocrine Excessive thirst?: No  Musculoskeletal Back pain?: No Joint pain?:  Yes  Neurological Headaches?: No Dizziness?: No  Psychologic Depression?: No Anxiety?: No  Physical Exam: BP 137/81   Pulse 67   SpO2 99%   Constitutional: Well nourished. Alert and oriented, No acute distress.  Patient ambulates by wheelchair. Cardiovascular: No clubbing, cyanosis, or edema. Respiratory: Normal respiratory effort, no increased work of breathing. GU: No CVA tenderness.  No bladder fullness or masses.  Atrophic external genitalia, sparce pubic hair distribution, no lesions.  Normal urethral meatus, no lesions, no prolapse, no discharge.   No urethral masses, tenderness and/or tenderness. No bladder fullness, tenderness or masses.  E vagina mucosa, poor estrogen effect, bloody discharge on glove, no lesions, poor pelvic support, grade 2 cystocele and no rectocele noted.  No cervical motion tenderness.  Uterus is freely mobile and non-fixed.  No adnexal/parametria masses or tenderness noted.  Anus and perineum are without rashes or lesions. Skin: No rashes, bruises or suspicious lesions. Neurologic: Grossly intact, no focal deficits, moving all 4 extremities.  Bilateral lymphoedema. Psychiatric: Normal mood and affect.   Laboratory Data: Lab Results  Component Value Date   WBC 4.6 02/04/2018   HGB 12.1 02/04/2018   HCT 37.2 02/04/2018   MCV 92.8 02/04/2018   PLT 142 (L) 02/04/2018    Lab Results  Component Value Date   CREATININE 0.54 02/04/2018    Lab Results  Component Value Date   AST 25 02/02/2018  Lab Results  Component Value Date   ALT 24 02/02/2018   I have reviewed the labs.  Assessment & Plan:    1. History of urinary retention - Has indwelling foley - Discussed changing to a SPT if appropriate once RUS is completed and no alarming findings are found and she completes her gynecology visit and the post-menopausal bleeding is determined not to be from an uterine cancer  2. Vaginal atrophy - Not a candidate for vaginal estrogen cream due to  history of breast cancer - She is at increased risk for UTI's due to the vaginal atrophy  3. History of rUTI - Reviewed UTI prevention strategies - Reviewed signs and symptoms of an UTI and explained that she is likely colonized as she was asymptomatic at the time of her ED visit -urine culture is pending from ED - will prescribe antibiotic if becomes symptomatic   4. Postmenopausal bleeding - Noted blood on pelvic exam today - Will referred to gynecologyfor further evaluation and management  Return for pending urine culture .  These notes generated with voice recognition software. I apologize for typographical errors.  Zara Council, PA-C  [facility] [address] [place], [place] [address] [phone]  I, Adele Schilder, am acting as a Education administrator for [employer], PA-C.   I have reviewed the above documentation for accuracy and completeness, and I agree with the above.    Zara Council, PA-C

## 2018-03-28 DIAGNOSIS — I890 Lymphedema, not elsewhere classified: Secondary | ICD-10-CM | POA: Diagnosis not present

## 2018-03-28 DIAGNOSIS — G40909 Epilepsy, unspecified, not intractable, without status epilepticus: Secondary | ICD-10-CM | POA: Diagnosis not present

## 2018-03-28 DIAGNOSIS — G35 Multiple sclerosis: Secondary | ICD-10-CM | POA: Diagnosis not present

## 2018-03-28 DIAGNOSIS — N319 Neuromuscular dysfunction of bladder, unspecified: Secondary | ICD-10-CM | POA: Diagnosis not present

## 2018-03-28 DIAGNOSIS — K592 Neurogenic bowel, not elsewhere classified: Secondary | ICD-10-CM | POA: Diagnosis not present

## 2018-03-28 DIAGNOSIS — L304 Erythema intertrigo: Secondary | ICD-10-CM | POA: Diagnosis not present

## 2018-03-29 ENCOUNTER — Telehealth: Payer: Self-pay | Admitting: Family Medicine

## 2018-03-29 ENCOUNTER — Ambulatory Visit: Payer: Medicare Other | Admitting: Family Medicine

## 2018-03-29 LAB — URINE CULTURE: Culture: >=100000 — AB

## 2018-03-29 NOTE — Telephone Encounter (Signed)
Copied from [place] [id number]. Topic: General - Other >> Mar 29, 2018  3:45 PM Windy Kalata wrote: Reason for CRM: Darius from [facility] home health is calling verifying that the orders were received, on 11/16/17?  Call back is [phone]  Okay to leave message

## 2018-03-30 ENCOUNTER — Telehealth: Payer: Self-pay

## 2018-03-30 DIAGNOSIS — I890 Lymphedema, not elsewhere classified: Secondary | ICD-10-CM | POA: Diagnosis not present

## 2018-03-30 DIAGNOSIS — L304 Erythema intertrigo: Secondary | ICD-10-CM | POA: Diagnosis not present

## 2018-03-30 DIAGNOSIS — K592 Neurogenic bowel, not elsewhere classified: Secondary | ICD-10-CM | POA: Diagnosis not present

## 2018-03-30 DIAGNOSIS — N319 Neuromuscular dysfunction of bladder, unspecified: Secondary | ICD-10-CM | POA: Diagnosis not present

## 2018-03-30 DIAGNOSIS — G40909 Epilepsy, unspecified, not intractable, without status epilepticus: Secondary | ICD-10-CM | POA: Diagnosis not present

## 2018-03-30 DIAGNOSIS — G35 Multiple sclerosis: Secondary | ICD-10-CM | POA: Diagnosis not present

## 2018-03-30 MED ORDER — CIPROFLOXACIN HCL 500 MG PO TABS: 500.0000 mg | ORAL_TABLET | Freq: Two times a day (BID) | ORAL | 0 refills | Status: AC

## 2018-03-30 NOTE — Progress Notes (Signed)
ED Antimicrobial Stewardship Positive Culture Follow Up   Rebecca [name] is an 58 y.o. female who presented to [facility] on 03/26/2018 with a chief complaint of  Chief Complaint  Patient presents with  . foley catheter out    Recent Results (from the past 720 hour(s))  Urine culture     Status: Abnormal   Collection Time: 03/02/18 10:37 AM  Result Value Ref Range Status   Specimen Description URINE, RANDOM  Final   Special Requests   Final    NONE Performed at [facility], [address] [place], [place] [address]    Culture >=100,000 COLONIES/mL [facility] MORGANII (A)  Final   Report Status 03/04/2018 FINAL  Final   Organism ID, Bacteria MORGANELLA MORGANII (A)  Final      Susceptibility   Morganella morganii - MIC*    AMPICILLIN >=32 RESISTANT Resistant     CEFAZOLIN >=64 RESISTANT Resistant     CEFTRIAXONE <=1 SENSITIVE Sensitive     CIPROFLOXACIN 1 SENSITIVE Sensitive     GENTAMICIN <=1 SENSITIVE Sensitive     IMIPENEM 4 SENSITIVE Sensitive     NITROFURANTOIN RESISTANT Resistant     TRIMETH/SULFA <=20 SENSITIVE Sensitive     AMPICILLIN/SULBACTAM >=32 RESISTANT Resistant     PIP/TAZO <=4 SENSITIVE Sensitive     * >=100,000 COLONIES/mL MORGANELLA MORGANII  Urine culture     Status: Abnormal   Collection Time: 03/26/18  4:44 PM  Result Value Ref Range Status   Specimen Description   Final    URINE, RANDOM Performed at [facility], [address], [place], [place] [address]    Special Requests   Final    NONE Performed at [facility], [address] [place], [place] [address]    Culture >=100,000 COLONIES/mL [facility] MORGANII (A)  Final   Report Status 03/29/2018 FINAL  Final   Organism ID, Bacteria MORGANELLA MORGANII (A)  Final      Susceptibility   Morganella morganii - MIC*    AMPICILLIN >=32 RESISTANT Resistant     CEFAZOLIN >=64 RESISTANT Resistant     CEFTRIAXONE <=1 SENSITIVE Sensitive     CIPROFLOXACIN 1 SENSITIVE  Sensitive     GENTAMICIN <=1 SENSITIVE Sensitive     IMIPENEM 4 SENSITIVE Sensitive     NITROFURANTOIN RESISTANT Resistant     TRIMETH/SULFA <=20 SENSITIVE Sensitive     AMPICILLIN/SULBACTAM 16 INTERMEDIATE Intermediate     PIP/TAZO <=4 SENSITIVE Sensitive     * >=100,000 COLONIES/mL MORGANELLA MORGANII    [x]  Treated with cephalexin, organism resistant to prescribed antimicrobial []  Patient discharged originally without antimicrobial agent and treatment is now indicated  This patient is seen by Zara Council, [facility] (urology) as an outpatient and was seen on the following day of the ED visit here at [facility]. She felt that the culture was a contaminant due to the presence her chronic indwelling catheter. However, the final culture results were not available so I have reached out to the urology office to make them aware of the results. I spoke with Katie at the office and she will make [facility] aware of the latest results and reach out to the patient regarding the plan of care.  Dallie Piles, PharmD 03/30/2018, 1:35 PM Clinical Pharmacist Monday - Friday phone -  [phone] Saturday - Sunday phone - [phone]

## 2018-03-30 NOTE — Telephone Encounter (Signed)
Received call from Barbaraann Rondo [phone] regarding urine culture results. Resistant to Keflex prescribed in ED.  Relayed to Zara Council, PA. Sent in Cipro 500mg  to [facility] per [place] and called and advised patient.

## 2018-04-02 DIAGNOSIS — N319 Neuromuscular dysfunction of bladder, unspecified: Secondary | ICD-10-CM | POA: Diagnosis not present

## 2018-04-02 DIAGNOSIS — G40909 Epilepsy, unspecified, not intractable, without status epilepticus: Secondary | ICD-10-CM | POA: Diagnosis not present

## 2018-04-02 DIAGNOSIS — L304 Erythema intertrigo: Secondary | ICD-10-CM | POA: Diagnosis not present

## 2018-04-02 DIAGNOSIS — G35 Multiple sclerosis: Secondary | ICD-10-CM | POA: Diagnosis not present

## 2018-04-02 DIAGNOSIS — K592 Neurogenic bowel, not elsewhere classified: Secondary | ICD-10-CM | POA: Diagnosis not present

## 2018-04-02 DIAGNOSIS — I890 Lymphedema, not elsewhere classified: Secondary | ICD-10-CM | POA: Diagnosis not present

## 2018-04-03 ENCOUNTER — Telehealth: Payer: Self-pay | Admitting: Urology

## 2018-04-03 NOTE — Telephone Encounter (Signed)
Benjamine Mola, Supervisor from [facility] is requesting orders for foley cath care, asks for a call at [phone]. Please advise. Thanks.

## 2018-04-04 ENCOUNTER — Telehealth: Payer: Self-pay | Admitting: Urology

## 2018-04-04 DIAGNOSIS — N319 Neuromuscular dysfunction of bladder, unspecified: Secondary | ICD-10-CM | POA: Diagnosis not present

## 2018-04-04 DIAGNOSIS — L304 Erythema intertrigo: Secondary | ICD-10-CM | POA: Diagnosis not present

## 2018-04-04 DIAGNOSIS — K592 Neurogenic bowel, not elsewhere classified: Secondary | ICD-10-CM | POA: Diagnosis not present

## 2018-04-04 DIAGNOSIS — G35 Multiple sclerosis: Secondary | ICD-10-CM | POA: Diagnosis not present

## 2018-04-04 DIAGNOSIS — G40909 Epilepsy, unspecified, not intractable, without status epilepticus: Secondary | ICD-10-CM | POA: Diagnosis not present

## 2018-04-04 DIAGNOSIS — I890 Lymphedema, not elsewhere classified: Secondary | ICD-10-CM | POA: Diagnosis not present

## 2018-04-04 NOTE — Telephone Encounter (Signed)
It appears to be in progress they need clarification, calling today

## 2018-04-04 NOTE — Telephone Encounter (Signed)
Called Benjamine Mola had left for the day, suggests to call back tomorrow

## 2018-04-04 NOTE — Telephone Encounter (Signed)
Did we get Home Health services for her Foley catheter exchanges?

## 2018-04-05 ENCOUNTER — Encounter: Payer: Medicare Other | Admitting: Obstetrics & Gynecology

## 2018-04-05 NOTE — Telephone Encounter (Signed)
Rebecca [name] calling back an stated that he need this documented and will send a fax over. Please sign and return.

## 2018-04-06 ENCOUNTER — Ambulatory Visit: Payer: Medicare Other | Admitting: Family Medicine

## 2018-04-06 NOTE — Telephone Encounter (Signed)
Spoke with Benjamine Mola from [facility] and gave a verbal order for patient to have a monthly foley exchanges. 19fr with bag and PRN irrigation for sediment

## 2018-04-09 NOTE — Progress Notes (Signed)
[place]  [phone] [phone]  ID: [name] OB: [birth year]  MR#: [id number]  VPX#:[id number]  Patient Care Team: [name], MD as PCP - General (Family Medicine) Bobetta Lime, MD as Referring Physician Byrnett, Forest Gleason, MD (General Surgery) Penni Bombard, MD as Consulting Physician (Neurology) Lloyd Huger, MD as Consulting Physician (Oncology) Leonie Man, MD as Consulting Physician (Cardiology) Minna Merritts, MD as Consulting Physician (Cardiology) Laneta Simmers as Physician Assistant (Urology) Cathi Roan, [facility] (Pharmacist) Benedetto Goad, RN as Case Manager  CHIEF COMPLAINT: 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 BRCA negative.  INTERVAL HISTORY: Patient returns to clinic today for routine 4-monthevaluation.  She recently had repeated bladder infections and is now requiring a Foley catheter.  She reports this will be placed by permanent suprapubic catheter in the near future.  She otherwise feels well.  She does not complain of pain today. Her lower extremity edema is unchanged. She has no new neurologic complaints.  She denies any chest pain or shortness of breath. She denies any nausea, vomiting, constipation, or diarrhea.  She has no urinary complaints.  Patient offers no further specific complaints today.  REVIEW OF SYSTEMS:   Review of Systems  Constitutional: Negative for chills, fever and malaise/fatigue.  Respiratory: Negative for cough and shortness of breath.   Cardiovascular: Positive for leg swelling. Negative for chest pain.  Gastrointestinal: Negative for abdominal pain, constipation, diarrhea, nausea and vomiting.  Genitourinary: Negative.  Negative for dysuria, frequency and urgency.  Musculoskeletal: Positive for back pain.  Skin: Negative.  Negative for rash.  Neurological: Positive for focal weakness. Negative for sensory change, weakness and  headaches.  Psychiatric/Behavioral: Negative.  The patient is not nervous/anxious.     As per HPI. Otherwise, a complete review of systems is negative.  PAST MEDICAL HISTORY: Past Medical History:  Diagnosis Date  . Aortic valve disease    Mild AS / AI - most recent Echo demonstrated tricuspid aortic valve.  . Bacterial endocarditis    History of .  .Marland KitchenBilateral lower extremity edema    Noncardiac.  Chronic. LE Venous dopplers - negative for DVT.; Echocardiogram January 2016: Normal EF with normal wall motion and valve function. Only grade 1 diastolic dysfunction. EF 60-65%. Mild MR  . Breast cancer ([place] 12-31-13   Right breast, 12:00, 1.5 cm, T1c,N0 invasive mammary carcinoma, triple negative. --> Rx with Chemo  . Cervical stenosis of spine   . Herpes zoster   . IBS (irritable bowel syndrome)   . Lymphedema    [facility] . [place] [facility]   Walks from room to room @ home; but Wheelchair when going out.  .Marland KitchenNeuromuscular disorder ([place]    MS  . Seizures ([place]    Takes Keppra  . Syncope and collapse     PAST SURGICAL HISTORY: Past Surgical History:  Procedure Laterality Date  . ANKLE SURGERY     Left  . ANKLE SURGERY    . ANTERIOR CERVICAL DECOMP/DISCECTOMY FUSION  11/17/2011   Procedure: ANTERIOR CERVICAL DECOMPRESSION/DISCECTOMY FUSION 2 LEVELS;  Surgeon: JErline Levine MD;  [facility];  Service: Neurosurgery;  Laterality: N/A;  Cervical Five-Six Six-Seven Anterior cervical decompression/diskectomy/fusion  . BREAST BIOPSY Right 12-31-13   invasive mammary  . BREAST SURGERY Right 02/03/2014   Right simple mastectomy with sentinel node biopsy.  . CHOLECYSTECTOMY    . COLONOSCOPY  2014  .  Lower extremity venous Dopplers  Feb 27, 2013   No LE DVT  . MASTECTOMY Right 2015  . Port a cath insertion Right 01/19/2010  . PORT-A-CATH REMOVAL     right  . PORT-A-CATH REMOVAL Right 09/03/2013   Procedure: REMOVAL PORT-A-CATH;  Surgeon:  Conrad Mimbres, MD;  Location: [facility];  Service: Vascular;  Laterality: Right;  . TRANSTHORACIC ECHOCARDIOGRAM  03/2013; 02/2014   a) Normal LV size and function with EF 60-65%.; Cannot exclude bicuspid aortic valve with mild AS and mild AI.; b) Normal EF with normal wall motion and valve function x Mild MR. G2 DD. EF 60-65%. Tricuspid AoV  . UPPER GI ENDOSCOPY  2014    FAMILY HISTORY Family History  Problem Relation Age of Onset  . Cancer Father        skin  . Heart disease Father   . Heart attack Father        heart attack in his 80's  . Thyroid disease Sister   . Ovarian cancer Cousin   . Breast cancer Maternal Aunt 60  . Breast cancer Maternal Grandmother 12  . Bladder Cancer Neg Hx   . Kidney cancer Neg Hx        ADVANCED DIRECTIVES:    HEALTH MAINTENANCE: Social History   Tobacco Use  . Smoking status: Never Smoker  . Smokeless tobacco: Never Used  Substance Use Topics  . Alcohol use: No  . Drug use: No     Colonoscopy:  PAP:  Bone density:  Lipid panel:  Allergies  Allergen Reactions  . Fentanyl Nausea And Vomiting and Nausea Only    vomiting Was given in PACU x3 each time patient got sick vomiting Was given in PACU x3 each time patient got sick  . Sulfa Antibiotics Hives and Other (See Comments)    Light headed, over heated    Current Outpatient Medications  Medication Sig Dispense Refill  . amantadine (SYMMETREL) 100 MG capsule Take 1 capsule (100 mg total) by mouth 3 (three) times daily. 270 capsule 4  . baclofen (LIORESAL) 10 MG tablet TAKE 1 TO 2 TABLETS BY MOUTH 3 TIMES A DAY 540 tablet 2  . Disposable Gloves (ASSURANCE VINYL EXAM GLOVES) MISC Neurogenic bladder; MS; LON 99 months; for use for personal hygiene 50 each 11  . glucosamine-chondroitin 500-400 MG tablet Take 1 tablet by mouth 4 (four) times daily.    Marland Kitchen ibuprofen (ADVIL,MOTRIN) 600 MG tablet Take 1 tablet (600 mg total) by mouth every 8 (eight) hours as needed. 180 tablet 0  . Interferon  Beta-1a (REBIF REBIDOSE) 44 MCG/0.5ML SOAJ Inject 0.5 mLs into the skin every Monday, Wednesday, and Friday. 12 Syringe 11  . levETIRAcetam (KEPPRA) 500 MG tablet Take 1 tablet (500 mg total) by mouth 2 (two) times daily. 180 tablet 4  . loratadine (CLARITIN) 10 MG tablet Take 10 mg by mouth daily.    . Misc Natural Products (LEG VEIN & CIRCULATION) TABS Take 1 tablet by mouth as directed.    . Multiple Vitamins-Minerals (HAIR SKIN AND NAILS FORMULA) TABS Take 1 tablet by mouth daily.     . Multiple Vitamins-Minerals (MULTIVITAMIN PO) Take 1 tablet by mouth daily.     . Multiple Vitamins-Minerals (PRESERVISION AREDS 2) CAPS Take 1 capsule by mouth 2 (two) times daily.    Marland Kitchen nystatin (MYCOSTATIN/NYSTOP) powder Apply topically 3 (three) times daily. 45 g 0  . oxybutynin (DITROPAN-XL) 10 MG 24 hr tablet Take 2 tablets (20 mg total) by  mouth daily. 180 tablet 3  . tiZANidine (ZANAFLEX) 4 MG tablet TAKE 1/2 TABLETS (2 MG TOTAL) BY MOUTH 2 (TWO) TIMES DAILY AS NEEDED (SPASTICITY UNCONTROLLED). 90 tablet 0  . Turmeric 500 MG CAPS Take 500 mg by mouth daily.    . vitamin C (ASCORBIC ACID) 500 MG tablet Take 500 mg by mouth daily.     No current facility-administered medications for this visit.     OBJECTIVE: Vitals:   04/10/18 1409  BP: (!) 142/86  Pulse: 64  Resp: 18  Temp: (!) 97.4 F (36.3 C)     There is no height or weight on file to calculate BMI.    ECOG FS:2 - Symptomatic, <50% confined to bed  General: Well-developed, well-nourished, no acute distress.  Sitting in a wheelchair. Eyes: Pink conjunctiva, anicteric sclera. HEENT: Normocephalic, moist mucous membranes. Breast: Exam deferred today. Lungs: Clear to auscultation bilaterally. Heart: Regular rate and rhythm. No rubs, murmurs, or gallops. Abdomen: Soft, nontender, nondistended. No organomegaly noted, normoactive bowel sounds. Musculoskeletal: No edema, cyanosis, or clubbing. Neuro: Alert, answering all questions appropriately.  Cranial nerves grossly intact. Skin: No rashes or petechiae noted. Psych: Normal affect.  LAB RESULTS:  Lab Results  Component Value Date   NA 138 04/10/2018   K 3.6 04/10/2018   CL 106 04/10/2018   CO2 24 04/10/2018   GLUCOSE 98 04/10/2018   BUN 20 04/10/2018   CREATININE 0.50 04/10/2018   CALCIUM 9.0 04/10/2018   PROT 7.7 04/10/2018   ALBUMIN 4.2 04/10/2018   AST 24 04/10/2018   ALT 23 04/10/2018   ALKPHOS 88 04/10/2018   BILITOT 0.7 04/10/2018   GFRNONAA >60 04/10/2018   GFRAA >60 04/10/2018    Lab Results  Component Value Date   WBC 4.3 04/10/2018   NEUTROABS 2.7 04/10/2018   HGB 12.3 04/10/2018   HCT 36.5 04/10/2018   MCV 89.7 04/10/2018   PLT 112 (L) 04/10/2018      STUDIES: No results found.  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 BRCA negative.  PLAN:    1. Triple negative right breast cancer: No evidence of disease. Given her difficulties with Taxol and multiple sclerosis, treatment was discontinued after a total of 6 of 12 cycles of Taxol. Her last chemotherapy was July 17, 2014.  Because patient had a full mastectomy, she did not require adjuvant XRT. Given the fact that she is a triple negative cancer, she does not require an aromatase inhibitor. Patient's most recent mammogram on August 02, 2017 was reported as BI-RADS 1.  Repeat in June 2020.  CA 27-29 continues to be within normal limits at 18.6.  Return to clinic in 6 months for routine evaluation. 2. Multiple sclerosis: Continue evaluation and current treatment per neurology. Her primary neurologist is Dr. Andrey Spearman.  phone [phone], fax [phone]. 3. Lymphedema: Chronic and unchanged.  Continue treatment and wraps as prescribed. 4. Peripheral neuropathy: Patient does not complain of this today. 5. Pain: Patient is no longer taking narcotics. 6.  Thrombocytopenia: Mild.  Patient expressed understanding and was in agreement with this plan. She also  understands that She can call clinic at any time with any questions, concerns, or complaints.   Breast cancer   Staging form: Breast, AJCC 7th Edition     Pathologic stage from 05/24/2014: Stage IA (T1c, N0, cM0) - Signed by Lloyd Huger, MD on 05/24/2014   Lloyd Huger, MD   04/12/2018 6:44 AM

## 2018-04-10 ENCOUNTER — Encounter: Payer: Self-pay | Admitting: Oncology

## 2018-04-10 ENCOUNTER — Inpatient Hospital Stay (HOSPITAL_BASED_OUTPATIENT_CLINIC_OR_DEPARTMENT_OTHER): Payer: Medicare Other | Admitting: Oncology

## 2018-04-10 ENCOUNTER — Inpatient Hospital Stay: Payer: Medicare Other | Attending: Oncology

## 2018-04-10 VITALS — BP 142/86 | HR 64 | Temp 97.4°F | Resp 18

## 2018-04-10 DIAGNOSIS — Z9011 Acquired absence of right breast and nipple: Secondary | ICD-10-CM | POA: Insufficient documentation

## 2018-04-10 DIAGNOSIS — Z853 Personal history of malignant neoplasm of breast: Secondary | ICD-10-CM

## 2018-04-10 DIAGNOSIS — C50911 Malignant neoplasm of unspecified site of right female breast: Secondary | ICD-10-CM

## 2018-04-10 DIAGNOSIS — G629 Polyneuropathy, unspecified: Secondary | ICD-10-CM

## 2018-04-10 DIAGNOSIS — Z9221 Personal history of antineoplastic chemotherapy: Secondary | ICD-10-CM

## 2018-04-10 DIAGNOSIS — D696 Thrombocytopenia, unspecified: Secondary | ICD-10-CM

## 2018-04-10 DIAGNOSIS — G35 Multiple sclerosis: Secondary | ICD-10-CM | POA: Diagnosis not present

## 2018-04-10 LAB — COMPREHENSIVE METABOLIC PANEL
ALT: 23 U/L (ref 0–44)
AST: 24 U/L (ref 15–41)
Albumin: 4.2 g/dL (ref 3.5–5.0)
Alkaline Phosphatase: 88 U/L (ref 38–126)
Anion gap: 8 (ref 5–15)
BUN: 20 mg/dL (ref 6–20)
CO2: 24 mmol/L (ref 22–32)
Calcium: 9 mg/dL (ref 8.9–10.3)
Chloride: 106 mmol/L (ref 98–111)
Creatinine, Ser: 0.5 mg/dL (ref 0.44–1.00)
GFR calc non Af Amer: >60 mL/min (ref >60)
Glucose, Bld: 98 mg/dL (ref 70–99)
POTASSIUM: 3.6 mmol/L (ref 3.5–5.1)
Sodium: 138 mmol/L (ref 135–145)
Total Bilirubin: 0.7 mg/dL (ref 0.3–1.2)
Total Protein: 7.7 g/dL (ref 6.5–8.1)

## 2018-04-10 LAB — CBC WITH DIFFERENTIAL/PLATELET
Abs Immature Granulocytes: 0.02 10*3/uL (ref 0.00–0.07)
Basophils Absolute: 0 10*3/uL (ref 0.0–0.1)
Basophils Relative: 0 %
EOS PCT: 2 %
Eosinophils Absolute: 0.1 10*3/uL (ref 0.0–0.5)
HCT: 36.5 % (ref 36.0–46.0)
Hemoglobin: 12.3 g/dL (ref 12.0–15.0)
Immature Granulocytes: 1 %
Lymphocytes Relative: 26 %
Lymphs Abs: 1.1 10*3/uL (ref 0.7–4.0)
MCH: 30.2 pg (ref 26.0–34.0)
MCHC: 33.7 g/dL (ref 30.0–36.0)
MCV: 89.7 fL (ref 80.0–100.0)
MONOS PCT: 8 %
Monocytes Absolute: 0.4 10*3/uL (ref 0.1–1.0)
Neutro Abs: 2.7 10*3/uL (ref 1.7–7.7)
Neutrophils Relative %: 63 %
Platelets: 112 10*3/uL — ABNORMAL LOW (ref 150–400)
RBC: 4.07 MIL/uL (ref 3.87–5.11)
RDW: 12.2 % (ref 11.5–15.5)
WBC: 4.3 10*3/uL (ref 4.0–10.5)
nRBC: 0 % (ref 0.0–0.2)

## 2018-04-10 MED ORDER — HEPARIN SOD (PORK) LOCK FLUSH 100 UNIT/ML IV SOLN
500.0000 [IU] | Freq: Once | INTRAVENOUS | Status: AC
  Administered 2018-04-10: 500 [IU] via INTRAVENOUS

## 2018-04-10 MED ORDER — SODIUM CHLORIDE 0.9% FLUSH
10.0000 mL | Freq: Once | INTRAVENOUS | Status: AC
  Administered 2018-04-10: 10 mL via INTRAVENOUS
  Filled 2018-04-10: qty 10

## 2018-04-11 ENCOUNTER — Ambulatory Visit: Payer: Medicare Other

## 2018-04-11 DIAGNOSIS — L304 Erythema intertrigo: Secondary | ICD-10-CM | POA: Diagnosis not present

## 2018-04-11 DIAGNOSIS — K592 Neurogenic bowel, not elsewhere classified: Secondary | ICD-10-CM | POA: Diagnosis not present

## 2018-04-11 DIAGNOSIS — I890 Lymphedema, not elsewhere classified: Secondary | ICD-10-CM | POA: Diagnosis not present

## 2018-04-11 DIAGNOSIS — G40909 Epilepsy, unspecified, not intractable, without status epilepticus: Secondary | ICD-10-CM | POA: Diagnosis not present

## 2018-04-11 DIAGNOSIS — N319 Neuromuscular dysfunction of bladder, unspecified: Secondary | ICD-10-CM | POA: Diagnosis not present

## 2018-04-11 DIAGNOSIS — G35 Multiple sclerosis: Secondary | ICD-10-CM | POA: Diagnosis not present

## 2018-04-11 LAB — CANCER ANTIGEN 27.29: CA 27.29: 18.6 U/mL (ref 0.0–38.6)

## 2018-04-12 DIAGNOSIS — G40909 Epilepsy, unspecified, not intractable, without status epilepticus: Secondary | ICD-10-CM | POA: Diagnosis not present

## 2018-04-12 DIAGNOSIS — L304 Erythema intertrigo: Secondary | ICD-10-CM | POA: Diagnosis not present

## 2018-04-12 DIAGNOSIS — G35 Multiple sclerosis: Secondary | ICD-10-CM | POA: Diagnosis not present

## 2018-04-12 DIAGNOSIS — K592 Neurogenic bowel, not elsewhere classified: Secondary | ICD-10-CM | POA: Diagnosis not present

## 2018-04-12 DIAGNOSIS — I890 Lymphedema, not elsewhere classified: Secondary | ICD-10-CM | POA: Diagnosis not present

## 2018-04-12 DIAGNOSIS — N319 Neuromuscular dysfunction of bladder, unspecified: Secondary | ICD-10-CM | POA: Diagnosis not present

## 2018-04-13 ENCOUNTER — Telehealth: Payer: Self-pay | Admitting: Family Medicine

## 2018-04-13 DIAGNOSIS — K592 Neurogenic bowel, not elsewhere classified: Secondary | ICD-10-CM | POA: Diagnosis not present

## 2018-04-13 DIAGNOSIS — G40909 Epilepsy, unspecified, not intractable, without status epilepticus: Secondary | ICD-10-CM | POA: Diagnosis not present

## 2018-04-13 DIAGNOSIS — N319 Neuromuscular dysfunction of bladder, unspecified: Secondary | ICD-10-CM | POA: Diagnosis not present

## 2018-04-13 DIAGNOSIS — I890 Lymphedema, not elsewhere classified: Secondary | ICD-10-CM | POA: Diagnosis not present

## 2018-04-13 DIAGNOSIS — L304 Erythema intertrigo: Secondary | ICD-10-CM | POA: Diagnosis not present

## 2018-04-13 DIAGNOSIS — G35 Multiple sclerosis: Secondary | ICD-10-CM | POA: Diagnosis not present

## 2018-04-13 NOTE — Telephone Encounter (Signed)
Copied from [place] [id number]. Topic: Quick Communication - Home Health Verbal Orders >> Apr 13, 2018  1:45 PM Bea Graff, NT wrote: Caller/Agency: [facility] Number: [phone] Requesting OT/PT/Skilled Nursing/Social Work: OT Frequency: Continue care for 2 times a week for 3 weeks

## 2018-04-13 NOTE — Telephone Encounter (Signed)
Okay 

## 2018-04-16 NOTE — Telephone Encounter (Signed)
Verbal okay per Dr. Sanda Klein given to [place] with [facility].

## 2018-04-17 ENCOUNTER — Ambulatory Visit: Payer: Medicare Other | Admitting: Diagnostic Neuroimaging

## 2018-04-17 DIAGNOSIS — R262 Difficulty in walking, not elsewhere classified: Secondary | ICD-10-CM | POA: Diagnosis not present

## 2018-04-17 DIAGNOSIS — G40909 Epilepsy, unspecified, not intractable, without status epilepticus: Secondary | ICD-10-CM | POA: Diagnosis not present

## 2018-04-17 DIAGNOSIS — Z79891 Long term (current) use of opiate analgesic: Secondary | ICD-10-CM | POA: Diagnosis not present

## 2018-04-17 DIAGNOSIS — K592 Neurogenic bowel, not elsewhere classified: Secondary | ICD-10-CM | POA: Diagnosis not present

## 2018-04-17 DIAGNOSIS — N319 Neuromuscular dysfunction of bladder, unspecified: Secondary | ICD-10-CM | POA: Diagnosis not present

## 2018-04-17 DIAGNOSIS — Z9011 Acquired absence of right breast and nipple: Secondary | ICD-10-CM | POA: Diagnosis not present

## 2018-04-17 DIAGNOSIS — R531 Weakness: Secondary | ICD-10-CM | POA: Diagnosis not present

## 2018-04-17 DIAGNOSIS — G35 Multiple sclerosis: Secondary | ICD-10-CM | POA: Diagnosis not present

## 2018-04-17 DIAGNOSIS — M542 Cervicalgia: Secondary | ICD-10-CM | POA: Diagnosis not present

## 2018-04-17 DIAGNOSIS — G8929 Other chronic pain: Secondary | ICD-10-CM | POA: Diagnosis not present

## 2018-04-17 DIAGNOSIS — L304 Erythema intertrigo: Secondary | ICD-10-CM | POA: Diagnosis not present

## 2018-04-17 DIAGNOSIS — Z853 Personal history of malignant neoplasm of breast: Secondary | ICD-10-CM | POA: Diagnosis not present

## 2018-04-17 DIAGNOSIS — I890 Lymphedema, not elsewhere classified: Secondary | ICD-10-CM | POA: Diagnosis not present

## 2018-04-17 DIAGNOSIS — Z960 Presence of urogenital implants: Secondary | ICD-10-CM | POA: Diagnosis not present

## 2018-04-17 DIAGNOSIS — Z596 Low income: Secondary | ICD-10-CM | POA: Diagnosis not present

## 2018-04-18 ENCOUNTER — Ambulatory Visit (INDEPENDENT_AMBULATORY_CARE_PROVIDER_SITE_OTHER): Payer: Medicare Other | Admitting: Family Medicine

## 2018-04-18 ENCOUNTER — Encounter: Payer: Self-pay | Admitting: Family Medicine

## 2018-04-18 ENCOUNTER — Other Ambulatory Visit: Payer: Self-pay

## 2018-04-18 DIAGNOSIS — K592 Neurogenic bowel, not elsewhere classified: Secondary | ICD-10-CM

## 2018-04-18 DIAGNOSIS — D696 Thrombocytopenia, unspecified: Secondary | ICD-10-CM

## 2018-04-18 DIAGNOSIS — Z853 Personal history of malignant neoplasm of breast: Secondary | ICD-10-CM | POA: Diagnosis not present

## 2018-04-18 DIAGNOSIS — N3644 Muscular disorders of urethra: Secondary | ICD-10-CM

## 2018-04-18 DIAGNOSIS — R269 Unspecified abnormalities of gait and mobility: Secondary | ICD-10-CM | POA: Diagnosis not present

## 2018-04-18 DIAGNOSIS — G40209 Localization-related (focal) (partial) symptomatic epilepsy and epileptic syndromes with complex partial seizures, not intractable, without status epilepticus: Secondary | ICD-10-CM

## 2018-04-18 DIAGNOSIS — G35 Multiple sclerosis: Secondary | ICD-10-CM

## 2018-04-18 DIAGNOSIS — I890 Lymphedema, not elsewhere classified: Secondary | ICD-10-CM | POA: Diagnosis not present

## 2018-04-18 DIAGNOSIS — G35D Multiple sclerosis, unspecified: Secondary | ICD-10-CM

## 2018-04-18 MED ORDER — IBUPROFEN 600 MG PO TABS: 600.0000 mg | ORAL_TABLET | Freq: Three times a day (TID) | ORAL | 1 refills | Status: DC | PRN

## 2018-04-18 MED ORDER — FLUTICASONE PROPIONATE 50 MCG/ACT NA SUSP: 2.0000 | Freq: Every day | NASAL | 11 refills | Status: DC

## 2018-04-18 NOTE — Progress Notes (Signed)
BP 128/78   Pulse 62   Temp (!) 97.5 F (36.4 C) (Oral)   Resp 12   Ht 5\' 4"  (1.626 m)   SpO2 98%   BMI 37.25 kg/m    Subjective:    Patient ID: Rebecca [name], female    DOB: 1960-06-07, 58 y.o.   MRN: [id number]  HPI: Rebecca [name] is a 58 y.o. female  Chief Complaint  Patient presents with  . Follow-up    catheter will be taken out and pubic one placed     HPI  Patient here for f/u  She is having OT; standing more, able to stand for 8 minutes alone; they are working with her upper extremities  PT has stopped; they were supposed to come by and they said they were going to dismiss her; they were going to help transition her to the car; she was walking to the kitchen and back, 30 feet with a walker; she is using stretchy bands to exercise with; band helps pull the right leg, PT attached a loop to the shoe and that helps; right leg still drags some  She is seeing urologist; going to have the current catheter removed and will get a suprapubic catheter; sleeping better; not worn out all day because she was up before all night going to the bathroom  Last seizure was 3 years ago; they tried to take her off of the keppra and she didn't do very well; they put her back on and doing well; neurologist is Dr. Tish Frederickson  Hx of triple negative right breast cancer; full mastectomy, no adjuvant XRT; no aromatase therapy;  just saw Dr. Grayland Ormond on March 3rd; no breast exam that day; she gets her mammogram in June or July; after that, she can go yearly; slightly low platelets per heme labs; no bruising and possibly from the Rebif  She has been sneezing sometimes; a few times a day, maybe every other day; maybe dust floating around; little raspy cough, nothing ongoing  She is taking ibuprofen; she takes that for aches and pain; she is aware it can cause bleeding; no acid reflux or stomach pain or blood in the stool; aware long-term use can hurt the kidneys she knows again today discussed   Depression screen [facility] 2/9 04/18/2018 12/26/2017 09/08/2017 06/23/2017 02/09/2017  Decreased Interest 0 0 0 0 0  Down, Depressed, Hopeless 0 0 0 0 0  PHQ - 2 Score 0 0 0 0 0  Altered sleeping 0 0 - - -  Tired, decreased energy 0 0 - - -  Change in appetite 0 0 - - -  Feeling bad or failure about yourself  0 0 - - -  Trouble concentrating 0 0 - - -  Moving slowly or fidgety/restless 0 0 - - -  Suicidal thoughts 0 0 - - -  PHQ-9 Score 0 0 - - -  Difficult doing work/chores Not difficult at all Not difficult at all - - -  Some recent data might be hidden   MD note: not depressed  Fall Risk  04/18/2018 02/26/2018 12/26/2017 11/17/2017 09/08/2017  Falls in the past year? 0 0 1 No No  Number falls in past yr: 0 0 1 - -  Injury with Fall? 0 0 0 - -  Risk Factor Category  - - - - -  Risk for fall due to : - - - - -   MD note: she has had a few falls after the first  of the year; it is getting better; that was before PT started No injuries with those falls  Relevant past medical, surgical, family and social history reviewed Past Medical History:  Diagnosis Date  . Aortic valve disease    Mild AS / AI - most recent Echo demonstrated tricuspid aortic valve.  . Bacterial endocarditis    History of .  Marland Kitchen Bilateral lower extremity edema    Noncardiac.  Chronic. LE Venous dopplers - negative for DVT.; Echocardiogram January 2016: Normal EF with normal wall motion and valve function. Only grade 1 diastolic dysfunction. EF 60-65%. Mild MR  . Breast cancer ([place]) 12-31-13   Right breast, 12:00, 1.5 cm, T1c,N0 invasive mammary carcinoma, triple negative. --> Rx with Chemo  . Cervical stenosis of spine   . Herpes zoster   . IBS (irritable bowel syndrome)   . Lymphedema    has legs wrapped at [facility]  . Multiple sclerosis ([place]) 2001   Walks from room to room @ home; but Wheelchair when going out.  Marland Kitchen Neuromuscular disorder ([place])    MS  . Seizures ([place])    Takes Keppra  . Syncope and collapse    Past  Surgical History:  Procedure Laterality Date  . ANKLE SURGERY     Left  . ANKLE SURGERY    . ANTERIOR CERVICAL DECOMP/DISCECTOMY FUSION  11/17/2011   Procedure: ANTERIOR CERVICAL DECOMPRESSION/DISCECTOMY FUSION 2 LEVELS;  Surgeon: Erline Levine, MD;  Location: [facility];  Service: Neurosurgery;  Laterality: N/A;  Cervical Five-Six Six-Seven Anterior cervical decompression/diskectomy/fusion  . BREAST BIOPSY Right 12-31-13   invasive mammary  . BREAST SURGERY Right 02/03/2014   Right simple mastectomy with sentinel node biopsy.  . CHOLECYSTECTOMY    . COLONOSCOPY  2014  . Lower extremity venous Dopplers  Feb 27, 2013   No LE DVT  . MASTECTOMY Right 2015  . Port a cath insertion Right 01/19/2010  . PORT-A-CATH REMOVAL     right  . PORT-A-CATH REMOVAL Right 09/03/2013   Procedure: REMOVAL PORT-A-CATH;  Surgeon: Conrad South Heart, MD;  Location: [facility];  Service: Vascular;  Laterality: Right;  . TRANSTHORACIC ECHOCARDIOGRAM  03/2013; 02/2014   a) Normal LV size and function with EF 60-65%.; Cannot exclude bicuspid aortic valve with mild AS and mild AI.; b) Normal EF with normal wall motion and valve function x Mild MR. G2 DD. EF 60-65%. Tricuspid AoV  . UPPER GI ENDOSCOPY  2014   Family History  Problem Relation Age of Onset  . Cancer Father        skin  . Heart disease Father   . Heart attack Father        heart attack in his 55's  . Thyroid disease Sister   . Ovarian cancer Cousin   . Breast cancer Maternal Aunt 60  . Breast cancer Maternal Grandmother 29  . Bladder Cancer Neg Hx   . Kidney cancer Neg Hx    Social History   Tobacco Use  . Smoking status: Never Smoker  . Smokeless tobacco: Never Used  Substance Use Topics  . Alcohol use: No  . Drug use: No     Office Visit from 04/18/2018 in [facility]  AUDIT-C Score  0      Interim medical history since last visit reviewed. Allergies and medications reviewed  Review of Systems Per HPI unless  specifically indicated above     Objective:    BP 128/78   Pulse 62   Temp (!)  97.5 F (36.4 C) (Oral)   Resp 12   Ht 5\' 4"  (1.626 m)   SpO2 98%   BMI 37.25 kg/m   Wt Readings from Last 3 Encounters:  03/26/18 217 lb (98.4 kg)  03/02/18 217 lb (98.4 kg)  02/26/18 217 lb (98.4 kg)    Physical Exam Constitutional:      General: She is not in acute distress.    Appearance: She is well-developed. She is obese. She is not diaphoretic.  HENT:     Head: Normocephalic and atraumatic.  Eyes:     General: No scleral icterus. Neck:     Thyroid: No thyromegaly.  Cardiovascular:     Rate and Rhythm: Normal rate and regular rhythm.     Heart sounds: Normal heart sounds. No murmur.  Pulmonary:     Effort: Pulmonary effort is normal. No respiratory distress.     Breath sounds: Normal breath sounds. No wheezing.  Abdominal:     General: Bowel sounds are normal. There is no distension.     Palpations: Abdomen is soft.  Musculoskeletal:     Right lower leg: Edema present.     Left lower leg: Edema present.  Skin:    General: Skin is warm and dry.     Coloration: Skin is not pale.  Neurological:     Mental Status: She is alert.  Psychiatric:        Behavior: Behavior normal.        Thought Content: Thought content normal.        Judgment: Judgment normal.     Results for orders placed or performed in visit on 04/10/18  Cancer antigen 27.29  Result Value Ref Range   CA 27.29 18.6 0.0 - 38.6 U/mL  Comprehensive metabolic panel  Result Value Ref Range   Sodium 138 135 - 145 mmol/L   Potassium 3.6 3.5 - 5.1 mmol/L   Chloride 106 98 - 111 mmol/L   CO2 24 22 - 32 mmol/L   Glucose, Bld 98 70 - 99 mg/dL   BUN 20 6 - 20 mg/dL   Creatinine, Ser 0.50 0.44 - 1.00 mg/dL   Calcium 9.0 8.9 - 10.3 mg/dL   Total Protein 7.7 6.5 - 8.1 g/dL   Albumin 4.2 3.5 - 5.0 g/dL   AST 24 15 - 41 U/L   ALT 23 0 - 44 U/L   Alkaline Phosphatase 88 38 - 126 U/L   Total Bilirubin 0.7 0.3 - 1.2  mg/dL   GFR calc non Af Amer >60 >60 mL/min   GFR calc Af Amer >60 >60 mL/min   Anion gap 8 5 - 15  CBC with Differential  Result Value Ref Range   WBC 4.3 4.0 - 10.5 K/uL   RBC 4.07 3.87 - 5.11 MIL/uL   Hemoglobin 12.3 12.0 - 15.0 g/dL   HCT 36.5 36.0 - 46.0 %   MCV 89.7 80.0 - 100.0 fL   MCH 30.2 26.0 - 34.0 pg   MCHC 33.7 30.0 - 36.0 g/dL   RDW 12.2 11.5 - 15.5 %   Platelets 112 (L) 150 - 400 K/uL   nRBC 0.0 0.0 - 0.2 %   Neutrophils Relative % 63 %   Neutro Abs 2.7 1.7 - 7.7 K/uL   Lymphocytes Relative 26 %   Lymphs Abs 1.1 0.7 - 4.0 K/uL   Monocytes Relative 8 %   Monocytes Absolute 0.4 0.1 - 1.0 K/uL   Eosinophils Relative 2 %   Eosinophils  Absolute 0.1 0.0 - 0.5 K/uL   Basophils Relative 0 %   Basophils Absolute 0.0 0.0 - 0.1 K/uL   Immature Granulocytes 1 %   Abs Immature Granulocytes 0.02 0.00 - 0.07 K/uL      Assessment & Plan:   Problem List Items Addressed This Visit      Digestive   Neurogenic bowel    Going to have suprapubic cath placed next week        Nervous and Auditory   MS (multiple sclerosis) ([place])    Managed by neuro      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[place])    Managed by neuro        Genitourinary   Detrusor and sphincter dyssynergia    Getting suprapubic cath next week        Other   Thrombocytopenia ([place])    Hematologist aware and has okayed use of NSAID; patient to watch for bleeding      Lymphedema    Working with therapy      History of breast cancer in female    Managed by oncologist; next mammo in June or July, ordered by onc      Abnormality of gait    Working with PT and OT on this; I approve of therapy, to help with fall prevention, improve level of independence          Follow up plan: Return in about 6 months (around 10/19/2018) for follow-up visit with Dr. Sanda Klein.  An after-visit summary was printed and given to the patient at [facility].  Please see the patient instructions which may contain other  information and recommendations beyond what is mentioned above in the assessment and plan.  Meds ordered this encounter  Medications  . fluticasone (FLONASE) 50 MCG/ACT nasal spray    Sig: Place 2 sprays into both nostrils daily.    Dispense:  16 g    Refill:  11  . ibuprofen (ADVIL,MOTRIN) 600 MG tablet    Sig: Take 1 tablet (600 mg total) by mouth every 8 (eight) hours as needed.    Dispense:  180 tablet    Refill:  1    No orders of the defined types were placed in this encounter.

## 2018-04-18 NOTE — Assessment & Plan Note (Signed)
Managed by neuro.

## 2018-04-18 NOTE — Patient Instructions (Signed)
Let your neurologist know if you have another episode of swallowing trouble Start the nasal spray Try the good filters, 61M Continue to work with the therapist and do your exercises Let me know how I can help before your appointment

## 2018-04-18 NOTE — Assessment & Plan Note (Signed)
Getting suprapubic cath next week

## 2018-04-18 NOTE — Assessment & Plan Note (Signed)
Going to have suprapubic cath placed next week

## 2018-04-19 ENCOUNTER — Encounter: Payer: Medicare Other | Admitting: Obstetrics & Gynecology

## 2018-04-19 DIAGNOSIS — G35 Multiple sclerosis: Secondary | ICD-10-CM | POA: Diagnosis not present

## 2018-04-19 DIAGNOSIS — N319 Neuromuscular dysfunction of bladder, unspecified: Secondary | ICD-10-CM | POA: Diagnosis not present

## 2018-04-19 DIAGNOSIS — I890 Lymphedema, not elsewhere classified: Secondary | ICD-10-CM | POA: Diagnosis not present

## 2018-04-19 DIAGNOSIS — K592 Neurogenic bowel, not elsewhere classified: Secondary | ICD-10-CM | POA: Diagnosis not present

## 2018-04-19 DIAGNOSIS — G40909 Epilepsy, unspecified, not intractable, without status epilepticus: Secondary | ICD-10-CM | POA: Diagnosis not present

## 2018-04-19 DIAGNOSIS — L304 Erythema intertrigo: Secondary | ICD-10-CM | POA: Diagnosis not present

## 2018-04-20 ENCOUNTER — Other Ambulatory Visit: Payer: Self-pay | Admitting: Diagnostic Neuroimaging

## 2018-04-20 DIAGNOSIS — I890 Lymphedema, not elsewhere classified: Secondary | ICD-10-CM | POA: Diagnosis not present

## 2018-04-20 DIAGNOSIS — K592 Neurogenic bowel, not elsewhere classified: Secondary | ICD-10-CM | POA: Diagnosis not present

## 2018-04-20 DIAGNOSIS — L304 Erythema intertrigo: Secondary | ICD-10-CM | POA: Diagnosis not present

## 2018-04-20 DIAGNOSIS — G40909 Epilepsy, unspecified, not intractable, without status epilepticus: Secondary | ICD-10-CM | POA: Diagnosis not present

## 2018-04-20 DIAGNOSIS — N319 Neuromuscular dysfunction of bladder, unspecified: Secondary | ICD-10-CM | POA: Diagnosis not present

## 2018-04-20 DIAGNOSIS — G35 Multiple sclerosis: Secondary | ICD-10-CM | POA: Diagnosis not present

## 2018-04-22 NOTE — Assessment & Plan Note (Signed)
Hematologist aware and has okayed use of NSAID; patient to watch for bleeding

## 2018-04-22 NOTE — Assessment & Plan Note (Signed)
Managed by oncologist; next mammo in June or July, ordered by onc

## 2018-04-22 NOTE — Assessment & Plan Note (Signed)
Working with therapy.

## 2018-04-22 NOTE — Assessment & Plan Note (Signed)
Working with PT and OT on this; I approve of therapy, to help with fall prevention, improve level of independence

## 2018-04-23 DIAGNOSIS — N319 Neuromuscular dysfunction of bladder, unspecified: Secondary | ICD-10-CM | POA: Diagnosis not present

## 2018-04-23 DIAGNOSIS — G40909 Epilepsy, unspecified, not intractable, without status epilepticus: Secondary | ICD-10-CM | POA: Diagnosis not present

## 2018-04-23 DIAGNOSIS — L304 Erythema intertrigo: Secondary | ICD-10-CM | POA: Diagnosis not present

## 2018-04-23 DIAGNOSIS — K592 Neurogenic bowel, not elsewhere classified: Secondary | ICD-10-CM | POA: Diagnosis not present

## 2018-04-23 DIAGNOSIS — I890 Lymphedema, not elsewhere classified: Secondary | ICD-10-CM | POA: Diagnosis not present

## 2018-04-23 DIAGNOSIS — G35 Multiple sclerosis: Secondary | ICD-10-CM | POA: Diagnosis not present

## 2018-04-24 ENCOUNTER — Ambulatory Visit: Payer: Medicare Other

## 2018-04-25 DIAGNOSIS — G35 Multiple sclerosis: Secondary | ICD-10-CM | POA: Diagnosis not present

## 2018-04-25 DIAGNOSIS — L304 Erythema intertrigo: Secondary | ICD-10-CM | POA: Diagnosis not present

## 2018-04-25 DIAGNOSIS — I890 Lymphedema, not elsewhere classified: Secondary | ICD-10-CM | POA: Diagnosis not present

## 2018-04-25 DIAGNOSIS — N319 Neuromuscular dysfunction of bladder, unspecified: Secondary | ICD-10-CM | POA: Diagnosis not present

## 2018-04-25 DIAGNOSIS — G40909 Epilepsy, unspecified, not intractable, without status epilepticus: Secondary | ICD-10-CM | POA: Diagnosis not present

## 2018-04-25 DIAGNOSIS — K592 Neurogenic bowel, not elsewhere classified: Secondary | ICD-10-CM | POA: Diagnosis not present

## 2018-04-27 ENCOUNTER — Telehealth: Payer: Self-pay | Admitting: Urology

## 2018-04-27 NOTE — Telephone Encounter (Signed)
Rebecca [name] was scheduled for a RUS on 05/01/2018.  We need to reschedule her ultrasound for April.

## 2018-04-27 NOTE — Telephone Encounter (Signed)
Pt called office this morning asking about rescheduling her surgery.  I don't see where she has anything scheduled except an U/S next week.  She wants to speak with you about this.

## 2018-04-29 DIAGNOSIS — K592 Neurogenic bowel, not elsewhere classified: Secondary | ICD-10-CM | POA: Diagnosis not present

## 2018-04-29 DIAGNOSIS — G40909 Epilepsy, unspecified, not intractable, without status epilepticus: Secondary | ICD-10-CM | POA: Diagnosis not present

## 2018-04-29 DIAGNOSIS — I890 Lymphedema, not elsewhere classified: Secondary | ICD-10-CM | POA: Diagnosis not present

## 2018-04-29 DIAGNOSIS — G35 Multiple sclerosis: Secondary | ICD-10-CM | POA: Diagnosis not present

## 2018-04-29 DIAGNOSIS — N319 Neuromuscular dysfunction of bladder, unspecified: Secondary | ICD-10-CM | POA: Diagnosis not present

## 2018-04-29 DIAGNOSIS — L304 Erythema intertrigo: Secondary | ICD-10-CM | POA: Diagnosis not present

## 2018-04-30 ENCOUNTER — Encounter: Payer: Medicare Other | Admitting: Obstetrics & Gynecology

## 2018-04-30 DIAGNOSIS — N319 Neuromuscular dysfunction of bladder, unspecified: Secondary | ICD-10-CM | POA: Diagnosis not present

## 2018-04-30 DIAGNOSIS — L304 Erythema intertrigo: Secondary | ICD-10-CM | POA: Diagnosis not present

## 2018-04-30 DIAGNOSIS — K592 Neurogenic bowel, not elsewhere classified: Secondary | ICD-10-CM | POA: Diagnosis not present

## 2018-04-30 DIAGNOSIS — G35 Multiple sclerosis: Secondary | ICD-10-CM | POA: Diagnosis not present

## 2018-04-30 DIAGNOSIS — G40909 Epilepsy, unspecified, not intractable, without status epilepticus: Secondary | ICD-10-CM | POA: Diagnosis not present

## 2018-04-30 DIAGNOSIS — I890 Lymphedema, not elsewhere classified: Secondary | ICD-10-CM | POA: Diagnosis not present

## 2018-05-01 ENCOUNTER — Telehealth: Payer: Self-pay | Admitting: Urology

## 2018-05-01 ENCOUNTER — Ambulatory Visit: Payer: Medicare Other

## 2018-05-01 DIAGNOSIS — L304 Erythema intertrigo: Secondary | ICD-10-CM | POA: Diagnosis not present

## 2018-05-01 DIAGNOSIS — K592 Neurogenic bowel, not elsewhere classified: Secondary | ICD-10-CM | POA: Diagnosis not present

## 2018-05-01 DIAGNOSIS — G35 Multiple sclerosis: Secondary | ICD-10-CM | POA: Diagnosis not present

## 2018-05-01 DIAGNOSIS — G40909 Epilepsy, unspecified, not intractable, without status epilepticus: Secondary | ICD-10-CM | POA: Diagnosis not present

## 2018-05-01 DIAGNOSIS — N319 Neuromuscular dysfunction of bladder, unspecified: Secondary | ICD-10-CM | POA: Diagnosis not present

## 2018-05-01 DIAGNOSIS — I890 Lymphedema, not elsewhere classified: Secondary | ICD-10-CM | POA: Diagnosis not present

## 2018-05-01 NOTE — Telephone Encounter (Signed)
Home health provider would like a call back, the  Pt's foley is leaking and she would like to know if it moved up a size. Go from 16 to 18.

## 2018-05-01 NOTE — Telephone Encounter (Signed)
Rebecca [name] is likely having bladder spasms due to the catheter.  Increasing the size of the catheter is not going to improve this.  I would make sure that the catheter is draining and no sediment or blood is in the tube.   Also, we to need to confirm if she is taking the oxybutynin XL 10 mg daily.

## 2018-05-01 NOTE — Telephone Encounter (Signed)
Elizabeth at [facility] called and asked if you could call her back to discuss a plan for Ms Suire. [phone]

## 2018-05-01 NOTE — Telephone Encounter (Signed)
I have instructed the Rebecca [name]'s to continue oxybutynin XL 10 mg BID, increase water intake, empty the overnight/leg bag three times daily.  I also explained that due to the lymphedema in her legs, she has fluid redistribution at night which would cause her kidneys to produce more urine at night resulting in leaking of urine through her urethra.  They will let us know if the up sizing to the 27F is helpful.

## 2018-05-01 NOTE — Telephone Encounter (Signed)
Spoke with Rebecca [name] and her husband regarding her urinary leaking.  The home health has changed her catheter to an 49 F.

## 2018-05-01 NOTE — Telephone Encounter (Signed)
Please advise 

## 2018-05-01 NOTE — Telephone Encounter (Signed)
Patient notified she states that she is still taking the oxybutinin and is in agreement with this plan

## 2018-05-02 DIAGNOSIS — G40909 Epilepsy, unspecified, not intractable, without status epilepticus: Secondary | ICD-10-CM | POA: Diagnosis not present

## 2018-05-02 DIAGNOSIS — L304 Erythema intertrigo: Secondary | ICD-10-CM | POA: Diagnosis not present

## 2018-05-02 DIAGNOSIS — K592 Neurogenic bowel, not elsewhere classified: Secondary | ICD-10-CM | POA: Diagnosis not present

## 2018-05-02 DIAGNOSIS — N319 Neuromuscular dysfunction of bladder, unspecified: Secondary | ICD-10-CM | POA: Diagnosis not present

## 2018-05-02 DIAGNOSIS — I890 Lymphedema, not elsewhere classified: Secondary | ICD-10-CM | POA: Diagnosis not present

## 2018-05-02 DIAGNOSIS — G35 Multiple sclerosis: Secondary | ICD-10-CM | POA: Diagnosis not present

## 2018-05-03 ENCOUNTER — Telehealth: Payer: Self-pay

## 2018-05-03 NOTE — Telephone Encounter (Signed)
Please approve orders 

## 2018-05-03 NOTE — Telephone Encounter (Signed)
Copied from [place] [id number]. Topic: Quick Communication - Home Health Verbal Orders >> May 03, 2018 12:22 PM Berneta Levins wrote: Caller/Agency: [name] with [facility] Callback Number: [phone] Requesting OT/PT/Skilled Nursing/Social Work/Speech Therapy: OT for car transfers Frequency: 2x a week for 1 week, 1x a week for 1 week

## 2018-05-04 NOTE — Telephone Encounter (Signed)
[name] with [facility] notified of approved verbal orders.

## 2018-05-07 ENCOUNTER — Ambulatory Visit: Payer: Medicare Other | Admitting: Diagnostic Neuroimaging

## 2018-05-07 ENCOUNTER — Other Ambulatory Visit: Payer: Self-pay | Admitting: Physical Medicine & Rehabilitation

## 2018-05-07 DIAGNOSIS — N319 Neuromuscular dysfunction of bladder, unspecified: Secondary | ICD-10-CM | POA: Diagnosis not present

## 2018-05-07 DIAGNOSIS — L304 Erythema intertrigo: Secondary | ICD-10-CM | POA: Diagnosis not present

## 2018-05-07 DIAGNOSIS — K592 Neurogenic bowel, not elsewhere classified: Secondary | ICD-10-CM | POA: Diagnosis not present

## 2018-05-07 DIAGNOSIS — G35 Multiple sclerosis: Secondary | ICD-10-CM | POA: Diagnosis not present

## 2018-05-07 DIAGNOSIS — I890 Lymphedema, not elsewhere classified: Secondary | ICD-10-CM | POA: Diagnosis not present

## 2018-05-07 DIAGNOSIS — G40909 Epilepsy, unspecified, not intractable, without status epilepticus: Secondary | ICD-10-CM | POA: Diagnosis not present

## 2018-05-10 ENCOUNTER — Telehealth: Payer: Self-pay | Admitting: Urology

## 2018-05-10 DIAGNOSIS — I890 Lymphedema, not elsewhere classified: Secondary | ICD-10-CM | POA: Diagnosis not present

## 2018-05-10 DIAGNOSIS — K592 Neurogenic bowel, not elsewhere classified: Secondary | ICD-10-CM | POA: Diagnosis not present

## 2018-05-10 DIAGNOSIS — G35 Multiple sclerosis: Secondary | ICD-10-CM | POA: Diagnosis not present

## 2018-05-10 DIAGNOSIS — N319 Neuromuscular dysfunction of bladder, unspecified: Secondary | ICD-10-CM | POA: Diagnosis not present

## 2018-05-10 DIAGNOSIS — G40909 Epilepsy, unspecified, not intractable, without status epilepticus: Secondary | ICD-10-CM | POA: Diagnosis not present

## 2018-05-10 DIAGNOSIS — L304 Erythema intertrigo: Secondary | ICD-10-CM | POA: Diagnosis not present

## 2018-05-10 NOTE — Telephone Encounter (Signed)
I left word with Mrs. Kinter to let us know if home health's increasing her SPT to 66f is helping with the leaking.

## 2018-05-11 DIAGNOSIS — K592 Neurogenic bowel, not elsewhere classified: Secondary | ICD-10-CM | POA: Diagnosis not present

## 2018-05-11 DIAGNOSIS — L304 Erythema intertrigo: Secondary | ICD-10-CM | POA: Diagnosis not present

## 2018-05-11 DIAGNOSIS — G40909 Epilepsy, unspecified, not intractable, without status epilepticus: Secondary | ICD-10-CM | POA: Diagnosis not present

## 2018-05-11 DIAGNOSIS — N319 Neuromuscular dysfunction of bladder, unspecified: Secondary | ICD-10-CM | POA: Diagnosis not present

## 2018-05-11 DIAGNOSIS — G35 Multiple sclerosis: Secondary | ICD-10-CM | POA: Diagnosis not present

## 2018-05-11 DIAGNOSIS — I890 Lymphedema, not elsewhere classified: Secondary | ICD-10-CM | POA: Diagnosis not present

## 2018-05-14 ENCOUNTER — Telehealth: Payer: Self-pay | Admitting: Urology

## 2018-05-14 DIAGNOSIS — L304 Erythema intertrigo: Secondary | ICD-10-CM | POA: Diagnosis not present

## 2018-05-14 DIAGNOSIS — K592 Neurogenic bowel, not elsewhere classified: Secondary | ICD-10-CM | POA: Diagnosis not present

## 2018-05-14 DIAGNOSIS — G40909 Epilepsy, unspecified, not intractable, without status epilepticus: Secondary | ICD-10-CM | POA: Diagnosis not present

## 2018-05-14 DIAGNOSIS — N319 Neuromuscular dysfunction of bladder, unspecified: Secondary | ICD-10-CM | POA: Diagnosis not present

## 2018-05-14 DIAGNOSIS — G35 Multiple sclerosis: Secondary | ICD-10-CM | POA: Diagnosis not present

## 2018-05-14 DIAGNOSIS — I890 Lymphedema, not elsewhere classified: Secondary | ICD-10-CM | POA: Diagnosis not present

## 2018-05-14 NOTE — Telephone Encounter (Signed)
Rebecca [name] states her Foley was exchanged on Friday afternoon and the particular nurse made sure that it was placed in the correct place.  She has not had any further leakage around the catheter since that particular nurse changed the catheter.  She is also having a lot of sediment, so she will reduce her Vitamin C intake to see if that helps.  Please let [facility] know.

## 2018-05-14 NOTE — Telephone Encounter (Signed)
Correction. Patient does not have a SPT.  She has a Foley.

## 2018-05-14 NOTE — Telephone Encounter (Signed)
Returned call to [place]. She states that patient is constantly calling the office reporting leakage and cath problems. She states she went out last week and exchanged cath from 18Fr to 16Fr as that was what was at the patients home. She states patient was experiencing leakage and problems with 18Fr as well as 16Fr. Husband and patient have both been educated and taught cath maintenance but according to [place] they have [facility] do it all. Advised I will pass on concerns to [facility].

## 2018-05-14 NOTE — Telephone Encounter (Signed)
Erica (from [facility]) [facility] and requests a call back about Ms Maclachlan. She did not leave any details.  Ph # [phone]

## 2018-05-14 NOTE — Telephone Encounter (Signed)
Spoke with Rebecca [name] and she states that the Foley catheter was not in the right location.  The catheter was exchanged and now she has not experienced anymore incontinence and the urine is draining into the bag.

## 2018-05-14 NOTE — Telephone Encounter (Signed)
After advising Larene Beach and she returned call to patient, seems home health was placing catheter in vagina instead. Gae Gallop at [facility]. She will pass message on to manager and ensure other staff confirm placement during future exchanges.

## 2018-05-17 DIAGNOSIS — Z79891 Long term (current) use of opiate analgesic: Secondary | ICD-10-CM | POA: Diagnosis not present

## 2018-05-17 DIAGNOSIS — G40909 Epilepsy, unspecified, not intractable, without status epilepticus: Secondary | ICD-10-CM | POA: Diagnosis not present

## 2018-05-17 DIAGNOSIS — G35 Multiple sclerosis: Secondary | ICD-10-CM | POA: Diagnosis not present

## 2018-05-17 DIAGNOSIS — G8929 Other chronic pain: Secondary | ICD-10-CM | POA: Diagnosis not present

## 2018-05-17 DIAGNOSIS — N319 Neuromuscular dysfunction of bladder, unspecified: Secondary | ICD-10-CM | POA: Diagnosis not present

## 2018-05-17 DIAGNOSIS — Z853 Personal history of malignant neoplasm of breast: Secondary | ICD-10-CM | POA: Diagnosis not present

## 2018-05-17 DIAGNOSIS — I890 Lymphedema, not elsewhere classified: Secondary | ICD-10-CM | POA: Diagnosis not present

## 2018-05-17 DIAGNOSIS — Z466 Encounter for fitting and adjustment of urinary device: Secondary | ICD-10-CM | POA: Diagnosis not present

## 2018-05-17 DIAGNOSIS — K592 Neurogenic bowel, not elsewhere classified: Secondary | ICD-10-CM | POA: Diagnosis not present

## 2018-05-17 DIAGNOSIS — Z9011 Acquired absence of right breast and nipple: Secondary | ICD-10-CM | POA: Diagnosis not present

## 2018-05-17 DIAGNOSIS — M542 Cervicalgia: Secondary | ICD-10-CM | POA: Diagnosis not present

## 2018-05-21 ENCOUNTER — Telehealth: Payer: Self-pay | Admitting: Urology

## 2018-05-21 DIAGNOSIS — K592 Neurogenic bowel, not elsewhere classified: Secondary | ICD-10-CM | POA: Diagnosis not present

## 2018-05-21 DIAGNOSIS — I890 Lymphedema, not elsewhere classified: Secondary | ICD-10-CM | POA: Diagnosis not present

## 2018-05-21 DIAGNOSIS — G40909 Epilepsy, unspecified, not intractable, without status epilepticus: Secondary | ICD-10-CM | POA: Diagnosis not present

## 2018-05-21 DIAGNOSIS — Z466 Encounter for fitting and adjustment of urinary device: Secondary | ICD-10-CM | POA: Diagnosis not present

## 2018-05-21 DIAGNOSIS — N319 Neuromuscular dysfunction of bladder, unspecified: Secondary | ICD-10-CM | POA: Diagnosis not present

## 2018-05-21 DIAGNOSIS — G35 Multiple sclerosis: Secondary | ICD-10-CM | POA: Diagnosis not present

## 2018-05-21 NOTE — Telephone Encounter (Signed)
Spoke with Danae Chen from [facility]. She is requesting VO for social worker to come out and assess situation/environment. She states the husband is refusing to perform incontinence and catheter care for patient despite teaching. Verbal order given per Larene Beach.

## 2018-05-21 NOTE — Telephone Encounter (Signed)
Rebecca [name] called from [facility] and wanted to report that the pt called and is complaining about " setiment in her tube". Rebecca [name] would like a call back ASAP. Please Advise.

## 2018-05-22 ENCOUNTER — Telehealth: Payer: Self-pay

## 2018-05-22 NOTE — Telephone Encounter (Signed)
Called patient from recall.  Reviewed options for telehealth visit.  Patient does not have a smart phone.    Virtual Visit Pre-Appointment Phone Call  Steps For Call:  1. Confirm consent - "In the setting of the current Covid19 crisis, you are scheduled for a TELEPHONE visit with your provider on 05/25/2018 at 11:20am.  Just as we do with many in-office visits, in order for you to participate in this visit, we must obtain consent.  If you'd like, I can send this to your mychart (if signed up) or email for you to review.  Otherwise, I can obtain your verbal consent now.  All virtual visits are billed to your insurance company just like a normal visit would be.  By agreeing to a virtual visit, we'd like you to understand that the technology does not allow for your provider to perform an examination, and thus may limit your provider's ability to fully assess your condition.  Finally, though the technology is pretty good, we cannot assure that it will always work on either your or our end, and in the setting of a video visit, we may have to convert it to a phone-only visit.  In either situation, we cannot ensure that we have a secure connection.  Are you willing to proceed?" STAFF: Did the patient verbally acknowledge consent to telehealth visit? YES  2. Confirm the BEST phone number to call the day of the visit by including in appointment notes YES  3. Give patient instructions for WebEx/MyChart download to smartphone as below or Doximity/Doxy.me if video visit (depending on what platform provider is using)  4. Advise patient to be prepared with their blood pressure, heart rate, weight, any heart rhythm information, their current medicines, and a piece of paper and pen handy for any instructions they may receive the day of their visit  5. Inform patient they will receive a phone call 15 minutes prior to their appointment time (may be from unknown caller ID) so they should be prepared to answer  6.  Confirm that appointment type is correct in Epic appointment notes (VIDEO vs PHONE)     TELEPHONE CALL NOTE  Rebecca [name] has been deemed a candidate for a follow-up tele-health visit to limit community exposure during the Covid-19 pandemic. I spoke with the patient via phone to ensure availability of phone/video source, confirm preferred email & phone number, and discuss instructions and expectations.  I reminded Rebecca [name] to be prepared with any vital sign and/or heart rhythm information that could potentially be obtained via home monitoring, at the time of her visit. I reminded Rebecca [name] to expect a phone call at the time of her visit if her visit.  Janan Ridge, [place] 05/22/2018 3:32 PM   INSTRUCTIONS FOR DOWNLOADING THE [place] APP TO SMARTPHONE  - If Apple, ask patient to go to [employer] and type in WebEx in the search bar. [place] [employer], the blue/green circle. If Android, go to [employer] and type in [employer] in the search bar. The app is free but as with any other app downloads, their phone may require them to verify saved payment information or Apple/Android password.  - The patient does NOT have to create an account. - On the day of the visit, the assist will walk the patient through joining the meeting with the meeting number/password.  INSTRUCTIONS FOR DOWNLOADING THE MYCHART APP TO SMARTPHONE  - If Apple, go to [employer] and type in  MyChart in the search bar and download the app. If Android, ask patient to go to [employer] and type in [place] in the search bar and download the app. The app is free but as with any other app downloads, their phone may require them to verify saved payment information or Apple/Android password.  - The patient will need to then log into the app with their MyChart username and password, and select [facility] as their healthcare provider to link the account. When it is time for your visit, go to the MyChart app,  find appointments, and click Begin Video Visit. Be sure to Select Allow for your device to access the Microphone and Camera for your visit. You will then be connected, and your provider will be with you shortly.  **If they have any issues connecting, or need assistance please contact MyChart service desk [phone])**  **If using a computer, in order to ensure the best quality for their visit they will need to use either of the following Internet Browsers: [employer], or Google Chrome**  IF USING DOXIMITY or DOXY.ME - The patient will receive a link just prior to their visit, either by text or email (to be determined day of appointment depending on if it's doxy.me or Doximity).     FULL LENGTH CONSENT FOR TELE-HEALTH VISIT   I hereby voluntarily request, consent and authorize [facility] and its employed or contracted physicians, physician assistants, nurse practitioners or other licensed health care professionals (the Practitioner), to provide me with telemedicine health care services (the "Services") as deemed necessary by the treating Practitioner. I acknowledge and consent to receive the Services by the Practitioner via telemedicine. I understand that the telemedicine visit will involve communicating with the Practitioner through live audiovisual communication technology and the disclosure of certain medical information by electronic transmission. I acknowledge that I have been given the opportunity to request an in-person assessment or other available alternative prior to the telemedicine visit and am voluntarily participating in the telemedicine visit.  I understand that I have the right to withhold or withdraw my consent to the use of telemedicine in the course of my care at any time, without affecting my right to future care or treatment, and that the Practitioner or I may terminate the telemedicine visit at any time. I understand that I have the right to inspect all  information obtained and/or recorded in the course of the telemedicine visit and may receive copies of available information for a reasonable fee.  I understand that some of the potential risks of receiving the Services via telemedicine include:  Marland Kitchen Delay or interruption in medical evaluation due to technological equipment failure or disruption; . Information transmitted may not be sufficient (e.g. poor resolution of images) to allow for appropriate medical decision making by the Practitioner; and/or  . In rare instances, security protocols could fail, causing a breach of personal health information.  Furthermore, I acknowledge that it is my responsibility to provide information about my medical history, conditions and care that is complete and accurate to the best of my ability. I acknowledge that Practitioner's advice, recommendations, and/or decision may be based on factors not within their control, such as incomplete or inaccurate data provided by me or distortions of diagnostic images or specimens that may result from electronic transmissions. I understand that the practice of medicine is not an exact science and that Practitioner makes no warranties or guarantees regarding treatment outcomes. I acknowledge that I will receive a copy  of this consent concurrently upon execution via email to the email address I last provided but may also request a printed copy by calling the office of [facility].    I understand that my insurance will be billed for this visit.   I have read or had this consent read to me. . I understand the contents of this consent, which adequately explains the benefits and risks of the Services being provided via telemedicine.  . I have been provided ample opportunity to ask questions regarding this consent and the Services and have had my questions answered to my satisfaction. . I give my informed consent for the services to be provided through the use of telemedicine in my  medical care  By participating in this telemedicine visit I agree to the above.

## 2018-05-23 ENCOUNTER — Encounter: Payer: Medicare Other | Attending: Physical Medicine & Rehabilitation | Admitting: Physical Medicine & Rehabilitation

## 2018-05-23 ENCOUNTER — Encounter: Payer: Self-pay | Admitting: Physical Medicine & Rehabilitation

## 2018-05-23 ENCOUNTER — Other Ambulatory Visit: Payer: Self-pay

## 2018-05-23 DIAGNOSIS — R269 Unspecified abnormalities of gait and mobility: Secondary | ICD-10-CM

## 2018-05-23 DIAGNOSIS — N319 Neuromuscular dysfunction of bladder, unspecified: Secondary | ICD-10-CM

## 2018-05-23 DIAGNOSIS — G35 Multiple sclerosis: Secondary | ICD-10-CM | POA: Diagnosis not present

## 2018-05-23 DIAGNOSIS — N3644 Muscular disorders of urethra: Secondary | ICD-10-CM

## 2018-05-23 DIAGNOSIS — G8220 Paraplegia, unspecified: Secondary | ICD-10-CM

## 2018-05-23 DIAGNOSIS — I890 Lymphedema, not elsewhere classified: Secondary | ICD-10-CM

## 2018-05-23 DIAGNOSIS — G801 Spastic diplegic cerebral palsy: Secondary | ICD-10-CM

## 2018-05-23 NOTE — Progress Notes (Signed)
Subjective:    Patient ID: Rebecca [name], female    DOB: 1960-03-22, 58 y.o.   MRN: [id number]  TELEHEALTH NOTE  Due to national recommendations of social distancing due to COVID 19, an audio/video telehealth visit is felt to be most appropriate for this patient at this time.  See Chart message from today for the patient's consent to telehealth from Rebecca [name].     I verified that I am speaking with the correct person using two identifiers.  Location of patient: Home Location of provider: Office Method of communication: Telephone Names of participants : Zorita Pang scheduling, Wenda Overland obtaining consent and vitals if available Established patient Time spent on call: 17 minutes   HPI  Female, history of chronic pain, lymphedema bilateral lower extremities, right breast cancer, chemotherapy, seizure disorder, multiple sclerosis presents for follow up for MS.  Last clinic visit 11/17/2017.  Since that time, she went to the ED on several occasions due to worsening lymphedema, UTI, weakness, obstructed Foley, yeast infection, dislodged Foley.  Notes reviewed.  Since that time, patient states she is scheduled to have suprapubic cath next week, currently with foley. She completed therapies last week. She continues to follow with Neurology and undergoing medication adjustments. She continues to take Baclofen and Tizanidine with benefit. Therapies did not feel Botulinum toxin was appropriate at this time. Lymphedema has improve per patient.  Denies falls for several months.  Pain Inventory Average Pain 0 Pain Right Now 0 My pain is intermittent, tingling and aching  In the last 24 hours, has pain interfered with the following? General activity 0 Relation with others 0 Enjoyment of life 0 What TIME of day is your pain at its worst? no pain, night Sleep (in general) Good  Pain is worse with: no pain Pain improves with: no pain Relief from Meds:  no pain  Mobility walk with assistance use a walker how many minutes can you walk? 15 ability to climb steps?  no do you drive?  no use a wheelchair transfers alone Do you have any goals in this area?  yes  Function disabled: date disabled . I need assistance with the following:  dressing, bathing, toileting, meal prep, household duties and shopping Do you have any goals in this area?  yes  Neuro/Psych bladder control problems weakness numbness tingling trouble walking  Prior Studies Any changes since last visit?  yes  Has been hospitalized and to ED since last visit for UTI's  Currently has a foley and is scheduled for surgery on bladder next week if not changed due to [place] 19  Physicians involved in your care Any changes since last visit?  no   Family History  Problem Relation Age of Onset  . Cancer Father        skin  . Heart disease Father   . Heart attack Father        heart attack in his 92's  . Thyroid disease Sister   . Ovarian cancer Cousin   . Breast cancer Maternal Aunt 60  . Breast cancer Maternal Grandmother 24  . Bladder Cancer Neg Hx   . Kidney cancer Neg Hx    Social History   Socioeconomic History  . Marital status: Married    Spouse name: Rebecca [name]  . Number of children: 0  . Years of education: 80  . Highest education level: Not on file  Occupational History  . Occupation: disability  Social Needs  . Emergency planning/management officer  strain: Not on file  . Food insecurity:    Worry: Not on file    Inability: Not on file  . Transportation needs:    Medical: Not on file    Non-medical: Not on file  Tobacco Use  . Smoking status: Never Smoker  . Smokeless tobacco: Never Used  Substance and Sexual Activity  . Alcohol use: No  . Drug use: No  . Sexual activity: Not Currently    Partners: Male  Lifestyle  . Physical activity:    Days per week: Not on file    Minutes per session: Not on file  . Stress: Not on file  Relationships  . Social  connections:    Talks on phone: Not on file    Gets together: Not on file    Attends religious service: Not on file    Active member of club or organization: Not on file    Attends meetings of clubs or organizations: Not on file    Relationship status: Not on file  Other Topics Concern  . Not on file  Social History Narrative   She is married. Recently moved back to [place] after being in [place] for some time. She is accompanied by her husband and aunt.   Never smoked. Never used alcohol.   Past Surgical History:  Procedure Laterality Date  . ANKLE SURGERY     Left  . ANKLE SURGERY    . ANTERIOR CERVICAL DECOMP/DISCECTOMY FUSION  11/17/2011   Procedure: ANTERIOR CERVICAL DECOMPRESSION/DISCECTOMY FUSION 2 LEVELS;  Surgeon: Erline Levine, MD;  Location: [facility];  Service: Neurosurgery;  Laterality: N/A;  Cervical Five-Six Six-Seven Anterior cervical decompression/diskectomy/fusion  . BREAST BIOPSY Right 12-31-13   invasive mammary  . BREAST SURGERY Right 02/03/2014   Right simple mastectomy with sentinel node biopsy.  . CHOLECYSTECTOMY    . COLONOSCOPY  2014  . Lower extremity venous Dopplers  Feb 27, 2013   No LE DVT  . MASTECTOMY Right 2015  . Port a cath insertion Right 01/19/2010  . PORT-A-CATH REMOVAL     right  . PORT-A-CATH REMOVAL Right 09/03/2013   Procedure: REMOVAL PORT-A-CATH;  Surgeon: Conrad Como, MD;  Location: [facility];  Service: Vascular;  Laterality: Right;  . TRANSTHORACIC ECHOCARDIOGRAM  03/2013; 02/2014   a) Normal LV size and function with EF 60-65%.; Cannot exclude bicuspid aortic valve with mild AS and mild AI.; b) Normal EF with normal wall motion and valve function x Mild MR. G2 DD. EF 60-65%. Tricuspid AoV  . UPPER GI ENDOSCOPY  2014   Past Medical History:  Diagnosis Date  . Aortic valve disease    Mild AS / AI - most recent Echo demonstrated tricuspid aortic valve.  . Bacterial endocarditis    History of .  Marland Kitchen Bilateral lower extremity  edema    Noncardiac.  Chronic. LE Venous dopplers - negative for DVT.; Echocardiogram January 2016: Normal EF with normal wall motion and valve function. Only grade 1 diastolic dysfunction. EF 60-65%. Mild MR  . Breast cancer ([place]) 12-31-13   Right breast, 12:00, 1.5 cm, T1c,N0 invasive mammary carcinoma, triple negative. --> Rx with Chemo  . Cervical stenosis of spine   . Herpes zoster   . IBS (irritable bowel syndrome)   . Lymphedema    has legs wrapped at [facility]  . Multiple sclerosis ([place]) 2001   Walks from room to room @ home; but Wheelchair when going out.  Marland Kitchen Neuromuscular disorder ([place])  MS  . Seizures ([place])    Takes Keppra  . Syncope and collapse    There were no vitals taken for this visit.  Opioid Risk Score:   Fall Risk Score:  `1  Depression screen PHQ 2/9  Depression screen [facility] 2/9 05/23/2018 04/18/2018 12/26/2017 09/08/2017 06/23/2017 02/09/2017 10/28/2016  Decreased Interest 0 0 0 0 0 0 -  Down, Depressed, Hopeless 0 0 0 0 0 0 0  PHQ - 2 Score 0 0 0 0 0 0 0  Altered sleeping - 0 0 - - - -  Tired, decreased energy - 0 0 - - - -  Change in appetite - 0 0 - - - -  Feeling bad or failure about yourself  - 0 0 - - - -  Trouble concentrating - 0 0 - - - -  Moving slowly or fidgety/restless - 0 0 - - - -  Suicidal thoughts - 0 0 - - - -  PHQ-9 Score - 0 0 - - - -  Difficult doing work/chores - Not difficult at all Not difficult at all - - - -  Some recent data might be hidden    Review of Systems  HENT: Negative.   Eyes: Negative.   Respiratory: Negative.   Cardiovascular: Positive for leg swelling.  Gastrointestinal: Negative.   Endocrine: Negative.   Genitourinary:       Has foley currently and has experienced UTIs since last seen  Musculoskeletal: Positive for arthralgias and gait problem.  Skin: Negative.   Allergic/Immunologic: Negative.   Neurological: Positive for weakness and numbness.       Tingling  Hematological: Negative.   Psychiatric/Behavioral:  Negative.   All other systems reviewed and are negative.      Objective:   Physical Exam Gen: NAD. Pulm: Effort normal Neuro: Alert and oriented Psych: pleasant and cooperative    Assessment & Plan:  58 year old right-handed female, history of chronic pain, lymphedema bilateral lower extremities, right breast cancer, chemotherapy, seizure disorder, multiple sclerosis presents for follow up for MS.   1. MS   Cont HEP, completed therapies  Cont meds   Cont follow up with Neurology  2.  Spastic paraparesis:   Cont ROM  Cont Baclofen  Cont tizanidine 2mg  BID PRN  Good benefit with combination  No need for Botox at this time, confirmed with therapies.   3. Chronic lymphedema  Encouraged wraps and massage  Cont pumps and elevation  States improving  4. Gait abnormality  Cont HEP  Cont walker/wheelchair for safety  5. Neurogenic bladder with spastic bladder  With recurrent UTIs  Cont meds  Follow up Urology, plans for suprapubic cath next week, currently with foley  Encouraged discussed with Urology need for Ditropan with foley

## 2018-05-24 DIAGNOSIS — N319 Neuromuscular dysfunction of bladder, unspecified: Secondary | ICD-10-CM | POA: Diagnosis not present

## 2018-05-24 DIAGNOSIS — K592 Neurogenic bowel, not elsewhere classified: Secondary | ICD-10-CM | POA: Diagnosis not present

## 2018-05-24 DIAGNOSIS — I890 Lymphedema, not elsewhere classified: Secondary | ICD-10-CM | POA: Diagnosis not present

## 2018-05-24 DIAGNOSIS — G35 Multiple sclerosis: Secondary | ICD-10-CM | POA: Diagnosis not present

## 2018-05-24 DIAGNOSIS — Z466 Encounter for fitting and adjustment of urinary device: Secondary | ICD-10-CM | POA: Diagnosis not present

## 2018-05-24 DIAGNOSIS — G40909 Epilepsy, unspecified, not intractable, without status epilepticus: Secondary | ICD-10-CM | POA: Diagnosis not present

## 2018-05-25 ENCOUNTER — Telehealth (INDEPENDENT_AMBULATORY_CARE_PROVIDER_SITE_OTHER): Payer: Medicare Other | Admitting: Cardiovascular Disease

## 2018-05-25 ENCOUNTER — Other Ambulatory Visit: Payer: Self-pay

## 2018-05-25 DIAGNOSIS — R0602 Shortness of breath: Secondary | ICD-10-CM | POA: Diagnosis not present

## 2018-05-25 DIAGNOSIS — G35 Multiple sclerosis: Secondary | ICD-10-CM

## 2018-05-25 DIAGNOSIS — N39490 Overflow incontinence: Secondary | ICD-10-CM

## 2018-05-25 DIAGNOSIS — M79604 Pain in right leg: Secondary | ICD-10-CM

## 2018-05-25 DIAGNOSIS — G40909 Epilepsy, unspecified, not intractable, without status epilepticus: Secondary | ICD-10-CM | POA: Diagnosis not present

## 2018-05-25 DIAGNOSIS — I890 Lymphedema, not elsewhere classified: Secondary | ICD-10-CM | POA: Diagnosis not present

## 2018-05-25 DIAGNOSIS — M79605 Pain in left leg: Secondary | ICD-10-CM

## 2018-05-25 NOTE — Patient Instructions (Signed)

## 2018-05-25 NOTE — Progress Notes (Signed)
Virtual Visit via Telephone Note   This visit type was conducted due to national recommendations for restrictions regarding the COVID-19 Pandemic (e.g. social distancing) in an effort to limit this patient's exposure and mitigate transmission in our community.  Due to her co-morbid illnesses, this patient is at least at moderate risk for complications without adequate follow up.  This format is felt to be most appropriate for this patient at this time.  The patient did not have access to video technology/had technical difficulties with video requiring transitioning to audio format only (telephone).  All issues noted in this document were discussed and addressed.  No physical exam could be performed with this format.  Please refer to the patient's chart for her  consent to telehealth for [facility].   I connected with  Rebecca [name] on 05/25/18 by a video enabled telemedicine application and verified that I am speaking with the correct person using two identifiers. I discussed the limitations of evaluation and management by telemedicine. The patient expressed understanding and agreed to proceed.   Evaluation Performed:  Follow-up visit  Date:  05/25/2018   ID:  Rebecca [name], DOB 1960/11/21, MRN [id number]  Patient Location:  [place] [address]    Provider location:   [facility], [place] office  PCP:  [name], MD  Cardiologist:  Patsy Baltimore   Chief Complaint:  Leg weakness, falls, UTI    History of Present Illness:    Rebecca [name] is a 58 y.o. female who presents via audio/video conferencing for a telehealth visit today.   The patient does not symptoms concerning for COVID-19 infection (fever, chills, cough, or new SHORTNESS OF BREATH).   Patient has a past medical history of chronic LE edema (mostly related to Lymphedema).  multiple sclerosis with muscle atrophy of lower extremities.  Uses a walker secondary to leg  weakness, as well as a wheelchair uses Unaboot wraps for mechanical compression since 05/2016 She has compression pumps but does not use these very much breast cancer and had a right mastectomy with lymph node dissection.  She completed chemotherapy , no XRT Who presents for follow-up of her leg swelling  UTI in dec 2019 Having falls Long term foley, indwelling Scheduled for suprapublic cath Doing PT, OT  MRI brain: Stable extensive white matter hyperintensities compatible with history of multiple sclerosis. No new lesion or enhancement identified to suggest an active process.  MRI spinal 1. Stable T12 cord lesion likely representing demyelinating plaque. No abnormal enhancement. 2. Diminution of left L5-S1 subarticular disc protrusion. Otherwise stable mild lumbar spine spondylosis. No significant foraminal or canal stenosis.  Vitals 127/75, Pulse 61 resp 16 Weight 214, (up from 170 pounds in 2018)  Labs: Potassium 3.6 CR 0.5  Denies any sores or skin breakdown, no ulcerations or blistering of the lower extremities No regular exercise program  Continues to have leg wraps placed once per week since April 2018 Has lymphedema pump, uses  Once or twice a week Does exercises as   Prior CV studies:   The following studies were reviewed today:  Previous studies reviewed with her Echocardiogram 03/2016 Normal LV function Mild AI, MR, borderline elevated right heart pressures No change from previous echo  Lab work reviewed Total cholesterol 214   Past Medical History:  Diagnosis Date   Aortic valve disease    Mild AS / AI - most recent Echo demonstrated tricuspid aortic valve.  Bacterial endocarditis    History of .   Bilateral lower extremity edema    Noncardiac.  Chronic. LE Venous dopplers - negative for DVT.; Echocardiogram January 2016: Normal EF with normal wall motion and valve function. Only grade 1 diastolic dysfunction. EF 60-65%. Mild MR   Breast  cancer ([place]) 12-31-13   Right breast, 12:00, 1.5 cm, T1c,N0 invasive mammary carcinoma, triple negative. --> Rx with Chemo   Cervical stenosis of spine    Herpes zoster    IBS (irritable bowel syndrome)    Lymphedema    has legs wrapped at [facility]   Multiple sclerosis ([place]) 2001   Walks from room to room @ home; but Wheelchair when going out.   Neuromuscular disorder ([place])    MS   Seizures ([place])    Takes Keppra   Syncope and collapse    Past Surgical History:  Procedure Laterality Date   ANKLE SURGERY     Left   ANKLE SURGERY     ANTERIOR CERVICAL DECOMP/DISCECTOMY FUSION  11/17/2011   Procedure: ANTERIOR CERVICAL DECOMPRESSION/DISCECTOMY FUSION 2 LEVELS;  Surgeon: Erline Levine, MD;  Location: [facility];  Service: Neurosurgery;  Laterality: N/A;  Cervical Five-Six Six-Seven Anterior cervical decompression/diskectomy/fusion   BREAST BIOPSY Right 12-31-13   invasive mammary   BREAST SURGERY Right 02/03/2014   Right simple mastectomy with sentinel node biopsy.   CHOLECYSTECTOMY     COLONOSCOPY  2014   Lower extremity venous Dopplers  Feb 27, 2013   No LE DVT   MASTECTOMY Right 2015   Port a cath insertion Right 01/19/2010   PORT-A-CATH REMOVAL     right   PORT-A-CATH REMOVAL Right 09/03/2013   Procedure: REMOVAL PORT-A-CATH;  Surgeon: Conrad Wintersburg, MD;  Location: [facility];  Service: Vascular;  Laterality: Right;   TRANSTHORACIC ECHOCARDIOGRAM  03/2013; 02/2014   a) Normal LV size and function with EF 60-65%.; Cannot exclude bicuspid aortic valve with mild AS and mild AI.; b) Normal EF with normal wall motion and valve function x Mild MR. G2 DD. EF 60-65%. Tricuspid AoV   UPPER GI ENDOSCOPY  2014     No outpatient medications have been marked as taking for the 05/25/18 encounter (Appointment) with Minna Merritts, MD.     Allergies:   Fentanyl and Sulfa antibiotics   Social History   Tobacco Use   Smoking status: Never Smoker   Smokeless tobacco: Never  Used  Substance Use Topics   Alcohol use: No   Drug use: No     Current Outpatient Medications on File Prior to Visit  Medication Sig Dispense Refill   amantadine (SYMMETREL) 100 MG capsule Take 1 capsule (100 mg total) by mouth 3 (three) times daily. 270 capsule 4   baclofen (LIORESAL) 10 MG tablet TAKE 1 TO 2 TABLETS BY MOUTH 3 TIMES A DAY 540 tablet 2   Disposable Gloves (ASSURANCE VINYL EXAM GLOVES) MISC Neurogenic bladder; MS; LON 99 months; for use for personal hygiene 50 each 11   fluticasone (FLONASE) 50 MCG/ACT nasal spray Place 2 sprays into both nostrils daily. (Patient not taking: Reported on 05/23/2018) 16 g 11   glucosamine-chondroitin 500-400 MG tablet Take 1 tablet by mouth 4 (four) times daily.     ibuprofen (ADVIL,MOTRIN) 600 MG tablet Take 1 tablet (600 mg total) by mouth every 8 (eight) hours as needed. 180 tablet 1   Interferon Beta-1a (REBIF REBIDOSE) 44 MCG/0.5ML SOAJ Inject 0.5 mLs into the skin every Monday, Wednesday, and Friday.  12 Syringe 11   levETIRAcetam (KEPPRA) 500 MG tablet Take 1 tablet (500 mg total) by mouth 2 (two) times daily. 180 tablet 4   loratadine (CLARITIN) 10 MG tablet Take 10 mg by mouth daily.     Misc Natural Products (LEG VEIN & CIRCULATION) TABS Take 1 tablet by mouth as directed.     Multiple Vitamins-Minerals (HAIR SKIN AND NAILS FORMULA) TABS Take 1 tablet by mouth daily.      Multiple Vitamins-Minerals (MULTIVITAMIN PO) Take 1 tablet by mouth daily.      Multiple Vitamins-Minerals (PRESERVISION AREDS 2) CAPS Take 1 capsule by mouth 2 (two) times daily.     nystatin (MYCOSTATIN/NYSTOP) powder Apply topically 3 (three) times daily. (Patient not taking: Reported on 05/23/2018) 45 g 0   oxybutynin (DITROPAN-XL) 10 MG 24 hr tablet TAKE 2 TABLETS BY MOUTH EVERY DAY 180 tablet 3   tiZANidine (ZANAFLEX) 4 MG tablet TAKE 1/2 TABLETS (2 MG TOTAL) BY MOUTH 2 (TWO) TIMES DAILY AS NEEDED (SPASTICITY UNCONTROLLED). 90 tablet 0    Turmeric 500 MG CAPS Take 500 mg by mouth daily.     vitamin C (ASCORBIC ACID) 500 MG tablet Take 500 mg by mouth daily.     No current facility-administered medications on file prior to visit.      Family Hx: The patient's family history includes Breast cancer (age of onset: 22) in her maternal aunt; Breast cancer (age of onset: 21) in her maternal grandmother; Cancer in her father; Heart attack in her father; Heart disease in her father; Ovarian cancer in her cousin; Thyroid disease in her sister. There is no history of Bladder Cancer or Kidney cancer.  ROS:   Please see the history of present illness.    Review of Systems  Constitutional: Negative.   Respiratory: Negative.   Cardiovascular: Negative.   Gastrointestinal: Negative.   Musculoskeletal: Positive for falls.       Leg weakness  Neurological: Negative.   Psychiatric/Behavioral: Negative.   All other systems reviewed and are negative.     Labs/Other Tests and Data Reviewed:    Recent Labs: 04/10/2018: ALT 23; BUN 20; Creatinine, Ser 0.50; Hemoglobin 12.3; Platelets 112; Potassium 3.6; Sodium 138   Recent Lipid Panel Lab Results  Component Value Date/Time   CHOL 214 (H) 11/25/2015 12:16 PM   TRIG 109 11/25/2015 12:16 PM   HDL 58 11/25/2015 12:16 PM   CHOLHDL 3.7 11/25/2015 12:16 PM   LDLCALC 134 (H) 11/25/2015 12:16 PM    Wt Readings from Last 3 Encounters:  03/26/18 217 lb (98.4 kg)  03/02/18 217 lb (98.4 kg)  02/26/18 217 lb (98.4 kg)     Exam:    Vital Signs: Vital signs may also be detailed in the HPI There were no vitals taken for this visit.  Wt Readings from Last 3 Encounters:  03/26/18 217 lb (98.4 kg)  03/02/18 217 lb (98.4 kg)  02/26/18 217 lb (98.4 kg)   Temp Readings from Last 3 Encounters:  04/18/18 (!) 97.5 F (36.4 C) (Oral)  04/10/18 (!) 97.4 F (36.3 C)  03/26/18 98.4 F (36.9 C) (Oral)   BP Readings from Last 3 Encounters:  04/18/18 128/78  04/10/18 (!) 142/86  03/27/18  137/81   Pulse Readings from Last 3 Encounters:  04/18/18 62  04/10/18 64  03/27/18 67    127/75, Pulse 61 resp 16  Well nourished, well developed female in no acute distress. Constitutional:  oriented to person, place, and time. No distress.  ASSESSMENT & PLAN:    Bilateral lower extremity edema /lymphedema Uses lymphedema compression pump, but only uses 1-2 x per week Seems satisfied,  Does not use wraps,  does exercises  SOB (shortness of breath) - Plan: EKG 12-Lead Stable sx, exacerbated by MS Weight up from 170 in 2018, now 214  MS (multiple sclerosis) ([place]) - Plan: EKG 12-Lead Reports her symptoms are stable  Reviewed imaging MS meds (interferon)  exacerbating UTIs  Chronic UTIs Exacerbating leg weakness,  Chronic indwelling foley    COVID-19 Education: The signs and symptoms of COVID-19 were discussed with the patient and how to seek care for testing (follow up with PCP or arrange E-visit).  The importance of social distancing was discussed today.  Patient Risk:   After full review of this patients clinical status, I feel that they are at least moderate risk at this time.  Time:   Today, I have spent 25 minutes with the patient with telehealth technology discussing the cardiac and medical problems/diagnoses detailed above   10 min spent reviewing the chart prior to patient visit today   Medication Adjustments/Labs and Tests Ordered: Current medicines are reviewed at length with the patient today.  Concerns regarding medicines are outlined above.   Tests Ordered: No tests ordered   Medication Changes: No changes made   Disposition: Follow-up as needed   Signed, Ida Rogue, MD  05/25/2018 11:29 AM    [place] Office [address] [place] [address], [place], [place] [address]

## 2018-05-28 ENCOUNTER — Other Ambulatory Visit: Payer: Self-pay

## 2018-05-28 DIAGNOSIS — K592 Neurogenic bowel, not elsewhere classified: Secondary | ICD-10-CM | POA: Diagnosis not present

## 2018-05-28 DIAGNOSIS — N319 Neuromuscular dysfunction of bladder, unspecified: Secondary | ICD-10-CM | POA: Diagnosis not present

## 2018-05-28 DIAGNOSIS — G40909 Epilepsy, unspecified, not intractable, without status epilepticus: Secondary | ICD-10-CM | POA: Diagnosis not present

## 2018-05-28 DIAGNOSIS — I890 Lymphedema, not elsewhere classified: Secondary | ICD-10-CM | POA: Diagnosis not present

## 2018-05-28 DIAGNOSIS — G35 Multiple sclerosis: Secondary | ICD-10-CM | POA: Diagnosis not present

## 2018-05-28 DIAGNOSIS — Z466 Encounter for fitting and adjustment of urinary device: Secondary | ICD-10-CM | POA: Diagnosis not present

## 2018-05-29 ENCOUNTER — Other Ambulatory Visit: Payer: Self-pay

## 2018-05-29 ENCOUNTER — Inpatient Hospital Stay: Payer: Medicare Other | Attending: Oncology

## 2018-05-29 DIAGNOSIS — Z853 Personal history of malignant neoplasm of breast: Secondary | ICD-10-CM | POA: Insufficient documentation

## 2018-05-29 DIAGNOSIS — Z95828 Presence of other vascular implants and grafts: Secondary | ICD-10-CM

## 2018-05-29 DIAGNOSIS — Z452 Encounter for adjustment and management of vascular access device: Secondary | ICD-10-CM | POA: Diagnosis not present

## 2018-05-29 MED ORDER — SODIUM CHLORIDE 0.9% FLUSH
10.0000 mL | Freq: Once | INTRAVENOUS | Status: AC
  Administered 2018-05-29: 10 mL via INTRAVENOUS
  Filled 2018-05-29: qty 10

## 2018-05-29 MED ORDER — HEPARIN SOD (PORK) LOCK FLUSH 100 UNIT/ML IV SOLN
500.0000 [IU] | Freq: Once | INTRAVENOUS | Status: AC
  Administered 2018-05-29: 500 [IU] via INTRAVENOUS

## 2018-05-30 ENCOUNTER — Telehealth: Payer: Self-pay

## 2018-05-30 DIAGNOSIS — Z466 Encounter for fitting and adjustment of urinary device: Secondary | ICD-10-CM | POA: Diagnosis not present

## 2018-05-30 DIAGNOSIS — N319 Neuromuscular dysfunction of bladder, unspecified: Secondary | ICD-10-CM | POA: Diagnosis not present

## 2018-05-30 DIAGNOSIS — G35 Multiple sclerosis: Secondary | ICD-10-CM | POA: Diagnosis not present

## 2018-05-30 DIAGNOSIS — I890 Lymphedema, not elsewhere classified: Secondary | ICD-10-CM | POA: Diagnosis not present

## 2018-05-30 DIAGNOSIS — K592 Neurogenic bowel, not elsewhere classified: Secondary | ICD-10-CM | POA: Diagnosis not present

## 2018-05-30 DIAGNOSIS — G40909 Epilepsy, unspecified, not intractable, without status epilepticus: Secondary | ICD-10-CM | POA: Diagnosis not present

## 2018-05-30 NOTE — Telephone Encounter (Signed)
Info in chart

## 2018-05-30 NOTE — Telephone Encounter (Signed)
Copied from [place] [id number]. Topic: General - Inquiry >> May 30, 2018  2:48 PM Scherrie Gerlach wrote: Reason for CRM: pt states she is no longer with [facility] for her in home health care, and  [facility] came out today, and she would like Dr Sanda Klein know this is who she wants. [facility] said that would make their paperwork easier. The name of the company is [employer].

## 2018-05-31 ENCOUNTER — Encounter: Payer: Self-pay | Admitting: Family Medicine

## 2018-05-31 ENCOUNTER — Encounter: Payer: Self-pay | Admitting: Nurse Practitioner

## 2018-06-01 ENCOUNTER — Ambulatory Visit
Admission: RE | Admit: 2018-06-01 | Discharge: 2018-06-01 | Disposition: A | Discharge status: Home / Self Care | Payer: Medicare Other | Source: Ambulatory Visit | Attending: Urology | Admitting: Urology

## 2018-06-01 ENCOUNTER — Other Ambulatory Visit: Payer: Self-pay

## 2018-06-01 DIAGNOSIS — N390 Urinary tract infection, site not specified: Secondary | ICD-10-CM

## 2018-06-01 DIAGNOSIS — N200 Calculus of kidney: Secondary | ICD-10-CM | POA: Diagnosis not present

## 2018-06-04 ENCOUNTER — Telehealth: Payer: Self-pay | Admitting: *Deleted

## 2018-06-04 NOTE — Telephone Encounter (Signed)
Called patient who had agreed to tele visit. We updated her EMR, and she  verbalized understanding, appreciation.

## 2018-06-05 ENCOUNTER — Ambulatory Visit (INDEPENDENT_AMBULATORY_CARE_PROVIDER_SITE_OTHER): Payer: Medicare Other | Admitting: Diagnostic Neuroimaging

## 2018-06-05 ENCOUNTER — Other Ambulatory Visit: Payer: Self-pay

## 2018-06-05 ENCOUNTER — Encounter: Payer: Medicare Other | Admitting: Obstetrics & Gynecology

## 2018-06-05 DIAGNOSIS — K592 Neurogenic bowel, not elsewhere classified: Secondary | ICD-10-CM | POA: Diagnosis not present

## 2018-06-05 DIAGNOSIS — N319 Neuromuscular dysfunction of bladder, unspecified: Secondary | ICD-10-CM | POA: Diagnosis not present

## 2018-06-05 DIAGNOSIS — M62838 Other muscle spasm: Secondary | ICD-10-CM

## 2018-06-05 DIAGNOSIS — I890 Lymphedema, not elsewhere classified: Secondary | ICD-10-CM | POA: Diagnosis not present

## 2018-06-05 DIAGNOSIS — G35 Multiple sclerosis: Secondary | ICD-10-CM | POA: Diagnosis not present

## 2018-06-05 DIAGNOSIS — Z466 Encounter for fitting and adjustment of urinary device: Secondary | ICD-10-CM | POA: Diagnosis not present

## 2018-06-05 DIAGNOSIS — G40909 Epilepsy, unspecified, not intractable, without status epilepticus: Secondary | ICD-10-CM

## 2018-06-05 MED ORDER — AMANTADINE HCL 100 MG PO CAPS: 100.0000 mg | ORAL_CAPSULE | Freq: Three times a day (TID) | ORAL | 4 refills | Status: DC

## 2018-06-05 MED ORDER — LEVETIRACETAM 500 MG PO TABS: 500.0000 mg | ORAL_TABLET | Freq: Two times a day (BID) | ORAL | 4 refills | Status: DC

## 2018-06-05 NOTE — Progress Notes (Signed)
    Virtual Visit via Telephone Note  I connected with SEBA MADOLE on 06/05/18 at  9:30 AM EDT by telephone and verified that I am speaking with the correct person using two identifiers.   I discussed the limitations, risks, security and privacy concerns of performing an evaluation and management service by telephone and the availability of in person appointments. I also discussed with the patient that there may be a patient responsible charge related to this service. The patient expressed understanding and agreed to proceed.   History of Present Illness:  - since last visit stable; no new neurologic issues - planning to get suprapubic catheter due to recurrent UTI (Dr. Natasha Mead) - tolerating rebif    Observations/Objective:  - awake and alert; mild dysarthria   Assessment and Plan:   58 y.o. 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 Last dose tysabri 10/29/10. Switched to [place] on 04/25/11.   Had brief seizure before starting gilenya, now on levetiracetam.   Then with right V2, V3 herpes zoster on 05/27/11. Gilenya stopped. Shingles resolved.  Then on rebif. Also s/p ACDF C5-6 (Oct 2013) for spinal stenosis.   Then with breast CA diagnosis, s/p right mastectomy, s/p adriamycin, cytoxan, taxol. Now treatments have been completed.   Now back on rebif (since Jan 2017).  Now with new onset headaches (June 2017). MRI brain unremarkable. HA now improving.   Dx:  1. Multiple sclerosis ([place])   2. Seizure disorder ([place])   3. Muscle spasm      MULTIPLE SCLEROSIS (established problem, stable) - continue rebif  URINARY INCONTINENCE / RECURRENT UTI - continue oxybutynin - follow up with urology  MUSCLE SPASMS - continue baclofen and tizanidine for muscle spasms (per Dr. Posey Pronto; stable)  MS fatigue - continue amantadine for fatigue (stable)  SEIZURE DISORDER - continue  levetiracetam for seizure d/o (stable)   Follow Up Instructions:  - Return in about 6 months (around 12/05/2018).     I discussed the assessment and treatment plan with the patient. The patient was provided an opportunity to ask questions and all were answered. The patient agreed with the plan and demonstrated an understanding of the instructions.   The patient was advised to call back or seek an in-person evaluation if the symptoms worsen or if the condition fails to improve as anticipated.  I provided 25 minutes of non-face-to-face time during this encounter.   Penni Bombard, MD 05/23/6061, 0:16 AM Certified in Neurology, Neurophysiology and Neuroimaging  [facility] [address] [place], [place] [address] [phone]

## 2018-06-06 ENCOUNTER — Telehealth: Payer: Medicare Other | Admitting: Urology

## 2018-06-06 ENCOUNTER — Other Ambulatory Visit: Payer: Self-pay

## 2018-06-07 ENCOUNTER — Telehealth: Payer: Self-pay | Admitting: Family Medicine

## 2018-06-07 NOTE — Telephone Encounter (Signed)
Copied from [id number]. Topic: General - Inquiry >> May 30, 2018  2:48 PM Scherrie Gerlach wrote: Reason for CRM: pt states she is no longer with [facility] for her in home health care, and  [facility] came out today, and she would like Dr Sanda Klein know this is who she wants. [facility] said that would make their paperwork easier. The name of the company is [employer]. >> Jun 07, 2018 11:55 AM Leward Quan A wrote: Previous message: pt states she is no longer with [facility] for her in home health care, and  [facility] came out today, and she would like Dr Sanda Klein know this is who she wants. [facility] said that would make their paperwork easier. The name of the company is [employer].   Patient called back to check on the status of the paper work pertaining to the previous message above. Patient states that Rezell Alston [address] dropped off some paperwork to be completed and returned for Dr Sanda Klein to get done these forms are so that the new company can take over the patients home care needs. Paperwork was also faxed over several times to Dr Sanda Klein and nothing have been returned. Per patient she is asking for a rush on these forms so that she can get the care she need. Tot reach [employer]. Ph# [phone]  Winf[address] C# [phone] Claire Shown C# [phone] and please call patient at Ph# [phone]      Spoke with Ms. Falwell on today and informed that the paperwork for [place] has been completed and faxed over to [employer].  Pt verbalized understanding.

## 2018-06-08 ENCOUNTER — Emergency Department
Admission: EM | Admit: 2018-06-08 | Discharge: 2018-06-09 | Disposition: A | Discharge status: Home / Self Care | Payer: Medicare Other | Attending: Student in an Organized Health Care Education/Training Program | Admitting: Student in an Organized Health Care Education/Training Program

## 2018-06-08 ENCOUNTER — Telehealth: Payer: Self-pay | Admitting: Family Medicine

## 2018-06-08 ENCOUNTER — Encounter: Payer: Self-pay | Admitting: Emergency Medicine

## 2018-06-08 ENCOUNTER — Other Ambulatory Visit: Payer: Self-pay

## 2018-06-08 DIAGNOSIS — Z853 Personal history of malignant neoplasm of breast: Secondary | ICD-10-CM | POA: Diagnosis not present

## 2018-06-08 DIAGNOSIS — R339 Retention of urine, unspecified: Secondary | ICD-10-CM | POA: Diagnosis not present

## 2018-06-08 DIAGNOSIS — G35 Multiple sclerosis: Secondary | ICD-10-CM | POA: Diagnosis not present

## 2018-06-08 DIAGNOSIS — R5381 Other malaise: Secondary | ICD-10-CM | POA: Diagnosis not present

## 2018-06-08 DIAGNOSIS — Z79899 Other long term (current) drug therapy: Secondary | ICD-10-CM | POA: Diagnosis not present

## 2018-06-08 DIAGNOSIS — I890 Lymphedema, not elsewhere classified: Secondary | ICD-10-CM | POA: Diagnosis not present

## 2018-06-08 DIAGNOSIS — T83198A Other mechanical complication of other urinary devices and implants, initial encounter: Secondary | ICD-10-CM | POA: Diagnosis not present

## 2018-06-08 DIAGNOSIS — Z466 Encounter for fitting and adjustment of urinary device: Secondary | ICD-10-CM | POA: Diagnosis not present

## 2018-06-08 DIAGNOSIS — Z20828 Contact with and (suspected) exposure to other viral communicable diseases: Secondary | ICD-10-CM | POA: Diagnosis not present

## 2018-06-08 DIAGNOSIS — N319 Neuromuscular dysfunction of bladder, unspecified: Secondary | ICD-10-CM | POA: Diagnosis not present

## 2018-06-08 DIAGNOSIS — G40909 Epilepsy, unspecified, not intractable, without status epilepticus: Secondary | ICD-10-CM | POA: Diagnosis not present

## 2018-06-08 DIAGNOSIS — K592 Neurogenic bowel, not elsewhere classified: Secondary | ICD-10-CM | POA: Diagnosis not present

## 2018-06-08 LAB — URINALYSIS, COMPLETE (UACMP) WITH MICROSCOPIC
Bilirubin Urine: NEGATIVE
Glucose, UA: NEGATIVE mg/dL
Ketones, ur: NEGATIVE mg/dL
Nitrite: NEGATIVE
Protein, ur: 100 mg/dL — AB
RBC / HPF: >50 RBC/hpf — ABNORMAL HIGH (ref 0–5)
Specific Gravity, Urine: 1.023 (ref 1.005–1.030)
WBC, UA: >50 WBC/hpf — ABNORMAL HIGH (ref 0–5)
pH: 5 (ref 5.0–8.0)

## 2018-06-08 NOTE — ED Triage Notes (Signed)
Patient presents to Emergency Department via [facility] from HOME with complaints of "needing a foley placed"  Pt reports continous foley use since Dec 31 for hx MS and incontinence.  Pt contacted [facility] and the home healthcare RN attempted to reposition without success

## 2018-06-08 NOTE — ED Notes (Signed)
Report nurses ar not able to insert foley at home. Patient with MS / chronic foley use. 16 fr. Placed with no difficulties. Post foley placement bladder scan done, no urine in bladder noted. 200cc cloudy urine drainage noted upon foley insertion.

## 2018-06-08 NOTE — ED Notes (Signed)
Report from jeanette, rn.  

## 2018-06-08 NOTE — ED Provider Notes (Signed)
[facility] Emergency Department Provider Note    First MD Initiated Contact with Patient 06/08/18 2303     (approximate)  I have reviewed the triage vital signs and the nursing notes.   HISTORY  Chief Complaint Urinary Retention and foley problem    HPI Rebecca [name] is a 58 y.o. female   with a history of MS with chronic indwelling Foley catheter presents the ER for Foley catheter replacement.  Patient has this done routinely as an outpatient but the home health provider was unable to insert urethral catheter this evening.  It was exchanged because she felt that the catheter was clogged and she was having urine leak around the catheter.  Denies any nausea vomiting or fevers.  No pain.  Denies any other symptoms.   Past Medical History:  Diagnosis Date  . Aortic valve disease    Mild AS / AI - most recent Echo demonstrated tricuspid aortic valve.  . Bacterial endocarditis    History of .  Marland Kitchen Bilateral lower extremity edema    Noncardiac.  Chronic. LE Venous dopplers - negative for DVT.; Echocardiogram January 2016: Normal EF with normal wall motion and valve function. Only grade 1 diastolic dysfunction. EF 60-65%. Mild MR  . Breast cancer ([place]) 12-31-13   Right breast, 12:00, 1.5 cm, T1c,N0 invasive mammary carcinoma, triple negative. --> Rx with Chemo  . Cervical stenosis of spine   . Herpes zoster   . IBS (irritable bowel syndrome)   . Lymphedema    has legs wrapped at [facility]  . Multiple sclerosis ([place]) 2001   Walks from room to room @ home; but Wheelchair when going out.  Marland Kitchen Neuromuscular disorder ([place])    MS  . Seizures ([place])    Takes Keppra  . Syncope and collapse    Family History  Problem Relation Age of Onset  . Cancer Father        skin  . Heart disease Father   . Heart attack Father        heart attack in his 58's  . Thyroid disease Sister   . Ovarian cancer Cousin   . Breast cancer Maternal Aunt 60  . Breast cancer Maternal  Grandmother 51  . Bladder Cancer Neg Hx   . Kidney cancer Neg Hx    Past Surgical History:  Procedure Laterality Date  . ANKLE SURGERY     Left  . ANKLE SURGERY    . ANTERIOR CERVICAL DECOMP/DISCECTOMY FUSION  11/17/2011   Procedure: ANTERIOR CERVICAL DECOMPRESSION/DISCECTOMY FUSION 2 LEVELS;  Surgeon: Erline Levine, MD;  Location: [place] [facility];  Service: Neurosurgery;  Laterality: N/A;  Cervical Five-Six Six-Seven Anterior cervical decompression/diskectomy/fusion  . BREAST BIOPSY Right 12-31-13   invasive mammary  . BREAST SURGERY Right 02/03/2014   Right simple mastectomy with sentinel node biopsy.  . CHOLECYSTECTOMY    . COLONOSCOPY  2014  . Lower extremity venous Dopplers  Feb 27, 2013   No LE DVT  . MASTECTOMY Right 2015  . Port a cath insertion Right 01/19/2010  . PORT-A-CATH REMOVAL     right  . PORT-A-CATH REMOVAL Right 09/03/2013   Procedure: REMOVAL PORT-A-CATH;  Surgeon: Conrad Hadley, MD;  Location: [place];  Service: Vascular;  Laterality: Right;  . TRANSTHORACIC ECHOCARDIOGRAM  03/2013; 02/2014   a) Normal LV size and function with EF 60-65%.; Cannot exclude bicuspid aortic valve with mild AS and mild AI.; b) Normal EF with normal wall motion and valve function  x Mild MR. G2 DD. EF 60-65%. Tricuspid AoV  . UPPER GI ENDOSCOPY  2014   Patient Active Problem List   Diagnosis Date Noted  . Ambulatory dysfunction 02/02/2018  . Abnormality of gait 11/17/2017  . 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 Bilateral lower extremity pain (primary) (bilateral) (right greater than left) 08/01/2016  . Chronic neck pain (secondary) (bilateral) ( left greater than right) 07/28/2016  . Long term current use of opiate analgesic 07/28/2016  . Long term prescription opiate use 07/28/2016  . Opiate use 07/28/2016  . Neutropenia ([place]) 06/27/2016  . Chronic pain syndrome 06/03/2016  . Adjustment disorder with mixed anxiety and depressed mood   . Neurogenic  bladder   . Neurogenic bowel   . Detrusor and sphincter dyssynergia   . Urinary incontinence   . Seizure disorder ([place])   . Slow transit constipation   . 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 Lymphedema   . Hypoalbuminemia due to protein-calorie malnutrition ([place])   . Encounter for screening for cervical cancer  11/27/2015  . Preventative health care 11/25/2015  . History of breast cancer in female 10/28/2015  . Anemia 10/28/2015  . Thrombocytopenia ([place]) 10/28/2015  . Allergic rhinitis 10/28/2015  . IBS (irritable bowel syndrome) 10/28/2015  . 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 History of right mastectomy 10/24/2014  . Medicare annual wellness visit, subsequent 10/24/2014  . MS (multiple sclerosis) ([place]) 09/16/2014  . Hematoma complicating a procedure 03/06/2014  . Primary cancer of right female breast ([place]) 01/10/2014  . SOB (shortness of breath) 03/01/2013  . Bilateral lower extremity edema   . 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 Authorizing Provider  amantadine (SYMMETREL) 100 MG capsule Take 1 capsule (100 mg total) by mouth 3 (three) times daily. 06/05/18   Penumalli, Earlean Polka, MD  baclofen (LIORESAL) 10 MG tablet TAKE 1 TO 2 TABLETS BY MOUTH 3 TIMES A DAY 03/20/18   Penumalli, Earlean Polka, MD  Disposable Gloves (ASSURANCE VINYL EXAM GLOVES) MISC Neurogenic bladder; MS; LON 99 months; for use for personal hygiene 12/26/17   Lada, Satira Anis, MD  glucosamine-chondroitin 500-400 MG tablet Take 1 tablet by mouth 4 (four) times daily.    [provider]  ibuprofen (ADVIL,MOTRIN) 600 MG tablet Take 1 tablet (600 mg total) by mouth every 8 (eight) hours as needed. 04/18/18   [name], MD  Interferon Beta-1a (REBIF REBIDOSE) 44 MCG/0.5ML SOAJ Inject 0.5 mLs into the skin every Monday, Wednesday, and Friday. 03/21/18   Penumalli, Earlean Polka, MD  levETIRAcetam (KEPPRA) 500 MG tablet Take 1 tablet  (500 mg total) by mouth 2 (two) times daily. 06/05/18   Penumalli, Earlean Polka, MD  loratadine (CLARITIN) 10 MG tablet Take 10 mg by mouth daily.    [provider]  Misc Natural Products (LEG VEIN & CIRCULATION) TABS Take 1 tablet by mouth as directed.    [provider]  Multiple Vitamins-Minerals (HAIR SKIN AND NAILS FORMULA) TABS Take 1 tablet by mouth daily.     [provider]  Multiple Vitamins-Minerals (MULTIVITAMIN PO) Take 1 tablet by mouth daily.     [provider]  Multiple Vitamins-Minerals (PRESERVISION AREDS 2) CAPS Take 1 capsule by mouth 2 (two) times daily.    [provider]  oxybutynin (DITROPAN-XL) 10 MG 24 hr tablet TAKE 2 TABLETS BY MOUTH EVERY DAY 04/20/18   Penumalli,  Earlean Polka, MD  tiZANidine (ZANAFLEX) 4 MG tablet TAKE 1/2 TABLETS (2 MG TOTAL) BY MOUTH 2 (TWO) TIMES DAILY AS NEEDED (SPASTICITY UNCONTROLLED). 05/07/18   Jamse Arn, MD  Turmeric 500 MG CAPS Take 500 mg by mouth daily.    [provider]    Allergies Fentanyl and Sulfa antibiotics    Social History Social History   Tobacco Use  . Smoking status: Never Smoker  . Smokeless tobacco: Never Used  Substance Use Topics  . Alcohol use: No  . Drug use: No    Review of Systems Patient denies headaches, rhinorrhea, blurry vision, numbness, shortness of breath, chest pain, edema, cough, abdominal pain, nausea, vomiting, diarrhea, dysuria, fevers, rashes or hallucinations unless otherwise stated above in HPI. ____________________________________________   PHYSICAL EXAM:  VITAL SIGNS: Vitals:   06/08/18 2138  BP: (!) 102/91  Pulse: 76  Resp: 18  Temp: 97.7 F (36.5 C)  SpO2: 100%    Constitutional: Alert and oriented.  Eyes: Conjunctivae are normal.  Head: Atraumatic. Nose: No congestion/rhinnorhea. Mouth/Throat: Mucous membranes are moist.   Neck: No stridor. Painless ROM.  Cardiovascular: Normal rate, regular rhythm. Grossly normal  heart sounds.  Good peripheral circulation. Respiratory: Normal respiratory effort.  No retractions. Lungs CTAB. Gastrointestinal: Soft and nontender. No distention. No abdominal bruits. No CVA tenderness. Genitourinary: deferred Musculoskeletal: No lower extremity tenderness nor edema.  No joint effusions. Neurologic:  Normal speech and language. No gross focal neurologic deficits are appreciated. No facial droop Skin:  Skin is warm, dry and intact. No rash noted. Psychiatric: Mood and affect are normal. Speech and behavior are normal.  ____________________________________________   LABS (all labs ordered are listed, but only abnormal results are displayed)  No results found for this or any previous visit (from the past 24 hour(s)). ____________________________________________ ____________________________________________  [id number]   ____________________________________________   PROCEDURES  Procedure(s) performed:  Procedures    Critical Care performed: no ____________________________________________   INITIAL IMPRESSION / ASSESSMENT AND PLAN / ED COURSE  Pertinent labs & imaging results that were available during my care of the patient were reviewed by me and considered in my medical decision making (see chart for details).   DDX: Urinary retention, catheter displacement, catheter malfunction  Rebecca [name] is a 58 y.o. who presents to the ED with symptoms as described above.  Patient is afebrile and hemodynamically stable.  Catheter inserted without any complication.  Will send urine off for study and culture but she is asymptomatic at this time without any signs of infection therefore I do believe that results are appropriate for outpatient follow-up.  Have discussed with the patient and available family all diagnostics and treatments performed thus far and all questions were answered to the best of my ability. The patient demonstrates understanding and agreement with  plan.      The patient was evaluated in Emergency Department today for the symptoms described in the history of present illness. He/she was evaluated in the context of the global COVID-19 pandemic, which necessitated consideration that the patient might be at risk for infection with the SARS-CoV-2 virus that causes COVID-19. Institutional protocols and algorithms that pertain to the evaluation of patients at risk for COVID-19 are in a state of rapid change based on information released by regulatory bodies including the CDC and federal and state organizations. These policies and algorithms were followed during the patient's care in the ED.  As part of my medical decision making, I reviewed the following data within the  electronic MEDICAL RECORD NUMBER Nursing notes reviewed and incorporated, Labs reviewed, notes from prior ED visits and [place] Controlled Substance Database   ____________________________________________   FINAL CLINICAL IMPRESSION(S) / ED DIAGNOSES  Final diagnoses:  Urinary retention      NEW MEDICATIONS STARTED DURING THIS VISIT:  New Prescriptions   No medications on file     Note:  This document was prepared using Dragon voice recognition software and may include unintentional dictation errors.    Merlyn Lot, MD 06/08/18 [phone]

## 2018-06-08 NOTE — Telephone Encounter (Signed)
Orlinda Blalock, [facility] triage RN calling to request a different size in a urinary catheter due to pt complaints of leaking around catheter insertion site. Dr. Rosanna Randy, on call physician contacted and conference call initiated for further orders and recommendations.

## 2018-06-08 NOTE — ED Notes (Signed)
MD in to see pt.

## 2018-06-09 DIAGNOSIS — R279 Unspecified lack of coordination: Secondary | ICD-10-CM | POA: Diagnosis not present

## 2018-06-09 DIAGNOSIS — R5381 Other malaise: Secondary | ICD-10-CM | POA: Diagnosis not present

## 2018-06-09 DIAGNOSIS — Z743 Need for continuous supervision: Secondary | ICD-10-CM | POA: Diagnosis not present

## 2018-06-09 NOTE — ED Notes (Signed)
Report given to [facility]. Patient's groin area cleaned and brief changed.

## 2018-06-09 NOTE — ED Notes (Signed)
Report given to [facility].

## 2018-06-09 NOTE — ED Notes (Signed)
Pt updated on wait for ems.

## 2018-06-10 LAB — URINE CULTURE: Culture: NO GROWTH

## 2018-06-11 ENCOUNTER — Ambulatory Visit: Payer: Medicare Other | Admitting: Diagnostic Neuroimaging

## 2018-06-11 NOTE — Telephone Encounter (Signed)
Left voicemail that we do not follow her catheter concerns, to please contact urlogist

## 2018-06-15 DIAGNOSIS — N319 Neuromuscular dysfunction of bladder, unspecified: Secondary | ICD-10-CM | POA: Diagnosis not present

## 2018-06-15 DIAGNOSIS — G35 Multiple sclerosis: Secondary | ICD-10-CM | POA: Diagnosis not present

## 2018-06-15 DIAGNOSIS — Z466 Encounter for fitting and adjustment of urinary device: Secondary | ICD-10-CM | POA: Diagnosis not present

## 2018-06-15 DIAGNOSIS — G40909 Epilepsy, unspecified, not intractable, without status epilepticus: Secondary | ICD-10-CM | POA: Diagnosis not present

## 2018-06-15 DIAGNOSIS — K592 Neurogenic bowel, not elsewhere classified: Secondary | ICD-10-CM | POA: Diagnosis not present

## 2018-06-15 DIAGNOSIS — I890 Lymphedema, not elsewhere classified: Secondary | ICD-10-CM | POA: Diagnosis not present

## 2018-06-16 DIAGNOSIS — K592 Neurogenic bowel, not elsewhere classified: Secondary | ICD-10-CM | POA: Diagnosis not present

## 2018-06-16 DIAGNOSIS — M542 Cervicalgia: Secondary | ICD-10-CM | POA: Diagnosis not present

## 2018-06-16 DIAGNOSIS — G35 Multiple sclerosis: Secondary | ICD-10-CM | POA: Diagnosis not present

## 2018-06-16 DIAGNOSIS — Z853 Personal history of malignant neoplasm of breast: Secondary | ICD-10-CM | POA: Diagnosis not present

## 2018-06-16 DIAGNOSIS — I890 Lymphedema, not elsewhere classified: Secondary | ICD-10-CM | POA: Diagnosis not present

## 2018-06-16 DIAGNOSIS — Z466 Encounter for fitting and adjustment of urinary device: Secondary | ICD-10-CM | POA: Diagnosis not present

## 2018-06-16 DIAGNOSIS — G40909 Epilepsy, unspecified, not intractable, without status epilepticus: Secondary | ICD-10-CM | POA: Diagnosis not present

## 2018-06-16 DIAGNOSIS — G8929 Other chronic pain: Secondary | ICD-10-CM | POA: Diagnosis not present

## 2018-06-16 DIAGNOSIS — N319 Neuromuscular dysfunction of bladder, unspecified: Secondary | ICD-10-CM | POA: Diagnosis not present

## 2018-06-16 DIAGNOSIS — Z79891 Long term (current) use of opiate analgesic: Secondary | ICD-10-CM | POA: Diagnosis not present

## 2018-06-16 DIAGNOSIS — Z9011 Acquired absence of right breast and nipple: Secondary | ICD-10-CM | POA: Diagnosis not present

## 2018-06-18 DIAGNOSIS — G35 Multiple sclerosis: Secondary | ICD-10-CM | POA: Diagnosis not present

## 2018-06-18 DIAGNOSIS — Z466 Encounter for fitting and adjustment of urinary device: Secondary | ICD-10-CM | POA: Diagnosis not present

## 2018-06-18 DIAGNOSIS — K592 Neurogenic bowel, not elsewhere classified: Secondary | ICD-10-CM | POA: Diagnosis not present

## 2018-06-18 DIAGNOSIS — N319 Neuromuscular dysfunction of bladder, unspecified: Secondary | ICD-10-CM | POA: Diagnosis not present

## 2018-06-18 DIAGNOSIS — G40909 Epilepsy, unspecified, not intractable, without status epilepticus: Secondary | ICD-10-CM | POA: Diagnosis not present

## 2018-06-18 DIAGNOSIS — I890 Lymphedema, not elsewhere classified: Secondary | ICD-10-CM | POA: Diagnosis not present

## 2018-06-25 ENCOUNTER — Telehealth: Payer: Self-pay

## 2018-06-25 NOTE — Telephone Encounter (Signed)
Copied from [place] [id number]. Topic: General - Inquiry >> Jun 25, 2018  1:59 PM Virl Axe D wrote: Reason for CRM: Jazueline with Adult Protective Services stated there is an open adult protective report on pt. She would like to know if there are any concerns regarding pt, her treatment at home, etc. Requesting a return call from Dr. Sanda Klein or her assistant. Please advise [phone]

## 2018-06-25 NOTE — Telephone Encounter (Signed)
Performed chart review including recent visit with Dr. Sanda Klein and with neurologist.  I do not see any notations for concern of patient's home life or treatment, nor do I see any documentation of APS being contacted by this clinic.  Did leave my cell phone number in a voice mssage for APS to call me back.  If they contact the office, please provide my number so that we may speak directly.

## 2018-06-28 DIAGNOSIS — N319 Neuromuscular dysfunction of bladder, unspecified: Secondary | ICD-10-CM | POA: Diagnosis not present

## 2018-06-28 DIAGNOSIS — G40909 Epilepsy, unspecified, not intractable, without status epilepticus: Secondary | ICD-10-CM | POA: Diagnosis not present

## 2018-06-28 DIAGNOSIS — K592 Neurogenic bowel, not elsewhere classified: Secondary | ICD-10-CM | POA: Diagnosis not present

## 2018-06-28 DIAGNOSIS — G35 Multiple sclerosis: Secondary | ICD-10-CM | POA: Diagnosis not present

## 2018-06-28 DIAGNOSIS — Z466 Encounter for fitting and adjustment of urinary device: Secondary | ICD-10-CM | POA: Diagnosis not present

## 2018-06-28 DIAGNOSIS — I890 Lymphedema, not elsewhere classified: Secondary | ICD-10-CM | POA: Diagnosis not present

## 2018-07-09 ENCOUNTER — Encounter: Payer: Medicare Other | Admitting: Obstetrics & Gynecology

## 2018-07-11 DIAGNOSIS — G40909 Epilepsy, unspecified, not intractable, without status epilepticus: Secondary | ICD-10-CM | POA: Diagnosis not present

## 2018-07-11 DIAGNOSIS — I890 Lymphedema, not elsewhere classified: Secondary | ICD-10-CM | POA: Diagnosis not present

## 2018-07-11 DIAGNOSIS — Z466 Encounter for fitting and adjustment of urinary device: Secondary | ICD-10-CM | POA: Diagnosis not present

## 2018-07-11 DIAGNOSIS — K592 Neurogenic bowel, not elsewhere classified: Secondary | ICD-10-CM | POA: Diagnosis not present

## 2018-07-11 DIAGNOSIS — G35 Multiple sclerosis: Secondary | ICD-10-CM | POA: Diagnosis not present

## 2018-07-11 DIAGNOSIS — N319 Neuromuscular dysfunction of bladder, unspecified: Secondary | ICD-10-CM | POA: Diagnosis not present

## 2018-07-12 ENCOUNTER — Encounter: Payer: Medicare Other | Admitting: Obstetrics & Gynecology

## 2018-07-13 ENCOUNTER — Ambulatory Visit: Payer: Medicare Other | Admitting: Family Medicine

## 2018-07-13 DIAGNOSIS — I890 Lymphedema, not elsewhere classified: Secondary | ICD-10-CM | POA: Diagnosis not present

## 2018-07-13 DIAGNOSIS — G35 Multiple sclerosis: Secondary | ICD-10-CM | POA: Diagnosis not present

## 2018-07-13 DIAGNOSIS — K592 Neurogenic bowel, not elsewhere classified: Secondary | ICD-10-CM | POA: Diagnosis not present

## 2018-07-13 DIAGNOSIS — Z466 Encounter for fitting and adjustment of urinary device: Secondary | ICD-10-CM | POA: Diagnosis not present

## 2018-07-13 DIAGNOSIS — N319 Neuromuscular dysfunction of bladder, unspecified: Secondary | ICD-10-CM | POA: Diagnosis not present

## 2018-07-13 DIAGNOSIS — G40909 Epilepsy, unspecified, not intractable, without status epilepticus: Secondary | ICD-10-CM | POA: Diagnosis not present

## 2018-07-16 ENCOUNTER — Ambulatory Visit (INDEPENDENT_AMBULATORY_CARE_PROVIDER_SITE_OTHER): Payer: Medicare Other | Admitting: Nurse Practitioner

## 2018-07-16 ENCOUNTER — Encounter: Payer: Self-pay | Admitting: Nurse Practitioner

## 2018-07-16 ENCOUNTER — Telehealth: Payer: Self-pay | Admitting: Urology

## 2018-07-16 ENCOUNTER — Other Ambulatory Visit: Payer: Self-pay

## 2018-07-16 VITALS — BP 126/80 | HR 86 | Temp 97.8°F | Resp 14

## 2018-07-16 DIAGNOSIS — K592 Neurogenic bowel, not elsewhere classified: Secondary | ICD-10-CM | POA: Diagnosis not present

## 2018-07-16 DIAGNOSIS — Z466 Encounter for fitting and adjustment of urinary device: Secondary | ICD-10-CM | POA: Diagnosis not present

## 2018-07-16 DIAGNOSIS — M542 Cervicalgia: Secondary | ICD-10-CM | POA: Diagnosis not present

## 2018-07-16 DIAGNOSIS — N319 Neuromuscular dysfunction of bladder, unspecified: Secondary | ICD-10-CM | POA: Diagnosis not present

## 2018-07-16 DIAGNOSIS — N390 Urinary tract infection, site not specified: Secondary | ICD-10-CM

## 2018-07-16 DIAGNOSIS — G40909 Epilepsy, unspecified, not intractable, without status epilepticus: Secondary | ICD-10-CM | POA: Diagnosis not present

## 2018-07-16 DIAGNOSIS — G8929 Other chronic pain: Secondary | ICD-10-CM | POA: Diagnosis not present

## 2018-07-16 DIAGNOSIS — I890 Lymphedema, not elsewhere classified: Secondary | ICD-10-CM | POA: Diagnosis not present

## 2018-07-16 DIAGNOSIS — Z853 Personal history of malignant neoplasm of breast: Secondary | ICD-10-CM | POA: Diagnosis not present

## 2018-07-16 DIAGNOSIS — R8290 Unspecified abnormal findings in urine: Secondary | ICD-10-CM | POA: Diagnosis not present

## 2018-07-16 DIAGNOSIS — Z9011 Acquired absence of right breast and nipple: Secondary | ICD-10-CM | POA: Diagnosis not present

## 2018-07-16 DIAGNOSIS — G35 Multiple sclerosis: Secondary | ICD-10-CM | POA: Diagnosis not present

## 2018-07-16 LAB — POCT URINALYSIS DIPSTICK
Bilirubin, UA: NEGATIVE
Glucose, UA: NEGATIVE
Ketones, UA: NEGATIVE
Nitrite, UA: POSITIVE
Protein, UA: POSITIVE — AB
Spec Grav, UA: 1.02 (ref 1.010–1.025)
Urobilinogen, UA: NEGATIVE E.U./dL — AB
pH, UA: 7.5 (ref 5.0–8.0)

## 2018-07-16 MED ORDER — CIPROFLOXACIN HCL 500 MG PO TABS: 500.0000 mg | ORAL_TABLET | Freq: Two times a day (BID) | ORAL | 0 refills | Status: DC

## 2018-07-16 NOTE — Patient Instructions (Signed)
-   Please follow-up with urology  - Please take your antibiotic as prescribed with food on your stomach to prevent nausea and upset stomach. Take the antibiotic for the entire course, even if your symptoms resolve before the course if completed. Using antibiotics inappropriately can make it harder to treat future infections. If you have any concerning side effects please stop the medication and let us know immediately. I do recommend taking a probiotic to replenish your good gut health anytime you take an antibiotic. You can get probiotics in types of yogurt like activia, or other food/drinks such as kimchi, kombucha , sauerkraut, or you can take an over the counter supplement.

## 2018-07-16 NOTE — Telephone Encounter (Signed)
Please have Mrs. Rebecca [name] schedule an appointment to get her set up for a SPT placement.

## 2018-07-16 NOTE — Progress Notes (Signed)
Name: Rebecca [name]   MRN: [id number]    DOB: 1960/03/21   Date:07/16/2018       Progress Note  Subjective  Chief Complaint  Chief Complaint  Patient presents with  . Urinary Tract Infection    Foul odor and cloudy urine    HPI  Patient has MS and is paraplegic. She has chronic urinary foley catheter and sees Dr. Erlene Quan and Carlis Stable- urology for this. Patient is supposed to be switching to a pubic catheter but this is put on hold due to pandemic, so she has not been able to come back to their office. Patient endorses foul odor, increase sediment in catheter bag and home health nurse was concerned for UTI.  Denies nausea, vomiting, fever, chills, vaginal discharge. Cannot appreciate dysuria or lower back pain due to condition.  PHQ2/9: Depression screen [facility] 2/9 07/16/2018 05/23/2018 04/18/2018 12/26/2017 09/08/2017  Decreased Interest 0 0 0 0 0  Down, Depressed, Hopeless 0 0 0 0 0  PHQ - 2 Score 0 0 0 0 0  Altered sleeping 0 - 0 0 -  Tired, decreased energy 0 - 0 0 -  Change in appetite 0 - 0 0 -  Feeling bad or failure about yourself  0 - 0 0 -  Trouble concentrating 0 - 0 0 -  Moving slowly or fidgety/restless 0 - 0 0 -  Suicidal thoughts 0 - 0 0 -  PHQ-9 Score 0 - 0 0 -  Difficult doing work/chores Not difficult at all - Not difficult at all Not difficult at all -  Some recent data might be hidden    PHQ reviewed. Negative  Patient Active Problem List   Diagnosis Date Noted  . Ambulatory dysfunction 02/02/2018  . Abnormality of gait 11/17/2017  . 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 Bilateral lower extremity pain (primary) (bilateral) (right greater than left) 08/01/2016  . Chronic neck pain (secondary) (bilateral) ( left greater than right) 07/28/2016  . Long term current use of opiate analgesic 07/28/2016  . Long term prescription opiate use 07/28/2016  . Opiate use 07/28/2016  . Neutropenia ([place]) 06/27/2016  . Chronic pain syndrome  06/03/2016  . Adjustment disorder with mixed anxiety and depressed mood   . Neurogenic bladder   . Neurogenic bowel   . Detrusor and sphincter dyssynergia   . Urinary incontinence   . Seizure disorder ([place])   . Slow transit constipation   . 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 Lymphedema   . Hypoalbuminemia due to protein-calorie malnutrition ([place])   . Encounter for screening for cervical cancer  11/27/2015  . Preventative health care 11/25/2015  . History of breast cancer in female 10/28/2015  . Anemia 10/28/2015  . Thrombocytopenia ([place]) 10/28/2015  . Allergic rhinitis 10/28/2015  . IBS (irritable bowel syndrome) 10/28/2015  . 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 History of right mastectomy 10/24/2014  . Medicare annual wellness visit, subsequent 10/24/2014  . MS (multiple sclerosis) ([place]) 09/16/2014  . Hematoma complicating a procedure 03/06/2014  . Primary cancer of right female breast ([place]) 01/10/2014  . SOB (shortness of breath) 03/01/2013  . Bilateral lower extremity edema   . 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 Aortic valve disease    Mild AS / AI - most recent Echo demonstrated tricuspid aortic valve.  . Bacterial endocarditis    History of .  Marland Kitchen Bilateral lower extremity edema  Noncardiac.  Chronic. LE Venous dopplers - negative for DVT.; Echocardiogram January 2016: Normal EF with normal wall motion and valve function. Only grade 1 diastolic dysfunction. EF 60-65%. Mild MR  . Breast cancer ([place]) 12-31-13   Right breast, 12:00, 1.5 cm, T1c,N0 invasive mammary carcinoma, triple negative. --> Rx with Chemo  . Cervical stenosis of spine   . Herpes zoster   . IBS (irritable bowel syndrome)   . Lymphedema    has legs wrapped at [facility]  . Multiple sclerosis ([place]) 2001   Walks from room to room @ home; but Wheelchair when going out.  Marland Kitchen Neuromuscular disorder ([place])    MS  . Seizures ([place])    Takes Keppra  . Syncope and  collapse     Past Surgical History:  Procedure Laterality Date  . ANKLE SURGERY     Left  . ANKLE SURGERY    . ANTERIOR CERVICAL DECOMP/DISCECTOMY FUSION  11/17/2011   Procedure: ANTERIOR CERVICAL DECOMPRESSION/DISCECTOMY FUSION 2 LEVELS;  Surgeon: Erline Levine, MD;  Location: [place] [facility];  Service: Neurosurgery;  Laterality: N/A;  Cervical Five-Six Six-Seven Anterior cervical decompression/diskectomy/fusion  . BREAST BIOPSY Right 12-31-13   invasive mammary  . BREAST SURGERY Right 02/03/2014   Right simple mastectomy with sentinel node biopsy.  . CHOLECYSTECTOMY    . COLONOSCOPY  2014  . Lower extremity venous Dopplers  Feb 27, 2013   No LE DVT  . MASTECTOMY Right 2015  . Port a cath insertion Right 01/19/2010  . PORT-A-CATH REMOVAL     right  . PORT-A-CATH REMOVAL Right 09/03/2013   Procedure: REMOVAL PORT-A-CATH;  Surgeon: Conrad Damascus, MD;  Location: [place];  Service: Vascular;  Laterality: Right;  . TRANSTHORACIC ECHOCARDIOGRAM  03/2013; 02/2014   a) Normal LV size and function with EF 60-65%.; Cannot exclude bicuspid aortic valve with mild AS and mild AI.; b) Normal EF with normal wall motion and valve function x Mild MR. G2 DD. EF 60-65%. Tricuspid AoV  . UPPER GI ENDOSCOPY  2014    Social History   Tobacco Use  . Smoking status: Never Smoker  . Smokeless tobacco: Never Used  Substance Use Topics  . Alcohol use: No     Current Outpatient Medications:  .  amantadine (SYMMETREL) 100 MG capsule, Take 1 capsule (100 mg total) by mouth 3 (three) times daily., Disp: 270 capsule, Rfl: 4 .  baclofen (LIORESAL) 10 MG tablet, TAKE 1 TO 2 TABLETS BY MOUTH 3 TIMES A DAY, Disp: 540 tablet, Rfl: 2 .  glucosamine-chondroitin 500-400 MG tablet, Take 1 tablet by mouth 4 (four) times daily., Disp: , Rfl:  .  ibuprofen (ADVIL,MOTRIN) 600 MG tablet, Take 1 tablet (600 mg total) by mouth every 8 (eight) hours as needed., Disp: 180 tablet, Rfl: 1 .  Interferon Beta-1a (REBIF REBIDOSE) 44  MCG/0.5ML SOAJ, Inject 0.5 mLs into the skin every Monday, Wednesday, and Friday., Disp: 12 Syringe, Rfl: 11 .  levETIRAcetam (KEPPRA) 500 MG tablet, Take 1 tablet (500 mg total) by mouth 2 (two) times daily., Disp: 180 tablet, Rfl: 4 .  loratadine (CLARITIN) 10 MG tablet, Take 10 mg by mouth daily., Disp: , Rfl:  .  Misc Natural Products (LEG VEIN & CIRCULATION) TABS, Take 1 tablet by mouth as directed., Disp: , Rfl:  .  Multiple Vitamins-Minerals (HAIR SKIN AND NAILS FORMULA) TABS, Take 1 tablet by mouth daily. , Disp: , Rfl:  .  Multiple Vitamins-Minerals (MULTIVITAMIN PO), Take 1 tablet by mouth daily. ,  Disp: , Rfl:  .  Multiple Vitamins-Minerals (PRESERVISION AREDS 2) CAPS, Take 1 capsule by mouth 2 (two) times daily., Disp: , Rfl:  .  oxybutynin (DITROPAN-XL) 10 MG 24 hr tablet, TAKE 2 TABLETS BY MOUTH EVERY DAY, Disp: 180 tablet, Rfl: 3 .  tiZANidine (ZANAFLEX) 4 MG tablet, TAKE 1/2 TABLETS (2 MG TOTAL) BY MOUTH 2 (TWO) TIMES DAILY AS NEEDED (SPASTICITY UNCONTROLLED)., Disp: 90 tablet, Rfl: 0 .  Turmeric 500 MG CAPS, Take 500 mg by mouth daily., Disp: , Rfl:  .  Disposable Gloves (ASSURANCE VINYL EXAM GLOVES) MISC, Neurogenic bladder; MS; LON 99 months; for use for personal hygiene, Disp: 50 each, Rfl: 11  Allergies  Allergen Reactions  . Fentanyl Nausea And Vomiting and Nausea Only    vomiting Was given in PACU x3 each time patient got sick vomiting Was given in PACU x3 each time patient got sick  . Sulfa Antibiotics Hives and Other (See Comments)    Light headed, over heated    ROS    No other specific complaints in a complete review of systems (except as listed in HPI above).  Objective  Vitals:   07/16/18 1405  BP: 126/80  Pulse: 86  Resp: 14  Temp: 97.8 F (36.6 C)  TempSrc: Oral  SpO2: 96%     There is no height or weight on file to calculate BMI.  Nursing Note and Vital Signs reviewed.  Physical Exam Constitutional:      Appearance: Normal appearance.   HENT:     Head: Atraumatic.  Eyes:     Conjunctiva/sclera: Conjunctivae normal.  Cardiovascular:     Rate and Rhythm: Normal rate.  Pulmonary:     Effort: Pulmonary effort is normal.  Abdominal:     Tenderness: There is no right CVA tenderness or left CVA tenderness.  Skin:    General: Skin is warm and dry.  Neurological:     Mental Status: She is alert. Mental status is at baseline.  Psychiatric:        Mood and Affect: Mood normal.        Behavior: Behavior normal.      No results found for this or any previous visit (from the past 48 hour(s)).  Assessment & Plan  1. Foul smelling urine  - POCT Urinalysis Dipstick - Urinalysis, Routine w reflex microscopic  2. Complicated UTI (urinary tract infection) Follow-up with urology for repeat to re-check urine for protein & blood and infection  - ciprofloxacin (CIPRO) 500 MG tablet; Take 1 tablet (500 mg total) by mouth 2 (two) times daily.  Dispense: 14 tablet; Refill: 0

## 2018-07-17 ENCOUNTER — Other Ambulatory Visit: Payer: Self-pay

## 2018-07-17 ENCOUNTER — Inpatient Hospital Stay: Payer: Medicare Other | Attending: Oncology

## 2018-07-17 DIAGNOSIS — N3080 Other cystitis without hematuria: Secondary | ICD-10-CM | POA: Diagnosis not present

## 2018-07-17 DIAGNOSIS — D696 Thrombocytopenia, unspecified: Secondary | ICD-10-CM | POA: Insufficient documentation

## 2018-07-17 DIAGNOSIS — Z171 Estrogen receptor negative status [ER-]: Secondary | ICD-10-CM | POA: Diagnosis not present

## 2018-07-17 DIAGNOSIS — Z8349 Family history of other endocrine, nutritional and metabolic diseases: Secondary | ICD-10-CM | POA: Insufficient documentation

## 2018-07-17 DIAGNOSIS — Z8041 Family history of malignant neoplasm of ovary: Secondary | ICD-10-CM | POA: Insufficient documentation

## 2018-07-17 DIAGNOSIS — R531 Weakness: Secondary | ICD-10-CM | POA: Diagnosis not present

## 2018-07-17 DIAGNOSIS — Z8249 Family history of ischemic heart disease and other diseases of the circulatory system: Secondary | ICD-10-CM | POA: Insufficient documentation

## 2018-07-17 DIAGNOSIS — Z803 Family history of malignant neoplasm of breast: Secondary | ICD-10-CM | POA: Insufficient documentation

## 2018-07-17 DIAGNOSIS — G40909 Epilepsy, unspecified, not intractable, without status epilepticus: Secondary | ICD-10-CM | POA: Insufficient documentation

## 2018-07-17 DIAGNOSIS — G629 Polyneuropathy, unspecified: Secondary | ICD-10-CM | POA: Insufficient documentation

## 2018-07-17 DIAGNOSIS — R600 Localized edema: Secondary | ICD-10-CM | POA: Insufficient documentation

## 2018-07-17 DIAGNOSIS — Z452 Encounter for adjustment and management of vascular access device: Secondary | ICD-10-CM | POA: Diagnosis not present

## 2018-07-17 DIAGNOSIS — M549 Dorsalgia, unspecified: Secondary | ICD-10-CM | POA: Diagnosis not present

## 2018-07-17 DIAGNOSIS — C50911 Malignant neoplasm of unspecified site of right female breast: Secondary | ICD-10-CM | POA: Insufficient documentation

## 2018-07-17 DIAGNOSIS — Z95828 Presence of other vascular implants and grafts: Secondary | ICD-10-CM

## 2018-07-17 DIAGNOSIS — G35 Multiple sclerosis: Secondary | ICD-10-CM | POA: Insufficient documentation

## 2018-07-17 DIAGNOSIS — I890 Lymphedema, not elsewhere classified: Secondary | ICD-10-CM | POA: Diagnosis not present

## 2018-07-17 MED ORDER — SODIUM CHLORIDE 0.9% FLUSH
10.0000 mL | Freq: Once | INTRAVENOUS | Status: AC
  Administered 2018-07-17: 10 mL via INTRAVENOUS
  Filled 2018-07-17: qty 10

## 2018-07-17 MED ORDER — HEPARIN SOD (PORK) LOCK FLUSH 100 UNIT/ML IV SOLN
500.0000 [IU] | Freq: Once | INTRAVENOUS | Status: AC
  Administered 2018-07-17: 500 [IU] via INTRAVENOUS

## 2018-07-17 NOTE — Telephone Encounter (Signed)
Appointment made. Patient aware. 

## 2018-07-26 DIAGNOSIS — Z466 Encounter for fitting and adjustment of urinary device: Secondary | ICD-10-CM | POA: Diagnosis not present

## 2018-07-26 DIAGNOSIS — G40909 Epilepsy, unspecified, not intractable, without status epilepticus: Secondary | ICD-10-CM | POA: Diagnosis not present

## 2018-07-26 DIAGNOSIS — G35 Multiple sclerosis: Secondary | ICD-10-CM | POA: Diagnosis not present

## 2018-07-26 DIAGNOSIS — N319 Neuromuscular dysfunction of bladder, unspecified: Secondary | ICD-10-CM | POA: Diagnosis not present

## 2018-07-26 DIAGNOSIS — I890 Lymphedema, not elsewhere classified: Secondary | ICD-10-CM | POA: Diagnosis not present

## 2018-07-26 DIAGNOSIS — K592 Neurogenic bowel, not elsewhere classified: Secondary | ICD-10-CM | POA: Diagnosis not present

## 2018-07-27 ENCOUNTER — Telehealth: Payer: Self-pay

## 2018-07-27 DIAGNOSIS — G35 Multiple sclerosis: Secondary | ICD-10-CM | POA: Diagnosis not present

## 2018-07-27 DIAGNOSIS — Z466 Encounter for fitting and adjustment of urinary device: Secondary | ICD-10-CM | POA: Diagnosis not present

## 2018-07-27 DIAGNOSIS — I890 Lymphedema, not elsewhere classified: Secondary | ICD-10-CM | POA: Diagnosis not present

## 2018-07-27 DIAGNOSIS — G40909 Epilepsy, unspecified, not intractable, without status epilepticus: Secondary | ICD-10-CM | POA: Diagnosis not present

## 2018-07-27 DIAGNOSIS — K592 Neurogenic bowel, not elsewhere classified: Secondary | ICD-10-CM | POA: Diagnosis not present

## 2018-07-27 DIAGNOSIS — N319 Neuromuscular dysfunction of bladder, unspecified: Secondary | ICD-10-CM | POA: Diagnosis not present

## 2018-07-27 NOTE — Telephone Encounter (Signed)
According to [facility], patient called them complaining again of continued bladder spasms and demanded they come change her catheter, if not she would go to the ED. Per Larene Beach, change cath. Vear Clock and she verbalized understanding.

## 2018-07-29 NOTE — Progress Notes (Deleted)
07/30/2018 6:45 PM   Rebecca [name] [birth year] [id number]  Referring provider: [name], [place],  [place] [address]  No chief complaint on file.   HPI: Rebecca [name] is a 58 year old female with MS who has a chronic indwelling Foley who presents today to discuss other options.  Neurogenic bladder With MS.  Has had indwelling Foley for several months.  RUS in 05/2018 noted a 6 mm right renal stone.           PMH: Past Medical History:  Diagnosis Date  . Aortic valve disease    Mild AS / AI - most recent Echo demonstrated tricuspid aortic valve.  . Bacterial endocarditis    History of .  Marland Kitchen Bilateral lower extremity edema    Noncardiac.  Chronic. LE Venous dopplers - negative for DVT.; Echocardiogram January 2016: Normal EF with normal wall motion and valve function. Only grade 1 diastolic dysfunction. EF 60-65%. Mild MR  . Breast cancer ([place]) 12-31-13   Right breast, 12:00, 1.5 cm, T1c,N0 invasive mammary carcinoma, triple negative. --> Rx with Chemo  . Cervical stenosis of spine   . Herpes zoster   . IBS (irritable bowel syndrome)   . Lymphedema    has legs wrapped at [facility]  . Multiple sclerosis ([place]) 2001   Walks from room to room @ home; but Wheelchair when going out.  Marland Kitchen Neuromuscular disorder ([place])    MS  . Seizures ([place])    Takes Keppra  . Syncope and collapse     Surgical History: Past Surgical History:  Procedure Laterality Date  . ANKLE SURGERY     Left  . ANKLE SURGERY    . ANTERIOR CERVICAL DECOMP/DISCECTOMY FUSION  11/17/2011   Procedure: ANTERIOR CERVICAL DECOMPRESSION/DISCECTOMY FUSION 2 LEVELS;  Surgeon: Erline Levine, MD;  Location: [facility];  Service: Neurosurgery;  Laterality: N/A;  Cervical Five-Six Six-Seven Anterior cervical decompression/diskectomy/fusion  . BREAST BIOPSY Right 12-31-13   invasive mammary  . BREAST SURGERY Right 02/03/2014   Right simple mastectomy with sentinel node biopsy.  .  CHOLECYSTECTOMY    . COLONOSCOPY  2014  . Lower extremity venous Dopplers  Feb 27, 2013   No LE DVT  . MASTECTOMY Right 2015  . Port a cath insertion Right 01/19/2010  . PORT-A-CATH REMOVAL     right  . PORT-A-CATH REMOVAL Right 09/03/2013   Procedure: REMOVAL PORT-A-CATH;  Surgeon: Conrad Plano, MD;  Location: [facility];  Service: Vascular;  Laterality: Right;  . TRANSTHORACIC ECHOCARDIOGRAM  03/2013; 02/2014   a) Normal LV size and function with EF 60-65%.; Cannot exclude bicuspid aortic valve with mild AS and mild AI.; b) Normal EF with normal wall motion and valve function x Mild MR. G2 DD. EF 60-65%. Tricuspid AoV  . UPPER GI ENDOSCOPY  2014    Home Medications:  Allergies as of 07/30/2018      Reactions   Fentanyl Nausea And Vomiting, Nausea Only   vomiting Was given in PACU x3 each time patient got sick vomiting Was given in PACU x3 each time patient got sick   Sulfa Antibiotics Hives, Other (See Comments)   Light headed, over heated      Medication List       Accurate as of July 29, 2018  6:45 PM. If you have any questions, ask your nurse or doctor.        amantadine 100 MG capsule Commonly known as: SYMMETREL Take  1 capsule (100 mg total) by mouth 3 (three) times daily.   Assurance Vinyl Exam Gloves Misc Neurogenic bladder; MS; LON 99 months; for use for personal hygiene   baclofen 10 MG tablet Commonly known as: LIORESAL TAKE 1 TO 2 TABLETS BY MOUTH 3 TIMES A DAY   ciprofloxacin 500 MG tablet Commonly known as: Cipro Take 1 tablet (500 mg total) by mouth 2 (two) times daily.   glucosamine-chondroitin 500-400 MG tablet Take 1 tablet by mouth 4 (four) times daily.   ibuprofen 600 MG tablet Commonly known as: ADVIL Take 1 tablet (600 mg total) by mouth every 8 (eight) hours as needed.   Interferon Beta-1a 44 MCG/0.5ML Soaj Commonly known as: Rebif Rebidose Inject 0.5 mLs into the skin every Monday, Wednesday, and Friday.   Leg Vein & Circulation Tabs Take 1  tablet by mouth as directed.   levETIRAcetam 500 MG tablet Commonly known as: KEPPRA Take 1 tablet (500 mg total) by mouth 2 (two) times daily.   loratadine 10 MG tablet Commonly known as: CLARITIN Take 10 mg by mouth daily.   MULTIVITAMIN PO Take 1 tablet by mouth daily.   Hair Skin and Nails Formula Tabs Take 1 tablet by mouth daily.   PreserVision AREDS 2 Caps Take 1 capsule by mouth 2 (two) times daily.   oxybutynin 10 MG 24 hr tablet Commonly known as: DITROPAN-XL TAKE 2 TABLETS BY MOUTH EVERY DAY   tiZANidine 4 MG tablet Commonly known as: ZANAFLEX TAKE 1/2 TABLETS (2 MG TOTAL) BY MOUTH 2 (TWO) TIMES DAILY AS NEEDED (SPASTICITY UNCONTROLLED).   Turmeric 500 MG Caps Take 500 mg by mouth daily.       Allergies:  Allergies  Allergen Reactions  . Fentanyl Nausea And Vomiting and Nausea Only    vomiting Was given in PACU x3 each time patient got sick vomiting Was given in PACU x3 each time patient got sick  . Sulfa Antibiotics Hives and Other (See Comments)    Light headed, over heated    Family History: Family History  Problem Relation Age of Onset  . Cancer Father        skin  . Heart disease Father   . Heart attack Father        heart attack in his 42's  . Thyroid disease Sister   . Ovarian cancer Cousin   . Breast cancer Maternal Aunt 60  . Breast cancer Maternal Grandmother 54  . Bladder Cancer Neg Hx   . Kidney cancer Neg Hx     Social History:  reports that she has never smoked. She has never used smokeless tobacco. She reports that she does not drink alcohol or use drugs.  ROS:                                        Physical Exam: There were no vitals taken for this visit.  Constitutional:  Well nourished. Alert and oriented, No acute distress. HEENT: [place] AT, moist mucus membranes.  Trachea midline, no masses. Cardiovascular: No clubbing, cyanosis, or edema. Respiratory: Normal respiratory effort, no increased work  of breathing. GI: Abdomen is soft, non tender, non distended, no abdominal masses. Liver and spleen not palpable.  No hernias appreciated.  Stool sample for occult testing is not indicated.   GU: No CVA tenderness.  No bladder fullness or masses.  *** external genitalia, *** pubic hair distribution,  no lesions.  Normal urethral meatus, no lesions, no prolapse, no discharge.   No urethral masses, tenderness and/or tenderness. No bladder fullness, tenderness or masses. *** vagina mucosa, *** estrogen effect, no discharge, no lesions, *** pelvic support, *** cystocele and *** rectocele noted.  No cervical motion tenderness.  Uterus is freely mobile and non-fixed.  No adnexal/parametria masses or tenderness noted.  Anus and perineum are without rashes or lesions.   ***  Skin: No rashes, bruises or suspicious lesions. Lymph: No cervical or inguinal adenopathy. Neurologic: Grossly intact, no focal deficits, moving all 4 extremities. Psychiatric: Normal mood and affect.   Laboratory Data: Lab Results  Component Value Date   WBC 4.3 04/10/2018   HGB 12.3 04/10/2018   HCT 36.5 04/10/2018   MCV 89.7 04/10/2018   PLT 112 (L) 04/10/2018    Lab Results  Component Value Date   CREATININE 0.50 04/10/2018    No results found for: PSA  No results found for: TESTOSTERONE  No results found for: HGBA1C  Lab Results  Component Value Date   TSH 1.83 11/25/2015       Component Value Date/Time   CHOL 214 (H) 11/25/2015 1216   HDL 58 11/25/2015 1216   CHOLHDL 3.7 11/25/2015 1216   VLDL 22 11/25/2015 1216   LDLCALC 134 (H) 11/25/2015 1216    Lab Results  Component Value Date   AST 24 04/10/2018   Lab Results  Component Value Date   ALT 23 04/10/2018   No components found for: ALKALINEPHOPHATASE No components found for: BILIRUBINTOTAL  No results found for: ESTRADIOL  Urinalysis    Component Value Date/Time   COLORURINE YELLOW (A) 06/08/2018 2310   APPEARANCEUR CLOUDY (A) 06/08/2018  2310   APPEARANCEUR Clear 04/03/2017 1409   LABSPEC 1.023 06/08/2018 2310   LABSPEC 1.014 02/28/2011 1534   PHURINE 5.0 06/08/2018 2310   GLUCOSEU NEGATIVE 06/08/2018 2310   GLUCOSEU Negative 02/28/2011 1534   HGBUR SMALL (A) 06/08/2018 2310   BILIRUBINUR negative 07/16/2018 1453   BILIRUBINUR Negative 04/03/2017 1409   BILIRUBINUR Negative 02/28/2011 1534   KETONESUR NEGATIVE 06/08/2018 2310   PROTEINUR Positive (A) 07/16/2018 1453   PROTEINUR 100 (A) 06/08/2018 2310   UROBILINOGEN negative (A) 07/16/2018 1453   NITRITE Positive 07/16/2018 1453   NITRITE NEGATIVE 06/08/2018 2310   LEUKOCYTESUR Large (3+) (A) 07/16/2018 1453   LEUKOCYTESUR MODERATE (A) 06/08/2018 2310   LEUKOCYTESUR Trace 02/28/2011 1534    I have reviewed the labs.   Pertinent Imaging: *** I have independently reviewed the films.    Assessment & Plan:  ***  There are no diagnoses linked to this encounter.  No follow-ups on file.  These notes generated with voice recognition software. I apologize for typographical errors.  Zara Council, PA-C  [facility] [address]  [place], [place] [address] [phone]

## 2018-07-30 ENCOUNTER — Ambulatory Visit: Payer: Medicare Other | Admitting: Urology

## 2018-07-31 DIAGNOSIS — Z466 Encounter for fitting and adjustment of urinary device: Secondary | ICD-10-CM | POA: Diagnosis not present

## 2018-07-31 DIAGNOSIS — K592 Neurogenic bowel, not elsewhere classified: Secondary | ICD-10-CM | POA: Diagnosis not present

## 2018-07-31 DIAGNOSIS — G35 Multiple sclerosis: Secondary | ICD-10-CM | POA: Diagnosis not present

## 2018-07-31 DIAGNOSIS — I890 Lymphedema, not elsewhere classified: Secondary | ICD-10-CM | POA: Diagnosis not present

## 2018-07-31 DIAGNOSIS — N319 Neuromuscular dysfunction of bladder, unspecified: Secondary | ICD-10-CM | POA: Diagnosis not present

## 2018-07-31 DIAGNOSIS — G40909 Epilepsy, unspecified, not intractable, without status epilepticus: Secondary | ICD-10-CM | POA: Diagnosis not present

## 2018-08-01 ENCOUNTER — Other Ambulatory Visit: Payer: Self-pay | Admitting: Physical Medicine & Rehabilitation

## 2018-08-05 NOTE — Progress Notes (Signed)
08/06/2018 4:51 PM   Rebecca [name] 1960-06-10 [id number]  Referring provider: [name], MD [address] [place],  [place] [address]  Chief Complaint  Patient presents with  . Follow-up    HPI: Rebecca [name] is a 58 year old female with MS who has a chronic indwelling Foley who presents today to discuss other options.  Neurogenic bladder With MS.  Has had indwelling Foley for several months.  RUS in 05/2018 noted a 6 mm right renal stone.    She is having several issues with her indwelling Foley.  She is having problems with sediment buildup which is causing cathter clogging.  This results in visits to the ED and several visits from home health to change her catheter before the monthly exchange date.  She states her catheters are good for the first two weeks and then they start to cause problems.  Her husband is irrigating the catheter daily with normal saline.    She is also having bladder spasms and leakage.  She feels the catheter is not in the right place.  There was one incident where it was found in the vaginal canal and one incident where the Foley just fell out.    She also has foul smelling urine and cloudy urine frequently.  This is likely due to colonization as she typically will not have symptoms, (no fevers, elevation of BP or gross hematuria).    Today, she feels that the catheter just "doesn't feel right."  Patient denies any gross hematuria, dysuria or suprapubic/flank pain.  Patient denies any fevers, chills, nausea or vomiting.   She states the only way she can tell if she has an infection is if her urine is cloudy or has a strong odor.    PMH: Past Medical History:  Diagnosis Date  . Aortic valve disease    Mild AS / AI - most recent Echo demonstrated tricuspid aortic valve.  . Bacterial endocarditis    History of .  Marland Kitchen Bilateral lower extremity edema    Noncardiac.  Chronic. LE Venous dopplers - negative for DVT.; Echocardiogram January 2016:  Normal EF with normal wall motion and valve function. Only grade 1 diastolic dysfunction. EF 60-65%. Mild MR  . Breast cancer ([place]) 12-31-13   Right breast, 12:00, 1.5 cm, T1c,N0 invasive mammary carcinoma, triple negative. --> Rx with Chemo  . Cervical stenosis of spine   . Herpes zoster   . IBS (irritable bowel syndrome)   . Lymphedema    has legs wrapped at [facility]  . Multiple sclerosis ([place]) 2001   Walks from room to room @ home; but Wheelchair when going out.  Marland Kitchen Neuromuscular disorder ([place])    MS  . Seizures ([place])    Takes Keppra  . Syncope and collapse     Surgical History: Past Surgical History:  Procedure Laterality Date  . ANKLE SURGERY     Left  . ANKLE SURGERY    . ANTERIOR CERVICAL DECOMP/DISCECTOMY FUSION  11/17/2011   Procedure: ANTERIOR CERVICAL DECOMPRESSION/DISCECTOMY FUSION 2 LEVELS;  Surgeon: Erline Levine, MD;  Location: [facility];  Service: Neurosurgery;  Laterality: N/A;  Cervical Five-Six Six-Seven Anterior cervical decompression/diskectomy/fusion  . BREAST BIOPSY Right 12-31-13   invasive mammary  . BREAST SURGERY Right 02/03/2014   Right simple mastectomy with sentinel node biopsy.  . CHOLECYSTECTOMY    . COLONOSCOPY  2014  . Lower extremity venous Dopplers  Feb 27, 2013   No LE DVT  .  MASTECTOMY Right 2015  . Port a cath insertion Right 01/19/2010  . PORT-A-CATH REMOVAL     right  . PORT-A-CATH REMOVAL Right 09/03/2013   Procedure: REMOVAL PORT-A-CATH;  Surgeon: Conrad Anson, MD;  Location: [facility];  Service: Vascular;  Laterality: Right;  . TRANSTHORACIC ECHOCARDIOGRAM  03/2013; 02/2014   a) Normal LV size and function with EF 60-65%.; Cannot exclude bicuspid aortic valve with mild AS and mild AI.; b) Normal EF with normal wall motion and valve function x Mild MR. G2 DD. EF 60-65%. Tricuspid AoV  . UPPER GI ENDOSCOPY  2014    Home Medications:  Allergies as of 08/06/2018      Reactions   Fentanyl Nausea And Vomiting, Nausea Only   vomiting Was  given in PACU x3 each time patient got sick vomiting Was given in PACU x3 each time patient got sick   Sulfa Antibiotics Hives, Other (See Comments)   Light headed, over heated      Medication List       Accurate as of August 06, 2018  4:51 PM. If you have any questions, ask your nurse or doctor.        STOP taking these medications   ciprofloxacin 500 MG tablet Commonly known as: Cipro Stopped by: Arletta Lumadue, PA-C     TAKE these medications   amantadine 100 MG capsule Commonly known as: SYMMETREL Take 1 capsule (100 mg total) by mouth 3 (three) times daily.   Assurance Vinyl Exam Gloves Misc Neurogenic bladder; MS; LON 99 months; for use for personal hygiene   baclofen 10 MG tablet Commonly known as: LIORESAL TAKE 1 TO 2 TABLETS BY MOUTH 3 TIMES A DAY   fluticasone 50 MCG/ACT nasal spray Commonly known as: FLONASE SPRAY 2 SPRAYS INTO EACH NOSTRIL EVERY DAY   glucosamine-chondroitin 500-400 MG tablet Take 1 tablet by mouth 4 (four) times daily.   ibuprofen 600 MG tablet Commonly known as: ADVIL Take 1 tablet (600 mg total) by mouth every 8 (eight) hours as needed.   Interferon Beta-1a 44 MCG/0.5ML Soaj Commonly known as: Rebif Rebidose Inject 0.5 mLs into the skin every Monday, Wednesday, and Friday.   Leg Vein & Circulation Tabs Take 1 tablet by mouth as directed.   levETIRAcetam 500 MG tablet Commonly known as: KEPPRA Take 1 tablet (500 mg total) by mouth 2 (two) times daily.   loratadine 10 MG tablet Commonly known as: CLARITIN Take 10 mg by mouth daily.   MULTIVITAMIN PO Take 1 tablet by mouth daily.   Hair Skin and Nails Formula Tabs Take 1 tablet by mouth daily.   PreserVision AREDS 2 Caps Take 1 capsule by mouth 2 (two) times daily.   oxybutynin 10 MG 24 hr tablet Commonly known as: DITROPAN-XL TAKE 2 TABLETS BY MOUTH EVERY DAY   tiZANidine 4 MG tablet Commonly known as: ZANAFLEX TAKE 1/2 TABLETS (2 MG TOTAL) BY MOUTH 2 (TWO) TIMES  DAILY AS NEEDED (SPASTICITY UNCONTROLLED).   Turmeric 500 MG Caps Take 500 mg by mouth daily.       Allergies:  Allergies  Allergen Reactions  . Fentanyl Nausea And Vomiting and Nausea Only    vomiting Was given in PACU x3 each time patient got sick vomiting Was given in PACU x3 each time patient got sick  . Sulfa Antibiotics Hives and Other (See Comments)    Light headed, over heated    Family History: Family History  Problem Relation Age of Onset  . Cancer Father  skin  . Heart disease Father   . Heart attack Father        heart attack in his 35's  . Thyroid disease Sister   . Ovarian cancer Cousin   . Breast cancer Maternal Aunt 60  . Breast cancer Maternal Grandmother 52  . Bladder Cancer Neg Hx   . Kidney cancer Neg Hx     Social History:  reports that she has never smoked. She has never used smokeless tobacco. She reports that she does not drink alcohol or use drugs.  ROS: UROLOGY Frequent Urination?: Yes Hard to postpone urination?: Yes Burning/pain with urination?: No Get up at night to urinate?: Yes Leakage of urine?: Yes Urine stream starts and stops?: Yes Trouble starting stream?: No Do you have to strain to urinate?: No Blood in urine?: No Urinary tract infection?: No Sexually transmitted disease?: No Injury to kidneys or bladder?: No Painful intercourse?: No Weak stream?: No Currently pregnant?: No Vaginal bleeding?: No Last menstrual period?: n  Gastrointestinal Nausea?: No Vomiting?: No Indigestion/heartburn?: Yes Diarrhea?: No Constipation?: No  Constitutional Fever: No Night sweats?: No Weight loss?: No Fatigue?: Yes  Skin Skin rash/lesions?: No Itching?: No  Eyes Blurred vision?: No Double vision?: No  Ears/Nose/Throat Sore throat?: No Sinus problems?: No  Hematologic/Lymphatic Swollen glands?: No Easy bruising?: Yes  Cardiovascular Leg swelling?: Yes Chest pain?: No  Respiratory Cough?: No Shortness  of breath?: No  Endocrine Excessive thirst?: No  Musculoskeletal Back pain?: No Joint pain?: No  Neurological Headaches?: No Dizziness?: No  Psychologic Depression?: No Anxiety?: No  Physical Exam: BP 137/85 (BP Location: Left Arm, Patient Position: Sitting, Cuff Size: Normal)   Pulse 73   Ht 5\' 4"  (1.626 m)   BMI 37.25 kg/m   Constitutional:  Well nourished. Alert and oriented, No acute distress. HEENT: [place] AT, moist mucus membranes.  Trachea midline, no masses. Cardiovascular: No clubbing, cyanosis, or edema. Respiratory: Normal respiratory effort, no increased work of breathing. GI: Abdomen is soft, non tender, non distended, no abdominal masses. Liver and spleen not palpable.  No hernias appreciated.  Stool sample for occult testing is not indicated.   GU: No CVA tenderness.  No bladder fullness or masses.  Normal external genitalia, sparse pubic hair distribution, no lesions.  Normal urethral meatus, no lesions, no prolapse, no discharge.   No urethral masses, tenderness and/or tenderness. No bladder fullness, tenderness or masses. Normal vagina mucosa, good estrogen effect, no discharge, no lesions, poor pelvic support.   Skin: No rashes, bruises or suspicious lesions. Lymph: Bilateral lower leg lymphedema.  Neurologic: Grossly intact, no focal deficits, moving all 4 extremities. Psychiatric: Normal mood and affect.   Laboratory Data: Lab Results  Component Value Date   WBC 4.3 04/10/2018   HGB 12.3 04/10/2018   HCT 36.5 04/10/2018   MCV 89.7 04/10/2018   PLT 112 (L) 04/10/2018    Lab Results  Component Value Date   CREATININE 0.50 04/10/2018    No results found for: PSA  No results found for: TESTOSTERONE  No results found for: HGBA1C  Lab Results  Component Value Date   TSH 1.83 11/25/2015       Component Value Date/Time   CHOL 214 (H) 11/25/2015 1216   HDL 58 11/25/2015 1216   CHOLHDL 3.7 11/25/2015 1216   VLDL 22 11/25/2015 1216   LDLCALC 134  (H) 11/25/2015 1216    Lab Results  Component Value Date   AST 24 04/10/2018   Lab Results  Component Value  Date   ALT 23 04/10/2018   No components found for: ALKALINEPHOPHATASE No components found for: BILIRUBINTOTAL  No results found for: ESTRADIOL  Urinalysis    Component Value Date/Time   COLORURINE YELLOW (A) 06/08/2018 2310   APPEARANCEUR CLOUDY (A) 06/08/2018 2310   APPEARANCEUR Clear 04/03/2017 1409   LABSPEC 1.023 06/08/2018 2310   LABSPEC 1.014 02/28/2011 1534   PHURINE 5.0 06/08/2018 2310   GLUCOSEU NEGATIVE 06/08/2018 2310   GLUCOSEU Negative 02/28/2011 1534   HGBUR SMALL (A) 06/08/2018 2310   BILIRUBINUR negative 07/16/2018 1453   BILIRUBINUR Negative 04/03/2017 1409   BILIRUBINUR Negative 02/28/2011 [place] 06/08/2018 2310   PROTEINUR Positive (A) 07/16/2018 1453   PROTEINUR 100 (A) 06/08/2018 2310   UROBILINOGEN negative (A) 07/16/2018 1453   NITRITE Positive 07/16/2018 1453   NITRITE NEGATIVE 06/08/2018 2310   LEUKOCYTESUR Large (3+) (A) 07/16/2018 1453   LEUKOCYTESUR MODERATE (A) 06/08/2018 2310   LEUKOCYTESUR Trace 02/28/2011 1534    I have reviewed the labs.  Cath Change/ Replacement Patient is present today for a catheter change due to urinary retention.  10 ml of water was removed from the balloon, a 16 FR foley cath was removed with out difficulty.  Patient was cleaned and prepped in a sterile fashion with betadine and 2% lidocaine jelly was instilled into the urethra. A 16 FR foley cath was replaced into the bladder no complications were noted Urine return was noted 10 ml and urine was yellow clear in color. The balloon was filled with 8ml of sterile water. An overnight bag was attached for drainage.  A night bag was also given to the patient and patient was given instruction on how to change from one bag to another. Patient was given proper instruction on catheter care.    Performed by: Thomas Hoff, PA-C  Pertinent  Imaging: CLINICAL DATA:  Recurrent urinary tract infection  EXAM: RENAL / URINARY TRACT ULTRASOUND COMPLETE  COMPARISON:  None.  FINDINGS: Right Kidney:  Renal measurements: 11.2 x 5.4 x 4.5 cm = volume: 143 mL. 6 mm nonobstructive calculus is noted in lower pole collecting system. Echogenicity within normal limits. No mass or hydronephrosis visualized.  Left Kidney:  Renal measurements: 10.9 x 5.6 x 5.3 cm = volume: 169 mL. Echogenicity within normal limits. No mass or hydronephrosis visualized.  Bladder:  Decompressed secondary to Foley catheter.  IMPRESSION: Small nonobstructive right renal calculus. No hydronephrosis or renal obstruction is noted. No other renal abnormality is noted.   Electronically Signed   By: Marijo Conception M.D.   On: 06/01/2018 11:55    Assessment & Plan:    1. Urinary retention At this time, there is question as to whether or not the Foley has been placed accurately by home health.  Her anatomy is not conducive to a straight forward catheter placement.  We discussed that if the catheter was not placed correctly, this may be the reason for the bladder spasms and catheter malfunctioning.  Therefore at this time, we will hold on the SPT placement and have her come to the office for Foley catheter exchanges.  If she continues to have bladder spasms and/or sediment/catheter malfunction with our in-office exchanges, I explained that the SPT will not prevent these things from happening.   We will discontinue home health catheter exchanges and she will come to the office for Foley catheter exchanges  2. Colonization of urine Patient and husband have a limited understanding of UTI vs colonizations.  Myself  and staff have had several conversations regarding this issue (see previous notes).  Her urine only needs to have an UA and/or urine culture if she is having symptoms of gross hematuria, fevers/chills, nausea/vomiting, elevation in BP and/or  pain.     Return in about 1 month (around 09/05/2018) for Foley exchange .  These notes generated with voice recognition software. I apologize for typographical errors.  Zara Council, PA-C  [facility] [address]  [place], [place] [address] [phone]

## 2018-08-06 ENCOUNTER — Ambulatory Visit (INDEPENDENT_AMBULATORY_CARE_PROVIDER_SITE_OTHER): Payer: Medicare Other | Admitting: Urology

## 2018-08-06 ENCOUNTER — Encounter: Payer: Self-pay | Admitting: Urology

## 2018-08-06 ENCOUNTER — Other Ambulatory Visit: Payer: Self-pay

## 2018-08-06 VITALS — BP 137/85 | HR 73 | Ht 64.0 in

## 2018-08-06 DIAGNOSIS — Z229 Carrier of infectious disease, unspecified: Secondary | ICD-10-CM

## 2018-08-06 DIAGNOSIS — G35 Multiple sclerosis: Secondary | ICD-10-CM | POA: Diagnosis not present

## 2018-08-06 DIAGNOSIS — R338 Other retention of urine: Secondary | ICD-10-CM | POA: Diagnosis not present

## 2018-08-06 DIAGNOSIS — Z87898 Personal history of other specified conditions: Secondary | ICD-10-CM | POA: Diagnosis not present

## 2018-08-07 ENCOUNTER — Telehealth: Payer: Self-pay | Admitting: Urology

## 2018-08-07 NOTE — Telephone Encounter (Signed)
Per patient she clarified she usually gets loss of smell with UTI symptoms but does not have any other symptoms or signs. Patient is aware to monitor her symptoms and contact the office with any changes.

## 2018-08-07 NOTE — Telephone Encounter (Signed)
Ubaldo Glassing is calling from [employer] and would like to speak to you about Ms. Key.  Please call Alexis at [phone].

## 2018-08-07 NOTE — Telephone Encounter (Signed)
Is she saying she has loses her sense of smell when she has an UTI?  That is not a sign of an UTI, but it may be a sign of COVID-19.  We need clarification regarding this symptom.  Outside of that, her urine was clear yesterday.  If she is not having gross hematuria, fevers/chills, nausea/vomitng or elevation in BP, she does not have an UTI.

## 2018-08-07 NOTE — Telephone Encounter (Signed)
Patient forgot to mention that she wanted to be tested for UTI. She doesn't usually have any noticeable symptoms. She said she just cannot smell, which is one of her signs. Denies fevers or body aches. She was tested for a UA not culture on 6/06 but it was not cultured and she forgot to mention yesterday. Please advise.

## 2018-08-07 NOTE — Telephone Encounter (Signed)
Would you call [facility] and discontinue her Foley catheter exchanges at home?  She will have them exchanged in the office going forward.

## 2018-08-07 NOTE — Telephone Encounter (Signed)
I spoke with Ubaldo Glassing and answered her questions regarding Mrs. Keim's care.  I expressed my concern that Mrs. Rotan cannot ambulate as it took three persons in our office yesterday to transfer her to the low bed exam table and three persons to transfer her back to her wheelchair.  I also agreed with Ubaldo Glassing that Mr. Fair may not be able to provide the 24 hour care she needs at this time and would recommend at the very least to have daily visit with home health nurse.

## 2018-08-07 NOTE — Telephone Encounter (Signed)
Pt said she was seen in office yesterday and forgot to ask a couple of questions. Asks to have someone call her to answer her questions. Please advise. Thanks.

## 2018-08-07 NOTE — Telephone Encounter (Signed)
Spoke with Roselyn Reef at [facility] discontinue orders.

## 2018-08-08 DIAGNOSIS — Z466 Encounter for fitting and adjustment of urinary device: Secondary | ICD-10-CM | POA: Diagnosis not present

## 2018-08-08 DIAGNOSIS — K592 Neurogenic bowel, not elsewhere classified: Secondary | ICD-10-CM | POA: Diagnosis not present

## 2018-08-08 DIAGNOSIS — N319 Neuromuscular dysfunction of bladder, unspecified: Secondary | ICD-10-CM | POA: Diagnosis not present

## 2018-08-08 DIAGNOSIS — G35 Multiple sclerosis: Secondary | ICD-10-CM | POA: Diagnosis not present

## 2018-08-08 DIAGNOSIS — G40909 Epilepsy, unspecified, not intractable, without status epilepticus: Secondary | ICD-10-CM | POA: Diagnosis not present

## 2018-08-08 DIAGNOSIS — I890 Lymphedema, not elsewhere classified: Secondary | ICD-10-CM | POA: Diagnosis not present

## 2018-08-14 ENCOUNTER — Encounter: Payer: Medicare Other | Admitting: Obstetrics & Gynecology

## 2018-08-22 ENCOUNTER — Emergency Department
Admission: EM | Admit: 2018-08-22 | Discharge: 2018-08-22 | Disposition: A | Discharge status: Home / Self Care | Payer: Medicare Other | Attending: Emergency Medicine | Admitting: Emergency Medicine

## 2018-08-22 ENCOUNTER — Encounter: Payer: Medicare Other | Admitting: Physical Medicine & Rehabilitation

## 2018-08-22 ENCOUNTER — Other Ambulatory Visit: Payer: Self-pay

## 2018-08-22 DIAGNOSIS — T83098A Other mechanical complication of other indwelling urethral catheter, initial encounter: Secondary | ICD-10-CM | POA: Diagnosis not present

## 2018-08-22 DIAGNOSIS — Z853 Personal history of malignant neoplasm of breast: Secondary | ICD-10-CM | POA: Insufficient documentation

## 2018-08-22 DIAGNOSIS — Z79899 Other long term (current) drug therapy: Secondary | ICD-10-CM | POA: Insufficient documentation

## 2018-08-22 DIAGNOSIS — G35 Multiple sclerosis: Secondary | ICD-10-CM

## 2018-08-22 DIAGNOSIS — Z7401 Bed confinement status: Secondary | ICD-10-CM | POA: Diagnosis not present

## 2018-08-22 DIAGNOSIS — T83091A Other mechanical complication of indwelling urethral catheter, initial encounter: Secondary | ICD-10-CM | POA: Diagnosis not present

## 2018-08-22 DIAGNOSIS — Y738 Miscellaneous gastroenterology and urology devices associated with adverse incidents, not elsewhere classified: Secondary | ICD-10-CM | POA: Diagnosis not present

## 2018-08-22 DIAGNOSIS — N3090 Cystitis, unspecified without hematuria: Secondary | ICD-10-CM

## 2018-08-22 DIAGNOSIS — R0689 Other abnormalities of breathing: Secondary | ICD-10-CM | POA: Diagnosis not present

## 2018-08-22 DIAGNOSIS — Z9011 Acquired absence of right breast and nipple: Secondary | ICD-10-CM | POA: Insufficient documentation

## 2018-08-22 DIAGNOSIS — M2550 Pain in unspecified joint: Secondary | ICD-10-CM | POA: Diagnosis not present

## 2018-08-22 DIAGNOSIS — R5381 Other malaise: Secondary | ICD-10-CM | POA: Diagnosis not present

## 2018-08-22 DIAGNOSIS — R0902 Hypoxemia: Secondary | ICD-10-CM | POA: Diagnosis not present

## 2018-08-22 DIAGNOSIS — T839XXA Unspecified complication of genitourinary prosthetic device, implant and graft, initial encounter: Secondary | ICD-10-CM

## 2018-08-22 LAB — URINALYSIS, COMPLETE (UACMP) WITH MICROSCOPIC
Bilirubin Urine: NEGATIVE
Glucose, UA: NEGATIVE mg/dL
Ketones, ur: NEGATIVE mg/dL
Nitrite: NEGATIVE
Protein, ur: NEGATIVE mg/dL
Specific Gravity, Urine: 1.005 (ref 1.005–1.030)
pH: 7 (ref 5.0–8.0)

## 2018-08-22 MED ORDER — CEFDINIR 300 MG PO CAPS: 300.0000 mg | ORAL_CAPSULE | Freq: Two times a day (BID) | ORAL | 0 refills | Status: DC

## 2018-08-22 MED ORDER — SODIUM CHLORIDE 0.9 % IV SOLN
1.0000 g | Freq: Once | INTRAVENOUS | Status: AC
  Administered 2018-08-22: 1 g via INTRAVENOUS
  Filled 2018-08-22: qty 10

## 2018-08-22 NOTE — ED Notes (Signed)
Old catheter removed, new placed.

## 2018-08-22 NOTE — ED Triage Notes (Signed)
C/o urethral catheter clogged today. Chronic catheter. States she has had it for approx 2 weeks. Denies complaints. Pt alert and oriented X4, active, cooperative, pt in NAD. RR even and unlabored, color WNL.

## 2018-08-22 NOTE — ED Notes (Signed)
EMS here to transport patient home, attempted to call husband again with no answer. Patient stated understanding of discharge instructions and follow up care. New foley catheter in place

## 2018-08-22 NOTE — ED Notes (Signed)
Attempted to reach patient's husband to notify him of discharge, no answer at either number. Will attempt again

## 2018-08-22 NOTE — ED Notes (Signed)
Catheter filled with thick brown sediment, little urine in bag.

## 2018-08-22 NOTE — Discharge Instructions (Signed)
Your urine test is concerning for a repeat urinary tract infection today.  We sent a urine culture.  Take Omnicef as prescribed for the next week, and follow-up with your urologist in 1 to 2 weeks for reassessment.

## 2018-08-22 NOTE — ED Provider Notes (Signed)
[facility] Emergency Department Provider Note  ____________________________________________  Time seen: Approximately 6:43 PM  I have reviewed the triage vital signs and the nursing notes.   HISTORY  Chief Complaint Other (Urinary Catheter Problem)    HPI Rebecca [name] is a 58 y.o. female with a history of peripheral edema, multiple sclerosis with neurogenic bladder who comes the ED complaining of Foley catheter obstruction.  This catheter was placed about 2 weeks ago.  Over the past week she has had increasingly copious brown sediment in her urine, and today she noticed that the Foley catheter was not draining at all and she kept having a worsening feeling of abdominal fullness that she attributes to bladder distention.  She also had leaking of urine around the catheter.  Denies fever chills chest pain or shortness of breath.  Pain is nonradiating, no aggravating or alleviating factors.  She reports a history of recurrent UTIs but is not on daily prophylactic antibiotics.  She sees urology who placed the current Foley catheter.      Past Medical History:  Diagnosis Date  . Aortic valve disease    Mild AS / AI - most recent Echo demonstrated tricuspid aortic valve.  . Bacterial endocarditis    History of .  Marland Kitchen Bilateral lower extremity edema    Noncardiac.  Chronic. LE Venous dopplers - negative for DVT.; Echocardiogram January 2016: Normal EF with normal wall motion and valve function. Only grade 1 diastolic dysfunction. EF 60-65%. Mild MR  . Breast cancer ([place]) 12-31-13   Right breast, 12:00, 1.5 cm, T1c,N0 invasive mammary carcinoma, triple negative. --> Rx with Chemo  . Cervical stenosis of spine   . Herpes zoster   . IBS (irritable bowel syndrome)   . Lymphedema    has legs wrapped at [facility]  . Multiple sclerosis ([place]) 2001   Walks from room to room @ home; but Wheelchair when going out.  Marland Kitchen Neuromuscular disorder ([place])    MS  . Seizures ([place])    Takes Keppra  . Syncope and collapse      Patient Active Problem List   Diagnosis Date Noted  . Ambulatory dysfunction 02/02/2018  . Abnormality of gait 11/17/2017  . 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 Bilateral lower extremity pain (primary) (bilateral) (right greater than left) 08/01/2016  . Chronic neck pain (secondary) (bilateral) ( left greater than right) 07/28/2016  . Long term current use of opiate analgesic 07/28/2016  . Long term prescription opiate use 07/28/2016  . Opiate use 07/28/2016  . Neutropenia ([place]) 06/27/2016  . Chronic pain syndrome 06/03/2016  . Adjustment disorder with mixed anxiety and depressed mood   . Neurogenic bladder   . Neurogenic bowel   . Detrusor and sphincter dyssynergia   . Urinary incontinence   . Seizure disorder ([place])   . Slow transit constipation   . 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[place])   . Lymphedema   . Hypoalbuminemia due to protein-calorie malnutrition ([place])   . Encounter for screening for cervical cancer  11/27/2015  . Preventative health care 11/25/2015  . History of breast cancer in female 10/28/2015  . Anemia 10/28/2015  . Thrombocytopenia ([place]) 10/28/2015  . Allergic rhinitis 10/28/2015  . IBS (irritable bowel syndrome) 10/28/2015  . 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 History of right mastectomy 10/24/2014  . Medicare annual wellness visit, subsequent 10/24/2014  . MS (multiple sclerosis) ([place]) 09/16/2014  . Hematoma complicating a procedure 03/06/2014  . Primary cancer of right female  breast ([place]) 01/10/2014  . SOB (shortness of breath) 03/01/2013  . Bilateral lower extremity edema   . 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 ANKLE SURGERY     Left  . ANKLE SURGERY    . ANTERIOR CERVICAL DECOMP/DISCECTOMY FUSION  11/17/2011   Procedure: ANTERIOR CERVICAL DECOMPRESSION/DISCECTOMY FUSION 2 LEVELS;  Surgeon: Erline Levine, MD;   Location: [facility];  Service: Neurosurgery;  Laterality: N/A;  Cervical Five-Six Six-Seven Anterior cervical decompression/diskectomy/fusion  . BREAST BIOPSY Right 12-31-13   invasive mammary  . BREAST SURGERY Right 02/03/2014   Right simple mastectomy with sentinel node biopsy.  . CHOLECYSTECTOMY    . COLONOSCOPY  2014  . Lower extremity venous Dopplers  Feb 27, 2013   No LE DVT  . MASTECTOMY Right 2015  . Port a cath insertion Right 01/19/2010  . PORT-A-CATH REMOVAL     right  . PORT-A-CATH REMOVAL Right 09/03/2013   Procedure: REMOVAL PORT-A-CATH;  Surgeon: Conrad What Cheer, MD;  Location: [facility];  Service: Vascular;  Laterality: Right;  . TRANSTHORACIC ECHOCARDIOGRAM  03/2013; 02/2014   a) Normal LV size and function with EF 60-65%.; Cannot exclude bicuspid aortic valve with mild AS and mild AI.; b) Normal EF with normal wall motion and valve function x Mild MR. G2 DD. EF 60-65%. Tricuspid AoV  . UPPER GI ENDOSCOPY  2014     Prior to Admission medications   Medication Sig Start Date End Date Taking? Authorizing Provider  amantadine (SYMMETREL) 100 MG capsule Take 1 capsule (100 mg total) by mouth 3 (three) times daily. 06/05/18   Penumalli, Earlean Polka, MD  baclofen (LIORESAL) 10 MG tablet TAKE 1 TO 2 TABLETS BY MOUTH 3 TIMES A DAY 03/20/18   Penumalli, Earlean Polka, MD  cefdinir (OMNICEF) 300 MG capsule Take 1 capsule (300 mg total) by mouth 2 (two) times daily. 08/22/18   Carrie Mew, MD  Disposable Gloves (ASSURANCE VINYL EXAM GLOVES) MISC Neurogenic bladder; MS; LON 99 months; for use for personal hygiene 12/26/17   Lada, Satira Anis, MD  fluticasone (FLONASE) 50 MCG/ACT nasal spray SPRAY 2 SPRAYS INTO EACH NOSTRIL EVERY DAY 07/13/18   [provider]  glucosamine-chondroitin 500-400 MG tablet Take 1 tablet by mouth 4 (four) times daily.    [provider]  ibuprofen (ADVIL,MOTRIN) 600 MG tablet Take 1 tablet (600 mg total) by mouth every 8 (eight) hours as needed. 04/18/18    [name], MD  Interferon Beta-1a (REBIF REBIDOSE) 44 MCG/0.5ML SOAJ Inject 0.5 mLs into the skin every Monday, Wednesday, and Friday. 03/21/18   Penumalli, Earlean Polka, MD  levETIRAcetam (KEPPRA) 500 MG tablet Take 1 tablet (500 mg total) by mouth 2 (two) times daily. 06/05/18   Penumalli, Earlean Polka, MD  loratadine (CLARITIN) 10 MG tablet Take 10 mg by mouth daily.    [provider]  Misc Natural Products (LEG VEIN & CIRCULATION) TABS Take 1 tablet by mouth as directed.    [provider]  Multiple Vitamins-Minerals (HAIR SKIN AND NAILS FORMULA) TABS Take 1 tablet by mouth daily.     [provider]  Multiple Vitamins-Minerals (MULTIVITAMIN PO) Take 1 tablet by mouth daily.     [provider]  Multiple Vitamins-Minerals (PRESERVISION AREDS 2) CAPS Take 1 capsule by mouth 2 (two) times daily.    [provider]  oxybutynin (DITROPAN-XL) 10 MG 24 hr tablet TAKE 2 TABLETS BY MOUTH EVERY  DAY 04/20/18   Penumalli, Earlean Polka, MD  tiZANidine (ZANAFLEX) 4 MG tablet TAKE 1/2 TABLETS (2 MG TOTAL) BY MOUTH 2 (TWO) TIMES DAILY AS NEEDED (SPASTICITY UNCONTROLLED). 08/01/18   Jamse Arn, MD  Turmeric 500 MG CAPS Take 500 mg by mouth daily.    [provider]     Allergies Fentanyl and Sulfa antibiotics   Family History  Problem Relation Age of Onset  . Cancer Father        skin  . Heart disease Father   . Heart attack Father        heart attack in his 93's  . Thyroid disease Sister   . Ovarian cancer Cousin   . Breast cancer Maternal Aunt 60  . Breast cancer Maternal Grandmother 79  . Bladder Cancer Neg Hx   . Kidney cancer Neg Hx     Social History Social History   Tobacco Use  . Smoking status: Never Smoker  . Smokeless tobacco: Never Used  Substance Use Topics  . Alcohol use: No  . Drug use: No    Review of Systems  Constitutional:   No fever or chills.  ENT:   No sore throat. No rhinorrhea. Cardiovascular:   No chest  pain or syncope. Respiratory:   No dyspnea or cough. Gastrointestinal:   Positive as above for suprapubic pain without vomiting and diarrhea.  Musculoskeletal:   Negative for focal pain or swelling All other systems reviewed and are negative except as documented above in ROS and HPI.  ____________________________________________   PHYSICAL EXAM:  VITAL SIGNS: ED Triage Vitals [08/22/18 1807]  Enc Vitals Group     BP 125/84     Pulse Rate 86     Resp 18     Temp 98 F (36.7 C)     Temp Source Oral     SpO2 100 %     Weight 217 lb (98.4 kg)     Height 5\' 4"  (1.626 m)     Head Circumference      Peak Flow      Pain Score 0     Pain Loc      Pain Edu?      Excl. in [place]?     Vital signs reviewed, nursing assessments reviewed.   Constitutional:   Alert and oriented. Non-toxic appearance. Eyes:   Conjunctivae are normal. EOMI. PERRL. ENT      Head:   Normocephalic and atraumatic.      Nose:   No congestion/rhinnorhea.       Mouth/Throat:   MMM, no pharyngeal erythema. No peritonsillar mass.       Neck:   No meningismus. Full ROM. Hematological/Lymphatic/Immunilogical:   No cervical lymphadenopathy. Cardiovascular:   RRR. Symmetric bilateral radial and DP pulses.  No murmurs. Cap refill less than 2 seconds. Respiratory:   Normal respiratory effort without tachypnea/retractions. Breath sounds are clear and equal bilaterally. No wheezes/rales/rhonchi. Gastrointestinal:   Soft with suprapubic fullness.  No focal tenderness. There is no CVA tenderness.  No rebound, rigidity, or guarding.  Musculoskeletal:   Normal range of motion in all extremities. No joint effusions.  No lower extremity tenderness.  No edema. Neurologic:   Normal speech and language.  Motor grossly intact. No acute focal neurologic deficits are appreciated.  Skin:    Skin is warm, dry and intact. No rash noted.  No petechiae, purpura, or bullae.  ____________________________________________    LABS  (pertinent positives/negatives) (all labs ordered are listed, but  only abnormal results are displayed) Labs Reviewed  URINALYSIS, COMPLETE (UACMP) WITH MICROSCOPIC - Abnormal; Notable for the following components:      Result Value   Color, Urine STRAW (*)    APPearance HAZY (*)    Hgb urine dipstick MODERATE (*)    Leukocytes,Ua LARGE (*)    Bacteria, UA RARE (*)    All other components within normal limits  URINE CULTURE   ____________________________________________   EKG    ____________________________________________    RADIOLOGY  No results found.  ____________________________________________   PROCEDURES Procedures  ____________________________________________    CLINICAL IMPRESSION / ASSESSMENT AND PLAN / ED COURSE  Medications ordered in the ED: Medications  cefTRIAXone (ROCEPHIN) 1 g in sodium chloride 0.9 % 100 mL IVPB (0 g Intravenous Stopped 08/22/18 1954)    Pertinent labs & imaging results that were available during my care of the patient were reviewed by me and considered in my medical decision making (see chart for details).  Rebecca [name] was evaluated in Emergency Department on 08/22/2018 for the symptoms described in the history of present illness. She was evaluated in the context of the global COVID-19 pandemic, which necessitated consideration that the patient might be at risk for infection with the SARS-CoV-2 virus that causes COVID-19. Institutional protocols and algorithms that pertain to the evaluation of patients at risk for COVID-19 are in a state of rapid change based on information released by regulatory bodies including the CDC and federal and state organizations. These policies and algorithms were followed during the patient's care in the ED.   Patient presents clinically with a Foley catheter obstruction.  We will replace it, flush her bladder out, send a urinalysis and culture.  Recommend she follow-up with her urologist again in the  next 1 to 2 weeks for further assessment and consideration of prophylactic antibiotics as needed.  Prior urine cultures show Serratia sensitive to ceftriaxone and Cipro, and Morganella sensitive to ceftriaxone, Cipro, Bactrim.  She was recently on Cipro about a month ago, so if she has evidence of UTI today I will start her on a third-generation cephalosporin.  Clinical Course as of Aug 22 2023  Wed Aug 22, 2018  1913 Consistent with developing UTI.  Prior UTIs have also had rare bacteria on microscopy.  I will start her on Omnicef.  Urine culture pending.  Stable for discharge home.  Urinalysis, Complete w Microscopic(!) [PS]    Clinical Course User Index [PS] Carrie Mew, MD     ____________________________________________   FINAL CLINICAL IMPRESSION(S) / ED DIAGNOSES    Final diagnoses:  Foley catheter problem, initial encounter ([place])  Multiple sclerosis ([place])  Cystitis     ED Discharge Orders         Ordered    cefdinir (OMNICEF) 300 MG capsule  2 times daily     08/22/18 1910          Portions of this note were generated with dragon dictation software. Dictation errors may occur despite best attempts at proofreading.   Carrie Mew, MD 08/22/18 2025

## 2018-08-23 ENCOUNTER — Telehealth: Payer: Self-pay | Admitting: Urology

## 2018-08-23 NOTE — Telephone Encounter (Signed)
Spoke with patient wanted to keep 09/06/2018 appointment for nurse due to cath change complications. Will call if she wants to change appointment or any complications.

## 2018-08-23 NOTE — Telephone Encounter (Signed)
Pt. States she went to ER yesterday 08/22/18 and had her catheter changed and was told to F/U in one week with urology I told her she has an appt. For her change on 09/21/18. And clinical will call her if the appt. Needs to be sooner.

## 2018-08-24 LAB — URINE CULTURE: Culture: >=100000 — AB

## 2018-08-27 ENCOUNTER — Telehealth: Payer: Self-pay | Admitting: Urology

## 2018-08-27 ENCOUNTER — Other Ambulatory Visit: Payer: Self-pay | Admitting: Urology

## 2018-08-27 DIAGNOSIS — N390 Urinary tract infection, site not specified: Secondary | ICD-10-CM

## 2018-08-27 NOTE — Telephone Encounter (Signed)
Rebecca [name] was seen in the ED last week and grew out multidrug resistant organisms.  She was placed on Ceftin.  It is likely this is a positive urine culture due to colonization and not an UTI.  We do need to have her see ID and we need to reach out to her to see if she is having symptoms of an UTI at this time.

## 2018-08-28 NOTE — Telephone Encounter (Signed)
Patient notified she states she is doing much better now no symptoms

## 2018-09-04 ENCOUNTER — Inpatient Hospital Stay: Payer: Medicare Other | Attending: Oncology

## 2018-09-04 ENCOUNTER — Other Ambulatory Visit: Payer: Self-pay

## 2018-09-04 ENCOUNTER — Ambulatory Visit: Payer: Medicare Other | Admitting: Infectious Diseases

## 2018-09-04 DIAGNOSIS — Z452 Encounter for adjustment and management of vascular access device: Secondary | ICD-10-CM | POA: Diagnosis not present

## 2018-09-04 DIAGNOSIS — Z95828 Presence of other vascular implants and grafts: Secondary | ICD-10-CM

## 2018-09-04 DIAGNOSIS — G629 Polyneuropathy, unspecified: Secondary | ICD-10-CM | POA: Insufficient documentation

## 2018-09-04 DIAGNOSIS — M549 Dorsalgia, unspecified: Secondary | ICD-10-CM | POA: Insufficient documentation

## 2018-09-04 DIAGNOSIS — D696 Thrombocytopenia, unspecified: Secondary | ICD-10-CM | POA: Diagnosis not present

## 2018-09-04 DIAGNOSIS — Z808 Family history of malignant neoplasm of other organs or systems: Secondary | ICD-10-CM | POA: Insufficient documentation

## 2018-09-04 DIAGNOSIS — G35 Multiple sclerosis: Secondary | ICD-10-CM | POA: Diagnosis not present

## 2018-09-04 DIAGNOSIS — I890 Lymphedema, not elsewhere classified: Secondary | ICD-10-CM | POA: Diagnosis not present

## 2018-09-04 DIAGNOSIS — G40909 Epilepsy, unspecified, not intractable, without status epilepticus: Secondary | ICD-10-CM | POA: Insufficient documentation

## 2018-09-04 DIAGNOSIS — Z803 Family history of malignant neoplasm of breast: Secondary | ICD-10-CM | POA: Diagnosis not present

## 2018-09-04 DIAGNOSIS — Z8349 Family history of other endocrine, nutritional and metabolic diseases: Secondary | ICD-10-CM | POA: Diagnosis not present

## 2018-09-04 DIAGNOSIS — Z8041 Family history of malignant neoplasm of ovary: Secondary | ICD-10-CM | POA: Insufficient documentation

## 2018-09-04 DIAGNOSIS — R531 Weakness: Secondary | ICD-10-CM | POA: Insufficient documentation

## 2018-09-04 DIAGNOSIS — Z853 Personal history of malignant neoplasm of breast: Secondary | ICD-10-CM | POA: Insufficient documentation

## 2018-09-04 DIAGNOSIS — Z79899 Other long term (current) drug therapy: Secondary | ICD-10-CM | POA: Diagnosis not present

## 2018-09-04 DIAGNOSIS — M7989 Other specified soft tissue disorders: Secondary | ICD-10-CM | POA: Diagnosis not present

## 2018-09-04 DIAGNOSIS — Z8249 Family history of ischemic heart disease and other diseases of the circulatory system: Secondary | ICD-10-CM | POA: Diagnosis not present

## 2018-09-04 DIAGNOSIS — Z923 Personal history of irradiation: Secondary | ICD-10-CM | POA: Diagnosis not present

## 2018-09-04 MED ORDER — SODIUM CHLORIDE 0.9% FLUSH
10.0000 mL | Freq: Once | INTRAVENOUS | Status: AC
  Administered 2018-09-04: 10 mL via INTRAVENOUS
  Filled 2018-09-04: qty 10

## 2018-09-04 MED ORDER — HEPARIN SOD (PORK) LOCK FLUSH 100 UNIT/ML IV SOLN
500.0000 [IU] | Freq: Once | INTRAVENOUS | Status: AC
  Administered 2018-09-04: 500 [IU] via INTRAVENOUS

## 2018-09-06 ENCOUNTER — Telehealth: Payer: Self-pay | Admitting: Urology

## 2018-09-06 ENCOUNTER — Ambulatory Visit: Payer: Self-pay

## 2018-09-06 ENCOUNTER — Ambulatory Visit
Admission: RE | Admit: 2018-09-06 | Discharge: 2018-09-06 | Disposition: A | Discharge status: Home / Self Care | Payer: Medicare Other | Source: Ambulatory Visit | Attending: Oncology | Admitting: Oncology

## 2018-09-06 ENCOUNTER — Ambulatory Visit: Payer: Medicare Other

## 2018-09-06 DIAGNOSIS — Z1231 Encounter for screening mammogram for malignant neoplasm of breast: Secondary | ICD-10-CM | POA: Diagnosis not present

## 2018-09-06 DIAGNOSIS — C50911 Malignant neoplasm of unspecified site of right female breast: Secondary | ICD-10-CM

## 2018-09-06 DIAGNOSIS — Z853 Personal history of malignant neoplasm of breast: Secondary | ICD-10-CM | POA: Diagnosis not present

## 2018-09-06 NOTE — Telephone Encounter (Signed)
Pt states that she would like to come in and have her cath looked at ASAP, she states that she can't feel it anymore, also she wants to make sure her Husband can come with her. Please advise.

## 2018-09-06 NOTE — Telephone Encounter (Signed)
Pt called back and states that she does not want to come in now until her appt on 09/21/2018.

## 2018-09-07 ENCOUNTER — Emergency Department
Admission: EM | Admit: 2018-09-07 | Discharge: 2018-09-07 | Disposition: A | Discharge status: Home / Self Care | Payer: Medicare Other | Attending: Student in an Organized Health Care Education/Training Program | Admitting: Student in an Organized Health Care Education/Training Program

## 2018-09-07 ENCOUNTER — Ambulatory Visit: Payer: Medicare Other | Admitting: Physical Medicine & Rehabilitation

## 2018-09-07 ENCOUNTER — Other Ambulatory Visit: Payer: Self-pay

## 2018-09-07 DIAGNOSIS — R279 Unspecified lack of coordination: Secondary | ICD-10-CM | POA: Diagnosis not present

## 2018-09-07 DIAGNOSIS — T83091A Other mechanical complication of indwelling urethral catheter, initial encounter: Secondary | ICD-10-CM | POA: Insufficient documentation

## 2018-09-07 DIAGNOSIS — G40209 Localization-related (focal) (partial) symptomatic epilepsy and epileptic syndromes with complex partial seizures, not intractable, without status epilepticus: Secondary | ICD-10-CM | POA: Diagnosis not present

## 2018-09-07 DIAGNOSIS — R5381 Other malaise: Secondary | ICD-10-CM | POA: Diagnosis not present

## 2018-09-07 DIAGNOSIS — G114 Hereditary spastic paraplegia: Secondary | ICD-10-CM | POA: Insufficient documentation

## 2018-09-07 DIAGNOSIS — T839XXA Unspecified complication of genitourinary prosthetic device, implant and graft, initial encounter: Secondary | ICD-10-CM

## 2018-09-07 DIAGNOSIS — Y828 Other medical devices associated with adverse incidents: Secondary | ICD-10-CM | POA: Diagnosis not present

## 2018-09-07 DIAGNOSIS — T83098A Other mechanical complication of other indwelling urethral catheter, initial encounter: Secondary | ICD-10-CM | POA: Diagnosis not present

## 2018-09-07 DIAGNOSIS — T83198A Other mechanical complication of other urinary devices and implants, initial encounter: Secondary | ICD-10-CM | POA: Diagnosis not present

## 2018-09-07 DIAGNOSIS — Z79899 Other long term (current) drug therapy: Secondary | ICD-10-CM | POA: Insufficient documentation

## 2018-09-07 DIAGNOSIS — Z743 Need for continuous supervision: Secondary | ICD-10-CM | POA: Diagnosis not present

## 2018-09-07 DIAGNOSIS — G35 Multiple sclerosis: Secondary | ICD-10-CM | POA: Insufficient documentation

## 2018-09-07 LAB — URINALYSIS, COMPLETE (UACMP) WITH MICROSCOPIC
Bilirubin Urine: NEGATIVE
Glucose, UA: NEGATIVE mg/dL
Hgb urine dipstick: NEGATIVE
Ketones, ur: NEGATIVE mg/dL
Nitrite: POSITIVE — AB
Protein, ur: 100 mg/dL — AB
Specific Gravity, Urine: 1.024 (ref 1.005–1.030)
Squamous Epithelial / LPF: NONE SEEN (ref 0–5)
WBC, UA: >50 WBC/hpf — ABNORMAL HIGH (ref 0–5)
pH: 6 (ref 5.0–8.0)

## 2018-09-07 NOTE — ED Triage Notes (Signed)
C/o urethral catheter leaking, chronic catheter use-last changed 7/15. Denies pain.

## 2018-09-07 NOTE — ED Notes (Signed)
E signature pad not working. Pt educated on discharge instructions and verbalized understanding. Pt discharged and transported home via [facility].

## 2018-09-07 NOTE — ED Provider Notes (Signed)
[facility] Emergency Department Provider Note    First MD Initiated Contact with Patient 09/07/18 1400     (approximate)  I have reviewed the triage vital signs and the nursing notes.   HISTORY  Chief Complaint Catheter problem    HPI Rebecca [name] is a 58 y.o. female with extensive past medical history with chronic indwelling Foley and chronic UTIs presents the ER for urinary catheter that is clogged and leaking.  States that she frequently has to have the catheter exchanged every 2weeks.  States that she has been prescribed multiple courses of antibiotics.  She denies any fevers.  No nausea or vomiting.  States that she can feel the pressure building up when the catheter is obstructed.  Denies any back pain.  Otherwise feels well.  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    Past Medical History:  Diagnosis Date  . Aortic valve disease    Mild AS / AI - most recent Echo demonstrated tricuspid aortic valve.  . Bacterial endocarditis    History of .  Marland Kitchen Bilateral lower extremity edema    Noncardiac.  Chronic. LE Venous dopplers - negative for DVT.; Echocardiogram January 2016: Normal EF with normal wall motion and valve function. Only grade 1 diastolic dysfunction. EF 60-65%. Mild MR  . Breast cancer ([place]) 12-31-13   Right breast, 12:00, 1.5 cm, T1c,N0 invasive mammary carcinoma, triple negative. --> Rx with Chemo  . Cervical stenosis of spine   . Herpes zoster   . IBS (irritable bowel syndrome)   . Lymphedema    has legs [facility] at [facility]  . Multiple sclerosis ([place]) 2001   Walks from room to room @ home; but Wheelchair when going out.  Marland Kitchen Neuromuscular disorder ([place])    MS  . Seizures ([place])    Takes Keppra  . Syncope and collapse    Family History  Problem Relation Age of Onset  . Cancer Father        skin  . Heart disease  Father   . Heart attack Father        heart attack in his 25's  . Thyroid disease Sister   . Ovarian cancer Cousin   . Breast cancer Maternal Aunt 60  . Breast cancer Maternal Grandmother 71  . Bladder Cancer Neg Hx   . Kidney cancer Neg Hx    Past Surgical History:  Procedure Laterality Date  . ANKLE SURGERY     Left  . ANKLE SURGERY    . ANTERIOR CERVICAL DECOMP/DISCECTOMY FUSION  11/17/2011   Procedure: ANTERIOR CERVICAL DECOMPRESSION/DISCECTOMY FUSION 2 LEVELS;  Surgeon: Erline Levine, MD;  Location: [place] [facility];  Service: Neurosurgery;  Laterality: N/A;  Cervical Five-Six Six-Seven Anterior cervical decompression/diskectomy/fusion  . BREAST BIOPSY Right 12-31-13   invasive mammary  . BREAST SURGERY Right 02/03/2014   Right simple mastectomy with sentinel node biopsy.  . CHOLECYSTECTOMY    . COLONOSCOPY  2014  . Lower extremity venous Dopplers  Feb 27, 2013   No LE DVT  . MASTECTOMY Right 2015  . Port a cath insertion Right 01/19/2010  . PORT-A-CATH REMOVAL     right  . PORT-A-CATH REMOVAL Right 09/03/2013   Procedure: REMOVAL PORT-A-CATH;  Surgeon: Conrad Smethport, MD;  Location: [place];  Service: Vascular;  Laterality: Right;  . TRANSTHORACIC ECHOCARDIOGRAM  03/2013; 02/2014   a) Normal LV  size and function with EF 60-65%.; Cannot exclude bicuspid aortic valve with mild AS and mild AI.; b) Normal EF with normal wall motion and valve function x Mild MR. G2 DD. EF 60-65%. Tricuspid AoV  . UPPER GI ENDOSCOPY  2014   Patient Active Problem List   Diagnosis Date Noted  . Ambulatory dysfunction 02/02/2018  . Abnormality of gait 11/17/2017  . 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 Bilateral lower extremity pain (primary) (bilateral) (right greater than left) 08/01/2016  . Chronic neck pain (secondary) (bilateral) ( left greater than right) 07/28/2016  . Long term current use of opiate analgesic 07/28/2016  . Long term prescription opiate use  07/28/2016  . Opiate use 07/28/2016  . Neutropenia ([place]) 06/27/2016  . Chronic pain syndrome 06/03/2016  . Adjustment disorder with mixed anxiety and depressed mood   . Neurogenic bladder   . Neurogenic bowel   . Detrusor and sphincter dyssynergia   . Urinary incontinence   . Seizure disorder ([place])   . Slow transit constipation   . 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 Lymphedema   . Hypoalbuminemia due to protein-calorie malnutrition ([place])   . Encounter for screening for cervical cancer  11/27/2015  . Preventative health care 11/25/2015  . History of breast cancer in female 10/28/2015  . Anemia 10/28/2015  . Thrombocytopenia ([place]) 10/28/2015  . Allergic rhinitis 10/28/2015  . IBS (irritable bowel syndrome) 10/28/2015  . 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 History of right mastectomy 10/24/2014  . Medicare annual wellness visit, subsequent 10/24/2014  . MS (multiple sclerosis) ([place]) 09/16/2014  . Hematoma complicating a procedure 03/06/2014  . Primary cancer of right female breast ([place]) 01/10/2014  . SOB (shortness of breath) 03/01/2013  . Bilateral lower extremity edema   . 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 Authorizing Provider  amantadine (SYMMETREL) 100 MG capsule Take 1 capsule (100 mg total) by mouth 3 (three) times daily. 06/05/18   Penumalli, Earlean Polka, MD  baclofen (LIORESAL) 10 MG tablet TAKE 1 TO 2 TABLETS BY MOUTH 3 TIMES A DAY 03/20/18   Penumalli, Earlean Polka, MD  cefdinir (OMNICEF) 300 MG capsule Take 1 capsule (300 mg total) by mouth 2 (two) times daily. 08/22/18   Carrie Mew, MD  Disposable Gloves (ASSURANCE VINYL EXAM GLOVES) MISC Neurogenic bladder; MS; LON 99 months; for use for personal hygiene 12/26/17   Lada, Satira Anis, MD  fluticasone (FLONASE) 50 MCG/ACT nasal spray SPRAY 2 SPRAYS INTO EACH NOSTRIL EVERY DAY 07/13/18   [provider]  glucosamine-chondroitin  500-400 MG tablet Take 1 tablet by mouth 4 (four) times daily.    [provider]  ibuprofen (ADVIL,MOTRIN) 600 MG tablet Take 1 tablet (600 mg total) by mouth every 8 (eight) hours as needed. 04/18/18   [name], MD  Interferon Beta-1a (REBIF REBIDOSE) 44 MCG/0.5ML SOAJ Inject 0.5 mLs into the skin every Monday, Wednesday, and Friday. 03/21/18   Penumalli, Earlean Polka, MD  levETIRAcetam (KEPPRA) 500 MG tablet Take 1 tablet (500 mg total) by mouth 2 (two) times daily. 06/05/18   Penumalli, Earlean Polka, MD  loratadine (CLARITIN) 10 MG tablet Take 10 mg by mouth daily.    [provider]  Misc Natural Products (LEG VEIN & CIRCULATION) TABS Take 1 tablet by mouth as directed.    [provider]  Multiple Vitamins-Minerals (HAIR SKIN AND NAILS FORMULA) TABS  Take 1 tablet by mouth daily.     [provider]  Multiple Vitamins-Minerals (MULTIVITAMIN PO) Take 1 tablet by mouth daily.     [provider]  Multiple Vitamins-Minerals (PRESERVISION AREDS 2) CAPS Take 1 capsule by mouth 2 (two) times daily.    [provider]  oxybutynin (DITROPAN-XL) 10 MG 24 hr tablet TAKE 2 TABLETS BY MOUTH EVERY DAY 04/20/18   Penumalli, Vikram R, MD  tiZANidine (ZANAFLEX) 4 MG tablet TAKE 1/2 TABLETS (2 MG TOTAL) BY MOUTH 2 (TWO) TIMES DAILY AS NEEDED (SPASTICITY UNCONTROLLED). 08/01/18   Jamse Arn, MD  Turmeric 500 MG CAPS Take 500 mg by mouth daily.    [provider]    Allergies Fentanyl and Sulfa antibiotics    Social History Social History   Tobacco Use  . Smoking status: Never Smoker  . Smokeless tobacco: Never Used  Substance Use Topics  . Alcohol use: No  . Drug use: No    Review of Systems Patient denies headaches, rhinorrhea, blurry vision, numbness, shortness of breath, chest pain, edema, cough, abdominal pain, nausea, vomiting, diarrhea, dysuria, fevers, rashes or hallucinations unless otherwise stated above in HPI.  ____________________________________________   PHYSICAL EXAM:  VITAL SIGNS: Vitals:   09/07/18 1245  BP: 128/86  Pulse: 78  Resp: 16  Temp: 98 F (36.7 C)  SpO2: 100%    Constitutional: Alert and oriented.  Eyes: Conjunctivae are normal.  Head: Atraumatic. Nose: No congestion/rhinnorhea. Mouth/Throat: Mucous membranes are moist.   Neck: No stridor. Painless ROM.  Cardiovascular: Normal rate, regular rhythm. Grossly normal heart sounds.  Good peripheral circulation. Respiratory: Normal respiratory effort.  No retractions. Lungs CTAB. Gastrointestinal: Soft and nontender. No distention. No abdominal bruits. No CVA tenderness. Genitourinary:  Musculoskeletal: No lower extremity tenderness nor edema.  No joint effusions. Neurologic:  Normal speech and language. No gross focal neurologic deficits are appreciated. No facial droop Skin:  Skin is warm, dry and intact. No rash noted. Psychiatric: Mood and affect are normal. Speech and behavior are normal.  ____________________________________________   LABS (all labs ordered are listed, but only abnormal results are displayed)  No results found for this or any previous visit (from the past 24 hour(s)). ____________________________________________ ____________________________________________  [id number]   ____________________________________________   PROCEDURES  Procedure(s) performed:  Procedures    Critical Care performed: no ____________________________________________   INITIAL IMPRESSION / ASSESSMENT AND PLAN / ED COURSE  Pertinent labs & imaging results that were available during my care of the patient were reviewed by me and considered in my medical decision making (see chart for details).   DDX: Obstructed Foley catheter, displaced catheter, chronic UTI, chronic colonization  Rebecca [name] is a 58 y.o. who presents to the ED with symptoms as described above.  Patient well-appearing in no acute distress.   Seems to frequently develop sediment in her urine and recurrent obstructed Foley's.  Foley catheter exchanged.  Patient feels better.  Do not feel the patient requires admission.  She has follow-up with ID.  We discussed signs and symptoms for which she should return to the ER.  Will send off repeat urine culture.  Do not feel that additional antibiotics clinically indicated at this time given her multiple courses previously and development of multidrug-resistant organisms as she currently has no other symptoms to suggest infection other than colonization.  Have discussed with the patient and available family all diagnostics and treatments performed thus far and all questions were answered to the best of my  ability. The patient demonstrates understanding and agreement with plan.      The patient was evaluated in Emergency Department today for the symptoms described in the history of present illness. He/she was evaluated in the context of the global COVID-19 pandemic, which necessitated consideration that the patient might be at risk for infection with the SARS-CoV-2 virus that causes COVID-19. Institutional protocols and algorithms that pertain to the evaluation of patients at risk for COVID-19 are in a state of rapid change based on information released by regulatory bodies including the CDC and federal and state organizations. These policies and algorithms were followed during the patient's care in the ED.  As part of my medical decision making, I reviewed the following data within the [place] notes reviewed and incorporated, Labs reviewed, notes from prior ED visits and [place] Controlled Substance Database   ____________________________________________   FINAL CLINICAL IMPRESSION(S) / ED DIAGNOSES  Final diagnoses:  Foley catheter problem, initial encounter [facility])      NEW MEDICATIONS STARTED DURING THIS VISIT:  New Prescriptions   No medications on file      Note:  This document was prepared using Dragon voice recognition software and may include unintentional dictation errors.    Merlyn Lot, MD 09/07/18 [phone]

## 2018-09-07 NOTE — ED Notes (Signed)
First Nurse Note: Pt to ED via [facility] to have urinary cath replaced. Last changed on 08/22/18.

## 2018-09-09 LAB — URINE CULTURE: Culture: >=100000 — AB

## 2018-09-10 ENCOUNTER — Encounter: Payer: Medicare Other | Admitting: Obstetrics & Gynecology

## 2018-09-10 NOTE — Progress Notes (Signed)
ED Antimicrobial Stewardship Positive Culture Follow Up   Rebecca [name] is an 58 y.o. female who presented to [facility] on 09/07/2018 with a chief complaint of  Chief Complaint  Patient presents with  . Catheter problem    Recent Results (from the past 720 hour(s))  Urine culture     Status: Abnormal   Collection Time: 08/22/18  6:30 PM   Specimen: Urine, Random  Result Value Ref Range Status   Specimen Description   Final    URINE, RANDOM Performed at [facility], [address], [place], [place] [address]    Special Requests   Final    NONE Performed at [facility], [address] [place], [place] [address]    Culture (A)  Final    >=100,000 COLONIES/mL MORGANELLA MORGANII 80,000 COLONIES/mL VANCOMYCIN RESISTANT ENTEROCOCCUS    Report Status 08/24/2018 FINAL  Final   Organism ID, Bacteria MORGANELLA MORGANII (A)  Final   Organism ID, Bacteria VANCOMYCIN RESISTANT ENTEROCOCCUS (A)  Final      Susceptibility   Morganella morganii - MIC*    AMPICILLIN >=32 RESISTANT Resistant     CEFAZOLIN >=64 RESISTANT Resistant     CEFTRIAXONE <=1 SENSITIVE Sensitive     CIPROFLOXACIN 1 SENSITIVE Sensitive     GENTAMICIN <=1 SENSITIVE Sensitive     IMIPENEM 4 SENSITIVE Sensitive     NITROFURANTOIN RESISTANT Resistant     TRIMETH/SULFA <=20 SENSITIVE Sensitive     AMPICILLIN/SULBACTAM >=32 RESISTANT Resistant     PIP/TAZO <=4 SENSITIVE Sensitive     * >=100,000 COLONIES/mL MORGANELLA MORGANII   Vancomycin resistant enterococcus - MIC*    AMPICILLIN >=32 RESISTANT Resistant     LEVOFLOXACIN 4 INTERMEDIATE Intermediate     NITROFURANTOIN 128 RESISTANT Resistant     VANCOMYCIN >=32 RESISTANT Resistant     GENTAMICIN SYNERGY SENSITIVE Sensitive     * 80,000 COLONIES/mL VANCOMYCIN RESISTANT ENTEROCOCCUS  Urine Culture     Status: Abnormal   Collection Time: 09/07/18  2:48 PM   Specimen: Urine, Random  Result Value Ref Range Status   Specimen Description   Final     URINE, RANDOM Performed at [facility], [address] [place], [place] [address]    Special Requests   Final    NONE Performed at [facility], [address] [place], [place] [address]    Culture >=100,000 COLONIES/mL [facility] MORGANII (A)  Final   Report Status 09/09/2018 FINAL  Final   Organism ID, Bacteria MORGANELLA MORGANII (A)  Final      Susceptibility   Morganella morganii - MIC*    AMPICILLIN >=32 RESISTANT Resistant     CEFAZOLIN >=64 RESISTANT Resistant     CEFTRIAXONE <=1 SENSITIVE Sensitive     CIPROFLOXACIN >=4 RESISTANT Resistant     GENTAMICIN <=1 SENSITIVE Sensitive     IMIPENEM 1 SENSITIVE Sensitive     NITROFURANTOIN 128 RESISTANT Resistant     TRIMETH/SULFA <=20 SENSITIVE Sensitive     AMPICILLIN/SULBACTAM >=32 RESISTANT Resistant     PIP/TAZO <=4 SENSITIVE Sensitive     * >=100,000 COLONIES/mL MORGANELLA MORGANII     []  Patient discharged originally without antimicrobial agent.  Messaged via secure chat, Zara Council PA and Dr Tsosie Billing concerning Urine culture of Morganella morganii from 09/07/2018. Patient came to ER for clogged foley catheter (has chronic foley with hx of UTIs) and had no fever or s/sx of UTI. 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 May be colonization of Morganella? Per both providers above if patient is asymptomatic then no need to treat as she is likely colonized. Has f/u urology appt for 09/21/2018   Caige Almeda A 09/10/2018, 3:45 PM Clinical Pharmacist

## 2018-09-11 ENCOUNTER — Encounter: Payer: Self-pay | Admitting: Infectious Diseases

## 2018-09-11 ENCOUNTER — Ambulatory Visit: Payer: Medicare Other | Attending: Infectious Diseases | Admitting: Infectious Diseases

## 2018-09-11 ENCOUNTER — Other Ambulatory Visit: Payer: Self-pay

## 2018-09-11 VITALS — BP 164/97 | HR 79 | Temp 97.5°F

## 2018-09-11 DIAGNOSIS — I890 Lymphedema, not elsewhere classified: Secondary | ICD-10-CM

## 2018-09-11 DIAGNOSIS — Z885 Allergy status to narcotic agent status: Secondary | ICD-10-CM | POA: Diagnosis not present

## 2018-09-11 DIAGNOSIS — R8271 Bacteriuria: Secondary | ICD-10-CM

## 2018-09-11 DIAGNOSIS — Z960 Presence of urogenital implants: Secondary | ICD-10-CM | POA: Diagnosis not present

## 2018-09-11 DIAGNOSIS — Z881 Allergy status to other antibiotic agents status: Secondary | ICD-10-CM | POA: Diagnosis not present

## 2018-09-11 DIAGNOSIS — Z853 Personal history of malignant neoplasm of breast: Secondary | ICD-10-CM | POA: Diagnosis not present

## 2018-09-11 DIAGNOSIS — Z9011 Acquired absence of right breast and nipple: Secondary | ICD-10-CM | POA: Diagnosis not present

## 2018-09-11 DIAGNOSIS — G35 Multiple sclerosis: Secondary | ICD-10-CM | POA: Diagnosis not present

## 2018-09-11 DIAGNOSIS — N319 Neuromuscular dysfunction of bladder, unspecified: Secondary | ICD-10-CM

## 2018-09-11 DIAGNOSIS — Z9221 Personal history of antineoplastic chemotherapy: Secondary | ICD-10-CM | POA: Diagnosis not present

## 2018-09-11 NOTE — Patient Instructions (Addendum)
You are here to see me for urinary infection- you have MS and you have a foley catheter for neurogenic bladder.the catheter is  colonized with stool bacteria. This is necessarily not an infection so you dont need antibiotic-  Suprapubic catheter is a better option and before the procedure the urine can be checked and if needed antibiotic can be given for 2-3 days. Color and smell of the urine is not an indication there is infection  * Get a leg bag for urine collection  *Estrace /Premarin cream topically- peasized apply topically three times a week * Cetaphil to clean the genital area ( not soap)  * Cranberry supplement (-Knudsen cranberry concentrate- 1 ounce mixed with 8 ounces of water *wash with water after bowel movt *Probiotic for vaginal health( can try [employer]ls vaginal health) * Increase water consumption- 8-10  glasses a day *Ask your doctors not to check your urine on a routine basis  *Avoid antibiotics unless systemic infection/ or before cystoscopy * Kegel Exercise to strengthen pelvic floor

## 2018-09-11 NOTE — Progress Notes (Signed)
NAME: Rebecca [name]  DOB: [birth year]  MRN: [id number]  Date/Time: 09/11/2018 12:22 PM  REQUESTING PROVIDER: [facility] Subjective:  REASON FOR CONSULT: UTI versus colonization ? Rebecca [name] is a 58 y.o. female with a history of multiple sclerosis, neurogenic bladder on Foley catheter since December 2019, bilateral lower extremity lymphedema, right breast carcinoma status post mastectomy and chemotherapy, is referred to me because of bacteria in the urine. Patient says she has had a Foley catheter since February 06, 2018 and the Foley gets clogged up with debris and had to be changed every 2 weeks.  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 There is a she is going to get a suprapubic catheter placed. She does not have any fever or chills or fatigue. No dysuria or flank pain.  She is got limited mobility and is pretty much in chair.  Her husband is a Land. She also gets visiting nurses 3 times a week to take care of her personal hygiene. Past medical history Bilateral lower extremity edema Neurogenic bladder Multiple sclerosis Breast cancer right Seizure She denies infective endocarditis Past Surgical History:  Procedure Laterality Date  . ANKLE SURGERY     Left  . ANKLE SURGERY    . ANTERIOR CERVICAL DECOMP/DISCECTOMY FUSION  11/17/2011   Procedure: ANTERIOR CERVICAL DECOMPRESSION/DISCECTOMY FUSION 2 LEVELS;  Surgeon: Erline Levine, MD;  Location: [facility];  Service: Neurosurgery;  Laterality: N/A;  Cervical Five-Six Six-Seven Anterior cervical decompression/diskectomy/fusion  . BREAST BIOPSY Right 12-31-13   invasive mammary  . BREAST SURGERY Right 02/03/2014   Right simple mastectomy with sentinel node biopsy.  . CHOLECYSTECTOMY    . COLONOSCOPY  2014  . Lower extremity venous Dopplers  Feb 27, 2013   No LE DVT  . MASTECTOMY Right 2015  . Port a cath insertion Right 01/19/2010  .  PORT-A-CATH REMOVAL     right  . PORT-A-CATH REMOVAL Right 09/03/2013   Procedure: REMOVAL PORT-A-CATH;  Surgeon: Conrad , MD;  Location: [facility];  Service: Vascular;  Laterality: Right;  . TRANSTHORACIC ECHOCARDIOGRAM  03/2013; 02/2014   a) Normal LV size and function with EF 60-65%.; Cannot exclude bicuspid aortic valve with mild AS and mild AI.; b) Normal EF with normal wall motion and valve function x Mild MR. G2 DD. EF 60-65%. Tricuspid AoV  . UPPER GI ENDOSCOPY  2014    Social History   Socioeconomic History  . Marital status: Married    Spouse name: Rebecca [name]  . Number of children: 0  . Years of education: 50  . Highest education level: Not on file  Occupational History  . Occupation: disability  Social Needs  . Financial resource strain: Not on file  . Food insecurity    Worry: Not on file    Inability: Not on file  . Transportation needs    Medical: Not on file    Non-medical: Not on file  Tobacco Use  . Smoking status: Never Smoker  . Smokeless tobacco: Never Used  Substance and Sexual Activity  . Alcohol use: No  . Drug use: No  . Sexual activity: Not Currently    Partners: Male  Lifestyle  . Physical activity    Days per week: Not on file    Minutes per session: Not on file  . Stress: Not on file  Relationships  . Social connections    Talks on phone: Not on file  Gets together: Not on file    Attends religious service: Not on file    Active member of club or organization: Not on file    Attends meetings of clubs or organizations: Not on file    Relationship status: Not on file  . Intimate partner violence    Fear of current or ex partner: No    Emotionally abused: No    Physically abused: No    Forced sexual activity: No  Other Topics Concern  . Not on file  Social History Narrative   She is married. Recently moved back to [place] after being in [place] for some time. She is accompanied by her husband and aunt.   Never smoked. Never used  alcohol.    Family History  Problem Relation Age of Onset  . Cancer Father        skin  . Heart disease Father   . Heart attack Father        heart attack in his 101's  . Thyroid disease Sister   . Ovarian cancer Cousin   . Breast cancer Maternal Aunt 60  . Breast cancer Maternal Grandmother 51  . Bladder Cancer Neg Hx   . Kidney cancer Neg Hx    Allergies  Allergen Reactions  . Fentanyl Nausea And Vomiting and Nausea Only    vomiting Was given in PACU x3 each time patient got sick vomiting Was given in PACU x3 each time patient got sick  . Sulfa Antibiotics Hives and Other (See Comments)    Light headed, over heated    ? Current Outpatient Medications  Medication Sig Dispense Refill  . Disposable Gloves (ASSURANCE VINYL EXAM GLOVES) MISC Neurogenic bladder; MS; LON 99 months; for use for personal hygiene 50 each 11  . fluticasone (FLONASE) 50 MCG/ACT nasal spray SPRAY 2 SPRAYS INTO EACH NOSTRIL EVERY DAY    . glucosamine-chondroitin 500-400 MG tablet Take 1 tablet by mouth 4 (four) times daily.    Marland Kitchen ibuprofen (ADVIL,MOTRIN) 600 MG tablet Take 1 tablet (600 mg total) by mouth every 8 (eight) hours as needed. 180 tablet 1  . Interferon Beta-1a (REBIF REBIDOSE) 44 MCG/0.5ML SOAJ Inject 0.5 mLs into the skin every Monday, Wednesday, and Friday. 12 Syringe 11  . levETIRAcetam (KEPPRA) 500 MG tablet Take 1 tablet (500 mg total) by mouth 2 (two) times daily. 180 tablet 4  . loratadine (CLARITIN) 10 MG tablet Take 10 mg by mouth daily.    . Misc Natural Products (LEG VEIN & CIRCULATION) TABS Take 1 tablet by mouth as directed.    . Multiple Vitamins-Minerals (HAIR SKIN AND NAILS FORMULA) TABS Take 1 tablet by mouth daily.     . Multiple Vitamins-Minerals (MULTIVITAMIN PO) Take 1 tablet by mouth daily.     . Multiple Vitamins-Minerals (PRESERVISION AREDS 2) CAPS Take 1 capsule by mouth 2 (two) times daily.    Marland Kitchen oxybutynin (DITROPAN-XL) 10 MG 24 hr tablet TAKE 2 TABLETS BY MOUTH EVERY  DAY 180 tablet 3  . tiZANidine (ZANAFLEX) 4 MG tablet TAKE 1/2 TABLETS (2 MG TOTAL) BY MOUTH 2 (TWO) TIMES DAILY AS NEEDED (SPASTICITY UNCONTROLLED). 90 tablet 1  . Turmeric 500 MG CAPS Take 500 mg by mouth daily.    Marland Kitchen amantadine (SYMMETREL) 100 MG capsule Take 1 capsule (100 mg total) by mouth 3 (three) times daily. 270 capsule 4  . baclofen (LIORESAL) 10 MG tablet TAKE 1 TO 2 TABLETS BY MOUTH 3 TIMES A DAY 540 tablet 2  .  cefdinir (OMNICEF) 300 MG capsule Take 1 capsule (300 mg total) by mouth 2 (two) times daily. (Patient not taking: Reported on 09/11/2018) 14 capsule 0   No current facility-administered medications for this visit.      Abtx:  Anti-infectives (From admission, onward)   None      REVIEW OF SYSTEMS:  Const: negative fever, negative chills, negative weight loss Eyes: negative diplopia or visual changes, negative eye pain ENT: negative coryza, negative sore throat Resp: negative cough, hemoptysis, dyspnea Cards: negative for chest pain, palpitations, positive for lower extremity edema GU: Indwelling Foley catheter   GI: Bowel movement every other day.  Heme: negative for easy bruising and gum/nose bleeding MS: negative for myalgias, arthralgias, back pain and muscle weakness Neurolo: Weakness in her legs. Psych: negative for feelings of anxiety, depression  Allergy/Immunology-as listed above  Objective:  VITALS:  BP (!) 164/97 (BP Location: Right Arm, Patient Position: Sitting, Cuff Size: Normal)   Pulse 79   Temp (!) 97.5 F (36.4 C) (Oral)  PHYSICAL EXAM: In wheelchair General: Alert, cooperative, no distress, appears stated age.  Pale Head: Normocephalic, without obvious abnormality, atraumatic. Eyes: Conjunctivae clear, anicteric sclerae. Pupils are equal ENT did not examine because of mask neck: Supple, symmetrical, no adenopathy, thyroid: non tender no carotid bruit and no JVD. Back: No CVA tenderness. Lungs: Clear to auscultation bilaterally. No  Wheezing or Rhonchi. No rales. Heart: Regular rate and rhythm, no murmur, rub or gallop. Abdomen: Did not examine Extremities: Bilateral edema legs Skin: No rashes or lesions. Or bruising Lymph: Cervical, supraclavicular normal. Neurologic: Did not examine Pertinent Labs Lab Results CBC    Component Value Date/Time   WBC 4.3 04/10/2018 1349   RBC 4.07 04/10/2018 1349   HGB 12.3 04/10/2018 1349   HGB 12.4 06/27/2016 1515   HCT 36.5 04/10/2018 1349   HCT 37.9 06/27/2016 1515   PLT 112 (L) 04/10/2018 1349   PLT 163 06/27/2016 1515   MCV 89.7 04/10/2018 1349   MCV 92 06/27/2016 1515   MCV 93 06/05/2014 0951   MCH 30.2 04/10/2018 1349   MCHC 33.7 04/10/2018 1349   RDW 12.2 04/10/2018 1349   RDW 13.2 06/27/2016 1515   RDW 18.7 (H) 06/05/2014 0951   LYMPHSABS 1.1 04/10/2018 1349   LYMPHSABS 1.0 06/27/2016 1515   LYMPHSABS 0.3 (L) 06/05/2014 0951   MONOABS 0.4 04/10/2018 1349   MONOABS 0.3 06/05/2014 0951   EOSABS 0.1 04/10/2018 1349   EOSABS 0.1 06/27/2016 1515   EOSABS 0.1 06/05/2014 0951   BASOSABS 0.0 04/10/2018 1349   BASOSABS 0.0 06/27/2016 1515   BASOSABS 0.0 06/05/2014 0951    CMP Latest Ref Rng & Units 04/10/2018 02/04/2018 02/03/2018  Glucose 70 - 99 mg/dL 98 129(H) 134(H)  BUN 6 - 20 mg/dL 20 17 18   Creatinine 0.44 - 1.00 mg/dL 0.50 0.54 0.54  Sodium 135 - 145 mmol/L 138 137 137  Potassium 3.5 - 5.1 mmol/L 3.6 3.2(L) 3.5  Chloride 98 - 111 mmol/L 106 98 102  CO2 22 - 32 mmol/L 24 29 28   Calcium 8.9 - 10.3 mg/dL 9.0 9.5 8.5(L)  Total Protein 6.5 - 8.1 g/dL 7.7 - -  Total Bilirubin 0.3 - 1.2 mg/dL 0.7 - -  Alkaline Phos 38 - 126 U/L 88 - -  AST 15 - 41 U/L 24 - -  ALT 0 - 44 U/L 23 - -      Microbiology: Recent Results (from the past 240 hour(s))  Urine Culture  Status: Abnormal   Collection Time: 09/07/18  2:48 PM   Specimen: Urine, Random  Result Value Ref Range Status   Specimen Description   Final    URINE, RANDOM Performed at [facility], [address] [place], [place] [address]    Special Requests   Final    NONE Performed at [facility], [place], [place] [address]    Culture >=100,000 COLONIES/mL [facility] MORGANII (A)  Final   Report Status 09/09/2018 FINAL  Final   Organism ID, Bacteria MORGANELLA MORGANII (A)  Final      Susceptibility   Morganella morganii - MIC*    AMPICILLIN >=32 RESISTANT Resistant     CEFAZOLIN >=64 RESISTANT Resistant     CEFTRIAXONE <=1 SENSITIVE Sensitive     CIPROFLOXACIN >=4 RESISTANT Resistant     GENTAMICIN <=1 SENSITIVE Sensitive     IMIPENEM 1 SENSITIVE Sensitive     NITROFURANTOIN 128 RESISTANT Resistant     TRIMETH/SULFA <=20 SENSITIVE Sensitive     AMPICILLIN/SULBACTAM >=32 RESISTANT Resistant     PIP/TAZO <=4 SENSITIVE Sensitive     * >=100,000 COLONIES/mL MORGANELLA MORGANII    ? Impression/Recommendation ?58 year old female with multiple sclerosis and neurogenic bladder has an indwelling Foley catheter..    Colonization with Morganella in the urine.  She also had VRE in the urine on 08/22/2018.  But the last culture from 09/07/2018 does not show the VRE.  She has no active infection she does not need treatment currently.  Explained to the patient the color of the urine or smell of the urine is not indicative of infection and it just merely indicates that she needs to increase her urine water intake. Also explained to her colonization versus asymptomatic bacteriuria and true infection and that she should not be testing her urine on a routine basis and taking antibiotics for simple asymptomatic bacteriuria.    Before she goes for suprapubic catheter placement a urine culture can be sent and a course of antibiotic orally can be given if the culture is positive.  She would benefit from a leg bag especially when she is traveling.  Explained to her and her husband to follow the following to avoid colonization  *Estrace /Premarin cream topically-  peasized apply topically three times a week * Cetaphil to clean the genital area ( not soap)  * Cranberry supplement (-Knudsen cranberry concentrate- 1 ounce mixed with 8 ounces of water *wash with water after bowel movt *Probiotic for vaginal health( can try Pearls vaginal health) * Increase water consumption- 8 -10 glasses a day *Ask your doctors not to check your urine on a routine basis  *Avoid antibiotics unless systemic infection/ or before cystoscopy * Kegel Exercise to strengthen pelvic floor ?  Multiple sclerosis on beta interferon.  No follow-up needed on a routine basis ___________________________________________________ Discussed with patient, her husband in great detail Note:  This document was prepared using Dragon voice recognition software and may include unintentional dictation errors.

## 2018-09-13 ENCOUNTER — Encounter: Payer: Medicare Other | Attending: Physical Medicine & Rehabilitation | Admitting: Physical Medicine & Rehabilitation

## 2018-09-21 ENCOUNTER — Ambulatory Visit: Payer: Medicare Other | Admitting: Physician Assistant

## 2018-09-21 ENCOUNTER — Ambulatory Visit: Payer: Medicare Other

## 2018-09-24 ENCOUNTER — Other Ambulatory Visit: Payer: Self-pay

## 2018-09-24 ENCOUNTER — Encounter: Payer: Self-pay | Admitting: Physician Assistant

## 2018-09-24 ENCOUNTER — Ambulatory Visit (INDEPENDENT_AMBULATORY_CARE_PROVIDER_SITE_OTHER): Payer: Medicare Other | Admitting: Physician Assistant

## 2018-09-24 VITALS — BP 140/85 | HR 78 | Ht 64.0 in

## 2018-09-24 DIAGNOSIS — Z466 Encounter for fitting and adjustment of urinary device: Secondary | ICD-10-CM

## 2018-09-24 DIAGNOSIS — R399 Unspecified symptoms and signs involving the genitourinary system: Secondary | ICD-10-CM | POA: Diagnosis not present

## 2018-09-24 DIAGNOSIS — Z978 Presence of other specified devices: Secondary | ICD-10-CM | POA: Diagnosis not present

## 2018-09-24 DIAGNOSIS — S31829A Unspecified open wound of left buttock, initial encounter: Secondary | ICD-10-CM

## 2018-09-24 MED ORDER — RENACIDIN IR SOLN: 60.0000 mL | Freq: Two times a day (BID) | 0 refills | Status: AC

## 2018-09-24 NOTE — Progress Notes (Signed)
09/24/2018 4:22 PM   Rebecca [name] Jan 01, 1961 [id number]  CC: Urinary catheter difficulties, open gluteal wound  HPI: Rebecca [name] is a 58 y.o. female who presents today for indwelling Foley exchange and to discuss problems with chronic catheterization. She is an established [facility] patient of Shannon McGowan's.  She has a PMH MS with neurogenic bladder and urinary retention and has had an indwelling Foley catheter since December 2019. She has repeatedly reported problems with her catheter including foul-smelling urine and sediment clogging her tube. As of her last visit with Zara Council on 08/06/2018, she was switched to in-office monthly catheter exchanges with concerns that home health may have been placing her catheters improperly due to her complex pelvic anatomy.  She was subsequently seen by infectious diseases on 09/11/2018 for her asymptomatic bacteriuria.  Note from that visit indicates that patient and her husband were counseled on the difference between asymptomatic bacteriuria and true infection.  She was provided with methods to avoid colonization including cranberry supplementation and daily probiotics.  They state they have been compliant with these suggestions.  She is accompanied today by her husband, who is her caretaker.  Today, patient and her husband report continued foul-smelling urine and urinary sediment.  They state that her catheter is getting clogged approximately every 2 weeks despite daily irrigation with a vinegar solution, as previously recommended.  They have been seeking care in the emergency department for catheter exchange when this occurs.  Her last catheter exchanges were on 09/07/2018 and 08/22/2018, both in the emergency department.   They have been counseled on the option of proceeding to a suprapubic tube, with notes from Zara Council stating that this was not expected to resolve the odor and sediment issues that they are reporting.  She denies nausea,  vomiting, abdominal pain, fevers, chills, and gross hematuria.  She reports that the symptoms bother her most about her catheter are urinary odor and frequent clogging of her tube.  She does report some leakage, however this appears to be secondary to tube obstruction.  Additionally, patient and her husband report that the home aide who assists her with bathing 3 times weekly noted a new left gluteal wound approximately 1 week ago that is not healing.  PMH: Past Medical History:  Diagnosis Date  . Aortic valve disease    Mild AS / AI - most recent Echo demonstrated tricuspid aortic valve.  . Bacterial endocarditis    History of .  Marland Kitchen Bilateral lower extremity edema    Noncardiac.  Chronic. LE Venous dopplers - negative for DVT.; Echocardiogram January 2016: Normal EF with normal wall motion and valve function. Only grade 1 diastolic dysfunction. EF 60-65%. Mild MR  . Breast cancer ([place]) 12-31-13   Right breast, 12:00, 1.5 cm, T1c,N0 invasive mammary carcinoma, triple negative. --> Rx with Chemo  . Cervical stenosis of spine   . Herpes zoster   . IBS (irritable bowel syndrome)   . Lymphedema    has legs wrapped at [facility]  . Multiple sclerosis ([place]) 2001   Walks from room to room @ home; but Wheelchair when going out.  Marland Kitchen Neuromuscular disorder ([place])    MS  . Seizures ([place])    Takes Keppra  . Syncope and collapse     Surgical History: Past Surgical History:  Procedure Laterality Date  . ANKLE SURGERY     Left  . ANKLE SURGERY    . ANTERIOR CERVICAL DECOMP/DISCECTOMY FUSION  11/17/2011   Procedure: ANTERIOR CERVICAL  DECOMPRESSION/DISCECTOMY FUSION 2 LEVELS;  Surgeon: Erline Levine, MD;  Location: [facility];  Service: Neurosurgery;  Laterality: N/A;  Cervical Five-Six Six-Seven Anterior cervical decompression/diskectomy/fusion  . BREAST BIOPSY Right 12-31-13   invasive mammary  . BREAST SURGERY Right 02/03/2014   Right simple mastectomy with sentinel node biopsy.  . CHOLECYSTECTOMY     . COLONOSCOPY  2014  . Lower extremity venous Dopplers  Feb 27, 2013   No LE DVT  . MASTECTOMY Right 2015  . Port a cath insertion Right 01/19/2010  . PORT-A-CATH REMOVAL     right  . PORT-A-CATH REMOVAL Right 09/03/2013   Procedure: REMOVAL PORT-A-CATH;  Surgeon: Conrad Blue [place], MD;  Location: [facility];  Service: Vascular;  Laterality: Right;  . TRANSTHORACIC ECHOCARDIOGRAM  03/2013; 02/2014   a) Normal LV size and function with EF 60-65%.; Cannot exclude bicuspid aortic valve with mild AS and mild AI.; b) Normal EF with normal wall motion and valve function x Mild MR. G2 DD. EF 60-65%. Tricuspid AoV  . UPPER GI ENDOSCOPY  2014    Home Medications:  Allergies as of 09/24/2018      Reactions   Fentanyl Nausea And Vomiting, Nausea Only   vomiting Was given in PACU x3 each time patient got sick vomiting Was given in PACU x3 each time patient got sick   Sulfa Antibiotics Hives, Other (See Comments)   Light headed, over heated      Medication List       Accurate as of September 24, 2018  4:22 PM. If you have any questions, ask your nurse or doctor.        STOP taking these medications   cefdinir 300 MG capsule Commonly known as: OMNICEF Stopped by: Debroah Loop, PA-C     TAKE these medications   amantadine 100 MG capsule Commonly known as: SYMMETREL Take 1 capsule (100 mg total) by mouth 3 (three) times daily.   Assurance Vinyl Exam Gloves Misc Neurogenic bladder; MS; LON 99 months; for use for personal hygiene   baclofen 10 MG tablet Commonly known as: LIORESAL TAKE 1 TO 2 TABLETS BY MOUTH 3 TIMES A DAY   fluticasone 50 MCG/ACT nasal spray Commonly known as: FLONASE SPRAY 2 SPRAYS INTO EACH NOSTRIL EVERY DAY   gluconic acid-citric acid irrigation Irrigate with 60 mLs as directed 2 (two) times daily. Started by: Debroah Loop, PA-C   glucosamine-chondroitin 500-400 MG tablet Take 1 tablet by mouth 4 (four) times daily.   Hair Skin and Nails Formula  Tabs Take 1 tablet by mouth daily.   PreserVision AREDS 2 Caps Take 1 capsule by mouth 2 (two) times daily.   ibuprofen 600 MG tablet Commonly known as: ADVIL Take 1 tablet (600 mg total) by mouth every 8 (eight) hours as needed.   Interferon Beta-1a 44 MCG/0.5ML Soaj Commonly known as: Rebif Rebidose Inject 0.5 mLs into the skin every Monday, Wednesday, and Friday.   Leg Vein & Circulation Tabs Take 1 tablet by mouth as directed.   levETIRAcetam 500 MG tablet Commonly known as: KEPPRA Take 1 tablet (500 mg total) by mouth 2 (two) times daily.   loratadine 10 MG tablet Commonly known as: CLARITIN Take 10 mg by mouth daily.   MULTIVITAMIN PO Take 1 tablet by mouth daily.   oxybutynin 10 MG 24 hr tablet Commonly known as: DITROPAN-XL TAKE 2 TABLETS BY MOUTH EVERY DAY   tiZANidine 4 MG tablet Commonly known as: ZANAFLEX TAKE 1/2 TABLETS (2 MG TOTAL) BY  MOUTH 2 (TWO) TIMES DAILY AS NEEDED (SPASTICITY UNCONTROLLED).   Turmeric 500 MG Caps Take 500 mg by mouth daily.       Allergies:  Allergies  Allergen Reactions  . Fentanyl Nausea And Vomiting and Nausea Only    vomiting Was given in PACU x3 each time patient got sick vomiting Was given in PACU x3 each time patient got sick  . Sulfa Antibiotics Hives and Other (See Comments)    Light headed, over heated    Family History: Family History  Problem Relation Age of Onset  . Cancer Father        skin  . Heart disease Father   . Heart attack Father        heart attack in his 17's  . Thyroid disease Sister   . Ovarian cancer Cousin   . Breast cancer Maternal Aunt 60  . Breast cancer Maternal Grandmother 27  . Bladder Cancer Neg Hx   . Kidney cancer Neg Hx     Social History:   reports that she has never smoked. She has never used smokeless tobacco. She reports that she does not drink alcohol or use drugs.  ROS: UROLOGY Frequent Urination?: Yes Hard to postpone urination?: Yes Burning/pain with  urination?: No Get up at night to urinate?: Yes Leakage of urine?: No Urine stream starts and stops?: No Trouble starting stream?: No Do you have to strain to urinate?: No Blood in urine?: No Urinary tract infection?: Yes Sexually transmitted disease?: No Injury to kidneys or bladder?: No Painful intercourse?: No Weak stream?: No Currently pregnant?: No Vaginal bleeding?: No Last menstrual period?: n  Gastrointestinal Nausea?: No Vomiting?: No Indigestion/heartburn?: No Diarrhea?: No Constipation?: No  Constitutional Fever: No Night sweats?: No Weight loss?: No Fatigue?: Yes  Skin Skin rash/lesions?: No Itching?: No  Eyes Blurred vision?: No Double vision?: No  Ears/Nose/Throat Sore throat?: No Sinus problems?: No  Hematologic/Lymphatic Swollen glands?: No Easy bruising?: No  Cardiovascular Leg swelling?: No Chest pain?: No  Respiratory Cough?: No Shortness of breath?: No  Endocrine Excessive thirst?: No  Musculoskeletal Back pain?: No Joint pain?: Yes  Neurological Headaches?: No Dizziness?: No  Psychologic Depression?: No Anxiety?: No  Physical Exam: BP 140/85 (BP Location: Right Arm, Patient Position: Sitting, Cuff Size: Normal)   Pulse 78   Ht 5\' 4"  (1.626 m)   BMI 37.09 kg/m   Constitutional:  Alert and oriented, no acute distress, nontoxic appearing, in wheelchair HEENT: [place], AT Cardiovascular: No clubbing or cyanosis, BLE edema present Respiratory: Normal respiratory effort, no increased work of breathing GI: Abdomen is soft, nontender, nondistended GU: Foley catheter in place with sediment visible throughout the line and in the overnight collection bag.  Approximate 3 cm left gluteal open wound proximal to the ischial tuberosity with overlying bandage.  Skin breakdown visible at the gluteal crease.  Patient with the damp adult diaper today. Skin: No rashes, bruises or suspicious lesions Neurologic: Requires assistance moving from  wheelchair to examination table today.  Left leg more mobile than right. Psychiatric: Normal mood and affect  Assessment & Plan:   1. Complaint about urinary catheter Patient's greatest complaints with regard to her indwelling catheter are the frequency of required catheter exchanges secondary to urinary sediment obstruction in the production of concentrated, malodorous urine.  I spent a significant amount of time today discussing with the patient and her husband realistic expectations in the setting of an indwelling catheter.    We discussed the differences between indwelling Foley  and suprapubic catheters.  I explained that the placement of an SPT would likely not resolve her greatest complaints, as above.  I also explained that long-term, there is no evidence that an SPT is associated with a lower risk of acute UTI over an indwelling Foley.  She is hesitant to undergo SPT placement if it is not likely to improve her quality of life.  I agree with this.  I reminded her that she may change her mind in the future and we may revisit this at that point.  We also discussed the difference between asymptomatic bacteriuria and acute urinary tract infection.  She has no symptoms concerning for acute infection today.  UA deferred.  At this point, my greatest goal with this patient is for her to be able to retain a urinary catheter for a full month without sediment obstruction.  Patient is currently having her catheter irrigated daily with vinegar solution with no improvement.  I prescribed Renacidin today and counseled her and her husband to irrigate with this twice daily.  I also counseled her to increase her p.o. fluid intake and add Crystal light lemonade solution for citric acid supplementation.  She admits that she does not drink much at baseline.  They expressed an understanding of this plan. -Increase PO fluid intake, with the addition of Crystal Light lemonade if desired -Renacidin 13mL irrigation BID  x30 days  2.  Catheter exchange Patient is present today for a catheter change due to urinary retention and sediment.  56ml of water was removed from the balloon, a 16FR foley cath was removed with out difficulty.  Patient was cleaned and prepped in a sterile fashion with betadine. A 16 FR foley cath was replaced into the bladder no complications were noted Urine return was noted 70ml and urine was yellow in color. The balloon was filled with 94ml of sterile water. A leg bag was attached for drainage.  A night bag was also given to the patient and patient was given instruction on how to change from one bag to another. Patient was given proper instruction on catheter care.    Performed by: Debroah Loop, PA-C Follow up: 1 month for catheter exchange  3.  Open wound of left buttock, initial encounter Patient with limited mobility and urinary catheter leakage presents with approximate one-week history of open left gluteal wound.  Concern for multifactorial etiology, including pressure and moisture.  Will refer to the wound clinic today for further management. -Ambulatory referral to wound clinic  I spent 40 min with this patient, of which greater than 50% was spent in counseling and coordination of care with the patient.   Debroah Loop, PA-C  [facility] [address] [place], [place] [address] [phone]

## 2018-09-26 ENCOUNTER — Ambulatory Visit (INDEPENDENT_AMBULATORY_CARE_PROVIDER_SITE_OTHER): Payer: Medicare Other | Admitting: Obstetrics & Gynecology

## 2018-09-26 ENCOUNTER — Encounter: Payer: Self-pay | Admitting: Obstetrics & Gynecology

## 2018-09-26 ENCOUNTER — Other Ambulatory Visit (HOSPITAL_COMMUNITY)
Admission: RE | Admit: 2018-09-26 | Discharge: 2018-09-26 | Disposition: A | Discharge status: Home / Self Care | Payer: Medicare Other | Source: Ambulatory Visit | Attending: Obstetrics & Gynecology | Admitting: Obstetrics & Gynecology

## 2018-09-26 ENCOUNTER — Other Ambulatory Visit: Payer: Self-pay

## 2018-09-26 VITALS — BP 130/80 | Ht 64.0 in | Wt 214.0 lb

## 2018-09-26 DIAGNOSIS — N950 Postmenopausal bleeding: Secondary | ICD-10-CM | POA: Insufficient documentation

## 2018-09-26 NOTE — Progress Notes (Signed)
Consultant: Zara Council, PA Consulting Reason: PMB  Postmenopausal Bleeding Patient complains of vaginal bleeding. She has been menopausal for 12 years. Currently on no HRT and no new blood thinner medicines. Bleeding is described as scant staining and has occurred a few times first noticed 02/2018 (delay in care due to other med problems and due to Covid 19 concerns). Other menopausal symptoms include: none. Workup to date: none.  She has chronic urinary retention and has foley at this time, sees urology, who noted the bleeding during an exam.  Pt has MS for 19 years.  Last PAP normal in 2018.  Menstrual History: OB History    Gravida  3   Para  1   Term      Preterm      AB  2   Living        SAB  2   TAB      Ectopic      Multiple      Live Births           Obstetric Comments  1st Menstrual Cycle:  9  1st Pregnancy:  19        No LMP recorded. Patient is postmenopausal.     PMHx: She  has a past medical history of Aortic valve disease, Bacterial endocarditis, Bilateral lower extremity edema, Breast cancer ([place]) (12-31-13), Cervical stenosis of spine, Herpes zoster, IBS (irritable bowel syndrome), Lymphedema, Multiple sclerosis ([place]) (2001), Neuromuscular disorder ([place]), Seizures ([place]), and Syncope and collapse. Also,  has a past surgical history that includes Cholecystectomy; Ankle surgery; Port-a-cath removal; Anterior cervical decomp/discectomy fusion (11/17/2011); transthoracic echocardiogram (03/2013; 02/2014); Lower extremity venous Dopplers (Feb 27, 2013); Port a cath insertion (Right, 01/19/2010); Colonoscopy (2014); Upper gi endoscopy (2014); Port-a-cath removal (Right, 09/03/2013); Breast biopsy (Right, 12-31-13); Breast surgery (Right, 02/03/2014); Ankle surgery; and Mastectomy (Right, 2015)., family history includes Breast cancer (age of onset: 80) in her maternal aunt; Breast cancer (age of onset: 26) in her maternal grandmother; Cancer in her father; Heart  attack in her father; Heart disease in her father; Ovarian cancer in her cousin; Thyroid disease in her sister.,  reports that she has never smoked. She has never used smokeless tobacco. She reports that she does not drink alcohol or use drugs.  She has a current medication list which includes the following prescription(s): amantadine, baclofen, assurance vinyl exam gloves, fluticasone, gluconic acid-citric acid, glucosamine-chondroitin, ibuprofen, interferon beta-1a, levetiracetam, loratadine, leg vein & circulation, hair skin and nails formula, multiple vitamin, preservision areds 2, oxybutynin, tizanidine, and turmeric. Also, is allergic to fentanyl and sulfa antibiotics.  Review of Systems  Constitutional: Positive for malaise/fatigue. Negative for chills and fever.  HENT: Negative for congestion, sinus pain and sore throat.   Eyes: Negative for blurred vision and pain.  Respiratory: Negative for cough and wheezing.   Cardiovascular: Negative for chest pain and leg swelling.  Gastrointestinal: Negative for abdominal pain, constipation, diarrhea, heartburn, nausea and vomiting.  Genitourinary: Negative for dysuria, frequency, hematuria and urgency.  Musculoskeletal: Negative for back pain, joint pain, myalgias and neck pain.  Skin: Negative for itching and rash.  Neurological: Positive for sensory change, focal weakness and weakness. Negative for dizziness and tremors.  Endo/Heme/Allergies: Does not bruise/bleed easily.  Psychiatric/Behavioral: Negative for depression. The patient is not nervous/anxious and does not have insomnia.     Objective: BP 130/80   Ht 5\' 4"  (1.626 m)   Wt 214 lb (97.1 kg)   BMI 36.73 kg/m  Physical Exam Constitutional:  General: She is not in acute distress.    Appearance: She is well-developed.  Genitourinary:     Pelvic exam was performed with patient supine.     Vagina and uterus normal.     No vaginal erythema or bleeding.     No cervical motion  tenderness, discharge, polyp or nabothian cyst.     Uterus is mobile.     Uterus is not enlarged.     No uterine mass detected.    Uterus is midaxial.     No right or left adnexal mass present.     Right adnexa not tender.     Left adnexa not tender.  HENT:     Head: Normocephalic and atraumatic.     Nose: Nose normal.  Abdominal:     General: There is no distension.     Palpations: Abdomen is soft.     Tenderness: There is no abdominal tenderness.  Musculoskeletal: Normal range of motion.  Neurological:     Mental Status: She is alert and oriented to person, place, and time.     Cranial Nerves: No cranial nerve deficit.  Skin:    General: Skin is warm and dry.     ASSESSMENT/PLAN:   Problem List Items Addressed This Visit    Post-menopausal bleeding    -  Primary   Relevant Orders   Cytology - PAP   Surgical pathology  Re-eval w PAP, also EMB to see about precancerous etiologies If normal, consider [place] (poor candidate due to height of table and positioning difficulties, consider at [facility]) Other causes of bleeding also discussed, incl atrophy   Endometrial Biopsy After discussion with the patient regarding her abnormal uterine bleeding I recommended that she proceed with an endometrial biopsy for further diagnosis. The risks, benefits, alternatives, and indications for an endometrial biopsy were discussed with the patient in detail. She understood the risks including infection, bleeding, cervical laceration and uterine perforation.  Verbal consent was obtained.   PROCEDURE NOTE:  [place] endometrial biopsy was performed using aseptic technique with iodine preparation. Difficult exam due to pt positioning (having weakness from MS; also obesity limits exam). The uterus was sounded to a length of 6 cm.  Adequate sampling was obtained with minimal blood loss.  The patient tolerated the procedure well.  Disposition will be pending pathology.  Barnett Applebaum, MD, [facility],  [facility] 09/26/2018  2:31 PM

## 2018-09-26 NOTE — Patient Instructions (Signed)
Endometrial Biopsy, Care After This sheet gives you information about how to care for yourself after your procedure. Your health care provider may also give you more specific instructions. If you have problems or questions, contact your health care provider. What can I expect after the procedure? After the procedure, it is common to have:  Mild cramping.  A small amount of vaginal bleeding for a few days. This is normal. Follow these instructions at home:   Take over-the-counter and prescription medicines only as told by your health care provider.  Do not douche, use tampons, or have sexual intercourse until your health care provider approves.  Return to your normal activities as told by your health care provider. Ask your health care provider what activities are safe for you.  Follow instructions from your health care provider about any activity restrictions, such as restrictions on strenuous exercise or heavy lifting. Contact a health care provider if:  You have heavy bleeding, or bleed for longer than 2 days after the procedure.  You have bad smelling discharge from your vagina.  You have a fever or chills.  You have a burning sensation when urinating or you have difficulty urinating.  You have severe pain in your lower abdomen. Get help right away if:  You have severe cramps in your stomach or back.  You pass large blood clots.  Your bleeding increases.  You become weak or light-headed, or you pass out. Summary  After the procedure, it is common to have mild cramping and a small amount of vaginal bleeding for a few days.  Do not douche, use tampons, or have sexual intercourse until your health care provider approves.  Return to your normal activities as told by your health care provider. Ask your health care provider what activities are safe for you. This information is not intended to replace advice given to you by your health care provider. Make sure you discuss any  questions you have with your health care provider. Document Released: 11/14/2012 Document Revised: 01/06/2017 Document Reviewed: 02/10/2016 Elsevier Patient Education  2020 [place].   Postmenopausal Bleeding  Postmenopausal bleeding is any bleeding that occurs after menopause. Menopause is when a woman's period stops. Any type of bleeding after menopause should be checked by your doctor. Treatment will depend on the cause. Follow these instructions at home:  Pay attention to any changes in your symptoms.  Avoid using tampons and douches as told by your doctor.  Change your pads regularly.  Get regular pelvic exams and Pap tests.  Take iron pills as told by your doctor.  Take over-the-counter and prescription medicines only as told by your doctor.  Keep all follow-up visits as told by your doctor. This is important. Contact a doctor if:  Your bleeding lasts for more than 1 week.  You have pain in your belly (abdomen).  You have bleeding during or after sex.  You have bleeding that happens more often than every 3 weeks. Get help right away if:  You have fever, chills, headache, dizziness, muscle aches, or bleeding.  You have very bad pain with bleeding.  You have clumps of blood (blood clots) coming from your vagina.  You have a lot of bleeding, and: ? You use more than 1 pad an hour. ? This kind of bleeding has never happened before.  You feel like you are going to pass out (faint). Summary  Any type of bleeding after menopause should be checked by your doctor.  Pay attention to any  changes in your symptoms.  Keep all follow-up visits as told by your doctor. This information is not intended to replace advice given to you by your health care provider. Make sure you discuss any questions you have with your health care provider. Document Released: 11/03/2007 Document Revised: 04/12/2018 Document Reviewed: 03/01/2016 Elsevier Patient Education  2020 [employer].

## 2018-09-27 DIAGNOSIS — N950 Postmenopausal bleeding: Secondary | ICD-10-CM | POA: Diagnosis not present

## 2018-09-28 LAB — CYTOLOGY - PAP: Diagnosis: NEGATIVE

## 2018-10-01 ENCOUNTER — Other Ambulatory Visit: Payer: Self-pay | Admitting: Obstetrics & Gynecology

## 2018-10-01 DIAGNOSIS — N950 Postmenopausal bleeding: Secondary | ICD-10-CM

## 2018-10-01 NOTE — Progress Notes (Unsigned)
PAP normal, EMB equivical as no endometrial tissue present Rec D&C To d/w pt  Barnett Applebaum, MD, [place] 10/01/2018  7:06 AM

## 2018-10-01 NOTE — Progress Notes (Signed)
Please schedule pelvic US at [facility] due to mobility restrictions here (She has MS).  Schedule surgery (see below) for anytime after the [place] is done.  Ideally, [place] and preop could be done same day.  Surgery Booking Request Patient Full Name:  Rebecca [name]  MRN: [id number]  DOB: 1960/09/14  Surgeon: Hoyt Koch, MD  Requested Surgery Date and Time: Any Primary Diagnosis AND Code: N95.0 Postmenopausal Bleeding Secondary Diagnosis and Code:  Surgical Procedure: Hysteroscopy D&C L&D Notification: No Admission Status: same day surgery Length of Surgery: 30 min Special Case Needs: She has MS, inability to stand or walk well H&P: yes (date) Phone Interview???: yes Interpreter: Language:  Medical Clearance: no Special Scheduling Instructions: no Acuity: P2 (cancer risk)

## 2018-10-02 ENCOUNTER — Other Ambulatory Visit: Payer: Self-pay

## 2018-10-02 ENCOUNTER — Encounter: Payer: Medicare Other | Attending: Physician Assistant | Admitting: Physician Assistant

## 2018-10-02 DIAGNOSIS — G40909 Epilepsy, unspecified, not intractable, without status epilepticus: Secondary | ICD-10-CM | POA: Insufficient documentation

## 2018-10-02 DIAGNOSIS — K589 Irritable bowel syndrome without diarrhea: Secondary | ICD-10-CM | POA: Diagnosis not present

## 2018-10-02 DIAGNOSIS — Z823 Family history of stroke: Secondary | ICD-10-CM | POA: Insufficient documentation

## 2018-10-02 DIAGNOSIS — Z885 Allergy status to narcotic agent status: Secondary | ICD-10-CM | POA: Diagnosis not present

## 2018-10-02 DIAGNOSIS — Z809 Family history of malignant neoplasm, unspecified: Secondary | ICD-10-CM | POA: Insufficient documentation

## 2018-10-02 DIAGNOSIS — Z833 Family history of diabetes mellitus: Secondary | ICD-10-CM | POA: Diagnosis not present

## 2018-10-02 DIAGNOSIS — G35 Multiple sclerosis: Secondary | ICD-10-CM | POA: Diagnosis not present

## 2018-10-02 DIAGNOSIS — L98419 Non-pressure chronic ulcer of buttock with unspecified severity: Secondary | ICD-10-CM | POA: Diagnosis not present

## 2018-10-02 DIAGNOSIS — Z8249 Family history of ischemic heart disease and other diseases of the circulatory system: Secondary | ICD-10-CM | POA: Diagnosis not present

## 2018-10-02 DIAGNOSIS — I890 Lymphedema, not elsewhere classified: Secondary | ICD-10-CM | POA: Diagnosis not present

## 2018-10-02 DIAGNOSIS — L89893 Pressure ulcer of other site, stage 3: Secondary | ICD-10-CM | POA: Diagnosis not present

## 2018-10-02 DIAGNOSIS — Z853 Personal history of malignant neoplasm of breast: Secondary | ICD-10-CM | POA: Insufficient documentation

## 2018-10-02 NOTE — Progress Notes (Signed)
SHAKARI, MARSE ([id number]) Visit Report for 10/02/2018 Abuse/Suicide Risk Screen Details Patient Name: Rebecca [name], Rebecca [name]. Date of Service: 10/02/2018 2:15 PM Medical Record Number: [id number] Patient Account Number: [id number] Date of Birth/Sex: 12-14-1960 (58 y.o. F) Treating RN: Army Melia Primary Care Jennifier Smitherman: PATIENT, NO Other Clinician: Referring Rini Moffit: Debroah Loop Treating Mamoru Takeshita/Extender: STONE III, HOYT Weeks in Treatment: 0 Abuse/Suicide Risk Screen Items Answer ABUSE RISK SCREEN: Has anyone close to you tried to hurt or harm you recentlyo No Do you feel uncomfortable with anyone in your familyo No Has anyone forced you do things that you didnot want to doo No Electronic Signature(s) Signed: 10/02/2018 3:43:41 PM By: Army Melia Entered By: Army Melia on 10/02/2018 14:43:28 Rebecca [name] ([id number]) -------------------------------------------------------------------------------- Activities of Daily Living Details Patient Name: Rebecca [name]. Date of Service: 10/02/2018 2:15 PM Medical Record Number: [id number] Patient Account Number: [id number] Date of Birth/Sex: 1960-10-13 (58 y.o. F) Treating RN: Army Melia Primary Care Makiya Jeune: PATIENT, NO Other Clinician: Referring Dariyon Urquilla: Debroah Loop Treating Masiyah Jorstad/Extender: STONE III, HOYT Weeks in Treatment: 0 Activities of Daily Living Items Answer Activities of Daily Living (Please select one for each item) Drive Automobile Not Able Take Medications Completely Able Use Telephone Completely Able Care for Appearance Completely Able Use Toilet Need Assistance Bath / Shower Need Assistance Dress Self Need Assistance Feed Self Completely Able Walk Need Assistance Get In / Out Bed Need Assistance Housework Need Assistance Prepare Meals Need Assistance Handle Money Completely Able Shop for Self Need Assistance Electronic Signature(s) Signed: 10/02/2018 3:43:41 PM By: Army Melia Entered By: Army Melia on 10/02/2018 14:43:52 Rebecca [name] ([id number]) -------------------------------------------------------------------------------- Education Screening Details Patient Name: Rebecca [name]. Date of Service: 10/02/2018 2:15 PM Medical Record Number: [id number] Patient Account Number: [id number] Date of Birth/Sex: 1960-06-01 (58 y.o. F) Treating RN: Army Melia Primary Care Jae Skeet: PATIENT, NO Other Clinician: Referring Denisse Whitenack: Debroah Loop Treating Yuvin Bussiere/Extender: Melburn Hake, HOYT Weeks in Treatment: 0 Primary Learner Assessed: Patient Learning Preferences/Education Level/Primary Language Learning Preference: Explanation, Demonstration Highest Education Level: High School Preferred Language: English Cognitive Barrier Language Barrier: No Translator Needed: No Memory Deficit: No Emotional Barrier: No Cultural/Religious Beliefs Affecting Medical Care: No Physical Barrier Impaired Vision: No Impaired Hearing: No Decreased Hand dexterity: No Knowledge/Comprehension Knowledge Level: High Comprehension Level: High Ability to understand written High instructions: Ability to understand verbal High instructions: Motivation Anxiety Level: Calm Cooperation: Cooperative Education Importance: Acknowledges Need Interest in Health Problems: Asks Questions Perception: Coherent Willingness to Engage in Self- High Management Activities: Readiness to Engage in Self- High Management Activities: Electronic Signature(s) Signed: 10/02/2018 3:43:41 PM By: Army Melia Entered By: Army Melia on 10/02/2018 14:44:10 Rebecca [name] ([id number]) -------------------------------------------------------------------------------- Fall Risk Assessment Details Patient Name: Rebecca [name]. Date of Service: 10/02/2018 2:15 PM Medical Record Number: [id number] Patient Account Number: [id number] Date of Birth/Sex: Jul 01, 1960 (58 y.o. F) Treating RN:  Army Melia Primary Care Morgan Rennert: PATIENT, NO Other Clinician: Referring Avonte Sensabaugh: Debroah Loop Treating Zenab Gronewold/Extender: Melburn Hake, HOYT Weeks in Treatment: 0 Fall Risk Assessment Items Have you had 2 or more falls in the last 12 monthso 0 No Have you had any fall that resulted in injury in the last 12 monthso 0 No FALLS RISK SCREEN History of falling - immediate or within 3 months 0 No Secondary diagnosis (Do you have 2 or more medical diagnoseso) 0 No Ambulatory aid None/bed rest/wheelchair/nurse 0 Yes Crutches/cane/walker 0 No Furniture 0 No Intravenous therapy Access/Saline/Heparin Lock 0 No Gait/Transferring Normal/ bed rest/ wheelchair 0 Yes Weak (  short steps with or without shuffle, stooped but able to lift head while 0 No walking, may seek support from furniture) Impaired (short steps with shuffle, may have difficulty arising from chair, head 0 No down, impaired balance) Mental Status Oriented to own ability 0 No Electronic Signature(s) Signed: 10/02/2018 3:43:41 PM By: Army Melia Entered By: Army Melia on 10/02/2018 14:44:39 Rebecca [name] ([id number]) -------------------------------------------------------------------------------- Nutrition Risk Screening Details Patient Name: Rebecca [name]. Date of Service: 10/02/2018 2:15 PM Medical Record Number: [id number] Patient Account Number: [id number] Date of Birth/Sex: 07-25-1960 (58 y.o. F) Treating RN: Army Melia Primary Care Lucious Zou: PATIENT, NO Other Clinician: Referring Dixie Jafri: Debroah Loop Treating Ashea Winiarski/Extender: STONE III, HOYT Weeks in Treatment: 0 Height (in): 64 Weight (lbs): 250 Body Mass Index (BMI): 42.9 Nutrition Risk Screening Items Score Screening NUTRITION RISK SCREEN: I have an illness or condition that made me change the kind and/or amount of 0 No food I eat I eat fewer than two meals per day 0 No I eat few fruits and vegetables, or milk products 0 No I  have three or more drinks of beer, liquor or wine almost every day 0 No I have tooth or mouth problems that make it hard for me to eat 0 No I don't always have enough money to buy the food I need 0 No I eat alone most of the time 0 No I take three or more different prescribed or over-the-counter drugs a day 0 No Without wanting to, I have lost or gained 10 pounds in the last six months 0 No I am not always physically able to shop, cook and/or feed myself 0 No Nutrition Protocols Good Risk Protocol 0 No interventions needed Moderate Risk Protocol High Risk Proctocol Risk Level: Good Risk Score: 0 Electronic Signature(s) Signed: 10/02/2018 3:43:41 PM By: Army Melia Entered By: Army Melia on 10/02/2018 14:44:45

## 2018-10-02 NOTE — Progress Notes (Addendum)
Rebecca [name] ([id number]) Visit Report for 10/02/2018 Allergy List Details Patient Name: Rebecca [name], Rebecca [name]. Date of Service: 10/02/2018 2:15 PM Medical Record Number: [id number] Patient Account Number: [id number] Date of Birth/Sex: August 01, 1960 (58 y.o. F) Treating RN: Army Melia Primary Care Keylah Darwish: Delsa Grana Other Clinician: Referring Doraine Schexnider: Delsa Grana Treating Isaiyah Feldhaus/Extender: STONE III, HOYT Weeks in Treatment: 0 Allergies Active Allergies fentanyl Reaction: nausea Severity: Moderate Allergy Notes Electronic Signature(s) Signed: 10/02/2018 3:43:41 PM By: Army Melia Entered By: Army Melia on 10/02/2018 14:40:56 Rebecca [name] ([id number]) -------------------------------------------------------------------------------- Arrival Information Details Patient Name: Rebecca [name]. Date of Service: 10/02/2018 2:15 PM Medical Record Number: [id number] Patient Account Number: [id number] Date of Birth/Sex: [birth year] (58 y.o. F) Treating RN: Army Melia Primary Care Telesia Ates: Delsa Grana Other Clinician: Referring Kerri Kovacik: Delsa Grana Treating Curtez Brallier/Extender: Melburn Hake, HOYT Weeks in Treatment: 0 Visit Information Patient Arrived: Wheel Chair Arrival Time: 14:29 Accompanied By: husband and caregiver Transfer Assistance: None History Since Last Visit Added or deleted any medications: No Any new allergies or adverse reactions: No Had a fall or experienced change in activities of daily living that may affect risk of falls: No Signs or symptoms of abuse/neglect since last visito No Hospitalized since last visit: No Has Dressing in Place as Prescribed: Yes Electronic Signature(s) Signed: 10/02/2018 3:43:41 PM By: Army Melia Entered By: Army Melia on 10/02/2018 14:29:47 Rebecca [name] ([id number]) -------------------------------------------------------------------------------- Clinic Level of Care Assessment Details Patient Name: Rebecca [name]. Date of  Service: 10/02/2018 2:15 PM Medical Record Number: [id number] Patient Account Number: [id number] Date of Birth/Sex: 1960-06-19 (58 y.o. F) Treating RN: Montey Hora Primary Care Loralye Loberg: Delsa Grana Other Clinician: Referring Lorilyn Laitinen: Delsa Grana Treating Exa Bomba/Extender: Melburn Hake, HOYT Weeks in Treatment: 0 Clinic Level of Care Assessment Items TOOL 2 Quantity Score []  - Use when only an EandM is performed on the INITIAL visit 0 ASSESSMENTS - Nursing Assessment / Reassessment X - General Physical Exam (combine w/ comprehensive assessment (listed just below) when 1 20 performed on new pt. evals) X- 1 25 Comprehensive Assessment (HX, ROS, Risk Assessments, Wounds Hx, etc.) ASSESSMENTS - Wound and Skin Assessment / Reassessment X - Simple Wound Assessment / Reassessment - one wound 1 5 []  - 0 Complex Wound Assessment / Reassessment - multiple wounds []  - 0 Dermatologic / Skin Assessment (not related to wound area) ASSESSMENTS - Ostomy and/or Continence Assessment and Care []  - Incontinence Assessment and Management 0 []  - 0 Ostomy Care Assessment and Management (repouching, etc.) PROCESS - Coordination of Care X - Simple Patient / Family Education for ongoing care 1 15 []  - 0 Complex (extensive) Patient / Family Education for ongoing care X- 1 10 Staff obtains Programmer, systems, Records, Test Results / Process Orders []  - 0 Staff telephones HHA, Nursing Homes / Clarify orders / etc []  - 0 Routine Transfer to another Facility (non-emergent condition) []  - 0 Routine Hospital Admission (non-emergent condition) []  - 0 New Admissions / Biomedical engineer / Ordering NPWT, Apligraf, etc. []  - 0 Emergency Hospital Admission (emergent condition) X- 1 10 Simple Discharge Coordination []  - 0 Complex (extensive) Discharge Coordination PROCESS - Special Needs []  - Pediatric / Minor Patient Management 0 []  - 0 Isolation Patient Management LAKETTA, RUDEL. ([id number]) []  -  0 Hearing / Language / Visual special needs []  - 0 Assessment of Community assistance (transportation, D/C planning, etc.) []  - 0 Additional assistance / Altered mentation []  - 0 Support Surface(s) Assessment (bed, cushion, seat, etc.) INTERVENTIONS - Wound Cleansing / Measurement  X - Wound Imaging (photographs - any number of wounds) 1 5 []  - 0 Wound Tracing (instead of photographs) X- 1 5 Simple Wound Measurement - one wound []  - 0 Complex Wound Measurement - multiple wounds X- 1 5 Simple Wound Cleansing - one wound []  - 0 Complex Wound Cleansing - multiple wounds INTERVENTIONS - Wound Dressings X - Small Wound Dressing one or multiple wounds 1 10 []  - 0 Medium Wound Dressing one or multiple wounds []  - 0 Large Wound Dressing one or multiple wounds []  - 0 Application of Medications - injection INTERVENTIONS - Miscellaneous []  - External ear exam 0 []  - 0 Specimen Collection (cultures, biopsies, blood, body fluids, etc.) []  - 0 Specimen(s) / Culture(s) sent or taken to Lab for analysis X- 1 10 Patient Transfer (multiple staff / Civil Service fast streamer / Similar devices) []  - 0 Simple Staple / Suture removal (25 or less) []  - 0 Complex Staple / Suture removal (26 or more) []  - 0 Hypo / Hyperglycemic Management (close monitor of Blood Glucose) X- 1 15 Ankle / Brachial Index (ABI) - do not check if billed separately Has the patient been seen at the hospital within the last three years: Yes Total Score: 135 Level Of Care: New/Established - Level 4 Electronic Signature(s) Signed: 10/02/2018 4:25:23 PM By: Montey Hora Entered By: Montey Hora on 10/02/2018 14:58:21 Rebecca [name] ([id number]) -------------------------------------------------------------------------------- Encounter Discharge Information Details Patient Name: Rebecca [name]. Date of Service: 10/02/2018 2:15 PM Medical Record Number: [id number] Patient Account Number: [id number] Date of Birth/Sex: 1960/12/24 (58  y.o. F) Treating RN: Montey Hora Primary Care Litisha Guagliardo: Delsa Grana Other Clinician: Referring Atiba Kimberlin: Delsa Grana Treating Naryiah Schley/Extender: Melburn Hake, HOYT Weeks in Treatment: 0 Encounter Discharge Information Items Post Procedure Vitals Discharge Condition: Stable Temperature (F): 97.9 Ambulatory Status: Wheelchair Pulse (bpm): 72 Discharge Destination: Home Respiratory Rate (breaths/min): 16 Transportation: Private Auto Blood Pressure (mmHg): 145/69 Accompanied By: spouse Schedule Follow-up Appointment: Yes Clinical Summary of Care: Electronic Signature(s) Signed: 10/02/2018 4:25:23 PM By: Montey Hora Entered By: Montey Hora on 10/02/2018 15:06:16 Rebecca [name] ([id number]) -------------------------------------------------------------------------------- Lower Extremity Assessment Details Patient Name: Rebecca [name]. Date of Service: 10/02/2018 2:15 PM Medical Record Number: [id number] Patient Account Number: [id number] Date of Birth/Sex: 10-19-1960 (58 y.o. F) Treating RN: Army Melia Primary Care Cristian Davitt: Delsa Grana Other Clinician: Referring Rooney Swails: Delsa Grana Treating Dejanira Pamintuan/Extender: Melburn Hake, HOYT Weeks in Treatment: 0 Electronic Signature(s) Signed: 10/02/2018 3:43:41 PM By: Army Melia Entered By: Army Melia on 10/02/2018 14:40:11 Rebecca [name] ([id number]) -------------------------------------------------------------------------------- Multi Wound Chart Details Patient Name: Rebecca [name]. Date of Service: 10/02/2018 2:15 PM Medical Record Number: [id number] Patient Account Number: [id number] Date of Birth/Sex: 07-09-1960 (58 y.o. F) Treating RN: Montey Hora Primary Care Carah Barrientes: Delsa Grana Other Clinician: Referring Mikia Delaluz: Delsa Grana Treating Raymon Schlarb/Extender: Melburn Hake, HOYT Weeks in Treatment: 0 Vital Signs Height(in): 64 Pulse(bpm): 78 Weight(lbs): 250 Blood Pressure(mmHg): 145/69 Body Mass Index(BMI):  43 Temperature(F): 97.9 Respiratory Rate 16 (breaths/min): Photos: [N/A:N/A] Wound Location: Left Upper Leg - Posterior N/A N/A Wounding Event: Pressure Injury N/A N/A Primary Etiology: Pressure Ulcer N/A N/A Comorbid History: Arrhythmia, Seizure Disorder, N/A N/A Received Chemotherapy Date Acquired: 09/25/2018 N/A N/A Weeks of Treatment: 0 N/A N/A Wound Status: Open N/A N/A Measurements L x W x D 3.2x0.5x0.2 N/A N/A (cm) Area (cm) : 1.257 N/A N/A Volume (cm) : 0.251 N/A N/A % Reduction in Area: 0.00% N/A N/A % Reduction in Volume: 0.00% N/A N/A Classification: Category/Stage III N/A N/A Exudate  Amount: Medium N/A N/A Exudate Type: Serosanguineous N/A N/A Exudate Color: red, brown N/A N/A Wound Margin: Flat and Intact N/A N/A Granulation Amount: Large (67-100%) N/A N/A Granulation Quality: Red N/A N/A Necrotic Amount: None Present (0%) N/A N/A Exposed Structures: Fat Layer (Subcutaneous N/A N/A Tissue) Exposed: Yes Fascia: No Tendon: No Muscle: No Joint: No Bone: No KISSA, HOOLE ([id number]) Epithelialization: None N/A N/A Debridement: Chemical/Enzymatic/Mechanical N/A N/A Pre-procedure 14:55 N/A N/A Verification/Time Out Taken: Pain Control: Lidocaine 4% Topical Solution N/A N/A Instrument: Other(saline and gauze) N/A N/A Bleeding: None N/A N/A Procedural Pain: 0 N/A N/A Post Procedural Pain: 0 N/A N/A Debridement Treatment Procedure was tolerated well N/A N/A Response: Post Debridement 3.2x0.5x0.2 N/A N/A Measurements L x W x D (cm) Post Debridement Volume: 0.251 N/A N/A (cm) Post Debridement Stage: Category/Stage III N/A N/A Procedures Performed: Debridement N/A N/A Treatment Notes Wound #2 (Left, Posterior Upper Leg) Notes hydrafera bue, BFD Electronic Signature(s) Signed: 10/23/2018 1:27:19 PM By: Montey Hora Previous Signature: 10/02/2018 4:25:23 PM Version By: Montey Hora Entered By: Montey Hora on 10/23/2018 13:27:19 Rebecca [name] ([id number]) -------------------------------------------------------------------------------- [place] Details Patient Name: ROSETTER, LOCH. Date of Service: 10/02/2018 2:15 PM Medical Record Number: [id number] Patient Account Number: [id number] Date of Birth/Sex: [birth year] (58 y.o. F) Treating RN: Montey Hora Primary Care Lekisha Mcghee: Delsa Grana Other Clinician: Referring Primitivo Merkey: Delsa Grana Treating Christmas Faraci/Extender: Melburn Hake, HOYT Weeks in Treatment: 0 Active Inactive Abuse / Safety / Falls / Self Care Management Nursing Diagnoses: Potential for falls Goals: Patient will remain injury free related to falls Date Initiated: 10/02/2018 Target Resolution Date: 12/15/2018 Goal Status: Active Interventions: Assess fall risk on admission and as needed Notes: Orientation to the Wound Care Program Nursing Diagnoses: Knowledge deficit related to the wound healing center program Goals: Patient/caregiver will verbalize understanding of the [facility] Date Initiated: 10/02/2018 Target Resolution Date: 12/15/2018 Goal Status: Active Interventions: Provide education on orientation to the wound center Notes: Pressure Nursing Diagnoses: Knowledge deficit related to causes and risk factors for pressure ulcer development Goals: Patient will remain free from development of additional pressure ulcers Date Initiated: 10/02/2018 Target Resolution Date: 12/15/2018 Goal Status: Active Interventions: Provide education on pressure ulcers INAYA, KIGHT. ([id number]) Notes: Wound/Skin Impairment Nursing Diagnoses: Impaired tissue integrity Goals: Ulcer/skin breakdown will heal within 14 weeks Date Initiated: 10/02/2018 Target Resolution Date: 12/15/2018 Goal Status: Active Interventions: Assess patient/caregiver ability to obtain necessary supplies Assess patient/caregiver ability to perform ulcer/skin care regimen upon admission and as  needed Assess ulceration(s) every visit Notes: Electronic Signature(s) Signed: 10/02/2018 4:25:23 PM By: Montey Hora Entered By: Montey Hora on 10/02/2018 14:54:08 Rebecca [name] ([id number]) -------------------------------------------------------------------------------- Pain Assessment Details Patient Name: Rebecca [name]. Date of Service: 10/02/2018 2:15 PM Medical Record Number: [id number] Patient Account Number: [id number] Date of Birth/Sex: [birth year] (58 y.o. F) Treating RN: Army Melia Primary Care Meeya Goldin: Delsa Grana Other Clinician: Referring Cuauhtemoc Huegel: Delsa Grana Treating Jasmia Angst/Extender: Melburn Hake, HOYT Weeks in Treatment: 0 Active Problems Location of Pain Severity and Description of Pain Patient Has Paino No Site Locations Pain Management and Medication Current Pain Management: Electronic Signature(s) Signed: 10/02/2018 3:43:41 PM By: Army Melia Entered By: Army Melia on 10/02/2018 14:29:53 Rebecca [name] ([id number]) -------------------------------------------------------------------------------- Patient/Caregiver Education Details Patient Name: Rebecca [name]. Date of Service: 10/02/2018 2:15 PM Medical Record Number: [id number] Patient Account Number: [id number] Date of Birth/Gender: May 15, 1960 (58 y.o. F) Treating RN: Montey Hora Primary Care Physician: Delsa Grana Other Clinician: Referring Physician: Delsa Grana Treating  Physician/Extender: Sharalyn Ink in Treatment: 0 Education Assessment Education Provided To: Patient and Caregiver Education Topics Provided Wound/Skin Impairment: Handouts: Other: wound care as ordered Methods: Demonstration, Explain/Verbal Responses: State content correctly Electronic Signature(s) Signed: 10/02/2018 4:25:23 PM By: Montey Hora Entered By: Montey Hora on 10/02/2018 15:05:08 Rebecca [name]  ([id number]) -------------------------------------------------------------------------------- Wound Assessment Details Patient Name: Rebecca [name]. Date of Service: 10/02/2018 2:15 PM Medical Record Number: [id number] Patient Account Number: [id number] Date of Birth/Sex: [birth year] (58 y.o. F) Treating RN: Army Melia Primary Care Delando Satter: Delsa Grana Other Clinician: Referring Estefany Goebel: Delsa Grana Treating Eri Mcevers/Extender: Melburn Hake, HOYT Weeks in Treatment: 0 Wound Status Wound Number: 2 Primary Pressure Ulcer Etiology: Wound Location: Left Upper Leg - Posterior Wound Status: Open Wounding Event: Pressure Injury Comorbid Arrhythmia, Seizure Disorder, Received Date Acquired: 09/25/2018 History: Chemotherapy Weeks Of Treatment: 0 Clustered Wound: No Photos Wound Measurements Length: (cm) 3.2 % Reduction i Width: (cm) 0.5 % Reduction i Depth: (cm) 0.2 Epithelializa Area: (cm) 1.257 Tunneling: Volume: (cm) 0.251 Undermining: n Area: 0% n Volume: 0% tion: None No No Wound Description Classification: Category/Stage III Foul Odor Af Wound Margin: Flat and Intact Slough/Fibri Exudate Amount: Medium Exudate Type: Serosanguineous Exudate Color: red, brown ter Cleansing: No no No Wound Bed Granulation Amount: Large (67-100%) Exposed Structure Granulation Quality: Red Fascia Exposed: No Necrotic Amount: None Present (0%) Fat Layer (Subcutaneous Tissue) Exposed: Yes Tendon Exposed: No Muscle Exposed: No Joint Exposed: No Bone Exposed: No Electronic Signature(s) LATESIA, KRISHNAMOORTHY ([id number]) Signed: 10/23/2018 12:08:16 PM By: Gretta Cool, BSN, RN, CWS, Kim RN, BSN Signed: 11/01/2018 10:28:55 AM By: Army Melia Previous Signature: 10/02/2018 3:43:41 PM Version By: Army Melia Previous Signature: 10/02/2018 4:25:23 PM Version By: Montey Hora Entered By: Gretta Cool, BSN, RN, CWS, Kim on 10/23/2018 12:08:16 Rebecca [name]  ([id number]) -------------------------------------------------------------------------------- Vitals Details Patient Name: LASONYA, OHAIRE. Date of Service: 10/02/2018 2:15 PM Medical Record Number: [id number] Patient Account Number: [id number] Date of Birth/Sex: 1960/04/26 (58 y.o. F) Treating RN: Army Melia Primary Care Annaleese Guier: Delsa Grana Other Clinician: Referring Natalye Kott: Delsa Grana Treating Rashada Klontz/Extender: Melburn Hake, HOYT Weeks in Treatment: 0 Vital Signs Time Taken: 14:29 Temperature (F): 97.9 Height (in): 64 Pulse (bpm): 78 Source: Stated Respiratory Rate (breaths/min): 16 Weight (lbs): 250 Blood Pressure (mmHg): 145/69 Source: Stated Reference Range: 80 - 120 mg / dl Body Mass Index (BMI): 42.9 Electronic Signature(s) Signed: 10/02/2018 3:43:41 PM By: Army Melia Entered By: Army Melia on 10/02/2018 14:30:53

## 2018-10-04 ENCOUNTER — Telehealth: Payer: Self-pay | Admitting: Obstetrics & Gynecology

## 2018-10-04 NOTE — Telephone Encounter (Signed)
-----   Message from Gae Dry, MD sent at 10/01/2018  7:43 AM EDT ----- Please schedule pelvic US at [facility] due to mobility restrictions here (She has MS).  Schedule surgery (see below) for anytime after the [place] is done.  Ideally, [place] and preop could be done same day.  Surgery Booking Request Patient Full Name:  Rebecca [name]  MRN: [id number]  DOB: Feb 09, 1960  Surgeon: Hoyt Koch, MD  Requested Surgery Date and Time: Any Primary Diagnosis AND Code: N95.0 Postmenopausal Bleeding Secondary Diagnosis and Code:  Surgical Procedure: Hysteroscopy D&C L&D Notification: No Admission Status: same day surgery Length of Surgery: 30 min Special Case Needs: She has MS, inability to stand or walk well H&P: yes (date) Phone Interview???: yes Interpreter: Language:  Medical Clearance: no Special Scheduling Instructions: no Acuity: P2 (cancer risk)

## 2018-10-04 NOTE — Progress Notes (Addendum)
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 Left,Posterior Upper Leg: [place] for [facility] Nurse may visit PRN to address patient s wound care needs. FACE TO FACE ENCOUNTER: MEDICARE and MEDICAID PATIENTS: I certify that this patient is under my care and that I had a face-to-face encounter that meets the physician face-to-face encounter requirements with this patient on this date. The encounter with the patient was in whole or in part for the following MEDICAL CONDITION: (primary reason for [place]) MEDICAL NECESSITY: I certify, that based  on my findings, NURSING services are a medically necessary home health service. HOME BOUND STATUS: I certify that my clinical findings support that this patient is homebound (i.e., Due  to illness or injury, pt requires aid of supportive devices such as crutches, cane, wheelchairs, walkers, the use of special transportation or the assistance of another person to leave their place of residence. There is a normal inability to leave the home and doing so requires considerable and taxing effort. Other absences are for medical reasons / religious services and are infrequent or of short duration when for other reasons). If current dressing causes regression in wound condition, may D/C ordered dressing product/s and apply Normal Saline Moist Dressing daily until next [place] / Other MD appointment. [place] of regression in wound condition at [phone]. Please direct any NON-WOUND related issues/requests for orders to patient's Primary Care Physician 1. I would recommend that we go ahead and initiate treatment for the patient today with a Hydrofera Blue dressing as I feel like this is likely to be the best thing for her she has a little bit of hyper granulation noted but in general I think is just can be a good absorptive dressing to help pad the area. 2. I recommend a border foam dressing over top of this in order to both cushion as well as help out with catching any drainage that is occurring. 3. I am also going to suggest that the patient undertake appropriate offloading including turning and repositioning every 2 hours. I explained that she does not want to sit in her lift chair for extended periods of time past 2 hours as this will cause additional damage. 4. I am in a suggest as well that we go ahead and initiate home health as soon as possible for her the husband would like to have help with this as would the patient. We will see patient back for reevaluation in 2 weeks here in the clinic. If anything worsens or changes patient will contact our office for additional recommendations. Rebecca [name], Rebecca [name] ([id number]) Electronic  Signature(s) Signed: 10/23/2018 12:08:50 PM By: Gretta Cool BSN, RN, CWS, Kim RN, BSN Signed: 10/25/2018 2:06:07 AM By: Worthy Keeler PA-C Previous Signature: 10/03/2018 9:36:31 AM Version By: Worthy Keeler PA-C Entered By: Gretta Cool BSN, RN, CWS, Kim on 10/23/2018 12:08:50 Rebecca [name] ([id number]) -------------------------------------------------------------------------------- ROS/PFSH Details Patient Name: Rebecca [name], Rebecca [name]. Date of Service: 10/02/2018 2:15 PM Medical Record Number: [id number] Patient Account Number: [id number] Date of Birth/Sex: [birth year] (58 y.o. F) Treating RN: Army Melia Primary Care Provider: PATIENT, NO Other Clinician: Referring Provider: Debroah Loop Treating Provider/Extender: STONE III, HOYT Weeks in Treatment: 0 Information Obtained From Patient Constitutional Symptoms (General Health) Complaints and Symptoms: Negative for: Fatigue; Fever; Chills; Marked Weight Change Eyes Complaints and Symptoms: Positive for: Glasses / Contacts - glasses Medical History: Negative for: Cataracts; Glaucoma; Optic Neuritis Ear/Nose/Mouth/Throat Complaints and Symptoms: Negative for: Difficult clearing ears; Sinusitis Medical History: Negative for: Chronic sinus problems/congestion; Middle ear problems Hematologic/Lymphatic Complaints and Symptoms: Negative for: Bleeding / Clotting Disorders; Human Immunodeficiency Virus Medical History: Negative for: Anemia; Hemophilia; Human Immunodeficiency Virus; Lymphedema; Sickle Cell Disease Respiratory Complaints and Symptoms: Negative for: Chronic or frequent coughs; Shortness of Breath Medical History: Negative for: Aspiration; Asthma; Chronic Obstructive Pulmonary Disease (COPD); Pneumothorax; Sleep Apnea; Tuberculosis Cardiovascular Complaints and Symptoms: Negative for: Chest pain; LE edema Medical History: Positive for: Arrhythmia Negative for: Angina; Congestive Heart Failure; Coronary Artery Disease; Deep  Vein Thrombosis; Hypertension; Hypotension; Myocardial  Infarction; Peripheral Arterial Disease; Peripheral Venous Disease; Phlebitis; Vasculitis Past Medical History Notes: Rebecca [name], Rebecca [name] ([id number]) aortic valve disease, bacterial endocarditis, heart murmur Gastrointestinal Complaints and Symptoms: Negative for: Frequent diarrhea; Nausea; Vomiting Medical History: Negative for: Cirrhosis ; Colitis; Crohnos; Hepatitis A; Hepatitis B; Hepatitis C Past Medical History Notes: Irritable bowel syndrome Endocrine Complaints and Symptoms: Negative for: Hepatitis; Thyroid disease; Polydypsia (Excessive Thirst) Medical History: Negative for: Type I Diabetes; Type II Diabetes Genitourinary Complaints and Symptoms: Negative for: Kidney failure/ Dialysis; Incontinence/dribbling Medical History: Negative for: End Stage Renal Disease Immunological Complaints and Symptoms: Negative for: Hives; Itching Medical History: Negative for: Lupus Erythematosus; Raynaudos; Scleroderma Integumentary (Skin) Complaints and Symptoms: Negative for: Wounds; Bleeding or bruising tendency; Breakdown; Swelling Medical History: Negative for: History of Burn; History of pressure wounds Musculoskeletal Complaints and Symptoms: Negative for: Muscle Pain; Muscle Weakness Medical History: Negative for: Gout; Rheumatoid Arthritis; Osteoarthritis; Osteomyelitis Neurologic Complaints and Symptoms: Negative for: Numbness/parasthesias; Focal/Weakness Medical History: Positive for: Seizure Disorder - on kepra Negative for: Dementia; Neuropathy; Quadriplegia; Paraplegia Rebecca [name], Rebecca [name] ([id number]) Past Medical History Notes: Multiple Sclerosis Psychiatric Complaints and Symptoms: Negative for: Anxiety; Claustrophobia Medical History: Negative for: Anorexia/bulimia; Confinement Anxiety Oncologic Complaints and Symptoms: Review of System Notes: right breast cancer Medical History: Positive for: Received  Chemotherapy Negative for: Received Radiation Past Medical History Notes: breast cancer Immunizations Pneumococcal Vaccine: Received Pneumococcal Vaccination: No Immunization Notes: patient does not remember Implantable Devices None Hospitalization / Surgery History Type of Hospitalization/Surgery colonsocopy cholecytectomy right mastectomy Family and Social History Cancer: Yes - Mother,Father; Diabetes: Yes - Mother; Heart Disease: Yes - Father,Mother; Hereditary Spherocytosis: No; Hypertension: Yes - Siblings; Kidney Disease: No; Lung Disease: No; Seizures: No; Stroke: Yes - Mother,Father; Thyroid Problems: Yes - Siblings; Tuberculosis: No; Never smoker; Marital Status - Married; Alcohol Use: Never; Drug Use: No History; Caffeine Use: Daily - coffee; Financial Concerns: No; Food, Clothing or Shelter Needs: No; Support System Lacking: No; Transportation Concerns: No Electronic Signature(s) Signed: 10/02/2018 3:43:41 PM By: Army Melia Signed: 10/03/2018 11:36:49 PM By: Worthy Keeler PA-C Entered By: Army Melia on 10/02/2018 14:43:21 Rebecca [name] ([id number]) -------------------------------------------------------------------------------- SuperBill Details Patient Name: Rebecca [name]. Date of Service: 10/02/2018 Medical Record Number: [id number] Patient Account Number: [id number] Date of Birth/Sex: [birth year] (58 y.o. F) Treating RN: Montey Hora Primary Care Provider: PATIENT, NO Other Clinician: Referring Provider: Debroah Loop Treating Provider/Extender: STONE III, HOYT Weeks in Treatment: 0 Diagnosis Coding ICD-10 Codes Code Description L89.893 Pressure ulcer of other site, stage 3 G35 Multiple sclerosis G40.89 Other seizures M62.81 Muscle weakness (generalized) Facility Procedures CPT4 Code: [id number] Description: 99214 - WOUND CARE VISIT-LEV 4 EST PT Modifier: Quantity: 1 Physician Procedures CPT4 Code: [id number] Description: [id number] - WC PHYS  LEVEL 4 - NEW PT ICD-10 Diagnosis Description L89.893 Pressure ulcer of other site, stage 3 G35 Multiple sclerosis G40.89 Other seizures M62.81 Muscle weakness (generalized) Modifier: Quantity: 1 Electronic Signature(s) Signed: 10/03/2018 11:36:49 PM By: Worthy Keeler PA-C Previous Signature: 10/02/2018 4:25:23 PM Version By: Montey Hora Entered By: Worthy Keeler on 10/03/2018 00:02:55

## 2018-10-04 NOTE — Telephone Encounter (Signed)
Patient aware of date, location and time of appointment. Patient knows to drink one hour prior to appointment 32 oz of water for full bladder

## 2018-10-04 NOTE — Telephone Encounter (Signed)
Patient is aware of H&P at [facility] on 11/20/18 @ 1:30pm, Pelvic u/s at [facility] to be scheduled, Pre-admit testing phone interview to be scheduled, Covid testing on 11/23/18, and OR on 11/27/18. Patient is aware she will be asked to quarantine after Covid testing. Patient is aware she may receive calls from the [facility]. Patient confirmed Medicare and Medicaid, and and gave new policy info effective 0000000 of [facility] Medicare Dual Complete, ID# [id number], Grp# [id number]. Patient will bring card to be scanned in at next appointment.

## 2018-10-04 NOTE — Telephone Encounter (Signed)
Lmtrc

## 2018-10-05 DIAGNOSIS — Z9010 Acquired absence of unspecified breast and nipple: Secondary | ICD-10-CM | POA: Diagnosis not present

## 2018-10-05 DIAGNOSIS — Z466 Encounter for fitting and adjustment of urinary device: Secondary | ICD-10-CM | POA: Diagnosis not present

## 2018-10-05 DIAGNOSIS — G35 Multiple sclerosis: Secondary | ICD-10-CM | POA: Diagnosis not present

## 2018-10-05 DIAGNOSIS — N319 Neuromuscular dysfunction of bladder, unspecified: Secondary | ICD-10-CM | POA: Diagnosis not present

## 2018-10-05 DIAGNOSIS — Z9181 History of falling: Secondary | ICD-10-CM | POA: Diagnosis not present

## 2018-10-05 DIAGNOSIS — L89893 Pressure ulcer of other site, stage 3: Secondary | ICD-10-CM | POA: Diagnosis not present

## 2018-10-05 DIAGNOSIS — Z853 Personal history of malignant neoplasm of breast: Secondary | ICD-10-CM | POA: Diagnosis not present

## 2018-10-05 DIAGNOSIS — G40909 Epilepsy, unspecified, not intractable, without status epilepticus: Secondary | ICD-10-CM | POA: Diagnosis not present

## 2018-10-07 ENCOUNTER — Other Ambulatory Visit: Payer: Self-pay | Admitting: Nurse Practitioner

## 2018-10-16 ENCOUNTER — Ambulatory Visit: Payer: Medicare Other | Admitting: Physician Assistant

## 2018-10-18 ENCOUNTER — Telehealth: Payer: Self-pay | Admitting: Family Medicine

## 2018-10-18 NOTE — Telephone Encounter (Signed)
Rebecca [name] from [facility] called wanting verbal order on pt for 1 wk 1 for an evaluation for medical/Social Worker call back # is [phone]

## 2018-10-19 ENCOUNTER — Ambulatory Visit: Payer: Medicare Other | Admitting: Family Medicine

## 2018-10-19 ENCOUNTER — Telehealth: Payer: Self-pay | Admitting: Family Medicine

## 2018-10-19 ENCOUNTER — Other Ambulatory Visit: Payer: Self-pay

## 2018-10-19 NOTE — Telephone Encounter (Signed)
Rebecca [name] is returning Rebecca [name]

## 2018-10-19 NOTE — Telephone Encounter (Signed)
Rebecca [name],  Yes please go ahead and give my VO for the [facility], lets get any paper work done that we can.  Thank you

## 2018-10-19 NOTE — Telephone Encounter (Signed)
Medical evualuation for Education officer, museum.  Pt husband was in ER and when he is not there she has no one to care for her

## 2018-10-19 NOTE — Telephone Encounter (Signed)
Pt no showed appt and called liz back left voicemail

## 2018-10-19 NOTE — Telephone Encounter (Signed)
Yes please give verbal order from me for the [facility] and/or SW eval Pt may need visit to get this approved, but we can try since she's already established, her dx are well known. I'm so sorry for her predicament

## 2018-10-19 NOTE — Telephone Encounter (Signed)
Pt is calling and [facility]  will fax an order to increase pt days of the week to 7 day  2.5 hrs a day. The agency will help her with bathing, every day living assistant. [facility] phone number  [phone]

## 2018-10-22 ENCOUNTER — Other Ambulatory Visit: Payer: Self-pay

## 2018-10-22 ENCOUNTER — Encounter: Payer: Self-pay | Admitting: Family Medicine

## 2018-10-22 ENCOUNTER — Ambulatory Visit (INDEPENDENT_AMBULATORY_CARE_PROVIDER_SITE_OTHER): Payer: Medicare Other | Admitting: Family Medicine

## 2018-10-22 VITALS — BP 137/68 | Temp 97.7°F

## 2018-10-22 DIAGNOSIS — R262 Difficulty in walking, not elsewhere classified: Secondary | ICD-10-CM

## 2018-10-22 DIAGNOSIS — G35 Multiple sclerosis: Secondary | ICD-10-CM

## 2018-10-22 DIAGNOSIS — G40909 Epilepsy, unspecified, not intractable, without status epilepticus: Secondary | ICD-10-CM

## 2018-10-22 DIAGNOSIS — R269 Unspecified abnormalities of gait and mobility: Secondary | ICD-10-CM

## 2018-10-22 DIAGNOSIS — G801 Spastic diplegic cerebral palsy: Secondary | ICD-10-CM

## 2018-10-22 DIAGNOSIS — Z741 Need for assistance with personal care: Secondary | ICD-10-CM

## 2018-10-22 DIAGNOSIS — G8220 Paraplegia, unspecified: Secondary | ICD-10-CM

## 2018-10-22 NOTE — Progress Notes (Signed)
Name: Rebecca [name]   MRN: [id number]    DOB: [birth year]   Date:10/22/2018       Progress Note  Subjective:    Chief Complaint  Chief Complaint  Patient presents with  . Follow-up    6 month    I connected with  Rebecca [name] on 10/22/18 at  8:40 AM EDT by telephone and verified that I am speaking with the correct person using two identifiers.  I discussed the limitations, risks, security and privacy concerns of performing an evaluation and management service by telephone and the availability of in person appointments. Staff also discussed with the patient that there may be a patient responsible charge related to this service. Patient Location: home Provider Location: [facility] clinic Additional Individuals present: none  HPI  Pt presents to transition care to me, since her PCP left the practice, she also has a change in need for [place] at home, and we needed to do a face to face for this. She had some difficulty with connecting to video, phone call visit was done, and we have tried several times to reconnect to make sure we can see each other through video Pt is managed by specialists neuro - Posey Pronto and [facility] for MS neuro and rehabilitation MD She is at home after extensive hospitalization and rehab, and then she finally was able to go home with home therapy and help from her husband, but husband has unfortunately become ill and has been hospitalized.  She now requires daily help due to his absence and when he does return home, he is too ill himself and too weak to continue to care for her the way he has in the past.    Patient Active Problem List   Diagnosis Date Noted  . Ambulatory dysfunction 02/02/2018  . Abnormality of gait 11/17/2017  . 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 Bilateral lower extremity pain (primary) (bilateral) (right greater than left) 08/01/2016  . Chronic neck pain (secondary) (bilateral) ( left greater than right) 07/28/2016  .  Long term current use of opiate analgesic 07/28/2016  . Long term prescription opiate use 07/28/2016  . Opiate use 07/28/2016  . Neutropenia ([place]) 06/27/2016  . Chronic pain syndrome 06/03/2016  . Adjustment disorder with mixed anxiety and depressed mood   . Neurogenic bladder   . Neurogenic bowel   . Detrusor and sphincter dyssynergia   . Urinary incontinence   . Seizure disorder ([place])   . Slow transit constipation   . 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 Lymphedema   . Hypoalbuminemia due to protein-calorie malnutrition ([place])   . Encounter for screening for cervical cancer  11/27/2015  . Preventative health care 11/25/2015  . History of breast cancer in female 10/28/2015  . Anemia 10/28/2015  . Thrombocytopenia ([place]) 10/28/2015  . Allergic rhinitis 10/28/2015  . IBS (irritable bowel syndrome) 10/28/2015  . 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 History of right mastectomy 10/24/2014  . Medicare annual wellness visit, subsequent 10/24/2014  . MS (multiple sclerosis) ([place]) 09/16/2014  . Hematoma complicating a procedure 03/06/2014  . Primary cancer of right female breast ([place]) 01/10/2014  . SOB (shortness of breath) 03/01/2013  . Bilateral lower extremity edema   . 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 ANKLE SURGERY     Left  . ANKLE SURGERY    . ANTERIOR CERVICAL DECOMP/DISCECTOMY FUSION  11/17/2011   Procedure: ANTERIOR CERVICAL  DECOMPRESSION/DISCECTOMY FUSION 2 LEVELS;  Surgeon: Erline Levine, MD;  Location: [facility];  Service: Neurosurgery;  Laterality: N/A;  Cervical Five-Six Six-Seven Anterior cervical decompression/diskectomy/fusion  . BREAST BIOPSY Right 12-31-13   invasive mammary  . BREAST SURGERY Right 02/03/2014   Right simple mastectomy with sentinel node biopsy.  . CHOLECYSTECTOMY    . COLONOSCOPY  2014  . Lower extremity venous Dopplers  Feb 27, 2013   No LE DVT  . MASTECTOMY Right 2015  . Port a  cath insertion Right 01/19/2010  . PORT-A-CATH REMOVAL     right  . PORT-A-CATH REMOVAL Right 09/03/2013   Procedure: REMOVAL PORT-A-CATH;  Surgeon: Conrad [place], MD;  Location: [facility];  Service: Vascular;  Laterality: Right;  . TRANSTHORACIC ECHOCARDIOGRAM  03/2013; 02/2014   a) Normal LV size and function with EF 60-65%.; Cannot exclude bicuspid aortic valve with mild AS and mild AI.; b) Normal EF with normal wall motion and valve function x Mild MR. G2 DD. EF 60-65%. Tricuspid AoV  . UPPER GI ENDOSCOPY  2014    Family History  Problem Relation Age of Onset  . Cancer Father        skin  . Heart disease Father   . Heart attack Father        heart attack in his 73's  . Thyroid disease Sister   . Ovarian cancer Cousin   . Breast cancer Maternal Aunt 60  . Breast cancer Maternal Grandmother 7  . Bladder Cancer Neg Hx   . Kidney cancer Neg Hx     Social History   Socioeconomic History  . Marital status: Married    Spouse name: don  . Number of children: 0  . Years of education: 8  . Highest education level: Not on file  Occupational History  . Occupation: disability  Social Needs  . Financial resource strain: Not on file  . Food insecurity    Worry: Not on file    Inability: Not on file  . Transportation needs    Medical: Not on file    Non-medical: Not on file  Tobacco Use  . Smoking status: Never Smoker  . Smokeless tobacco: Never Used  Substance and Sexual Activity  . Alcohol use: No  . Drug use: No  . Sexual activity: Not Currently    Partners: Male  Lifestyle  . Physical activity    Days per week: Not on file    Minutes per session: Not on file  . Stress: Not on file  Relationships  . Social Herbalist on phone: Not on file    Gets together: Not on file    Attends religious service: Not on file    Active member of club or organization: Not on file    Attends meetings of clubs or organizations: Not on file    Relationship status: Not on file  .  Intimate partner violence    Fear of current or ex partner: No    Emotionally abused: No    Physically abused: No    Forced sexual activity: No  Other Topics Concern  . Not on file  Social History Narrative   She is married. Recently moved back to [place] after being in [place] for some time. She is accompanied by her husband and aunt.   Never smoked. Never used alcohol.     Current Outpatient Medications:  .  amantadine (SYMMETREL) 100 MG capsule, Take 1 capsule (100 mg total) by  mouth 3 (three) times daily., Disp: 270 capsule, Rfl: 4 .  baclofen (LIORESAL) 10 MG tablet, TAKE 1 TO 2 TABLETS BY MOUTH 3 TIMES A DAY, Disp: 540 tablet, Rfl: 2 .  gluconic acid-citric acid (RENACIDIN) irrigation, Irrigate with 60 mLs as directed 2 (two) times daily., Disp: 3600 mL, Rfl: 0 .  glucosamine-chondroitin 500-400 MG tablet, Take 1 tablet by mouth 4 (four) times daily., Disp: , Rfl:  .  ibuprofen (ADVIL) 600 MG tablet, TAKE 1 TABLET (600 MG TOTAL) BY MOUTH EVERY 8 (EIGHT) HOURS AS NEEDED., Disp: 180 tablet, Rfl: 0 .  Interferon Beta-1a (REBIF REBIDOSE) 44 MCG/0.5ML SOAJ, Inject 0.5 mLs into the skin every Monday, Wednesday, and Friday., Disp: 12 Syringe, Rfl: 11 .  levETIRAcetam (KEPPRA) 500 MG tablet, Take 1 tablet (500 mg total) by mouth 2 (two) times daily., Disp: 180 tablet, Rfl: 4 .  loratadine (CLARITIN) 10 MG tablet, Take 10 mg by mouth daily., Disp: , Rfl:  .  Misc Natural Products (LEG VEIN & CIRCULATION) TABS, Take 1 tablet by mouth as directed., Disp: , Rfl:  .  Multiple Vitamins-Minerals (HAIR SKIN AND NAILS FORMULA) TABS, Take 1 tablet by mouth daily. , Disp: , Rfl:  .  Multiple Vitamins-Minerals (MULTIVITAMIN PO), Take 1 tablet by mouth daily. , Disp: , Rfl:  .  Multiple Vitamins-Minerals (PRESERVISION AREDS 2) CAPS, Take 1 capsule by mouth 2 (two) times daily., Disp: , Rfl:  .  oxybutynin (DITROPAN-XL) 10 MG 24 hr tablet, TAKE 2 TABLETS BY MOUTH EVERY DAY, Disp: 180 tablet, Rfl:  3 .  tiZANidine (ZANAFLEX) 4 MG tablet, TAKE 1/2 TABLETS (2 MG TOTAL) BY MOUTH 2 (TWO) TIMES DAILY AS NEEDED (SPASTICITY UNCONTROLLED)., Disp: 90 tablet, Rfl: 1 .  Turmeric 500 MG CAPS, Take 500 mg by mouth daily., Disp: , Rfl:  .  Disposable Gloves (ASSURANCE VINYL EXAM GLOVES) MISC, Neurogenic bladder; MS; LON 99 months; for use for personal hygiene, Disp: 50 each, Rfl: 11 .  fluticasone (FLONASE) 50 MCG/ACT nasal spray, SPRAY 2 SPRAYS INTO EACH NOSTRIL EVERY DAY, Disp: , Rfl:   Allergies  Allergen Reactions  . Fentanyl Nausea And Vomiting and Nausea Only    vomiting Was given in PACU x3 each time patient got sick vomiting Was given in PACU x3 each time patient got sick  . Sulfa Antibiotics Hives and Other (See Comments)    Light headed, over heated    I personally reviewed active problem list, medication list, allergies, family history, social history, health maintenance, notes from last encounter, lab results with the patient/caregiver today.  Review of Systems  Constitutional: Negative.   HENT: Negative.   Eyes: Negative.   Respiratory: Negative.   Cardiovascular: Negative.   Gastrointestinal: Negative.   Endocrine: Negative.   Genitourinary: Negative.   Musculoskeletal: Negative.   Skin: Negative.   Allergic/Immunologic: Negative.   Neurological: Negative.   Hematological: Negative.   Psychiatric/Behavioral: Negative.   All other systems reviewed and are negative.     Objective:    Virtual encounter, vitals limited, only able to obtain the following: Today's Vitals   10/22/18 0808  BP: 137/68  Temp: 97.7 F (36.5 C)   There is no height or weight on file to calculate BMI. Nursing Note and Vital Signs reviewed.  Physical Exam Vitals signs and nursing note reviewed.  Pulmonary:     Effort: No respiratory distress.  Neurological:     Mental Status: She is alert.  Psychiatric:        Mood  and Affect: Mood normal.        Behavior: Behavior normal.      PE limited by telephone encounter  No results found for this or any previous visit (from the past 72 hour(s)).  PHQ2/9: Depression screen [facility] 2/9 10/22/2018 07/16/2018 05/23/2018 04/18/2018 12/26/2017  Decreased Interest 0 0 0 0 0  Down, Depressed, Hopeless 0 0 0 0 0  PHQ - 2 Score 0 0 0 0 0  Altered sleeping 0 0 - 0 0  Tired, decreased energy 0 0 - 0 0  Change in appetite 0 0 - 0 0  Feeling bad or failure about yourself  0 0 - 0 0  Trouble concentrating 0 0 - 0 0  Moving slowly or fidgety/restless 0 0 - 0 0  Suicidal thoughts 0 0 - 0 0  PHQ-9 Score 0 0 - 0 0  Difficult doing work/chores Not difficult at all Not difficult at all - Not difficult at all Not difficult at all  Some recent data might be hidden   PHQ-2/9 Result is negative.    Fall Risk: Fall Risk  10/22/2018 07/16/2018 05/23/2018 04/18/2018 02/26/2018  Falls in the past year? 0 0 0 0 0  Number falls in past yr: 0 - - 0 0  Injury with Fall? 0 - - 0 0  Risk Factor Category  - - - - -  Risk for fall due to : - - Impaired balance/gait;Impaired mobility - -  Risk for fall due to: Comment - - MS - -     Assessment and Plan:     ICD-10-CM   1. Requires assistance with activities of daily living (ADL)  Z74.1    new requirement for [facility] needs - HH orders given  2. Abnormality of gait  R26.9   3. Ambulatory dysfunction  R26.2   4. 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HCC)  G80.1   5. Seizure disorder ([place])  G40.909   6. 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 The patient was provided an opportunity to ask questions and all were answered. The patient agreed with the plan and demonstrated an understanding of the instructions.   The patient was advised to call back or seek an in-person evaluation if the symptoms worsen or if the condition fails to improve as anticipated.  I provided 20 minutes of non-face-to-face time during this encounter.   Delsa Grana, PA-C 10/22/18 5:36 PM

## 2018-10-22 NOTE — Telephone Encounter (Signed)
Verbal given 

## 2018-10-22 NOTE — Telephone Encounter (Signed)
VM given for verbal orders

## 2018-10-23 ENCOUNTER — Inpatient Hospital Stay: Payer: Medicaid Other

## 2018-10-23 ENCOUNTER — Inpatient Hospital Stay: Payer: Medicare Other

## 2018-10-23 ENCOUNTER — Ambulatory Visit: Payer: Medicare Other | Admitting: Oncology

## 2018-10-23 ENCOUNTER — Ambulatory Visit: Payer: Medicare Other | Admitting: Physician Assistant

## 2018-10-24 ENCOUNTER — Ambulatory Visit: Payer: Medicare Other | Admitting: Family Medicine

## 2018-10-24 ENCOUNTER — Other Ambulatory Visit: Payer: Self-pay

## 2018-10-25 ENCOUNTER — Ambulatory Visit: Payer: Medicare Other

## 2018-10-25 NOTE — Progress Notes (Signed)
[place]  [phone]) [phone] Fax:([phone]) [phone]  ID: Rebecca [name] OB: November 11, 1960  MR#: [id number]  KZS#:[id number]  Patient Care Team: Delsa Grana, PA-C as PCP - General (Family Medicine) Bobetta Lime, MD as Referring Physician Byrnett, Forest Gleason, MD (General Surgery) Penni Bombard, MD as Consulting Physician (Neurology) Lloyd Huger, MD as Consulting Physician (Oncology) Leonie Man, MD as Consulting Physician (Cardiology) Minna Merritts, MD as Consulting Physician (Cardiology) Laneta Simmers as Physician Assistant (Urology) Cathi Roan, [facility] (Pharmacist) Benedetto Goad, RN as Case Manager  CHIEF COMPLAINT: 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 BRCA negative.  INTERVAL HISTORY: Patient returns to clinic today for routine 49-monthevaluation.  She continues to have a chronic Foley catheter.  She has had no progression of her MS.  She currently feels well and is at her baseline.  She does not complain of pain today. Her lower extremity edema is unchanged. She has no new neurologic complaints.  She denies any chest pain, shortness of breath, cough, or hemoptysis.  She denies any nausea, vomiting, constipation, or diarrhea.  She has no urinary complaints.  Patient offers no further specific complaints today.  REVIEW OF SYSTEMS:   Review of Systems  Constitutional: Negative for chills, fever and malaise/fatigue.  Respiratory: Negative for cough and shortness of breath.   Cardiovascular: Positive for leg swelling. Negative for chest pain.  Gastrointestinal: Negative for abdominal pain, constipation, diarrhea, nausea and vomiting.  Genitourinary: Negative.  Negative for dysuria, frequency and urgency.  Musculoskeletal: Positive for back pain.  Skin: Negative.  Negative for rash.  Neurological: Positive for focal weakness. Negative for sensory change, weakness and headaches.  Psychiatric/Behavioral:  Negative.  The patient is not nervous/anxious.     As per HPI. Otherwise, a complete review of systems is negative.  PAST MEDICAL HISTORY: Past Medical History:  Diagnosis Date  . Aortic valve disease    Mild AS / AI - most recent Echo demonstrated tricuspid aortic valve.  . Bacterial endocarditis    History of .  .Marland KitchenBilateral lower extremity edema    Noncardiac.  Chronic. LE Venous dopplers - negative for DVT.; Echocardiogram January 2016: Normal EF with normal wall motion and valve function. Only grade 1 diastolic dysfunction. EF 60-65%. Mild MR  . Breast cancer ([place] 12[place]31-13   Right breast, 12:00, 1.5 cm, T1c,N0 invasive mammary carcinoma, triple negative. --> Rx with Chemo  . Cervical stenosis of spine   . Herpes zoster   . IBS (irritable bowel syndrome)   . [facility]ymphedema    [facility] [place] [facility] [place]   Walks from room to room @ home; but Wheelchair when going out.  .[place] KitchenNeuromuscular disorder ([place]    [place]  . [place]    Takes Keppra  . Syncope and collapse     PAST SURGICAL HISTORY: Past Surgical History:  Procedure Laterality Date  . ANKLE SURGERY     Left  . ANKLE SURGERY    . ANTERIOR CERVICAL DECOMP/DISCECTOMY FUSION  11/17/2011   Procedure: ANTERIOR CERVICAL DECOMPRESSION/DISCECTOMY FUSION 2 LEVELS;  Surgeon: JErline Levine MD;  [facility];  Service: Neurosurgery;  Laterality: N/A;  Cervical Five-Six Six-Seven Anterior cervical decompression/diskectomy/fusion  . BREAST BIOPSY Right 12-31-13   invasive mammary  . BREAST SURGERY Right 02/03/2014   Right simple mastectomy with sentinel node biopsy.  . CHOLECYSTECTOMY    . COLONOSCOPY  2014  . Lower extremity  venous Dopplers  Feb 27, 2013   No LE DVT  . MASTECTOMY Right 2015  . Port a cath insertion Right 01/19/2010  . PORT-A-CATH REMOVAL     right  . PORT-A-CATH REMOVAL Right 09/03/2013   Procedure: REMOVAL PORT-A-CATH;  Surgeon: Conrad Aubrey, MD;  Location: [facility];   Service: Vascular;  Laterality: Right;  . TRANSTHORACIC ECHOCARDIOGRAM  03/2013; 02/2014   a) Normal LV size and function with EF 60-65%.; Cannot exclude bicuspid aortic valve with mild AS and mild AI.; b) Normal EF with normal wall motion and valve function x Mild MR. G2 DD. EF 60-65%. Tricuspid AoV  . UPPER GI ENDOSCOPY  2014    FAMILY HISTORY Family History  Problem Relation Age of Onset  . Cancer Father        skin  . Heart disease Father   . Heart attack Father        heart attack in his 34's  . Thyroid disease Sister   . Ovarian cancer Cousin   . Breast cancer Maternal Aunt 60  . Breast cancer Maternal Grandmother 30  . Bladder Cancer Neg Hx   . Kidney cancer Neg Hx        ADVANCED DIRECTIVES:    HEALTH MAINTENANCE: Social History   Tobacco Use  . Smoking status: Never Smoker  . Smokeless tobacco: Never Used  Substance Use Topics  . Alcohol use: No  . Drug use: No     Colonoscopy:  PAP:  Bone density:  Lipid panel:  Allergies  Allergen Reactions  . Fentanyl Nausea And Vomiting and Nausea Only    vomiting Was given in PACU x3 each time patient got sick vomiting Was given in PACU x3 each time patient got sick  . Sulfa Antibiotics Hives and Other (See Comments)    Light headed, over heated    Current Outpatient Medications  Medication Sig Dispense Refill  . amantadine (SYMMETREL) 100 MG capsule Take 1 capsule (100 mg total) by mouth 3 (three) times daily. 270 capsule 4  . baclofen (LIORESAL) 10 MG tablet TAKE 1 TO 2 TABLETS BY MOUTH 3 TIMES A DAY 540 tablet 2  . Disposable Gloves (ASSURANCE VINYL EXAM GLOVES) MISC Neurogenic bladder; MS; LON 99 months; for use for personal hygiene 50 each 11  . glucosamine-chondroitin 500-400 MG tablet Take 1 tablet by mouth 4 (four) times daily.    Marland Kitchen ibuprofen (ADVIL) 600 MG tablet TAKE 1 TABLET (600 MG TOTAL) BY MOUTH EVERY 8 (EIGHT) HOURS AS NEEDED. 180 tablet 0  . Interferon Beta-1a (REBIF REBIDOSE) 44 MCG/0.5ML SOAJ  Inject 0.5 mLs into the skin every Monday, Wednesday, and Friday. 12 Syringe 11  . levETIRAcetam (KEPPRA) 500 MG tablet Take 1 tablet (500 mg total) by mouth 2 (two) times daily. 180 tablet 4  . loratadine (CLARITIN) 10 MG tablet Take 10 mg by mouth daily.    . Misc Natural Products (LEG VEIN & CIRCULATION) TABS Take 1 tablet by mouth as directed.    . Multiple Vitamins-Minerals (HAIR SKIN AND NAILS FORMULA) TABS Take 1 tablet by mouth daily.     . Multiple Vitamins-Minerals (MULTIVITAMIN PO) Take 1 tablet by mouth daily.     . Multiple Vitamins-Minerals (PRESERVISION AREDS 2) CAPS Take 1 capsule by mouth 2 (two) times daily.    Marland Kitchen oxybutynin (DITROPAN-XL) 10 MG 24 hr tablet TAKE 2 TABLETS BY MOUTH EVERY DAY 180 tablet 3  . tiZANidine (ZANAFLEX) 4 MG tablet TAKE 1/2 TABLETS (2 MG TOTAL)  BY MOUTH 2 (TWO) TIMES DAILY AS NEEDED (SPASTICITY UNCONTROLLED). 90 tablet 1  . Turmeric 500 MG CAPS Take 500 mg by mouth daily.     No current facility-administered medications for this visit.     OBJECTIVE: Vitals:   11/01/18 1421 11/01/18 1432  BP:  (!) 146/74  Pulse:  73  Resp: 20      There is no height or weight on file to calculate BMI.    ECOG FS:2 - Symptomatic, <50% confined to bed  General: Well-developed, well-nourished, no acute distress.  Sitting in a wheelchair. Eyes: Pink conjunctiva, anicteric sclera. HEENT: Normocephalic, moist mucous membranes. Lungs: Clear to auscultation bilaterally. Heart: Regular rate and rhythm. No rubs, murmurs, or gallops. Abdomen: Soft, nontender, nondistended. No organomegaly noted, normoactive bowel sounds. Musculoskeletal: Bilateral 2-3+ peripheral edema.  Foley catheter noted. Neuro: Alert, answering all questions appropriately. Cranial nerves grossly intact. Skin: No rashes or petechiae noted. Psych: Normal affect.  LAB RESULTS:  Lab Results  Component Value Date   NA 139 11/01/2018   K 3.8 11/01/2018   CL 104 11/01/2018   CO2 25 11/01/2018    GLUCOSE 116 (H) 11/01/2018   BUN 14 11/01/2018   CREATININE 0.52 11/01/2018   CALCIUM 9.1 11/01/2018   PROT 7.3 11/01/2018   ALBUMIN 3.9 11/01/2018   AST 23 11/01/2018   ALT 20 11/01/2018   ALKPHOS 92 11/01/2018   BILITOT 0.5 11/01/2018   GFRNONAA >60 11/01/2018   GFRAA >60 11/01/2018    Lab Results  Component Value Date   WBC 3.8 (L) 11/01/2018   NEUTROABS 1.8 11/01/2018   HGB 11.8 (L) 11/01/2018   HCT 35.2 (L) 11/01/2018   MCV 90.0 11/01/2018   PLT 150 11/01/2018      STUDIES: No results found.  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 BRCA negative.  PLAN:    1. Triple negative right breast cancer: No evidence of disease. Given her difficulties with Taxol and multiple sclerosis, treatment was discontinued after a total of 6 of 12 cycles of Taxol. Her last chemotherapy was July 17, 2014.  Because patient had a full mastectomy, she did not require adjuvant XRT. Given the fact that she is a triple negative cancer, she does not require an aromatase inhibitor.  Patient's most recent left screening mammogram on September 06, 2018 was reported as BI-RADS 1.  Repeat in July 2021.  CA 27-29 continues to be within normal limits, today's result is pending.  Return to clinic in 6 months for routine evaluation at which point patient can likely be transitioned to yearly visits.   2. Multiple sclerosis: Continue evaluation and current treatment per neurology. Her primary neurologist is Dr. Andrey Spearman.  phone [phone], fax [phone]. 3. Lymphedema: Chronic and unchanged.  Continue treatment and wraps as prescribed. 4. Peripheral neuropathy: Patient does not complain of this today. 5. Pain: Patient is no longer taking narcotics. 6.  Thrombocytopenia: Resolved. 7.  Foley catheter: Patient states she was previously evaluated for a suprapubic catheter, but is unclear if this procedure is going to proceed or not.  Patient expressed understanding and was  in agreement with this plan. She also understands that She can call clinic at any time with any questions, concerns, or complaints.   Breast cancer   Staging form: Breast, AJCC 7th Edition     Pathologic stage from 05/24/2014: Stage IA (T1c, N0, cM0) - Signed by Lloyd Huger, MD on 05/24/2014   Lloyd Huger, MD  11/01/2018 3:53 PM

## 2018-10-26 ENCOUNTER — Telehealth: Payer: Self-pay | Admitting: Family Medicine

## 2018-10-26 NOTE — Telephone Encounter (Signed)
Information was faxed for her services but a couple of things were left off so the state will not process paperwork.   Information that was missing is The address for [facility] and an un answered question / please cal Toney Rakes or refax paperwork

## 2018-10-29 ENCOUNTER — Encounter: Payer: Medicare Other | Attending: Physician Assistant | Admitting: Physician Assistant

## 2018-10-29 ENCOUNTER — Other Ambulatory Visit: Payer: Self-pay

## 2018-10-29 ENCOUNTER — Telehealth: Payer: Self-pay | Admitting: Family Medicine

## 2018-10-29 DIAGNOSIS — G35 Multiple sclerosis: Secondary | ICD-10-CM | POA: Diagnosis not present

## 2018-10-29 DIAGNOSIS — Z853 Personal history of malignant neoplasm of breast: Secondary | ICD-10-CM | POA: Insufficient documentation

## 2018-10-29 DIAGNOSIS — Z9221 Personal history of antineoplastic chemotherapy: Secondary | ICD-10-CM | POA: Diagnosis not present

## 2018-10-29 DIAGNOSIS — Z79899 Other long term (current) drug therapy: Secondary | ICD-10-CM | POA: Insufficient documentation

## 2018-10-29 DIAGNOSIS — M6281 Muscle weakness (generalized): Secondary | ICD-10-CM | POA: Insufficient documentation

## 2018-10-29 DIAGNOSIS — G40909 Epilepsy, unspecified, not intractable, without status epilepticus: Secondary | ICD-10-CM | POA: Diagnosis not present

## 2018-10-29 DIAGNOSIS — L89893 Pressure ulcer of other site, stage 3: Secondary | ICD-10-CM | POA: Diagnosis present

## 2018-10-29 DIAGNOSIS — L89899 Pressure ulcer of other site, unspecified stage: Secondary | ICD-10-CM | POA: Diagnosis not present

## 2018-10-29 NOTE — Telephone Encounter (Unsigned)
Copied from [place] [phone]. Topic: Quick Communication - Home Health Verbal Orders >> Oct 29, 2018 10:14 AM Ivar Drape wrote: Caller/Agency:  Kathlee Nations PT w/[facility] Number:  [phone] Patient had a call on Friday, no injuries, called EMS to get her off the floor, she didn't want to go to the hospital

## 2018-10-29 NOTE — Telephone Encounter (Unsigned)
Copied from [place] [phone]. Topic: Quick Communication - Home Health Verbal Orders >> Oct 29, 2018 10:14 AM Ivar Drape wrote: Caller/Agency:  Kathlee Nations PT w/[facility] Number:  [phone] Patient had a call on Friday, no injuries, called EMS to get her off the floor, she didn't want to go to the hospital

## 2018-10-29 NOTE — Progress Notes (Addendum)
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 Negative for: Gout; Rheumatoid Arthritis; Osteoarthritis; Osteomyelitis Neurologic Medical History: Positive for: Seizure Disorder - on kepra Negative for: Dementia; Neuropathy; Quadriplegia; Paraplegia Past Medical History Notes: Multiple Sclerosis Oncologic Medical History: Positive for: Received Chemotherapy Negative for: Received Radiation Past Medical History Notes: breast cancer Immunizations Pneumococcal Vaccine: Received Pneumococcal Vaccination: No Immunization Notes: patient does not remember Implantable Devices None KADAJA, Rebecca [name] ([id number]) Hospitalization / Surgery History Type of  Hospitalization/Surgery colonsocopy cholecytectomy right mastectomy Family and Social History Cancer: Yes - Mother,Father; Diabetes: Yes - Mother; Heart Disease: Yes - Father,Mother; Hereditary Spherocytosis: No; Hypertension: Yes - Siblings; Kidney Disease: No; Lung Disease: No; Seizures: No; Stroke: Yes - Mother,Father; Thyroid Problems: Yes - Siblings; Tuberculosis: No; Never smoker; Marital Status - Married; Alcohol Use: Never; Drug Use: No History; Caffeine Use: Daily - coffee; Financial Concerns: No; Food, Clothing or Shelter Needs: No; Support System Lacking: No; Transportation Concerns: No Physician Affirmation I have reviewed and agree with the above information. Electronic Signature(s) Signed: 10/29/2018 4:36:39 PM By: Harold Barban Signed: 10/29/2018 4:43:35 PM By: Worthy Keeler PA-C Entered By: Worthy Keeler on 10/29/2018 11:54:37 Rebecca [name] ([id number]) -------------------------------------------------------------------------------- SuperBill Details Patient Name: Rebecca [name]. Date of Service: 10/29/2018 Medical Record Number: [id number] Patient Account Number: [id number] Date of Birth/Sex: November 15, 1960 (58 y.o. 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

## 2018-10-29 NOTE — Progress Notes (Signed)
MURIEL, BABERS ([id number]) Visit Report for 10/29/2018 Arrival Information Details Patient Name: Rebecca [name], Rebecca [name]. Date of Service: 10/29/2018 11:00 AM Medical Record Number: [id number] Patient Account Number: [id number] Date of Birth/Sex: 1960-05-14 (58 y.o. F) Treating RN: Harold Barban Primary Care Shalay Carder: Delsa Grana Other Clinician: Referring Saranya Harlin: Delsa Grana Treating Fatumata Kashani/Extender: Melburn Hake, HOYT Weeks in Treatment: 3 Visit Information History Since Last Visit Added or deleted any medications: No Patient Arrived: Wheel Chair Any new allergies or adverse reactions: No Arrival Time: 11:28 Had a fall or experienced change in No Accompanied By: self activities of daily living that may affect Transfer Assistance: None risk of falls: Patient Identification Verified: Yes Signs or symptoms of abuse/neglect since last visito No Secondary Verification Process Completed: Yes Hospitalized since last visit: No Implantable device outside of the clinic excluding No cellular tissue based products placed in the center since last visit: Has Dressing in Place as Prescribed: Yes Pain Present Now: No Electronic Signature(s) Signed: 10/29/2018 4:32:55 PM By: Lorine Bears RCP, RRT, CHT Entered By: Lorine Bears on 10/29/2018 11:29:13 Rebecca [name] ([id number]) -------------------------------------------------------------------------------- Clinic Level of Care Assessment Details Patient Name: Rebecca [name]. Date of Service: 10/29/2018 11:00 AM Medical Record Number: [id number] Patient Account Number: [id number] Date of Birth/Sex: 23-Mar-1960 (58 y.o. F) Treating RN: Harold Barban Primary Care Aliannah Holstrom: Delsa Grana Other Clinician: Referring Luretha Eberly: Delsa Grana Treating Shineka Auble/Extender: Melburn Hake, HOYT Weeks in Treatment: 3 Clinic Level of Care Assessment Items TOOL 4 Quantity Score []  - Use when only an EandM is performed on FOLLOW-UP  visit 0 ASSESSMENTS - Nursing Assessment / Reassessment X - Reassessment of Co-morbidities (includes updates in patient status) 1 10 X- 1 5 Reassessment of Adherence to Treatment Plan ASSESSMENTS - Wound and Skin Assessment / Reassessment X - Simple Wound Assessment / Reassessment - one wound 1 5 []  - 0 Complex Wound Assessment / Reassessment - multiple wounds []  - 0 Dermatologic / Skin Assessment (not related to wound area) ASSESSMENTS - Focused Assessment []  - Circumferential Edema Measurements - multi extremities 0 []  - 0 Nutritional Assessment / Counseling / Intervention []  - 0 Lower Extremity Assessment (monofilament, tuning fork, pulses) []  - 0 Peripheral Arterial Disease Assessment (using hand held doppler) ASSESSMENTS - Ostomy and/or Continence Assessment and Care []  - Incontinence Assessment and Management 0 []  - 0 Ostomy Care Assessment and Management (repouching, etc.) PROCESS - Coordination of Care X - Simple Patient / Family Education for ongoing care 1 15 []  - 0 Complex (extensive) Patient / Family Education for ongoing care []  - 0 Staff obtains Programmer, systems, Records, Test Results / Process Orders X- 1 10 Staff telephones HHA, Nursing Homes / Clarify orders / etc []  - 0 Routine Transfer to another Facility (non-emergent condition) []  - 0 Routine Hospital Admission (non-emergent condition) []  - 0 New Admissions / Biomedical engineer / Ordering NPWT, Apligraf, etc. []  - 0 Emergency Hospital Admission (emergent condition) X- 1 10 Simple Discharge Coordination Rebecca [name], Rebecca [name]. ([id number]) []  - 0 Complex (extensive) Discharge Coordination PROCESS - Special Needs []  - Pediatric / Minor Patient Management 0 []  - 0 Isolation Patient Management []  - 0 Hearing / Language / Visual special needs []  - 0 Assessment of Community assistance (transportation, D/C planning, etc.) []  - 0 Additional assistance / Altered mentation []  - 0 Support Surface(s) Assessment  (bed, cushion, seat, etc.) INTERVENTIONS - Wound Cleansing / Measurement X - Simple Wound Cleansing - one wound 1 5 []  - 0 Complex Wound Cleansing - multiple  wounds X- 1 5 Wound Imaging (photographs - any number of wounds) []  - 0 Wound Tracing (instead of photographs) X- 1 5 Simple Wound Measurement - one wound []  - 0 Complex Wound Measurement - multiple wounds INTERVENTIONS - Wound Dressings X - Small Wound Dressing one or multiple wounds 1 10 []  - 0 Medium Wound Dressing one or multiple wounds []  - 0 Large Wound Dressing one or multiple wounds []  - 0 Application of Medications - topical []  - 0 Application of Medications - injection INTERVENTIONS - Miscellaneous []  - External ear exam 0 []  - 0 Specimen Collection (cultures, biopsies, blood, body fluids, etc.) []  - 0 Specimen(s) / Culture(s) sent or taken to Lab for analysis []  - 0 Patient Transfer (multiple staff / Civil Service fast streamer / Similar devices) []  - 0 Simple Staple / Suture removal (25 or less) []  - 0 Complex Staple / Suture removal (26 or more) []  - 0 Hypo / Hyperglycemic Management (close monitor of Blood Glucose) []  - 0 Ankle / Brachial Index (ABI) - do not check if billed separately X- 1 5 Vital Signs Dalpe, Jenniferann M. ([id number]) Has the patient been seen at the hospital within the last three years: Yes Total Score: 85 Level Of Care: New/Established - Level 3 Electronic Signature(s) Signed: 10/29/2018 4:36:39 PM By: Harold Barban Entered By: Harold Barban on 10/29/2018 11:48:43 Rebecca [name] ([id number]) -------------------------------------------------------------------------------- Encounter Discharge Information Details Patient Name: Rebecca [name]. Date of Service: 10/29/2018 11:00 AM Medical Record Number: [id number] Patient Account Number: [id number] Date of Birth/Sex: 1960/08/15 (58 y.o. F) Treating RN: Harold Barban Primary Care Nimra Puccinelli: Delsa Grana Other Clinician: Referring Resean Brander:  Delsa Grana Treating Brison Fiumara/Extender: Melburn Hake, HOYT Weeks in Treatment: 3 Encounter Discharge Information Items Discharge Condition: Stable Ambulatory Status: Wheelchair Discharge Destination: Home Transportation: Private Auto Accompanied By: husband Schedule Follow-up Appointment: Yes Clinical Summary of Care: Electronic Signature(s) Signed: 10/29/2018 4:36:39 PM By: Harold Barban Entered By: Harold Barban on 10/29/2018 11:57:08 Rebecca [name] ([id number]) -------------------------------------------------------------------------------- Lower Extremity Assessment Details Patient Name: Rebecca [name]. Date of Service: 10/29/2018 11:00 AM Medical Record Number: [id number] Patient Account Number: [id number] Date of Birth/Sex: 30-Jun-1960 (59 y.o. F) Treating RN: Army Melia Primary Care Jaysten Essner: Delsa Grana Other Clinician: Referring Toshua Honsinger: Delsa Grana Treating Allyana Vogan/Extender: Melburn Hake, HOYT Weeks in Treatment: 3 Electronic Signature(s) Signed: 10/29/2018 11:41:17 AM By: Army Melia Entered By: Army Melia on 10/29/2018 11:39:36 Rebecca [name] ([id number]) -------------------------------------------------------------------------------- Multi Wound Chart Details Patient Name: Rebecca [name]. Date of Service: 10/29/2018 11:00 AM Medical Record Number: [id number] Patient Account Number: [id number] Date of Birth/Sex: [birth year] (58 y.o. F) Treating RN: Harold Barban Primary Care Alexsis Kathman: Delsa Grana Other Clinician: Referring Domanique Huesman: Delsa Grana Treating Trinette Vera/Extender: Melburn Hake, HOYT Weeks in Treatment: 3 Vital Signs Height(in): 64 Pulse(bpm): 75 Weight(lbs): 250 Blood Pressure(mmHg): 137/66 Body Mass Index(BMI): 43 Temperature(F): 98.3 Respiratory Rate 16 (breaths/min): Photos: [N/A:N/A] Wound Location: Left Upper Leg - Posterior N/A N/A Wounding Event: Pressure Injury N/A N/A Primary Etiology: Pressure Ulcer N/A N/A Comorbid History:  Arrhythmia, Seizure Disorder, N/A N/A Received Chemotherapy Date Acquired: 09/25/2018 N/A N/A Weeks of Treatment: 3 N/A N/A Wound Status: Open N/A N/A Measurements L x W x D 0.4x1x0.2 N/A N/A (cm) Area (cm) : 0.314 N/A N/A Volume (cm) : 0.063 N/A N/A % Reduction in Area: 75.00% N/A N/A % Reduction in Volume: 74.90% N/A N/A Classification: Category/Stage III N/A N/A Exudate Amount: Medium N/A N/A Exudate Type: Serosanguineous N/A N/A Exudate Color: red, brown N/A N/A Wound Margin: Flat  and Intact N/A N/A Granulation Amount: Large (67-100%) N/A N/A Granulation Quality: Red N/A N/A Necrotic Amount: None Present (0%) N/A N/A Exposed Structures: Fat Layer (Subcutaneous N/A N/A Tissue) Exposed: Yes Fascia: No Tendon: No Muscle: No Joint: No Bone: No Rebecca [name], Rebecca [name] ([id number]) Epithelialization: None N/A N/A Treatment Notes Electronic Signature(s) Signed: 10/29/2018 4:36:39 PM By: Harold Barban Entered By: Harold Barban on 10/29/2018 11:47:24 Rebecca [name] ([id number]) -------------------------------------------------------------------------------- [place] Details Patient Name: Rebecca [name]. Date of Service: 10/29/2018 11:00 AM Medical Record Number: [id number] Patient Account Number: [id number] Date of Birth/Sex: 28-Apr-1960 (58 y.o. F) Treating RN: Harold Barban Primary Care Chelci [place]: Delsa Grana Other Clinician: Referring Alyus Mofield: Delsa Grana Treating Frayda Egley/Extender: Melburn Hake, HOYT Weeks in Treatment: 3 Active Inactive Abuse / Safety / Falls / Self Care Management Nursing Diagnoses: Potential for falls Goals: Patient will remain injury free related to falls Date Initiated: 10/02/2018 Target Resolution Date: 12/15/2018 Goal Status: Active Interventions: Assess fall risk on admission and as needed Notes: Orientation to the Wound Care Program Nursing Diagnoses: Knowledge deficit related to the wound healing center  program Goals: Patient/caregiver will verbalize understanding of the [facility] Program Date Initiated: 10/02/2018 Target Resolution Date: 12/15/2018 Goal Status: Active Interventions: Provide education on orientation to the wound center Notes: Pressure Nursing Diagnoses: Knowledge deficit related to causes and risk factors for pressure ulcer development Goals: Patient will remain free from development of additional pressure ulcers Date Initiated: 10/02/2018 Target Resolution Date: 12/15/2018 Goal Status: Active Interventions: Provide education on pressure ulcers Rebecca [name], Rebecca [name]. ([id number]) Notes: Wound/Skin Impairment Nursing Diagnoses: Impaired tissue integrity Goals: Ulcer/skin breakdown will heal within 14 weeks Date Initiated: 10/02/2018 Target Resolution Date: 12/15/2018 Goal Status: Active Interventions: Assess patient/caregiver ability to obtain necessary supplies Assess patient/caregiver ability to perform ulcer/skin care regimen upon admission and as needed Assess ulceration(s) every visit Notes: Electronic Signature(s) Signed: 10/29/2018 4:36:39 PM By: Harold Barban Entered By: Harold Barban on 10/29/2018 11:47:11 Rebecca [name] ([id number]) -------------------------------------------------------------------------------- Pain Assessment Details Patient Name: Rebecca [name]. Date of Service: 10/29/2018 11:00 AM Medical Record Number: [id number] Patient Account Number: [id number] Date of Birth/Sex: [birth year] (58 y.o. F) Treating RN: Harold Barban Primary Care Raquan Iannone: Delsa Grana Other Clinician: Referring Ashelyn Mccravy: Delsa Grana Treating Princes Finger/Extender: Melburn Hake, HOYT Weeks in Treatment: 3 Active Problems Location of Pain Severity and Description of Pain Patient Has Paino No Site Locations Pain Management and Medication Current Pain Management: Electronic Signature(s) Signed: 10/29/2018 4:32:55 PM By: Lorine Bears RCP,  RRT, CHT Signed: 10/29/2018 4:36:39 PM By: Harold Barban Entered By: Lorine Bears on 10/29/2018 11:29:22 Rebecca [name] ([id number]) -------------------------------------------------------------------------------- Patient/Caregiver Education Details Patient Name: Rebecca [name], Rebecca [name]. Date of Service: 10/29/2018 11:00 AM Medical Record Number: [id number] Patient Account Number: [id number] Date of Birth/Gender: Mar 15, 1960 (58 y.o. F) Treating RN: Harold Barban Primary Care Physician: Delsa Grana Other Clinician: Referring Physician: Delsa Grana Treating Physician/Extender: Sharalyn Ink in Treatment: 3 Education Assessment Education Provided To: Patient Education Topics Provided Pressure: Handouts: Pressure Ulcers: Care and Offloading Methods: Demonstration, Explain/Verbal Responses: State content correctly Wound/Skin Impairment: Handouts: Caring for Your Ulcer Methods: Demonstration, Explain/Verbal Responses: State content correctly Electronic Signature(s) Signed: 10/29/2018 4:36:39 PM By: Harold Barban Entered By: Harold Barban on 10/29/2018 11:47:48 Rebecca [name] ([id number]) -------------------------------------------------------------------------------- Wound Assessment Details Patient Name: Rebecca [name]. Date of Service: 10/29/2018 11:00 AM Medical Record Number: [id number] Patient Account Number: [id number] Date of Birth/Sex: [birth year] (58 y.o. F) Treating RN: Harold Barban Primary Care Johnnell Liou: Delsa Grana Other  Clinician: Referring Thedora Rings: Delsa Grana Treating Rayanna Matusik/Extender: Melburn Hake, HOYT Weeks in Treatment: 3 Wound Status Wound Number: 2 Primary Pressure Ulcer Etiology: Wound Location: Left Upper Leg - Posterior Wound Status: Open Wounding Event: Pressure Injury Comorbid Arrhythmia, Seizure Disorder, Received Date Acquired: 09/25/2018 History: Chemotherapy Weeks Of Treatment: 3 Clustered Wound: No Photos Wound  Measurements Length: (cm) 1 % Reduction Width: (cm) 0.4 % Reduction Depth: (cm) 0.2 Epithelializ Area: (cm) 0.314 Tunneling: Volume: (cm) 0.063 Undermining in Area: 75% in Volume: 74.9% ation: None No : No Wound Description Classification: Category/Stage III Foul Odor A Wound Margin: Flat and Intact Slough/Fibr Exudate Amount: Medium Exudate Type: Serosanguineous Exudate Color: red, brown fter Cleansing: No ino No Wound Bed Granulation Amount: Large (67-100%) Exposed Structure Granulation Quality: Red Fascia Exposed: No Necrotic Amount: None Present (0%) Fat Layer (Subcutaneous Tissue) Exposed: Yes Tendon Exposed: No Muscle Exposed: No Joint Exposed: No Bone Exposed: No Treatment Notes Rebecca [name], Rebecca [name]. ([id number]) Wound #2 (Left, Posterior Upper Leg) Notes hydrafera bue, BFD Electronic Signature(s) Signed: 10/29/2018 11:54:16 AM By: Worthy Keeler PA-C Signed: 10/29/2018 4:36:39 PM By: Harold Barban Previous Signature: 10/29/2018 11:41:17 AM Version By: Army Melia Entered By: Worthy Keeler on 10/29/2018 11:54:15 Rebecca [name] ([id number]) -------------------------------------------------------------------------------- Vitals Details Patient Name: Rebecca [name]. Date of Service: 10/29/2018 11:00 AM Medical Record Number: [id number] Patient Account Number: [id number] Date of Birth/Sex: 09-Feb-1960 (58 y.o. F) Treating RN: Harold Barban Primary Care Abisai Coble: Delsa Grana Other Clinician: Referring Tregan Read: Delsa Grana Treating Wania Longstreth/Extender: Melburn Hake, HOYT Weeks in Treatment: 3 Vital Signs Time Taken: 11:29 Temperature (F): 98.3 Height (in): 64 Pulse (bpm): 75 Weight (lbs): 250 Respiratory Rate (breaths/min): 16 Body Mass Index (BMI): 42.9 Blood Pressure (mmHg): 137/66 Reference Range: 80 - 120 mg / dl Electronic Signature(s) Signed: 10/29/2018 4:32:55 PM By: Lorine Bears RCP, RRT, CHT Entered By: Lorine Bears on 10/29/2018 11:30:38

## 2018-10-30 NOTE — Telephone Encounter (Signed)
re-faxed

## 2018-10-31 ENCOUNTER — Encounter: Payer: Self-pay | Admitting: Oncology

## 2018-10-31 NOTE — Progress Notes (Signed)
Called patient today for tomorrows follow up visit.  Patient denies any nausea, vomiting, diarrhea or constipation.  No new breast concerns

## 2018-11-01 ENCOUNTER — Inpatient Hospital Stay: Payer: Medicare Other

## 2018-11-01 ENCOUNTER — Other Ambulatory Visit: Payer: Self-pay

## 2018-11-01 ENCOUNTER — Inpatient Hospital Stay: Payer: Medicare Other | Attending: Oncology | Admitting: Oncology

## 2018-11-01 VITALS — BP 146/74 | HR 73 | Resp 20

## 2018-11-01 DIAGNOSIS — Z803 Family history of malignant neoplasm of breast: Secondary | ICD-10-CM | POA: Insufficient documentation

## 2018-11-01 DIAGNOSIS — Z8249 Family history of ischemic heart disease and other diseases of the circulatory system: Secondary | ICD-10-CM | POA: Insufficient documentation

## 2018-11-01 DIAGNOSIS — G629 Polyneuropathy, unspecified: Secondary | ICD-10-CM | POA: Insufficient documentation

## 2018-11-01 DIAGNOSIS — G35 Multiple sclerosis: Secondary | ICD-10-CM | POA: Diagnosis not present

## 2018-11-01 DIAGNOSIS — C50911 Malignant neoplasm of unspecified site of right female breast: Secondary | ICD-10-CM | POA: Diagnosis not present

## 2018-11-01 DIAGNOSIS — R600 Localized edema: Secondary | ICD-10-CM | POA: Diagnosis not present

## 2018-11-01 DIAGNOSIS — I890 Lymphedema, not elsewhere classified: Secondary | ICD-10-CM | POA: Diagnosis not present

## 2018-11-01 DIAGNOSIS — Z95828 Presence of other vascular implants and grafts: Secondary | ICD-10-CM

## 2018-11-01 DIAGNOSIS — Z791 Long term (current) use of non-steroidal anti-inflammatories (NSAID): Secondary | ICD-10-CM | POA: Diagnosis not present

## 2018-11-01 DIAGNOSIS — Z853 Personal history of malignant neoplasm of breast: Secondary | ICD-10-CM | POA: Insufficient documentation

## 2018-11-01 DIAGNOSIS — Z79899 Other long term (current) drug therapy: Secondary | ICD-10-CM | POA: Diagnosis not present

## 2018-11-01 DIAGNOSIS — Z9221 Personal history of antineoplastic chemotherapy: Secondary | ICD-10-CM | POA: Insufficient documentation

## 2018-11-01 LAB — CBC WITH DIFFERENTIAL/PLATELET
Abs Immature Granulocytes: 0.02 10*3/uL (ref 0.00–0.07)
Basophils Absolute: 0 10*3/uL (ref 0.0–0.1)
Basophils Relative: 1 %
Eosinophils Absolute: 0.4 10*3/uL (ref 0.0–0.5)
Eosinophils Relative: 11 %
HCT: 35.2 % — ABNORMAL LOW (ref 36.0–46.0)
Hemoglobin: 11.8 g/dL — ABNORMAL LOW (ref 12.0–15.0)
Immature Granulocytes: 1 %
Lymphocytes Relative: 33 %
Lymphs Abs: 1.3 10*3/uL (ref 0.7–4.0)
MCH: 30.2 pg (ref 26.0–34.0)
MCHC: 33.5 g/dL (ref 30.0–36.0)
MCV: 90 fL (ref 80.0–100.0)
Monocytes Absolute: 0.3 10*3/uL (ref 0.1–1.0)
Monocytes Relative: 9 %
Neutro Abs: 1.8 10*3/uL (ref 1.7–7.7)
Neutrophils Relative %: 45 %
Platelets: 150 10*3/uL (ref 150–400)
RBC: 3.91 MIL/uL (ref 3.87–5.11)
RDW: 12.5 % (ref 11.5–15.5)
WBC: 3.8 10*3/uL — ABNORMAL LOW (ref 4.0–10.5)
nRBC: 0 % (ref 0.0–0.2)

## 2018-11-01 LAB — COMPREHENSIVE METABOLIC PANEL
ALT: 20 U/L (ref 0–44)
AST: 23 U/L (ref 15–41)
Albumin: 3.9 g/dL (ref 3.5–5.0)
Alkaline Phosphatase: 92 U/L (ref 38–126)
Anion gap: 10 (ref 5–15)
BUN: 14 mg/dL (ref 6–20)
CO2: 25 mmol/L (ref 22–32)
Calcium: 9.1 mg/dL (ref 8.9–10.3)
Chloride: 104 mmol/L (ref 98–111)
Creatinine, Ser: 0.52 mg/dL (ref 0.44–1.00)
GFR calc Af Amer: >60 mL/min (ref >60)
GFR calc non Af Amer: >60 mL/min (ref >60)
Glucose, Bld: 116 mg/dL — ABNORMAL HIGH (ref 70–99)
Potassium: 3.8 mmol/L (ref 3.5–5.1)
Sodium: 139 mmol/L (ref 135–145)
Total Bilirubin: 0.5 mg/dL (ref 0.3–1.2)
Total Protein: 7.3 g/dL (ref 6.5–8.1)

## 2018-11-01 MED ORDER — SODIUM CHLORIDE 0.9% FLUSH
10.0000 mL | Freq: Once | INTRAVENOUS | Status: AC
  Administered 2018-11-01: 14:00:00 10 mL via INTRAVENOUS
  Filled 2018-11-01: qty 10

## 2018-11-01 MED ORDER — HEPARIN SOD (PORK) LOCK FLUSH 100 UNIT/ML IV SOLN
500.0000 [IU] | Freq: Once | INTRAVENOUS | Status: AC
  Administered 2018-11-01: 500 [IU] via INTRAVENOUS

## 2018-11-02 ENCOUNTER — Ambulatory Visit: Payer: Medicare Other

## 2018-11-02 LAB — CANCER ANTIGEN 27.29: CA 27.29: 15.9 U/mL (ref 0.0–38.6)

## 2018-11-05 ENCOUNTER — Other Ambulatory Visit: Payer: Self-pay

## 2018-11-05 ENCOUNTER — Ambulatory Visit (INDEPENDENT_AMBULATORY_CARE_PROVIDER_SITE_OTHER): Payer: Medicare Other

## 2018-11-05 DIAGNOSIS — R338 Other retention of urine: Secondary | ICD-10-CM | POA: Diagnosis not present

## 2018-11-05 DIAGNOSIS — Z466 Encounter for fitting and adjustment of urinary device: Secondary | ICD-10-CM

## 2018-11-05 MED ORDER — RENACIDIN IR SOLN: 30.0000 mL | Freq: Three times a day (TID) | Status: DC

## 2018-11-05 NOTE — Progress Notes (Signed)
Simple Catheter Placement  Due to urinary retention patient is present today for a foley cath placement.  Patient was cleaned and prepped in a sterile fashion with betadine.  A 16 FR foley catheter was inserted, urine return was noted  8ml, urine was yellow in color.  The balloon was filled with 10cc of sterile water.  A leg  bag was attached for drainage. Patient was also given a night bag to take home and was given instruction on how to change from one bag to another.  Patient was given instruction on proper catheter care.  Patient tolerated well, no complications were noted   Performed by: Gordy Clement, CMA  Additional notes/ Follow up: in one month.

## 2018-11-06 ENCOUNTER — Other Ambulatory Visit: Payer: Self-pay | Admitting: Diagnostic Neuroimaging

## 2018-11-06 ENCOUNTER — Other Ambulatory Visit: Payer: Self-pay | Admitting: Physician Assistant

## 2018-11-06 ENCOUNTER — Other Ambulatory Visit: Payer: Self-pay | Admitting: Family Medicine

## 2018-11-06 DIAGNOSIS — R338 Other retention of urine: Secondary | ICD-10-CM

## 2018-11-06 MED ORDER — RENACIDIN IR SOLN: 30.0000 mL | Freq: Three times a day (TID) | 0 refills | Status: DC

## 2018-11-06 NOTE — Telephone Encounter (Signed)
Pt LMOM asking about an rx that was supposed to be called in yesterday. She states that it is not at her pharmacy.

## 2018-11-06 NOTE — Telephone Encounter (Signed)
Requested medication (s) are due for refill today: yes  Requested medication (s) are on the active medication list: yes  Last refill:  07/14/2018  Future visit scheduled: no  Notes to clinic:  Review for refill   Requested Prescriptions  Pending Prescriptions Disp Refills   ibuprofen (ADVIL) 600 MG tablet [Pharmacy Med Name: IBUPROFEN 600 MG TABLET] 90 tablet 3    Sig: TAKE 1 TABLET (600 MG TOTAL) BY MOUTH EVERY 8 (EIGHT) HOURS AS NEEDED.     Analgesics:  NSAIDS Failed - 11/06/2018  9:19 AM      Failed - HGB in normal range and within 360 days    Hemoglobin  Date Value Ref Range Status  11/01/2018 11.8 (L) 12.0 - 15.0 g/dL Final  06/27/2016 12.4 11.1 - 15.9 g/dL Final         Passed - Cr in normal range and within 360 days    Creat  Date Value Ref Range Status  11/25/2015 0.49 (L) 0.50 - 1.05 mg/dL Final    Comment:      For patients > or = 58 years of age: The upper reference limit for Creatinine is approximately 13% higher for people identified as African-American.      Creatinine, Ser  Date Value Ref Range Status  11/01/2018 0.52 0.44 - 1.00 mg/dL Final         Passed - Patient is not pregnant      Passed - Valid encounter within last 12 months    Recent Outpatient Visits          2 weeks ago Requires assistance with activities of daily living (ADL)   [facility] Delsa Grana, PA-C   3 months ago Foul smelling urine   [place], NP   6 months ago Partial symptomatic epilepsy with complex partial seizures, not intractable, without status epilepticus [facility])   [facility] Lada, Satira Anis, MD   8 months ago [place], NP   10 months ago Urinary tract infection, recurrent   [facility] Lada, Satira Anis, MD

## 2018-11-12 ENCOUNTER — Ambulatory Visit: Payer: Medicare Other | Admitting: Physical Medicine & Rehabilitation

## 2018-11-13 ENCOUNTER — Ambulatory Visit: Payer: Medicare Other | Admitting: Physician Assistant

## 2018-11-16 ENCOUNTER — Encounter: Payer: Medicare Other | Attending: Physician Assistant | Admitting: Physician Assistant

## 2018-11-16 ENCOUNTER — Other Ambulatory Visit: Payer: Self-pay

## 2018-11-16 DIAGNOSIS — Z9049 Acquired absence of other specified parts of digestive tract: Secondary | ICD-10-CM | POA: Insufficient documentation

## 2018-11-16 DIAGNOSIS — G35 Multiple sclerosis: Secondary | ICD-10-CM | POA: Diagnosis not present

## 2018-11-16 DIAGNOSIS — Z823 Family history of stroke: Secondary | ICD-10-CM | POA: Insufficient documentation

## 2018-11-16 DIAGNOSIS — E669 Obesity, unspecified: Secondary | ICD-10-CM | POA: Diagnosis not present

## 2018-11-16 DIAGNOSIS — K589 Irritable bowel syndrome without diarrhea: Secondary | ICD-10-CM | POA: Diagnosis not present

## 2018-11-16 DIAGNOSIS — G40909 Epilepsy, unspecified, not intractable, without status epilepticus: Secondary | ICD-10-CM | POA: Diagnosis not present

## 2018-11-16 DIAGNOSIS — Z6841 Body Mass Index (BMI) 40.0 and over, adult: Secondary | ICD-10-CM | POA: Diagnosis not present

## 2018-11-16 DIAGNOSIS — Z853 Personal history of malignant neoplasm of breast: Secondary | ICD-10-CM | POA: Diagnosis not present

## 2018-11-16 DIAGNOSIS — I890 Lymphedema, not elsewhere classified: Secondary | ICD-10-CM | POA: Insufficient documentation

## 2018-11-16 DIAGNOSIS — Z809 Family history of malignant neoplasm, unspecified: Secondary | ICD-10-CM | POA: Diagnosis not present

## 2018-11-16 DIAGNOSIS — L89893 Pressure ulcer of other site, stage 3: Secondary | ICD-10-CM | POA: Insufficient documentation

## 2018-11-16 DIAGNOSIS — Z8249 Family history of ischemic heart disease and other diseases of the circulatory system: Secondary | ICD-10-CM | POA: Diagnosis not present

## 2018-11-16 NOTE — Progress Notes (Signed)
Rebecca [name], Rebecca [name] ([id number]) Visit Report for 11/16/2018 Arrival Information Details Patient Name: Rebecca [name], Rebecca [name]. Date of Service: 11/16/2018 1:30 PM Medical Record Number: [id number] Patient Account Number: [id number] Date of Birth/Sex: 26-May-1960 (58 y.o. F) Treating RN: Cornell Barman Primary Care Blessing Ozga: Delsa Grana Other Clinician: Referring Marce Charlesworth: Delsa Grana Treating Taronda Comacho/Extender: Melburn Hake, HOYT Weeks in Treatment: 6 Visit Information History Since Last Visit Added or deleted any medications: No Patient Arrived: Wheel Chair Any new allergies or adverse reactions: No Arrival Time: 13:43 Had a fall or experienced change in No Accompanied By: husband activities of daily living that may affect Transfer Assistance: Civil Service fast streamer risk of falls: Patient Identification Verified: No Signs or symptoms of abuse/neglect since last visito No Secondary Verification Process Completed: No Hospitalized since last visit: No Implantable device outside of the clinic excluding No cellular tissue based products placed in the center since last visit: Has Dressing in Place as Prescribed: Yes Pain Present Now: No Electronic Signature(s) Signed: 11/16/2018 4:44:03 PM By: Gretta Cool, BSN, RN, CWS, Kim RN, BSN Entered By: Gretta Cool, BSN, RN, CWS, Kim on 11/16/2018 13:43:35 Rebecca [name] ([id number]) -------------------------------------------------------------------------------- Clinic Level of Care Assessment Details Patient Name: Rebecca [name], Rebecca [name]. Date of Service: 11/16/2018 1:30 PM Medical Record Number: [id number] Patient Account Number: [id number] Date of Birth/Sex: September 09, 1960 (58 y.o. F) Treating RN: Montey Hora Primary Care Detavious Rinn: Delsa Grana Other Clinician: Referring Nkenge Sonntag: Delsa Grana Treating Keenen Roessner/Extender: Melburn Hake, HOYT Weeks in Treatment: 6 Clinic Level of Care Assessment Items TOOL 4 Quantity Score []  - Use when only an EandM is performed on FOLLOW-UP visit  0 ASSESSMENTS - Nursing Assessment / Reassessment X - Reassessment of Co-morbidities (includes updates in patient status) 1 10 X- 1 5 Reassessment of Adherence to Treatment Plan ASSESSMENTS - Wound and Skin Assessment / Reassessment X - Simple Wound Assessment / Reassessment - one wound 1 5 []  - 0 Complex Wound Assessment / Reassessment - multiple wounds X- 1 10 Dermatologic / Skin Assessment (not related to wound area) ASSESSMENTS - Focused Assessment []  - Circumferential Edema Measurements - multi extremities 0 []  - 0 Nutritional Assessment / Counseling / Intervention []  - 0 Lower Extremity Assessment (monofilament, tuning fork, pulses) []  - 0 Peripheral Arterial Disease Assessment (using hand held doppler) ASSESSMENTS - Ostomy and/or Continence Assessment and Care []  - Incontinence Assessment and Management 0 []  - 0 Ostomy Care Assessment and Management (repouching, etc.) PROCESS - Coordination of Care X - Simple Patient / Family Education for ongoing care 1 15 []  - 0 Complex (extensive) Patient / Family Education for ongoing care X- 1 10 Staff obtains Programmer, systems, Records, Test Results / Process Orders []  - 0 Staff telephones HHA, Nursing Homes / Clarify orders / etc []  - 0 Routine Transfer to another Facility (non-emergent condition) []  - 0 Routine Hospital Admission (non-emergent condition) []  - 0 New Admissions / Biomedical engineer / Ordering NPWT, Apligraf, etc. []  - 0 Emergency Hospital Admission (emergent condition) X- 1 10 Simple Discharge Coordination Rebecca [name], Rebecca [name]. ([id number]) []  - 0 Complex (extensive) Discharge Coordination PROCESS - Special Needs []  - Pediatric / Minor Patient Management 0 []  - 0 Isolation Patient Management []  - 0 Hearing / Language / Visual special needs []  - 0 Assessment of Community assistance (transportation, D/C planning, etc.) []  - 0 Additional assistance / Altered mentation []  - 0 Support Surface(s) Assessment (bed,  cushion, seat, etc.) INTERVENTIONS - Wound Cleansing / Measurement X - Simple Wound Cleansing - one wound 1 5 []  - 0 Complex  Wound Cleansing - multiple wounds X- 1 5 Wound Imaging (photographs - any number of wounds) []  - 0 Wound Tracing (instead of photographs) X- 1 5 Simple Wound Measurement - one wound []  - 0 Complex Wound Measurement - multiple wounds INTERVENTIONS - Wound Dressings []  - Small Wound Dressing one or multiple wounds 0 []  - 0 Medium Wound Dressing one or multiple wounds []  - 0 Large Wound Dressing one or multiple wounds []  - 0 Application of Medications - topical []  - 0 Application of Medications - injection INTERVENTIONS - Miscellaneous []  - External ear exam 0 []  - 0 Specimen Collection (cultures, biopsies, blood, body fluids, etc.) []  - 0 Specimen(s) / Culture(s) sent or taken to Lab for analysis []  - 0 Patient Transfer (multiple staff / Civil Service fast streamer / Similar devices) []  - 0 Simple Staple / Suture removal (25 or less) []  - 0 Complex Staple / Suture removal (26 or more) []  - 0 Hypo / Hyperglycemic Management (close monitor of Blood Glucose) []  - 0 Ankle / Brachial Index (ABI) - do not check if billed separately X- 1 5 Vital Signs Rebecca [name], Rebecca M. ([id number]) Has the patient been seen at the hospital within the last three years: Yes Total Score: 85 Level Of Care: New/Established - Level 3 Electronic Signature(s) Signed: 11/16/2018 4:36:34 PM By: Montey Hora Entered By: Montey Hora on 11/16/2018 14:08:17 Rebecca [name] ([id number]) -------------------------------------------------------------------------------- Encounter Discharge Information Details Patient Name: Rebecca [name]. Date of Service: 11/16/2018 1:30 PM Medical Record Number: [id number] Patient Account Number: [id number] Date of Birth/Sex: 1960-12-09 (58 y.o. F) Treating RN: Montey Hora Primary Care Ritchie Klee: Delsa Grana Other Clinician: Referring Winslow Verrill: Delsa Grana Treating Numair Masden/Extender: Melburn Hake, HOYT Weeks in Treatment: 6 Encounter Discharge Information Items Discharge Condition: Stable Ambulatory Status: Wheelchair Discharge Destination: Home Transportation: Private Auto Accompanied By: spouse Schedule Follow-up Appointment: No Clinical Summary of Care: Electronic Signature(s) Signed: 11/16/2018 4:36:34 PM By: Montey Hora Entered By: Montey Hora on 11/16/2018 14:09:11 Rebecca [name] ([id number]) -------------------------------------------------------------------------------- Lower Extremity Assessment Details Patient Name: Rebecca [name]. Date of Service: 11/16/2018 1:30 PM Medical Record Number: [id number] Patient Account Number: [id number] Date of Birth/Sex: September 21, 1960 (58 y.o. F) Treating RN: Cornell Barman Primary Care Contrina Orona: Delsa Grana Other Clinician: Referring Dawson Hollman: Delsa Grana Treating Reatha Sur/Extender: Worthy Keeler Weeks in Treatment: 6 Electronic Signature(s) Signed: 11/16/2018 4:44:03 PM By: Gretta Cool, BSN, RN, CWS, Kim RN, BSN Entered By: Gretta Cool, BSN, RN, CWS, Kim on 11/16/2018 13:53:55 Rebecca [name] ([id number]) -------------------------------------------------------------------------------- Multi Wound Chart Details Patient Name: Rebecca [name]. Date of Service: 11/16/2018 1:30 PM Medical Record Number: [id number] Patient Account Number: [id number] Date of Birth/Sex: 1960-09-13 (58 y.o. F) Treating RN: Montey Hora Primary Care Danitra Payano: Delsa Grana Other Clinician: Referring Zamiah Tollett: Delsa Grana Treating Jennetta Flood/Extender: Melburn Hake, HOYT Weeks in Treatment: 6 Vital Signs Height(in): 64 Pulse(bpm): 67 Weight(lbs): 250 Blood Pressure(mmHg): 143/64 Body Mass Index(BMI): 43 Temperature(F): 98.2 Respiratory Rate 16 (breaths/min): Photos: [N/A:N/A] Wound Location: Left, Posterior Upper Leg N/A N/A Wounding Event: Pressure Injury N/A N/A Primary Etiology: Pressure Ulcer N/A N/A Comorbid  History: Arrhythmia, Seizure Disorder, N/A N/A Received Chemotherapy Date Acquired: 09/25/2018 N/A N/A Weeks of Treatment: 6 N/A N/A Wound Status: Healed - Epithelialized N/A N/A Measurements L x W x D 0x0x0 N/A N/A (cm) Area (cm) : 0 N/A N/A Volume (cm) : 0 N/A N/A % Reduction in Area: 100.00% N/A N/A % Reduction in Volume: 100.00% N/A N/A Classification: Category/Stage III N/A N/A Exudate Amount: None Present N/A N/A Wound  Margin: Flat and Intact N/A N/A Granulation Amount: None Present (0%) N/A N/A Necrotic Amount: Small (1-33%) N/A N/A Necrotic Tissue: Eschar N/A N/A Exposed Structures: Fascia: No N/A N/A Fat Layer (Subcutaneous Tissue) Exposed: No Tendon: No Muscle: No Joint: No Bone: No Epithelialization: Large (67-100%) N/A N/A Rebecca [name], Rebecca [name] ([id number]) Treatment Notes Electronic Signature(s) Signed: 11/16/2018 4:36:34 PM By: Montey Hora Entered By: Montey Hora on 11/16/2018 14:07:35 Rebecca [name] ([id number]) -------------------------------------------------------------------------------- [place] Details Patient Name: Rebecca [name]. Date of Service: 11/16/2018 1:30 PM Medical Record Number: [id number] Patient Account Number: [id number] Date of Birth/Sex: April 29, 1960 (58 y.o. F) Treating RN: Montey Hora Primary Care Jinx Gilden: Delsa Grana Other Clinician: Referring Daylyn Azbill: Delsa Grana Treating Tyreanna Bisesi/Extender: Melburn Hake, HOYT Weeks in Treatment: 6 Active Inactive Electronic Signature(s) Signed: 11/16/2018 4:36:34 PM By: Montey Hora Entered By: Montey Hora on 11/16/2018 14:07:28 Rebecca [name] ([id number]) -------------------------------------------------------------------------------- Pain Assessment Details Patient Name: Rebecca [name], Rebecca [name]. Date of Service: 11/16/2018 1:30 PM Medical Record Number: [id number] Patient Account Number: [id number] Date of Birth/Sex: [birth year] (58 y.o. F) Treating RN: Cornell Barman Primary  Care Nakeda Lebron: Delsa Grana Other Clinician: Referring Brodey Bonn: Delsa Grana Treating Britiny Defrain/Extender: Melburn Hake, HOYT Weeks in Treatment: 6 Active Problems Location of Pain Severity and Description of Pain Patient Has Paino No Site Locations Pain Management and Medication Current Pain Management: Notes Patient denies pain at this time Electronic Signature(s) Signed: 11/16/2018 4:44:03 PM By: Gretta Cool, BSN, RN, CWS, Kim RN, BSN Entered By: Gretta Cool, BSN, RN, CWS, Kim on 11/16/2018 13:43:53 Rebecca [name] ([id number]) -------------------------------------------------------------------------------- Patient/Caregiver Education Details Patient Name: Rebecca [name]. Date of Service: 11/16/2018 1:30 PM Medical Record Number: [id number] Patient Account Number: [id number] Date of Birth/Gender: 01-17-1961 (58 y.o. F) Treating RN: Montey Hora Primary Care Physician: Delsa Grana Other Clinician: Referring Physician: Delsa Grana Treating Physician/Extender: Sharalyn Ink in Treatment: 6 Education Assessment Education Provided To: Patient and Caregiver Education Topics Provided Basic Hygiene: Handouts: Other: skin care and pressure relief Methods: Explain/Verbal Responses: State content correctly Electronic Signature(s) Signed: 11/16/2018 4:36:34 PM By: Montey Hora Entered By: Montey Hora on 11/16/2018 14:08:54 Rebecca [name] ([id number]) -------------------------------------------------------------------------------- Wound Assessment Details Patient Name: Rebecca [name]. Date of Service: 11/16/2018 1:30 PM Medical Record Number: [id number] Patient Account Number: [id number] Date of Birth/Sex: May 31, 1960 (58 y.o. F) Treating RN: Montey Hora Primary Care Ariah Mower: Delsa Grana Other Clinician: Referring Emanual Lamountain: Delsa Grana Treating Kyrstan Gotwalt/Extender: Melburn Hake, HOYT Weeks in Treatment: 6 Wound Status Wound Number: 2 Primary Pressure Ulcer Etiology: Wound  Location: Left, Posterior Upper Leg Wound Status: Healed - Epithelialized Wounding Event: Pressure Injury Comorbid Arrhythmia, Seizure Disorder, Received Date Acquired: 09/25/2018 History: Chemotherapy Weeks Of Treatment: 6 Clustered Wound: No Photos Wound Measurements Length: (cm) 0 % Red Width: (cm) 0 % Red Depth: (cm) 0 Epith Area: (cm) 0 Tunn Volume: (cm) 0 Unde uction in Area: 100% uction in Volume: 100% elialization: Large (67-100%) eling: No rmining: No Wound Description Classification: Category/Stage III Wound Margin: Flat and Intact Exudate Amount: None Present Foul Odor After Cleansing: No Slough/Fibrino No Wound Bed Granulation Amount: None Present (0%) Exposed Structure Necrotic Amount: Small (1-33%) Fascia Exposed: No Necrotic Quality: Eschar Fat Layer (Subcutaneous Tissue) Exposed: No Tendon Exposed: No Muscle Exposed: No Joint Exposed: No Bone Exposed: No Electronic Signature(s) Signed: 11/16/2018 4:36:34 PM By: Montey Hora Entered By: Montey Hora on 11/16/2018 14:07:09 Rebecca [name] ([id number]) Rebecca [name], Rebecca Hauser M. ([id number]) -------------------------------------------------------------------------------- [place] Details Patient Name: Rebecca [name]. Date of Service: 11/16/2018 1:30 PM Medical Record Number:  [id number] Patient Account Number: [id number] Date of Birth/Sex: 27-Feb-1960 (58 y.o. F) Treating RN: Cornell Barman Primary Care Trebor Galdamez: Delsa Grana Other Clinician: Referring Nalini Alcaraz: Delsa Grana Treating Lawanda Holzheimer/Extender: Melburn Hake, HOYT Weeks in Treatment: 6 Vital Signs Time Taken: 13:43 Temperature (F): 98.2 Height (in): 64 Pulse (bpm): 67 Weight (lbs): 250 Respiratory Rate (breaths/min): 16 Body Mass Index (BMI): 42.9 Blood Pressure (mmHg): 143/64 Reference Range: 80 - 120 mg / dl Electronic Signature(s) Signed: 11/16/2018 4:44:03 PM By: Gretta Cool, BSN, RN, CWS, Kim RN, BSN Entered By: Gretta Cool, BSN, RN, CWS, Kim on 11/16/2018  13:44:17
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
## 2018-11-19 ENCOUNTER — Other Ambulatory Visit: Payer: Self-pay

## 2018-11-19 ENCOUNTER — Encounter: Payer: Self-pay | Admitting: Physical Medicine & Rehabilitation

## 2018-11-19 ENCOUNTER — Encounter: Payer: Medicare Other | Attending: Physical Medicine & Rehabilitation | Admitting: Physical Medicine & Rehabilitation

## 2018-11-19 VITALS — BP 139/83 | HR 68 | Temp 97.7°F | Ht 64.0 in | Wt 225.0 lb

## 2018-11-19 DIAGNOSIS — Z87440 Personal history of urinary (tract) infections: Secondary | ICD-10-CM | POA: Diagnosis not present

## 2018-11-19 DIAGNOSIS — I890 Lymphedema, not elsewhere classified: Secondary | ICD-10-CM

## 2018-11-19 DIAGNOSIS — N319 Neuromuscular dysfunction of bladder, unspecified: Secondary | ICD-10-CM

## 2018-11-19 DIAGNOSIS — R200 Anesthesia of skin: Secondary | ICD-10-CM | POA: Insufficient documentation

## 2018-11-19 DIAGNOSIS — R202 Paresthesia of skin: Secondary | ICD-10-CM | POA: Insufficient documentation

## 2018-11-19 DIAGNOSIS — G35 Multiple sclerosis: Secondary | ICD-10-CM | POA: Insufficient documentation

## 2018-11-19 DIAGNOSIS — N3289 Other specified disorders of bladder: Secondary | ICD-10-CM | POA: Insufficient documentation

## 2018-11-19 DIAGNOSIS — G8220 Paraplegia, unspecified: Secondary | ICD-10-CM | POA: Insufficient documentation

## 2018-11-19 DIAGNOSIS — R269 Unspecified abnormalities of gait and mobility: Secondary | ICD-10-CM | POA: Diagnosis not present

## 2018-11-19 DIAGNOSIS — G801 Spastic diplegic cerebral palsy: Secondary | ICD-10-CM

## 2018-11-19 NOTE — Patient Instructions (Addendum)
Please inquire regarding KAFO (Knee, ankle, foot orthosis) vs. HFAFO (Hip, knee, ankle, foot orthosis)

## 2018-11-19 NOTE — Progress Notes (Signed)
Subjective:    Patient ID: Rebecca [name], female    DOB: [birth year], 58 y.o.   MRN: [id number]   HPI  Female, history of chronic pain, lymphedema bilateral lower extremities, right breast cancer, chemotherapy, seizure disorder, multiple sclerosis presents for follow up for MS.  Last clinic visit 05/23/2018.  Husband supplements history. Since that time, pt states she doing HEP. She is in PT. She is seeing Neurology. She continues to take Baclofen and Tizanidine, less reliance on Tizanidine. She is no longer needing lymphedema. She had a fall due to weakness and sat on the floor. Using walker more than wheelchair. She did not get suprapubic cath because she was told she would have same difficulties with suprapubic cath.  She was given another medication with benefit.   Pain Inventory Average Pain 8 Pain Right Now 0 My pain is sharp, stabbing, tingling and aching  In the last 24 hours, has pain interfered with the following? General activity 0 Relation with others 5 Enjoyment of life 10 What TIME of day is your pain at its worst? daytime, night Sleep (in general) Good  Pain is worse with: sitting and inactivity Pain improves with: rest and medication Relief from Meds: 8  Mobility walk with assistance use a walker how many minutes can you walk? 15 ability to climb steps?  no do you drive?  no use a wheelchair transfers alone Do you have any goals in this area?  yes  Function disabled: date disabled . I need assistance with the following:  dressing, bathing, household duties and shopping Do you have any goals in this area?  yes  Neuro/Psych bladder control problems weakness numbness tingling trouble walking spasms  Prior Studies Any changes since last visit?  no    Physicians involved in your care Any changes since last visit?  no   Family History  Problem Relation Age of Onset  . Cancer Father        skin  . Heart disease Father   . Heart attack Father      heart attack in his 68's  . Thyroid disease Sister   . Ovarian cancer Cousin   . Breast cancer Maternal Aunt 60  . Breast cancer Maternal Grandmother 63  . Bladder Cancer Neg Hx   . Kidney cancer Neg Hx    Social History   Socioeconomic History  . Marital status: Married    Spouse name: don  . Number of children: 0  . Years of education: 48  . Highest education level: Not on file  Occupational History  . Occupation: disability  Social Needs  . Financial resource strain: Not on file  . Food insecurity    Worry: Not on file    Inability: Not on file  . Transportation needs    Medical: Not on file    Non-medical: Not on file  Tobacco Use  . Smoking status: Never Smoker  . Smokeless tobacco: Never Used  Substance and Sexual Activity  . Alcohol use: No  . Drug use: No  . Sexual activity: Not Currently    Partners: Male  Lifestyle  . Physical activity    Days per week: Not on file    Minutes per session: Not on file  . Stress: Not on file  Relationships  . Social Herbalist on phone: Not on file    Gets together: Not on file    Attends religious service: Not on file    Active member  of club or organization: Not on file    Attends meetings of clubs or organizations: Not on file    Relationship status: Not on file  Other Topics Concern  . Not on file  Social History Narrative   She is married. Recently moved back to [place] after being in [place] for some time. She is accompanied by her husband and aunt.   Never smoked. Never used alcohol.   Past Surgical History:  Procedure Laterality Date  . ANKLE SURGERY     Left  . ANKLE SURGERY    . ANTERIOR CERVICAL DECOMP/DISCECTOMY FUSION  11/17/2011   Procedure: ANTERIOR CERVICAL DECOMPRESSION/DISCECTOMY FUSION 2 LEVELS;  Surgeon: Erline Levine, MD;  Location: [facility];  Service: Neurosurgery;  Laterality: N/A;  Cervical Five-Six Six-Seven Anterior cervical decompression/diskectomy/fusion  . BREAST  BIOPSY Right 12-31-13   invasive mammary  . BREAST SURGERY Right 02/03/2014   Right simple mastectomy with sentinel node biopsy.  . CHOLECYSTECTOMY    . COLONOSCOPY  2014  . Lower extremity venous Dopplers  Feb 27, 2013   No LE DVT  . MASTECTOMY Right 2015  . Port a cath insertion Right 01/19/2010  . PORT-A-CATH REMOVAL     right  . PORT-A-CATH REMOVAL Right 09/03/2013   Procedure: REMOVAL PORT-A-CATH;  Surgeon: Conrad [place], MD;  Location: [facility];  Service: Vascular;  Laterality: Right;  . TRANSTHORACIC ECHOCARDIOGRAM  03/2013; 02/2014   a) Normal LV size and function with EF 60-65%.; Cannot exclude bicuspid aortic valve with mild AS and mild AI.; b) Normal EF with normal wall motion and valve function x Mild MR. G2 DD. EF 60-65%. Tricuspid AoV  . UPPER GI ENDOSCOPY  2014   Past Medical History:  Diagnosis Date  . Aortic valve disease    Mild AS / AI - most recent Echo demonstrated tricuspid aortic valve.  . Bacterial endocarditis    History of .  Marland Kitchen Bilateral lower extremity edema    Noncardiac.  Chronic. LE Venous dopplers - negative for DVT.; Echocardiogram January 2016: Normal EF with normal wall motion and valve function. Only grade 1 diastolic dysfunction. EF 60-65%. Mild MR  . Breast cancer ([place]) 12-31-13   Right breast, 12:00, 1.5 cm, T1c,N0 invasive mammary carcinoma, triple negative. --> Rx with Chemo  . Cervical stenosis of spine   . Herpes zoster   . IBS (irritable bowel syndrome)   . Lymphedema    has legs wrapped at [facility]  . Multiple sclerosis ([place]) 2001   Walks from room to room @ home; but Wheelchair when going out.  Marland Kitchen Neuromuscular disorder ([place])    MS  . Seizures ([place])    Takes Keppra  . Syncope and collapse    BP 139/83   Pulse 68   Temp 97.7 F (36.5 C) (Oral)   Ht 5\' 4"  (1.626 m) Comment: patient reporting  Wt 225 lb (102.1 kg) Comment: patient reporting  SpO2 98%   BMI 38.62 kg/m   Opioid Risk Score:   Fall Risk Score:  `1  Depression screen PHQ  2/9  Depression screen [facility] 2/9 10/22/2018 07/16/2018 05/23/2018 04/18/2018 12/26/2017 09/08/2017 06/23/2017  Decreased Interest 0 0 0 0 0 0 0  Down, Depressed, Hopeless 0 0 0 0 0 0 0  PHQ - 2 Score 0 0 0 0 0 0 0  Altered sleeping 0 0 - 0 0 - -  Tired, decreased energy 0 0 - 0 0 - -  Change in appetite 0 0 -  0 0 - -  Feeling bad or failure about yourself  0 0 - 0 0 - -  Trouble concentrating 0 0 - 0 0 - -  Moving slowly or fidgety/restless 0 0 - 0 0 - -  Suicidal thoughts 0 0 - 0 0 - -  PHQ-9 Score 0 0 - 0 0 - -  Difficult doing work/chores Not difficult at all Not difficult at all - Not difficult at all Not difficult at all - -  Some recent data might be hidden    Review of Systems  HENT: Negative.   Eyes: Negative.   Respiratory: Negative.   Cardiovascular: Positive for leg swelling.  Gastrointestinal: Negative.   Endocrine: Negative.   Genitourinary:       Has foley currently and has experienced UTIs since last seen  Musculoskeletal: Positive for arthralgias and gait problem.  Skin: Negative.   Allergic/Immunologic: Negative.   Neurological: Positive for weakness and numbness.       Tingling  Hematological: Negative.   Psychiatric/Behavioral: Negative.       Objective:   Physical Exam  Gen: NAD. Vital signs reviewed. Obese.  Head: Normocephalic. Atraumatic.  Eyes: EOMI. No discharge Cardiovascular: No JVD.  Respiratory: Unlabored. GI: Nondistended Musc: Edema b/l LE.  Right heel cord contracture Neurological. Alert and oriented  Fair awareness of deficits.  Motor: RUE: 5/5 proximal to distal  LUE: 5/5 proximal to distal RLE: 2/5 HF, 3-/5 KE, 2-/5 ADF/PF  LLE: 3+/5 HF, 4-/5 KE, 4-/5 ADF/PF MAS: Improving tone Skin. Chronic lymphedema lower extremities Psych: pleasant and cooperative    Assessment & Plan:  59 year old right-handed female, history of chronic pain, lymphedema bilateral lower extremities, right breast cancer, chemotherapy, seizure disorder, multiple  sclerosis presents for follow up for MS.   1. MS   Cont HEP, completed therapies  Cont meds   Cont follow up with Neurology  Plan for KAFO vs HAKFO after confirmation with therapies, will order accordingly  2.  Spastic paraparesis:   Cont ROM  Cont Baclofen  Cont tizanidine 2mg  BID PRN, now requiring less  Good benefit with combination  No need for Botox at this time, confirmed with therapies.   3. Chronic lymphedema  Overall improving  4. Gait abnormality  Cont HEP  Cont walker/wheelchair for safety, less reliance on wheelchair   5. Neurogenic bladder with spastic bladder  With recurrent UTIs  Cont meds  Cont follow up with Urology - holding off on suprapubic cath

## 2018-11-20 ENCOUNTER — Other Ambulatory Visit: Payer: Self-pay | Admitting: Obstetrics & Gynecology

## 2018-11-20 ENCOUNTER — Ambulatory Visit
Admission: RE | Admit: 2018-11-20 | Discharge: 2018-11-20 | Disposition: A | Discharge status: Home / Self Care | Payer: Medicare Other | Source: Ambulatory Visit | Attending: Obstetrics & Gynecology | Admitting: Obstetrics & Gynecology

## 2018-11-20 ENCOUNTER — Encounter: Payer: Self-pay | Admitting: Obstetrics & Gynecology

## 2018-11-20 ENCOUNTER — Ambulatory Visit (INDEPENDENT_AMBULATORY_CARE_PROVIDER_SITE_OTHER): Payer: Medicare Other | Admitting: Obstetrics & Gynecology

## 2018-11-20 VITALS — BP 122/82 | Ht 64.0 in | Wt 220.0 lb

## 2018-11-20 DIAGNOSIS — N950 Postmenopausal bleeding: Secondary | ICD-10-CM

## 2018-11-20 DIAGNOSIS — Z23 Encounter for immunization: Secondary | ICD-10-CM | POA: Diagnosis not present

## 2018-11-20 DIAGNOSIS — D219 Benign neoplasm of connective and other soft tissue, unspecified: Secondary | ICD-10-CM | POA: Diagnosis not present

## 2018-11-20 NOTE — Progress Notes (Signed)
PRE-OPERATIVE HISTORY AND PHYSICAL EXAM  HPI:  Rebecca [name] is a 58 y.o. G3P0020 No LMP recorded. Patient is postmenopausal.; she is being admitted for surgery related to post menopausal bleeding.  She has small fibroids noted on ultrasound, and an EMB that was not adequate for endometrial sampling.  She has not had bleeding for many years until recently.  She has MS. PAP normal 2020.   PMHx: Past Medical History:  Diagnosis Date  . Aortic valve disease    Mild AS / AI - most recent Echo demonstrated tricuspid aortic valve.  . Bacterial endocarditis    History of .  Marland Kitchen Bilateral lower extremity edema    Noncardiac.  Chronic. LE Venous dopplers - negative for DVT.; Echocardiogram January 2016: Normal EF with normal wall motion and valve function. Only grade 1 diastolic dysfunction. EF 60-65%. Mild MR  . Breast cancer ([place]) 12-31-13   Right breast, 12:00, 1.5 cm, T1c,N0 invasive mammary carcinoma, triple negative. --> Rx with Chemo  . Cervical stenosis of spine   . Herpes zoster   . IBS (irritable bowel syndrome)   . Lymphedema    has legs wrapped at [facility]  . Multiple sclerosis ([place]) 2001   Walks from room to room @ home; but Wheelchair when going out.  Marland Kitchen Neuromuscular disorder ([place])    MS  . Seizures ([place])    Takes Keppra  . Syncope and collapse    Past Surgical History:  Procedure Laterality Date  . ANKLE SURGERY     Left  . ANKLE SURGERY    . ANTERIOR CERVICAL DECOMP/DISCECTOMY FUSION  11/17/2011   Procedure: ANTERIOR CERVICAL DECOMPRESSION/DISCECTOMY FUSION 2 LEVELS;  Surgeon: Erline Levine, MD;  Location: [place] [facility];  Service: Neurosurgery;  Laterality: N/A;  Cervical Five-Six Six-Seven Anterior cervical decompression/diskectomy/fusion  . BREAST BIOPSY Right 12-31-13   invasive mammary  . BREAST SURGERY Right 02/03/2014   Right simple mastectomy with sentinel node biopsy.  . CHOLECYSTECTOMY    . COLONOSCOPY  2014  . Lower extremity venous Dopplers  Feb 27, 2013   No  LE DVT  . MASTECTOMY Right 2015  . Port a cath insertion Right 01/19/2010  . PORT-A-CATH REMOVAL     right  . PORT-A-CATH REMOVAL Right 09/03/2013   Procedure: REMOVAL PORT-A-CATH;  Surgeon: Conrad Florence, MD;  Location: [place];  Service: Vascular;  Laterality: Right;  . TRANSTHORACIC ECHOCARDIOGRAM  03/2013; 02/2014   a) Normal LV size and function with EF 60-65%.; Cannot exclude bicuspid aortic valve with mild AS and mild AI.; b) Normal EF with normal wall motion and valve function x Mild MR. G2 DD. EF 60-65%. Tricuspid AoV  . UPPER GI ENDOSCOPY  2014   Family History  Problem Relation Age of Onset  . Cancer Father        skin  . Heart disease Father   . Heart attack Father        heart attack in his 45's  . Thyroid disease Sister   . Ovarian cancer Cousin   . Breast cancer Maternal Aunt 60  . Breast cancer Maternal Grandmother 58  . Bladder Cancer Neg Hx   . Kidney cancer Neg Hx    Social History   Tobacco Use  . Smoking status: Never Smoker  . Smokeless tobacco: Never Used  Substance Use Topics  . Alcohol use: No  . Drug use: No    Current Outpatient Medications:  .  amantadine (SYMMETREL) 100 MG capsule,  Take 1 capsule (100 mg total) by mouth 3 (three) times daily., Disp: 270 capsule, Rfl: 4 .  baclofen (LIORESAL) 10 MG tablet, TAKE 1 TO 2 TABLETS BY MOUTH 3 TIMES A DAY (Patient taking differently: Take 10-20 mg by mouth See admin instructions. Take 2 tablets (20 mg) by mouth in the morning, 1 tablet (10 mg) by mouth at lunch, & 2 tablets (20 mg) by mouth at dinner.), Disp: 540 tablet, Rfl: 2 .  Disposable Gloves (ASSURANCE VINYL EXAM GLOVES) MISC, Neurogenic bladder; MS; LON 99 months; for use for personal hygiene, Disp: 50 each, Rfl: 11 .  gluconic acid-citric acid (RENACIDIN) irrigation, Irrigate with 30 mLs as directed 3 (three) times daily., Disp: 2700 mL, Rfl: 0 .  Glucosamine-Chondroitin (COSAMIN DS PO), Take 1 tablet by mouth 2 (two) times daily., Disp: , Rfl:  .   ibuprofen (ADVIL) 600 MG tablet, TAKE 1 TABLET (600 MG TOTAL) BY MOUTH EVERY 8 (EIGHT) HOURS AS NEEDED. (Patient taking differently: Take 600 mg by mouth every 8 (eight) hours as needed (pain.). ), Disp: 90 tablet, Rfl: 3 .  Interferon Beta-1a (REBIF REBIDOSE) 44 MCG/0.5ML SOAJ, Inject 0.5 mLs into the skin every Monday, Wednesday, and Friday. (Patient taking differently: Inject 44 mcg into the skin every Monday, Wednesday, and Friday. ), Disp: 12 Syringe, Rfl: 11 .  levETIRAcetam (KEPPRA) 500 MG tablet, Take 1 tablet (500 mg total) by mouth 2 (two) times daily., Disp: 180 tablet, Rfl: 4 .  loratadine (CLARITIN) 10 MG tablet, Take 10 mg by mouth daily., Disp: , Rfl:  .  Misc Natural Products (LEG VEIN & CIRCULATION) TABS, Take 1 tablet by mouth 2 (two) times daily. , Disp: , Rfl:  .  Multiple Vitamins-Minerals (HAIR SKIN AND NAILS FORMULA) TABS, Take 1 tablet by mouth 2 (two) times daily. , Disp: , Rfl:  .  Multiple Vitamins-Minerals (MULTIVITAMIN PO), Take 1 tablet by mouth daily. , Disp: , Rfl:  .  Multiple Vitamins-Minerals (PRESERVISION AREDS 2) CAPS, Take 1 capsule by mouth 2 (two) times daily., Disp: , Rfl:  .  oxybutynin (DITROPAN-XL) 10 MG 24 hr tablet, TAKE 2 TABLETS BY MOUTH EVERY DAY (Patient taking differently: Take 10 mg by mouth 2 (two) times daily. ), Disp: 180 tablet, Rfl: 3 .  tiZANidine (ZANAFLEX) 4 MG tablet, TAKE 1/2 TABLETS (2 MG TOTAL) BY MOUTH 2 (TWO) TIMES DAILY AS NEEDED (SPASTICITY UNCONTROLLED). (Patient taking differently: Take 2 mg by mouth 2 (two) times daily as needed (spasms). TAKE 1/2 TABLETS (2 MG TOTAL) BY MOUTH 2 (TWO) TIMES DAILY AS NEEDED (SPASTICITY UNCONTROLLED).), Disp: 90 tablet, Rfl: 1 .  Turmeric 500 MG CAPS, Take 500 mg by mouth daily., Disp: , Rfl:  Allergies: Fentanyl and Sulfa antibiotics  Review of Systems  Constitutional: Negative for chills, fever and malaise/fatigue.  HENT: Negative for congestion, sinus pain and sore throat.   Eyes: Negative for  blurred vision and pain.  Respiratory: Negative for cough and wheezing.   Cardiovascular: Negative for chest pain and leg swelling.  Gastrointestinal: Negative for abdominal pain, constipation, diarrhea, heartburn, nausea and vomiting.  Genitourinary: Negative for dysuria, frequency, hematuria and urgency.  Musculoskeletal: Negative for back pain, joint pain, myalgias and neck pain.  Skin: Negative for itching and rash.  Neurological: Negative for dizziness, tremors and weakness.  Endo/Heme/Allergies: Does not bruise/bleed easily.  Psychiatric/Behavioral: Negative for depression. The patient is not nervous/anxious and does not have insomnia.     Objective: BP 122/82   Ht 5\' 4"  (1.626 m)  Wt 220 lb (99.8 kg)   BMI 37.76 kg/m   Filed Weights   11/20/18 1323  Weight: 220 lb (99.8 kg)   Physical Exam Constitutional:      General: She is not in acute distress.    Appearance: She is well-developed.  HENT:     Head: Normocephalic and atraumatic. No laceration.     Right Ear: Hearing normal.     Left Ear: Hearing normal.     Mouth/Throat:     Pharynx: Uvula midline.  Eyes:     Pupils: Pupils are equal, round, and reactive to light.  Neck:     Musculoskeletal: Normal range of motion and neck supple.     Thyroid: No thyromegaly.  Cardiovascular:     Rate and Rhythm: Normal rate and regular rhythm.     Heart sounds: No murmur. No friction rub. No gallop.   Pulmonary:     Effort: Pulmonary effort is normal. No respiratory distress.     Breath sounds: Normal breath sounds. No wheezing.  Chest:     Breasts:        Right: No mass, skin change or tenderness.        Left: No mass, skin change or tenderness.  Abdominal:     General: Bowel sounds are normal. There is no distension.     Palpations: Abdomen is soft.     Tenderness: There is no abdominal tenderness. There is no rebound.  Musculoskeletal: Normal range of motion.     Right lower leg: Edema present.     Left lower leg:  Edema present.  Neurological:     Mental Status: She is alert and oriented to person, place, and time.     Cranial Nerves: No cranial nerve deficit.  Skin:    General: Skin is warm and dry.  Psychiatric:        Judgment: Judgment normal.  Vitals signs reviewed.     Assessment: 1. Post-menopausal bleeding   2. Fibroids   Plan Hysteroscopy D&C for diagnostic purposes  I have had a careful discussion with this patient about all the options available and the risk/benefits of each. I have fully informed this patient that surgery may subject her to a variety of discomforts and risks: She understands that most patients have surgery with little difficulty, but problems can happen ranging from minor to fatal. These include nausea, vomiting, pain, bleeding, infection, poor healing, hernia, or formation of adhesions. Unexpected reactions may occur from any drug or anesthetic given. Unintended injury may occur to other pelvic or abdominal structures such as Fallopian tubes, ovaries, bladder, ureter (tube from kidney to bladder), or bowel. Nerves going from the pelvis to the legs may be injured. Any such injury may require immediate or later additional surgery to correct the problem. Excessive blood loss requiring transfusion is very unlikely but possible. Dangerous blood clots may form in the legs or lungs. Physical and sexual activity will be restricted in varying degrees for an indeterminate period of time but most often 2-6 weeks.  Finally, she understands that it is impossible to list every possible undesirable effect and that the condition for which surgery is done is not always cured or significantly improved, and in rare cases may be even worse.Ample time was given to answer all questions.  Barnett Applebaum, MD, [facility], [facility] 11/20/2018  1:39 PM

## 2018-11-20 NOTE — H&P (View-Only) (Signed)
PRE-OPERATIVE HISTORY AND PHYSICAL EXAM  HPI:  Rebecca [name] is a 58 y.o. G3P0020 No LMP recorded. Patient is postmenopausal.; she is being admitted for surgery related to post menopausal bleeding.  She has small fibroids noted on ultrasound, and an EMB that was not adequate for endometrial sampling.  She has not had bleeding for many years until recently.  She has MS. PAP normal 2020.   PMHx: Past Medical History:  Diagnosis Date  . Aortic valve disease    Mild AS / AI - most recent Echo demonstrated tricuspid aortic valve.  . Bacterial endocarditis    History of .  Marland Kitchen Bilateral lower extremity edema    Noncardiac.  Chronic. LE Venous dopplers - negative for DVT.; Echocardiogram January 2016: Normal EF with normal wall motion and valve function. Only grade 1 diastolic dysfunction. EF 60-65%. Mild MR  . Breast cancer ([place]) 12-31-13   Right breast, 12:00, 1.5 cm, T1c,N0 invasive mammary carcinoma, triple negative. --> Rx with Chemo  . Cervical stenosis of spine   . Herpes zoster   . IBS (irritable bowel syndrome)   . Lymphedema    [facility] legs [facility] at [facility]  . Multiple sclerosis ([place]) 2001   Walks from room to room @ home; but Wheelchair when going out.  Marland Kitchen Neuromuscular disorder ([place])    MS  . Seizures ([place])    Takes Keppra  . Syncope and collapse    Past Surgical History:  Procedure Laterality Date  . ANKLE SURGERY     Left  . ANKLE SURGERY    . ANTERIOR CERVICAL DECOMP/DISCECTOMY FUSION  11/17/2011   Procedure: ANTERIOR CERVICAL DECOMPRESSION/DISCECTOMY FUSION 2 LEVELS;  Surgeon: Erline Levine, MD;  Location: [place] [facility];  Service: Neurosurgery;  Laterality: N/A;  Cervical Five-Six Six-Seven Anterior cervical decompression/diskectomy/fusion  . BREAST BIOPSY Right 12-31-13   invasive mammary  . BREAST SURGERY Right 02/03/2014   Right simple mastectomy with sentinel node biopsy.  . CHOLECYSTECTOMY    . COLONOSCOPY  2014  . Lower extremity venous Dopplers  Feb 27, 2013   No  LE DVT  . MASTECTOMY Right 2015  . Port a cath insertion Right 01/19/2010  . PORT-A-CATH REMOVAL     right  . PORT-A-CATH REMOVAL Right 09/03/2013   Procedure: REMOVAL PORT-A-CATH;  Surgeon: Conrad Riverside, MD;  Location: [place] [facility];  Service: Vascular;  Laterality: Right;  . TRANSTHORACIC ECHOCARDIOGRAM  03/2013; 02/2014   a) Normal LV size and function with EF 60-65%.; Cannot exclude bicuspid aortic valve with mild AS and mild AI.; b) Normal EF with normal wall motion and valve function x Mild MR. G2 DD. EF 60-65%. Tricuspid AoV  . UPPER GI ENDOSCOPY  2014   Family History  Problem Relation Age of Onset  . Cancer Father        skin  . Heart disease Father   . Heart attack Father        heart attack in his 43's  . Thyroid disease Sister   . Ovarian cancer Cousin   . Breast cancer Maternal Aunt 60  . Breast cancer Maternal Grandmother 32  . Bladder Cancer Neg Hx   . Kidney cancer Neg Hx    Social History   Tobacco Use  . Smoking status: Never Smoker  . Smokeless tobacco: Never Used  Substance Use Topics  . Alcohol use: No  . Drug use: No    Current Outpatient Medications:  .  amantadine (SYMMETREL) 100 MG capsule,  Take 1 capsule (100 mg total) by mouth 3 (three) times daily., Disp: 270 capsule, Rfl: 4 .  baclofen (LIORESAL) 10 MG tablet, TAKE 1 TO 2 TABLETS BY MOUTH 3 TIMES A DAY (Patient taking differently: Take 10-20 mg by mouth See admin instructions. Take 2 tablets (20 mg) by mouth in the morning, 1 tablet (10 mg) by mouth at lunch, & 2 tablets (20 mg) by mouth at dinner.), Disp: 540 tablet, Rfl: 2 .  Disposable Gloves (ASSURANCE VINYL EXAM GLOVES) MISC, Neurogenic bladder; MS; LON 99 months; for use for personal hygiene, Disp: 50 each, Rfl: 11 .  gluconic acid-citric acid (RENACIDIN) irrigation, Irrigate with 30 mLs as directed 3 (three) times daily., Disp: 2700 mL, Rfl: 0 .  Glucosamine-Chondroitin (COSAMIN DS PO), Take 1 tablet by mouth 2 (two) times daily., Disp: , Rfl:  .   ibuprofen (ADVIL) 600 MG tablet, TAKE 1 TABLET (600 MG TOTAL) BY MOUTH EVERY 8 (EIGHT) HOURS AS NEEDED. (Patient taking differently: Take 600 mg by mouth every 8 (eight) hours as needed (pain.). ), Disp: 90 tablet, Rfl: 3 .  Interferon Beta-1a (REBIF REBIDOSE) 44 MCG/0.5ML SOAJ, Inject 0.5 mLs into the skin every Monday, Wednesday, and Friday. (Patient taking differently: Inject 44 mcg into the skin every Monday, Wednesday, and Friday. ), Disp: 12 Syringe, Rfl: 11 .  levETIRAcetam (KEPPRA) 500 MG tablet, Take 1 tablet (500 mg total) by mouth 2 (two) times daily., Disp: 180 tablet, Rfl: 4 .  loratadine (CLARITIN) 10 MG tablet, Take 10 mg by mouth daily., Disp: , Rfl:  .  Misc Natural Products (LEG VEIN & CIRCULATION) TABS, Take 1 tablet by mouth 2 (two) times daily. , Disp: , Rfl:  .  Multiple Vitamins-Minerals (HAIR SKIN AND NAILS FORMULA) TABS, Take 1 tablet by mouth 2 (two) times daily. , Disp: , Rfl:  .  Multiple Vitamins-Minerals (MULTIVITAMIN PO), Take 1 tablet by mouth daily. , Disp: , Rfl:  .  Multiple Vitamins-Minerals (PRESERVISION AREDS 2) CAPS, Take 1 capsule by mouth 2 (two) times daily., Disp: , Rfl:  .  oxybutynin (DITROPAN-XL) 10 MG 24 hr tablet, TAKE 2 TABLETS BY MOUTH EVERY DAY (Patient taking differently: Take 10 mg by mouth 2 (two) times daily. ), Disp: 180 tablet, Rfl: 3 .  tiZANidine (ZANAFLEX) 4 MG tablet, TAKE 1/2 TABLETS (2 MG TOTAL) BY MOUTH 2 (TWO) TIMES DAILY AS NEEDED (SPASTICITY UNCONTROLLED). (Patient taking differently: Take 2 mg by mouth 2 (two) times daily as needed (spasms). TAKE 1/2 TABLETS (2 MG TOTAL) BY MOUTH 2 (TWO) TIMES DAILY AS NEEDED (SPASTICITY UNCONTROLLED).), Disp: 90 tablet, Rfl: 1 .  Turmeric 500 MG CAPS, Take 500 mg by mouth daily., Disp: , Rfl:  Allergies: Fentanyl and Sulfa antibiotics  Review of Systems  Constitutional: Negative for chills, fever and malaise/fatigue.  HENT: Negative for congestion, sinus pain and sore throat.   Eyes: Negative for  blurred vision and pain.  Respiratory: Negative for cough and wheezing.   Cardiovascular: Negative for chest pain and leg swelling.  Gastrointestinal: Negative for abdominal pain, constipation, diarrhea, heartburn, nausea and vomiting.  Genitourinary: Negative for dysuria, frequency, hematuria and urgency.  Musculoskeletal: Negative for back pain, joint pain, myalgias and neck pain.  Skin: Negative for itching and rash.  Neurological: Negative for dizziness, tremors and weakness.  Endo/Heme/Allergies: Does not bruise/bleed easily.  Psychiatric/Behavioral: Negative for depression. The patient is not nervous/anxious and does not have insomnia.     Objective: BP 122/82   Ht 5\' 4"  (1.626 m)  Wt 220 lb (99.8 kg)   BMI 37.76 kg/m   Filed Weights   11/20/18 1323  Weight: 220 lb (99.8 kg)   Physical Exam Constitutional:      General: She is not in acute distress.    Appearance: She is well-developed.  HENT:     Head: Normocephalic and atraumatic. No laceration.     Right Ear: Hearing normal.     Left Ear: Hearing normal.     Mouth/Throat:     Pharynx: Uvula midline.  Eyes:     Pupils: Pupils are equal, round, and reactive to light.  Neck:     Musculoskeletal: Normal range of motion and neck supple.     Thyroid: No thyromegaly.  Cardiovascular:     Rate and Rhythm: Normal rate and regular rhythm.     Heart sounds: No murmur. No friction rub. No gallop.   Pulmonary:     Effort: Pulmonary effort is normal. No respiratory distress.     Breath sounds: Normal breath sounds. No wheezing.  Chest:     Breasts:        Right: No mass, skin change or tenderness.        Left: No mass, skin change or tenderness.  Abdominal:     General: Bowel sounds are normal. There is no distension.     Palpations: Abdomen is soft.     Tenderness: There is no abdominal tenderness. There is no rebound.  Musculoskeletal: Normal range of motion.     Right lower leg: Edema present.     Left lower leg:  Edema present.  Neurological:     Mental Status: She is alert and oriented to person, place, and time.     Cranial Nerves: No cranial nerve deficit.  Skin:    General: Skin is warm and dry.  Psychiatric:        Judgment: Judgment normal.  Vitals signs reviewed.     Assessment: 1. Post-menopausal bleeding   2. Fibroids   Plan Hysteroscopy D&C for diagnostic purposes  I have had a careful discussion with this patient about all the options available and the risk/benefits of each. I have fully informed this patient that surgery may subject her to a variety of discomforts and risks: She understands that most patients have surgery with little difficulty, but problems can happen ranging from minor to fatal. These include nausea, vomiting, pain, bleeding, infection, poor healing, hernia, or formation of adhesions. Unexpected reactions may occur from any drug or anesthetic given. Unintended injury may occur to other pelvic or abdominal structures such as Fallopian tubes, ovaries, bladder, ureter (tube from kidney to bladder), or bowel. Nerves going from the pelvis to the legs may be injured. Any such injury may require immediate or later additional surgery to correct the problem. Excessive blood loss requiring transfusion is very unlikely but possible. Dangerous blood clots may form in the legs or lungs. Physical and sexual activity will be restricted in varying degrees for an indeterminate period of time but most often 2-6 weeks.  Finally, she understands that it is impossible to list every possible undesirable effect and that the condition for which surgery is done is not always cured or significantly improved, and in rare cases may be even worse.Ample time was given to answer all questions.  Barnett Applebaum, MD, [facility], [facility] 11/20/2018  1:39 PM

## 2018-11-20 NOTE — Patient Instructions (Signed)
Hysteroscopy, Care After °This sheet gives you information about how to care for yourself after your procedure. Your health care provider may also give you more specific instructions. If you have problems or questions, contact your health care provider. °What can I expect after the procedure? °After the procedure, it is common to have: °· Cramping. °· Bleeding. This can vary from light spotting to menstrual-like bleeding. °Follow these instructions at home: °Activity °· Rest for 1-2 days after the procedure. °· Do not douche, use tampons, or have sex for 2 weeks after the procedure, or until your health care provider approves. °· Do not drive for 24 hours after the procedure, or for as long as told by your health care provider. °· Do not drive, use heavy machinery, or drink alcohol while taking prescription pain medicines. °Medicines ° °· Take over-the-counter and prescription medicines only as told by your health care provider. °· Do not take aspirin during recovery. It can increase the risk of bleeding. °General instructions °· Do not take baths, swim, or use a hot tub until your health care provider approves. Take showers instead of baths for 2 weeks, or for as long as told by your health care provider. °· To prevent or treat constipation while you are taking prescription pain medicine, your health care provider may recommend that you: °? Drink enough fluid to keep your urine clear or pale yellow. °? Take over-the-counter or prescription medicines. °? Eat foods that are high in fiber, such as fresh fruits and vegetables, whole grains, and beans. °? Limit foods that are high in fat and processed sugars, such as fried and sweet foods. °· Keep all follow-up visits as told by your health care provider. This is important. °Contact a health care provider if: °· You feel dizzy or lightheaded. °· You feel nauseous. °· You have abnormal vaginal discharge. °· You have a rash. °· You have pain that does not get better with  medicine. °· You have chills. °Get help right away if: °· You have bleeding that is heavier than a normal menstrual period. °· You have a fever. °· You have pain or cramps that get worse. °· You develop new abdominal pain. °· You faint. °· You have pain in your shoulders. °· You have shortness of breath. °Summary °· After the procedure, you may have cramping and some vaginal bleeding. °· Do not douche, use tampons, or have sex for 2 weeks after the procedure, or until your health care provider approves. °· Do not take baths, swim, or use a hot tub until your health care provider approves. Take showers instead of baths for 2 weeks, or for as long as told by your health care provider. °· Report any unusual symptoms to your health care provider. °· Keep all follow-up visits as told by your health care provider. This is important. °This information is not intended to replace advice given to you by your health care provider. Make sure you discuss any questions you have with your health care provider. °Document Released: 11/14/2012 Document Revised: 01/06/2017 Document Reviewed: 02/23/2016 °Elsevier Patient Education © 2020 Elsevier Inc. ° °

## 2018-11-21 ENCOUNTER — Encounter
Admission: RE | Admit: 2018-11-21 | Discharge: 2018-11-21 | Disposition: A | Discharge status: Home / Self Care | Payer: Medicare Other | Source: Ambulatory Visit | Attending: Obstetrics & Gynecology | Admitting: Obstetrics & Gynecology

## 2018-11-21 ENCOUNTER — Other Ambulatory Visit: Payer: Self-pay

## 2018-11-21 HISTORY — DX: Other specified postprocedural states: Z98.890

## 2018-11-21 HISTORY — DX: Nausea with vomiting, unspecified: R11.2

## 2018-11-21 NOTE — Patient Instructions (Addendum)
Your procedure is scheduled on: Tuesday 11/27/18.   Report to DAY SURGERY DEPARTMENT LOCATED ON 2ND FLOOR MEDICAL MALL ENTRANCE. To find out your arrival time please call [phone] between 1PM - 3PM on  11/26/18.    Remember: Instructions that are not followed completely may result in serious medical risk, up to and including death, or upon the discretion of your surgeon and anesthesiologist your surgery may need to be rescheduled.      _X__ 1. Do not eat food after midnight the night before your procedure.                 No gum chewing or hard candies. You may drink clear liquids up to 2 hours                 before you are scheduled to arrive for your surgery- DO NOT drink clear                 liquids within 2 hours of the start of your surgery.                 Clear Liquids include:  water, apple juice without pulp, clear carbohydrate                 drink such as Clearfast or Gatorade, Black Coffee or Tea (Do not add                 milk or creamer to coffee or tea).    __X__2.  On the morning of surgery brush your teeth with toothpaste and water, you may rinse your mouth with mouthwash if you wish.  Do not swallow any toothpaste or mouthwash.       __X__3.  Notify your doctor if there is any change in your medical condition      (cold, fever, infections).       Do not wear jewelry, make-up, hairpins, clips or nail polish. Do not wear lotions, powders, or perfumes.  Do not shave 48 hours prior to surgery. Men may shave face and neck. Do not bring valuables to the hospital.     [facility] is not responsible for any belongings or valuables.    Contacts, dentures/partials or body piercings may not be worn into surgery. Bring a case for your contacts, glasses or hearing aids, a denture cup will be supplied.     Patients discharged the day of surgery will not be allowed to drive home.   __X__ Take these medicines the morning of surgery with A SIP OF WATER:      1. amantadine (SYMMETREL) 100 MG capsule  2. baclofen (LIORESAL) 10 MG tablet  3. levETIRAcetam (KEPPRA) 500 MG tablet  4. loratadine (CLARITIN) 10 MG tablet  5. oxybutynin (DITROPAN-XL) 10 MG 24 hr tablet  6. tiZANidine (ZANAFLEX) 4 MG tablet if needed    __X__ Stop Anti-inflammatories 7 days before surgery such as Advil, Ibuprofen, Motrin, BC or Goodies Powder, Naprosyn, Naproxen, Aleve, Aspirin, Meloxicam. May take Tylenol if needed for pain or discomfort.     __X__ Stop the following herbal supplements:    Turmeric 500 MG CAPS  Multiple Vitamins-Minerals (HAIR SKIN AND NAILS FORMULA) TABS  Misc Natural Products (LEG VEIN & CIRCULATION) TABS

## 2018-11-23 ENCOUNTER — Other Ambulatory Visit: Payer: Self-pay

## 2018-11-23 ENCOUNTER — Other Ambulatory Visit
Admission: RE | Admit: 2018-11-23 | Discharge: 2018-11-23 | Disposition: A | Discharge status: Home / Self Care | Payer: Medicare Other | Source: Ambulatory Visit | Attending: Obstetrics & Gynecology | Admitting: Obstetrics & Gynecology

## 2018-11-23 DIAGNOSIS — Z20828 Contact with and (suspected) exposure to other viral communicable diseases: Secondary | ICD-10-CM | POA: Insufficient documentation

## 2018-11-23 DIAGNOSIS — Z01818 Encounter for other preprocedural examination: Secondary | ICD-10-CM | POA: Diagnosis present

## 2018-11-23 LAB — CBC
HCT: 38 % (ref 36.0–46.0)
Hemoglobin: 12.6 g/dL (ref 12.0–15.0)
MCH: 30.2 pg (ref 26.0–34.0)
MCHC: 33.2 g/dL (ref 30.0–36.0)
MCV: 91.1 fL (ref 80.0–100.0)
Platelets: 148 10*3/uL — ABNORMAL LOW (ref 150–400)
RBC: 4.17 MIL/uL (ref 3.87–5.11)
RDW: 12.8 % (ref 11.5–15.5)
WBC: 4.2 10*3/uL (ref 4.0–10.5)
nRBC: 0 % (ref 0.0–0.2)

## 2018-11-23 LAB — TYPE AND SCREEN
ABO/RH(D): O POS
Antibody Screen: NEGATIVE

## 2018-11-23 LAB — SARS CORONAVIRUS 2 (TAT 6-24 HRS): SARS Coronavirus 2: NEGATIVE

## 2018-11-27 ENCOUNTER — Encounter
Admission: RE | Disposition: A | Discharge status: Home / Self Care | Payer: Self-pay | Source: Home / Self Care | Attending: Obstetrics & Gynecology

## 2018-11-27 ENCOUNTER — Ambulatory Visit: Payer: Medicare Other | Admitting: Anesthesiology

## 2018-11-27 ENCOUNTER — Ambulatory Visit
Admission: RE | Admit: 2018-11-27 | Discharge: 2018-11-27 | Disposition: A | Discharge status: Home / Self Care | Payer: Medicare Other | Attending: Obstetrics & Gynecology | Admitting: Obstetrics & Gynecology

## 2018-11-27 ENCOUNTER — Other Ambulatory Visit: Payer: Self-pay

## 2018-11-27 ENCOUNTER — Encounter: Payer: Self-pay | Admitting: *Deleted

## 2018-11-27 DIAGNOSIS — Z8249 Family history of ischemic heart disease and other diseases of the circulatory system: Secondary | ICD-10-CM | POA: Insufficient documentation

## 2018-11-27 DIAGNOSIS — G35 Multiple sclerosis: Secondary | ICD-10-CM | POA: Diagnosis not present

## 2018-11-27 DIAGNOSIS — Z981 Arthrodesis status: Secondary | ICD-10-CM | POA: Diagnosis not present

## 2018-11-27 DIAGNOSIS — N840 Polyp of corpus uteri: Secondary | ICD-10-CM

## 2018-11-27 DIAGNOSIS — Z79899 Other long term (current) drug therapy: Secondary | ICD-10-CM | POA: Insufficient documentation

## 2018-11-27 DIAGNOSIS — K589 Irritable bowel syndrome without diarrhea: Secondary | ICD-10-CM | POA: Diagnosis not present

## 2018-11-27 DIAGNOSIS — Z853 Personal history of malignant neoplasm of breast: Secondary | ICD-10-CM | POA: Insufficient documentation

## 2018-11-27 DIAGNOSIS — N950 Postmenopausal bleeding: Secondary | ICD-10-CM | POA: Diagnosis not present

## 2018-11-27 HISTORY — PX: HYSTEROSCOPY WITH D & C: SHX1775

## 2018-11-27 LAB — ABO/RH: ABO/RH(D): O POS

## 2018-11-27 LAB — POCT PREGNANCY, URINE: Preg Test, Ur: NEGATIVE

## 2018-11-27 SURGERY — DILATATION AND CURETTAGE /HYSTEROSCOPY
Anesthesia: General

## 2018-11-27 MED ORDER — PROPOFOL 10 MG/ML IV BOLUS
INTRAVENOUS | Status: DC | PRN
  Administered 2018-11-27 (×2): 100 mg via INTRAVENOUS

## 2018-11-27 MED ORDER — FENTANYL CITRATE (PF) 100 MCG/2ML IJ SOLN
INTRAMUSCULAR | Status: AC
  Filled 2018-11-27: qty 2

## 2018-11-27 MED ORDER — OXYCODONE HCL 5 MG/5ML PO SOLN: 5.0000 mg | Freq: Once | ORAL | Status: DC | PRN

## 2018-11-27 MED ORDER — ONDANSETRON HCL 4 MG/2ML IJ SOLN
INTRAMUSCULAR | Status: DC | PRN
  Administered 2018-11-27: 4 mg via INTRAVENOUS

## 2018-11-27 MED ORDER — ACETAMINOPHEN 650 MG RE SUPP
650.0000 mg | RECTAL | Status: DC | PRN
  Filled 2018-11-27: qty 1

## 2018-11-27 MED ORDER — LACTATED RINGERS IV SOLN: INTRAVENOUS | Status: DC

## 2018-11-27 MED ORDER — PROPOFOL 10 MG/ML IV BOLUS
INTRAVENOUS | Status: AC
  Filled 2018-11-27: qty 20

## 2018-11-27 MED ORDER — DEXAMETHASONE SODIUM PHOSPHATE 10 MG/ML IJ SOLN
INTRAMUSCULAR | Status: DC | PRN
  Administered 2018-11-27: 10 mg via INTRAVENOUS

## 2018-11-27 MED ORDER — FAMOTIDINE 20 MG PO TABS
20.0000 mg | ORAL_TABLET | Freq: Once | ORAL | Status: AC
  Administered 2018-11-27: 20 mg via ORAL

## 2018-11-27 MED ORDER — FAMOTIDINE 20 MG PO TABS
ORAL_TABLET | ORAL | Status: AC
  Filled 2018-11-27: qty 1

## 2018-11-27 MED ORDER — LACTATED RINGERS IV SOLN
INTRAVENOUS | Status: DC
  Administered 2018-11-27: 09:00:00 via INTRAVENOUS

## 2018-11-27 MED ORDER — LACTATED RINGERS IV SOLN
INTRAVENOUS | Status: DC | PRN
  Administered 2018-11-27: 10:00:00 via INTRAVENOUS

## 2018-11-27 MED ORDER — ACETAMINOPHEN 325 MG PO TABS: 650.0000 mg | ORAL_TABLET | ORAL | Status: DC | PRN

## 2018-11-27 MED ORDER — MIDAZOLAM HCL 2 MG/2ML IJ SOLN
INTRAMUSCULAR | Status: AC
  Filled 2018-11-27: qty 2

## 2018-11-27 MED ORDER — MIDAZOLAM HCL 2 MG/2ML IJ SOLN
INTRAMUSCULAR | Status: DC | PRN
  Administered 2018-11-27 (×2): 1 mg via INTRAVENOUS

## 2018-11-27 MED ORDER — ONDANSETRON HCL 4 MG/2ML IJ SOLN: 4.0000 mg | Freq: Once | INTRAMUSCULAR | Status: DC | PRN

## 2018-11-27 MED ORDER — OXYCODONE-ACETAMINOPHEN 5-325 MG PO TABS: 1.0000 | ORAL_TABLET | ORAL | Status: DC | PRN

## 2018-11-27 MED ORDER — SODIUM CHLORIDE FLUSH 0.9 % IV SOLN
INTRAVENOUS | Status: AC
  Filled 2018-11-27: qty 10

## 2018-11-27 MED ORDER — OXYCODONE-ACETAMINOPHEN 5-325 MG PO TABS: 1.0000 | ORAL_TABLET | ORAL | 0 refills | Status: DC | PRN

## 2018-11-27 MED ORDER — OXYCODONE HCL 5 MG PO TABS: 5.0000 mg | ORAL_TABLET | Freq: Once | ORAL | Status: DC | PRN

## 2018-11-27 MED ORDER — MORPHINE SULFATE (PF) 4 MG/ML IV SOLN: 1.0000 mg | INTRAVENOUS | Status: DC | PRN

## 2018-11-27 SURGICAL SUPPLY — 15 items
CATH ROBINSON RED A/P 16FR (CATHETERS) ×2 IMPLANT
COVER WAND RF STERILE (DRAPES) IMPLANT
DEVICE MYOSURE LITE (MISCELLANEOUS) ×1 IMPLANT
GLOVE BIO SURGEON STRL SZ8 (GLOVE) ×2 IMPLANT
GOWN STRL REUS W/ TWL LRG LVL3 (GOWN DISPOSABLE) ×1 IMPLANT
GOWN STRL REUS W/ TWL XL LVL3 (GOWN DISPOSABLE) ×1 IMPLANT
GOWN STRL REUS W/TWL LRG LVL3 (GOWN DISPOSABLE) ×1
GOWN STRL REUS W/TWL XL LVL3 (GOWN DISPOSABLE) ×1
KIT PROCEDURE FLUENT (KITS) ×2 IMPLANT
KIT TURNOVER CYSTO (KITS) ×2 IMPLANT
PACK DNC HYST (MISCELLANEOUS) ×2 IMPLANT
PAD OB MATERNITY 4.3X12.25 (PERSONAL CARE ITEMS) ×2 IMPLANT
PAD PREP 24X41 OB/GYN DISP (PERSONAL CARE ITEMS) ×2 IMPLANT
SEAL ROD LENS SCOPE MYOSURE (ABLATOR) ×2 IMPLANT
TOWEL OR 17X26 4PK STRL BLUE (TOWEL DISPOSABLE) ×2 IMPLANT

## 2018-11-27 NOTE — Interval H&P Note (Signed)
History and Physical Interval Note:  11/27/2018 8:00 AM  Rebecca [name]  has presented today for surgery, with the diagnosis of N95.0 POSTMENOPAUSAL BLEEDING.  The various methods of treatment have been discussed with the patient and family. After consideration of risks, benefits and other options for treatment, the patient has consented to  Procedure(s): DILATATION AND CURETTAGE /HYSTEROSCOPY (N/A) as a surgical intervention.  The patient's history has been reviewed, patient examined, no change in status, stable for surgery.  I have reviewed the patient's chart and labs.  Questions were answered to the patient's satisfaction.     Hoyt Koch

## 2018-11-27 NOTE — Transfer of Care (Signed)
Immediate Anesthesia Transfer of Care Note  Patient: Rebecca [name]  Procedure(s) Performed: DILATATION AND CURETTAGE /HYSTEROSCOPY (N/A )  Patient Location: PACU  Anesthesia Type:General  Level of Consciousness: sedated  Airway & Oxygen Therapy: Patient Spontanous Breathing and Patient connected to nasal cannula oxygen  Post-op Assessment: Report given to RN and Post -op Vital signs reviewed and stable  Post vital signs: Reviewed and stable  Last Vitals:  Vitals Value Taken Time  BP 132/81 11/27/18 1105  Temp    Pulse 75 11/27/18 1107  Resp 12 11/27/18 1107  SpO2 100 % 11/27/18 1107  Vitals shown include unvalidated device data.  Last Pain:  Vitals:   11/27/18 0828  TempSrc: Oral  PainSc: 0-No pain         Complications: No apparent anesthesia complications

## 2018-11-27 NOTE — Anesthesia Post-op Follow-up Note (Signed)
Anesthesia QCDR form completed.        

## 2018-11-27 NOTE — Op Note (Signed)
Operative Note  11/27/2018  PRE-OP DIAGNOSIS: Postmenopausal Bleeding  POST-OP DIAGNOSIS: same   SURGEON: Barnett Applebaum, MD, FACOG  PROCEDURE: Procedure(s): DILATATION AND CURETTAGE /HYSTEROSCOPY   ANESTHESIA: Choice   ESTIMATED BLOOD LOSS: Min   SPECIMENS:  ECC, EMC  FLUID DEFICIT: Min  COMPLICATIONS: None  DISPOSITION: PACU - hemodynamically stable.  CONDITION: stable  FINDINGS: Exam under anesthesia revealed small, mobile  uterus with no masses and bilateral adnexa without masses or fullness. Hysteroscopy revealed a  grossly normal appearing uterine cavity with a small polyp arrising from the posterior surface of the uterus; and with bilateral tubal ostia and normal appearing endocervical canal.  PROCEDURE IN DETAIL: After informed consent was obtained, the patient was taken to the operating room where anesthesia was obtained without difficulty. The patient was positioned in the dorsal lithotomy position in [place]. The patient's bladder was catheterized with an in and out foley catheter. The patient was examined under anesthesia, with the above noted findings. The weightedspeculum was placed inside the patient's vagina, and the the anterior lip of the cervix was seen and grasped with the tenaculum.  An Endocervical specimen was obtained with a kevorkian curette. The uterine cavity was sounded to 8cm, and then the cervix was progressively dilated to a 18French-Pratt dilator. The 30 degree hysteroscope was introduced, with saline fluid used to distend the intrauterine cavity, with the above noted findings.  The hystersocope was removed and the uterine cavity was curetted using the Myosure device to extract polyp and portions of lining, yielding endometrial curettings. Excellent hemostasis was noted, and all instruments were removed, with excellent hemostasis noted throughout. She was then taken out of dorsal lithotomy. Minimal discrepancy in fluid was noted.  The patient  tolerated the procedure well. Sponge, lap and needle counts were correct x2. The patient was taken to recovery room in excellent condition.  Barnett Applebaum, MD, [facility], [facility] 11/27/2018  10:52 AM

## 2018-11-27 NOTE — Discharge Instructions (Signed)
Hysteroscopy, Care After °This sheet gives you information about how to care for yourself after your procedure. Your health care provider may also give you more specific instructions. If you have problems or questions, contact your health care provider. °What can I expect after the procedure? °After the procedure, it is common to have: °· Cramping. °· Bleeding. This can vary from light spotting to menstrual-like bleeding. °Follow these instructions at home: °Activity °· Rest for 1-2 days after the procedure. °· Do not douche, use tampons, or have sex for 2 weeks after the procedure, or until your health care provider approves. °· Do not drive for 24 hours after the procedure, or for as long as told by your health care provider. °· Do not drive, use heavy machinery, or drink alcohol while taking prescription pain medicines. °Medicines ° °· Take over-the-counter and prescription medicines only as told by your health care provider. °· Do not take aspirin during recovery. It can increase the risk of bleeding. °General instructions °· Do not take baths, swim, or use a hot tub until your health care provider approves. Take showers instead of baths for 2 weeks, or for as long as told by your health care provider. °· To prevent or treat constipation while you are taking prescription pain medicine, your health care provider may recommend that you: °? Drink enough fluid to keep your urine clear or pale yellow. °? Take over-the-counter or prescription medicines. °? Eat foods that are high in fiber, such as fresh fruits and vegetables, whole grains, and beans. °? Limit foods that are high in fat and processed sugars, such as fried and sweet foods. °· Keep all follow-up visits as told by your health care provider. This is important. °Contact a health care provider if: °· You feel dizzy or lightheaded. °· You feel nauseous. °· You have abnormal vaginal discharge. °· You have a rash. °· You have pain that does not get better with  medicine. °· You have chills. °Get help right away if: °· You have bleeding that is heavier than a normal menstrual period. °· You have a fever. °· You have pain or cramps that get worse. °· You develop new abdominal pain. °· You faint. °· You have pain in your shoulders. °· You have shortness of breath. °Summary °· After the procedure, you may have cramping and some vaginal bleeding. °· Do not douche, use tampons, or have sex for 2 weeks after the procedure, or until your health care provider approves. °· Do not take baths, swim, or use a hot tub until your health care provider approves. Take showers instead of baths for 2 weeks, or for as long as told by your health care provider. °· Report any unusual symptoms to your health care provider. °· Keep all follow-up visits as told by your health care provider. This is important. °This information is not intended to replace advice given to you by your health care provider. Make sure you discuss any questions you have with your health care provider. °Document Released: 11/14/2012 Document Revised: 01/06/2017 Document Reviewed: 02/23/2016 °Elsevier Patient Education © 2020 Elsevier Inc. ° °AMBULATORY SURGERY  °DISCHARGE INSTRUCTIONS ° ° °1) The drugs that you were given will stay in your system until tomorrow so for the next 24 hours you should not: ° °A) Drive an automobile °B) Make any legal decisions °C) Drink any alcoholic beverage ° ° °2) You may resume regular meals tomorrow.  Today it is better to start with liquids and gradually work   up to solid foods. ° °You may eat anything you prefer, but it is better to start with liquids, then soup and crackers, and gradually work up to solid foods. ° ° °3) Please notify your doctor immediately if you have any unusual bleeding, trouble breathing, redness and pain at the surgery site, drainage, fever, or pain not relieved by medication. ° ° ° °4) Additional Instructions: ° ° ° ° ° ° ° °Please contact your physician with any  problems or Same Day Surgery at [phone], Monday through Friday 6 am to 4 pm, or [place] at [facility] Main number at [phone]. ° °

## 2018-11-27 NOTE — Anesthesia Preprocedure Evaluation (Signed)
Anesthesia Evaluation  Patient identified by MRN, date of birth, ID band Patient awake    Reviewed: Allergy & Precautions, H&P , NPO status , Patient's Chart, lab work & pertinent test results, reviewed documented beta blocker date and time   History of Anesthesia Complications (+) PONV and history of anesthetic complications  Airway Mallampati: III  TM Distance: >3 FB Neck ROM: full    Dental  (+) Dental Advidsory Given, Missing, Teeth Intact   Pulmonary neg pulmonary ROS,    Pulmonary exam normal        Cardiovascular Exercise Tolerance: Good negative cardio ROS Normal cardiovascular exam     Neuro/Psych Seizures -,  PSYCHIATRIC DISORDERS  Neuromuscular disease    GI/Hepatic negative GI ROS, Neg liver ROS,   Endo/Other  negative endocrine ROS  Renal/GU negative Renal ROS  negative genitourinary   Musculoskeletal   Abdominal   Peds  Hematology  (+) Blood dyscrasia, anemia ,   Anesthesia Other Findings Past Medical History: No date: Aortic valve disease     Comment:  Mild AS / AI - most recent Echo demonstrated tricuspid               aortic valve. No date: Bacterial endocarditis     Comment:  History of . No date: Bilateral lower extremity edema     Comment:  Noncardiac.  Chronic. LE Venous dopplers - negative for               DVT.; Echocardiogram January 2016: Normal EF with normal               wall motion and valve function. Only grade 1 diastolic               dysfunction. EF 60-65%. Mild MR 12-31-13: Breast cancer ([place])     Comment:  Right breast, 12:00, 1.5 cm, T1c,N0 invasive mammary               carcinoma, triple negative. --> Rx with Chemo No date: Cervical stenosis of spine No date: Herpes zoster No date: IBS (irritable bowel syndrome) No date: Lymphedema     Comment:  has legs wrapped at [place]: Multiple sclerosis ([place])     Comment:  Walks from room to room @ home; but Wheelchair when              going out. No date: Neuromuscular disorder ([place])     Comment:  MS No date: PONV (postoperative nausea and vomiting)     Comment:  Related to Fentanyl No date: Seizures ([facility])     Comment:  Takes Keppra No date: Syncope and collapse   Reproductive/Obstetrics negative OB ROS                             Anesthesia Physical Anesthesia Plan  ASA: II  Anesthesia Plan: General   Post-op Pain Management:    Induction: Intravenous  PONV Risk Score and Plan: 3 and Ondansetron, Dexamethasone, Midazolam, Promethazine and Treatment may vary due to age or medical condition  Airway Management Planned: LMA  Additional Equipment:   Intra-op Plan:   Post-operative Plan: Extubation in OR  Informed Consent: I have reviewed the patients History and Physical, chart, labs and discussed the procedure including the risks, benefits and alternatives for the proposed anesthesia with the patient or authorized representative who has indicated his/her understanding and acceptance.     Dental Advisory Given  Plan Discussed  with: Anesthesiologist, CRNA and Surgeon  Anesthesia Plan Comments:         Anesthesia Quick Evaluation

## 2018-11-27 NOTE — Anesthesia Procedure Notes (Signed)
Procedure Name: LMA Insertion Date/Time: 11/27/2018 10:19 AM Performed by: Justus Memory, CRNA Pre-anesthesia Checklist: Patient identified, Patient being monitored, Timeout performed, Emergency Drugs available and Suction available Patient Re-evaluated:Patient Re-evaluated prior to induction Oxygen Delivery Method: Circle system utilized Preoxygenation: Pre-oxygenation with 100% oxygen Induction Type: IV induction Ventilation: Mask ventilation without difficulty LMA: LMA inserted LMA Size: 4.5 Tube type: Oral Number of attempts: 1 Placement Confirmation: positive ETCO2 and breath sounds checked- equal and bilateral Tube secured with: Tape Dental Injury: Teeth and Oropharynx as per pre-operative assessment

## 2018-11-27 NOTE — Anesthesia Postprocedure Evaluation (Signed)
Anesthesia Post Note  Patient: Rebecca [name]  Procedure(s) Performed: DILATATION AND CURETTAGE /HYSTEROSCOPY (N/A )  Patient location during evaluation: PACU Anesthesia Type: General Level of consciousness: awake and alert Pain management: pain level controlled Vital Signs Assessment: post-procedure vital signs reviewed and stable Respiratory status: spontaneous breathing, nonlabored ventilation, respiratory function stable and patient connected to nasal cannula oxygen Cardiovascular status: blood pressure returned to baseline and stable Postop Assessment: no apparent nausea or vomiting Anesthetic complications: no     Last Vitals:  Vitals:   11/27/18 1134 11/27/18 1149  BP: (!) 145/85 (!) 151/80  Pulse: 70 64  Resp: 15 14  Temp:  (!) 36.1 C  SpO2: 99% 100%    Last Pain:  Vitals:   11/27/18 1149  TempSrc:   PainSc: 0-No pain                 Precious Haws Jasey Cortez

## 2018-11-28 ENCOUNTER — Encounter: Payer: Self-pay | Admitting: Obstetrics & Gynecology

## 2018-11-28 LAB — SURGICAL PATHOLOGY

## 2018-11-29 ENCOUNTER — Other Ambulatory Visit: Payer: Self-pay | Admitting: Obstetrics & Gynecology

## 2018-11-29 LAB — URINE CULTURE: Culture: 70000 — AB

## 2018-11-29 MED ORDER — CIPROFLOXACIN HCL 500 MG PO TABS: 500.0000 mg | ORAL_TABLET | Freq: Two times a day (BID) | ORAL | 0 refills | Status: DC

## 2018-11-29 NOTE — Progress Notes (Signed)
Make her appt on 11/6 at 150 a TELE appt; pt aware

## 2018-11-29 NOTE — Progress Notes (Signed)
Let her know eRx for UTI found from urine culture the other day has been called in

## 2018-11-29 NOTE — Progress Notes (Signed)
Pt aware.

## 2018-11-30 ENCOUNTER — Telehealth: Payer: Self-pay | Admitting: Family Medicine

## 2018-12-06 ENCOUNTER — Ambulatory Visit: Payer: Self-pay | Admitting: Physician Assistant

## 2018-12-06 ENCOUNTER — Telehealth: Payer: Self-pay

## 2018-12-06 NOTE — Telephone Encounter (Signed)
Patient is calling to report the home health nurse came by and checked on her. She said there is no more bleeding.

## 2018-12-06 NOTE — Telephone Encounter (Signed)
Please let me know when pt returns my call. She called triage about bleeding still from surgery. She needs to be seen or go to ER if we do not have an opening since [facility] out of office

## 2018-12-11 ENCOUNTER — Inpatient Hospital Stay: Payer: Medicare Other | Attending: Oncology

## 2018-12-11 ENCOUNTER — Other Ambulatory Visit: Payer: Self-pay

## 2018-12-11 DIAGNOSIS — Z853 Personal history of malignant neoplasm of breast: Secondary | ICD-10-CM | POA: Insufficient documentation

## 2018-12-11 DIAGNOSIS — Z95828 Presence of other vascular implants and grafts: Secondary | ICD-10-CM

## 2018-12-11 DIAGNOSIS — Z452 Encounter for adjustment and management of vascular access device: Secondary | ICD-10-CM | POA: Diagnosis present

## 2018-12-11 MED ORDER — SODIUM CHLORIDE 0.9% FLUSH
10.0000 mL | Freq: Once | INTRAVENOUS | Status: AC
  Administered 2018-12-11: 10 mL via INTRAVENOUS
  Filled 2018-12-11: qty 10

## 2018-12-11 MED ORDER — HEPARIN SOD (PORK) LOCK FLUSH 100 UNIT/ML IV SOLN
500.0000 [IU] | Freq: Once | INTRAVENOUS | Status: AC
  Administered 2018-12-11: 500 [IU] via INTRAVENOUS

## 2018-12-12 ENCOUNTER — Telehealth (INDEPENDENT_AMBULATORY_CARE_PROVIDER_SITE_OTHER): Payer: Medicare Other | Admitting: Diagnostic Neuroimaging

## 2018-12-12 ENCOUNTER — Encounter: Payer: Self-pay | Admitting: Diagnostic Neuroimaging

## 2018-12-12 DIAGNOSIS — G35 Multiple sclerosis: Secondary | ICD-10-CM | POA: Diagnosis not present

## 2018-12-12 DIAGNOSIS — R5383 Other fatigue: Secondary | ICD-10-CM | POA: Diagnosis not present

## 2018-12-12 DIAGNOSIS — N39498 Other specified urinary incontinence: Secondary | ICD-10-CM

## 2018-12-12 DIAGNOSIS — M62838 Other muscle spasm: Secondary | ICD-10-CM | POA: Diagnosis not present

## 2018-12-12 DIAGNOSIS — G40909 Epilepsy, unspecified, not intractable, without status epilepticus: Secondary | ICD-10-CM | POA: Diagnosis not present

## 2018-12-12 MED ORDER — LEVETIRACETAM 500 MG PO TABS: 500.0000 mg | ORAL_TABLET | Freq: Two times a day (BID) | ORAL | 4 refills | Status: DC

## 2018-12-12 MED ORDER — AMANTADINE HCL 100 MG PO CAPS: 100.0000 mg | ORAL_CAPSULE | Freq: Three times a day (TID) | ORAL | 4 refills | Status: DC

## 2018-12-12 MED ORDER — OXYBUTYNIN CHLORIDE ER 10 MG PO TB24: 10.0000 mg | ORAL_TABLET | Freq: Two times a day (BID) | ORAL | 4 refills | Status: DC

## 2018-12-12 NOTE — Progress Notes (Signed)
    Virtual Visit via VIDEO VISIT  I connected with Rebecca [name] on 12/12/18 at  2:30 PM EST by video call and verified that I am speaking with the correct person using two identifiers.   I discussed the limitations, risks, security and privacy concerns of performing an evaluation and management service by telephone and the availability of in person appointments. I also discussed with the patient that there may be a patient responsible charge related to this service. The patient expressed understanding and agreed to proceed.   History of Present Illness:  - since last visit doing well; no new issues.  - had a D/C recently - tolerating rebif - continuing with gait issues    Observations/Objective:  - awake and alert; mild dysarthria   Assessment and Plan:   58 y.o. 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 Last dose tysabri 10/29/10. Switched to [place] on 04/25/11.   Had brief seizure before starting gilenya, now on levetiracetam.   Then with right V2, V3 herpes zoster on 05/27/11. Gilenya stopped. Shingles resolved.  Then on rebif. Also s/p ACDF C5-6 (Oct 2013) for spinal stenosis.   Then with breast CA diagnosis, s/p right mastectomy, s/p adriamycin, cytoxan, taxol. Now treatments have been completed.   Now back on rebif (since Jan 2017).  Now with new onset headaches (June 2017). MRI brain unremarkable. HA now improving.   Dx:  1. Multiple sclerosis ([place])   2. Seizure disorder ([place])   3. Muscle spasm   4. Other fatigue   5. Other urinary incontinence      MULTIPLE SCLEROSIS (established problem, stable) - continue rebif  URINARY INCONTINENCE / RECURRENT UTI - continue oxybutynin - follow up with urology  MUSCLE SPASMS - continue baclofen and tizanidine for muscle spasms (per Dr. Posey Pronto; stable)  MS fatigue - continue amantadine for fatigue (stable)  SEIZURE DISORDER  - continue levetiracetam for seizure d/o (stable)  Meds ordered this encounter  Medications  . levETIRAcetam (KEPPRA) 500 MG tablet    Sig: Take 1 tablet (500 mg total) by mouth 2 (two) times daily.    Dispense:  180 tablet    Refill:  4  . oxybutynin (DITROPAN-XL) 10 MG 24 hr tablet    Sig: Take 1 tablet (10 mg total) by mouth 2 (two) times daily.    Dispense:  180 tablet    Refill:  4  . amantadine (SYMMETREL) 100 MG capsule    Sig: Take 1 capsule (100 mg total) by mouth 3 (three) times daily.    Dispense:  270 capsule    Refill:  4     Follow Up Instructions:  - Return in about 9 months (around 09/11/2019). video visit    I discussed the assessment and treatment plan with the patient. The patient was provided an opportunity to ask questions and all were answered. The patient agreed with the plan and demonstrated an understanding of the instructions.   The patient was advised to call back or seek an in-person evaluation if the symptoms worsen or if the condition fails to improve as anticipated.  I provided 15 minutes of non-face-to-face time during this encounter.   Penni Bombard, MD [id number], [id number] PM Certified in Neurology, Neurophysiology and Neuroimaging  [facility] [address] [place], [place] [address] [phone]

## 2018-12-14 ENCOUNTER — Other Ambulatory Visit: Payer: Self-pay

## 2018-12-14 ENCOUNTER — Encounter: Payer: Self-pay | Admitting: Obstetrics & Gynecology

## 2018-12-14 ENCOUNTER — Ambulatory Visit (INDEPENDENT_AMBULATORY_CARE_PROVIDER_SITE_OTHER): Payer: Medicare Other | Admitting: Obstetrics & Gynecology

## 2018-12-14 VITALS — Ht 64.0 in | Wt 220.0 lb

## 2018-12-14 DIAGNOSIS — N950 Postmenopausal bleeding: Secondary | ICD-10-CM | POA: Diagnosis not present

## 2018-12-14 DIAGNOSIS — D219 Benign neoplasm of connective and other soft tissue, unspecified: Secondary | ICD-10-CM

## 2018-12-14 NOTE — Progress Notes (Signed)
Virtual Visit via Telephone Note  I connected with Rebecca [name] on 12/14/18 at  1:50 PM EST by telephone and verified that I am speaking with the correct person using two identifiers.   I discussed the limitations, risks, security and privacy concerns of performing an evaluation and management service by telephone and the availability of in person appointments. I also discussed with the patient that there may be a patient responsible charge related to this service. The patient expressed understanding and agreed to proceed. She was at home and I was in my office.  History of Present Illness: Patient presents post op from operative hysteroscopy for PMB, 2 weeks ago.  Subjective: Patient reports some old blood still coming out vaginally; some improvement in her preop symptoms. Eating a regular diet without difficulty. The patient is not having any pain.  Activity: normal activities of daily living. Patient reports additional symptom's since surgery of None.  Pathology: DIAGNOSIS:  A. ENDOCERVIX; CURETTAGE:  - SUPERFICIAL FRAGMENTS OF BENIGN ENDO AND ECTOCERVICAL TISSUE.  - PORTION OF BENIGN LOWER UTERINE SEGMENT.  - NEGATIVE FOR ATYPICAL HYPERPLASIA/EIN, DYSPLASIA, AND MALIGNANCY.   B. ENDOMETRIUM; CURETTAGE:  - FRAGMENTS OF BENIGN ENDOMETRIAL POLYP.  - BACKGROUND OF WEAKLY PROLIFERATIVE ENDOMETRIUM.  - NEGATIVE FOR ATYPICAL HYPERPLASIA/EIN AND MALIGNANCY   Observations/Objective: No exam today, due to telephone eVisit due to [facility] virus restriction on elective visits and procedures.  Prior visits reviewed along with ultrasounds/labs as indicated.  Assessment and Plan:   ICD-10-CM   1. Post-menopausal bleeding  N95.0   2. Fibroids  D21.9   Reassured as to operative findings.  No cancer and this is reassuring. No activity restrictions Monitor bleeding  Follow Up Instructions: As needed   I discussed the assessment and treatment plan with the patient. The patient was provided an  opportunity to ask questions and all were answered. The patient agreed with the plan and demonstrated an understanding of the instructions.   The patient was advised to call back or seek an in-person evaluation if the symptoms worsen or if the condition fails to improve as anticipated.  I provided 15 minutes of non-face-to-face time during this encounter.   Hoyt Koch, MD

## 2018-12-27 ENCOUNTER — Telehealth: Payer: Self-pay | Admitting: Family Medicine

## 2018-12-27 DIAGNOSIS — G35 Multiple sclerosis: Secondary | ICD-10-CM

## 2018-12-27 DIAGNOSIS — G8220 Paraplegia, unspecified: Secondary | ICD-10-CM

## 2018-12-27 NOTE — Telephone Encounter (Signed)
Pt is needing order for xxl briefs send to [facility] in [place] on [address]

## 2018-12-31 NOTE — Telephone Encounter (Signed)
Notified, will send in by end of the day

## 2018-12-31 NOTE — Telephone Encounter (Signed)
Pt's spouse calling to check on this.  States that the medical supply company doesn't have the order for this yet.

## 2018-12-31 NOTE — Telephone Encounter (Signed)
Can you please write out an Rx for this, I'll sign and then we'll need to fax?    thanks

## 2019-01-07 ENCOUNTER — Ambulatory Visit: Payer: Medicare Other | Admitting: Physician Assistant

## 2019-01-10 ENCOUNTER — Other Ambulatory Visit: Payer: Self-pay

## 2019-01-10 ENCOUNTER — Ambulatory Visit (INDEPENDENT_AMBULATORY_CARE_PROVIDER_SITE_OTHER): Payer: Medicare Other | Admitting: Physician Assistant

## 2019-01-10 ENCOUNTER — Encounter: Payer: Self-pay | Admitting: Physician Assistant

## 2019-01-10 VITALS — BP 150/74 | HR 78 | Ht 67.0 in | Wt 220.0 lb

## 2019-01-10 DIAGNOSIS — Z466 Encounter for fitting and adjustment of urinary device: Secondary | ICD-10-CM

## 2019-01-10 NOTE — Progress Notes (Signed)
Cath Change/ Replacement  Patient is present today for a catheter change due to urinary retention.  79ml of water was removed from the balloon, a 16FR foley cath was removed with out difficulty.  Patient was cleaned and prepped in a sterile fashion with betadine. A 16 FR foley cath was replaced into the bladder no complications were noted Urine return was noted 44ml and urine was yellow in color. The balloon was filled with 75ml of sterile water. A leg bag was attached for drainage.  An additional leg bag was also given to the patient and patient was given instruction on how to change from one bag to another. Patient was given proper instruction on catheter care.    Performed by: Debroah Loop, PA-C and Gaspar Cola, CMA  Follow up: Return in about 4 weeks (around 02/07/2019) for Catheter exchange.

## 2019-01-11 ENCOUNTER — Telehealth: Payer: Self-pay | Admitting: *Deleted

## 2019-01-11 DIAGNOSIS — R269 Unspecified abnormalities of gait and mobility: Secondary | ICD-10-CM

## 2019-01-11 DIAGNOSIS — G35 Multiple sclerosis: Secondary | ICD-10-CM

## 2019-01-11 DIAGNOSIS — G801 Spastic diplegic cerebral palsy: Secondary | ICD-10-CM

## 2019-01-11 NOTE — Telephone Encounter (Signed)
Faxed the referral and a [facility] RX to the number supplied [phone].

## 2019-01-11 NOTE — Telephone Encounter (Signed)
Order placed for KAFO

## 2019-01-11 NOTE — Telephone Encounter (Signed)
Rebecca [name] with [facility] called to request a brace for [name]. She is requesting an Rx for a "K AFO" be faxed to the [facility] in [place].  Fax number is [phone].

## 2019-01-14 ENCOUNTER — Ambulatory Visit: Payer: Medicare Other | Admitting: Family Medicine

## 2019-01-18 ENCOUNTER — Encounter: Payer: Self-pay | Admitting: Family Medicine

## 2019-01-18 ENCOUNTER — Other Ambulatory Visit: Payer: Self-pay

## 2019-01-18 ENCOUNTER — Ambulatory Visit (INDEPENDENT_AMBULATORY_CARE_PROVIDER_SITE_OTHER): Payer: Medicare Other | Admitting: Family Medicine

## 2019-01-18 VITALS — BP 136/84 | HR 88 | Temp 97.9°F | Resp 16

## 2019-01-18 DIAGNOSIS — G801 Spastic diplegic cerebral palsy: Secondary | ICD-10-CM

## 2019-01-18 DIAGNOSIS — Z741 Need for assistance with personal care: Secondary | ICD-10-CM | POA: Diagnosis not present

## 2019-01-18 DIAGNOSIS — R8290 Unspecified abnormal findings in urine: Secondary | ICD-10-CM

## 2019-01-18 DIAGNOSIS — G8220 Paraplegia, unspecified: Secondary | ICD-10-CM

## 2019-01-18 DIAGNOSIS — R269 Unspecified abnormalities of gait and mobility: Secondary | ICD-10-CM | POA: Diagnosis not present

## 2019-01-18 DIAGNOSIS — N319 Neuromuscular dysfunction of bladder, unspecified: Secondary | ICD-10-CM

## 2019-01-18 DIAGNOSIS — R262 Difficulty in walking, not elsewhere classified: Secondary | ICD-10-CM | POA: Diagnosis not present

## 2019-01-18 DIAGNOSIS — N390 Urinary tract infection, site not specified: Secondary | ICD-10-CM

## 2019-01-18 DIAGNOSIS — Z5181 Encounter for therapeutic drug level monitoring: Secondary | ICD-10-CM

## 2019-01-18 DIAGNOSIS — G35 Multiple sclerosis: Secondary | ICD-10-CM

## 2019-01-18 DIAGNOSIS — D696 Thrombocytopenia, unspecified: Secondary | ICD-10-CM

## 2019-01-18 NOTE — Patient Instructions (Signed)
Follow up with your health insurance and the medical supply store for the walker - we will be working from our end on submitting all documentation and diagnosis to [employer].

## 2019-01-18 NOTE — Progress Notes (Signed)
Name: Rebecca [name]   MRN: [id number]    DOB: 12/28/1960   Date:01/18/2019       Progress Note  Chief Complaint  Patient presents with  . Follow-up  . Multiple Sclerosis     Subjective:   Rebecca [name] is a 58 y.o. female, presents to clinic for routine follow up on the conditions listed above.  Home Health liberty - She has a follow up with home health this next week to reassess.   She has had 7d a week care, that was increased recently when husband was in the hospital and then upon discharge he was unable to care for her.  Husband is still dealing with declined health and various ortho issues is unable to do all care for wife, specifically he is limited on his ability to help lift her and transfer her.    MS - [facility] With Dr. Leta Baptist  Pt has a broken walker - it broke recently while working with PT, PT advised pt she needs a new walker - PT Alisha and Elizabeth, doing Home PT - they have requested K AFO brace - Dr. Posey Pronto did order last week for leg brace.  Walker should be wider and sturdy to be safe for her to work with, with PT  Right leg slides, is ambulatory with help with right leg and with walker, needs assistance standing,   Walker - Pt advised that a wider walker would be better, needs order.  Pt also mentions that home health has noticed urine color has been abnormal lately with some sediment.  Pt has foley with leg bag, has been having infections and she does see urology.  She denies any abd pain, gross hematuria, fever, chills, sweats, confusion, worsening energy, appetite, coordination from her baseline.  Wants urine checked.    Patient Active Problem List   Diagnosis Date Noted  . Post-menopausal bleeding 11/20/2018  . Ambulatory dysfunction 02/02/2018  . Abnormality of gait 11/17/2017  . 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 Bilateral lower extremity pain (primary) (bilateral) (right greater than left)  08/01/2016  . Chronic neck pain (secondary) (bilateral) ( left greater than right) 07/28/2016  . Long term current use of opiate analgesic 07/28/2016  . Long term prescription opiate use 07/28/2016  . Opiate use 07/28/2016  . Neutropenia ([place]) 06/27/2016  . Chronic pain syndrome 06/03/2016  . Adjustment disorder with mixed anxiety and depressed mood   . Neurogenic bladder   . Neurogenic bowel   . Detrusor and sphincter dyssynergia   . Urinary incontinence   . Seizure disorder ([place])   . Slow transit constipation   . 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[place])   . Lymphedema   . Hypoalbuminemia due to protein-calorie malnutrition ([place])   . Encounter for screening for cervical cancer  11/27/2015  . Preventative health care 11/25/2015  . History of breast cancer in female 10/28/2015  . Anemia 10/28/2015  . Thrombocytopenia ([place]) 10/28/2015  . Allergic rhinitis 10/28/2015  . IBS (irritable bowel syndrome) 10/28/2015  . 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 History of right mastectomy 10/24/2014  . Medicare annual wellness visit, subsequent 10/24/2014  . MS (multiple sclerosis) ([place]) 09/16/2014  . Hematoma complicating a procedure 03/06/2014  . Primary cancer of right female breast ([place]) 01/10/2014  . SOB (shortness of breath) 03/01/2013  . Bilateral lower extremity edema   . 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 ANKLE  SURGERY     Left  . ANKLE SURGERY    . ANTERIOR CERVICAL DECOMP/DISCECTOMY FUSION  11/17/2011   Procedure: ANTERIOR CERVICAL DECOMPRESSION/DISCECTOMY FUSION 2 LEVELS;  Surgeon: Erline Levine, MD;  Location: [facility];  Service: Neurosurgery;  Laterality: N/A;  Cervical Five-Six Six-Seven Anterior cervical decompression/diskectomy/fusion  . BREAST BIOPSY Right 12-31-13   invasive mammary  . BREAST SURGERY Right 02/03/2014   Right simple mastectomy with sentinel node biopsy.  . CHOLECYSTECTOMY    . COLONOSCOPY  2014  .  HYSTEROSCOPY W/D&C N/A 11/27/2018   Procedure: DILATATION AND CURETTAGE /HYSTEROSCOPY;  Surgeon: Gae Dry, MD;  Location: [facility];  Service: Gynecology;  Laterality: N/A;  . Lower extremity venous Dopplers  Feb 27, 2013   No LE DVT  . MASTECTOMY Right 2015  . Port a cath insertion Right 01/19/2010  . PORT-A-CATH REMOVAL     right  . PORT-A-CATH REMOVAL Right 09/03/2013   Procedure: REMOVAL PORT-A-CATH;  Surgeon: Conrad Annetta, MD;  Location: [facility];  Service: Vascular;  Laterality: Right;  . TONSILLECTOMY    . TRANSTHORACIC ECHOCARDIOGRAM  03/2013; 02/2014   a) Normal LV size and function with EF 60-65%.; Cannot exclude bicuspid aortic valve with mild AS and mild AI.; b) Normal EF with normal wall motion and valve function x Mild MR. G2 DD. EF 60-65%. Tricuspid AoV  . UPPER GI ENDOSCOPY  2014    Family History  Problem Relation Age of Onset  . Cancer Father        skin  . Heart disease Father   . Heart attack Father        heart attack in his 85's  . Thyroid disease Sister   . Ovarian cancer Cousin   . Breast cancer Maternal Aunt 60  . Breast cancer Maternal Grandmother 108  . Bladder Cancer Neg Hx   . Kidney cancer Neg Hx     Social History   Socioeconomic History  . Marital status: Married    Spouse name: don  . Number of children: 0  . Years of education: 69  . Highest education level: Not on file  Occupational History  . Occupation: disability  Tobacco Use  . Smoking status: Never Smoker  . Smokeless tobacco: Never Used  Substance and Sexual Activity  . Alcohol use: No  . Drug use: No  . Sexual activity: Not Currently    Partners: Male  Other Topics Concern  . Not on file  Social History Narrative   She is married. Recently moved back to [place] after being in [place] for some time. She is accompanied by her husband and aunt.   Never smoked. Never used alcohol.   Social Determinants of Health   Financial Resource Strain:   . Difficulty of  Paying Living Expenses: Not on file  Food Insecurity:   . Worried About Charity fundraiser in the Last Year: Not on file  . Ran Out of Food in the Last Year: Not on file  Transportation Needs:   . Lack of Transportation (Medical): Not on file  . Lack of Transportation (Non-Medical): Not on file  Physical Activity:   . Days of Exercise per Week: Not on file  . Minutes of Exercise per Session: Not on file  Stress:   . Feeling of Stress : Not on file  Social Connections:   . Frequency of Communication with Friends and Family: Not on file  . Frequency of Social Gatherings with Friends and Family:  Not on file  . Attends Religious Services: Not on file  . Active Member of Clubs or Organizations: Not on file  . Attends Archivist Meetings: Not on file  . Marital Status: Not on file  Intimate Partner Violence: Not At Risk  . Fear of Current or Ex-Partner: No  . Emotionally Abused: No  . Physically Abused: No  . Sexually Abused: No     Current Outpatient Medications:  .  amantadine (SYMMETREL) 100 MG capsule, Take 1 capsule (100 mg total) by mouth 3 (three) times daily., Disp: 270 capsule, Rfl: 4 .  baclofen (LIORESAL) 10 MG tablet, TAKE 1 TO 2 TABLETS BY MOUTH 3 TIMES A DAY (Patient taking differently: Take 10-20 mg by mouth See admin instructions. Take 2 tablets (20 mg) by mouth in the morning, 1 tablet (10 mg) by mouth at lunch, & 2 tablets (20 mg) by mouth at dinner.), Disp: 540 tablet, Rfl: 2 .  Glucosamine-Chondroitin (COSAMIN DS PO), Take 1 tablet by mouth 2 (two) times daily., Disp: , Rfl:  .  ibuprofen (ADVIL) 600 MG tablet, TAKE 1 TABLET (600 MG TOTAL) BY MOUTH EVERY 8 (EIGHT) HOURS AS NEEDED. (Patient taking differently: Take 600 mg by mouth every 8 (eight) hours as needed (pain.). ), Disp: 90 tablet, Rfl: 3 .  Interferon Beta-1a (REBIF REBIDOSE) 44 MCG/0.5ML SOAJ, Inject 0.5 mLs into the skin every Monday, Wednesday, and Friday. (Patient taking differently: Inject 44 mcg  into the skin every Monday, Wednesday, and Friday. ), Disp: 12 Syringe, Rfl: 11 .  levETIRAcetam (KEPPRA) 500 MG tablet, Take 1 tablet (500 mg total) by mouth 2 (two) times daily., Disp: 180 tablet, Rfl: 4 .  loratadine (CLARITIN) 10 MG tablet, Take 10 mg by mouth daily., Disp: , Rfl:  .  Misc Natural Products (LEG VEIN & CIRCULATION) TABS, Take 1 tablet by mouth 2 (two) times daily. , Disp: , Rfl:  .  Multiple Vitamins-Minerals (HAIR SKIN AND NAILS FORMULA) TABS, Take 1 tablet by mouth 2 (two) times daily. , Disp: , Rfl:  .  Multiple Vitamins-Minerals (MULTIVITAMIN PO), Take 1 tablet by mouth daily. , Disp: , Rfl:  .  Multiple Vitamins-Minerals (PRESERVISION AREDS 2) CAPS, Take 1 capsule by mouth 2 (two) times daily., Disp: , Rfl:  .  oxybutynin (DITROPAN-XL) 10 MG 24 hr tablet, Take 1 tablet (10 mg total) by mouth 2 (two) times daily., Disp: 180 tablet, Rfl: 4 .  tiZANidine (ZANAFLEX) 4 MG tablet, TAKE 1/2 TABLETS (2 MG TOTAL) BY MOUTH 2 (TWO) TIMES DAILY AS NEEDED (SPASTICITY UNCONTROLLED). (Patient taking differently: Take 2 mg by mouth 2 (two) times daily as needed (spasms). TAKE 1/2 TABLETS (2 MG TOTAL) BY MOUTH 2 (TWO) TIMES DAILY AS NEEDED (SPASTICITY UNCONTROLLED).), Disp: 90 tablet, Rfl: 1 .  Turmeric 500 MG CAPS, Take 500 mg by mouth daily., Disp: , Rfl:  .  Disposable Gloves (ASSURANCE VINYL EXAM GLOVES) MISC, Neurogenic bladder; MS; LON 99 months; for use for personal hygiene, Disp: 50 each, Rfl: 11  Allergies  Allergen Reactions  . Fentanyl Nausea And Vomiting and Nausea Only    vomiting Was given in PACU x3 each time patient got sick vomiting Was given in PACU x3 each time patient got sick  . Sulfa Antibiotics Hives and Other (See Comments)    Light headed, over heated    I personally reviewed active problem list, medication list, allergies, family history, social history, health maintenance, notes from last encounter, lab results, imaging with the patient/caregiver  today.   Review of Systems  Constitutional: Negative.   HENT: Negative.   Eyes: Negative.   Respiratory: Negative.   Cardiovascular: Negative.   Gastrointestinal: Negative.   Endocrine: Negative.   Genitourinary: Negative.   Musculoskeletal: Negative.   Skin: Negative.   Allergic/Immunologic: Negative.   Neurological: Negative.   Hematological: Negative.   Psychiatric/Behavioral: Negative.   All other systems reviewed and are negative.    Objective:    Vitals:   01/18/19 1320  BP: 136/84  Pulse: 88  Resp: 16  Temp: 97.9 F (36.6 C)  SpO2: 98%    There is no height or weight on file to calculate BMI.  Physical Exam Vitals and nursing note reviewed.  Constitutional:      General: She is not in acute distress.    Appearance: She is not toxic-appearing or diaphoretic. Ill appearance: chronically ill appearing.     Comments: NAD, appears slightly older that stated age  HENT:     Mouth/Throat:     Mouth: Mucous membranes are dry.     Comments: severely dry mouth with matted brown tongue Poor dentition Eyes:     Pupils: Pupils are equal, round, and reactive to light.  Cardiovascular:     Rate and Rhythm: Normal rate and regular rhythm.     Heart sounds: No murmur. No friction rub. No gallop.   Musculoskeletal:     Right lower leg: Edema present.     Left lower leg: Edema present.     Comments: Left knee brace  Skin:    General: Skin is warm and dry.  Neurological:     Mental Status: She is alert.     Gait: Gait abnormal.     Comments: Good coordination and strength of b/l UE In wheelchair      Recent Results (from the past 2160 hour(s))  Comprehensive metabolic panel     Status: Abnormal   Collection Time: 11/01/18  1:50 PM  Result Value Ref Range   Sodium 139 135 - 145 mmol/L   Potassium 3.8 3.5 - 5.1 mmol/L   Chloride 104 98 - 111 mmol/L   CO2 25 22 - 32 mmol/L   Glucose, Bld 116 (H) 70 - 99 mg/dL   BUN 14 6 - 20 mg/dL   Creatinine, Ser 0.52 0.44  - 1.00 mg/dL   Calcium 9.1 8.9 - 10.3 mg/dL   Total Protein 7.3 6.5 - 8.1 g/dL   Albumin 3.9 3.5 - 5.0 g/dL   AST 23 15 - 41 U/L   ALT 20 0 - 44 U/L   Alkaline Phosphatase 92 38 - 126 U/L   Total Bilirubin 0.5 0.3 - 1.2 mg/dL   GFR calc non Af Amer >60 >60 mL/min   GFR calc Af Amer >60 >60 mL/min   Anion gap 10 5 - 15    Comment: Performed at [facility], [address] [place], [place] [address]  CBC with Differential/Platelet     Status: Abnormal   Collection Time: 11/01/18  1:50 PM  Result Value Ref Range   WBC 3.8 (L) 4.0 - 10.5 K/uL   RBC 3.91 3.87 - 5.11 MIL/uL   Hemoglobin 11.8 (L) 12.0 - 15.0 g/dL   HCT 35.2 (L) 36.0 - 46.0 %   MCV 90.0 80.0 - 100.0 fL   MCH 30.2 26.0 - 34.0 pg   MCHC 33.5 30.0 - 36.0 g/dL   RDW 12.5 11.5 - 15.5 %   Platelets 150 150 - 400  K/uL   nRBC 0.0 0.0 - 0.2 %   Neutrophils Relative % 45 %   Neutro Abs 1.8 1.7 - 7.7 K/uL   Lymphocytes Relative 33 %   Lymphs Abs 1.3 0.7 - 4.0 K/uL   Monocytes Relative 9 %   Monocytes Absolute 0.3 0.1 - 1.0 K/uL   Eosinophils Relative 11 %   Eosinophils Absolute 0.4 0.0 - 0.5 K/uL   Basophils Relative 1 %   Basophils Absolute 0.0 0.0 - 0.1 K/uL   Immature Granulocytes 1 %   Abs Immature Granulocytes 0.02 0.00 - 0.07 K/uL    Comment: Performed at [facility], [address] [place], [place] [address]  Cancer antigen 27.29     Status: None   Collection Time: 11/01/18  1:50 PM  Result Value Ref Range   CA 27.29 15.9 0.0 - 38.6 U/mL    Comment: (NOTE) Siemens Centaur Immunochemiluminometric Methodology (ICMA) Values obtained with different assay methods or kits cannot be used interchangeably. Results cannot be interpreted as absolute evidence of the presence or absence of malignant disease. Performed At: [facility] [place], [place] [id number] Rush Farmer MD [id number]   SARS CORONAVIRUS 2 (TAT 6-24 HRS) Nasopharyngeal Nasopharyngeal Swab     Status: None    Collection Time: 11/23/18 10:10 AM   Specimen: Nasopharyngeal Swab  Result Value Ref Range   SARS Coronavirus 2 NEGATIVE NEGATIVE    Comment: (NOTE) SARS-CoV-2 target nucleic acids are NOT DETECTED. The SARS-CoV-2 RNA is generally detectable in upper and lower respiratory specimens during the acute phase of infection. Negative results do not preclude SARS-CoV-2 infection, do not rule out co-infections with other pathogens, and should not be used as the sole basis for treatment or other patient management decisions. Negative results must be combined with clinical observations, patient history, and epidemiological information. The expected result is Negative. Fact Sheet for Patients: SugarRoll.be Fact Sheet for Healthcare Providers: https://www.woods-mathews.com/ This test is not yet approved or cleared by the [place] FDA and  has been authorized for detection and/or diagnosis of SARS-CoV-2 by FDA under an Emergency Use Authorization (EUA). This EUA will remain  in effect (meaning this test can be used) for the duration of the COVID-19 declaration under Section 56 4(b)(1) of the Act, 21 U.S.C. section 360bbb-3(b)(1), unless the authorization is terminated or revoked sooner. Performed at [facility], [place] [address], [place], [place] [address]   CBC     Status: Abnormal   Collection Time: 11/23/18 10:10 AM  Result Value Ref Range   WBC 4.2 4.0 - 10.5 K/uL   RBC 4.17 3.87 - 5.11 MIL/uL   Hemoglobin 12.6 12.0 - 15.0 g/dL   HCT 38.0 36.0 - 46.0 %   MCV 91.1 80.0 - 100.0 fL   MCH 30.2 26.0 - 34.0 pg   MCHC 33.2 30.0 - 36.0 g/dL   RDW 12.8 11.5 - 15.5 %   Platelets 148 (L) 150 - 400 K/uL   nRBC 0.0 0.0 - 0.2 %    Comment: Performed at [facility], [address] [place], [place] [address]  Type and screen     Status: None   Collection Time: 11/23/18 10:10 AM  Result Value Ref Range   ABO/RH(D) O POS    Antibody  Screen NEG    Sample Expiration 12/07/2018,2359    Extend sample reason      NO TRANSFUSIONS OR PREGNANCY IN THE PAST 3 MONTHS Performed at [facility],  [place], [place] [address]   Pregnancy, urine POC     Status: None   Collection Time: 11/27/18  8:07 AM  Result Value Ref Range   Preg Test, Ur NEGATIVE NEGATIVE    Comment:        THE SENSITIVITY OF THIS METHODOLOGY IS >24 mIU/mL   ABO/Rh     Status: None   Collection Time: 11/27/18  8:19 AM  Result Value Ref Range   ABO/RH(D)      O POS Performed at [facility], [address], [place], [place] [address]   Urine culture     Status: Abnormal   Collection Time: 11/27/18  9:19 AM   Specimen: Urine, Random  Result Value Ref Range   Specimen Description      URINE, RANDOM Performed at [facility], [address], [place], [place] [address]    Special Requests      NONE Performed at [facility] [address] [place], [place] [address]    Culture 70,000 COLONIES/mL KLEBSIELLA PNEUMONIAE (A)    Report Status 11/29/2018 FINAL    Organism ID, Bacteria KLEBSIELLA PNEUMONIAE (A)       Susceptibility   Klebsiella pneumoniae - MIC*    AMPICILLIN >=32 RESISTANT Resistant     CEFAZOLIN <=4 SENSITIVE Sensitive     CEFTRIAXONE <=1 SENSITIVE Sensitive     CIPROFLOXACIN <=0.25 SENSITIVE Sensitive     GENTAMICIN <=1 SENSITIVE Sensitive     IMIPENEM <=0.25 SENSITIVE Sensitive     NITROFURANTOIN 64 INTERMEDIATE Intermediate     TRIMETH/SULFA <=20 SENSITIVE Sensitive     AMPICILLIN/SULBACTAM 8 SENSITIVE Sensitive     PIP/TAZO <=4 SENSITIVE Sensitive     Extended ESBL NEGATIVE Sensitive     * 70,000 COLONIES/mL KLEBSIELLA PNEUMONIAE  Surgical pathology     Status: None   Collection Time: 11/27/18  9:46 AM  Result Value Ref Range   SURGICAL PATHOLOGY      SURGICAL PATHOLOGY CASE: [id number] PATIENT: Holley Dexter Surgical Pathology Report     Specimen  Submitted: A. Endocervical curettings B. Endometrial curettings  Clinical History: N95.0 postmenopausal bleeding    DIAGNOSIS: A. ENDOCERVIX; CURETTAGE: - SUPERFICIAL FRAGMENTS OF BENIGN ENDO AND ECTOCERVICAL TISSUE. - PORTION OF BENIGN LOWER UTERINE SEGMENT. - NEGATIVE FOR ATYPICAL HYPERPLASIA/EIN, DYSPLASIA, AND MALIGNANCY.  B. ENDOMETRIUM; CURETTAGE: - FRAGMENTS OF BENIGN ENDOMETRIAL POLYP. - BACKGROUND OF WEAKLY PROLIFERATIVE ENDOMETRIUM. - NEGATIVE FOR ATYPICAL HYPERPLASIA/EIN AND MALIGNANCY.  GROSS DESCRIPTION: A. Labeled: [default value] Received: Endocervical curettings Tissue fragment(s): Multiple Size: 1.0 x 1.0 by less than 0.1 cm in aggregate Description: Tiny hemorrhagic soft tissue fragments admixed with mucus Entirely submitted in 1 cassette.  B. Labeled: Endometrial curettings Received: In formalin Tissue fragment(s): Multiple Size: A ggregate, 2.0 x 1.5 x 0.3 cm Description: Hemorrhagic soft tissue fragments Entirely submitted in 1 cassette.   Final Diagnosis performed by Allena Napoleon, MD.   Electronically signed 11/28/2018 12:16:16PM The electronic signature indicates that the named Attending Pathologist has evaluated the specimen Technical component performed at [facility], [address] [place], [place] [address] Lab: [phone] Dir: Rush Farmer, MD, MMM  Professional component performed at [facility], [facility], [place], [place], [place] [address] Lab: [phone] Dir: Dellia Nims. Rubinas, MD       PHQ2/9: Depression screen [facility] 2/9 01/18/2019 10/22/2018 07/16/2018 05/23/2018 04/18/2018  Decreased Interest 0 0 0 0 0  Down, Depressed, Hopeless 0 0 0 0 0  PHQ - 2 Score 0  0 0 0 0  Altered sleeping 0 0 0 - 0  Tired, decreased energy 0 0 0 - 0  Change in appetite 0 0 0 - 0  Feeling bad or failure about yourself  0 0 0 - 0  Trouble concentrating 0 0 0 - 0  Moving slowly or fidgety/restless 0 0 0 - 0  Suicidal thoughts 0 0 0 -  0  PHQ-9 Score 0 0 0 - 0  Difficult doing work/chores Not difficult at all Not difficult at all Not difficult at all - Not difficult at all  Some recent data might be hidden    phq 9 is negative, reviewed today  Fall Risk: Fall Risk  01/18/2019 11/19/2018 10/22/2018 07/16/2018 05/23/2018  Falls in the past year? 0 1 0 0 0  Number falls in past yr: 0 0 0 - -  Injury with Fall? 0 0 0 - -  Risk Factor Category  - - - - -  Risk for fall due to : - - - - Impaired balance/gait;Impaired mobility  Risk for fall due to: Comment - - - - MS      Functional Status Survey: Is the patient deaf or have difficulty hearing?: No Does the patient have difficulty seeing, even when wearing glasses/contacts?: No Does the patient have difficulty concentrating, remembering, or making decisions?: No Does the patient have difficulty walking or climbing stairs?: No Does the patient have difficulty dressing or bathing?: No Does the patient have difficulty doing errands alone such as visiting a doctor's office or shopping?: No    Assessment & Plan:      ICD-10-CM   1. Ambulatory dysfunction  R26.2 PR DME SUPPLY OR ACCESSORY, NOS    For home use only DME Walker wide    CANCELED: For home use only DME Walker wide   working with neuro, HH PT and OT  2. Abnormality of gait  R26.9 PR DME SUPPLY OR ACCESSORY, NOS    For home use only DME Walker wide    CANCELED: For home use only DME Walker wide   secondary to MS and seizures, working with neuro, HH PT and OT  3. Requires assistance with activities of daily living (ADL)  Z74.1 PR DME SUPPLY OR ACCESSORY, NOS    For home use only DME Walker wide    CANCELED: For home use only DME Walker wide   working with neuro, HH PT and OT  4. 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HCC)  G80.1  PR DME SUPPLY OR ACCESSORY, NOS    For home use only DME Walker wide    CANCELED: For home use only DME Walker wide   see above  6. Foul smelling urine  R82.90 Urinalysis, Routine w reflex microscopic    Urine Culture   testing Ua through sendout lab, no constitutional sx currently, will treat based on culture results, encouraged urology f/up with questions about foley  7. Complicated UTI (urinary tract infection)  N39.0 Urinalysis, Routine w reflex microscopic    Urine Culture  8. Neurogenic bladder  N31.9   9. Thrombocytopenia (HCC)  D69.6 CBC w/ Diff   recheck labs  10. Encounter for medication monitoring  Z51.81 CBC w/ Diff    CMP w GFR  Lipid Panel   labs for monitoring with her medications      Pt meet medical necessity for walker due to impaired gait, impaired mobility interfering with ADLs she is working with PT and neuro on braces for legs and ambulation, so she is safely working on this in her home with [place] Pt and Ot Will neeed walker for lifetime Face-to-face visit today   Return in about 6 months (around 07/19/2019) for Routine follow-up.   Delsa Grana, PA-C 01/18/19 1:34 PM

## 2019-01-19 LAB — URINALYSIS, ROUTINE W REFLEX MICROSCOPIC
Bilirubin Urine: NEGATIVE
Glucose, UA: NEGATIVE
Hgb urine dipstick: NEGATIVE
Hyaline Cast: NONE SEEN /LPF
Ketones, ur: NEGATIVE
Nitrite: POSITIVE — AB
Protein, ur: NEGATIVE
Specific Gravity, Urine: 1.007 (ref 1.001–1.03)
Squamous Epithelial / HPF: NONE SEEN /HPF (ref <=5)
pH: 5.5 (ref 5.0–8.0)

## 2019-01-19 LAB — CBC WITH DIFFERENTIAL/PLATELET
Absolute Monocytes: 300 cells/uL (ref 200–950)
Basophils Absolute: 19 cells/uL (ref 0–200)
Basophils Relative: 0.5 %
Eosinophils Absolute: 111 cells/uL (ref 15–500)
Eosinophils Relative: 3 %
HCT: 38.7 % (ref 35.0–45.0)
Hemoglobin: 12.9 g/dL (ref 11.7–15.5)
Lymphs Abs: 1147 cells/uL (ref 850–3900)
MCH: 30.3 pg (ref 27.0–33.0)
MCHC: 33.3 g/dL (ref 32.0–36.0)
MCV: 90.8 fL (ref 80.0–100.0)
MPV: 13.1 fL — ABNORMAL HIGH (ref 7.5–12.5)
Monocytes Relative: 8.1 %
Neutro Abs: 2124 cells/uL (ref 1500–7800)
Neutrophils Relative %: 57.4 %
Platelets: 147 10*3/uL (ref 140–400)
RBC: 4.26 10*6/uL (ref 3.80–5.10)
RDW: 12.1 % (ref 11.0–15.0)
Total Lymphocyte: 31 %
WBC: 3.7 10*3/uL — ABNORMAL LOW (ref 3.8–10.8)

## 2019-01-19 LAB — URINE CULTURE
MICRO NUMBER:: 1190196
SPECIMEN QUALITY:: ADEQUATE

## 2019-01-19 LAB — COMPLETE METABOLIC PANEL WITH GFR
AG Ratio: 1.3 (calc) (ref 1.0–2.5)
ALT: 26 U/L (ref 6–29)
AST: 24 U/L (ref 10–35)
Albumin: 4.3 g/dL (ref 3.6–5.1)
Alkaline phosphatase (APISO): 91 U/L (ref 37–153)
BUN: 16 mg/dL (ref 7–25)
CO2: 25 mmol/L (ref 20–32)
Calcium: 9.6 mg/dL (ref 8.6–10.4)
Chloride: 104 mmol/L (ref 98–110)
Creat: 0.59 mg/dL (ref 0.50–1.05)
GFR, Est African American: 117 mL/min/{1.73_m2} (ref >=60)
GFR, Est Non African American: 101 mL/min/{1.73_m2} (ref >=60)
Globulin: 3.4 g/dL (calc) (ref 1.9–3.7)
Glucose, Bld: 97 mg/dL (ref 65–99)
Potassium: 4.2 mmol/L (ref 3.5–5.3)
Sodium: 141 mmol/L (ref 135–146)
Total Bilirubin: 0.4 mg/dL (ref 0.2–1.2)
Total Protein: 7.7 g/dL (ref 6.1–8.1)

## 2019-01-19 LAB — LIPID PANEL
Cholesterol: 197 mg/dL (ref <200)
HDL: 62 mg/dL (ref >=50)
LDL Cholesterol (Calc): 113 mg/dL (calc) — ABNORMAL HIGH
Non-HDL Cholesterol (Calc): 135 mg/dL (calc) — ABNORMAL HIGH (ref <130)
Total CHOL/HDL Ratio: 3.2 (calc) (ref <5.0)
Triglycerides: 114 mg/dL (ref <150)

## 2019-01-24 ENCOUNTER — Telehealth: Payer: Self-pay | Admitting: Physician Assistant

## 2019-01-24 ENCOUNTER — Telehealth: Payer: Self-pay | Admitting: Family Medicine

## 2019-01-24 NOTE — Telephone Encounter (Signed)
Nurse advised to call her urologist

## 2019-01-24 NOTE — Telephone Encounter (Signed)
Pt is having a white discharge with foul order around her urethra where catheter goes in.  Pt denies any pain, itching or burning.

## 2019-01-24 NOTE — Telephone Encounter (Signed)
Amada Jupiter rn with [facility]  is calling pt has white discharge from her urethra where the catheter is and vivian would like to know if leisa would like her to get a sample / or swab to be tested. Pt urine was neg last week. If leisa would like a swab done does it need to wet or dry and what type of test she would like to be order. Adonis Huguenin is at the pt home now

## 2019-01-24 NOTE — Telephone Encounter (Signed)
Spoke to patient and she states her home health nurse has been cleaning the vaginal area and has noticed a white discharge at the urethra where the catheter is. She states there is not white discharge in the vaginal area at all. Patient states it does not bother her at all. They have been using Renicidin and there is not problems with the drainage of the catheter. The nurse states she has seen a green discharge in the lining of her brief. Appointment has been scheduled.

## 2019-01-28 NOTE — Progress Notes (Signed)
01/29/2019 2:32 PM   Rebecca [name] 10-Apr-1960 [id number]  Referring provider: Delsa Grana, PA-C [address] [place],  [place] [address]  Chief Complaint  Patient presents with  . Follow-up    HPI: Rebecca [name] is a 58 year old female with MS who has a chronic indwelling Foley who's caregivers wanted her evaluated for a white discharge coming from the urethra.  Neurogenic bladder With MS.  Has had indwelling Foley for several months changed monthly. RUS in 05/2018 noted a 6 mm right renal stone.    Rebecca [name] states that her caregiver was concerned that they were noting a white discharge at the urethral meatus where the Foley catheter is inserted.  They have not noted any white discharge from the vaginal area.  Rebecca [name] has not experienced any vaginal irritation, itching or burning.  Patient denies any gross hematuria, dysuria or suprapubic/flank pain.  Patient denies any fevers, chills, nausea or vomiting.   She states her caregiver have not given her a bath yet today as to not wash away any discharge that may be present for [place] to examine.  PMH: Past Medical History:  Diagnosis Date  . Aortic valve disease    Mild AS / AI - most recent Echo demonstrated tricuspid aortic valve.  . Bacterial endocarditis    History of .  Marland Kitchen Bilateral lower extremity edema    Noncardiac.  Chronic. LE Venous dopplers - negative for DVT.; Echocardiogram January 2016: Normal EF with normal wall motion and valve function. Only grade 1 diastolic dysfunction. EF 60-65%. Mild MR  . Breast cancer ([place]) 12-31-13   Right breast, 12:00, 1.5 cm, T1c,N0 invasive mammary carcinoma, triple negative. --> Rx with Chemo  . Cervical stenosis of spine   . Herpes zoster   . IBS (irritable bowel syndrome)   . Lymphedema    has legs wrapped at [facility]  . Multiple sclerosis ([place]) 2001   Walks from room to room @ home; but Wheelchair when going out.  Marland Kitchen Neuromuscular disorder ([place])    MS  . PONV  (postoperative nausea and vomiting)    Related to Fentanyl  . Seizures ([place])    Takes Keppra  . Syncope and collapse     Surgical History: Past Surgical History:  Procedure Laterality Date  . ANKLE SURGERY     Left  . ANKLE SURGERY    . ANTERIOR CERVICAL DECOMP/DISCECTOMY FUSION  11/17/2011   Procedure: ANTERIOR CERVICAL DECOMPRESSION/DISCECTOMY FUSION 2 LEVELS;  Surgeon: Erline Levine, MD;  Location: [place] [facility];  Service: Neurosurgery;  Laterality: N/A;  Cervical Five-Six Six-Seven Anterior cervical decompression/diskectomy/fusion  . BREAST BIOPSY Right 12-31-13   invasive mammary  . BREAST SURGERY Right 02/03/2014   Right simple mastectomy with sentinel node biopsy.  . CHOLECYSTECTOMY    . COLONOSCOPY  2014  . HYSTEROSCOPY WITH D & C N/A 11/27/2018   Procedure: DILATATION AND CURETTAGE /HYSTEROSCOPY;  Surgeon: Gae Dry, MD;  Location: [facility];  Service: Gynecology;  Laterality: N/A;  . Lower extremity venous Dopplers  Feb 27, 2013   No LE DVT  . MASTECTOMY Right 2015  . Port a cath insertion Right 01/19/2010  . PORT-A-CATH REMOVAL     right  . PORT-A-CATH REMOVAL Right 09/03/2013   Procedure: REMOVAL PORT-A-CATH;  Surgeon: Conrad B[place], MD;  Location: [facility];  Service: Vascular;  Laterality: Right;  . TONSILLECTOMY    . TRANSTHORACIC ECHOCARDIOGRAM  03/2013; 02/2014   a) Normal LV size  and function with EF 60-65%.; Cannot exclude bicuspid aortic valve with mild AS and mild AI.; b) Normal EF with normal wall motion and valve function x Mild MR. G2 DD. EF 60-65%. Tricuspid AoV  . UPPER GI ENDOSCOPY  2014    Home Medications:  Allergies as of 01/29/2019      Reactions   Fentanyl Nausea And Vomiting, Nausea Only   vomiting Was given in PACU x3 each time patient got sick vomiting Was given in PACU x3 each time patient got sick   Sulfa Antibiotics Hives, Other (See Comments)   Light headed, over heated      Medication List       Accurate as of January 29, 2019  11:59 PM. If you have any questions, ask your nurse or doctor.        amantadine 100 MG capsule Commonly known as: SYMMETREL Take 1 capsule (100 mg total) by mouth 3 (three) times daily.   Assurance Vinyl Exam Gloves Misc Neurogenic bladder; MS; LON 99 months; for use for personal hygiene   baclofen 10 MG tablet Commonly known as: LIORESAL TAKE 1 TO 2 TABLETS BY MOUTH 3 TIMES A DAY What changed: See the new instructions.   COSAMIN DS PO Take 1 tablet by mouth 2 (two) times daily.   Hair Skin and Nails Formula Tabs Take 1 tablet by mouth 2 (two) times daily.   PreserVision AREDS 2 Caps Take 1 capsule by mouth 2 (two) times daily.   ibuprofen 600 MG tablet Commonly known as: ADVIL TAKE 1 TABLET (600 MG TOTAL) BY MOUTH EVERY 8 (EIGHT) HOURS AS NEEDED. What changed: reasons to take this   Interferon Beta-1a 44 MCG/0.5ML Soaj Commonly known as: Rebif Rebidose Inject 0.5 mLs into the skin every Monday, Wednesday, and Friday. What changed: how much to take   Leg Vein & Circulation Tabs Take 1 tablet by mouth 2 (two) times daily.   levETIRAcetam 500 MG tablet Commonly known as: KEPPRA Take 1 tablet (500 mg total) by mouth 2 (two) times daily.   loratadine 10 MG tablet Commonly known as: CLARITIN Take 10 mg by mouth daily.   MULTIVITAMIN PO Take 1 tablet by mouth daily.   oxybutynin 10 MG 24 hr tablet Commonly known as: DITROPAN-XL Take 1 tablet (10 mg total) by mouth 2 (two) times daily.   tiZANidine 4 MG tablet Commonly known as: ZANAFLEX TAKE 1/2 TABLETS (2 MG TOTAL) BY MOUTH 2 (TWO) TIMES DAILY AS NEEDED (SPASTICITY UNCONTROLLED). What changed: See the new instructions.   Turmeric 500 MG Caps Take 500 mg by mouth daily.       Allergies:  Allergies  Allergen Reactions  . Fentanyl Nausea And Vomiting and Nausea Only    vomiting Was given in PACU x3 each time patient got sick vomiting Was given in PACU x3 each time patient got sick  . Sulfa Antibiotics  Hives and Other (See Comments)    Light headed, over heated    Family History: Family History  Problem Relation Age of Onset  . Cancer Father        skin  . Heart disease Father   . Heart attack Father        heart attack in his 54's  . Thyroid disease Sister   . Ovarian cancer Cousin   . Breast cancer Maternal Aunt 60  . Breast cancer Maternal Grandmother 61  . Bladder Cancer Neg Hx   . Kidney cancer Neg Hx     Social  History:  reports that she has never smoked. She has never used smokeless tobacco. She reports that she does not drink alcohol or use drugs.  ROS: UROLOGY Frequent Urination?: No Hard to postpone urination?: Yes Burning/pain with urination?: No Get up at night to urinate?: No Leakage of urine?: No Urine stream starts and stops?: Yes Trouble starting stream?: No Do you have to strain to urinate?: No Blood in urine?: No Urinary tract infection?: No Sexually transmitted disease?: No Injury to kidneys or bladder?: No Painful intercourse?: No Weak stream?: No Currently pregnant?: No Vaginal bleeding?: No Last menstrual period?: n  Gastrointestinal Nausea?: No Vomiting?: No Indigestion/heartburn?: No Diarrhea?: No Constipation?: No  Constitutional Fever: No Night sweats?: No Weight loss?: No Fatigue?: No  Skin Skin rash/lesions?: No Itching?: No  Eyes Blurred vision?: No Double vision?: No  Ears/Nose/Throat Sore throat?: No Sinus problems?: No  Hematologic/Lymphatic Swollen glands?: No Easy bruising?: No  Cardiovascular Leg swelling?: Yes Chest pain?: No  Respiratory Cough?: No Shortness of breath?: No  Endocrine Excessive thirst?: No  Musculoskeletal Back pain?: No Joint pain?: No  Neurological Headaches?: No Dizziness?: No  Psychologic Depression?: No Anxiety?: No  Physical Exam: BP 126/82   Pulse 76   Ht 5\' 7"  (1.702 m)   BMI 34.46 kg/m   Constitutional:  Well nourished. Alert and oriented, No acute  distress. HEENT: [place] AT, moist mucus membranes.  Trachea midline, no masses. Cardiovascular: No clubbing, cyanosis, or edema. Respiratory: Normal respiratory effort, no increased work of breathing. GI: Abdomen is soft, non tender, non distended, no abdominal masses. Liver and spleen not palpable.  No hernias appreciated.  Stool sample for occult testing is not indicated.   GU: No CVA tenderness.  No bladder fullness or masses.  Normal external genitalia, normal pubic hair distribution, no lesions.  Foley in place.  No white discharge is seen, no lesions and no prolapse.  No urethral masses, tenderness and/or tenderness.   No bladder fullness, tenderness or masses. Normal vagina mucosa, fair estrogen effect, no discharge, no lesions, good pelvic support, no cystocele and no rectocele noted.   Anus and perineum are without rashes or lesions.    Skin: No rashes, bruises or suspicious lesions. Lymph: Bilateral lymphedema.   Neurologic: Has MS.  Confined to wheelchair.   Psychiatric: Normal mood and affect.   Laboratory Data: Lab Results  Component Value Date   WBC 3.7 (L) 01/18/2019   HGB 12.9 01/18/2019   HCT 38.7 01/18/2019   MCV 90.8 01/18/2019   PLT 147 01/18/2019    Lab Results  Component Value Date   CREATININE 0.59 01/18/2019    No results found for: PSA  No results found for: TESTOSTERONE  No results found for: HGBA1C  Lab Results  Component Value Date   TSH 1.83 11/25/2015       Component Value Date/Time   CHOL 197 01/18/2019 0000   HDL 62 01/18/2019 0000   CHOLHDL 3.2 01/18/2019 0000   VLDL 22 11/25/2015 1216   LDLCALC 113 (H) 01/18/2019 0000    Lab Results  Component Value Date   AST 24 01/18/2019   Lab Results  Component Value Date   ALT 26 01/18/2019   No components found for: ALKALINEPHOPHATASE No components found for: BILIRUBINTOTAL  No results found for: ESTRADIOL  Urinalysis    Component Value Date/Time   COLORURINE YELLOW 01/18/2019 0000    APPEARANCEUR CLOUDY (A) 01/18/2019 0000   APPEARANCEUR Clear 04/03/2017 1409   LABSPEC 1.007 01/18/2019 0000  LABSPEC 1.014 02/28/2011 1534   PHURINE 5.5 01/18/2019 0000   GLUCOSEU NEGATIVE 01/18/2019 0000   GLUCOSEU Negative 02/28/2011 1534   HGBUR NEGATIVE 01/18/2019 0000   BILIRUBINUR NEGATIVE 09/07/2018 1448   BILIRUBINUR negative 07/16/2018 1453   BILIRUBINUR Negative 04/03/2017 1409   BILIRUBINUR Negative 02/28/2011 1534   KETONESUR NEGATIVE 01/18/2019 0000   PROTEINUR NEGATIVE 01/18/2019 0000   UROBILINOGEN negative (A) 07/16/2018 1453   NITRITE POSITIVE (A) 01/18/2019 0000   LEUKOCYTESUR 3+ (A) 01/18/2019 0000   LEUKOCYTESUR Trace 02/28/2011 1534    I have reviewed the labs.  Pertinent Imaging: CLINICAL DATA:  Recurrent urinary tract infection  EXAM: RENAL / URINARY TRACT ULTRASOUND COMPLETE  COMPARISON:  None.  FINDINGS: Right Kidney:  Renal measurements: 11.2 x 5.4 x 4.5 cm = volume: 143 mL. 6 mm nonobstructive calculus is noted in lower pole collecting system. Echogenicity within normal limits. No mass or hydronephrosis visualized.  Left Kidney:  Renal measurements: 10.9 x 5.6 x 5.3 cm = volume: 169 mL. Echogenicity within normal limits. No mass or hydronephrosis visualized.  Bladder:  Decompressed secondary to Foley catheter.  IMPRESSION: Small nonobstructive right renal calculus. No hydronephrosis or renal obstruction is noted. No other renal abnormality is noted.   Electronically Signed   By: Marijo Conception M.D.   On: 06/01/2018 11:55    Assessment & Plan:    1. Urinary retention due to neurogenic bladder Managed with indwelling Foley changed every 30 days Has appointment on 02/07/2019 for exchange  2. Urethral discharge I did not visualize any discharge from the urethra or did I elicit any discharge during the exam either from the Foley catheter or the vaginal introitus The discharge the caregiver visualized may be a yeast  or mucus from catheter irritation As Ms. [name] has no symptoms at this visit we will just continue to monitor   Return for keep follow up with Sam on 02/07/2019.  These notes generated with voice recognition software. I apologize for typographical errors.  Zara Council, PA-C  [facility] [address]  [place], [place] [address] [phone]

## 2019-01-29 ENCOUNTER — Ambulatory Visit (INDEPENDENT_AMBULATORY_CARE_PROVIDER_SITE_OTHER): Payer: Medicare Other | Admitting: Urology

## 2019-01-29 ENCOUNTER — Encounter: Payer: Self-pay | Admitting: Urology

## 2019-01-29 ENCOUNTER — Other Ambulatory Visit: Payer: Self-pay

## 2019-01-29 VITALS — BP 126/82 | HR 76 | Ht 67.0 in

## 2019-01-29 DIAGNOSIS — T839XXA Unspecified complication of genitourinary prosthetic device, implant and graft, initial encounter: Secondary | ICD-10-CM | POA: Diagnosis not present

## 2019-02-04 ENCOUNTER — Other Ambulatory Visit: Payer: Self-pay

## 2019-02-05 ENCOUNTER — Inpatient Hospital Stay: Payer: Medicare Other

## 2019-02-07 ENCOUNTER — Other Ambulatory Visit: Payer: Self-pay

## 2019-02-07 ENCOUNTER — Encounter: Payer: Self-pay | Admitting: Physician Assistant

## 2019-02-07 ENCOUNTER — Ambulatory Visit (INDEPENDENT_AMBULATORY_CARE_PROVIDER_SITE_OTHER): Payer: Medicare Other | Admitting: Physician Assistant

## 2019-02-07 VITALS — BP 130/70 | HR 74 | Ht 67.0 in | Wt 220.0 lb

## 2019-02-07 DIAGNOSIS — Z466 Encounter for fitting and adjustment of urinary device: Secondary | ICD-10-CM

## 2019-02-07 NOTE — Progress Notes (Signed)
Cath Change/ Replacement  Patient is present today for a catheter change due to urinary retention.  44ml of water was removed from the balloon, a 16FR foley cath was removed with out difficulty.  Patient was cleaned and prepped in a sterile fashion with betadine and 2% lidocaine jelly was instilled into the urethra. A 16 FR foley cath was replaced into the bladder no complications were noted Urine return was noted <52ml and urine was yellow in color. The balloon was filled with 82ml of sterile water. Catheter was attached to patient's leg bag and secured with a StatLock to her anterior left thigh. An additional StatLock and two leg bags were also given to the patient and patient was given instruction on how to change from one bag to another. Patient was given proper instruction on catheter care.    Performed by: Debroah Loop, PA-C, Gaspar Cola, CMA, and Elberta Leatherwood, CMA  Follow up: Return in about 4 weeks (around 03/07/2019) for Catheter exchange.

## 2019-02-13 ENCOUNTER — Ambulatory Visit: Payer: Self-pay | Admitting: *Deleted

## 2019-02-13 ENCOUNTER — Telehealth: Payer: Self-pay | Admitting: *Deleted

## 2019-02-13 NOTE — Telephone Encounter (Signed)
I recommend going to urgent care or emergency room for any concerning signs or symptoms with elevated blood pressure.  If she is not having symptoms recommend pushing fluids and we will check on her on Friday

## 2019-02-13 NOTE — Telephone Encounter (Signed)
Pt is asymptomatic and has an appt friday

## 2019-02-13 NOTE — Telephone Encounter (Addendum)
Started rebif PA on CMM, key: [id number]. CMM can't recognize patient with information provided. Called patient who stated she has new  [place], medicare Member id [id number], group # [id number]. Tried to complete on CMM, but still states patient not found.  Called [employer] was given [phone], [facility] rx pa #, spoke with Larene Beach to initiate Rebif PA. Clinical questions answered, PA will go to clinical team for review, ref [id number]. Decision in 72 hours, office receives decision by fax.

## 2019-02-13 NOTE — Telephone Encounter (Addendum)
  Regan Lemming, PT with [facility] called in.   Pt in back ground with her answering questions.  When Alesia checked her BP and pulse they are both elevated for this pt.    Pt is asymptomatic.   Pt does not have a history of hypertension and is not on any medications for same per Alesia. Pt has an indwelling catheter that has dark urine in it.   The catheter was changed by urology last week.  No fever. They had not done therapy when these VS were taken.   They were talking through the treatment plan.  Regan Lemming with [facility] can be reached at [phone].  I sent these notes to [place] office for further disposition high priority.  I called [facility] and spoke with Melissa and let her know I sent these notes over high priority and gave a brief report of what was going on with the pt.   Reason for Disposition . [id number] Systolic BP  >= [id number] OR Diastolic >= 80 AND [id number] not taking BP medications  Answer Assessment - Initial Assessment Questions 1. BLOOD PRESSURE: "What is the blood pressure?" "Did you take at least two measurements 5 minutes apart?"     Regan Lemming, PT with home health.     Her BP is 160/100.  HR 90s at rest.  No fever.   Urine bag has dark urine in it.    Not doing therapy.   We are just talking through her vision.    No symptoms with high BP.    I took it 3 different times and it was 160s/100s.    Not on BP medication.   BP has not been an issue before.    She has an indwelling catheter.     It was changed last week. 2. ONSET: "When did you take your blood pressure?"     PT took BP about 30 minutes.    3. HOW: "How did you obtain the blood pressure?" (e.g., visiting nurse, automatic home BP monitor)     She has a cuff 4. HISTORY: "Do you have a history of high blood pressure?"     No 5. MEDICATIONS: "Are you taking any medications for blood pressure?" "Have you missed any doses recently?"     No 6. OTHER SYMPTOMS: "Do you have any symptoms?" (e.g.,  headache, chest pain, blurred vision, difficulty breathing, weakness)     No symptoms. 7. PREGNANCY: "Is there any chance you are pregnant?" "When was your last menstrual period?"     N/A  Protocols used: HIGH BLOOD PRESSURE-A-AH

## 2019-02-14 ENCOUNTER — Inpatient Hospital Stay: Payer: Medicare Other

## 2019-02-14 NOTE — Telephone Encounter (Addendum)
Received fax from [place] asking for additional information for Rebif PA. Questions answered,  PA # [id number]. Form faxed to [facility], f [phone].

## 2019-02-14 NOTE — Telephone Encounter (Signed)
Received fax from [employer] Rx: Rebif approved through 02/07/2020 under Medicare Part D.

## 2019-02-14 NOTE — Telephone Encounter (Signed)
Patient notified, tried to call PT but I do not beleive phone number correct

## 2019-02-15 ENCOUNTER — Inpatient Hospital Stay: Payer: Medicare Other

## 2019-02-18 ENCOUNTER — Telehealth: Payer: Self-pay | Admitting: Family Medicine

## 2019-02-18 NOTE — Telephone Encounter (Signed)
Yes please give Hh/wound care VO for wound care/pressure ulcer, dressing supplies Thanks Delsa Grana, PA-C

## 2019-02-18 NOTE — Telephone Encounter (Signed)
Tee with [facility] called asking verbals for wound care for pressure wound. Buttocks stage 2.  Please call [phone]

## 2019-02-19 ENCOUNTER — Other Ambulatory Visit: Payer: Self-pay

## 2019-02-19 NOTE — Telephone Encounter (Signed)
Left jessica verbal orders on VM

## 2019-02-20 ENCOUNTER — Inpatient Hospital Stay: Payer: Medicare Other | Attending: Oncology

## 2019-02-20 ENCOUNTER — Other Ambulatory Visit: Payer: Self-pay

## 2019-02-20 DIAGNOSIS — Z853 Personal history of malignant neoplasm of breast: Secondary | ICD-10-CM | POA: Insufficient documentation

## 2019-02-20 DIAGNOSIS — Z95828 Presence of other vascular implants and grafts: Secondary | ICD-10-CM

## 2019-02-20 DIAGNOSIS — Z452 Encounter for adjustment and management of vascular access device: Secondary | ICD-10-CM | POA: Diagnosis present

## 2019-02-20 MED ORDER — HEPARIN SOD (PORK) LOCK FLUSH 100 UNIT/ML IV SOLN
500.0000 [IU] | Freq: Once | INTRAVENOUS | Status: AC
  Administered 2019-02-20: 500 [IU] via INTRAVENOUS
  Filled 2019-02-20: qty 5

## 2019-02-20 MED ORDER — SODIUM CHLORIDE 0.9% FLUSH
10.0000 mL | Freq: Once | INTRAVENOUS | Status: AC
  Administered 2019-02-20: 10 mL via INTRAVENOUS
  Filled 2019-02-20: qty 10

## 2019-02-21 ENCOUNTER — Ambulatory Visit: Payer: Medicare Other | Admitting: Physical Medicine & Rehabilitation

## 2019-02-21 ENCOUNTER — Telehealth: Payer: Self-pay | Admitting: *Deleted

## 2019-02-21 ENCOUNTER — Other Ambulatory Visit: Payer: Self-pay | Admitting: *Deleted

## 2019-02-21 NOTE — Telephone Encounter (Signed)
Received refill request from [facility] for Rebif. Refill Rx competed, signed by Dr Leta Baptist and successfully faxed to [place], [phone].

## 2019-02-28 ENCOUNTER — Other Ambulatory Visit: Payer: Self-pay

## 2019-02-28 ENCOUNTER — Encounter: Payer: Self-pay | Admitting: Physical Medicine & Rehabilitation

## 2019-02-28 ENCOUNTER — Encounter: Payer: Medicare Other | Attending: Physical Medicine & Rehabilitation | Admitting: Physical Medicine & Rehabilitation

## 2019-02-28 VITALS — BP 144/82 | HR 67 | Temp 97.0°F | Ht 64.0 in | Wt 225.0 lb

## 2019-02-28 DIAGNOSIS — R269 Unspecified abnormalities of gait and mobility: Secondary | ICD-10-CM | POA: Diagnosis not present

## 2019-02-28 DIAGNOSIS — I890 Lymphedema, not elsewhere classified: Secondary | ICD-10-CM | POA: Insufficient documentation

## 2019-02-28 DIAGNOSIS — R200 Anesthesia of skin: Secondary | ICD-10-CM | POA: Insufficient documentation

## 2019-02-28 DIAGNOSIS — M62838 Other muscle spasm: Secondary | ICD-10-CM

## 2019-02-28 DIAGNOSIS — Z87440 Personal history of urinary (tract) infections: Secondary | ICD-10-CM | POA: Insufficient documentation

## 2019-02-28 DIAGNOSIS — G35 Multiple sclerosis: Secondary | ICD-10-CM | POA: Diagnosis not present

## 2019-02-28 DIAGNOSIS — N3289 Other specified disorders of bladder: Secondary | ICD-10-CM | POA: Insufficient documentation

## 2019-02-28 DIAGNOSIS — R202 Paresthesia of skin: Secondary | ICD-10-CM | POA: Insufficient documentation

## 2019-02-28 DIAGNOSIS — G8220 Paraplegia, unspecified: Secondary | ICD-10-CM

## 2019-02-28 DIAGNOSIS — G801 Spastic diplegic cerebral palsy: Secondary | ICD-10-CM

## 2019-02-28 DIAGNOSIS — N319 Neuromuscular dysfunction of bladder, unspecified: Secondary | ICD-10-CM | POA: Insufficient documentation

## 2019-02-28 NOTE — Progress Notes (Signed)
Subjective:    Patient ID: Rebecca [name], female    DOB: [birth year], 59 y.o.   MRN: [id number]  TELEHEALTH NOTE  Due to national recommendations of social distancing due to COVID 19, an audio/video telehealth visit is felt to be most appropriate for this patient at this time.  See Chart message from today for the patient's consent to telehealth from [place].     I verified that I am speaking with the correct person using two identifiers.  Location of patient: Home Location of provider: Office Method of communication: WebEx transition to telephone due to technical issues Names of participants : Zorita Pang scheduling, Jasmine December obtaining consent and vitals if available Established patient Time spent on call: 16 minutes   HPI  Female, history of chronic pain, lymphedema bilateral lower extremities, right breast cancer, chemotherapy, seizure disorder, multiple sclerosis presents for follow up for MS.  Last clinic visit 11/19/2018.  Since that time, patient followed up with urology at catheter change, notes reviewed.  She also followed up with neurology and is continues on Rebif - Notes reviewed.  Since that time, patient states she is in [facility] therapies. She was fitted for ?KAFO yesterday.  She is tolerating Baclofen and Tizanidine. Lymphedema has slightly improved.  Denies falls.  She is using walker for ambulation.  She notes leg pain in the evening for the last couple of days, but resolves with Tizanidine.   Pain Inventory Average Pain 8 Pain Right Now 0 My pain is sharp, stabbing, tingling and aching  In the last 24 hours, has pain interfered with the following? General activity 0 Relation with others 0 Enjoyment of life 0 What TIME of day is your pain at its worst? night Sleep (in general) Good  Pain is worse with: sitting and inactivity Pain improves with: rest and medication Relief from Meds: 8  Mobility walk with assistance use a  walker how many minutes can you walk? 15 ability to climb steps?  no do you drive?  no use a wheelchair transfers alone Do you have any goals in this area?  yes  Function disabled: date disabled . I need assistance with the following:  dressing, bathing, household duties and shopping Do you have any goals in this area?  yes  Neuro/Psych bladder control problems weakness numbness tingling trouble walking spasms  Prior Studies Any changes since last visit?  no    Physicians involved in your care Any changes since last visit?  no   Family History  Problem Relation Age of Onset  . Cancer Father        skin  . Heart disease Father   . Heart attack Father        heart attack in his 34's  . Thyroid disease Sister   . Ovarian cancer Cousin   . Breast cancer Maternal Aunt 60  . Breast cancer Maternal Grandmother 39  . Bladder Cancer Neg Hx   . Kidney cancer Neg Hx    Social History   Socioeconomic History  . Marital status: Married    Spouse name: don  . Number of children: 0  . Years of education: 48  . Highest education level: Not on file  Occupational History  . Occupation: disability  Tobacco Use  . Smoking status: Never Smoker  . Smokeless tobacco: Never Used  Substance and Sexual Activity  . Alcohol use: No  . Drug use: No  . Sexual activity: Not Currently  Partners: Male  Other Topics Concern  . Not on file  Social History Narrative   She is married. Recently moved back to [place] after being in [place] for some time. She is accompanied by her husband and aunt.   Never smoked. Never used alcohol.   Social Determinants of Health   Financial Resource Strain:   . Difficulty of Paying Living Expenses: Not on file  Food Insecurity:   . Worried About Charity fundraiser in the Last Year: Not on file  . Ran Out of Food in the Last Year: Not on file  Transportation Needs:   . Lack of Transportation (Medical): Not on file  . Lack of  Transportation (Non-Medical): Not on file  Physical Activity:   . Days of Exercise per Week: Not on file  . Minutes of Exercise per Session: Not on file  Stress:   . Feeling of Stress : Not on file  Social Connections:   . Frequency of Communication with Friends and Family: Not on file  . Frequency of Social Gatherings with Friends and Family: Not on file  . Attends Religious Services: Not on file  . Active Member of Clubs or Organizations: Not on file  . Attends Archivist Meetings: Not on file  . Marital Status: Not on file   Past Surgical History:  Procedure Laterality Date  . ANKLE SURGERY     Left  . ANKLE SURGERY    . ANTERIOR CERVICAL DECOMP/DISCECTOMY FUSION  11/17/2011   Procedure: ANTERIOR CERVICAL DECOMPRESSION/DISCECTOMY FUSION 2 LEVELS;  Surgeon: Erline Levine, MD;  Location: [place] [facility];  Service: Neurosurgery;  Laterality: N/A;  Cervical Five-Six Six-Seven Anterior cervical decompression/diskectomy/fusion  . BREAST BIOPSY Right 12-31-13   invasive mammary  . BREAST SURGERY Right 02/03/2014   Right simple mastectomy with sentinel node biopsy.  . CHOLECYSTECTOMY    . COLONOSCOPY  2014  . HYSTEROSCOPY WITH D & C N/A 11/27/2018   Procedure: DILATATION AND CURETTAGE /HYSTEROSCOPY;  Surgeon: Gae Dry, MD;  Location: [facility] ORS;  Service: Gynecology;  Laterality: N/A;  . Lower extremity venous Dopplers  Feb 27, 2013   No LE DVT  . MASTECTOMY Right 2015  . Port a cath insertion Right 01/19/2010  . PORT-A-CATH REMOVAL     right  . PORT-A-CATH REMOVAL Right 09/03/2013   Procedure: REMOVAL PORT-A-CATH;  Surgeon: Conrad , MD;  Location: [place];  Service: Vascular;  Laterality: Right;  . TONSILLECTOMY    . TRANSTHORACIC ECHOCARDIOGRAM  03/2013; 02/2014   a) Normal LV size and function with EF 60-65%.; Cannot exclude bicuspid aortic valve with mild AS and mild AI.; b) Normal EF with normal wall motion and valve function x Mild MR. G2 DD. EF 60-65%. Tricuspid  AoV  . UPPER GI ENDOSCOPY  2014   Past Medical History:  Diagnosis Date  . Aortic valve disease    Mild AS / AI - most recent Echo demonstrated tricuspid aortic valve.  . Bacterial endocarditis    History of .  Marland Kitchen Bilateral lower extremity edema    Noncardiac.  Chronic. LE Venous dopplers - negative for DVT.; Echocardiogram January 2016: Normal EF with normal wall motion and valve function. Only grade 1 diastolic dysfunction. EF 60-65%. Mild MR  . Breast cancer ([place]) 12-31-13   Right breast, 12:00, 1.5 cm, T1c,N0 invasive mammary carcinoma, triple negative. --> Rx with Chemo  . Cervical stenosis of spine   . Herpes zoster   . IBS (irritable bowel  syndrome)   . Lymphedema    has legs [facility] at [facility]  . Multiple sclerosis ([place]) 2001   Walks from room to room @ home; but Wheelchair when going out.  Marland Kitchen Neuromuscular disorder ([place])    MS  . PONV (postoperative nausea and vomiting)    Related to Fentanyl  . Seizures ([place])    Takes Keppra  . Syncope and collapse    BP (!) 144/82 Comment: pt reported, virtual visit  Pulse 67 Comment: pt reported, virtual visit  Temp (!) 97 F (36.1 C) Comment: pt reported, virtual visit  Ht 5\' 4"  (1.626 m) Comment: pt reported, virtual visit  Wt 225 lb (102.1 kg) Comment: pt reported, virtual visit  BMI 38.62 kg/m   Opioid Risk Score:   Fall Risk Score:  `1  Depression screen PHQ 2/9  Depression screen [facility] 2/9 01/18/2019 10/22/2018 07/16/2018 05/23/2018 04/18/2018 12/26/2017 09/08/2017  Decreased Interest 0 0 0 0 0 0 0  Down, Depressed, Hopeless 0 0 0 0 0 0 0  PHQ - 2 Score 0 0 0 0 0 0 0  Altered sleeping 0 0 0 - 0 0 -  Tired, decreased energy 0 0 0 - 0 0 -  Change in appetite 0 0 0 - 0 0 -  Feeling bad or failure about yourself  0 0 0 - 0 0 -  Trouble concentrating 0 0 0 - 0 0 -  Moving slowly or fidgety/restless 0 0 0 - 0 0 -  Suicidal thoughts 0 0 0 - 0 0 -  PHQ-9 Score 0 0 0 - 0 0 -  Difficult doing work/chores Not difficult at all Not  difficult at all Not difficult at all - Not difficult at all Not difficult at all -  Some recent data might be hidden    Review of Systems  HENT: Negative.   Eyes: Negative.   Respiratory: Negative.   Cardiovascular: Positive for leg swelling.  Gastrointestinal: Negative.   Endocrine: Negative.   Genitourinary:       Has foley currently and has experienced UTIs since last seen  Musculoskeletal: Positive for arthralgias and gait problem.  Skin: Negative.   Allergic/Immunologic: Negative.   Neurological: Positive for weakness and numbness.       Tingling  Hematological: Negative.   Psychiatric/Behavioral: Negative.       Objective:   Physical Exam  Gen: NAD.  Respiratory: Unlabored. Neurological. Alert and oriented  Psych: Normal mood/behavior    Assessment & Plan:  59 year old right-handed female, history of chronic pain, lymphedema bilateral lower extremities, right breast cancer, chemotherapy, seizure disorder, multiple sclerosis presents for follow up for MS.   1. MS   Cont HEP, completed therapies  Cont meds   Cont follow up with Neurology  Recently fitted for ?KAFO  2.  Spastic paraparesis:   Cont ROM  Cont Baclofen  Cont tizanidine 2mg  BID PRN, now requiring less  Good benefit with combination  No need for Botox at this time, confirmed with therapies.  Leg pain, likely spasms, encouraged stretching and cont Tizanidine   3. Chronic lymphedema  Overall improving  4. Gait abnormality  Cont HEP  Cont walker/wheelchair for safety, less reliance on wheelchair   5. Neurogenic bladder with spastic bladder  With recurrent UTIs  Cont meds  Cont follow up with Urology - holding off on suprapubic cath, with indwelling foley  Patient would like to follow-up in 3 months.

## 2019-03-12 ENCOUNTER — Ambulatory Visit: Payer: Medicare Other | Admitting: Physician Assistant

## 2019-03-20 ENCOUNTER — Other Ambulatory Visit: Payer: Self-pay | Admitting: Physical Medicine & Rehabilitation

## 2019-03-22 ENCOUNTER — Encounter (INDEPENDENT_AMBULATORY_CARE_PROVIDER_SITE_OTHER): Payer: Self-pay

## 2019-03-26 ENCOUNTER — Ambulatory Visit: Payer: Medicare Other | Admitting: Physician Assistant

## 2019-04-02 ENCOUNTER — Other Ambulatory Visit: Payer: Self-pay

## 2019-04-02 ENCOUNTER — Inpatient Hospital Stay: Payer: Medicare Other | Attending: Oncology

## 2019-04-02 DIAGNOSIS — Z452 Encounter for adjustment and management of vascular access device: Secondary | ICD-10-CM | POA: Diagnosis present

## 2019-04-02 DIAGNOSIS — C50911 Malignant neoplasm of unspecified site of right female breast: Secondary | ICD-10-CM | POA: Insufficient documentation

## 2019-04-02 DIAGNOSIS — Z95828 Presence of other vascular implants and grafts: Secondary | ICD-10-CM

## 2019-04-02 MED ORDER — HEPARIN SOD (PORK) LOCK FLUSH 100 UNIT/ML IV SOLN
INTRAVENOUS | Status: AC
  Filled 2019-04-02: qty 5

## 2019-04-02 MED ORDER — SODIUM CHLORIDE 0.9% FLUSH
10.0000 mL | Freq: Once | INTRAVENOUS | Status: AC
  Administered 2019-04-02: 10 mL via INTRAVENOUS
  Filled 2019-04-02: qty 10

## 2019-04-02 MED ORDER — HEPARIN SOD (PORK) LOCK FLUSH 100 UNIT/ML IV SOLN
500.0000 [IU] | Freq: Once | INTRAVENOUS | Status: AC
  Administered 2019-04-02: 15:00:00 500 [IU] via INTRAVENOUS
  Filled 2019-04-02: qty 5

## 2019-04-08 ENCOUNTER — Other Ambulatory Visit: Payer: Self-pay | Admitting: Family Medicine

## 2019-04-08 NOTE — Telephone Encounter (Signed)
Requested medication (s) are due for refill today: yes  Requested medication (s) are on the active medication list: no  Last refill:  01/06/2019  Future visit scheduled: yes  Notes to clinic:  d/c'd 06/04/2018    Requested Prescriptions  Pending Prescriptions Disp Refills   fluticasone (FLONASE) 50 MCG/ACT nasal spray [Pharmacy Med Name: FLUTICASONE PROP 50 MCG SPRAY] 48 mL 3    Sig: SPRAY 2 SPRAYS INTO EACH NOSTRIL EVERY DAY      Ear, Nose, and Throat: Nasal Preparations - Corticosteroids Passed - 04/08/2019  8:09 AM      Passed - Valid encounter within last 12 months    Recent Outpatient Visits           2 months ago Ambulatory dysfunction   [facility] Delsa Grana, PA-C   5 months ago Requires assistance with activities of daily living (ADL)   [facility] [place], [place], PA-C   8 months ago Foul smelling urine   [place], NP   11 months ago Partial symptomatic epilepsy with complex partial seizures, not intractable, without status epilepticus [facility])   [facility] [place], Satira Anis, MD   1 year ago [place], NP       Future Appointments             In 3 months Delsa Grana, PA-C [facility], [facility]

## 2019-04-10 ENCOUNTER — Ambulatory Visit: Payer: Self-pay | Admitting: *Deleted

## 2019-04-10 NOTE — Telephone Encounter (Signed)
Patient was using potty chair- her catheter came out. Patient has contacted medical service provider - home nurse- but does not know when she is going to come. Advised patient she can call her urologist and they should be able to replace the catheter at an appointment in the office- she is going to reach out to them. Patient advised to call back if she has trouble getting appointment.  Reason for Disposition . Catheter was accidentally pulled-out  Answer Assessment - Initial Assessment Questions 1. SYMPTOMS: "What symptoms are you concerned about?"     Patient accidentally pulled catheter out 2. ONSET:  "When did the symptoms start?"      Last night- when using toilet chair 3. FEVER: "Is there a fever?" If so, ask: "What is the temperature, how was it measured, and when did it start?"     n/a 4. ABDOMINAL PAIN: "Is there any abdominal pain?" (e.g., Scale 1-10; or mild, moderate, severe)     n/a  Protocols used: URINARY CATHETER SYMPTOMS AND QUESTIONS-A-AH

## 2019-04-22 ENCOUNTER — Ambulatory Visit: Payer: Medicare Other | Admitting: Physician Assistant

## 2019-04-26 ENCOUNTER — Telehealth: Payer: Self-pay | Admitting: Family Medicine

## 2019-04-26 NOTE — Progress Notes (Signed)
  [place]  [phone] Fax:(336) [phone]  ID: Rebecca [name] OB: 1960-02-16  MR#: [id number]  [id number]  Patient Care Team: Delsa Grana, PA-C as PCP - General (Family Medicine) Bobetta Lime, MD as Referring Physician Byrnett, Forest Gleason, MD (General Surgery) Penni Bombard, MD as Consulting Physician (Neurology) Lloyd Huger, MD as Consulting Physician (Oncology) Leonie Man, MD as Consulting Physician (Cardiology) Minna Merritts, MD as Consulting Physician (Cardiology) Laneta Simmers as Physician Assistant (Urology) Cathi Roan, [facility] (Pharmacist)    Lloyd Huger, MD   04/26/2019 4:16 PM     This encounter was created in error - please disregard.

## 2019-04-26 NOTE — Telephone Encounter (Signed)
Rebecca [name] is calling and would like wound care order to  clean wound with saline and pat dry and apply silver calcium alginate dressing to wound and then cover with border form dressing. The wound is on  left lower buttock.. The nurse would like to increase wound visit to twice a wk for 2 wks and will then taper back to once a week

## 2019-04-26 NOTE — Telephone Encounter (Signed)
Verbal given 

## 2019-04-26 NOTE — Telephone Encounter (Signed)
Can you please contact [place] and give VO for wound care and change in visits to 2x a week

## 2019-04-29 ENCOUNTER — Ambulatory Visit: Payer: Medicare Other | Admitting: Family Medicine

## 2019-04-30 ENCOUNTER — Ambulatory Visit: Payer: Medicare Other | Admitting: Family Medicine

## 2019-05-02 ENCOUNTER — Other Ambulatory Visit: Payer: Self-pay

## 2019-05-02 ENCOUNTER — Encounter: Payer: Self-pay | Admitting: Oncology

## 2019-05-03 ENCOUNTER — Inpatient Hospital Stay: Payer: Medicare Other | Admitting: Oncology

## 2019-05-03 ENCOUNTER — Inpatient Hospital Stay: Payer: Medicare Other

## 2019-05-09 NOTE — Progress Notes (Deleted)
Rebecca [name]  [phone] Fax:(336) [phone]  ID: [name] OB: [birth year]  MR#: [id number]  TDH#:[id number]  Patient Care Team: Delsa Grana, PA-C as PCP - General (Family Medicine) Bobetta Lime, MD as Referring Physician Byrnett, Forest Gleason, MD (General Surgery) Penni Bombard, MD as Consulting Physician (Neurology) Lloyd Huger, MD as Consulting Physician (Oncology) Leonie Man, MD as Consulting Physician (Cardiology) Minna Merritts, MD as Consulting Physician (Cardiology) Laneta Simmers as Physician Assistant (Urology) Cathi Roan, [facility] (Pharmacist)  CHIEF COMPLAINT: 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 BRCA negative.  INTERVAL HISTORY: Patient returns to clinic today for routine 60-monthevaluation.  She continues to have a chronic Foley catheter.  She has had no progression of her MS.  She currently feels well and is at her baseline.  She does not complain of pain today. Her lower extremity edema is unchanged. She has no new neurologic complaints.  She denies any chest pain, shortness of breath, cough, or hemoptysis.  She denies any nausea, vomiting, constipation, or diarrhea.  She has no urinary complaints.  Patient offers no further specific complaints today.  REVIEW OF SYSTEMS:   Review of Systems  Constitutional: Negative for chills, fever and malaise/fatigue.  Respiratory: Negative for cough and shortness of breath.   Cardiovascular: Positive for leg swelling. Negative for chest pain.  Gastrointestinal: Negative for abdominal pain, constipation, diarrhea, nausea and vomiting.  Genitourinary: Negative.  Negative for dysuria, frequency and urgency.  Musculoskeletal: Positive for back pain.  Skin: Negative.  Negative for rash.  Neurological: Positive for focal weakness. Negative for sensory change, weakness and headaches.  Psychiatric/Behavioral: Negative.  The patient is not  nervous/anxious.     As per HPI. Otherwise, a complete review of systems is negative.  PAST MEDICAL HISTORY: Past Medical History:  Diagnosis Date  . Aortic valve disease    Mild AS / AI - most recent Echo demonstrated tricuspid aortic valve.  . Bacterial endocarditis    History of .  .Marland KitchenBilateral lower extremity edema    Noncardiac.  Chronic. LE Venous dopplers - negative for DVT.; Echocardiogram January 2016: Normal EF with normal wall motion and valve function. Only grade 1 diastolic dysfunction. EF 60-65%. Mild MR  . [place]   Right breast, 12:00, 1.5 cm, T1c,N0 invasive mammary carcinoma, triple negative. --> Rx with Chemo  . Cervical stenosis of spine   . Herpes zoster   . IBS (irritable bowel syndrome)   . Lymphedema    [facility] [place]   Walks from room to room @ home; but Wheelchair when going out.  .[place]romuscular disorder [place]    MS  . PONV (postoperative nausea and vomiting)    Related to Fentanyl  . [place]    Takes Keppra  . Syncope and collapse     PAST SURGICAL HISTORY: Past Surgical History:  Procedure Laterality Date  . ANKLE SURGERY     Left  . ANKLE SURGERY    . ANTERIOR CERVICAL DECOMP/DISCECTOMY FUSION  11/17/2011   Procedure: ANTERIOR CERVICAL DECOMPRESSION/DISCECTOMY FUSION 2 LEVELS;  Surgeon: JErline Levine MD;  [facility];  Service: Neurosurgery;  Laterality: N/A;  Cervical Five-Six Six-Seven Anterior cervical decompression/diskectomy/fusion  . BREAST BIOPSY Right 12-31-13   invasive mammary  . BREAST SURGERY Right 02/03/2014   Right simple mastectomy with sentinel node biopsy.  . CHOLECYSTECTOMY    . COLONOSCOPY  2014  . HYSTEROSCOPY WITH D & C N/A 11/27/2018   Procedure: DILATATION AND CURETTAGE /HYSTEROSCOPY;  Surgeon: Gae Dry, MD;  Location: [facility] ORS;  Service: Gynecology;  Laterality: N/A;  . Lower extremity venous Dopplers  Feb 27, 2013   No LE DVT    . MASTECTOMY Right 2015  . Port a cath insertion Right 01/19/2010  . PORT-A-CATH REMOVAL     right  . PORT-A-CATH REMOVAL Right 09/03/2013   Procedure: REMOVAL PORT-A-CATH;  Surgeon: Conrad Delcambre, MD;  Location: [place];  Service: Vascular;  Laterality: Right;  . TONSILLECTOMY    . TRANSTHORACIC ECHOCARDIOGRAM  03/2013; 02/2014   a) Normal LV size and function with EF 60-65%.; Cannot exclude bicuspid aortic valve with mild AS and mild AI.; b) Normal EF with normal wall motion and valve function x Mild MR. G2 DD. EF 60-65%. Tricuspid AoV  . UPPER GI ENDOSCOPY  2014    FAMILY HISTORY Family History  Problem Relation Age of Onset  . Cancer Father        skin  . Heart disease Father   . Heart attack Father        heart attack in his 27's  . Thyroid disease Sister   . Ovarian cancer Cousin   . Breast cancer Maternal Aunt 60  . Breast cancer Maternal Grandmother 65  . Bladder Cancer Neg Hx   . Kidney cancer Neg Hx        ADVANCED DIRECTIVES:    HEALTH MAINTENANCE: Social History   Tobacco Use  . Smoking status: Never Smoker  . Smokeless tobacco: Never Used  Substance Use Topics  . Alcohol use: No  . Drug use: No     Colonoscopy:  PAP:  Bone density:  Lipid panel:  Allergies  Allergen Reactions  . Fentanyl Nausea And Vomiting and Nausea Only    vomiting Was given in PACU x3 each time patient got sick vomiting Was given in PACU x3 each time patient got sick  . Sulfa Antibiotics Hives and Other (See Comments)    Light headed, over heated    Current Outpatient Medications  Medication Sig Dispense Refill  . amantadine (SYMMETREL) 100 MG capsule Take 1 capsule (100 mg total) by mouth 3 (three) times daily. 270 capsule 4  . baclofen (LIORESAL) 10 MG tablet TAKE 1 TO 2 TABLETS BY MOUTH 3 TIMES A DAY (Patient taking differently: Take 10-20 mg by mouth See admin instructions. Take 2 tablets (20 mg) by mouth in the morning, 1 tablet (10 mg) by mouth at lunch, & 2 tablets (20  mg) by mouth at dinner.) 540 tablet 2  . Disposable Gloves (ASSURANCE VINYL EXAM GLOVES) MISC Neurogenic bladder; MS; LON 99 months; for use for personal hygiene 50 each 11  . fluticasone (FLONASE) 50 MCG/ACT nasal spray SPRAY 2 SPRAYS INTO EACH NOSTRIL EVERY DAY 48 mL 3  . Glucosamine-Chondroitin (COSAMIN DS PO) Take 1 tablet by mouth 2 (two) times daily.    Marland Kitchen ibuprofen (ADVIL) 600 MG tablet TAKE 1 TABLET (600 MG TOTAL) BY MOUTH EVERY 8 (EIGHT) HOURS AS NEEDED. (Patient taking differently: Take 600 mg by mouth every 8 (eight) hours as needed (pain.). ) 90 tablet 3  . Interferon Beta-1a (REBIF REBIDOSE) 44 MCG/0.5ML SOAJ Inject 0.5 mLs into the skin every Monday, Wednesday, and Friday. (Patient taking differently: Inject 44 mcg into the skin every Monday, Wednesday, and Friday. ) 12 Syringe 11  . levETIRAcetam (KEPPRA) 500 MG tablet Take 1 tablet (  500 mg total) by mouth 2 (two) times daily. 180 tablet 4  . loratadine (CLARITIN) 10 MG tablet Take 10 mg by mouth daily.    . Misc Natural Products (LEG VEIN & CIRCULATION) TABS Take 1 tablet by mouth 2 (two) times daily.     . Multiple Vitamins-Minerals (HAIR SKIN AND NAILS FORMULA) TABS Take 1 tablet by mouth 2 (two) times daily.     . Multiple Vitamins-Minerals (MULTIVITAMIN PO) Take 1 tablet by mouth daily.     Marland Kitchen oxybutynin (DITROPAN-XL) 10 MG 24 hr tablet Take 1 tablet (10 mg total) by mouth 2 (two) times daily. 180 tablet 4  . tiZANidine (ZANAFLEX) 4 MG tablet TAKE 1/2 TABLETS (2 MG TOTAL) BY MOUTH 2 (TWO) TIMES DAILY AS NEEDED (SPASTICITY UNCONTROLLED). 90 tablet 1  . Turmeric 500 MG CAPS Take 500 mg by mouth daily.     No current facility-administered medications for this visit.    OBJECTIVE: There were no vitals filed for this visit.   There is no height or weight on file to calculate BMI.    ECOG FS:2 - Symptomatic, <50% confined to bed  General: Well-developed, well-nourished, no acute distress.  Sitting in a wheelchair. Eyes: Pink  conjunctiva, anicteric sclera. HEENT: Normocephalic, moist mucous membranes. Lungs: Clear to auscultation bilaterally. Heart: Regular rate and rhythm. No rubs, murmurs, or gallops. Abdomen: Soft, nontender, nondistended. No organomegaly noted, normoactive bowel sounds. Musculoskeletal: Bilateral 2-3+ peripheral edema.  Foley catheter noted. Neuro: Alert, answering all questions appropriately. Cranial nerves grossly intact. Skin: No rashes or petechiae noted. Psych: Normal affect.  LAB RESULTS:  Lab Results  Component Value Date   NA 141 01/18/2019   K 4.2 01/18/2019   CL 104 01/18/2019   CO2 25 01/18/2019   GLUCOSE 97 01/18/2019   BUN 16 01/18/2019   CREATININE 0.59 01/18/2019   CALCIUM 9.6 01/18/2019   PROT 7.7 01/18/2019   ALBUMIN 3.9 11/01/2018   AST 24 01/18/2019   ALT 26 01/18/2019   ALKPHOS 92 11/01/2018   BILITOT 0.4 01/18/2019   GFRNONAA 101 01/18/2019   GFRAA 117 01/18/2019    Lab Results  Component Value Date   WBC 3.7 (L) 01/18/2019   NEUTROABS 2,124 01/18/2019   HGB 12.9 01/18/2019   HCT 38.7 01/18/2019   MCV 90.8 01/18/2019   PLT 147 01/18/2019      STUDIES: No results found.  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 BRCA negative.  PLAN:    1. Triple negative right breast cancer: No evidence of disease. Given her difficulties with Taxol and multiple sclerosis, treatment was discontinued after a total of 6 of 12 cycles of Taxol. Her last chemotherapy was July 17, 2014.  Because patient had a full mastectomy, she did not require adjuvant XRT. Given the fact that she is a triple negative cancer, she does not require an aromatase inhibitor.  Patient's most recent left screening mammogram on September 06, 2018 was reported as BI-RADS 1.  Repeat in July 2021.  CA 27-29 continues to be within normal limits, today's result is pending.  Return to clinic in 6 months for routine evaluation at which point patient can likely be  transitioned to yearly visits.   2. Multiple sclerosis: Continue evaluation and current treatment per neurology. Her primary neurologist is Dr. Andrey Spearman.  phone [phone], fax [phone]. 3. Lymphedema: Chronic and unchanged.  Continue treatment and wraps as prescribed. 4. Peripheral neuropathy: Patient does not complain of this today. 5. Pain:  Patient is no longer taking narcotics. 6.  Thrombocytopenia: Resolved. 7.  Foley catheter: Patient states she was previously evaluated for a suprapubic catheter, but is unclear if this procedure is going to proceed or not.  Patient expressed understanding and was in agreement with this plan. She also understands that She can call clinic at any time with any questions, concerns, or complaints.   Breast cancer   Staging form: Breast, AJCC 7th Edition     Pathologic stage from 05/24/2014: Stage IA (T1c, N0, cM0) - Signed by Lloyd Huger, MD on 05/24/2014   Lloyd Huger, MD   05/09/2019 1:14 PM

## 2019-05-13 ENCOUNTER — Inpatient Hospital Stay: Payer: Medicare Other

## 2019-05-13 ENCOUNTER — Inpatient Hospital Stay: Payer: Medicare Other | Admitting: Oncology

## 2019-05-16 ENCOUNTER — Ambulatory Visit: Payer: Medicare Other

## 2019-05-16 ENCOUNTER — Ambulatory Visit: Payer: Medicare Other | Admitting: Family Medicine

## 2019-05-16 NOTE — Progress Notes (Deleted)
Patient ID: Rebecca [name], female    DOB: Mar 28, 1960, 59 y.o.   MRN: [id number]  PCP: Delsa Grana, PA-C  No chief complaint on file.   Subjective:   Rebecca [name] is a 59 y.o. female, presents to clinic with CC of the following:  HPI    Patient Active Problem List   Diagnosis Date Noted  . Muscle spasm 02/28/2019  . Post-menopausal bleeding 11/20/2018  . Ambulatory dysfunction 02/02/2018  . Abnormality of gait 11/17/2017  . 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 Bilateral lower extremity pain (primary) (bilateral) (right greater than left) 08/01/2016  . Chronic neck pain (secondary) (bilateral) ( left greater than right) 07/28/2016  . Long term current use of opiate analgesic 07/28/2016  . Long term prescription opiate use 07/28/2016  . Opiate use 07/28/2016  . Neutropenia ([place]) 06/27/2016  . Chronic pain syndrome 06/03/2016  . Adjustment disorder with mixed anxiety and depressed mood   . Neurogenic bladder   . Neurogenic bowel   . Detrusor and sphincter dyssynergia   . Urinary incontinence   . Seizure disorder ([place])   . Slow transit constipation   . 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[place])   . Lymphedema   . Hypoalbuminemia due to protein-calorie malnutrition ([place])   . Encounter for screening for cervical cancer  11/27/2015  . Preventative health care 11/25/2015  . History of breast cancer in female 10/28/2015  . Anemia 10/28/2015  . Thrombocytopenia ([place]) 10/28/2015  . Allergic rhinitis 10/28/2015  . IBS (irritable bowel syndrome) 10/28/2015  . 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 History of right mastectomy 10/24/2014  . Medicare annual wellness visit, subsequent 10/24/2014  . MS (multiple sclerosis) ([place]) 09/16/2014  . Hematoma complicating a procedure 03/06/2014  . Primary cancer of right female breast ([place]) 01/10/2014  . SOB (shortness of breath) 03/01/2013  . Bilateral lower extremity edema   . 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  amantadine (SYMMETREL) 100 MG capsule, Take 1 capsule (100 mg total) by mouth 3 (three) times daily., Disp: 270 capsule, Rfl: 4 .  baclofen (LIORESAL) 10 MG tablet, TAKE 1 TO 2 TABLETS BY MOUTH 3 TIMES A DAY (Patient taking differently: Take 10-20 mg by mouth See admin instructions. Take 2 tablets (20 mg) by mouth in the morning, 1 tablet (10 mg) by mouth at lunch, & 2 tablets (20 mg) by mouth at dinner.), Disp: 540 tablet, Rfl: 2 .  Disposable Gloves (ASSURANCE VINYL EXAM GLOVES) MISC, Neurogenic bladder; MS; LON 99 months; for use for personal hygiene, Disp: 50 each, Rfl: 11 .  fluticasone (FLONASE) 50 MCG/ACT nasal spray, SPRAY 2 SPRAYS INTO EACH NOSTRIL EVERY DAY, Disp: 48 mL, Rfl: 3 .  Glucosamine-Chondroitin (COSAMIN DS PO), Take 1 tablet by mouth 2 (two) times daily., Disp: , Rfl:  .  ibuprofen (ADVIL) 600 MG tablet, TAKE 1 TABLET (600 MG TOTAL) BY MOUTH EVERY 8 (EIGHT) HOURS AS NEEDED. (Patient taking differently: Take 600 mg by mouth every 8 (eight) hours as needed (pain.). ), Disp: 90 tablet, Rfl: 3 .  Interferon Beta-1a (REBIF REBIDOSE) 44 MCG/0.5ML SOAJ, Inject 0.5 mLs into the skin every Monday, Wednesday, and Friday. (Patient taking differently: Inject 44 mcg into the skin every Monday, Wednesday, and Friday. ), Disp: 12 Syringe, Rfl: 11 .  levETIRAcetam (KEPPRA) 500 MG tablet, Take 1 tablet (500 mg total) by mouth 2 (two) times daily., Disp: 180 tablet, Rfl: 4 .  loratadine (CLARITIN) 10 MG tablet, Take 10 mg by mouth daily., Disp: , Rfl:  .  Misc Natural Products (LEG VEIN & CIRCULATION) TABS, Take 1 tablet by mouth 2 (two) times daily. , Disp: , Rfl:  .  Multiple Vitamins-Minerals (HAIR SKIN AND NAILS FORMULA) TABS, Take 1 tablet by mouth 2 (two) times daily. , Disp: , Rfl:  .  Multiple Vitamins-Minerals (MULTIVITAMIN PO), Take 1 tablet by mouth daily. , Disp: , Rfl:  .  oxybutynin (DITROPAN-XL) 10 MG 24 hr tablet, Take 1 tablet  (10 mg total) by mouth 2 (two) times daily., Disp: 180 tablet, Rfl: 4 .  tiZANidine (ZANAFLEX) 4 MG tablet, TAKE 1/2 TABLETS (2 MG TOTAL) BY MOUTH 2 (TWO) TIMES DAILY AS NEEDED (SPASTICITY UNCONTROLLED)., Disp: 90 tablet, Rfl: 1 .  Turmeric 500 MG CAPS, Take 500 mg by mouth daily., Disp: , Rfl:    Allergies  Allergen Reactions  . Fentanyl Nausea And Vomiting and Nausea Only    vomiting Was given in PACU x3 each time patient got sick vomiting Was given in PACU x3 each time patient got sick  . Sulfa Antibiotics Hives and Other (See Comments)    Light headed, over heated     Family History  Problem Relation Age of Onset  . Cancer Father        skin  . Heart disease Father   . Heart attack Father        heart attack in his 83's  . Thyroid disease Sister   . Ovarian cancer Cousin   . Breast cancer Maternal Aunt 60  . Breast cancer Maternal Grandmother 58  . Bladder Cancer Neg Hx   . Kidney cancer Neg Hx      Social History   Socioeconomic History  . Marital status: Married    Spouse name: don  . Number of children: 0  . Years of education: 59  . Highest education level: Not on file  Occupational History  . Occupation: disability  Tobacco Use  . Smoking status: Never Smoker  . Smokeless tobacco: Never Used  Substance and Sexual Activity  . Alcohol use: No  . Drug use: No  . Sexual activity: Not Currently    Partners: Male  Other Topics Concern  . Not on file  Social History Narrative   She is married. Recently moved back to [place] after being in [place] for some time. She is accompanied by her husband and aunt.   Never smoked. Never used alcohol.   Social Determinants of Health   Financial Resource Strain:   . Difficulty of Paying Living Expenses:   Food Insecurity:   . Worried About Charity fundraiser in the Last Year:   . Arboriculturist in the Last Year:   Transportation Needs:   . Film/video editor (Medical):   Marland Kitchen Lack of Transportation  (Non-Medical):   Physical Activity:   . Days of Exercise per Week:   . Minutes of Exercise per Session:   Stress:   . Feeling of Stress :   Social Connections:   . Frequency of Communication with Friends and Family:   . Frequency of Social Gatherings with Friends and Family:   . Attends Religious Services:   . Active Member of Clubs or Organizations:   . Attends Archivist Meetings:   Marland Kitchen Marital Status:   Intimate Partner Violence:   . Fear of Current or Ex-Partner:   . Emotionally Abused:   Marland Kitchen Physically  Abused:   . Sexually Abused:     Chart Review Today: ***  Review of Systems     Objective:   There were no vitals filed for this visit.  There is no height or weight on file to calculate BMI.  Physical Exam   Results for orders placed or performed in visit on 01/18/19  Urine Culture   Specimen: Urine  Result Value Ref Range   MICRO NUMBER: [id number]    SPECIMEN QUALITY: Adequate    Sample Source URINE    STATUS: FINAL    Result:      Growth of mixed flora was isolated, suggesting probable contamination. No further testing will be performed. If clinically indicated, recollection using a method to minimize contamination, with prompt transfer to Urine Culture Transport Tube, is  recommended.   Urinalysis, Routine w reflex microscopic  Result Value Ref Range   Color, Urine YELLOW YELLOW   APPearance CLOUDY (A) CLEAR   Specific Gravity, Urine 1.007 1.001 - 1.03   pH 5.5 5.0 - 8.0   Glucose, UA NEGATIVE NEGATIVE   Bilirubin Urine NEGATIVE NEGATIVE   Ketones, ur NEGATIVE NEGATIVE   Hgb urine dipstick NEGATIVE NEGATIVE   Protein, ur NEGATIVE NEGATIVE   Nitrite POSITIVE (A) NEGATIVE   Leukocytes,Ua 3+ (A) NEGATIVE   WBC, UA 20-40 (A) 0 - 5 /HPF   RBC / HPF 0-2 0 - 2 /HPF   Squamous Epithelial / LPF NONE SEEN < OR = 5 /HPF   Bacteria, UA MODERATE (A) NONE SEEN /HPF   Hyaline Cast NONE SEEN NONE SEEN /LPF  CBC w/ Diff  Result Value Ref Range   WBC 3.7 (L)  3.8 - 10.8 Thousand/uL   RBC 4.26 3.80 - 5.10 Million/uL   Hemoglobin 12.9 11.7 - 15.5 g/dL   HCT 38.7 35.0 - 45.0 %   MCV 90.8 80.0 - 100.0 fL   MCH 30.3 27.0 - 33.0 pg   MCHC 33.3 32.0 - 36.0 g/dL   RDW 12.1 11.0 - 15.0 %   Platelets 147 140 - 400 Thousand/uL   MPV 13.1 (H) 7.5 - 12.5 fL   Neutro Abs 2,124 1,500 - 7,800 cells/uL   Lymphs Abs 1,147 850 - 3,900 cells/uL   Absolute Monocytes 300 200 - 950 cells/uL   Eosinophils Absolute 111 15 - 500 cells/uL   Basophils Absolute 19 0 - 200 cells/uL   Neutrophils Relative % 57.4 %   Total Lymphocyte 31.0 %   Monocytes Relative 8.1 %   Eosinophils Relative 3.0 %   Basophils Relative 0.5 %  CMP w GFR  Result Value Ref Range   Glucose, Bld 97 65 - 99 mg/dL   BUN 16 7 - 25 mg/dL   Creat 0.59 0.50 - 1.05 mg/dL   GFR, Est Non African American 101 > OR = 60 mL/min/1.35m2   GFR, Est African American 117 > OR = 60 mL/min/1.38m2   BUN/Creatinine Ratio NOT APPLICABLE 6 - 22 (calc)   Sodium 141 135 - 146 mmol/L   Potassium 4.2 3.5 - 5.3 mmol/L   Chloride 104 98 - 110 mmol/L   CO2 25 20 - 32 mmol/L   Calcium 9.6 8.6 - 10.4 mg/dL   Total Protein 7.7 6.1 - 8.1 g/dL   Albumin 4.3 3.6 - 5.1 g/dL   Globulin 3.4 1.9 - 3.7 g/dL (calc)   AG Ratio 1.3 1.0 - 2.5 (calc)   Total Bilirubin 0.4 0.2 - 1.2 mg/dL   Alkaline phosphatase (APISO) 91  37 - 153 U/L   AST 24 10 - 35 U/L   ALT 26 6 - 29 U/L  Lipid Panel  Result Value Ref Range   Cholesterol 197 <200 mg/dL   HDL 62 > OR = 50 mg/dL   Triglycerides 114 <150 mg/dL   LDL Cholesterol (Calc) 113 (H) mg/dL (calc)   Total CHOL/HDL Ratio 3.2 <5.0 (calc)   Non-HDL Cholesterol (Calc) 135 (H) <130 mg/dL (calc)   *Note: Due to a large number of results and/or encounters for the requested time period, some results have not been displayed. A complete set of results can be found in Results Review.        Assessment & Plan:    No diagnosis found.      Delsa Grana, PA-C 05/16/19 1:07 AM

## 2019-05-22 ENCOUNTER — Emergency Department: Payer: Medicare Other

## 2019-05-22 ENCOUNTER — Emergency Department
Admission: EM | Admit: 2019-05-22 | Discharge: 2019-05-22 | Disposition: A | Discharge status: Home / Self Care | Payer: Medicare Other | Attending: Emergency Medicine | Admitting: Emergency Medicine

## 2019-05-22 ENCOUNTER — Other Ambulatory Visit: Payer: Self-pay

## 2019-05-22 DIAGNOSIS — I890 Lymphedema, not elsewhere classified: Secondary | ICD-10-CM | POA: Insufficient documentation

## 2019-05-22 DIAGNOSIS — M79605 Pain in left leg: Secondary | ICD-10-CM | POA: Diagnosis present

## 2019-05-22 DIAGNOSIS — Z79899 Other long term (current) drug therapy: Secondary | ICD-10-CM | POA: Diagnosis not present

## 2019-05-22 NOTE — ED Triage Notes (Signed)
Pt comes EMS for possible blood clot in LLE. See first nurse note. AOx4.

## 2019-05-22 NOTE — ED Provider Notes (Signed)
[facility] Emergency Department Provider Note  ____________________________________________   First MD Initiated Contact with Patient 05/22/19 1318     (approximate)  I have reviewed the triage vital signs and the nursing notes.   HISTORY  Chief Complaint Leg Pain    HPI Rebecca [name] is a 59 y.o. female with history lymphedema, multiple sclerosis, here with left leg pain and swelling.  The patient states that over the last several days, she has noticed increasing lower extremity edema and pain.  She had home health come work on her today and they noticed that her distal left lower leg was more swollen than usual and more painful.  She states she has not had any increased redness, drainage, fever, or infectious symptoms.  She did not necessarily notice the swelling until this was noted today.  Of note, she has a indwelling catheter that is strapped to her left leg.  She has been having difficulty getting a compression dressing on the left leg for the last several weeks after the bag was changed and a Velcro strap was placed on it.  No recent trauma.  No distal numbness or weakness.  No other acute complaints. No history of DVT or PE.       Past Medical History:  Diagnosis Date  . Aortic valve disease    Mild AS / AI - most recent Echo demonstrated tricuspid aortic valve.  . Bacterial endocarditis    History of .  Marland Kitchen Bilateral lower extremity edema    Noncardiac.  Chronic. LE Venous dopplers - negative for DVT.; Echocardiogram January 2016: Normal EF with normal wall motion and valve function. Only grade 1 diastolic dysfunction. EF 60-65%. Mild MR  . Breast cancer ([place]) 12-31-13   Right breast, 12:00, 1.5 cm, T1c,N0 invasive mammary carcinoma, triple negative. --> Rx with Chemo  . Cervical stenosis of spine   . Herpes zoster   . IBS (irritable bowel syndrome)   . Lymphedema    has legs wrapped at [facility]  . Multiple sclerosis ([place]) 2001   Walks from room  to room @ home; but Wheelchair when going out.  Marland Kitchen Neuromuscular disorder ([place])    MS  . PONV (postoperative nausea and vomiting)    Related to Fentanyl  . Seizures ([place])    Takes Keppra  . Syncope and collapse     Patient Active Problem List   Diagnosis Date Noted  . Muscle spasm 02/28/2019  . Post-menopausal bleeding 11/20/2018  . Ambulatory dysfunction 02/02/2018  . Abnormality of gait 11/17/2017  . 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 Bilateral lower extremity pain (primary) (bilateral) (right greater than left) 08/01/2016  . Chronic neck pain (secondary) (bilateral) ( left greater than right) 07/28/2016  . Long term current use of opiate analgesic 07/28/2016  . Long term prescription opiate use 07/28/2016  . Opiate use 07/28/2016  . Neutropenia ([place]) 06/27/2016  . Chronic pain syndrome 06/03/2016  . Adjustment disorder with mixed anxiety and depressed mood   . Neurogenic bladder   . Neurogenic bowel   . Detrusor and sphincter dyssynergia   . Urinary incontinence   . Seizure disorder ([place])   . Slow transit constipation   . 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[place])   . Lymphedema   . Hypoalbuminemia due to protein-calorie malnutrition ([place])   . Encounter for screening for cervical cancer  11/27/2015  . Preventative health care 11/25/2015  . History of breast cancer in female 10/28/2015  . Anemia  10/28/2015  . Thrombocytopenia ([place]) 10/28/2015  . Allergic rhinitis 10/28/2015  . IBS (irritable bowel syndrome) 10/28/2015  . 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 History of right mastectomy 10/24/2014  . Medicare annual wellness visit, subsequent 10/24/2014  . MS (multiple sclerosis) ([place]) 09/16/2014  . Hematoma complicating a procedure 03/06/2014  . Primary cancer of right female breast ([place]) 01/10/2014  . SOB (shortness of breath) 03/01/2013  . Bilateral lower extremity edema   . 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 ANKLE SURGERY     Left  . ANKLE SURGERY    . ANTERIOR CERVICAL DECOMP/DISCECTOMY FUSION  11/17/2011   Procedure: ANTERIOR CERVICAL DECOMPRESSION/DISCECTOMY FUSION 2 LEVELS;  Surgeon: Erline Levine, MD;  Location: [facility];  Service: Neurosurgery;  Laterality: N/A;  Cervical Five-Six Six-Seven Anterior cervical decompression/diskectomy/fusion  . BREAST BIOPSY Right 12-31-13   invasive mammary  . BREAST SURGERY Right 02/03/2014   Right simple mastectomy with sentinel node biopsy.  . CHOLECYSTECTOMY    . COLONOSCOPY  2014  . HYSTEROSCOPY WITH D & C N/A 11/27/2018   Procedure: DILATATION AND CURETTAGE /HYSTEROSCOPY;  Surgeon: Gae Dry, MD;  Location: [facility] ORS;  Service: Gynecology;  Laterality: N/A;  . Lower extremity venous Dopplers  Feb 27, 2013   No LE DVT  . MASTECTOMY Right 2015  . Port a cath insertion Right 01/19/2010  . PORT-A-CATH REMOVAL     right  . PORT-A-CATH REMOVAL Right 09/03/2013   Procedure: REMOVAL PORT-A-CATH;  Surgeon: Conrad [place], MD;  Location: [facility];  Service: Vascular;  Laterality: Right;  . TONSILLECTOMY    . TRANSTHORACIC ECHOCARDIOGRAM  03/2013; 02/2014   a) Normal LV size and function with EF 60-65%.; Cannot exclude bicuspid aortic valve with mild AS and mild AI.; b) Normal EF with normal wall motion and valve function x Mild MR. G2 DD. EF 60-65%. Tricuspid AoV  . UPPER GI ENDOSCOPY  2014    Prior to Admission medications   Medication Sig Start Date End Date Taking? Authorizing Provider  amantadine (SYMMETREL) 100 MG capsule Take 1 capsule (100 mg total) by mouth 3 (three) times daily. 12/12/18   Penumalli, Earlean Polka, MD  baclofen (LIORESAL) 10 MG tablet TAKE 1 TO 2 TABLETS BY MOUTH 3 TIMES A DAY Patient taking differently: Take 10-20 mg by mouth See admin instructions. Take 2 tablets (20 mg) by mouth in the morning, 1 tablet (10 mg) by mouth at lunch, & 2 tablets (20 mg) by mouth at dinner. 11/06/18   Penumalli, Earlean Polka,  MD  Disposable Gloves (ASSURANCE VINYL EXAM GLOVES) MISC Neurogenic bladder; MS; LON 99 months; for use for personal hygiene 12/26/17   [name], MD  fluticasone (FLONASE) 50 MCG/ACT nasal spray SPRAY 2 SPRAYS INTO EACH NOSTRIL EVERY DAY 04/08/19   Delsa Grana, PA-C  Glucosamine-Chondroitin (COSAMIN DS PO) Take 1 tablet by mouth 2 (two) times daily.    [provider]  ibuprofen (ADVIL) 600 MG tablet TAKE 1 TABLET (600 MG TOTAL) BY MOUTH EVERY 8 (EIGHT) HOURS AS NEEDED. Patient taking differently: Take 600 mg by mouth every 8 (eight) hours as needed (pain.).  11/06/18   Delsa Grana, PA-C  Interferon Beta-1a (REBIF REBIDOSE) 44 MCG/0.5ML SOAJ Inject 0.5 mLs into the skin every Monday, Wednesday, and Friday. Patient taking differently: Inject 44 mcg into the skin every Monday, Wednesday, and Friday.  03/21/18   Penumalli, Earlean Polka, MD  levETIRAcetam (KEPPRA)  500 MG tablet Take 1 tablet (500 mg total) by mouth 2 (two) times daily. 12/12/18   Penumalli, Earlean Polka, MD  loratadine (CLARITIN) 10 MG tablet Take 10 mg by mouth daily.    [provider]  Misc Natural Products (LEG VEIN & CIRCULATION) TABS Take 1 tablet by mouth 2 (two) times daily.     [provider]  Multiple Vitamins-Minerals (HAIR SKIN AND NAILS FORMULA) TABS Take 1 tablet by mouth 2 (two) times daily.     [provider]  Multiple Vitamins-Minerals (MULTIVITAMIN PO) Take 1 tablet by mouth daily.     [provider]  oxybutynin (DITROPAN-XL) 10 MG 24 hr tablet Take 1 tablet (10 mg total) by mouth 2 (two) times daily. 12/12/18   Penumalli, Earlean Polka, MD  tiZANidine (ZANAFLEX) 4 MG tablet TAKE 1/2 TABLETS (2 MG TOTAL) BY MOUTH 2 (TWO) TIMES DAILY AS NEEDED (SPASTICITY UNCONTROLLED). 03/20/19   Jamse Arn, MD  Turmeric 500 MG CAPS Take 500 mg by mouth daily.    [provider]    Allergies Fentanyl and Sulfa antibiotics  Family History  Problem Relation Age of Onset  . Cancer  Father        skin  . Heart disease Father   . Heart attack Father        heart attack in his 26's  . Thyroid disease Sister   . Ovarian cancer Cousin   . Breast cancer Maternal Aunt 60  . Breast cancer Maternal Grandmother 58  . Bladder Cancer Neg Hx   . Kidney cancer Neg Hx     Social History Social History   Tobacco Use  . Smoking status: Never Smoker  . Smokeless tobacco: Never Used  Substance Use Topics  . Alcohol use: No  . Drug use: No    Review of Systems  Review of Systems  Constitutional: Negative for fatigue and fever.  HENT: Negative for congestion and sore throat.   Eyes: Negative for visual disturbance.  Respiratory: Negative for cough and shortness of breath.   Cardiovascular: Positive for leg swelling. Negative for chest pain.  Gastrointestinal: Negative for abdominal pain, diarrhea, nausea and vomiting.  Genitourinary: Negative for flank pain.  Musculoskeletal: Positive for arthralgias. Negative for back pain and neck pain.  Skin: Negative for rash and wound.  Neurological: Negative for weakness.  All other systems reviewed and are negative.    ____________________________________________  PHYSICAL EXAM:      VITAL SIGNS: ED Triage Vitals  Enc Vitals Group     BP 05/22/19 1154 (!) 148/84     Pulse Rate 05/22/19 1154 61     Resp 05/22/19 1154 18     Temp 05/22/19 1154 98.5 F (36.9 C)     Temp Source 05/22/19 1154 Oral     SpO2 05/22/19 1154 98 %     Weight 05/22/19 1152 250 lb (113.4 kg)     Height 05/22/19 1152 5\' 9"  (1.753 m)     Head Circumference --      Peak Flow --      Pain Score 05/22/19 1152 8     Pain Loc --      Pain Edu? --      Excl. in [place]? --      Physical Exam Vitals and nursing note reviewed.  Constitutional:      General: She is not in acute distress.    Appearance: She is well-developed.  HENT:     Head: Normocephalic and  atraumatic.  Eyes:     Conjunctiva/sclera: Conjunctivae normal.  Cardiovascular:      Rate and Rhythm: Normal rate and regular rhythm.     Heart sounds: Normal heart sounds.  Pulmonary:     Effort: Pulmonary effort is normal. No respiratory distress.     Breath sounds: No wheezing.  Abdominal:     General: There is no distension.  Musculoskeletal:     Cervical back: Neck supple.  Skin:    General: Skin is warm.     Capillary Refill: Capillary refill takes less than 2 seconds.     Findings: No rash.  Neurological:     Mental Status: She is alert and oriented to person, place, and time.     Motor: No abnormal muscle tone.      LOWER EXTREMITY EXAM: LEFT  INSPECTION & PALPATION: Left leg with foley catheter leg bag in place, with lower velcro strap tied fairly tight with pitting aroudn leg.  SENSORY: sensation is intact to light touch in:  Superficial peroneal nerve distribution (over dorsum of foot) Deep peroneal nerve distribution (over first dorsal web space) Sural nerve distribution (over lateral aspect 5th metatarsal) Saphenous nerve distribution (over medial instep)  MOTOR:  + Motor EHL (great toe dorsiflexion) + FHL (great toe plantar flexion)  + TA (ankle dorsiflexion)  + GSC (ankle plantar flexion)  VASCULAR: 2+ dorsalis pedis and posterior tibialis pulses Capillary refill < 2 sec, toes warm and well-perfused  COMPARTMENTS: Soft, warm, well-perfused No pain with passive extension No parethesias   ____________________________________________   LABS (all labs ordered are listed, but only abnormal results are displayed)  Labs Reviewed - No data to display  ____________________________________________  EKG: None ________________________________________  RADIOLOGY All imaging, including plain films, CT scans, and ultrasounds, independently reviewed by me, and interpretations confirmed via formal radiology reads.  ED MD interpretation:   US DVT: Negative  Official radiology report(s): US Venous Img Lower Unilateral Left  Result Date:  05/22/2019 CLINICAL DATA:  Lower extremity pain and edema EXAM: LEFT LOWER EXTREMITY VENOUS DUPLEX ULTRASOUND TECHNIQUE: Gray-scale sonography with graded compression, as well as color Doppler and duplex ultrasound were performed to evaluate the left lower extremity deep venous system from the level of the common femoral vein and including the common femoral, femoral, profunda femoral, popliteal and calf veins including the posterior tibial, peroneal and gastrocnemius veins when visible. The superficial great saphenous vein was also interrogated. Spectral Doppler was utilized to evaluate flow at rest and with distal augmentation maneuvers in the common femoral, femoral and popliteal veins. COMPARISON:  February 02, 2018 FINDINGS: Contralateral Common Femoral Vein: Respiratory phasicity is normal and symmetric with the symptomatic side. No evidence of thrombus. Normal compressibility. Common Femoral Vein: No evidence of thrombus. Normal compressibility, respiratory phasicity and response to augmentation. Saphenofemoral Junction: No evidence of thrombus. Normal compressibility and flow on color Doppler imaging. Profunda Femoral Vein: No evidence of thrombus. Normal compressibility and flow on color Doppler imaging. Femoral Vein: No evidence of thrombus. Normal compressibility, respiratory phasicity and response to augmentation. Popliteal Vein: No evidence of thrombus. Normal compressibility, respiratory phasicity and response to augmentation. Calf Veins: No evidence of thrombus. Normal compressibility and flow on color Doppler imaging. Superficial Great Saphenous Vein: No evidence of thrombus. Normal compressibility. Venous Reflux:  None. Other Findings:  None. IMPRESSION: No evidence of deep venous thrombosis in the left lower extremity. Right common femoral vein also patent. Electronically Signed   By: Lowella Grip III M.D.  On: 05/22/2019 12:44     ____________________________________________  PROCEDURES   Procedure(s) performed (including Critical Care):  Procedures  ____________________________________________  INITIAL IMPRESSION / MDM / [place] / ED COURSE  As part of my medical decision making, I reviewed the following data within the [facility] notes reviewed and incorporated, Old chart reviewed, Notes from prior ED visits, and [place] Controlled Substance Database       *Rebecca [name] was evaluated in Emergency Department on 05/22/2019 for the symptoms described in the history of present illness. She was evaluated in the context of the global COVID-19 pandemic, which necessitated consideration that the patient might be at risk for infection with the SARS-CoV-2 virus that causes COVID-19. Institutional protocols and algorithms that pertain to the evaluation of patients at risk for COVID-19 are in a state of rapid change based on information released by regulatory bodies including the CDC and federal and state organizations. These policies and algorithms were followed during the patient's care in the ED.  Some ED evaluations and interventions may be delayed as a result of limited staffing during the pandemic.*     Medical Decision Making:  59 yo F here with left lower leg pain and swelling. DVT study negative. Exam as above. I suspect pt has worsening distal lymphedema 2/2 tight strap of her left leg foley bag. Her sx all started after the new bag/strap was placed and she has been unable to place compression stockings on this leg due to the bag. Will replace the bag with looser-fitting, less constrictive strap and advise her to alternate legs, as well as use b/l compression stockings, to help prevent this. No signs of DVT. Distal strength, sensation intact. No rash, redness, or evidence of cellulitis.  ____________________________________________  FINAL CLINICAL IMPRESSION(S) / ED  DIAGNOSES  Final diagnoses:  Lymphedema     MEDICATIONS GIVEN DURING THIS VISIT:  Medications - No data to display   ED Discharge Orders    None       Note:  This document was prepared using Dragon voice recognition software and may include unintentional dictation errors.   Duffy Bruce, MD 05/22/19 1429

## 2019-05-22 NOTE — Discharge Instructions (Addendum)
I suspect your leg pain/swelling is related to the foley bag.  I recommend the following: - Use ONLY the button strap, or at least a very loose velcro strap, to prevent compression - Try alternating the leg that the bag is on every other day - Make sure that you use your compression stockings on BOTH legs - At the very least, try to use a tight-fitting sock on the leg with the bag, that comes to ABOVE the level of the strap  Elevate your legs as often as possible  Your DVT study was negative

## 2019-05-22 NOTE — ED Triage Notes (Signed)
First nurse note- PT called EMS for possible blood in LLE.  Pain to left calf and swelling.

## 2019-05-23 ENCOUNTER — Telehealth: Payer: Self-pay

## 2019-05-23 ENCOUNTER — Encounter: Payer: Medicare Other | Admitting: Physical Medicine & Rehabilitation

## 2019-05-23 NOTE — Telephone Encounter (Signed)
FYI

## 2019-05-23 NOTE — Telephone Encounter (Signed)
Copied from [place] [phone]. Topic: General - Other >> May 22, 2019  2:38 PM Yvette Rack wrote: Reason for CRM: Eulah Citizen with [facility] called to advise pt was sent to hospital due to left calf pain and swelling. Cb# [id number]

## 2019-05-24 NOTE — Progress Notes (Deleted)
[place]  [phone] [phone]  ID: Rebecca [name] OB: April 08, 1960  MR#: [id number]  PNT#:[id number]  Patient Care Team: Delsa Grana, PA-C as PCP - General (Family Medicine) Bobetta Lime, MD as Referring Physician Byrnett, Forest Gleason, MD (General Surgery) Penni Bombard, MD as Consulting Physician (Neurology) Lloyd Huger, MD as Consulting Physician (Oncology) Leonie Man, MD as Consulting Physician (Cardiology) Minna Merritts, MD as Consulting Physician (Cardiology) Laneta Simmers as Physician Assistant (Urology) Cathi Roan, [facility] (Pharmacist)  CHIEF COMPLAINT: 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 BRCA negative.  INTERVAL HISTORY: Patient returns to clinic today for routine 55-monthevaluation.  She continues to have a chronic Foley catheter.  She has had no progression of her MS.  She currently feels well and is at her baseline.  She does not complain of pain today. Her lower extremity edema is unchanged. She has no new neurologic complaints.  She denies any chest pain, shortness of breath, cough, or hemoptysis.  She denies any nausea, vomiting, constipation, or diarrhea.  She has no urinary complaints.  Patient offers no further specific complaints today.  REVIEW OF SYSTEMS:   Review of Systems  Constitutional: Negative for chills, fever and malaise/fatigue.  Respiratory: Negative for cough and shortness of breath.   Cardiovascular: Positive for leg swelling. Negative for chest pain.  Gastrointestinal: Negative for abdominal pain, constipation, diarrhea, nausea and vomiting.  Genitourinary: Negative.  Negative for dysuria, frequency and urgency.  Musculoskeletal: Positive for back pain.  Skin: Negative.  Negative for rash.  Neurological: Positive for focal weakness. Negative for sensory change, weakness and headaches.  Psychiatric/Behavioral: Negative.  The patient is not  nervous/anxious.     As per HPI. Otherwise, a complete review of systems is negative.  PAST MEDICAL HISTORY: Past Medical History:  Diagnosis Date  . Aortic valve disease    Mild AS / AI - most recent Echo demonstrated tricuspid aortic valve.  . Bacterial endocarditis    History of .  .Marland KitchenBilateral lower extremity edema    Noncardiac.  Chronic. LE Venous dopplers - negative for DVT.; Echocardiogram January 2016: Normal EF with normal wall motion and valve function. Only grade 1 diastolic dysfunction. EF 60-65%. Mild MR  . [place]   Right breast, 12:00, 1.5 cm, T1c,N0 invasive mammary carcinoma, triple negative. --> Rx with Chemo  . Cervical stenosis of spine   . Herpes zoster   . IBS (irritable bowel syndrome)   . Lymphedema    [facility] . [place]   Walks from room to room @ home; but Wheelchair when going out.  .[place]Neuromuscular disorder ([place]    MS  . PONV (postoperative nausea and vomiting)    Related to Fentanyl  . [place]    Takes Keppra  . Syncope and collapse     PAST SURGICAL HISTORY: Past Surgical History:  Procedure Laterality Date  . ANKLE SURGERY     Left  . ANKLE SURGERY    . ANTERIOR CERVICAL DECOMP/DISCECTOMY FUSION  11/17/2011   Procedure: ANTERIOR CERVICAL DECOMPRESSION/DISCECTOMY FUSION 2 LEVELS;  Surgeon: JErline Levine MD;  [facility];  Service: Neurosurgery;  Laterality: N/A;  Cervical Five-Six Six-Seven Anterior cervical decompression/diskectomy/fusion  . BREAST BIOPSY Right 12-31-13   invasive mammary  . BREAST SURGERY Right 02/03/2014   Right simple mastectomy with sentinel node biopsy.  . CHOLECYSTECTOMY    . COLONOSCOPY  2014  . HYSTEROSCOPY WITH D & C N/A 11/27/2018   Procedure: DILATATION AND CURETTAGE /HYSTEROSCOPY;  Surgeon: Gae Dry, MD;  Location: [facility] OR[facility];  Service: Gynecology;  Laterality: N/A;  . Lower extremity venous Dopplers  Feb 27, 2013   No LE DVT    . MASTECTOMY Right 2015  . Port a cath insertion Right 01/19/2010  . PORT-A-CATH REMOVAL     right  . PORT-A-CATH REMOVAL Right 09/03/2013   Procedure: REMOVAL PORT-A-CATH;  Surgeon: Conrad , MD;  Location: [facility];  Service: Vascular;  Laterality: Right;  . TONSILLECTOMY    . TRANSTHORACIC ECHOCARDIOGRAM  03/2013; 02/2014   a) Normal LV size and function with EF 60-65%.; Cannot exclude bicuspid aortic valve with mild AS and mild AI.; b) Normal EF with normal wall motion and valve function x Mild MR. G2 DD. EF 60-65%. Tricuspid AoV  . UPPER GI ENDOSCOPY  2014    FAMILY HISTORY Family History  Problem Relation Age of Onset  . Cancer Father        skin  . Heart disease Father   . Heart attack Father        heart attack in his 90's  . Thyroid disease Sister   . Ovarian cancer Cousin   . Breast cancer Maternal Aunt 60  . Breast cancer Maternal Grandmother 104  . Bladder Cancer Neg Hx   . Kidney cancer Neg Hx        ADVANCED DIRECTIVES:    HEALTH MAINTENANCE: Social History   Tobacco Use  . Smoking status: Never Smoker  . Smokeless tobacco: Never Used  Substance Use Topics  . Alcohol use: No  . Drug use: No     Colonoscopy:  PAP:  Bone density:  Lipid panel:  Allergies  Allergen Reactions  . Fentanyl Nausea And Vomiting and Nausea Only    vomiting Was given in PACU x3 each time patient got sick vomiting Was given in PACU x3 each time patient got sick  . Sulfa Antibiotics Hives and Other (See Comments)    Light headed, over heated    Current Outpatient Medications  Medication Sig Dispense Refill  . amantadine (SYMMETREL) 100 MG capsule Take 1 capsule (100 mg total) by mouth 3 (three) times daily. 270 capsule 4  . baclofen (LIORESAL) 10 MG tablet TAKE 1 TO 2 TABLETS BY MOUTH 3 TIMES A DAY (Patient taking differently: Take 10-20 mg by mouth See admin instructions. Take 2 tablets (20 mg) by mouth in the morning, 1 tablet (10 mg) by mouth at lunch, & 2 tablets (20  mg) by mouth at dinner.) 540 tablet 2  . Disposable Gloves (ASSURANCE VINYL EXAM GLOVES) MISC Neurogenic bladder; MS; LON 99 months; for use for personal hygiene 50 each 11  . fluticasone (FLONASE) 50 MCG/ACT nasal spray SPRAY 2 SPRAYS INTO EACH NOSTRIL EVERY DAY 48 mL 3  . Glucosamine-Chondroitin (COSAMIN DS PO) Take 1 tablet by mouth 2 (two) times daily.    Marland Kitchen ibuprofen (ADVIL) 600 MG tablet TAKE 1 TABLET (600 MG TOTAL) BY MOUTH EVERY 8 (EIGHT) HOURS AS NEEDED. (Patient taking differently: Take 600 mg by mouth every 8 (eight) hours as needed (pain.). ) 90 tablet 3  . Interferon Beta-1a (REBIF REBIDOSE) 44 MCG/0.5ML SOAJ Inject 0.5 mLs into the skin every Monday, Wednesday, and Friday. (Patient taking differently: Inject 44 mcg into the skin every Monday, Wednesday, and Friday. ) 12 Syringe 11  . levETIRAcetam (KEPPRA) 500 MG tablet Take 1 tablet (  500 mg total) by mouth 2 (two) times daily. 180 tablet 4  . loratadine (CLARITIN) 10 MG tablet Take 10 mg by mouth daily.    . Misc Natural Products (LEG VEIN & CIRCULATION) TABS Take 1 tablet by mouth 2 (two) times daily.     . Multiple Vitamins-Minerals (HAIR SKIN AND NAILS FORMULA) TABS Take 1 tablet by mouth 2 (two) times daily.     . Multiple Vitamins-Minerals (MULTIVITAMIN PO) Take 1 tablet by mouth daily.     Marland Kitchen oxybutynin (DITROPAN-XL) 10 MG 24 hr tablet Take 1 tablet (10 mg total) by mouth 2 (two) times daily. 180 tablet 4  . tiZANidine (ZANAFLEX) 4 MG tablet TAKE 1/2 TABLETS (2 MG TOTAL) BY MOUTH 2 (TWO) TIMES DAILY AS NEEDED (SPASTICITY UNCONTROLLED). 90 tablet 1  . Turmeric 500 MG CAPS Take 500 mg by mouth daily.     No current facility-administered medications for this visit.    OBJECTIVE: There were no vitals filed for this visit.   There is no height or weight on file to calculate BMI.    ECOG FS:2 - Symptomatic, <50% confined to bed  General: Well-developed, well-nourished, no acute distress.  Sitting in a wheelchair. Eyes: Pink  conjunctiva, anicteric sclera. HEENT: Normocephalic, moist mucous membranes. Lungs: Clear to auscultation bilaterally. Heart: Regular rate and rhythm. No rubs, murmurs, or gallops. Abdomen: Soft, nontender, nondistended. No organomegaly noted, normoactive bowel sounds. Musculoskeletal: Bilateral 2-3+ peripheral edema.  Foley catheter noted. Neuro: Alert, answering all questions appropriately. Cranial nerves grossly intact. Skin: No rashes or petechiae noted. Psych: Normal affect.  LAB RESULTS:  Lab Results  Component Value Date   NA 141 01/18/2019   K 4.2 01/18/2019   CL 104 01/18/2019   CO2 25 01/18/2019   GLUCOSE 97 01/18/2019   BUN 16 01/18/2019   CREATININE 0.59 01/18/2019   CALCIUM 9.6 01/18/2019   PROT 7.7 01/18/2019   ALBUMIN 3.9 11/01/2018   AST 24 01/18/2019   ALT 26 01/18/2019   ALKPHOS 92 11/01/2018   BILITOT 0.4 01/18/2019   GFRNONAA 101 01/18/2019   GFRAA 117 01/18/2019    Lab Results  Component Value Date   WBC 3.7 (L) 01/18/2019   NEUTROABS 2,124 01/18/2019   HGB 12.9 01/18/2019   HCT 38.7 01/18/2019   MCV 90.8 01/18/2019   PLT 147 01/18/2019      STUDIES: US Venous Img Lower Unilateral Left  Result Date: 05/22/2019 CLINICAL DATA:  Lower extremity pain and edema EXAM: LEFT LOWER EXTREMITY VENOUS DUPLEX ULTRASOUND TECHNIQUE: Gray-scale sonography with graded compression, as well as color Doppler and duplex ultrasound were performed to evaluate the left lower extremity deep venous system from the level of the common femoral vein and including the common femoral, femoral, profunda femoral, popliteal and calf veins including the posterior tibial, peroneal and gastrocnemius veins when visible. The superficial great saphenous vein was also interrogated. Spectral Doppler was utilized to evaluate flow at rest and with distal augmentation maneuvers in the common femoral, femoral and popliteal veins. COMPARISON:  February 02, 2018 FINDINGS: Contralateral Common  Femoral Vein: Respiratory phasicity is normal and symmetric with the symptomatic side. No evidence of thrombus. Normal compressibility. Common Femoral Vein: No evidence of thrombus. Normal compressibility, respiratory phasicity and response to augmentation. Saphenofemoral Junction: No evidence of thrombus. Normal compressibility and flow on color Doppler imaging. Profunda Femoral Vein: No evidence of thrombus. Normal compressibility and flow on color Doppler imaging. Femoral Vein: No evidence of thrombus. Normal compressibility, respiratory phasicity and response  to augmentation. Popliteal Vein: No evidence of thrombus. Normal compressibility, respiratory phasicity and response to augmentation. Calf Veins: No evidence of thrombus. Normal compressibility and flow on color Doppler imaging. Superficial Great Saphenous Vein: No evidence of thrombus. Normal compressibility. Venous Reflux:  None. Other Findings:  None. IMPRESSION: No evidence of deep venous thrombosis in the left lower extremity. Right common femoral vein also patent. Electronically Signed   By: Lowella Grip III M.D.   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 BRCA negative.  PLAN:    1. Triple negative right breast cancer: No evidence of disease. Given her difficulties with Taxol and multiple sclerosis, treatment was discontinued after a total of 6 of 12 cycles of Taxol. Her last chemotherapy was July 17, 2014.  Because patient had a full mastectomy, she did not require adjuvant XRT. Given the fact that she is a triple negative cancer, she does not require an aromatase inhibitor.  Patient's most recent left screening mammogram on September 06, 2018 was reported as BI-RADS 1.  Repeat in July 2021.  CA 27-29 continues to be within normal limits, today's result is pending.  Return to clinic in 6 months for routine evaluation at which point patient can likely be transitioned to yearly visits.   2.  Multiple sclerosis: Continue evaluation and current treatment per neurology. Her primary neurologist is Dr. Andrey Spearman.  phone [phone], fax [phone]. 3. Lymphedema: Chronic and unchanged.  Continue treatment and wraps as prescribed. 4. Peripheral neuropathy: Patient does not complain of this today. 5. Pain: Patient is no longer taking narcotics. 6.  Thrombocytopenia: Resolved. 7.  Foley catheter: Patient states she was previously evaluated for a suprapubic catheter, but is unclear if this procedure is going to proceed or not.  Patient expressed understanding and was in agreement with this plan. She also understands that She can call clinic at any time with any questions, concerns, or complaints.   Breast cancer   Staging form: Breast, AJCC 7th Edition     Pathologic stage from 05/24/2014: Stage IA (T1c, N0, cM0) - Signed by Lloyd Huger, MD on 05/24/2014   Lloyd Huger, MD   05/24/2019 6:43 AM

## 2019-05-27 ENCOUNTER — Telehealth: Payer: Self-pay | Admitting: Oncology

## 2019-05-27 NOTE — Telephone Encounter (Signed)
Patient phoned and stated that her husband was in the hospital and that she would not be able to attend appt as she would not have transportation. Appts have been rescheduled for 06-06-19.

## 2019-05-28 ENCOUNTER — Inpatient Hospital Stay: Payer: Medicare Other

## 2019-05-28 ENCOUNTER — Inpatient Hospital Stay: Payer: Medicare Other | Admitting: Oncology

## 2019-05-30 ENCOUNTER — Ambulatory Visit: Payer: Medicare Other | Admitting: Family Medicine

## 2019-05-30 ENCOUNTER — Ambulatory Visit: Payer: Medicare Other

## 2019-06-01 NOTE — Progress Notes (Signed)
[place]  [phone] Fax:([phone]) [phone]  ID: Rebecca [name] OB: 03-11-1960  MR#: [id number]  ION#:[id number]  Patient Care Team: Delsa Grana, PA-C as PCP - General (Family Medicine) Bobetta Lime, MD as Referring Physician Byrnett, Forest Gleason, MD (General Surgery) Penni Bombard, MD as Consulting Physician (Neurology) Lloyd Huger, MD as Consulting Physician (Oncology) Leonie Man, MD as Consulting Physician (Cardiology) Minna Merritts, MD as Consulting Physician (Cardiology) Laneta Simmers as Physician Assistant (Urology) Cathi Roan, [facility] (Pharmacist)  CHIEF COMPLAINT: 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 BRCA negative.  INTERVAL HISTORY: Patient returns to clinic today for routine 90-monthevaluation.  She has no new neurologic complaints and her multiple sclerosis has been stable.  She currently feels well and is at her baseline.  She does not complain of pain today. Her lower extremity edema is unchanged.  She denies any recent fevers or illnesses.  She denies any chest pain, shortness of breath, cough, or hemoptysis.  She denies any nausea, vomiting, constipation, or diarrhea.  She has no urinary complaints.  Patient offers no specific complaints today.  REVIEW OF SYSTEMS:   Review of Systems  Constitutional: Negative for chills, fever and malaise/fatigue.  Respiratory: Negative for cough and shortness of breath.   Cardiovascular: Positive for leg swelling. Negative for chest pain.  Gastrointestinal: Negative for abdominal pain, constipation, diarrhea, nausea and vomiting.  Genitourinary: Negative.  Negative for dysuria, frequency and urgency.  Musculoskeletal: Positive for back pain.  Skin: Negative.  Negative for rash.  Neurological: Positive for focal weakness. Negative for sensory change, weakness and headaches.  Psychiatric/Behavioral: Negative.  The patient is not nervous/anxious.      As per HPI. Otherwise, a complete review of systems is negative.  PAST MEDICAL HISTORY: Past Medical History:  Diagnosis Date  . Aortic valve disease    Mild AS / AI - most recent Echo demonstrated tricuspid aortic valve.  . Bacterial endocarditis    History of .  .Marland KitchenBilateral lower extremity edema    Noncardiac.  Chronic. LE Venous dopplers - negative for DVT.; Echocardiogram January 2016: Normal EF with normal wall motion and valve function. Only grade 1 diastolic dysfunction. EF 60-65%. Mild MR  . Breast cancer ([place] 12-31-13   Right breast, 12:00, 1.5 cm, T1c,N0 invasive mammary carcinoma, triple negative. --> Rx with Chemo  . Cervical stenosis of spine   . Herpes zoster   . IBS (irritable bowel syndrome)   . Lymphedema    [facility] . [place]   Walks from room to room @ home; but Wheelchair when going out.  .Marland KitchenNeuromuscular disorder ([place]    MS  . PONV (postoperative nausea and vomiting)    Related to Fentanyl  . [place]ures [place]    Takes Keppra  . Syncope and collapse     PAST SURGICAL HISTORY: Past Surgical History:  Procedure Laterality Date  . ANKLE SURGERY     Left  . ANKLE SURGERY    . ANTERIOR CERVICAL DECOMP/DISCECTOMY FUSION  11/17/2011   Procedure: ANTERIOR CERVICAL DECOMPRESSION/DISCECTOMY FUSION 2 LEVELS;  Surgeon: JErline Levine MD;  [facility];  Service: Neurosurgery;  Laterality: N/A;  Cervical Five-Six Six-Seven Anterior cervical decompression/diskectomy/fusion  . BREAST BIOPSY Right 12-31-13   invasive mammary  . BREAST SURGERY Right 02/03/2014   Right simple mastectomy with sentinel node biopsy.  . CHOLECYSTECTOMY    . COLONOSCOPY  2014  .  HYSTEROSCOPY WITH D & C N/A 11/27/2018   Procedure: DILATATION AND CURETTAGE /HYSTEROSCOPY;  Surgeon: Gae Dry, MD;  Location: [facility] ORS;  Service: Gynecology;  Laterality: N/A;  . Lower extremity venous Dopplers  Feb 27, 2013   No LE DVT  . MASTECTOMY  Right 2015  . Port a cath insertion Right 01/19/2010  . PORT-A-CATH REMOVAL     right  . PORT-A-CATH REMOVAL Right 09/03/2013   Procedure: REMOVAL PORT-A-CATH;  Surgeon: Conrad , MD;  Location: [place];  Service: Vascular;  Laterality: Right;  . TONSILLECTOMY    . TRANSTHORACIC ECHOCARDIOGRAM  03/2013; 02/2014   a) Normal LV size and function with EF 60-65%.; Cannot exclude bicuspid aortic valve with mild AS and mild AI.; b) Normal EF with normal wall motion and valve function x Mild MR. G2 DD. EF 60-65%. Tricuspid AoV  . UPPER GI ENDOSCOPY  2014    FAMILY HISTORY Family History  Problem Relation Age of Onset  . Cancer Father        skin  . Heart disease Father   . Heart attack Father        heart attack in his 43's  . Thyroid disease Sister   . Ovarian cancer Cousin   . Breast cancer Maternal Aunt 60  . Breast cancer Maternal Grandmother 6  . Bladder Cancer Neg Hx   . Kidney cancer Neg Hx        ADVANCED DIRECTIVES:    HEALTH MAINTENANCE: Social History   Tobacco Use  . Smoking status: Never Smoker  . Smokeless tobacco: Never Used  Substance Use Topics  . Alcohol use: No  . Drug use: No     Colonoscopy:  PAP:  Bone density:  Lipid panel:  Allergies  Allergen Reactions  . Fentanyl Nausea And Vomiting and Nausea Only    vomiting Was given in PACU x3 each time patient got sick vomiting Was given in PACU x3 each time patient got sick  . Sulfa Antibiotics Hives and Other (See Comments)    Light headed, over heated    Current Outpatient Medications  Medication Sig Dispense Refill  . amantadine (SYMMETREL) 100 MG capsule Take 1 capsule (100 mg total) by mouth 3 (three) times daily. 270 capsule 4  . baclofen (LIORESAL) 10 MG tablet TAKE 1 TO 2 TABLETS BY MOUTH 3 TIMES A DAY (Patient taking differently: Take 10-20 mg by mouth See admin instructions. Take 2 tablets (20 mg) by mouth in the morning, 1 tablet (10 mg) by mouth at lunch, & 2 tablets (20 mg) by mouth  at dinner.) 540 tablet 2  . Cranberry 1000 MG CAPS Take by mouth.    . Disposable Gloves (ASSURANCE VINYL EXAM GLOVES) MISC Neurogenic bladder; MS; LON 99 months; for use for personal hygiene 50 each 11  . fluticasone (FLONASE) 50 MCG/ACT nasal spray SPRAY 2 SPRAYS INTO EACH NOSTRIL EVERY DAY 48 mL 3  . Glucosamine-Chondroitin (COSAMIN DS PO) Take 1 tablet by mouth 2 (two) times daily.    Marland Kitchen ibuprofen (ADVIL) 600 MG tablet TAKE 1 TABLET (600 MG TOTAL) BY MOUTH EVERY 8 (EIGHT) HOURS AS NEEDED. (Patient taking differently: Take 600 mg by mouth every 8 (eight) hours as needed (pain.). ) 90 tablet 3  . Interferon Beta-1a (REBIF REBIDOSE) 44 MCG/0.5ML SOAJ Inject 0.5 mLs into the skin every Monday, Wednesday, and Friday. (Patient taking differently: Inject 44 mcg into the skin every Monday, Wednesday, and Friday. ) 12 Syringe 11  . levETIRAcetam (  KEPPRA) 500 MG tablet Take 1 tablet (500 mg total) by mouth 2 (two) times daily. 180 tablet 4  . loratadine (CLARITIN) 10 MG tablet Take 10 mg by mouth daily.    . Misc Natural Products (LEG VEIN & CIRCULATION) TABS Take 1 tablet by mouth 2 (two) times daily.     . Multiple Vitamins-Minerals (HAIR SKIN AND NAILS FORMULA) TABS Take 1 tablet by mouth 2 (two) times daily.     . Multiple Vitamins-Minerals (MULTIVITAMIN PO) Take 1 tablet by mouth daily.     Marland Kitchen oxybutynin (DITROPAN-XL) 10 MG 24 hr tablet Take 1 tablet (10 mg total) by mouth 2 (two) times daily. 180 tablet 4  . tiZANidine (ZANAFLEX) 4 MG tablet TAKE 1/2 TABLETS (2 MG TOTAL) BY MOUTH 2 (TWO) TIMES DAILY AS NEEDED (SPASTICITY UNCONTROLLED). 90 tablet 1  . Turmeric 500 MG CAPS Take 500 mg by mouth daily.     No current facility-administered medications for this visit.    OBJECTIVE: Vitals:   06/06/19 1544  BP: 132/67  Pulse: 80  Resp: 20  Temp: 97.8 F (36.6 C)  SpO2: 96%     Body mass index is 31.6 kg/m.    ECOG FS:2 - Symptomatic, <50% confined to bed  General: Well-developed,  well-nourished, no acute distress.  Sitting in a wheelchair. Eyes: Pink conjunctiva, anicteric sclera. HEENT: Normocephalic, moist mucous membranes. Lungs: No audible wheezing or coughing. Heart: Regular rate and rhythm. Abdomen: Soft, nontender, no obvious distention. Musculoskeletal: 2-3+ bilateral lower extremity edema. Neuro: Alert, answering all questions appropriately. Cranial nerves grossly intact. Skin: No rashes or petechiae noted. Psych: Normal affect.   LAB RESULTS:  Lab Results  Component Value Date   NA 141 01/18/2019   K 4.2 01/18/2019   CL 104 01/18/2019   CO2 25 01/18/2019   GLUCOSE 97 01/18/2019   BUN 16 01/18/2019   CREATININE 0.59 01/18/2019   CALCIUM 9.6 01/18/2019   PROT 7.7 01/18/2019   ALBUMIN 3.9 11/01/2018   AST 24 01/18/2019   ALT 26 01/18/2019   ALKPHOS 92 11/01/2018   BILITOT 0.4 01/18/2019   GFRNONAA 101 01/18/2019   GFRAA 117 01/18/2019    Lab Results  Component Value Date   WBC 3.7 (L) 01/18/2019   NEUTROABS 2,124 01/18/2019   HGB 12.9 01/18/2019   HCT 38.7 01/18/2019   MCV 90.8 01/18/2019   PLT 147 01/18/2019      STUDIES: US Venous Img Lower Unilateral Left  Result Date: 05/22/2019 CLINICAL DATA:  Lower extremity pain and edema EXAM: LEFT LOWER EXTREMITY VENOUS DUPLEX ULTRASOUND TECHNIQUE: Gray-scale sonography with graded compression, as well as color Doppler and duplex ultrasound were performed to evaluate the left lower extremity deep venous system from the level of the common femoral vein and including the common femoral, femoral, profunda femoral, popliteal and calf veins including the posterior tibial, peroneal and gastrocnemius veins when visible. The superficial great saphenous vein was also interrogated. Spectral Doppler was utilized to evaluate flow at rest and with distal augmentation maneuvers in the common femoral, femoral and popliteal veins. COMPARISON:  February 02, 2018 FINDINGS: Contralateral Common Femoral Vein:  Respiratory phasicity is normal and symmetric with the symptomatic side. No evidence of thrombus. Normal compressibility. Common Femoral Vein: No evidence of thrombus. Normal compressibility, respiratory phasicity and response to augmentation. Saphenofemoral Junction: No evidence of thrombus. Normal compressibility and flow on color Doppler imaging. Profunda Femoral Vein: No evidence of thrombus. Normal compressibility and flow on color Doppler imaging. Femoral Vein: No  evidence of thrombus. Normal compressibility, respiratory phasicity and response to augmentation. Popliteal Vein: No evidence of thrombus. Normal compressibility, respiratory phasicity and response to augmentation. Calf Veins: No evidence of thrombus. Normal compressibility and flow on color Doppler imaging. Superficial Great Saphenous Vein: No evidence of thrombus. Normal compressibility. Venous Reflux:  None. Other Findings:  None. IMPRESSION: No evidence of deep venous thrombosis in the left lower extremity. Right common femoral vein also patent. Electronically Signed   By: Lowella Grip III M.D.   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 BRCA negative.  PLAN:    1. Triple negative right breast cancer: No evidence of disease. Given her difficulties with Taxol and multiple sclerosis, treatment was discontinued after a total of 6 of 12 cycles of Taxol. Her last chemotherapy was July 17, 2014.  Because patient had a full mastectomy, she did not require adjuvant XRT. Given the fact that she is a triple negative cancer, she does not require an aromatase inhibitor.  Patient's most recent left screening mammogram on September 06, 2018 was reported as BI-RADS 1.  Repeat in July 2021.  CA 27-29 continues to be within normal limits, today's result is pending.  Patient is now greater than 5 years out from completing her treatment and can be transitioned to yearly visits. 2. Multiple sclerosis:  Continue evaluation and current treatment per neurology. Her primary neurologist is Dr. Andrey Spearman.  phone [phone], fax [phone]. 3. Lymphedema: Chronic and unchanged.  Continue treatment and wraps as prescribed. 4. Peripheral neuropathy: Patient does not complain of this today. 5. Pain: Patient is no longer taking narcotics. 6.  Thrombocytopenia: Resolved. 7.  Foley catheter: Patient states she was previously evaluated for a suprapubic catheter, but is unclear if this procedure is going to proceed or not.  I spent a total of 20 minutes reviewing chart data, face-to-face evaluation with the patient, counseling and coordination of care as detailed above.    Patient expressed understanding and was in agreement with this plan. She also understands that She can call clinic at any time with any questions, concerns, or complaints.   Breast cancer   Staging form: Breast, AJCC 7th Edition     Pathologic stage from 05/24/2014: Stage IA (T1c, N0, cM0) - Signed by Lloyd Huger, MD on 05/24/2014   Lloyd Huger, MD   06/07/2019 7:06 AM

## 2019-06-05 ENCOUNTER — Other Ambulatory Visit: Payer: Self-pay

## 2019-06-05 ENCOUNTER — Encounter: Payer: Self-pay | Admitting: Oncology

## 2019-06-05 NOTE — Progress Notes (Signed)
Patient prescreened for appointment. Patient has no concerns or questions.  

## 2019-06-06 ENCOUNTER — Inpatient Hospital Stay (HOSPITAL_BASED_OUTPATIENT_CLINIC_OR_DEPARTMENT_OTHER): Payer: Medicare Other | Admitting: Oncology

## 2019-06-06 ENCOUNTER — Encounter: Payer: Self-pay | Admitting: Oncology

## 2019-06-06 ENCOUNTER — Encounter: Payer: Medicare Other | Admitting: Physical Medicine & Rehabilitation

## 2019-06-06 ENCOUNTER — Inpatient Hospital Stay: Payer: Medicare Other | Attending: Oncology

## 2019-06-06 VITALS — BP 132/67 | HR 80 | Temp 97.8°F | Resp 20 | Wt 214.0 lb

## 2019-06-06 DIAGNOSIS — G35 Multiple sclerosis: Secondary | ICD-10-CM | POA: Diagnosis not present

## 2019-06-06 DIAGNOSIS — I890 Lymphedema, not elsewhere classified: Secondary | ICD-10-CM | POA: Diagnosis not present

## 2019-06-06 DIAGNOSIS — Z9011 Acquired absence of right breast and nipple: Secondary | ICD-10-CM | POA: Diagnosis not present

## 2019-06-06 DIAGNOSIS — Z8041 Family history of malignant neoplasm of ovary: Secondary | ICD-10-CM | POA: Insufficient documentation

## 2019-06-06 DIAGNOSIS — Z803 Family history of malignant neoplasm of breast: Secondary | ICD-10-CM | POA: Insufficient documentation

## 2019-06-06 DIAGNOSIS — C50911 Malignant neoplasm of unspecified site of right female breast: Secondary | ICD-10-CM

## 2019-06-06 DIAGNOSIS — Z853 Personal history of malignant neoplasm of breast: Secondary | ICD-10-CM | POA: Insufficient documentation

## 2019-06-06 DIAGNOSIS — Z9221 Personal history of antineoplastic chemotherapy: Secondary | ICD-10-CM | POA: Diagnosis not present

## 2019-06-06 DIAGNOSIS — G629 Polyneuropathy, unspecified: Secondary | ICD-10-CM | POA: Diagnosis not present

## 2019-06-06 MED ORDER — SODIUM CHLORIDE 0.9% FLUSH
10.0000 mL | Freq: Once | INTRAVENOUS | Status: AC
  Administered 2019-06-06: 10 mL via INTRAVENOUS
  Filled 2019-06-06: qty 10

## 2019-06-06 MED ORDER — HEPARIN SOD (PORK) LOCK FLUSH 100 UNIT/ML IV SOLN
500.0000 [IU] | Freq: Once | INTRAVENOUS | Status: AC
  Administered 2019-06-06: 500 [IU] via INTRAVENOUS
  Filled 2019-06-06: qty 5

## 2019-06-06 MED ORDER — HEPARIN SOD (PORK) LOCK FLUSH 100 UNIT/ML IV SOLN
INTRAVENOUS | Status: AC
  Filled 2019-06-06: qty 5

## 2019-06-07 ENCOUNTER — Ambulatory Visit: Payer: Medicare Other

## 2019-06-07 LAB — CANCER ANTIGEN 27.29: CA 27.29: 20.3 U/mL (ref 0.0–38.6)

## 2019-06-10 ENCOUNTER — Telehealth: Payer: Self-pay | Admitting: Physician Assistant

## 2019-06-10 NOTE — Telephone Encounter (Signed)
Patient called the office today requesting that we check her urine for infection.  She reports urine in her catheter bag as dark amber and foul smelling. She says that she has a sterile urine cup at home and can get a family member to bring a sample to the office to send to the lab.    Please contact patient to advise.

## 2019-06-11 ENCOUNTER — Ambulatory Visit: Payer: Medicare Other | Admitting: Family Medicine

## 2019-06-11 NOTE — Telephone Encounter (Signed)
Spoke with patient today, she denies fever, chills, nausea or vomiting. She states home health has been exchanging her cath and is due for a change tomorrow. Patient was encouraged to increase water intake. It was explained that color and odor do not indicate infection. Patient verbalized understanding and states she will call if she develops these symptoms.

## 2019-06-24 ENCOUNTER — Other Ambulatory Visit: Payer: Self-pay

## 2019-06-24 ENCOUNTER — Encounter: Payer: Self-pay | Admitting: Physical Medicine & Rehabilitation

## 2019-06-24 ENCOUNTER — Encounter: Payer: Medicare Other | Attending: Physical Medicine & Rehabilitation | Admitting: Physical Medicine & Rehabilitation

## 2019-06-24 VITALS — BP 132/83 | HR 90 | Ht 69.0 in | Wt 214.0 lb

## 2019-06-24 DIAGNOSIS — R269 Unspecified abnormalities of gait and mobility: Secondary | ICD-10-CM | POA: Insufficient documentation

## 2019-06-24 DIAGNOSIS — N319 Neuromuscular dysfunction of bladder, unspecified: Secondary | ICD-10-CM

## 2019-06-24 DIAGNOSIS — G35 Multiple sclerosis: Secondary | ICD-10-CM | POA: Insufficient documentation

## 2019-06-24 DIAGNOSIS — G801 Spastic diplegic cerebral palsy: Secondary | ICD-10-CM | POA: Diagnosis not present

## 2019-06-24 DIAGNOSIS — I890 Lymphedema, not elsewhere classified: Secondary | ICD-10-CM | POA: Diagnosis not present

## 2019-06-24 DIAGNOSIS — G8220 Paraplegia, unspecified: Secondary | ICD-10-CM | POA: Diagnosis present

## 2019-06-24 DIAGNOSIS — N3644 Muscular disorders of urethra: Secondary | ICD-10-CM | POA: Insufficient documentation

## 2019-06-24 MED ORDER — TIZANIDINE HCL 4 MG PO TABS: ORAL_TABLET | ORAL | 1 refills | Status: DC

## 2019-06-24 NOTE — Progress Notes (Signed)
Subjective:    Patient ID: Rebecca [name], female    DOB: 1960-04-14, 59 y.o.   MRN: [id number]  HPI  Female, history of chronic pain, lymphedema bilateral lower extremities, right breast cancer, chemotherapy, seizure disorder, multiple sclerosis presents for follow up for MS.  Last clinic visit 02/28/19.  Husband supplements history. Since that time, pt went to the ED for LE edema, notes reviewed - unremarkable for DVT. Pt states, she is doing exercises with RN. She was fitted and has KAFO. She continues to take Baclofen and Tizanidine. Tizanidine usually 1/day. Denies falls. Using walker at home and wheelchair in community. She is still using indwelling foley.   Pain Inventory Average Pain 7 Pain Right Now 0 My pain is constant  In the last 24 hours, has pain interfered with the following? General activity 0 Relation with others 6 Enjoyment of life 7 What TIME of day is your pain at its worst? evening, night Sleep (in general) Fair  Pain is worse with: sitting and inactivity Pain improves with: rest, heat/ice, therapy/exercise and medication Relief from Meds: 8  Mobility walk with assistance use a walker how many minutes can you walk? 5-10 ability to climb steps?  no do you drive?  no use a wheelchair needs help with transfers Do you have any goals in this area?  yes  Function not employed: date last employed . disabled: date disabled . I need assistance with the following:  dressing, bathing, meal prep, household duties and shopping Do you have any goals in this area?  yes  Neuro/Psych bladder control problems weakness numbness tingling trouble walking spasms  Prior Studies Any changes since last visit?  no    Physicians involved in your care Any changes since last visit?  no   Family History  Problem Relation Age of Onset  . Cancer Father        skin  . Heart disease Father   . Heart attack Father        heart attack in his 57's  . Thyroid disease  Sister   . Ovarian cancer Cousin   . Breast cancer Maternal Aunt 60  . Breast cancer Maternal Grandmother 27  . Bladder Cancer Neg Hx   . Kidney cancer Neg Hx    Social History   Socioeconomic History  . Marital status: Married    Spouse name: don  . Number of children: 0  . Years of education: 45  . Highest education level: Not on file  Occupational History  . Occupation: disability  Tobacco Use  . Smoking status: Never Smoker  . Smokeless tobacco: Never Used  Substance and Sexual Activity  . Alcohol use: No  . Drug use: No  . Sexual activity: Not Currently    Partners: Male  Other Topics Concern  . Not on file  Social History Narrative   She is married. Recently moved back to [place] after being in [place] for some time. She is accompanied by her husband and aunt.   Never smoked. Never used alcohol.   Social Determinants of Health   Financial Resource Strain:   . Difficulty of Paying Living Expenses:   Food Insecurity:   . Worried About Charity fundraiser in the Last Year:   . Arboriculturist in the Last Year:   Transportation Needs:   . Film/video editor (Medical):   Marland Kitchen Lack of Transportation (Non-Medical):   Physical Activity:   . Days of Exercise per Week:   .  Minutes of Exercise per Session:   Stress:   . Feeling of Stress :   Social Connections:   . Frequency of Communication with Friends and Family:   . Frequency of Social Gatherings with Friends and Family:   . Attends Religious Services:   . Active Member of Clubs or Organizations:   . Attends Archivist Meetings:   Marland Kitchen Marital Status:    Past Surgical History:  Procedure Laterality Date  . ANKLE SURGERY     Left  . ANKLE SURGERY    . ANTERIOR CERVICAL DECOMP/DISCECTOMY FUSION  11/17/2011   Procedure: ANTERIOR CERVICAL DECOMPRESSION/DISCECTOMY FUSION 2 LEVELS;  Surgeon: Erline Levine, MD;  Location: [facility];  Service: Neurosurgery;  Laterality: N/A;  Cervical Five-Six  Six-Seven Anterior cervical decompression/diskectomy/fusion  . BREAST BIOPSY Right 12-31-13   invasive mammary  . BREAST SURGERY Right 02/03/2014   Right simple mastectomy with sentinel node biopsy.  . CHOLECYSTECTOMY    . COLONOSCOPY  2014  . HYSTEROSCOPY WITH D & C N/A 11/27/2018   Procedure: DILATATION AND CURETTAGE /HYSTEROSCOPY;  Surgeon: Gae Dry, MD;  Location: [facility] ORS;  Service: Gynecology;  Laterality: N/A;  . Lower extremity venous Dopplers  Feb 27, 2013   No LE DVT  . MASTECTOMY Right 2015  . Port a cath insertion Right 01/19/2010  . PORT-A-CATH REMOVAL     right  . PORT-A-CATH REMOVAL Right 09/03/2013   Procedure: REMOVAL PORT-A-CATH;  Surgeon: Conrad [place], MD;  Location: [place];  Service: Vascular;  Laterality: Right;  . TONSILLECTOMY    . TRANSTHORACIC ECHOCARDIOGRAM  03/2013; 02/2014   a) Normal LV size and function with EF 60-65%.; Cannot exclude bicuspid aortic valve with mild AS and mild AI.; b) Normal EF with normal wall motion and valve function x Mild MR. G2 DD. EF 60-65%. Tricuspid AoV  . UPPER GI ENDOSCOPY  2014   Past Medical History:  Diagnosis Date  . Aortic valve disease    Mild AS / AI - most recent Echo demonstrated tricuspid aortic valve.  . Bacterial endocarditis    History of .  Marland Kitchen Bilateral lower extremity edema    Noncardiac.  Chronic. LE Venous dopplers - negative for DVT.; Echocardiogram January 2016: Normal EF with normal wall motion and valve function. Only grade 1 diastolic dysfunction. EF 60-65%. Mild MR  . Breast cancer ([place]) 12-31-13   Right breast, 12:00, 1.5 cm, T1c,N0 invasive mammary carcinoma, triple negative. --> Rx with Chemo  . Cervical stenosis of spine   . Herpes zoster   . IBS (irritable bowel syndrome)   . Lymphedema    [facility] legs [facility] at [facility]  . Multiple sclerosis ([place]) 2001   Walks from room to room @ home; but Wheelchair when going out.  Marland Kitchen Neuromuscular disorder ([place])    MS  . PONV (postoperative nausea and  vomiting)    Related to Fentanyl  . Seizures ([place])    Takes Keppra  . Syncope and collapse    BP 132/83   Pulse 90   Ht 5\' 9"  (1.753 m)   Wt 214 lb (97.1 kg)   SpO2 93%   BMI 31.60 kg/m   Opioid Risk Score:   Fall Risk Score:  `1  Depression screen PHQ 2/9  Depression screen [facility] 2/9 01/18/2019 10/22/2018 07/16/2018 05/23/2018 04/18/2018 12/26/2017 09/08/2017  Decreased Interest 0 0 0 0 0 0 0  Down, Depressed, Hopeless 0 0 0 0 0 0 0  PHQ - 2 Score  0 0 0 0 0 0 0  Altered sleeping 0 0 0 - 0 0 -  Tired, decreased energy 0 0 0 - 0 0 -  Change in appetite 0 0 0 - 0 0 -  Feeling bad or failure about yourself  0 0 0 - 0 0 -  Trouble concentrating 0 0 0 - 0 0 -  Moving slowly or fidgety/restless 0 0 0 - 0 0 -  Suicidal thoughts 0 0 0 - 0 0 -  PHQ-9 Score 0 0 0 - 0 0 -  Difficult doing work/chores Not difficult at all Not difficult at all Not difficult at all - Not difficult at all Not difficult at all -  Some recent data might be hidden    Review of Systems  Constitutional: Negative.   HENT: Negative.   Eyes: Negative.   Respiratory: Negative.   Cardiovascular: Positive for leg swelling.  Gastrointestinal: Negative.   Endocrine: Negative.   Genitourinary: Positive for difficulty urinating.       Has foley currently and has experienced UTIs since last seen  Musculoskeletal: Positive for arthralgias and gait problem.  Skin: Negative.   Allergic/Immunologic: Negative.   Neurological: Positive for weakness and numbness.       Tingling  Hematological: Negative.   Psychiatric/Behavioral: Negative.       Objective:   Physical Exam  Gen: NAD.  Respiratory: Unlabored. Neurological. Alert and oriented  Psych: Normal mood/behavior Neurological. Alert Fair awareness of deficits.  Motor: RUE:5/5 proximal to distal  LUE:5/5 proximal to distal RLE: 2/5 HF,3-/5 KE, 2-/5 ADF/PF  LLE: 3+/5 HF,4-/5 KE, 4-/5 ADF/PF MAS:B/l LE, limited due to positioning, but significant tone in hip  flexors R>L Skin. Chronic lymphedema lower extremities    Assessment & Plan:  59 year old right-handed female, history of chronic pain, lymphedema bilateral lower extremities, right breast cancer, chemotherapy, seizure disorder, multiple sclerosis presents for follow up for MS.   1. MS   Cont HEP, completed therapies  Cont meds   Continue follow up with Neurology  Recently fitted for KAFO  2.  Spastic paraparesis:   Cont ROM  Continue Baclofen - may discuss with Neuro increasing frequency since no side effects and having spasticity int he evening  Cont tizanidine 2mg  BID PRN, usually taking daily  Good benefit with combination  Patient would like to hold off on Botulinum toxin at this time.  3. Chronic lymphedema  Waxes and wanes  4. Gait abnormality  Cont HEP  Continue walker/wheelchair for safety, less reliance on wheelchair   Cont KAFO  5. Neurogenic bladder with spastic bladder  With recurrent UTIs  Cont meds  Cont follow up with Urology - holding off on suprapubic cath, at present  Continue indwelling foley

## 2019-06-28 ENCOUNTER — Other Ambulatory Visit: Payer: Self-pay | Admitting: *Deleted

## 2019-06-28 MED ORDER — TIZANIDINE HCL 4 MG PO TABS: 4.0000 mg | ORAL_TABLET | Freq: Two times a day (BID) | ORAL | 1 refills | Status: DC

## 2019-07-11 ENCOUNTER — Ambulatory Visit: Payer: Self-pay | Admitting: *Deleted

## 2019-07-11 NOTE — Telephone Encounter (Signed)
Nurse with home health called in.  Rebecca [name] with [facility].     Pt was getting up from  bedside commode last night and she fell forward onto  the floor.   She called EMS to get her up from the floor.  No injuries or bruises per Rebecca [name] the nurse. This nurse has not seen this pt for several weeks.   She has swelling in both of her legs.She has lymphedema in both legs so she is not sure if this is normal for her or not.    She also has Foley catheter.   She doesn't have any supplies so I have to come back next week.  Nurse came to house today to change the Foley.  Her urine is cloudy and dark looking.  No pain with Foley except an odor.  She needs order for PT for evaluation due to the fall.  No injuries from the fall last night.      [phone] is nurse's direct number.    I let her know I would send this information to Delsa Grana, PA-C.

## 2019-07-11 NOTE — Telephone Encounter (Signed)
Will you give order for PT? And I will tell them to contact her urologist about foley needs.

## 2019-07-12 NOTE — Telephone Encounter (Signed)
Verbal given 

## 2019-07-12 NOTE — Telephone Encounter (Signed)
Yes - give VO for PT and foley care and orders needs to come from urology

## 2019-07-14 ENCOUNTER — Other Ambulatory Visit: Payer: Self-pay | Admitting: Family Medicine

## 2019-07-16 ENCOUNTER — Telehealth: Payer: Self-pay

## 2019-07-16 NOTE — Telephone Encounter (Signed)
Does this need to go through her urologist?

## 2019-07-16 NOTE — Telephone Encounter (Signed)
Copied from [place] [id number]. Topic: General - Other >> Jul 16, 2019 11:57 AM Marya Landry D wrote: Reason for CRM: Patient Is unhappy with the in home service that provides assistance with the catheter, she has another service in mind that she would like to be referred to.please advise

## 2019-07-16 NOTE — Telephone Encounter (Signed)
Patients home health nurse, Cletis Athens, reports that she changed patients foley yesterday. Cletis Athens reports amber colored urine with a fowl odor. Cletis Athens reports that patient thinks she may have an infection and also noted that the patient has fallen 2 times in the last week.   I spoke with the patient regarding her symptoms. Patient reports that [place] did not replace the foley correctly and she had urine leaking all night long. Additionally she called EMS and was going to get them to take her to the ED to place a new foley however EMS informed her that the ED had over 60 patients and it was likely that she would be low priority. Patient decided to wait until today for another home health nurse visit to fix the catheter issue. Patient reports "apple juice colored" and malodorous urine that has been present for a while now. Patient reports frequent sediment and using Renacidin regularly.  Patient denies fever, chills, nausea, vomiting, abdominal pain or any gross hematuria. Please advise.

## 2019-07-16 NOTE — Telephone Encounter (Signed)
Please counsel the patient that I fully expect her urine is colonized with bacteria associated with her chronic indwelling Foley catheter.  Urinary colonization does not require antibiotic therapy, as it does not represent a true infection.  Symptoms of true urinary infection in this case would include lower abdominal pain, low back pain, fever, chills, nausea, vomiting, and altered mental status.  In the absence of the symptoms, I do not recommend therapy.  Discolored and malodorous urine in this case are not symptoms of urinary tract infection.  I recommend she increase her fluid intake to dilute her urine.  Additionally, she can consider changing her drainage bags more frequently to reduce odor.

## 2019-07-17 NOTE — Telephone Encounter (Signed)
Counseled patient as advised. Suggested patient increase fluid intake to dilute concentration and suggested for her to consider changing her bags more frequently for odor. Patient states she does not show symptoms due to her MS.

## 2019-07-17 NOTE — Telephone Encounter (Signed)
Pt notified, to talk with urologist

## 2019-07-19 ENCOUNTER — Ambulatory Visit: Payer: Medicare Other | Admitting: Family Medicine

## 2019-07-19 ENCOUNTER — Ambulatory Visit: Payer: Medicare Other

## 2019-07-22 ENCOUNTER — Telehealth: Payer: Self-pay | Admitting: Diagnostic Neuroimaging

## 2019-07-22 NOTE — Telephone Encounter (Signed)
Called patient who stated she's having issues with color and odor of her urine. She has indwelling foley cathter. She's drinking water all the time, cut out salt and other foods they told her. She stated that she doesn't feel typical symptoms of infection due to her MS. She's asking for Dr Gladstone Lighter recommendations. Would like referral to urologist that may specialize in [place] patients with urinary issues. I advised her will let Dr Leta Baptist know, and she should discuss referral with PCP. Patient verbalized understanding, appreciation.

## 2019-07-22 NOTE — Telephone Encounter (Signed)
Pt called wanting to know if she can be referred to a Urologist here in [place] so that she can get a second opinion. Please advise.

## 2019-07-22 NOTE — Telephone Encounter (Signed)
Agree. -VRP 

## 2019-07-23 ENCOUNTER — Inpatient Hospital Stay: Payer: Medicare Other | Attending: Oncology

## 2019-07-23 ENCOUNTER — Other Ambulatory Visit: Payer: Self-pay

## 2019-07-23 DIAGNOSIS — Z8041 Family history of malignant neoplasm of ovary: Secondary | ICD-10-CM | POA: Diagnosis not present

## 2019-07-23 DIAGNOSIS — I890 Lymphedema, not elsewhere classified: Secondary | ICD-10-CM | POA: Diagnosis not present

## 2019-07-23 DIAGNOSIS — Z452 Encounter for adjustment and management of vascular access device: Secondary | ICD-10-CM | POA: Diagnosis not present

## 2019-07-23 DIAGNOSIS — G35 Multiple sclerosis: Secondary | ICD-10-CM | POA: Diagnosis not present

## 2019-07-23 DIAGNOSIS — Z9011 Acquired absence of right breast and nipple: Secondary | ICD-10-CM | POA: Diagnosis not present

## 2019-07-23 DIAGNOSIS — Z853 Personal history of malignant neoplasm of breast: Secondary | ICD-10-CM | POA: Insufficient documentation

## 2019-07-23 DIAGNOSIS — Z803 Family history of malignant neoplasm of breast: Secondary | ICD-10-CM | POA: Diagnosis not present

## 2019-07-23 DIAGNOSIS — Z9221 Personal history of antineoplastic chemotherapy: Secondary | ICD-10-CM | POA: Diagnosis not present

## 2019-07-23 DIAGNOSIS — Z95828 Presence of other vascular implants and grafts: Secondary | ICD-10-CM

## 2019-07-23 DIAGNOSIS — G629 Polyneuropathy, unspecified: Secondary | ICD-10-CM | POA: Insufficient documentation

## 2019-07-23 MED ORDER — HEPARIN SOD (PORK) LOCK FLUSH 100 UNIT/ML IV SOLN
INTRAVENOUS | Status: AC
  Filled 2019-07-23: qty 5

## 2019-07-23 MED ORDER — HEPARIN SOD (PORK) LOCK FLUSH 100 UNIT/ML IV SOLN
500.0000 [IU] | Freq: Once | INTRAVENOUS | Status: AC
  Administered 2019-07-23: 500 [IU] via INTRAVENOUS
  Filled 2019-07-23: qty 5

## 2019-07-23 MED ORDER — SODIUM CHLORIDE 0.9% FLUSH
10.0000 mL | Freq: Once | INTRAVENOUS | Status: AC
  Administered 2019-07-23: 10 mL via INTRAVENOUS
  Filled 2019-07-23: qty 10

## 2019-07-30 ENCOUNTER — Encounter: Payer: Self-pay | Admitting: Family Medicine

## 2019-07-30 DIAGNOSIS — N319 Neuromuscular dysfunction of bladder, unspecified: Secondary | ICD-10-CM

## 2019-07-30 DIAGNOSIS — N390 Urinary tract infection, site not specified: Secondary | ICD-10-CM

## 2019-07-30 DIAGNOSIS — G35 Multiple sclerosis: Secondary | ICD-10-CM

## 2019-07-31 ENCOUNTER — Other Ambulatory Visit: Payer: Self-pay

## 2019-07-31 ENCOUNTER — Encounter: Payer: Self-pay | Admitting: Family Medicine

## 2019-07-31 ENCOUNTER — Ambulatory Visit (INDEPENDENT_AMBULATORY_CARE_PROVIDER_SITE_OTHER): Payer: Medicare Other | Admitting: Family Medicine

## 2019-07-31 VITALS — BP 130/88 | HR 75 | Temp 98.5°F | Resp 16

## 2019-07-31 DIAGNOSIS — E785 Hyperlipidemia, unspecified: Secondary | ICD-10-CM

## 2019-07-31 DIAGNOSIS — R269 Unspecified abnormalities of gait and mobility: Secondary | ICD-10-CM | POA: Diagnosis not present

## 2019-07-31 DIAGNOSIS — G40909 Epilepsy, unspecified, not intractable, without status epilepticus: Secondary | ICD-10-CM

## 2019-07-31 DIAGNOSIS — R262 Difficulty in walking, not elsewhere classified: Secondary | ICD-10-CM

## 2019-07-31 DIAGNOSIS — R030 Elevated blood-pressure reading, without diagnosis of hypertension: Secondary | ICD-10-CM

## 2019-07-31 DIAGNOSIS — G35 Multiple sclerosis: Secondary | ICD-10-CM

## 2019-07-31 DIAGNOSIS — Z741 Need for assistance with personal care: Secondary | ICD-10-CM | POA: Diagnosis not present

## 2019-07-31 DIAGNOSIS — N319 Neuromuscular dysfunction of bladder, unspecified: Secondary | ICD-10-CM

## 2019-07-31 DIAGNOSIS — D709 Neutropenia, unspecified: Secondary | ICD-10-CM

## 2019-07-31 DIAGNOSIS — D696 Thrombocytopenia, unspecified: Secondary | ICD-10-CM

## 2019-07-31 DIAGNOSIS — J309 Allergic rhinitis, unspecified: Secondary | ICD-10-CM

## 2019-07-31 DIAGNOSIS — R21 Rash and other nonspecific skin eruption: Secondary | ICD-10-CM

## 2019-07-31 DIAGNOSIS — G8220 Paraplegia, unspecified: Secondary | ICD-10-CM | POA: Diagnosis not present

## 2019-07-31 DIAGNOSIS — R8290 Unspecified abnormal findings in urine: Secondary | ICD-10-CM

## 2019-07-31 DIAGNOSIS — Z978 Presence of other specified devices: Secondary | ICD-10-CM

## 2019-07-31 MED ORDER — NYSTATIN 100000 UNIT/GM EX POWD: 1.0000 "application " | Freq: Three times a day (TID) | CUTANEOUS | 2 refills | Status: AC | PRN

## 2019-07-31 MED ORDER — LORATADINE 10 MG PO TABS: 10.0000 mg | ORAL_TABLET | Freq: Every day | ORAL | 3 refills | Status: DC

## 2019-07-31 NOTE — Progress Notes (Signed)
Name: Rebecca [name]   MRN: [id number]    DOB: [birth year]   Date:07/31/2019       Progress Note  Chief Complaint  Patient presents with  . Follow-up  . Multiple Sclerosis    would like a referral to different urologist to manage bladder care     Subjective:   Rebecca [name] is a 59 y.o. female, presents to clinic for routine f/up, has concerns about foley/urology, rash, and has a lot of home supply, HH q's and refill questions   Supplies for amedysis  For foley, clips/bags - managed through urology, and she has home health nurses coming out to help her manage that they previously done wound care, she also has someone coming out to measure her legs for new compression stockings.  She is up for recertification.  She does report that she is due for some order and supplies and on some expensive medications which were prescribed by urology -we reviewed her chart and med list today  [facility] nurse comes out tomorrow, she was having foley changed out every few weeks and a new [place] nurse has been coming and foley changes seem to last longer   Due for refill on allergy medications and she also requests refill on ibuprofen but this was recently sent in-she uses allergy medications and ibuprofen as needed  Her main concern recently has been concentrated dark urine with odor.  She is worried that they are not doing urine culture testing to see if there is infection.    MS - managed by Dr. Posey Pronto and neurology, she is on baclofen during the daytime and recently started tizanidine at bedtime Also on amantadine, Keppra and interferon for managing MS  She has had recurrent rash that appears on her buttock sometimes is little clusters of red raised bumps sometimes becomes more red and raw coalesced area she used to have some powder and cream to treat it but does not currently have anything would like to see if she can get refills.  LE edema and swelling - has gotten worse patient reports history of lymphedema.   She does have old compression stockings which were prescription but they are now too tight, somebody is already coming out to the house to measure to reorder    Current Outpatient Medications:  .  amantadine (SYMMETREL) 100 MG capsule, Take 1 capsule (100 mg total) by mouth 3 (three) times daily., Disp: 270 capsule, Rfl: 4 .  baclofen (LIORESAL) 10 MG tablet, TAKE 1 TO 2 TABLETS BY MOUTH 3 TIMES A DAY (Patient taking differently: Take 10-20 mg by mouth See admin instructions. Take 2 tablets (20 mg) by mouth in the morning, 1 tablet (10 mg) by mouth at lunch, & 2 tablets (20 mg) by mouth at dinner.), Disp: 540 tablet, Rfl: 2 .  Cranberry 1000 MG CAPS, Take by mouth., Disp: , Rfl:  .  Disposable Gloves (ASSURANCE VINYL EXAM GLOVES) MISC, Neurogenic bladder; MS; LON 99 months; for use for personal hygiene, Disp: 50 each, Rfl: 11 .  fluticasone (FLONASE) 50 MCG/ACT nasal spray, SPRAY 2 SPRAYS INTO EACH NOSTRIL EVERY DAY, Disp: 48 mL, Rfl: 3 .  Glucosamine-Chondroitin (COSAMIN DS PO), Take 1 tablet by mouth 2 (two) times daily., Disp: , Rfl:  .  ibuprofen (ADVIL) 600 MG tablet, TAKE 1 TABLET (600 MG TOTAL) BY MOUTH EVERY 8 (EIGHT) HOURS AS NEEDED., Disp: 90 tablet, Rfl: 3 .  Interferon Beta-1a (REBIF REBIDOSE) 44 MCG/0.5ML SOAJ, Inject 0.5 mLs into the  skin every Monday, Wednesday, and Friday. (Patient taking differently: Inject 44 mcg into the skin every Monday, Wednesday, and Friday. ), Disp: 12 Syringe, Rfl: 11 .  levETIRAcetam (KEPPRA) 500 MG tablet, Take 1 tablet (500 mg total) by mouth 2 (two) times daily., Disp: 180 tablet, Rfl: 4 .  loratadine (CLARITIN) 10 MG tablet, Take 10 mg by mouth daily., Disp: , Rfl:  .  Misc Natural Products (LEG VEIN & CIRCULATION) TABS, Take 1 tablet by mouth 2 (two) times daily. , Disp: , Rfl:  .  Multiple Vitamins-Minerals (HAIR SKIN AND NAILS FORMULA) TABS, Take 1 tablet by mouth 2 (two) times daily. , Disp: , Rfl:  .  Multiple Vitamins-Minerals (MULTIVITAMIN PO),  Take 1 tablet by mouth daily. , Disp: , Rfl:  .  oxybutynin (DITROPAN-XL) 10 MG 24 hr tablet, Take 1 tablet (10 mg total) by mouth 2 (two) times daily., Disp: 180 tablet, Rfl: 4 .  tiZANidine (ZANAFLEX) 4 MG tablet, Take 1 tablet (4 mg total) by mouth 2 (two) times daily., Disp: 90 tablet, Rfl: 1 .  Turmeric 500 MG CAPS, Take 500 mg by mouth daily., Disp: , Rfl:     Patient Active Problem List   Diagnosis Date Noted  . Muscle spasm 02/28/2019  . Post-menopausal bleeding 11/20/2018  . Ambulatory dysfunction 02/02/2018  . Abnormality of gait 11/17/2017  . 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 Bilateral lower extremity pain (primary) (bilateral) (right greater than left) 08/01/2016  . Chronic neck pain (secondary) (bilateral) ( left greater than right) 07/28/2016  . Long term current use of opiate analgesic 07/28/2016  . Long term prescription opiate use 07/28/2016  . Opiate use 07/28/2016  . Neutropenia ([place]) 06/27/2016  . Chronic pain syndrome 06/03/2016  . Adjustment disorder with mixed anxiety and depressed mood   . Neurogenic bladder   . Neurogenic bowel   . Detrusor and sphincter dyssynergia   . Urinary incontinence   . Seizure disorder ([place])   . Slow transit constipation   . 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[place])   . Lymphedema   . Hypoalbuminemia due to protein-calorie malnutrition ([place])   . Encounter for screening for cervical cancer  11/27/2015  . Preventative health care 11/25/2015  . History of breast cancer in female 10/28/2015  . Anemia 10/28/2015  . Thrombocytopenia ([place]) 10/28/2015  . Allergic rhinitis 10/28/2015  . IBS (irritable bowel syndrome) 10/28/2015  . 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 History of right mastectomy 10/24/2014  . Medicare annual wellness visit, subsequent 10/24/2014  . MS (multiple sclerosis) ([place]) 09/16/2014  . Hematoma complicating a procedure 03/06/2014  . Primary cancer of right female breast ([place]) 01/10/2014  . SOB  (shortness of breath) 03/01/2013  . Bilateral lower extremity edema   . 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 ANKLE SURGERY     Left  . ANKLE SURGERY    . ANTERIOR CERVICAL DECOMP/DISCECTOMY FUSION  11/17/2011   Procedure: ANTERIOR CERVICAL DECOMPRESSION/DISCECTOMY FUSION 2 LEVELS;  Surgeon: Erline Levine, MD;  Location: [facility];  Service: Neurosurgery;  Laterality: N/A;  Cervical Five-Six Six-Seven Anterior cervical decompression/diskectomy/fusion  . BREAST BIOPSY Right 12-31-13   invasive mammary  . BREAST SURGERY Right 02/03/2014   Right simple mastectomy with sentinel node biopsy.  . CHOLECYSTECTOMY    . COLONOSCOPY  2014  . HYSTEROSCOPY WITH D & C N/A 11/27/2018   Procedure: DILATATION AND CURETTAGE /HYSTEROSCOPY;  Surgeon: Gae Dry,  MD;  Location: [facility];  Service: Gynecology;  Laterality: N/A;  . Lower extremity venous Dopplers  Feb 27, 2013   No LE DVT  . MASTECTOMY Right 2015  . Port a cath insertion Right 01/19/2010  . PORT-A-CATH REMOVAL     right  . PORT-A-CATH REMOVAL Right 09/03/2013   Procedure: REMOVAL PORT-A-CATH;  Surgeon: Conrad Greenbush, MD;  Location: [facility];  Service: Vascular;  Laterality: Right;  . TONSILLECTOMY    . TRANSTHORACIC ECHOCARDIOGRAM  03/2013; 02/2014   a) Normal LV size and function with EF 60-65%.; Cannot exclude bicuspid aortic valve with mild AS and mild AI.; b) Normal EF with normal wall motion and valve function x Mild MR. G2 DD. EF 60-65%. Tricuspid AoV  . UPPER GI ENDOSCOPY  2014    Family History  Problem Relation Age of Onset  . Cancer Father        skin  . Heart disease Father   . Heart attack Father        heart attack in his 54's  . Thyroid disease Sister   . Ovarian cancer Cousin   . Breast cancer Maternal Aunt 60  . Breast cancer Maternal Grandmother 10  . Bladder Cancer Neg Hx   . Kidney cancer Neg Hx     Social History   Tobacco Use  .  Smoking status: Never Smoker  . Smokeless tobacco: Never Used  Vaping Use  . Vaping Use: Never used  Substance Use Topics  . Alcohol use: No  . Drug use: No     Allergies  Allergen Reactions  . Fentanyl Nausea And Vomiting and Nausea Only    vomiting Was given in PACU x3 each time patient got sick vomiting Was given in PACU x3 each time patient got sick  . Sulfa Antibiotics Hives and Other (See Comments)    Light headed, over heated    Health Maintenance  Topic Date Due  . TETANUS/TDAP  Never done  . COVID-19 Vaccine (2 - Moderna 2-dose series) 07/30/2019  . MAMMOGRAM  09/06/2019  . INFLUENZA VACCINE  09/08/2019  . PAP SMEAR-Modifier  09/25/2021  . COLONOSCOPY  02/07/2022  . Hepatitis C Screening  Completed  . HIV Screening  Completed    Chart Review Today: I personally reviewed active problem list, medication list, allergies, family history, social history, health maintenance, notes from last encounter, lab results, imaging with the patient/caregiver today.   Review of Systems   10 Systems reviewed and are negative for acute change except as noted in the HPI.   Objective:   Vitals:   07/31/19 1108  BP: 130/88  Pulse: 75  Resp: 16  Temp: 98.5 F (36.9 C)  TempSrc: Temporal  SpO2: 99%    There is no height or weight on file to calculate BMI.  Physical Exam Vitals and nursing note reviewed.  Constitutional:      General: She is not in acute distress.    Appearance: She is obese. She is not toxic-appearing or diaphoretic. Ill appearance: chronically ill appearing female.     Comments: Urine odor hygeine appears good  HENT:     Head: Normocephalic and atraumatic.     Right Ear: External ear normal.     Left Ear: External ear normal.  Eyes:     General: No scleral icterus.       Right eye: No discharge.        Left eye: No discharge.  Cardiovascular:     Rate  and Rhythm: Normal rate and regular rhythm.     Pulses:          Radial pulses are 2+ on  the right side and 2+ on the left side.     Comments: Non-pitting LE edema bilaterally Pulmonary:     Effort: Pulmonary effort is normal. No respiratory distress.     Breath sounds: Normal breath sounds.  Abdominal:     General: Bowel sounds are normal. There is no distension.     Palpations: Abdomen is soft.     Tenderness: There is no abdominal tenderness. There is no guarding or rebound.  Musculoskeletal:     Right lower leg: Edema present.     Left lower leg: Edema present.  Neurological:     Mental Status: She is alert.     Comments: In WC Normal movement coordination of b/l UE  Psychiatric:        Mood and Affect: Mood normal.        Behavior: Behavior normal. Behavior is cooperative.         Assessment & Plan:       ICD-10-CM   1. Ambulatory dysfunction  R26.2    Chronic, no worsening, per neurology managing MS and seizure disorder, home health recert coming up  2. Requires assistance with activities of daily living (ADL)  M35.5    Up for recertification of home health  3. Abnormality of gait  R26.9    Wheelchair-bound, sees neurology, has home health  4. 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 Allergic rhinitis, unspecified seasonality, unspecified trigger  J30.9 loratadine (CLARITIN) 10 MG tablet   Refill on antihistamine and no spray suggest daily use during worse allergy seasons  6. Hyperlipidemia, unspecified hyperlipidemia type  E78.5    ldl high, labs done Dec with LDL 113, was improving compared to last labs from 2017, ASCVD risk is 2.8%, recheck annually  The 10-year ASCVD risk score Mikey Bussing DC Brooke Bonito., et al., 2013) is: 2.8%   Values used to calculate the score:     Age: 34 years     Sex: Female     Is Non-Hispanic African American: No     Diabetic: No     Tobacco smoker: No     Systolic Blood Pressure: 974 mmHg     Is BP treated: No     HDL Cholesterol: 62 mg/dL     Total Cholesterol: 197 mg/dL      7. Foul smelling urine  R82.90 Ambulatory referral to Urogynecology   Odor previously associated with UTIs, symptoms include odor and change in color, urine culture ordered with home health-collect sample after Foley change  8. Neurogenic bladder  N31.9 Ambulatory referral to Urogynecology   Per urology -patient would like a second opinion  9. Chronic indwelling Foley catheter  Z97.8 Ambulatory referral to Urogynecology   Concerns of recurrent odor and dark urine which previously was associated with UTIs, reviewed las urine culture multiple microbes, recollect clean-catch   10. Rash and nonspecific skin eruption  R21    Likely as skin yeast or fungal infections nystatin powder prescribed encouraged patient to keep skin dry, aerated, use as needed follow-up if any worsening  11. Blood pressure elevated without history of HTN  R03.0    High blood pressure readings at home with a wrist BP cuff readings here are good, not currently concerned about starting BP meds, continue to monitor  12. Neutropenia, unspecified type ([place]) Chronic D70.9    WBC just outside low normal -we will continue to monitor, currently no concerns  13. Seizure disorder [facility]) Chronic G40.909    Per neurology, Dr. Posey Pronto -recent office visit reviewed, med changes reviewed today and chart updated  14. Thrombocytopenia ([place]) Chronic D69.6    Last labs showed normal platelets     Winnifred Friar, CMA called [facility] nurse for urine culture odor - instructed to collect after changing out foley - clean catch  Patient has multiple questions about supplies -encouraged them to discuss a lot of their questions and concerns with the home health nurses who manage the Foley, some of their medications they need refilled do require they contact their specialist so they are encouraged to follow-up with her current urologist for refills the required prior authorization and were previously very expensive.  Encouraged them to stay established with them  until they have a urogynecology appointment and desire to transition to a different practice.  I do think it would be beneficial to have extra supplies for additional leg bags and a routine for maintenance of Foley supplies and bags.  Odor may be from using the same bag for extended periods of time, it may not be from any acute UTI, I did explain to them how she will be colonized with bacteria with her indwelling catheter and does not require treatment.  Some of the other supplies at other specialist are ordering may require PCP signature explained this to them to communicate with [place] regarding their needs especially for medical supplies, things that are nonprescription but may be covered by some other insurance benefits, or things that may require PCP signature though there evaluated and ordered by specialist but require PCP signature for insurance purposes.  Greater than 50% of this visit was spent in direct face-to-face counseling, obtaining history and physical, discussing and educating pt on treatment plan.  Total time of this visit was 45+ min.  Remainder of time involved but was not limited to reviewing chart (recent and pertinent OV notes and labs), documentation in EMR, and coordinating care and treatment plan.    Return in about 6 months (around 01/30/2020) for Routine follow-up.   Delsa Grana, PA-C 07/31/19 11:29 AM

## 2019-07-31 NOTE — Patient Instructions (Addendum)
Hibiclens - antiseptic skin cleanser - can also use dial soap  Ask about getting an additional leg bag and ask about ways to clean the leg backs cause that is a large source of odor if you do not have a routine for swapping out bags and cleaning supplies  Let me know if you need an order or help getting addition supplies

## 2019-08-05 NOTE — Telephone Encounter (Signed)
Copied from [place] [id number]. Topic: Referral - Request for Referral >> Aug 05, 2019  1:33 PM Hinda Lenis D wrote: Has patient seen PCP for this complaint? yes *If NO, is insurance requiring patient see PCP for this issue before PCP can refer them? Referral for which specialty: urology  Preferred provider/office: none specified, in network  Reason for referral: recurrent UTI/ foul smelling urine

## 2019-08-06 ENCOUNTER — Ambulatory Visit: Payer: Medicare Other | Admitting: Physician Assistant

## 2019-08-13 ENCOUNTER — Ambulatory Visit (INDEPENDENT_AMBULATORY_CARE_PROVIDER_SITE_OTHER): Payer: Medicare Other

## 2019-08-13 ENCOUNTER — Other Ambulatory Visit: Payer: Self-pay

## 2019-08-13 VITALS — Ht 69.0 in | Wt 214.0 lb

## 2019-08-13 DIAGNOSIS — Z0100 Encounter for examination of eyes and vision without abnormal findings: Secondary | ICD-10-CM | POA: Diagnosis not present

## 2019-08-13 DIAGNOSIS — Z0000 Encounter for general adult medical examination without abnormal findings: Secondary | ICD-10-CM | POA: Diagnosis not present

## 2019-08-13 NOTE — Patient Instructions (Signed)
Rebecca [name] , Thank you for taking time to come for your Medicare Wellness Visit. I appreciate your ongoing commitment to your health goals. Please review the following plan we discussed and let me know if I can assist you in the future.   Screening recommendations/referrals: Colonoscopy: done 09/30/12. Repeat in 2024 Mammogram: done 09/06/18. Scheduled for 09/09/19 Bone Density: due at age 59 Recommended yearly ophthalmology/optometry visit for glaucoma screening and checkup Recommended yearly dental visit for hygiene and checkup  Vaccinations: Influenza vaccine: done 11/20/18 Pneumococcal vaccine: done 02/03/18 Tdap vaccine: done 04/09/12 Shingles vaccine: please discuss Shingrix with Delsa Grana [facility] or oncologist   Covid-19: done 07/02/19 & 07/31/19   Conditions/risks identified: Recommend preventing falls by continuing to work with physical therapy for balance and gait training  Next appointment: Follow up in one year for your annual wellness visit.   Preventive Care 40-64 Years, Female Preventive care refers to lifestyle choices and visits with your health care provider that can promote health and wellness. What does preventive care include?  A yearly physical exam. This is also called an annual well check.  Dental exams once or twice a year.  Routine eye exams. Ask your health care provider how often you should have your eyes checked.  Personal lifestyle choices, including:  Daily care of your teeth and gums.  Regular physical activity.  Eating a healthy diet.  Avoiding tobacco and drug use.  Limiting alcohol use.  Practicing safe sex.  Taking low-dose aspirin daily starting at age 7.  Taking vitamin and mineral supplements as recommended by your health care provider. What happens during an annual well check? The services and screenings done by your health care provider during your annual well check will depend on your age, overall health, lifestyle risk factors, and  family history of disease. Counseling  Your health care provider may ask you questions about your:  Alcohol use.  Tobacco use.  Drug use.  Emotional well-being.  Home and relationship well-being.  Sexual activity.  Eating habits.  Work and work Statistician.  Method of birth control.  Menstrual cycle.  Pregnancy history. Screening  You may have the following tests or measurements:  Height, weight, and BMI.  Blood pressure.  Lipid and cholesterol levels. These may be checked every 5 years, or more frequently if you are over 43 years old.  Skin check.  Lung cancer screening. You may have this screening every year starting at age 32 if you have a 30-pack-year history of smoking and currently smoke or have quit within the past 15 years.  Fecal occult blood test (FOBT) of the stool. You may have this test every year starting at age 61.  Flexible sigmoidoscopy or colonoscopy. You may have a sigmoidoscopy every 5 years or a colonoscopy every 10 years starting at age 37.  Hepatitis C blood test.  Hepatitis B blood test.  Sexually transmitted disease (STD) testing.  Diabetes screening. This is done by checking your blood sugar (glucose) after you have not eaten for a while (fasting). You may have this done every 1-3 years.  Mammogram. This may be done every 1-2 years. Talk to your health care provider about when you should start having regular mammograms. This may depend on whether you have a family history of breast cancer.  BRCA-related cancer screening. This may be done if you have a family history of breast, ovarian, tubal, or peritoneal cancers.  Pelvic exam and Pap test. This may be done every 3 years starting at age  21. Starting at age 93, this may be done every 5 years if you have a Pap test in combination with an HPV test.  Bone density scan. This is done to screen for osteoporosis. You may have this scan if you are at high risk for osteoporosis. Discuss your  test results, treatment options, and if necessary, the need for more tests with your health care provider. Vaccines  Your health care provider may recommend certain vaccines, such as:  Influenza vaccine. This is recommended every year.  Tetanus, diphtheria, and acellular pertussis (Tdap, Td) vaccine. You may need a Td booster every 10 years.  Zoster vaccine. You may need this after age 31.  Pneumococcal 13-valent conjugate (PCV13) vaccine. You may need this if you have certain conditions and were not previously vaccinated.  Pneumococcal polysaccharide (PPSV23) vaccine. You may need one or two doses if you smoke cigarettes or if you have certain conditions. Talk to your health care provider about which screenings and vaccines you need and how often you need them. This information is not intended to replace advice given to you by your health care provider. Make sure you discuss any questions you have with your health care provider. Document Released: 02/20/2015 Document Revised: 10/14/2015 Document Reviewed: 11/25/2014 Elsevier Interactive Patient Education  2017 [employer] Prevention in the Home Falls can cause injuries. They can happen to people of all ages. There are many things you can do to make your home safe and to help prevent falls. What can I do on the outside of my home?  Regularly fix the edges of walkways and driveways and fix any cracks.  Remove anything that might make you trip as you walk through a door, such as a raised step or threshold.  Trim any bushes or trees on the path to your home.  Use bright outdoor lighting.  Clear any walking paths of anything that might make someone trip, such as rocks or tools.  Regularly check to see if handrails are loose or broken. Make sure that both sides of any steps have handrails.  Any raised decks and porches should have guardrails on the edges.  Have any leaves, snow, or ice cleared regularly.  Use sand or  salt on walking paths during winter.  Clean up any spills in your garage right away. This includes oil or grease spills. What can I do in the bathroom?  Use night lights.  Install grab bars by the toilet and in the tub and shower. Do not use towel bars as grab bars.  Use non-skid mats or decals in the tub or shower.  If you need to sit down in the shower, use a plastic, non-slip stool.  Keep the floor dry. Clean up any water that spills on the floor as soon as it happens.  Remove soap buildup in the tub or shower regularly.  Attach bath mats securely with double-sided non-slip rug tape.  Do not have throw rugs and other things on the floor that can make you trip. What can I do in the bedroom?  Use night lights.  Make sure that you have a light by your bed that is easy to reach.  Do not use any sheets or blankets that are too big for your bed. They should not hang down onto the floor.  Have a firm chair that has side arms. You can use this for support while you get dressed.  Do not have throw rugs and other things on  the floor that can make you trip. What can I do in the kitchen?  Clean up any spills right away.  Avoid walking on wet floors.  Keep items that you use a lot in easy-to-reach places.  If you need to reach something above you, use a strong step stool that has a grab bar.  Keep electrical cords out of the way.  Do not use floor polish or wax that makes floors slippery. If you must use wax, use non-skid floor wax.  Do not have throw rugs and other things on the floor that can make you trip. What can I do with my stairs?  Do not leave any items on the stairs.  Make sure that there are handrails on both sides of the stairs and use them. Fix handrails that are broken or loose. Make sure that handrails are as long as the stairways.  Check any carpeting to make sure that it is firmly attached to the stairs. Fix any carpet that is loose or worn.  Avoid having  throw rugs at the top or bottom of the stairs. If you do have throw rugs, attach them to the floor with carpet tape.  Make sure that you have a light switch at the top of the stairs and the bottom of the stairs. If you do not have them, ask someone to add them for you. What else can I do to help prevent falls?  Wear shoes that:  Do not have high heels.  Have rubber bottoms.  Are comfortable and fit you well.  Are closed at the toe. Do not wear sandals.  If you use a stepladder:  Make sure that it is fully opened. Do not climb a closed stepladder.  Make sure that both sides of the stepladder are locked into place.  Ask someone to hold it for you, if possible.  Clearly mark and make sure that you can see:  Any grab bars or handrails.  First and last steps.  Where the edge of each step is.  Use tools that help you move around (mobility aids) if they are needed. These include:  Canes.  Walkers.  Scooters.  Crutches.  Turn on the lights when you go into a dark area. Replace any light bulbs as soon as they burn out.  Set up your furniture so you have a clear path. Avoid moving your furniture around.  If any of your floors are uneven, fix them.  If there are any pets around you, be aware of where they are.  Review your medicines with your doctor. Some medicines can make you feel dizzy. This can increase your chance of falling. Ask your doctor what other things that you can do to help prevent falls. This information is not intended to replace advice given to you by your health care provider. Make sure you discuss any questions you have with your health care provider. Document Released: 11/20/2008 Document Revised: 07/02/2015 Document Reviewed: 02/28/2014 Elsevier Interactive Patient Education  2017 [employer].

## 2019-08-13 NOTE — Progress Notes (Signed)
Subjective:   Rebecca [name] is a 59 y.o. female who presents for Medicare Annual (Subsequent) preventive examination.  Virtual Visit via Telephone Note  I connected with  Rebecca [name] on 08/13/19 at  2:10 PM EDT by telephone and verified that I am speaking with the correct person using two identifiers.  Medicare Annual Wellness visit completed telephonically due to Covid-19 pandemic.   Location: Patient: home Provider: office   I discussed the limitations, risks, security and privacy concerns of performing an evaluation and management service by telephone and the availability of in person appointments. The patient expressed understanding and agreed to proceed.  Unable to perform video visit due to video visit attempted and failed and/or patient does not have video capability.   Some vital signs may be absent or patient reported.   Clemetine Marker, LPN    Review of Systems     Cardiac Risk Factors include: advanced age (>68men, >88 women);sedentary lifestyle;obesity (BMI >30kg/m2)     Objective:    Today's Vitals   08/13/19 1432  Weight: 214 lb (97.1 kg)  Height: 5\' 9"  (1.753 m)   Body mass index is 31.6 kg/m.  Advanced Directives 06/05/2019 05/22/2019 05/02/2019 11/21/2018 09/07/2018 08/22/2018 06/08/2018  Does Patient Have a Medical Advance Directive? No No No No No No No  Does patient want to make changes to medical advance directive? - - - - - - -  Copy of [place] in Chart? - - - - - - -  Would patient like information on creating a medical advance directive? No - Patient declined - No - Patient declined No - Patient declined - - No - Patient declined  Pre-existing out of facility DNR order (yellow form or pink MOST form) - - - - - - -    Current Medications (verified) Outpatient Encounter Medications as of 08/13/2019  Medication Sig  . amantadine (SYMMETREL) 100 MG capsule Take 1 capsule (100 mg total) by mouth 3 (three) times daily.  . baclofen  (LIORESAL) 10 MG tablet TAKE 1 TO 2 TABLETS BY MOUTH 3 TIMES A DAY (Patient taking differently: Take 10-20 mg by mouth See admin instructions. Take 2 tablets (20 mg) by mouth in the morning, 1 tablet (10 mg) by mouth at lunch, & 2 tablets (20 mg) by mouth at dinner.)  . Cranberry 1000 MG CAPS Take by mouth.  . Disposable Gloves (ASSURANCE VINYL EXAM GLOVES) MISC Neurogenic bladder; MS; LON 99 months; for use for personal hygiene  . fluticasone (FLONASE) 50 MCG/ACT nasal spray SPRAY 2 SPRAYS INTO EACH NOSTRIL EVERY DAY  . Glucosamine-Chondroitin (COSAMIN DS PO) Take 1 tablet by mouth 2 (two) times daily.  Marland Kitchen ibuprofen (ADVIL) 600 MG tablet TAKE 1 TABLET (600 MG TOTAL) BY MOUTH EVERY 8 (EIGHT) HOURS AS NEEDED.  Marland Kitchen Interferon Beta-1a (REBIF REBIDOSE) 44 MCG/0.5ML SOAJ Inject 0.5 mLs into the skin every Monday, Wednesday, and Friday. (Patient taking differently: Inject 44 mcg into the skin every Monday, Wednesday, and Friday. )  . levETIRAcetam (KEPPRA) 500 MG tablet Take 1 tablet (500 mg total) by mouth 2 (two) times daily.  Marland Kitchen loratadine (CLARITIN) 10 MG tablet Take 1 tablet (10 mg total) by mouth daily.  . Misc Natural Products (LEG VEIN & CIRCULATION) TABS Take 1 tablet by mouth 2 (two) times daily.   . Multiple Vitamins-Minerals (HAIR SKIN AND NAILS FORMULA) TABS Take 1 tablet by mouth 2 (two) times daily.   . Multiple Vitamins-Minerals (MULTIVITAMIN PO) Take 1  tablet by mouth daily.   Marland Kitchen nystatin (MYCOSTATIN/NYSTOP) powder Apply 1 application topically 3 (three) times daily as needed. For rash/raw skin as needed  . tiZANidine (ZANAFLEX) 4 MG tablet Take 1 tablet (4 mg total) by mouth 2 (two) times daily.  . Turmeric 500 MG CAPS Take 500 mg by mouth daily.  Marland Kitchen oxybutynin (DITROPAN-XL) 10 MG 24 hr tablet Take 1 tablet (10 mg total) by mouth 2 (two) times daily.   No facility-administered encounter medications on file as of 08/13/2019.    Allergies (verified) Fentanyl and Sulfa antibiotics    History: Past Medical History:  Diagnosis Date  . Allergy   . Aortic valve disease    Mild AS / AI - most recent Echo demonstrated tricuspid aortic valve.  . Bacterial endocarditis    History of .  Marland Kitchen Bilateral lower extremity edema    Noncardiac.  Chronic. LE Venous dopplers - negative for DVT.; Echocardiogram January 2016: Normal EF with normal wall motion and valve function. Only grade 1 diastolic dysfunction. EF 60-65%. Mild MR  . Breast cancer ([place]) 12-31-13   Right breast, 12:00, 1.5 cm, T1c,N0 invasive mammary carcinoma, triple negative. --> Rx with Chemo  . Cervical stenosis of spine   . Herpes zoster   . IBS (irritable bowel syndrome)   . Lymphedema    has legs wrapped at [facility]  . Multiple sclerosis ([place]) 2001   Walks from room to room @ home; but Wheelchair when going out.  Marland Kitchen Neuromuscular disorder ([place])    MS  . PONV (postoperative nausea and vomiting)    Related to Fentanyl  . Seizures ([place])    Takes Keppra  . Syncope and collapse    Past Surgical History:  Procedure Laterality Date  . ANKLE SURGERY     Left  . ANKLE SURGERY    . ANTERIOR CERVICAL DECOMP/DISCECTOMY FUSION  11/17/2011   Procedure: ANTERIOR CERVICAL DECOMPRESSION/DISCECTOMY FUSION 2 LEVELS;  Surgeon: Erline Levine, MD;  Location: [facility];  Service: Neurosurgery;  Laterality: N/A;  Cervical Five-Six Six-Seven Anterior cervical decompression/diskectomy/fusion  . BREAST BIOPSY Right 12-31-13   invasive mammary  . BREAST SURGERY Right 02/03/2014   Right simple mastectomy with sentinel node biopsy.  . CHOLECYSTECTOMY    . COLONOSCOPY  2014  . HYSTEROSCOPY WITH D & C N/A 11/27/2018   Procedure: DILATATION AND CURETTAGE /HYSTEROSCOPY;  Surgeon: Gae Dry, MD;  Location: [facility];  Service: Gynecology;  Laterality: N/A;  . Lower extremity venous Dopplers  Feb 27, 2013   No LE DVT  . MASTECTOMY Right 2015  . Port a cath insertion Right 01/19/2010  . PORT-A-CATH REMOVAL     right  .  PORT-A-CATH REMOVAL Right 09/03/2013   Procedure: REMOVAL PORT-A-CATH;  Surgeon: Conrad Laketown, MD;  Location: [facility];  Service: Vascular;  Laterality: Right;  . SPINE SURGERY  Cervical steinois Dr. Vertell Limber 2014 I think?  . TONSILLECTOMY    . TRANSTHORACIC ECHOCARDIOGRAM  03/2013; 02/2014   a) Normal LV size and function with EF 60-65%.; Cannot exclude bicuspid aortic valve with mild AS and mild AI.; b) Normal EF with normal wall motion and valve function x Mild MR. G2 DD. EF 60-65%. Tricuspid AoV  . UPPER GI ENDOSCOPY  2014   Family History  Problem Relation Age of Onset  . Cancer Father        skin  . Heart disease Father   . Heart attack Father        heart  attack in his 52's  . Thyroid disease Sister   . Ovarian cancer Cousin   . Breast cancer Maternal Aunt 60  . Breast cancer Maternal Grandmother 32  . Bladder Cancer Neg Hx   . Kidney cancer Neg Hx    Social History   Socioeconomic History  . Marital status: Married    Spouse name: don  . Number of children: 0  . Years of education: 27  . Highest education level: Not on file  Occupational History  . Occupation: disability  Tobacco Use  . Smoking status: Never Smoker  . Smokeless tobacco: Never Used  Vaping Use  . Vaping Use: Never used  Substance and Sexual Activity  . Alcohol use: No  . Drug use: No  . Sexual activity: Not Currently    Partners: Male    Birth control/protection: None  Other Topics Concern  . Not on file  Social History Narrative   She is married. Recently moved back to [place] after being in [place] for some time. She is accompanied by her husband and aunt.   Never smoked. Never used alcohol.   Social Determinants of Health   Financial Resource Strain: Low Risk   . Difficulty of Paying Living Expenses: Not hard at all  Food Insecurity: No Food Insecurity  . Worried About Charity fundraiser in the Last Year: Never true  . Ran Out of Food in the Last Year: Never true  Transportation  Needs: No Transportation Needs  . Lack of Transportation (Medical): No  . Lack of Transportation (Non-Medical): No  Physical Activity: Inactive  . Days of Exercise per Week: 0 days  . Minutes of Exercise per Session: 0 min  Stress: No Stress Concern Present  . Feeling of Stress : Not at all  Social Connections: Moderately Isolated  . Frequency of Communication with Friends and Family: More than three times a week  . Frequency of Social Gatherings with Friends and Family: Not on file  . Attends Religious Services: Never  . Active Member of Clubs or Organizations: No  . Attends Archivist Meetings: Never  . Marital Status: Married    Tobacco Counseling Counseling given: Not Answered   Clinical Intake:  Pre-visit preparation completed: Yes  Pain : No/denies pain     BMI - recorded: 31.6 Nutritional Status: BMI > 30  Obese Nutritional Risks: Nausea/ vomitting/ diarrhea (diarrhea) Diabetes: No  How often do you need to have someone help you when you read instructions, pamphlets, or other written materials from your doctor or pharmacy?: 1 - Never    Interpreter Needed?: No  Information entered by :: Clemetine Marker LPN   Activities of Daily Living In your present state of health, do you have any difficulty performing the following activities: 08/13/2019 07/31/2019  Hearing? N Y  Vision? N N  Difficulty concentrating or making decisions? N N  Walking or climbing stairs? Y Y  Dressing or bathing? Y Y  Doing errands, shopping? Tempie Donning  Preparing Food and eating ? Y -  Comment preparing food due to mobility -  Using the Toilet? N -  In the past six months, have you accidently leaked urine? Y -  Do you have problems with loss of bowel control? N -  Managing your Medications? N -  Managing your Finances? N -  Housekeeping or managing your Housekeeping? Y -  Some recent data might be hidden    Patient Care Team: Laurell Roof as PCP -  General (Family  Medicine) Bobetta Lime, MD as Referring Physician Byrnett, Forest Gleason, MD (General Surgery) Penni Bombard, MD as Consulting Physician (Neurology) Lloyd Huger, MD as Consulting Physician (Oncology) Leonie Man, MD as Consulting Physician (Cardiology) Minna Merritts, MD as Consulting Physician (Cardiology) Laneta Simmers as Physician Assistant (Urology) Cathi Roan, [facility] (Pharmacist)  Indicate any recent Medical Services you may have received from other than [facility] providers in the past year (date may be approximate).     Assessment:   This is a routine wellness examination for Mikaiya.  Hearing/Vision screen  Hearing Screening   125Hz  250Hz  500Hz  1000Hz  2000Hz  3000Hz  4000Hz  6000Hz  8000Hz   Right ear:           Left ear:           Comments: Pt denies hearing difficulty  Vision Screening Comments: Previously seen by Dr. Katy Fitch, wants to establish care locally. Referral sent to [facility] today   Dietary issues and exercise activities discussed: Current Exercise Habits: The patient does not participate in regular exercise at present, Exercise limited by: neurologic condition(s);orthopedic condition(s)  Goals    . Patient Stated     Patient states she would like to improve gait and balance with strength exercises, physical therapy and use of braces.        Depression Screen PHQ 2/9 Scores 08/13/2019 07/31/2019 01/18/2019 10/22/2018 07/16/2018 05/23/2018 04/18/2018  PHQ - 2 Score 0 0 0 0 0 0 0  PHQ- 9 Score - 0 0 0 0 - 0    Fall Risk Fall Risk  08/13/2019 07/31/2019 02/28/2019 01/18/2019 01/18/2019  Falls in the past year? 1 1 0 1 0  Number falls in past yr: 0 0 - 1 0  Injury with Fall? 0 0 - 1 0  Risk Factor Category  - - - - -  Risk for fall due to : Impaired balance/gait;Impaired mobility;History of fall(s) - - Impaired balance/gait;Impaired mobility;Medication side effect;Other (Comment) -  Risk for fall due to: Comment - - - MS -  Follow  up Falls prevention discussed - - (No Data) -  Comment - - - working with PT/OT -    Any stairs in or around the home? No  - handicap ramp outside If so, are there any without handrails? No  Home free of loose throw rugs in walkways, pet beds, electrical cords, etc? Yes  Adequate lighting in your home to reduce risk of falls? Yes   ASSISTIVE DEVICES UTILIZED TO PREVENT FALLS:  Life alert? Yes  Use of a cane, walker or w/c? Yes  Grab bars in the bathroom? No  Shower chair or bench in shower? Yes  Elevated toilet seat or a handicapped toilet? Yes   TIMED UP AND GO:  Was the test performed? No . Telephonic visit.   Cognitive Function:        Immunizations Immunization History  Administered Date(s) Administered  . Influenza,inj,Quad PF,6+ Mos 10/24/2014, 11/25/2015, 10/28/2016, 12/26/2017, 11/20/2018  . Moderna SARS-COVID-2 Vaccination 07/02/2019, 07/31/2019  . Pneumococcal Polysaccharide-23 02/03/2018  . Tdap 04/09/2012    TDAP status: Up to date   Flu Vaccine status: Up to date   Pneumococcal vaccine status: Up to date   Covid-19 vaccine status: Completed vaccines  Qualifies for Shingles Vaccine? Patient to discuss with PCP for qualifications due to [place] meds.   Screening Tests Health Maintenance  Topic Date Due  . MAMMOGRAM  09/06/2019  . INFLUENZA VACCINE  09/08/2019  .  PAP SMEAR-Modifier  09/25/2021  . COLONOSCOPY  02/07/2022  . TETANUS/TDAP  04/10/2022  . COVID-19 Vaccine  Completed  . Hepatitis C Screening  Completed  . HIV Screening  Completed    Health Maintenance  There are no preventive care reminders to display for this patient.  Colorectal cancer screening: Completed 09/30/12. Repeat every 10 years   Mammogram status: Completed 09/06/18. Repeat every year. Scheduled for 09/09/19.   Lung Cancer Screening: (Low Dose CT Chest recommended if Age 54-80 years, 30 pack-year currently smoking OR have quit w/in 15years.) does not qualify.   Additional  Screening:  Hepatitis C Screening: does qualify; Completed 06/27/16  Vision Screening: Recommended annual ophthalmology exams for early detection of glaucoma and other disorders of the eye. Is the patient up to date with their annual eye exam?  No  Who is the provider or what is the name of the office in which the patient attends annual eye exams? Not established If pt is not established with a provider, would they like to be referred to a provider to establish care? Yes .   Dental Screening: Recommended annual dental exams for proper oral hygiene  Community Resource Referral / Chronic Care Management: CRR required this visit?  No   CCM required this visit?  No      Plan:     I have personally reviewed and noted the following in the patient's chart:   . Medical and social history . Use of alcohol, tobacco or illicit drugs  . Current medications and supplements . Functional ability and status . Nutritional status . Physical activity . Advanced directives . List of other physicians . Hospitalizations, surgeries, and ER visits in previous 12 months . Vitals . Screenings to include cognitive, depression, and falls . Referrals and appointments  In addition, I have reviewed and discussed with patient certain preventive protocols, quality metrics, and best practice recommendations. A written personalized care plan for preventive services as well as general preventive health recommendations were provided to patient.     Clemetine Marker, LPN   08/13/5463   Nurse Notes: pt c/o diarrhea last week with dark, loose stool that changed to clear with a white discharge from her rectum that was also seen by home health aid and RN. Pt did have a regular BM last night and starting to feel better but states urine has foul odor and [facility] home health changed catheter on 08/12/19 but did not have an order for urine culture and plans to request order for next catheter change or earlier. Pt inquired about  daily med to prevent UTI's; advised to discuss with [facility] or urology.

## 2019-08-14 ENCOUNTER — Telehealth: Payer: Self-pay | Admitting: Family Medicine

## 2019-08-14 NOTE — Telephone Encounter (Signed)
Patient is requesting new Rx for supplies.

## 2019-08-14 NOTE — Telephone Encounter (Signed)
Is this ok?

## 2019-08-14 NOTE — Telephone Encounter (Signed)
Patient would like to order pull-ups,pads,and gloves from [employer] bc the company name has changed they need a new prescription refill request, patient did mention she is very low on supplies.please advise  [employer] contact # [phone]

## 2019-08-19 ENCOUNTER — Telehealth: Payer: Self-pay | Admitting: Family Medicine

## 2019-08-19 NOTE — Telephone Encounter (Signed)
[facility] called regarding medical necessity form that was faxed for incontence supplies, gloves, underwear and underpants. Please advise CB- [phone] Option 2

## 2019-08-19 NOTE — Telephone Encounter (Signed)
Request information for supply Rx- forwarded to office

## 2019-08-19 NOTE — Telephone Encounter (Signed)
Order form sent on friday

## 2019-08-19 NOTE — Telephone Encounter (Signed)
Sizes for supplies include... Gloves- 2 boxes of Large and 2 boxes of small  Pull Ups- 2 boxes of 2x  Pads- 2 boxes of Large (30x30)  Patient did mention shes down to her last bag of pul-ups

## 2019-08-22 ENCOUNTER — Telehealth: Payer: Self-pay | Admitting: Family Medicine

## 2019-08-22 NOTE — Telephone Encounter (Signed)
FYI:  I called and left message for Kathlee Nations to get more details.

## 2019-08-22 NOTE — Telephone Encounter (Signed)
Kathlee Nations from [facility] called and stated Pt cancelled therapy for today due to not feeling well and diahrrea / Pt also has strong smelling urine/ Kathlee Nations stated she wants to make sure Pt isnt going septic / Kathlee Nations says the PTA also called Kathlee Nations with concerns about the Pt / please call Kathlee Nations

## 2019-08-23 NOTE — Telephone Encounter (Signed)
Spoke to Rebecca [name] and she stated she followed up with [name] today and she stated she was feeling better.

## 2019-08-23 NOTE — Telephone Encounter (Signed)
Called and spoke to patient and she stated she was feeling better and I notified if she continue to have symptoms go to ER or UC. Patient verbalized understanding

## 2019-09-02 ENCOUNTER — Telehealth: Payer: Self-pay | Admitting: Family Medicine

## 2019-09-02 NOTE — Telephone Encounter (Signed)
Patient's at home care giver called and states she is having bladder spasms and is leaking around the catheter. She states she has not been doing the Renacidin. I informed her to start the irrigation again. Care giver voiced understanding. She will call us again if this does not help.

## 2019-09-04 ENCOUNTER — Other Ambulatory Visit: Payer: Self-pay | Admitting: Physical Medicine & Rehabilitation

## 2019-09-04 ENCOUNTER — Telehealth: Payer: Self-pay | Admitting: Diagnostic Neuroimaging

## 2019-09-04 NOTE — Telephone Encounter (Signed)
..   Pt understands that although there may be some limitations with this type of visit, we will take all precautions to reduce any security or privacy concerns.  Pt understands that this will be treated like an in office visit and we will file with pt's insurance, and there may be a patient responsible charge related to this service. ? ?

## 2019-09-09 ENCOUNTER — Inpatient Hospital Stay: Payer: Medicare Other

## 2019-09-11 ENCOUNTER — Telehealth (INDEPENDENT_AMBULATORY_CARE_PROVIDER_SITE_OTHER): Payer: Medicare Other | Admitting: Diagnostic Neuroimaging

## 2019-09-11 ENCOUNTER — Encounter: Payer: Self-pay | Admitting: Diagnostic Neuroimaging

## 2019-09-11 DIAGNOSIS — G40909 Epilepsy, unspecified, not intractable, without status epilepticus: Secondary | ICD-10-CM

## 2019-09-11 DIAGNOSIS — M62838 Other muscle spasm: Secondary | ICD-10-CM

## 2019-09-11 DIAGNOSIS — G35 Multiple sclerosis: Secondary | ICD-10-CM | POA: Diagnosis not present

## 2019-09-11 DIAGNOSIS — N39498 Other specified urinary incontinence: Secondary | ICD-10-CM | POA: Diagnosis not present

## 2019-09-11 MED ORDER — LEVETIRACETAM 500 MG PO TABS: 500.0000 mg | ORAL_TABLET | Freq: Two times a day (BID) | ORAL | 4 refills | Status: DC

## 2019-09-11 MED ORDER — AMANTADINE HCL 100 MG PO CAPS: 100.0000 mg | ORAL_CAPSULE | Freq: Three times a day (TID) | ORAL | 4 refills | Status: DC

## 2019-09-11 MED ORDER — OXYBUTYNIN CHLORIDE ER 10 MG PO TB24: 10.0000 mg | ORAL_TABLET | Freq: Two times a day (BID) | ORAL | 4 refills | Status: DC

## 2019-09-11 NOTE — Progress Notes (Signed)
    Virtual Visit via VIDEO VISIT  I connected with Rebecca [name] on 09/11/19 at  1:30 PM EDT by video call and verified that I am speaking with the correct person using two identifiers.   I discussed the limitations, risks, security and privacy concerns of performing an evaluation and management service by telephone and the availability of in person appointments. I also discussed with the patient that there may be a patient responsible charge related to this service. The patient expressed understanding and agreed to proceed.   History of Present Illness:  - tolerating rebif; had Moderna covid vaccine (May and June 2021); overall doing well at home. - continuing with gait issues - tolerating muscle relaxers and amantadine    Observations/Objective:  VIDEO EVALUATION - awake and alert; mild dysarthria   Assessment and Plan:   59 y.o. 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 Last dose tysabri 10/29/10. Switched to [place] on 04/25/11.   Had brief seizure before starting gilenya, now on levetiracetam.   Then with right V2, V3 herpes zoster on 05/27/11. Gilenya stopped. Shingles resolved.  Then on rebif. Also s/p ACDF C5-6 (Oct 2013) for spinal stenosis.   Then with breast CA diagnosis, s/p right mastectomy, s/p adriamycin, cytoxan, taxol. Now treatments have been completed.   Now back on rebif (since Jan 2017).  Now with new onset headaches (June 2017). MRI brain unremarkable. HA now improving.   Dx:  1. Multiple sclerosis ([place])   2. Seizure disorder ([place])   3. Muscle spasm   4. Other urinary incontinence      MULTIPLE SCLEROSIS (established problem, stable) - continue rebif - continue home PT  URINARY INCONTINENCE / RECURRENT UTI - continue oxybutynin - follow up with urology  MUSCLE SPASMS - continue baclofen and tizanidine for muscle spasms (per Dr. Posey Pronto; stable)  MS  fatigue - continue amantadine for fatigue (stable)  SEIZURE DISORDER - continue levetiracetam for seizure d/o (stable)  Meds ordered this encounter  Medications  . amantadine (SYMMETREL) 100 MG capsule    Sig: Take 1 capsule (100 mg total) by mouth 3 (three) times daily.    Dispense:  270 capsule    Refill:  4  . levETIRAcetam (KEPPRA) 500 MG tablet    Sig: Take 1 tablet (500 mg total) by mouth 2 (two) times daily.    Dispense:  180 tablet    Refill:  4  . oxybutynin (DITROPAN-XL) 10 MG 24 hr tablet    Sig: Take 1 tablet (10 mg total) by mouth 2 (two) times daily.    Dispense:  180 tablet    Refill:  4     Follow Up Instructions:  - Return in about 9 months (around 06/10/2020). video visit    I discussed the assessment and treatment plan with the patient. The patient was provided an opportunity to ask questions and all were answered. The patient agreed with the plan and demonstrated an understanding of the instructions.   The patient was advised to call back or seek an in-person evaluation if the symptoms worsen or if the condition fails to improve as anticipated.  I provided 25 minutes of non-face-to-face time during this encounter.   Penni Bombard, MD 04/09/6710, 4:58 PM Certified in Neurology, Neurophysiology and Neuroimaging  [facility] [address] [place], [place] [address] [phone]

## 2019-09-13 ENCOUNTER — Telehealth: Payer: Self-pay | Admitting: Family Medicine

## 2019-09-13 NOTE — Telephone Encounter (Signed)
Verbal given except for urine.  Pts complaint is urine smell.  Told nurse she needs to f/u with urology

## 2019-09-13 NOTE — Telephone Encounter (Signed)
[name] is call back regarding verbal orders for patient.  Please return the call at [phone]

## 2019-09-13 NOTE — Telephone Encounter (Signed)
Home Health Verbal Orders - Caller/Agency: Mikeal Hawthorne, Nurse with [facility] Number: [phone] Requesting OT/PT/Skilled Nursing/Social Work/Speech Therapy: Urinalysis with Culture and sensitivity  Frequency:

## 2019-09-16 ENCOUNTER — Ambulatory Visit: Payer: Self-pay | Admitting: Diagnostic Neuroimaging

## 2019-09-16 ENCOUNTER — Inpatient Hospital Stay: Payer: Medicare Other

## 2019-09-19 ENCOUNTER — Other Ambulatory Visit: Payer: Self-pay

## 2019-09-19 ENCOUNTER — Inpatient Hospital Stay: Payer: Medicare Other | Attending: Oncology

## 2019-09-19 DIAGNOSIS — Z8041 Family history of malignant neoplasm of ovary: Secondary | ICD-10-CM | POA: Insufficient documentation

## 2019-09-19 DIAGNOSIS — Z9011 Acquired absence of right breast and nipple: Secondary | ICD-10-CM | POA: Diagnosis not present

## 2019-09-19 DIAGNOSIS — Z9221 Personal history of antineoplastic chemotherapy: Secondary | ICD-10-CM | POA: Diagnosis not present

## 2019-09-19 DIAGNOSIS — Z803 Family history of malignant neoplasm of breast: Secondary | ICD-10-CM | POA: Insufficient documentation

## 2019-09-19 DIAGNOSIS — G35 Multiple sclerosis: Secondary | ICD-10-CM | POA: Insufficient documentation

## 2019-09-19 DIAGNOSIS — Z853 Personal history of malignant neoplasm of breast: Secondary | ICD-10-CM | POA: Diagnosis present

## 2019-09-19 DIAGNOSIS — G629 Polyneuropathy, unspecified: Secondary | ICD-10-CM | POA: Diagnosis not present

## 2019-09-19 DIAGNOSIS — I890 Lymphedema, not elsewhere classified: Secondary | ICD-10-CM | POA: Insufficient documentation

## 2019-09-19 DIAGNOSIS — Z95828 Presence of other vascular implants and grafts: Secondary | ICD-10-CM

## 2019-09-19 MED ORDER — HEPARIN SOD (PORK) LOCK FLUSH 100 UNIT/ML IV SOLN
INTRAVENOUS | Status: AC
  Filled 2019-09-19: qty 5

## 2019-09-19 MED ORDER — SODIUM CHLORIDE 0.9% FLUSH
10.0000 mL | Freq: Once | INTRAVENOUS | Status: AC
  Administered 2019-09-19: 10 mL via INTRAVENOUS
  Filled 2019-09-19: qty 10

## 2019-09-19 MED ORDER — HEPARIN SOD (PORK) LOCK FLUSH 100 UNIT/ML IV SOLN
500.0000 [IU] | Freq: Once | INTRAVENOUS | Status: AC
  Administered 2019-09-19: 500 [IU] via INTRAVENOUS
  Filled 2019-09-19: qty 5

## 2019-09-20 ENCOUNTER — Other Ambulatory Visit: Payer: Self-pay

## 2019-09-20 MED ORDER — LIDOCAINE-PRILOCAINE 2.5-2.5 % EX CREA: 1.0000 "application " | TOPICAL_CREAM | CUTANEOUS | 0 refills | Status: DC | PRN

## 2019-09-27 LAB — HEMOGLOBIN A1C: Hemoglobin A1C: 5.1

## 2019-10-23 ENCOUNTER — Telehealth: Payer: Self-pay | Admitting: Diagnostic Neuroimaging

## 2019-10-23 NOTE — Telephone Encounter (Signed)
Called patient, no answer, phone continuously rang.

## 2019-10-23 NOTE — Telephone Encounter (Addendum)
Pt called wanting to discuss with RN the pain that she is having lately in her ankles and feet. And also the difficulty she is having walking in Physical Therapy. Please advise.

## 2019-10-24 NOTE — Telephone Encounter (Signed)
Pt has returned the call to [facility], please call back.

## 2019-10-24 NOTE — Telephone Encounter (Signed)
Discussed call with MD, Called patient and advised her per Dr Leta Baptist she should contact Dr Posey Pronto to discuss her pain. He does not feel it is a MS flare, advised Dr Posey Pronto can weigh in on recommendations for her pain. Patient verbalized understanding, appreciation.

## 2019-10-24 NOTE — Telephone Encounter (Addendum)
Called patient who reports pain in shin, ankles, on bottom of feet. Biofreeze calms it a little bit, but it's hard to walk. She's taking tizanidine 2-3 x daily to help. She stated she had only been taking it as needed, not regularly. PT told her today she's not doing as well as she was a week ago. She's taking baclofen 2 in morning, 1 at noon, 2 at night. She stated the pain can be excruciating at times, especially in her feet. I advised will discuss with Dr Leta Baptist and call her back Patient verbalized understanding, appreciation.

## 2019-10-30 ENCOUNTER — Telehealth: Payer: Self-pay | Admitting: Diagnostic Neuroimaging

## 2019-10-30 ENCOUNTER — Other Ambulatory Visit: Payer: Self-pay | Admitting: Diagnostic Neuroimaging

## 2019-10-30 NOTE — Telephone Encounter (Signed)
Pt states that the prescription for her baclofen (LIORESAL) 10 MG tablet was written wrong and her pharmacy will not fill it. Please call pt to discuss.

## 2019-10-30 NOTE — Telephone Encounter (Signed)
Called [facility], spoke with Thurmond Butts who stated patient picked up Baclofen refill 09/16/19 #540 tabs, 3 month supply. She should not need refill until 12/16/19.  Called patient and advised her of Ryan's message. She stated she takes 5 tabs total daily, has done that for a long time, but she is almost out of it. Her current bottle is dated filled on 08/03/19. She will look and see if she has another bottle somewhere. Patient verbalized understanding, appreciation.

## 2019-11-04 ENCOUNTER — Other Ambulatory Visit: Payer: Self-pay

## 2019-11-04 ENCOUNTER — Telehealth: Payer: Self-pay | Admitting: *Deleted

## 2019-11-04 ENCOUNTER — Inpatient Hospital Stay: Payer: Medicare Other | Attending: Oncology

## 2019-11-04 DIAGNOSIS — C50919 Malignant neoplasm of unspecified site of unspecified female breast: Secondary | ICD-10-CM | POA: Insufficient documentation

## 2019-11-04 DIAGNOSIS — Z452 Encounter for adjustment and management of vascular access device: Secondary | ICD-10-CM | POA: Diagnosis present

## 2019-11-04 DIAGNOSIS — Z95828 Presence of other vascular implants and grafts: Secondary | ICD-10-CM

## 2019-11-04 MED ORDER — HEPARIN SOD (PORK) LOCK FLUSH 100 UNIT/ML IV SOLN
500.0000 [IU] | Freq: Once | INTRAVENOUS | Status: AC
  Administered 2019-11-04: 500 [IU] via INTRAVENOUS
  Filled 2019-11-04: qty 5

## 2019-11-04 MED ORDER — HEPARIN SOD (PORK) LOCK FLUSH 100 UNIT/ML IV SOLN
INTRAVENOUS | Status: AC
  Filled 2019-11-04: qty 5

## 2019-11-04 MED ORDER — SODIUM CHLORIDE 0.9% FLUSH
10.0000 mL | Freq: Once | INTRAVENOUS | Status: AC
  Administered 2019-11-04: 10 mL via INTRAVENOUS
  Filled 2019-11-04: qty 10

## 2019-11-04 NOTE — Telephone Encounter (Signed)
Ms Iacovelli called and reports that she is having increased pain in feet ankles and tops of legs.  She has spoke with Dr Leta Baptist and he does not feel it is MS related.  HEr appt is not until November and she is asking if she should try to come in to see Dr Posey Pronto earlier.  Please advise.

## 2019-11-04 NOTE — Telephone Encounter (Signed)
We can schedule her for the next available appointment.  Thanks.

## 2019-11-21 ENCOUNTER — Encounter: Payer: Medicare Other | Admitting: Physical Medicine & Rehabilitation

## 2019-11-22 ENCOUNTER — Telehealth: Payer: Self-pay | Admitting: Family Medicine

## 2019-11-22 NOTE — Telephone Encounter (Signed)
Pt had a catheter in and they noticed yeast/ Angie from [facility] is asking for an Rx for yeast infection to be sent to    [id number] [place] [place] - [address] Phone:  [phone]  Fax:  [phone]     Please advise

## 2019-11-22 NOTE — Telephone Encounter (Signed)
Form faxed again.

## 2019-11-22 NOTE — Telephone Encounter (Signed)
Called to ask if the Physician's order form could be refaxed because they cannot read some parts of the form.  CB# [phone] / Fax# [phone] and Order # [id number]

## 2019-11-22 NOTE — Telephone Encounter (Signed)
Please advise 

## 2019-11-25 NOTE — Telephone Encounter (Signed)
Left message for Alanda Slim at [facility] to contact Urology

## 2019-11-28 NOTE — Telephone Encounter (Signed)
Crystal with [facility] stated that the date is not legible on the form faxed over and she needs it corrected and sent back over today. Time sensitive document. Please advise

## 2019-11-29 ENCOUNTER — Telehealth: Payer: Self-pay | Admitting: Diagnostic Neuroimaging

## 2019-11-29 ENCOUNTER — Telehealth: Payer: Self-pay | Admitting: Physician Assistant

## 2019-11-29 DIAGNOSIS — L292 Pruritus vulvae: Secondary | ICD-10-CM

## 2019-11-29 MED ORDER — FLUCONAZOLE 150 MG PO TABS: 150.0000 mg | ORAL_TABLET | Freq: Once | ORAL | 0 refills | Status: AC

## 2019-11-29 NOTE — Telephone Encounter (Signed)
Will fax again

## 2019-11-29 NOTE — Telephone Encounter (Signed)
Angie called back and stated that with even EMS taking her to the ER pt will have a 17hr wait. Pt decided to stay home and Angie is rubbing some cream on her legs. Angie and pt would like to know if the pt can be seen sooner that the available appt in Dec 1st. Please advise.

## 2019-11-29 NOTE — Telephone Encounter (Signed)
Sent, please notify patient

## 2019-11-29 NOTE — Telephone Encounter (Signed)
Angie from [facility] [phone]).  She is with the patient and feels like she has yeast that needs to be addressed.    She is requesting that we send a prescription [facility] the [facility]lucan pill [facility].   Please advise.

## 2019-11-29 NOTE — Telephone Encounter (Signed)
FYI-Angie from [facility] called wanting to inform the RN and provider that she was having the pt go to the ER due to a 10 out of 10 pain pt was having in her lower bilateral extremities.

## 2019-11-29 NOTE — Telephone Encounter (Signed)
Notified patient as instructed, patient pleased. Discussed follow-up appointments, patient agrees  

## 2019-12-01 IMAGING — MR MR HEAD WO/W CM
15 series · 47 of 48 positions shown · IV contrast (MH)
Comparison: 05/14/2016 MRI of the head.

CLINICAL DATA: 57 y/o F; lower extremity weakness. History of
multiple sclerosis.

EXAM:
MRI HEAD WITHOUT AND WITH CONTRAST
TECHNIQUE: Multiplanar, multiecho pulse sequences of the brain and surrounding
structures were obtained without and with intravenous contrast.
CONTRAST:  10 cc Gadavist.

[Series 5: ax dwi_tracew · axial · 3.0mm · 0.73mm/px · z∈[-38,+122]mm · 3 of 55 slices shown]
[im 1/55]
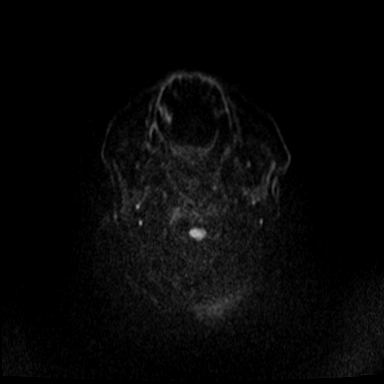
[im 28/55]
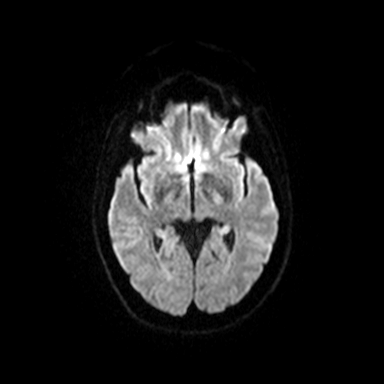
[im 55/55]
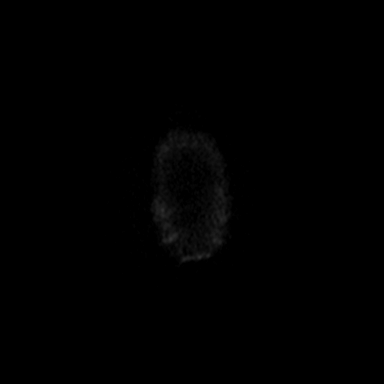

[Series 6: ax dwi_adc · axial · 3.0mm · 0.73mm/px · z∈[-38,+122]mm · 3 of 55 slices shown]
[im 1/55]
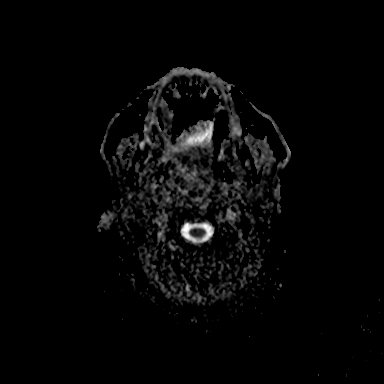
[im 28/55]
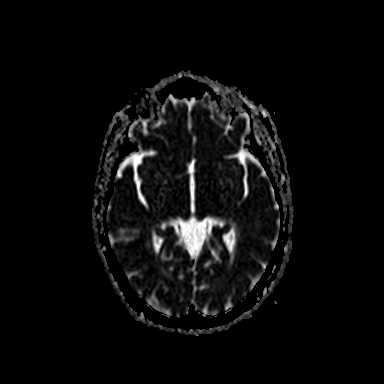
[im 55/55]
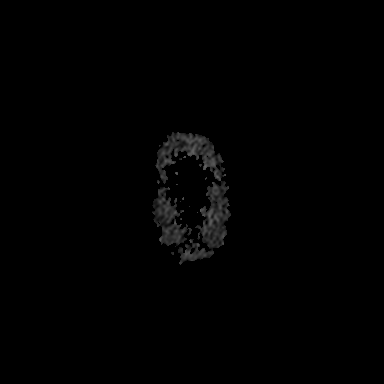

[Series 7: cor dwi_tracew · coronal · 5.0mm · 0.60mm/px · 2 of 36 slices shown]
[im 1/36]
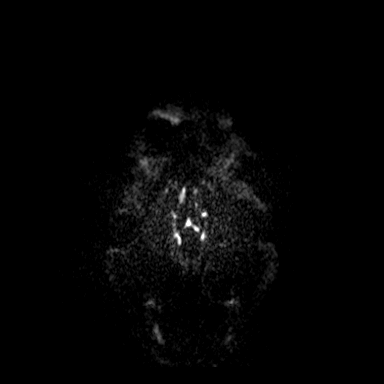
[im 36/36]
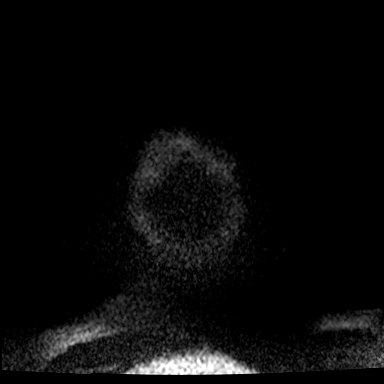

[Series 8: cor dwi_adc · coronal · 5.0mm · 0.60mm/px · 2 of 36 slices shown]
[im 1/36]
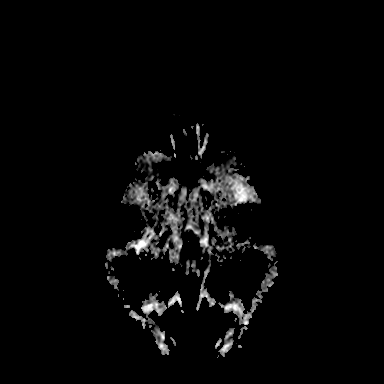
[im 36/36]
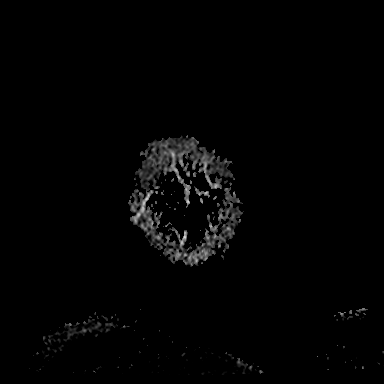

[Series 9: T1 · sagittal · 5.0mm · 0.62mm/px · 1 of 21 slices shown (1 of 2)]
[im 1/21]
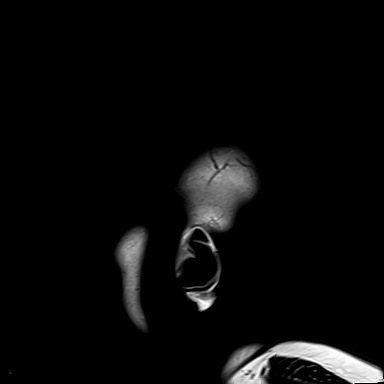

[Series 10: FLAIR · sagittal · 5.0mm · 0.94mm/px · 1 of 21 slices shown (1 of 2)]
[im 1/21]
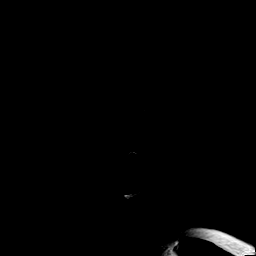

[Series 11: T2 · axial · 5.0mm · 0.53mm/px · z∈[-29,+113]mm · 2 of 25 slices shown]
[im 1/25]
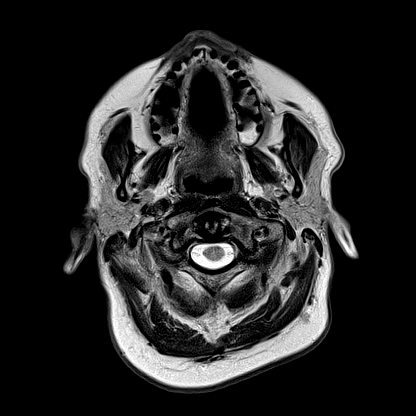
[im 25/25]
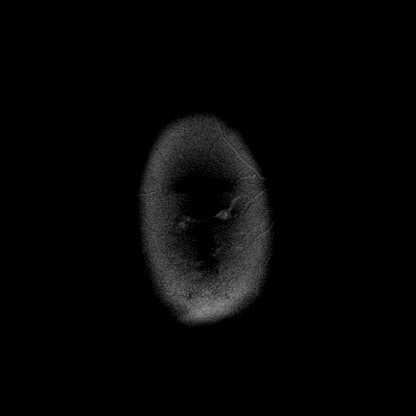

[Series 12: swi_images · axial · 3.0mm · 0.90mm/px · z∈[-33,+118]mm · 3 of 52 slices shown]
[im 1/52]
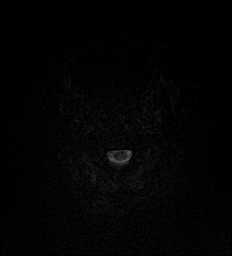
[im 26/52]
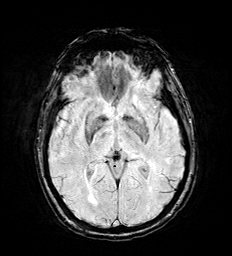
[im 52/52]
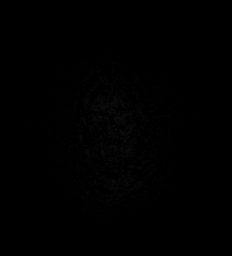

[Series 13: mip_images(sw) · axial · 24.0mm · 0.90mm/px · z∈[-23,+107]mm · 3 of 45 slices shown]
[im 1/45]
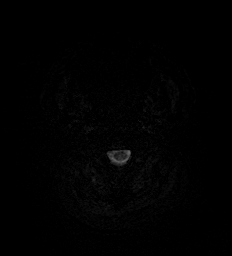
[im 23/45]
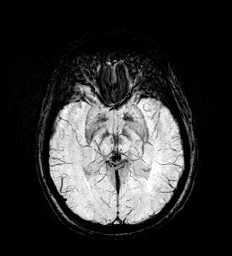
[im 45/45]
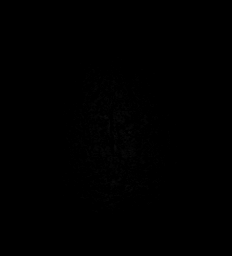

[Series 14: FLAIR · axial · 3.0mm · 0.53mm/px · z∈[-35,+119]mm · 3 of 53 slices shown (2 of 2)]
[im 1/53]
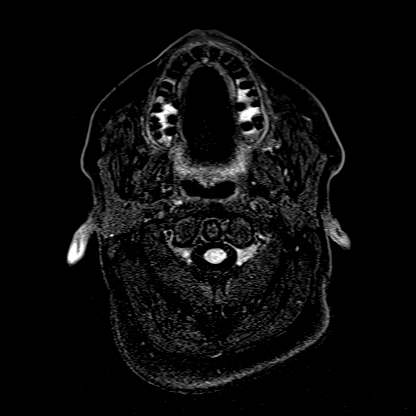
[im 27/53]
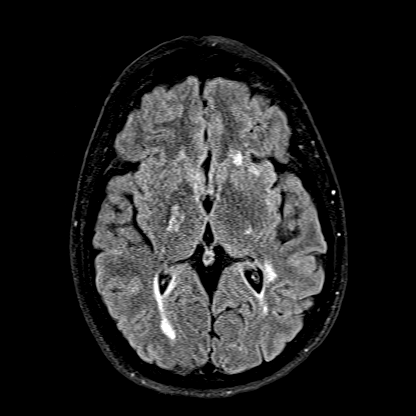
[im 53/53]
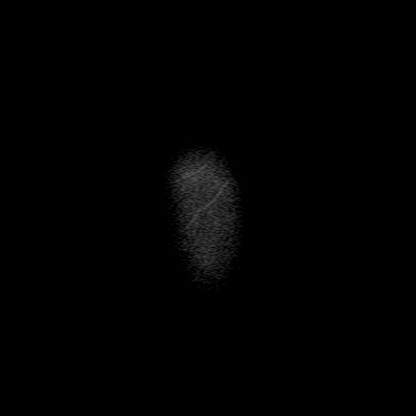

[Series 15: T1 · axial · 1.0mm · 0.98mm/px · z∈[-34,+123]mm · 9 of 160 slices shown (2 of 2)]
[im 1/160]
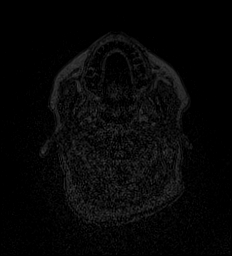
[im 18/160]
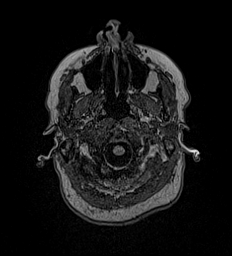
[im 36/160]
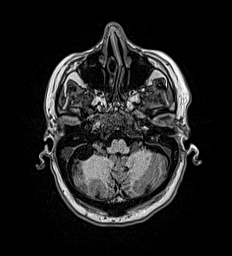
[im 54/160]
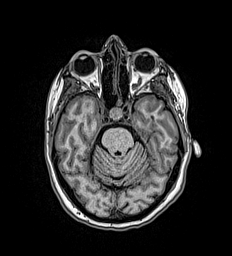
[im 71/160]
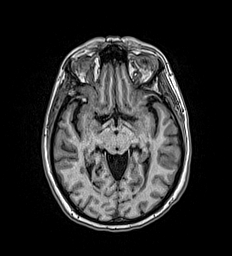
[im 89/160]
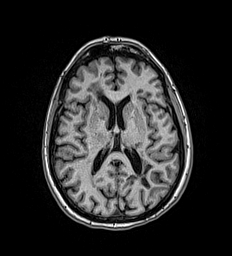
[im 107/160]
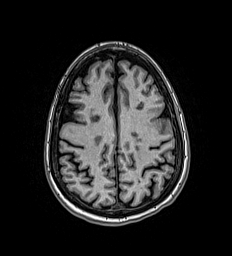
[im 142/160]
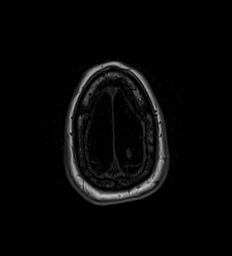
[im 160/160]
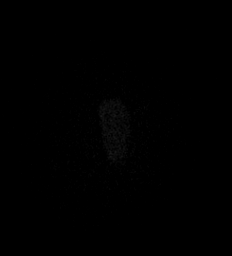

[Series 16: T2 post-contrast · coronal · 5.0mm · 0.57mm/px · 2 of 28 slices shown]
[im 1/28]
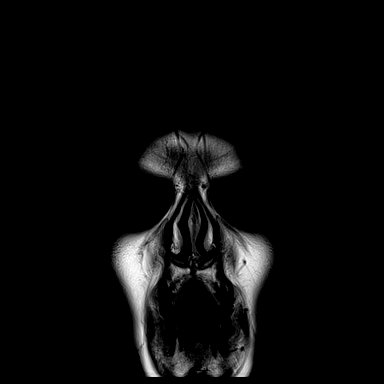
[im 28/28]
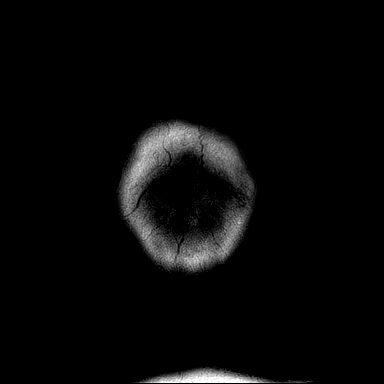

[Series 17: T1 post-contrast · axial · 1.0mm · 0.98mm/px · z∈[-34,+123]mm · 10 of 160 slices shown (1 of 3)]
[im 1/160]
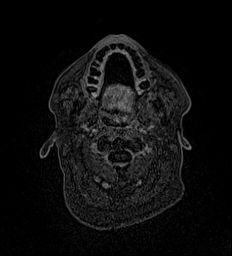
[im 18/160]
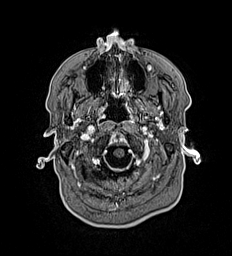
[im 36/160]
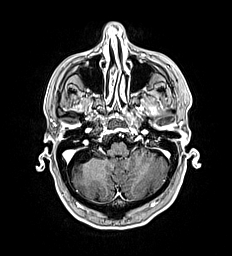
[im 54/160]
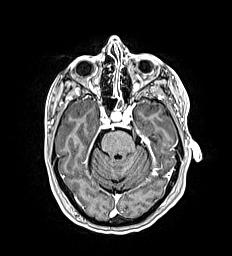
[im 71/160]
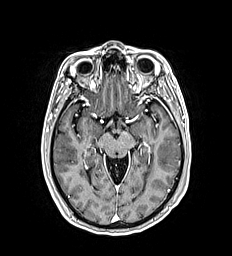
[im 89/160]
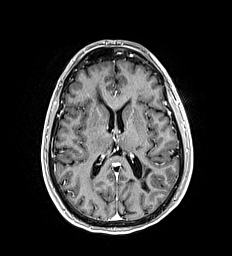
[im 107/160]
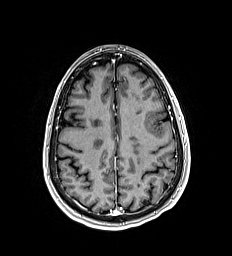
[im 124/160]
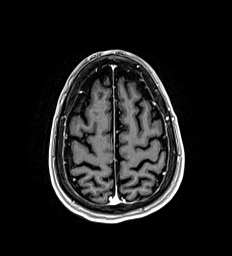
[im 142/160]
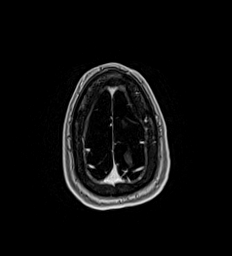
[im 160/160]
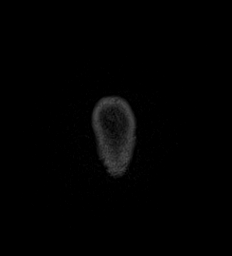

[Series 18: T1 post-contrast · coronal · 5.0mm · 0.57mm/px · 2 of 28 slices shown (2 of 3)]
[im 1/28]
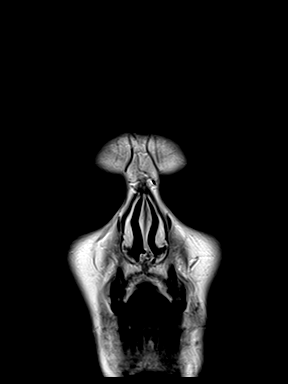
[im 28/28]
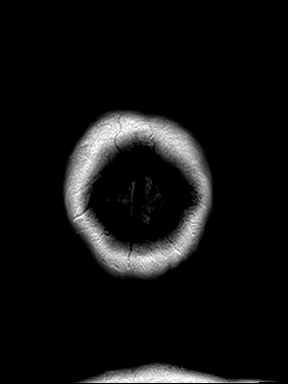

[Series 19: T1 post-contrast · sagittal · 5.0mm · 0.62mm/px · 1 of 21 slices shown (3 of 3)]
[im 1/21]
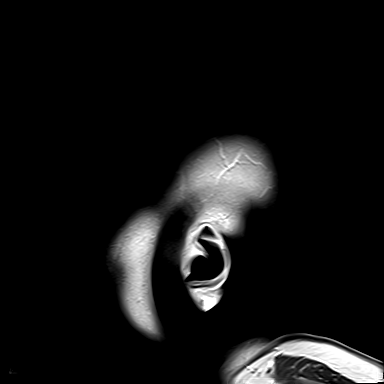

[47 of 48 positions shown; findings below may reference images not displayed]

FINDINGS: Brain: Numerous white matter lesions are present within pericallosal
white matter with a radial configuration relative to the ventricles,
juxta cortical white matter, pons, right cerebral peduncle, left
cerebellum subcortical white matter, posterior limbs of internal
capsules, the right anterior limb of internal capsule, as well as
under belly of corpus callosum. Several lesions demonstrate T1
hypointensity compatible with myelin loss. White matter lesions
appear stable in comparison with the of the head. After
administration of intravenous contrast there is no definite abnormal
enhancement.

No reduced diffusion to suggest acute or early subacute infarction.
No extra-axial collection, hydrocephalus, mass effect, or
herniation.

Vascular: Normal flow voids.

Skull and upper cervical spine: Normal marrow signal.

Sinuses/Orbits: Negative.

Other: None.
IMPRESSION: Stable extensive white matter hyperintensities compatible with
history of multiple sclerosis. No new lesion or enhancement
identified to suggest an active process.

## 2019-12-02 NOTE — Telephone Encounter (Signed)
Can setup video visit with me. Recommend follow up with pain mgmt. -VRP

## 2019-12-02 NOTE — Telephone Encounter (Signed)
error 

## 2019-12-02 NOTE — Telephone Encounter (Signed)
Called patient who stated she's feeling some better today. She has biofreeze and blue emu creams applied by [facility] RN for pain, muscle spasms and cramps in her legs. She's taking ibuprofen 600 mg alternating with tizanidine up to 4 times a day for pain. She continues on baclofen as prescribed. Her feet curl up when the pain comes. She stated the pain is awful. She sees Dr Posey Pronto- Rehab on 12/09/19. I advised will discuss her concerns with Dr Tish Frederickson and call her back, will also schedule FU at that time. Patient verbalized understanding, appreciation.

## 2019-12-03 ENCOUNTER — Other Ambulatory Visit: Payer: Self-pay | Admitting: Family Medicine

## 2019-12-03 NOTE — Telephone Encounter (Signed)
Called patient and advised she FU with pain manage,ent. We schedule a video visit for her. Patient verbalized understanding, appreciation.

## 2019-12-09 ENCOUNTER — Other Ambulatory Visit: Payer: Self-pay

## 2019-12-09 ENCOUNTER — Encounter: Payer: Medicare Other | Attending: Physical Medicine & Rehabilitation | Admitting: Physical Medicine & Rehabilitation

## 2019-12-09 ENCOUNTER — Encounter: Payer: Self-pay | Admitting: Physical Medicine & Rehabilitation

## 2019-12-09 VITALS — BP 158/91 | HR 76 | Temp 98.8°F | Ht 69.0 in | Wt 214.0 lb

## 2019-12-09 DIAGNOSIS — G801 Spastic diplegic cerebral palsy: Secondary | ICD-10-CM

## 2019-12-09 DIAGNOSIS — N319 Neuromuscular dysfunction of bladder, unspecified: Secondary | ICD-10-CM | POA: Diagnosis present

## 2019-12-09 DIAGNOSIS — G35 Multiple sclerosis: Secondary | ICD-10-CM | POA: Diagnosis not present

## 2019-12-09 DIAGNOSIS — G8220 Paraplegia, unspecified: Secondary | ICD-10-CM | POA: Diagnosis present

## 2019-12-09 DIAGNOSIS — R269 Unspecified abnormalities of gait and mobility: Secondary | ICD-10-CM

## 2019-12-09 DIAGNOSIS — G35D Multiple sclerosis, unspecified: Secondary | ICD-10-CM

## 2019-12-09 DIAGNOSIS — I890 Lymphedema, not elsewhere classified: Secondary | ICD-10-CM | POA: Diagnosis not present

## 2019-12-09 DIAGNOSIS — IMO0002 Reserved for concepts with insufficient information to code with codable children: Secondary | ICD-10-CM

## 2019-12-09 NOTE — Progress Notes (Addendum)
Subjective:    Patient ID: Rebecca [name], female    DOB: 02/28/1960, 59 y.o.   MRN: [id number]  HPI  Female, history of chronic pain, lymphedema bilateral lower extremities, right breast cancer, chemotherapy, seizure disorder, multiple sclerosis presents for follow up for MS.  Last clinic visit 06/24/19.  Husband supplements history. Since that time, pt states she is doing HEP.  She states she has not been able to use her KAFO because she was having pain in left and then right ankle. She had a yeast infection and was treated with Diflucan and her symptoms resolved. Denies falls. She still feels she may have a UTI.  She continues to take same dose of Baclofen and Tizanidine. She is using walker more. She continues to have indwelling foley.   Pain Inventory Average Pain 0 Pain Right Now 0 My pain is currently, no pain  In the last 24 hours, has pain interfered with the following? General activity 0 Relation with others 0 Enjoyment of life 0 What TIME of day is your pain at its worst? currently, no pain Sleep (in general) Good  Pain is worse with: currently, no pain Pain improves with: currently, no pain Relief from Meds: currently, no pain  Mobility walk with assistance use a walker how many minutes can you walk? 5-10 ability to climb steps?  no do you drive?  no use a wheelchair needs help with transfers Do you have any goals in this area?  yes  Function not employed: date last employed . disabled: date disabled . I need assistance with the following:  dressing, bathing, meal prep, household duties and shopping Do you have any goals in this area?  yes  Neuro/Psych bladder control problems weakness numbness tingling trouble walking spasms  Prior Studies Any changes since last visit?  no    Physicians involved in your care Any changes since last visit?  no   Family History  Problem Relation Age of Onset  . Cancer Father        skin  . Heart disease Father     . Heart attack Father        heart attack in his 76's  . Thyroid disease Sister   . Ovarian cancer Cousin   . Breast cancer Maternal Aunt 60  . Breast cancer Maternal Grandmother 56  . Bladder Cancer Neg Hx   . Kidney cancer Neg Hx    Social History   Socioeconomic History  . Marital status: Married    Spouse name: don  . Number of children: 0  . Years of education: 76  . Highest education level: Not on file  Occupational History  . Occupation: disability  Tobacco Use  . Smoking status: Never Smoker  . Smokeless tobacco: Never Used  Vaping Use  . Vaping Use: Never used  Substance and Sexual Activity  . Alcohol use: No  . Drug use: No  . Sexual activity: Not Currently    Partners: Male    Birth control/protection: None  Other Topics Concern  . Not on file  Social History Narrative   She is married. Recently moved back to [place] after being in [place] for some time. She is accompanied by her husband and aunt.   Never smoked. Never used alcohol.   Social Determinants of Health   Financial Resource Strain: Low Risk   . Difficulty of Paying Living Expenses: Not hard at all  Food Insecurity: No Food Insecurity  . Worried About Estate manager/land agent  of Food in the Last Year: Never true  . Ran Out of Food in the Last Year: Never true  Transportation Needs: No Transportation Needs  . Lack of Transportation (Medical): No  . Lack of Transportation (Non-Medical): No  Physical Activity: Inactive  . Days of Exercise per Week: 0 days  . Minutes of Exercise per Session: 0 min  Stress: No Stress Concern Present  . Feeling of Stress : Not at all  Social Connections: Moderately Isolated  . Frequency of Communication with Friends and Family: More than three times a week  . Frequency of Social Gatherings with Friends and Family: Not on file  . Attends Religious Services: Never  . Active Member of Clubs or Organizations: No  . Attends Archivist Meetings: Never  .  Marital Status: Married   Past Surgical History:  Procedure Laterality Date  . ANKLE SURGERY     Left  . ANKLE SURGERY    . ANTERIOR CERVICAL DECOMP/DISCECTOMY FUSION  11/17/2011   Procedure: ANTERIOR CERVICAL DECOMPRESSION/DISCECTOMY FUSION 2 LEVELS;  Surgeon: Erline Levine, MD;  Location: [facility];  Service: Neurosurgery;  Laterality: N/A;  Cervical Five-Six Six-Seven Anterior cervical decompression/diskectomy/fusion  . BREAST BIOPSY Right 12-31-13   invasive mammary  . BREAST SURGERY Right 02/03/2014   Right simple mastectomy with sentinel node biopsy.  . CHOLECYSTECTOMY    . COLONOSCOPY  2014  . HYSTEROSCOPY WITH D & C N/A 11/27/2018   Procedure: DILATATION AND CURETTAGE /HYSTEROSCOPY;  Surgeon: Gae Dry, MD;  Location: [facility];  Service: Gynecology;  Laterality: N/A;  . Lower extremity venous Dopplers  Feb 27, 2013   No LE DVT  . MASTECTOMY Right 2015  . Port a cath insertion Right 01/19/2010  . PORT-A-CATH REMOVAL     right  . PORT-A-CATH REMOVAL Right 09/03/2013   Procedure: REMOVAL PORT-A-CATH;  Surgeon: Conrad Defiance, MD;  Location: [facility];  Service: Vascular;  Laterality: Right;  . SPINE SURGERY  Cervical steinois Dr. Vertell Limber 2014 I think?  . TONSILLECTOMY    . TRANSTHORACIC ECHOCARDIOGRAM  03/2013; 02/2014   a) Normal LV size and function with EF 60-65%.; Cannot exclude bicuspid aortic valve with mild AS and mild AI.; b) Normal EF with normal wall motion and valve function x Mild MR. G2 DD. EF 60-65%. Tricuspid AoV  . UPPER GI ENDOSCOPY  2014   Past Medical History:  Diagnosis Date  . Allergy   . Aortic valve disease    Mild AS / AI - most recent Echo demonstrated tricuspid aortic valve.  . Bacterial endocarditis    History of .  Marland Kitchen Bilateral lower extremity edema    Noncardiac.  Chronic. LE Venous dopplers - negative for DVT.; Echocardiogram January 2016: Normal EF with normal wall motion and valve function. Only grade 1 diastolic dysfunction. EF 60-65%. Mild MR   . Breast cancer ([place]) 12-31-13   Right breast, 12:00, 1.5 cm, T1c,N0 invasive mammary carcinoma, triple negative. --> Rx with Chemo  . Cervical stenosis of spine   . Herpes zoster   . IBS (irritable bowel syndrome)   . Lymphedema    [facility] legs [facility] at [facility]  . Multiple sclerosis ([place]) 2001   Walks from room to room @ home; but Wheelchair when going out.  Marland Kitchen Neuromuscular disorder ([place])    MS  . PONV (postoperative nausea and vomiting)    Related to Fentanyl  . Seizures ([place])    Takes Keppra  . Syncope and collapse  BP (!) 158/91   Pulse 76   Temp 98.8 F (37.1 C)   Ht 5\' 9"  (1.753 m)   Wt 214 lb (97.1 kg)   SpO2 98%   BMI 31.60 kg/m   Opioid Risk Score:   Fall Risk Score:  `1  Depression screen PHQ 2/9  Depression screen [facility] 2/9 08/13/2019 07/31/2019 01/18/2019 10/22/2018 07/16/2018 05/23/2018 04/18/2018  Decreased Interest 0 0 0 0 0 0 0  Down, Depressed, Hopeless 0 0 0 0 0 0 0  PHQ - 2 Score 0 0 0 0 0 0 0  Altered sleeping - 0 0 0 0 - 0  Tired, decreased energy - 0 0 0 0 - 0  Change in appetite - 0 0 0 0 - 0  Feeling bad or failure about yourself  - 0 0 0 0 - 0  Trouble concentrating - 0 0 0 0 - 0  Moving slowly or fidgety/restless - 0 0 0 0 - 0  Suicidal thoughts - 0 0 0 0 - 0  PHQ-9 Score - 0 0 0 0 - 0  Difficult doing work/chores - Not difficult at all Not difficult at all Not difficult at all Not difficult at all - Not difficult at all  Some recent data might be hidden    Review of Systems  Constitutional: Negative.   HENT: Negative.   Eyes: Negative.   Respiratory: Negative.   Cardiovascular: Positive for leg swelling.  Gastrointestinal: Negative.   Endocrine: Negative.   Genitourinary: Positive for difficulty urinating.       Has foley currently and has experienced UTIs since last seen  Musculoskeletal: Positive for arthralgias and gait problem.  Skin: Negative.   Allergic/Immunologic: Negative.   Neurological: Positive for weakness and numbness.        Tingling  Hematological: Negative.   Psychiatric/Behavioral: Negative.       Objective:   Physical Exam  Constitutional: No distress . Vital signs reviewed. HENT: Normocephalic.  Atraumatic. Eyes: EOMI. No discharge. Cardiovascular: No JVD.   Respiratory: Normal effort.  No stridor.   GI: Non-distended.   Skin: Warm and dry.  Intact. Psych: Normal mood.  Normal behavior. Musc: No tenderness in extremities. LE edema. Neuro: Alert Motor:  RLE: 2-/5 HF,3-/5 KE, 2-/5 ADF/PF  LLE: 3/5 HF,4-/5 KE, 4-/5 ADF/PF MAS:B/l LE, limited due to positioning, but significant tone in hip flexors and knee flexors R>L    Assessment & Plan:  Right-handed female, history of chronic pain, lymphedema bilateral lower extremities, right breast cancer, chemotherapy, seizure disorder, multiple sclerosis presents for follow up for MS.   1. MS   Continue therapies  Cont meds   Continue follow up with Neurology  Recently fitted for KAFO, will plan to start using once resolution of UTI  2.  Spastic paraparesis:   Continue ROM  Continue Baclofen  Continue tizanidine 2mg  BID PRN, usually taking daily  Good benefit with combination  Patient would like to hold off on Botulinum toxin at this time - encouraged conversation with therapies  Spasticity exacerbated by yeast infection +/- UTI - following up with UA/Ucx later this week  3. Chronic lymphedema  Waxes and wanes  4. Gait abnormality  Continue HEP  Continue walker/wheelchair for safety, overall less reliance on wheelchair   Cont KAFO  5. Neurogenic bladder with spastic bladder  With recurrent UTIs  Cont meds  Continue follow up with Urology - holding off on suprapubic cath, at present  Continue indwelling foley

## 2019-12-16 ENCOUNTER — Telehealth (INDEPENDENT_AMBULATORY_CARE_PROVIDER_SITE_OTHER): Payer: Medicare Other | Admitting: Diagnostic Neuroimaging

## 2019-12-16 ENCOUNTER — Encounter: Payer: Self-pay | Admitting: Diagnostic Neuroimaging

## 2019-12-16 ENCOUNTER — Telehealth: Payer: Self-pay | Admitting: *Deleted

## 2019-12-16 DIAGNOSIS — R5383 Other fatigue: Secondary | ICD-10-CM | POA: Diagnosis not present

## 2019-12-16 DIAGNOSIS — G35 Multiple sclerosis: Secondary | ICD-10-CM

## 2019-12-16 DIAGNOSIS — G40909 Epilepsy, unspecified, not intractable, without status epilepticus: Secondary | ICD-10-CM | POA: Diagnosis not present

## 2019-12-16 MED ORDER — LEVETIRACETAM 500 MG PO TABS
500.0000 mg | ORAL_TABLET | Freq: Two times a day (BID) | ORAL | 4 refills | Status: DC
Start: 2019-12-16 — End: 2022-07-07

## 2019-12-16 MED ORDER — AMANTADINE HCL 100 MG PO CAPS
100.0000 mg | ORAL_CAPSULE | Freq: Three times a day (TID) | ORAL | 4 refills | Status: DC
Start: 2019-12-16 — End: 2021-01-19

## 2019-12-16 NOTE — Progress Notes (Addendum)
Virtual Visit via VIDEO VISIT  I connected with Rebecca [name] on 12/16/19 at  1:00 PM EST by video call and verified that I am speaking with the correct person using two identifiers.   I discussed the limitations, risks, security and privacy concerns of performing an evaluation and management service by telephone and the availability of in person appointments. I also discussed with the patient that there may be a patient responsible charge related to this service. The patient expressed understanding and agreed to proceed.  Patient location: home Physician location: office   History of Present Illness:  UPDATE (12/16/19 , VRP): Since last visit, doing well; now s/p yeast UTI, on diflucan, and pain in feet reduced. Has seen Dr. Posey Pronto. Has home health nurse; urine still has some odor and patient needs follow up with urology. Tolerating rebif.   PRIOR HPI: - tolerating rebif; had Moderna covid vaccine (May and June 2021); overall doing well at home. - continuing with gait issues - tolerating muscle relaxers and amantadine    Observations/Objective:  VIDEO EVALUATION - awake and alert; mild dysarthria   Assessment and Plan:   59 y.o. 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 Last dose tysabri 10/29/10. Switched to [place] on 04/25/11.   Had brief seizure before starting gilenya, now on levetiracetam.   Then with right V2, V3 herpes zoster on 05/27/11. Gilenya stopped. Shingles resolved.  Then on rebif. Also s/p ACDF C5-6 (Oct 2013) for spinal stenosis.   Then with breast CA diagnosis, s/p right mastectomy, s/p adriamycin, cytoxan, taxol. Now treatments have been completed.   Now back on rebif (since Jan 2017).  Now with new onset headaches (June 2017). MRI brain unremarkable. HA now improving.    Dx:  1. Multiple sclerosis ([place])   2. Seizure disorder ([place])   3. Other fatigue       MULTIPLE SCLEROSIS (established problem, stable) - continue rebif - continue home health PT / nursing  URINARY INCONTINENCE / RECURRENT UTI - continue oxybutynin - follow up with urology  MUSCLE SPASMS - continue baclofen and tizanidine for muscle spasms (per Dr. Posey Pronto; stable)  MS fatigue - continue amantadine for fatigue (stable)  SEIZURE DISORDER - continue levetiracetam for seizure d/o (stable)  Meds ordered this encounter  Medications  . amantadine (SYMMETREL) 100 MG capsule    Sig: Take 1 capsule (100 mg total) by mouth 3 (three) times daily.    Dispense:  270 capsule    Refill:  4  . levETIRAcetam (KEPPRA) 500 MG tablet    Sig: Take 1 tablet (500 mg total) by mouth 2 (two) times daily.    Dispense:  180 tablet    Refill:  4    Follow Up Instructions:  - Return in about 1 year (around 12/15/2020) for virtual visit.     I discussed the assessment and treatment plan with the patient. The patient was provided an opportunity to ask questions and all were answered. The patient agreed with the plan and demonstrated an understanding of the instructions.   The patient was advised to call back or seek an in-person evaluation if the symptoms worsen or if the condition fails to improve as anticipated.  I provided 15 minutes of non-face-to-face time during this encounter.   Penni Bombard, MD 16/02/958, 4:54 PM Certified in Neurology, Neurophysiology and Neuroimaging  [facility] [address], [place],  [place] [address] [phone]

## 2019-12-16 NOTE — Telephone Encounter (Signed)
[facility] calling asking for order for a UA, they state that pt has foul smelling urine.  Called back for more information, this has been going on since Friday, pt had catheter changed on Saturday. No fever, no other UTI symptoms.

## 2019-12-17 NOTE — Telephone Encounter (Signed)
Attempted call back contact number several times. Voicemail does not match home health.

## 2019-12-17 NOTE — Telephone Encounter (Signed)
No indication for urine testing in the absence of infective symptoms including lower abdominal pain, low back pain, fever, chills, nausea, or vomiting.  Malodorous urine is a common finding with chronic Foley catheters.  Encourage patient to increase hydration.

## 2019-12-20 NOTE — Telephone Encounter (Signed)
Nurse from [facility] is calling stating that pt's urine is so foul you can smell it at the door. They have increased liquids and she doesn't have any of the UTI symptoms but the smell is not normal per pt and nurse. Pt has MS and will not have the same symptoms of back pain. Please return call to :  [facility] office number [phone] office [phone] fax

## 2019-12-20 NOTE — Telephone Encounter (Signed)
Spoke with [place] at [employer] and explained message below from [place], she will pass the message to [place] and have her call to schedule pt appt.

## 2019-12-20 NOTE — Telephone Encounter (Signed)
Tried to contact [place] at [facility], but her mailbox was full and I could not leave a message. If she should call back concerning Mrs. Vrooman, I would like to know how often they are changing her Foley catheter and when the last time it was changed. If it has been longer than 2 weeks, I suggest they exchange the Foley to see if that will help with the odor.  As discussed before, foul-smelling urine is not an indication of an infection. The urine in the Foley bag becomes colonized and this causes the malodorous urine.  I understand that she has MS and may not have the common symptoms of suprapubic pain or burning, but if she develops gross hematuria, has fevers, elevated blood pressure or mental status changes these would be indications of a UTI.     We have not seen her since December 2020, so I would recommend that she comes in sometime next week for an office visit.

## 2019-12-23 ENCOUNTER — Ambulatory Visit: Payer: Medicare Other | Admitting: Physical Medicine & Rehabilitation

## 2019-12-26 ENCOUNTER — Telehealth: Payer: Self-pay

## 2019-12-26 DIAGNOSIS — Z978 Presence of other specified devices: Secondary | ICD-10-CM

## 2019-12-26 DIAGNOSIS — R8290 Unspecified abnormal findings in urine: Secondary | ICD-10-CM

## 2019-12-26 DIAGNOSIS — N319 Neuromuscular dysfunction of bladder, unspecified: Secondary | ICD-10-CM

## 2019-12-26 NOTE — Telephone Encounter (Signed)
Copied from [place] [id number]. Topic: Referral - Request for Referral >> Dec 26, 2019  3:02 PM Alanda Slim E wrote: Has patient seen PCP for this complaint? No  *If NO, is insurance requiring patient see PCP for this issue before PCP can refer them? Referral for which specialty: [facility]  Preferred provider/office: Dr. Denna Haggard facility  Reason for referral: Pt doesn't want to see a PA and wants to see a MD / Pt was referred to her at one point from the hospital and would like another referral

## 2019-12-26 NOTE — Telephone Encounter (Signed)
Patient would like new referral for Urology

## 2019-12-30 ENCOUNTER — Inpatient Hospital Stay: Payer: Medicare Other

## 2020-01-03 ENCOUNTER — Emergency Department
Admission: EM | Admit: 2020-01-03 | Discharge: 2020-01-04 | Disposition: A | Discharge status: Home / Self Care | Payer: Medicare Other | Attending: Emergency Medicine | Admitting: Emergency Medicine

## 2020-01-03 ENCOUNTER — Telehealth: Payer: Self-pay | Admitting: Family Medicine

## 2020-01-03 ENCOUNTER — Other Ambulatory Visit: Payer: Self-pay

## 2020-01-03 DIAGNOSIS — N390 Urinary tract infection, site not specified: Secondary | ICD-10-CM | POA: Diagnosis not present

## 2020-01-03 DIAGNOSIS — D709 Neutropenia, unspecified: Secondary | ICD-10-CM | POA: Diagnosis not present

## 2020-01-03 DIAGNOSIS — G35 Multiple sclerosis: Secondary | ICD-10-CM | POA: Diagnosis not present

## 2020-01-03 DIAGNOSIS — G20 Parkinson's disease: Secondary | ICD-10-CM | POA: Insufficient documentation

## 2020-01-03 DIAGNOSIS — G709 Myoneural disorder, unspecified: Secondary | ICD-10-CM | POA: Diagnosis not present

## 2020-01-03 DIAGNOSIS — Z853 Personal history of malignant neoplasm of breast: Secondary | ICD-10-CM | POA: Insufficient documentation

## 2020-01-03 DIAGNOSIS — D696 Thrombocytopenia, unspecified: Secondary | ICD-10-CM | POA: Insufficient documentation

## 2020-01-03 DIAGNOSIS — R569 Unspecified convulsions: Secondary | ICD-10-CM | POA: Insufficient documentation

## 2020-01-03 DIAGNOSIS — T428X1A Poisoning by antiparkinsonism drugs and other central muscle-tone depressants, accidental (unintentional), initial encounter: Secondary | ICD-10-CM | POA: Diagnosis not present

## 2020-01-03 DIAGNOSIS — Z96662 Presence of left artificial ankle joint: Secondary | ICD-10-CM | POA: Insufficient documentation

## 2020-01-03 DIAGNOSIS — T83031A Leakage of indwelling urethral catheter, initial encounter: Secondary | ICD-10-CM | POA: Diagnosis present

## 2020-01-03 LAB — COMPREHENSIVE METABOLIC PANEL
ALT: 38 U/L (ref 0–44)
AST: 30 U/L (ref 15–41)
Albumin: 4.3 g/dL (ref 3.5–5.0)
Alkaline Phosphatase: 85 U/L (ref 38–126)
Anion gap: 10 (ref 5–15)
BUN: 14 mg/dL (ref 6–20)
CO2: 28 mmol/L (ref 22–32)
Calcium: 9.2 mg/dL (ref 8.9–10.3)
Chloride: 103 mmol/L (ref 98–111)
Creatinine, Ser: 0.51 mg/dL (ref 0.44–1.00)
GFR, Estimated: >60 mL/min (ref >60)
Glucose, Bld: 115 mg/dL — ABNORMAL HIGH (ref 70–99)
Potassium: 3.3 mmol/L — ABNORMAL LOW (ref 3.5–5.1)
Sodium: 141 mmol/L (ref 135–145)
Total Bilirubin: 0.7 mg/dL (ref 0.3–1.2)
Total Protein: 8.2 g/dL — ABNORMAL HIGH (ref 6.5–8.1)

## 2020-01-03 LAB — CBC
HCT: 37.5 % (ref 36.0–46.0)
Hemoglobin: 12.6 g/dL (ref 12.0–15.0)
MCH: 30.4 pg (ref 26.0–34.0)
MCHC: 33.6 g/dL (ref 30.0–36.0)
MCV: 90.4 fL (ref 80.0–100.0)
Platelets: 154 10*3/uL (ref 150–400)
RBC: 4.15 MIL/uL (ref 3.87–5.11)
RDW: 12.5 % (ref 11.5–15.5)
WBC: 5.1 10*3/uL (ref 4.0–10.5)
nRBC: 0 % (ref 0.0–0.2)

## 2020-01-03 LAB — URINALYSIS, COMPLETE (UACMP) WITH MICROSCOPIC
Bilirubin Urine: NEGATIVE
Glucose, UA: NEGATIVE mg/dL
Ketones, ur: 20 mg/dL — AB
Nitrite: NEGATIVE
Protein, ur: 100 mg/dL — AB
RBC / HPF: >50 RBC/hpf — ABNORMAL HIGH (ref 0–5)
Specific Gravity, Urine: 1.016 (ref 1.005–1.030)
WBC, UA: >50 WBC/hpf — ABNORMAL HIGH (ref 0–5)
pH: 6 (ref 5.0–8.0)

## 2020-01-03 LAB — LIPASE, BLOOD: Lipase: 19 U/L (ref 11–51)

## 2020-01-03 MED ORDER — CEPHALEXIN 500 MG PO CAPS: 500.0000 mg | ORAL_CAPSULE | Freq: Four times a day (QID) | ORAL | 0 refills | Status: AC

## 2020-01-03 MED ORDER — BACLOFEN 10 MG PO TABS
10.0000 mg | ORAL_TABLET | Freq: Once | ORAL | Status: AC
  Administered 2020-01-03: 10 mg via ORAL
  Filled 2020-01-03: qty 1

## 2020-01-03 MED ORDER — CEFTRIAXONE SODIUM 1 G IJ SOLR
1.0000 g | Freq: Once | INTRAMUSCULAR | Status: AC
  Administered 2020-01-03: 1 g via INTRAMUSCULAR
  Filled 2020-01-03: qty 10

## 2020-01-03 MED ORDER — LIDOCAINE HCL (PF) 1 % IJ SOLN
5.0000 mL | Freq: Once | INTRAMUSCULAR | Status: AC
  Administered 2020-01-03: 5 mL
  Filled 2020-01-03: qty 5

## 2020-01-03 NOTE — Telephone Encounter (Signed)
Patient's spouse is calling to check in on the patient while she is in [facility]. Please advise Amberly Livas (spouse) [phone]

## 2020-01-03 NOTE — ED Triage Notes (Signed)
Pt comes into the ED via EMS from home with c/o generalized weakness, HA, N/V since yesterday.. 180/92, 111HR, CBG121, temp 97.8, hx of lymphedema, MS.Marland Kitchen

## 2020-01-03 NOTE — ED Triage Notes (Addendum)
Pt comes via EMS from home with c/o abdominal pain. Pt states headache that has since resolved. Pt states yesterday she had N/V but none today.  Pt states she wants her catheter checked to make sure it is in right. Pt states foul odor with urine. Pt states pain to bottom. Pt states earlier she got up and just felt like her legs wouldn't move. Pt does have hx of MS.

## 2020-01-03 NOTE — ED Provider Notes (Signed)
[facility] Emergency Department Provider Note  ____________________________________________   I have reviewed the triage vital signs and the nursing notes.   HISTORY  Chief Complaint Urinary catheter problem  History limited by: Not Limited   HPI Rebecca [name] is a 59 y.o. female who presents to the emergency department today with primary concern for leakage around her urinary catheter.  Patient states that she had it replaced yesterday and is concerned that the person who replaced it did not do it correctly.  She states today she was noticed leakage around it.  She has concern for possible urinary tract infection as well given bad odor and change in color to the urine.  Additionally the patient has some complaints of feeling ill 2 days ago.  She states that she had had some nausea and vomiting as well as headache.  While the symptoms have resolved she is concerned because she was not able to take her medicine for the past 2 days.  Because of this she feels like she has been weak in her legs and was having difficulty with ambulation.   Records reviewed. Per medical record review patient has a history of MS  Past Medical History:  Diagnosis Date  . Allergy   . Aortic valve disease    Mild AS / AI - most recent Echo demonstrated tricuspid aortic valve.  . Bacterial endocarditis    History of .  Marland Kitchen Bilateral lower extremity edema    Noncardiac.  Chronic. LE Venous dopplers - negative for DVT.; Echocardiogram January 2016: Normal EF with normal wall motion and valve function. Only grade 1 diastolic dysfunction. EF 60-65%. Mild MR  . Breast cancer ([place]) 12-31-13   Right breast, 12:00, 1.5 cm, T1c,N0 invasive mammary carcinoma, triple negative. --> Rx with Chemo  . Cervical stenosis of spine   . Herpes zoster   . IBS (irritable bowel syndrome)   . Lymphedema    [facility] legs [facility]  . Multiple sclerosis ([place]) 2001   Walks from room to room @ home; but  Wheelchair when going out.  Marland Kitchen Neuromuscular disorder ([place])    MS  . PONV (postoperative nausea and vomiting)    Related to Fentanyl  . Seizures ([place])    Takes Keppra  . Syncope and collapse     Patient Active Problem List   Diagnosis Date Noted  . Muscle spasm 02/28/2019  . Post-menopausal bleeding 11/20/2018  . Ambulatory dysfunction 02/02/2018  . Abnormality of gait 11/17/2017  . 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 Bilateral lower extremity pain (primary) (bilateral) (right greater than left) 08/01/2016  . Chronic neck pain (secondary) (bilateral) ( left greater than right) 07/28/2016  . Neutropenia ([place]) 06/27/2016  . Chronic pain syndrome 06/03/2016  . Neurogenic bladder   . Neurogenic bowel   . Detrusor and sphincter dyssynergia   . Urinary incontinence   . Seizure disorder ([place])   . Slow transit constipation   . 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[place])   . Lymphedema   . Encounter for screening for cervical cancer 11/27/2015  . Preventative health care 11/25/2015  . History of breast cancer in female 10/28/2015  . Anemia 10/28/2015  . Thrombocytopenia ([place]) 10/28/2015  . Allergic rhinitis 10/28/2015  . IBS (irritable bowel syndrome) 10/28/2015  . 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 History of right mastectomy 10/24/2014  . Medicare annual wellness visit, subsequent 10/24/2014  . MS (multiple sclerosis) ([place]) 09/16/2014  . Hematoma complicating a procedure 03/06/2014  .  Primary cancer of right female breast ([place]) 01/10/2014  . SOB (shortness of breath) 03/01/2013  . Bilateral lower extremity edema   . 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 ANKLE SURGERY     Left  . ANKLE SURGERY    . ANTERIOR CERVICAL DECOMP/DISCECTOMY FUSION  11/17/2011   Procedure: ANTERIOR CERVICAL DECOMPRESSION/DISCECTOMY FUSION 2 LEVELS;  Surgeon: Erline Levine, MD;  Location: [place] [facility];  Service:  Neurosurgery;  Laterality: N/A;  Cervical Five-Six Six-Seven Anterior cervical decompression/diskectomy/fusion  . BREAST BIOPSY Right 12-31-13   invasive mammary  . BREAST SURGERY Right 02/03/2014   Right simple mastectomy with sentinel node biopsy.  . CHOLECYSTECTOMY    . COLONOSCOPY  2014  . HYSTEROSCOPY WITH D & C N/A 11/27/2018   Procedure: DILATATION AND CURETTAGE /HYSTEROSCOPY;  Surgeon: Gae Dry, MD;  Location: [facility] ORS;  Service: Gynecology;  Laterality: N/A;  . Lower extremity venous Dopplers  Feb 27, 2013   No LE DVT  . MASTECTOMY Right 2015  . Port a cath insertion Right 01/19/2010  . PORT-A-CATH REMOVAL     right  . PORT-A-CATH REMOVAL Right 09/03/2013   Procedure: REMOVAL PORT-A-CATH;  Surgeon: Conrad Arnold, MD;  Location: [place];  Service: Vascular;  Laterality: Right;  . SPINE SURGERY  Cervical steinois Dr. Vertell Limber 2014 I think?  . TONSILLECTOMY    . TRANSTHORACIC ECHOCARDIOGRAM  03/2013; 02/2014   a) Normal LV size and function with EF 60-65%.; Cannot exclude bicuspid aortic valve with mild AS and mild AI.; b) Normal EF with normal wall motion and valve function x Mild MR. G2 DD. EF 60-65%. Tricuspid AoV  . UPPER GI ENDOSCOPY  2014    Prior to Admission medications   Medication Sig Start Date End Date Taking? Authorizing Provider  amantadine (SYMMETREL) 100 MG capsule Take 1 capsule (100 mg total) by mouth 3 (three) times daily. 12/16/19   Penumalli, Earlean Polka, MD  baclofen (LIORESAL) 10 MG tablet TAKE 1 TO 2 TABLETS BY MOUTH 3 TIMES A DAY 10/30/19   Penumalli, Earlean Polka, MD  Cranberry 1000 MG CAPS Take by mouth.    [provider]  Disposable Gloves (ASSURANCE VINYL EXAM GLOVES) MISC Neurogenic bladder; MS; LON 99 months; for use for personal hygiene 12/26/17   [name], MD  fluticasone (FLONASE) 50 MCG/ACT nasal spray SPRAY 2 SPRAYS INTO EACH NOSTRIL EVERY DAY 04/08/19   Delsa Grana, PA-C  Glucosamine-Chondroitin (COSAMIN DS PO) Take 1 tablet by mouth 2  (two) times daily.    [provider]  ibuprofen (ADVIL) 600 MG tablet TAKE 1 TABLET (600 MG TOTAL) BY MOUTH EVERY 8 (EIGHT) HOURS AS NEEDED. 12/03/19   Delsa Grana, PA-C  Interferon Beta-1a (REBIF REBIDOSE) 44 MCG/0.5ML SOAJ Inject 0.5 mLs into the skin every Monday, Wednesday, and Friday. Patient taking differently: Inject 44 mcg into the skin every Monday, Wednesday, and Friday.  03/21/18   Penumalli, Earlean Polka, MD  levETIRAcetam (KEPPRA) 500 MG tablet Take 1 tablet (500 mg total) by mouth 2 (two) times daily. 12/16/19   Penumalli, Earlean Polka, MD  lidocaine-prilocaine (EMLA) cream Apply 1 application topically as needed. 09/20/19   Lloyd Huger, MD  loratadine (CLARITIN) 10 MG tablet Take 1 tablet (10 mg total) by mouth daily. 07/31/19   Delsa Grana, PA-C  Misc Natural Products (LEG VEIN & CIRCULATION) TABS Take 1 tablet by mouth 2 (two) times daily.  [provider]  Multiple Vitamins-Minerals (HAIR SKIN AND NAILS FORMULA) TABS Take 1 tablet by mouth 2 (two) times daily.     [provider]  Multiple Vitamins-Minerals (MULTIVITAMIN PO) Take 1 tablet by mouth daily.     [provider]  nystatin (MYCOSTATIN/NYSTOP) powder Apply 1 application topically 3 (three) times daily as needed. For rash/raw skin as needed 07/31/19   Delsa Grana, PA-C  oxybutynin (DITROPAN-XL) 10 MG 24 hr tablet Take 1 tablet (10 mg total) by mouth 2 (two) times daily. 09/11/19   Penumalli, Earlean Polka, MD  tiZANidine (ZANAFLEX) 4 MG tablet TAKE 1 TABLET (4 MG TOTAL) BY MOUTH 2 (TWO) TIMES DAILY. 09/05/19   Jamse Arn, MD  Turmeric 500 MG CAPS Take 500 mg by mouth daily.    [provider]    Allergies Fentanyl and Sulfa antibiotics  Family History  Problem Relation Age of Onset  . Cancer Father        skin  . Heart disease Father   . Heart attack Father        heart attack in his 59's  . Thyroid disease Sister   . Ovarian cancer Cousin   . Breast cancer Maternal  Aunt 60  . Breast cancer Maternal Grandmother 71  . Bladder Cancer Neg Hx   . Kidney cancer Neg Hx     Social History Social History   Tobacco Use  . Smoking status: Never Smoker  . Smokeless tobacco: Never Used  Vaping Use  . Vaping Use: Never used  Substance Use Topics  . Alcohol use: No  . Drug use: No    Review of Systems Constitutional: No fever/chills Eyes: No visual changes. ENT: No sore throat. Cardiovascular: Denies chest pain. Respiratory: Denies shortness of breath. Gastrointestinal: Positive for abdominal pain, nausea and vomiting.   Genitourinary: Positive for leakage around catheter and bad odor to the urine Musculoskeletal: Negative for back pain. Skin: Negative for rash. Neurological: Positive for headache.  ____________________________________________   PHYSICAL EXAM:  VITAL SIGNS: ED Triage Vitals [01/03/20 1740]  Enc Vitals Group     BP (!) 182/97     Pulse Rate (!) 108     Resp 18     Temp 98.1 F (36.7 C)     Temp src      SpO2 100 %     Weight 214 lb (97.1 kg)     Height 5\' 4"  (1.626 m)     Head Circumference      Peak Flow      Pain Score 5    Constitutional: Alert and oriented.  Eyes: Conjunctivae are normal.  ENT      Head: Normocephalic and atraumatic.      Nose: No congestion/rhinnorhea.      Mouth/Throat: Mucous membranes are moist.      Neck: No stridor. Hematological/Lymphatic/Immunilogical: No cervical lymphadenopathy. Cardiovascular: Normal rate, regular rhythm.  No murmurs, rubs, or gallops.  Respiratory: Normal respiratory effort without tachypnea nor retractions. Breath sounds are clear and equal bilaterally. No wheezes/rales/rhonchi. Gastrointestinal: Soft and non tender. No rebound. No guarding.  Genitourinary: Deferred Musculoskeletal: Normal range of motion in all extremities. No lower extremity edema. Neurologic:  Normal speech and language. No gross focal neurologic deficits are appreciated.  Skin:  Skin is  warm, dry and intact. No rash noted. Psychiatric: Mood and affect are normal. Speech and behavior are normal. Patient exhibits appropriate insight and judgment.  ____________________________________________    LABS (pertinent positives/negatives)  Lipase  19 CBC wbc 5.1, hgb 12.6, plt 154 CMP wnl except k 3.3, glu 115, t pro 8.2 UA turbid, moderate hgb dipstick, ketones 20, large leukocytes, >50 rbc wbc, few bacteria ____________________________________________   EKG  None  ____________________________________________    RADIOLOGY  None  ____________________________________________   PROCEDURES  Procedures  ____________________________________________   INITIAL IMPRESSION / ASSESSMENT AND PLAN / ED COURSE  Pertinent labs & imaging results that were available during my care of the patient were reviewed by me and considered in my medical decision making (see chart for details).   Patient presented to the emergency department today because of concern for leaking around urinary catheter. The patient states that it has also had a bad odor. Did have concern for UTI and UA is concerning for infection. Discussed this with the patient. Will give dose of antibiotics here. Additionally patient had concern for lower extremity weakness. Thought this might be due to her not receiving her baclofen today. The patient was given her baclofen and did feel improvement. Discussed with patient plan for discharge home with antibiotics. Patient states she does have an appointment with urology coming up.   ____________________________________________   FINAL CLINICAL IMPRESSION(S) / ED DIAGNOSES  Final diagnoses:  Lower urinary tract infectious disease     Note: This dictation was prepared with Dragon dictation. Any transcriptional errors that result from this process are unintentional     Nance Pear, MD 01/03/20 [phone]

## 2020-01-03 NOTE — Discharge Instructions (Signed)
Please seek medical attention for any high fevers, chest pain, shortness of breath, change in behavior, persistent vomiting, bloody stool or any other new or concerning symptoms.  

## 2020-01-03 NOTE — ED Notes (Signed)
EDP Rebecca [name] at bedside.

## 2020-01-03 NOTE — ED Notes (Signed)
Urine sample sent to lab

## 2020-01-03 NOTE — ED Notes (Addendum)
Pt assisted from chair to stretcher. Pt barely able to hold own weight upon standing. Took 3 nurses to assist pt during transfer. Pt unable to bend knees to assist with movement up in bed; repositioned by nurses instead. Pt calm. Resp reg/unlabored. History MS as previously noted. Pt's legs edematous.

## 2020-01-04 NOTE — ED Notes (Signed)
Pt gave verbal consent to this RN for DC.

## 2020-01-13 ENCOUNTER — Inpatient Hospital Stay: Payer: Medicare Other | Attending: Oncology

## 2020-01-13 ENCOUNTER — Inpatient Hospital Stay: Payer: Medicare Other

## 2020-01-13 ENCOUNTER — Other Ambulatory Visit: Payer: Self-pay

## 2020-01-13 DIAGNOSIS — Z803 Family history of malignant neoplasm of breast: Secondary | ICD-10-CM | POA: Insufficient documentation

## 2020-01-13 DIAGNOSIS — G629 Polyneuropathy, unspecified: Secondary | ICD-10-CM | POA: Insufficient documentation

## 2020-01-13 DIAGNOSIS — Z9011 Acquired absence of right breast and nipple: Secondary | ICD-10-CM | POA: Diagnosis not present

## 2020-01-13 DIAGNOSIS — Z8041 Family history of malignant neoplasm of ovary: Secondary | ICD-10-CM | POA: Insufficient documentation

## 2020-01-13 DIAGNOSIS — Z9221 Personal history of antineoplastic chemotherapy: Secondary | ICD-10-CM | POA: Insufficient documentation

## 2020-01-13 DIAGNOSIS — G35 Multiple sclerosis: Secondary | ICD-10-CM | POA: Diagnosis not present

## 2020-01-13 DIAGNOSIS — Z853 Personal history of malignant neoplasm of breast: Secondary | ICD-10-CM | POA: Insufficient documentation

## 2020-01-13 DIAGNOSIS — Z452 Encounter for adjustment and management of vascular access device: Secondary | ICD-10-CM | POA: Insufficient documentation

## 2020-01-13 DIAGNOSIS — I890 Lymphedema, not elsewhere classified: Secondary | ICD-10-CM | POA: Diagnosis not present

## 2020-01-13 DIAGNOSIS — Z95828 Presence of other vascular implants and grafts: Secondary | ICD-10-CM

## 2020-01-13 MED ORDER — HEPARIN SOD (PORK) LOCK FLUSH 100 UNIT/ML IV SOLN
INTRAVENOUS | Status: AC
  Filled 2020-01-13: qty 5

## 2020-01-13 MED ORDER — HEPARIN SOD (PORK) LOCK FLUSH 100 UNIT/ML IV SOLN
500.0000 [IU] | Freq: Once | INTRAVENOUS | Status: AC
  Administered 2020-01-13: 500 [IU] via INTRAVENOUS
  Filled 2020-01-13: qty 5

## 2020-01-13 MED ORDER — SODIUM CHLORIDE 0.9% FLUSH
10.0000 mL | Freq: Once | INTRAVENOUS | Status: DC
  Filled 2020-01-13: qty 10

## 2020-01-17 ENCOUNTER — Ambulatory Visit: Payer: Self-pay | Admitting: Urology

## 2020-01-28 ENCOUNTER — Other Ambulatory Visit: Payer: Self-pay | Admitting: Diagnostic Neuroimaging

## 2020-01-29 NOTE — Telephone Encounter (Signed)
Received fax from [facility] to urgently clarify quantity in Reb[facility] prescription. Currently written for 1 dose with refills. I clarified with their pharmacist to fill their standard quantity of 12 syringes or 1 box (1 month supply) with 11 refills. The pharmacy verbalized appreciation.

## 2020-01-30 ENCOUNTER — Ambulatory Visit: Payer: Self-pay | Admitting: *Deleted

## 2020-01-30 ENCOUNTER — Ambulatory Visit: Payer: Medicare Other | Admitting: Family Medicine

## 2020-01-30 NOTE — Telephone Encounter (Signed)
Home health nurse ,Angie from [facility] called to report UTI symptoms for patient. Patient has indwelling catheter, last changed on 01/06/20 with amber colored urine and strong odor noted when walking in patient home from urine smell. Patient with hx of MS and not feeling below abdomen. Denies fever. Symptoms noted x a week and 1/2. B/P elevated this am for 162/77. Not baseline for patient . RN requesting to collect urine sample today. Please advise if catheter should be changed prior to 02/05/20. Care advise given. RN , Angie awaiting to hear from PCP. Angie RN [phone]. Melissa, with [facility] aware of request to notify PCP.   Reason for Disposition . Urine smells bad  Answer Assessment - Initial Assessment Questions 1. SYMPTOMS: "What symptoms are you concerned about?"     Amber color of urine and odor 2. ONSET:  "When did the symptoms start?"     Week and 1/2 ago  3. FEVER: "Is there a fever?" If Yes, ask: "What is the temperature, how was it measured, and when did it start?"     Denies  4. ABDOMINAL PAIN: "Is there any abdominal pain?" (e.g., Scale 1-10; or mild, moderate, severe)     No feeling below abdomen due to MS 5. URINE COLOR: "What color is the urine?"  "Is there blood present in the urine?" (e.g., clear, yellow, cloudy, tea-colored, blood streaks, bright red)     Amber,cloudy  6. ONSET: "When was the catheter inserted?"     01/06/20 7. OTHER SYMPTOMS: "Do you have any other symptoms?" (e.g., back pain, bad urine odor)      Urine bad odor  8. PREGNANCY: "Is there any chance you are pregnant?" "When was your last menstrual period?"     na  Protocols used: URINARY CATHETER SYMPTOMS AND QUESTIONS-A-AH

## 2020-02-03 ENCOUNTER — Telehealth (INDEPENDENT_AMBULATORY_CARE_PROVIDER_SITE_OTHER): Payer: Medicare Other | Admitting: Family Medicine

## 2020-02-03 ENCOUNTER — Other Ambulatory Visit: Payer: Self-pay

## 2020-02-03 ENCOUNTER — Encounter: Payer: Self-pay | Admitting: Family Medicine

## 2020-02-03 VITALS — Temp 97.6°F

## 2020-02-03 DIAGNOSIS — G35 Multiple sclerosis: Secondary | ICD-10-CM

## 2020-02-03 DIAGNOSIS — R8290 Unspecified abnormal findings in urine: Secondary | ICD-10-CM

## 2020-02-03 DIAGNOSIS — Z711 Person with feared health complaint in whom no diagnosis is made: Secondary | ICD-10-CM | POA: Diagnosis not present

## 2020-02-03 DIAGNOSIS — N390 Urinary tract infection, site not specified: Secondary | ICD-10-CM

## 2020-02-03 DIAGNOSIS — N319 Neuromuscular dysfunction of bladder, unspecified: Secondary | ICD-10-CM

## 2020-02-03 DIAGNOSIS — R112 Nausea with vomiting, unspecified: Secondary | ICD-10-CM

## 2020-02-03 DIAGNOSIS — Z978 Presence of other specified devices: Secondary | ICD-10-CM

## 2020-02-03 DIAGNOSIS — G801 Spastic diplegic cerebral palsy: Secondary | ICD-10-CM

## 2020-02-03 MED ORDER — ONDANSETRON 4 MG PO TBDP: 4.0000 mg | ORAL_TABLET | Freq: Three times a day (TID) | ORAL | 1 refills | Status: DC | PRN

## 2020-02-03 NOTE — Telephone Encounter (Signed)
On schedule at 2 today

## 2020-02-03 NOTE — Progress Notes (Signed)
Name: Rebecca [name]   MRN: [id number]    DOB: [birth year]   Date:02/03/2020       Progress Note  Subjective:    Chief Complaint  Chief Complaint  Patient presents with  . Emesis    2 times this morning, no appetite    I connected with  [name]  on 02/03/20 at  2:00 PM EST by a video enabled telemedicine application and verified that I am speaking with the correct person using two identifiers.  I discussed the limitations of evaluation and management by telemedicine and the availability of in person appointments. The patient expressed understanding and agreed to proceed. Staff also discussed with the patient that there may be a patient responsible charge related to this service. Patient Location: home Provider Location: [facility] Additional Individuals present: none  HPI HH nurse was concerned for UTI last week - got urine sample last week - she states orders were called in?  She hasn't gotten results - I got message today due to being out of town for holidays with office closed - I cannot see any orders or results Pt is establishing with new urologist next month - cannot see any orders in [facility] or care everywhere She endorses cloudy urine and foul-smelling urine she has neurogenic bladder, she denies any pelvic or abdominal pain, flank pain, fever, chills, sweats.  She had a UTI in November was treated with Keflex  Some N/V early today she thinks from what she ate yesterday  No more right now, she reports appetite is good and no diarrhea   Some yeast she thinks around her catheter -she reports some weight thick discharge surrounding her catheter and urethra in the past she was treated for a yeast infection but she does not have any symptoms from this she denies any noted redness swelling or rash to the area or to surrounding groin she denies any vaginal symptoms  She is having the home health nurse come out for catheter change in 2 days     Patient Active Problem List    Diagnosis Date Noted  . Muscle spasm 02/28/2019  . Post-menopausal bleeding 11/20/2018  . Ambulatory dysfunction 02/02/2018  . Abnormality of gait 11/17/2017  . 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 Bilateral lower extremity pain (primary) (bilateral) (right greater than left) 08/01/2016  . Chronic neck pain (secondary) (bilateral) ( left greater than right) 07/28/2016  . Neutropenia (HCC) 06/27/2016  . Chronic pain syndrome 06/03/2016  . Neurogenic bladder   . Neurogenic bowel   . Detrusor and sphincter dyssynergia   . Urinary incontinence   . Seizure disorder (HCC)   . Slow transit constipation   . 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HCC)   . Lymphedema   . Encounter for screening for cervical cancer 11/27/2015  . Preventative health care 11/25/2015  . History of breast cancer in female 10/28/2015  . Anemia 10/28/2015  . Thrombocytopenia (HCC) 10/28/2015  . Allergic rhinitis 10/28/2015  . IBS (irritable bowel syndrome) 10/28/2015  . 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 History of right mastectomy 10/24/2014  . Medicare annual wellness visit, subsequent 10/24/2014  . MS (multiple sclerosis) (HCC) 09/16/2014  . Hematoma complicating a procedure 03/06/2014  . 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 SOB (shortness of breath) 03/01/2013  . Bilateral lower extremity edema   . 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HCC) 06/08/2011    Social History   Tobacco Use  . Smoking status: Never Smoker  .  Smokeless tobacco: Never Used  Substance Use Topics  . Alcohol use: No     Current Outpatient Medications:  .  amantadine (SYMMETREL) 100 MG capsule, Take 1 capsule (100 mg total) by mouth 3 (three) times daily., Disp: 270 capsule, Rfl: 4 .  baclofen (LIORESAL) 10 MG tablet, TAKE 1 TO 2 TABLETS BY MOUTH 3 TIMES A DAY, Disp: 540 tablet, Rfl: 2 .  Cranberry 1000 MG CAPS, Take by mouth., Disp: , Rfl:  .  fluticasone (FLONASE) 50 MCG/ACT nasal spray, SPRAY 2  SPRAYS INTO EACH NOSTRIL EVERY DAY, Disp: 48 mL, Rfl: 3 .  Glucosamine-Chondroitin (COSAMIN DS PO), Take 1 tablet by mouth 2 (two) times daily., Disp: , Rfl:  .  ibuprofen (ADVIL) 600 MG tablet, TAKE 1 TABLET (600 MG TOTAL) BY MOUTH EVERY 8 (EIGHT) HOURS AS NEEDED., Disp: 90 tablet, Rfl: 3 .  levETIRAcetam (KEPPRA) 500 MG tablet, Take 1 tablet (500 mg total) by mouth 2 (two) times daily., Disp: 180 tablet, Rfl: 4 .  lidocaine-prilocaine (EMLA) cream, Apply 1 application topically as needed., Disp: 30 g, Rfl: 0 .  loratadine (CLARITIN) 10 MG tablet, Take 1 tablet (10 mg total) by mouth daily., Disp: 90 tablet, Rfl: 3 .  Misc Natural Products (LEG VEIN & CIRCULATION) TABS, Take 1 tablet by mouth 2 (two) times daily. , Disp: , Rfl:  .  Multiple Vitamins-Minerals (HAIR SKIN AND NAILS FORMULA) TABS, Take 1 tablet by mouth 2 (two) times daily. , Disp: , Rfl:  .  Multiple Vitamins-Minerals (MULTIVITAMIN PO), Take 1 tablet by mouth daily. , Disp: , Rfl:  .  nystatin (MYCOSTATIN/NYSTOP) powder, Apply 1 application topically 3 (three) times daily as needed. For rash/raw skin as needed, Disp: 15 g, Rfl: 2 .  ondansetron (ZOFRAN ODT) 4 MG disintegrating tablet, Take 1 tablet (4 mg total) by mouth every 8 (eight) hours as needed for nausea or vomiting., Disp: 20 tablet, Rfl: 1 .  oxybutynin (DITROPAN-XL) 10 MG 24 hr tablet, Take 1 tablet (10 mg total) by mouth 2 (two) times daily., Disp: 180 tablet, Rfl: 4 .  REBIF REBIDOSE 44 MCG/0.5ML SOAJ, Inject  44 mcg Sub-q 3 times weekly Rotate site after each injection, Disp: 0.5 mL, Rfl: 11 .  tiZANidine (ZANAFLEX) 4 MG tablet, TAKE 1 TABLET (4 MG TOTAL) BY MOUTH 2 (TWO) TIMES DAILY., Disp: 180 tablet, Rfl: 1 .  Turmeric 500 MG CAPS, Take 500 mg by mouth daily., Disp: , Rfl:  .  Disposable Gloves (ASSURANCE VINYL EXAM GLOVES) MISC, Neurogenic bladder; MS; LON 99 months; for use for personal hygiene (Patient not taking: Reported on 02/03/2020), Disp: 50 each, Rfl:  11  Allergies  Allergen Reactions  . Fentanyl Nausea And Vomiting and Nausea Only    vomiting Was given in PACU x3 each time patient got sick vomiting Was given in PACU x3 each time patient got sick  . Sulfa Antibiotics Hives and Other (See Comments)    Light headed, over heated    I personally reviewed active problem list, medication list, allergies, family history, social history, health maintenance, notes from last encounter, lab results, imaging with the patient/caregiver today.   Review of Systems  10 Systems reviewed and are negative for acute change except as noted in the HPI.   Objective:   Virtual encounter, vitals limited, only able to obtain the following Today's Vitals   02/03/20 0859  Temp: 97.6 F (36.4 C)  TempSrc: Oral   There is no height or weight on file to  calculate BMI. Nursing Note and Vital Signs reviewed.  Physical Exam Patient alert, phonation clear, answering questions appropriately PE limited by telephone encounter    Assessment and Plan:     ICD-10-CM   1. Foul smelling urine  R82.90 Urinalysis, Routine w reflex microscopic    Urine Culture   retest urine after foley change this week - HH orders will be called in - UA and urine culture  2. Neurogenic bladder  N31.9 Urinalysis, Routine w reflex microscopic    Urine Culture  3. Chronic indwelling Foley catheter  Z97.8 Urinalysis, Routine w reflex microscopic    Urine Culture  4. Concern about female genital disease without diagnosis  Z71.1 WET PREP FOR [place], YEAST, CLUE   see if we can test for yeast with swab - concern for white discharge around foley - no pain/swelling/redness - test first before more meds/tx  5. Non-intractable vomiting with nausea, unspecified vomiting type  R11.2    episode this am, resolved, she reports normal appetite, no bowel changes, no abd pain, flank pain, fever, chills  6. 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HCC)  G80.1   7. MS (multiple sclerosis) ([place])  G35   8.  Complicated UTI (urinary tract infection)  N39.0    complexity of patient hx of UTI and her current med hx and comorbidities, lower threshold to tx with abx if any worsening sx -pt to call us    I have tried to coordinate with CMA to call at home health contact to give them the orders, urine should be refrigerated and should not be left at room temperature for more than 15 to 30 minutes. I did discuss with the patient treating the findings of the urinalysis, microscopy or urine culture Gave her Zofran to treat her nausea and vomiting though she states this has resolved already today encouraged her to push clear fluids advance diet slowly. She is not having any abdominal pain or flank pain, she seems fairly well with discussion over the phone.  We did discuss any concerning new's signs or symptoms that she needs to be seen for assessment immediately she verbalizes understanding of this particularly if she has pain uncontrollable nausea and vomiting, fever.  Orders will be sent in, I am not sure we will be able to test for yeast with any type of swab but they may be able to do a wet prep with any sterile swab suspended in a vial with saline?    I did review with the patient how she is likely to experience colonization of microbes in her catheter, and her leg bag and or around her catheter insertion site and without any signs of bacterial infection we should not overtreat with antimicrobials.  She does have urology f/up (new to est) in Jan  -Red flags and when to present for emergency care or RTC including fever >101.71F, chest pain, shortness of breath, new/worsening/un-resolving symptoms, reviewed with patient at time of visit. Follow up and care instructions discussed and provided in AVS. - I discussed the assessment and treatment plan with the patient. The patient was provided an opportunity to ask questions and all were answered. The patient agreed with the plan and demonstrated an understanding of  the instructions.  I provided 30 minutes+  of non-face-to-face time during this encounter.  Delsa Grana, PA-C 02/03/20 3:26 PM

## 2020-02-08 DIAGNOSIS — Z9181 History of falling: Secondary | ICD-10-CM | POA: Insufficient documentation

## 2020-02-08 DIAGNOSIS — Z87440 Personal history of urinary (tract) infections: Secondary | ICD-10-CM | POA: Insufficient documentation

## 2020-02-08 DIAGNOSIS — Z9010 Acquired absence of unspecified breast and nipple: Secondary | ICD-10-CM | POA: Insufficient documentation

## 2020-02-11 ENCOUNTER — Ambulatory Visit: Payer: Self-pay | Admitting: Urology

## 2020-02-17 ENCOUNTER — Telehealth: Payer: Self-pay | Admitting: Family Medicine

## 2020-02-17 ENCOUNTER — Encounter: Payer: Self-pay | Admitting: Family Medicine

## 2020-02-17 DIAGNOSIS — T83511A Infection and inflammatory reaction due to indwelling urethral catheter, initial encounter: Secondary | ICD-10-CM | POA: Insufficient documentation

## 2020-02-17 DIAGNOSIS — N390 Urinary tract infection, site not specified: Secondary | ICD-10-CM

## 2020-02-17 HISTORY — DX: Urinary tract infection, site not specified: N39.0

## 2020-02-17 MED ORDER — AMOXICILLIN-POT CLAVULANATE 875-125 MG PO TABS: 1.0000 | ORAL_TABLET | Freq: Two times a day (BID) | ORAL | 0 refills | Status: AC

## 2020-02-17 NOTE — Telephone Encounter (Signed)
Patient was seen for urinary tract issues but received no medication prescriptions.  Patient is unsure of the reason for no antibiotics and would like clarification.

## 2020-02-17 NOTE — Telephone Encounter (Signed)
Pt Urine and urine culture testing received by me today - greater than 100,000 CFU/mL of E. Coli  For complicated UTI with current C&S report effectiveness of keflex would require additional testing - unable to tx with bactrim due to allergies, best coverage med options include: cipro/levaquin, augmentin, macrobid With MSK/nerve pain/weakness may want to avoid fluoroquinolones?  Pt does have urology appt tomorrow She had resolved N/V at the time of our virtual appt w/o any other sx - per past urology note "Her urine only needs to have an UA and/or urine culture if she is having symptoms of gross hematuria, fevers/chills, nausea/vomiting, elevation in BP and/or pain. - per urology note 08/06/2018" She was seeking retesting due to prior positive testing from [facility] of urine odor - unclear if she truly requires abx tx -   Options are to tx with cipro/levaquin, augmentin, macrobid per C&S and pt allergy profile VS waiting to see what urology recommends. Would prefer augmentin to nitrofurantoin for possible complicated UTI, and prefer to avoid fluoroquinolones?  Since results and Abx have been reviewed end of business day Mon - will send in meds but defer to urology recommendations - pt would not be likely to get abx today anyway, we have been unable to reach the pt or her spouse with multiple attempts to call them back today.  Decided to send in augmentin to pharmacy with call pending still to pt  Defer to urology if they prefer alternative or no tx    ICD-10-CM   1. Urinary tract infection associated with indwelling urethral catheter, initial encounter ([place])  T83.511A amoxicillin-clavulanate (AUGMENTIN) 875-125 MG tablet   N39.0

## 2020-02-18 ENCOUNTER — Other Ambulatory Visit: Payer: Self-pay

## 2020-02-18 ENCOUNTER — Telehealth: Payer: Self-pay | Admitting: Oncology

## 2020-02-18 ENCOUNTER — Ambulatory Visit (INDEPENDENT_AMBULATORY_CARE_PROVIDER_SITE_OTHER): Payer: Medicare Other | Admitting: Urology

## 2020-02-18 VITALS — BP 160/86 | HR 80

## 2020-02-18 DIAGNOSIS — N319 Neuromuscular dysfunction of bladder, unspecified: Secondary | ICD-10-CM

## 2020-02-18 DIAGNOSIS — N3941 Urge incontinence: Secondary | ICD-10-CM

## 2020-02-18 DIAGNOSIS — R8271 Bacteriuria: Secondary | ICD-10-CM | POA: Diagnosis not present

## 2020-02-18 NOTE — Progress Notes (Signed)
02/18/2020 4:40 PM   [name] Mar 11, 1960 [id number]  Referring provider: Delsa Grana, PA-C [address] [place],   [address]  Chief Complaint  Patient presents with  . Follow-up    HPI: 60 year old female with a personal history of MS/neurogenic bladder who presents today with her husband discuss bladder management options and recurrent "urinary tract infections".  Extensive review of records today indicate that she initially presented to [place] back in 2019 with severe urinary urgency, urge incontinence but also incomplete bladder emptying and urinary retention.  She considered her options and ultimately elected to be managed with indwelling Foley catheter.  She was offered alternatives including estrogen suprapubic tube but declined.  Over the past several years, she is been treated on innumerable occasions for "urinary tract infection "associated with this catheter however her symptoms are foul-smelling urine and worsening pericatheter leakage.  Today, she reports that her caregivers who walking to the house and smell her urine which she describes as a foul strong odor.  They often tell her they are worried that the urine is infected.  She also reports that a day or 2 before her catheters to be changed, she will often have significant pericatheter leakage and worsening cloudiness of her catheter.  She occasionally has to get it changed early due to this.  She also mentions today that many of her caretakers are worried about it.  Urethral discharge around her catheter which they describe as a clear mucus.  She has never had cystoscopy or urodynamics.  She has never been offered Botox or augment.  Her goals are to "be normal" and possibly where normal underwear again.  She is also bothered by the smell of her urine.  She is on oxybutynin 10 mg XL twice daily.  Most recent upper tract imaging in the form of renal ultrasound in 2020.  This showed small  nonobstructing stones otherwise unremarkable.  PMH: Past Medical History:  Diagnosis Date  . Allergy   . Aortic valve disease    Mild AS / AI - most recent Echo demonstrated tricuspid aortic valve.  . Bacterial endocarditis    History of .  Marland Kitchen Bilateral lower extremity edema    Noncardiac.  Chronic. LE Venous dopplers - negative for DVT.; Echocardiogram January 2016: Normal EF with normal wall motion and valve function. Only grade 1 diastolic dysfunction. EF 60-65%. Mild MR  . Breast cancer ([place]) 12-31-13   Right breast, 12:00, 1.5 cm, T1c,N0 invasive mammary carcinoma, triple negative. --> Rx with Chemo  . Cervical stenosis of spine   . Herpes zoster   . IBS (irritable bowel syndrome)   . Lymphedema    has legs wrapped at [facility]  . Multiple sclerosis ([place]) 2001   Walks from room to room @ home; but Wheelchair when going out.  Marland Kitchen Neuromuscular disorder ([place])    MS  . PONV (postoperative nausea and vomiting)    Related to Fentanyl  . Seizures ([place])    Takes Keppra  . Syncope and collapse     Surgical History: Past Surgical History:  Procedure Laterality Date  . ANKLE SURGERY     Left  . ANKLE SURGERY    . ANTERIOR CERVICAL DECOMP/DISCECTOMY FUSION  11/17/2011   Procedure: ANTERIOR CERVICAL DECOMPRESSION/DISCECTOMY FUSION 2 LEVELS;  Surgeon: Erline Levine, MD;  Location: [facility];  Service: Neurosurgery;  Laterality: N/A;  Cervical Five-Six Six-Seven Anterior cervical decompression/diskectomy/fusion  . BREAST BIOPSY Right 12-31-13   invasive mammary  .  BREAST SURGERY Right 02/03/2014   Right simple mastectomy with sentinel node biopsy.  . CHOLECYSTECTOMY    . COLONOSCOPY  2014  . HYSTEROSCOPY WITH D & C N/A 11/27/2018   Procedure: DILATATION AND CURETTAGE /HYSTEROSCOPY;  Surgeon: Gae Dry, MD;  Location: [facility] ORS;  Service: Gynecology;  Laterality: N/A;  . Lower extremity venous Dopplers  Feb 27, 2013   No LE DVT  . MASTECTOMY Right 2015  . Port a cath insertion  Right 01/19/2010  . PORT-A-CATH REMOVAL     right  . PORT-A-CATH REMOVAL Right 09/03/2013   Procedure: REMOVAL PORT-A-CATH;  Surgeon: Conrad Cartwright, MD;  Location: [facility];  Service: Vascular;  Laterality: Right;  . SPINE SURGERY  Cervical steinois Dr. Vertell Limber 2014 I think?  . TONSILLECTOMY    . TRANSTHORACIC ECHOCARDIOGRAM  03/2013; 02/2014   a) Normal LV size and function with EF 60-65%.; Cannot exclude bicuspid aortic valve with mild AS and mild AI.; b) Normal EF with normal wall motion and valve function x Mild MR. G2 DD. EF 60-65%. Tricuspid AoV  . UPPER GI ENDOSCOPY  2014    Home Medications:  Allergies as of 02/18/2020      Reactions   Fentanyl Nausea And Vomiting, Nausea Only   vomiting Was given in PACU x3 each time patient got sick vomiting Was given in PACU x3 each time patient got sick   Sulfa Antibiotics Hives, Other (See Comments)   Light headed, over heated      Medication List       Accurate as of February 18, 2020  4:40 PM. If you have any questions, ask your nurse or doctor.        amantadine 100 MG capsule Commonly known as: SYMMETREL Take 1 capsule (100 mg total) by mouth 3 (three) times daily.   amoxicillin-clavulanate 875-125 MG tablet Commonly known as: AUGMENTIN Take 1 tablet by mouth 2 (two) times daily for 7 days.   Assurance Vinyl Exam Gloves Misc Neurogenic bladder; MS; LON 99 months; for use for personal hygiene   baclofen 10 MG tablet Commonly known as: LIORESAL TAKE 1 TO 2 TABLETS BY MOUTH 3 TIMES A DAY   COSAMIN DS PO Take 1 tablet by mouth 2 (two) times daily.   Cranberry 1000 MG Caps Take by mouth.   fluticasone 50 MCG/ACT nasal spray Commonly known as: FLONASE SPRAY 2 SPRAYS INTO EACH NOSTRIL EVERY DAY   Hair Skin and Nails Formula Tabs Take 1 tablet by mouth 2 (two) times daily.   ibuprofen 600 MG tablet Commonly known as: ADVIL TAKE 1 TABLET (600 MG TOTAL) BY MOUTH EVERY 8 (EIGHT) HOURS AS NEEDED.   Leg Vein & Circulation  Tabs Take 1 tablet by mouth 2 (two) times daily.   levETIRAcetam 500 MG tablet Commonly known as: KEPPRA Take 1 tablet (500 mg total) by mouth 2 (two) times daily.   lidocaine-prilocaine cream Commonly known as: EMLA Apply 1 application topically as needed.   loratadine 10 MG tablet Commonly known as: CLARITIN Take 1 tablet (10 mg total) by mouth daily.   MULTIVITAMIN PO Take 1 tablet by mouth daily.   nystatin powder Commonly known as: MYCOSTATIN/NYSTOP Apply 1 application topically 3 (three) times daily as needed. For rash/raw skin as needed   ondansetron 4 MG disintegrating tablet Commonly known as: Zofran ODT Take 1 tablet (4 mg total) by mouth every 8 (eight) hours as needed for nausea or vomiting.   oxybutynin 10 MG 24 hr tablet  Commonly known as: DITROPAN-XL Take 1 tablet (10 mg total) by mouth 2 (two) times daily.   Rebif Rebidose 44 MCG/0.5ML Soaj Generic drug: Interferon Beta-1a Inject  44 mcg Sub-q 3 times weekly Rotate site after each injection   tiZANidine 4 MG tablet Commonly known as: ZANAFLEX TAKE 1 TABLET (4 MG TOTAL) BY MOUTH 2 (TWO) TIMES DAILY.   Turmeric 500 MG Caps Take 500 mg by mouth daily.       Allergies:  Allergies  Allergen Reactions  . Fentanyl Nausea And Vomiting and Nausea Only    vomiting Was given in PACU x3 each time patient got sick vomiting Was given in PACU x3 each time patient got sick  . Sulfa Antibiotics Hives and Other (See Comments)    Light headed, over heated    Family History: Family History  Problem Relation Age of Onset  . Cancer Father        skin  . Heart disease Father   . Heart attack Father        heart attack in his 66's  . Thyroid disease Sister   . Ovarian cancer Cousin   . Breast cancer Maternal Aunt 60  . Breast cancer Maternal Grandmother 66  . Bladder Cancer Neg Hx   . Kidney cancer Neg Hx     Social History:  reports that she has never smoked. She has never used smokeless tobacco. She  reports that she does not drink alcohol and does not use drugs.   Physical Exam: BP (!) 160/86   Pulse 80   Constitutional:  Alert and oriented, No acute distress.  Accompanied by husband today.  In wheelchair with lower extremity paralysis.  Foley bag with clear urine which is malodorous. HEENT: [place] AT, moist mucus membranes.  Trachea midline, no masses. Cardiovascular: No clubbing, cyanosis, or edema. Respiratory: Normal respiratory effort, no increased work of breathing. Skin: No rashes, bruises or suspicious lesions. Neurologic: Grossly intact, no focal deficits, moving all 4 extremities. Psychiatric: Normal mood and affect  Assessment & Plan:    1. Chronic bacteriuria We had an extensive conversation today about the difference between chronic bacterial colonization and true urinary tract infection.  She often seeks care by other providers including her primary care for symptoms that are secondary to chronic bacteriuria including strong smelling urine and Foley catheter issues rather than true systemic infection.  We carefully went through each symptom today and discussed how these should not be treated with antibiotics.  We discussed my concern for developing multidrug-resistant organism as well as other complications from chronic antibiotic use including C. difficile amongst others.  She was prescribed a course of antibiotics which she was planning to pick up today for foul-smelling urine and I encouraged her not to do this.  I will reach out to her primary care with some education as well on this.  We did discuss other signs or symptoms which antibiotics are appropriate to treat including fevers, chills, unexplained malaise without any other source amongst others.  She has had this education previously but this was stressed extensively again today.  - Ambulatory referral to Urology  2. Neurogenic bladder Related to relapsing remitting MS  Recently, her MS symptoms have changed  and thus is unclear whether or not she is still in urinary retention or needs a Foley catheter to this point in time.  We discussed her overall goals of care which is to improve her malodorous urine and to wear normal underwear/be dry.  At this  point, I do think that she would benefit from urodynamics to assess her overall bladder capacity, function, severity of overactivity as well as ability to empty.  This helped guide management.  She may be candidate for Botox either low or high dose and also consideration of alternative urinary diversions if she is not able to empty his.  She does not think that she be able to self cath but may be a good candidate for suprapubic tube or possibly even consideration about augment which was also briefly discussed today.  3. Urge incontinence Primarily bothered by urgency frequency and lack of urinary control related to urge incontinence and [place] urethral catheter leakage.  For the time being, continue oxybutynin but may consider Botox or other adjuvant therapies down the road based on urodynamics.   F/u after urodynamics  Hollice Espy, MD  [facility] [address] [place], [place] [address] [phone]  I spent 50 total minutes on the day of the encounter including pre-visit review of the medical record, face-to-face time with the patient, and post visit ordering of labs/imaging/tests.

## 2020-02-18 NOTE — Telephone Encounter (Signed)
Returned call to patient to move [facility] appt. Pt did not answer and no VM available.

## 2020-02-19 NOTE — Telephone Encounter (Signed)
Called left message for patient to call office and check pharmacy for script

## 2020-02-21 ENCOUNTER — Inpatient Hospital Stay: Payer: Medicare Other

## 2020-02-24 ENCOUNTER — Inpatient Hospital Stay: Payer: Medicare Other

## 2020-02-25 NOTE — Telephone Encounter (Unsigned)
Copied from [place] [id number]. Topic: General - Other >> Feb 20, 2020  1:32 PM Rainey Pines A wrote: Linton Rump PT from [facility] went to see patient for PT but patient missed visit due to her caregiver having symptoms of covid as well as her husband. Please advise  Best contact [phone]

## 2020-02-25 NOTE — Telephone Encounter (Signed)
Called Ameydis NA

## 2020-02-26 ENCOUNTER — Inpatient Hospital Stay: Payer: Medicare Other

## 2020-03-04 ENCOUNTER — Telehealth: Payer: Self-pay | Admitting: Family Medicine

## 2020-03-04 DIAGNOSIS — N760 Acute vaginitis: Secondary | ICD-10-CM

## 2020-03-04 MED ORDER — FLUCONAZOLE 150 MG PO TABS: 150.0000 mg | ORAL_TABLET | ORAL | 0 refills | Status: DC | PRN

## 2020-03-04 NOTE — Telephone Encounter (Signed)
Nurse notified  

## 2020-03-04 NOTE — Telephone Encounter (Signed)
Anderson Malta, from [facility], calling stating that she noticed on the pts catheter change today that she noticed the pts vagina was swollen and red with some discharge. She states that the pt is prone to yeast infections and is requesting to have something sent in for pt. Please advise.  [phone]    [id number] - [place], [place] [address]  [address] [address]  Phone: [phone] Fax: [phone]  Hours: Not open 24 hours

## 2020-03-05 ENCOUNTER — Inpatient Hospital Stay: Payer: Medicare Other

## 2020-03-09 ENCOUNTER — Inpatient Hospital Stay: Payer: Medicare Other | Attending: Oncology

## 2020-03-09 DIAGNOSIS — G35 Multiple sclerosis: Secondary | ICD-10-CM | POA: Insufficient documentation

## 2020-03-09 DIAGNOSIS — Z853 Personal history of malignant neoplasm of breast: Secondary | ICD-10-CM | POA: Diagnosis present

## 2020-03-09 DIAGNOSIS — Z803 Family history of malignant neoplasm of breast: Secondary | ICD-10-CM | POA: Insufficient documentation

## 2020-03-09 DIAGNOSIS — I890 Lymphedema, not elsewhere classified: Secondary | ICD-10-CM | POA: Insufficient documentation

## 2020-03-09 DIAGNOSIS — Z9221 Personal history of antineoplastic chemotherapy: Secondary | ICD-10-CM | POA: Insufficient documentation

## 2020-03-09 DIAGNOSIS — Z452 Encounter for adjustment and management of vascular access device: Secondary | ICD-10-CM | POA: Diagnosis not present

## 2020-03-09 DIAGNOSIS — G629 Polyneuropathy, unspecified: Secondary | ICD-10-CM | POA: Diagnosis not present

## 2020-03-09 DIAGNOSIS — Z95828 Presence of other vascular implants and grafts: Secondary | ICD-10-CM

## 2020-03-09 DIAGNOSIS — Z9011 Acquired absence of right breast and nipple: Secondary | ICD-10-CM | POA: Diagnosis not present

## 2020-03-09 DIAGNOSIS — Z8041 Family history of malignant neoplasm of ovary: Secondary | ICD-10-CM | POA: Insufficient documentation

## 2020-03-09 MED ORDER — HEPARIN SOD (PORK) LOCK FLUSH 100 UNIT/ML IV SOLN
INTRAVENOUS | Status: AC
  Filled 2020-03-09: qty 5

## 2020-03-09 MED ORDER — HEPARIN SOD (PORK) LOCK FLUSH 100 UNIT/ML IV SOLN
500.0000 [IU] | Freq: Once | INTRAVENOUS | Status: AC
  Administered 2020-03-09: 500 [IU] via INTRAVENOUS
  Filled 2020-03-09: qty 5

## 2020-03-09 MED ORDER — SODIUM CHLORIDE 0.9% FLUSH
10.0000 mL | INTRAVENOUS | Status: DC | PRN
  Administered 2020-03-09: 10 mL via INTRAVENOUS
  Filled 2020-03-09: qty 10

## 2020-03-12 ENCOUNTER — Encounter: Payer: Medicare Other | Admitting: Physical Medicine & Rehabilitation

## 2020-03-13 ENCOUNTER — Telehealth: Payer: Self-pay | Admitting: Family Medicine

## 2020-03-13 NOTE — Telephone Encounter (Signed)
Hillary Bow Calling From [facility] is calling to report that the patient's BP 170/90, and her bowel are a clear jelly like substance CB- [phone]

## 2020-03-16 ENCOUNTER — Telehealth: Payer: Self-pay

## 2020-03-16 NOTE — Telephone Encounter (Signed)
appt scheduled

## 2020-03-16 NOTE — Telephone Encounter (Signed)
Copied from [place] [id number]. Topic: General - Other >> Mar 16, 2020  1:21 PM Mcneil, Ja-Kwan wrote: Reason for CRM: Pt stated she has been having a problem with a clear jelly like substance coming out like diarrhea and she would like a call back. Pt stated she is not sure what to do and she was told she may need to be referred to a GI. Pt requests call back. Cb# [phone]

## 2020-03-16 NOTE — Telephone Encounter (Signed)
FYI

## 2020-03-18 ENCOUNTER — Telehealth: Payer: Self-pay

## 2020-03-18 NOTE — Telephone Encounter (Signed)
Copied from [place] [phone]. Topic: General - Other >> Mar 18, 2020 12:21 PM Yvette Rack wrote: Reason for CRM: Crystal with [facility] called regarding some orders that were faxed over to be signed and returned. Cb# [phone]

## 2020-03-18 NOTE — Telephone Encounter (Signed)
Called left vm to refax any orders needing signed

## 2020-03-27 DIAGNOSIS — N319 Neuromuscular dysfunction of bladder, unspecified: Secondary | ICD-10-CM | POA: Insufficient documentation

## 2020-03-29 ENCOUNTER — Other Ambulatory Visit: Payer: Self-pay | Admitting: Family Medicine

## 2020-03-30 ENCOUNTER — Telehealth: Payer: Self-pay

## 2020-03-30 NOTE — Telephone Encounter (Signed)
Incoming voicemail on triage line from patient regarding her catheter. Patient reports being unable to flush her catheter, she has leaking urine and that her nurse was coming to her home this afternoon. Tried to return call, no answer, no VM set up.

## 2020-04-06 ENCOUNTER — Encounter: Payer: Medicare Other | Admitting: Physical Medicine & Rehabilitation

## 2020-04-07 ENCOUNTER — Ambulatory Visit: Payer: Self-pay | Admitting: Urology

## 2020-04-10 ENCOUNTER — Ambulatory Visit: Payer: Medicare Other | Admitting: Family Medicine

## 2020-04-14 ENCOUNTER — Ambulatory Visit (INDEPENDENT_AMBULATORY_CARE_PROVIDER_SITE_OTHER): Payer: Medicare Other | Admitting: Family Medicine

## 2020-04-14 ENCOUNTER — Encounter: Payer: Self-pay | Admitting: Family Medicine

## 2020-04-14 ENCOUNTER — Other Ambulatory Visit: Payer: Self-pay

## 2020-04-14 VITALS — BP 170/90 | HR 77 | Temp 97.6°F | Resp 16 | Ht 69.0 in | Wt 214.0 lb

## 2020-04-14 DIAGNOSIS — R030 Elevated blood-pressure reading, without diagnosis of hypertension: Secondary | ICD-10-CM | POA: Diagnosis not present

## 2020-04-14 DIAGNOSIS — K580 Irritable bowel syndrome with diarrhea: Secondary | ICD-10-CM | POA: Diagnosis not present

## 2020-04-14 DIAGNOSIS — R197 Diarrhea, unspecified: Secondary | ICD-10-CM | POA: Diagnosis not present

## 2020-04-14 DIAGNOSIS — R195 Other fecal abnormalities: Secondary | ICD-10-CM | POA: Diagnosis not present

## 2020-04-14 NOTE — Assessment & Plan Note (Addendum)
Chronic with recent worsening and difficult to control. Abd exam benign. Possible MS and neurogenic bowel contributing but with report of increased mucus and change in appearance, recommend ruling out infectious/inflammatory causes. Will obtain CMP/CBC today. Discussed possibility of stool testing for infectious pathogens but given referral to GI, patient elects to defer today. Will refer to GI for further evaluation, likely stool testing and potential repeat scope.

## 2020-04-14 NOTE — Progress Notes (Signed)
   SUBJECTIVE:   CHIEF COMPLAINT / HPI:   ABDOMINAL ISSUES - h/o IBS, neurogenic bowel, MS - reporting ongoing intermittent clear liquid, thick goopy BM with mucus. Difficult to control. Happened 4 days last week. - typically has BM once every 3rd day but when has diarrhea bouts, multiple episodes per day. - follows with Urology for chronic bacteruria. - denies new or suspicious foods - does not appear to be associated with certain foods or time of day. Duration: chronic Denies pain. Episode duration: when has bouts, few days in a row. Frequency: a few times a month Alleviating factors: none Aggravating factors: none Treatments attempted: none Constipation: intermittent Diarrhea: yes Episodes of diarrhea/day: TMTC Mucous in the stool: yes Heartburn: occasionally Bloating:no Nausea: last had a few weeks ago. not usually. Vomiting: no Melena or hematochezia: no Rash: no Jaundice: no Fever: no Weight loss: no  Prior cholecystectomy years ago in [place].   Colonoscopy and Upper GI study 2014 - no results available for review. Patient reports no abnormalities.   OBJECTIVE:   BP (!) 170/90   Pulse 77   Temp 97.6 F (36.4 C) (Oral)   Resp 16   Ht 5\' 9"  (1.753 m)   Wt 214 lb (97.1 kg)   SpO2 98%   BMI 31.60 kg/m   Gen: chronically ill appearing, in NAD Card: RRR Lungs: CTAB Abd: soft, nonTTP, nondistended. No guarding or rebound. +BS.   ASSESSMENT/PLAN:   Diarrhea Chronic with recent worsening and difficult to control. Abd exam benign. Possible MS and neurogenic bowel contributing but with report of increased mucus and change in appearance, recommend ruling out infectious/inflammatory causes. Will obtain CMP/CBC today. Discussed possibility of stool testing for infectious pathogens but given referral to GI, patient elects to defer today. Will refer to GI for further evaluation, likely stool testing and potential repeat scope.   Elevated BP without diagnosis of  hypertension Elevated today and on recheck. Per chart review has been labile in the past. Will have patient keep log at home when [facility] nurse comes to check and bring log to f/u in 4 weeks.      Myles Gip, DO

## 2020-04-14 NOTE — Assessment & Plan Note (Signed)
Elevated today and on recheck. Per chart review has been labile in the past. Will have patient keep log at home when [facility] nurse comes to check and bring log to f/u in 4 weeks.

## 2020-04-14 NOTE — Patient Instructions (Addendum)
It was great to see you!  Our plans for today:  - We are sending you to the GI doctor, let us know if you don't hear about an appointment in the next few weeks.   - Try taking imodium as needed for your diarrhea.   We are checking some labs today, we will release these results to your MyChart.  Take care and seek immediate care sooner if you develop any concerns.   Dr. Ky Barban

## 2020-04-15 LAB — CBC WITH DIFFERENTIAL/PLATELET
Absolute Monocytes: 428 cells/uL (ref 200–950)
Basophils Absolute: 8 cells/uL (ref 0–200)
Basophils Relative: 0.2 %
Eosinophils Absolute: 108 cells/uL (ref 15–500)
Eosinophils Relative: 2.7 %
HCT: 38.1 % (ref 35.0–45.0)
Hemoglobin: 12.8 g/dL (ref 11.7–15.5)
Lymphs Abs: 964 cells/uL (ref 850–3900)
MCH: 30.4 pg (ref 27.0–33.0)
MCHC: 33.6 g/dL (ref 32.0–36.0)
MCV: 90.5 fL (ref 80.0–100.0)
MPV: 12.3 fL (ref 7.5–12.5)
Monocytes Relative: 10.7 %
Neutro Abs: 2492 cells/uL (ref 1500–7800)
Neutrophils Relative %: 62.3 %
Platelets: 126 10*3/uL — ABNORMAL LOW (ref 140–400)
RBC: 4.21 10*6/uL (ref 3.80–5.10)
RDW: 12.1 % (ref 11.0–15.0)
Total Lymphocyte: 24.1 %
WBC: 4 10*3/uL (ref 3.8–10.8)

## 2020-04-15 LAB — COMPREHENSIVE METABOLIC PANEL
AG Ratio: 1.3 (calc) (ref 1.0–2.5)
ALT: 25 U/L (ref 6–29)
AST: 22 U/L (ref 10–35)
Albumin: 4.2 g/dL (ref 3.6–5.1)
Alkaline phosphatase (APISO): 88 U/L (ref 37–153)
BUN: 15 mg/dL (ref 7–25)
CO2: 26 mmol/L (ref 20–32)
Calcium: 9.1 mg/dL (ref 8.6–10.4)
Chloride: 105 mmol/L (ref 98–110)
Creat: 0.54 mg/dL (ref 0.50–0.99)
Globulin: 3.2 g/dL (calc) (ref 1.9–3.7)
Glucose, Bld: 81 mg/dL (ref 65–99)
Potassium: 3.7 mmol/L (ref 3.5–5.3)
Sodium: 141 mmol/L (ref 135–146)
Total Bilirubin: 0.4 mg/dL (ref 0.2–1.2)
Total Protein: 7.4 g/dL (ref 6.1–8.1)

## 2020-04-20 ENCOUNTER — Inpatient Hospital Stay: Payer: Medicare Other | Attending: Oncology

## 2020-04-20 DIAGNOSIS — Z95828 Presence of other vascular implants and grafts: Secondary | ICD-10-CM

## 2020-04-20 DIAGNOSIS — Z853 Personal history of malignant neoplasm of breast: Secondary | ICD-10-CM | POA: Insufficient documentation

## 2020-04-20 DIAGNOSIS — Z452 Encounter for adjustment and management of vascular access device: Secondary | ICD-10-CM | POA: Insufficient documentation

## 2020-04-20 MED ORDER — HEPARIN SOD (PORK) LOCK FLUSH 100 UNIT/ML IV SOLN
INTRAVENOUS | Status: AC
  Filled 2020-04-20: qty 5

## 2020-04-20 MED ORDER — SODIUM CHLORIDE 0.9% FLUSH
10.0000 mL | Freq: Once | INTRAVENOUS | Status: AC
  Administered 2020-04-20: 10 mL via INTRAVENOUS
  Filled 2020-04-20: qty 10

## 2020-04-20 MED ORDER — HEPARIN SOD (PORK) LOCK FLUSH 100 UNIT/ML IV SOLN
500.0000 [IU] | Freq: Once | INTRAVENOUS | Status: AC
  Administered 2020-04-20: 500 [IU] via INTRAVENOUS
  Filled 2020-04-20: qty 5

## 2020-04-21 ENCOUNTER — Telehealth: Payer: Self-pay

## 2020-04-21 NOTE — Telephone Encounter (Signed)
Copied from [place] [id number]. Topic: General - Other >> Apr 21, 2020  4:06 PM Tessa Lerner A wrote: Reason for CRM: Patient would like to be contacted by a member of clinical staff regarding their catheter/home healthcare  Patient is currently cared for by [facility], experiencing difficulty with their catheter and unsatisfied with the service   Please contact to further advise

## 2020-04-23 ENCOUNTER — Other Ambulatory Visit: Payer: Self-pay | Admitting: Urology

## 2020-04-28 ENCOUNTER — Ambulatory Visit (INDEPENDENT_AMBULATORY_CARE_PROVIDER_SITE_OTHER): Payer: Medicare Other | Admitting: Urology

## 2020-04-28 ENCOUNTER — Encounter: Payer: Self-pay | Admitting: Urology

## 2020-04-28 ENCOUNTER — Other Ambulatory Visit: Payer: Self-pay

## 2020-04-28 VITALS — BP 153/87 | HR 83

## 2020-04-28 DIAGNOSIS — N319 Neuromuscular dysfunction of bladder, unspecified: Secondary | ICD-10-CM | POA: Diagnosis not present

## 2020-04-28 DIAGNOSIS — R8271 Bacteriuria: Secondary | ICD-10-CM

## 2020-04-29 ENCOUNTER — Telehealth: Payer: Self-pay

## 2020-04-29 ENCOUNTER — Other Ambulatory Visit: Payer: Self-pay | Admitting: Family Medicine

## 2020-04-29 DIAGNOSIS — K592 Neurogenic bowel, not elsewhere classified: Secondary | ICD-10-CM

## 2020-04-29 DIAGNOSIS — G801 Spastic diplegic cerebral palsy: Secondary | ICD-10-CM

## 2020-04-29 DIAGNOSIS — G8220 Paraplegia, unspecified: Secondary | ICD-10-CM

## 2020-04-29 DIAGNOSIS — N319 Neuromuscular dysfunction of bladder, unspecified: Secondary | ICD-10-CM

## 2020-04-29 DIAGNOSIS — G40909 Epilepsy, unspecified, not intractable, without status epilepticus: Secondary | ICD-10-CM

## 2020-04-29 DIAGNOSIS — G35 Multiple sclerosis: Secondary | ICD-10-CM

## 2020-04-29 NOTE — Telephone Encounter (Signed)
Copied from [place] [id number]. Topic: General - Other >> Apr 29, 2020 12:19 PM Tessa Lerner A wrote: Reason for CRM: Patient would like to be contacted regarding home health care  Patient would like assistance from their PCP's office with coordination of services  Patient is looking to be seen regularly by a specific home healthcare worker instead of rotating home health aides  Patient was working with [employer] and has been advised to seek PCP's help with communication and reestablishing care with [facility]  Please contact to advise further

## 2020-04-29 NOTE — Telephone Encounter (Signed)
Pt called states she had bad experience with current [facility] home health nurse with her catheter, but also receives OT through them and since had bad experience she called and canceled care without thinking.  She would now like new referral for a different agency for nurse for catheter care and OT for her MS.  Can you place new referral since you recently saw her or does she need an appointment?  Also we did not do her orders for her catheter does that need to come from her urologist and [place] just do OT?

## 2020-04-29 NOTE — Telephone Encounter (Signed)
I placed [facility] orders for OT and nursing for catheter care. She should probably get orders for catheter supplies from her urologist if that's who she's been getting them from.

## 2020-05-05 ENCOUNTER — Other Ambulatory Visit: Payer: Self-pay

## 2020-05-05 ENCOUNTER — Ambulatory Visit (INDEPENDENT_AMBULATORY_CARE_PROVIDER_SITE_OTHER): Payer: Medicare Other | Admitting: Gastroenterology

## 2020-05-05 ENCOUNTER — Encounter: Payer: Self-pay | Admitting: Gastroenterology

## 2020-05-05 VITALS — BP 173/97 | HR 80 | Temp 97.8°F | Ht 69.0 in

## 2020-05-05 DIAGNOSIS — R1319 Other dysphagia: Secondary | ICD-10-CM

## 2020-05-05 DIAGNOSIS — R197 Diarrhea, unspecified: Secondary | ICD-10-CM | POA: Diagnosis not present

## 2020-05-05 MED ORDER — METAMUCIL 28 % PO PACK: 1.0000 | PACK | Freq: Every day | ORAL | 0 refills | Status: AC

## 2020-05-05 NOTE — Progress Notes (Signed)
Rebecca [name] [address]  [place], [place] [address]  Main: [phone]  Fax: [phone]   Gastroenterology Consultation  Referring Provider:     Myles Gip, DO Primary Care Physician:  Delsa Grana, PA-C Reason for Consultation:     Diarrhea        HPI:    Chief Complaint  Patient presents with  . Diarrhea    Rebecca [name] is a 60 y.o. y/o female referred for consultation & management  by Dr. Delsa Grana, PA-C.  Patient with history of MS who presents for diarrhea.  Her bowel movements vary from not having a bowel movement for 1 to 2 days, followed by multiple loose bowel movements a day.  With no blood in stool.  She has had history of urinary incontinence and has a catheter.  She has not had anything for incontinence but does need to be near the bathroom to make it in time.  She does report previous history of colonoscopy.  States it is normal and she was told to have a repeat in 10 years and it is not time for another yet.  Procedure report not available.  She did not have the symptoms at that time.  She is also reporting dysphagia, especially to meats even if she cuts them into small bites.  Past Medical History:  Diagnosis Date  . Allergy   . Aortic valve disease    Mild AS / AI - most recent Echo demonstrated tricuspid aortic valve.  . Bacterial endocarditis    History of .  Marland Kitchen Bilateral lower extremity edema    Noncardiac.  Chronic. LE Venous dopplers - negative for DVT.; Echocardiogram January 2016: Normal EF with normal wall motion and valve function. Only grade 1 diastolic dysfunction. EF 60-65%. Mild MR  . Breast cancer ([place]) 12-31-13   Right breast, 12:00, 1.5 cm, T1c,N0 invasive mammary carcinoma, triple negative. --> Rx with Chemo  . Cervical stenosis of spine   . Herpes zoster   . IBS (irritable bowel syndrome)   . Lymphedema    has legs wrapped at [facility]  . Multiple sclerosis ([place]) 2001   Walks from room to room @ home;  but Wheelchair when going out.  Marland Kitchen Neuromuscular disorder ([place])    MS  . PONV (postoperative nausea and vomiting)    Related to Fentanyl  . Seizures ([place])    Takes Keppra  . Syncope and collapse     Past Surgical History:  Procedure Laterality Date  . ANKLE SURGERY     Left  . ANKLE SURGERY    . ANTERIOR CERVICAL DECOMP/DISCECTOMY FUSION  11/17/2011   Procedure: ANTERIOR CERVICAL DECOMPRESSION/DISCECTOMY FUSION 2 LEVELS;  Surgeon: Erline Levine, MD;  Location: [place] [facility]URO ORS;  Service: Neurosurgery;  Laterality: N/A;  Cervical Five-Six Six-Seven Anterior cervical decompression/diskectomy/fusion  . BREAST BIOPSY Right 12-31-13   invasive mammary  . BREAST SURGERY Right 02/03/2014   Right simple mastectomy with sentinel node biopsy.  . CHOLECYSTECTOMY    . COLONOSCOPY  2014  . HYSTEROSCOPY WITH D & C N/A 11/27/2018   Procedure: DILATATION AND CURETTAGE /HYSTEROSCOPY;  Surgeon: Gae Dry, MD;  Location: [facility] ORS;  Service: Gynecology;  Laterality: N/A;  . Lower extremity venous Dopplers  Feb 27, 2013   No LE DVT  . MASTECTOMY Right 2015  . Port a cath insertion Right 01/19/2010  . PORT-A-CATH REMOVAL     right  . PORT-A-CATH REMOVAL Right 09/03/2013  Procedure: REMOVAL PORT-A-CATH;  Surgeon: Conrad Glasgow, MD;  Location: [facility];  Service: Vascular;  Laterality: Right;  . SPINE SURGERY  Cervical steinois Dr. Vertell Limber 2014 I think?  . TONSILLECTOMY    . TRANSTHORACIC ECHOCARDIOGRAM  03/2013; 02/2014   a) Normal LV size and function with EF 60-65%.; Cannot exclude bicuspid aortic valve with mild AS and mild AI.; b) Normal EF with normal wall motion and valve function x Mild MR. G2 DD. EF 60-65%. Tricuspid AoV  . UPPER GI ENDOSCOPY  2014    Prior to Admission medications   Medication Sig Start Date End Date Taking? Authorizing Provider  amantadine (SYMMETREL) 100 MG capsule Take 1 capsule (100 mg total) by mouth 3 (three) times daily. 12/16/19  Yes Penumalli, Earlean Polka, MD  baclofen  (LIORESAL) 10 MG tablet TAKE 1 TO 2 TABLETS BY MOUTH 3 TIMES A DAY 10/30/19  Yes Penumalli, Earlean Polka, MD  Cranberry 1000 MG CAPS Take by mouth.   Yes [provider]  Disposable Gloves (ASSURANCE VINYL EXAM GLOVES) MISC Neurogenic bladder; MS; LON 99 months; for use for personal hygiene 12/26/17  Yes Lada, Satira Anis, MD  fluticasone (FLONASE) 50 MCG/ACT nasal spray SPRAY 2 SPRAYS INTO EACH NOSTRIL EVERY DAY 04/08/19  Yes Delsa Grana, PA-C  Glucosamine-Chondroitin (COSAMIN DS PO) Take 1 tablet by mouth 2 (two) times daily.   Yes [provider]  ibuprofen (ADVIL) 600 MG tablet TAKE 1 TABLET (600 MG TOTAL) BY MOUTH EVERY 8 (EIGHT) HOURS AS NEEDED. 03/30/20  Yes Delsa Grana, PA-C  levETIRAcetam (KEPPRA) 500 MG tablet Take 1 tablet (500 mg total) by mouth 2 (two) times daily. 12/16/19  Yes Penumalli, Earlean Polka, MD  lidocaine-prilocaine (EMLA) cream Apply 1 application topically as needed. 09/20/19  Yes Lloyd Huger, MD  Misc Natural Products (LEG VEIN & CIRCULATION) TABS Take 1 tablet by mouth 2 (two) times daily.    Yes [provider]  Multiple Vitamins-Minerals (HAIR SKIN AND NAILS FORMULA) TABS Take 1 tablet by mouth 2 (two) times daily.    Yes [provider]  Multiple Vitamins-Minerals (MULTIVITAMIN PO) Take 1 tablet by mouth daily.    Yes [provider]  nystatin (MYCOSTATIN/NYSTOP) powder Apply 1 application topically 3 (three) times daily as needed. For rash/raw skin as needed 07/31/19  Yes Delsa Grana, PA-C  oxybutynin (DITROPAN-XL) 10 MG 24 hr tablet Take 1 tablet (10 mg total) by mouth 2 (two) times daily. 09/11/19  Yes Penumalli, Earlean Polka, MD  REBIF REBIDOSE 44 MCG/0.5ML SOAJ Inject  44 mcg Sub-q 3 times weekly Rotate site after each injection 01/28/20  Yes Penumalli, Vikram R, MD  tiZANidine (ZANAFLEX) 4 MG tablet TAKE 1 TABLET (4 MG TOTAL) BY MOUTH 2 (TWO) TIMES DAILY. 09/05/19  Yes Jamse Arn, MD  Turmeric 500 MG CAPS Take 500 mg by mouth  daily.   Yes [provider]    Family History  Problem Relation Age of Onset  . Cancer Father        skin  . Heart disease Father   . Heart attack Father        heart attack in his 38's  . Thyroid disease Sister   . Ovarian cancer Cousin   . Breast cancer Maternal Aunt 60  . Breast cancer Maternal Grandmother 44  . Bladder Cancer Neg Hx   . Kidney cancer Neg Hx      Social History   Tobacco Use  . Smoking status: Never Smoker  .  Smokeless tobacco: Never Used  Vaping Use  . Vaping Use: Never used  Substance Use Topics  . Alcohol use: No  . Drug use: No    Allergies as of 05/05/2020 - Review Complete 05/05/2020  Allergen Reaction Noted  . Fentanyl Nausea And Vomiting and Nausea Only 02/04/2014  . Sulfa antibiotics Hives and Other (See Comments) 06/08/2011    Review of Systems:    All systems reviewed and negative except where noted in HPI.   Physical Exam:  BP (!) 173/97   Pulse 80   Temp 97.8 F (36.6 C) (Oral)   Ht 5\' 9"  (1.753 m)   BMI 31.60 kg/m  No LMP recorded. Patient is postmenopausal. Psych:  Alert and cooperative. Normal mood and affect. General:   Alert,  Well-developed, well-nourished, pleasant and cooperative in NAD Head:  Normocephalic and atraumatic. Eyes:  Sclera clear, no icterus.   Conjunctiva pink. Ears:  Normal auditory acuity. Nose:  No deformity, discharge, or lesions. Mouth:  No deformity or lesions,oropharynx pink & moist. Neck:  Supple; no masses or thyromegaly. Abdomen:  Normal bowel sounds.  No bruits.  Soft, non-tender and non-distended without masses, hepatosplenomegaly or hernias noted.  No guarding or rebound tenderness.    Pulses:  Normal pulses noted. Extremities: 1+ edema present.  No cyanosis. Neurologic:  Alert and oriented x3;  grossly normal neurologically. Skin:  Intact without significant lesions or rashes. No jaundice. Lymph Nodes:  No significant cervical adenopathy. Psych:  Alert and cooperative. Normal  mood and affect.   Labs: CBC    Component Value Date/Time   WBC 4.0 04/14/2020 1235   RBC 4.21 04/14/2020 1235   HGB 12.8 04/14/2020 1235   HGB 12.4 06/27/2016 1515   HCT 38.1 04/14/2020 1235   HCT 37.9 06/27/2016 1515   PLT 126 (L) 04/14/2020 1235   PLT 163 06/27/2016 1515   MCV 90.5 04/14/2020 1235   MCV 92 06/27/2016 1515   MCV 93 06/05/2014 0951   MCH 30.4 04/14/2020 1235   MCHC 33.6 04/14/2020 1235   RDW 12.1 04/14/2020 1235   RDW 13.2 06/27/2016 1515   RDW 18.7 (H) 06/05/2014 0951   LYMPHSABS 964 04/14/2020 1235   LYMPHSABS 1.0 06/27/2016 1515   LYMPHSABS 0.3 (L) 06/05/2014 0951   MONOABS 0.3 11/01/2018 1350   MONOABS 0.3 06/05/2014 0951   EOSABS 108 04/14/2020 1235   EOSABS 0.1 06/27/2016 1515   EOSABS 0.1 06/05/2014 0951   BASOSABS 8 04/14/2020 1235   BASOSABS 0.0 06/27/2016 1515   BASOSABS 0.0 06/05/2014 0951   CMP     Component Value Date/Time   NA 141 04/14/2020 1235   NA 143 07/28/2016 1520   NA 139 06/05/2014 0951   K 3.7 04/14/2020 1235   K 3.8 06/05/2014 0951   CL 105 04/14/2020 1235   CL 107 06/05/2014 0951   CO2 26 04/14/2020 1235   CO2 28 06/05/2014 0951   GLUCOSE 81 04/14/2020 1235   GLUCOSE 130 (H) 06/05/2014 0951   BUN 15 04/14/2020 1235   BUN 12 07/28/2016 1520   BUN 12 06/05/2014 0951   CREATININE 0.54 04/14/2020 1235   CALCIUM 9.1 04/14/2020 1235   CALCIUM 9.4 06/05/2014 0951   PROT 7.4 04/14/2020 1235   PROT 7.0 07/28/2016 1520   PROT 7.0 05/29/2014 0949   ALBUMIN 4.3 01/03/2020 1743   ALBUMIN 4.3 07/28/2016 1520   ALBUMIN 4.1 05/29/2014 0949   AST 22 04/14/2020 1235   AST 24 05/29/2014 0949   ALT  25 04/14/2020 1235   ALT 24 05/29/2014 0949   ALKPHOS 85 01/03/2020 1743   ALKPHOS 58 05/29/2014 0949   BILITOT 0.4 04/14/2020 1235   BILITOT 0.3 07/28/2016 1520   BILITOT 0.6 05/29/2014 0949   GFRNONAA >60 01/03/2020 1743   GFRNONAA 101 01/18/2019 0000   GFRAA 117 01/18/2019 0000    Imaging Studies: No results  found.  Assessment and Plan:   WINDY DUDEK is a 60 y.o. y/o female with history of MS has been referred for diarrhea  Patient does have days without a bowel movement, followed by multiple loose bowel movements the following days.  She may be having overflow diarrhea  Start Metamucil once a day to help bulk stool when she is having bowel movements, and provide fiber on other days to avoid constipation  Obtain fecal pancreatic elastase given her medical history  Upper GI study due to patient's dysphagia.  If it shows an abnormality, proceed with upper endoscopy  Dr Rebecca [name]  Speech recognition software was used to dictate the above note.

## 2020-05-05 NOTE — Patient Instructions (Addendum)
Please do not eat or drink after midnight the night before.    Psyllium granules or powder for solution What is this medicine? PSYLLIUM (SIL i yum) is a bulk-forming fiber laxative. This medicine is used to treat constipation. Increasing fiber in the diet may also help lower cholesterol and promote heart health for some people. This medicine may be used for other purposes; ask your health care provider or pharmacist if you have questions. COMMON BRAND NAME(S): Fiber Therapy, GenFiber, Geri-Mucil, Hydrocil, Konsyl, Metamucil, Metamucil MultiHealth, Mucilin, Natural Fiber Therapy, Reguloid What should I tell my health care provider before I take this medicine? They need to know if you have any of these conditions:  blockage in your bowel  difficulty swallowing  inflammatory bowel disease  phenylketonuria  stomach or intestine problems  sudden change in bowel habits lasting more than 2 weeks  an unusual or allergic reaction to psyllium, other medicines, dyes, or preservatives  pregnant or trying or get pregnant  breast-feeding How should I use this medicine? Mix this medicine into a full glass (240 mL) of water or other cool drink. Take this medicine by mouth. Follow the directions on the package labeling, or take as directed by your health care professional. Take your medicine at regular intervals. Do not take your medicine more often than directed. Talk to your pediatrician regarding the use of this medicine in children. While this drug may be prescribed for children as young as 31 years old for selected conditions, precautions do apply. Overdosage: If you think you have taken too much of this medicine contact a poison control center or emergency room at once. NOTE: This medicine is only for you. Do not share this medicine with others. What if I miss a dose? If you miss a dose, take it as soon as you can. If it is almost time for your next dose, take only that dose. Do not take  double or extra doses. What may interact with this medicine? Interactions are not expected. Take this product at least 2 hours before or after other medicines. This list may not describe all possible interactions. Give your health care provider a list of all the medicines, herbs, non-prescription drugs, or dietary supplements you use. Also tell them if you smoke, drink alcohol, or use illegal drugs. Some items may interact with your medicine. What should I watch for while using this medicine? Check with your doctor or health care professional if your symptoms do not start to get better or if they get worse. Stop using this medicine and contact your doctor or health care professional if you have rectal bleeding or if you have to treat your constipation for more than 1 week. These could be signs of a more serious condition. Drink several glasses of water a day while you are taking this medicine. This will help to relieve constipation and prevent dehydration. What side effects may I notice from receiving this medicine? Side effects that you should report to your doctor or health care professional as soon as possible:  allergic reactions like skin rash, itching or hives, swelling of the face, lips, or tongue  breathing problems  chest pain  nausea, vomiting  rectal bleeding  trouble swallowing Side effects that usually do not require medical attention (report to your doctor or health care professional if they continue or are bothersome):  bloating  gas  stomach cramps This list may not describe all possible side effects. Call your doctor for medical advice about side effects. You  may report side effects to FDA at 1-800-FDA-1088. Where should I keep my medicine? Keep out of the reach of children. Store at room temperature between 15 and 30 degrees C (59 and 86 degrees F). Protect from moisture. Throw away any unused medicine after the expiration date. NOTE: This sheet is a summary. It may  not cover all possible information. If you have questions about this medicine, talk to your doctor, pharmacist, or health care provider.  2021 Elsevier/Gold Standard (2017-06-20 15:41:08)

## 2020-05-06 NOTE — Telephone Encounter (Signed)
Patient checking on the status of message below and would like to know what company [facility] and OT orders where placed with. Patient would like a follow up call today. Patient would also like to speak with the nurse regarding the urine catheter and her being dx with a small bladder. [facility] # [phone]

## 2020-05-06 NOTE — Telephone Encounter (Addendum)
I called [facility] and spoke to [place], she informed me that it is in processing and has been transferred to the branch that will be handling her referral.  I then was transferred to that branch and spoke to [place] who informed me that this patient has been listed as a non-admit due to their staffing capacity.  I will have to send it to another home health agency that accepts her insurance.

## 2020-05-06 NOTE — Telephone Encounter (Signed)
Can you check on this or should patient call?

## 2020-05-11 ENCOUNTER — Encounter: Payer: Medicare Other | Admitting: Physical Medicine & Rehabilitation

## 2020-05-12 ENCOUNTER — Ambulatory Visit: Payer: Medicare Other

## 2020-05-12 NOTE — Progress Notes (Signed)
04/28/2020 8:25 AM   Rebecca [name] 1960/03/17 [id number]  Referring provider: Delsa Grana, PA-C [address] [place],  [place] [address]  Chief Complaint  Patient presents with  . Chronic bacteriuria    HPI: 60-year-old female who is accompanied today by her husband to review urodynamics results.  She is distraught today because she had an issue with her Foley catheter replacement and has become frustrated with her home health care agency.  At the time of urodynamics, they had difficulty deflating her balloon and sounded like she had to have some sort of procedure to have the balloon popped possibly transvaginally.  She also reports that they have been filling her balloon with 30 cc to keep her from leaking..  She feels like one of the catheters placed prior to this episode was defective and ended up falling out.  She is overall distressed.  Urodynamics extensively reviewed today.  This was performed on 04/20/2020.  Maximum bladder capacity only 60 cc.  First sensation at 75 which she described as "pressure".  She never actually had the urge to void.  She did note pressure every time she had an unstable contraction.  First unstable contraction occurred at 52 mL.  Her max detrusor contraction with 5 cm of water.  Leak point pressures were unable to be assessed.  No voluntary contractions appreciated.  During unstable contraction, she did void 19 mL with a max flow of 5 mL/s.  Detrusor pressure peak flow was 5 cm of water.  Right-sided reflux was appreciated.  EMG leads did not stay adherent.   PMH: Past Medical History:  Diagnosis Date  . Allergy   . Aortic valve disease    Mild AS / AI - most recent Echo demonstrated tricuspid aortic valve.  . Bacterial endocarditis    History of .  Marland Kitchen Bilateral lower extremity edema    Noncardiac.  Chronic. LE Venous dopplers - negative for DVT.; Echocardiogram January 2016: Normal EF with normal wall motion and valve function. Only grade  1 diastolic dysfunction. EF 60-65%. Mild MR  . Breast cancer ([place]) 12-31-13   Right breast, 12:00, 1.5 cm, T1c,N0 invasive mammary carcinoma, triple negative. --> Rx with Chemo  . Cervical stenosis of spine   . Herpes zoster   . IBS (irritable bowel syndrome)   . Lymphedema    has legs wrapped at [facility]  . Multiple sclerosis ([place]) 2001   Walks from room to room @ home; but Wheelchair when going out.  Marland Kitchen Neuromuscular disorder ([place])    MS  . PONV (postoperative nausea and vomiting)    Related to Fentanyl  . Seizures ([place])    Takes Keppra  . Syncope and collapse     Surgical History: Past Surgical History:  Procedure Laterality Date  . ANKLE SURGERY     Left  . ANKLE SURGERY    . ANTERIOR CERVICAL DECOMP/DISCECTOMY FUSION  11/17/2011   Procedure: ANTERIOR CERVICAL DECOMPRESSION/DISCECTOMY FUSION 2 LEVELS;  Surgeon: Erline Levine, MD;  Location: [place] [facility];  Service: Neurosurgery;  Laterality: N/A;  Cervical Five-Six Six-Seven Anterior cervical decompression/diskectomy/fusion  . BREAST BIOPSY Right 12-31-13   invasive mammary  . BREAST SURGERY Right 02/03/2014   Right simple mastectomy with sentinel node biopsy.  . CHOLECYSTECTOMY    . COLONOSCOPY  2014  . HYSTEROSCOPY WITH D & C N/A 11/27/2018   Procedure: DILATATION AND CURETTAGE /HYSTEROSCOPY;  Surgeon: Gae Dry, MD;  Location: [facility];  Service: Gynecology;  Laterality: N/A;  .  Lower extremity venous Dopplers  Feb 27, 2013   No LE DVT  . MASTECTOMY Right 2015  . Port a cath insertion Right 01/19/2010  . PORT-A-CATH REMOVAL     right  . PORT-A-CATH REMOVAL Right 09/03/2013   Procedure: REMOVAL PORT-A-CATH;  Surgeon: Conrad Kappa, MD;  Location: [facility];  Service: Vascular;  Laterality: Right;  . SPINE SURGERY  Cervical steinois Dr. Vertell Limber 2014 I think?  . TONSILLECTOMY    . TRANSTHORACIC ECHOCARDIOGRAM  03/2013; 02/2014   a) Normal LV size and function with EF 60-65%.; Cannot exclude bicuspid aortic valve with mild AS  and mild AI.; b) Normal EF with normal wall motion and valve function x Mild MR. G2 DD. EF 60-65%. Tricuspid AoV  . UPPER GI ENDOSCOPY  2014    Home Medications:  Allergies as of 04/28/2020      Reactions   Fentanyl Nausea And Vomiting, Nausea Only   vomiting Was given in PACU x3 each time patient got sick vomiting Was given in PACU x3 each time patient got sick   Sulfa Antibiotics Hives, Other (See Comments)   Light headed, over heated      Medication List       Accurate as of April 28, 2020 11:59 PM. If you have any questions, ask your nurse or doctor.        amantadine 100 MG capsule Commonly known as: SYMMETREL Take 1 capsule (100 mg total) by mouth 3 (three) times daily.   Assurance Vinyl Exam Gloves Misc Neurogenic bladder; MS; LON 99 months; for use for personal hygiene   baclofen 10 MG tablet Commonly known as: LIORESAL TAKE 1 TO 2 TABLETS BY MOUTH 3 TIMES A DAY   COSAMIN DS PO Take 1 tablet by mouth 2 (two) times daily.   Cranberry 1000 MG Caps Take by mouth.   fluticasone 50 MCG/ACT nasal spray Commonly known as: FLONASE SPRAY 2 SPRAYS INTO EACH NOSTRIL EVERY DAY   Hair Skin and Nails Formula Tabs Take 1 tablet by mouth 2 (two) times daily.   ibuprofen 600 MG tablet Commonly known as: ADVIL TAKE 1 TABLET (600 MG TOTAL) BY MOUTH EVERY 8 (EIGHT) HOURS AS NEEDED.   Leg Vein & Circulation Tabs Take 1 tablet by mouth 2 (two) times daily.   levETIRAcetam 500 MG tablet Commonly known as: KEPPRA Take 1 tablet (500 mg total) by mouth 2 (two) times daily.   lidocaine-prilocaine cream Commonly known as: EMLA Apply 1 application topically as needed.   MULTIVITAMIN PO Take 1 tablet by mouth daily.   nystatin powder Commonly known as: MYCOSTATIN/NYSTOP Apply 1 application topically 3 (three) times daily as needed. For rash/raw skin as needed   oxybutynin 10 MG 24 hr tablet Commonly known as: DITROPAN-XL Take 1 tablet (10 mg total) by mouth 2 (two)  times daily.   Rebif Rebidose 44 MCG/0.5ML Soaj Generic drug: Interferon Beta-1a Inject  44 mcg Sub-q 3 times weekly Rotate site after each injection   tiZANidine 4 MG tablet Commonly known as: ZANAFLEX TAKE 1 TABLET (4 MG TOTAL) BY MOUTH 2 (TWO) TIMES DAILY.   Turmeric 500 MG Caps Take 500 mg by mouth daily.       Allergies:  Allergies  Allergen Reactions  . Fentanyl Nausea And Vomiting and Nausea Only    vomiting Was given in PACU x3 each time patient got sick vomiting Was given in PACU x3 each time patient got sick  . Sulfa Antibiotics Hives and Other (See Comments)  Light headed, over heated    Family History: Family History  Problem Relation Age of Onset  . Cancer Father        skin  . Heart disease Father   . Heart attack Father        heart attack in his 4's  . Thyroid disease Sister   . Ovarian cancer Cousin   . Breast cancer Maternal Aunt 60  . Breast cancer Maternal Grandmother 42  . Bladder Cancer Neg Hx   . Kidney cancer Neg Hx     Social History:  reports that she has never smoked. She has never used smokeless tobacco. She reports that she does not drink alcohol and does not use drugs.   Physical Exam: BP (!) 153/87   Pulse 83   Constitutional:  Alert and oriented, No acute distress.  In wheelchair, accompanied by husband today. HEENT: [place] AT, moist mucus membranes.  Trachea midline, no masses. Cardiovascular: No clubbing, cyanosis, or edema. Respiratory: Normal respiratory effort, no increased work of breathing. Skin: No rashes, bruises or suspicious lesions. Psychiatric: Normal mood and affect.  Assessment & Plan:    1. Neurogenic bladder Small capacity 60 cc neurogenic bladder with notable right-sided reflux with relatively low bladder pressures.  We discussed various management options.  At this point, given the very small bladder capacity, she could consider augment with catherizable stoma versus diversion which is outside the scope of  this community practice.  She was offered a referral to Dr. Westly Pam at [facility] for consideration of this and we discussed this at length today.  She is open to this idea.  Alternatively, can consider continuation of Foley catheter which she continues to have issues with versus suprapubic tube which she may have urethral leakage.  We could also consider consideration of Botox in the bladder to help decrease the amount of instability to help with pericatheter leakage.  I would recommend decreasing the balloon size to 10 cc given a relatively small bladder capacity as this may be contributing to discomfort/instability.  She has agreed to let us know which she like to pursue after she discusses this further with Dr. Westly Pam.  Will ensure UDS reports sent with referral  2. Chronic bacteriuria Encouraged/reminded not to treat for "UTI based on urinary smell, etc. as these are not true infection. - Ambulatory referral to Urology   Hollice Espy, MD  [facility] [address] [place],  [address] [phone]  I spent 30 total minutes on the day of the encounter including pre-visit review of the medical record, face-to-face time with the patient, and post visit ordering of labs/imaging/tests.

## 2020-05-13 ENCOUNTER — Telehealth: Payer: Self-pay

## 2020-05-13 NOTE — Telephone Encounter (Signed)
Referral has been sent back to [facility] but I will send to [facility] per patient's request.   Release ID # [id number]

## 2020-05-13 NOTE — Telephone Encounter (Signed)
Copied from [place] [id number]. Topic: General - Other >> May 13, 2020  1:15 PM Pawlus, Brayton Layman A wrote: Reason for CRM: Pt stated she sent messages via MyChart and wanted to know if Kristeen Miss could call [facility] to help her get set up with home care services / nurse aid. Please advise. >> May 13, 2020  1:17 PM Pawlus, Brayton Layman A wrote: Pt stated she also contacted [facility] directly and is waiting to hear back.

## 2020-05-14 ENCOUNTER — Other Ambulatory Visit: Payer: Medicare Other

## 2020-05-28 ENCOUNTER — Ambulatory Visit: Payer: Medicare Other

## 2020-06-02 NOTE — Progress Notes (Signed)
06/03/2020 9:09 PM   Rebecca [name] 08-26-1960 [id number]  Referring provider: Delsa Grana, PA-C [address] [place],  [place] [address]  Chief Complaint  Patient presents with  . Urinary Retention   Urological history: 1. Neurogenic bladder -managed with an indwelling Foley -UDS on 04/20/2020 small capacity 60 cc neurogenic bladder with notable right-sided reflux with relatively low bladder pressures -referred to [facility] for consultation for bladder augmentation   2. Chronic bacteriuria  -secondary to chronic indwelling Foley    HPI: Rebecca [name] is a 60 y.o. female who presents today for a catheter exchange.   Foley catheter successfully changed.  She is wanting a different home health agency to continue her Foley exchanges.  She states the individual from [facility] filled the Foley balloon with saline and it crystallized in the balloon.  A needle had to be placed through her vagina to facilitate Foley removal   Patient denies any modifying or aggravating factors.  Patient denies any gross hematuria or suprapubic/flank pain.  Patient denies any fevers, chills, nausea or vomiting.   PMH: Past Medical History:  Diagnosis Date  . Allergy   . Aortic valve disease    Mild AS / AI - most recent Echo demonstrated tricuspid aortic valve.  . Bacterial endocarditis    History of .  Marland Kitchen Bilateral lower extremity edema    Noncardiac.  Chronic. LE Venous dopplers - negative for DVT.; Echocardiogram January 2016: Normal EF with normal wall motion and valve function. Only grade 1 diastolic dysfunction. EF 60-65%. Mild MR  . Breast cancer ([place]) 12-31-13   Right breast, 12:00, 1.5 cm, T1c,N0 invasive mammary carcinoma, triple negative. --> Rx with Chemo  . Cervical stenosis of spine   . Herpes zoster   . IBS (irritable bowel syndrome)   . Lymphedema    has legs wrapped at [facility]  . Multiple sclerosis ([place]) 2001   Walks from room to room @ home; but Wheelchair when going out.   Marland Kitchen Neuromuscular disorder ([place])    MS  . PONV (postoperative nausea and vomiting)    Related to Fentanyl  . Seizures ([place])    Takes Keppra  . Syncope and collapse     Surgical History: Past Surgical History:  Procedure Laterality Date  . ANKLE SURGERY     Left  . ANKLE SURGERY    . ANTERIOR CERVICAL DECOMP/DISCECTOMY FUSION  11/17/2011   Procedure: ANTERIOR CERVICAL DECOMPRESSION/DISCECTOMY FUSION 2 LEVELS;  Surgeon: Erline Levine, MD;  Location: [place] [facility];  Service: Neurosurgery;  Laterality: N/A;  Cervical Five-Six Six-Seven Anterior cervical decompression/diskectomy/fusion  . BREAST BIOPSY Right 12-31-13   invasive mammary  . BREAST SURGERY Right 02/03/2014   Right simple mastectomy with sentinel node biopsy.  . CHOLECYSTECTOMY    . COLONOSCOPY  2014  . HYSTEROSCOPY WITH D & C N/A 11/27/2018   Procedure: DILATATION AND CURETTAGE /HYSTEROSCOPY;  Surgeon: Gae Dry, MD;  Location: [facility] ORS;  Service: Gynecology;  Laterality: N/A;  . Lower extremity venous Dopplers  Feb 27, 2013   No LE DVT  . MASTECTOMY Right 2015  . Port a cath insertion Right 01/19/2010  . PORT-A-CATH REMOVAL     right  . PORT-A-CATH REMOVAL Right 09/03/2013   Procedure: REMOVAL PORT-A-CATH;  Surgeon: Conrad Rossburg, MD;  Location: [facility];  Service: Vascular;  Laterality: Right;  . SPINE SURGERY  Cervical steinois Dr. Vertell Limber 2014 I think?  . TONSILLECTOMY    . TRANSTHORACIC ECHOCARDIOGRAM  03/2013; 02/2014   a) Normal LV size and function with EF 60-65%.; Cannot exclude bicuspid aortic valve with mild AS and mild AI.; b) Normal EF with normal wall motion and valve function x Mild MR. G2 DD. EF 60-65%. Tricuspid AoV  . UPPER GI ENDOSCOPY  2014    Home Medications:  Allergies as of 06/03/2020      Reactions   Fentanyl Nausea And Vomiting, Nausea Only   vomiting Was given in PACU x3 each time patient got sick vomiting Was given in PACU x3 each time patient got sick   Sulfa Antibiotics Hives, Other  (See Comments)   Light headed, over heated      Medication List       Accurate as of June 03, 2020 11:59 PM. If you have any questions, ask your nurse or doctor.        amantadine 100 MG capsule Commonly known as: SYMMETREL Take 1 capsule (100 mg total) by mouth 3 (three) times daily.   Assurance Vinyl Exam Gloves Misc Neurogenic bladder; MS; LON 99 months; for use for personal hygiene   baclofen 10 MG tablet Commonly known as: LIORESAL TAKE 1 TO 2 TABLETS BY MOUTH 3 TIMES A DAY   COSAMIN DS PO Take 1 tablet by mouth 2 (two) times daily.   Cranberry 1000 MG Caps Take by mouth.   fluticasone 50 MCG/ACT nasal spray Commonly known as: FLONASE SPRAY 2 SPRAYS INTO EACH NOSTRIL EVERY DAY   Hair Skin and Nails Formula Tabs Take 1 tablet by mouth 2 (two) times daily.   ibuprofen 600 MG tablet Commonly known as: ADVIL TAKE 1 TABLET (600 MG TOTAL) BY MOUTH EVERY 8 (EIGHT) HOURS AS NEEDED.   Leg Vein & Circulation Tabs Take 1 tablet by mouth 2 (two) times daily.   levETIRAcetam 500 MG tablet Commonly known as: KEPPRA Take 1 tablet (500 mg total) by mouth 2 (two) times daily.   lidocaine-prilocaine cream Commonly known as: EMLA Apply 1 application topically as needed.   Metamucil 28 % packet Generic drug: psyllium Take 1 packet by mouth daily.   MULTIVITAMIN PO Take 1 tablet by mouth daily.   nystatin powder Commonly known as: MYCOSTATIN/NYSTOP Apply 1 application topically 3 (three) times daily as needed. For rash/raw skin as needed   oxybutynin 10 MG 24 hr tablet Commonly known as: DITROPAN-XL Take 1 tablet (10 mg total) by mouth 2 (two) times daily.   Rebif Rebidose 44 MCG/0.5ML Soaj Generic drug: Interferon Beta-1a Inject  44 mcg Sub-q 3 times weekly Rotate site after each injection   tiZANidine 4 MG tablet Commonly known as: ZANAFLEX TAKE 1 TABLET (4 MG TOTAL) BY MOUTH 2 (TWO) TIMES DAILY.   Turmeric 500 MG Caps Take 500 mg by mouth daily.        Allergies:  Allergies  Allergen Reactions  . Fentanyl Nausea And Vomiting and Nausea Only    vomiting Was given in PACU x3 each time patient got sick vomiting Was given in PACU x3 each time patient got sick  . Sulfa Antibiotics Hives and Other (See Comments)    Light headed, over heated    Family History: Family History  Problem Relation Age of Onset  . Cancer Father        skin  . Heart disease Father   . Heart attack Father        heart attack in his 18's  . Thyroid disease Sister   . Ovarian cancer Cousin   .  Breast cancer Maternal Aunt 60  . Breast cancer Maternal Grandmother 78  . Bladder Cancer Neg Hx   . Kidney cancer Neg Hx     Social History:  reports that she has never smoked. She has never used smokeless tobacco. She reports that she does not drink alcohol and does not use drugs.  ROS: For pertinent review of systems please refer to history of present illness  Physical Exam: Constitutional:  Well nourished. Alert and oriented, No acute distress. HEENT: [place] AT, mask in place.  Trachea midline Cardiovascular: No clubbing, cyanosis, or edema. Respiratory: Normal respiratory effort, no increased work of breathing. Neurologic: Grossly intact, no focal deficits.  In a wheelchair.   Psychiatric: Normal mood and affect.   Laboratory Data: Lab Results  Component Value Date   WBC 4.0 04/14/2020   HGB 12.8 04/14/2020   HCT 38.1 04/14/2020   MCV 90.5 04/14/2020   PLT 126 (L) 04/14/2020    Lab Results  Component Value Date   CREATININE 0.54 04/14/2020    Lab Results  Component Value Date   HGBA1C 5.1 09/27/2019    Lab Results  Component Value Date   AST 22 04/14/2020   Lab Results  Component Value Date   ALT 25 04/14/2020  I have reviewed the labs.  Pertinent Imaging: No recent imaging  Assessment & Plan:    1. Urinary retention due to neurogenic bladder -Managed with indwelling Foley changed every 30 days -would like a referral to a  different home health agency -referral placed with [facility]   Return in about 1 month (around 07/03/2020) for Foley exchange .  These notes generated with voice recognition software. I apologize for typographical errors.  Zara Council, PA-C  [facility] [address]  [place], [place] [address] [phone]  I spent 15 minutes on the day of the encounter to include pre-visit record review, face-to-face time with the patient, and post-visit ordering of tests.

## 2020-06-03 ENCOUNTER — Ambulatory Visit (INDEPENDENT_AMBULATORY_CARE_PROVIDER_SITE_OTHER): Payer: Medicare Other | Admitting: Urology

## 2020-06-03 ENCOUNTER — Other Ambulatory Visit: Payer: Self-pay

## 2020-06-03 DIAGNOSIS — R8271 Bacteriuria: Secondary | ICD-10-CM

## 2020-06-03 DIAGNOSIS — N319 Neuromuscular dysfunction of bladder, unspecified: Secondary | ICD-10-CM

## 2020-06-03 NOTE — Progress Notes (Signed)
Cath Change/ Replacement  Patient is present today for a catheter change due to urinary retention.  24ml of water was removed from the balloon, a 16FR foley cath was removed with out difficulty.  Patient was cleaned and prepped in a sterile fashion with betadine. A 16 FR foley cath was replaced into the bladder no complications were noted Urine return was noted 74ml and urine was yellow in color. The balloon was filled with 14ml of sterile water. A leg bag was attached for drainage.   Patient was given proper instruction on catheter care.    Performed by: Elberta Leatherwood, CMA  Follow up: 1 month

## 2020-06-12 ENCOUNTER — Other Ambulatory Visit: Payer: Self-pay | Admitting: Gastroenterology

## 2020-06-12 ENCOUNTER — Ambulatory Visit
Admission: RE | Admit: 2020-06-12 | Discharge: 2020-06-12 | Disposition: A | Discharge status: Home / Self Care | Payer: Medicare Other | Source: Ambulatory Visit | Attending: Gastroenterology | Admitting: Gastroenterology

## 2020-06-12 ENCOUNTER — Other Ambulatory Visit: Payer: Self-pay

## 2020-06-12 DIAGNOSIS — R1319 Other dysphagia: Secondary | ICD-10-CM | POA: Insufficient documentation

## 2020-06-12 DIAGNOSIS — R197 Diarrhea, unspecified: Secondary | ICD-10-CM | POA: Diagnosis not present

## 2020-06-15 ENCOUNTER — Inpatient Hospital Stay: Payer: Medicare Other

## 2020-06-15 ENCOUNTER — Inpatient Hospital Stay: Payer: Medicare Other | Admitting: Oncology

## 2020-06-15 LAB — PANCREATIC ELASTASE, FECAL: Pancreatic Elastase, Fecal: 100 ug Elast./g — ABNORMAL LOW (ref >200)

## 2020-06-16 ENCOUNTER — Other Ambulatory Visit: Payer: Self-pay | Admitting: Gastroenterology

## 2020-06-16 MED ORDER — PANCRELIPASE (LIP-PROT-AMYL) 36000-114000 UNITS PO CPEP: ORAL_CAPSULE | ORAL | 0 refills | Status: DC

## 2020-06-18 ENCOUNTER — Telehealth: Payer: Self-pay | Admitting: Family Medicine

## 2020-06-18 NOTE — Telephone Encounter (Signed)
Please advise 

## 2020-06-18 NOTE — Telephone Encounter (Signed)
Patient left message stating she spoke to [facility] about getting Home health to come out to change catheters and do physical therapy. Per Larene Beach she is working on [employer] and getting a new company to go to the home. Spoke to patient and she voiced understanding. We will reach out to patient when we have answers.

## 2020-06-18 NOTE — Telephone Encounter (Signed)
Pt is following up on request for a new home health service with a nurse along with PT. Pt states she had a situation with [facility], and no longer wants that agency. Pt needs help wit her catheter. Pt needs nurse to help with catheter. Please advise

## 2020-06-18 NOTE — Telephone Encounter (Signed)
Contacted patient she needs to reach out to urologist about catheter care and home health.  Rebecca [name] will not sign orders for that since she does not see her for that.

## 2020-06-22 ENCOUNTER — Encounter: Payer: Self-pay | Admitting: Urology

## 2020-06-22 ENCOUNTER — Encounter: Payer: Medicare Other | Admitting: Physical Medicine & Rehabilitation

## 2020-06-23 NOTE — Telephone Encounter (Addendum)
Patient would like to know the status of the PT orders. Patient is awaiting to hear back from her urologist regarding the home health/nursig orders to assist with catheter care. Patient would like a follow up call.  *Patient states she does not want to go through [facility] home care due to a bad experience. [facility] preferable.

## 2020-06-24 ENCOUNTER — Encounter: Payer: Self-pay | Admitting: Gastroenterology

## 2020-06-24 ENCOUNTER — Telehealth (INDEPENDENT_AMBULATORY_CARE_PROVIDER_SITE_OTHER): Payer: Medicare Other | Admitting: Gastroenterology

## 2020-06-24 ENCOUNTER — Other Ambulatory Visit: Payer: Self-pay

## 2020-06-24 DIAGNOSIS — R1310 Dysphagia, unspecified: Secondary | ICD-10-CM

## 2020-06-24 DIAGNOSIS — R197 Diarrhea, unspecified: Secondary | ICD-10-CM | POA: Diagnosis not present

## 2020-06-24 DIAGNOSIS — K909 Intestinal malabsorption, unspecified: Secondary | ICD-10-CM | POA: Diagnosis not present

## 2020-06-24 DIAGNOSIS — R1319 Other dysphagia: Secondary | ICD-10-CM

## 2020-06-24 NOTE — Progress Notes (Signed)
Rebecca Antigua, MD [address]  [place]  [place], [place] [address]  Main: [phone]  Fax: [phone]   Primary Care Physician: Delsa Grana, PA-C  Virtual Visit via Telephone Note  I connected with patient on 06/24/20 at  2:15 PM EDT by telephone and verified that I am speaking with the correct person using two identifiers.   I discussed the limitations, risks, security and privacy concerns of performing an evaluation and management service by telephone and the availability of in person appointments. I also discussed with the patient that there may be a patient responsible charge related to this service. The patient expressed understanding and agreed to proceed.  Location of Patient: Home Location of Provider: Home Persons involved: Patient and provider only during the visit (nursing staff and front desk staff was involved in communicating with the patient prior to the appointment, reviewing medications and checking them in)  This was scheduled to be a MyChart video visit.  Both the patient and I connected via the video software and it showed that she was connected as it was green in color.  However, initially she could see and hear me but I was never able to see or hear her.  Even when she exited the system and reconnected, neither 1 of [place] could see or hear each other and therefore this had to be converted to a telephone visit after multiple attempts   History of Present Illness: Chief Complaint  Patient presents with  . Follow-up    6 week follow up      HPI: Rebecca [name] is a 60 y.o. female here for follow-up of diarrhea and dysphagia.  Patient has been taking Metamucil daily which has helped her diarrhea.  Her fecal pancreatic elastase is consistent with severe pancreatic insufficiency and Creon was prescribed.  Patient has not started taking this yet.  Patient reports having a severe episode of dysphagia yesterday night when she was eating chicken and it got  stuck in her mid chest and she had to stand straight up and it eventually passed and it scared her.  Her upper GI study did show a hiatal hernia and severe reflux.  Current Outpatient Medications  Medication Sig Dispense Refill  . amantadine (SYMMETREL) 100 MG capsule Take 1 capsule (100 mg total) by mouth 3 (three) times daily. 270 capsule 4  . baclofen (LIORESAL) 10 MG tablet TAKE 1 TO 2 TABLETS BY MOUTH 3 TIMES A DAY 540 tablet 2  . Cranberry 1000 MG CAPS Take by mouth.    . Disposable Gloves (ASSURANCE VINYL EXAM GLOVES) MISC Neurogenic bladder; MS; LON 99 months; for use for personal hygiene 50 each 11  . fluticasone (FLONASE) 50 MCG/ACT nasal spray SPRAY 2 SPRAYS INTO EACH NOSTRIL EVERY DAY 48 mL 3  . Glucosamine-Chondroitin (COSAMIN DS PO) Take 1 tablet by mouth 2 (two) times daily.    Marland Kitchen ibuprofen (ADVIL) 600 MG tablet TAKE 1 TABLET (600 MG TOTAL) BY MOUTH EVERY 8 (EIGHT) HOURS AS NEEDED. 90 tablet 3  . levETIRAcetam (KEPPRA) 500 MG tablet Take 1 tablet (500 mg total) by mouth 2 (two) times daily. 180 tablet 4  . lidocaine-prilocaine (EMLA) cream Apply 1 application topically as needed. 30 g 0  . lipase/protease/amylase (CREON) 36000 UNITS CPEP capsule Take 1 capsule (36,000 Units total) by mouth with breakfast, with lunch, and with evening meal AND 1 capsule (36,000 Units total) with snacks. 540 capsule 0  . Misc Natural Products (LEG VEIN & CIRCULATION)  TABS Take 1 tablet by mouth 2 (two) times daily.     . Multiple Vitamins-Minerals (HAIR SKIN AND NAILS FORMULA) TABS Take 1 tablet by mouth 2 (two) times daily.     . Multiple Vitamins-Minerals (MULTIVITAMIN PO) Take 1 tablet by mouth daily.     Marland Kitchen nystatin (MYCOSTATIN/NYSTOP) powder Apply 1 application topically 3 (three) times daily as needed. For rash/raw skin as needed 15 g 2  . oxybutynin (DITROPAN-XL) 10 MG 24 hr tablet Take 1 tablet (10 mg total) by mouth 2 (two) times daily. 180 tablet 4  . REBIF REBIDOSE 44 MCG/0.5ML SOAJ Inject   44 mcg Sub-q 3 times weekly Rotate site after each injection 0.5 mL 11  . tiZANidine (ZANAFLEX) 4 MG tablet TAKE 1 TABLET (4 MG TOTAL) BY MOUTH 2 (TWO) TIMES DAILY. 180 tablet 1  . Turmeric 500 MG CAPS Take 500 mg by mouth daily.     No current facility-administered medications for this visit.    Allergies as of 06/24/2020 - Review Complete 06/24/2020  Allergen Reaction Noted  . Fentanyl Nausea And Vomiting and Nausea Only 02/04/2014  . Sulfa antibiotics Hives and Other (See Comments) 06/08/2011    Review of Systems:    All systems reviewed and negative except where noted in HPI.   Observations/Objective:  Labs: CMP     Component Value Date/Time   NA 141 04/14/2020 1235   NA 143 07/28/2016 1520   NA 139 06/05/2014 0951   K 3.7 04/14/2020 1235   K 3.8 06/05/2014 0951   CL 105 04/14/2020 1235   CL 107 06/05/2014 0951   CO2 26 04/14/2020 1235   CO2 28 06/05/2014 0951   GLUCOSE 81 04/14/2020 1235   GLUCOSE 130 (H) 06/05/2014 0951   BUN 15 04/14/2020 1235   BUN 12 07/28/2016 1520   BUN 12 06/05/2014 0951   CREATININE 0.54 04/14/2020 1235   CALCIUM 9.1 04/14/2020 1235   CALCIUM 9.4 06/05/2014 0951   PROT 7.4 04/14/2020 1235   PROT 7.0 07/28/2016 1520   PROT 7.0 05/29/2014 0949   ALBUMIN 4.3 01/03/2020 1743   ALBUMIN 4.3 07/28/2016 1520   ALBUMIN 4.1 05/29/2014 0949   AST 22 04/14/2020 1235   AST 24 05/29/2014 0949   ALT 25 04/14/2020 1235   ALT 24 05/29/2014 0949   ALKPHOS 85 01/03/2020 1743   ALKPHOS 58 05/29/2014 0949   BILITOT 0.4 04/14/2020 1235   BILITOT 0.3 07/28/2016 1520   BILITOT 0.6 05/29/2014 0949   GFRNONAA >60 01/03/2020 1743   GFRNONAA 101 01/18/2019 0000   GFRAA 117 01/18/2019 0000   Lab Results  Component Value Date   WBC 4.0 04/14/2020   HGB 12.8 04/14/2020   HCT 38.1 04/14/2020   MCV 90.5 04/14/2020   PLT 126 (L) 04/14/2020    Imaging Studies: DG ESOPHAGUS W SINGLE CM (SOL OR THIN BA)  Result Date: 06/12/2020 CLINICAL DATA:  Dysphagia  EXAM: ESOPHOGRAM/BARIUM SWALLOW TECHNIQUE: Single contrast examination was performed using thin barium or water soluble. FLUOROSCOPY TIME:  Fluoroscopy Time:  36 seconds Radiation Exposure Index (if provided by the fluoroscopic device): 6.4 mGy Number of Acquired Spot Images: 0 COMPARISON:  None. FINDINGS: Examination was performed in the semi upright position. Patient was unable to stand. Normal pharyngeal anatomy and motility. Contrast flowed freely through the esophagus without evidence of a stricture or mass. Normal esophageal mucosa without evidence of irregularity or ulceration. Tertiary contractions of the distal third of the esophagus as can be seen with esophageal  spasm. Severe gastroesophageal reflux. Moderate size hiatal hernia. At the end of the examination a 13 mm barium tablet was administered which transited through the esophagus and esophagogastric junction without delay. IMPRESSION: Tertiary contractions of the distal third of the esophagus as can be seen with esophageal spasm. Severe gastroesophageal reflux. Moderate size hiatal hernia. Electronically Signed   By: Kathreen Devoid   On: 06/12/2020 13:52    Assessment and Plan:   Rebecca [name] is a 60 y.o. y/o female here for follow-up of diarrhea and dysphagia  Assessment and Plan: Her dysphagia is not improving and she had a severe episode yesterday night.  Therefore, EGD indicated for further evaluation of her symptoms  She has done well with sedation with her prior colonoscopy in 2014.  Please see colonoscopy report in provation that recommends repeat colonoscopy in 10 years  Patient will start taking her Creon after discussing about it further with me today    Follow Up Instructions:    I discussed the assessment and treatment plan with the patient. The patient was provided an opportunity to ask questions and all were answered. The patient agreed with the plan and demonstrated an understanding of the instructions.   The  patient was advised to call back or seek an in-person evaluation if the symptoms worsen or if the condition fails to improve as anticipated.  I provided 22 minutes of non-face-to-face time during this encounter. Additional time was spent in reviewing patient's chart, placing orders etc. An extensive amount of time was spent past the appointment time to try to make the video software work and due to multiple attempts, the time spent on this visit was extended   Virgel Manifold, MD  Speech recognition software was used to dictate this note.

## 2020-06-26 ENCOUNTER — Other Ambulatory Visit: Payer: Self-pay

## 2020-06-26 ENCOUNTER — Inpatient Hospital Stay: Payer: Medicare Other

## 2020-06-26 ENCOUNTER — Inpatient Hospital Stay: Payer: Medicare Other | Attending: Oncology | Admitting: Oncology

## 2020-06-26 ENCOUNTER — Encounter: Payer: Self-pay | Admitting: Oncology

## 2020-06-26 VITALS — BP 166/76 | HR 67 | Temp 96.1°F | Ht 69.0 in

## 2020-06-26 DIAGNOSIS — Z79899 Other long term (current) drug therapy: Secondary | ICD-10-CM | POA: Insufficient documentation

## 2020-06-26 DIAGNOSIS — Z171 Estrogen receptor negative status [ER-]: Secondary | ICD-10-CM | POA: Insufficient documentation

## 2020-06-26 DIAGNOSIS — Z1231 Encounter for screening mammogram for malignant neoplasm of breast: Secondary | ICD-10-CM | POA: Diagnosis not present

## 2020-06-26 DIAGNOSIS — R600 Localized edema: Secondary | ICD-10-CM | POA: Diagnosis not present

## 2020-06-26 DIAGNOSIS — I890 Lymphedema, not elsewhere classified: Secondary | ICD-10-CM | POA: Diagnosis not present

## 2020-06-26 DIAGNOSIS — G35 Multiple sclerosis: Secondary | ICD-10-CM | POA: Insufficient documentation

## 2020-06-26 DIAGNOSIS — C50911 Malignant neoplasm of unspecified site of right female breast: Secondary | ICD-10-CM | POA: Diagnosis not present

## 2020-06-26 DIAGNOSIS — R1310 Dysphagia, unspecified: Secondary | ICD-10-CM | POA: Diagnosis not present

## 2020-06-26 DIAGNOSIS — Z95828 Presence of other vascular implants and grafts: Secondary | ICD-10-CM

## 2020-06-26 MED ORDER — HEPARIN SOD (PORK) LOCK FLUSH 100 UNIT/ML IV SOLN
500.0000 [IU] | Freq: Once | INTRAVENOUS | Status: AC
  Administered 2020-06-26: 500 [IU] via INTRAVENOUS
  Filled 2020-06-26: qty 5

## 2020-06-26 MED ORDER — SODIUM CHLORIDE 0.9% FLUSH
10.0000 mL | INTRAVENOUS | Status: DC | PRN
  Administered 2020-06-26: 10 mL via INTRAVENOUS
  Filled 2020-06-26: qty 10

## 2020-06-26 NOTE — Progress Notes (Signed)
[facility]  [phone] [phone]  ID: Rebecca [name] OB: September 08, 1960  MR#: [id number]  DHW#:[id number]  Patient Care Team: Delsa Grana, PA-C as PCP - General (Family Medicine) Bobetta Lime, MD as Referring Physician Byrnett, Forest Gleason, MD (General Surgery) Penni Bombard, MD as Consulting Physician (Neurology) Lloyd Huger, MD as Consulting Physician (Oncology) Leonie Man, MD as Consulting Physician (Cardiology) Minna Merritts, MD as Consulting Physician (Cardiology) Laneta Simmers as Physician Assistant (Urology) Cathi Roan, [facility] (Pharmacist)  CHIEF COMPLAINT: 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 BRCA negative.  INTERVAL HISTORY: She was last seen in clinic on 06/06/2019.   Rebecca [name] returns to the clinic today for her 1 year port-flush and MD assessment.   Rebecca [name] reports she is doing well overall. She did not have a Mammogram in 2021, and therefore needs a mammogram as soon as possible.   She reports having trouble with home care (including OT, PT; she is active with RN/NT). She has not had home care in over a month secondary to issues with staffing issues, and issues with caregivers. PCP and Neurology have placed orders, and patient reports she is awaiting insurance approval.    She currently feels well and is at her baseline.  No new neurologic complaints/seizures.   Her lower extremity edema is unchanged.  Reports using a lymphedema machine at home.   She reports new onset dysphagia. She is seeing GI. She is scheduled for esophageal stretching on June 9th.   Review of Systems  Constitutional: Negative for fever, malaise/fatigue and weight loss.  HENT: Negative for congestion and hearing loss.        Trouble swallowing  Eyes: Negative for blurred vision and double vision.  Respiratory: Negative for cough.   Cardiovascular: Negative for chest pain and palpitations.   Gastrointestinal: Negative for abdominal pain, constipation, diarrhea, nausea and vomiting.  Genitourinary: Negative for frequency and urgency.  Skin: Negative for rash.  Neurological: Positive for focal weakness. Negative for dizziness, tingling and headaches.  Endo/Heme/Allergies: Does not bruise/bleed easily.  Psychiatric/Behavioral: Negative for depression. The patient is not nervous/anxious and does not have insomnia.     PAST MEDICAL HISTORY: Past Medical History:  Diagnosis Date  . Allergy   . Aortic valve disease    Mild AS / AI - most recent Echo demonstrated tricuspid aortic valve.  . Bacterial endocarditis    History of .  Marland Kitchen Bilateral lower extremity edema    Noncardiac.  Chronic. LE Venous dopplers - negative for DVT.; Echocardiogram January 2016: Normal EF with normal wall motion and valve function. Only grade 1 diastolic dysfunction. EF 60-65%. Mild MR  . Breast cancer ([place]) 12-31-13   Right breast, 12:00, 1.5 cm, T1c,N0 invasive mammary carcinoma, triple negative. --> Rx with Chemo  . Cervical stenosis of spine   . Herpes zoster   . IBS (irritable bowel syndrome)   . Lymphedema    [facility] legs [facility]  . Multiple sclerosis ([place]) 2001   Walks from room to room @ home; but Wheelchair when going out.  Marland Kitchen Neuromuscular disorder ([place])    MS  . PONV (postoperative nausea and vomiting)    Related to Fentanyl  . Seizures ([place])    Takes Keppra  . Syncope and collapse     PAST SURGICAL HISTORY: Past Surgical History:  Procedure Laterality Date  . ANKLE SURGERY     Left  . ANKLE SURGERY    .  ANTERIOR CERVICAL DECOMP/DISCECTOMY FUSION  11/17/2011   Procedure: ANTERIOR CERVICAL DECOMPRESSION/DISCECTOMY FUSION 2 LEVELS;  Surgeon: Erline Levine, MD;  Location: [facility];  Service: Neurosurgery;  Laterality: N/A;  Cervical Five-Six Six-Seven Anterior cervical decompression/diskectomy/fusion  . BREAST BIOPSY Right 12-31-13   invasive mammary  . BREAST SURGERY Right  02/03/2014   Right simple mastectomy with sentinel node biopsy.  . CHOLECYSTECTOMY    . COLONOSCOPY  2014  . HYSTEROSCOPY WITH D & C N/A 11/27/2018   Procedure: DILATATION AND CURETTAGE /HYSTEROSCOPY;  Surgeon: Gae Dry, MD;  Location: [facility] ORS;  Service: Gynecology;  Laterality: N/A;  . Lower extremity venous Dopplers  Feb 27, 2013   No LE DVT  . MASTECTOMY Right 2015  . Port a cath insertion Right 01/19/2010  . PORT-A-CATH REMOVAL     right  . PORT-A-CATH REMOVAL Right 09/03/2013   Procedure: REMOVAL PORT-A-CATH;  Surgeon: Conrad Kingstown, MD;  Location: [facility];  Service: Vascular;  Laterality: Right;  . SPINE SURGERY  Cervical steinois Dr. Vertell Limber 2014 I think?  . TONSILLECTOMY    . TRANSTHORACIC ECHOCARDIOGRAM  03/2013; 02/2014   a) Normal LV size and function with EF 60-65%.; Cannot exclude bicuspid aortic valve with mild AS and mild AI.; b) Normal EF with normal wall motion and valve function x Mild MR. G2 DD. EF 60-65%. Tricuspid AoV  . UPPER GI ENDOSCOPY  2014    FAMILY HISTORY Family History  Problem Relation Age of Onset  . Cancer Father        skin  . Heart disease Father   . Heart attack Father        heart attack in his 49's  . Thyroid disease Sister   . Ovarian cancer Cousin   . Breast cancer Maternal Aunt 60  . Breast cancer Maternal Grandmother 30  . Bladder Cancer Neg Hx   . Kidney cancer Neg Hx        ADVANCED DIRECTIVES:    HEALTH MAINTENANCE: Social History   Tobacco Use  . Smoking status: Never Smoker  . Smokeless tobacco: Never Used  Vaping Use  . Vaping Use: Never used  Substance Use Topics  . Alcohol use: No  . Drug use: No     Allergies  Allergen Reactions  . Fentanyl Nausea And Vomiting and Nausea Only    vomiting Was given in PACU x3 each time patient got sick vomiting Was given in PACU x3 each time patient got sick  . Sulfa Antibiotics Hives and Other (See Comments)    Light headed, over heated    Current Outpatient  Medications  Medication Sig Dispense Refill  . amantadine (SYMMETREL) 100 MG capsule Take 1 capsule (100 mg total) by mouth 3 (three) times daily. 270 capsule 4  . baclofen (LIORESAL) 10 MG tablet TAKE 1 TO 2 TABLETS BY MOUTH 3 TIMES A DAY 540 tablet 2  . Cranberry 1000 MG CAPS Take by mouth.    . Disposable Gloves (ASSURANCE VINYL EXAM GLOVES) MISC Neurogenic bladder; MS; LON 99 months; for use for personal hygiene 50 each 11  . fluticasone (FLONASE) 50 MCG/ACT nasal spray SPRAY 2 SPRAYS INTO EACH NOSTRIL EVERY DAY 48 mL 3  . Glucosamine-Chondroitin (COSAMIN DS PO) Take 1 tablet by mouth 2 (two) times daily.    Marland Kitchen ibuprofen (ADVIL) 600 MG tablet TAKE 1 TABLET (600 MG TOTAL) BY MOUTH EVERY 8 (EIGHT) HOURS AS NEEDED. 90 tablet 3  . levETIRAcetam (KEPPRA) 500 MG tablet Take 1  tablet (500 mg total) by mouth 2 (two) times daily. 180 tablet 4  . lidocaine-prilocaine (EMLA) cream Apply 1 application topically as needed. 30 g 0  . lipase/protease/amylase (CREON) 36000 UNITS CPEP capsule Take 1 capsule (36,000 Units total) by mouth with breakfast, with lunch, and with evening meal AND 1 capsule (36,000 Units total) with snacks. 540 capsule 0  . Misc Natural Products (LEG VEIN & CIRCULATION) TABS Take 1 tablet by mouth 2 (two) times daily.     . Multiple Vitamins-Minerals (HAIR SKIN AND NAILS FORMULA) TABS Take 1 tablet by mouth 2 (two) times daily.     . Multiple Vitamins-Minerals (MULTIVITAMIN PO) Take 1 tablet by mouth daily.     Marland Kitchen nystatin (MYCOSTATIN/NYSTOP) powder Apply 1 application topically 3 (three) times daily as needed. For rash/raw skin as needed 15 g 2  . oxybutynin (DITROPAN-XL) 10 MG 24 hr tablet Take 1 tablet (10 mg total) by mouth 2 (two) times daily. 180 tablet 4  . REBIF REBIDOSE 44 MCG/0.5ML SOAJ Inject  44 mcg Sub-q 3 times weekly Rotate site after each injection 0.5 mL 11  . tiZANidine (ZANAFLEX) 4 MG tablet TAKE 1 TABLET (4 MG TOTAL) BY MOUTH 2 (TWO) TIMES DAILY. 180 tablet 1  .  Turmeric 500 MG CAPS Take 500 mg by mouth daily.     No current facility-administered medications for this visit.    OBJECTIVE: There were no vitals filed for this visit.   There is no height or weight on file to calculate BMI.    ECOG FS:2 - Symptomatic, <50% confined to bed  Physical Exam Vitals and nursing note reviewed. Chaperone present: not examined; foley catheter in place.  HENT:     Head: Normocephalic and atraumatic.     Nose: No congestion.     Mouth/Throat:     Mouth: Mucous membranes are moist.     Pharynx: Oropharynx is clear.  Eyes:     General:        Right eye: No discharge.        Left eye: No discharge.     Extraocular Movements: Extraocular movements intact.     Pupils: Pupils are equal, round, and reactive to light.  Cardiovascular:     Rate and Rhythm: Normal rate and regular rhythm.     Pulses: Normal pulses.     Heart sounds: No murmur heard.   Pulmonary:     Effort: Pulmonary effort is normal.  Abdominal:     General: Bowel sounds are normal. There is no distension.     Tenderness: There is no abdominal tenderness. There is no guarding.  Musculoskeletal:        General: No swelling or deformity.     Right lower leg: 4+ Edema present.     Left lower leg: 4+ Edema present.  Skin:    General: Skin is warm and dry.     Capillary Refill: Capillary refill takes 2 to 3 seconds.  Neurological:     Mental Status: She is alert and oriented to person, place, and time.  Psychiatric:        Mood and Affect: Mood normal.        Thought Content: Thought content normal.       LAB RESULTS:  Lab Results  Component Value Date   NA 141 04/14/2020   K 3.7 04/14/2020   CL 105 04/14/2020   CO2 26 04/14/2020   GLUCOSE 81 04/14/2020   BUN 15 04/14/2020   CREATININE  0.54 04/14/2020   CALCIUM 9.1 04/14/2020   PROT 7.4 04/14/2020   ALBUMIN 4.3 01/03/2020   AST 22 04/14/2020   ALT 25 04/14/2020   ALKPHOS 85 01/03/2020   BILITOT 0.4 04/14/2020   GFRNONAA  >60 01/03/2020   GFRAA 117 01/18/2019    Lab Results  Component Value Date   WBC 4.0 04/14/2020   NEUTROABS 2,492 04/14/2020   HGB 12.8 04/14/2020   HCT 38.1 04/14/2020   MCV 90.5 04/14/2020   PLT 126 (L) 04/14/2020      STUDIES: DG ESOPHAGUS W SINGLE CM (SOL OR THIN BA)  Result Date: 06/12/2020 CLINICAL DATA:  Dysphagia EXAM: ESOPHOGRAM/BARIUM SWALLOW TECHNIQUE: Single contrast examination was performed using thin barium or water soluble. FLUOROSCOPY TIME:  Fluoroscopy Time:  36 seconds Radiation Exposure Index (if provided by the fluoroscopic device): 6.4 mGy Number of Acquired Spot Images: 0 COMPARISON:  None. FINDINGS: Examination was performed in the semi upright position. Patient was unable to stand. Normal pharyngeal anatomy and motility. Contrast flowed freely through the esophagus without evidence of a stricture or mass. Normal esophageal mucosa without evidence of irregularity or ulceration. Tertiary contractions of the distal third of the esophagus as can be seen with esophageal spasm. Severe gastroesophageal reflux. Moderate size hiatal hernia. At the end of the examination a 13 mm barium tablet was administered which transited through the esophagus and esophagogastric junction without delay. IMPRESSION: Tertiary contractions of the distal third of the esophagus as can be seen with esophageal spasm. Severe gastroesophageal reflux. Moderate size hiatal hernia. 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 BRCA negative.  PLAN:   1. Triple negative right breast cancer: No evidence of disease. Given her difficulties with Taxol and multiple sclerosis, treatment was discontinued after a total of 6 of 12 cycles of Taxol. Her last chemotherapy was July 17, 2014.  Because patient had a full mastectomy, she did not require adjuvant XRT. Given the fact that she is a triple negative cancer, she does  not require an aromatase inhibitor.  Patient's most recent left screening mammogram on September 06, 2018 was reported as BI-RADS 1.  She did not complete a mammogram in July 2021 as scheduled. Mammogram scheduled for June 2022.  Patient is now greater than 6 years out from completing her treatment and can continue yearly visits.  2. Multiple sclerosis: Continue evaluation and current treatment per neurology. She is currently taking Rebif/Rebidose three times per week. Her primary neurologist is Dr. Andrey Spearman.  phone [phone], fax [phone].  3. Lymphedema: Chronic and unchanged.  Continue treatment and wraps as prescribed.  4. Peripheral neuropathy: Patient does not complain of this today.  5. Pain: Patient is no longer taking narcotics.  6.  Thrombocytopenia: Resolved.  7.  Foley catheter: Seeing Urology on June 1st , 2022, in [place] for possible bladder dilation. She will require lifelong management with a foley catheter or suprapubic catheter per Urology.   Disposition: Follow up in 1 year-port-flush.  Schedule mammogram in the next month.  Schedule a virtual visit to discuss Mammogram results.   The patient's diagnosis, an outline of the further diagnostic and laboratory studies which will be required, the recommendation for surgery, and alternatives were discussed with her and her accompanying family members.  All questions were answered to their satisfaction.  I personally had a face to face interaction and evaluated the patient jointly with the  NP Student, Mrs. Benedetto Goad.  I have reviewed her history and available records and have performed the key portions of the physical exam including general, HEENT, abdominal exam, pelvic exam with my findings confirming those documented above by the APP student.  I have discussed the case with the APP student and the patient.  I agree with the above documentation, assessment and plan which was fully formulated by me.   Counseling was completed by me.    Benedetto Goad, Student FNP  Greater than 50% was spent in counseling and coordination of care with this patient including but not limited to discussion of the relevant topics above (See A&P) including, but not limited to diagnosis and management of acute and chronic medical conditions.   Faythe Casa, NP 06/26/2020 2:46 PM

## 2020-06-30 ENCOUNTER — Other Ambulatory Visit: Payer: Self-pay | Admitting: Physician Assistant

## 2020-07-01 NOTE — Progress Notes (Signed)
Cath Change/ Replacement  Patient is present today for a catheter change due to urinary retention.  8 ml of water was removed from the balloon, a 16 FR foley cath was removed with out difficulty.  Patient was cleaned and prepped in a sterile fashion with betadine. A 16 FR foley cath was replaced into the bladder no complications were noted Urine return was noted 10 ml and urine was yellow clear in color. The balloon was filled with 62ml of sterile water. A leg bag was attached for drainage.  A night bag was also given to the patient and patient was given instruction on how to change from one bag to another. Patient was given proper instruction on catheter care.    Performed by: Nori Riis, PA-C and Kalman Jewels, CMA   Follow up: One month for Foley exchange

## 2020-07-02 ENCOUNTER — Other Ambulatory Visit: Payer: Self-pay

## 2020-07-02 ENCOUNTER — Ambulatory Visit (INDEPENDENT_AMBULATORY_CARE_PROVIDER_SITE_OTHER): Payer: Medicare Other | Admitting: Urology

## 2020-07-02 ENCOUNTER — Encounter: Payer: Self-pay | Admitting: Urology

## 2020-07-02 VITALS — BP 156/85 | HR 67

## 2020-07-02 DIAGNOSIS — Z466 Encounter for fitting and adjustment of urinary device: Secondary | ICD-10-CM

## 2020-07-03 ENCOUNTER — Encounter: Payer: Medicare Other | Admitting: Registered Nurse

## 2020-07-13 ENCOUNTER — Ambulatory Visit: Payer: Self-pay | Admitting: *Deleted

## 2020-07-13 ENCOUNTER — Telehealth: Payer: Self-pay | Admitting: Gastroenterology

## 2020-07-13 NOTE — Telephone Encounter (Signed)
Patient called to let us know that her BP is running high. Today 12:45 166/90, 1:00 165/70, 3:00 165/70.

## 2020-07-13 NOTE — Telephone Encounter (Signed)
Called by Sela Hua, PTA from [facility] [phone] regarding patient's B/P at 166/90. PTA reports patient denies any symptoms. NT requested to take B/P again. PTA reports patient was at rest greater than 5 minutes on 1st reading. On call B/P 164/78 pulse in the 80's. Patient remained asymptomatic. Patient is going to call back to schedule appt  Within 2 weeks per NT request. Patient is going to complete her PT session due to time scheduled with PT. Patient and PTA verbalized understanding of symptoms to monitor. Care advise given. Patient verbalized understanding of care advise and to call back if symptoms occur.    Reason for Disposition . [1] Systolic BP  >= 696 OR Diastolic >= 80 AND [7] not taking BP medications  Answer Assessment - Initial Assessment Questions 1. BLOOD PRESSURE: "What is the blood pressure?" "Did you take at least two measurements 5 minutes apart?"     B/P 166/90 prior to call  2. ONSET: "When did you take your blood pressure?"     Now  3. HOW: "How did you obtain the blood pressure?" (e.g., visiting nurse, automatic home BP monitor)     Manual monitor  4. HISTORY: "Do you have a history of high blood pressure?"    no 5. MEDICATIONS: "Are you taking any medications for blood pressure?" "Have you missed any doses recently?"     No  6. OTHER SYMPTOMS: "Do you have any symptoms?" (e.g., headache, chest pain, blurred vision, difficulty breathing, weakness)     Denies  7. PREGNANCY: "Is there any chance you are pregnant?" "When was your last menstrual period?"     na  Protocols used: BLOOD PRESSURE - HIGH-A-AH

## 2020-07-14 ENCOUNTER — Encounter: Payer: Medicare Other | Admitting: Registered Nurse

## 2020-07-16 ENCOUNTER — Encounter
Admission: RE | Disposition: A | Discharge status: Home / Self Care | Payer: Self-pay | Source: Home / Self Care | Attending: Gastroenterology

## 2020-07-16 ENCOUNTER — Ambulatory Visit
Admission: RE | Admit: 2020-07-16 | Discharge: 2020-07-16 | Disposition: A | Discharge status: Home / Self Care | Payer: Medicare Other | Attending: Gastroenterology | Admitting: Gastroenterology

## 2020-07-16 ENCOUNTER — Encounter: Payer: Self-pay | Admitting: Gastroenterology

## 2020-07-16 ENCOUNTER — Ambulatory Visit: Payer: Medicare Other | Admitting: Anesthesiology

## 2020-07-16 DIAGNOSIS — Z79899 Other long term (current) drug therapy: Secondary | ICD-10-CM | POA: Insufficient documentation

## 2020-07-16 DIAGNOSIS — K2970 Gastritis, unspecified, without bleeding: Secondary | ICD-10-CM | POA: Insufficient documentation

## 2020-07-16 DIAGNOSIS — K2980 Duodenitis without bleeding: Secondary | ICD-10-CM | POA: Diagnosis not present

## 2020-07-16 DIAGNOSIS — K2100 Gastro-esophageal reflux disease with esophagitis, without bleeding: Secondary | ICD-10-CM | POA: Diagnosis not present

## 2020-07-16 DIAGNOSIS — R1310 Dysphagia, unspecified: Secondary | ICD-10-CM

## 2020-07-16 DIAGNOSIS — K209 Esophagitis, unspecified without bleeding: Secondary | ICD-10-CM

## 2020-07-16 DIAGNOSIS — K449 Diaphragmatic hernia without obstruction or gangrene: Secondary | ICD-10-CM | POA: Diagnosis not present

## 2020-07-16 DIAGNOSIS — Z9221 Personal history of antineoplastic chemotherapy: Secondary | ICD-10-CM | POA: Diagnosis not present

## 2020-07-16 DIAGNOSIS — K319 Disease of stomach and duodenum, unspecified: Secondary | ICD-10-CM | POA: Diagnosis not present

## 2020-07-16 DIAGNOSIS — K222 Esophageal obstruction: Secondary | ICD-10-CM

## 2020-07-16 DIAGNOSIS — Z853 Personal history of malignant neoplasm of breast: Secondary | ICD-10-CM | POA: Diagnosis not present

## 2020-07-16 HISTORY — PX: ESOPHAGOGASTRODUODENOSCOPY (EGD) WITH PROPOFOL: SHX5813

## 2020-07-16 SURGERY — ESOPHAGOGASTRODUODENOSCOPY (EGD) WITH PROPOFOL
Anesthesia: General

## 2020-07-16 MED ORDER — PROPOFOL 500 MG/50ML IV EMUL
INTRAVENOUS | Status: AC
  Filled 2020-07-16: qty 50

## 2020-07-16 MED ORDER — EPHEDRINE 5 MG/ML INJ
INTRAVENOUS | Status: AC
  Filled 2020-07-16: qty 10

## 2020-07-16 MED ORDER — LIDOCAINE HCL (CARDIAC) PF 100 MG/5ML IV SOSY
PREFILLED_SYRINGE | INTRAVENOUS | Status: DC | PRN
  Administered 2020-07-16: 50 mg via INTRAVENOUS

## 2020-07-16 MED ORDER — GLYCOPYRROLATE 0.2 MG/ML IJ SOLN
INTRAMUSCULAR | Status: AC
  Filled 2020-07-16: qty 1

## 2020-07-16 MED ORDER — PROPOFOL 10 MG/ML IV BOLUS
INTRAVENOUS | Status: AC
  Filled 2020-07-16: qty 20

## 2020-07-16 MED ORDER — LIDOCAINE HCL (PF) 2 % IJ SOLN
INTRAMUSCULAR | Status: AC
  Filled 2020-07-16: qty 5

## 2020-07-16 MED ORDER — GLYCOPYRROLATE 0.2 MG/ML IJ SOLN
INTRAMUSCULAR | Status: DC | PRN
  Administered 2020-07-16: .1 mg via INTRAVENOUS

## 2020-07-16 MED ORDER — SODIUM CHLORIDE 0.9 % IV SOLN: INTRAVENOUS | Status: DC

## 2020-07-16 MED ORDER — OMEPRAZOLE 40 MG PO CPDR: 40.0000 mg | DELAYED_RELEASE_CAPSULE | Freq: Every day | ORAL | 0 refills | Status: DC

## 2020-07-16 MED ORDER — PROPOFOL 10 MG/ML IV BOLUS
INTRAVENOUS | Status: DC | PRN
  Administered 2020-07-16: 50 mg via INTRAVENOUS
  Administered 2020-07-16: 40 mg via INTRAVENOUS
  Administered 2020-07-16 (×2): 50 mg via INTRAVENOUS
  Administered 2020-07-16 (×3): 30 mg via INTRAVENOUS
  Administered 2020-07-16: 20 mg via INTRAVENOUS

## 2020-07-16 NOTE — Op Note (Signed)
[facility] Gastroenterology Patient Name: Rebecca [name] Procedure Date: 07/16/2020 8:12 AM MRN: [id number] Account #: [id number] Date of Birth: [birth year] Admit Type: Outpatient Age: 60 Room: [facility] ENDO ROOM 3 Gender: Female Note Status: Finalized Procedure:             Upper GI endoscopy Indications:           Dysphagia Providers:             Raneshia Derick B. Bonna Gains MD, MD Referring MD:          Delsa Grana (Referring MD) Medicines:             Monitored Anesthesia Care Complications:         No immediate complications. Procedure:             Pre-Anesthesia Assessment:                        - Prior to the procedure, a History and Physical was                         performed, and patient medications, allergies and                         sensitivities were reviewed. The patient's tolerance                         of previous anesthesia was reviewed.                        - The risks and benefits of the procedure and the                         sedation options and risks were discussed with the                         patient. All questions were answered and informed                         consent was obtained.                        - Patient identification and proposed procedure were                         verified prior to the procedure by the physician, the                         nurse, the anesthesiologist, the anesthetist and the                         technician. The procedure was verified in the                         procedure room.                        - ASA Grade Assessment: II - A patient with mild                         systemic disease.  After obtaining informed consent, the endoscope was                         passed under direct vision. Throughout the procedure,                         the patient's blood pressure, pulse, and oxygen                         saturations were monitored continuously. The Endoscope                          was introduced through the mouth, and advanced to the                         second part of duodenum. The upper GI endoscopy was                         accomplished with ease. The patient tolerated the                         procedure well. Findings:      The examined esophagus was normal.      LA Grade A (one or more mucosal breaks less than 5 mm, not extending       between tops of 2 mucosal folds) esophagitis with no bleeding was found       in the distal esophagus.      A mild Schatzki ring was found in the distal esophagus. Mild resistance       was noted at the site of the ring on passing the scope. Given patient's       dysphagia dilation is indicated. A TTS dilator was passed through the       scope. Dilation with an 18-19-20 mm balloon dilator was performed to 20       mm. No heme effect of tears were noted after dilation but there was       improvement in resistance noted at the site compared to before the       dilation. Careful sequential dilation was done with the balloon held in       place for 1 minute each time after dilation to 66mm, 64mm, and then 18mm       and re-examination after each dilation size before proceeding to       dilation to a bigger size.      The exam of the esophagus was otherwise normal.      Patchy mildly erythematous mucosa without bleeding was found in the       gastric antrum. Biopsies were taken with a cold forceps for histology.       Biopsies were obtained in the gastric body, at the incisura and in the       gastric antrum with cold forceps for histology.      A 3 cm hiatal hernia was present.      The exam of the stomach was otherwise normal.      Patchy mild mucosal changes characterized by discoloration were found in       the second portion of the duodenum. Biopsies were taken with a cold       forceps for histology.  The exam of the duodenum was otherwise normal. Impression:            - Normal esophagus.                         - LA Grade A reflux esophagitis with no bleeding.                        - Mild Schatzki ring. Dilated.                        - Erythematous mucosa in the antrum. Biopsied.                        - 3 cm hiatal hernia.                        - Mucosal changes in the duodenum. Biopsied.                        - Biopsies were obtained in the gastric body, at the                         incisura and in the gastric antrum. Recommendation:        - Await pathology results.                        - Take prescribed proton pump inhibitor or H2 blocker                         (antacid) medications 30 - 60 minutes before meals.                        - Follow an antireflux regimen.                        - Discharge patient to home (with escort).                        - Advance diet as tolerated.                        - Continue present medications.                        - Patient has a contact number available for                         emergencies. The signs and symptoms of potential                         delayed complications were discussed with the patient.                         Return to normal activities tomorrow. Written                         discharge instructions were provided to the patient.                        -  Discharge patient to home (with escort).                        - The findings and recommendations were discussed with                         the patient.                        - The findings and recommendations were discussed with                         the patient's family. Procedure Code(s):     --- Professional ---                        [phone], Esophagogastroduodenoscopy, flexible,                         transoral; with transendoscopic balloon dilation of                         esophagus (less than 30 mm diameter)                        43239, 59, Esophagogastroduodenoscopy, flexible,                         transoral; with biopsy, single or  multiple Diagnosis Code(s):     --- Professional ---                        K21.00, Gastro-esophageal reflux disease with                         esophagitis, without bleeding                        K22.2, Esophageal obstruction                        K31.89, Other diseases of stomach and duodenum                        K44.9, Diaphragmatic hernia without obstruction or                         gangrene                        R13.10, Dysphagia, unspecified CPT copyright 2019 American Medical Association. All rights reserved. The codes documented in this report are preliminary and upon coder review may  be revised to meet current compliance requirements.  Vonda Antigua, MD Margretta Sidle B. Bonna Gains MD, MD 07/16/2020 8:52:14 AM This report has been signed electronically. Number of Addenda: 0 Note Initiated On: 07/16/2020 8:12 AM Estimated Blood Loss:  Estimated blood loss: none.      [facility]

## 2020-07-16 NOTE — Anesthesia Preprocedure Evaluation (Signed)
Anesthesia Evaluation  Patient identified by MRN, date of birth, ID band Patient awake    Reviewed: Allergy & Precautions, NPO status , Patient's Chart, lab work & pertinent test results  History of Anesthesia Complications (+) PONV and history of anesthetic complications  Airway Mallampati: II  TM Distance: >3 FB Neck ROM: Full    Dental no notable dental hx. (+) Teeth Intact   Pulmonary neg pulmonary ROS, neg sleep apnea, neg COPD, Patient abstained from smoking.Not current smoker,    Pulmonary exam normal breath sounds clear to auscultation       Cardiovascular Exercise Tolerance: Good METS(-) hypertension(-) CAD and (-) Past MI negative cardio ROS  (-) dysrhythmias  Rhythm:Regular Rate:Normal - Systolic murmurs    Neuro/Psych Seizures -, Well Controlled,   Neuromuscular disease negative psych ROS   GI/Hepatic neg GERD  ,(+)     (-) substance abuse  ,   Endo/Other  neg diabetes  Renal/GU negative Renal ROS     Musculoskeletal   Abdominal   Peds  Hematology  (+) anemia ,   Anesthesia Other Findings Past Medical History: No date: Allergy No date: Aortic valve disease     Comment:  Mild AS / AI - most recent Echo demonstrated tricuspid               aortic valve. No date: Bacterial endocarditis     Comment:  History of . No date: Bilateral lower extremity edema     Comment:  Noncardiac.  Chronic. LE Venous dopplers - negative for               DVT.; Echocardiogram January 2016: Normal EF with normal               wall motion and valve function. Only grade 1 diastolic               dysfunction. EF 60-65%. Mild MR 12-31-13: Breast cancer ([place])     Comment:  Right breast, 12:00, 1.5 cm, T1c,N0 invasive mammary               carcinoma, triple negative. --> Rx with Chemo No date: Cervical stenosis of spine No date: Herpes zoster No date: IBS (irritable bowel syndrome) No date: Lymphedema     Comment:  has  legs wrapped at [place]: Multiple sclerosis ([place])     Comment:  Walks from room to room @ home; but Wheelchair when               going out. No date: Neuromuscular disorder (HCC)     Comment:  MS No date: PONV (postoperative nausea and vomiting)     Comment:  Related to Fentanyl No date: Seizures (HCC)     Comment:  Takes Keppra No date: Syncope and collapse  Reproductive/Obstetrics                             Anesthesia Physical Anesthesia Plan  ASA: 3  Anesthesia Plan: General   Post-op Pain Management:    Induction: Intravenous  PONV Risk Score and Plan: 4 or greater and Ondansetron, Propofol infusion and TIVA  Airway Management Planned: Nasal Cannula  Additional Equipment: None  Intra-op Plan:   Post-operative Plan:   Informed Consent: I have reviewed the patients History and Physical, chart, labs and discussed the procedure including the risks, benefits and alternatives for the proposed anesthesia with the patient or authorized representative who has indicated  his/her understanding and acceptance.     Dental advisory given  Plan Discussed with: CRNA and Surgeon  Anesthesia Plan Comments: (Discussed risks of anesthesia with patient, including possibility of difficulty with spontaneous ventilation under anesthesia necessitating airway intervention, PONV, and rare risks such as cardiac or respiratory or neurological events. Patient understands.)        Anesthesia Quick Evaluation

## 2020-07-16 NOTE — Transfer of Care (Signed)
Immediate Anesthesia Transfer of Care Note  Patient: Rebecca [name]  Procedure(s) Performed: ESOPHAGOGASTRODUODENOSCOPY (EGD) WITH PROPOFOL  Patient Location: PACU and Endoscopy Unit  Anesthesia Type:General  Level of Consciousness: drowsy and patient cooperative  Airway & Oxygen Therapy: Patient Spontanous Breathing  Post-op Assessment: Report given to RN and Post -op Vital signs reviewed and stable  Post vital signs: Reviewed and stable  Last Vitals:  Vitals Value Taken Time  BP 148/70 07/16/20 0841  Temp    Pulse 96 07/16/20 0841  Resp 14 07/16/20 0841  SpO2 98 % 07/16/20 0841  Vitals shown include unvalidated device data.  Last Pain:  Vitals:   07/16/20 0746  TempSrc: Temporal  PainSc: 0-No pain         Complications: No notable events documented.

## 2020-07-16 NOTE — Anesthesia Procedure Notes (Signed)
Procedure Name: MAC Date/Time: 07/16/2020 8:15 AM Performed by: Jerrye Noble, CRNA Pre-anesthesia Checklist: Patient identified, Emergency Drugs available, Suction available and Patient being monitored Patient Re-evaluated:Patient Re-evaluated prior to induction Oxygen Delivery Method: Nasal cannula

## 2020-07-16 NOTE — Anesthesia Postprocedure Evaluation (Signed)
Anesthesia Post Note  Patient: Rebecca [name]  Procedure(s) Performed: ESOPHAGOGASTRODUODENOSCOPY (EGD) WITH PROPOFOL  Patient location during evaluation: Endoscopy Anesthesia Type: General Level of consciousness: awake and alert Pain management: pain level controlled Vital Signs Assessment: post-procedure vital signs reviewed and stable Respiratory status: spontaneous breathing, nonlabored ventilation, respiratory function stable and patient connected to nasal cannula oxygen Cardiovascular status: blood pressure returned to baseline and stable Postop Assessment: no apparent nausea or vomiting Anesthetic complications: no   No notable events documented.   Last Vitals:  Vitals:   07/16/20 0900 07/16/20 0910  BP: (!) 158/75 (!) 161/79  Pulse: 88 82  Resp: 13 10  Temp:    SpO2: 98% 100%    Last Pain:  Vitals:   07/16/20 0746  TempSrc: Temporal  PainSc: 0-No pain                 Arita Miss

## 2020-07-16 NOTE — H&P (Signed)
Rebecca Antigua, MD [address], [place], [place], [place], [address] [address], [place], [place], [place], [address] Phone: [phone]  Fax: [phone]  Primary Care Physician:  Delsa Grana, PA-C   Pre-Procedure History & Physical: HPI:  Rebecca [name] is a 60 y.o. female is here for an EGD.   Past Medical History:  Diagnosis Date   Allergy    Aortic valve disease    Mild AS / AI - most recent Echo demonstrated tricuspid aortic valve.   Bacterial endocarditis    History of .   Bilateral lower extremity edema    Noncardiac.  Chronic. LE Venous dopplers - negative for DVT.; Echocardiogram January 2016: Normal EF with normal wall motion and valve function. Only grade 1 diastolic dysfunction. EF 60-65%. Mild MR   Breast cancer ([place]) 12-31-13   Right breast, 12:00, 1.5 cm, T1c,N0 invasive mammary carcinoma, triple negative. --> Rx with Chemo   Cervical stenosis of spine    Herpes zoster    IBS (irritable bowel syndrome)    Lymphedema    has legs wrapped at [facility]   Multiple sclerosis ([place]) 2001   Walks from room to room @ home; but Wheelchair when going out.   Neuromuscular disorder ([place])    MS   PONV (postoperative nausea and vomiting)    Related to Fentanyl   Seizures ([place])    Takes Keppra   Syncope and collapse     Past Surgical History:  Procedure Laterality Date   ANKLE SURGERY     Left   ANKLE SURGERY     ANTERIOR CERVICAL DECOMP/DISCECTOMY FUSION  11/17/2011   Procedure: ANTERIOR CERVICAL DECOMPRESSION/DISCECTOMY FUSION 2 LEVELS;  Surgeon: Erline Levine, MD;  Location: [facility];  Service: Neurosurgery;  Laterality: N/A;  Cervical Five-Six Six-Seven Anterior cervical decompression/diskectomy/fusion   BREAST BIOPSY Right 12-31-13   invasive mammary   BREAST SURGERY Right 02/03/2014   Right simple mastectomy with sentinel node biopsy.   CHOLECYSTECTOMY     COLONOSCOPY  2014   HYSTEROSCOPY WITH D & C N/A 11/27/2018   Procedure: DILATATION AND  CURETTAGE /HYSTEROSCOPY;  Surgeon: Gae Dry, MD;  Location: [facility] ORS;  Service: Gynecology;  Laterality: N/A;   Lower extremity venous Dopplers  Feb 27, 2013   No LE DVT   MASTECTOMY Right 2015   Port a cath insertion Right 01/19/2010   PORT-A-CATH REMOVAL     right   PORT-A-CATH REMOVAL Right 09/03/2013   Procedure: REMOVAL PORT-A-CATH;  Surgeon: Conrad Rose Hills, MD;  Location: [facility];  Service: Vascular;  Laterality: Right;   SPINE SURGERY  Cervical steinois Dr. Vertell Limber 2014 I think?   TONSILLECTOMY     TRANSTHORACIC ECHOCARDIOGRAM  03/2013; 02/2014   a) Normal LV size and function with EF 60-65%.; Cannot exclude bicuspid aortic valve with mild AS and mild AI.; b) Normal EF with normal wall motion and valve function x Mild MR. G2 DD. EF 60-65%. Tricuspid AoV   UPPER GI ENDOSCOPY  2014    Prior to Admission medications   Medication Sig Start Date End Date Taking? Authorizing Provider  amantadine (SYMMETREL) 100 MG capsule Take 1 capsule (100 mg total) by mouth 3 (three) times daily. 12/16/19  Yes Penumalli, Earlean Polka, MD  baclofen (LIORESAL) 10 MG tablet TAKE 1 TO 2 TABLETS BY MOUTH 3 TIMES A DAY 10/30/19  Yes Penumalli, Earlean Polka, MD  Cranberry 1000 MG CAPS Take by mouth.   Yes [provider]  fluticasone (FLONASE) 50  MCG/ACT nasal spray SPRAY 2 SPRAYS INTO EACH NOSTRIL EVERY DAY 04/08/19  Yes Delsa Grana, PA-C  gluconic acid-citric acid (RENACIDIN) irrigation IRRIGATE WITH 30 MLS AS DIRECTED 3 (THREE) TIMES DAILY. 06/30/20 10/28/20 Yes Vaillancourt, Samantha, PA-C  Glucosamine-Chondroitin (COSAMIN DS PO) Take 1 tablet by mouth 2 (two) times daily.   Yes [provider]  levETIRAcetam (KEPPRA) 500 MG tablet Take 1 tablet (500 mg total) by mouth 2 (two) times daily. 12/16/19  Yes Penumalli, Earlean Polka, MD  lipase/protease/amylase (CREON) 36000 UNITS CPEP capsule Take 1 capsule (36,000 Units total) by mouth with breakfast, with lunch, and with evening meal AND 1 capsule (36,000 Units  total) with snacks. 06/16/20 09/14/20 Yes Virgel Manifold, MD  oxybutynin (DITROPAN-XL) 10 MG 24 hr tablet Take 1 tablet (10 mg total) by mouth 2 (two) times daily. 09/11/19  Yes Penumalli, Vikram R, MD  tiZANidine (ZANAFLEX) 4 MG tablet TAKE 1 TABLET (4 MG TOTAL) BY MOUTH 2 (TWO) TIMES DAILY. 09/05/19  Yes Jamse Arn, MD  Turmeric 500 MG CAPS Take 500 mg by mouth daily.   Yes [provider]  Disposable Gloves (ASSURANCE VINYL EXAM GLOVES) MISC Neurogenic bladder; MS; LON 99 months; for use for personal hygiene 12/26/17   [name], MD  ibuprofen (ADVIL) 600 MG tablet TAKE 1 TABLET (600 MG TOTAL) BY MOUTH EVERY 8 (EIGHT) HOURS AS NEEDED. 03/30/20   Delsa Grana, PA-C  lidocaine-prilocaine (EMLA) cream Apply 1 application topically as needed. 09/20/19   Lloyd Huger, MD  Misc Natural Products (LEG VEIN & CIRCULATION) TABS Take 1 tablet by mouth 2 (two) times daily.     [provider]  Multiple Vitamins-Minerals (HAIR SKIN AND NAILS FORMULA) TABS Take 1 tablet by mouth 2 (two) times daily.     [provider]  Multiple Vitamins-Minerals (MULTIVITAMIN PO) Take 1 tablet by mouth daily.     [provider]  nystatin (MYCOSTATIN/NYSTOP) powder Apply 1 application topically 3 (three) times daily as needed. For rash/raw skin as needed 07/31/19   Delsa Grana, PA-C  REBIF REBIDOSE 44 MCG/0.5ML SOAJ Inject  44 mcg Sub-q 3 times weekly Rotate site after each injection 01/28/20   Penumalli, Earlean Polka, MD    Allergies as of 06/24/2020 - Review Complete 06/24/2020  Allergen Reaction Noted   Fentanyl Nausea And Vomiting and Nausea Only 02/04/2014   Sulfa antibiotics Hives and Other (See Comments) 06/08/2011    Family History  Problem Relation Age of Onset   Cancer Father        skin   Heart disease Father    Heart attack Father        heart attack in his 53's   Thyroid disease Sister    Ovarian cancer Cousin    Breast cancer Maternal Aunt 60    Breast cancer Maternal Grandmother 74   Bladder Cancer Neg Hx    Kidney cancer Neg Hx     Social History   Socioeconomic History   Marital status: Married    Spouse name: don   Number of children: 0   Years of education: 12   Highest education level: Not on file  Occupational History   Occupation: disability  Tobacco Use   Smoking status: Never   Smokeless tobacco: Never  Vaping Use   Vaping Use: Never used  Substance and Sexual Activity   Alcohol use: No   Drug use: No   Sexual activity: Not Currently    Partners: Male  Birth control/protection: None  Other Topics Concern   Not on file  Social History Narrative   She is married. Recently moved back to [place] after being in [place] for some time. She is accompanied by her husband and aunt.   Never smoked. Never used alcohol.   Social Determinants of Health   Financial Resource Strain: Low Risk    Difficulty of Paying Living Expenses: Not hard at all  Food Insecurity: No Food Insecurity   Worried About Charity fundraiser in the Last Year: Never true   [place] in the Last Year: Never true  Transportation Needs: No Transportation Needs   Lack of Transportation (Medical): No   Lack of Transportation (Non-Medical): No  Physical Activity: Inactive   Days of Exercise per Week: 0 days   Minutes of Exercise per Session: 0 min  Stress: No Stress Concern Present   Feeling of Stress : Not at all  Social Connections: Moderately Isolated   Frequency of Communication with Friends and Family: More than three times a week   Frequency of Social Gatherings with Friends and Family: Not on file   Attends Religious Services: Never   Marine scientist or Organizations: No   Attends Music therapist: Never   Marital Status: Married  Human resources officer Violence: Not At Risk   Fear of Current or Ex-Partner: No   Emotionally Abused: No   Physically Abused: No   Sexually Abused: No    Review of  Systems: See HPI, otherwise negative ROS  Physical Exam: BP (!) 179/86   Pulse 80   Temp (!) 96.8 F (36 C) (Temporal)   Resp 16   Ht 5\' 4"  (1.626 m)   Wt 97.1 kg   SpO2 99%   BMI 36.73 kg/m  General:   Alert,  pleasant and cooperative in NAD Head:  Normocephalic and atraumatic. Neck:  Supple; no masses or thyromegaly. Lungs:  Clear throughout to auscultation, normal respiratory effort.    Heart:  +S1, +S2, Regular rate and rhythm, No edema. Abdomen:  Soft, nontender and nondistended. Normal bowel sounds, without guarding, and without rebound.   Neurologic:  Alert and  oriented x4;  grossly normal neurologically.  Impression/Plan: VELLA COLQUITT is here for an EGD for dysphagia  Risks, benefits, limitations, and alternatives regarding the procedure have been reviewed with the patient.  Questions have been answered.  All parties agreeable.   Virgel Manifold, MD  07/16/2020, 8:08 AM

## 2020-07-20 LAB — SURGICAL PATHOLOGY

## 2020-07-21 ENCOUNTER — Telehealth: Payer: Self-pay

## 2020-07-21 ENCOUNTER — Encounter: Payer: Self-pay | Admitting: Gastroenterology

## 2020-07-21 NOTE — Telephone Encounter (Signed)
Just witting this message down for documentation . Pt is requesting Tizanidine as Dr.Patel has prescribed it before how ever she was told she needs a follow up appt in order for [place] to fill any medications she understood , how ever she is having transportation issues and wont make it into the appt tomorrow June 15th she had this appt scheduled with Danella Sensing NP . She will call back to reschedule .

## 2020-07-22 ENCOUNTER — Encounter: Payer: Medicare Other | Admitting: Registered Nurse

## 2020-07-27 ENCOUNTER — Other Ambulatory Visit: Payer: Self-pay | Admitting: Family Medicine

## 2020-08-01 ENCOUNTER — Emergency Department
Admission: EM | Admit: 2020-08-01 | Discharge: 2020-08-01 | Disposition: A | Discharge status: Home / Self Care | Payer: Medicare Other | Attending: Emergency Medicine | Admitting: Emergency Medicine

## 2020-08-01 ENCOUNTER — Other Ambulatory Visit: Payer: Self-pay

## 2020-08-01 DIAGNOSIS — Y846 Urinary catheterization as the cause of abnormal reaction of the patient, or of later complication, without mention of misadventure at the time of the procedure: Secondary | ICD-10-CM | POA: Diagnosis not present

## 2020-08-01 DIAGNOSIS — Z853 Personal history of malignant neoplasm of breast: Secondary | ICD-10-CM | POA: Diagnosis not present

## 2020-08-01 DIAGNOSIS — T83091A Other mechanical complication of indwelling urethral catheter, initial encounter: Secondary | ICD-10-CM | POA: Diagnosis not present

## 2020-08-01 DIAGNOSIS — T839XXA Unspecified complication of genitourinary prosthetic device, implant and graft, initial encounter: Secondary | ICD-10-CM

## 2020-08-01 LAB — URINALYSIS, COMPLETE (UACMP) WITH MICROSCOPIC
Bilirubin Urine: NEGATIVE
Glucose, UA: NEGATIVE mg/dL
Ketones, ur: NEGATIVE mg/dL
Nitrite: NEGATIVE
Protein, ur: NEGATIVE mg/dL
Specific Gravity, Urine: 1.011 (ref 1.005–1.030)
WBC, UA: >50 WBC/hpf — ABNORMAL HIGH (ref 0–5)
pH: 5 (ref 5.0–8.0)

## 2020-08-01 NOTE — ED Triage Notes (Signed)
Pt sent to ER for leaking around foley cath. Catheter was placed Tuesday of this week. Pt states it started leaking around it today. A&O, bed bound. On stretcher. No distress noted.

## 2020-08-01 NOTE — ED Notes (Signed)
Upon inspection pt foley completely out. Pt states she has a 34f foley. Replaced foley with new one at this time. Cleaned pt of urine, changed into dry clothes. Provided pt with leg bag.

## 2020-08-01 NOTE — ED Provider Notes (Signed)
[facility]-EMERGENCY DEPARTMENT  ____________________________________________  Time seen: Approximately 8:35 PM  I have reviewed the triage vital signs and the nursing notes.   HISTORY  Chief Complaint Foley catheter leaking   Historian Patient    HPI Rebecca [name] is a 60 y.o. female presents to the emergency department as patient's Foley catheter came out.  Patient denies low back pain or fever.  No abdominal pain.  No other alleviating measures have been attempted.   Past Medical History:  Diagnosis Date   Allergy    Aortic valve disease    Mild AS / AI - most recent Echo demonstrated tricuspid aortic valve.   Bacterial endocarditis    History of .   Bilateral lower extremity edema    Noncardiac.  Chronic. LE Venous dopplers - negative for DVT.; Echocardiogram January 2016: Normal EF with normal wall motion and valve function. Only grade 1 diastolic dysfunction. EF 60-65%. 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 --> Rx with Chemo   Cervical stenosis of spine    Herpes zoster    IBS (irritable bowel syndrome)    Lymphedema    has legs wrapped at [facility]   Multiple sclerosis ([place]) 2001   Walks from room to room @ home; but Wheelchair when going out.   Neuromuscular disorder ([place])    MS   PONV (postoperative nausea and vomiting)    Related to Fentanyl   Seizures (HCC)    Takes Keppra   Syncope and collapse      Immunizations up to date:  Yes.     Past Medical History:  Diagnosis Date   Allergy    Aortic valve disease    Mild AS / AI - most recent Echo demonstrated tricuspid aortic valve.   Bacterial endocarditis    History of .   Bilateral lower extremity edema    Noncardiac.  Chronic. LE Venous dopplers - negative for DVT.; Echocardiogram January 2016: Normal EF with normal wall motion and valve function. Only grade 1 diastolic dysfunction. EF 60-65%. Mild MR   Breast cancer  ([place]) 12-31-13   Right breast, 12:00, 1.5 cm, T1c,N0 invasive mammary carcinoma, triple negative. --> Rx with Chemo   Cervical stenosis of spine    Herpes zoster    IBS (irritable bowel syndrome)    Lymphedema    has legs wrapped at [facility]   Multiple sclerosis ([place]) 2001   Walks from room to room @ home; but Wheelchair when going out.   Neuromuscular disorder (HCC)    MS   PONV (postoperative nausea and vomiting)    Related to Fentanyl   Seizures (HCC)    Takes Keppra   Syncope and collapse     Patient Active Problem List   Diagnosis Date Noted   Dysphagia    Acute esophagitis    Gastric erythema    Mucosal abnormality of duodenum    Schatzki's ring    Diarrhea 04/14/2020   Elevated BP without diagnosis of hypertension 04/14/2020   Urinary tract infection associated with indwelling urethral catheter ([place]) 02/17/2020   Muscle spasm 02/28/2019   Post-menopausal bleeding 11/20/2018   Ambulatory dysfunction 02/02/2018   Abnormality of gait 11/17/2017   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   Bilateral lower extremity pain (primary) (bilateral) (right greater than left) 08/01/2016   Chronic neck pain (secondary) (bilateral) ( left greater than right) 07/28/2016   Neutropenia ([place]) 06/27/2016  Chronic pain syndrome 06/03/2016   Neurogenic bladder    Neurogenic bowel    Detrusor and sphincter dyssynergia    Urinary incontinence    Seizure disorder ([place])    Slow transit constipation    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[place])    Lymphedema    Encounter for screening for cervical cancer 11/27/2015   Preventative health care 11/25/2015   History of breast cancer in female 10/28/2015   Anemia 10/28/2015   Thrombocytopenia ([place]) 10/28/2015   Allergic rhinitis 10/28/2015   IBS (irritable bowel syndrome) 10/28/2015   Uterine leiomyoma 10/24/2014   History of right mastectomy 10/24/2014   Medicare annual wellness visit, subsequent 10/24/2014   MS (multiple  sclerosis) ([place]) 35/00/9381   Hematoma complicating a procedure 03/06/2014   Primary cancer of right female breast ([place]) 01/10/2014   SOB (shortness of breath) 03/01/2013   Bilateral lower extremity edema    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   CHOLECYSTECTOMY     COLONOSCOPY  2014   ESOPHAGOGASTRODUODENOSCOPY (EGD) WITH PROPOFOL N/A 07/16/2020   Procedure: ESOPHAGOGASTRODUODENOSCOPY (EGD) WITH PROPOFOL;  Surgeon: Virgel Manifold, MD;  Location: [facility] ENDOSCOPY;  Service: Endoscopy;  Laterality: N/A;  Patient has MS and will need assistance   HYSTEROSCOPY WITH D & C N/A 11/27/2018   Procedure: DILATATION AND CURETTAGE /HYSTEROSCOPY;  Surgeon: Gae Dry, MD;  Location: [facility] ORS;  Service: Gynecology;  Laterality: N/A;   Lower extremity venous Dopplers  Feb 27, 2013   No LE DVT   MASTECTOMY Right 2015   Port a cath insertion Right 01/19/2010   PORT-A-CATH REMOVAL     right   PORT-A-CATH REMOVAL Right 09/03/2013   Procedure: REMOVAL PORT-A-CATH;  Surgeon: Conrad Turley, MD;  Location: [facility];  Service: Vascular;  Laterality: Right;   SPINE SURGERY  Cervical steinois Dr. Vertell Limber 2014 I think?   TONSILLECTOMY     TRANSTHORACIC ECHOCARDIOGRAM  03/2013; 02/2014   a) Normal LV size and function with EF 60-65%.; Cannot exclude bicuspid aortic valve with mild AS and mild AI.; b) Normal EF with normal wall motion and valve function x Mild MR. G2  DD. EF 60-65%. Tricuspid AoV   UPPER GI ENDOSCOPY  2014    Prior to Admission medications   Medication Sig Start Date End Date Taking? Authorizing Provider  amantadine (SYMMETREL) 100 MG capsule Take 1 capsule (100 mg total) by mouth 3 (three) times daily. 12/16/19   Penumalli, Earlean Polka, MD  baclofen (LIORESAL) 10 MG tablet TAKE 1 TO 2 TABLETS BY MOUTH 3 TIMES A DAY 10/30/19   Penumalli, Earlean Polka, MD  Cranberry 1000 MG CAPS Take by mouth.    [provider]  Disposable Gloves (ASSURANCE VINYL EXAM GLOVES) MISC Neurogenic bladder; MS; LON 99 months; for use for personal hygiene 12/26/17   [name], MD  fluticasone (FLONASE) 50 MCG/ACT nasal spray SPRAY 2 SPRAYS INTO EACH NOSTRIL EVERY DAY 04/08/19   Delsa Grana, PA-C  gluconic acid-citric acid (  RENACIDIN) irrigation IRRIGATE WITH 30 MLS AS DIRECTED 3 (THREE) TIMES DAILY. 06/30/20 10/28/20  Vaillancourt, Aldona Bar, PA-C  Glucosamine-Chondroitin (COSAMIN DS PO) Take 1 tablet by mouth 2 (two) times daily.    [provider]  ibuprofen (ADVIL) 600 MG tablet TAKE 1 TABLET BY MOUTH EVERY 8 HOURS AS NEEDED. 07/27/20   Delsa Grana, PA-C  levETIRAcetam (KEPPRA) 500 MG tablet Take 1 tablet (500 mg total) by mouth 2 (two) times daily. 12/16/19   Penumalli, Earlean Polka, MD  lidocaine-prilocaine (EMLA) cream Apply 1 application topically as needed. 09/20/19   Lloyd Huger, MD  lipase/protease/amylase (CREON) 36000 UNITS CPEP capsule Take 1 capsule (36,000 Units total) by mouth with breakfast, with lunch, and with evening meal AND 1 capsule (36,000 Units total) with snacks. 06/16/20 09/14/20  Virgel Manifold, MD  Misc Natural Products (LEG VEIN & CIRCULATION) TABS Take 1 tablet by mouth 2 (two) times daily.     [provider]  Multiple Vitamins-Minerals (HAIR SKIN AND NAILS FORMULA) TABS Take 1 tablet by mouth 2 (two) times daily.     [provider]  Multiple Vitamins-Minerals (MULTIVITAMIN PO) Take 1 tablet by mouth  daily.     [provider]  nystatin (MYCOSTATIN/NYSTOP) powder Apply 1 application topically 3 (three) times daily as needed. For rash/raw skin as needed 07/31/19   Delsa Grana, PA-C  omeprazole (PRILOSEC) 40 MG capsule Take 1 capsule (40 mg total) by mouth daily. 07/16/20 08/15/20  Virgel Manifold, MD  oxybutynin (DITROPAN-XL) 10 MG 24 hr tablet Take 1 tablet (10 mg total) by mouth 2 (two) times daily. 09/11/19   Penumalli, Earlean Polka, MD  REBIF REBIDOSE 44 MCG/0.5ML SOAJ Inject  44 mcg Sub-q 3 times weekly Rotate site after each injection 01/28/20   Penumalli, Vikram R, MD  tiZANidine (ZANAFLEX) 4 MG tablet TAKE 1 TABLET (4 MG TOTAL) BY MOUTH 2 (TWO) TIMES DAILY. 09/05/19   Jamse Arn, MD  Turmeric 500 MG CAPS Take 500 mg by mouth daily.    [provider]    Allergies Fentanyl and Sulfa antibiotics  Family History  Problem Relation Age of Onset   Cancer Father        skin   Heart disease Father    Heart attack Father        heart attack in his 78's   Thyroid disease Sister    Ovarian cancer Cousin    Breast cancer Maternal Aunt 60   Breast cancer Maternal Grandmother 74   Bladder Cancer Neg Hx    Kidney cancer Neg Hx     Social History Social History   Tobacco Use   Smoking status: Never   Smokeless tobacco: Never  Vaping Use   Vaping Use: Never used  Substance Use Topics   Alcohol use: No   Drug use: No     Review of Systems  Constitutional: No fever/chills Eyes:  No discharge ENT: No upper respiratory complaints. Respiratory: no cough. No SOB/ use of accessory muscles to breath Gastrointestinal:   No nausea, no vomiting.  No diarrhea.  No constipation. Musculoskeletal: Negative for musculoskeletal pain. Skin: Negative for rash, abrasions, lacerations, ecchymosis.    ____________________________________________   PHYSICAL EXAM:  VITAL SIGNS: ED Triage Vitals  Enc Vitals Group     BP 08/01/20 1758 (!) 184/97     Pulse Rate 08/01/20  1758 80     Resp 08/01/20 1758 16     Temp 08/01/20 1758 98.1 F (36.7 C)  Temp Source 08/01/20 1758 Oral     SpO2 08/01/20 1758 100 %     Weight 08/01/20 1759 214 lb (97.1 kg)     Height 08/01/20 1759 5\' 4"  (1.626 m)     Head Circumference --      Peak Flow --      Pain Score 08/01/20 1759 0     Pain Loc --      Pain Edu? --      Excl. in [place]? --      Constitutional: Alert and oriented. Well appearing and in no acute distress. Eyes: Conjunctivae are normal. PERRL. EOMI. Head: Atraumatic. ENT:      Nose: No congestion/rhinnorhea.      Mouth/Throat: Mucous membranes are moist.  Neck: No stridor.  No cervical spine tenderness to palpation. Cardiovascular: Normal rate, regular rhythm. Normal S1 and S2.  Good peripheral circulation. Respiratory: Normal respiratory effort without tachypnea or retractions. Lungs CTAB. Good air entry to the bases with no decreased or absent breath sounds Gastrointestinal: Bowel sounds x 4 quadrants. Soft and nontender to palpation. No guarding or rigidity. No distention. Musculoskeletal: Full range of motion to all extremities. No obvious deformities noted Neurologic:  Normal for age. No gross focal neurologic deficits are appreciated.  Skin:  Skin is warm, dry and intact. No rash noted. Psychiatric: Mood and affect are normal for age. Speech and behavior are normal.   ____________________________________________   LABS (all labs ordered are listed, but only abnormal results are displayed)  Labs Reviewed  URINALYSIS, COMPLETE (UACMP) WITH MICROSCOPIC - Abnormal; Notable for the following components:      Result Value   Color, Urine YELLOW (*)    APPearance CLOUDY (*)    Hgb urine dipstick MODERATE (*)    Leukocytes,Ua LARGE (*)    WBC, UA >50 (*)    Bacteria, UA FEW (*)    All other components within normal limits  URINE CULTURE    ____________________________________________  EKG   ____________________________________________  RADIOLOGY   No results found.  ____________________________________________    PROCEDURES  Procedure(s) performed:     Procedures     Medications - No data to display   ____________________________________________   INITIAL IMPRESSION / ASSESSMENT AND PLAN / ED COURSE  Pertinent labs & imaging results that were available during my care of the patient were reviewed by me and considered in my medical decision making (see chart for details).      Assessment and plan Foley catheter problem. 60 year old female presents to the emergency department after her Foley catheter became dislodged.  Foley catheter was replaced in the emergency department.  Urinalysis is consistent with prior UAs in the past.  We will have patient follow-up with urology as needed.     ____________________________________________  FINAL CLINICAL IMPRESSION(S) / ED DIAGNOSES  Final diagnoses:  Problem with Foley catheter, initial encounter ([place])      NEW MEDICATIONS STARTED DURING THIS VISIT:  ED Discharge Orders     None           This chart was dictated using voice recognition software/Dragon. Despite best efforts to proofread, errors can occur which can change the meaning. Any change was purely unintentional.     Lannie Fields, PA-C 08/01/20 2225    Vanessa Dassel, MD 08/02/20 [phone]

## 2020-08-04 ENCOUNTER — Ambulatory Visit: Payer: Self-pay | Admitting: Urology

## 2020-08-04 LAB — URINE CULTURE: Culture: >=100000 — AB

## 2020-08-05 NOTE — Progress Notes (Signed)
ED Antimicrobial Stewardship Positive Culture Follow Up   Rebecca [name] is an 60 y.o. female who presented to [facility] on 08/01/2020 with a chief complaint of  Chief Complaint  Patient presents with   Foley catheter leaking    Recent Results (from the past 720 hour(s))  Urine culture     Status: Abnormal   Collection Time: 08/01/20  7:19 PM   Specimen: Urine, Random  Result Value Ref Range Status   Specimen Description   Final    URINE, RANDOM Performed at [facility], [address], [place], [place] [address]    Special Requests   Final    NONE Performed at [facility], [address], [place], [place] [address]    Culture (A)  Final    >=100,000 COLONIES/mL KLEBSIELLA PNEUMONIAE 60,000 COLONIES/mL ESCHERICHIA COLI    Report Status 08/04/2020 FINAL  Final   Organism ID, Bacteria KLEBSIELLA PNEUMONIAE (A)  Final   Organism ID, Bacteria ESCHERICHIA COLI (A)  Final      Susceptibility   Escherichia coli - MIC*    AMPICILLIN <=2 SENSITIVE Sensitive     CEFAZOLIN <=4 SENSITIVE Sensitive     CEFEPIME <=0.12 SENSITIVE Sensitive     CEFTRIAXONE <=0.25 SENSITIVE Sensitive     CIPROFLOXACIN <=0.25 SENSITIVE Sensitive     GENTAMICIN <=1 SENSITIVE Sensitive     IMIPENEM <=0.25 SENSITIVE Sensitive     NITROFURANTOIN <=16 SENSITIVE Sensitive     TRIMETH/SULFA <=20 SENSITIVE Sensitive     AMPICILLIN/SULBACTAM <=2 SENSITIVE Sensitive     PIP/TAZO <=4 SENSITIVE Sensitive     * 60,000 COLONIES/mL ESCHERICHIA COLI   Klebsiella pneumoniae - MIC*    AMPICILLIN RESISTANT Resistant     CEFAZOLIN <=4 SENSITIVE Sensitive     CEFEPIME <=0.12 SENSITIVE Sensitive     CEFTRIAXONE <=0.25 SENSITIVE Sensitive     CIPROFLOXACIN <=0.25 SENSITIVE Sensitive     GENTAMICIN <=1 SENSITIVE Sensitive     IMIPENEM <=0.25 SENSITIVE Sensitive     NITROFURANTOIN <=16 SENSITIVE Sensitive     TRIMETH/SULFA <=20 SENSITIVE Sensitive     AMPICILLIN/SULBACTAM 4 SENSITIVE Sensitive      PIP/TAZO <=4 SENSITIVE Sensitive     * >=100,000 COLONIES/mL KLEBSIELLA PNEUMONIAE    []  Patient discharged originally without antimicrobial agent   New antibiotic prescription:    ED Provider:    08/05/20:   pt at [facility] ER 08/01/20 for foley leaking and had foley replaced.  Urine cx: Kleb.pneumo and E.coli.  Afebrile, no WBC ordered in ER.  pt with neurogenic bladder/ multiple sclerosis.  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  Mizraim Harmening A 08/05/2020, 11:38 AM Clinical Pharmacist

## 2020-08-11 ENCOUNTER — Other Ambulatory Visit: Payer: Self-pay | Admitting: Gastroenterology

## 2020-08-13 ENCOUNTER — Ambulatory Visit (INDEPENDENT_AMBULATORY_CARE_PROVIDER_SITE_OTHER): Payer: Medicare Other

## 2020-08-13 DIAGNOSIS — Z0000 Encounter for general adult medical examination without abnormal findings: Secondary | ICD-10-CM

## 2020-08-13 NOTE — Progress Notes (Signed)
Subjective:   Rebecca [name] is a 60 y.o. female who presents for Medicare Annual (Subsequent) preventive examination.  Virtual Visit via Telephone Note  I connected with  Rebecca [name] on 08/13/20 at  2:10 PM EDT by telephone and verified that I am speaking with the correct person using two identifiers.  Location: Patient: home Provider: [place] Persons participating in the virtual visit: [place]   I discussed the limitations, risks, security and privacy concerns of performing an evaluation and management service by telephone and the availability of in person appointments. The patient expressed understanding and agreed to proceed.  Interactive audio and video telecommunications were attempted between this nurse and patient, however failed, due to patient having technical difficulties OR patient did not have access to video capability.  We continued and completed visit with audio only.  Some vital signs may be absent or patient reported.   Clemetine Marker, LPN   Review of Systems     Cardiac Risk Factors include: advanced age (>37men, >71 women);sedentary lifestyle;obesity (BMI >30kg/m2)     Objective:    There were no vitals filed for this visit. There is no height or weight on file to calculate BMI.  Advanced Directives 08/13/2020 08/01/2020 07/16/2020 01/03/2020 06/05/2019 05/22/2019 05/02/2019  Does Patient Have a Medical Advance Directive? No No No No No No No  Does patient want to make changes to medical advance directive? - - - - - - -  Copy of [place] in Chart? - - - - - - -  Would patient like information on creating a medical advance directive? Yes (MAU/Ambulatory/Procedural Areas - Information given) No - Patient declined - - No - Patient declined - No - Patient declined  Pre-existing out of facility DNR order (yellow form or pink MOST form) - - - - - - -    Current Medications (verified) Outpatient Encounter Medications as of  08/13/2020  Medication Sig   amantadine (SYMMETREL) 100 MG capsule Take 1 capsule (100 mg total) by mouth 3 (three) times daily.   baclofen (LIORESAL) 10 MG tablet TAKE 1 TO 2 TABLETS BY MOUTH 3 TIMES A DAY   Cranberry 1000 MG CAPS Take by mouth.   Disposable Gloves (ASSURANCE VINYL EXAM GLOVES) MISC Neurogenic bladder; MS; LON 99 months; for use for personal hygiene   gluconic acid-citric acid (RENACIDIN) irrigation IRRIGATE WITH 30 MLS AS DIRECTED 3 (THREE) TIMES DAILY.   Glucosamine-Chondroitin (COSAMIN DS PO) Take 1 tablet by mouth 2 (two) times daily.   ibuprofen (ADVIL) 600 MG tablet TAKE 1 TABLET BY MOUTH EVERY 8 HOURS AS NEEDED.   levETIRAcetam (KEPPRA) 500 MG tablet Take 1 tablet (500 mg total) by mouth 2 (two) times daily.   lidocaine-prilocaine (EMLA) cream Apply 1 application topically as needed.   lipase/protease/amylase (CREON) 36000 UNITS CPEP capsule Take 1 capsule (36,000 Units total) by mouth with breakfast, with lunch, and with evening meal AND 1 capsule (36,000 Units total) with snacks.   Misc Natural Products (LEG VEIN & CIRCULATION) TABS Take 1 tablet by mouth 2 (two) times daily.    Multiple Vitamins-Minerals (HAIR SKIN AND NAILS FORMULA) TABS Take 1 tablet by mouth 2 (two) times daily.    Multiple Vitamins-Minerals (MULTIVITAMIN PO) Take 1 tablet by mouth daily.    nystatin (MYCOSTATIN/NYSTOP) powder Apply 1 application topically 3 (three) times daily as needed. For rash/raw skin as needed   omeprazole (PRILOSEC) 40 MG capsule Take 1 capsule (40 mg total) by mouth  daily.   oxybutynin (DITROPAN-XL) 10 MG 24 hr tablet Take 1 tablet (10 mg total) by mouth 2 (two) times daily.   REBIF REBIDOSE 44 MCG/0.5ML SOAJ Inject  44 mcg Sub-q 3 times weekly Rotate site after each injection   tiZANidine (ZANAFLEX) 4 MG tablet TAKE 1 TABLET (4 MG TOTAL) BY MOUTH 2 (TWO) TIMES DAILY.   Turmeric 500 MG CAPS Take 500 mg by mouth daily.   [DISCONTINUED] fluticasone (FLONASE) 50 MCG/ACT nasal  spray SPRAY 2 SPRAYS INTO EACH NOSTRIL EVERY DAY   No facility-administered encounter medications on file as of 08/13/2020.    Allergies (verified) Fentanyl and Sulfa antibiotics   History: Past Medical History:  Diagnosis Date   Allergy    Aortic valve disease    Mild AS / AI - most recent Echo demonstrated tricuspid aortic valve.   Bacterial endocarditis    History of .   Bilateral lower extremity edema    Noncardiac.  Chronic. LE Venous dopplers - negative for DVT.; Echocardiogram January 2016: Normal EF with normal wall motion and valve function. Only grade 1 diastolic dysfunction. EF 60-65%. Mild MR   Breast cancer ([place]) 12/31/2013   Right breast, 12:00, 1.5 cm, T1c,N0 invasive mammary carcinoma, triple negative. --> Rx with Chemo   Cervical stenosis of spine    GERD (gastroesophageal reflux disease) April or May 2022   Heart murmur 2013   Herpes zoster    IBS (irritable bowel syndrome)    Lymphedema    has legs wrapped at [facility]   Multiple sclerosis ([place]) 2001   Walks from room to room @ home; but Wheelchair when going out.   Neuromuscular disorder ([place])    MS   PONV (postoperative nausea and vomiting)    Related to Fentanyl   Seizures ([place])    Takes Keppra   Syncope and collapse    Past Surgical History:  Procedure Laterality Date   ANKLE SURGERY     Left   ANKLE SURGERY     ANTERIOR CERVICAL DECOMP/DISCECTOMY FUSION  11/17/2011   Procedure: ANTERIOR CERVICAL DECOMPRESSION/DISCECTOMY FUSION 2 LEVELS;  Surgeon: Erline Levine, MD;  Location: [facility];  Service: Neurosurgery;  Laterality: N/A;  Cervical Five-Six Six-Seven Anterior cervical decompression/diskectomy/fusion   BREAST BIOPSY Right 12/31/2013   invasive mammary   BREAST SURGERY Right 02/03/2014   Right simple mastectomy with sentinel node biopsy.   CHOLECYSTECTOMY     COLONOSCOPY  2014   ESOPHAGOGASTRODUODENOSCOPY (EGD) WITH PROPOFOL N/A 07/16/2020   Procedure: ESOPHAGOGASTRODUODENOSCOPY (EGD) WITH  PROPOFOL;  Surgeon: Virgel Manifold, MD;  Location: [facility];  Service: Endoscopy;  Laterality: N/A;  Patient has MS and will need assistance   HYSTEROSCOPY WITH D & C N/A 11/27/2018   Procedure: DILATATION AND CURETTAGE /HYSTEROSCOPY;  Surgeon: Gae Dry, MD;  Location: [facility];  Service: Gynecology;  Laterality: N/A;   Lower extremity venous Dopplers  02/27/2013   No LE DVT   MASTECTOMY Right 2015   Port a cath insertion Right 01/19/2010   PORT-A-CATH REMOVAL     right   PORT-A-CATH REMOVAL Right 09/03/2013   Procedure: REMOVAL PORT-A-CATH;  Surgeon: Conrad Taft Southwest, MD;  Location: [facility];  Service: Vascular;  Laterality: Right;   SPINE SURGERY  Cervical steinois Dr. Vertell Limber 2014 I think?   TONSILLECTOMY     TRANSTHORACIC ECHOCARDIOGRAM  03/2013; 02/2014   a) Normal LV size and function with EF 60-65%.; Cannot exclude bicuspid aortic valve with mild AS and mild AI.; b) Normal  EF with normal wall motion and valve function x Mild MR. G2 DD. EF 60-65%. Tricuspid AoV   UPPER GI ENDOSCOPY  2014   Family History  Problem Relation Age of Onset   Cancer Father        skin   Heart disease Father    Heart attack Father        heart attack in his 70's   Thyroid disease Sister    Ovarian cancer Cousin    Breast cancer Maternal Aunt 60   Breast cancer Maternal Grandmother 74   Bladder Cancer Neg Hx    Kidney cancer Neg Hx    Social History   Socioeconomic History   Marital status: Married    Spouse name: don   Number of children: 0   Years of education: 12   Highest education level: Not on file  Occupational History   Occupation: disability  Tobacco Use   Smoking status: Never   Smokeless tobacco: Never   Tobacco comments:    Never smoked. My mom smoked. Passed 04/18/20. Dad smoked, quit 1973. Sister smokes.  Vaping Use   Vaping Use: Never used  Substance and Sexual Activity   Alcohol use: No   Drug use: No   Sexual activity: Not Currently    Partners: Male     Birth control/protection: None  Other Topics Concern   Not on file  Social History Narrative   She is married. Recently moved back to [place] after being in [place] for some time. She is accompanied by her husband and aunt.   Never smoked. Never used alcohol.   Social Determinants of Health   Financial Resource Strain: Low Risk    Difficulty of Paying Living Expenses: Not hard at all  Food Insecurity: No Food Insecurity   Worried About Charity fundraiser in the Last Year: Never true   [place] in the Last Year: Never true  Transportation Needs: No Transportation Needs   Lack of Transportation (Medical): No   Lack of Transportation (Non-Medical): No  Physical Activity: Inactive   Days of Exercise per Week: 0 days   Minutes of Exercise per Session: 0 min  Stress: No Stress Concern Present   Feeling of Stress : Not at all  Social Connections: Moderately Isolated   Frequency of Communication with Friends and Family: More than three times a week   Frequency of Social Gatherings with Friends and Family: Once a week   Attends Religious Services: Never   Marine scientist or Organizations: No   Attends Archivist Meetings: Never   Marital Status: Married    Tobacco Counseling Counseling given: Not Answered Tobacco comments: Never smoked. My mom smoked. Passed 04/18/20. Dad smoked, quit 1973. Sister smokes.   Clinical Intake:  Pre-visit preparation completed: Yes  Pain : No/denies pain     Nutritional Risks: None Diabetes: No  How often do you need to have someone help you when you read instructions, pamphlets, or other written materials from your doctor or pharmacy?: 1 - Never    Interpreter Needed?: No  Information entered by :: Clemetine Marker LPN   Activities of Daily Living In your present state of health, do you have any difficulty performing the following activities: 08/13/2020 04/14/2020  Hearing? N N  Comment declines hearing aids -   Vision? N Y  Difficulty concentrating or making decisions? N N  Walking or climbing stairs? Y Y  Dressing or bathing? Tempie Donning  Doing errands, shopping? Tempie Donning  Preparing Food and eating ? Y -  Using the Toilet? Y -  In the past six months, have you accidently leaked urine? Y -  Do you have problems with loss of bowel control? N -  Managing your Medications? N -  Managing your Finances? N -  Housekeeping or managing your Housekeeping? N -  Some recent data might be hidden    Patient Care Team: Delsa Grana, PA-C as PCP - General (Family Medicine) Bobetta Lime, MD as Referring Physician Byrnett, Forest Gleason, MD (General Surgery) Penni Bombard, MD as Consulting Physician (Neurology) Lloyd Huger, MD as Consulting Physician (Oncology) Leonie Man, MD as Consulting Physician (Cardiology) Minna Merritts, MD as Consulting Physician (Cardiology) Laneta Simmers as Physician Assistant (Urology) Cathi Roan, [facility] (Inactive) (Pharmacist)  Indicate any recent Medical Services you may have received from other than [facility] providers in the past year (date may be approximate).     Assessment:   This is a routine wellness examination for [place].  Hearing/Vision screen Hearing Screening - Comments:: Pt denies hearing difficulty Vision Screening - Comments:: Annual vision screenings with [facility]  Dietary issues and exercise activities discussed: Current Exercise Habits: The patient does not participate in regular exercise at present, Exercise limited by: neurologic condition(s);orthopedic condition(s)   Goals Addressed   None    Depression Screen PHQ 2/9 Scores 08/13/2020 04/14/2020 02/03/2020 08/13/2019 07/31/2019 01/18/2019 10/22/2018  PHQ - 2 Score 0 0 0 0 0 0 0  PHQ- 9 Score - - - - 0 0 0    Fall Risk Fall Risk  08/13/2020 04/14/2020 02/03/2020 12/09/2019 08/13/2019  Falls in the past year? 1 1 0 0 1  Number falls in past yr: 1 0 0 - 0  Injury with Fall?  0 1 1 - 0  Risk Factor Category  - - - - -  Risk for fall due to : History of fall(s);Impaired balance/gait;Impaired mobility - - - Impaired balance/gait;Impaired mobility;History of fall(s)  Risk for fall due to: Comment - - - - -  Follow up Falls prevention discussed - Falls evaluation completed - Falls prevention discussed  Comment - - - - -    FALL RISK PREVENTION PERTAINING TO THE HOME:  Any stairs in or around the home? No  If so, are there any without handrails? No  Home free of loose throw rugs in walkways, pet beds, electrical cords, etc? Yes  Adequate lighting in your home to reduce risk of falls? Yes   ASSISTIVE DEVICES UTILIZED TO PREVENT FALLS:  Life alert? Yes  Use of a cane, walker or w/c? Yes  Grab bars in the bathroom? Yes  Shower chair or bench in shower? Yes  Elevated toilet seat or a handicapped toilet? Yes   TIMED UP AND GO:  Was the test performed? No . Telephonic visit.    Cognitive Function: Normal cognitive status assessed by direct observation by this Nurse Health Advisor. No abnormalities found.          Immunizations Immunization History  Administered Date(s) Administered   Influenza,inj,Quad PF,6+ Mos 10/24/2014, 11/25/2015, 10/28/2016, 12/26/2017, 11/20/2018   Moderna Sars-Covid-2 Vaccination 07/02/2019, 07/31/2019   Pneumococcal Polysaccharide-23 02/03/2018   Tdap 04/09/2012    TDAP status: Up to date  Flu vaccine status: due fall 2022  Pneumococcal vaccine status: Due, Education has been provided regarding the importance of this vaccine. Advised may receive this vaccine at local pharmacy or Health  Dept. Aware to provide a copy of the vaccination record if obtained from local pharmacy or Health Dept. Verbalized acceptance and understanding.  Covid-19 vaccine status: Completed vaccines  Qualifies for Shingles Vaccine? Yes   Zostavax completed No   Shingrix Completed?: No.    Education has been provided regarding the importance of this  vaccine. Patient has been advised to call insurance company to determine out of pocket expense if they have not yet received this vaccine. Advised may also receive vaccine at local pharmacy or Health Dept. Verbalized acceptance and understanding.  Screening Tests Health Maintenance  Topic Date Due   Pneumococcal Vaccine 46-21 Years old (1 - PCV) Never done   Zoster Vaccines- Shingrix (1 of 2) Never done   COVID-19 Vaccine (3 - Moderna risk series) 08/28/2019   MAMMOGRAM  09/06/2019   INFLUENZA VACCINE  09/07/2020   PAP SMEAR-Modifier  09/25/2021   COLONOSCOPY (Pts 45-51yrs Insurance coverage will need to be confirmed)  02/07/2022   TETANUS/TDAP  04/10/2022   Hepatitis C Screening  Completed   HIV Screening  Completed   HPV VACCINES  Aged Out    Health Maintenance  Health Maintenance Due  Topic Date Due   Pneumococcal Vaccine 59-29 Years old (1 - PCV) Never done   Zoster Vaccines- Shingrix (1 of 2) Never done   COVID-19 Vaccine (3 - Moderna risk series) 08/28/2019   MAMMOGRAM  09/06/2019    Colorectal cancer screening: Type of screening: Colonoscopy. Completed 09/30/12. Repeat every 10 years  Mammogram status: Completed 09/06/18. Repeat every year. Scheduled for 09/09/20  Bone density status: due age 38  Lung Cancer Screening: (Low Dose CT Chest recommended if Age 35-80 years, 30 pack-year currently smoking OR have quit w/in 15years.) does not qualify.  Additional Screening:  Hepatitis C Screening: does qualify; Completed 06/27/16  Vision Screening: Recommended annual ophthalmology exams for early detection of glaucoma and other disorders of the eye. Is the patient up to date with their annual eye exam?  Yes  Who is the provider or what is the name of the office in which the patient attends annual eye exams? [facility].   Dental Screening: Recommended annual dental exams for proper oral hygiene  Community Resource Referral / Chronic Care Management: CRR required this  visit?  No   CCM required this visit?  No      Plan:     I have personally reviewed and noted the following in the patient's chart:   Medical and social history Use of alcohol, tobacco or illicit drugs  Current medications and supplements including opioid prescriptions.  Functional ability and status Nutritional status Physical activity Advanced directives List of other physicians Hospitalizations, surgeries, and ER visits in previous 12 months Vitals Screenings to include cognitive, depression, and falls Referrals and appointments  In addition, I have reviewed and discussed with patient certain preventive protocols, quality metrics, and best practice recommendations. A written personalized care plan for preventive services as well as general preventive health recommendations were provided to patient.     Clemetine Marker, LPN   07/15/3417   Nurse Notes:patient states she has been released from physical therapy with [facility] but interested in resuming physical therapy to assist with strength and gait exercises and fall prevention.

## 2020-08-13 NOTE — Patient Instructions (Signed)
[name] , Thank you for taking time to come for your Medicare Wellness Visit. I appreciate your ongoing commitment to your health goals. Please review the following plan we discussed and let me know if I can assist you in the future.   Screening recommendations/referrals: Colonoscopy: done 09/30/12. Repeat in 2024 or as directed by GI.  Mammogram: done 09/06/18. Scheduled for 09/09/20 Bone Density: due age 60 Recommended yearly ophthalmology/optometry visit for glaucoma screening and checkup Recommended yearly dental visit for hygiene and checkup  Vaccinations: Influenza vaccine: due fall 2022 Pneumococcal vaccine: done 02/03/18 Tdap vaccine: done 04/09/12 Shingles vaccine: Shingrix discussed. Please contact your pharmacy for coverage information.   Covid-19: done 07/02/19 & 07/31/19  Advanced directives: Advance directive discussed with you today. I have provided a copy for you to complete at home and have notarized. Once this is complete please bring a copy in to our office so we can scan it into your chart.   Conditions/risks identified: Recommend increasing physical activity for strength and gait training.   Next appointment: Follow up in one year for your annual wellness visit.   Preventive Care 40-64 Years, Female Preventive care refers to lifestyle choices and visits with your health care provider that can promote health and wellness. What does preventive care include? A yearly physical exam. This is also called an annual well check. Dental exams once or twice a year. Routine eye exams. Ask your health care provider how often you should have your eyes checked. Personal lifestyle choices, including: Daily care of your teeth and gums. Regular physical activity. Eating a healthy diet. Avoiding tobacco and drug use. Limiting alcohol use. Practicing safe sex. Taking low-dose aspirin daily starting at age 2. Taking vitamin and mineral supplements as recommended by your health care  provider. What happens during an annual well check? The services and screenings done by your health care provider during your annual well check will depend on your age, overall health, lifestyle risk factors, and family history of disease. Counseling  Your health care provider may ask you questions about your: Alcohol use. Tobacco use. Drug use. Emotional well-being. Home and relationship well-being. Sexual activity. Eating habits. Work and work Statistician. Method of birth control. Menstrual cycle. Pregnancy history. Screening  You may have the following tests or measurements: Height, weight, and BMI. Blood pressure. Lipid and cholesterol levels. These may be checked every 5 years, or more frequently if you are over 35 years old. Skin check. Lung cancer screening. You may have this screening every year starting at age 34 if you have a 30-pack-year history of smoking and currently smoke or have quit within the past 15 years. Fecal occult blood test (FOBT) of the stool. You may have this test every year starting at age 38. Flexible sigmoidoscopy or colonoscopy. You may have a sigmoidoscopy every 5 years or a colonoscopy every 10 years starting at age 32. Hepatitis C blood test. Hepatitis B blood test. Sexually transmitted disease (STD) testing. Diabetes screening. This is done by checking your blood sugar (glucose) after you have not eaten for a while (fasting). You may have this done every 1-3 years. Mammogram. This may be done every 1-2 years. Talk to your health care provider about when you should start having regular mammograms. This may depend on whether you have a family history of breast cancer. BRCA-related cancer screening. This may be done if you have a family history of breast, ovarian, tubal, or peritoneal cancers. Pelvic exam and Pap test. This may be  done every 3 years starting at age 67. Starting at age 32, this may be done every 5 years if you have a Pap test in  combination with an HPV test. Bone density scan. This is done to screen for osteoporosis. You may have this scan if you are at high risk for osteoporosis. Discuss your test results, treatment options, and if necessary, the need for more tests with your health care provider. Vaccines  Your health care provider may recommend certain vaccines, such as: Influenza vaccine. This is recommended every year. Tetanus, diphtheria, and acellular pertussis (Tdap, Td) vaccine. You may need a Td booster every 10 years. Zoster vaccine. You may need this after age 30. Pneumococcal 13-valent conjugate (PCV13) vaccine. You may need this if you have certain conditions and were not previously vaccinated. Pneumococcal polysaccharide (PPSV23) vaccine. You may need one or two doses if you smoke cigarettes or if you have certain conditions. Talk to your health care provider about which screenings and vaccines you need and how often you need them. This information is not intended to replace advice given to you by your health care provider. Make sure you discuss any questions you have with your health care provider. Document Released: 02/20/2015 Document Revised: 10/14/2015 Document Reviewed: 11/25/2014 Elsevier Interactive Patient Education  2017 [employer] Prevention in the Home Falls can cause injuries. They can happen to people of all ages. There are many things you can do to make your home safe and to help prevent falls. What can I do on the outside of my home? Regularly fix the edges of walkways and driveways and fix any cracks. Remove anything that might make you trip as you walk through a door, such as a raised step or threshold. Trim any bushes or trees on the path to your home. Use bright outdoor lighting. Clear any walking paths of anything that might make someone trip, such as rocks or tools. Regularly check to see if handrails are loose or broken. Make sure that both sides of any steps have  handrails. Any raised decks and porches should have guardrails on the edges. Have any leaves, snow, or ice cleared regularly. Use sand or salt on walking paths during winter. Clean up any spills in your garage right away. This includes oil or grease spills. What can I do in the bathroom? Use night lights. Install grab bars by the toilet and in the tub and shower. Do not use towel bars as grab bars. Use non-skid mats or decals in the tub or shower. If you need to sit down in the shower, use a plastic, non-slip stool. Keep the floor dry. Clean up any water that spills on the floor as soon as it happens. Remove soap buildup in the tub or shower regularly. Attach bath mats securely with double-sided non-slip rug tape. Do not have throw rugs and other things on the floor that can make you trip. What can I do in the bedroom? Use night lights. Make sure that you have a light by your bed that is easy to reach. Do not use any sheets or blankets that are too big for your bed. They should not hang down onto the floor. Have a firm chair that has side arms. You can use this for support while you get dressed. Do not have throw rugs and other things on the floor that can make you trip. What can I do in the kitchen? Clean up any spills right away. Avoid walking  on wet floors. Keep items that you use a lot in easy-to-reach places. If you need to reach something above you, use a strong step stool that has a grab bar. Keep electrical cords out of the way. Do not use floor polish or wax that makes floors slippery. If you must use wax, use non-skid floor wax. Do not have throw rugs and other things on the floor that can make you trip. What can I do with my stairs? Do not leave any items on the stairs. Make sure that there are handrails on both sides of the stairs and use them. Fix handrails that are broken or loose. Make sure that handrails are as long as the stairways. Check any carpeting to make sure  that it is firmly attached to the stairs. Fix any carpet that is loose or worn. Avoid having throw rugs at the top or bottom of the stairs. If you do have throw rugs, attach them to the floor with carpet tape. Make sure that you have a light switch at the top of the stairs and the bottom of the stairs. If you do not have them, ask someone to add them for you. What else can I do to help prevent falls? Wear shoes that: Do not have high heels. Have rubber bottoms. Are comfortable and fit you well. Are closed at the toe. Do not wear sandals. If you use a stepladder: Make sure that it is fully opened. Do not climb a closed stepladder. Make sure that both sides of the stepladder are locked into place. Ask someone to hold it for you, if possible. Clearly mark and make sure that you can see: Any grab bars or handrails. First and last steps. Where the edge of each step is. Use tools that help you move around (mobility aids) if they are needed. These include: Canes. Walkers. Scooters. Crutches. Turn on the lights when you go into a dark area. Replace any light bulbs as soon as they burn out. Set up your furniture so you have a clear path. Avoid moving your furniture around. If any of your floors are uneven, fix them. If there are any pets around you, be aware of where they are. Review your medicines with your doctor. Some medicines can make you feel dizzy. This can increase your chance of falling. Ask your doctor what other things that you can do to help prevent falls. This information is not intended to replace advice given to you by your health care provider. Make sure you discuss any questions you have with your health care provider. Document Released: 11/20/2008 Document Revised: 07/02/2015 Document Reviewed: 02/28/2014 Elsevier Interactive Patient Education  2017 [employer].

## 2020-08-14 ENCOUNTER — Inpatient Hospital Stay: Payer: Medicare Other

## 2020-08-17 ENCOUNTER — Inpatient Hospital Stay: Payer: Medicare Other | Attending: Oncology

## 2020-08-17 DIAGNOSIS — Z171 Estrogen receptor negative status [ER-]: Secondary | ICD-10-CM | POA: Diagnosis not present

## 2020-08-17 DIAGNOSIS — R1310 Dysphagia, unspecified: Secondary | ICD-10-CM | POA: Insufficient documentation

## 2020-08-17 DIAGNOSIS — I890 Lymphedema, not elsewhere classified: Secondary | ICD-10-CM | POA: Insufficient documentation

## 2020-08-17 DIAGNOSIS — R600 Localized edema: Secondary | ICD-10-CM | POA: Diagnosis not present

## 2020-08-17 DIAGNOSIS — Z95828 Presence of other vascular implants and grafts: Secondary | ICD-10-CM

## 2020-08-17 DIAGNOSIS — G35 Multiple sclerosis: Secondary | ICD-10-CM | POA: Diagnosis not present

## 2020-08-17 DIAGNOSIS — C50911 Malignant neoplasm of unspecified site of right female breast: Secondary | ICD-10-CM | POA: Diagnosis present

## 2020-08-17 DIAGNOSIS — Z452 Encounter for adjustment and management of vascular access device: Secondary | ICD-10-CM | POA: Insufficient documentation

## 2020-08-17 DIAGNOSIS — Z79899 Other long term (current) drug therapy: Secondary | ICD-10-CM | POA: Insufficient documentation

## 2020-08-17 MED ORDER — SODIUM CHLORIDE 0.9% FLUSH
10.0000 mL | Freq: Once | INTRAVENOUS | Status: AC
  Administered 2020-08-17: 10 mL via INTRAVENOUS
  Filled 2020-08-17: qty 10

## 2020-08-17 MED ORDER — HEPARIN SOD (PORK) LOCK FLUSH 100 UNIT/ML IV SOLN
500.0000 [IU] | Freq: Once | INTRAVENOUS | Status: AC
Start: 2020-08-17 — End: 2020-08-17
  Administered 2020-08-17: 500 [IU] via INTRAVENOUS
  Filled 2020-08-17: qty 5

## 2020-08-17 MED ORDER — HEPARIN SOD (PORK) LOCK FLUSH 100 UNIT/ML IV SOLN
INTRAVENOUS | Status: AC
  Filled 2020-08-17: qty 5

## 2020-08-21 ENCOUNTER — Ambulatory Visit: Payer: Self-pay | Admitting: *Deleted

## 2020-08-21 NOTE — Telephone Encounter (Signed)
Patient calls with an open area under the left buttocks that bleeds at times as he is in a lift chair full time and does have friction and pressure to the area on occasion. Went to [facility] today. Wound was cleaned with antiseptic spray and given cephalexin due to an odor present. Told to have aide clean and dry area twice daily until her scheduled appointment next week at the wound clinic. Advised to keep pressure off as much as possible and continue daily cleanings, take all antibiotic given. If any fever or pus-like drainage call back. Reason for Disposition . [1] Minor skin tear AND [2] from accidental self-injury (e.g., bumping into objects) AND [3] recurrent problem  Answer Assessment - Initial Assessment Questions 1. DESCRIPTION: "What does the injury look like?"  "Is there any part of the skin missing?"  If yes, "How much of the skin flap is missing?"  (25%, 50%, 75%, all)   Area under the fold of the left buttocks is open and bleeds at times 2. SIZE: "How large is the skin tear?"     Unsure it is skin tear for sure. Unsure of size 3. BLEEDING: "Is it bleeding now?" If Yes, ask: "Is it difficult to stop?"     Not at this time 4. LOCATION: "Where is the skin tear located?"     Under left buttocks 5. ONSET: "How long ago did the skin tear occur?"     One-two weeks ago 6. MECHANISM: "Tell me how it happened."     Unsure. She sits and sleeps in a lift chair. Could be a friction or pressure sore tear.  Protocols used: Skin Tear-A-AH

## 2020-08-26 ENCOUNTER — Encounter: Payer: Medicare Other | Attending: Internal Medicine | Admitting: Internal Medicine

## 2020-08-26 ENCOUNTER — Other Ambulatory Visit: Payer: Self-pay

## 2020-08-26 DIAGNOSIS — X58XXXA Exposure to other specified factors, initial encounter: Secondary | ICD-10-CM | POA: Insufficient documentation

## 2020-08-26 DIAGNOSIS — Z882 Allergy status to sulfonamides status: Secondary | ICD-10-CM | POA: Insufficient documentation

## 2020-08-26 DIAGNOSIS — Z853 Personal history of malignant neoplasm of breast: Secondary | ICD-10-CM | POA: Diagnosis not present

## 2020-08-26 DIAGNOSIS — S31829A Unspecified open wound of left buttock, initial encounter: Secondary | ICD-10-CM

## 2020-08-26 DIAGNOSIS — G4089 Other seizures: Secondary | ICD-10-CM | POA: Insufficient documentation

## 2020-08-26 DIAGNOSIS — G35 Multiple sclerosis: Secondary | ICD-10-CM | POA: Insufficient documentation

## 2020-08-26 DIAGNOSIS — Z885 Allergy status to narcotic agent status: Secondary | ICD-10-CM | POA: Diagnosis not present

## 2020-08-27 NOTE — Progress Notes (Signed)
CHRISTON, GALLAWAY ([id number]) Visit Report for 08/26/2020 Chief Complaint Document Details Patient Name: Rebecca [name], Rebecca [name]. Date of Service: 08/26/2020 12:45 PM Medical Record Number: [id number] Patient Account Number: [id number] Date of Birth/Sex: 1960/02/20 (60 y.o. F) Treating RN: Donnamarie Poag Primary Care Provider: Delsa Grana Other Clinician: Referring Provider: Referral, Self Treating Provider/Extender: Yaakov Guthrie in Treatment: 0 Information Obtained from: Patient Chief Complaint Left Gluteus pressure ulcer Electronic Signature(s) Signed: 08/26/2020 3:27:35 PM By: Kalman Shan DO Entered By: Kalman Shan on 08/26/2020 15:15:09 Rebecca [name] ([id number]) -------------------------------------------------------------------------------- Debridement Details Patient Name: Rebecca [name]. Date of Service: 08/26/2020 12:45 PM Medical Record Number: [id number] Patient Account Number: [id number] Date of Birth/Sex: April 12, 1960 (60 y.o. F) Treating RN: Donnamarie Poag Primary Care Provider: Delsa Grana Other Clinician: Referring Provider: Referral, Self Treating Provider/Extender: Yaakov Guthrie in Treatment: 0 Debridement Performed for Wound #3 Left Gluteal fold Assessment: Performed By: Physician Kalman Shan, MD Debridement Type: Chemical/Enzymatic/Mechanical Agent Used: gauze and saline Level of Consciousness (Pre- Awake and Alert procedure): Pre-procedure Verification/Time Out Yes - 13:55 Taken: Start Time: 13:55 Pain Control: Lidocaine Instrument: Other : saline and gauze Bleeding: Minimum Hemostasis Achieved: Pressure End Time: 13:56 Response to Treatment: Procedure was tolerated well Level of Consciousness (Post- Awake and Alert procedure): Post Debridement Measurements of Total Wound Length: (cm) 2 Stage: Category/Stage II Width: (cm) 0.5 Depth: (cm) 0.1 Volume: (cm) 0.079 Character of Wound/Ulcer Post Debridement: Improved Post  Procedure Diagnosis Same as Pre-procedure Electronic Signature(s) Signed: 08/26/2020 3:27:35 PM By: Kalman Shan DO Signed: 08/27/2020 10:03:03 AM By: Donnamarie Poag Entered By: Donnamarie Poag on 08/26/2020 13:55:53 Rebecca [name] ([id number]) -------------------------------------------------------------------------------- HPI Details Patient Name: Rebecca [name]. Date of Service: 08/26/2020 12:45 PM Medical Record Number: [id number] Patient Account Number: [id number] Date of Birth/Sex: [birth year] (60 y.o. F) Treating RN: Donnamarie Poag Primary Care Provider: Delsa Grana Other Clinician: Referring Provider: Referral, Self Treating Provider/Extender: Yaakov Guthrie in Treatment: 0 History of Present Illness HPI Description: Admission 7/20 [name] is a 60 year old female with a past medical history of multiple sclerosis, lymphedema and right breast cancer that presents to the clinic today for a wound to her left gluteus. She states this started 3 weeks ago and she has been using a wound cleanser on this and keeping it covered. She was concerned about the depth and bleeding to the site and visited the ED. She was started on Keflex. She is currently taking the antibiotic course. Per her husband the wound has shown improvement in the past week. She currently resides in a facility. She denies signs of infection. Electronic Signature(s) Signed: 08/26/2020 3:27:35 PM By: Kalman Shan DO Entered By: Kalman Shan on 08/26/2020 15:23:33 Rebecca [name] ([id number]) -------------------------------------------------------------------------------- Physical Exam Details Patient Name: Rebecca [name], Rebecca [name]. Date of Service: 08/26/2020 12:45 PM Medical Record Number: [id number] Patient Account Number: [id number] Date of Birth/Sex: 04-04-1960 (60 y.o. F) Treating RN: Donnamarie Poag Primary Care Provider: Delsa Grana Other Clinician: Referring Provider: Referral, Self Treating  Provider/Extender: Yaakov Guthrie in Treatment: 0 Constitutional . Psychiatric . Notes Left gluteus: To the most distal portion there is an open wound that has healthy granulation tissue present. No signs of infection. Electronic Signature(s) Signed: 08/26/2020 3:27:35 PM By: Kalman Shan DO Entered By: Kalman Shan on 08/26/2020 15:24:06 Rebecca [name] ([id number]) -------------------------------------------------------------------------------- Physician Orders Details Patient Name: Rebecca [name]. Date of Service: 08/26/2020 12:45 PM Medical Record Number: [id number] Patient Account Number: [id number] Date of Birth/Sex: [birth year] (60 y.o. F) Treating RN: Donnamarie Poag  Primary Care Provider: Delsa Grana Other Clinician: Referring Provider: Referral, Self Treating Provider/Extender: Yaakov Guthrie in Treatment: 0 Verbal / Phone Orders: No Diagnosis Coding Follow-up Appointments o Return Appointment in 2 weeks. Home Health Wound #3 Left Gluteal fold o [place]: - [facility] for wound care. May utilize formulary equivalent dressing for wound treatment orders unless otherwise specified. Home Health Nurse may visit PRN to address patientos wound care needs. Bathing/ Shower/ Hygiene o May shower; gently cleanse wound with antibacterial soap, rinse and pat dry prior to dressing wounds - make sure dressing is dry after bathing Off-Loading o Gel wheelchair cushion o Gel mattress overlay (Group 1) o Turn and reposition every 2 hours Wound Treatment Wound #3 - Gluteal fold Wound Laterality: Left Cleanser: Soap and Water 1 x Per Day/30 Days Discharge Instructions: Gently cleanse wound with antibacterial soap, rinse and pat dry prior to dressing wounds Primary Dressing: Hydrofera Blue Ready Transfer Foam, 2.5x2.5 (in/in) (Home Health) 1 x Per Day/30 Days Discharge Instructions: Apply Hydrofera Blue Ready to wound bed  as directed Secondary Dressing: [place] Dressing, 4x4 (in/in) (Home Health) 1 x Per Day/30 Days Discharge Instructions: Apply over dressing to secure in place. Electronic Signature(s) Signed: 08/26/2020 3:27:35 PM By: Kalman Shan DO Signed: 08/27/2020 10:03:03 AM By: Donnamarie Poag Entered By: Donnamarie Poag on 08/26/2020 14:04:00 Rebecca [name] ([id number]) -------------------------------------------------------------------------------- Problem List Details Patient Name: RAIYA, STAINBACK. Date of Service: 08/26/2020 12:45 PM Medical Record Number: [id number] Patient Account Number: [id number] Date of Birth/Sex: 03/10/1960 (60 y.o. F) Treating RN: Donnamarie Poag Primary Care Provider: Delsa Grana Other Clinician: Referring Provider: Referral, Self Treating Provider/Extender: Yaakov Guthrie in Treatment: 0 Active Problems ICD-10 Encounter Code Description Active Date MDM Diagnosis S31.829A Unspecified open wound of left buttock, initial encounter 08/26/2020 No Yes G35 Multiple sclerosis 08/26/2020 No Yes G40.89 Other seizures 08/26/2020 No Yes Inactive Problems Resolved Problems Electronic Signature(s) Signed: 08/26/2020 3:27:35 PM By: Kalman Shan DO Entered By: Kalman Shan on 08/26/2020 14:30:51 Rebecca [name]. ([id number]) -------------------------------------------------------------------------------- Progress Note Details Patient Name: Rebecca [name]. Date of Service: 08/26/2020 12:45 PM Medical Record Number: [id number] Patient Account Number: [id number] Date of Birth/Sex: [birth year] (60 y.o. F) Treating RN: Donnamarie Poag Primary Care Provider: Delsa Grana Other Clinician: Referring Provider: Referral, Self Treating Provider/Extender: Yaakov Guthrie in Treatment: 0 Subjective Chief Complaint Information obtained from Patient Left Gluteus pressure ulcer History of Present Illness (HPI) Admission 7/20 Ms. Neita Landrigan is a 60 year old female  with a past medical history of multiple sclerosis, lymphedema and right breast cancer that presents to the clinic today for a wound to her left gluteus. She states this started 3 weeks ago and she has been using a wound cleanser on this and keeping it covered. She was concerned about the depth and bleeding to the site and visited the ED. She was started on Keflex. She is currently taking the antibiotic course. Per her husband the wound has shown improvement in the past week. She currently resides in a facility. She denies signs of infection. Patient History Information obtained from Patient. Allergies fentanyl (Severity: Moderate, Reaction: nausea), Sulfa (Sulfonamide Antibiotics) (Severity: Moderate) Family History Cancer - Mother,Father, Diabetes - Mother, Heart Disease - Father,Mother, Hypertension - Siblings, Stroke - Mother,Father, Thyroid Problems - Siblings, No family history of Hereditary Spherocytosis, Kidney Disease, Lung Disease, Seizures, Tuberculosis. Social History Never smoker, Marital Status - Married, Alcohol Use - Never, Drug Use - No History, Caffeine Use - Daily -  coffee. Medical History Eyes Denies history of Cataracts, Glaucoma, Optic Neuritis Ear/Nose/Mouth/Throat Denies history of Chronic sinus problems/congestion, Middle ear problems Hematologic/Lymphatic Denies history of Anemia, Hemophilia, Human Immunodeficiency Virus, Lymphedema, Sickle Cell Disease Respiratory Denies history of Aspiration, Asthma, Chronic Obstructive Pulmonary Disease (COPD), Pneumothorax, Sleep Apnea, Tuberculosis Cardiovascular Patient has history of Arrhythmia Denies history of Angina, Congestive Heart Failure, Coronary Artery Disease, Deep Vein Thrombosis, Hypertension, Hypotension, Myocardial Infarction, Peripheral Arterial Disease, Peripheral Venous Disease, Phlebitis, Vasculitis Gastrointestinal Denies history of Cirrhosis , Colitis, Crohn s, Hepatitis A, Hepatitis B, Hepatitis  C Endocrine Denies history of Type I Diabetes, Type II Diabetes Genitourinary Denies history of End Stage Renal Disease Immunological Denies history of Lupus Erythematosus, Raynaud s, Scleroderma Integumentary (Skin) Patient has history of History of pressure wounds Denies history of History of Burn Musculoskeletal Denies history of Gout, Rheumatoid Arthritis, Osteoarthritis, Osteomyelitis Neurologic Patient has history of Seizure Disorder - on kepra Denies history of Dementia, Neuropathy, Quadriplegia, Paraplegia Oncologic Patient has history of Received Chemotherapy - breast cancer Denies history of Received Radiation Psychiatric Denies history of Anorexia/bulimia, Confinement Anxiety Hospitalization/Surgery History - colonsocopy. - cholecytectomy. - right mastectomy. Rebecca [name], Rebecca [name] ([id number]) Medical And Surgical History Notes Cardiovascular aortic valve disease, bacterial endocarditis, heart murmur Gastrointestinal Irritable bowel syndrome Neurologic Multiple Sclerosis Oncologic breast cancer Review of Systems (ROS) Constitutional Symptoms (General Health) Denies complaints or symptoms of Fatigue, Fever, Chills, Marked Weight Change. Eyes Complains or has symptoms of Glasses / Contacts. Ear/Nose/Mouth/Throat Denies complaints or symptoms of Difficult clearing ears, Sinusitis. Hematologic/Lymphatic Denies complaints or symptoms of Bleeding / Clotting Disorders, Human Immunodeficiency Virus. Respiratory Denies complaints or symptoms of Chronic or frequent coughs, Shortness of Breath. Cardiovascular Complains or has symptoms of LE edema. Gastrointestinal Denies complaints or symptoms of Frequent diarrhea, Nausea, Vomiting. Endocrine Denies complaints or symptoms of Hepatitis, Thyroid disease, Polydypsia (Excessive Thirst). Genitourinary indwelling cath #16 french foley changed monthly considering SP cath Immunological Denies complaints or symptoms of Hives,  Itching. Integumentary (Skin) Complains or has symptoms of Wounds, Breakdown, Swelling. Musculoskeletal Complains or has symptoms of Muscle Weakness. Neurologic MS dx 2001 Psychiatric Denies complaints or symptoms of Anxiety, Claustrophobia. Objective Constitutional Vitals Time Taken: 1:09 PM, Height: 64 in, Source: Stated, Weight: 214 lbs, Source: Stated, BMI: 36.7, Temperature: 97.9 F, Pulse: 76 bpm, Respiratory Rate: 16 breaths/min, Blood Pressure: 163/81 mmHg. General Notes: Left gluteus: To the most distal portion there is an open wound that has healthy granulation tissue present. No signs of infection. Integumentary (Hair, Skin) Wound #3 status is Open. Original cause of wound was Gradually Appeared. The date acquired was: 08/05/2020. The wound is located on the Left Gluteal fold. The wound measures 2cm length x 0.5cm width x 0.1cm depth; 0.785cm^2 area and 0.079cm^3 volume. There is Fat Layer (Subcutaneous Tissue) exposed. There is no tunneling or undermining noted. There is a medium amount of serosanguineous drainage noted. There is large (67-100%) red, pink granulation within the wound bed. There is no necrotic tissue within the wound bed. Assessment Active Problems ICD-10 Unspecified open wound of left buttock, initial encounter Multiple sclerosis Other seizures Rebecca [name], Rebecca [name]. ([id number]) Patient presents with a 3-week history of an open wound to her left buttocks. She has been using wound cleanser and taking Keflex. It appears that this may be caused by her underwear, increased moisture and/or sitting in her chair for too long. I recommended offloading the area and keeping the area dry with baby powder. For the wound bed I recommended Hydrofera Blue daily. I will see  her back in 2 weeks. Procedures Wound #3 Pre-procedure diagnosis of Wound #3 is a Pressure Ulcer located on the Left Gluteal fold . There was a Chemical/Enzymatic/Mechanical debridement performed by Kalman Shan, MD. With the following instrument(s): saline and gauze after achieving pain control using Lidocaine. Other agent used was gauze and saline. A time out was conducted at 13:55, prior to the start of the procedure. A Minimum amount of bleeding was controlled with Pressure. The procedure was tolerated well. Post Debridement Measurements: 2cm length x 0.5cm width x 0.1cm depth; 0.079cm^3 volume. Post debridement Stage noted as Category/Stage II. Character of Wound/Ulcer Post Debridement is improved. Post procedure Diagnosis Wound #3: Same as Pre-Procedure Plan Follow-up Appointments: Return Appointment in 2 weeks. Home Health: Wound #3 Left Gluteal fold: [place]: - [facility] for [facility] care. May utilize formulary equivalent dressing for wound treatment orders unless otherwise specified. Home Health Nurse may visit PRN to address patient s wound care needs. Bathing/ Shower/ Hygiene: May shower; gently cleanse wound with antibacterial soap, rinse and pat dry prior to dressing wounds - make sure dressing is dry after bathing Off-Loading: Gel wheelchair cushion Gel mattress overlay (Group 1) Turn and reposition every 2 hours WOUND #3: - Gluteal fold Wound Laterality: Left Cleanser: Soap and Water 1 x Per Day/30 Days Discharge Instructions: Gently cleanse wound with antibacterial soap, rinse and pat dry prior to dressing wounds Primary Dressing: Hydrofera Blue Ready Transfer Foam, 2.5x2.5 (in/in) (Home Health) 1 x Per Day/30 Days Discharge Instructions: Apply Hydrofera Blue Ready to wound bed as directed Secondary Dressing: [place] Dressing, 4x4 (in/in) (Home Health) 1 x Per Day/30 Days Discharge Instructions: Apply over dressing to secure in place. 1. Hydrofera Blue 2. Aggressive offloading 3. Keep the surrounding skin dry - baby powder 4. Follow-up in 2 weeks Electronic Signature(s) Signed: 08/26/2020 3:27:35 PM By: Kalman Shan  DO Entered By: Kalman Shan on 08/26/2020 15:26:51 Rebecca [name] ([id number]) -------------------------------------------------------------------------------- ROS/PFSH Details Patient Name: Rebecca [name]. Date of Service: 08/26/2020 12:45 PM Medical Record Number: [id number] Patient Account Number: [id number] Date of Birth/Sex: [birth year] (61 y.o. F) Treating RN: Donnamarie Poag Primary Care Provider: Delsa Grana Other Clinician: Referring Provider: Referral, Self Treating Provider/Extender: Yaakov Guthrie in Treatment: 0 Information Obtained From Patient Constitutional Symptoms (General Health) Complaints and Symptoms: Negative for: Fatigue; Fever; Chills; Marked Weight Change Eyes Complaints and Symptoms: Positive for: Glasses / Contacts Medical History: Negative for: Cataracts; Glaucoma; Optic Neuritis Ear/Nose/Mouth/Throat Complaints and Symptoms: Negative for: Difficult clearing ears; Sinusitis Medical History: Negative for: Chronic sinus problems/congestion; Middle ear problems Hematologic/Lymphatic Complaints and Symptoms: Negative for: Bleeding / Clotting Disorders; Human Immunodeficiency Virus Medical History: Negative for: Anemia; Hemophilia; Human Immunodeficiency Virus; Lymphedema; Sickle Cell Disease Respiratory Complaints and Symptoms: Negative for: Chronic or frequent coughs; Shortness of Breath Medical History: Negative for: Aspiration; Asthma; Chronic Obstructive Pulmonary Disease (COPD); Pneumothorax; Sleep Apnea; Tuberculosis Cardiovascular Complaints and Symptoms: Positive for: LE edema Medical History: Positive for: Arrhythmia Negative for: Angina; Congestive Heart Failure; Coronary Artery Disease; Deep Vein Thrombosis; Hypertension; Hypotension; Myocardial Infarction; Peripheral Arterial Disease; Peripheral Venous Disease; Phlebitis; Vasculitis Past Medical History Notes: aortic valve disease, bacterial endocarditis, heart  murmur Gastrointestinal Complaints and Symptoms: Negative for: Frequent diarrhea; Nausea; Vomiting Medical History: Negative for: Cirrhosis ; Colitis; Crohnos; Hepatitis A; Hepatitis B; Hepatitis C Past Medical History Notes: Irritable bowel syndrome Rebecca [name], SIDER. ([id number]) Endocrine Complaints and Symptoms: Negative for: Hepatitis; Thyroid disease; Polydypsia (Excessive Thirst) Medical History: Negative for:  Type I Diabetes; Type II Diabetes Immunological Complaints and Symptoms: Negative for: Hives; Itching Medical History: Negative for: Lupus Erythematosus; Raynaudos; Scleroderma Integumentary (Skin) Complaints and Symptoms: Positive for: Wounds; Breakdown; Swelling Medical History: Positive for: History of pressure wounds Negative for: History of Burn Musculoskeletal Complaints and Symptoms: Positive for: Muscle Weakness Medical History: Negative for: Gout; Rheumatoid Arthritis; Osteoarthritis; Osteomyelitis Psychiatric Complaints and Symptoms: Negative for: Anxiety; Claustrophobia Medical History: Negative for: Anorexia/bulimia; Confinement Anxiety Genitourinary Complaints and Symptoms: Review of System Notes: indwelling cath #16 french foley changed monthly considering SP cath Medical History: Negative for: End Stage Renal Disease Neurologic Complaints and Symptoms: Review of System Notes: MS dx 2001 Medical History: Positive for: Seizure Disorder - on kepra Negative for: Dementia; Neuropathy; Quadriplegia; Paraplegia Past Medical History Notes: Multiple Sclerosis Oncologic Medical History: Positive for: Received Chemotherapy - breast cancer Negative for: Received Radiation Past Medical History Notes: breast cancer Rebecca [name], HEPLER. ([id number]) Immunizations Pneumococcal Vaccine: Received Pneumococcal Vaccination: No Immunization Notes: patient does not remember Implantable Devices None Hospitalization / Surgery History Type of  Hospitalization/Surgery colonsocopy cholecytectomy right mastectomy Family and Social History Cancer: Yes - Mother,Father; Diabetes: Yes - Mother; Heart Disease: Yes - Father,Mother; Hereditary Spherocytosis: No; Hypertension: Yes - Siblings; Kidney Disease: No; Lung Disease: No; Seizures: No; Stroke: Yes - Mother,Father; Thyroid Problems: Yes - Siblings; Tuberculosis: No; Never smoker; Marital Status - Married; Alcohol Use: Never; Drug Use: No History; Caffeine Use: Daily - coffee; Financial Concerns: No; Food, Clothing or Shelter Needs: No; Support System Lacking: No; Transportation Concerns: No Electronic Signature(s) Signed: 08/26/2020 3:27:35 PM By: Kalman Shan DO Signed: 08/27/2020 10:03:03 AM By: Donnamarie Poag Entered By: Donnamarie Poag on 08/26/2020 13:16:19 Rebecca [name] ([id number]) -------------------------------------------------------------------------------- [place] Details Patient Name: Rebecca [name], Rebecca [name]. Date of Service: 08/26/2020 Medical Record Number: [id number] Patient Account Number: [id number] Date of Birth/Sex: 28-Jan-1961 (60 y.o. F) Treating RN: Donnamarie Poag Primary Care Provider: Delsa Grana Other Clinician: Referring Provider: Referral, Self Treating Provider/Extender: Yaakov Guthrie in Treatment: 0 Diagnosis Coding ICD-10 Codes Code Description [phone] Unspecified open wound of left buttock, initial encounter G35 Multiple sclerosis G40.89 Other seizures Facility Procedures CPT4 Code: [id number] Description: [phone] - WOUND CARE VISIT-LEV 2 EST PT Modifier: Quantity: 1 Physician Procedures CPT4 Code: [id number] Description: 54562 - WC PHYS LEVEL 3 - EST PT Modifier: Quantity: 1 CPT4 Code: Description: ICD-10 Diagnosis Description S31.829A Unspecified open wound of left buttock, initial encounter Modifier: Quantity: Electronic Signature(s) Signed: 08/26/2020 3:27:35 PM By: Kalman Shan DO Entered By: Kalman Shan on 08/26/2020 15:27:11

## 2020-08-27 NOTE — Progress Notes (Signed)
MODELL, FENDRICK ([id number]) Visit Report for 08/26/2020 Abuse/Suicide Risk Screen Details Patient Name: Rebecca [name], Rebecca [name]. Date of Service: 08/26/2020 12:45 PM Medical Record Number: [id number] Patient Account Number: [id number] Date of Birth/Sex: 1960/02/29 (60 y.o. F) Treating RN: Donnamarie Poag Primary Care Hollis Tuller: Delsa Grana Other Clinician: Referring Jebediah Macrae: Referral, Self Treating Journi Moffa/Extender: Yaakov Guthrie in Treatment: 0 Abuse/Suicide Risk Screen Items Answer ABUSE RISK SCREEN: Has anyone close to you tried to hurt or harm you recentlyo No Do you feel uncomfortable with anyone in your familyo No Has anyone forced you do things that you didnot want to doo No Electronic Signature(s) Signed: 08/27/2020 10:03:03 AM By: Donnamarie Poag Entered By: Donnamarie Poag on 08/26/2020 13:16:52 Rebecca [name] ([id number]) -------------------------------------------------------------------------------- Activities of Daily Living Details Patient Name: Rebecca [name], Rebecca [name]. Date of Service: 08/26/2020 12:45 PM Medical Record Number: [id number] Patient Account Number: [id number] Date of Birth/Sex: 1960/05/16 (60 y.o. F) Treating RN: Donnamarie Poag Primary Care Frederick Marro: Delsa Grana Other Clinician: Referring Vallie Fayette: Referral, Self Treating Idamae Coccia/Extender: Yaakov Guthrie in Treatment: 0 Activities of Daily Living Items Answer Activities of Daily Living (Please select one for each item) Drive Automobile Not Able Take Medications Need Assistance Use Telephone Need Assistance Care for Appearance Need Assistance Use Toilet Need Assistance Bath / Shower Need Assistance Dress Self Need Assistance Feed Self Completely Able Walk Need Assistance Get In / Out Bed Need Assistance Housework Need Assistance Prepare Meals Need Assistance Handle Money Need Assistance Shop for Self Need Assistance Electronic Signature(s) Signed: 08/27/2020 10:03:03 AM By: Donnamarie Poag Entered By: Donnamarie Poag on 08/26/2020 13:17:44 Rebecca [name] ([id number]) -------------------------------------------------------------------------------- Education Screening Details Patient Name: Rebecca [name]. Date of Service: 08/26/2020 12:45 PM Medical Record Number: [id number] Patient Account Number: [id number] Date of Birth/Sex: 26-Apr-1960 (60 y.o. F) Treating RN: Donnamarie Poag Primary Care Giovana Faciane: Delsa Grana Other Clinician: Referring Misha Antonini: Referral, Self Treating Britton Perkinson/Extender: Yaakov Guthrie in Treatment: 0 Primary Learner Assessed: Patient Learning Preferences/Education Level/Primary Language Learning Preference: Explanation Highest Education Level: High School Preferred Language: English Cognitive Barrier Language Barrier: No Translator Needed: No Memory Deficit: No Emotional Barrier: No Cultural/Religious Beliefs Affecting Medical Care: No Physical Barrier Impaired Vision: No Impaired Hearing: No Decreased Hand dexterity: No Knowledge/Comprehension Knowledge Level: High Comprehension Level: High Ability to understand written instructions: High Ability to understand verbal instructions: High Motivation Anxiety Level: Calm Cooperation: Cooperative Education Importance: Acknowledges Need Interest in Health Problems: Asks Questions Perception: Coherent Willingness to Engage in Self-Management High Activities: Readiness to Engage in Self-Management High Activities: Electronic Signature(s) Signed: 08/27/2020 10:03:03 AM By: Donnamarie Poag Entered ByDonnamarie Poag on 08/26/2020 13:18:11 Rebecca [name] ([id number]) -------------------------------------------------------------------------------- Fall Risk Assessment Details Patient Name: Rebecca [name]. Date of Service: 08/26/2020 12:45 PM Medical Record Number: [id number] Patient Account Number: [id number] Date of Birth/Sex: [birth year] (60 y.o. F) Treating RN: Donnamarie Poag Primary Care Lachell Rochette: Delsa Grana Other  Clinician: Referring Dally Oshel: Referral, Self Treating Ander Wamser/Extender: Yaakov Guthrie in Treatment: 0 Fall Risk Assessment Items Have you had 2 or more falls in the last 12 monthso 0 Yes Have you had any fall that resulted in injury in the last 12 monthso 0 No FALLS RISK SCREEN History of falling - immediate or within 3 months 25 Yes Secondary diagnosis (Do you have 2 or more medical diagnoseso) 15 Yes Ambulatory aid None/bed rest/wheelchair/nurse 0 No Crutches/cane/walker 15 Yes Furniture 0 No Intravenous therapy Access/Saline/Heparin Lock 0 No Gait/Transferring Normal/ bed rest/ wheelchair 0 No Weak (short steps with or without shuffle, stooped  but able to lift head while walking, may 10 Yes seek support from furniture) Impaired (short steps with shuffle, may have difficulty arising from chair, head down, impaired 0 No balance) Mental Status Oriented to own ability 0 Yes Electronic Signature(s) Signed: 08/27/2020 10:03:03 AM By: Donnamarie Poag Entered By: Donnamarie Poag on 08/26/2020 13:18:39 Rebecca [name] ([id number]) -------------------------------------------------------------------------------- Foot Assessment Details Patient Name: Rebecca [name]. Date of Service: 08/26/2020 12:45 PM Medical Record Number: [id number] Patient Account Number: [id number] Date of Birth/Sex: [birth year] (60 y.o. F) Treating RN: Donnamarie Poag Primary Care Lezette Kitts: Delsa Grana Other Clinician: Referring Cleotilde Spadaccini: Referral, Self Treating Teon Hudnall/Extender: Yaakov Guthrie in Treatment: 0 Foot Assessment Items Site Locations + = Sensation present, - = Sensation absent, C = Callus, U = Ulcer R = Redness, W = Warmth, M = Maceration, PU = Pre-ulcerative lesion F = Fissure, S = Swelling, D = Dryness Assessment Right: Left: Other Deformity: No No Prior Foot Ulcer: No No Prior Amputation: No No Charcot Joint: No No Ambulatory Status: Ambulatory With Help Assistance Device:  Walker Gait: Administrator, arts) Signed: 08/27/2020 10:03:03 AM By: Donnamarie Poag Entered By: Donnamarie Poag on 08/26/2020 13:19:36 Rebecca [name] ([id number]) -------------------------------------------------------------------------------- Nutrition Risk Screening Details Patient Name: Rebecca [name]. Date of Service: 08/26/2020 12:45 PM Medical Record Number: [id number] Patient Account Number: [id number] Date of Birth/Sex: 1960-05-10 (60 y.o. F) Treating RN: Donnamarie Poag Primary Care Idrissa Beville: Delsa Grana Other Clinician: Referring Nirel Babler: Referral, Self Treating Stacie Templin/Extender: Yaakov Guthrie in Treatment: 0 Height (in): 64 Weight (lbs): 214 Body Mass Index (BMI): 36.7 Nutrition Risk Screening Items Score Screening NUTRITION RISK SCREEN: I have an illness or condition that made me change the kind and/or amount of food I eat 0 No I eat fewer than two meals per day 0 No I eat few fruits and vegetables, or milk products 0 No I have three or more drinks of beer, liquor or wine almost every day 0 No I have tooth or mouth problems that make it hard for me to eat 0 No I don't always have enough money to buy the food I need 0 No I eat alone most of the time 0 No I take three or more different prescribed or over-the-counter drugs a day 1 Yes Without wanting to, I have lost or gained 10 pounds in the last six months 0 No I am not always physically able to shop, cook and/or feed myself 0 No Nutrition Protocols Good Risk Protocol 0 No interventions needed Moderate Risk Protocol High Risk Proctocol Risk Level: Good Risk Score: 1 Electronic Signature(s) Signed: 08/27/2020 10:03:03 AM By: Donnamarie Poag Entered ByDonnamarie Poag on 08/26/2020 13:19:06

## 2020-08-27 NOTE — Progress Notes (Addendum)
CHENA, CHOHAN ([id number]) Visit Report for 08/26/2020 Allergy List Details Patient Name: Rebecca [name], Rebecca [name]. Date of Service: 08/26/2020 12:45 PM Medical Record Number: [id number] Patient Account Number: [id number] Date of Birth/Sex: [birth year] (60 y.o. F) Treating RN: Donnamarie Poag Primary Care Shaylon Aden: Delsa Grana Other Clinician: Referring Malikiah Debarr: Referral, Self Treating Brylinn Teaney/Extender: Yaakov Guthrie in Treatment: 0 Allergies Active Allergies fentanyl Reaction: nausea Severity: Moderate Sulfa (Sulfonamide Antibiotics) Severity: Moderate Allergy Notes Electronic Signature(s) Signed: 08/27/2020 10:03:03 AM By: Donnamarie Poag Entered By: Donnamarie Poag on 08/26/2020 13:10:56 Rebecca [name] ([id number]) -------------------------------------------------------------------------------- Arrival Information Details Patient Name: Rebecca [name]. Date of Service: 08/26/2020 12:45 PM Medical Record Number: [id number] Patient Account Number: [id number] Date of Birth/Sex: 1960-11-19 (60 y.o. F) Treating RN: Donnamarie Poag Primary Care Shaquill Iseman: Delsa Grana Other Clinician: Referring Jamaiyah Pyle: Referral, Self Treating Dora Simeone/Extender: Yaakov Guthrie in Treatment: 0 Visit Information Patient Arrived: Wheel Chair Arrival Time: 13:06 Accompanied By: husband Transfer Assistance: Manual Patient Identification Verified: Yes Secondary Verification Process Completed: Yes Patient Has Alerts: Yes Patient Alerts: NOT diabetic History Since Last Visit Has Dressing in Place as Prescribed: Yes Electronic Signature(s) Signed: 08/27/2020 10:03:03 AM By: Donnamarie Poag Entered By: Donnamarie Poag on 08/26/2020 13:08:52 Rebecca [name] ([id number]) -------------------------------------------------------------------------------- Clinic Level of Care Assessment Details Patient Name: Rebecca [name]. Date of Service: 08/26/2020 12:45 PM Medical Record Number: [id number] Patient Account Number:  [id number] Date of Birth/Sex: 11/13/1960 (60 y.o. F) Treating RN: Donnamarie Poag Primary Care Maryetta Shafer: Delsa Grana Other Clinician: Referring Marquette Piontek: Referral, Self Treating Yosiel Thieme/Extender: Yaakov Guthrie in Treatment: 0 Clinic Level of Care Assessment Items TOOL 1 Quantity Score []  - Use when EandM and Procedure is performed on INITIAL visit 0 ASSESSMENTS - Nursing Assessment / Reassessment X - General Physical Exam (combine w/ comprehensive assessment (listed just below) when performed on new 1 20 pt. evals) X- 1 25 Comprehensive Assessment (HX, ROS, Risk Assessments, Wounds Hx, etc.) ASSESSMENTS - Wound and Skin Assessment / Reassessment []  - Dermatologic / Skin Assessment (not related to wound area) 0 ASSESSMENTS - Ostomy and/or Continence Assessment and Care []  - Incontinence Assessment and Management 0 []  - 0 Ostomy Care Assessment and Management (repouching, etc.) PROCESS - Coordination of Care X - Simple Patient / Family Education for ongoing care 1 15 []  - 0 Complex (extensive) Patient / Family Education for ongoing care []  - 0 Staff obtains Programmer, systems, Records, Test Results / Process Orders []  - 0 Staff telephones HHA, Nursing Homes / Clarify orders / etc []  - 0 Routine Transfer to another Facility (non-emergent condition) []  - 0 Routine Hospital Admission (non-emergent condition) []  - 0 New Admissions / Biomedical engineer / Ordering NPWT, Apligraf, etc. []  - 0 Emergency Hospital Admission (emergent condition) PROCESS - Special Needs []  - Pediatric / Minor Patient Management 0 []  - 0 Isolation Patient Management []  - 0 Hearing / Language / Visual special needs []  - 0 Assessment of Community assistance (transportation, D/C planning, etc.) []  - 0 Additional assistance / Altered mentation []  - 0 Support Surface(s) Assessment (bed, cushion, seat, etc.) INTERVENTIONS - Miscellaneous []  - External ear exam 0 []  - 0 Patient Transfer (multiple staff  / Civil Service fast streamer / Similar devices) []  - 0 Simple Staple / Suture removal (25 or less) []  - 0 Complex Staple / Suture removal (26 or more) []  - 0 Hypo/Hyperglycemic Management (do not check if billed separately) []  - 0 Ankle / Brachial Index (ABI) - do not check if billed separately Has the patient been seen  at the hospital within the last three years: Yes Total Score: 60 Level Of Care: New/Established - Level 2 NARY, SNEED ([id number]) Electronic Signature(s) Signed: 08/27/2020 10:03:03 AM By: Donnamarie Poag Entered By: Donnamarie Poag on 08/26/2020 14:13:27 Rebecca [name] ([id number]) -------------------------------------------------------------------------------- Encounter Discharge Information Details Patient Name: Rebecca [name]. Date of Service: 08/26/2020 12:45 PM Medical Record Number: [id number] Patient Account Number: [id number] Date of Birth/Sex: 1960/10/16 (60 y.o. F) Treating RN: Donnamarie Poag Primary Care Zyon Rosser: Delsa Grana Other Clinician: Referring Rhodia Acres: Referral, Self Treating Evony Rezek/Extender: Yaakov Guthrie in Treatment: 0 Encounter Discharge Information Items Post Procedure Vitals Discharge Condition: Stable Temperature (F): 97.9 Ambulatory Status: Wheelchair Pulse (bpm): 76 Discharge Destination: Home Respiratory Rate (breaths/min): 16 Transportation: Private Auto Blood Pressure (mmHg): 163/81 Accompanied By: Olam Idler Schedule Follow-up Appointment: Yes Clinical Summary of Care: Electronic Signature(s) Signed: 08/27/2020 10:03:03 AM By: Donnamarie Poag Entered By: Donnamarie Poag on 08/26/2020 14:14:28 Rebecca [name] ([id number]) -------------------------------------------------------------------------------- Lower Extremity Assessment Details Patient Name: Rebecca [name]. Date of Service: 08/26/2020 12:45 PM Medical Record Number: [id number] Patient Account Number: [id number] Date of Birth/Sex: Jul 15, 1960 (60 y.o. F) Treating RN: Donnamarie Poag Primary  Care Orlo Brickle: Delsa Grana Other Clinician: Referring Carlito Bogert: Referral, Self Treating Doyl Bitting/Extender: Yaakov Guthrie in Treatment: 0 Edema Assessment Assessed: [Left: Yes] [Right: Yes] Edema: [Left: Yes] [Right: Yes] Electronic Signature(s) Signed: 08/27/2020 10:03:03 AM By: Donnamarie Poag Entered By: Donnamarie Poag on 08/26/2020 13:19:49 Rebecca [name] ([id number]) -------------------------------------------------------------------------------- Multi Wound Chart Details Patient Name: Rebecca [name]. Date of Service: 08/26/2020 12:45 PM Medical Record Number: [id number] Patient Account Number: [id number] Date of Birth/Sex: [birth year] (60 y.o. F) Treating RN: Donnamarie Poag Primary Care Mistina Coatney: Delsa Grana Other Clinician: Referring Zeshan Sena: Referral, Self Treating Nicolas Sisler/Extender: Yaakov Guthrie in Treatment: 0 Vital Signs Height(in): 64 Pulse(bpm): 41 Weight(lbs): 214 Blood Pressure(mmHg): 163/81 Body Mass Index(BMI): 37 Temperature(F): 97.9 Respiratory Rate(breaths/min): 16 Photos: [N/A:N/A] Wound Location: Left Gluteal fold N/A N/A Wounding Event: Gradually Appeared N/A N/A Primary Etiology: Pressure Ulcer N/A N/A Secondary Etiology: Auto-immune N/A N/A Comorbid History: Arrhythmia, History of pressure N/A N/A wounds, Seizure Disorder, Received Chemotherapy Date Acquired: 08/05/2020 N/A N/A Weeks of Treatment: 0 N/A N/A Wound Status: Open N/A N/A Measurements L x W x D (cm) 2x0.5x0.1 N/A N/A Area (cm) : 0.785 N/A N/A Volume (cm) : 0.079 N/A N/A Classification: Category/Stage II N/A N/A Exudate Amount: Medium N/A N/A Exudate Type: Serosanguineous N/A N/A Exudate Color: red, brown N/A N/A Granulation Amount: Large (67-100%) N/A N/A Granulation Quality: Red, Pink N/A N/A Necrotic Amount: None Present (0%) N/A N/A Exposed Structures: Fat Layer (Subcutaneous Tissue): N/A N/A Yes Fascia: No Tendon: No Muscle: No Joint: No Bone:  No Debridement: Chemical/Enzymatic/Mechanical N/A N/A Pre-procedure Verification/Time 13:55 N/A N/A Out Taken: Pain Control: Lidocaine N/A N/A Instrument: Other(saline and gauze) N/A N/A Bleeding: Minimum N/A N/A Hemostasis Achieved: Pressure N/A N/A Debridement Treatment Procedure was tolerated well N/A N/A Response: Post Debridement 2x0.5x0.1 N/A N/A Measurements L x W x D (cm) Post Debridement Volume: 0.079 N/A N/A (cm) Post Debridement Stage: Category/Stage II N/A N/A Procedures Performed: Debridement N/A N/A DANIKA, KLUENDER ([id number]) Treatment Notes Wound #3 (Gluteal fold) Wound Laterality: Left Cleanser Soap and Water Discharge Instruction: Gently cleanse wound with antibacterial soap, rinse and pat dry prior to dressing wounds Peri-Wound Care Topical Primary Dressing Hydrofera Blue Ready Transfer Foam, 2.5x2.5 (in/in) Discharge Instruction: Apply Hydrofera Blue Ready to wound bed as directed Secondary Dressing Huntley, 4x4 (in/in) Discharge Instruction: Apply over dressing to  secure in place. Secured With Compression Wrap Compression Stockings Add-Ons Electronic Signature(s) Signed: 08/26/2020 3:27:35 PM By: Kalman Shan DO Entered By: Kalman Shan on 08/26/2020 15:10:21 Rebecca [name] ([id number]) -------------------------------------------------------------------------------- [place] Details Patient Name: SHERIDAN, HEW. Date of Service: 08/26/2020 12:45 PM Medical Record Number: [id number] Patient Account Number: [id number] Date of Birth/Sex: 1960-06-20 (60 y.o. F) Treating RN: Donnamarie Poag Primary Care Camdon Saetern: Delsa Grana Other Clinician: Referring Sianni Cloninger: Referral, Self Treating Antion Andres/Extender: Yaakov Guthrie in Treatment: 0 Active Inactive Electronic Signature(s) Signed: 09/22/2020 8:00:42 AM By: Donnamarie Poag Previous Signature: 08/27/2020 10:03:03 AM Version By: Donnamarie Poag Entered By:  Donnamarie Poag on 09/22/2020 08:00:42 Rebecca [name] ([id number]) -------------------------------------------------------------------------------- Pain Assessment Details Patient Name: CORRIE, REDER. Date of Service: 08/26/2020 12:45 PM Medical Record Number: [id number] Patient Account Number: [id number] Date of Birth/Sex: Jul 26, 1960 (60 y.o. F) Treating RN: Donnamarie Poag Primary Care Anaalicia Reimann: Delsa Grana Other Clinician: Referring Girtha Kilgore: Referral, Self Treating Giani Betzold/Extender: Yaakov Guthrie in Treatment: 0 Active Problems Location of Pain Severity and Description of Pain Patient Has Paino No Site Locations Rate the pain. Current Pain Level: 0 Pain Management and Medication Current Pain Management: Electronic Signature(s) Signed: 08/27/2020 10:03:03 AM By: Donnamarie Poag Entered By: Donnamarie Poag on 08/26/2020 13:09:01 Rebecca [name] ([id number]) -------------------------------------------------------------------------------- Patient/Caregiver Education Details Patient Name: Rebecca [name]. Date of Service: 08/26/2020 12:45 PM Medical Record Number: [id number] Patient Account Number: [id number] Date of Birth/Gender: 10-11-1960 (60 y.o. F) Treating RN: Donnamarie Poag Primary Care Physician: Delsa Grana Other Clinician: Referring Physician: Referral, Self Treating Physician/Extender: Yaakov Guthrie in Treatment: 0 Education Assessment Education Provided To: Patient and Caregiver Education Topics Provided Basic Hygiene: Offloading: Pressure: [facility] [place]: Wound/Skin Impairment: Electronic Signature(s) Signed: 08/27/2020 10:03:03 AM By: Donnamarie Poag Entered By: Donnamarie Poag on 08/26/2020 13:53:36 Rebecca [name] ([id number]) -------------------------------------------------------------------------------- Wound Assessment Details Patient Name: Rebecca [name]. Date of Service: 08/26/2020 12:45 PM Medical Record Number: [id number] Patient  Account Number: [id number] Date of Birth/Sex: [birth year] (60 y.o. F) Treating RN: Donnamarie Poag Primary Care Alany Borman: Delsa Grana Other Clinician: Referring Floy Riegler: Referral, Self Treating Chrys Landgrebe/Extender: Yaakov Guthrie in Treatment: 0 Wound Status Wound Number: 3 Primary Pressure Ulcer Etiology: Wound Location: Left Gluteal fold Secondary Auto-immune Wounding Event: Gradually Appeared Etiology: Date Acquired: 08/05/2020 Wound Status: Open Weeks Of Treatment: 0 Comorbid Arrhythmia, History of pressure wounds, Seizure Clustered Wound: No History: Disorder, Received Chemotherapy Photos Wound Measurements Length: (cm) 2 Width: (cm) 0.5 Depth: (cm) 0.1 Area: (cm) 0.785 Volume: (cm) 0.079 % Reduction in Area: % Reduction in Volume: Tunneling: No Undermining: No Wound Description Classification: Category/Stage II Exudate Amount: Medium Exudate Type: Serosanguineous Exudate Color: red, brown Foul Odor After Cleansing: No Slough/Fibrino No Wound Bed Granulation Amount: Large (67-100%) Exposed Structure Granulation Quality: Red, Pink Fascia Exposed: No Necrotic Amount: None Present (0%) Fat Layer (Subcutaneous Tissue) Exposed: Yes Tendon Exposed: No Muscle Exposed: No Joint Exposed: No Bone Exposed: No Electronic Signature(s) Signed: 08/27/2020 10:03:03 AM By: Donnamarie Poag Entered By: Donnamarie Poag on 08/26/2020 13:33:50 Rebecca [name] ([id number]) -------------------------------------------------------------------------------- Vitals Details Patient Name: Rebecca [name]. Date of Service: 08/26/2020 12:45 PM Medical Record Number: [id number] Patient Account Number: [id number] Date of Birth/Sex: 1960/08/27 (60 y.o. F) Treating RN: Donnamarie Poag Primary Care Raphael Fitzpatrick: Delsa Grana Other Clinician: Referring Amarylis Rovito: Referral, Self Treating Dashea Mcmullan/Extender: Yaakov Guthrie in Treatment: 0 Vital Signs Time Taken: 13:09 Temperature (F): 97.9 Height  (in): 64 Pulse (bpm): 76 Source: Stated Respiratory Rate (breaths/min): 16 Weight (lbs):  214 Blood Pressure (mmHg): 163/81 Source: Stated Reference Range: 80 - 120 mg / dl Body Mass Index (BMI): 36.7 Electronic Signature(s) Signed: 08/27/2020 10:03:03 AM By: Donnamarie Poag Entered ByDonnamarie Poag on 08/26/2020 13:09:26

## 2020-09-09 ENCOUNTER — Other Ambulatory Visit: Payer: Self-pay | Admitting: Gastroenterology

## 2020-09-09 ENCOUNTER — Ambulatory Visit: Payer: Medicare Other | Admitting: Internal Medicine

## 2020-09-18 ENCOUNTER — Ambulatory Visit
Admission: RE | Admit: 2020-09-18 | Discharge: 2020-09-18 | Disposition: A | Discharge status: Home / Self Care | Payer: Medicare Other | Source: Ambulatory Visit | Attending: Oncology | Admitting: Oncology

## 2020-09-18 ENCOUNTER — Other Ambulatory Visit: Payer: Self-pay

## 2020-09-18 DIAGNOSIS — Z1231 Encounter for screening mammogram for malignant neoplasm of breast: Secondary | ICD-10-CM | POA: Diagnosis not present

## 2020-09-23 ENCOUNTER — Ambulatory Visit: Payer: Medicare Other | Admitting: Internal Medicine

## 2020-10-02 ENCOUNTER — Inpatient Hospital Stay: Payer: Medicare Other

## 2020-10-06 ENCOUNTER — Inpatient Hospital Stay: Payer: Medicare Other | Attending: Oncology

## 2020-10-06 ENCOUNTER — Other Ambulatory Visit: Payer: Self-pay

## 2020-10-06 ENCOUNTER — Ambulatory Visit (INDEPENDENT_AMBULATORY_CARE_PROVIDER_SITE_OTHER): Payer: Medicare Other | Admitting: Gastroenterology

## 2020-10-06 VITALS — BP 145/83 | HR 88 | Temp 97.8°F

## 2020-10-06 DIAGNOSIS — K219 Gastro-esophageal reflux disease without esophagitis: Secondary | ICD-10-CM | POA: Diagnosis not present

## 2020-10-06 DIAGNOSIS — G35 Multiple sclerosis: Secondary | ICD-10-CM | POA: Insufficient documentation

## 2020-10-06 DIAGNOSIS — C50911 Malignant neoplasm of unspecified site of right female breast: Secondary | ICD-10-CM | POA: Insufficient documentation

## 2020-10-06 DIAGNOSIS — R1319 Other dysphagia: Secondary | ICD-10-CM | POA: Diagnosis not present

## 2020-10-06 DIAGNOSIS — R600 Localized edema: Secondary | ICD-10-CM | POA: Insufficient documentation

## 2020-10-06 DIAGNOSIS — Z79899 Other long term (current) drug therapy: Secondary | ICD-10-CM | POA: Diagnosis not present

## 2020-10-06 DIAGNOSIS — R1310 Dysphagia, unspecified: Secondary | ICD-10-CM | POA: Insufficient documentation

## 2020-10-06 DIAGNOSIS — K8689 Other specified diseases of pancreas: Secondary | ICD-10-CM

## 2020-10-06 DIAGNOSIS — I890 Lymphedema, not elsewhere classified: Secondary | ICD-10-CM | POA: Diagnosis not present

## 2020-10-06 DIAGNOSIS — Z452 Encounter for adjustment and management of vascular access device: Secondary | ICD-10-CM | POA: Insufficient documentation

## 2020-10-06 DIAGNOSIS — Z171 Estrogen receptor negative status [ER-]: Secondary | ICD-10-CM | POA: Insufficient documentation

## 2020-10-06 DIAGNOSIS — Z95828 Presence of other vascular implants and grafts: Secondary | ICD-10-CM

## 2020-10-06 MED ORDER — HEPARIN SOD (PORK) LOCK FLUSH 100 UNIT/ML IV SOLN
INTRAVENOUS | Status: AC
  Filled 2020-10-06: qty 5

## 2020-10-06 MED ORDER — HEPARIN SOD (PORK) LOCK FLUSH 100 UNIT/ML IV SOLN
500.0000 [IU] | Freq: Once | INTRAVENOUS | Status: AC
  Administered 2020-10-06: 500 [IU] via INTRAVENOUS
  Filled 2020-10-06: qty 5

## 2020-10-06 MED ORDER — SODIUM CHLORIDE 0.9% FLUSH
10.0000 mL | INTRAVENOUS | Status: DC | PRN
  Administered 2020-10-06: 10 mL via INTRAVENOUS
  Filled 2020-10-06: qty 10

## 2020-10-06 NOTE — Progress Notes (Signed)
Rebecca Antigua, MD [address]  [place]  [place], [place] [address]  Main: [phone]  Fax: [phone]   Primary Care Physician: Delsa Grana, PA-C   Chief complaint: Dysphagia, diarrhea  HPI: Rebecca [name] is a 60 y.o. female here for follow-up of dysphagia and diarrhea.  Patient with history of multiple sclerosis.  Diagnosed with pancreatic insufficiency based on fecal pancreatic elastase.  Has been taking Creon and reports improvement in symptoms.  States dysphagia has completely resolved since dilation during EGD.  Is reporting that bowel movements are more formed and is having about 2 bowel movements a day.  Her significant other is with her, who helps provide care for her, and feels that her bowel movements are more formed and he also has an easier time cleaning her after bowel movement.   ROS: All ROS reviewed and negative except as per HPI   Past Medical History:  Diagnosis Date   Allergy    Aortic valve disease    Mild AS / AI - most recent Echo demonstrated tricuspid aortic valve.   Bacterial endocarditis    History of .   Bilateral lower extremity edema    Noncardiac.  Chronic. LE Venous dopplers - negative for DVT.; Echocardiogram January 2016: Normal EF with normal wall motion and valve function. Only grade 1 diastolic dysfunction. EF 60-65%. Mild MR   Breast cancer ([place]) 12/31/2013   Right breast, 12:00, 1.5 cm, T1c,N0 invasive mammary carcinoma, triple negative. --> Rx with Chemo   Cervical stenosis of spine    GERD (gastroesophageal reflux disease) April or May 2022   Heart murmur 2013   Herpes zoster    IBS (irritable bowel syndrome)    Lymphedema    has legs wrapped at [facility]   Multiple sclerosis ([place]) 2001   Walks from room to room @ home; but Wheelchair when going out.   Neuromuscular disorder ([place])    MS   PONV (postoperative nausea and vomiting)    Related to Fentanyl   Seizures ([place])    Takes Keppra   Syncope and collapse      Past Surgical History:  Procedure Laterality Date   ANKLE SURGERY     Left   ANKLE SURGERY     ANTERIOR CERVICAL DECOMP/DISCECTOMY FUSION  11/17/2011   Procedure: ANTERIOR CERVICAL DECOMPRESSION/DISCECTOMY FUSION 2 LEVELS;  Surgeon: Erline Levine, MD;  Location: [place] [facility];  Service: Neurosurgery;  Laterality: N/A;  Cervical Five-Six Six-Seven Anterior cervical decompression/diskectomy/fusion   BREAST BIOPSY Right 12/31/2013   invasive mammary   BREAST SURGERY Right 02/03/2014   Right simple mastectomy with sentinel node biopsy.   CHOLECYSTECTOMY     COLONOSCOPY  2014   ESOPHAGOGASTRODUODENOSCOPY (EGD) WITH PROPOFOL N/A 07/16/2020   Procedure: ESOPHAGOGASTRODUODENOSCOPY (EGD) WITH PROPOFOL;  Surgeon: Virgel Manifold, MD;  Location: [facility];  Service: Endoscopy;  Laterality: N/A;  Patient has MS and will need assistance   HYSTEROSCOPY WITH D & C N/A 11/27/2018   Procedure: DILATATION AND CURETTAGE /HYSTEROSCOPY;  Surgeon: Gae Dry, MD;  Location: [facility];  Service: Gynecology;  Laterality: N/A;   Lower extremity venous Dopplers  02/27/2013   No LE DVT   MASTECTOMY Right 2015   Port a cath insertion Right 01/19/2010   PORT-A-CATH REMOVAL     right   PORT-A-CATH REMOVAL Right 09/03/2013   Procedure: REMOVAL PORT-A-CATH;  Surgeon: Conrad Interlaken, MD;  Location: [place];  Service: Vascular;  Laterality: Right;   [place]  Cervical steinois Dr. Vertell Limber 2014 I think?   TONSILLECTOMY     TRANSTHORACIC ECHOCARDIOGRAM  03/2013; 02/2014   a) Normal LV size and function with EF 60-65%.; Cannot exclude bicuspid aortic valve with mild AS and mild AI.; b) Normal EF with normal wall motion and valve function x Mild MR. G2 DD. EF 60-65%. Tricuspid AoV   UPPER GI ENDOSCOPY  2014    Prior to Admission medications   Medication Sig Start Date End Date Taking? Authorizing Provider  amantadine (SYMMETREL) 100 MG capsule Take 1 capsule (100 mg total) by mouth 3 (three) times daily.  12/16/19  Yes Penumalli, Earlean Polka, MD  baclofen (LIORESAL) 10 MG tablet TAKE 1 TO 2 TABLETS BY MOUTH 3 TIMES A DAY 10/30/19  Yes Penumalli, Earlean Polka, MD  Cranberry 1000 MG CAPS Take by mouth.   Yes [provider]  CREON 36000-114000 units CPEP capsule TAKE 1 CAPSULE (36,000 UNITS TOTAL) BY MOUTH WITH BREAKFAST, WITH LUNCH, AND WITH EVENING MEAL AND 1 CAPSULE (36,000 UNITS TOTAL) WITH SNACKS. 09/09/20  Yes Rebecca [name] B, MD  Disposable Gloves (ASSURANCE VINYL EXAM GLOVES) MISC Neurogenic bladder; MS; LON 99 months; for use for personal hygiene 12/26/17  Yes Lada, Satira Anis, MD  gluconic acid-citric acid (RENACIDIN) irrigation IRRIGATE WITH 30 MLS AS DIRECTED 3 (THREE) TIMES DAILY. 06/30/20 10/28/20 Yes Vaillancourt, Samantha, PA-C  Glucosamine-Chondroitin (COSAMIN DS PO) Take 1 tablet by mouth 2 (two) times daily.   Yes [provider]  ibuprofen (ADVIL) 600 MG tablet TAKE 1 TABLET BY MOUTH EVERY 8 HOURS AS NEEDED. 07/27/20  Yes Delsa Grana, PA-C  levETIRAcetam (KEPPRA) 500 MG tablet Take 1 tablet (500 mg total) by mouth 2 (two) times daily. 12/16/19  Yes Penumalli, Earlean Polka, MD  lidocaine-prilocaine (EMLA) cream Apply 1 application topically as needed. 09/20/19  Yes Lloyd Huger, MD  Misc Natural Products (LEG VEIN & CIRCULATION) TABS Take 1 tablet by mouth 2 (two) times daily.    Yes [provider]  Multiple Vitamins-Minerals (HAIR SKIN AND NAILS FORMULA) TABS Take 1 tablet by mouth 2 (two) times daily.    Yes [provider]  Multiple Vitamins-Minerals (MULTIVITAMIN PO) Take 1 tablet by mouth daily.    Yes [provider]  nystatin (MYCOSTATIN/NYSTOP) powder Apply 1 application topically 3 (three) times daily as needed. For rash/raw skin as needed 07/31/19  Yes Delsa Grana, PA-C  omeprazole (PRILOSEC) 40 MG capsule TAKE 1 CAPSULE (40 MG TOTAL) BY MOUTH DAILY. 09/09/20 10/09/20 Yes Virgel Manifold, MD  oxybutynin (DITROPAN-XL) 10 MG 24 hr tablet  Take 1 tablet (10 mg total) by mouth 2 (two) times daily. 09/11/19  Yes Penumalli, Earlean Polka, MD  REBIF REBIDOSE 44 MCG/0.5ML SOAJ Inject  44 mcg Sub-q 3 times weekly Rotate site after each injection 01/28/20  Yes Penumalli, Vikram R, MD  tiZANidine (ZANAFLEX) 4 MG tablet TAKE 1 TABLET (4 MG TOTAL) BY MOUTH 2 (TWO) TIMES DAILY. 09/05/19  Yes Jamse Arn, MD  Turmeric 500 MG CAPS Take 500 mg by mouth daily.   Yes [provider]    Family History  Problem Relation Age of Onset   Cancer Father        skin   Heart disease Father    Heart attack Father        heart attack in his 42's   Thyroid disease Sister    Ovarian cancer Cousin    Breast cancer Maternal Aunt 60   Breast cancer Maternal Grandmother  74   Bladder Cancer Neg Hx    Kidney cancer Neg Hx      Social History   Tobacco Use   Smoking status: Never   Smokeless tobacco: Never   Tobacco comments:    Never smoked. My mom smoked. Passed 04/18/20. Dad smoked, quit 1973. Sister smokes.  Vaping Use   Vaping Use: Never used  Substance Use Topics   Alcohol use: No   Drug use: No    Allergies as of 10/06/2020 - Review Complete 10/06/2020  Allergen Reaction Noted   Fentanyl Nausea And Vomiting and Nausea Only 02/04/2014   Sulfa antibiotics Hives and Other (See Comments) 06/08/2011    Physical Examination:  Constitutional: General:   Alert,  Well-developed, well-nourished, pleasant and cooperative in NAD BP (!) 145/83   Pulse 88   Temp 97.8 F (36.6 C) (Oral)   Respiratory: Normal respiratory effort  Gastrointestinal:  Soft, non-tender and non-distended without masses, hepatosplenomegaly or hernias noted.  No guarding or rebound tenderness.     Cardiac: No clubbing or edema.  No cyanosis. Normal posterior tibial pedal pulses noted.  Psych:  Alert and cooperative. Normal mood and affect.  Musculoskeletal:  Normal gait. Head normocephalic, atraumatic. Symmetrical without gross deformities. 5/5 Lower  extremity strength bilaterally.  Skin: Warm. Intact without significant lesions or rashes. No jaundice.  Neck: Supple, trachea midline  Lymph: No cervical lymphadenopathy  Psych:  Alert and oriented x3, Alert and cooperative. Normal mood and affect.  Labs: CMP     Component Value Date/Time   NA 141 04/14/2020 1235   NA 143 07/28/2016 1520   NA 139 06/05/2014 0951   K 3.7 04/14/2020 1235   K 3.8 06/05/2014 0951   CL 105 04/14/2020 1235   CL 107 06/05/2014 0951   CO2 26 04/14/2020 1235   CO2 28 06/05/2014 0951   GLUCOSE 81 04/14/2020 1235   GLUCOSE 130 (H) 06/05/2014 0951   BUN 15 04/14/2020 1235   BUN 12 07/28/2016 1520   BUN 12 06/05/2014 0951   CREATININE 0.54 04/14/2020 1235   CALCIUM 9.1 04/14/2020 1235   CALCIUM 9.4 06/05/2014 0951   PROT 7.4 04/14/2020 1235   PROT 7.0 07/28/2016 1520   PROT 7.0 05/29/2014 0949   ALBUMIN 4.3 01/03/2020 1743   ALBUMIN 4.3 07/28/2016 1520   ALBUMIN 4.1 05/29/2014 0949   AST 22 04/14/2020 1235   AST 24 05/29/2014 0949   ALT 25 04/14/2020 1235   ALT 24 05/29/2014 0949   ALKPHOS 85 01/03/2020 1743   ALKPHOS 58 05/29/2014 0949   BILITOT 0.4 04/14/2020 1235   BILITOT 0.3 07/28/2016 1520   BILITOT 0.6 05/29/2014 0949   GFRNONAA >60 01/03/2020 1743   GFRNONAA 101 01/18/2019 0000   GFRAA 117 01/18/2019 0000   Lab Results  Component Value Date   WBC 4.0 04/14/2020   HGB 12.8 04/14/2020   HCT 38.1 04/14/2020   MCV 90.5 04/14/2020   PLT 126 (L) 04/14/2020    Imaging Studies:   Assessment and Plan:   ZOII KALIN is a 60 y.o. y/o female here for follow-up of dysphagia and diarrhea  Dysphagia resolved post dilation of Schatzki's ring on last EGD.  Is also taking Prilosec for reflux and this has resolved her reflux symptoms as well  (Risks of PPI use were discussed with patient including bone loss, C. Diff diarrhea, pneumonia, infections, CKD, electrolyte abnormalities.  Pt. Verbalizes understanding and chooses to continue  the medication.)  Given patients with Schatzki's ring  have been shown to do better on PPI as far as needing repeat dilations, then without (see up-to-date), reasonable to continue the medication at this time in this patient  Diarrhea improved with Creon  Continue current dosage given improvement in symptoms  If symptoms return, patient advised to call us back and we will patient and family verbalized understanding    Dr Rebecca [name]

## 2020-10-11 ENCOUNTER — Other Ambulatory Visit: Payer: Self-pay | Admitting: Diagnostic Neuroimaging

## 2020-10-11 ENCOUNTER — Encounter: Payer: Self-pay | Admitting: Emergency Medicine

## 2020-10-11 ENCOUNTER — Emergency Department
Admission: EM | Admit: 2020-10-11 | Discharge: 2020-10-11 | Disposition: A | Discharge status: Home / Self Care | Payer: Medicare Other | Attending: Emergency Medicine | Admitting: Emergency Medicine

## 2020-10-11 DIAGNOSIS — Z853 Personal history of malignant neoplasm of breast: Secondary | ICD-10-CM | POA: Diagnosis not present

## 2020-10-11 DIAGNOSIS — T83098A Other mechanical complication of other indwelling urethral catheter, initial encounter: Secondary | ICD-10-CM | POA: Diagnosis present

## 2020-10-11 DIAGNOSIS — Y846 Urinary catheterization as the cause of abnormal reaction of the patient, or of later complication, without mention of misadventure at the time of the procedure: Secondary | ICD-10-CM | POA: Diagnosis not present

## 2020-10-11 DIAGNOSIS — T839XXA Unspecified complication of genitourinary prosthetic device, implant and graft, initial encounter: Secondary | ICD-10-CM

## 2020-10-11 NOTE — Discharge Instructions (Addendum)
Please seek medical attention for any high fevers, chest pain, shortness of breath, change in behavior, persistent vomiting, bloody stool or any other new or concerning symptoms.  

## 2020-10-11 NOTE — ED Provider Notes (Signed)
[facility] Emergency Department Provider Note   ____________________________________________   I have reviewed the triage vital signs and the nursing notes.   HISTORY  Chief Complaint Catheter Replacement   History limited by: Not limited   HPI Rebecca [name] is a 60 y.o. female who presents to the emergency department today because of concerns for catheter being pulled out.  Patient does have chronic indwelling catheter.  She states it was accidentally pulled out earlier today.  When she called home health they said it would be a number of hours before they be able to get there so they recommended that she presents to the emergency department.  She denies any recent illness.   Records reviewed. Per medical record review patient has a history of MS.  Past Medical History:  Diagnosis Date   Allergy    Aortic valve disease    Mild AS / AI - most recent Echo demonstrated tricuspid aortic valve.   Bacterial endocarditis    History of .   Bilateral lower extremity edema    Noncardiac.  Chronic. LE Venous dopplers - negative for DVT.; Echocardiogram January 2016: Normal EF with normal wall motion and valve function. Only grade 1 diastolic dysfunction. EF 60-65%. Mild MR   Breast cancer ([place]) 12/31/2013   Right breast, 12:00, 1.5 cm, T1c,N0 invasive mammary carcinoma, triple negative. --> Rx with Chemo   Cervical stenosis of spine    GERD (gastroesophageal reflux disease) April or May 2022   Heart murmur 2013   Herpes zoster    IBS (irritable bowel syndrome)    Lymphedema    has legs wrapped at [facility]   Multiple sclerosis ([place]) 2001   Walks from room to room @ home; but Wheelchair when going out.   Neuromuscular disorder ([facility])    MS   PONV (postoperative nausea and vomiting)    Related to Fentanyl   Seizures ([facility])    Takes Keppra   Syncope and collapse     Patient Active Problem List   Diagnosis Date Noted   Dysphagia    Acute esophagitis     Gastric erythema    Mucosal abnormality of duodenum    Schatzki's ring    Diarrhea 04/14/2020   Elevated BP without diagnosis of hypertension 04/14/2020   Urinary tract infection associated with indwelling urethral catheter ([place]) 02/17/2020   Muscle spasm 02/28/2019   Post-menopausal bleeding 11/20/2018   Ambulatory dysfunction 02/02/2018   Abnormality of gait 11/17/2017   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   Bilateral lower extremity pain (primary) (bilateral) (right greater than left) 08/01/2016   Chronic neck pain (secondary) (bilateral) ( left greater than right) 07/28/2016   Neutropenia ([place]) 06/27/2016   Chronic pain syndrome 06/03/2016   Neurogenic bladder    Neurogenic bowel    Detrusor and sphincter dyssynergia    Urinary incontinence    Seizure disorder ([place])    Slow transit constipation    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[place])    Lymphedema    Encounter for screening for cervical cancer 11/27/2015   Preventative health care 11/25/2015   History of breast cancer in female 10/28/2015   Anemia 10/28/2015   Thrombocytopenia ([place]) 10/28/2015   Allergic rhinitis 10/28/2015   IBS (irritable bowel syndrome) 10/28/2015   Uterine leiomyoma 10/24/2014   History of right mastectomy 10/24/2014   Medicare annual wellness visit, subsequent 10/24/2014   MS (multiple sclerosis) ([place]) [id number]   Hematoma complicating a procedure 03/06/2014   Primary cancer of  right female breast ([place]) 01/10/2014   SOB (shortness of breath) 03/01/2013   Bilateral lower extremity edema    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   CHOLECYSTECTOMY     COLONOSCOPY  2014   ESOPHAGOGASTRODUODENOSCOPY (EGD) WITH PROPOFOL N/A 07/16/2020   Procedure: ESOPHAGOGASTRODUODENOSCOPY (EGD) WITH PROPOFOL;  Surgeon: Virgel Manifold, MD;  Location: [facility];  Service: Endoscopy;  Laterality: N/A;  Patient has MS and will need assistance   HYSTEROSCOPY WITH D & C N/A 11/27/2018   Procedure: DILATATION AND CURETTAGE /HYSTEROSCOPY;  Surgeon: Gae Dry, MD;  Location: [facility];  Service: Gynecology;  Laterality: N/A;   Lower extremity venous Dopplers  02/27/2013   No LE DVT   MASTECTOMY Right 2015   Port a cath insertion Right 01/19/2010   PORT-A-CATH REMOVAL     right   PORT-A-CATH REMOVAL Right 09/03/2013   Procedure: REMOVAL PORT-A-CATH;  Surgeon: Conrad Ridgefield, MD;  Location: [place];  Service: Vascular;  Laterality: Right;   SPINE SURGERY  Cervical steinois Dr. Vertell Limber 2014 I think?   TONSILLECTOMY     TRANSTHORACIC ECHOCARDIOGRAM  03/2013; 02/2014   a) Normal LV size and function with EF 60-65%.; Cannot exclude bicuspid aortic valve with mild AS and mild AI.; b) Normal EF with normal wall motion and valve function x Mild MR. G2 DD. EF 60-65%. Tricuspid AoV   UPPER GI ENDOSCOPY  2014    Prior to Admission medications   Medication Sig Start Date End Date Taking? Authorizing Provider  amantadine (SYMMETREL) 100 MG capsule Take 1 capsule (100 mg total) by mouth 3 (three) times daily. 12/16/19   Penumalli, Earlean Polka, MD  baclofen (LIORESAL) 10 MG tablet TAKE 1 TO 2 TABLETS BY MOUTH 3 TIMES A DAY 10/30/19   Penumalli, Earlean Polka, MD  Cranberry 1000 MG CAPS Take by mouth.    [provider]  CREON 36000-114000 units CPEP capsule TAKE 1 CAPSULE (36,000 UNITS TOTAL) BY MOUTH WITH BREAKFAST, WITH LUNCH, AND WITH EVENING MEAL  AND 1 CAPSULE (36,000 UNITS TOTAL) WITH SNACKS. 09/09/20   Virgel Manifold, MD  Disposable Gloves (ASSURANCE VINYL EXAM GLOVES) MISC Neurogenic bladder; MS; LON 99 months; for use for personal hygiene 12/26/17   Lada, Satira Anis, MD  gluconic acid-citric acid (RENACIDIN) irrigation IRRIGATE WITH 30 MLS AS DIRECTED 3 (THREE) TIMES DAILY. 06/30/20 10/28/20  Vaillancourt, Aldona Bar, PA-C  Glucosamine-Chondroitin (COSAMIN DS PO) Take 1 tablet by mouth 2 (two) times daily.    [provider]  ibuprofen (ADVIL) 600 MG tablet TAKE 1 TABLET BY MOUTH EVERY 8 HOURS AS NEEDED. 07/27/20   Delsa Grana, PA-C  levETIRAcetam (KEPPRA) 500 MG tablet Take 1 tablet (500 mg total) by mouth 2 (two) times daily. 12/16/19   Penumalli, Earlean Polka, MD  lidocaine-prilocaine (EMLA) cream Apply 1 application topically as needed. 09/20/19   Lloyd Huger, MD  Misc Natural Products (LEG VEIN & CIRCULATION) TABS Take 1 tablet by mouth 2 (two)  times daily.     [provider]  Multiple Vitamins-Minerals (HAIR SKIN AND NAILS FORMULA) TABS Take 1 tablet by mouth 2 (two) times daily.     [provider]  Multiple Vitamins-Minerals (MULTIVITAMIN PO) Take 1 tablet by mouth daily.     [provider]  nystatin (MYCOSTATIN/NYSTOP) powder Apply 1 application topically 3 (three) times daily as needed. For rash/raw skin as needed 07/31/19   Delsa Grana, PA-C  omeprazole (PRILOSEC) 40 MG capsule TAKE 1 CAPSULE (40 MG TOTAL) BY MOUTH DAILY. 09/09/20 10/09/20  Virgel Manifold, MD  oxybutynin (DITROPAN-XL) 10 MG 24 hr tablet Take 1 tablet (10 mg total) by mouth 2 (two) times daily. 09/11/19   Penumalli, Earlean Polka, MD  REBIF REBIDOSE 44 MCG/0.5ML SOAJ Inject  44 mcg Sub-q 3 times weekly Rotate site after each injection 01/28/20   Penumalli, Vikram R, MD  tiZANidine (ZANAFLEX) 4 MG tablet TAKE 1 TABLET (4 MG TOTAL) BY MOUTH 2 (TWO) TIMES DAILY. 09/05/19   Jamse Arn, MD  Turmeric 500 MG CAPS Take 500 mg by  mouth daily.    [provider]    Allergies Fentanyl and Sulfa antibiotics  Family History  Problem Relation Age of Onset   Cancer Father        skin   Heart disease Father    Heart attack Father        heart attack in his 63's   Thyroid disease Sister    Ovarian cancer Cousin    Breast cancer Maternal Aunt 60   Breast cancer Maternal Grandmother 74   Bladder Cancer Neg Hx    Kidney cancer Neg Hx     Social History Social History   Tobacco Use   Smoking status: Never   Smokeless tobacco: Never   Tobacco comments:    Never smoked. My mom smoked. Passed 04/18/20. Dad smoked, quit 1973. Sister smokes.  Vaping Use   Vaping Use: Never used  Substance Use Topics   Alcohol use: No   Drug use: No    Review of Systems Constitutional: No fever/chills Eyes: No visual changes. ENT: No sore throat. Cardiovascular: Denies chest pain. Respiratory: Denies shortness of breath. Gastrointestinal: No abdominal pain.  No nausea, no vomiting.  No diarrhea.   Genitourinary: Positive for foley catheter being pulled out. Musculoskeletal: Negative for back pain. Skin: Negative for rash. Neurological: Negative for headaches, focal weakness or numbness.  ____________________________________________   PHYSICAL EXAM:  VITAL SIGNS: ED Triage Vitals  Enc Vitals Group     BP 169/89     Pulse 72     Resp 15     Temp 98.1     Temp src      SpO2 97   Constitutional: Alert and oriented.  Eyes: Conjunctivae are normal.  ENT      Head: Normocephalic and atraumatic.      Nose: No congestion/rhinnorhea.      Mouth/Throat: Mucous membranes are moist.      Neck: No stridor. Hematological/Lymphatic/Immunilogical: No cervical lymphadenopathy. Cardiovascular: Normal rate, regular rhythm.  No murmurs, rubs, or gallops.  Respiratory: Normal respiratory effort without tachypnea nor retractions. Breath sounds are clear and equal bilaterally. No wheezes/rales/rhonchi. Gastrointestinal:  Soft and non tender.  Musculoskeletal: No deformity to extremities.  Neurologic:  Awake, alert and oriented. Skin:  Skin is warm, dry and intact. No rash noted. Psychiatric: Mood and affect are normal. Speech and behavior are normal. Patient exhibits appropriate insight and judgment.  ____________________________________________  LABS (pertinent positives/negatives)  None  ____________________________________________   EKG  None  ____________________________________________    RADIOLOGY  None  ____________________________________________   PROCEDURES  Procedures  ____________________________________________   INITIAL IMPRESSION / ASSESSMENT AND PLAN / ED COURSE  Pertinent labs & imaging results that were available during my care of the patient were reviewed by me and considered in my medical decision making (see chart for details).   Patient presented to the emergency department today because of concerns for Foley catheter getting pulled out accidentally.  The patient had the Foley catheter replaced.   ____________________________________________   FINAL CLINICAL IMPRESSION(S) / ED DIAGNOSES  Final diagnoses:  Problem with Foley catheter, initial encounter [facility])     Note: This dictation was prepared with Dragon dictation. Any transcriptional errors that result from this process are unintentional     Nance Pear, MD 10/11/20 2021

## 2020-10-11 NOTE — ED Triage Notes (Signed)
Pt arrived via [facility] from home where she had her foley catheter come out this AM, no bleeding or pain post. Per pt, spouse flushed catheter this AM and noticed that the fluid was coming back out. Pt told by home health service that due to holiday weekend, no one available to come to home to replace catheter. Pt to ED to have her foley catheter replaced.

## 2020-10-19 ENCOUNTER — Other Ambulatory Visit: Payer: Self-pay | Admitting: Physical Medicine & Rehabilitation

## 2020-10-19 ENCOUNTER — Other Ambulatory Visit: Payer: Self-pay | Admitting: Gastroenterology

## 2020-10-21 ENCOUNTER — Telehealth: Payer: Self-pay | Admitting: Family Medicine

## 2020-10-21 NOTE — Telephone Encounter (Signed)
robin  called from [facility] to inform nurse that wound care left lower buttocks wound opened again and they are gonna use hydrosera for it

## 2020-10-23 ENCOUNTER — Other Ambulatory Visit: Payer: Self-pay | Admitting: Diagnostic Neuroimaging

## 2020-11-03 ENCOUNTER — Telehealth: Payer: Self-pay | Admitting: Gastroenterology

## 2020-11-03 NOTE — Telephone Encounter (Signed)
This was filled 9/15/20202 with 2 refills

## 2020-11-03 NOTE — Telephone Encounter (Signed)
Pt. Requesting refill for Pancrelipase (Creon) 100 capsules 36000 units.. Send to [facility]

## 2020-11-05 ENCOUNTER — Telehealth: Payer: Self-pay | Admitting: Diagnostic Neuroimaging

## 2020-11-05 NOTE — Telephone Encounter (Signed)
[place] DMV Disability placard form completed and put in outgoing mail to patient.

## 2020-11-05 NOTE — Telephone Encounter (Signed)
Noted  

## 2020-11-05 NOTE — Telephone Encounter (Signed)
Called patient and advised her Dr Leta Baptist can fill out a new application for disability placard. I can then mail it to her. She  verbalized understanding, appreciation. Application on MD desk for completion, signature.

## 2020-11-05 NOTE — Telephone Encounter (Signed)
..   Pt understands that although there may be some limitations with this type of visit, we will take all precautions to reduce any security or privacy concerns.  Pt understands that this will be treated like an in office visit and we will file with pt's insurance, and there may be a patient responsible charge related to this service. ? ?

## 2020-11-05 NOTE — Telephone Encounter (Signed)
Pt states she needs a letter from Dr Leta Baptist stating why she needs a new handicap placard, pt states hers expired on 09-22, please call

## 2020-11-18 ENCOUNTER — Other Ambulatory Visit: Payer: Self-pay | Admitting: Diagnostic Neuroimaging

## 2020-11-20 ENCOUNTER — Inpatient Hospital Stay: Payer: Medicare Other

## 2020-11-24 ENCOUNTER — Inpatient Hospital Stay: Payer: Medicare Other

## 2020-11-25 ENCOUNTER — Inpatient Hospital Stay: Payer: Medicare Other

## 2020-12-01 ENCOUNTER — Ambulatory Visit (INDEPENDENT_AMBULATORY_CARE_PROVIDER_SITE_OTHER): Payer: Medicare Other

## 2020-12-01 ENCOUNTER — Inpatient Hospital Stay: Payer: Medicare Other | Attending: Oncology

## 2020-12-01 ENCOUNTER — Other Ambulatory Visit: Payer: Self-pay

## 2020-12-01 DIAGNOSIS — Z853 Personal history of malignant neoplasm of breast: Secondary | ICD-10-CM | POA: Insufficient documentation

## 2020-12-01 DIAGNOSIS — Z452 Encounter for adjustment and management of vascular access device: Secondary | ICD-10-CM | POA: Diagnosis present

## 2020-12-01 DIAGNOSIS — Z23 Encounter for immunization: Secondary | ICD-10-CM | POA: Diagnosis not present

## 2020-12-01 DIAGNOSIS — Z95828 Presence of other vascular implants and grafts: Secondary | ICD-10-CM

## 2020-12-01 MED ORDER — HEPARIN SOD (PORK) LOCK FLUSH 100 UNIT/ML IV SOLN
500.0000 [IU] | Freq: Once | INTRAVENOUS | Status: AC
  Administered 2020-12-01: 500 [IU] via INTRAVENOUS
  Filled 2020-12-01: qty 5

## 2020-12-01 MED ORDER — SODIUM CHLORIDE 0.9% FLUSH
10.0000 mL | INTRAVENOUS | Status: DC | PRN
  Administered 2020-12-01: 10 mL via INTRAVENOUS
  Filled 2020-12-01: qty 10

## 2020-12-01 NOTE — Progress Notes (Signed)
Patient tolerated vaccine well with no adverse reactions immediatly noted. Patient expressed compliance to call our office or report to the nearest ED with any questions or concerns.

## 2020-12-07 ENCOUNTER — Emergency Department: Payer: Medicare Other

## 2020-12-07 ENCOUNTER — Other Ambulatory Visit: Payer: Self-pay

## 2020-12-07 DIAGNOSIS — S82142A Displaced bicondylar fracture of left tibia, initial encounter for closed fracture: Secondary | ICD-10-CM | POA: Diagnosis not present

## 2020-12-07 DIAGNOSIS — Z79899 Other long term (current) drug therapy: Secondary | ICD-10-CM | POA: Diagnosis not present

## 2020-12-07 DIAGNOSIS — S89202A Unspecified physeal fracture of upper end of left fibula, initial encounter for closed fracture: Secondary | ICD-10-CM | POA: Insufficient documentation

## 2020-12-07 DIAGNOSIS — Z20822 Contact with and (suspected) exposure to covid-19: Secondary | ICD-10-CM | POA: Insufficient documentation

## 2020-12-07 DIAGNOSIS — Z853 Personal history of malignant neoplasm of breast: Secondary | ICD-10-CM | POA: Diagnosis not present

## 2020-12-07 DIAGNOSIS — W010XXA Fall on same level from slipping, tripping and stumbling without subsequent striking against object, initial encounter: Secondary | ICD-10-CM | POA: Insufficient documentation

## 2020-12-07 DIAGNOSIS — Z9011 Acquired absence of right breast and nipple: Secondary | ICD-10-CM | POA: Diagnosis not present

## 2020-12-07 DIAGNOSIS — S82832A Other fracture of upper and lower end of left fibula, initial encounter for closed fracture: Secondary | ICD-10-CM

## 2020-12-07 DIAGNOSIS — Z01811 Encounter for preprocedural respiratory examination: Secondary | ICD-10-CM

## 2020-12-07 DIAGNOSIS — S8992XA Unspecified injury of left lower leg, initial encounter: Secondary | ICD-10-CM | POA: Diagnosis present

## 2020-12-07 LAB — CBC WITH DIFFERENTIAL/PLATELET
Abs Immature Granulocytes: 0.02 10*3/uL (ref 0.00–0.07)
Basophils Absolute: 0 10*3/uL (ref 0.0–0.1)
Basophils Relative: 0 %
Eosinophils Absolute: 0.1 10*3/uL (ref 0.0–0.5)
Eosinophils Relative: 2 %
HCT: 31.2 % — ABNORMAL LOW (ref 36.0–46.0)
Hemoglobin: 10.5 g/dL — ABNORMAL LOW (ref 12.0–15.0)
Immature Granulocytes: 0 %
Lymphocytes Relative: 23 %
Lymphs Abs: 1.4 10*3/uL (ref 0.7–4.0)
MCH: 30.4 pg (ref 26.0–34.0)
MCHC: 33.7 g/dL (ref 30.0–36.0)
MCV: 90.4 fL (ref 80.0–100.0)
Monocytes Absolute: 0.5 10*3/uL (ref 0.1–1.0)
Monocytes Relative: 9 %
Neutro Abs: 3.9 10*3/uL (ref 1.7–7.7)
Neutrophils Relative %: 66 %
Platelets: 138 10*3/uL — ABNORMAL LOW (ref 150–400)
RBC: 3.45 MIL/uL — ABNORMAL LOW (ref 3.87–5.11)
RDW: 13 % (ref 11.5–15.5)
WBC: 6 10*3/uL (ref 4.0–10.5)
nRBC: 0 % (ref 0.0–0.2)

## 2020-12-07 LAB — BASIC METABOLIC PANEL
Anion gap: 6 (ref 5–15)
BUN: 15 mg/dL (ref 6–20)
CO2: 28 mmol/L (ref 22–32)
Calcium: 9.2 mg/dL (ref 8.9–10.3)
Chloride: 104 mmol/L (ref 98–111)
Creatinine, Ser: 0.51 mg/dL (ref 0.44–1.00)
GFR, Estimated: >60 mL/min (ref >60)
Glucose, Bld: 114 mg/dL — ABNORMAL HIGH (ref 70–99)
Potassium: 3.8 mmol/L (ref 3.5–5.1)
Sodium: 138 mmol/L (ref 135–145)

## 2020-12-07 LAB — RESP PANEL BY RT-PCR (FLU A&B, COVID) ARPGX2
Influenza A by PCR: NEGATIVE
Influenza B by PCR: NEGATIVE
SARS Coronavirus 2 by RT PCR: NEGATIVE

## 2020-12-07 MED ORDER — LEVETIRACETAM 500 MG PO TABS
500.0000 mg | ORAL_TABLET | Freq: Two times a day (BID) | ORAL | Status: DC
  Administered 2020-12-08: 500 mg via ORAL
  Filled 2020-12-07: qty 1

## 2020-12-07 MED ORDER — BACLOFEN 10 MG PO TABS
10.0000 mg | ORAL_TABLET | Freq: Three times a day (TID) | ORAL | Status: DC
  Administered 2020-12-08: 10 mg via ORAL
  Filled 2020-12-07 (×4): qty 1

## 2020-12-07 MED ORDER — PANCRELIPASE (LIP-PROT-AMYL) 36000-114000 UNITS PO CPEP
36000.0000 [IU] | ORAL_CAPSULE | Freq: Three times a day (TID) | ORAL | Status: DC
  Administered 2020-12-08: 36000 [IU] via ORAL
  Filled 2020-12-07 (×3): qty 1

## 2020-12-07 MED ORDER — OXYBUTYNIN CHLORIDE ER 10 MG PO TB24
10.0000 mg | ORAL_TABLET | Freq: Two times a day (BID) | ORAL | Status: DC
  Administered 2020-12-08: 10 mg via ORAL
  Filled 2020-12-07: qty 1

## 2020-12-07 MED ORDER — AMANTADINE HCL 100 MG PO CAPS
100.0000 mg | ORAL_CAPSULE | Freq: Three times a day (TID) | ORAL | Status: DC
  Administered 2020-12-08: 100 mg via ORAL
  Filled 2020-12-07 (×4): qty 1

## 2020-12-07 NOTE — ED Notes (Signed)
Called [facility] for transfer spoke to [place]

## 2020-12-07 NOTE — ED Provider Notes (Signed)
[facility] Emergency Department Provider Note   ____________________________________________   None    (approximate)  I have reviewed the triage vital signs and the nursing notes.   HISTORY  Chief Complaint Leg Injury    HPI Rebecca [name] is a 60 y.o. female with past medical history of multiple sclerosis and seizure disorder who presents to the ED complaining of leg pain.  Patient reports that she had a trip and fall onto her left leg when transitioning from her chair to a bedside commode 4 days ago.  She states she is typically able to ambulate with a walker but has been unable to bear any weight on her left leg since then.  She has been dealing with pain in her left knee as well as her left ankle.  She denies hitting her head or losing consciousness.  She denies any pain in her chest, abdomen, or upper extremities.        Past Medical History:  Diagnosis Date   Allergy    Aortic valve disease    Mild AS / AI - most recent Echo demonstrated tricuspid aortic valve.   Bacterial endocarditis    History of .   Bilateral lower extremity edema    Noncardiac.  Chronic. LE Venous dopplers - negative for DVT.; Echocardiogram January 2016: Normal EF with normal wall motion and valve function. Only grade 1 diastolic dysfunction. EF 60-65%. Mild MR   Breast cancer ([place]) 12/31/2013   Right breast, 12:00, 1.5 cm, T1c,N0 invasive mammary carcinoma, triple negative. --> Rx with Chemo   Cervical stenosis of spine    GERD (gastroesophageal reflux disease) April or May 2022   Heart murmur 2013   Herpes zoster    IBS (irritable bowel syndrome)    Lymphedema    has legs wrapped at [facility]   Multiple sclerosis ([place]) 2001   Walks from room to room @ home; but Wheelchair when going out.   Neuromuscular disorder (HCC)    MS   PONV (postoperative nausea and vomiting)    Related to Fentanyl   Seizures (HCC)    Takes Keppra   Syncope and collapse     Patient Active  Problem List   Diagnosis Date Noted   Dysphagia    Acute esophagitis    Gastric erythema    Mucosal abnormality of duodenum    Schatzki's ring    Diarrhea 04/14/2020   Elevated BP without diagnosis of hypertension 04/14/2020   Urinary tract infection associated with indwelling urethral catheter ([place]) 02/17/2020   Muscle spasm 02/28/2019   Post-menopausal bleeding 11/20/2018   Ambulatory dysfunction 02/02/2018   Abnormality of gait 11/17/2017   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   Bilateral lower extremity pain (primary) (bilateral) (right greater than left) 08/01/2016   Chronic neck pain (secondary) (bilateral) ( left greater than right) 07/28/2016   Neutropenia ([place]) 06/27/2016   Chronic pain syndrome 06/03/2016   Neurogenic bladder    Neurogenic bowel    Detrusor and sphincter dyssynergia    Urinary incontinence    Seizure disorder ([place])    Slow transit constipation    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[place])    Lymphedema    Encounter for screening for cervical cancer 11/27/2015   Preventative health care 11/25/2015   History of breast cancer in female 10/28/2015   Anemia 10/28/2015   Thrombocytopenia ([place]) 10/28/2015   Allergic rhinitis 10/28/2015   IBS (irritable bowel syndrome) 10/28/2015   Uterine leiomyoma 10/24/2014  History of right mastectomy 10/24/2014   Medicare annual wellness visit, subsequent 10/24/2014   MS (multiple sclerosis) ([place]) 77/41/2878   Hematoma complicating a procedure 03/06/2014   Primary cancer of right female breast ([place]) 01/10/2014   SOB (shortness of breath) 03/01/2013   Bilateral lower extremity edema    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   CHOLECYSTECTOMY     COLONOSCOPY  2014   ESOPHAGOGASTRODUODENOSCOPY (EGD) WITH PROPOFOL N/A 07/16/2020   Procedure: ESOPHAGOGASTRODUODENOSCOPY (EGD) WITH PROPOFOL;  Surgeon: Virgel Manifold, MD;  Location: [facility] ENDOSCOPY;  Service: Endoscopy;  Laterality: N/A;  Patient has MS and will need assistance   HYSTEROSCOPY WITH D & C N/A 11/27/2018   Procedure: DILATATION AND CURETTAGE /HYSTEROSCOPY;  Surgeon: Gae Dry, MD;  Location: [facility] ORS;  Service: Gynecology;  Laterality: N/A;   Lower extremity venous Dopplers  02/27/2013   No LE DVT   MASTECTOMY Right 2015   Port a cath insertion Right 01/19/2010   PORT-A-CATH REMOVAL     right   PORT-A-CATH REMOVAL Right 09/03/2013   Procedure: REMOVAL PORT-A-CATH;  Surgeon: Conrad , MD;  Location: [place];  Service: Vascular;  Laterality: Right;   SPINE SURGERY  Cervical steinois Dr. Vertell Limber 2014 I think?   TONSILLECTOMY     TRANSTHORACIC ECHOCARDIOGRAM  03/2013; 02/2014   a) Normal LV size and function with EF 60-65%.; Cannot exclude bicuspid aortic valve with mild AS and mild AI.; b) Normal EF with normal wall motion and valve function x Mild MR. G2 DD. EF 60-65%. Tricuspid AoV   UPPER GI ENDOSCOPY  2014    Prior to Admission medications   Medication Sig Start Date End Date Taking? Authorizing Provider  amantadine (SYMMETREL) 100 MG capsule Take 1 capsule (100 mg total) by mouth 3 (three) times daily. 12/16/19   Penumalli, Earlean Polka, MD  baclofen (LIORESAL) 10 MG tablet TAKE 1 TO 2 TABLETS BY MOUTH 3 TIMES A DAY 10/26/20   Penumalli, Earlean Polka, MD  Cranberry 1000 MG CAPS Take by mouth.    [provider]  Disposable Gloves (ASSURANCE VINYL EXAM GLOVES) MISC  Neurogenic bladder; MS; LON 99 months; for use for personal hygiene 12/26/17   Lada, Satira Anis, MD  Glucosamine-Chondroitin (COSAMIN DS PO) Take 1 tablet by mouth 2 (two) times daily.    [provider]  ibuprofen (ADVIL) 600 MG tablet TAKE 1 TABLET BY MOUTH EVERY 8 HOURS AS NEEDED. 07/27/20   Delsa Grana, PA-C  levETIRAcetam (KEPPRA) 500 MG tablet Take 1 tablet (500 mg total) by mouth 2 (two) times daily. 12/16/19   Penumalli, Earlean Polka, MD  lidocaine-prilocaine (EMLA) cream Apply 1 application topically as needed. 09/20/19   Lloyd Huger, MD  lipase/protease/amylase (CREON) 36000 UNITS CPEP capsule TAKE 1 CAPSULE BY MOUTH WITH BREAKFAST, WITH LUNCH, AND WITH EVENING MEAL AND 1 CAPSULE WITH SNACKS. 10/22/20   Virgel Manifold, MD  Misc Natural Products (LEG VEIN & CIRCULATION) TABS Take 1  tablet by mouth 2 (two) times daily.     [provider]  Multiple Vitamins-Minerals (HAIR SKIN AND NAILS FORMULA) TABS Take 1 tablet by mouth 2 (two) times daily.     [provider]  Multiple Vitamins-Minerals (MULTIVITAMIN PO) Take 1 tablet by mouth daily.     [provider]  nystatin (MYCOSTATIN/NYSTOP) powder Apply 1 application topically 3 (three) times daily as needed. For rash/raw skin as needed 07/31/19   Delsa Grana, PA-C  omeprazole (PRILOSEC) 40 MG capsule TAKE 1 CAPSULE (40 MG TOTAL) BY MOUTH DAILY. 09/09/20 10/09/20  Virgel Manifold, MD  oxybutynin (DITROPAN-XL) 10 MG 24 hr tablet TAKE 1 TABLET BY MOUTH TWICE A DAY 11/18/20   Penumalli, Earlean Polka, MD  REBIF REBIDOSE 44 MCG/0.5ML SOAJ Inject  44 mcg Sub-q 3 times weekly Rotate site after each injection 01/28/20   Penumalli, Vikram R, MD  tiZANidine (ZANAFLEX) 4 MG tablet TAKE 1 TABLET (4 MG TOTAL) BY MOUTH 2 (TWO) TIMES DAILY. 09/05/19   Jamse Arn, MD  Turmeric 500 MG CAPS Take 500 mg by mouth daily.    [provider]    Allergies Fentanyl and Sulfa antibiotics  Family History  Problem  Relation Age of Onset   Cancer Father        skin   Heart disease Father    Heart attack Father        heart attack in his 27's   Thyroid disease Sister    Ovarian cancer Cousin    Breast cancer Maternal Aunt 60   Breast cancer Maternal Grandmother 74   Bladder Cancer Neg Hx    Kidney cancer Neg Hx     Social History Social History   Tobacco Use   Smoking status: Never   Smokeless tobacco: Never   Tobacco comments:    Never smoked. My mom smoked. Passed 04/18/20. Dad smoked, quit 1973. Sister smokes.  Vaping Use   Vaping Use: Never used  Substance Use Topics   Alcohol use: No   Drug use: No    Review of Systems  Constitutional: No fever/chills Eyes: No visual changes. ENT: No sore throat. Cardiovascular: Denies chest pain. Respiratory: Denies shortness of breath. Gastrointestinal: No abdominal pain.  No nausea, no vomiting.  No diarrhea.  No constipation. Genitourinary: Negative for dysuria. Musculoskeletal: Negative for back pain.  Positive for left knee and ankle pain. Skin: Negative for rash. Neurological: Negative for headaches, focal weakness or numbness.  ____________________________________________   PHYSICAL EXAM:  VITAL SIGNS: ED Triage Vitals  Enc Vitals Group     BP 12/07/20 1158 (!) 144/75     Pulse Rate 12/07/20 1158 87     Resp 12/07/20 1158 20     Temp 12/07/20 1158 98.4 F (36.9 C)     Temp Source 12/07/20 1158 Oral     SpO2 12/07/20 1158 97 %     Weight 12/07/20 1158 214 lb (97.1 kg)     Height 12/07/20 1158 5\' 4"  (1.626 m)     Head Circumference --      Peak Flow --      Pain Score 12/07/20 1310 8     Pain Loc --      Pain Edu? --      Excl. in [place]? --     Constitutional: Alert and oriented. Eyes: Conjunctivae are normal. Head: Atraumatic. Nose: No congestion/rhinnorhea. Mouth/Throat: Mucous membranes are moist. Neck: Normal ROM Cardiovascular: Normal rate, regular rhythm. Grossly normal heart sounds.  2+ DP pulses  bilaterally. Respiratory: Normal respiratory effort.  No retractions. Lungs CTAB. Gastrointestinal: Soft and nontender. No distention. Genitourinary: deferred Musculoskeletal: Diffuse tenderness to palpation of left knee with associated edema and ecchymosis.  Mild tenderness to palpation at left ankle with no associated deformity.  No tenderness to palpation of right lower extremity or bilateral upper extremities. Neurologic:  Normal speech and language. No gross focal neurologic deficits are appreciated. Skin:  Skin is warm, dry and intact. No rash noted. Psychiatric: Mood and affect are normal. Speech and behavior are normal.  ____________________________________________   LABS (all labs ordered are listed, but only abnormal results are displayed)  Labs Reviewed  CBC WITH DIFFERENTIAL/PLATELET - Abnormal; Notable for the following components:      Result Value   RBC 3.45 (*)    Hemoglobin 10.5 (*)    HCT 31.2 (*)    Platelets 138 (*)    All other components within normal limits  BASIC METABOLIC PANEL - Abnormal; Notable for the following components:   Glucose, Bld 114 (*)    All other components within normal limits  RESP PANEL BY RT-PCR (FLU A&B, COVID) [id number]   ____________________________________________  EKG  ED ECG REPORT I, Blake Divine, the attending physician, personally viewed and interpreted this ECG.   Date: 12/07/2020  EKG Time: 19:06  Rate: 89  Rhythm: normal EKG, normal sinus rhythm, unchanged from previous tracings  Axis: Normal  Intervals:none  ST&T Change: None    PROCEDURES  Procedure(s) performed (including Critical Care):  Procedures   ____________________________________________   INITIAL IMPRESSION / ASSESSMENT AND PLAN / ED COURSE      60 year old female with past medical history of MS and seizures who presents to the ED following mechanical fall 4 days ago, has been dealing with significant pain in her left knee since then and  inability to walk.  She is neurovascular intact to her left lower extremity.  X-rays of left knee reviewed by me and show comminuted fracture of her left proximal tibia as well as fibula.  Left ankle x-rays reviewed by me and show no fracture or dislocation.  Findings discussed with Dr. Harlow Mares of orthopedics, who recommends discussion with Ortho trauma at [facility] due to complexity of injury.  Case discussed with Dr. Alvan Dame of orthopedics, who agrees with plan for transfer to [facility] but requests admission to medical team given patient's comorbidities.  Case discussed with Dr. Roel Cluck of hospitalist service, who accepts patient for transfer.  Screening labs and EKG are unremarkable, patient reports pain is well controlled at this time.  She was placed in knee immobilizer per orthopedics.      ____________________________________________   FINAL CLINICAL IMPRESSION(S) / ED DIAGNOSES  Final diagnoses:  Closed fracture of left tibial plateau, initial encounter  Closed fracture of proximal end of left fibula, unspecified fracture morphology, initial encounter     ED Discharge Orders     None        Note:  This document was prepared using Dragon voice recognition software and may include unintentional dictation errors.    Blake Divine, MD 12/07/20 2042

## 2020-12-07 NOTE — ED Provider Notes (Signed)
Emergency Medicine Provider Triage Evaluation Note  Rebecca [name] , a 60 y.o. female  was evaluated in triage.  Pt complains of left knee and ankle pain following fall 4 days ago, has been unable to ambulate since then, ambulates with walker at baseline.  Review of Systems  Positive: Left knee pain, left ankle pain. Negative: Headache, neck pain, right leg pain.  Physical Exam  BP (!) 144/75 (BP Location: Left Arm)   Pulse 87   Temp 98.4 F (36.9 C) (Oral)   Resp 20   Ht 5\' 4"  (1.626 m)   Wt 97.1 kg   SpO2 97%   BMI 36.73 kg/m  Gen:   Awake, no distress Resp:  Normal effort, CTAB MSK:   Moves extremities without difficulty, 2+ DP pulses bilaterally, diffuse tenderness to left knee and ankle. No tenderness to right lower extremity or left hip.  Medical Decision Making  Medically screening exam initiated at 4:27 PM.  Appropriate orders placed.  Rebecca [name] was informed that the remainder of the evaluation will be completed by another provider, this initial triage assessment does not replace that evaluation, and the importance of remaining in the ED until their evaluation is complete.    Blake Divine, MD 12/07/20 2103

## 2020-12-07 NOTE — ED Triage Notes (Signed)
First nurse Note:  C/O left leg pain.  Per EMS report, fell while walking on Saturday, fell back onto left leg.  Arrives for evaluation of leg.  Per report, patient normally ambulates with walker.  Today, unable to stand/walk due to pain.

## 2020-12-07 NOTE — ED Notes (Signed)
Hospitalist and ortho called back pending labs for [facility]

## 2020-12-07 NOTE — ED Notes (Signed)
Called [facility] (Tammy) to repage hospitalist for [place], [place]

## 2020-12-07 NOTE — ED Notes (Signed)
Called [facility] (Tammy) to question bed assigned, per Tammy will probably be in AM. She states pt needs med-surg bed

## 2020-12-08 ENCOUNTER — Emergency Department
Admission: EM | Admit: 2020-12-08 | Discharge: 2020-12-08 | Disposition: A | Discharge status: Home / Self Care | Payer: Medicare Other | Attending: Emergency Medicine | Admitting: Emergency Medicine

## 2020-12-08 ENCOUNTER — Other Ambulatory Visit: Payer: Self-pay

## 2020-12-08 ENCOUNTER — Encounter (HOSPITAL_COMMUNITY): Payer: Self-pay | Admitting: Internal Medicine

## 2020-12-08 ENCOUNTER — Inpatient Hospital Stay (HOSPITAL_COMMUNITY)
Admission: AD | Admit: 2020-12-08 | Discharge: 2020-12-18 | DRG: 492 | Disposition: A | Discharge status: Skilled Nursing Facility | Payer: Medicare Other | Source: Other Acute Inpatient Hospital | Attending: Family Medicine | Admitting: Family Medicine

## 2020-12-08 DIAGNOSIS — Z885 Allergy status to narcotic agent status: Secondary | ICD-10-CM | POA: Diagnosis not present

## 2020-12-08 DIAGNOSIS — R609 Edema, unspecified: Secondary | ICD-10-CM | POA: Diagnosis not present

## 2020-12-08 DIAGNOSIS — Z8349 Family history of other endocrine, nutritional and metabolic diseases: Secondary | ICD-10-CM

## 2020-12-08 DIAGNOSIS — Z01811 Encounter for preprocedural respiratory examination: Secondary | ICD-10-CM

## 2020-12-08 DIAGNOSIS — G35 Multiple sclerosis: Secondary | ICD-10-CM | POA: Diagnosis present

## 2020-12-08 DIAGNOSIS — S82109A Unspecified fracture of upper end of unspecified tibia, initial encounter for closed fracture: Secondary | ICD-10-CM | POA: Diagnosis present

## 2020-12-08 DIAGNOSIS — Y92009 Unspecified place in unspecified non-institutional (private) residence as the place of occurrence of the external cause: Secondary | ICD-10-CM

## 2020-12-08 DIAGNOSIS — W1830XA Fall on same level, unspecified, initial encounter: Secondary | ICD-10-CM | POA: Diagnosis present

## 2020-12-08 DIAGNOSIS — M62838 Other muscle spasm: Secondary | ICD-10-CM | POA: Diagnosis not present

## 2020-12-08 DIAGNOSIS — N319 Neuromuscular dysfunction of bladder, unspecified: Secondary | ICD-10-CM | POA: Diagnosis present

## 2020-12-08 DIAGNOSIS — Z882 Allergy status to sulfonamides status: Secondary | ICD-10-CM | POA: Diagnosis not present

## 2020-12-08 DIAGNOSIS — K589 Irritable bowel syndrome without diarrhea: Secondary | ICD-10-CM | POA: Diagnosis present

## 2020-12-08 DIAGNOSIS — S82142A Displaced bicondylar fracture of left tibia, initial encounter for closed fracture: Secondary | ICD-10-CM

## 2020-12-08 DIAGNOSIS — I890 Lymphedema, not elsewhere classified: Secondary | ICD-10-CM | POA: Diagnosis not present

## 2020-12-08 DIAGNOSIS — Z79899 Other long term (current) drug therapy: Secondary | ICD-10-CM

## 2020-12-08 DIAGNOSIS — S82202A Unspecified fracture of shaft of left tibia, initial encounter for closed fracture: Secondary | ICD-10-CM | POA: Diagnosis present

## 2020-12-08 DIAGNOSIS — G40909 Epilepsy, unspecified, not intractable, without status epilepticus: Secondary | ICD-10-CM | POA: Diagnosis present

## 2020-12-08 DIAGNOSIS — Z8041 Family history of malignant neoplasm of ovary: Secondary | ICD-10-CM

## 2020-12-08 DIAGNOSIS — S82832A Other fracture of upper and lower end of left fibula, initial encounter for closed fracture: Secondary | ICD-10-CM | POA: Diagnosis present

## 2020-12-08 DIAGNOSIS — S82402A Unspecified fracture of shaft of left fibula, initial encounter for closed fracture: Secondary | ICD-10-CM | POA: Diagnosis not present

## 2020-12-08 DIAGNOSIS — Z981 Arthrodesis status: Secondary | ICD-10-CM

## 2020-12-08 DIAGNOSIS — Z853 Personal history of malignant neoplasm of breast: Secondary | ICD-10-CM | POA: Diagnosis not present

## 2020-12-08 DIAGNOSIS — Z8249 Family history of ischemic heart disease and other diseases of the circulatory system: Secondary | ICD-10-CM

## 2020-12-08 DIAGNOSIS — Z803 Family history of malignant neoplasm of breast: Secondary | ICD-10-CM | POA: Diagnosis not present

## 2020-12-08 DIAGNOSIS — U071 COVID-19: Secondary | ICD-10-CM | POA: Diagnosis not present

## 2020-12-08 DIAGNOSIS — R748 Abnormal levels of other serum enzymes: Secondary | ICD-10-CM

## 2020-12-08 DIAGNOSIS — L03116 Cellulitis of left lower limb: Secondary | ICD-10-CM | POA: Diagnosis not present

## 2020-12-08 DIAGNOSIS — D849 Immunodeficiency, unspecified: Secondary | ICD-10-CM | POA: Diagnosis present

## 2020-12-08 DIAGNOSIS — G35D Multiple sclerosis, unspecified: Secondary | ICD-10-CM | POA: Diagnosis present

## 2020-12-08 DIAGNOSIS — K219 Gastro-esophageal reflux disease without esophagitis: Secondary | ICD-10-CM | POA: Diagnosis present

## 2020-12-08 DIAGNOSIS — T148XXA Other injury of unspecified body region, initial encounter: Secondary | ICD-10-CM

## 2020-12-08 DIAGNOSIS — R262 Difficulty in walking, not elsewhere classified: Secondary | ICD-10-CM | POA: Diagnosis present

## 2020-12-08 DIAGNOSIS — E669 Obesity, unspecified: Secondary | ICD-10-CM | POA: Diagnosis present

## 2020-12-08 DIAGNOSIS — Z419 Encounter for procedure for purposes other than remedying health state, unspecified: Secondary | ICD-10-CM

## 2020-12-08 DIAGNOSIS — Z9011 Acquired absence of right breast and nipple: Secondary | ICD-10-CM

## 2020-12-08 DIAGNOSIS — L0390 Cellulitis, unspecified: Secondary | ICD-10-CM | POA: Diagnosis not present

## 2020-12-08 DIAGNOSIS — Z9889 Other specified postprocedural states: Secondary | ICD-10-CM | POA: Diagnosis not present

## 2020-12-08 DIAGNOSIS — K760 Fatty (change of) liver, not elsewhere classified: Secondary | ICD-10-CM | POA: Diagnosis present

## 2020-12-08 DIAGNOSIS — D62 Acute posthemorrhagic anemia: Secondary | ICD-10-CM | POA: Diagnosis not present

## 2020-12-08 DIAGNOSIS — Z6839 Body mass index (BMI) 39.0-39.9, adult: Secondary | ICD-10-CM

## 2020-12-08 DIAGNOSIS — R059 Cough, unspecified: Secondary | ICD-10-CM

## 2020-12-08 LAB — HEPATIC FUNCTION PANEL
ALT: 108 U/L — ABNORMAL HIGH (ref 0–44)
AST: 156 U/L — ABNORMAL HIGH (ref 15–41)
Albumin: 3.4 g/dL — ABNORMAL LOW (ref 3.5–5.0)
Alkaline Phosphatase: 131 U/L — ABNORMAL HIGH (ref 38–126)
Bilirubin, Direct: 0.3 mg/dL — ABNORMAL HIGH (ref 0.0–0.2)
Indirect Bilirubin: 0.8 mg/dL (ref 0.3–0.9)
Total Bilirubin: 1.1 mg/dL (ref 0.3–1.2)
Total Protein: 6.8 g/dL (ref 6.5–8.1)

## 2020-12-08 LAB — SURGICAL PCR SCREEN
MRSA, PCR: NEGATIVE
Staphylococcus aureus: POSITIVE — AB

## 2020-12-08 LAB — HIV ANTIBODY (ROUTINE TESTING W REFLEX): HIV Screen 4th Generation wRfx: NONREACTIVE

## 2020-12-08 MED ORDER — BISACODYL 5 MG PO TBEC
5.0000 mg | DELAYED_RELEASE_TABLET | Freq: Every day | ORAL | Status: DC | PRN
  Administered 2020-12-12 – 2020-12-15 (×3): 5 mg via ORAL
  Filled 2020-12-08 (×3): qty 1

## 2020-12-08 MED ORDER — METHOCARBAMOL 1000 MG/10ML IJ SOLN
500.0000 mg | Freq: Four times a day (QID) | INTRAVENOUS | Status: DC | PRN
  Filled 2020-12-08 (×3): qty 5

## 2020-12-08 MED ORDER — AMANTADINE HCL 100 MG PO CAPS
100.0000 mg | ORAL_CAPSULE | Freq: Three times a day (TID) | ORAL | Status: DC
  Administered 2020-12-08 – 2020-12-18 (×28): 100 mg via ORAL
  Filled 2020-12-08 (×32): qty 1

## 2020-12-08 MED ORDER — PANTOPRAZOLE SODIUM 40 MG IV SOLR
40.0000 mg | Freq: Once | INTRAVENOUS | Status: AC
  Administered 2020-12-08: 40 mg via INTRAVENOUS
  Filled 2020-12-08: qty 40

## 2020-12-08 MED ORDER — POLYETHYLENE GLYCOL 3350 17 G PO PACK
17.0000 g | PACK | Freq: Every day | ORAL | Status: DC
  Administered 2020-12-10 – 2020-12-18 (×9): 17 g via ORAL
  Filled 2020-12-08 (×10): qty 1

## 2020-12-08 MED ORDER — PANCRELIPASE (LIP-PROT-AMYL) 36000-114000 UNITS PO CPEP
36000.0000 [IU] | ORAL_CAPSULE | Freq: Three times a day (TID) | ORAL | Status: DC
  Administered 2020-12-08 – 2020-12-18 (×27): 36000 [IU] via ORAL
  Filled 2020-12-08 (×31): qty 1

## 2020-12-08 MED ORDER — POLYETHYLENE GLYCOL 3350 17 G PO PACK: 17.0000 g | PACK | Freq: Every day | ORAL | Status: DC | PRN

## 2020-12-08 MED ORDER — METHOCARBAMOL 500 MG PO TABS: 500.0000 mg | ORAL_TABLET | Freq: Four times a day (QID) | ORAL | Status: DC | PRN

## 2020-12-08 MED ORDER — MUPIROCIN 2 % EX OINT
1.0000 "application " | TOPICAL_OINTMENT | Freq: Two times a day (BID) | CUTANEOUS | Status: AC
  Administered 2020-12-09 – 2020-12-13 (×8): 1 via NASAL
  Filled 2020-12-08 (×2): qty 22

## 2020-12-08 MED ORDER — BACLOFEN 10 MG PO TABS
10.0000 mg | ORAL_TABLET | Freq: Three times a day (TID) | ORAL | Status: DC
  Administered 2020-12-08 – 2020-12-09 (×3): 10 mg via ORAL
  Filled 2020-12-08 (×3): qty 1

## 2020-12-08 MED ORDER — LORATADINE 10 MG PO TABS
10.0000 mg | ORAL_TABLET | Freq: Every day | ORAL | Status: DC
  Administered 2020-12-10 – 2020-12-18 (×9): 10 mg via ORAL
  Filled 2020-12-08 (×9): qty 1

## 2020-12-08 MED ORDER — COVID-19 MRNA VACC (MODERNA) 100 MCG/0.5ML IM SUSP: 0.5000 mL | Freq: Once | INTRAMUSCULAR | Status: DC

## 2020-12-08 MED ORDER — COVID-19MRNA BIVAL VAC MODERNA 50 MCG/0.5ML IM SUSP
0.5000 mL | Freq: Once | INTRAMUSCULAR | Status: AC
  Filled 2020-12-08: qty 0.5

## 2020-12-08 MED ORDER — OXYBUTYNIN CHLORIDE ER 10 MG PO TB24
10.0000 mg | ORAL_TABLET | Freq: Two times a day (BID) | ORAL | Status: DC
  Administered 2020-12-08 – 2020-12-18 (×19): 10 mg via ORAL
  Filled 2020-12-08 (×21): qty 1

## 2020-12-08 MED ORDER — MORPHINE SULFATE (PF) 2 MG/ML IV SOLN: 2.0000 mg | INTRAVENOUS | Status: DC | PRN

## 2020-12-08 MED ORDER — CHLORHEXIDINE GLUCONATE CLOTH 2 % EX PADS
6.0000 | MEDICATED_PAD | Freq: Every day | CUTANEOUS | Status: AC
  Administered 2020-12-09 – 2020-12-13 (×4): 6 via TOPICAL

## 2020-12-08 MED ORDER — DOCUSATE SODIUM 100 MG PO CAPS
100.0000 mg | ORAL_CAPSULE | Freq: Two times a day (BID) | ORAL | Status: DC
  Administered 2020-12-08: 100 mg via ORAL
  Filled 2020-12-08: qty 1

## 2020-12-08 MED ORDER — HYDROCODONE-ACETAMINOPHEN 5-325 MG PO TABS
1.0000 | ORAL_TABLET | Freq: Four times a day (QID) | ORAL | Status: DC | PRN
  Administered 2020-12-08: 2 via ORAL
  Filled 2020-12-08: qty 2

## 2020-12-08 MED ORDER — TIZANIDINE HCL 4 MG PO TABS
4.0000 mg | ORAL_TABLET | Freq: Two times a day (BID) | ORAL | Status: DC
  Administered 2020-12-08 – 2020-12-09 (×2): 4 mg via ORAL
  Filled 2020-12-08 (×2): qty 1

## 2020-12-08 MED ORDER — LEVETIRACETAM 500 MG PO TABS
500.0000 mg | ORAL_TABLET | Freq: Two times a day (BID) | ORAL | Status: DC
  Administered 2020-12-08 – 2020-12-18 (×19): 500 mg via ORAL
  Filled 2020-12-08 (×19): qty 1

## 2020-12-08 MED ORDER — MORPHINE SULFATE (PF) 4 MG/ML IV SOLN
4.0000 mg | Freq: Once | INTRAVENOUS | Status: AC
Start: 2020-12-08 — End: 2020-12-08
  Administered 2020-12-08: 4 mg via INTRAVENOUS
  Filled 2020-12-08: qty 1

## 2020-12-08 MED ORDER — PANTOPRAZOLE SODIUM 40 MG PO TBEC
40.0000 mg | DELAYED_RELEASE_TABLET | Freq: Every day | ORAL | Status: DC
  Administered 2020-12-10 – 2020-12-18 (×9): 40 mg via ORAL
  Filled 2020-12-08 (×9): qty 1

## 2020-12-08 NOTE — ED Notes (Signed)
Attempted to call report to [phone], spoke with 5 Architectural technologist, Pharmacist, community is taking pt. Multiple transfers attempted, unit secretary states RN is not picking up her phone,. This RN gave Financial controller her call back number, requesting receiving RN call back when she has time to take report.

## 2020-12-08 NOTE — Plan of Care (Signed)
  Problem: Education: Goal: Knowledge of General Education information will improve Description Including pain rating scale, medication(s)/side effects and non-pharmacologic comfort measures Outcome: Progressing   

## 2020-12-08 NOTE — Plan of Care (Incomplete)

## 2020-12-08 NOTE — H&P (Signed)
History and Physical    Rebecca [name] UEA:[id number] DOB: 12-22-1960 DOA: 12/08/2020  PCP: Delsa Grana, PA-C Consultants:  [facility] - neurology; [facility] - GI; Erlene Quan - urology; Grayland Ormond - oncology Patient coming from:  Home - lives with husband; NOK:  [place], [place], [phone]   Chief Complaint: Fall  HPI: Rebecca [name] is a 60 y.o. female with medical history significant of endocarditis with resultant mild AS; breast cancer (2015); MS; and seizure d/o presenting with a fall.  Saturday, she got up from her potty chair and attempted to move to the lift chair with her walker.  It was only a few steps.  She turned around and couldn't move her left leg - it was stuck on the carpet.  She twisted awkwardly and she tried to sit on the floor and her leg snapped in half.  He straightened her leg and EMS lifted her back into the lift chair but she declined transport.  She wasn't able to get back up.  EMS came again yesterday and she couldn't stand at all and she agreed to go to the hospital.    ED Course: [facility] transfer, per Dr. Roel Cluck:  Mechanical fall 4 days ago, resulting in severe pain left knee at baseline able to transfer only imaging showed comminuted fracture of her left proximal tibia as well as fibula. Orthopedics at [facility] deffered to [facility] orthopedics due to complexity Dr. Alvan Dame is aware and agrees for transfer for Dr. Marcelino Scot to see in AM and decide on approach pt not on anticoagulation otherwise stable COVID neg  Review of Systems: As per HPI; otherwise review of systems reviewed and negative.   Ambulatory Status:  Ambulates very short distances with a walker  COVID Vaccine Status:  Complete  Past Medical History:  Diagnosis Date   Allergy    Aortic valve disease    Mild AS / AI - most recent Echo demonstrated tricuspid aortic valve.   Bacterial endocarditis    History of .   Bilateral lower extremity edema    Noncardiac.  Chronic. LE Venous dopplers - negative  for DVT.; Echocardiogram January 2016: Normal EF with normal wall motion and valve function. Only grade 1 diastolic dysfunction. EF 60-65%. Mild MR   Breast cancer ([place]) 12/31/2013   Right breast, 12:00, 1.5 cm, T1c,N0 invasive mammary carcinoma, triple negative. --> Rx with Chemo   Cervical stenosis of spine    GERD (gastroesophageal reflux disease) April or May 2022   Heart murmur 2013   Herpes zoster    IBS (irritable bowel syndrome)    Lymphedema    has legs wrapped at [facility]   Multiple sclerosis ([place]) 2001   Walks from room to room @ home; but Wheelchair when going out.   Neuromuscular disorder ([place])    MS   PONV (postoperative nausea and vomiting)    Related to Fentanyl   Seizures ([place])    Takes Keppra   Syncope and collapse     Past Surgical History:  Procedure Laterality Date   ANKLE SURGERY     Left   ANKLE SURGERY     ANTERIOR CERVICAL DECOMP/DISCECTOMY FUSION  11/17/2011   Procedure: ANTERIOR CERVICAL DECOMPRESSION/DISCECTOMY FUSION 2 LEVELS;  Surgeon: Erline Levine, MD;  Location: [place] NEURO ORS;  Service: Neurosurgery;  Laterality: N/A;  Cervical Five-Six Six-Seven Anterior cervical decompression/diskectomy/fusion   BREAST BIOPSY Right 12/31/2013   invasive mammary   BREAST SURGERY Right 02/03/2014   Right simple mastectomy with sentinel node biopsy.  CHOLECYSTECTOMY     COLONOSCOPY  2014   ESOPHAGOGASTRODUODENOSCOPY (EGD) WITH PROPOFOL N/A 07/16/2020   Procedure: ESOPHAGOGASTRODUODENOSCOPY (EGD) WITH PROPOFOL;  Surgeon: Virgel Manifold, MD;  Location: [facility] ENDOSCOPY;  Service: Endoscopy;  Laterality: N/A;  Patient has MS and will need assistance   HYSTEROSCOPY WITH D & C N/A 11/27/2018   Procedure: DILATATION AND CURETTAGE /HYSTEROSCOPY;  Surgeon: Gae Dry, MD;  Location: [facility] ORS;  Service: Gynecology;  Laterality: N/A;   Lower extremity venous Dopplers  02/27/2013   No LE DVT   MASTECTOMY Right 2015   Port a cath insertion Right 01/19/2010    PORT-A-CATH REMOVAL     right   PORT-A-CATH REMOVAL Right 09/03/2013   Procedure: REMOVAL PORT-A-CATH;  Surgeon: Conrad Veteran, MD;  Location: [place];  Service: Vascular;  Laterality: Right;   SPINE SURGERY  Cervical steinois Dr. Vertell Limber 2014 I think?   TONSILLECTOMY     TRANSTHORACIC ECHOCARDIOGRAM  03/2013; 02/2014   a) Normal LV size and function with EF 60-65%.; Cannot exclude bicuspid aortic valve with mild AS and mild AI.; b) Normal EF with normal wall motion and valve function x Mild MR. G2 DD. EF 60-65%. Tricuspid AoV   UPPER GI ENDOSCOPY  2014    Social History   Socioeconomic History   Marital status: Married    Spouse name: don   Number of children: 0   Years of education: 12   Highest education level: Not on file  Occupational History   Occupation: disability  Tobacco Use   Smoking status: Never   Smokeless tobacco: Never   Tobacco comments:    Never smoked. My mom smoked. Passed 04/18/20. Dad smoked, quit 1973. Sister smokes.  Vaping Use   Vaping Use: Never used  Substance and Sexual Activity   Alcohol use: No   Drug use: No   Sexual activity: Not Currently    Partners: Male    Birth control/protection: None  Other Topics Concern   Not on file  Social History Narrative   She is married. Recently moved back to [place] after being in [place] for some time. She is accompanied by her husband and aunt.   Never smoked. Never used alcohol.   Social Determinants of Health   Financial Resource Strain: Low Risk    Difficulty of Paying Living Expenses: Not hard at all  Food Insecurity: No Food Insecurity   Worried About Charity fundraiser in the Last Year: Never true   [place] in the Last Year: Never true  Transportation Needs: No Transportation Needs   Lack of Transportation (Medical): No   Lack of Transportation (Non-Medical): No  Physical Activity: Inactive   Days of Exercise per Week: 0 days   Minutes of Exercise per Session: 0 min  Stress: No  Stress Concern Present   Feeling of Stress : Not at all  Social Connections: Moderately Isolated   Frequency of Communication with Friends and Family: More than three times a week   Frequency of Social Gatherings with Friends and Family: Once a week   Attends Religious Services: Never   Marine scientist or Organizations: No   Attends Music therapist: Never   Marital Status: Married  Human resources officer Violence: Not At Risk   Fear of Current or Ex-Partner: No   Emotionally Abused: No   Physically Abused: No   Sexually Abused: No    Allergies  Allergen Reactions   Fentanyl Nausea And Vomiting  and Nausea Only    vomiting Was given in PACU x3 each time patient got sick    Sulfa Antibiotics Hives and Other (See Comments)    Light headed, over heated    Family History  Problem Relation Age of Onset   Cancer Father        skin   Heart disease Father    Heart attack Father        heart attack in his 37's   Thyroid disease Sister    Ovarian cancer Cousin    Breast cancer Maternal Aunt 60   Breast cancer Maternal Grandmother 74   Bladder Cancer Neg Hx    Kidney cancer Neg Hx     Prior to Admission medications   Medication Sig Start Date End Date Taking? Authorizing Provider  amantadine (SYMMETREL) 100 MG capsule Take 1 capsule (100 mg total) by mouth 3 (three) times daily. 12/16/19  Yes Penumalli, Earlean Polka, MD  baclofen (LIORESAL) 10 MG tablet TAKE 1 TO 2 TABLETS BY MOUTH 3 TIMES A DAY Patient taking differently: 10-20 mg 3 (three) times daily. 10/26/20  Yes Penumalli, Earlean Polka, MD  Glucosamine-Chondroitin (COSAMIN DS PO) Take 1 tablet by mouth 5 (five) times daily.   Yes [provider]  ibuprofen (ADVIL) 600 MG tablet TAKE 1 TABLET BY MOUTH EVERY 8 HOURS AS NEEDED. Patient taking differently: Take 600 mg by mouth every 8 (eight) hours as needed for moderate pain, mild pain or headache. 07/27/20  Yes Delsa Grana, PA-C  levETIRAcetam (KEPPRA) 500 MG  tablet Take 1 tablet (500 mg total) by mouth 2 (two) times daily. 12/16/19  Yes Penumalli, Earlean Polka, MD  lidocaine-prilocaine (EMLA) cream Apply 1 application topically as needed. 09/20/19  Yes Lloyd Huger, MD  lipase/protease/amylase (CREON) 36000 UNITS CPEP capsule TAKE 1 CAPSULE BY MOUTH WITH BREAKFAST, WITH LUNCH, AND WITH EVENING MEAL AND 1 CAPSULE WITH SNACKS. Patient taking differently: Take 36,000 Units by mouth 3 (three) times daily with meals. 10/22/20  Yes Virgel Manifold, MD  loratadine (CLARITIN) 10 MG tablet Take 10 mg by mouth daily.   Yes [provider]  Misc Natural Products (LEG VEIN & CIRCULATION) TABS Take 1 tablet by mouth 2 (two) times daily.    Yes [provider]  Multiple Vitamins-Minerals (HAIR SKIN AND NAILS FORMULA) TABS Take 1 tablet by mouth 2 (two) times daily.    Yes [provider]  Multiple Vitamins-Minerals (MULTIVITAMIN PO) Take 1 tablet by mouth daily.    Yes [provider]  nystatin (MYCOSTATIN/NYSTOP) powder Apply 1 application topically 3 (three) times daily as needed. For rash/raw skin as needed 07/31/19  Yes Delsa Grana, PA-C  omeprazole (PRILOSEC) 40 MG capsule TAKE 1 CAPSULE (40 MG TOTAL) BY MOUTH DAILY. Patient taking differently: Take 40 mg by mouth daily. . 09/09/20 12/08/20 Yes Virgel Manifold, MD  oxybutynin (DITROPAN-XL) 10 MG 24 hr tablet TAKE 1 TABLET BY MOUTH TWICE A DAY Patient taking differently: Take 10 mg by mouth in the morning and at bedtime. 11/18/20  Yes Penumalli, Earlean Polka, MD  polyethylene glycol (MIRALAX / GLYCOLAX) 17 g packet Take 17 g by mouth daily.   Yes [provider]  REBIF REBIDOSE 44 MCG/0.5ML SOAJ Inject  44 mcg Sub-q 3 times weekly Rotate site after each injection Patient taking differently: Inject 44 mcg into the skin 3 (three) times a week. Rotate site after each injection 01/28/20  Yes Penumalli, Earlean Polka, MD  tiZANidine (ZANAFLEX) 4 MG tablet  TAKE 1 TABLET (4 MG  TOTAL) BY MOUTH 2 (TWO) TIMES DAILY. Patient taking differently: Take 4 mg by mouth in the morning and at bedtime. 09/05/19  Yes Jamse Arn, MD  Turmeric 500 MG CAPS Take 500 mg by mouth daily.   Yes [provider]  Disposable Gloves (ASSURANCE VINYL EXAM GLOVES) MISC Neurogenic bladder; MS; LON 99 months; for use for personal hygiene 12/26/17   [name], MD    Physical Exam: Vitals:   12/08/20 1536 12/08/20 1628  BP: (!) 155/81 (!) 155/81  Pulse: (!) 108 (!) 108  Resp: 19 19  Temp: 98.6 F (37 C) 98.6 F (37 C)  TempSrc: Oral Oral  SpO2: 98%   Weight:  97.1 kg  Height:  5\' 4"  (1.626 m)     General:  Appears calm and comfortable and is in NAD Eyes:  PERRL, EOMI, normal lids, iris ENT:  grossly normal hearing, lips & tongue, mmm; suboptimal dentition Neck:  no LAD, masses or thyromegaly Cardiovascular:  RRR, no r/g.  Respiratory:   CTA bilaterally with no wheezes/rales/rhonchi.  Normal respiratory effort. Abdomen:  soft, NT, ND Skin:  no rash or induration seen on limited exam Musculoskeletal:  L knee immobilizer and ACE wrap in place Lower extremity:  2+ distal pulses. Psychiatric:  grossly normal mood and affect, speech fluent and appropriate, AOx3 Neurologic:  CN 2-12 grossly intact    Radiological Exams on Admission: Independently reviewed - see discussion in A/P where applicable  DG Chest 1 View  Result Date: 12/07/2020 CLINICAL DATA:  Preop EXAM: CHEST  1 VIEW COMPARISON:  Chest x-ray 03/13/2014, chest x-ray 04/06/2014. FINDINGS: Left chest wall Port-A-Cath with tip overlying the superior cavoatrial junction. The heart and mediastinal contours are within normal limits. Left base atelectasis. No focal consolidation. No pulmonary edema. No pleural effusion. No pneumothorax. No acute osseous abnormality. IMPRESSION: No active disease. Electronically Signed   By: Iven Finn M.D.   On: 12/07/2020 19:09   DG Ankle Complete Left  Result Date:  12/07/2020 CLINICAL DATA:  Left ankle pain after fall. EXAM: LEFT ANKLE COMPLETE - 3+ VIEW COMPARISON:  None. FINDINGS: The bones are diffusely under mineralized. No obvious fracture. There is equivocal widening of the lateral clear space, alternatively this may be due to positioning. Subcortical cystic change in the lateral talar dome, chronic. Mild degenerative distal tibial spurring. Moderate midfoot dorsal spurring. Fragmented plantar calcaneal spur. Generalized soft tissue edema. IMPRESSION: 1. Osteopenia/osteoporosis without obvious fracture. 2. Equivocal widening of the lateral clear clear space versus positioning. 3. 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 Subchondral cyst in the lateral talar dome. 4. Fragmented plantar calcaneal spur. Electronically Signed   By: [name] Rake M.D.   On: 12/07/2020 17:17   CT Knee Left Wo Contrast  Result Date: 12/07/2020 CLINICAL DATA:  Left leg pain after fall. EXAM: CT OF THE left KNEE WITHOUT CONTRAST TECHNIQUE: Multidetector CT imaging of the left knee was performed according to the standard protocol. Multiplanar CT image reconstructions were also generated. COMPARISON:  Radiographs of same day. FINDINGS: Severely displaced and comminuted fracture is seen involving the proximal left tibia, which appears to extend to the articular surface of the lateral tibial plateau. Also noted is moderately displaced and comminuted proximal left fibular fracture. No significant joint effusion is noted. Moderate degenerative joint disease of the patellofemoral space is noted with osteophyte formation. IMPRESSION: Severely displaced and comminuted proximal left tibial fracture, which appears to extend to the articular surface  of the lateral tibial plateau. Also noted is moderately displaced and comminuted proximal left fibular fracture. Electronically Signed   By: Marijo Conception M.D.   On: 12/07/2020 17:25   DG Knee Complete 4 Views Left  Result Date:  12/07/2020 CLINICAL DATA:  Left leg pain.  Fall. EXAM: LEFT KNEE - COMPLETE 4+ VIEW COMPARISON:  None. FINDINGS: Comminuted proximal tibia and fibular fractures are present. Fracture extends to the lateral tibial plateau. Femur is intact. Joint effusion is noted. IMPRESSION: 1. Comminuted proximal tibia and fibular fractures. 2. Joint effusion. Electronically Signed   By: San Morelle M.D.   On: 12/07/2020 14:28    EKG: Independently reviewed.  NSR with rate 89;  no evidence of acute ischemia   Labs on Admission: I have personally reviewed the available labs and imaging studies at the time of the admission.  Pertinent labs:   Glucose 114 AP 131 Albumin 3.4 AST 156/ALT 108 Unremarkable CBC COVID/flu negative   Assessment/Plan Principal Problem:   Tibia/fibula fracture, left, closed, initial encounter Active Problems:   MS (multiple sclerosis) (HCC)   Seizure disorder (HCC)   Neurogenic bladder   Ambulatory dysfunction   L tib/fib fracture -Mechanical fall resulting in extensive L tib/fib fracture -Orthopedics consulted and will see in AM -NPO after midnight in anticipation of surgical repair tomorrow by Dr. Doreatha Martin -SCDs overnight, start Lovenox post-operatively (or as per ortho) -Pain control with Robxain, Vicodin, and Morphine prn -TOC team consult for rehab placement vs. HH assistance -Will need PT consult post-operatively  Pre-operative stratification -Orthopedic/spinal surgery is associated with an intermediate (1-5%) cardiovascular risk for cardiac death and nonfatal MI -Her revised cardiac index gives a risk estimate of 3.9% -No further testing appears to be needed prior to surgery -It is reasonable for her to go to the OR without additional evaluation  MS -She is minimally ambulatory at baseline -Continue amantadine, Baclofen, Zanaflex -She is on Rebif 3x/week  Seizure d/o -Continue Keppra  Neurogenic bladder -Has an indwelling foley -Continue  Ditropan  Obesity -Body mass index is 36.73 kg/m..  -Weight loss should be encouraged -Outpatient PCP/bariatric medicine f/u encouraged      DVT prophylaxis:  SCDs until approved for Lovenox by orthopedics Code Status:  Full - confirmed with patient/family Family Communication: None present  Disposition Plan:  to be determined Consults called: Orthopedics; TOC team, Nutrition; will need PT post-operatively  Admission status: Admit - It is my clinical opinion that admission to INPATIENT is reasonable and necessary because of the expectation that this patient will require hospital care that crosses at least 2 midnights to treat this condition based on the medical complexity of the problems presented.  Given the aforementioned information, the predictability of an adverse outcome is felt to be significant.       Karmen Bongo MD [facility]   How to contact the [facility] Attending or Consulting provider [place] or covering provider during after hours [place], for this patient?  Check the care team in [facility] and look for a) attending/consulting TRH provider listed and b) the [facility] team listed Log into [email or web address] and use [facility] universal password to access. If you do not have the password, please contact the hospital operator. Locate the [facility] provider you are looking for under [facility] and page to a number that you can be directly reached. If you still have difficulty reaching the provider, please page the [facility] (Director on Call) for the Hospitalists listed on amion for assistance.  12/08/2020, 6:20 PM

## 2020-12-09 ENCOUNTER — Inpatient Hospital Stay (HOSPITAL_COMMUNITY): Payer: Medicare Other | Admitting: Certified Registered Nurse Anesthetist

## 2020-12-09 ENCOUNTER — Inpatient Hospital Stay (HOSPITAL_COMMUNITY): Payer: Medicare Other

## 2020-12-09 ENCOUNTER — Encounter (HOSPITAL_COMMUNITY)
Admission: AD | Disposition: A | Discharge status: Skilled Nursing Facility | Payer: Self-pay | Source: Other Acute Inpatient Hospital | Attending: Internal Medicine

## 2020-12-09 ENCOUNTER — Encounter (HOSPITAL_COMMUNITY): Payer: Self-pay | Admitting: Internal Medicine

## 2020-12-09 DIAGNOSIS — S82202A Unspecified fracture of shaft of left tibia, initial encounter for closed fracture: Secondary | ICD-10-CM | POA: Diagnosis not present

## 2020-12-09 DIAGNOSIS — S82402A Unspecified fracture of shaft of left fibula, initial encounter for closed fracture: Secondary | ICD-10-CM | POA: Diagnosis not present

## 2020-12-09 HISTORY — PX: ORIF TIBIA PLATEAU: SHX2132

## 2020-12-09 LAB — VITAMIN D 25 HYDROXY (VIT D DEFICIENCY, FRACTURES): Vit D, 25-Hydroxy: 28.25 ng/mL — ABNORMAL LOW (ref 30–100)

## 2020-12-09 LAB — CBC
HCT: 24.9 % — ABNORMAL LOW (ref 36.0–46.0)
Hemoglobin: 8.2 g/dL — ABNORMAL LOW (ref 12.0–15.0)
MCH: 29.8 pg (ref 26.0–34.0)
MCHC: 32.9 g/dL (ref 30.0–36.0)
MCV: 90.5 fL (ref 80.0–100.0)
Platelets: 121 10*3/uL — ABNORMAL LOW (ref 150–400)
RBC: 2.75 MIL/uL — ABNORMAL LOW (ref 3.87–5.11)
RDW: 12.9 % (ref 11.5–15.5)
WBC: 4.7 10*3/uL (ref 4.0–10.5)
nRBC: 0 % (ref 0.0–0.2)

## 2020-12-09 LAB — BASIC METABOLIC PANEL WITH GFR
Anion gap: 4 — ABNORMAL LOW (ref 5–15)
BUN: 12 mg/dL (ref 6–20)
CO2: 26 mmol/L (ref 22–32)
Calcium: 8.2 mg/dL — ABNORMAL LOW (ref 8.9–10.3)
Chloride: 103 mmol/L (ref 98–111)
Creatinine, Ser: 0.52 mg/dL (ref 0.44–1.00)
GFR, Estimated: >60 mL/min
Glucose, Bld: 118 mg/dL — ABNORMAL HIGH (ref 70–99)
Potassium: 3.6 mmol/L (ref 3.5–5.1)
Sodium: 133 mmol/L — ABNORMAL LOW (ref 135–145)

## 2020-12-09 LAB — TYPE AND SCREEN
ABO/RH(D): O POS
Antibody Screen: NEGATIVE

## 2020-12-09 SURGERY — OPEN REDUCTION INTERNAL FIXATION (ORIF) TIBIAL PLATEAU
Anesthesia: General | Laterality: Left

## 2020-12-09 MED ORDER — FENTANYL CITRATE (PF) 250 MCG/5ML IJ SOLN
INTRAMUSCULAR | Status: AC
  Filled 2020-12-09: qty 5

## 2020-12-09 MED ORDER — HYDROMORPHONE HCL 1 MG/ML IJ SOLN
INTRAMUSCULAR | Status: AC
  Filled 2020-12-09: qty 0.5

## 2020-12-09 MED ORDER — METOCLOPRAMIDE HCL 5 MG/ML IJ SOLN
5.0000 mg | Freq: Three times a day (TID) | INTRAMUSCULAR | Status: DC | PRN
  Administered 2020-12-09: 10 mg via INTRAVENOUS
  Filled 2020-12-09: qty 2

## 2020-12-09 MED ORDER — SUGAMMADEX SODIUM 200 MG/2ML IV SOLN
INTRAVENOUS | Status: DC | PRN
  Administered 2020-12-09: 200 mg via INTRAVENOUS

## 2020-12-09 MED ORDER — PHENYLEPHRINE HCL-NACL 20-0.9 MG/250ML-% IV SOLN
INTRAVENOUS | Status: DC | PRN
  Administered 2020-12-09: 25 ug/min via INTRAVENOUS

## 2020-12-09 MED ORDER — DOCUSATE SODIUM 100 MG PO CAPS
100.0000 mg | ORAL_CAPSULE | Freq: Two times a day (BID) | ORAL | Status: DC
  Administered 2020-12-09 – 2020-12-18 (×17): 100 mg via ORAL
  Filled 2020-12-09 (×18): qty 1

## 2020-12-09 MED ORDER — ROCURONIUM BROMIDE 10 MG/ML (PF) SYRINGE
PREFILLED_SYRINGE | INTRAVENOUS | Status: AC
  Filled 2020-12-09: qty 10

## 2020-12-09 MED ORDER — CEFAZOLIN SODIUM 1 G IJ SOLR
INTRAMUSCULAR | Status: AC
  Filled 2020-12-09: qty 20

## 2020-12-09 MED ORDER — SODIUM CHLORIDE (PF) 0.9 % IJ SOLN
INTRAMUSCULAR | Status: AC
  Filled 2020-12-09: qty 10

## 2020-12-09 MED ORDER — LIDOCAINE 2% (20 MG/ML) 5 ML SYRINGE
INTRAMUSCULAR | Status: AC
  Filled 2020-12-09: qty 5

## 2020-12-09 MED ORDER — 0.9 % SODIUM CHLORIDE (POUR BTL) OPTIME
TOPICAL | Status: DC | PRN
  Administered 2020-12-09: 1000 mL

## 2020-12-09 MED ORDER — LACTATED RINGERS IV SOLN: INTRAVENOUS | Status: DC | PRN

## 2020-12-09 MED ORDER — ONDANSETRON HCL 4 MG/2ML IJ SOLN
INTRAMUSCULAR | Status: DC | PRN
  Administered 2020-12-09: 4 mg via INTRAVENOUS

## 2020-12-09 MED ORDER — ONDANSETRON HCL 4 MG PO TABS
4.0000 mg | ORAL_TABLET | Freq: Four times a day (QID) | ORAL | Status: DC | PRN
  Administered 2020-12-16: 4 mg via ORAL
  Filled 2020-12-09: qty 1

## 2020-12-09 MED ORDER — VANCOMYCIN HCL 1000 MG IV SOLR
INTRAVENOUS | Status: DC | PRN
  Administered 2020-12-09: 1000 mg

## 2020-12-09 MED ORDER — PHENYLEPHRINE 40 MCG/ML (10ML) SYRINGE FOR IV PUSH (FOR BLOOD PRESSURE SUPPORT)
PREFILLED_SYRINGE | INTRAVENOUS | Status: DC | PRN
Start: 2020-12-09 — End: 2020-12-09
  Administered 2020-12-09 (×3): 80 ug via INTRAVENOUS

## 2020-12-09 MED ORDER — HYDROCODONE-ACETAMINOPHEN 7.5-325 MG PO TABS
1.0000 | ORAL_TABLET | ORAL | Status: DC | PRN
  Administered 2020-12-10 – 2020-12-12 (×4): 2 via ORAL
  Administered 2020-12-13: 1 via ORAL
  Administered 2020-12-13 – 2020-12-16 (×11): 2 via ORAL
  Administered 2020-12-16: 1 via ORAL
  Administered 2020-12-17: 2 via ORAL
  Filled 2020-12-09 (×17): qty 2
  Filled 2020-12-09: qty 1
  Filled 2020-12-09: qty 2

## 2020-12-09 MED ORDER — SODIUM CHLORIDE 0.9% FLUSH
10.0000 mL | INTRAVENOUS | Status: DC | PRN
  Administered 2020-12-15: 10 mL

## 2020-12-09 MED ORDER — CEFAZOLIN SODIUM-DEXTROSE 2-3 GM-%(50ML) IV SOLR
INTRAVENOUS | Status: DC | PRN
  Administered 2020-12-09: 2 g via INTRAVENOUS

## 2020-12-09 MED ORDER — METOCLOPRAMIDE HCL 5 MG PO TABS: 5.0000 mg | ORAL_TABLET | Freq: Three times a day (TID) | ORAL | Status: DC | PRN

## 2020-12-09 MED ORDER — POLYETHYLENE GLYCOL 3350 17 G PO PACK: 17.0000 g | PACK | Freq: Every day | ORAL | Status: DC | PRN

## 2020-12-09 MED ORDER — ONDANSETRON HCL 4 MG/2ML IJ SOLN
INTRAMUSCULAR | Status: AC
  Filled 2020-12-09: qty 2

## 2020-12-09 MED ORDER — SODIUM CHLORIDE 0.9 % IV SOLN: INTRAVENOUS | Status: DC

## 2020-12-09 MED ORDER — HYDROMORPHONE HCL 1 MG/ML IJ SOLN
INTRAMUSCULAR | Status: DC | PRN
  Administered 2020-12-09 (×3): .5 mg via INTRAVENOUS

## 2020-12-09 MED ORDER — PROPOFOL 10 MG/ML IV BOLUS
INTRAVENOUS | Status: DC | PRN
  Administered 2020-12-09: 120 mg via INTRAVENOUS

## 2020-12-09 MED ORDER — MORPHINE SULFATE (PF) 2 MG/ML IV SOLN
0.5000 mg | INTRAVENOUS | Status: DC | PRN
  Administered 2020-12-10: 1 mg via INTRAVENOUS
  Filled 2020-12-09 (×2): qty 1

## 2020-12-09 MED ORDER — ENOXAPARIN SODIUM 40 MG/0.4ML IJ SOSY: 40.0000 mg | PREFILLED_SYRINGE | INTRAMUSCULAR | Status: DC

## 2020-12-09 MED ORDER — ONDANSETRON HCL 4 MG/2ML IJ SOLN: 4.0000 mg | Freq: Four times a day (QID) | INTRAMUSCULAR | Status: DC | PRN

## 2020-12-09 MED ORDER — MIDAZOLAM HCL 5 MG/5ML IJ SOLN
INTRAMUSCULAR | Status: DC | PRN
  Administered 2020-12-09 (×2): 1 mg via INTRAVENOUS

## 2020-12-09 MED ORDER — PHENYLEPHRINE 40 MCG/ML (10ML) SYRINGE FOR IV PUSH (FOR BLOOD PRESSURE SUPPORT)
PREFILLED_SYRINGE | INTRAVENOUS | Status: AC
  Filled 2020-12-09: qty 10

## 2020-12-09 MED ORDER — MIDAZOLAM HCL 2 MG/2ML IJ SOLN
INTRAMUSCULAR | Status: AC
  Filled 2020-12-09: qty 2

## 2020-12-09 MED ORDER — DEXAMETHASONE SODIUM PHOSPHATE 10 MG/ML IJ SOLN
INTRAMUSCULAR | Status: DC | PRN
Start: 2020-12-09 — End: 2020-12-09
  Administered 2020-12-09: 5 mg via INTRAVENOUS

## 2020-12-09 MED ORDER — DEXAMETHASONE SODIUM PHOSPHATE 10 MG/ML IJ SOLN
INTRAMUSCULAR | Status: AC
  Filled 2020-12-09: qty 1

## 2020-12-09 MED ORDER — ROCURONIUM BROMIDE 10 MG/ML (PF) SYRINGE
PREFILLED_SYRINGE | INTRAVENOUS | Status: DC | PRN
  Administered 2020-12-09: 40 mg via INTRAVENOUS

## 2020-12-09 MED ORDER — VANCOMYCIN HCL 1000 MG IV SOLR
INTRAVENOUS | Status: AC
  Filled 2020-12-09: qty 20

## 2020-12-09 MED ORDER — CEFAZOLIN SODIUM-DEXTROSE 2-4 GM/100ML-% IV SOLN
2.0000 g | Freq: Three times a day (TID) | INTRAVENOUS | Status: AC
  Administered 2020-12-09 – 2020-12-10 (×3): 2 g via INTRAVENOUS
  Filled 2020-12-09 (×3): qty 100

## 2020-12-09 MED ORDER — AMISULPRIDE (ANTIEMETIC) 5 MG/2ML IV SOLN: 10.0000 mg | Freq: Once | INTRAVENOUS | Status: DC | PRN

## 2020-12-09 MED ORDER — PROPOFOL 10 MG/ML IV BOLUS
INTRAVENOUS | Status: AC
  Filled 2020-12-09: qty 40

## 2020-12-09 MED ORDER — ACETAMINOPHEN 325 MG PO TABS
325.0000 mg | ORAL_TABLET | Freq: Four times a day (QID) | ORAL | Status: DC | PRN
  Administered 2020-12-11: 650 mg via ORAL
  Administered 2020-12-18: 500 mg via ORAL
  Filled 2020-12-09: qty 2

## 2020-12-09 MED ORDER — LIDOCAINE 2% (20 MG/ML) 5 ML SYRINGE
INTRAMUSCULAR | Status: DC | PRN
  Administered 2020-12-09: 60 mg via INTRAVENOUS

## 2020-12-09 MED ORDER — HYDROCODONE-ACETAMINOPHEN 5-325 MG PO TABS
1.0000 | ORAL_TABLET | ORAL | Status: DC | PRN
  Administered 2020-12-09 – 2020-12-13 (×4): 2 via ORAL
  Administered 2020-12-16: 1 via ORAL
  Administered 2020-12-17 – 2020-12-18 (×2): 2 via ORAL
  Filled 2020-12-09 (×3): qty 2
  Filled 2020-12-09: qty 1
  Filled 2020-12-09 (×3): qty 2

## 2020-12-09 MED ORDER — HYDROMORPHONE HCL 1 MG/ML IJ SOLN: 0.2500 mg | INTRAMUSCULAR | Status: DC | PRN

## 2020-12-09 SURGICAL SUPPLY — 77 items
BAG COUNTER SPONGE SURGICOUNT (BAG) ×2 IMPLANT
BANDAGE ESMARK 6X9 LF (GAUZE/BANDAGES/DRESSINGS) ×1 IMPLANT
BIT DRILL 3.3 LONG (BIT) ×4 IMPLANT
BIT DRILL QC 3.3X195 (BIT) ×2 IMPLANT
BLADE CLIPPER SURG (BLADE) IMPLANT
BLADE SURG 15 STRL LF DISP TIS (BLADE) ×1 IMPLANT
BLADE SURG 15 STRL SS (BLADE) ×1
BNDG ELASTIC 4X5.8 VLCR STR LF (GAUZE/BANDAGES/DRESSINGS) ×2 IMPLANT
BNDG ELASTIC 6X5.8 VLCR STR LF (GAUZE/BANDAGES/DRESSINGS) ×2 IMPLANT
BNDG ESMARK 6X9 LF (GAUZE/BANDAGES/DRESSINGS) ×2
BNDG GAUZE ELAST 4 BULKY (GAUZE/BANDAGES/DRESSINGS) ×2 IMPLANT
BRUSH SCRUB EZ PLAIN DRY (MISCELLANEOUS) ×4 IMPLANT
CANISTER SUCT 3000ML PPV (MISCELLANEOUS) ×2 IMPLANT
CAP LOCK NCB (Cap) ×14 IMPLANT
CHLORAPREP W/TINT 26 (MISCELLANEOUS) ×4 IMPLANT
COVER SURGICAL LIGHT HANDLE (MISCELLANEOUS) ×2 IMPLANT
CUFF TOURN SGL QUICK 34 (TOURNIQUET CUFF) ×1
CUFF TRNQT CYL 34X4.125X (TOURNIQUET CUFF) ×1 IMPLANT
DRAPE C-ARM 42X72 X-RAY (DRAPES) ×2 IMPLANT
DRAPE C-ARMOR (DRAPES) ×2 IMPLANT
DRAPE ORTHO SPLIT 77X108 STRL (DRAPES) ×2
DRAPE SURG ORHT 6 SPLT 77X108 (DRAPES) ×2 IMPLANT
DRAPE U-SHAPE 47X51 STRL (DRAPES) ×2 IMPLANT
DRSG MEPITEL 4X7.2 (GAUZE/BANDAGES/DRESSINGS) ×2 IMPLANT
DRSG PAD ABDOMINAL 8X10 ST (GAUZE/BANDAGES/DRESSINGS) ×4 IMPLANT
ELECT REM PT RETURN 9FT ADLT (ELECTROSURGICAL) ×2
ELECTRODE REM PT RTRN 9FT ADLT (ELECTROSURGICAL) ×1 IMPLANT
GAUZE SPONGE 4X4 12PLY STRL (GAUZE/BANDAGES/DRESSINGS) ×2 IMPLANT
GLOVE SURG ENC MOIS LTX SZ6.5 (GLOVE) ×6 IMPLANT
GLOVE SURG ENC MOIS LTX SZ7.5 (GLOVE) ×8 IMPLANT
GLOVE SURG UNDER POLY LF SZ6.5 (GLOVE) ×2 IMPLANT
GLOVE SURG UNDER POLY LF SZ7.5 (GLOVE) ×2 IMPLANT
GOWN STRL REUS W/ TWL LRG LVL3 (GOWN DISPOSABLE) ×2 IMPLANT
GOWN STRL REUS W/TWL LRG LVL3 (GOWN DISPOSABLE) ×2
IMMOBILIZER KNEE 22 UNIV (SOFTGOODS) ×2 IMPLANT
K-WIRE 2.0 (WIRE) ×2
K-WIRE FXSTD 280X2XNS SS (WIRE) ×2
KIT BASIN OR (CUSTOM PROCEDURE TRAY) ×2 IMPLANT
KIT TURNOVER KIT B (KITS) ×2 IMPLANT
KWIRE FXSTD 280X2XNS SS (WIRE) ×2 IMPLANT
NDL SUT 6 .5 CRC .975X.05 MAYO (NEEDLE) ×1 IMPLANT
NEEDLE MAYO TAPER (NEEDLE) ×1
NS IRRIG 1000ML POUR BTL (IV SOLUTION) ×2 IMPLANT
PACK TOTAL JOINT (CUSTOM PROCEDURE TRAY) ×2 IMPLANT
PAD ARMBOARD 7.5X6 YLW CONV (MISCELLANEOUS) ×4 IMPLANT
PAD CAST 4YDX4 CTTN HI CHSV (CAST SUPPLIES) ×1 IMPLANT
PADDING CAST ABS 6INX4YD NS (CAST SUPPLIES) ×1
PADDING CAST ABS COTTON 6X4 NS (CAST SUPPLIES) ×1 IMPLANT
PADDING CAST COTTON 4X4 STRL (CAST SUPPLIES) ×1
PADDING CAST COTTON 6X4 STRL (CAST SUPPLIES) ×2 IMPLANT
PLATE NCB LAT PROX 3H TIB 7H (Plate) ×2 IMPLANT
SCREW NCB 4.0 32MM (Screw) ×2 IMPLANT
SCREW NCB 4.0MX34M (Screw) ×2 IMPLANT
SCREW NCB 4.0MX38M (Screw) ×2 IMPLANT
SCREW NCB 4.0X36MM (Screw) ×2 IMPLANT
SCREW NCB 4.0X75 CORT S/T (Screw) ×2 IMPLANT
SCREW NCB 4X3 4X70 (Screw) ×4 IMPLANT
SCREW PROX ST NCB 4X80 (Screw) ×2 IMPLANT
SPONGE T-LAP 18X18 ~~LOC~~+RFID (SPONGE) ×4 IMPLANT
STAPLER VISISTAT 35W (STAPLE) ×2 IMPLANT
SUCTION FRAZIER HANDLE 10FR (MISCELLANEOUS) ×1
SUCTION TUBE FRAZIER 10FR DISP (MISCELLANEOUS) ×1 IMPLANT
SUT ETHILON 2 0 FS 18 (SUTURE) ×2 IMPLANT
SUT ETHILON 3 0 PS 1 (SUTURE) IMPLANT
SUT FIBERWIRE #2 38 T-5 BLUE (SUTURE)
SUT VIC AB 0 CT1 27 (SUTURE)
SUT VIC AB 0 CT1 27XBRD ANBCTR (SUTURE) IMPLANT
SUT VIC AB 1 CT1 18XCR BRD 8 (SUTURE) IMPLANT
SUT VIC AB 1 CT1 27 (SUTURE) ×1
SUT VIC AB 1 CT1 27XBRD ANBCTR (SUTURE) ×1 IMPLANT
SUT VIC AB 1 CT1 8-18 (SUTURE)
SUT VIC AB 2-0 CT1 27 (SUTURE) ×2
SUT VIC AB 2-0 CT1 TAPERPNT 27 (SUTURE) ×2 IMPLANT
SUTURE FIBERWR #2 38 T-5 BLUE (SUTURE) IMPLANT
TOWEL GREEN STERILE (TOWEL DISPOSABLE) ×4 IMPLANT
TRAY FOLEY MTR SLVR 16FR STAT (SET/KITS/TRAYS/PACK) IMPLANT
WATER STERILE IRR 1000ML POUR (IV SOLUTION) ×4 IMPLANT

## 2020-12-09 NOTE — TOC CAGE-AID Note (Signed)
Transition of Care [facility]) - CAGE-AID Screening   Patient Details  Name: Rebecca [name] MRN: [id number] Date of Birth: 1960/11/08  [facility] CM/SW Contact:    Teeghan Hammer C Tarpley-Carter, [place] Phone Number: 12/09/2020, 3:01 PM   Clinical Narrative: Pt participated in [facility].  Pt stated she does not use substance or ETOH.  Pt was not offered resources, due to no usage of substance or ETOH.     Zeddie Njie Tarpley-Carter, MSW, LCSW-A Pronouns:  She/Her/Hers [facility] HealthTransitions of Care Clinical Social Worker Direct Number:  [phone] [email or web address]   CAGE-AID Screening:    Have You Ever Felt You Ought to Cut Down on Your Drinking or Drug Use?: No Have People Annoyed You By [employer] Your Drinking Or Drug Use?: No Have You Felt Bad Or Guilty About Your Drinking Or Drug Use?: No Have You Ever Had a Drink or Used Drugs First Thing In The Morning to Steady Your Nerves or to Get Rid of a Hangover?: No CAGE-AID Score: 0  Substance Abuse Education Offered: No

## 2020-12-09 NOTE — Evaluation (Signed)
Physical Therapy Evaluation Patient Details Name: Rebecca [name] MRN: [id number] DOB: 06/04/1960 Today's Date: 12/09/2020  History of Present Illness  60 yo female presents to [facility] on 10/31 with fall and LLE pain, sustained L comminuted proximal tibia/tibial plateau fracture. s/p ORIF L tibial plateau fx on 11/2. PMH includes endocarditis with resultant mild AS, breast cancer (2015), MS, IBS, ACDF 2013, and seizures.  Clinical Impression   Pt presents with LE spasticity R>L, truncal extensor tone, severe LLE post-operative pain, impaired knowledge and application of precautions, max difficulty performing mobility tasks, and decreased activity tolerance vs baseline. Pt to benefit from acute PT to address deficits. Pt requiring mod-max +2 for moving to/from EOB, tolerated x3 lateral scoots towards HOB but quickly needed return to supine given truncal and RLE extensor tone and sliding anteriorly towards EOB. PT to progress mobility as tolerated, and will continue to follow acutely.         Recommendations for follow up therapy are one component of a multi-disciplinary discharge planning process, led by the attending physician.  Recommendations may be updated based on patient status, additional functional criteria and insurance authorization.  Follow Up Recommendations Skilled nursing-short term rehab (<3 hours/day)    Assistance Recommended at Discharge Frequent or constant Supervision/Assistance  Functional Status Assessment Patient has had a recent decline in their functional status and demonstrates the ability to make significant improvements in function in a reasonable and predictable amount of time.  Equipment Recommendations  Wheelchair cushion (measurements PT);Wheelchair (measurements PT)    Recommendations for Other Services       Precautions / Restrictions Precautions Precautions: Fall Restrictions Weight Bearing Restrictions: Yes LLE Weight Bearing: Touchdown weight bearing       Mobility  Bed Mobility Overal bed mobility: Needs Assistance Bed Mobility: Supine to Sit;Sit to Supine     Supine to sit: Mod assist Sit to supine: Max assist;HOB elevated;+2 for physical assistance   General bed mobility comments: mod assist for trunk elevation, LE lowering over EOB, scooting to EOB with bed pad. Max +2 for return to supine for LE and truncal managment, boost up in bed with bed pads and HOB flat.    Transfers Overall transfer level: Needs assistance Equipment used: None Transfers: Lateral/Scoot Transfers            Lateral/Scoot Transfers: Min assist General transfer comment: light assist for truncal translation, cues for head-hips relationship to offweight buttocks and scoot towards R x3.    Ambulation/Gait                Stairs            Wheelchair Mobility    Modified Rankin (Stroke Patients Only)       Balance Overall balance assessment: Needs assistance;History of Falls Sitting-balance support: No upper extremity supported Sitting balance-Leahy Scale: Fair Sitting balance - Comments: able to sit EOB without PT assist, transitioning to poor with fatigue and increased truncal extension Postural control: Posterior lean                                   Pertinent Vitals/Pain Pain Assessment: 0-10 Pain Score: 8  Pain Location: LLE Pain Descriptors / Indicators: Sore;Discomfort;Operative site guarding Pain Intervention(s): Limited activity within patient's tolerance;Monitored during session;Repositioned    Home Living Family/patient expects to be discharged to:: Private residence Living Arrangements: Spouse/significant other Available Help at Discharge: Family Type of Home: House Home Access: Ramped  entrance       Home Layout: One level Home Equipment: Conservation officer, nature (2 wheels);BSC;Transport chair      Prior Function Prior Level of Function : Needs assist;History of Falls (last six months)        Physical Assist : Mobility (physical);ADLs (physical) Mobility (physical): Transfers;Bed mobility ADLs (physical): Grooming;Bathing;Dressing;Toileting Mobility Comments: Pt reports she always has someone (husband, aide, RN) to assist with transfers, short-distance gait with RW ADLs Comments: [place] aide 3 hours/day, 7 days a week. Also has an Therapist, sports who changes foley catheter every 20 days.     Hand Dominance   Dominant Hand: Right    Extremity/Trunk Assessment   Upper Extremity Assessment Upper Extremity Assessment: Defer to OT evaluation    Lower Extremity Assessment Lower Extremity Assessment: RLE deficits/detail;LLE deficits/detail RLE Deficits / Details: significant extensor spasticity; mod difficulty assisting pt in DF, knee flexion and goes into LE and truncal extension sitting EOB RLE Coordination: decreased gross motor LLE Deficits / Details: anticipated post-operative pain and weakness; able to tolerate 5-25* knee ext-flex, limited ROM DF LLE Coordination: decreased gross motor    Cervical / Trunk Assessment Cervical / Trunk Assessment: Normal  Communication   Communication: No difficulties  Cognition Arousal/Alertness: Awake/alert Behavior During Therapy: WFL for tasks assessed/performed Overall Cognitive Status: Within Functional Limits for tasks assessed                                 General Comments: Pleasant, talkative        General Comments      Exercises     Assessment/Plan    PT Assessment Patient needs continued PT services  PT Problem List Decreased strength;Decreased mobility;Decreased safety awareness;Decreased activity tolerance;Decreased balance;Decreased knowledge of use of DME;Pain;Decreased cognition;Decreased knowledge of precautions;Decreased range of motion;Impaired tone       PT Treatment Interventions DME instruction;Therapeutic activities;Gait training;Therapeutic exercise;Patient/family education;Balance  training;Neuromuscular re-education;Functional mobility training    PT Goals (Current goals can be found in the Care Plan section)  Acute Rehab PT Goals Patient Stated Goal: return to PLOF PT Goal Formulation: With patient Time For Goal Achievement: 12/23/20 Potential to Achieve Goals: Good    Frequency Min 3X/week   Barriers to discharge        Co-evaluation               AM-PAC PT "6 Clicks" Mobility  Outcome Measure Help needed turning from your back to your side while in a flat bed without using bedrails?: A Lot Help needed moving from lying on your back to sitting on the side of a flat bed without using bedrails?: A Lot Help needed moving to and from a bed to a chair (including a wheelchair)?: Total Help needed standing up from a chair using your arms (e.g., wheelchair or bedside chair)?: Total Help needed to walk in hospital room?: Total Help needed climbing 3-5 steps with a railing? : Total 6 Click Score: 8    End of Session   Activity Tolerance: Patient tolerated treatment well;Patient limited by pain Patient left: in bed;with bed alarm set;with call bell/phone within reach;with nursing/sitter in room Nurse Communication: Mobility status PT Visit Diagnosis: Other abnormalities of gait and mobility (R26.89);Muscle weakness (generalized) (M62.81);Pain Pain - Right/Left: Left Pain - part of body: Leg    Time: 3220-2542 PT Time Calculation (min) (ACUTE ONLY): 26 min   Charges:   PT Evaluation $PT Eval Low Complexity: 1 Low PT  Treatments $Therapeutic Activity: 8-22 mins       Stacie Glaze, PT DPT Acute Rehabilitation Services Pager [phone]  Office [phone]   Louis Matte 12/09/2020, 4:09 PM

## 2020-12-09 NOTE — Anesthesia Preprocedure Evaluation (Signed)
Anesthesia Evaluation  Patient identified by MRN, date of birth, ID band Patient awake    Reviewed: Allergy & Precautions, NPO status , Patient's Chart, lab work & pertinent test results  Airway Mallampati: II  TM Distance: >3 FB Neck ROM: Full    Dental  (+) Dental Advisory Given   Pulmonary neg pulmonary ROS,    breath sounds clear to auscultation       Cardiovascular + Valvular Problems/Murmurs AS  Rhythm:Regular Rate:Normal     Neuro/Psych Seizures -,   Neuromuscular disease (MS)    GI/Hepatic Neg liver ROS, GERD  ,  Endo/Other  negative endocrine ROS  Renal/GU negative Renal ROS     Musculoskeletal   Abdominal   Peds  Hematology  (+) anemia ,   Anesthesia Other Findings   Reproductive/Obstetrics                             Lab Results  Component Value Date   WBC 4.7 12/09/2020   HGB 8.2 (L) 12/09/2020   HCT 24.9 (L) 12/09/2020   MCV 90.5 12/09/2020   PLT 121 (L) 12/09/2020   Lab Results  Component Value Date   CREATININE 0.52 12/09/2020   BUN 12 12/09/2020   NA 133 (L) 12/09/2020   K 3.6 12/09/2020   CL 103 12/09/2020   CO2 26 12/09/2020    Anesthesia Physical Anesthesia Plan  ASA: 3  Anesthesia Plan: General   Post-op Pain Management:    Induction: Intravenous  PONV Risk Score and Plan: 3 and Dexamethasone, Ondansetron and Midazolam  Airway Management Planned: Oral ETT  Additional Equipment: None  Intra-op Plan:   Post-operative Plan: Extubation in OR  Informed Consent: I have reviewed the patients History and Physical, chart, labs and discussed the procedure including the risks, benefits and alternatives for the proposed anesthesia with the patient or authorized representative who has indicated his/her understanding and acceptance.     Dental advisory given  Plan Discussed with: CRNA  Anesthesia Plan Comments:         Anesthesia Quick  Evaluation

## 2020-12-09 NOTE — Transfer of Care (Signed)
Immediate Anesthesia Transfer of Care Note  Patient: Rebecca [name]  Procedure(s) Performed: LEFT OPEN REDUCTION INTERNAL FIXATION (ORIF) TIBIAL PLATEAU (Left)  Patient Location: PACU  Anesthesia Type:General  Level of Consciousness: drowsy and patient cooperative  Airway & Oxygen Therapy: Patient Spontanous Breathing and Patient connected to nasal cannula oxygen  Post-op Assessment: Report given to RN and Post -op Vital signs reviewed and stable  Post vital signs: Reviewed and stable  Last Vitals:  Vitals Value Taken Time  BP 101/62 12/09/20 1007  Temp    Pulse 94 12/09/20 1012  Resp 16 12/09/20 1012  SpO2 100 % 12/09/20 1012  Vitals shown include unvalidated device data.  Last Pain:  Vitals:   12/09/20 0747  TempSrc: Oral  PainSc:       Patients Stated Pain Goal: 1 (42/39/53 2023)  Complications: No notable events documented.

## 2020-12-09 NOTE — Anesthesia Procedure Notes (Signed)
Procedure Name: Intubation Date/Time: 12/09/2020 8:55 AM Performed by: Colin Benton, CRNA Pre-anesthesia Checklist: Patient identified, Emergency Drugs available, Suction available and Patient being monitored Patient Re-evaluated:Patient Re-evaluated prior to induction Oxygen Delivery Method: Circle system utilized Preoxygenation: Pre-oxygenation with 100% oxygen Induction Type: IV induction Ventilation: Mask ventilation without difficulty and Oral airway inserted - appropriate to patient size Laryngoscope Size: Glidescope and 3 Grade View: Grade I Tube type: Oral Tube size: 7.0 mm Number of attempts: 1 Airway Equipment and Method: Rigid stylet and Video-laryngoscopy Placement Confirmation: ETT inserted through vocal cords under direct vision, positive ETCO2 and breath sounds checked- equal and bilateral Secured at: 22 cm Tube secured with: Tape Dental Injury: Teeth and Oropharynx as per pre-operative assessment  Comments: Elective glidescope intubation due to previous cervical surgery.

## 2020-12-09 NOTE — Plan of Care (Signed)

## 2020-12-09 NOTE — Progress Notes (Signed)
PROGRESS NOTE  Rebecca [name]  DOB: 30-May-1960  PCP: Delsa Grana, PA-C [id number]  DOA: 12/08/2020  LOS: 1 day  Hospital Day: 2  Chief complaint: Fall   Brief narrative: Rebecca [name] is a 60 y.o. 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 On Saturday 10/30, patient got up from her potty chair and attempted to move to the lift chair with a walker.  In the process, her left leg got stuck on the carpet because it twisted awkwardly and felt a snap on her leg.  EMS came and lifted her back into the lift chair versus declined across port to ED at that time.  She was unable to get back up since then and hence he called EMS again 11/31.  She could not stand at all and hence agreed to be brought to the ED at [facility].  Imagings of left knee showed severely displaced and comminuted proximal left tibial fracture which appears to extend to the articular surface of the lateral tibial plateau.  Also noted to have moderately displaced and comminuted proximal left fibula fracture.   Because of the complexity of her fracture, patient was transferred to [facility] Admitted to hospitalist service. On 11/2, patient underwent ORIF of left tibial plateau fracture by Dr. Doreatha Martin   Subjective: Patient was seen and examined this afternoon. Lying in bed.  Not in distress.  Underwent ORIF morning.  Pain controlled.  Assessment/Plan: Left tibia/left fibula fracture -Sustained fracture after mechanical fall. -11/2, underwent ORIF of left tibial fracture. -Due to prophylaxis and pain per orthopedics team. -PT eval postprocedure  Multiple sclerosis -She is minimally ambulatory at baseline -Continue amantadine, Baclofen,  Zanaflex -She is on Rebif 3x/week   Seizure d/o -Continue Keppra   Neurogenic bladder -Has an indwelling foley -Continue Ditropan   Obesity -Body mass index is 36.73 kg/m. Patient has been advised to make an attempt to improve diet and exercise patterns to aid in weight loss.   Mobility: Encourage ambulation.  PT eval pending Living condition: Lives at home Goals of care:   Code Status: Full Code  Nutritional status: Body mass index is 36.73 kg/m.     Diet:  Diet Order             Diet Heart Room service appropriate? Yes; Fluid consistency: Thin  Diet effective now                  DVT prophylaxis:  enoxaparin (LOVENOX) injection 40 mg Start: 12/10/20 0800 SCDs Start: 12/09/20 1120 SCDs Start: 12/08/20 1809   Antimicrobials:. Peri op antibiotics per orthopedics Fluid: Normal saline at 50 mill per hr Consultants: Orthopedics Family Communication: None at bedside  Status is: Inpatient  Remains inpatient appropriate because: POD 0  Dispo: The patient is from: Home              Anticipated d/c is to: Pending PT eval              Patient currently is not medically stable to d/c.   Difficult to place patient No     Infusions:   sodium chloride 50 mL/hr at 12/09/20 1254    ceFAZolin (ANCEF) IV 2 g (12/09/20 1642)    Scheduled  Meds:  amantadine  100 mg Oral TID   baclofen  10 mg Oral TID   Chlorhexidine Gluconate Cloth  6 each Topical Q0600   COVID-19 mRNA bivalent vaccine (Moderna)  0.5 mL Intramuscular ONCE-1600   docusate sodium  100 mg Oral BID   [START ON 12/10/2020] enoxaparin (LOVENOX) injection  40 mg Subcutaneous Q24H   levETIRAcetam  500 mg Oral BID   lipase/protease/amylase  36,000 Units Oral TID WC   loratadine  10 mg Oral Daily   mupirocin ointment  1 application Nasal BID   oxybutynin  10 mg Oral BID   pantoprazole  40 mg Oral Daily   polyethylene glycol  17 g Oral Daily   tiZANidine  4 mg Oral BID    PRN meds: [START ON 12/10/2020]  acetaminophen, bisacodyl, HYDROcodone-acetaminophen, HYDROcodone-acetaminophen, metoCLOPramide **OR** metoCLOPramide (REGLAN) injection, morphine injection, ondansetron **OR** ondansetron (ZOFRAN) IV, polyethylene glycol   Antimicrobials: Anti-infectives (From admission, onward)    Start     Dose/Rate Route Frequency Ordered Stop   12/09/20 1700  ceFAZolin (ANCEF) IVPB 2g/100 mL premix        2 g 200 mL/hr over 30 Minutes Intravenous Every 8 hours 12/09/20 1120 12/10/20 1659   12/09/20 0942  vancomycin (VANCOCIN) powder  Status:  Discontinued          As needed 12/09/20 0942 12/09/20 1004       Objective: Vitals:   12/09/20 1055 12/09/20 1418  BP: 124/68 109/63  Pulse: 88 91  Resp: 20 19  Temp:  97.6 F (36.4 C)  SpO2: 95% 96%    Intake/Output Summary (Last 24 hours) at 12/09/2020 1710 Last data filed at 12/09/2020 1638 Gross per 24 hour  Intake 1100 ml  Output 1400 ml  Net -300 ml   Filed Weights   12/08/20 1628 12/09/20 0747  Weight: 97.1 kg 97.1 kg   Weight change:  Body mass index is 36.73 kg/m.   Physical Exam: General exam: Pleasant, middle-aged Caucasian female.  Not in physical distress Skin: No rashes, lesions or ulcers. HEENT: Atraumatic, normocephalic, no obvious bleeding Lungs: Clear to auscultation bilaterally CVS: Regular rate and rhythm, mild ejection systolic murmur GI/Abd soft, nontender, nondistended, bowel sound present CNS: Alert, awake, oriented x3 Psychiatry: Mood appropriate Extremities: No pedal edema, no calf tenderness  Data Review: I have personally reviewed the laboratory data and studies available.  F/u labs ordered Unresulted Labs (From admission, onward)     Start     Ordered   12/16/20 0500  Creatinine, serum  (enoxaparin (LOVENOX)    CrCl >/= 30 ml/min)  Weekly,   R     Comments: while on enoxaparin therapy    12/09/20 1120   12/10/20 0500  CBC with Differential/Platelet  Daily,   R      12/09/20 0849   12/10/20 0500   Comprehensive metabolic panel  Daily,   R      12/09/20 0849   12/10/20 4920  Basic metabolic panel  Daily,   R      12/09/20 1120   12/10/20 0500  CBC  Daily,   R      12/09/20 1120            Signed, Terrilee Croak, MD [facility] 12/09/2020

## 2020-12-09 NOTE — Op Note (Signed)
Orthopaedic Surgery Operative Note (CSN: [id number] ) Date of Surgery: 12/09/2020  Admit Date: 12/08/2020   Diagnoses: Pre-Op Diagnoses: Left tibial plateau fracutre  Post-Op Diagnosis: Same  Procedures: CPT 12458-KDXI reduction internal fixation of left tibial plateau fracture  Surgeons : Primary: Shona Needles, MD  Assistant: Patrecia Pace, PA-C  Location: OR 7   Anesthesia:General   Antibiotics: Ancef 2g preop with 1 gm vancomycin powder placed topically   Tourniquet time:None used    Estimated Blood PJAS:505 mL  Complications: None  Specimens:* No specimens in log *   Implants: Implant Name Type Inv. Item Serial No. Manufacturer Lot No. LRB No. Used Action  PLATE NCB LAT PROX 3H TIB 7H - [id number] Plate PLATE NCB LAT PROX 3H TIB 7H  [employer](ORTH,TRAU,BIO,SG)  Left 1 Implanted  SCREW PROX ST NCB 4X80 - [id number] Screw SCREW PROX ST NCB 4X80  [employer](ORTH,TRAU,BIO,SG)  Left 1 Implanted  SCREW NCB 4.0MX38M - [id number] Screw SCREW NCB 4.0MX38M  [employer](ORTH,TRAU,BIO,SG)  Left 1 Implanted  SCREW NCB 4.0MX34M - [id number] Screw SCREW NCB 4.0MX34M  [employer](ORTH,TRAU,BIO,SG)  Left 1 Implanted  SCREW NCB 4.0 32MM - [id number] Screw SCREW NCB 4.0 32MM  [employer](ORTH,TRAU,BIO,SG)  Left 1 Implanted  SCREW NCB 4.0X36MM - [id number] Screw SCREW NCB 4.0X36MM  [employer](ORTH,TRAU,BIO,SG)  Left 1 Implanted  SCREW NCB 4X3 4X70 - [id number] Screw SCREW NCB 4X3 4X70  [employer](ORTH,TRAU,BIO,SG)  Left 2 Implanted  SCREW NCB 4.0X75 CORT S/T - [id number] Screw SCREW NCB 4.0X75 CORT S/T  [employer](ORTH,TRAU,BIO,SG)  Left 1 Implanted  CAP LOCK NCB - [id number] Cap CAP LOCK NCB  [employer](ORTH,TRAU,BIO,SG)  Left 7 Implanted     Indications for Surgery: 60 year old female who sustained a ground-level fall and a left proximal tibia fracture.  Patient has a history of MS as well as lower extremity lymphedema.  I recommended proceeding with open reduction internal  fixation.  Risks included but not limited to bleeding, infection, malunion, nonunion, hardware failure, hardware irritation, nerve blood vessel injury, knee stiffness, DVT, even possibility anesthetic complications.  She agreed to proceed with surgery and consent was obtained.  Operative Findings: Open reduction internal fixation of left proximal tibia fracture using [employer] NCB proximal tibial locking plate  Procedure: The patient was identified in the preoperative holding area. Consent was confirmed with the patient and their family and all questions were answered. The operative extremity was marked after confirmation with the patient. she was then brought back to the operating room by our anesthesia colleagues.  She was placed under general anesthetic and carefully transferred over to a radiolucent flat top table.  A bump was placed under her operative hip.  The left lower extremity was then prepped and draped in usual sterile fashion.  Timeout was performed to verify the patient, the procedure, and the extremity.  Preoperative antibiotics were dosed.  Using fluoroscopy as a guide I marked out an incision over the proximal lateral tibial condyle.  I carried this down through skin and subcutaneous tissue.  I then released the IT band off of the lateral condyle and expose the lateral cortex.  I then developed the interval between the anterior musculature and the lateral cortex of the tibia.  Traction was applied and a 7 hole plate was slid submuscularly along the lateral cortex of the femur attached to a targeting arm.  K wire was placed proximally to hold the position of the plate.  Percutaneous at 3.3 mm drill bit was placed distally to  hold the distal portion of the plate.  A reduction maneuver was performed by grasping the medial cortex of the tibial shaft with a bone hook and bringing it laterally.  A nonlocking 4.0 millimeter screw was placed in the proximal segment to bring the plate flush to  bone.  Then percutaneously placed 4.0 millimeter screws into the tibial shaft to correct the coronal alignment.  A total of 4 screws were placed in the distal shaft.  Locking caps were placed on the proximal 3 screws of these.  I then removed the targeting arm and then placed a total of 4 screws in the proximal segment.  Locking caps were placed on all of these.  Final fluoroscopic imaging was obtained.  The incision was copiously irrigated.  A gram of vancomycin powder was placed into the incision.  The IT band was closed with 0 Vicryl suture.  The skin was closed with 0 Vicryl, 2-0 Vicryl and 3-0 nylon.  Sterile dressings were applied.  The patient was then awoken from anesthesia and taken to the PACU in stable condition.  Post Op Plan/Instructions: Patient will be touchdown weightbearing to left lower extremity.  She will receive postoperative Ancef.  She will receive Lovenox for DVT prophylaxis.  We will have her mobilize physical and Occupational Therapy.  I was present and performed the entire surgery.  Patrecia Pace, PA-C did assist me throughout the case. An assistant was necessary given the difficulty in approach, maintenance of reduction and ability to instrument the fracture.   Katha Hamming, MD [facility]

## 2020-12-09 NOTE — Consult Note (Signed)
Orthopaedic Trauma Service (OTS) Consult   Patient ID: Rebecca [name] MRN: [id number] DOB/AGE: [birth year] 60 y.o.  Reason for Consult: Left tibial plateau fracture Referring Physician: Dr. Karmen Bongo, MD [facility]  HPI: Rebecca [name] is an 60 y.o. female who is being seen in consultation at the request of Dr. Lorin Mercy for evaluation of left tibial plateau fracture.  Patient has a history of MS as well as history of breast cancer.  She ambulates with the use of a walker but uses a transport chair in the house.  She lives with her husband.  She was getting up to use the potty chair when she got her leg caught she ended up twisting and then falling back and having the leg bent up underneath her.  She initially did not want to get transported to the hospital but she was unable to stand and was eventually brought to the emergency room.  She brought to [facility] at which point they contacted their orthopedic doctor who felt that this was outside the scope of care and recommended treatment by an orthopedic traumatologist.  She was transferred yesterday evening and we were contacted and consulted.  Patient was seen and evaluated in the preoperative holding area.  She is currently comfortable and does not have any much pain.  At baseline she states that she does have some weakness in her bilateral lower extremities.  The left leg is better than the right she states.  She still ambulates as noted above.  She does not have a motorized wheelchair.  She has a indwelling Foley catheter for chronic incontinence for which she is to see a urologist in [place] later this month to discuss options regarding this.  She has a health aide that bathes her every day.  She has a home health nurse that comes and assists as well.  Her house is partially handicap accessible.  Past Medical History:  Diagnosis Date   Allergy    Aortic valve disease    Mild AS / AI - most recent Echo demonstrated tricuspid aortic  valve.   Bacterial endocarditis    History of .   Bilateral lower extremity edema    Noncardiac.  Chronic. LE Venous dopplers - negative for DVT.; Echocardiogram January 2016: Normal EF with normal wall motion and valve function. Only grade 1 diastolic dysfunction. EF 60-65%. Mild MR   Breast cancer ([place]) 12/31/2013   Right breast, 12:00, 1.5 cm, T1c,N0 invasive mammary carcinoma, triple negative. --> Rx with Chemo   Cervical stenosis of spine    GERD (gastroesophageal reflux disease) April or May 2022   Heart murmur 2013   Herpes zoster    IBS (irritable bowel syndrome)    Lymphedema    has legs wrapped at [facility]   Multiple [place] 2001   Walks from room to room @ home; but Wheelchair when going out.   Neuromuscular disorder ([place])    MS   PONV (postoperative nausea and vomiting)    Related to Fentanyl   Seizures ([place])    Takes Keppra   Syncope and collapse     Past Surgical History:  Procedure Laterality Date   ANKLE SURGERY     Left   ANKLE SURGERY     ANTERIOR CERVICAL DECOMP/DISCECTOMY FUSION  11/17/2011   Procedure: ANTERIOR CERVICAL DECOMPRESSION/DISCECTOMY FUSION 2 LEVELS;  Surgeon: Erline Levine, MD;  Location: [facility];  Service: Neurosurgery;  Laterality: N/A;  Cervical Five-Six Six-Seven Anterior cervical decompression/diskectomy/fusion   BREAST  BIOPSY Right 12/31/2013   invasive mammary   BREAST SURGERY Right 02/03/2014   Right simple mastectomy with sentinel node biopsy.   CHOLECYSTECTOMY     COLONOSCOPY  2014   ESOPHAGOGASTRODUODENOSCOPY (EGD) WITH PROPOFOL N/A 07/16/2020   Procedure: ESOPHAGOGASTRODUODENOSCOPY (EGD) WITH PROPOFOL;  Surgeon: Virgel Manifold, MD;  Location: [facility];  Service: Endoscopy;  Laterality: N/A;  Patient has MS and will need assistance   HYSTEROSCOPY WITH D & C N/A 11/27/2018   Procedure: DILATATION AND CURETTAGE /HYSTEROSCOPY;  Surgeon: Gae Dry, MD;  Location: [facility];  Service: Gynecology;  Laterality:  N/A;   Lower extremity venous Dopplers  02/27/2013   No LE DVT   MASTECTOMY Right 2015   Port a cath insertion Right 01/19/2010   PORT-A-CATH REMOVAL     right   PORT-A-CATH REMOVAL Right 09/03/2013   Procedure: REMOVAL PORT-A-CATH;  Surgeon: Conrad , MD;  Location: [facility];  Service: Vascular;  Laterality: Right;   SPINE SURGERY  Cervical steinois Dr. Vertell Limber 2014 I think?   TONSILLECTOMY     TRANSTHORACIC ECHOCARDIOGRAM  03/2013; 02/2014   a) Normal LV size and function with EF 60-65%.; Cannot exclude bicuspid aortic valve with mild AS and mild AI.; b) Normal EF with normal wall motion and valve function x Mild MR. G2 DD. EF 60-65%. Tricuspid AoV   UPPER GI ENDOSCOPY  2014    Family History  Problem Relation Age of Onset   Cancer Father        skin   Heart disease Father    Heart attack Father        heart attack in his 57's   Thyroid disease Sister    Ovarian cancer Cousin    Breast cancer Maternal Aunt 60   Breast cancer Maternal Grandmother 74   Bladder Cancer Neg Hx    Kidney cancer Neg Hx     Social History:  reports that she has never smoked. She has never used smokeless tobacco. She reports that she does not drink alcohol and does not use drugs.  Allergies:  Allergies  Allergen Reactions   Fentanyl Nausea And Vomiting and Nausea Only    vomiting Was given in PACU x3 each time patient got sick    Sulfa Antibiotics Hives and Other (See Comments)    Light headed, over heated    Medications:  No current facility-administered medications on file prior to encounter.   Current Outpatient Medications on File Prior to Encounter  Medication Sig Dispense Refill   amantadine (SYMMETREL) 100 MG capsule Take 1 capsule (100 mg total) by mouth 3 (three) times daily. 270 capsule 4   baclofen (LIORESAL) 10 MG tablet TAKE 1 TO 2 TABLETS BY MOUTH 3 TIMES A DAY (Patient taking differently: 10-20 mg 3 (three) times daily.) 540 tablet 0   Glucosamine-Chondroitin (COSAMIN DS PO)  Take 1 tablet by mouth 5 (five) times daily.     ibuprofen (ADVIL) 600 MG tablet TAKE 1 TABLET BY MOUTH EVERY 8 HOURS AS NEEDED. (Patient taking differently: Take 600 mg by mouth every 8 (eight) hours as needed for moderate pain, mild pain or headache.) 90 tablet 3   levETIRAcetam (KEPPRA) 500 MG tablet Take 1 tablet (500 mg total) by mouth 2 (two) times daily. 180 tablet 4   lidocaine-prilocaine (EMLA) cream Apply 1 application topically as needed. 30 g 0   lipase/protease/amylase (CREON) 36000 UNITS CPEP capsule TAKE 1 CAPSULE BY MOUTH WITH BREAKFAST, WITH LUNCH, AND WITH EVENING MEAL AND  1 CAPSULE WITH SNACKS. (Patient taking differently: Take 36,000 Units by mouth 3 (three) times daily with meals.) 540 capsule 2   loratadine (CLARITIN) 10 MG tablet Take 10 mg by mouth daily.     Misc Natural Products (LEG VEIN & CIRCULATION) TABS Take 1 tablet by mouth 2 (two) times daily.      Multiple Vitamins-Minerals (HAIR SKIN AND NAILS FORMULA) TABS Take 1 tablet by mouth 2 (two) times daily.      Multiple Vitamins-Minerals (MULTIVITAMIN PO) Take 1 tablet by mouth daily.      nystatin (MYCOSTATIN/NYSTOP) powder Apply 1 application topically 3 (three) times daily as needed. For rash/raw skin as needed 15 g 2   omeprazole (PRILOSEC) 40 MG capsule TAKE 1 CAPSULE (40 MG TOTAL) BY MOUTH DAILY. (Patient taking differently: Take 40 mg by mouth daily. Marland Kitchen) 30 capsule 0   oxybutynin (DITROPAN-XL) 10 MG 24 hr tablet TAKE 1 TABLET BY MOUTH TWICE A DAY (Patient taking differently: Take 10 mg by mouth in the morning and at bedtime.) 180 tablet 4   polyethylene glycol (MIRALAX / GLYCOLAX) 17 g packet Take 17 g by mouth daily.     REBIF REBIDOSE 44 MCG/0.5ML SOAJ Inject  44 mcg Sub-q 3 times weekly Rotate site after each injection (Patient taking differently: Inject 44 mcg into the skin 3 (three) times a week. Rotate site after each injection) 0.5 mL 11   tiZANidine (ZANAFLEX) 4 MG tablet TAKE 1 TABLET (4 MG TOTAL) BY MOUTH 2  (TWO) TIMES DAILY. (Patient taking differently: Take 4 mg by mouth in the morning and at bedtime.) 180 tablet 1   Turmeric 500 MG CAPS Take 500 mg by mouth daily.     Disposable Gloves (ASSURANCE VINYL EXAM GLOVES) MISC Neurogenic bladder; MS; LON 99 months; for use for personal hygiene 50 each 11     ROS: Constitutional: No fever or chills Vision: No changes in vision ENT: No difficulty swallowing CV: No chest pain Pulm: No SOB or wheezing GI: No nausea or vomiting GU: +incontinence  Skin: No poor wound healing Neurologic: No numbness or tingling Psychiatric: No depression or anxiety Heme: No bruising Allergic: No reaction to medications or food   Exam: Blood pressure (!) 143/67, pulse 83, temperature 98.1 F (36.7 C), temperature source Oral, resp. rate 20, height 5\' 4"  (1.626 m), weight 97.1 kg, SpO2 95 %. General: No acute distress Orientation: Awake alert and oriented x3 Mood and Affect: Cooperative and pleasant Gait: Unable to assess due to her fracture Coordination and balance: Within normal limits  Left lower extremity: Knee immobilizer is in place.  There is a compressive wrap over the leg.  She does have some moderate lymphedema in her lower extremity.  She does have active dorsiflexion plantarflexion of his foot and ankle.  She does endorse sensation to the foot.  She has a warm well-perfused foot with brisk cap refill of less than 2 seconds.  Right lower extremity: Skin without lesions. No tenderness to palpation. Full painless ROM, full strength in each muscle groups without evidence of instability.   Medical Decision Making: Data: Imaging: X-rays and CT scan are reviewed which shows a comminuted proximal tibia fracture with significant displacement and angulation.  Labs:  Results for orders placed or performed during the hospital encounter of 12/08/20 (from the past 24 hour(s))  Surgical pcr screen     Status: Abnormal   Collection Time: 12/08/20  3:36 PM    Specimen: Nasal Mucosa; Nasal Swab  Result Value  Ref Range   MRSA, PCR NEGATIVE NEGATIVE   Staphylococcus aureus POSITIVE (A) NEGATIVE  HIV Antibody (routine testing w rflx)     Status: None   Collection Time: 12/08/20  6:59 PM  Result Value Ref Range   HIV Screen 4th Generation wRfx Non Reactive Non Reactive  CBC     Status: Abnormal   Collection Time: 12/09/20  2:28 AM  Result Value Ref Range   WBC 4.7 4.0 - 10.5 K/uL   RBC 2.75 (L) 3.87 - 5.11 MIL/uL   Hemoglobin 8.2 (L) 12.0 - 15.0 g/dL   HCT 24.9 (L) 36.0 - 46.0 %   MCV 90.5 80.0 - 100.0 fL   MCH 29.8 26.0 - 34.0 pg   MCHC 32.9 30.0 - 36.0 g/dL   RDW 12.9 11.5 - 15.5 %   Platelets 121 (L) 150 - 400 K/uL   nRBC 0.0 0.0 - 0.2 %  Basic metabolic panel     Status: Abnormal   Collection Time: 12/09/20  2:28 AM  Result Value Ref Range   Sodium 133 (L) 135 - 145 mmol/L   Potassium 3.6 3.5 - 5.1 mmol/L   Chloride 103 98 - 111 mmol/L   CO2 26 22 - 32 mmol/L   Glucose, Bld 118 (H) 70 - 99 mg/dL   BUN 12 6 - 20 mg/dL   Creatinine, Ser 0.52 0.44 - 1.00 mg/dL   Calcium 8.2 (L) 8.9 - 10.3 mg/dL   GFR, Estimated >60 >60 mL/min   Anion gap 4 (L) 5 - 15   *Note: Due to a large number of results and/or encounters for the requested time period, some results have not been displayed. A complete set of results can be found in Results Review.     Imaging or Labs ordered: None  Medical history and chart was reviewed and case discussed with medical provider.  Assessment/Plan: 60 year old female with a history of MS with chronic lymphedema in bilateral lower extremities with a comminuted proximal tibia/tibial plateau fracture.  Due to the unstable nature of her injury I recommend proceeding with open reduction internal fixation.  She has a complex problem due to her lymphedema and her MS.  We will plan for open reduction internal fixation.  Risks and benefits were discussed with the patient.  Risks included but not limited to bleeding,  infection, malunion, nonunion, hardware failure, hardware irritation, nerve or blood vessel injury, DVT, even the possibility anesthetic complications.  She agrees to proceed with surgery and consent was obtained.  Shona Needles, MD [facility] [phone] (office) [email or web address]

## 2020-12-09 NOTE — Anesthesia Postprocedure Evaluation (Signed)
Anesthesia Post Note  Patient: Rebecca [name]  Procedure(s) Performed: LEFT OPEN REDUCTION INTERNAL FIXATION (ORIF) TIBIAL PLATEAU (Left)     Patient location during evaluation: PACU Anesthesia Type: General Level of consciousness: awake and alert Pain management: pain level controlled Vital Signs Assessment: post-procedure vital signs reviewed and stable Respiratory status: spontaneous breathing, nonlabored ventilation, respiratory function stable and patient connected to nasal cannula oxygen Cardiovascular status: blood pressure returned to baseline and stable Postop Assessment: no apparent nausea or vomiting Anesthetic complications: no   No notable events documented.  Last Vitals:  Vitals:   12/09/20 1055 12/09/20 1418  BP: 124/68 109/63  Pulse: 88 91  Resp: 20 19  Temp:  36.4 C  SpO2: 95% 96%    Last Pain:  Vitals:   12/09/20 1418  TempSrc: Oral  PainSc:                  Tiajuana Amass

## 2020-12-10 ENCOUNTER — Inpatient Hospital Stay (HOSPITAL_COMMUNITY): Payer: Medicare Other

## 2020-12-10 ENCOUNTER — Encounter (HOSPITAL_COMMUNITY): Payer: Self-pay | Admitting: Student

## 2020-12-10 DIAGNOSIS — S82202A Unspecified fracture of shaft of left tibia, initial encounter for closed fracture: Secondary | ICD-10-CM | POA: Diagnosis not present

## 2020-12-10 DIAGNOSIS — S82402A Unspecified fracture of shaft of left fibula, initial encounter for closed fracture: Secondary | ICD-10-CM | POA: Diagnosis not present

## 2020-12-10 LAB — CBC WITH DIFFERENTIAL/PLATELET
Abs Immature Granulocytes: 0.04 10*3/uL (ref 0.00–0.07)
Basophils Absolute: 0 10*3/uL (ref 0.0–0.1)
Basophils Relative: 0 %
Eosinophils Absolute: 0 10*3/uL (ref 0.0–0.5)
Eosinophils Relative: 0 %
HCT: 22 % — ABNORMAL LOW (ref 36.0–46.0)
Hemoglobin: 7.4 g/dL — ABNORMAL LOW (ref 12.0–15.0)
Immature Granulocytes: 1 %
Lymphocytes Relative: 12 %
Lymphs Abs: 0.8 10*3/uL (ref 0.7–4.0)
MCH: 30.2 pg (ref 26.0–34.0)
MCHC: 33.6 g/dL (ref 30.0–36.0)
MCV: 89.8 fL (ref 80.0–100.0)
Monocytes Absolute: 0.6 10*3/uL (ref 0.1–1.0)
Monocytes Relative: 10 %
Neutro Abs: 4.8 10*3/uL (ref 1.7–7.7)
Neutrophils Relative %: 77 %
Platelets: 128 10*3/uL — ABNORMAL LOW (ref 150–400)
RBC: 2.45 MIL/uL — ABNORMAL LOW (ref 3.87–5.11)
RDW: 12.4 % (ref 11.5–15.5)
WBC: 6.2 10*3/uL (ref 4.0–10.5)
nRBC: 0 % (ref 0.0–0.2)

## 2020-12-10 LAB — COMPREHENSIVE METABOLIC PANEL
ALT: 235 U/L — ABNORMAL HIGH (ref 0–44)
AST: 175 U/L — ABNORMAL HIGH (ref 15–41)
Albumin: 2.8 g/dL — ABNORMAL LOW (ref 3.5–5.0)
Alkaline Phosphatase: 161 U/L — ABNORMAL HIGH (ref 38–126)
Anion gap: 5 (ref 5–15)
BUN: 13 mg/dL (ref 6–20)
CO2: 28 mmol/L (ref 22–32)
Calcium: 8.3 mg/dL — ABNORMAL LOW (ref 8.9–10.3)
Chloride: 101 mmol/L (ref 98–111)
Creatinine, Ser: 0.53 mg/dL (ref 0.44–1.00)
GFR, Estimated: >60 mL/min (ref >60)
Glucose, Bld: 140 mg/dL — ABNORMAL HIGH (ref 70–99)
Potassium: 4 mmol/L (ref 3.5–5.1)
Sodium: 134 mmol/L — ABNORMAL LOW (ref 135–145)
Total Bilirubin: 0.7 mg/dL (ref 0.3–1.2)
Total Protein: 5.7 g/dL — ABNORMAL LOW (ref 6.5–8.1)

## 2020-12-10 LAB — HEPATITIS PANEL, ACUTE
HCV Ab: NONREACTIVE
Hep A IgM: NONREACTIVE
Hep B C IgM: NONREACTIVE
Hepatitis B Surface Ag: NONREACTIVE

## 2020-12-10 MED ORDER — BACLOFEN 10 MG PO TABS
20.0000 mg | ORAL_TABLET | Freq: Two times a day (BID) | ORAL | Status: DC
  Administered 2020-12-10 – 2020-12-18 (×17): 20 mg via ORAL
  Filled 2020-12-10 (×17): qty 2

## 2020-12-10 MED ORDER — ENOXAPARIN SODIUM 40 MG/0.4ML IJ SOSY
40.0000 mg | PREFILLED_SYRINGE | INTRAMUSCULAR | Status: DC
  Administered 2020-12-11 – 2020-12-18 (×8): 40 mg via SUBCUTANEOUS
  Filled 2020-12-10 (×8): qty 0.4

## 2020-12-10 MED ORDER — BACLOFEN 10 MG PO TABS
10.0000 mg | ORAL_TABLET | Freq: Every day | ORAL | Status: DC
  Administered 2020-12-10 – 2020-12-18 (×9): 10 mg via ORAL
  Filled 2020-12-10 (×8): qty 1

## 2020-12-10 MED ORDER — TIZANIDINE HCL 4 MG PO TABS
4.0000 mg | ORAL_TABLET | Freq: Every day | ORAL | Status: DC
  Administered 2020-12-11 – 2020-12-18 (×8): 4 mg via ORAL
  Filled 2020-12-10 (×9): qty 1

## 2020-12-10 MED ORDER — ENSURE ENLIVE PO LIQD
237.0000 mL | ORAL | Status: DC
  Administered 2020-12-10 – 2020-12-17 (×4): 237 mL via ORAL

## 2020-12-10 MED ORDER — VITAMIN D 25 MCG (1000 UNIT) PO TABS
1000.0000 [IU] | ORAL_TABLET | Freq: Every day | ORAL | Status: DC
  Administered 2020-12-10 – 2020-12-18 (×9): 1000 [IU] via ORAL
  Filled 2020-12-10 (×9): qty 1

## 2020-12-10 MED ORDER — ADULT MULTIVITAMIN W/MINERALS CH
1.0000 | ORAL_TABLET | Freq: Every day | ORAL | Status: DC
  Administered 2020-12-11 – 2020-12-18 (×8): 1 via ORAL
  Filled 2020-12-10 (×8): qty 1

## 2020-12-10 NOTE — Progress Notes (Signed)
Initial Nutrition Assessment  DOCUMENTATION CODES:  Obesity unspecified  INTERVENTION:  Obtain updated weight.  Add Ensure Plus High Protein po daily, each supplement provides 350 kcal and 20 grams of protein.  Add MVI with minerals daily.   NUTRITION DIAGNOSIS:  Increased nutrient needs related to post-op healing as evidenced by estimated needs.  GOAL:  Patient will meet greater than or equal to 90% of their needs  MONITOR:  PO intake, Supplement acceptance, Labs, Weight trends, Skin, I & O's  REASON FOR ASSESSMENT:  Consult Hip fracture protocol  ASSESSMENT:  60 yo female with a PMH of  MS, seizure, breast cancer in 2015, bilateral lower extremity lymphedema, neurogenic bladder with chronic Foley, history of bacterial endocarditis resulting in mild AS/AI who lives at home and is at baseline only able to transfer self from bed to chair who presents with a L tibia/fibula fracture.  Spoke with pt at bedside. Pt reports no appetite changes, weight changes, or changes in intake. She reports being hungry for dinner tonight and looking forward to going home.  Per Epic, pt ate 100% of dinner last night.  Per Epic, pt's weight appears to be copied and stated for over the past year. RD to order measured weight to determine any changes in weight.  Of note, pt with moderate BLE edema.  Recommend adding Ensure daily and MVI with minerals daily.  Medications: reviewed; Vitamin D3, colace BID, Keppra BID, Creon TID, Protonix, miralax, Vicodin PO PRN (given twice today)   Labs: reviewed; Na 134 (L), Glucose 140 (H)   NUTRITION - FOCUSED PHYSICAL EXAM: Flowsheet Row Most Recent Value  Orbital Region No depletion  Upper Arm Region No depletion  Thoracic and Lumbar Region No depletion  Buccal Region No depletion  Temple Region No depletion  Clavicle Bone Region No depletion  Clavicle and Acromion Bone Region No depletion  Scapular Bone Region No depletion  Dorsal Hand No depletion   Patellar Region No depletion  Anterior Thigh Region No depletion  Posterior Calf Region No depletion  Edema (RD Assessment) Moderate  [BLE]  Hair Reviewed  Eyes Reviewed  Mouth Reviewed  Skin Reviewed  Nails Reviewed   Diet Order:   Diet Order             Diet Heart Room service appropriate? Yes; Fluid consistency: Thin  Diet effective now                  EDUCATION NEEDS:  Education needs have been addressed  Skin:  Skin Assessment: Skin Integrity Issues: Skin Integrity Issues:: Incisions Incisions: L leg, closed  Last BM:  12/05/20  Height:  Ht Readings from Last 1 Encounters:  12/09/20 5\' 4"  (1.626 m)   Weight:  Wt Readings from Last 1 Encounters:  12/09/20 97.1 kg   BMI:  Body mass index is 36.73 kg/m.  Estimated Nutritional Needs:  Kcal:  1800-2000 Protein:  110-125 grams Fluid:  >1.8 L  Derrel Nip, RD, LDN (she/her/hers) Clinical Inpatient Dietitian RD Pager/After-Hours/Weekend Pager # in [place]

## 2020-12-10 NOTE — Progress Notes (Signed)
Orthopaedic Trauma Progress Note  SUBJECTIVE: Doing ok this AM. Pain controlled but having a lot of muscle spasms. I have adjusted muscle relaxers to home dose regimen. No chest pain. No SOB. No nausea/vomiting. No other complaints. Patient notes that her indwelling catheter is scheduled to be changed today.   OBJECTIVE:  Vitals:   12/09/20 1418 12/09/20 2127  BP: 109/63 (!) 142/64  Pulse: 91 96  Resp: 19 16  Temp: 97.6 F (36.4 C) 98.9 F (37.2 C)  SpO2: 96% 97%    General: Sitting up in bed, NAD Respiratory: No increased work of breathing.  LLE: Dressing CDI. Able to wiggle toes. Endorses sensation throughout extremity. Foot warm and well perfused. Tolerates gentle ankle ROM. +DP pulse  IMAGING: Stable post op imaging.   LABS:  Results for orders placed or performed during the hospital encounter of 12/08/20 (from the past 24 hour(s))  Type and screen [place]     Status: None   Collection Time: 12/09/20  8:30 AM  Result Value Ref Range   ABO/RH(D) O POS    Antibody Screen NEG    Sample Expiration      12/12/2020,2359 Performed at [facility], [place] [address], [place], [place] [address]   CBC with Differential/Platelet     Status: Abnormal   Collection Time: 12/10/20  5:01 AM  Result Value Ref Range   WBC 6.2 4.0 - 10.5 K/uL   RBC 2.45 (L) 3.87 - 5.11 MIL/uL   Hemoglobin 7.4 (L) 12.0 - 15.0 g/dL   HCT 22.0 (L) 36.0 - 46.0 %   MCV 89.8 80.0 - 100.0 fL   MCH 30.2 26.0 - 34.0 pg   MCHC 33.6 30.0 - 36.0 g/dL   RDW 12.4 11.5 - 15.5 %   Platelets 128 (L) 150 - 400 K/uL   nRBC 0.0 0.0 - 0.2 %   Neutrophils Relative % 77 %   Neutro Abs 4.8 1.7 - 7.7 K/uL   Lymphocytes Relative 12 %   Lymphs Abs 0.8 0.7 - 4.0 K/uL   Monocytes Relative 10 %   Monocytes Absolute 0.6 0.1 - 1.0 K/uL   Eosinophils Relative 0 %   Eosinophils Absolute 0.0 0.0 - 0.5 K/uL   Basophils Relative 0 %   Basophils Absolute 0.0 0.0 - 0.1 K/uL   Immature Granulocytes 1 %    Abs Immature Granulocytes 0.04 0.00 - 0.07 K/uL  Comprehensive metabolic panel     Status: Abnormal   Collection Time: 12/10/20  5:01 AM  Result Value Ref Range   Sodium 134 (L) 135 - 145 mmol/L   Potassium 4.0 3.5 - 5.1 mmol/L   Chloride 101 98 - 111 mmol/L   CO2 28 22 - 32 mmol/L   Glucose, Bld 140 (H) 70 - 99 mg/dL   BUN 13 6 - 20 mg/dL   Creatinine, Ser 0.53 0.44 - 1.00 mg/dL   Calcium 8.3 (L) 8.9 - 10.3 mg/dL   Total Protein 5.7 (L) 6.5 - 8.1 g/dL   Albumin 2.8 (L) 3.5 - 5.0 g/dL   AST 175 (H) 15 - 41 U/L   ALT 235 (H) 0 - 44 U/L   Alkaline Phosphatase 161 (H) 38 - 126 U/L   Total Bilirubin 0.7 0.3 - 1.2 mg/dL   GFR, Estimated >60 >60 mL/min   Anion gap 5 5 - 15   *Note: Due to a large number of results and/or encounters for the requested time period, some results have not  been displayed. A complete set of results can be found in Results Review.    ASSESSMENT: Rebecca [name] is a 60 y.o. female, 1 Day Post-Op s/p ORIF LEFT TIBIAL PLATEAU  CV/Blood loss: Acute blood loss anemia, Hgb 7.4 this AM. Slightly hypertensive this AM but otherwise hemodynamically stable   PLAN: Weightbearing: TDWB LLE Incisional and dressing care: Reinforce dressings as needed  Showering: Ok to begin showering 12/12/20. Incisions may get wet at that point Orthopedic device(s): None  Pain management:  1. Tylenol 325-650 mg q 6 hours PRN 2. Baclofen 20 mg at breakfast, 10 mg at lunch, 20 mg at bedtime  3. Norco 5-325 mg OR 7.5-325 mg mg q 4 hours PRN 4. Zanaflex 4 mg QD 5. Morphine 0.5-1 mg q 2 hours PRN VTE prophylaxis: Hold Lovenox today until hgb stabilizes, SCDs ID:  Ancef 2gm post op Foley/Lines: Indwelling catheter in place. KVO IVFs Impediments to Fracture Healing: Vit D level 28, start on D3 supplementation Dispo: PT/OT eval today, dispo pending. Patient would like to d/c home is able. Plan to remove dressing tomorrow. Continue to monitor CBC, start Lovenox once Hgb stable Follow - up  plan: 2 weeks after d/c for repeat x-rays and wound check  Contact information:  Katha Hamming MD, Patrecia Pace PA-C. After hours and holidays please check [email or web address] for group call information for [facility]   Elfreda Blanchet A. Ricci Barker, PA-C [phone] (office) [email or web address]

## 2020-12-10 NOTE — Progress Notes (Signed)
PROGRESS NOTE  Rebecca [name]  DOB: 1960-06-09  PCP: Delsa Grana, PA-C QIW:[id number]  DOA: 12/08/2020  LOS: 2 days  Hospital Day: 3  Chief complaint: Fall   Brief narrative: Rebecca [name] is a 60 y.o. 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 On Saturday 10/30, patient got up from her potty chair and attempted to move to the lift chair with a walker.  In the process, her left leg got stuck on the carpet because it twisted awkwardly and felt a snap on her leg.  EMS came and lifted her back into the lift chair versus declined across port to ED at that time.  She was unable to get back up since then and hence he called EMS again 11/31.  She could not stand at all and hence agreed to be brought to the ED at [facility].  Imagings of left knee showed severely displaced and comminuted proximal left tibial fracture which appears to extend to the articular surface of the lateral tibial plateau.  Also noted to have moderately displaced and comminuted proximal left fibula fracture.   Because of the complexity of her fracture, patient was transferred to [facility] Admitted to hospitalist service. On 11/2, patient underwent ORIF of left tibial plateau fracture by Dr. Doreatha Martin   Subjective: Patient was seen and examined this morning.  Pleasant Caucasian female.  Lying down in bed.  Not in distress.  Pain controlled. We discussed about her elevated liver enzymes.  Denies any history of chronic varices, liver cirrhosis, fatty liver.  Assessment/Plan: Left tibia/left fibula fracture -Sustained fracture after mechanical fall. -11/2, underwent ORIF of left tibial fracture. -DVT prophylaxis and pain per orthopedics team. -PT  eval obtained.  SNF recommended.  Elevated liver enzymes -Unclear cause.  Denies chronic hepatitis, any knowledge of fatty liver or liver cirrhosis.  Had cholecystectomy several years ago.  Chronically on Creon for IBS. -Obtain acute hypertension panel.  Obtain right upper quadrant ultrasound.  Repeat liver enzymes and INR tomorrow. Recent Labs  Lab 12/08/20 1222 12/10/20 0501  AST 156* 175*  ALT 108* 235*  ALKPHOS 131* 161*  BILITOT 1.1 0.7  PROT 6.8 5.7*  ALBUMIN 3.4* 2.8*     Multiple sclerosis -She is minimally ambulatory at baseline -Continue amantadine, Baclofen, Zanaflex -She is on Rebif 3x/week   Seizure d/o -Continue Keppra   Neurogenic bladder -Has an indwelling foley -Continue Ditropan.  Not on chronic antibiotics.   Obesity -Body mass index is 36.73 kg/m. Patient has been advised to make an attempt to improve diet and exercise patterns to aid in weight loss.   Mobility: Encourage ambulation.  PT eval pending Living condition: Lives at home Goals of care:   Code Status: Full Code  Nutritional status: Body mass index is 36.73 kg/m.     Diet:  Diet Order             Diet Heart Room service appropriate? Yes; Fluid consistency: Thin  Diet effective now                  DVT prophylaxis:  enoxaparin (LOVENOX) injection 40 mg Start: 12/11/20 1000 SCDs Start: 12/09/20 1120 SCDs Start: 12/08/20 1809   Antimicrobials:. Peri op antibiotics per orthopedics  Fluid: Can stop IV fluid today. Consultants: Orthopedics Family Communication: None at bedside  Status is: Inpatient  Remains inpatient appropriate because: POD 1.  Work-up for elevated liver enzymes  Dispo: The patient is from: Home              Anticipated d/c is to: SNF per PT              Patient currently is not medically stable to d/c.   Difficult to place patient No     Infusions:     Scheduled Meds:  amantadine  100 mg Oral TID   baclofen  10 mg Oral Q lunch   baclofen  20 mg  Oral BID AC & HS   Chlorhexidine Gluconate Cloth  6 each Topical Q0600   cholecalciferol  1,000 Units Oral Daily   COVID-19 mRNA bivalent vaccine (Moderna)  0.5 mL Intramuscular ONCE-1600   docusate sodium  100 mg Oral BID   [START ON 12/11/2020] enoxaparin (LOVENOX) injection  40 mg Subcutaneous Q24H   levETIRAcetam  500 mg Oral BID   lipase/protease/amylase  36,000 Units Oral TID WC   loratadine  10 mg Oral Daily   mupirocin ointment  1 application Nasal BID   oxybutynin  10 mg Oral BID   pantoprazole  40 mg Oral Daily   polyethylene glycol  17 g Oral Daily   [START ON 12/11/2020] tiZANidine  4 mg Oral Q0200    PRN meds: acetaminophen, bisacodyl, HYDROcodone-acetaminophen, HYDROcodone-acetaminophen, metoCLOPramide **OR** metoCLOPramide (REGLAN) injection, morphine injection, ondansetron **OR** ondansetron (ZOFRAN) IV, polyethylene glycol, sodium chloride flush   Antimicrobials: Anti-infectives (From admission, onward)    Start     Dose/Rate Route Frequency Ordered Stop   12/09/20 1700  ceFAZolin (ANCEF) IVPB 2g/100 mL premix        2 g 200 mL/hr over 30 Minutes Intravenous Every 8 hours 12/09/20 1120 12/10/20 0904   12/09/20 0942  vancomycin (VANCOCIN) powder  Status:  Discontinued          As needed 12/09/20 0942 12/09/20 1004       Objective: Vitals:   12/09/20 2127 12/10/20 0756  BP: (!) 142/64 121/64  Pulse: 96 79  Resp: 16 17  Temp: 98.9 F (37.2 C) 98.5 F (36.9 C)  SpO2: 97% 98%    Intake/Output Summary (Last 24 hours) at 12/10/2020 1003 Last data filed at 12/10/2020 0757 Gross per 24 hour  Intake 340 ml  Output 1950 ml  Net -1610 ml   Filed Weights   12/08/20 1628 12/09/20 0747  Weight: 97.1 kg 97.1 kg   Weight change: -0 kg Body mass index is 36.73 kg/m.   Physical Exam: General exam: Pleasant, middle-aged Caucasian female.  Not in physical distress Skin: No rashes, lesions or ulcers. HEENT: Atraumatic, normocephalic, no obvious bleeding Lungs:  Clear to auscultation bilaterally CVS: Regular rate and rhythm, mild ejection systolic murmur GI/Abd soft, nontender, nondistended, bowel sound present CNS: Alert, awake, oriented x3 Psychiatry: Mood appropriate Extremities: No pedal edema, no calf tenderness  Data Review: I have personally reviewed the laboratory data and studies available.  F/u labs ordered Unresulted Labs (From admission, onward)     Start     Ordered   12/16/20 0500  Creatinine, serum  (enoxaparin (LOVENOX)    CrCl >/= 30 ml/min)  Weekly,   R     Comments: while on enoxaparin therapy    12/09/20 1120   12/11/20 0500  Protime-INR  Tomorrow morning,   R  Question:  Specimen collection method  Answer:  IV Team=IV Team collect   12/10/20 1002   12/10/20 0805  Hepatitis panel, acute  Add-on,   AD       Question:  Specimen collection method  Answer:  IV Team=IV Team collect   12/10/20 0805   12/10/20 0500  CBC with Differential/Platelet  Daily,   R      12/09/20 0849   12/10/20 0500  Comprehensive metabolic panel  Daily,   R      12/09/20 0849   12/10/20 6203  Basic metabolic panel  Daily,   R      12/09/20 1120   12/10/20 0500  CBC  Daily,   R      12/09/20 1120            Signed, Terrilee Croak, MD [facility] 12/10/2020

## 2020-12-10 NOTE — Progress Notes (Signed)
Physical Therapy Treatment Patient Details Name: Rebecca [name] MRN: [id number] DOB: 08-17-1960 Today's Date: 12/10/2020   History of Present Illness 60 yo female presents to [facility] on 10/31 with fall and LLE pain, sustained L comminuted proximal tibia/tibial plateau fracture. s/p ORIF L tibial plateau fx on 11/2. PMH includes endocarditis with resultant mild AS, breast cancer (2015), MS, IBS, ACDF 2013, and seizures.    PT Comments    Pt was assisted to side of bed to attempt scooting transfer to chair, but was not mobile enough to use UE's to scoot to chair.  CNA arrived and asked to try standing, but pt immediately extends RLE and cannot safely attempt.  Sat pt long sitting on bed and minimally reclined chair next to bed, and with three person help scooted her to the chair.  Pt is comfortable and positioned well, LLE elevated and discussed sliding back to bed with CNA and RN.  Follow acutely for goals of PT.  SNF plan is still most reasonable for this patient.   Recommendations for follow up therapy are one component of a multi-disciplinary discharge planning process, led by the attending physician.  Recommendations may be updated based on patient status, additional functional criteria and insurance authorization.  Follow Up Recommendations  Skilled nursing-short term rehab (<3 hours/day)     Assistance Recommended at Discharge Frequent or constant Supervision/Assistance  Equipment Recommendations  Wheelchair cushion (measurements PT);Wheelchair (measurements PT)    Recommendations for Other Services       Precautions / Restrictions Precautions Precautions: Fall Precaution Comments: TDWB on LLE Restrictions Weight Bearing Restrictions: Yes LLE Weight Bearing: Touchdown weight bearing Other Position/Activity Restrictions: RLE has extension tone with efforts to stand     Mobility  Bed Mobility Overal bed mobility: Needs Assistance Bed Mobility: Supine to Sit;Sit to Supine      Supine to sit: Mod assist;+2 for physical assistance;+2 for safety/equipment Sit to supine: Mod assist;Max assist;+2 for physical assistance;+2 for safety/equipment        Transfers Overall transfer level: Needs assistance Equipment used: Rolling walker (2 wheels);2 person hand held assist Transfers: Sit to/from Stand;Lateral/Scoot Transfers Sit to Stand: Total assist;From elevated surface          Lateral/Scoot Transfers: Mod assist;+2 physical assistance;+2 safety/equipment;From elevated surface General transfer comment: standing was halted by pt trying to exert and extending RLE out in front of her;  lateral scoot from long sitting to get to recliner    Ambulation/Gait             General Gait Details: unable   Stairs             Wheelchair Mobility    Modified Rankin (Stroke Patients Only)       Balance Overall balance assessment: Needs assistance;History of Falls Sitting-balance support: Feet supported;Single extremity supported Sitting balance-Leahy Scale: Fair     Standing balance support: Bilateral upper extremity supported;During functional activity Standing balance-Leahy Scale: Zero                              Cognition Arousal/Alertness: Awake/alert Behavior During Therapy: WFL for tasks assessed/performed Overall Cognitive Status: Within Functional Limits for tasks assessed                                 General Comments: participated in discussion about best transfer technique  Exercises      General Comments General comments (skin integrity, edema, etc.): pt was assisted to try standing initially at the urging of CNA who was sure pt could do this.  Pt extended RLE immediately and was at risk of sliding off bed, so quickly scooted backward and reverted to plan to scoot sideways from long sitting to moderately reclined chair.      Pertinent Vitals/Pain Pain Assessment: Faces Faces Pain Scale:  Hurts a little bit Breathing: normal Pain Location: LLE Pain Descriptors / Indicators: Discomfort;Guarding Pain Intervention(s): Limited activity within patient's tolerance;Monitored during session;Premedicated before session;Repositioned    Home Living                          Prior Function            PT Goals (current goals can now be found in the care plan section) Acute Rehab PT Goals Patient Stated Goal: return to PLOF Progress towards PT goals: Progressing toward goals    Frequency    Min 3X/week      PT Plan Current plan remains appropriate    Co-evaluation              AM-PAC PT "6 Clicks" Mobility   Outcome Measure  Help needed turning from your back to your side while in a flat bed without using bedrails?: A Lot Help needed moving from lying on your back to sitting on the side of a flat bed without using bedrails?: A Lot Help needed moving to and from a bed to a chair (including a wheelchair)?: Total Help needed standing up from a chair using your arms (e.g., wheelchair or bedside chair)?: Total Help needed to walk in hospital room?: Total Help needed climbing 3-5 steps with a railing? : Total 6 Click Score: 8    End of Session Equipment Utilized During Treatment: Gait belt Activity Tolerance: Patient tolerated treatment well;Patient limited by pain Patient left: in chair;with call bell/phone within reach;with chair alarm set;with nursing/sitter in room Nurse Communication: Mobility status PT Visit Diagnosis: Other abnormalities of gait and mobility (R26.89);Muscle weakness (generalized) (M62.81);Pain Pain - Right/Left: Left Pain - part of body: Leg     Time: 1217-1251 PT Time Calculation (min) (ACUTE ONLY): 34 min  Charges:  $Therapeutic Activity: 8-22 mins               Ramond Dial 12/10/2020, 3:53 PM  Mee Hives, PT PhD Acute Rehab Dept. Number: [place] and [place]

## 2020-12-10 NOTE — NC FL2 (Signed)
[place] LEVEL OF CARE SCREENING TOOL     IDENTIFICATION  Patient Name: Rebecca [name] Birthdate: 05-14-1960 Sex: female Admission Date (Current Location): 12/08/2020  [place] and [place] Number:  Kathleen Argue [id number] [place] and Address:  [facility] [place] [address] [place], [place] [address]      Provider Number: [id number]  Attending Physician Name and Address:  Terrilee Croak, MD  Relative Name and Phone Number:  Lauryl, Seyer (Spouse)   [phone]    Current Level of Care: Hospital Recommended Level of Care: [place] Prior Approval Number:    Date Approved/Denied:   PASRR Number: [id number]  Discharge Plan: SNF    Current Diagnoses: Patient Active Problem List   Diagnosis Date Noted   Tibia/fibula fracture, left, closed, initial encounter 12/08/2020   Dysphagia    Acute esophagitis    Gastric erythema    Mucosal abnormality of duodenum    Schatzki's ring    Diarrhea 04/14/2020   Elevated BP without diagnosis of hypertension 04/14/2020   Urinary tract infection associated with indwelling urethral catheter ([place]) 02/17/2020   Muscle spasm 02/28/2019   Post-menopausal bleeding 11/20/2018   Ambulatory dysfunction 02/02/2018   Abnormality of gait 11/17/2017   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   Bilateral lower extremity pain (primary) (bilateral) (right greater than left) 08/01/2016   Chronic neck pain (secondary) (bilateral) ( left greater than right) 07/28/2016   Neutropenia ([place]) 06/27/2016   Chronic pain syndrome 06/03/2016   Neurogenic bladder    Neurogenic bowel    Detrusor and sphincter dyssynergia    Urinary incontinence    Seizure disorder ([place])    Slow transit constipation    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HCC)    Lymphedema    Encounter for screening for cervical cancer 11/27/2015   Preventative health care 11/25/2015   History of breast cancer in female 10/28/2015    Anemia 10/28/2015   Thrombocytopenia ([place]) 10/28/2015   Allergic rhinitis 10/28/2015   IBS (irritable bowel syndrome) 10/28/2015   Uterine leiomyoma 10/24/2014   History of right mastectomy 10/24/2014   Medicare annual wellness visit, subsequent 10/24/2014   MS (multiple sclerosis) ([place]) 73/42/8768   Hematoma complicating a procedure 03/06/2014   Primary cancer of right female breast ([place]) 01/10/2014   SOB (shortness of breath) 03/01/2013   Bilateral lower extremity edema    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[place]) 06/08/2011    Orientation RESPIRATION BLADDER Height & Weight     Self, Place, Situation, Time  Normal Incontinent Weight: 214 lb (97.1 kg) Height:  5\' 4"  (162.6 cm)  BEHAVIORAL SYMPTOMS/MOOD NEUROLOGICAL BOWEL NUTRITION STATUS      Continent Diet (see d/c summary)  AMBULATORY STATUS COMMUNICATION OF NEEDS Skin   Extensive Assist Non-Verbally Surgical wounds                       Personal Care Assistance Level of Assistance  Dressing, Feeding, Bathing Bathing Assistance: Limited assistance Feeding assistance: Independent Dressing Assistance: Limited assistance     Functional Limitations Info  Hearing, Speech, Sight Sight Info: Adequate Hearing Info: Adequate Speech Info: Adequate    SPECIAL CARE FACTORS FREQUENCY  PT (By licensed PT), OT (By licensed OT)     PT Frequency: 5x/ week OT Frequency: 5x/ week            Contractures      Additional Factors Info  Code Status, Allergies Code Status Info: Full Allergies Info: Fentanyl  Sulfa Antibiotics           Current Medications (12/10/2020):  This is the current hospital active medication list Current Facility-Administered Medications  Medication Dose Route Frequency Provider Last Rate Last Admin   acetaminophen (TYLENOL) tablet 325-650 mg  325-650 mg Oral Q6H PRN Delray Alt, PA-C       amantadine (SYMMETREL) capsule 100 mg  100 mg Oral TID Patrecia Pace A, PA-C   100 mg at 12/10/20 0827    baclofen (LIORESAL) tablet 10 mg  10 mg Oral Q lunch Dahal, Marlowe Aschoff, MD   10 mg at 12/10/20 1306   baclofen (LIORESAL) tablet 20 mg  20 mg Oral BID AC & HS Delray Alt, PA-C   20 mg at 12/10/20 2956   bisacodyl (DULCOLAX) EC tablet 5 mg  5 mg Oral Daily PRN Delray Alt, PA-C       Chlorhexidine Gluconate Cloth 2 % PADS 6 each  6 each Topical Q0600 Delray Alt, PA-C   6 each at 12/10/20 1306   cholecalciferol (VITAMIN D3) tablet 1,000 Units  1,000 Units Oral Daily Delray Alt, PA-C   1,000 Units at 12/10/20 2130   COVID-19 mRNA bivalent vaccine (Moderna) injection 0.5 mL  0.5 mL Intramuscular ONCE-1600 Yacobi, Sarah A, PA-C       docusate sodium (COLACE) capsule 100 mg  100 mg Oral BID Patrecia Pace A, PA-C   100 mg at 12/10/20 0826   [START ON 12/11/2020] enoxaparin (LOVENOX) injection 40 mg  40 mg Subcutaneous Q24H Yacobi, Sarah A, PA-C       HYDROcodone-acetaminophen (NORCO) 7.5-325 MG per tablet 1-2 tablet  1-2 tablet Oral Q4H PRN Delray Alt, PA-C   2 tablet at 12/10/20 0511   HYDROcodone-acetaminophen (NORCO/VICODIN) 5-325 MG per tablet 1-2 tablet  1-2 tablet Oral Q4H PRN Delray Alt, PA-C   2 tablet at 12/10/20 1306   levETIRAcetam (KEPPRA) tablet 500 mg  500 mg Oral BID Delray Alt, PA-C   500 mg at 12/10/20 8657   lipase/protease/amylase (CREON) capsule 36,000 Units  36,000 Units Oral TID WC Delray Alt, PA-C   36,000 Units at 12/10/20 1306   loratadine (CLARITIN) tablet 10 mg  10 mg Oral Daily Delray Alt, PA-C   10 mg at 12/10/20 8469   metoCLOPramide (REGLAN) tablet 5-10 mg  5-10 mg Oral Q8H PRN Delray Alt, PA-C       Or   metoCLOPramide (REGLAN) injection 5-10 mg  5-10 mg Intravenous Q8H PRN Delray Alt, PA-C   10 mg at 12/09/20 1241   morphine 2 MG/ML injection 0.5-1 mg  0.5-1 mg Intravenous Q2H PRN Delray Alt, PA-C       mupirocin ointment (BACTROBAN) 2 % 1 application  1 application Nasal BID Delray Alt, PA-C   1 application at  62/95/28 0828   ondansetron (ZOFRAN) tablet 4 mg  4 mg Oral Q6H PRN Delray Alt, PA-C       Or   ondansetron [facility]) injection 4 mg  4 mg Intravenous Q6H PRN Delray Alt, PA-C       oxybutynin (DITROPAN-XL) 24 hr tablet 10 mg  10 mg Oral BID Patrecia Pace A, PA-C   10 mg at 12/10/20 0827   pantoprazole (PROTONIX) EC tablet 40 mg  40 mg Oral Daily Patrecia Pace A, PA-C   40 mg at 12/10/20 0826   polyethylene glycol (MIRALAX / GLYCOLAX) packet 17 g  17 g  Oral Daily Delray Alt, PA-C   17 g at 12/10/20 4695   polyethylene glycol (MIRALAX / GLYCOLAX) packet 17 g  17 g Oral Daily PRN Patrecia Pace A, PA-C       sodium chloride flush (NS) 0.9 % injection 10-40 mL  10-40 mL Intracatheter PRN Dahal, Marlowe Aschoff, MD       [START ON 12/11/2020] tiZANidine (ZANAFLEX) tablet 4 mg  4 mg Oral Q0200 Delray Alt, PA-C         Discharge Medications: Please see discharge summary for a list of discharge medications.  Relevant Imaging Results:  Relevant Lab Results:   Additional Information SSN:  [id number]; Moderna COVID-19 Vaccine 07/31/2019 , 07/02/2019; 5'4"  214lbs  Paulene Floor Nolah Krenzer, LCSWA

## 2020-12-10 NOTE — TOC Initial Note (Signed)
Transition of Care [facility]) - Initial/Assessment Note    Patient Details  Name: Rebecca [name] MRN: [id number] Date of Birth: August 20, 1960  Transition of Care [facility]) CM/SW Contact:    Milinda Antis, [place] Phone Number: 12/10/2020, 12:12 PM  Clinical Narrative:                 CSW received consult for possible SNF placement at time of discharge. CSW spoke with patient at bedside. Patient informed CSW that she spoke with her sister prior to admission about SNF and expressed understanding of PT recommendation and is agreeable to SNF placement at time of discharge. Patient reports preference for [facility]. CSW discussed insurance authorization process and will provide Medicare SNF ratings list. Patient has received 2 COVID vaccines. Patient expressed being hopeful for rehab and to feel better soon. No further questions reported at this time.   Skilled Nursing Rehab Facilities-   [email or web address]   Ratings out of 5 possible   Name Address  Phone # [place] Inspection Overall  [facility] [address], [place] 5 5 2 4   [facility]  [address], [place] [phone] 4 2 5 5   [facility] [place], [place] 4 1 1 1   [place], [place] 2 2 4 4   [facility] [address], [place] 2 1 1 1   [place] 3 1 4 3   [facility] [address], [place] 5 2 2 3   [facility] [address] [place] 4 1 2 1   [place] [facility] [place] 5 1 2 2   [facility] [id number], [place] [phone] 4 1 1 1   [facility] [address] [facility] [phone] 4 1 2 1   [place] [facility] [place] [phone] 4 1 1 1       Expected  Discharge Plan: Skilled Nursing Facility Barriers to Discharge: Continued Medical Work up   Patient Goals and CMS Choice        Expected Discharge Plan and Services Expected Discharge Plan: [place]                                              Prior Living Arrangements/Services                       Activities of Daily Living Home Assistive Devices/Equipment: Bedside commode/3-in-1, Transport planner, Environmental consultant (specify type) ADL Screening (condition at time of admission) Patient's cognitive ability adequate to safely complete daily activities?: Yes Is the patient deaf or have difficulty hearing?: No Does the patient have difficulty seeing, even when wearing glasses/contacts?: No Does the patient have difficulty concentrating, remembering, or making decisions?: No Patient able to express need for assistance with ADLs?: Yes Does the patient have difficulty dressing or bathing?: No Independently performs ADLs?: Yes (appropriate for developmental age) Does the patient have difficulty walking or climbing stairs?: Yes Weakness of Legs: Both Weakness of Arms/Hands: Both  Permission Sought/Granted                  Emotional Assessment              Admission diagnosis:  Fx up  tibia w/ fibula-closed [S82.109A, S82.409A] Tibia/fibula fracture, left, closed, initial encounter [V74.827M, S82.402A] Patient Active Problem List   Diagnosis Date Noted   Tibia/fibula fracture, left, closed, initial encounter 12/08/2020   Dysphagia    Acute esophagitis    Gastric erythema    Mucosal abnormality of duodenum    Schatzki's ring    Diarrhea 04/14/2020   Elevated BP without diagnosis of hypertension 04/14/2020   Urinary tract infection associated with indwelling urethral catheter ([place]) 02/17/2020   Muscle spasm 02/28/2019   Post-menopausal bleeding 11/20/2018   Ambulatory dysfunction 02/02/2018   Abnormality of gait 11/17/2017   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[place]) 09/08/2017   Bilateral lower extremity pain (primary) (bilateral) (right greater than left) 08/01/2016   Chronic neck pain (secondary) (bilateral) ( left greater than right) 07/28/2016   Neutropenia ([place]) 06/27/2016   Chronic pain syndrome 06/03/2016   Neurogenic bladder    Neurogenic bowel    Detrusor and sphincter dyssynergia    Urinary incontinence    Seizure disorder ([place])    Slow transit constipation    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[place])    Lymphedema    Encounter for screening for cervical cancer 11/27/2015   Preventative health care 11/25/2015   History of breast cancer in female 10/28/2015   Anemia 10/28/2015   Thrombocytopenia ([place]) 10/28/2015   Allergic rhinitis 10/28/2015   IBS (irritable bowel syndrome) 10/28/2015   Uterine leiomyoma 10/24/2014   History of right mastectomy 10/24/2014   Medicare annual wellness visit, subsequent 10/24/2014   MS (multiple sclerosis) ([place]) 78/67/5449   Hematoma complicating a procedure 03/06/2014   Primary cancer of right female breast ([place]) 01/10/2014   SOB (shortness of breath) 03/01/2013   Bilateral lower extremity edema    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[place]) 06/08/2011   PCP:  Delsa Grana, PA-C Pharmacy:   [id number] [place] [place] [facility] [address] [place] [address] Phone: [phone] Fax: [phone]  [place], [place] [address] [place] [address] Phone: [phone] Fax: [phone]     Social Determinants of Health (SDOH) Interventions    Readmission Risk Interventions No flowsheet data found.

## 2020-12-11 DIAGNOSIS — S82202A Unspecified fracture of shaft of left tibia, initial encounter for closed fracture: Secondary | ICD-10-CM | POA: Diagnosis not present

## 2020-12-11 DIAGNOSIS — S82402A Unspecified fracture of shaft of left fibula, initial encounter for closed fracture: Secondary | ICD-10-CM | POA: Diagnosis not present

## 2020-12-11 LAB — COMPREHENSIVE METABOLIC PANEL
ALT: 129 U/L — ABNORMAL HIGH (ref 0–44)
AST: 70 U/L — ABNORMAL HIGH (ref 15–41)
Albumin: 2.8 g/dL — ABNORMAL LOW (ref 3.5–5.0)
Alkaline Phosphatase: 141 U/L — ABNORMAL HIGH (ref 38–126)
Anion gap: 6 (ref 5–15)
BUN: 12 mg/dL (ref 6–20)
CO2: 28 mmol/L (ref 22–32)
Calcium: 8.4 mg/dL — ABNORMAL LOW (ref 8.9–10.3)
Chloride: 100 mmol/L (ref 98–111)
Creatinine, Ser: 0.45 mg/dL (ref 0.44–1.00)
GFR, Estimated: >60 mL/min (ref >60)
Glucose, Bld: 127 mg/dL — ABNORMAL HIGH (ref 70–99)
Potassium: 3.7 mmol/L (ref 3.5–5.1)
Sodium: 134 mmol/L — ABNORMAL LOW (ref 135–145)
Total Bilirubin: 0.9 mg/dL (ref 0.3–1.2)
Total Protein: 5.9 g/dL — ABNORMAL LOW (ref 6.5–8.1)

## 2020-12-11 LAB — LIPID PANEL
Cholesterol: 138 mg/dL (ref 0–200)
HDL: 48 mg/dL (ref >40)
LDL Cholesterol: 77 mg/dL (ref 0–99)
Total CHOL/HDL Ratio: 2.9 RATIO
Triglycerides: 63 mg/dL (ref <150)
VLDL: 13 mg/dL (ref 0–40)

## 2020-12-11 LAB — CBC WITH DIFFERENTIAL/PLATELET
Abs Immature Granulocytes: 0.05 10*3/uL (ref 0.00–0.07)
Basophils Absolute: 0 10*3/uL (ref 0.0–0.1)
Basophils Relative: 0 %
Eosinophils Absolute: 0.1 10*3/uL (ref 0.0–0.5)
Eosinophils Relative: 2 %
HCT: 21.7 % — ABNORMAL LOW (ref 36.0–46.0)
Hemoglobin: 7.3 g/dL — ABNORMAL LOW (ref 12.0–15.0)
Immature Granulocytes: 1 %
Lymphocytes Relative: 17 %
Lymphs Abs: 1 10*3/uL (ref 0.7–4.0)
MCH: 30.2 pg (ref 26.0–34.0)
MCHC: 33.6 g/dL (ref 30.0–36.0)
MCV: 89.7 fL (ref 80.0–100.0)
Monocytes Absolute: 0.6 10*3/uL (ref 0.1–1.0)
Monocytes Relative: 10 %
Neutro Abs: 4.1 10*3/uL (ref 1.7–7.7)
Neutrophils Relative %: 70 %
Platelets: 134 10*3/uL — ABNORMAL LOW (ref 150–400)
RBC: 2.42 MIL/uL — ABNORMAL LOW (ref 3.87–5.11)
RDW: 13 % (ref 11.5–15.5)
WBC: 5.8 10*3/uL (ref 4.0–10.5)
nRBC: 0.3 % — ABNORMAL HIGH (ref 0.0–0.2)

## 2020-12-11 LAB — PROTIME-INR
INR: 1.1 (ref 0.8–1.2)
Prothrombin Time: 14.1 seconds (ref 11.4–15.2)

## 2020-12-11 MED ORDER — SODIUM CHLORIDE 0.9 % IV SOLN
1.0000 g | INTRAVENOUS | Status: AC
  Administered 2020-12-11 – 2020-12-17 (×7): 1 g via INTRAVENOUS
  Filled 2020-12-11 (×7): qty 10

## 2020-12-11 MED ORDER — ENOXAPARIN SODIUM 40 MG/0.4ML IJ SOSY: 40.0000 mg | PREFILLED_SYRINGE | INTRAMUSCULAR | 0 refills | Status: DC

## 2020-12-11 MED ORDER — VITAMIN D3 25 MCG PO TABS: 1000.0000 [IU] | ORAL_TABLET | Freq: Every day | ORAL | 2 refills | Status: AC

## 2020-12-11 MED ORDER — HYDROCODONE-ACETAMINOPHEN 7.5-325 MG PO TABS
1.0000 | ORAL_TABLET | Freq: Four times a day (QID) | ORAL | 0 refills | Status: DC | PRN
Start: 2020-12-11 — End: 2021-04-28

## 2020-12-11 NOTE — Progress Notes (Signed)
Orthopaedic Trauma Progress Note  SUBJECTIVE: Resting comfortably in bed this morning.  No specific complaints.  Patient is able to work with therapies yesterday, therapies have recommended SNF which patient is agreeable to.  OBJECTIVE:  Vitals:   12/10/20 2008 12/11/20 0808  BP: (!) 160/71 116/61  Pulse: (!) 101 92  Resp: 16 17  Temp: 98.8 F (37.1 C) 99 F (37.2 C)  SpO2: 96% 96%    General: Sleeping comfortably in bed, NAD Respiratory: No increased work of breathing.  LLE: Dressing removed, incisions are CDI.  Notable swelling, bruising, redness to the lower leg.  Which is stable and relatively unchanged from preoperative exam.  Able to wiggle toes. Endorses sensation throughout extremity. Foot warm and well perfused. Tolerates gentle ankle ROM. +DP pulse  IMAGING: Stable post op imaging.   LABS:  Results for orders placed or performed during the hospital encounter of 12/08/20 (from the past 24 hour(s))  CBC with Differential/Platelet     Status: Abnormal   Collection Time: 12/11/20  3:32 AM  Result Value Ref Range   WBC 5.8 4.0 - 10.5 K/uL   RBC 2.42 (L) 3.87 - 5.11 MIL/uL   Hemoglobin 7.3 (L) 12.0 - 15.0 g/dL   HCT 21.7 (L) 36.0 - 46.0 %   MCV 89.7 80.0 - 100.0 fL   MCH 30.2 26.0 - 34.0 pg   MCHC 33.6 30.0 - 36.0 g/dL   RDW 13.0 11.5 - 15.5 %   Platelets 134 (L) 150 - 400 K/uL   nRBC 0.3 (H) 0.0 - 0.2 %   Neutrophils Relative % 70 %   Neutro Abs 4.1 1.7 - 7.7 K/uL   Lymphocytes Relative 17 %   Lymphs Abs 1.0 0.7 - 4.0 K/uL   Monocytes Relative 10 %   Monocytes Absolute 0.6 0.1 - 1.0 K/uL   Eosinophils Relative 2 %   Eosinophils Absolute 0.1 0.0 - 0.5 K/uL   Basophils Relative 0 %   Basophils Absolute 0.0 0.0 - 0.1 K/uL   Immature Granulocytes 1 %   Abs Immature Granulocytes 0.05 0.00 - 0.07 K/uL  Comprehensive metabolic panel     Status: Abnormal   Collection Time: 12/11/20  3:32 AM  Result Value Ref Range   Sodium 134 (L) 135 - 145 mmol/L   Potassium 3.7 3.5  - 5.1 mmol/L   Chloride 100 98 - 111 mmol/L   CO2 28 22 - 32 mmol/L   Glucose, Bld 127 (H) 70 - 99 mg/dL   BUN 12 6 - 20 mg/dL   Creatinine, Ser 0.45 0.44 - 1.00 mg/dL   Calcium 8.4 (L) 8.9 - 10.3 mg/dL   Total Protein 5.9 (L) 6.5 - 8.1 g/dL   Albumin 2.8 (L) 3.5 - 5.0 g/dL   AST 70 (H) 15 - 41 U/L   ALT 129 (H) 0 - 44 U/L   Alkaline Phosphatase 141 (H) 38 - 126 U/L   Total Bilirubin 0.9 0.3 - 1.2 mg/dL   GFR, Estimated >60 >60 mL/min   Anion gap 6 5 - 15  Protime-INR     Status: None   Collection Time: 12/11/20  3:32 AM  Result Value Ref Range   Prothrombin Time 14.1 11.4 - 15.2 seconds   INR 1.1 0.8 - 1.2   *Note: Due to a large number of results and/or encounters for the requested time period, some results have not been displayed. A complete set of results can be found in Results Review.    ASSESSMENT: Rebecca  DAYRIN [name] is a 60 y.o. female, 2 Days Post-Op s/p ORIF LEFT TIBIAL PLATEAU  CV/Blood loss: Acute blood loss anemia, Hgb 7.3 this AM.  Hemodynamically stable  PLAN: Weightbearing: TDWB LLE Incisional and dressing care: Changed today, continue to change Mepilex as needed. Showering: Ok to begin showering 12/12/20. Incisions may get wet at that point Orthopedic device(s): None  Pain management:  1. Tylenol 325-650 mg q 6 hours PRN 2. Baclofen 20 mg at breakfast, 10 mg at lunch, 20 mg at bedtime  3. Norco 5-325 mg OR 7.5-325 mg mg q 4 hours PRN 4. Zanaflex 4 mg QD 5. Morphine 0.5-1 mg q 2 hours PRN VTE prophylaxis: Hold Lovenox until hgb stabilizes, SCDs ID:  Ancef 2gm post op completed.  Patient started on ceftriaxone today for possible LLE cellulitis Foley/Lines: Indwelling catheter in place. KVO IVFs Impediments to Fracture Healing: Vit D level 28, started on D3 supplementation Dispo: Therapies as tolerated, PT/OT recommending SNF which patient is agreeable to.  Continue to monitor CBC, start Lovenox once Hgb stable.  TOC following for SNF placement  Follow - up plan:  2 weeks after d/c for repeat x-rays and wound check  Contact information:  Katha Hamming MD, Patrecia Pace PA-C. After hours and holidays please check [email or web address] for group call information for [facility]   Kelton Bultman A. Ricci Barker, PA-C [phone] (office) [email or web address]

## 2020-12-11 NOTE — TOC Progression Note (Signed)
Transition of Care [facility]) - Initial/Assessment Note    Patient Details  Name: Rebecca [name] MRN: [id number] Date of Birth: [birth year]  Transition of Care [facility]) CM/SW Contact:    Milinda Antis, [place] Phone Number: 12/11/2020, 1:32 PM  Clinical Narrative:                 CSW spoke with the patient to present bed offers.  CSW informed the patient that [facility] is unable to accept.  The patient informed CSW that she does not know about facilities in [place] and would look to her aunt to make a decision.    CSW called the patient's aunt.  The patient was present as well.  The family choose [facility].    CSW notified Perrin Smack with admissions that the family has decided to accept the offer.  Insurance auth initiated.    Expected Discharge Plan: Skilled Nursing Facility Barriers to Discharge: Continued Medical Work up   Patient Goals and CMS Choice        Expected Discharge Plan and Services Expected Discharge Plan: [place]                                              Prior Living Arrangements/Services                       Activities of Daily Living Home Assistive Devices/Equipment: Bedside commode/3-in-1, Transport planner, Environmental consultant (specify type) ADL Screening (condition at time of admission) Patient's cognitive ability adequate to safely complete daily activities?: Yes Is the patient deaf or have difficulty hearing?: No Does the patient have difficulty seeing, even when wearing glasses/contacts?: No Does the patient have difficulty concentrating, remembering, or making decisions?: No Patient able to express need for assistance with ADLs?: Yes Does the patient have difficulty dressing or bathing?: No Independently performs ADLs?: Yes (appropriate for developmental age) Does the patient have difficulty walking or climbing stairs?: Yes Weakness of Legs: Both Weakness of Arms/Hands: Both  Permission Sought/Granted                   Emotional Assessment              Admission diagnosis:  Fx up tibia w/ fibula-closed [S82.109A, S82.409A] Tibia/fibula fracture, left, closed, initial encounter [U38.453M, S82.402A] Patient Active Problem List   Diagnosis Date Noted   Tibia/fibula fracture, left, closed, initial encounter 12/08/2020   Dysphagia    Acute esophagitis    Gastric erythema    Mucosal abnormality of duodenum    Schatzki's ring    Diarrhea 04/14/2020   Elevated BP without diagnosis of hypertension 04/14/2020   Urinary tract infection associated with indwelling urethral catheter ([place]) 02/17/2020   Muscle spasm 02/28/2019   Post-menopausal bleeding 11/20/2018   Ambulatory dysfunction 02/02/2018   Abnormality of gait 11/17/2017   Spastic paraplegia secondary to multiple sclerosis ([place]) 09/08/2017   Bilateral lower extremity pain (primary) (bilateral) (right greater than left) 08/01/2016   Chronic neck pain (secondary) (bilateral) ( left greater than right) 07/28/2016   Neutropenia ([facility]) 06/27/2016   Chronic pain syndrome 06/03/2016   Neurogenic bladder    Neurogenic bowel    Detrusor and sphincter dyssynergia    Urinary incontinence    Seizure disorder ([facility])    Slow transit constipation    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[facility])    Lymphedema  Encounter for screening for cervical cancer 11/27/2015   Preventative health care 11/25/2015   History of breast cancer in female 10/28/2015   Anemia 10/28/2015   Thrombocytopenia ([place]) 10/28/2015   Allergic rhinitis 10/28/2015   IBS (irritable bowel syndrome) 10/28/2015   Uterine leiomyoma 10/24/2014   History of right mastectomy 10/24/2014   Medicare annual wellness visit, subsequent 10/24/2014   MS (multiple sclerosis) ([place]) 95/32/0233   Hematoma complicating a procedure 03/06/2014   Primary cancer of right female breast ([place]) 01/10/2014   SOB (shortness of breath) 03/01/2013   Bilateral lower extremity edema    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[place])  06/08/2011   PCP:  Delsa Grana, PA-C Pharmacy[facility]   [id number] [place] [place] - [address] [place] [address] Phone: [phone] Fax: [phone]  [place], [place] [address] [place] [address] Phone: [phone] Fax: [phone]     Social Determinants of Health (SDOH) Interventions    Readmission Risk Interventions No flowsheet data found.

## 2020-12-11 NOTE — Progress Notes (Signed)
Patient's husband called this evening.  Both patient and spouse is upset because home medication Rebif Redidose was provided yesterday to day staff and Pharmacy 12/10/2020 and patient still has not received her needed medication for her MS.  Paperwork is in the chart.  Medication was counted and provided to Pharmacy on 12/10/2020.    Now being told that MD must place order on [facility] in order to administer home supplied medication.   Sent Amion to on call for order.  2107 Pharmacy stated that they reached out to Ortho MD to get order to dispense medication and Ortho MD did not want to have patient re-start medication at this time.  Patient is willing to wait until tomorrow to have this all sorted out.

## 2020-12-11 NOTE — Consult Note (Addendum)
[facility] Nurse Consult Note: Patient receiving care in [facility] 5N6 Reason for Consult: weeping bilateral lower extremity cellulitis The RLE is edematous, but normal color and texture.  I did not find any wounds on the RLE, nor did I find any weeping areas.  The LLE, on the other hand, is very edematous, and discolored red and yellow.  Having never seen the LLE before, I do not know how the petechial type of redness and the yellow discoloration compares to prior days.  I do not feel comfortable writing orders for any type of treatment to the LLE.  The patient had Orthopedic surgery 2 days ago.  I explained my concerns in person to Dr. Marni Griffon, and that the Orthopedic Surgical providers need to be making the decisions on the LLE.  I have started a Secure Chat with Dr. Pietro Cassis, Dr. Doreatha Martin, and PA Patrecia Pace.    The patient explained to me that she has worn compression hose in the past, but the pair she has is too small, and she has not had any luck getting someone to her home to measure her legs and order new hose.  What I have ordered today is the following: Wash RLE leg with soap and water, dry. Apply moisturizing lotion, then beginning behind the toes and going to just below the knee, spiral wrap kerlix then an ace wrap.  [place] nurse will not follow at this time.  Please re-consult the [place] team if needed.  Val Riles, RN, MSN, CWOCN, CNS-BC, pager [phone]

## 2020-12-11 NOTE — Plan of Care (Signed)
?  Problem: Activity: ?Goal: Risk for activity intolerance will decrease ?Outcome: Progressing ?  ?Problem: Safety: ?Goal: Ability to remain free from injury will improve ?Outcome: Progressing ?  ?Problem: Pain Managment: ?Goal: General experience of comfort will improve ?Outcome: Progressing ?  ?

## 2020-12-11 NOTE — Progress Notes (Signed)
Physical Therapy Treatment Patient Details Name: Rebecca [name] MRN: [id number] DOB: [birth year] Today's Date: 12/11/2020   History of Present Illness 60 yo female presents [facility] on 10/[facility] with fall and LLE pain, sustained L comminuted proximal tibia/tibial plateau fracture. s/p ORIF L tibial plateau fx on 11/2. PMH includes endocarditis with resultant mild AS, breast cancer (2015), MS, IBS, ACDF 2013, and seizures.    PT Comments    Pt admitted with above diagnosis. Trialed sliding board transfer with pt having difficulty assisting with UEs as she cant get good pushing with UEs as well as right LE extends therefore she cant use it functionally as well.   Pt very limited by multiple deficits.  Pt mentioned to PT and OT that she has a brace for the right LE therefore PT/OT asked pt to bring it in and we can see if it would be helpful for her to use her right LE more effectively.  Bil LE muscles weak limiting pt a great deal. Pt currently with functional limitations due to balance and endurance deficits. Pt will benefit from skilled PT to increase their independence and safety with mobility to allow discharge to the venue listed below.      Recommendations for follow up therapy are one component of a multi-disciplinary discharge planning process, led by the attending physician.  Recommendations may be updated based on patient status, additional functional criteria and insurance authorization.  Follow Up Recommendations  Skilled nursing-short term rehab (<3 hours/day)     Assistance Recommended at Discharge Frequent or constant Supervision/Assistance  Equipment Recommendations  Wheelchair cushion (measurements PT);Wheelchair (measurements PT)    Recommendations for Other Services       Precautions / Restrictions Precautions Precautions: Fall Precaution Comments: impaired R LE tone; pt reports she has a brace that she has used on R LE - asked pt to have family bring brace to  try Restrictions Weight Bearing Restrictions: Yes LLE Weight Bearing: Touchdown weight bearing Other Position/Activity Restrictions: RLE has extension tone with efforts to stand     Mobility  Bed Mobility Overal bed mobility: Needs Assistance Bed Mobility: Supine to Sit     Supine to sit: Max assist;+2 for safety/equipment;HOB elevated     General bed mobility comments: Max A for LE negotiation to EOB with pt able to assist in lifting trunk via bedrails    Transfers Overall transfer level: Needs assistance Equipment used: Sliding board Transfers: Lateral/Scoot Transfers            Lateral/Scoot Transfers: Total assist;+2 physical assistance;+2 safety/equipment;With slide board General transfer comment: Trialed sliding board transfer from bed to drop arm recliner with pt requiring assist to block R foot from sliding, assist to scoot hips with sliding board sliding with pt. Pt with difficulty initiating movement to assist with task, requiring Total A x 2 to slide to chair, Pt unable to assist in scooting bottom back in chair after transfer. Total A x 3 (NT assisting) for scooting up in recliner chair once laid flat    Ambulation/Gait             General Gait Details: unable   Stairs             Wheelchair Mobility    Modified Rankin (Stroke Patients Only)       Balance Overall balance assessment: Needs assistance;History of Falls Sitting-balance support: Feet supported;Single extremity supported Sitting balance-Leahy Scale: Poor Sitting balance - Comments: reliant on L UE support on bedrail to maintain balance.  difficulty sitting unsupported without UE support.  Leans to her left at times.  Needed constant min to min gaurd assist to sit EOB 15 min Postural control: Posterior lean;Left lateral lean                                  Cognition Arousal/Alertness: Awake/alert Behavior During Therapy: WFL for tasks assessed/performed Overall  Cognitive Status: Within Functional Limits for tasks assessed                                          Exercises General Exercises - Lower Extremity Ankle Circles/Pumps: AROM;Both;5 reps;Supine Quad Sets: AROM;Both;5 reps;Supine Gluteal Sets: AROM;Both;5 reps;Supine Heel Slides: AAROM;Both;5 reps;Supine    General Comments        Pertinent Vitals/Pain Pain Assessment: Faces Faces Pain Scale: Hurts little more Pain Location: LLE Pain Descriptors / Indicators: Discomfort;Guarding Pain Intervention(s): Limited activity within patient's tolerance;Monitored during session;Repositioned;Premedicated before session    Home Living Family/patient expects to be discharged to:: Private residence Living Arrangements: Spouse/significant other Available Help at Discharge: Family;Personal care attendant (close to 24/7) Type of Home: House Home Access: Ramped entrance       Home Layout: One level Home Equipment: Conservation officer, nature (2 wheels);BSC;Transport chair;Other (comment) (lift chair)      Prior Function            PT Goals (current goals can now be found in the care plan section) Acute Rehab PT Goals Patient Stated Goal: return to PLOF Progress towards PT goals: Progressing toward goals    Frequency    Min 3X/week      PT Plan Current plan remains appropriate    Co-evaluation PT/OT/SLP Co-Evaluation/Treatment: Yes Reason for Co-Treatment: Complexity of the patient's impairments (multi-system involvement);For patient/therapist safety PT goals addressed during session: Mobility/safety with mobility OT goals addressed during session: ADL's and self-care      AM-PAC PT "6 Clicks" Mobility   Outcome Measure  Help needed turning from your back to your side while in a flat bed without using bedrails?: A Lot Help needed moving from lying on your back to sitting on the side of a flat bed without using bedrails?: A Lot Help needed moving to and from a bed to  a chair (including a wheelchair)?: Total Help needed standing up from a chair using your arms (e.g., wheelchair or bedside chair)?: Total Help needed to walk in hospital room?: Total Help needed climbing 3-5 steps with a railing? : Total 6 Click Score: 8    End of Session Equipment Utilized During Treatment: Gait belt Activity Tolerance: Patient tolerated treatment well;Patient limited by pain Patient left: in chair;with call bell/phone within reach;with chair alarm set Nurse Communication: Mobility status;Need for lift equipment (placed Maximove pad under pt and talked with nurse) PT Visit Diagnosis: Other abnormalities of gait and mobility (R26.89);Muscle weakness (generalized) (M62.81);Pain Pain - Right/Left: Left Pain - part of body: Leg     Time: 2426-8341 PT Time Calculation (min) (ACUTE ONLY): 28 min  Charges:  $Therapeutic Activity: 8-22 mins                     Rebecca [name],PT Acute Rehab Services [phone] (pager)    Rebecca [name] 12/11/2020, 2:41 PM

## 2020-12-11 NOTE — Evaluation (Signed)
Occupational Therapy Evaluation Patient Details Name: Rebecca [name] MRN: [id number] DOB: [birth year] Today's Date: 12/11/2020   History of Present Illness 60 yo female presents to [facility] on 10/31 with fall and LLE pain, sustained L comminuted proximal tibia/tibial plateau fracture. s/p ORIF L tibial plateau fx on 11/2. PMH includes endocarditis with resultant mild AS, breast cancer (2015), MS, IBS, ACDF 2013, and seizures.   Clinical Impression   PTA, pt lives with family and reports daily aide assist with bathing/dressing tasks at bed level (bathroom not DME accessible), assistance for short mobility using RW, and use of BSC for toileting tasks. Pt presents now with significant deficits in strength, balance and endurance. Pt limited by L LE WB precautions and baseline R LE tone impairments. Pt requires Max A x for bed mobility, Total A x 2 for sliding board to drop arm recliner and Total A x 3 to scoot up in recliner. Pt requires Min A for UB ADLs and up to Total A x 2 for LB ADLs bed level at this time. As pt is below typical functional baseline and at increased risk for future falls, recommend SNF rehab to increase independence and reduce caregiver burden. Plan to assess for improvements in R LE for OOB attempts once family brings R LE brace that pt occasionally wears for mobility.       Recommendations for follow up therapy are one component of a multi-disciplinary discharge planning process, led by the attending physician.  Recommendations may be updated based on patient status, additional functional criteria and insurance authorization.   Follow Up Recommendations  Skilled nursing-short term rehab (<3 hours/day)    Assistance Recommended at Discharge Frequent or constant Supervision/Assistance  Functional Status Assessment  Patient has had a recent decline in their functional status and demonstrates the ability to make significant improvements in function in a reasonable and predictable amount  of time.  Equipment Recommendations  Other (comment) (hoyer lift)    Recommendations for Other Services       Precautions / Restrictions Precautions Precautions: Fall Precaution Comments: impaired R LE tone; pt reports she has a brace that she has used on R LE - asked pt to have family bring brace to try Restrictions Weight Bearing Restrictions: Yes LLE Weight Bearing: Touchdown weight bearing      Mobility Bed Mobility Overal bed mobility: Needs Assistance Bed Mobility: Supine to Sit     Supine to sit: Max assist;+2 for safety/equipment;HOB elevated     General bed mobility comments: Max A for LE negotiation to EOB with pt able to assist in lifting trunk via bedrails    Transfers Overall transfer level: Needs assistance Equipment used: Sliding board Transfers: Lateral/Scoot Transfers            Lateral/Scoot Transfers: Total assist;+2 physical assistance;+2 safety/equipment;With slide board General transfer comment: Trialed sliding board transfer from bed to drop arm recliner with pt requiring assist to block R foot from sliding, assist to scoot hips with sliding board sliding with pt. Pt with difficulty initiating movement to assist with task, requiring Total A x 2 to slide to chair, Pt unable to assist in scooting bottom back in chair after transfer. Total A x 3 (NT assisting) for scooting up in recliner chair once laid flat      Balance Overall balance assessment: Needs assistance;History of Falls Sitting-balance support: Feet supported;Single extremity supported Sitting balance-Leahy Scale: Poor Sitting balance - Comments: reliant on L UE support on bedrail to maintain balance. difficulty sitting unsupport  without UE support                                   ADL either performed or assessed with clinical judgement   ADL Overall ADL's : Needs assistance/impaired Eating/Feeding: Independent;Sitting   Grooming: Set up;Sitting   Upper Body  Bathing: Minimal assistance;Sitting   Lower Body Bathing: Total assistance;Bed level;+2 for physical assistance;+2 for safety/equipment   Upper Body Dressing : Minimal assistance;Sitting   Lower Body Dressing: Total assistance;Bed level;+2 for physical assistance;+2 for safety/equipment       Toileting- Clothing Manipulation and Hygiene: Total assistance;+2 for physical assistance;+2 for safety/equipment;Bed level         General ADL Comments: Pt impaired by new fx/WB precautions for dominant LE as R LE with impaired tone. Pt requires increased assist of 2-3 persons for functional transfers and LB ADLs. Unable to safely stand with RW     Vision Baseline Vision/History: 1 Wears glasses Ability to See in Adequate Light: 0 Adequate Patient Visual Report: No change from baseline Vision Assessment?: No apparent visual deficits     Perception     Praxis      Pertinent Vitals/Pain Pain Assessment: Faces Faces Pain Scale: Hurts a little bit Pain Location: LLE Pain Descriptors / Indicators: Discomfort;Guarding Pain Intervention(s): Monitored during session;Limited activity within patient's tolerance;Repositioned     Hand Dominance Right   Extremity/Trunk Assessment Upper Extremity Assessment Upper Extremity Assessment: Generalized weakness   Lower Extremity Assessment Lower Extremity Assessment: Defer to PT evaluation   Cervical / Trunk Assessment Cervical / Trunk Assessment: Normal   Communication Communication Communication: No difficulties   Cognition Arousal/Alertness: Awake/alert Behavior During Therapy: WFL for tasks assessed/performed Overall Cognitive Status: Within Functional Limits for tasks assessed                                       General Comments       Exercises     Shoulder Instructions      Home Living Family/patient expects to be discharged to:: Private residence Living Arrangements: Spouse/significant other Available Help  at Discharge: Family;Personal care attendant (close to 24/7) Type of Home: House Home Access: Ramped entrance     Home Layout: One level           Bathroom Accessibility: No   Home Equipment: Conservation officer, nature (2 wheels);BSC;Transport chair;Other (comment) (lift chair)          Prior Functioning/Environment Prior Level of Function : Needs assist;History of Falls (last six months)       Physical Assist : Mobility (physical);ADLs (physical) Mobility (physical): Transfers;Bed mobility;Gait ADLs (physical): Grooming;Bathing;Dressing;Toileting;IADLs Mobility Comments: Pt reports she always has someone (husband, aide, RN) to assist with transfers, short-distance gait with RW (to kitchen, to bedroom, pivots to [facility]). Aide will provide chair follow as needed ADLs Comments: [place] aide 3 hours/day, 7 days a week. Also has an Therapist, sports who changes foley catheter every 20 days. Due to RW unable to fit in bathroom, aide assists pt with sponge bathing and dressing tasks in bed (pt able to walk to bedroom for these tasks). use of BSC at home. walks to kitchen with chair follow from aide for washing hair. Husband completes IADLs in home but pt able to manage own medicationsq        OT Problem List: Decreased strength;Decreased activity  tolerance;Impaired balance (sitting and/or standing);Decreased knowledge of precautions;Impaired tone;Increased edema      OT Treatment/Interventions: Self-care/ADL training;Therapeutic exercise;Energy conservation;DME and/or AE instruction;Therapeutic activities;Patient/family education;Balance training    OT Goals(Current goals can be found in the care plan section) Acute Rehab OT Goals Patient Stated Goal: be able to get back to baseline OT Goal Formulation: With patient Time For Goal Achievement: 12/25/20 Potential to Achieve Goals: Fair  OT Frequency: Min 2X/week   Barriers to D/C:            Co-evaluation PT/OT/SLP Co-Evaluation/Treatment: Yes Reason for  Co-Treatment: For patient/therapist safety;To address functional/ADL transfers;Complexity of the patient's impairments (multi-system involvement)   OT goals addressed during session: ADL's and self-care      AM-PAC OT "6 Clicks" Daily Activity     Outcome Measure Help from another person eating meals?: None Help from another person taking care of personal grooming?: A Little Help from another person toileting, which includes using toliet, bedpan, or urinal?: Total Help from another person bathing (including washing, rinsing, drying)?: Total Help from another person to put on and taking off regular upper body clothing?: A Little Help from another person to put on and taking off regular lower body clothing?: Total 6 Click Score: 13   End of Session Equipment Utilized During Treatment: Gait belt;Other (comment) (sliding board) Nurse Communication: Mobility status;Other (comment) (catheter question)  Activity Tolerance: Patient tolerated treatment well Patient left: in chair;with call bell/phone within reach;with chair alarm set  OT Visit Diagnosis: Unsteadiness on feet (R26.81);Other abnormalities of gait and mobility (R26.89);Muscle weakness (generalized) (M62.81)                Time: 1155-2080 OT Time Calculation (min): 36 min Charges:  OT General Charges $OT Visit: 1 Visit OT Evaluation $OT Eval Moderate Complexity: 1 Mod  Malachy Chamber, OTR/L Acute Rehab Services Office: [phone]   Layla Maw 12/11/2020, 1:23 PM

## 2020-12-11 NOTE — Progress Notes (Addendum)
PROGRESS NOTE  Rebecca [name]  DOB: 1960-09-25  PCP: Delsa Grana, PA-C PNT:[id number]  DOA: 12/08/2020  LOS: 3 days  Hospital Day: 4  Chief complaint: Fall   Brief narrative: Rebecca [name] is a 60 y.o. 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 On Saturday 10/30, patient got up from her potty chair and attempted to move to the lift chair with a walker.  In the process, her left leg got stuck on the carpet because it twisted awkwardly and felt a snap on her leg.  EMS came and lifted her back into the lift chair versus declined across port to ED at that time.  She was unable to get back up since then and hence he called EMS again 11/31.  She could not stand at all and hence agreed to be brought to the ED at [facility].  Imagings of left knee showed severely displaced and comminuted proximal left tibial fracture which appears to extend to the articular surface of the lateral tibial plateau.  Also noted to have moderately displaced and comminuted proximal left fibula fracture.   Because of the complexity of her fracture, patient was transferred to [facility] Admitted to hospitalist service. On 11/2, patient underwent ORIF of left tibial plateau fracture by Dr. Doreatha Martin   Subjective: Patient was seen and examined this morning.  Lying on bed.  Not in distress.  No new symptoms.  On exam today, I noticed that she has acute redness, pain and swelling of her left leg.  She states that yesterday on removing the linens, she noted some soakage as well.  Labs this morning with liver enzymes improving.  Assessment/Plan: Left tibia/left fibula fracture -Sustained fracture after mechanical fall. -11/2, underwent ORIF of left  tibial fracture. -DVT prophylaxis and pain per orthopedics team. -PT eval obtained.  SNF recommended.  Left leg cellulitis Chronic lymphedema of bilateral lower extremities -This morning, he noted that he has acute redness, tenderness and increased swelling of left leg.  She states that yesterday while removing the linens, she was noted to have some oozing from there.   -High risk of progression.  I will start the patient on IV Rocephin and get a wound care consult. Recent Labs  Lab 12/07/20 1859 12/09/20 0228 12/10/20 0501 12/11/20 0332  WBC 6.0 4.7 6.2 5.8   Hepatic steatosis  -Elevated liver enzymes noted on 11/3.  Right upper quadrant ultrasound showed diffuse hepatic steatosis.  Hepatitis panel negative.  Liver enzymes trending down.  Lipid panel sent.  Recent Labs  Lab 12/08/20 1222 12/10/20 0501 12/11/20 0332  AST 156* 175* 70*  ALT 108* 235* 129*  ALKPHOS 131* 161* 141*  BILITOT 1.1 0.7 0.9  PROT 6.8 5.7* 5.9*  ALBUMIN 3.4* 2.8* 2.8*      Component Value Date/Time   CHOL 197 01/18/2019 0000   TRIG 114 01/18/2019 0000   HDL 62 01/18/2019 0000   CHOLHDL 3.2 01/18/2019 0000   VLDL 22 11/25/2015 1216   LDLCALC 113 (H) 01/18/2019 0000   Acute blood loss anemia -Hemoglobin seems to be normal at baseline.  Postoperatively, hemoglobin has been steadily dropping however stable last 24 hours, 7.3 today.  No active evidence of bleeding anywhere else. -Avoiding heparin  products.  Currently on SCD for DVT prophylaxis. Recent Labs    04/14/20 1235 12/07/20 1859 12/09/20 0228 12/10/20 0501 12/11/20 0332  HGB 12.8 10.5* 8.2* 7.4* 7.3*  MCV 90.5 90.4 90.5 89.8 89.7   Multiple sclerosis -She is minimally ambulatory at baseline -Continue amantadine, Baclofen, Zanaflex -She is on Rebif 3x/week   Seizure d/o -Continue Keppra   Neurogenic bladder -Has an indwelling foley -Continue Ditropan.  Not on chronic antibiotics.   Obesity -Body mass index is 36.73 kg/m.  Patient has been advised to make an attempt to improve diet and exercise patterns to aid in weight loss.   Mobility: Encourage ambulation.  PT eval appreciated Living condition: Lives at home Goals of care:   Code Status: Full Code  Nutritional status: Body mass index is 36.73 kg/m. Nutrition Problem: Increased nutrient needs Etiology: post-op healing Signs/Symptoms: estimated needs Diet:  Diet Order             Diet Heart Room service appropriate? Yes; Fluid consistency: Thin  Diet effective now                  DVT prophylaxis:  enoxaparin (LOVENOX) injection 40 mg Start: 12/11/20 1000 SCDs Start: 12/09/20 1120 SCDs Start: 12/08/20 1809   Antimicrobials:.  IV Rocephin for cellulitis Fluid: Not on IV fluid Consultants: Orthopedics Family Communication: None at bedside  Status is: Inpatient  Remains inpatient appropriate because: On IV antibiotics for cellulitis.  Placement in progress  Dispo: The patient is from: Home              Anticipated d/c is to: SNF per PT              Patient currently is not medically stable to d/c.   Difficult to place patient No     Infusions:   cefTRIAXone (ROCEPHIN)  IV       Scheduled Meds:  amantadine  100 mg Oral TID   baclofen  10 mg Oral Q lunch   baclofen  20 mg Oral BID AC & HS   Chlorhexidine Gluconate Cloth  6 each Topical Q0600   cholecalciferol  1,000 Units Oral Daily   docusate sodium  100 mg Oral BID   enoxaparin (LOVENOX) injection  40 mg Subcutaneous Q24H   feeding supplement  237 mL Oral Q24H   levETIRAcetam  500 mg Oral BID   lipase/protease/amylase  36,000 Units Oral TID WC   loratadine  10 mg Oral Daily   multivitamin with minerals  1 tablet Oral Daily   mupirocin ointment  1 application Nasal BID   oxybutynin  10 mg Oral BID   pantoprazole  40 mg Oral Daily   polyethylene glycol  17 g Oral Daily   tiZANidine  4 mg Oral Q0200    PRN meds: acetaminophen, bisacodyl, HYDROcodone-acetaminophen,  HYDROcodone-acetaminophen, metoCLOPramide **OR** metoCLOPramide (REGLAN) injection, morphine injection, ondansetron **OR** ondansetron (ZOFRAN) IV, polyethylene glycol, sodium chloride flush   Antimicrobials: Anti-infectives (From admission, onward)    Start     Dose/Rate Route Frequency Ordered Stop   12/11/20 1015  cefTRIAXone (ROCEPHIN) 1 g in sodium chloride 0.9 % 100 mL IVPB        1 g 200 mL/hr over 30 Minutes Intravenous Every 24 hours 12/11/20 0922 12/18/20 1014   12/09/20 1700  ceFAZolin (ANCEF) IVPB 2g/100 mL premix        2 g 200 mL/hr over 30 Minutes Intravenous Every 8 hours 12/09/20 1120 12/10/20 0904   12/09/20 0942  vancomycin (VANCOCIN) powder  Status:  Discontinued          As needed 12/09/20 0942 12/09/20 1004       Objective: Vitals:   12/10/20 2008 12/11/20 0808  BP: (!) 160/71 116/61  Pulse: (!) 101 92  Resp: 16 17  Temp: 98.8 F (37.1 C) 99 F (37.2 C)  SpO2: 96% 96%    Intake/Output Summary (Last 24 hours) at 12/11/2020 0927 Last data filed at 12/11/2020 0556 Gross per 24 hour  Intake 360 ml  Output 2100 ml  Net -1740 ml   Filed Weights   12/08/20 1628 12/09/20 0747  Weight: 97.1 kg 97.1 kg   Weight change:  Body mass index is 36.73 kg/m.   Physical Exam: General exam: Pleasant, middle-aged Caucasian female.  Not in physical distress Skin: No rashes, lesions or ulcers. HEENT: Atraumatic, normocephalic, no obvious bleeding Lungs: Clear to auscultation bilaterally CVS: Regular rate and rhythm, mild ejection systolic murmur GI/Abd soft, nontender, nondistended, bowel sound present CNS: Alert, awake, oriented x3 Psychiatry: Mood appropriate Extremities: Chronic lymphedema of both lower extremities.  Left leg red, tender and more swollen.  Data Review: I have personally reviewed the laboratory data and studies available.  F/u labs ordered Unresulted Labs (From admission, onward)     Start     Ordered   12/16/20 0500  Creatinine, serum   (enoxaparin (LOVENOX)    CrCl >/= 30 ml/min)  Weekly,   R     Comments: while on enoxaparin therapy    12/09/20 1120   12/11/20 0757  Lipid panel  Add-on,   AD       Question:  Specimen collection method  Answer:  IV Team=IV Team collect   12/11/20 0756   12/10/20 0500  CBC with Differential/Platelet  Daily,   R      12/09/20 0849   12/10/20 0500  Comprehensive metabolic panel  Daily,   R      12/09/20 0849   12/10/20 6606  Basic metabolic panel  Daily,   R      12/09/20 1120   12/10/20 0500  CBC  Daily,   R      12/09/20 1120            Signed, Terrilee Croak, MD [facility] 12/11/2020

## 2020-12-12 ENCOUNTER — Inpatient Hospital Stay (HOSPITAL_COMMUNITY): Payer: Medicare Other

## 2020-12-12 DIAGNOSIS — S82402A Unspecified fracture of shaft of left fibula, initial encounter for closed fracture: Secondary | ICD-10-CM | POA: Diagnosis not present

## 2020-12-12 DIAGNOSIS — S82202A Unspecified fracture of shaft of left tibia, initial encounter for closed fracture: Secondary | ICD-10-CM | POA: Diagnosis not present

## 2020-12-12 DIAGNOSIS — L0390 Cellulitis, unspecified: Secondary | ICD-10-CM

## 2020-12-12 DIAGNOSIS — Z9889 Other specified postprocedural states: Secondary | ICD-10-CM

## 2020-12-12 DIAGNOSIS — R609 Edema, unspecified: Secondary | ICD-10-CM

## 2020-12-12 LAB — CBC WITH DIFFERENTIAL/PLATELET
Abs Immature Granulocytes: 0.04 10*3/uL (ref 0.00–0.07)
Basophils Absolute: 0 10*3/uL (ref 0.0–0.1)
Basophils Relative: 0 %
Eosinophils Absolute: 0 10*3/uL (ref 0.0–0.5)
Eosinophils Relative: 1 %
HCT: 23.3 % — ABNORMAL LOW (ref 36.0–46.0)
Hemoglobin: 7.8 g/dL — ABNORMAL LOW (ref 12.0–15.0)
Immature Granulocytes: 1 %
Lymphocytes Relative: 12 %
Lymphs Abs: 0.7 10*3/uL (ref 0.7–4.0)
MCH: 30 pg (ref 26.0–34.0)
MCHC: 33.5 g/dL (ref 30.0–36.0)
MCV: 89.6 fL (ref 80.0–100.0)
Monocytes Absolute: 0.6 10*3/uL (ref 0.1–1.0)
Monocytes Relative: 11 %
Neutro Abs: 4.2 10*3/uL (ref 1.7–7.7)
Neutrophils Relative %: 75 %
Platelets: 150 10*3/uL (ref 150–400)
RBC: 2.6 MIL/uL — ABNORMAL LOW (ref 3.87–5.11)
RDW: 12.9 % (ref 11.5–15.5)
WBC: 5.6 10*3/uL (ref 4.0–10.5)
nRBC: 0.4 % — ABNORMAL HIGH (ref 0.0–0.2)

## 2020-12-12 LAB — COMPREHENSIVE METABOLIC PANEL
ALT: 142 U/L — ABNORMAL HIGH (ref 0–44)
AST: 85 U/L — ABNORMAL HIGH (ref 15–41)
Albumin: 2.7 g/dL — ABNORMAL LOW (ref 3.5–5.0)
Alkaline Phosphatase: 167 U/L — ABNORMAL HIGH (ref 38–126)
Anion gap: 7 (ref 5–15)
BUN: 8 mg/dL (ref 6–20)
CO2: 27 mmol/L (ref 22–32)
Calcium: 8.6 mg/dL — ABNORMAL LOW (ref 8.9–10.3)
Chloride: 99 mmol/L (ref 98–111)
Creatinine, Ser: 0.47 mg/dL (ref 0.44–1.00)
GFR, Estimated: >60 mL/min (ref >60)
Glucose, Bld: 131 mg/dL — ABNORMAL HIGH (ref 70–99)
Potassium: 3.7 mmol/L (ref 3.5–5.1)
Sodium: 133 mmol/L — ABNORMAL LOW (ref 135–145)
Total Bilirubin: 1.1 mg/dL (ref 0.3–1.2)
Total Protein: 6.1 g/dL — ABNORMAL LOW (ref 6.5–8.1)

## 2020-12-12 MED ORDER — INTERFERON BETA-1A 44 MCG/0.5ML ~~LOC~~ SOSY
44.0000 ug | PREFILLED_SYRINGE | Freq: Once | SUBCUTANEOUS | Status: AC
  Administered 2020-12-12: 44 ug via SUBCUTANEOUS
  Filled 2020-12-12 (×3): qty 0.5

## 2020-12-12 MED ORDER — INTERFERON BETA-1A 44 MCG/0.5ML ~~LOC~~ SOAJ: 44.0000 ug | SUBCUTANEOUS | Status: DC

## 2020-12-12 MED ORDER — INTERFERON BETA-1A 44 MCG/0.5ML ~~LOC~~ SOSY
44.0000 ug | PREFILLED_SYRINGE | SUBCUTANEOUS | Status: DC
Start: 2020-12-14 — End: 2020-12-18
  Administered 2020-12-14 – 2020-12-18 (×3): 44 ug via SUBCUTANEOUS
  Filled 2020-12-12 (×3): qty 0.5

## 2020-12-12 NOTE — Plan of Care (Signed)

## 2020-12-12 NOTE — Progress Notes (Signed)
Patient has chronic foley catheter in place.  Catheter was leaking. Nurse changed catheter to new one.    This morning foley catheter is leaking again.  Foley was still in place used 16 mL of normal saline to stabilize catheter.  Catheter is more sturdy and is currently not leaking.

## 2020-12-12 NOTE — Progress Notes (Signed)
VASCULAR LAB    Left lower extremity venous duplex has been performed.  See CV proc for preliminary results.   Magdaleno Lortie, RVT 12/12/2020, 3:06 PM

## 2020-12-12 NOTE — Progress Notes (Signed)
PROGRESS NOTE  Rebecca [name]  DOB: 01-Jan-1961  PCP: Delsa Grana, PA-C UJW:[id number]  DOA: 12/08/2020  LOS: 4 days  Hospital Day: 5  Chief complaint: Fall   Brief narrative: Rebecca [name] is a 60 y.o. 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 On Saturday 10/30, patient got up from her potty chair and attempted to move to the lift chair with a walker.  In the process, her left leg got stuck on the carpet because it twisted awkwardly and felt a snap on her leg.  EMS came and lifted her back into the lift chair versus declined across port to ED at that time.  She was unable to get back up since then and hence he called EMS again 11/31.  She could not stand at all and hence agreed to be brought to the ED at [facility].  Imagings of left knee showed severely displaced and comminuted proximal left tibial fracture which appears to extend to the articular surface of the lateral tibial plateau.  Also noted to have moderately displaced and comminuted proximal left fibula fracture.   Because of the complexity of her fracture, patient was transferred to [facility] Admitted to hospitalist service. On 11/2, patient underwent ORIF of left tibial plateau fracture by Dr. Doreatha Martin   Subjective: Patient was seen and examined this morning.  Propped up in bed.  Not in distress.  No new symptoms.  Left leg cellulitis seems worse to me today.  Assessment/Plan: Left tibia/left fibula fracture -Sustained fracture after mechanical fall. -11/2, underwent ORIF of left tibial fracture. -DVT prophylaxis and pain per orthopedics team. -PT eval obtained.  SNF recommended.  Left leg cellulitis Chronic lymphedema of bilateral lower  extremities -On 11/4, she was noted to have acute cellulitis of left leg with redness, tenderness and increased swelling.  Wound care consult was obtained.  Patient is currently on IV Rocephin.  It looks worse to me today.  However she does not have any fever.  WBC count remains stable. -Obtain DVT scan Recent Labs  Lab 12/07/20 1859 12/09/20 0228 12/10/20 0501 12/11/20 0332 12/12/20 0500  WBC 6.0 4.7 6.2 5.8 5.6   Hepatic steatosis  -Elevated liver enzymes noted on 11/3.  Right upper quadrant ultrasound showed diffuse hepatic steatosis.  Hepatitis panel negative.  Liver enzymes overall improving.  Continue to monitor -Lipid panel 11/4 with LDL at 77, HDL at 48. Recent Labs  Lab 12/08/20 1222 12/10/20 0501 12/11/20 0332 12/12/20 0500  AST 156* 175* 70* 85*  ALT 108* 235* 129* 142*  ALKPHOS 131* 161* 141* 167*  BILITOT 1.1 0.7 0.9 1.1  PROT 6.8 5.7* 5.9* 6.1*  ALBUMIN 3.4* 2.8* 2.8* 2.7*      Component Value Date/Time   CHOL 138 12/11/2020 1505   TRIG 63 12/11/2020 1505   HDL 48 12/11/2020 1505   CHOLHDL 2.9 12/11/2020 1505   VLDL 13 12/11/2020 1505   LDLCALC 77 12/11/2020 1505   LDLCALC 113 (H) 01/18/2019 0000   Acute blood loss anemia -Hemoglobin seems to be normal at baseline.  Postoperatively, hemoglobin has been running low and stable between 7 and 8.  No active evidence of bleeding anywhere else. -Avoiding heparin products.  Currently on SCD for DVT  prophylaxis. Recent Labs    12/07/20 1859 12/09/20 0228 12/10/20 0501 12/11/20 0332 12/12/20 0500  HGB 10.5* 8.2* 7.4* 7.3* 7.8*  MCV 90.4 90.5 89.8 89.7 89.6   Multiple sclerosis -She is minimally ambulatory at baseline -Continue amantadine, Baclofen, Zanaflex -She is on Rebif 3x/week.  Reordered today.   Seizure d/o -Continue Keppra   Neurogenic bladder -Has an indwelling foley -Continue Ditropan.  Not on chronic antibiotics.   Obesity -Body mass index is 36.73 kg/m. Patient has been advised to make  an attempt to improve diet and exercise patterns to aid in weight loss.   Mobility: Encourage ambulation.  PT eval appreciated Living condition: Lives at home Goals of care:   Code Status: Full Code  Nutritional status: Body mass index is 36.73 kg/m. Nutrition Problem: Increased nutrient needs Etiology: post-op healing Signs/Symptoms: estimated needs Diet:  Diet Order             Diet Heart Room service appropriate? Yes; Fluid consistency: Thin  Diet effective now                  DVT prophylaxis:  enoxaparin (LOVENOX) injection 40 mg Start: 12/11/20 1000 SCDs Start: 12/09/20 1120 SCDs Start: 12/08/20 1809   Antimicrobials:.  IV Rocephin for cellulitis Fluid: Not on IV fluid Consultants: Orthopedics Family Communication: None at bedside  Status is: Inpatient  Remains inpatient appropriate because: On IV antibiotics for cellulitis.  Placement in progress  Dispo: The patient is from: Home              Anticipated d/c is to: SNF per PT              Patient currently is not medically stable to d/c.   Difficult to place patient No     Infusions:   cefTRIAXone (ROCEPHIN)  IV 1 g (12/12/20 0912)     Scheduled Meds:  amantadine  100 mg Oral TID   baclofen  10 mg Oral Q lunch   baclofen  20 mg Oral BID AC & HS   Chlorhexidine Gluconate Cloth  6 each Topical Q0600   cholecalciferol  1,000 Units Oral Daily   docusate sodium  100 mg Oral BID   enoxaparin (LOVENOX) injection  40 mg Subcutaneous Q24H   feeding supplement  237 mL Oral Q24H   Interferon Beta-1a  44 mcg Subcutaneous Once per day on Mon Wed Fri   levETIRAcetam  500 mg Oral BID   lipase/protease/amylase  36,000 Units Oral TID WC   loratadine  10 mg Oral Daily   multivitamin with minerals  1 tablet Oral Daily   mupirocin ointment  1 application Nasal BID   oxybutynin  10 mg Oral BID   pantoprazole  40 mg Oral Daily   polyethylene glycol  17 g Oral Daily   tiZANidine  4 mg Oral Q0200    PRN  meds: acetaminophen, bisacodyl, HYDROcodone-acetaminophen, HYDROcodone-acetaminophen, metoCLOPramide **OR** metoCLOPramide (REGLAN) injection, morphine injection, ondansetron **OR** ondansetron (ZOFRAN) IV, polyethylene glycol, sodium chloride flush   Antimicrobials: Anti-infectives (From admission, onward)    Start     Dose/Rate Route Frequency Ordered Stop   12/11/20 1015  cefTRIAXone (ROCEPHIN) 1 g in sodium chloride 0.9 % 100 mL IVPB        1 g 200 mL/hr over 30 Minutes Intravenous Every 24 hours 12/11/20 0922 12/18/20 1014   12/09/20 1700  ceFAZolin (ANCEF) IVPB 2g/100 mL premix        2 g 200 mL/hr over 30 Minutes  Intravenous Every 8 hours 12/09/20 1120 12/10/20 0904   12/09/20 0942  vancomycin (VANCOCIN) powder  Status:  Discontinued          As needed 12/09/20 0942 12/09/20 1004       Objective: Vitals:   12/11/20 2313 12/12/20 0753  BP: (!) 168/86 134/69  Pulse:  89  Resp:  17  Temp: 100 F (37.8 C) 99.2 F (37.3 C)  SpO2:  93%    Intake/Output Summary (Last 24 hours) at 12/12/2020 1155 Last data filed at 12/11/2020 2100 Gross per 24 hour  Intake --  Output 900 ml  Net -900 ml   Filed Weights   12/08/20 1628 12/09/20 0747  Weight: 97.1 kg 97.1 kg   Weight change:  Body mass index is 36.73 kg/m.   Physical Exam: General exam: Pleasant, middle-aged Caucasian female.  Not in physical distress Skin: No rashes, lesions or ulcers. HEENT: Atraumatic, normocephalic, no obvious bleeding Lungs: Clear to auscultation bilaterally CVS: Regular rate and rhythm, mild ejection systolic murmur GI/Abd soft, nontender, nondistended, bowel sound present CNS: Alert, awake, oriented x3 Psychiatry: Mood appropriate Extremities: Chronic lymphedema of both lower extremities.  Left leg cellulitis seems worse to me.  Data Review: I have personally reviewed the laboratory data and studies available.  F/u labs ordered Unresulted Labs (From admission, onward)     Start      Ordered   12/16/20 0500  Creatinine, serum  (enoxaparin (LOVENOX)    CrCl >/= 30 ml/min)  Weekly,   R     Comments: while on enoxaparin therapy    12/09/20 1120   12/10/20 0500  CBC  Daily,   R      12/09/20 1120            Signed, Terrilee Croak, MD [facility] 12/12/2020

## 2020-12-13 DIAGNOSIS — S82202A Unspecified fracture of shaft of left tibia, initial encounter for closed fracture: Secondary | ICD-10-CM | POA: Diagnosis not present

## 2020-12-13 DIAGNOSIS — S82402A Unspecified fracture of shaft of left fibula, initial encounter for closed fracture: Secondary | ICD-10-CM | POA: Diagnosis not present

## 2020-12-13 NOTE — TOC Progression Note (Addendum)
Transition of Care [facility]) - Progression Note    Patient Details  Name: Rebecca [name] MRN: [id number] Date of Birth: [birth year]  Transition of Care [facility]) CM/SW [place], [place] Phone Number: 12/13/2020, 1:16 PM  Clinical Narrative:     Patient has SNF bed at [facility] when medically ready.CSW checked on status of insurance authorization for patient in [employer] Portal. Patient insurance authorization has been approved. Insurance authorization is approved from 11/4-11/8. Plan Auth ID# [id number] [place] ID# [id number].  CSW left voicemail for Perrin Smack to update her on status. CSW awaiting callback. CSW will continue to follow and assist with patients dc planning needs.  Expected Discharge Plan: Skilled Nursing Facility Barriers to Discharge: Continued Medical Work up  Expected Discharge Plan and Services Expected Discharge Plan: [place]                                               Social Determinants of Health (SDOH) Interventions    Readmission Risk Interventions No flowsheet data found.

## 2020-12-13 NOTE — Progress Notes (Signed)
PROGRESS NOTE  DERINDA BARTUS  DOB: 1960/08/15  PCP: Delsa Grana, PA-C QPY:[id number]  DOA: 12/08/2020  LOS: 5 days  Hospital Day: 6  Chief complaint: Fall   Brief narrative: Rebecca [name] is a 60 y.o. 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 On Saturday 10/30, patient got up from her potty chair and attempted to move to the lift chair with a walker.  In the process, her left leg got stuck on the carpet because it twisted awkwardly and felt a snap on her leg.  EMS came and lifted her back into the lift chair versus declined across port to ED at that time.  She was unable to get back up since then and hence he called EMS again 11/31.  She could not stand at all and hence agreed to be brought to the ED at [facility].  Imagings of left knee showed severely displaced and comminuted proximal left tibial fracture which appears to extend to the articular surface of the lateral tibial plateau.  Also noted to have moderately displaced and comminuted proximal left fibula fracture.   Because of the complexity of her fracture, patient was transferred to [facility] Admitted to hospitalist service. On 11/2, patient underwent ORIF of left tibial plateau fracture by Dr. Doreatha Martin   Subjective: Patient was seen and examined this morning.  Propped up in bed.  Not in distress.  No new symptoms.  Husband at bedside. Left leg cellulitis seems to be improving in terms of swelling, redness and pain  Assessment/Plan: Left tibia/left fibula fracture -Sustained fracture after mechanical fall. -11/2, underwent ORIF of left tibial fracture. -DVT prophylaxis and pain per orthopedics team. -PT eval obtained.  SNF recommended.  Left leg  cellulitis Chronic lymphedema of bilateral lower extremities -On 11/4, she was noted to have acute cellulitis of left leg with redness, tenderness and increased swelling.  Wound care consult was obtained.  Patient was started on IV Rocephin.  It seems to be clinically improving in terms of redness, swelling and pain.  No fever.  WBC count stable.  DVT scan negative.  Continue IV Rocephin for now. -If any suggestion of worsening, may need an MRI. Recent Labs  Lab 12/07/20 1859 12/09/20 0228 12/10/20 0501 12/11/20 0332 12/12/20 0500  WBC 6.0 4.7 6.2 5.8 5.6   Hepatic steatosis  -Elevated liver enzymes noted on 11/3.  Right upper quadrant ultrasound showed diffuse hepatic steatosis.  Hepatitis panel negative.  Liver enzymes improved at first but gradually worsening again without any clinical symptoms. -Lipid panel 11/4 with LDL at 77, HDL at 48. Recent Labs  Lab 12/08/20 1222 12/10/20 0501 12/11/20 0332 12/12/20 0500  AST 156* 175* 70* 85*  ALT 108* 235* 129* 142*  ALKPHOS 131* 161* 141* 167*  BILITOT 1.1 0.7 0.9 1.1  PROT 6.8 5.7* 5.9* 6.1*  ALBUMIN 3.4* 2.8* 2.8* 2.7*    Acute blood loss anemia -Hemoglobin seems to be normal at baseline.  Postoperatively, hemoglobin has been running low and stable between 7 and 8.  No active evidence of bleeding anywhere else. -Avoiding heparin products.  Currently on SCD for DVT prophylaxis. Recent Labs    12/07/20 1859 12/09/20 0228 12/10/20 0501 12/11/20 0332 12/12/20 0500  HGB 10.5*  8.2* 7.4* 7.3* 7.8*  MCV 90.4 90.5 89.8 89.7 89.6   Multiple sclerosis -She is minimally ambulatory at baseline -Continue amantadine, Baclofen, Zanaflex -She is on Rebif 3x/week.  Continue the same   Seizure d/o -Continue Keppra   Neurogenic bladder -Has an indwelling foley -Continue Ditropan.  Not on chronic antibiotics.   Obesity -Body mass index is 36.73 kg/m. Patient has been advised to make an attempt to improve diet and exercise patterns  to aid in weight loss.   Mobility: Encourage ambulation.  PT eval appreciated Living condition: Lives at home Goals of care:   Code Status: Full Code  Nutritional status: Body mass index is 36.73 kg/m. Nutrition Problem: Increased nutrient needs Etiology: post-op healing Signs/Symptoms: estimated needs Diet:  Diet Order             Diet Heart Room service appropriate? Yes; Fluid consistency: Thin  Diet effective now                  DVT prophylaxis:  enoxaparin (LOVENOX) injection 40 mg Start: 12/11/20 1000 SCDs Start: 12/09/20 1120 SCDs Start: 12/08/20 1809   Antimicrobials:.  IV Rocephin for cellulitis Fluid: Not on IV fluid Consultants: Orthopedics Family Communication: None at bedside  Status is: Inpatient  Remains inpatient appropriate because: On IV antibiotics for cellulitis.  Placement in progress  Dispo: The patient is from: Home              Anticipated d/c is to: SNF per PT in next 1 to 2 days.              Patient currently is not medically stable to d/c.   Difficult to place patient No     Infusions:   cefTRIAXone (ROCEPHIN)  IV 1 g (12/12/20 0912)     Scheduled Meds:  amantadine  100 mg Oral TID   baclofen  10 mg Oral Q lunch   baclofen  20 mg Oral BID AC & HS   Chlorhexidine Gluconate Cloth  6 each Topical Q0600   cholecalciferol  1,000 Units Oral Daily   docusate sodium  100 mg Oral BID   enoxaparin (LOVENOX) injection  40 mg Subcutaneous Q24H   feeding supplement  237 mL Oral Q24H   [START ON 12/14/2020] interferon beta-1a  44 mcg Subcutaneous Q M,W,F   levETIRAcetam  500 mg Oral BID   lipase/protease/amylase  36,000 Units Oral TID WC   loratadine  10 mg Oral Daily   multivitamin with minerals  1 tablet Oral Daily   mupirocin ointment  1 application Nasal BID   oxybutynin  10 mg Oral BID   pantoprazole  40 mg Oral Daily   polyethylene glycol  17 g Oral Daily   tiZANidine  4 mg Oral Q0200    PRN meds: acetaminophen, bisacodyl,  HYDROcodone-acetaminophen, HYDROcodone-acetaminophen, metoCLOPramide **OR** metoCLOPramide (REGLAN) injection, morphine injection, ondansetron **OR** ondansetron (ZOFRAN) IV, polyethylene glycol, sodium chloride flush   Antimicrobials: Anti-infectives (From admission, onward)    Start     Dose/Rate Route Frequency Ordered Stop   12/11/20 1015  cefTRIAXone (ROCEPHIN) 1 g in sodium chloride 0.9 % 100 mL IVPB        1 g 200 mL/hr over 30 Minutes Intravenous Every 24 hours 12/11/20 0922 12/18/20 1014   12/09/20 1700  ceFAZolin (ANCEF) IVPB 2g/100 mL premix        2 g 200 mL/hr over 30 Minutes Intravenous Every 8 hours 12/09/20 1120 12/10/20 0904   12/09/20 0942  vancomycin (  VANCOCIN) powder  Status:  Discontinued          As needed 12/09/20 0942 12/09/20 1004       Objective: Vitals:   12/13/20 0715 12/13/20 0748  BP:  123/72  Pulse:  89  Resp:  17  Temp:  98.6 F (37 C)  SpO2: 95% 90%    Intake/Output Summary (Last 24 hours) at 12/13/2020 1121 Last data filed at 12/13/2020 0750 Gross per 24 hour  Intake 200 ml  Output 1650 ml  Net -1450 ml   Filed Weights   12/08/20 1628 12/09/20 0747  Weight: 97.1 kg 97.1 kg   Weight change:  Body mass index is 36.73 kg/m.   Physical Exam: General exam: Pleasant, middle-aged Caucasian female.  Not in physical distress Skin: No rashes, lesions or ulcers. HEENT: Atraumatic, normocephalic, no obvious bleeding Lungs: Clear to auscultation bilaterally CVS: Regular rate and rhythm, mild ejection systolic murmur GI/Abd soft, nontender, nondistended, bowel sound present CNS: Alert, awake, oriented x3 Psychiatry: Mood appropriate Extremities: Chronic lymphedema of both lower extremities.  Left leg cellulitis seems to be improving  Data Review: I have personally reviewed the laboratory data and studies available.  F/u labs ordered Unresulted Labs (From admission, onward)     Start     Ordered   12/16/20 0500  Creatinine, serum   (enoxaparin (LOVENOX)    CrCl >/= 30 ml/min)  Weekly,   R     Comments: while on enoxaparin therapy    12/09/20 1120   12/10/20 0500  CBC  Daily,   R      12/09/20 1120            Signed, Terrilee Croak, MD [facility] 12/13/2020

## 2020-12-14 ENCOUNTER — Inpatient Hospital Stay (HOSPITAL_COMMUNITY): Payer: Medicare Other

## 2020-12-14 DIAGNOSIS — S82202A Unspecified fracture of shaft of left tibia, initial encounter for closed fracture: Secondary | ICD-10-CM | POA: Diagnosis not present

## 2020-12-14 DIAGNOSIS — S82402A Unspecified fracture of shaft of left fibula, initial encounter for closed fracture: Secondary | ICD-10-CM | POA: Diagnosis not present

## 2020-12-14 LAB — COMPREHENSIVE METABOLIC PANEL
ALT: 111 U/L — ABNORMAL HIGH (ref 0–44)
AST: 57 U/L — ABNORMAL HIGH (ref 15–41)
Albumin: 2.7 g/dL — ABNORMAL LOW (ref 3.5–5.0)
Alkaline Phosphatase: 187 U/L — ABNORMAL HIGH (ref 38–126)
Anion gap: 9 (ref 5–15)
BUN: 13 mg/dL (ref 6–20)
CO2: 28 mmol/L (ref 22–32)
Calcium: 8.4 mg/dL — ABNORMAL LOW (ref 8.9–10.3)
Chloride: 97 mmol/L — ABNORMAL LOW (ref 98–111)
Creatinine, Ser: 0.44 mg/dL (ref 0.44–1.00)
GFR, Estimated: >60 mL/min (ref >60)
Glucose, Bld: 133 mg/dL — ABNORMAL HIGH (ref 70–99)
Potassium: 3.7 mmol/L (ref 3.5–5.1)
Sodium: 134 mmol/L — ABNORMAL LOW (ref 135–145)
Total Bilirubin: 0.4 mg/dL (ref 0.3–1.2)
Total Protein: 6.3 g/dL — ABNORMAL LOW (ref 6.5–8.1)

## 2020-12-14 LAB — RESPIRATORY PANEL BY PCR

## 2020-12-14 LAB — CBC WITH DIFFERENTIAL/PLATELET
Abs Immature Granulocytes: 0.03 10*3/uL (ref 0.00–0.07)
Basophils Absolute: 0 10*3/uL (ref 0.0–0.1)
Basophils Relative: 0 %
Eosinophils Absolute: 0.3 10*3/uL (ref 0.0–0.5)
Eosinophils Relative: 7 %
HCT: 24 % — ABNORMAL LOW (ref 36.0–46.0)
Hemoglobin: 7.9 g/dL — ABNORMAL LOW (ref 12.0–15.0)
Immature Granulocytes: 1 %
Lymphocytes Relative: 22 %
Lymphs Abs: 0.9 10*3/uL (ref 0.7–4.0)
MCH: 29.8 pg (ref 26.0–34.0)
MCHC: 32.9 g/dL (ref 30.0–36.0)
MCV: 90.6 fL (ref 80.0–100.0)
Monocytes Absolute: 0.3 10*3/uL (ref 0.1–1.0)
Monocytes Relative: 8 %
Neutro Abs: 2.6 10*3/uL (ref 1.7–7.7)
Neutrophils Relative %: 62 %
Platelets: 198 10*3/uL (ref 150–400)
RBC: 2.65 MIL/uL — ABNORMAL LOW (ref 3.87–5.11)
RDW: 13.2 % (ref 11.5–15.5)
WBC: 4.1 10*3/uL (ref 4.0–10.5)
nRBC: 0 % (ref 0.0–0.2)

## 2020-12-14 LAB — RESP PANEL BY RT-PCR (FLU A&B, COVID) ARPGX2
Influenza A by PCR: NEGATIVE
Influenza B by PCR: NEGATIVE
SARS Coronavirus 2 by RT PCR: POSITIVE — AB

## 2020-12-14 MED ORDER — DOCUSATE SODIUM 100 MG PO CAPS
100.0000 mg | ORAL_CAPSULE | Freq: Two times a day (BID) | ORAL | 0 refills | Status: DC
Start: 2020-12-14 — End: 2021-08-17

## 2020-12-14 MED ORDER — ENSURE ENLIVE PO LIQD: 237.0000 mL | ORAL | 12 refills | Status: DC

## 2020-12-14 MED ORDER — BISACODYL 5 MG PO TBEC
5.0000 mg | DELAYED_RELEASE_TABLET | Freq: Every day | ORAL | 0 refills | Status: DC | PRN
Start: 2020-12-14 — End: 2021-08-17

## 2020-12-14 MED ORDER — CHLORHEXIDINE GLUCONATE CLOTH 2 % EX PADS
6.0000 | MEDICATED_PAD | Freq: Every day | CUTANEOUS | Status: DC
  Administered 2020-12-14 – 2020-12-18 (×5): 6 via TOPICAL

## 2020-12-14 MED ORDER — CEFDINIR 300 MG PO CAPS: 300.0000 mg | ORAL_CAPSULE | Freq: Two times a day (BID) | ORAL | 0 refills | Status: AC

## 2020-12-14 NOTE — TOC Progression Note (Addendum)
Transition of Care [facility]) - Initial/Assessment Note    Patient Details  Name: Rebecca [name] MRN: [id number] Date of Birth: [birth year]  [facility] CM/SW Contact:    Milinda Antis, [place] Phone Number: 12/14/2020, 12:09 PM  Clinical Narrative:                 CSW contacted [place] with [facility].  The facility can accept the patient when she is medically ready.  -Insurance authorization ends tomorrow.   15:30-  CSW received notification that the patient's Lab results were just received and attending will have d/c summary in by 1600.    16:20-   COVID test results have not been posted.  The facility requires that COVID test results be received prior to admission.   CSW contacted [place] with admissions at [facility] and provided an update on the d/c status.  Due to the above information and it being almost the end of business (1700).  The facility is unable to accept the patient on this day and can accept in the A.M.  Expected Discharge Plan: Skilled Nursing Facility Barriers to Discharge: Continued Medical Work up   Patient Goals and CMS Choice        Expected Discharge Plan and Services Expected Discharge Plan: [place]                                              Prior Living Arrangements/Services                       Activities of Daily Living Home Assistive Devices/Equipment: Bedside commode/3-in-1, Transport planner, Environmental consultant (specify type) ADL Screening (condition at time of admission) Patient's cognitive ability adequate to safely complete daily activities?: Yes Is the patient deaf or have difficulty hearing?: No Does the patient have difficulty seeing, even when wearing glasses/contacts?: No Does the patient have difficulty concentrating, remembering, or making decisions?: No Patient able to express need for assistance with ADLs?: Yes Does the patient have difficulty dressing or bathing?: No Independently performs ADLs?:  Yes (appropriate for developmental age) Does the patient have difficulty walking or climbing stairs?: Yes Weakness of Legs: Both Weakness of Arms/Hands: Both  Permission Sought/Granted                  Emotional Assessment              Admission diagnosis:  Fx up tibia w/ fibula-closed [S82.109A, S82.409A] Tibia/fibula fracture, left, closed, initial encounter [Y56.389H, S82.402A] Patient Active Problem List   Diagnosis Date Noted   Tibia/fibula fracture, left, closed, initial encounter 12/08/2020   Dysphagia    Acute esophagitis    Gastric erythema    Mucosal abnormality of duodenum    Schatzki's ring    Diarrhea 04/14/2020   Elevated BP without diagnosis of hypertension 04/14/2020   Urinary tract infection associated with indwelling urethral catheter ([place]) 02/17/2020   Muscle spasm 02/28/2019   Post-menopausal bleeding 11/20/2018   Ambulatory dysfunction 02/02/2018   Abnormality of gait 11/17/2017   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   Bilateral lower extremity pain (primary) (bilateral) (right greater than left) 08/01/2016   Chronic neck pain (secondary) (bilateral) ( left greater than right) 07/28/2016   Neutropenia ([place]) 06/27/2016   Chronic pain syndrome 06/03/2016   Neurogenic bladder    Neurogenic bowel  Detrusor and sphincter dyssynergia    Urinary incontinence    Seizure disorder ([place])    Slow transit constipation    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[facility])    Lymphedema    Encounter for screening for cervical cancer 11/27/2015   Preventative health care 11/25/2015   History of breast cancer in female 10/28/2015   Anemia 10/28/2015   Thrombocytopenia ([place]) 10/28/2015   Allergic rhinitis 10/28/2015   IBS (irritable bowel syndrome) 10/28/2015   Uterine leiomyoma 10/24/2014   History of right mastectomy 10/24/2014   Medicare annual wellness visit, subsequent 10/24/2014   MS (multiple sclerosis) ([place]) 09/16/2014   Hematoma  complicating a procedure 03/06/2014   Primary cancer of right female breast ([place]) 01/10/2014   SOB (shortness of breath) 03/01/2013   Bilateral lower extremity edema    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[place]) 06/08/2011   PCP:  Delsa Grana, PA-C Pharmacy:   [id number] - [place], [place] - [address] [place] [address] Phone: [phone] Fax: [phone]  [place], [place] [address] [place] [address] Phone: [phone] Fax: [phone]     Social Determinants of Health (SDOH) Interventions    Readmission Risk Interventions No flowsheet data found.

## 2020-12-14 NOTE — Progress Notes (Signed)
Occupational Therapy Treatment Patient Details Name: Rebecca [name] MRN: [id number] DOB: 04-16-1960 Today's Date: 12/14/2020   History of present illness 60 yo female presents to [facility] on 10/31 with fall and LLE pain, sustained L comminuted proximal tibia/tibial plateau fracture. s/p ORIF L tibial plateau fx on 11/2. PMH includes endocarditis with resultant mild AS, breast cancer (2015), MS, IBS, ACDF 2013, and seizures.   OT comments  Pt in bed upon arrival, max A x2 for bed mobility to sit EOB. Pt completed seated grooming task, poor sitting balance with intermittent back support and constant single arm support throughout session. Pt required max A to don RLE AFO brace/shoe, pt states she has to be standing to push heel into shoe, did not attempt during session due to safety and precautions. Pt denies pain, able to adhere to WB precautions during session. Educated pt on UE exercises to complete while in bed, including overhead and forward shoulder flexion, elbow flexion,and elbow extension. Pt verbalized and demonstrated understanding. Pt limited by impaired strength, activity tolerance, balance, and ROM affecting ADL and functional mobility performance. Will continue to follow acutely to address OT needs listed, recommend d/c to SNF.   Recommendations for follow up therapy are one component of a multi-disciplinary discharge planning process, led by the attending physician.  Recommendations may be updated based on patient status, additional functional criteria and insurance authorization.    Follow Up Recommendations  Skilled nursing-short term rehab (<3 hours/day)    Assistance Recommended at Discharge Frequent or constant Supervision/Assistance  Equipment Recommendations  Other (comment)    Recommendations for Other Services      Precautions / Restrictions Precautions Precautions: Fall Restrictions Weight Bearing Restrictions: Yes LLE Weight Bearing: Touchdown weight bearing        Mobility Bed Mobility Overal bed mobility: Needs Assistance Bed Mobility: Supine to Sit;Sit to Supine     Supine to sit: Max assist;+2 for safety/equipment;HOB elevated Sit to supine: Max assist;+2 for physical assistance   General bed mobility comments: max A x2  to move pt up in bed    Transfers                         Balance Overall balance assessment: Needs assistance;History of Falls Sitting-balance support: Feet supported Sitting balance-Leahy Scale: Poor Sitting balance - Comments: required intermittent back support throughout session, pt used bedrail/mattress for support during session Postural control: Posterior lean                                 ADL either performed or assessed with clinical judgement   ADL Overall ADL's : Needs assistance/impaired Eating/Feeding: Set up;Sitting   Grooming: Minimal assistance;Sitting Grooming Details (indicate cue type and reason): assistance needed to brush hair and don headband while seated due to need to hold on to bed for support/sitting balance             Lower Body Dressing: Maximal assistance Lower Body Dressing Details (indicate cue type and reason): attempted to don RLE AFO brace, pt states she is normally able to help push her heel in when she stands                    Extremity/Trunk Assessment Upper Extremity Assessment Upper Extremity Assessment: Overall WFL for tasks assessed;Generalized weakness   Lower Extremity Assessment Lower Extremity Assessment: Defer to PT evaluation  Vision   Vision Assessment?: No apparent visual deficits   Perception Perception Perception: Within Functional Limits   Praxis Praxis Praxis: Not tested    Cognition Arousal/Alertness: Awake/alert Behavior During Therapy: WFL for tasks assessed/performed Overall Cognitive Status: Within Functional Limits for tasks assessed                                             Exercises Exercises: General Upper Extremity General Exercises - Upper Extremity Shoulder Flexion: AROM;Seated;Theraband;10 reps Theraband Level (Shoulder Flexion): Level 1 (Yellow) Elbow Flexion: AROM;Seated;Theraband;10 reps Theraband Level (Elbow Flexion): Level 1 (Yellow) Elbow Extension: AROM;Seated;Theraband;10 reps Theraband Level (Elbow Extension): Level 1 (Yellow)   Shoulder Instructions       General Comments      Pertinent Vitals/ Pain       Pain Assessment: No/denies pain  Home Living                                          Prior Functioning/Environment              Frequency  Min 2X/week        Progress Toward Goals  OT Goals(current goals can now be found in the care plan section)  Progress towards OT goals: Progressing toward goals  Acute Rehab OT Goals Patient Stated Goal: return to PLOF OT Goal Formulation: With patient Time For Goal Achievement: 12/25/20 Potential to Achieve Goals: Fair ADL Goals Pt Will Perform Grooming: with modified independence;sitting Pt/caregiver will Perform Home Exercise Program: Increased strength;Both right and left upper extremity;With theraband;Independently;With written HEP provided Additional ADL Goal #1: Pt to complete bed mobility at Min A in prep for ADL transfers Additional ADL Goal #2: Pt to demo ability to maintain sitting balance EOB during dynamic functional tasks with no more than mim guard to maintain balance Additional ADL Goal #3: Pt to demo sit to stand transfer at Mod A x 2 in prep for ADL transfers  Plan Discharge plan remains appropriate;Frequency remains appropriate    Co-evaluation    PT/OT/SLP Co-Evaluation/Treatment: Yes Reason for Co-Treatment: To address functional/ADL transfers;For patient/therapist safety   OT goals addressed during session: ADL's and self-care;Strengthening/ROM      AM-PAC OT "6 Clicks" Daily Activity     Outcome Measure   Help from another  person eating meals?: None Help from another person taking care of personal grooming?: A Little Help from another person toileting, which includes using toliet, bedpan, or urinal?: Total Help from another person bathing (including washing, rinsing, drying)?: Total Help from another person to put on and taking off regular upper body clothing?: A Little Help from another person to put on and taking off regular lower body clothing?: A Lot 6 Click Score: 14    End of Session    OT Visit Diagnosis: Unsteadiness on feet (R26.81);Other abnormalities of gait and mobility (R26.89);Muscle weakness (generalized) (M62.81)   Activity Tolerance Patient tolerated treatment well   Patient Left in bed;with call bell/phone within reach;with bed alarm set   Nurse Communication Mobility status        Time: 5329-9242 OT Time Calculation (min): 36 min  Charges: OT General Charges $OT Visit: 1 Visit OT Treatments $Self Care/Home Management : 8-22 mins  Giara Mcgaughey, OTD, OTR/L Acute Rehab [phone] -  [place] 12/14/2020, 12:39 PM

## 2020-12-14 NOTE — Care Management Important Message (Signed)
Important Message  Patient Details  Name: Rebecca [name] MRN: [id number] Date of Birth: 1961-02-02   Medicare Important Message Given:  Yes     [name] 12/14/2020, 1:40 PM

## 2020-12-14 NOTE — Discharge Summary (Signed)
Physician Discharge Summary  Rebecca [name] FIE:[id number] DOB: 12/30/1960 DOA: 12/08/2020  PCP: Delsa Grana, PA-C  Admit date: 12/08/2020 Discharge date: 12/14/2020  Admitted From: Home Discharge disposition: SNF   Code Status: Full Code   Discharge Diagnosis:   Principal Problem:   Tibia/fibula fracture, left, closed, initial encounter Active Problems:   MS (multiple sclerosis) ([place])   Seizure disorder ([place])   Neurogenic bladder   Ambulatory dysfunction     Brief narrative: Rebecca [name] is a 60 y.o. 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 On Saturday 10/30, patient got up from her potty chair and attempted to move to the lift chair with a walker.  In the process, her left leg got stuck on the carpet because it twisted awkwardly and felt a snap on her leg.  EMS came and lifted her back into the lift chair versus declined across port to ED at that time.  She was unable to get back up since then and hence he called EMS again 11/31.  She could not stand at all and hence agreed to be brought to the ED at [facility].  Imagings of left knee showed severely displaced and comminuted proximal left tibial fracture which appears to extend to the articular surface of the lateral tibial plateau.  Also noted to have moderately displaced and comminuted proximal left fibula fracture.   Because of the complexity of her fracture, patient was transferred to [facility] Admitted to hospitalist service. On 11/2, patient underwent ORIF of left tibial plateau fracture by Dr. Doreatha Martin   Subjective: Patient was seen and examined this morning.  Propped up in bed.  Not in distress. She states that she is getting productive  cough with phlegm and some sore throat.  She is worried if she caught RSV from other sick patients while she was waiting for several hours in [facility] ED .  Assessment/Plan: Left tibia/left fibula fracture -Sustained fracture after mechanical fall. -11/2, underwent ORIF of left tibial fracture. -DVT prophylaxis and pain per orthopedics team. -PT eval obtained.  SNF recommended.  Left leg cellulitis Chronic lymphedema of bilateral lower extremities -On 11/4, she was noted to have acute cellulitis of left leg with redness, tenderness and increased swelling.  Wound care consult was obtained.  Patient was started on IV Rocephin.  It seems to be clinically improving in terms of redness, swelling and pain.  No fever.  WBC count normal.  DVT scan negative.  Currently on IV Rocephin.  5 more days of oral Omnicef at discharge. Recent Labs  Lab 12/09/20 0228 12/10/20 0501 12/11/20 0332 12/12/20 0500 12/14/20 1420  WBC 4.7 6.2 5.8 5.6 4.1   Cough, sore throat -Chest x-ray was obtained which showed possible bronchitis without any consolidation or collapse.  -Supportive care.  Already on antibiotics for another reason.  Hepatic steatosis  -Elevated liver enzymes noted on 11/3.  Right upper quadrant ultrasound showed diffuse hepatic steatosis.  Hepatitis panel negative.  Liver enzymes gradually improving. -Lipid panel 11/4 with LDL at 77, HDL at 48. Recent Labs  Lab 12/08/20 1222 12/10/20 0501 12/11/20 0332 12/12/20 0500 12/14/20 1420  AST 156* 175* 70* 85* 57*  ALT 108* 235* 129* 142* 111*  ALKPHOS 131* 161*  141* 167* 187*  BILITOT 1.1 0.7 0.9 1.1 0.4  PROT 6.8 5.7* 5.9* 6.1* 6.3*  ALBUMIN 3.4* 2.8* 2.8* 2.7* 2.7*    Acute blood loss anemia -Hemoglobin seems to be normal at baseline.  Postoperatively, hemoglobin has been running low and stable between 7 and 8.  No active evidence of bleeding anywhere else. -Avoiding heparin products.  Currently on Lovenox for DVT prophylaxis. Recent Labs     12/09/20 0228 12/10/20 0501 12/11/20 0332 12/12/20 0500 12/14/20 1420  HGB 8.2* 7.4* 7.3* 7.8* 7.9*  MCV 90.5 89.8 89.7 89.6 90.6   Multiple sclerosis -She is minimally ambulatory at baseline -Continue amantadine, Baclofen, Zanaflex -She is on Rebif 3x/week.  Continue the same   Seizure d/o -Continue Keppra   Neurogenic bladder -Has an indwelling foley -Continue Ditropan.  Not on chronic antibiotics.   Obesity -Body mass index is 36.73 kg/m. Patient has been advised to make an attempt to improve diet and exercise patterns to aid in weight loss.   Allergies as of 12/14/2020       Reactions   Fentanyl Nausea And Vomiting, Nausea Only   vomiting Was given in PACU x3 each time patient got sick   Sulfa Antibiotics Hives, Other (See Comments)   Light headed, over heated        Medication List     TAKE these medications    amantadine 100 MG capsule Commonly known as: SYMMETREL Take 1 capsule (100 mg total) by mouth 3 (three) times daily.   Assurance Vinyl Exam Gloves Misc Neurogenic bladder; MS; LON 99 months; for use for personal hygiene   baclofen 10 MG tablet Commonly known as: LIORESAL TAKE 1 TO 2 TABLETS BY MOUTH 3 TIMES A DAY What changed: See the new instructions.   bisacodyl 5 MG EC tablet Commonly known as: DULCOLAX Take 1 tablet (5 mg total) by mouth daily as needed for moderate constipation.   cefdinir 300 MG capsule Commonly known as: OMNICEF Take 1 capsule (300 mg total) by mouth 2 (two) times daily for 5 days.   COSAMIN DS PO Take 1 tablet by mouth 5 (five) times daily.   Creon 36000 UNITS Cpep capsule Generic drug: lipase/protease/amylase TAKE 1 CAPSULE BY MOUTH WITH BREAKFAST, WITH LUNCH, AND WITH EVENING MEAL AND 1 CAPSULE WITH SNACKS. What changed: See the new instructions.   docusate sodium 100 MG capsule Commonly known as: COLACE Take 1 capsule (100 mg total) by mouth 2 (two) times daily.   enoxaparin 40 MG/0.4ML  injection Commonly known as: LOVENOX Inject 0.4 mLs (40 mg total) into the skin daily.   feeding supplement Liqd Take 237 mLs by mouth daily.   Hair Skin and Nails Formula Tabs Take 1 tablet by mouth 2 (two) times daily.   HYDROcodone-acetaminophen 7.5-325 MG tablet Commonly known as: NORCO Take 1-2 tablets by mouth every 6 (six) hours as needed for severe pain or moderate pain (1 tab moderate pain, 2 tablets severe pain).   ibuprofen 600 MG tablet Commonly known as: ADVIL TAKE 1 TABLET BY MOUTH EVERY 8 HOURS AS NEEDED. What changed: reasons to take this   Leg Vein & Circulation Tabs Take 1 tablet by mouth 2 (two) times daily.   levETIRAcetam 500 MG tablet Commonly known as: KEPPRA Take 1 tablet (500 mg total) by mouth 2 (two) times daily.   lidocaine-prilocaine cream Commonly known as: EMLA Apply 1 application topically as needed.   loratadine 10 MG tablet Commonly known as: CLARITIN Take 10 mg by  mouth daily.   MULTIVITAMIN PO Take 1 tablet by mouth daily.   nystatin powder Commonly known as: MYCOSTATIN/NYSTOP Apply 1 application topically 3 (three) times daily as needed. For rash/raw skin as needed   omeprazole 40 MG capsule Commonly known as: PRILOSEC TAKE 1 CAPSULE (40 MG TOTAL) BY MOUTH DAILY. What changed: additional instructions   oxybutynin 10 MG 24 hr tablet Commonly known as: DITROPAN-XL TAKE 1 TABLET BY MOUTH TWICE A DAY What changed: when to take this   polyethylene glycol 17 g packet Commonly known as: MIRALAX / GLYCOLAX Take 17 g by mouth daily.   Rebif Rebidose 44 MCG/0.5ML Soaj Generic drug: Interferon Beta-1a Inject  44 mcg Sub-q 3 times weekly Rotate site after each injection What changed: See the new instructions.   tiZANidine 4 MG tablet Commonly known as: ZANAFLEX TAKE 1 TABLET (4 MG TOTAL) BY MOUTH 2 (TWO) TIMES DAILY. What changed: when to take this   Turmeric 500 MG Caps Take 500 mg by mouth daily.   Vitamin D3 25 MCG  tablet Commonly known as: Vitamin D Take 1 tablet (1,000 Units total) by mouth daily.               Discharge Care Instructions  (From admission, onward)           Start     Ordered   12/14/20 0000  No dressing needed        12/14/20 1551            Discharge Instructions:  Diet Recommendation: Regular diet   @BRDDSCINSTRUCTIONS @  Follow ups:    Follow-up Information     Haddix, Thomasene Lot, MD. Schedule an appointment as soon as possible for a visit in 2 week(s).   Specialty: Orthopedic Surgery Why: for wound check, repeat x-rays, suture removal Contact information: [place] [address] [phone]         Delsa Grana, PA-C Follow up.   Specialty: Family Medicine Contact information: [address] [place] [address] [phone]                 Wound care:   Incision (Closed) 12/09/20 Leg Left (Active)  Date First Assessed/Time First Assessed: 12/09/20 0950   Location: Leg  Location Orientation: Left    Assessments 12/09/2020 10:10 AM 12/14/2020 10:00 AM  Dressing Type Gauze (Comment);Compression wrap Adhesive bandage  Dressing Clean;Dry;Intact Dry;Intact  Site / Wound Assessment -- Dressing in place / Unable to assess  Drainage Amount -- None  Treatment -- Off loading     No Linked orders to display    Discharge Exam:   Vitals:   12/13/20 0748 12/13/20 1259 12/13/20 2200 12/14/20 0755  BP: 123/72 121/70 127/65 122/64  Pulse: 89 89 96 84  Resp: 17 18 18 19   Temp: 98.6 F (37 C) 98 F (36.7 C) 98 F (36.7 C) 97.6 F (36.4 C)  TempSrc: Oral Oral Oral   SpO2: 90% 90% 93% 92%  Weight:      Height:        Body mass index is 36.73 kg/m.  General exam: Pleasant, not in distress Skin: No rashes, lesions or ulcers. HEENT: Atraumatic, normocephalic, no obvious bleeding Lungs: Clear to auscultation bilaterally CVS: Regular rate and rhythm, no murmur GI/Abd soft, nontender, nondistended,  bowel sound present CNS: Alert, awake, oriented x3 Psychiatry: Mood appropriate Extremities: Chronic bilateral lower extremity edema, left leg cellulitis, improving.  Surgical site on the lateral aspect of the  left knee intact.  Time coordinating discharge: 35 minutes   The results of significant diagnostics from this hospitalization (including imaging, microbiology, ancillary and laboratory) are listed below for reference.    Procedures and Diagnostic Studies:   DG Knee 1-2 Views Left  Result Date: 12/09/2020 CLINICAL DATA:  Fractured tibia and fibula EXAM: LEFT KNEE - 1-2 VIEW COMPARISON:  12/07/2020 FINDINGS: Fluoroscopic images show internal fixation of comminuted displaced fracture of proximal tibia. There is improvement in alignment with no evidence of displacement or angulation at the fracture site in the final images. There is a comminuted fracture in the neck of left fibula. Fluoroscopic time was 61 seconds.  Radiation dose is 4.65 mGy. IMPRESSION: Fluoroscopic assistance was provided for internal fixation of comminuted fracture of left tibia. Electronically Signed   By: Elmer Picker M.D.   On: 12/09/2020 10:23   DG Knee Left Port  Result Date: 12/09/2020 CLINICAL DATA:  Postop left proximal tibia and fibula surgery. EXAM: PORTABLE LEFT KNEE - 1-2 VIEW COMPARISON:  Same-day intraoperative radiograph at 9:22 a.m., CT knee 12/07/2020 FINDINGS: Interval ORIF of comminuted proximal tibia fracture with lateral plate and screw fixation. Fracture fragments are in anatomic alignment. Hardware is intact without evidence of complication. Mildly displaced, mildly impacted comminuted transverse fracture of the proximal fibula is unchanged. Small amount of subcutaneous emphysema, consistent with immediate postoperative status. IMPRESSION: 1. Expected postoperative changes status post ORIF of comminuted proximal left tibia fracture. 2. Unchanged mildly displaced, mildly impacted comminuted  fracture of the proximal left fibula. Electronically Signed   By: Ileana Roup M.D.   On: 12/09/2020 11:12   DG C-Arm 1-60 Min-No Report  Result Date: 12/09/2020 Fluoroscopy was utilized by the requesting physician.  No radiographic interpretation.     Labs:   Basic Metabolic Panel: Recent Labs  Lab 12/09/20 0228 12/10/20 0501 12/11/20 0332 12/12/20 0500 12/14/20 1420  NA 133* 134* 134* 133* 134*  K 3.6 4.0 3.7 3.7 3.7  CL 103 101 100 99 97*  CO2 26 28 28 27 28   GLUCOSE 118* 140* 127* 131* 133*  BUN 12 13 12 8 13   CREATININE 0.52 0.53 0.45 0.47 0.44  CALCIUM 8.2* 8.3* 8.4* 8.6* 8.4*   GFR Estimated Creatinine Clearance: 84.6 mL/min (by C-G formula based on SCr of 0.44 mg/dL). Liver Function Tests: Recent Labs  Lab 12/08/20 1222 12/10/20 0501 12/11/20 0332 12/12/20 0500 12/14/20 1420  AST 156* 175* 70* 85* 57*  ALT 108* 235* 129* 142* 111*  ALKPHOS 131* 161* 141* 167* 187*  BILITOT 1.1 0.7 0.9 1.1 0.4  PROT 6.8 5.7* 5.9* 6.1* 6.3*  ALBUMIN 3.4* 2.8* 2.8* 2.7* 2.7*   No results for input(s): LIPASE, AMYLASE in the last 168 hours. No results for input(s): AMMONIA in the last 168 hours. Coagulation profile Recent Labs  Lab 12/11/20 0332  INR 1.1    CBC: Recent Labs  Lab 12/07/20 1859 12/09/20 0228 12/10/20 0501 12/11/20 0332 12/12/20 0500 12/14/20 1420  WBC 6.0 4.7 6.2 5.8 5.6 4.1  NEUTROABS 3.9  --  4.8 4.1 4.2 2.6  HGB 10.5* 8.2* 7.4* 7.3* 7.8* 7.9*  HCT 31.2* 24.9* 22.0* 21.7* 23.3* 24.0*  MCV 90.4 90.5 89.8 89.7 89.6 90.6  PLT 138* 121* 128* 134* 150 198   Cardiac Enzymes: No results for input(s): CKTOTAL, CKMB, CKMBINDEX, TROPONINI in the last 168 hours. BNP: Invalid input(s): POCBNP CBG: No results for input(s): GLUCAP in the last 168 hours. D-Dimer No results for input(s): DDIMER in the last  72 hours. Hgb A1c No results for input(s): HGBA1C in the last 72 hours. Lipid Profile No results for input(s): CHOL, HDL, LDLCALC, TRIG, CHOLHDL,  LDLDIRECT in the last 72 hours. Thyroid function studies No results for input(s): TSH, T4TOTAL, T3FREE, THYROIDAB in the last 72 hours.  Invalid input(s): FREET3 Anemia work up No results for input(s): VITAMINB12, FOLATE, FERRITIN, TIBC, IRON, RETICCTPCT in the last 72 hours. Microbiology Recent Results (from the past 240 hour(s))  Resp Panel by RT-PCR (Flu A&B, Covid) Nasopharyngeal Swab     Status: None   Collection Time: 12/07/20  6:59 PM   Specimen: Nasopharyngeal Swab; Nasopharyngeal(NP) swabs in vial transport medium  Result Value Ref Range Status   SARS Coronavirus 2 by RT PCR NEGATIVE NEGATIVE Final    Comment: (NOTE) SARS-CoV-2 target nucleic acids are NOT DETECTED.  The SARS-CoV-2 RNA is generally detectable in upper respiratory specimens during the acute phase of infection. The lowest concentration of SARS-CoV-2 viral copies this assay can detect is 138 copies/mL. A negative result does not preclude SARS-Cov-2 infection and should not be used as the sole basis for treatment or other patient management decisions. A negative result may occur with  improper specimen collection/handling, submission of specimen other than nasopharyngeal swab, presence of viral mutation(s) within the areas targeted by this assay, and inadequate number of viral copies(<138 copies/mL). A negative result must be combined with clinical observations, patient history, and epidemiological information. The expected result is Negative.  Fact Sheet for Patients:  EntrepreneurPulse.com.au  Fact Sheet for Healthcare Providers:  IncredibleEmployment.be  This test is no t yet approved or cleared by the [place] FDA and  has been authorized for detection and/or diagnosis of SARS-CoV-2 by FDA under an Emergency Use Authorization (EUA). This EUA will remain  in effect (meaning this test can be used) for the duration of the COVID-19 declaration under Section  564(b)(1) of the Act, 21 U.S.C.section 360bbb-3(b)(1), unless the authorization is terminated  or revoked sooner.       Influenza A by PCR NEGATIVE NEGATIVE Final   Influenza B by PCR NEGATIVE NEGATIVE Final    Comment: (NOTE) The Xpert Xpress SARS-CoV-2/FLU/RSV plus assay is intended as an aid in the diagnosis of influenza from Nasopharyngeal swab specimens and should not be used as a sole basis for treatment. Nasal washings and aspirates are unacceptable for Xpert Xpress SARS-CoV-2/FLU/RSV testing.  Fact Sheet for Patients: EntrepreneurPulse.com.au  Fact Sheet for Healthcare Providers: IncredibleEmployment.be  This test is not yet approved or cleared by the [place] FDA and has been authorized for detection and/or diagnosis of SARS-CoV-2 by FDA under an Emergency Use Authorization (EUA). This EUA will remain in effect (meaning this test can be used) for the duration of the COVID-19 declaration under Section 564(b)(1) of the Act, 21 U.S.C. section 360bbb-3(b)(1), unless the authorization is terminated or revoked.  Performed at [facility], [address], [place], [place] [address]   Surgical pcr screen     Status: Abnormal   Collection Time: 12/08/20  3:36 PM   Specimen: Nasal Mucosa; Nasal Swab  Result Value Ref Range Status   MRSA, PCR NEGATIVE NEGATIVE Final   Staphylococcus aureus POSITIVE (A) NEGATIVE Final    Comment: (NOTE) The Xpert SA Assay (FDA approved for NASAL specimens in patients 54 years of age and older), is one component of a comprehensive surveillance program. It is not intended to diagnose infection nor to guide or monitor treatment. Performed at [facility], [place]  [address], [place], [place] [address]      Signed: Terrilee Croak  [facility] 12/14/2020, 3:52 PM

## 2020-12-14 NOTE — Progress Notes (Signed)
Mobility Specialist Progress Note   12/14/20 1216  Mobility  Activity Refused mobility   Pt claimed to be exhausted from previous therapy sessions earlier today and would prefer for [place] to come tomorrow.  Holland Falling Mobility Specialist Phone Number [phone]

## 2020-12-14 NOTE — Progress Notes (Signed)
Orthopaedic Trauma Progress Note  SUBJECTIVE: Patient doing fairly well this morning.  Pain controlled.  Feels like leg appears less red. Bruising is stable.  Patient mains agreeable to SNF.  No other issues of note.  OBJECTIVE:  Vitals:   12/13/20 2200 12/14/20 0755  BP: 127/65 122/64  Pulse: 96 84  Resp: 18 19  Temp: 98 F (36.7 C) 97.6 F (36.4 C)  SpO2: 93% 92%    General: Sitting up in bed comfortably, no acute distress respiratory: No increased work of breathing.  LLE: Mepilex dressings removed, incisions are CDI.  Notable swelling, bruising, redness to the lower leg.  These appear stable and even improving from previous exam.  Able to wiggle toes.  Patient holds foot in slightly externally rotated position, she notes this is baseline.  Endorses sensation throughout extremity. Foot warm and well perfused. Tolerates gentle ankle ROM. +DP pulse  IMAGING: Stable post op imaging.   LABS:  Results for orders placed or performed during the hospital encounter of 12/08/20 (from the past 168 hour(s))  Surgical pcr screen   Collection Time: 12/08/20  3:36 PM   Specimen: Nasal Mucosa; Nasal Swab  Result Value Ref Range   MRSA, PCR NEGATIVE NEGATIVE   Staphylococcus aureus POSITIVE (A) NEGATIVE  HIV Antibody (routine testing w rflx)   Collection Time: 12/08/20  6:59 PM  Result Value Ref Range   HIV Screen 4th Generation wRfx Non Reactive Non Reactive  VITAMIN D 25 Hydroxy (Vit-D Deficiency, Fractures)   Collection Time: 12/08/20  6:59 PM  Result Value Ref Range   Vit D, 25-Hydroxy 28.25 (L) 30 - 100 ng/mL  CBC   Collection Time: 12/09/20  2:28 AM  Result Value Ref Range   WBC 4.7 4.0 - 10.5 K/uL   RBC 2.75 (L) 3.87 - 5.11 MIL/uL   Hemoglobin 8.2 (L) 12.0 - 15.0 g/dL   HCT 24.9 (L) 36.0 - 46.0 %   MCV 90.5 80.0 - 100.0 fL   MCH 29.8 26.0 - 34.0 pg   MCHC 32.9 30.0 - 36.0 g/dL   RDW 12.9 11.5 - 15.5 %   Platelets 121 (L) 150 - 400 K/uL   nRBC 0.0 0.0 - 0.2 %  Basic metabolic  panel   Collection Time: 12/09/20  2:28 AM  Result Value Ref Range   Sodium 133 (L) 135 - 145 mmol/L   Potassium 3.6 3.5 - 5.1 mmol/L   Chloride 103 98 - 111 mmol/L   CO2 26 22 - 32 mmol/L   Glucose, Bld 118 (H) 70 - 99 mg/dL   BUN 12 6 - 20 mg/dL   Creatinine, Ser 0.52 0.44 - 1.00 mg/dL   Calcium 8.2 (L) 8.9 - 10.3 mg/dL   GFR, Estimated >60 >60 mL/min   Anion gap 4 (L) 5 - 15  Type and screen [place]   Collection Time: 12/09/20  8:30 AM  Result Value Ref Range   ABO/RH(D) O POS    Antibody Screen NEG    Sample Expiration      12/12/2020,2359 Performed at [facility], [place]. [address], [place], [place] [address]   CBC with Differential/Platelet   Collection Time: 12/10/20  5:01 AM  Result Value Ref Range   WBC 6.2 4.0 - 10.5 K/uL   RBC 2.45 (L) 3.87 - 5.11 MIL/uL   Hemoglobin 7.4 (L) 12.0 - 15.0 g/dL   HCT 22.0 (L) 36.0 - 46.0 %   MCV 89.8 80.0 - 100.0 fL  MCH 30.2 26.0 - 34.0 pg   MCHC 33.6 30.0 - 36.0 g/dL   RDW 12.4 11.5 - 15.5 %   Platelets 128 (L) 150 - 400 K/uL   nRBC 0.0 0.0 - 0.2 %   Neutrophils Relative % 77 %   Neutro Abs 4.8 1.7 - 7.7 K/uL   Lymphocytes Relative 12 %   Lymphs Abs 0.8 0.7 - 4.0 K/uL   Monocytes Relative 10 %   Monocytes Absolute 0.6 0.1 - 1.0 K/uL   Eosinophils Relative 0 %   Eosinophils Absolute 0.0 0.0 - 0.5 K/uL   Basophils Relative 0 %   Basophils Absolute 0.0 0.0 - 0.1 K/uL   Immature Granulocytes 1 %   Abs Immature Granulocytes 0.04 0.00 - 0.07 K/uL  Comprehensive metabolic panel   Collection Time: 12/10/20  5:01 AM  Result Value Ref Range   Sodium 134 (L) 135 - 145 mmol/L   Potassium 4.0 3.5 - 5.1 mmol/L   Chloride 101 98 - 111 mmol/L   CO2 28 22 - 32 mmol/L   Glucose, Bld 140 (H) 70 - 99 mg/dL   BUN 13 6 - 20 mg/dL   Creatinine, Ser 0.53 0.44 - 1.00 mg/dL   Calcium 8.3 (L) 8.9 - 10.3 mg/dL   Total Protein 5.7 (L) 6.5 - 8.1 g/dL   Albumin 2.8 (L) 3.5 - 5.0 g/dL   AST 175 (H) 15 - 41 U/L   ALT  235 (H) 0 - 44 U/L   Alkaline Phosphatase 161 (H) 38 - 126 U/L   Total Bilirubin 0.7 0.3 - 1.2 mg/dL   GFR, Estimated >60 >60 mL/min   Anion gap 5 5 - 15  Hepatitis panel, acute   Collection Time: 12/10/20  8:05 AM  Result Value Ref Range   Hepatitis B Surface Ag NON REACTIVE NON REACTIVE   HCV Ab NON REACTIVE NON REACTIVE   Hep A IgM NON REACTIVE NON REACTIVE   Hep B C IgM NON REACTIVE NON REACTIVE  CBC with Differential/Platelet   Collection Time: 12/11/20  3:32 AM  Result Value Ref Range   WBC 5.8 4.0 - 10.5 K/uL   RBC 2.42 (L) 3.87 - 5.11 MIL/uL   Hemoglobin 7.3 (L) 12.0 - 15.0 g/dL   HCT 21.7 (L) 36.0 - 46.0 %   MCV 89.7 80.0 - 100.0 fL   MCH 30.2 26.0 - 34.0 pg   MCHC 33.6 30.0 - 36.0 g/dL   RDW 13.0 11.5 - 15.5 %   Platelets 134 (L) 150 - 400 K/uL   nRBC 0.3 (H) 0.0 - 0.2 %   Neutrophils Relative % 70 %   Neutro Abs 4.1 1.7 - 7.7 K/uL   Lymphocytes Relative 17 %   Lymphs Abs 1.0 0.7 - 4.0 K/uL   Monocytes Relative 10 %   Monocytes Absolute 0.6 0.1 - 1.0 K/uL   Eosinophils Relative 2 %   Eosinophils Absolute 0.1 0.0 - 0.5 K/uL   Basophils Relative 0 %   Basophils Absolute 0.0 0.0 - 0.1 K/uL   Immature Granulocytes 1 %   Abs Immature Granulocytes 0.05 0.00 - 0.07 K/uL  Comprehensive metabolic panel   Collection Time: 12/11/20  3:32 AM  Result Value Ref Range   Sodium 134 (L) 135 - 145 mmol/L   Potassium 3.7 3.5 - 5.1 mmol/L   Chloride 100 98 - 111 mmol/L   CO2 28 22 - 32 mmol/L   Glucose, Bld 127 (H) 70 - 99 mg/dL   BUN  12 6 - 20 mg/dL   Creatinine, Ser 0.45 0.44 - 1.00 mg/dL   Calcium 8.4 (L) 8.9 - 10.3 mg/dL   Total Protein 5.9 (L) 6.5 - 8.1 g/dL   Albumin 2.8 (L) 3.5 - 5.0 g/dL   AST 70 (H) 15 - 41 U/L   ALT 129 (H) 0 - 44 U/L   Alkaline Phosphatase 141 (H) 38 - 126 U/L   Total Bilirubin 0.9 0.3 - 1.2 mg/dL   GFR, Estimated >60 >60 mL/min   Anion gap 6 5 - 15  Protime-INR   Collection Time: 12/11/20  3:32 AM  Result Value Ref Range   Prothrombin Time  14.1 11.4 - 15.2 seconds   INR 1.1 0.8 - 1.2  Lipid panel   Collection Time: 12/11/20  3:05 PM  Result Value Ref Range   Cholesterol 138 0 - 200 mg/dL   Triglycerides 63 <150 mg/dL   HDL 48 >40 mg/dL   Total CHOL/HDL Ratio 2.9 RATIO   VLDL 13 0 - 40 mg/dL   LDL Cholesterol 77 0 - 99 mg/dL  CBC with Differential/Platelet   Collection Time: 12/12/20  5:00 AM  Result Value Ref Range   WBC 5.6 4.0 - 10.5 K/uL   RBC 2.60 (L) 3.87 - 5.11 MIL/uL   Hemoglobin 7.8 (L) 12.0 - 15.0 g/dL   HCT 23.3 (L) 36.0 - 46.0 %   MCV 89.6 80.0 - 100.0 fL   MCH 30.0 26.0 - 34.0 pg   MCHC 33.5 30.0 - 36.0 g/dL   RDW 12.9 11.5 - 15.5 %   Platelets 150 150 - 400 K/uL   nRBC 0.4 (H) 0.0 - 0.2 %   Neutrophils Relative % 75 %   Neutro Abs 4.2 1.7 - 7.7 K/uL   Lymphocytes Relative 12 %   Lymphs Abs 0.7 0.7 - 4.0 K/uL   Monocytes Relative 11 %   Monocytes Absolute 0.6 0.1 - 1.0 K/uL   Eosinophils Relative 1 %   Eosinophils Absolute 0.0 0.0 - 0.5 K/uL   Basophils Relative 0 %   Basophils Absolute 0.0 0.0 - 0.1 K/uL   Immature Granulocytes 1 %   Abs Immature Granulocytes 0.04 0.00 - 0.07 K/uL  Comprehensive metabolic panel   Collection Time: 12/12/20  5:00 AM  Result Value Ref Range   Sodium 133 (L) 135 - 145 mmol/L   Potassium 3.7 3.5 - 5.1 mmol/L   Chloride 99 98 - 111 mmol/L   CO2 27 22 - 32 mmol/L   Glucose, Bld 131 (H) 70 - 99 mg/dL   BUN 8 6 - 20 mg/dL   Creatinine, Ser 0.47 0.44 - 1.00 mg/dL   Calcium 8.6 (L) 8.9 - 10.3 mg/dL   Total Protein 6.1 (L) 6.5 - 8.1 g/dL   Albumin 2.7 (L) 3.5 - 5.0 g/dL   AST 85 (H) 15 - 41 U/L   ALT 142 (H) 0 - 44 U/L   Alkaline Phosphatase 167 (H) 38 - 126 U/L   Total Bilirubin 1.1 0.3 - 1.2 mg/dL   GFR, Estimated >60 >60 mL/min   Anion gap 7 5 - 15  Results for orders placed or performed during the hospital encounter of 12/08/20 (from the past 168 hour(s))  Resp Panel by RT-PCR (Flu A&B, Covid) Nasopharyngeal Swab   Collection Time: 12/07/20  6:59 PM    Specimen: Nasopharyngeal Swab; Nasopharyngeal(NP) swabs in vial transport medium  Result Value Ref Range   SARS Coronavirus 2 by RT PCR NEGATIVE NEGATIVE  Influenza A by PCR NEGATIVE NEGATIVE   Influenza B by PCR NEGATIVE NEGATIVE  CBC with Differential   Collection Time: 12/07/20  6:59 PM  Result Value Ref Range   WBC 6.0 4.0 - 10.5 K/uL   RBC 3.45 (L) 3.87 - 5.11 MIL/uL   Hemoglobin 10.5 (L) 12.0 - 15.0 g/dL   HCT 31.2 (L) 36.0 - 46.0 %   MCV 90.4 80.0 - 100.0 fL   MCH 30.4 26.0 - 34.0 pg   MCHC 33.7 30.0 - 36.0 g/dL   RDW 13.0 11.5 - 15.5 %   Platelets 138 (L) 150 - 400 K/uL   nRBC 0.0 0.0 - 0.2 %   Neutrophils Relative % 66 %   Neutro Abs 3.9 1.7 - 7.7 K/uL   Lymphocytes Relative 23 %   Lymphs Abs 1.4 0.7 - 4.0 K/uL   Monocytes Relative 9 %   Monocytes Absolute 0.5 0.1 - 1.0 K/uL   Eosinophils Relative 2 %   Eosinophils Absolute 0.1 0.0 - 0.5 K/uL   Basophils Relative 0 %   Basophils Absolute 0.0 0.0 - 0.1 K/uL   Immature Granulocytes 0 %   Abs Immature Granulocytes 0.02 0.00 - 0.07 K/uL  Basic metabolic panel   Collection Time: 12/07/20  6:59 PM  Result Value Ref Range   Sodium 138 135 - 145 mmol/L   Potassium 3.8 3.5 - 5.1 mmol/L   Chloride 104 98 - 111 mmol/L   CO2 28 22 - 32 mmol/L   Glucose, Bld 114 (H) 70 - 99 mg/dL   BUN 15 6 - 20 mg/dL   Creatinine, Ser 0.51 0.44 - 1.00 mg/dL   Calcium 9.2 8.9 - 10.3 mg/dL   GFR, Estimated >60 >60 mL/min   Anion gap 6 5 - 15  Hepatic function panel   Collection Time: 12/08/20 12:22 PM  Result Value Ref Range   Total Protein 6.8 6.5 - 8.1 g/dL   Albumin 3.4 (L) 3.5 - 5.0 g/dL   AST 156 (H) 15 - 41 U/L   ALT 108 (H) 0 - 44 U/L   Alkaline Phosphatase 131 (H) 38 - 126 U/L   Total Bilirubin 1.1 0.3 - 1.2 mg/dL   Bilirubin, Direct 0.3 (H) 0.0 - 0.2 mg/dL   Indirect Bilirubin 0.8 0.3 - 0.9 mg/dL   *Note: Due to a large number of results and/or encounters for the requested time period, some results have not been displayed.  A complete set of results can be found in Results Review.      ASSESSMENT: Rebecca [name] is a 60 y.o. female, 5 Days Post-Op s/p ORIF LEFT TIBIAL PLATEAU  CV/Blood loss: Hemoglobin stable.  Hemodynamically stable  PLAN: Weightbearing: TDWB LLE Incisional and dressing care: Dressings removed today, incisions clean, dry, intact.  Continue to change Mepilex as needed. Showering: Ok to shower with assistance, incisions may get wet if there is no drainage.   Orthopedic device(s): None  Pain management:  1. Tylenol 325-650 mg q 6 hours PRN 2. Baclofen 20 mg at breakfast, 10 mg at lunch, 20 mg at bedtime  3. Norco 5-325 mg OR 7.5-325 mg mg q 4 hours PRN 4. Zanaflex 4 mg QD 5. Morphine 0.5-1 mg q 2 hours PRN VTE prophylaxis: Lovenox, SCDs ID:  Ancef 2gm post op completed.  Patient started on ceftriaxone x7 days on 12/11/20 for possible LLE cellulitis Foley/Lines: Indwelling catheter in place. KVO IVFs Impediments to Fracture Healing: Vit D level 28, started on D3 supplementation Dispo:  Therapies as tolerated, PT/OT recommending SNF which patient is agreeable to.  Patient stable for discharge from orthopedic standpoint once cleared by medicine team and therapies.  TOC following, bed available today.  Insurance authorization has been approved.  Follow - up plan: 2 weeks after d/c for repeat x-rays and wound check  Contact information:  Katha Hamming MD, Patrecia Pace PA-C. After hours and holidays please check [email or web address] for group call information for [facility]   Porschea Borys A. Ricci Barker, PA-C [phone] (office) [email or web address]

## 2020-12-14 NOTE — Progress Notes (Signed)
Physical Therapy Treatment Patient Details Name: Rebecca [name] MRN: [id number] DOB: 1960-11-01 Today's Date: 12/14/2020   History of Present Illness 60 yo female [facility] 10/[facility] with fall and LLE pain, sustained L comminuted proximal tibia/tibial plateau fracture. s/p ORIF L tibial plateau fx on 11/2. PMH includes endocarditis with resultant mild AS, breast cancer (2015), MS, IBS, ACDF 2013, and seizures.    PT Comments    Pt admitted with above diagnosis. Pt was able to sit eOB for 15 min with min to min guard assist at times.  Pt brushed her teeth with support at times on trunk.  Pt also brushed her hair. Pt then participated in LE exercises with PT and UE with OT.   Pt currently with functional limitations due to strength, balance and endurance deficits. Pt will benefit from skilled PT to increase their independence and safety with mobility to allow discharge to the venue listed below.      Recommendations for follow up therapy are one component of a multi-disciplinary discharge planning process, led by the attending physician.  Recommendations may be updated based on patient status, additional functional criteria and insurance authorization.  Follow Up Recommendations  Skilled nursing-short term rehab (<3 hours/day)     Assistance Recommended at Discharge Frequent or constant Supervision/Assistance  Equipment Recommendations  Wheelchair cushion (measurements PT);Wheelchair (measurements PT)    Recommendations for Other Services       Precautions / Restrictions Precautions Precautions: Fall Precaution Comments: impaired R LE tone Restrictions LLE Weight Bearing: Touchdown weight bearing Other Position/Activity Restrictions: RLE has extension tone with efforts to stand     Mobility  Bed Mobility Overal bed mobility: Needs Assistance Bed Mobility: Supine to Sit;Sit to Supine     Supine to sit: Max assist;+2 for safety/equipment;HOB elevated;Mod assist Sit to supine:  Max assist;+2 for physical assistance   General bed mobility comments: Pt needed mod to max assist to come to EOB with pt doing a little more of the work today than last visit but still needs assist for LEs and trunk. Incr assist to lie down and max assist to move pt up in bed.    Transfers                   General transfer comment: NT as [place] Ao is safest and alerted mobility techs to lift pt OOB daily as able.    Ambulation/Gait               General Gait Details: unable   Stairs             Wheelchair Mobility    Modified Rankin (Stroke Patients Only)       Balance Overall balance assessment: Needs assistance;History of Falls Sitting-balance support: Bilateral upper extremity supported;Single extremity supported (right foot supported) Sitting balance-Leahy Scale: Poor Sitting balance - Comments: required intermittent back support throughout session, pt used bedrail/mattress for support during session.  Sat 15 min and brushed teeth and hair.  PT washed pts back as well. Postural control: Posterior lean                                  Cognition Arousal/Alertness: Awake/alert Behavior During Therapy: WFL for tasks assessed/performed Overall Cognitive Status: Within Functional Limits for tasks assessed  Exercises General Exercises - Lower Extremity Ankle Circles/Pumps: AROM;Both;Supine;10 reps Quad Sets: AROM;Both;Supine;10 reps Heel Slides: AAROM;Both;Supine;10 reps Hip ABduction/ADduction: AAROM;Both;10 reps;Supine Straight Leg Raises: AAROM;Both;10 reps;Supine    General Comments        Pertinent Vitals/Pain Pain Assessment: Faces Faces Pain Scale: Hurts little more Pain Location: LLE Pain Descriptors / Indicators: Discomfort;Guarding Pain Intervention(s): Monitored during session;Limited activity within patient's tolerance;Repositioned    Home Living                           Prior Function            PT Goals (current goals can now be found in the care plan section) Acute Rehab PT Goals Patient Stated Goal: return to PLOF Progress towards PT goals: Progressing toward goals    Frequency    Min 3X/week      PT Plan Current plan remains appropriate    Co-evaluation PT/OT/SLP Co-Evaluation/Treatment: Yes Reason for Co-Treatment: Complexity of the patient's impairments (multi-system involvement);For patient/therapist safety PT goals addressed during session: Mobility/safety with mobility        AM-PAC PT "6 Clicks" Mobility   Outcome Measure  Help needed turning from your back to your side while in a flat bed without using bedrails?: A Lot Help needed moving from lying on your back to sitting on the side of a flat bed without using bedrails?: A Lot Help needed moving to and from a bed to a chair (including a wheelchair)?: Total Help needed standing up from a chair using your arms (e.g., wheelchair or bedside chair)?: Total Help needed to walk in hospital room?: Total Help needed climbing 3-5 steps with a railing? : Total 6 Click Score: 8    End of Session Equipment Utilized During Treatment: Gait belt Activity Tolerance: Patient limited by fatigue Patient left: with call bell/phone within reach;in bed;with bed alarm set Nurse Communication: Mobility status;Need for lift equipment PT Visit Diagnosis: Other abnormalities of gait and mobility (R26.89);Muscle weakness (generalized) (M62.81);Pain Pain - Right/Left: Left Pain - part of body: Leg     Time: 1779-3903 PT Time Calculation (min) (ACUTE ONLY): 30 min  Charges:  $Therapeutic Exercise: 8-22 mins                     Rebecca [name] M,PT Acute Rehab Services [phone] (pager)    Alvira Philips 12/14/2020, 3:40 PM

## 2020-12-15 DIAGNOSIS — S82202A Unspecified fracture of shaft of left tibia, initial encounter for closed fracture: Secondary | ICD-10-CM | POA: Diagnosis not present

## 2020-12-15 DIAGNOSIS — S82402A Unspecified fracture of shaft of left fibula, initial encounter for closed fracture: Secondary | ICD-10-CM | POA: Diagnosis not present

## 2020-12-15 LAB — C-REACTIVE PROTEIN: CRP: 2.7 mg/dL — ABNORMAL HIGH (ref <1.0)

## 2020-12-15 MED ORDER — GUAIFENESIN-DM 100-10 MG/5ML PO SYRP
5.0000 mL | ORAL_SOLUTION | Freq: Four times a day (QID) | ORAL | Status: DC | PRN
  Administered 2020-12-15 – 2020-12-18 (×2): 5 mL via ORAL
  Filled 2020-12-15 (×2): qty 10

## 2020-12-15 MED ORDER — NIRMATRELVIR/RITONAVIR (PAXLOVID)TABLET
3.0000 | ORAL_TABLET | Freq: Two times a day (BID) | ORAL | Status: DC
  Administered 2020-12-15 – 2020-12-18 (×7): 3 via ORAL
  Filled 2020-12-15: qty 30

## 2020-12-15 NOTE — TOC Progression Note (Addendum)
Transition of Care [facility]) - Initial/Assessment Note    Patient Details  Name: Rebecca [name] MRN: [id number] Date of Birth: [birth year]  [facility] CM/SW Contact:    Milinda Antis, [place] Phone Number: 12/15/2020, 8:52 AM  Clinical Narrative:                 08:50-  CSW contacted Kitty with admissions at [facility] to inform the facility that the patient's COVID results have been received and to inquire about whether the facility is accepting COVID positive patients.  There was no answer.  CSW left a VM requesting a returned call.    10:00-  CSW was contacted by [place] with [facility] and informed that the patient will need to quarantine for 10 days prior to coming to the facility.  Care team notified.  15:00-  CSW attempted to contact Helene Kelp, admissions director with [facility].  There was no answer.  CSW left a VM asking if the facility is accepting COVID positive patients and is awaiting a returned call.    16:33-  CSW received a returned call from [facility].  The facility will review the patient and inform CSW when they can accept.    Expected Discharge Plan: Skilled Nursing Facility Barriers to Discharge: Continued Medical Work up   Patient Goals and CMS Choice        Expected Discharge Plan and Services Expected Discharge Plan: [place]         Expected Discharge Date: 12/14/20                                    Prior Living Arrangements/Services                       Activities of Daily Living Home Assistive Devices/Equipment: Bedside commode/3-in-1, Electric scooter, Environmental consultant (specify type) ADL Screening (condition at time of admission) Patient's cognitive ability adequate to safely complete daily activities?: Yes Is the patient deaf or have difficulty hearing?: No Does the patient have difficulty seeing, even when wearing glasses/contacts?: No Does the patient have difficulty concentrating, remembering, or making  decisions?: No Patient able to express need for assistance with ADLs?: Yes Does the patient have difficulty dressing or bathing?: No Independently performs ADLs?: Yes (appropriate for developmental age) Does the patient have difficulty walking or climbing stairs?: Yes Weakness of Legs: Both Weakness of Arms/Hands: Both  Permission Sought/Granted                  Emotional Assessment              Admission diagnosis:  Fx up tibia w/ fibula-closed [S82.109A, S82.409A] Tibia/fibula fracture, left, closed, initial encounter [Y10.175Z, S82.402A] Patient Active Problem List   Diagnosis Date Noted   Tibia/fibula fracture, left, closed, initial encounter 12/08/2020   Dysphagia    Acute esophagitis    Gastric erythema    Mucosal abnormality of duodenum    Schatzki's ring    Diarrhea 04/14/2020   Elevated BP without diagnosis of hypertension 04/14/2020   Urinary tract infection associated with indwelling urethral catheter ([place]) 02/17/2020   Muscle spasm 02/28/2019   Post-menopausal bleeding 11/20/2018   Ambulatory dysfunction 02/02/2018   Abnormality of gait 11/17/2017   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   Bilateral lower extremity pain (primary) (bilateral) (right greater than left) 08/01/2016   Chronic neck pain (secondary) (bilateral) (  left greater than right) 07/28/2016   Neutropenia ([place]) 06/27/2016   Chronic pain syndrome 06/03/2016   Neurogenic bladder    Neurogenic bowel    Detrusor and sphincter dyssynergia    Urinary incontinence    Seizure disorder ([place])    Slow transit constipation    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[place])    Lymphedema    Encounter for screening for cervical cancer 11/27/2015   Preventative health care 11/25/2015   History of breast cancer in female 10/28/2015   Anemia 10/28/2015   Thrombocytopenia ([place]) 10/28/2015   Allergic rhinitis 10/28/2015   IBS (irritable bowel syndrome) 10/28/2015   Uterine leiomyoma  10/24/2014   History of right mastectomy 10/24/2014   Medicare annual wellness visit, subsequent 10/24/2014   MS (multiple sclerosis) ([place]) 08/07/1599   Hematoma complicating a procedure 03/06/2014   Primary cancer of right female breast ([place]) 01/10/2014   SOB (shortness of breath) 03/01/2013   Bilateral lower extremity edema    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[place]) 06/08/2011   PCP:  Delsa Grana, PA-C Pharmacy:   [id number] - [place], [place] - [address] [place] [address] Phone: [phone] Fax: [phone]  [place], [place] [address] [place] [address] Phone: [phone] Fax: [phone]     Social Determinants of Health (SDOH) Interventions    Readmission Risk Interventions No flowsheet data found.

## 2020-12-15 NOTE — Progress Notes (Signed)
PROGRESS NOTE  JULLIANNA GABOR  DOB: 1960/07/18  PCP: Delsa Grana, PA-C XIP:[id number]  DOA: 12/08/2020  LOS: 7 days  Hospital Day: 8  Chief complaint: Fall   Brief narrative: Rebecca [name] is a 60 y.o. 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 On Saturday 10/30, patient got up from her potty chair and attempted to move to the lift chair with a walker.  In the process, her left leg got stuck on the carpet because it twisted awkwardly and felt a snap on her leg.  EMS came and lifted her back into the lift chair versus declined across port to ED at that time.  She was unable to get back up since then and hence he called EMS again 11/31.  She could not stand at all and hence agreed to be brought to the ED at [facility].  Imagings of left knee showed severely displaced and comminuted proximal left tibial fracture which appears to extend to the articular surface of the lateral tibial plateau.  Also noted to have moderately displaced and comminuted proximal left fibula fracture.   Because of the complexity of her fracture, patient was transferred to [facility] Admitted to hospitalist service. On 11/2, patient underwent ORIF of left tibial plateau fracture by Dr. Doreatha Martin   Subjective: Patient was seen and examined this morning.  Propped up in bed.  Not in distress.  Continues to have cough with phlegm.  No fever.  Not requiring supplemental oxygen. COVID-positive yesterday.  Assessment/Plan: #COVID infection -On 11/7, patient complained of cough, sore throat. -Chest x-ray showed possible bronchitis without any consolidation or collapse.  COVID test was positive. -WC count normal.  Pending CRP. -Because of her  immunocompromise status and high risk of progression, I will start her on Paxlovid.  -Discussed the risk and benefit with patient. -Robitussin as needed for cough. Recent Labs  Lab 12/08/20 1222 12/09/20 0228 12/10/20 0501 12/11/20 0332 12/12/20 0500 12/14/20 1401 12/14/20 1420  SARSCOV2NAA  --   --   --   --   --  POSITIVE*  --   WBC  --  4.7 6.2 5.8 5.6  --  4.1  ALT 108*  --  235* 129* 142*  --  111*   Left tibia/left fibula fracture -Sustained fracture after mechanical fall. -11/2, underwent ORIF of left tibial fracture. -DVT prophylaxis and pain per orthopedics team. -PT eval obtained.  SNF recommended.  Left leg cellulitis Chronic lymphedema of bilateral lower extremities -On 11/4, she was noted to have acute cellulitis of left leg with redness, tenderness and increased swelling.  Wound care consult was obtained.  Patient was started on IV Rocephin.  It seems to be clinically improving in terms of redness, swelling and pain.  No fever.  WBC count stable.  DVT scan negative.  -Continue IV Rocephin for now. -If any suggestion of worsening, may need an MRI. Recent Labs  Lab 12/09/20 0228 12/10/20 0501 12/11/20 0332 12/12/20 0500 12/14/20 1420  WBC 4.7 6.2 5.8 5.6 4.1   Hepatic steatosis  -Elevated liver enzymes noted on 11/3.  Right upper quadrant ultrasound showed diffuse hepatic steatosis.  Hepatitis panel negative.  Liver enzymes improved at first but gradually worsening again without any clinical  symptoms. -Lipid panel 11/4 with LDL at 77, HDL at 48. Recent Labs  Lab 12/08/20 1222 12/10/20 0501 12/11/20 0332 12/12/20 0500 12/14/20 1420  AST 156* 175* 70* 85* 57*  ALT 108* 235* 129* 142* 111*  ALKPHOS 131* 161* 141* 167* 187*  BILITOT 1.1 0.7 0.9 1.1 0.4  PROT 6.8 5.7* 5.9* 6.1* 6.3*  ALBUMIN 3.4* 2.8* 2.8* 2.7* 2.7*    Acute blood loss anemia -Hemoglobin seems to be normal at baseline.  Postoperatively, hemoglobin has been running low and stable between 7  and 8.  No active evidence of bleeding anywhere else. -Avoiding heparin products.  Currently on SCD for DVT prophylaxis. Recent Labs    12/09/20 0228 12/10/20 0501 12/11/20 0332 12/12/20 0500 12/14/20 1420  HGB 8.2* 7.4* 7.3* 7.8* 7.9*  MCV 90.5 89.8 89.7 89.6 90.6   Multiple sclerosis -She is minimally ambulatory at baseline -Continue amantadine, Baclofen, Zanaflex -She is on Rebif 3x/week.  Continue the same   Seizure d/o -Continue Keppra   Neurogenic bladder -Has a chronic indwelling foley -Continue Ditropan.  Not on chronic antibiotics.   Obesity -Body mass index is 36.73 kg/m. Patient has been advised to make an attempt to improve diet and exercise patterns to aid in weight loss.   Mobility: Encourage ambulation.  PT eval appreciated Living condition: Lives at home Goals of care:   Code Status: Full Code  Nutritional status: Body mass index is 36.73 kg/m. Nutrition Problem: Increased nutrient needs Etiology: post-op healing Signs/Symptoms: estimated needs Diet:  Diet Order             Diet general           Diet Heart Room service appropriate? Yes; Fluid consistency: Thin  Diet effective now                  DVT prophylaxis:  enoxaparin (LOVENOX) injection 40 mg Start: 12/11/20 1000 SCDs Start: 12/09/20 1120 SCDs Start: 12/08/20 1809   Antimicrobials:.  IV Rocephin for cellulitis Fluid: Not on IV fluid Consultants: Orthopedics Family Communication: None at bedside  Status is: Inpatient  Remains inpatient appropriate because: On IV antibiotics for cellulitis.  Placement delayed because of COVID diagnosis.  Dispo: The patient is from: Home              Anticipated d/c is to: SNF after 10 days of COVID test.              Patient currently is not medically stable to d/c.   Difficult to place patient No     Infusions:   cefTRIAXone (ROCEPHIN)  IV 1 g (12/14/20 0900)     Scheduled Meds:  amantadine  100 mg Oral TID   baclofen  10 mg Oral  Q lunch   baclofen  20 mg Oral BID AC & HS   Chlorhexidine Gluconate Cloth  6 each Topical Daily   cholecalciferol  1,000 Units Oral Daily   docusate sodium  100 mg Oral BID   enoxaparin (LOVENOX) injection  40 mg Subcutaneous Q24H   feeding supplement  237 mL Oral Q24H   interferon beta-1a  44 mcg Subcutaneous Q M,W,F   levETIRAcetam  500 mg Oral BID   lipase/protease/amylase  36,000 Units Oral TID WC   loratadine  10 mg Oral Daily   multivitamin with minerals  1 tablet Oral Daily   nirmatrelvir/ritonavir EUA  3 tablet Oral BID   oxybutynin  10 mg Oral BID   pantoprazole  40 mg Oral Daily  polyethylene glycol  17 g Oral Daily   tiZANidine  4 mg Oral Q0200    PRN meds: acetaminophen, bisacodyl, HYDROcodone-acetaminophen, HYDROcodone-acetaminophen, metoCLOPramide **OR** metoCLOPramide (REGLAN) injection, morphine injection, ondansetron **OR** ondansetron (ZOFRAN) IV, polyethylene glycol, sodium chloride flush   Antimicrobials: Anti-infectives (From admission, onward)    Start     Dose/Rate Route Frequency Ordered Stop   12/15/20 1115  nirmatrelvir/ritonavir EUA (PAXLOVID) 3 tablet        3 tablet Oral 2 times daily 12/15/20 1026 12/20/20 0959   12/14/20 0000  cefdinir (OMNICEF) 300 MG capsule        300 mg Oral 2 times daily 12/14/20 1551 12/19/20 2359   12/11/20 1015  cefTRIAXone (ROCEPHIN) 1 g in sodium chloride 0.9 % 100 mL IVPB        1 g 200 mL/hr over 30 Minutes Intravenous Every 24 hours 12/11/20 0922 12/18/20 1014   12/09/20 1700  ceFAZolin (ANCEF) IVPB 2g/100 mL premix        2 g 200 mL/hr over 30 Minutes Intravenous Every 8 hours 12/09/20 1120 12/10/20 0904   12/09/20 0942  vancomycin (VANCOCIN) powder  Status:  Discontinued          As needed 12/09/20 0942 12/09/20 1004       Objective: Vitals:   12/14/20 0755 12/14/20 2050  BP: 122/64 (!) 125/99  Pulse: 84 (!) 104  Resp: 19 16  Temp: 97.6 F (36.4 C) 99.3 F (37.4 C)  SpO2: 92% 93%    Intake/Output  Summary (Last 24 hours) at 12/15/2020 1028 Last data filed at 12/15/2020 0900 Gross per 24 hour  Intake 360 ml  Output 2000 ml  Net -1640 ml   Filed Weights   12/08/20 1628 12/09/20 0747  Weight: 97.1 kg 97.1 kg   Weight change:  Body mass index is 36.73 kg/m.   Physical Exam: General exam: Pleasant, middle-aged Caucasian female.  Not in physical distress Skin: No rashes, lesions or ulcers. HEENT: Atraumatic, normocephalic, no obvious bleeding Lungs: Clear to auscultation bilaterally CVS: Regular rate and rhythm, mild ejection systolic murmur GI/Abd soft, nontender, nondistended, bowel sound present CNS: Alert, awake, oriented x3 Psychiatry: Mood appropriate Extremities: Chronic lymphedema of both lower extremities.  Left leg cellulitis seems to be improving  Data Review: I have personally reviewed the laboratory data and studies available.  F/u labs ordered Unresulted Labs (From admission, onward)     Start     Ordered   12/16/20 0500  Creatinine, serum  (enoxaparin (LOVENOX)    CrCl >/= 30 ml/min)  Weekly,   R     Comments: while on enoxaparin therapy    12/09/20 1120   12/15/20 0754  C-reactive protein  Add-on,   AD       Question:  Specimen collection method  Answer:  IV Team=IV Team collect   12/15/20 0753   Unscheduled  CBC with Differential/Platelet  Tomorrow morning,   R       Question:  Specimen collection method  Answer:  IV Team=IV Team collect   12/15/20 1027   Unscheduled  Basic metabolic panel  Tomorrow morning,   R       Question:  Specimen collection method  Answer:  IV Team=IV Team collect   12/15/20 1027   Unscheduled  C-reactive protein  Tomorrow morning,   R       Question:  Specimen collection method  Answer:  IV Team=IV Team collect   12/15/20 1027  Signed, Terrilee Croak, MD [facility] 12/15/2020

## 2020-12-16 DIAGNOSIS — S82402A Unspecified fracture of shaft of left fibula, initial encounter for closed fracture: Secondary | ICD-10-CM | POA: Diagnosis not present

## 2020-12-16 DIAGNOSIS — S82202A Unspecified fracture of shaft of left tibia, initial encounter for closed fracture: Secondary | ICD-10-CM | POA: Diagnosis not present

## 2020-12-16 LAB — CBC WITH DIFFERENTIAL/PLATELET
Abs Immature Granulocytes: 0.07 10*3/uL (ref 0.00–0.07)
Basophils Absolute: 0 10*3/uL (ref 0.0–0.1)
Basophils Relative: 0 %
Eosinophils Absolute: 0.2 10*3/uL (ref 0.0–0.5)
Eosinophils Relative: 5 %
HCT: 24.9 % — ABNORMAL LOW (ref 36.0–46.0)
Hemoglobin: 7.9 g/dL — ABNORMAL LOW (ref 12.0–15.0)
Immature Granulocytes: 2 %
Lymphocytes Relative: 34 %
Lymphs Abs: 1.3 10*3/uL (ref 0.7–4.0)
MCH: 29.5 pg (ref 26.0–34.0)
MCHC: 31.7 g/dL (ref 30.0–36.0)
MCV: 92.9 fL (ref 80.0–100.0)
Monocytes Absolute: 0.3 10*3/uL (ref 0.1–1.0)
Monocytes Relative: 9 %
Neutro Abs: 1.9 10*3/uL (ref 1.7–7.7)
Neutrophils Relative %: 50 %
Platelets: 217 10*3/uL (ref 150–400)
RBC: 2.68 MIL/uL — ABNORMAL LOW (ref 3.87–5.11)
RDW: 13.5 % (ref 11.5–15.5)
WBC: 3.8 10*3/uL — ABNORMAL LOW (ref 4.0–10.5)
nRBC: 1.6 % — ABNORMAL HIGH (ref 0.0–0.2)

## 2020-12-16 LAB — C-REACTIVE PROTEIN: CRP: 2.2 mg/dL — ABNORMAL HIGH (ref <1.0)

## 2020-12-16 LAB — BASIC METABOLIC PANEL
Anion gap: 8 (ref 5–15)
BUN: 11 mg/dL (ref 6–20)
CO2: 29 mmol/L (ref 22–32)
Calcium: 8.6 mg/dL — ABNORMAL LOW (ref 8.9–10.3)
Chloride: 97 mmol/L — ABNORMAL LOW (ref 98–111)
Creatinine, Ser: 0.47 mg/dL (ref 0.44–1.00)
GFR, Estimated: >60 mL/min (ref >60)
Glucose, Bld: 118 mg/dL — ABNORMAL HIGH (ref 70–99)
Potassium: 3.6 mmol/L (ref 3.5–5.1)
Sodium: 134 mmol/L — ABNORMAL LOW (ref 135–145)

## 2020-12-16 MED ORDER — GUAIFENESIN ER 600 MG PO TB12
1200.0000 mg | ORAL_TABLET | Freq: Two times a day (BID) | ORAL | Status: DC
  Administered 2020-12-16 – 2020-12-18 (×5): 1200 mg via ORAL
  Filled 2020-12-16 (×5): qty 2

## 2020-12-16 NOTE — Progress Notes (Signed)
PROGRESS NOTE  Rebecca [name]  DOB: 1960/09/20  PCP: Delsa Grana, PA-C ZES:[id number]  DOA: 12/08/2020  LOS: 8 days  Hospital Day: 9  Chief complaint: Fall   Brief narrative: Rebecca [name] is a 60 y.o. 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 On Saturday 10/30, patient got up from her potty chair and attempted to move to the lift chair with a walker.  In the process, her left leg got stuck on the carpet because it twisted awkwardly and felt a snap on her leg.  EMS came and lifted her back into the lift chair versus declined across port to ED at that time.  She was unable to get back up since then and hence he called EMS again 11/31.  She could not stand at all and hence agreed to be brought to the ED at [facility].  Imagings of left knee showed severely displaced and comminuted proximal left tibial fracture which appears to extend to the articular surface of the lateral tibial plateau.  Also noted to have moderately displaced and comminuted proximal left fibula fracture.   Because of the complexity of her fracture, patient was transferred to [facility] Admitted to hospitalist service. On 11/2, patient underwent ORIF of left tibial plateau fracture by Dr. Doreatha Martin   Subjective: Patient was seen and examined this morning.  Propped up in bed.  Not in distress.  Not on supplemental oxygen.  States that she is able to bring up phlegm.  Denies shortness of breath or any new symptoms.  Assessment/Plan: #COVID infection -On 11/7, patient complained of cough, sore throat. -Chest x-ray showed possible bronchitis without any consolidation or collapse.  COVID test was positive. -WBC count normal.  CRP only slightly  elevated. -Because of her immunocompromise status and high risk of progression, I started her on a 5-day course of Paxlovid.  -Discussed the risk and benefit with patient. -Robitussin scheduled and PRN for cough. Recent Labs  Lab 12/10/20 0501 12/11/20 0332 12/12/20 0500 12/14/20 1401 12/14/20 1420 12/15/20 1102 12/16/20 0300  SARSCOV2NAA  --   --   --  POSITIVE*  --   --   --   WBC 6.2 5.8 5.6  --  4.1  --  3.8*  CRP  --   --   --   --   --  2.7* 2.2*  ALT 235* 129* 142*  --  111*  --   --     Left tibia/left fibula fracture -Sustained fracture after mechanical fall. -11/2, underwent ORIF of left tibial fracture. -DVT prophylaxis and pain per orthopedics team. -PT eval obtained.  SNF recommended.  Left leg cellulitis Chronic lymphedema of bilateral lower extremities -On 11/4, she was noted to have acute cellulitis of left leg with redness, tenderness and increased swelling.  Wound care consult was obtained.  Patient was started on IV Rocephin.  It seems to be clinically improving in terms of redness, swelling and pain.  No fever.  WBC count stable.  DVT scan negative.  -Continue IV Rocephin for a 5-day course. Recent Labs  Lab 12/10/20 0501 12/11/20 0332 12/12/20 0500 12/14/20 1420 12/16/20 0300  WBC 6.2 5.8 5.6 4.1 3.8*   Hepatic steatosis  -Elevated liver enzymes noted  on 11/3.  Right upper quadrant ultrasound showed diffuse hepatic steatosis.  Hepatitis panel negative.  Liver enzymes improved at first but gradually worsening again without any clinical symptoms. -Lipid panel 11/4 with LDL at 77, HDL at 48. Recent Labs  Lab 12/10/20 0501 12/11/20 0332 12/12/20 0500 12/14/20 1420  AST 175* 70* 85* 57*  ALT 235* 129* 142* 111*  ALKPHOS 161* 141* 167* 187*  BILITOT 0.7 0.9 1.1 0.4  PROT 5.7* 5.9* 6.1* 6.3*  ALBUMIN 2.8* 2.8* 2.7* 2.7*    Acute blood loss anemia -Hemoglobin seems to be normal at baseline.  Postoperatively, hemoglobin has been running low and  stable between 7 and 8.  No active evidence of bleeding anywhere else. -Avoiding heparin products.  Currently on SCD for DVT prophylaxis. Recent Labs    12/10/20 0501 12/11/20 0332 12/12/20 0500 12/14/20 1420 12/16/20 0300  HGB 7.4* 7.3* 7.8* 7.9* 7.9*  MCV 89.8 89.7 89.6 90.6 92.9    Multiple sclerosis -She is minimally ambulatory at baseline -Continue amantadine, Baclofen, Zanaflex -She is on Rebif 3x/week.  Continue the same   Seizure d/o -Continue Keppra   Neurogenic bladder -Has a chronic indwelling foley -Continue Ditropan.  Not on chronic antibiotics.   Obesity -Body mass index is 36.73 kg/m. Patient has been advised to make an attempt to improve diet and exercise patterns to aid in weight loss.   Mobility: Encourage ambulation.  PT eval appreciated Living condition: Lives at home Goals of care:   Code Status: Full Code  Nutritional status: Body mass index is 36.73 kg/m. Nutrition Problem: Increased nutrient needs Etiology: post-op healing Signs/Symptoms: estimated needs Diet:  Diet Order             Diet general           Diet Heart Room service appropriate? Yes; Fluid consistency: Thin  Diet effective now                  DVT prophylaxis:  enoxaparin (LOVENOX) injection 40 mg Start: 12/11/20 1000 SCDs Start: 12/09/20 1120 SCDs Start: 12/08/20 1809   Antimicrobials:.  IV Rocephin for cellulitis Fluid: not on IV fluid Consultants: Orthopedics Family Communication: None at bedside  Status is: Inpatient  Remains inpatient appropriate because: On IV antibiotics for cellulitis.  Placement delayed because of COVID diagnosis.  Dispo: The patient is from: Home              Anticipated d/c is to: SNF after 10 days of COVID test.  Tentatively on 11/17.              Patient currently is not medically stable to d/c.   Difficult to place patient No     Infusions:   cefTRIAXone (ROCEPHIN)  IV 1 g (12/16/20 0855)     Scheduled Meds:  amantadine   100 mg Oral TID   baclofen  10 mg Oral Q lunch   baclofen  20 mg Oral BID AC & HS   Chlorhexidine Gluconate Cloth  6 each Topical Daily   cholecalciferol  1,000 Units Oral Daily   docusate sodium  100 mg Oral BID   enoxaparin (LOVENOX) injection  40 mg Subcutaneous Q24H   feeding supplement  237 mL Oral Q24H   guaiFENesin  1,200 mg Oral BID   interferon beta-1a  44 mcg Subcutaneous Q M,W,F   levETIRAcetam  500 mg Oral BID   lipase/protease/amylase  36,000 Units Oral TID WC   loratadine  10 mg Oral Daily  multivitamin with minerals  1 tablet Oral Daily   nirmatrelvir/ritonavir EUA  3 tablet Oral BID   oxybutynin  10 mg Oral BID   pantoprazole  40 mg Oral Daily   polyethylene glycol  17 g Oral Daily   tiZANidine  4 mg Oral Q0200    PRN meds: acetaminophen, bisacodyl, guaiFENesin-dextromethorphan, HYDROcodone-acetaminophen, HYDROcodone-acetaminophen, metoCLOPramide **OR** metoCLOPramide (REGLAN) injection, morphine injection, ondansetron **OR** ondansetron (ZOFRAN) IV, polyethylene glycol, sodium chloride flush   Antimicrobials: Anti-infectives (From admission, onward)    Start     Dose/Rate Route Frequency Ordered Stop   12/15/20 1200  nirmatrelvir/ritonavir EUA (PAXLOVID) 3 tablet        3 tablet Oral 2 times daily 12/15/20 1026 12/20/20 0959   12/14/20 0000  cefdinir (OMNICEF) 300 MG capsule        300 mg Oral 2 times daily 12/14/20 1551 12/19/20 2359   12/11/20 1015  cefTRIAXone (ROCEPHIN) 1 g in sodium chloride 0.9 % 100 mL IVPB        1 g 200 mL/hr over 30 Minutes Intravenous Every 24 hours 12/11/20 0922 12/18/20 1014   12/09/20 1700  ceFAZolin (ANCEF) IVPB 2g/100 mL premix        2 g 200 mL/hr over 30 Minutes Intravenous Every 8 hours 12/09/20 1120 12/10/20 0904   12/09/20 0942  vancomycin (VANCOCIN) powder  Status:  Discontinued          As needed 12/09/20 0942 12/09/20 1004       Objective: Vitals:   12/15/20 2018 12/16/20 0900  BP: 111/63 114/61  Pulse: 84 74   Resp: 17 18  Temp: 97.6 F (36.4 C) 97.9 F (36.6 C)  SpO2: 97% 96%    Intake/Output Summary (Last 24 hours) at 12/16/2020 0948 Last data filed at 12/16/2020 0900 Gross per 24 hour  Intake 540 ml  Output 750 ml  Net -210 ml    Filed Weights   12/08/20 1628 12/09/20 0747  Weight: 97.1 kg 97.1 kg   Weight change:  Body mass index is 36.73 kg/m.   Physical Exam: General exam: Pleasant, middle-aged Caucasian female.  Not in physical distress Skin: No rashes, lesions or ulcers. HEENT: Atraumatic, normocephalic, no obvious bleeding Lungs: Clear to auscultate bilaterally CVS: Regular rate and rhythm, mild ejection systolic murmur GI/Abd soft, nontender, nondistended, bowel sound present CNS: Alert, awake, oriented x3 Psychiatry: Mood appropriate Extremities: Chronic lymphedema of both lower extremities.  Left leg cellulitis seems to be improving  Data Review: I have personally reviewed the laboratory data and studies available.  F/u labs ordered Unresulted Labs (From admission, onward)     Start     Ordered   12/16/20 0500  Creatinine, serum  (enoxaparin (LOVENOX)    CrCl >/= 30 ml/min)  Weekly,   R     Comments: while on enoxaparin therapy    12/09/20 1120            Signed, Rebecca Croak, MD [facility] 12/16/2020

## 2020-12-16 NOTE — Progress Notes (Signed)
Nutrition Follow-up  DOCUMENTATION CODES:   Obesity unspecified  INTERVENTION:  Continue Ensure daily Continue MVI with minerals daily  Recommend initiation of bowel regimen as no BM documented since 10/29  NUTRITION DIAGNOSIS:   Increased nutrient needs related to post-op healing as evidenced by estimated needs.  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  Pt is s/p ORIF of L tibial plateau fx (11/2)  Pt pending discharge to SNF after 10 days of negative COVID test, tentatively 11/17  Appetite/PO intake remains adequate with 0-100% meal completion x 7 recorded meals (67% avg meal intake). Pt doing fairly well with supplement per RN. Continue current nutrition plan of care.   UOP: 2390ml x24 hours I/O: -8868ml since admit  Recommend initiation of bowel regimen as no BM documented since 10/29  Medications:   amantadine  100 mg Oral TID   baclofen  10 mg Oral Q lunch   baclofen  20 mg Oral BID AC & HS   Chlorhexidine Gluconate Cloth  6 each Topical Daily   cholecalciferol  1,000 Units Oral Daily   docusate sodium  100 mg Oral BID   enoxaparin (LOVENOX) injection  40 mg Subcutaneous Q24H   feeding supplement  237 mL Oral Q24H   guaiFENesin  1,200 mg Oral BID   interferon beta-1a  44 mcg Subcutaneous Q M,W,F   levETIRAcetam  500 mg Oral BID   lipase/protease/amylase  36,000 Units Oral TID WC   loratadine  10 mg Oral Daily   multivitamin with  minerals  1 tablet Oral Daily   nirmatrelvir/ritonavir EUA  3 tablet Oral BID   oxybutynin  10 mg Oral BID   pantoprazole  40 mg Oral Daily   polyethylene glycol  17 g Oral Daily   tiZANidine  4 mg Oral Q0200   Labs: Recent Labs  Lab 12/12/20 0500 12/14/20 1420 12/16/20 0300  NA 133* 134* 134*  K 3.7 3.7 3.6  CL 99 97* 97*  CO2 27 28 29   BUN 8 13 11   CREATININE 0.47 0.44 0.47  CALCIUM 8.6* 8.4* 8.6*  GLUCOSE 131* 133* 118*    Diet Order:   Diet Order             Diet general           Diet Heart Room service appropriate? Yes; Fluid consistency: Thin  Diet effective now                   EDUCATION NEEDS:   Education needs have been addressed  Skin:  Skin Assessment: Skin Integrity Issues: Skin Integrity Issues:: Incisions Incisions: L leg, closed  Last BM:  10/29  Height:   Ht Readings from Last 1 Encounters:  12/09/20 5\' 4"  (1.626 m)    Weight:   Wt Readings from Last 1 Encounters:  12/09/20 97.1 kg     BMI:  Body mass index is 36.73 kg/m.  Estimated Nutritional Needs:  Kcal:  1800-2000  Protein:  110-125 grams  Fluid:  >1.8 L   Larkin Ina, MS, RD, LDN (she/her/hers) RD pager number and weekend/on-call pager number located in [place].

## 2020-12-16 NOTE — Progress Notes (Signed)
Occupational Therapy Treatment Patient Details Name: Rebecca [name] MRN: [id number] DOB: 01-17-1961 Today's Date: 12/16/2020   History of present illness 60 yo female presents to [facility] on 10/31 with fall and LLE pain, sustained L comminuted proximal tibia/tibial plateau fracture. s/p ORIF L tibial plateau fx on 11/2. Tested positive for COVID 11/7 and is on precautions. PMH includes endocarditis with resultant mild AS, breast cancer (2015), MS, IBS, ACDF 2013, and seizures.   OT comments  Upon entry pt supine in bed, OT focused on ADLs and sitting balance during session. Pt requiring increased assistance for bed mobility, loses balance posteriorly x2 when moving to EOB. Pt displays L lean and holds bedrail/rests on L elbow/forearm throughout session for trunk support. Pt presenting with increased dizziness after seated ADL, returned to supine, VSS, nurse notified. Pt adhering to UE HEP, demonstrates exercises. Pt limited by decreased balance, strength, and activity tolerance at this time. Will continue to follow acutely to address impairments listed. Continue to recommend SNF at d/c.   Recommendations for follow up therapy are one component of a multi-disciplinary discharge planning process, led by the attending physician.  Recommendations may be updated based on patient status, additional functional criteria and insurance authorization.    Follow Up Recommendations  Skilled nursing-short term rehab (<3 hours/day)    Assistance Recommended at Discharge Frequent or constant Supervision/Assistance  Equipment Recommendations  Other (comment) Harrel Lemon lift)    Recommendations for Other Services      Precautions / Restrictions Precautions Precautions: Fall Precaution Comments: impaired R LE tone, COVID precautions Restrictions LLE Weight Bearing: Touchdown weight bearing       Mobility Bed Mobility Overal bed mobility: Needs Assistance Bed Mobility: Rolling;Sit to Supine;Supine to  Sit Rolling: Min assist   Supine to sit: Mod assist;Max assist Sit to supine: Max assist   General bed mobility comments: Pt mostly requiring assistance to move BLE off bed to floor. Able to pull upper body into upright posture using bedrails to pull up.    Transfers Overall transfer level: Needs assistance                       Balance Overall balance assessment: Needs assistance;History of Falls Sitting-balance support: Feet supported;Single extremity supported Sitting balance-Leahy Scale: Poor Sitting balance - Comments: required constant single extremity support, L side lean thorughout session Postural control: Left lateral lean;Posterior lean;Other (comment) (Placed pillows on L side for support while seated EOB)                                 ADL either performed or assessed with clinical judgement   ADL Overall ADL's : Needs assistance/impaired Eating/Feeding: Set up;Sitting   Grooming: Minimal assistance;Sitting Grooming Details (indicate cue type and reason): brushed teeth sitting supported at EOB Upper Body Bathing: Moderate assistance;Sitting   Lower Body Bathing: Total assistance;Bed level;+2 for physical assistance;+2 for safety/equipment   Upper Body Dressing : Moderate assistance;Sitting   Lower Body Dressing: Total assistance;+2 for physical assistance;Sitting/lateral leans               Functional mobility during ADLs: Minimal assistance;Moderate assistance General ADL Comments: requires increased time and assistance to maintain sitting balance while completing seated ADL. Pt reported increased dizziness after seated grooming task, VSS, nurse notified    Extremity/Trunk Assessment Upper Extremity Assessment Upper Extremity Assessment: Overall WFL for tasks assessed   Lower Extremity Assessment Lower Extremity Assessment:  Defer to PT evaluation        Vision   Vision Assessment?: No apparent visual deficits   Perception  Perception Perception: Within Functional Limits   Praxis Praxis Praxis: Not tested    Cognition Arousal/Alertness: Awake/alert Behavior During Therapy: WFL for tasks assessed/performed Overall Cognitive Status: Within Functional Limits for tasks assessed                                            Exercises Exercises: General Upper Extremity (reiterated shoulder flexion, horizontal abduction/adduction, elbow flexion/extension exercises given during previous session. Pt demonstrated and states she has been adhering to HEP)   Shoulder Instructions       General Comments pt feels more comfortable with +2 support during therapy for safety and ease of mobility    Pertinent Vitals/ Pain       Pain Assessment: Faces Pain Score: 3  Faces Pain Scale: Hurts little more Pain Location: LLE Pain Descriptors / Indicators: Discomfort;Constant Pain Intervention(s): Limited activity within patient's tolerance;Monitored during session  Home Living                                          Prior Functioning/Environment              Frequency  Min 2X/week        Progress Toward Goals  OT Goals(current goals can now be found in the care plan section)  Progress towards OT goals: Progressing toward goals  Acute Rehab OT Goals Patient Stated Goal: return to PLOF OT Goal Formulation: With patient Time For Goal Achievement: 12/25/20 Potential to Achieve Goals: Fair ADL Goals Pt Will Perform Grooming: with modified independence;sitting Pt/caregiver will Perform Home Exercise Program: Increased strength;Both right and left upper extremity;With theraband;Independently;With written HEP provided Additional ADL Goal #1: Pt to complete bed mobility at Min A in prep for ADL transfers Additional ADL Goal #2: Pt to demo ability to maintain sitting balance EOB during dynamic functional tasks with no more than mim guard to maintain balance Additional ADL Goal  #3: Pt to demo sit to stand transfer at Mod A x 2 in prep for ADL transfers  Plan Discharge plan remains appropriate;Frequency remains appropriate    Co-evaluation          OT goals addressed during session: ADL's and self-care;Strengthening/ROM      AM-PAC OT "6 Clicks" Daily Activity     Outcome Measure   Help from another person eating meals?: None Help from another person taking care of personal grooming?: A Little Help from another person toileting, which includes using toliet, bedpan, or urinal?: Total Help from another person bathing (including washing, rinsing, drying)?: Total Help from another person to put on and taking off regular upper body clothing?: A Lot Help from another person to put on and taking off regular lower body clothing?: Total 6 Click Score: 12    End of Session    OT Visit Diagnosis: Unsteadiness on feet (R26.81);Other abnormalities of gait and mobility (R26.89);Muscle weakness (generalized) (M62.81)   Activity Tolerance Patient limited by fatigue;Other (comment) (limited by dizziness)   Patient Left in bed;with call bell/phone within reach;with bed alarm set   Nurse Communication Mobility status;Other (comment) (pt presented with increased dizziness after seated ADL, nurse notified)  Time: 9458-5929 OT Time Calculation (min): 44 min  Charges: OT General Charges $OT Visit: 1 Visit OT Treatments $Self Care/Home Management : 38-52 mins  Lynnda Child, OTD, OTR/L Acute Rehab [phone]) [phone] - [place] 12/16/2020, 3:28 PM

## 2020-12-16 NOTE — Progress Notes (Signed)
Physical Therapy Treatment Patient Details Name: Rebecca [name] MRN: [id number] DOB: February 25, 1960 Today's Date: 12/16/2020   History of Present Illness 60 yo female presents to [facility] on 10/31 with fall and LLE pain, sustained L comminuted proximal tibia/tibial plateau fracture. s/p ORIF L tibial plateau fx on 11/2. Tested positive for COVID 11/7 and is on precautions. PMH includes endocarditis with resultant mild AS, breast cancer (2015), MS, IBS, ACDF 2013, and seizures.    PT Comments    Pt admitted with above diagnosis. Pt was able to sit EOB 15 min with min guard to min assist with UE support most of time for support.  Pt working trunk especially when doing LE exercises in sitting.  Pt progressing slowly.  Pt currently with functional limitations due to balance and endurance deficits. Pt will benefit from skilled PT to increase their independence and safety with mobility to allow discharge to the venue listed below.      Recommendations for follow up therapy are one component of a multi-disciplinary discharge planning process, led by the attending physician.  Recommendations may be updated based on patient status, additional functional criteria and insurance authorization.  Follow Up Recommendations  Skilled nursing-short term rehab (<3 hours/day)     Assistance Recommended at Discharge Frequent or constant Supervision/Assistance  Equipment Recommendations  Wheelchair cushion (measurements PT);Wheelchair (measurements PT)    Recommendations for Other Services       Precautions / Restrictions Precautions Precautions: Fall Precaution Comments: impaired R LE tone, COVID precautions Restrictions LLE Weight Bearing: Touchdown weight bearing Other Position/Activity Restrictions: RLE has extension tone with efforts to stand     Mobility  Bed Mobility Overal bed mobility: Needs Assistance Bed Mobility: Supine to Sit;Sit to Supine     Supine to sit: Max assist;+2 for safety/equipment;HOB  elevated;Mod assist Sit to supine: Max assist;+2 for physical assistance   General bed mobility comments: Pt needed mod to max assist to come to EOB with pt doing a little more of the work today than last visit but still needs assist for LEs and trunk. Incr assist to lie down and max assist to move pt up in bed.    Transfers                   General transfer comment: N/A until pt can incr weight bearing. Use lift    Ambulation/Gait               General Gait Details: unable   Stairs             Wheelchair Mobility    Modified Rankin (Stroke Patients Only)       Balance Overall balance assessment: Needs assistance;History of Falls Sitting-balance support: Bilateral upper extremity supported;Single extremity supported (right foot supported) Sitting balance-Leahy Scale: Poor Sitting balance - Comments: required intermittent back support throughout session, pt used bedrail/mattress for support during session.  Sat 15 min and brushed hair. Also did some LE exercises. Postural control: Posterior lean                                  Cognition Arousal/Alertness: Awake/alert Behavior During Therapy: WFL for tasks assessed/performed Overall Cognitive Status: Within Functional Limits for tasks assessed  Exercises General Exercises - Lower Extremity Ankle Circles/Pumps: AROM;Both;Supine;10 reps Quad Sets: AROM;Both;Supine;10 reps Gluteal Sets: AROM;Both;5 reps;Supine Long Arc Quad: AROM;Both;15 reps;Seated Heel Slides: AAROM;Both;Supine;10 reps Hip ABduction/ADduction: AAROM;Both;10 reps;Supine Straight Leg Raises: AAROM;Both;10 reps;Supine    General Comments        Pertinent Vitals/Pain Pain Assessment: Faces Faces Pain Scale: Hurts little more Breathing: normal Negative Vocalization: none Facial Expression: smiling or inexpressive Body Language: relaxed Pain Location:  LLE Pain Descriptors / Indicators: Discomfort;Guarding Pain Intervention(s): Limited activity within patient's tolerance;Monitored during session;Repositioned    Home Living                          Prior Function            PT Goals (current goals can now be found in the care plan section) Acute Rehab PT Goals Patient Stated Goal: return to PLOF Progress towards PT goals: Progressing toward goals    Frequency    Min 3X/week      PT Plan Current plan remains appropriate    Co-evaluation              AM-PAC PT "6 Clicks" Mobility   Outcome Measure  Help needed turning from your back to your side while in a flat bed without using bedrails?: A Lot Help needed moving from lying on your back to sitting on the side of a flat bed without using bedrails?: A Lot Help needed moving to and from a bed to a chair (including a wheelchair)?: Total Help needed standing up from a chair using your arms (e.g., wheelchair or bedside chair)?: Total Help needed to walk in hospital room?: Total Help needed climbing 3-5 steps with a railing? : Total 6 Click Score: 8    End of Session Equipment Utilized During Treatment: Gait belt Activity Tolerance: Patient limited by fatigue Patient left: with call bell/phone within reach;in bed;with bed alarm set Nurse Communication: Mobility status;Need for lift equipment PT Visit Diagnosis: Other abnormalities of gait and mobility (R26.89);Muscle weakness (generalized) (M62.81);Pain Pain - Right/Left: Left Pain - part of body: Leg     Time: 0940-7680 PT Time Calculation (min) (ACUTE ONLY): 24 min  Charges:  $Therapeutic Exercise: 8-22 mins $Therapeutic Activity: 8-22 mins                     Rebecca [name] M,PT Acute Rehab Services [phone] (pager)    Alvira Philips 12/16/2020, 11:26 AM

## 2020-12-17 DIAGNOSIS — S82202A Unspecified fracture of shaft of left tibia, initial encounter for closed fracture: Secondary | ICD-10-CM | POA: Diagnosis not present

## 2020-12-17 DIAGNOSIS — S82402A Unspecified fracture of shaft of left fibula, initial encounter for closed fracture: Secondary | ICD-10-CM | POA: Diagnosis not present

## 2020-12-17 LAB — GLUCOSE, CAPILLARY: Glucose-Capillary: 108 mg/dL — ABNORMAL HIGH (ref 70–99)

## 2020-12-17 NOTE — Progress Notes (Signed)
PROGRESS NOTE  Rebecca [name]  DOB: 04-20-1960  PCP: Delsa Grana, PA-C VOH:[id number]  DOA: 12/08/2020  LOS: 9 days  Hospital Day: 10  Chief complaint: Fall   Brief narrative: Rebecca [name] is a 60 y.o. 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 On Saturday 10/30, patient got up from her potty chair and attempted to move to the lift chair with a walker.  In the process, her left leg got stuck on the carpet because it twisted awkwardly and felt a snap on her leg.  EMS came and lifted her back into the lift chair versus declined across port to ED at that time.  She was unable to get back up since then and hence he called EMS again 11/31.  She could not stand at all and hence agreed to be brought to the ED at [facility].  Imagings of left knee showed severely displaced and comminuted proximal left tibial fracture which appears to extend to the articular surface of the lateral tibial plateau.  Also noted to have moderately displaced and comminuted proximal left fibula fracture.   Because of the complexity of her fracture, patient was transferred to [facility] Admitted to hospitalist service. On 11/2, patient underwent ORIF of left tibial plateau fracture by Dr. Doreatha Martin   Subjective: Patient was seen and examined this morning.  Propped up in bed.  Not in distress.  Not on supplemental oxygen.  Able to bring up phlegm.   Clinically no change since yesterday.  Assessment/Plan: #COVID infection  -While in the hospital, on 11/7, patient complained of cough, sore throat. -Chest x-ray showed possible bronchitis without any consolidation or collapse.  COVID test was positive. -WBC count normal.  CRP was only slightly  elevated. -Because of her immunocompromised status and high risk of progression, she is on a 5-day course of Paxlovid till 11/13. -Discussed the risk and benefit with patient. -Robitussin scheduled and PRN for cough. -Repeat labs tomorrow Recent Labs  Lab 12/11/20 0332 12/12/20 0500 12/14/20 1401 12/14/20 1420 12/15/20 1102 12/16/20 0300  SARSCOV2NAA  --   --  POSITIVE*  --   --   --   WBC 5.8 5.6  --  4.1  --  3.8*  CRP  --   --   --   --  2.7* 2.2*  ALT 129* 142*  --  111*  --   --    Left tibia/left fibula fracture -Sustained fracture after mechanical fall. -11/2, underwent ORIF of left tibial fracture. -DVT prophylaxis and pain per orthopedics team. -PT eval obtained.  SNF recommended.  Left leg cellulitis Chronic lymphedema of bilateral lower extremities -On 11/4, she was noted to have acute cellulitis of left leg with redness, tenderness and increased swelling.  Wound care consult was obtained.  Patient was started on IV Rocephin.  It seems to be clinically improving in terms of redness, swelling and pain.  No fever.  WBC count stable.  DVT scan negative.  -Currently on a 5-day course of oral Omnicef till 11/12. Recent Labs  Lab 12/11/20 0332 12/12/20 0500 12/14/20 1420 12/16/20 0300  WBC 5.8 5.6 4.1 3.8*  Hepatic steatosis  -Elevated liver enzymes noted on 11/3.  Right upper quadrant ultrasound showed diffuse  hepatic steatosis.  Hepatitis panel negative.  Liver enzymes slightly elevated but no clinical symptoms.  Repeat labs tomorrow. -Lipid panel 11/4 with LDL at 77, HDL at 48. Recent Labs  Lab 12/11/20 0332 12/12/20 0500 12/14/20 1420  AST 70* 85* 57*  ALT 129* 142* 111*  ALKPHOS 141* 167* 187*  BILITOT 0.9 1.1 0.4  PROT 5.9* 6.1* 6.3*  ALBUMIN 2.8* 2.7* 2.7*   Acute blood loss anemia -Hemoglobin seems to be normal at baseline.  Postoperatively, hemoglobin has been running low and stable between 7 and 8.  No active evidence of bleeding anywhere  else. -Avoiding heparin products.  Currently on Lovenox and SCD for DVT prophylaxis. Recent Labs    12/10/20 0501 12/11/20 0332 12/12/20 0500 12/14/20 1420 12/16/20 0300  HGB 7.4* 7.3* 7.8* 7.9* 7.9*  MCV 89.8 89.7 89.6 90.6 92.9   Multiple sclerosis -She is minimally ambulatory at baseline -Continue amantadine, Baclofen, Zanaflex -She is on Rebif 3x/week.  Continue the same   Seizure d/o -Continue Keppra   Neurogenic bladder -Has a chronic indwelling foley -Continue Ditropan.  Not on chronic antibiotics.   Obesity -Body mass index is 39.39 kg/m. Patient has been advised to make an attempt to improve diet and exercise patterns to aid in weight loss.   Mobility: Encourage ambulation. PT eval appreciated Living condition: Lives at home Goals of care:   Code Status: Full Code  Nutritional status: Body mass index is 39.39 kg/m. Nutrition Problem: Increased nutrient needs Etiology: post-op healing Signs/Symptoms: estimated needs Diet:  Diet Order             Diet general           Diet Heart Room service appropriate? Yes; Fluid consistency: Thin  Diet effective now                  DVT prophylaxis:  enoxaparin (LOVENOX) injection 40 mg Start: 12/11/20 1000 SCDs Start: 12/09/20 1120 SCDs Start: 12/08/20 1809   Antimicrobials:.  Oral Omnicef, Paxlovid Fluid: not on IV fluid Consultants: Orthopedics Family Communication: None at bedside  Status is: Inpatient  Remains inpatient appropriate because: Pending 10-day waiting period for COVID discharge  Dispo: The patient is from: Home              Anticipated d/c is to: SNF after 10 days of COVID test.  Tentatively on 11/17.Marland Kitchen  Patient was offered different facility which could take COVID patients but patient and family has not agreed to that.              Patient currently is not medically stable to d/c.   Difficult to place patient No     Infusions:      Scheduled Meds:  amantadine  100 mg Oral TID    baclofen  10 mg Oral Q lunch   baclofen  20 mg Oral BID AC & HS   Chlorhexidine Gluconate Cloth  6 each Topical Daily   cholecalciferol  1,000 Units Oral Daily   docusate sodium  100 mg Oral BID   enoxaparin (LOVENOX) injection  40 mg Subcutaneous Q24H   feeding supplement  237 mL Oral Q24H   guaiFENesin  1,200 mg Oral BID   interferon beta-1a  44 mcg Subcutaneous Q M,W,F   levETIRAcetam  500 mg Oral BID   lipase/protease/amylase  36,000 Units Oral TID WC   loratadine  10 mg Oral Daily   multivitamin with minerals  1 tablet Oral Daily   nirmatrelvir/ritonavir EUA  3  tablet Oral BID   oxybutynin  10 mg Oral BID   pantoprazole  40 mg Oral Daily   polyethylene glycol  17 g Oral Daily   tiZANidine  4 mg Oral Q0200    PRN meds: acetaminophen, bisacodyl, guaiFENesin-dextromethorphan, HYDROcodone-acetaminophen, HYDROcodone-acetaminophen, metoCLOPramide **OR** metoCLOPramide (REGLAN) injection, morphine injection, ondansetron **OR** ondansetron (ZOFRAN) IV, polyethylene glycol, sodium chloride flush   Antimicrobials: Anti-infectives (From admission, onward)    Start     Dose/Rate Route Frequency Ordered Stop   12/15/20 1200  nirmatrelvir/ritonavir EUA (PAXLOVID) 3 tablet        3 tablet Oral 2 times daily 12/15/20 1026 12/20/20 0959   12/14/20 0000  cefdinir (OMNICEF) 300 MG capsule        300 mg Oral 2 times daily 12/14/20 1551 12/19/20 2359   12/11/20 1015  cefTRIAXone (ROCEPHIN) 1 g in sodium chloride 0.9 % 100 mL IVPB        1 g 200 mL/hr over 30 Minutes Intravenous Every 24 hours 12/11/20 0922 12/17/20 1022   12/09/20 1700  ceFAZolin (ANCEF) IVPB 2g/100 mL premix        2 g 200 mL/hr over 30 Minutes Intravenous Every 8 hours 12/09/20 1120 12/10/20 0904   12/09/20 0942  vancomycin (VANCOCIN) powder  Status:  Discontinued          As needed 12/09/20 0942 12/09/20 1004       Objective: Vitals:   12/16/20 2000 12/17/20 0811  BP: (!) 153/86 125/62  Pulse: 93 86  Resp: 18 16   Temp: 98.4 F (36.9 C) 98.4 F (36.9 C)  SpO2: 98% 96%    Intake/Output Summary (Last 24 hours) at 12/17/2020 1028 Last data filed at 12/17/2020 0600 Gross per 24 hour  Intake 240 ml  Output 1750 ml  Net -1510 ml   Filed Weights   12/08/20 1628 12/09/20 0747 12/17/20 0500  Weight: 97.1 kg 97.1 kg 104.1 kg   Weight change:  Body mass index is 39.39 kg/m.   Physical Exam: General exam: Pleasant, middle-aged Caucasian female.  Not in physical distress Skin: No rashes, lesions or ulcers. HEENT: Atraumatic, normocephalic, no obvious bleeding Lungs: Clear to auscultation bilaterally CVS: Regular rate and rhythm, mild ejection systolic murmur GI/Abd soft, nontender, nondistended, bowel sound present CNS: Alert, awake, oriented x3 Psychiatry: Mood appropriate Extremities: Chronic lymphedema of both lower extremities.  Left leg cellulitis much improved.  Surgical site on the lateral aspect of the left knee seems intact.  Data Review: I have personally reviewed the laboratory data and studies available.  F/u labs ordered Unresulted Labs (From admission, onward)     Start     Ordered   12/18/20 0500  CBC with Differential/Platelet  Tomorrow morning,   R       Question:  Specimen collection method  Answer:  IV Team=IV Team collect   12/17/20 1028   12/18/20 0500  Comprehensive metabolic panel  Tomorrow morning,   R       Question:  Specimen collection method  Answer:  IV Team=IV Team collect   12/17/20 1028   12/16/20 0500  Creatinine, serum  (enoxaparin (LOVENOX)    CrCl >/= 30 ml/min)  Weekly,   R     Comments: while on enoxaparin therapy    12/09/20 1120            Signed, Terrilee Croak, MD [facility] 12/17/2020

## 2020-12-17 NOTE — TOC Progression Note (Signed)
Transition of Care [facility]) - Initial/Assessment Note    Patient Details  Name: Rebecca [name] MRN: [id number] Date of Birth: [birth year]  Transition of Care [facility]) CM/SW Contact:    Milinda Antis, [place] Phone Number: 12/17/2020, 3:09 PM  Clinical Narrative:                 Insurance authorization has been approved for the patient to admit to [facility].   Authorization is approved from 11/9-11/11 and he plan auth number is [id number].  Attending has been notified and CSW will contact the family with further information once the discharge order and summary become available.   Patient Goals and CMS Choice        Expected Discharge Plan and Services Expected Discharge Plan: [place]         Expected Discharge Date: 12/14/20                                    Prior Living Arrangements/Services                       Activities of Daily Living Home Assistive Devices/Equipment: Bedside commode/3-in-1, Electric scooter, Environmental consultant (specify type) ADL Screening (condition at time of admission) Patient's cognitive ability adequate to safely complete daily activities?: Yes Is the patient deaf or have difficulty hearing?: No Does the patient have difficulty seeing, even when wearing glasses/contacts?: No Does the patient have difficulty concentrating, remembering, or making decisions?: No Patient able to express need for assistance with ADLs?: Yes Does the patient have difficulty dressing or bathing?: No Independently performs ADLs?: Yes (appropriate for developmental age) Does the patient have difficulty walking or climbing stairs?: Yes Weakness of Legs: Both Weakness of Arms/Hands: Both  Permission Sought/Granted                  Emotional Assessment              Admission diagnosis:  Fx up tibia w/ fibula-closed [S82.109A, S82.409A] Tibia/fibula fracture, left, closed, initial encounter [H37.169C, S82.402A] Patient Active Problem  List   Diagnosis Date Noted   Tibia/fibula fracture, left, closed, initial encounter 12/08/2020   Dysphagia    Acute esophagitis    Gastric erythema    Mucosal abnormality of duodenum    Schatzki's ring    Diarrhea 04/14/2020   Elevated BP without diagnosis of hypertension 04/14/2020   Urinary tract infection associated with indwelling urethral catheter ([place]) 02/17/2020   Muscle spasm 02/28/2019   Post-menopausal bleeding 11/20/2018   Ambulatory dysfunction 02/02/2018   Abnormality of gait 11/17/2017   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   Bilateral lower extremity pain (primary) (bilateral) (right greater than left) 08/01/2016   Chronic neck pain (secondary) (bilateral) ( left greater than right) 07/28/2016   Neutropenia ([place]) 06/27/2016   Chronic pain syndrome 06/03/2016   Neurogenic bladder    Neurogenic bowel    Detrusor and sphincter dyssynergia    Urinary incontinence    Seizure disorder ([place])    Slow transit constipation    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[place])    Lymphedema    Encounter for screening for cervical cancer 11/27/2015   Preventative health care 11/25/2015   History of breast cancer in female 10/28/2015   Anemia 10/28/2015   Thrombocytopenia ([place]) 10/28/2015   Allergic rhinitis 10/28/2015   IBS (irritable bowel syndrome) 10/28/2015   Uterine leiomyoma 10/24/2014  History of right mastectomy 10/24/2014   Medicare annual wellness visit, subsequent 10/24/2014   MS (multiple sclerosis) ([place]) 55/21/7471   Hematoma complicating a procedure 03/06/2014   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

## 2020-12-17 NOTE — Plan of Care (Signed)

## 2020-12-17 NOTE — TOC Progression Note (Signed)
Transition of Care [facility]) - Initial/Assessment Note    Patient Details  Name: Rebecca [name] MRN: [id number] Date of Birth: [birth year]  Transition of Care [facility]) CM/SW Contact:    Milinda Antis, [place] Phone Number: 12/17/2020, 10:45 AM  Clinical Narrative:                 Late Entry:  12/16/2020-  Phone call with patient and patient's spouse  16:45-CSW received a call from the floor RN reporting that that the patient's spouse requested to speak with CSW.  CSW contacted the patient and spouse.  CSW explained the d/c plan and that [facility] will accept the patient while she is COVID positive.  The patient's spouse was upset when given the information aeb, interrupting CSW while CSW was attempting to explain the new development numerous times, stating that the spouse knew this was "all about money and the government", and reporting that CSW "talked the doctor into changing his mind about keeping the patient in the hospital"  continually even after the patient attempted to redirect spouse.  The spouse would prefer that the patient stay in the hospital for 10 days and then transition to [facility].  Due to the patient's spouse inability to process the information being given due to heightened emotions, CSW ended the call requesting that the patient's spouse contact MD about any concerns as it relates to the patient's medical condition and encouraged the family to discuss d/c planning amongst each other during the evening and CSW would follow up with the patient in the A.M.    Expected Discharge Plan: Skilled Nursing Facility Barriers to Discharge: Continued Medical Work up   Patient Goals and CMS Choice        Expected Discharge Plan and Services Expected Discharge Plan: [place]         Expected Discharge Date: 12/14/20                                    Prior Living Arrangements/Services                       Activities of Daily Living Home  Assistive Devices/Equipment: Bedside commode/3-in-1, Electric scooter, Environmental consultant (specify type) ADL Screening (condition at time of admission) Patient's cognitive ability adequate to safely complete daily activities?: Yes Is the patient deaf or have difficulty hearing?: No Does the patient have difficulty seeing, even when wearing glasses/contacts?: No Does the patient have difficulty concentrating, remembering, or making decisions?: No Patient able to express need for assistance with ADLs?: Yes Does the patient have difficulty dressing or bathing?: No Independently performs ADLs?: Yes (appropriate for developmental age) Does the patient have difficulty walking or climbing stairs?: Yes Weakness of Legs: Both Weakness of Arms/Hands: Both  Permission Sought/Granted                  Emotional Assessment              Admission diagnosis:  Fx up tibia w/ fibula-closed [S82.109A, S82.409A] Tibia/fibula fracture, left, closed, initial encounter [E99.371I, S82.402A] Patient Active Problem List   Diagnosis Date Noted   Tibia/fibula fracture, left, closed, initial encounter 12/08/2020   Dysphagia    Acute esophagitis    Gastric erythema    Mucosal abnormality of duodenum    Schatzki's ring    Diarrhea 04/14/2020   Elevated BP without diagnosis of hypertension 04/14/2020  Urinary tract infection associated with indwelling urethral catheter ([place]) 02/17/2020   Muscle spasm 02/28/2019   Post-menopausal bleeding 11/20/2018   Ambulatory dysfunction 02/02/2018   Abnormality of gait 11/17/2017   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   Bilateral lower extremity pain (primary) (bilateral) (right greater than left) 08/01/2016   Chronic neck pain (secondary) (bilateral) ( left greater than right) 07/28/2016   Neutropenia ([place]) 06/27/2016   Chronic pain syndrome 06/03/2016   Neurogenic bladder    Neurogenic bowel    Detrusor and sphincter dyssynergia     Urinary incontinence    Seizure disorder ([place])    Slow transit constipation    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[place])    Lymphedema    Encounter for screening for cervical cancer 11/27/2015   Preventative health care 11/25/2015   History of breast cancer in female 10/28/2015   Anemia 10/28/2015   Thrombocytopenia ([place]) 10/28/2015   Allergic rhinitis 10/28/2015   IBS (irritable bowel syndrome) 10/28/2015   Uterine leiomyoma 10/24/2014   History of right mastectomy 10/24/2014   Medicare annual wellness visit, subsequent 10/24/2014   MS (multiple sclerosis) ([place]) 56/38/7564   Hematoma complicating a procedure 03/06/2014   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 [address] Phone: [phone] Fax: [phone]  [place], [place] [address] [place] [address] Phone: [phone] Fax: [phone]     Social Determinants of Health (SDOH) Interventions    Readmission Risk Interventions No flowsheet data found.

## 2020-12-18 DIAGNOSIS — S82202A Unspecified fracture of shaft of left tibia, initial encounter for closed fracture: Secondary | ICD-10-CM | POA: Diagnosis not present

## 2020-12-18 DIAGNOSIS — S82402A Unspecified fracture of shaft of left fibula, initial encounter for closed fracture: Secondary | ICD-10-CM | POA: Diagnosis not present

## 2020-12-18 LAB — CBC WITH DIFFERENTIAL/PLATELET
Abs Immature Granulocytes: 0.1 10*3/uL — ABNORMAL HIGH (ref 0.00–0.07)
Basophils Absolute: 0 10*3/uL (ref 0.0–0.1)
Basophils Relative: 0 %
Eosinophils Absolute: 0.2 10*3/uL (ref 0.0–0.5)
Eosinophils Relative: 4 %
HCT: 23.3 % — ABNORMAL LOW (ref 36.0–46.0)
Hemoglobin: 7.5 g/dL — ABNORMAL LOW (ref 12.0–15.0)
Immature Granulocytes: 2 %
Lymphocytes Relative: 33 %
Lymphs Abs: 1.4 10*3/uL (ref 0.7–4.0)
MCH: 30.6 pg (ref 26.0–34.0)
MCHC: 32.2 g/dL (ref 30.0–36.0)
MCV: 95.1 fL (ref 80.0–100.0)
Monocytes Absolute: 0.4 10*3/uL (ref 0.1–1.0)
Monocytes Relative: 10 %
Neutro Abs: 2.2 10*3/uL (ref 1.7–7.7)
Neutrophils Relative %: 51 %
Platelets: 210 10*3/uL (ref 150–400)
RBC: 2.45 MIL/uL — ABNORMAL LOW (ref 3.87–5.11)
RDW: 14.6 % (ref 11.5–15.5)
WBC: 4.3 10*3/uL (ref 4.0–10.5)
nRBC: 1.9 % — ABNORMAL HIGH (ref 0.0–0.2)

## 2020-12-18 LAB — COMPREHENSIVE METABOLIC PANEL
ALT: 50 U/L — ABNORMAL HIGH (ref 0–44)
AST: 33 U/L (ref 15–41)
Albumin: 2.4 g/dL — ABNORMAL LOW (ref 3.5–5.0)
Alkaline Phosphatase: 170 U/L — ABNORMAL HIGH (ref 38–126)
Anion gap: 6 (ref 5–15)
BUN: 9 mg/dL (ref 6–20)
CO2: 31 mmol/L (ref 22–32)
Calcium: 8.3 mg/dL — ABNORMAL LOW (ref 8.9–10.3)
Chloride: 101 mmol/L (ref 98–111)
Creatinine, Ser: 0.58 mg/dL (ref 0.44–1.00)
GFR, Estimated: >60 mL/min (ref >60)
Glucose, Bld: 106 mg/dL — ABNORMAL HIGH (ref 70–99)
Potassium: 4 mmol/L (ref 3.5–5.1)
Sodium: 138 mmol/L (ref 135–145)
Total Bilirubin: 0.6 mg/dL (ref 0.3–1.2)
Total Protein: 5.7 g/dL — ABNORMAL LOW (ref 6.5–8.1)

## 2020-12-18 MED ORDER — HEPARIN SOD (PORK) LOCK FLUSH 100 UNIT/ML IV SOLN
500.0000 [IU] | INTRAVENOUS | Status: AC | PRN
  Administered 2020-12-18: 500 [IU]
  Filled 2020-12-18: qty 5

## 2020-12-18 MED ORDER — GUAIFENESIN ER 600 MG PO TB12: 1200.0000 mg | ORAL_TABLET | Freq: Two times a day (BID) | ORAL | Status: DC

## 2020-12-18 MED ORDER — NIRMATRELVIR/RITONAVIR (PAXLOVID)TABLET: 3.0000 | ORAL_TABLET | Freq: Two times a day (BID) | ORAL | 0 refills | Status: AC

## 2020-12-18 NOTE — Progress Notes (Signed)
Pt discharged to [facility]. Discharge instructions given to [place], who said she was taking report for nurse, Denny Peon (who could be heard nearby asking questions). All of nurse Leah's and Med Aide's questions answered to their satisfaction. Pt left unit on stretcher pushed by ambulance staff. Left in stable condition. AVS updated and placed in discharge packet.

## 2020-12-18 NOTE — Discharge Instructions (Signed)
Advised to follow-up with primary care physician in 1 week. Advised to follow-up with orthopedics as scheduled. Advised to continue backslide 3 tablets twice daily for 3 days to complete 5-day course. Patient is found to have incidental positive COVID and remaining quarantine.

## 2020-12-18 NOTE — Discharge Summary (Signed)
Physician Discharge Summary  Rebecca [name] HCW:[id number] DOB: 1960-07-12 DOA: 12/08/2020  PCP: Delsa Grana, PA-C  Admit date: 12/08/2020  Discharge date: 12/18/2020  Admitted From: Home.  Disposition:  SNF  Recommendations for Outpatient Follow-up:  Follow up with PCP in 1-2 weeks. Please obtain BMP/CBC in one week. Advised to follow-up with orthopedics as scheduled.. Advised to continue paxloid 3 tablets twice daily for 3 days to complete 5-day course.. Patient is found to have incidental positive COVID and remaining in quarantine.  Home Health: None Equipment/Devices:None  Discharge Condition: Stable CODE STATUS:Full code Diet recommendation: Heart Healthy   Brief [facility] course: This 60 y.o. 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 Imaging of left knee showed severely displaced and comminuted proximal left tibial fracture which appears to extend to the articular surface of the lateral tibial plateau.  Also noted to have moderately displaced and comminuted proximal left fibula fracture.  Because of the complexity of her fracture, patient was transferred to [facility] from [facility]. On 11/2, patient underwent ORIF of left tibial plateau fracture by Dr. Doreatha Martin.  Patient has been doing better.  Participated in physical therapy recommended  skilled nursing facility.  Patient is incidental COVID positive.  Patient is started on paxloid 3 tablets twice daily for 5 days.  Initial plan was patient will remain in the hospital for 10 days after COVID-positive then she will be discharged to skilled nursing facility. Skilled facility has approved to accept this patient today.  Patient is being  discharged to SNF today for rehab.   She was managed for below problems.   Discharge Diagnoses:  Principal Problem:   Tibia/fibula fracture, left, closed, initial encounter Active Problems:   MS (multiple sclerosis) ([place])   Seizure disorder ([place])   Neurogenic bladder   Ambulatory dysfunction  COVID infection; -While in the hospital, on 11/7, patient complained of cough, sore throat. -Chest x-ray showed possible bronchitis without any consolidation or collapse.  COVID test was positive. -WBC count normal.  CRP was only slightly elevated. -Because of her immunocompromised status and high risk of progression, she is started on a 5-day course of Paxlovid till 11/13. -Discussed the risks and benefits with patient. -Robitussin scheduled and PRN for cough.  Left tibia/left fibula fracture -Sustained fracture after mechanical fall. -11/2, underwent ORIF of left tibial fracture. -DVT prophylaxis and pain meds per orthopedics team. -PT eval obtained.  SNF recommended.   Left leg cellulitis Chronic lymphedema of bilateral lower extremities -On 11/4, she was noted to have acute cellulitis of left leg with redness, tenderness and increased swelling.  Wound care consult was obtained.  Patient was started on IV Rocephin.  It seems to be clinically improving in terms of redness, swelling and pain.  No fever.  WBC count stable.  DVT scan negative.  -Currently on a 5-day course of oral Omnicef till 11/12.  Hepatic steatosis  -Elevated liver enzymes noted on 11/3.  Right upper quadrant ultrasound showed diffuse hepatic steatosis.  Hepatitis panel negative.  Liver enzymes slightly elevated but no clinical symptoms.  -Lipid panel 11/4 with LDL at 77, HDL at 48.   Acute blood loss anemia -Hemoglobin seems to be normal at baseline.  Postoperatively, hemoglobin has been  running low and stable between 7 and 8.  No active evidence of bleeding anywhere else. -Avoiding heparin products.  Currently on  Lovenox and SCD for DVT prophylaxis.   Multiple sclerosis -She is minimally ambulatory at baseline -Continue amantadine, Baclofen, Zanaflex -She is on Rebif 3x/week.  Continue the same   Seizure d/o -Continue Keppra   Neurogenic bladder -Has a chronic indwelling foley -Continue Ditropan.  Not on chronic antibiotics.   Obesity -Body mass index is 39.39 kg/m. Patient has been advised to make an attempt to improve diet and exercise patterns to aid in weight loss.    Discharge Instructions  Discharge Instructions     Diet - low sodium heart healthy   Complete by: As directed    Diet general   Complete by: As directed    Diet Orders (From admission, onward)    Start     Ordered   12/09/20 1120  Diet Heart Room service appropriate? Yes; Fluid  consistency: Thin  Diet effective now       Question Answer Comment  Room service appropriate? Yes   Fluid consistency: Thin     12/09/20 1120       Discharge instructions   Complete by: As directed    Follow with Primary MD Delsa Grana, PA-C in 7 days   Get CBC/BMP checked in next visit within 1 week by PCP or SNF MD. (We routinely change or add medications that can affect your baseline labs and fluid status, therefore we recommend that you get the mentioned basic workup next visit with your PCP, your PCP may decide not to get them or add new tests based on their clinical decision)  On your next visit with your PCP, please get your medicines reviewed and adjusted.  Please request your PCP  to go over all hospital tests, procedures, radiology results at the follow up, please get all Hospital records sent to your PCP by signing hospital release before you go home.  Activity: As tolerated with Full fall precautions use walker/cane & assistance as needed  Avoid using any recreational substances like cigarette, tobacco, alcohol, or non-prescribed drug.  If you experience worsening of your admission symptoms, develop shortness of  breath, life threatening emergency, suicidal or homicidal thoughts you must seek medical attention immediately by calling 911 or calling your MD immediately  if symptoms less severe.  You must read complete instructions/literature along with all the possible adverse reactions/side effects for all the medicines you take and that have been prescribed to you. Take any new medicine only after you have completely understood and accepted all the possible adverse reactions/side effects.   Do not drive, operate heavy machinery, perform activities at heights, swimming or participation in water activities or provide baby sitting services if your were admitted for syncope or siezures until you have seen by Primary MD or a Neurologist and advised to do so again.  Do not drive when taking Pain medications.  Do not take more than prescribed Pain, Sleep and Anxiety Medications  Wear Seat belts while driving.  Please note You were cared for by a hospitalist during your hospital stay. If you have any questions about your discharge medications or the care you received while you were in the hospital after you are discharged, you can call the unit and asked to speak with the hospitalist on call if the hospitalist that took care of you is not available. Once you are discharged, your primary care physician will handle any further medical  issues. Please note that NO REFILLS for any discharge medications will be authorized once you are discharged, as it is imperative that you return to your primary care physician (or establish a relationship with a primary care physician if you do not have one) for your aftercare needs so that they can reassess your need for medications and monitor your lab values.   Discharge wound care:   Complete by: As directed    Follow up orthopeadics   Increase activity slowly   Complete by: As directed    Increase activity slowly   Complete by: As directed    No dressing needed   Complete by: As  directed       Allergies as of 12/18/2020       Reactions   Fentanyl Nausea And Vomiting, Nausea Only   vomiting Was given in PACU x3 each time patient got sick   Sulfa Antibiotics Hives, Other (See Comments)   Light headed, over heated        Medication List     TAKE these medications    amantadine 100 MG capsule Commonly known as: SYMMETREL Take 1 capsule (100 mg total) by mouth 3 (three) times daily.   Assurance Vinyl Exam Gloves Misc Neurogenic bladder; MS; LON 99 months; for use for personal hygiene   baclofen 10 MG tablet Commonly known as: LIORESAL TAKE 1 TO 2 TABLETS BY MOUTH 3 TIMES A DAY What changed: See the new instructions.   bisacodyl 5 MG EC tablet Commonly known as: DULCOLAX Take 1 tablet (5 mg total) by mouth daily as needed for moderate constipation.   cefdinir 300 MG capsule Commonly known as: OMNICEF Take 1 capsule (300 mg total) by mouth 2 (two) times daily for 5 days.   COSAMIN DS PO Take 1 tablet by mouth 5 (five) times daily.   Creon 36000 UNITS Cpep capsule Generic drug: lipase/protease/amylase TAKE 1 CAPSULE BY MOUTH WITH BREAKFAST, WITH LUNCH, AND WITH EVENING MEAL AND 1 CAPSULE WITH SNACKS. What changed: See the new instructions.   docusate sodium 100 MG capsule Commonly known as: COLACE Take 1 capsule (100 mg total) by mouth 2 (two) times daily.   enoxaparin 40 MG/0.4ML injection Commonly known as: LOVENOX Inject 0.4 mLs (40 mg total) into the skin daily.   feeding supplement Liqd Take 237 mLs by mouth daily.   guaiFENesin 600 MG 12 hr tablet Commonly known as: MUCINEX Take 2 tablets (1,200 mg total) by mouth 2 (two) times daily.   Hair Skin and Nails Formula Tabs Take 1 tablet by mouth 2 (two) times daily.   HYDROcodone-acetaminophen 7.5-325 MG tablet Commonly known as: NORCO Take 1-2 tablets by mouth every 6 (six) hours as needed for severe pain or moderate pain (1 tab moderate pain, 2 tablets severe pain).    ibuprofen 600 MG tablet Commonly known as: ADVIL TAKE 1 TABLET BY MOUTH EVERY 8 HOURS AS NEEDED. What changed: reasons to take this   Leg Vein & Circulation Tabs Take 1 tablet by mouth 2 (two) times daily.   levETIRAcetam 500 MG tablet Commonly known as: KEPPRA Take 1 tablet (500 mg total) by mouth 2 (two) times daily.   lidocaine-prilocaine cream Commonly known as: EMLA Apply 1 application topically as needed.   loratadine 10 MG tablet Commonly known as: CLARITIN Take 10 mg by mouth daily.   MULTIVITAMIN PO Take 1 tablet by mouth daily.   nirmatrelvir/ritonavir EUA 20 x 150 MG & 10 x 100MG  Tabs Commonly known as:  PAXLOVID Take 3 tablets by mouth 2 (two) times daily for 3 days. Take nirmatrelvir (150 mg) two tablets twice daily for 5 days and ritonavir (100 mg) one tablet twice daily for 5 days.   nystatin powder Commonly known as: MYCOSTATIN/NYSTOP Apply 1 application topically 3 (three) times daily as needed. For rash/raw skin as needed   omeprazole 40 MG capsule Commonly known as: PRILOSEC TAKE 1 CAPSULE (40 MG TOTAL) BY MOUTH DAILY. What changed: additional instructions   oxybutynin 10 MG 24 hr tablet Commonly known as: DITROPAN-XL TAKE 1 TABLET BY MOUTH TWICE A DAY What changed: when to take this   polyethylene glycol 17 g packet Commonly known as: MIRALAX / GLYCOLAX Take 17 g by mouth daily.   Rebif Rebidose 44 MCG/0.5ML Soaj Generic drug: Interferon Beta-1a Inject  44 mcg Sub-q 3 times weekly Rotate site after each injection What changed: See the new instructions.   tiZANidine 4 MG tablet Commonly known as: ZANAFLEX TAKE 1 TABLET (4 MG TOTAL) BY MOUTH 2 (TWO) TIMES DAILY. What changed: when to take this   Turmeric 500 MG Caps Take 500 mg by mouth daily.   Vitamin D3 25 MCG tablet Commonly known as: Vitamin D Take 1 tablet (1,000 Units total) by mouth daily.               Discharge Care Instructions  (From admission, onward)            Start     Ordered   12/18/20 0000  Discharge wound care:       Comments: Follow up orthopeadics   12/18/20 0945   12/14/20 0000  No dressing needed        12/14/20 1551            Contact information for follow-up providers     Haddix, Thomasene Lot, MD. Schedule an appointment as soon as possible for a visit in 2 week(s).   Specialty: Orthopedic Surgery Why: for wound check, repeat x-rays, suture removal Contact information: [place] [address] [phone]         Delsa Grana, PA-C Follow up.   Specialty: Family Medicine Contact information: [address] [place] [place] [address] [phone]              Contact information for after-discharge care     Destination     [facility] Preferred SNF .   Service: Skilled Nursing Contact information: [place] [address] [phone]                    Allergies  Allergen Reactions   Fentanyl Nausea And Vomiting and Nausea Only    vomiting Was given in PACU x3 each time patient got sick    Sulfa Antibiotics Hives and Other (See Comments)    Light headed, over heated    Consultations: Orthopedics   Procedures/Studies: DG Chest 1 View  Result Date: 12/07/2020 CLINICAL DATA:  Preop EXAM: CHEST  1 VIEW COMPARISON:  Chest x-ray 03/13/2014, chest x-ray 04/06/2014. FINDINGS: Left chest wall Port-A-Cath with tip overlying the superior cavoatrial junction. The heart and mediastinal contours are within normal limits. Left base atelectasis. No focal consolidation. No pulmonary edema. No pleural effusion. No pneumothorax. No acute osseous abnormality. IMPRESSION: No active disease. Electronically Signed   By: Iven Finn M.D.   On: 12/07/2020 19:09   DG Knee 1-2 Views Left  Result Date: 12/09/2020 CLINICAL DATA:  Fractured  tibia and fibula EXAM: LEFT KNEE - 1-2 VIEW COMPARISON:  12/07/2020 FINDINGS: Fluoroscopic  images show internal fixation of comminuted displaced fracture of proximal tibia. There is improvement in alignment with no evidence of displacement or angulation at the fracture site in the final images. There is a comminuted fracture in the neck of left fibula. Fluoroscopic time was 61 seconds.  Radiation dose is 4.65 mGy. IMPRESSION: Fluoroscopic assistance was provided for internal fixation of comminuted fracture of left tibia. Electronically Signed   By: Elmer Picker M.D.   On: 12/09/2020 10:23   DG Ankle Complete Left  Result Date: 12/07/2020 CLINICAL DATA:  Left ankle pain after fall. EXAM: LEFT ANKLE COMPLETE - 3+ VIEW COMPARISON:  None. FINDINGS: The bones are diffusely under mineralized. No obvious fracture. There is equivocal widening of the lateral clear space, alternatively this may be due to positioning. Subcortical cystic change in the lateral talar dome, chronic. Mild degenerative distal tibial spurring. Moderate midfoot dorsal spurring. Fragmented plantar calcaneal spur. Generalized soft tissue edema. IMPRESSION: 1. Osteopenia/osteoporosis without obvious fracture. 2. Equivocal widening of the lateral clear clear space versus positioning. 3. 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 Subchondral cyst in the lateral talar dome. 4. Fragmented plantar calcaneal spur. Electronically Signed   By: [name] Rake M.D.   On: 12/07/2020 17:17   CT Knee Left Wo Contrast  Result Date: 12/07/2020 CLINICAL DATA:  Left leg pain after fall. EXAM: CT OF THE left KNEE WITHOUT CONTRAST TECHNIQUE: Multidetector CT imaging of the left knee was performed according to the standard protocol. Multiplanar CT image reconstructions were also generated. COMPARISON:  Radiographs of same day. FINDINGS: Severely displaced and comminuted fracture is seen involving the proximal left tibia, which appears to extend to the articular surface of the lateral tibial plateau. Also noted is moderately  displaced and comminuted proximal left fibular fracture. No significant joint effusion is noted. Moderate degenerative joint disease of the patellofemoral space is noted with osteophyte formation. IMPRESSION: Severely displaced and comminuted proximal left tibial fracture, which appears to extend to the articular surface of the lateral tibial plateau. Also noted is moderately displaced and comminuted proximal left fibular fracture. Electronically Signed   By: Marijo Conception M.D.   On: 12/07/2020 17:25   DG Chest Port 1 View  Result Date: 12/14/2020 CLINICAL DATA:  Coughing over the last several days. EXAM: PORTABLE CHEST 1 VIEW COMPARISON:  12/07/2020 FINDINGS: Power port in place on the left with the tip at the proximal right atrium. Heart size upper limits of normal. Mediastinal shadows are normal. Mild chronic scarring at the left lung base. The lungs are otherwise clear. There may be mild central bronchial thickening. No acute consolidation or collapse. No effusion. Previous ACDF is noted. IMPRESSION: Possible bronchitis. No acute consolidation or collapse. Chronic scarring at left lung base. Electronically Signed   By: Nelson Chimes M.D.   On: 12/14/2020 10:53   DG Knee Complete 4 Views Left  Result Date: 12/07/2020 CLINICAL DATA:  Left leg pain.  Fall. EXAM: LEFT KNEE - COMPLETE 4+ VIEW COMPARISON:  None. FINDINGS: Comminuted proximal tibia and fibular fractures are present. Fracture extends to the lateral tibial plateau. Femur is intact. Joint effusion is noted. IMPRESSION: 1. Comminuted proximal tibia and fibular fractures. 2. Joint effusion. Electronically Signed   By: San Morelle M.D.   On: 12/07/2020 14:28   DG Knee Left Port  Result Date: 12/09/2020 CLINICAL DATA:  Postop left proximal tibia and fibula surgery. EXAM: PORTABLE  LEFT KNEE - 1-2 VIEW COMPARISON:  Same-day intraoperative radiograph at 9:22 a.m., CT knee 12/07/2020 FINDINGS: Interval ORIF of comminuted proximal tibia  fracture with lateral plate and screw fixation. Fracture fragments are in anatomic alignment. Hardware is intact without evidence of complication. Mildly displaced, mildly impacted comminuted transverse fracture of the proximal fibula is unchanged. Small amount of subcutaneous emphysema, consistent with immediate postoperative status. IMPRESSION: 1. Expected postoperative changes status post ORIF of comminuted proximal left tibia fracture. 2. Unchanged mildly displaced, mildly impacted comminuted fracture of the proximal left fibula. Electronically Signed   By: Ileana Roup M.D.   On: 12/09/2020 11:12   DG C-Arm 1-60 Min-No Report  Result Date: 12/09/2020 Fluoroscopy was utilized by the requesting physician.  No radiographic interpretation.   VAS [place] LOWER EXTREMITY VENOUS (DVT)  Result Date: 12/12/2020  Lower Venous DVT Study Patient Name:  Rebecca [name]  Date of Exam:   12/12/2020 Medical Rec #: [id number]       Accession #:    [id number] Date of Birth: [birth year]        Patient Gender: F Patient Age:   66 years Exam Location:  [facility] Procedure:      VAS [place] LOWER EXTREMITY VENOUS (DVT) Referring Phys: Terrilee Croak --------------------------------------------------------------------------------  Indications: Edema, cellulitis. Status post ORIF of left tibial plateau fracture.  Risk Factors: Chronic lymphedema. Limitations: Body habitus and edema. Comparison Study: Prior negative left LEV done 05/22/19 Performing Technologist: Sharion Dove RVS  Examination Guidelines: A complete evaluation includes B-mode imaging, spectral Doppler, color Doppler, and power Doppler as needed of all accessible portions of each vessel. Bilateral testing is considered an integral part of a complete examination. Limited examinations for reoccurring indications may be performed as noted. The reflux portion of the exam is performed with the patient in reverse Trendelenburg.   +-----+---------------+---------+-----------+----------+--------------+ RIGHTCompressibilityPhasicitySpontaneityPropertiesThrombus Aging +-----+---------------+---------+-----------+----------+--------------+ CFV  Full           Yes      Yes                                 +-----+---------------+---------+-----------+----------+--------------+   +---------+---------------+---------+-----------+----------+-------------------+ LEFT     CompressibilityPhasicitySpontaneityPropertiesThrombus Aging      +---------+---------------+---------+-----------+----------+-------------------+ CFV      Full           Yes      Yes                                      +---------+---------------+---------+-----------+----------+-------------------+ SFJ      Full                                                             +---------+---------------+---------+-----------+----------+-------------------+ FV Prox  Full                                                             +---------+---------------+---------+-----------+----------+-------------------+ FV Mid   Full                                                             +---------+---------------+---------+-----------+----------+-------------------+  FV DistalFull                                                             +---------+---------------+---------+-----------+----------+-------------------+ PFV      Full                                                             +---------+---------------+---------+-----------+----------+-------------------+ POP                     Yes      Yes                                      +---------+---------------+---------+-----------+----------+-------------------+ PTV                                                   Not well visualized +---------+---------------+---------+-----------+----------+-------------------+ PERO                                                   Not well visualized +---------+---------------+---------+-----------+----------+-------------------+     Summary: RIGHT: - No evidence of common femoral vein obstruction.  LEFT: - There is no evidence of deep vein thrombosis in the lower extremity. However, portions of this examination were limited- see technologist comments above.  - Ultrasound characteristics of enlarged lymph nodes noted in the groin.  *See table(s) above for measurements and observations. Electronically signed by Monica Martinez MD on 12/12/2020 at 3:34:50 PM.    Final    US Abdomen Limited RUQ (LIVER/GB)  Result Date: 12/10/2020 CLINICAL DATA:  Elevated liver enzymes EXAM: ULTRASOUND ABDOMEN LIMITED RIGHT UPPER QUADRANT COMPARISON:  None. FINDINGS: Gallbladder: Status post cholecystectomy. Common bile duct: Diameter: 1.1 cm, within normal limits for age status post cholecystectomy. Liver: Diffuse increased echogenicity of the liver parenchyma. No focal lesion. Portal vein is patent on color Doppler imaging with normal direction of blood flow towards the liver. Other: None. IMPRESSION: Diffuse increased echogenicity of the liver parenchyma, suggestive of diffuse hepatocellular disease such as steatosis. Electronically Signed   By: Albin Felling M.D.   On: 12/10/2020 11:43   S/p ORIF left tibia-fibula.   Subjective: Patient was seen and examined at bedside.  Overnight events noted.  Patient reports pain is better controlled.  She denies any other concerns.  Patient is being discharged to skilled nursing facility for rehab today.  Discharge Exam: Vitals:   12/18/20 0653 12/18/20 0820  BP: 122/70 100/73  Pulse: 79 84  Resp: 20 15  Temp: 98.1 F (36.7 C) 97.6 F (36.4 C)  SpO2: 98% 96%   Vitals:   12/17/20 1649 12/17/20 2148 12/18/20 0653 12/18/20 0820  BP: 131/62 124/70 122/70 100/73  Pulse: 82 90 79 84  Resp:  19 20  15  Temp: 97.8 F (36.6 C) 98.3 F (36.8 C) 98.1 F (36.7 C) 97.6 F (36.4 C)   TempSrc: Oral Oral Oral Oral  SpO2: 100% 96% 98% 96%  Weight:   103.1 kg   Height:        General: Pt is alert, awake, not in acute distress Cardiovascular: RRR, S1/S2 +, no rubs, no gallops Respiratory: CTA bilaterally, no wheezing, no rhonchi Abdominal: Soft, NT, ND, bowel sounds + Extremities: Dressing noted.  She reports pain is better controlled.    The results of significant diagnostics from this hospitalization (including imaging, microbiology, ancillary and laboratory) are listed below for reference.     Microbiology: Recent Results (from the past 240 hour(s))  Surgical pcr screen     Status: Abnormal   Collection Time: 12/08/20  3:36 PM   Specimen: Nasal Mucosa; Nasal Swab  Result Value Ref Range Status   MRSA, PCR NEGATIVE NEGATIVE Final   Staphylococcus aureus POSITIVE (A) NEGATIVE Final    Comment: (NOTE) The Xpert SA Assay (FDA approved for NASAL specimens in patients 72 years of age and older), is one component of a comprehensive surveillance program. It is not intended to diagnose infection nor to guide or monitor treatment. Performed at [facility], [place] [address] [place] [place], [place] [address]   Respiratory (~20 pathogens) panel by PCR     Status: None   Collection Time: 12/14/20  2:01 PM   Specimen: Nasopharyngeal Swab; Respiratory  Result Value Ref Range Status   Adenovirus NOT DETECTED NOT DETECTED Final   Coronavirus 229E NOT DETECTED NOT DETECTED Final    Comment: (NOTE) The Coronavirus on the Respiratory Panel, DOES NOT test for the novel  Coronavirus (2019 nCoV)    Coronavirus HKU1 NOT DETECTED NOT DETECTED Final   Coronavirus NL63 NOT DETECTED NOT DETECTED Final   Coronavirus OC43 NOT DETECTED NOT DETECTED Final   Metapneumovirus NOT DETECTED NOT DETECTED Final   Rhinovirus / Enterovirus NOT DETECTED NOT DETECTED Final   Influenza A NOT DETECTED NOT DETECTED Final   Influenza B NOT DETECTED NOT DETECTED Final   Parainfluenza Virus 1 NOT  DETECTED NOT DETECTED Final   Parainfluenza Virus 2 NOT DETECTED NOT DETECTED Final   Parainfluenza Virus 3 NOT DETECTED NOT DETECTED Final   Parainfluenza Virus 4 NOT DETECTED NOT DETECTED Final   Respiratory Syncytial Virus NOT DETECTED NOT DETECTED Final   Bordetella pertussis NOT DETECTED NOT DETECTED Final   Bordetella Parapertussis NOT DETECTED NOT DETECTED Final   Chlamydophila pneumoniae NOT DETECTED NOT DETECTED Final   Mycoplasma pneumoniae NOT DETECTED NOT DETECTED Final    Comment: Performed at [facility], [place]. [address], [place], [place] [address]  Resp Panel by RT-PCR (Flu A&B, Covid) Nasopharyngeal Swab     Status: Abnormal   Collection Time: 12/14/20  2:01 PM   Specimen: Nasopharyngeal Swab; Nasopharyngeal(NP) swabs in vial transport medium  Result Value Ref Range Status   SARS Coronavirus 2 by RT PCR POSITIVE (A) NEGATIVE Final    Comment: RESULT CALLED TO, READ BACK BY AND VERIFIED WITH: LAUREN COLLIE RN 12/14/2020 @1824  BY JW (NOTE) SARS-CoV-2 target nucleic acids are DETECTED.  The SARS-CoV-2 RNA is generally detectable in upper respiratory specimens during the acute phase of infection. Positive results are indicative of the presence of the identified virus, but do not rule out bacterial infection or co-infection with other pathogens not detected by the test. Clinical correlation with patient history and other diagnostic information is  necessary to determine patient infection status. The expected result is Negative.  Fact Sheet for Patients: EntrepreneurPulse.com.au  Fact Sheet for Healthcare Providers: IncredibleEmployment.be  This test is not yet approved or cleared by the [place] FDA and  has been authorized for detection and/or diagnosis of SARS-CoV-2 by FDA under an Emergency Use Authorization (EUA).  This EUA will remain in effect (meaning this test c an be used) for the duration of  the COVID-19  declaration under Section 564(b)(1) of the Act, 21 U.S.C. section 360bbb-3(b)(1), unless the authorization is terminated or revoked sooner.     Influenza A by PCR NEGATIVE NEGATIVE Final   Influenza B by PCR NEGATIVE NEGATIVE Final    Comment: (NOTE) The Xpert Xpress SARS-CoV-2/FLU/RSV plus assay is intended as an aid in the diagnosis of influenza from Nasopharyngeal swab specimens and should not be used as a sole basis for treatment. Nasal washings and aspirates are unacceptable for Xpert Xpress SARS-CoV-2/FLU/RSV testing.  Fact Sheet for Patients: EntrepreneurPulse.com.au  Fact Sheet for Healthcare Providers: IncredibleEmployment.be  This test is not yet approved or cleared by the [place] FDA and has been authorized for detection and/or diagnosis of SARS-CoV-2 by FDA under an Emergency Use Authorization (EUA). This EUA will remain in effect (meaning this test can be used) for the duration of the COVID-19 declaration under Section 564(b)(1) of the Act, 21 U.S.C. section 360bbb-3(b)(1), unless the authorization is terminated or revoked.  Performed at [facility], [place] [address], [place], [place] [address]      Labs: BNP (last 3 results) No results for input(s): BNP in the last 8760 hours. Basic Metabolic Panel: Recent Labs  Lab 12/12/20 0500 12/14/20 1420 12/16/20 0300 12/18/20 0433  NA 133* 134* 134* 138  K 3.7 3.7 3.6 4.0  CL 99 97* 97* 101  CO2 27 28 29 31   GLUCOSE 131* 133* 118* 106*  BUN 8 13 11 9   CREATININE 0.47 0.44 0.47 0.58  CALCIUM 8.6* 8.4* 8.6* 8.3*   Liver Function Tests: Recent Labs  Lab 12/12/20 0500 12/14/20 1420 12/18/20 0433  AST 85* 57* 33  ALT 142* 111* 50*  ALKPHOS 167* 187* 170*  BILITOT 1.1 0.4 0.6  PROT 6.1* 6.3* 5.7*  ALBUMIN 2.7* 2.7* 2.4*   No results for input(s): LIPASE, AMYLASE in the last 168 hours. No results for input(s): AMMONIA in the last 168 hours. CBC: Recent Labs   Lab 12/12/20 0500 12/14/20 1420 12/16/20 0300 12/18/20 0433  WBC 5.6 4.1 3.8* 4.3  NEUTROABS 4.2 2.6 1.9 2.2  HGB 7.8* 7.9* 7.9* 7.5*  HCT 23.3* 24.0* 24.9* 23.3*  MCV 89.6 90.6 92.9 95.1  PLT 150 198 217 210   Cardiac Enzymes: No results for input(s): CKTOTAL, CKMB, CKMBINDEX, TROPONINI in the last 168 hours. BNP: Invalid input(s): POCBNP CBG: Recent Labs  Lab 12/17/20 2145  GLUCAP 108*   D-Dimer No results for input(s): DDIMER in the last 72 hours. Hgb A1c No results for input(s): HGBA1C in the last 72 hours. Lipid Profile No results for input(s): CHOL, HDL, LDLCALC, TRIG, CHOLHDL, LDLDIRECT in the last 72 hours. Thyroid function studies No results for input(s): TSH, T4TOTAL, T3FREE, THYROIDAB in the last 72 hours.  Invalid input(s): FREET3 Anemia work up No results for input(s): VITAMINB12, FOLATE, FERRITIN, TIBC, IRON, RETICCTPCT in the last 72 hours. Urinalysis    Component Value Date/Time   COLORURINE YELLOW (A) 08/01/2020 1919   APPEARANCEUR CLOUDY (A) 08/01/2020 1919   APPEARANCEUR Clear 04/03/2017 1409  LABSPEC 1.011 08/01/2020 1919   LABSPEC 1.014 02/28/2011 1534   PHURINE 5.0 08/01/2020 1919   GLUCOSEU NEGATIVE 08/01/2020 1919   GLUCOSEU Negative 02/28/2011 1534   HGBUR MODERATE (A) 08/01/2020 [place] 08/01/2020 1919   BILIRUBINUR negative 07/16/2018 1453   BILIRUBINUR Negative 04/03/2017 1409   BILIRUBINUR Negative 02/28/2011 [place] 08/01/2020 [place] NEGATIVE 08/01/2020 1919   UROBILINOGEN negative (A) 07/16/2018 1453   NITRITE NEGATIVE 08/01/2020 1919   LEUKOCYTESUR LARGE (A) 08/01/2020 1919   LEUKOCYTESUR Trace 02/28/2011 1534   Sepsis Labs Invalid input(s): PROCALCITONIN,  WBC,  LACTICIDVEN Microbiology Recent Results (from the past 240 hour(s))  Surgical pcr screen     Status: Abnormal   Collection Time: 12/08/20  3:36 PM   Specimen: Nasal Mucosa; Nasal Swab  Result Value Ref Range  Status   MRSA, PCR NEGATIVE NEGATIVE Final   Staphylococcus aureus POSITIVE (A) NEGATIVE Final    Comment: (NOTE) The Xpert SA Assay (FDA approved for NASAL specimens in patients 17 years of age and older), is one component of a comprehensive surveillance program. It is not intended to diagnose infection nor to guide or monitor treatment. Performed at [facility], [place] [address], [place], [place] [address]   Respiratory (~20 pathogens) panel by PCR     Status: None   Collection Time: 12/14/20  2:01 PM   Specimen: Nasopharyngeal Swab; Respiratory  Result Value Ref Range Status   Adenovirus NOT DETECTED NOT DETECTED Final   Coronavirus 229E NOT DETECTED NOT DETECTED Final    Comment: (NOTE) The Coronavirus on the Respiratory Panel, DOES NOT test for the novel  Coronavirus (2019 nCoV)    Coronavirus HKU1 NOT DETECTED NOT DETECTED Final   Coronavirus NL63 NOT DETECTED NOT DETECTED Final   Coronavirus OC43 NOT DETECTED NOT DETECTED Final   Metapneumovirus NOT DETECTED NOT DETECTED Final   Rhinovirus / Enterovirus NOT DETECTED NOT DETECTED Final   Influenza A NOT DETECTED NOT DETECTED Final   Influenza B NOT DETECTED NOT DETECTED Final   Parainfluenza Virus 1 NOT DETECTED NOT DETECTED Final   Parainfluenza Virus 2 NOT DETECTED NOT DETECTED Final   Parainfluenza Virus 3 NOT DETECTED NOT DETECTED Final   Parainfluenza Virus 4 NOT DETECTED NOT DETECTED Final   Respiratory Syncytial Virus NOT DETECTED NOT DETECTED Final   Bordetella pertussis NOT DETECTED NOT DETECTED Final   Bordetella Parapertussis NOT DETECTED NOT DETECTED Final   Chlamydophila pneumoniae NOT DETECTED NOT DETECTED Final   Mycoplasma pneumoniae NOT DETECTED NOT DETECTED Final    Comment: Performed at [facility], [place]. [address], [place], [place] [address]  Resp Panel by RT-PCR (Flu A&B, Covid) Nasopharyngeal Swab     Status: Abnormal   Collection Time: 12/14/20  2:01 PM   Specimen: Nasopharyngeal Swab;  Nasopharyngeal(NP) swabs in vial transport medium  Result Value Ref Range Status   SARS Coronavirus 2 by RT PCR POSITIVE (A) NEGATIVE Final    Comment: RESULT CALLED TO, READ BACK BY AND VERIFIED WITH: LAUREN COLLIE RN 12/14/2020 @1824  BY JW (NOTE) SARS-CoV-2 target nucleic acids are DETECTED.  The SARS-CoV-2 RNA is generally detectable in upper respiratory specimens during the acute phase of infection. Positive results are indicative of the presence of the identified virus, but do not rule out bacterial infection or co-infection with other pathogens not detected by the test. Clinical correlation with patient history and other diagnostic information is necessary to determine patient infection status.  The expected result is Negative.  Fact Sheet for Patients: EntrepreneurPulse.com.au  Fact Sheet for Healthcare Providers: IncredibleEmployment.be  This test is not yet approved or cleared by the [place] FDA and  has been authorized for detection and/or diagnosis of SARS-CoV-2 by FDA under an Emergency Use Authorization (EUA).  This EUA will remain in effect (meaning this test c an be used) for the duration of  the COVID-19 declaration under Section 564(b)(1) of the Act, 21 U.S.C. section 360bbb-3(b)(1), unless the authorization is terminated or revoked sooner.     Influenza A by PCR NEGATIVE NEGATIVE Final   Influenza B by PCR NEGATIVE NEGATIVE Final    Comment: (NOTE) The Xpert Xpress SARS-CoV-2/FLU/RSV plus assay is intended as an aid in the diagnosis of influenza from Nasopharyngeal swab specimens and should not be used as a sole basis for treatment. Nasal washings and aspirates are unacceptable for Xpert Xpress SARS-CoV-2/FLU/RSV testing.  Fact Sheet for Patients: EntrepreneurPulse.com.au  Fact Sheet for Healthcare Providers: IncredibleEmployment.be  This test is not yet approved or cleared by the  [place] FDA and has been authorized for detection and/or diagnosis of SARS-CoV-2 by FDA under an Emergency Use Authorization (EUA). This EUA will remain in effect (meaning this test can be used) for the duration of the COVID-19 declaration under Section 564(b)(1) of the Act, 21 U.S.C. section 360bbb-3(b)(1), unless the authorization is terminated or revoked.  Performed at [facility], [place] [address], [place], [place] [address]      Time coordinating discharge: Over 30 minutes  SIGNED:   Shawna Clamp, MD  [facility] 12/18/2020, 10:31 AM Pager   If 7PM-7AM, please contact night-coverage

## 2020-12-18 NOTE — TOC Transition Note (Addendum)
Transition of Care [facility]) - CM/SW Discharge Note   Patient Details  Name: DERIAN PFOST MRN: [id number] Date of Birth: Nov 23, 1960  [facility]) CM/SW Contact:  Milinda Antis, [place] Phone Number: 12/18/2020, 11:51 AM   Clinical Narrative:     Patient will DC to: [facility] Anticipated DC date: 12/18/2020 Family notified: Yes Transport by: Corey Harold   Per MD patient ready for DC to SNF. RN to call report prior to discharge [phone] room 164. RN, patient, patient's family, and facility notified of DC.  CSW spoke with the patient and the patient's aunt Benay Spice and explained that [facility] was the only facility that could accept the patient at this time due to her being COVID positive.  CSW explained that the attending has put in the discharge summary and order and the patient is medically ready.  The patient and aunt expressed understanding and CSW informed the patient and aunt that Corey Harold would be called to transport the patient today.  Discharge Summary and FL2 sent to facility. DC packet on chart. Ambulance transport requested for patient.   CSW will sign off for now as social work intervention is no longer needed. Please consult [place] again if new needs arise.      Barriers to Discharge: Continued Medical Work up   Patient Goals and CMS Choice        Discharge Placement                       Discharge Plan and Services                                     Social Determinants of Health (SDOH) Interventions     Readmission Risk Interventions No flowsheet data found.

## 2020-12-22 ENCOUNTER — Other Ambulatory Visit: Payer: Self-pay | Admitting: Physical Medicine & Rehabilitation

## 2020-12-22 ENCOUNTER — Other Ambulatory Visit: Payer: Self-pay | Admitting: Diagnostic Neuroimaging

## 2020-12-24 ENCOUNTER — Other Ambulatory Visit: Payer: Self-pay | Admitting: Family Medicine

## 2020-12-30 ENCOUNTER — Telehealth: Payer: Self-pay | Admitting: Diagnostic Neuroimaging

## 2020-12-30 NOTE — Telephone Encounter (Signed)
Noted, thank you for the update

## 2020-12-30 NOTE — Telephone Encounter (Signed)
FYI- pt called wanting to inform Dr. Leta Baptist she is currently in a assisted living facility. Pt fell and fractured her leg receiving therapy at the facility until she can be transferred somewhere closer to home.Rebecca [name]

## 2021-01-04 ENCOUNTER — Telehealth (INDEPENDENT_AMBULATORY_CARE_PROVIDER_SITE_OTHER): Payer: Medicare Other | Admitting: Diagnostic Neuroimaging

## 2021-01-04 ENCOUNTER — Encounter: Payer: Self-pay | Admitting: Diagnostic Neuroimaging

## 2021-01-04 DIAGNOSIS — G40909 Epilepsy, unspecified, not intractable, without status epilepticus: Secondary | ICD-10-CM

## 2021-01-04 DIAGNOSIS — R5383 Other fatigue: Secondary | ICD-10-CM

## 2021-01-04 DIAGNOSIS — G35 Multiple sclerosis: Secondary | ICD-10-CM | POA: Diagnosis not present

## 2021-01-04 NOTE — Progress Notes (Signed)
    Virtual Visit via Telephone VISIT (video tried but could not connect)  I connected with Rebecca [name] on 01/04/21 at  3:30 PM EST by telephone call and verified that I am speaking with the correct person using two identifiers.   I discussed the limitations, risks, security and privacy concerns of performing an evaluation and management service by telephone and the availability of in person appointments. I also discussed with the patient that there may be a patient responsible charge related to this service. The patient expressed understanding and agreed to proceed.  Patient location: at rehab facilty Physician location: office   History of Present Illness:  UPDATE (01/04/21, VRP): patient now at rehab facility. Fell at home, and broke her left tibia and fibula (Dec 08, 2020). MS has been stable.   UPDATE (12/16/19 , VRP): Since last visit, doing well; now s/p yeast UTI, on diflucan, and pain in feet reduced. Has seen Dr. Posey Pronto. Has home health nurse; urine still has some odor and patient needs follow up with urology. Tolerating rebif.   PRIOR HPI: - tolerating rebif; had Moderna covid vaccine (May and June 2021); overall doing well at home. - continuing with gait issues - tolerating muscle relaxers and amantadine    Observations/Objective:  Telemed visit   Assessment and Plan:   60 y.o. 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 Last dose tysabri 10/29/10. Switched to [place] on 04/25/11.    Had brief seizure before starting gilenya, now on levetiracetam.    Then with right V2, V3 herpes zoster on 05/27/11. Gilenya stopped. Shingles resolved.  Then on rebif. Also s/p ACDF C5-6 (Oct 2013) for spinal stenosis.    Then with breast CA diagnosis, s/p right mastectomy, s/p adriamycin, cytoxan, taxol. Now treatments have been completed.    Now back on rebif (since Jan 2017).   Now with new onset  headaches (June 2017). MRI brain unremarkable. HA now improving.    Dx:  1. Multiple sclerosis ([place])   2. Seizure disorder ([place])   3. Other fatigue       MULTIPLE SCLEROSIS (established problem, stable) - continue rebif - continue home health PT / nursing   URINARY INCONTINENCE / RECURRENT UTI - continue oxybutynin - follow up with urology   MUSCLE SPASMS - continue baclofen and tizanidine for muscle spasms (per Dr. Posey Pronto; stable)   MS fatigue - continue amantadine for fatigue (stable)   SEIZURE DISORDER - continue levetiracetam for seizure d/o (stable)  No orders of the defined types were placed in this encounter.   Follow Up Instructions:  - Return in about 10 months (around 11/04/2021).     I discussed the assessment and treatment plan with the patient. The patient was provided an opportunity to ask questions and all were answered. The patient agreed with the plan and demonstrated an understanding of the instructions.   The patient was advised to call back or seek an in-person evaluation if the symptoms worsen or if the condition fails to improve as anticipated.  I provided 15 minutes of non-face-to-face time during this encounter.   Penni Bombard, MD 14/78/2956, 2:13 PM Certified in Neurology, Neurophysiology and Neuroimaging  [facility] [address] [place], [place] [address] [phone]

## 2021-01-08 ENCOUNTER — Other Ambulatory Visit: Payer: Self-pay

## 2021-01-08 ENCOUNTER — Inpatient Hospital Stay: Payer: Medicare Other | Attending: Oncology

## 2021-01-08 DIAGNOSIS — C50911 Malignant neoplasm of unspecified site of right female breast: Secondary | ICD-10-CM | POA: Diagnosis present

## 2021-01-08 DIAGNOSIS — Z452 Encounter for adjustment and management of vascular access device: Secondary | ICD-10-CM | POA: Insufficient documentation

## 2021-01-08 DIAGNOSIS — Z95828 Presence of other vascular implants and grafts: Secondary | ICD-10-CM

## 2021-01-08 MED ORDER — HEPARIN SOD (PORK) LOCK FLUSH 100 UNIT/ML IV SOLN
500.0000 [IU] | Freq: Once | INTRAVENOUS | Status: AC
  Administered 2021-01-08: 500 [IU] via INTRAVENOUS
  Filled 2021-01-08: qty 5

## 2021-01-08 MED ORDER — SODIUM CHLORIDE 0.9% FLUSH
10.0000 mL | Freq: Once | INTRAVENOUS | Status: AC
  Administered 2021-01-08: 10 mL via INTRAVENOUS
  Filled 2021-01-08: qty 10

## 2021-01-19 ENCOUNTER — Other Ambulatory Visit: Payer: Self-pay | Admitting: Diagnostic Neuroimaging

## 2021-02-05 ENCOUNTER — Other Ambulatory Visit: Payer: Self-pay | Admitting: Diagnostic Neuroimaging

## 2021-02-26 ENCOUNTER — Inpatient Hospital Stay: Payer: Medicare Other | Attending: Oncology

## 2021-02-26 ENCOUNTER — Other Ambulatory Visit: Payer: Self-pay

## 2021-02-26 DIAGNOSIS — Z452 Encounter for adjustment and management of vascular access device: Secondary | ICD-10-CM | POA: Insufficient documentation

## 2021-02-26 DIAGNOSIS — Z853 Personal history of malignant neoplasm of breast: Secondary | ICD-10-CM | POA: Diagnosis present

## 2021-02-26 DIAGNOSIS — Z95828 Presence of other vascular implants and grafts: Secondary | ICD-10-CM

## 2021-02-26 MED ORDER — HEPARIN SOD (PORK) LOCK FLUSH 100 UNIT/ML IV SOLN
500.0000 [IU] | Freq: Once | INTRAVENOUS | Status: AC
  Administered 2021-02-26: 500 [IU] via INTRAVENOUS
  Filled 2021-02-26: qty 5

## 2021-02-26 MED ORDER — SODIUM CHLORIDE 0.9% FLUSH
10.0000 mL | INTRAVENOUS | Status: DC | PRN
  Administered 2021-02-26: 10 mL via INTRAVENOUS
  Filled 2021-02-26: qty 10

## 2021-03-17 ENCOUNTER — Other Ambulatory Visit: Payer: Self-pay | Admitting: Urology

## 2021-03-17 ENCOUNTER — Telehealth: Payer: Self-pay | Admitting: *Deleted

## 2021-03-17 DIAGNOSIS — R8290 Unspecified abnormal findings in urine: Secondary | ICD-10-CM

## 2021-03-17 MED ORDER — RENACIDIN IR SOLN: 1 refills | Status: DC

## 2021-03-17 NOTE — Progress Notes (Signed)
Renacidin, 30 mL into suprapubic tube and clamp tube for 10 minutes and then drain twice daily prescription sent to facility.

## 2021-03-17 NOTE — Telephone Encounter (Signed)
Home calling asking for pt to have her catheter evaluated? She also mentioned about having cath flushed with vinegar solution and they would need a order per there facility dr. I advised we do not change cath, she stated they do and would need order to flush.   I'm confused, please advise

## 2021-03-17 NOTE — Telephone Encounter (Signed)
She's in a nursing facility and they need an order to use the vinegar solution. They will manage cath care.  appointment scheduled

## 2021-04-15 ENCOUNTER — Ambulatory Visit: Payer: Medicare Other | Admitting: Urology

## 2021-04-15 NOTE — Progress Notes (Incomplete)
04/16/21 12:36 PM   Rebecca [name] Nov 13, 1960 [id number]  Referring provider:  Delsa Grana, PA-C [address] [place],  [place] [address] Chief Complaint  Patient presents with   Other    Urological history  1. Neurogenic bladder -managed with an indwelling Foley -UDS on 04/20/2020 small capacity 60 cc neurogenic bladder with notable right-sided reflux with relatively low bladder pressures  2. Chronic bacteriuria  -secondary to chronic indwelling Foley       HPI: Rebecca [name] is a 61 y.o.female who presents today for further discussion of foley catheter care.   She reports that she is interested in a SPT tube due to her recurrent UTIs and lack of irrigation a facility. She reports that she has seen brownish discharge in her catheter. She reports that she has told caregivers to flush her catheter more often. She reports that she had a UTI a week and a half ago.   Patient denies any modifying or aggravating factors.  Patient denies any gross hematuria, dysuria or suprapubic/flank pain.  Patient denies any fevers, chills, nausea or vomiting.    PMH: Past Medical History:  Diagnosis Date   Allergy    Aortic valve disease    Mild AS / AI - most recent Echo demonstrated tricuspid aortic valve.   Bacterial endocarditis    History of .   Bilateral lower extremity edema    Noncardiac.  Chronic. LE Venous dopplers - negative for DVT.; Echocardiogram January 2016: Normal EF with normal wall motion and valve function. Only grade 1 diastolic dysfunction. EF 60-65%. Mild MR   Breast cancer ([place]) 12/31/2013   Right breast, 12:00, 1.5 cm, T1c,N0 invasive mammary carcinoma, triple negative. --> Rx with Chemo   Cervical stenosis of spine    GERD (gastroesophageal reflux disease) April or May 2022   Heart murmur 2013   Herpes zoster    IBS (irritable bowel syndrome)    Lymphedema    has legs [facility] at [facility]   Multiple sclerosis ([place]) 2001   Walks from room to room @ home;  but Wheelchair when going out.   Neuromuscular disorder ([place])    MS   PONV (postoperative nausea and vomiting)    Related to Fentanyl   Seizures ([facility])    Takes Keppra   Syncope and collapse     Surgical History: Past Surgical History:  Procedure Laterality Date   ANKLE SURGERY     Left   ANKLE SURGERY     ANTERIOR CERVICAL DECOMP/DISCECTOMY FUSION  11/17/2011   Procedure: ANTERIOR CERVICAL DECOMPRESSION/DISCECTOMY FUSION 2 LEVELS;  Surgeon: Erline Levine, MD;  Location: [place] [facility];  Service: Neurosurgery;  Laterality: N/A;  Cervical Five-Six Six-Seven Anterior cervical decompression/diskectomy/fusion   BREAST BIOPSY Right 12/31/2013   invasive mammary   BREAST SURGERY Right 02/03/2014   Right simple mastectomy with sentinel node biopsy.   CHOLECYSTECTOMY     COLONOSCOPY  2014   ESOPHAGOGASTRODUODENOSCOPY (EGD) WITH PROPOFOL N/A 07/16/2020   Procedure: ESOPHAGOGASTRODUODENOSCOPY (EGD) WITH PROPOFOL;  Surgeon: Virgel Manifold, MD;  Location: [facility] ENDOSCOPY;  Service: Endoscopy;  Laterality: N/A;  Patient has MS and will need assistance   HYSTEROSCOPY WITH D & C N/A 11/27/2018   Procedure: DILATATION AND CURETTAGE /HYSTEROSCOPY;  Surgeon: Gae Dry, MD;  Location: [facility] ORS;  Service: Gynecology;  Laterality: N/A;   Lower extremity venous Dopplers  02/27/2013   No LE DVT   MASTECTOMY Right 2015   ORIF TIBIA PLATEAU Left 12/09/2020   Procedure:  LEFT OPEN REDUCTION INTERNAL FIXATION (ORIF) TIBIAL PLATEAU;  Surgeon: Shona Needles, MD;  Location: [place];  Service: Orthopedics;  Laterality: Left;   Port a cath insertion Right 01/19/2010   PORT-A-CATH REMOVAL     right   PORT-A-CATH REMOVAL Right 09/03/2013   Procedure: REMOVAL PORT-A-CATH;  Surgeon: Conrad [place]stown, MD;  Location: [place];  Service: Vascular;  Laterality: Right;   SPINE SURGERY  Cervical steinois Dr. Vertell Limber 2014 I think?   TONSILLECTOMY     TRANSTHORACIC ECHOCARDIOGRAM  03/2013; 02/2014   a) Normal LV size and  function with EF 60-65%.; Cannot exclude bicuspid aortic valve with mild AS and mild AI.; b) Normal EF with normal wall motion and valve function x Mild MR. G2 DD. EF 60-65%. Tricuspid AoV   UPPER GI ENDOSCOPY  2014    Home Medications:  Allergies as of 04/16/2021       Reactions   Fentanyl Nausea And Vomiting, Nausea Only   vomiting Was given in PACU x3 each time patient got sick   Sulfa Antibiotics Hives, Other (See Comments)   Light headed, over heated        Medication List        Accurate as of April 16, 2021 12:36 PM. If you have any questions, ask your nurse or doctor.          amantadine 100 MG capsule Commonly known as: SYMMETREL TAKE 1 CAPSULE BY MOUTH THREE TIMES A DAY   Assurance Vinyl Exam Gloves Misc Neurogenic bladder; MS; LON 99 months; for use for personal hygiene   baclofen 10 MG tablet Commonly known as: LIORESAL TAKE 1 TO 2 TABLETS BY MOUTH 3 TIMES A DAY What changed: See the new instructions.   bisacodyl 5 MG EC tablet Commonly known as: DULCOLAX Take 1 tablet (5 mg total) by mouth daily as needed for moderate constipation.   COSAMIN DS PO Take 1 tablet by mouth 5 (five) times daily.   Creon 36000 UNITS Cpep capsule Generic drug: lipase/protease/amylase TAKE 1 CAPSULE BY MOUTH WITH BREAKFAST, WITH LUNCH, AND WITH EVENING MEAL AND 1 CAPSULE WITH SNACKS. What changed: See the new instructions.   docusate sodium 100 MG capsule Commonly known as: COLACE Take 1 capsule (100 mg total) by mouth 2 (two) times daily.   enoxaparin 40 MG/0.4ML injection Commonly known as: LOVENOX Inject 0.4 mLs (40 mg total) into the skin daily.   feeding supplement Liqd Take 237 mLs by mouth daily.   gluconic acid-citric acid irrigation Insert 30 mL into suprapubic tube and clamp tube for 10 minutes and then drain twice daily   guaiFENesin 600 MG 12 hr tablet Commonly known as: MUCINEX Take 2 tablets (1,200 mg total) by mouth 2 (two) times daily.   Hair  Skin and Nails Formula Tabs Take 1 tablet by mouth 2 (two) times daily.   HYDROcodone-acetaminophen 7.5-325 MG tablet Commonly known as: NORCO Take 1-2 tablets by mouth every 6 (six) hours as needed for severe pain or moderate pain (1 tab moderate pain, 2 tablets severe pain).   ibuprofen 600 MG tablet Commonly known as: ADVIL TAKE 1 TABLET BY MOUTH EVERY 8 HOURS AS NEEDED. What changed: reasons to take this   Leg Vein & Circulation Tabs Take 1 tablet by mouth 2 (two) times daily.   levETIRAcetam 500 MG tablet Commonly known as: KEPPRA Take 1 tablet (500 mg total) by mouth 2 (two) times daily.   lidocaine-prilocaine cream Commonly known as: EMLA Apply 1 application  topically as needed.   loratadine 10 MG tablet Commonly known as: CLARITIN Take 10 mg by mouth daily.   MULTIVITAMIN PO Take 1 tablet by mouth daily.   nystatin powder Commonly known as: MYCOSTATIN/NYSTOP Apply 1 application topically 3 (three) times daily as needed. For rash/raw skin as needed   omeprazole 40 MG capsule Commonly known as: PRILOSEC TAKE 1 CAPSULE (40 MG TOTAL) BY MOUTH DAILY. What changed: additional instructions   oxybutynin 10 MG 24 hr tablet Commonly known as: DITROPAN-XL TAKE 1 TABLET BY MOUTH TWICE A DAY What changed: when to take this   polyethylene glycol 17 g packet Commonly known as: MIRALAX / GLYCOLAX Take 17 g by mouth daily.   Rebif Rebidose 44 MCG/0.5ML Soaj Generic drug: Interferon Beta-1a Inject  44 mcg Sub-q 3 times weekly Rotate site after each injection   tiZANidine 4 MG tablet Commonly known as: ZANAFLEX TAKE 1 TABLET (4 MG TOTAL) BY MOUTH 2 (TWO) TIMES DAILY. What changed: when to take this   Turmeric 500 MG Caps Take 500 mg by mouth daily.        Allergies:  Allergies  Allergen Reactions   Fentanyl Nausea And Vomiting and Nausea Only    vomiting Was given in PACU x3 each time patient got sick    Sulfa Antibiotics Hives and Other (See Comments)     Light headed, over heated    Family History: Family History  Problem Relation Age of Onset   Cancer Father        skin   Heart disease Father    Heart attack Father        heart attack in his 33's   Thyroid disease Sister    Ovarian cancer Cousin    Breast cancer Maternal Aunt 60   Breast cancer Maternal Grandmother 74   Bladder Cancer Neg Hx    Kidney cancer Neg Hx     Social History:  reports that she has never smoked. She has never used smokeless tobacco. She reports that she does not drink alcohol and does not use drugs.   Physical Exam: BP 133/84    Pulse 90   Constitutional:  Alert and oriented, No acute distress. HEENT: [place] AT, moist mucus membranes.  Trachea midline, no masses. Cardiovascular: No clubbing, cyanosis, or edema. Respiratory: Normal respiratory effort, no increased work of breathing. Skin: No rashes, bruises or suspicious lesions. Neurologic: Grossly intact, no focal deficits, moving all 4 extremities. Psychiatric: Normal mood and affect.  Laboratory Data: Lab Results  Component Value Date   CREATININE 0.58 12/18/2020   Lab Results  Component Value Date   HGBA1C 5.1 09/27/2019   Component     Latest Ref Rng & Units 12/18/2020  Sodium     135 - 145 mmol/L 138  Potassium     3.5 - 5.1 mmol/L 4.0  Chloride     98 - 111 mmol/L 101  CO2     22 - 32 mmol/L 31  Glucose     70 - 99 mg/dL 106 (H)  BUN     6 - 20 mg/dL 9  Creatinine     0.44 - 1.00 mg/dL 0.58  Calcium     8.9 - 10.3 mg/dL 8.3 (L)  Total Protein     6.5 - 8.1 g/dL 5.7 (L)  Albumin     3.5 - 5.0 g/dL 2.4 (L)  AST     15 - 41 U/L 33  ALT     0 -  44 U/L 50 (H)  Alkaline Phosphatase     38 - 126 U/L 170 (H)  Total Bilirubin     0.3 - 1.2 mg/dL 0.6  GFR, Estimated     >60 mL/min >60  Anion gap     5 - 15 6    Assessment & Plan:   1. Neurogenic bladder -Managed with an indwelling Foley. She is interested in a SPT today discussed the concern of ureteral leakage. Will  place SPT in future.  - Currently foley in interim needs to be exchanged every 30 days/    2. Sediment clogging her catheter  - Instruct facility to irrigate her catheter with Renacidin x2 daily  - Will have her undergo a KUB to rule out any underlying issues such as bladder stones contributing to the sediment.   No follow-ups on file.  [place] [place] [address] [place], [place] [address] [phone]  I,Kailey Littlejohn,acting as a scribe for [facility], PA-C.,have documented all relevant documentation on the behalf of SHANNON MCGOWAN, PA-C,as directed by  [facility], PA-C while in the presence of [place], PA-C.

## 2021-04-16 ENCOUNTER — Inpatient Hospital Stay: Payer: Medicare Other | Attending: Oncology

## 2021-04-16 ENCOUNTER — Ambulatory Visit (INDEPENDENT_AMBULATORY_CARE_PROVIDER_SITE_OTHER): Payer: Medicare Other | Admitting: Urology

## 2021-04-16 ENCOUNTER — Ambulatory Visit
Admission: RE | Admit: 2021-04-16 | Discharge: 2021-04-16 | Disposition: A | Discharge status: Home / Self Care | Payer: Medicare Other | Source: Ambulatory Visit | Attending: Urology | Admitting: Urology

## 2021-04-16 ENCOUNTER — Inpatient Hospital Stay: Payer: Medicare Other

## 2021-04-16 ENCOUNTER — Ambulatory Visit
Admission: RE | Admit: 2021-04-16 | Discharge: 2021-04-16 | Disposition: A | Discharge status: Home / Self Care | Payer: Medicare Other | Attending: Urology | Admitting: Urology

## 2021-04-16 ENCOUNTER — Other Ambulatory Visit: Payer: Self-pay

## 2021-04-16 VITALS — BP 133/84 | HR 90

## 2021-04-16 DIAGNOSIS — R8290 Unspecified abnormal findings in urine: Secondary | ICD-10-CM | POA: Diagnosis not present

## 2021-04-16 DIAGNOSIS — N390 Urinary tract infection, site not specified: Secondary | ICD-10-CM | POA: Diagnosis present

## 2021-04-16 DIAGNOSIS — Z853 Personal history of malignant neoplasm of breast: Secondary | ICD-10-CM | POA: Insufficient documentation

## 2021-04-16 DIAGNOSIS — Z95828 Presence of other vascular implants and grafts: Secondary | ICD-10-CM

## 2021-04-16 DIAGNOSIS — Z452 Encounter for adjustment and management of vascular access device: Secondary | ICD-10-CM | POA: Insufficient documentation

## 2021-04-16 MED ORDER — RENACIDIN IR SOLN: 1 refills | Status: DC

## 2021-04-16 MED ORDER — HEPARIN SOD (PORK) LOCK FLUSH 100 UNIT/ML IV SOLN
500.0000 [IU] | Freq: Once | INTRAVENOUS | Status: AC
  Administered 2021-04-16: 500 [IU] via INTRAVENOUS
  Filled 2021-04-16: qty 5

## 2021-04-16 MED ORDER — SODIUM CHLORIDE 0.9% FLUSH
10.0000 mL | Freq: Once | INTRAVENOUS | Status: AC
  Administered 2021-04-16: 10 mL via INTRAVENOUS
  Filled 2021-04-16: qty 10

## 2021-04-19 ENCOUNTER — Other Ambulatory Visit: Payer: Self-pay | Admitting: Family Medicine

## 2021-04-23 ENCOUNTER — Encounter: Payer: Self-pay | Admitting: Urology

## 2021-04-28 ENCOUNTER — Telehealth: Payer: Self-pay | Admitting: Family Medicine

## 2021-04-28 DIAGNOSIS — N319 Neuromuscular dysfunction of bladder, unspecified: Secondary | ICD-10-CM

## 2021-04-28 NOTE — Telephone Encounter (Signed)
Spoke to patient to confirmed she has not been taking Lovenox. She states she has not taken it in several months. An order has been placed for Suprapubic tube placement. I will reach out when appointment has been scheduled.  ?

## 2021-05-03 NOTE — Telephone Encounter (Signed)
Patient notified SPT placement appointment is at 12:30 on 05/06/2021. She is in Rehab at [facility] [phone]. The facility has been notified to have transportation available.  ?

## 2021-05-05 ENCOUNTER — Telehealth: Payer: Self-pay | Admitting: Family Medicine

## 2021-05-05 NOTE — Telephone Encounter (Signed)
Patient called IR and cancelled her SPT placement. She states she does want to do it but not at this time. She has the number to IR and will call to schedule when she is ready.  ?

## 2021-05-06 ENCOUNTER — Ambulatory Visit: Admission: RE | Admit: 2021-05-06 | Payer: Medicare Other | Source: Ambulatory Visit

## 2021-06-04 ENCOUNTER — Inpatient Hospital Stay: Payer: Medicare Other | Attending: Oncology

## 2021-06-04 DIAGNOSIS — Z9221 Personal history of antineoplastic chemotherapy: Secondary | ICD-10-CM | POA: Insufficient documentation

## 2021-06-04 DIAGNOSIS — Z452 Encounter for adjustment and management of vascular access device: Secondary | ICD-10-CM | POA: Diagnosis present

## 2021-06-04 DIAGNOSIS — Z853 Personal history of malignant neoplasm of breast: Secondary | ICD-10-CM | POA: Diagnosis present

## 2021-06-04 DIAGNOSIS — Z95828 Presence of other vascular implants and grafts: Secondary | ICD-10-CM

## 2021-06-04 MED ORDER — HEPARIN SOD (PORK) LOCK FLUSH 100 UNIT/ML IV SOLN
500.0000 [IU] | Freq: Once | INTRAVENOUS | Status: AC
  Administered 2021-06-04: 500 [IU] via INTRAVENOUS
  Filled 2021-06-04: qty 5

## 2021-06-04 MED ORDER — SODIUM CHLORIDE 0.9% FLUSH
10.0000 mL | Freq: Once | INTRAVENOUS | Status: AC
  Administered 2021-06-04: 10 mL via INTRAVENOUS
  Filled 2021-06-04: qty 10

## 2021-06-27 NOTE — Progress Notes (Unsigned)
[place]  [phone] [phone]) [phone]  ID: Rebecca [name] OB: Jul 03, 1960  MR#: [id number]  KXF#:[id number]  Patient Care Team: Delsa Grana, PA-C as PCP - General (Family Medicine) Bobetta Lime, MD as Referring Physician Byrnett, Forest Gleason, MD (General Surgery) Penni Bombard, MD as Consulting Physician (Neurology) Lloyd Huger, MD as Consulting Physician (Oncology) Leonie Man, MD as Consulting Physician (Cardiology) Minna Merritts, MD as Consulting Physician (Cardiology) Laneta [place] as Physician Assistant (Urology) Cathi Roan, [facility] (Inactive) (Pharmacist)  CHIEF COMPLAINT: 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 BRCA negative.  INTERVAL HISTORY: Patient returns to clinic today for routine 65-month evaluation.  She has no new neurologic complaints and her multiple sclerosis has been stable.  She currently feels well and is at her baseline.  She does not complain of pain today. Her lower extremity edema is unchanged.  She denies any recent fevers or illnesses.  She denies any chest pain, shortness of breath, cough, or hemoptysis.  She denies any nausea, vomiting, constipation, or diarrhea.  She has no urinary complaints.  Patient offers no specific complaints today.  REVIEW OF SYSTEMS:   Review of Systems  Constitutional:  Negative for chills, fever and malaise/fatigue.  Respiratory:  Negative for cough and shortness of breath.   Cardiovascular:  Positive for leg swelling. Negative for chest pain.  Gastrointestinal:  Negative for abdominal pain, constipation, diarrhea, nausea and vomiting.  Genitourinary: Negative.  Negative for dysuria, frequency and urgency.  Musculoskeletal:  Positive for back pain.  Skin: Negative.  Negative for rash.  Neurological:  Positive for focal weakness. Negative for sensory change, weakness and headaches.  Psychiatric/Behavioral: Negative.  The patient is not  nervous/anxious.    As per HPI. Otherwise, a complete review of systems is negative.  PAST MEDICAL HISTORY: Past Medical History:  Diagnosis Date   Allergy    Aortic valve disease    Mild AS / AI - most recent Echo demonstrated tricuspid aortic valve.   Bacterial endocarditis    History of .   Bilateral lower extremity edema    Noncardiac.  Chronic. LE Venous dopplers - negative for DVT.; Echocardiogram January 2016: Normal EF with normal wall motion and valve function. Only grade 1 diastolic dysfunction. EF 60-65%. Mild MR   Breast cancer ([place]) 12/31/2013   Right breast, 12:00, 1.5 cm, T1c,N0 invasive mammary carcinoma, triple negative. --> Rx with Chemo   Cervical stenosis of spine    GERD (gastroesophageal reflux disease) April or May 2022   Heart murmur 2013   Herpes zoster    IBS (irritable bowel syndrome)    Lymphedema    has legs wrapped at [facility]   Multiple sclerosis ([place]) 2001   Walks from room to room @ home; but Wheelchair when going out.   Neuromuscular disorder ([place])    MS   PONV (postoperative nausea and vomiting)    Related to Fentanyl   Seizures ([place])    Takes Keppra   Syncope and collapse     PAST SURGICAL HISTORY: Past Surgical History:  Procedure Laterality Date   ANKLE SURGERY     Left   ANKLE SURGERY     ANTERIOR CERVICAL DECOMP/DISCECTOMY FUSION  11/17/2011   Procedure: ANTERIOR CERVICAL DECOMPRESSION/DISCECTOMY FUSION 2 LEVELS;  Surgeon: Erline Levine, MD;  Location: [place] [facility]UR[facility];  Service: Neurosurgery;  Laterality: N/A;  Cervical Five-Six Six-Seven Anterior cervical decompression/diskectomy/fusion   BREAST BIOPSY Right 12/31/2013   invasive mammary  BREAST SURGERY Right 02/03/2014   Right simple mastectomy with sentinel node biopsy.   CHOLECYSTECTOMY     COLONOSCOPY  2014   ESOPHAGOGASTRODUODENOSCOPY (EGD) WITH PROPOFOL N/A 07/16/2020   Procedure: ESOPHAGOGASTRODUODENOSCOPY (EGD) WITH PROPOFOL;  Surgeon: Virgel Manifold, MD;  Location: [facility]  ENDOSCOPY;  Service: Endoscopy;  Laterality: N/A;  Patient has MS and will need assistance   HYSTEROSCOPY WITH D & C N/A 11/27/2018   Procedure: DILATATION AND CURETTAGE /HYSTEROSCOPY;  Surgeon: Gae Dry, MD;  Location: [facility] ORS;  Service: Gynecology;  Laterality: N/A;   Lower extremity venous Dopplers  02/27/2013   No LE DVT   MASTECTOMY Right 2015   ORIF TIBIA PLATEAU Left 12/09/2020   Procedure: LEFT OPEN REDUCTION INTERNAL FIXATION (ORIF) TIBIAL PLATEAU;  Surgeon: Shona Needles, MD;  Location: [facility];  Service: Orthopedics;  Laterality: Left;   Port a cath insertion Right 01/19/2010   PORT-A-CATH REMOVAL     right   PORT-A-CATH REMOVAL Right 09/03/2013   Procedure: REMOVAL PORT-A-CATH;  Surgeon: Conrad Stephenson, MD;  Location: [facility];  Service: Vascular;  Laterality: Right;   SPINE SURGERY  Cervical steinois Dr. Vertell Limber 2014 I think?   TONSILLECTOMY     TRANSTHORACIC ECHOCARDIOGRAM  03/2013; 02/2014   a) Normal LV size and function with EF 60-65%.; Cannot exclude bicuspid aortic valve with mild AS and mild AI.; b) Normal EF with normal wall motion and valve function x Mild MR. G2 DD. EF 60-65%. Tricuspid AoV   UPPER GI ENDOSCOPY  2014    FAMILY HISTORY Family History  Problem Relation Age of Onset   Cancer Father        skin   Heart disease Father    Heart attack Father        heart attack in his 81's   Thyroid disease Sister    Ovarian cancer Cousin    Breast cancer Maternal Aunt 60   Breast cancer Maternal Grandmother 74   Bladder Cancer Neg Hx    Kidney cancer Neg Hx        ADVANCED DIRECTIVES:    HEALTH MAINTENANCE: Social History   Tobacco Use   Smoking status: Never   Smokeless tobacco: Never   Tobacco comments:    Never smoked. My mom smoked. Passed 04/18/20. Dad smoked, quit 1973. Sister smokes.  Vaping Use   Vaping Use: Never used  Substance Use Topics   Alcohol use: No   Drug use: No     Colonoscopy:  PAP:  Bone density:  Lipid  panel:  Allergies  Allergen Reactions   Fentanyl Nausea And Vomiting and Nausea Only    vomiting Was given in PACU x3 each time patient got sick    Sulfa Antibiotics Hives and Other (See Comments)    Light headed, over heated    Current Outpatient Medications  Medication Sig Dispense Refill   amantadine (SYMMETREL) 100 MG capsule TAKE 1 CAPSULE BY MOUTH THREE TIMES A DAY 270 capsule 4   baclofen (LIORESAL) 10 MG tablet TAKE 1 TO 2 TABLETS BY MOUTH 3 TIMES A DAY (Patient taking differently: 10-20 mg 3 (three) times daily.) 540 tablet 0   bisacodyl (DULCOLAX) 5 MG EC tablet Take 1 tablet (5 mg total) by mouth daily as needed for moderate constipation. 30 tablet 0   Disposable Gloves (ASSURANCE VINYL EXAM GLOVES) MISC Neurogenic bladder; MS; LON 99 months; for use for personal hygiene 50 each 11   docusate sodium (COLACE) 100 MG capsule Take 1  capsule (100 mg total) by mouth 2 (two) times daily. 10 capsule 0   feeding supplement (ENSURE ENLIVE / ENSURE PLUS) LIQD Take 237 mLs by mouth daily. 237 mL 12   gluconic acid-citric acid (RENACIDIN) irrigation Insert 30 mL into suprapubic tube and clamp tube for 10 minutes and then drain twice daily 500 mL 1   Glucosamine-Chondroitin (COSAMIN DS PO) Take 1 tablet by mouth 5 (five) times daily.     ibuprofen (ADVIL) 600 MG tablet TAKE 1 TABLET BY MOUTH EVERY 8 HOURS AS NEEDED. (Patient taking differently: Take 600 mg by mouth every 8 (eight) hours as needed for moderate pain, mild pain or headache.) 90 tablet 3   levETIRAcetam (KEPPRA) 500 MG tablet Take 1 tablet (500 mg total) by mouth 2 (two) times daily. 180 tablet 4   lidocaine-prilocaine (EMLA) cream Apply 1 application topically as needed. 30 g 0   lipase/protease/amylase (CREON) 36000 UNITS CPEP capsule TAKE 1 CAPSULE BY MOUTH WITH BREAKFAST, WITH LUNCH, AND WITH EVENING MEAL AND 1 CAPSULE WITH SNACKS. (Patient taking differently: Take 36,000 Units by mouth 3 (three) times daily with meals.) 540  capsule 2   loratadine (CLARITIN) 10 MG tablet Take 10 mg by mouth daily.     Misc Natural Products (LEG VEIN & CIRCULATION) TABS Take 1 tablet by mouth 2 (two) times daily.      Multiple Vitamins-Minerals (HAIR SKIN AND NAILS FORMULA) TABS Take 1 tablet by mouth 2 (two) times daily.      Multiple Vitamins-Minerals (MULTIVITAMIN PO) Take 1 tablet by mouth daily.      nystatin (MYCOSTATIN/NYSTOP) powder Apply 1 application topically 3 (three) times daily as needed. For rash/raw skin as needed 15 g 2   omeprazole (PRILOSEC) 40 MG capsule TAKE 1 CAPSULE (40 MG TOTAL) BY MOUTH DAILY. (Patient taking differently: Take 40 mg by mouth daily. [place] Kitchen) 30 capsule 0   oxybutynin (DITROPAN-XL) 10 MG 24 hr tablet TAKE 1 TABLET BY MOUTH TWICE A DAY (Patient taking differently: Take 10 mg by mouth in the morning and at bedtime.) 180 tablet 4   polyethylene glycol (MIRALAX / GLYCOLAX) 17 g packet Take 17 g by mouth daily.     REBIF REBIDOSE 44 MCG/0.5ML SOAJ Inject  44 mcg Sub-q 3 times weekly Rotate site after each injection 12 mL 3   tiZANidine (ZANAFLEX) 4 MG tablet TAKE 1 TABLET (4 MG TOTAL) BY MOUTH 2 (TWO) TIMES DAILY. (Patient taking differently: Take 4 mg by mouth in the morning and at bedtime.) 180 tablet 1   Turmeric 500 MG CAPS Take 500 mg by mouth daily.     No current facility-administered medications for this visit.    OBJECTIVE: There were no vitals filed for this visit.    There is no height or weight on file to calculate BMI.    ECOG FS:2 - Symptomatic, <50% confined to bed  General: Well-developed, well-nourished, no acute distress.  Sitting in a wheelchair. Eyes: Pink conjunctiva, anicteric sclera. HEENT: Normocephalic, moist mucous membranes. Lungs: No audible wheezing or coughing. Heart: Regular rate and rhythm. Abdomen: Soft, nontender, no obvious distention. Musculoskeletal: 2-3+ bilateral lower extremity edema. Neuro: Alert, answering all questions appropriately. Cranial nerves grossly  intact. Skin: No rashes or petechiae noted. Psych: Normal affect.   LAB RESULTS:  Lab Results  Component Value Date   NA 138 12/18/2020   K 4.0 12/18/2020   CL 101 12/18/2020   CO2 31 12/18/2020   GLUCOSE 106 (H) 12/18/2020   BUN 9  12/18/2020   CREATININE 0.58 12/18/2020   CALCIUM 8.3 (L) 12/18/2020   PROT 5.7 (L) 12/18/2020   ALBUMIN 2.4 (L) 12/18/2020   AST 33 12/18/2020   ALT 50 (H) 12/18/2020   ALKPHOS 170 (H) 12/18/2020   BILITOT 0.6 12/18/2020   GFRNONAA >60 12/18/2020   GFRAA 117 01/18/2019    Lab Results  Component Value Date   WBC 4.3 12/18/2020   NEUTROABS 2.2 12/18/2020   HGB 7.5 (L) 12/18/2020   HCT 23.3 (L) 12/18/2020   MCV 95.1 12/18/2020   PLT 210 12/18/2020      STUDIES: No results found.   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 BRCA negative.  PLAN:    1. Triple negative right breast cancer: No evidence of disease. Given her difficulties with Taxol and multiple sclerosis, treatment was discontinued after a total of 6 of 12 cycles of Taxol. Her last chemotherapy was July 17, 2014.  Because patient had a full mastectomy, she did not require adjuvant XRT. Given the fact that she is a triple negative cancer, she does not require an aromatase inhibitor.  Patient's most recent left screening mammogram on September 06, 2018 was reported as BI-RADS 1.  Repeat in July 2021.  CA 27-29 continues to be within normal limits, today's result is pending.  Patient is now greater than 5 years out from completing her treatment and can be transitioned to yearly visits. 2. Multiple sclerosis: Continue evaluation and current treatment per neurology. Her primary neurologist is Dr. Andrey Spearman.  phone [phone], fax [phone]. 3. Lymphedema: Chronic and unchanged.  Continue treatment and wraps as prescribed. 4. Peripheral neuropathy: Patient does not complain of this today. 5. Pain: Patient is no longer taking narcotics. 6.   Thrombocytopenia: Resolved. 7.  Foley catheter: Patient states she was previously evaluated for a suprapubic catheter, but is unclear if this procedure is going to proceed or not.  I spent a total of 20 minutes reviewing chart data, face-to-face evaluation with the patient, counseling and coordination of care as detailed above.    Patient expressed understanding and was in agreement with this plan. She also understands that She can call clinic at any time with any questions, concerns, or complaints.   Breast cancer   Staging form: Breast, AJCC 7th Edition     Pathologic stage from 05/24/2014: Stage IA (T1c, N0, cM0) - Signed by Lloyd Huger, MD on 05/24/2014   Lloyd Huger, MD   06/27/2021 7:04 AM

## 2021-06-29 DIAGNOSIS — H900 Conductive hearing loss, bilateral: Secondary | ICD-10-CM | POA: Insufficient documentation

## 2021-06-29 DIAGNOSIS — H6123 Impacted cerumen, bilateral: Secondary | ICD-10-CM

## 2021-06-29 HISTORY — DX: Impacted cerumen, bilateral: H61.23

## 2021-07-01 ENCOUNTER — Inpatient Hospital Stay: Payer: Medicare Other

## 2021-07-01 ENCOUNTER — Inpatient Hospital Stay: Payer: Medicare Other | Admitting: Oncology

## 2021-07-01 DIAGNOSIS — C50911 Malignant neoplasm of unspecified site of right female breast: Secondary | ICD-10-CM

## 2021-07-15 ENCOUNTER — Other Ambulatory Visit: Payer: Self-pay | Admitting: Student

## 2021-07-15 DIAGNOSIS — Z9889 Other specified postprocedural states: Secondary | ICD-10-CM

## 2021-07-20 NOTE — Progress Notes (Unsigned)
[place]  [phone] [phone]  ID: Rebecca [name] OB: Jun 28, 1960  MR#: [id number]  BUL#:[id number]  Patient Care Team: Delsa Grana, PA-C as PCP - General (Family Medicine) Bobetta Lime, MD as Referring Physician Byrnett, Forest Gleason, MD (General Surgery) Penni Bombard, MD as Consulting Physician (Neurology) Lloyd Huger, MD as Consulting Physician (Oncology) Leonie Man, MD as Consulting Physician (Cardiology) Minna Merritts, MD as Consulting Physician (Cardiology) Laneta Simmers as Physician Assistant (Urology) Cathi Roan, [facility] (Inactive) (Pharmacist)  CHIEF COMPLAINT: 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 BRCA negative.  INTERVAL HISTORY: Patient returns to clinic today for routine yearly evaluation.  She had a recent fall fracturing her left leg and has been in rehab for several months.  She otherwise feels well. She has no new neurologic complaints and her multiple sclerosis has been stable.  She does not complain of pain today. Her lower extremity edema is unchanged.  She denies any recent fevers or illnesses.  She denies any chest pain, shortness of breath, cough, or hemoptysis.  She denies any nausea, vomiting, constipation, or diarrhea.  She has no urinary complaints.  Patient offers no specific complaints today.  REVIEW OF SYSTEMS:   Review of Systems  Constitutional:  Negative for chills, fever and malaise/fatigue.  Respiratory:  Negative for cough and shortness of breath.   Cardiovascular:  Positive for leg swelling. Negative for chest pain.  Gastrointestinal:  Negative for abdominal pain, constipation, diarrhea, nausea and vomiting.  Genitourinary: Negative.  Negative for dysuria, frequency and urgency.  Musculoskeletal:  Positive for back pain.  Skin: Negative.  Negative for rash.  Neurological:  Positive for focal weakness. Negative for sensory change, weakness and  headaches.  Psychiatric/Behavioral: Negative.  The patient is not nervous/anxious.     As per HPI. Otherwise, a complete review of systems is negative.  PAST MEDICAL HISTORY: Past Medical History:  Diagnosis Date   Allergy    Aortic valve disease    Mild AS / AI - most recent Echo demonstrated tricuspid aortic valve.   Bacterial endocarditis    History of .   Bilateral lower extremity edema    Noncardiac.  Chronic. LE Venous dopplers - negative for DVT.; Echocardiogram January 2016: Normal EF with normal wall motion and valve function. Only grade 1 diastolic dysfunction. EF 60-65%. Mild MR   Breast cancer ([place]) 12/31/2013   Right breast, 12:00, 1.5 cm, T1c,N0 invasive mammary carcinoma, triple negative. --> Rx with Chemo   Cervical stenosis of spine    GERD (gastroesophageal reflux disease) April or May 2022   Heart murmur 2013   Herpes zoster    IBS (irritable bowel syndrome)    Lymphedema    has legs wrapped at [facility]   Multiple sclerosis ([place]) 2001   Walks from room to room @ home; but Wheelchair when going out.   Neuromuscular disorder ([place])    MS   PONV (postoperative nausea and vomiting)    Related to Fentanyl   Seizures ([place])    Takes Keppra   Syncope and collapse     PAST SURGICAL HISTORY: Past Surgical History:  Procedure Laterality Date   ANKLE SURGERY     Left   ANKLE SURGERY     ANTERIOR CERVICAL DECOMP/DISCECTOMY FUSION  11/17/2011   Procedure: ANTERIOR CERVICAL DECOMPRESSION/DISCECTOMY FUSION 2 LEVELS;  Surgeon: Erline Levine, MD;  Location: [place] [facility];  Service: Neurosurgery;  Laterality: N/A;  Cervical Five-Six Six-Seven Anterior  cervical decompression/diskectomy/fusion   BREAST BIOPSY Right 12/31/2013   invasive mammary   BREAST SURGERY Right 02/03/2014   Right simple mastectomy with sentinel node biopsy.   CHOLECYSTECTOMY     COLONOSCOPY  2014   ESOPHAGOGASTRODUODENOSCOPY (EGD) WITH PROPOFOL N/A 07/16/2020   Procedure: ESOPHAGOGASTRODUODENOSCOPY  (EGD) WITH PROPOFOL;  Surgeon: Virgel Manifold, MD;  Location: [facility] ENDOSCOPY;  Service: Endoscopy;  Laterality: N/A;  Patient has MS and will need assistance   HYSTEROSCOPY WITH D & C N/A 11/27/2018   Procedure: DILATATION AND CURETTAGE /HYSTEROSCOPY;  Surgeon: Gae Dry, MD;  Location: [facility] ORS;  Service: Gynecology;  Laterality: N/A;   Lower extremity venous Dopplers  02/27/2013   No LE DVT   MASTECTOMY Right 2015   ORIF TIBIA PLATEAU Left 12/09/2020   Procedure: LEFT OPEN REDUCTION INTERNAL FIXATION (ORIF) TIBIAL PLATEAU;  Surgeon: Shona Needles, MD;  Location: [place];  Service: Orthopedics;  Laterality: Left;   Port a cath insertion Right 01/19/2010   PORT-A-CATH REMOVAL     right   PORT-A-CATH REMOVAL Right 09/03/2013   Procedure: REMOVAL PORT-A-CATH;  Surgeon: Conrad Osborne, MD;  Location: [place];  Service: Vascular;  Laterality: Right;   SPINE SURGERY  Cervical steinois Dr. Vertell Limber 2014 I think?   TONSILLECTOMY     TRANSTHORACIC ECHOCARDIOGRAM  03/2013; 02/2014   a) Normal LV size and function with EF 60-65%.; Cannot exclude bicuspid aortic valve with mild AS and mild AI.; b) Normal EF with normal wall motion and valve function x Mild MR. G2 DD. EF 60-65%. Tricuspid AoV   UPPER GI ENDOSCOPY  2014    FAMILY HISTORY Family History  Problem Relation Age of Onset   Cancer Father        skin   Heart disease Father    Heart attack Father        heart attack in his 88's   Thyroid disease Sister    Ovarian cancer Cousin    Breast cancer Maternal Aunt 60   Breast cancer Maternal Grandmother 74   Bladder Cancer Neg Hx    Kidney cancer Neg Hx        ADVANCED DIRECTIVES:    HEALTH MAINTENANCE: Social History   Tobacco Use   Smoking status: Never   Smokeless tobacco: Never   Tobacco comments:    Never smoked. My mom smoked. Passed 04/18/20. Dad smoked, quit 1973. Sister smokes.  Vaping Use   Vaping Use: Never used  Substance Use Topics   Alcohol use: No   Drug use:  No     Colonoscopy:  PAP:  Bone density:  Lipid panel:  Allergies  Allergen Reactions   Fentanyl Nausea And Vomiting and Nausea Only    vomiting Was given in PACU x3 each time patient got sick    Sulfa Antibiotics Hives and Other (See Comments)    Light headed, over heated    Current Outpatient Medications  Medication Sig Dispense Refill   amantadine (SYMMETREL) 100 MG capsule TAKE 1 CAPSULE BY MOUTH THREE TIMES A DAY 270 capsule 4   baclofen (LIORESAL) 10 MG tablet TAKE 1 TO 2 TABLETS BY MOUTH 3 TIMES A DAY (Patient taking differently: 10-20 mg 3 (three) times daily.) 540 tablet 0   bisacodyl (DULCOLAX) 5 MG EC tablet Take 1 tablet (5 mg total) by mouth daily as needed for moderate constipation. 30 tablet 0   Disposable Gloves (ASSURANCE VINYL EXAM GLOVES) MISC Neurogenic bladder; MS; LON 99 months; for use for personal  hygiene 50 each 11   feeding supplement (ENSURE ENLIVE / ENSURE PLUS) LIQD Take 237 mLs by mouth daily. 237 mL 12   gluconic acid-citric acid (RENACIDIN) irrigation Insert 30 mL into suprapubic tube and clamp tube for 10 minutes and then drain twice daily 500 mL 1   Glucosamine-Chondroitin (COSAMIN DS PO) Take 1 tablet by mouth 5 (five) times daily.     ibuprofen (ADVIL) 600 MG tablet TAKE 1 TABLET BY MOUTH EVERY 8 HOURS AS NEEDED. (Patient taking differently: Take 600 mg by mouth every 8 (eight) hours as needed for moderate pain, mild pain or headache.) 90 tablet 3   levETIRAcetam (KEPPRA) 500 MG tablet Take 1 tablet (500 mg total) by mouth 2 (two) times daily. 180 tablet 4   lidocaine-prilocaine (EMLA) cream Apply 1 application topically as needed. 30 g 0   lipase/protease/amylase (CREON) 36000 UNITS CPEP capsule TAKE 1 CAPSULE BY MOUTH WITH BREAKFAST, WITH LUNCH, AND WITH EVENING MEAL AND 1 CAPSULE WITH SNACKS. (Patient taking differently: Take 36,000 Units by mouth 3 (three) times daily with meals.) 540 capsule 2   loratadine (CLARITIN) 10 MG tablet Take 10 mg by  mouth daily.     Misc Natural Products (LEG VEIN & CIRCULATION) TABS Take 1 tablet by mouth 2 (two) times daily.      Multiple Vitamins-Minerals (HAIR SKIN AND NAILS FORMULA) TABS Take 1 tablet by mouth 2 (two) times daily.      Multiple Vitamins-Minerals (MULTIVITAMIN PO) Take 1 tablet by mouth daily.      nystatin (MYCOSTATIN/NYSTOP) powder Apply 1 application topically 3 (three) times daily as needed. For rash/raw skin as needed 15 g 2   omeprazole (PRILOSEC) 40 MG capsule TAKE 1 CAPSULE (40 MG TOTAL) BY MOUTH DAILY. (Patient taking differently: Take 40 mg by mouth daily. Marland Kitchen) 30 capsule 0   oxybutynin (DITROPAN-XL) 10 MG 24 hr tablet TAKE 1 TABLET BY MOUTH TWICE A DAY (Patient taking differently: Take 10 mg by mouth in the morning and at bedtime.) 180 tablet 4   polyethylene glycol (MIRALAX / GLYCOLAX) 17 g packet Take 17 g by mouth daily.     REBIF REBIDOSE 44 MCG/0.5ML SOAJ Inject  44 mcg Sub-q 3 times weekly Rotate site after each injection 12 mL 3   tiZANidine (ZANAFLEX) 4 MG tablet TAKE 1 TABLET (4 MG TOTAL) BY MOUTH 2 (TWO) TIMES DAILY. (Patient taking differently: Take 4 mg by mouth in the morning and at bedtime.) 180 tablet 1   Turmeric 500 MG CAPS Take 500 mg by mouth daily.     docusate sodium (COLACE) 100 MG capsule Take 1 capsule (100 mg total) by mouth 2 (two) times daily. (Patient not taking: Reported on 07/21/2021) 10 capsule 0   No current facility-administered medications for this visit.    OBJECTIVE: Vitals:   07/21/21 1026  BP: 121/77  Pulse: 69  Resp: 17  Temp: (!) 95 F (35 C)  SpO2: 99%     There is no height or weight on file to calculate BMI.    ECOG FS:2 - Symptomatic, <50% confined to bed  General: Well-developed, well-nourished, no acute distress.  Sitting in a wheelchair.  Eyes: Pink conjunctiva, anicteric sclera. HEENT: Normocephalic, moist mucous membranes. Lungs: No audible wheezing or coughing. Heart: Regular rate and rhythm. Abdomen: Soft, nontender,  no obvious distention. Musculoskeletal: 2-3+ bilateral lower extremity edema. Neuro: Alert, answering all questions appropriately. Cranial nerves grossly intact. Skin: No rashes or petechiae noted. Psych: Normal affect.   LAB  RESULTS:  Lab Results  Component Value Date   NA 138 12/18/2020   K 4.0 12/18/2020   CL 101 12/18/2020   CO2 31 12/18/2020   GLUCOSE 106 (H) 12/18/2020   BUN 9 12/18/2020   CREATININE 0.58 12/18/2020   CALCIUM 8.3 (L) 12/18/2020   PROT 5.7 (L) 12/18/2020   ALBUMIN 2.4 (L) 12/18/2020   AST 33 12/18/2020   ALT 50 (H) 12/18/2020   ALKPHOS 170 (H) 12/18/2020   BILITOT 0.6 12/18/2020   GFRNONAA >60 12/18/2020   GFRAA 117 01/18/2019    Lab Results  Component Value Date   WBC 4.3 12/18/2020   NEUTROABS 2.2 12/18/2020   HGB 7.5 (L) 12/18/2020   HCT 23.3 (L) 12/18/2020   MCV 95.1 12/18/2020   PLT 210 12/18/2020      STUDIES: No results found.   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 BRCA negative.  PLAN:    1. Triple negative right breast cancer: No evidence of disease. Given her difficulties with Taxol and multiple sclerosis, treatment was discontinued after a total of 6 of 12 cycles of Taxol. Her last chemotherapy was July 17, 2014.  Because patient had a full mastectomy, she did not require adjuvant XRT. Given the fact that she is a triple negative cancer, she does not require an aromatase inhibitor.  Patient's most recent left screening mammogram on September 18, 2020 was reported as BI-RADS 1.  Repeat in August 2023.  CA 27-29 continues to be within normal limits, today's result is pending.  Continue yearly visits through 2025.   2. Multiple sclerosis: Continue evaluation and current treatment per neurology. Her primary neurologist is Dr. Andrey Spearman.  phone [phone], fax [phone]. 3. Lymphedema: Chronic and unchanged.  Continue treatment and wraps as prescribed. 4. Peripheral neuropathy: Patient does not  complain of this today. 5. Pain: Patient is no longer taking narcotics. 6.  Thrombocytopenia: Resolved. 7.  Foley catheter: Chronic and unchanged. 8.  Broken left leg: Continue rehab as needed.    Patient expressed understanding and was in agreement with this plan. She also understands that She can call clinic at any time with any questions, concerns, or complaints.   Breast cancer   Staging form: Breast, AJCC 7th Edition     Pathologic stage from 05/24/2014: Stage IA (T1c, N0, cM0) - Signed by Lloyd Huger, MD on 05/24/2014   Lloyd Huger, MD   07/21/2021 12:13 PM

## 2021-07-21 ENCOUNTER — Inpatient Hospital Stay (HOSPITAL_BASED_OUTPATIENT_CLINIC_OR_DEPARTMENT_OTHER): Payer: Medicare Other | Admitting: Oncology

## 2021-07-21 ENCOUNTER — Inpatient Hospital Stay: Payer: Medicare Other | Attending: Oncology

## 2021-07-21 ENCOUNTER — Encounter: Payer: Self-pay | Admitting: Oncology

## 2021-07-21 VITALS — BP 121/77 | HR 69 | Temp 95.0°F | Resp 17

## 2021-07-21 DIAGNOSIS — Z452 Encounter for adjustment and management of vascular access device: Secondary | ICD-10-CM | POA: Diagnosis not present

## 2021-07-21 DIAGNOSIS — Z853 Personal history of malignant neoplasm of breast: Secondary | ICD-10-CM | POA: Diagnosis present

## 2021-07-21 DIAGNOSIS — I890 Lymphedema, not elsewhere classified: Secondary | ICD-10-CM | POA: Diagnosis not present

## 2021-07-21 DIAGNOSIS — Z95828 Presence of other vascular implants and grafts: Secondary | ICD-10-CM

## 2021-07-21 DIAGNOSIS — C50911 Malignant neoplasm of unspecified site of right female breast: Secondary | ICD-10-CM | POA: Diagnosis not present

## 2021-07-21 DIAGNOSIS — Z8041 Family history of malignant neoplasm of ovary: Secondary | ICD-10-CM | POA: Insufficient documentation

## 2021-07-21 DIAGNOSIS — Z803 Family history of malignant neoplasm of breast: Secondary | ICD-10-CM | POA: Insufficient documentation

## 2021-07-21 DIAGNOSIS — G35 Multiple sclerosis: Secondary | ICD-10-CM | POA: Insufficient documentation

## 2021-07-21 MED ORDER — SODIUM CHLORIDE 0.9% FLUSH
10.0000 mL | Freq: Once | INTRAVENOUS | Status: AC
  Administered 2021-07-21: 10 mL via INTRAVENOUS
  Filled 2021-07-21: qty 10

## 2021-07-21 MED ORDER — HEPARIN SOD (PORK) LOCK FLUSH 100 UNIT/ML IV SOLN
500.0000 [IU] | Freq: Once | INTRAVENOUS | Status: AC
  Administered 2021-07-21: 500 [IU] via INTRAVENOUS
  Filled 2021-07-21: qty 5

## 2021-07-21 NOTE — Progress Notes (Signed)
Patient here for oncology follow-up appointment, expresses no new complaints or concerns at this time.   

## 2021-07-26 ENCOUNTER — Ambulatory Visit
Admission: RE | Admit: 2021-07-26 | Discharge: 2021-07-26 | Disposition: A | Discharge status: Home / Self Care | Payer: Medicare Other | Source: Ambulatory Visit | Attending: Student | Admitting: Student

## 2021-07-26 DIAGNOSIS — Z9889 Other specified postprocedural states: Secondary | ICD-10-CM | POA: Insufficient documentation

## 2021-07-26 DIAGNOSIS — Z8781 Personal history of (healed) traumatic fracture: Secondary | ICD-10-CM | POA: Insufficient documentation

## 2021-08-09 ENCOUNTER — Telehealth: Payer: Self-pay | Admitting: Oncology

## 2021-08-09 NOTE — Telephone Encounter (Signed)
Last port flush was 6/14. Ok to schedule after 8/9 when it is due.

## 2021-08-09 NOTE — Telephone Encounter (Signed)
pt called in req next port flush appts, currently none are scheduled.please advise/ contact pt with further information

## 2021-08-17 ENCOUNTER — Encounter: Payer: Self-pay | Admitting: Family Medicine

## 2021-08-17 ENCOUNTER — Ambulatory Visit (INDEPENDENT_AMBULATORY_CARE_PROVIDER_SITE_OTHER): Payer: Medicare Other | Admitting: Family Medicine

## 2021-08-17 DIAGNOSIS — Z0000 Encounter for general adult medical examination without abnormal findings: Secondary | ICD-10-CM

## 2021-08-17 NOTE — Progress Notes (Addendum)
Annual Wellness Visit Telehealth visit via Phone Time spent with patient: 26 minutes   Patient: Rebecca [name], Female    DOB: [birth year], 61 y.o.   MRN: [id number]  Subjective  Chief Complaint  Patient presents with   Medicare Wellness    Rebecca [name] is a 61 y.o. female who presents today for her Annual Wellness Visit. She reports consuming a  heart healthy, low sodium  diet. Exercise is limited by neurologic condition(s): MS. She generally feels fairly well. She reports sleeping well. She does not have additional problems to discuss today.   Rehab facility after 12/2020. Working with PT, still wheelchair bound.   Vision:Within last year and Dental: Current dental problems and Last dental visit: 6 months ago at rehab facility.   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   Bilateral lower extremity pain (primary) (bilateral) (right greater than left) 08/01/2016   Chronic neck pain (secondary) (bilateral) ( left greater than right) 07/28/2016   Neutropenia ([place]) 06/27/2016   Chronic pain syndrome 06/03/2016   Neurogenic bladder    Neurogenic bowel    Detrusor and sphincter dyssynergia    Urinary incontinence    Seizure disorder ([place])    Slow transit constipation    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[place])    Lymphedema    Encounter for screening for cervical cancer 11/27/2015   Preventative health care 11/25/2015   History of breast  cancer in female 10/28/2015   Anemia 10/28/2015   Thrombocytopenia ([place]) 10/28/2015   Allergic rhinitis 10/28/2015   IBS (irritable bowel syndrome) 10/28/2015   Uterine leiomyoma 10/24/2014   History of right mastectomy 10/24/2014   Medicare annual wellness visit, subsequent 10/24/2014   MS (multiple sclerosis) ([place]) 95/63/8756   Hematoma complicating a procedure 03/06/2014   Primary cancer of right female breast ([place]) 01/10/2014   SOB (shortness of breath) 03/01/2013   Bilateral lower extremity edema    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      Medications: Outpatient Medications Prior to Visit  Medication Sig   amantadine (SYMMETREL) 100 MG capsule TAKE 1 CAPSULE BY MOUTH THREE TIMES A DAY   baclofen (LIORESAL) 10 MG tablet TAKE 1 TO 2 TABLETS BY MOUTH 3 TIMES A DAY (Patient taking differently: 10-20 mg 3 (three) times daily.)   Disposable Gloves (ASSURANCE VINYL EXAM GLOVES) MISC Neurogenic bladder; MS; LON 99 months; for use for personal hygiene   gluconic acid-citric acid (RENACIDIN) irrigation Insert 30 mL into suprapubic tube and clamp tube for 10 minutes and then drain twice daily   Glucosamine-Chondroitin (COSAMIN DS PO) Take 1 tablet by mouth 5 (five) times daily.   ibuprofen (ADVIL) 600 MG tablet TAKE 1 TABLET BY MOUTH EVERY 8 HOURS AS NEEDED. (Patient taking differently: Take 600 mg by mouth every 8 (eight) hours as needed for moderate pain, mild pain or headache.)   levETIRAcetam (KEPPRA) 500 MG tablet Take 1 tablet (500 mg total) by mouth 2 (two) times daily.  lidocaine-prilocaine (EMLA) cream Apply 1 application topically as needed.   lipase/protease/amylase (CREON) 36000 UNITS CPEP capsule TAKE 1 CAPSULE BY MOUTH WITH BREAKFAST, WITH LUNCH, AND WITH EVENING MEAL AND 1 CAPSULE WITH SNACKS. (Patient taking differently: Take 36,000 Units by mouth 3 (three) times daily with meals.)   loratadine (CLARITIN) 10 MG tablet Take 10 mg by mouth daily.   Misc Natural  Products (LEG VEIN & CIRCULATION) TABS Take 1 tablet by mouth 2 (two) times daily.    Multiple Vitamins-Minerals (HAIR SKIN AND NAILS FORMULA) TABS Take 1 tablet by mouth 2 (two) times daily.    Multiple Vitamins-Minerals (MULTIVITAMIN PO) Take 1 tablet by mouth daily.    nystatin (MYCOSTATIN/NYSTOP) powder Apply 1 application topically 3 (three) times daily as needed. For rash/raw skin as needed   oxybutynin (DITROPAN-XL) 10 MG 24 hr tablet TAKE 1 TABLET BY MOUTH TWICE A DAY (Patient taking differently: Take 10 mg by mouth in the morning and at bedtime.)   polyethylene glycol (MIRALAX / GLYCOLAX) 17 g packet Take 17 g by mouth daily.   REBIF REBIDOSE 44 MCG/0.5ML SOAJ Inject  44 mcg Sub-q 3 times weekly Rotate site after each injection   tiZANidine (ZANAFLEX) 4 MG tablet TAKE 1 TABLET (4 MG TOTAL) BY MOUTH 2 (TWO) TIMES DAILY. (Patient taking differently: Take 4 mg by mouth in the morning and at bedtime.)   Turmeric 500 MG CAPS Take 500 mg by mouth daily.   [DISCONTINUED] bisacodyl (DULCOLAX) 5 MG EC tablet Take 1 tablet (5 mg total) by mouth daily as needed for moderate constipation.   [DISCONTINUED] feeding supplement (ENSURE ENLIVE / ENSURE PLUS) LIQD Take 237 mLs by mouth daily.   omeprazole (PRILOSEC) 40 MG capsule TAKE 1 CAPSULE (40 MG TOTAL) BY MOUTH DAILY. (Patient taking differently: Take 40 mg by mouth daily. Marland Kitchen)   [DISCONTINUED] docusate sodium (COLACE) 100 MG capsule Take 1 capsule (100 mg total) by mouth 2 (two) times daily. (Patient not taking: Reported on 07/21/2021)   No facility-administered medications prior to visit.    Allergies  Allergen Reactions   Fentanyl Nausea And Vomiting and Nausea Only    vomiting Was given in PACU x3 each time patient got sick    Sulfa Antibiotics Hives and Other (See Comments)    Light headed, over heated    Patient Care Team: Delsa Grana, PA-C as PCP - General (Family Medicine) Bobetta Lime, MD as Referring Physician Byrnett, Forest Gleason, MD (General Surgery) Penni Bombard, MD as Consulting Physician (Neurology) Lloyd Huger, MD as Consulting Physician (Oncology) Leonie Man, MD as Consulting Physician (Cardiology) Minna Merritts, MD as Consulting Physician (Cardiology) Laneta Simmers as Physician Assistant (Urology) Cathi Roan, [facility] (Inactive) (Pharmacist) [facility]       Objective  There were no vitals taken for this visit.   Physical Exam Entirety of visit conducted over the phone. Speaks in full sentences, no respiratory distress.    Most recent functional status assessment:    08/17/2021    1:21 PM  In your present state of health, do you have any difficulty performing the following activities:  Hearing? 0  Vision? 0  Difficulty concentrating or making decisions? 0  Walking or climbing stairs? 1  Dressing or bathing? 0  Doing errands, shopping? 0   Most recent fall risk assessment:    08/17/2021    1:21 PM  Fall Risk   Falls in the past year? 0  Number falls in  past yr: 0  Injury with Fall? 0  Risk for fall due to : Impaired mobility  Follow up Falls prevention discussed    Most recent depression screenings:    08/17/2021    1:22 PM 08/13/2020    2:43 PM  PHQ 2/9 Scores  PHQ - 2 Score 0 0  PHQ- 9 Score 0    Most recent cognitive screening:    08/17/2021    2:26 PM  6CIT Screen  What Year? 0 points  What month? 0 points  What time? 0 points  Count back from 20 0 points  Months in reverse 0 points  Repeat phrase 0 points  Total Score 0 points   Most recent Audit-C alcohol use screening    08/17/2021    2:24 PM  Alcohol Use Disorder Test (AUDIT)  1. How often do you have a drink containing alcohol? 0  2. How many drinks containing alcohol do you have on a typical day when you are drinking? 0  3. How often do you have six or more drinks on one occasion? 0  AUDIT-C Score 0   A score of 3 or more in women, and 4 or more in men  indicates increased risk for alcohol abuse, EXCEPT if all of the points are from question 1   Vision/Hearing Screen: No results found.    No results found for any visits on 08/17/21.    Assessment & Plan   Annual wellness visit done today including the all of the following: Reviewed patient's Family Medical History Reviewed and updated list of patient's medical providers Assessment of cognitive impairment was done Assessed patient's functional ability Established a written schedule for health screening [place] Completed and Reviewed  Advanced Care Planning: A voluntary discussion about advance care planning including the explanation and discussion of advance directives.  Discussed health care proxy and Living will, and the patient was able to identify a health care proxy as husband, Graciela Plato.  Patient does not have a living will at present time. If patient does have living will, I have requested they bring this to the clinic to be scanned in to their chart.  Exercise Activities and Dietary recommendations  Goals      Patient Stated     Patient states she would like to improve gait and balance with strength exercises, physical therapy and use of braces.          Immunization History  Administered Date(s) Administered   Influenza,inj,Quad PF,6+ Mos 10/24/2014, 11/25/2015, 10/28/2016, 12/26/2017, 11/20/2018, 12/01/2020   Moderna Sars-Covid-2 Vaccination 07/02/2019, 07/31/2019   Pneumococcal Polysaccharide-23 02/03/2018   Tdap 04/09/2012   Unspecified SARS-COV-2 Vaccination 06/07/2021    Health Maintenance  Topic Date Due   Zoster Vaccines- Shingrix (1 of 2) Never done   COVID-19 Vaccine (4 - Booster) 08/02/2021   PAP SMEAR-Modifier  09/25/2021   INFLUENZA VACCINE  09/07/2021   MAMMOGRAM  09/18/2021   COLONOSCOPY (Pts 45-59yr Insurance coverage will need to be confirmed)  02/07/2022   TETANUS/TDAP  04/10/2022   Hepatitis C Screening  Completed    HIV Screening  Completed   HPV VACCINES  Aged Out     Discussed health benefits of physical activity, and encouraged her to engage in regular exercise appropriate for her age and condition.    Problem List Items Addressed This Visit   None Visit Diagnoses     Encounter for Medicare annual wellness exam    -  Primary  Return in about 1 year (around 08/18/2022) for medicare wellness.     Myles Gip, DO

## 2021-08-17 NOTE — Patient Instructions (Signed)
  Ms. Sianez , Thank you for taking time to come for your Medicare Wellness Visit. I appreciate your ongoing commitment to your health goals. Please review the following plan we discussed and let me know if I can assist you in the future.   These are the goals we discussed: - Make an appointment with your cardiologist, neurologist, and urologist once you are discharged from your rehab facility. - Get your shingles vaccine from the pharmacy.   Goals      Patient Stated     Patient states she would like to improve gait and balance with strength exercises, physical therapy and use of braces.          This is a list of the screening recommended for you and due dates:  Health Maintenance  Topic Date Due   Zoster (Shingles) Vaccine (1 of 2) Never done   COVID-19 Vaccine (4 - Booster) 08/02/2021   Pap Smear  09/25/2021   Flu Shot  09/07/2021   Mammogram  09/18/2021   Colon Cancer Screening  02/07/2022   Tetanus Vaccine  04/10/2022   Hepatitis C Screening: USPSTF Recommendation to screen - Ages 18-79 yo.  Completed   HIV Screening  Completed   HPV Vaccine  Aged Out

## 2021-09-16 ENCOUNTER — Inpatient Hospital Stay: Payer: Medicare Other | Attending: Oncology

## 2021-09-16 DIAGNOSIS — Z853 Personal history of malignant neoplasm of breast: Secondary | ICD-10-CM | POA: Diagnosis present

## 2021-09-16 DIAGNOSIS — Z95828 Presence of other vascular implants and grafts: Secondary | ICD-10-CM

## 2021-09-16 DIAGNOSIS — Z452 Encounter for adjustment and management of vascular access device: Secondary | ICD-10-CM | POA: Insufficient documentation

## 2021-09-16 MED ORDER — SODIUM CHLORIDE 0.9% FLUSH
10.0000 mL | Freq: Once | INTRAVENOUS | Status: AC
  Administered 2021-09-16: 10 mL via INTRAVENOUS
  Filled 2021-09-16: qty 10

## 2021-09-16 MED ORDER — HEPARIN SOD (PORK) LOCK FLUSH 100 UNIT/ML IV SOLN
500.0000 [IU] | Freq: Once | INTRAVENOUS | Status: AC
  Administered 2021-09-16: 500 [IU] via INTRAVENOUS
  Filled 2021-09-16: qty 5

## 2021-09-16 NOTE — Patient Instructions (Signed)
[facility]  Discharge Instructions: Thank you for choosing [facility] to provide your oncology and hematology care.  If you have a lab appointment with the Cancer Center, please go directly to the Cancer Center and check in at the registration area.  Wear comfortable clothing and clothing appropriate for easy access to any Portacath or PICC line.   We strive to give you quality time with your provider. You may need to reschedule your appointment if you arrive late (15 or more minutes).  Arriving late affects you and other patients whose appointments are after yours.  Also, if you miss three or more appointments without notifying the office, you may be dismissed from the clinic at the provider's discretion.      For prescription refill requests, have your pharmacy contact our office and allow 72 hours for refills to be completed.    Today you received the following chemotherapy and/or immunotherapy agents port flush       To help prevent nausea and vomiting after your treatment, we encourage you to take your nausea medication as directed.  BELOW ARE SYMPTOMS THAT SHOULD BE REPORTED IMMEDIATELY: *FEVER GREATER THAN 100.4 F (38 C) OR HIGHER *CHILLS OR SWEATING *NAUSEA AND VOMITING THAT IS NOT CONTROLLED WITH YOUR NAUSEA MEDICATION *UNUSUAL SHORTNESS OF BREATH *UNUSUAL BRUISING OR BLEEDING *URINARY PROBLEMS (pain or burning when urinating, or frequent urination) *BOWEL PROBLEMS (unusual diarrhea, constipation, pain near the anus) TENDERNESS IN MOUTH AND THROAT WITH OR WITHOUT PRESENCE OF ULCERS (sore throat, sores in mouth, or a toothache) UNUSUAL RASH, SWELLING OR PAIN  UNUSUAL VAGINAL DISCHARGE OR ITCHING   Items with * indicate a potential emergency and should be followed up as soon as possible or go to the Emergency Department if any problems should occur.  Please show the CHEMOTHERAPY ALERT CARD or IMMUNOTHERAPY ALERT CARD at check-in  to the Emergency Department and triage nurse.  Should you have questions after your visit or need to cancel or reschedule your appointment, please contact [facility]  [phone] and follow the prompts.  Office hours are 8:00 a.m. to 4:30 p.m. Monday - Friday. Please note that voicemails left after 4:00 p.m. may not be returned until the following business day.  We are closed weekends and major holidays. You have access to a nurse at all times for urgent questions. Please call the main number to the clinic [phone] and follow the prompts.  For any non-urgent questions, you may also contact your provider using MyChart. We now offer e-Visits for anyone 18 and older to request care online for non-urgent symptoms. For details visit [email or web address].   Also download the MyChart app! Go to the app store, search "MyChart", open the app, select [facility], and log in with your MyChart username and password.  Masks are optional in the cancer centers. If you would like for your care team to wear a mask while they are taking care of you, please let them know. For doctor visits, patients may have with them one support person who is at least 61 years old. At this time, visitors are not allowed in the infusion area.   

## 2021-09-27 ENCOUNTER — Ambulatory Visit
Admission: RE | Admit: 2021-09-27 | Discharge: 2021-09-27 | Disposition: A | Discharge status: Home / Self Care | Payer: Medicare Other | Source: Ambulatory Visit | Attending: Oncology | Admitting: Oncology

## 2021-09-27 DIAGNOSIS — Z1231 Encounter for screening mammogram for malignant neoplasm of breast: Secondary | ICD-10-CM | POA: Diagnosis not present

## 2021-09-27 DIAGNOSIS — C50911 Malignant neoplasm of unspecified site of right female breast: Secondary | ICD-10-CM | POA: Diagnosis present

## 2021-09-28 ENCOUNTER — Other Ambulatory Visit: Payer: Self-pay | Admitting: Oncology

## 2021-09-28 DIAGNOSIS — R928 Other abnormal and inconclusive findings on diagnostic imaging of breast: Secondary | ICD-10-CM

## 2021-09-28 DIAGNOSIS — N6489 Other specified disorders of breast: Secondary | ICD-10-CM

## 2021-10-06 ENCOUNTER — Encounter (HOSPITAL_COMMUNITY): Payer: Self-pay

## 2021-10-06 ENCOUNTER — Emergency Department (HOSPITAL_COMMUNITY)
Admission: EM | Admit: 2021-10-06 | Discharge: 2021-10-06 | Disposition: A | Discharge status: Skilled Nursing Facility | Payer: Medicare Other | Attending: Emergency Medicine | Admitting: Emergency Medicine

## 2021-10-06 ENCOUNTER — Other Ambulatory Visit: Payer: Self-pay

## 2021-10-06 DIAGNOSIS — T83028A Displacement of other indwelling urethral catheter, initial encounter: Secondary | ICD-10-CM | POA: Diagnosis present

## 2021-10-06 DIAGNOSIS — T83021A Displacement of indwelling urethral catheter, initial encounter: Secondary | ICD-10-CM

## 2021-10-06 DIAGNOSIS — Y732 Prosthetic and other implants, materials and accessory gastroenterology and urology devices associated with adverse incidents: Secondary | ICD-10-CM | POA: Insufficient documentation

## 2021-10-06 DIAGNOSIS — Z853 Personal history of malignant neoplasm of breast: Secondary | ICD-10-CM | POA: Insufficient documentation

## 2021-10-06 NOTE — ED Provider Notes (Signed)
[facility] EMERGENCY DEPARTMENT Provider Note   CSN: [id number] Arrival date & time: 10/06/21  1024     History  Chief Complaint  Patient presents with   foley issue    Rebecca [name] is a 61 y.o. female.  Rebecca [name] is a 61 y.o. female with a history of MS, seizures, IBS, endocarditis, breast cancer, who presents to the emergency department via EMS from [facility] skilled nursing facility due to Foley catheter issues.  She reports last night staff were cleaning her after an episode of diarrhea and accidentally pulled out her Foley catheter.  She reports that she has had a catheter in place since 2019 due to incontinence in the setting of her MS.  She reports that she has very small bladder and otherwise has to be changed very frequently.  She cannot control her bladder in particular when she gets up.  Patient and her urologist are working towards likely using a suprapubic catheter.  She reports that she typically has a 72 [place] and with 5 cc of water instilled into the balloon.  She reports that at the facility they did not have any 16 [place] catheters in stock.  She has been using a brief since catheter came out and has been incontinent of urine several times.  Transported for replacement of Foley catheter.  No fevers, burning or pain with urination or other issues or complaints.  The history is provided by the patient and medical records.       Home Medications Prior to Admission medications   Medication Sig Start Date End Date Taking? Authorizing Provider  amantadine (SYMMETREL) 100 MG capsule TAKE 1 CAPSULE BY MOUTH THREE TIMES A DAY 01/19/21   Penumalli, Earlean Polka, MD  baclofen (LIORESAL) 10 MG tablet TAKE 1 TO 2 TABLETS BY MOUTH 3 TIMES A DAY Patient taking differently: 10-20 mg 3 (three) times daily. 10/26/20   Penumalli, Earlean Polka, MD  Disposable Gloves (ASSURANCE VINYL EXAM GLOVES) MISC Neurogenic bladder; MS; LON 99 months; for use for personal hygiene  12/26/17   [name], MD  gluconic acid-citric acid (RENACIDIN) irrigation Insert 30 mL into suprapubic tube and clamp tube for 10 minutes and then drain twice daily 04/16/21   McGowan, Larene Beach A, PA-C  Glucosamine-Chondroitin (COSAMIN DS PO) Take 1 tablet by mouth 5 (five) times daily.    [provider]  ibuprofen (ADVIL) 600 MG tablet TAKE 1 TABLET BY MOUTH EVERY 8 HOURS AS NEEDED. Patient taking differently: Take 600 mg by mouth every 8 (eight) hours as needed for moderate pain, mild pain or headache. 07/27/20   Delsa Grana, PA-C  levETIRAcetam (KEPPRA) 500 MG tablet Take 1 tablet (500 mg total) by mouth 2 (two) times daily. 12/16/19   Penumalli, Earlean Polka, MD  lidocaine-prilocaine (EMLA) cream Apply 1 application topically as needed. 09/20/19   Lloyd Huger, MD  lipase/protease/amylase (CREON) 36000 UNITS CPEP capsule TAKE 1 CAPSULE BY MOUTH WITH BREAKFAST, WITH LUNCH, AND WITH EVENING MEAL AND 1 CAPSULE WITH SNACKS. Patient taking differently: Take 36,000 Units by mouth 3 (three) times daily with meals. 10/22/20   Virgel Manifold, MD  loratadine (CLARITIN) 10 MG tablet Take 10 mg by mouth daily.    [provider]  Misc Natural Products (LEG VEIN & CIRCULATION) TABS Take 1 tablet by mouth 2 (two) times daily.     [provider]  Multiple Vitamins-Minerals (HAIR SKIN AND NAILS FORMULA) TABS Take 1 tablet by mouth 2 (two)  times daily.     [provider]  Multiple Vitamins-Minerals (MULTIVITAMIN PO) Take 1 tablet by mouth daily.     [provider]  nystatin (MYCOSTATIN/NYSTOP) powder Apply 1 application topically 3 (three) times daily as needed. For rash/raw skin as needed 07/31/19   Delsa Grana, PA-C  omeprazole (PRILOSEC) 40 MG capsule TAKE 1 CAPSULE (40 MG TOTAL) BY MOUTH DAILY. Patient taking differently: Take 40 mg by mouth daily. . 09/09/20 07/21/21  Virgel Manifold, MD  oxybutynin (DITROPAN-XL) 10 MG 24 hr tablet TAKE 1 TABLET  BY MOUTH TWICE A DAY Patient taking differently: Take 10 mg by mouth in the morning and at bedtime. 11/18/20   Penumalli, Earlean Polka, MD  polyethylene glycol (MIRALAX / GLYCOLAX) 17 g packet Take 17 g by mouth daily.    [provider]  REBIF REBIDOSE 44 MCG/0.5ML SOAJ Inject  44 mcg Sub-q 3 times weekly Rotate site after each injection 02/09/21   Penumalli, Vikram R, MD  tiZANidine (ZANAFLEX) 4 MG tablet TAKE 1 TABLET (4 MG TOTAL) BY MOUTH 2 (TWO) TIMES DAILY. Patient taking differently: Take 4 mg by mouth in the morning and at bedtime. 09/05/19   Jamse Arn, MD  Turmeric 500 MG CAPS Take 500 mg by mouth daily.    [provider]      Allergies    Fentanyl and Sulfa antibiotics    Review of Systems   Review of Systems  Constitutional:  Negative for chills and fever.  Gastrointestinal:  Negative for abdominal pain.  Genitourinary:  Negative for dysuria and flank pain.    Physical Exam Updated Vital Signs BP (!) 154/84 (BP Location: Right Arm)   Pulse 88   Temp 97.8 F (36.6 C) (Oral)   Resp 16   SpO2 97%  Physical Exam Vitals and nursing note reviewed.  Constitutional:      General: She is not in acute distress.    Appearance: Normal appearance. She is well-developed. She is not ill-appearing or diaphoretic.  HENT:     Head: Normocephalic and atraumatic.  Eyes:     General:        Right eye: No discharge.        Left eye: No discharge.  Cardiovascular:     Rate and Rhythm: Normal rate and regular rhythm.  Pulmonary:     Effort: Pulmonary effort is normal. No respiratory distress.     Breath sounds: Normal breath sounds.  Abdominal:     General: Bowel sounds are normal. There is no distension.     Palpations: Abdomen is soft.     Tenderness: There is no abdominal tenderness.  Musculoskeletal:        General: No deformity.  Skin:    General: Skin is warm and dry.  Neurological:     Mental Status: She is alert and oriented to person, place, and  time.     Coordination: Coordination normal.  Psychiatric:        Mood and Affect: Mood normal.        Behavior: Behavior normal.     ED Results / Procedures / Treatments   Labs (all labs ordered are listed, but only abnormal results are displayed) Labs Reviewed - No data to display  EKG None  Radiology No results found.  Procedures Procedures    Medications Ordered in ED Medications - No data to display  ED Course/ Medical Decision Making/ A&P  Medical Decision Making  61 year old female presents to the ED from [facility] skilled nursing facility after her Foley catheter was accidentally removed.  They did not have the correct catheter to replace it.  Patient is incontinent at baseline.  No urinary retention, has been voiding into a brief since catheter was removed but has to be changed very frequently.  22 French catheter replaced successfully.  At this time patient is stable for discharge back to skilled nursing facility.  At this time there does not appear to be any evidence of an acute emergency medical condition requiring further emergent evaluation and the patient appears stable for discharge with appropriate outpatient follow up. Diagnosis and return precautions discussed with patient who verbalizes understanding and is agreeable to discharge.           Final Clinical Impression(s) / ED Diagnoses Final diagnoses:  Displacement of Foley catheter, initial encounter [facility])    Rx / [place] Orders ED Discharge Orders     None         Janet Berlin 10/06/21 1326    Davonna Belling, MD 10/07/21 [phone]

## 2021-10-06 NOTE — ED Triage Notes (Signed)
Pt BIB [facility] from [facility] facility d/t catheter issues. Per Staff, as they were cleaning the pt last night , they accidentally pulled out her foley and did not have the correct the size for her at the facility... A&O X4.   138/98 HR 85  RR 18  97% RA

## 2021-10-06 NOTE — Discharge Instructions (Signed)
Your catheter was replaced, please follow-up with your primary care doctor and urologist as planned.

## 2021-10-21 ENCOUNTER — Inpatient Hospital Stay: Admission: RE | Admit: 2021-10-21 | Payer: Medicare Other | Source: Ambulatory Visit

## 2021-10-21 ENCOUNTER — Other Ambulatory Visit: Payer: Medicare Other

## 2021-11-08 ENCOUNTER — Ambulatory Visit
Admission: RE | Admit: 2021-11-08 | Discharge: 2021-11-08 | Disposition: A | Discharge status: Home / Self Care | Payer: Medicare Other | Source: Ambulatory Visit | Attending: Oncology | Admitting: Oncology

## 2021-11-08 DIAGNOSIS — N6489 Other specified disorders of breast: Secondary | ICD-10-CM

## 2021-11-08 DIAGNOSIS — R928 Other abnormal and inconclusive findings on diagnostic imaging of breast: Secondary | ICD-10-CM

## 2021-11-11 ENCOUNTER — Inpatient Hospital Stay: Payer: Medicare Other | Attending: Oncology

## 2021-11-11 DIAGNOSIS — Z95828 Presence of other vascular implants and grafts: Secondary | ICD-10-CM

## 2021-11-11 DIAGNOSIS — Z452 Encounter for adjustment and management of vascular access device: Secondary | ICD-10-CM | POA: Diagnosis not present

## 2021-11-11 DIAGNOSIS — Z853 Personal history of malignant neoplasm of breast: Secondary | ICD-10-CM | POA: Insufficient documentation

## 2021-11-11 MED ORDER — HEPARIN SOD (PORK) LOCK FLUSH 100 UNIT/ML IV SOLN
500.0000 [IU] | Freq: Once | INTRAVENOUS | Status: AC
  Administered 2021-11-11: 500 [IU] via INTRAVENOUS
  Filled 2021-11-11: qty 5

## 2021-11-11 MED ORDER — SODIUM CHLORIDE 0.9% FLUSH
10.0000 mL | Freq: Once | INTRAVENOUS | Status: AC
  Administered 2021-11-11: 10 mL via INTRAVENOUS
  Filled 2021-11-11: qty 10

## 2022-01-06 ENCOUNTER — Inpatient Hospital Stay: Payer: Medicare Other | Attending: Oncology

## 2022-01-06 DIAGNOSIS — Z853 Personal history of malignant neoplasm of breast: Secondary | ICD-10-CM | POA: Diagnosis present

## 2022-01-06 DIAGNOSIS — Z452 Encounter for adjustment and management of vascular access device: Secondary | ICD-10-CM | POA: Insufficient documentation

## 2022-01-06 DIAGNOSIS — Z95828 Presence of other vascular implants and grafts: Secondary | ICD-10-CM

## 2022-01-06 MED ORDER — HEPARIN SOD (PORK) LOCK FLUSH 100 UNIT/ML IV SOLN
500.0000 [IU] | Freq: Once | INTRAVENOUS | Status: AC
  Administered 2022-01-06: 500 [IU] via INTRAVENOUS
  Filled 2022-01-06: qty 5

## 2022-01-06 MED ORDER — SODIUM CHLORIDE 0.9% FLUSH
10.0000 mL | Freq: Once | INTRAVENOUS | Status: AC
  Administered 2022-01-06: 10 mL via INTRAVENOUS
  Filled 2022-01-06: qty 10

## 2022-02-07 DIAGNOSIS — R262 Difficulty in walking, not elsewhere classified: Secondary | ICD-10-CM | POA: Diagnosis not present

## 2022-02-07 DIAGNOSIS — M6281 Muscle weakness (generalized): Secondary | ICD-10-CM | POA: Diagnosis not present

## 2022-02-07 DIAGNOSIS — R2689 Other abnormalities of gait and mobility: Secondary | ICD-10-CM | POA: Diagnosis not present

## 2022-02-08 DIAGNOSIS — M6281 Muscle weakness (generalized): Secondary | ICD-10-CM | POA: Diagnosis not present

## 2022-02-08 DIAGNOSIS — R262 Difficulty in walking, not elsewhere classified: Secondary | ICD-10-CM | POA: Diagnosis not present

## 2022-02-08 DIAGNOSIS — R2689 Other abnormalities of gait and mobility: Secondary | ICD-10-CM | POA: Diagnosis not present

## 2022-02-09 DIAGNOSIS — M6281 Muscle weakness (generalized): Secondary | ICD-10-CM | POA: Diagnosis not present

## 2022-02-09 DIAGNOSIS — R262 Difficulty in walking, not elsewhere classified: Secondary | ICD-10-CM | POA: Diagnosis not present

## 2022-02-09 DIAGNOSIS — R2689 Other abnormalities of gait and mobility: Secondary | ICD-10-CM | POA: Diagnosis not present

## 2022-02-11 DIAGNOSIS — M6281 Muscle weakness (generalized): Secondary | ICD-10-CM | POA: Diagnosis not present

## 2022-02-11 DIAGNOSIS — R262 Difficulty in walking, not elsewhere classified: Secondary | ICD-10-CM | POA: Diagnosis not present

## 2022-02-11 DIAGNOSIS — R2689 Other abnormalities of gait and mobility: Secondary | ICD-10-CM | POA: Diagnosis not present

## 2022-02-13 DIAGNOSIS — R262 Difficulty in walking, not elsewhere classified: Secondary | ICD-10-CM | POA: Diagnosis not present

## 2022-02-13 DIAGNOSIS — M6281 Muscle weakness (generalized): Secondary | ICD-10-CM | POA: Diagnosis not present

## 2022-02-13 DIAGNOSIS — R2689 Other abnormalities of gait and mobility: Secondary | ICD-10-CM | POA: Diagnosis not present

## 2022-02-15 DIAGNOSIS — R2689 Other abnormalities of gait and mobility: Secondary | ICD-10-CM | POA: Diagnosis not present

## 2022-02-15 DIAGNOSIS — R262 Difficulty in walking, not elsewhere classified: Secondary | ICD-10-CM | POA: Diagnosis not present

## 2022-02-15 DIAGNOSIS — M6281 Muscle weakness (generalized): Secondary | ICD-10-CM | POA: Diagnosis not present

## 2022-02-16 DIAGNOSIS — R262 Difficulty in walking, not elsewhere classified: Secondary | ICD-10-CM | POA: Diagnosis not present

## 2022-02-16 DIAGNOSIS — M6281 Muscle weakness (generalized): Secondary | ICD-10-CM | POA: Diagnosis not present

## 2022-02-16 DIAGNOSIS — R2689 Other abnormalities of gait and mobility: Secondary | ICD-10-CM | POA: Diagnosis not present

## 2022-02-17 DIAGNOSIS — R262 Difficulty in walking, not elsewhere classified: Secondary | ICD-10-CM | POA: Diagnosis not present

## 2022-02-17 DIAGNOSIS — M6281 Muscle weakness (generalized): Secondary | ICD-10-CM | POA: Diagnosis not present

## 2022-02-17 DIAGNOSIS — R2689 Other abnormalities of gait and mobility: Secondary | ICD-10-CM | POA: Diagnosis not present

## 2022-02-18 DIAGNOSIS — R262 Difficulty in walking, not elsewhere classified: Secondary | ICD-10-CM | POA: Diagnosis not present

## 2022-02-18 DIAGNOSIS — R2689 Other abnormalities of gait and mobility: Secondary | ICD-10-CM | POA: Diagnosis not present

## 2022-02-18 DIAGNOSIS — M6281 Muscle weakness (generalized): Secondary | ICD-10-CM | POA: Diagnosis not present

## 2022-02-21 DIAGNOSIS — R2689 Other abnormalities of gait and mobility: Secondary | ICD-10-CM | POA: Diagnosis not present

## 2022-02-21 DIAGNOSIS — R262 Difficulty in walking, not elsewhere classified: Secondary | ICD-10-CM | POA: Diagnosis not present

## 2022-02-21 DIAGNOSIS — M6281 Muscle weakness (generalized): Secondary | ICD-10-CM | POA: Diagnosis not present

## 2022-02-22 DIAGNOSIS — R262 Difficulty in walking, not elsewhere classified: Secondary | ICD-10-CM | POA: Diagnosis not present

## 2022-02-22 DIAGNOSIS — R2689 Other abnormalities of gait and mobility: Secondary | ICD-10-CM | POA: Diagnosis not present

## 2022-02-22 DIAGNOSIS — M6281 Muscle weakness (generalized): Secondary | ICD-10-CM | POA: Diagnosis not present

## 2022-02-23 DIAGNOSIS — M6281 Muscle weakness (generalized): Secondary | ICD-10-CM | POA: Diagnosis not present

## 2022-02-23 DIAGNOSIS — R2689 Other abnormalities of gait and mobility: Secondary | ICD-10-CM | POA: Diagnosis not present

## 2022-02-23 DIAGNOSIS — R262 Difficulty in walking, not elsewhere classified: Secondary | ICD-10-CM | POA: Diagnosis not present

## 2022-02-24 DIAGNOSIS — R262 Difficulty in walking, not elsewhere classified: Secondary | ICD-10-CM | POA: Diagnosis not present

## 2022-02-24 DIAGNOSIS — R2689 Other abnormalities of gait and mobility: Secondary | ICD-10-CM | POA: Diagnosis not present

## 2022-02-24 DIAGNOSIS — M6281 Muscle weakness (generalized): Secondary | ICD-10-CM | POA: Diagnosis not present

## 2022-02-25 DIAGNOSIS — M6281 Muscle weakness (generalized): Secondary | ICD-10-CM | POA: Diagnosis not present

## 2022-02-25 DIAGNOSIS — R262 Difficulty in walking, not elsewhere classified: Secondary | ICD-10-CM | POA: Diagnosis not present

## 2022-02-25 DIAGNOSIS — R2689 Other abnormalities of gait and mobility: Secondary | ICD-10-CM | POA: Diagnosis not present

## 2022-02-28 DIAGNOSIS — R262 Difficulty in walking, not elsewhere classified: Secondary | ICD-10-CM | POA: Diagnosis not present

## 2022-02-28 DIAGNOSIS — M6281 Muscle weakness (generalized): Secondary | ICD-10-CM | POA: Diagnosis not present

## 2022-02-28 DIAGNOSIS — R2689 Other abnormalities of gait and mobility: Secondary | ICD-10-CM | POA: Diagnosis not present

## 2022-03-01 DIAGNOSIS — M6281 Muscle weakness (generalized): Secondary | ICD-10-CM | POA: Diagnosis not present

## 2022-03-01 DIAGNOSIS — R2689 Other abnormalities of gait and mobility: Secondary | ICD-10-CM | POA: Diagnosis not present

## 2022-03-01 DIAGNOSIS — R262 Difficulty in walking, not elsewhere classified: Secondary | ICD-10-CM | POA: Diagnosis not present

## 2022-03-02 DIAGNOSIS — R2689 Other abnormalities of gait and mobility: Secondary | ICD-10-CM | POA: Diagnosis not present

## 2022-03-02 DIAGNOSIS — R262 Difficulty in walking, not elsewhere classified: Secondary | ICD-10-CM | POA: Diagnosis not present

## 2022-03-02 DIAGNOSIS — M6281 Muscle weakness (generalized): Secondary | ICD-10-CM | POA: Diagnosis not present

## 2022-03-03 ENCOUNTER — Inpatient Hospital Stay: Payer: 59 | Attending: Oncology

## 2022-03-03 DIAGNOSIS — Z95828 Presence of other vascular implants and grafts: Secondary | ICD-10-CM

## 2022-03-03 DIAGNOSIS — R2689 Other abnormalities of gait and mobility: Secondary | ICD-10-CM | POA: Diagnosis not present

## 2022-03-03 DIAGNOSIS — Z452 Encounter for adjustment and management of vascular access device: Secondary | ICD-10-CM | POA: Diagnosis not present

## 2022-03-03 DIAGNOSIS — N319 Neuromuscular dysfunction of bladder, unspecified: Secondary | ICD-10-CM | POA: Diagnosis not present

## 2022-03-03 DIAGNOSIS — R262 Difficulty in walking, not elsewhere classified: Secondary | ICD-10-CM | POA: Diagnosis not present

## 2022-03-03 DIAGNOSIS — Z853 Personal history of malignant neoplasm of breast: Secondary | ICD-10-CM | POA: Diagnosis not present

## 2022-03-03 DIAGNOSIS — M6281 Muscle weakness (generalized): Secondary | ICD-10-CM | POA: Diagnosis not present

## 2022-03-03 DIAGNOSIS — G35 Multiple sclerosis: Secondary | ICD-10-CM | POA: Diagnosis not present

## 2022-03-03 MED ORDER — SODIUM CHLORIDE 0.9% FLUSH
10.0000 mL | Freq: Once | INTRAVENOUS | Status: AC
  Administered 2022-03-03: 10 mL via INTRAVENOUS
  Filled 2022-03-03: qty 10

## 2022-03-03 MED ORDER — HEPARIN SOD (PORK) LOCK FLUSH 100 UNIT/ML IV SOLN
500.0000 [IU] | Freq: Once | INTRAVENOUS | Status: AC
  Administered 2022-03-03: 500 [IU] via INTRAVENOUS
  Filled 2022-03-03: qty 5

## 2022-03-04 DIAGNOSIS — R262 Difficulty in walking, not elsewhere classified: Secondary | ICD-10-CM | POA: Diagnosis not present

## 2022-03-04 DIAGNOSIS — R2689 Other abnormalities of gait and mobility: Secondary | ICD-10-CM | POA: Diagnosis not present

## 2022-03-04 DIAGNOSIS — M6281 Muscle weakness (generalized): Secondary | ICD-10-CM | POA: Diagnosis not present

## 2022-03-06 DIAGNOSIS — M6281 Muscle weakness (generalized): Secondary | ICD-10-CM | POA: Diagnosis not present

## 2022-03-06 DIAGNOSIS — R2689 Other abnormalities of gait and mobility: Secondary | ICD-10-CM | POA: Diagnosis not present

## 2022-03-06 DIAGNOSIS — R262 Difficulty in walking, not elsewhere classified: Secondary | ICD-10-CM | POA: Diagnosis not present

## 2022-03-08 DIAGNOSIS — R262 Difficulty in walking, not elsewhere classified: Secondary | ICD-10-CM | POA: Diagnosis not present

## 2022-03-08 DIAGNOSIS — M6281 Muscle weakness (generalized): Secondary | ICD-10-CM | POA: Diagnosis not present

## 2022-03-08 DIAGNOSIS — R2689 Other abnormalities of gait and mobility: Secondary | ICD-10-CM | POA: Diagnosis not present

## 2022-03-22 DIAGNOSIS — N390 Urinary tract infection, site not specified: Secondary | ICD-10-CM | POA: Diagnosis not present

## 2022-03-22 DIAGNOSIS — R8290 Unspecified abnormal findings in urine: Secondary | ICD-10-CM | POA: Diagnosis not present

## 2022-03-22 DIAGNOSIS — G8929 Other chronic pain: Secondary | ICD-10-CM | POA: Diagnosis not present

## 2022-03-22 DIAGNOSIS — N319 Neuromuscular dysfunction of bladder, unspecified: Secondary | ICD-10-CM | POA: Diagnosis not present

## 2022-03-24 DIAGNOSIS — R8290 Unspecified abnormal findings in urine: Secondary | ICD-10-CM | POA: Diagnosis not present

## 2022-03-24 DIAGNOSIS — G35 Multiple sclerosis: Secondary | ICD-10-CM | POA: Diagnosis not present

## 2022-03-24 DIAGNOSIS — N390 Urinary tract infection, site not specified: Secondary | ICD-10-CM | POA: Diagnosis not present

## 2022-03-24 DIAGNOSIS — R262 Difficulty in walking, not elsewhere classified: Secondary | ICD-10-CM | POA: Diagnosis not present

## 2022-03-24 DIAGNOSIS — N319 Neuromuscular dysfunction of bladder, unspecified: Secondary | ICD-10-CM | POA: Diagnosis not present

## 2022-03-29 DIAGNOSIS — N390 Urinary tract infection, site not specified: Secondary | ICD-10-CM | POA: Diagnosis not present

## 2022-03-29 DIAGNOSIS — B372 Candidiasis of skin and nail: Secondary | ICD-10-CM | POA: Diagnosis not present

## 2022-03-31 DIAGNOSIS — B964 Proteus (mirabilis) (morganii) as the cause of diseases classified elsewhere: Secondary | ICD-10-CM | POA: Diagnosis not present

## 2022-03-31 DIAGNOSIS — G35 Multiple sclerosis: Secondary | ICD-10-CM | POA: Diagnosis not present

## 2022-03-31 DIAGNOSIS — N390 Urinary tract infection, site not specified: Secondary | ICD-10-CM | POA: Diagnosis not present

## 2022-03-31 DIAGNOSIS — G8929 Other chronic pain: Secondary | ICD-10-CM | POA: Diagnosis not present

## 2022-04-28 ENCOUNTER — Inpatient Hospital Stay: Payer: 59 | Attending: Oncology

## 2022-04-28 DIAGNOSIS — Z452 Encounter for adjustment and management of vascular access device: Secondary | ICD-10-CM | POA: Diagnosis not present

## 2022-04-28 DIAGNOSIS — Z853 Personal history of malignant neoplasm of breast: Secondary | ICD-10-CM | POA: Insufficient documentation

## 2022-04-28 DIAGNOSIS — Z95828 Presence of other vascular implants and grafts: Secondary | ICD-10-CM

## 2022-04-28 MED ORDER — HEPARIN SOD (PORK) LOCK FLUSH 100 UNIT/ML IV SOLN
500.0000 [IU] | Freq: Once | INTRAVENOUS | Status: AC
  Administered 2022-04-28: 500 [IU] via INTRAVENOUS
  Filled 2022-04-28: qty 5

## 2022-04-28 MED ORDER — SODIUM CHLORIDE 0.9% FLUSH
10.0000 mL | Freq: Once | INTRAVENOUS | Status: AC
  Administered 2022-04-28: 10 mL via INTRAVENOUS
  Filled 2022-04-28: qty 10

## 2022-05-03 DIAGNOSIS — E785 Hyperlipidemia, unspecified: Secondary | ICD-10-CM | POA: Diagnosis not present

## 2022-05-03 DIAGNOSIS — G20A1 Parkinson's disease without dyskinesia, without mention of fluctuations: Secondary | ICD-10-CM | POA: Diagnosis not present

## 2022-05-11 DIAGNOSIS — R5381 Other malaise: Secondary | ICD-10-CM | POA: Diagnosis not present

## 2022-05-12 DIAGNOSIS — I10 Essential (primary) hypertension: Secondary | ICD-10-CM | POA: Diagnosis not present

## 2022-05-12 DIAGNOSIS — R059 Cough, unspecified: Secondary | ICD-10-CM | POA: Diagnosis not present

## 2022-05-12 DIAGNOSIS — N390 Urinary tract infection, site not specified: Secondary | ICD-10-CM | POA: Diagnosis not present

## 2022-05-12 DIAGNOSIS — U071 COVID-19: Secondary | ICD-10-CM | POA: Diagnosis not present

## 2022-05-13 DIAGNOSIS — N390 Urinary tract infection, site not specified: Secondary | ICD-10-CM | POA: Diagnosis not present

## 2022-05-13 DIAGNOSIS — U071 COVID-19: Secondary | ICD-10-CM | POA: Diagnosis not present

## 2022-05-13 DIAGNOSIS — Z789 Other specified health status: Secondary | ICD-10-CM | POA: Diagnosis not present

## 2022-05-18 DIAGNOSIS — U071 COVID-19: Secondary | ICD-10-CM | POA: Diagnosis not present

## 2022-05-18 DIAGNOSIS — Z789 Other specified health status: Secondary | ICD-10-CM | POA: Diagnosis not present

## 2022-05-18 DIAGNOSIS — N390 Urinary tract infection, site not specified: Secondary | ICD-10-CM | POA: Diagnosis not present

## 2022-05-24 DIAGNOSIS — U071 COVID-19: Secondary | ICD-10-CM | POA: Diagnosis not present

## 2022-05-24 DIAGNOSIS — N390 Urinary tract infection, site not specified: Secondary | ICD-10-CM | POA: Diagnosis not present

## 2022-05-24 DIAGNOSIS — G35 Multiple sclerosis: Secondary | ICD-10-CM | POA: Diagnosis not present

## 2022-05-24 DIAGNOSIS — Z7189 Other specified counseling: Secondary | ICD-10-CM | POA: Diagnosis not present

## 2022-05-24 DIAGNOSIS — K219 Gastro-esophageal reflux disease without esophagitis: Secondary | ICD-10-CM | POA: Diagnosis not present

## 2022-05-26 DIAGNOSIS — R059 Cough, unspecified: Secondary | ICD-10-CM | POA: Diagnosis not present

## 2022-05-26 DIAGNOSIS — N390 Urinary tract infection, site not specified: Secondary | ICD-10-CM | POA: Diagnosis not present

## 2022-05-26 DIAGNOSIS — E559 Vitamin D deficiency, unspecified: Secondary | ICD-10-CM | POA: Diagnosis not present

## 2022-05-26 DIAGNOSIS — G35 Multiple sclerosis: Secondary | ICD-10-CM | POA: Diagnosis not present

## 2022-05-26 DIAGNOSIS — N319 Neuromuscular dysfunction of bladder, unspecified: Secondary | ICD-10-CM | POA: Diagnosis not present

## 2022-05-26 DIAGNOSIS — K0381 Cracked tooth: Secondary | ICD-10-CM | POA: Diagnosis not present

## 2022-05-26 DIAGNOSIS — R82998 Other abnormal findings in urine: Secondary | ICD-10-CM | POA: Diagnosis not present

## 2022-05-27 DIAGNOSIS — U071 COVID-19: Secondary | ICD-10-CM | POA: Diagnosis not present

## 2022-05-30 DIAGNOSIS — U071 COVID-19: Secondary | ICD-10-CM | POA: Diagnosis not present

## 2022-05-31 DIAGNOSIS — U071 COVID-19: Secondary | ICD-10-CM | POA: Diagnosis not present

## 2022-06-01 DIAGNOSIS — U071 COVID-19: Secondary | ICD-10-CM | POA: Diagnosis not present

## 2022-06-02 DIAGNOSIS — U071 COVID-19: Secondary | ICD-10-CM | POA: Diagnosis not present

## 2022-06-02 DIAGNOSIS — E785 Hyperlipidemia, unspecified: Secondary | ICD-10-CM | POA: Diagnosis not present

## 2022-06-07 DIAGNOSIS — K8689 Other specified diseases of pancreas: Secondary | ICD-10-CM | POA: Diagnosis not present

## 2022-06-07 DIAGNOSIS — G35 Multiple sclerosis: Secondary | ICD-10-CM | POA: Diagnosis not present

## 2022-06-07 DIAGNOSIS — U071 COVID-19: Secondary | ICD-10-CM | POA: Diagnosis not present

## 2022-06-07 DIAGNOSIS — Z978 Presence of other specified devices: Secondary | ICD-10-CM | POA: Diagnosis not present

## 2022-06-07 DIAGNOSIS — N319 Neuromuscular dysfunction of bladder, unspecified: Secondary | ICD-10-CM | POA: Diagnosis not present

## 2022-06-07 DIAGNOSIS — G8929 Other chronic pain: Secondary | ICD-10-CM | POA: Diagnosis not present

## 2022-06-07 DIAGNOSIS — K219 Gastro-esophageal reflux disease without esophagitis: Secondary | ICD-10-CM | POA: Diagnosis not present

## 2022-06-07 DIAGNOSIS — G20C Parkinsonism, unspecified: Secondary | ICD-10-CM | POA: Diagnosis not present

## 2022-06-09 DIAGNOSIS — U071 COVID-19: Secondary | ICD-10-CM | POA: Diagnosis not present

## 2022-06-10 DIAGNOSIS — U071 COVID-19: Secondary | ICD-10-CM | POA: Diagnosis not present

## 2022-06-14 DIAGNOSIS — R262 Difficulty in walking, not elsewhere classified: Secondary | ICD-10-CM | POA: Diagnosis not present

## 2022-06-14 DIAGNOSIS — L03116 Cellulitis of left lower limb: Secondary | ICD-10-CM | POA: Diagnosis not present

## 2022-06-14 DIAGNOSIS — M6281 Muscle weakness (generalized): Secondary | ICD-10-CM | POA: Diagnosis not present

## 2022-06-20 ENCOUNTER — Telehealth: Payer: Self-pay | Admitting: Family Medicine

## 2022-06-20 ENCOUNTER — Ambulatory Visit: Payer: 59 | Admitting: Family Medicine

## 2022-06-20 NOTE — Telephone Encounter (Signed)
Pt needs an appointment

## 2022-06-20 NOTE — Telephone Encounter (Signed)
She has an appt this afternoon 

## 2022-06-20 NOTE — Telephone Encounter (Signed)
Copied from CRM [id number]. Topic: General - Other >> Jun 20, 2022  1:46 PM Ja-Kwan M wrote: Reason for CRM: Pt husband Roe Coombs stated pt needs a lift chair and they would like for Danelle Berry to call them to discuss. Cb# [phone]

## 2022-06-21 ENCOUNTER — Telehealth: Payer: Self-pay

## 2022-06-21 NOTE — Telephone Encounter (Signed)
Roe Coombs, patient's husband, called and l/m on triage line stating patient has been in rehab for over a year and just came home and they need prescription for Renacidin irrigation-per husband the original pharmacy they were using do not have this and wanted to see where else they can get this from. Patient's last OV 04/2021-patient does need a follow up appointment with Carollee Herter. Not sure what pharmacy will have this irrigation. Patient can also try below  I called Mr Brammer but it kept ringing with no answer.  Vinegar Bladder Irrigation Protocol patient education  Patient's on intermittent catheterization with chronic bacteriuria and/or chronic bladder stones, irrigating the bladder with a dilute vinegar solution can be beneficial.  The recommended concentration is 0.25% acetic acid. Most grocery stores carry white vinegar as a 5% solution.  Therefore, to make appropriate bladder irrigations, it needs to be diluted at a ratio of roughly 20:1.  To achieve this, see the chart below to determine what amount of 5% white vinegar solution you should mix with your normal bladder irrigation (homemade saline or sterile sodium chloride from the pharmacy).  Amount of 5% White Vinegar Solution to Mix In: If Your Normal Bladder Irrigation Amount Is: 2.5 teaspoons (12.5 mL) 250 mL irrigation 5 teaspoons (25 mL) 500 mL irrigation 10 teaspoons (50 mL) 1000 mL irrigation About 6 ounces 1 gallon irrigation  It might be helpful to remember:  One teaspoon = 5 mL  One tablespoon = 15 mL  Irrigate the bladder with this solution using the volumes and techniques you've discussed with your health care provider. In certain circumstances, your provider might instruct you to leave some irrigation in the bladder for a period of time to dissolve debris/mucous. Discontinue irrigation if it causes pain or discomfort. Do not use this solution if you believe your child has an acute urinary tract infection.

## 2022-06-23 ENCOUNTER — Inpatient Hospital Stay: Payer: 59

## 2022-06-23 ENCOUNTER — Inpatient Hospital Stay: Payer: 59 | Attending: Oncology

## 2022-06-23 DIAGNOSIS — Z452 Encounter for adjustment and management of vascular access device: Secondary | ICD-10-CM | POA: Diagnosis not present

## 2022-06-23 DIAGNOSIS — Z95828 Presence of other vascular implants and grafts: Secondary | ICD-10-CM

## 2022-06-23 DIAGNOSIS — Z853 Personal history of malignant neoplasm of breast: Secondary | ICD-10-CM | POA: Diagnosis not present

## 2022-06-23 MED ORDER — HEPARIN SOD (PORK) LOCK FLUSH 100 UNIT/ML IV SOLN
500.0000 [IU] | Freq: Once | INTRAVENOUS | Status: AC
  Administered 2022-06-23: 500 [IU] via INTRAVENOUS
  Filled 2022-06-23: qty 5

## 2022-06-23 MED ORDER — SODIUM CHLORIDE 0.9% FLUSH
10.0000 mL | Freq: Once | INTRAVENOUS | Status: AC
  Administered 2022-06-23: 10 mL via INTRAVENOUS
  Filled 2022-06-23: qty 10

## 2022-06-23 NOTE — Telephone Encounter (Signed)
LMOM for patient to return call.

## 2022-06-24 ENCOUNTER — Ambulatory Visit: Payer: 59 | Admitting: Family Medicine

## 2022-06-30 ENCOUNTER — Ambulatory Visit: Payer: 59 | Admitting: Family Medicine

## 2022-07-03 ENCOUNTER — Emergency Department
Admission: EM | Admit: 2022-07-03 | Discharge: 2022-07-03 | Disposition: A | Discharge status: Home / Self Care | Payer: 59 | Attending: Student in an Organized Health Care Education/Training Program | Admitting: Student in an Organized Health Care Education/Training Program

## 2022-07-03 ENCOUNTER — Other Ambulatory Visit: Payer: Self-pay

## 2022-07-03 DIAGNOSIS — T83091A Other mechanical complication of indwelling urethral catheter, initial encounter: Secondary | ICD-10-CM | POA: Diagnosis not present

## 2022-07-03 DIAGNOSIS — Y732 Prosthetic and other implants, materials and accessory gastroenterology and urology devices associated with adverse incidents: Secondary | ICD-10-CM | POA: Diagnosis not present

## 2022-07-03 DIAGNOSIS — N3000 Acute cystitis without hematuria: Secondary | ICD-10-CM | POA: Insufficient documentation

## 2022-07-03 DIAGNOSIS — Z743 Need for continuous supervision: Secondary | ICD-10-CM | POA: Diagnosis not present

## 2022-07-03 DIAGNOSIS — T83021A Displacement of indwelling urethral catheter, initial encounter: Secondary | ICD-10-CM | POA: Insufficient documentation

## 2022-07-03 DIAGNOSIS — R6889 Other general symptoms and signs: Secondary | ICD-10-CM | POA: Diagnosis not present

## 2022-07-03 DIAGNOSIS — I499 Cardiac arrhythmia, unspecified: Secondary | ICD-10-CM | POA: Diagnosis not present

## 2022-07-03 DIAGNOSIS — R32 Unspecified urinary incontinence: Secondary | ICD-10-CM | POA: Diagnosis present

## 2022-07-03 DIAGNOSIS — T839XXA Unspecified complication of genitourinary prosthetic device, implant and graft, initial encounter: Secondary | ICD-10-CM

## 2022-07-03 DIAGNOSIS — R0902 Hypoxemia: Secondary | ICD-10-CM | POA: Diagnosis not present

## 2022-07-03 DIAGNOSIS — Z7401 Bed confinement status: Secondary | ICD-10-CM | POA: Diagnosis not present

## 2022-07-03 LAB — URINALYSIS, ROUTINE W REFLEX MICROSCOPIC
Bilirubin Urine: NEGATIVE
Glucose, UA: NEGATIVE mg/dL
Ketones, ur: NEGATIVE mg/dL
Nitrite: NEGATIVE
Protein, ur: >=300 mg/dL — AB
RBC / HPF: >50 RBC/hpf (ref 0–5)
Specific Gravity, Urine: 1.019 (ref 1.005–1.030)
WBC, UA: >50 WBC/hpf (ref 0–5)
pH: 7 (ref 5.0–8.0)

## 2022-07-03 MED ORDER — FLUCONAZOLE 150 MG PO TABS: 150.0000 mg | ORAL_TABLET | Freq: Once | ORAL | 0 refills | Status: AC

## 2022-07-03 MED ORDER — CEPHALEXIN 500 MG PO CAPS
500.0000 mg | ORAL_CAPSULE | Freq: Once | ORAL | Status: AC
Start: 2022-07-03 — End: 2022-07-03
  Administered 2022-07-03: 500 mg via ORAL
  Filled 2022-07-03: qty 1

## 2022-07-03 MED ORDER — CEPHALEXIN 500 MG PO CAPS: 500.0000 mg | ORAL_CAPSULE | Freq: Three times a day (TID) | ORAL | 0 refills | Status: AC

## 2022-07-03 NOTE — ED Notes (Signed)
Patient was incontinent to stool. Dried stool noted on upper buttocks. Buttocks and upper thighs are reddened. Scattered open wound s noted on left upper thigh at buttocks. Patient was cleaned, linens changed. Skin protectant applied. Dr. Roxan Hockey aware.

## 2022-07-03 NOTE — ED Triage Notes (Signed)
Per EMS report, patient has a displaced Foley catheter that was placed on 05/16/2022 at [facility] in [place], bag changed by her husband on 5/14. Patient has been home from the facility for over a week. Patient has been incontinent of stool. Patient has MS and is bed bound at this time. Catheter is soiled and urine is leaking around the catheter. Scant amount of dark urine noted in bag. Patient states her husband emptied the catheter bag approximately 2 hours PTA.

## 2022-07-03 NOTE — ED Provider Notes (Signed)
[facility] Provider Note    Event Date/Time   First MD Initiated Contact with Patient 07/03/22 1346     (approximate)   History   urinary catheter displacement   HPI  Rebecca [name] is a 62 y.o. female with a chronic indwelling Foley with history of MS presents to the ER for evaluation of urinary incontinence despite Foley catheter.  This Foley catheter has been in since the beginning of April.  She denies any fevers or chills.  No other complaints.     Physical Exam   Triage Vital Signs: ED Triage Vitals  Enc Vitals Group     BP 07/03/22 1355 (!) 162/101     Pulse Rate 07/03/22 1355 (!) 108     Resp 07/03/22 1355 18     Temp 07/03/22 1355 97.8 F (36.6 C)     Temp Source 07/03/22 1355 Oral     SpO2 07/03/22 1355 95 %     Weight 07/03/22 1401 223 lb (101.2 kg)     Height 07/03/22 1401 5\' 4"  (1.626 m)     Head Circumference --      Peak Flow --      Pain Score 07/03/22 1355 0     Pain Loc --      Pain Edu? --      Excl. in GC? --     Most recent vital signs: Vitals:   07/03/22 1355  BP: (!) 162/101  Pulse: (!) 108  Resp: 18  Temp: 97.8 F (36.6 C)  SpO2: 95%     Constitutional: Alert  Eyes: Conjunctivae are normal.  Head: Atraumatic. Nose: No congestion/rhinnorhea. Mouth/Throat: Mucous membranes are moist.   Neck: Painless ROM.  Cardiovascular:   Good peripheral circulation. Respiratory: Normal respiratory effort.  No retractions.  Gastrointestinal: Soft and nontender.  Musculoskeletal:  no deformity      ED Results / Procedures / Treatments   Labs (all labs ordered are listed, but only abnormal results are displayed) Labs Reviewed  URINALYSIS, ROUTINE W REFLEX MICROSCOPIC - Abnormal; Notable for the following components:      Result Value   Color, Urine YELLOW (*)    APPearance TURBID (*)    Hgb urine dipstick SMALL (*)    Protein, ur >=300 (*)    Leukocytes,Ua MODERATE (*)    Bacteria, UA MANY (*)    All  other components within normal limits     EKG     RADIOLOGY    PROCEDURES:  Critical Care performed:   Procedures   MEDICATIONS ORDERED IN ED: Medications  cephALEXin (KEFLEX) capsule 500 mg (has no administration in time range)     IMPRESSION / MDM / ASSESSMENT AND PLAN / ED COURSE  I reviewed the triage vital signs and the nursing notes.                              Differential diagnosis includes, but is not limited to, displaced Foley catheter, UTI, hematuria  Patient presented to the ER for displacement of urinary catheter.  Catheter actually in place but is clogged with decreased drainage and now with urinary incontinence.  Foley catheter was exchanged.  Does have purulent urine she is not septic well-appearing she does not have any other complaints.  Urinalysis is consistent with UTI does appear more infected than previous urinalysis.  Previous culture data reveals that she has grown out Klebsiella sensitive to Keflex  for which she will be given a prescription for.  Does appear stable and appropriate for outpatient follow-up.  She has close outpatient follow-up with her urologist.      FINAL CLINICAL IMPRESSION(S) / ED DIAGNOSES   Final diagnoses:  Foley catheter problem, initial encounter (HCC)  Acute cystitis without hematuria     Rx / DC Orders   ED Discharge Orders          Ordered    cephALEXin (KEFLEX) 500 MG capsule  3 times daily        07/03/22 1600             Note:  This document was prepared using Dragon voice recognition software and may include unintentional dictation errors.    Willy Eddy, MD 07/03/22 [phone]

## 2022-07-07 ENCOUNTER — Ambulatory Visit (INDEPENDENT_AMBULATORY_CARE_PROVIDER_SITE_OTHER): Payer: 59 | Admitting: Family Medicine

## 2022-07-07 ENCOUNTER — Encounter: Payer: Self-pay | Admitting: Family Medicine

## 2022-07-07 VITALS — BP 138/84 | HR 95 | Temp 97.8°F | Resp 18 | Ht 64.0 in | Wt 223.0 lb

## 2022-07-07 DIAGNOSIS — D709 Neutropenia, unspecified: Secondary | ICD-10-CM

## 2022-07-07 DIAGNOSIS — Z7409 Other reduced mobility: Secondary | ICD-10-CM

## 2022-07-07 DIAGNOSIS — C50911 Malignant neoplasm of unspecified site of right female breast: Secondary | ICD-10-CM | POA: Diagnosis not present

## 2022-07-07 DIAGNOSIS — G801 Spastic diplegic cerebral palsy: Secondary | ICD-10-CM

## 2022-07-07 DIAGNOSIS — G894 Chronic pain syndrome: Secondary | ICD-10-CM | POA: Diagnosis not present

## 2022-07-07 DIAGNOSIS — Z9011 Acquired absence of right breast and nipple: Secondary | ICD-10-CM

## 2022-07-07 DIAGNOSIS — D696 Thrombocytopenia, unspecified: Secondary | ICD-10-CM | POA: Diagnosis not present

## 2022-07-07 DIAGNOSIS — K589 Irritable bowel syndrome without diarrhea: Secondary | ICD-10-CM

## 2022-07-07 DIAGNOSIS — Z978 Presence of other specified devices: Secondary | ICD-10-CM

## 2022-07-07 DIAGNOSIS — R269 Unspecified abnormalities of gait and mobility: Secondary | ICD-10-CM

## 2022-07-07 DIAGNOSIS — D649 Anemia, unspecified: Secondary | ICD-10-CM

## 2022-07-07 DIAGNOSIS — G40909 Epilepsy, unspecified, not intractable, without status epilepticus: Secondary | ICD-10-CM | POA: Diagnosis not present

## 2022-07-07 DIAGNOSIS — K592 Neurogenic bowel, not elsewhere classified: Secondary | ICD-10-CM

## 2022-07-07 DIAGNOSIS — R262 Difficulty in walking, not elsewhere classified: Secondary | ICD-10-CM

## 2022-07-07 DIAGNOSIS — Z1211 Encounter for screening for malignant neoplasm of colon: Secondary | ICD-10-CM

## 2022-07-07 DIAGNOSIS — K219 Gastro-esophageal reflux disease without esophagitis: Secondary | ICD-10-CM

## 2022-07-07 DIAGNOSIS — G8220 Paraplegia, unspecified: Secondary | ICD-10-CM | POA: Diagnosis not present

## 2022-07-07 DIAGNOSIS — Z09 Encounter for follow-up examination after completed treatment for conditions other than malignant neoplasm: Secondary | ICD-10-CM | POA: Diagnosis not present

## 2022-07-07 DIAGNOSIS — G35 Multiple sclerosis: Secondary | ICD-10-CM

## 2022-07-07 DIAGNOSIS — R7989 Other specified abnormal findings of blood chemistry: Secondary | ICD-10-CM | POA: Diagnosis not present

## 2022-07-07 DIAGNOSIS — R030 Elevated blood-pressure reading, without diagnosis of hypertension: Secondary | ICD-10-CM

## 2022-07-07 DIAGNOSIS — N319 Neuromuscular dysfunction of bladder, unspecified: Secondary | ICD-10-CM

## 2022-07-07 DIAGNOSIS — Z760 Encounter for issue of repeat prescription: Secondary | ICD-10-CM

## 2022-07-07 DIAGNOSIS — Z853 Personal history of malignant neoplasm of breast: Secondary | ICD-10-CM

## 2022-07-07 DIAGNOSIS — Z789 Other specified health status: Secondary | ICD-10-CM

## 2022-07-07 MED ORDER — LEVETIRACETAM 500 MG PO TABS: 500.0000 mg | ORAL_TABLET | Freq: Two times a day (BID) | ORAL | 0 refills | Status: DC

## 2022-07-07 MED ORDER — AMANTADINE HCL 100 MG PO CAPS: ORAL_CAPSULE | ORAL | 0 refills | Status: DC

## 2022-07-07 MED ORDER — OMEPRAZOLE 40 MG PO CPDR: 40.0000 mg | DELAYED_RELEASE_CAPSULE | Freq: Every day | ORAL | 1 refills | Status: DC

## 2022-07-07 MED ORDER — BACLOFEN 10 MG PO TABS
10.0000 mg | ORAL_TABLET | Freq: Three times a day (TID) | ORAL | 1 refills | Status: DC | PRN
Start: 2022-07-07 — End: 2022-08-15

## 2022-07-07 MED ORDER — OXYBUTYNIN CHLORIDE ER 10 MG PO TB24
10.0000 mg | ORAL_TABLET | Freq: Two times a day (BID) | ORAL | 0 refills | Status: DC
Start: 2022-07-07 — End: 2022-08-08

## 2022-07-07 NOTE — Progress Notes (Signed)
Name: Rebecca [name]   MRN: [id number]    DOB: 12-04-1960   Date:07/07/2022       Progress Note  Chief Complaint  Patient presents with   Follow-up   Medication Refill    Also needs RX for lift chair     Subjective:   Rebecca [name] is a 62 y.o. female, presents to clinic for routine f/up  I last saw pt in person about 3 years ago 07/2019 She has since done one MWV and one acute OV with float provider who is no longer in pcp office.   Hx of breast cancer, thrombocytopenia, anemia - she did follow with hem/onc  Indwelling foley, neurogenic bladder - managed by [facility] [facility] Rebecca [name] - need HH orders  IBS/diarrhea hx of incontinence and pancreatic insuficiency, GERD/gastritic, hx of schatzki's rings/dysphagia - she has seen [facility] in the past was previously on creon and PPI, no recent GI visits for about 2 years that I can see  MS/seizure disorder managed by neurology - cannot see any recent neurology OV  Some encounters over the past year w/o OV notes and visible records of nursing home/family medicine with providers but unclear if she moved her primary care to another office over the past couple years or not?  Needs HH orders PT OT RN - catheter care PCA trying to get restarted with that service at home  Fall/fracture at home Oct 2022-left tib-fib fracture with surgery prolonged hospitalization and skilled nursing and then-  SNF/rehab turnedinto long term nursing home - she has been in an outpt facility since Oct 2022 and only discharged 3 weeks ago- needs all home services, all meds refilled and various DME, but there are no records of recent care  Previously at home pt had intermittent leg weakness, swelling, and difficulty with gait/mobility and she was often working with PT to regain strength, previously she had at home a walker rolling, WC when she got too weak, transport chair,  Currently has a WC at home which she would like to self propel, but she is  non-ambulatory and is too weak to even transfer, on a gurney today for appt via EMS - here alone She wants a lift chair she says its already been approved and it was supposed to come home with them but didn't  PT in nursing home stopped earlier this year, no ability to transfer independenty, WC/bedbound bound right now, limited transportation with services because she cannot    Lab Results  Component Value Date   HGBA1C 5.1 09/27/2019    Seizure disorder out of her medications for over a week, discharged from nursing home 3 weeks ago - no records at all here or in system  Neuro f/up appt July 8th - virtual Her regular meds from neuro previously were changed while she was in the facility and she's been out of her meds for the last week or so no seizures She has needed high doses of baclofen 10 to 20 mg up to 3 times a day She was on amantadine 100 mg 3 times a day, Keppra 500 mg twice daily  GERD managed with omeprazole  She also requests oxybutynin -neurogenic bladder due to MS, chronic indwelling Foley managed by [facility] urology previously -she was chronically inoculated but intermittently would have pain or signs of infection and prior home health was sending urine for cultures or getting antibiotics unnecessarily which Rebecca. Apolinar [name] was previously trying to address and limit abx She does need nursing  home health to help with managing Foley  GI history acid reflux, IBS, incontinence of bowel, previously on pancreatic enzyme supplements which she states she has plenty of she also is due for colon cancer screening   Last labs visible in the hospital prior to being discharged to skilled nursing she had postoperative anemia and mildly elevated LFTs she does not know if blood work has been done for her in the past year plus, she would like some basic labs checked    Current Outpatient Medications:    amantadine (SYMMETREL) 100 MG capsule, TAKE 1 CAPSULE BY MOUTH THREE TIMES A DAY, Disp:  270 capsule, Rfl: 4   baclofen (LIORESAL) 10 MG tablet, TAKE 1 TO 2 TABLETS BY MOUTH 3 TIMES A DAY (Patient taking differently: 10-20 mg 3 (three) times daily.), Disp: 540 tablet, Rfl: 0   cephALEXin (KEFLEX) 500 MG capsule, Take 1 capsule (500 mg total) by mouth 3 (three) times daily for 7 days., Disp: 21 capsule, Rfl: 0   Disposable Gloves (ASSURANCE VINYL EXAM GLOVES) MISC, Neurogenic bladder; MS; LON 99 months; for use for personal hygiene, Disp: 50 each, Rfl: 11   fluconazole (DIFLUCAN) 150 MG tablet, Take 150 mg by mouth once., Disp: , Rfl:    gluconic acid-citric acid (RENACIDIN) irrigation, Insert 30 mL into suprapubic tube and clamp tube for 10 minutes and then drain twice daily, Disp: 500 mL, Rfl: 1   Glucosamine-Chondroitin (COSAMIN DS PO), Take 1 tablet by mouth 5 (five) times daily., Disp: , Rfl:    ibuprofen (ADVIL) 600 MG tablet, TAKE 1 TABLET BY MOUTH EVERY 8 HOURS AS NEEDED. (Patient taking differently: Take 600 mg by mouth every 8 (eight) hours as needed for moderate pain, mild pain or headache.), Disp: 90 tablet, Rfl: 3   levETIRAcetam (KEPPRA) 500 MG tablet, Take 1 tablet (500 mg total) by mouth 2 (two) times daily., Disp: 180 tablet, Rfl: 4   lidocaine-prilocaine (EMLA) cream, Apply 1 application topically as needed., Disp: 30 g, Rfl: 0   lipase/protease/amylase (CREON) 36000 UNITS CPEP capsule, TAKE 1 CAPSULE BY MOUTH WITH BREAKFAST, WITH LUNCH, AND WITH EVENING MEAL AND 1 CAPSULE WITH SNACKS. (Patient taking differently: Take 36,000 Units by mouth 3 (three) times daily with meals.), Disp: 540 capsule, Rfl: 2   loratadine (CLARITIN) 10 MG tablet, Take 10 mg by mouth daily., Disp: , Rfl:    Misc Natural Products (LEG VEIN & CIRCULATION) TABS, Take 1 tablet by mouth 2 (two) times daily. , Disp: , Rfl:    Multiple Vitamins-Minerals (HAIR SKIN AND NAILS FORMULA) TABS, Take 1 tablet by mouth 2 (two) times daily. , Disp: , Rfl:    Multiple Vitamins-Minerals (MULTIVITAMIN PO), Take 1  tablet by mouth daily. , Disp: , Rfl:    nystatin (MYCOSTATIN/NYSTOP) powder, Apply 1 application topically 3 (three) times daily as needed. For rash/raw skin as needed, Disp: 15 g, Rfl: 2   oxybutynin (DITROPAN-XL) 10 MG 24 hr tablet, TAKE 1 TABLET BY MOUTH TWICE A DAY (Patient taking differently: Take 10 mg by mouth in the morning and at bedtime.), Disp: 180 tablet, Rfl: 4   REBIF REBIDOSE 44 MCG/0.5ML SOAJ, Inject  44 mcg Sub-q 3 times weekly Rotate site after each injection, Disp: 12 mL, Rfl: 3   tiZANidine (ZANAFLEX) 4 MG tablet, TAKE 1 TABLET (4 MG TOTAL) BY MOUTH 2 (TWO) TIMES DAILY. (Patient taking differently: Take 4 mg by mouth in the morning and at bedtime.), Disp: 180 tablet, Rfl: 1   Turmeric 500  MG CAPS, Take 500 mg by mouth daily., Disp: , Rfl:    omeprazole (PRILOSEC) 40 MG capsule, TAKE 1 CAPSULE (40 MG TOTAL) BY MOUTH DAILY. (Patient taking differently: Take 40 mg by mouth daily. Marland Kitchen), Disp: 30 capsule, Rfl: 0  Patient Active Problem List   Diagnosis Date Noted   Bilateral impacted cerumen 06/29/2021   Conductive hearing loss, bilateral 06/29/2021   Tibia/fibula fracture, left, closed, initial encounter 12/08/2020   Dysphagia    Acute esophagitis    Gastric erythema    Mucosal abnormality of duodenum    Schatzki's ring    Diarrhea 04/14/2020   Elevated BP without diagnosis of hypertension 04/14/2020   Neuromuscular dysfunction of bladder, unspecified 03/27/2020   Urinary tract infection associated with indwelling urethral catheter (HCC) 02/17/2020   Acquired absence of unspecified breast and nipple 02/08/2020   History of falling 02/08/2020   Personal history of urinary (tract) infections 02/08/2020   Muscle spasm 02/28/2019   Post-menopausal bleeding 11/20/2018   Ambulatory dysfunction 02/02/2018   Abnormality of gait 11/17/2017   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   Bilateral lower extremity pain (primary) (bilateral) (right greater  than left) 08/01/2016   Chronic neck pain (secondary) (bilateral) ( left greater than right) 07/28/2016   Neutropenia (HCC) 06/27/2016   Chronic pain syndrome 06/03/2016   Neurogenic bladder    Neurogenic bowel    Detrusor and sphincter dyssynergia    Urinary incontinence    Seizure disorder (HCC)    Slow transit constipation    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HCC)    Lymphedema    History of breast cancer in female 10/28/2015   Anemia 10/28/2015   Thrombocytopenia (HCC) 10/28/2015   Allergic rhinitis 10/28/2015   IBS (irritable bowel syndrome) 10/28/2015   Uterine leiomyoma 10/24/2014   History of right mastectomy 10/24/2014   MS (multiple sclerosis) (HCC) 09/16/2014   Primary cancer of right female breast (HCC) 01/10/2014   SOB (shortness of breath) 03/01/2013   Bilateral lower extremity edema    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   CHOLECYSTECTOMY     COLONOSCOPY  2014   ESOPHAGOGASTRODUODENOSCOPY (EGD) WITH PROPOFOL N/A 07/16/2020   Procedure: ESOPHAGOGASTRODUODENOSCOPY (EGD) WITH PROPOFOL;  Surgeon: Pasty Spillers, MD;  Location: [facility] END[facility]COPY;  Service: Endoscopy;  Laterality: N/A;  Patient has MS and will need assistance   HYSTEROSCOPY WITH D & C N/A 11/27/2018   Procedure: DILATATION AND CURETTAGE /HYSTEROSCOPY;  Surgeon: Nadara Mustard, MD;  Location: [facility] ORS;  Service: Gynecology;  Laterality: N/A;   Lower extremity venous  Dopplers  02/27/2013   No LE DVT   MASTECTOMY Right 2015   ORIF TIBIA PLATEAU Left 12/09/2020   Procedure: LEFT OPEN REDUCTION INTERNAL FIXATION (ORIF) TIBIAL PLATEAU;  Surgeon: Roby Lofts, MD;  Location: [facility] OR;  Service: Orthopedics;  Laterality: Left;   Port a cath insertion Right 01/19/2010   PORT-A-CATH REMOVAL     right   PORT-A-CATH REMOVAL Right 09/03/2013  Procedure: REMOVAL PORT-A-CATH;  Surgeon: Fransisco Hertz, MD;  Location: [facility] OR;  Service: Vascular;  Laterality: Right;   SPINE SURGERY  Cervical steinois Rebecca. Venetia Maxon 2014 I think?   TONSILLECTOMY     TRANSTHORACIC ECHOCARDIOGRAM  03/2013; 02/2014   a) Normal LV size and function with EF 60-65%.; Cannot exclude bicuspid aortic valve with mild AS and mild AI.; b) Normal EF with normal wall motion and valve function x Mild MR. G2 DD. EF 60-65%. Tricuspid AoV   UPPER GI ENDOSCOPY  2014    Family History  Problem Relation Age of Onset   Cancer Father        skin   Heart disease Father    Heart attack Father        heart attack in his 79's   Thyroid disease Sister    Ovarian cancer Cousin    Breast cancer Maternal Aunt 92   Breast cancer Maternal Grandmother 52   Bladder Cancer Neg Hx    Kidney cancer Neg Hx     Social History   Tobacco Use   Smoking status: Never   Smokeless tobacco: Never   Tobacco comments:    Never smoked. My mom smoked. Passed 04/18/20. Dad smoked, quit 1973. Sister smokes.  Vaping Use   Vaping Use: Never used  Substance Use Topics   Alcohol use: No   Drug use: No     Allergies  Allergen Reactions   Fentanyl Nausea And Vomiting and Nausea Only    vomiting Was given in PACU x3 each time patient got sick    Sulfa Antibiotics Hives and Other (See Comments)    Light headed, over heated    Health Maintenance  Topic Date Due   Colonoscopy  02/07/2022   PAP SMEAR-Modifier  07/07/2022 (Originally 09/25/2021)   COVID-19 Vaccine (4 - 2023-24 season) 07/23/2022 (Originally 10/08/2021)   Zoster  Vaccines- Shingrix (1 of 2) 10/07/2022 (Originally 04/16/1979)   INFLUENZA VACCINE  09/08/2022   MAMMOGRAM  09/28/2022   Medicare Annual Wellness (AWV)  07/07/2023   Hepatitis C Screening  Completed   HIV Screening  Completed   HPV VACCINES  Aged Out   DTaP/Tdap/Td  Discontinued    Chart Review Today: I personally reviewed active problem list, medication list, allergies, family history, social history, health maintenance, notes from last encounter, lab results, imaging with the patient/caregiver today.   Review of Systems  Constitutional: Negative.   HENT: Negative.    Eyes: Negative.   Respiratory: Negative.    Cardiovascular: Negative.   Gastrointestinal: Negative.   Endocrine: Negative.   Genitourinary: Negative.   Musculoskeletal: Negative.   Skin: Negative.   Allergic/Immunologic: Negative.   Neurological: Negative.   Hematological: Negative.   Psychiatric/Behavioral: Negative.    All other systems reviewed and are negative.    Objective:   Vitals:   07/07/22 1051  BP: 138/84  Pulse: 95  Resp: 18  Temp: 97.8 F (36.6 C)  TempSrc: Oral  SpO2: 98%  Weight: 223 lb (101.2 kg)  Height: 5\' 4"  (1.626 m)    Body mass index is 38.28 kg/m.  Physical Exam Vitals and nursing note reviewed.  Constitutional:      General: She is not in acute distress.    Appearance: Normal appearance. She is obese. She is not ill-appearing, toxic-appearing or diaphoretic.     Comments: Well-appearing female, examined in a EMS gurney in exam room, NAD  HENT:     Head: Normocephalic and atraumatic.  Right Ear: External ear normal.     Left Ear: External ear normal.  Eyes:     General: No scleral icterus.       Right eye: No discharge.        Left eye: No discharge.     Conjunctiva/sclera: Conjunctivae normal.  Cardiovascular:     Rate and Rhythm: Normal rate and regular rhythm.     Pulses: Normal pulses.     Heart sounds: Normal heart sounds.  Pulmonary:     Effort:  Pulmonary effort is normal.     Breath sounds: Normal breath sounds.  Musculoskeletal:     Comments: Bilateral lower extremity edema Left lower leg with a brace  Neurological:     General: No focal deficit present.     Mental Status: She is alert.     Cranial Nerves: No facial asymmetry.     Comments: In EMS gurney Normal coordination of bilateral upper extremities  Psychiatric:        Attention and Perception: Attention normal.        Mood and Affect: Mood and affect normal.        Speech: Speech normal.        Behavior: Behavior is cooperative.         Assessment & Plan:   Problem List Items Addressed This Visit       Digestive   IBS (irritable bowel syndrome)   Relevant Medications   omeprazole (PRILOSEC) 40 MG capsule   Other Relevant Orders   Amb Referral to Palliative Care   Neurogenic bowel   Relevant Orders   Amb Referral to Palliative Care   Ambulatory referral to Home Health     Nervous and Auditory   MS (multiple sclerosis) (HCC)   Relevant Medications   amantadine (SYMMETREL) 100 MG capsule   baclofen (LIORESAL) 10 MG tablet   Other Relevant Orders   Amb Referral to Palliative Care   Ambulatory referral to Home Health   Seizure disorder [facility])   Relevant Medications   levETIRAcetam (KEPPRA) 500 MG tablet   Other Relevant Orders   Amb Referral to Palliative Care   Ambulatory referral to Home Health   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)   Relevant Medications   amantadine (SYMMETREL) 100 MG capsule   baclofen (LIORESAL) 10 MG tablet   Other Relevant Orders   Amb Referral to Palliative Care   Ambulatory referral to Home Health     Musculoskeletal and Integument   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HCC)     Hematopoietic and Hemostatic   Thrombocytopenia (HCC)     Other   Primary cancer of right female breast (HCC)   Relevant Medications   fluconazole (DIFLUCAN) 150 MG tablet   History of breast cancer in female   Relevant Orders    Amb Referral to Palliative Care   Anemia   Relevant Orders   CBC with Differential/Platelet (Completed)   Neurogenic bladder   Relevant Medications   oxybutynin (DITROPAN-XL) 10 MG 24 hr tablet   Other Relevant Orders   Amb Referral to Palliative Care   Ambulatory referral to Home Health   Neutropenia (HCC)   Relevant Orders   CBC with Differential/Platelet (Completed)   Amb Referral to Palliative Care   Chronic pain syndrome   Relevant Medications   baclofen (LIORESAL) 10 MG tablet   levETIRAcetam (KEPPRA) 500 MG tablet   Other Relevant Orders   Amb Referral to Palliative Care   Ambulatory referral to Home Health  Abnormality of gait   Relevant Orders   Amb Referral to Palliative Care   Ambulatory referral to Home Health   Ambulatory dysfunction   Relevant Orders   Amb Referral to Palliative Care   Ambulatory referral to Home Health   Elevated BP without diagnosis of hypertension   Relevant Medications   amantadine (SYMMETREL) 100 MG capsule   Other Visit Diagnoses     Encounter for examination following treatment at hospital    -  Primary   Lost to primary care even prior to fracture surgery and hospitalization, inpt records reviewed, outpt records requested   Relevant Orders   Amb Referral to Palliative Care   Ambulatory referral to Home Health   Colon cancer screening       GI consult   Relevant Orders   Ambulatory referral to Gastroenterology   Elevated LFTs       Recheck labs   Relevant Orders   COMPLETE METABOLIC PANEL WITH GFR (Completed)   Impaired mobility and ADLs       PT stopped in January 2024, currently unable to transfer from bed to chair, needs lift, home health PT OT eval and treatment   Relevant Orders   Amb Referral to Palliative Care   Ambulatory referral to Home Health   Chronic indwelling Foley catheter       [facility], will need to reestablish with urology, oxybutynin refilled per patient request will need to follow-up with the specialist    Relevant Orders   Amb Referral to Palliative Care   Ambulatory referral to Home Health   Medication refill       Refilled 2 months supply of her medication and which she will then to get from her specialist, benefit outweighs risk, pt MS/seizures-refills per old rx/doses   Relevant Medications   amantadine (SYMMETREL) 100 MG capsule   baclofen (LIORESAL) 10 MG tablet   levETIRAcetam (KEPPRA) 500 MG tablet   omeprazole (PRILOSEC) 40 MG capsule   oxybutynin (DITROPAN-XL) 10 MG 24 hr tablet   Gastroesophageal reflux disease, unspecified whether esophagitis present       pt well controlled when on PPI, refills ordered   Relevant Medications   omeprazole (PRILOSEC) 40 MG capsule       Devices needed - asked for more information from husband and for him to contact us directly since he is not here today for the office visit and he states there are specific devices which she has been discussing with insurance that are approved and will be covered they also state these lift devices were available in the nursing home and was post to go with her as she moved back home but this did not happen.  I have asked for them to call us and give [place] the specific information about what the device is called, what supply company we need to send in the prescription to and we may also need to coordinate with home health PT for medical necessity/use in home etc -since we currently have no records of what has happened to the pt over the past 1.5 years   Discussed with pt's limited mobility and chronic an dprogressive dx that she may consider establishing with palliative care to help with getting care in home, managing meds/sx, improve QAL and current sx management  Return in about 6 months (around 01/07/2023) for Annual Physical (sooner as needed).   Danelle Berry, PA-C 07/07/22 11:22 AM

## 2022-07-08 ENCOUNTER — Telehealth: Payer: Self-pay | Admitting: *Deleted

## 2022-07-08 LAB — COMPLETE METABOLIC PANEL WITH GFR
AG Ratio: 1.2 (calc) (ref 1.0–2.5)
ALT: 31 U/L — ABNORMAL HIGH (ref 6–29)
AST: 24 U/L (ref 10–35)
Albumin: 4 g/dL (ref 3.6–5.1)
Alkaline phosphatase (APISO): 93 U/L (ref 37–153)
BUN/Creatinine Ratio: 31 (calc) — ABNORMAL HIGH (ref 6–22)
BUN: 15 mg/dL (ref 7–25)
CO2: 24 mmol/L (ref 20–32)
Calcium: 9.3 mg/dL (ref 8.6–10.4)
Chloride: 104 mmol/L (ref 98–110)
Creat: 0.48 mg/dL — ABNORMAL LOW (ref 0.50–1.05)
Globulin: 3.3 g/dL (calc) (ref 1.9–3.7)
Glucose, Bld: 86 mg/dL (ref 65–99)
Potassium: 4.4 mmol/L (ref 3.5–5.3)
Sodium: 140 mmol/L (ref 135–146)
Total Bilirubin: 0.3 mg/dL (ref 0.2–1.2)
Total Protein: 7.3 g/dL (ref 6.1–8.1)
eGFR: 107 mL/min/{1.73_m2} (ref >=60)

## 2022-07-08 LAB — CBC WITH DIFFERENTIAL/PLATELET
Absolute Monocytes: 275 cells/uL (ref 200–950)
Basophils Absolute: 20 cells/uL (ref 0–200)
Basophils Relative: 0.6 %
Eosinophils Absolute: 153 cells/uL (ref 15–500)
Eosinophils Relative: 4.5 %
HCT: 39.3 % (ref 35.0–45.0)
Hemoglobin: 13 g/dL (ref 11.7–15.5)
Lymphs Abs: 1289 cells/uL (ref 850–3900)
MCH: 29.8 pg (ref 27.0–33.0)
MCHC: 33.1 g/dL (ref 32.0–36.0)
MCV: 90.1 fL (ref 80.0–100.0)
MPV: 12.5 fL (ref 7.5–12.5)
Monocytes Relative: 8.1 %
Neutro Abs: 1663 cells/uL (ref 1500–7800)
Neutrophils Relative %: 48.9 %
Platelets: 141 10*3/uL (ref 140–400)
RBC: 4.36 10*6/uL (ref 3.80–5.10)
RDW: 12.4 % (ref 11.0–15.0)
Total Lymphocyte: 37.9 %
WBC: 3.4 10*3/uL — ABNORMAL LOW (ref 3.8–10.8)

## 2022-07-08 NOTE — Telephone Encounter (Signed)
Transition Care Management Follow-up Telephone Call Date of discharge and from where: 07/03/2022  [facility] How have you been since you were released from the hospital? Doing jsut fine  Any questions or concerns? Yes Went to dr appts yesterday and has ordered a stand up lift so she will be able to get out soon, has to use [facility] until she can get out of the bed on her own , offered to hace CCM call her about services she said she would talk with them about case management  Items Reviewed: Did the pt receive and understand the discharge instructions provided? Yes  Medications obtained and verified? Yes  Other? No  Any new allergies since your discharge? No  Dietary orders reviewed? No Do you have support at home? Yes     Follow up appointments reviewed:  PCP Hospital f/u appt confirmed? Yes  Saw yesterday  Are transportation arrangements needed?  Not now but she will need help later once she can get into chair herself , has [facility] transportation and then medicaid transportation  If their condition worsens, is the pt aware to call PCP or go to the Emergency Dept.? Yes Was the patient provided with contact information for the PCP's office or ED? Yes Was to pt encouraged to call back with questions or concerns? Yes

## 2022-07-11 ENCOUNTER — Ambulatory Visit: Payer: 59 | Admitting: Family Medicine

## 2022-07-11 ENCOUNTER — Telehealth: Payer: Self-pay

## 2022-07-11 NOTE — Telephone Encounter (Signed)
Pt call left message. She wants to get a refill on Creon. Do you need to see pt before being filled? Or can it be filled and then scheduled appt?

## 2022-07-13 ENCOUNTER — Other Ambulatory Visit: Payer: 59

## 2022-07-13 DIAGNOSIS — Z515 Encounter for palliative care: Secondary | ICD-10-CM

## 2022-07-14 ENCOUNTER — Other Ambulatory Visit: Payer: 59

## 2022-07-14 ENCOUNTER — Telehealth: Payer: Self-pay

## 2022-07-14 ENCOUNTER — Encounter: Payer: Self-pay | Admitting: Family Medicine

## 2022-07-14 NOTE — Telephone Encounter (Signed)
RTC to patient. Patient requesting to cancel today's initial palliative care consult due to spouse feeling under the weather. Visit rescheduled for 6/25 @3pm .

## 2022-07-14 NOTE — Progress Notes (Unsigned)
TELEPHONE ENCOUNTER  PC SW connected with patient to schedule new palliative care visit.  Patient shared that she recently discharged from a SNF in [place], of which she was there for over a year, due to a R leg break. Patient share that since being home she does not have in home assistance at this time. Patient share that her PCP has ordered [facility] PT, OT, and RN for catheter care as well as a stand to sit lift. Patient share that she is really hopeful to get [facility] therapy in place so that she can be able to transfer I with a sliding board.   Plan: in person visit scheduled for Thu 6/6 @130  pm.

## 2022-07-15 DIAGNOSIS — L03116 Cellulitis of left lower limb: Secondary | ICD-10-CM | POA: Diagnosis not present

## 2022-07-15 DIAGNOSIS — M6281 Muscle weakness (generalized): Secondary | ICD-10-CM | POA: Diagnosis not present

## 2022-07-15 DIAGNOSIS — R262 Difficulty in walking, not elsewhere classified: Secondary | ICD-10-CM | POA: Diagnosis not present

## 2022-07-22 ENCOUNTER — Inpatient Hospital Stay: Payer: 59 | Admitting: Medical Oncology

## 2022-07-22 ENCOUNTER — Inpatient Hospital Stay: Payer: 59

## 2022-07-25 ENCOUNTER — Other Ambulatory Visit: Payer: Self-pay

## 2022-07-25 DIAGNOSIS — G35D Multiple sclerosis, unspecified: Secondary | ICD-10-CM

## 2022-07-25 DIAGNOSIS — G35 Multiple sclerosis: Secondary | ICD-10-CM

## 2022-07-25 DIAGNOSIS — IMO0002 Reserved for concepts with insufficient information to code with codable children: Secondary | ICD-10-CM

## 2022-07-25 DIAGNOSIS — G40909 Epilepsy, unspecified, not intractable, without status epilepticus: Secondary | ICD-10-CM

## 2022-07-26 ENCOUNTER — Encounter: Payer: Self-pay | Admitting: Diagnostic Neuroimaging

## 2022-07-26 ENCOUNTER — Encounter: Payer: Self-pay | Admitting: Family Medicine

## 2022-07-27 ENCOUNTER — Ambulatory Visit: Payer: 59 | Admitting: Physician Assistant

## 2022-07-27 NOTE — Telephone Encounter (Signed)
Request records faxed

## 2022-07-29 ENCOUNTER — Other Ambulatory Visit: Payer: Self-pay

## 2022-07-29 ENCOUNTER — Emergency Department
Admission: EM | Admit: 2022-07-29 | Discharge: 2022-07-29 | Disposition: A | Discharge status: Home / Self Care | Payer: 59 | Attending: Student in an Organized Health Care Education/Training Program | Admitting: Student in an Organized Health Care Education/Training Program

## 2022-07-29 ENCOUNTER — Encounter: Payer: Self-pay | Admitting: Emergency Medicine

## 2022-07-29 DIAGNOSIS — I10 Essential (primary) hypertension: Secondary | ICD-10-CM | POA: Diagnosis not present

## 2022-07-29 DIAGNOSIS — Y732 Prosthetic and other implants, materials and accessory gastroenterology and urology devices associated with adverse incidents: Secondary | ICD-10-CM | POA: Diagnosis not present

## 2022-07-29 DIAGNOSIS — T83091A Other mechanical complication of indwelling urethral catheter, initial encounter: Secondary | ICD-10-CM | POA: Diagnosis not present

## 2022-07-29 DIAGNOSIS — Z7401 Bed confinement status: Secondary | ICD-10-CM | POA: Diagnosis not present

## 2022-07-29 DIAGNOSIS — R69 Illness, unspecified: Secondary | ICD-10-CM | POA: Diagnosis not present

## 2022-07-29 DIAGNOSIS — T839XXA Unspecified complication of genitourinary prosthetic device, implant and graft, initial encounter: Secondary | ICD-10-CM

## 2022-07-29 DIAGNOSIS — Z743 Need for continuous supervision: Secondary | ICD-10-CM | POA: Diagnosis not present

## 2022-07-29 NOTE — ED Notes (Signed)
[facility] called for transport to home 

## 2022-07-29 NOTE — ED Notes (Signed)
Pt requesting for 5cc's of fluids to be removed from catheter balloon. Pt's states that her bladder only holds 5-101mL's of urine. PA notified. PA attempted to explain to patient that the catheter needs 10cc's of fluids to keep from coming out. Pt insisted on removal of the 5cc's from the balloon. Explained to patient that this is against medical advice. Pt reported understanding, but requested that 5cc's still be removed. Per PA 5cc's removed from catheter balloon at this time.

## 2022-07-29 NOTE — ED Provider Notes (Signed)
Patient awaiting transport.  Patient has requesting that we decrease the amount of fluid in the balloon for her catheter.  Explained that this is required to maintain catheter in place so that it cannot be accidentally removed.  Patient acknowledges this but states that she had a bladder test done in [place] and she can only hold 10 mL of fluid in her bladder and states that if we do not decrease her balloon volume to 5 she is going to request that we take out the catheter and she will come back as soon as she gets home because she would not be able to urinate.  I have informed her that this is AGAINST MEDICAL ADVICE and she verbalizes understanding of same.  Patient verbalizes that she still wants 5 mL of fluid in the Foley balloon.  Again I stressed why we would like to keep 10 mL in the balloon and she verbalizes understanding but adamantly will not allow [place] to keep the 10 mL.  As such patient is agreeable to signing AGAINST MEDICAL ADVICE and has 5 mL of saline removed from the Foley balloon.  Nursing is present for this discussion and is a witness as well.   Lanette Hampshire 07/29/22 Corie Chiquito, MD 08/02/22 [phone]

## 2022-07-29 NOTE — ED Provider Notes (Signed)
   [facility] Provider Note    Event Date/Time   First MD Initiated Contact with Patient 07/29/22 1628     (approximate)   History   Catheter Issue   HPI  Rebecca [name] is a 62 y.o. female to the ER for evaluation of urinary incontinence despite Foley catheter.  Has had issues with Foley catheter becoming clogged in the past.  She denies any fevers no abdominal pain.  Is getting home health established this week so they can help manage the Foley catheter at home.  She has had follow-up with urology since our last encounter and they are in consideration of suprapubic catheter.  They have not made a decision yet.  She has no other complaints or concerns.     Physical Exam   Triage Vital Signs: ED Triage Vitals [07/29/22 1656]  Enc Vitals Group     BP      Pulse      Resp      Temp      Temp src      SpO2      Weight      Height      Head Circumference      Peak Flow      Pain Score 0     Pain Loc      Pain Edu?      Excl. in GC?     Most recent vital signs: There were no vitals filed for this visit.   Constitutional: Alert  Eyes: Conjunctivae are normal.  Head: Atraumatic. Nose: No congestion/rhinnorhea. Mouth/Throat: Mucous membranes are moist.   Neck: Painless ROM.  Cardiovascular:   Good peripheral circulation. Respiratory: Normal respiratory effort.  No retractions.  Gastrointestinal: Soft and nontender.  Musculoskeletal:  no deformity Neurologic:  MAE spontaneously. No gross focal neurologic deficits are appreciated.  Skin:  Skin is warm, dry and intact. No rash noted. Psychiatric: Mood and affect are normal. Speech and behavior are normal.    ED Results / Procedures / Treatments   Labs (all labs ordered are listed, but only abnormal results are displayed) Labs Reviewed - No data to display   EKG     RADIOLOGY    PROCEDURES:  Critical Care performed:   Procedures   MEDICATIONS ORDERED IN ED: Medications -  No data to display   IMPRESSION / MDM / ASSESSMENT AND PLAN / ED COURSE  I reviewed the triage vital signs and the nursing notes.                              Differential diagnosis includes, but is not limited to, malposition Foley catheter, retention, chronic indwelling Foley  Patient presents to the ER in the setting of chronic indwelling Foley for urinary incontinence.  Patient has been having these issues for more than a day now.  Foley catheter exchanged with improvement in symptoms.  Does appear stable and appropriate for outpatient follow-up.       FINAL CLINICAL IMPRESSION(S) / ED DIAGNOSES   Final diagnoses:  Foley catheter problem, initial encounter [facility])     Rx / DC Orders   ED Discharge Orders     None        Note:  This document was prepared using Dragon voice recognition software and may include unintentional dictation errors.    Willy Eddy, MD 07/29/22 [phone]

## 2022-07-29 NOTE — ED Notes (Signed)
Pt verbalizes understanding of discharge instructions. Opportunity for questioning and answers were provided. Pt discharged from ED to home with [facility].

## 2022-07-29 NOTE — ED Triage Notes (Signed)
Pt via [facility] from home. Pt here for catheter replacement, states that she felt like her cath was not in the right place, states she felt discomfort. Pt was covered in feces. States her home health aid did not show up yesterday. Pt is A&Ox4 and NAD

## 2022-07-30 ENCOUNTER — Other Ambulatory Visit: Payer: Self-pay | Admitting: Family Medicine

## 2022-07-30 DIAGNOSIS — Z760 Encounter for issue of repeat prescription: Secondary | ICD-10-CM

## 2022-07-30 DIAGNOSIS — G40909 Epilepsy, unspecified, not intractable, without status epilepticus: Secondary | ICD-10-CM

## 2022-08-01 ENCOUNTER — Encounter: Payer: Self-pay | Admitting: Family Medicine

## 2022-08-01 ENCOUNTER — Telehealth: Payer: Self-pay

## 2022-08-01 ENCOUNTER — Other Ambulatory Visit: Payer: Self-pay | Admitting: Neurology

## 2022-08-01 DIAGNOSIS — Z981 Arthrodesis status: Secondary | ICD-10-CM | POA: Diagnosis not present

## 2022-08-01 DIAGNOSIS — I890 Lymphedema, not elsewhere classified: Secondary | ICD-10-CM | POA: Diagnosis not present

## 2022-08-01 DIAGNOSIS — G894 Chronic pain syndrome: Secondary | ICD-10-CM | POA: Diagnosis not present

## 2022-08-01 DIAGNOSIS — D709 Neutropenia, unspecified: Secondary | ICD-10-CM | POA: Diagnosis not present

## 2022-08-01 DIAGNOSIS — H6123 Impacted cerumen, bilateral: Secondary | ICD-10-CM | POA: Diagnosis not present

## 2022-08-01 DIAGNOSIS — Z466 Encounter for fitting and adjustment of urinary device: Secondary | ICD-10-CM | POA: Diagnosis not present

## 2022-08-01 DIAGNOSIS — I10 Essential (primary) hypertension: Secondary | ICD-10-CM | POA: Diagnosis not present

## 2022-08-01 DIAGNOSIS — E119 Type 2 diabetes mellitus without complications: Secondary | ICD-10-CM | POA: Diagnosis not present

## 2022-08-01 DIAGNOSIS — M542 Cervicalgia: Secondary | ICD-10-CM | POA: Diagnosis not present

## 2022-08-01 DIAGNOSIS — N319 Neuromuscular dysfunction of bladder, unspecified: Secondary | ICD-10-CM | POA: Diagnosis not present

## 2022-08-01 DIAGNOSIS — R1310 Dysphagia, unspecified: Secondary | ICD-10-CM | POA: Diagnosis not present

## 2022-08-01 DIAGNOSIS — G35 Multiple sclerosis: Secondary | ICD-10-CM | POA: Diagnosis not present

## 2022-08-01 DIAGNOSIS — Z853 Personal history of malignant neoplasm of breast: Secondary | ICD-10-CM | POA: Diagnosis not present

## 2022-08-01 DIAGNOSIS — D649 Anemia, unspecified: Secondary | ICD-10-CM | POA: Diagnosis not present

## 2022-08-01 DIAGNOSIS — G801 Spastic diplegic cerebral palsy: Secondary | ICD-10-CM | POA: Diagnosis not present

## 2022-08-01 DIAGNOSIS — K222 Esophageal obstruction: Secondary | ICD-10-CM | POA: Diagnosis not present

## 2022-08-01 DIAGNOSIS — K219 Gastro-esophageal reflux disease without esophagitis: Secondary | ICD-10-CM | POA: Diagnosis not present

## 2022-08-01 DIAGNOSIS — H900 Conductive hearing loss, bilateral: Secondary | ICD-10-CM | POA: Diagnosis not present

## 2022-08-01 DIAGNOSIS — K589 Irritable bowel syndrome without diarrhea: Secondary | ICD-10-CM | POA: Diagnosis not present

## 2022-08-01 DIAGNOSIS — K8689 Other specified diseases of pancreas: Secondary | ICD-10-CM | POA: Diagnosis not present

## 2022-08-01 DIAGNOSIS — D696 Thrombocytopenia, unspecified: Secondary | ICD-10-CM | POA: Diagnosis not present

## 2022-08-01 DIAGNOSIS — K592 Neurogenic bowel, not elsewhere classified: Secondary | ICD-10-CM | POA: Diagnosis not present

## 2022-08-01 DIAGNOSIS — J309 Allergic rhinitis, unspecified: Secondary | ICD-10-CM | POA: Diagnosis not present

## 2022-08-01 MED ORDER — REBIF REBIDOSE 44 MCG/0.5ML ~~LOC~~ SOAJ: SUBCUTANEOUS | 0 refills | Status: DC

## 2022-08-01 NOTE — Telephone Encounter (Signed)
Patient would like to schedule her colonoscopy at a later time.  Informed her that I will send her a reminder letter in the mail and she can call back when she is ready to schedule.  Thanks, [place], [place]

## 2022-08-02 ENCOUNTER — Telehealth: Payer: Self-pay | Admitting: *Deleted

## 2022-08-02 ENCOUNTER — Other Ambulatory Visit: Payer: 59

## 2022-08-02 DIAGNOSIS — K8689 Other specified diseases of pancreas: Secondary | ICD-10-CM | POA: Diagnosis not present

## 2022-08-02 DIAGNOSIS — Z981 Arthrodesis status: Secondary | ICD-10-CM | POA: Diagnosis not present

## 2022-08-02 DIAGNOSIS — K222 Esophageal obstruction: Secondary | ICD-10-CM | POA: Diagnosis not present

## 2022-08-02 DIAGNOSIS — G35 Multiple sclerosis: Secondary | ICD-10-CM | POA: Diagnosis not present

## 2022-08-02 DIAGNOSIS — D696 Thrombocytopenia, unspecified: Secondary | ICD-10-CM | POA: Diagnosis not present

## 2022-08-02 DIAGNOSIS — K589 Irritable bowel syndrome without diarrhea: Secondary | ICD-10-CM | POA: Diagnosis not present

## 2022-08-02 DIAGNOSIS — I890 Lymphedema, not elsewhere classified: Secondary | ICD-10-CM | POA: Diagnosis not present

## 2022-08-02 DIAGNOSIS — N319 Neuromuscular dysfunction of bladder, unspecified: Secondary | ICD-10-CM | POA: Diagnosis not present

## 2022-08-02 DIAGNOSIS — D649 Anemia, unspecified: Secondary | ICD-10-CM | POA: Diagnosis not present

## 2022-08-02 DIAGNOSIS — G801 Spastic diplegic cerebral palsy: Secondary | ICD-10-CM | POA: Diagnosis not present

## 2022-08-02 DIAGNOSIS — H6123 Impacted cerumen, bilateral: Secondary | ICD-10-CM | POA: Diagnosis not present

## 2022-08-02 DIAGNOSIS — E119 Type 2 diabetes mellitus without complications: Secondary | ICD-10-CM | POA: Diagnosis not present

## 2022-08-02 DIAGNOSIS — K592 Neurogenic bowel, not elsewhere classified: Secondary | ICD-10-CM | POA: Diagnosis not present

## 2022-08-02 DIAGNOSIS — Z853 Personal history of malignant neoplasm of breast: Secondary | ICD-10-CM | POA: Diagnosis not present

## 2022-08-02 DIAGNOSIS — R1310 Dysphagia, unspecified: Secondary | ICD-10-CM | POA: Diagnosis not present

## 2022-08-02 DIAGNOSIS — M542 Cervicalgia: Secondary | ICD-10-CM | POA: Diagnosis not present

## 2022-08-02 DIAGNOSIS — D709 Neutropenia, unspecified: Secondary | ICD-10-CM | POA: Diagnosis not present

## 2022-08-02 DIAGNOSIS — Z466 Encounter for fitting and adjustment of urinary device: Secondary | ICD-10-CM | POA: Diagnosis not present

## 2022-08-02 DIAGNOSIS — K219 Gastro-esophageal reflux disease without esophagitis: Secondary | ICD-10-CM | POA: Diagnosis not present

## 2022-08-02 DIAGNOSIS — I10 Essential (primary) hypertension: Secondary | ICD-10-CM | POA: Diagnosis not present

## 2022-08-02 DIAGNOSIS — G894 Chronic pain syndrome: Secondary | ICD-10-CM | POA: Diagnosis not present

## 2022-08-02 DIAGNOSIS — J309 Allergic rhinitis, unspecified: Secondary | ICD-10-CM | POA: Diagnosis not present

## 2022-08-02 DIAGNOSIS — H900 Conductive hearing loss, bilateral: Secondary | ICD-10-CM | POA: Diagnosis not present

## 2022-08-02 NOTE — Telephone Encounter (Signed)
Call from home health Nurse reporting that per husband, it is difficult to get patient into ambulance and brought here for lab draw and port flushes. He is asking if he could get ordered to do lab draws and port flushes at her home. Please advise. Of note, she is scheduled to come in 6/28 for port flush and see PA

## 2022-08-03 ENCOUNTER — Telehealth: Payer: Self-pay | Admitting: Family Medicine

## 2022-08-03 NOTE — Telephone Encounter (Signed)
Spoke with patient's home health nurse-Jeff- [facility]. Pt has not be able to stand. It would take EMS to transport pt non emergent to cancer center. She requires a Nurse, adult. Dr. Angelica Chessman has ordered [facility]. Pt recently broke her leg and was recently d/c from a facility. Pt will be under [facility] services until the middle of August. Jeff, a former, 1C oncology RN, can access pt's port if needed for any labs. I explained that no labs - are needed. Per Leotis Shames. Ok to post pone port flush at this time and have a virtual visit on Friday with Sarah. We can always delay port flush or have the port removed if its not medically necessary. Trey Paula thanked me for returning his phone call. I also reached out to the pt to discuss this apt concern. Pt agreeable to cnl the port flush apt. She states that she kept the port previously due to poor venous peripheral access. Her current MS drugs are injections/oral meds and not intravenous. Due to limited mobility. Pt is ok with a virtual visit with Maralyn Sago on Friday. She understands that the port flush can be r/s on a different day in future.

## 2022-08-03 NOTE — Telephone Encounter (Signed)
Home Health Verbal Orders - Caller/[facility] Callback Number: [phone] Requesting OT/PT/Skilled Nursing/Social Work/Speech Therapy: PT  Frequency:   1w9

## 2022-08-04 NOTE — Telephone Encounter (Signed)
VO given.

## 2022-08-05 ENCOUNTER — Inpatient Hospital Stay: Payer: 59 | Attending: Oncology | Admitting: Medical Oncology

## 2022-08-05 ENCOUNTER — Encounter: Payer: Self-pay | Admitting: Medical Oncology

## 2022-08-05 ENCOUNTER — Inpatient Hospital Stay: Payer: 59

## 2022-08-05 DIAGNOSIS — C50911 Malignant neoplasm of unspecified site of right female breast: Secondary | ICD-10-CM | POA: Diagnosis not present

## 2022-08-05 DIAGNOSIS — Z95828 Presence of other vascular implants and grafts: Secondary | ICD-10-CM

## 2022-08-05 NOTE — Progress Notes (Signed)
Virtual Visit Progress Note  Ms. Ticas,you are scheduled for a virtual visit with your provider today.    Just as we do with appointments in the office, we must obtain your consent to participate.  Your consent will be active for this visit and any virtual visit you may have with one of our providers in the next 365 days.    If you have a MyChart account, I can also send a copy of this consent to you electronically.  All virtual visits are billed to your insurance company just like a traditional visit in the office.  As this is a virtual visit, video technology does not allow for your provider to perform a traditional examination.  This may limit your provider's ability to fully assess your condition.  If your provider identifies any concerns that need to be evaluated in person or the need to arrange testing such as labs, EKG, etc, we will make arrangements to do so.    Although advances in technology are sophisticated, we cannot ensure that it will always work on either your end or our end.  If the connection with a video visit is poor, we may have to switch to a telephone visit.  With either a video or telephone visit, we are not always able to ensure that we have a secure connection.   I need to obtain your verbal consent now.   Are you willing to proceed with your visit today?   GLENNETTA VIEWEG has provided verbal consent on 08/05/2022 for a virtual visit (video or telephone).   Rushie Chestnut, PA-C 08/05/2022  10:24 AM    I connected with Delfina Redwood on 08/05/22 at 10:30 AM EDT by video enabled telemedicine visit and verified that I am speaking with the correct person using two identifiers.   I discussed the limitations, risks, security and privacy concerns of performing an evaluation and management service by telemedicine and the availability of in-person appointments. I also discussed with the patient that there may be a patient responsible charge related to this service. The patient  expressed understanding and agreed to proceed.   Other persons participating in the visit and their role in the encounter: None   Patient's location: Home Provider's location: Clinic   Chief Complaint: Yearly follow up and to discuss port-a-cath     Patient Care Team: Danelle Berry, PA-C as PCP - General (Family Medicine) Edwena Felty, MD as Referring Physician Byrnett, Merrily Pew, MD (General Surgery) Suanne Marker, MD as Consulting Physician (Neurology) Jeralyn Ruths, MD as Consulting Physician (Oncology) Marykay Lex, MD as Consulting Physician (Cardiology) Mariah Milling Tollie Pizza, MD as Consulting Physician (Cardiology) Hulan Fray as Physician Assistant (Urology) Andee Poles, [facility] (Inactive) (Pharmacist)   Name of the patient: Rebecca [name]  [id number]  10-Aug-1960   Date of visit: 08/05/22  Oncological History: 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 BRCA negative.   Oncologist: Dr. Gerarda Fraction, MD  History of Presenting Illness: Patient presents virtually for her annual visit. Mainly she wishes to discuss her port-a-cath. She is no longer receiving treatment and is considering having this removed but is not quite sure as she has a history of poor IV access. Her question today is when she is due for her next port-a-cath flush. Her last port flush was on 06/23/2022.   In terms of her breast cancer surveillance she reports no changes of concern. She denies any breast lumps, bumps, discharge, skin  changes, bone pain. No unintentional weight loss or night sweats. Her last mammogram was in Oct 2023 and was non-concerning. Given her triple negative status she is not on an aromatase inhibitor.    Review of systems- ROS  As stated above in HPI  Allergies  Allergen Reactions   Fentanyl Nausea And Vomiting and Nausea Only    vomiting Was given in PACU x3 each time patient got sick    Sulfa Antibiotics Hives and Other (See  Comments)    Light headed, over heated    Past Medical History:  Diagnosis Date   Allergy    Aortic valve disease    Mild AS / AI - most recent Echo demonstrated tricuspid aortic valve.   Bacterial endocarditis    History of .   Bilateral lower extremity edema    Noncardiac.  Chronic. LE Venous dopplers - negative for DVT.; Echocardiogram January 2016: Normal EF with normal wall motion and valve function. Only grade 1 diastolic dysfunction. EF 60-65%. Mild MR   Breast cancer (HCC) 12/31/2013   Right breast, 12:00, 1.5 cm, T1c,N0 invasive mammary carcinoma, triple negative. --> Rx with Chemo   Cervical stenosis of spine    GERD (gastroesophageal reflux disease) April or May 2022   Heart murmur 2013   Herpes zoster    IBS (irritable bowel syndrome)    Lymphedema    has legs wrapped at [facility]   Multiple sclerosis (HCC) 2001   Walks from room to room @ home; but Wheelchair when going out.   Neuromuscular disorder (HCC)    MS   PONV (postoperative nausea and vomiting)    Related to Fentanyl   Seizures (HCC)    Takes Keppra   Syncope and collapse     Past Surgical History:  Procedure Laterality Date   ANKLE SURGERY     Left   ANKLE SURGERY     ANTERIOR CERVICAL DECOMP/DISCECTOMY FUSION  11/17/2011   Procedure: ANTERIOR CERVICAL DECOMPRESSION/DISCECTOMY FUSION 2 LEVELS;  Surgeon: Maeola Harman, MD;  Location: [facility];  Service: Neurosurgery;  Laterality: N/A;  Cervical Five-Six Six-Seven Anterior cervical decompression/diskectomy/fusion   BREAST BIOPSY Right 12/31/2013   invasive mammary   BREAST SURGERY Right 02/03/2014   Right simple mastectomy with sentinel node biopsy.   CHOLECYSTECTOMY     COLONOSCOPY  2014   ESOPHAGOGASTRODUODENOSCOPY (EGD) WITH PROPOFOL N/A 07/16/2020   Procedure: ESOPHAGOGASTRODUODENOSCOPY (EGD) WITH PROPOFOL;  Surgeon: Pasty Spillers, MD;  Location: [facility] ENDOSCOPY;  Service: Endoscopy;  Laterality: N/A;  Patient has MS and will need assistance    HYSTEROSCOPY WITH D & C N/A 11/27/2018   Procedure: DILATATION AND CURETTAGE /HYSTEROSCOPY;  Surgeon: Nadara Mustard, MD;  Location: [facility] ORS;  Service: Gynecology;  Laterality: N/A;   Lower extremity venous Dopplers  02/27/2013   No LE DVT   MASTECTOMY Right 2015   ORIF TIBIA PLATEAU Left 12/09/2020   Procedure: LEFT OPEN REDUCTION INTERNAL FIXATION (ORIF) TIBIAL PLATEAU;  Surgeon: Roby Lofts, MD;  Location: [facility] OR;  Service: Orthopedics;  Laterality: Left;   Port a cath insertion Right 01/19/2010   PORT-A-CATH REMOVAL     right   PORT-A-CATH REMOVAL Right 09/03/2013   Procedure: REMOVAL PORT-A-CATH;  Surgeon: Fransisco Hertz, MD;  [facility] OR;  Service: Vascular;  Laterality: Right;   SPINE SURGERY  Cervical steinois Dr. Venetia Maxon 2014 I think?   TONSILLECTOMY     TRANSTHORACIC ECHOCARDIOGRAM  03/2013; 02/2014   a) Normal LV size and function with  EF 60-65%.; Cannot exclude bicuspid aortic valve with mild AS and mild AI.; b) Normal EF with normal wall motion and valve function x Mild MR. G2 DD. EF 60-65%. Tricuspid AoV   UPPER GI ENDOSCOPY  2014    Social History   Socioeconomic History   Marital status: Married    Spouse name: don   Number of children: 0   Years of education: 12   Highest education level: 12th grade  Occupational History   Occupation: disability  Tobacco Use   Smoking status: Never   Smokeless tobacco: Never   Tobacco comments:    Never smoked. My mom smoked. Passed 04/18/20. Dad smoked, quit 1973. Sister smokes.  Vaping Use   Vaping Use: Never used  Substance and Sexual Activity   Alcohol use: No   Drug use: No   Sexual activity: Not Currently    Partners: Male    Birth control/protection: None  Other Topics Concern   Not on file  Social History Narrative   She is married. Recently moved back to [place] after being in [place] for some time. She is accompanied by her husband and aunt.   Never smoked. Never used alcohol.   Social  Determinants of Health   Financial Resource Strain: Low Risk  (08/13/2020)   Overall Financial Resource Strain (CARDIA)    Difficulty of Paying Living Expenses: Not hard at all  Food Insecurity: No Food Insecurity (06/16/2022)   Hunger Vital Sign    Worried About Running Out of Food in the Last Year: Never true    Ran Out of Food in the Last Year: Never true  Transportation Needs: No Transportation Needs (06/16/2022)   PRAPARE - Administrator, Civil Service (Medical): No    Lack of Transportation (Non-Medical): No  Physical Activity: Unknown (06/16/2022)   Exercise Vital Sign    Days of Exercise per Week: 0 days    Minutes of Exercise per Session: Not on file  Stress: No Stress Concern Present (06/16/2022)   [employer] of Occupational Health - Occupational Stress Questionnaire    Feeling of Stress : Only a little  Social Connections: Moderately Isolated (06/16/2022)   Social Connection and Isolation Panel [NHANES]    Frequency of Communication with Friends and Family: Once a week    Frequency of Social Gatherings with Friends and Family: Once a week    Attends Religious Services: 1 to 4 times per year    Active Member of [employer] or Organizations: No    Attends Banker Meetings: Not on file    Marital Status: Married  Catering manager Violence: Not At Risk (08/13/2020)   Humiliation, Afraid, Rape, and Kick questionnaire    Fear of Current or Ex-Partner: No    Emotionally Abused: No    Physically Abused: No    Sexually Abused: No    Immunization History  Administered Date(s) Administered   Influenza,inj,Quad PF,6+ Mos 10/24/2014, 11/25/2015, 10/28/2016, 12/26/2017, 11/20/2018, 12/01/2020   Moderna Sars-Covid-2 Vaccination 07/02/2019, 07/31/2019   Pneumococcal Polysaccharide-23 02/03/2018   Tdap 04/09/2012   Unspecified SARS-COV-2 Vaccination 06/07/2021    Family History  Problem Relation Age of Onset   Cancer Father        skin   Heart disease Father     Heart attack Father        heart attack in his 31's   Thyroid disease Sister    Ovarian cancer Cousin    Breast cancer Maternal Aunt 20  Breast cancer Maternal Grandmother 74   Bladder Cancer Neg Hx    Kidney cancer Neg Hx      Current Outpatient Medications:    amantadine (SYMMETREL) 100 MG capsule, TAKE 1 CAPSULE BY MOUTH THREE TIMES A DAY, Disp: 180 capsule, Rfl: 0   baclofen (LIORESAL) 10 MG tablet, Take 1-2 tablets (10-20 mg total) by mouth 3 (three) times daily as needed for muscle spasms., Disp: 120 tablet, Rfl: 1   Disposable Gloves (ASSURANCE VINYL EXAM GLOVES) MISC, Neurogenic bladder; MS; LON 99 months; for use for personal hygiene, Disp: 50 each, Rfl: 11   fluconazole (DIFLUCAN) 150 MG tablet, Take 150 mg by mouth once., Disp: , Rfl:    gluconic acid-citric acid (RENACIDIN) irrigation, Insert 30 mL into suprapubic tube and clamp tube for 10 minutes and then drain twice daily, Disp: 500 mL, Rfl: 1   Glucosamine-Chondroitin (COSAMIN DS PO), Take 1 tablet by mouth 5 (five) times daily., Disp: , Rfl:    ibuprofen (ADVIL) 600 MG tablet, TAKE 1 TABLET BY MOUTH EVERY 8 HOURS AS NEEDED. (Patient taking differently: Take 600 mg by mouth every 8 (eight) hours as needed for moderate pain, mild pain or headache.), Disp: 90 tablet, Rfl: 3   levETIRAcetam (KEPPRA) 500 MG tablet, Take 1 tablet (500 mg total) by mouth 2 (two) times daily., Disp: 60 tablet, Rfl: 0   lidocaine-prilocaine (EMLA) cream, Apply 1 application topically as needed., Disp: 30 g, Rfl: 0   lipase/protease/amylase (CREON) 36000 UNITS CPEP capsule, TAKE 1 CAPSULE BY MOUTH WITH BREAKFAST, WITH LUNCH, AND WITH EVENING MEAL AND 1 CAPSULE WITH SNACKS. (Patient taking differently: Take 36,000 Units by mouth 3 (three) times daily with meals.), Disp: 540 capsule, Rfl: 2   loratadine (CLARITIN) 10 MG tablet, Take 10 mg by mouth daily., Disp: , Rfl:    Misc Natural Products (LEG VEIN & CIRCULATION) TABS, Take 1 tablet by mouth 2 (two)  times daily. , Disp: , Rfl:    Multiple Vitamins-Minerals (HAIR SKIN AND NAILS FORMULA) TABS, Take 1 tablet by mouth 2 (two) times daily. , Disp: , Rfl:    Multiple Vitamins-Minerals (MULTIVITAMIN PO), Take 1 tablet by mouth daily. , Disp: , Rfl:    nystatin (MYCOSTATIN/NYSTOP) powder, Apply 1 application topically 3 (three) times daily as needed. For rash/raw skin as needed, Disp: 15 g, Rfl: 2   omeprazole (PRILOSEC) 40 MG capsule, Take 1 capsule (40 mg total) by mouth daily., Disp: 90 capsule, Rfl: 1   oxybutynin (DITROPAN-XL) 10 MG 24 hr tablet, Take 1 tablet (10 mg total) by mouth 2 (two) times daily., Disp: 120 tablet, Rfl: 0   REBIF REBIDOSE 44 MCG/0.5ML SOAJ, Inject  44 mcg Sub-q 3 times weekly Rotate site after each injection, Disp: 12 mL, Rfl: 0   tiZANidine (ZANAFLEX) 4 MG tablet, TAKE 1 TABLET (4 MG TOTAL) BY MOUTH 2 (TWO) TIMES DAILY. (Patient taking differently: Take 4 mg by mouth in the morning and at bedtime.), Disp: 180 tablet, Rfl: 1   Turmeric 500 MG CAPS, Take 500 mg by mouth daily., Disp: , Rfl:   Physical exam: Exam limited due to telemedicine Physical Exam Nursing note reviewed.  Constitutional:      General: She is not in acute distress.    Appearance: Normal appearance. She is not ill-appearing, toxic-appearing or diaphoretic.  Neurological:     General: No focal deficit present.     Mental Status: She is alert.       Assessment and plan- Patient is  a 62 y.o. female with a history of stage Ia triple negative adenocarcinoma of the right breast. BRCA negative.   Breast Cancer: Triple negative right breast cancer: No evidence of disease. Given her difficulties with Taxol and multiple sclerosis, treatment was discontinued after a total of 6 of 12 cycles of Taxol. Her last chemotherapy was July 17, 2014. Because patient had a full mastectomy, she did not require adjuvant XRT. Given the fact that she is a triple negative cancer, she does not require an aromatase inhibitor.  Her last mammogram was on 11/08/2021 and was BI-RADS category 2 benign. She is due for her CA 27-29 blood work which she will have at her next port-a-cath flush visit for patients convenience. As per the recommendations of Dr. Orlie Dakin she is to have yearly visits through 2025.   Port-A-Cath: Chronic placement. We discussed risks/benefits. She is considering removal and will contact us as needed. For now she will continue port flushes every 6-8 weeks.   Disposition:   Port flush every 8 weeks (Last port flush was on 06/23/2022) At her next port flush please add port labs ( CBC w/, CA 27-29) Mammogram order placed and needs to be scheduled  RTC 1 year MD, port labs ( CBC w/, CA 27-29)-   Visit Diagnosis 1. Primary cancer of right female breast (HCC)   2. Port-A-Cath in place     Patient expressed understanding and was in agreement with this plan. She also understands that She can call clinic at any time with any questions, concerns, or complaints.   I discussed the assessment and treatment plan with the patient. The patient was provided an opportunity to ask questions and all were answered. The patient agreed with the plan and demonstrated an understanding of the instructions.   The patient was advised to call back or seek an in-person evaluation if the symptoms worsen or if the condition fails to improve as anticipated.   I spent 10 minutes face-to-face video visit time dedicated to the care of this patient on the date of this encounter to include pre-visit review of her last oncology note, port-a-cath flush visits, last mammogram, and last CA 27-29 lab, face-to-face time with the patient, and post visit ordering of testing/documentation.   Thank you for allowing me to participate in the care of this very pleasant patient.   Clent Jacks PA-C

## 2022-08-05 NOTE — Progress Notes (Signed)
Questions about when she is supposed to get her next port flush.

## 2022-08-07 ENCOUNTER — Other Ambulatory Visit: Payer: Self-pay | Admitting: Family Medicine

## 2022-08-07 DIAGNOSIS — N319 Neuromuscular dysfunction of bladder, unspecified: Secondary | ICD-10-CM

## 2022-08-07 DIAGNOSIS — Z760 Encounter for issue of repeat prescription: Secondary | ICD-10-CM

## 2022-08-10 DIAGNOSIS — G801 Spastic diplegic cerebral palsy: Secondary | ICD-10-CM | POA: Diagnosis not present

## 2022-08-10 DIAGNOSIS — K589 Irritable bowel syndrome without diarrhea: Secondary | ICD-10-CM | POA: Diagnosis not present

## 2022-08-10 DIAGNOSIS — Z981 Arthrodesis status: Secondary | ICD-10-CM | POA: Diagnosis not present

## 2022-08-10 DIAGNOSIS — H6123 Impacted cerumen, bilateral: Secondary | ICD-10-CM | POA: Diagnosis not present

## 2022-08-10 DIAGNOSIS — K222 Esophageal obstruction: Secondary | ICD-10-CM | POA: Diagnosis not present

## 2022-08-10 DIAGNOSIS — M542 Cervicalgia: Secondary | ICD-10-CM | POA: Diagnosis not present

## 2022-08-10 DIAGNOSIS — G894 Chronic pain syndrome: Secondary | ICD-10-CM | POA: Diagnosis not present

## 2022-08-10 DIAGNOSIS — I10 Essential (primary) hypertension: Secondary | ICD-10-CM | POA: Diagnosis not present

## 2022-08-10 DIAGNOSIS — D709 Neutropenia, unspecified: Secondary | ICD-10-CM | POA: Diagnosis not present

## 2022-08-10 DIAGNOSIS — N319 Neuromuscular dysfunction of bladder, unspecified: Secondary | ICD-10-CM | POA: Diagnosis not present

## 2022-08-10 DIAGNOSIS — Z853 Personal history of malignant neoplasm of breast: Secondary | ICD-10-CM | POA: Diagnosis not present

## 2022-08-10 DIAGNOSIS — I890 Lymphedema, not elsewhere classified: Secondary | ICD-10-CM | POA: Diagnosis not present

## 2022-08-10 DIAGNOSIS — H900 Conductive hearing loss, bilateral: Secondary | ICD-10-CM | POA: Diagnosis not present

## 2022-08-10 DIAGNOSIS — R1310 Dysphagia, unspecified: Secondary | ICD-10-CM | POA: Diagnosis not present

## 2022-08-10 DIAGNOSIS — K219 Gastro-esophageal reflux disease without esophagitis: Secondary | ICD-10-CM | POA: Diagnosis not present

## 2022-08-10 DIAGNOSIS — D696 Thrombocytopenia, unspecified: Secondary | ICD-10-CM | POA: Diagnosis not present

## 2022-08-10 DIAGNOSIS — K592 Neurogenic bowel, not elsewhere classified: Secondary | ICD-10-CM | POA: Diagnosis not present

## 2022-08-10 DIAGNOSIS — E119 Type 2 diabetes mellitus without complications: Secondary | ICD-10-CM | POA: Diagnosis not present

## 2022-08-10 DIAGNOSIS — J309 Allergic rhinitis, unspecified: Secondary | ICD-10-CM | POA: Diagnosis not present

## 2022-08-10 DIAGNOSIS — D649 Anemia, unspecified: Secondary | ICD-10-CM | POA: Diagnosis not present

## 2022-08-10 DIAGNOSIS — K8689 Other specified diseases of pancreas: Secondary | ICD-10-CM | POA: Diagnosis not present

## 2022-08-10 DIAGNOSIS — Z466 Encounter for fitting and adjustment of urinary device: Secondary | ICD-10-CM | POA: Diagnosis not present

## 2022-08-10 DIAGNOSIS — G35 Multiple sclerosis: Secondary | ICD-10-CM | POA: Diagnosis not present

## 2022-08-12 ENCOUNTER — Telehealth: Payer: Self-pay | Admitting: Family Medicine

## 2022-08-12 NOTE — Telephone Encounter (Signed)
Called number and spoke to Shaz C. she stated stand lift authorization was approved already from 08/01/2022-08/30/2022  Reference #[id number]

## 2022-08-12 NOTE — Telephone Encounter (Signed)
No answer left detailed vm due to [place] stating it was confidential vm.

## 2022-08-12 NOTE — Telephone Encounter (Signed)
Lyn calling from [facility] Member services is calling to request a PA for the pt's stand lift Medical equipment. Please advise [phone]

## 2022-08-12 NOTE — Telephone Encounter (Signed)
Home Health Verbal Orders   Shireen with [facility] needs a call back with Verbal orders for patient.  Callback Number: [phone]  Requesting OT Home Health  Frequency: 1 week 7

## 2022-08-13 ENCOUNTER — Emergency Department
Admission: EM | Admit: 2022-08-13 | Discharge: 2022-08-13 | Disposition: A | Discharge status: Home / Self Care | Payer: 59 | Attending: Student in an Organized Health Care Education/Training Program | Admitting: Student in an Organized Health Care Education/Training Program

## 2022-08-13 ENCOUNTER — Other Ambulatory Visit: Payer: Self-pay

## 2022-08-13 DIAGNOSIS — R69 Illness, unspecified: Secondary | ICD-10-CM | POA: Diagnosis not present

## 2022-08-13 DIAGNOSIS — R300 Dysuria: Secondary | ICD-10-CM | POA: Diagnosis not present

## 2022-08-13 DIAGNOSIS — T839XXA Unspecified complication of genitourinary prosthetic device, implant and graft, initial encounter: Secondary | ICD-10-CM

## 2022-08-13 DIAGNOSIS — Z743 Need for continuous supervision: Secondary | ICD-10-CM | POA: Diagnosis not present

## 2022-08-13 DIAGNOSIS — Y732 Prosthetic and other implants, materials and accessory gastroenterology and urology devices associated with adverse incidents: Secondary | ICD-10-CM | POA: Diagnosis not present

## 2022-08-13 DIAGNOSIS — R6889 Other general symptoms and signs: Secondary | ICD-10-CM | POA: Diagnosis not present

## 2022-08-13 DIAGNOSIS — T83098A Other mechanical complication of other indwelling urethral catheter, initial encounter: Secondary | ICD-10-CM | POA: Diagnosis present

## 2022-08-13 DIAGNOSIS — T83091A Other mechanical complication of indwelling urethral catheter, initial encounter: Secondary | ICD-10-CM | POA: Diagnosis not present

## 2022-08-13 LAB — BASIC METABOLIC PANEL
Anion gap: 7 (ref 5–15)
BUN: 12 mg/dL (ref 8–23)
CO2: 24 mmol/L (ref 22–32)
Calcium: 9 mg/dL (ref 8.9–10.3)
Chloride: 104 mmol/L (ref 98–111)
Creatinine, Ser: 0.52 mg/dL (ref 0.44–1.00)
GFR, Estimated: >60 mL/min (ref >60)
Glucose, Bld: 121 mg/dL — ABNORMAL HIGH (ref 70–99)
Potassium: 3.4 mmol/L — ABNORMAL LOW (ref 3.5–5.1)
Sodium: 135 mmol/L (ref 135–145)

## 2022-08-13 LAB — URINALYSIS, ROUTINE W REFLEX MICROSCOPIC
Bilirubin Urine: NEGATIVE
Glucose, UA: NEGATIVE mg/dL
Hgb urine dipstick: NEGATIVE
Ketones, ur: 5 mg/dL — AB
Nitrite: NEGATIVE
Protein, ur: 100 mg/dL — AB
Specific Gravity, Urine: 1.025 (ref 1.005–1.030)
WBC, UA: >50 WBC/hpf (ref 0–5)
pH: 7 (ref 5.0–8.0)

## 2022-08-13 LAB — CBC WITH DIFFERENTIAL/PLATELET
Abs Immature Granulocytes: 0.02 10*3/uL (ref 0.00–0.07)
Basophils Absolute: 0 10*3/uL (ref 0.0–0.1)
Basophils Relative: 1 %
Eosinophils Absolute: 0.1 10*3/uL (ref 0.0–0.5)
Eosinophils Relative: 2 %
HCT: 41.9 % (ref 36.0–46.0)
Hemoglobin: 13.5 g/dL (ref 12.0–15.0)
Immature Granulocytes: 1 %
Lymphocytes Relative: 38 %
Lymphs Abs: 1.4 10*3/uL (ref 0.7–4.0)
MCH: 30.1 pg (ref 26.0–34.0)
MCHC: 32.2 g/dL (ref 30.0–36.0)
MCV: 93.5 fL (ref 80.0–100.0)
Monocytes Absolute: 0.3 10*3/uL (ref 0.1–1.0)
Monocytes Relative: 7 %
Neutro Abs: 1.8 10*3/uL (ref 1.7–7.7)
Neutrophils Relative %: 51 %
Platelets: 166 10*3/uL (ref 150–400)
RBC: 4.48 MIL/uL (ref 3.87–5.11)
RDW: 12.7 % (ref 11.5–15.5)
WBC: 3.6 10*3/uL — ABNORMAL LOW (ref 4.0–10.5)
nRBC: 0 % (ref 0.0–0.2)

## 2022-08-13 NOTE — Discharge Instructions (Signed)
Your exam and labs are normal and reassuring.  Your UA only showed rare bacteria.  I will defer any treatment of your chronic bacteriuria to your urologist.  Follow-up with primary provider return to ED if necessary.

## 2022-08-13 NOTE — ED Notes (Signed)
Pt cleaned of incontinence. Bed linens changed.  
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
## 2022-08-13 NOTE — ED Notes (Signed)
[facility] arrived to take patient home.

## 2022-08-14 DIAGNOSIS — M6281 Muscle weakness (generalized): Secondary | ICD-10-CM | POA: Diagnosis not present

## 2022-08-14 DIAGNOSIS — R262 Difficulty in walking, not elsewhere classified: Secondary | ICD-10-CM | POA: Diagnosis not present

## 2022-08-14 DIAGNOSIS — L03116 Cellulitis of left lower limb: Secondary | ICD-10-CM | POA: Diagnosis not present

## 2022-08-15 ENCOUNTER — Encounter: Payer: Self-pay | Admitting: Diagnostic Neuroimaging

## 2022-08-15 ENCOUNTER — Telehealth (INDEPENDENT_AMBULATORY_CARE_PROVIDER_SITE_OTHER): Payer: 59 | Admitting: Diagnostic Neuroimaging

## 2022-08-15 DIAGNOSIS — G35 Multiple sclerosis: Secondary | ICD-10-CM

## 2022-08-15 DIAGNOSIS — G40909 Epilepsy, unspecified, not intractable, without status epilepticus: Secondary | ICD-10-CM

## 2022-08-15 DIAGNOSIS — G35D Multiple sclerosis, unspecified: Secondary | ICD-10-CM

## 2022-08-15 DIAGNOSIS — Z760 Encounter for issue of repeat prescription: Secondary | ICD-10-CM | POA: Diagnosis not present

## 2022-08-15 DIAGNOSIS — G8220 Paraplegia, unspecified: Secondary | ICD-10-CM

## 2022-08-15 DIAGNOSIS — IMO0002 Reserved for concepts with insufficient information to code with codable children: Secondary | ICD-10-CM

## 2022-08-15 LAB — URINE CULTURE

## 2022-08-15 MED ORDER — TIZANIDINE HCL 4 MG PO TABS: 4.0000 mg | ORAL_TABLET | Freq: Every day | ORAL | 4 refills | Status: DC

## 2022-08-15 MED ORDER — LEVETIRACETAM 500 MG PO TABS
500.0000 mg | ORAL_TABLET | Freq: Two times a day (BID) | ORAL | 4 refills | Status: DC
Start: 2022-08-15 — End: 2023-08-30

## 2022-08-15 MED ORDER — REBIF REBIDOSE 44 MCG/0.5ML ~~LOC~~ SOAJ: SUBCUTANEOUS | 0 refills | Status: DC

## 2022-08-15 MED ORDER — BACLOFEN 10 MG PO TABS
10.0000 mg | ORAL_TABLET | Freq: Three times a day (TID) | ORAL | 12 refills | Status: DC
Start: 2022-08-15 — End: 2023-08-31

## 2022-08-15 NOTE — Progress Notes (Signed)
Chief Complaint  Patient presents with   Multiple Sclerosis     History of Present Illness:  UPDATE (08/15/22, VRP): Since last visit, doing about the same. Symptoms are stable. Severity is moderate. No alleviating or aggravating factors. Tolerating meds.    UPDATE (01/04/21, VRP): patient now at rehab facility. Fell at home, and broke her left tibia and fibula (Dec 08, 2020). MS has been stable.   UPDATE (12/16/19 , VRP): Since last visit, doing well; now s/p yeast UTI, on diflucan, and pain in feet reduced. Has seen Dr. Allena Katz. Has home health nurse; urine still has some odor and patient needs follow up with urology. Tolerating rebif.   PRIOR HPI: - tolerating rebif; had Moderna covid vaccine (May and June 2021); overall doing well at home. - continuing with gait issues - tolerating muscle relaxers and amantadine    Observations/Objective:  Video visit   Assessment and Plan:   62 y.o. 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 Last dose tysabri 10/29/10. Switched to [place] on 04/25/11.    Had brief seizure before starting gilenya, now on levetiracetam.    Then with right V2, V3 herpes zoster on 05/27/11. Gilenya stopped. Shingles resolved.  Then on rebif. Also s/p ACDF C5-6 (Oct 2013) for spinal stenosis.    Then with breast CA diagnosis, s/p right mastectomy, s/p adriamycin, cytoxan, taxol. Now treatments have been completed.    Now back on rebif (since Jan 2017).   Now with new onset headaches (June 2017). MRI brain unremarkable. HA now improving.    Dx:  1. Seizure disorder (HCC)   2. Medication refill   3. 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    Dispense:  180 tablet    Refill:  4   baclofen (LIORESAL) 10 MG tablet    Sig: Take 1-2 tablets (10-20 mg total) by mouth 3 (three) times daily.    Dispense:  120 tablet    Refill:  12   tiZANidine (ZANAFLEX) 4 MG tablet    Sig: Take 1 tablet (4 mg total) by mouth at bedtime.    Dispense:  90 tablet    Refill:  4   REBIF REBIDOSE 44 MCG/0.5ML SOAJ    Sig: Inject  44 mcg Sub-q 3 times weekly Rotate site after each injection    Dispense:  12 mL    Refill:  0     Follow Up Instructions:  - Return in about 1 year (around 08/15/2023) for MyChart visit (15 min).     Virtual Visit via Video Note  I connected with Rebecca [name] on 08/15/22 at  3:00 PM EDT by a video enabled telemedicine application and verified that I am speaking with the correct person using two identifiers.   I discussed the limitations  of evaluation and management by telemedicine and the availability of in person appointments. The patient expressed understanding and agreed to proceed.  Patient is at home and I am at the office.   I spent 15 minutes of face-to-face and non-face-to-face time with patient.  This included previsit chart review, lab review, study review, order entry, electronic health record documentation, patient education.      Suanne Marker, MD 08/15/2022, 2:31 PM Certified in Neurology, Neurophysiology and Neuroimaging  [facility] [address], [address] [place], [place] [address] [phone]

## 2022-08-16 DIAGNOSIS — G801 Spastic diplegic cerebral palsy: Secondary | ICD-10-CM | POA: Diagnosis not present

## 2022-08-16 DIAGNOSIS — J309 Allergic rhinitis, unspecified: Secondary | ICD-10-CM | POA: Diagnosis not present

## 2022-08-16 DIAGNOSIS — E119 Type 2 diabetes mellitus without complications: Secondary | ICD-10-CM | POA: Diagnosis not present

## 2022-08-16 DIAGNOSIS — D696 Thrombocytopenia, unspecified: Secondary | ICD-10-CM | POA: Diagnosis not present

## 2022-08-16 DIAGNOSIS — M542 Cervicalgia: Secondary | ICD-10-CM | POA: Diagnosis not present

## 2022-08-16 DIAGNOSIS — G894 Chronic pain syndrome: Secondary | ICD-10-CM | POA: Diagnosis not present

## 2022-08-16 DIAGNOSIS — K222 Esophageal obstruction: Secondary | ICD-10-CM | POA: Diagnosis not present

## 2022-08-16 DIAGNOSIS — H900 Conductive hearing loss, bilateral: Secondary | ICD-10-CM | POA: Diagnosis not present

## 2022-08-16 DIAGNOSIS — K8689 Other specified diseases of pancreas: Secondary | ICD-10-CM | POA: Diagnosis not present

## 2022-08-16 DIAGNOSIS — G35 Multiple sclerosis: Secondary | ICD-10-CM | POA: Diagnosis not present

## 2022-08-16 DIAGNOSIS — Z981 Arthrodesis status: Secondary | ICD-10-CM | POA: Diagnosis not present

## 2022-08-16 DIAGNOSIS — H6123 Impacted cerumen, bilateral: Secondary | ICD-10-CM | POA: Diagnosis not present

## 2022-08-16 DIAGNOSIS — Z466 Encounter for fitting and adjustment of urinary device: Secondary | ICD-10-CM | POA: Diagnosis not present

## 2022-08-16 DIAGNOSIS — D709 Neutropenia, unspecified: Secondary | ICD-10-CM | POA: Diagnosis not present

## 2022-08-16 DIAGNOSIS — D649 Anemia, unspecified: Secondary | ICD-10-CM | POA: Diagnosis not present

## 2022-08-16 DIAGNOSIS — I890 Lymphedema, not elsewhere classified: Secondary | ICD-10-CM | POA: Diagnosis not present

## 2022-08-16 DIAGNOSIS — I10 Essential (primary) hypertension: Secondary | ICD-10-CM | POA: Diagnosis not present

## 2022-08-16 DIAGNOSIS — Z853 Personal history of malignant neoplasm of breast: Secondary | ICD-10-CM | POA: Diagnosis not present

## 2022-08-16 DIAGNOSIS — K592 Neurogenic bowel, not elsewhere classified: Secondary | ICD-10-CM | POA: Diagnosis not present

## 2022-08-16 DIAGNOSIS — R1310 Dysphagia, unspecified: Secondary | ICD-10-CM | POA: Diagnosis not present

## 2022-08-16 DIAGNOSIS — K589 Irritable bowel syndrome without diarrhea: Secondary | ICD-10-CM | POA: Diagnosis not present

## 2022-08-16 DIAGNOSIS — K219 Gastro-esophageal reflux disease without esophagitis: Secondary | ICD-10-CM | POA: Diagnosis not present

## 2022-08-16 DIAGNOSIS — N319 Neuromuscular dysfunction of bladder, unspecified: Secondary | ICD-10-CM | POA: Diagnosis not present

## 2022-08-16 LAB — URINE CULTURE
Culture: >=100000 — AB
Special Requests: NORMAL

## 2022-08-17 DIAGNOSIS — K592 Neurogenic bowel, not elsewhere classified: Secondary | ICD-10-CM | POA: Diagnosis not present

## 2022-08-17 DIAGNOSIS — D696 Thrombocytopenia, unspecified: Secondary | ICD-10-CM | POA: Diagnosis not present

## 2022-08-17 DIAGNOSIS — Z853 Personal history of malignant neoplasm of breast: Secondary | ICD-10-CM | POA: Diagnosis not present

## 2022-08-17 DIAGNOSIS — G801 Spastic diplegic cerebral palsy: Secondary | ICD-10-CM | POA: Diagnosis not present

## 2022-08-17 DIAGNOSIS — Z466 Encounter for fitting and adjustment of urinary device: Secondary | ICD-10-CM | POA: Diagnosis not present

## 2022-08-17 DIAGNOSIS — E119 Type 2 diabetes mellitus without complications: Secondary | ICD-10-CM | POA: Diagnosis not present

## 2022-08-17 DIAGNOSIS — R1310 Dysphagia, unspecified: Secondary | ICD-10-CM | POA: Diagnosis not present

## 2022-08-17 DIAGNOSIS — Z981 Arthrodesis status: Secondary | ICD-10-CM | POA: Diagnosis not present

## 2022-08-17 DIAGNOSIS — I10 Essential (primary) hypertension: Secondary | ICD-10-CM | POA: Diagnosis not present

## 2022-08-17 DIAGNOSIS — I890 Lymphedema, not elsewhere classified: Secondary | ICD-10-CM | POA: Diagnosis not present

## 2022-08-17 DIAGNOSIS — D649 Anemia, unspecified: Secondary | ICD-10-CM | POA: Diagnosis not present

## 2022-08-17 DIAGNOSIS — K219 Gastro-esophageal reflux disease without esophagitis: Secondary | ICD-10-CM | POA: Diagnosis not present

## 2022-08-17 DIAGNOSIS — G894 Chronic pain syndrome: Secondary | ICD-10-CM | POA: Diagnosis not present

## 2022-08-17 DIAGNOSIS — K589 Irritable bowel syndrome without diarrhea: Secondary | ICD-10-CM | POA: Diagnosis not present

## 2022-08-17 DIAGNOSIS — M542 Cervicalgia: Secondary | ICD-10-CM | POA: Diagnosis not present

## 2022-08-17 DIAGNOSIS — N319 Neuromuscular dysfunction of bladder, unspecified: Secondary | ICD-10-CM | POA: Diagnosis not present

## 2022-08-17 DIAGNOSIS — D709 Neutropenia, unspecified: Secondary | ICD-10-CM | POA: Diagnosis not present

## 2022-08-17 DIAGNOSIS — H900 Conductive hearing loss, bilateral: Secondary | ICD-10-CM | POA: Diagnosis not present

## 2022-08-17 DIAGNOSIS — H6123 Impacted cerumen, bilateral: Secondary | ICD-10-CM | POA: Diagnosis not present

## 2022-08-17 DIAGNOSIS — K8689 Other specified diseases of pancreas: Secondary | ICD-10-CM | POA: Diagnosis not present

## 2022-08-17 DIAGNOSIS — G35 Multiple sclerosis: Secondary | ICD-10-CM | POA: Diagnosis not present

## 2022-08-17 DIAGNOSIS — K222 Esophageal obstruction: Secondary | ICD-10-CM | POA: Diagnosis not present

## 2022-08-17 DIAGNOSIS — J309 Allergic rhinitis, unspecified: Secondary | ICD-10-CM | POA: Diagnosis not present

## 2022-08-18 ENCOUNTER — Telehealth: Payer: Self-pay

## 2022-08-18 ENCOUNTER — Inpatient Hospital Stay: Payer: 59

## 2022-08-18 DIAGNOSIS — I890 Lymphedema, not elsewhere classified: Secondary | ICD-10-CM | POA: Diagnosis not present

## 2022-08-18 DIAGNOSIS — D649 Anemia, unspecified: Secondary | ICD-10-CM | POA: Diagnosis not present

## 2022-08-18 DIAGNOSIS — M542 Cervicalgia: Secondary | ICD-10-CM | POA: Diagnosis not present

## 2022-08-18 DIAGNOSIS — D696 Thrombocytopenia, unspecified: Secondary | ICD-10-CM | POA: Diagnosis not present

## 2022-08-18 DIAGNOSIS — G894 Chronic pain syndrome: Secondary | ICD-10-CM | POA: Diagnosis not present

## 2022-08-18 DIAGNOSIS — H6123 Impacted cerumen, bilateral: Secondary | ICD-10-CM | POA: Diagnosis not present

## 2022-08-18 DIAGNOSIS — E119 Type 2 diabetes mellitus without complications: Secondary | ICD-10-CM | POA: Diagnosis not present

## 2022-08-18 DIAGNOSIS — K8689 Other specified diseases of pancreas: Secondary | ICD-10-CM | POA: Diagnosis not present

## 2022-08-18 DIAGNOSIS — J309 Allergic rhinitis, unspecified: Secondary | ICD-10-CM | POA: Diagnosis not present

## 2022-08-18 DIAGNOSIS — G35 Multiple sclerosis: Secondary | ICD-10-CM | POA: Diagnosis not present

## 2022-08-18 DIAGNOSIS — R1310 Dysphagia, unspecified: Secondary | ICD-10-CM | POA: Diagnosis not present

## 2022-08-18 DIAGNOSIS — K589 Irritable bowel syndrome without diarrhea: Secondary | ICD-10-CM | POA: Diagnosis not present

## 2022-08-18 DIAGNOSIS — Z853 Personal history of malignant neoplasm of breast: Secondary | ICD-10-CM | POA: Diagnosis not present

## 2022-08-18 DIAGNOSIS — N319 Neuromuscular dysfunction of bladder, unspecified: Secondary | ICD-10-CM | POA: Diagnosis not present

## 2022-08-18 DIAGNOSIS — Z981 Arthrodesis status: Secondary | ICD-10-CM | POA: Diagnosis not present

## 2022-08-18 DIAGNOSIS — K592 Neurogenic bowel, not elsewhere classified: Secondary | ICD-10-CM | POA: Diagnosis not present

## 2022-08-18 DIAGNOSIS — K219 Gastro-esophageal reflux disease without esophagitis: Secondary | ICD-10-CM | POA: Diagnosis not present

## 2022-08-18 DIAGNOSIS — H900 Conductive hearing loss, bilateral: Secondary | ICD-10-CM | POA: Diagnosis not present

## 2022-08-18 DIAGNOSIS — D709 Neutropenia, unspecified: Secondary | ICD-10-CM | POA: Diagnosis not present

## 2022-08-18 DIAGNOSIS — Z466 Encounter for fitting and adjustment of urinary device: Secondary | ICD-10-CM | POA: Diagnosis not present

## 2022-08-18 DIAGNOSIS — K222 Esophageal obstruction: Secondary | ICD-10-CM | POA: Diagnosis not present

## 2022-08-18 DIAGNOSIS — I10 Essential (primary) hypertension: Secondary | ICD-10-CM | POA: Diagnosis not present

## 2022-08-18 DIAGNOSIS — G801 Spastic diplegic cerebral palsy: Secondary | ICD-10-CM | POA: Diagnosis not present

## 2022-08-18 NOTE — Telephone Encounter (Signed)
Transition Care Management Follow-up Telephone Call Date of discharge and from where: 08/13/2022 [facility] How have you been since you were released from the hospital? Patient is feeling better. Any questions or concerns? No  Items Reviewed: Did the pt receive and understand the discharge instructions provided? Yes  Medications obtained and verified?  No medication prescribed. Other? No  Any new allergies since your discharge? No  Dietary orders reviewed? Yes Do you have support at home? Yes   Follow up appointments reviewed:  PCP Hospital f/u appt confirmed? No  Scheduled to see  on  @ . Specialist Hospital f/u appt confirmed? Yes  Scheduled to see Celso Amy, PA-C on 08/25/2022 @ [facility]. Are transportation arrangements needed? No  If their condition worsens, is the pt aware to call PCP or go to the Emergency Dept.? Yes Was the patient provided with contact information for the PCP's office or ED? Yes Was to pt encouraged to call back with questions or concerns? Yes  Ellary Casamento [facility]  [facility] Population Health Community Resource Care Guide   ??[email or web address]  ?? [phone]   Website: [email or web address]  [email or web address]

## 2022-08-22 ENCOUNTER — Telehealth: Payer: Self-pay

## 2022-08-22 NOTE — Telephone Encounter (Signed)
Patient left a voicemail and wants to tell us some information before her appointment on 08/25/2022. Return patient call and she states that she has MS and she will be coming by ambulance on a stretcher. She states she wanted to let us know before she showed up

## 2022-08-23 DIAGNOSIS — K589 Irritable bowel syndrome without diarrhea: Secondary | ICD-10-CM | POA: Diagnosis not present

## 2022-08-23 DIAGNOSIS — D696 Thrombocytopenia, unspecified: Secondary | ICD-10-CM | POA: Diagnosis not present

## 2022-08-23 DIAGNOSIS — Z466 Encounter for fitting and adjustment of urinary device: Secondary | ICD-10-CM | POA: Diagnosis not present

## 2022-08-23 DIAGNOSIS — Z981 Arthrodesis status: Secondary | ICD-10-CM | POA: Diagnosis not present

## 2022-08-23 DIAGNOSIS — K219 Gastro-esophageal reflux disease without esophagitis: Secondary | ICD-10-CM | POA: Diagnosis not present

## 2022-08-23 DIAGNOSIS — D649 Anemia, unspecified: Secondary | ICD-10-CM | POA: Diagnosis not present

## 2022-08-23 DIAGNOSIS — G894 Chronic pain syndrome: Secondary | ICD-10-CM | POA: Diagnosis not present

## 2022-08-23 DIAGNOSIS — H900 Conductive hearing loss, bilateral: Secondary | ICD-10-CM | POA: Diagnosis not present

## 2022-08-23 DIAGNOSIS — E119 Type 2 diabetes mellitus without complications: Secondary | ICD-10-CM | POA: Diagnosis not present

## 2022-08-23 DIAGNOSIS — H6123 Impacted cerumen, bilateral: Secondary | ICD-10-CM | POA: Diagnosis not present

## 2022-08-23 DIAGNOSIS — I10 Essential (primary) hypertension: Secondary | ICD-10-CM | POA: Diagnosis not present

## 2022-08-23 DIAGNOSIS — K8689 Other specified diseases of pancreas: Secondary | ICD-10-CM | POA: Diagnosis not present

## 2022-08-23 DIAGNOSIS — N319 Neuromuscular dysfunction of bladder, unspecified: Secondary | ICD-10-CM | POA: Diagnosis not present

## 2022-08-23 DIAGNOSIS — G801 Spastic diplegic cerebral palsy: Secondary | ICD-10-CM | POA: Diagnosis not present

## 2022-08-23 DIAGNOSIS — D709 Neutropenia, unspecified: Secondary | ICD-10-CM | POA: Diagnosis not present

## 2022-08-23 DIAGNOSIS — K592 Neurogenic bowel, not elsewhere classified: Secondary | ICD-10-CM | POA: Diagnosis not present

## 2022-08-23 DIAGNOSIS — I890 Lymphedema, not elsewhere classified: Secondary | ICD-10-CM | POA: Diagnosis not present

## 2022-08-23 DIAGNOSIS — K222 Esophageal obstruction: Secondary | ICD-10-CM | POA: Diagnosis not present

## 2022-08-23 DIAGNOSIS — J309 Allergic rhinitis, unspecified: Secondary | ICD-10-CM | POA: Diagnosis not present

## 2022-08-23 DIAGNOSIS — Z853 Personal history of malignant neoplasm of breast: Secondary | ICD-10-CM | POA: Diagnosis not present

## 2022-08-23 DIAGNOSIS — G35 Multiple sclerosis: Secondary | ICD-10-CM | POA: Diagnosis not present

## 2022-08-23 DIAGNOSIS — M542 Cervicalgia: Secondary | ICD-10-CM | POA: Diagnosis not present

## 2022-08-23 DIAGNOSIS — R1310 Dysphagia, unspecified: Secondary | ICD-10-CM | POA: Diagnosis not present

## 2022-08-24 DIAGNOSIS — Z466 Encounter for fitting and adjustment of urinary device: Secondary | ICD-10-CM | POA: Diagnosis not present

## 2022-08-24 DIAGNOSIS — H6123 Impacted cerumen, bilateral: Secondary | ICD-10-CM | POA: Diagnosis not present

## 2022-08-24 DIAGNOSIS — I890 Lymphedema, not elsewhere classified: Secondary | ICD-10-CM | POA: Diagnosis not present

## 2022-08-24 DIAGNOSIS — D649 Anemia, unspecified: Secondary | ICD-10-CM | POA: Diagnosis not present

## 2022-08-24 DIAGNOSIS — D696 Thrombocytopenia, unspecified: Secondary | ICD-10-CM | POA: Diagnosis not present

## 2022-08-24 DIAGNOSIS — N319 Neuromuscular dysfunction of bladder, unspecified: Secondary | ICD-10-CM | POA: Diagnosis not present

## 2022-08-24 DIAGNOSIS — G894 Chronic pain syndrome: Secondary | ICD-10-CM | POA: Diagnosis not present

## 2022-08-24 DIAGNOSIS — D709 Neutropenia, unspecified: Secondary | ICD-10-CM | POA: Diagnosis not present

## 2022-08-24 DIAGNOSIS — M542 Cervicalgia: Secondary | ICD-10-CM | POA: Diagnosis not present

## 2022-08-24 DIAGNOSIS — J309 Allergic rhinitis, unspecified: Secondary | ICD-10-CM | POA: Diagnosis not present

## 2022-08-24 DIAGNOSIS — K219 Gastro-esophageal reflux disease without esophagitis: Secondary | ICD-10-CM | POA: Diagnosis not present

## 2022-08-24 DIAGNOSIS — E119 Type 2 diabetes mellitus without complications: Secondary | ICD-10-CM | POA: Diagnosis not present

## 2022-08-24 DIAGNOSIS — Z981 Arthrodesis status: Secondary | ICD-10-CM | POA: Diagnosis not present

## 2022-08-24 DIAGNOSIS — K592 Neurogenic bowel, not elsewhere classified: Secondary | ICD-10-CM | POA: Diagnosis not present

## 2022-08-24 DIAGNOSIS — H900 Conductive hearing loss, bilateral: Secondary | ICD-10-CM | POA: Diagnosis not present

## 2022-08-24 DIAGNOSIS — K222 Esophageal obstruction: Secondary | ICD-10-CM | POA: Diagnosis not present

## 2022-08-24 DIAGNOSIS — K8689 Other specified diseases of pancreas: Secondary | ICD-10-CM | POA: Diagnosis not present

## 2022-08-24 DIAGNOSIS — I10 Essential (primary) hypertension: Secondary | ICD-10-CM | POA: Diagnosis not present

## 2022-08-24 DIAGNOSIS — Z853 Personal history of malignant neoplasm of breast: Secondary | ICD-10-CM | POA: Diagnosis not present

## 2022-08-24 DIAGNOSIS — K589 Irritable bowel syndrome without diarrhea: Secondary | ICD-10-CM | POA: Diagnosis not present

## 2022-08-24 DIAGNOSIS — G801 Spastic diplegic cerebral palsy: Secondary | ICD-10-CM | POA: Diagnosis not present

## 2022-08-24 DIAGNOSIS — R1310 Dysphagia, unspecified: Secondary | ICD-10-CM | POA: Diagnosis not present

## 2022-08-24 DIAGNOSIS — G35 Multiple sclerosis: Secondary | ICD-10-CM | POA: Diagnosis not present

## 2022-08-24 NOTE — Progress Notes (Signed)
Rebecca Amy, PA-C [address]  [address]  [place], [place] [address]  Main: [phone]  Fax: [phone]   Primary Care Physician: Danelle Berry, PA-C  Primary Gastroenterologist:  Rebecca Amy, PA-C / Dr. Wyline Mood    CC: F/U Chronic Diarrhea; EPI  HPI: Rebecca [name] is a 62 y.o. female with history of MS, previous patient of Dr. Maximino Greenland, returns for follow-up of pancreatic insufficiency.  Has been on Creon 36,000 lipase units with benefit.  She takes 2 with each meal and 1 with each snack.  Does control her diarrhea.  She ran out of Creon a few weeks ago and has had increased episodes of diarrhea with fecal incontinence.  She needs refill of Creon.  She last saw Dr. Maximino Greenland in 2022 to follow-up dysphagia and diarrhea.  Underwent EGD with dilation and her dysphagia resolved.  Diarrhea improved on Creon.  RUQ ultrasound 12/2020.  (To evaluate elevated LFTs) showed previous cholecystectomy, hepatic steatosis, bile duct 1.1 cm (within normal limits s/p cholecystectomy).  Labs 08/13/2022 showed normal hemoglobin 13.5g.  Normal renal function. Lab 06/2022 showed normal LFTs except slightly elevated ALT 31.  Greatly improved from previous labs.  She broke her leg over a year ago and has had a very difficult recovery.  Was in a nursing facility for over a year.  Recently moved home.  She is here today on a stretcher, unable to ambulate.  Physical therapy is working with her to try to get her into a wheelchair again.  Last colonoscopy was normal around 2012.  She is overdue for a 10-year repeat screening colonoscopy.  She denies rectal bleeding or family history of colon cancer.  She is not physically able to complete colonoscopy prep at this time.   Current Outpatient Medications  Medication Sig Dispense Refill   amantadine (SYMMETREL) 100 MG capsule TAKE 1 CAPSULE BY MOUTH THREE TIMES A DAY 180 capsule 0   baclofen (LIORESAL) 10 MG tablet Take 1-2 tablets (10-20 mg total) by  mouth 3 (three) times daily. 120 tablet 12   Disposable Gloves (ASSURANCE VINYL EXAM GLOVES) MISC Neurogenic bladder; MS; LON 99 months; for use for personal hygiene 50 each 11   fluconazole (DIFLUCAN) 150 MG tablet Take 150 mg by mouth once.     gluconic acid-citric acid (RENACIDIN) irrigation Insert 30 mL into suprapubic tube and clamp tube for 10 minutes and then drain twice daily 500 mL 1   Glucosamine-Chondroitin (COSAMIN DS PO) Take 1 tablet by mouth 5 (five) times daily.     ibuprofen (ADVIL) 600 MG tablet TAKE 1 TABLET BY MOUTH EVERY 8 HOURS AS NEEDED. (Patient taking differently: Take 600 mg by mouth every 8 (eight) hours as needed for moderate pain, mild pain or headache.) 90 tablet 3   levETIRAcetam (KEPPRA) 500 MG tablet Take 1 tablet (500 mg total) by mouth 2 (two) times daily. 180 tablet 4   lidocaine-prilocaine (EMLA) cream Apply 1 application topically as needed. 30 g 0   lipase/protease/amylase (CREON) 36000 UNITS CPEP capsule TAKE 1 CAPSULE BY MOUTH WITH BREAKFAST, WITH LUNCH, AND WITH EVENING MEAL AND 1 CAPSULE WITH SNACKS. (Patient taking differently: Take 36,000 Units by mouth 3 (three) times daily with meals.) 540 capsule 2   loratadine (CLARITIN) 10 MG tablet Take 10 mg by mouth daily.     Misc Natural Products (LEG VEIN & CIRCULATION) TABS Take 1 tablet by mouth 2 (two) times daily.      Multiple Vitamins-Minerals (HAIR SKIN AND  NAILS FORMULA) TABS Take 1 tablet by mouth 2 (two) times daily.      Multiple Vitamins-Minerals (MULTIVITAMIN PO) Take 1 tablet by mouth daily.      nystatin (MYCOSTATIN/NYSTOP) powder Apply 1 application topically 3 (three) times daily as needed. For rash/raw skin as needed 15 g 2   omeprazole (PRILOSEC) 40 MG capsule Take 1 capsule (40 mg total) by mouth daily. 90 capsule 1   oxybutynin (DITROPAN-XL) 10 MG 24 hr tablet TAKE 1 TABLET BY MOUTH TWICE A DAY 120 tablet 0   REBIF REBIDOSE 44 MCG/0.5ML SOAJ Inject  44 mcg Sub-q 3 times weekly Rotate site  after each injection 12 mL 0   tiZANidine (ZANAFLEX) 4 MG tablet Take 1 tablet (4 mg total) by mouth at bedtime. 90 tablet 4   Turmeric 500 MG CAPS Take 500 mg by mouth daily.     No current facility-administered medications for this visit.    Allergies as of 08/25/2022 - Review Complete 08/15/2022  Allergen Reaction Noted   Fentanyl Nausea And Vomiting and Nausea Only 02/04/2014   Sulfa antibiotics Hives and Other (See Comments) 06/08/2011    Past Medical History:  Diagnosis Date   Allergy    Aortic valve disease    Mild AS / AI - most recent Echo demonstrated tricuspid aortic valve.   Bacterial endocarditis    History of .   Bilateral lower extremity edema    Noncardiac.  Chronic. LE Venous dopplers - negative for DVT.; Echocardiogram January 2016: Normal EF with normal wall motion and valve function. Only grade 1 diastolic dysfunction. EF 60-65%. Mild MR   Breast cancer (HCC) 12/31/2013   Right breast, 12:00, 1.5 cm, T1c,N0 invasive mammary carcinoma, triple negative. --> Rx with Chemo   Cervical stenosis of spine    GERD (gastroesophageal reflux disease) April or May 2022   Heart murmur 2013   Herpes zoster    IBS (irritable bowel syndrome)    Lymphedema    has legs wrapped at [facility]   Multiple sclerosis (HCC) 2001   Walks from room to room @ home; but Wheelchair when going out.   Neuromuscular disorder (HCC)    MS   PONV (postoperative nausea and vomiting)    Related to Fentanyl   Seizures (HCC)    Takes Keppra   Syncope and collapse     Past Surgical History:  Procedure Laterality Date   ANKLE SURGERY     Left   ANKLE SURGERY     ANTERIOR CERVICAL DECOMP/DISCECTOMY FUSION  11/17/2011   Procedure: ANTERIOR CERVICAL DECOMPRESSION/DISCECTOMY FUSION 2 LEVELS;  Surgeon: Maeola Harman, MD;  Location: [facility];  Service: Neurosurgery;  Laterality: N/A;  Cervical Five-Six Six-Seven Anterior cervical decompression/diskectomy/fusion   BREAST BIOPSY Right 12/31/2013    invasive mammary   BREAST SURGERY Right 02/03/2014   Right simple mastectomy with sentinel node biopsy.   CHOLECYSTECTOMY     COLONOSCOPY  2014   ESOPHAGOGASTRODUODENOSCOPY (EGD) WITH PROPOFOL N/A 07/16/2020   Procedure: ESOPHAGOGASTRODUODENOSCOPY (EGD) WITH PROPOFOL;  Surgeon: Pasty Spillers, MD;  Location: [facility] ENDOSCOPY;  Service: Endoscopy;  Laterality: N/A;  Patient has MS and will need assistance   HYSTEROSCOPY WITH D & C N/A 11/27/2018   Procedure: DILATATION AND CURETTAGE /HYSTEROSCOPY;  Surgeon: Nadara Mustard, MD;  Location: [facility] ORS;  Service: Gynecology;  Laterality: N/A;   Lower extremity venous Dopplers  02/27/2013   No LE DVT   MASTECTOMY Right 2015   ORIF TIBIA PLATEAU Left 12/09/2020  Procedure: LEFT OPEN REDUCTION INTERNAL FIXATION (ORIF) TIBIAL PLATEAU;  Surgeon: Roby Lofts, MD;  Location: [facility] OR;  Service: Orthopedics;  Laterality: Left;   Port a cath insertion Right 01/19/2010   PORT-A-CATH REMOVAL     right   PORT-A-CATH REMOVAL Right 09/03/2013   Procedure: REMOVAL PORT-A-CATH;  Surgeon: Fransisco Hertz, MD;  Location: [facility] OR;  Service: Vascular;  Laterality: Right;   SPINE SURGERY  Cervical steinois Dr. Venetia Maxon 2014 I think?   TONSILLECTOMY     TRANSTHORACIC ECHOCARDIOGRAM  03/2013; 02/2014   a) Normal LV size and function with EF 60-65%.; Cannot exclude bicuspid aortic valve with mild AS and mild AI.; b) Normal EF with normal wall motion and valve function x Mild MR. G2 DD. EF 60-65%. Tricuspid AoV   UPPER GI ENDOSCOPY  2014    Review of Systems:    All systems reviewed and negative except where noted in HPI.   Physical Examination:   There were no vitals taken for this visit.  General: Chronically ill-appearing; in no acute distress; lying on a stretcher.  Eyes: No icterus. Conjunctivae pink. Neuro: Alert and oriented x 3.  Unable to ambulate. Skin: Warm and dry, no jaundice.   Psych: Alert and cooperative, normal mood and affect.   Imaging  Studies: No results found.  Assessment and Plan:   KISHARA LOVELAND is a 62 y.o. y/o female returns for follow-up of:  Exocrine pancreatic insufficiency; diarrhea has been well-controlled on Creon 36,000 lipase units  Refill Creon 36,000 lipase units, take 2 capsules with each meal and 1 with each snack.  Colon cancer screening Colon cancer screening guidelines and options were discussed. She declined traditional colonoscopy given her current health status.  Unable to complete colonoscopy prep. She agrees to do Cologuard test.  3.  Multiple Sclerosis; Not ambulatory; Worsening in the past 2 years.   Rebecca Amy, PA-C  Follow up in 1 year.

## 2022-08-25 ENCOUNTER — Encounter: Payer: Self-pay | Admitting: Physician Assistant

## 2022-08-25 ENCOUNTER — Telehealth: Payer: Self-pay

## 2022-08-25 ENCOUNTER — Ambulatory Visit (INDEPENDENT_AMBULATORY_CARE_PROVIDER_SITE_OTHER): Payer: 59 | Admitting: Physician Assistant

## 2022-08-25 VITALS — BP 147/84 | HR 68 | Temp 98.1°F | Ht 63.0 in | Wt 223.0 lb

## 2022-08-25 DIAGNOSIS — I10 Essential (primary) hypertension: Secondary | ICD-10-CM | POA: Diagnosis not present

## 2022-08-25 DIAGNOSIS — Z1211 Encounter for screening for malignant neoplasm of colon: Secondary | ICD-10-CM

## 2022-08-25 DIAGNOSIS — J309 Allergic rhinitis, unspecified: Secondary | ICD-10-CM | POA: Diagnosis not present

## 2022-08-25 DIAGNOSIS — R1310 Dysphagia, unspecified: Secondary | ICD-10-CM | POA: Diagnosis not present

## 2022-08-25 DIAGNOSIS — D696 Thrombocytopenia, unspecified: Secondary | ICD-10-CM | POA: Diagnosis not present

## 2022-08-25 DIAGNOSIS — R279 Unspecified lack of coordination: Secondary | ICD-10-CM | POA: Diagnosis not present

## 2022-08-25 DIAGNOSIS — I890 Lymphedema, not elsewhere classified: Secondary | ICD-10-CM | POA: Diagnosis not present

## 2022-08-25 DIAGNOSIS — K222 Esophageal obstruction: Secondary | ICD-10-CM | POA: Diagnosis not present

## 2022-08-25 DIAGNOSIS — E119 Type 2 diabetes mellitus without complications: Secondary | ICD-10-CM | POA: Diagnosis not present

## 2022-08-25 DIAGNOSIS — Z981 Arthrodesis status: Secondary | ICD-10-CM | POA: Diagnosis not present

## 2022-08-25 DIAGNOSIS — Z466 Encounter for fitting and adjustment of urinary device: Secondary | ICD-10-CM | POA: Diagnosis not present

## 2022-08-25 DIAGNOSIS — M542 Cervicalgia: Secondary | ICD-10-CM | POA: Diagnosis not present

## 2022-08-25 DIAGNOSIS — K8689 Other specified diseases of pancreas: Secondary | ICD-10-CM | POA: Diagnosis not present

## 2022-08-25 DIAGNOSIS — G894 Chronic pain syndrome: Secondary | ICD-10-CM | POA: Diagnosis not present

## 2022-08-25 DIAGNOSIS — Z853 Personal history of malignant neoplasm of breast: Secondary | ICD-10-CM | POA: Diagnosis not present

## 2022-08-25 DIAGNOSIS — D709 Neutropenia, unspecified: Secondary | ICD-10-CM | POA: Diagnosis not present

## 2022-08-25 DIAGNOSIS — H900 Conductive hearing loss, bilateral: Secondary | ICD-10-CM | POA: Diagnosis not present

## 2022-08-25 DIAGNOSIS — K589 Irritable bowel syndrome without diarrhea: Secondary | ICD-10-CM | POA: Diagnosis not present

## 2022-08-25 DIAGNOSIS — N319 Neuromuscular dysfunction of bladder, unspecified: Secondary | ICD-10-CM | POA: Diagnosis not present

## 2022-08-25 DIAGNOSIS — G35 Multiple sclerosis: Secondary | ICD-10-CM | POA: Diagnosis not present

## 2022-08-25 DIAGNOSIS — D649 Anemia, unspecified: Secondary | ICD-10-CM | POA: Diagnosis not present

## 2022-08-25 DIAGNOSIS — K219 Gastro-esophageal reflux disease without esophagitis: Secondary | ICD-10-CM | POA: Diagnosis not present

## 2022-08-25 DIAGNOSIS — Z743 Need for continuous supervision: Secondary | ICD-10-CM | POA: Diagnosis not present

## 2022-08-25 DIAGNOSIS — K592 Neurogenic bowel, not elsewhere classified: Secondary | ICD-10-CM | POA: Diagnosis not present

## 2022-08-25 DIAGNOSIS — H6123 Impacted cerumen, bilateral: Secondary | ICD-10-CM | POA: Diagnosis not present

## 2022-08-25 DIAGNOSIS — G801 Spastic diplegic cerebral palsy: Secondary | ICD-10-CM | POA: Diagnosis not present

## 2022-08-25 MED ORDER — PANCRELIPASE (LIP-PROT-AMYL) 36000-114000 UNITS PO CPEP: ORAL_CAPSULE | ORAL | 11 refills | Status: DC

## 2022-08-25 NOTE — Telephone Encounter (Signed)
Cologuard requisition order faxed to [phone]. Demographics and insurance card faxed as well.

## 2022-08-30 ENCOUNTER — Ambulatory Visit: Payer: 59 | Admitting: Urology

## 2022-08-30 DIAGNOSIS — H6123 Impacted cerumen, bilateral: Secondary | ICD-10-CM | POA: Diagnosis not present

## 2022-08-30 DIAGNOSIS — R1310 Dysphagia, unspecified: Secondary | ICD-10-CM | POA: Diagnosis not present

## 2022-08-30 DIAGNOSIS — M542 Cervicalgia: Secondary | ICD-10-CM | POA: Diagnosis not present

## 2022-08-30 DIAGNOSIS — D709 Neutropenia, unspecified: Secondary | ICD-10-CM | POA: Diagnosis not present

## 2022-08-30 DIAGNOSIS — G801 Spastic diplegic cerebral palsy: Secondary | ICD-10-CM | POA: Diagnosis not present

## 2022-08-30 DIAGNOSIS — H900 Conductive hearing loss, bilateral: Secondary | ICD-10-CM | POA: Diagnosis not present

## 2022-08-30 DIAGNOSIS — K8689 Other specified diseases of pancreas: Secondary | ICD-10-CM | POA: Diagnosis not present

## 2022-08-30 DIAGNOSIS — G35 Multiple sclerosis: Secondary | ICD-10-CM | POA: Diagnosis not present

## 2022-08-30 DIAGNOSIS — K222 Esophageal obstruction: Secondary | ICD-10-CM | POA: Diagnosis not present

## 2022-08-30 DIAGNOSIS — K592 Neurogenic bowel, not elsewhere classified: Secondary | ICD-10-CM | POA: Diagnosis not present

## 2022-08-30 DIAGNOSIS — I890 Lymphedema, not elsewhere classified: Secondary | ICD-10-CM | POA: Diagnosis not present

## 2022-08-30 DIAGNOSIS — K219 Gastro-esophageal reflux disease without esophagitis: Secondary | ICD-10-CM | POA: Diagnosis not present

## 2022-08-30 DIAGNOSIS — K589 Irritable bowel syndrome without diarrhea: Secondary | ICD-10-CM | POA: Diagnosis not present

## 2022-08-30 DIAGNOSIS — Z853 Personal history of malignant neoplasm of breast: Secondary | ICD-10-CM | POA: Diagnosis not present

## 2022-08-30 DIAGNOSIS — Z466 Encounter for fitting and adjustment of urinary device: Secondary | ICD-10-CM | POA: Diagnosis not present

## 2022-08-30 DIAGNOSIS — D649 Anemia, unspecified: Secondary | ICD-10-CM | POA: Diagnosis not present

## 2022-08-30 DIAGNOSIS — G894 Chronic pain syndrome: Secondary | ICD-10-CM | POA: Diagnosis not present

## 2022-08-30 DIAGNOSIS — E119 Type 2 diabetes mellitus without complications: Secondary | ICD-10-CM | POA: Diagnosis not present

## 2022-08-30 DIAGNOSIS — D696 Thrombocytopenia, unspecified: Secondary | ICD-10-CM | POA: Diagnosis not present

## 2022-08-30 DIAGNOSIS — J309 Allergic rhinitis, unspecified: Secondary | ICD-10-CM | POA: Diagnosis not present

## 2022-08-30 DIAGNOSIS — N319 Neuromuscular dysfunction of bladder, unspecified: Secondary | ICD-10-CM | POA: Diagnosis not present

## 2022-08-30 DIAGNOSIS — I10 Essential (primary) hypertension: Secondary | ICD-10-CM | POA: Diagnosis not present

## 2022-08-30 DIAGNOSIS — Z981 Arthrodesis status: Secondary | ICD-10-CM | POA: Diagnosis not present

## 2022-08-31 ENCOUNTER — Other Ambulatory Visit: Payer: Self-pay | Admitting: Neurology

## 2022-08-31 ENCOUNTER — Encounter: Payer: Self-pay | Admitting: Diagnostic Neuroimaging

## 2022-08-31 DIAGNOSIS — H6123 Impacted cerumen, bilateral: Secondary | ICD-10-CM | POA: Diagnosis not present

## 2022-08-31 DIAGNOSIS — K589 Irritable bowel syndrome without diarrhea: Secondary | ICD-10-CM | POA: Diagnosis not present

## 2022-08-31 DIAGNOSIS — I890 Lymphedema, not elsewhere classified: Secondary | ICD-10-CM | POA: Diagnosis not present

## 2022-08-31 DIAGNOSIS — Z760 Encounter for issue of repeat prescription: Secondary | ICD-10-CM

## 2022-08-31 DIAGNOSIS — Z466 Encounter for fitting and adjustment of urinary device: Secondary | ICD-10-CM | POA: Diagnosis not present

## 2022-08-31 DIAGNOSIS — K592 Neurogenic bowel, not elsewhere classified: Secondary | ICD-10-CM | POA: Diagnosis not present

## 2022-08-31 DIAGNOSIS — D709 Neutropenia, unspecified: Secondary | ICD-10-CM | POA: Diagnosis not present

## 2022-08-31 DIAGNOSIS — G35 Multiple sclerosis: Secondary | ICD-10-CM

## 2022-08-31 DIAGNOSIS — Z981 Arthrodesis status: Secondary | ICD-10-CM | POA: Diagnosis not present

## 2022-08-31 DIAGNOSIS — H900 Conductive hearing loss, bilateral: Secondary | ICD-10-CM | POA: Diagnosis not present

## 2022-08-31 DIAGNOSIS — K219 Gastro-esophageal reflux disease without esophagitis: Secondary | ICD-10-CM | POA: Diagnosis not present

## 2022-08-31 DIAGNOSIS — R030 Elevated blood-pressure reading, without diagnosis of hypertension: Secondary | ICD-10-CM

## 2022-08-31 DIAGNOSIS — Z853 Personal history of malignant neoplasm of breast: Secondary | ICD-10-CM | POA: Diagnosis not present

## 2022-08-31 DIAGNOSIS — D696 Thrombocytopenia, unspecified: Secondary | ICD-10-CM | POA: Diagnosis not present

## 2022-08-31 DIAGNOSIS — R1310 Dysphagia, unspecified: Secondary | ICD-10-CM | POA: Diagnosis not present

## 2022-08-31 DIAGNOSIS — N319 Neuromuscular dysfunction of bladder, unspecified: Secondary | ICD-10-CM | POA: Diagnosis not present

## 2022-08-31 DIAGNOSIS — G801 Spastic diplegic cerebral palsy: Secondary | ICD-10-CM | POA: Diagnosis not present

## 2022-08-31 DIAGNOSIS — K8689 Other specified diseases of pancreas: Secondary | ICD-10-CM | POA: Diagnosis not present

## 2022-08-31 DIAGNOSIS — I10 Essential (primary) hypertension: Secondary | ICD-10-CM | POA: Diagnosis not present

## 2022-08-31 DIAGNOSIS — K222 Esophageal obstruction: Secondary | ICD-10-CM | POA: Diagnosis not present

## 2022-08-31 DIAGNOSIS — M542 Cervicalgia: Secondary | ICD-10-CM | POA: Diagnosis not present

## 2022-08-31 DIAGNOSIS — D649 Anemia, unspecified: Secondary | ICD-10-CM | POA: Diagnosis not present

## 2022-08-31 DIAGNOSIS — J309 Allergic rhinitis, unspecified: Secondary | ICD-10-CM | POA: Diagnosis not present

## 2022-08-31 DIAGNOSIS — E119 Type 2 diabetes mellitus without complications: Secondary | ICD-10-CM | POA: Diagnosis not present

## 2022-08-31 DIAGNOSIS — G894 Chronic pain syndrome: Secondary | ICD-10-CM | POA: Diagnosis not present

## 2022-08-31 MED ORDER — AMANTADINE HCL 100 MG PO CAPS
ORAL_CAPSULE | ORAL | 0 refills | Status: DC
Start: 2022-08-31 — End: 2022-10-31

## 2022-09-02 ENCOUNTER — Telehealth: Payer: Self-pay | Admitting: Family Medicine

## 2022-09-02 NOTE — Telephone Encounter (Unsigned)
Copied from CRM [id number]. Topic: General - Other >> Sep 02, 2022  9:58 AM Franchot Heidelberg wrote: Reason for CRM:   FYI from [facility]   Receiving case management services with her [facility] plan  [phone] ext. [phone]

## 2022-09-06 DIAGNOSIS — D696 Thrombocytopenia, unspecified: Secondary | ICD-10-CM | POA: Diagnosis not present

## 2022-09-06 DIAGNOSIS — H6123 Impacted cerumen, bilateral: Secondary | ICD-10-CM | POA: Diagnosis not present

## 2022-09-06 DIAGNOSIS — D709 Neutropenia, unspecified: Secondary | ICD-10-CM | POA: Diagnosis not present

## 2022-09-06 DIAGNOSIS — G801 Spastic diplegic cerebral palsy: Secondary | ICD-10-CM | POA: Diagnosis not present

## 2022-09-06 DIAGNOSIS — D649 Anemia, unspecified: Secondary | ICD-10-CM | POA: Diagnosis not present

## 2022-09-06 DIAGNOSIS — Z466 Encounter for fitting and adjustment of urinary device: Secondary | ICD-10-CM | POA: Diagnosis not present

## 2022-09-06 DIAGNOSIS — K8689 Other specified diseases of pancreas: Secondary | ICD-10-CM | POA: Diagnosis not present

## 2022-09-06 DIAGNOSIS — I890 Lymphedema, not elsewhere classified: Secondary | ICD-10-CM | POA: Diagnosis not present

## 2022-09-06 DIAGNOSIS — N319 Neuromuscular dysfunction of bladder, unspecified: Secondary | ICD-10-CM | POA: Diagnosis not present

## 2022-09-06 DIAGNOSIS — H900 Conductive hearing loss, bilateral: Secondary | ICD-10-CM | POA: Diagnosis not present

## 2022-09-06 DIAGNOSIS — K219 Gastro-esophageal reflux disease without esophagitis: Secondary | ICD-10-CM | POA: Diagnosis not present

## 2022-09-06 DIAGNOSIS — G894 Chronic pain syndrome: Secondary | ICD-10-CM | POA: Diagnosis not present

## 2022-09-06 DIAGNOSIS — J309 Allergic rhinitis, unspecified: Secondary | ICD-10-CM | POA: Diagnosis not present

## 2022-09-06 DIAGNOSIS — K592 Neurogenic bowel, not elsewhere classified: Secondary | ICD-10-CM | POA: Diagnosis not present

## 2022-09-06 DIAGNOSIS — R1310 Dysphagia, unspecified: Secondary | ICD-10-CM | POA: Diagnosis not present

## 2022-09-06 DIAGNOSIS — E119 Type 2 diabetes mellitus without complications: Secondary | ICD-10-CM | POA: Diagnosis not present

## 2022-09-06 DIAGNOSIS — M542 Cervicalgia: Secondary | ICD-10-CM | POA: Diagnosis not present

## 2022-09-06 DIAGNOSIS — K589 Irritable bowel syndrome without diarrhea: Secondary | ICD-10-CM | POA: Diagnosis not present

## 2022-09-06 DIAGNOSIS — G35 Multiple sclerosis: Secondary | ICD-10-CM | POA: Diagnosis not present

## 2022-09-06 DIAGNOSIS — I10 Essential (primary) hypertension: Secondary | ICD-10-CM | POA: Diagnosis not present

## 2022-09-06 DIAGNOSIS — Z981 Arthrodesis status: Secondary | ICD-10-CM | POA: Diagnosis not present

## 2022-09-06 DIAGNOSIS — K222 Esophageal obstruction: Secondary | ICD-10-CM | POA: Diagnosis not present

## 2022-09-06 DIAGNOSIS — Z853 Personal history of malignant neoplasm of breast: Secondary | ICD-10-CM | POA: Diagnosis not present

## 2022-09-07 DIAGNOSIS — G35 Multiple sclerosis: Secondary | ICD-10-CM | POA: Diagnosis not present

## 2022-09-07 DIAGNOSIS — D696 Thrombocytopenia, unspecified: Secondary | ICD-10-CM | POA: Diagnosis not present

## 2022-09-07 DIAGNOSIS — M542 Cervicalgia: Secondary | ICD-10-CM | POA: Diagnosis not present

## 2022-09-07 DIAGNOSIS — H6123 Impacted cerumen, bilateral: Secondary | ICD-10-CM | POA: Diagnosis not present

## 2022-09-07 DIAGNOSIS — D709 Neutropenia, unspecified: Secondary | ICD-10-CM | POA: Diagnosis not present

## 2022-09-07 DIAGNOSIS — N319 Neuromuscular dysfunction of bladder, unspecified: Secondary | ICD-10-CM | POA: Diagnosis not present

## 2022-09-07 DIAGNOSIS — K589 Irritable bowel syndrome without diarrhea: Secondary | ICD-10-CM | POA: Diagnosis not present

## 2022-09-07 DIAGNOSIS — G801 Spastic diplegic cerebral palsy: Secondary | ICD-10-CM | POA: Diagnosis not present

## 2022-09-07 DIAGNOSIS — E119 Type 2 diabetes mellitus without complications: Secondary | ICD-10-CM | POA: Diagnosis not present

## 2022-09-07 DIAGNOSIS — D649 Anemia, unspecified: Secondary | ICD-10-CM | POA: Diagnosis not present

## 2022-09-07 DIAGNOSIS — I890 Lymphedema, not elsewhere classified: Secondary | ICD-10-CM | POA: Diagnosis not present

## 2022-09-07 DIAGNOSIS — Z853 Personal history of malignant neoplasm of breast: Secondary | ICD-10-CM | POA: Diagnosis not present

## 2022-09-07 DIAGNOSIS — K222 Esophageal obstruction: Secondary | ICD-10-CM | POA: Diagnosis not present

## 2022-09-07 DIAGNOSIS — Z466 Encounter for fitting and adjustment of urinary device: Secondary | ICD-10-CM | POA: Diagnosis not present

## 2022-09-07 DIAGNOSIS — I10 Essential (primary) hypertension: Secondary | ICD-10-CM | POA: Diagnosis not present

## 2022-09-07 DIAGNOSIS — Z981 Arthrodesis status: Secondary | ICD-10-CM | POA: Diagnosis not present

## 2022-09-07 DIAGNOSIS — R1310 Dysphagia, unspecified: Secondary | ICD-10-CM | POA: Diagnosis not present

## 2022-09-07 DIAGNOSIS — K219 Gastro-esophageal reflux disease without esophagitis: Secondary | ICD-10-CM | POA: Diagnosis not present

## 2022-09-07 DIAGNOSIS — J309 Allergic rhinitis, unspecified: Secondary | ICD-10-CM | POA: Diagnosis not present

## 2022-09-07 DIAGNOSIS — K592 Neurogenic bowel, not elsewhere classified: Secondary | ICD-10-CM | POA: Diagnosis not present

## 2022-09-07 DIAGNOSIS — K8689 Other specified diseases of pancreas: Secondary | ICD-10-CM | POA: Diagnosis not present

## 2022-09-07 DIAGNOSIS — G894 Chronic pain syndrome: Secondary | ICD-10-CM | POA: Diagnosis not present

## 2022-09-07 DIAGNOSIS — H900 Conductive hearing loss, bilateral: Secondary | ICD-10-CM | POA: Diagnosis not present

## 2022-09-08 ENCOUNTER — Other Ambulatory Visit: Payer: Self-pay | Admitting: Diagnostic Neuroimaging

## 2022-09-08 NOTE — Progress Notes (Signed)
09/09/2022 11:07 AM   Delfina Redwood 1961-01-22 [id number]  Referring provider: Danelle Berry, PA-C [address] [place],  [place] [address]  Urological history: 1.  Neurogenic bladder -managed with an indwelling Foley -UDS on 04/20/2020 small capacity 60 cc neurogenic bladder with notable right-sided reflux with relatively low bladder pressures   2. Chronic bacteriuria  -secondary to chronic indwelling Foley    Chief Complaint  Patient presents with   Medication Refill   Advice Only   HPI: ZARAY GATCHEL is a 62 y.o. fenale who presents today for medicaiton refills.   Previous records reviewed.   She has been having difficulty with the indwelling Foley, she is now ready to move onto a suprapubic tube placement.  She continues to have issues with infections and sediment.  Patient denies any modifying or aggravating factors.  Patient denies any gross hematuria, dysuria or suprapubic/flank pain.  Patient denies any fevers, chills, nausea or vomiting.   She also needs refill on the oxybutynin and Renacidin.    PMH: Past Medical History:  Diagnosis Date   Allergy    Aortic valve disease    Mild AS / AI - most recent Echo demonstrated tricuspid aortic valve.   Bacterial endocarditis    History of .   Bilateral lower extremity edema    Noncardiac.  Chronic. LE Venous dopplers - negative for DVT.; Echocardiogram January 2016: Normal EF with normal wall motion and valve function. Only grade 1 diastolic dysfunction. EF 60-65%. Mild MR   Breast cancer (HCC) 12/31/2013   Right breast, 12:00, 1.5 cm, T1c,N0 invasive mammary carcinoma, triple negative. --> Rx with Chemo   Cervical stenosis of spine    GERD (gastroesophageal reflux disease) April or May 2022   Heart murmur 2013   Herpes zoster    IBS (irritable bowel syndrome)    Lymphedema    has legs wrapped at [facility]   Multiple sclerosis (HCC) 2001   Walks from room to room @ home; but Wheelchair when going out.    Neuromuscular disorder (HCC)    MS   PONV (postoperative nausea and vomiting)    Related to Fentanyl   Seizures (HCC)    Takes Keppra   Syncope and collapse     Surgical History: Past Surgical History:  Procedure Laterality Date   ANKLE SURGERY     Left   ANKLE SURGERY     ANTERIOR CERVICAL DECOMP/DISCECTOMY FUSION  11/17/2011   Procedure: ANTERIOR CERVICAL DECOMPRESSION/DISCECTOMY FUSION 2 LEVELS;  Surgeon: Maeola Harman, MD;  Location: [facility];  Service: Neurosurgery;  Laterality: N/A;  Cervical Five-Six Six-Seven Anterior cervical decompression/diskectomy/fusion   BREAST BIOPSY Right 12/31/2013   invasive mammary   BREAST SURGERY Right 02/03/2014   Right simple mastectomy with sentinel node biopsy.   CHOLECYSTECTOMY     COLONOSCOPY  2014   ESOPHAGOGASTRODUODENOSCOPY (EGD) WITH PROPOFOL N/A 07/16/2020   Procedure: ESOPHAGOGASTRODUODENOSCOPY (EGD) WITH PROPOFOL;  Surgeon: Pasty Spillers, MD;  Location: [facility] ENDOSCOPY;  Service: Endoscopy;  Laterality: N/A;  Patient has MS and will need assistance   HYSTEROSCOPY WITH D & C N/A 11/27/2018   Procedure: DILATATION AND CURETTAGE /HYSTEROSCOPY;  Surgeon: Nadara Mustard, MD;  Location: [facility] ORS;  Service: Gynecology;  Laterality: N/A;   Lower extremity venous Dopplers  02/27/2013   No LE DVT   MASTECTOMY Right 2015   ORIF TIBIA PLATEAU Left 12/09/2020   Procedure: LEFT OPEN REDUCTION INTERNAL FIXATION (ORIF) TIBIAL PLATEAU;  Surgeon: Truitt Merle  P, MD;  Location: [facility] OR;  Service: Orthopedics;  Laterality: Left;   Port a cath insertion Right 01/19/2010   PORT-A-CATH REMOVAL     right   PORT-A-CATH REMOVAL Right 09/03/2013   Procedure: REMOVAL PORT-A-CATH;  Surgeon: Fransisco Hertz, MD;  Location: [facility] OR;  Service: Vascular;  Laterality: Right;   SPINE SURGERY  Cervical steinois Dr. Venetia Maxon 2014 I think?   TONSILLECTOMY     TRANSTHORACIC ECHOCARDIOGRAM  03/2013; 02/2014   a) Normal LV size and function with EF 60-65%.; Cannot  exclude bicuspid aortic valve with mild AS and mild AI.; b) Normal EF with normal wall motion and valve function x Mild MR. G2 DD. EF 60-65%. Tricuspid AoV   UPPER GI ENDOSCOPY  2014    Home Medications:  Allergies as of 09/09/2022       Reactions   Fentanyl Nausea And Vomiting, Nausea Only   vomiting Was given in PACU x3 each time patient got sick   Sulfa Antibiotics Hives, Other (See Comments)   Light headed, over heated        Medication List        Accurate as of September 09, 2022 11:07 AM. If you have any questions, ask your nurse or doctor.          amantadine 100 MG capsule Commonly known as: SYMMETREL TAKE 1 CAPSULE BY MOUTH THREE TIMES A DAY   Assurance Vinyl Exam Gloves Misc Neurogenic bladder; MS; LON 99 months; for use for personal hygiene   baclofen 10 MG tablet Commonly known as: LIORESAL Take 1-2 tablets (10-20 mg total) by mouth 3 (three) times daily.   COSAMIN DS PO Take 1 tablet by mouth 5 (five) times daily.   Creon 36000-114000 units Cpep capsule Generic drug: lipase/protease/amylase TAKE 1 CAPSULE BY MOUTH WITH BREAKFAST, WITH LUNCH, AND WITH EVENING MEAL AND 1 CAPSULE WITH SNACKS. What changed: See the new instructions.   lipase/protease/amylase 78295 UNITS Cpep capsule Commonly known as: Creon Take 2 capsules (72,000 Units total) by mouth 3 (three) times daily with meals AND 1 capsule (36,000 Units total) with snacks. What changed: Another medication with the same name was changed. Make sure you understand how and when to take each.   gluconic acid-citric acid irrigation Insert 30 mL into suprapubic tube and clamp tube for 10 minutes and then drain twice daily   Hair Skin and Nails Formula Tabs Take 1 tablet by mouth 2 (two) times daily.   ibuprofen 600 MG tablet Commonly known as: ADVIL TAKE 1 TABLET BY MOUTH EVERY 8 HOURS AS NEEDED. What changed: reasons to take this   Leg Vein & Circulation Tabs Take 1 tablet by mouth 2 (two) times  daily.   levETIRAcetam 500 MG tablet Commonly known as: KEPPRA Take 1 tablet (500 mg total) by mouth 2 (two) times daily.   lidocaine-prilocaine cream Commonly known as: EMLA Apply 1 application topically as needed.   loratadine 10 MG tablet Commonly known as: CLARITIN Take 10 mg by mouth daily.   MULTIVITAMIN PO Take 1 tablet by mouth daily.   nystatin powder Commonly known as: MYCOSTATIN/NYSTOP Apply 1 application topically 3 (three) times daily as needed. For rash/raw skin as needed   omeprazole 40 MG capsule Commonly known as: PRILOSEC Take 1 capsule (40 mg total) by mouth daily.   oxybutynin 10 MG 24 hr tablet Commonly known as: DITROPAN-XL Take 1 tablet (10 mg total) by mouth 2 (two) times daily.   Rebif Rebidose 44 MCG/0.5ML Soaj  Generic drug: Interferon Beta-1a INJECT 1 PEN UNDER THE SKIN 3 TIMES PER WEEK   tiZANidine 4 MG tablet Commonly known as: ZANAFLEX Take 1 tablet (4 mg total) by mouth at bedtime.   Turmeric 500 MG Caps Take 500 mg by mouth daily.        Allergies:  Allergies  Allergen Reactions   Fentanyl Nausea And Vomiting and Nausea Only    vomiting Was given in PACU x3 each time patient got sick    Sulfa Antibiotics Hives and Other (See Comments)    Light headed, over heated    Family History: Family History  Problem Relation Age of Onset   Cancer Father        skin   Heart disease Father    Heart attack Father        heart attack in his 69's   Thyroid disease Sister    Ovarian cancer Cousin    Breast cancer Maternal Aunt 74   Breast cancer Maternal Grandmother 15   Bladder Cancer Neg Hx    Kidney cancer Neg Hx     Social History:  reports that she has never smoked. She has never used smokeless tobacco. She reports that she does not drink alcohol and does not use drugs.  ROS: Pertinent ROS in HPI  Physical Exam: BP 123/76   Pulse 67   Ht 5\' 4"  (1.626 m)   Wt 223 lb (101.2 kg)   BMI 38.28 kg/m   Constitutional:   Well nourished. Alert and oriented, No acute distress. HEENT: [place] AT, moist mucus membranes.  Trachea midline Cardiovascular: No clubbing, cyanosis, or edema. Respiratory: Normal respiratory effort, no increased work of breathing. Neurologic: Grossly intact, no focal deficits, moving all 4 extremities.  She is brought in by stretcher, but she states they are working on her ability to stand to transfer.  Psychiatric: Normal mood and affect.    Laboratory Data: Lab Results  Component Value Date   WBC 3.6 (L) 08/13/2022   HGB 13.5 08/13/2022   HCT 41.9 08/13/2022   MCV 93.5 08/13/2022   PLT 166 08/13/2022    Lab Results  Component Value Date   CREATININE 0.52 08/13/2022    Lab Results  Component Value Date   HGBA1C 5.1 09/27/2019    Lab Results  Component Value Date   AST 24 07/07/2022   Lab Results  Component Value Date   ALT 31 (H) 07/07/2022    Urinalysis    Component Value Date/Time   COLORURINE YELLOW (A) 08/13/2022 1601   APPEARANCEUR TURBID (A) 08/13/2022 1601   APPEARANCEUR Clear 04/03/2017 1409   LABSPEC 1.025 08/13/2022 1601   LABSPEC 1.014 02/28/2011 1534   PHURINE 7.0 08/13/2022 1601   GLUCOSEU NEGATIVE 08/13/2022 1601   GLUCOSEU Negative 02/28/2011 1534   HGBUR NEGATIVE 08/13/2022 1601   BILIRUBINUR NEGATIVE 08/13/2022 1601   BILIRUBINUR negative 07/16/2018 1453   BILIRUBINUR Negative 04/03/2017 1409   BILIRUBINUR Negative 02/28/2011 1534   KETONESUR 5 (A) 08/13/2022 1601   PROTEINUR 100 (A) 08/13/2022 1601   UROBILINOGEN negative (A) 07/16/2018 1453   NITRITE NEGATIVE 08/13/2022 1601   LEUKOCYTESUR MODERATE (A) 08/13/2022 1601   LEUKOCYTESUR Trace 02/28/2011 1534  I have reviewed the labs.   Pertinent Imaging: N/A  Assessment & Plan:    1. Neurogenic bladder -Patient would like to transition to a suprapubic tube at this time -She does want to wait until she is a bit more stronger, show she will call us when she  is ready to schedule the  suprapubic tube -Explained that the suprapubic tube is placed by interventional radiology usually under CT guidance.  They tried to get in a 60 French catheter initially and then she will need to return in 30 days for them to exchange it to a 15 [place] two-way Foley, but sometimes they need to start with a smaller [place] catheter and if so she will need more trips to the IR to be dilated up to the 36 [place] -She voices her understanding and she wishes to proceed -I sent in refills for the oxybutynin and Renacidin  -She will call us to schedule  Return for patient to call when ready to schedule SPT placement .  These notes generated with voice recognition software. I apologize for typographical errors.  Cloretta Ned  [facility] [address]  [address] [place], [place] [address] [phone]  I spent 30 minutes on the day of the encounter to include pre-visit record review, face-to-face time with the patient, and post-visit ordering of tests.

## 2022-09-09 ENCOUNTER — Encounter: Payer: Self-pay | Admitting: Urology

## 2022-09-09 ENCOUNTER — Ambulatory Visit (INDEPENDENT_AMBULATORY_CARE_PROVIDER_SITE_OTHER): Payer: 59 | Admitting: Urology

## 2022-09-09 VITALS — BP 123/76 | HR 67 | Ht 64.0 in | Wt 223.0 lb

## 2022-09-09 DIAGNOSIS — R8290 Unspecified abnormal findings in urine: Secondary | ICD-10-CM | POA: Diagnosis not present

## 2022-09-09 DIAGNOSIS — N319 Neuromuscular dysfunction of bladder, unspecified: Secondary | ICD-10-CM | POA: Diagnosis not present

## 2022-09-09 DIAGNOSIS — Z760 Encounter for issue of repeat prescription: Secondary | ICD-10-CM

## 2022-09-09 DIAGNOSIS — R279 Unspecified lack of coordination: Secondary | ICD-10-CM | POA: Diagnosis not present

## 2022-09-09 DIAGNOSIS — Z743 Need for continuous supervision: Secondary | ICD-10-CM | POA: Diagnosis not present

## 2022-09-09 DIAGNOSIS — R69 Illness, unspecified: Secondary | ICD-10-CM | POA: Diagnosis not present

## 2022-09-09 MED ORDER — RENACIDIN IR SOLN
1 refills | Status: DC
Start: 2022-09-09 — End: 2023-07-11

## 2022-09-09 MED ORDER — OXYBUTYNIN CHLORIDE ER 10 MG PO TB24
10.0000 mg | ORAL_TABLET | Freq: Two times a day (BID) | ORAL | 0 refills | Status: DC
Start: 2022-09-09 — End: 2023-01-16

## 2022-09-11 DIAGNOSIS — M542 Cervicalgia: Secondary | ICD-10-CM | POA: Diagnosis not present

## 2022-09-11 DIAGNOSIS — I890 Lymphedema, not elsewhere classified: Secondary | ICD-10-CM | POA: Diagnosis not present

## 2022-09-11 DIAGNOSIS — I10 Essential (primary) hypertension: Secondary | ICD-10-CM | POA: Diagnosis not present

## 2022-09-11 DIAGNOSIS — K592 Neurogenic bowel, not elsewhere classified: Secondary | ICD-10-CM | POA: Diagnosis not present

## 2022-09-11 DIAGNOSIS — D696 Thrombocytopenia, unspecified: Secondary | ICD-10-CM | POA: Diagnosis not present

## 2022-09-11 DIAGNOSIS — G35 Multiple sclerosis: Secondary | ICD-10-CM | POA: Diagnosis not present

## 2022-09-11 DIAGNOSIS — D649 Anemia, unspecified: Secondary | ICD-10-CM | POA: Diagnosis not present

## 2022-09-11 DIAGNOSIS — K219 Gastro-esophageal reflux disease without esophagitis: Secondary | ICD-10-CM | POA: Diagnosis not present

## 2022-09-11 DIAGNOSIS — K8689 Other specified diseases of pancreas: Secondary | ICD-10-CM | POA: Diagnosis not present

## 2022-09-11 DIAGNOSIS — J309 Allergic rhinitis, unspecified: Secondary | ICD-10-CM | POA: Diagnosis not present

## 2022-09-11 DIAGNOSIS — K589 Irritable bowel syndrome without diarrhea: Secondary | ICD-10-CM | POA: Diagnosis not present

## 2022-09-11 DIAGNOSIS — N319 Neuromuscular dysfunction of bladder, unspecified: Secondary | ICD-10-CM | POA: Diagnosis not present

## 2022-09-11 DIAGNOSIS — D709 Neutropenia, unspecified: Secondary | ICD-10-CM | POA: Diagnosis not present

## 2022-09-11 DIAGNOSIS — R1310 Dysphagia, unspecified: Secondary | ICD-10-CM | POA: Diagnosis not present

## 2022-09-11 DIAGNOSIS — Z466 Encounter for fitting and adjustment of urinary device: Secondary | ICD-10-CM | POA: Diagnosis not present

## 2022-09-11 DIAGNOSIS — E119 Type 2 diabetes mellitus without complications: Secondary | ICD-10-CM | POA: Diagnosis not present

## 2022-09-11 DIAGNOSIS — G894 Chronic pain syndrome: Secondary | ICD-10-CM | POA: Diagnosis not present

## 2022-09-11 DIAGNOSIS — Z981 Arthrodesis status: Secondary | ICD-10-CM | POA: Diagnosis not present

## 2022-09-11 DIAGNOSIS — G801 Spastic diplegic cerebral palsy: Secondary | ICD-10-CM | POA: Diagnosis not present

## 2022-09-11 DIAGNOSIS — H6123 Impacted cerumen, bilateral: Secondary | ICD-10-CM | POA: Diagnosis not present

## 2022-09-11 DIAGNOSIS — Z853 Personal history of malignant neoplasm of breast: Secondary | ICD-10-CM | POA: Diagnosis not present

## 2022-09-11 DIAGNOSIS — K222 Esophageal obstruction: Secondary | ICD-10-CM | POA: Diagnosis not present

## 2022-09-11 DIAGNOSIS — H900 Conductive hearing loss, bilateral: Secondary | ICD-10-CM | POA: Diagnosis not present

## 2022-09-12 DIAGNOSIS — D649 Anemia, unspecified: Secondary | ICD-10-CM | POA: Diagnosis not present

## 2022-09-12 DIAGNOSIS — H6123 Impacted cerumen, bilateral: Secondary | ICD-10-CM | POA: Diagnosis not present

## 2022-09-12 DIAGNOSIS — N319 Neuromuscular dysfunction of bladder, unspecified: Secondary | ICD-10-CM | POA: Diagnosis not present

## 2022-09-12 DIAGNOSIS — Z466 Encounter for fitting and adjustment of urinary device: Secondary | ICD-10-CM | POA: Diagnosis not present

## 2022-09-12 DIAGNOSIS — K219 Gastro-esophageal reflux disease without esophagitis: Secondary | ICD-10-CM | POA: Diagnosis not present

## 2022-09-12 DIAGNOSIS — Z981 Arthrodesis status: Secondary | ICD-10-CM | POA: Diagnosis not present

## 2022-09-12 DIAGNOSIS — M542 Cervicalgia: Secondary | ICD-10-CM | POA: Diagnosis not present

## 2022-09-12 DIAGNOSIS — D709 Neutropenia, unspecified: Secondary | ICD-10-CM | POA: Diagnosis not present

## 2022-09-12 DIAGNOSIS — R1310 Dysphagia, unspecified: Secondary | ICD-10-CM | POA: Diagnosis not present

## 2022-09-12 DIAGNOSIS — K592 Neurogenic bowel, not elsewhere classified: Secondary | ICD-10-CM | POA: Diagnosis not present

## 2022-09-12 DIAGNOSIS — H900 Conductive hearing loss, bilateral: Secondary | ICD-10-CM | POA: Diagnosis not present

## 2022-09-12 DIAGNOSIS — K222 Esophageal obstruction: Secondary | ICD-10-CM | POA: Diagnosis not present

## 2022-09-12 DIAGNOSIS — I10 Essential (primary) hypertension: Secondary | ICD-10-CM | POA: Diagnosis not present

## 2022-09-12 DIAGNOSIS — G894 Chronic pain syndrome: Secondary | ICD-10-CM | POA: Diagnosis not present

## 2022-09-12 DIAGNOSIS — G801 Spastic diplegic cerebral palsy: Secondary | ICD-10-CM | POA: Diagnosis not present

## 2022-09-12 DIAGNOSIS — D696 Thrombocytopenia, unspecified: Secondary | ICD-10-CM | POA: Diagnosis not present

## 2022-09-12 DIAGNOSIS — G35 Multiple sclerosis: Secondary | ICD-10-CM | POA: Diagnosis not present

## 2022-09-12 DIAGNOSIS — I890 Lymphedema, not elsewhere classified: Secondary | ICD-10-CM | POA: Diagnosis not present

## 2022-09-12 DIAGNOSIS — Z853 Personal history of malignant neoplasm of breast: Secondary | ICD-10-CM | POA: Diagnosis not present

## 2022-09-12 DIAGNOSIS — J309 Allergic rhinitis, unspecified: Secondary | ICD-10-CM | POA: Diagnosis not present

## 2022-09-12 DIAGNOSIS — E119 Type 2 diabetes mellitus without complications: Secondary | ICD-10-CM | POA: Diagnosis not present

## 2022-09-12 DIAGNOSIS — K589 Irritable bowel syndrome without diarrhea: Secondary | ICD-10-CM | POA: Diagnosis not present

## 2022-09-12 DIAGNOSIS — K8689 Other specified diseases of pancreas: Secondary | ICD-10-CM | POA: Diagnosis not present

## 2022-09-14 DIAGNOSIS — R262 Difficulty in walking, not elsewhere classified: Secondary | ICD-10-CM | POA: Diagnosis not present

## 2022-09-14 DIAGNOSIS — M6281 Muscle weakness (generalized): Secondary | ICD-10-CM | POA: Diagnosis not present

## 2022-09-14 DIAGNOSIS — L03116 Cellulitis of left lower limb: Secondary | ICD-10-CM | POA: Diagnosis not present

## 2022-09-16 DIAGNOSIS — K8689 Other specified diseases of pancreas: Secondary | ICD-10-CM | POA: Diagnosis not present

## 2022-09-16 DIAGNOSIS — G894 Chronic pain syndrome: Secondary | ICD-10-CM | POA: Diagnosis not present

## 2022-09-16 DIAGNOSIS — I890 Lymphedema, not elsewhere classified: Secondary | ICD-10-CM | POA: Diagnosis not present

## 2022-09-16 DIAGNOSIS — H6123 Impacted cerumen, bilateral: Secondary | ICD-10-CM | POA: Diagnosis not present

## 2022-09-16 DIAGNOSIS — H900 Conductive hearing loss, bilateral: Secondary | ICD-10-CM | POA: Diagnosis not present

## 2022-09-16 DIAGNOSIS — K592 Neurogenic bowel, not elsewhere classified: Secondary | ICD-10-CM | POA: Diagnosis not present

## 2022-09-16 DIAGNOSIS — Z981 Arthrodesis status: Secondary | ICD-10-CM | POA: Diagnosis not present

## 2022-09-16 DIAGNOSIS — K589 Irritable bowel syndrome without diarrhea: Secondary | ICD-10-CM | POA: Diagnosis not present

## 2022-09-16 DIAGNOSIS — K222 Esophageal obstruction: Secondary | ICD-10-CM | POA: Diagnosis not present

## 2022-09-16 DIAGNOSIS — N319 Neuromuscular dysfunction of bladder, unspecified: Secondary | ICD-10-CM | POA: Diagnosis not present

## 2022-09-16 DIAGNOSIS — I10 Essential (primary) hypertension: Secondary | ICD-10-CM | POA: Diagnosis not present

## 2022-09-16 DIAGNOSIS — R1310 Dysphagia, unspecified: Secondary | ICD-10-CM | POA: Diagnosis not present

## 2022-09-16 DIAGNOSIS — G35 Multiple sclerosis: Secondary | ICD-10-CM | POA: Diagnosis not present

## 2022-09-16 DIAGNOSIS — D696 Thrombocytopenia, unspecified: Secondary | ICD-10-CM | POA: Diagnosis not present

## 2022-09-16 DIAGNOSIS — Z466 Encounter for fitting and adjustment of urinary device: Secondary | ICD-10-CM | POA: Diagnosis not present

## 2022-09-16 DIAGNOSIS — D649 Anemia, unspecified: Secondary | ICD-10-CM | POA: Diagnosis not present

## 2022-09-16 DIAGNOSIS — Z853 Personal history of malignant neoplasm of breast: Secondary | ICD-10-CM | POA: Diagnosis not present

## 2022-09-16 DIAGNOSIS — J309 Allergic rhinitis, unspecified: Secondary | ICD-10-CM | POA: Diagnosis not present

## 2022-09-16 DIAGNOSIS — D709 Neutropenia, unspecified: Secondary | ICD-10-CM | POA: Diagnosis not present

## 2022-09-16 DIAGNOSIS — E119 Type 2 diabetes mellitus without complications: Secondary | ICD-10-CM | POA: Diagnosis not present

## 2022-09-16 DIAGNOSIS — G801 Spastic diplegic cerebral palsy: Secondary | ICD-10-CM | POA: Diagnosis not present

## 2022-09-16 DIAGNOSIS — M542 Cervicalgia: Secondary | ICD-10-CM | POA: Diagnosis not present

## 2022-09-16 DIAGNOSIS — K219 Gastro-esophageal reflux disease without esophagitis: Secondary | ICD-10-CM | POA: Diagnosis not present

## 2022-09-17 ENCOUNTER — Other Ambulatory Visit: Payer: Self-pay

## 2022-09-17 ENCOUNTER — Emergency Department
Admission: EM | Admit: 2022-09-17 | Discharge: 2022-09-18 | Disposition: A | Discharge status: Home / Self Care | Payer: 59 | Attending: Emergency Medicine | Admitting: Emergency Medicine

## 2022-09-17 ENCOUNTER — Encounter: Payer: Self-pay | Admitting: Emergency Medicine

## 2022-09-17 DIAGNOSIS — R82998 Other abnormal findings in urine: Secondary | ICD-10-CM | POA: Diagnosis not present

## 2022-09-17 DIAGNOSIS — N390 Urinary tract infection, site not specified: Secondary | ICD-10-CM

## 2022-09-17 DIAGNOSIS — Z743 Need for continuous supervision: Secondary | ICD-10-CM | POA: Diagnosis not present

## 2022-09-17 DIAGNOSIS — R6889 Other general symptoms and signs: Secondary | ICD-10-CM | POA: Diagnosis not present

## 2022-09-17 DIAGNOSIS — T83511A Infection and inflammatory reaction due to indwelling urethral catheter, initial encounter: Secondary | ICD-10-CM | POA: Diagnosis present

## 2022-09-17 DIAGNOSIS — N319 Neuromuscular dysfunction of bladder, unspecified: Secondary | ICD-10-CM

## 2022-09-17 DIAGNOSIS — T83011A Breakdown (mechanical) of indwelling urethral catheter, initial encounter: Secondary | ICD-10-CM

## 2022-09-17 DIAGNOSIS — T83098A Other mechanical complication of other indwelling urethral catheter, initial encounter: Secondary | ICD-10-CM | POA: Diagnosis not present

## 2022-09-17 MED ORDER — CEPHALEXIN 500 MG PO CAPS
500.0000 mg | ORAL_CAPSULE | Freq: Once | ORAL | Status: AC
  Administered 2022-09-17: 500 mg via ORAL
  Filled 2022-09-17: qty 1

## 2022-09-17 NOTE — ED Triage Notes (Signed)
First RN Note: pt to ED via [facility] from home. Per EMS pt noticed urine leaking from catheter and that her brief was wet. Per EMS pt has noted worsening leaking from her foley throughout the day. Per EMS pt c/o foul odor from urine, EMS noted sediment to urine in foley bag.  Per EMS pt denies pain, VSS as follow: 168/84, 86 HR, 97% RA.   Pt previously only had 5mL placed in foley balloon due to neuromuscular hx and bladder issues .

## 2022-09-17 NOTE — ED Provider Notes (Signed)
[facility] Provider Note    Event Date/Time   First MD Initiated Contact with Patient 09/17/22 2318     (approximate)   History   No chief complaint on file.   HPI Rebecca [name] is a 62 y.o. female with MS and chronic neurogenic bladder.  She presents by EMS for evaluation of a malfunctioning Foley catheter.  She has had this problem several times in the past, most recently about 5 weeks ago.  She said that the balloon and the catheter can only be inflated to about 5 mL.  A home health representative came out to help her but she thinks that something was not done correctly because she has been leaking urine ever since.  Of note, she also states that about 3 days ago her urine, which she normally takes care of herself by cleaning out and flushing her bag regularly, became much darker and much more foul-smelling with heavy sediment and clumps of material within it.  She is concerned that she may have developed a urinary tract infection.  She acknowledges that she does not typically take antibiotics or need to have it treated because she knows that there is always going to be bacteria present, but she is concerned that this is a change from normal.  She denies fever, chest pain, shortness of breath, nausea, vomiting, and abdominal pain.     Physical Exam   Triage Vital Signs: ED Triage Vitals  Encounter Vitals Group     BP 09/17/22 1956 139/70     Systolic BP Percentile --      Diastolic BP Percentile --      Pulse Rate 09/17/22 1956 86     Resp 09/17/22 1956 18     Temp 09/17/22 1956 98.5 F (36.9 C)     Temp Source 09/17/22 2237 Oral     SpO2 09/17/22 1956 95 %     Weight 09/17/22 1954 101.1 kg (222 lb 14.2 oz)     Height 09/17/22 1954 1.626 m (5\' 4" )     Head Circumference --      Peak Flow --      Pain Score 09/17/22 1954 0     Pain Loc --      Pain Education --      Exclude from Growth Chart --     Most recent vital signs: Vitals:   09/17/22  1956 09/17/22 2237  BP: 139/70 130/60  Pulse: 86 68  Resp: 18 18  Temp: 98.5 F (36.9 C) 97.7 F (36.5 C)  SpO2: 95% 94%    General: Awake, no distress.  CV:  Good peripheral perfusion.  Resp:  Normal effort. Speaking easily and comfortably, no accessory muscle usage nor intercostal retractions.   Abd:  No distention.  Other:  Baseline neurological status for her MS.  Foley catheter bag has dark brown urine with clumps of sediment and visible debris within the tubing as well as in the bag itself.  Foul-smelling.   ED Results / Procedures / Treatments   Labs (all labs ordered are listed, but only abnormal results are displayed) Labs Reviewed  URINE CULTURE  URINALYSIS, COMPLETE (UACMP) WITH MICROSCOPIC     PROCEDURES:  Critical Care performed: No  Procedures    IMPRESSION / MDM / ASSESSMENT AND PLAN / ED COURSE  I reviewed the triage vital signs and the nursing notes.  Differential diagnosis includes, but is not limited to, catheter malfunction, UTI, chronic pyuria.  Patient's presentation is most consistent with acute presentation with potential threat to life or bodily function.  Labs/studies ordered: Urinalysis, urine culture  Interventions/Medications given:  Medications  cephALEXin (KEFLEX) capsule 500 mg (500 mg Oral Given 09/17/22 2338)    (Note:  hospital course my include additional interventions and/or labs/studies not listed above.)   Vital signs are within normal limits and the patient has no systemic sign of infection.  Ordered replacement of Foley catheter.  Given the patient's concern over recent changes to the quality of her urine, I ordered a urinalysis and a urine culture.  I reviewed prior culture data showing that she had a primarily Enterobacter UTI just over a month ago that was pan-sensitive to antibiotics except for nitrofurantoin which was intermediate sensitivity.  I am concerned that her infection has  progressed and before she can become systemically ill, the patient would prefer and I think it is appropriate to treat her.  I gave her a dose of cephalexin in the ED and an outpatient prescription for cefadroxil.  I also sent a message through [facility] to the two PA providers at [facility] that she has seen in the past to let them know about her current situation so they could follow-up.  The patient is comfortable with this plan, and I gave my usual and customary return precautions.      FINAL CLINICAL IMPRESSION(S) / ED DIAGNOSES   Final diagnoses:  Malfunction of Foley catheter, initial encounter (HCC)  Urinary tract infection associated with indwelling urethral catheter, initial encounter (HCC)  Neurogenic bladder     Rx / DC Orders   ED Discharge Orders          Ordered    cefadroxil (DURICEF) 500 MG capsule  2 times daily        09/18/22 0040             Note:  This document was prepared using Dragon voice recognition software and may include unintentional dictation errors.   Loleta Rose, MD 09/18/22 [phone]

## 2022-09-18 DIAGNOSIS — Z7401 Bed confinement status: Secondary | ICD-10-CM | POA: Diagnosis not present

## 2022-09-18 DIAGNOSIS — Z743 Need for continuous supervision: Secondary | ICD-10-CM | POA: Diagnosis not present

## 2022-09-18 DIAGNOSIS — R531 Weakness: Secondary | ICD-10-CM | POA: Diagnosis not present

## 2022-09-18 LAB — URINALYSIS, COMPLETE (UACMP) WITH MICROSCOPIC
Bilirubin Urine: NEGATIVE
Glucose, UA: NEGATIVE mg/dL
Hgb urine dipstick: NEGATIVE
Ketones, ur: NEGATIVE mg/dL
Nitrite: POSITIVE — AB
Protein, ur: 100 mg/dL — AB
Specific Gravity, Urine: 1.019 (ref 1.005–1.030)
WBC, UA: >50 WBC/hpf (ref 0–5)
pH: 6 (ref 5.0–8.0)

## 2022-09-18 MED ORDER — CEFADROXIL 500 MG PO CAPS: 1000.0000 mg | ORAL_CAPSULE | Freq: Two times a day (BID) | ORAL | 0 refills | Status: AC

## 2022-09-18 NOTE — Discharge Instructions (Addendum)
As we discussed, given the recent changes to your urine, we agreed that it would be appropriate to send a urinalysis and urine culture, as well as treating you with antibiotics after replacing your Foley catheter.  Please follow-up with a provider at [facility] at the next available opportunity.  We sent a message to them through the computer system so they would also be aware of your recent visit and the medication plan.    Return to the emergency department if you develop new or worsening symptoms that concern you.

## 2022-09-19 ENCOUNTER — Telehealth: Payer: Self-pay | Admitting: Urology

## 2022-09-19 LAB — URINE CULTURE

## 2022-09-19 NOTE — Telephone Encounter (Signed)
LMTRC

## 2022-09-19 NOTE — Telephone Encounter (Signed)
She was seen in the emergency department over the weekend for a UTI and was prescribed Duricef.  Her urine culture grew out multiple species.  Would you call her and see if she is noting an improvement in her symptoms while taking the antibiotic?  If not, we will need to get her in and repeat the urine culture.  She will need the lift.

## 2022-09-20 DIAGNOSIS — G894 Chronic pain syndrome: Secondary | ICD-10-CM | POA: Diagnosis not present

## 2022-09-20 DIAGNOSIS — K8689 Other specified diseases of pancreas: Secondary | ICD-10-CM | POA: Diagnosis not present

## 2022-09-20 DIAGNOSIS — J309 Allergic rhinitis, unspecified: Secondary | ICD-10-CM | POA: Diagnosis not present

## 2022-09-20 DIAGNOSIS — D696 Thrombocytopenia, unspecified: Secondary | ICD-10-CM | POA: Diagnosis not present

## 2022-09-20 DIAGNOSIS — G801 Spastic diplegic cerebral palsy: Secondary | ICD-10-CM | POA: Diagnosis not present

## 2022-09-20 DIAGNOSIS — H900 Conductive hearing loss, bilateral: Secondary | ICD-10-CM | POA: Diagnosis not present

## 2022-09-20 DIAGNOSIS — R1310 Dysphagia, unspecified: Secondary | ICD-10-CM | POA: Diagnosis not present

## 2022-09-20 DIAGNOSIS — I890 Lymphedema, not elsewhere classified: Secondary | ICD-10-CM | POA: Diagnosis not present

## 2022-09-20 DIAGNOSIS — I10 Essential (primary) hypertension: Secondary | ICD-10-CM | POA: Diagnosis not present

## 2022-09-20 DIAGNOSIS — Z853 Personal history of malignant neoplasm of breast: Secondary | ICD-10-CM | POA: Diagnosis not present

## 2022-09-20 DIAGNOSIS — M542 Cervicalgia: Secondary | ICD-10-CM | POA: Diagnosis not present

## 2022-09-20 DIAGNOSIS — K589 Irritable bowel syndrome without diarrhea: Secondary | ICD-10-CM | POA: Diagnosis not present

## 2022-09-20 DIAGNOSIS — D649 Anemia, unspecified: Secondary | ICD-10-CM | POA: Diagnosis not present

## 2022-09-20 DIAGNOSIS — N319 Neuromuscular dysfunction of bladder, unspecified: Secondary | ICD-10-CM | POA: Diagnosis not present

## 2022-09-20 DIAGNOSIS — K219 Gastro-esophageal reflux disease without esophagitis: Secondary | ICD-10-CM | POA: Diagnosis not present

## 2022-09-20 DIAGNOSIS — Z981 Arthrodesis status: Secondary | ICD-10-CM | POA: Diagnosis not present

## 2022-09-20 DIAGNOSIS — D709 Neutropenia, unspecified: Secondary | ICD-10-CM | POA: Diagnosis not present

## 2022-09-20 DIAGNOSIS — K222 Esophageal obstruction: Secondary | ICD-10-CM | POA: Diagnosis not present

## 2022-09-20 DIAGNOSIS — H6123 Impacted cerumen, bilateral: Secondary | ICD-10-CM | POA: Diagnosis not present

## 2022-09-20 DIAGNOSIS — G35 Multiple sclerosis: Secondary | ICD-10-CM | POA: Diagnosis not present

## 2022-09-20 DIAGNOSIS — Z466 Encounter for fitting and adjustment of urinary device: Secondary | ICD-10-CM | POA: Diagnosis not present

## 2022-09-20 DIAGNOSIS — E119 Type 2 diabetes mellitus without complications: Secondary | ICD-10-CM | POA: Diagnosis not present

## 2022-09-20 DIAGNOSIS — K592 Neurogenic bowel, not elsewhere classified: Secondary | ICD-10-CM | POA: Diagnosis not present

## 2022-09-23 ENCOUNTER — Telehealth: Payer: Self-pay

## 2022-09-23 NOTE — Telephone Encounter (Signed)
Transition Care Management Follow-up Telephone Call Date of discharge and from where: 09/18/2022 [facility] How have you been since you were released from the hospital? Patient stated her foley catheter was changed and is working properly. Any questions or concerns? No  Items Reviewed: Did the pt receive and understand the discharge instructions provided? Yes  Medications obtained and verified? Yes  Other? No  Any new allergies since your discharge? No  Dietary orders reviewed? Yes Do you have support at home? Yes   Follow up appointments reviewed:  PCP Hospital f/u appt confirmed? Yes  Scheduled to see  on 09/29/2022 @ [facility]. Specialist Hospital f/u appt confirmed?  Patient was contacted by Harle Battiest, PA-C 09/19/22 via telephone regarding ED visit.  Scheduled to see  on  @ . Are transportation arrangements needed? No  If their condition worsens, is the pt aware to call PCP or go to the Emergency Dept.? Yes Was the patient provided with contact information for the PCP's office or ED? Yes Was to pt encouraged to call back with questions or concerns? Yes  Gale Hulse [facility]  [facility] Population Health Community Resource Care Guide   [email or web address]  ?? [phone]   Website: [email or web address]  [email or web address]

## 2022-09-27 ENCOUNTER — Telehealth: Payer: Self-pay | Admitting: Family Medicine

## 2022-09-27 DIAGNOSIS — R1310 Dysphagia, unspecified: Secondary | ICD-10-CM | POA: Diagnosis not present

## 2022-09-27 DIAGNOSIS — E119 Type 2 diabetes mellitus without complications: Secondary | ICD-10-CM | POA: Diagnosis not present

## 2022-09-27 DIAGNOSIS — G894 Chronic pain syndrome: Secondary | ICD-10-CM | POA: Diagnosis not present

## 2022-09-27 DIAGNOSIS — J309 Allergic rhinitis, unspecified: Secondary | ICD-10-CM | POA: Diagnosis not present

## 2022-09-27 DIAGNOSIS — K589 Irritable bowel syndrome without diarrhea: Secondary | ICD-10-CM | POA: Diagnosis not present

## 2022-09-27 DIAGNOSIS — K222 Esophageal obstruction: Secondary | ICD-10-CM | POA: Diagnosis not present

## 2022-09-27 DIAGNOSIS — I890 Lymphedema, not elsewhere classified: Secondary | ICD-10-CM | POA: Diagnosis not present

## 2022-09-27 DIAGNOSIS — G801 Spastic diplegic cerebral palsy: Secondary | ICD-10-CM | POA: Diagnosis not present

## 2022-09-27 DIAGNOSIS — H6123 Impacted cerumen, bilateral: Secondary | ICD-10-CM | POA: Diagnosis not present

## 2022-09-27 DIAGNOSIS — K592 Neurogenic bowel, not elsewhere classified: Secondary | ICD-10-CM | POA: Diagnosis not present

## 2022-09-27 DIAGNOSIS — Z981 Arthrodesis status: Secondary | ICD-10-CM | POA: Diagnosis not present

## 2022-09-27 DIAGNOSIS — K219 Gastro-esophageal reflux disease without esophagitis: Secondary | ICD-10-CM | POA: Diagnosis not present

## 2022-09-27 DIAGNOSIS — I10 Essential (primary) hypertension: Secondary | ICD-10-CM | POA: Diagnosis not present

## 2022-09-27 DIAGNOSIS — N319 Neuromuscular dysfunction of bladder, unspecified: Secondary | ICD-10-CM | POA: Diagnosis not present

## 2022-09-27 DIAGNOSIS — M542 Cervicalgia: Secondary | ICD-10-CM | POA: Diagnosis not present

## 2022-09-27 DIAGNOSIS — D709 Neutropenia, unspecified: Secondary | ICD-10-CM | POA: Diagnosis not present

## 2022-09-27 DIAGNOSIS — H900 Conductive hearing loss, bilateral: Secondary | ICD-10-CM | POA: Diagnosis not present

## 2022-09-27 DIAGNOSIS — D696 Thrombocytopenia, unspecified: Secondary | ICD-10-CM | POA: Diagnosis not present

## 2022-09-27 DIAGNOSIS — K8689 Other specified diseases of pancreas: Secondary | ICD-10-CM | POA: Diagnosis not present

## 2022-09-27 DIAGNOSIS — Z853 Personal history of malignant neoplasm of breast: Secondary | ICD-10-CM | POA: Diagnosis not present

## 2022-09-27 DIAGNOSIS — Z466 Encounter for fitting and adjustment of urinary device: Secondary | ICD-10-CM | POA: Diagnosis not present

## 2022-09-27 DIAGNOSIS — G35 Multiple sclerosis: Secondary | ICD-10-CM | POA: Diagnosis not present

## 2022-09-27 DIAGNOSIS — D649 Anemia, unspecified: Secondary | ICD-10-CM | POA: Diagnosis not present

## 2022-09-27 NOTE — Telephone Encounter (Signed)
.  left message to have patient return my call.  

## 2022-09-27 NOTE — Telephone Encounter (Signed)
Home Health Verbal Orders - Caller/Agency: Shireen with [facility]   Callback Number: [phone] Requesting : OT Frequency: 1X8   May leave a voice mail, voice mail is secure.

## 2022-09-28 DIAGNOSIS — K592 Neurogenic bowel, not elsewhere classified: Secondary | ICD-10-CM | POA: Diagnosis not present

## 2022-09-28 DIAGNOSIS — I890 Lymphedema, not elsewhere classified: Secondary | ICD-10-CM | POA: Diagnosis not present

## 2022-09-28 DIAGNOSIS — G35 Multiple sclerosis: Secondary | ICD-10-CM | POA: Diagnosis not present

## 2022-09-28 DIAGNOSIS — K8689 Other specified diseases of pancreas: Secondary | ICD-10-CM | POA: Diagnosis not present

## 2022-09-28 DIAGNOSIS — G801 Spastic diplegic cerebral palsy: Secondary | ICD-10-CM | POA: Diagnosis not present

## 2022-09-28 DIAGNOSIS — D696 Thrombocytopenia, unspecified: Secondary | ICD-10-CM | POA: Diagnosis not present

## 2022-09-28 DIAGNOSIS — H6123 Impacted cerumen, bilateral: Secondary | ICD-10-CM | POA: Diagnosis not present

## 2022-09-28 DIAGNOSIS — I10 Essential (primary) hypertension: Secondary | ICD-10-CM | POA: Diagnosis not present

## 2022-09-28 DIAGNOSIS — M542 Cervicalgia: Secondary | ICD-10-CM | POA: Diagnosis not present

## 2022-09-28 DIAGNOSIS — R1310 Dysphagia, unspecified: Secondary | ICD-10-CM | POA: Diagnosis not present

## 2022-09-28 DIAGNOSIS — Z981 Arthrodesis status: Secondary | ICD-10-CM | POA: Diagnosis not present

## 2022-09-28 DIAGNOSIS — D649 Anemia, unspecified: Secondary | ICD-10-CM | POA: Diagnosis not present

## 2022-09-28 DIAGNOSIS — G894 Chronic pain syndrome: Secondary | ICD-10-CM | POA: Diagnosis not present

## 2022-09-28 DIAGNOSIS — Z853 Personal history of malignant neoplasm of breast: Secondary | ICD-10-CM | POA: Diagnosis not present

## 2022-09-28 DIAGNOSIS — E119 Type 2 diabetes mellitus without complications: Secondary | ICD-10-CM | POA: Diagnosis not present

## 2022-09-28 DIAGNOSIS — N319 Neuromuscular dysfunction of bladder, unspecified: Secondary | ICD-10-CM | POA: Diagnosis not present

## 2022-09-28 DIAGNOSIS — J309 Allergic rhinitis, unspecified: Secondary | ICD-10-CM | POA: Diagnosis not present

## 2022-09-28 DIAGNOSIS — K589 Irritable bowel syndrome without diarrhea: Secondary | ICD-10-CM | POA: Diagnosis not present

## 2022-09-28 DIAGNOSIS — K219 Gastro-esophageal reflux disease without esophagitis: Secondary | ICD-10-CM | POA: Diagnosis not present

## 2022-09-28 DIAGNOSIS — K222 Esophageal obstruction: Secondary | ICD-10-CM | POA: Diagnosis not present

## 2022-09-28 DIAGNOSIS — Z466 Encounter for fitting and adjustment of urinary device: Secondary | ICD-10-CM | POA: Diagnosis not present

## 2022-09-28 DIAGNOSIS — H900 Conductive hearing loss, bilateral: Secondary | ICD-10-CM | POA: Diagnosis not present

## 2022-09-28 DIAGNOSIS — D709 Neutropenia, unspecified: Secondary | ICD-10-CM | POA: Diagnosis not present

## 2022-09-28 NOTE — Telephone Encounter (Signed)
No answer from Shireen, left detailed vm.

## 2022-09-29 ENCOUNTER — Ambulatory Visit (INDEPENDENT_AMBULATORY_CARE_PROVIDER_SITE_OTHER): Payer: 59

## 2022-09-29 VITALS — Ht 64.0 in | Wt 222.0 lb

## 2022-09-29 DIAGNOSIS — Z0000 Encounter for general adult medical examination without abnormal findings: Secondary | ICD-10-CM | POA: Diagnosis not present

## 2022-09-29 NOTE — Patient Instructions (Signed)
Rebecca [name] , Thank you for taking time to come for your Medicare Wellness Visit. I appreciate your ongoing commitment to your health goals. Please review the following plan we discussed and let me know if I can assist you in the future.   Referrals/Orders/Follow-Ups/Clinician Recommendations: none  This is a list of the screening recommended for you and due dates:  Health Maintenance  Topic Date Due   Pap Smear  09/25/2021   COVID-19 Vaccine (4 - 2023-24 season) 10/08/2021   Colon Cancer Screening  02/07/2022   Mammogram  09/28/2022   Flu Shot  09/08/2022   Zoster (Shingles) Vaccine (1 of 2) 10/07/2022*   Hepatitis C Screening  Completed   HIV Screening  Completed   HPV Vaccine  Aged Out   DTaP/Tdap/Td vaccine  Discontinued  *Topic was postponed. The date shown is not the original due date.    Advanced directives: (Declined) Advance directive discussed with you today. Even though you declined this today, please call our office should you change your mind, and we can give you the proper paperwork for you to fill out.  Next Medicare Annual Wellness Visit scheduled for next year: Yes  10/05/2023 @ 8:15am telephone

## 2022-09-29 NOTE — Progress Notes (Signed)
Subjective:   Rebecca [name] is a 62 y.o. female who presents for Medicare Annual (Subsequent) preventive examination.  Visit Complete: Virtual  I connected with  Rebecca [name] on 09/29/22 by a audio enabled telemedicine application and verified that I am speaking with the correct person using two identifiers.  Patient Location: Home  Provider Location: Office/Clinic  I discussed the limitations of evaluation and management by telemedicine. The patient expressed understanding and agreed to proceed.  Vital Signs: Unable to obtain new vitals due to this being a telehealth visit.  Patient Medicare AWV questionnaire was completed by the patient on 09/26/22; I have confirmed that all information answered by patient is correct and no changes since this date.  Review of Systems    Cardiac Risk Factors include: obesity (BMI >30kg/m2);sedentary lifestyle    Objective:    Today's Vitals   09/26/22 0757 09/29/22 0817  Weight:  222 lb (100.7 kg)  Height:  5\' 4"  (1.626 m)  PainSc: 0-No pain    Body mass index is 38.11 kg/m.     09/29/2022    8:41 AM 09/17/2022    7:55 PM 08/13/2022    3:09 PM 08/05/2022    9:03 AM 07/29/2022    4:57 PM 07/03/2022    2:03 PM 10/06/2021   10:37 AM  Advanced Directives  Does Patient Have a Medical Advance Directive? No No No No No No No  Would patient like information on creating a medical advance directive?   No - Patient declined No - Patient declined  No - Patient declined No - Patient declined    Current Medications (verified) Outpatient Encounter Medications as of 09/29/2022  Medication Sig   amantadine (SYMMETREL) 100 MG capsule TAKE 1 CAPSULE BY MOUTH THREE TIMES A DAY   baclofen (LIORESAL) 10 MG tablet Take 1-2 tablets (10-20 mg total) by mouth 3 (three) times daily.   Disposable Gloves (ASSURANCE VINYL EXAM GLOVES) MISC Neurogenic bladder; MS; LON 99 months; for use for personal hygiene   gluconic acid-citric acid (RENACIDIN) irrigation Insert  30 mL into suprapubic tube and clamp tube for 10 minutes and then drain twice daily   Glucosamine-Chondroitin (COSAMIN DS PO) Take 1 tablet by mouth 5 (five) times daily.   ibuprofen (ADVIL) 600 MG tablet TAKE 1 TABLET BY MOUTH EVERY 8 HOURS AS NEEDED. (Patient taking differently: Take 600 mg by mouth every 8 (eight) hours as needed for moderate pain, mild pain or headache.)   levETIRAcetam (KEPPRA) 500 MG tablet Take 1 tablet (500 mg total) by mouth 2 (two) times daily.   lidocaine-prilocaine (EMLA) cream Apply 1 application topically as needed.   lipase/protease/amylase (CREON) 36000 UNITS CPEP capsule TAKE 1 CAPSULE BY MOUTH WITH BREAKFAST, WITH LUNCH, AND WITH EVENING MEAL AND 1 CAPSULE WITH SNACKS. (Patient taking differently: Take 36,000 Units by mouth 3 (three) times daily with meals.)   lipase/protease/amylase (CREON) 36000 UNITS CPEP capsule Take 2 capsules (72,000 Units total) by mouth 3 (three) times daily with meals AND 1 capsule (36,000 Units total) with snacks.   loratadine (CLARITIN) 10 MG tablet Take 10 mg by mouth daily.   Misc Natural Products (LEG VEIN & CIRCULATION) TABS Take 1 tablet by mouth 2 (two) times daily.    Multiple Vitamins-Minerals (HAIR SKIN AND NAILS FORMULA) TABS Take 1 tablet by mouth 2 (two) times daily.    Multiple Vitamins-Minerals (MULTIVITAMIN PO) Take 1 tablet by mouth daily.    nystatin (MYCOSTATIN/NYSTOP) powder Apply 1 application topically 3 (three) times  daily as needed. For rash/raw skin as needed   omeprazole (PRILOSEC) 40 MG capsule Take 1 capsule (40 mg total) by mouth daily.   oxybutynin (DITROPAN-XL) 10 MG 24 hr tablet Take 1 tablet (10 mg total) by mouth 2 (two) times daily.   REBIF REBIDOSE 44 MCG/0.5ML SOAJ INJECT 1 PEN UNDER THE SKIN 3 TIMES PER WEEK   tiZANidine (ZANAFLEX) 4 MG tablet Take 1 tablet (4 mg total) by mouth at bedtime.   Turmeric 500 MG CAPS Take 500 mg by mouth daily.   No facility-administered encounter medications on file as  of 09/29/2022.    Allergies (verified) Fentanyl and Sulfa antibiotics   History: Past Medical History:  Diagnosis Date   Allergy    Aortic valve disease    Mild AS / AI - most recent Echo demonstrated tricuspid aortic valve.   Bacterial endocarditis    History of .   Bilateral lower extremity edema    Noncardiac.  Chronic. LE Venous dopplers - negative for DVT.; Echocardiogram January 2016: Normal EF with normal wall motion and valve function. Only grade 1 diastolic dysfunction. EF 60-65%. Mild MR   Breast cancer (HCC) 12/31/2013   Right breast, 12:00, 1.5 cm, T1c,N0 invasive mammary carcinoma, triple negative. --> Rx with Chemo   Cervical stenosis of spine    GERD (gastroesophageal reflux disease) April or May 2022   Heart murmur 2013   Herpes zoster    IBS (irritable bowel syndrome)    Lymphedema    [facility] legs [facility] at [facility]   Multiple sclerosis (HCC) 2001   Walks from room to room @ home; but Wheelchair when going out.   Neuromuscular disorder (HCC)    MS   PONV (postoperative nausea and vomiting)    Related to Fentanyl   Seizures (HCC)    Takes Keppra   Syncope and collapse    Past Surgical History:  Procedure Laterality Date   ANKLE SURGERY     Left   ANKLE SURGERY     ANTERIOR CERVICAL DECOMP/DISCECTOMY FUSION  11/17/2011   Procedure: ANTERIOR CERVICAL DECOMPRESSION/DISCECTOMY FUSION 2 LEVELS;  Surgeon: Maeola Harman, MD;  Location: [facility];  Service: Neurosurgery;  Laterality: N/A;  Cervical Five-Six Six-Seven Anterior cervical decompression/diskectomy/fusion   BREAST BIOPSY Right 12/31/2013   invasive mammary   BREAST SURGERY Right 02/03/2014   Right simple mastectomy with sentinel node biopsy.   CHOLECYSTECTOMY     COLONOSCOPY  2014   ESOPHAGOGASTRODUODENOSCOPY (EGD) WITH PROPOFOL N/A 07/16/2020   Procedure: ESOPHAGOGASTRODUODENOSCOPY (EGD) WITH PROPOFOL;  Surgeon: Pasty Spillers, MD;  Location: [facility] END[facility];  Service: Endoscopy;  Laterality: N/A;   Patient has MS and will need assistance   HYSTEROSCOPY WITH D & C N/A 11/27/2018   Procedure: DILATATION AND CURETTAGE /HYSTEROSCOPY;  Surgeon: Nadara Mustard, MD;  Location: [facility] ORS;  Service: Gynecology;  Laterality: N/A;   Lower extremity venous Dopplers  02/27/2013   No LE DVT   MASTECTOMY Right 2015   ORIF TIBIA PLATEAU Left 12/09/2020   Procedure: LEFT OPEN REDUCTION INTERNAL FIXATION (ORIF) TIBIAL PLATEAU;  Surgeon: Roby Lofts, MD;  Location: [facility] OR;  Service: Orthopedics;  Laterality: Left;   Port a cath insertion Right 01/19/2010   PORT-A-CATH REMOVAL     right   PORT-A-CATH REMOVAL Right 09/03/2013   Procedure: REMOVAL PORT-A-CATH;  Surgeon: Fransisco Hertz, MD;  Location: [facility] OR;  Service: Vascular;  Laterality: Right;   SPINE SURGERY  Cervical steinois Dr. Venetia Maxon 2014 I think?  TONSILLECTOMY     TRANSTHORACIC ECHOCARDIOGRAM  03/2013; 02/2014   a) Normal LV size and function with EF 60-65%.; Cannot exclude bicuspid aortic valve with mild AS and mild AI.; b) Normal EF with normal wall motion and valve function x Mild MR. G2 DD. EF 60-65%. Tricuspid AoV   UPPER GI ENDOSCOPY  2014   Family History  Problem Relation Age of Onset   Cancer Father        skin   Heart disease Father    Heart attack Father        heart attack in his 67's   Thyroid disease Sister    Ovarian cancer Cousin    Breast cancer Maternal Aunt 60   Breast cancer Maternal Grandmother 30   Bladder Cancer Neg Hx    Kidney cancer Neg Hx    Social History   Socioeconomic History   Marital status: Married    Spouse name: don   Number of children: 0   Years of education: 12   Highest education level: 12th grade  Occupational History   Occupation: disability  Tobacco Use   Smoking status: Never   Smokeless tobacco: Never   Tobacco comments:    Never smoked. My mom smoked. Passed 04/18/20. Dad smoked, quit 1973. Sister smokes.  Vaping Use   Vaping status: Never Used  Substance and Sexual Activity    Alcohol use: No   Drug use: No   Sexual activity: Not Currently    Partners: Male    Birth control/protection: None  Other Topics Concern   Not on file  Social History Narrative   She is married. Recently moved back to [place] after being in [place] for some time. She is accompanied by her husband and aunt.   Never smoked. Never used alcohol.   Social Determinants of Health   Financial Resource Strain: Low Risk  (09/26/2022)   Overall Financial Resource Strain (CARDIA)    Difficulty of Paying Living Expenses: Not hard at all  Food Insecurity: No Food Insecurity (09/26/2022)   Hunger Vital Sign    Worried About Running Out of Food in the Last Year: Never true    Ran Out of Food in the Last Year: Never true  Transportation Needs: No Transportation Needs (09/26/2022)   PRAPARE - Administrator, Civil Service (Medical): No    Lack of Transportation (Non-Medical): No  Physical Activity: Inactive (09/26/2022)   Exercise Vital Sign    Days of Exercise per Week: 0 days    Minutes of Exercise per Session: 30 min  Stress: No Stress Concern Present (09/26/2022)   [employer] of Occupational Health - Occupational Stress Questionnaire    Feeling of Stress : Only a little  Social Connections: Moderately Isolated (09/26/2022)   Social Connection and Isolation Panel [NHANES]    Frequency of Communication with Friends and Family: Once a week    Frequency of Social Gatherings with Friends and Family: Once a week    Attends Religious Services: 1 to 4 times per year    Active Member of [employer] or Organizations: No    Attends Banker Meetings: Never    Marital Status: Married    Tobacco Counseling Counseling given: Not Answered Tobacco comments: Never smoked. My mom smoked. Passed 04/18/20. Dad smoked, quit 1973. Sister smokes.   Clinical Intake:  Pre-visit preparation completed: Yes  Pain : No/denies pain Pain Score: 0-No pain     BMI - recorded:  38.11 Nutritional Status:  BMI > 30  Obese Nutritional Risks: None Diabetes: No  How often do you need to have someone help you when you read instructions, pamphlets, or other written materials from your doctor or pharmacy?: 1 - Never  Interpreter Needed?: No  Comments: lives with husband Information entered by :: B.Shuntae Herzig,LPN   Activities of Daily Living    09/26/2022    7:57 AM 07/07/2022   10:55 AM  In your present state of health, do you have any difficulty performing the following activities:  Hearing? 0 0  Vision? 0 0  Difficulty concentrating or making decisions? 0 0  Walking or climbing stairs? 1 1  Dressing or bathing? 1 1  Doing errands, shopping? 1 1  Preparing Food and eating ? Y   Using the Toilet? Y   In the past six months, have you accidently leaked urine? Y   Do you have problems with loss of bowel control? Y   Managing your Medications? N   Managing your Finances? N   Housekeeping or managing your Housekeeping? Y     Patient Care Team: Danelle Berry, PA-C as PCP - General (Family Medicine) Edwena Felty, MD as Referring Physician Byrnett, Merrily Pew, MD (General Surgery) Suanne Marker, MD as Consulting Physician (Neurology) Jeralyn Ruths, MD as Consulting Physician (Oncology) Marykay Lex, MD as Consulting Physician (Cardiology) Antonieta Iba, MD as Consulting Physician (Cardiology) Hulan Fray as Physician Assistant (Urology) Andee Poles, [facility] (Inactive) (Pharmacist) Pa, [facility] (Optometry)  Indicate any recent Medical Services you may have received from other than [facility] providers in the past year (date may be approximate).     Assessment:   This is a routine wellness examination for Rhen.  Hearing/Vision screen Hearing Screening - Comments:: Adequate hearing Vision Screening - Comments:: Adequate eye with glasses [facility]  Dietary issues and exercise activities discussed:     Goals  Addressed             This Visit's Progress    Patient Stated   On track    Patient states she would like to improve gait and balance with strength exercises, physical therapy and use of braces.         Depression Screen    09/29/2022    8:31 AM 07/07/2022   10:55 AM 08/17/2021    1:22 PM 08/13/2020    2:43 PM 04/14/2020   11:54 AM 02/03/2020    9:01 AM 08/13/2019    2:41 PM  PHQ 2/9 Scores  PHQ - 2 Score 0 0 0 0 0 0 0  PHQ- 9 Score  0 0        Fall Risk    09/26/2022    7:57 AM 08/17/2021    1:21 PM 08/13/2020    2:47 PM 04/14/2020   11:53 AM 02/03/2020    9:00 AM  Fall Risk   Falls in the past year? 0 0 1 1 0  Number falls in past yr: 0 0 1 0 0  Injury with Fall? 1 0 0 1 1  Risk for fall due to : Impaired balance/gait;Impaired mobility Impaired mobility History of fall(s);Impaired balance/gait;Impaired mobility    Follow up Falls prevention discussed;Education provided Falls prevention discussed Falls prevention discussed  Falls evaluation completed    MEDICARE RISK AT HOME: Medicare Risk at Home Any stairs in or around the home?: No If so, are there any without handrails?: Yes Home free of loose throw rugs in walkways,  pet beds, electrical cords, etc?: Yes Adequate lighting in your home to reduce risk of falls?: Yes Life alert?: Yes Use of a cane, walker or w/c?: Yes Grab bars in the bathroom?: No Shower chair or bench in shower?: No Elevated toilet seat or a handicapped toilet?: Yes  TIMED UP AND GO:  Was the test performed?  No    Cognitive Function:        09/29/2022    8:37 AM 08/17/2021    2:26 PM  6CIT Screen  What Year? 0 points 0 points  What month? 0 points 0 points  What time? 0 points 0 points  Count back from 20 0 points 0 points  Months in reverse 0 points 0 points  Repeat phrase 0 points 0 points  Total Score 0 points 0 points    Immunizations Immunization History  Administered Date(s) Administered   Influenza,inj,Quad PF,6+ Mos  10/24/2014, 11/25/2015, 10/28/2016, 12/26/2017, 11/20/2018, 12/01/2020   Moderna Sars-Covid-2 Vaccination 07/02/2019, 07/31/2019   Pneumococcal Polysaccharide-23 02/03/2018   Tdap 04/09/2012   Unspecified SARS-COV-2 Vaccination 06/07/2021    TDAP status: Up to date  Flu Vaccine status: Up to date  Pneumococcal vaccine status: Up to date  Covid-19 vaccine status: Completed vaccines  Qualifies for Shingles Vaccine? Yes   Zostavax completed Yes  #1 Shingrix Completed?: No.    Education has been provided regarding the importance of this vaccine. Patient has been advised to call insurance company to determine out of pocket expense if they have not yet received this vaccine. Advised may also receive vaccine at local pharmacy or Health Dept. Verbalized acceptance and understanding.  Screening Tests Health Maintenance  Topic Date Due   PAP SMEAR-Modifier  09/25/2021   COVID-19 Vaccine (4 - 2023-24 season) 10/08/2021   Colonoscopy  02/07/2022   MAMMOGRAM  09/28/2022   INFLUENZA VACCINE  09/08/2022   Zoster Vaccines- Shingrix (1 of 2) 10/07/2022 (Originally 04/16/1979)   Hepatitis C Screening  Completed   HIV Screening  Completed   HPV VACCINES  Aged Out   DTaP/Tdap/Td  Discontinued    Health Maintenance  Health Maintenance Due  Topic Date Due   PAP SMEAR-Modifier  09/25/2021   COVID-19 Vaccine (4 - 2023-24 season) 10/08/2021   Colonoscopy  02/07/2022   MAMMOGRAM  09/28/2022   INFLUENZA VACCINE  09/08/2022    Colorectal cancer screening: Type of screening: Cologuard. Completed no. Repeat every 3 years Pt has kit at home to complete  Mammogram status: Completed yes. Repeat every year Next in October 2024 on lft breast  Lung Cancer Screening: (Low Dose CT Chest recommended if Age 32-80 years, 20 pack-year currently smoking OR have quit w/in 15years.) does not qualify.   Lung Cancer Screening Referral: no  Additional Screening:  Hepatitis C Screening: does not qualify;  Completed yes  Vision Screening: Recommended annual ophthalmology exams for early detection of glaucoma and other disorders of the eye. Is the patient up to date with their annual eye exam?  Yes  Who is the provider or what is the name of the office in which the patient attends annual eye exams? [facility] If pt is not established with a provider, would they like to be referred to a provider to establish care? No .   Dental Screening: Recommended annual dental exams for proper oral hygiene  Diabetic Foot Exam: n/a  Community Resource Referral / Chronic Care Management: CRR required this visit?  No   CCM required this visit?  No  Plan:     I have personally reviewed and noted the following in the patient's chart:   Medical and social history Use of alcohol, tobacco or illicit drugs  Current medications and supplements including opioid prescriptions. Patient is not currently taking opioid prescriptions. Functional ability and status Nutritional status Physical activity Advanced directives List of other physicians Hospitalizations, surgeries, and ER visits in previous 12 months Vitals Screenings to include cognitive, depression, and falls Referrals and appointments  In addition, I have reviewed and discussed with patient certain preventive protocols, quality metrics, and best practice recommendations. A written personalized care plan for preventive services as well as general preventive health recommendations were provided to patient.     Sue Lush, LPN   05/16/8117   After Visit Summary: (MyChart) Due to this being a telephonic visit, the after visit summary with patients personalized plan was offered to patient via MyChart   Nurse Notes: Pt sts she is on ABT for UTI at this time. Pt uses w/c and walker for ambulating and transfering. She relays she working on strengthening and standing. She has a Foley and wears depends. Pt relays she has a HHA 6 days a week to  assist with ADL's and PT/OT come 3xweekly. Pt in good spirits and talks of how glad she is to be home (out of facility). She has no concerns or questions at this time.

## 2022-09-30 ENCOUNTER — Inpatient Hospital Stay: Payer: 59 | Attending: Oncology

## 2022-09-30 NOTE — Telephone Encounter (Signed)
3rd message left, will close out phone note

## 2022-10-03 ENCOUNTER — Telehealth: Payer: Self-pay | Admitting: Family Medicine

## 2022-10-03 NOTE — Telephone Encounter (Signed)
Home Health Verbal Orders - Caller/AgencyLamont Snowball PT  Callback Number: [phone] Requesting OT/PT/Skilled Nursing/Social Work/Speech Therapy: PT Frequency:   1w9

## 2022-10-04 NOTE — Telephone Encounter (Signed)
VO given to [place].

## 2022-10-05 DIAGNOSIS — K219 Gastro-esophageal reflux disease without esophagitis: Secondary | ICD-10-CM | POA: Diagnosis not present

## 2022-10-05 DIAGNOSIS — D709 Neutropenia, unspecified: Secondary | ICD-10-CM | POA: Diagnosis not present

## 2022-10-05 DIAGNOSIS — K8689 Other specified diseases of pancreas: Secondary | ICD-10-CM | POA: Diagnosis not present

## 2022-10-05 DIAGNOSIS — H6123 Impacted cerumen, bilateral: Secondary | ICD-10-CM | POA: Diagnosis not present

## 2022-10-05 DIAGNOSIS — I10 Essential (primary) hypertension: Secondary | ICD-10-CM | POA: Diagnosis not present

## 2022-10-05 DIAGNOSIS — K592 Neurogenic bowel, not elsewhere classified: Secondary | ICD-10-CM | POA: Diagnosis not present

## 2022-10-05 DIAGNOSIS — J309 Allergic rhinitis, unspecified: Secondary | ICD-10-CM | POA: Diagnosis not present

## 2022-10-05 DIAGNOSIS — G894 Chronic pain syndrome: Secondary | ICD-10-CM | POA: Diagnosis not present

## 2022-10-05 DIAGNOSIS — D696 Thrombocytopenia, unspecified: Secondary | ICD-10-CM | POA: Diagnosis not present

## 2022-10-05 DIAGNOSIS — H900 Conductive hearing loss, bilateral: Secondary | ICD-10-CM | POA: Diagnosis not present

## 2022-10-05 DIAGNOSIS — D649 Anemia, unspecified: Secondary | ICD-10-CM | POA: Diagnosis not present

## 2022-10-05 DIAGNOSIS — K222 Esophageal obstruction: Secondary | ICD-10-CM | POA: Diagnosis not present

## 2022-10-05 DIAGNOSIS — I890 Lymphedema, not elsewhere classified: Secondary | ICD-10-CM | POA: Diagnosis not present

## 2022-10-05 DIAGNOSIS — M542 Cervicalgia: Secondary | ICD-10-CM | POA: Diagnosis not present

## 2022-10-05 DIAGNOSIS — Z981 Arthrodesis status: Secondary | ICD-10-CM | POA: Diagnosis not present

## 2022-10-05 DIAGNOSIS — G35 Multiple sclerosis: Secondary | ICD-10-CM | POA: Diagnosis not present

## 2022-10-05 DIAGNOSIS — R1310 Dysphagia, unspecified: Secondary | ICD-10-CM | POA: Diagnosis not present

## 2022-10-05 DIAGNOSIS — N319 Neuromuscular dysfunction of bladder, unspecified: Secondary | ICD-10-CM | POA: Diagnosis not present

## 2022-10-05 DIAGNOSIS — Z466 Encounter for fitting and adjustment of urinary device: Secondary | ICD-10-CM | POA: Diagnosis not present

## 2022-10-05 DIAGNOSIS — G801 Spastic diplegic cerebral palsy: Secondary | ICD-10-CM | POA: Diagnosis not present

## 2022-10-05 DIAGNOSIS — E119 Type 2 diabetes mellitus without complications: Secondary | ICD-10-CM | POA: Diagnosis not present

## 2022-10-05 DIAGNOSIS — K589 Irritable bowel syndrome without diarrhea: Secondary | ICD-10-CM | POA: Diagnosis not present

## 2022-10-05 DIAGNOSIS — Z853 Personal history of malignant neoplasm of breast: Secondary | ICD-10-CM | POA: Diagnosis not present

## 2022-10-06 ENCOUNTER — Other Ambulatory Visit: Payer: Self-pay | Admitting: Diagnostic Neuroimaging

## 2022-10-06 ENCOUNTER — Inpatient Hospital Stay: Payer: 59 | Attending: Oncology

## 2022-10-07 DIAGNOSIS — G35 Multiple sclerosis: Secondary | ICD-10-CM | POA: Diagnosis not present

## 2022-10-07 DIAGNOSIS — Z466 Encounter for fitting and adjustment of urinary device: Secondary | ICD-10-CM | POA: Diagnosis not present

## 2022-10-07 DIAGNOSIS — D696 Thrombocytopenia, unspecified: Secondary | ICD-10-CM | POA: Diagnosis not present

## 2022-10-07 DIAGNOSIS — K222 Esophageal obstruction: Secondary | ICD-10-CM | POA: Diagnosis not present

## 2022-10-07 DIAGNOSIS — H6123 Impacted cerumen, bilateral: Secondary | ICD-10-CM | POA: Diagnosis not present

## 2022-10-07 DIAGNOSIS — K8689 Other specified diseases of pancreas: Secondary | ICD-10-CM | POA: Diagnosis not present

## 2022-10-07 DIAGNOSIS — D649 Anemia, unspecified: Secondary | ICD-10-CM | POA: Diagnosis not present

## 2022-10-07 DIAGNOSIS — Z853 Personal history of malignant neoplasm of breast: Secondary | ICD-10-CM | POA: Diagnosis not present

## 2022-10-07 DIAGNOSIS — N319 Neuromuscular dysfunction of bladder, unspecified: Secondary | ICD-10-CM | POA: Diagnosis not present

## 2022-10-07 DIAGNOSIS — G801 Spastic diplegic cerebral palsy: Secondary | ICD-10-CM | POA: Diagnosis not present

## 2022-10-07 DIAGNOSIS — Z981 Arthrodesis status: Secondary | ICD-10-CM | POA: Diagnosis not present

## 2022-10-07 DIAGNOSIS — E119 Type 2 diabetes mellitus without complications: Secondary | ICD-10-CM | POA: Diagnosis not present

## 2022-10-07 DIAGNOSIS — K592 Neurogenic bowel, not elsewhere classified: Secondary | ICD-10-CM | POA: Diagnosis not present

## 2022-10-07 DIAGNOSIS — R1310 Dysphagia, unspecified: Secondary | ICD-10-CM | POA: Diagnosis not present

## 2022-10-07 DIAGNOSIS — I890 Lymphedema, not elsewhere classified: Secondary | ICD-10-CM | POA: Diagnosis not present

## 2022-10-07 DIAGNOSIS — K219 Gastro-esophageal reflux disease without esophagitis: Secondary | ICD-10-CM | POA: Diagnosis not present

## 2022-10-07 DIAGNOSIS — J309 Allergic rhinitis, unspecified: Secondary | ICD-10-CM | POA: Diagnosis not present

## 2022-10-07 DIAGNOSIS — G894 Chronic pain syndrome: Secondary | ICD-10-CM | POA: Diagnosis not present

## 2022-10-07 DIAGNOSIS — H900 Conductive hearing loss, bilateral: Secondary | ICD-10-CM | POA: Diagnosis not present

## 2022-10-07 DIAGNOSIS — M542 Cervicalgia: Secondary | ICD-10-CM | POA: Diagnosis not present

## 2022-10-07 DIAGNOSIS — I10 Essential (primary) hypertension: Secondary | ICD-10-CM | POA: Diagnosis not present

## 2022-10-07 DIAGNOSIS — D709 Neutropenia, unspecified: Secondary | ICD-10-CM | POA: Diagnosis not present

## 2022-10-07 DIAGNOSIS — K589 Irritable bowel syndrome without diarrhea: Secondary | ICD-10-CM | POA: Diagnosis not present

## 2022-10-12 DIAGNOSIS — H900 Conductive hearing loss, bilateral: Secondary | ICD-10-CM | POA: Diagnosis not present

## 2022-10-12 DIAGNOSIS — I10 Essential (primary) hypertension: Secondary | ICD-10-CM | POA: Diagnosis not present

## 2022-10-12 DIAGNOSIS — Z853 Personal history of malignant neoplasm of breast: Secondary | ICD-10-CM | POA: Diagnosis not present

## 2022-10-12 DIAGNOSIS — N319 Neuromuscular dysfunction of bladder, unspecified: Secondary | ICD-10-CM | POA: Diagnosis not present

## 2022-10-12 DIAGNOSIS — Z466 Encounter for fitting and adjustment of urinary device: Secondary | ICD-10-CM | POA: Diagnosis not present

## 2022-10-12 DIAGNOSIS — Z981 Arthrodesis status: Secondary | ICD-10-CM | POA: Diagnosis not present

## 2022-10-12 DIAGNOSIS — H6123 Impacted cerumen, bilateral: Secondary | ICD-10-CM | POA: Diagnosis not present

## 2022-10-12 DIAGNOSIS — M542 Cervicalgia: Secondary | ICD-10-CM | POA: Diagnosis not present

## 2022-10-12 DIAGNOSIS — K8689 Other specified diseases of pancreas: Secondary | ICD-10-CM | POA: Diagnosis not present

## 2022-10-12 DIAGNOSIS — G801 Spastic diplegic cerebral palsy: Secondary | ICD-10-CM | POA: Diagnosis not present

## 2022-10-12 DIAGNOSIS — K589 Irritable bowel syndrome without diarrhea: Secondary | ICD-10-CM | POA: Diagnosis not present

## 2022-10-12 DIAGNOSIS — K222 Esophageal obstruction: Secondary | ICD-10-CM | POA: Diagnosis not present

## 2022-10-12 DIAGNOSIS — I890 Lymphedema, not elsewhere classified: Secondary | ICD-10-CM | POA: Diagnosis not present

## 2022-10-12 DIAGNOSIS — D709 Neutropenia, unspecified: Secondary | ICD-10-CM | POA: Diagnosis not present

## 2022-10-12 DIAGNOSIS — D649 Anemia, unspecified: Secondary | ICD-10-CM | POA: Diagnosis not present

## 2022-10-12 DIAGNOSIS — G35 Multiple sclerosis: Secondary | ICD-10-CM | POA: Diagnosis not present

## 2022-10-12 DIAGNOSIS — J309 Allergic rhinitis, unspecified: Secondary | ICD-10-CM | POA: Diagnosis not present

## 2022-10-12 DIAGNOSIS — K219 Gastro-esophageal reflux disease without esophagitis: Secondary | ICD-10-CM | POA: Diagnosis not present

## 2022-10-12 DIAGNOSIS — K592 Neurogenic bowel, not elsewhere classified: Secondary | ICD-10-CM | POA: Diagnosis not present

## 2022-10-12 DIAGNOSIS — D696 Thrombocytopenia, unspecified: Secondary | ICD-10-CM | POA: Diagnosis not present

## 2022-10-12 DIAGNOSIS — R1310 Dysphagia, unspecified: Secondary | ICD-10-CM | POA: Diagnosis not present

## 2022-10-12 DIAGNOSIS — G894 Chronic pain syndrome: Secondary | ICD-10-CM | POA: Diagnosis not present

## 2022-10-12 DIAGNOSIS — E119 Type 2 diabetes mellitus without complications: Secondary | ICD-10-CM | POA: Diagnosis not present

## 2022-10-13 ENCOUNTER — Inpatient Hospital Stay: Payer: 59 | Attending: Oncology

## 2022-10-13 DIAGNOSIS — K8689 Other specified diseases of pancreas: Secondary | ICD-10-CM | POA: Diagnosis not present

## 2022-10-13 DIAGNOSIS — E119 Type 2 diabetes mellitus without complications: Secondary | ICD-10-CM | POA: Diagnosis not present

## 2022-10-13 DIAGNOSIS — Z853 Personal history of malignant neoplasm of breast: Secondary | ICD-10-CM | POA: Insufficient documentation

## 2022-10-13 DIAGNOSIS — M542 Cervicalgia: Secondary | ICD-10-CM | POA: Diagnosis not present

## 2022-10-13 DIAGNOSIS — Z743 Need for continuous supervision: Secondary | ICD-10-CM | POA: Diagnosis not present

## 2022-10-13 DIAGNOSIS — Z466 Encounter for fitting and adjustment of urinary device: Secondary | ICD-10-CM | POA: Diagnosis not present

## 2022-10-13 DIAGNOSIS — K219 Gastro-esophageal reflux disease without esophagitis: Secondary | ICD-10-CM | POA: Diagnosis not present

## 2022-10-13 DIAGNOSIS — G35 Multiple sclerosis: Secondary | ICD-10-CM | POA: Diagnosis not present

## 2022-10-13 DIAGNOSIS — I10 Essential (primary) hypertension: Secondary | ICD-10-CM | POA: Diagnosis not present

## 2022-10-13 DIAGNOSIS — C50911 Malignant neoplasm of unspecified site of right female breast: Secondary | ICD-10-CM

## 2022-10-13 DIAGNOSIS — J309 Allergic rhinitis, unspecified: Secondary | ICD-10-CM | POA: Diagnosis not present

## 2022-10-13 DIAGNOSIS — I890 Lymphedema, not elsewhere classified: Secondary | ICD-10-CM | POA: Diagnosis not present

## 2022-10-13 DIAGNOSIS — Z981 Arthrodesis status: Secondary | ICD-10-CM | POA: Diagnosis not present

## 2022-10-13 DIAGNOSIS — H6123 Impacted cerumen, bilateral: Secondary | ICD-10-CM | POA: Diagnosis not present

## 2022-10-13 DIAGNOSIS — H900 Conductive hearing loss, bilateral: Secondary | ICD-10-CM | POA: Diagnosis not present

## 2022-10-13 DIAGNOSIS — K589 Irritable bowel syndrome without diarrhea: Secondary | ICD-10-CM | POA: Diagnosis not present

## 2022-10-13 DIAGNOSIS — K222 Esophageal obstruction: Secondary | ICD-10-CM | POA: Diagnosis not present

## 2022-10-13 DIAGNOSIS — G801 Spastic diplegic cerebral palsy: Secondary | ICD-10-CM | POA: Diagnosis not present

## 2022-10-13 DIAGNOSIS — Z95828 Presence of other vascular implants and grafts: Secondary | ICD-10-CM

## 2022-10-13 DIAGNOSIS — R1310 Dysphagia, unspecified: Secondary | ICD-10-CM | POA: Diagnosis not present

## 2022-10-13 DIAGNOSIS — G894 Chronic pain syndrome: Secondary | ICD-10-CM | POA: Diagnosis not present

## 2022-10-13 DIAGNOSIS — D709 Neutropenia, unspecified: Secondary | ICD-10-CM | POA: Diagnosis not present

## 2022-10-13 DIAGNOSIS — N319 Neuromuscular dysfunction of bladder, unspecified: Secondary | ICD-10-CM | POA: Diagnosis not present

## 2022-10-13 DIAGNOSIS — D696 Thrombocytopenia, unspecified: Secondary | ICD-10-CM | POA: Diagnosis not present

## 2022-10-13 DIAGNOSIS — D649 Anemia, unspecified: Secondary | ICD-10-CM | POA: Diagnosis not present

## 2022-10-13 DIAGNOSIS — R69 Illness, unspecified: Secondary | ICD-10-CM | POA: Diagnosis not present

## 2022-10-13 DIAGNOSIS — K592 Neurogenic bowel, not elsewhere classified: Secondary | ICD-10-CM | POA: Diagnosis not present

## 2022-10-13 LAB — CBC WITH DIFFERENTIAL (CANCER CENTER ONLY)
Abs Immature Granulocytes: 0.01 10*3/uL (ref 0.00–0.07)
Basophils Absolute: 0 10*3/uL (ref 0.0–0.1)
Basophils Relative: 1 %
Eosinophils Absolute: 0.2 10*3/uL (ref 0.0–0.5)
Eosinophils Relative: 4 %
HCT: 40.7 % (ref 36.0–46.0)
Hemoglobin: 13.3 g/dL (ref 12.0–15.0)
Immature Granulocytes: 0 %
Lymphocytes Relative: 34 %
Lymphs Abs: 1.5 10*3/uL (ref 0.7–4.0)
MCH: 29.6 pg (ref 26.0–34.0)
MCHC: 32.7 g/dL (ref 30.0–36.0)
MCV: 90.6 fL (ref 80.0–100.0)
Monocytes Absolute: 0.4 10*3/uL (ref 0.1–1.0)
Monocytes Relative: 8 %
Neutro Abs: 2.3 10*3/uL (ref 1.7–7.7)
Neutrophils Relative %: 53 %
Platelet Count: 137 10*3/uL — ABNORMAL LOW (ref 150–400)
RBC: 4.49 MIL/uL (ref 3.87–5.11)
RDW: 12.5 % (ref 11.5–15.5)
Smear Review: NORMAL
WBC Count: 4.3 10*3/uL (ref 4.0–10.5)
nRBC: 0 % (ref 0.0–0.2)

## 2022-10-13 MED ORDER — HEPARIN SOD (PORK) LOCK FLUSH 100 UNIT/ML IV SOLN
500.0000 [IU] | Freq: Once | INTRAVENOUS | Status: AC
  Administered 2022-10-13: 500 [IU] via INTRAVENOUS
  Filled 2022-10-13: qty 5

## 2022-10-13 MED ORDER — SODIUM CHLORIDE 0.9% FLUSH
10.0000 mL | Freq: Once | INTRAVENOUS | Status: AC
  Administered 2022-10-13: 10 mL via INTRAVENOUS
  Filled 2022-10-13: qty 10

## 2022-10-14 LAB — CANCER ANTIGEN 27.29: CA 27.29: 15.7 U/mL (ref 0.0–38.6)

## 2022-10-15 DIAGNOSIS — R262 Difficulty in walking, not elsewhere classified: Secondary | ICD-10-CM | POA: Diagnosis not present

## 2022-10-15 DIAGNOSIS — M6281 Muscle weakness (generalized): Secondary | ICD-10-CM | POA: Diagnosis not present

## 2022-10-15 DIAGNOSIS — L03116 Cellulitis of left lower limb: Secondary | ICD-10-CM | POA: Diagnosis not present

## 2022-10-18 DIAGNOSIS — K219 Gastro-esophageal reflux disease without esophagitis: Secondary | ICD-10-CM | POA: Diagnosis not present

## 2022-10-18 DIAGNOSIS — G801 Spastic diplegic cerebral palsy: Secondary | ICD-10-CM | POA: Diagnosis not present

## 2022-10-18 DIAGNOSIS — K8689 Other specified diseases of pancreas: Secondary | ICD-10-CM | POA: Diagnosis not present

## 2022-10-18 DIAGNOSIS — E119 Type 2 diabetes mellitus without complications: Secondary | ICD-10-CM | POA: Diagnosis not present

## 2022-10-18 DIAGNOSIS — Z981 Arthrodesis status: Secondary | ICD-10-CM | POA: Diagnosis not present

## 2022-10-18 DIAGNOSIS — K222 Esophageal obstruction: Secondary | ICD-10-CM | POA: Diagnosis not present

## 2022-10-18 DIAGNOSIS — I10 Essential (primary) hypertension: Secondary | ICD-10-CM | POA: Diagnosis not present

## 2022-10-18 DIAGNOSIS — D649 Anemia, unspecified: Secondary | ICD-10-CM | POA: Diagnosis not present

## 2022-10-18 DIAGNOSIS — J309 Allergic rhinitis, unspecified: Secondary | ICD-10-CM | POA: Diagnosis not present

## 2022-10-18 DIAGNOSIS — G894 Chronic pain syndrome: Secondary | ICD-10-CM | POA: Diagnosis not present

## 2022-10-18 DIAGNOSIS — I890 Lymphedema, not elsewhere classified: Secondary | ICD-10-CM | POA: Diagnosis not present

## 2022-10-18 DIAGNOSIS — R1310 Dysphagia, unspecified: Secondary | ICD-10-CM | POA: Diagnosis not present

## 2022-10-18 DIAGNOSIS — H900 Conductive hearing loss, bilateral: Secondary | ICD-10-CM | POA: Diagnosis not present

## 2022-10-18 DIAGNOSIS — N319 Neuromuscular dysfunction of bladder, unspecified: Secondary | ICD-10-CM | POA: Diagnosis not present

## 2022-10-18 DIAGNOSIS — M542 Cervicalgia: Secondary | ICD-10-CM | POA: Diagnosis not present

## 2022-10-18 DIAGNOSIS — D709 Neutropenia, unspecified: Secondary | ICD-10-CM | POA: Diagnosis not present

## 2022-10-18 DIAGNOSIS — D696 Thrombocytopenia, unspecified: Secondary | ICD-10-CM | POA: Diagnosis not present

## 2022-10-18 DIAGNOSIS — G35 Multiple sclerosis: Secondary | ICD-10-CM | POA: Diagnosis not present

## 2022-10-18 DIAGNOSIS — Z853 Personal history of malignant neoplasm of breast: Secondary | ICD-10-CM | POA: Diagnosis not present

## 2022-10-18 DIAGNOSIS — H6123 Impacted cerumen, bilateral: Secondary | ICD-10-CM | POA: Diagnosis not present

## 2022-10-18 DIAGNOSIS — K589 Irritable bowel syndrome without diarrhea: Secondary | ICD-10-CM | POA: Diagnosis not present

## 2022-10-18 DIAGNOSIS — K592 Neurogenic bowel, not elsewhere classified: Secondary | ICD-10-CM | POA: Diagnosis not present

## 2022-10-18 DIAGNOSIS — Z466 Encounter for fitting and adjustment of urinary device: Secondary | ICD-10-CM | POA: Diagnosis not present

## 2022-10-19 DIAGNOSIS — D696 Thrombocytopenia, unspecified: Secondary | ICD-10-CM | POA: Diagnosis not present

## 2022-10-19 DIAGNOSIS — H900 Conductive hearing loss, bilateral: Secondary | ICD-10-CM | POA: Diagnosis not present

## 2022-10-19 DIAGNOSIS — G801 Spastic diplegic cerebral palsy: Secondary | ICD-10-CM | POA: Diagnosis not present

## 2022-10-19 DIAGNOSIS — M542 Cervicalgia: Secondary | ICD-10-CM | POA: Diagnosis not present

## 2022-10-19 DIAGNOSIS — G35 Multiple sclerosis: Secondary | ICD-10-CM | POA: Diagnosis not present

## 2022-10-19 DIAGNOSIS — R1310 Dysphagia, unspecified: Secondary | ICD-10-CM | POA: Diagnosis not present

## 2022-10-19 DIAGNOSIS — J309 Allergic rhinitis, unspecified: Secondary | ICD-10-CM | POA: Diagnosis not present

## 2022-10-19 DIAGNOSIS — K8689 Other specified diseases of pancreas: Secondary | ICD-10-CM | POA: Diagnosis not present

## 2022-10-19 DIAGNOSIS — H6123 Impacted cerumen, bilateral: Secondary | ICD-10-CM | POA: Diagnosis not present

## 2022-10-19 DIAGNOSIS — D649 Anemia, unspecified: Secondary | ICD-10-CM | POA: Diagnosis not present

## 2022-10-19 DIAGNOSIS — G894 Chronic pain syndrome: Secondary | ICD-10-CM | POA: Diagnosis not present

## 2022-10-19 DIAGNOSIS — Z981 Arthrodesis status: Secondary | ICD-10-CM | POA: Diagnosis not present

## 2022-10-19 DIAGNOSIS — I10 Essential (primary) hypertension: Secondary | ICD-10-CM | POA: Diagnosis not present

## 2022-10-19 DIAGNOSIS — K589 Irritable bowel syndrome without diarrhea: Secondary | ICD-10-CM | POA: Diagnosis not present

## 2022-10-19 DIAGNOSIS — Z853 Personal history of malignant neoplasm of breast: Secondary | ICD-10-CM | POA: Diagnosis not present

## 2022-10-19 DIAGNOSIS — K219 Gastro-esophageal reflux disease without esophagitis: Secondary | ICD-10-CM | POA: Diagnosis not present

## 2022-10-19 DIAGNOSIS — N319 Neuromuscular dysfunction of bladder, unspecified: Secondary | ICD-10-CM | POA: Diagnosis not present

## 2022-10-19 DIAGNOSIS — E119 Type 2 diabetes mellitus without complications: Secondary | ICD-10-CM | POA: Diagnosis not present

## 2022-10-19 DIAGNOSIS — I890 Lymphedema, not elsewhere classified: Secondary | ICD-10-CM | POA: Diagnosis not present

## 2022-10-19 DIAGNOSIS — K592 Neurogenic bowel, not elsewhere classified: Secondary | ICD-10-CM | POA: Diagnosis not present

## 2022-10-19 DIAGNOSIS — K222 Esophageal obstruction: Secondary | ICD-10-CM | POA: Diagnosis not present

## 2022-10-19 DIAGNOSIS — D709 Neutropenia, unspecified: Secondary | ICD-10-CM | POA: Diagnosis not present

## 2022-10-19 DIAGNOSIS — Z466 Encounter for fitting and adjustment of urinary device: Secondary | ICD-10-CM | POA: Diagnosis not present

## 2022-10-22 DIAGNOSIS — K222 Esophageal obstruction: Secondary | ICD-10-CM | POA: Diagnosis not present

## 2022-10-22 DIAGNOSIS — D696 Thrombocytopenia, unspecified: Secondary | ICD-10-CM | POA: Diagnosis not present

## 2022-10-22 DIAGNOSIS — H900 Conductive hearing loss, bilateral: Secondary | ICD-10-CM | POA: Diagnosis not present

## 2022-10-22 DIAGNOSIS — K8689 Other specified diseases of pancreas: Secondary | ICD-10-CM | POA: Diagnosis not present

## 2022-10-22 DIAGNOSIS — G894 Chronic pain syndrome: Secondary | ICD-10-CM | POA: Diagnosis not present

## 2022-10-22 DIAGNOSIS — E119 Type 2 diabetes mellitus without complications: Secondary | ICD-10-CM | POA: Diagnosis not present

## 2022-10-22 DIAGNOSIS — K592 Neurogenic bowel, not elsewhere classified: Secondary | ICD-10-CM | POA: Diagnosis not present

## 2022-10-22 DIAGNOSIS — G35 Multiple sclerosis: Secondary | ICD-10-CM | POA: Diagnosis not present

## 2022-10-22 DIAGNOSIS — I10 Essential (primary) hypertension: Secondary | ICD-10-CM | POA: Diagnosis not present

## 2022-10-22 DIAGNOSIS — K589 Irritable bowel syndrome without diarrhea: Secondary | ICD-10-CM | POA: Diagnosis not present

## 2022-10-22 DIAGNOSIS — Z981 Arthrodesis status: Secondary | ICD-10-CM | POA: Diagnosis not present

## 2022-10-22 DIAGNOSIS — Z466 Encounter for fitting and adjustment of urinary device: Secondary | ICD-10-CM | POA: Diagnosis not present

## 2022-10-22 DIAGNOSIS — N319 Neuromuscular dysfunction of bladder, unspecified: Secondary | ICD-10-CM | POA: Diagnosis not present

## 2022-10-22 DIAGNOSIS — M542 Cervicalgia: Secondary | ICD-10-CM | POA: Diagnosis not present

## 2022-10-22 DIAGNOSIS — G801 Spastic diplegic cerebral palsy: Secondary | ICD-10-CM | POA: Diagnosis not present

## 2022-10-22 DIAGNOSIS — I890 Lymphedema, not elsewhere classified: Secondary | ICD-10-CM | POA: Diagnosis not present

## 2022-10-22 DIAGNOSIS — J309 Allergic rhinitis, unspecified: Secondary | ICD-10-CM | POA: Diagnosis not present

## 2022-10-22 DIAGNOSIS — K219 Gastro-esophageal reflux disease without esophagitis: Secondary | ICD-10-CM | POA: Diagnosis not present

## 2022-10-22 DIAGNOSIS — Z853 Personal history of malignant neoplasm of breast: Secondary | ICD-10-CM | POA: Diagnosis not present

## 2022-10-22 DIAGNOSIS — D709 Neutropenia, unspecified: Secondary | ICD-10-CM | POA: Diagnosis not present

## 2022-10-22 DIAGNOSIS — D649 Anemia, unspecified: Secondary | ICD-10-CM | POA: Diagnosis not present

## 2022-10-22 DIAGNOSIS — H6123 Impacted cerumen, bilateral: Secondary | ICD-10-CM | POA: Diagnosis not present

## 2022-10-22 DIAGNOSIS — R1310 Dysphagia, unspecified: Secondary | ICD-10-CM | POA: Diagnosis not present

## 2022-10-24 DIAGNOSIS — D696 Thrombocytopenia, unspecified: Secondary | ICD-10-CM | POA: Diagnosis not present

## 2022-10-24 DIAGNOSIS — K8689 Other specified diseases of pancreas: Secondary | ICD-10-CM | POA: Diagnosis not present

## 2022-10-24 DIAGNOSIS — I10 Essential (primary) hypertension: Secondary | ICD-10-CM | POA: Diagnosis not present

## 2022-10-24 DIAGNOSIS — R1310 Dysphagia, unspecified: Secondary | ICD-10-CM | POA: Diagnosis not present

## 2022-10-24 DIAGNOSIS — H6123 Impacted cerumen, bilateral: Secondary | ICD-10-CM | POA: Diagnosis not present

## 2022-10-24 DIAGNOSIS — G801 Spastic diplegic cerebral palsy: Secondary | ICD-10-CM | POA: Diagnosis not present

## 2022-10-24 DIAGNOSIS — E119 Type 2 diabetes mellitus without complications: Secondary | ICD-10-CM | POA: Diagnosis not present

## 2022-10-24 DIAGNOSIS — N319 Neuromuscular dysfunction of bladder, unspecified: Secondary | ICD-10-CM | POA: Diagnosis not present

## 2022-10-24 DIAGNOSIS — H900 Conductive hearing loss, bilateral: Secondary | ICD-10-CM | POA: Diagnosis not present

## 2022-10-24 DIAGNOSIS — G35 Multiple sclerosis: Secondary | ICD-10-CM | POA: Diagnosis not present

## 2022-10-24 DIAGNOSIS — I890 Lymphedema, not elsewhere classified: Secondary | ICD-10-CM | POA: Diagnosis not present

## 2022-10-24 DIAGNOSIS — Z981 Arthrodesis status: Secondary | ICD-10-CM | POA: Diagnosis not present

## 2022-10-24 DIAGNOSIS — D649 Anemia, unspecified: Secondary | ICD-10-CM | POA: Diagnosis not present

## 2022-10-24 DIAGNOSIS — Z466 Encounter for fitting and adjustment of urinary device: Secondary | ICD-10-CM | POA: Diagnosis not present

## 2022-10-24 DIAGNOSIS — D709 Neutropenia, unspecified: Secondary | ICD-10-CM | POA: Diagnosis not present

## 2022-10-24 DIAGNOSIS — M542 Cervicalgia: Secondary | ICD-10-CM | POA: Diagnosis not present

## 2022-10-24 DIAGNOSIS — K589 Irritable bowel syndrome without diarrhea: Secondary | ICD-10-CM | POA: Diagnosis not present

## 2022-10-24 DIAGNOSIS — G894 Chronic pain syndrome: Secondary | ICD-10-CM | POA: Diagnosis not present

## 2022-10-24 DIAGNOSIS — K592 Neurogenic bowel, not elsewhere classified: Secondary | ICD-10-CM | POA: Diagnosis not present

## 2022-10-24 DIAGNOSIS — K219 Gastro-esophageal reflux disease without esophagitis: Secondary | ICD-10-CM | POA: Diagnosis not present

## 2022-10-24 DIAGNOSIS — J309 Allergic rhinitis, unspecified: Secondary | ICD-10-CM | POA: Diagnosis not present

## 2022-10-24 DIAGNOSIS — Z853 Personal history of malignant neoplasm of breast: Secondary | ICD-10-CM | POA: Diagnosis not present

## 2022-10-24 DIAGNOSIS — K222 Esophageal obstruction: Secondary | ICD-10-CM | POA: Diagnosis not present

## 2022-10-26 DIAGNOSIS — D649 Anemia, unspecified: Secondary | ICD-10-CM | POA: Diagnosis not present

## 2022-10-26 DIAGNOSIS — J309 Allergic rhinitis, unspecified: Secondary | ICD-10-CM | POA: Diagnosis not present

## 2022-10-26 DIAGNOSIS — Z466 Encounter for fitting and adjustment of urinary device: Secondary | ICD-10-CM | POA: Diagnosis not present

## 2022-10-26 DIAGNOSIS — H900 Conductive hearing loss, bilateral: Secondary | ICD-10-CM | POA: Diagnosis not present

## 2022-10-26 DIAGNOSIS — I10 Essential (primary) hypertension: Secondary | ICD-10-CM | POA: Diagnosis not present

## 2022-10-26 DIAGNOSIS — H6123 Impacted cerumen, bilateral: Secondary | ICD-10-CM | POA: Diagnosis not present

## 2022-10-26 DIAGNOSIS — G35 Multiple sclerosis: Secondary | ICD-10-CM | POA: Diagnosis not present

## 2022-10-26 DIAGNOSIS — K589 Irritable bowel syndrome without diarrhea: Secondary | ICD-10-CM | POA: Diagnosis not present

## 2022-10-26 DIAGNOSIS — G894 Chronic pain syndrome: Secondary | ICD-10-CM | POA: Diagnosis not present

## 2022-10-26 DIAGNOSIS — K222 Esophageal obstruction: Secondary | ICD-10-CM | POA: Diagnosis not present

## 2022-10-26 DIAGNOSIS — K219 Gastro-esophageal reflux disease without esophagitis: Secondary | ICD-10-CM | POA: Diagnosis not present

## 2022-10-26 DIAGNOSIS — Z981 Arthrodesis status: Secondary | ICD-10-CM | POA: Diagnosis not present

## 2022-10-26 DIAGNOSIS — N319 Neuromuscular dysfunction of bladder, unspecified: Secondary | ICD-10-CM | POA: Diagnosis not present

## 2022-10-26 DIAGNOSIS — E119 Type 2 diabetes mellitus without complications: Secondary | ICD-10-CM | POA: Diagnosis not present

## 2022-10-26 DIAGNOSIS — D709 Neutropenia, unspecified: Secondary | ICD-10-CM | POA: Diagnosis not present

## 2022-10-26 DIAGNOSIS — I890 Lymphedema, not elsewhere classified: Secondary | ICD-10-CM | POA: Diagnosis not present

## 2022-10-26 DIAGNOSIS — K8689 Other specified diseases of pancreas: Secondary | ICD-10-CM | POA: Diagnosis not present

## 2022-10-26 DIAGNOSIS — R1310 Dysphagia, unspecified: Secondary | ICD-10-CM | POA: Diagnosis not present

## 2022-10-26 DIAGNOSIS — D696 Thrombocytopenia, unspecified: Secondary | ICD-10-CM | POA: Diagnosis not present

## 2022-10-26 DIAGNOSIS — M542 Cervicalgia: Secondary | ICD-10-CM | POA: Diagnosis not present

## 2022-10-26 DIAGNOSIS — Z853 Personal history of malignant neoplasm of breast: Secondary | ICD-10-CM | POA: Diagnosis not present

## 2022-10-26 DIAGNOSIS — G801 Spastic diplegic cerebral palsy: Secondary | ICD-10-CM | POA: Diagnosis not present

## 2022-10-26 DIAGNOSIS — K592 Neurogenic bowel, not elsewhere classified: Secondary | ICD-10-CM | POA: Diagnosis not present

## 2022-10-29 DIAGNOSIS — K8689 Other specified diseases of pancreas: Secondary | ICD-10-CM | POA: Diagnosis not present

## 2022-10-29 DIAGNOSIS — Z981 Arthrodesis status: Secondary | ICD-10-CM | POA: Diagnosis not present

## 2022-10-29 DIAGNOSIS — M542 Cervicalgia: Secondary | ICD-10-CM | POA: Diagnosis not present

## 2022-10-29 DIAGNOSIS — D696 Thrombocytopenia, unspecified: Secondary | ICD-10-CM | POA: Diagnosis not present

## 2022-10-29 DIAGNOSIS — Z853 Personal history of malignant neoplasm of breast: Secondary | ICD-10-CM | POA: Diagnosis not present

## 2022-10-29 DIAGNOSIS — N319 Neuromuscular dysfunction of bladder, unspecified: Secondary | ICD-10-CM | POA: Diagnosis not present

## 2022-10-29 DIAGNOSIS — K222 Esophageal obstruction: Secondary | ICD-10-CM | POA: Diagnosis not present

## 2022-10-29 DIAGNOSIS — H6123 Impacted cerumen, bilateral: Secondary | ICD-10-CM | POA: Diagnosis not present

## 2022-10-29 DIAGNOSIS — R1310 Dysphagia, unspecified: Secondary | ICD-10-CM | POA: Diagnosis not present

## 2022-10-29 DIAGNOSIS — K592 Neurogenic bowel, not elsewhere classified: Secondary | ICD-10-CM | POA: Diagnosis not present

## 2022-10-29 DIAGNOSIS — G801 Spastic diplegic cerebral palsy: Secondary | ICD-10-CM | POA: Diagnosis not present

## 2022-10-29 DIAGNOSIS — I10 Essential (primary) hypertension: Secondary | ICD-10-CM | POA: Diagnosis not present

## 2022-10-29 DIAGNOSIS — E119 Type 2 diabetes mellitus without complications: Secondary | ICD-10-CM | POA: Diagnosis not present

## 2022-10-29 DIAGNOSIS — G35 Multiple sclerosis: Secondary | ICD-10-CM | POA: Diagnosis not present

## 2022-10-29 DIAGNOSIS — J309 Allergic rhinitis, unspecified: Secondary | ICD-10-CM | POA: Diagnosis not present

## 2022-10-29 DIAGNOSIS — K589 Irritable bowel syndrome without diarrhea: Secondary | ICD-10-CM | POA: Diagnosis not present

## 2022-10-29 DIAGNOSIS — I890 Lymphedema, not elsewhere classified: Secondary | ICD-10-CM | POA: Diagnosis not present

## 2022-10-29 DIAGNOSIS — Z466 Encounter for fitting and adjustment of urinary device: Secondary | ICD-10-CM | POA: Diagnosis not present

## 2022-10-29 DIAGNOSIS — G894 Chronic pain syndrome: Secondary | ICD-10-CM | POA: Diagnosis not present

## 2022-10-29 DIAGNOSIS — D649 Anemia, unspecified: Secondary | ICD-10-CM | POA: Diagnosis not present

## 2022-10-29 DIAGNOSIS — K219 Gastro-esophageal reflux disease without esophagitis: Secondary | ICD-10-CM | POA: Diagnosis not present

## 2022-10-29 DIAGNOSIS — D709 Neutropenia, unspecified: Secondary | ICD-10-CM | POA: Diagnosis not present

## 2022-10-29 DIAGNOSIS — H900 Conductive hearing loss, bilateral: Secondary | ICD-10-CM | POA: Diagnosis not present

## 2022-10-30 ENCOUNTER — Other Ambulatory Visit: Payer: Self-pay | Admitting: Diagnostic Neuroimaging

## 2022-10-30 DIAGNOSIS — R030 Elevated blood-pressure reading, without diagnosis of hypertension: Secondary | ICD-10-CM

## 2022-10-30 DIAGNOSIS — G8220 Paraplegia, unspecified: Secondary | ICD-10-CM

## 2022-10-30 DIAGNOSIS — G35 Multiple sclerosis: Secondary | ICD-10-CM

## 2022-10-30 DIAGNOSIS — Z760 Encounter for issue of repeat prescription: Secondary | ICD-10-CM

## 2022-11-03 DIAGNOSIS — E119 Type 2 diabetes mellitus without complications: Secondary | ICD-10-CM | POA: Diagnosis not present

## 2022-11-03 DIAGNOSIS — Z466 Encounter for fitting and adjustment of urinary device: Secondary | ICD-10-CM | POA: Diagnosis not present

## 2022-11-03 DIAGNOSIS — K222 Esophageal obstruction: Secondary | ICD-10-CM | POA: Diagnosis not present

## 2022-11-03 DIAGNOSIS — H900 Conductive hearing loss, bilateral: Secondary | ICD-10-CM | POA: Diagnosis not present

## 2022-11-03 DIAGNOSIS — D696 Thrombocytopenia, unspecified: Secondary | ICD-10-CM | POA: Diagnosis not present

## 2022-11-03 DIAGNOSIS — D649 Anemia, unspecified: Secondary | ICD-10-CM | POA: Diagnosis not present

## 2022-11-03 DIAGNOSIS — G35 Multiple sclerosis: Secondary | ICD-10-CM | POA: Diagnosis not present

## 2022-11-03 DIAGNOSIS — K592 Neurogenic bowel, not elsewhere classified: Secondary | ICD-10-CM | POA: Diagnosis not present

## 2022-11-03 DIAGNOSIS — Z981 Arthrodesis status: Secondary | ICD-10-CM | POA: Diagnosis not present

## 2022-11-03 DIAGNOSIS — I890 Lymphedema, not elsewhere classified: Secondary | ICD-10-CM | POA: Diagnosis not present

## 2022-11-03 DIAGNOSIS — I10 Essential (primary) hypertension: Secondary | ICD-10-CM | POA: Diagnosis not present

## 2022-11-03 DIAGNOSIS — D709 Neutropenia, unspecified: Secondary | ICD-10-CM | POA: Diagnosis not present

## 2022-11-03 DIAGNOSIS — K219 Gastro-esophageal reflux disease without esophagitis: Secondary | ICD-10-CM | POA: Diagnosis not present

## 2022-11-03 DIAGNOSIS — K8689 Other specified diseases of pancreas: Secondary | ICD-10-CM | POA: Diagnosis not present

## 2022-11-03 DIAGNOSIS — G801 Spastic diplegic cerebral palsy: Secondary | ICD-10-CM | POA: Diagnosis not present

## 2022-11-03 DIAGNOSIS — Z853 Personal history of malignant neoplasm of breast: Secondary | ICD-10-CM | POA: Diagnosis not present

## 2022-11-03 DIAGNOSIS — M542 Cervicalgia: Secondary | ICD-10-CM | POA: Diagnosis not present

## 2022-11-03 DIAGNOSIS — R1310 Dysphagia, unspecified: Secondary | ICD-10-CM | POA: Diagnosis not present

## 2022-11-03 DIAGNOSIS — J309 Allergic rhinitis, unspecified: Secondary | ICD-10-CM | POA: Diagnosis not present

## 2022-11-03 DIAGNOSIS — K589 Irritable bowel syndrome without diarrhea: Secondary | ICD-10-CM | POA: Diagnosis not present

## 2022-11-03 DIAGNOSIS — G894 Chronic pain syndrome: Secondary | ICD-10-CM | POA: Diagnosis not present

## 2022-11-03 DIAGNOSIS — H6123 Impacted cerumen, bilateral: Secondary | ICD-10-CM | POA: Diagnosis not present

## 2022-11-03 DIAGNOSIS — N319 Neuromuscular dysfunction of bladder, unspecified: Secondary | ICD-10-CM | POA: Diagnosis not present

## 2022-11-05 DIAGNOSIS — G801 Spastic diplegic cerebral palsy: Secondary | ICD-10-CM | POA: Diagnosis not present

## 2022-11-05 DIAGNOSIS — G35 Multiple sclerosis: Secondary | ICD-10-CM | POA: Diagnosis not present

## 2022-11-05 DIAGNOSIS — D696 Thrombocytopenia, unspecified: Secondary | ICD-10-CM | POA: Diagnosis not present

## 2022-11-05 DIAGNOSIS — Z981 Arthrodesis status: Secondary | ICD-10-CM | POA: Diagnosis not present

## 2022-11-05 DIAGNOSIS — K592 Neurogenic bowel, not elsewhere classified: Secondary | ICD-10-CM | POA: Diagnosis not present

## 2022-11-05 DIAGNOSIS — R1310 Dysphagia, unspecified: Secondary | ICD-10-CM | POA: Diagnosis not present

## 2022-11-05 DIAGNOSIS — D709 Neutropenia, unspecified: Secondary | ICD-10-CM | POA: Diagnosis not present

## 2022-11-05 DIAGNOSIS — K589 Irritable bowel syndrome without diarrhea: Secondary | ICD-10-CM | POA: Diagnosis not present

## 2022-11-05 DIAGNOSIS — I10 Essential (primary) hypertension: Secondary | ICD-10-CM | POA: Diagnosis not present

## 2022-11-05 DIAGNOSIS — I890 Lymphedema, not elsewhere classified: Secondary | ICD-10-CM | POA: Diagnosis not present

## 2022-11-05 DIAGNOSIS — M542 Cervicalgia: Secondary | ICD-10-CM | POA: Diagnosis not present

## 2022-11-05 DIAGNOSIS — K8689 Other specified diseases of pancreas: Secondary | ICD-10-CM | POA: Diagnosis not present

## 2022-11-05 DIAGNOSIS — H900 Conductive hearing loss, bilateral: Secondary | ICD-10-CM | POA: Diagnosis not present

## 2022-11-05 DIAGNOSIS — Z466 Encounter for fitting and adjustment of urinary device: Secondary | ICD-10-CM | POA: Diagnosis not present

## 2022-11-05 DIAGNOSIS — K222 Esophageal obstruction: Secondary | ICD-10-CM | POA: Diagnosis not present

## 2022-11-05 DIAGNOSIS — J309 Allergic rhinitis, unspecified: Secondary | ICD-10-CM | POA: Diagnosis not present

## 2022-11-05 DIAGNOSIS — Z853 Personal history of malignant neoplasm of breast: Secondary | ICD-10-CM | POA: Diagnosis not present

## 2022-11-05 DIAGNOSIS — K219 Gastro-esophageal reflux disease without esophagitis: Secondary | ICD-10-CM | POA: Diagnosis not present

## 2022-11-05 DIAGNOSIS — E119 Type 2 diabetes mellitus without complications: Secondary | ICD-10-CM | POA: Diagnosis not present

## 2022-11-05 DIAGNOSIS — N319 Neuromuscular dysfunction of bladder, unspecified: Secondary | ICD-10-CM | POA: Diagnosis not present

## 2022-11-05 DIAGNOSIS — D649 Anemia, unspecified: Secondary | ICD-10-CM | POA: Diagnosis not present

## 2022-11-05 DIAGNOSIS — G894 Chronic pain syndrome: Secondary | ICD-10-CM | POA: Diagnosis not present

## 2022-11-05 DIAGNOSIS — H6123 Impacted cerumen, bilateral: Secondary | ICD-10-CM | POA: Diagnosis not present

## 2022-11-07 DIAGNOSIS — G801 Spastic diplegic cerebral palsy: Secondary | ICD-10-CM | POA: Diagnosis not present

## 2022-11-07 DIAGNOSIS — K8689 Other specified diseases of pancreas: Secondary | ICD-10-CM | POA: Diagnosis not present

## 2022-11-07 DIAGNOSIS — D696 Thrombocytopenia, unspecified: Secondary | ICD-10-CM | POA: Diagnosis not present

## 2022-11-07 DIAGNOSIS — K219 Gastro-esophageal reflux disease without esophagitis: Secondary | ICD-10-CM | POA: Diagnosis not present

## 2022-11-07 DIAGNOSIS — K592 Neurogenic bowel, not elsewhere classified: Secondary | ICD-10-CM | POA: Diagnosis not present

## 2022-11-07 DIAGNOSIS — H6123 Impacted cerumen, bilateral: Secondary | ICD-10-CM | POA: Diagnosis not present

## 2022-11-07 DIAGNOSIS — Z981 Arthrodesis status: Secondary | ICD-10-CM | POA: Diagnosis not present

## 2022-11-07 DIAGNOSIS — Z853 Personal history of malignant neoplasm of breast: Secondary | ICD-10-CM | POA: Diagnosis not present

## 2022-11-07 DIAGNOSIS — D709 Neutropenia, unspecified: Secondary | ICD-10-CM | POA: Diagnosis not present

## 2022-11-07 DIAGNOSIS — J309 Allergic rhinitis, unspecified: Secondary | ICD-10-CM | POA: Diagnosis not present

## 2022-11-07 DIAGNOSIS — M542 Cervicalgia: Secondary | ICD-10-CM | POA: Diagnosis not present

## 2022-11-07 DIAGNOSIS — R1310 Dysphagia, unspecified: Secondary | ICD-10-CM | POA: Diagnosis not present

## 2022-11-07 DIAGNOSIS — I10 Essential (primary) hypertension: Secondary | ICD-10-CM | POA: Diagnosis not present

## 2022-11-07 DIAGNOSIS — K589 Irritable bowel syndrome without diarrhea: Secondary | ICD-10-CM | POA: Diagnosis not present

## 2022-11-07 DIAGNOSIS — G894 Chronic pain syndrome: Secondary | ICD-10-CM | POA: Diagnosis not present

## 2022-11-07 DIAGNOSIS — E119 Type 2 diabetes mellitus without complications: Secondary | ICD-10-CM | POA: Diagnosis not present

## 2022-11-07 DIAGNOSIS — Z466 Encounter for fitting and adjustment of urinary device: Secondary | ICD-10-CM | POA: Diagnosis not present

## 2022-11-07 DIAGNOSIS — N319 Neuromuscular dysfunction of bladder, unspecified: Secondary | ICD-10-CM | POA: Diagnosis not present

## 2022-11-07 DIAGNOSIS — G35 Multiple sclerosis: Secondary | ICD-10-CM | POA: Diagnosis not present

## 2022-11-07 DIAGNOSIS — D649 Anemia, unspecified: Secondary | ICD-10-CM | POA: Diagnosis not present

## 2022-11-07 DIAGNOSIS — H900 Conductive hearing loss, bilateral: Secondary | ICD-10-CM | POA: Diagnosis not present

## 2022-11-07 DIAGNOSIS — K222 Esophageal obstruction: Secondary | ICD-10-CM | POA: Diagnosis not present

## 2022-11-07 DIAGNOSIS — I890 Lymphedema, not elsewhere classified: Secondary | ICD-10-CM | POA: Diagnosis not present

## 2022-11-12 DIAGNOSIS — J309 Allergic rhinitis, unspecified: Secondary | ICD-10-CM | POA: Diagnosis not present

## 2022-11-12 DIAGNOSIS — G801 Spastic diplegic cerebral palsy: Secondary | ICD-10-CM | POA: Diagnosis not present

## 2022-11-12 DIAGNOSIS — D696 Thrombocytopenia, unspecified: Secondary | ICD-10-CM | POA: Diagnosis not present

## 2022-11-12 DIAGNOSIS — I10 Essential (primary) hypertension: Secondary | ICD-10-CM | POA: Diagnosis not present

## 2022-11-12 DIAGNOSIS — Z466 Encounter for fitting and adjustment of urinary device: Secondary | ICD-10-CM | POA: Diagnosis not present

## 2022-11-12 DIAGNOSIS — R1310 Dysphagia, unspecified: Secondary | ICD-10-CM | POA: Diagnosis not present

## 2022-11-12 DIAGNOSIS — D649 Anemia, unspecified: Secondary | ICD-10-CM | POA: Diagnosis not present

## 2022-11-12 DIAGNOSIS — H900 Conductive hearing loss, bilateral: Secondary | ICD-10-CM | POA: Diagnosis not present

## 2022-11-12 DIAGNOSIS — Z981 Arthrodesis status: Secondary | ICD-10-CM | POA: Diagnosis not present

## 2022-11-12 DIAGNOSIS — Z853 Personal history of malignant neoplasm of breast: Secondary | ICD-10-CM | POA: Diagnosis not present

## 2022-11-12 DIAGNOSIS — I890 Lymphedema, not elsewhere classified: Secondary | ICD-10-CM | POA: Diagnosis not present

## 2022-11-12 DIAGNOSIS — K589 Irritable bowel syndrome without diarrhea: Secondary | ICD-10-CM | POA: Diagnosis not present

## 2022-11-12 DIAGNOSIS — D709 Neutropenia, unspecified: Secondary | ICD-10-CM | POA: Diagnosis not present

## 2022-11-12 DIAGNOSIS — K8689 Other specified diseases of pancreas: Secondary | ICD-10-CM | POA: Diagnosis not present

## 2022-11-12 DIAGNOSIS — K222 Esophageal obstruction: Secondary | ICD-10-CM | POA: Diagnosis not present

## 2022-11-12 DIAGNOSIS — N319 Neuromuscular dysfunction of bladder, unspecified: Secondary | ICD-10-CM | POA: Diagnosis not present

## 2022-11-12 DIAGNOSIS — K219 Gastro-esophageal reflux disease without esophagitis: Secondary | ICD-10-CM | POA: Diagnosis not present

## 2022-11-12 DIAGNOSIS — G894 Chronic pain syndrome: Secondary | ICD-10-CM | POA: Diagnosis not present

## 2022-11-12 DIAGNOSIS — K592 Neurogenic bowel, not elsewhere classified: Secondary | ICD-10-CM | POA: Diagnosis not present

## 2022-11-12 DIAGNOSIS — M542 Cervicalgia: Secondary | ICD-10-CM | POA: Diagnosis not present

## 2022-11-12 DIAGNOSIS — G35 Multiple sclerosis: Secondary | ICD-10-CM | POA: Diagnosis not present

## 2022-11-12 DIAGNOSIS — E119 Type 2 diabetes mellitus without complications: Secondary | ICD-10-CM | POA: Diagnosis not present

## 2022-11-12 DIAGNOSIS — H6123 Impacted cerumen, bilateral: Secondary | ICD-10-CM | POA: Diagnosis not present

## 2022-11-14 DIAGNOSIS — M6281 Muscle weakness (generalized): Secondary | ICD-10-CM | POA: Diagnosis not present

## 2022-11-14 DIAGNOSIS — R262 Difficulty in walking, not elsewhere classified: Secondary | ICD-10-CM | POA: Diagnosis not present

## 2022-11-14 DIAGNOSIS — L03116 Cellulitis of left lower limb: Secondary | ICD-10-CM | POA: Diagnosis not present

## 2022-11-16 DIAGNOSIS — I890 Lymphedema, not elsewhere classified: Secondary | ICD-10-CM | POA: Diagnosis not present

## 2022-11-16 DIAGNOSIS — D709 Neutropenia, unspecified: Secondary | ICD-10-CM | POA: Diagnosis not present

## 2022-11-16 DIAGNOSIS — H900 Conductive hearing loss, bilateral: Secondary | ICD-10-CM | POA: Diagnosis not present

## 2022-11-16 DIAGNOSIS — D696 Thrombocytopenia, unspecified: Secondary | ICD-10-CM | POA: Diagnosis not present

## 2022-11-16 DIAGNOSIS — I10 Essential (primary) hypertension: Secondary | ICD-10-CM | POA: Diagnosis not present

## 2022-11-16 DIAGNOSIS — Z981 Arthrodesis status: Secondary | ICD-10-CM | POA: Diagnosis not present

## 2022-11-16 DIAGNOSIS — H6123 Impacted cerumen, bilateral: Secondary | ICD-10-CM | POA: Diagnosis not present

## 2022-11-16 DIAGNOSIS — N319 Neuromuscular dysfunction of bladder, unspecified: Secondary | ICD-10-CM | POA: Diagnosis not present

## 2022-11-16 DIAGNOSIS — Z466 Encounter for fitting and adjustment of urinary device: Secondary | ICD-10-CM | POA: Diagnosis not present

## 2022-11-16 DIAGNOSIS — G35 Multiple sclerosis: Secondary | ICD-10-CM | POA: Diagnosis not present

## 2022-11-16 DIAGNOSIS — J309 Allergic rhinitis, unspecified: Secondary | ICD-10-CM | POA: Diagnosis not present

## 2022-11-16 DIAGNOSIS — M542 Cervicalgia: Secondary | ICD-10-CM | POA: Diagnosis not present

## 2022-11-16 DIAGNOSIS — E119 Type 2 diabetes mellitus without complications: Secondary | ICD-10-CM | POA: Diagnosis not present

## 2022-11-16 DIAGNOSIS — K8689 Other specified diseases of pancreas: Secondary | ICD-10-CM | POA: Diagnosis not present

## 2022-11-16 DIAGNOSIS — K589 Irritable bowel syndrome without diarrhea: Secondary | ICD-10-CM | POA: Diagnosis not present

## 2022-11-16 DIAGNOSIS — K592 Neurogenic bowel, not elsewhere classified: Secondary | ICD-10-CM | POA: Diagnosis not present

## 2022-11-16 DIAGNOSIS — Z853 Personal history of malignant neoplasm of breast: Secondary | ICD-10-CM | POA: Diagnosis not present

## 2022-11-16 DIAGNOSIS — R1310 Dysphagia, unspecified: Secondary | ICD-10-CM | POA: Diagnosis not present

## 2022-11-16 DIAGNOSIS — G801 Spastic diplegic cerebral palsy: Secondary | ICD-10-CM | POA: Diagnosis not present

## 2022-11-16 DIAGNOSIS — K222 Esophageal obstruction: Secondary | ICD-10-CM | POA: Diagnosis not present

## 2022-11-16 DIAGNOSIS — D649 Anemia, unspecified: Secondary | ICD-10-CM | POA: Diagnosis not present

## 2022-11-16 DIAGNOSIS — G894 Chronic pain syndrome: Secondary | ICD-10-CM | POA: Diagnosis not present

## 2022-11-16 DIAGNOSIS — K219 Gastro-esophageal reflux disease without esophagitis: Secondary | ICD-10-CM | POA: Diagnosis not present

## 2022-11-17 ENCOUNTER — Telehealth: Payer: Self-pay

## 2022-11-17 DIAGNOSIS — E119 Type 2 diabetes mellitus without complications: Secondary | ICD-10-CM | POA: Diagnosis not present

## 2022-11-17 DIAGNOSIS — J309 Allergic rhinitis, unspecified: Secondary | ICD-10-CM | POA: Diagnosis not present

## 2022-11-17 DIAGNOSIS — Z981 Arthrodesis status: Secondary | ICD-10-CM | POA: Diagnosis not present

## 2022-11-17 DIAGNOSIS — N319 Neuromuscular dysfunction of bladder, unspecified: Secondary | ICD-10-CM | POA: Diagnosis not present

## 2022-11-17 DIAGNOSIS — K592 Neurogenic bowel, not elsewhere classified: Secondary | ICD-10-CM | POA: Diagnosis not present

## 2022-11-17 DIAGNOSIS — Z853 Personal history of malignant neoplasm of breast: Secondary | ICD-10-CM | POA: Diagnosis not present

## 2022-11-17 DIAGNOSIS — K222 Esophageal obstruction: Secondary | ICD-10-CM | POA: Diagnosis not present

## 2022-11-17 DIAGNOSIS — K589 Irritable bowel syndrome without diarrhea: Secondary | ICD-10-CM | POA: Diagnosis not present

## 2022-11-17 DIAGNOSIS — R1310 Dysphagia, unspecified: Secondary | ICD-10-CM | POA: Diagnosis not present

## 2022-11-17 DIAGNOSIS — K8689 Other specified diseases of pancreas: Secondary | ICD-10-CM | POA: Diagnosis not present

## 2022-11-17 DIAGNOSIS — I10 Essential (primary) hypertension: Secondary | ICD-10-CM | POA: Diagnosis not present

## 2022-11-17 DIAGNOSIS — D649 Anemia, unspecified: Secondary | ICD-10-CM | POA: Diagnosis not present

## 2022-11-17 DIAGNOSIS — G35 Multiple sclerosis: Secondary | ICD-10-CM | POA: Diagnosis not present

## 2022-11-17 DIAGNOSIS — I890 Lymphedema, not elsewhere classified: Secondary | ICD-10-CM | POA: Diagnosis not present

## 2022-11-17 DIAGNOSIS — H6123 Impacted cerumen, bilateral: Secondary | ICD-10-CM | POA: Diagnosis not present

## 2022-11-17 DIAGNOSIS — K219 Gastro-esophageal reflux disease without esophagitis: Secondary | ICD-10-CM | POA: Diagnosis not present

## 2022-11-17 DIAGNOSIS — G801 Spastic diplegic cerebral palsy: Secondary | ICD-10-CM | POA: Diagnosis not present

## 2022-11-17 DIAGNOSIS — Z466 Encounter for fitting and adjustment of urinary device: Secondary | ICD-10-CM | POA: Diagnosis not present

## 2022-11-17 DIAGNOSIS — H900 Conductive hearing loss, bilateral: Secondary | ICD-10-CM | POA: Diagnosis not present

## 2022-11-17 DIAGNOSIS — D709 Neutropenia, unspecified: Secondary | ICD-10-CM | POA: Diagnosis not present

## 2022-11-17 DIAGNOSIS — G894 Chronic pain syndrome: Secondary | ICD-10-CM | POA: Diagnosis not present

## 2022-11-17 DIAGNOSIS — M542 Cervicalgia: Secondary | ICD-10-CM | POA: Diagnosis not present

## 2022-11-17 DIAGNOSIS — D696 Thrombocytopenia, unspecified: Secondary | ICD-10-CM | POA: Diagnosis not present

## 2022-11-17 NOTE — Telephone Encounter (Signed)
Patient left message on triage line stating that she and her [facility] nurse noticed the last couple of days that her urine looks darker yellow color in there catheter bag and has bad odor. No other symptoms at this time. Patient wonders if she has a UTI. She stated that her [facility] nurse can get UA sample if we order or she can try and come to the office by EMS if needed.

## 2022-11-18 NOTE — Telephone Encounter (Signed)
.  left message to have patient return my call.  

## 2022-11-18 NOTE — Telephone Encounter (Signed)
Pt changed her bag and started pushing more water, pt denies any fever, pain or change in mental. Pt states her urine looks better

## 2022-11-22 DIAGNOSIS — R1310 Dysphagia, unspecified: Secondary | ICD-10-CM | POA: Diagnosis not present

## 2022-11-22 DIAGNOSIS — K589 Irritable bowel syndrome without diarrhea: Secondary | ICD-10-CM | POA: Diagnosis not present

## 2022-11-22 DIAGNOSIS — G801 Spastic diplegic cerebral palsy: Secondary | ICD-10-CM | POA: Diagnosis not present

## 2022-11-22 DIAGNOSIS — J309 Allergic rhinitis, unspecified: Secondary | ICD-10-CM | POA: Diagnosis not present

## 2022-11-22 DIAGNOSIS — D696 Thrombocytopenia, unspecified: Secondary | ICD-10-CM | POA: Diagnosis not present

## 2022-11-22 DIAGNOSIS — Z981 Arthrodesis status: Secondary | ICD-10-CM | POA: Diagnosis not present

## 2022-11-22 DIAGNOSIS — G35 Multiple sclerosis: Secondary | ICD-10-CM | POA: Diagnosis not present

## 2022-11-22 DIAGNOSIS — Z853 Personal history of malignant neoplasm of breast: Secondary | ICD-10-CM | POA: Diagnosis not present

## 2022-11-22 DIAGNOSIS — I10 Essential (primary) hypertension: Secondary | ICD-10-CM | POA: Diagnosis not present

## 2022-11-22 DIAGNOSIS — K219 Gastro-esophageal reflux disease without esophagitis: Secondary | ICD-10-CM | POA: Diagnosis not present

## 2022-11-22 DIAGNOSIS — K222 Esophageal obstruction: Secondary | ICD-10-CM | POA: Diagnosis not present

## 2022-11-22 DIAGNOSIS — H900 Conductive hearing loss, bilateral: Secondary | ICD-10-CM | POA: Diagnosis not present

## 2022-11-22 DIAGNOSIS — D649 Anemia, unspecified: Secondary | ICD-10-CM | POA: Diagnosis not present

## 2022-11-22 DIAGNOSIS — Z466 Encounter for fitting and adjustment of urinary device: Secondary | ICD-10-CM | POA: Diagnosis not present

## 2022-11-22 DIAGNOSIS — H6123 Impacted cerumen, bilateral: Secondary | ICD-10-CM | POA: Diagnosis not present

## 2022-11-22 DIAGNOSIS — M542 Cervicalgia: Secondary | ICD-10-CM | POA: Diagnosis not present

## 2022-11-22 DIAGNOSIS — N319 Neuromuscular dysfunction of bladder, unspecified: Secondary | ICD-10-CM | POA: Diagnosis not present

## 2022-11-22 DIAGNOSIS — E119 Type 2 diabetes mellitus without complications: Secondary | ICD-10-CM | POA: Diagnosis not present

## 2022-11-22 DIAGNOSIS — G894 Chronic pain syndrome: Secondary | ICD-10-CM | POA: Diagnosis not present

## 2022-11-22 DIAGNOSIS — D709 Neutropenia, unspecified: Secondary | ICD-10-CM | POA: Diagnosis not present

## 2022-11-22 DIAGNOSIS — I890 Lymphedema, not elsewhere classified: Secondary | ICD-10-CM | POA: Diagnosis not present

## 2022-11-22 DIAGNOSIS — K8689 Other specified diseases of pancreas: Secondary | ICD-10-CM | POA: Diagnosis not present

## 2022-11-22 DIAGNOSIS — K592 Neurogenic bowel, not elsewhere classified: Secondary | ICD-10-CM | POA: Diagnosis not present

## 2022-11-23 DIAGNOSIS — K592 Neurogenic bowel, not elsewhere classified: Secondary | ICD-10-CM | POA: Diagnosis not present

## 2022-11-23 DIAGNOSIS — K222 Esophageal obstruction: Secondary | ICD-10-CM | POA: Diagnosis not present

## 2022-11-23 DIAGNOSIS — D696 Thrombocytopenia, unspecified: Secondary | ICD-10-CM | POA: Diagnosis not present

## 2022-11-23 DIAGNOSIS — D649 Anemia, unspecified: Secondary | ICD-10-CM | POA: Diagnosis not present

## 2022-11-23 DIAGNOSIS — Z466 Encounter for fitting and adjustment of urinary device: Secondary | ICD-10-CM | POA: Diagnosis not present

## 2022-11-23 DIAGNOSIS — M542 Cervicalgia: Secondary | ICD-10-CM | POA: Diagnosis not present

## 2022-11-23 DIAGNOSIS — K219 Gastro-esophageal reflux disease without esophagitis: Secondary | ICD-10-CM | POA: Diagnosis not present

## 2022-11-23 DIAGNOSIS — Z853 Personal history of malignant neoplasm of breast: Secondary | ICD-10-CM | POA: Diagnosis not present

## 2022-11-23 DIAGNOSIS — G801 Spastic diplegic cerebral palsy: Secondary | ICD-10-CM | POA: Diagnosis not present

## 2022-11-23 DIAGNOSIS — G894 Chronic pain syndrome: Secondary | ICD-10-CM | POA: Diagnosis not present

## 2022-11-23 DIAGNOSIS — E119 Type 2 diabetes mellitus without complications: Secondary | ICD-10-CM | POA: Diagnosis not present

## 2022-11-23 DIAGNOSIS — I890 Lymphedema, not elsewhere classified: Secondary | ICD-10-CM | POA: Diagnosis not present

## 2022-11-23 DIAGNOSIS — I10 Essential (primary) hypertension: Secondary | ICD-10-CM | POA: Diagnosis not present

## 2022-11-23 DIAGNOSIS — J309 Allergic rhinitis, unspecified: Secondary | ICD-10-CM | POA: Diagnosis not present

## 2022-11-23 DIAGNOSIS — H6123 Impacted cerumen, bilateral: Secondary | ICD-10-CM | POA: Diagnosis not present

## 2022-11-23 DIAGNOSIS — Z981 Arthrodesis status: Secondary | ICD-10-CM | POA: Diagnosis not present

## 2022-11-23 DIAGNOSIS — G35 Multiple sclerosis: Secondary | ICD-10-CM | POA: Diagnosis not present

## 2022-11-23 DIAGNOSIS — R1310 Dysphagia, unspecified: Secondary | ICD-10-CM | POA: Diagnosis not present

## 2022-11-23 DIAGNOSIS — K8689 Other specified diseases of pancreas: Secondary | ICD-10-CM | POA: Diagnosis not present

## 2022-11-23 DIAGNOSIS — N319 Neuromuscular dysfunction of bladder, unspecified: Secondary | ICD-10-CM | POA: Diagnosis not present

## 2022-11-23 DIAGNOSIS — K589 Irritable bowel syndrome without diarrhea: Secondary | ICD-10-CM | POA: Diagnosis not present

## 2022-11-23 DIAGNOSIS — D709 Neutropenia, unspecified: Secondary | ICD-10-CM | POA: Diagnosis not present

## 2022-11-23 DIAGNOSIS — H900 Conductive hearing loss, bilateral: Secondary | ICD-10-CM | POA: Diagnosis not present

## 2022-11-24 DIAGNOSIS — D696 Thrombocytopenia, unspecified: Secondary | ICD-10-CM | POA: Diagnosis not present

## 2022-11-24 DIAGNOSIS — K222 Esophageal obstruction: Secondary | ICD-10-CM | POA: Diagnosis not present

## 2022-11-24 DIAGNOSIS — I890 Lymphedema, not elsewhere classified: Secondary | ICD-10-CM | POA: Diagnosis not present

## 2022-11-24 DIAGNOSIS — D649 Anemia, unspecified: Secondary | ICD-10-CM | POA: Diagnosis not present

## 2022-11-24 DIAGNOSIS — G894 Chronic pain syndrome: Secondary | ICD-10-CM | POA: Diagnosis not present

## 2022-11-24 DIAGNOSIS — I10 Essential (primary) hypertension: Secondary | ICD-10-CM | POA: Diagnosis not present

## 2022-11-24 DIAGNOSIS — K8689 Other specified diseases of pancreas: Secondary | ICD-10-CM | POA: Diagnosis not present

## 2022-11-24 DIAGNOSIS — N319 Neuromuscular dysfunction of bladder, unspecified: Secondary | ICD-10-CM | POA: Diagnosis not present

## 2022-11-24 DIAGNOSIS — Z853 Personal history of malignant neoplasm of breast: Secondary | ICD-10-CM | POA: Diagnosis not present

## 2022-11-24 DIAGNOSIS — G35 Multiple sclerosis: Secondary | ICD-10-CM | POA: Diagnosis not present

## 2022-11-24 DIAGNOSIS — H900 Conductive hearing loss, bilateral: Secondary | ICD-10-CM | POA: Diagnosis not present

## 2022-11-24 DIAGNOSIS — R1310 Dysphagia, unspecified: Secondary | ICD-10-CM | POA: Diagnosis not present

## 2022-11-24 DIAGNOSIS — M542 Cervicalgia: Secondary | ICD-10-CM | POA: Diagnosis not present

## 2022-11-24 DIAGNOSIS — H6123 Impacted cerumen, bilateral: Secondary | ICD-10-CM | POA: Diagnosis not present

## 2022-11-24 DIAGNOSIS — Z981 Arthrodesis status: Secondary | ICD-10-CM | POA: Diagnosis not present

## 2022-11-24 DIAGNOSIS — J309 Allergic rhinitis, unspecified: Secondary | ICD-10-CM | POA: Diagnosis not present

## 2022-11-24 DIAGNOSIS — K589 Irritable bowel syndrome without diarrhea: Secondary | ICD-10-CM | POA: Diagnosis not present

## 2022-11-24 DIAGNOSIS — G801 Spastic diplegic cerebral palsy: Secondary | ICD-10-CM | POA: Diagnosis not present

## 2022-11-24 DIAGNOSIS — K219 Gastro-esophageal reflux disease without esophagitis: Secondary | ICD-10-CM | POA: Diagnosis not present

## 2022-11-24 DIAGNOSIS — K592 Neurogenic bowel, not elsewhere classified: Secondary | ICD-10-CM | POA: Diagnosis not present

## 2022-11-24 DIAGNOSIS — E119 Type 2 diabetes mellitus without complications: Secondary | ICD-10-CM | POA: Diagnosis not present

## 2022-11-24 DIAGNOSIS — Z466 Encounter for fitting and adjustment of urinary device: Secondary | ICD-10-CM | POA: Diagnosis not present

## 2022-11-24 DIAGNOSIS — D709 Neutropenia, unspecified: Secondary | ICD-10-CM | POA: Diagnosis not present

## 2022-11-25 ENCOUNTER — Telehealth: Payer: Self-pay | Admitting: Family Medicine

## 2022-11-25 ENCOUNTER — Inpatient Hospital Stay: Payer: 59

## 2022-11-25 DIAGNOSIS — K592 Neurogenic bowel, not elsewhere classified: Secondary | ICD-10-CM | POA: Diagnosis not present

## 2022-11-25 DIAGNOSIS — K589 Irritable bowel syndrome without diarrhea: Secondary | ICD-10-CM | POA: Diagnosis not present

## 2022-11-25 DIAGNOSIS — G801 Spastic diplegic cerebral palsy: Secondary | ICD-10-CM | POA: Diagnosis not present

## 2022-11-25 DIAGNOSIS — Z981 Arthrodesis status: Secondary | ICD-10-CM | POA: Diagnosis not present

## 2022-11-25 DIAGNOSIS — M542 Cervicalgia: Secondary | ICD-10-CM | POA: Diagnosis not present

## 2022-11-25 DIAGNOSIS — H6123 Impacted cerumen, bilateral: Secondary | ICD-10-CM | POA: Diagnosis not present

## 2022-11-25 DIAGNOSIS — K222 Esophageal obstruction: Secondary | ICD-10-CM | POA: Diagnosis not present

## 2022-11-25 DIAGNOSIS — I890 Lymphedema, not elsewhere classified: Secondary | ICD-10-CM | POA: Diagnosis not present

## 2022-11-25 DIAGNOSIS — J309 Allergic rhinitis, unspecified: Secondary | ICD-10-CM | POA: Diagnosis not present

## 2022-11-25 DIAGNOSIS — K219 Gastro-esophageal reflux disease without esophagitis: Secondary | ICD-10-CM | POA: Diagnosis not present

## 2022-11-25 DIAGNOSIS — Z466 Encounter for fitting and adjustment of urinary device: Secondary | ICD-10-CM | POA: Diagnosis not present

## 2022-11-25 DIAGNOSIS — E119 Type 2 diabetes mellitus without complications: Secondary | ICD-10-CM | POA: Diagnosis not present

## 2022-11-25 DIAGNOSIS — G35 Multiple sclerosis: Secondary | ICD-10-CM | POA: Diagnosis not present

## 2022-11-25 DIAGNOSIS — N319 Neuromuscular dysfunction of bladder, unspecified: Secondary | ICD-10-CM | POA: Diagnosis not present

## 2022-11-25 DIAGNOSIS — D696 Thrombocytopenia, unspecified: Secondary | ICD-10-CM | POA: Diagnosis not present

## 2022-11-25 DIAGNOSIS — D649 Anemia, unspecified: Secondary | ICD-10-CM | POA: Diagnosis not present

## 2022-11-25 DIAGNOSIS — K8689 Other specified diseases of pancreas: Secondary | ICD-10-CM | POA: Diagnosis not present

## 2022-11-25 DIAGNOSIS — H900 Conductive hearing loss, bilateral: Secondary | ICD-10-CM | POA: Diagnosis not present

## 2022-11-25 DIAGNOSIS — R1310 Dysphagia, unspecified: Secondary | ICD-10-CM | POA: Diagnosis not present

## 2022-11-25 DIAGNOSIS — Z853 Personal history of malignant neoplasm of breast: Secondary | ICD-10-CM | POA: Diagnosis not present

## 2022-11-25 DIAGNOSIS — D709 Neutropenia, unspecified: Secondary | ICD-10-CM | POA: Diagnosis not present

## 2022-11-25 DIAGNOSIS — G894 Chronic pain syndrome: Secondary | ICD-10-CM | POA: Diagnosis not present

## 2022-11-25 DIAGNOSIS — I10 Essential (primary) hypertension: Secondary | ICD-10-CM | POA: Diagnosis not present

## 2022-11-25 NOTE — Telephone Encounter (Unsigned)
Copied from CRM [id number]. Topic: Quick Communication - Home Health Verbal Orders >> Nov 25, 2022  4:12 PM Clide Dales wrote: Caller/Agency: Thu/[facility] Callback Number: [phone] Requesting OT/PT/Skilled Nursing/Social Work/Speech Therapy: PT Frequency: 1w9

## 2022-11-26 DIAGNOSIS — G894 Chronic pain syndrome: Secondary | ICD-10-CM | POA: Diagnosis not present

## 2022-11-26 DIAGNOSIS — Z853 Personal history of malignant neoplasm of breast: Secondary | ICD-10-CM | POA: Diagnosis not present

## 2022-11-26 DIAGNOSIS — K8689 Other specified diseases of pancreas: Secondary | ICD-10-CM | POA: Diagnosis not present

## 2022-11-26 DIAGNOSIS — M542 Cervicalgia: Secondary | ICD-10-CM | POA: Diagnosis not present

## 2022-11-26 DIAGNOSIS — H900 Conductive hearing loss, bilateral: Secondary | ICD-10-CM | POA: Diagnosis not present

## 2022-11-26 DIAGNOSIS — D709 Neutropenia, unspecified: Secondary | ICD-10-CM | POA: Diagnosis not present

## 2022-11-26 DIAGNOSIS — E119 Type 2 diabetes mellitus without complications: Secondary | ICD-10-CM | POA: Diagnosis not present

## 2022-11-26 DIAGNOSIS — N319 Neuromuscular dysfunction of bladder, unspecified: Secondary | ICD-10-CM | POA: Diagnosis not present

## 2022-11-26 DIAGNOSIS — G35 Multiple sclerosis: Secondary | ICD-10-CM | POA: Diagnosis not present

## 2022-11-26 DIAGNOSIS — J309 Allergic rhinitis, unspecified: Secondary | ICD-10-CM | POA: Diagnosis not present

## 2022-11-26 DIAGNOSIS — I10 Essential (primary) hypertension: Secondary | ICD-10-CM | POA: Diagnosis not present

## 2022-11-26 DIAGNOSIS — Z466 Encounter for fitting and adjustment of urinary device: Secondary | ICD-10-CM | POA: Diagnosis not present

## 2022-11-26 DIAGNOSIS — H6123 Impacted cerumen, bilateral: Secondary | ICD-10-CM | POA: Diagnosis not present

## 2022-11-26 DIAGNOSIS — K222 Esophageal obstruction: Secondary | ICD-10-CM | POA: Diagnosis not present

## 2022-11-26 DIAGNOSIS — G801 Spastic diplegic cerebral palsy: Secondary | ICD-10-CM | POA: Diagnosis not present

## 2022-11-26 DIAGNOSIS — K219 Gastro-esophageal reflux disease without esophagitis: Secondary | ICD-10-CM | POA: Diagnosis not present

## 2022-11-26 DIAGNOSIS — R1310 Dysphagia, unspecified: Secondary | ICD-10-CM | POA: Diagnosis not present

## 2022-11-26 DIAGNOSIS — D649 Anemia, unspecified: Secondary | ICD-10-CM | POA: Diagnosis not present

## 2022-11-26 DIAGNOSIS — I890 Lymphedema, not elsewhere classified: Secondary | ICD-10-CM | POA: Diagnosis not present

## 2022-11-26 DIAGNOSIS — K589 Irritable bowel syndrome without diarrhea: Secondary | ICD-10-CM | POA: Diagnosis not present

## 2022-11-26 DIAGNOSIS — K592 Neurogenic bowel, not elsewhere classified: Secondary | ICD-10-CM | POA: Diagnosis not present

## 2022-11-26 DIAGNOSIS — D696 Thrombocytopenia, unspecified: Secondary | ICD-10-CM | POA: Diagnosis not present

## 2022-11-26 DIAGNOSIS — Z981 Arthrodesis status: Secondary | ICD-10-CM | POA: Diagnosis not present

## 2022-11-28 ENCOUNTER — Telehealth: Payer: Self-pay | Admitting: Family Medicine

## 2022-11-28 DIAGNOSIS — D696 Thrombocytopenia, unspecified: Secondary | ICD-10-CM | POA: Diagnosis not present

## 2022-11-28 DIAGNOSIS — K592 Neurogenic bowel, not elsewhere classified: Secondary | ICD-10-CM | POA: Diagnosis not present

## 2022-11-28 DIAGNOSIS — M542 Cervicalgia: Secondary | ICD-10-CM | POA: Diagnosis not present

## 2022-11-28 DIAGNOSIS — K8689 Other specified diseases of pancreas: Secondary | ICD-10-CM | POA: Diagnosis not present

## 2022-11-28 DIAGNOSIS — G801 Spastic diplegic cerebral palsy: Secondary | ICD-10-CM | POA: Diagnosis not present

## 2022-11-28 DIAGNOSIS — Z853 Personal history of malignant neoplasm of breast: Secondary | ICD-10-CM | POA: Diagnosis not present

## 2022-11-28 DIAGNOSIS — K589 Irritable bowel syndrome without diarrhea: Secondary | ICD-10-CM | POA: Diagnosis not present

## 2022-11-28 DIAGNOSIS — K222 Esophageal obstruction: Secondary | ICD-10-CM | POA: Diagnosis not present

## 2022-11-28 DIAGNOSIS — J309 Allergic rhinitis, unspecified: Secondary | ICD-10-CM | POA: Diagnosis not present

## 2022-11-28 DIAGNOSIS — I890 Lymphedema, not elsewhere classified: Secondary | ICD-10-CM | POA: Diagnosis not present

## 2022-11-28 DIAGNOSIS — H6123 Impacted cerumen, bilateral: Secondary | ICD-10-CM | POA: Diagnosis not present

## 2022-11-28 DIAGNOSIS — G35 Multiple sclerosis: Secondary | ICD-10-CM | POA: Diagnosis not present

## 2022-11-28 DIAGNOSIS — Z981 Arthrodesis status: Secondary | ICD-10-CM | POA: Diagnosis not present

## 2022-11-28 DIAGNOSIS — H900 Conductive hearing loss, bilateral: Secondary | ICD-10-CM | POA: Diagnosis not present

## 2022-11-28 DIAGNOSIS — Z466 Encounter for fitting and adjustment of urinary device: Secondary | ICD-10-CM | POA: Diagnosis not present

## 2022-11-28 DIAGNOSIS — R1310 Dysphagia, unspecified: Secondary | ICD-10-CM | POA: Diagnosis not present

## 2022-11-28 DIAGNOSIS — K219 Gastro-esophageal reflux disease without esophagitis: Secondary | ICD-10-CM | POA: Diagnosis not present

## 2022-11-28 DIAGNOSIS — E119 Type 2 diabetes mellitus without complications: Secondary | ICD-10-CM | POA: Diagnosis not present

## 2022-11-28 DIAGNOSIS — D709 Neutropenia, unspecified: Secondary | ICD-10-CM | POA: Diagnosis not present

## 2022-11-28 DIAGNOSIS — I10 Essential (primary) hypertension: Secondary | ICD-10-CM | POA: Diagnosis not present

## 2022-11-28 DIAGNOSIS — D649 Anemia, unspecified: Secondary | ICD-10-CM | POA: Diagnosis not present

## 2022-11-28 DIAGNOSIS — G894 Chronic pain syndrome: Secondary | ICD-10-CM | POA: Diagnosis not present

## 2022-11-28 DIAGNOSIS — N319 Neuromuscular dysfunction of bladder, unspecified: Secondary | ICD-10-CM | POA: Diagnosis not present

## 2022-11-28 NOTE — Telephone Encounter (Signed)
FYI per Sheliah Mends-   VO orderes given to Thu.

## 2022-11-28 NOTE — Telephone Encounter (Signed)
Home Health Verbal Orders - Caller/Agency: Inda Coke OT  Callback Number: [phone] Requesting OT/PT/Skilled Nursing/Social Work/Speech Therapy: OT  Frequency:   Extension request   Every other week for 8 weeks She does have a secure line, please leave a message if she does not answer

## 2022-11-30 NOTE — Telephone Encounter (Signed)
Rebecca [name] was given VO

## 2022-12-02 ENCOUNTER — Inpatient Hospital Stay: Payer: 59

## 2022-12-05 DIAGNOSIS — I890 Lymphedema, not elsewhere classified: Secondary | ICD-10-CM | POA: Diagnosis not present

## 2022-12-05 DIAGNOSIS — I10 Essential (primary) hypertension: Secondary | ICD-10-CM | POA: Diagnosis not present

## 2022-12-05 DIAGNOSIS — H900 Conductive hearing loss, bilateral: Secondary | ICD-10-CM | POA: Diagnosis not present

## 2022-12-05 DIAGNOSIS — Z981 Arthrodesis status: Secondary | ICD-10-CM | POA: Diagnosis not present

## 2022-12-05 DIAGNOSIS — K592 Neurogenic bowel, not elsewhere classified: Secondary | ICD-10-CM | POA: Diagnosis not present

## 2022-12-05 DIAGNOSIS — K8689 Other specified diseases of pancreas: Secondary | ICD-10-CM | POA: Diagnosis not present

## 2022-12-05 DIAGNOSIS — J309 Allergic rhinitis, unspecified: Secondary | ICD-10-CM | POA: Diagnosis not present

## 2022-12-05 DIAGNOSIS — K589 Irritable bowel syndrome without diarrhea: Secondary | ICD-10-CM | POA: Diagnosis not present

## 2022-12-05 DIAGNOSIS — E119 Type 2 diabetes mellitus without complications: Secondary | ICD-10-CM | POA: Diagnosis not present

## 2022-12-05 DIAGNOSIS — D709 Neutropenia, unspecified: Secondary | ICD-10-CM | POA: Diagnosis not present

## 2022-12-05 DIAGNOSIS — M542 Cervicalgia: Secondary | ICD-10-CM | POA: Diagnosis not present

## 2022-12-05 DIAGNOSIS — K219 Gastro-esophageal reflux disease without esophagitis: Secondary | ICD-10-CM | POA: Diagnosis not present

## 2022-12-05 DIAGNOSIS — H6123 Impacted cerumen, bilateral: Secondary | ICD-10-CM | POA: Diagnosis not present

## 2022-12-05 DIAGNOSIS — Z853 Personal history of malignant neoplasm of breast: Secondary | ICD-10-CM | POA: Diagnosis not present

## 2022-12-05 DIAGNOSIS — G801 Spastic diplegic cerebral palsy: Secondary | ICD-10-CM | POA: Diagnosis not present

## 2022-12-05 DIAGNOSIS — K222 Esophageal obstruction: Secondary | ICD-10-CM | POA: Diagnosis not present

## 2022-12-05 DIAGNOSIS — D696 Thrombocytopenia, unspecified: Secondary | ICD-10-CM | POA: Diagnosis not present

## 2022-12-05 DIAGNOSIS — Z466 Encounter for fitting and adjustment of urinary device: Secondary | ICD-10-CM | POA: Diagnosis not present

## 2022-12-05 DIAGNOSIS — N319 Neuromuscular dysfunction of bladder, unspecified: Secondary | ICD-10-CM | POA: Diagnosis not present

## 2022-12-05 DIAGNOSIS — R1310 Dysphagia, unspecified: Secondary | ICD-10-CM | POA: Diagnosis not present

## 2022-12-05 DIAGNOSIS — G35 Multiple sclerosis: Secondary | ICD-10-CM | POA: Diagnosis not present

## 2022-12-05 DIAGNOSIS — G894 Chronic pain syndrome: Secondary | ICD-10-CM | POA: Diagnosis not present

## 2022-12-05 DIAGNOSIS — D649 Anemia, unspecified: Secondary | ICD-10-CM | POA: Diagnosis not present

## 2022-12-06 ENCOUNTER — Inpatient Hospital Stay: Payer: 59

## 2022-12-06 DIAGNOSIS — M542 Cervicalgia: Secondary | ICD-10-CM | POA: Diagnosis not present

## 2022-12-06 DIAGNOSIS — D696 Thrombocytopenia, unspecified: Secondary | ICD-10-CM | POA: Diagnosis not present

## 2022-12-06 DIAGNOSIS — Z853 Personal history of malignant neoplasm of breast: Secondary | ICD-10-CM | POA: Diagnosis not present

## 2022-12-06 DIAGNOSIS — J309 Allergic rhinitis, unspecified: Secondary | ICD-10-CM | POA: Diagnosis not present

## 2022-12-06 DIAGNOSIS — G801 Spastic diplegic cerebral palsy: Secondary | ICD-10-CM | POA: Diagnosis not present

## 2022-12-06 DIAGNOSIS — R1310 Dysphagia, unspecified: Secondary | ICD-10-CM | POA: Diagnosis not present

## 2022-12-06 DIAGNOSIS — K589 Irritable bowel syndrome without diarrhea: Secondary | ICD-10-CM | POA: Diagnosis not present

## 2022-12-06 DIAGNOSIS — Z981 Arthrodesis status: Secondary | ICD-10-CM | POA: Diagnosis not present

## 2022-12-06 DIAGNOSIS — K219 Gastro-esophageal reflux disease without esophagitis: Secondary | ICD-10-CM | POA: Diagnosis not present

## 2022-12-06 DIAGNOSIS — K592 Neurogenic bowel, not elsewhere classified: Secondary | ICD-10-CM | POA: Diagnosis not present

## 2022-12-06 DIAGNOSIS — E119 Type 2 diabetes mellitus without complications: Secondary | ICD-10-CM | POA: Diagnosis not present

## 2022-12-06 DIAGNOSIS — N319 Neuromuscular dysfunction of bladder, unspecified: Secondary | ICD-10-CM | POA: Diagnosis not present

## 2022-12-06 DIAGNOSIS — H900 Conductive hearing loss, bilateral: Secondary | ICD-10-CM | POA: Diagnosis not present

## 2022-12-06 DIAGNOSIS — H6123 Impacted cerumen, bilateral: Secondary | ICD-10-CM | POA: Diagnosis not present

## 2022-12-06 DIAGNOSIS — K8689 Other specified diseases of pancreas: Secondary | ICD-10-CM | POA: Diagnosis not present

## 2022-12-06 DIAGNOSIS — I10 Essential (primary) hypertension: Secondary | ICD-10-CM | POA: Diagnosis not present

## 2022-12-06 DIAGNOSIS — D709 Neutropenia, unspecified: Secondary | ICD-10-CM | POA: Diagnosis not present

## 2022-12-06 DIAGNOSIS — Z466 Encounter for fitting and adjustment of urinary device: Secondary | ICD-10-CM | POA: Diagnosis not present

## 2022-12-06 DIAGNOSIS — G894 Chronic pain syndrome: Secondary | ICD-10-CM | POA: Diagnosis not present

## 2022-12-06 DIAGNOSIS — G35 Multiple sclerosis: Secondary | ICD-10-CM | POA: Diagnosis not present

## 2022-12-06 DIAGNOSIS — D649 Anemia, unspecified: Secondary | ICD-10-CM | POA: Diagnosis not present

## 2022-12-06 DIAGNOSIS — I890 Lymphedema, not elsewhere classified: Secondary | ICD-10-CM | POA: Diagnosis not present

## 2022-12-06 DIAGNOSIS — K222 Esophageal obstruction: Secondary | ICD-10-CM | POA: Diagnosis not present

## 2022-12-07 ENCOUNTER — Encounter: Payer: Self-pay | Admitting: Physician Assistant

## 2022-12-07 ENCOUNTER — Ambulatory Visit (INDEPENDENT_AMBULATORY_CARE_PROVIDER_SITE_OTHER): Payer: 59 | Admitting: Physician Assistant

## 2022-12-07 VITALS — BP 130/84 | HR 75 | Temp 97.1°F | Ht 64.0 in

## 2022-12-07 DIAGNOSIS — S82202S Unspecified fracture of shaft of left tibia, sequela: Secondary | ICD-10-CM

## 2022-12-07 DIAGNOSIS — K8689 Other specified diseases of pancreas: Secondary | ICD-10-CM | POA: Diagnosis not present

## 2022-12-07 DIAGNOSIS — G35 Multiple sclerosis: Secondary | ICD-10-CM

## 2022-12-07 DIAGNOSIS — K592 Neurogenic bowel, not elsewhere classified: Secondary | ICD-10-CM | POA: Diagnosis not present

## 2022-12-07 DIAGNOSIS — G40909 Epilepsy, unspecified, not intractable, without status epilepticus: Secondary | ICD-10-CM | POA: Diagnosis not present

## 2022-12-07 DIAGNOSIS — Z743 Need for continuous supervision: Secondary | ICD-10-CM | POA: Diagnosis not present

## 2022-12-07 DIAGNOSIS — Z0000 Encounter for general adult medical examination without abnormal findings: Secondary | ICD-10-CM

## 2022-12-07 DIAGNOSIS — E559 Vitamin D deficiency, unspecified: Secondary | ICD-10-CM

## 2022-12-07 DIAGNOSIS — R279 Unspecified lack of coordination: Secondary | ICD-10-CM | POA: Diagnosis not present

## 2022-12-07 DIAGNOSIS — Z131 Encounter for screening for diabetes mellitus: Secondary | ICD-10-CM | POA: Diagnosis not present

## 2022-12-07 DIAGNOSIS — I890 Lymphedema, not elsewhere classified: Secondary | ICD-10-CM | POA: Diagnosis not present

## 2022-12-07 DIAGNOSIS — R262 Difficulty in walking, not elsewhere classified: Secondary | ICD-10-CM | POA: Diagnosis not present

## 2022-12-07 DIAGNOSIS — N950 Postmenopausal bleeding: Secondary | ICD-10-CM

## 2022-12-07 NOTE — Progress Notes (Signed)
Annual Physical Exam   Name: Rebecca [name]   MRN: [id number]    DOB: 06-04-1960   Date:12/07/2022  Today's Provider: Jacquelin Hawking, MHS, PA-C Introduced myself to the patient as a PA-C and provided education on APPs in clinical practice.         Subjective  Chief Complaint  Chief Complaint  Patient presents with   Medical Management of Chronic Issues    HPI  Patient presents for annual CPE and chronic follow up   She is also requesting renewal of her home health orders   She is going to [facility] with Dr. Marjory Lies - she states she has not physically been in office in some time- reports she goes in about once per year as tolerable   She is engaged in PT with home health - thinks she is close to being able to maneuver wheelchair to allow improved function  Previous fracture in left leg Occurred in 2022 - had surgical intervention but reports ongoing issues  She reports persistent pain in her left leg and foot from previous fracture She reports her ankle and foot remain in pain and has lateral lean even with brace   MS/ Seizures  She is managed by Neurology  She denies issues with her medication regimen Her most recent seizure was in 2013    Pancreatic insufficiency She reports improvement in diarrhea symptoms since taking Creon Currently managed by GI services   Diet: Overall normal diet. She denies choking or coughing, issues with swallowing currently. Has been cutting foods in small sizes to assist with reducing issues Exercise: limited by MS and pain in left leg. She is engaged with PT and doing leg exercises to help with proper alignment and joint function   Sleep: She reports she is sleeping well, gets about 7 hours per night, feels well rested  Mood: "not too bad"   Flowsheet Row Office Visit from 08/17/2021 in [facility]  AUDIT-C Score 0      Depression: Phq 9 is  negative    12/07/2022   10:12 AM 09/29/2022    8:31  AM 07/07/2022   10:55 AM 08/17/2021    1:22 PM 08/13/2020    2:43 PM  Depression screen PHQ 2/9  Decreased Interest 0 0 0 0 0  Down, Depressed, Hopeless 0 0 0 0 0  PHQ - 2 Score 0 0 0 0 0  Altered sleeping 0  0 0   Tired, decreased energy 0  0 0   Change in appetite 0  0 0   Feeling bad or failure about yourself  0  0 0   Trouble concentrating 0  0 0   Moving slowly or fidgety/restless 0  0 0   Suicidal thoughts 0  0 0   PHQ-9 Score 0  0 0   Difficult doing work/chores Not difficult at all       Hypertension: BP Readings from Last 3 Encounters:  12/07/22 130/84  09/17/22 130/60  09/09/22 123/76   Obesity: Wt Readings from Last 3 Encounters:  09/29/22 222 lb (100.7 kg)  09/17/22 222 lb 14.2 oz (101.1 kg)  09/09/22 223 lb (101.2 kg)   BMI Readings from Last 3 Encounters:  12/07/22 38.11 kg/m  09/29/22 38.11 kg/m  09/17/22 38.26 kg/m      Health Maintenance  Topic Date Due   Cologuard (Stool DNA test)  Never done   Pap with HPV screening  09/25/2021   Flu Shot  09/08/2022   Mammogram  09/28/2022   COVID-19 Vaccine (4 - 2023-24 season) 12/23/2022*   Zoster (Shingles) Vaccine (1 of 2) 03/09/2023*   Medicare Annual Wellness Visit  09/29/2023   Hepatitis C Screening  Completed   HIV Screening  Completed   HPV Vaccine  Aged Out   DTaP/Tdap/Td vaccine  Discontinued   Colon Cancer Screening  Discontinued  *Topic was postponed. The date shown is not the original due date.      STD testing and prevention (HIV/chl/gon/syphilis): declines  Intimate partner violence: negative Sexual History: sexual activity is limited due to conditions. When she is active it is with her husband  Menstrual History/LMP/Abnormal Bleeding: LMP was 2008, She reports some postmenopausal bleeding, had D&C in 2017 and has not had issues since then. She does report needing ref to OB/GYN services for routine follow up  Discussed importance of follow up if any post-menopausal bleeding:  yes Incontinence Symptoms: Yes.      Osteoporosis Prevention : Discussed high calcium and vitamin D supplementation, weight bearing exercises Bone density :not applicable  Cervical cancer screening: Unable to perform in office due to patient being in stretcher and mobility issues. Will place ref to Ob/Gyn   Skin cancer: Discussed monitoring for atypical lesions  Colorectal cancer screening: discussed screening today, GI has already ordered COLoguard and she is planning on doing soon Lung cancer:  Low Dose CT Chest recommended if Age 18-80 years, 20 pack-year currently smoking OR have quit w/in 15years. Patient does not qualify.   ECG: NA  Advanced Care Planning: A voluntary discussion about advance care planning including the explanation and discussion of advance directives.  Discussed health care proxy and Living will, and the patient was able to identify a health care proxy as no one.  Patient does not have a living will in effect.  Lipids: Lab Results  Component Value Date   CHOL 138 12/11/2020   CHOL 197 01/18/2019   CHOL 214 (H) 11/25/2015   Lab Results  Component Value Date   HDL 48 12/11/2020   HDL 62 01/18/2019   HDL 58 11/25/2015   Lab Results  Component Value Date   LDLCALC 77 12/11/2020   LDLCALC 113 (H) 01/18/2019   LDLCALC 134 (H) 11/25/2015   Lab Results  Component Value Date   TRIG 63 12/11/2020   TRIG 114 01/18/2019   TRIG 109 11/25/2015   Lab Results  Component Value Date   CHOLHDL 2.9 12/11/2020   CHOLHDL 3.2 01/18/2019   CHOLHDL 3.7 11/25/2015   No results found for: "LDLDIRECT"  Glucose: Glucose  Date Value Ref Range Status  06/05/2014 130 (H) mg/dL Final    Comment:    16-10 NOTE: New Reference Range  04/15/14   05/29/2014 115 (H) mg/dL Final    Comment:    96-04 NOTE: New Reference Range  04/15/14   05/15/2014 116 (H) mg/dL Final    Comment:    54-09 NOTE: New Reference Range  04/15/14    Glucose, Bld  Date Value Ref Range  Status  08/13/2022 121 (H) 70 - 99 mg/dL Final    Comment:    Glucose reference range applies only to samples taken after fasting for at least 8 hours.  07/07/2022 86 65 - 99 mg/dL Final    Comment:    .            Fasting reference interval .   12/18/2020 106 (H) 70 - 99 mg/dL  Final    Comment:    Glucose reference range applies only to samples taken after fasting for at least 8 hours.   Glucose-Capillary  Date Value Ref Range Status  12/17/2020 108 (H) 70 - 99 mg/dL Final    Comment:    Glucose reference range applies only to samples taken after fasting for at least 8 hours.  02/06/2018 92 70 - 99 mg/dL Final  16/11/9602 540 (H) 70 - 99 mg/dL Final    Patient Active Problem List   Diagnosis Date Noted   Pancreatic insufficiency 12/07/2022   Conductive hearing loss, bilateral 06/29/2021   Tibia/fibula fracture, left, closed, initial encounter 12/08/2020   Dysphagia    Acute esophagitis    Gastric erythema    Mucosal abnormality of duodenum    Schatzki's ring    Diarrhea 04/14/2020   Elevated BP without diagnosis of hypertension 04/14/2020   Neuromuscular dysfunction of bladder, unspecified 03/27/2020   Acquired absence of unspecified breast and nipple 02/08/2020   History of falling 02/08/2020   Personal history of urinary (tract) infections 02/08/2020   Muscle spasm 02/28/2019   Post-menopausal bleeding 11/20/2018   Ambulatory dysfunction 02/02/2018   Abnormality of gait 11/17/2017   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   Bilateral lower extremity pain (primary) (bilateral) (right greater than left) 08/01/2016   Chronic neck pain (secondary) (bilateral) ( left greater than right) 07/28/2016   Neutropenia (HCC) 06/27/2016   Chronic pain syndrome 06/03/2016   Neurogenic bladder    Neurogenic bowel    Detrusor and sphincter dyssynergia    Urinary incontinence    Seizure disorder (HCC)    Slow transit constipation    Spastic diplegia  (HCC)    Lymphedema    History of breast cancer in female 10/28/2015   Anemia 10/28/2015   Thrombocytopenia (HCC) 10/28/2015   Allergic rhinitis 10/28/2015   IBS (irritable bowel syndrome) 10/28/2015   Uterine leiomyoma 10/24/2014   History of right mastectomy 10/24/2014   MS (multiple sclerosis) (HCC) 09/16/2014   Primary cancer of right female breast (HCC) 01/10/2014   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   CHOLECYSTECTOMY     COLONOSCOPY  2014   ESOPHAGOGASTRODUODENOSCOPY (EGD) WITH PROPOFOL N/A 07/16/2020   Procedure: ESOPHAGOGASTRODUODENOSCOPY (EGD) WITH PROPOFOL;  Surgeon: Pasty Spillers, MD;  Location: [facility] ENDOSCOPY;  Service: Endoscopy;  Laterality: N/A;  Patient has MS and will need assistance   HYSTEROSCOPY WITH D & C N/A 11/27/2018   Procedure: DILATATION AND CURETTAGE /HYSTEROSCOPY;  Surgeon: Nadara Mustard, MD;  Location: [facility] ORS;  Service: Gynecology;  Laterality: N/A;   Lower extremity venous Dopplers  02/27/2013   No LE DVT   MASTECTOMY Right 2015   ORIF TIBIA PLATEAU Left 12/09/2020   Procedure: LEFT OPEN REDUCTION INTERNAL FIXATION (ORIF) TIBIAL PLATEAU;  Surgeon: Roby Lofts, MD;  Location: [facility] OR;  Service: Orthopedics;  Laterality: Left;   Port a cath insertion Right 01/19/2010   PORT-A-CATH REMOVAL  right   PORT-A-CATH REMOVAL Right 09/03/2013   Procedure: REMOVAL PORT-A-CATH;  Surgeon: Fransisco Hertz, MD;  Location: [facility];  Service: Vascular;  Laterality: Right;   SPINE SURGERY  Cervical steinois Dr. Venetia Maxon 2014 I think?   TONSILLECTOMY     TRANSTHORACIC ECHOCARDIOGRAM  03/2013; 02/2014   a) Normal LV size and function with EF 60-65%.; Cannot exclude bicuspid aortic valve with mild AS and mild AI.; b) Normal EF with normal wall motion and valve function x Mild MR. G2 DD. EF 60-65%. Tricuspid AoV   UPPER GI ENDOSCOPY  2014    Family History  Problem Relation Age of Onset   Cancer Father        skin   Heart disease Father    Heart attack Father        heart attack in his 72's   Thyroid disease Sister    Ovarian cancer Cousin    Breast cancer Maternal Aunt 60   Breast cancer Maternal Grandmother 33   Bladder Cancer Neg Hx    Kidney cancer Neg Hx     Social History   Socioeconomic History   Marital status: Married    Spouse name: don   Number of children: 0   Years of education: 12   Highest education level: 12th grade  Occupational History   Occupation: disability  Tobacco Use   Smoking status: Never   Smokeless tobacco: Never   Tobacco comments:    Never smoked. My mom smoked. Passed 04/18/20. Dad smoked, quit 1973. Sister smokes.  Vaping Use   Vaping status: Never Used  Substance and Sexual Activity   Alcohol use: No   Drug use: No   Sexual activity: Not Currently    Partners: Male    Birth control/protection: None  Other Topics Concern   Not on file  Social History Narrative   She is married. Recently moved back to [place] after being in [place] for some time. She is accompanied by her husband and aunt.   Never smoked. Never used alcohol.   Social Determinants of Health   Financial Resource Strain: Low Risk  (12/03/2022)   Overall Financial Resource Strain (CARDIA)    Difficulty of Paying Living Expenses: Not hard at all  Food Insecurity: No Food Insecurity (12/03/2022)   Hunger Vital Sign    Worried About Running Out of Food in the Last Year:  Never true    Ran Out of Food in the Last Year: Never true  Transportation Needs: No Transportation Needs (12/03/2022)   PRAPARE - Administrator, Civil Service (Medical): No    Lack of Transportation (Non-Medical): No  Physical Activity: Inactive (12/03/2022)   Exercise Vital Sign    Days of Exercise per Week: 0 days    Minutes of Exercise per Session: 30 min  Stress: No Stress Concern Present (12/03/2022)   [employer] of Occupational Health - Occupational Stress Questionnaire    Feeling of Stress : Not at all  Social Connections: Socially Isolated (12/03/2022)   Social Connection and Isolation Panel [NHANES]    Frequency of Communication with Friends and Family: Once a week    Frequency of Social Gatherings with Friends and Family: Once a week    Attends Religious Services: Never    Database administrator or Organizations: No    Attends Banker Meetings: Never    Marital Status: Married  Catering manager Violence: Not At Risk (09/29/2022)   Humiliation, Afraid, Rape,  and Kick questionnaire    Fear of Current or Ex-Partner: No    Emotionally Abused: No    Physically Abused: No    Sexually Abused: No     Current Outpatient Medications:    amantadine (SYMMETREL) 100 MG capsule, TAKE 1 CAPSULE BY MOUTH THREE TIMES A DAY, Disp: 270 capsule, Rfl: 1   baclofen (LIORESAL) 10 MG tablet, Take 1-2 tablets (10-20 mg total) by mouth 3 (three) times daily., Disp: 120 tablet, Rfl: 12   gluconic acid-citric acid (RENACIDIN) irrigation, Insert 30 mL into suprapubic tube and clamp tube for 10 minutes and then drain twice daily, Disp: 500 mL, Rfl: 1   Glucosamine-Chondroitin (COSAMIN DS PO), Take 1 tablet by mouth 5 (five) times daily., Disp: , Rfl:    levETIRAcetam (KEPPRA) 500 MG tablet, Take 1 tablet (500 mg total) by mouth 2 (two) times daily., Disp: 180 tablet, Rfl: 4   lidocaine-prilocaine (EMLA) cream, Apply 1 application topically as needed., Disp: 30 g, Rfl:  0   lipase/protease/amylase (CREON) 36000 UNITS CPEP capsule, Take 2 capsules (72,000 Units total) by mouth 3 (three) times daily with meals AND 1 capsule (36,000 Units total) with snacks., Disp: 270 capsule, Rfl: 11   loratadine (CLARITIN) 10 MG tablet, Take 10 mg by mouth daily., Disp: , Rfl:    Misc Natural Products (LEG VEIN & CIRCULATION) TABS, Take 1 tablet by mouth 2 (two) times daily. , Disp: , Rfl:    Multiple Vitamins-Minerals (HAIR SKIN AND NAILS FORMULA) TABS, Take 1 tablet by mouth 2 (two) times daily. , Disp: , Rfl:    Multiple Vitamins-Minerals (MULTIVITAMIN PO), Take 1 tablet by mouth daily. , Disp: , Rfl:    nystatin (MYCOSTATIN/NYSTOP) powder, Apply 1 application topically 3 (three) times daily as needed. For rash/raw skin as needed, Disp: 15 g, Rfl: 2   omeprazole (PRILOSEC) 40 MG capsule, Take 1 capsule (40 mg total) by mouth daily., Disp: 90 capsule, Rfl: 1   oxybutynin (DITROPAN-XL) 10 MG 24 hr tablet, Take 1 tablet (10 mg total) by mouth 2 (two) times daily., Disp: 120 tablet, Rfl: 0   REBIF REBIDOSE 44 MCG/0.5ML SOAJ, INJECT 1 PEN UNDER THE SKIN 3 TIMES PER WEEK, Disp: 44 mL, Rfl: 2   tiZANidine (ZANAFLEX) 4 MG tablet, Take 1 tablet (4 mg total) by mouth at bedtime., Disp: 90 tablet, Rfl: 4   Turmeric 500 MG CAPS, Take 500 mg by mouth daily., Disp: , Rfl:   Allergies  Allergen Reactions   Fentanyl Nausea And Vomiting and Nausea Only    vomiting Was given in PACU x3 each time patient got sick    Sulfa Antibiotics Hives and Other (See Comments)    Light headed, over heated     Review of Systems  Constitutional:  Negative for chills, fever and weight loss.  HENT:  Negative for hearing loss, nosebleeds, sore throat and tinnitus.   Eyes:  Negative for blurred vision, double vision and photophobia.  Respiratory:  Negative for cough, shortness of breath and wheezing.   Cardiovascular:  Negative for chest pain, palpitations and leg swelling.  Gastrointestinal:  Negative  for abdominal pain, blood in stool, constipation, diarrhea, heartburn, nausea and vomiting.  Genitourinary:  Negative for dysuria, flank pain and hematuria.  Musculoskeletal:  Positive for myalgias. Negative for falls.  Skin:  Negative for itching and rash.  Neurological:  Positive for weakness. Negative for dizziness, tingling, tremors, seizures, loss of consciousness and headaches.  Psychiatric/Behavioral:  Negative for depression, memory loss and  suicidal ideas. The patient is not nervous/anxious and does not have insomnia.       Objective  Vitals:   12/07/22 1013  BP: 130/84  Pulse: 75  Temp: (!) 97.1 F (36.2 C)  TempSrc: Temporal  SpO2: 97%  Height: 5\' 4"  (1.626 m)    Body mass index is 38.11 kg/m.  Physical Exam Vitals reviewed.  Constitutional:      General: She is awake.     Appearance: Normal appearance. She is well-developed and well-groomed.     Comments: Patient presents today on stretcher from medical transport   HENT:     Head: Normocephalic and atraumatic.     Left Ear: Hearing, tympanic membrane and ear canal normal.     Ears:     Comments: Unable to examine right ear due to stretcher position     Mouth/Throat:     Lips: Pink.     Mouth: Mucous membranes are moist.     Pharynx: No pharyngeal swelling, oropharyngeal exudate or posterior oropharyngeal erythema.  Eyes:     General: Lids are normal. Gaze aligned appropriately.     Extraocular Movements: Extraocular movements intact.     Conjunctiva/sclera: Conjunctivae normal.     Pupils: Pupils are equal, round, and reactive to light.  Cardiovascular:     Rate and Rhythm: Normal rate and regular rhythm.     Pulses: Normal pulses.          Radial pulses are 2+ on the right side and 2+ on the left side.     Heart sounds: No murmur heard.    No friction rub. No gallop.     Comments: Nonpitting edema bilaterally from knee to foot  Pulmonary:     Effort: Pulmonary effort is normal.     Breath sounds:  Normal breath sounds. No decreased air movement. No decreased breath sounds, wheezing or rhonchi.  Abdominal:     General: Bowel sounds are normal.     Palpations: Abdomen is soft.     Tenderness: There is no abdominal tenderness.  Musculoskeletal:     Cervical back: Normal range of motion and neck supple.     Right lower leg: Edema present.     Left lower leg: Edema present.     Comments: Dorsiflexion and plantar flexion  is 1/5 on right side, 3/5 on left side  Left foot is laterally flexed with minimal ability to turn to neutral position    Skin:    General: Skin is warm and dry.  Neurological:     Mental Status: She is alert and oriented to person, place, and time.     GCS: GCS eye subscore is 4. GCS verbal subscore is 5. GCS motor subscore is 6.     Cranial Nerves: No cranial nerve deficit, dysarthria or facial asymmetry.     Motor: Weakness and abnormal muscle tone present. No tremor or atrophy.     Comments: Grip strength is 5/5 bilaterally   Psychiatric:        Attention and Perception: Attention and perception normal.        Mood and Affect: Mood and affect normal.        Speech: Speech normal.        Behavior: Behavior normal. Behavior is cooperative.        Cognition and Memory: Cognition and memory normal.      Recent Results (from the past 2160 hour(s))  Urinalysis, Complete w Microscopic -Urine, Catheterized; Indwelling urinary catheter  Status: Abnormal   Collection Time: 09/17/22 11:39 PM  Result Value Ref Range   Color, Urine YELLOW (A) YELLOW   APPearance CLOUDY (A) CLEAR   Specific Gravity, Urine 1.019 1.005 - 1.030   pH 6.0 5.0 - 8.0   Glucose, UA NEGATIVE NEGATIVE mg/dL   Hgb urine dipstick NEGATIVE NEGATIVE   Bilirubin Urine NEGATIVE NEGATIVE   Ketones, ur NEGATIVE NEGATIVE mg/dL   Protein, ur 664 (A) NEGATIVE mg/dL   Nitrite POSITIVE (A) NEGATIVE   Leukocytes,Ua LARGE (A) NEGATIVE   RBC / HPF 6-10 0 - 5 RBC/hpf   WBC, UA >50 0 - 5 WBC/hpf    Bacteria, UA MANY (A) NONE SEEN   Squamous Epithelial / HPF 6-10 0 - 5 /HPF   WBC Clumps PRESENT    Mucus PRESENT     Comment: Performed at [facility], [address] [place], [place] [address]  Urine Culture     Status: Abnormal   Collection Time: 09/17/22 11:39 PM   Specimen: Urine, Catheterized  Result Value Ref Range   Specimen Description      URINE, CATHETERIZED Performed at [facility], [address], [place], [place] [address]    Special Requests      NONE Performed at [facility], [address] [place], [place] [address]    Culture MULTIPLE SPECIES PRESENT, SUGGEST RECOLLECTION (A)    Report Status 09/19/2022 FINAL   Cancer antigen 27.29     Status: None   Collection Time: 10/13/22 10:07 AM  Result Value Ref Range   CA 27.29 15.7 0.0 - 38.6 U/mL    Comment: (NOTE) Siemens Centaur Immunochemiluminometric Methodology (ICMA) Values obtained with different assay methods or kits cannot be used interchangeably. Results cannot be interpreted as absolute evidence of the presence or absence of malignant disease. Performed At: [facility] [address] [place], [place] [id number] Jolene Schimke MD AC:[id number]   CBC with Differential (Cancer Center Only)     Status: Abnormal   Collection Time: 10/13/22 10:07 AM  Result Value Ref Range   WBC Count 4.3 4.0 - 10.5 K/uL   RBC 4.49 3.87 - 5.11 MIL/uL   Hemoglobin 13.3 12.0 - 15.0 g/dL   HCT 10.9 32.3 - 55.7 %   MCV 90.6 80.0 - 100.0 fL   MCH 29.6 26.0 - 34.0 pg   MCHC 32.7 30.0 - 36.0 g/dL   RDW 32.2 02.5 - 42.7 %   Platelet Count 137 (L) 150 - 400 K/uL    Comment: SPECIMEN CHECKED FOR CLOTS   nRBC 0.0 0.0 - 0.2 %   Neutrophils Relative % 53 %   Neutro Abs 2.3 1.7 - 7.7 K/uL   Lymphocytes Relative 34 %   Lymphs Abs 1.5 0.7 - 4.0 K/uL   Monocytes Relative 8 %   Monocytes Absolute 0.4 0.1 - 1.0 K/uL   Eosinophils Relative 4 %   Eosinophils Absolute 0.2 0.0 - 0.5 K/uL    Basophils Relative 1 %   Basophils Absolute 0.0 0.0 - 0.1 K/uL   WBC Morphology DIFF. CONFIRMED BY SMEAR    RBC Morphology MORPHOLOGY UNREMARKABLE    Smear Review Normal platelet morphology     Comment: PLATELETS APPEAR DECREASED PLATELET COUNT CONFIRMED BY SMEAR    Immature Granulocytes 0 %   Abs Immature Granulocytes 0.01 0.00 - 0.07 K/uL    Comment: Performed at [facility], [address], [place], [place] [address]     Fall Risk:  12/07/2022   10:12 AM 09/26/2022    7:57 AM 08/17/2021    1:21 PM 08/13/2020    2:47 PM 04/14/2020   11:53 AM  Fall Risk   Falls in the past year? 0 0 0 1 1  Number falls in past yr: 0 0 0 1 0  Injury with Fall? 0 1 0 0 1  Risk for fall due to : No Fall Risks Impaired balance/gait;Impaired mobility Impaired mobility History of fall(s);Impaired balance/gait;Impaired mobility   Follow up Falls prevention discussed;Education provided;Falls evaluation completed Falls prevention discussed;Education provided Falls prevention discussed Falls prevention discussed      Functional Status Survey: Is the patient deaf or have difficulty hearing?: No Does the patient have difficulty seeing, even when wearing glasses/contacts?: No Does the patient have difficulty concentrating, remembering, or making decisions?: No Does the patient have difficulty walking or climbing stairs?: Yes Does the patient have difficulty dressing or bathing?: Yes Does the patient have difficulty doing errands alone such as visiting a doctor's office or shopping?: Yes   Assessment & Plan  Problem List Items Addressed This Visit       Digestive   Neurogenic bowel    Suspect that this is ongoing and chronic Patient is followed by GI regularly and reports improvement to bowel symptoms since being on Creon Continue with GI management and recommendations Follow-up in 6 months or sooner if concerns arise      Relevant Orders   Ambulatory referral to Home Health   B12    Pancreatic insufficiency    Chronic, historic condition Currently managed by GI.  She reports that symptoms have significantly improved since starting Creon Continue current regimen as directed by GI Follow-up in 6 months or sooner if concerns arise      Relevant Orders   Lipase   Amylase     Nervous and Auditory   MS (multiple sclerosis) (HCC)    Chronic, ongoing Unable to review Neurology notes but she is seeing [facility] with Dr. Marjory Lies via virtual visit with at least once per year physical office apts? She reports difficulty with right leg weakness and mobility but has more pain and discomfort in left leg due to previous fracture  She denies issues with medications or concerns today Recommend continued collaboration with Neurology  Will renew Home health orders to provide continued PT, OT and home aid services Follow up in 6 months or sooner if concerns arise        Relevant Orders   Ambulatory referral to Home Health   Seizure disorder [facility])    Chronic, historic condition Patient is currently managed by neurology She is currently taking Keppra 500 mg p.o. twice daily and appears to be tolerating well Continue current regimen Continue collaboration with  neurology Follow-up in 6 months or sooner if concerns arise      Relevant Orders   Ambulatory referral to Home Health   B12     Musculoskeletal and Integument   Tibia/fibula fracture, left, closed, initial encounter    Chronic, historic condition Patient fell and sustained a left tib-fib fracture in 2022.  She then underwent surgical intervention but reports persistent pain and difficulties with lower leg mobility. Notable decrease in ROM of foot and ankle. Referral placed to orthopedics for evaluation and further management Follow-up in 6 months or sooner if concerns arise        Other   Lymphedema (Chronic)    Likely chronic, ongoing Bilateral lower extremities have nonpitting edema.  She does not  appear to be volume overloaded as lung sounds are normal, no cardiac murmurs or gallops/rubs to auscultation. Heart based on previous chart review there is note of grade 1 diastolic dysfunction with preserved EF.  May need to refer to cardiology for repeat echo and surveillance For now recommend elevation, exercise, compression stockings as tolerated to assist with swelling Follow-up as needed for progressing or persistent symptoms      Ambulatory dysfunction    Ongoing, chronic Secondary to MS and previous left leg fracture She is currently enrolled in home physical therapy and reports good progress.  She has goals of improving mobility to at least the level of using a wheelchair within the next few months Home health orders renewed today-continue with recommended management Follow-up in 6 months or sooner if concerns arise      Relevant Orders   Ambulatory referral to Home Health   Post-menopausal bleeding    History of this  She had D&C procedure in 2020 and reports no concerns since then Recommend Ob/Gyn services for Pap screening-referral placed today      Relevant Orders   Ambulatory referral to Obstetrics / Gynecology   Other Visit Diagnoses     Annual physical exam    -  Primary   Relevant Orders   COMPLETE METABOLIC PANEL WITH GFR   CBC w/Diff/Platelet   Lipid Profile   HgB A1c   TSH   Screening for diabetes mellitus (DM)       Relevant Orders   HgB A1c   Vitamin D deficiency       Relevant Orders   Vitamin D (25 hydroxy)      -USPSTF grade A and B recommendations reviewed with patient; age-appropriate recommendations, preventive care, screening tests, etc discussed and encouraged; healthy living encouraged; see AVS for patient education given to patient -Discussed importance of 150 minutes of physical activity weekly, eat two servings of fish weekly, eat one serving of tree nuts ( cashews, pistachios, pecans, almonds.Marland Kitchen) every other day, eat 6 servings of  fruit/vegetables daily and drink plenty of water and avoid sweet beverages.   -Reviewed Health Maintenance: Yes.    Return in about 6 months (around 06/07/2023) for MS, seizure, GI issues, previous left leg fracture.   I, Raenell Mensing E Bevan Disney, PA-C, have reviewed all documentation for this visit. The documentation on 12/07/22 for the exam, diagnosis, procedures, and orders are all accurate and complete.   Jacquelin Hawking, MHS, PA-C [facility]

## 2022-12-07 NOTE — Assessment & Plan Note (Signed)
Suspect that this is ongoing and chronic Patient is followed by GI regularly and reports improvement to bowel symptoms since being on Creon Continue with GI management and recommendations Follow-up in 6 months or sooner if concerns arise

## 2022-12-07 NOTE — Assessment & Plan Note (Signed)
Chronic, historic condition Patient is currently managed by neurology She is currently taking Keppra 500 mg p.o. twice daily and appears to be tolerating well Continue current regimen Continue collaboration with  neurology Follow-up in 6 months or sooner if concerns arise

## 2022-12-07 NOTE — Assessment & Plan Note (Signed)
Ongoing, chronic Secondary to MS and previous left leg fracture She is currently enrolled in home physical therapy and reports good progress.  She has goals of improving mobility to at least the level of using a wheelchair within the next few months Home health orders renewed today-continue with recommended management Follow-up in 6 months or sooner if concerns arise

## 2022-12-07 NOTE — Patient Instructions (Signed)
It was nice to meet you and I appreciate the opportunity to be involved in your care If you were satisfied with the care you received from me, I would greatly appreciate you saying so in the after-visit survey that is sent out following our visit.   

## 2022-12-07 NOTE — Assessment & Plan Note (Addendum)
History of this  She had D&C procedure in 2020 and reports no concerns since then Recommend Ob/Gyn services for Pap screening-referral placed today

## 2022-12-07 NOTE — Assessment & Plan Note (Signed)
Chronic, historic condition Patient fell and sustained a left tib-fib fracture in 2022.  She then underwent surgical intervention but reports persistent pain and difficulties with lower leg mobility. Notable decrease in ROM of foot and ankle. Referral placed to orthopedics for evaluation and further management Follow-up in 6 months or sooner if concerns arise

## 2022-12-07 NOTE — Assessment & Plan Note (Signed)
Likely chronic, ongoing Bilateral lower extremities have nonpitting edema.  She does not appear to be volume overloaded as lung sounds are normal, no cardiac murmurs or gallops/rubs to auscultation. Heart based on previous chart review there is note of grade 1 diastolic dysfunction with preserved EF.  May need to refer to cardiology for repeat echo and surveillance For now recommend elevation, exercise, compression stockings as tolerated to assist with swelling Follow-up as needed for progressing or persistent symptoms

## 2022-12-07 NOTE — Assessment & Plan Note (Signed)
Chronic, ongoing Unable to review Neurology notes but she is seeing [facility] with Dr. Marjory Lies via virtual visit with at least once per year physical office apts? She reports difficulty with right leg weakness and mobility but has more pain and discomfort in left leg due to previous fracture  She denies issues with medications or concerns today Recommend continued collaboration with Neurology  Will renew Home health orders to provide continued PT, OT and home aid services Follow up in 6 months or sooner if concerns arise

## 2022-12-07 NOTE — Assessment & Plan Note (Signed)
Chronic, historic condition Currently managed by GI.  She reports that symptoms have significantly improved since starting Creon Continue current regimen as directed by GI Follow-up in 6 months or sooner if concerns arise

## 2022-12-08 ENCOUNTER — Other Ambulatory Visit: Payer: Self-pay

## 2022-12-08 DIAGNOSIS — G40909 Epilepsy, unspecified, not intractable, without status epilepticus: Secondary | ICD-10-CM

## 2022-12-08 DIAGNOSIS — R262 Difficulty in walking, not elsewhere classified: Secondary | ICD-10-CM

## 2022-12-08 DIAGNOSIS — G35 Multiple sclerosis: Secondary | ICD-10-CM

## 2022-12-08 DIAGNOSIS — K592 Neurogenic bowel, not elsewhere classified: Secondary | ICD-10-CM

## 2022-12-08 LAB — CBC WITH DIFFERENTIAL/PLATELET
Absolute Lymphocytes: 1092 {cells}/uL (ref 850–3900)
Absolute Monocytes: 369 {cells}/uL (ref 200–950)
Basophils Absolute: 10 {cells}/uL (ref 0–200)
Basophils Relative: 0.2 %
Eosinophils Absolute: 99 {cells}/uL (ref 15–500)
Eosinophils Relative: 1.9 %
HCT: 42.4 % (ref 35.0–45.0)
Hemoglobin: 13.9 g/dL (ref 11.7–15.5)
MCH: 29.8 pg (ref 27.0–33.0)
MCHC: 32.8 g/dL (ref 32.0–36.0)
MCV: 91 fL (ref 80.0–100.0)
MPV: 12.3 fL (ref 7.5–12.5)
Monocytes Relative: 7.1 %
Neutro Abs: 3630 {cells}/uL (ref 1500–7800)
Neutrophils Relative %: 69.8 %
Platelets: 171 10*3/uL (ref 140–400)
RBC: 4.66 10*6/uL (ref 3.80–5.10)
RDW: 12.7 % (ref 11.0–15.0)
Total Lymphocyte: 21 %
WBC: 5.2 10*3/uL (ref 3.8–10.8)

## 2022-12-08 LAB — COMPLETE METABOLIC PANEL WITH GFR
AG Ratio: 1.2 (calc) (ref 1.0–2.5)
ALT: 23 U/L (ref 6–29)
AST: 21 U/L (ref 10–35)
Albumin: 4.3 g/dL (ref 3.6–5.1)
Alkaline phosphatase (APISO): 99 U/L (ref 37–153)
BUN: 10 mg/dL (ref 7–25)
CO2: 30 mmol/L (ref 20–32)
Calcium: 9.8 mg/dL (ref 8.6–10.4)
Chloride: 102 mmol/L (ref 98–110)
Creat: 0.59 mg/dL (ref 0.50–1.05)
Globulin: 3.6 g/dL (ref 1.9–3.7)
Glucose, Bld: 99 mg/dL (ref 65–99)
Potassium: 4.2 mmol/L (ref 3.5–5.3)
Sodium: 139 mmol/L (ref 135–146)
Total Bilirubin: 0.5 mg/dL (ref 0.2–1.2)
Total Protein: 7.9 g/dL (ref 6.1–8.1)
eGFR: 102 mL/min/{1.73_m2} (ref >=60)

## 2022-12-08 LAB — VITAMIN B12: Vitamin B-12: 363 pg/mL (ref 200–1100)

## 2022-12-08 LAB — HEMOGLOBIN A1C
Hgb A1c MFr Bld: 5.8 %{Hb} — ABNORMAL HIGH (ref <5.7)
Mean Plasma Glucose: 120 mg/dL
eAG (mmol/L): 6.6 mmol/L

## 2022-12-08 LAB — LIPID PANEL
Cholesterol: 199 mg/dL (ref <200)
HDL: 55 mg/dL (ref >=50)
LDL Cholesterol (Calc): 119 mg/dL — ABNORMAL HIGH
Non-HDL Cholesterol (Calc): 144 mg/dL — ABNORMAL HIGH (ref <130)
Total CHOL/HDL Ratio: 3.6 (calc) (ref <5.0)
Triglycerides: 137 mg/dL (ref <150)

## 2022-12-08 LAB — LIPASE: Lipase: 9 U/L (ref 7–60)

## 2022-12-08 LAB — AMYLASE: Amylase: 26 U/L (ref 21–101)

## 2022-12-08 LAB — VITAMIN D 25 HYDROXY (VIT D DEFICIENCY, FRACTURES): Vit D, 25-Hydroxy: 57 ng/mL (ref 30–100)

## 2022-12-08 LAB — TSH: TSH: 3 m[IU]/L (ref 0.40–4.50)

## 2022-12-08 NOTE — Telephone Encounter (Signed)
Copied from CRM [id number]. Topic: General - Other >> Dec 08, 2022  8:53 AM Turkey B wrote: Reason for CRM: Laren Everts from [facility] says can't accommodate pt for referral because she can't get out of her wheelchair and needs a gurney, and they aren't equipped for that

## 2022-12-09 ENCOUNTER — Telehealth: Payer: Self-pay | Admitting: Family Medicine

## 2022-12-09 DIAGNOSIS — Z853 Personal history of malignant neoplasm of breast: Secondary | ICD-10-CM | POA: Diagnosis not present

## 2022-12-09 DIAGNOSIS — J309 Allergic rhinitis, unspecified: Secondary | ICD-10-CM | POA: Diagnosis not present

## 2022-12-09 DIAGNOSIS — I890 Lymphedema, not elsewhere classified: Secondary | ICD-10-CM | POA: Diagnosis not present

## 2022-12-09 DIAGNOSIS — N319 Neuromuscular dysfunction of bladder, unspecified: Secondary | ICD-10-CM | POA: Diagnosis not present

## 2022-12-09 DIAGNOSIS — D696 Thrombocytopenia, unspecified: Secondary | ICD-10-CM | POA: Diagnosis not present

## 2022-12-09 DIAGNOSIS — K589 Irritable bowel syndrome without diarrhea: Secondary | ICD-10-CM | POA: Diagnosis not present

## 2022-12-09 DIAGNOSIS — Z981 Arthrodesis status: Secondary | ICD-10-CM | POA: Diagnosis not present

## 2022-12-09 DIAGNOSIS — K219 Gastro-esophageal reflux disease without esophagitis: Secondary | ICD-10-CM | POA: Diagnosis not present

## 2022-12-09 DIAGNOSIS — E119 Type 2 diabetes mellitus without complications: Secondary | ICD-10-CM | POA: Diagnosis not present

## 2022-12-09 DIAGNOSIS — D649 Anemia, unspecified: Secondary | ICD-10-CM | POA: Diagnosis not present

## 2022-12-09 DIAGNOSIS — Z466 Encounter for fitting and adjustment of urinary device: Secondary | ICD-10-CM | POA: Diagnosis not present

## 2022-12-09 DIAGNOSIS — D709 Neutropenia, unspecified: Secondary | ICD-10-CM | POA: Diagnosis not present

## 2022-12-09 DIAGNOSIS — K8689 Other specified diseases of pancreas: Secondary | ICD-10-CM | POA: Diagnosis not present

## 2022-12-09 DIAGNOSIS — M542 Cervicalgia: Secondary | ICD-10-CM | POA: Diagnosis not present

## 2022-12-09 DIAGNOSIS — G894 Chronic pain syndrome: Secondary | ICD-10-CM | POA: Diagnosis not present

## 2022-12-09 DIAGNOSIS — R1310 Dysphagia, unspecified: Secondary | ICD-10-CM | POA: Diagnosis not present

## 2022-12-09 DIAGNOSIS — K222 Esophageal obstruction: Secondary | ICD-10-CM | POA: Diagnosis not present

## 2022-12-09 DIAGNOSIS — K592 Neurogenic bowel, not elsewhere classified: Secondary | ICD-10-CM | POA: Diagnosis not present

## 2022-12-09 DIAGNOSIS — G35 Multiple sclerosis: Secondary | ICD-10-CM | POA: Diagnosis not present

## 2022-12-09 DIAGNOSIS — H900 Conductive hearing loss, bilateral: Secondary | ICD-10-CM | POA: Diagnosis not present

## 2022-12-09 DIAGNOSIS — I10 Essential (primary) hypertension: Secondary | ICD-10-CM | POA: Diagnosis not present

## 2022-12-09 DIAGNOSIS — G801 Spastic diplegic cerebral palsy: Secondary | ICD-10-CM | POA: Diagnosis not present

## 2022-12-09 DIAGNOSIS — H6123 Impacted cerumen, bilateral: Secondary | ICD-10-CM | POA: Diagnosis not present

## 2022-12-09 NOTE — Telephone Encounter (Signed)
Trey Paula verbalized understanding

## 2022-12-09 NOTE — Telephone Encounter (Signed)
Home Health Verbal Orders - Caller/Agency: Trey Paula from [facility] Callback Number: [phone] Requesting  wants permission to change foley catheter to a 20 in french Frequency:

## 2022-12-13 ENCOUNTER — Inpatient Hospital Stay: Payer: 59

## 2022-12-15 ENCOUNTER — Inpatient Hospital Stay: Payer: 59 | Attending: Oncology

## 2022-12-15 DIAGNOSIS — R69 Illness, unspecified: Secondary | ICD-10-CM | POA: Diagnosis not present

## 2022-12-15 DIAGNOSIS — L03116 Cellulitis of left lower limb: Secondary | ICD-10-CM | POA: Diagnosis not present

## 2022-12-15 DIAGNOSIS — Z853 Personal history of malignant neoplasm of breast: Secondary | ICD-10-CM | POA: Insufficient documentation

## 2022-12-15 DIAGNOSIS — Z452 Encounter for adjustment and management of vascular access device: Secondary | ICD-10-CM | POA: Diagnosis not present

## 2022-12-15 DIAGNOSIS — R262 Difficulty in walking, not elsewhere classified: Secondary | ICD-10-CM | POA: Diagnosis not present

## 2022-12-15 DIAGNOSIS — R279 Unspecified lack of coordination: Secondary | ICD-10-CM | POA: Diagnosis not present

## 2022-12-15 DIAGNOSIS — Z743 Need for continuous supervision: Secondary | ICD-10-CM | POA: Diagnosis not present

## 2022-12-15 DIAGNOSIS — Z95828 Presence of other vascular implants and grafts: Secondary | ICD-10-CM

## 2022-12-15 DIAGNOSIS — M6281 Muscle weakness (generalized): Secondary | ICD-10-CM | POA: Diagnosis not present

## 2022-12-15 MED ORDER — HEPARIN SOD (PORK) LOCK FLUSH 100 UNIT/ML IV SOLN
500.0000 [IU] | Freq: Once | INTRAVENOUS | Status: AC
  Administered 2022-12-15: 500 [IU] via INTRAVENOUS
  Filled 2022-12-15: qty 5

## 2022-12-15 MED ORDER — SODIUM CHLORIDE 0.9% FLUSH
10.0000 mL | INTRAVENOUS | Status: DC | PRN
  Administered 2022-12-15: 10 mL via INTRAVENOUS
  Filled 2022-12-15: qty 10

## 2022-12-15 NOTE — Patient Instructions (Signed)

## 2022-12-16 DIAGNOSIS — K592 Neurogenic bowel, not elsewhere classified: Secondary | ICD-10-CM | POA: Diagnosis not present

## 2022-12-16 DIAGNOSIS — K222 Esophageal obstruction: Secondary | ICD-10-CM | POA: Diagnosis not present

## 2022-12-16 DIAGNOSIS — D709 Neutropenia, unspecified: Secondary | ICD-10-CM | POA: Diagnosis not present

## 2022-12-16 DIAGNOSIS — G35 Multiple sclerosis: Secondary | ICD-10-CM | POA: Diagnosis not present

## 2022-12-16 DIAGNOSIS — Z466 Encounter for fitting and adjustment of urinary device: Secondary | ICD-10-CM | POA: Diagnosis not present

## 2022-12-16 DIAGNOSIS — J309 Allergic rhinitis, unspecified: Secondary | ICD-10-CM | POA: Diagnosis not present

## 2022-12-16 DIAGNOSIS — D696 Thrombocytopenia, unspecified: Secondary | ICD-10-CM | POA: Diagnosis not present

## 2022-12-16 DIAGNOSIS — I10 Essential (primary) hypertension: Secondary | ICD-10-CM | POA: Diagnosis not present

## 2022-12-16 DIAGNOSIS — N319 Neuromuscular dysfunction of bladder, unspecified: Secondary | ICD-10-CM | POA: Diagnosis not present

## 2022-12-16 DIAGNOSIS — G801 Spastic diplegic cerebral palsy: Secondary | ICD-10-CM | POA: Diagnosis not present

## 2022-12-16 DIAGNOSIS — K589 Irritable bowel syndrome without diarrhea: Secondary | ICD-10-CM | POA: Diagnosis not present

## 2022-12-16 DIAGNOSIS — K8689 Other specified diseases of pancreas: Secondary | ICD-10-CM | POA: Diagnosis not present

## 2022-12-16 DIAGNOSIS — H6123 Impacted cerumen, bilateral: Secondary | ICD-10-CM | POA: Diagnosis not present

## 2022-12-16 DIAGNOSIS — K219 Gastro-esophageal reflux disease without esophagitis: Secondary | ICD-10-CM | POA: Diagnosis not present

## 2022-12-16 DIAGNOSIS — M542 Cervicalgia: Secondary | ICD-10-CM | POA: Diagnosis not present

## 2022-12-16 DIAGNOSIS — I890 Lymphedema, not elsewhere classified: Secondary | ICD-10-CM | POA: Diagnosis not present

## 2022-12-16 DIAGNOSIS — Z981 Arthrodesis status: Secondary | ICD-10-CM | POA: Diagnosis not present

## 2022-12-16 DIAGNOSIS — R1310 Dysphagia, unspecified: Secondary | ICD-10-CM | POA: Diagnosis not present

## 2022-12-16 DIAGNOSIS — D649 Anemia, unspecified: Secondary | ICD-10-CM | POA: Diagnosis not present

## 2022-12-16 DIAGNOSIS — Z853 Personal history of malignant neoplasm of breast: Secondary | ICD-10-CM | POA: Diagnosis not present

## 2022-12-16 DIAGNOSIS — E119 Type 2 diabetes mellitus without complications: Secondary | ICD-10-CM | POA: Diagnosis not present

## 2022-12-16 DIAGNOSIS — G894 Chronic pain syndrome: Secondary | ICD-10-CM | POA: Diagnosis not present

## 2022-12-16 DIAGNOSIS — H900 Conductive hearing loss, bilateral: Secondary | ICD-10-CM | POA: Diagnosis not present

## 2022-12-16 NOTE — Progress Notes (Signed)
Your labs are back Overall your cholesterol looks okay. Your LDL or "bad" cholesterol is a bit elevated - I recommend decreasing saturated fats in your diet and exercising as tolerated to help manage this and we can recheck in 6 months Your A1c was 5.8% which is in the prediabetic range. No medications are indicated at this time but I do recommend reducing your sugar and carb intake and making sure you are exercising regularly Your electrolytes, liver and kidney function were overall normal at this time Your CBC was normal- no signs of anemia Your pancreas testing was normal Your thyroid testing was normal Your vitamin D was in normal range as was your B12 Please let us know if you have further questions or concerns.

## 2022-12-17 DIAGNOSIS — E119 Type 2 diabetes mellitus without complications: Secondary | ICD-10-CM | POA: Diagnosis not present

## 2022-12-17 DIAGNOSIS — D649 Anemia, unspecified: Secondary | ICD-10-CM | POA: Diagnosis not present

## 2022-12-17 DIAGNOSIS — M542 Cervicalgia: Secondary | ICD-10-CM | POA: Diagnosis not present

## 2022-12-17 DIAGNOSIS — K592 Neurogenic bowel, not elsewhere classified: Secondary | ICD-10-CM | POA: Diagnosis not present

## 2022-12-17 DIAGNOSIS — Z466 Encounter for fitting and adjustment of urinary device: Secondary | ICD-10-CM | POA: Diagnosis not present

## 2022-12-17 DIAGNOSIS — I890 Lymphedema, not elsewhere classified: Secondary | ICD-10-CM | POA: Diagnosis not present

## 2022-12-17 DIAGNOSIS — K589 Irritable bowel syndrome without diarrhea: Secondary | ICD-10-CM | POA: Diagnosis not present

## 2022-12-17 DIAGNOSIS — K219 Gastro-esophageal reflux disease without esophagitis: Secondary | ICD-10-CM | POA: Diagnosis not present

## 2022-12-17 DIAGNOSIS — G35 Multiple sclerosis: Secondary | ICD-10-CM | POA: Diagnosis not present

## 2022-12-17 DIAGNOSIS — N319 Neuromuscular dysfunction of bladder, unspecified: Secondary | ICD-10-CM | POA: Diagnosis not present

## 2022-12-17 DIAGNOSIS — I10 Essential (primary) hypertension: Secondary | ICD-10-CM | POA: Diagnosis not present

## 2022-12-17 DIAGNOSIS — H900 Conductive hearing loss, bilateral: Secondary | ICD-10-CM | POA: Diagnosis not present

## 2022-12-17 DIAGNOSIS — G894 Chronic pain syndrome: Secondary | ICD-10-CM | POA: Diagnosis not present

## 2022-12-17 DIAGNOSIS — Z853 Personal history of malignant neoplasm of breast: Secondary | ICD-10-CM | POA: Diagnosis not present

## 2022-12-17 DIAGNOSIS — H6123 Impacted cerumen, bilateral: Secondary | ICD-10-CM | POA: Diagnosis not present

## 2022-12-17 DIAGNOSIS — R1310 Dysphagia, unspecified: Secondary | ICD-10-CM | POA: Diagnosis not present

## 2022-12-17 DIAGNOSIS — D696 Thrombocytopenia, unspecified: Secondary | ICD-10-CM | POA: Diagnosis not present

## 2022-12-17 DIAGNOSIS — G801 Spastic diplegic cerebral palsy: Secondary | ICD-10-CM | POA: Diagnosis not present

## 2022-12-17 DIAGNOSIS — K222 Esophageal obstruction: Secondary | ICD-10-CM | POA: Diagnosis not present

## 2022-12-17 DIAGNOSIS — Z981 Arthrodesis status: Secondary | ICD-10-CM | POA: Diagnosis not present

## 2022-12-17 DIAGNOSIS — J309 Allergic rhinitis, unspecified: Secondary | ICD-10-CM | POA: Diagnosis not present

## 2022-12-17 DIAGNOSIS — K8689 Other specified diseases of pancreas: Secondary | ICD-10-CM | POA: Diagnosis not present

## 2022-12-17 DIAGNOSIS — D709 Neutropenia, unspecified: Secondary | ICD-10-CM | POA: Diagnosis not present

## 2022-12-19 DIAGNOSIS — K592 Neurogenic bowel, not elsewhere classified: Secondary | ICD-10-CM | POA: Diagnosis not present

## 2022-12-19 DIAGNOSIS — Z853 Personal history of malignant neoplasm of breast: Secondary | ICD-10-CM | POA: Diagnosis not present

## 2022-12-19 DIAGNOSIS — R1310 Dysphagia, unspecified: Secondary | ICD-10-CM | POA: Diagnosis not present

## 2022-12-19 DIAGNOSIS — D696 Thrombocytopenia, unspecified: Secondary | ICD-10-CM | POA: Diagnosis not present

## 2022-12-19 DIAGNOSIS — K8689 Other specified diseases of pancreas: Secondary | ICD-10-CM | POA: Diagnosis not present

## 2022-12-19 DIAGNOSIS — D649 Anemia, unspecified: Secondary | ICD-10-CM | POA: Diagnosis not present

## 2022-12-19 DIAGNOSIS — N319 Neuromuscular dysfunction of bladder, unspecified: Secondary | ICD-10-CM | POA: Diagnosis not present

## 2022-12-19 DIAGNOSIS — Z981 Arthrodesis status: Secondary | ICD-10-CM | POA: Diagnosis not present

## 2022-12-19 DIAGNOSIS — D709 Neutropenia, unspecified: Secondary | ICD-10-CM | POA: Diagnosis not present

## 2022-12-19 DIAGNOSIS — Z466 Encounter for fitting and adjustment of urinary device: Secondary | ICD-10-CM | POA: Diagnosis not present

## 2022-12-19 DIAGNOSIS — G801 Spastic diplegic cerebral palsy: Secondary | ICD-10-CM | POA: Diagnosis not present

## 2022-12-19 DIAGNOSIS — K219 Gastro-esophageal reflux disease without esophagitis: Secondary | ICD-10-CM | POA: Diagnosis not present

## 2022-12-19 DIAGNOSIS — E119 Type 2 diabetes mellitus without complications: Secondary | ICD-10-CM | POA: Diagnosis not present

## 2022-12-19 DIAGNOSIS — G35 Multiple sclerosis: Secondary | ICD-10-CM | POA: Diagnosis not present

## 2022-12-19 DIAGNOSIS — I890 Lymphedema, not elsewhere classified: Secondary | ICD-10-CM | POA: Diagnosis not present

## 2022-12-19 DIAGNOSIS — I10 Essential (primary) hypertension: Secondary | ICD-10-CM | POA: Diagnosis not present

## 2022-12-19 DIAGNOSIS — K222 Esophageal obstruction: Secondary | ICD-10-CM | POA: Diagnosis not present

## 2022-12-19 DIAGNOSIS — K589 Irritable bowel syndrome without diarrhea: Secondary | ICD-10-CM | POA: Diagnosis not present

## 2022-12-19 DIAGNOSIS — H6123 Impacted cerumen, bilateral: Secondary | ICD-10-CM | POA: Diagnosis not present

## 2022-12-19 DIAGNOSIS — J309 Allergic rhinitis, unspecified: Secondary | ICD-10-CM | POA: Diagnosis not present

## 2022-12-19 DIAGNOSIS — H900 Conductive hearing loss, bilateral: Secondary | ICD-10-CM | POA: Diagnosis not present

## 2022-12-19 DIAGNOSIS — G894 Chronic pain syndrome: Secondary | ICD-10-CM | POA: Diagnosis not present

## 2022-12-19 DIAGNOSIS — M542 Cervicalgia: Secondary | ICD-10-CM | POA: Diagnosis not present

## 2022-12-20 ENCOUNTER — Telehealth: Payer: Self-pay | Admitting: *Deleted

## 2022-12-20 NOTE — Telephone Encounter (Signed)
Verbal order to change catheter every 3 weeks

## 2022-12-20 NOTE — Telephone Encounter (Signed)
Trey Paula @ [facility] is calling asking for order to change foley cath to 20FR due to catheter clogging. Please advise

## 2022-12-20 NOTE — Telephone Encounter (Signed)
Spoke with Rebecca [name] and he states he is irrigating and so is the patient. Rebecca [name] states the tip of the catheter is clogged and gross amounts of sediment. If the size can not be increased can they change more frequently like every 3 weeks.

## 2022-12-22 DIAGNOSIS — D709 Neutropenia, unspecified: Secondary | ICD-10-CM | POA: Diagnosis not present

## 2022-12-22 DIAGNOSIS — K589 Irritable bowel syndrome without diarrhea: Secondary | ICD-10-CM | POA: Diagnosis not present

## 2022-12-22 DIAGNOSIS — K219 Gastro-esophageal reflux disease without esophagitis: Secondary | ICD-10-CM | POA: Diagnosis not present

## 2022-12-22 DIAGNOSIS — E119 Type 2 diabetes mellitus without complications: Secondary | ICD-10-CM | POA: Diagnosis not present

## 2022-12-22 DIAGNOSIS — J309 Allergic rhinitis, unspecified: Secondary | ICD-10-CM | POA: Diagnosis not present

## 2022-12-22 DIAGNOSIS — K222 Esophageal obstruction: Secondary | ICD-10-CM | POA: Diagnosis not present

## 2022-12-22 DIAGNOSIS — R1310 Dysphagia, unspecified: Secondary | ICD-10-CM | POA: Diagnosis not present

## 2022-12-22 DIAGNOSIS — M542 Cervicalgia: Secondary | ICD-10-CM | POA: Diagnosis not present

## 2022-12-22 DIAGNOSIS — K8689 Other specified diseases of pancreas: Secondary | ICD-10-CM | POA: Diagnosis not present

## 2022-12-22 DIAGNOSIS — I890 Lymphedema, not elsewhere classified: Secondary | ICD-10-CM | POA: Diagnosis not present

## 2022-12-22 DIAGNOSIS — I10 Essential (primary) hypertension: Secondary | ICD-10-CM | POA: Diagnosis not present

## 2022-12-22 DIAGNOSIS — Z466 Encounter for fitting and adjustment of urinary device: Secondary | ICD-10-CM | POA: Diagnosis not present

## 2022-12-22 DIAGNOSIS — G801 Spastic diplegic cerebral palsy: Secondary | ICD-10-CM | POA: Diagnosis not present

## 2022-12-22 DIAGNOSIS — Z981 Arthrodesis status: Secondary | ICD-10-CM | POA: Diagnosis not present

## 2022-12-22 DIAGNOSIS — Z853 Personal history of malignant neoplasm of breast: Secondary | ICD-10-CM | POA: Diagnosis not present

## 2022-12-22 DIAGNOSIS — G894 Chronic pain syndrome: Secondary | ICD-10-CM | POA: Diagnosis not present

## 2022-12-22 DIAGNOSIS — H6123 Impacted cerumen, bilateral: Secondary | ICD-10-CM | POA: Diagnosis not present

## 2022-12-22 DIAGNOSIS — D649 Anemia, unspecified: Secondary | ICD-10-CM | POA: Diagnosis not present

## 2022-12-22 DIAGNOSIS — N319 Neuromuscular dysfunction of bladder, unspecified: Secondary | ICD-10-CM | POA: Diagnosis not present

## 2022-12-22 DIAGNOSIS — K592 Neurogenic bowel, not elsewhere classified: Secondary | ICD-10-CM | POA: Diagnosis not present

## 2022-12-22 DIAGNOSIS — D696 Thrombocytopenia, unspecified: Secondary | ICD-10-CM | POA: Diagnosis not present

## 2022-12-22 DIAGNOSIS — G35 Multiple sclerosis: Secondary | ICD-10-CM | POA: Diagnosis not present

## 2022-12-22 DIAGNOSIS — H900 Conductive hearing loss, bilateral: Secondary | ICD-10-CM | POA: Diagnosis not present

## 2022-12-26 DIAGNOSIS — G35 Multiple sclerosis: Secondary | ICD-10-CM | POA: Diagnosis not present

## 2022-12-26 DIAGNOSIS — H900 Conductive hearing loss, bilateral: Secondary | ICD-10-CM | POA: Diagnosis not present

## 2022-12-26 DIAGNOSIS — I10 Essential (primary) hypertension: Secondary | ICD-10-CM | POA: Diagnosis not present

## 2022-12-26 DIAGNOSIS — K592 Neurogenic bowel, not elsewhere classified: Secondary | ICD-10-CM | POA: Diagnosis not present

## 2022-12-26 DIAGNOSIS — E119 Type 2 diabetes mellitus without complications: Secondary | ICD-10-CM | POA: Diagnosis not present

## 2022-12-26 DIAGNOSIS — D709 Neutropenia, unspecified: Secondary | ICD-10-CM | POA: Diagnosis not present

## 2022-12-26 DIAGNOSIS — Z981 Arthrodesis status: Secondary | ICD-10-CM | POA: Diagnosis not present

## 2022-12-26 DIAGNOSIS — R1310 Dysphagia, unspecified: Secondary | ICD-10-CM | POA: Diagnosis not present

## 2022-12-26 DIAGNOSIS — G894 Chronic pain syndrome: Secondary | ICD-10-CM | POA: Diagnosis not present

## 2022-12-26 DIAGNOSIS — K222 Esophageal obstruction: Secondary | ICD-10-CM | POA: Diagnosis not present

## 2022-12-26 DIAGNOSIS — K219 Gastro-esophageal reflux disease without esophagitis: Secondary | ICD-10-CM | POA: Diagnosis not present

## 2022-12-26 DIAGNOSIS — G801 Spastic diplegic cerebral palsy: Secondary | ICD-10-CM | POA: Diagnosis not present

## 2022-12-26 DIAGNOSIS — K8689 Other specified diseases of pancreas: Secondary | ICD-10-CM | POA: Diagnosis not present

## 2022-12-26 DIAGNOSIS — D649 Anemia, unspecified: Secondary | ICD-10-CM | POA: Diagnosis not present

## 2022-12-26 DIAGNOSIS — Z466 Encounter for fitting and adjustment of urinary device: Secondary | ICD-10-CM | POA: Diagnosis not present

## 2022-12-26 DIAGNOSIS — N319 Neuromuscular dysfunction of bladder, unspecified: Secondary | ICD-10-CM | POA: Diagnosis not present

## 2022-12-26 DIAGNOSIS — M542 Cervicalgia: Secondary | ICD-10-CM | POA: Diagnosis not present

## 2022-12-26 DIAGNOSIS — D696 Thrombocytopenia, unspecified: Secondary | ICD-10-CM | POA: Diagnosis not present

## 2022-12-26 DIAGNOSIS — K589 Irritable bowel syndrome without diarrhea: Secondary | ICD-10-CM | POA: Diagnosis not present

## 2022-12-26 DIAGNOSIS — Z853 Personal history of malignant neoplasm of breast: Secondary | ICD-10-CM | POA: Diagnosis not present

## 2022-12-26 DIAGNOSIS — H6123 Impacted cerumen, bilateral: Secondary | ICD-10-CM | POA: Diagnosis not present

## 2022-12-26 DIAGNOSIS — I890 Lymphedema, not elsewhere classified: Secondary | ICD-10-CM | POA: Diagnosis not present

## 2022-12-26 DIAGNOSIS — J309 Allergic rhinitis, unspecified: Secondary | ICD-10-CM | POA: Diagnosis not present

## 2023-01-03 DIAGNOSIS — D709 Neutropenia, unspecified: Secondary | ICD-10-CM | POA: Diagnosis not present

## 2023-01-03 DIAGNOSIS — Z981 Arthrodesis status: Secondary | ICD-10-CM | POA: Diagnosis not present

## 2023-01-03 DIAGNOSIS — J309 Allergic rhinitis, unspecified: Secondary | ICD-10-CM | POA: Diagnosis not present

## 2023-01-03 DIAGNOSIS — I10 Essential (primary) hypertension: Secondary | ICD-10-CM | POA: Diagnosis not present

## 2023-01-03 DIAGNOSIS — K592 Neurogenic bowel, not elsewhere classified: Secondary | ICD-10-CM | POA: Diagnosis not present

## 2023-01-03 DIAGNOSIS — H900 Conductive hearing loss, bilateral: Secondary | ICD-10-CM | POA: Diagnosis not present

## 2023-01-03 DIAGNOSIS — G894 Chronic pain syndrome: Secondary | ICD-10-CM | POA: Diagnosis not present

## 2023-01-03 DIAGNOSIS — H6123 Impacted cerumen, bilateral: Secondary | ICD-10-CM | POA: Diagnosis not present

## 2023-01-03 DIAGNOSIS — R1310 Dysphagia, unspecified: Secondary | ICD-10-CM | POA: Diagnosis not present

## 2023-01-03 DIAGNOSIS — I890 Lymphedema, not elsewhere classified: Secondary | ICD-10-CM | POA: Diagnosis not present

## 2023-01-03 DIAGNOSIS — G801 Spastic diplegic cerebral palsy: Secondary | ICD-10-CM | POA: Diagnosis not present

## 2023-01-03 DIAGNOSIS — E119 Type 2 diabetes mellitus without complications: Secondary | ICD-10-CM | POA: Diagnosis not present

## 2023-01-03 DIAGNOSIS — D649 Anemia, unspecified: Secondary | ICD-10-CM | POA: Diagnosis not present

## 2023-01-03 DIAGNOSIS — K8689 Other specified diseases of pancreas: Secondary | ICD-10-CM | POA: Diagnosis not present

## 2023-01-03 DIAGNOSIS — M542 Cervicalgia: Secondary | ICD-10-CM | POA: Diagnosis not present

## 2023-01-03 DIAGNOSIS — N319 Neuromuscular dysfunction of bladder, unspecified: Secondary | ICD-10-CM | POA: Diagnosis not present

## 2023-01-03 DIAGNOSIS — K219 Gastro-esophageal reflux disease without esophagitis: Secondary | ICD-10-CM | POA: Diagnosis not present

## 2023-01-03 DIAGNOSIS — Z853 Personal history of malignant neoplasm of breast: Secondary | ICD-10-CM | POA: Diagnosis not present

## 2023-01-03 DIAGNOSIS — Z466 Encounter for fitting and adjustment of urinary device: Secondary | ICD-10-CM | POA: Diagnosis not present

## 2023-01-03 DIAGNOSIS — K589 Irritable bowel syndrome without diarrhea: Secondary | ICD-10-CM | POA: Diagnosis not present

## 2023-01-03 DIAGNOSIS — G35 Multiple sclerosis: Secondary | ICD-10-CM | POA: Diagnosis not present

## 2023-01-03 DIAGNOSIS — K222 Esophageal obstruction: Secondary | ICD-10-CM | POA: Diagnosis not present

## 2023-01-03 DIAGNOSIS — D696 Thrombocytopenia, unspecified: Secondary | ICD-10-CM | POA: Diagnosis not present

## 2023-01-12 DIAGNOSIS — K222 Esophageal obstruction: Secondary | ICD-10-CM | POA: Diagnosis not present

## 2023-01-12 DIAGNOSIS — H900 Conductive hearing loss, bilateral: Secondary | ICD-10-CM | POA: Diagnosis not present

## 2023-01-12 DIAGNOSIS — D709 Neutropenia, unspecified: Secondary | ICD-10-CM | POA: Diagnosis not present

## 2023-01-12 DIAGNOSIS — E119 Type 2 diabetes mellitus without complications: Secondary | ICD-10-CM | POA: Diagnosis not present

## 2023-01-12 DIAGNOSIS — K219 Gastro-esophageal reflux disease without esophagitis: Secondary | ICD-10-CM | POA: Diagnosis not present

## 2023-01-12 DIAGNOSIS — G35 Multiple sclerosis: Secondary | ICD-10-CM | POA: Diagnosis not present

## 2023-01-12 DIAGNOSIS — Z853 Personal history of malignant neoplasm of breast: Secondary | ICD-10-CM | POA: Diagnosis not present

## 2023-01-12 DIAGNOSIS — H6123 Impacted cerumen, bilateral: Secondary | ICD-10-CM | POA: Diagnosis not present

## 2023-01-12 DIAGNOSIS — D696 Thrombocytopenia, unspecified: Secondary | ICD-10-CM | POA: Diagnosis not present

## 2023-01-12 DIAGNOSIS — I10 Essential (primary) hypertension: Secondary | ICD-10-CM | POA: Diagnosis not present

## 2023-01-12 DIAGNOSIS — Z466 Encounter for fitting and adjustment of urinary device: Secondary | ICD-10-CM | POA: Diagnosis not present

## 2023-01-12 DIAGNOSIS — K592 Neurogenic bowel, not elsewhere classified: Secondary | ICD-10-CM | POA: Diagnosis not present

## 2023-01-12 DIAGNOSIS — M542 Cervicalgia: Secondary | ICD-10-CM | POA: Diagnosis not present

## 2023-01-12 DIAGNOSIS — I890 Lymphedema, not elsewhere classified: Secondary | ICD-10-CM | POA: Diagnosis not present

## 2023-01-12 DIAGNOSIS — G894 Chronic pain syndrome: Secondary | ICD-10-CM | POA: Diagnosis not present

## 2023-01-12 DIAGNOSIS — Z981 Arthrodesis status: Secondary | ICD-10-CM | POA: Diagnosis not present

## 2023-01-12 DIAGNOSIS — J309 Allergic rhinitis, unspecified: Secondary | ICD-10-CM | POA: Diagnosis not present

## 2023-01-12 DIAGNOSIS — N319 Neuromuscular dysfunction of bladder, unspecified: Secondary | ICD-10-CM | POA: Diagnosis not present

## 2023-01-12 DIAGNOSIS — D649 Anemia, unspecified: Secondary | ICD-10-CM | POA: Diagnosis not present

## 2023-01-12 DIAGNOSIS — R1310 Dysphagia, unspecified: Secondary | ICD-10-CM | POA: Diagnosis not present

## 2023-01-12 DIAGNOSIS — K589 Irritable bowel syndrome without diarrhea: Secondary | ICD-10-CM | POA: Diagnosis not present

## 2023-01-12 DIAGNOSIS — G801 Spastic diplegic cerebral palsy: Secondary | ICD-10-CM | POA: Diagnosis not present

## 2023-01-12 DIAGNOSIS — K8689 Other specified diseases of pancreas: Secondary | ICD-10-CM | POA: Diagnosis not present

## 2023-01-14 ENCOUNTER — Other Ambulatory Visit: Payer: Self-pay | Admitting: Family Medicine

## 2023-01-14 DIAGNOSIS — Z760 Encounter for issue of repeat prescription: Secondary | ICD-10-CM

## 2023-01-14 DIAGNOSIS — R262 Difficulty in walking, not elsewhere classified: Secondary | ICD-10-CM | POA: Diagnosis not present

## 2023-01-14 DIAGNOSIS — K219 Gastro-esophageal reflux disease without esophagitis: Secondary | ICD-10-CM

## 2023-01-14 DIAGNOSIS — M6281 Muscle weakness (generalized): Secondary | ICD-10-CM | POA: Diagnosis not present

## 2023-01-14 DIAGNOSIS — L03116 Cellulitis of left lower limb: Secondary | ICD-10-CM | POA: Diagnosis not present

## 2023-01-15 DIAGNOSIS — Z466 Encounter for fitting and adjustment of urinary device: Secondary | ICD-10-CM | POA: Diagnosis not present

## 2023-01-15 DIAGNOSIS — K222 Esophageal obstruction: Secondary | ICD-10-CM | POA: Diagnosis not present

## 2023-01-15 DIAGNOSIS — Z981 Arthrodesis status: Secondary | ICD-10-CM | POA: Diagnosis not present

## 2023-01-15 DIAGNOSIS — K8689 Other specified diseases of pancreas: Secondary | ICD-10-CM | POA: Diagnosis not present

## 2023-01-15 DIAGNOSIS — N319 Neuromuscular dysfunction of bladder, unspecified: Secondary | ICD-10-CM | POA: Diagnosis not present

## 2023-01-15 DIAGNOSIS — D696 Thrombocytopenia, unspecified: Secondary | ICD-10-CM | POA: Diagnosis not present

## 2023-01-15 DIAGNOSIS — D709 Neutropenia, unspecified: Secondary | ICD-10-CM | POA: Diagnosis not present

## 2023-01-15 DIAGNOSIS — K592 Neurogenic bowel, not elsewhere classified: Secondary | ICD-10-CM | POA: Diagnosis not present

## 2023-01-15 DIAGNOSIS — E119 Type 2 diabetes mellitus without complications: Secondary | ICD-10-CM | POA: Diagnosis not present

## 2023-01-15 DIAGNOSIS — G35 Multiple sclerosis: Secondary | ICD-10-CM | POA: Diagnosis not present

## 2023-01-15 DIAGNOSIS — I10 Essential (primary) hypertension: Secondary | ICD-10-CM | POA: Diagnosis not present

## 2023-01-15 DIAGNOSIS — G894 Chronic pain syndrome: Secondary | ICD-10-CM | POA: Diagnosis not present

## 2023-01-15 DIAGNOSIS — M542 Cervicalgia: Secondary | ICD-10-CM | POA: Diagnosis not present

## 2023-01-15 DIAGNOSIS — R1310 Dysphagia, unspecified: Secondary | ICD-10-CM | POA: Diagnosis not present

## 2023-01-15 DIAGNOSIS — H900 Conductive hearing loss, bilateral: Secondary | ICD-10-CM | POA: Diagnosis not present

## 2023-01-15 DIAGNOSIS — J309 Allergic rhinitis, unspecified: Secondary | ICD-10-CM | POA: Diagnosis not present

## 2023-01-15 DIAGNOSIS — Z853 Personal history of malignant neoplasm of breast: Secondary | ICD-10-CM | POA: Diagnosis not present

## 2023-01-15 DIAGNOSIS — K219 Gastro-esophageal reflux disease without esophagitis: Secondary | ICD-10-CM | POA: Diagnosis not present

## 2023-01-15 DIAGNOSIS — K589 Irritable bowel syndrome without diarrhea: Secondary | ICD-10-CM | POA: Diagnosis not present

## 2023-01-15 DIAGNOSIS — H6123 Impacted cerumen, bilateral: Secondary | ICD-10-CM | POA: Diagnosis not present

## 2023-01-15 DIAGNOSIS — I890 Lymphedema, not elsewhere classified: Secondary | ICD-10-CM | POA: Diagnosis not present

## 2023-01-15 DIAGNOSIS — G801 Spastic diplegic cerebral palsy: Secondary | ICD-10-CM | POA: Diagnosis not present

## 2023-01-15 DIAGNOSIS — D649 Anemia, unspecified: Secondary | ICD-10-CM | POA: Diagnosis not present

## 2023-01-16 DIAGNOSIS — Z760 Encounter for issue of repeat prescription: Secondary | ICD-10-CM

## 2023-01-16 DIAGNOSIS — N319 Neuromuscular dysfunction of bladder, unspecified: Secondary | ICD-10-CM

## 2023-01-16 MED ORDER — OXYBUTYNIN CHLORIDE ER 10 MG PO TB24
10.0000 mg | ORAL_TABLET | Freq: Two times a day (BID) | ORAL | 3 refills | Status: DC
Start: 2023-01-16 — End: 2023-08-03

## 2023-01-17 DIAGNOSIS — Z981 Arthrodesis status: Secondary | ICD-10-CM | POA: Diagnosis not present

## 2023-01-17 DIAGNOSIS — G801 Spastic diplegic cerebral palsy: Secondary | ICD-10-CM | POA: Diagnosis not present

## 2023-01-17 DIAGNOSIS — K592 Neurogenic bowel, not elsewhere classified: Secondary | ICD-10-CM | POA: Diagnosis not present

## 2023-01-17 DIAGNOSIS — K219 Gastro-esophageal reflux disease without esophagitis: Secondary | ICD-10-CM | POA: Diagnosis not present

## 2023-01-17 DIAGNOSIS — H6123 Impacted cerumen, bilateral: Secondary | ICD-10-CM | POA: Diagnosis not present

## 2023-01-17 DIAGNOSIS — R1310 Dysphagia, unspecified: Secondary | ICD-10-CM | POA: Diagnosis not present

## 2023-01-17 DIAGNOSIS — D649 Anemia, unspecified: Secondary | ICD-10-CM | POA: Diagnosis not present

## 2023-01-17 DIAGNOSIS — D709 Neutropenia, unspecified: Secondary | ICD-10-CM | POA: Diagnosis not present

## 2023-01-17 DIAGNOSIS — D696 Thrombocytopenia, unspecified: Secondary | ICD-10-CM | POA: Diagnosis not present

## 2023-01-17 DIAGNOSIS — K8689 Other specified diseases of pancreas: Secondary | ICD-10-CM | POA: Diagnosis not present

## 2023-01-17 DIAGNOSIS — G35 Multiple sclerosis: Secondary | ICD-10-CM | POA: Diagnosis not present

## 2023-01-17 DIAGNOSIS — J309 Allergic rhinitis, unspecified: Secondary | ICD-10-CM | POA: Diagnosis not present

## 2023-01-17 DIAGNOSIS — I890 Lymphedema, not elsewhere classified: Secondary | ICD-10-CM | POA: Diagnosis not present

## 2023-01-17 DIAGNOSIS — M542 Cervicalgia: Secondary | ICD-10-CM | POA: Diagnosis not present

## 2023-01-17 DIAGNOSIS — G894 Chronic pain syndrome: Secondary | ICD-10-CM | POA: Diagnosis not present

## 2023-01-17 DIAGNOSIS — K222 Esophageal obstruction: Secondary | ICD-10-CM | POA: Diagnosis not present

## 2023-01-17 DIAGNOSIS — N319 Neuromuscular dysfunction of bladder, unspecified: Secondary | ICD-10-CM | POA: Diagnosis not present

## 2023-01-17 DIAGNOSIS — H900 Conductive hearing loss, bilateral: Secondary | ICD-10-CM | POA: Diagnosis not present

## 2023-01-17 DIAGNOSIS — I10 Essential (primary) hypertension: Secondary | ICD-10-CM | POA: Diagnosis not present

## 2023-01-17 DIAGNOSIS — Z466 Encounter for fitting and adjustment of urinary device: Secondary | ICD-10-CM | POA: Diagnosis not present

## 2023-01-17 DIAGNOSIS — K589 Irritable bowel syndrome without diarrhea: Secondary | ICD-10-CM | POA: Diagnosis not present

## 2023-01-17 DIAGNOSIS — Z853 Personal history of malignant neoplasm of breast: Secondary | ICD-10-CM | POA: Diagnosis not present

## 2023-01-17 DIAGNOSIS — E119 Type 2 diabetes mellitus without complications: Secondary | ICD-10-CM | POA: Diagnosis not present

## 2023-01-20 ENCOUNTER — Inpatient Hospital Stay: Payer: 59

## 2023-01-24 DIAGNOSIS — D696 Thrombocytopenia, unspecified: Secondary | ICD-10-CM | POA: Diagnosis not present

## 2023-01-24 DIAGNOSIS — Z981 Arthrodesis status: Secondary | ICD-10-CM | POA: Diagnosis not present

## 2023-01-24 DIAGNOSIS — D649 Anemia, unspecified: Secondary | ICD-10-CM | POA: Diagnosis not present

## 2023-01-24 DIAGNOSIS — G894 Chronic pain syndrome: Secondary | ICD-10-CM | POA: Diagnosis not present

## 2023-01-24 DIAGNOSIS — E119 Type 2 diabetes mellitus without complications: Secondary | ICD-10-CM | POA: Diagnosis not present

## 2023-01-24 DIAGNOSIS — R1310 Dysphagia, unspecified: Secondary | ICD-10-CM | POA: Diagnosis not present

## 2023-01-24 DIAGNOSIS — M542 Cervicalgia: Secondary | ICD-10-CM | POA: Diagnosis not present

## 2023-01-24 DIAGNOSIS — G801 Spastic diplegic cerebral palsy: Secondary | ICD-10-CM | POA: Diagnosis not present

## 2023-01-24 DIAGNOSIS — K219 Gastro-esophageal reflux disease without esophagitis: Secondary | ICD-10-CM | POA: Diagnosis not present

## 2023-01-24 DIAGNOSIS — K8689 Other specified diseases of pancreas: Secondary | ICD-10-CM | POA: Diagnosis not present

## 2023-01-24 DIAGNOSIS — K222 Esophageal obstruction: Secondary | ICD-10-CM | POA: Diagnosis not present

## 2023-01-24 DIAGNOSIS — I10 Essential (primary) hypertension: Secondary | ICD-10-CM | POA: Diagnosis not present

## 2023-01-24 DIAGNOSIS — I890 Lymphedema, not elsewhere classified: Secondary | ICD-10-CM | POA: Diagnosis not present

## 2023-01-24 DIAGNOSIS — Z466 Encounter for fitting and adjustment of urinary device: Secondary | ICD-10-CM | POA: Diagnosis not present

## 2023-01-24 DIAGNOSIS — G35 Multiple sclerosis: Secondary | ICD-10-CM | POA: Diagnosis not present

## 2023-01-24 DIAGNOSIS — Z853 Personal history of malignant neoplasm of breast: Secondary | ICD-10-CM | POA: Diagnosis not present

## 2023-01-24 DIAGNOSIS — H6123 Impacted cerumen, bilateral: Secondary | ICD-10-CM | POA: Diagnosis not present

## 2023-01-24 DIAGNOSIS — K592 Neurogenic bowel, not elsewhere classified: Secondary | ICD-10-CM | POA: Diagnosis not present

## 2023-01-24 DIAGNOSIS — H900 Conductive hearing loss, bilateral: Secondary | ICD-10-CM | POA: Diagnosis not present

## 2023-01-24 DIAGNOSIS — K589 Irritable bowel syndrome without diarrhea: Secondary | ICD-10-CM | POA: Diagnosis not present

## 2023-01-24 DIAGNOSIS — J309 Allergic rhinitis, unspecified: Secondary | ICD-10-CM | POA: Diagnosis not present

## 2023-01-24 DIAGNOSIS — N319 Neuromuscular dysfunction of bladder, unspecified: Secondary | ICD-10-CM | POA: Diagnosis not present

## 2023-01-24 DIAGNOSIS — D709 Neutropenia, unspecified: Secondary | ICD-10-CM | POA: Diagnosis not present

## 2023-01-25 ENCOUNTER — Telehealth: Payer: Self-pay | Admitting: Family Medicine

## 2023-01-25 DIAGNOSIS — K589 Irritable bowel syndrome without diarrhea: Secondary | ICD-10-CM | POA: Diagnosis not present

## 2023-01-25 DIAGNOSIS — K592 Neurogenic bowel, not elsewhere classified: Secondary | ICD-10-CM | POA: Diagnosis not present

## 2023-01-25 DIAGNOSIS — Z466 Encounter for fitting and adjustment of urinary device: Secondary | ICD-10-CM | POA: Diagnosis not present

## 2023-01-25 DIAGNOSIS — Z981 Arthrodesis status: Secondary | ICD-10-CM | POA: Diagnosis not present

## 2023-01-25 DIAGNOSIS — G801 Spastic diplegic cerebral palsy: Secondary | ICD-10-CM | POA: Diagnosis not present

## 2023-01-25 DIAGNOSIS — D709 Neutropenia, unspecified: Secondary | ICD-10-CM | POA: Diagnosis not present

## 2023-01-25 DIAGNOSIS — D649 Anemia, unspecified: Secondary | ICD-10-CM | POA: Diagnosis not present

## 2023-01-25 DIAGNOSIS — Z853 Personal history of malignant neoplasm of breast: Secondary | ICD-10-CM | POA: Diagnosis not present

## 2023-01-25 DIAGNOSIS — I10 Essential (primary) hypertension: Secondary | ICD-10-CM | POA: Diagnosis not present

## 2023-01-25 DIAGNOSIS — K222 Esophageal obstruction: Secondary | ICD-10-CM | POA: Diagnosis not present

## 2023-01-25 DIAGNOSIS — G35 Multiple sclerosis: Secondary | ICD-10-CM | POA: Diagnosis not present

## 2023-01-25 DIAGNOSIS — D696 Thrombocytopenia, unspecified: Secondary | ICD-10-CM | POA: Diagnosis not present

## 2023-01-25 DIAGNOSIS — K8689 Other specified diseases of pancreas: Secondary | ICD-10-CM | POA: Diagnosis not present

## 2023-01-25 DIAGNOSIS — N319 Neuromuscular dysfunction of bladder, unspecified: Secondary | ICD-10-CM | POA: Diagnosis not present

## 2023-01-25 DIAGNOSIS — K219 Gastro-esophageal reflux disease without esophagitis: Secondary | ICD-10-CM | POA: Diagnosis not present

## 2023-01-25 DIAGNOSIS — J309 Allergic rhinitis, unspecified: Secondary | ICD-10-CM | POA: Diagnosis not present

## 2023-01-25 DIAGNOSIS — G894 Chronic pain syndrome: Secondary | ICD-10-CM | POA: Diagnosis not present

## 2023-01-25 DIAGNOSIS — I890 Lymphedema, not elsewhere classified: Secondary | ICD-10-CM | POA: Diagnosis not present

## 2023-01-25 DIAGNOSIS — M542 Cervicalgia: Secondary | ICD-10-CM | POA: Diagnosis not present

## 2023-01-25 DIAGNOSIS — E119 Type 2 diabetes mellitus without complications: Secondary | ICD-10-CM | POA: Diagnosis not present

## 2023-01-25 DIAGNOSIS — H6123 Impacted cerumen, bilateral: Secondary | ICD-10-CM | POA: Diagnosis not present

## 2023-01-25 DIAGNOSIS — H900 Conductive hearing loss, bilateral: Secondary | ICD-10-CM | POA: Diagnosis not present

## 2023-01-25 DIAGNOSIS — R1310 Dysphagia, unspecified: Secondary | ICD-10-CM | POA: Diagnosis not present

## 2023-01-25 NOTE — Telephone Encounter (Signed)
Caller/Agency: Shireen with [facility]  Callback Number: [phone] secure vm Service Requested: Occupational Therapy Frequency: extension of service every other week for 8 weeks Any new concerns about the patient? No

## 2023-01-26 ENCOUNTER — Telehealth: Payer: Self-pay | Admitting: Family Medicine

## 2023-01-26 NOTE — Telephone Encounter (Signed)
Home Health Verbal Orders - Caller/Agency: Phu from [facility] Number: [phone] Service Requested: Physical Therapy Frequency: 1x9 Any new concerns about the patient? no

## 2023-01-26 NOTE — Telephone Encounter (Signed)
Verbal orders given  

## 2023-01-29 DIAGNOSIS — J309 Allergic rhinitis, unspecified: Secondary | ICD-10-CM | POA: Diagnosis not present

## 2023-01-29 DIAGNOSIS — D696 Thrombocytopenia, unspecified: Secondary | ICD-10-CM | POA: Diagnosis not present

## 2023-01-29 DIAGNOSIS — G35 Multiple sclerosis: Secondary | ICD-10-CM | POA: Diagnosis not present

## 2023-01-29 DIAGNOSIS — D649 Anemia, unspecified: Secondary | ICD-10-CM | POA: Diagnosis not present

## 2023-01-29 DIAGNOSIS — I10 Essential (primary) hypertension: Secondary | ICD-10-CM | POA: Diagnosis not present

## 2023-01-29 DIAGNOSIS — K222 Esophageal obstruction: Secondary | ICD-10-CM | POA: Diagnosis not present

## 2023-01-29 DIAGNOSIS — H6123 Impacted cerumen, bilateral: Secondary | ICD-10-CM | POA: Diagnosis not present

## 2023-01-29 DIAGNOSIS — G801 Spastic diplegic cerebral palsy: Secondary | ICD-10-CM | POA: Diagnosis not present

## 2023-01-29 DIAGNOSIS — Z466 Encounter for fitting and adjustment of urinary device: Secondary | ICD-10-CM | POA: Diagnosis not present

## 2023-01-29 DIAGNOSIS — N319 Neuromuscular dysfunction of bladder, unspecified: Secondary | ICD-10-CM | POA: Diagnosis not present

## 2023-01-29 DIAGNOSIS — D709 Neutropenia, unspecified: Secondary | ICD-10-CM | POA: Diagnosis not present

## 2023-01-29 DIAGNOSIS — K592 Neurogenic bowel, not elsewhere classified: Secondary | ICD-10-CM | POA: Diagnosis not present

## 2023-01-29 DIAGNOSIS — M542 Cervicalgia: Secondary | ICD-10-CM | POA: Diagnosis not present

## 2023-01-29 DIAGNOSIS — Z853 Personal history of malignant neoplasm of breast: Secondary | ICD-10-CM | POA: Diagnosis not present

## 2023-01-29 DIAGNOSIS — K589 Irritable bowel syndrome without diarrhea: Secondary | ICD-10-CM | POA: Diagnosis not present

## 2023-01-29 DIAGNOSIS — Z981 Arthrodesis status: Secondary | ICD-10-CM | POA: Diagnosis not present

## 2023-01-29 DIAGNOSIS — H900 Conductive hearing loss, bilateral: Secondary | ICD-10-CM | POA: Diagnosis not present

## 2023-01-29 DIAGNOSIS — K219 Gastro-esophageal reflux disease without esophagitis: Secondary | ICD-10-CM | POA: Diagnosis not present

## 2023-01-29 DIAGNOSIS — I890 Lymphedema, not elsewhere classified: Secondary | ICD-10-CM | POA: Diagnosis not present

## 2023-01-29 DIAGNOSIS — K8689 Other specified diseases of pancreas: Secondary | ICD-10-CM | POA: Diagnosis not present

## 2023-01-29 DIAGNOSIS — R1310 Dysphagia, unspecified: Secondary | ICD-10-CM | POA: Diagnosis not present

## 2023-01-29 DIAGNOSIS — G894 Chronic pain syndrome: Secondary | ICD-10-CM | POA: Diagnosis not present

## 2023-01-29 DIAGNOSIS — E119 Type 2 diabetes mellitus without complications: Secondary | ICD-10-CM | POA: Diagnosis not present

## 2023-01-31 DIAGNOSIS — H6123 Impacted cerumen, bilateral: Secondary | ICD-10-CM | POA: Diagnosis not present

## 2023-01-31 DIAGNOSIS — G894 Chronic pain syndrome: Secondary | ICD-10-CM | POA: Diagnosis not present

## 2023-01-31 DIAGNOSIS — G801 Spastic diplegic cerebral palsy: Secondary | ICD-10-CM | POA: Diagnosis not present

## 2023-01-31 DIAGNOSIS — I10 Essential (primary) hypertension: Secondary | ICD-10-CM | POA: Diagnosis not present

## 2023-01-31 DIAGNOSIS — E119 Type 2 diabetes mellitus without complications: Secondary | ICD-10-CM | POA: Diagnosis not present

## 2023-01-31 DIAGNOSIS — J309 Allergic rhinitis, unspecified: Secondary | ICD-10-CM | POA: Diagnosis not present

## 2023-01-31 DIAGNOSIS — K219 Gastro-esophageal reflux disease without esophagitis: Secondary | ICD-10-CM | POA: Diagnosis not present

## 2023-01-31 DIAGNOSIS — D649 Anemia, unspecified: Secondary | ICD-10-CM | POA: Diagnosis not present

## 2023-01-31 DIAGNOSIS — G35 Multiple sclerosis: Secondary | ICD-10-CM | POA: Diagnosis not present

## 2023-01-31 DIAGNOSIS — N319 Neuromuscular dysfunction of bladder, unspecified: Secondary | ICD-10-CM | POA: Diagnosis not present

## 2023-01-31 DIAGNOSIS — D709 Neutropenia, unspecified: Secondary | ICD-10-CM | POA: Diagnosis not present

## 2023-01-31 DIAGNOSIS — K589 Irritable bowel syndrome without diarrhea: Secondary | ICD-10-CM | POA: Diagnosis not present

## 2023-01-31 DIAGNOSIS — Z853 Personal history of malignant neoplasm of breast: Secondary | ICD-10-CM | POA: Diagnosis not present

## 2023-01-31 DIAGNOSIS — I890 Lymphedema, not elsewhere classified: Secondary | ICD-10-CM | POA: Diagnosis not present

## 2023-01-31 DIAGNOSIS — K8689 Other specified diseases of pancreas: Secondary | ICD-10-CM | POA: Diagnosis not present

## 2023-01-31 DIAGNOSIS — Z466 Encounter for fitting and adjustment of urinary device: Secondary | ICD-10-CM | POA: Diagnosis not present

## 2023-01-31 DIAGNOSIS — K222 Esophageal obstruction: Secondary | ICD-10-CM | POA: Diagnosis not present

## 2023-01-31 DIAGNOSIS — D696 Thrombocytopenia, unspecified: Secondary | ICD-10-CM | POA: Diagnosis not present

## 2023-01-31 DIAGNOSIS — Z981 Arthrodesis status: Secondary | ICD-10-CM | POA: Diagnosis not present

## 2023-01-31 DIAGNOSIS — M542 Cervicalgia: Secondary | ICD-10-CM | POA: Diagnosis not present

## 2023-01-31 DIAGNOSIS — H900 Conductive hearing loss, bilateral: Secondary | ICD-10-CM | POA: Diagnosis not present

## 2023-01-31 DIAGNOSIS — R1310 Dysphagia, unspecified: Secondary | ICD-10-CM | POA: Diagnosis not present

## 2023-01-31 DIAGNOSIS — K592 Neurogenic bowel, not elsewhere classified: Secondary | ICD-10-CM | POA: Diagnosis not present

## 2023-02-06 ENCOUNTER — Inpatient Hospital Stay: Payer: 59

## 2023-02-07 DIAGNOSIS — Z466 Encounter for fitting and adjustment of urinary device: Secondary | ICD-10-CM | POA: Diagnosis not present

## 2023-02-07 DIAGNOSIS — D649 Anemia, unspecified: Secondary | ICD-10-CM | POA: Diagnosis not present

## 2023-02-07 DIAGNOSIS — R1310 Dysphagia, unspecified: Secondary | ICD-10-CM | POA: Diagnosis not present

## 2023-02-07 DIAGNOSIS — H6123 Impacted cerumen, bilateral: Secondary | ICD-10-CM | POA: Diagnosis not present

## 2023-02-07 DIAGNOSIS — I10 Essential (primary) hypertension: Secondary | ICD-10-CM | POA: Diagnosis not present

## 2023-02-07 DIAGNOSIS — K219 Gastro-esophageal reflux disease without esophagitis: Secondary | ICD-10-CM | POA: Diagnosis not present

## 2023-02-07 DIAGNOSIS — H900 Conductive hearing loss, bilateral: Secondary | ICD-10-CM | POA: Diagnosis not present

## 2023-02-07 DIAGNOSIS — E119 Type 2 diabetes mellitus without complications: Secondary | ICD-10-CM | POA: Diagnosis not present

## 2023-02-07 DIAGNOSIS — G35 Multiple sclerosis: Secondary | ICD-10-CM | POA: Diagnosis not present

## 2023-02-07 DIAGNOSIS — D696 Thrombocytopenia, unspecified: Secondary | ICD-10-CM | POA: Diagnosis not present

## 2023-02-07 DIAGNOSIS — M542 Cervicalgia: Secondary | ICD-10-CM | POA: Diagnosis not present

## 2023-02-07 DIAGNOSIS — G894 Chronic pain syndrome: Secondary | ICD-10-CM | POA: Diagnosis not present

## 2023-02-07 DIAGNOSIS — K592 Neurogenic bowel, not elsewhere classified: Secondary | ICD-10-CM | POA: Diagnosis not present

## 2023-02-07 DIAGNOSIS — I890 Lymphedema, not elsewhere classified: Secondary | ICD-10-CM | POA: Diagnosis not present

## 2023-02-07 DIAGNOSIS — K222 Esophageal obstruction: Secondary | ICD-10-CM | POA: Diagnosis not present

## 2023-02-07 DIAGNOSIS — D709 Neutropenia, unspecified: Secondary | ICD-10-CM | POA: Diagnosis not present

## 2023-02-07 DIAGNOSIS — J309 Allergic rhinitis, unspecified: Secondary | ICD-10-CM | POA: Diagnosis not present

## 2023-02-07 DIAGNOSIS — N319 Neuromuscular dysfunction of bladder, unspecified: Secondary | ICD-10-CM | POA: Diagnosis not present

## 2023-02-07 DIAGNOSIS — K8689 Other specified diseases of pancreas: Secondary | ICD-10-CM | POA: Diagnosis not present

## 2023-02-07 DIAGNOSIS — G801 Spastic diplegic cerebral palsy: Secondary | ICD-10-CM | POA: Diagnosis not present

## 2023-02-07 DIAGNOSIS — K589 Irritable bowel syndrome without diarrhea: Secondary | ICD-10-CM | POA: Diagnosis not present

## 2023-02-07 DIAGNOSIS — Z981 Arthrodesis status: Secondary | ICD-10-CM | POA: Diagnosis not present

## 2023-02-07 DIAGNOSIS — Z853 Personal history of malignant neoplasm of breast: Secondary | ICD-10-CM | POA: Diagnosis not present

## 2023-02-10 DIAGNOSIS — I10 Essential (primary) hypertension: Secondary | ICD-10-CM | POA: Diagnosis not present

## 2023-02-10 DIAGNOSIS — D696 Thrombocytopenia, unspecified: Secondary | ICD-10-CM | POA: Diagnosis not present

## 2023-02-10 DIAGNOSIS — K589 Irritable bowel syndrome without diarrhea: Secondary | ICD-10-CM | POA: Diagnosis not present

## 2023-02-10 DIAGNOSIS — E119 Type 2 diabetes mellitus without complications: Secondary | ICD-10-CM | POA: Diagnosis not present

## 2023-02-10 DIAGNOSIS — K222 Esophageal obstruction: Secondary | ICD-10-CM | POA: Diagnosis not present

## 2023-02-10 DIAGNOSIS — D649 Anemia, unspecified: Secondary | ICD-10-CM | POA: Diagnosis not present

## 2023-02-10 DIAGNOSIS — R1310 Dysphagia, unspecified: Secondary | ICD-10-CM | POA: Diagnosis not present

## 2023-02-10 DIAGNOSIS — Z981 Arthrodesis status: Secondary | ICD-10-CM | POA: Diagnosis not present

## 2023-02-10 DIAGNOSIS — K8689 Other specified diseases of pancreas: Secondary | ICD-10-CM | POA: Diagnosis not present

## 2023-02-10 DIAGNOSIS — H900 Conductive hearing loss, bilateral: Secondary | ICD-10-CM | POA: Diagnosis not present

## 2023-02-10 DIAGNOSIS — K592 Neurogenic bowel, not elsewhere classified: Secondary | ICD-10-CM | POA: Diagnosis not present

## 2023-02-10 DIAGNOSIS — H6123 Impacted cerumen, bilateral: Secondary | ICD-10-CM | POA: Diagnosis not present

## 2023-02-10 DIAGNOSIS — K219 Gastro-esophageal reflux disease without esophagitis: Secondary | ICD-10-CM | POA: Diagnosis not present

## 2023-02-10 DIAGNOSIS — Z466 Encounter for fitting and adjustment of urinary device: Secondary | ICD-10-CM | POA: Diagnosis not present

## 2023-02-10 DIAGNOSIS — G801 Spastic diplegic cerebral palsy: Secondary | ICD-10-CM | POA: Diagnosis not present

## 2023-02-10 DIAGNOSIS — I890 Lymphedema, not elsewhere classified: Secondary | ICD-10-CM | POA: Diagnosis not present

## 2023-02-10 DIAGNOSIS — Z853 Personal history of malignant neoplasm of breast: Secondary | ICD-10-CM | POA: Diagnosis not present

## 2023-02-10 DIAGNOSIS — G894 Chronic pain syndrome: Secondary | ICD-10-CM | POA: Diagnosis not present

## 2023-02-10 DIAGNOSIS — N319 Neuromuscular dysfunction of bladder, unspecified: Secondary | ICD-10-CM | POA: Diagnosis not present

## 2023-02-10 DIAGNOSIS — M542 Cervicalgia: Secondary | ICD-10-CM | POA: Diagnosis not present

## 2023-02-10 DIAGNOSIS — D709 Neutropenia, unspecified: Secondary | ICD-10-CM | POA: Diagnosis not present

## 2023-02-10 DIAGNOSIS — G35 Multiple sclerosis: Secondary | ICD-10-CM | POA: Diagnosis not present

## 2023-02-10 DIAGNOSIS — J309 Allergic rhinitis, unspecified: Secondary | ICD-10-CM | POA: Diagnosis not present

## 2023-02-13 ENCOUNTER — Inpatient Hospital Stay: Payer: 59 | Attending: Oncology

## 2023-02-13 DIAGNOSIS — Z743 Need for continuous supervision: Secondary | ICD-10-CM | POA: Diagnosis not present

## 2023-02-13 DIAGNOSIS — R279 Unspecified lack of coordination: Secondary | ICD-10-CM | POA: Diagnosis not present

## 2023-02-13 DIAGNOSIS — R531 Weakness: Secondary | ICD-10-CM | POA: Diagnosis not present

## 2023-02-13 DIAGNOSIS — Z452 Encounter for adjustment and management of vascular access device: Secondary | ICD-10-CM | POA: Insufficient documentation

## 2023-02-13 DIAGNOSIS — Z853 Personal history of malignant neoplasm of breast: Secondary | ICD-10-CM | POA: Insufficient documentation

## 2023-02-13 DIAGNOSIS — Z95828 Presence of other vascular implants and grafts: Secondary | ICD-10-CM

## 2023-02-13 MED ORDER — HEPARIN SOD (PORK) LOCK FLUSH 100 UNIT/ML IV SOLN
500.0000 [IU] | Freq: Once | INTRAVENOUS | Status: AC
  Administered 2023-02-13: 500 [IU] via INTRAVENOUS
  Filled 2023-02-13: qty 5

## 2023-02-13 MED ORDER — SODIUM CHLORIDE 0.9% FLUSH
10.0000 mL | Freq: Once | INTRAVENOUS | Status: AC
  Administered 2023-02-13: 10 mL via INTRAVENOUS
  Filled 2023-02-13: qty 10

## 2023-02-14 DIAGNOSIS — Z981 Arthrodesis status: Secondary | ICD-10-CM | POA: Diagnosis not present

## 2023-02-14 DIAGNOSIS — K589 Irritable bowel syndrome without diarrhea: Secondary | ICD-10-CM | POA: Diagnosis not present

## 2023-02-14 DIAGNOSIS — Z466 Encounter for fitting and adjustment of urinary device: Secondary | ICD-10-CM | POA: Diagnosis not present

## 2023-02-14 DIAGNOSIS — H900 Conductive hearing loss, bilateral: Secondary | ICD-10-CM | POA: Diagnosis not present

## 2023-02-14 DIAGNOSIS — K8689 Other specified diseases of pancreas: Secondary | ICD-10-CM | POA: Diagnosis not present

## 2023-02-14 DIAGNOSIS — M6281 Muscle weakness (generalized): Secondary | ICD-10-CM | POA: Diagnosis not present

## 2023-02-14 DIAGNOSIS — K219 Gastro-esophageal reflux disease without esophagitis: Secondary | ICD-10-CM | POA: Diagnosis not present

## 2023-02-14 DIAGNOSIS — E119 Type 2 diabetes mellitus without complications: Secondary | ICD-10-CM | POA: Diagnosis not present

## 2023-02-14 DIAGNOSIS — D696 Thrombocytopenia, unspecified: Secondary | ICD-10-CM | POA: Diagnosis not present

## 2023-02-14 DIAGNOSIS — R1310 Dysphagia, unspecified: Secondary | ICD-10-CM | POA: Diagnosis not present

## 2023-02-14 DIAGNOSIS — G894 Chronic pain syndrome: Secondary | ICD-10-CM | POA: Diagnosis not present

## 2023-02-14 DIAGNOSIS — K222 Esophageal obstruction: Secondary | ICD-10-CM | POA: Diagnosis not present

## 2023-02-14 DIAGNOSIS — H6123 Impacted cerumen, bilateral: Secondary | ICD-10-CM | POA: Diagnosis not present

## 2023-02-14 DIAGNOSIS — K592 Neurogenic bowel, not elsewhere classified: Secondary | ICD-10-CM | POA: Diagnosis not present

## 2023-02-14 DIAGNOSIS — I10 Essential (primary) hypertension: Secondary | ICD-10-CM | POA: Diagnosis not present

## 2023-02-14 DIAGNOSIS — G801 Spastic diplegic cerebral palsy: Secondary | ICD-10-CM | POA: Diagnosis not present

## 2023-02-14 DIAGNOSIS — G35 Multiple sclerosis: Secondary | ICD-10-CM | POA: Diagnosis not present

## 2023-02-14 DIAGNOSIS — R262 Difficulty in walking, not elsewhere classified: Secondary | ICD-10-CM | POA: Diagnosis not present

## 2023-02-14 DIAGNOSIS — D649 Anemia, unspecified: Secondary | ICD-10-CM | POA: Diagnosis not present

## 2023-02-14 DIAGNOSIS — L03116 Cellulitis of left lower limb: Secondary | ICD-10-CM | POA: Diagnosis not present

## 2023-02-14 DIAGNOSIS — M542 Cervicalgia: Secondary | ICD-10-CM | POA: Diagnosis not present

## 2023-02-14 DIAGNOSIS — Z853 Personal history of malignant neoplasm of breast: Secondary | ICD-10-CM | POA: Diagnosis not present

## 2023-02-14 DIAGNOSIS — I890 Lymphedema, not elsewhere classified: Secondary | ICD-10-CM | POA: Diagnosis not present

## 2023-02-14 DIAGNOSIS — J309 Allergic rhinitis, unspecified: Secondary | ICD-10-CM | POA: Diagnosis not present

## 2023-02-14 DIAGNOSIS — N319 Neuromuscular dysfunction of bladder, unspecified: Secondary | ICD-10-CM | POA: Diagnosis not present

## 2023-02-14 DIAGNOSIS — D709 Neutropenia, unspecified: Secondary | ICD-10-CM | POA: Diagnosis not present

## 2023-02-20 ENCOUNTER — Encounter: Payer: Self-pay | Admitting: Nurse Practitioner

## 2023-02-20 ENCOUNTER — Telehealth (INDEPENDENT_AMBULATORY_CARE_PROVIDER_SITE_OTHER): Payer: 59 | Admitting: Nurse Practitioner

## 2023-02-20 ENCOUNTER — Ambulatory Visit: Payer: Self-pay | Admitting: *Deleted

## 2023-02-20 DIAGNOSIS — R197 Diarrhea, unspecified: Secondary | ICD-10-CM | POA: Diagnosis not present

## 2023-02-20 NOTE — Telephone Encounter (Signed)
  Chief Complaint: diarrhea- uncontrolled Symptoms: increased gas, diarrhea- uncontrolled Frequency: 3 days Pertinent Negatives: Patient denies fever, abdominal pain, vomiting Disposition: [] ED /[] Urgent Care (no appt availability in office) / [x] Appointment(In office/virtual)/ []  [facility]/ [] Home Care/ [] Refused Recommended Disposition /[] [facility]/ []  Follow-up with PCP Additional Notes: Patient is at high risk- unable to clean immediately- wheel chair bound.  Virtual appointment scheduled.

## 2023-02-20 NOTE — Progress Notes (Signed)
 Name: Rebecca [name]   MRN: [id number]    DOB: 11/03/1960   Date:02/20/2023       Progress Note  Subjective  Chief Complaint  Chief Complaint  Patient presents with   Diarrhea    X3 days    I connected with  Rebecca [name]  on 02/20/23 at 11:20 AM EST by a video enabled telemedicine application and verified that I am speaking with the correct person using two identifiers.  I discussed the limitations of evaluation and management by telemedicine and the availability of in person appointments. The patient expressed understanding and agreed to proceed with a virtual visit  Staff also discussed with the patient that there may be a patient responsible charge related to this service. Patient Location: home Provider Location: [facility] Additional Individuals present: alone  HPI  Discussed the use of AI scribe software for clinical note transcription with the patient, who gave verbal consent to proceed.  History of Present Illness   The patient presents with a three-day history of diarrhea. She describes the stool as 'real thick and gassy' and denies the presence of blood. The diarrhea is not associated with fever, vomiting, or abnormal vital signs. denies any recent antibiotic use. patient denies any recent travel.  The patient has been maintaining hydration with Propel water and has been able to tolerate chicken, rice and crackers. Other foods, including soup, exacerbate the diarrhea. The patient has not yet tried Imodium, which she has on hand. She has a home health nurse assisting with clean-up after episodes of incontinence.    Patient Active Problem List   Diagnosis Date Noted   Pancreatic insufficiency 12/07/2022   Conductive hearing loss, bilateral 06/29/2021   Tibia/fibula fracture, left, closed, initial encounter 12/08/2020   Dysphagia    Acute esophagitis    Gastric erythema    Mucosal abnormality of duodenum    Schatzki's ring    Diarrhea 04/14/2020   Elevated BP without diagnosis  of hypertension 04/14/2020   Neuromuscular dysfunction of bladder, unspecified 03/27/2020   Acquired absence of unspecified breast and nipple 02/08/2020   History of falling 02/08/2020   Personal history of urinary (tract) infections 02/08/2020   Muscle spasm 02/28/2019   Post-menopausal bleeding 11/20/2018   Ambulatory dysfunction 02/02/2018   Abnormality of gait 11/17/2017   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   Bilateral lower extremity pain (primary) (bilateral) (right greater than left) 08/01/2016   Chronic neck pain (secondary) (bilateral) ( left greater than right) 07/28/2016   Neutropenia (HCC) 06/27/2016   Chronic pain syndrome 06/03/2016   Neurogenic bladder    Neurogenic bowel    Detrusor and sphincter dyssynergia    Urinary incontinence    Seizure disorder (HCC)    Slow transit constipation    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HCC)    Lymphedema    History of breast cancer in female 10/28/2015   Anemia 10/28/2015   Thrombocytopenia (HCC) 10/28/2015   Allergic rhinitis 10/28/2015   IBS (irritable bowel syndrome) 10/28/2015   Uterine leiomyoma 10/24/2014   History of right mastectomy 10/24/2014   MS (multiple sclerosis) (HCC) 09/16/2014   Primary cancer of right female breast (HCC) 01/10/2014   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 My mom smoked. Passed 04/18/20. Dad smoked, quit 1973. Sister smokes.  Substance Use Topics   Alcohol use:  No     Current Outpatient Medications:    amantadine  (SYMMETREL ) 100 MG capsule, TAKE 1 CAPSULE BY MOUTH THREE TIMES A DAY, Disp: 270 capsule, Rfl: 1   baclofen  (LIORESAL ) 10 MG tablet, Take 1-2 tablets (10-20 mg total) by mouth 3 (three) times daily., Disp: 120 tablet, Rfl: 12   gluconic acid-citric acid (RENACIDIN ) irrigation, Insert 30 mL into suprapubic tube and clamp tube for  10 minutes and then drain twice daily, Disp: 500 mL, Rfl: 1   Glucosamine-Chondroitin (COSAMIN DS PO), Take 1 tablet by mouth 5 (five) times daily., Disp: , Rfl:    levETIRAcetam  (KEPPRA ) 500 MG tablet, Take 1 tablet (500 mg total) by mouth 2 (two) times daily., Disp: 180 tablet, Rfl: 4   lidocaine -prilocaine  (EMLA ) cream, Apply 1 application topically as needed., Disp: 30 g, Rfl: 0   lipase/protease/amylase (CREON ) 36000 UNITS CPEP capsule, Take 2 capsules (72,000 Units total) by mouth 3 (three) times daily with meals AND 1 capsule (36,000 Units total) with snacks., Disp: 270 capsule, Rfl: 11   loratadine  (CLARITIN ) 10 MG tablet, Take 10 mg by mouth daily., Disp: , Rfl:    Misc Natural Products (LEG VEIN & CIRCULATION) TABS, Take 1 tablet by mouth 2 (two) times daily. , Disp: , Rfl:    Multiple Vitamins-Minerals (HAIR SKIN AND NAILS FORMULA) TABS, Take 1 tablet by mouth 2 (two) times daily. , Disp: , Rfl:    Multiple Vitamins-Minerals (MULTIVITAMIN PO), Take 1 tablet by mouth daily. , Disp: , Rfl:    nystatin  (MYCOSTATIN /NYSTOP ) powder, Apply 1 application topically 3 (three) times daily as needed. For rash/raw skin as needed, Disp: 15 g, Rfl: 2   omeprazole  (PRILOSEC) 40 MG capsule, TAKE 1 CAPSULE (40 MG TOTAL) BY MOUTH DAILY., Disp: 90 capsule, Rfl: 1   oxybutynin  (DITROPAN -XL) 10 MG 24 hr tablet, Take 1 tablet (10 mg total) by mouth 2 (two) times daily., Disp: 120 tablet, Rfl: 3   REBIF  REBIDOSE 44 MCG/0.5ML SOAJ, INJECT 1 PEN UNDER THE SKIN 3 TIMES PER WEEK, Disp: 44 mL, Rfl: 2   tiZANidine  (ZANAFLEX ) 4 MG tablet, Take 1 tablet (4 mg total) by mouth at bedtime., Disp: 90 tablet, Rfl: 4   Turmeric 500 MG CAPS, Take 500 mg by mouth daily., Disp: , Rfl:   Allergies  Allergen Reactions   Fentanyl  Nausea And Vomiting and Nausea Only    vomiting Was given in PACU x3 each time patient got sick    Sulfa Antibiotics Hives and Other (See Comments)    Light headed, over heated    I personally  reviewed active problem list, medication list, allergies, notes from last encounter with the patient/caregiver today.  ROS  Ten systems reviewed and is negative except as mentioned in HPI   Objective  Virtual encounter, vitals not obtained.  There is no height or weight on file to calculate BMI.  Nursing Note and Vital Signs reviewed.  Physical Exam  Awake, alert and oriented, speaking in complete sentences   No results found. However, due to the size of the patient record, not all encounters were searched. Please check Results Review for a complete set of results.  Assessment & Plan  Problem List Items Addressed This Visit       Other   Diarrhea - Primary   Assessment and Plan    Diarrhea Ongoing for three days, no associated fever, vomiting, or blood in stool. No recent antibiotic use. Patient has been maintaining hydration and has home health assistance. -Start  Imodium, two initially then one after each loose stool. -If no improvement in the next couple of days, notify the clinic for possible stool culture.       Keep pushing fluids and following a bland diet as tolerated.   -Red flags and when to present for emergency care or RTC including fever >101.34F, chest pain, shortness of breath, new/worsening/un-resolving symptoms,  reviewed with patient at time of visit. Follow up and care instructions discussed and provided in AVS. - I discussed the assessment and treatment plan with the patient. The patient was provided an opportunity to ask questions and all were answered. The patient agreed with the plan and demonstrated an understanding of the instructions.  I provided 15 minutes of non-face-to-face time during this encounter.  Mliss JULIANNA Spray, FNP

## 2023-02-20 NOTE — Telephone Encounter (Signed)
 Reason for Disposition  [1] SEVERE diarrhea (e.g., 7 or more times / day more than normal) AND [2] age > 60 years  Answer Assessment - Initial Assessment Questions 1. DIARRHEA SEVERITY: How bad is the diarrhea? How many more stools have you had in the past 24 hours than normal?    - NO DIARRHEA (SCALE 0)   - MILD (SCALE 1-3): Few loose or mushy BMs; increase of 1-3 stools over normal daily number of stools; mild increase in ostomy output.   -  MODERATE (SCALE 4-7): Increase of 4-6 stools daily over normal; moderate increase in ostomy output.   -  SEVERE (SCALE 8-10; OR WORST POSSIBLE): Increase of 7 or more stools daily over normal; moderate increase in ostomy output; incontinence.     Severe- no warnoing 2. ONSET: When did the diarrhea begin?      3 days 3. BM CONSISTENCY: How loose or watery is the diarrhea?      Loose- patient is experiencing gas 4. VOMITING: Are you also vomiting? If Yes, ask: How many times in the past 24 hours?      no 5. ABDOMEN PAIN: Are you having any abdomen pain? If Yes, ask: What does it feel like? (e.g., crampy, dull, intermittent, constant)      no 6. ABDOMEN PAIN SEVERITY: If present, ask: How bad is the pain?  (e.g., Scale 1-10; mild, moderate, or severe)   - MILD (1-3): doesn't interfere with normal activities, abdomen soft and not tender to touch    - MODERATE (4-7): interferes with normal activities or awakens from sleep, abdomen tender to touch    - SEVERE (8-10): excruciating pain, doubled over, unable to do any normal activities       no 7. ORAL INTAKE: If vomiting, Have you been able to drink liquids? How much liquids have you had in the past 24 hours?     Ginger ale, Gatorade,rice  8. HYDRATION: Any signs of dehydration? (e.g., dry mouth [not just dry lips], too weak to stand, dizziness, new weight loss) When did you last urinate?     no 9. EXPOSURE: Have you traveled to a foreign country recently? Have you been  exposed to anyone with diarrhea? Could you have eaten any food that was spoiled?     no 10. ANTIBIOTIC USE: Are you taking antibiotics now or have you taken antibiotics in the past 2 months?       no 11. OTHER SYMPTOMS: Do you have any other symptoms? (e.g., fever, blood in stool)       Increased gas. all vitals normal  Protocols used: Diarrhea-A-AH

## 2023-02-21 DIAGNOSIS — Z981 Arthrodesis status: Secondary | ICD-10-CM | POA: Diagnosis not present

## 2023-02-21 DIAGNOSIS — K8689 Other specified diseases of pancreas: Secondary | ICD-10-CM | POA: Diagnosis not present

## 2023-02-21 DIAGNOSIS — K592 Neurogenic bowel, not elsewhere classified: Secondary | ICD-10-CM | POA: Diagnosis not present

## 2023-02-21 DIAGNOSIS — H900 Conductive hearing loss, bilateral: Secondary | ICD-10-CM | POA: Diagnosis not present

## 2023-02-21 DIAGNOSIS — G801 Spastic diplegic cerebral palsy: Secondary | ICD-10-CM | POA: Diagnosis not present

## 2023-02-21 DIAGNOSIS — N319 Neuromuscular dysfunction of bladder, unspecified: Secondary | ICD-10-CM | POA: Diagnosis not present

## 2023-02-21 DIAGNOSIS — R1310 Dysphagia, unspecified: Secondary | ICD-10-CM | POA: Diagnosis not present

## 2023-02-21 DIAGNOSIS — J309 Allergic rhinitis, unspecified: Secondary | ICD-10-CM | POA: Diagnosis not present

## 2023-02-21 DIAGNOSIS — D696 Thrombocytopenia, unspecified: Secondary | ICD-10-CM | POA: Diagnosis not present

## 2023-02-21 DIAGNOSIS — G35 Multiple sclerosis: Secondary | ICD-10-CM | POA: Diagnosis not present

## 2023-02-21 DIAGNOSIS — D709 Neutropenia, unspecified: Secondary | ICD-10-CM | POA: Diagnosis not present

## 2023-02-21 DIAGNOSIS — Z466 Encounter for fitting and adjustment of urinary device: Secondary | ICD-10-CM | POA: Diagnosis not present

## 2023-02-21 DIAGNOSIS — K222 Esophageal obstruction: Secondary | ICD-10-CM | POA: Diagnosis not present

## 2023-02-21 DIAGNOSIS — H6123 Impacted cerumen, bilateral: Secondary | ICD-10-CM | POA: Diagnosis not present

## 2023-02-21 DIAGNOSIS — K219 Gastro-esophageal reflux disease without esophagitis: Secondary | ICD-10-CM | POA: Diagnosis not present

## 2023-02-21 DIAGNOSIS — D649 Anemia, unspecified: Secondary | ICD-10-CM | POA: Diagnosis not present

## 2023-02-21 DIAGNOSIS — I10 Essential (primary) hypertension: Secondary | ICD-10-CM | POA: Diagnosis not present

## 2023-02-21 DIAGNOSIS — G894 Chronic pain syndrome: Secondary | ICD-10-CM | POA: Diagnosis not present

## 2023-02-21 DIAGNOSIS — K589 Irritable bowel syndrome without diarrhea: Secondary | ICD-10-CM | POA: Diagnosis not present

## 2023-02-21 DIAGNOSIS — I890 Lymphedema, not elsewhere classified: Secondary | ICD-10-CM | POA: Diagnosis not present

## 2023-02-21 DIAGNOSIS — E119 Type 2 diabetes mellitus without complications: Secondary | ICD-10-CM | POA: Diagnosis not present

## 2023-02-21 DIAGNOSIS — Z853 Personal history of malignant neoplasm of breast: Secondary | ICD-10-CM | POA: Diagnosis not present

## 2023-02-21 DIAGNOSIS — M542 Cervicalgia: Secondary | ICD-10-CM | POA: Diagnosis not present

## 2023-02-27 DIAGNOSIS — N319 Neuromuscular dysfunction of bladder, unspecified: Secondary | ICD-10-CM | POA: Diagnosis not present

## 2023-03-08 ENCOUNTER — Inpatient Hospital Stay
Admission: EM | Admit: 2023-03-08 | Discharge: 2023-03-13 | DRG: 698 | Disposition: A | Discharge status: Home Health | Payer: 59 | Attending: Internal Medicine | Admitting: Internal Medicine

## 2023-03-08 ENCOUNTER — Emergency Department: Payer: 59

## 2023-03-08 DIAGNOSIS — G35 Multiple sclerosis: Secondary | ICD-10-CM | POA: Diagnosis not present

## 2023-03-08 DIAGNOSIS — A419 Sepsis, unspecified organism: Secondary | ICD-10-CM | POA: Diagnosis present

## 2023-03-08 DIAGNOSIS — G40909 Epilepsy, unspecified, not intractable, without status epilepticus: Secondary | ICD-10-CM | POA: Diagnosis not present

## 2023-03-08 DIAGNOSIS — R109 Unspecified abdominal pain: Secondary | ICD-10-CM | POA: Diagnosis not present

## 2023-03-08 DIAGNOSIS — I890 Lymphedema, not elsewhere classified: Secondary | ICD-10-CM | POA: Diagnosis present

## 2023-03-08 DIAGNOSIS — I5032 Chronic diastolic (congestive) heart failure: Secondary | ICD-10-CM | POA: Diagnosis not present

## 2023-03-08 DIAGNOSIS — N319 Neuromuscular dysfunction of bladder, unspecified: Secondary | ICD-10-CM | POA: Diagnosis present

## 2023-03-08 DIAGNOSIS — G801 Spastic diplegic cerebral palsy: Secondary | ICD-10-CM | POA: Diagnosis not present

## 2023-03-08 DIAGNOSIS — Z79899 Other long term (current) drug therapy: Secondary | ICD-10-CM | POA: Diagnosis not present

## 2023-03-08 DIAGNOSIS — R197 Diarrhea, unspecified: Secondary | ICD-10-CM | POA: Diagnosis present

## 2023-03-08 DIAGNOSIS — R000 Tachycardia, unspecified: Secondary | ICD-10-CM | POA: Diagnosis not present

## 2023-03-08 DIAGNOSIS — D709 Neutropenia, unspecified: Secondary | ICD-10-CM | POA: Diagnosis not present

## 2023-03-08 DIAGNOSIS — R918 Other nonspecific abnormal finding of lung field: Secondary | ICD-10-CM | POA: Diagnosis not present

## 2023-03-08 DIAGNOSIS — Z981 Arthrodesis status: Secondary | ICD-10-CM

## 2023-03-08 DIAGNOSIS — R509 Fever, unspecified: Secondary | ICD-10-CM | POA: Diagnosis not present

## 2023-03-08 DIAGNOSIS — Z8041 Family history of malignant neoplasm of ovary: Secondary | ICD-10-CM

## 2023-03-08 DIAGNOSIS — Z882 Allergy status to sulfonamides status: Secondary | ICD-10-CM

## 2023-03-08 DIAGNOSIS — H6123 Impacted cerumen, bilateral: Secondary | ICD-10-CM | POA: Diagnosis not present

## 2023-03-08 DIAGNOSIS — N12 Tubulo-interstitial nephritis, not specified as acute or chronic: Secondary | ICD-10-CM | POA: Diagnosis not present

## 2023-03-08 DIAGNOSIS — A4151 Sepsis due to Escherichia coli [E. coli]: Secondary | ICD-10-CM | POA: Diagnosis not present

## 2023-03-08 DIAGNOSIS — M542 Cervicalgia: Secondary | ICD-10-CM | POA: Diagnosis not present

## 2023-03-08 DIAGNOSIS — R7989 Other specified abnormal findings of blood chemistry: Secondary | ICD-10-CM | POA: Diagnosis present

## 2023-03-08 DIAGNOSIS — G894 Chronic pain syndrome: Secondary | ICD-10-CM | POA: Diagnosis not present

## 2023-03-08 DIAGNOSIS — Z8249 Family history of ischemic heart disease and other diseases of the circulatory system: Secondary | ICD-10-CM

## 2023-03-08 DIAGNOSIS — Z1152 Encounter for screening for COVID-19: Secondary | ICD-10-CM

## 2023-03-08 DIAGNOSIS — D649 Anemia, unspecified: Secondary | ICD-10-CM | POA: Diagnosis not present

## 2023-03-08 DIAGNOSIS — I10 Essential (primary) hypertension: Secondary | ICD-10-CM | POA: Diagnosis not present

## 2023-03-08 DIAGNOSIS — E871 Hypo-osmolality and hyponatremia: Secondary | ICD-10-CM | POA: Diagnosis present

## 2023-03-08 DIAGNOSIS — H900 Conductive hearing loss, bilateral: Secondary | ICD-10-CM | POA: Diagnosis not present

## 2023-03-08 DIAGNOSIS — K219 Gastro-esophageal reflux disease without esophagitis: Secondary | ICD-10-CM | POA: Diagnosis present

## 2023-03-08 DIAGNOSIS — Z1612 Extended spectrum beta lactamase (ESBL) resistance: Secondary | ICD-10-CM | POA: Diagnosis present

## 2023-03-08 DIAGNOSIS — T83511A Infection and inflammatory reaction due to indwelling urethral catheter, initial encounter: Principal | ICD-10-CM | POA: Diagnosis present

## 2023-03-08 DIAGNOSIS — K222 Esophageal obstruction: Secondary | ICD-10-CM | POA: Diagnosis not present

## 2023-03-08 DIAGNOSIS — Y846 Urinary catheterization as the cause of abnormal reaction of the patient, or of later complication, without mention of misadventure at the time of the procedure: Secondary | ICD-10-CM | POA: Diagnosis present

## 2023-03-08 DIAGNOSIS — Z853 Personal history of malignant neoplasm of breast: Secondary | ICD-10-CM | POA: Diagnosis not present

## 2023-03-08 DIAGNOSIS — I359 Nonrheumatic aortic valve disorder, unspecified: Secondary | ICD-10-CM | POA: Diagnosis not present

## 2023-03-08 DIAGNOSIS — G8220 Paraplegia, unspecified: Secondary | ICD-10-CM | POA: Diagnosis present

## 2023-03-08 DIAGNOSIS — R69 Illness, unspecified: Secondary | ICD-10-CM | POA: Diagnosis not present

## 2023-03-08 DIAGNOSIS — Z7401 Bed confinement status: Secondary | ICD-10-CM | POA: Diagnosis not present

## 2023-03-08 DIAGNOSIS — E119 Type 2 diabetes mellitus without complications: Secondary | ICD-10-CM | POA: Diagnosis not present

## 2023-03-08 DIAGNOSIS — Z466 Encounter for fitting and adjustment of urinary device: Secondary | ICD-10-CM | POA: Diagnosis not present

## 2023-03-08 DIAGNOSIS — K8689 Other specified diseases of pancreas: Secondary | ICD-10-CM | POA: Diagnosis present

## 2023-03-08 DIAGNOSIS — Z803 Family history of malignant neoplasm of breast: Secondary | ICD-10-CM

## 2023-03-08 DIAGNOSIS — R1310 Dysphagia, unspecified: Secondary | ICD-10-CM | POA: Diagnosis not present

## 2023-03-08 DIAGNOSIS — J309 Allergic rhinitis, unspecified: Secondary | ICD-10-CM | POA: Diagnosis not present

## 2023-03-08 DIAGNOSIS — Z743 Need for continuous supervision: Secondary | ICD-10-CM | POA: Diagnosis not present

## 2023-03-08 DIAGNOSIS — I493 Ventricular premature depolarization: Secondary | ICD-10-CM | POA: Diagnosis present

## 2023-03-08 DIAGNOSIS — D696 Thrombocytopenia, unspecified: Secondary | ICD-10-CM | POA: Diagnosis not present

## 2023-03-08 DIAGNOSIS — K589 Irritable bowel syndrome without diarrhea: Secondary | ICD-10-CM | POA: Diagnosis not present

## 2023-03-08 DIAGNOSIS — Z9011 Acquired absence of right breast and nipple: Secondary | ICD-10-CM

## 2023-03-08 DIAGNOSIS — R0689 Other abnormalities of breathing: Secondary | ICD-10-CM | POA: Diagnosis not present

## 2023-03-08 DIAGNOSIS — G40209 Localization-related (focal) (partial) symptomatic epilepsy and epileptic syndromes with complex partial seizures, not intractable, without status epilepticus: Secondary | ICD-10-CM | POA: Diagnosis not present

## 2023-03-08 DIAGNOSIS — E876 Hypokalemia: Secondary | ICD-10-CM | POA: Diagnosis not present

## 2023-03-08 DIAGNOSIS — Z17421 Hormone receptor negative with human epidermal growth factor receptor 2 negative status: Secondary | ICD-10-CM

## 2023-03-08 DIAGNOSIS — K449 Diaphragmatic hernia without obstruction or gangrene: Secondary | ICD-10-CM | POA: Diagnosis not present

## 2023-03-08 DIAGNOSIS — K592 Neurogenic bowel, not elsewhere classified: Secondary | ICD-10-CM | POA: Diagnosis not present

## 2023-03-08 DIAGNOSIS — Z8349 Family history of other endocrine, nutritional and metabolic diseases: Secondary | ICD-10-CM

## 2023-03-08 DIAGNOSIS — N10 Acute tubulo-interstitial nephritis: Secondary | ICD-10-CM | POA: Diagnosis present

## 2023-03-08 LAB — COMPREHENSIVE METABOLIC PANEL
ALT: 45 U/L — ABNORMAL HIGH (ref 0–44)
AST: 45 U/L — ABNORMAL HIGH (ref 15–41)
Albumin: 3.2 g/dL — ABNORMAL LOW (ref 3.5–5.0)
Alkaline Phosphatase: 84 U/L (ref 38–126)
Anion gap: 13 (ref 5–15)
BUN: 26 mg/dL — ABNORMAL HIGH (ref 8–23)
CO2: 19 mmol/L — ABNORMAL LOW (ref 22–32)
Calcium: 8.7 mg/dL — ABNORMAL LOW (ref 8.9–10.3)
Chloride: 96 mmol/L — ABNORMAL LOW (ref 98–111)
Creatinine, Ser: 0.69 mg/dL (ref 0.44–1.00)
GFR, Estimated: >60 mL/min (ref >60)
Glucose, Bld: 140 mg/dL — ABNORMAL HIGH (ref 70–99)
Potassium: 3.1 mmol/L — ABNORMAL LOW (ref 3.5–5.1)
Sodium: 128 mmol/L — ABNORMAL LOW (ref 135–145)
Total Bilirubin: 1.3 mg/dL — ABNORMAL HIGH (ref 0.0–1.2)
Total Protein: 7.6 g/dL (ref 6.5–8.1)

## 2023-03-08 LAB — RESP PANEL BY RT-PCR (RSV, FLU A&B, COVID)  RVPGX2
Influenza A by PCR: NEGATIVE
Influenza B by PCR: NEGATIVE
Resp Syncytial Virus by PCR: NEGATIVE
SARS Coronavirus 2 by RT PCR: NEGATIVE

## 2023-03-08 LAB — CBC WITH DIFFERENTIAL/PLATELET
Abs Immature Granulocytes: 0.08 10*3/uL — ABNORMAL HIGH (ref 0.00–0.07)
Basophils Absolute: 0 10*3/uL (ref 0.0–0.1)
Basophils Relative: 0 %
Eosinophils Absolute: 0 10*3/uL (ref 0.0–0.5)
Eosinophils Relative: 0 %
HCT: 39.2 % (ref 36.0–46.0)
Hemoglobin: 13.6 g/dL (ref 12.0–15.0)
Immature Granulocytes: 1 %
Lymphocytes Relative: 7 %
Lymphs Abs: 1 10*3/uL (ref 0.7–4.0)
MCH: 29.7 pg (ref 26.0–34.0)
MCHC: 34.7 g/dL (ref 30.0–36.0)
MCV: 85.6 fL (ref 80.0–100.0)
Monocytes Absolute: 1.2 10*3/uL — ABNORMAL HIGH (ref 0.1–1.0)
Monocytes Relative: 8 %
Neutro Abs: 12.8 10*3/uL — ABNORMAL HIGH (ref 1.7–7.7)
Neutrophils Relative %: 84 %
Platelets: 159 10*3/uL (ref 150–400)
RBC: 4.58 MIL/uL (ref 3.87–5.11)
RDW: 12.8 % (ref 11.5–15.5)
WBC: 15.1 10*3/uL — ABNORMAL HIGH (ref 4.0–10.5)
nRBC: 0 % (ref 0.0–0.2)

## 2023-03-08 LAB — LIPASE, BLOOD: Lipase: 24 U/L (ref 11–51)

## 2023-03-08 LAB — URINALYSIS, W/ REFLEX TO CULTURE (INFECTION SUSPECTED)
Bilirubin Urine: NEGATIVE
Glucose, UA: NEGATIVE mg/dL
Hgb urine dipstick: NEGATIVE
Ketones, ur: 5 mg/dL — AB
Nitrite: NEGATIVE
Protein, ur: >=300 mg/dL — AB
Specific Gravity, Urine: 1.016 (ref 1.005–1.030)
pH: 8 (ref 5.0–8.0)

## 2023-03-08 LAB — PROTIME-INR
INR: 1.2 (ref 0.8–1.2)
Prothrombin Time: 14.9 s (ref 11.4–15.2)

## 2023-03-08 LAB — PROCALCITONIN: Procalcitonin: 1.48 ng/mL

## 2023-03-08 LAB — LACTIC ACID, PLASMA: Lactic Acid, Venous: 1.1 mmol/L (ref 0.5–1.9)

## 2023-03-08 LAB — PHOSPHORUS: Phosphorus: 2.1 mg/dL — ABNORMAL LOW (ref 2.5–4.6)

## 2023-03-08 LAB — BRAIN NATRIURETIC PEPTIDE: B Natriuretic Peptide: 90.2 pg/mL (ref 0.0–100.0)

## 2023-03-08 LAB — MAGNESIUM: Magnesium: 1.8 mg/dL (ref 1.7–2.4)

## 2023-03-08 MED ORDER — SODIUM CHLORIDE 1 G PO TABS: 1.0000 g | ORAL_TABLET | Freq: Two times a day (BID) | ORAL | Status: DC

## 2023-03-08 MED ORDER — POTASSIUM CHLORIDE CRYS ER 20 MEQ PO TBCR
40.0000 meq | EXTENDED_RELEASE_TABLET | Freq: Once | ORAL | Status: AC
  Administered 2023-03-08: 40 meq via ORAL
  Filled 2023-03-08: qty 2

## 2023-03-08 MED ORDER — PANCRELIPASE (LIP-PROT-AMYL) 36000-114000 UNITS PO CPEP
72000.0000 [IU] | ORAL_CAPSULE | Freq: Three times a day (TID) | ORAL | Status: DC
  Administered 2023-03-09 – 2023-03-13 (×13): 72000 [IU] via ORAL
  Filled 2023-03-08 (×16): qty 2

## 2023-03-08 MED ORDER — ACETAMINOPHEN 325 MG PO TABS
650.0000 mg | ORAL_TABLET | Freq: Once | ORAL | Status: AC
  Administered 2023-03-08: 650 mg via ORAL
  Filled 2023-03-08: qty 2

## 2023-03-08 MED ORDER — LEVETIRACETAM 500 MG PO TABS
500.0000 mg | ORAL_TABLET | Freq: Two times a day (BID) | ORAL | Status: DC
  Administered 2023-03-08 – 2023-03-13 (×10): 500 mg via ORAL
  Filled 2023-03-08 (×10): qty 1

## 2023-03-08 MED ORDER — OXYBUTYNIN CHLORIDE ER 5 MG PO TB24
10.0000 mg | ORAL_TABLET | Freq: Two times a day (BID) | ORAL | Status: DC
  Administered 2023-03-08 – 2023-03-13 (×10): 10 mg via ORAL
  Filled 2023-03-08 (×7): qty 2
  Filled 2023-03-08: qty 1
  Filled 2023-03-08: qty 2
  Filled 2023-03-08: qty 1

## 2023-03-08 MED ORDER — PANTOPRAZOLE SODIUM 40 MG PO TBEC
40.0000 mg | DELAYED_RELEASE_TABLET | Freq: Every day | ORAL | Status: DC
  Administered 2023-03-09 – 2023-03-13 (×5): 40 mg via ORAL
  Filled 2023-03-08 (×5): qty 1

## 2023-03-08 MED ORDER — PANCRELIPASE (LIP-PROT-AMYL) 36000-114000 UNITS PO CPEP
36000.0000 [IU] | ORAL_CAPSULE | ORAL | Status: DC
  Administered 2023-03-09 – 2023-03-10 (×5): 36000 [IU] via ORAL
  Filled 2023-03-08 (×7): qty 1

## 2023-03-08 MED ORDER — GLUCOSAMINE-CHONDROITIN 500-400 MG PO CAPS: ORAL_CAPSULE | Freq: Every day | ORAL | Status: DC

## 2023-03-08 MED ORDER — TIZANIDINE HCL 4 MG PO TABS
4.0000 mg | ORAL_TABLET | Freq: Every day | ORAL | Status: DC
Start: 2023-03-08 — End: 2023-03-13
  Administered 2023-03-08 – 2023-03-12 (×5): 4 mg via ORAL
  Filled 2023-03-08: qty 1
  Filled 2023-03-08: qty 2
  Filled 2023-03-08 (×3): qty 1

## 2023-03-08 MED ORDER — IBUPROFEN 400 MG PO TABS
400.0000 mg | ORAL_TABLET | Freq: Four times a day (QID) | ORAL | Status: DC | PRN
  Administered 2023-03-09 – 2023-03-13 (×3): 400 mg via ORAL
  Filled 2023-03-08 (×3): qty 1

## 2023-03-08 MED ORDER — AMANTADINE HCL 100 MG PO CAPS
100.0000 mg | ORAL_CAPSULE | Freq: Three times a day (TID) | ORAL | Status: DC
Start: 2023-03-08 — End: 2023-03-13
  Administered 2023-03-08 – 2023-03-13 (×14): 100 mg via ORAL
  Filled 2023-03-08 (×15): qty 1

## 2023-03-08 MED ORDER — LORAZEPAM 2 MG/ML IJ SOLN: 2.0000 mg | INTRAMUSCULAR | Status: DC | PRN

## 2023-03-08 MED ORDER — IOHEXOL 300 MG/ML  SOLN
100.0000 mL | Freq: Once | INTRAMUSCULAR | Status: AC | PRN
  Administered 2023-03-08: 100 mL via INTRAVENOUS

## 2023-03-08 MED ORDER — LEG VEIN & CIRCULATION PO TABS: 1.0000 | ORAL_TABLET | Freq: Two times a day (BID) | ORAL | Status: DC

## 2023-03-08 MED ORDER — LORATADINE 10 MG PO TABS
10.0000 mg | ORAL_TABLET | Freq: Every day | ORAL | Status: DC
Start: 2023-03-09 — End: 2023-03-13
  Administered 2023-03-09 – 2023-03-13 (×5): 10 mg via ORAL
  Filled 2023-03-08 (×5): qty 1

## 2023-03-08 MED ORDER — ENOXAPARIN SODIUM 60 MG/0.6ML IJ SOSY
0.5000 mg/kg | PREFILLED_SYRINGE | INTRAMUSCULAR | Status: DC
  Administered 2023-03-09 – 2023-03-13 (×5): 50 mg via SUBCUTANEOUS
  Filled 2023-03-08 (×5): qty 0.6

## 2023-03-08 MED ORDER — SODIUM CHLORIDE 0.9 % IV SOLN
1.0000 g | Freq: Three times a day (TID) | INTRAVENOUS | Status: AC
  Administered 2023-03-08 – 2023-03-12 (×13): 1 g via INTRAVENOUS
  Filled 2023-03-08 (×13): qty 20

## 2023-03-08 MED ORDER — SODIUM CHLORIDE 0.9 % IV SOLN: INTRAVENOUS | Status: DC

## 2023-03-08 MED ORDER — TURMERIC 500 MG PO CAPS: 500.0000 mg | ORAL_CAPSULE | Freq: Every day | ORAL | Status: DC

## 2023-03-08 MED ORDER — SODIUM CHLORIDE 0.9 % IV BOLUS
1000.0000 mL | Freq: Once | INTRAVENOUS | Status: AC
  Administered 2023-03-08: 1000 mL via INTRAVENOUS

## 2023-03-08 MED ORDER — ONDANSETRON HCL 4 MG/2ML IJ SOLN: 4.0000 mg | Freq: Three times a day (TID) | INTRAMUSCULAR | Status: DC | PRN

## 2023-03-08 MED ORDER — BACLOFEN 10 MG PO TABS
10.0000 mg | ORAL_TABLET | Freq: Three times a day (TID) | ORAL | Status: DC
  Administered 2023-03-08 – 2023-03-13 (×14): 10 mg via ORAL
  Filled 2023-03-08 (×14): qty 1

## 2023-03-08 MED ORDER — SODIUM CHLORIDE 0.9 % IV SOLN
2.0000 g | Freq: Once | INTRAVENOUS | Status: AC
  Administered 2023-03-08: 2 g via INTRAVENOUS
  Filled 2023-03-08: qty 20

## 2023-03-08 MED ORDER — SODIUM CHLORIDE 1 G PO TABS
1.0000 g | ORAL_TABLET | Freq: Two times a day (BID) | ORAL | Status: AC
  Administered 2023-03-08 – 2023-03-11 (×6): 1 g via ORAL
  Filled 2023-03-08 (×6): qty 1

## 2023-03-08 MED ORDER — HAIR SKIN AND NAILS FORMULA PO TABS: 1.0000 | ORAL_TABLET | Freq: Two times a day (BID) | ORAL | Status: DC

## 2023-03-08 NOTE — ED Triage Notes (Signed)
Pt from home. Pt has home health that only comes once a day. Pt states she has had diarrhea the last 3 days and no appetite, unable to clean herself up at this time, which is why pt stated she came here. Pt has a chronic catheter.

## 2023-03-08 NOTE — ED Provider Notes (Signed)
[facility] Provider Note    Event Date/Time   First MD Initiated Contact with Patient 03/08/23 1508     (approximate)   History   Weakness   HPI  Rebecca [name] is a 63 y.o. female with a history of MS, neurogenic bladder, and seizure disorder who presents with diarrhea for the last week, watery and nonbloody, multiple episodes per day, associated with nausea but no vomiting.  The patient also reports some diffuse abdominal discomfort over the same period.  Over the last several days she has felt very weak and has had no appetite.  She has developed a fever as well.  She has a mild cough but denies shortness of breath.  I reviewed the past medical records; the patient's most recent outpatient encounter was with Family Medicine on 1/13 for diarrhea.     Physical Exam   Triage Vital Signs: ED Triage Vitals  Encounter Vitals Group     BP 03/08/23 1505 (!) 143/87     Systolic BP Percentile --      Diastolic BP Percentile --      Pulse Rate 03/08/23 1505 (!) 132     Resp 03/08/23 1505 16     Temp 03/08/23 1508 (!) 100.5 F (38.1 C)     Temp Source 03/08/23 1508 Oral     SpO2 03/08/23 1505 100 %     Weight --      Height --      Head Circumference --      Peak Flow --      Pain Score 03/08/23 1502 0     Pain Loc --      Pain Education --      Exclude from Growth Chart --     Most recent vital signs: Vitals:   03/08/23 1508 03/08/23 1804  BP:  111/62  Pulse:  (!) 120  Resp:  20  Temp: (!) 100.5 F (38.1 C) 98.6 F (37 C)  SpO2:  96%     General: Awake, somewhat weak appearing but in no distress.  CV:  Good peripheral perfusion.  Resp:  Normal effort.  Lungs CTAB. Abd:  Soft with no focal tenderness.  No distention.  Other:  Dry mucous membranes.  Motor intact in all extremities.  2+ bilateral lower extremity edema.   ED Results / Procedures / Treatments   Labs (all labs ordered are listed, but only abnormal results are  displayed) Labs Reviewed  COMPREHENSIVE METABOLIC PANEL - Abnormal; Notable for the following components:      Result Value   Sodium 128 (*)    Potassium 3.1 (*)    Chloride 96 (*)    CO2 19 (*)    Glucose, Bld 140 (*)    BUN 26 (*)    Calcium 8.7 (*)    Albumin 3.2 (*)    AST 45 (*)    ALT 45 (*)    Total Bilirubin 1.3 (*)    All other components within normal limits  CBC WITH DIFFERENTIAL/PLATELET - Abnormal; Notable for the following components:   WBC 15.1 (*)    Neutro Abs 12.8 (*)    Monocytes Absolute 1.2 (*)    Abs Immature Granulocytes 0.08 (*)    All other components within normal limits  URINALYSIS, W/ REFLEX TO CULTURE (INFECTION SUSPECTED) - Abnormal; Notable for the following components:   Color, Urine YELLOW (*)    APPearance TURBID (*)    Ketones, ur 5 (*)  Protein, ur >=300 (*)    Leukocytes,Ua SMALL (*)    Bacteria, UA FEW (*)    All other components within normal limits  RESP PANEL BY RT-PCR (RSV, FLU A&B, COVID)  RVPGX2  CULTURE, BLOOD (ROUTINE X 2)  CULTURE, BLOOD (ROUTINE X 2)  URINE CULTURE  C DIFFICILE QUICK SCREEN W PCR REFLEX    GASTROINTESTINAL PANEL BY PCR, STOOL (REPLACES STOOL CULTURE)  LACTIC ACID, PLASMA  PROTIME-INR  LIPASE, BLOOD  MAGNESIUM  PHOSPHORUS  BRAIN NATRIURETIC PEPTIDE  PROCALCITONIN  HIV ANTIBODY (ROUTINE TESTING W REFLEX)  COMPREHENSIVE METABOLIC PANEL  CBC     EKG  ED ECG REPORT I, Dionne Bucy, the attending physician, personally viewed and interpreted this ECG.  Date: 03/08/2023 EKG Time: 1514  Rate: 131 Rhythm: Sinus tachycardia QRS Axis: normal Intervals: LPFB ST/T Wave abnormalities: nonspecific T-wave abnormalities Narrative Interpretation: no evidence of acute ischemia    RADIOLOGY  Chest x-ray: I independently viewed and interpreted the images; there is no focal consolidation or edema  IMPRESSION:  1. Segments of cortical hypoenhancement in the RIGHT kidney and LEFT  kidney most  consistent with multifocal focal pyelonephritis. Renal  infarctions are considered but not favored.  2. No evidence of ureteritis.  3. Mucosal enhancement within the collapsed bladder suggests  cystitis. Foley catheter in place.  4. No evidence of bowel inflammation.    PROCEDURES:  Critical Care performed: Yes, see critical care procedure note(s)  .Critical Care  Performed by: Dionne Bucy, MD Authorized by: Dionne Bucy, MD   Critical care provider statement:    Critical care time (minutes):  30   Critical care time was exclusive of:  Separately billable procedures and treating other patients   Critical care was necessary to treat or prevent imminent or life-threatening deterioration of the following conditions:  Sepsis and dehydration   Critical care was time spent personally by me on the following activities:  Development of treatment plan with patient or surrogate, discussions with consultants, evaluation of patient's response to treatment, examination of patient, ordering and review of laboratory studies, ordering and review of radiographic studies, ordering and performing treatments and interventions, pulse oximetry, re-evaluation of patient's condition, review of old charts and obtaining history from patient or surrogate   Care discussed with: admitting provider      MEDICATIONS ORDERED IN ED: Medications  cefTRIAXone (ROCEPHIN) 2 g in sodium chloride 0.9 % 100 mL IVPB (has no administration in time range)  sodium chloride tablet 1 g (has no administration in time range)  0.9 %  sodium chloride infusion (has no administration in time range)  LORazepam (ATIVAN) injection 2 mg (has no administration in time range)  ondansetron (ZOFRAN) injection 4 mg (has no administration in time range)  ibuprofen (ADVIL) tablet 400 mg (has no administration in time range)  enoxaparin (LOVENOX) injection 40 mg (has no administration in time range)  meropenem (MERREM) 1 g in  sodium chloride 0.9 % 100 mL IVPB (has no administration in time range)  acetaminophen (TYLENOL) tablet 650 mg (650 mg Oral Given 03/08/23 1641)  sodium chloride 0.9 % bolus 1,000 mL (0 mLs Intravenous Stopped 03/08/23 2043)  iohexol (OMNIPAQUE) 300 MG/ML solution 100 mL (100 mLs Intravenous Contrast Given 03/08/23 1828)  potassium chloride SA (KLOR-CON M) CR tablet 40 mEq (40 mEq Oral Given 03/08/23 2042)  sodium chloride 0.9 % bolus 1,000 mL (1,000 mLs Intravenous New Bag/Given 03/08/23 2043)     IMPRESSION / MDM / ASSESSMENT AND PLAN / ED COURSE  I reviewed the triage vital signs and the nursing notes.  63 year old female with PMH as noted above presents with diarrhea over the last week associated with nausea, decreased appetite, and increased generalized weakness and fever over the last few days.  On exam she has a low-grade fever and is tachycardic.  Other vital signs are normal.  Abdomen is soft and nontender.  Membranes are dry.  Differential diagnosis includes, but is not limited to, flu Enza, COVID, other viral syndrome, viral gastroenteritis, foodborne illness, less likely colitis or diverticulitis given the lack of significant abdominal tenderness.  We will obtain lab workup, chest, give fluids and antipyretic, and reassess.  Patient's presentation is most consistent with acute presentation with potential threat to life or bodily function.  The patient is on the cardiac monitor to evaluate for evidence of arrhythmia and/or significant heart rate changes.  ----------------------------------------- 8:43 PM on 03/08/2023 -----------------------------------------  Respiratory panel is negative.  Urinalysis shows minimal findings to suggest UTI.  However the CBC shows elevated WBC count.  Lactate is normal.  Given the abdominal pain and diarrhea with an otherwise nondiagnostic workup, I obtained a CT of the abdomen which shows evidence of likely cystitis and pyelonephritis.  Per the  radiologist, renal infarct cannot be ruled out.  Clinically I have a very low suspicion for this given that the patient is having minimal pain at this time.  Her creatinine is normal.  I consulted and discussed the case with Dr. Richardo Hanks from urology who reviewed the images.  He agrees that there is no evidence for renal infarct and no indication for specific urologic intervention.  I ordered IV ceftriaxone.  I consulted Dr. Clyde Lundborg from the hospitalist service; based on our discussion he agrees to evaluate the patient for admission.   FINAL CLINICAL IMPRESSION(S) / ED DIAGNOSES   Final diagnoses:  Pyelonephritis     Rx / DC Orders   ED Discharge Orders     None        Note:  This document was prepared using Dragon voice recognition software and may include unintentional dictation errors.    Dionne Bucy, MD 03/08/23 2045

## 2023-03-08 NOTE — Consult Note (Signed)
Pharmacy Antibiotic Note  Rebecca [name] is a 63 y.o. female admitted on 03/08/2023 with  pyelo .  Pharmacy has been consulted for meropenem dosing. Tmax 100.5, WBC 15.1, UA: no WBC, clean catch, negative nitrite, few bacteria. No ucx obtain prior to abx administration.   Positive Ucx for:  2024:  >=100,000 COLONIES/mL ENTEROBACTER AEROGENES  60,000 COLONIES/mL PROTEUS MIRABILIS  2022: 70,000 COLONIES/mL KLEBSIELLA PNEUMONIAE  60,000 COLONIES/mL ESCHERICHIA COLI  2020: >=100,000 COLONIES/mL MORGANELLA MORGANII Abnormal   80,000 COLONIES/mL VANCOMYCIN RESISTANT ENTEROCOCCUS  2019:  Escherichia coli Abnormal  ESBL.   >=100,000 COLONIES/mL SERRATIA MARCESCENS    Plan: Start Meropenem 1 g q8H.      Temp (24hrs), Avg:99.6 F (37.6 C), Min:98.6 F (37 C), Max:100.5 F (38.1 C)  Recent Labs  Lab 03/08/23 1514  WBC 15.1*  CREATININE 0.69  LATICACIDVEN 1.1    CrCl cannot be calculated (Unknown ideal weight.).    Allergies  Allergen Reactions   Fentanyl Nausea And Vomiting and Nausea Only    vomiting Was given in PACU x3 each time patient got sick    Sulfa Antibiotics Hives and Other (See Comments)    Light headed, over heated    Antimicrobials this admission: 1/29 ceftriaxone x 1 1/29 meropenem >>   Dose adjustments this admission: none  Microbiology results: 1/29 BCx: pending  Thank you for allowing pharmacy to be a part of this patient's care.  Ronnald Ramp 03/08/2023 8:32 PM

## 2023-03-08 NOTE — ED Triage Notes (Signed)
Pt HR is in the 150

## 2023-03-08 NOTE — H&P (Signed)
History and Physical    Rebecca [name] WGN:[id number] DOB: 1960-12-27 DOA: 03/08/2023  Referring MD/NP/PA:   PCP: Danelle Berry, PA-C   Patient coming from:  The patient is coming from home.     Chief Complaint: Weakness, fever, chills, diarrhea  HPI: Rebecca [name] is a 63 y.o. female with medical history significant of MS, spasmatic paraplegia, bed-bound  neurogenic bladder with chronic Foley catheter placement, diastolic CHF, cardiac, chronic pancreatic insufficiency, lymphedema, breast cancer (s/p of right mastectomy), endocarditis, seizure, who presents with weakness, fever, chills, diarrhea.  Patient states that she has diarrhea for almost a week, with more than 10 times of diarrhea each day.  No nausea, vomiting.  She has mild lower abdominal discomfort.  Denies chest pain, cough, SOB.  Patient has fever and chills.  She has been feeling weak recently.  Denies flank pain.  Data reviewed independently and ED Course: pt was found to have positive urinalysis (turbid appearance, small amount of leukocyte, few bacteria, WBC 0-5), WBC 15.1, lactic acid 1.1, procalcitonin 1.48, INR 1.2 GFR> 60, sodium 128, potassium 3.1, magnesium 1.8, phosphorus 2.1, liver function (ALP 84, AST 45, ALT 45, total bilirubin 1.3).  Temperature 100.5, heart rate was reportedly up to 150s,--> 120-130 in ED, RR 20, oxygen saturation 96% on room air.  Chest x-ray negative.  CT scan of abdomen/pelvis findings are suggestive for pyelonephritis.  Patient is admitted to telemetry bed as inpatient.  CT scan of abdomen/pelvis: 1. Segments of cortical hypoenhancement in the RIGHT kidney and LEFT kidney most consistent with multifocal focal pyelonephritis. Renal infarctions are considered but not favored. 2. No evidence of ureteritis. 3. Mucosal enhancement within the collapsed bladder suggests cystitis. Foley catheter in place. 4. No evidence of bowel inflammation.    EKG: I have personally reviewed.  Sinus rhythm, QTc  428, PVC, low voltage, RAD, anteroseptal infarction pattern   Review of Systems:   General: has fevers, chills, no body weight gain, has poor appetite, has fatigue HEENT: no blurry vision, hearing changes or sore throat Respiratory: no dyspnea, coughing, wheezing CV: no chest pain, no palpitations GI: no nausea, vomiting, has lower abdominal pain,  has diarrhea, no constipation GU: no dysuria, burning on urination, increased urinary frequency, hematuria  Ext: has leg edema Neuro:  no vision change or hearing loss.  Has muscle spasm due to MS Skin: no rash, no skin tear. MSK: No muscle spasm, no deformity, no limitation of range of movement in spin Heme: No easy bruising.  Travel history: No recent long distant travel.   Allergy:  Allergies  Allergen Reactions   Fentanyl Nausea And Vomiting and Nausea Only    vomiting Was given in PACU x3 each time patient got sick    Sulfa Antibiotics Hives and Other (See Comments)    Light headed, over heated    Past Medical History:  Diagnosis Date   Allergy    Aortic valve disease    Mild AS / AI - most recent Echo demonstrated tricuspid aortic valve.   Bacterial endocarditis    History of .   Bilateral impacted cerumen 06/29/2021   Bilateral lower extremity edema    Noncardiac.  Chronic. LE Venous dopplers - negative for DVT.; Echocardiogram January 2016: Normal EF with normal wall motion and valve function. Only grade 1 diastolic dysfunction. EF 60-65%. Mild MR   Breast cancer (HCC) 12/31/2013   Right breast, 12:00, 1.5 cm, T1c,N0 invasive mammary carcinoma, triple negative. --> Rx with Chemo   Cervical stenosis  of spine    GERD (gastroesophageal reflux disease) April or May 2022   Heart murmur 2013   Herpes zoster    IBS (irritable bowel syndrome)    Lymphedema    has legs wrapped at [facility]   Multiple sclerosis (HCC) 2001   Walks from room to room @ home; but Wheelchair when going out.   Neuromuscular disorder (HCC)    MS    PONV (postoperative nausea and vomiting)    Related to Fentanyl   Seizures (HCC)    Takes Keppra   SOB (shortness of breath) 03/01/2013   Syncope and collapse    Urinary tract infection associated with indwelling urethral catheter (HCC) 02/17/2020    Past Surgical History:  Procedure Laterality Date   ANKLE SURGERY     Left   ANKLE SURGERY     ANTERIOR CERVICAL DECOMP/DISCECTOMY FUSION  11/17/2011   Procedure: ANTERIOR CERVICAL DECOMPRESSION/DISCECTOMY FUSION 2 LEVELS;  Surgeon: Maeola Harman, MD;  Location: [facility];  Service: Neurosurgery;  Laterality: N/A;  Cervical Five-Six Six-Seven Anterior cervical decompression/diskectomy/fusion   BREAST BIOPSY Right 12/31/2013   invasive mammary   BREAST SURGERY Right 02/03/2014   Right simple mastectomy with sentinel node biopsy.   CHOLECYSTECTOMY     COLONOSCOPY  2014   ESOPHAGOGASTRODUODENOSCOPY (EGD) WITH PROPOFOL N/A 07/16/2020   Procedure: ESOPHAGOGASTRODUODENOSCOPY (EGD) WITH PROPOFOL;  Surgeon: Pasty Spillers, MD;  Location: [facility] ENDOSCOPY;  Service: Endoscopy;  Laterality: N/A;  Patient has MS and will need assistance   HYSTEROSCOPY WITH D & C N/A 11/27/2018   Procedure: DILATATION AND CURETTAGE /HYSTEROSCOPY;  Surgeon: Nadara Mustard, MD;  Location: [facility] ORS;  Service: Gynecology;  Laterality: N/A;   Lower extremity venous Dopplers  02/27/2013   No LE DVT   MASTECTOMY Right 2015   ORIF TIBIA PLATEAU Left 12/09/2020   Procedure: LEFT OPEN REDUCTION INTERNAL FIXATION (ORIF) TIBIAL PLATEAU;  Surgeon: Roby Lofts, MD;  Location: [facility] OR;  Service: Orthopedics;  Laterality: Left;   Port a cath insertion Right 01/19/2010   PORT-A-CATH REMOVAL     right   PORT-A-CATH REMOVAL Right 09/03/2013   Procedure: REMOVAL PORT-A-CATH;  Surgeon: Fransisco Hertz, MD;  Location: [facility] OR;  Service: Vascular;  Laterality: Right;   SPINE SURGERY  Cervical steinois Dr. Venetia Maxon 2014 I think?   TONSILLECTOMY     TRANSTHORACIC ECHOCARDIOGRAM   03/2013; 02/2014   a) Normal LV size and function with EF 60-65%.; Cannot exclude bicuspid aortic valve with mild AS and mild AI.; b) Normal EF with normal wall motion and valve function x Mild MR. G2 DD. EF 60-65%. Tricuspid AoV   UPPER GI ENDOSCOPY  2014    Social History:  reports that she has never smoked. She has never used smokeless tobacco. She reports that she does not drink alcohol and does not use drugs.  Family History:  Family History  Problem Relation Age of Onset   Cancer Father        skin   Heart disease Father    Heart attack Father        heart attack in his 5's   Thyroid disease Sister    Ovarian cancer Cousin    Breast cancer Maternal Aunt 21   Breast cancer Maternal Grandmother 69   Bladder Cancer Neg Hx    Kidney cancer Neg Hx      Prior to Admission medications   Medication Sig Start Date End Date Taking? Authorizing Provider  amantadine (SYMMETREL) 100 MG  capsule TAKE 1 CAPSULE BY MOUTH THREE TIMES A DAY 10/31/22   Penumalli, Glenford Bayley, MD  baclofen (LIORESAL) 10 MG tablet Take 1-2 tablets (10-20 mg total) by mouth 3 (three) times daily. 08/15/22   Penumalli, Glenford Bayley, MD  gluconic acid-citric acid (RENACIDIN) irrigation Insert 30 mL into suprapubic tube and clamp tube for 10 minutes and then drain twice daily 09/09/22   McGowan, Carollee Herter A, PA-C  Glucosamine-Chondroitin (COSAMIN DS PO) Take 1 tablet by mouth 5 (five) times daily.    [provider]  levETIRAcetam (KEPPRA) 500 MG tablet Take 1 tablet (500 mg total) by mouth 2 (two) times daily. 08/15/22   Penumalli, Glenford Bayley, MD  lidocaine-prilocaine (EMLA) cream Apply 1 application topically as needed. 09/20/19   Jeralyn Ruths, MD  lipase/protease/amylase (CREON) 36000 UNITS CPEP capsule Take 2 capsules (72,000 Units total) by mouth 3 (three) times daily with meals AND 1 capsule (36,000 Units total) with snacks. 08/25/22 08/20/23  Celso Amy, PA-C  loratadine (CLARITIN) 10 MG tablet Take 10 mg by mouth  daily.    [provider]  Misc Natural Products (LEG VEIN & CIRCULATION) TABS Take 1 tablet by mouth 2 (two) times daily.     [provider]  Multiple Vitamins-Minerals (HAIR SKIN AND NAILS FORMULA) TABS Take 1 tablet by mouth 2 (two) times daily.     [provider]  Multiple Vitamins-Minerals (MULTIVITAMIN PO) Take 1 tablet by mouth daily.     [provider]  nystatin (MYCOSTATIN/NYSTOP) powder Apply 1 application topically 3 (three) times daily as needed. For rash/raw skin as needed 07/31/19   Danelle Berry, PA-C  omeprazole (PRILOSEC) 40 MG capsule TAKE 1 CAPSULE (40 MG TOTAL) BY MOUTH DAILY. 01/16/23   Danelle Berry, PA-C  oxybutynin (DITROPAN-XL) 10 MG 24 hr tablet Take 1 tablet (10 mg total) by mouth 2 (two) times daily. 01/16/23   McGowan, Carollee Herter A, PA-C  REBIF REBIDOSE 44 MCG/0.5ML SOAJ INJECT 1 PEN UNDER THE SKIN 3 TIMES PER WEEK 10/06/22   Penumalli, Glenford Bayley, MD  tiZANidine (ZANAFLEX) 4 MG tablet Take 1 tablet (4 mg total) by mouth at bedtime. 08/15/22   Penumalli, Glenford Bayley, MD  Turmeric 500 MG CAPS Take 500 mg by mouth daily.    [provider]    Physical Exam: Vitals:   03/08/23 1804 03/08/23 2200 03/08/23 2230 03/08/23 2323  BP: 111/62 122/73 117/63   Pulse: (!) 120 (!) 110 (!) 107   Resp: 20 19 17    Temp: 98.6 F (37 C)   98.8 F (37.1 C)  TempSrc: Oral   Oral  SpO2: 96% 100% 100%    General: Not in acute distress HEENT:       Eyes: PERRL, EOMI, no jaundice       ENT: No discharge from the ears and nose, no pharynx injection, no tonsillar enlargement.        Neck: No JVD, no bruit, no mass felt. Heme: No neck lymph node enlargement. Cardiac: S1/S2, RRR, No murmurs, No gallops or rubs. Respiratory: No rales, wheezing, rhonchi or rubs. GI: Soft, nondistended, has mild tenderness in suprapubic area, no rebound pain, no organomegaly, BS present. GU: No hematuria Ext: Has chronic lymphedema in both legs, with 3+ pitting leg edema  bilaterally.  1+DP/PT pulse bilaterally. Musculoskeletal: No joint deformities, No joint redness or warmth, no limitation of ROM in spin. Skin: No rashes.  Neuro: Alert, oriented X3, cranial nerves II-XII grossly intact.  Has spasmatic paraplegia, Psych: Patient  is not psychotic, no suicidal or hemocidal ideation.  Labs on Admission: I have personally reviewed following labs and imaging studies  CBC: Recent Labs  Lab 03/08/23 1514  WBC 15.1*  NEUTROABS 12.8*  HGB 13.6  HCT 39.2  MCV 85.6  PLT 159   Basic Metabolic Panel: Recent Labs  Lab 03/08/23 1514  NA 128*  K 3.1*  CL 96*  CO2 19*  GLUCOSE 140*  BUN 26*  CREATININE 0.69  CALCIUM 8.7*  MG 1.8  PHOS 2.1*   GFR: CrCl cannot be calculated (Unknown ideal weight.). Liver Function Tests: Recent Labs  Lab 03/08/23 1514  AST 45*  ALT 45*  ALKPHOS 84  BILITOT 1.3*  PROT 7.6  ALBUMIN 3.2*   Recent Labs  Lab 03/08/23 1514  LIPASE 24   No results for input(s): "AMMONIA" in the last 168 hours. Coagulation Profile: Recent Labs  Lab 03/08/23 1514  INR 1.2   Cardiac Enzymes: No results for input(s): "CKTOTAL", "CKMB", "CKMBINDEX", "TROPONINI" in the last 168 hours. BNP (last 3 results) No results for input(s): "PROBNP" in the last 8760 hours. HbA1C: No results for input(s): "HGBA1C" in the last 72 hours. CBG: No results for input(s): "GLUCAP" in the last 168 hours. Lipid Profile: No results for input(s): "CHOL", "HDL", "LDLCALC", "TRIG", "CHOLHDL", "LDLDIRECT" in the last 72 hours. Thyroid Function Tests: No results for input(s): "TSH", "T4TOTAL", "FREET4", "T3FREE", "THYROIDAB" in the last 72 hours. Anemia Panel: No results for input(s): "VITAMINB12", "FOLATE", "FERRITIN", "TIBC", "IRON", "RETICCTPCT" in the last 72 hours. Urine analysis:    Component Value Date/Time   COLORURINE YELLOW (A) 03/08/2023 1514   APPEARANCEUR TURBID (A) 03/08/2023 1514   APPEARANCEUR Clear 04/03/2017 1409   LABSPEC 1.016  03/08/2023 1514   LABSPEC 1.014 02/28/2011 1534   PHURINE 8.0 03/08/2023 1514   GLUCOSEU NEGATIVE 03/08/2023 1514   GLUCOSEU Negative 02/28/2011 1534   HGBUR NEGATIVE 03/08/2023 1514   BILIRUBINUR NEGATIVE 03/08/2023 1514   BILIRUBINUR negative 07/16/2018 1453   BILIRUBINUR Negative 04/03/2017 1409   BILIRUBINUR Negative 02/28/2011 1534   KETONESUR 5 (A) 03/08/2023 1514   PROTEINUR >=300 (A) 03/08/2023 1514   UROBILINOGEN negative (A) 07/16/2018 1453   NITRITE NEGATIVE 03/08/2023 1514   LEUKOCYTESUR SMALL (A) 03/08/2023 1514   LEUKOCYTESUR Trace 02/28/2011 1534   Sepsis Labs: @LABRCNTIP (procalcitonin:4,lacticidven:4) ) Recent Results (from the past 240 hours)  Resp panel by RT-PCR (RSV, Flu A&B, Covid) Anterior Nasal Swab     Status: None   Collection Time: 03/08/23  4:42 PM   Specimen: Anterior Nasal Swab  Result Value Ref Range Status   SARS Coronavirus 2 by RT PCR NEGATIVE NEGATIVE Final    Comment: (NOTE) SARS-CoV-2 target nucleic acids are NOT DETECTED.  The SARS-CoV-2 RNA is generally detectable in upper respiratory specimens during the acute phase of infection. The lowest concentration of SARS-CoV-2 viral copies this assay can detect is 138 copies/mL. A negative result does not preclude SARS-Cov-2 infection and should not be used as the sole basis for treatment or other patient management decisions. A negative result may occur with  improper specimen collection/handling, submission of specimen other than nasopharyngeal swab, presence of viral mutation(s) within the areas targeted by this assay, and inadequate number of viral copies(<138 copies/mL). A negative result must be combined with clinical observations, patient history, and epidemiological information. The expected result is Negative.  Fact Sheet for Patients:  BloggerCourse.com  Fact Sheet for Healthcare Providers:  SeriousBroker.it  This test is no t yet  approved or  cleared by the [place] and  has been authorized for detection and/or diagnosis of SARS-CoV-2 by FDA under an Emergency Use Authorization (EUA). This EUA will remain  in effect (meaning this test can be used) for the duration of the COVID-19 declaration under Section 564(b)(1) of the Act, 21 U.S.C.section 360bbb-3(b)(1), unless the authorization is terminated  or revoked sooner.       Influenza A by PCR NEGATIVE NEGATIVE Final   Influenza B by PCR NEGATIVE NEGATIVE Final    Comment: (NOTE) The Xpert Xpress SARS-CoV-2/FLU/RSV plus assay is intended as an aid in the diagnosis of influenza from Nasopharyngeal swab specimens and should not be used as a sole basis for treatment. Nasal washings and aspirates are unacceptable for Xpert Xpress SARS-CoV-2/FLU/RSV testing.  Fact Sheet for Patients: BloggerCourse.com  Fact Sheet for Healthcare Providers: SeriousBroker.it  This test is not yet approved or cleared by the [place] FDA and has been authorized for detection and/or diagnosis of SARS-CoV-2 by FDA under an Emergency Use Authorization (EUA). This EUA will remain in effect (meaning this test can be used) for the duration of the COVID-19 declaration under Section 564(b)(1) of the Act, 21 U.S.C. section 360bbb-3(b)(1), unless the authorization is terminated or revoked.     Resp Syncytial Virus by PCR NEGATIVE NEGATIVE Final    Comment: (NOTE) Fact Sheet for Patients: BloggerCourse.com  Fact Sheet for Healthcare Providers: SeriousBroker.it  This test is not yet approved or cleared by the [place] FDA and has been authorized for detection and/or diagnosis of SARS-CoV-2 by FDA under an Emergency Use Authorization (EUA). This EUA will remain in effect (meaning this test can be used) for the duration of the COVID-19 declaration under Section 564(b)(1) of  the Act, 21 U.S.C. section 360bbb-3(b)(1), unless the authorization is terminated or revoked.  Performed at [facility], [address] [place], [place] [address]      Radiological Exams on Admission:   Assessment/Plan Principal Problem:   Acute pyelonephritis Active Problems:   Sepsis (HCC)   Seizure disorder (HCC)   MS (multiple sclerosis) (HCC)   Neurogenic bladder   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HCC)   Pancreatic insufficiency   Chronic diastolic CHF (congestive heart failure) (HCC)   Hyponatremia   Hypokalemia   Abnormal LFTs   Diarrhea   Assessment and Plan:  Sepsis due to acute pyelonephritis: pt meets criteria for sepsis with WBC 15.1, temperature 100.5, tachycardia.  Lactic acid is normal.  Procalcitonin 1.48.  Currently hemodynamically stable.  -Admitted to telemetry bed as inpatient -IV meropenem due to history of ESBL (patient received 1 dose of Rocephin and vancomycin in ED) -Follow-up of blood culture and urine culture -IV fluid: 2 L normal saline  Seizure disorder with complex partial seizure disorder (HCC) -Seizure precaution -Continue home Keppra 500 mg twice daily -As needed Ativan for seizure  MS (multiple sclerosis) with spasmatic paraplegia: -Fall precaution -Continue home amantadine, baclofen, tizanidine  Neurogenic bladder -Has Foley catheter -Oxybutynin  Pancreatic insufficiency -Continue Creaon  Chronic diastolic CHF (congestive heart failure) (HCC): 2D echo on 03/14/2016 showed EF of 65% with grade 1 diastolic dysfunction.  Patient has chronic lymphedema in both legs, but denies shortness of breath, no pulm edema on chest x-ray, does not seem to have CHF exacerbation.  Procalcitonin 90.2. -May need to start diuretics after sepsis resolved  Hyponatremia: Sodium 128, mental status normal. -IV normal saline as above -Sodium chloride tablet 1 g twice daily -Fluid restriction  Hypokalemia and hypophosphatemia: Potassium  3.1 and phosphorus 2.1 -Repeat dated both  Abnormal LFTs: Mild.  Possibly due to sepsis. -Avoid using Tylenol  Diarrhea: Partially due to pancreatic insufficiency, but need to rule out C. Difficile -Patient is on Creaon -Follow-up C. difficile and GI pathogen panel      DVT ppx: SQ Lovenox  Code Status: Full code    Family Communication: Yes, patient's aunt at bed side.  Disposition Plan:  Anticipate discharge back to previous environment  Consults called: None  Admission status and Level of care: Telemetry Medical:    for obs as inpt        Dispo: The patient is from: Home              Anticipated d/c is to: Home              Anticipated d/c date is: 2 days              Patient currently is not medically stable to d/c.    Severity of Illness:  The appropriate patient status for this patient is INPATIENT. Inpatient status is judged to be reasonable and necessary in order to provide the required intensity of service to ensure the patient's safety. The patient's presenting symptoms, physical exam findings, and initial radiographic and laboratory data in the context of their chronic comorbidities is felt to place them at high risk for further clinical deterioration. Furthermore, it is not anticipated that the patient will be medically stable for discharge from the hospital within 2 midnights of admission.   * I certify that at the point of admission it is my clinical judgment that the patient will require inpatient hospital care spanning beyond 2 midnights from the point of admission due to high intensity of service, high risk for further deterioration and high frequency of surveillance required.*       Date of Service 03/09/2023    Lorretta Harp [facility]   If 7PM-7AM, please contact night-coverage [email or web address] 03/09/2023, 12:59 AM

## 2023-03-09 ENCOUNTER — Encounter: Payer: Self-pay | Admitting: Internal Medicine

## 2023-03-09 ENCOUNTER — Other Ambulatory Visit: Payer: Self-pay

## 2023-03-09 DIAGNOSIS — E876 Hypokalemia: Secondary | ICD-10-CM

## 2023-03-09 DIAGNOSIS — E871 Hypo-osmolality and hyponatremia: Secondary | ICD-10-CM | POA: Diagnosis not present

## 2023-03-09 DIAGNOSIS — N10 Acute tubulo-interstitial nephritis: Secondary | ICD-10-CM | POA: Diagnosis not present

## 2023-03-09 DIAGNOSIS — A419 Sepsis, unspecified organism: Secondary | ICD-10-CM | POA: Diagnosis not present

## 2023-03-09 LAB — COMPREHENSIVE METABOLIC PANEL
ALT: 61 U/L — ABNORMAL HIGH (ref 0–44)
AST: 76 U/L — ABNORMAL HIGH (ref 15–41)
Albumin: 2.9 g/dL — ABNORMAL LOW (ref 3.5–5.0)
Alkaline Phosphatase: 78 U/L (ref 38–126)
Anion gap: 10 (ref 5–15)
BUN: 22 mg/dL (ref 8–23)
CO2: 19 mmol/L — ABNORMAL LOW (ref 22–32)
Calcium: 8.6 mg/dL — ABNORMAL LOW (ref 8.9–10.3)
Chloride: 103 mmol/L (ref 98–111)
Creatinine, Ser: 0.59 mg/dL (ref 0.44–1.00)
GFR, Estimated: >60 mL/min (ref >60)
Glucose, Bld: 110 mg/dL — ABNORMAL HIGH (ref 70–99)
Potassium: 3.8 mmol/L (ref 3.5–5.1)
Sodium: 132 mmol/L — ABNORMAL LOW (ref 135–145)
Total Bilirubin: 0.8 mg/dL (ref 0.0–1.2)
Total Protein: 7.5 g/dL (ref 6.5–8.1)

## 2023-03-09 LAB — GASTROINTESTINAL PANEL BY PCR, STOOL (REPLACES STOOL CULTURE)

## 2023-03-09 LAB — C DIFFICILE QUICK SCREEN W PCR REFLEX
C Diff antigen: NEGATIVE
C Diff interpretation: NOT DETECTED
C Diff toxin: NEGATIVE

## 2023-03-09 LAB — CBC
HCT: 39.9 % (ref 36.0–46.0)
Hemoglobin: 13.2 g/dL (ref 12.0–15.0)
MCH: 29.7 pg (ref 26.0–34.0)
MCHC: 33.1 g/dL (ref 30.0–36.0)
MCV: 89.9 fL (ref 80.0–100.0)
Platelets: 133 10*3/uL — ABNORMAL LOW (ref 150–400)
RBC: 4.44 MIL/uL (ref 3.87–5.11)
RDW: 13 % (ref 11.5–15.5)
WBC: 14.3 10*3/uL — ABNORMAL HIGH (ref 4.0–10.5)
nRBC: 0 % (ref 0.0–0.2)

## 2023-03-09 LAB — HIV ANTIBODY (ROUTINE TESTING W REFLEX): HIV Screen 4th Generation wRfx: NONREACTIVE

## 2023-03-09 LAB — PHOSPHORUS: Phosphorus: 1.8 mg/dL — ABNORMAL LOW (ref 2.5–4.6)

## 2023-03-09 MED ORDER — ADULT MULTIVITAMIN W/MINERALS CH
1.0000 | ORAL_TABLET | Freq: Every day | ORAL | Status: DC
  Administered 2023-03-09 – 2023-03-13 (×5): 1 via ORAL
  Filled 2023-03-09 (×5): qty 1

## 2023-03-09 MED ORDER — POTASSIUM PHOSPHATES 15 MMOLE/5ML IV SOLN
15.0000 mmol | Freq: Once | INTRAVENOUS | Status: AC
  Administered 2023-03-09: 15 mmol via INTRAVENOUS
  Filled 2023-03-09: qty 5

## 2023-03-09 MED ORDER — LACTATED RINGERS IV SOLN: INTRAVENOUS | Status: AC

## 2023-03-09 NOTE — Progress Notes (Signed)
Anticoagulation monitoring(Lovenox):  63 yo  female ordered Lovenox 40 mg Q24h    Filed Weights   03/09/23 0243  Weight: 100.9 kg (222 lb 7.1 oz)   BMI 38.2   Lab Results  Component Value Date   CREATININE 0.69 03/08/2023   CREATININE 0.59 12/07/2022   CREATININE 0.52 08/13/2022   Estimated Creatinine Clearance: 84.3 mL/min (by C-G formula based on SCr of 0.69 mg/dL). Hemoglobin & Hematocrit     Component Value Date/Time   HGB 13.6 03/08/2023 1514   HGB 13.3 10/13/2022 1007   HGB 12.4 06/27/2016 1515   HCT 39.2 03/08/2023 1514   HCT 37.9 06/27/2016 1515     Per Protocol for Patient with estCrcl > 30 ml/min and BMI > 30, will transition to Lovenox 50 mg Q24h.

## 2023-03-09 NOTE — ED Notes (Signed)
Breakfast tray provided.

## 2023-03-09 NOTE — ED Notes (Signed)
Lunch tray provided.

## 2023-03-09 NOTE — TOC Initial Note (Signed)
Transition of Care [facility]) - Initial/Assessment Note    Patient Details  Name: Rebecca [name] MRN: [id number] Date of Birth: [birth year]  Transition of Care [facility]) CM/SW Contact:    Marlowe Sax, RN Phone Number: 03/09/2023, 4:19 PM  Clinical Narrative:                  the patient has HH, they called and said that the patient has had multiple issues with Foley and was scheduled to get a suprapubic cath but cancelled due to not being able to get in a WC and go there, they are asking if we can do a consult for urology since she has had so many issues with chronic foley , I notified the Attending Physician            Patient Goals and CMS Choice            Expected Discharge Plan and Services                                              Prior Living Arrangements/Services                       Activities of Daily Living   ADL Screening (condition at time of admission) Independently performs ADLs?: No Does the patient have a NEW difficulty with bathing/dressing/toileting/self-feeding that is expected to last >3 days?: No Does the patient have a NEW difficulty with getting in/out of bed, walking, or climbing stairs that is expected to last >3 days?: No Does the patient have a NEW difficulty with communication that is expected to last >3 days?: No Is the patient deaf or have difficulty hearing?: No Does the patient have difficulty seeing, even when wearing glasses/contacts?: No Does the patient have difficulty concentrating, remembering, or making decisions?: No  Permission Sought/Granted                  Emotional Assessment              Admission diagnosis:  Pyelonephritis [N12] Acute pyelonephritis [N10] Patient Active Problem List   Diagnosis Date Noted   Acute pyelonephritis 03/08/2023   Sepsis (HCC) 03/08/2023   Chronic diastolic CHF (congestive heart failure) (HCC) 03/08/2023   Hyponatremia 03/08/2023   Hypokalemia 03/08/2023    Abnormal LFTs 03/08/2023   Pancreatic insufficiency 12/07/2022   Conductive hearing loss, bilateral 06/29/2021   Tibia/fibula fracture, left, closed, initial encounter 12/08/2020   Dysphagia    Acute esophagitis    Gastric erythema    Mucosal abnormality of duodenum    Schatzki's ring    Diarrhea 04/14/2020   Elevated BP without diagnosis of hypertension 04/14/2020   Neuromuscular dysfunction of bladder, unspecified 03/27/2020   Acquired absence of unspecified breast and nipple 02/08/2020   History of falling 02/08/2020   Personal history of urinary (tract) infections 02/08/2020   Muscle spasm 02/28/2019   Post-menopausal bleeding 11/20/2018   Ambulatory dysfunction 02/02/2018   Abnormality of gait 11/17/2017   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   Bilateral lower extremity pain (primary) (bilateral) (right greater than left) 08/01/2016   Chronic neck pain (secondary) (bilateral) ( left greater than right) 07/28/2016   Neutropenia (HCC) 06/27/2016   Chronic pain syndrome 06/03/2016   Neurogenic bladder    Neurogenic bowel    Detrusor and sphincter dyssynergia  Urinary incontinence    Seizure disorder (HCC)    Slow transit constipation    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HCC)    Lymphedema    History of breast cancer in female 10/28/2015   Anemia 10/28/2015   Thrombocytopenia (HCC) 10/28/2015   Allergic rhinitis 10/28/2015   IBS (irritable bowel syndrome) 10/28/2015   Uterine leiomyoma 10/24/2014   History of right mastectomy 10/24/2014   MS (multiple sclerosis) (HCC) 09/16/2014   Primary cancer of right female breast (HCC) 01/10/2014   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HCC) 06/08/2011   PCP:  Danelle Berry, PA-C Pharmacy:   [facility] [phone] - [place], [place] - [address] [place] [address] Phone: [phone] Fax: [phone]  [facility] - [place], [place] - [address]  [place] [address] Phone: [phone] Fax: [phone]     Social Drivers of Health (SDOH) Social History: SDOH Screenings   Food Insecurity: No Food Insecurity (03/09/2023)  Housing: Low Risk  (03/09/2023)  Transportation Needs: Unmet Transportation Needs (03/09/2023)  Utilities: Not At Risk (03/09/2023)  Alcohol Screen: Low Risk  (08/17/2021)  Depression (PHQ2-9): Low Risk  (02/20/2023)  Financial Resource Strain: Low Risk  (12/03/2022)  Physical Activity: Inactive (12/03/2022)  Social Connections: Socially Isolated (03/09/2023)  Stress: No Stress Concern Present (12/03/2022)  Tobacco Use: Low Risk  (03/09/2023)  Health Literacy: Adequate Health Literacy (09/29/2022)   SDOH Interventions:     Readmission Risk Interventions     No data to display

## 2023-03-09 NOTE — ED Notes (Signed)
HCP notified about Pt temp being 100.6 orally and HR: 126. LR ordered.

## 2023-03-09 NOTE — Progress Notes (Signed)
Progress Note   Patient: Rebecca [name] YQI:[id number] DOB: 06/08/1960 DOA: 03/08/2023     1 DOS: the patient was seen and examined on 03/09/2023   Brief hospital course: CASHAE WEICH is a 63 y.o. female with medical history significant of MS, spasmatic paraplegia, bed-bound  neurogenic bladder with chronic Foley catheter placement, diastolic CHF, cardiac, chronic pancreatic insufficiency, lymphedema, breast cancer (s/p of right mastectomy), endocarditis, seizure, who presents with weakness, fever, chills, diarrhea.   CT abdomen revealed segments of cortical hypoenhancement in the right kidney and left kidney most consistent with multifocal focal pyelonephritis.  Patient is admitted to hospitalist service for sepsis secondary to acute pyelonephritis, started on IV hydration, IV meropenem therapy due to history of ESBL.  Assessment and Plan: Sepsis due to acute pyelonephritis:  She presented with low-grade fever, tachycardia, WBC 15.1, Lactic acid is normal.  Procalcitonin 1.48.   She will need close telemetry monitoring. Continue IV meropenem due to history of ESBL (patient received 1 dose of Rocephin and vancomycin in ED) Follow-up of blood culture and urine culture Continue gentle IV hydration per sepsis protocol. I will check respiratory 20 pathogen panel.   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 Continue seizure precautions   MS (multiple sclerosis) with spasmatic paraplegia: Continue home dose amantadine, baclofen, tizanidine.   Neurogenic bladder She has chronic Foley catheter which was exchanged in ED Oxybutynin   Pancreatic insufficiency Continue Creon. She does have diarrhea check C. difficile.   Chronic diastolic CHF (congestive heart failure) (HCC): 2D echo on 03/14/2016 showed EF of 65% with grade 1 diastolic dysfunction.  Patient has chronic lymphedema in both legs, but denies  shortness of breath, no pulm edema on chest x-ray.  She is euvolemic, consider diuretics after sepsis resolved   Hyponatremia: Sodium 128, mental status normal. Gentle IV hydration Sodium chloride tablet 1 g twice daily   Hypokalemia and hypophosphatemia:  Continue to replete, recheck as needed.   Abnormal LFTs: Mild.  Possibly due to sepsis. Avoid hepatotoxic drugs.     Out of bed to chair. Incentive spirometry. Nursing supportive care. Fall, aspiration precautions. DVT prophylaxis   Code Status: Full Code  Subjective: Patient is seen and examined today morning. She is accompanied by her aunt. Denies any abdominal pain. Has nausea. Eating fair. Has chronic foley  Physical Exam: Vitals:   03/09/23 1130 03/09/23 1230 03/09/23 1300 03/09/23 1434  BP: 110/62 109/63 113/61 131/72  Pulse:    (!) 106  Resp: (!) 24 (!) 22 20 (!) 22  Temp:    98.2 F (36.8 C)  TempSrc:    Oral  SpO2:    98%  Weight:      Height:        General - Middle aged obese ill Caucasian female, mild respiratory distress HEENT - PERRLA, EOMI, atraumatic head, non tender sinuses. Lung - Clear, no rales, diffuse rhonchi, wheezes. Heart - S1, S2 heard, no murmurs, rubs, 1+ pedal edema. Abdomen - Soft, non tender obese, bowel sounds good Neuro - Alert, awake and oriented x 3, non focal exam. Skin - Warm and dry.  Data Reviewed:      Latest Ref Rng & Units 03/09/2023    5:57 AM 03/08/2023    3:14 PM 12/07/2022   11:17 AM  CBC  WBC 4.0 - 10.5 K/uL 14.3  15.1  5.2   Hemoglobin 12.0 - 15.0 g/dL 38.7  13.6  13.9   Hematocrit 36.0 - 46.0 % 39.9  39.2  42.4   Platelets 150 - 400 K/uL 133  159  171       Latest Ref Rng & Units 03/09/2023    5:57 AM 03/08/2023    3:14 PM 12/07/2022   11:17 AM  BMP  Glucose 70 - 99 mg/dL 147  829  99   BUN 8 - 23 mg/dL 22  26  10    Creatinine 0.44 - 1.00 mg/dL 5.62  1.30  8.65   BUN/Creat Ratio 6 - 22 (calc)   SEE NOTE:   Sodium 135 - 145 mmol/L 132  128  139    Potassium 3.5 - 5.1 mmol/L 3.8  3.1  4.2   Chloride 98 - 111 mmol/L 103  96  102   CO2 22 - 32 mmol/L 19  19  30    Calcium 8.9 - 10.3 mg/dL 8.6  8.7  9.8    CT ABDOMEN PELVIS W CONTRAST Result Date: 03/08/2023 CLINICAL DATA:  Abdominal pain.  Diarrhea. EXAM: CT ABDOMEN AND PELVIS WITH CONTRAST TECHNIQUE: Multidetector CT imaging of the abdomen and pelvis was performed using the standard protocol following bolus administration of intravenous contrast. RADIATION DOSE REDUCTION: This exam was performed according to the departmental dose-optimization program which includes automated exposure control, adjustment of the mA and/or kV according to patient size and/or use of iterative reconstruction technique. CONTRAST:  OMNIPAQUE IOHEXOL 300 MG/ML  SOLN COMPARISON:  None Available. FINDINGS: Lower chest: Lung bases are clear. Hepatobiliary: No focal hepatic lesion. Postcholecystectomy. No biliary dilatation. Pancreas: Pancreas is normal. No ductal dilatation. No pancreatic inflammation. Spleen: Normal spleen Adrenals/urinary tract: Adrenal glands are normal. Kidneys enhance symmetrically. There is a hypoenhancement in the cortex of the mid LEFT kidney RIGHT kidney over a 2.5 cm segment (image 36/2) similar cortical hypoenhancement to lesser degree in the upper pole of the LEFT kidney (image 23/2) ureters are normal. No ureteral enhancement. Foley catheter within collapsed bladder. There is mucosal enhancement within the collapsed bladder. On delayed imaging there is no filling defects within collecting systems or proximal ureters. The cortical hypoenhancement is evident on delayed imaging with 2 foci in the RIGHT kidney. Region of hypoenhancement in the LEFT kidney is more prominent on delayed phase. Stomach/Bowel: Large hiatal hernia. Stomach, duodenum small-bowel normal. Post appendectomy. Small volume of free fluid along the RIGHT pericolic gutter. No evidence of colonic inflammation. Moderate volume stool.  Vascular/Lymphatic: Abdominal aorta is normal caliber with atherosclerotic calcification. There is no retroperitoneal or periportal lymphadenopathy. No pelvic lymphadenopathy. Reproductive: Uterus and adnexa unremarkable. Other: No free fluid. Musculoskeletal: No aggressive osseous lesion. IMPRESSION: 1. Segments of cortical hypoenhancement in the RIGHT kidney and LEFT kidney most consistent with multifocal focal pyelonephritis. Renal infarctions are considered but not favored. 2. No evidence of ureteritis. 3. Mucosal enhancement within the collapsed bladder suggests cystitis. Foley catheter in place. 4. No evidence of bowel inflammation. Electronically Signed   By: Genevive Bi M.D.   On: 03/08/2023 19:47   DG Chest Port 1 View Result Date: 03/08/2023 CLINICAL DATA:  Fever. EXAM: PORTABLE CHEST 1 VIEW COMPARISON:  12/14/2020 FINDINGS: Stable positioning of left chest port. The heart is normal in size, mediastinal contours are stable. Mild retrocardiac atelectasis or scarring. No confluent airspace disease. No pleural effusion or pneumothorax. IMPRESSION: Retrocardiac atelectasis or scarring. No confluent airspace disease. Electronically Signed   By: Narda Rutherford M.D.   On: 03/08/2023 16:39   Family Communication: Discussed with  patient, her aunt at bedside, they understand and agree. All questions answereed.  Disposition: Status is: Inpatient Remains inpatient appropriate because: IV antibiotics,   Planned Discharge Destination: Home with Home Health     Time spent: 42 minutes  Author: Marcelino Duster, MD 03/09/2023 2:47 PM Secure chat 7am to 7pm For on call review [email or web address].

## 2023-03-10 ENCOUNTER — Telehealth: Payer: Self-pay | Admitting: Family Medicine

## 2023-03-10 ENCOUNTER — Other Ambulatory Visit: Payer: Self-pay | Admitting: Physician Assistant

## 2023-03-10 DIAGNOSIS — N319 Neuromuscular dysfunction of bladder, unspecified: Secondary | ICD-10-CM

## 2023-03-10 DIAGNOSIS — E876 Hypokalemia: Secondary | ICD-10-CM | POA: Diagnosis not present

## 2023-03-10 DIAGNOSIS — N10 Acute tubulo-interstitial nephritis: Secondary | ICD-10-CM | POA: Diagnosis not present

## 2023-03-10 DIAGNOSIS — E871 Hypo-osmolality and hyponatremia: Secondary | ICD-10-CM | POA: Diagnosis not present

## 2023-03-10 DIAGNOSIS — A419 Sepsis, unspecified organism: Secondary | ICD-10-CM | POA: Diagnosis not present

## 2023-03-10 DIAGNOSIS — A4151 Sepsis due to Escherichia coli [E. coli]: Secondary | ICD-10-CM | POA: Diagnosis not present

## 2023-03-10 DIAGNOSIS — G35 Multiple sclerosis: Secondary | ICD-10-CM | POA: Diagnosis not present

## 2023-03-10 LAB — RESPIRATORY PANEL BY PCR

## 2023-03-10 MED ORDER — K PHOS MONO-SOD PHOS DI & MONO 155-852-130 MG PO TABS
500.0000 mg | ORAL_TABLET | Freq: Two times a day (BID) | ORAL | Status: AC
  Administered 2023-03-10 (×2): 500 mg via ORAL
  Filled 2023-03-10 (×2): qty 2

## 2023-03-10 NOTE — Plan of Care (Signed)
  Problem: Education: Goal: Knowledge of General Education information will improve Description: Including pain rating scale, medication(s)/side effects and non-pharmacologic comfort measures Outcome: Progressing   Problem: Pain Managment: Goal: General experience of comfort will improve and/or be controlled Outcome: Progressing   Problem: Safety: Goal: Ability to remain free from injury will improve Outcome: Progressing

## 2023-03-10 NOTE — Telephone Encounter (Signed)
Melissa Hylton calling from [facility] is calling to report a miss OT visit this week. The patient was hospitalized. CB- [phone]

## 2023-03-10 NOTE — Plan of Care (Signed)
  Problem: Education: Goal: Knowledge of General Education information will improve Description: Including pain rating scale, medication(s)/side effects and non-pharmacologic comfort measures Outcome: Progressing   Problem: Clinical Measurements: Goal: Ability to maintain clinical measurements within normal limits will improve Outcome: Progressing   Problem: Nutrition: Goal: Adequate nutrition will be maintained Outcome: Progressing   Problem: Coping: Goal: Level of anxiety will decrease Outcome: Progressing   Problem: Activity: Goal: Risk for activity intolerance will decrease Outcome: Progressing

## 2023-03-10 NOTE — Consult Note (Signed)
Urology Consult  I have been asked to see the patient by Dr. Clide Dales, for evaluation and management of neurogenic bladder.  Chief Complaint: Diarrhea, anorexia  History of Present Illness: Rebecca [name] is a 63 y.o. year old female with PMH MS with neurogenic bladder managed with chronic Foley and seizure disorder admitted 2 days ago with sepsis due to multifocal bilateral pyelonephritis.  No evidence of hydronephrosis or obstructing stone on admission CTAP with contrast.  Admission urine culture growing gram-negative rods, blood cultures pending with no growth at <12 hours.  On antibiotics as below.  She is well-known to our practice with her history of neurogenic bladder.  She previously deferred suprapubic catheter placement due to issues with transportation.  She transports in a stretcher presently and was hoping she would become more able to transfer to wheelchair before pursuing SPT placement.  Anti-infectives (From admission, onward)    Start     Dose/Rate Route Frequency Ordered Stop   03/08/23 2200  meropenem (MERREM) 1 g in sodium chloride 0.9 % 100 mL IVPB        1 g 200 mL/hr over 30 Minutes Intravenous Every 8 hours 03/08/23 2038     03/08/23 2015  cefTRIAXone (ROCEPHIN) 2 g in sodium chloride 0.9 % 100 mL IVPB        2 g 200 mL/hr over 30 Minutes Intravenous Once 03/08/23 2000 03/09/23 0141        Past Medical History:  Diagnosis Date   Allergy    Aortic valve disease    Mild AS / AI - most recent Echo demonstrated tricuspid aortic valve.   Bacterial endocarditis    History of .   Bilateral impacted cerumen 06/29/2021   Bilateral lower extremity edema    Noncardiac.  Chronic. LE Venous dopplers - negative for DVT.; Echocardiogram January 2016: Normal EF with normal wall motion and valve function. Only grade 1 diastolic dysfunction. EF 60-65%. Mild MR   Breast cancer (HCC) 12/31/2013   Right breast, 12:00, 1.5 cm, T1c,N0 invasive mammary carcinoma, triple  negative. --> Rx with Chemo   Cervical stenosis of spine    GERD (gastroesophageal reflux disease) April or May 2022   Heart murmur 2013   Herpes zoster    IBS (irritable bowel syndrome)    Lymphedema    has legs wrapped at [facility]   Multiple sclerosis (HCC) 2001   Walks from room to room @ home; but Wheelchair when going out.   Neuromuscular disorder (HCC)    MS   PONV (postoperative nausea and vomiting)    Related to Fentanyl   Seizures (HCC)    Takes Keppra   SOB (shortness of breath) 03/01/2013   Syncope and collapse    Urinary tract infection associated with indwelling urethral catheter (HCC) 02/17/2020    Past Surgical History:  Procedure Laterality Date   ANKLE SURGERY     Left   ANKLE SURGERY     ANTERIOR CERVICAL DECOMP/DISCECTOMY FUSION  11/17/2011   Procedure: ANTERIOR CERVICAL DECOMPRESSION/DISCECTOMY FUSION 2 LEVELS;  Surgeon: Maeola Harman, MD;  Location: [facility];  Service: Neurosurgery;  Laterality: N/A;  Cervical Five-Six Six-Seven Anterior cervical decompression/diskectomy/fusion   BREAST BIOPSY Right 12/31/2013   invasive mammary   BREAST SURGERY Right 02/03/2014   Right simple mastectomy with sentinel node biopsy.   CHOLECYSTECTOMY     COLONOSCOPY  2014   ESOPHAGOGASTRODUODENOSCOPY (EGD) WITH PROPOFOL N/A 07/16/2020   Procedure: ESOPHAGOGASTRODUODENOSCOPY (EGD) WITH PROPOFOL;  Surgeon: Pasty Spillers,  MD;  Location: [facility];  Service: Endoscopy;  Laterality: N/A;  Patient has MS and will need assistance   HYSTEROSCOPY WITH D & C N/A 11/27/2018   Procedure: DILATATION AND CURETTAGE /HYSTEROSCOPY;  Surgeon: Nadara Mustard, MD;  Location: [facility] OR[facility];  Service: Gynecology;  Laterality: N/A;   Lower extremity venous Dopplers  02/27/2013   No LE DVT   MASTECTOMY Right 2015   ORIF TIBIA PLATEAU Left 12/09/2020   Procedure: LEFT OPEN REDUCTION INTERNAL FIXATION (ORIF) TIBIAL PLATEAU;  Surgeon: Roby Lofts, MD;  Location: [facility] OR;  Service:  Orthopedics;  Laterality: Left;   Port a cath insertion Right 01/19/2010   PORT-A-CATH REMOVAL     right   PORT-A-CATH REMOVAL Right 09/03/2013   Procedure: REMOVAL PORT-A-CATH;  Surgeon: Fransisco Hertz, MD;  Location: [facility] OR;  Service: Vascular;  Laterality: Right;   SPINE SURGERY  Cervical steinois Dr. Venetia Maxon 2014 I think?   TONSILLECTOMY     TRANSTHORACIC ECHOCARDIOGRAM  03/2013; 02/2014   a) Normal LV size and function with EF 60-65%.; Cannot exclude bicuspid aortic valve with mild AS and mild AI.; b) Normal EF with normal wall motion and valve function x Mild MR. G2 DD. EF 60-65%. Tricuspid AoV   UPPER GI ENDOSCOPY  2014    Home Medications:  Current Meds  Medication Sig   amantadine (SYMMETREL) 100 MG capsule TAKE 1 CAPSULE BY MOUTH THREE TIMES A DAY   baclofen (LIORESAL) 10 MG tablet Take 1-2 tablets (10-20 mg total) by mouth 3 (three) times daily.   gluconic acid-citric acid (RENACIDIN) irrigation Insert 30 mL into suprapubic tube and clamp tube for 10 minutes and then drain twice daily   Glucosamine-Chondroitin (COSAMIN DS PO) Take 1 tablet by mouth 3 (three) times daily.   levETIRAcetam (KEPPRA) 500 MG tablet Take 1 tablet (500 mg total) by mouth 2 (two) times daily.   lipase/protease/amylase (CREON) 36000 UNITS CPEP capsule Take 2 capsules (72,000 Units total) by mouth 3 (three) times daily with meals AND 1 capsule (36,000 Units total) with snacks. (Patient taking differently: Take 1-2 capsules (36,000- 72,000 Units total) by mouth with (three) times daily with meals AND 1 capsule (36,000 Units total) with snacks. (1 capsule by mouth with breakfast, 2 capsules by mouth with lunch and 1 capsule by mouth with dinner)  )   loratadine (CLARITIN) 10 MG tablet Take 10 mg by mouth daily.   Misc Natural Products (LEG VEIN & CIRCULATION) TABS Take 1 tablet by mouth 2 (two) times daily.    Multiple Vitamins-Minerals (HAIR SKIN AND NAILS FORMULA) TABS Take 1 tablet by mouth 2 (two) times daily.     Multiple Vitamins-Minerals (MULTIVITAMIN PO) Take 1 tablet by mouth daily.    nystatin (MYCOSTATIN/NYSTOP) powder Apply 1 application topically 3 (three) times daily as needed. For rash/raw skin as needed   omeprazole (PRILOSEC) 40 MG capsule TAKE 1 CAPSULE (40 MG TOTAL) BY MOUTH DAILY.   oxybutynin (DITROPAN-XL) 10 MG 24 hr tablet Take 1 tablet (10 mg total) by mouth 2 (two) times daily.   REBIF REBIDOSE 44 MCG/0.5ML SOAJ INJECT 1 PEN UNDER THE SKIN 3 TIMES PER WEEK    Allergies:  Allergies  Allergen Reactions   Fentanyl Nausea And Vomiting and Nausea Only    vomiting Was given in PACU x3 each time patient got sick    Sulfa Antibiotics Hives and Other (See Comments)    Light headed, over heated    Family History  Problem Relation Age  of Onset   Cancer Father        skin   Heart disease Father    Heart attack Father        heart attack in his 29's   Thyroid disease Sister    Ovarian cancer Cousin    Breast cancer Maternal Aunt 64   Breast cancer Maternal Grandmother 62   Bladder Cancer Neg Hx    Kidney cancer Neg Hx     Social History:  reports that she has never smoked. She has never used smokeless tobacco. She reports that she does not drink alcohol and does not use drugs.  ROS: A complete review of systems was performed.  All systems are negative except for pertinent findings as noted.  Physical Exam:  Vital signs in last 24 hours: Temp:  [98.2 F (36.8 C)-99 F (37.2 C)] 98.9 F (37.2 C) (01/31 0855) Pulse Rate:  [102-109] 108 (01/31 0855) Resp:  [16-24] 18 (01/31 0855) BP: (109-146)/(55-75) 146/75 (01/31 0855) SpO2:  [96 %-100 %] 96 % (01/31 0855) Weight:  [96.9 kg] 96.9 kg (01/31 0500) Constitutional:  Alert and oriented, no acute distress HEENT: [place] AT, moist mucus membranes Cardiovascular: No clubbing, cyanosis, or edema Respiratory: Normal respiratory effort Skin: No rashes, bruises or suspicious lesions Psychiatric: Normal mood and affect  Laboratory  Data:  Recent Labs    03/08/23 1514 03/09/23 0557  WBC 15.1* 14.3*  HGB 13.6 13.2  HCT 39.2 39.9   Recent Labs    03/08/23 1514 03/09/23 0557  NA 128* 132*  K 3.1* 3.8  CL 96* 103  CO2 19* 19*  GLUCOSE 140* 110*  BUN 26* 22  CREATININE 0.69 0.59  CALCIUM 8.7* 8.6*   Recent Labs    03/08/23 1514  INR 1.2   Urinalysis    Component Value Date/Time   COLORURINE YELLOW (A) 03/08/2023 1514   APPEARANCEUR TURBID (A) 03/08/2023 1514   APPEARANCEUR Clear 04/03/2017 1409   LABSPEC 1.016 03/08/2023 1514   LABSPEC 1.014 02/28/2011 1534   PHURINE 8.0 03/08/2023 1514   GLUCOSEU NEGATIVE 03/08/2023 1514   GLUCOSEU Negative 02/28/2011 1534   HGBUR NEGATIVE 03/08/2023 1514   BILIRUBINUR NEGATIVE 03/08/2023 1514   BILIRUBINUR negative 07/16/2018 1453   BILIRUBINUR Negative 04/03/2017 1409   BILIRUBINUR Negative 02/28/2011 1534   KETONESUR 5 (A) 03/08/2023 1514   PROTEINUR >=300 (A) 03/08/2023 1514   UROBILINOGEN negative (A) 07/16/2018 1453   NITRITE NEGATIVE 03/08/2023 1514   LEUKOCYTESUR SMALL (A) 03/08/2023 1514   LEUKOCYTESUR Trace 02/28/2011 1534   Results for orders placed or performed during the hospital encounter of 03/08/23  C Difficile Quick Screen w PCR reflex     Status: None   Collection Time: 03/08/23  6:20 AM   Specimen: STOOL  Result Value Ref Range Status   C Diff antigen NEGATIVE NEGATIVE Final   C Diff toxin NEGATIVE NEGATIVE Final   C Diff interpretation No C. difficile detected.  Final    Comment: Performed at [facility], [address] [place], [place] [address]  Gastrointestinal Panel by PCR , Stool     Status: None   Collection Time: 03/08/23  6:20 AM   Specimen: Stool  Result Value Ref Range Status   Campylobacter species NOT DETECTED NOT DETECTED Final   Plesimonas shigelloides NOT DETECTED NOT DETECTED Final   Salmonella species NOT DETECTED NOT DETECTED Final   Yersinia enterocolitica NOT DETECTED NOT DETECTED Final   Vibrio  species NOT DETECTED NOT DETECTED Final  Vibrio cholerae NOT DETECTED NOT DETECTED Final   Enteroaggregative E coli (EAEC) NOT DETECTED NOT DETECTED Final   Enteropathogenic E coli (EPEC) NOT DETECTED NOT DETECTED Final   Enterotoxigenic E coli (ETEC) NOT DETECTED NOT DETECTED Final   Shiga like toxin producing E coli (STEC) NOT DETECTED NOT DETECTED Final   Shigella/Enteroinvasive E coli (EIEC) NOT DETECTED NOT DETECTED Final   Cryptosporidium NOT DETECTED NOT DETECTED Final   Cyclospora cayetanensis NOT DETECTED NOT DETECTED Final   Entamoeba histolytica NOT DETECTED NOT DETECTED Final   Giardia lamblia NOT DETECTED NOT DETECTED Final   Adenovirus F40/41 NOT DETECTED NOT DETECTED Final   Astrovirus NOT DETECTED NOT DETECTED Final   Norovirus GI/GII NOT DETECTED NOT DETECTED Final   Rotavirus A NOT DETECTED NOT DETECTED Final   Sapovirus (I, II, IV, and V) NOT DETECTED NOT DETECTED Final    Comment: Performed at [facility], [address] [place], [place] [address]  Culture, blood (Routine x 2)     Status: None (Preliminary result)   Collection Time: 03/08/23  3:14 PM   Specimen: BLOOD  Result Value Ref Range Status   Specimen Description BLOOD BLOOD RIGHT ARM  Final   Special Requests   Final    BOTTLES DRAWN AEROBIC AND ANAEROBIC Blood Culture adequate volume   Culture   Final    NO GROWTH 2 DAYS Performed at [facility], [address] [place], [place] [address]    Report Status PENDING  Incomplete  Urine Culture (for pregnant, neutropenic or urologic patients or patients with an indwelling urinary catheter)     Status: Abnormal (Preliminary result)   Collection Time: 03/08/23  3:14 PM   Specimen: Urine, Clean Catch  Result Value Ref Range Status   Specimen Description   Final    URINE, CLEAN CATCH Performed at [facility], [address], [place], [place] [address]    Special Requests   Final    NONE Performed at [facility],  [address], [place], [place] [address]    Culture >=100,000 COLONIES/mL GRAM NEGATIVE RODS (A)  Final   Report Status PENDING  Incomplete  Resp panel by RT-PCR (RSV, Flu A&B, Covid) Anterior Nasal Swab     Status: None   Collection Time: 03/08/23  4:42 PM   Specimen: Anterior Nasal Swab  Result Value Ref Range Status   SARS Coronavirus 2 by RT PCR NEGATIVE NEGATIVE Final    Comment: (NOTE) SARS-CoV-2 target nucleic acids are NOT DETECTED.  The SARS-CoV-2 RNA is generally detectable in upper respiratory specimens during the acute phase of infection. The lowest concentration of SARS-CoV-2 viral copies this assay can detect is 138 copies/mL. A negative result does not preclude SARS-Cov-2 infection and should not be used as the sole basis for treatment or other patient management decisions. A negative result may occur with  improper specimen collection/handling, submission of specimen other than nasopharyngeal swab, presence of viral mutation(s) within the areas targeted by this assay, and inadequate number of viral copies(<138 copies/mL). A negative result must be combined with clinical observations, patient history, and epidemiological information. The expected result is Negative.  Fact Sheet for Patients:  BloggerCourse.com  Fact Sheet for Healthcare Providers:  SeriousBroker.it  This test is no t yet approved or cleared by the [place] FDA and  has been authorized for detection and/or diagnosis of SARS-CoV-2 by FDA under an Emergency Use Authorization (EUA). This EUA will remain  in effect (meaning this test  can be used) for the duration of the COVID-19 declaration under Section 564(b)(1) of the Act, 21 U.S.C.section 360bbb-3(b)(1), unless the authorization is terminated  or revoked sooner.       Influenza A by PCR NEGATIVE NEGATIVE Final   Influenza B by PCR NEGATIVE NEGATIVE Final    Comment: (NOTE) The Xpert  Xpress SARS-CoV-2/FLU/RSV plus assay is intended as an aid in the diagnosis of influenza from Nasopharyngeal swab specimens and should not be used as a sole basis for treatment. Nasal washings and aspirates are unacceptable for Xpert Xpress SARS-CoV-2/FLU/RSV testing.  Fact Sheet for Patients: BloggerCourse.com  Fact Sheet for Healthcare Providers: SeriousBroker.it  This test is not yet approved or cleared by the [place] FDA and has been authorized for detection and/or diagnosis of SARS-CoV-2 by FDA under an Emergency Use Authorization (EUA). This EUA will remain in effect (meaning this test can be used) for the duration of the COVID-19 declaration under Section 564(b)(1) of the Act, 21 U.S.C. section 360bbb-3(b)(1), unless the authorization is terminated or revoked.     Resp Syncytial Virus by PCR NEGATIVE NEGATIVE Final    Comment: (NOTE) Fact Sheet for Patients: BloggerCourse.com  Fact Sheet for Healthcare Providers: SeriousBroker.it  This test is not yet approved or cleared by the [place] FDA and has been authorized for detection and/or diagnosis of SARS-CoV-2 by FDA under an Emergency Use Authorization (EUA). This EUA will remain in effect (meaning this test can be used) for the duration of the COVID-19 declaration under Section 564(b)(1) of the Act, 21 U.S.C. section 360bbb-3(b)(1), unless the authorization is terminated or revoked.  Performed at [facility], [address] [place], [place] [address]   Respiratory (~20 pathogens) panel by PCR     Status: None   Collection Time: 03/09/23  3:44 PM   Specimen: Nasopharyngeal Swab; Respiratory  Result Value Ref Range Status   Adenovirus NOT DETECTED NOT DETECTED Final   Coronavirus 229E NOT DETECTED NOT DETECTED Final    Comment: (NOTE) The Coronavirus on the Respiratory Panel, DOES NOT test for  the novel  Coronavirus (2019 nCoV)    Coronavirus HKU1 NOT DETECTED NOT DETECTED Final   Coronavirus NL63 NOT DETECTED NOT DETECTED Final   Coronavirus OC43 NOT DETECTED NOT DETECTED Final   Metapneumovirus NOT DETECTED NOT DETECTED Final   Rhinovirus / Enterovirus NOT DETECTED NOT DETECTED Final   Influenza A NOT DETECTED NOT DETECTED Final   Influenza B NOT DETECTED NOT DETECTED Final   Parainfluenza Virus 1 NOT DETECTED NOT DETECTED Final   Parainfluenza Virus 2 NOT DETECTED NOT DETECTED Final   Parainfluenza Virus 3 NOT DETECTED NOT DETECTED Final   Parainfluenza Virus 4 NOT DETECTED NOT DETECTED Final   Respiratory Syncytial Virus NOT DETECTED NOT DETECTED Final   Bordetella pertussis NOT DETECTED NOT DETECTED Final   Bordetella Parapertussis NOT DETECTED NOT DETECTED Final   Chlamydophila pneumoniae NOT DETECTED NOT DETECTED Final   Mycoplasma pneumoniae NOT DETECTED NOT DETECTED Final    Comment: Performed at [facility], [address], [place], [place] [address]  Culture, blood (Routine x 2)     Status: None (Preliminary result)   Collection Time: 03/09/23  4:13 PM   Specimen: BLOOD  Result Value Ref Range Status   Specimen Description BLOOD LEFT ANTECUBITAL  Final   Special Requests   Final    BOTTLES DRAWN AEROBIC AND ANAEROBIC Blood Culture results may not be optimal due to an inadequate volume of blood received  in culture bottles   Culture   Final    NO GROWTH < 12 HOURS Performed at [facility], [address] [place], [place] [address]    Report Status PENDING  Incomplete   *Note: Due to a large number of results and/or encounters for the requested time period, some results have not been displayed. A complete set of results can be found in Results Review.    Radiologic Imaging: CT ABDOMEN PELVIS W CONTRAST Result Date: 03/08/2023 CLINICAL DATA:  Abdominal pain.  Diarrhea. EXAM: CT ABDOMEN AND PELVIS WITH CONTRAST TECHNIQUE: Multidetector CT  imaging of the abdomen and pelvis was performed using the standard protocol following bolus administration of intravenous contrast. RADIATION DOSE REDUCTION: This exam was performed according to the departmental dose-optimization program which includes automated exposure control, adjustment of the mA and/or kV according to patient size and/or use of iterative reconstruction technique. CONTRAST:  OMNIPAQUE IOHEXOL 300 MG/ML  SOLN COMPARISON:  None Available. FINDINGS: Lower chest: Lung bases are clear. Hepatobiliary: No focal hepatic lesion. Postcholecystectomy. No biliary dilatation. Pancreas: Pancreas is normal. No ductal dilatation. No pancreatic inflammation. Spleen: Normal spleen Adrenals/urinary tract: Adrenal glands are normal. Kidneys enhance symmetrically. There is a hypoenhancement in the cortex of the mid LEFT kidney RIGHT kidney over a 2.5 cm segment (image 36/2) similar cortical hypoenhancement to lesser degree in the upper pole of the LEFT kidney (image 23/2) ureters are normal. No ureteral enhancement. Foley catheter within collapsed bladder. There is mucosal enhancement within the collapsed bladder. On delayed imaging there is no filling defects within collecting systems or proximal ureters. The cortical hypoenhancement is evident on delayed imaging with 2 foci in the RIGHT kidney. Region of hypoenhancement in the LEFT kidney is more prominent on delayed phase. Stomach/Bowel: Large hiatal hernia. Stomach, duodenum small-bowel normal. Post appendectomy. Small volume of free fluid along the RIGHT pericolic gutter. No evidence of colonic inflammation. Moderate volume stool. Vascular/Lymphatic: Abdominal aorta is normal caliber with atherosclerotic calcification. There is no retroperitoneal or periportal lymphadenopathy. No pelvic lymphadenopathy. Reproductive: Uterus and adnexa unremarkable. Other: No free fluid. Musculoskeletal: No aggressive osseous lesion. IMPRESSION: 1. Segments of cortical  hypoenhancement in the RIGHT kidney and LEFT kidney most consistent with multifocal focal pyelonephritis. Renal infarctions are considered but not favored. 2. No evidence of ureteritis. 3. Mucosal enhancement within the collapsed bladder suggests cystitis. Foley catheter in place. 4. No evidence of bowel inflammation. Electronically Signed   By: Genevive Bi M.D.   On: 03/08/2023 19:47   DG Chest Port 1 View Result Date: 03/08/2023 CLINICAL DATA:  Fever. EXAM: PORTABLE CHEST 1 VIEW COMPARISON:  12/14/2020 FINDINGS: Stable positioning of left chest port. The heart is normal in size, mediastinal contours are stable. Mild retrocardiac atelectasis or scarring. No confluent airspace disease. No pleural effusion or pneumothorax. IMPRESSION: Retrocardiac atelectasis or scarring. No confluent airspace disease. Electronically Signed   By: Narda Rutherford M.D.   On: 03/08/2023 16:39   Assessment & Plan:  63 year old female with PMH MS with neurogenic bladder managed with chronic indwelling Foley catheter and seizure disorder admitted with sepsis due to multifocal, bilateral pyelonephritis.  Agree with antibiotics per primary team.  We discussed that since she is able to coordinate transportation on a stretcher to appointments, this should not preclude her from pursuing suprapubic catheter if she desires to have one placed.  She expressed understanding and would like to pursue this.  We discussed that we will need to pursue it on an outpatient basis once  her infection is treated.  She is in agreement with this plan.  Recommendations: -Follow cultures and continue antibiotics for total of 14 days of culture appropriate therapy -Continue Foley catheter with monthly exchanges pending transition to suprapubic catheter -I will refer her on an outpatient basis to IR for placement of SPT  Thank you for involving me in this patient's care, please page with any further questions or concerns.  Carman Ching, PA-C 03/10/2023 9:53 AM

## 2023-03-10 NOTE — TOC Progression Note (Signed)
Transition of Care [facility]) - Progression Note    Patient Details  Name: Rebecca [name] MRN: [id number] Date of Birth: Mar 29, 1960  [facility] of [facility]) CM/SW Contact  Marlowe Sax, RN Phone Number: 03/10/2023, 12:02 PM  Clinical Narrative:     chronic indwelling Foley catheter and seizure disorder    Saw Urology here ands they discussed that they will need to pursue pursuing suprapubic catheter  on an outpatient basis once her infection is treated. She is in agreement with this plan.    She is open with [facility] services  Expected Discharge Plan and Services                                               Social Determinants of Health (SDOH) Interventions SDOH Screenings   Food Insecurity: No Food Insecurity (03/09/2023)  Housing: Low Risk  (03/09/2023)  Transportation Needs: Unmet Transportation Needs (03/09/2023)  Utilities: Not At Risk (03/09/2023)  Alcohol Screen: Low Risk  (08/17/2021)  Depression (PHQ2-9): Low Risk  (02/20/2023)  Financial Resource Strain: Low Risk  (12/03/2022)  Physical Activity: Inactive (12/03/2022)  Social Connections: Socially Isolated (03/09/2023)  Stress: No Stress Concern Present (12/03/2022)  Tobacco Use: Low Risk  (03/09/2023)  Health Literacy: Adequate Health Literacy (09/29/2022)    Readmission Risk Interventions     No data to display

## 2023-03-10 NOTE — Progress Notes (Signed)
Progress Note   Patient: Rebecca [name] WUJ:[id number] DOB: January 05, 1961 DOA: 03/08/2023     2 DOS: the patient was seen and examined on 03/10/2023   Brief hospital course: Rebecca [name] is a 63 y.o. female with medical history significant of MS, spasmatic paraplegia, bed-bound  neurogenic bladder with chronic Foley catheter placement, diastolic CHF, cardiac, chronic pancreatic insufficiency, lymphedema, breast cancer (s/p of right mastectomy), endocarditis, seizure, who presents with weakness, fever, chills, diarrhea.   CT abdomen revealed segments of cortical hypoenhancement in the right kidney and left kidney most consistent with multifocal focal pyelonephritis.  Patient is admitted to hospitalist service for sepsis secondary to acute pyelonephritis, started on IV hydration, IV meropenem therapy due to history of ESBL.  Assessment and Plan: Sepsis due to acute pyelonephritis:  Presented with low-grade fever, tachycardia, WBC 15.1, Lactic acid is normal.  Procalcitonin 1.48.   Fever improved. Still tachycardic. Recheck CBC. Continue IV meropenem due to history of ESBL (patient received 1 dose of Rocephin and vancomycin in ED) Follow-up of blood culture and urine culture Multiple issues with foley, urology consulted who advised 14 days antibiotic therapy, outpatient IR placement of suprapubic.   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 Continue seizure precautions   MS (multiple sclerosis) with spasmatic paraplegia: Continue home dose amantadine, baclofen, tizanidine.   Neurogenic bladder She has chronic Foley catheter which was exchanged in ED Continue oxybutynin   Pancreatic insufficiency Continue Creon. Diarrhea much improved.  She denies nausea or abdominal discomfort   Chronic diastolic CHF (congestive heart failure) (HCC): 2D echo on 03/14/2016 showed EF of 65% with grade 1 diastolic  dysfunction.  Patient has chronic lymphedema in both legs, but denies shortness of breath, no pulm edema on chest x-ray.  She is euvolemic. Continue to monitor for volume overload and diuretic need.   Hyponatremia:  Sodium 132, improved with gentle IV hydration Sodium chloride tablet 1 g twice for another day. Encourage oral fluids, diet.   Hypokalemia and hypophosphatemia:  Continue the KPhos supplementation. Continue to monitor electrolytes.   Abnormal LFTs: Mild.  Possibly due to sepsis. Avoid hepatotoxic drugs. Trend LFT.     She is bed bound. Continue nursing supportive care. Fall, aspiration precautions. DVT prophylaxis   Code Status: Full Code  Subjective: Patient is seen and examined today morning. She is lying comfortably.  Remains afebrile.  Denies nausea, vomiting, abdominal pain. Eating fair.   Physical Exam: Vitals:   03/09/23 1636 03/09/23 2343 03/10/23 0500 03/10/23 0855  BP: 130/68 113/69  (!) 146/75  Pulse: (!) 105 (!) 102  (!) 108  Resp: 16 18  18   Temp: 99 F (37.2 C) 98.3 F (36.8 C)  98.9 F (37.2 C)  TempSrc:  Oral    SpO2: 100% 99%  96%  Weight:   96.9 kg   Height:        General - Middle aged obese ill Caucasian female, no respiratory distress HEENT - PERRLA, EOMI, atraumatic head, non tender sinuses. Lung - Clear, no rales, diffuse rhonchi, wheezes. Heart - S1, S2 heard, no murmurs, rubs, 1+ pedal edema. Abdomen - Soft, non tender obese, Foley cath intact Neuro - Alert, awake and oriented x 3, non focal exam. Skin - Warm and dry.  Data Reviewed:      Latest Ref Rng & Units 03/09/2023    5:57 AM 03/08/2023    3:14 PM 12/07/2022   11:17 AM  CBC  WBC 4.0 - 10.5 K/uL 14.3  15.1  5.2   Hemoglobin 12.0 - 15.0 g/dL 16.1  09.6  04.5   Hematocrit 36.0 - 46.0 % 39.9  39.2  42.4   Platelets 150 - 400 K/uL 133  159  171       Latest Ref Rng & Units 03/09/2023    5:57 AM 03/08/2023    3:14 PM 12/07/2022   11:17 AM  BMP  Glucose 70 - 99  mg/dL 409  811  99   BUN 8 - 23 mg/dL 22  26  10    Creatinine 0.44 - 1.00 mg/dL 9.14  7.82  9.56   BUN/Creat Ratio 6 - 22 (calc)   SEE NOTE:   Sodium 135 - 145 mmol/L 132  128  139   Potassium 3.5 - 5.1 mmol/L 3.8  3.1  4.2   Chloride 98 - 111 mmol/L 103  96  102   CO2 22 - 32 mmol/L 19  19  30    Calcium 8.9 - 10.3 mg/dL 8.6  8.7  9.8    CT ABDOMEN PELVIS W CONTRAST Result Date: 03/08/2023 CLINICAL DATA:  Abdominal pain.  Diarrhea. EXAM: CT ABDOMEN AND PELVIS WITH CONTRAST TECHNIQUE: Multidetector CT imaging of the abdomen and pelvis was performed using the standard protocol following bolus administration of intravenous contrast. RADIATION DOSE REDUCTION: This exam was performed according to the departmental dose-optimization program which includes automated exposure control, adjustment of the mA and/or kV according to patient size and/or use of iterative reconstruction technique. CONTRAST:  OMNIPAQUE IOHEXOL 300 MG/ML  SOLN COMPARISON:  None Available. FINDINGS: Lower chest: Lung bases are clear. Hepatobiliary: No focal hepatic lesion. Postcholecystectomy. No biliary dilatation. Pancreas: Pancreas is normal. No ductal dilatation. No pancreatic inflammation. Spleen: Normal spleen Adrenals/urinary tract: Adrenal glands are normal. Kidneys enhance symmetrically. There is a hypoenhancement in the cortex of the mid LEFT kidney RIGHT kidney over a 2.5 cm segment (image 36/2) similar cortical hypoenhancement to lesser degree in the upper pole of the LEFT kidney (image 23/2) ureters are normal. No ureteral enhancement. Foley catheter within collapsed bladder. There is mucosal enhancement within the collapsed bladder. On delayed imaging there is no filling defects within collecting systems or proximal ureters. The cortical hypoenhancement is evident on delayed imaging with 2 foci in the RIGHT kidney. Region of hypoenhancement in the LEFT kidney is more prominent on delayed phase. Stomach/Bowel: Large hiatal  hernia. Stomach, duodenum small-bowel normal. Post appendectomy. Small volume of free fluid along the RIGHT pericolic gutter. No evidence of colonic inflammation. Moderate volume stool. Vascular/Lymphatic: Abdominal aorta is normal caliber with atherosclerotic calcification. There is no retroperitoneal or periportal lymphadenopathy. No pelvic lymphadenopathy. Reproductive: Uterus and adnexa unremarkable. Other: No free fluid. Musculoskeletal: No aggressive osseous lesion. IMPRESSION: 1. Segments of cortical hypoenhancement in the RIGHT kidney and LEFT kidney most consistent with multifocal focal pyelonephritis. Renal infarctions are considered but not favored. 2. No evidence of ureteritis. 3. Mucosal enhancement within the collapsed bladder suggests cystitis. Foley catheter in place. 4. No evidence of bowel inflammation. Electronically Signed   By: Genevive Bi M.D.   On: 03/08/2023 19:47   DG Chest Port 1 View Result Date: 03/08/2023 CLINICAL DATA:  Fever. EXAM: PORTABLE CHEST 1 VIEW COMPARISON:  12/14/2020 FINDINGS: Stable positioning of left chest port. The heart is normal in size, mediastinal contours are stable. Mild retrocardiac atelectasis or scarring. No confluent airspace disease. No pleural effusion or pneumothorax. IMPRESSION: Retrocardiac atelectasis or scarring. No  confluent airspace disease. Electronically Signed   By: Narda Rutherford M.D.   On: 03/08/2023 16:39   Family Communication: Discussed with patient, she understand and agree. All questions answereed.  Disposition: Status is: Inpatient Remains inpatient appropriate because: Sepsis on IV antibiotics.  Planned Discharge Destination: Home with Home Health     Time spent: 40 minutes  Author: Marcelino Duster, MD 03/10/2023 2:00 PM Secure chat 7am to 7pm For on call review [email or web address].

## 2023-03-11 DIAGNOSIS — N10 Acute tubulo-interstitial nephritis: Secondary | ICD-10-CM | POA: Diagnosis not present

## 2023-03-11 LAB — BLOOD CULTURE ID PANEL (REFLEXED) - BCID2

## 2023-03-11 LAB — CBC
HCT: 30.1 % — ABNORMAL LOW (ref 36.0–46.0)
Hemoglobin: 10.3 g/dL — ABNORMAL LOW (ref 12.0–15.0)
MCH: 29.6 pg (ref 26.0–34.0)
MCHC: 34.2 g/dL (ref 30.0–36.0)
MCV: 86.5 fL (ref 80.0–100.0)
Platelets: 149 10*3/uL — ABNORMAL LOW (ref 150–400)
RBC: 3.48 MIL/uL — ABNORMAL LOW (ref 3.87–5.11)
RDW: 12.9 % (ref 11.5–15.5)
WBC: 6.9 10*3/uL (ref 4.0–10.5)
nRBC: 0 % (ref 0.0–0.2)

## 2023-03-11 LAB — BASIC METABOLIC PANEL
Anion gap: 8 (ref 5–15)
BUN: 12 mg/dL (ref 8–23)
CO2: 23 mmol/L (ref 22–32)
Calcium: 8 mg/dL — ABNORMAL LOW (ref 8.9–10.3)
Chloride: 104 mmol/L (ref 98–111)
Creatinine, Ser: 0.41 mg/dL — ABNORMAL LOW (ref 0.44–1.00)
GFR, Estimated: >60 mL/min (ref >60)
Glucose, Bld: 86 mg/dL (ref 70–99)
Potassium: 2.9 mmol/L — ABNORMAL LOW (ref 3.5–5.1)
Sodium: 135 mmol/L (ref 135–145)

## 2023-03-11 LAB — PHOSPHORUS: Phosphorus: 2.8 mg/dL (ref 2.5–4.6)

## 2023-03-11 MED ORDER — POTASSIUM CHLORIDE CRYS ER 20 MEQ PO TBCR
40.0000 meq | EXTENDED_RELEASE_TABLET | Freq: Once | ORAL | Status: AC
  Administered 2023-03-11: 40 meq via ORAL
  Filled 2023-03-11: qty 2

## 2023-03-11 MED ORDER — POTASSIUM CHLORIDE CRYS ER 20 MEQ PO TBCR
20.0000 meq | EXTENDED_RELEASE_TABLET | Freq: Once | ORAL | Status: AC
  Administered 2023-03-11: 20 meq via ORAL
  Filled 2023-03-11: qty 1

## 2023-03-11 MED ORDER — POTASSIUM CHLORIDE 10 MEQ/100ML IV SOLN: 10.0000 meq | INTRAVENOUS | Status: DC

## 2023-03-11 MED ORDER — CHLORHEXIDINE GLUCONATE CLOTH 2 % EX PADS
6.0000 | MEDICATED_PAD | Freq: Every day | CUTANEOUS | Status: DC
  Administered 2023-03-11 – 2023-03-13 (×3): 6 via TOPICAL

## 2023-03-11 NOTE — Progress Notes (Signed)
PHARMACY - PHYSICIAN COMMUNICATION CRITICAL VALUE ALERT - BLOOD CULTURE IDENTIFICATION (BCID)  Rebecca [name] is an 63 y.o. female who presented to [facility] on 03/08/2023 with a chief complaint of sepsis, possible ESBL  Assessment:  E Coli growing in 1 of 4 bottles, no resistance detected.  (include suspected source if known)  Name of physician (or Provider) Contacted: Manuela Schwartz, NP   Current antibiotics: meropenem 1 gm IV Q8H   Changes to prescribed antibiotics recommended:  Recommendations declined by provider due to history of resistant organism - suggested changing abx to ceftriaxone 2 gm IV Q24H.  NP wants to continue meropenem til urine cx result.   Results for orders placed or performed during the hospital encounter of 03/08/23  Blood Culture ID Panel (Reflexed) (Collected: 03/08/2023  3:14 PM)  Result Value Ref Range   Enterococcus faecalis NOT DETECTED NOT DETECTED   Enterococcus Faecium NOT DETECTED NOT DETECTED   Listeria monocytogenes NOT DETECTED NOT DETECTED   Staphylococcus species NOT DETECTED NOT DETECTED   Staphylococcus aureus (BCID) NOT DETECTED NOT DETECTED   Staphylococcus epidermidis NOT DETECTED NOT DETECTED   Staphylococcus lugdunensis NOT DETECTED NOT DETECTED   Streptococcus species NOT DETECTED NOT DETECTED   Streptococcus agalactiae NOT DETECTED NOT DETECTED   Streptococcus pneumoniae NOT DETECTED NOT DETECTED   Streptococcus pyogenes NOT DETECTED NOT DETECTED   A.calcoaceticus-baumannii NOT DETECTED NOT DETECTED   Bacteroides fragilis NOT DETECTED NOT DETECTED   Enterobacterales DETECTED (A) NOT DETECTED   Enterobacter cloacae complex NOT DETECTED NOT DETECTED   Escherichia coli DETECTED (A) NOT DETECTED   Klebsiella aerogenes NOT DETECTED NOT DETECTED   Klebsiella oxytoca NOT DETECTED NOT DETECTED   Klebsiella pneumoniae NOT DETECTED NOT DETECTED   Proteus species NOT DETECTED NOT DETECTED   Salmonella species NOT DETECTED NOT DETECTED    Serratia marcescens NOT DETECTED NOT DETECTED   Haemophilus influenzae NOT DETECTED NOT DETECTED   Neisseria meningitidis NOT DETECTED NOT DETECTED   Pseudomonas aeruginosa NOT DETECTED NOT DETECTED   Stenotrophomonas maltophilia NOT DETECTED NOT DETECTED   Candida albicans NOT DETECTED NOT DETECTED   Candida auris NOT DETECTED NOT DETECTED   Candida glabrata NOT DETECTED NOT DETECTED   Candida krusei NOT DETECTED NOT DETECTED   Candida parapsilosis NOT DETECTED NOT DETECTED   Candida tropicalis NOT DETECTED NOT DETECTED   Cryptococcus neoformans/gattii NOT DETECTED NOT DETECTED   CTX-M ESBL NOT DETECTED NOT DETECTED   Carbapenem resistance IMP NOT DETECTED NOT DETECTED   Carbapenem resistance KPC NOT DETECTED NOT DETECTED   Carbapenem resistance NDM NOT DETECTED NOT DETECTED   Carbapenem resist OXA 48 LIKE NOT DETECTED NOT DETECTED   Carbapenem resistance VIM NOT DETECTED NOT DETECTED    Norvin Ohlin D 03/11/2023  1:03 AM

## 2023-03-11 NOTE — Plan of Care (Signed)
  Problem: Education: Goal: Knowledge of General Education information will improve Description: Including pain rating scale, medication(s)/side effects and non-pharmacologic comfort measures Outcome: Progressing   Problem: Health Behavior/Discharge Planning: Goal: Ability to manage health-related needs will improve Outcome: Progressing   Problem: Clinical Measurements: Goal: Ability to maintain clinical measurements within normal limits will improve Outcome: Progressing Goal: Will remain free from infection Outcome: Progressing Goal: Diagnostic test results will improve Outcome: Progressing Goal: Respiratory complications will improve Outcome: Progressing Goal: Cardiovascular complication will be avoided Outcome: Progressing   Problem: Coping: Goal: Level of anxiety will decrease Outcome: Progressing   Problem: Safety: Goal: Ability to remain free from injury will improve Outcome: Progressing   Problem: Skin Integrity: Goal: Risk for impaired skin integrity will decrease Outcome: Progressing   

## 2023-03-11 NOTE — Progress Notes (Addendum)
[facility]  - [place] at [facility]   PATIENT NAME: Rebecca [name]    MR#:  [id number]  DATE OF BIRTH:  July 22, 1960  SUBJECTIVE:   No family at bedside. Overall doing well. Wondering when she can go home.   VITALS:  Blood pressure (!) 153/84, pulse 91, temperature 98.4 F (36.9 C), resp. rate 16, height 5\' 4"  (1.626 m), weight 94.7 kg, SpO2 97%.  PHYSICAL EXAMINATION:   GENERAL:  63 y.o.-year-old patient with no acute distress.  LUNGS: Normal breath sounds bilaterally, no wheezing CARDIOVASCULAR: S1, S2 normal. No murmur   ABDOMEN: Soft, nontender, nondistended. Chronic Foley EXTREMITIES: No  edema b/l.    NEUROLOGIC:  patient is alert and awake generalize deconditioning and weakness  LABORATORY PANEL:  CBC Recent Labs  Lab 03/11/23 0517  WBC 6.9  HGB 10.3*  HCT 30.1*  PLT 149*    Chemistries  Recent Labs  Lab 03/08/23 1514 03/09/23 0557 03/11/23 0517  NA 128* 132* 135  K 3.1* 3.8 2.9*  CL 96* 103 104  CO2 19* 19* 23  GLUCOSE 140* 110* 86  BUN 26* 22 12  CREATININE 0.69 0.59 0.41*  CALCIUM 8.7* 8.6* 8.0*  MG 1.8  --   --   AST 45* 76*  --   ALT 45* 61*  --   ALKPHOS 84 78  --   BILITOT 1.3* 0.8  --    Assessment and Plan  Rebecca [name] is a 63 y.o. female with medical history significant of MS, spasmatic paraplegia, bed-bound  neurogenic bladder with chronic Foley catheter placement, diastolic CHF, cardiac, chronic pancreatic insufficiency, lymphedema, breast cancer (s/p of right mastectomy), endocarditis, seizure, who presents with weakness, fever, chills, diarrhea.    CT abdomen revealed segments of cortical hypoenhancement in the right kidney and left kidney most consistent with multifocal focal pyelonephritis.  Patient is admitted to hospitalist service for sepsis secondary to acute pyelonephritis, started on IV hydration, IV meropenem therapy due to history of ESBL.  Sepsis due to acute pyelonephritis in the setting of chronic Foley  catheter --Presented with low-grade fever, tachycardia, WBC 15.1, Lactic acid is normal.  Procalcitonin 1.48.   --Continue IV meropenem due to history of ESBL (patient received 1 dose of Rocephin and vancomycin in ED) --Multiple issues with foley, urology consulted who advised 14 days antibiotic therapy, outpatient IR placement of suprapubic. -- Blood culture E. 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 --Continue seizure precautions   MS (multiple sclerosis) with spasmatic paraplegia: --Continue home dose amantadine, baclofen, tizanidine.   Neurogenic bladder --She has chronic Foley catheter which was exchanged in ED --Continue oxybutynin   Pancreatic insufficiency --Continue Creon. --Diarrhea much improved.     Chronic diastolic CHF (congestive heart failure) (HCC): 2D echo on 03/14/2016 showed EF of 65% with grade 1 diastolic dysfunction.  Patient has chronic lymphedema in both legs, but denies shortness of breath, no pulm edema on chest x-ray.  She is euvolemic.   Hyponatremia:  Sodium 132, improved with gentle IV hydration Encourage oral fluids, diet.   Hypokalemia and hypophosphatemia:  --Continue the KPhos supplementation.   Abnormal LFTs: Mild.  Possibly due to sepsis. Avoid hepatotoxic drugs. Trend LFT.       She is bed bound. Continue nursing supportive care.    Family communication :none today Consults : urology  CODE STATUS: full code DVT Prophylaxis : enoxaparin Level of care: Telemetry Medical Status is: Inpatient Remains inpatient appropriate because: awaiting for blood culture and urine culture sensitivities    TOTAL TIME TAKING CARE OF THIS PATIENT: 35 minutes.  >50% time spent on counselling and coordination of care  Note: This dictation was prepared with Dragon  dictation along with smaller phrase technology. Any transcriptional errors that result from this process are unintentional.  Enedina Finner M.D    [facility]   CC: Primary care physician; Danelle Berry, PA-C

## 2023-03-12 DIAGNOSIS — G35 Multiple sclerosis: Secondary | ICD-10-CM | POA: Diagnosis not present

## 2023-03-12 DIAGNOSIS — A4151 Sepsis due to Escherichia coli [E. coli]: Secondary | ICD-10-CM | POA: Diagnosis not present

## 2023-03-12 DIAGNOSIS — N319 Neuromuscular dysfunction of bladder, unspecified: Secondary | ICD-10-CM | POA: Diagnosis not present

## 2023-03-12 DIAGNOSIS — N10 Acute tubulo-interstitial nephritis: Secondary | ICD-10-CM | POA: Diagnosis not present

## 2023-03-12 LAB — BASIC METABOLIC PANEL
Anion gap: 8 (ref 5–15)
BUN: 10 mg/dL (ref 8–23)
CO2: 24 mmol/L (ref 22–32)
Calcium: 8.4 mg/dL — ABNORMAL LOW (ref 8.9–10.3)
Chloride: 104 mmol/L (ref 98–111)
Creatinine, Ser: 0.39 mg/dL — ABNORMAL LOW (ref 0.44–1.00)
GFR, Estimated: >60 mL/min (ref >60)
Glucose, Bld: 87 mg/dL (ref 70–99)
Potassium: 3.7 mmol/L (ref 3.5–5.1)
Sodium: 136 mmol/L (ref 135–145)

## 2023-03-12 LAB — URINE CULTURE: Culture: >=100000 — AB

## 2023-03-12 LAB — PHOSPHORUS: Phosphorus: 2.5 mg/dL (ref 2.5–4.6)

## 2023-03-12 MED ORDER — SODIUM CHLORIDE 0.9 % IV SOLN
2.0000 g | INTRAVENOUS | Status: DC
  Filled 2023-03-12: qty 20

## 2023-03-12 MED ORDER — ORAL CARE MOUTH RINSE: 15.0000 mL | OROMUCOSAL | Status: DC | PRN

## 2023-03-12 NOTE — Plan of Care (Signed)
   Problem: Education: Goal: Knowledge of General Education information will improve Description: Including pain rating scale, medication(s)/side effects and non-pharmacologic comfort measures Outcome: Progressing   Problem: Clinical Measurements: Goal: Will remain free from infection Outcome: Progressing

## 2023-03-12 NOTE — Progress Notes (Signed)
[facility]  - [place] at [facility]   PATIENT NAME: Rebecca [name]    MR#:  [id number]  DATE OF BIRTH:  Aug 01, 1960  SUBJECTIVE:   No family at bedside. Overall doing well.    VITALS:  Blood pressure (!) 141/82, pulse 89, temperature 98.1 F (36.7 C), resp. rate 16, height 5\' 4"  (1.626 m), weight 94.7 kg, SpO2 97%.  PHYSICAL EXAMINATION:   GENERAL:  63 y.o.-year-old patient with no acute distress.  LUNGS: Normal breath sounds bilaterally, no wheezing CARDIOVASCULAR: S1, S2 normal. No murmur   ABDOMEN: Soft, nontender, nondistended. Chronic Foley EXTREMITIES: No  edema b/l.    NEUROLOGIC:  patient is alert and awake generalize deconditioning and weakness  LABORATORY PANEL:  CBC Recent Labs  Lab 03/11/23 0517  WBC 6.9  HGB 10.3*  HCT 30.1*  PLT 149*    Chemistries  Recent Labs  Lab 03/08/23 1514 03/09/23 0557 03/11/23 0517 03/12/23 0438  NA 128* 132*   < > 136  K 3.1* 3.8   < > 3.7  CL 96* 103   < > 104  CO2 19* 19*   < > 24  GLUCOSE 140* 110*   < > 87  BUN 26* 22   < > 10  CREATININE 0.69 0.59   < > 0.39*  CALCIUM 8.7* 8.6*   < > 8.4*  MG 1.8  --   --   --   AST 45* 76*  --   --   ALT 45* 61*  --   --   ALKPHOS 84 78  --   --   BILITOT 1.3* 0.8  --   --    < > = values in this interval not displayed.   Assessment and Plan  TONJIA PARILLO is a 63 y.o. female with medical history significant of MS, spasmatic paraplegia, bed-bound  neurogenic bladder with chronic Foley catheter placement, diastolic CHF, cardiac, chronic pancreatic insufficiency, lymphedema, breast cancer (s/p of right mastectomy), endocarditis, seizure, who presents with weakness, fever, chills, diarrhea.    CT abdomen revealed segments of cortical hypoenhancement in the right kidney and left kidney most consistent with multifocal focal pyelonephritis.  Patient is admitted to hospitalist service for sepsis secondary to acute pyelonephritis, started on IV hydration, IV meropenem  therapy due to history of ESBL.  Sepsis due to acute pyelonephritis in the setting of chronic Foley catheter --Presented with low-grade fever, tachycardia, WBC 15.1, Lactic acid is normal.  Procalcitonin 1.48.   --Continue IV meropenem due to history of ESBL (patient received 1 dose of Rocephin and vancomycin in ED) --Multiple issues with foley, urology consulted who advised 14 days antibiotic therapy, outpatient IR placement of suprapubic. -- Blood culture E. Coli -- urine culture providentia -- will narrow down antibiotics once cultures obtained --d/w [facility]--UC back. BC pending final results. 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 --Continue seizure precautions   MS (multiple sclerosis) with spasmatic paraplegia: --Continue home dose amantadine, baclofen, tizanidine.   Neurogenic bladder --She has chronic Foley catheter which was exchanged in ED --Continue oxybutynin   Pancreatic insufficiency --Continue Creon. --Diarrhea much improved.     Chronic diastolic CHF (congestive heart failure) (HCC): 2D echo on 03/14/2016 showed EF of 65% with grade 1 diastolic dysfunction.  Patient has chronic lymphedema in both legs,  but denies shortness of breath, no pulm edema on chest x-ray.  She is euvolemic.   Hyponatremia:  Sodium 132, improved with gentle IV hydration Encourage oral fluids, diet.   Hypokalemia and hypophosphatemia:  --Continue the KPhos supplementation.   Abnormal LFTs: Mild.  Possibly due to sepsis. Avoid hepatotoxic drugs.       She is bed bound. Continue nursing supportive care.    Family communication :none today Consults : urology CODE STATUS: full code DVT Prophylaxis : enoxaparin Level of care: Telemetry Medical Status is: Inpatient Remains inpatient appropriate  because: awaiting for blood culture and urine culture sensitivities    TOTAL TIME TAKING CARE OF THIS PATIENT: 35 minutes.  >50% time spent on counselling and coordination of care  Note: This dictation was prepared with Dragon dictation along with smaller phrase technology. Any transcriptional errors that result from this process are unintentional.  Enedina Finner M.D    [facility]   CC: Primary care physician; Danelle Berry, PA-C

## 2023-03-13 ENCOUNTER — Other Ambulatory Visit: Payer: Self-pay | Admitting: Internal Medicine

## 2023-03-13 DIAGNOSIS — N10 Acute tubulo-interstitial nephritis: Secondary | ICD-10-CM | POA: Diagnosis not present

## 2023-03-13 LAB — CULTURE, BLOOD (ROUTINE X 2)
Culture: NO GROWTH — AB
Special Requests: ADEQUATE

## 2023-03-13 LAB — PHOSPHORUS: Phosphorus: 2.6 mg/dL (ref 2.5–4.6)

## 2023-03-13 MED ORDER — LEVOFLOXACIN 750 MG PO TABS: 750.0000 mg | ORAL_TABLET | Freq: Every day | ORAL | 0 refills | Status: AC

## 2023-03-13 MED ORDER — LEVOFLOXACIN 750 MG PO TABS
750.0000 mg | ORAL_TABLET | Freq: Every day | ORAL | Status: DC
  Administered 2023-03-13: 750 mg via ORAL
  Filled 2023-03-13: qty 1

## 2023-03-13 NOTE — Plan of Care (Signed)

## 2023-03-13 NOTE — Plan of Care (Signed)

## 2023-03-13 NOTE — Progress Notes (Unsigned)
 {  Select_TRH_Note:[id number]}

## 2023-03-13 NOTE — TOC Progression Note (Signed)
Transition of Care [facility]) - Progression Note    Patient Details  Name: Rebecca [name] MRN: [id number] Date of Birth: 02-Jul-1960  Transition of Care [facility]) CM/SW Contact  Marlowe Sax, RN Phone Number: 03/13/2023, 9:28 AM  Clinical Narrative:     Called EMS to transport the patient to her home, she is already set up with [facility] for [facility] services and has DME at home       Expected Discharge Plan and Services         Expected Discharge Date: 03/13/23                                     Social Determinants of Health (SDOH) Interventions SDOH Screenings   Food Insecurity: No Food Insecurity (03/09/2023)  Housing: Low Risk  (03/09/2023)  Transportation Needs: Unmet Transportation Needs (03/09/2023)  Utilities: Not At Risk (03/09/2023)  Alcohol Screen: Low Risk  (08/17/2021)  Depression (PHQ2-9): Low Risk  (02/20/2023)  Financial Resource Strain: Low Risk  (12/03/2022)  Physical Activity: Inactive (12/03/2022)  Social Connections: Socially Isolated (03/09/2023)  Stress: No Stress Concern Present (12/03/2022)  Tobacco Use: Low Risk  (03/09/2023)  Health Literacy: Adequate Health Literacy (09/29/2022)    Readmission Risk Interventions     No data to display

## 2023-03-13 NOTE — Discharge Summary (Signed)
Physician Discharge Summary   Patient: Rebecca [name] MRN: [id number] DOB: 12-06-1960  Admit date:     03/08/2023  Discharge date: 03/13/23  Discharge Physician: Enedina Finner   PCP: Danelle Berry, PA-C   Recommendations at discharge:    F/u PCP in 1-2 weeks F/u [facility] for evaluation of Suprapubic catheter placement  Discharge Diagnoses: Principal Problem:   Acute pyelonephritis Active Problems:   Sepsis (HCC)   Seizure disorder (HCC)   MS (multiple sclerosis) (HCC)   Neurogenic bladder   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HCC)   Pancreatic insufficiency   Chronic diastolic CHF (congestive heart failure) (HCC)   Hyponatremia   Hypokalemia   Abnormal LFTs   Diarrhea   Hospital Course:  Rebecca [name] is a 63 y.o. female with medical history significant of MS, spasmatic paraplegia, bed-bound  neurogenic bladder with chronic Foley catheter placement, diastolic CHF, cardiac, chronic pancreatic insufficiency, lymphedema, breast cancer (s/p of right mastectomy), endocarditis, seizure, who presents with weakness, fever, chills, diarrhea.    CT abdomen revealed segments of cortical hypoenhancement in the right kidney and left kidney most consistent with multifocal focal pyelonephritis.  Patient is admitted to hospitalist service for sepsis secondary to acute pyelonephritis, started on IV hydration, IV meropenem therapy due to history of ESBL.   Sepsis due to acute pyelonephritis in the setting of chronic Foley catheter --Presented with low-grade fever, tachycardia, WBC 15.1, Lactic acid is normal.  Procalcitonin 1.48.   --Continue IV meropenem due to history of ESBL (patient received 1 dose of Rocephin and vancomycin in ED) --Multiple issues with foley, urology consulted who advised 14 days antibiotic therapy, outpatient IR placement of suprapubic. -- Blood culture E. Coli -- urine culture providentia -- will narrow down antibiotics once cultures obtained --d/w  RPH--will switch to po Levaquin at discharge. Pt will f/u Urology for future plans for Suprapubic catheter placement   Seizure disorder with complex partial seizure disorder (HCC) --Continue home Keppra  --As needed Ativan for seizures. --Continue seizure precautions   MS (multiple sclerosis) with spasmatic paraplegia: --Continue home dose amantadine, baclofen, tizanidine.   Neurogenic bladder --She has chronic Foley catheter which was exchanged in ED --Continue oxybutynin   Pancreatic insufficiency --Continue Creon.   Chronic diastolic CHF (congestive heart failure) (HCC): 2D echo on 03/14/2016 showed EF of 65% with grade 1 diastolic dysfunction.  Patient has chronic lymphedema in both legs, but denies shortness of breath, no pulm edema on chest x-ray.  She is euvolemic.   Hyponatremia:  Sodium 132, improved with gentle IV hydration Encourage oral fluids, diet.   Hypokalemia and hypophosphatemia:  --Continue the KPhos supplementation.   Abnormal LFTs: Mild.  Possibly due to sepsis. Avoid hepatotoxic drugs.       She is bed bound. Continue nursing supportive care. Overall stable. Discharge plan discussed. Pt is agreeable     Family communication :none today Consults : urology CODE STATUS: full code DVT Prophylaxis : enoxaparin      Diet recommendation:  Discharge Diet Orders (From admission, onward)     Start     Ordered   03/13/23 0000  Diet general        03/13/23 0832           DISCHARGE MEDICATION: Allergies as of 03/13/2023       Reactions   Fentanyl Nausea And Vomiting, Nausea Only   vomiting Was given in PACU x3 each time patient got sick   Sulfa Antibiotics Hives, Other (See Comments)  Light headed, over heated        Medication List     STOP taking these medications    Hair Skin and Nails Formula Tabs       TAKE these medications    amantadine 100 MG capsule Commonly known as: SYMMETREL TAKE 1 CAPSULE BY MOUTH THREE TIMES A  DAY   baclofen 10 MG tablet Commonly known as: LIORESAL Take 1-2 tablets (10-20 mg total) by mouth 3 (three) times daily.   COSAMIN DS PO Take 1 tablet by mouth 3 (three) times daily.   gluconic acid-citric acid irrigation Insert 30 mL into suprapubic tube and clamp tube for 10 minutes and then drain twice daily   Leg Vein & Circulation Tabs Take 1 tablet by mouth 2 (two) times daily.   levETIRAcetam 500 MG tablet Commonly known as: KEPPRA Take 1 tablet (500 mg total) by mouth 2 (two) times daily.   levofloxacin 750 MG tablet Commonly known as: LEVAQUIN Take 1 tablet (750 mg total) by mouth daily for 5 days. Start taking on: March 14, 2023   lipase/protease/amylase 29562 UNITS Cpep capsule Commonly known as: Creon Take 2 capsules (72,000 Units total) by mouth 3 (three) times daily with meals AND 1 capsule (36,000 Units total) with snacks. What changed: See the new instructions.   loratadine 10 MG tablet Commonly known as: CLARITIN Take 10 mg by mouth daily.   MULTIVITAMIN PO Take 1 tablet by mouth daily.   nystatin powder Commonly known as: MYCOSTATIN/NYSTOP Apply 1 application topically 3 (three) times daily as needed. For rash/raw skin as needed   omeprazole 40 MG capsule Commonly known as: PRILOSEC TAKE 1 CAPSULE (40 MG TOTAL) BY MOUTH DAILY.   oxybutynin 10 MG 24 hr tablet Commonly known as: DITROPAN-XL Take 1 tablet (10 mg total) by mouth 2 (two) times daily.   Rebif Rebidose 44 MCG/0.5ML Soaj Generic drug: Interferon Beta-1a INJECT 1 PEN UNDER THE SKIN 3 TIMES PER WEEK   tiZANidine 4 MG tablet Commonly known as: ZANAFLEX Take 1 tablet (4 mg total) by mouth at bedtime.   Turmeric 500 MG Caps Take 500 mg by mouth daily.        Follow-up Information     Michiel Cowboy A, PA-C. Schedule an appointment as soon as possible for a visit in 10 day(s).   Specialty: Urology Why: for evaluation of Suprpubic catheter placement Contact information: [address] [place] [address] [phone]                Discharge Exam: Ceasar Mons Weights   03/09/23 0243 03/10/23 0500 03/11/23 0500  Weight: 100.9 kg 96.9 kg 94.7 kg  GENERAL:  63 y.o.-year-old patient with no acute distress.  LUNGS: Normal breath sounds bilaterally, no wheezing CARDIOVASCULAR: S1, S2 normal. No murmur   ABDOMEN: Soft, nontender, nondistended. Chronic Foley EXTREMITIES: No  edema b/l.    NEUROLOGIC:  patient is alert and awake generalize deconditioning and weakness   Condition at discharge: fair  The results of significant diagnostics from this hospitalization (including imaging, microbiology, ancillary and laboratory) are listed below for reference.   Imaging Studies: CT ABDOMEN PELVIS W CONTRAST Result Date: 03/08/2023 CLINICAL DATA:  Abdominal pain.  Diarrhea. EXAM: CT ABDOMEN AND PELVIS WITH CONTRAST TECHNIQUE: Multidetector CT imaging of the abdomen and pelvis was performed using the standard protocol following bolus administration of intravenous contrast. RADIATION DOSE REDUCTION: This exam was performed according to the departmental dose-optimization program which includes automated exposure control,  adjustment of the mA and/or kV according to patient size and/or use of iterative reconstruction technique. CONTRAST:  OMNIPAQUE IOHEXOL 300 MG/ML  SOLN COMPARISON:  None Available. FINDINGS: Lower chest: Lung bases are clear. Hepatobiliary: No focal hepatic lesion. Postcholecystectomy. No biliary dilatation. Pancreas: Pancreas is normal. No ductal dilatation. No pancreatic inflammation. Spleen: Normal spleen Adrenals/urinary tract: Adrenal glands are normal. Kidneys enhance symmetrically. There is a hypoenhancement in the cortex of the mid LEFT kidney RIGHT kidney over a 2.5 cm segment (image 36/2) similar cortical hypoenhancement to lesser degree in the upper pole of the LEFT kidney (image 23/2) ureters are normal. No ureteral  enhancement. Foley catheter within collapsed bladder. There is mucosal enhancement within the collapsed bladder. On delayed imaging there is no filling defects within collecting systems or proximal ureters. The cortical hypoenhancement is evident on delayed imaging with 2 foci in the RIGHT kidney. Region of hypoenhancement in the LEFT kidney is more prominent on delayed phase. Stomach/Bowel: Large hiatal hernia. Stomach, duodenum small-bowel normal. Post appendectomy. Small volume of free fluid along the RIGHT pericolic gutter. No evidence of colonic inflammation. Moderate volume stool. Vascular/Lymphatic: Abdominal aorta is normal caliber with atherosclerotic calcification. There is no retroperitoneal or periportal lymphadenopathy. No pelvic lymphadenopathy. Reproductive: Uterus and adnexa unremarkable. Other: No free fluid. Musculoskeletal: No aggressive osseous lesion. IMPRESSION: 1. Segments of cortical hypoenhancement in the RIGHT kidney and LEFT kidney most consistent with multifocal focal pyelonephritis. Renal infarctions are considered but not favored. 2. No evidence of ureteritis. 3. Mucosal enhancement within the collapsed bladder suggests cystitis. Foley catheter in place. 4. No evidence of bowel inflammation. Electronically Signed   By: Genevive Bi M.D.   On: 03/08/2023 19:47   DG Chest Port 1 View Result Date: 03/08/2023 CLINICAL DATA:  Fever. EXAM: PORTABLE CHEST 1 VIEW COMPARISON:  12/14/2020 FINDINGS: Stable positioning of left chest port. The heart is normal in size, mediastinal contours are stable. Mild retrocardiac atelectasis or scarring. No confluent airspace disease. No pleural effusion or pneumothorax. IMPRESSION: Retrocardiac atelectasis or scarring. No confluent airspace disease. Electronically Signed   By: Narda Rutherford M.D.   On: 03/08/2023 16:39    Microbiology: Results for orders placed or performed during the hospital encounter of 03/08/23  C Difficile Quick Screen w PCR  reflex     Status: None   Collection Time: 03/08/23  6:20 AM   Specimen: STOOL  Result Value Ref Range Status   C Diff antigen NEGATIVE NEGATIVE Final   C Diff toxin NEGATIVE NEGATIVE Final   C Diff interpretation No C. difficile detected.  Final    Comment: Performed at [facility], [address] [place], [place] [address]  Gastrointestinal Panel by PCR , Stool     Status: None   Collection Time: 03/08/23  6:20 AM   Specimen: Stool  Result Value Ref Range Status   Campylobacter species NOT DETECTED NOT DETECTED Final   Plesimonas shigelloides NOT DETECTED NOT DETECTED Final   Salmonella species NOT DETECTED NOT DETECTED Final   Yersinia enterocolitica NOT DETECTED NOT DETECTED Final   Vibrio species NOT DETECTED NOT DETECTED Final   Vibrio cholerae NOT DETECTED NOT DETECTED Final   Enteroaggregative E coli (EAEC) NOT DETECTED NOT DETECTED Final   Enteropathogenic E coli (EPEC) NOT DETECTED NOT DETECTED Final   Enterotoxigenic E coli (ETEC) NOT DETECTED NOT DETECTED Final   Shiga like toxin producing E coli (STEC) NOT DETECTED NOT DETECTED Final   Shigella/Enteroinvasive E coli (EIEC) NOT DETECTED NOT  DETECTED Final   Cryptosporidium NOT DETECTED NOT DETECTED Final   Cyclospora cayetanensis NOT DETECTED NOT DETECTED Final   Entamoeba histolytica NOT DETECTED NOT DETECTED Final   Giardia lamblia NOT DETECTED NOT DETECTED Final   Adenovirus F40/41 NOT DETECTED NOT DETECTED Final   Astrovirus NOT DETECTED NOT DETECTED Final   Norovirus GI/GII NOT DETECTED NOT DETECTED Final   Rotavirus A NOT DETECTED NOT DETECTED Final   Sapovirus (I, II, IV, and V) NOT DETECTED NOT DETECTED Final    Comment: Performed at [facility], [address] [place], [place] [address]  Culture, blood (Routine x 2)     Status: Abnormal   Collection Time: 03/08/23  3:14 PM   Specimen: BLOOD  Result Value Ref Range Status   Specimen Description   Final    BLOOD BLOOD RIGHT  ARM Performed at [facility], [address] [place], [place] [address]    Special Requests   Final    BOTTLES DRAWN AEROBIC AND ANAEROBIC Blood Culture adequate volume Performed at [facility], [address] [place], [place] [address]    Culture  Setup Time   Final    GRAM NEGATIVE RODS ANAEROBIC BOTTLE ONLY ESCHERICHIA COLI CRITICAL RESULT CALLED TO, READ BACK BY AND VERIFIED WITH: JASON ROBBINS @ 0059 03/11/23 [facility]    Culture ESCHERICHIA COLI (A)  Final   Report Status 03/13/2023 FINAL  Final   Organism ID, Bacteria ESCHERICHIA COLI  Final   Organism ID, Bacteria ESCHERICHIA COLI  Final      Susceptibility   Escherichia coli - KIRBY BAUER*    CEFAZOLIN INTERMEDIATE Intermediate    Escherichia coli - MIC*    AMPICILLIN <=2 SENSITIVE Sensitive     CEFEPIME <=0.12 SENSITIVE Sensitive     CEFTAZIDIME <=1 SENSITIVE Sensitive     CEFTRIAXONE <=0.25 SENSITIVE Sensitive     CIPROFLOXACIN <=0.25 SENSITIVE Sensitive     GENTAMICIN <=1 SENSITIVE Sensitive     IMIPENEM <=0.25 SENSITIVE Sensitive     TRIMETH/SULFA <=20 SENSITIVE Sensitive     AMPICILLIN/SULBACTAM <=2 SENSITIVE Sensitive     PIP/TAZO <=4 SENSITIVE Sensitive ug/mL    * ESCHERICHIA COLI    ESCHERICHIA COLI  Urine Culture (for pregnant, neutropenic or urologic patients or patients with an indwelling urinary catheter)     Status: Abnormal   Collection Time: 03/08/23  3:14 PM   Specimen: Urine, Clean Catch  Result Value Ref Range Status   Specimen Description   Final    URINE, CLEAN CATCH Performed at [facility], [address] [place], [place] [address]    Special Requests   Final    NONE Performed at [facility], [address] [place], [place] [address]    Culture (A)  Final    >=100,000 COLONIES/mL PROVIDENCIA STUARTII Two isolates with different morphologies were identified as the same organism.The most resistant organism was reported. Performed at [facility], [address], [place], [place] [address]    Report Status 03/12/2023 FINAL  Final   Organism ID, Bacteria PROVIDENCIA STUARTII (A)  Final      Susceptibility   Providencia stuartii - MIC*    AMPICILLIN >=32 RESISTANT Resistant     CEFEPIME <=0.12 SENSITIVE Sensitive     CEFTRIAXONE <=0.25 SENSITIVE Sensitive     CIPROFLOXACIN <=0.25 SENSITIVE Sensitive     GENTAMICIN RESISTANT Resistant     IMIPENEM 2 SENSITIVE Sensitive     NITROFURANTOIN RESISTANT Resistant  TRIMETH/SULFA <=20 SENSITIVE Sensitive     AMPICILLIN/SULBACTAM >=32 RESISTANT Resistant     PIP/TAZO <=4 SENSITIVE Sensitive ug/mL    * >=100,000 COLONIES/mL PROVIDENCIA STUARTII  Blood Culture ID Panel (Reflexed)     Status: Abnormal   Collection Time: 03/08/23  3:14 PM  Result Value Ref Range Status   Enterococcus faecalis NOT DETECTED NOT DETECTED Final   Enterococcus Faecium NOT DETECTED NOT DETECTED Final   Listeria monocytogenes NOT DETECTED NOT DETECTED Final   Staphylococcus species NOT DETECTED NOT DETECTED Final   Staphylococcus aureus (BCID) NOT DETECTED NOT DETECTED Final   Staphylococcus epidermidis NOT DETECTED NOT DETECTED Final   Staphylococcus lugdunensis NOT DETECTED NOT DETECTED Final   Streptococcus species NOT DETECTED NOT DETECTED Final   Streptococcus agalactiae NOT DETECTED NOT DETECTED Final   Streptococcus pneumoniae NOT DETECTED NOT DETECTED Final   Streptococcus pyogenes NOT DETECTED NOT DETECTED Final   A.calcoaceticus-baumannii NOT DETECTED NOT DETECTED Final   Bacteroides fragilis NOT DETECTED NOT DETECTED Final   Enterobacterales DETECTED (A) NOT DETECTED Final    Comment: Enterobacterales represent a large order of gram negative bacteria, not a single organism. CRITICAL RESULT CALLED TO, READ BACK BY AND VERIFIED WITH: JASON ROBBINS @ 0059 03/11/23 [facility]    Enterobacter cloacae complex NOT DETECTED NOT DETECTED Final   Escherichia coli DETECTED (A) NOT DETECTED Final     Comment: CRITICAL RESULT CALLED TO, READ BACK BY AND VERIFIED WITH: JASON ROBBINS @ 0059 03/11/23 [facility]    Klebsiella aerogenes NOT DETECTED NOT DETECTED Final   Klebsiella oxytoca NOT DETECTED NOT DETECTED Final   Klebsiella pneumoniae NOT DETECTED NOT DETECTED Final   Proteus species NOT DETECTED NOT DETECTED Final   Salmonella species NOT DETECTED NOT DETECTED Final   Serratia marcescens NOT DETECTED NOT DETECTED Final   Haemophilus influenzae NOT DETECTED NOT DETECTED Final   Neisseria meningitidis NOT DETECTED NOT DETECTED Final   Pseudomonas aeruginosa NOT DETECTED NOT DETECTED Final   Stenotrophomonas maltophilia NOT DETECTED NOT DETECTED Final   Candida albicans NOT DETECTED NOT DETECTED Final   Candida auris NOT DETECTED NOT DETECTED Final   Candida glabrata NOT DETECTED NOT DETECTED Final   Candida krusei NOT DETECTED NOT DETECTED Final   Candida parapsilosis NOT DETECTED NOT DETECTED Final   Candida tropicalis NOT DETECTED NOT DETECTED Final   Cryptococcus neoformans/gattii NOT DETECTED NOT DETECTED Final   CTX-M ESBL NOT DETECTED NOT DETECTED Final   Carbapenem resistance IMP NOT DETECTED NOT DETECTED Final   Carbapenem resistance KPC NOT DETECTED NOT DETECTED Final   Carbapenem resistance NDM NOT DETECTED NOT DETECTED Final   Carbapenem resist OXA 48 LIKE NOT DETECTED NOT DETECTED Final   Carbapenem resistance VIM NOT DETECTED NOT DETECTED Final    Comment: Performed at [facility], [address] [place], [place] [address]  Resp panel by RT-PCR (RSV, Flu A&B, Covid) Anterior Nasal Swab     Status: None   Collection Time: 03/08/23  4:42 PM   Specimen: Anterior Nasal Swab  Result Value Ref Range Status   SARS Coronavirus 2 by RT PCR NEGATIVE NEGATIVE Final    Comment: (NOTE) SARS-CoV-2 target nucleic acids are NOT DETECTED.  The SARS-CoV-2 RNA is generally detectable in upper respiratory specimens during the acute phase of infection. The lowest concentration  of SARS-CoV-2 viral copies this assay can detect is 138 copies/mL. A negative result does not preclude SARS-Cov-2 infection and should not be used as the sole basis for treatment or other patient  management decisions. A negative result may occur with  improper specimen collection/handling, submission of specimen other than nasopharyngeal swab, presence of viral mutation(s) within the areas targeted by this assay, and inadequate number of viral copies(<138 copies/mL). A negative result must be combined with clinical observations, patient history, and epidemiological information. The expected result is Negative.  Fact Sheet for Patients:  BloggerCourse.com  Fact Sheet for Healthcare Providers:  SeriousBroker.it  This test is no t yet approved or cleared by the [place] FDA and  has been authorized for detection and/or diagnosis of SARS-CoV-2 by FDA under an Emergency Use Authorization (EUA). This EUA will remain  in effect (meaning this test can be used) for the duration of the COVID-19 declaration under Section 564(b)(1) of the Act, 21 U.S.C.section 360bbb-3(b)(1), unless the authorization is terminated  or revoked sooner.       Influenza A by PCR NEGATIVE NEGATIVE Final   Influenza B by PCR NEGATIVE NEGATIVE Final    Comment: (NOTE) The Xpert Xpress SARS-CoV-2/FLU/RSV plus assay is intended as an aid in the diagnosis of influenza from Nasopharyngeal swab specimens and should not be used as a sole basis for treatment. Nasal washings and aspirates are unacceptable for Xpert Xpress SARS-CoV-2/FLU/RSV testing.  Fact Sheet for Patients: BloggerCourse.com  Fact Sheet for Healthcare Providers: SeriousBroker.it  This test is not yet approved or cleared by the [place] FDA and has been authorized for detection and/or diagnosis of SARS-CoV-2 by FDA under an Emergency Use  Authorization (EUA). This EUA will remain in effect (meaning this test can be used) for the duration of the COVID-19 declaration under Section 564(b)(1) of the Act, 21 U.S.C. section 360bbb-3(b)(1), unless the authorization is terminated or revoked.     Resp Syncytial Virus by PCR NEGATIVE NEGATIVE Final    Comment: (NOTE) Fact Sheet for Patients: BloggerCourse.com  Fact Sheet for Healthcare Providers: SeriousBroker.it  This test is not yet approved or cleared by the [place] FDA and has been authorized for detection and/or diagnosis of SARS-CoV-2 by FDA under an Emergency Use Authorization (EUA). This EUA will remain in effect (meaning this test can be used) for the duration of the COVID-19 declaration under Section 564(b)(1) of the Act, 21 U.S.C. section 360bbb-3(b)(1), unless the authorization is terminated or revoked.  Performed at [facility], [address] [place], [place] [address]   Respiratory (~20 pathogens) panel by PCR     Status: None   Collection Time: 03/09/23  3:44 PM   Specimen: Nasopharyngeal Swab; Respiratory  Result Value Ref Range Status   Adenovirus NOT DETECTED NOT DETECTED Final   Coronavirus 229E NOT DETECTED NOT DETECTED Final    Comment: (NOTE) The Coronavirus on the Respiratory Panel, DOES NOT test for the novel  Coronavirus (2019 nCoV)    Coronavirus HKU1 NOT DETECTED NOT DETECTED Final   Coronavirus NL63 NOT DETECTED NOT DETECTED Final   Coronavirus OC43 NOT DETECTED NOT DETECTED Final   Metapneumovirus NOT DETECTED NOT DETECTED Final   Rhinovirus / Enterovirus NOT DETECTED NOT DETECTED Final   Influenza A NOT DETECTED NOT DETECTED Final   Influenza B NOT DETECTED NOT DETECTED Final   Parainfluenza Virus 1 NOT DETECTED NOT DETECTED Final   Parainfluenza Virus 2 NOT DETECTED NOT DETECTED Final   Parainfluenza Virus 3 NOT DETECTED NOT DETECTED Final   Parainfluenza Virus 4 NOT  DETECTED NOT DETECTED Final   Respiratory Syncytial Virus NOT DETECTED NOT DETECTED Final   Bordetella pertussis NOT DETECTED NOT DETECTED Final  Bordetella Parapertussis NOT DETECTED NOT DETECTED Final   Chlamydophila pneumoniae NOT DETECTED NOT DETECTED Final   Mycoplasma pneumoniae NOT DETECTED NOT DETECTED Final    Comment: Performed at [facility], [address] [place], [place] [address]  Culture, blood (Routine x 2)     Status: None (Preliminary result)   Collection Time: 03/09/23  4:13 PM   Specimen: BLOOD  Result Value Ref Range Status   Specimen Description BLOOD LEFT ANTECUBITAL  Final   Special Requests   Final    BOTTLES DRAWN AEROBIC AND ANAEROBIC Blood Culture results may not be optimal due to an inadequate volume of blood received in culture bottles   Culture   Final    NO GROWTH 4 DAYS Performed at [facility], [address], [place], [place] [address]    Report Status PENDING  Incomplete   *Note: Due to a large number of results and/or encounters for the requested time period, some results have not been displayed. A complete set of results can be found in Results Review.    Labs: CBC: Recent Labs  Lab 03/08/23 1514 03/09/23 0557 03/11/23 0517  WBC 15.1* 14.3* 6.9  NEUTROABS 12.8*  --   --   HGB 13.6 13.2 10.3*  HCT 39.2 39.9 30.1*  MCV 85.6 89.9 86.5  PLT 159 133* 149*   Basic Metabolic Panel: Recent Labs  Lab 03/08/23 1514 03/09/23 0557 03/11/23 0517 03/12/23 0438 03/13/23 0455  NA 128* 132* 135 136  --   K 3.1* 3.8 2.9* 3.7  --   CL 96* 103 104 104  --   CO2 19* 19* 23 24  --   GLUCOSE 140* 110* 86 87  --   BUN 26* 22 12 10   --   CREATININE 0.69 0.59 0.41* 0.39*  --   CALCIUM 8.7* 8.6* 8.0* 8.4*  --   MG 1.8  --   --   --   --   PHOS 2.1* 1.8* 2.8 2.5 2.6   Liver Function Tests: Recent Labs  Lab 03/08/23 1514 03/09/23 0557  AST 45* 76*  ALT 45* 61*  ALKPHOS 84 78  BILITOT 1.3* 0.8  PROT 7.6 7.5  ALBUMIN 3.2*  2.9*   Discharge time spent: greater than 30 minutes.  Signed: Enedina Finner, MD [facility] 03/13/2023

## 2023-03-13 NOTE — Discharge Instructions (Signed)
Foley care per instructions 

## 2023-03-14 ENCOUNTER — Telehealth: Payer: Self-pay

## 2023-03-14 LAB — CULTURE, BLOOD (ROUTINE X 2): Culture: NO GROWTH

## 2023-03-14 NOTE — Transitions of Care (Post Inpatient/ED Visit) (Signed)
 03/14/2023  Name: Rebecca [name] MRN: [id number] DOB: 09/17/1960  Today's TOC FU Call Status: Today's TOC FU Call Status:: Successful TOC FU Call Completed TOC FU Call Complete Date: 03/14/23 Patient's Name and Date of Birth confirmed.  Transition Care Management Follow-up Telephone Call Date of Discharge: 03/13/23 Discharge Facility: [facility]) Type of Discharge: Inpatient Admission Primary Inpatient Discharge Diagnosis:: Sepsis due to acute pyelonephritis in the setting of chronic Foley catheter How have you been since you were released from the hospital?: Better Any questions or concerns?: No  Items Reviewed: Did you receive and understand the discharge instructions provided?: Yes Medications obtained,verified, and reconciled?: Yes (Medications Reviewed) Any new allergies since your discharge?: No Dietary orders reviewed?: Yes Type of Diet Ordered:: Reg Heart Healthy Do you have support at home?: Yes People in Home: spouse  Medications Reviewed Today: Medications Reviewed Today     Reviewed by Willma Camelia CROME, RN (Registered Nurse) on 03/14/23 at 1213  Med List Status: <None>   Medication Order Taking? Sig Documenting Provider Last Dose Status Informant  amantadine  (SYMMETREL ) 100 MG capsule [id number] Yes TAKE 1 CAPSULE BY MOUTH THREE TIMES A DAY Penumalli, Vikram R, MD Taking Active Self  baclofen  (LIORESAL ) 10 MG tablet [id number] Yes Take 1-2 tablets (10-20 mg total) by mouth 3 (three) times daily. Margaret Eduard SAUNDERS, MD Taking Active Self  gluconic acid-citric acid (RENACIDIN ) irrigation [id number] Yes Insert 30 mL into suprapubic tube and clamp tube for 10 minutes and then drain twice daily McGowan, Clotilda LABOR, PA-C Taking Active Self  Glucosamine-Chondroitin (COSAMIN DS PO) [id number] Yes Take 1 tablet by mouth 3 (three) times daily. [provider] Taking Active Self  levETIRAcetam  (KEPPRA ) 500 MG tablet [id number] Yes Take 1 tablet (500  mg total) by mouth 2 (two) times daily. Penumalli, Vikram R, MD Taking Active Self  levofloxacin  (LEVAQUIN ) 750 MG tablet [id number] Yes Take 1 tablet (750 mg total) by mouth daily for 5 days. Tobie Calix, MD Taking Active   lipase/protease/amylase (CREON ) 36000 UNITS CPEP capsule [id number] Yes Take 2 capsules (72,000 Units total) by mouth 3 (three) times daily with meals AND 1 capsule (36,000 Units total) with snacks.  Patient taking differently: Take 1-2 capsules (36,000- 72,000 Units total) by mouth with (three) times daily with meals AND 1 capsule (36,000 Units total) with snacks. (1 capsule by mouth with breakfast, 2 capsules by mouth with lunch and 1 capsule by mouth with dinner)     [place], PA-C Taking Active Self  loratadine  (CLARITIN ) 10 MG tablet [id number] Yes Take 10 mg by mouth daily. [provider] Taking Active Self  Misc Natural Products (LEG VEIN & CIRCULATION) TABS [id number] Yes Take 1 tablet by mouth 2 (two) times daily.  [provider] Taking Active Self  Multiple Vitamins-Minerals (MULTIVITAMIN PO) [id number] Yes Take 1 tablet by mouth daily.  [provider] Taking Active Self           Med Note BEVERLEE ADE   Fri Feb 02, 2018  6:19 PM)    nystatin  (MYCOSTATIN /NYSTOP ) powder [id number] Yes Apply 1 application topically 3 (three) times daily as needed. For rash/raw skin as needed Tapia, Leisa, PA-C Taking Active Self  omeprazole  (PRILOSEC) 40 MG capsule [id number] Yes TAKE 1 CAPSULE (40 MG TOTAL) BY MOUTH DAILY. Leavy Mole, PA-C Taking Active Self  oxybutynin  (DITROPAN -XL) 10 MG 24 hr tablet [id number] Yes Take 1 tablet (10 mg total) by mouth 2 (two) times daily. Helon Clotilda LABOR, PA-C Taking  Active Self  REBIF  REBIDOSE 44 MCG/0.5ML SOAJ [id number] Yes INJECT 1 PEN UNDER THE SKIN 3 TIMES PER WEEK Penumalli, Eduard SAUNDERS, MD Taking Active Self           Med Note (Jamacia Jester L   Tue Mar 14, 2023 12:13 PM) Mon-Wed and Fri, if missed she adjusts days  to get back on schedule   tiZANidine  (ZANAFLEX ) 4 MG tablet [id number] Yes Take 1 tablet (4 mg total) by mouth at bedtime. Penumalli, Vikram R, MD Taking Active Self  Turmeric 500 MG CAPS [id number] Yes Take 500 mg by mouth daily. [provider] Taking Active Self          Medication reconciliation / review completed based on most recent discharge summary and EHR medication list. Confirmed patient is taking all newly prescribed medications as instructed (any discrepancies are noted in review section)   Patient / Caregiver is aware of any changes to and / or  any dosage adjustments to medication regimen. Patient/ Caregiver denies questions at this time and reports no barriers to medication adherence.   Home Care and Equipment/Supplies: Were Home Health Services Ordered?: Yes Name of Home Health Agency:: [facility] Has Agency set up a time to come to your home?: Yes First Home Health Visit Date: 03/14/23 Any new equipment or medical supplies ordered?: No  Functional Questionnaire: Do you need assistance with bathing/showering or dressing?: Yes Do you need assistance with meal preparation?: Yes Do you need assistance with eating?: No Do you have difficulty maintaining continence: Yes Do you need assistance with getting out of bed/getting out of a chair/moving?: Yes Do you have difficulty managing or taking your medications?: Yes  Follow up appointments reviewed: PCP Follow-up appointment confirmed?: No MD Provider Line Number:[phone] Given: No (She will schedule she nees to arrange transportation) Specialist Hospital Follow-up appointment confirmed?: Yes Do you need transportation to your follow-up appointment?: Yes Transportation Need Intervention Addressed By:: Other: Do you understand care options if your condition(s) worsen?: Yes-patient verbalized understanding  SDOH Interventions Today    Flowsheet Row Most Recent Value  SDOH Interventions   Food Insecurity  Interventions Intervention Not Indicated  Housing Interventions Intervention Not Indicated  Transportation Interventions Intervention Not Indicated, Patient Resources (Friends/Family), Payor Benefit, Other (Comment)  [facility] transport and is familiar with benefit amd scheduling]  Utilities Interventions Intervention Not Indicated  Social Connections Interventions Intervention Not Indicated, Patient Declined  [sees sister frequently and speaks to her daily. Denies lonliness or need for additlional social contact]      Interventions Today    Flowsheet Row Most Recent Value  Chronic Disease   Chronic disease during today's visit Other  [MS]  General Interventions   General Interventions Discussed/Reviewed General Interventions Discussed, General Interventions Reviewed, Doctor Visits  Doctor Visits Discussed/Reviewed Doctor Visits Reviewed, PCP, Specialist  PCP/Specialist Visits Compliance with follow-up visit  Exercise Interventions   Exercise Discussed/Reviewed Exercise Discussed, Exercise Reviewed  Mental Health Interventions   Mental Health Discussed/Reviewed Coping Strategies  Nutrition Interventions   Nutrition Discussed/Reviewed Nutrition Reviewed  Pharmacy Interventions   Pharmacy Dicussed/Reviewed Medications and their functions  Safety Interventions   Safety Discussed/Reviewed Safety Reviewed, Fall Risk, Home Safety  Home Safety Assistive Devices        Benefits reviewed  Based on current information and Insurance plan -Reviewed benefits accessible to patient, including details about eligibility options for care and  available value based care options  if any areas of needs were identified.  Reviewed patient/  caregiver's ability  to access and / or  ability with navigating the benefits system..Amb Referral made if indicted , refer to orders section of note for details   Reviewed goals for care Patient/ Caregiver  verbalizes understanding of instructions and care  plan provided. Patient / Caregiver was encouraged to make informed decisions about their care, actively participate in managing their health condition, and implement lifestyle changes as needed to promote independence and self-management of health care. There were no reported  barriers to care.   TOC program  Patient is at high risk for readmission and/or has history of  high utilization  Discussed [facility]I  TOC program and weekly calls to patient to assess condition/status, medication management  and provide support/education as indicated . Patient/ Caregiver voiced understanding and declined enrollment in the 30-day TOC Program.   She has a medical history significant of MS, spasmatic paraplegia, bed-bound  neurogenic bladder with chronic Foley catheter placement, diastolic CHF, cardiac, chronic pancreatic insufficiency, lymphedema, breast cancer (s/p of right mastectomy), endocarditis, seizure, who presented to ER  with weakness, fever, chills, diarrhea.  She is on ABT for UTI. She has indwelling catheter managed by [facility] and is a long standing patient. She is anticipating a S/P catheter placement after her UTI clears . She is well versed  on her medical care and medications. She self manages without difficulty. She does have a lift chair at home and prefers to sleep in the recliner. She has a pvt aide 6 days a week to assist with ADL's ( [employer]) and strong family support between her spouse Todd and sister Olam . She utilizes [facility] meals card and is familiar with transport benefits and self arranges  She is scheduling PCP and Urology follow-ups She has port flush scheduled for 2/7  The patient has been provided with contact information for the care management team and has been advised to call with any health-related questions or concerns. Follow up as indicated with Care Team , or sooner should any new problems arise.    Bari Mayans , BSN, RN [facility]   VBCI-Population Health RN Care  Manager Direct Dial [phone]  Fax: [phone] Website: [email or web address]

## 2023-03-15 DIAGNOSIS — I10 Essential (primary) hypertension: Secondary | ICD-10-CM | POA: Diagnosis not present

## 2023-03-15 DIAGNOSIS — K592 Neurogenic bowel, not elsewhere classified: Secondary | ICD-10-CM | POA: Diagnosis not present

## 2023-03-15 DIAGNOSIS — K219 Gastro-esophageal reflux disease without esophagitis: Secondary | ICD-10-CM | POA: Diagnosis not present

## 2023-03-15 DIAGNOSIS — Z853 Personal history of malignant neoplasm of breast: Secondary | ICD-10-CM | POA: Diagnosis not present

## 2023-03-15 DIAGNOSIS — N319 Neuromuscular dysfunction of bladder, unspecified: Secondary | ICD-10-CM | POA: Diagnosis not present

## 2023-03-15 DIAGNOSIS — D649 Anemia, unspecified: Secondary | ICD-10-CM | POA: Diagnosis not present

## 2023-03-15 DIAGNOSIS — Z466 Encounter for fitting and adjustment of urinary device: Secondary | ICD-10-CM | POA: Diagnosis not present

## 2023-03-15 DIAGNOSIS — K222 Esophageal obstruction: Secondary | ICD-10-CM | POA: Diagnosis not present

## 2023-03-15 DIAGNOSIS — G35 Multiple sclerosis: Secondary | ICD-10-CM | POA: Diagnosis not present

## 2023-03-15 DIAGNOSIS — I890 Lymphedema, not elsewhere classified: Secondary | ICD-10-CM | POA: Diagnosis not present

## 2023-03-15 DIAGNOSIS — R1310 Dysphagia, unspecified: Secondary | ICD-10-CM | POA: Diagnosis not present

## 2023-03-15 DIAGNOSIS — K589 Irritable bowel syndrome without diarrhea: Secondary | ICD-10-CM | POA: Diagnosis not present

## 2023-03-15 DIAGNOSIS — H6123 Impacted cerumen, bilateral: Secondary | ICD-10-CM | POA: Diagnosis not present

## 2023-03-15 DIAGNOSIS — J309 Allergic rhinitis, unspecified: Secondary | ICD-10-CM | POA: Diagnosis not present

## 2023-03-15 DIAGNOSIS — H900 Conductive hearing loss, bilateral: Secondary | ICD-10-CM | POA: Diagnosis not present

## 2023-03-15 DIAGNOSIS — Z981 Arthrodesis status: Secondary | ICD-10-CM | POA: Diagnosis not present

## 2023-03-15 DIAGNOSIS — D696 Thrombocytopenia, unspecified: Secondary | ICD-10-CM | POA: Diagnosis not present

## 2023-03-15 DIAGNOSIS — G801 Spastic diplegic cerebral palsy: Secondary | ICD-10-CM | POA: Diagnosis not present

## 2023-03-15 DIAGNOSIS — E119 Type 2 diabetes mellitus without complications: Secondary | ICD-10-CM | POA: Diagnosis not present

## 2023-03-15 DIAGNOSIS — K8689 Other specified diseases of pancreas: Secondary | ICD-10-CM | POA: Diagnosis not present

## 2023-03-15 DIAGNOSIS — M542 Cervicalgia: Secondary | ICD-10-CM | POA: Diagnosis not present

## 2023-03-15 DIAGNOSIS — G894 Chronic pain syndrome: Secondary | ICD-10-CM | POA: Diagnosis not present

## 2023-03-15 DIAGNOSIS — D709 Neutropenia, unspecified: Secondary | ICD-10-CM | POA: Diagnosis not present

## 2023-03-16 ENCOUNTER — Other Ambulatory Visit: Payer: Self-pay

## 2023-03-16 DIAGNOSIS — H900 Conductive hearing loss, bilateral: Secondary | ICD-10-CM | POA: Diagnosis not present

## 2023-03-16 DIAGNOSIS — H6123 Impacted cerumen, bilateral: Secondary | ICD-10-CM | POA: Diagnosis not present

## 2023-03-16 DIAGNOSIS — K222 Esophageal obstruction: Secondary | ICD-10-CM | POA: Diagnosis not present

## 2023-03-16 DIAGNOSIS — I890 Lymphedema, not elsewhere classified: Secondary | ICD-10-CM | POA: Diagnosis not present

## 2023-03-16 DIAGNOSIS — R1310 Dysphagia, unspecified: Secondary | ICD-10-CM | POA: Diagnosis not present

## 2023-03-16 DIAGNOSIS — Z853 Personal history of malignant neoplasm of breast: Secondary | ICD-10-CM | POA: Diagnosis not present

## 2023-03-16 DIAGNOSIS — G801 Spastic diplegic cerebral palsy: Secondary | ICD-10-CM | POA: Diagnosis not present

## 2023-03-16 DIAGNOSIS — Z466 Encounter for fitting and adjustment of urinary device: Secondary | ICD-10-CM | POA: Diagnosis not present

## 2023-03-16 DIAGNOSIS — E119 Type 2 diabetes mellitus without complications: Secondary | ICD-10-CM | POA: Diagnosis not present

## 2023-03-16 DIAGNOSIS — G894 Chronic pain syndrome: Secondary | ICD-10-CM | POA: Diagnosis not present

## 2023-03-16 DIAGNOSIS — M542 Cervicalgia: Secondary | ICD-10-CM | POA: Diagnosis not present

## 2023-03-16 DIAGNOSIS — K589 Irritable bowel syndrome without diarrhea: Secondary | ICD-10-CM | POA: Diagnosis not present

## 2023-03-16 DIAGNOSIS — K219 Gastro-esophageal reflux disease without esophagitis: Secondary | ICD-10-CM | POA: Diagnosis not present

## 2023-03-16 DIAGNOSIS — I10 Essential (primary) hypertension: Secondary | ICD-10-CM | POA: Diagnosis not present

## 2023-03-16 DIAGNOSIS — G35 Multiple sclerosis: Secondary | ICD-10-CM | POA: Diagnosis not present

## 2023-03-16 DIAGNOSIS — K592 Neurogenic bowel, not elsewhere classified: Secondary | ICD-10-CM | POA: Diagnosis not present

## 2023-03-16 DIAGNOSIS — K8689 Other specified diseases of pancreas: Secondary | ICD-10-CM | POA: Diagnosis not present

## 2023-03-16 DIAGNOSIS — D709 Neutropenia, unspecified: Secondary | ICD-10-CM | POA: Diagnosis not present

## 2023-03-16 DIAGNOSIS — N319 Neuromuscular dysfunction of bladder, unspecified: Secondary | ICD-10-CM | POA: Diagnosis not present

## 2023-03-16 DIAGNOSIS — J309 Allergic rhinitis, unspecified: Secondary | ICD-10-CM | POA: Diagnosis not present

## 2023-03-16 DIAGNOSIS — Z981 Arthrodesis status: Secondary | ICD-10-CM | POA: Diagnosis not present

## 2023-03-16 DIAGNOSIS — D649 Anemia, unspecified: Secondary | ICD-10-CM | POA: Diagnosis not present

## 2023-03-16 DIAGNOSIS — D696 Thrombocytopenia, unspecified: Secondary | ICD-10-CM | POA: Diagnosis not present

## 2023-03-16 NOTE — Progress Notes (Signed)
 Patient has given info about SPT placement in IR. Patient will arranged for non emergent transport on 03/23/2023 arrive at 1030am and procedure will start at 1130. Patient to be NPO after Midnight. Patient verbalized understanding.

## 2023-03-17 ENCOUNTER — Inpatient Hospital Stay: Payer: 59

## 2023-03-17 DIAGNOSIS — L03116 Cellulitis of left lower limb: Secondary | ICD-10-CM | POA: Diagnosis not present

## 2023-03-17 DIAGNOSIS — R262 Difficulty in walking, not elsewhere classified: Secondary | ICD-10-CM | POA: Diagnosis not present

## 2023-03-17 DIAGNOSIS — M6281 Muscle weakness (generalized): Secondary | ICD-10-CM | POA: Diagnosis not present

## 2023-03-20 ENCOUNTER — Other Ambulatory Visit: Payer: Self-pay | Admitting: Radiology

## 2023-03-20 DIAGNOSIS — D696 Thrombocytopenia, unspecified: Secondary | ICD-10-CM | POA: Diagnosis not present

## 2023-03-20 DIAGNOSIS — K219 Gastro-esophageal reflux disease without esophagitis: Secondary | ICD-10-CM | POA: Diagnosis not present

## 2023-03-20 DIAGNOSIS — H6123 Impacted cerumen, bilateral: Secondary | ICD-10-CM | POA: Diagnosis not present

## 2023-03-20 DIAGNOSIS — K592 Neurogenic bowel, not elsewhere classified: Secondary | ICD-10-CM | POA: Diagnosis not present

## 2023-03-20 DIAGNOSIS — I890 Lymphedema, not elsewhere classified: Secondary | ICD-10-CM | POA: Diagnosis not present

## 2023-03-20 DIAGNOSIS — N319 Neuromuscular dysfunction of bladder, unspecified: Secondary | ICD-10-CM

## 2023-03-20 DIAGNOSIS — H900 Conductive hearing loss, bilateral: Secondary | ICD-10-CM | POA: Diagnosis not present

## 2023-03-20 DIAGNOSIS — Z981 Arthrodesis status: Secondary | ICD-10-CM | POA: Diagnosis not present

## 2023-03-20 DIAGNOSIS — J309 Allergic rhinitis, unspecified: Secondary | ICD-10-CM | POA: Diagnosis not present

## 2023-03-20 DIAGNOSIS — G35 Multiple sclerosis: Secondary | ICD-10-CM | POA: Diagnosis not present

## 2023-03-20 DIAGNOSIS — G801 Spastic diplegic cerebral palsy: Secondary | ICD-10-CM | POA: Diagnosis not present

## 2023-03-20 DIAGNOSIS — D649 Anemia, unspecified: Secondary | ICD-10-CM | POA: Diagnosis not present

## 2023-03-20 DIAGNOSIS — K222 Esophageal obstruction: Secondary | ICD-10-CM | POA: Diagnosis not present

## 2023-03-20 DIAGNOSIS — K589 Irritable bowel syndrome without diarrhea: Secondary | ICD-10-CM | POA: Diagnosis not present

## 2023-03-20 DIAGNOSIS — R1310 Dysphagia, unspecified: Secondary | ICD-10-CM | POA: Diagnosis not present

## 2023-03-20 DIAGNOSIS — M542 Cervicalgia: Secondary | ICD-10-CM | POA: Diagnosis not present

## 2023-03-20 DIAGNOSIS — E119 Type 2 diabetes mellitus without complications: Secondary | ICD-10-CM | POA: Diagnosis not present

## 2023-03-20 DIAGNOSIS — G894 Chronic pain syndrome: Secondary | ICD-10-CM | POA: Diagnosis not present

## 2023-03-20 DIAGNOSIS — I10 Essential (primary) hypertension: Secondary | ICD-10-CM | POA: Diagnosis not present

## 2023-03-20 DIAGNOSIS — D709 Neutropenia, unspecified: Secondary | ICD-10-CM | POA: Diagnosis not present

## 2023-03-20 DIAGNOSIS — K8689 Other specified diseases of pancreas: Secondary | ICD-10-CM | POA: Diagnosis not present

## 2023-03-20 DIAGNOSIS — Z853 Personal history of malignant neoplasm of breast: Secondary | ICD-10-CM | POA: Diagnosis not present

## 2023-03-20 DIAGNOSIS — Z466 Encounter for fitting and adjustment of urinary device: Secondary | ICD-10-CM | POA: Diagnosis not present

## 2023-03-21 ENCOUNTER — Other Ambulatory Visit: Payer: Self-pay | Admitting: Radiology

## 2023-03-22 ENCOUNTER — Inpatient Hospital Stay: Payer: 59 | Attending: Oncology

## 2023-03-22 DIAGNOSIS — Z95828 Presence of other vascular implants and grafts: Secondary | ICD-10-CM

## 2023-03-22 DIAGNOSIS — Z853 Personal history of malignant neoplasm of breast: Secondary | ICD-10-CM | POA: Insufficient documentation

## 2023-03-22 DIAGNOSIS — C50911 Malignant neoplasm of unspecified site of right female breast: Secondary | ICD-10-CM

## 2023-03-22 DIAGNOSIS — R279 Unspecified lack of coordination: Secondary | ICD-10-CM | POA: Diagnosis not present

## 2023-03-22 DIAGNOSIS — Z452 Encounter for adjustment and management of vascular access device: Secondary | ICD-10-CM | POA: Insufficient documentation

## 2023-03-22 DIAGNOSIS — Z743 Need for continuous supervision: Secondary | ICD-10-CM | POA: Diagnosis not present

## 2023-03-22 DIAGNOSIS — R531 Weakness: Secondary | ICD-10-CM | POA: Diagnosis not present

## 2023-03-22 LAB — CBC WITH DIFFERENTIAL (CANCER CENTER ONLY)
Abs Immature Granulocytes: 0.02 10*3/uL (ref 0.00–0.07)
Basophils Absolute: 0 10*3/uL (ref 0.0–0.1)
Basophils Relative: 1 %
Eosinophils Absolute: 0.1 10*3/uL (ref 0.0–0.5)
Eosinophils Relative: 2 %
HCT: 35.5 % — ABNORMAL LOW (ref 36.0–46.0)
Hemoglobin: 11.4 g/dL — ABNORMAL LOW (ref 12.0–15.0)
Immature Granulocytes: 1 %
Lymphocytes Relative: 36 %
Lymphs Abs: 1.6 10*3/uL (ref 0.7–4.0)
MCH: 28.9 pg (ref 26.0–34.0)
MCHC: 32.1 g/dL (ref 30.0–36.0)
MCV: 89.9 fL (ref 80.0–100.0)
Monocytes Absolute: 0.2 10*3/uL (ref 0.1–1.0)
Monocytes Relative: 5 %
Neutro Abs: 2.4 10*3/uL (ref 1.7–7.7)
Neutrophils Relative %: 55 %
Platelet Count: 224 10*3/uL (ref 150–400)
RBC: 3.95 MIL/uL (ref 3.87–5.11)
RDW: 13.9 % (ref 11.5–15.5)
WBC Count: 4.4 10*3/uL (ref 4.0–10.5)
nRBC: 0 % (ref 0.0–0.2)

## 2023-03-22 MED ORDER — SODIUM CHLORIDE 0.9% FLUSH
10.0000 mL | Freq: Once | INTRAVENOUS | Status: AC
  Administered 2023-03-22: 10 mL via INTRAVENOUS
  Filled 2023-03-22: qty 10

## 2023-03-22 MED ORDER — HEPARIN SOD (PORK) LOCK FLUSH 100 UNIT/ML IV SOLN
500.0000 [IU] | Freq: Once | INTRAVENOUS | Status: AC
  Administered 2023-03-22: 500 [IU] via INTRAVENOUS
  Filled 2023-03-22: qty 5

## 2023-03-22 NOTE — Progress Notes (Signed)
Patient for IR SPT Placement on Thurs 03/23/23, I called and spoke with the patient on the phone and gave pre-procedure instructions. Pt was made aware to be here at 10:30a, NPO after MN prior to procedure as well as driver post procedure/recovery/discharge. Pt stated understanding.  Called 03/22/23  Pt is coming non-emergent EMS

## 2023-03-22 NOTE — H&P (Signed)
Chief Complaint: Patient was seen in consultation today for neurogenic bladder   Procedure: Suprapubic catheter placement  Referring Physician(s): Vaillancourt,Samantha  Supervising Physician: Ruel Favors  Patient Status: [facility] - Out-pt  History of Present Illness: Rebecca [name] is a 63 y.o. female with a history of seizures, IBS, GERD, breast cancer s/p chemo, and multiple sclerosis presenting d/t neurogenic bladder with chronic foley. Patient was recently discharged from [facility] on 03/13/23 for sepsis d/t multifocal bilateral pyelonephritis. She has been following with urology for neurogenic bladder for many years. Previously not agreeable to suprapubic catheter placement d/t concerns with transportation issues. However, following recent admission and discussions with Urologist, patient would like to move forward with SPT placement. Referred to IR as an outpatient for SPT placement.  Currently resting in bed with spouse at the bedside. Patient requests to not receive fentanyl if possible as she has had multiple negative reactions before including vomiting. Per chart patient has received Dilaudid in the past and does not recall having a negative reaction, nor is one documented. Patient is otherwise without concern. She has been NPO since midnight and is not taking any blood thinners. BP 170/94. INR pending.   Code Status: Full Code  Past Medical History:  Diagnosis Date   Allergy    Aortic valve disease    Mild AS / AI - most recent Echo demonstrated tricuspid aortic valve.   Bacterial endocarditis    History of .   Bilateral impacted cerumen 06/29/2021   Bilateral lower extremity edema    Noncardiac.  Chronic. LE Venous dopplers - negative for DVT.; Echocardiogram January 2016: Normal EF with normal wall motion and valve function. Only grade 1 diastolic dysfunction. EF 60-65%. Mild MR   Breast cancer (HCC) 12/31/2013   Right breast, 12:00, 1.5 cm, T1c,N0 invasive mammary  carcinoma, triple negative. --> Rx with Chemo   Cervical stenosis of spine    GERD (gastroesophageal reflux disease) April or May 2022   Heart murmur 2013   Herpes zoster    IBS (irritable bowel syndrome)    Lymphedema    has legs wrapped at [facility]   Multiple sclerosis (HCC) 2001   Walks from room to room @ home; but Wheelchair when going out.   Neuromuscular disorder (HCC)    MS   PONV (postoperative nausea and vomiting)    Related to Fentanyl   Seizures (HCC)    Takes Keppra   SOB (shortness of breath) 03/01/2013   Syncope and collapse    Urinary tract infection associated with indwelling urethral catheter (HCC) 02/17/2020    Past Surgical History:  Procedure Laterality Date   ANKLE SURGERY     Left   ANKLE SURGERY     ANTERIOR CERVICAL DECOMP/DISCECTOMY FUSION  11/17/2011   Procedure: ANTERIOR CERVICAL DECOMPRESSION/DISCECTOMY FUSION 2 LEVELS;  Surgeon: Maeola Harman, MD;  Location: [facility];  Service: Neurosurgery;  Laterality: N/A;  Cervical Five-Six Six-Seven Anterior cervical decompression/diskectomy/fusion   BREAST BIOPSY Right 12/31/2013   invasive mammary   BREAST SURGERY Right 02/03/2014   Right simple mastectomy with sentinel node biopsy.   CHOLECYSTECTOMY     COLONOSCOPY  2014   ESOPHAGOGASTRODUODENOSCOPY (EGD) WITH PROPOFOL N/A 07/16/2020   Procedure: ESOPHAGOGASTRODUODENOSCOPY (EGD) WITH PROPOFOL;  Surgeon: Pasty Spillers, MD;  Location: [facility] ENDOSCOPY;  Service: Endoscopy;  Laterality: N/A;  Patient has MS and will need assistance   HYSTEROSCOPY WITH D & C N/A 11/27/2018   Procedure: DILATATION AND CURETTAGE /HYSTEROSCOPY;  Surgeon: Nadara Mustard,  MD;  Location: [facility] ORS;  Service: Gynecology;  Laterality: N/A;   Lower extremity venous Dopplers  02/27/2013   No LE DVT   MASTECTOMY Right 2015   ORIF TIBIA PLATEAU Left 12/09/2020   Procedure: LEFT OPEN REDUCTION INTERNAL FIXATION (ORIF) TIBIAL PLATEAU;  Surgeon: Roby Lofts, MD;  Location: [facility] OR;   Service: Orthopedics;  Laterality: Left;   Port a cath insertion Right 01/19/2010   PORT-A-CATH REMOVAL     right   PORT-A-CATH REMOVAL Right 09/03/2013   Procedure: REMOVAL PORT-A-CATH;  Surgeon: Fransisco Hertz, MD;  Location: [facility] OR;  Service: Vascular;  Laterality: Right;   SPINE SURGERY  Cervical steinois Dr. Venetia Maxon 2014 I think?   TONSILLECTOMY     TRANSTHORACIC ECHOCARDIOGRAM  03/2013; 02/2014   a) Normal LV size and function with EF 60-65%.; Cannot exclude bicuspid aortic valve with mild AS and mild AI.; b) Normal EF with normal wall motion and valve function x Mild MR. G2 DD. EF 60-65%. Tricuspid AoV   UPPER GI ENDOSCOPY  2014    Allergies: Fentanyl and Sulfa antibiotics  Medications: Prior to Admission medications   Medication Sig Start Date End Date Taking? Authorizing Provider  amantadine (SYMMETREL) 100 MG capsule TAKE 1 CAPSULE BY MOUTH THREE TIMES A DAY 10/31/22   Penumalli, Glenford Bayley, MD  baclofen (LIORESAL) 10 MG tablet Take 1-2 tablets (10-20 mg total) by mouth 3 (three) times daily. 08/15/22   Penumalli, Glenford Bayley, MD  gluconic acid-citric acid (RENACIDIN) irrigation Insert 30 mL into suprapubic tube and clamp tube for 10 minutes and then drain twice daily 09/09/22   McGowan, Carollee Herter A, PA-C  Glucosamine-Chondroitin (COSAMIN DS PO) Take 1 tablet by mouth 3 (three) times daily.    [provider]  levETIRAcetam (KEPPRA) 500 MG tablet Take 1 tablet (500 mg total) by mouth 2 (two) times daily. 08/15/22   Penumalli, Glenford Bayley, MD  lipase/protease/amylase (CREON) 36000 UNITS CPEP capsule Take 2 capsules (72,000 Units total) by mouth 3 (three) times daily with meals AND 1 capsule (36,000 Units total) with snacks. Patient taking differently: Take 1-2 capsules (36,000- 72,000 Units total) by mouth with (three) times daily with meals AND 1 capsule (36,000 Units total) with snacks. (1 capsule by mouth with breakfast, 2 capsules by mouth with lunch and 1 capsule by mouth with dinner)    08/25/22 08/20/23  Celso Amy, PA-C  loratadine (CLARITIN) 10 MG tablet Take 10 mg by mouth daily.    [provider]  Misc Natural Products (LEG VEIN & CIRCULATION) TABS Take 1 tablet by mouth 2 (two) times daily.     [provider]  Multiple Vitamins-Minerals (MULTIVITAMIN PO) Take 1 tablet by mouth daily.     [provider]  nystatin (MYCOSTATIN/NYSTOP) powder Apply 1 application topically 3 (three) times daily as needed. For rash/raw skin as needed 07/31/19   Danelle Berry, PA-C  omeprazole (PRILOSEC) 40 MG capsule TAKE 1 CAPSULE (40 MG TOTAL) BY MOUTH DAILY. 01/16/23   Danelle Berry, PA-C  oxybutynin (DITROPAN-XL) 10 MG 24 hr tablet Take 1 tablet (10 mg total) by mouth 2 (two) times daily. 01/16/23   McGowan, Carollee Herter A, PA-C  REBIF REBIDOSE 44 MCG/0.5ML SOAJ INJECT 1 PEN UNDER THE SKIN 3 TIMES PER WEEK 10/06/22   Penumalli, Glenford Bayley, MD  tiZANidine (ZANAFLEX) 4 MG tablet Take 1 tablet (4 mg total) by mouth at bedtime. 08/15/22   Penumalli, Glenford Bayley, MD  Turmeric 500 MG CAPS Take 500 mg by mouth  daily.    [provider]     Family History  Problem Relation Age of Onset   Cancer Father        skin   Heart disease Father    Heart attack Father        heart attack in his 58's   Thyroid disease Sister    Ovarian cancer Cousin    Breast cancer Maternal Aunt 54   Breast cancer Maternal Grandmother 16   Bladder Cancer Neg Hx    Kidney cancer Neg Hx     Social History   Socioeconomic History   Marital status: Married    Spouse name: don   Number of children: 0   Years of education: 12   Highest education level: 12th grade  Occupational History   Occupation: disability  Tobacco Use   Smoking status: Never   Smokeless tobacco: Never   Tobacco comments:    Never smoked. My mom smoked. Passed 04/18/20. Dad smoked, quit 1973. Sister smokes.  Vaping Use   Vaping status: Never Used  Substance and Sexual Activity   Alcohol use: No   Drug use: No    Sexual activity: Not Currently    Partners: Male    Birth control/protection: None  Other Topics Concern   Not on file  Social History Narrative   She is married. Recently moved back to [place] after being in [place] for some time. She is accompanied by her husband and aunt.Never smoked. Never used alcohol.      1/29: husband currently in SNF in [place]. Aunt lives with her but unable to help care for her. Home health comes in once daily to change her. She does not get out of her recliner per patient   Social Drivers of Health   Financial Resource Strain: Low Risk  (12/03/2022)   Overall Financial Resource Strain (CARDIA)    Difficulty of Paying Living Expenses: Not hard at all  Food Insecurity: No Food Insecurity (03/14/2023)   Hunger Vital Sign    Worried About Running Out of Food in the Last Year: Never true    Ran Out of Food in the Last Year: Never true  Transportation Needs: Unmet Transportation Needs (03/14/2023)   PRAPARE - Transportation    Lack of Transportation (Medical): Yes    Lack of Transportation (Non-Medical): Yes  Physical Activity: Inactive (12/03/2022)   Exercise Vital Sign    Days of Exercise per Week: 0 days    Minutes of Exercise per Session: 30 min  Stress: No Stress Concern Present (12/03/2022)   [employer] of Occupational Health - Occupational Stress Questionnaire    Feeling of Stress : Not at all  Social Connections: Moderately Isolated (03/14/2023)   Social Connection and Isolation Panel [NHANES]    Frequency of Communication with Friends and Family: More than three times a week    Frequency of Social Gatherings with Friends and Family: More than three times a week    Attends Religious Services: Never    Database administrator or Organizations: No    Attends Banker Meetings: Never    Marital Status: Married    Review of Systems Denies any N/V, chest pain, shortness of breath, fevers/chills. All other ROS  negative.  Vital Signs: BP (!) 170/94   Pulse 79   Temp 98.3 F (36.8 C) (Oral)   Resp 15   Ht 5\' 4"  (1.626 m)   SpO2 95%   BMI 35.84 kg/m  Physical Exam Vitals reviewed.  Constitutional:      Appearance: Normal appearance.  HENT:     Head: Normocephalic and atraumatic.     Mouth/Throat:     Mouth: Mucous membranes are moist.     Pharynx: Oropharynx is clear.  Cardiovascular:     Rate and Rhythm: Normal rate and regular rhythm.     Heart sounds: Normal heart sounds.  Pulmonary:     Effort: Pulmonary effort is normal.     Breath sounds: Normal breath sounds.  Abdominal:     General: Abdomen is flat.     Palpations: Abdomen is soft.     Tenderness: There is no abdominal tenderness.  Musculoskeletal:        General: Normal range of motion.     Cervical back: Normal range of motion.  Skin:    General: Skin is warm and dry.  Neurological:     General: No focal deficit present.     Mental Status: She is alert and oriented to person, place, and time. Mental status is at baseline.  Psychiatric:        Mood and Affect: Mood normal.        Behavior: Behavior normal.        Judgment: Judgment normal.     Imaging: CT ABDOMEN PELVIS W CONTRAST Result Date: 03/08/2023 CLINICAL DATA:  Abdominal pain.  Diarrhea. EXAM: CT ABDOMEN AND PELVIS WITH CONTRAST TECHNIQUE: Multidetector CT imaging of the abdomen and pelvis was performed using the standard protocol following bolus administration of intravenous contrast. RADIATION DOSE REDUCTION: This exam was performed according to the departmental dose-optimization program which includes automated exposure control, adjustment of the mA and/or kV according to patient size and/or use of iterative reconstruction technique. CONTRAST:  OMNIPAQUE IOHEXOL 300 MG/ML  SOLN COMPARISON:  None Available. FINDINGS: Lower chest: Lung bases are clear. Hepatobiliary: No focal hepatic lesion. Postcholecystectomy. No biliary dilatation. Pancreas:  Pancreas is normal. No ductal dilatation. No pancreatic inflammation. Spleen: Normal spleen Adrenals/urinary tract: Adrenal glands are normal. Kidneys enhance symmetrically. There is a hypoenhancement in the cortex of the mid LEFT kidney RIGHT kidney over a 2.5 cm segment (image 36/2) similar cortical hypoenhancement to lesser degree in the upper pole of the LEFT kidney (image 23/2) ureters are normal. No ureteral enhancement. Foley catheter within collapsed bladder. There is mucosal enhancement within the collapsed bladder. On delayed imaging there is no filling defects within collecting systems or proximal ureters. The cortical hypoenhancement is evident on delayed imaging with 2 foci in the RIGHT kidney. Region of hypoenhancement in the LEFT kidney is more prominent on delayed phase. Stomach/Bowel: Large hiatal hernia. Stomach, duodenum small-bowel normal. Post appendectomy. Small volume of free fluid along the RIGHT pericolic gutter. No evidence of colonic inflammation. Moderate volume stool. Vascular/Lymphatic: Abdominal aorta is normal caliber with atherosclerotic calcification. There is no retroperitoneal or periportal lymphadenopathy. No pelvic lymphadenopathy. Reproductive: Uterus and adnexa unremarkable. Other: No free fluid. Musculoskeletal: No aggressive osseous lesion. IMPRESSION: 1. Segments of cortical hypoenhancement in the RIGHT kidney and LEFT kidney most consistent with multifocal focal pyelonephritis. Renal infarctions are considered but not favored. 2. No evidence of ureteritis. 3. Mucosal enhancement within the collapsed bladder suggests cystitis. Foley catheter in place. 4. No evidence of bowel inflammation. Electronically Signed   By: Genevive Bi M.D.   On: 03/08/2023 19:47   DG Chest Port 1 View Result Date: 03/08/2023 CLINICAL DATA:  Fever. EXAM: PORTABLE CHEST 1 VIEW COMPARISON:  12/14/2020 FINDINGS: Stable positioning  of left chest port. The heart is normal in size, mediastinal  contours are stable. Mild retrocardiac atelectasis or scarring. No confluent airspace disease. No pleural effusion or pneumothorax. IMPRESSION: Retrocardiac atelectasis or scarring. No confluent airspace disease. Electronically Signed   By: Narda Rutherford M.D.   On: 03/08/2023 16:39    Labs:  CBC: Recent Labs    03/09/23 0557 03/11/23 0517 03/22/23 1011 03/23/23 1153  WBC 14.3* 6.9 4.4 4.4  HGB 13.2 10.3* 11.4* 11.9*  HCT 39.9 30.1* 35.5* 36.8  PLT 133* 149* 224 209    COAGS: Recent Labs    03/08/23 1514  INR 1.2    BMP: Recent Labs    03/08/23 1514 03/09/23 0557 03/11/23 0517 03/12/23 0438  NA 128* 132* 135 136  K 3.1* 3.8 2.9* 3.7  CL 96* 103 104 104  CO2 19* 19* 23 24  GLUCOSE 140* 110* 86 87  BUN 26* 22 12 10   CALCIUM 8.7* 8.6* 8.0* 8.4*  CREATININE 0.69 0.59 0.41* 0.39*  GFRNONAA >60 >60 >60 >60    LIVER FUNCTION TESTS: Recent Labs    07/07/22 1150 12/07/22 1117 03/08/23 1514 03/09/23 0557  BILITOT 0.3 0.5 1.3* 0.8  AST 24 21 45* 76*  ALT 31* 23 45* 61*  ALKPHOS  --   --  84 78  PROT 7.3 7.9 7.6 7.5  ALBUMIN  --   --  3.2* 2.9*    TUMOR MARKERS: No results for input(s): "AFPTM", "CEA", "CA199", "CHROMGRNA" in the last 8760 hours.  Assessment and Plan:  63 y.o. female with a history of seizures, IBS, GERD, breast cancer s/p chemo, and multiple sclerosis presenting d/t neurogenic bladder with chronic foley. Patient was recently discharged from [facility] on 03/13/23 for sepsis d/t multifocal bilateral pyelonephritis. She has been following with urology for neurogenic bladder for many years. Previously not agreeable to suprapubic catheter placement d/t concerns with transportation issues. However, following recent admission and discussions with Urologist, patient would like to move forward with SPT placement. Referred to IR as an outpatient for SPT placement.  Plan for suprapubic catheter placement on 03/23/23 with Dr. Collier Salina  Risks and benefits  discussed with the patient including bleeding, infection, damage to adjacent structures, bowel perforation/fistula connection, and sepsis.  All of the patient's questions were answered, patient is agreeable to proceed. Consent signed and in chart.   Thank you for this interesting consult. I greatly enjoyed meeting Rebecca [name] and look forward to participating in their care. A copy of this report was sent to the requesting provider on this date.  Electronically Signed: Jama Flavors, PA-C 03/23/2023, 12:08 PM   I spent a total of  30 Minutes in face to face clinical consultation, greater than 50% of which was counseling/coordinating care for suprapubic catheter placement.

## 2023-03-23 ENCOUNTER — Encounter: Payer: Self-pay | Admitting: Radiology

## 2023-03-23 ENCOUNTER — Other Ambulatory Visit: Payer: Self-pay

## 2023-03-23 ENCOUNTER — Other Ambulatory Visit: Payer: Self-pay | Admitting: Physician Assistant

## 2023-03-23 ENCOUNTER — Ambulatory Visit
Admission: RE | Admit: 2023-03-23 | Discharge: 2023-03-23 | Disposition: A | Discharge status: Home / Self Care | Payer: 59 | Source: Ambulatory Visit | Attending: Physician Assistant | Admitting: Physician Assistant

## 2023-03-23 VITALS — BP 140/99 | HR 97 | Temp 98.3°F | Resp 17 | Ht 64.0 in

## 2023-03-23 DIAGNOSIS — S3729XA Other injury of bladder, initial encounter: Secondary | ICD-10-CM | POA: Diagnosis not present

## 2023-03-23 DIAGNOSIS — I5032 Chronic diastolic (congestive) heart failure: Secondary | ICD-10-CM | POA: Diagnosis not present

## 2023-03-23 DIAGNOSIS — N319 Neuromuscular dysfunction of bladder, unspecified: Secondary | ICD-10-CM | POA: Insufficient documentation

## 2023-03-23 DIAGNOSIS — N390 Urinary tract infection, site not specified: Secondary | ICD-10-CM | POA: Diagnosis not present

## 2023-03-23 DIAGNOSIS — I499 Cardiac arrhythmia, unspecified: Secondary | ICD-10-CM | POA: Diagnosis not present

## 2023-03-23 DIAGNOSIS — G40909 Epilepsy, unspecified, not intractable, without status epilepticus: Secondary | ICD-10-CM | POA: Diagnosis not present

## 2023-03-23 DIAGNOSIS — K449 Diaphragmatic hernia without obstruction or gangrene: Secondary | ICD-10-CM | POA: Diagnosis not present

## 2023-03-23 DIAGNOSIS — Z136 Encounter for screening for cardiovascular disorders: Secondary | ICD-10-CM | POA: Diagnosis not present

## 2023-03-23 DIAGNOSIS — G35 Multiple sclerosis: Secondary | ICD-10-CM | POA: Diagnosis not present

## 2023-03-23 DIAGNOSIS — Z743 Need for continuous supervision: Secondary | ICD-10-CM | POA: Diagnosis not present

## 2023-03-23 DIAGNOSIS — Z79899 Other long term (current) drug therapy: Secondary | ICD-10-CM | POA: Diagnosis not present

## 2023-03-23 DIAGNOSIS — S3720XA Unspecified injury of bladder, initial encounter: Secondary | ICD-10-CM | POA: Diagnosis not present

## 2023-03-23 DIAGNOSIS — Z8249 Family history of ischemic heart disease and other diseases of the circulatory system: Secondary | ICD-10-CM | POA: Diagnosis not present

## 2023-03-23 DIAGNOSIS — T8463XA Infection and inflammatory reaction due to internal fixation device of spine, initial encounter: Secondary | ICD-10-CM | POA: Diagnosis not present

## 2023-03-23 DIAGNOSIS — T83511A Infection and inflammatory reaction due to indwelling urethral catheter, initial encounter: Secondary | ICD-10-CM | POA: Diagnosis not present

## 2023-03-23 DIAGNOSIS — D649 Anemia, unspecified: Secondary | ICD-10-CM | POA: Diagnosis not present

## 2023-03-23 DIAGNOSIS — L89152 Pressure ulcer of sacral region, stage 2: Secondary | ICD-10-CM | POA: Diagnosis not present

## 2023-03-23 DIAGNOSIS — R509 Fever, unspecified: Secondary | ICD-10-CM | POA: Diagnosis not present

## 2023-03-23 DIAGNOSIS — Z1152 Encounter for screening for COVID-19: Secondary | ICD-10-CM | POA: Diagnosis not present

## 2023-03-23 DIAGNOSIS — R531 Weakness: Secondary | ICD-10-CM | POA: Diagnosis not present

## 2023-03-23 DIAGNOSIS — E669 Obesity, unspecified: Secondary | ICD-10-CM | POA: Diagnosis not present

## 2023-03-23 DIAGNOSIS — R0902 Hypoxemia: Secondary | ICD-10-CM | POA: Diagnosis not present

## 2023-03-23 DIAGNOSIS — Z1611 Resistance to penicillins: Secondary | ICD-10-CM | POA: Diagnosis not present

## 2023-03-23 DIAGNOSIS — E871 Hypo-osmolality and hyponatremia: Secondary | ICD-10-CM | POA: Diagnosis not present

## 2023-03-23 DIAGNOSIS — N12 Tubulo-interstitial nephritis, not specified as acute or chronic: Secondary | ICD-10-CM | POA: Diagnosis not present

## 2023-03-23 DIAGNOSIS — E876 Hypokalemia: Secondary | ICD-10-CM | POA: Diagnosis not present

## 2023-03-23 DIAGNOSIS — N3090 Cystitis, unspecified without hematuria: Secondary | ICD-10-CM | POA: Diagnosis not present

## 2023-03-23 DIAGNOSIS — R000 Tachycardia, unspecified: Secondary | ICD-10-CM | POA: Diagnosis not present

## 2023-03-23 DIAGNOSIS — B370 Candidal stomatitis: Secondary | ICD-10-CM | POA: Diagnosis not present

## 2023-03-23 DIAGNOSIS — R7881 Bacteremia: Secondary | ICD-10-CM | POA: Diagnosis not present

## 2023-03-23 DIAGNOSIS — A419 Sepsis, unspecified organism: Secondary | ICD-10-CM | POA: Diagnosis not present

## 2023-03-23 DIAGNOSIS — K8689 Other specified diseases of pancreas: Secondary | ICD-10-CM | POA: Diagnosis not present

## 2023-03-23 DIAGNOSIS — Z5982 Transportation insecurity: Secondary | ICD-10-CM | POA: Insufficient documentation

## 2023-03-23 DIAGNOSIS — B954 Other streptococcus as the cause of diseases classified elsewhere: Secondary | ICD-10-CM | POA: Diagnosis not present

## 2023-03-23 DIAGNOSIS — I878 Other specified disorders of veins: Secondary | ICD-10-CM | POA: Diagnosis not present

## 2023-03-23 DIAGNOSIS — Z0389 Encounter for observation for other suspected diseases and conditions ruled out: Secondary | ICD-10-CM | POA: Diagnosis not present

## 2023-03-23 DIAGNOSIS — G8220 Paraplegia, unspecified: Secondary | ICD-10-CM | POA: Diagnosis not present

## 2023-03-23 DIAGNOSIS — Z1623 Resistance to quinolones and fluoroquinolones: Secondary | ICD-10-CM | POA: Diagnosis not present

## 2023-03-23 DIAGNOSIS — B9620 Unspecified Escherichia coli [E. coli] as the cause of diseases classified elsewhere: Secondary | ICD-10-CM | POA: Diagnosis not present

## 2023-03-23 DIAGNOSIS — Z9359 Other cystostomy status: Secondary | ICD-10-CM | POA: Diagnosis not present

## 2023-03-23 DIAGNOSIS — K5900 Constipation, unspecified: Secondary | ICD-10-CM | POA: Diagnosis not present

## 2023-03-23 DIAGNOSIS — R319 Hematuria, unspecified: Secondary | ICD-10-CM | POA: Diagnosis not present

## 2023-03-23 DIAGNOSIS — I10 Essential (primary) hypertension: Secondary | ICD-10-CM | POA: Diagnosis not present

## 2023-03-23 DIAGNOSIS — N10 Acute tubulo-interstitial nephritis: Secondary | ICD-10-CM | POA: Diagnosis not present

## 2023-03-23 DIAGNOSIS — N029 Recurrent and persistent hematuria with unspecified morphologic changes: Secondary | ICD-10-CM | POA: Diagnosis not present

## 2023-03-23 DIAGNOSIS — Z7401 Bed confinement status: Secondary | ICD-10-CM | POA: Diagnosis not present

## 2023-03-23 HISTORY — PX: IR CYSTOSTOMY TUBE PLACEMENT/BLADDER ASPIRATION: IMG1097

## 2023-03-23 LAB — PROTIME-INR
INR: 1 (ref 0.8–1.2)
Prothrombin Time: 13.8 s (ref 11.4–15.2)

## 2023-03-23 LAB — CANCER ANTIGEN 27.29: CA 27.29: 26.1 U/mL (ref 0.0–38.6)

## 2023-03-23 LAB — CBC
HCT: 36.8 % (ref 36.0–46.0)
Hemoglobin: 11.9 g/dL — ABNORMAL LOW (ref 12.0–15.0)
MCH: 28.9 pg (ref 26.0–34.0)
MCHC: 32.3 g/dL (ref 30.0–36.0)
MCV: 89.3 fL (ref 80.0–100.0)
Platelets: 209 10*3/uL (ref 150–400)
RBC: 4.12 MIL/uL (ref 3.87–5.11)
RDW: 13.9 % (ref 11.5–15.5)
WBC: 4.4 10*3/uL (ref 4.0–10.5)
nRBC: 0 % (ref 0.0–0.2)

## 2023-03-23 MED ORDER — LIDOCAINE HCL 1 % IJ SOLN
5.0000 mL | Freq: Once | INTRAMUSCULAR | Status: AC
  Administered 2023-03-23: 5 mL via INTRADERMAL

## 2023-03-23 MED ORDER — SODIUM CHLORIDE 0.9% FLUSH: 5.0000 mL | Freq: Three times a day (TID) | INTRAVENOUS | Status: DC

## 2023-03-23 MED ORDER — IOHEXOL 300 MG/ML  SOLN
8.0000 mL | Freq: Once | INTRAMUSCULAR | Status: DC | PRN
Start: 2023-03-23 — End: 2023-03-24

## 2023-03-23 MED ORDER — MIDAZOLAM HCL 2 MG/2ML IJ SOLN
INTRAMUSCULAR | Status: AC
  Filled 2023-03-23: qty 2

## 2023-03-23 MED ORDER — HEPARIN SOD (PORK) LOCK FLUSH 100 UNIT/ML IV SOLN
INTRAVENOUS | Status: AC
  Filled 2023-03-23: qty 5

## 2023-03-23 MED ORDER — MIDAZOLAM HCL 2 MG/2ML IJ SOLN
INTRAMUSCULAR | Status: AC | PRN
  Administered 2023-03-23 (×2): 1 mg via INTRAVENOUS

## 2023-03-23 MED ORDER — LIDOCAINE HCL 1 % IJ SOLN
INTRAMUSCULAR | Status: AC
  Filled 2023-03-23: qty 20

## 2023-03-23 MED ORDER — ONDANSETRON HCL 4 MG/2ML IJ SOLN
INTRAMUSCULAR | Status: AC
  Filled 2023-03-23: qty 2

## 2023-03-23 MED ORDER — HYDROMORPHONE HCL 1 MG/ML IJ SOLN
INTRAMUSCULAR | Status: AC
  Filled 2023-03-23: qty 0.5

## 2023-03-23 MED ORDER — ONDANSETRON HCL 4 MG/2ML IJ SOLN
INTRAMUSCULAR | Status: AC | PRN
  Administered 2023-03-23: 4 mg via INTRAVENOUS

## 2023-03-23 MED ORDER — MIDAZOLAM HCL 5 MG/5ML IJ SOLN
INTRAMUSCULAR | Status: AC | PRN
  Administered 2023-03-23: 1 mg via INTRAVENOUS

## 2023-03-23 MED ORDER — HYDROMORPHONE HCL 1 MG/ML IJ SOLN
INTRAMUSCULAR | Status: AC | PRN
  Administered 2023-03-23: .5 mg via INTRAVENOUS

## 2023-03-23 NOTE — Progress Notes (Signed)
[facility] is here to transport patient home.

## 2023-03-23 NOTE — Progress Notes (Signed)
During process of procedure, had to abort, patient having large bowel movement along with foley balloon ruptered and came out, to clean patient and foley reinserted per sterile protocol per Moreen Fowler RN, and will restart procedure asap.

## 2023-03-23 NOTE — Procedures (Signed)
Interventional Radiology Procedure Note  Procedure: [place] AND FLUORO 16 FR SP TUBE PLACEMENT    Complications: None  Estimated Blood Loss:  MIN  Findings: 16 FR MPD    M. Ruel Favors, MD

## 2023-03-24 ENCOUNTER — Other Ambulatory Visit: Payer: Self-pay

## 2023-03-24 ENCOUNTER — Inpatient Hospital Stay
Admission: EM | Admit: 2023-03-24 | Discharge: 2023-04-02 | DRG: 698 | Disposition: A | Discharge status: Home Health | Payer: 59 | Attending: Internal Medicine | Admitting: Internal Medicine

## 2023-03-24 ENCOUNTER — Emergency Department: Payer: 59

## 2023-03-24 ENCOUNTER — Ambulatory Visit: Payer: Self-pay | Admitting: *Deleted

## 2023-03-24 DIAGNOSIS — N10 Acute tubulo-interstitial nephritis: Secondary | ICD-10-CM | POA: Diagnosis present

## 2023-03-24 DIAGNOSIS — D649 Anemia, unspecified: Secondary | ICD-10-CM | POA: Diagnosis present

## 2023-03-24 DIAGNOSIS — Z79899 Other long term (current) drug therapy: Secondary | ICD-10-CM

## 2023-03-24 DIAGNOSIS — L89152 Pressure ulcer of sacral region, stage 2: Secondary | ICD-10-CM | POA: Diagnosis present

## 2023-03-24 DIAGNOSIS — G8220 Paraplegia, unspecified: Secondary | ICD-10-CM | POA: Diagnosis present

## 2023-03-24 DIAGNOSIS — K8689 Other specified diseases of pancreas: Secondary | ICD-10-CM | POA: Diagnosis not present

## 2023-03-24 DIAGNOSIS — K5900 Constipation, unspecified: Secondary | ICD-10-CM | POA: Diagnosis not present

## 2023-03-24 DIAGNOSIS — R509 Fever, unspecified: Secondary | ICD-10-CM | POA: Diagnosis not present

## 2023-03-24 DIAGNOSIS — K449 Diaphragmatic hernia without obstruction or gangrene: Secondary | ICD-10-CM | POA: Diagnosis present

## 2023-03-24 DIAGNOSIS — R319 Hematuria, unspecified: Secondary | ICD-10-CM | POA: Diagnosis not present

## 2023-03-24 DIAGNOSIS — Y846 Urinary catheterization as the cause of abnormal reaction of the patient, or of later complication, without mention of misadventure at the time of the procedure: Secondary | ICD-10-CM | POA: Diagnosis present

## 2023-03-24 DIAGNOSIS — E871 Hypo-osmolality and hyponatremia: Secondary | ICD-10-CM | POA: Diagnosis present

## 2023-03-24 DIAGNOSIS — G40209 Localization-related (focal) (partial) symptomatic epilepsy and epileptic syndromes with complex partial seizures, not intractable, without status epilepticus: Secondary | ICD-10-CM | POA: Diagnosis present

## 2023-03-24 DIAGNOSIS — G35D Multiple sclerosis, unspecified: Secondary | ICD-10-CM | POA: Diagnosis present

## 2023-03-24 DIAGNOSIS — Z5982 Transportation insecurity: Secondary | ICD-10-CM

## 2023-03-24 DIAGNOSIS — Z1152 Encounter for screening for COVID-19: Secondary | ICD-10-CM | POA: Diagnosis not present

## 2023-03-24 DIAGNOSIS — N319 Neuromuscular dysfunction of bladder, unspecified: Secondary | ICD-10-CM | POA: Diagnosis present

## 2023-03-24 DIAGNOSIS — G35 Multiple sclerosis: Secondary | ICD-10-CM | POA: Diagnosis present

## 2023-03-24 DIAGNOSIS — R000 Tachycardia, unspecified: Secondary | ICD-10-CM | POA: Diagnosis not present

## 2023-03-24 DIAGNOSIS — N029 Recurrent and persistent hematuria with unspecified morphologic changes: Secondary | ICD-10-CM | POA: Diagnosis not present

## 2023-03-24 DIAGNOSIS — E669 Obesity, unspecified: Secondary | ICD-10-CM

## 2023-03-24 DIAGNOSIS — A4151 Sepsis due to Escherichia coli [E. coli]: Secondary | ICD-10-CM | POA: Diagnosis present

## 2023-03-24 DIAGNOSIS — S3720XA Unspecified injury of bladder, initial encounter: Secondary | ICD-10-CM | POA: Diagnosis not present

## 2023-03-24 DIAGNOSIS — Z8249 Family history of ischemic heart disease and other diseases of the circulatory system: Secondary | ICD-10-CM | POA: Diagnosis not present

## 2023-03-24 DIAGNOSIS — B9620 Unspecified Escherichia coli [E. coli] as the cause of diseases classified elsewhere: Secondary | ICD-10-CM

## 2023-03-24 DIAGNOSIS — Z1623 Resistance to quinolones and fluoroquinolones: Secondary | ICD-10-CM | POA: Diagnosis present

## 2023-03-24 DIAGNOSIS — N820 Vesicovaginal fistula: Secondary | ICD-10-CM | POA: Diagnosis not present

## 2023-03-24 DIAGNOSIS — A419 Sepsis, unspecified organism: Secondary | ICD-10-CM | POA: Diagnosis present

## 2023-03-24 DIAGNOSIS — Z7401 Bed confinement status: Secondary | ICD-10-CM

## 2023-03-24 DIAGNOSIS — I5032 Chronic diastolic (congestive) heart failure: Secondary | ICD-10-CM | POA: Diagnosis present

## 2023-03-24 DIAGNOSIS — Z1611 Resistance to penicillins: Secondary | ICD-10-CM | POA: Diagnosis present

## 2023-03-24 DIAGNOSIS — E876 Hypokalemia: Secondary | ICD-10-CM | POA: Diagnosis present

## 2023-03-24 DIAGNOSIS — G40909 Epilepsy, unspecified, not intractable, without status epilepticus: Secondary | ICD-10-CM

## 2023-03-24 DIAGNOSIS — I878 Other specified disorders of veins: Secondary | ICD-10-CM | POA: Diagnosis present

## 2023-03-24 DIAGNOSIS — Z8619 Personal history of other infectious and parasitic diseases: Secondary | ICD-10-CM

## 2023-03-24 DIAGNOSIS — Z9221 Personal history of antineoplastic chemotherapy: Secondary | ICD-10-CM

## 2023-03-24 DIAGNOSIS — Z9011 Acquired absence of right breast and nipple: Secondary | ICD-10-CM

## 2023-03-24 DIAGNOSIS — S3729XA Other injury of bladder, initial encounter: Secondary | ICD-10-CM | POA: Diagnosis not present

## 2023-03-24 DIAGNOSIS — Z882 Allergy status to sulfonamides status: Secondary | ICD-10-CM

## 2023-03-24 DIAGNOSIS — T8463XA Infection and inflammatory reaction due to internal fixation device of spine, initial encounter: Secondary | ICD-10-CM | POA: Diagnosis not present

## 2023-03-24 DIAGNOSIS — Z6835 Body mass index (BMI) 35.0-35.9, adult: Secondary | ICD-10-CM

## 2023-03-24 DIAGNOSIS — N390 Urinary tract infection, site not specified: Secondary | ICD-10-CM | POA: Diagnosis not present

## 2023-03-24 DIAGNOSIS — R7881 Bacteremia: Secondary | ICD-10-CM

## 2023-03-24 DIAGNOSIS — Z803 Family history of malignant neoplasm of breast: Secondary | ICD-10-CM

## 2023-03-24 DIAGNOSIS — N12 Tubulo-interstitial nephritis, not specified as acute or chronic: Principal | ICD-10-CM

## 2023-03-24 DIAGNOSIS — Z853 Personal history of malignant neoplasm of breast: Secondary | ICD-10-CM

## 2023-03-24 DIAGNOSIS — Z8349 Family history of other endocrine, nutritional and metabolic diseases: Secondary | ICD-10-CM

## 2023-03-24 DIAGNOSIS — B370 Candidal stomatitis: Secondary | ICD-10-CM | POA: Diagnosis present

## 2023-03-24 DIAGNOSIS — N3090 Cystitis, unspecified without hematuria: Secondary | ICD-10-CM | POA: Diagnosis not present

## 2023-03-24 DIAGNOSIS — B954 Other streptococcus as the cause of diseases classified elsewhere: Secondary | ICD-10-CM | POA: Diagnosis not present

## 2023-03-24 DIAGNOSIS — Z0389 Encounter for observation for other suspected diseases and conditions ruled out: Secondary | ICD-10-CM | POA: Diagnosis not present

## 2023-03-24 DIAGNOSIS — Z8041 Family history of malignant neoplasm of ovary: Secondary | ICD-10-CM

## 2023-03-24 DIAGNOSIS — Z17421 Hormone receptor negative with human epidermal growth factor receptor 2 negative status: Secondary | ICD-10-CM

## 2023-03-24 DIAGNOSIS — T83511A Infection and inflammatory reaction due to indwelling urethral catheter, initial encounter: Principal | ICD-10-CM | POA: Diagnosis present

## 2023-03-24 DIAGNOSIS — Z9359 Other cystostomy status: Secondary | ICD-10-CM | POA: Diagnosis not present

## 2023-03-24 DIAGNOSIS — Z885 Allergy status to narcotic agent status: Secondary | ICD-10-CM

## 2023-03-24 LAB — CBC WITH DIFFERENTIAL/PLATELET
Abs Immature Granulocytes: 0.11 10*3/uL — ABNORMAL HIGH (ref 0.00–0.07)
Basophils Absolute: 0 10*3/uL (ref 0.0–0.1)
Basophils Relative: 0 %
Eosinophils Absolute: 0 10*3/uL (ref 0.0–0.5)
Eosinophils Relative: 0 %
HCT: 38.9 % (ref 36.0–46.0)
Hemoglobin: 12.6 g/dL (ref 12.0–15.0)
Immature Granulocytes: 1 %
Lymphocytes Relative: 4 %
Lymphs Abs: 0.7 10*3/uL (ref 0.7–4.0)
MCH: 29 pg (ref 26.0–34.0)
MCHC: 32.4 g/dL (ref 30.0–36.0)
MCV: 89.6 fL (ref 80.0–100.0)
Monocytes Absolute: 0.5 10*3/uL (ref 0.1–1.0)
Monocytes Relative: 3 %
Neutro Abs: 14.3 10*3/uL — ABNORMAL HIGH (ref 1.7–7.7)
Neutrophils Relative %: 92 %
Platelets: 166 10*3/uL (ref 150–400)
RBC: 4.34 MIL/uL (ref 3.87–5.11)
RDW: 14.1 % (ref 11.5–15.5)
WBC: 15.6 10*3/uL — ABNORMAL HIGH (ref 4.0–10.5)
nRBC: 0 % (ref 0.0–0.2)

## 2023-03-24 LAB — COMPREHENSIVE METABOLIC PANEL
ALT: 32 U/L (ref 0–44)
AST: 30 U/L (ref 15–41)
Albumin: 3.1 g/dL — ABNORMAL LOW (ref 3.5–5.0)
Alkaline Phosphatase: 95 U/L (ref 38–126)
Anion gap: 11 (ref 5–15)
BUN: 14 mg/dL (ref 8–23)
CO2: 23 mmol/L (ref 22–32)
Calcium: 8.9 mg/dL (ref 8.9–10.3)
Chloride: 100 mmol/L (ref 98–111)
Creatinine, Ser: 0.57 mg/dL (ref 0.44–1.00)
GFR, Estimated: >60 mL/min (ref >60)
Glucose, Bld: 151 mg/dL — ABNORMAL HIGH (ref 70–99)
Potassium: 3.5 mmol/L (ref 3.5–5.1)
Sodium: 134 mmol/L — ABNORMAL LOW (ref 135–145)
Total Bilirubin: 1 mg/dL (ref 0.0–1.2)
Total Protein: 7.5 g/dL (ref 6.5–8.1)

## 2023-03-24 LAB — URINALYSIS, W/ REFLEX TO CULTURE (INFECTION SUSPECTED)
RBC / HPF: >50 RBC/hpf (ref 0–5)
WBC, UA: >50 WBC/hpf (ref 0–5)

## 2023-03-24 LAB — BRAIN NATRIURETIC PEPTIDE: B Natriuretic Peptide: 145.3 pg/mL — ABNORMAL HIGH (ref 0.0–100.0)

## 2023-03-24 LAB — RESP PANEL BY RT-PCR (RSV, FLU A&B, COVID)  RVPGX2
Influenza A by PCR: NEGATIVE
Influenza B by PCR: NEGATIVE
Resp Syncytial Virus by PCR: NEGATIVE
SARS Coronavirus 2 by RT PCR: NEGATIVE

## 2023-03-24 LAB — LACTIC ACID, PLASMA
Lactic Acid, Venous: 1.1 mmol/L (ref 0.5–1.9)
Lactic Acid, Venous: 1.4 mmol/L (ref 0.5–1.9)

## 2023-03-24 LAB — APTT: aPTT: 37 s — ABNORMAL HIGH (ref 24–36)

## 2023-03-24 LAB — PROTIME-INR
INR: 1.2 (ref 0.8–1.2)
Prothrombin Time: 15 s (ref 11.4–15.2)

## 2023-03-24 MED ORDER — LORAZEPAM 2 MG/ML IJ SOLN
2.0000 mg | INTRAMUSCULAR | Status: DC | PRN
Start: 2023-03-24 — End: 2023-04-01

## 2023-03-24 MED ORDER — ACETAMINOPHEN 500 MG PO TABS
1000.0000 mg | ORAL_TABLET | Freq: Once | ORAL | Status: AC
  Administered 2023-03-24: 1000 mg via ORAL
  Filled 2023-03-24: qty 2

## 2023-03-24 MED ORDER — IOHEXOL 300 MG/ML  SOLN
100.0000 mL | Freq: Once | INTRAMUSCULAR | Status: AC | PRN
  Administered 2023-03-24: 100 mL via INTRAVENOUS

## 2023-03-24 MED ORDER — SODIUM CHLORIDE 0.9 % IV SOLN
1.0000 g | Freq: Three times a day (TID) | INTRAVENOUS | Status: DC
  Administered 2023-03-25 – 2023-03-27 (×8): 1 g via INTRAVENOUS
  Filled 2023-03-24 (×9): qty 20

## 2023-03-24 MED ORDER — LACTATED RINGERS IV BOLUS
1000.0000 mL | Freq: Once | INTRAVENOUS | Status: AC
  Administered 2023-03-24: 1000 mL via INTRAVENOUS

## 2023-03-24 MED ORDER — PANCRELIPASE (LIP-PROT-AMYL) 12000-38000 UNITS PO CPEP
72000.0000 [IU] | ORAL_CAPSULE | Freq: Three times a day (TID) | ORAL | Status: DC
  Administered 2023-03-25 – 2023-04-02 (×24): 72000 [IU] via ORAL
  Filled 2023-03-24 (×2): qty 2
  Filled 2023-03-24 (×4): qty 6
  Filled 2023-03-24: qty 2
  Filled 2023-03-24 (×2): qty 6
  Filled 2023-03-24: qty 2
  Filled 2023-03-24: qty 6
  Filled 2023-03-24: qty 2
  Filled 2023-03-24 (×4): qty 6
  Filled 2023-03-24: qty 2
  Filled 2023-03-24 (×2): qty 6
  Filled 2023-03-24: qty 2
  Filled 2023-03-24 (×5): qty 6
  Filled 2023-03-24: qty 2

## 2023-03-24 MED ORDER — NYSTATIN 100000 UNIT/GM EX POWD: 1.0000 | Freq: Three times a day (TID) | CUTANEOUS | Status: DC | PRN

## 2023-03-24 MED ORDER — LORATADINE 10 MG PO TABS
10.0000 mg | ORAL_TABLET | Freq: Every day | ORAL | Status: DC
  Administered 2023-03-24 – 2023-04-02 (×10): 10 mg via ORAL
  Filled 2023-03-24 (×10): qty 1

## 2023-03-24 MED ORDER — BACLOFEN 10 MG PO TABS
10.0000 mg | ORAL_TABLET | Freq: Three times a day (TID) | ORAL | Status: DC
  Administered 2023-03-25 – 2023-03-27 (×9): 20 mg via ORAL
  Administered 2023-03-27: 10 mg via ORAL
  Administered 2023-03-28 (×3): 20 mg via ORAL
  Administered 2023-03-29 – 2023-03-30 (×4): 10 mg via ORAL
  Administered 2023-03-30 (×2): 20 mg via ORAL
  Administered 2023-03-31: 10 mg via ORAL
  Administered 2023-03-31 – 2023-04-02 (×6): 20 mg via ORAL
  Filled 2023-03-24 (×5): qty 2
  Filled 2023-03-24: qty 1
  Filled 2023-03-24 (×9): qty 2
  Filled 2023-03-24: qty 1
  Filled 2023-03-24: qty 2
  Filled 2023-03-24: qty 1
  Filled 2023-03-24 (×6): qty 2
  Filled 2023-03-24: qty 1
  Filled 2023-03-24: qty 2
  Filled 2023-03-24: qty 1
  Filled 2023-03-24 (×2): qty 2

## 2023-03-24 MED ORDER — PANCRELIPASE (LIP-PROT-AMYL) 12000-38000 UNITS PO CPEP
36000.0000 [IU] | ORAL_CAPSULE | Freq: Three times a day (TID) | ORAL | Status: DC
  Administered 2023-03-25 – 2023-04-02 (×23): 36000 [IU] via ORAL
  Filled 2023-03-24: qty 3
  Filled 2023-03-24: qty 1
  Filled 2023-03-24 (×2): qty 3
  Filled 2023-03-24: qty 1
  Filled 2023-03-24: qty 3
  Filled 2023-03-24 (×2): qty 1
  Filled 2023-03-24 (×5): qty 3
  Filled 2023-03-24: qty 1
  Filled 2023-03-24 (×7): qty 3
  Filled 2023-03-24: qty 1
  Filled 2023-03-24: qty 3
  Filled 2023-03-24 (×2): qty 1

## 2023-03-24 MED ORDER — IBUPROFEN 600 MG PO TABS
600.0000 mg | ORAL_TABLET | Freq: Once | ORAL | Status: AC
  Administered 2023-03-24: 600 mg via ORAL
  Filled 2023-03-24: qty 1

## 2023-03-24 MED ORDER — ONDANSETRON HCL 4 MG/2ML IJ SOLN
4.0000 mg | Freq: Three times a day (TID) | INTRAMUSCULAR | Status: DC | PRN
  Administered 2023-03-26 – 2023-03-27 (×2): 4 mg via INTRAVENOUS
  Filled 2023-03-24 (×2): qty 2

## 2023-03-24 MED ORDER — OXYBUTYNIN CHLORIDE ER 10 MG PO TB24
10.0000 mg | ORAL_TABLET | Freq: Two times a day (BID) | ORAL | Status: DC
  Administered 2023-03-25 – 2023-04-02 (×18): 10 mg via ORAL
  Filled 2023-03-24 (×19): qty 1

## 2023-03-24 MED ORDER — LEG VEIN & CIRCULATION PO TABS: 1.0000 | ORAL_TABLET | Freq: Two times a day (BID) | ORAL | Status: DC

## 2023-03-24 MED ORDER — RENACIDIN IR SOLN: 30.0000 mL | Freq: Two times a day (BID) | Status: DC

## 2023-03-24 MED ORDER — ADULT MULTIVITAMIN W/MINERALS CH
1.0000 | ORAL_TABLET | Freq: Every day | ORAL | Status: DC
  Administered 2023-03-24 – 2023-04-02 (×10): 1 via ORAL
  Filled 2023-03-24 (×10): qty 1

## 2023-03-24 MED ORDER — TIZANIDINE HCL 4 MG PO TABS
4.0000 mg | ORAL_TABLET | Freq: Every day | ORAL | Status: DC
  Administered 2023-03-25 – 2023-04-01 (×9): 4 mg via ORAL
  Filled 2023-03-24 (×10): qty 1

## 2023-03-24 MED ORDER — PANTOPRAZOLE SODIUM 40 MG PO TBEC
40.0000 mg | DELAYED_RELEASE_TABLET | Freq: Every day | ORAL | Status: DC
  Administered 2023-03-24 – 2023-04-02 (×10): 40 mg via ORAL
  Filled 2023-03-24 (×10): qty 1

## 2023-03-24 MED ORDER — IBUPROFEN 400 MG PO TABS: 400.0000 mg | ORAL_TABLET | Freq: Once | ORAL | Status: DC

## 2023-03-24 MED ORDER — ACETAMINOPHEN 325 MG PO TABS
650.0000 mg | ORAL_TABLET | Freq: Four times a day (QID) | ORAL | Status: DC | PRN
  Administered 2023-03-25 – 2023-04-01 (×3): 650 mg via ORAL
  Filled 2023-03-24 (×2): qty 2

## 2023-03-24 MED ORDER — VANCOMYCIN HCL IN DEXTROSE 1-5 GM/200ML-% IV SOLN
1000.0000 mg | Freq: Once | INTRAVENOUS | Status: AC
  Administered 2023-03-24: 1000 mg via INTRAVENOUS
  Filled 2023-03-24: qty 200

## 2023-03-24 MED ORDER — METRONIDAZOLE 500 MG/100ML IV SOLN
500.0000 mg | Freq: Once | INTRAVENOUS | Status: AC
  Administered 2023-03-24: 500 mg via INTRAVENOUS
  Filled 2023-03-24: qty 100

## 2023-03-24 MED ORDER — LACTATED RINGERS IV BOLUS (SEPSIS)
1000.0000 mL | Freq: Once | INTRAVENOUS | Status: AC
  Administered 2023-03-24: 1000 mL via INTRAVENOUS

## 2023-03-24 MED ORDER — GLUCOSAMINE-CHONDROITIN 500-400 MG PO CAPS: ORAL_CAPSULE | Freq: Three times a day (TID) | ORAL | Status: DC

## 2023-03-24 MED ORDER — LEVETIRACETAM 500 MG PO TABS
500.0000 mg | ORAL_TABLET | Freq: Two times a day (BID) | ORAL | Status: DC
  Administered 2023-03-25 – 2023-04-02 (×18): 500 mg via ORAL
  Filled 2023-03-24 (×18): qty 1

## 2023-03-24 MED ORDER — TURMERIC 500 MG PO CAPS: 500.0000 mg | ORAL_CAPSULE | Freq: Every day | ORAL | Status: DC

## 2023-03-24 MED ORDER — SODIUM CHLORIDE 0.9 % IV SOLN
2.0000 g | Freq: Once | INTRAVENOUS | Status: AC
  Administered 2023-03-24: 2 g via INTRAVENOUS
  Filled 2023-03-24: qty 12.5

## 2023-03-24 MED ORDER — AMANTADINE HCL 100 MG PO CAPS
100.0000 mg | ORAL_CAPSULE | Freq: Three times a day (TID) | ORAL | Status: DC
  Administered 2023-03-25 – 2023-04-02 (×25): 100 mg via ORAL
  Filled 2023-03-24 (×28): qty 1

## 2023-03-24 MED ORDER — LACTATED RINGERS IV SOLN: INTRAVENOUS | Status: DC

## 2023-03-24 MED ORDER — ENOXAPARIN SODIUM 60 MG/0.6ML IJ SOSY
0.5000 mg/kg | PREFILLED_SYRINGE | INTRAMUSCULAR | Status: DC
Start: 2023-03-24 — End: 2023-03-29
  Administered 2023-03-25 – 2023-03-28 (×5): 47.5 mg via SUBCUTANEOUS
  Filled 2023-03-24 (×5): qty 0.6

## 2023-03-24 MED ORDER — ENOXAPARIN SODIUM 40 MG/0.4ML IJ SOSY: 40.0000 mg | PREFILLED_SYRINGE | INTRAMUSCULAR | Status: DC

## 2023-03-24 NOTE — ED Provider Notes (Signed)
[facility] Provider Note    Event Date/Time   First MD Initiated Contact with Patient 03/24/23 1254     (approximate)   History   Abdominal Pain   HPI Rebecca [name] is a 63 y.o. female with history of multiple sclerosis, neurogenic bladder, chronic indwelling Foley, spastic paraplegia, HFpEF presenting today for abdominal pain.  Patient has a history of chronic neurogenic bladder with chronic Foley in place.  Yesterday she was seen by IR and had a suprapubic catheter placed at that time.  She states last night into this morning she started developing worsening lower abdominal pain, fever, and chills.  She denies nausea, vomiting, chest pain, shortness of breath, diarrhea.  No drainage around the site and no obvious change that she is aware of in the urine color.  She does note fatigue.  She reports she had a temperature at home of the 11 F and EMS reported a temperature of 100.3 F.  Reviewed chart notes from IR placement of suprapubic catheter yesterday.     Physical Exam   Triage Vital Signs: ED Triage Vitals  Encounter Vitals Group     BP --      Systolic BP Percentile --      Diastolic BP Percentile --      Pulse --      Resp --      Temp --      Temp src --      SpO2 --      Weight 03/24/23 1301 208 lb (94.3 kg)     Height 03/24/23 1301 5\' 4"  (1.626 m)     Head Circumference --      Peak Flow --      Pain Score 03/24/23 1257 8     Pain Loc --      Pain Education --      Exclude from Growth Chart --     Most recent vital signs: Vitals:   03/25/23 0117 03/25/23 0500  BP: 106/68 110/67  Pulse: 88 77  Resp: 15 16  Temp: 98 F (36.7 C) 98.2 F (36.8 C)  SpO2: 97% 96%    I have reviewed the vital signs. General:  Awake, alert, fatigued appearing Head:  Normocephalic, Atraumatic. EENT:  PERRL, EOMI, Oral mucosa pink and moist, Neck is supple. Cardiovascular: Regular rate, 2+ distal pulses. Respiratory:  Normal respiratory effort,  symmetrical expansion, no distress.   Abdomen: Tenderness palpation in the lower regions with mild tenderness palpation in the upper regions Extremities: Paraplegia in the lower extremities. Neuro:  Alert and oriented.  Interacting appropriately.   Skin:  Warm, dry, no rash.   Psych: Appropriate affect.    ED Results / Procedures / Treatments   Labs (all labs ordered are listed, but only abnormal results are displayed) Labs Reviewed  COMPREHENSIVE METABOLIC PANEL - Abnormal; Notable for the following components:      Result Value   Sodium 134 (*)    Glucose, Bld 151 (*)    Albumin 3.1 (*)    All other components within normal limits  CBC WITH DIFFERENTIAL/PLATELET - Abnormal; Notable for the following components:   WBC 15.6 (*)    Neutro Abs 14.3 (*)    Abs Immature Granulocytes 0.11 (*)    All other components within normal limits  APTT - Abnormal; Notable for the following components:   aPTT 37 (*)    All other components within normal limits  URINALYSIS, W/ REFLEX TO CULTURE (  INFECTION SUSPECTED) - Abnormal; Notable for the following components:   Color, Urine RED (*)    APPearance TURBID (*)    Glucose, UA   (*)    Value: TEST NOT REPORTED DUE TO COLOR INTERFERENCE OF URINE PIGMENT   Hgb urine dipstick   (*)    Value: TEST NOT REPORTED DUE TO COLOR INTERFERENCE OF URINE PIGMENT   Bilirubin Urine   (*)    Value: TEST NOT REPORTED DUE TO COLOR INTERFERENCE OF URINE PIGMENT   Ketones, ur   (*)    Value: TEST NOT REPORTED DUE TO COLOR INTERFERENCE OF URINE PIGMENT   Protein, ur   (*)    Value: TEST NOT REPORTED DUE TO COLOR INTERFERENCE OF URINE PIGMENT   Nitrite   (*)    Value: TEST NOT REPORTED DUE TO COLOR INTERFERENCE OF URINE PIGMENT   Leukocytes,Ua   (*)    Value: TEST NOT REPORTED DUE TO COLOR INTERFERENCE OF URINE PIGMENT   Bacteria, UA MANY (*)    All other components within normal limits  BRAIN NATRIURETIC PEPTIDE - Abnormal; Notable for the following  components:   B Natriuretic Peptide 145.3 (*)    All other components within normal limits  BASIC METABOLIC PANEL - Abnormal; Notable for the following components:   Sodium 133 (*)    Potassium 3.4 (*)    CO2 21 (*)    Glucose, Bld 117 (*)    Calcium 8.5 (*)    All other components within normal limits  CBC - Abnormal; Notable for the following components:   WBC 12.7 (*)    RBC 3.59 (*)    Hemoglobin 10.6 (*)    HCT 32.1 (*)    Platelets 116 (*)    All other components within normal limits  RESP PANEL BY RT-PCR (RSV, FLU A&B, COVID)  RVPGX2  CULTURE, BLOOD (ROUTINE X 2)  CULTURE, BLOOD (ROUTINE X 2)  URINE CULTURE  LACTIC ACID, PLASMA  LACTIC ACID, PLASMA  PROTIME-INR     EKG My EKG interpretation: Rate of 109, sinus tachycardia.  Normal axis, normal intervals.  No acute ST elevations or depressions   RADIOLOGY Pending at time of signout   PROCEDURES:  Critical Care performed: Yes, see critical care procedure note(s)  .Critical Care  Performed by: Janith Lima, MD Authorized by: Janith Lima, MD   Critical care provider statement:    Critical care time (minutes):  30   Critical care was necessary to treat or prevent imminent or life-threatening deterioration of the following conditions:  Sepsis   Critical care was time spent personally by me on the following activities:  Development of treatment plan with patient or surrogate, discussions with consultants, evaluation of patient's response to treatment, examination of patient, ordering and review of laboratory studies, ordering and review of radiographic studies, ordering and performing treatments and interventions, pulse oximetry, re-evaluation of patient's condition and review of old charts   I assumed direction of critical care for this patient from another provider in my specialty: no      MEDICATIONS ORDERED IN ED: Medications  levETIRAcetam (KEPPRA) tablet 500 mg (500 mg Oral Given 03/25/23 1048)   lactated ringers infusion ( Intravenous New Bag/Given 03/24/23 2148)  ondansetron (ZOFRAN) injection 4 mg (has no administration in time range)  acetaminophen (TYLENOL) tablet 650 mg (has no administration in time range)  LORazepam (ATIVAN) injection 2 mg (has no administration in time range)  meropenem (MERREM) 1 g in sodium chloride  0.9 % 100 mL IVPB (1 g Intravenous New Bag/Given 03/25/23 1056)  enoxaparin (LOVENOX) injection 47.5 mg (47.5 mg Subcutaneous Given 03/25/23 0158)  lipase/protease/amylase (CREON) capsule 72,000 Units (72,000 Units Oral Patient Refused/Not Given 03/25/23 1108)    And  lipase/protease/amylase (CREON) capsule 36,000 Units (36,000 Units Oral Patient Refused/Not Given 03/25/23 1109)  pantoprazole (PROTONIX) EC tablet 40 mg (40 mg Oral Given 03/25/23 1048)  oxybutynin (DITROPAN-XL) 24 hr tablet 10 mg (10 mg Oral Given 03/25/23 1049)  amantadine (SYMMETREL) capsule 100 mg (100 mg Oral Given 03/25/23 1048)  baclofen (LIORESAL) tablet 10-20 mg (20 mg Oral Given 03/25/23 1048)  tiZANidine (ZANAFLEX) tablet 4 mg (4 mg Oral Given 03/25/23 0158)  multivitamin with minerals tablet 1 tablet (1 tablet Oral Given 03/25/23 1048)  loratadine (CLARITIN) tablet 10 mg (10 mg Oral Given 03/25/23 1049)  nystatin (MYCOSTATIN/NYSTOP) topical powder 1 Application (has no administration in time range)  Interferon Beta-1a SOAJ 44 mcg (44 mcg Subcutaneous Not Given 03/25/23 0326)  polyethylene glycol (MIRALAX / GLYCOLAX) packet 17 g (has no administration in time range)  lactated ringers bolus 1,000 mL (0 mLs Intravenous Stopped 03/24/23 1430)  ceFEPIme (MAXIPIME) 2 g in sodium chloride 0.9 % 100 mL IVPB (0 g Intravenous Stopped 03/24/23 1419)  metroNIDAZOLE (FLAGYL) IVPB 500 mg (0 mg Intravenous Stopped 03/24/23 1452)  vancomycin (VANCOCIN) IVPB 1000 mg/200 mL premix (0 mg Intravenous Stopped 03/24/23 1630)  iohexol (OMNIPAQUE) 300 MG/ML solution 100 mL (100 mLs Intravenous Contrast Given 03/24/23 1512)   acetaminophen (TYLENOL) tablet 1,000 mg (1,000 mg Oral Given 03/24/23 1831)  lactated ringers bolus 1,000 mL (1,000 mLs Intravenous New Bag/Given 03/24/23 2039)  ibuprofen (ADVIL) tablet 600 mg (600 mg Oral Given 03/24/23 2034)     IMPRESSION / MDM / ASSESSMENT AND PLAN / ED COURSE  I reviewed the triage vital signs and the nursing notes.                              Differential diagnosis includes, but is not limited to, acute cystitis, pyelonephritis, bladder rupture, peritonitis, nephrolithiasis, sepsis  Patient's presentation is most consistent with acute presentation with potential threat to life or bodily function.  Patient is a 63 year old female presenting today for lower abdominal pain associate with fever and tachycardia.  She is meeting sepsis criteria on arrival and was started on broad-spectrum antibiotics given recent procedure yesterday with suprapubic catheter placement.  Blood cultures obtained and lactic acid obtained.  Patient given 1 L fluids as well as she was not currently in a shock state.  Lactic acid normal.  Leukocytosis present.  UA strongly indicative of UTI.  Given severe abdominal pain symptoms with the recent procedure, CT abdomen/pelvis was ordered for further evaluation to exclude additional infection outside of her urine.  Patient was signed out to oncoming provider pending CT and then admission for sepsis.  The patient is on the cardiac monitor to evaluate for evidence of arrhythmia and/or significant heart rate changes.     FINAL CLINICAL IMPRESSION(S) / ED DIAGNOSES   Final diagnoses:  Pyelonephritis  Sepsis, due to unspecified organism, unspecified whether acute organ dysfunction present [facility])     Rx / DC Orders   ED Discharge Orders     None        Note:  This document was prepared using Dragon voice recognition software and may include unintentional dictation errors.   Janith Lima, MD 03/25/23 1154

## 2023-03-24 NOTE — ED Triage Notes (Addendum)
"  Suprapubic catheter initial insertion yesterday, Husband said it took them two attempts due to balloon bursting on first one. Prior to yesterday she had foley.l  Woke up this morning not feeling well, some abdominal pain and noticed blood in her catheter bag.  Temp 100.3, patient said at home it was 103" per EMS

## 2023-03-24 NOTE — Sepsis Progress Note (Signed)
Code Sepsis protocol being monitored by eLink.

## 2023-03-24 NOTE — Progress Notes (Signed)
CODE SEPSIS - PHARMACY COMMUNICATION  **Broad Spectrum Antibiotics should be administered within 1 hour of Sepsis diagnosis**  Time Code Sepsis Called/Page Received: 1310  Antibiotics Ordered: vancomycin, cefepime, metronidazole  Time of 1st antibiotic administration: 1343  Additional action taken by pharmacy: None  If necessary, Name of Provider/Nurse Contacted: None    Rebecca [name] ,PharmD Clinical Pharmacist  03/24/2023  1:25 PM

## 2023-03-24 NOTE — ED Provider Notes (Signed)
CT concerning for pyelo. Discussed with Dr. Clyde Lundborg with the hospitalist service who will evaluate for admission.   Phineas Semen, MD 03/24/23 2035

## 2023-03-24 NOTE — Telephone Encounter (Signed)
  Chief Complaint: severe abd pain and feeling hot after having a suprapubic catheter inserted yesterday. Symptoms: abd pain and no urine output in the bag.   "It just looks dark but there is no urine in it".    He was not given the surgeon's name or number upon discharge.   I have looked all through my papers and there is no dr name or number.    [facility] Frequency: Since early this morning Pertinent Negatives: Patient denies having urine in the bag. Disposition: [x] ED /[] Urgent Care (no appt availability in office) / [] Appointment(In office/virtual)/ []  [facility]/ [] Home Care/ [] Refused Recommended Disposition /[] [facility]/ []  Follow-up with PCP Additional Notes: I have referred her to the ED.  Husband very agreeable to calling 911 now.

## 2023-03-24 NOTE — Progress Notes (Signed)
PHARMACIST - PHYSICIAN COMMUNICATION  CONCERNING:  Enoxaparin (Lovenox) for DVT Prophylaxis    RECOMMENDATION: Patient was prescribed enoxaprin 40mg  q24 hours for VTE prophylaxis.   Filed Weights   03/24/23 1301  Weight: 94.3 kg (208 lb)    Body mass index is 35.7 kg/m.  Estimated Creatinine Clearance: 81.1 mL/min (by C-G formula based on SCr of 0.57 mg/dL).   Based on [facility] policy patient is candidate for enoxaparin 0.5mg /kg TBW SQ every 24 hours based on BMI being >30.  DESCRIPTION: Pharmacy has adjusted enoxaparin dose per [facility] policy.  Patient is now receiving enoxaparin 47.5 mg every 24 hours    Foye Deer, PharmD Clinical Pharmacist  03/24/2023 6:58 PM

## 2023-03-24 NOTE — H&P (Signed)
History and Physical    Rebecca [name]:[id number] DOB: Jun 23, 1960 DOA: 03/24/2023  Referring MD/NP/PA:   PCP: Danelle Berry, PA-C   Patient coming from:  The patient is coming from home.     Chief Complaint: Abdominal pain, hematuria, fever, chills,  HPI: Rebecca [name] is a 63 y.o. female with medical history significant of  MS, spasmatic paraplegia, bed-bound  neurogenic bladder, diastolic CHF, cardiac, chronic pancreatic insufficiency, lymphedema, breast cancer (s/p of right mastectomy), endocarditis, seizure, s/p of suprapubic catheter placement on 2/13, who presents with abdominal pain, hematuri, fever, chills  Patient had suprapubic catheter placed yesterday 2/13.  This morning, she developed abdominal pain, which is located in the lower abdomen, constant, sharp, moderate, nonradiating.  She noticed blood in her urine.  No nausea, vomiting, diarrhea. She has a fever of 103.1, and chills.  Denies chest pain, SOB.     Data reviewed independently and ED Course: pt was found to have WBC 15.6, lactic acid 1.1, positive urinalysis (turbid appearance, many bacteria, WBC> 50), negative PCR for COVID, flu and RSV, GFR> 60.  Chest x-ray negative.  Patient is admitted to PCU as inpatient.  CT abdomen/pelvis: 1. Bilateral pyelonephritis and cystitis. 2. Constipation. 3. Small to moderate volume hiatal hernia.     EKG: I have personally reviewed.  Sinus rhythm, QTc 439, poor R wave progression   Review of Systems:   General: has fevers, chills, no body weight gain, has fatigue HEENT: no blurry vision, hearing changes or sore throat Respiratory: no dyspnea, coughing, wheezing CV: no chest pain, no palpitations GI: no nausea, vomiting, has lower abdominal pain, no diarrhea, constipation GU: no dysuria, burning on urination, increased urinary frequency, has hematuria  Ext: no leg edema Neuro: no unilateral weakness, numbness, or tingling, no vision change or hearing loss. Has  paraplegia Skin: no rash, no skin tear. MSK: No muscle spasm, no deformity Heme: No easy bruising.  Travel history: No recent long distant travel.   Allergy:  Allergies  Allergen Reactions   Fentanyl Nausea And Vomiting and Nausea Only    vomiting Was given in PACU x3 each time patient got sick    Sulfa Antibiotics Hives and Other (See Comments)    Light headed, over heated    Past Medical History:  Diagnosis Date   Allergy    Aortic valve disease    Mild AS / AI - most recent Echo demonstrated tricuspid aortic valve.   Bacterial endocarditis    History of .   Bilateral impacted cerumen 06/29/2021   Bilateral lower extremity edema    Noncardiac.  Chronic. LE Venous dopplers - negative for DVT.; Echocardiogram January 2016: Normal EF with normal wall motion and valve function. Only grade 1 diastolic dysfunction. EF 60-65%. Mild MR   Breast cancer (HCC) 12/31/2013   Right breast, 12:00, 1.5 cm, T1c,N0 invasive mammary carcinoma, triple negative. --> Rx with Chemo   Cervical stenosis of spine    GERD (gastroesophageal reflux disease) April or May 2022   Heart murmur 2013   Herpes zoster    IBS (irritable bowel syndrome)    Lymphedema    has legs wrapped at [facility]   Multiple sclerosis (HCC) 2001   Walks from room to room @ home; but Wheelchair when going out.   Neuromuscular disorder (HCC)    MS   PONV (postoperative nausea and vomiting)    Related to Fentanyl   Seizures (HCC)    Takes Keppra   SOB (shortness of breath)  03/01/2013   Syncope and collapse    Urinary tract infection associated with indwelling urethral catheter (HCC) 02/17/2020    Past Surgical History:  Procedure Laterality Date   ANKLE SURGERY     Left   ANKLE SURGERY     ANTERIOR CERVICAL DECOMP/DISCECTOMY FUSION  11/17/2011   Procedure: ANTERIOR CERVICAL DECOMPRESSION/DISCECTOMY FUSION 2 LEVELS;  Surgeon: Maeola Harman, MD;  Location: [facility];  Service: Neurosurgery;  Laterality: N/A;  Cervical  Five-Six Six-Seven Anterior cervical decompression/diskectomy/fusion   BREAST BIOPSY Right 12/31/2013   invasive mammary   BREAST SURGERY Right 02/03/2014   Right simple mastectomy with sentinel node biopsy.   CHOLECYSTECTOMY     COLONOSCOPY  2014   ESOPHAGOGASTRODUODENOSCOPY (EGD) WITH PROPOFOL N/A 07/16/2020   Procedure: ESOPHAGOGASTRODUODENOSCOPY (EGD) WITH PROPOFOL;  Surgeon: Pasty Spillers, MD;  Location: [facility] ENDOSCOPY;  Service: Endoscopy;  Laterality: N/A;  Patient has MS and will need assistance   HYSTEROSCOPY WITH D & C N/A 11/27/2018   Procedure: DILATATION AND CURETTAGE /HYSTEROSCOPY;  Surgeon: Nadara Mustard, MD;  Location: [facility] ORS;  Service: Gynecology;  Laterality: N/A;   IR ASPIRATION BLADDER INSERT SUPRAPUBIC CATHETER  03/23/2023   Lower extremity venous Dopplers  02/27/2013   No LE DVT   MASTECTOMY Right 2015   ORIF TIBIA PLATEAU Left 12/09/2020   Procedure: LEFT OPEN REDUCTION INTERNAL FIXATION (ORIF) TIBIAL PLATEAU;  Surgeon: Roby Lofts, MD;  Location: [facility] OR;  Service: Orthopedics;  Laterality: Left;   Port a cath insertion Right 01/19/2010   PORT-A-CATH REMOVAL     right   PORT-A-CATH REMOVAL Right 09/03/2013   Procedure: REMOVAL PORT-A-CATH;  Surgeon: Fransisco Hertz, MD;  Location: [facility] OR;  Service: Vascular;  Laterality: Right;   SPINE SURGERY  Cervical steinois Dr. Venetia Maxon 2014 I think?   TONSILLECTOMY     TRANSTHORACIC ECHOCARDIOGRAM  03/2013; 02/2014   a) Normal LV size and function with EF 60-65%.; Cannot exclude bicuspid aortic valve with mild AS and mild AI.; b) Normal EF with normal wall motion and valve function x Mild MR. G2 DD. EF 60-65%. Tricuspid AoV   UPPER GI ENDOSCOPY  2014    Social History:  reports that she has never smoked. She has never used smokeless tobacco. She reports that she does not drink alcohol and does not use drugs.  Family History:  Family History  Problem Relation Age of Onset   Cancer Father        skin   Heart disease  Father    Heart attack Father        heart attack in his 55's   Thyroid disease Sister    Ovarian cancer Cousin    Breast cancer Maternal Aunt 58   Breast cancer Maternal Grandmother 62   Bladder Cancer Neg Hx    Kidney cancer Neg Hx      Prior to Admission medications   Medication Sig Start Date End Date Taking? Authorizing Provider  amantadine (SYMMETREL) 100 MG capsule TAKE 1 CAPSULE BY MOUTH THREE TIMES A DAY 10/31/22   Penumalli, Glenford Bayley, MD  baclofen (LIORESAL) 10 MG tablet Take 1-2 tablets (10-20 mg total) by mouth 3 (three) times daily. 08/15/22   Penumalli, Glenford Bayley, MD  gluconic acid-citric acid (RENACIDIN) irrigation Insert 30 mL into suprapubic tube and clamp tube for 10 minutes and then drain twice daily 09/09/22   McGowan, Carollee Herter A, PA-C  Glucosamine-Chondroitin (COSAMIN DS PO) Take 1 tablet by mouth 3 (three) times daily.  [provider]  levETIRAcetam (KEPPRA) 500 MG tablet Take 1 tablet (500 mg total) by mouth 2 (two) times daily. 08/15/22   Penumalli, Glenford Bayley, MD  lipase/protease/amylase (CREON) 36000 UNITS CPEP capsule Take 2 capsules (72,000 Units total) by mouth 3 (three) times daily with meals AND 1 capsule (36,000 Units total) with snacks. Patient taking differently: Take 1-2 capsules (36,000- 72,000 Units total) by mouth with (three) times daily with meals AND 1 capsule (36,000 Units total) with snacks. (1 capsule by mouth with breakfast, 2 capsules by mouth with lunch and 1 capsule by mouth with dinner)   08/25/22 08/20/23  Celso Amy, PA-C  loratadine (CLARITIN) 10 MG tablet Take 10 mg by mouth daily.    [provider]  Misc Natural Products (LEG VEIN & CIRCULATION) TABS Take 1 tablet by mouth 2 (two) times daily.     [provider]  Multiple Vitamins-Minerals (MULTIVITAMIN PO) Take 1 tablet by mouth daily.     [provider]  nystatin (MYCOSTATIN/NYSTOP) powder Apply 1 application topically 3 (three) times daily as needed. For  rash/raw skin as needed 07/31/19   Danelle Berry, PA-C  omeprazole (PRILOSEC) 40 MG capsule TAKE 1 CAPSULE (40 MG TOTAL) BY MOUTH DAILY. 01/16/23   Danelle Berry, PA-C  oxybutynin (DITROPAN-XL) 10 MG 24 hr tablet Take 1 tablet (10 mg total) by mouth 2 (two) times daily. 01/16/23   McGowan, Carollee Herter A, PA-C  REBIF REBIDOSE 44 MCG/0.5ML SOAJ INJECT 1 PEN UNDER THE SKIN 3 TIMES PER WEEK 10/06/22   Penumalli, Glenford Bayley, MD  tiZANidine (ZANAFLEX) 4 MG tablet Take 1 tablet (4 mg total) by mouth at bedtime. 08/15/22   Penumalli, Glenford Bayley, MD  Turmeric 500 MG CAPS Take 500 mg by mouth daily.    [provider]    Physical Exam: Vitals:   03/24/23 2000 03/24/23 2032 03/24/23 2207 03/25/23 0117  BP: (!) 111/56  (!) 91/51 106/68  Pulse: (!) 115  98 88  Resp: (!) 23  16 15   Temp:  98.4 F (36.9 C)  98 F (36.7 C)  TempSrc:  Oral    SpO2: 97%  99% 97%  Weight:      Height:       General: Not in acute distress HEENT:       Eyes: PERRL, EOMI, no jaundice       ENT: No discharge from the ears and nose, no pharynx injection, no tonsillar enlargement.        Neck: No JVD, no bruit, no mass felt. Heme: No neck lymph node enlargement. Cardiac: S1/S2, RRR, No murmurs, No gallops or rubs. Respiratory: No rales, wheezing, rhonchi or rubs. GI: Soft, nondistended, has tenderness in lower abdomen, no rebound pain, no organomegaly, BS present. GU: No hematuria Ext: Has chronic lymphedema in both legs Musculoskeletal: No joint deformities, No joint redness or warmth, no limitation of ROM in spin. Skin: No rashes.  Neuro: Alert, oriented X3, cranial nerves II-XII grossly intact, moves all extremities normally. Has spasmatic paraplegia,  Psych: Patient is not psychotic, no suicidal or hemocidal ideation.  Labs on Admission: I have personally reviewed following labs and imaging studies  CBC: Recent Labs  Lab 03/22/23 1011 03/23/23 1153 03/24/23 1317  WBC 4.4 4.4 15.6*  NEUTROABS 2.4  --  14.3*  HGB  11.4* 11.9* 12.6  HCT 35.5* 36.8 38.9  MCV 89.9 89.3 89.6  PLT 224 209 166   Basic Metabolic Panel: Recent Labs  Lab 03/24/23 1317  NA  134*  K 3.5  CL 100  CO2 23  GLUCOSE 151*  BUN 14  CREATININE 0.57  CALCIUM 8.9   GFR: Estimated Creatinine Clearance: 81.1 mL/min (by C-G formula based on SCr of 0.57 mg/dL). Liver Function Tests: Recent Labs  Lab 03/24/23 1317  AST 30  ALT 32  ALKPHOS 95  BILITOT 1.0  PROT 7.5  ALBUMIN 3.1*   No results for input(s): "LIPASE", "AMYLASE" in the last 168 hours. No results for input(s): "AMMONIA" in the last 168 hours. Coagulation Profile: Recent Labs  Lab 03/23/23 1153 03/24/23 1317  INR 1.0 1.2   Cardiac Enzymes: No results for input(s): "CKTOTAL", "CKMB", "CKMBINDEX", "TROPONINI" in the last 168 hours. BNP (last 3 results) No results for input(s): "PROBNP" in the last 8760 hours. HbA1C: No results for input(s): "HGBA1C" in the last 72 hours. CBG: No results for input(s): "GLUCAP" in the last 168 hours. Lipid Profile: No results for input(s): "CHOL", "HDL", "LDLCALC", "TRIG", "CHOLHDL", "LDLDIRECT" in the last 72 hours. Thyroid Function Tests: No results for input(s): "TSH", "T4TOTAL", "FREET4", "T3FREE", "THYROIDAB" in the last 72 hours. Anemia Panel: No results for input(s): "VITAMINB12", "FOLATE", "FERRITIN", "TIBC", "IRON", "RETICCTPCT" in the last 72 hours. Urine analysis:    Component Value Date/Time   COLORURINE RED (A) 03/24/2023 1330   APPEARANCEUR TURBID (A) 03/24/2023 1330   APPEARANCEUR Clear 04/03/2017 1409   LABSPEC  03/24/2023 1330    TEST NOT REPORTED DUE TO COLOR INTERFERENCE OF URINE PIGMENT   LABSPEC 1.014 02/28/2011 1534   PHURINE  03/24/2023 1330    TEST NOT REPORTED DUE TO COLOR INTERFERENCE OF URINE PIGMENT   GLUCOSEU (A) 03/24/2023 1330    TEST NOT REPORTED DUE TO COLOR INTERFERENCE OF URINE PIGMENT   GLUCOSEU Negative 02/28/2011 1534   HGBUR (A) 03/24/2023 1330    TEST NOT REPORTED DUE TO  COLOR INTERFERENCE OF URINE PIGMENT   BILIRUBINUR (A) 03/24/2023 1330    TEST NOT REPORTED DUE TO COLOR INTERFERENCE OF URINE PIGMENT   BILIRUBINUR negative 07/16/2018 1453   BILIRUBINUR Negative 04/03/2017 1409   BILIRUBINUR Negative 02/28/2011 1534   KETONESUR (A) 03/24/2023 1330    TEST NOT REPORTED DUE TO COLOR INTERFERENCE OF URINE PIGMENT   PROTEINUR (A) 03/24/2023 1330    TEST NOT REPORTED DUE TO COLOR INTERFERENCE OF URINE PIGMENT   UROBILINOGEN negative (A) 07/16/2018 1453   NITRITE (A) 03/24/2023 1330    TEST NOT REPORTED DUE TO COLOR INTERFERENCE OF URINE PIGMENT   LEUKOCYTESUR (A) 03/24/2023 1330    TEST NOT REPORTED DUE TO COLOR INTERFERENCE OF URINE PIGMENT   LEUKOCYTESUR Trace 02/28/2011 1534   Sepsis Labs: @LABRCNTIP (procalcitonin:4,lacticidven:4) ) Recent Results (from the past 240 hours)  Resp panel by RT-PCR (RSV, Flu A&B, Covid) Anterior Nasal Swab     Status: None   Collection Time: 03/24/23  1:30 PM   Specimen: Anterior Nasal Swab  Result Value Ref Range Status   SARS Coronavirus 2 by RT PCR NEGATIVE NEGATIVE Final    Comment: (NOTE) SARS-CoV-2 target nucleic acids are NOT DETECTED.  The SARS-CoV-2 RNA is generally detectable in upper respiratory specimens during the acute phase of infection. The lowest concentration of SARS-CoV-2 viral copies this assay can detect is 138 copies/mL. A negative result does not preclude SARS-Cov-2 infection and should not be used as the sole basis for treatment or other patient management decisions. A negative result may occur with  improper specimen collection/handling, submission of specimen other than nasopharyngeal swab, presence of viral mutation(s)  within the areas targeted by this assay, and inadequate number of viral copies(<138 copies/mL). A negative result must be combined with clinical observations, patient history, and epidemiological information. The expected result is Negative.  Fact Sheet for Patients:   BloggerCourse.com  Fact Sheet for Healthcare Providers:  SeriousBroker.it  This test is no t yet approved or cleared by the [place] FDA and  has been authorized for detection and/or diagnosis of SARS-CoV-2 by FDA under an Emergency Use Authorization (EUA). This EUA will remain  in effect (meaning this test can be used) for the duration of the COVID-19 declaration under Section 564(b)(1) of the Act, 21 U.S.C.section 360bbb-3(b)(1), unless the authorization is terminated  or revoked sooner.       Influenza A by PCR NEGATIVE NEGATIVE Final   Influenza B by PCR NEGATIVE NEGATIVE Final    Comment: (NOTE) The Xpert Xpress SARS-CoV-2/FLU/RSV plus assay is intended as an aid in the diagnosis of influenza from Nasopharyngeal swab specimens and should not be used as a sole basis for treatment. Nasal washings and aspirates are unacceptable for Xpert Xpress SARS-CoV-2/FLU/RSV testing.  Fact Sheet for Patients: BloggerCourse.com  Fact Sheet for Healthcare Providers: SeriousBroker.it  This test is not yet approved or cleared by the [place] FDA and has been authorized for detection and/or diagnosis of SARS-CoV-2 by FDA under an Emergency Use Authorization (EUA). This EUA will remain in effect (meaning this test can be used) for the duration of the COVID-19 declaration under Section 564(b)(1) of the Act, 21 U.S.C. section 360bbb-3(b)(1), unless the authorization is terminated or revoked.     Resp Syncytial Virus by PCR NEGATIVE NEGATIVE Final    Comment: (NOTE) Fact Sheet for Patients: BloggerCourse.com  Fact Sheet for Healthcare Providers: SeriousBroker.it  This test is not yet approved or cleared by the [place] FDA and has been authorized for detection and/or diagnosis of SARS-CoV-2 by FDA under an Emergency Use  Authorization (EUA). This EUA will remain in effect (meaning this test can be used) for the duration of the COVID-19 declaration under Section 564(b)(1) of the Act, 21 U.S.C. section 360bbb-3(b)(1), unless the authorization is terminated or revoked.  Performed at [facility], [address] [place], [place] [address]      Radiological Exams on Admission:   Assessment/Plan Principal Problem:   Complicated UTI (urinary tract infection) Active Problems:   Acute pyelonephritis   Sepsis (HCC)   Seizure disorder (HCC)   MS (multiple sclerosis) (HCC)   Neurogenic bladder   Pancreatic insufficiency   Chronic diastolic CHF (congestive heart failure) (HCC)   Obesity (BMI 30-39.9)   Assessment and Plan:   Sepsis due to  complicated UTI (urinary tract infection) and acute pyelonephritis: pt has sepsis with WBC 15.6, fever of 103.1, heart rate up to 129, RR 31.  Lactic acid normal 1.1.  Currently hemodynamically stable.  Patient received 1 dose of vancomycin, cefepime and Flagyl in ED.  Patient has history of ESBL.  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  Patient has chronic lymphedema in both legs, but  denies shortness of breath, no pulm edema on chest x-ray, does not seem to have CHF exacerbation.   -May need to start diuretics after sepsis resolved -Check BNP   Obesity (BMI 30-39.9): Body weight 94.3 kg, BMI 35.70 -Encourage losing weight -Exercise and healthy diet       DVT ppx:  SQ Lovenox  Code Status: Full code    Family Communication:     not done, no family member is at bed side.     Disposition Plan:  Anticipate discharge back to previous environment  Consults called:  none  Admission status and Level of care: Progressive:  as inpt        Dispo: The patient is from: Home              Anticipated d/c is to: Home              Anticipated d/c date is: 2 days              Patient currently is not medically stable to d/c.    Severity of Illness:  The appropriate patient status for this patient is INPATIENT. Inpatient status is judged to be reasonable and necessary in order to provide the required intensity of service to ensure the patient's safety. The patient's presenting symptoms, physical exam findings, and initial radiographic and laboratory data in the context of their chronic comorbidities is felt to place them at high risk for further clinical deterioration. Furthermore, it is not anticipated that the patient will be medically stable for discharge from the hospital within 2 midnights of admission.   * I certify that at the point of admission it is my clinical judgment that the patient will require inpatient hospital care spanning beyond 2 midnights from the point of admission due to high intensity of service, high risk for further deterioration and high frequency of surveillance required.*       Date of Service 03/25/2023    Lorretta Harp [facility]   If 7PM-7AM, please contact night-coverage [email or web address] 03/25/2023, 2:50 AM

## 2023-03-24 NOTE — Progress Notes (Signed)
Pharmacy Antibiotic Note  Rebecca [name] is a 63 y.o. female admitted on 03/24/2023 with  Complicated UTI, s/p of suprapubic catheter placement, history of ESBL.  Pharmacy has been consulted for Meropenem dosing.  -Hx of seizures, IBS, GERD, breast cancer s/p chemo, and multiple sclerosis presenting d/t neurogenic bladder with chronic foley.  Patient was recently discharged from [facility] on 03/13/23 for sepsis d/t multifocal bilateral pyelonephritis.  -suprapubic catheter inserted yesterday 03/23/23  Plan: Meropenem 1 gm IV q8h  Follow renal fxn, cultures, length of therapy   Height: 5\' 4"  (162.6 cm) Weight: 94.3 kg (208 lb) IBW/kg (Calculated) : 54.7  Temp (24hrs), Avg:100.9 F (38.3 C), Min:98.6 F (37 C), Max:103.1 F (39.5 C)  Recent Labs  Lab 03/22/23 1011 03/23/23 1153 03/24/23 1317  WBC 4.4 4.4 15.6*  CREATININE  --   --  0.57  LATICACIDVEN  --   --  1.1    Estimated Creatinine Clearance: 81.1 mL/min (by C-G formula based on SCr of 0.57 mg/dL).    Allergies  Allergen Reactions   Fentanyl Nausea And Vomiting and Nausea Only    vomiting Was given in PACU x3 each time patient got sick    Sulfa Antibiotics Hives and Other (See Comments)    Light headed, over heated    Antimicrobials this admission: Cefepime, metronidazole, vancomycin x 1 each in ED 2/14  Meropenem  2/14 >>    Dose adjustments this admission:    Microbiology results: 2/14 BCx: pending 2/14 UCx: pending    Sputum:      MRSA PCR:    Thank you for allowing pharmacy to be a part of this patient's care.  Bari Mantis PharmD Clinical Pharmacist 03/24/2023

## 2023-03-24 NOTE — Telephone Encounter (Signed)
Reason for Disposition  Sounds like a life-threatening emergency to the triager  Answer Assessment - Initial Assessment Questions 1. LOCATION: "Where does it hurt?"      Husband calling into community line. She had a suprapubic catheter done yesterday.   [facility].  They said something about the balloon broke and they had to get another one.   It took extra time to do it.  It's hurting and she is super hot.   Her fever via forehead is 103.3      I don't have the surgeon's number when I asked if they had contacted the surgeon.   He doesn't have that information.  2. RADIATION: "Does the pain shoot anywhere else?" (e.g., chest, back)     Her stomach is hurting and she is hot.     3. ONSET: "When did the pain begin?" (e.g., minutes, hours or days ago)      Early this morning around 5:00 AM.   She falls asleep and waking up. They didn't prescribe her any pain medicine.   The bag is dark but there is no urine in it.   It's the same as yesterday.   It's not red but brown looking the bag.   I don't see any urine.   She has a broken leg so she can't walk.   I had to call EMS yesterday for her.   I instructed him to call 911 now because if there was no urine in the bag and it was inserted yesterday that urine is backing up some place because her kidneys are still producing urine so she should have some in the bag.   He was agreeable to calling right now. 4. SUDDEN: "Gradual or sudden onset?"     Gradually  5. PATTERN "Does the pain come and go, or is it constant?"    - If it comes and goes: "How long does it last?" "Do you have pain now?"     (Note: Comes and goes means the pain is intermittent. It goes away completely between bouts.)    - If constant: "Is it getting better, staying the same, or getting worse?"      (Note: Constant means the pain never goes away completely; most serious pain is constant and gets worse.)      Severe pain around in her abd. 6.  SEVERITY: "How bad is the pain?"  (e.g., Scale 1-10; mild, moderate, or severe)    - MILD (1-3): Doesn't interfere with normal activities, abdomen soft and not tender to touch.     - MODERATE (4-7): Interferes with normal activities or awakens from sleep, abdomen tender to touch.     - SEVERE (8-10): Excruciating pain, doubled over, unable to do any normal activities.       Severe abd pain and feeling very hot. 7. RECURRENT SYMPTOM: "Have you ever had this type of stomach pain before?" If Yes, ask: "When was the last time?" and "What happened that time?"      Not asked 8. CAUSE: "What do you think is causing the stomach pain?"     I let husband know it sounds like the suprapubic catheter is clogged and the urine is not able to drain into the bag.    9. RELIEVING/AGGRAVATING FACTORS: "What makes it better or worse?" (e.g., antacids, bending or twisting motion, bowel movement)     Nothing 10. OTHER SYMPTOMS: "Do you have any other symptoms?" (e.g., back  pain, diarrhea, fever, urination pain, vomiting)       No urine in the suprapubic catheter bag 11. PREGNANCY: "Is there any chance you are pregnant?" "When was your last menstrual period?"       N/A  Protocols used: Abdominal Pain - [facility]

## 2023-03-24 NOTE — Progress Notes (Signed)
PHARMACIST - PHYSICIAN ORDER COMMUNICATION  CONCERNING: P&T Medication Policy on Herbal Medications  DESCRIPTION:  This patient's order for:  Tumeric, Glucosamine-Chondroitin, and Leg Vein & Circulation Tabs  has been noted.  This product(s) is classified as an "herbal" or natural product. Due to a lack of definitive safety studies or FDA approval, nonstandard manufacturing practices, plus the potential risk of unknown drug-drug interactions while on inpatient medications, the Pharmacy and Therapeutics Committee does not permit the use of "herbal" or natural products of this type within [facility].   ACTION TAKEN: The pharmacy department is unable to verify this order at this time. Please reevaluate patient's clinical condition at discharge and address if the herbal or natural product(s) should be resumed at that time.  Clovia Cuff, PharmD, BCPS 03/24/2023 8:03 PM

## 2023-03-24 NOTE — ED Notes (Signed)
PT given phone to call husband with update.

## 2023-03-25 DIAGNOSIS — N390 Urinary tract infection, site not specified: Secondary | ICD-10-CM | POA: Diagnosis not present

## 2023-03-25 LAB — BASIC METABOLIC PANEL
Anion gap: 7 (ref 5–15)
BUN: 14 mg/dL (ref 8–23)
CO2: 21 mmol/L — ABNORMAL LOW (ref 22–32)
Calcium: 8.5 mg/dL — ABNORMAL LOW (ref 8.9–10.3)
Chloride: 105 mmol/L (ref 98–111)
Creatinine, Ser: 0.77 mg/dL (ref 0.44–1.00)
GFR, Estimated: >60 mL/min (ref >60)
Glucose, Bld: 117 mg/dL — ABNORMAL HIGH (ref 70–99)
Potassium: 3.4 mmol/L — ABNORMAL LOW (ref 3.5–5.1)
Sodium: 133 mmol/L — ABNORMAL LOW (ref 135–145)

## 2023-03-25 LAB — CBC
HCT: 32.1 % — ABNORMAL LOW (ref 36.0–46.0)
Hemoglobin: 10.6 g/dL — ABNORMAL LOW (ref 12.0–15.0)
MCH: 29.5 pg (ref 26.0–34.0)
MCHC: 33 g/dL (ref 30.0–36.0)
MCV: 89.4 fL (ref 80.0–100.0)
Platelets: 116 10*3/uL — ABNORMAL LOW (ref 150–400)
RBC: 3.59 MIL/uL — ABNORMAL LOW (ref 3.87–5.11)
RDW: 14.3 % (ref 11.5–15.5)
WBC: 12.7 10*3/uL — ABNORMAL HIGH (ref 4.0–10.5)
nRBC: 0 % (ref 0.0–0.2)

## 2023-03-25 LAB — GLUCOSE, CAPILLARY: Glucose-Capillary: 136 mg/dL — ABNORMAL HIGH (ref 70–99)

## 2023-03-25 MED ORDER — POTASSIUM CHLORIDE CRYS ER 20 MEQ PO TBCR
40.0000 meq | EXTENDED_RELEASE_TABLET | Freq: Once | ORAL | Status: AC
  Administered 2023-03-25: 40 meq via ORAL
  Filled 2023-03-25: qty 2

## 2023-03-25 MED ORDER — INTERFERON BETA-1A 44 MCG/0.5ML ~~LOC~~ SOAJ
44.0000 ug | SUBCUTANEOUS | Status: DC
  Filled 2023-03-25: qty 1

## 2023-03-25 MED ORDER — POLYETHYLENE GLYCOL 3350 17 G PO PACK: 17.0000 g | PACK | Freq: Every day | ORAL | Status: DC | PRN

## 2023-03-25 NOTE — Plan of Care (Signed)

## 2023-03-25 NOTE — Progress Notes (Signed)
Home medication given back to family. Per provider, will hold for now due to infection. Patient and husband verbalized understanding.

## 2023-03-25 NOTE — Progress Notes (Addendum)
Progress Note    Rebecca [name]  ZOX:[id number] DOB: [birth year]  DOA: 03/24/2023 PCP: Danelle Berry, PA-C      Brief Narrative:    Medical records reviewed and are as summarized below:  Rebecca [name] is a 63 y.o. female  with medical history significant of  MS, spasmatic paraplegia, bed-bound  neurogenic bladder, diastolic CHF, cardiac, chronic pancreatic insufficiency, lymphedema, breast cancer (s/p of right mastectomy), endocarditis, seizure, s/p of suprapubic catheter placement on 2/13.  She presented to the hospital with abdominal pain, fever, chills and bloody urine.      Assessment/Plan:   Principal Problem:   Complicated UTI (urinary tract infection) Active Problems:   Acute pyelonephritis   Sepsis (HCC)   Seizure disorder (HCC)   MS (multiple sclerosis) (HCC)   Neurogenic bladder   Pancreatic insufficiency   Chronic diastolic CHF (congestive heart failure) (HCC)   Obesity (BMI 30-39.9)    Body mass index is 35.7 kg/m.   Sepsis secondary to complicated UTI, acute pyelonephritis: Continue IV meropenem.  Discontinue IV fluids.  Follow-up urine and blood cultures.   Hypokalemia: Replete potassium and monitor levels Hyponatremia: Sodium level is trending down.  Monitor BMP.   Seizure disorder: Continue Keppra 500 mg twice daily.  Use IV Ativan as needed for seizures.  Seizure precautions.   Multiple sclerosis, spasmodic paraparesis: Continue amantadine, baclofen and tizanidine.  Hold interferon beta because of active infection.   Neurogenic bladder: S/p suprapubic catheter placement on 03/23/2023 (prior to admission)   Comorbidities include compensated chronic diastolic CHF, pancreatic insufficiency on Creon   Diet Order             Diet Heart Fluid consistency: Thin  Diet effective now                            Consultants: None  Procedures: None    Medications:    amantadine  100 mg Oral TID   baclofen  10-20 mg Oral  TID   enoxaparin (LOVENOX) injection  0.5 mg/kg Subcutaneous Q24H   Interferon Beta-1a  44 mcg Subcutaneous Once per day on Monday Wednesday Friday   levETIRAcetam  500 mg Oral BID   lipase/protease/amylase  72,000 Units Oral TID AC   And   lipase/protease/amylase  36,000 Units Oral TID AC   loratadine  10 mg Oral Daily   multivitamin with minerals  1 tablet Oral Daily   oxybutynin  10 mg Oral BID   pantoprazole  40 mg Oral Daily   potassium chloride  40 mEq Oral Once   tiZANidine  4 mg Oral QHS   Continuous Infusions:  meropenem (MERREM) IV 1 g (03/25/23 1056)     Anti-infectives (From admission, onward)    Start     Dose/Rate Route Frequency Ordered Stop   03/24/23 2200  meropenem (MERREM) 1 g in sodium chloride 0.9 % 100 mL IVPB        1 g 200 mL/hr over 30 Minutes Intravenous Every 8 hours 03/24/23 1852     03/24/23 1315  ceFEPIme (MAXIPIME) 2 g in sodium chloride 0.9 % 100 mL IVPB        2 g 200 mL/hr over 30 Minutes Intravenous  Once 03/24/23 1310 03/24/23 1419   03/24/23 1315  metroNIDAZOLE (FLAGYL) IVPB 500 mg        500 mg 100 mL/hr over 60 Minutes Intravenous  Once 03/24/23 1310 03/24/23 1452   03/24/23  1315  vancomycin (VANCOCIN) IVPB 1000 mg/200 mL premix        1,000 mg 200 mL/hr over 60 Minutes Intravenous  Once 03/24/23 1310 03/24/23 1630              Family Communication/Anticipated D/C date and plan/Code Status   DVT prophylaxis:      Code Status: Full Code  Family Communication: None Disposition Plan: Plan to discharge home   Status is: Inpatient Remains inpatient appropriate because: Sepsis from UTI       Subjective:   Interval events noted.  She complains of lower abdominal pain.  Overall she feels better.  Objective:    Vitals:   03/24/23 2207 03/25/23 0117 03/25/23 0500 03/25/23 1232  BP: (!) 91/51 106/68 110/67 (!) 147/71  Pulse: 98 88 77 (!) 101  Resp: 16 15 16 16   Temp:  98 F (36.7 C) 98.2 F (36.8 C) 98.8 F  (37.1 C)  TempSrc:  Oral Oral Oral  SpO2: 99% 97% 96% 96%  Weight:      Height:       No data found.   Intake/Output Summary (Last 24 hours) at 03/25/2023 1338 Last data filed at 03/25/2023 0700 Gross per 24 hour  Intake 2009.42 ml  Output 1175 ml  Net 834.42 ml   Filed Weights   03/24/23 1301  Weight: 94.3 kg    Exam:  GEN: NAD SKIN: Warm and dry EYES: No pallor or icterus ENT: MMM CV: RRR PULM: CTA B ABD: soft, ND, suprapubic tenderness, +BS, + suprapubic catheter in place CNS: AAO x 3, paraparesis (has difficulty lifting bilateral feet.) EXT: No edema or tenderness    Pressure Injury 03/24/23 Sacrum Upper Stage 2 -  Partial thickness loss of dermis presenting as a shallow open injury with a red, pink wound bed without slough. two areas to sacrum (Active)  03/24/23 2300  Location: Sacrum  Location Orientation: Upper  Staging: Stage 2 -  Partial thickness loss of dermis presenting as a shallow open injury with a red, pink wound bed without slough.  Wound Description (Comments): two areas to sacrum  Present on Admission: Yes  Dressing Type Foam - Lift dressing to assess site every shift 03/24/23 2251     Data Reviewed:   I have personally reviewed following labs and imaging studies:  Labs: Labs show the following:   Basic Metabolic Panel: Recent Labs  Lab 03/24/23 1317 03/25/23 0600  NA 134* 133*  K 3.5 3.4*  CL 100 105  CO2 23 21*  GLUCOSE 151* 117*  BUN 14 14  CREATININE 0.57 0.77  CALCIUM 8.9 8.5*   GFR Estimated Creatinine Clearance: 81.1 mL/min (by C-G formula based on SCr of 0.77 mg/dL). Liver Function Tests: Recent Labs  Lab 03/24/23 1317  AST 30  ALT 32  ALKPHOS 95  BILITOT 1.0  PROT 7.5  ALBUMIN 3.1*   No results for input(s): "LIPASE", "AMYLASE" in the last 168 hours. No results for input(s): "AMMONIA" in the last 168 hours. Coagulation profile Recent Labs  Lab 03/23/23 1153 03/24/23 1317  INR 1.0 1.2    CBC: Recent  Labs  Lab 03/22/23 1011 03/23/23 1153 03/24/23 1317 03/25/23 0600  WBC 4.4 4.4 15.6* 12.7*  NEUTROABS 2.4  --  14.3*  --   HGB 11.4* 11.9* 12.6 10.6*  HCT 35.5* 36.8 38.9 32.1*  MCV 89.9 89.3 89.6 89.4  PLT 224 209 166 116*   Cardiac Enzymes: No results for input(s): "CKTOTAL", "CKMB", "CKMBINDEX", "TROPONINI"  in the last 168 hours. BNP (last 3 results) No results for input(s): "PROBNP" in the last 8760 hours. CBG: Recent Labs  Lab 03/25/23 1227  GLUCAP 136*   D-Dimer: No results for input(s): "DDIMER" in the last 72 hours. Hgb A1c: No results for input(s): "HGBA1C" in the last 72 hours. Lipid Profile: No results for input(s): "CHOL", "HDL", "LDLCALC", "TRIG", "CHOLHDL", "LDLDIRECT" in the last 72 hours. Thyroid function studies: No results for input(s): "TSH", "T4TOTAL", "T3FREE", "THYROIDAB" in the last 72 hours.  Invalid input(s): "FREET3" Anemia work up: No results for input(s): "VITAMINB12", "FOLATE", "FERRITIN", "TIBC", "IRON", "RETICCTPCT" in the last 72 hours. Sepsis Labs: Recent Labs  Lab 03/22/23 1011 03/23/23 1153 03/24/23 1317 03/24/23 2250 03/25/23 0600  WBC 4.4 4.4 15.6*  --  12.7*  LATICACIDVEN  --   --  1.1 1.4  --     Microbiology Recent Results (from the past 240 hours)  Blood Culture (routine x 2)     Status: None (Preliminary result)   Collection Time: 03/24/23  1:21 PM   Specimen: BLOOD LEFT ARM  Result Value Ref Range Status   Specimen Description BLOOD LEFT ARM  Final   Special Requests   Final    BOTTLES DRAWN AEROBIC AND ANAEROBIC Blood Culture results may not be optimal due to an inadequate volume of blood received in culture bottles   Culture   Final    NO GROWTH < 24 HOURS Performed at [facility], [address], [place], [place] [address]    Report Status PENDING  Incomplete  Resp panel by RT-PCR (RSV, Flu A&B, Covid) Anterior Nasal Swab     Status: None   Collection Time: 03/24/23  1:30 PM   Specimen: Anterior  Nasal Swab  Result Value Ref Range Status   SARS Coronavirus 2 by RT PCR NEGATIVE NEGATIVE Final    Comment: (NOTE) SARS-CoV-2 target nucleic acids are NOT DETECTED.  The SARS-CoV-2 RNA is generally detectable in upper respiratory specimens during the acute phase of infection. The lowest concentration of SARS-CoV-2 viral copies this assay can detect is 138 copies/mL. A negative result does not preclude SARS-Cov-2 infection and should not be used as the sole basis for treatment or other patient management decisions. A negative result may occur with  improper specimen collection/handling, submission of specimen other than nasopharyngeal swab, presence of viral mutation(s) within the areas targeted by this assay, and inadequate number of viral copies(<138 copies/mL). A negative result must be combined with clinical observations, patient history, and epidemiological information. The expected result is Negative.  Fact Sheet for Patients:  BloggerCourse.com  Fact Sheet for Healthcare Providers:  SeriousBroker.it  This test is no t yet approved or cleared by the [place] FDA and  has been authorized for detection and/or diagnosis of SARS-CoV-2 by FDA under an Emergency Use Authorization (EUA). This EUA will remain  in effect (meaning this test can be used) for the duration of the COVID-19 declaration under Section 564(b)(1) of the Act, 21 U.S.C.section 360bbb-3(b)(1), unless the authorization is terminated  or revoked sooner.       Influenza A by PCR NEGATIVE NEGATIVE Final   Influenza B by PCR NEGATIVE NEGATIVE Final    Comment: (NOTE) The Xpert Xpress SARS-CoV-2/FLU/RSV plus assay is intended as an aid in the diagnosis of influenza from Nasopharyngeal swab specimens and should not be used as a sole basis for treatment. Nasal washings and aspirates are unacceptable for Xpert Xpress SARS-CoV-2/FLU/RSV testing.  Fact Sheet for  Patients: BloggerCourse.com  Fact Sheet for Healthcare Providers: SeriousBroker.it  This test is not yet approved or cleared by the [place] FDA and has been authorized for detection and/or diagnosis of SARS-CoV-2 by FDA under an Emergency Use Authorization (EUA). This EUA will remain in effect (meaning this test can be used) for the duration of the COVID-19 declaration under Section 564(b)(1) of the Act, 21 U.S.C. section 360bbb-3(b)(1), unless the authorization is terminated or revoked.     Resp Syncytial Virus by PCR NEGATIVE NEGATIVE Final    Comment: (NOTE) Fact Sheet for Patients: BloggerCourse.com  Fact Sheet for Healthcare Providers: SeriousBroker.it  This test is not yet approved or cleared by the [place] FDA and has been authorized for detection and/or diagnosis of SARS-CoV-2 by FDA under an Emergency Use Authorization (EUA). This EUA will remain in effect (meaning this test can be used) for the duration of the COVID-19 declaration under Section 564(b)(1) of the Act, 21 U.S.C. section 360bbb-3(b)(1), unless the authorization is terminated or revoked.  Performed at [facility], [address] [place], [place] [address]   Blood Culture (routine x 2)     Status: None (Preliminary result)   Collection Time: 03/24/23  1:40 PM   Specimen: BLOOD  Result Value Ref Range Status   Specimen Description BLOOD BLOOD LEFT ARM  Final   Special Requests   Final    BOTTLES DRAWN AEROBIC AND ANAEROBIC Blood Culture adequate volume   Culture   Final    NO GROWTH < 24 HOURS Performed at [facility], [address] [place], [place] [address]    Report Status PENDING  Incomplete    Procedures and diagnostic studies:  CT ABDOMEN PELVIS W CONTRAST Result Date: 03/24/2023 CLINICAL DATA:  Lower abdominal pain, fever, tachycardia, sepsis. Chest had  suprapubic catheter placed yesterday. Evaluating for infection EXAM: CT ABDOMEN AND PELVIS WITH CONTRAST TECHNIQUE: Multidetector CT imaging of the abdomen and pelvis was performed using the standard protocol following bolus administration of intravenous contrast. RADIATION DOSE REDUCTION: This exam was performed according to the departmental dose-optimization program which includes automated exposure control, adjustment of the mA and/or kV according to patient size and/or use of iterative reconstruction technique. CONTRAST:  OMNIPAQUE IOHEXOL 300 MG/ML  SOLN COMPARISON:  CT abdomen pelvis 03/08/2023 FINDINGS: Lower chest: Small to moderate volume hiatal hernia. Hepatobiliary: No focal liver abnormality. Status post cholecystectomy. No biliary dilatation. Pancreas: No focal lesion. Normal pancreatic contour. No surrounding inflammatory changes. No main pancreatic ductal dilatation. Spleen: Normal in size without focal abnormality. Adrenals/Urinary Tract: No adrenal nodule bilaterally. Striated nephrograms bilaterally and bilateral urothelial thickening. No hydronephrosis. No hydroureter.  No nephroureterolithiasis. The urinary bladder is decompressed with suprapubic catheter terminating within its lumen. Urinary bladder wall thickening and perivesicular fat stranding. On delayed imaging, there is no urothelial wall thickening and there are no filling defects in the opacified portions of the bilateral collecting systems or ureters. Stomach/Bowel: Stomach is within normal limits. No evidence of bowel wall thickening or dilatation. Stool throughout the colon. Appendix appears normal. Vascular/Lymphatic: No abdominal aorta or iliac aneurysm. Moderate to severe atherosclerotic plaque of the aorta and its branches. No abdominal, pelvic, or inguinal lymphadenopathy. Reproductive: The uterus is lobulated and heterogeneous suggestive of underlying uterine fibroids. Uterus and bilateral adnexa are unremarkable. Other:  No intraperitoneal free fluid. No intraperitoneal free gas. No organized fluid collection. Musculoskeletal: No abdominal wall hernia or abnormality.  Diastasis rectus No suspicious lytic or blastic osseous lesions. No acute  displaced fracture. Multilevel degenerative changes of the spine. IMPRESSION: 1. Bilateral pyelonephritis and cystitis. 2. Constipation. 3. Small to moderate volume hiatal hernia. Electronically Signed   By: Tish Frederickson M.D.   On: 03/24/2023 17:29   DG Chest Port 1 View Result Date: 03/24/2023 CLINICAL DATA:  Possible sepsis. EXAM: PORTABLE CHEST 1 VIEW COMPARISON:  03/08/2023 FINDINGS: Normal sized heart. Clear lungs with normal vascularity. Cervical spine fixation hardware. IMPRESSION: No acute abnormality. Electronically Signed   By: Beckie Salts M.D.   On: 03/24/2023 15:57   IR Aspiration Bladder Insert Suprapubic Catheter Result Date: 03/23/2023 INDICATION: MS, neurogenic bladder EXAM: ULTRASOUND FLUOROSCOPIC 16 FRENCH SUPRAPUBIC CATHETER PLACEMENT MEDICATIONS: 1% LIDOCAINE LOCAL ANESTHESIA/SEDATION: Moderate (conscious) sedation was employed during this procedure. A total of Versed 3.0 mg and 0.5 mg Dilaudid was administered intravenously by the radiology nurse. Total intra-service moderate Sedation Time: 20 minutes. The patient's level of consciousness and vital signs were monitored continuously by radiology nursing throughout the procedure under my direct supervision. COMPLICATIONS: None immediate. Fluoroscopy: 16 mGy PROCEDURE: Informed written consent was obtained from the patient after a thorough discussion of the procedural risks, benefits and alternatives. All questions were addressed. Maximal Sterile Barrier Technique was utilized including caps, mask, sterile gowns, sterile gloves, sterile drape, hand hygiene and skin antiseptic. A timeout was performed prior to the initiation of the procedure. Utilizing the existing Foley catheter, saline was instilled to distend the  bladder. Bladder was visualized in the midline with ultrasound. Under sterile conditions and local anesthesia, percutaneous needle access performed with an 18 gauge 15 cm needle. Needle position confirmed with ultrasound. There was return of blood tinged urine. Guidewire inserted followed by balloon tract dilatation to advance a 16 French catheter. Catheter position confirmed with fluoroscopy and contrast injection. Catheter secured with a silk suture and a sterile dressing. Gravity drainage bag connected. No immediate complication. Patient tolerated the procedure well. IMPRESSION: Successful ultrasound and fluoroscopic 16 French suprapubic catheter placement. Electronically Signed   By: Judie Petit.  Shick M.D.   On: 03/23/2023 15:09               LOS: 1 day   Nile Prisk  [facility]   Pager on [email or web address]. If 7PM-7AM, please contact night-coverage at [email or web address]     03/25/2023, 1:38 PM

## 2023-03-26 DIAGNOSIS — N390 Urinary tract infection, site not specified: Secondary | ICD-10-CM | POA: Diagnosis not present

## 2023-03-26 LAB — CBC WITH DIFFERENTIAL/PLATELET
Abs Immature Granulocytes: 0.03 10*3/uL (ref 0.00–0.07)
Basophils Absolute: 0 10*3/uL (ref 0.0–0.1)
Basophils Relative: 0 %
Eosinophils Absolute: 0.1 10*3/uL (ref 0.0–0.5)
Eosinophils Relative: 1 %
HCT: 32 % — ABNORMAL LOW (ref 36.0–46.0)
Hemoglobin: 10.8 g/dL — ABNORMAL LOW (ref 12.0–15.0)
Immature Granulocytes: 0 %
Lymphocytes Relative: 18 %
Lymphs Abs: 1.4 10*3/uL (ref 0.7–4.0)
MCH: 29.3 pg (ref 26.0–34.0)
MCHC: 33.8 g/dL (ref 30.0–36.0)
MCV: 87 fL (ref 80.0–100.0)
Monocytes Absolute: 0.5 10*3/uL (ref 0.1–1.0)
Monocytes Relative: 7 %
Neutro Abs: 5.6 10*3/uL (ref 1.7–7.7)
Neutrophils Relative %: 74 %
Platelets: 124 10*3/uL — ABNORMAL LOW (ref 150–400)
RBC: 3.68 MIL/uL — ABNORMAL LOW (ref 3.87–5.11)
RDW: 14 % (ref 11.5–15.5)
WBC: 7.6 10*3/uL (ref 4.0–10.5)
nRBC: 0 % (ref 0.0–0.2)

## 2023-03-26 LAB — BASIC METABOLIC PANEL
Anion gap: 10 (ref 5–15)
BUN: 11 mg/dL (ref 8–23)
CO2: 23 mmol/L (ref 22–32)
Calcium: 8.6 mg/dL — ABNORMAL LOW (ref 8.9–10.3)
Chloride: 103 mmol/L (ref 98–111)
Creatinine, Ser: 0.54 mg/dL (ref 0.44–1.00)
GFR, Estimated: >60 mL/min (ref >60)
Glucose, Bld: 102 mg/dL — ABNORMAL HIGH (ref 70–99)
Potassium: 3.6 mmol/L (ref 3.5–5.1)
Sodium: 136 mmol/L (ref 135–145)

## 2023-03-26 LAB — BLOOD CULTURE ID PANEL (REFLEXED) - BCID2

## 2023-03-26 LAB — URINE CULTURE: Culture: >=100000 — AB

## 2023-03-26 MED ORDER — GUAIFENESIN-DM 100-10 MG/5ML PO SYRP
5.0000 mL | ORAL_SOLUTION | ORAL | Status: DC | PRN
  Administered 2023-03-26 – 2023-03-30 (×6): 5 mL via ORAL
  Filled 2023-03-26 (×6): qty 10

## 2023-03-26 MED ORDER — OXYCODONE HCL 5 MG PO TABS
5.0000 mg | ORAL_TABLET | Freq: Four times a day (QID) | ORAL | Status: DC | PRN
  Administered 2023-03-26 – 2023-03-27 (×4): 5 mg via ORAL
  Filled 2023-03-26 (×4): qty 1

## 2023-03-26 NOTE — Plan of Care (Signed)
  Problem: Clinical Measurements: Goal: Respiratory complications will improve Outcome: Progressing Goal: Cardiovascular complication will be avoided Outcome: Progressing   Problem: Clinical Measurements: Goal: Will remain free from infection Outcome: Not Progressing   

## 2023-03-26 NOTE — Consult Note (Signed)
PHARMACY - PHYSICIAN COMMUNICATION CRITICAL VALUE ALERT - BLOOD CULTURE IDENTIFICATION (BCID)  Rebecca [name] is an 63 y.o. female who presented to [facility] on 03/24/2023 with a chief complaint of complicated UTI.  Assessment: 1 out of 4 bottles growing GNR. BCID detects E. coli (no resistance detected)  Name of physician (or Provider) Contacted: Larkin Ina, NP  Current antibiotics: Meropenem  Changes to prescribed antibiotics recommended: Ceftriaxone Recommendations declined by provider due to history of resistant organism  Results for orders placed or performed during the hospital encounter of 03/24/23  Blood Culture ID Panel (Reflexed) (Collected: 03/24/2023  1:40 PM)  Result Value Ref Range   Enterococcus faecalis NOT DETECTED NOT DETECTED   Enterococcus Faecium NOT DETECTED NOT DETECTED   Listeria monocytogenes NOT DETECTED NOT DETECTED   Staphylococcus species NOT DETECTED NOT DETECTED   Staphylococcus aureus (BCID) NOT DETECTED NOT DETECTED   Staphylococcus epidermidis NOT DETECTED NOT DETECTED   Staphylococcus lugdunensis NOT DETECTED NOT DETECTED   Streptococcus species NOT DETECTED NOT DETECTED   Streptococcus agalactiae NOT DETECTED NOT DETECTED   Streptococcus pneumoniae NOT DETECTED NOT DETECTED   Streptococcus pyogenes NOT DETECTED NOT DETECTED   A.calcoaceticus-baumannii NOT DETECTED NOT DETECTED   Bacteroides fragilis NOT DETECTED NOT DETECTED   Enterobacterales DETECTED (A) NOT DETECTED   Enterobacter cloacae complex NOT DETECTED NOT DETECTED   Escherichia coli DETECTED (A) NOT DETECTED   Klebsiella aerogenes NOT DETECTED NOT DETECTED   Klebsiella oxytoca NOT DETECTED NOT DETECTED   Klebsiella pneumoniae NOT DETECTED NOT DETECTED   Proteus species NOT DETECTED NOT DETECTED   Salmonella species NOT DETECTED NOT DETECTED   Serratia marcescens NOT DETECTED NOT DETECTED   Haemophilus influenzae NOT DETECTED NOT DETECTED   Neisseria meningitidis NOT DETECTED NOT  DETECTED   Pseudomonas aeruginosa NOT DETECTED NOT DETECTED   Stenotrophomonas maltophilia NOT DETECTED NOT DETECTED   Candida albicans NOT DETECTED NOT DETECTED   Candida auris NOT DETECTED NOT DETECTED   Candida glabrata NOT DETECTED NOT DETECTED   Candida krusei NOT DETECTED NOT DETECTED   Candida parapsilosis NOT DETECTED NOT DETECTED   Candida tropicalis NOT DETECTED NOT DETECTED   Cryptococcus neoformans/gattii NOT DETECTED NOT DETECTED   CTX-M ESBL NOT DETECTED NOT DETECTED   Carbapenem resistance IMP NOT DETECTED NOT DETECTED   Carbapenem resistance KPC NOT DETECTED NOT DETECTED   Carbapenem resistance NDM NOT DETECTED NOT DETECTED   Carbapenem resist OXA 48 LIKE NOT DETECTED NOT DETECTED   Carbapenem resistance VIM NOT DETECTED NOT DETECTED    Celene Squibb, PharmD Clinical Pharmacist 03/26/2023 7:51 PM

## 2023-03-26 NOTE — Progress Notes (Signed)
Progress Note    Rebecca [name]  ZOX:[id number] DOB: 11-05-1960  DOA: 03/24/2023 PCP: Danelle Berry, PA-C      Brief Narrative:    Medical records reviewed and are as summarized below:  Rebecca [name] is a 63 y.o. female  with medical history significant of  MS, spasmatic paraplegia, bed-bound  neurogenic bladder, diastolic CHF, cardiac, chronic pancreatic insufficiency, lymphedema, breast cancer (s/p of right mastectomy), endocarditis, seizure, s/p of suprapubic catheter placement on 2/13.  She presented to the hospital with abdominal pain, fever, chills and bloody urine.      Assessment/Plan:   Principal Problem:   Complicated UTI (urinary tract infection) Active Problems:   Acute pyelonephritis   Sepsis (HCC)   Seizure disorder (HCC)   MS (multiple sclerosis) (HCC)   Neurogenic bladder   Pancreatic insufficiency   Chronic diastolic CHF (congestive heart failure) (HCC)   Obesity (BMI 30-39.9)    Body mass index is 35.7 kg/m.   Sepsis secondary to complicated UTI, acute pyelonephritis: Urine culture showed E. coli.  Sensitivity report is pending.  No growth on blood cultures thus far.  Continue IV meropenem.  Leukocytosis has improved. Of note, she was recently discharged from the hospital on 03/13/2023 after hospitalization from sepsis from pyelonephritis.  Urine culture from 03/08/2023 showed Providencia stuartii.   Hypokalemia, hyponatremia: Improved   Seizure disorder: Continue Keppra 500 mg twice daily.  Use IV Ativan as needed for seizures.  Seizure precautions.   Multiple sclerosis, spasmodic paraparesis: Continue amantadine, baclofen and tizanidine.  Hold interferon beta because of active infection.   Neurogenic bladder: S/p suprapubic catheter placement on 03/23/2023 (prior to admission)   Comorbidities include compensated chronic diastolic CHF, pancreatic insufficiency on Creon  Downgrade from progressive care unit to MedSurg unit.   Diet Order              Diet Heart Fluid consistency: Thin  Diet effective now                            Consultants: None  Procedures: None    Medications:    amantadine  100 mg Oral TID   baclofen  10-20 mg Oral TID   enoxaparin (LOVENOX) injection  0.5 mg/kg Subcutaneous Q24H   Interferon Beta-1a  44 mcg Subcutaneous Once per day on Monday Wednesday Friday   levETIRAcetam  500 mg Oral BID   lipase/protease/amylase  72,000 Units Oral TID AC   And   lipase/protease/amylase  36,000 Units Oral TID AC   loratadine  10 mg Oral Daily   multivitamin with minerals  1 tablet Oral Daily   oxybutynin  10 mg Oral BID   pantoprazole  40 mg Oral Daily   tiZANidine  4 mg Oral QHS   Continuous Infusions:  meropenem (MERREM) IV 1 g (03/26/23 1322)     Anti-infectives (From admission, onward)    Start     Dose/Rate Route Frequency Ordered Stop   03/24/23 2200  meropenem (MERREM) 1 g in sodium chloride 0.9 % 100 mL IVPB        1 g 200 mL/hr over 30 Minutes Intravenous Every 8 hours 03/24/23 1852     03/24/23 1315  ceFEPIme (MAXIPIME) 2 g in sodium chloride 0.9 % 100 mL IVPB        2 g 200 mL/hr over 30 Minutes Intravenous  Once 03/24/23 1310 03/24/23 1419   03/24/23 1315  metroNIDAZOLE (FLAGYL) IVPB  500 mg        500 mg 100 mL/hr over 60 Minutes Intravenous  Once 03/24/23 1310 03/24/23 1452   03/24/23 1315  vancomycin (VANCOCIN) IVPB 1000 mg/200 mL premix        1,000 mg 200 mL/hr over 60 Minutes Intravenous  Once 03/24/23 1310 03/24/23 1630              Family Communication/Anticipated D/C date and plan/Code Status   DVT prophylaxis:      Code Status: Full Code  Family Communication: None Disposition Plan: Plan to discharge home   Status is: Inpatient Remains inpatient appropriate because: Sepsis from UTI       Subjective:   No acute events overnight.  She complained of pain under right flank.  No abdominal pain is better.  No other  complaints.  Objective:    Vitals:   03/26/23 0114 03/26/23 0548 03/26/23 0923 03/26/23 1314  BP: 99/79 (!) 109/58 134/76 (!) 154/81  Pulse: 91 89 88 99  Resp: (!) 22 (!) 22 20 18   Temp: 98.6 F (37 C) 98.4 F (36.9 C) 98.2 F (36.8 C) 98.4 F (36.9 C)  TempSrc: Oral Oral  Oral  SpO2: 92% 97% 96% 93%  Weight:      Height:       No data found.   Intake/Output Summary (Last 24 hours) at 03/26/2023 1417 Last data filed at 03/26/2023 1610 Gross per 24 hour  Intake 600 ml  Output 1625 ml  Net -1025 ml   Filed Weights   03/24/23 1301  Weight: 94.3 kg    Exam:  GEN: NAD SKIN: Warm and dry EYES: No pallor or icterus ENT: MMM CV: RRR PULM: CTA B ABD: soft, ND, NT, +BS, + suprapubic catheter CNS: AAO x 3, paraparesis EXT: Mild bilateral leg edema, no tenderness    Data Reviewed:   I have personally reviewed following labs and imaging studies:  Labs: Labs show the following:   Basic Metabolic Panel: Recent Labs  Lab 03/24/23 1317 03/25/23 0600 03/26/23 0424  NA 134* 133* 136  K 3.5 3.4* 3.6  CL 100 105 103  CO2 23 21* 23  GLUCOSE 151* 117* 102*  BUN 14 14 11   CREATININE 0.57 0.77 0.54  CALCIUM 8.9 8.5* 8.6*   GFR Estimated Creatinine Clearance: 81.1 mL/min (by C-G formula based on SCr of 0.54 mg/dL). Liver Function Tests: Recent Labs  Lab 03/24/23 1317  AST 30  ALT 32  ALKPHOS 95  BILITOT 1.0  PROT 7.5  ALBUMIN 3.1*   No results for input(s): "LIPASE", "AMYLASE" in the last 168 hours. No results for input(s): "AMMONIA" in the last 168 hours. Coagulation profile Recent Labs  Lab 03/23/23 1153 03/24/23 1317  INR 1.0 1.2    CBC: Recent Labs  Lab 03/22/23 1011 03/23/23 1153 03/24/23 1317 03/25/23 0600 03/26/23 0424  WBC 4.4 4.4 15.6* 12.7* 7.6  NEUTROABS 2.4  --  14.3*  --  5.6  HGB 11.4* 11.9* 12.6 10.6* 10.8*  HCT 35.5* 36.8 38.9 32.1* 32.0*  MCV 89.9 89.3 89.6 89.4 87.0  PLT 224 209 166 116* 124*   Cardiac Enzymes: No  results for input(s): "CKTOTAL", "CKMB", "CKMBINDEX", "TROPONINI" in the last 168 hours. BNP (last 3 results) No results for input(s): "PROBNP" in the last 8760 hours. CBG: Recent Labs  Lab 03/25/23 1227  GLUCAP 136*   D-Dimer: No results for input(s): "DDIMER" in the last 72 hours. Hgb A1c: No results for input(s): "HGBA1C" in  the last 72 hours. Lipid Profile: No results for input(s): "CHOL", "HDL", "LDLCALC", "TRIG", "CHOLHDL", "LDLDIRECT" in the last 72 hours. Thyroid function studies: No results for input(s): "TSH", "T4TOTAL", "T3FREE", "THYROIDAB" in the last 72 hours.  Invalid input(s): "FREET3" Anemia work up: No results for input(s): "VITAMINB12", "FOLATE", "FERRITIN", "TIBC", "IRON", "RETICCTPCT" in the last 72 hours. Sepsis Labs: Recent Labs  Lab 03/23/23 1153 03/24/23 1317 03/24/23 2250 03/25/23 0600 03/26/23 0424  WBC 4.4 15.6*  --  12.7* 7.6  LATICACIDVEN  --  1.1 1.4  --   --     Microbiology Recent Results (from the past 240 hours)  Blood Culture (routine x 2)     Status: None (Preliminary result)   Collection Time: 03/24/23  1:21 PM   Specimen: BLOOD LEFT ARM  Result Value Ref Range Status   Specimen Description BLOOD LEFT ARM  Final   Special Requests   Final    BOTTLES DRAWN AEROBIC AND ANAEROBIC Blood Culture results may not be optimal due to an inadequate volume of blood received in culture bottles   Culture   Final    NO GROWTH 2 DAYS Performed at [facility], [address] [place], [place] [address]    Report Status PENDING  Incomplete  Resp panel by RT-PCR (RSV, Flu A&B, Covid) Anterior Nasal Swab     Status: None   Collection Time: 03/24/23  1:30 PM   Specimen: Anterior Nasal Swab  Result Value Ref Range Status   SARS Coronavirus 2 by RT PCR NEGATIVE NEGATIVE Final    Comment: (NOTE) SARS-CoV-2 target nucleic acids are NOT DETECTED.  The SARS-CoV-2 RNA is generally detectable in upper respiratory specimens during the acute  phase of infection. The lowest concentration of SARS-CoV-2 viral copies this assay can detect is 138 copies/mL. A negative result does not preclude SARS-Cov-2 infection and should not be used as the sole basis for treatment or other patient management decisions. A negative result may occur with  improper specimen collection/handling, submission of specimen other than nasopharyngeal swab, presence of viral mutation(s) within the areas targeted by this assay, and inadequate number of viral copies(<138 copies/mL). A negative result must be combined with clinical observations, patient history, and epidemiological information. The expected result is Negative.  Fact Sheet for Patients:  BloggerCourse.com  Fact Sheet for Healthcare Providers:  SeriousBroker.it  This test is no t yet approved or cleared by the [place] FDA and  has been authorized for detection and/or diagnosis of SARS-CoV-2 by FDA under an Emergency Use Authorization (EUA). This EUA will remain  in effect (meaning this test can be used) for the duration of the COVID-19 declaration under Section 564(b)(1) of the Act, 21 U.S.C.section 360bbb-3(b)(1), unless the authorization is terminated  or revoked sooner.       Influenza A by PCR NEGATIVE NEGATIVE Final   Influenza B by PCR NEGATIVE NEGATIVE Final    Comment: (NOTE) The Xpert Xpress SARS-CoV-2/FLU/RSV plus assay is intended as an aid in the diagnosis of influenza from Nasopharyngeal swab specimens and should not be used as a sole basis for treatment. Nasal washings and aspirates are unacceptable for Xpert Xpress SARS-CoV-2/FLU/RSV testing.  Fact Sheet for Patients: BloggerCourse.com  Fact Sheet for Healthcare Providers: SeriousBroker.it  This test is not yet approved or cleared by the [place] FDA and has been authorized for detection and/or diagnosis of  SARS-CoV-2 by FDA under an Emergency Use Authorization (EUA). This EUA will remain in effect (meaning this test can  be used) for the duration of the COVID-19 declaration under Section 564(b)(1) of the Act, 21 U.S.C. section 360bbb-3(b)(1), unless the authorization is terminated or revoked.     Resp Syncytial Virus by PCR NEGATIVE NEGATIVE Final    Comment: (NOTE) Fact Sheet for Patients: BloggerCourse.com  Fact Sheet for Healthcare Providers: SeriousBroker.it  This test is not yet approved or cleared by the [place] FDA and has been authorized for detection and/or diagnosis of SARS-CoV-2 by FDA under an Emergency Use Authorization (EUA). This EUA will remain in effect (meaning this test can be used) for the duration of the COVID-19 declaration under Section 564(b)(1) of the Act, 21 U.S.C. section 360bbb-3(b)(1), unless the authorization is terminated or revoked.  Performed at [facility], [address], [place], [place] [address]   Urine Culture     Status: Abnormal (Preliminary result)   Collection Time: 03/24/23  1:30 PM   Specimen: Urine, Random  Result Value Ref Range Status   Specimen Description   Final    URINE, RANDOM Performed at [facility], [address], [place], [place] [address]    Special Requests   Final    NONE Reflexed from [id number] Performed at [facility], [address] [place], [place] [address]    Culture >=100,000 COLONIES/mL ESCHERICHIA COLI (A)  Final   Report Status PENDING  Incomplete  Blood Culture (routine x 2)     Status: None (Preliminary result)   Collection Time: 03/24/23  1:40 PM   Specimen: BLOOD  Result Value Ref Range Status   Specimen Description BLOOD BLOOD LEFT ARM  Final   Special Requests   Final    BOTTLES DRAWN AEROBIC AND ANAEROBIC Blood Culture adequate volume   Culture   Final    NO GROWTH 2 DAYS Performed at [facility],  [address], [place], [place] [address]    Report Status PENDING  Incomplete    Procedures and diagnostic studies:  CT ABDOMEN PELVIS W CONTRAST Result Date: 03/24/2023 CLINICAL DATA:  Lower abdominal pain, fever, tachycardia, sepsis. Chest had suprapubic catheter placed yesterday. Evaluating for infection EXAM: CT ABDOMEN AND PELVIS WITH CONTRAST TECHNIQUE: Multidetector CT imaging of the abdomen and pelvis was performed using the standard protocol following bolus administration of intravenous contrast. RADIATION DOSE REDUCTION: This exam was performed according to the departmental dose-optimization program which includes automated exposure control, adjustment of the mA and/or kV according to patient size and/or use of iterative reconstruction technique. CONTRAST:  OMNIPAQUE IOHEXOL 300 MG/ML  SOLN COMPARISON:  CT abdomen pelvis 03/08/2023 FINDINGS: Lower chest: Small to moderate volume hiatal hernia. Hepatobiliary: No focal liver abnormality. Status post cholecystectomy. No biliary dilatation. Pancreas: No focal lesion. Normal pancreatic contour. No surrounding inflammatory changes. No main pancreatic ductal dilatation. Spleen: Normal in size without focal abnormality. Adrenals/Urinary Tract: No adrenal nodule bilaterally. Striated nephrograms bilaterally and bilateral urothelial thickening. No hydronephrosis. No hydroureter.  No nephroureterolithiasis. The urinary bladder is decompressed with suprapubic catheter terminating within its lumen. Urinary bladder wall thickening and perivesicular fat stranding. On delayed imaging, there is no urothelial wall thickening and there are no filling defects in the opacified portions of the bilateral collecting systems or ureters. Stomach/Bowel: Stomach is within normal limits. No evidence of bowel wall thickening or dilatation. Stool throughout the colon. Appendix appears normal. Vascular/Lymphatic: No abdominal aorta or iliac aneurysm. Moderate to severe  atherosclerotic plaque of the aorta and its branches. No abdominal, pelvic, or inguinal lymphadenopathy. Reproductive: The uterus  is lobulated and heterogeneous suggestive of underlying uterine fibroids. Uterus and bilateral adnexa are unremarkable. Other: No intraperitoneal free fluid. No intraperitoneal free gas. No organized fluid collection. Musculoskeletal: No abdominal wall hernia or abnormality.  Diastasis rectus No suspicious lytic or blastic osseous lesions. No acute displaced fracture. Multilevel degenerative changes of the spine. IMPRESSION: 1. Bilateral pyelonephritis and cystitis. 2. Constipation. 3. Small to moderate volume hiatal hernia. Electronically Signed   By: Tish Frederickson M.D.   On: 03/24/2023 17:29               LOS: 2 days   Aryahna Spagna  [facility]   Pager on [email or web address]. If 7PM-7AM, please contact night-coverage at [email or web address]     03/26/2023, 2:17 PM

## 2023-03-26 NOTE — TOC CM/SW Note (Addendum)
Per notes, patient recently set up with [facility]. CSW reached out to [facility] Rep Laurelyn Sickle - she is checking to see if they are still active.  12:35- Patient is active with [facility] RN, PT, and OT.  Rebecca Ramus, LCSW Transitions of Care Department [phone]

## 2023-03-27 DIAGNOSIS — N390 Urinary tract infection, site not specified: Secondary | ICD-10-CM | POA: Diagnosis not present

## 2023-03-27 MED ORDER — SODIUM CHLORIDE 0.9 % IV SOLN
2.0000 g | Freq: Every day | INTRAVENOUS | Status: DC
  Administered 2023-03-28 – 2023-04-02 (×6): 2 g via INTRAVENOUS
  Filled 2023-03-27 (×7): qty 20

## 2023-03-27 NOTE — Plan of Care (Signed)

## 2023-03-27 NOTE — Progress Notes (Addendum)
Progress Note    Rebecca [name]  BJY:[id number] DOB: Oct 10, 1960  DOA: 03/24/2023 PCP: Danelle Berry, PA-C      Brief Narrative:    Medical records reviewed and are as summarized below:  Rebecca [name] is a 63 y.o. female  with medical history significant of  MS, spasmatic paraplegia, bed-bound  neurogenic bladder, diastolic CHF, cardiac, chronic pancreatic insufficiency, lymphedema, breast cancer (s/p of right mastectomy), endocarditis, seizure, s/p of suprapubic catheter placement on 2/13.  She presented to the hospital with abdominal pain, fever, chills and bloody urine.      Assessment/Plan:   Principal Problem:   Complicated UTI (urinary tract infection) Active Problems:   Acute pyelonephritis   Sepsis (HCC)   Seizure disorder (HCC)   MS (multiple sclerosis) (HCC)   Neurogenic bladder   Pancreatic insufficiency   Chronic diastolic CHF (congestive heart failure) (HCC)   Obesity (BMI 30-39.9)    Body mass index is 35.7 kg/m.   Sepsis secondary to complicated E. coli UTI, acute pyelonephritis, gram-negative rod bacteremia: Urine culture showed E. coli that is resistant to ampicillin and ciprofloxacin but sensitive to cephalosporins and Bactrim.  Patient is allergic to sulfa. Blood culture showed gram-negative rods.  Species identification and sensitivity report is pending. Leukocytosis has improved. Change IV meropenem to IV ceftriaxone.  Of note, she was recently discharged from the hospital on 03/13/2023 after hospitalization from sepsis from pyelonephritis.  Urine culture from 03/08/2023 showed Providencia stuartii.   Hypokalemia, hyponatremia: Improved   Seizure disorder: Continue Keppra 500 mg twice daily.  Use IV Ativan as needed for seizures.  Seizure precautions.   Multiple sclerosis, spasmodic paraparesis: Continue amantadine, baclofen and tizanidine.  Hold interferon beta because of active infection.   Neurogenic bladder, hematuria: S/p suprapubic  catheter placement on 03/23/2023 (prior to admission).  Hematuria is probably from UTI.  Follow-up with urologist as an outpatient.   Comorbidities include compensated chronic diastolic CHF, pancreatic insufficiency on Creon     Diet Order             Diet Heart Fluid consistency: Thin  Diet effective now                            Consultants: None  Procedures: None    Medications:    amantadine  100 mg Oral TID   baclofen  10-20 mg Oral TID   enoxaparin (LOVENOX) injection  0.5 mg/kg Subcutaneous Q24H   levETIRAcetam  500 mg Oral BID   lipase/protease/amylase  72,000 Units Oral TID AC   And   lipase/protease/amylase  36,000 Units Oral TID AC   loratadine  10 mg Oral Daily   multivitamin with minerals  1 tablet Oral Daily   oxybutynin  10 mg Oral BID   pantoprazole  40 mg Oral Daily   tiZANidine  4 mg Oral QHS   Continuous Infusions:  cefTRIAXone (ROCEPHIN)  IV       Anti-infectives (From admission, onward)    Start     Dose/Rate Route Frequency Ordered Stop   03/27/23 1500  cefTRIAXone (ROCEPHIN) 2 g in sodium chloride 0.9 % 100 mL IVPB        2 g 200 mL/hr over 30 Minutes Intravenous Daily 03/27/23 1403     03/24/23 2200  meropenem (MERREM) 1 g in sodium chloride 0.9 % 100 mL IVPB  Status:  Discontinued        1 g 200  mL/hr over 30 Minutes Intravenous Every 8 hours 03/24/23 1852 03/27/23 1403   03/24/23 1315  ceFEPIme (MAXIPIME) 2 g in sodium chloride 0.9 % 100 mL IVPB        2 g 200 mL/hr over 30 Minutes Intravenous  Once 03/24/23 1310 03/24/23 1419   03/24/23 1315  metroNIDAZOLE (FLAGYL) IVPB 500 mg        500 mg 100 mL/hr over 60 Minutes Intravenous  Once 03/24/23 1310 03/24/23 1452   03/24/23 1315  vancomycin (VANCOCIN) IVPB 1000 mg/200 mL premix        1,000 mg 200 mL/hr over 60 Minutes Intravenous  Once 03/24/23 1310 03/24/23 1630              Family Communication/Anticipated D/C date and plan/Code Status   DVT  prophylaxis:      Code Status: Full Code  Family Communication: None Disposition Plan: Plan to discharge home   Status is: Inpatient Remains inpatient appropriate because: Sepsis from UTI       Subjective:   Interval events noted.  He complains of cough.  No shortness of breath or chest pain.  Abdominal pain and flank pain have improved.    Objective:    Vitals:   03/26/23 2021 03/26/23 2357 03/27/23 0416 03/27/23 0913  BP: 130/70 139/74 (!) 146/74 (!) 150/70  Pulse: 98 93 (!) 107 (!) 103  Resp: 20 20 16    Temp: 99.1 F (37.3 C) 98.5 F (36.9 C) 99.7 F (37.6 C) 100 F (37.8 C)  TempSrc: Oral Oral Oral Oral  SpO2: 94% 95% 90% 93%  Weight:      Height:       No data found.   Intake/Output Summary (Last 24 hours) at 03/27/2023 1530 Last data filed at 03/27/2023 9604 Gross per 24 hour  Intake --  Output 600 ml  Net -600 ml   Filed Weights   03/24/23 1301  Weight: 94.3 kg    Exam:  GEN: NAD SKIN: Warm and dry EYES: No pallor or icterus ENT: MMM CV: RRR PULM: CTA B ABD: soft, ND, NT, +BS CNS: AAO x 3, paraparesis EXT: No edema or tenderness GU: Suprapubic catheter draining bloody urine   Data Reviewed:   I have personally reviewed following labs and imaging studies:  Labs: Labs show the following:   Basic Metabolic Panel: Recent Labs  Lab 03/24/23 1317 03/25/23 0600 03/26/23 0424  NA 134* 133* 136  K 3.5 3.4* 3.6  CL 100 105 103  CO2 23 21* 23  GLUCOSE 151* 117* 102*  BUN 14 14 11   CREATININE 0.57 0.77 0.54  CALCIUM 8.9 8.5* 8.6*   GFR Estimated Creatinine Clearance: 81.1 mL/min (by C-G formula based on SCr of 0.54 mg/dL). Liver Function Tests: Recent Labs  Lab 03/24/23 1317  AST 30  ALT 32  ALKPHOS 95  BILITOT 1.0  PROT 7.5  ALBUMIN 3.1*   No results for input(s): "LIPASE", "AMYLASE" in the last 168 hours. No results for input(s): "AMMONIA" in the last 168 hours. Coagulation profile Recent Labs  Lab 03/23/23 1153  03/24/23 1317  INR 1.0 1.2    CBC: Recent Labs  Lab 03/22/23 1011 03/23/23 1153 03/24/23 1317 03/25/23 0600 03/26/23 0424  WBC 4.4 4.4 15.6* 12.7* 7.6  NEUTROABS 2.4  --  14.3*  --  5.6  HGB 11.4* 11.9* 12.6 10.6* 10.8*  HCT 35.5* 36.8 38.9 32.1* 32.0*  MCV 89.9 89.3 89.6 89.4 87.0  PLT 224 209 166 116* 124*  Cardiac Enzymes: No results for input(s): "CKTOTAL", "CKMB", "CKMBINDEX", "TROPONINI" in the last 168 hours. BNP (last 3 results) No results for input(s): "PROBNP" in the last 8760 hours. CBG: Recent Labs  Lab 03/25/23 1227  GLUCAP 136*   D-Dimer: No results for input(s): "DDIMER" in the last 72 hours. Hgb A1c: No results for input(s): "HGBA1C" in the last 72 hours. Lipid Profile: No results for input(s): "CHOL", "HDL", "LDLCALC", "TRIG", "CHOLHDL", "LDLDIRECT" in the last 72 hours. Thyroid function studies: No results for input(s): "TSH", "T4TOTAL", "T3FREE", "THYROIDAB" in the last 72 hours.  Invalid input(s): "FREET3" Anemia work up: No results for input(s): "VITAMINB12", "FOLATE", "FERRITIN", "TIBC", "IRON", "RETICCTPCT" in the last 72 hours. Sepsis Labs: Recent Labs  Lab 03/23/23 1153 03/24/23 1317 03/24/23 2250 03/25/23 0600 03/26/23 0424  WBC 4.4 15.6*  --  12.7* 7.6  LATICACIDVEN  --  1.1 1.4  --   --     Microbiology Recent Results (from the past 240 hours)  Blood Culture (routine x 2)     Status: None (Preliminary result)   Collection Time: 03/24/23  1:21 PM   Specimen: BLOOD LEFT ARM  Result Value Ref Range Status   Specimen Description BLOOD LEFT ARM  Final   Special Requests   Final    BOTTLES DRAWN AEROBIC AND ANAEROBIC Blood Culture results may not be optimal due to an inadequate volume of blood received in culture bottles   Culture   Final    NO GROWTH 3 DAYS Performed at [facility], [address] [place], [place] [address]    Report Status PENDING  Incomplete  Resp panel by RT-PCR (RSV, Flu A&B, Covid) Anterior  Nasal Swab     Status: None   Collection Time: 03/24/23  1:30 PM   Specimen: Anterior Nasal Swab  Result Value Ref Range Status   SARS Coronavirus 2 by RT PCR NEGATIVE NEGATIVE Final    Comment: (NOTE) SARS-CoV-2 target nucleic acids are NOT DETECTED.  The SARS-CoV-2 RNA is generally detectable in upper respiratory specimens during the acute phase of infection. The lowest concentration of SARS-CoV-2 viral copies this assay can detect is 138 copies/mL. A negative result does not preclude SARS-Cov-2 infection and should not be used as the sole basis for treatment or other patient management decisions. A negative result may occur with  improper specimen collection/handling, submission of specimen other than nasopharyngeal swab, presence of viral mutation(s) within the areas targeted by this assay, and inadequate number of viral copies(<138 copies/mL). A negative result must be combined with clinical observations, patient history, and epidemiological information. The expected result is Negative.  Fact Sheet for Patients:  BloggerCourse.com  Fact Sheet for Healthcare Providers:  SeriousBroker.it  This test is no t yet approved or cleared by the [place] FDA and  has been authorized for detection and/or diagnosis of SARS-CoV-2 by FDA under an Emergency Use Authorization (EUA). This EUA will remain  in effect (meaning this test can be used) for the duration of the COVID-19 declaration under Section 564(b)(1) of the Act, 21 U.S.C.section 360bbb-3(b)(1), unless the authorization is terminated  or revoked sooner.       Influenza A by PCR NEGATIVE NEGATIVE Final   Influenza B by PCR NEGATIVE NEGATIVE Final    Comment: (NOTE) The Xpert Xpress SARS-CoV-2/FLU/RSV plus assay is intended as an aid in the diagnosis of influenza from Nasopharyngeal swab specimens and should not be used as a sole basis for treatment. Nasal washings  and aspirates are unacceptable  for Xpert Xpress SARS-CoV-2/FLU/RSV testing.  Fact Sheet for Patients: BloggerCourse.com  Fact Sheet for Healthcare Providers: SeriousBroker.it  This test is not yet approved or cleared by the [place] FDA and has been authorized for detection and/or diagnosis of SARS-CoV-2 by FDA under an Emergency Use Authorization (EUA). This EUA will remain in effect (meaning this test can be used) for the duration of the COVID-19 declaration under Section 564(b)(1) of the Act, 21 U.S.C. section 360bbb-3(b)(1), unless the authorization is terminated or revoked.     Resp Syncytial Virus by PCR NEGATIVE NEGATIVE Final    Comment: (NOTE) Fact Sheet for Patients: BloggerCourse.com  Fact Sheet for Healthcare Providers: SeriousBroker.it  This test is not yet approved or cleared by the [place] FDA and has been authorized for detection and/or diagnosis of SARS-CoV-2 by FDA under an Emergency Use Authorization (EUA). This EUA will remain in effect (meaning this test can be used) for the duration of the COVID-19 declaration under Section 564(b)(1) of the Act, 21 U.S.C. section 360bbb-3(b)(1), unless the authorization is terminated or revoked.  Performed at [facility], [address] [place], [place] [address]   Urine Culture     Status: Abnormal   Collection Time: 03/24/23  1:30 PM   Specimen: Urine, Random  Result Value Ref Range Status   Specimen Description   Final    URINE, RANDOM Performed at [facility], [address] [place], [place] [address]    Special Requests   Final    NONE Reflexed from [id number] Performed at [facility], [address] [place], [place] [address]    Culture >=100,000 COLONIES/mL ESCHERICHIA COLI (A)  Final   Report Status 03/26/2023 FINAL  Final   Organism ID, Bacteria ESCHERICHIA COLI  (A)  Final      Susceptibility   Escherichia coli - MIC*    AMPICILLIN >=32 RESISTANT Resistant     CEFAZOLIN <=4 SENSITIVE Sensitive     CEFEPIME <=0.12 SENSITIVE Sensitive     CEFTRIAXONE <=0.25 SENSITIVE Sensitive     CIPROFLOXACIN >=4 RESISTANT Resistant     GENTAMICIN <=1 SENSITIVE Sensitive     IMIPENEM <=0.25 SENSITIVE Sensitive     NITROFURANTOIN 32 SENSITIVE Sensitive     TRIMETH/SULFA <=20 SENSITIVE Sensitive     AMPICILLIN/SULBACTAM 8 SENSITIVE Sensitive     PIP/TAZO <=4 SENSITIVE Sensitive ug/mL    * >=100,000 COLONIES/mL ESCHERICHIA COLI  Blood Culture (routine x 2)     Status: None (Preliminary result)   Collection Time: 03/24/23  1:40 PM   Specimen: BLOOD  Result Value Ref Range Status   Specimen Description   Final    BLOOD BLOOD LEFT ARM Performed at [facility], [address], [place], [place] [address]    Special Requests   Final    BOTTLES DRAWN AEROBIC AND ANAEROBIC Blood Culture adequate volume Performed at [facility], [address] [place], [place] [address]    Culture  Setup Time   Final    GRAM NEGATIVE RODS AEROBIC BOTTLE ONLY CRITICAL RESULT CALLED TO, READ BACK BY AND VERIFIED WITH: CAROLYN COULTER @1837  03/26/23 LFD    Culture GRAM NEGATIVE RODS  Final   Report Status PENDING  Incomplete  Blood Culture ID Panel (Reflexed)     Status: Abnormal   Collection Time: 03/24/23  1:40 PM  Result Value Ref Range Status   Enterococcus faecalis NOT DETECTED NOT DETECTED Final   Enterococcus Faecium NOT DETECTED NOT DETECTED Final  Listeria monocytogenes NOT DETECTED NOT DETECTED Final   Staphylococcus species NOT DETECTED NOT DETECTED Final   Staphylococcus aureus (BCID) NOT DETECTED NOT DETECTED Final   Staphylococcus epidermidis NOT DETECTED NOT DETECTED Final   Staphylococcus lugdunensis NOT DETECTED NOT DETECTED Final   Streptococcus species NOT DETECTED NOT DETECTED Final   Streptococcus agalactiae NOT DETECTED NOT  DETECTED Final   Streptococcus pneumoniae NOT DETECTED NOT DETECTED Final   Streptococcus pyogenes NOT DETECTED NOT DETECTED Final   A.calcoaceticus-baumannii NOT DETECTED NOT DETECTED Final   Bacteroides fragilis NOT DETECTED NOT DETECTED Final   Enterobacterales DETECTED (A) NOT DETECTED Final    Comment: Enterobacterales represent a large order of gram negative bacteria, not a single organism. CRITICAL RESULT CALLED TO, READ BACK BY AND VERIFIED WITH: CAROLYN COULTER @ 1837 03/26/23 LFD    Enterobacter cloacae complex NOT DETECTED NOT DETECTED Final   Escherichia coli DETECTED (A) NOT DETECTED Final    Comment: CRITICAL RESULT CALLED TO, READ BACK BY AND VERIFIED WITH: CAROLYN COULTER @ 1837 03/26/23 LFD    Klebsiella aerogenes NOT DETECTED NOT DETECTED Final   Klebsiella oxytoca NOT DETECTED NOT DETECTED Final   Klebsiella pneumoniae NOT DETECTED NOT DETECTED Final   Proteus species NOT DETECTED NOT DETECTED Final   Salmonella species NOT DETECTED NOT DETECTED Final   Serratia marcescens NOT DETECTED NOT DETECTED Final   Haemophilus influenzae NOT DETECTED NOT DETECTED Final   Neisseria meningitidis NOT DETECTED NOT DETECTED Final   Pseudomonas aeruginosa NOT DETECTED NOT DETECTED Final   Stenotrophomonas maltophilia NOT DETECTED NOT DETECTED Final   Candida albicans NOT DETECTED NOT DETECTED Final   Candida auris NOT DETECTED NOT DETECTED Final   Candida glabrata NOT DETECTED NOT DETECTED Final   Candida krusei NOT DETECTED NOT DETECTED Final   Candida parapsilosis NOT DETECTED NOT DETECTED Final   Candida tropicalis NOT DETECTED NOT DETECTED Final   Cryptococcus neoformans/gattii NOT DETECTED NOT DETECTED Final   CTX-M ESBL NOT DETECTED NOT DETECTED Final   Carbapenem resistance IMP NOT DETECTED NOT DETECTED Final   Carbapenem resistance KPC NOT DETECTED NOT DETECTED Final   Carbapenem resistance NDM NOT DETECTED NOT DETECTED Final   Carbapenem resist OXA 48 LIKE NOT  DETECTED NOT DETECTED Final   Carbapenem resistance VIM NOT DETECTED NOT DETECTED Final    Comment: Performed at [facility], [address] [place], [place] [address]    Procedures and diagnostic studies:  No results found.              LOS: 3 days   Yashua Bracco  [facility]   Pager on [email or web address]. If 7PM-7AM, please contact night-coverage at [email or web address]     03/27/2023, 3:30 PM

## 2023-03-27 NOTE — Care Management Important Message (Signed)
Important Message  Patient Details  Name: Rebecca [name] MRN: [id number] Date of Birth: Apr 26, 1960   Important Message Given:  Yes - Medicare IM     Cyncere Sontag W, CMA 03/27/2023, 10:16 AM

## 2023-03-28 ENCOUNTER — Telehealth: Payer: Self-pay | Admitting: Urology

## 2023-03-28 ENCOUNTER — Inpatient Hospital Stay: Payer: 59

## 2023-03-28 DIAGNOSIS — N390 Urinary tract infection, site not specified: Secondary | ICD-10-CM | POA: Diagnosis not present

## 2023-03-28 NOTE — Progress Notes (Addendum)
Progress Note    Rebecca [name]  YNW:[id number] DOB: [birth year]  DOA: 03/24/2023 PCP: Danelle Berry, PA-C      Brief Narrative:    Medical records reviewed and are as summarized below:  Rebecca [name] is a 63 y.o. female  with medical history significant of  MS, spasmatic paraplegia, bed-bound  neurogenic bladder, diastolic CHF, cardiac, chronic pancreatic insufficiency, lymphedema, breast cancer (s/p of right mastectomy), endocarditis, seizure, s/p of suprapubic catheter placement on 2/13.  She presented to the hospital with abdominal pain, fever, chills and bloody urine.      Assessment/Plan:   Principal Problem:   Complicated UTI (urinary tract infection) Active Problems:   Acute pyelonephritis   Sepsis (HCC)   Seizure disorder (HCC)   MS (multiple sclerosis) (HCC)   Neurogenic bladder   Pancreatic insufficiency   Chronic diastolic CHF (congestive heart failure) (HCC)   Obesity (BMI 30-39.9)    Body mass index is 35.7 kg/m.   Sepsis secondary to complicated E. coli UTI, acute pyelonephritis, E. coli bacteremia: Urine culture showed E. coli that is resistant to ampicillin and ciprofloxacin but sensitive to cephalosporins and Bactrim.  Patient is allergic to sulfa. Blood culture showed E. coli but susceptibility report is pending.  Leukocytosis has improved. Continue IV ceftriaxone.  She may need IV antibiotics at discharge. Patient has been told she may need a PICC line for IV antibiotics depending on susceptibility report.   Of note, she was recently discharged from the hospital on 03/13/2023 after hospitalization from sepsis from pyelonephritis.  Urine culture from 03/08/2023 showed Providencia stuartii.   Hypokalemia, hyponatremia: Improved   Seizure disorder: Continue Keppra 500 mg twice daily.  Use IV Ativan as needed for seizures.  Seizure precautions.   Multiple sclerosis, spasmodic paraparesis: Continue amantadine, baclofen and tizanidine.  Hold  interferon beta because of active infection.   Neurogenic bladder, hematuria: Consulted Dr. Lonna Cobb, urologist, to evaluate patient because of persistent hematuria with blood clots.  S/p suprapubic catheter placement on 03/23/2023 (prior to admission).   H&H is stable   Comorbidities include compensated chronic diastolic CHF, pancreatic insufficiency on Creon     Diet Order             Diet Heart Fluid consistency: Thin  Diet effective now                            Consultants: Urologist  Procedures: None    Medications:    amantadine  100 mg Oral TID   baclofen  10-20 mg Oral TID   enoxaparin (LOVENOX) injection  0.5 mg/kg Subcutaneous Q24H   levETIRAcetam  500 mg Oral BID   lipase/protease/amylase  72,000 Units Oral TID AC   And   lipase/protease/amylase  36,000 Units Oral TID AC   loratadine  10 mg Oral Daily   multivitamin with minerals  1 tablet Oral Daily   oxybutynin  10 mg Oral BID   pantoprazole  40 mg Oral Daily   tiZANidine  4 mg Oral QHS   Continuous Infusions:  cefTRIAXone (ROCEPHIN)  IV       Anti-infectives (From admission, onward)    Start     Dose/Rate Route Frequency Ordered Stop   03/27/23 1500  cefTRIAXone (ROCEPHIN) 2 g in sodium chloride 0.9 % 100 mL IVPB        2 g 200 mL/hr over 30 Minutes Intravenous Daily 03/27/23 1403     03/24/23  2200  meropenem (MERREM) 1 g in sodium chloride 0.9 % 100 mL IVPB  Status:  Discontinued        1 g 200 mL/hr over 30 Minutes Intravenous Every 8 hours 03/24/23 1852 03/27/23 1403   03/24/23 1315  ceFEPIme (MAXIPIME) 2 g in sodium chloride 0.9 % 100 mL IVPB        2 g 200 mL/hr over 30 Minutes Intravenous  Once 03/24/23 1310 03/24/23 1419   03/24/23 1315  metroNIDAZOLE (FLAGYL) IVPB 500 mg        500 mg 100 mL/hr over 60 Minutes Intravenous  Once 03/24/23 1310 03/24/23 1452   03/24/23 1315  vancomycin (VANCOCIN) IVPB 1000 mg/200 mL premix        1,000 mg 200 mL/hr over 60 Minutes  Intravenous  Once 03/24/23 1310 03/24/23 1630              Family Communication/Anticipated D/C date and plan/Code Status   DVT prophylaxis:      Code Status: Full Code  Family Communication: None Disposition Plan: Plan to discharge home   Status is: Inpatient Remains inpatient appropriate because: Sepsis from UTI       Subjective:   Interval events noted.  She complains of cough which sometimes causes urinary incontinence.  She still has bloody urine from the suprapubic catheter.  She noticed some blood clots in the urine this morning.  Objective:    Vitals:   03/28/23 0317 03/28/23 0731 03/28/23 0800 03/28/23 1112  BP: 110/71 133/74 112/63 129/75  Pulse: 92 88 88 97  Resp: 18 18 16 17   Temp: 98.9 F (37.2 C) 98.1 F (36.7 C) 98.9 F (37.2 C) 98.8 F (37.1 C)  TempSrc: Oral  Oral   SpO2: 92% 95%  94%  Weight:      Height:       No data found.  No intake or output data in the 24 hours ending 03/28/23 1208  Filed Weights   03/24/23 1301  Weight: 94.3 kg    Exam:  GEN: NAD SKIN: Warm and dry EYES: No pallor or icterus ENT: MMM CV: RRR PULM: CTA B ABD: soft, ND, NT, +BS CNS: AAO x 3, paraparesis EXT: Trace bilateral leg edema, no erythema or tenderness GU: Suprapubic catheter draining bloody urine into the urine bag     Data Reviewed:   I have personally reviewed following labs and imaging studies:  Labs: Labs show the following:   Basic Metabolic Panel: Recent Labs  Lab 03/24/23 1317 03/25/23 0600 03/26/23 0424  NA 134* 133* 136  K 3.5 3.4* 3.6  CL 100 105 103  CO2 23 21* 23  GLUCOSE 151* 117* 102*  BUN 14 14 11   CREATININE 0.57 0.77 0.54  CALCIUM 8.9 8.5* 8.6*   GFR Estimated Creatinine Clearance: 81.1 mL/min (by C-G formula based on SCr of 0.54 mg/dL). Liver Function Tests: Recent Labs  Lab 03/24/23 1317  AST 30  ALT 32  ALKPHOS 95  BILITOT 1.0  PROT 7.5  ALBUMIN 3.1*   No results for input(s): "LIPASE",  "AMYLASE" in the last 168 hours. No results for input(s): "AMMONIA" in the last 168 hours. Coagulation profile Recent Labs  Lab 03/23/23 1153 03/24/23 1317  INR 1.0 1.2    CBC: Recent Labs  Lab 03/22/23 1011 03/23/23 1153 03/24/23 1317 03/25/23 0600 03/26/23 0424  WBC 4.4 4.4 15.6* 12.7* 7.6  NEUTROABS 2.4  --  14.3*  --  5.6  HGB 11.4* 11.9* 12.6 10.6*  10.8*  HCT 35.5* 36.8 38.9 32.1* 32.0*  MCV 89.9 89.3 89.6 89.4 87.0  PLT 224 209 166 116* 124*   Cardiac Enzymes: No results for input(s): "CKTOTAL", "CKMB", "CKMBINDEX", "TROPONINI" in the last 168 hours. BNP (last 3 results) No results for input(s): "PROBNP" in the last 8760 hours. CBG: Recent Labs  Lab 03/25/23 1227  GLUCAP 136*   D-Dimer: No results for input(s): "DDIMER" in the last 72 hours. Hgb A1c: No results for input(s): "HGBA1C" in the last 72 hours. Lipid Profile: No results for input(s): "CHOL", "HDL", "LDLCALC", "TRIG", "CHOLHDL", "LDLDIRECT" in the last 72 hours. Thyroid function studies: No results for input(s): "TSH", "T4TOTAL", "T3FREE", "THYROIDAB" in the last 72 hours.  Invalid input(s): "FREET3" Anemia work up: No results for input(s): "VITAMINB12", "FOLATE", "FERRITIN", "TIBC", "IRON", "RETICCTPCT" in the last 72 hours. Sepsis Labs: Recent Labs  Lab 03/23/23 1153 03/24/23 1317 03/24/23 2250 03/25/23 0600 03/26/23 0424  WBC 4.4 15.6*  --  12.7* 7.6  LATICACIDVEN  --  1.1 1.4  --   --     Microbiology Recent Results (from the past 240 hours)  Blood Culture (routine x 2)     Status: None (Preliminary result)   Collection Time: 03/24/23  1:21 PM   Specimen: BLOOD LEFT ARM  Result Value Ref Range Status   Specimen Description BLOOD LEFT ARM  Final   Special Requests   Final    BOTTLES DRAWN AEROBIC AND ANAEROBIC Blood Culture results may not be optimal due to an inadequate volume of blood received in culture bottles   Culture   Final    NO GROWTH 4 DAYS Performed at [facility], [address] [place], [place] [address]    Report Status PENDING  Incomplete  Resp panel by RT-PCR (RSV, Flu A&B, Covid) Anterior Nasal Swab     Status: None   Collection Time: 03/24/23  1:30 PM   Specimen: Anterior Nasal Swab  Result Value Ref Range Status   SARS Coronavirus 2 by RT PCR NEGATIVE NEGATIVE Final    Comment: (NOTE) SARS-CoV-2 target nucleic acids are NOT DETECTED.  The SARS-CoV-2 RNA is generally detectable in upper respiratory specimens during the acute phase of infection. The lowest concentration of SARS-CoV-2 viral copies this assay can detect is 138 copies/mL. A negative result does not preclude SARS-Cov-2 infection and should not be used as the sole basis for treatment or other patient management decisions. A negative result may occur with  improper specimen collection/handling, submission of specimen other than nasopharyngeal swab, presence of viral mutation(s) within the areas targeted by this assay, and inadequate number of viral copies(<138 copies/mL). A negative result must be combined with clinical observations, patient history, and epidemiological information. The expected result is Negative.  Fact Sheet for Patients:  BloggerCourse.com  Fact Sheet for Healthcare Providers:  SeriousBroker.it  This test is no t yet approved or cleared by the [place] FDA and  has been authorized for detection and/or diagnosis of SARS-CoV-2 by FDA under an Emergency Use Authorization (EUA). This EUA will remain  in effect (meaning this test can be used) for the duration of the COVID-19 declaration under Section 564(b)(1) of the Act, 21 U.S.C.section 360bbb-3(b)(1), unless the authorization is terminated  or revoked sooner.       Influenza A by PCR NEGATIVE NEGATIVE Final   Influenza B by PCR NEGATIVE NEGATIVE Final    Comment: (NOTE) The Xpert Xpress SARS-CoV-2/FLU/RSV plus assay is intended as  an aid in the  diagnosis of influenza from Nasopharyngeal swab specimens and should not be used as a sole basis for treatment. Nasal washings and aspirates are unacceptable for Xpert Xpress SARS-CoV-2/FLU/RSV testing.  Fact Sheet for Patients: BloggerCourse.com  Fact Sheet for Healthcare Providers: SeriousBroker.it  This test is not yet approved or cleared by the [place] FDA and has been authorized for detection and/or diagnosis of SARS-CoV-2 by FDA under an Emergency Use Authorization (EUA). This EUA will remain in effect (meaning this test can be used) for the duration of the COVID-19 declaration under Section 564(b)(1) of the Act, 21 U.S.C. section 360bbb-3(b)(1), unless the authorization is terminated or revoked.     Resp Syncytial Virus by PCR NEGATIVE NEGATIVE Final    Comment: (NOTE) Fact Sheet for Patients: BloggerCourse.com  Fact Sheet for Healthcare Providers: SeriousBroker.it  This test is not yet approved or cleared by the [place] FDA and has been authorized for detection and/or diagnosis of SARS-CoV-2 by FDA under an Emergency Use Authorization (EUA). This EUA will remain in effect (meaning this test can be used) for the duration of the COVID-19 declaration under Section 564(b)(1) of the Act, 21 U.S.C. section 360bbb-3(b)(1), unless the authorization is terminated or revoked.  Performed at [facility], [address] [place], [place] [address]   Urine Culture     Status: Abnormal   Collection Time: 03/24/23  1:30 PM   Specimen: Urine, Random  Result Value Ref Range Status   Specimen Description   Final    URINE, RANDOM Performed at [facility], [address] [place], [place] [address]    Special Requests   Final    NONE Reflexed from [id number] Performed at [facility], [address] [place], [place] [address]     Culture >=100,000 COLONIES/mL ESCHERICHIA COLI (A)  Final   Report Status 03/26/2023 FINAL  Final   Organism ID, Bacteria ESCHERICHIA COLI (A)  Final      Susceptibility   Escherichia coli - MIC*    AMPICILLIN >=32 RESISTANT Resistant     CEFAZOLIN <=4 SENSITIVE Sensitive     CEFEPIME <=0.12 SENSITIVE Sensitive     CEFTRIAXONE <=0.25 SENSITIVE Sensitive     CIPROFLOXACIN >=4 RESISTANT Resistant     GENTAMICIN <=1 SENSITIVE Sensitive     IMIPENEM <=0.25 SENSITIVE Sensitive     NITROFURANTOIN 32 SENSITIVE Sensitive     TRIMETH/SULFA <=20 SENSITIVE Sensitive     AMPICILLIN/SULBACTAM 8 SENSITIVE Sensitive     PIP/TAZO <=4 SENSITIVE Sensitive ug/mL    * >=100,000 COLONIES/mL ESCHERICHIA COLI  Blood Culture (routine x 2)     Status: Abnormal (Preliminary result)   Collection Time: 03/24/23  1:40 PM   Specimen: BLOOD  Result Value Ref Range Status   Specimen Description   Final    BLOOD BLOOD LEFT ARM Performed at [facility], [address], [place], [place] [address]    Special Requests   Final    BOTTLES DRAWN AEROBIC AND ANAEROBIC Blood Culture adequate volume Performed at [facility], [address] [place], [place] [address]    Culture  Setup Time   Final    GRAM NEGATIVE RODS AEROBIC BOTTLE ONLY CRITICAL RESULT CALLED TO, READ BACK BY AND VERIFIED WITH: CAROLYN COULTER @1837  03/26/23 LFD    Culture (A)  Final    ESCHERICHIA COLI SUSCEPTIBILITIES TO FOLLOW Performed at [facility], [address] [place], [place] [address]    Report Status PENDING  Incomplete  Blood  Culture ID Panel (Reflexed)     Status: Abnormal   Collection Time: 03/24/23  1:40 PM  Result Value Ref Range Status   Enterococcus faecalis NOT DETECTED NOT DETECTED Final   Enterococcus Faecium NOT DETECTED NOT DETECTED Final   Listeria monocytogenes NOT DETECTED NOT DETECTED Final   Staphylococcus species NOT DETECTED NOT DETECTED Final   Staphylococcus aureus (BCID) NOT  DETECTED NOT DETECTED Final   Staphylococcus epidermidis NOT DETECTED NOT DETECTED Final   Staphylococcus lugdunensis NOT DETECTED NOT DETECTED Final   Streptococcus species NOT DETECTED NOT DETECTED Final   Streptococcus agalactiae NOT DETECTED NOT DETECTED Final   Streptococcus pneumoniae NOT DETECTED NOT DETECTED Final   Streptococcus pyogenes NOT DETECTED NOT DETECTED Final   A.calcoaceticus-baumannii NOT DETECTED NOT DETECTED Final   Bacteroides fragilis NOT DETECTED NOT DETECTED Final   Enterobacterales DETECTED (A) NOT DETECTED Final    Comment: Enterobacterales represent a large order of gram negative bacteria, not a single organism. CRITICAL RESULT CALLED TO, READ BACK BY AND VERIFIED WITH: CAROLYN COULTER @ 1837 03/26/23 LFD    Enterobacter cloacae complex NOT DETECTED NOT DETECTED Final   Escherichia coli DETECTED (A) NOT DETECTED Final    Comment: CRITICAL RESULT CALLED TO, READ BACK BY AND VERIFIED WITH: CAROLYN COULTER @ 1837 03/26/23 LFD    Klebsiella aerogenes NOT DETECTED NOT DETECTED Final   Klebsiella oxytoca NOT DETECTED NOT DETECTED Final   Klebsiella pneumoniae NOT DETECTED NOT DETECTED Final   Proteus species NOT DETECTED NOT DETECTED Final   Salmonella species NOT DETECTED NOT DETECTED Final   Serratia marcescens NOT DETECTED NOT DETECTED Final   Haemophilus influenzae NOT DETECTED NOT DETECTED Final   Neisseria meningitidis NOT DETECTED NOT DETECTED Final   Pseudomonas aeruginosa NOT DETECTED NOT DETECTED Final   Stenotrophomonas maltophilia NOT DETECTED NOT DETECTED Final   Candida albicans NOT DETECTED NOT DETECTED Final   Candida auris NOT DETECTED NOT DETECTED Final   Candida glabrata NOT DETECTED NOT DETECTED Final   Candida krusei NOT DETECTED NOT DETECTED Final   Candida parapsilosis NOT DETECTED NOT DETECTED Final   Candida tropicalis NOT DETECTED NOT DETECTED Final   Cryptococcus neoformans/gattii NOT DETECTED NOT DETECTED Final   CTX-M ESBL NOT  DETECTED NOT DETECTED Final   Carbapenem resistance IMP NOT DETECTED NOT DETECTED Final   Carbapenem resistance KPC NOT DETECTED NOT DETECTED Final   Carbapenem resistance NDM NOT DETECTED NOT DETECTED Final   Carbapenem resist OXA 48 LIKE NOT DETECTED NOT DETECTED Final   Carbapenem resistance VIM NOT DETECTED NOT DETECTED Final    Comment: Performed at [facility], [address] [place], [place] [address]    Procedures and diagnostic studies:  No results found.              LOS: 4 days   Rainbow Salman  [facility]   Pager on [email or web address]. If 7PM-7AM, please contact night-coverage at [email or web address]     03/28/2023, 12:08 PM

## 2023-03-28 NOTE — Consult Note (Signed)
Urology Consult  I have been asked to see the patient by Dr. Lurene Shadow, for evaluation and management of gross hematuria.  Chief Complaint: Gross hematuria associated with sepsis secondary to bilateral pyelonephritis.    History of Present Illness: Rebecca [name] is a 63 y.o. year old female with multiple sclerosis, spasmatic paraplegia, bed bound, diastolic CHF, chronic pancreatic insufficiency, lymphedema, breast cancer, endocarditis, seizure and neurogenic bladder which was managed with an indwelling Foley who recently had it converted to an SPT on March 23, 2023 by IR.   The next day she developed abdominal pain, gross hematuria, fever of 103.1 and chills.  Her husband then called EMS and she was transported to the ED.  In the ED, she was found to have leukocytosis with a WBC of 15.6, lactic acid 1.1, positive urinalysis and CT of the abdomen and pelvis noted bilateral pyelonephritis and cystitis.  Her urine culture was positive for E. coli with resistance to both ampicillin and Cipro.  Blood cultures were positive for E.coli.    Rebecca [name] states that earlier today she passed a large blood clot through her vagina and a large volume of urine.    She denies any uncomfortableness at this time.     VSS afebrile.  WBC count 7.6, hemoglobin/hematocrit 10.8/32.0 and serum creatinine 0.54.  Urine output 300 cc yesterday and 150 cc so far this morning.  Past Medical History:  Diagnosis Date   Allergy    Aortic valve disease    Mild AS / AI - most recent Echo demonstrated tricuspid aortic valve.   Bacterial endocarditis    History of .   Bilateral impacted cerumen 06/29/2021   Bilateral lower extremity edema    Noncardiac.  Chronic. LE Venous dopplers - negative for DVT.; Echocardiogram January 2016: Normal EF with normal wall motion and valve function. Only grade 1 diastolic dysfunction. EF 60-65%. Mild MR   Breast cancer (HCC) 12/31/2013   Right breast, 12:00, 1.5 cm, T1c,N0  invasive mammary carcinoma, triple negative. --> Rx with Chemo   Cervical stenosis of spine    GERD (gastroesophageal reflux disease) April or May 2022   Heart murmur 2013   Herpes zoster    IBS (irritable bowel syndrome)    Lymphedema    has legs wrapped at [facility]   Multiple sclerosis (HCC) 2001   Walks from room to room @ home; but Wheelchair when going out.   Neuromuscular disorder (HCC)    MS   PONV (postoperative nausea and vomiting)    Related to Fentanyl   Seizures (HCC)    Takes Keppra   SOB (shortness of breath) 03/01/2013   Syncope and collapse    Urinary tract infection associated with indwelling urethral catheter (HCC) 02/17/2020    Past Surgical History:  Procedure Laterality Date   ANKLE SURGERY     Left   ANKLE SURGERY     ANTERIOR CERVICAL DECOMP/DISCECTOMY FUSION  11/17/2011   Procedure: ANTERIOR CERVICAL DECOMPRESSION/DISCECTOMY FUSION 2 LEVELS;  Surgeon: Maeola Harman, MD;  Location: [facility];  Service: Neurosurgery;  Laterality: N/A;  Cervical Five-Six Six-Seven Anterior cervical decompression/diskectomy/fusion   BREAST BIOPSY Right 12/31/2013   invasive mammary   BREAST SURGERY Right 02/03/2014   Right simple mastectomy with sentinel node biopsy.   CHOLECYSTECTOMY     COLONOSCOPY  2014   ESOPHAGOGASTRODUODENOSCOPY (EGD) WITH PROPOFOL N/A 07/16/2020   Procedure: ESOPHAGOGASTRODUODENOSCOPY (EGD) WITH PROPOFOL;  Surgeon: Pasty Spillers, MD;  Location: [facility];  Service: Endoscopy;  Laterality: N/A;  Patient has MS and will need assistance   HYSTEROSCOPY WITH D & C N/A 11/27/2018   Procedure: DILATATION AND CURETTAGE /HYSTEROSCOPY;  Surgeon: Nadara Mustard, MD;  Location: [facility] ORS;  Service: Gynecology;  Laterality: N/A;   IR ASPIRATION BLADDER INSERT SUPRAPUBIC CATHETER  03/23/2023   Lower extremity venous Dopplers  02/27/2013   No LE DVT   MASTECTOMY Right 2015   ORIF TIBIA PLATEAU Left 12/09/2020   Procedure: LEFT OPEN REDUCTION INTERNAL  FIXATION (ORIF) TIBIAL PLATEAU;  Surgeon: Roby Lofts, MD;  Location: [facility];  Service: Orthopedics;  Laterality: Left;   Port a cath insertion Right 01/19/2010   PORT-A-CATH REMOVAL     right   PORT-A-CATH REMOVAL Right 09/03/2013   Procedure: REMOVAL PORT-A-CATH;  Surgeon: Fransisco Hertz, MD;  Location: [facility];  Service: Vascular;  Laterality: Right;   SPINE SURGERY  Cervical steinois Dr. Venetia Maxon 2014 I think?   TONSILLECTOMY     TRANSTHORACIC ECHOCARDIOGRAM  03/2013; 02/2014   a) Normal LV size and function with EF 60-65%.; Cannot exclude bicuspid aortic valve with mild AS and mild AI.; b) Normal EF with normal wall motion and valve function x Mild MR. G2 DD. EF 60-65%. Tricuspid AoV   UPPER GI ENDOSCOPY  2014    Home Medications:  No current facility-administered medications on file prior to encounter.   Current Outpatient Medications on File Prior to Encounter  Medication Sig Dispense Refill   amantadine (SYMMETREL) 100 MG capsule TAKE 1 CAPSULE BY MOUTH THREE TIMES A DAY 270 capsule 1   baclofen (LIORESAL) 10 MG tablet Take 1-2 tablets (10-20 mg total) by mouth 3 (three) times daily. 120 tablet 12   gluconic acid-citric acid (RENACIDIN) irrigation Insert 30 mL into suprapubic tube and clamp tube for 10 minutes and then drain twice daily 500 mL 1   Glucosamine-Chondroitin (COSAMIN DS PO) Take 1 tablet by mouth 3 (three) times daily.     levETIRAcetam (KEPPRA) 500 MG tablet Take 1 tablet (500 mg total) by mouth 2 (two) times daily. 180 tablet 4   lipase/protease/amylase (CREON) 36000 UNITS CPEP capsule Take 2 capsules (72,000 Units total) by mouth 3 (three) times daily with meals AND 1 capsule (36,000 Units total) with snacks. (Patient taking differently: Take 1-2 capsules (36,000- 72,000 Units total) by mouth with (three) times daily with meals AND 1 capsule (36,000 Units total) with snacks. (1 capsule by mouth with breakfast, 2 capsules by mouth with lunch and 1 capsule by mouth with dinner)   ) 270 capsule 11   loratadine (CLARITIN) 10 MG tablet Take 10 mg by mouth daily.     Misc Natural Products (LEG VEIN & CIRCULATION) TABS Take 1 tablet by mouth 2 (two) times daily.      Multiple Vitamins-Minerals (MULTIVITAMIN PO) Take 1 tablet by mouth daily.      nystatin (MYCOSTATIN/NYSTOP) powder Apply 1 application topically 3 (three) times daily as needed. For rash/raw skin as needed 15 g 2   omeprazole (PRILOSEC) 40 MG capsule TAKE 1 CAPSULE (40 MG TOTAL) BY MOUTH DAILY. 90 capsule 1   oxybutynin (DITROPAN-XL) 10 MG 24 hr tablet Take 1 tablet (10 mg total) by mouth 2 (two) times daily. 120 tablet 3   REBIF REBIDOSE 44 MCG/0.5ML SOAJ INJECT 1 PEN UNDER THE SKIN 3 TIMES PER WEEK 44 mL 2   tiZANidine (ZANAFLEX) 4 MG tablet Take 1 tablet (4 mg total) by mouth at bedtime. 90 tablet 4  Turmeric 500 MG CAPS Take 500 mg by mouth daily.       Allergies:  Allergies  Allergen Reactions   Fentanyl Nausea And Vomiting and Nausea Only    vomiting Was given in PACU x3 each time patient got sick    Sulfa Antibiotics Hives and Other (See Comments)    Light headed, over heated    Family History  Problem Relation Age of Onset   Cancer Father        skin   Heart disease Father    Heart attack Father        heart attack in his 67's   Thyroid disease Sister    Ovarian cancer Cousin    Breast cancer Maternal Aunt 83   Breast cancer Maternal Grandmother 36   Bladder Cancer Neg Hx    Kidney cancer Neg Hx     Social History:  reports that she has never smoked. She has never used smokeless tobacco. She reports that she does not drink alcohol and does not use drugs.  ROS: A complete review of systems was performed.  All systems are negative except for pertinent findings as noted.  Physical Exam:  Vital signs in last 24 hours: Temp:  [98.1 F (36.7 C)-101 F (38.3 C)] 98.8 F (37.1 C) (02/18 1112) Pulse Rate:  [88-105] 97 (02/18 1112) Resp:  [16-20] 17 (02/18 1112) BP:  (110-170)/(63-83) 129/75 (02/18 1112) SpO2:  [90 %-95 %] 94 % (02/18 1112) Constitutional:  Alert and oriented, No acute distress HEENT: [place] AT, moist mucus membranes.  Trachea midline, no masses Cardiovascular: Regular rate and rhythm, no clubbing, cyanosis, or edema. Respiratory: Normal respiratory effort, lungs clear bilaterally GI: Abdomen is soft, nontender, nondistended, no abdominal masses GU: No CVA tenderness.  SPT in place.  Dressing at the insertion site is clean and dry.  Urine from the SPT tube is clear yellow.  Urine in the leg bag is translucent red-brown with no clots. Skin: No rashes, bruises or suspicious lesions Lymph: Chronic lymphedema both legs  Neurologic: Grossly intact, bed bound  Psychiatric: Normal mood and affect   Laboratory Data:  Recent Labs    03/26/23 0424  WBC 7.6  HGB 10.8*  HCT 32.0*   Recent Labs    03/26/23 0424  NA 136  K 3.6  CL 103  CO2 23  GLUCOSE 102*  BUN 11  CREATININE 0.54  CALCIUM 8.6*   No results for input(s): "LABPT", "INR" in the last 72 hours. No results for input(s): "LABURIN" in the last 72 hours. Results for orders placed or performed during the hospital encounter of 03/24/23  Blood Culture (routine x 2)     Status: None (Preliminary result)   Collection Time: 03/24/23  1:21 PM   Specimen: BLOOD LEFT ARM  Result Value Ref Range Status   Specimen Description BLOOD LEFT ARM  Final   Special Requests   Final    BOTTLES DRAWN AEROBIC AND ANAEROBIC Blood Culture results may not be optimal due to an inadequate volume of blood received in culture bottles   Culture   Final    NO GROWTH 4 DAYS Performed at [facility], [address] [place], [place] [address]    Report Status PENDING  Incomplete  Resp panel by RT-PCR (RSV, Flu A&B, Covid) Anterior Nasal Swab     Status: None   Collection Time: 03/24/23  1:30 PM   Specimen: Anterior Nasal Swab  Result Value Ref Range Status   SARS Coronavirus  2 by RT PCR  NEGATIVE NEGATIVE Final    Comment: (NOTE) SARS-CoV-2 target nucleic acids are NOT DETECTED.  The SARS-CoV-2 RNA is generally detectable in upper respiratory specimens during the acute phase of infection. The lowest concentration of SARS-CoV-2 viral copies this assay can detect is 138 copies/mL. A negative result does not preclude SARS-Cov-2 infection and should not be used as the sole basis for treatment or other patient management decisions. A negative result may occur with  improper specimen collection/handling, submission of specimen other than nasopharyngeal swab, presence of viral mutation(s) within the areas targeted by this assay, and inadequate number of viral copies(<138 copies/mL). A negative result must be combined with clinical observations, patient history, and epidemiological information. The expected result is Negative.  Fact Sheet for Patients:  BloggerCourse.com  Fact Sheet for Healthcare Providers:  SeriousBroker.it  This test is no t yet approved or cleared by the [place] FDA and  has been authorized for detection and/or diagnosis of SARS-CoV-2 by FDA under an Emergency Use Authorization (EUA). This EUA will remain  in effect (meaning this test can be used) for the duration of the COVID-19 declaration under Section 564(b)(1) of the Act, 21 U.S.C.section 360bbb-3(b)(1), unless the authorization is terminated  or revoked sooner.       Influenza A by PCR NEGATIVE NEGATIVE Final   Influenza B by PCR NEGATIVE NEGATIVE Final    Comment: (NOTE) The Xpert Xpress SARS-CoV-2/FLU/RSV plus assay is intended as an aid in the diagnosis of influenza from Nasopharyngeal swab specimens and should not be used as a sole basis for treatment. Nasal washings and aspirates are unacceptable for Xpert Xpress SARS-CoV-2/FLU/RSV testing.  Fact Sheet for Patients: BloggerCourse.com  Fact Sheet for  Healthcare Providers: SeriousBroker.it  This test is not yet approved or cleared by the [place] FDA and has been authorized for detection and/or diagnosis of SARS-CoV-2 by FDA under an Emergency Use Authorization (EUA). This EUA will remain in effect (meaning this test can be used) for the duration of the COVID-19 declaration under Section 564(b)(1) of the Act, 21 U.S.C. section 360bbb-3(b)(1), unless the authorization is terminated or revoked.     Resp Syncytial Virus by PCR NEGATIVE NEGATIVE Final    Comment: (NOTE) Fact Sheet for Patients: BloggerCourse.com  Fact Sheet for Healthcare Providers: SeriousBroker.it  This test is not yet approved or cleared by the [place] FDA and has been authorized for detection and/or diagnosis of SARS-CoV-2 by FDA under an Emergency Use Authorization (EUA). This EUA will remain in effect (meaning this test can be used) for the duration of the COVID-19 declaration under Section 564(b)(1) of the Act, 21 U.S.C. section 360bbb-3(b)(1), unless the authorization is terminated or revoked.  Performed at [facility], [address] [place], [place] [address]   Urine Culture     Status: Abnormal   Collection Time: 03/24/23  1:30 PM   Specimen: Urine, Random  Result Value Ref Range Status   Specimen Description   Final    URINE, RANDOM Performed at [facility], [address] [place], [place] [address]    Special Requests   Final    NONE Reflexed from [phone] Performed at [facility], [address] [place], [place] [address]    Culture >=100,000 COLONIES/mL ESCHERICHIA COLI (A)  Final   Report Status 03/26/2023 FINAL  Final   Organism ID, Bacteria ESCHERICHIA COLI (A)  Final      Susceptibility   Escherichia coli - MIC*  AMPICILLIN >=32 RESISTANT Resistant     CEFAZOLIN <=4 SENSITIVE Sensitive     CEFEPIME <=0.12  SENSITIVE Sensitive     CEFTRIAXONE <=0.25 SENSITIVE Sensitive     CIPROFLOXACIN >=4 RESISTANT Resistant     GENTAMICIN <=1 SENSITIVE Sensitive     IMIPENEM <=0.25 SENSITIVE Sensitive     NITROFURANTOIN 32 SENSITIVE Sensitive     TRIMETH/SULFA <=20 SENSITIVE Sensitive     AMPICILLIN/SULBACTAM 8 SENSITIVE Sensitive     PIP/TAZO <=4 SENSITIVE Sensitive ug/mL    * >=100,000 COLONIES/mL ESCHERICHIA COLI  Blood Culture (routine x 2)     Status: Abnormal (Preliminary result)   Collection Time: 03/24/23  1:40 PM   Specimen: BLOOD  Result Value Ref Range Status   Specimen Description   Final    BLOOD BLOOD LEFT ARM Performed at [facility], [address] [place], [place] [address]    Special Requests   Final    BOTTLES DRAWN AEROBIC AND ANAEROBIC Blood Culture adequate volume Performed at [facility], [address] [place], [place] [address]    Culture  Setup Time   Final    GRAM NEGATIVE RODS AEROBIC BOTTLE ONLY CRITICAL RESULT CALLED TO, READ BACK BY AND VERIFIED WITH: CAROLYN COULTER @1837  03/26/23 LFD    Culture (A)  Final    ESCHERICHIA COLI SUSCEPTIBILITIES TO FOLLOW Performed at [facility], [address] [place], [place] [address]    Report Status PENDING  Incomplete  Blood Culture ID Panel (Reflexed)     Status: Abnormal   Collection Time: 03/24/23  1:40 PM  Result Value Ref Range Status   Enterococcus faecalis NOT DETECTED NOT DETECTED Final   Enterococcus Faecium NOT DETECTED NOT DETECTED Final   Listeria monocytogenes NOT DETECTED NOT DETECTED Final   Staphylococcus species NOT DETECTED NOT DETECTED Final   Staphylococcus aureus (BCID) NOT DETECTED NOT DETECTED Final   Staphylococcus epidermidis NOT DETECTED NOT DETECTED Final   Staphylococcus lugdunensis NOT DETECTED NOT DETECTED Final   Streptococcus species NOT DETECTED NOT DETECTED Final   Streptococcus agalactiae NOT DETECTED NOT DETECTED Final   Streptococcus pneumoniae NOT  DETECTED NOT DETECTED Final   Streptococcus pyogenes NOT DETECTED NOT DETECTED Final   A.calcoaceticus-baumannii NOT DETECTED NOT DETECTED Final   Bacteroides fragilis NOT DETECTED NOT DETECTED Final   Enterobacterales DETECTED (A) NOT DETECTED Final    Comment: Enterobacterales represent a large order of gram negative bacteria, not a single organism. CRITICAL RESULT CALLED TO, READ BACK BY AND VERIFIED WITH: CAROLYN COULTER @ 1837 03/26/23 LFD    Enterobacter cloacae complex NOT DETECTED NOT DETECTED Final   Escherichia coli DETECTED (A) NOT DETECTED Final    Comment: CRITICAL RESULT CALLED TO, READ BACK BY AND VERIFIED WITH: CAROLYN COULTER @ 1837 03/26/23 LFD    Klebsiella aerogenes NOT DETECTED NOT DETECTED Final   Klebsiella oxytoca NOT DETECTED NOT DETECTED Final   Klebsiella pneumoniae NOT DETECTED NOT DETECTED Final   Proteus species NOT DETECTED NOT DETECTED Final   Salmonella species NOT DETECTED NOT DETECTED Final   Serratia marcescens NOT DETECTED NOT DETECTED Final   Haemophilus influenzae NOT DETECTED NOT DETECTED Final   Neisseria meningitidis NOT DETECTED NOT DETECTED Final   Pseudomonas aeruginosa NOT DETECTED NOT DETECTED Final   Stenotrophomonas maltophilia NOT DETECTED NOT DETECTED Final   Candida albicans NOT DETECTED NOT DETECTED Final   Candida auris NOT DETECTED NOT DETECTED Final   Candida glabrata NOT DETECTED NOT DETECTED Final   Candida  krusei NOT DETECTED NOT DETECTED Final   Candida parapsilosis NOT DETECTED NOT DETECTED Final   Candida tropicalis NOT DETECTED NOT DETECTED Final   Cryptococcus neoformans/gattii NOT DETECTED NOT DETECTED Final   CTX-M ESBL NOT DETECTED NOT DETECTED Final   Carbapenem resistance IMP NOT DETECTED NOT DETECTED Final   Carbapenem resistance KPC NOT DETECTED NOT DETECTED Final   Carbapenem resistance NDM NOT DETECTED NOT DETECTED Final   Carbapenem resist OXA 48 LIKE NOT DETECTED NOT DETECTED Final   Carbapenem resistance  VIM NOT DETECTED NOT DETECTED Final    Comment: Performed at [facility], [address], [place], [place] [address]   *Note: Due to a large number of results and/or encounters for the requested time period, some results have not been displayed. A complete set of results can be found in Results Review.     Radiologic Imaging: Narrative & Impression  CLINICAL DATA:  Lower abdominal pain, fever, tachycardia, sepsis. Chest had suprapubic catheter placed yesterday. Evaluating for infection   EXAM: CT ABDOMEN AND PELVIS WITH CONTRAST   TECHNIQUE: Multidetector CT imaging of the abdomen and pelvis was performed using the standard protocol following bolus administration of intravenous contrast.   RADIATION DOSE REDUCTION: This exam was performed according to the departmental dose-optimization program which includes automated exposure control, adjustment of the mA and/or kV according to patient size and/or use of iterative reconstruction technique.   CONTRAST:  OMNIPAQUE IOHEXOL 300 MG/ML  SOLN   COMPARISON:  CT abdomen pelvis 03/08/2023   FINDINGS: Lower chest: Small to moderate volume hiatal hernia.   Hepatobiliary: No focal liver abnormality. Status post cholecystectomy. No biliary dilatation.   Pancreas: No focal lesion. Normal pancreatic contour. No surrounding inflammatory changes. No main pancreatic ductal dilatation.   Spleen: Normal in size without focal abnormality.   Adrenals/Urinary Tract:   No adrenal nodule bilaterally.   Striated nephrograms bilaterally and bilateral urothelial thickening.   No hydronephrosis. No hydroureter.  No nephroureterolithiasis.   The urinary bladder is decompressed with suprapubic catheter terminating within its lumen. Urinary bladder wall thickening and perivesicular fat stranding.   On delayed imaging, there is no urothelial wall thickening and there are no filling defects in the opacified portions of the  bilateral collecting systems or ureters.   Stomach/Bowel: Stomach is within normal limits. No evidence of bowel wall thickening or dilatation. Stool throughout the colon. Appendix appears normal.   Vascular/Lymphatic: No abdominal aorta or iliac aneurysm. Moderate to severe atherosclerotic plaque of the aorta and its branches. No abdominal, pelvic, or inguinal lymphadenopathy.   Reproductive: The uterus is lobulated and heterogeneous suggestive of underlying uterine fibroids. Uterus and bilateral adnexa are unremarkable.   Other: No intraperitoneal free fluid. No intraperitoneal free gas. No organized fluid collection.   Musculoskeletal:   No abdominal wall hernia or abnormality.  Diastasis rectus   No suspicious lytic or blastic osseous lesions. No acute displaced fracture. Multilevel degenerative changes of the spine.   IMPRESSION: 1. Bilateral pyelonephritis and cystitis. 2. Constipation. 3. Small to moderate volume hiatal hernia.     Electronically Signed   By: Tish Frederickson M.D.   On: 03/24/2023 17:29    I have independently reviewed the films.  See HPI.    Impression/Assessment and Plan:  1. Gross hematuria -likely secondary to newly placed SPT and infection -urine is clearing and hemoglobin/hematocrit have been stable during this admission  -urine is clear yellow in the SPT, so hematuria seems to be resolving  -hopefully, she  passed any clot material from her bladder this am, but I am ordering a pelvic ultrasound to assess for any further clot burden as her UOP over the last two days has been significantly reduced compared to the UOP at the time of admission  2. Sepsis -antibiotics per primary team   3.  Neurogenic bladder -Patient recently transitioned from indwelling Foley to SPT -Will need to have the 16 fr suprapubic catheter exchanged by IR to a 16 [place] council tip Foley so that I can be exchanged in the office and this will usually be done in 6 to 8  weeks   03/28/2023, 1:56 PM  Anikah Hogge, PA-C

## 2023-03-28 NOTE — Plan of Care (Signed)
  Problem: Education: Goal: Knowledge of General Education information will improve Description: Including pain rating scale, medication(s)/side effects and non-pharmacologic comfort measures Outcome: Progressing   Problem: Clinical Measurements: Goal: Ability to maintain clinical measurements within normal limits will improve Outcome: Progressing Goal: Respiratory complications will improve Outcome: Progressing Goal: Cardiovascular complication will be avoided Outcome: Progressing   Problem: Nutrition: Goal: Adequate nutrition will be maintained Outcome: Progressing   Problem: Coping: Goal: Level of anxiety will decrease Outcome: Progressing   Problem: Pain Managment: Goal: General experience of comfort will improve and/or be controlled Outcome: Progressing   Problem: Safety: Goal: Ability to remain free from injury will improve Outcome: Progressing

## 2023-03-28 NOTE — TOC Progression Note (Signed)
Transition of Care [facility]) - Progression Note    Patient Details  Name: Rebecca [name] MRN: [id number] Date of Birth: Sep 23, 1960  Transition of [facility]) CM/SW Contact  Truddie Hidden, RN Phone Number: 03/28/2023, 10:25 AM  Clinical Narrative:    TOC continuing to follow patient's progress throughout discharge planning.        Expected Discharge Plan and Services                                               Social Determinants of Health (SDOH) Interventions SDOH Screenings   Food Insecurity: No Food Insecurity (03/25/2023)  Housing: Low Risk  (03/25/2023)  Transportation Needs: Unmet Transportation Needs (03/25/2023)  Utilities: Not At Risk (03/25/2023)  Alcohol Screen: Low Risk  (08/17/2021)  Depression (PHQ2-9): Low Risk  (02/20/2023)  Financial Resource Strain: Low Risk  (12/03/2022)  Physical Activity: Inactive (12/03/2022)  Social Connections: Moderately Isolated (03/14/2023)  Stress: No Stress Concern Present (12/03/2022)  Tobacco Use: Low Risk  (03/24/2023)  Health Literacy: Adequate Health Literacy (09/29/2022)    Readmission Risk Interventions     No data to display

## 2023-03-28 NOTE — Progress Notes (Signed)
Urology follow up note: Pelvic ultrasound did not demonstrate any significant clot burden.  Hematuria is clearing and UOP has picked up.  Rebecca [name] will need outpatient follow up with urology to arrange exchange of SPT to council tip with IR and obtain pre procedural urine cultures.  We will call Rebecca [name] to arrange follow up.   Rebecca Veltri, PA-C

## 2023-03-28 NOTE — Telephone Encounter (Signed)
Would you schedule a follow up appointment for Rebecca [name] with me in 2 to 3 weeks for hospital follow up?

## 2023-03-29 DIAGNOSIS — N820 Vesicovaginal fistula: Secondary | ICD-10-CM

## 2023-03-29 DIAGNOSIS — R7881 Bacteremia: Secondary | ICD-10-CM | POA: Diagnosis not present

## 2023-03-29 DIAGNOSIS — T8463XA Infection and inflammatory reaction due to internal fixation device of spine, initial encounter: Secondary | ICD-10-CM

## 2023-03-29 DIAGNOSIS — N319 Neuromuscular dysfunction of bladder, unspecified: Secondary | ICD-10-CM | POA: Diagnosis not present

## 2023-03-29 DIAGNOSIS — G35 Multiple sclerosis: Secondary | ICD-10-CM | POA: Diagnosis not present

## 2023-03-29 DIAGNOSIS — N029 Recurrent and persistent hematuria with unspecified morphologic changes: Secondary | ICD-10-CM | POA: Diagnosis not present

## 2023-03-29 DIAGNOSIS — N390 Urinary tract infection, site not specified: Secondary | ICD-10-CM | POA: Diagnosis not present

## 2023-03-29 DIAGNOSIS — Z9359 Other cystostomy status: Secondary | ICD-10-CM

## 2023-03-29 DIAGNOSIS — E669 Obesity, unspecified: Secondary | ICD-10-CM | POA: Diagnosis not present

## 2023-03-29 DIAGNOSIS — S3729XA Other injury of bladder, initial encounter: Secondary | ICD-10-CM

## 2023-03-29 DIAGNOSIS — B9620 Unspecified Escherichia coli [E. coli] as the cause of diseases classified elsewhere: Secondary | ICD-10-CM | POA: Diagnosis not present

## 2023-03-29 DIAGNOSIS — B954 Other streptococcus as the cause of diseases classified elsewhere: Secondary | ICD-10-CM

## 2023-03-29 LAB — CULTURE, BLOOD (ROUTINE X 2)
Culture: NO GROWTH
Special Requests: ADEQUATE

## 2023-03-29 LAB — CBC
HCT: 30 % — ABNORMAL LOW (ref 36.0–46.0)
Hemoglobin: 10.1 g/dL — ABNORMAL LOW (ref 12.0–15.0)
MCH: 29.1 pg (ref 26.0–34.0)
MCHC: 33.7 g/dL (ref 30.0–36.0)
MCV: 86.5 fL (ref 80.0–100.0)
Platelets: 158 10*3/uL (ref 150–400)
RBC: 3.47 MIL/uL — ABNORMAL LOW (ref 3.87–5.11)
RDW: 13.6 % (ref 11.5–15.5)
WBC: 3.3 10*3/uL — ABNORMAL LOW (ref 4.0–10.5)
nRBC: 0 % (ref 0.0–0.2)

## 2023-03-29 NOTE — Consult Note (Signed)
NAME: Rebecca [name]  DOB: [birth year]  MRN: [id number]  Date/Time: 03/29/2023 12:33 PM  REQUESTING PROVIDER: Dr.Sona Allena Katz Subjective:  REASON FOR CONSULT: Ecoli bacteremia ? Rebecca [name] is a 63 y.o. with a history of multiple sclerosis, spastic paraplegia, neurogenic bladder  Foley catheter since December 2019, bilateral lower extremity lymphedema, right breast carcinoma status post mastectomy and chemotherapy, chronic pancreatic insufficeincy, cervical fusion surgery was recently in hospital 1/19-1/23 for Pyelonephritis, ecoli bacteremia and providencia in urine culture and after initial IV was discharged on PO levaquin for 5 more days, underwent SPC on 2/13 by IR and initially during the procedure she had a large bowl movt and they had to halt the procedure and also the foley ballon ruptured and came out. They cleaned the patient reinserted the foley cather under sterile condition . The patient called the next day c/o severe abdominal pain feeling hot with fever of 103 and blood in the bag= They called EMS and brought her to the ED Vitals in the ED  03/24/23 13:03  BP 146/88 !  Temp 98.6 F (37 C)  Pulse Rate 120 !  Resp 16  SpO2 97 %  And then   03/24/23 16:00  Temp 103.1 F (39.5 C) !  Pulse Rate 129 !  Resp 31 (H)  SpO2 100 %  Labs  Latest Reference Range & Units 03/24/23 13:17  WBC 4.0 - 10.5 K/uL 15.6 (H)  Hemoglobin 12.0 - 15.0 g/dL 40.9  HCT 81.1 - 91.4 % 38.9  Platelets 150 - 400 K/uL 166  Creatinine 0.44 - 1.00 mg/dL 7.82   Blood culture sent CT abdomen revealed  urinary bladder  decompressed with suprapubic catheter terminating within its lumen. Urinary bladder wall thickening and perivesicular fat stranding. B/l pyleonpehritis Started on broad spectrum initially and narrowed to ceftriaoxne once blood culture came positive for Ecoli ( not ESBL) I am asked to see the patient for the same She has persistent hematuria and clots Urology has been consulted Pt says she  is experience blood clots from her vagina and also urine for her vagina  Past Medical History:  Diagnosis Date   Allergy    Aortic valve disease    Mild AS / AI - most recent Echo demonstrated tricuspid aortic valve.   Bacterial endocarditis    History of .   Bilateral impacted cerumen 06/29/2021   Bilateral lower extremity edema    Noncardiac.  Chronic. LE Venous dopplers - negative for DVT.; Echocardiogram January 2016: Normal EF with normal wall motion and valve function. Only grade 1 diastolic dysfunction. EF 60-65%. Mild MR   Breast cancer (HCC) 12/31/2013   Right breast, 12:00, 1.5 cm, T1c,N0 invasive mammary carcinoma, triple negative. --> Rx with Chemo   Cervical stenosis of spine    GERD (gastroesophageal reflux disease) April or May 2022   Heart murmur 2013   Herpes zoster    IBS (irritable bowel syndrome)    Lymphedema    has legs wrapped at [facility]   Multiple sclerosis (HCC) 2001   Walks from room to room @ home; but Wheelchair when going out.   Neuromuscular disorder (HCC)    MS   PONV (postoperative nausea and vomiting)    Related to Fentanyl   Seizures (HCC)    Takes Keppra   SOB (shortness of breath) 03/01/2013   Syncope and collapse    Urinary tract infection associated with indwelling urethral catheter (HCC) 02/17/2020    Past Surgical History:  Procedure Laterality Date  ANKLE SURGERY     Left   ANKLE SURGERY     ANTERIOR CERVICAL DECOMP/DISCECTOMY FUSION  11/17/2011   Procedure: ANTERIOR CERVICAL DECOMPRESSION/DISCECTOMY FUSION 2 LEVELS;  Surgeon: Maeola Harman, MD;  Location: [facility];  Service: Neurosurgery;  Laterality: N/A;  Cervical Five-Six Six-Seven Anterior cervical decompression/diskectomy/fusion   BREAST BIOPSY Right 12/31/2013   invasive mammary   BREAST SURGERY Right 02/03/2014   Right simple mastectomy with sentinel node biopsy.   CHOLECYSTECTOMY     COLONOSCOPY  2014   ESOPHAGOGASTRODUODENOSCOPY (EGD) WITH PROPOFOL N/A 07/16/2020    Procedure: ESOPHAGOGASTRODUODENOSCOPY (EGD) WITH PROPOFOL;  Surgeon: Pasty Spillers, MD;  Location: [facility] ENDOSCOPY;  Service: Endoscopy;  Laterality: N/A;  Patient has MS and will need assistance   HYSTEROSCOPY WITH D & C N/A 11/27/2018   Procedure: DILATATION AND CURETTAGE /HYSTEROSCOPY;  Surgeon: Nadara Mustard, MD;  Location: [facility] ORS;  Service: Gynecology;  Laterality: N/A;   IR ASPIRATION BLADDER INSERT SUPRAPUBIC CATHETER  03/23/2023   Lower extremity venous Dopplers  02/27/2013   No LE DVT   MASTECTOMY Right 2015   ORIF TIBIA PLATEAU Left 12/09/2020   Procedure: LEFT OPEN REDUCTION INTERNAL FIXATION (ORIF) TIBIAL PLATEAU;  Surgeon: Roby Lofts, MD;  Location: [facility] OR;  Service: Orthopedics;  Laterality: Left;   Port a cath insertion Right 01/19/2010   PORT-A-CATH REMOVAL     right   PORT-A-CATH REMOVAL Right 09/03/2013   Procedure: REMOVAL PORT-A-CATH;  Surgeon: Fransisco Hertz, MD;  Location: [facility] OR;  Service: Vascular;  Laterality: Right;   SPINE SURGERY  Cervical steinois Dr. Venetia Maxon 2014 I think?   TONSILLECTOMY     TRANSTHORACIC ECHOCARDIOGRAM  03/2013; 02/2014   a) Normal LV size and function with EF 60-65%.; Cannot exclude bicuspid aortic valve with mild AS and mild AI.; b) Normal EF with normal wall motion and valve function x Mild MR. G2 DD. EF 60-65%. Tricuspid AoV   UPPER GI ENDOSCOPY  2014    Social History   Socioeconomic History   Marital status: Married    Spouse name: don   Number of children: 0   Years of education: 12   Highest education level: 12th grade  Occupational History   Occupation: disability  Tobacco Use   Smoking status: Never   Smokeless tobacco: Never   Tobacco comments:    Never smoked. My mom smoked. Passed 04/18/20. Dad smoked, quit 1973. Sister smokes.  Vaping Use   Vaping status: Never Used  Substance and Sexual Activity   Alcohol use: No   Drug use: No   Sexual activity: Not Currently    Partners: Male    Birth control/protection:  None  Other Topics Concern   Not on file  Social History Narrative   She is married. Recently moved back to [place] after being in [place] for some time. She is accompanied by her husband and aunt.Never smoked. Never used alcohol.      1/29: husband currently in SNF in [place]. Aunt lives with her but unable to help care for her. Home health comes in once daily to change her. She does not get out of her recliner per patient   Social Drivers of Health   Financial Resource Strain: Low Risk  (12/03/2022)   Overall Financial Resource Strain (CARDIA)    Difficulty of Paying Living Expenses: Not hard at all  Food Insecurity: No Food Insecurity (03/25/2023)   Hunger Vital Sign    Worried About Programme researcher, broadcasting/film/video in  the Last Year: Never true    Ran Out of Food in the Last Year: Never true  Transportation Needs: Unmet Transportation Needs (03/25/2023)   PRAPARE - Transportation    Lack of Transportation (Medical): Yes    Lack of Transportation (Non-Medical): Yes  Physical Activity: Inactive (12/03/2022)   Exercise Vital Sign    Days of Exercise per Week: 0 days    Minutes of Exercise per Session: 30 min  Stress: No Stress Concern Present (12/03/2022)   [employer] of Occupational Health - Occupational Stress Questionnaire    Feeling of Stress : Not at all  Social Connections: Moderately Isolated (03/14/2023)   Social Connection and Isolation Panel [NHANES]    Frequency of Communication with Friends and Family: More than three times a week    Frequency of Social Gatherings with Friends and Family: More than three times a week    Attends Religious Services: Never    Database administrator or Organizations: No    Attends Banker Meetings: Never    Marital Status: Married  Catering manager Violence: Not At Risk (03/25/2023)   Humiliation, Afraid, Rape, and Kick questionnaire    Fear of Current or Ex-Partner: No    Emotionally Abused: No    Physically Abused:  No    Sexually Abused: No    Family History  Problem Relation Age of Onset   Cancer Father        skin   Heart disease Father    Heart attack Father        heart attack in his 82's   Thyroid disease Sister    Ovarian cancer Cousin    Breast cancer Maternal Aunt 60   Breast cancer Maternal Grandmother 74   Bladder Cancer Neg Hx    Kidney cancer Neg Hx    Allergies  Allergen Reactions   Fentanyl Nausea And Vomiting and Nausea Only    vomiting Was given in PACU x3 each time patient got sick    Sulfa Antibiotics Hives and Other (See Comments)    Light headed, over heated   I? Current Facility-Administered Medications  Medication Dose Route Frequency Provider Last Rate Last Admin   acetaminophen (TYLENOL) tablet 650 mg  650 mg Oral Q6H PRN Lorretta Harp, MD   650 mg at 03/26/23 1433   amantadine (SYMMETREL) capsule 100 mg  100 mg Oral TID Lorretta Harp, MD   100 mg at 03/29/23 0842   baclofen (LIORESAL) tablet 10-20 mg  10-20 mg Oral TID Lorretta Harp, MD   10 mg at 03/29/23 0842   cefTRIAXone (ROCEPHIN) 2 g in sodium chloride 0.9 % 100 mL IVPB  2 g Intravenous Q1400 Lurene Shadow, MD   Stopped at 03/28/23 1639   guaiFENesin-dextromethorphan (ROBITUSSIN DM) 100-10 MG/5ML syrup 5 mL  5 mL Oral Q4H PRN Lurene Shadow, MD   5 mL at 03/28/23 1847   levETIRAcetam (KEPPRA) tablet 500 mg  500 mg Oral BID Lorretta Harp, MD   500 mg at 03/29/23 5784   lipase/protease/amylase (CREON) capsule 72,000 Units  72,000 Units Oral TID Langley Gauss, MD   72,000 Units at 03/29/23 0840   And   lipase/protease/amylase (CREON) capsule 36,000 Units  36,000 Units Oral TID Langley Gauss, MD   36,000 Units at 03/29/23 0839   loratadine (CLARITIN) tablet 10 mg  10 mg Oral Daily Lorretta Harp, MD   10 mg at 03/29/23 0842   LORazepam (ATIVAN) injection 2 mg  2 mg Intravenous  Q2H PRN Lorretta Harp, MD       multivitamin with minerals tablet 1 tablet  1 tablet Oral Daily Lorretta Harp, MD   1 tablet at 03/29/23 0981   nystatin  (MYCOSTATIN/NYSTOP) topical powder 1 Application  1 Application Topical TID PRN Lorretta Harp, MD       ondansetron [facility]) injection 4 mg  4 mg Intravenous Q8H PRN Lorretta Harp, MD   4 mg at 03/27/23 1914   oxybutynin (DITROPAN-XL) 24 hr tablet 10 mg  10 mg Oral BID Lorretta Harp, MD   10 mg at 03/29/23 7829   oxyCODONE (Oxy IR/ROXICODONE) immediate release tablet 5 mg  5 mg Oral Q6H PRN Lurene Shadow, MD   5 mg at 03/27/23 2205   pantoprazole (PROTONIX) EC tablet 40 mg  40 mg Oral Daily Lorretta Harp, MD   40 mg at 03/29/23 0841   polyethylene glycol (MIRALAX / GLYCOLAX) packet 17 g  17 g Oral Daily PRN Lorretta Harp, MD       tiZANidine (ZANAFLEX) tablet 4 mg  4 mg Oral QHS Lorretta Harp, MD   4 mg at 03/28/23 2142     Abtx:  Anti-infectives (From admission, onward)    Start     Dose/Rate Route Frequency Ordered Stop   03/27/23 1500  cefTRIAXone (ROCEPHIN) 2 g in sodium chloride 0.9 % 100 mL IVPB        2 g 200 mL/hr over 30 Minutes Intravenous Daily 03/27/23 1403     03/24/23 2200  meropenem (MERREM) 1 g in sodium chloride 0.9 % 100 mL IVPB  Status:  Discontinued        1 g 200 mL/hr over 30 Minutes Intravenous Every 8 hours 03/24/23 1852 03/27/23 1403   03/24/23 1315  ceFEPIme (MAXIPIME) 2 g in sodium chloride 0.9 % 100 mL IVPB        2 g 200 mL/hr over 30 Minutes Intravenous  Once 03/24/23 1310 03/24/23 1419   03/24/23 1315  metroNIDAZOLE (FLAGYL) IVPB 500 mg        500 mg 100 mL/hr over 60 Minutes Intravenous  Once 03/24/23 1310 03/24/23 1452   03/24/23 1315  vancomycin (VANCOCIN) IVPB 1000 mg/200 mL premix        1,000 mg 200 mL/hr over 60 Minutes Intravenous  Once 03/24/23 1310 03/24/23 1630       REVIEW OF SYSTEMS:  Const:  fever,  chills, negative weight loss Eyes: negative diplopia or visual changes, negative eye pain ENT: negative coryza, negative sore throat Resp: negative cough, hemoptysis, dyspnea Cards: negative for chest pain, palpitations, lower extremity edema GU: hematuria  thru [facility] GI:  abdominal pain, no diarrhea, bleeding, constipation Skin: negative for rash and pruritus Heme: negative for easy bruising and gum/nose bleeding MS: muscle weakness Neurolo:spastic paraplegia  Psych: anxiety, depression  Endocrine: negative for thyroid, diabetes Allergy/Immunology- as above ? Objective:  VITALS:  BP 122/68 (BP Location: Left Arm)   Pulse 90   Temp 98.3 F (36.8 C)   Resp 15   Ht 5\' 4"  (1.626 m)   Wt 94.3 kg   SpO2 93%   BMI 35.70 kg/m  LDA Foley Central line Other drainage tubes PHYSICAL EXAM:  General: Alert, cooperative, no distress, appears stated age.  Head: Normocephalic, without obvious abnormality, atraumatic. Eyes: Conjunctivae clear, anicteric sclerae. Pupils are equal ENT Nares normal. No drainage or sinus tenderness. Lips, mucosa, and tongue normal. No Thrush Neck: Supple, symmetrical, no adenopathy, thyroid: non tender no carotid bruit and  no JVD. Back: No CVA tenderness. Lungs: Clear to auscultation bilaterally. No Wheezing or Rhonchi. No rales. Heart: Regular rate and rhythm, no murmur, rub or gallop. Abdomen: Soft, SPC cath- tender lower abdomen Extremities: edema legs Skin: No rashes or lesions. Or bruising Lymph: Cervical, supraclavicular normal. Neurologic: did not examine in detail Pertinent Labs Lab Results CBC    Component Value Date/Time   WBC 3.3 (L) 03/29/2023 0550   RBC 3.47 (L) 03/29/2023 0550   HGB 10.1 (L) 03/29/2023 0550   HGB 11.4 (L) 03/22/2023 1011   HGB 12.4 06/27/2016 1515   HCT 30.0 (L) 03/29/2023 0550   HCT 37.9 06/27/2016 1515   PLT 158 03/29/2023 0550   PLT 224 03/22/2023 1011   PLT 163 06/27/2016 1515   MCV 86.5 03/29/2023 0550   MCV 92 06/27/2016 1515   MCV 93 06/05/2014 0951   MCH 29.1 03/29/2023 0550   MCHC 33.7 03/29/2023 0550   RDW 13.6 03/29/2023 0550   RDW 13.2 06/27/2016 1515   RDW 18.7 (H) 06/05/2014 0951   LYMPHSABS 1.4 03/26/2023 0424   LYMPHSABS 1.0 06/27/2016 1515    LYMPHSABS 0.3 (L) 06/05/2014 0951   MONOABS 0.5 03/26/2023 0424   MONOABS 0.3 06/05/2014 0951   EOSABS 0.1 03/26/2023 0424   EOSABS 0.1 06/27/2016 1515   EOSABS 0.1 06/05/2014 0951   BASOSABS 0.0 03/26/2023 0424   BASOSABS 0.0 06/27/2016 1515   BASOSABS 0.0 06/05/2014 0951       Latest Ref Rng & Units 03/26/2023    4:24 AM 03/25/2023    6:00 AM 03/24/2023    1:17 PM  CMP  Glucose 70 - 99 mg/dL 578  469  629   BUN 8 - 23 mg/dL 11  14  14    Creatinine 0.44 - 1.00 mg/dL 5.28  4.13  2.44   Sodium 135 - 145 mmol/L 136  133  134   Potassium 3.5 - 5.1 mmol/L 3.6  3.4  3.5   Chloride 98 - 111 mmol/L 103  105  100   CO2 22 - 32 mmol/L 23  21  23    Calcium 8.9 - 10.3 mg/dL 8.6  8.5  8.9   Total Protein 6.5 - 8.1 g/dL   7.5   Total Bilirubin 0.0 - 1.2 mg/dL   1.0   Alkaline Phos 38 - 126 U/L   95   AST 15 - 41 U/L   30   ALT 0 - 44 U/L   32       Microbiology: Recent Results (from the past 240 hours)  Blood Culture (routine x 2)     Status: None   Collection Time: 03/24/23  1:21 PM   Specimen: BLOOD LEFT ARM  Result Value Ref Range Status   Specimen Description BLOOD LEFT ARM  Final   Special Requests   Final    BOTTLES DRAWN AEROBIC AND ANAEROBIC Blood Culture results may not be optimal due to an inadequate volume of blood received in culture bottles   Culture   Final    NO GROWTH 5 DAYS Performed at [facility], [address] [place], [place] [address]    Report Status 03/29/2023 FINAL  Final  Resp panel by RT-PCR (RSV, Flu A&B, Covid) Anterior Nasal Swab     Status: None   Collection Time: 03/24/23  1:30 PM   Specimen: Anterior Nasal Swab  Result Value Ref Range Status   SARS Coronavirus 2 by RT PCR NEGATIVE NEGATIVE Final  Comment: (NOTE) SARS-CoV-2 target nucleic acids are NOT DETECTED.  The SARS-CoV-2 RNA is generally detectable in upper respiratory specimens during the acute phase of infection. The lowest concentration of SARS-CoV-2 viral copies this  assay can detect is 138 copies/mL. A negative result does not preclude SARS-Cov-2 infection and should not be used as the sole basis for treatment or other patient management decisions. A negative result may occur with  improper specimen collection/handling, submission of specimen other than nasopharyngeal swab, presence of viral mutation(s) within the areas targeted by this assay, and inadequate number of viral copies(<138 copies/mL). A negative result must be combined with clinical observations, patient history, and epidemiological information. The expected result is Negative.  Fact Sheet for Patients:  BloggerCourse.com  Fact Sheet for Healthcare Providers:  SeriousBroker.it  This test is no t yet approved or cleared by the [place] FDA and  has been authorized for detection and/or diagnosis of SARS-CoV-2 by FDA under an Emergency Use Authorization (EUA). This EUA will remain  in effect (meaning this test can be used) for the duration of the COVID-19 declaration under Section 564(b)(1) of the Act, 21 U.S.C.section 360bbb-3(b)(1), unless the authorization is terminated  or revoked sooner.       Influenza A by PCR NEGATIVE NEGATIVE Final   Influenza B by PCR NEGATIVE NEGATIVE Final    Comment: (NOTE) The Xpert Xpress SARS-CoV-2/FLU/RSV plus assay is intended as an aid in the diagnosis of influenza from Nasopharyngeal swab specimens and should not be used as a sole basis for treatment. Nasal washings and aspirates are unacceptable for Xpert Xpress SARS-CoV-2/FLU/RSV testing.  Fact Sheet for Patients: BloggerCourse.com  Fact Sheet for Healthcare Providers: SeriousBroker.it  This test is not yet approved or cleared by the [place] FDA and has been authorized for detection and/or diagnosis of SARS-CoV-2 by FDA under an Emergency Use Authorization (EUA). This EUA will  remain in effect (meaning this test can be used) for the duration of the COVID-19 declaration under Section 564(b)(1) of the Act, 21 U.S.C. section 360bbb-3(b)(1), unless the authorization is terminated or revoked.     Resp Syncytial Virus by PCR NEGATIVE NEGATIVE Final    Comment: (NOTE) Fact Sheet for Patients: BloggerCourse.com  Fact Sheet for Healthcare Providers: SeriousBroker.it  This test is not yet approved or cleared by the [place] FDA and has been authorized for detection and/or diagnosis of SARS-CoV-2 by FDA under an Emergency Use Authorization (EUA). This EUA will remain in effect (meaning this test can be used) for the duration of the COVID-19 declaration under Section 564(b)(1) of the Act, 21 U.S.C. section 360bbb-3(b)(1), unless the authorization is terminated or revoked.  Performed at [facility], [address] [place], [place] [address]   Urine Culture     Status: Abnormal   Collection Time: 03/24/23  1:30 PM   Specimen: Urine, Random  Result Value Ref Range Status   Specimen Description   Final    URINE, RANDOM Performed at [facility], [address] [place], [place] [address]    Special Requests   Final    NONE Reflexed from [id number] Performed at [facility], [address] [place], [place] [address]    Culture >=100,000 COLONIES/mL ESCHERICHIA COLI (A)  Final   Report Status 03/26/2023 FINAL  Final   Organism ID, Bacteria ESCHERICHIA COLI (A)  Final      Susceptibility   Escherichia coli - MIC*    AMPICILLIN >=32 RESISTANT Resistant     CEFAZOLIN <=  4 SENSITIVE Sensitive     CEFEPIME <=0.12 SENSITIVE Sensitive     CEFTRIAXONE <=0.25 SENSITIVE Sensitive     CIPROFLOXACIN >=4 RESISTANT Resistant     GENTAMICIN <=1 SENSITIVE Sensitive     IMIPENEM <=0.25 SENSITIVE Sensitive     NITROFURANTOIN 32 SENSITIVE Sensitive     TRIMETH/SULFA <=20 SENSITIVE Sensitive      AMPICILLIN/SULBACTAM 8 SENSITIVE Sensitive     PIP/TAZO <=4 SENSITIVE Sensitive ug/mL    * >=100,000 COLONIES/mL ESCHERICHIA COLI  Blood Culture (routine x 2)     Status: Abnormal   Collection Time: 03/24/23  1:40 PM   Specimen: BLOOD  Result Value Ref Range Status   Specimen Description   Final    BLOOD BLOOD LEFT ARM Performed at [facility], [address], [place], [place] [address]    Special Requests   Final    BOTTLES DRAWN AEROBIC AND ANAEROBIC Blood Culture adequate volume Performed at [facility], [address] [place], [place] [address]    Culture  Setup Time   Final    GRAM NEGATIVE RODS AEROBIC BOTTLE ONLY CRITICAL RESULT CALLED TO, READ BACK BY AND VERIFIED WITH: CAROLYN COULTER @1837  03/26/23 LFD    Culture ESCHERICHIA COLI (A)  Final   Report Status 03/29/2023 FINAL  Final   Organism ID, Bacteria ESCHERICHIA COLI  Final   Organism ID, Bacteria ESCHERICHIA COLI  Final      Susceptibility   Escherichia coli - KIRBY BAUER*    CEFAZOLIN SENSITIVE Sensitive    Escherichia coli - MIC*    AMPICILLIN >=32 RESISTANT Resistant     CEFEPIME <=0.12 SENSITIVE Sensitive     CEFTAZIDIME <=1 SENSITIVE Sensitive     CEFTRIAXONE <=0.25 SENSITIVE Sensitive     CIPROFLOXACIN >=4 RESISTANT Resistant     GENTAMICIN <=1 SENSITIVE Sensitive     IMIPENEM <=0.25 SENSITIVE Sensitive     TRIMETH/SULFA <=20 SENSITIVE Sensitive     AMPICILLIN/SULBACTAM 8 SENSITIVE Sensitive     PIP/TAZO <=4 SENSITIVE Sensitive ug/mL    * ESCHERICHIA COLI    ESCHERICHIA COLI  Blood Culture ID Panel (Reflexed)     Status: Abnormal   Collection Time: 03/24/23  1:40 PM  Result Value Ref Range Status   Enterococcus faecalis NOT DETECTED NOT DETECTED Final   Enterococcus Faecium NOT DETECTED NOT DETECTED Final   Listeria monocytogenes NOT DETECTED NOT DETECTED Final   Staphylococcus species NOT DETECTED NOT DETECTED Final   Staphylococcus aureus (BCID) NOT DETECTED NOT DETECTED  Final   Staphylococcus epidermidis NOT DETECTED NOT DETECTED Final   Staphylococcus lugdunensis NOT DETECTED NOT DETECTED Final   Streptococcus species NOT DETECTED NOT DETECTED Final   Streptococcus agalactiae NOT DETECTED NOT DETECTED Final   Streptococcus pneumoniae NOT DETECTED NOT DETECTED Final   Streptococcus pyogenes NOT DETECTED NOT DETECTED Final   A.calcoaceticus-baumannii NOT DETECTED NOT DETECTED Final   Bacteroides fragilis NOT DETECTED NOT DETECTED Final   Enterobacterales DETECTED (A) NOT DETECTED Final    Comment: Enterobacterales represent a large order of gram negative bacteria, not a single organism. CRITICAL RESULT CALLED TO, READ BACK BY AND VERIFIED WITH: CAROLYN COULTER @ 1837 03/26/23 LFD    Enterobacter cloacae complex NOT DETECTED NOT DETECTED Final   Escherichia coli DETECTED (A) NOT DETECTED Final    Comment: CRITICAL RESULT CALLED TO, READ BACK BY AND VERIFIED WITH: CAROLYN COULTER @ 1837 03/26/23 LFD    Klebsiella aerogenes NOT DETECTED NOT DETECTED Final   Klebsiella oxytoca NOT DETECTED  NOT DETECTED Final   Klebsiella pneumoniae NOT DETECTED NOT DETECTED Final   Proteus species NOT DETECTED NOT DETECTED Final   Salmonella species NOT DETECTED NOT DETECTED Final   Serratia marcescens NOT DETECTED NOT DETECTED Final   Haemophilus influenzae NOT DETECTED NOT DETECTED Final   Neisseria meningitidis NOT DETECTED NOT DETECTED Final   Pseudomonas aeruginosa NOT DETECTED NOT DETECTED Final   Stenotrophomonas maltophilia NOT DETECTED NOT DETECTED Final   Candida albicans NOT DETECTED NOT DETECTED Final   Candida auris NOT DETECTED NOT DETECTED Final   Candida glabrata NOT DETECTED NOT DETECTED Final   Candida krusei NOT DETECTED NOT DETECTED Final   Candida parapsilosis NOT DETECTED NOT DETECTED Final   Candida tropicalis NOT DETECTED NOT DETECTED Final   Cryptococcus neoformans/gattii NOT DETECTED NOT DETECTED Final   CTX-M ESBL NOT DETECTED NOT DETECTED  Final   Carbapenem resistance IMP NOT DETECTED NOT DETECTED Final   Carbapenem resistance KPC NOT DETECTED NOT DETECTED Final   Carbapenem resistance NDM NOT DETECTED NOT DETECTED Final   Carbapenem resist OXA 48 LIKE NOT DETECTED NOT DETECTED Final   Carbapenem resistance VIM NOT DETECTED NOT DETECTED Final    Comment: Performed at [facility], [address] [place], [place] [address]    IMAGING RESULTS: CXR N  I have personally reviewed the films  B/l pyelonephritis and cystitis Uterine fiborids ? Impression/Recommendation Ecoli bacteremia with complicated UTI with persistent hematuria  After placement of SPC on 2/13 Procedure complicated by foley ballon bursting and coming out and being replaced Trauma to the bladder need to r/o Vesicovaginal fistula as patient c/o blood from vagina Continue ceftriaxone Repeat blood culture   Anemia Oral thrush- nystatin swish and swallow  Multiple sclerosis Spastic paraplegia  Lymph edema legs  Neurogenic bladder had foley sicne dec 2019, and it was changed to [facility] on 03/23/23   ? ? I have personally spent  --75-minutes involved in face-to-face and non-face-to-face activities for this patient on the day of the visit. Professional time spent includes the following activities: Preparing to see the patient (review of tests), Obtaining and/or reviewing separately obtained history (admission/discharge record), Performing a medically appropriate examination and/or evaluation , Ordering medications/tests/procedures, referring and communicating with other health care professionals, Documenting clinical information in the EMR, Independently interpreting results (not separately reported), Communicating results to the patient/family/caregiver, Counseling and educating the patient/family/caregiver and Care coordination (not separately reported).    ________________________________________________ Discussed with patient, requesting provider  and nurse Note:  This document was prepared using Dragon voice recognition software and may include unintentional dictation errors.

## 2023-03-29 NOTE — Progress Notes (Signed)
[facility]  - [place] at [facility]   PATIENT NAME: Rebecca [name]    MR#:  [id number]  DATE OF BIRTH:  04-12-1960  SUBJECTIVE:  patient continues to have dark urine in her Foley bag and vaginally bleeding with clots no fever. No family at bedside.   VITALS:  Blood pressure 122/68, pulse 90, temperature 98.3 F (36.8 C), resp. rate 15, height 5\' 4"  (1.626 m), weight 94.3 kg, SpO2 93%.  PHYSICAL EXAMINATION:   GENERAL:  63 y.o.-year-old patient with no acute distress. Morbidly obese bedbound LUNGS: decreased breath sounds bilaterally, no wheezing CARDIOVASCULAR: S1, S2 normal. No murmur   ABDOMEN: Soft, nontender, nondistended. Suprapubic catheter present EXTREMITIES: puffy bilateral lower extremity with chronic venous stasis changes NEUROLOGIC: nonfocal  patient is alert and awake SKIN per RN  LABORATORY PANEL:  CBC Recent Labs  Lab 03/29/23 0550  WBC 3.3*  HGB 10.1*  HCT 30.0*  PLT 158    Chemistries  Recent Labs  Lab 03/24/23 1317 03/25/23 0600 03/26/23 0424  NA 134*   < > 136  K 3.5   < > 3.6  CL 100   < > 103  CO2 23   < > 23  GLUCOSE 151*   < > 102*  BUN 14   < > 11  CREATININE 0.57   < > 0.54  CALCIUM 8.9   < > 8.6*  AST 30  --   --   ALT 32  --   --   ALKPHOS 95  --   --   BILITOT 1.0  --   --    < > = values in this interval not displayed.   Cardiac Enzymes No results for input(s): "TROPONINI" in the last 168 hours. RADIOLOGY:  US PELVIS LIMITED (TRANSABDOMINAL ONLY) Result Date: 03/28/2023 CLINICAL DATA:  Fever and hematuria. Evaluate urinary bladder. Suprapubic catheter in place. EXAM: LIMITED ULTRASOUND OF PELVIS TECHNIQUE: Limited transabdominal ultrasound examination of the pelvis was performed. COMPARISON:  Pelvic ultrasound dated 11/20/2018. CT abdomen pelvis dated 03/24/2023. FINDINGS: The urinary bladder is not visualized. A 2.7 x 2.4 x 2.2 cm hypoechoic lesion the pelvis, likely a fibroid. IMPRESSION: Nonvisualization of  the urinary bladder. Electronically Signed   By: Elgie Collard M.D.   On: 03/28/2023 19:10    Assessment and Plan  Rebecca [name] is a 63 y.o. female  with medical history significant of  MS, spasmatic paraplegia, bed-bound  neurogenic bladder, diastolic CHF, cardiac, chronic pancreatic insufficiency, lymphedema, breast cancer (s/p of right mastectomy), endocarditis, seizure, s/p of suprapubic catheter placement on 2/13.  She presented to the hospital with abdominal pain, fever, chills and bloody urine.   Sepsis due to complicated E. coli UTI/hematuria/? Traumatic Status post suprapubic catheter placement March 23, 2023 Neurogenic bladder -- patient continues to have bloody urine -- ID consultation -- currently on IV Rocephin according to blood culture sensitivities -- urology consultation appreciated-- consider CT cystogram to rule out traumatic fistula -- hemoglobin stable at 10  Hypokalemia Hyponatremia -- improve  Seizure disorder -- continue Keppra  Multiple sclerosis/neurogenic bladder was fully dependent however now has suprapubic catheter -- continue home meds  Pancreatic insufficiency -- on creon    Procedures: Family communication :none Consults : ID, urology CODE STATUS: full DVT Prophylaxis : SCD Level of care: Med-Surg Status is: Inpatient Remains inpatient appropriate because: complicated UTI    TOTAL TIME TAKING CARE OF THIS PATIENT: 35 minutes.  >50% time spent on counselling and coordination of care  Note: This dictation was prepared with Dragon dictation along with smaller phrase technology. Any transcriptional errors that result from this process are unintentional.  Enedina Finner M.D    [facility]   CC: Primary care physician; Danelle Berry, PA-C

## 2023-03-29 NOTE — Plan of Care (Signed)
Pt is alert and oriented, denies c/o pain. Pt has suprapubic with leg bag draining red bloody drainage, Dr. Allena Katz aware. Pt is bed bound due to MS. Pt has foam dressing to sacrum for pressure injury.   Problem: Education: Goal: Knowledge of General Education information will improve Description: Including pain rating scale, medication(s)/side effects and non-pharmacologic comfort measures Outcome: Not Progressing   Problem: Health Behavior/Discharge Planning: Goal: Ability to manage health-related needs will improve Outcome: Not Progressing   Problem: Clinical Measurements: Goal: Ability to maintain clinical measurements within normal limits will improve Outcome: Not Progressing Goal: Will remain free from infection Outcome: Not Progressing Goal: Diagnostic test results will improve Outcome: Not Progressing Goal: Respiratory complications will improve Outcome: Not Progressing Goal: Cardiovascular complication will be avoided Outcome: Not Progressing   Problem: Activity: Goal: Risk for activity intolerance will decrease Outcome: Not Progressing   Problem: Nutrition: Goal: Adequate nutrition will be maintained Outcome: Not Progressing   Problem: Coping: Goal: Level of anxiety will decrease Outcome: Not Progressing   Problem: Elimination: Goal: Will not experience complications related to bowel motility Outcome: Not Progressing Goal: Will not experience complications related to urinary retention Outcome: Not Progressing   Problem: Pain Managment: Goal: General experience of comfort will improve and/or be controlled Outcome: Not Progressing   Problem: Safety: Goal: Ability to remain free from injury will improve Outcome: Not Progressing   Problem: Skin Integrity: Goal: Risk for impaired skin integrity will decrease Outcome: Not Progressing

## 2023-03-30 DIAGNOSIS — E669 Obesity, unspecified: Secondary | ICD-10-CM | POA: Diagnosis not present

## 2023-03-30 DIAGNOSIS — G35 Multiple sclerosis: Secondary | ICD-10-CM | POA: Diagnosis not present

## 2023-03-30 DIAGNOSIS — R7881 Bacteremia: Secondary | ICD-10-CM | POA: Diagnosis not present

## 2023-03-30 DIAGNOSIS — N319 Neuromuscular dysfunction of bladder, unspecified: Secondary | ICD-10-CM | POA: Diagnosis not present

## 2023-03-30 DIAGNOSIS — B9620 Unspecified Escherichia coli [E. coli] as the cause of diseases classified elsewhere: Secondary | ICD-10-CM

## 2023-03-30 DIAGNOSIS — S3720XA Unspecified injury of bladder, initial encounter: Secondary | ICD-10-CM

## 2023-03-30 DIAGNOSIS — N029 Recurrent and persistent hematuria with unspecified morphologic changes: Secondary | ICD-10-CM | POA: Diagnosis not present

## 2023-03-30 DIAGNOSIS — N390 Urinary tract infection, site not specified: Secondary | ICD-10-CM | POA: Diagnosis not present

## 2023-03-30 LAB — HEMOGLOBIN: Hemoglobin: 9.7 g/dL — ABNORMAL LOW (ref 12.0–15.0)

## 2023-03-30 NOTE — Progress Notes (Signed)
Urology Consult Follow Up  Subjective: Patient's hematuria has worsened.  She continues to have urinary output from the SPT and from the urethra.   She stated she did have issues with urinary leakage with the indwelling Foley as well.  She is oxybutynin XL 10 mg BID.    VSS afebrile  Morning hemoglobin 9.7, slightly down from 10.1   Anti-infectives: Anti-infectives (From admission, onward)    Start     Dose/Rate Route Frequency Ordered Stop   03/27/23 1500  cefTRIAXone (ROCEPHIN) 2 g in sodium chloride 0.9 % 100 mL IVPB        2 g 200 mL/hr over 30 Minutes Intravenous Daily 03/27/23 1403     03/24/23 2200  meropenem (MERREM) 1 g in sodium chloride 0.9 % 100 mL IVPB  Status:  Discontinued        1 g 200 mL/hr over 30 Minutes Intravenous Every 8 hours 03/24/23 1852 03/27/23 1403   03/24/23 1315  ceFEPIme (MAXIPIME) 2 g in sodium chloride 0.9 % 100 mL IVPB        2 g 200 mL/hr over 30 Minutes Intravenous  Once 03/24/23 1310 03/24/23 1419   03/24/23 1315  metroNIDAZOLE (FLAGYL) IVPB 500 mg        500 mg 100 mL/hr over 60 Minutes Intravenous  Once 03/24/23 1310 03/24/23 1452   03/24/23 1315  vancomycin (VANCOCIN) IVPB 1000 mg/200 mL premix        1,000 mg 200 mL/hr over 60 Minutes Intravenous  Once 03/24/23 1310 03/24/23 1630       Current Facility-Administered Medications  Medication Dose Route Frequency Provider Last Rate Last Admin   acetaminophen (TYLENOL) tablet 650 mg  650 mg Oral Q6H PRN Lorretta Harp, MD   650 mg at 03/26/23 1433   amantadine (SYMMETREL) capsule 100 mg  100 mg Oral TID Lorretta Harp, MD   100 mg at 03/30/23 8469   baclofen (LIORESAL) tablet 10-20 mg  10-20 mg Oral TID Lorretta Harp, MD   20 mg at 03/30/23 6295   cefTRIAXone (ROCEPHIN) 2 g in sodium chloride 0.9 % 100 mL IVPB  2 g Intravenous Q1400 Lurene Shadow, MD 200 mL/hr at 03/29/23 1405 2 g at 03/29/23 1405   guaiFENesin-dextromethorphan (ROBITUSSIN DM) 100-10 MG/5ML syrup 5 mL  5 mL Oral Q4H PRN Lurene Shadow,  MD   5 mL at 03/28/23 1847   levETIRAcetam (KEPPRA) tablet 500 mg  500 mg Oral BID Lorretta Harp, MD   500 mg at 03/30/23 2841   lipase/protease/amylase (CREON) capsule 72,000 Units  72,000 Units Oral TID Langley Gauss, MD   72,000 Units at 03/30/23 3244   And   lipase/protease/amylase (CREON) capsule 36,000 Units  36,000 Units Oral TID Langley Gauss, MD   36,000 Units at 03/29/23 1615   loratadine (CLARITIN) tablet 10 mg  10 mg Oral Daily Lorretta Harp, MD   10 mg at 03/30/23 0102   LORazepam (ATIVAN) injection 2 mg  2 mg Intravenous Q2H PRN Lorretta Harp, MD       multivitamin with minerals tablet 1 tablet  1 tablet Oral Daily Lorretta Harp, MD   1 tablet at 03/30/23 7253   nystatin (MYCOSTATIN/NYSTOP) topical powder 1 Application  1 Application Topical TID PRN Lorretta Harp, MD       ondansetron [facility]) injection 4 mg  4 mg Intravenous Q8H PRN Lorretta Harp, MD   4 mg at 03/27/23 0914   oxybutynin (DITROPAN-XL) 24 hr tablet 10 mg  10  mg Oral BID Lorretta Harp, MD   10 mg at 03/30/23 1610   oxyCODONE (Oxy IR/ROXICODONE) immediate release tablet 5 mg  5 mg Oral Q6H PRN Lurene Shadow, MD   5 mg at 03/27/23 2205   pantoprazole (PROTONIX) EC tablet 40 mg  40 mg Oral Daily Lorretta Harp, MD   40 mg at 03/30/23 9604   polyethylene glycol (MIRALAX / GLYCOLAX) packet 17 g  17 g Oral Daily PRN Lorretta Harp, MD       tiZANidine (ZANAFLEX) tablet 4 mg  4 mg Oral QHS Lorretta Harp, MD   4 mg at 03/29/23 2150     Objective: Vital signs in last 24 hours: Temp:  [97.8 F (36.6 C)-98.6 F (37 C)] 97.9 F (36.6 C) (02/20 0756) Pulse Rate:  [91-95] 91 (02/20 0756) Resp:  [16] 16 (02/20 0756) BP: (128-136)/(64-81) 133/64 (02/20 0756) SpO2:  [92 %-98 %] 98 % (02/20 0756)  Intake/Output from previous day: 02/19 0701 - 02/20 0700 In: 120 [P.O.:120] Out: 1150 [Urine:1150] Intake/Output this shift: No intake/output data recorded.   Physical Exam Vitals and nursing note reviewed.  Constitutional:      Appearance: She is  obese. She is ill-appearing.  HENT:     Head: Normocephalic and atraumatic.  Eyes:     Extraocular Movements: Extraocular movements intact.     Pupils: Pupils are equal, round, and reactive to light.  Pulmonary:     Effort: Pulmonary effort is normal.  Abdominal:     General: There is no distension.     Palpations: Abdomen is soft.     Tenderness: There is no abdominal tenderness. There is no guarding.  Genitourinary:    Comments: SPT in place.  Dressings are dry and clean.  Draining dark red urine.   Neurological:     Mental Status: She is alert.    Lab Results:  Recent Labs    03/29/23 0550  WBC 3.3*  HGB 10.1*  HCT 30.0*  PLT 158   BMET No results for input(s): "NA", "K", "CL", "CO2", "GLUCOSE", "BUN", "CREATININE", "CALCIUM" in the last 72 hours. PT/INR No results for input(s): "LABPROT", "INR" in the last 72 hours. ABG No results for input(s): "PHART", "HCO3" in the last 72 hours.  Invalid input(s): "PCO2", "PO2"  Studies/Results: US PELVIS LIMITED (TRANSABDOMINAL ONLY) Result Date: 03/28/2023 CLINICAL DATA:  Fever and hematuria. Evaluate urinary bladder. Suprapubic catheter in place. EXAM: LIMITED ULTRASOUND OF PELVIS TECHNIQUE: Limited transabdominal ultrasound examination of the pelvis was performed. COMPARISON:  Pelvic ultrasound dated 11/20/2018. CT abdomen pelvis dated 03/24/2023. FINDINGS: The urinary bladder is not visualized. A 2.7 x 2.4 x 2.2 cm hypoechoic lesion the pelvis, likely a fibroid. IMPRESSION: Nonvisualization of the urinary bladder. Electronically Signed   By: Elgie Collard M.D.   On: 03/28/2023 19:10     Assessment and Plan: 63 year old female with neurogenic bladder secondary to MS who was admitted for sepsis secondary to pyelonephritis associated with gross hematuria.   Her neurogenic bladder was managed with an indwelling Foley, but was recently converted to an SPT on March 23, 2023 and she developed fevers and gross hematuria the next  day and was admitted.     -An 18 Fr Foley was placed via urethra and she was irrigated with 600 cc of sterile saline until clear with the clearance of 10 cc of clot material.  Foley was left in place and plugged in case further irrigation is needed.  If she becomes uncomfortable, has bladder distention or  decrease in UOP via SPT, the Foley can be unplugged and placed on gravity drainage  -there are no known risk factors for vesicovaginal fistula, so low suspicion at this time   -Antibiotics per primary team  -urology to follow    LOS: 6 days    [facility] 03/30/2023

## 2023-03-30 NOTE — Progress Notes (Signed)
[facility]  - [place] at [facility]   PATIENT NAME: Rebecca [name]    MR#:  [id number]  DATE OF BIRTH:  [birth year]  SUBJECTIVE:  patient continues to have dark urine in her Foley bag no fever. Tolerating PO diet.  VITALS:  Blood pressure 133/64, pulse 91, temperature 97.9 F (36.6 C), resp. rate 16, height 5\' 4"  (1.626 m), weight 94.3 kg, SpO2 98%.  PHYSICAL EXAMINATION:   GENERAL:  63 y.o.-year-old patient with no acute distress. Morbidly obese bedbound LUNGS: decreased breath sounds bilaterally, no wheezing CARDIOVASCULAR: S1, S2 normal. No murmur   ABDOMEN: Soft, nontender, nondistended. Suprapubic catheter present EXTREMITIES: puffy bilateral lower extremity with chronic venous stasis changes NEUROLOGIC: nonfocal  patient is alert and awake SKIN per RN  LABORATORY PANEL:  CBC Recent Labs  Lab 03/29/23 0550 03/30/23 0938  WBC 3.3*  --   HGB 10.1* 9.7*  HCT 30.0*  --   PLT 158  --     Chemistries  Recent Labs  Lab 03/24/23 1317 03/25/23 0600 03/26/23 0424  NA 134*   < > 136  K 3.5   < > 3.6  CL 100   < > 103  CO2 23   < > 23  GLUCOSE 151*   < > 102*  BUN 14   < > 11  CREATININE 0.57   < > 0.54  CALCIUM 8.9   < > 8.6*  AST 30  --   --   ALT 32  --   --   ALKPHOS 95  --   --   BILITOT 1.0  --   --    < > = values in this interval not displayed.   Cardiac Enzymes No results for input(s): "TROPONINI" in the last 168 hours. RADIOLOGY:  US PELVIS LIMITED (TRANSABDOMINAL ONLY) Result Date: 03/28/2023 CLINICAL DATA:  Fever and hematuria. Evaluate urinary bladder. Suprapubic catheter in place. EXAM: LIMITED ULTRASOUND OF PELVIS TECHNIQUE: Limited transabdominal ultrasound examination of the pelvis was performed. COMPARISON:  Pelvic ultrasound dated 11/20/2018. CT abdomen pelvis dated 03/24/2023. FINDINGS: The urinary bladder is not visualized. A 2.7 x 2.4 x 2.2 cm hypoechoic lesion the pelvis, likely a fibroid. IMPRESSION: Nonvisualization of the  urinary bladder. Electronically Signed   By: Elgie Collard M.D.   On: 03/28/2023 19:10    Assessment and Plan  Rebecca [name] is a 63 y.o. female  with medical history significant of  MS, spasmatic paraplegia, bed-bound  neurogenic bladder, diastolic CHF, cardiac, chronic pancreatic insufficiency, lymphedema, breast cancer (s/p of right mastectomy), endocarditis, seizure, s/p of suprapubic catheter placement on 2/13.  She presented to the hospital with abdominal pain, fever, chills and bloody urine.   Sepsis due to complicated E. coli UTI/hematuria/? Traumatic Status post suprapubic catheter placement March 23, 2023 Neurogenic bladder -- patient continues to have bloody urine -- ID consultation -- currently on IV Rocephin according to blood culture sensitivities -- urology consultation appreciated-- consider CT cystogram to rule out traumatic fistula -- hemoglobin down to 9.7 -- spoke with urology PA Carollee Herter and she plans to irrigate the bladder to see if there is any remnant blood clot post procedure  Hypokalemia Hyponatremia -- improve  Seizure disorder -- continue Keppra  Multiple sclerosis/neurogenic bladder was fully dependent however now has suprapubic catheter -- continue home meds  Pancreatic insufficiency -- on creon    Procedures: Family communication :none Consults : ID, urology CODE STATUS: full DVT Prophylaxis : SCD Level of care: Med-Surg Status is:  Inpatient Remains inpatient appropriate because: complicated UTI    TOTAL TIME TAKING CARE OF THIS PATIENT: 35 minutes.  >50% time spent on counselling and coordination of care  Note: This dictation was prepared with Dragon dictation along with smaller phrase technology. Any transcriptional errors that result from this process are unintentional.  Enedina Finner M.D    [facility]   CC: Primary care physician; Danelle Berry, PA-C

## 2023-03-30 NOTE — Progress Notes (Signed)
Date of Admission:  03/24/2023    ID: Rebecca [name] is a 63 y.o. female  Principal Problem:   Complicated UTI (urinary tract infection) Active Problems:   MS (multiple sclerosis) (HCC)   Seizure disorder (HCC)   Neurogenic bladder   Pancreatic insufficiency   Acute pyelonephritis   Sepsis (HCC)   Chronic diastolic CHF (congestive heart failure) (HCC)   Obesity (BMI 30-39.9)    Subjective: Pt still has bloody urine in the spc bag Has foley cath as well  Medications:   amantadine  100 mg Oral TID   baclofen  10-20 mg Oral TID   levETIRAcetam  500 mg Oral BID   lipase/protease/amylase  72,000 Units Oral TID AC   And   lipase/protease/amylase  36,000 Units Oral TID AC   loratadine  10 mg Oral Daily   multivitamin with minerals  1 tablet Oral Daily   oxybutynin  10 mg Oral BID   pantoprazole  40 mg Oral Daily   tiZANidine  4 mg Oral QHS    Objective: Vital signs in last 24 hours: Patient Vitals for the past 24 hrs:  BP Temp Pulse Resp SpO2  03/30/23 0756 133/64 97.9 F (36.6 C) 91 16 98 %  03/30/23 0410 136/70 97.8 F (36.6 C) 92 -- 94 %  03/29/23 2036 128/81 98.6 F (37 C) 95 -- 92 %     LDA Foley SPC  PHYSICAL EXAM:  General: Alert, cooperative, no distress, appears stated age.  Lungs: Clear to auscultation bilaterally. No Wheezing or Rhonchi. No rales. Heart: Regular rate and rhythm, no murmur, rub or gallop. Abdomen: Soft, non-tender,not distended.suprapubic cathteter Foley catheter  Extremities: edema legs Skin: No rashes or lesions. Or bruising Lymph: Cervical, supraclavicular normal. Neurologic: spastic paraplegia  Lab Results    Latest Ref Rng & Units 03/30/2023    9:38 AM 03/29/2023    5:50 AM 03/26/2023    4:24 AM  CBC  WBC 4.0 - 10.5 K/uL  3.3  7.6   Hemoglobin 12.0 - 15.0 g/dL 9.7  16.1  09.6   Hematocrit 36.0 - 46.0 %  30.0  32.0   Platelets 150 - 400 K/uL  158  124        Latest Ref Rng & Units 03/26/2023    4:24 AM 03/25/2023     6:00 AM 03/24/2023    1:17 PM  CMP  Glucose 70 - 99 mg/dL 045  409  811   BUN 8 - 23 mg/dL 11  14  14    Creatinine 0.44 - 1.00 mg/dL 9.14  7.82  9.56   Sodium 135 - 145 mmol/L 136  133  134   Potassium 3.5 - 5.1 mmol/L 3.6  3.4  3.5   Chloride 98 - 111 mmol/L 103  105  100   CO2 22 - 32 mmol/L 23  21  23    Calcium 8.9 - 10.3 mg/dL 8.6  8.5  8.9   Total Protein 6.5 - 8.1 g/dL   7.5   Total Bilirubin 0.0 - 1.2 mg/dL   1.0   Alkaline Phos 38 - 126 U/L   95   AST 15 - 41 U/L   30   ALT 0 - 44 U/L   32       Microbiology: 03/24/23 BC- Ivan Croft 2/19 /25 BC sent    Assessment/Plan: Ecoli bacteremia with complicated UTI with b/l pyelonephritis and perivesicular inflammation  with persistent hematuria After placement of SPC on 2/13 Procedure complicated  by foley ballon bursting and coming out and being replaced Trauma to the bladder , need to r/o Vesicovaginal fistula as patient c/o blood from vagina Repeat blood culture  sent  Continue ceftriaxone ( day 6 of appropriate antibiotic) Will need minimum 2 weeks of antibiotic ( as resistant to cipro and allergic to sulfa Other Po cephalosporins dont have good bioavilablity and because of severity of infection and severe trauma will do IV as much as possible)  Anemia  Oral thrush- nystatin swish and swallow   Multiple sclerosis Spastic paraplegia   Lymph edema legs   Neurogenic bladder had foley since dec 2019, and it was changed to [facility] on 03/23/23  Discussed the management with the patient and care team

## 2023-03-30 NOTE — Plan of Care (Signed)
   Problem: Education: Goal: Knowledge of General Education information will improve Description: Including pain rating scale, medication(s)/side effects and non-pharmacologic comfort measures Outcome: Progressing   Problem: Health Behavior/Discharge Planning: Goal: Ability to manage health-related needs will improve Outcome: Progressing   Problem: Clinical Measurements: Goal: Ability to maintain clinical measurements within normal limits will improve Outcome: Progressing Goal: Will remain free from infection Outcome: Progressing Goal: Diagnostic test results will improve Outcome: Progressing Goal: Respiratory complications will improve Outcome: Progressing Goal: Cardiovascular complication will be avoided Outcome: Progressing   Problem: Activity: Goal: Risk for activity intolerance will decrease Outcome: Progressing   Problem: Nutrition: Goal: Adequate nutrition will be maintained Outcome: Progressing   Problem: Coping: Goal: Level of anxiety will decrease Outcome: Progressing   Problem: Pain Managment: Goal: General experience of comfort will improve and/or be controlled Outcome: Progressing   Problem: Safety: Goal: Ability to remain free from injury will improve Outcome: Progressing   Problem: Skin Integrity: Goal: Risk for impaired skin integrity will decrease Outcome: Progressing

## 2023-03-31 DIAGNOSIS — G35 Multiple sclerosis: Secondary | ICD-10-CM | POA: Diagnosis not present

## 2023-03-31 DIAGNOSIS — N029 Recurrent and persistent hematuria with unspecified morphologic changes: Secondary | ICD-10-CM | POA: Diagnosis not present

## 2023-03-31 DIAGNOSIS — E669 Obesity, unspecified: Secondary | ICD-10-CM | POA: Diagnosis not present

## 2023-03-31 DIAGNOSIS — N390 Urinary tract infection, site not specified: Secondary | ICD-10-CM | POA: Diagnosis not present

## 2023-03-31 DIAGNOSIS — B9620 Unspecified Escherichia coli [E. coli] as the cause of diseases classified elsewhere: Secondary | ICD-10-CM | POA: Diagnosis not present

## 2023-03-31 DIAGNOSIS — N319 Neuromuscular dysfunction of bladder, unspecified: Secondary | ICD-10-CM | POA: Diagnosis not present

## 2023-03-31 DIAGNOSIS — R7881 Bacteremia: Secondary | ICD-10-CM | POA: Diagnosis not present

## 2023-03-31 LAB — CBC WITH DIFFERENTIAL/PLATELET
Abs Immature Granulocytes: 0.02 10*3/uL (ref 0.00–0.07)
Basophils Absolute: 0 10*3/uL (ref 0.0–0.1)
Basophils Relative: 0 %
Eosinophils Absolute: 0.1 10*3/uL (ref 0.0–0.5)
Eosinophils Relative: 4 %
HCT: 27.6 % — ABNORMAL LOW (ref 36.0–46.0)
Hemoglobin: 9.3 g/dL — ABNORMAL LOW (ref 12.0–15.0)
Immature Granulocytes: 1 %
Lymphocytes Relative: 43 %
Lymphs Abs: 1.2 10*3/uL (ref 0.7–4.0)
MCH: 29.3 pg (ref 26.0–34.0)
MCHC: 33.7 g/dL (ref 30.0–36.0)
MCV: 87.1 fL (ref 80.0–100.0)
Monocytes Absolute: 0.3 10*3/uL (ref 0.1–1.0)
Monocytes Relative: 9 %
Neutro Abs: 1.2 10*3/uL — ABNORMAL LOW (ref 1.7–7.7)
Neutrophils Relative %: 43 %
Platelets: 184 10*3/uL (ref 150–400)
RBC: 3.17 MIL/uL — ABNORMAL LOW (ref 3.87–5.11)
RDW: 13.7 % (ref 11.5–15.5)
Smear Review: NORMAL
WBC: 2.8 10*3/uL — ABNORMAL LOW (ref 4.0–10.5)
nRBC: 0 % (ref 0.0–0.2)

## 2023-03-31 MED ORDER — CEFADROXIL 500 MG PO CAPS: 1000.0000 mg | ORAL_CAPSULE | Freq: Two times a day (BID) | ORAL | 0 refills | Status: AC

## 2023-03-31 NOTE — Progress Notes (Signed)
Date of Admission:  03/24/2023    ID: Rebecca [name] is a 63 y.o. female  Principal Problem:   Complicated UTI (urinary tract infection) Active Problems:   MS (multiple sclerosis) (HCC)   Seizure disorder (HCC)   Neurogenic bladder   Pancreatic insufficiency   Acute pyelonephritis   Sepsis (HCC)   Chronic diastolic CHF (congestive heart failure) (HCC)   Obesity (BMI 30-39.9)   Bacteremia due to Escherichia coli   Traumatic injury of bladder    Subjective: Pt feeling better No bloody urine in [facility] Foley removed  Medications:   amantadine  100 mg Oral TID   baclofen  10-20 mg Oral TID   levETIRAcetam  500 mg Oral BID   lipase/protease/amylase  72,000 Units Oral TID AC   And   lipase/protease/amylase  36,000 Units Oral TID AC   loratadine  10 mg Oral Daily   multivitamin with minerals  1 tablet Oral Daily   oxybutynin  10 mg Oral BID   pantoprazole  40 mg Oral Daily   tiZANidine  4 mg Oral QHS    Objective: Vital signs in last 24 hours: Patient Vitals for the past 24 hrs:  BP Temp Pulse Resp SpO2  03/31/23 0742 137/74 98.2 F (36.8 C) 89 18 95 %  03/31/23 0442 (!) 163/74 98.2 F (36.8 C) 90 18 94 %  03/30/23 1939 131/67 (!) 97.5 F (36.4 C) 95 18 97 %     LDA Foley SPC  PHYSICAL EXAM:  General: Alert, cooperative, no distress, appears stated age.  Lungs: Clear to auscultation bilaterally. No Wheezing or Rhonchi. No rales. Heart: Regular rate and rhythm, no murmur, rub or gallop. Abdomen: Soft, non-tender,not distended.suprapubic cathteter Foley catheter  Extremities: edema legs Skin: No rashes or lesions. Or bruising Lymph: Cervical, supraclavicular normal. Neurologic: spastic paraplegia  Lab Results    Latest Ref Rng & Units 03/31/2023    5:46 AM 03/30/2023    9:38 AM 03/29/2023    5:50 AM  CBC  WBC 4.0 - 10.5 K/uL 2.8   3.3   Hemoglobin 12.0 - 15.0 g/dL 9.3  9.7  40.9   Hematocrit 36.0 - 46.0 % 27.6   30.0   Platelets 150 - 400 K/uL 184   158         Latest Ref Rng & Units 03/26/2023    4:24 AM 03/25/2023    6:00 AM 03/24/2023    1:17 PM  CMP  Glucose 70 - 99 mg/dL 811  914  782   BUN 8 - 23 mg/dL 11  14  14    Creatinine 0.44 - 1.00 mg/dL 9.56  2.13  0.86   Sodium 135 - 145 mmol/L 136  133  134   Potassium 3.5 - 5.1 mmol/L 3.6  3.4  3.5   Chloride 98 - 111 mmol/L 103  105  100   CO2 22 - 32 mmol/L 23  21  23    Calcium 8.9 - 10.3 mg/dL 8.6  8.5  8.9   Total Protein 6.5 - 8.1 g/dL   7.5   Total Bilirubin 0.0 - 1.2 mg/dL   1.0   Alkaline Phos 38 - 126 U/L   95   AST 15 - 41 U/L   30   ALT 0 - 44 U/L   32       Microbiology: 03/24/23 BC- Ivan Croft 2/19 /25 BC NG    Assessment/Plan: Ecoli bacteremia with complicated UTI with b/l pyelonephritis and perivesicular inflammation  with persistent hematuria After placement of SPC on 2/13 Procedure complicated by foley ballon bursting and coming out and being replaced Trauma to the bladder , need to r/o Vesicovaginal fistula as patient c/o blood from vagina Repeat blood culture  NG  Will need minimum 2 weeks of antibiotic ( as resistant to cipro and allergic to sulfa Other Po cephalosporins dont have good bioavilablity and because of severity of infection and severe trauma will do IV as much as possible)  Continue ceftriaxone  for atleast 9 days 04/02/23 and then  discharge on PO cefadroxil 1 gram BID for 6 days ( 04/08/23)    Anemia Leucopenia Recommend checking B12 /folate   Oral thrush- nystatin swish and swallow   Multiple sclerosis Spastic paraplegia   Lymphedema legs   Neurogenic bladder had foley since dec 2019, and it was changed to [facility] on 03/23/23  Discussed the management with the patient and care team ID will sign off- call if needed

## 2023-03-31 NOTE — Progress Notes (Signed)
[facility]  - [place] at [facility]   PATIENT NAME: Rebecca [name]    MR#:  [id number]  DATE OF BIRTH:  30-May-1960  SUBJECTIVE:  Foley bag urine cleared out after bladder was flushed with Foley catheter by urology. Foley catheter removed. Patient continues to have suprapubic catheter. No fever. VITALS:  Blood pressure 137/74, pulse 89, temperature 98.2 F (36.8 C), resp. rate 18, height 5\' 4"  (1.626 m), weight 94.3 kg, SpO2 95%.  PHYSICAL EXAMINATION:   GENERAL:  63 y.o.-year-old patient with no acute distress. Morbidly obese bedbound LUNGS: decreased breath sounds bilaterally, no wheezing CARDIOVASCULAR: S1, S2 normal. No murmur   ABDOMEN: Soft, nontender, nondistended. Suprapubic catheter present EXTREMITIES: puffy bilateral lower extremity with chronic venous stasis changes NEUROLOGIC: nonfocal  patient is alert and awake SKIN per RN  LABORATORY PANEL:  CBC Recent Labs  Lab 03/31/23 0546  WBC 2.8*  HGB 9.3*  HCT 27.6*  PLT 184    Chemistries  Recent Labs  Lab 03/26/23 0424  NA 136  K 3.6  CL 103  CO2 23  GLUCOSE 102*  BUN 11  CREATININE 0.54  CALCIUM 8.6*    Assessment and Plan  RONNICA DREESE is a 63 y.o. female  with medical history significant of  MS, spasmatic paraplegia, bed-bound  neurogenic bladder, diastolic CHF, cardiac, chronic pancreatic insufficiency, lymphedema, breast cancer (s/p of right mastectomy), endocarditis, seizure, s/p of suprapubic catheter placement on 2/13.  She presented to the hospital with abdominal pain, fever, chills and bloody urine.   Sepsis due to complicated E. coli UTI/hematuria/? Traumatic Status post suprapubic catheter placement March 23, 2023 Neurogenic bladder -- patient continues to have bloody urine -- ID consultation -- currently on IV Rocephin according to blood culture sensitivities -- urology consultation appreciated-- consider CT cystogram to rule out traumatic fistula -- hemoglobin down to  9.7 -- spoke with urology PA Carollee Herter and she plans to irrigate the bladder to see if there is any remnant blood clot post procedure -- urine cleared out today. Hemoglobin remains stable. Foley catheter removed. Patient continues to have suprapubic catheter. --2/21-- ID recommends continue IV antibiotic for another 48 hours in the hospital and then changed to oral antibiotic for total of 14 days given complicated UTI. Patient is in agreement.  Hypokalemia Hyponatremia -- improved  Seizure disorder -- continue Keppra  Multiple sclerosis/neurogenic bladder was fully dependent however now has suprapubic catheter -- continue home meds  Pancreatic insufficiency -- on creon    Procedures: Family communication :none Consults : ID, urology CODE STATUS: full DVT Prophylaxis : SCD Level of care: Med-Surg Status is: Inpatient Remains inpatient appropriate because: complicated UTI-- continue antibiotics IV for another 48 hours per ID recommendation.    TOTAL TIME TAKING CARE OF THIS PATIENT: 35 minutes.  >50% time spent on counselling and coordination of care  Note: This dictation was prepared with Dragon dictation along with smaller phrase technology. Any transcriptional errors that result from this process are unintentional.  Enedina Finner M.D    [facility]   CC: Primary care physician; Danelle Berry, PA-C

## 2023-03-31 NOTE — Plan of Care (Signed)

## 2023-03-31 NOTE — Plan of Care (Signed)
   Problem: Education: Goal: Knowledge of General Education information will improve Description: Including pain rating scale, medication(s)/side effects and non-pharmacologic comfort measures Outcome: Progressing   Problem: Health Behavior/Discharge Planning: Goal: Ability to manage health-related needs will improve Outcome: Progressing   Problem: Clinical Measurements: Goal: Ability to maintain clinical measurements within normal limits will improve Outcome: Progressing Goal: Will remain free from infection Outcome: Progressing Goal: Diagnostic test results will improve Outcome: Progressing Goal: Respiratory complications will improve Outcome: Progressing Goal: Cardiovascular complication will be avoided Outcome: Progressing   Problem: Activity: Goal: Risk for activity intolerance will decrease Outcome: Progressing   Problem: Nutrition: Goal: Adequate nutrition will be maintained Outcome: Progressing   Problem: Coping: Goal: Level of anxiety will decrease Outcome: Progressing   Problem: Pain Managment: Goal: General experience of comfort will improve and/or be controlled Outcome: Progressing   Problem: Safety: Goal: Ability to remain free from injury will improve Outcome: Progressing   Problem: Skin Integrity: Goal: Risk for impaired skin integrity will decrease Outcome: Progressing

## 2023-03-31 NOTE — Progress Notes (Signed)
Urology Consult Follow Up  Subjective: Patient with clear yellow urine this am.    VSS  Hemoglobin at 9.3, but CBC and other labs still pending.   Good UOP.    Anti-infectives: Anti-infectives (From admission, onward)    Start     Dose/Rate Route Frequency Ordered Stop   03/27/23 1500  cefTRIAXone (ROCEPHIN) 2 g in sodium chloride 0.9 % 100 mL IVPB        2 g 200 mL/hr over 30 Minutes Intravenous Daily 03/27/23 1403     03/24/23 2200  meropenem (MERREM) 1 g in sodium chloride 0.9 % 100 mL IVPB  Status:  Discontinued        1 g 200 mL/hr over 30 Minutes Intravenous Every 8 hours 03/24/23 1852 03/27/23 1403   03/24/23 1315  ceFEPIme (MAXIPIME) 2 g in sodium chloride 0.9 % 100 mL IVPB        2 g 200 mL/hr over 30 Minutes Intravenous  Once 03/24/23 1310 03/24/23 1419   03/24/23 1315  metroNIDAZOLE (FLAGYL) IVPB 500 mg        500 mg 100 mL/hr over 60 Minutes Intravenous  Once 03/24/23 1310 03/24/23 1452   03/24/23 1315  vancomycin (VANCOCIN) IVPB 1000 mg/200 mL premix        1,000 mg 200 mL/hr over 60 Minutes Intravenous  Once 03/24/23 1310 03/24/23 1630       Current Facility-Administered Medications  Medication Dose Route Frequency Provider Last Rate Last Admin   acetaminophen (TYLENOL) tablet 650 mg  650 mg Oral Q6H PRN Lorretta Harp, MD   650 mg at 03/26/23 1433   amantadine (SYMMETREL) capsule 100 mg  100 mg Oral TID Lorretta Harp, MD   100 mg at 03/30/23 2142   baclofen (LIORESAL) tablet 10-20 mg  10-20 mg Oral TID Lorretta Harp, MD   10 mg at 03/30/23 2142   cefTRIAXone (ROCEPHIN) 2 g in sodium chloride 0.9 % 100 mL IVPB  2 g Intravenous Q1400 Lurene Shadow, MD 200 mL/hr at 03/30/23 1359 2 g at 03/30/23 1359   guaiFENesin-dextromethorphan (ROBITUSSIN DM) 100-10 MG/5ML syrup 5 mL  5 mL Oral Q4H PRN Lurene Shadow, MD   5 mL at 03/30/23 1356   levETIRAcetam (KEPPRA) tablet 500 mg  500 mg Oral BID Lorretta Harp, MD   500 mg at 03/30/23 2141   lipase/protease/amylase (CREON) capsule 72,000  Units  72,000 Units Oral TID Langley Gauss, MD   72,000 Units at 03/30/23 1820   And   lipase/protease/amylase (CREON) capsule 36,000 Units  36,000 Units Oral TID Langley Gauss, MD   36,000 Units at 03/30/23 1820   loratadine (CLARITIN) tablet 10 mg  10 mg Oral Daily Lorretta Harp, MD   10 mg at 03/30/23 1610   LORazepam (ATIVAN) injection 2 mg  2 mg Intravenous Q2H PRN Lorretta Harp, MD       multivitamin with minerals tablet 1 tablet  1 tablet Oral Daily Lorretta Harp, MD   1 tablet at 03/30/23 9604   nystatin (MYCOSTATIN/NYSTOP) topical powder 1 Application  1 Application Topical TID PRN Lorretta Harp, MD       ondansetron [facility]) injection 4 mg  4 mg Intravenous Q8H PRN Lorretta Harp, MD   4 mg at 03/27/23 0914   oxybutynin (DITROPAN-XL) 24 hr tablet 10 mg  10 mg Oral BID Lorretta Harp, MD   10 mg at 03/30/23 2142   oxyCODONE (Oxy IR/ROXICODONE) immediate release tablet 5 mg  5 mg Oral Q6H PRN  Lurene Shadow, MD   5 mg at 03/27/23 2205   pantoprazole (PROTONIX) EC tablet 40 mg  40 mg Oral Daily Lorretta Harp, MD   40 mg at 03/30/23 6045   polyethylene glycol (MIRALAX / GLYCOLAX) packet 17 g  17 g Oral Daily PRN Lorretta Harp, MD       tiZANidine (ZANAFLEX) tablet 4 mg  4 mg Oral QHS Lorretta Harp, MD   4 mg at 03/30/23 2142     Objective: Vital signs in last 24 hours: Temp:  [97.5 F (36.4 C)-98.2 F (36.8 C)] 98.2 F (36.8 C) (02/21 0442) Pulse Rate:  [90-95] 90 (02/21 0442) Resp:  [16-18] 18 (02/21 0442) BP: (131-163)/(64-74) 163/74 (02/21 0442) SpO2:  [94 %-98 %] 94 % (02/21 0442)  Intake/Output from previous day: 02/20 0701 - 02/21 0700 In: 600 [P.O.:600] Out: 1750 [Urine:1750] Intake/Output this shift: No intake/output data recorded.   Physical Exam Vitals and nursing note reviewed.  Constitutional:      Appearance: Normal appearance. She is obese. She is not ill-appearing.  HENT:     Head: Normocephalic and atraumatic.     Nose: Nose normal.     Mouth/Throat:     Mouth: Mucous membranes  are moist.  Eyes:     Extraocular Movements: Extraocular movements intact.     Conjunctiva/sclera: Conjunctivae normal.     Pupils: Pupils are equal, round, and reactive to light.  Pulmonary:     Effort: Pulmonary effort is normal.  Abdominal:     General: Abdomen is flat.     Palpations: Abdomen is soft.  Genitourinary:    Comments: SPT dressing clean and dry.   SPT draining clear yellow urine with good output.  Musculoskeletal:     Cervical back: Normal range of motion.  Neurological:     Mental Status: She is alert.     Lab Results:  Recent Labs    03/29/23 0550 03/30/23 0938 03/31/23 0546  WBC 3.3*  --  2.8*  HGB 10.1* 9.7* 9.3*  HCT 30.0*  --  27.6*  PLT 158  --  184   BMET No results for input(s): "NA", "K", "CL", "CO2", "GLUCOSE", "BUN", "CREATININE", "CALCIUM" in the last 72 hours. PT/INR No results for input(s): "LABPROT", "INR" in the last 72 hours. ABG No results for input(s): "PHART", "HCO3" in the last 72 hours.  Invalid input(s): "PCO2", "PO2"  Studies/Results: No results found.   Assessment and Plan: 63 year old female neurogenic bladder secondary to MS he is admitted for sepsis with pyelonephritis with associated gross hematuria he was recently converted to an SPT on March 23, 2023 where she subsequently developed fevers and gross hematuria and was admitted.  -Her urine is clear yellow this morning.  I did not retrieve a lot of clot material yesterday during irrigation, so I suspect that I repositioned the suprapubic tube during irrigation to a location in the bladder that does not cause bladder lining irritation.    -There is still low suspicion for a vesicovaginal fistula, so there is no plans for inpatient workup, but will continue to monitor as an outpatient  -antibiotics per primary team   -She has a follow-up appointment with [place] prior to her next suprapubic exchange with IR so we can obtain preprocedural urinalysis and urine culture    LOS: 7 days    [facility] PA-C  03/31/2023

## 2023-04-01 DIAGNOSIS — G35 Multiple sclerosis: Secondary | ICD-10-CM | POA: Diagnosis not present

## 2023-04-01 DIAGNOSIS — B9620 Unspecified Escherichia coli [E. coli] as the cause of diseases classified elsewhere: Secondary | ICD-10-CM

## 2023-04-01 DIAGNOSIS — N319 Neuromuscular dysfunction of bladder, unspecified: Secondary | ICD-10-CM | POA: Diagnosis not present

## 2023-04-01 DIAGNOSIS — N10 Acute tubulo-interstitial nephritis: Secondary | ICD-10-CM | POA: Diagnosis not present

## 2023-04-01 DIAGNOSIS — R7881 Bacteremia: Secondary | ICD-10-CM | POA: Diagnosis not present

## 2023-04-01 DIAGNOSIS — S3720XA Unspecified injury of bladder, initial encounter: Secondary | ICD-10-CM

## 2023-04-01 NOTE — Progress Notes (Signed)
 [facility]  - [place] at [facility]   PATIENT NAME: Rebecca [name]    MR#:  [id number]  DATE OF BIRTH:  04/01/1960  SUBJECTIVE:  overall improving. No fever. VITALS:  Blood pressure (!) 150/76, pulse 87, temperature 97.8 F (36.6 C), temperature source Oral, resp. rate 18, height 5\' 4"  (1.626 m), weight 94.3 kg, SpO2 94%.  PHYSICAL EXAMINATION:   GENERAL:  63 y.o.-year-old patient with no acute distress. Morbidly obese bedbound LUNGS: decreased breath sounds bilaterally, no wheezing CARDIOVASCULAR: S1, S2 normal. No murmur   ABDOMEN: Soft, nontender, nondistended. Suprapubic catheter present with yellow urine and Foley bag EXTREMITIES: puffy bilateral lower extremity with chronic venous stasis changes NEUROLOGIC: patient is alert and awake SKIN per RN  LABORATORY PANEL:  CBC Recent Labs  Lab 03/31/23 0546  WBC 2.8*  HGB 9.3*  HCT 27.6*  PLT 184    Chemistries  Recent Labs  Lab 03/26/23 0424  NA 136  K 3.6  CL 103  CO2 23  GLUCOSE 102*  BUN 11  CREATININE 0.54  CALCIUM 8.6*    Assessment and Plan  [name] is a 63 y.o. female  with medical history significant of  MS, spasmatic paraplegia, bed-bound  neurogenic bladder, diastolic CHF, cardiac, chronic pancreatic insufficiency, lymphedema, breast cancer (s/p of right mastectomy), endocarditis, seizure, s/p of suprapubic catheter placement on 2/13.  She presented to the hospital with abdominal pain, fever, chills and bloody urine.   Sepsis due to complicated E. coli UTI/hematuria/? Traumatic Status post suprapubic catheter placement March 23, 2023 Neurogenic bladder -- patient continues to have bloody urine -- ID consultation -- currently on IV Rocephin according to blood culture sensitivities -- urology consultation appreciated-- consider CT cystogram to rule out traumatic fistula -- hemoglobin down to 9.7 -- spoke with urology PA Carollee Herter and she plans to irrigate the bladder to see if  there is any remnant blood clot post procedure -- urine cleared out today. Hemoglobin remains stable. Foley catheter removed. Patient continues to have suprapubic catheter. --2/21-- ID recommends continue IV antibiotic for another 48 hours in the hospital and then changed to oral antibiotic for total of 14 days given complicated UTI. Patient is in agreement. --2/22-- remains stable. Continue antibiotics for another 24 hours.  Hypokalemia Hyponatremia -- improved  Seizure disorder -- continue Keppra  Multiple sclerosis/neurogenic bladder was fully dependent however now has suprapubic catheter -- continue home meds  Pancreatic insufficiency -- on creon    Procedures: Family communication :none Consults : ID, urology CODE STATUS: full DVT Prophylaxis : SCD Level of care: Med-Surg Status is: Inpatient Remains inpatient appropriate because: complicated UTI-- continue antibiotics IV for another 24 hours per ID recommendation. Patient will discharge tomorrow if remains stable. She is in agreement    TOTAL TIME TAKING CARE OF THIS PATIENT: 35 minutes.  >50% time spent on counselling and coordination of care  Note: This dictation was prepared with Dragon dictation along with smaller phrase technology. Any transcriptional errors that result from this process are unintentional.  Enedina Finner M.D    [facility]   CC: Primary care physician; Danelle Berry, PA-C

## 2023-04-02 DIAGNOSIS — N390 Urinary tract infection, site not specified: Secondary | ICD-10-CM | POA: Diagnosis not present

## 2023-04-02 NOTE — Discharge Summary (Signed)
 Physician Discharge Summary   Patient: Rebecca [name] MRN: [id number] DOB: [birth year]  Admit date:     03/24/2023  Discharge date: 04/02/23  Discharge Physician: Enedina Finner   PCP: Danelle Berry, PA-C   Recommendations at discharge:    F/u Urology Dr Lonna Cobb on your appt F/u PCP in 1-2 weeks  Discharge Diagnoses: Principal Problem:   Complicated UTI (urinary tract infection) Active Problems:   Acute pyelonephritis   Sepsis (HCC)   Seizure disorder (HCC)   MS (multiple sclerosis) (HCC)   Neurogenic bladder   Pancreatic insufficiency   Chronic diastolic CHF (congestive heart failure) (HCC)   Obesity (BMI 30-39.9)   Bacteremia due to Escherichia coli   Traumatic injury of bladder   Rebecca [name] is a 63 y.o. female  with medical history significant of  MS, spasmatic paraplegia, bed-bound  neurogenic bladder, diastolic CHF, cardiac, chronic pancreatic insufficiency, lymphedema, breast cancer (s/p of right mastectomy), endocarditis, seizure, s/p of suprapubic catheter placement on 2/13.  She presented to the hospital with abdominal pain, fever, chills and bloody urine.    Sepsis due to complicated E. coli UTI/hematuria/? Traumatic Status post suprapubic catheter placement March 23, 2023 Neurogenic bladder -- patient continues to have bloody urine -- ID consultation appreciated -- currently on IV Rocephin according to blood culture sensitivities -- urology consultation appreciated-- consider CT cystogram to rule out traumatic fistula -- hemoglobin down to 9.7 -- spoke with urology PA Carollee Herter and she plans to irrigate the bladder to see if there is any remnant blood clot post procedure -- urine cleared out today. Hemoglobin remains stable. Foley catheter removed. Patient continues to have suprapubic catheter. --2/21-- ID recommends continue IV antibiotic for another 48 hours in the hospital and then changed to oral antibiotic for total of 14 days given complicated UTI. Patient is  in agreement. --2/22-- remains stable. Continue antibiotics for another 24 hours. --2/23--doing overall well. Will d/c to home with outpt po abx. Pt will f/u Urology   Hypokalemia Hyponatremia -- improved   Seizure disorder -- continue Keppra   Multiple sclerosis/neurogenic bladder was fully dependent however now has suprapubic catheter -- continue home meds   Pancreatic insufficiency -- on creon       Procedures: Irrigation of bladder with foley (removed) Family communication :none Consults : ID, urology CODE STATUS: full DVT Prophylaxis : SCD Level of care: Med-Surg      Pain control - [place] Controlled Substance Reporting System database was reviewed. and patient was instructed, not to drive, operate heavy machinery, perform activities at heights, swimming or participation in water activities or provide baby-sitting services while on Pain, Sleep and Anxiety Medications; until their outpatient Physician has advised to do so again. Also recommended to not to take more than prescribed Pain, Sleep and Anxiety Medications.  Diet recommendation:  Discharge Diet Orders (From admission, onward)     Start     Ordered   04/02/23 0000  Diet - low sodium heart healthy        04/02/23 1009           DISCHARGE MEDICATION: Allergies as of 04/02/2023       Reactions   Fentanyl Nausea And Vomiting, Nausea Only   vomiting Was given in PACU x3 each time patient got sick   Sulfa Antibiotics Hives, Other (See Comments)   Light headed, over heated        Medication List     TAKE these medications    amantadine 100 MG  capsule Commonly known as: SYMMETREL TAKE 1 CAPSULE BY MOUTH THREE TIMES A DAY   baclofen 10 MG tablet Commonly known as: LIORESAL Take 1-2 tablets (10-20 mg total) by mouth 3 (three) times daily.   cefadroxil 500 MG capsule Commonly known as: DURICEF Take 2 capsules (1,000 mg total) by mouth 2 (two) times daily for 6 days. Start taking on:  April 03, 2023   COSAMIN DS PO Take 1 tablet by mouth 3 (three) times daily.   gluconic acid-citric acid irrigation Insert 30 mL into suprapubic tube and clamp tube for 10 minutes and then drain twice daily   Leg Vein & Circulation Tabs Take 1 tablet by mouth 2 (two) times daily.   levETIRAcetam 500 MG tablet Commonly known as: KEPPRA Take 1 tablet (500 mg total) by mouth 2 (two) times daily.   lipase/protease/amylase [address] UNITS Cpep capsule Commonly known as: Creon Take 2 capsules (72,000 Units total) by mouth 3 (three) times daily with meals AND 1 capsule (36,000 Units total) with snacks. What changed: See the new instructions.   loratadine 10 MG tablet Commonly known as: CLARITIN Take 10 mg by mouth daily.   MULTIVITAMIN PO Take 1 tablet by mouth daily.   nystatin powder Commonly known as: MYCOSTATIN/NYSTOP Apply 1 application topically 3 (three) times daily as needed. For rash/raw skin as needed   omeprazole 40 MG capsule Commonly known as: PRILOSEC TAKE 1 CAPSULE (40 MG TOTAL) BY MOUTH DAILY.   oxybutynin 10 MG 24 hr tablet Commonly known as: DITROPAN-XL Take 1 tablet (10 mg total) by mouth 2 (two) times daily.   Rebif Rebidose 44 MCG/0.5ML Soaj Generic drug: Interferon Beta-1a INJECT 1 PEN UNDER THE SKIN 3 TIMES PER WEEK   tiZANidine 4 MG tablet Commonly known as: ZANAFLEX Take 1 tablet (4 mg total) by mouth at bedtime.   Turmeric 500 MG Caps Take 500 mg by mouth daily.               Discharge Care Instructions  (From admission, onward)           Start     Ordered   04/02/23 0000  Discharge wound care:       Comments: Pressure Injury 03/24/23 Sacrum Upper Stage 2 -  Partial thickness loss of dermis presenting as a shallow open injury with a red, pink wound bed without slough. two areas to sacrum   04/02/23 1009            Follow-up Information     Danelle Berry, PA-C Follow up.   Specialty: Family Medicine Why: Hospital follow  up Contact information: [address] [place] [address] [phone]         Riki Altes, MD. Go to.   Specialty: Urology Why: on your post suprapubic catheter placement appt Contact information: [address] [place] [address] [phone]                Discharge Exam: Ceasar Mons Weights   03/24/23 1301  Weight: 94.3 kg  GENERAL:  62 y.o.-year-old patient with no acute distress. Morbidly obese bedbound LUNGS: decreased breath sounds bilaterally, no wheezing CARDIOVASCULAR: S1, S2 normal. No murmur   ABDOMEN: Soft, nontender, nondistended. Suprapubic catheter present with yellow urine and Foley bag EXTREMITIES: puffy bilateral lower extremity with chronic venous stasis changes NEUROLOGIC: patient is alert and awake, bilateral LE flaccid weakness (functional) SKIN per RN   Condition at discharge: fair  The results of significant diagnostics  from this hospitalization (including imaging, microbiology, ancillary and laboratory) are listed below for reference.   Imaging Studies: US PELVIS LIMITED (TRANSABDOMINAL ONLY) Result Date: 03/28/2023 CLINICAL DATA:  Fever and hematuria. Evaluate urinary bladder. Suprapubic catheter in place. EXAM: LIMITED ULTRASOUND OF PELVIS TECHNIQUE: Limited transabdominal ultrasound examination of the pelvis was performed. COMPARISON:  Pelvic ultrasound dated 11/20/2018. CT abdomen pelvis dated 03/24/2023. FINDINGS: The urinary bladder is not visualized. A 2.7 x 2.4 x 2.2 cm hypoechoic lesion the pelvis, likely a fibroid. IMPRESSION: Nonvisualization of the urinary bladder. Electronically Signed   By: Elgie Collard M.D.   On: 03/28/2023 19:10   CT ABDOMEN PELVIS W CONTRAST Result Date: 03/24/2023 CLINICAL DATA:  Lower abdominal pain, fever, tachycardia, sepsis. Chest had suprapubic catheter placed yesterday. Evaluating for infection EXAM: CT ABDOMEN AND PELVIS WITH CONTRAST TECHNIQUE: Multidetector CT  imaging of the abdomen and pelvis was performed using the standard protocol following bolus administration of intravenous contrast. RADIATION DOSE REDUCTION: This exam was performed according to the departmental dose-optimization program which includes automated exposure control, adjustment of the mA and/or kV according to patient size and/or use of iterative reconstruction technique. CONTRAST:  OMNIPAQUE IOHEXOL 300 MG/ML  SOLN COMPARISON:  CT abdomen pelvis 03/08/2023 FINDINGS: Lower chest: Small to moderate volume hiatal hernia. Hepatobiliary: No focal liver abnormality. Status post cholecystectomy. No biliary dilatation. Pancreas: No focal lesion. Normal pancreatic contour. No surrounding inflammatory changes. No main pancreatic ductal dilatation. Spleen: Normal in size without focal abnormality. Adrenals/Urinary Tract: No adrenal nodule bilaterally. Striated nephrograms bilaterally and bilateral urothelial thickening. No hydronephrosis. No hydroureter.  No nephroureterolithiasis. The urinary bladder is decompressed with suprapubic catheter terminating within its lumen. Urinary bladder wall thickening and perivesicular fat stranding. On delayed imaging, there is no urothelial wall thickening and there are no filling defects in the opacified portions of the bilateral collecting systems or ureters. Stomach/Bowel: Stomach is within normal limits. No evidence of bowel wall thickening or dilatation. Stool throughout the colon. Appendix appears normal. Vascular/Lymphatic: No abdominal aorta or iliac aneurysm. Moderate to severe atherosclerotic plaque of the aorta and its branches. No abdominal, pelvic, or inguinal lymphadenopathy. Reproductive: The uterus is lobulated and heterogeneous suggestive of underlying uterine fibroids. Uterus and bilateral adnexa are unremarkable. Other: No intraperitoneal free fluid. No intraperitoneal free gas. No organized fluid collection. Musculoskeletal: No abdominal wall hernia  or abnormality.  Diastasis rectus No suspicious lytic or blastic osseous lesions. No acute displaced fracture. Multilevel degenerative changes of the spine. IMPRESSION: 1. Bilateral pyelonephritis and cystitis. 2. Constipation. 3. Small to moderate volume hiatal hernia. Electronically Signed   By: Tish Frederickson M.D.   On: 03/24/2023 17:29   DG Chest Port 1 View Result Date: 03/24/2023 CLINICAL DATA:  Possible sepsis. EXAM: PORTABLE CHEST 1 VIEW COMPARISON:  03/08/2023 FINDINGS: Normal sized heart. Clear lungs with normal vascularity. Cervical spine fixation hardware. IMPRESSION: No acute abnormality. Electronically Signed   By: Beckie Salts M.D.   On: 03/24/2023 15:57   IR Aspiration Bladder Insert Suprapubic Catheter Result Date: 03/23/2023 INDICATION: MS, neurogenic bladder EXAM: ULTRASOUND FLUOROSCOPIC 16 FRENCH SUPRAPUBIC CATHETER PLACEMENT MEDICATIONS: 1% LIDOCAINE LOCAL ANESTHESIA/SEDATION: Moderate (conscious) sedation was employed during this procedure. A total of Versed 3.0 mg and 0.5 mg Dilaudid was administered intravenously by the radiology nurse. Total intra-service moderate Sedation Time: 20 minutes. The patient's level of consciousness and vital signs were monitored continuously by radiology nursing throughout the procedure under my direct supervision. COMPLICATIONS: None immediate. Fluoroscopy: 16 mGy PROCEDURE: Informed written consent was  obtained from the patient after a thorough discussion of the procedural risks, benefits and alternatives. All questions were addressed. Maximal Sterile Barrier Technique was utilized including caps, mask, sterile gowns, sterile gloves, sterile drape, hand hygiene and skin antiseptic. A timeout was performed prior to the initiation of the procedure. Utilizing the existing Foley catheter, saline was instilled to distend the bladder. Bladder was visualized in the midline with ultrasound. Under sterile conditions and local anesthesia, percutaneous needle  access performed with an 18 gauge 15 cm needle. Needle position confirmed with ultrasound. There was return of blood tinged urine. Guidewire inserted followed by balloon tract dilatation to advance a 16 French catheter. Catheter position confirmed with fluoroscopy and contrast injection. Catheter secured with a silk suture and a sterile dressing. Gravity drainage bag connected. No immediate complication. Patient tolerated the procedure well. IMPRESSION: Successful ultrasound and fluoroscopic 16 French suprapubic catheter placement. Electronically Signed   By: Judie Petit.  Shick M.D.   On: 03/23/2023 15:09   CT ABDOMEN PELVIS W CONTRAST Result Date: 03/08/2023 CLINICAL DATA:  Abdominal pain.  Diarrhea. EXAM: CT ABDOMEN AND PELVIS WITH CONTRAST TECHNIQUE: Multidetector CT imaging of the abdomen and pelvis was performed using the standard protocol following bolus administration of intravenous contrast. RADIATION DOSE REDUCTION: This exam was performed according to the departmental dose-optimization program which includes automated exposure control, adjustment of the mA and/or kV according to patient size and/or use of iterative reconstruction technique. CONTRAST:  OMNIPAQUE IOHEXOL 300 MG/ML  SOLN COMPARISON:  None Available. FINDINGS: Lower chest: Lung bases are clear. Hepatobiliary: No focal hepatic lesion. Postcholecystectomy. No biliary dilatation. Pancreas: Pancreas is normal. No ductal dilatation. No pancreatic inflammation. Spleen: Normal spleen Adrenals/urinary tract: Adrenal glands are normal. Kidneys enhance symmetrically. There is a hypoenhancement in the cortex of the mid LEFT kidney RIGHT kidney over a 2.5 cm segment (image 36/2) similar cortical hypoenhancement to lesser degree in the upper pole of the LEFT kidney (image 23/2) ureters are normal. No ureteral enhancement. Foley catheter within collapsed bladder. There is mucosal enhancement within the collapsed bladder. On delayed imaging there is no  filling defects within collecting systems or proximal ureters. The cortical hypoenhancement is evident on delayed imaging with 2 foci in the RIGHT kidney. Region of hypoenhancement in the LEFT kidney is more prominent on delayed phase. Stomach/Bowel: Large hiatal hernia. Stomach, duodenum small-bowel normal. Post appendectomy. Small volume of free fluid along the RIGHT pericolic gutter. No evidence of colonic inflammation. Moderate volume stool. Vascular/Lymphatic: Abdominal aorta is normal caliber with atherosclerotic calcification. There is no retroperitoneal or periportal lymphadenopathy. No pelvic lymphadenopathy. Reproductive: Uterus and adnexa unremarkable. Other: No free fluid. Musculoskeletal: No aggressive osseous lesion. IMPRESSION: 1. Segments of cortical hypoenhancement in the RIGHT kidney and LEFT kidney most consistent with multifocal focal pyelonephritis. Renal infarctions are considered but not favored. 2. No evidence of ureteritis. 3. Mucosal enhancement within the collapsed bladder suggests cystitis. Foley catheter in place. 4. No evidence of bowel inflammation. Electronically Signed   By: Genevive Bi M.D.   On: 03/08/2023 19:47   DG Chest Port 1 View Result Date: 03/08/2023 CLINICAL DATA:  Fever. EXAM: PORTABLE CHEST 1 VIEW COMPARISON:  12/14/2020 FINDINGS: Stable positioning of left chest port. The heart is normal in size, mediastinal contours are stable. Mild retrocardiac atelectasis or scarring. No confluent airspace disease. No pleural effusion or pneumothorax. IMPRESSION: Retrocardiac atelectasis or scarring. No confluent airspace disease. Electronically Signed   By: Narda Rutherford M.D.   On: 03/08/2023 16:39  Microbiology: Results for orders placed or performed during the hospital encounter of 03/24/23  Blood Culture (routine x 2)     Status: None   Collection Time: 03/24/23  1:21 PM   Specimen: BLOOD LEFT ARM  Result Value Ref Range Status   Specimen Description BLOOD  LEFT ARM  Final   Special Requests   Final    BOTTLES DRAWN AEROBIC AND ANAEROBIC Blood Culture results may not be optimal due to an inadequate volume of blood received in culture bottles   Culture   Final    NO GROWTH 5 DAYS Performed at [facility], [address] [place], [place] [address]    Report Status 03/29/2023 FINAL  Final  Resp panel by RT-PCR (RSV, Flu A&B, Covid) Anterior Nasal Swab     Status: None   Collection Time: 03/24/23  1:30 PM   Specimen: Anterior Nasal Swab  Result Value Ref Range Status   SARS Coronavirus 2 by RT PCR NEGATIVE NEGATIVE Final    Comment: (NOTE) SARS-CoV-2 target nucleic acids are NOT DETECTED.  The SARS-CoV-2 RNA is generally detectable in upper respiratory specimens during the acute phase of infection. The lowest concentration of SARS-CoV-2 viral copies this assay can detect is 138 copies/mL. A negative result does not preclude SARS-Cov-2 infection and should not be used as the sole basis for treatment or other patient management decisions. A negative result may occur with  improper specimen collection/handling, submission of specimen other than nasopharyngeal swab, presence of viral mutation(s) within the areas targeted by this assay, and inadequate number of viral copies(<138 copies/mL). A negative result must be combined with clinical observations, patient history, and epidemiological information. The expected result is Negative.  Fact Sheet for Patients:  BloggerCourse.com  Fact Sheet for Healthcare Providers:  SeriousBroker.it  This test is no t yet approved or cleared by the [place] FDA and  has been authorized for detection and/or diagnosis of SARS-CoV-2 by FDA under an Emergency Use Authorization (EUA). This EUA will remain  in effect (meaning this test can be used) for the duration of the COVID-19 declaration under Section 564(b)(1) of the Act, 21 U.S.C.section  360bbb-3(b)(1), unless the authorization is terminated  or revoked sooner.       Influenza A by PCR NEGATIVE NEGATIVE Final   Influenza B by PCR NEGATIVE NEGATIVE Final    Comment: (NOTE) The Xpert Xpress SARS-CoV-2/FLU/RSV plus assay is intended as an aid in the diagnosis of influenza from Nasopharyngeal swab specimens and should not be used as a sole basis for treatment. Nasal washings and aspirates are unacceptable for Xpert Xpress SARS-CoV-2/FLU/RSV testing.  Fact Sheet for Patients: BloggerCourse.com  Fact Sheet for Healthcare Providers: SeriousBroker.it  This test is not yet approved or cleared by the [place] FDA and has been authorized for detection and/or diagnosis of SARS-CoV-2 by FDA under an Emergency Use Authorization (EUA). This EUA will remain in effect (meaning this test can be used) for the duration of the COVID-19 declaration under Section 564(b)(1) of the Act, 21 U.S.C. section 360bbb-3(b)(1), unless the authorization is terminated or revoked.     Resp Syncytial Virus by PCR NEGATIVE NEGATIVE Final    Comment: (NOTE) Fact Sheet for Patients: BloggerCourse.com  Fact Sheet for Healthcare Providers: SeriousBroker.it  This test is not yet approved or cleared by the [place] FDA and has been authorized for detection and/or diagnosis of SARS-CoV-2 by FDA under an Emergency Use Authorization (EUA). This EUA will remain in effect (meaning this test  can be used) for the duration of the COVID-19 declaration under Section 564(b)(1) of the Act, 21 U.S.C. section 360bbb-3(b)(1), unless the authorization is terminated or revoked.  Performed at [facility], [address] [place], [place] [address]   Urine Culture     Status: Abnormal   Collection Time: 03/24/23  1:30 PM   Specimen: Urine, Random  Result Value Ref Range Status   Specimen  Description   Final    URINE, RANDOM Performed at [facility], [address] [place], [place] [address]    Special Requests   Final    NONE Reflexed from [id number] Performed at [facility], [address] [place], [place] [address]    Culture >=100,000 COLONIES/mL ESCHERICHIA COLI (A)  Final   Report Status 03/26/2023 FINAL  Final   Organism ID, Bacteria ESCHERICHIA COLI (A)  Final      Susceptibility   Escherichia coli - MIC*    AMPICILLIN >=32 RESISTANT Resistant     CEFAZOLIN <=4 SENSITIVE Sensitive     CEFEPIME <=0.12 SENSITIVE Sensitive     CEFTRIAXONE <=0.25 SENSITIVE Sensitive     CIPROFLOXACIN >=4 RESISTANT Resistant     GENTAMICIN <=1 SENSITIVE Sensitive     IMIPENEM <=0.25 SENSITIVE Sensitive     NITROFURANTOIN 32 SENSITIVE Sensitive     TRIMETH/SULFA <=20 SENSITIVE Sensitive     AMPICILLIN/SULBACTAM 8 SENSITIVE Sensitive     PIP/TAZO <=4 SENSITIVE Sensitive ug/mL    * >=100,000 COLONIES/mL ESCHERICHIA COLI  Blood Culture (routine x 2)     Status: Abnormal   Collection Time: 03/24/23  1:40 PM   Specimen: BLOOD  Result Value Ref Range Status   Specimen Description   Final    BLOOD BLOOD LEFT ARM Performed at [facility], [address], [place], [place] [address]    Special Requests   Final    BOTTLES DRAWN AEROBIC AND ANAEROBIC Blood Culture adequate volume Performed at [facility], [address] [place], [place] [address]    Culture  Setup Time   Final    GRAM NEGATIVE RODS AEROBIC BOTTLE ONLY CRITICAL RESULT CALLED TO, READ BACK BY AND VERIFIED WITH: CAROLYN COULTER @1837  03/26/23 LFD    Culture ESCHERICHIA COLI (A)  Final   Report Status 03/29/2023 FINAL  Final   Organism ID, Bacteria ESCHERICHIA COLI  Final   Organism ID, Bacteria ESCHERICHIA COLI  Final      Susceptibility   Escherichia coli - KIRBY BAUER*    CEFAZOLIN SENSITIVE Sensitive    Escherichia coli - MIC*    AMPICILLIN >=32 RESISTANT Resistant      CEFEPIME <=0.12 SENSITIVE Sensitive     CEFTAZIDIME <=1 SENSITIVE Sensitive     CEFTRIAXONE <=0.25 SENSITIVE Sensitive     CIPROFLOXACIN >=4 RESISTANT Resistant     GENTAMICIN <=1 SENSITIVE Sensitive     IMIPENEM <=0.25 SENSITIVE Sensitive     TRIMETH/SULFA <=20 SENSITIVE Sensitive     AMPICILLIN/SULBACTAM 8 SENSITIVE Sensitive     PIP/TAZO <=4 SENSITIVE Sensitive ug/mL    * ESCHERICHIA COLI    ESCHERICHIA COLI  Blood Culture ID Panel (Reflexed)     Status: Abnormal   Collection Time: 03/24/23  1:40 PM  Result Value Ref Range Status   Enterococcus faecalis NOT DETECTED NOT DETECTED Final   Enterococcus Faecium NOT DETECTED NOT DETECTED Final   Listeria monocytogenes NOT DETECTED NOT DETECTED Final   Staphylococcus species NOT DETECTED NOT DETECTED Final   Staphylococcus aureus (BCID)  NOT DETECTED NOT DETECTED Final   Staphylococcus epidermidis NOT DETECTED NOT DETECTED Final   Staphylococcus lugdunensis NOT DETECTED NOT DETECTED Final   Streptococcus species NOT DETECTED NOT DETECTED Final   Streptococcus agalactiae NOT DETECTED NOT DETECTED Final   Streptococcus pneumoniae NOT DETECTED NOT DETECTED Final   Streptococcus pyogenes NOT DETECTED NOT DETECTED Final   A.calcoaceticus-baumannii NOT DETECTED NOT DETECTED Final   Bacteroides fragilis NOT DETECTED NOT DETECTED Final   Enterobacterales DETECTED (A) NOT DETECTED Final    Comment: Enterobacterales represent a large order of gram negative bacteria, not a single organism. CRITICAL RESULT CALLED TO, READ BACK BY AND VERIFIED WITH: CAROLYN COULTER @ 1837 03/26/23 LFD    Enterobacter cloacae complex NOT DETECTED NOT DETECTED Final   Escherichia coli DETECTED (A) NOT DETECTED Final    Comment: CRITICAL RESULT CALLED TO, READ BACK BY AND VERIFIED WITH: CAROLYN COULTER @ 1837 03/26/23 LFD    Klebsiella aerogenes NOT DETECTED NOT DETECTED Final   Klebsiella oxytoca NOT DETECTED NOT DETECTED Final   Klebsiella pneumoniae NOT  DETECTED NOT DETECTED Final   Proteus species NOT DETECTED NOT DETECTED Final   Salmonella species NOT DETECTED NOT DETECTED Final   Serratia marcescens NOT DETECTED NOT DETECTED Final   Haemophilus influenzae NOT DETECTED NOT DETECTED Final   Neisseria meningitidis NOT DETECTED NOT DETECTED Final   Pseudomonas aeruginosa NOT DETECTED NOT DETECTED Final   Stenotrophomonas maltophilia NOT DETECTED NOT DETECTED Final   Candida albicans NOT DETECTED NOT DETECTED Final   Candida auris NOT DETECTED NOT DETECTED Final   Candida glabrata NOT DETECTED NOT DETECTED Final   Candida krusei NOT DETECTED NOT DETECTED Final   Candida parapsilosis NOT DETECTED NOT DETECTED Final   Candida tropicalis NOT DETECTED NOT DETECTED Final   Cryptococcus neoformans/gattii NOT DETECTED NOT DETECTED Final   CTX-M ESBL NOT DETECTED NOT DETECTED Final   Carbapenem resistance IMP NOT DETECTED NOT DETECTED Final   Carbapenem resistance KPC NOT DETECTED NOT DETECTED Final   Carbapenem resistance NDM NOT DETECTED NOT DETECTED Final   Carbapenem resist OXA 48 LIKE NOT DETECTED NOT DETECTED Final   Carbapenem resistance VIM NOT DETECTED NOT DETECTED Final    Comment: Performed at [facility], [address] [place], [place] [address]  Culture, blood (Routine X 2) w Reflex to ID Panel     Status: None (Preliminary result)   Collection Time: 03/29/23  9:34 PM   Specimen: BLOOD  Result Value Ref Range Status   Specimen Description BLOOD LEFT HAND  Final   Special Requests   Final    BOTTLES DRAWN AEROBIC ONLY Blood Culture adequate volume   Culture   Final    NO GROWTH 4 DAYS Performed at [facility], [address] [place], [place] [address]    Report Status PENDING  Incomplete  Culture, blood (Routine X 2) w Reflex to ID Panel     Status: None (Preliminary result)   Collection Time: 03/29/23  9:34 PM   Specimen: BLOOD  Result Value Ref Range Status   Specimen Description BLOOD LEFT ASSIST  CONTROL  Final   Special Requests   Final    BOTTLES DRAWN AEROBIC AND ANAEROBIC Blood Culture adequate volume   Culture   Final    NO GROWTH 4 DAYS Performed at [facility], [address] [place], [place] [address]    Report Status PENDING  Incomplete   *Note: Due to a large number of results and/or encounters for the  requested time period, some results have not been displayed. A complete set of results can be found in Results Review.    Labs: CBC: Recent Labs  Lab 03/29/23 0550 03/30/23 0938 03/31/23 0546  WBC 3.3*  --  2.8*  NEUTROABS  --   --  1.2*  HGB 10.1* 9.7* 9.3*  HCT 30.0*  --  27.6*  MCV 86.5  --  87.1  PLT 158  --  184    Discharge time spent: greater than 30 minutes.  Signed: Enedina Finner, MD [facility] 04/02/2023

## 2023-04-02 NOTE — TOC Transition Note (Addendum)
 Transition of Care [facility]) - Discharge Note   Patient Details  Name: Rebecca [name] MRN: [id number] Date of Birth: 09-02-1960  Transition [facility]) CM/SW Contact:  Bing Quarry, RN Phone Number: 04/02/2023, 10:24 AM   Clinical Narrative:  04/02/23: Patient has discharge orders to home/home health via [facility] for PT/OT/RN.  Was active with [facility] prior to admission and was confirmed for discharge on 03/26/23 by prior CM notes on 03/26/23. Patient will need transport to home via [facility] due to chronic illness issues per provider. EMS to be printed to unit and will call for transport when patient readied. DC Summary is in EPIC. Will notify [facility] agency of discharge today.   Transportation resources added to AVS via Care Coordination. [facility] notified of discharge to home transport need. 1112 am.     Gabriel Cirri MSN RN CM  RN Case Manager [place]  Transitions of Care Direct Dial: [phone] (Weekends Only) [facility] Main Office Phone: [phone] [facility] Fax: [phone] [email or web address]     Final next level of care: Home w Home Health Services Barriers to Discharge: Barriers Resolved   Patient Goals and CMS Choice            Discharge Placement                Patient to be transferred to facility by: [facility]   Patient and family notified of of transfer: 04/02/23  Discharge Plan and Services Additional resources added to the After Visit Summary for                  DME Arranged: N/A DME Agency: NA       HH Arranged: RN, PT, OT HH Agency: [facility] Date [facility] Agency Contacted: 03/26/23   Representative spoke with at [facility] Agency: Laurelyn Sickle per prior CM notes on 03/26/23.  Social Drivers of Health (SDOH) Interventions SDOH Screenings   Food Insecurity: No Food Insecurity (03/25/2023)  Housing: Low Risk  (03/25/2023)  Transportation Needs: Unmet Transportation Needs (03/25/2023)  Utilities: Not At Risk (03/25/2023)  Alcohol Screen: Low  Risk  (08/17/2021)  Depression (PHQ2-9): Low Risk  (02/20/2023)  Financial Resource Strain: Low Risk  (12/03/2022)  Physical Activity: Inactive (12/03/2022)  Social Connections: Moderately Isolated (03/14/2023)  Stress: No Stress Concern Present (12/03/2022)  Tobacco Use: Low Risk  (03/24/2023)  Health Literacy: Adequate Health Literacy (09/29/2022)     Readmission Risk Interventions     No data to display

## 2023-04-02 NOTE — Plan of Care (Signed)

## 2023-04-02 NOTE — Discharge Instructions (Signed)
 Transportation Resources  Agency Name: [facility] Agency Address: [address] [place], [place] [address] Phone: [phone] Email: [email or web address] Website: [email or web address] Service(s) Offered: Housing services, self-sufficiency, congregate meal program, weatherization program, Field seismologist program, emergency food assistance,  housing counseling, home ownership program, wheels-towork program.  Agency Name: [facility] [employer] [phone]) Address: [address], [place], [place] [address] Phone: [phone] Website: [email or web address] Service(s) Offered: Transportation for [employer], subscription and demand response; Dial-a-Ride for citizens 63 years of age or older.  Agency Name: Department of Social Services Address: [address] [place], [place] [address] Phone: [phone] Service(s) Offered: Child support services; child welfare services; food stamps; Medicaid; work first family assistance; and aid with fuel,  rent, food and medicine, transportation assistance.  Agency Name: Disabled [employer] (DAV) Transportation  Network Phone: [phone] Service(s) Offered: Transports veterans to the [facility] medical center. Call  forty-eight hours in advance and leave the name, telephone  number, date, and time of appointment. Veteran will be  contacted by the driver the day before the appointment to  arrange a pick up point   Transportation Resources  Agency Name: [facility] Agency Address: [address] [place], [place] [address] Phone: [phone] Email: [email or web address] Website: [email or web address] Service(s) Offered: Housing services, self-sufficiency, congregate meal program, weatherization program, Field seismologist program, emergency food assistance,  housing counseling, home ownership program, wheels-towork  program.  Agency Name: [facility] [employer] [phone]) Address: [address], [place], [place] [address] Phone: [phone] Website: [email or web address] Service(s) Offered: Transportation for [employer], subscription and demand response; Dial-a-Ride for citizens 63 years of age or older.  Agency Name: Department of Social Services Address: [address] [place], [place] [address] Phone: [phone] Service(s) Offered: Child support services; child welfare services; food stamps; Medicaid; work first family assistance; and aid with fuel,  rent, food and medicine, transportation assistance.  Agency Name: Disabled [employer] (DAV) Transportation  Network Phone: [phone] Service(s) Offered: Transports veterans to the [facility] medical center. Call  forty-eight hours in advance and leave the name, telephone  number, date, and time of appointment. Veteran will be  contacted by the driver the day before the appointment to  arrange a pick up point    [employer] ACTA currently provides door to door services. ACTA connects with PART daily for services to [facility]. ACTA also performs contract services to [employer] operates 27 vehicles, all but 3 mini-vans are equipped with lifts for special needs as well as the general public. ACTA drivers are each CDL certified and trained in First Aid and CPR. ACTA was established in 2002 by [employer]. An independent Industrial/product designer. ACTA operates via Cytogeneticist with required Research scientist (physical sciences) from [place]. ACTA provides over 80,000 passenger trips each year, including [facility] and [employer] sites.  Call at least by 11 AM one business day prior to needing transportation  [employer].                      [place], [place] [address]     Office  Hours: Monday-Friday  8 AM - 5 PM

## 2023-04-03 ENCOUNTER — Telehealth: Payer: Self-pay

## 2023-04-03 LAB — CULTURE, BLOOD (ROUTINE X 2)
Culture: NO GROWTH
Culture: NO GROWTH
Special Requests: ADEQUATE
Special Requests: ADEQUATE

## 2023-04-03 NOTE — Transitions of Care (Post Inpatient/ED Visit) (Signed)
   04/03/2023  Name: MILAGRO BELMARES MRN: [id number] DOB: 12/28/1960  Today's TOC FU Call Status: Today's TOC FU Call Status:: Unsuccessful Call (1st Attempt) Unsuccessful Call (1st Attempt) Date: 04/03/23  Attempted to reach the patient regarding the most recent Inpatient/ED visit. Patient was called in an Outreach attempt to offer VBCI  30-day TOC program. Pt is eligible for program due to potential risk for readmission and/or high utilization. Unfortunately, I was not able to speak with the patient in regards to recent hospital discharge   Left a HIPAA compliant phone message message for patient including VBCI CM contact information with request for a call back in regard to recent hospital discharge   Follow Up Plan: Additional outreach attempts will be made to reach the patient to complete the Transitions of Care (Post Inpatient/ED visit) call.   Susa Loffler , BSN, RN [facility]   VBCI-Population Health RN Care Manager Direct Dial [phone]  Fax: [phone] Website: [email or web address]

## 2023-04-04 ENCOUNTER — Telehealth: Payer: Self-pay

## 2023-04-04 DIAGNOSIS — N319 Neuromuscular dysfunction of bladder, unspecified: Secondary | ICD-10-CM | POA: Diagnosis not present

## 2023-04-04 DIAGNOSIS — G35 Multiple sclerosis: Secondary | ICD-10-CM | POA: Diagnosis not present

## 2023-04-04 DIAGNOSIS — K581 Irritable bowel syndrome with constipation: Secondary | ICD-10-CM | POA: Diagnosis not present

## 2023-04-04 DIAGNOSIS — Z9049 Acquired absence of other specified parts of digestive tract: Secondary | ICD-10-CM | POA: Diagnosis not present

## 2023-04-04 DIAGNOSIS — G894 Chronic pain syndrome: Secondary | ICD-10-CM | POA: Diagnosis not present

## 2023-04-04 DIAGNOSIS — M4802 Spinal stenosis, cervical region: Secondary | ICD-10-CM | POA: Diagnosis not present

## 2023-04-04 DIAGNOSIS — E876 Hypokalemia: Secondary | ICD-10-CM | POA: Diagnosis not present

## 2023-04-04 DIAGNOSIS — Z435 Encounter for attention to cystostomy: Secondary | ICD-10-CM | POA: Diagnosis not present

## 2023-04-04 DIAGNOSIS — H900 Conductive hearing loss, bilateral: Secondary | ICD-10-CM | POA: Diagnosis not present

## 2023-04-04 DIAGNOSIS — Z853 Personal history of malignant neoplasm of breast: Secondary | ICD-10-CM | POA: Diagnosis not present

## 2023-04-04 DIAGNOSIS — E871 Hypo-osmolality and hyponatremia: Secondary | ICD-10-CM | POA: Diagnosis not present

## 2023-04-04 DIAGNOSIS — T83511A Infection and inflammatory reaction due to indwelling urethral catheter, initial encounter: Secondary | ICD-10-CM | POA: Diagnosis not present

## 2023-04-04 DIAGNOSIS — G8220 Paraplegia, unspecified: Secondary | ICD-10-CM | POA: Diagnosis not present

## 2023-04-04 DIAGNOSIS — D649 Anemia, unspecified: Secondary | ICD-10-CM | POA: Diagnosis not present

## 2023-04-04 DIAGNOSIS — I890 Lymphedema, not elsewhere classified: Secondary | ICD-10-CM | POA: Diagnosis not present

## 2023-04-04 DIAGNOSIS — K8689 Other specified diseases of pancreas: Secondary | ICD-10-CM | POA: Diagnosis not present

## 2023-04-04 DIAGNOSIS — I081 Rheumatic disorders of both mitral and tricuspid valves: Secondary | ICD-10-CM | POA: Diagnosis not present

## 2023-04-04 DIAGNOSIS — R1310 Dysphagia, unspecified: Secondary | ICD-10-CM | POA: Diagnosis not present

## 2023-04-04 DIAGNOSIS — N10 Acute tubulo-interstitial nephritis: Secondary | ICD-10-CM | POA: Diagnosis not present

## 2023-04-04 DIAGNOSIS — I5032 Chronic diastolic (congestive) heart failure: Secondary | ICD-10-CM | POA: Diagnosis not present

## 2023-04-04 DIAGNOSIS — K219 Gastro-esophageal reflux disease without esophagitis: Secondary | ICD-10-CM | POA: Diagnosis not present

## 2023-04-04 NOTE — Transitions of Care (Post Inpatient/ED Visit) (Signed)
 04/04/2023  Name: Rebecca [name] MRN: [id number] DOB: 04-21-1960  Today's TOC FU Call Status: Today's TOC FU Call Status:: Successful TOC FU Call Completed Unsuccessful Call (1st Attempt) Date: 04/03/23 [facility] FU Call Complete Date: 04/04/23 Patient's Name and Date of Birth confirmed.  Transition Care Management Follow-up Telephone Call Date of Discharge: 04/02/23 Discharge Facility: [facility]) Type of Discharge: Inpatient Admission Primary Inpatient Discharge Diagnosis:: Complicated UTI (urinary tract infection) Acute pyelonephritis Sepsis How have you been since you were released from the hospital?: Better Any questions or concerns?: No  Items Reviewed: Did you receive and understand the discharge instructions provided?: Yes Medications obtained,verified, and reconciled?: Yes (Medications Reviewed) (Medication reconciliation completed based on recent discharge summary Patient taking medications as instructed and is aware of any changes or dosage adjustments medication regimen. Patient denies questions and reports no barriers to medication adherence) Any new allergies since your discharge?: No Dietary orders reviewed?: Yes Type of Diet Ordered:: Reg Heart Healthy Do you have support at home?: Yes People in Home: spouse Name of Support/Comfort Primary Source: Spouse Karilyn Cota she has HH aide 7 days a week  thru [employer]  Medications Reviewed Today: Medications Reviewed Today     Reviewed by Johnnette Barrios, RN (Registered Nurse) on 04/04/23 at 1020  Med List Status: <None>   Medication Order Taking? Sig Documenting Provider Last Dose Status Informant  amantadine (SYMMETREL) 100 MG capsule [id number] Yes TAKE 1 CAPSULE BY MOUTH THREE TIMES A DAY Penumalli, Vikram R, MD Taking Active Spouse/Significant Other  baclofen (LIORESAL) 10 MG tablet [id number] Yes Take 1-2 tablets (10-20 mg total) by mouth 3 (three) times daily. Penumalli, Glenford Bayley, MD  Taking Active Spouse/Significant Other  cefadroxil (DURICEF) 500 MG capsule [id number] Yes Take 2 capsules (1,000 mg total) by mouth 2 (two) times daily for 6 days. Lynn Ito, MD Taking Active   gluconic acid-citric acid (RENACIDIN) irrigation [id number] Yes Insert 30 mL into suprapubic tube and clamp tube for 10 minutes and then drain twice daily McGowan, Wellington Hampshire, PA-C Taking Active Spouse/Significant Other  Glucosamine-Chondroitin (COSAMIN DS PO) [id number] Yes Take 1 tablet by mouth 3 (three) times daily. [provider] Taking Active Spouse/Significant Other  levETIRAcetam (KEPPRA) 500 MG tablet [id number] Yes Take 1 tablet (500 mg total) by mouth 2 (two) times daily. Penumalli, Glenford Bayley, MD Taking Active Spouse/Significant Other  lipase/protease/amylase (CREON) 36000 UNITS CPEP capsule [id number] Yes Take 2 capsules (72,000 Units total) by mouth 3 (three) times daily with meals AND 1 capsule (36,000 Units total) with snacks.  Patient taking differently: Take 1-2 capsules (36,000- 72,000 Units total) by mouth with (three) times daily with meals AND 1 capsule (36,000 Units total) with snacks. (1 capsule by mouth with breakfast, 2 capsules by mouth with lunch and 1 capsule by mouth with dinner)     Celso Amy, PA-C Taking Active Spouse/Significant Other  loratadine (CLARITIN) 10 MG tablet [id number] Yes Take 10 mg by mouth daily. [provider] Taking Active Spouse/Significant Other  Misc Natural Products (LEG VEIN & CIRCULATION) TABS [id number] Yes Take 1 tablet by mouth 2 (two) times daily.  [provider] Taking Active Spouse/Significant Other  Multiple Vitamins-Minerals (MULTIVITAMIN PO) [id number] Yes Take 1 tablet by mouth daily.  [provider] Taking Active Spouse/Significant Other           Med Note Truman Hayward   Fri Feb 02, 2018  6:19 PM)    nystatin (MYCOSTATIN/NYSTOP) powder [id number] Yes Apply  1 application topically 3 (three) times  daily as needed. For rash/raw skin as needed Danelle Berry, PA-C Taking Active Spouse/Significant Other           Med Note Sharia Reeve   Fri Mar 24, 2023  6:50 PM) prn  omeprazole (PRILOSEC) 40 MG capsule [id number] Yes TAKE 1 CAPSULE (40 MG TOTAL) BY MOUTH DAILY. Danelle Berry, PA-C Taking Active Spouse/Significant Other  oxybutynin (DITROPAN-XL) 10 MG 24 hr tablet [id number] Yes Take 1 tablet (10 mg total) by mouth 2 (two) times daily. Harle Battiest, PA-C Taking Active Spouse/Significant Other  REBIF REBIDOSE 44 MCG/0.5ML SOAJ [id number] Yes INJECT 1 PEN UNDER THE SKIN 3 TIMES PER WEEK Penumalli, Glenford Bayley, MD Taking Active Spouse/Significant Other           Med Note (Osborne Serio L   Tue Mar 14, 2023 12:13 PM) Mon-Wed and Fri, if missed she adjusts days to get back on schedule   tiZANidine (ZANAFLEX) 4 MG tablet [id number] Yes Take 1 tablet (4 mg total) by mouth at bedtime. Suanne Marker, MD Taking Active Spouse/Significant Other  Turmeric 500 MG CAPS [id number] Yes Take 500 mg by mouth daily. [provider] Taking Active Spouse/Significant Other          Medication reconciliation / review completed based on most recent discharge summary and EHR medication list. Confirmed patient is taking all newly prescribed medications as instructed (any discrepancies are noted in review section)   Patient  is aware of any changes to and / or  any dosage adjustments to medication regimen. Patient denies questions at this time and reports no barriers to medication adherence.    Home Care and Equipment/Supplies: Were Home Health Services Ordered?: Yes Name of Home Health Agency:: [facility] [phone] Has Agency set up a time to come to your home?: Yes First Home Health Visit Date: 04/04/23 Any new equipment or medical supplies ordered?: Yes Name of Medical supply agency?: s/p catheter supplies Were you able to get the equipment/medical supplies?: Yes Do you have any questions  related to the use of the equipment/supplies?: No (Nurse visit today)  Functional Questionnaire: Do you need assistance with bathing/showering or dressing?: Yes Do you need assistance with meal preparation?: Yes Do you need assistance with eating?: No Do you have difficulty maintaining continence: No (has s/p catheter) Do you need assistance with getting out of bed/getting out of a chair/moving?: Yes Do you have difficulty managing or taking your medications?: Yes  Follow up appointments reviewed: PCP Follow-up appointment confirmed?: No (She will call and schedue with alternative providerr PCP is on LOA) MD Provider Line [phone] Given: No Specialist Hospital Follow-up appointment confirmed?: Yes Date of Specialist follow-up appointment?: 04/21/23 Follow-Up Specialty Provider:: Urology , 4/4 Port flush Do you need transportation to your follow-up appointment?: Yes (She uses ambo transport arranged thru Ins and practitioners office) Transportation Need Intervention Addressed By:: Other: Do you understand care options if your condition(s) worsen?: Yes-patient verbalized understanding  Interventions Today    Flowsheet Row Most Recent Value  Chronic Disease   Chronic disease during today's visit Other  [UTI]  General Interventions   General Interventions Discussed/Reviewed General Interventions Discussed, General Interventions Reviewed, Doctor Visits, Referral to Nurse  Doctor Visits Discussed/Reviewed Doctor Visits Discussed, Doctor Visits Reviewed, PCP, Specialist  PCP/Specialist Visits Compliance with follow-up visit  Exercise Interventions   Exercise Discussed/Reviewed Physical Activity  Physical Activity Discussed/Reviewed Physical Activity Reviewed  Mental Health Interventions   Mental Health Discussed/Reviewed Mental Health Discussed,  Coping Strategies  Nutrition Interventions   Nutrition Discussed/Reviewed Nutrition Reviewed  Pharmacy Interventions   Pharmacy  Dicussed/Reviewed Medications and their functions, Medication Adherence  Safety Interventions   Safety Discussed/Reviewed Safety Reviewed  Home Safety Assistive Devices       Benefits reviewed  Based on current information and Insurance plan -Reviewed benefits accessible to patient, including details about eligibility options for care and  available value based care options  if any areas of needs were identified.  Reviewed patient/  caregiver's ability to access and / or  ability with navigating the benefits system..Amb Referral made if indicted , refer to orders section of note for details   Reviewed goals for care Patient/ Caregiver  verbalizes understanding of instructions and care plan provided. Patient / Caregiver was encouraged to make informed decisions about their care, actively participate in managing their health condition, and implement lifestyle changes as needed to promote independence and self-management of health care. There were no reported  barriers to care.   TOC program  Patient is at high risk for readmission and/or has history of  high utilization  Discussed VBCI  TOC program and weekly calls to patient to assess condition/status, medication management  and provide support/education as indicated . Patient/ Caregiver voiced understanding and declined enrollment in the 30-day TOC Program.   She had supra-pubic catheter placed recently She is taking her medications She has help at home daily between family  and a private aide. She is a well kmow and current patient of [facility] with Nurse visit today She is aware of her benefits , and discharge instructions well versed in her POC and declined ned for additional follow-up     The patient has been provided with contact information for the care management team and has been advised to call with any health-related questions or concerns. Follow up as indicated with Care Team , or sooner should any new problems arise.      Susa Loffler , BSN, RN [facility]   VBCI-Population Health RN Care Manager Direct Dial [phone]  Fax: [phone] Website: [email or web address]

## 2023-04-05 DIAGNOSIS — G35 Multiple sclerosis: Secondary | ICD-10-CM | POA: Diagnosis not present

## 2023-04-05 DIAGNOSIS — I081 Rheumatic disorders of both mitral and tricuspid valves: Secondary | ICD-10-CM | POA: Diagnosis not present

## 2023-04-05 DIAGNOSIS — N10 Acute tubulo-interstitial nephritis: Secondary | ICD-10-CM | POA: Diagnosis not present

## 2023-04-05 DIAGNOSIS — N319 Neuromuscular dysfunction of bladder, unspecified: Secondary | ICD-10-CM | POA: Diagnosis not present

## 2023-04-05 DIAGNOSIS — K581 Irritable bowel syndrome with constipation: Secondary | ICD-10-CM | POA: Diagnosis not present

## 2023-04-05 DIAGNOSIS — Z435 Encounter for attention to cystostomy: Secondary | ICD-10-CM | POA: Diagnosis not present

## 2023-04-05 DIAGNOSIS — K219 Gastro-esophageal reflux disease without esophagitis: Secondary | ICD-10-CM | POA: Diagnosis not present

## 2023-04-05 DIAGNOSIS — Z9049 Acquired absence of other specified parts of digestive tract: Secondary | ICD-10-CM | POA: Diagnosis not present

## 2023-04-05 DIAGNOSIS — T83511A Infection and inflammatory reaction due to indwelling urethral catheter, initial encounter: Secondary | ICD-10-CM | POA: Diagnosis not present

## 2023-04-05 DIAGNOSIS — E876 Hypokalemia: Secondary | ICD-10-CM | POA: Diagnosis not present

## 2023-04-05 DIAGNOSIS — H900 Conductive hearing loss, bilateral: Secondary | ICD-10-CM | POA: Diagnosis not present

## 2023-04-05 DIAGNOSIS — G8220 Paraplegia, unspecified: Secondary | ICD-10-CM | POA: Diagnosis not present

## 2023-04-05 DIAGNOSIS — K8689 Other specified diseases of pancreas: Secondary | ICD-10-CM | POA: Diagnosis not present

## 2023-04-05 DIAGNOSIS — I890 Lymphedema, not elsewhere classified: Secondary | ICD-10-CM | POA: Diagnosis not present

## 2023-04-05 DIAGNOSIS — I5032 Chronic diastolic (congestive) heart failure: Secondary | ICD-10-CM | POA: Diagnosis not present

## 2023-04-05 DIAGNOSIS — G894 Chronic pain syndrome: Secondary | ICD-10-CM | POA: Diagnosis not present

## 2023-04-05 DIAGNOSIS — M4802 Spinal stenosis, cervical region: Secondary | ICD-10-CM | POA: Diagnosis not present

## 2023-04-05 DIAGNOSIS — R1310 Dysphagia, unspecified: Secondary | ICD-10-CM | POA: Diagnosis not present

## 2023-04-05 DIAGNOSIS — Z853 Personal history of malignant neoplasm of breast: Secondary | ICD-10-CM | POA: Diagnosis not present

## 2023-04-05 DIAGNOSIS — D649 Anemia, unspecified: Secondary | ICD-10-CM | POA: Diagnosis not present

## 2023-04-05 DIAGNOSIS — E871 Hypo-osmolality and hyponatremia: Secondary | ICD-10-CM | POA: Diagnosis not present

## 2023-04-05 NOTE — Telephone Encounter (Unsigned)
 Copied from CRM [id number]. Topic: Clinical - Home Health Verbal Orders >> Apr 05, 2023 11:15 AM Gildardo Pounds wrote: Caller/Agency: Trey Paula with [facility]  Callback Number: [phone] with confidential VM Service Requested: Skilled Nursing Frequency: 1 a week for 3 weeks and then every other week for 6 weeks Any new concerns about the patient? Yes; medication teaching; just released for urinary tract infection

## 2023-04-06 ENCOUNTER — Telehealth: Payer: Self-pay

## 2023-04-06 NOTE — Telephone Encounter (Signed)
 Verbal orders given

## 2023-04-06 NOTE — Telephone Encounter (Signed)
 Copied from CRM [id number]. Topic: Clinical - Home Health Verbal Orders >> Apr 06, 2023  3:46 PM Kristie Cowman wrote: Caller/Agency: [facility] Callback Number: [phone] Service Requested: Physical TherapyPhysical therapy for once a week for nine week.  Occupational therapy evaluation.   Any new concerns about the patient? No  [place] - [phone]

## 2023-04-07 NOTE — Telephone Encounter (Signed)
 Verbal order given

## 2023-04-11 DIAGNOSIS — N319 Neuromuscular dysfunction of bladder, unspecified: Secondary | ICD-10-CM | POA: Diagnosis not present

## 2023-04-11 DIAGNOSIS — K8689 Other specified diseases of pancreas: Secondary | ICD-10-CM | POA: Diagnosis not present

## 2023-04-11 DIAGNOSIS — M4802 Spinal stenosis, cervical region: Secondary | ICD-10-CM | POA: Diagnosis not present

## 2023-04-11 DIAGNOSIS — K219 Gastro-esophageal reflux disease without esophagitis: Secondary | ICD-10-CM | POA: Diagnosis not present

## 2023-04-11 DIAGNOSIS — T83511A Infection and inflammatory reaction due to indwelling urethral catheter, initial encounter: Secondary | ICD-10-CM | POA: Diagnosis not present

## 2023-04-11 DIAGNOSIS — D649 Anemia, unspecified: Secondary | ICD-10-CM | POA: Diagnosis not present

## 2023-04-11 DIAGNOSIS — Z853 Personal history of malignant neoplasm of breast: Secondary | ICD-10-CM | POA: Diagnosis not present

## 2023-04-11 DIAGNOSIS — R1310 Dysphagia, unspecified: Secondary | ICD-10-CM | POA: Diagnosis not present

## 2023-04-11 DIAGNOSIS — G8220 Paraplegia, unspecified: Secondary | ICD-10-CM | POA: Diagnosis not present

## 2023-04-11 DIAGNOSIS — Z435 Encounter for attention to cystostomy: Secondary | ICD-10-CM | POA: Diagnosis not present

## 2023-04-11 DIAGNOSIS — I890 Lymphedema, not elsewhere classified: Secondary | ICD-10-CM | POA: Diagnosis not present

## 2023-04-11 DIAGNOSIS — Z9049 Acquired absence of other specified parts of digestive tract: Secondary | ICD-10-CM | POA: Diagnosis not present

## 2023-04-11 DIAGNOSIS — E876 Hypokalemia: Secondary | ICD-10-CM | POA: Diagnosis not present

## 2023-04-11 DIAGNOSIS — I5032 Chronic diastolic (congestive) heart failure: Secondary | ICD-10-CM | POA: Diagnosis not present

## 2023-04-11 DIAGNOSIS — H900 Conductive hearing loss, bilateral: Secondary | ICD-10-CM | POA: Diagnosis not present

## 2023-04-11 DIAGNOSIS — I081 Rheumatic disorders of both mitral and tricuspid valves: Secondary | ICD-10-CM | POA: Diagnosis not present

## 2023-04-11 DIAGNOSIS — K581 Irritable bowel syndrome with constipation: Secondary | ICD-10-CM | POA: Diagnosis not present

## 2023-04-11 DIAGNOSIS — E871 Hypo-osmolality and hyponatremia: Secondary | ICD-10-CM | POA: Diagnosis not present

## 2023-04-11 DIAGNOSIS — N10 Acute tubulo-interstitial nephritis: Secondary | ICD-10-CM | POA: Diagnosis not present

## 2023-04-11 DIAGNOSIS — G35 Multiple sclerosis: Secondary | ICD-10-CM | POA: Diagnosis not present

## 2023-04-11 DIAGNOSIS — G894 Chronic pain syndrome: Secondary | ICD-10-CM | POA: Diagnosis not present

## 2023-04-15 ENCOUNTER — Other Ambulatory Visit: Payer: Self-pay | Admitting: Diagnostic Neuroimaging

## 2023-04-15 DIAGNOSIS — Z853 Personal history of malignant neoplasm of breast: Secondary | ICD-10-CM | POA: Diagnosis not present

## 2023-04-15 DIAGNOSIS — G894 Chronic pain syndrome: Secondary | ICD-10-CM | POA: Diagnosis not present

## 2023-04-15 DIAGNOSIS — I081 Rheumatic disorders of both mitral and tricuspid valves: Secondary | ICD-10-CM | POA: Diagnosis not present

## 2023-04-15 DIAGNOSIS — Z760 Encounter for issue of repeat prescription: Secondary | ICD-10-CM

## 2023-04-15 DIAGNOSIS — R1310 Dysphagia, unspecified: Secondary | ICD-10-CM | POA: Diagnosis not present

## 2023-04-15 DIAGNOSIS — K581 Irritable bowel syndrome with constipation: Secondary | ICD-10-CM | POA: Diagnosis not present

## 2023-04-15 DIAGNOSIS — I5032 Chronic diastolic (congestive) heart failure: Secondary | ICD-10-CM | POA: Diagnosis not present

## 2023-04-15 DIAGNOSIS — M4802 Spinal stenosis, cervical region: Secondary | ICD-10-CM | POA: Diagnosis not present

## 2023-04-15 DIAGNOSIS — G35 Multiple sclerosis: Secondary | ICD-10-CM

## 2023-04-15 DIAGNOSIS — K8689 Other specified diseases of pancreas: Secondary | ICD-10-CM | POA: Diagnosis not present

## 2023-04-15 DIAGNOSIS — N10 Acute tubulo-interstitial nephritis: Secondary | ICD-10-CM | POA: Diagnosis not present

## 2023-04-15 DIAGNOSIS — H900 Conductive hearing loss, bilateral: Secondary | ICD-10-CM | POA: Diagnosis not present

## 2023-04-15 DIAGNOSIS — R030 Elevated blood-pressure reading, without diagnosis of hypertension: Secondary | ICD-10-CM

## 2023-04-15 DIAGNOSIS — E871 Hypo-osmolality and hyponatremia: Secondary | ICD-10-CM | POA: Diagnosis not present

## 2023-04-15 DIAGNOSIS — G8220 Paraplegia, unspecified: Secondary | ICD-10-CM | POA: Diagnosis not present

## 2023-04-15 DIAGNOSIS — K219 Gastro-esophageal reflux disease without esophagitis: Secondary | ICD-10-CM | POA: Diagnosis not present

## 2023-04-15 DIAGNOSIS — D649 Anemia, unspecified: Secondary | ICD-10-CM | POA: Diagnosis not present

## 2023-04-15 DIAGNOSIS — T83511A Infection and inflammatory reaction due to indwelling urethral catheter, initial encounter: Secondary | ICD-10-CM | POA: Diagnosis not present

## 2023-04-15 DIAGNOSIS — E876 Hypokalemia: Secondary | ICD-10-CM | POA: Diagnosis not present

## 2023-04-15 DIAGNOSIS — I890 Lymphedema, not elsewhere classified: Secondary | ICD-10-CM | POA: Diagnosis not present

## 2023-04-15 DIAGNOSIS — Z9049 Acquired absence of other specified parts of digestive tract: Secondary | ICD-10-CM | POA: Diagnosis not present

## 2023-04-15 DIAGNOSIS — N319 Neuromuscular dysfunction of bladder, unspecified: Secondary | ICD-10-CM | POA: Diagnosis not present

## 2023-04-15 DIAGNOSIS — Z435 Encounter for attention to cystostomy: Secondary | ICD-10-CM | POA: Diagnosis not present

## 2023-04-17 NOTE — Telephone Encounter (Signed)
 Last seen on 08/15/22 per note "  Return in about 1 year (around 08/15/2023) for MyChart visit (15 min).  Follow up scheduled

## 2023-04-18 DIAGNOSIS — Z853 Personal history of malignant neoplasm of breast: Secondary | ICD-10-CM | POA: Diagnosis not present

## 2023-04-18 DIAGNOSIS — I890 Lymphedema, not elsewhere classified: Secondary | ICD-10-CM | POA: Diagnosis not present

## 2023-04-18 DIAGNOSIS — K581 Irritable bowel syndrome with constipation: Secondary | ICD-10-CM | POA: Diagnosis not present

## 2023-04-18 DIAGNOSIS — Z435 Encounter for attention to cystostomy: Secondary | ICD-10-CM | POA: Diagnosis not present

## 2023-04-18 DIAGNOSIS — G8220 Paraplegia, unspecified: Secondary | ICD-10-CM | POA: Diagnosis not present

## 2023-04-18 DIAGNOSIS — M4802 Spinal stenosis, cervical region: Secondary | ICD-10-CM | POA: Diagnosis not present

## 2023-04-18 DIAGNOSIS — H900 Conductive hearing loss, bilateral: Secondary | ICD-10-CM | POA: Diagnosis not present

## 2023-04-18 DIAGNOSIS — G894 Chronic pain syndrome: Secondary | ICD-10-CM | POA: Diagnosis not present

## 2023-04-18 DIAGNOSIS — N10 Acute tubulo-interstitial nephritis: Secondary | ICD-10-CM | POA: Diagnosis not present

## 2023-04-18 DIAGNOSIS — I5032 Chronic diastolic (congestive) heart failure: Secondary | ICD-10-CM | POA: Diagnosis not present

## 2023-04-18 DIAGNOSIS — R1310 Dysphagia, unspecified: Secondary | ICD-10-CM | POA: Diagnosis not present

## 2023-04-18 DIAGNOSIS — G35 Multiple sclerosis: Secondary | ICD-10-CM | POA: Diagnosis not present

## 2023-04-18 DIAGNOSIS — D649 Anemia, unspecified: Secondary | ICD-10-CM | POA: Diagnosis not present

## 2023-04-18 DIAGNOSIS — I081 Rheumatic disorders of both mitral and tricuspid valves: Secondary | ICD-10-CM | POA: Diagnosis not present

## 2023-04-18 DIAGNOSIS — K8689 Other specified diseases of pancreas: Secondary | ICD-10-CM | POA: Diagnosis not present

## 2023-04-18 DIAGNOSIS — E876 Hypokalemia: Secondary | ICD-10-CM | POA: Diagnosis not present

## 2023-04-18 DIAGNOSIS — K219 Gastro-esophageal reflux disease without esophagitis: Secondary | ICD-10-CM | POA: Diagnosis not present

## 2023-04-18 DIAGNOSIS — E871 Hypo-osmolality and hyponatremia: Secondary | ICD-10-CM | POA: Diagnosis not present

## 2023-04-18 DIAGNOSIS — N319 Neuromuscular dysfunction of bladder, unspecified: Secondary | ICD-10-CM | POA: Diagnosis not present

## 2023-04-18 DIAGNOSIS — T83511A Infection and inflammatory reaction due to indwelling urethral catheter, initial encounter: Secondary | ICD-10-CM | POA: Diagnosis not present

## 2023-04-18 DIAGNOSIS — Z9049 Acquired absence of other specified parts of digestive tract: Secondary | ICD-10-CM | POA: Diagnosis not present

## 2023-04-19 DIAGNOSIS — K219 Gastro-esophageal reflux disease without esophagitis: Secondary | ICD-10-CM | POA: Diagnosis not present

## 2023-04-19 DIAGNOSIS — E871 Hypo-osmolality and hyponatremia: Secondary | ICD-10-CM | POA: Diagnosis not present

## 2023-04-19 DIAGNOSIS — I081 Rheumatic disorders of both mitral and tricuspid valves: Secondary | ICD-10-CM | POA: Diagnosis not present

## 2023-04-19 DIAGNOSIS — I5032 Chronic diastolic (congestive) heart failure: Secondary | ICD-10-CM | POA: Diagnosis not present

## 2023-04-19 DIAGNOSIS — E876 Hypokalemia: Secondary | ICD-10-CM | POA: Diagnosis not present

## 2023-04-19 DIAGNOSIS — G8220 Paraplegia, unspecified: Secondary | ICD-10-CM | POA: Diagnosis not present

## 2023-04-19 DIAGNOSIS — N10 Acute tubulo-interstitial nephritis: Secondary | ICD-10-CM | POA: Diagnosis not present

## 2023-04-19 DIAGNOSIS — I890 Lymphedema, not elsewhere classified: Secondary | ICD-10-CM | POA: Diagnosis not present

## 2023-04-19 DIAGNOSIS — Z853 Personal history of malignant neoplasm of breast: Secondary | ICD-10-CM | POA: Diagnosis not present

## 2023-04-19 DIAGNOSIS — R1310 Dysphagia, unspecified: Secondary | ICD-10-CM | POA: Diagnosis not present

## 2023-04-19 DIAGNOSIS — N319 Neuromuscular dysfunction of bladder, unspecified: Secondary | ICD-10-CM | POA: Diagnosis not present

## 2023-04-19 DIAGNOSIS — Z435 Encounter for attention to cystostomy: Secondary | ICD-10-CM | POA: Diagnosis not present

## 2023-04-19 DIAGNOSIS — G35 Multiple sclerosis: Secondary | ICD-10-CM | POA: Diagnosis not present

## 2023-04-19 DIAGNOSIS — D649 Anemia, unspecified: Secondary | ICD-10-CM | POA: Diagnosis not present

## 2023-04-19 DIAGNOSIS — M4802 Spinal stenosis, cervical region: Secondary | ICD-10-CM | POA: Diagnosis not present

## 2023-04-19 DIAGNOSIS — T83511A Infection and inflammatory reaction due to indwelling urethral catheter, initial encounter: Secondary | ICD-10-CM | POA: Diagnosis not present

## 2023-04-19 DIAGNOSIS — K8689 Other specified diseases of pancreas: Secondary | ICD-10-CM | POA: Diagnosis not present

## 2023-04-19 DIAGNOSIS — H900 Conductive hearing loss, bilateral: Secondary | ICD-10-CM | POA: Diagnosis not present

## 2023-04-19 DIAGNOSIS — Z9049 Acquired absence of other specified parts of digestive tract: Secondary | ICD-10-CM | POA: Diagnosis not present

## 2023-04-19 DIAGNOSIS — K581 Irritable bowel syndrome with constipation: Secondary | ICD-10-CM | POA: Diagnosis not present

## 2023-04-19 DIAGNOSIS — G894 Chronic pain syndrome: Secondary | ICD-10-CM | POA: Diagnosis not present

## 2023-04-19 NOTE — Progress Notes (Deleted)
 04/21/2023 11:32 AM   Rebecca [name] [birth year] [id number]  Referring provider: Danelle Berry, PA-C [address] [place],  [place] [address]  Urological history: 1.  Neurogenic bladder -managed with an indwelling Foley -UDS on 04/20/2020 small capacity 60 cc neurogenic bladder with notable right-sided reflux with relatively low bladder pressures   2. Chronic bacteriuria  -secondary to chronic indwelling Foley    No chief complaint on file.  HPI: Rebecca [name] is a 63 y.o. female who presents today for hospital follow-up.    Previous records reviewed.   She was admitted in February for sepsis secondary to bilateral pyelonephritis.  She had recently had her indwelling Foley converted to an SPT on February 13 by IR and the next day she presented to the ED with fevers and chills.   During her hospitalization, she developed gross hematuria which self resolved.      PMH: Past Medical History:  Diagnosis Date   Allergy    Aortic valve disease    Mild AS / AI - most recent Echo demonstrated tricuspid aortic valve.   Bacterial endocarditis    History of .   Bilateral impacted cerumen 06/29/2021   Bilateral lower extremity edema    Noncardiac.  Chronic. LE Venous dopplers - negative for DVT.; Echocardiogram January 2016: Normal EF with normal wall motion and valve function. Only grade 1 diastolic dysfunction. EF 60-65%. Mild MR   Breast cancer (HCC) 12/31/2013   Right breast, 12:00, 1.5 cm, T1c,N0 invasive mammary carcinoma, triple negative. --> Rx with Chemo   Cervical stenosis of spine    GERD (gastroesophageal reflux disease) April or May 2022   Heart murmur 2013   Herpes zoster    IBS (irritable bowel syndrome)    Lymphedema    has legs wrapped at [facility]   Multiple sclerosis (HCC) 2001   Walks from room to room @ home; but Wheelchair when going out.   Neuromuscular disorder (HCC)    MS   PONV (postoperative nausea and vomiting)    Related to Fentanyl    Seizures (HCC)    Takes Keppra   SOB (shortness of breath) 03/01/2013   Syncope and collapse    Urinary tract infection associated with indwelling urethral catheter (HCC) 02/17/2020    Surgical History: Past Surgical History:  Procedure Laterality Date   ANKLE SURGERY     Left   ANKLE SURGERY     ANTERIOR CERVICAL DECOMP/DISCECTOMY FUSION  11/17/2011   Procedure: ANTERIOR CERVICAL DECOMPRESSION/DISCECTOMY FUSION 2 LEVELS;  Surgeon: Maeola Harman, MD;  [facility]: [facility];  Service: Neurosurgery;  Laterality: N/A;  Cervical Five-Six Six-Seven Anterior cervical decompression/diskectomy/fusion   BREAST BIOPSY Right 12/31/2013   invasive mammary   BREAST SURGERY Right 02/03/2014   Right simple mastectomy with sentinel node biopsy.   CHOLECYSTECTOMY     COLONOSCOPY  2014   ESOPHAGOGASTRODUODENOSCOPY (EGD) WITH PROPOFOL N/A 07/16/2020   Procedure: ESOPHAGOGASTRODUODENOSCOPY (EGD) WITH PROPOFOL;  Surgeon: Pasty Spillers, MD;  Location: [facility] ENDOSCOPY;  Service: Endoscopy;  Laterality: N/A;  Patient has MS and will need assistance   HYSTEROSCOPY WITH D & C N/A 11/27/2018   Procedure: DILATATION AND CURETTAGE /HYSTEROSCOPY;  Surgeon: Nadara Mustard, MD;  Location: [facility] ORS;  Service: Gynecology;  Laterality: N/A;   IR CYSTOSTOMY TUBE PLACEMENT/BLADDER ASPIRATION  03/23/2023   Lower extremity venous Dopplers  02/27/2013   No LE DVT   MASTECTOMY Right 2015   ORIF TIBIA PLATEAU Left 12/09/2020   Procedure:  LEFT OPEN REDUCTION INTERNAL FIXATION (ORIF) TIBIAL PLATEAU;  Surgeon: Roby Lofts, MD;  Location: [facility];  Service: Orthopedics;  Laterality: Left;   Port a cath insertion Right 01/19/2010   PORT-A-CATH REMOVAL     right   PORT-A-CATH REMOVAL Right 09/03/2013   Procedure: REMOVAL PORT-A-CATH;  Surgeon: Fransisco Hertz, MD;  Location: [facility] OR;  Service: Vascular;  Laterality: Right;   SPINE SURGERY  Cervical steinois Dr. Venetia Maxon 2014 I think?   TONSILLECTOMY     TRANSTHORACIC  ECHOCARDIOGRAM  03/2013; 02/2014   a) Normal LV size and function with EF 60-65%.; Cannot exclude bicuspid aortic valve with mild AS and mild AI.; b) Normal EF with normal wall motion and valve function x Mild MR. G2 DD. EF 60-65%. Tricuspid AoV   UPPER GI ENDOSCOPY  2014    Home Medications:  Allergies as of 04/21/2023       Reactions   Fentanyl Nausea And Vomiting, Nausea Only   vomiting Was given in PACU x3 each time patient got sick   Sulfa Antibiotics Hives, Other (See Comments)   Light headed, over heated        Medication List        Accurate as of April 19, 2023 11:32 AM. If you have any questions, ask your nurse or doctor.          amantadine 100 MG capsule Commonly known as: SYMMETREL TAKE 1 CAPSULE BY MOUTH THREE TIMES A DAY   baclofen 10 MG tablet Commonly known as: LIORESAL Take 1-2 tablets (10-20 mg total) by mouth 3 (three) times daily.   COSAMIN DS PO Take 1 tablet by mouth 3 (three) times daily.   gluconic acid-citric acid irrigation Insert 30 mL into suprapubic tube and clamp tube for 10 minutes and then drain twice daily   Leg Vein & Circulation Tabs Take 1 tablet by mouth 2 (two) times daily.   levETIRAcetam 500 MG tablet Commonly known as: KEPPRA Take 1 tablet (500 mg total) by mouth 2 (two) times daily.   lipase/protease/amylase 16109 UNITS Cpep capsule Commonly known as: Creon Take 2 capsules (72,000 Units total) by mouth 3 (three) times daily with meals AND 1 capsule (36,000 Units total) with snacks. What changed: See the new instructions.   loratadine 10 MG tablet Commonly known as: CLARITIN Take 10 mg by mouth daily.   MULTIVITAMIN PO Take 1 tablet by mouth daily.   nystatin powder Commonly known as: MYCOSTATIN/NYSTOP Apply 1 application topically 3 (three) times daily as needed. For rash/raw skin as needed   omeprazole 40 MG capsule Commonly known as: PRILOSEC TAKE 1 CAPSULE (40 MG TOTAL) BY MOUTH DAILY.   oxybutynin 10 MG  24 hr tablet Commonly known as: DITROPAN-XL Take 1 tablet (10 mg total) by mouth 2 (two) times daily.   Rebif Rebidose 44 MCG/0.5ML Soaj Generic drug: Interferon Beta-1a INJECT 1 PEN UNDER THE SKIN 3 TIMES PER WEEK   tiZANidine 4 MG tablet Commonly known as: ZANAFLEX Take 1 tablet (4 mg total) by mouth at bedtime.   Turmeric 500 MG Caps Take 500 mg by mouth daily.        Allergies:  Allergies  Allergen Reactions   Fentanyl Nausea And Vomiting and Nausea Only    vomiting Was given in PACU x3 each time patient got sick    Sulfa Antibiotics Hives and Other (See Comments)    Light headed, over heated    Family History: Family History  Problem Relation Age of  Onset   Cancer Father        skin   Heart disease Father    Heart attack Father        heart attack in his 15's   Thyroid disease Sister    Ovarian cancer Cousin    Breast cancer Maternal Aunt 93   Breast cancer Maternal Grandmother 19   Bladder Cancer Neg Hx    Kidney cancer Neg Hx     Social History:  reports that she has never smoked. She has never used smokeless tobacco. She reports that she does not drink alcohol and does not use drugs.  ROS: Pertinent ROS in HPI  Physical Exam: There were no vitals taken for this visit.  Constitutional:  Well nourished. Alert and oriented, No acute distress. HEENT: [place] AT, moist mucus membranes.  Trachea midline, no masses. Cardiovascular: No clubbing, cyanosis, or edema. Respiratory: Normal respiratory effort, no increased work of breathing. GU: No CVA tenderness.  No bladder fullness or masses.  Recession of labia minora, dry, pale vulvar vaginal mucosa and loss of mucosal ridges and folds.  Normal urethral meatus, no lesions, no prolapse, no discharge.   No urethral masses, tenderness and/or tenderness. No bladder fullness, tenderness or masses. *** vagina mucosa, *** estrogen effect, no discharge, no lesions, *** pelvic support, *** cystocele and *** rectocele noted.   No cervical motion tenderness.  Uterus is freely mobile and non-fixed.  No adnexal/parametria masses or tenderness noted.  Anus and perineum are without rashes or lesions.   ***  Neurologic: Grossly intact, no focal deficits, moving all 4 extremities. Psychiatric: Normal mood and affect.    Laboratory Data: Lab Results  Component Value Date   WBC 2.8 (L) 03/31/2023   HGB 9.3 (L) 03/31/2023   HCT 27.6 (L) 03/31/2023   MCV 87.1 03/31/2023   PLT 184 03/31/2023   Lab Results  Component Value Date   CREATININE 0.54 03/26/2023   Lab Results  Component Value Date   HGBA1C 5.8 (H) 12/07/2022   Lab Results  Component Value Date   TSH 3.00 12/07/2022      Component Value Date/Time   CHOL 199 12/07/2022 1117   HDL 55 12/07/2022 1117   CHOLHDL 3.6 12/07/2022 1117   VLDL 13 12/11/2020 1505   LDLCALC 119 (H) 12/07/2022 1117   Lab Results  Component Value Date   AST 30 03/24/2023   Lab Results  Component Value Date   ALT 32 03/24/2023   Urinalysis See EPIC and HPI  I have reviewed the labs.   Pertinent Imaging: N/A  Assessment & Plan:  ***  1.  Neurogenic bladder -Will coordinate with IR for placement of her next SPT -UA *** -urine sent for culture -  2. Sepsis *** No follow-ups on file.  These notes generated with voice recognition software. I apologize for typographical errors.  Cloretta Ned  [facility] [address]  [address] [place], [place] [address] [phone]

## 2023-04-21 ENCOUNTER — Ambulatory Visit: Payer: 59 | Admitting: Urology

## 2023-04-21 DIAGNOSIS — B9620 Unspecified Escherichia coli [E. coli] as the cause of diseases classified elsewhere: Secondary | ICD-10-CM

## 2023-04-21 DIAGNOSIS — N319 Neuromuscular dysfunction of bladder, unspecified: Secondary | ICD-10-CM

## 2023-04-22 DIAGNOSIS — G35 Multiple sclerosis: Secondary | ICD-10-CM | POA: Diagnosis not present

## 2023-04-22 DIAGNOSIS — E876 Hypokalemia: Secondary | ICD-10-CM | POA: Diagnosis not present

## 2023-04-22 DIAGNOSIS — K219 Gastro-esophageal reflux disease without esophagitis: Secondary | ICD-10-CM | POA: Diagnosis not present

## 2023-04-22 DIAGNOSIS — G894 Chronic pain syndrome: Secondary | ICD-10-CM | POA: Diagnosis not present

## 2023-04-22 DIAGNOSIS — N10 Acute tubulo-interstitial nephritis: Secondary | ICD-10-CM | POA: Diagnosis not present

## 2023-04-22 DIAGNOSIS — K8689 Other specified diseases of pancreas: Secondary | ICD-10-CM | POA: Diagnosis not present

## 2023-04-22 DIAGNOSIS — N319 Neuromuscular dysfunction of bladder, unspecified: Secondary | ICD-10-CM | POA: Diagnosis not present

## 2023-04-22 DIAGNOSIS — T83511A Infection and inflammatory reaction due to indwelling urethral catheter, initial encounter: Secondary | ICD-10-CM | POA: Diagnosis not present

## 2023-04-22 DIAGNOSIS — I5032 Chronic diastolic (congestive) heart failure: Secondary | ICD-10-CM | POA: Diagnosis not present

## 2023-04-22 DIAGNOSIS — R1310 Dysphagia, unspecified: Secondary | ICD-10-CM | POA: Diagnosis not present

## 2023-04-22 DIAGNOSIS — G8220 Paraplegia, unspecified: Secondary | ICD-10-CM | POA: Diagnosis not present

## 2023-04-22 DIAGNOSIS — I081 Rheumatic disorders of both mitral and tricuspid valves: Secondary | ICD-10-CM | POA: Diagnosis not present

## 2023-04-22 DIAGNOSIS — Z435 Encounter for attention to cystostomy: Secondary | ICD-10-CM | POA: Diagnosis not present

## 2023-04-22 DIAGNOSIS — E871 Hypo-osmolality and hyponatremia: Secondary | ICD-10-CM | POA: Diagnosis not present

## 2023-04-22 DIAGNOSIS — I890 Lymphedema, not elsewhere classified: Secondary | ICD-10-CM | POA: Diagnosis not present

## 2023-04-22 DIAGNOSIS — K581 Irritable bowel syndrome with constipation: Secondary | ICD-10-CM | POA: Diagnosis not present

## 2023-04-22 DIAGNOSIS — D649 Anemia, unspecified: Secondary | ICD-10-CM | POA: Diagnosis not present

## 2023-04-22 DIAGNOSIS — Z9049 Acquired absence of other specified parts of digestive tract: Secondary | ICD-10-CM | POA: Diagnosis not present

## 2023-04-22 DIAGNOSIS — Z853 Personal history of malignant neoplasm of breast: Secondary | ICD-10-CM | POA: Diagnosis not present

## 2023-04-22 DIAGNOSIS — H900 Conductive hearing loss, bilateral: Secondary | ICD-10-CM | POA: Diagnosis not present

## 2023-04-22 DIAGNOSIS — M4802 Spinal stenosis, cervical region: Secondary | ICD-10-CM | POA: Diagnosis not present

## 2023-04-24 ENCOUNTER — Telehealth: Payer: Self-pay | Admitting: Diagnostic Neuroimaging

## 2023-04-24 DIAGNOSIS — I081 Rheumatic disorders of both mitral and tricuspid valves: Secondary | ICD-10-CM | POA: Diagnosis not present

## 2023-04-24 DIAGNOSIS — I890 Lymphedema, not elsewhere classified: Secondary | ICD-10-CM | POA: Diagnosis not present

## 2023-04-24 DIAGNOSIS — K8689 Other specified diseases of pancreas: Secondary | ICD-10-CM | POA: Diagnosis not present

## 2023-04-24 DIAGNOSIS — I5032 Chronic diastolic (congestive) heart failure: Secondary | ICD-10-CM | POA: Diagnosis not present

## 2023-04-24 DIAGNOSIS — Z9049 Acquired absence of other specified parts of digestive tract: Secondary | ICD-10-CM | POA: Diagnosis not present

## 2023-04-24 DIAGNOSIS — K581 Irritable bowel syndrome with constipation: Secondary | ICD-10-CM | POA: Diagnosis not present

## 2023-04-24 DIAGNOSIS — T83511A Infection and inflammatory reaction due to indwelling urethral catheter, initial encounter: Secondary | ICD-10-CM | POA: Diagnosis not present

## 2023-04-24 DIAGNOSIS — N10 Acute tubulo-interstitial nephritis: Secondary | ICD-10-CM | POA: Diagnosis not present

## 2023-04-24 DIAGNOSIS — K219 Gastro-esophageal reflux disease without esophagitis: Secondary | ICD-10-CM | POA: Diagnosis not present

## 2023-04-24 DIAGNOSIS — Z435 Encounter for attention to cystostomy: Secondary | ICD-10-CM | POA: Diagnosis not present

## 2023-04-24 DIAGNOSIS — G8220 Paraplegia, unspecified: Secondary | ICD-10-CM | POA: Diagnosis not present

## 2023-04-24 DIAGNOSIS — E871 Hypo-osmolality and hyponatremia: Secondary | ICD-10-CM | POA: Diagnosis not present

## 2023-04-24 DIAGNOSIS — E876 Hypokalemia: Secondary | ICD-10-CM | POA: Diagnosis not present

## 2023-04-24 DIAGNOSIS — R1310 Dysphagia, unspecified: Secondary | ICD-10-CM | POA: Diagnosis not present

## 2023-04-24 DIAGNOSIS — Z853 Personal history of malignant neoplasm of breast: Secondary | ICD-10-CM | POA: Diagnosis not present

## 2023-04-24 DIAGNOSIS — D649 Anemia, unspecified: Secondary | ICD-10-CM | POA: Diagnosis not present

## 2023-04-24 DIAGNOSIS — G35 Multiple sclerosis: Secondary | ICD-10-CM | POA: Diagnosis not present

## 2023-04-24 DIAGNOSIS — G894 Chronic pain syndrome: Secondary | ICD-10-CM | POA: Diagnosis not present

## 2023-04-24 DIAGNOSIS — M4802 Spinal stenosis, cervical region: Secondary | ICD-10-CM | POA: Diagnosis not present

## 2023-04-24 DIAGNOSIS — N319 Neuromuscular dysfunction of bladder, unspecified: Secondary | ICD-10-CM | POA: Diagnosis not present

## 2023-04-24 DIAGNOSIS — H900 Conductive hearing loss, bilateral: Secondary | ICD-10-CM | POA: Diagnosis not present

## 2023-04-24 MED ORDER — REBIF REBIDOSE 44 MCG/0.5ML ~~LOC~~ SOAJ: 0.5000 mL | SUBCUTANEOUS | 0 refills | Status: DC

## 2023-04-24 NOTE — Telephone Encounter (Signed)
 Refill sent for 3 mth supply to [facility] specialty pharmacy. Pt needs to schedule upcoming apt for future refills. If stable can be with NP

## 2023-04-24 NOTE — Telephone Encounter (Signed)
 Pt is requesting a refill for REBIF REBIDOSE 44 MCG/0.5ML SOAJ.  Pharmacy: [facility]

## 2023-04-25 NOTE — Progress Notes (Signed)
 04/27/2023 6:51 PM   Rebecca [name] [birth year] [id number]  Referring provider: Danelle Berry, PA-C [address] [place],  [place] [address]  Urological history: 1.  Neurogenic bladder -managed with an indwelling Foley -UDS on 04/20/2020 small capacity 60 cc neurogenic bladder with notable right-sided reflux with relatively low bladder pressures   2. Chronic bacteriuria  -secondary to chronic indwelling Foley    Chief Complaint  Patient presents with   Follow-up   HPI: Rebecca [name] is a 63 y.o. female who presents today for hospital follow-up.    Previous records reviewed.   She was admitted in February for sepsis secondary to bilateral pyelonephritis.  She had recently had her indwelling Foley converted to an SPT on February 13 by IR and the next day she presented to the ED with fevers and chills.   During her hospitalization, she developed gross hematuria which self resolved.  She presents today for hospital follow up.  She has been well urologically speaking since discharge.  She has not had any further hematuria.  She is dealing with a URI at this time.  Patient denies any modifying or aggravating factors.  Patient denies any recent UTI's, gross hematuria, dysuria or suprapubic/flank pain.  Patient denies any fevers, chills, nausea or vomiting.    UA yellow cloudy, specific gravity greater than 1.030, 3+ heme, pH 5.5, 3+ protein, 2.0 urobilinogen, 1+ leukocyte, greater than 30 WBCs, 11-30 RBCs, 0-10 epithelial cells and moderate bacteria.  PMH: Past Medical History:  Diagnosis Date   Allergy    Aortic valve disease    Mild AS / AI - most recent Echo demonstrated tricuspid aortic valve.   Bacterial endocarditis    History of .   Bilateral impacted cerumen 06/29/2021   Bilateral lower extremity edema    Noncardiac.  Chronic. LE Venous dopplers - negative for DVT.; Echocardiogram January 2016: Normal EF with normal wall motion and valve function. Only grade  1 diastolic dysfunction. EF 60-65%. Mild MR   Breast cancer (HCC) 12/31/2013   Right breast, 12:00, 1.5 cm, T1c,N0 invasive mammary carcinoma, triple negative. --> Rx with Chemo   Cervical stenosis of spine    GERD (gastroesophageal reflux disease) April or May 2022   Heart murmur 2013   Herpes zoster    IBS (irritable bowel syndrome)    Lymphedema    has legs wrapped at [facility]   Multiple sclerosis (HCC) 2001   Walks from room to room @ home; but Wheelchair when going out.   Neuromuscular disorder (HCC)    MS   PONV (postoperative nausea and vomiting)    Related to Fentanyl   Seizures (HCC)    Takes Keppra   SOB (shortness of breath) 03/01/2013   Syncope and collapse    Urinary tract infection associated with indwelling urethral catheter (HCC) 02/17/2020    Surgical History: Past Surgical History:  Procedure Laterality Date   ANKLE SURGERY     Left   ANKLE SURGERY     ANTERIOR CERVICAL DECOMP/DISCECTOMY FUSION  11/17/2011   Procedure: ANTERIOR CERVICAL DECOMPRESSION/DISCECTOMY FUSION 2 LEVELS;  Surgeon: Maeola Harman, MD;  Location: [facility];  Service: Neurosurgery;  Laterality: N/A;  Cervical Five-Six Six-Seven Anterior cervical decompression/diskectomy/fusion   BREAST BIOPSY Right 12/31/2013   invasive mammary   BREAST SURGERY Right 02/03/2014   Right simple mastectomy with sentinel node biopsy.   CHOLECYSTECTOMY     COLONOSCOPY  2014   ESOPHAGOGASTRODUODENOSCOPY (EGD) WITH PROPOFOL N/A 07/16/2020  Procedure: ESOPHAGOGASTRODUODENOSCOPY (EGD) WITH PROPOFOL;  Surgeon: Pasty Spillers, MD;  Location: [facility] END[facility];  Service: Endoscopy;  Laterality: N/A;  Patient has MS and will need assistance   HYSTEROSCOPY WITH D & C N/A 11/27/2018   Procedure: DILATATION AND CURETTAGE /HYSTEROSCOPY;  Surgeon: Nadara Mustard, MD;  Location: [facility] ORS;  Service: Gynecology;  Laterality: N/A;   IR CYSTOSTOMY TUBE PLACEMENT/BLADDER ASPIRATION  03/23/2023   Lower extremity venous  Dopplers  02/27/2013   No LE DVT   MASTECTOMY Right 2015   ORIF TIBIA PLATEAU Left 12/09/2020   Procedure: LEFT OPEN REDUCTION INTERNAL FIXATION (ORIF) TIBIAL PLATEAU;  Surgeon: Roby Lofts, MD;  Location: [facility] OR;  Service: Orthopedics;  Laterality: Left;   Port a cath insertion Right 01/19/2010   PORT-A-CATH REMOVAL     right   PORT-A-CATH REMOVAL Right 09/03/2013   Procedure: REMOVAL PORT-A-CATH;  Surgeon: Fransisco Hertz, MD;  Location: [facility] OR;  Service: Vascular;  Laterality: Right;   SPINE SURGERY  Cervical steinois Dr. Venetia Maxon 2014 I think?   TONSILLECTOMY     TRANSTHORACIC ECHOCARDIOGRAM  03/2013; 02/2014   a) Normal LV size and function with EF 60-65%.; Cannot exclude bicuspid aortic valve with mild AS and mild AI.; b) Normal EF with normal wall motion and valve function x Mild MR. G2 DD. EF 60-65%. Tricuspid AoV   UPPER GI ENDOSCOPY  2014    Home Medications:  Allergies as of 04/27/2023       Reactions   Fentanyl Nausea And Vomiting, Nausea Only   vomiting Was given in PACU x3 each time patient got sick   Sulfa Antibiotics Hives, Other (See Comments)   Light headed, over heated        Medication List        Accurate as of April 27, 2023 11:59 PM. If you have any questions, ask your nurse or doctor.          amantadine 100 MG capsule Commonly known as: SYMMETREL TAKE 1 CAPSULE BY MOUTH THREE TIMES A DAY   baclofen 10 MG tablet Commonly known as: LIORESAL Take 1-2 tablets (10-20 mg total) by mouth 3 (three) times daily.   COSAMIN DS PO Take 1 tablet by mouth 3 (three) times daily.   gluconic acid-citric acid irrigation Insert 30 mL into suprapubic tube and clamp tube for 10 minutes and then drain twice daily   Leg Vein & Circulation Tabs Take 1 tablet by mouth 2 (two) times daily.   levETIRAcetam 500 MG tablet Commonly known as: KEPPRA Take 1 tablet (500 mg total) by mouth 2 (two) times daily.   lipase/protease/amylase 16109 UNITS Cpep capsule Commonly  known as: Creon Take 2 capsules (72,000 Units total) by mouth 3 (three) times daily with meals AND 1 capsule (36,000 Units total) with snacks. What changed: See the new instructions.   loratadine 10 MG tablet Commonly known as: CLARITIN Take 10 mg by mouth daily.   MULTIVITAMIN PO Take 1 tablet by mouth daily.   nystatin powder Commonly known as: MYCOSTATIN/NYSTOP Apply 1 application topically 3 (three) times daily as needed. For rash/raw skin as needed   omeprazole 40 MG capsule Commonly known as: PRILOSEC TAKE 1 CAPSULE (40 MG TOTAL) BY MOUTH DAILY.   oxybutynin 10 MG 24 hr tablet Commonly known as: DITROPAN-XL Take 1 tablet (10 mg total) by mouth 2 (two) times daily.   Rebif Rebidose 44 MCG/0.5ML Soaj Generic drug: Interferon Beta-1a Inject 0.5 mLs into the skin 3 (three) times  a week.   tiZANidine 4 MG tablet Commonly known as: ZANAFLEX Take 1 tablet (4 mg total) by mouth at bedtime.   Turmeric 500 MG Caps Take 500 mg by mouth daily.        Allergies:  Allergies  Allergen Reactions   Fentanyl Nausea And Vomiting and Nausea Only    vomiting Was given in PACU x3 each time patient got sick    Sulfa Antibiotics Hives and Other (See Comments)    Light headed, over heated    Family History: Family History  Problem Relation Age of Onset   Cancer Father        skin   Heart disease Father    Heart attack Father        heart attack in his 45's   Thyroid disease Sister    Ovarian cancer Cousin    Breast cancer Maternal Aunt 70   Breast cancer Maternal Grandmother 54   Bladder Cancer Neg Hx    Kidney cancer Neg Hx     Social History:  reports that she has never smoked. She has never used smokeless tobacco. She reports that she does not drink alcohol and does not use drugs.  ROS: Pertinent ROS in HPI  Physical Exam: BP (!) 140/83   Pulse 94   Ht 5\' 4"  (1.626 m)   Wt 223 lb (101.2 kg)   BMI 38.28 kg/m   Constitutional:  Well nourished. Alert and  oriented, No acute distress. HEENT: [place] AT, moist mucus membranes.  Trachea midline Cardiovascular: No clubbing, cyanosis, or edema. Respiratory: Normal respiratory effort, no increased work of breathing. Neurologic: Grossly intact, no focal deficits, moving all 4 extremities. Psychiatric: Normal mood and affect.    Laboratory Data: Lab Results  Component Value Date   WBC 2.8 (L) 03/31/2023   HGB 9.3 (L) 03/31/2023   HCT 27.6 (L) 03/31/2023   MCV 87.1 03/31/2023   PLT 184 03/31/2023   Lab Results  Component Value Date   CREATININE 0.54 03/26/2023   Lab Results  Component Value Date   HGBA1C 5.8 (H) 12/07/2022   Lab Results  Component Value Date   TSH 3.00 12/07/2022      Component Value Date/Time   CHOL 199 12/07/2022 1117   HDL 55 12/07/2022 1117   CHOLHDL 3.6 12/07/2022 1117   VLDL 13 12/11/2020 1505   LDLCALC 119 (H) 12/07/2022 1117   Lab Results  Component Value Date   AST 30 03/24/2023   Lab Results  Component Value Date   ALT 32 03/24/2023   Urinalysis See EPIC and HPI  I have reviewed the labs.   Pertinent Imaging: N/A  Assessment & Plan:    1.  Neurogenic bladder -Will coordinate with IR for placement of her next SPT -UA w/ pyuria, hematuria and bacteriuria  -urine sent for culture -She is not having symptoms at this time and because of her history of a chronic Foley she is likely colonized, but since she will be scheduling instrumentation in the future with the placement of the SPT, we will go ahead and treat if urine culture is positive.  2. Sepsis -resolved   Return for IR to contact her reguarding next SPT upsizing .  These notes generated with voice recognition software. I apologize for typographical errors.  Cloretta Ned  [facility] [address]  [address] [place], [place] [address] [phone]

## 2023-04-26 ENCOUNTER — Telehealth: Payer: Self-pay

## 2023-04-26 ENCOUNTER — Other Ambulatory Visit (HOSPITAL_COMMUNITY): Payer: Self-pay

## 2023-04-26 NOTE — Telephone Encounter (Addendum)
 [employer]) Requesting prior authorization submitted through insurance for patient to be able to get medication. Do not need a call back but if you have questions can contact at [phone]

## 2023-04-26 NOTE — Telephone Encounter (Signed)
 Called patient and she will upload front and back of insurance card via my chart.

## 2023-04-26 NOTE — Telephone Encounter (Signed)
 I was asked to do a PA for Rebif-can not pull PT up in CMM-we do not have an updated insurance card loaded into the system-can we please have the PT load updated insurance card- I need to see how her name is listed on her card as well as verify Date of Birth. Thanks so much!

## 2023-04-27 ENCOUNTER — Other Ambulatory Visit (HOSPITAL_COMMUNITY): Payer: Self-pay

## 2023-04-27 ENCOUNTER — Ambulatory Visit: Admitting: Urology

## 2023-04-27 ENCOUNTER — Encounter: Payer: Self-pay | Admitting: Urology

## 2023-04-27 VITALS — BP 140/83 | HR 94 | Ht 64.0 in | Wt 223.0 lb

## 2023-04-27 DIAGNOSIS — N319 Neuromuscular dysfunction of bladder, unspecified: Secondary | ICD-10-CM | POA: Diagnosis not present

## 2023-04-27 DIAGNOSIS — B9620 Unspecified Escherichia coli [E. coli] as the cause of diseases classified elsewhere: Secondary | ICD-10-CM | POA: Diagnosis not present

## 2023-04-27 DIAGNOSIS — Z7401 Bed confinement status: Secondary | ICD-10-CM | POA: Diagnosis not present

## 2023-04-27 DIAGNOSIS — Z743 Need for continuous supervision: Secondary | ICD-10-CM | POA: Diagnosis not present

## 2023-04-27 DIAGNOSIS — G35 Multiple sclerosis: Secondary | ICD-10-CM | POA: Diagnosis not present

## 2023-04-27 DIAGNOSIS — R69 Illness, unspecified: Secondary | ICD-10-CM | POA: Diagnosis not present

## 2023-04-27 DIAGNOSIS — N390 Urinary tract infection, site not specified: Secondary | ICD-10-CM | POA: Diagnosis not present

## 2023-04-27 LAB — URINALYSIS, COMPLETE
Bilirubin, UA: NEGATIVE
Glucose, UA: NEGATIVE
Ketones, UA: NEGATIVE
Nitrite, UA: NEGATIVE
Specific Gravity, UA: >1.03 — ABNORMAL HIGH (ref 1.005–1.030)
Urobilinogen, Ur: 2 mg/dL — ABNORMAL HIGH (ref 0.2–1.0)
pH, UA: 5.5 (ref 5.0–7.5)

## 2023-04-27 LAB — MICROSCOPIC EXAMINATION: WBC, UA: >30 /HPF — AB (ref 0–5)

## 2023-04-27 NOTE — Telephone Encounter (Signed)
 Pharmacy Patient Advocate Encounter   Received notification from Physician's Office that prior authorization for Rebif Rebidose 44MCG/0.5ML auto-injectors is required/requested.   Insurance verification completed.   The patient is insured through [employer] .   Per test claim: PA required; PA submitted to above mentioned insurance via CoverMyMeds Key/confirmation #/EOC [id number] Status is pending   PT Name must be entered as below      And leave the last two zero's off when entering into CMM in order for them to find the PT.

## 2023-04-27 NOTE — Telephone Encounter (Signed)
 Pt insurance card has been uploaded in chart.

## 2023-04-27 NOTE — Telephone Encounter (Signed)
 [employer]) Have faxed prior authorization to your office for provider to  filled out and fax.

## 2023-04-28 ENCOUNTER — Other Ambulatory Visit: Payer: Self-pay

## 2023-04-28 ENCOUNTER — Emergency Department
Admission: EM | Admit: 2023-04-28 | Discharge: 2023-04-29 | Disposition: A | Discharge status: Home / Self Care | Attending: Emergency Medicine | Admitting: Emergency Medicine

## 2023-04-28 DIAGNOSIS — Y829 Unspecified medical devices associated with adverse incidents: Secondary | ICD-10-CM | POA: Insufficient documentation

## 2023-04-28 DIAGNOSIS — T83091A Other mechanical complication of indwelling urethral catheter, initial encounter: Secondary | ICD-10-CM | POA: Diagnosis not present

## 2023-04-28 DIAGNOSIS — T83028A Displacement of other indwelling urethral catheter, initial encounter: Secondary | ICD-10-CM | POA: Insufficient documentation

## 2023-04-28 DIAGNOSIS — R262 Difficulty in walking, not elsewhere classified: Secondary | ICD-10-CM | POA: Diagnosis not present

## 2023-04-28 DIAGNOSIS — T83010A Breakdown (mechanical) of cystostomy catheter, initial encounter: Secondary | ICD-10-CM

## 2023-04-28 DIAGNOSIS — R6889 Other general symptoms and signs: Secondary | ICD-10-CM | POA: Diagnosis not present

## 2023-04-28 DIAGNOSIS — R0689 Other abnormalities of breathing: Secondary | ICD-10-CM | POA: Diagnosis not present

## 2023-04-28 MED ORDER — LIDOCAINE HCL URETHRAL/MUCOSAL 2 % EX GEL
1.0000 | Freq: Once | CUTANEOUS | Status: AC
  Administered 2023-04-28: 1 via TOPICAL
  Filled 2023-04-28: qty 10

## 2023-04-28 MED ORDER — CEFUROXIME AXETIL 500 MG PO TABS: 500.0000 mg | ORAL_TABLET | Freq: Two times a day (BID) | ORAL | 0 refills | Status: DC

## 2023-04-28 NOTE — ED Notes (Signed)
 Patient (bed bound) in hallway of triage via EMS.  Per EMS patient from home, noticed at 6:30 pm that her supra pubic cath was no longer in place.  BP - 140/100 T - 97.9 97% on ra P - 94

## 2023-04-28 NOTE — ED Triage Notes (Signed)
 Pt came to ER via EMS from home, with chief complain of supra pubic catheter tube problems. Pt reports she noticed about an hr ago tube off. Pt denies any other complaints at present.

## 2023-04-28 NOTE — ED Provider Notes (Signed)
 [facility] Provider Note    Event Date/Time   First MD Initiated Contact with Patient 04/28/23 1948     (approximate)   History   Supra pubic tube dislodged    HPI  Rebecca [name] is a 63 year old female with history of neurogenic bladder s/p suprapubic tube placement in February presenting to the emergency department for evaluation of dislodged suprapubic catheter.  Catheter was placed on 03/23/2023 by IR.  Reports this was planned to be upsized, but has a pigtail catheter in place.  Today around 6:30 PM, noticed that her catheter had been become dislodged.  No significant abdominal pain or discomfort.  Reports that she used to have an indwelling Foley catheter, but was changed to suprapubic catheter due to recurrent infections.  Follows with urology.      Physical Exam   Triage Vital Signs: ED Triage Vitals [04/28/23 1934]  Encounter Vitals Group     BP (!) 155/79     Systolic BP Percentile      Diastolic BP Percentile      Pulse Rate 75     Resp 16     Temp (!) 97.5 F (36.4 C)     Temp Source Oral     SpO2 99 %     Weight 223 lb (101.2 kg)     Height 5\' 4"  (1.626 m)     Head Circumference      Peak Flow      Pain Score 0     Pain Loc      Pain Education      Exclude from Growth Chart     Most recent vital signs: Vitals:   04/28/23 2200 04/28/23 2245  BP: (!) 149/65   Pulse: (!) 104 96  Resp:    Temp:    SpO2: 100% 97%     General: Awake, interactive  CV:  Regular rate, good peripheral perfusion.  Resp:  Unlabored respirations.  Abd:  Nondistended, soft, no significant tenderness to palpation, tract noted consistent location of prior suprapubic catheter.  Patient has her prior catheter at bedside, 16 [place], pigtail catheter Neuro:  Symmetric facial movement, fluid speech   ED Results / Procedures / Treatments   Labs (all labs ordered are listed, but only abnormal results are displayed) Labs Reviewed - No data to  display   EKG EKG independently reviewed interpreted by myself (ER attending) demonstrates:    RADIOLOGY Imaging independently reviewed and interpreted by myself demonstrates:    PROCEDURES:  Critical Care performed: No  SUPRAPUBIC TUBE PLACEMENT  Date/Time: 04/28/2023 11:15 PM  Performed by: Trinna Post, MD Authorized by: Trinna Post, MD   Consent:    Consent obtained:  Verbal   Consent given by:  Patient   Risks, benefits, and alternatives were discussed: yes   Anesthesia:    Anesthesia method:  Topical application   Topical anesthetic:  Lidocaine gel Procedure details:    Complexity:  Complex   Catheter type:  Foley   Catheter size:  14 Fr   Number of attempts:  3   Urine characteristics:  Blood-tinged Post-procedure details:    Procedure completion:  Tolerated    MEDICATIONS ORDERED IN ED: Medications  lidocaine (XYLOCAINE) 2 % jelly 1 Application (1 Application Topical Given 04/28/23 2252)     IMPRESSION / MDM / ASSESSMENT AND PLAN / ED COURSE  I reviewed the triage vital signs and the nursing notes.  Differential diagnosis includes, but is not limited to,  dislodged catheter, no evidence of other acute intra-abdominal process  Patient's presentation is most consistent with acute, uncomplicated illness.  63 year old female presenting for dislodged suprapubic catheter.  Given recent placement, case was reviewed with Dr. Liliane Shi with urology.  He did report that he felt it was safe to attempt bedside replacement of patient's catheter.  If placement was unsuccessful, he did recommend discussion with IR as they initially placed the catheter.  Multiple attempts were placed to place suprapubic catheter.  Initial attempts were unsuccessful, so case was discussed with Dr. Miles Costain with interventional radiology.  He reported that a smaller catheter could be used, but if any catheter was placed, he can arrange outpatient follow-up for the patient to get the patient back up to  appropriate size.  A 14 French catheter was ultimately able to be placed with return of blood-tinged fluid.  Patient is comfortable discharge home.  She will arrange follow-up with IR and continue to follow-up with urology.  Strict return precautions provided.  Patient discharged stable condition.      FINAL CLINICAL IMPRESSION(S) / ED DIAGNOSES   Final diagnoses:  Suprapubic catheter dysfunction, initial encounter [facility])     Rx / DC Orders   ED Discharge Orders     None        Note:  This document was prepared using Dragon voice recognition software and may include unintentional dictation errors.   Trinna Post, MD 04/28/23 [phone]

## 2023-04-28 NOTE — Telephone Encounter (Signed)
    Insurance is asking if PT has tried these formulary meds-I could not find proof of these meds having been tried and failed-Insurance is asking for specific medical reasons as to why the PT is not able to use the listed formulary alternatives. Please advise.

## 2023-04-28 NOTE — Discharge Instructions (Signed)
 You were seen for your dislodged suprapubic catheter.  We were able to place a smaller catheter in place.  Follow-up with the interventional radiology team as well as your urology team.  Return to the ER for new or worsening symptoms.

## 2023-04-29 DIAGNOSIS — R6889 Other general symptoms and signs: Secondary | ICD-10-CM | POA: Diagnosis not present

## 2023-04-29 DIAGNOSIS — R569 Unspecified convulsions: Secondary | ICD-10-CM | POA: Diagnosis not present

## 2023-04-29 DIAGNOSIS — Z7401 Bed confinement status: Secondary | ICD-10-CM | POA: Diagnosis not present

## 2023-04-29 DIAGNOSIS — Z743 Need for continuous supervision: Secondary | ICD-10-CM | POA: Diagnosis not present

## 2023-04-29 NOTE — ED Notes (Signed)
 Called [facility] 11:49 spoke with rep. Onalee Hua gave him all pertinent information about the pt. Rep stated a truck will be her to get the pt. by 1:30 A.M.

## 2023-05-01 ENCOUNTER — Telehealth: Payer: Self-pay | Admitting: Pharmacist

## 2023-05-01 DIAGNOSIS — N10 Acute tubulo-interstitial nephritis: Secondary | ICD-10-CM | POA: Diagnosis not present

## 2023-05-01 DIAGNOSIS — T83511A Infection and inflammatory reaction due to indwelling urethral catheter, initial encounter: Secondary | ICD-10-CM | POA: Diagnosis not present

## 2023-05-01 DIAGNOSIS — Z435 Encounter for attention to cystostomy: Secondary | ICD-10-CM | POA: Diagnosis not present

## 2023-05-01 DIAGNOSIS — Z853 Personal history of malignant neoplasm of breast: Secondary | ICD-10-CM | POA: Diagnosis not present

## 2023-05-01 DIAGNOSIS — E871 Hypo-osmolality and hyponatremia: Secondary | ICD-10-CM | POA: Diagnosis not present

## 2023-05-01 DIAGNOSIS — I890 Lymphedema, not elsewhere classified: Secondary | ICD-10-CM | POA: Diagnosis not present

## 2023-05-01 DIAGNOSIS — G35 Multiple sclerosis: Secondary | ICD-10-CM | POA: Diagnosis not present

## 2023-05-01 DIAGNOSIS — D649 Anemia, unspecified: Secondary | ICD-10-CM | POA: Diagnosis not present

## 2023-05-01 DIAGNOSIS — K581 Irritable bowel syndrome with constipation: Secondary | ICD-10-CM | POA: Diagnosis not present

## 2023-05-01 DIAGNOSIS — G8220 Paraplegia, unspecified: Secondary | ICD-10-CM | POA: Diagnosis not present

## 2023-05-01 DIAGNOSIS — R1310 Dysphagia, unspecified: Secondary | ICD-10-CM | POA: Diagnosis not present

## 2023-05-01 DIAGNOSIS — N319 Neuromuscular dysfunction of bladder, unspecified: Secondary | ICD-10-CM | POA: Diagnosis not present

## 2023-05-01 DIAGNOSIS — G894 Chronic pain syndrome: Secondary | ICD-10-CM | POA: Diagnosis not present

## 2023-05-01 DIAGNOSIS — Z9049 Acquired absence of other specified parts of digestive tract: Secondary | ICD-10-CM | POA: Diagnosis not present

## 2023-05-01 DIAGNOSIS — I5032 Chronic diastolic (congestive) heart failure: Secondary | ICD-10-CM | POA: Diagnosis not present

## 2023-05-01 DIAGNOSIS — I081 Rheumatic disorders of both mitral and tricuspid valves: Secondary | ICD-10-CM | POA: Diagnosis not present

## 2023-05-01 DIAGNOSIS — K219 Gastro-esophageal reflux disease without esophagitis: Secondary | ICD-10-CM | POA: Diagnosis not present

## 2023-05-01 DIAGNOSIS — H900 Conductive hearing loss, bilateral: Secondary | ICD-10-CM | POA: Diagnosis not present

## 2023-05-01 DIAGNOSIS — E876 Hypokalemia: Secondary | ICD-10-CM | POA: Diagnosis not present

## 2023-05-01 DIAGNOSIS — K8689 Other specified diseases of pancreas: Secondary | ICD-10-CM | POA: Diagnosis not present

## 2023-05-01 DIAGNOSIS — M4802 Spinal stenosis, cervical region: Secondary | ICD-10-CM | POA: Diagnosis not present

## 2023-05-01 LAB — CULTURE, URINE COMPREHENSIVE

## 2023-05-01 NOTE — Telephone Encounter (Signed)
 Will forward to our Pharmacist for appeals review.

## 2023-05-01 NOTE — Telephone Encounter (Signed)
 Appeal has been submitted Rebif Rebidose. Will advise when response is received. Appeal letter and supporting documentation have been faxed to [phone] on 05/01/2023 @4 :07 pm.  Thank you, Dellie Burns, PharmD Clinical Pharmacist  [place]  Direct Dial: [phone]

## 2023-05-01 NOTE — Telephone Encounter (Signed)
 Pharmacy Patient Advocate Encounter  Received notification from [facility] that Prior Authorization for Rebif has been DENIED.  Full denial letter will be uploaded to the media tab. See denial reason below.   PA #/Case ID/Reference #: [id number]

## 2023-05-01 NOTE — Telephone Encounter (Signed)
 Rebif denied. Please advise if you would like pt to try the formulary alternative or appeal for rebif. Send response to pod 3 call

## 2023-05-02 ENCOUNTER — Other Ambulatory Visit: Payer: Self-pay

## 2023-05-02 MED ORDER — TETRACYCLINE HCL 500 MG PO CAPS: 500.0000 mg | ORAL_CAPSULE | Freq: Two times a day (BID) | ORAL | 0 refills | Status: DC

## 2023-05-03 DIAGNOSIS — H900 Conductive hearing loss, bilateral: Secondary | ICD-10-CM | POA: Diagnosis not present

## 2023-05-03 DIAGNOSIS — G35 Multiple sclerosis: Secondary | ICD-10-CM | POA: Diagnosis not present

## 2023-05-03 DIAGNOSIS — I890 Lymphedema, not elsewhere classified: Secondary | ICD-10-CM | POA: Diagnosis not present

## 2023-05-03 DIAGNOSIS — D649 Anemia, unspecified: Secondary | ICD-10-CM | POA: Diagnosis not present

## 2023-05-03 DIAGNOSIS — E876 Hypokalemia: Secondary | ICD-10-CM | POA: Diagnosis not present

## 2023-05-03 DIAGNOSIS — N10 Acute tubulo-interstitial nephritis: Secondary | ICD-10-CM | POA: Diagnosis not present

## 2023-05-03 DIAGNOSIS — G8220 Paraplegia, unspecified: Secondary | ICD-10-CM | POA: Diagnosis not present

## 2023-05-03 DIAGNOSIS — I081 Rheumatic disorders of both mitral and tricuspid valves: Secondary | ICD-10-CM | POA: Diagnosis not present

## 2023-05-03 DIAGNOSIS — N319 Neuromuscular dysfunction of bladder, unspecified: Secondary | ICD-10-CM | POA: Diagnosis not present

## 2023-05-03 DIAGNOSIS — I5032 Chronic diastolic (congestive) heart failure: Secondary | ICD-10-CM | POA: Diagnosis not present

## 2023-05-03 DIAGNOSIS — K219 Gastro-esophageal reflux disease without esophagitis: Secondary | ICD-10-CM | POA: Diagnosis not present

## 2023-05-03 DIAGNOSIS — Z853 Personal history of malignant neoplasm of breast: Secondary | ICD-10-CM | POA: Diagnosis not present

## 2023-05-03 DIAGNOSIS — K581 Irritable bowel syndrome with constipation: Secondary | ICD-10-CM | POA: Diagnosis not present

## 2023-05-03 DIAGNOSIS — M4802 Spinal stenosis, cervical region: Secondary | ICD-10-CM | POA: Diagnosis not present

## 2023-05-03 DIAGNOSIS — G894 Chronic pain syndrome: Secondary | ICD-10-CM | POA: Diagnosis not present

## 2023-05-03 DIAGNOSIS — E871 Hypo-osmolality and hyponatremia: Secondary | ICD-10-CM | POA: Diagnosis not present

## 2023-05-03 DIAGNOSIS — T83511A Infection and inflammatory reaction due to indwelling urethral catheter, initial encounter: Secondary | ICD-10-CM | POA: Diagnosis not present

## 2023-05-03 DIAGNOSIS — Z9049 Acquired absence of other specified parts of digestive tract: Secondary | ICD-10-CM | POA: Diagnosis not present

## 2023-05-03 DIAGNOSIS — R1310 Dysphagia, unspecified: Secondary | ICD-10-CM | POA: Diagnosis not present

## 2023-05-03 DIAGNOSIS — K8689 Other specified diseases of pancreas: Secondary | ICD-10-CM | POA: Diagnosis not present

## 2023-05-03 DIAGNOSIS — Z435 Encounter for attention to cystostomy: Secondary | ICD-10-CM | POA: Diagnosis not present

## 2023-05-04 DIAGNOSIS — I5032 Chronic diastolic (congestive) heart failure: Secondary | ICD-10-CM | POA: Diagnosis not present

## 2023-05-04 DIAGNOSIS — K8689 Other specified diseases of pancreas: Secondary | ICD-10-CM | POA: Diagnosis not present

## 2023-05-04 DIAGNOSIS — I890 Lymphedema, not elsewhere classified: Secondary | ICD-10-CM | POA: Diagnosis not present

## 2023-05-04 DIAGNOSIS — R1310 Dysphagia, unspecified: Secondary | ICD-10-CM | POA: Diagnosis not present

## 2023-05-04 DIAGNOSIS — E871 Hypo-osmolality and hyponatremia: Secondary | ICD-10-CM | POA: Diagnosis not present

## 2023-05-04 DIAGNOSIS — N319 Neuromuscular dysfunction of bladder, unspecified: Secondary | ICD-10-CM | POA: Diagnosis not present

## 2023-05-04 DIAGNOSIS — Z853 Personal history of malignant neoplasm of breast: Secondary | ICD-10-CM | POA: Diagnosis not present

## 2023-05-04 DIAGNOSIS — Z435 Encounter for attention to cystostomy: Secondary | ICD-10-CM | POA: Diagnosis not present

## 2023-05-04 DIAGNOSIS — M4802 Spinal stenosis, cervical region: Secondary | ICD-10-CM | POA: Diagnosis not present

## 2023-05-04 DIAGNOSIS — I081 Rheumatic disorders of both mitral and tricuspid valves: Secondary | ICD-10-CM | POA: Diagnosis not present

## 2023-05-04 DIAGNOSIS — Z9049 Acquired absence of other specified parts of digestive tract: Secondary | ICD-10-CM | POA: Diagnosis not present

## 2023-05-04 DIAGNOSIS — H900 Conductive hearing loss, bilateral: Secondary | ICD-10-CM | POA: Diagnosis not present

## 2023-05-04 DIAGNOSIS — G894 Chronic pain syndrome: Secondary | ICD-10-CM | POA: Diagnosis not present

## 2023-05-04 DIAGNOSIS — E876 Hypokalemia: Secondary | ICD-10-CM | POA: Diagnosis not present

## 2023-05-04 DIAGNOSIS — K219 Gastro-esophageal reflux disease without esophagitis: Secondary | ICD-10-CM | POA: Diagnosis not present

## 2023-05-04 DIAGNOSIS — G35 Multiple sclerosis: Secondary | ICD-10-CM | POA: Diagnosis not present

## 2023-05-04 DIAGNOSIS — G8220 Paraplegia, unspecified: Secondary | ICD-10-CM | POA: Diagnosis not present

## 2023-05-04 DIAGNOSIS — T83511A Infection and inflammatory reaction due to indwelling urethral catheter, initial encounter: Secondary | ICD-10-CM | POA: Diagnosis not present

## 2023-05-04 DIAGNOSIS — K581 Irritable bowel syndrome with constipation: Secondary | ICD-10-CM | POA: Diagnosis not present

## 2023-05-04 DIAGNOSIS — N10 Acute tubulo-interstitial nephritis: Secondary | ICD-10-CM | POA: Diagnosis not present

## 2023-05-04 DIAGNOSIS — D649 Anemia, unspecified: Secondary | ICD-10-CM | POA: Diagnosis not present

## 2023-05-04 NOTE — Progress Notes (Signed)
 Patient for IR Cath Tube Exchange on Friday 05/05/23, I called and spoke with the patient on the phone and gave pre-procedure instructions. Pt was made aware to be here at 12p, NPO after MN prior to procedure as well as driver post procedure/recovery/discharge. Pt stated understanding.  Called 05/01/23

## 2023-05-05 ENCOUNTER — Other Ambulatory Visit: Payer: Self-pay

## 2023-05-05 ENCOUNTER — Ambulatory Visit
Admission: RE | Admit: 2023-05-05 | Discharge: 2023-05-05 | Disposition: A | Discharge status: Home / Self Care | Source: Ambulatory Visit | Attending: Radiology | Admitting: Radiology

## 2023-05-05 ENCOUNTER — Encounter: Payer: Self-pay | Admitting: Radiology

## 2023-05-05 DIAGNOSIS — R531 Weakness: Secondary | ICD-10-CM | POA: Diagnosis not present

## 2023-05-05 DIAGNOSIS — Z9221 Personal history of antineoplastic chemotherapy: Secondary | ICD-10-CM | POA: Diagnosis not present

## 2023-05-05 DIAGNOSIS — T83020A Displacement of cystostomy catheter, initial encounter: Secondary | ICD-10-CM | POA: Diagnosis not present

## 2023-05-05 DIAGNOSIS — Z853 Personal history of malignant neoplasm of breast: Secondary | ICD-10-CM | POA: Insufficient documentation

## 2023-05-05 DIAGNOSIS — N319 Neuromuscular dysfunction of bladder, unspecified: Secondary | ICD-10-CM | POA: Diagnosis not present

## 2023-05-05 DIAGNOSIS — R569 Unspecified convulsions: Secondary | ICD-10-CM | POA: Insufficient documentation

## 2023-05-05 DIAGNOSIS — G35 Multiple sclerosis: Secondary | ICD-10-CM | POA: Diagnosis not present

## 2023-05-05 DIAGNOSIS — K589 Irritable bowel syndrome without diarrhea: Secondary | ICD-10-CM | POA: Insufficient documentation

## 2023-05-05 DIAGNOSIS — Z7401 Bed confinement status: Secondary | ICD-10-CM | POA: Diagnosis not present

## 2023-05-05 DIAGNOSIS — K219 Gastro-esophageal reflux disease without esophagitis: Secondary | ICD-10-CM | POA: Insufficient documentation

## 2023-05-05 HISTORY — PX: IR CATHETER TUBE CHANGE: IMG717

## 2023-05-05 MED ORDER — LIDOCAINE HCL 1 % IJ SOLN
INTRAMUSCULAR | Status: AC
  Filled 2023-05-05: qty 20

## 2023-05-05 MED ORDER — HEPARIN SOD (PORK) LOCK FLUSH 100 UNIT/ML IV SOLN
INTRAVENOUS | Status: AC
  Filled 2023-05-05: qty 5

## 2023-05-05 MED ORDER — SODIUM CHLORIDE 0.9 % IV SOLN: INTRAVENOUS | Status: DC

## 2023-05-05 MED ORDER — IOHEXOL 300 MG/ML  SOLN
10.0000 mL | Freq: Once | INTRAMUSCULAR | Status: AC | PRN
  Administered 2023-05-05: 10 mL

## 2023-05-05 MED ORDER — LIDOCAINE VISCOUS HCL 2 % MT SOLN
OROMUCOSAL | Status: AC
  Filled 2023-05-05: qty 15

## 2023-05-05 MED ORDER — SODIUM CHLORIDE 0.9 % IV SOLN
2.0000 g | Freq: Once | INTRAVENOUS | Status: AC
  Administered 2023-05-05: 2 g via INTRAVENOUS
  Filled 2023-05-05: qty 20

## 2023-05-05 MED ORDER — DEXTROSE 5 % IV SOLN: 2.0000 g | INTRAVENOUS | Status: DC

## 2023-05-05 NOTE — Progress Notes (Signed)
 Pt alert and oriented. [facility] arrived for transport to home. Discharge info given to patient. Port deaccessed

## 2023-05-05 NOTE — Procedures (Signed)
 Interventional Radiology Procedure Note  Procedure: Exchange of suprapubic catheter  Complications: None  Estimated Blood Loss: None  Findings: 14 Fr Foley catheter exchanged for 16 Fr [place] Foley over wire under fluoro into bladder via suprapubic tract.   Jodi Marble. Fredia Sorrow, M.D Pager:  [phone]

## 2023-05-05 NOTE — H&P (Signed)
 Chief Complaint: Neurogenic bladder; status post suprapubic catheter on 03/23/2023 by Dr.Shick  Referring Provider(s): Carman Ching   Supervising Physician: Irish Lack  Patient Status: [facility] - Out-pt  History of Present Illness: Rebecca [name] is a 63 y.o. female with a past medical history of seizures, IBS, GERD, breast cancer (status post chemo), and multiple sclerosis.  She also had a chronic Foley catheter for neurogenic bladder and experienced frequent infections with this, referred to IR for suprapubic catheter which was placed on 03/23/2023 (9F).    On 04/28/2023 patient was seen in ER for dislodged suprapubic catheter.  ER MD was able to place a 14 French foley catheter into the SP tract and patient was discharged home.  She returns today via EMS non-emergent transport for SP catheter to be up sized and exchanged to a [place] tip catheter. She is currently taking tetracycline PO for UTI identified by urology.   Upon arrival she reports to staff that she ate this morning at 10am. She understands and agrees with plan to proceed with local only. At time of exam she is lying in bed, calm, cooperative.    Patient is Full Code  Past Medical History:  Diagnosis Date   Allergy    Aortic valve disease    Mild AS / AI - most recent Echo demonstrated tricuspid aortic valve.   Bacterial endocarditis    History of .   Bilateral impacted cerumen 06/29/2021   Bilateral lower extremity edema    Noncardiac.  Chronic. LE Venous dopplers - negative for DVT.; Echocardiogram January 2016: Normal EF with normal wall motion and valve function. Only grade 1 diastolic dysfunction. EF 60-65%. Mild MR   Breast cancer (HCC) 12/31/2013   Right breast, 12:00, 1.5 cm, T1c,N0 invasive mammary carcinoma, triple negative. --> Rx with Chemo   Cervical stenosis of spine    GERD (gastroesophageal reflux disease) April or May 2022   Heart murmur 2013   Herpes zoster    IBS (irritable bowel  syndrome)    Lymphedema    has legs wrapped at [facility]   Multiple sclerosis (HCC) 2001   Walks from room to room @ home; but Wheelchair when going out.   Neuromuscular disorder (HCC)    MS   PONV (postoperative nausea and vomiting)    Related to Fentanyl   Seizures (HCC)    Takes Keppra   SOB (shortness of breath) 03/01/2013   Syncope and collapse    Urinary tract infection associated with indwelling urethral catheter (HCC) 02/17/2020    Past Surgical History:  Procedure Laterality Date   ANKLE SURGERY     Left   ANKLE SURGERY     ANTERIOR CERVICAL DECOMP/DISCECTOMY FUSION  11/17/2011   Procedure: ANTERIOR CERVICAL DECOMPRESSION/DISCECTOMY FUSION 2 LEVELS;  Surgeon: Maeola Harman, MD;  Location: [facility];  Service: Neurosurgery;  Laterality: N/A;  Cervical Five-Six Six-Seven Anterior cervical decompression/diskectomy/fusion   BREAST BIOPSY Right 12/31/2013   invasive mammary   BREAST SURGERY Right 02/03/2014   Right simple mastectomy with sentinel node biopsy.   CHOLECYSTECTOMY     COLONOSCOPY  2014   ESOPHAGOGASTRODUODENOSCOPY (EGD) WITH PROPOFOL N/A 07/16/2020   Procedure: ESOPHAGOGASTRODUODENOSCOPY (EGD) WITH PROPOFOL;  Surgeon: Pasty Spillers, MD;  Location: [facility] ENDOSCOPY;  Service: Endoscopy;  Laterality: N/A;  Patient has MS and will need assistance   HYSTEROSCOPY WITH D & C N/A 11/27/2018   Procedure: DILATATION AND CURETTAGE /HYSTEROSCOPY;  Surgeon: Nadara Mustard, MD;  Location: [facility] ORS;  Service:  Gynecology;  Laterality: N/A;   IR CYSTOSTOMY TUBE PLACEMENT/BLADDER ASPIRATION  03/23/2023   Lower extremity venous Dopplers  02/27/2013   No LE DVT   MASTECTOMY Right 2015   ORIF TIBIA PLATEAU Left 12/09/2020   Procedure: LEFT OPEN REDUCTION INTERNAL FIXATION (ORIF) TIBIAL PLATEAU;  Surgeon: Roby Lofts, MD;  Location: [facility] OR;  Service: Orthopedics;  Laterality: Left;   Port a cath insertion Right 01/19/2010   PORT-A-CATH REMOVAL     right   PORT-A-CATH  REMOVAL Right 09/03/2013   Procedure: REMOVAL PORT-A-CATH;  Surgeon: Fransisco Hertz, MD;  Location: [facility] OR;  Service: Vascular;  Laterality: Right;   SPINE SURGERY  Cervical steinois Dr. Venetia Maxon 2014 I think?   TONSILLECTOMY     TRANSTHORACIC ECHOCARDIOGRAM  03/2013; 02/2014   a) Normal LV size and function with EF 60-65%.; Cannot exclude bicuspid aortic valve with mild AS and mild AI.; b) Normal EF with normal wall motion and valve function x Mild MR. G2 DD. EF 60-65%. Tricuspid AoV   UPPER GI ENDOSCOPY  2014    Allergies: Fentanyl and Sulfa antibiotics  Medications: Prior to Admission medications   Medication Sig Start Date End Date Taking? Authorizing Provider  amantadine (SYMMETREL) 100 MG capsule TAKE 1 CAPSULE BY MOUTH THREE TIMES A DAY 04/17/23   Penumalli, Glenford Bayley, MD  baclofen (LIORESAL) 10 MG tablet Take 1-2 tablets (10-20 mg total) by mouth 3 (three) times daily. 08/15/22   Penumalli, Glenford Bayley, MD  gluconic acid-citric acid (RENACIDIN) irrigation Insert 30 mL into suprapubic tube and clamp tube for 10 minutes and then drain twice daily 09/09/22   McGowan, Carollee Herter A, PA-C  Glucosamine-Chondroitin (COSAMIN DS PO) Take 1 tablet by mouth 3 (three) times daily.    [provider]  levETIRAcetam (KEPPRA) 500 MG tablet Take 1 tablet (500 mg total) by mouth 2 (two) times daily. 08/15/22   Penumalli, Glenford Bayley, MD  lipase/protease/amylase (CREON) 36000 UNITS CPEP capsule Take 2 capsules (72,000 Units total) by mouth 3 (three) times daily with meals AND 1 capsule (36,000 Units total) with snacks. Patient taking differently: Take 1-2 capsules (36,000- 72,000 Units total) by mouth with (three) times daily with meals AND 1 capsule (36,000 Units total) with snacks. (1 capsule by mouth with breakfast, 2 capsules by mouth with lunch and 1 capsule by mouth with dinner)   08/25/22 08/20/23  Celso Amy, PA-C  loratadine (CLARITIN) 10 MG tablet Take 10 mg by mouth daily.    [provider]  Misc  Natural Products (LEG VEIN & CIRCULATION) TABS Take 1 tablet by mouth 2 (two) times daily.     [provider]  Multiple Vitamins-Minerals (MULTIVITAMIN PO) Take 1 tablet by mouth daily.     [provider]  nystatin (MYCOSTATIN/NYSTOP) powder Apply 1 application topically 3 (three) times daily as needed. For rash/raw skin as needed 07/31/19   Danelle Berry, PA-C  omeprazole (PRILOSEC) 40 MG capsule TAKE 1 CAPSULE (40 MG TOTAL) BY MOUTH DAILY. 01/16/23   Danelle Berry, PA-C  oxybutynin (DITROPAN-XL) 10 MG 24 hr tablet Take 1 tablet (10 mg total) by mouth 2 (two) times daily. 01/16/23   McGowan, Carollee Herter A, PA-C  REBIF REBIDOSE 44 MCG/0.5ML SOAJ Inject 0.5 mLs into the skin 3 (three) times a week. 04/24/23 07/23/23  Penumalli, Glenford Bayley, MD  tetracycline (SUMYCIN) 500 MG capsule Take 1 capsule (500 mg total) by mouth 2 (two) times daily. 05/02/23   Michiel Cowboy A, PA-C  tiZANidine (ZANAFLEX) 4 MG  tablet Take 1 tablet (4 mg total) by mouth at bedtime. 08/15/22   Penumalli, Glenford Bayley, MD  Turmeric 500 MG CAPS Take 500 mg by mouth daily.    [provider]     Family History  Problem Relation Age of Onset   Cancer Father        skin   Heart disease Father    Heart attack Father        heart attack in his 59's   Thyroid disease Sister    Ovarian cancer Cousin    Breast cancer Maternal Aunt 40   Breast cancer Maternal Grandmother 37   Bladder Cancer Neg Hx    Kidney cancer Neg Hx     Social History   Socioeconomic History   Marital status: Married    Spouse name: don   Number of children: 0   Years of education: 12   Highest education level: 12th grade  Occupational History   Occupation: disability  Tobacco Use   Smoking status: Never   Smokeless tobacco: Never   Tobacco comments:    Never smoked. My mom smoked. Passed 04/18/20. Dad smoked, quit 1973. Sister smokes.  Vaping Use   Vaping status: Never Used  Substance and Sexual Activity   Alcohol use: No   Drug  use: No   Sexual activity: Not Currently    Partners: Male    Birth control/protection: None  Other Topics Concern   Not on file  Social History Narrative   She is married. Recently moved back to [place] after being in [place] for some time. She is accompanied by her husband and aunt.Never smoked. Never used alcohol.      1/29: husband currently in SNF in [place]. Aunt lives with her but unable to help care for her. Home health comes in once daily to change her. She does not get out of her recliner per patient   Social Drivers of Health   Financial Resource Strain: Low Risk  (12/03/2022)   Overall Financial Resource Strain (CARDIA)    Difficulty of Paying Living Expenses: Not hard at all  Food Insecurity: No Food Insecurity (04/04/2023)   Hunger Vital Sign    Worried About Running Out of Food in the Last Year: Never true    Ran Out of Food in the Last Year: Never true  Transportation Needs: Unmet Transportation Needs (04/04/2023)   PRAPARE - Transportation    Lack of Transportation (Medical): Yes    Lack of Transportation (Non-Medical): Yes  Physical Activity: Inactive (12/03/2022)   Exercise Vital Sign    Days of Exercise per Week: 0 days    Minutes of Exercise per Session: 30 min  Stress: No Stress Concern Present (12/03/2022)   [employer] of Occupational Health - Occupational Stress Questionnaire    Feeling of Stress : Not at all  Social Connections: Moderately Isolated (03/14/2023)   Social Connection and Isolation Panel [NHANES]    Frequency of Communication with Friends and Family: More than three times a week    Frequency of Social Gatherings with Friends and Family: More than three times a week    Attends Religious Services: Never    Database administrator or Organizations: No    Attends Banker Meetings: Never    Marital Status: Married     Review of Systems  Constitutional:  Negative for chills and fever.  Respiratory:  Negative  for shortness of breath.   Cardiovascular:  Positive for leg swelling. Negative  for chest pain.       Chronic  Gastrointestinal:  Negative for abdominal pain, blood in stool, nausea and vomiting.  Genitourinary:  Negative for hematuria.  Hematological:  Does not bruise/bleed easily.     Vital Signs: BP (!) 141/90   Pulse 83   Temp 97.8 F (36.6 C) (Oral)   Resp 16   Ht 5\' 4"  (1.626 m)   Wt 223 lb (101.2 kg)   SpO2 96%   BMI 38.28 kg/m     Physical Exam Constitutional:      Appearance: She is obese.  Abdominal:     Palpations: Abdomen is soft.     Tenderness: There is abdominal tenderness.     Comments: Mild tenderness near SP insertion site. A foley is present in SP tract and draining yellow urine, crusting noted. Wound consistent with moisture and irritation from adhesive present superior to SP insertion site  Musculoskeletal:     Right lower leg: Edema present.     Left lower leg: Edema present.  Skin:    General: Skin is warm and dry.  Neurological:     Mental Status: She is alert and oriented to person, place, and time.  Psychiatric:        Mood and Affect: Mood normal.        Behavior: Behavior normal.      Imaging: No results found.  Labs:  CBC: Recent Labs    03/25/23 0600 03/26/23 0424 03/29/23 0550 03/30/23 0938 03/31/23 0546  WBC 12.7* 7.6 3.3*  --  2.8*  HGB 10.6* 10.8* 10.1* 9.7* 9.3*  HCT 32.1* 32.0* 30.0*  --  27.6*  PLT 116* 124* 158  --  184    COAGS: Recent Labs    03/08/23 1514 03/23/23 1153 03/24/23 1317  INR 1.2 1.0 1.2  APTT  --   --  37*    BMP: Recent Labs    03/12/23 0438 03/24/23 1317 03/25/23 0600 03/26/23 0424  NA 136 134* 133* 136  K 3.7 3.5 3.4* 3.6  CL 104 100 105 103  CO2 24 23 21* 23  GLUCOSE 87 151* 117* 102*  BUN 10 14 14 11   CALCIUM 8.4* 8.9 8.5* 8.6*  CREATININE 0.39* 0.57 0.77 0.54  GFRNONAA >60 >60 >60 >60    LIVER FUNCTION TESTS: Recent Labs    12/07/22 1117 03/08/23 1514  03/09/23 0557 03/24/23 1317  BILITOT 0.5 1.3* 0.8 1.0  AST 21 45* 76* 30  ALT 23 45* 61* 32  ALKPHOS  --  84 78 95  PROT 7.9 7.6 7.5 7.5  ALBUMIN  --  3.2* 2.9* 3.1*    TUMOR MARKERS: No results for input(s): "AFPTM", "CEA", "CA199", "CHROMGRNA" in the last 8760 hours.  Assessment and Plan:  Routine suprapubic catheter exchange, initial placement on 03/23/2023 - No labs indicated for exchange - local d/t not NPO since 6 hr prior, to be transported home via EMS nonemergent - Subsequent exchanges to be managed through the urology clinic where patient is already established as a patient. - VSS  Risks and benefits of suprapubic catheter exchange were discussed with the patient including bleeding, infection, damage to adjacent structures, bladder perforation/fistula connection, loss of acces, and sepsis.  All of the patient's questions were answered, patient is agreeable to proceed. Consent signed and in chart.   Thank you for allowing our service to participate in Rebecca [name] 's care.    Electronically Signed: Carlton Adam, NP   05/05/2023, 1:10 PM  I spent a total of  15 Minutes   in face to face in clinical consultation, greater than 50% of which was counseling/coordinating care for image guided suprapubic catheter exchange, s/p SP placement on 03/23/23 for neurogenic bladder.    (A copy of this note was sent to the referring provider and the time of visit.)

## 2023-05-08 NOTE — Telephone Encounter (Signed)
 Also received a paper fax completed and forwarded in to [employer] [facility] as well

## 2023-05-09 DIAGNOSIS — N319 Neuromuscular dysfunction of bladder, unspecified: Secondary | ICD-10-CM | POA: Diagnosis not present

## 2023-05-09 DIAGNOSIS — Z435 Encounter for attention to cystostomy: Secondary | ICD-10-CM | POA: Diagnosis not present

## 2023-05-09 DIAGNOSIS — K219 Gastro-esophageal reflux disease without esophagitis: Secondary | ICD-10-CM | POA: Diagnosis not present

## 2023-05-09 DIAGNOSIS — N10 Acute tubulo-interstitial nephritis: Secondary | ICD-10-CM | POA: Diagnosis not present

## 2023-05-09 DIAGNOSIS — K8689 Other specified diseases of pancreas: Secondary | ICD-10-CM | POA: Diagnosis not present

## 2023-05-09 DIAGNOSIS — G35 Multiple sclerosis: Secondary | ICD-10-CM | POA: Diagnosis not present

## 2023-05-09 DIAGNOSIS — I081 Rheumatic disorders of both mitral and tricuspid valves: Secondary | ICD-10-CM | POA: Diagnosis not present

## 2023-05-09 DIAGNOSIS — E871 Hypo-osmolality and hyponatremia: Secondary | ICD-10-CM | POA: Diagnosis not present

## 2023-05-09 DIAGNOSIS — R1310 Dysphagia, unspecified: Secondary | ICD-10-CM | POA: Diagnosis not present

## 2023-05-09 DIAGNOSIS — G894 Chronic pain syndrome: Secondary | ICD-10-CM | POA: Diagnosis not present

## 2023-05-09 DIAGNOSIS — H900 Conductive hearing loss, bilateral: Secondary | ICD-10-CM | POA: Diagnosis not present

## 2023-05-09 DIAGNOSIS — Z9049 Acquired absence of other specified parts of digestive tract: Secondary | ICD-10-CM | POA: Diagnosis not present

## 2023-05-09 DIAGNOSIS — D649 Anemia, unspecified: Secondary | ICD-10-CM | POA: Diagnosis not present

## 2023-05-09 DIAGNOSIS — Z853 Personal history of malignant neoplasm of breast: Secondary | ICD-10-CM | POA: Diagnosis not present

## 2023-05-09 DIAGNOSIS — M4802 Spinal stenosis, cervical region: Secondary | ICD-10-CM | POA: Diagnosis not present

## 2023-05-09 DIAGNOSIS — G8220 Paraplegia, unspecified: Secondary | ICD-10-CM | POA: Diagnosis not present

## 2023-05-09 DIAGNOSIS — T83511A Infection and inflammatory reaction due to indwelling urethral catheter, initial encounter: Secondary | ICD-10-CM | POA: Diagnosis not present

## 2023-05-09 DIAGNOSIS — I5032 Chronic diastolic (congestive) heart failure: Secondary | ICD-10-CM | POA: Diagnosis not present

## 2023-05-09 DIAGNOSIS — I890 Lymphedema, not elsewhere classified: Secondary | ICD-10-CM | POA: Diagnosis not present

## 2023-05-09 DIAGNOSIS — K581 Irritable bowel syndrome with constipation: Secondary | ICD-10-CM | POA: Diagnosis not present

## 2023-05-09 DIAGNOSIS — E876 Hypokalemia: Secondary | ICD-10-CM | POA: Diagnosis not present

## 2023-05-10 DIAGNOSIS — E871 Hypo-osmolality and hyponatremia: Secondary | ICD-10-CM | POA: Diagnosis not present

## 2023-05-10 DIAGNOSIS — R1310 Dysphagia, unspecified: Secondary | ICD-10-CM | POA: Diagnosis not present

## 2023-05-10 DIAGNOSIS — G35 Multiple sclerosis: Secondary | ICD-10-CM | POA: Diagnosis not present

## 2023-05-10 DIAGNOSIS — E876 Hypokalemia: Secondary | ICD-10-CM | POA: Diagnosis not present

## 2023-05-10 DIAGNOSIS — Z9049 Acquired absence of other specified parts of digestive tract: Secondary | ICD-10-CM | POA: Diagnosis not present

## 2023-05-10 DIAGNOSIS — K219 Gastro-esophageal reflux disease without esophagitis: Secondary | ICD-10-CM | POA: Diagnosis not present

## 2023-05-10 DIAGNOSIS — I5032 Chronic diastolic (congestive) heart failure: Secondary | ICD-10-CM | POA: Diagnosis not present

## 2023-05-10 DIAGNOSIS — G894 Chronic pain syndrome: Secondary | ICD-10-CM | POA: Diagnosis not present

## 2023-05-10 DIAGNOSIS — N10 Acute tubulo-interstitial nephritis: Secondary | ICD-10-CM | POA: Diagnosis not present

## 2023-05-10 DIAGNOSIS — K8689 Other specified diseases of pancreas: Secondary | ICD-10-CM | POA: Diagnosis not present

## 2023-05-10 DIAGNOSIS — D649 Anemia, unspecified: Secondary | ICD-10-CM | POA: Diagnosis not present

## 2023-05-10 DIAGNOSIS — G8220 Paraplegia, unspecified: Secondary | ICD-10-CM | POA: Diagnosis not present

## 2023-05-10 DIAGNOSIS — N319 Neuromuscular dysfunction of bladder, unspecified: Secondary | ICD-10-CM | POA: Diagnosis not present

## 2023-05-10 DIAGNOSIS — I081 Rheumatic disorders of both mitral and tricuspid valves: Secondary | ICD-10-CM | POA: Diagnosis not present

## 2023-05-10 DIAGNOSIS — H900 Conductive hearing loss, bilateral: Secondary | ICD-10-CM | POA: Diagnosis not present

## 2023-05-10 DIAGNOSIS — I890 Lymphedema, not elsewhere classified: Secondary | ICD-10-CM | POA: Diagnosis not present

## 2023-05-10 DIAGNOSIS — T83511A Infection and inflammatory reaction due to indwelling urethral catheter, initial encounter: Secondary | ICD-10-CM | POA: Diagnosis not present

## 2023-05-10 DIAGNOSIS — Z435 Encounter for attention to cystostomy: Secondary | ICD-10-CM | POA: Diagnosis not present

## 2023-05-10 DIAGNOSIS — K581 Irritable bowel syndrome with constipation: Secondary | ICD-10-CM | POA: Diagnosis not present

## 2023-05-10 DIAGNOSIS — Z853 Personal history of malignant neoplasm of breast: Secondary | ICD-10-CM | POA: Diagnosis not present

## 2023-05-10 DIAGNOSIS — M4802 Spinal stenosis, cervical region: Secondary | ICD-10-CM | POA: Diagnosis not present

## 2023-05-12 ENCOUNTER — Encounter: Payer: Self-pay | Admitting: Diagnostic Neuroimaging

## 2023-05-12 ENCOUNTER — Inpatient Hospital Stay: Payer: 59

## 2023-05-15 ENCOUNTER — Other Ambulatory Visit (HOSPITAL_COMMUNITY): Payer: Self-pay

## 2023-05-15 ENCOUNTER — Telehealth: Payer: Self-pay | Admitting: Pharmacist

## 2023-05-15 NOTE — Telephone Encounter (Signed)
 Per insurance, the appeal for Rebif Rebidose has been approved through 02/07/2024

## 2023-05-16 DIAGNOSIS — K8689 Other specified diseases of pancreas: Secondary | ICD-10-CM | POA: Diagnosis not present

## 2023-05-16 DIAGNOSIS — I890 Lymphedema, not elsewhere classified: Secondary | ICD-10-CM | POA: Diagnosis not present

## 2023-05-16 DIAGNOSIS — N319 Neuromuscular dysfunction of bladder, unspecified: Secondary | ICD-10-CM | POA: Diagnosis not present

## 2023-05-16 DIAGNOSIS — G8220 Paraplegia, unspecified: Secondary | ICD-10-CM | POA: Diagnosis not present

## 2023-05-16 DIAGNOSIS — I081 Rheumatic disorders of both mitral and tricuspid valves: Secondary | ICD-10-CM | POA: Diagnosis not present

## 2023-05-16 DIAGNOSIS — D649 Anemia, unspecified: Secondary | ICD-10-CM | POA: Diagnosis not present

## 2023-05-16 DIAGNOSIS — Z853 Personal history of malignant neoplasm of breast: Secondary | ICD-10-CM | POA: Diagnosis not present

## 2023-05-16 DIAGNOSIS — N10 Acute tubulo-interstitial nephritis: Secondary | ICD-10-CM | POA: Diagnosis not present

## 2023-05-16 DIAGNOSIS — G894 Chronic pain syndrome: Secondary | ICD-10-CM | POA: Diagnosis not present

## 2023-05-16 DIAGNOSIS — Z435 Encounter for attention to cystostomy: Secondary | ICD-10-CM | POA: Diagnosis not present

## 2023-05-16 DIAGNOSIS — K581 Irritable bowel syndrome with constipation: Secondary | ICD-10-CM | POA: Diagnosis not present

## 2023-05-16 DIAGNOSIS — Z9049 Acquired absence of other specified parts of digestive tract: Secondary | ICD-10-CM | POA: Diagnosis not present

## 2023-05-16 DIAGNOSIS — H900 Conductive hearing loss, bilateral: Secondary | ICD-10-CM | POA: Diagnosis not present

## 2023-05-16 DIAGNOSIS — E876 Hypokalemia: Secondary | ICD-10-CM | POA: Diagnosis not present

## 2023-05-16 DIAGNOSIS — K219 Gastro-esophageal reflux disease without esophagitis: Secondary | ICD-10-CM | POA: Diagnosis not present

## 2023-05-16 DIAGNOSIS — G35 Multiple sclerosis: Secondary | ICD-10-CM | POA: Diagnosis not present

## 2023-05-16 DIAGNOSIS — E871 Hypo-osmolality and hyponatremia: Secondary | ICD-10-CM | POA: Diagnosis not present

## 2023-05-16 DIAGNOSIS — M4802 Spinal stenosis, cervical region: Secondary | ICD-10-CM | POA: Diagnosis not present

## 2023-05-16 DIAGNOSIS — T83511A Infection and inflammatory reaction due to indwelling urethral catheter, initial encounter: Secondary | ICD-10-CM | POA: Diagnosis not present

## 2023-05-16 DIAGNOSIS — R1310 Dysphagia, unspecified: Secondary | ICD-10-CM | POA: Diagnosis not present

## 2023-05-16 DIAGNOSIS — I5032 Chronic diastolic (congestive) heart failure: Secondary | ICD-10-CM | POA: Diagnosis not present

## 2023-05-19 DIAGNOSIS — E871 Hypo-osmolality and hyponatremia: Secondary | ICD-10-CM | POA: Diagnosis not present

## 2023-05-19 DIAGNOSIS — M4802 Spinal stenosis, cervical region: Secondary | ICD-10-CM | POA: Diagnosis not present

## 2023-05-19 DIAGNOSIS — G8220 Paraplegia, unspecified: Secondary | ICD-10-CM | POA: Diagnosis not present

## 2023-05-19 DIAGNOSIS — K581 Irritable bowel syndrome with constipation: Secondary | ICD-10-CM | POA: Diagnosis not present

## 2023-05-19 DIAGNOSIS — I5032 Chronic diastolic (congestive) heart failure: Secondary | ICD-10-CM | POA: Diagnosis not present

## 2023-05-19 DIAGNOSIS — N319 Neuromuscular dysfunction of bladder, unspecified: Secondary | ICD-10-CM | POA: Diagnosis not present

## 2023-05-19 DIAGNOSIS — E876 Hypokalemia: Secondary | ICD-10-CM | POA: Diagnosis not present

## 2023-05-19 DIAGNOSIS — K219 Gastro-esophageal reflux disease without esophagitis: Secondary | ICD-10-CM | POA: Diagnosis not present

## 2023-05-19 DIAGNOSIS — Z435 Encounter for attention to cystostomy: Secondary | ICD-10-CM | POA: Diagnosis not present

## 2023-05-19 DIAGNOSIS — K8689 Other specified diseases of pancreas: Secondary | ICD-10-CM | POA: Diagnosis not present

## 2023-05-19 DIAGNOSIS — Z853 Personal history of malignant neoplasm of breast: Secondary | ICD-10-CM | POA: Diagnosis not present

## 2023-05-19 DIAGNOSIS — R1310 Dysphagia, unspecified: Secondary | ICD-10-CM | POA: Diagnosis not present

## 2023-05-19 DIAGNOSIS — T83511A Infection and inflammatory reaction due to indwelling urethral catheter, initial encounter: Secondary | ICD-10-CM | POA: Diagnosis not present

## 2023-05-19 DIAGNOSIS — I890 Lymphedema, not elsewhere classified: Secondary | ICD-10-CM | POA: Diagnosis not present

## 2023-05-19 DIAGNOSIS — I081 Rheumatic disorders of both mitral and tricuspid valves: Secondary | ICD-10-CM | POA: Diagnosis not present

## 2023-05-19 DIAGNOSIS — Z9049 Acquired absence of other specified parts of digestive tract: Secondary | ICD-10-CM | POA: Diagnosis not present

## 2023-05-19 DIAGNOSIS — N10 Acute tubulo-interstitial nephritis: Secondary | ICD-10-CM | POA: Diagnosis not present

## 2023-05-19 DIAGNOSIS — G35 Multiple sclerosis: Secondary | ICD-10-CM | POA: Diagnosis not present

## 2023-05-19 DIAGNOSIS — G894 Chronic pain syndrome: Secondary | ICD-10-CM | POA: Diagnosis not present

## 2023-05-19 DIAGNOSIS — H900 Conductive hearing loss, bilateral: Secondary | ICD-10-CM | POA: Diagnosis not present

## 2023-05-19 DIAGNOSIS — D649 Anemia, unspecified: Secondary | ICD-10-CM | POA: Diagnosis not present

## 2023-05-22 DIAGNOSIS — Z9049 Acquired absence of other specified parts of digestive tract: Secondary | ICD-10-CM | POA: Diagnosis not present

## 2023-05-22 DIAGNOSIS — M4802 Spinal stenosis, cervical region: Secondary | ICD-10-CM | POA: Diagnosis not present

## 2023-05-22 DIAGNOSIS — E876 Hypokalemia: Secondary | ICD-10-CM | POA: Diagnosis not present

## 2023-05-22 DIAGNOSIS — G8220 Paraplegia, unspecified: Secondary | ICD-10-CM | POA: Diagnosis not present

## 2023-05-22 DIAGNOSIS — E871 Hypo-osmolality and hyponatremia: Secondary | ICD-10-CM | POA: Diagnosis not present

## 2023-05-22 DIAGNOSIS — K8689 Other specified diseases of pancreas: Secondary | ICD-10-CM | POA: Diagnosis not present

## 2023-05-22 DIAGNOSIS — H900 Conductive hearing loss, bilateral: Secondary | ICD-10-CM | POA: Diagnosis not present

## 2023-05-22 DIAGNOSIS — N10 Acute tubulo-interstitial nephritis: Secondary | ICD-10-CM | POA: Diagnosis not present

## 2023-05-22 DIAGNOSIS — K581 Irritable bowel syndrome with constipation: Secondary | ICD-10-CM | POA: Diagnosis not present

## 2023-05-22 DIAGNOSIS — I5032 Chronic diastolic (congestive) heart failure: Secondary | ICD-10-CM | POA: Diagnosis not present

## 2023-05-22 DIAGNOSIS — N319 Neuromuscular dysfunction of bladder, unspecified: Secondary | ICD-10-CM | POA: Diagnosis not present

## 2023-05-22 DIAGNOSIS — K219 Gastro-esophageal reflux disease without esophagitis: Secondary | ICD-10-CM | POA: Diagnosis not present

## 2023-05-22 DIAGNOSIS — G35 Multiple sclerosis: Secondary | ICD-10-CM | POA: Diagnosis not present

## 2023-05-22 DIAGNOSIS — D649 Anemia, unspecified: Secondary | ICD-10-CM | POA: Diagnosis not present

## 2023-05-22 DIAGNOSIS — Z435 Encounter for attention to cystostomy: Secondary | ICD-10-CM | POA: Diagnosis not present

## 2023-05-22 DIAGNOSIS — I890 Lymphedema, not elsewhere classified: Secondary | ICD-10-CM | POA: Diagnosis not present

## 2023-05-22 DIAGNOSIS — I081 Rheumatic disorders of both mitral and tricuspid valves: Secondary | ICD-10-CM | POA: Diagnosis not present

## 2023-05-22 DIAGNOSIS — Z853 Personal history of malignant neoplasm of breast: Secondary | ICD-10-CM | POA: Diagnosis not present

## 2023-05-22 DIAGNOSIS — R1310 Dysphagia, unspecified: Secondary | ICD-10-CM | POA: Diagnosis not present

## 2023-05-22 DIAGNOSIS — G894 Chronic pain syndrome: Secondary | ICD-10-CM | POA: Diagnosis not present

## 2023-05-22 DIAGNOSIS — T83511A Infection and inflammatory reaction due to indwelling urethral catheter, initial encounter: Secondary | ICD-10-CM | POA: Diagnosis not present

## 2023-05-24 DIAGNOSIS — N319 Neuromuscular dysfunction of bladder, unspecified: Secondary | ICD-10-CM | POA: Diagnosis not present

## 2023-05-24 DIAGNOSIS — I890 Lymphedema, not elsewhere classified: Secondary | ICD-10-CM | POA: Diagnosis not present

## 2023-05-24 DIAGNOSIS — G894 Chronic pain syndrome: Secondary | ICD-10-CM | POA: Diagnosis not present

## 2023-05-24 DIAGNOSIS — E871 Hypo-osmolality and hyponatremia: Secondary | ICD-10-CM | POA: Diagnosis not present

## 2023-05-24 DIAGNOSIS — N10 Acute tubulo-interstitial nephritis: Secondary | ICD-10-CM | POA: Diagnosis not present

## 2023-05-24 DIAGNOSIS — K581 Irritable bowel syndrome with constipation: Secondary | ICD-10-CM | POA: Diagnosis not present

## 2023-05-24 DIAGNOSIS — I081 Rheumatic disorders of both mitral and tricuspid valves: Secondary | ICD-10-CM | POA: Diagnosis not present

## 2023-05-24 DIAGNOSIS — K8689 Other specified diseases of pancreas: Secondary | ICD-10-CM | POA: Diagnosis not present

## 2023-05-24 DIAGNOSIS — I5032 Chronic diastolic (congestive) heart failure: Secondary | ICD-10-CM | POA: Diagnosis not present

## 2023-05-24 DIAGNOSIS — M4802 Spinal stenosis, cervical region: Secondary | ICD-10-CM | POA: Diagnosis not present

## 2023-05-24 DIAGNOSIS — G8220 Paraplegia, unspecified: Secondary | ICD-10-CM | POA: Diagnosis not present

## 2023-05-24 DIAGNOSIS — Z435 Encounter for attention to cystostomy: Secondary | ICD-10-CM | POA: Diagnosis not present

## 2023-05-24 DIAGNOSIS — T83511A Infection and inflammatory reaction due to indwelling urethral catheter, initial encounter: Secondary | ICD-10-CM | POA: Diagnosis not present

## 2023-05-24 DIAGNOSIS — R1310 Dysphagia, unspecified: Secondary | ICD-10-CM | POA: Diagnosis not present

## 2023-05-24 DIAGNOSIS — E876 Hypokalemia: Secondary | ICD-10-CM | POA: Diagnosis not present

## 2023-05-24 DIAGNOSIS — Z853 Personal history of malignant neoplasm of breast: Secondary | ICD-10-CM | POA: Diagnosis not present

## 2023-05-24 DIAGNOSIS — G35 Multiple sclerosis: Secondary | ICD-10-CM | POA: Diagnosis not present

## 2023-05-24 DIAGNOSIS — K219 Gastro-esophageal reflux disease without esophagitis: Secondary | ICD-10-CM | POA: Diagnosis not present

## 2023-05-24 DIAGNOSIS — Z9049 Acquired absence of other specified parts of digestive tract: Secondary | ICD-10-CM | POA: Diagnosis not present

## 2023-05-24 DIAGNOSIS — D649 Anemia, unspecified: Secondary | ICD-10-CM | POA: Diagnosis not present

## 2023-05-24 DIAGNOSIS — H900 Conductive hearing loss, bilateral: Secondary | ICD-10-CM | POA: Diagnosis not present

## 2023-05-30 DIAGNOSIS — I890 Lymphedema, not elsewhere classified: Secondary | ICD-10-CM | POA: Diagnosis not present

## 2023-05-30 DIAGNOSIS — M4802 Spinal stenosis, cervical region: Secondary | ICD-10-CM | POA: Diagnosis not present

## 2023-05-30 DIAGNOSIS — Z853 Personal history of malignant neoplasm of breast: Secondary | ICD-10-CM | POA: Diagnosis not present

## 2023-05-30 DIAGNOSIS — N10 Acute tubulo-interstitial nephritis: Secondary | ICD-10-CM | POA: Diagnosis not present

## 2023-05-30 DIAGNOSIS — Z435 Encounter for attention to cystostomy: Secondary | ICD-10-CM | POA: Diagnosis not present

## 2023-05-30 DIAGNOSIS — I081 Rheumatic disorders of both mitral and tricuspid valves: Secondary | ICD-10-CM | POA: Diagnosis not present

## 2023-05-30 DIAGNOSIS — G35 Multiple sclerosis: Secondary | ICD-10-CM | POA: Diagnosis not present

## 2023-05-30 DIAGNOSIS — T83511A Infection and inflammatory reaction due to indwelling urethral catheter, initial encounter: Secondary | ICD-10-CM | POA: Diagnosis not present

## 2023-05-30 DIAGNOSIS — Z9049 Acquired absence of other specified parts of digestive tract: Secondary | ICD-10-CM | POA: Diagnosis not present

## 2023-05-30 DIAGNOSIS — K581 Irritable bowel syndrome with constipation: Secondary | ICD-10-CM | POA: Diagnosis not present

## 2023-05-30 DIAGNOSIS — H900 Conductive hearing loss, bilateral: Secondary | ICD-10-CM | POA: Diagnosis not present

## 2023-05-30 DIAGNOSIS — E876 Hypokalemia: Secondary | ICD-10-CM | POA: Diagnosis not present

## 2023-05-30 DIAGNOSIS — I5032 Chronic diastolic (congestive) heart failure: Secondary | ICD-10-CM | POA: Diagnosis not present

## 2023-05-30 DIAGNOSIS — R1310 Dysphagia, unspecified: Secondary | ICD-10-CM | POA: Diagnosis not present

## 2023-05-30 DIAGNOSIS — N319 Neuromuscular dysfunction of bladder, unspecified: Secondary | ICD-10-CM | POA: Diagnosis not present

## 2023-05-30 DIAGNOSIS — K219 Gastro-esophageal reflux disease without esophagitis: Secondary | ICD-10-CM | POA: Diagnosis not present

## 2023-05-30 DIAGNOSIS — G894 Chronic pain syndrome: Secondary | ICD-10-CM | POA: Diagnosis not present

## 2023-05-30 DIAGNOSIS — K8689 Other specified diseases of pancreas: Secondary | ICD-10-CM | POA: Diagnosis not present

## 2023-05-30 DIAGNOSIS — E871 Hypo-osmolality and hyponatremia: Secondary | ICD-10-CM | POA: Diagnosis not present

## 2023-05-30 DIAGNOSIS — G8220 Paraplegia, unspecified: Secondary | ICD-10-CM | POA: Diagnosis not present

## 2023-05-30 DIAGNOSIS — D649 Anemia, unspecified: Secondary | ICD-10-CM | POA: Diagnosis not present

## 2023-05-31 DIAGNOSIS — Z9049 Acquired absence of other specified parts of digestive tract: Secondary | ICD-10-CM | POA: Diagnosis not present

## 2023-05-31 DIAGNOSIS — G35 Multiple sclerosis: Secondary | ICD-10-CM | POA: Diagnosis not present

## 2023-05-31 DIAGNOSIS — T83511A Infection and inflammatory reaction due to indwelling urethral catheter, initial encounter: Secondary | ICD-10-CM | POA: Diagnosis not present

## 2023-05-31 DIAGNOSIS — G894 Chronic pain syndrome: Secondary | ICD-10-CM | POA: Diagnosis not present

## 2023-05-31 DIAGNOSIS — G8220 Paraplegia, unspecified: Secondary | ICD-10-CM | POA: Diagnosis not present

## 2023-05-31 DIAGNOSIS — R1310 Dysphagia, unspecified: Secondary | ICD-10-CM | POA: Diagnosis not present

## 2023-05-31 DIAGNOSIS — N319 Neuromuscular dysfunction of bladder, unspecified: Secondary | ICD-10-CM | POA: Diagnosis not present

## 2023-05-31 DIAGNOSIS — N10 Acute tubulo-interstitial nephritis: Secondary | ICD-10-CM | POA: Diagnosis not present

## 2023-05-31 DIAGNOSIS — E876 Hypokalemia: Secondary | ICD-10-CM | POA: Diagnosis not present

## 2023-05-31 DIAGNOSIS — Z435 Encounter for attention to cystostomy: Secondary | ICD-10-CM | POA: Diagnosis not present

## 2023-05-31 DIAGNOSIS — M4802 Spinal stenosis, cervical region: Secondary | ICD-10-CM | POA: Diagnosis not present

## 2023-05-31 DIAGNOSIS — Z853 Personal history of malignant neoplasm of breast: Secondary | ICD-10-CM | POA: Diagnosis not present

## 2023-05-31 DIAGNOSIS — E871 Hypo-osmolality and hyponatremia: Secondary | ICD-10-CM | POA: Diagnosis not present

## 2023-05-31 DIAGNOSIS — H900 Conductive hearing loss, bilateral: Secondary | ICD-10-CM | POA: Diagnosis not present

## 2023-05-31 DIAGNOSIS — I5032 Chronic diastolic (congestive) heart failure: Secondary | ICD-10-CM | POA: Diagnosis not present

## 2023-05-31 DIAGNOSIS — D649 Anemia, unspecified: Secondary | ICD-10-CM | POA: Diagnosis not present

## 2023-05-31 DIAGNOSIS — K8689 Other specified diseases of pancreas: Secondary | ICD-10-CM | POA: Diagnosis not present

## 2023-05-31 DIAGNOSIS — I890 Lymphedema, not elsewhere classified: Secondary | ICD-10-CM | POA: Diagnosis not present

## 2023-05-31 DIAGNOSIS — I081 Rheumatic disorders of both mitral and tricuspid valves: Secondary | ICD-10-CM | POA: Diagnosis not present

## 2023-05-31 DIAGNOSIS — K219 Gastro-esophageal reflux disease without esophagitis: Secondary | ICD-10-CM | POA: Diagnosis not present

## 2023-05-31 DIAGNOSIS — K581 Irritable bowel syndrome with constipation: Secondary | ICD-10-CM | POA: Diagnosis not present

## 2023-06-01 ENCOUNTER — Telehealth: Payer: Self-pay

## 2023-06-01 DIAGNOSIS — N10 Acute tubulo-interstitial nephritis: Secondary | ICD-10-CM | POA: Diagnosis not present

## 2023-06-01 DIAGNOSIS — K8689 Other specified diseases of pancreas: Secondary | ICD-10-CM | POA: Diagnosis not present

## 2023-06-01 DIAGNOSIS — H900 Conductive hearing loss, bilateral: Secondary | ICD-10-CM | POA: Diagnosis not present

## 2023-06-01 DIAGNOSIS — E876 Hypokalemia: Secondary | ICD-10-CM | POA: Diagnosis not present

## 2023-06-01 DIAGNOSIS — M4802 Spinal stenosis, cervical region: Secondary | ICD-10-CM | POA: Diagnosis not present

## 2023-06-01 DIAGNOSIS — G8220 Paraplegia, unspecified: Secondary | ICD-10-CM | POA: Diagnosis not present

## 2023-06-01 DIAGNOSIS — E871 Hypo-osmolality and hyponatremia: Secondary | ICD-10-CM | POA: Diagnosis not present

## 2023-06-01 DIAGNOSIS — N319 Neuromuscular dysfunction of bladder, unspecified: Secondary | ICD-10-CM | POA: Diagnosis not present

## 2023-06-01 DIAGNOSIS — G894 Chronic pain syndrome: Secondary | ICD-10-CM | POA: Diagnosis not present

## 2023-06-01 DIAGNOSIS — I081 Rheumatic disorders of both mitral and tricuspid valves: Secondary | ICD-10-CM | POA: Diagnosis not present

## 2023-06-01 DIAGNOSIS — K219 Gastro-esophageal reflux disease without esophagitis: Secondary | ICD-10-CM | POA: Diagnosis not present

## 2023-06-01 DIAGNOSIS — K581 Irritable bowel syndrome with constipation: Secondary | ICD-10-CM | POA: Diagnosis not present

## 2023-06-01 DIAGNOSIS — Z435 Encounter for attention to cystostomy: Secondary | ICD-10-CM | POA: Diagnosis not present

## 2023-06-01 DIAGNOSIS — D649 Anemia, unspecified: Secondary | ICD-10-CM | POA: Diagnosis not present

## 2023-06-01 DIAGNOSIS — I890 Lymphedema, not elsewhere classified: Secondary | ICD-10-CM | POA: Diagnosis not present

## 2023-06-01 DIAGNOSIS — I5032 Chronic diastolic (congestive) heart failure: Secondary | ICD-10-CM | POA: Diagnosis not present

## 2023-06-01 DIAGNOSIS — R1310 Dysphagia, unspecified: Secondary | ICD-10-CM | POA: Diagnosis not present

## 2023-06-01 DIAGNOSIS — Z9049 Acquired absence of other specified parts of digestive tract: Secondary | ICD-10-CM | POA: Diagnosis not present

## 2023-06-01 DIAGNOSIS — G35 Multiple sclerosis: Secondary | ICD-10-CM | POA: Diagnosis not present

## 2023-06-01 DIAGNOSIS — Z853 Personal history of malignant neoplasm of breast: Secondary | ICD-10-CM | POA: Diagnosis not present

## 2023-06-01 DIAGNOSIS — T83511A Infection and inflammatory reaction due to indwelling urethral catheter, initial encounter: Secondary | ICD-10-CM | POA: Diagnosis not present

## 2023-06-01 NOTE — Telephone Encounter (Signed)
 Dee Farber, nurse with [facility], called asking for verbal orders for the patient for her SPT catheter to be changed at home by [facility] monthly and the new size is 16 FR that was up sized by IR on 05/05/23, they had original orders for 14 FR size when she had that. Patient does not have any f/u appointments with us  at this time, Dee Farber was not sure if they can go ahead and take over or does patient need to come in to follow up with us  on this changed first. CB is [phone].

## 2023-06-01 NOTE — Telephone Encounter (Signed)
 Rebecca [name] advised, patient advised and scheduled an appointment next week, she is almost over due for a cath change and will need to make sure EMS transportation can bring her, scheduled for 5/1 and will call back if needs to change this

## 2023-06-05 ENCOUNTER — Other Ambulatory Visit: Payer: Self-pay

## 2023-06-05 ENCOUNTER — Emergency Department
Admission: EM | Admit: 2023-06-05 | Discharge: 2023-06-05 | Disposition: A | Discharge status: Home / Self Care | Attending: Emergency Medicine | Admitting: Emergency Medicine

## 2023-06-05 DIAGNOSIS — C50919 Malignant neoplasm of unspecified site of unspecified female breast: Secondary | ICD-10-CM | POA: Diagnosis not present

## 2023-06-05 DIAGNOSIS — T83098A Other mechanical complication of other indwelling urethral catheter, initial encounter: Secondary | ICD-10-CM | POA: Insufficient documentation

## 2023-06-05 DIAGNOSIS — R6889 Other general symptoms and signs: Secondary | ICD-10-CM | POA: Diagnosis not present

## 2023-06-05 DIAGNOSIS — Z743 Need for continuous supervision: Secondary | ICD-10-CM | POA: Diagnosis not present

## 2023-06-05 DIAGNOSIS — R82998 Other abnormal findings in urine: Secondary | ICD-10-CM | POA: Diagnosis not present

## 2023-06-05 DIAGNOSIS — T83010A Breakdown (mechanical) of cystostomy catheter, initial encounter: Secondary | ICD-10-CM

## 2023-06-05 DIAGNOSIS — T83198A Other mechanical complication of other urinary devices and implants, initial encounter: Secondary | ICD-10-CM | POA: Diagnosis not present

## 2023-06-05 DIAGNOSIS — T83091A Other mechanical complication of indwelling urethral catheter, initial encounter: Secondary | ICD-10-CM | POA: Diagnosis not present

## 2023-06-05 LAB — URINALYSIS, W/ REFLEX TO CULTURE (INFECTION SUSPECTED)
Bilirubin Urine: NEGATIVE
Glucose, UA: NEGATIVE mg/dL
Hgb urine dipstick: NEGATIVE
Ketones, ur: NEGATIVE mg/dL
Nitrite: NEGATIVE
Protein, ur: >=300 mg/dL — AB
RBC / HPF: >50 RBC/hpf (ref 0–5)
Specific Gravity, Urine: 1.022 (ref 1.005–1.030)
WBC, UA: >50 WBC/hpf (ref 0–5)
pH: 8 (ref 5.0–8.0)

## 2023-06-05 NOTE — ED Provider Notes (Signed)
 [facility] Provider Note    Event Date/Time   First MD Initiated Contact with Patient 06/05/23 1637     (approximate)   History   No chief complaint on file.   HPI  Rebecca [name] is a 63 y.o. female past medical history significant for neurogenic bladder with suprapubic catheter placed, who presents to the emergency department for suprapubic catheter problems.  States that she has not been having output from her suprapubic catheter and instead has been urinating from her vagina which she has not done since her suprapubic catheter was placed.  States that she has had significant amount of sediment in the catheter.  She is due to be seen by urology this week.  States that she can usually tell if she has a urinary tract infection and that she does not feel like she is having a urinary tract infection at this time.  Denies fever, chills, nausea, vomiting.  States that she has not been flushing her suprapubic catheter and did not realize she was supposed to be doing that.  On chart review recent suprapubic catheter that was exchanged on 05/05/2023 and has a 16 [place]     Physical Exam   Triage Vital Signs: ED Triage Vitals  Encounter Vitals Group     BP --      Systolic BP Percentile --      Diastolic BP Percentile --      Pulse --      Resp --      Temp --      Temp src --      SpO2 06/05/23 1634 96 %     Weight 06/05/23 1638 223 lb (101.2 kg)     Height 06/05/23 1638 5\' 4"  (1.626 m)     Head Circumference --      Peak Flow --      Pain Score 06/05/23 1638 0     Pain Loc --      Pain Education --      Exclude from Growth Chart --     Most recent vital signs: Vitals:   06/05/23 2300 06/05/23 2320  BP: 134/62 139/66  Pulse: 69 73  Resp:    Temp:    SpO2: 100% 100%    Physical Exam Constitutional:      Appearance: She is well-developed.  HENT:     Head: Atraumatic.  Eyes:     Conjunctiva/sclera: Conjunctivae normal.  Cardiovascular:      Rate and Rhythm: Regular rhythm.  Pulmonary:     Effort: No respiratory distress.  Abdominal:     General: There is no distension.  Genitourinary:    Comments: Suprapubic catheter with sediment and no output.   Musculoskeletal:        General: Normal range of motion.     Cervical back: Normal range of motion.  Skin:    General: Skin is warm.  Neurological:     Mental Status: She is alert. Mental status is at baseline.     IMPRESSION / MDM / ASSESSMENT AND PLAN / ED COURSE  I reviewed the triage vital signs and the nursing notes.  Differential diagnosis including urinary tract infection, suprapubic catheter dysfunction  LABS (all labs ordered are listed, but only abnormal results are displayed) Labs interpreted as -    Labs Reviewed  URINALYSIS, W/ REFLEX TO CULTURE (INFECTION SUSPECTED) - Abnormal; Notable for the following components:      Result Value   Color,  Urine YELLOW (*)    APPearance TURBID (*)    Protein, ur >=300 (*)    Leukocytes,Ua MODERATE (*)    Bacteria, UA MANY (*)    All other components within normal limits  URINE CULTURE     MDM  Bladder ultrasound with no obvious urinary retention with Foley bulb in place from suprapubic catheter.  Clinical Course as of 06/06/23 0018  Mon Jun 05, 2023  2022 Discussed patient's case with urology Dr. Dulcy Gibney, recommended attempting gentle irrigation to see if suprapubic catheter could start flowing again.  If unable to get suprapubic catheter irrigated recommended placing a Foley catheter and setting a message to IR for suprapubic catheter exchange.  Did not recommend exchanging at this time given that it is a newly established catheter.  Did not feel that the patient would need admitted to the hospital.  Urine was sent for culture.  Not having any symptoms of UTI at this time. [SM]  2119 Discussed with Dr. Mabel Savage with interventional radiology.  He will schedule for outpatient suprapubic catheter exchange.  Will  place a Foley catheter while in the emergency department with plans to discharge home.  Her urine was sent for culture.  Will not discharge on antibiotics at this time. [SM]    Clinical Course User Index [SM] Viviano Ground, MD    Foley catheter in place.  Suprapubic catheter was capped.  Patient was transferred back home in stable condition with return precautions.  Will follow-up with urology.  IR should schedule outpatient exchange of her suprapubic catheter. PROCEDURES:  Critical Care performed: No  Procedures  Patient's presentation is most consistent with severe exacerbation of chronic illness.   MEDICATIONS ORDERED IN ED: Medications - No data to display  FINAL CLINICAL IMPRESSION(S) / ED DIAGNOSES   Final diagnoses:  Suprapubic catheter dysfunction, initial encounter [facility])     Rx / DC Orders   ED Discharge Orders     None        Note:  This document was prepared using Dragon voice recognition software and may include unintentional dictation errors.   Viviano Ground, MD 06/06/23 [phone]

## 2023-06-05 NOTE — ED Triage Notes (Signed)
 Pt has hx of MS. Has suprapubic catheter., and today hh nurse came to clean pt up a,d pt noticed that catheter output is cloudy and looks different. There is also fluid leaking from pt vagina. Pt believes that it is the same fluid that is going into the catheter. Suprapubic catheter was placed in February of 2025. Denies fever chills. No blood noted. Catheter appears to still be intact

## 2023-06-05 NOTE — ED Provider Notes (Incomplete)
 [facility] Provider Note    Event Date/Time   First MD Initiated Contact with Patient 06/05/23 1637     (approximate)   History   No chief complaint on file.   HPI  Rebecca [name] is a 63 y.o. female past medical history significant for neurogenic bladder with suprapubic catheter placed, who presents to the emergency department for suprapubic catheter problems.  States that she has not been having output from her suprapubic catheter and instead has been urinating from her vagina which she has not done since her suprapubic catheter was placed.  States that she has had significant amount of sediment in the catheter.  She is due to be seen by urology     Physical Exam   Triage Vital Signs: ED Triage Vitals  Encounter Vitals Group     BP --      Systolic BP Percentile --      Diastolic BP Percentile --      Pulse --      Resp --      Temp --      Temp src --      SpO2 06/05/23 1634 96 %     Weight 06/05/23 1638 223 lb (101.2 kg)     Height 06/05/23 1638 5\' 4"  (1.626 m)     Head Circumference --      Peak Flow --      Pain Score 06/05/23 1638 0     Pain Loc --      Pain Education --      Exclude from Growth Chart --     Most recent vital signs: Vitals:   06/05/23 1640 06/05/23 2100  BP: (!) 141/65 (!) 127/94  Pulse:  83  Resp: 18   Temp: 97.9 F (36.6 C)   SpO2: 96% 100%    Physical Exam  IMPRESSION / MDM / ASSESSMENT AND PLAN / ED COURSE  I reviewed the triage vital signs and the nursing notes.  *** EKG  I, Viviano Ground, the attending physician, personally viewed and interpreted this ECG.   Rate: Normal  Rhythm: Normal sinus  Axis: Normal  Intervals: Normal  ST&T Change: None  No tachycardic or bradycardic dysrhythmias while on cardiac telemetry.  RADIOLOGY I independently reviewed imaging, my interpretation of imaging: ***  LABS (all labs ordered are listed, but only abnormal results are displayed) Labs interpreted  as -    Labs Reviewed  URINALYSIS, W/ REFLEX TO CULTURE (INFECTION SUSPECTED) - Abnormal; Notable for the following components:      Result Value   Color, Urine YELLOW (*)    APPearance TURBID (*)    Protein, ur >=300 (*)    Leukocytes,Ua MODERATE (*)    Bacteria, UA MANY (*)    All other components within normal limits  URINE CULTURE     MDM   Clinical Course as of 06/05/23 2120  Mon Jun 05, 2023  2022 Discussed patient's case with urology Dr. Dulcy Gibney, recommended attempting gentle irrigation to see if suprapubic catheter could start flowing again.  If unable to get suprapubic catheter irrigated recommended placing a Foley catheter and setting a message to IR for suprapubic catheter exchange.  Did not recommend exchanging at this time given that it is a newly established catheter.  Did not feel that the patient would need admitted to the hospital.  Urine was sent for culture.  Not having any symptoms of UTI at this time. [SM]  2119 Discussed  with Dr. Mabel Savage with interventional radiology.  He will schedule for outpatient suprapubic catheter exchange.  Will place a Foley catheter while in the emergency department with plans to discharge home.  Her urine was sent for culture.  Will not discharge on antibiotics at this time. [SM]    Clinical Course User Index [SM] Viviano Ground, MD     PROCEDURES:  Critical Care performed: No  Procedures  Patient's presentation is most consistent with {EM COPA:27473}   MEDICATIONS ORDERED IN ED: Medications - No data to display  FINAL CLINICAL IMPRESSION(S) / ED DIAGNOSES   Final diagnoses:  None     Rx / DC Orders   ED Discharge Orders     None        Note:  This document was prepared using Dragon voice recognition software and may include unintentional dictation errors.

## 2023-06-05 NOTE — ED Notes (Signed)
 Small amount of blood noted in urine after insertion. Viviano Ground, MD notified and aware.

## 2023-06-05 NOTE — ED Notes (Signed)
 Called to [facility] @1019  Per RN [employer].

## 2023-06-05 NOTE — ED Notes (Signed)
 This nurse attempted to irrigate suprapubic catheter per Viviano Ground, MD request. Resistance met with irrigation attempt. Viviano Ground, MD notified and stated to cap off suprapubic catheter and insert a foley catheter.

## 2023-06-05 NOTE — ED Notes (Signed)
 Suprapubic catheter plugged off per Viviano Ground, MD request.

## 2023-06-05 NOTE — Discharge Instructions (Signed)
 You are seen in the emergency department for dysfunction of your suprapubic catheter.  We were unable to get your suprapubic catheter irrigated so a Foley catheter was placed.  You should receive a call from interventional radiology for outpatient replacement of your suprapubic catheter.  Call your urology team tomorrow.  The urine was sent for culture.  Return for any worsening or new symptoms.

## 2023-06-05 NOTE — ED Notes (Signed)
 [facility] preparing pt for transport home at this time.

## 2023-06-06 ENCOUNTER — Other Ambulatory Visit: Payer: Self-pay | Admitting: Interventional Radiology

## 2023-06-06 DIAGNOSIS — G894 Chronic pain syndrome: Secondary | ICD-10-CM | POA: Diagnosis not present

## 2023-06-06 DIAGNOSIS — N319 Neuromuscular dysfunction of bladder, unspecified: Secondary | ICD-10-CM

## 2023-06-06 DIAGNOSIS — E871 Hypo-osmolality and hyponatremia: Secondary | ICD-10-CM | POA: Diagnosis not present

## 2023-06-06 DIAGNOSIS — I890 Lymphedema, not elsewhere classified: Secondary | ICD-10-CM | POA: Diagnosis not present

## 2023-06-06 DIAGNOSIS — R1310 Dysphagia, unspecified: Secondary | ICD-10-CM | POA: Diagnosis not present

## 2023-06-06 DIAGNOSIS — K219 Gastro-esophageal reflux disease without esophagitis: Secondary | ICD-10-CM | POA: Diagnosis not present

## 2023-06-06 DIAGNOSIS — M4802 Spinal stenosis, cervical region: Secondary | ICD-10-CM | POA: Diagnosis not present

## 2023-06-06 DIAGNOSIS — N10 Acute tubulo-interstitial nephritis: Secondary | ICD-10-CM | POA: Diagnosis not present

## 2023-06-06 DIAGNOSIS — T83010A Breakdown (mechanical) of cystostomy catheter, initial encounter: Secondary | ICD-10-CM

## 2023-06-06 DIAGNOSIS — I5032 Chronic diastolic (congestive) heart failure: Secondary | ICD-10-CM | POA: Diagnosis not present

## 2023-06-06 DIAGNOSIS — G8220 Paraplegia, unspecified: Secondary | ICD-10-CM | POA: Diagnosis not present

## 2023-06-06 DIAGNOSIS — K8689 Other specified diseases of pancreas: Secondary | ICD-10-CM | POA: Diagnosis not present

## 2023-06-06 DIAGNOSIS — Z9049 Acquired absence of other specified parts of digestive tract: Secondary | ICD-10-CM | POA: Diagnosis not present

## 2023-06-06 DIAGNOSIS — D649 Anemia, unspecified: Secondary | ICD-10-CM | POA: Diagnosis not present

## 2023-06-06 DIAGNOSIS — K581 Irritable bowel syndrome with constipation: Secondary | ICD-10-CM | POA: Diagnosis not present

## 2023-06-06 DIAGNOSIS — I081 Rheumatic disorders of both mitral and tricuspid valves: Secondary | ICD-10-CM | POA: Diagnosis not present

## 2023-06-06 DIAGNOSIS — T83511A Infection and inflammatory reaction due to indwelling urethral catheter, initial encounter: Secondary | ICD-10-CM | POA: Diagnosis not present

## 2023-06-06 DIAGNOSIS — Z435 Encounter for attention to cystostomy: Secondary | ICD-10-CM | POA: Diagnosis not present

## 2023-06-06 DIAGNOSIS — Z853 Personal history of malignant neoplasm of breast: Secondary | ICD-10-CM | POA: Diagnosis not present

## 2023-06-06 DIAGNOSIS — H900 Conductive hearing loss, bilateral: Secondary | ICD-10-CM | POA: Diagnosis not present

## 2023-06-06 DIAGNOSIS — G35 Multiple sclerosis: Secondary | ICD-10-CM | POA: Diagnosis not present

## 2023-06-06 DIAGNOSIS — E876 Hypokalemia: Secondary | ICD-10-CM | POA: Diagnosis not present

## 2023-06-07 ENCOUNTER — Ambulatory Visit: Payer: 59 | Admitting: Physician Assistant

## 2023-06-07 ENCOUNTER — Encounter: Payer: Self-pay | Admitting: Family Medicine

## 2023-06-07 LAB — URINE CULTURE: Culture: >=100000 — AB

## 2023-06-08 ENCOUNTER — Ambulatory Visit: Admitting: Urology

## 2023-06-08 ENCOUNTER — Other Ambulatory Visit (HOSPITAL_COMMUNITY): Payer: Self-pay | Admitting: Student

## 2023-06-08 NOTE — H&P (Signed)
 Chief Complaint: Neurogenic bladder, suprapubic catheter complication - IR consulted for suprapubic catheter exchange today  Referring Provider(s): Vaillancourt,Samantha   Supervising Physician: Myrlene Asper  Patient Status: [facility] - Out-pt  History of Present Illness: Rebecca [name] is a 63 y.o. female with pmhx neurogenic bladder, seizures, IBS, GERD, breast cancer (status post chemo), and multiple sclerosis. Had suprapubic catheter initially placed 03/23/23 secondary to neurogenic bladder, and then exchanged/up-sized on 05/05/23. Pt went to the ED on 06/05/23 with suprapubic catheter complications - was having little to no output and large amount of visible sediment. ED attempted to flush the catheter with no success. New foley catheter was placed in the ED, and pt now referred back to IR for suprapubic catheter exchange today.    Patient is Full Code  Past Medical History:  Diagnosis Date   Allergy    Aortic valve disease    Mild AS / AI - most recent Echo demonstrated tricuspid aortic valve.   Bacterial endocarditis    History of .   Bilateral impacted cerumen 06/29/2021   Bilateral lower extremity edema    Noncardiac.  Chronic. LE Venous dopplers - negative for DVT.; Echocardiogram January 2016: Normal EF with normal wall motion and valve function. Only grade 1 diastolic dysfunction. EF 60-65%. Mild MR   Breast cancer (HCC) 12/31/2013   Right breast, 12:00, 1.5 cm, T1c,N0 invasive mammary carcinoma, triple negative. --> Rx with Chemo   Cervical stenosis of spine    GERD (gastroesophageal reflux disease) April or May 2022   Heart murmur 2013   Herpes zoster    IBS (irritable bowel syndrome)    Lymphedema    has legs wrapped at [facility]   Multiple sclerosis (HCC) 2001   Walks from room to room @ home; but Wheelchair when going out.   Neuromuscular disorder (HCC)    MS   PONV (postoperative nausea and vomiting)    Related to Fentanyl    Seizures (HCC)    Takes Keppra    SOB  (shortness of breath) 03/01/2013   Syncope and collapse    Urinary tract infection associated with indwelling urethral catheter (HCC) 02/17/2020    Past Surgical History:  Procedure Laterality Date   ANKLE SURGERY     Left   ANKLE SURGERY     ANTERIOR CERVICAL DECOMP/DISCECTOMY FUSION  11/17/2011   Procedure: ANTERIOR CERVICAL DECOMPRESSION/DISCECTOMY FUSION 2 LEVELS;  Surgeon: Manya Sells, MD;  Location: [facility];  Service: Neurosurgery;  Laterality: N/A;  Cervical Five-Six Six-Seven Anterior cervical decompression/diskectomy/fusion   BREAST BIOPSY Right 12/31/2013   invasive mammary   BREAST SURGERY Right 02/03/2014   Right simple mastectomy with sentinel node biopsy.   CHOLECYSTECTOMY     COLONOSCOPY  2014   ESOPHAGOGASTRODUODENOSCOPY (EGD) WITH PROPOFOL  N/A 07/16/2020   Procedure: ESOPHAGOGASTRODUODENOSCOPY (EGD) WITH PROPOFOL ;  Surgeon: Irby Mannan, MD;  Location: [facility] ENDOSCOPY;  Service: Endoscopy;  Laterality: N/A;  Patient has MS and will need assistance   HYSTEROSCOPY WITH D & C N/A 11/27/2018   Procedure: DILATATION AND CURETTAGE /HYSTEROSCOPY;  Surgeon: Alben Alma, MD;  Location: [facility] ORS;  Service: Gynecology;  Laterality: N/A;   IR CATHETER TUBE CHANGE  05/05/2023   IR CYSTOSTOMY TUBE PLACEMENT/BLADDER ASPIRATION  03/23/2023   Lower extremity venous Dopplers  02/27/2013   No LE DVT   MASTECTOMY Right 2015   ORIF TIBIA PLATEAU Left 12/09/2020   Procedure: LEFT OPEN REDUCTION INTERNAL FIXATION (ORIF) TIBIAL PLATEAU;  Surgeon: Laneta Pintos, MD;  Location: [facility]  OR;  Service: Orthopedics;  Laterality: Left;   Port a cath insertion Right 01/19/2010   PORT-A-CATH REMOVAL     right   PORT-A-CATH REMOVAL Right 09/03/2013   Procedure: REMOVAL PORT-A-CATH;  Surgeon: Arvil Lauber, MD;  Location: [facility] OR;  Service: Vascular;  Laterality: Right;   SPINE SURGERY  Cervical steinois Dr. Nigel Bart 2014 I think?   TONSILLECTOMY     TRANSTHORACIC ECHOCARDIOGRAM  03/2013;  02/2014   a) Normal LV size and function with EF 60-65%.; Cannot exclude bicuspid aortic valve with mild AS and mild AI.; b) Normal EF with normal wall motion and valve function x Mild MR. G2 DD. EF 60-65%. Tricuspid AoV   UPPER GI ENDOSCOPY  2014    Allergies: Fentanyl  and Sulfa antibiotics  Medications: Prior to Admission medications   Medication Sig Start Date End Date Taking? Authorizing Provider  amantadine  (SYMMETREL ) 100 MG capsule TAKE 1 CAPSULE BY MOUTH THREE TIMES A DAY 04/17/23   Penumalli, Vikram R, MD  baclofen  (LIORESAL ) 10 MG tablet Take 1-2 tablets (10-20 mg total) by mouth 3 (three) times daily. 08/15/22   Penumalli, Vikram R, MD  gluconic acid-citric acid (RENACIDIN ) irrigation Insert 30 mL into suprapubic tube and clamp tube for 10 minutes and then drain twice daily 09/09/22   McGowan, Cathleen Coach A, PA-C  Glucosamine-Chondroitin (COSAMIN DS PO) Take 1 tablet by mouth 3 (three) times daily.    [provider]  levETIRAcetam  (KEPPRA ) 500 MG tablet Take 1 tablet (500 mg total) by mouth 2 (two) times daily. 08/15/22   Penumalli, Vikram R, MD  lipase/protease/amylase (CREON ) 36000 UNITS CPEP capsule Take 2 capsules (72,000 Units total) by mouth 3 (three) times daily with meals AND 1 capsule (36,000 Units total) with snacks. Patient taking differently: Take 1-2 capsules (36,000- 72,000 Units total) by mouth with (three) times daily with meals AND 1 capsule (36,000 Units total) with snacks. (1 capsule by mouth with breakfast, 2 capsules by mouth with lunch and 1 capsule by mouth with dinner)   08/25/22 08/20/23  Brigitte Canard, PA-C  loratadine  (CLARITIN ) 10 MG tablet Take 10 mg by mouth daily.    [provider]  Misc Natural Products (LEG VEIN & CIRCULATION) TABS Take 1 tablet by mouth 2 (two) times daily.     [provider]  Multiple Vitamins-Minerals (MULTIVITAMIN PO) Take 1 tablet by mouth daily.     [provider]  nystatin  (MYCOSTATIN /NYSTOP ) powder  Apply 1 application topically 3 (three) times daily as needed. For rash/raw skin as needed 07/31/19   Tapia, Leisa, PA-C  omeprazole  (PRILOSEC) 40 MG capsule TAKE 1 CAPSULE (40 MG TOTAL) BY MOUTH DAILY. 01/16/23   Tapia, Leisa, PA-C  oxybutynin  (DITROPAN -XL) 10 MG 24 hr tablet Take 1 tablet (10 mg total) by mouth 2 (two) times daily. 01/16/23   McGowan, Cathleen Coach A, PA-C  REBIF  REBIDOSE 44 MCG/0.5ML SOAJ Inject 0.5 mLs into the skin 3 (three) times a week. 04/24/23 07/23/23  Penumalli, Brenton Cambridge, MD  tetracycline  (SUMYCIN ) 500 MG capsule Take 1 capsule (500 mg total) by mouth 2 (two) times daily. 05/02/23   Matilde Son A, PA-C  tiZANidine  (ZANAFLEX ) 4 MG tablet Take 1 tablet (4 mg total) by mouth at bedtime. 08/15/22   Penumalli, Vikram R, MD  Turmeric 500 MG CAPS Take 500 mg by mouth daily.    [provider]     Family History  Problem Relation Age of Onset   Cancer Father  skin   Heart disease Father    Heart attack Father        heart attack in his 70's   Thyroid  disease Sister    Ovarian cancer Cousin    Breast cancer Maternal Aunt 25   Breast cancer Maternal Grandmother 53   Bladder Cancer Neg Hx    Kidney cancer Neg Hx     Social History   Socioeconomic History   Marital status: Married    Spouse name: don   Number of children: 0   Years of education: 12   Highest education level: 12th grade  Occupational History   Occupation: disability  Tobacco Use   Smoking status: Never   Smokeless tobacco: Never   Tobacco comments:    Never smoked. My mom smoked. Passed 04/18/20. Dad smoked, quit 1973. Sister smokes.  Vaping Use   Vaping status: Never Used  Substance and Sexual Activity   Alcohol use: No   Drug use: No   Sexual activity: Not Currently    Partners: Male    Birth control/protection: None  Other Topics Concern   Not on file  Social History Narrative   She is married. Recently moved back to [place]  after being in [place]  for some time. She  is accompanied by her husband and aunt.Never smoked. Never used alcohol.      1/29: husband currently in SNF in [place]. Aunt lives with her but unable to help care for her. Home health comes in once daily to change her. She does not get out of her recliner per patient   Social Drivers of Health   Financial Resource Strain: Low Risk  (12/03/2022)   Overall Financial Resource Strain (CARDIA)    Difficulty of Paying Living Expenses: Not hard at all  Food Insecurity: No Food Insecurity (04/04/2023)   Hunger Vital Sign    Worried About Running Out of Food in the Last Year: Never true    Ran Out of Food in the Last Year: Never true  Transportation Needs: Unmet Transportation Needs (04/04/2023)   PRAPARE - Transportation    Lack of Transportation (Medical): Yes    Lack of Transportation (Non-Medical): Yes  Physical Activity: Inactive (12/03/2022)   Exercise Vital Sign    Days of Exercise per Week: 0 days    Minutes of Exercise per Session: 30 min  Stress: No Stress Concern Present (12/03/2022)   [employer] of Occupational Health - Occupational Stress Questionnaire    Feeling of Stress : Not at all  Social Connections: Moderately Isolated (03/14/2023)   Social Connection and Isolation Panel [NHANES]    Frequency of Communication with Friends and Family: More than three times a week    Frequency of Social Gatherings with Friends and Family: More than three times a week    Attends Religious Services: Never    Database administrator or Organizations: No    Attends Banker Meetings: Never    Marital Status: Married     Review of Systems: A 12 point ROS discussed and pertinent positives are indicated in the HPI above.  All other systems are negative.    Vital Signs: BP (!) 166/83   Pulse 87   Temp 98.5 F (36.9 C) (Oral)   Resp (!) 26   Ht 5\' 4"  (1.626 m)   Wt 223 lb (101.2 kg)   SpO2 97%   BMI 38.28 kg/m   Advance Care Plan: The advanced care  place/surrogate decision maker was discussed at the  time of visit and the patient did not wish to discuss or was not able to name a surrogate decision maker or provide an advance care plan.  Physical Exam Vitals and nursing note reviewed.  Constitutional:      Appearance: Normal appearance.  HENT:     Mouth/Throat:     Mouth: Mucous membranes are moist.     Pharynx: Oropharynx is clear.  Cardiovascular:     Rate and Rhythm: Normal rate and regular rhythm.  Pulmonary:     Effort: Pulmonary effort is normal.     Breath sounds: Normal breath sounds.  Abdominal:     Palpations: Abdomen is soft.     Tenderness: There is no abdominal tenderness.     Comments: + SP cath noted. No surrounding external abnormality, no pain  Genitourinary:    Comments: + foley cath  Musculoskeletal:     Right lower leg: Edema (chronic lymphedema) present.     Left lower leg: Edema (chronic lymphedema) present.  Skin:    General: Skin is warm and dry.  Neurological:     Mental Status: She is alert and oriented to person, place, and time. Mental status is at baseline.     Imaging: No results found.  Labs:  CBC: Recent Labs    03/25/23 0600 03/26/23 0424 03/29/23 0550 03/30/23 0938 03/31/23 0546  WBC 12.7* 7.6 3.3*  --  2.8*  HGB 10.6* 10.8* 10.1* 9.7* 9.3*  HCT 32.1* 32.0* 30.0*  --  27.6*  PLT 116* 124* 158  --  184    COAGS: Recent Labs    03/08/23 1514 03/23/23 1153 03/24/23 1317  INR 1.2 1.0 1.2  APTT  --   --  37*    BMP: Recent Labs    03/12/23 0438 03/24/23 1317 03/25/23 0600 03/26/23 0424  NA 136 134* 133* 136  K 3.7 3.5 3.4* 3.6  CL 104 100 105 103  CO2 24 23 21* 23  GLUCOSE 87 151* 117* 102*  BUN 10 14 14 11   CALCIUM  8.4* 8.9 8.5* 8.6*  CREATININE 0.39* 0.57 0.77 0.54  GFRNONAA >60 >60 >60 >60    LIVER FUNCTION TESTS: Recent Labs    12/07/22 1117 03/08/23 1514 03/09/23 0557 03/24/23 1317  BILITOT 0.5 1.3* 0.8 1.0  AST 21 45* 76* 30  ALT 23 45* 61*  32  ALKPHOS  --  84 78 95  PROT 7.9 7.6 7.5 7.5  ALBUMIN  --  3.2* 2.9* 3.1*    TUMOR MARKERS: No results for input(s): "AFPTM", "CEA", "CA199", "CHROMGRNA" in the last 8760 hours.  Assessment and Plan:  YVON PARENTI is a 63 y.o. female with pmhx neurogenic bladder, seizures, IBS, GERD, breast cancer (status post chemo), and multiple sclerosis. Had suprapubic catheter initially placed 03/23/23 secondary to neurogenic bladder, and then exchanged/up-sized on 05/05/23. Pt went to the ED on 06/05/23 with suprapubic catheter complications - was having little to no output and large amount of visible sediment. ED attempted to flush the catheter with no success. New foley catheter was placed in the ED, and pt now referred back to IR for suprapubic catheter exchange today.   Risks and benefits of suprapubic catheter exchange were discussed with the patient including bleeding, infection, damage to adjacent structures, bladder perforation/fistula connection, and sepsis.  All of the patient's questions were answered, patient is agreeable to proceed. Consent signed and in chart.   Thank you for allowing our service to participate in Rebecca [name] 's care.  Electronically Signed: Nicolasa Barrett, PA-C   06/09/2023, 12:42 PM      I spent a total of    25 Minutes in face to face in clinical consultation, greater than 50% of which was counseling/coordinating care for suprapubic catheter exchange.

## 2023-06-08 NOTE — Progress Notes (Signed)
 Patient for IR SPT repair/exchange on Friday 06/09/23, I called and spoke with the patient on the phone and gave pre-procedure instructions. Pt was made aware to be here at 10a, NPO after MN prior to procedure as well as driver post procedure/recovery/discharge. Pt stated understanding.  Called 06/07/23

## 2023-06-09 ENCOUNTER — Other Ambulatory Visit: Payer: Self-pay

## 2023-06-09 ENCOUNTER — Encounter: Payer: Self-pay | Admitting: Radiology

## 2023-06-09 ENCOUNTER — Ambulatory Visit
Admission: RE | Admit: 2023-06-09 | Discharge: 2023-06-09 | Disposition: A | Discharge status: Home / Self Care | Source: Ambulatory Visit | Attending: Interventional Radiology | Admitting: Interventional Radiology

## 2023-06-09 DIAGNOSIS — Z853 Personal history of malignant neoplasm of breast: Secondary | ICD-10-CM | POA: Diagnosis not present

## 2023-06-09 DIAGNOSIS — N319 Neuromuscular dysfunction of bladder, unspecified: Secondary | ICD-10-CM | POA: Diagnosis not present

## 2023-06-09 DIAGNOSIS — K589 Irritable bowel syndrome without diarrhea: Secondary | ICD-10-CM | POA: Diagnosis not present

## 2023-06-09 DIAGNOSIS — R609 Edema, unspecified: Secondary | ICD-10-CM | POA: Diagnosis not present

## 2023-06-09 DIAGNOSIS — Z9221 Personal history of antineoplastic chemotherapy: Secondary | ICD-10-CM | POA: Diagnosis not present

## 2023-06-09 DIAGNOSIS — Z7401 Bed confinement status: Secondary | ICD-10-CM | POA: Diagnosis not present

## 2023-06-09 DIAGNOSIS — G35 Multiple sclerosis: Secondary | ICD-10-CM | POA: Diagnosis not present

## 2023-06-09 DIAGNOSIS — T83010A Breakdown (mechanical) of cystostomy catheter, initial encounter: Secondary | ICD-10-CM | POA: Insufficient documentation

## 2023-06-09 DIAGNOSIS — T83090A Other mechanical complication of cystostomy catheter, initial encounter: Secondary | ICD-10-CM | POA: Diagnosis not present

## 2023-06-09 DIAGNOSIS — K219 Gastro-esophageal reflux disease without esophagitis: Secondary | ICD-10-CM | POA: Insufficient documentation

## 2023-06-09 DIAGNOSIS — Z743 Need for continuous supervision: Secondary | ICD-10-CM | POA: Diagnosis not present

## 2023-06-09 DIAGNOSIS — Y731 Therapeutic (nonsurgical) and rehabilitative gastroenterology and urology devices associated with adverse incidents: Secondary | ICD-10-CM | POA: Insufficient documentation

## 2023-06-09 HISTORY — PX: IR CATHETER TUBE CHANGE: IMG717

## 2023-06-09 HISTORY — PX: IR CYSTOSTOMY TUBE CHANGE COMPLICATED W IMG: IMG1098

## 2023-06-09 MED ORDER — LIDOCAINE HCL 1 % IJ SOLN: 20.0000 mL | Freq: Once | INTRAMUSCULAR | Status: DC

## 2023-06-09 MED ORDER — LIDOCAINE VISCOUS HCL 2 % MT SOLN
OROMUCOSAL | Status: AC
  Filled 2023-06-09: qty 15

## 2023-06-09 MED ORDER — SODIUM CHLORIDE 0.9 % IV SOLN: INTRAVENOUS | Status: DC

## 2023-06-09 MED ORDER — LIDOCAINE VISCOUS HCL 2 % MT SOLN
15.0000 mL | Freq: Once | OROMUCOSAL | Status: AC
  Administered 2023-06-09: 15 mL via OROMUCOSAL

## 2023-06-09 MED ORDER — HEPARIN SOD (PORK) LOCK FLUSH 100 UNIT/ML IV SOLN
500.0000 [IU] | Freq: Once | INTRAVENOUS | Status: AC
  Administered 2023-06-09: 500 [IU] via INTRAVENOUS

## 2023-06-09 MED ORDER — FENTANYL CITRATE (PF) 100 MCG/2ML IJ SOLN
INTRAMUSCULAR | Status: AC
  Filled 2023-06-09: qty 2

## 2023-06-09 MED ORDER — LIDOCAINE HCL 1 % IJ SOLN
INTRAMUSCULAR | Status: AC
  Filled 2023-06-09: qty 20

## 2023-06-09 MED ORDER — SODIUM CHLORIDE 0.9% FLUSH: 10.0000 mL | INTRAVENOUS | Status: DC | PRN

## 2023-06-09 MED ORDER — IOHEXOL 300 MG/ML  SOLN
50.0000 mL | Freq: Once | INTRAMUSCULAR | Status: AC | PRN
Start: 2023-06-09 — End: 2023-06-09
  Administered 2023-06-09: 15 mL

## 2023-06-09 MED ORDER — FENTANYL CITRATE (PF) 100 MCG/2ML IJ SOLN
INTRAMUSCULAR | Status: AC | PRN
  Administered 2023-06-09: 50 ug via INTRAVENOUS

## 2023-06-09 MED ORDER — MIDAZOLAM HCL 2 MG/2ML IJ SOLN
INTRAMUSCULAR | Status: AC | PRN
  Administered 2023-06-09: 1 mg via INTRAVENOUS

## 2023-06-09 MED ORDER — HEPARIN SOD (PORK) LOCK FLUSH 100 UNIT/ML IV SOLN
INTRAVENOUS | Status: AC
  Filled 2023-06-09: qty 5

## 2023-06-09 MED ORDER — MIDAZOLAM HCL 2 MG/2ML IJ SOLN
INTRAMUSCULAR | Status: AC
  Filled 2023-06-09: qty 2

## 2023-06-09 NOTE — Progress Notes (Signed)
 Patient clinically stable post IR SPT change, tolerated well. Pink tinged urine draining into newly placed SPT , received Versed  1 mg along with Fentanyl  50 mcg IV for procedure. Denies complaints post procedure. Report given to Wendelin Halsted RN post procedure/specials/9

## 2023-06-09 NOTE — Procedures (Addendum)
 Interventional Radiology Procedure Note  Procedure: Image guided exchange of obstructed supra-pubic catheter.   New 16 F suprapubic council tip.   Findings:  The in dwelling is blocked by urinary debris/encrustation.   Complications: None  EBL: None    Recommendations: - Routine care, with sterile flushes at least once daily - Adequate hydration to dilute urine -1 hr dc home -advance diet - ok to restart home meds - remove urethral catheter now   Signed,  Rebecca [name]. Rebecca Savage, DO

## 2023-06-12 ENCOUNTER — Other Ambulatory Visit: Payer: Self-pay | Admitting: Interventional Radiology

## 2023-06-12 DIAGNOSIS — G894 Chronic pain syndrome: Secondary | ICD-10-CM | POA: Diagnosis not present

## 2023-06-12 DIAGNOSIS — K219 Gastro-esophageal reflux disease without esophagitis: Secondary | ICD-10-CM | POA: Diagnosis not present

## 2023-06-12 DIAGNOSIS — G35 Multiple sclerosis: Secondary | ICD-10-CM | POA: Diagnosis not present

## 2023-06-12 DIAGNOSIS — N319 Neuromuscular dysfunction of bladder, unspecified: Secondary | ICD-10-CM | POA: Diagnosis not present

## 2023-06-12 DIAGNOSIS — G8220 Paraplegia, unspecified: Secondary | ICD-10-CM | POA: Diagnosis not present

## 2023-06-12 DIAGNOSIS — D649 Anemia, unspecified: Secondary | ICD-10-CM | POA: Diagnosis not present

## 2023-06-12 DIAGNOSIS — K8689 Other specified diseases of pancreas: Secondary | ICD-10-CM | POA: Diagnosis not present

## 2023-06-12 DIAGNOSIS — Z853 Personal history of malignant neoplasm of breast: Secondary | ICD-10-CM | POA: Diagnosis not present

## 2023-06-12 DIAGNOSIS — Z435 Encounter for attention to cystostomy: Secondary | ICD-10-CM | POA: Diagnosis not present

## 2023-06-12 DIAGNOSIS — N10 Acute tubulo-interstitial nephritis: Secondary | ICD-10-CM | POA: Diagnosis not present

## 2023-06-12 DIAGNOSIS — T83511A Infection and inflammatory reaction due to indwelling urethral catheter, initial encounter: Secondary | ICD-10-CM | POA: Diagnosis not present

## 2023-06-12 DIAGNOSIS — I890 Lymphedema, not elsewhere classified: Secondary | ICD-10-CM | POA: Diagnosis not present

## 2023-06-12 DIAGNOSIS — K581 Irritable bowel syndrome with constipation: Secondary | ICD-10-CM | POA: Diagnosis not present

## 2023-06-12 DIAGNOSIS — R1310 Dysphagia, unspecified: Secondary | ICD-10-CM | POA: Diagnosis not present

## 2023-06-12 DIAGNOSIS — H900 Conductive hearing loss, bilateral: Secondary | ICD-10-CM | POA: Diagnosis not present

## 2023-06-12 DIAGNOSIS — E871 Hypo-osmolality and hyponatremia: Secondary | ICD-10-CM | POA: Diagnosis not present

## 2023-06-12 DIAGNOSIS — E876 Hypokalemia: Secondary | ICD-10-CM | POA: Diagnosis not present

## 2023-06-12 DIAGNOSIS — I081 Rheumatic disorders of both mitral and tricuspid valves: Secondary | ICD-10-CM | POA: Diagnosis not present

## 2023-06-12 DIAGNOSIS — T83010A Breakdown (mechanical) of cystostomy catheter, initial encounter: Secondary | ICD-10-CM

## 2023-06-12 DIAGNOSIS — Z9049 Acquired absence of other specified parts of digestive tract: Secondary | ICD-10-CM | POA: Diagnosis not present

## 2023-06-12 DIAGNOSIS — I5032 Chronic diastolic (congestive) heart failure: Secondary | ICD-10-CM | POA: Diagnosis not present

## 2023-06-12 DIAGNOSIS — M4802 Spinal stenosis, cervical region: Secondary | ICD-10-CM | POA: Diagnosis not present

## 2023-06-16 DIAGNOSIS — E871 Hypo-osmolality and hyponatremia: Secondary | ICD-10-CM | POA: Diagnosis not present

## 2023-06-16 DIAGNOSIS — G894 Chronic pain syndrome: Secondary | ICD-10-CM | POA: Diagnosis not present

## 2023-06-16 DIAGNOSIS — K8689 Other specified diseases of pancreas: Secondary | ICD-10-CM | POA: Diagnosis not present

## 2023-06-16 DIAGNOSIS — T83511A Infection and inflammatory reaction due to indwelling urethral catheter, initial encounter: Secondary | ICD-10-CM | POA: Diagnosis not present

## 2023-06-16 DIAGNOSIS — I081 Rheumatic disorders of both mitral and tricuspid valves: Secondary | ICD-10-CM | POA: Diagnosis not present

## 2023-06-16 DIAGNOSIS — N319 Neuromuscular dysfunction of bladder, unspecified: Secondary | ICD-10-CM | POA: Diagnosis not present

## 2023-06-16 DIAGNOSIS — I5032 Chronic diastolic (congestive) heart failure: Secondary | ICD-10-CM | POA: Diagnosis not present

## 2023-06-16 DIAGNOSIS — M4802 Spinal stenosis, cervical region: Secondary | ICD-10-CM | POA: Diagnosis not present

## 2023-06-16 DIAGNOSIS — H900 Conductive hearing loss, bilateral: Secondary | ICD-10-CM | POA: Diagnosis not present

## 2023-06-16 DIAGNOSIS — Z435 Encounter for attention to cystostomy: Secondary | ICD-10-CM | POA: Diagnosis not present

## 2023-06-16 DIAGNOSIS — Z9049 Acquired absence of other specified parts of digestive tract: Secondary | ICD-10-CM | POA: Diagnosis not present

## 2023-06-16 DIAGNOSIS — D649 Anemia, unspecified: Secondary | ICD-10-CM | POA: Diagnosis not present

## 2023-06-16 DIAGNOSIS — K581 Irritable bowel syndrome with constipation: Secondary | ICD-10-CM | POA: Diagnosis not present

## 2023-06-16 DIAGNOSIS — K219 Gastro-esophageal reflux disease without esophagitis: Secondary | ICD-10-CM | POA: Diagnosis not present

## 2023-06-16 DIAGNOSIS — E876 Hypokalemia: Secondary | ICD-10-CM | POA: Diagnosis not present

## 2023-06-16 DIAGNOSIS — I890 Lymphedema, not elsewhere classified: Secondary | ICD-10-CM | POA: Diagnosis not present

## 2023-06-16 DIAGNOSIS — R1310 Dysphagia, unspecified: Secondary | ICD-10-CM | POA: Diagnosis not present

## 2023-06-16 DIAGNOSIS — Z853 Personal history of malignant neoplasm of breast: Secondary | ICD-10-CM | POA: Diagnosis not present

## 2023-06-16 DIAGNOSIS — N10 Acute tubulo-interstitial nephritis: Secondary | ICD-10-CM | POA: Diagnosis not present

## 2023-06-16 DIAGNOSIS — G8220 Paraplegia, unspecified: Secondary | ICD-10-CM | POA: Diagnosis not present

## 2023-06-16 DIAGNOSIS — G35 Multiple sclerosis: Secondary | ICD-10-CM | POA: Diagnosis not present

## 2023-06-19 ENCOUNTER — Other Ambulatory Visit: Payer: Self-pay | Admitting: Urology

## 2023-06-19 DIAGNOSIS — R8290 Unspecified abnormal findings in urine: Secondary | ICD-10-CM

## 2023-06-19 DIAGNOSIS — N319 Neuromuscular dysfunction of bladder, unspecified: Secondary | ICD-10-CM

## 2023-06-19 NOTE — Progress Notes (Unsigned)
 I have spoken to Mrs. Reali regarding her suprapubic issues.  We are going to order a CT scan to evaluate for possible bladder stone.  She has been irrigating her SPT with Renacidin  daily.  She will need to be able to irrigate with saline daily as well.  Would you call [phone] ask for the nurse to see how we can get her sterile saline for irrigation and irrigation supplies?

## 2023-06-20 ENCOUNTER — Encounter: Payer: Self-pay | Admitting: Diagnostic Neuroimaging

## 2023-06-21 ENCOUNTER — Encounter: Payer: Self-pay | Admitting: Family Medicine

## 2023-06-21 ENCOUNTER — Encounter: Payer: Self-pay | Admitting: *Deleted

## 2023-06-23 DIAGNOSIS — R1310 Dysphagia, unspecified: Secondary | ICD-10-CM | POA: Diagnosis not present

## 2023-06-23 DIAGNOSIS — G894 Chronic pain syndrome: Secondary | ICD-10-CM | POA: Diagnosis not present

## 2023-06-23 DIAGNOSIS — E876 Hypokalemia: Secondary | ICD-10-CM | POA: Diagnosis not present

## 2023-06-23 DIAGNOSIS — G35 Multiple sclerosis: Secondary | ICD-10-CM | POA: Diagnosis not present

## 2023-06-23 DIAGNOSIS — I081 Rheumatic disorders of both mitral and tricuspid valves: Secondary | ICD-10-CM | POA: Diagnosis not present

## 2023-06-23 DIAGNOSIS — K219 Gastro-esophageal reflux disease without esophagitis: Secondary | ICD-10-CM | POA: Diagnosis not present

## 2023-06-23 DIAGNOSIS — Z435 Encounter for attention to cystostomy: Secondary | ICD-10-CM | POA: Diagnosis not present

## 2023-06-23 DIAGNOSIS — H900 Conductive hearing loss, bilateral: Secondary | ICD-10-CM | POA: Diagnosis not present

## 2023-06-23 DIAGNOSIS — G8220 Paraplegia, unspecified: Secondary | ICD-10-CM | POA: Diagnosis not present

## 2023-06-23 DIAGNOSIS — M4802 Spinal stenosis, cervical region: Secondary | ICD-10-CM | POA: Diagnosis not present

## 2023-06-23 DIAGNOSIS — E871 Hypo-osmolality and hyponatremia: Secondary | ICD-10-CM | POA: Diagnosis not present

## 2023-06-23 DIAGNOSIS — D649 Anemia, unspecified: Secondary | ICD-10-CM | POA: Diagnosis not present

## 2023-06-23 DIAGNOSIS — I5032 Chronic diastolic (congestive) heart failure: Secondary | ICD-10-CM | POA: Diagnosis not present

## 2023-06-23 DIAGNOSIS — K581 Irritable bowel syndrome with constipation: Secondary | ICD-10-CM | POA: Diagnosis not present

## 2023-06-23 DIAGNOSIS — K8689 Other specified diseases of pancreas: Secondary | ICD-10-CM | POA: Diagnosis not present

## 2023-06-23 DIAGNOSIS — N10 Acute tubulo-interstitial nephritis: Secondary | ICD-10-CM | POA: Diagnosis not present

## 2023-06-23 DIAGNOSIS — Z9049 Acquired absence of other specified parts of digestive tract: Secondary | ICD-10-CM | POA: Diagnosis not present

## 2023-06-23 DIAGNOSIS — T83511A Infection and inflammatory reaction due to indwelling urethral catheter, initial encounter: Secondary | ICD-10-CM | POA: Diagnosis not present

## 2023-06-23 DIAGNOSIS — I890 Lymphedema, not elsewhere classified: Secondary | ICD-10-CM | POA: Diagnosis not present

## 2023-06-23 DIAGNOSIS — N319 Neuromuscular dysfunction of bladder, unspecified: Secondary | ICD-10-CM | POA: Diagnosis not present

## 2023-06-23 DIAGNOSIS — Z853 Personal history of malignant neoplasm of breast: Secondary | ICD-10-CM | POA: Diagnosis not present

## 2023-06-26 ENCOUNTER — Other Ambulatory Visit: Payer: Self-pay

## 2023-06-26 ENCOUNTER — Emergency Department
Admission: EM | Admit: 2023-06-26 | Discharge: 2023-06-27 | Disposition: A | Discharge status: Home / Self Care | Attending: Emergency Medicine | Admitting: Emergency Medicine

## 2023-06-26 ENCOUNTER — Telehealth: Payer: Self-pay

## 2023-06-26 DIAGNOSIS — N3000 Acute cystitis without hematuria: Secondary | ICD-10-CM | POA: Insufficient documentation

## 2023-06-26 DIAGNOSIS — Y828 Other medical devices associated with adverse incidents: Secondary | ICD-10-CM | POA: Insufficient documentation

## 2023-06-26 DIAGNOSIS — R6889 Other general symptoms and signs: Secondary | ICD-10-CM | POA: Diagnosis not present

## 2023-06-26 DIAGNOSIS — T83098A Other mechanical complication of other indwelling urethral catheter, initial encounter: Secondary | ICD-10-CM | POA: Diagnosis not present

## 2023-06-26 DIAGNOSIS — T83091A Other mechanical complication of indwelling urethral catheter, initial encounter: Secondary | ICD-10-CM | POA: Diagnosis not present

## 2023-06-26 DIAGNOSIS — T83010A Breakdown (mechanical) of cystostomy catheter, initial encounter: Secondary | ICD-10-CM

## 2023-06-26 LAB — URINALYSIS, W/ REFLEX TO CULTURE (INFECTION SUSPECTED)
Bilirubin Urine: NEGATIVE
Glucose, UA: NEGATIVE mg/dL
Hgb urine dipstick: NEGATIVE
Ketones, ur: NEGATIVE mg/dL
Nitrite: POSITIVE — AB
Protein, ur: 100 mg/dL — AB
Specific Gravity, Urine: 1.02 (ref 1.005–1.030)
WBC, UA: >50 WBC/hpf (ref 0–5)
pH: 7 (ref 5.0–8.0)

## 2023-06-26 MED ORDER — CEFTRIAXONE SODIUM 1 G IJ SOLR
1.0000 g | Freq: Once | INTRAMUSCULAR | Status: AC
  Administered 2023-06-26: 1 g via INTRAMUSCULAR
  Filled 2023-06-26: qty 10

## 2023-06-26 MED ORDER — CEPHALEXIN 500 MG PO CAPS: 500.0000 mg | ORAL_CAPSULE | Freq: Two times a day (BID) | ORAL | 0 refills | Status: AC

## 2023-06-26 NOTE — ED Notes (Signed)
 Attempted to flush suprapubic cath, resistance met and unable to flush. Mumma, MD notified.

## 2023-06-26 NOTE — Telephone Encounter (Signed)
 Pt calls triage line and states that she has an SPT that became clogged, this prompted her to go to the ED were her SPT was plugged and a foley catheter was placed. She now c/o urinary leakage from around the foley catheter. She states he diaper is soaked. She denies pain, fever or chills. She called the IR team who advised her to contact us . Please advise on next steps. Patient has to be transported by EMS.

## 2023-06-26 NOTE — ED Triage Notes (Signed)
 Patient C/O suprapubic catheter failure. Patient states that she has MS and is incontinent, which is the reason for her catheter, and has been urinating from her urethra instead of her suprapubic catheter since 0900 today. She states that the urine in her catheter bag is from yesterday.

## 2023-06-26 NOTE — ED Provider Notes (Signed)
 [facility] Provider Note    None    (approximate)   History   Urinary Catheter Problem   HPI  Rebecca [name] is a 63 y.o. female past medical history significant for multiple sclerosis with suprapubic catheter in place who presents to the emergency department for nondraining suprapubic catheter.  Patient is having urine draining out of her urethra.  States that it has been constant.  Denies fever or chills.  Recent had her suprapubic catheter with a guidewire and exchanged for a 65 [place] with outpatient IR.  States that she called earlier today and was called and told to talk to her urologist.  States that she called her urologist but did not hear back from them which is why she presented to the emergency department.     Physical Exam   Triage Vital Signs: ED Triage Vitals  Encounter Vitals Group     BP 06/26/23 2145 (!) 161/102     Systolic BP Percentile --      Diastolic BP Percentile --      Pulse Rate 06/26/23 2145 (!) 102     Resp 06/26/23 2145 18     Temp 06/26/23 2145 98.9 F (37.2 C)     Temp Source 06/26/23 2145 Oral     SpO2 06/26/23 2145 97 %     Weight 06/26/23 2149 205 lb 7.5 oz (93.2 kg)     Height 06/26/23 2149 5\' 4"  (1.626 m)     Head Circumference --      Peak Flow --      Pain Score 06/26/23 2148 0     Pain Loc --      Pain Education --      Exclude from Growth Chart --     Most recent vital signs: Vitals:   06/26/23 2145  BP: (!) 161/102  Pulse: (!) 102  Resp: 18  Temp: 98.9 F (37.2 C)  SpO2: 97%    Physical Exam Constitutional:      Appearance: She is well-developed.  HENT:     Head: Atraumatic.  Eyes:     Conjunctiva/sclera: Conjunctivae normal.  Cardiovascular:     Rate and Rhythm: Regular rhythm.  Pulmonary:     Effort: No respiratory distress.  Abdominal:     General: There is no distension.  Genitourinary:    Comments: Suprapubic catheter in place, 72 French Musculoskeletal:        General:  Normal range of motion.     Cervical back: Normal range of motion.  Skin:    General: Skin is warm.  Neurological:     Mental Status: She is alert. Mental status is at baseline.      IMPRESSION / MDM / ASSESSMENT AND PLAN / ED COURSE  I reviewed the triage vital signs and the nursing notes.  Differential diagnosis including clogged suprapubic catheter, urinary tract infection  Labs (all labs ordered are listed, but only abnormal results are displayed) Labs interpreted as -    Labs Reviewed  URINALYSIS, W/ REFLEX TO CULTURE (INFECTION SUSPECTED) - Abnormal; Notable for the following components:      Result Value   Color, Urine YELLOW (*)    APPearance CLOUDY (*)    Protein, ur 100 (*)    Nitrite POSITIVE (*)    Leukocytes,Ua LARGE (*)    Bacteria, UA FEW (*)    All other components within normal limits  URINE CULTURE      Discussed with IR who  stated that given that the patient had been upgraded to a 16 French suprapubic catheter that could be exchanged at bedside and did not need to be done with IR.  UA does have findings concerning for urinary tract infection with positive nitrites and leukocyte esterase.  On chart review does not have a history of resistant urinary tract infection.  Given IM Rocephin  in the emergency department and will start on Keflex .  Urine was sent for culture.  Suprapubic catheter was exchanged.  Discharged home with discussion for follow-up with urology.   PROCEDURES:  Critical Care performed: No  SUPRAPUBIC TUBE PLACEMENT  Date/Time: 06/26/2023 11:56 PM  Performed by: Viviano Ground, MD Authorized by: Viviano Ground, MD   Consent:    Consent obtained:  Verbal   Consent given by:  Patient   Risks, benefits, and alternatives were discussed: yes     Risks discussed:  Bleeding, bowel perforation, infection and pain   Alternatives discussed:  Delayed treatment Universal protocol:    Procedure explained and questions answered to patient or  proxy's satisfaction: yes     Relevant documents present and verified: yes     Test results available: yes     Imaging studies available: yes     Required blood products, implants, devices, and special equipment available: yes     Patient identity confirmed:  Verbally with patient Sedation:    Sedation type:  None Anesthesia:    Anesthesia method:  Topical application   Topical anesthetic:  Lidocaine  gel Procedure details:    Complexity:  Simple   Catheter type:  Foley   Catheter size:  16 Fr   Ultrasound guidance: yes     Number of attempts:  1   Urine characteristics:  Blood-tinged Post-procedure details:    Procedure completion:  Tolerated   Patient's presentation is most consistent with acute presentation with potential threat to life or bodily function.   MEDICATIONS ORDERED IN ED: Medications  cefTRIAXone  (ROCEPHIN ) injection 1 g (1 g Intramuscular Given 06/26/23 2345)    FINAL CLINICAL IMPRESSION(S) / ED DIAGNOSES   Final diagnoses:  Acute cystitis without hematuria  Suprapubic catheter dysfunction, initial encounter (HCC)     Rx / DC Orders   ED Discharge Orders          Ordered    cephALEXin  (KEFLEX ) 500 MG capsule  2 times daily        06/26/23 2308             Note:  This document was prepared using Dragon voice recognition software and may include unintentional dictation errors.   Viviano Ground, MD 06/27/23 0001

## 2023-06-27 DIAGNOSIS — M4802 Spinal stenosis, cervical region: Secondary | ICD-10-CM | POA: Diagnosis not present

## 2023-06-27 DIAGNOSIS — E876 Hypokalemia: Secondary | ICD-10-CM | POA: Diagnosis not present

## 2023-06-27 DIAGNOSIS — T83511A Infection and inflammatory reaction due to indwelling urethral catheter, initial encounter: Secondary | ICD-10-CM | POA: Diagnosis not present

## 2023-06-27 DIAGNOSIS — K581 Irritable bowel syndrome with constipation: Secondary | ICD-10-CM | POA: Diagnosis not present

## 2023-06-27 DIAGNOSIS — Z853 Personal history of malignant neoplasm of breast: Secondary | ICD-10-CM | POA: Diagnosis not present

## 2023-06-27 DIAGNOSIS — I890 Lymphedema, not elsewhere classified: Secondary | ICD-10-CM | POA: Diagnosis not present

## 2023-06-27 DIAGNOSIS — G35 Multiple sclerosis: Secondary | ICD-10-CM | POA: Diagnosis not present

## 2023-06-27 DIAGNOSIS — G8220 Paraplegia, unspecified: Secondary | ICD-10-CM | POA: Diagnosis not present

## 2023-06-27 DIAGNOSIS — R1310 Dysphagia, unspecified: Secondary | ICD-10-CM | POA: Diagnosis not present

## 2023-06-27 DIAGNOSIS — N10 Acute tubulo-interstitial nephritis: Secondary | ICD-10-CM | POA: Diagnosis not present

## 2023-06-27 DIAGNOSIS — I081 Rheumatic disorders of both mitral and tricuspid valves: Secondary | ICD-10-CM | POA: Diagnosis not present

## 2023-06-27 DIAGNOSIS — I5032 Chronic diastolic (congestive) heart failure: Secondary | ICD-10-CM | POA: Diagnosis not present

## 2023-06-27 DIAGNOSIS — K8689 Other specified diseases of pancreas: Secondary | ICD-10-CM | POA: Diagnosis not present

## 2023-06-27 DIAGNOSIS — Z435 Encounter for attention to cystostomy: Secondary | ICD-10-CM | POA: Diagnosis not present

## 2023-06-27 DIAGNOSIS — K219 Gastro-esophageal reflux disease without esophagitis: Secondary | ICD-10-CM | POA: Diagnosis not present

## 2023-06-27 DIAGNOSIS — G894 Chronic pain syndrome: Secondary | ICD-10-CM | POA: Diagnosis not present

## 2023-06-27 DIAGNOSIS — D649 Anemia, unspecified: Secondary | ICD-10-CM | POA: Diagnosis not present

## 2023-06-27 DIAGNOSIS — N319 Neuromuscular dysfunction of bladder, unspecified: Secondary | ICD-10-CM | POA: Diagnosis not present

## 2023-06-27 DIAGNOSIS — E871 Hypo-osmolality and hyponatremia: Secondary | ICD-10-CM | POA: Diagnosis not present

## 2023-06-27 DIAGNOSIS — Z9049 Acquired absence of other specified parts of digestive tract: Secondary | ICD-10-CM | POA: Diagnosis not present

## 2023-06-27 DIAGNOSIS — H900 Conductive hearing loss, bilateral: Secondary | ICD-10-CM | POA: Diagnosis not present

## 2023-06-27 NOTE — ED Notes (Signed)
 Called [facility] to transport patient back home spoke with Lonzell Robin /she said it would be an hour they was coming from [facility]

## 2023-06-27 NOTE — ED Notes (Signed)
[facility] here to transport pt.

## 2023-06-28 DIAGNOSIS — Z435 Encounter for attention to cystostomy: Secondary | ICD-10-CM | POA: Diagnosis not present

## 2023-06-28 DIAGNOSIS — G35 Multiple sclerosis: Secondary | ICD-10-CM | POA: Diagnosis not present

## 2023-06-28 DIAGNOSIS — E876 Hypokalemia: Secondary | ICD-10-CM | POA: Diagnosis not present

## 2023-06-28 DIAGNOSIS — Z9049 Acquired absence of other specified parts of digestive tract: Secondary | ICD-10-CM | POA: Diagnosis not present

## 2023-06-28 DIAGNOSIS — K8689 Other specified diseases of pancreas: Secondary | ICD-10-CM | POA: Diagnosis not present

## 2023-06-28 DIAGNOSIS — R1310 Dysphagia, unspecified: Secondary | ICD-10-CM | POA: Diagnosis not present

## 2023-06-28 DIAGNOSIS — G894 Chronic pain syndrome: Secondary | ICD-10-CM | POA: Diagnosis not present

## 2023-06-28 DIAGNOSIS — E871 Hypo-osmolality and hyponatremia: Secondary | ICD-10-CM | POA: Diagnosis not present

## 2023-06-28 DIAGNOSIS — I5032 Chronic diastolic (congestive) heart failure: Secondary | ICD-10-CM | POA: Diagnosis not present

## 2023-06-28 DIAGNOSIS — K219 Gastro-esophageal reflux disease without esophagitis: Secondary | ICD-10-CM | POA: Diagnosis not present

## 2023-06-28 DIAGNOSIS — K581 Irritable bowel syndrome with constipation: Secondary | ICD-10-CM | POA: Diagnosis not present

## 2023-06-28 DIAGNOSIS — N10 Acute tubulo-interstitial nephritis: Secondary | ICD-10-CM | POA: Diagnosis not present

## 2023-06-28 DIAGNOSIS — I081 Rheumatic disorders of both mitral and tricuspid valves: Secondary | ICD-10-CM | POA: Diagnosis not present

## 2023-06-28 DIAGNOSIS — T83511A Infection and inflammatory reaction due to indwelling urethral catheter, initial encounter: Secondary | ICD-10-CM | POA: Diagnosis not present

## 2023-06-28 DIAGNOSIS — G8220 Paraplegia, unspecified: Secondary | ICD-10-CM | POA: Diagnosis not present

## 2023-06-28 DIAGNOSIS — N319 Neuromuscular dysfunction of bladder, unspecified: Secondary | ICD-10-CM | POA: Diagnosis not present

## 2023-06-28 DIAGNOSIS — M4802 Spinal stenosis, cervical region: Secondary | ICD-10-CM | POA: Diagnosis not present

## 2023-06-28 DIAGNOSIS — D649 Anemia, unspecified: Secondary | ICD-10-CM | POA: Diagnosis not present

## 2023-06-28 DIAGNOSIS — Z853 Personal history of malignant neoplasm of breast: Secondary | ICD-10-CM | POA: Diagnosis not present

## 2023-06-28 DIAGNOSIS — H900 Conductive hearing loss, bilateral: Secondary | ICD-10-CM | POA: Diagnosis not present

## 2023-06-28 DIAGNOSIS — I890 Lymphedema, not elsewhere classified: Secondary | ICD-10-CM | POA: Diagnosis not present

## 2023-06-28 NOTE — Telephone Encounter (Signed)
 LMTRC

## 2023-06-29 LAB — URINE CULTURE: Culture: >=100000 — AB

## 2023-07-04 ENCOUNTER — Ambulatory Visit

## 2023-07-04 NOTE — Telephone Encounter (Signed)
 Pt is fine. No concerns at this time.   CT has been r/s for this Friday. Had transportation issues.   She will see us  on 6/3.

## 2023-07-06 DIAGNOSIS — Z853 Personal history of malignant neoplasm of breast: Secondary | ICD-10-CM | POA: Diagnosis not present

## 2023-07-06 DIAGNOSIS — G8220 Paraplegia, unspecified: Secondary | ICD-10-CM | POA: Diagnosis not present

## 2023-07-06 DIAGNOSIS — Z9049 Acquired absence of other specified parts of digestive tract: Secondary | ICD-10-CM | POA: Diagnosis not present

## 2023-07-06 DIAGNOSIS — H900 Conductive hearing loss, bilateral: Secondary | ICD-10-CM | POA: Diagnosis not present

## 2023-07-06 DIAGNOSIS — K8689 Other specified diseases of pancreas: Secondary | ICD-10-CM | POA: Diagnosis not present

## 2023-07-06 DIAGNOSIS — D649 Anemia, unspecified: Secondary | ICD-10-CM | POA: Diagnosis not present

## 2023-07-06 DIAGNOSIS — I890 Lymphedema, not elsewhere classified: Secondary | ICD-10-CM | POA: Diagnosis not present

## 2023-07-06 DIAGNOSIS — N10 Acute tubulo-interstitial nephritis: Secondary | ICD-10-CM | POA: Diagnosis not present

## 2023-07-06 DIAGNOSIS — G894 Chronic pain syndrome: Secondary | ICD-10-CM | POA: Diagnosis not present

## 2023-07-06 DIAGNOSIS — G35 Multiple sclerosis: Secondary | ICD-10-CM | POA: Diagnosis not present

## 2023-07-06 DIAGNOSIS — I5032 Chronic diastolic (congestive) heart failure: Secondary | ICD-10-CM | POA: Diagnosis not present

## 2023-07-06 DIAGNOSIS — K219 Gastro-esophageal reflux disease without esophagitis: Secondary | ICD-10-CM | POA: Diagnosis not present

## 2023-07-06 DIAGNOSIS — I081 Rheumatic disorders of both mitral and tricuspid valves: Secondary | ICD-10-CM | POA: Diagnosis not present

## 2023-07-06 DIAGNOSIS — T83511A Infection and inflammatory reaction due to indwelling urethral catheter, initial encounter: Secondary | ICD-10-CM | POA: Diagnosis not present

## 2023-07-06 DIAGNOSIS — E876 Hypokalemia: Secondary | ICD-10-CM | POA: Diagnosis not present

## 2023-07-06 DIAGNOSIS — N319 Neuromuscular dysfunction of bladder, unspecified: Secondary | ICD-10-CM | POA: Diagnosis not present

## 2023-07-06 DIAGNOSIS — E871 Hypo-osmolality and hyponatremia: Secondary | ICD-10-CM | POA: Diagnosis not present

## 2023-07-06 DIAGNOSIS — M4802 Spinal stenosis, cervical region: Secondary | ICD-10-CM | POA: Diagnosis not present

## 2023-07-06 DIAGNOSIS — K581 Irritable bowel syndrome with constipation: Secondary | ICD-10-CM | POA: Diagnosis not present

## 2023-07-06 DIAGNOSIS — R1310 Dysphagia, unspecified: Secondary | ICD-10-CM | POA: Diagnosis not present

## 2023-07-06 DIAGNOSIS — Z435 Encounter for attention to cystostomy: Secondary | ICD-10-CM | POA: Diagnosis not present

## 2023-07-07 ENCOUNTER — Ambulatory Visit
Admission: RE | Admit: 2023-07-07 | Discharge: 2023-07-07 | Disposition: A | Discharge status: Home / Self Care | Source: Ambulatory Visit | Attending: Urology | Admitting: Urology

## 2023-07-07 DIAGNOSIS — G894 Chronic pain syndrome: Secondary | ICD-10-CM | POA: Diagnosis not present

## 2023-07-07 DIAGNOSIS — D649 Anemia, unspecified: Secondary | ICD-10-CM | POA: Diagnosis not present

## 2023-07-07 DIAGNOSIS — Z9049 Acquired absence of other specified parts of digestive tract: Secondary | ICD-10-CM | POA: Diagnosis not present

## 2023-07-07 DIAGNOSIS — I5032 Chronic diastolic (congestive) heart failure: Secondary | ICD-10-CM | POA: Diagnosis not present

## 2023-07-07 DIAGNOSIS — R8290 Unspecified abnormal findings in urine: Secondary | ICD-10-CM | POA: Insufficient documentation

## 2023-07-07 DIAGNOSIS — K449 Diaphragmatic hernia without obstruction or gangrene: Secondary | ICD-10-CM | POA: Diagnosis not present

## 2023-07-07 DIAGNOSIS — K8689 Other specified diseases of pancreas: Secondary | ICD-10-CM | POA: Diagnosis not present

## 2023-07-07 DIAGNOSIS — N210 Calculus in bladder: Secondary | ICD-10-CM | POA: Insufficient documentation

## 2023-07-07 DIAGNOSIS — T83090A Other mechanical complication of cystostomy catheter, initial encounter: Secondary | ICD-10-CM | POA: Diagnosis not present

## 2023-07-07 DIAGNOSIS — N10 Acute tubulo-interstitial nephritis: Secondary | ICD-10-CM | POA: Diagnosis not present

## 2023-07-07 DIAGNOSIS — T83511A Infection and inflammatory reaction due to indwelling urethral catheter, initial encounter: Secondary | ICD-10-CM | POA: Diagnosis not present

## 2023-07-07 DIAGNOSIS — K219 Gastro-esophageal reflux disease without esophagitis: Secondary | ICD-10-CM | POA: Diagnosis not present

## 2023-07-07 DIAGNOSIS — G8220 Paraplegia, unspecified: Secondary | ICD-10-CM | POA: Diagnosis not present

## 2023-07-07 DIAGNOSIS — Z853 Personal history of malignant neoplasm of breast: Secondary | ICD-10-CM | POA: Diagnosis not present

## 2023-07-07 DIAGNOSIS — H900 Conductive hearing loss, bilateral: Secondary | ICD-10-CM | POA: Diagnosis not present

## 2023-07-07 DIAGNOSIS — K581 Irritable bowel syndrome with constipation: Secondary | ICD-10-CM | POA: Diagnosis not present

## 2023-07-07 DIAGNOSIS — N319 Neuromuscular dysfunction of bladder, unspecified: Secondary | ICD-10-CM | POA: Diagnosis not present

## 2023-07-07 DIAGNOSIS — G35 Multiple sclerosis: Secondary | ICD-10-CM | POA: Diagnosis not present

## 2023-07-07 DIAGNOSIS — M4802 Spinal stenosis, cervical region: Secondary | ICD-10-CM | POA: Diagnosis not present

## 2023-07-07 DIAGNOSIS — Z743 Need for continuous supervision: Secondary | ICD-10-CM | POA: Diagnosis not present

## 2023-07-07 DIAGNOSIS — E871 Hypo-osmolality and hyponatremia: Secondary | ICD-10-CM | POA: Diagnosis not present

## 2023-07-07 DIAGNOSIS — I081 Rheumatic disorders of both mitral and tricuspid valves: Secondary | ICD-10-CM | POA: Diagnosis not present

## 2023-07-07 DIAGNOSIS — R1310 Dysphagia, unspecified: Secondary | ICD-10-CM | POA: Diagnosis not present

## 2023-07-07 DIAGNOSIS — N3289 Other specified disorders of bladder: Secondary | ICD-10-CM | POA: Diagnosis not present

## 2023-07-07 DIAGNOSIS — E876 Hypokalemia: Secondary | ICD-10-CM | POA: Diagnosis not present

## 2023-07-07 DIAGNOSIS — I890 Lymphedema, not elsewhere classified: Secondary | ICD-10-CM | POA: Diagnosis not present

## 2023-07-07 DIAGNOSIS — Z435 Encounter for attention to cystostomy: Secondary | ICD-10-CM | POA: Diagnosis not present

## 2023-07-08 NOTE — Progress Notes (Unsigned)
 07/11/2023 9:13 AM   Adria Alas 21-May-1960 [id number]  Referring provider: Adeline Hone, PA-C [address] [place],  [place] [address]  Urological history: 1.  Neurogenic bladder - UDS on 04/20/2020 small capacity 60 cc neurogenic bladder with notable right-sided reflux with relatively low bladder pressures - managed with SPT    2. Chronic bacteriuria  -secondary to chronic indwelling Foley    No chief complaint on file.  HPI: Rebecca [name] is a 63 y.o. female who presents today for follow-up.    Previous records reviewed.   She has had complications after her SPT was placed.  She was hospitalized the day after she had her initial SPT placement for sepsis and gross heme.  She is now having issues with the SPT becoming clogged with sediment resulting in visits to the ED.    She underwent a CT renal stone study (07/07/2023) - tiny bladder stones measuring up to 2 mm.        PMH: Past Medical History:  Diagnosis Date   Allergy    Aortic valve disease    Mild AS / AI - most recent Echo demonstrated tricuspid aortic valve.   Bacterial endocarditis    History of .   Bilateral impacted cerumen 06/29/2021   Bilateral lower extremity edema    Noncardiac.  Chronic. LE Venous dopplers - negative for DVT.; Echocardiogram January 2016: Normal EF with normal wall motion and valve function. Only grade 1 diastolic dysfunction. EF 60-65%. Mild MR   Breast cancer (HCC) 12/31/2013   Right breast, 12:00, 1.5 cm, T1c,N0 invasive mammary carcinoma, triple negative. --> Rx with Chemo   Cervical stenosis of spine    GERD (gastroesophageal reflux disease) April or May 2022   Heart murmur 2013   Herpes zoster    IBS (irritable bowel syndrome)    Lymphedema    has legs wrapped at [facility]   Multiple sclerosis (HCC) 2001   Walks from room to room @ home; but Wheelchair when going out.   Neuromuscular disorder (HCC)    MS   PONV (postoperative nausea and vomiting)     Related to Fentanyl    Seizures (HCC)    Takes Keppra    SOB (shortness of breath) 03/01/2013   Syncope and collapse    Urinary tract infection associated with indwelling urethral catheter (HCC) 02/17/2020    Surgical History: Past Surgical History:  Procedure Laterality Date   ANKLE SURGERY     Left   ANKLE SURGERY     ANTERIOR CERVICAL DECOMP/DISCECTOMY FUSION  11/17/2011   Procedure: ANTERIOR CERVICAL DECOMPRESSION/DISCECTOMY FUSION 2 LEVELS;  Surgeon: Manya Sells, MD;  Location: [facility];  Service: Neurosurgery;  Laterality: N/A;  Cervical Five-Six Six-Seven Anterior cervical decompression/diskectomy/fusion   BREAST BIOPSY Right 12/31/2013   invasive mammary   BREAST SURGERY Right 02/03/2014   Right simple mastectomy with sentinel node biopsy.   CHOLECYSTECTOMY     COLONOSCOPY  2014   ESOPHAGOGASTRODUODENOSCOPY (EGD) WITH PROPOFOL  N/A 07/16/2020   Procedure: ESOPHAGOGASTRODUODENOSCOPY (EGD) WITH PROPOFOL ;  Surgeon: Irby Mannan, MD;  Location: [facility] ENDOSCOPY;  Service: Endoscopy;  Laterality: N/A;  Patient has MS and will need assistance   HYSTEROSCOPY WITH D & C N/A 11/27/2018   Procedure: DILATATION AND CURETTAGE /HYSTEROSCOPY;  Surgeon: Alben Alma, MD;  Location: [facility] ORS;  Service: Gynecology;  Laterality: N/A;   IR CATHETER TUBE CHANGE  05/05/2023   IR CYSTOSTOMY TUBE CHANGE COMPLICATED  06/09/2023   IR CYSTOSTOMY TUBE  PLACEMENT/BLADDER ASPIRATION  03/23/2023   Lower extremity venous Dopplers  02/27/2013   No LE DVT   MASTECTOMY Right 2015   ORIF TIBIA PLATEAU Left 12/09/2020   Procedure: LEFT OPEN REDUCTION INTERNAL FIXATION (ORIF) TIBIAL PLATEAU;  Surgeon: Laneta Pintos, MD;  Location: [facility] OR;  Service: Orthopedics;  Laterality: Left;   Port a cath insertion Right 01/19/2010   PORT-A-CATH REMOVAL     right   PORT-A-CATH REMOVAL Right 09/03/2013   Procedure: REMOVAL PORT-A-CATH;  Surgeon: Arvil Lauber, MD;  Location: [facility] OR;  Service: Vascular;  Laterality:  Right;   SPINE SURGERY  Cervical steinois Dr. Nigel Bart 2014 I think?   TONSILLECTOMY     TRANSTHORACIC ECHOCARDIOGRAM  03/2013; 02/2014   a) Normal LV size and function with EF 60-65%.; Cannot exclude bicuspid aortic valve with mild AS and mild AI.; b) Normal EF with normal wall motion and valve function x Mild MR. G2 DD. EF 60-65%. Tricuspid AoV   UPPER GI ENDOSCOPY  2014    Home Medications:  Allergies as of 07/11/2023       Reactions   Fentanyl  Nausea And Vomiting, Nausea Only   vomiting Was given in PACU x3 each time patient got sick   Sulfa Antibiotics Hives, Other (See Comments)   Light headed, over heated        Medication List        Accurate as of Jul 08, 2023  9:13 AM. If you have any questions, ask your nurse or doctor.          amantadine  100 MG capsule Commonly known as: SYMMETREL  TAKE 1 CAPSULE BY MOUTH THREE TIMES A DAY   baclofen  10 MG tablet Commonly known as: LIORESAL  Take 1-2 tablets (10-20 mg total) by mouth 3 (three) times daily.   COSAMIN DS PO Take 1 tablet by mouth 3 (three) times daily.   gluconic acid-citric acid irrigation Insert 30 mL into suprapubic tube and clamp tube for 10 minutes and then drain twice daily   Leg Vein & Circulation Tabs Take 1 tablet by mouth 2 (two) times daily.   levETIRAcetam  500 MG tablet Commonly known as: KEPPRA  Take 1 tablet (500 mg total) by mouth 2 (two) times daily.   lipase/protease/amylase 14782 UNITS Cpep capsule Commonly known as: Creon  Take 2 capsules (72,000 Units total) by mouth 3 (three) times daily with meals AND 1 capsule (36,000 Units total) with snacks. What changed: See the new instructions.   loratadine  10 MG tablet Commonly known as: CLARITIN  Take 10 mg by mouth daily.   MULTIVITAMIN PO Take 1 tablet by mouth daily.   nystatin  powder Commonly known as: MYCOSTATIN /NYSTOP  Apply 1 application topically 3 (three) times daily as needed. For rash/raw skin as needed   omeprazole  40 MG  capsule Commonly known as: PRILOSEC TAKE 1 CAPSULE (40 MG TOTAL) BY MOUTH DAILY.   oxybutynin  10 MG 24 hr tablet Commonly known as: DITROPAN -XL Take 1 tablet (10 mg total) by mouth 2 (two) times daily.   Rebif  Rebidose 44 MCG/0.5ML Soaj Generic drug: Interferon Beta-1a  Inject 0.5 mLs into the skin 3 (three) times a week.   tetracycline  500 MG capsule Commonly known as: SUMYCIN  Take 1 capsule (500 mg total) by mouth 2 (two) times daily.   tiZANidine  4 MG tablet Commonly known as: ZANAFLEX  Take 1 tablet (4 mg total) by mouth at bedtime.   Turmeric 500 MG Caps Take 500 mg by mouth daily.        Allergies:  Allergies  Allergen Reactions   Fentanyl  Nausea And Vomiting and Nausea Only    vomiting Was given in PACU x3 each time patient got sick    Sulfa Antibiotics Hives and Other (See Comments)    Light headed, over heated    Family History: Family History  Problem Relation Age of Onset   Cancer Father        skin   Heart disease Father    Heart attack Father        heart attack in his 56's   Thyroid  disease Sister    Ovarian cancer Cousin    Breast cancer Maternal Aunt 17   Breast cancer Maternal Grandmother 38   Bladder Cancer Neg Hx    Kidney cancer Neg Hx     Social History:  reports that she has never smoked. She has never used smokeless tobacco. She reports that she does not drink alcohol and does not use drugs.  ROS: Pertinent ROS in HPI  Physical Exam: There were no vitals taken for this visit.  Constitutional:  Well nourished. Alert and oriented, No acute distress. HEENT: [place] AT, moist mucus membranes.  Trachea midline, no masses. Cardiovascular: No clubbing, cyanosis, or edema. Respiratory: Normal respiratory effort, no increased work of breathing. GU: No CVA tenderness.  No bladder fullness or masses.  Recession of labia minora, dry, pale vulvar vaginal mucosa and loss of mucosal ridges and folds.  Normal urethral meatus, no lesions, no prolapse,  no discharge.   No urethral masses, tenderness and/or tenderness. No bladder fullness, tenderness or masses. *** vagina mucosa, *** estrogen effect, no discharge, no lesions, *** pelvic support, *** cystocele and *** rectocele noted.  No cervical motion tenderness.  Uterus is freely mobile and non-fixed.  No adnexal/parametria masses or tenderness noted.  Anus and perineum are without rashes or lesions.   ***  Neurologic: Grossly intact, no focal deficits, moving all 4 extremities. Psychiatric: Normal mood and affect.    Laboratory Data: Urinalysis See EPIC and HPI *** I have reviewed the labs.   Pertinent Imaging: EXAMINATION: CT RENAL STONE STUDY   CLINICAL INDICATION: Female, 63 years old. Foreign body, GU tract   TECHNIQUE: Helical CT scan examination of the abdomen and pelvis is performed from the domes of the diaphragm to the pubic symphysis. Limited evaluation of the solid organs due to lack of intravenous contrast. Unless otherwise specified, incidental thyroid , adrenal, renal lesions do not require dedicated imaging follow up. Additionally, any mentioned pulmonary nodules do not require dedicated imaging follow-up based on the Fleischner guidelines unless otherwise specified. Coronary calcifications are not identified unless otherwise specified.   COMPARISON: 03/24/2023   FINDINGS:   There are minimal atelectatic changes in the lung bases. The heart is normal in size. There are coronary calcifications. Central line tip terminates in the right atrium. Small to moderate hiatal hernia noted. The liver appears normal. The gallbladder surgically absent. The spleen is normal. The pancreas is normal. The adrenals are normal. The kidneys are normal. The abdominal aorta is normal in caliber. Scattered calcified atherosclerotic changes are present. There is a suprapubic Foley catheter within the urinary bladder which is decompressed. There are suspected tiny stones within the urinary  bladder measuring 2 mm each. There is diffuse bladder wall thickening.   The uterus contains a small calcified fibroid. Large and small bowel loops are otherwise within normal limits. No free fluid or adenopathy. There is diffuse osseous demineralization with degenerative changes of the spine and bony pelvis.  IMPRESSION:   Indwelling suprapubic Foley catheter with bladder wall thickening which may be from cystitis or underdistention.   Tiny bladder stones measuring up to 2 mm.   DOSE REDUCTION: This exam was performed according to our departmental dose-optimization program which includes automated exposure control, adjustment of the mA and/or kV according to patient size and/or use of iterative reconstruction technique.   Electronically signed by: [place] Engel MD 07/07/2023 06:45 PM EDT RP Workstation: [id number] I have independently reviewed the films.  See HPI.    Assessment & Plan:    1.  Neurogenic bladder -   2. Bladder stones - bladder stones are too small to be addressed with a procedure    No follow-ups on file.  These notes generated with voice recognition software. I apologize for typographical errors.  Briant Camper  [facility] [address]  [address] [place], [place] [address] [phone]

## 2023-07-11 ENCOUNTER — Encounter: Payer: Self-pay | Admitting: Urology

## 2023-07-11 ENCOUNTER — Ambulatory Visit (INDEPENDENT_AMBULATORY_CARE_PROVIDER_SITE_OTHER): Admitting: Urology

## 2023-07-11 VITALS — BP 136/84 | HR 90 | Ht 64.0 in | Wt 205.0 lb

## 2023-07-11 DIAGNOSIS — N210 Calculus in bladder: Secondary | ICD-10-CM | POA: Diagnosis not present

## 2023-07-11 DIAGNOSIS — N319 Neuromuscular dysfunction of bladder, unspecified: Secondary | ICD-10-CM | POA: Diagnosis not present

## 2023-07-11 DIAGNOSIS — R8290 Unspecified abnormal findings in urine: Secondary | ICD-10-CM

## 2023-07-11 MED ORDER — RENACIDIN IR SOLN: 1 refills | Status: DC

## 2023-07-13 ENCOUNTER — Other Ambulatory Visit: Payer: Self-pay

## 2023-07-13 DIAGNOSIS — Z853 Personal history of malignant neoplasm of breast: Secondary | ICD-10-CM | POA: Insufficient documentation

## 2023-07-13 DIAGNOSIS — N390 Urinary tract infection, site not specified: Secondary | ICD-10-CM | POA: Diagnosis not present

## 2023-07-13 DIAGNOSIS — Z435 Encounter for attention to cystostomy: Secondary | ICD-10-CM | POA: Diagnosis not present

## 2023-07-13 DIAGNOSIS — H900 Conductive hearing loss, bilateral: Secondary | ICD-10-CM | POA: Diagnosis not present

## 2023-07-13 DIAGNOSIS — K581 Irritable bowel syndrome with constipation: Secondary | ICD-10-CM | POA: Diagnosis not present

## 2023-07-13 DIAGNOSIS — E871 Hypo-osmolality and hyponatremia: Secondary | ICD-10-CM | POA: Diagnosis not present

## 2023-07-13 DIAGNOSIS — G8220 Paraplegia, unspecified: Secondary | ICD-10-CM | POA: Diagnosis not present

## 2023-07-13 DIAGNOSIS — K8689 Other specified diseases of pancreas: Secondary | ICD-10-CM | POA: Diagnosis not present

## 2023-07-13 DIAGNOSIS — G894 Chronic pain syndrome: Secondary | ICD-10-CM | POA: Diagnosis not present

## 2023-07-13 DIAGNOSIS — M4802 Spinal stenosis, cervical region: Secondary | ICD-10-CM | POA: Diagnosis not present

## 2023-07-13 DIAGNOSIS — N10 Acute tubulo-interstitial nephritis: Secondary | ICD-10-CM | POA: Diagnosis not present

## 2023-07-13 DIAGNOSIS — N319 Neuromuscular dysfunction of bladder, unspecified: Secondary | ICD-10-CM | POA: Diagnosis not present

## 2023-07-13 DIAGNOSIS — D649 Anemia, unspecified: Secondary | ICD-10-CM | POA: Diagnosis not present

## 2023-07-13 DIAGNOSIS — E876 Hypokalemia: Secondary | ICD-10-CM | POA: Insufficient documentation

## 2023-07-13 DIAGNOSIS — I890 Lymphedema, not elsewhere classified: Secondary | ICD-10-CM | POA: Diagnosis not present

## 2023-07-13 DIAGNOSIS — T83511A Infection and inflammatory reaction due to indwelling urethral catheter, initial encounter: Secondary | ICD-10-CM | POA: Diagnosis not present

## 2023-07-13 DIAGNOSIS — I5032 Chronic diastolic (congestive) heart failure: Secondary | ICD-10-CM | POA: Diagnosis not present

## 2023-07-13 DIAGNOSIS — R0689 Other abnormalities of breathing: Secondary | ICD-10-CM | POA: Diagnosis not present

## 2023-07-13 DIAGNOSIS — I081 Rheumatic disorders of both mitral and tricuspid valves: Secondary | ICD-10-CM | POA: Diagnosis not present

## 2023-07-13 DIAGNOSIS — R6889 Other general symptoms and signs: Secondary | ICD-10-CM | POA: Diagnosis not present

## 2023-07-13 DIAGNOSIS — Z9049 Acquired absence of other specified parts of digestive tract: Secondary | ICD-10-CM | POA: Diagnosis not present

## 2023-07-13 DIAGNOSIS — G35 Multiple sclerosis: Secondary | ICD-10-CM | POA: Diagnosis not present

## 2023-07-13 DIAGNOSIS — I499 Cardiac arrhythmia, unspecified: Secondary | ICD-10-CM | POA: Diagnosis not present

## 2023-07-13 DIAGNOSIS — K219 Gastro-esophageal reflux disease without esophagitis: Secondary | ICD-10-CM | POA: Diagnosis not present

## 2023-07-13 DIAGNOSIS — R1310 Dysphagia, unspecified: Secondary | ICD-10-CM | POA: Diagnosis not present

## 2023-07-13 DIAGNOSIS — R531 Weakness: Secondary | ICD-10-CM | POA: Diagnosis not present

## 2023-07-13 LAB — COMPREHENSIVE METABOLIC PANEL WITH GFR
ALT: 19 U/L (ref 0–44)
AST: 23 U/L (ref 15–41)
Albumin: 3.4 g/dL — ABNORMAL LOW (ref 3.5–5.0)
Alkaline Phosphatase: 65 U/L (ref 38–126)
Anion gap: 9 (ref 5–15)
BUN: 11 mg/dL (ref 8–23)
CO2: 21 mmol/L — ABNORMAL LOW (ref 22–32)
Calcium: 8.5 mg/dL — ABNORMAL LOW (ref 8.9–10.3)
Chloride: 100 mmol/L (ref 98–111)
Creatinine, Ser: 0.53 mg/dL (ref 0.44–1.00)
GFR, Estimated: >60 mL/min (ref >60)
Glucose, Bld: 150 mg/dL — ABNORMAL HIGH (ref 70–99)
Potassium: 3.2 mmol/L — ABNORMAL LOW (ref 3.5–5.1)
Sodium: 130 mmol/L — ABNORMAL LOW (ref 135–145)
Total Bilirubin: 0.9 mg/dL (ref 0.0–1.2)
Total Protein: 7.6 g/dL (ref 6.5–8.1)

## 2023-07-13 LAB — URINALYSIS, ROUTINE W REFLEX MICROSCOPIC
Bilirubin Urine: NEGATIVE
Glucose, UA: NEGATIVE mg/dL
Ketones, ur: 20 mg/dL — AB
Nitrite: POSITIVE — AB
Protein, ur: 100 mg/dL — AB
Specific Gravity, Urine: 1.017 (ref 1.005–1.030)
WBC, UA: >50 WBC/hpf (ref 0–5)
pH: 5 (ref 5.0–8.0)

## 2023-07-13 LAB — CBC
HCT: 37.5 % (ref 36.0–46.0)
Hemoglobin: 12.6 g/dL (ref 12.0–15.0)
MCH: 28.7 pg (ref 26.0–34.0)
MCHC: 33.6 g/dL (ref 30.0–36.0)
MCV: 85.4 fL (ref 80.0–100.0)
Platelets: 124 10*3/uL — ABNORMAL LOW (ref 150–400)
RBC: 4.39 MIL/uL (ref 3.87–5.11)
RDW: 13.1 % (ref 11.5–15.5)
WBC: 11.5 10*3/uL — ABNORMAL HIGH (ref 4.0–10.5)
nRBC: 0 % (ref 0.0–0.2)

## 2023-07-13 NOTE — ED Triage Notes (Signed)
 Pt to ED via [facility] from home c/o weakness and loss of appetite. Hasn't eaten since Tuesday and hasn't had much appetite. Had one episode of vomiting today. Denies any abd pain, CP, SOB, fevers, dizziness. Hx of MS, hasn't been taken her meds since tuesday

## 2023-07-13 NOTE — ED Triage Notes (Signed)
 EMS brings pt in from home; bed-bound, cath in place; c/o "not feeling well"

## 2023-07-13 NOTE — ED Triage Notes (Signed)
 Patient arrives via EMS stating that she has not had an appetite for three days, and had one episode of vomiting. Denies any abdominal pain at this time.

## 2023-07-14 ENCOUNTER — Emergency Department
Admission: EM | Admit: 2023-07-14 | Discharge: 2023-07-14 | Disposition: A | Discharge status: Home / Self Care | Attending: Emergency Medicine | Admitting: Emergency Medicine

## 2023-07-14 DIAGNOSIS — R531 Weakness: Secondary | ICD-10-CM

## 2023-07-14 DIAGNOSIS — Z743 Need for continuous supervision: Secondary | ICD-10-CM | POA: Diagnosis not present

## 2023-07-14 DIAGNOSIS — N390 Urinary tract infection, site not specified: Secondary | ICD-10-CM

## 2023-07-14 DIAGNOSIS — E871 Hypo-osmolality and hyponatremia: Secondary | ICD-10-CM | POA: Diagnosis not present

## 2023-07-14 DIAGNOSIS — G35 Multiple sclerosis: Secondary | ICD-10-CM | POA: Diagnosis not present

## 2023-07-14 DIAGNOSIS — G459 Transient cerebral ischemic attack, unspecified: Secondary | ICD-10-CM | POA: Diagnosis not present

## 2023-07-14 LAB — LACTIC ACID, PLASMA
Lactic Acid, Venous: 1 mmol/L (ref 0.5–1.9)
Lactic Acid, Venous: 0.9 mmol/L (ref 0.5–1.9)

## 2023-07-14 LAB — MAGNESIUM: Magnesium: 1.7 mg/dL (ref 1.7–2.4)

## 2023-07-14 MED ORDER — CEPHALEXIN 500 MG PO CAPS: 500.0000 mg | ORAL_CAPSULE | Freq: Two times a day (BID) | ORAL | 0 refills | Status: DC

## 2023-07-14 MED ORDER — SODIUM CHLORIDE 0.9 % IV BOLUS (SEPSIS)
1000.0000 mL | Freq: Once | INTRAVENOUS | Status: AC
  Administered 2023-07-14: 1000 mL via INTRAVENOUS

## 2023-07-14 MED ORDER — ACETAMINOPHEN 500 MG PO TABS
1000.0000 mg | ORAL_TABLET | Freq: Once | ORAL | Status: AC
  Administered 2023-07-14: 1000 mg via ORAL
  Filled 2023-07-14: qty 2

## 2023-07-14 MED ORDER — SODIUM CHLORIDE 0.9 % IV SOLN
2.0000 g | Freq: Once | INTRAVENOUS | Status: AC
  Administered 2023-07-14: 2 g via INTRAVENOUS
  Filled 2023-07-14: qty 20

## 2023-07-14 MED ORDER — POTASSIUM CHLORIDE 20 MEQ PO PACK
40.0000 meq | PACK | Freq: Once | ORAL | Status: AC
  Administered 2023-07-14: 40 meq via ORAL
  Filled 2023-07-14: qty 2

## 2023-07-14 MED ORDER — ONDANSETRON HCL 4 MG/2ML IJ SOLN
4.0000 mg | Freq: Once | INTRAMUSCULAR | Status: AC
  Administered 2023-07-14: 4 mg via INTRAVENOUS
  Filled 2023-07-14: qty 2

## 2023-07-14 NOTE — ED Provider Notes (Signed)
 [facility] Provider Note    Event Date/Time   First MD Initiated Contact with Patient 07/14/23 0038     (approximate)   History   Weakness   HPI  Rebecca [name] is a 63 y.o. female with history of multiple sclerosis, seizures, neurogenic bladder status post suprapubic catheter who presents to the emergency department complaints of generalized weakness.  Patient does not ambulate at baseline.  She lives at home with her husband.  She denies any new numbness, tingling or focal weakness.  No fevers but has had nausea, decreased appetite.  Denies vomiting, diarrhea.  No chest pain or shortness of breath.   History provided by patient.    Past Medical History:  Diagnosis Date   Allergy    Aortic valve disease    Mild AS / AI - most recent Echo demonstrated tricuspid aortic valve.   Bacterial endocarditis    History of .   Bilateral impacted cerumen 06/29/2021   Bilateral lower extremity edema    Noncardiac.  Chronic. LE Venous dopplers - negative for DVT.; Echocardiogram January 2016: Normal EF with normal wall motion and valve function. Only grade 1 diastolic dysfunction. EF 60-65%. Mild MR   Breast cancer (HCC) 12/31/2013   Right breast, 12:00, 1.5 cm, T1c,N0 invasive mammary carcinoma, triple negative. --> Rx with Chemo   Cervical stenosis of spine    GERD (gastroesophageal reflux disease) April or May 2022   Heart murmur 2013   Herpes zoster    IBS (irritable bowel syndrome)    Lymphedema    has legs wrapped at [facility]   Multiple sclerosis (HCC) 2001   Walks from room to room @ home; but Wheelchair when going out.   Neuromuscular disorder (HCC)    MS   PONV (postoperative nausea and vomiting)    Related to Fentanyl    Seizures (HCC)    Takes Keppra    SOB (shortness of breath) 03/01/2013   Syncope and collapse    Urinary tract infection associated with indwelling urethral catheter (HCC) 02/17/2020    Past Surgical History:  Procedure  Laterality Date   ANKLE SURGERY     Left   ANKLE SURGERY     ANTERIOR CERVICAL DECOMP/DISCECTOMY FUSION  11/17/2011   Procedure: ANTERIOR CERVICAL DECOMPRESSION/DISCECTOMY FUSION 2 LEVELS;  Surgeon: Manya Sells, MD;  Location: [facility];  Service: Neurosurgery;  Laterality: N/A;  Cervical Five-Six Six-Seven Anterior cervical decompression/diskectomy/fusion   BREAST BIOPSY Right 12/31/2013   invasive mammary   BREAST SURGERY Right 02/03/2014   Right simple mastectomy with sentinel node biopsy.   CHOLECYSTECTOMY     COLONOSCOPY  2014   ESOPHAGOGASTRODUODENOSCOPY (EGD) WITH PROPOFOL  N/A 07/16/2020   Procedure: ESOPHAGOGASTRODUODENOSCOPY (EGD) WITH PROPOFOL ;  Surgeon: Irby Mannan, MD;  Location: [facility] ENDOSCOPY;  Service: Endoscopy;  Laterality: N/A;  Patient has MS and will need assistance   HYSTEROSCOPY WITH D & C N/A 11/27/2018   Procedure: DILATATION AND CURETTAGE /HYSTEROSCOPY;  Surgeon: Alben Alma, MD;  Location: [facility] ORS;  Service: Gynecology;  Laterality: N/A;   IR CATHETER TUBE CHANGE  05/05/2023   IR CYSTOSTOMY TUBE CHANGE COMPLICATED  06/09/2023   IR CYSTOSTOMY TUBE PLACEMENT/BLADDER ASPIRATION  03/23/2023   Lower extremity venous Dopplers  02/27/2013   No LE DVT   MASTECTOMY Right 2015   ORIF TIBIA PLATEAU Left 12/09/2020   Procedure: LEFT OPEN REDUCTION INTERNAL FIXATION (ORIF) TIBIAL PLATEAU;  Surgeon: Laneta Pintos, MD;  Location: [facility] OR;  Service: Orthopedics;  Laterality: Left;   Port a cath insertion Right 01/19/2010   PORT-A-CATH REMOVAL     right   PORT-A-CATH REMOVAL Right 09/03/2013   Procedure: REMOVAL PORT-A-CATH;  Surgeon: Arvil Lauber, MD;  Location: [facility] OR;  Service: Vascular;  Laterality: Right;   SPINE SURGERY  Cervical steinois Dr. Nigel Bart 2014 I think?   TONSILLECTOMY     TRANSTHORACIC ECHOCARDIOGRAM  03/2013; 02/2014   a) Normal LV size and function with EF 60-65%.; Cannot exclude bicuspid aortic valve with mild AS and mild AI.; b) Normal EF with  normal wall motion and valve function x Mild MR. G2 DD. EF 60-65%. Tricuspid AoV   UPPER GI ENDOSCOPY  2014    MEDICATIONS:  Prior to Admission medications   Medication Sig Start Date End Date Taking? Authorizing Provider  amantadine  (SYMMETREL ) 100 MG capsule TAKE 1 CAPSULE BY MOUTH THREE TIMES A DAY 04/17/23   Penumalli, Vikram R, MD  baclofen  (LIORESAL ) 10 MG tablet Take 1-2 tablets (10-20 mg total) by mouth 3 (three) times daily. 08/15/22   Penumalli, Vikram R, MD  gluconic acid-citric acid (RENACIDIN ) irrigation Insert 30 mL into suprapubic tube and clamp tube for 10 minutes and then drain twice daily 07/11/23   McGowan, Cathleen Coach A, PA-C  Glucosamine-Chondroitin (COSAMIN DS PO) Take 1 tablet by mouth 3 (three) times daily.    [provider]  levETIRAcetam  (KEPPRA ) 500 MG tablet Take 1 tablet (500 mg total) by mouth 2 (two) times daily. 08/15/22   Penumalli, Vikram R, MD  lipase/protease/amylase (CREON ) 36000 UNITS CPEP capsule Take 2 capsules (72,000 Units total) by mouth 3 (three) times daily with meals AND 1 capsule (36,000 Units total) with snacks. Patient taking differently: Take 1-2 capsules (36,000- 72,000 Units total) by mouth with (three) times daily with meals AND 1 capsule (36,000 Units total) with snacks. (1 capsule by mouth with breakfast, 2 capsules by mouth with lunch and 1 capsule by mouth with dinner)   08/25/22 08/20/23  Brigitte Canard, PA-C  loratadine  (CLARITIN ) 10 MG tablet Take 10 mg by mouth daily.    [provider]  Misc Natural Products (LEG VEIN & CIRCULATION) TABS Take 1 tablet by mouth 2 (two) times daily.     [provider]  Multiple Vitamins-Minerals (MULTIVITAMIN PO) Take 1 tablet by mouth daily.     [provider]  nystatin  (MYCOSTATIN /NYSTOP ) powder Apply 1 application topically 3 (three) times daily as needed. For rash/raw skin as needed 07/31/19   Tapia, Leisa, PA-C  omeprazole  (PRILOSEC) 40 MG capsule TAKE 1 CAPSULE (40 MG TOTAL)  BY MOUTH DAILY. 01/16/23   Tapia, Leisa, PA-C  oxybutynin  (DITROPAN -XL) 10 MG 24 hr tablet Take 1 tablet (10 mg total) by mouth 2 (two) times daily. 01/16/23   McGowan, Cathleen Coach A, PA-C  REBIF  REBIDOSE 44 MCG/0.5ML SOAJ Inject 0.5 mLs into the skin 3 (three) times a week. 04/24/23 07/23/23  Penumalli, Vikram R, MD  tetracycline  (SUMYCIN ) 500 MG capsule Take 1 capsule (500 mg total) by mouth 2 (two) times daily. 05/02/23   Matilde Son A, PA-C  tiZANidine  (ZANAFLEX ) 4 MG tablet Take 1 tablet (4 mg total) by mouth at bedtime. 08/15/22   Penumalli, Vikram R, MD  Turmeric 500 MG CAPS Take 500 mg by mouth daily.    [provider]    Physical Exam   Triage Vital Signs: ED Triage Vitals  Encounter Vitals Group     BP 07/13/23 2006 130/73     Systolic BP Percentile --  Diastolic BP Percentile --      Pulse Rate 07/13/23 2006 (!) 103     Resp 07/13/23 2006 18     Temp 07/13/23 2006 99.1 F (37.3 C)     Temp Source 07/13/23 2006 Oral     SpO2 07/13/23 1955 95 %     Weight --      Height --      Head Circumference --      Peak Flow --      Pain Score 07/13/23 2004 0     Pain Loc --      Pain Education --      Exclude from Growth Chart --     Most recent vital signs: Vitals:   07/14/23 0430 07/14/23 0435  BP:  111/61  Pulse: 94   Resp: 19   Temp:    SpO2: 93%     CONSTITUTIONAL: Alert, responds appropriately to questions.  Chronically ill-appearing HEAD: Normocephalic, atraumatic EYES: Conjunctivae clear, pupils appear equal, sclera nonicteric ENT: normal nose; moist mucous membranes NECK: Supple, normal ROM CARD: Regular and tachycardic; S1 and S2 appreciated RESP: Normal chest excursion without splinting or tachypnea; breath sounds clear and equal bilaterally; no wheezes, no rhonchi, no rales, no hypoxia or respiratory distress, speaking full sentences ABD/GI: Non-distended; soft, non-tender, no rebound, no guarding, no peritoneal signs, indwelling catheter in  place BACK: The back appears normal EXT: Normal ROM in all joints; no deformity noted, no edema SKIN: Normal color for age and race; warm; no rash on exposed skin NEURO: Moves all extremities equally, normal speech PSYCH: The patient's mood and manner are appropriate.   ED Results / Procedures / Treatments   LABS: (all labs ordered are listed, but only abnormal results are displayed) Labs Reviewed  COMPREHENSIVE METABOLIC PANEL WITH GFR - Abnormal; Notable for the following components:      Result Value   Sodium 130 (*)    Potassium 3.2 (*)    CO2 21 (*)    Glucose, Bld 150 (*)    Calcium  8.5 (*)    Albumin 3.4 (*)    All other components within normal limits  CBC - Abnormal; Notable for the following components:   WBC 11.5 (*)    Platelets 124 (*)    All other components within normal limits  URINALYSIS, ROUTINE W REFLEX MICROSCOPIC - Abnormal; Notable for the following components:   Color, Urine YELLOW (*)    APPearance CLOUDY (*)    Hgb urine dipstick SMALL (*)    Ketones, ur 20 (*)    Protein, ur 100 (*)    Nitrite POSITIVE (*)    Leukocytes,Ua LARGE (*)    Bacteria, UA RARE (*)    All other components within normal limits  URINE CULTURE  CULTURE, BLOOD (ROUTINE X 2)  CULTURE, BLOOD (ROUTINE X 2)  MAGNESIUM  LACTIC ACID, PLASMA  LACTIC ACID, PLASMA     EKG:  EKG Interpretation Date/Time:  Thursday July 13 2023 20:08:31 EDT Ventricular Rate:  102 PR Interval:  140 QRS Duration:  86 QT Interval:  340 QTC Calculation: 443 R Axis:   -20  Text Interpretation: Sinus tachycardia Low voltage QRS Cannot rule out Anterior infarct , age undetermined Abnormal ECG When compared with ECG of 24-Mar-2023 13:46, PREVIOUS ECG IS PRESENT Confirmed by Verneda Golder [phone]) on 07/14/2023 1:03:30 AM         RADIOLOGY: My personal review and interpretation of imaging:    I have personally reviewed all  radiology reports.   No results found.   PROCEDURES:  Critical  Care performed: Yes, see critical care procedure note(s)   CRITICAL CARE Performed by: Verneda Golder   Total critical care time: 30 minutes  Critical care time was exclusive of separately billable procedures and treating other patients.  Critical care was necessary to treat or prevent imminent or life-threatening deterioration.  Critical care was time spent personally by me on the following activities: development of treatment plan with patient and/or surrogate as well as nursing, discussions with consultants, evaluation of patient's response to treatment, examination of patient, obtaining history from patient or surrogate, ordering and performing treatments and interventions, ordering and review of laboratory studies, ordering and review of radiographic studies, pulse oximetry and re-evaluation of patient's condition.   Aaron Aas1-3 Lead EKG Interpretation  Performed by: Asha Grumbine, Clover Dao, DO Authorized by: Jolena Kittle, Clover Dao, DO     Interpretation: abnormal     ECG rate:  103   ECG rate assessment: tachycardic     Rhythm: sinus tachycardia     Ectopy: none     Conduction: normal       IMPRESSION / MDM / ASSESSMENT AND PLAN / ED COURSE  I reviewed the triage vital signs and the nursing notes.    Patient here with complaints of generalized weakness.  The patient is on the cardiac monitor to evaluate for evidence of arrhythmia and/or significant heart rate changes.   DIFFERENTIAL DIAGNOSIS (includes but not limited to):   Anemia, electrolyte derangement, UTI, dehydration, thyroid  dysfunction   Patient's presentation is most consistent with acute presentation with potential threat to life or bodily function.   PLAN: Workup initiated from triage.  Sodium of 130, potassium of 3.2.  Will give IV fluids, oral replacement for her potassium.  She does have a mild leukocytosis and has been tachycardic here.  Temperature 99.1.  Nontoxic in appearance.  Urine does show a UTI that is nitrite  positive.  Most recent abnormal urine cultures have grown Proteus mirabilis.  Culture pending.  Will give Rocephin .  Have offered admission but patient states she would prefer discharge home.  Will give Zofran  for symptomatic relief.  Will obtain blood cultures.  Patient has previously had bacteremia.  Cultures in February 2025 grew E. coli.   MEDICATIONS GIVEN IN ED: Medications  sodium chloride  0.9 % bolus 1,000 mL (0 mLs Intravenous Stopped 07/14/23 0334)  ondansetron  (ZOFRAN ) injection 4 mg (4 mg Intravenous Given 07/14/23 0117)  cefTRIAXone  (ROCEPHIN ) 2 g in sodium chloride  0.9 % 100 mL IVPB (0 g Intravenous Stopped 07/14/23 0154)  potassium chloride  (KLOR-CON ) packet 40 mEq (40 mEq Oral Given 07/14/23 0117)  acetaminophen  (TYLENOL ) tablet 1,000 mg (1,000 mg Oral Given 07/14/23 0120)     ED COURSE: Lactic normal.  Heart rate has improved.  Have again offered admission but patient declined stating she feels comfortable with plan for discharge home.  Will discharge with oral antibiotics and recommend close outpatient follow-up.   At this time, I do not feel there is any life-threatening condition present. I reviewed all nursing notes, vitals, pertinent previous records.  All lab and urine results, EKGs, imaging ordered have been independently reviewed and interpreted by myself.  I reviewed all available radiology reports from any imaging ordered this visit.  Based on my assessment, I feel the patient is safe to be discharged home without further emergent workup and can continue workup as an outpatient as needed. Discussed all findings, treatment plan as well as usual  and customary return precautions.  They verbalize understanding and are comfortable with this plan.  Outpatient follow-up has been provided as needed.  All questions have been answered.    CONSULTS: Admission offered but patient declines.   OUTSIDE RECORDS REVIEWED: Reviewed last admission note, recent urology notes.       FINAL  CLINICAL IMPRESSION(S) / ED DIAGNOSES   Final diagnoses:  Acute UTI  Generalized weakness     Rx / DC Orders   ED Discharge Orders          Ordered    cephALEXin  (KEFLEX ) 500 MG capsule  2 times daily        07/14/23 1610             Note:  This document was prepared using Dragon voice recognition software and may include unintentional dictation errors.   Oral Remache, Clover Dao, DO 07/14/23 [phone]

## 2023-07-16 LAB — URINE CULTURE: Culture: >=100000 — AB

## 2023-07-17 ENCOUNTER — Other Ambulatory Visit: Payer: Self-pay

## 2023-07-17 ENCOUNTER — Emergency Department
Admission: EM | Admit: 2023-07-17 | Discharge: 2023-07-18 | Disposition: A | Discharge status: Home / Self Care | Attending: Emergency Medicine | Admitting: Emergency Medicine

## 2023-07-17 DIAGNOSIS — L89892 Pressure ulcer of other site, stage 2: Secondary | ICD-10-CM | POA: Diagnosis not present

## 2023-07-17 DIAGNOSIS — R0902 Hypoxemia: Secondary | ICD-10-CM | POA: Diagnosis not present

## 2023-07-17 DIAGNOSIS — T83090A Other mechanical complication of cystostomy catheter, initial encounter: Secondary | ICD-10-CM | POA: Insufficient documentation

## 2023-07-17 DIAGNOSIS — R58 Hemorrhage, not elsewhere classified: Secondary | ICD-10-CM | POA: Diagnosis not present

## 2023-07-17 DIAGNOSIS — Y828 Other medical devices associated with adverse incidents: Secondary | ICD-10-CM | POA: Insufficient documentation

## 2023-07-17 DIAGNOSIS — T83010A Breakdown (mechanical) of cystostomy catheter, initial encounter: Secondary | ICD-10-CM

## 2023-07-17 DIAGNOSIS — L89229 Pressure ulcer of left hip, unspecified stage: Secondary | ICD-10-CM

## 2023-07-17 DIAGNOSIS — Z743 Need for continuous supervision: Secondary | ICD-10-CM | POA: Diagnosis not present

## 2023-07-17 DIAGNOSIS — N390 Urinary tract infection, site not specified: Secondary | ICD-10-CM | POA: Diagnosis not present

## 2023-07-17 DIAGNOSIS — T83091A Other mechanical complication of indwelling urethral catheter, initial encounter: Secondary | ICD-10-CM | POA: Diagnosis not present

## 2023-07-17 DIAGNOSIS — R339 Retention of urine, unspecified: Secondary | ICD-10-CM | POA: Insufficient documentation

## 2023-07-17 DIAGNOSIS — R21 Rash and other nonspecific skin eruption: Secondary | ICD-10-CM | POA: Diagnosis not present

## 2023-07-17 LAB — URINALYSIS, ROUTINE W REFLEX MICROSCOPIC
Bacteria, UA: NONE SEEN
Bilirubin Urine: NEGATIVE
Glucose, UA: NEGATIVE mg/dL
Ketones, ur: NEGATIVE mg/dL
Nitrite: NEGATIVE
Protein, ur: >=300 mg/dL — AB
RBC / HPF: >50 RBC/hpf (ref 0–5)
Specific Gravity, Urine: 1.029 (ref 1.005–1.030)
WBC, UA: >50 WBC/hpf (ref 0–5)
pH: 5 (ref 5.0–8.0)

## 2023-07-17 MED ORDER — CEPHALEXIN 500 MG PO CAPS
500.0000 mg | ORAL_CAPSULE | Freq: Once | ORAL | Status: AC
  Administered 2023-07-17: 500 mg via ORAL
  Filled 2023-07-17: qty 1

## 2023-07-17 NOTE — Discharge Instructions (Signed)
 Pick up the Keflex  antibiotic that you were prescribed and finish the whole course.  Please follow-up with your primary care doctor to be referred to wound care for the pressure ulcer to your left upper thigh.

## 2023-07-17 NOTE — Progress Notes (Signed)
 ED Antimicrobial Stewardship Positive Culture Follow Up   Rebecca [name] is an 63 y.o. female who presented to [facility] on 07/14/2023 with a chief complaint of  Chief Complaint  Patient presents with   Weakness    Recent Results (from the past 720 hours)  Urine Culture     Status: Abnormal   Collection Time: 06/26/23 10:24 PM   Specimen: Urine, Random  Result Value Ref Range Status   Specimen Description   Final    URINE, RANDOM Performed at [facility], [address], [place], [place] [address]    Special Requests   Final    NONE Reflexed from [id number] Performed at [facility], [address] [place], [place] [address]    Culture >=100,000 COLONIES/mL PROTEUS MIRABILIS (A)  Final   Report Status 06/29/2023 FINAL  Final   Organism ID, Bacteria PROTEUS MIRABILIS (A)  Final      Susceptibility   Proteus mirabilis - MIC*    AMPICILLIN <=2 SENSITIVE Sensitive     CEFAZOLIN  <=4 SENSITIVE Sensitive     CEFEPIME  <=0.12 SENSITIVE Sensitive     CEFTRIAXONE  <=0.25 SENSITIVE Sensitive     CIPROFLOXACIN  >=4 RESISTANT Resistant     GENTAMICIN <=1 SENSITIVE Sensitive     IMIPENEM 2 SENSITIVE Sensitive     NITROFURANTOIN  128 RESISTANT Resistant     TRIMETH/SULFA 40 SENSITIVE Sensitive     AMPICILLIN/SULBACTAM <=2 SENSITIVE Sensitive     PIP/TAZO <=4 SENSITIVE Sensitive ug/mL    * >=100,000 COLONIES/mL PROTEUS MIRABILIS  Urine Culture     Status: Abnormal   Collection Time: 07/13/23  8:09 PM   Specimen: Urine, Clean Catch  Result Value Ref Range Status   Specimen Description   Final    URINE, CLEAN CATCH Performed at [facility], [address], [place], [place] [address]    Special Requests   Final    NONE Performed at [facility], [address], [place], [place] [address]    Culture >=100,000 COLONIES/mL ENTEROBACTER CLOACAE (A)  Final   Report Status 07/16/2023 FINAL  Final   Organism ID, Bacteria ENTEROBACTER CLOACAE (A)  Final       Susceptibility   Enterobacter cloacae - MIC*    CEFEPIME  <=0.12 SENSITIVE Sensitive     CIPROFLOXACIN  <=0.25 SENSITIVE Sensitive     GENTAMICIN <=1 SENSITIVE Sensitive     IMIPENEM 0.5 SENSITIVE Sensitive     NITROFURANTOIN  64 INTERMEDIATE Intermediate     TRIMETH/SULFA <=20 SENSITIVE Sensitive     PIP/TAZO <=4 SENSITIVE Sensitive ug/mL    * >=100,000 COLONIES/mL ENTEROBACTER CLOACAE  Blood culture (routine x 2)     Status: None (Preliminary result)   Collection Time: 07/14/23  1:55 AM   Specimen: Left Antecubital; Blood  Result Value Ref Range Status   Specimen Description LEFT ANTECUBITAL  Final   Special Requests   Final    BOTTLES DRAWN AEROBIC AND ANAEROBIC Blood Culture results may not be optimal due to an inadequate volume of blood received in culture bottles   Culture   Final    NO GROWTH 3 DAYS Performed at [facility], [address] [place], [place] [address]    Report Status PENDING  Incomplete  Blood culture (routine x 2)     Status: None (Preliminary result)   Collection Time: 07/14/23  1:56 AM   Specimen: BLOOD LEFT FOREARM  Result Value Ref Range Status   Specimen Description BLOOD LEFT FOREARM  Final  Special Requests   Final    BOTTLES DRAWN AEROBIC AND ANAEROBIC Blood Culture results may not be optimal due to an inadequate volume of blood received in culture bottles   Culture   Final    NO GROWTH 3 DAYS Performed at [facility], [address], [place], [place] [address]    Report Status PENDING  Incomplete    [x]  Treated with cephalexin , organism resistant to prescribed antimicrobial  New antibiotic prescription: ciprofloxacin  500 mg po BID x 5 days called in to [facility] [address] [facility] (reviewed by phone w/ patient)  ED Provider: Marolyn Sis, MD   Rebecca [name] 07/17/2023, 1:35 PM Clinical Pharmacist

## 2023-07-17 NOTE — ED Triage Notes (Signed)
 Pt presents from home via EMS c/o leaking suprapubic urinary catheter x2 days. Reports leaking around insertion site.

## 2023-07-17 NOTE — ED Provider Notes (Addendum)
 [facility] Provider Note    Event Date/Time   First MD Initiated Contact with Patient 07/17/23 2134     (approximate)   History   Urinary Retention   HPI  Rebecca [name] is a 63 y.o. female who presents to the ED for evaluation of Urinary Retention   Review a urology clinic visit from 6 days ago.  History of neurogenic bladder with suprapubic catheter initially placed in February, since exchanged twice with IR as recently as 5/2.  16 French catheter ED visit on 6/6, urine culture with E. coli and she was discharged with Keflex   Patient presents to the ED due to concern for suprapubic catheter not draining normally.  Reports that she woke up and had urine soaking the sheets behind her and her catheter bag was only mildly full.  No abdominal pain, nausea, vomiting, fevers   Physical Exam   Triage Vital Signs: ED Triage Vitals  Encounter Vitals Group     BP      Systolic BP Percentile      Diastolic BP Percentile      Pulse      Resp      Temp      Temp src      SpO2      Weight      Height      Head Circumference      Peak Flow      Pain Score      Pain Loc      Pain Education      Exclude from Growth Chart     Most recent vital signs: Vitals:   07/17/23 2137  BP: (!) 142/70  Pulse: 90  Resp: 18  Temp: 98.1 F (36.7 C)  SpO2: 100%    General: Awake, no distress.  CV:  Good peripheral perfusion.  Resp:  Normal effort.  Abd:  No distention.  Suprapubic catheter with clean insertion site and soft/benign abdomen MSK:  No deformity noted.  Neuro:  No focal deficits appreciated. Other:     ED Results / Procedures / Treatments   Labs (all labs ordered are listed, but only abnormal results are displayed) Labs Reviewed  URINALYSIS, ROUTINE W REFLEX MICROSCOPIC - Abnormal; Notable for the following components:      Result Value   Color, Urine YELLOW (*)    APPearance CLOUDY (*)    Hgb urine dipstick LARGE (*)    Protein, ur  >=300 (*)    Leukocytes,Ua LARGE (*)    All other components within normal limits  URINE CULTURE    EKG   RADIOLOGY   Official radiology report(s): No results found.  PROCEDURES and INTERVENTIONS:  Procedures  Medications  cephALEXin  (KEFLEX ) capsule 500 mg (500 mg Oral Given 07/17/23 2246)     IMPRESSION / MDM / ASSESSMENT AND PLAN / ED COURSE  I reviewed the triage vital signs and the nursing notes.  Differential diagnosis includes, but is not limited to, cystitis, sediment in the urine, hardware malfunction  Patient presents with suprapubic catheter poorly draining due to sediment in the setting of a recently diagnosed cystitis.  No systemic symptoms and she looks well.  Will irrigate her catheter to hopefully regain functionality, send her urine for a fresh culture.  She has her antibiotics at home that she just started.  No signs of sepsis.  With irrigation, unfortunately still nondraining catheter.  Tract has matured for nearly 4 months.  And most recently catheter  was replaced in the clinic, we will plan to replace that here and as long as it is functional, she will be suitable for continued outpatient management.  Clinical Course as of 07/17/23 2323  Mon Jul 17, 2023  2241 I am able to flush the suprapubic catheter but unable to draw any fluid back.  Discussed with the patient and she is agreeable with me replacing this catheter with a new 1. [DS]  2322 I replace with a 16Fr foley, smooth placement. Flushes and draws now [DS]    Clinical Course User Index [DS] Arline Bennett, MD     FINAL CLINICAL IMPRESSION(S) / ED DIAGNOSES   Final diagnoses:  Suprapubic catheter dysfunction, initial encounter [facility])     Rx / DC Orders   ED Discharge Orders     None        Note:  This document was prepared using Dragon voice recognition software and may include unintentional dictation errors.   Arline Bennett, MD 07/17/23 2228    Arline Bennett, MD 07/17/23 Allyson Ares     Arline Bennett, MD 07/17/23 [phone]

## 2023-07-18 ENCOUNTER — Encounter: Payer: Self-pay | Admitting: Family Medicine

## 2023-07-18 DIAGNOSIS — Z743 Need for continuous supervision: Secondary | ICD-10-CM | POA: Diagnosis not present

## 2023-07-18 DIAGNOSIS — G35 Multiple sclerosis: Secondary | ICD-10-CM | POA: Diagnosis not present

## 2023-07-18 DIAGNOSIS — T83090A Other mechanical complication of cystostomy catheter, initial encounter: Secondary | ICD-10-CM | POA: Diagnosis not present

## 2023-07-18 MED ORDER — BACITRACIN ZINC 500 UNIT/GM EX OINT
TOPICAL_OINTMENT | Freq: Once | CUTANEOUS | Status: AC
  Administered 2023-07-18: 1 via TOPICAL
  Filled 2023-07-18: qty 1.8

## 2023-07-18 NOTE — ED Provider Notes (Signed)
 12:20 AM  Assumed care at shift change.  Patient here needing suprapubic catheter exchange.  Prior to discharge patient is asking for physician to evaluate a wound that has been present to her left thigh for weeks.  She has a 4 x 5 cm stage II pressure ulcer to the left inner thigh without signs of surrounding infection.  Will clean this area, apply dressing and have recommended that she follow-up with her primary care doctor to be referred to wound clinic.  She verbalized understanding.   Demarko Zeimet, Clover Dao, DO 07/18/23 [phone]

## 2023-07-18 NOTE — ED Notes (Signed)
 [facility] was called to transport patient home spoke with Oralee Billow

## 2023-07-19 LAB — CULTURE, BLOOD (ROUTINE X 2)
Culture: NO GROWTH
Culture: NO GROWTH

## 2023-07-19 LAB — URINE CULTURE

## 2023-07-21 DIAGNOSIS — Z9049 Acquired absence of other specified parts of digestive tract: Secondary | ICD-10-CM | POA: Diagnosis not present

## 2023-07-21 DIAGNOSIS — M4802 Spinal stenosis, cervical region: Secondary | ICD-10-CM | POA: Diagnosis not present

## 2023-07-21 DIAGNOSIS — I5032 Chronic diastolic (congestive) heart failure: Secondary | ICD-10-CM | POA: Diagnosis not present

## 2023-07-21 DIAGNOSIS — G894 Chronic pain syndrome: Secondary | ICD-10-CM | POA: Diagnosis not present

## 2023-07-21 DIAGNOSIS — N10 Acute tubulo-interstitial nephritis: Secondary | ICD-10-CM | POA: Diagnosis not present

## 2023-07-21 DIAGNOSIS — K581 Irritable bowel syndrome with constipation: Secondary | ICD-10-CM | POA: Diagnosis not present

## 2023-07-21 DIAGNOSIS — K8689 Other specified diseases of pancreas: Secondary | ICD-10-CM | POA: Diagnosis not present

## 2023-07-21 DIAGNOSIS — I081 Rheumatic disorders of both mitral and tricuspid valves: Secondary | ICD-10-CM | POA: Diagnosis not present

## 2023-07-21 DIAGNOSIS — E871 Hypo-osmolality and hyponatremia: Secondary | ICD-10-CM | POA: Diagnosis not present

## 2023-07-21 DIAGNOSIS — N319 Neuromuscular dysfunction of bladder, unspecified: Secondary | ICD-10-CM | POA: Diagnosis not present

## 2023-07-21 DIAGNOSIS — R1310 Dysphagia, unspecified: Secondary | ICD-10-CM | POA: Diagnosis not present

## 2023-07-21 DIAGNOSIS — I890 Lymphedema, not elsewhere classified: Secondary | ICD-10-CM | POA: Diagnosis not present

## 2023-07-21 DIAGNOSIS — Z435 Encounter for attention to cystostomy: Secondary | ICD-10-CM | POA: Diagnosis not present

## 2023-07-21 DIAGNOSIS — D649 Anemia, unspecified: Secondary | ICD-10-CM | POA: Diagnosis not present

## 2023-07-21 DIAGNOSIS — T83511A Infection and inflammatory reaction due to indwelling urethral catheter, initial encounter: Secondary | ICD-10-CM | POA: Diagnosis not present

## 2023-07-21 DIAGNOSIS — Z853 Personal history of malignant neoplasm of breast: Secondary | ICD-10-CM | POA: Diagnosis not present

## 2023-07-21 DIAGNOSIS — G8220 Paraplegia, unspecified: Secondary | ICD-10-CM | POA: Diagnosis not present

## 2023-07-21 DIAGNOSIS — K219 Gastro-esophageal reflux disease without esophagitis: Secondary | ICD-10-CM | POA: Diagnosis not present

## 2023-07-21 DIAGNOSIS — H900 Conductive hearing loss, bilateral: Secondary | ICD-10-CM | POA: Diagnosis not present

## 2023-07-21 DIAGNOSIS — G35 Multiple sclerosis: Secondary | ICD-10-CM | POA: Diagnosis not present

## 2023-07-21 DIAGNOSIS — E876 Hypokalemia: Secondary | ICD-10-CM | POA: Diagnosis not present

## 2023-07-25 ENCOUNTER — Ambulatory Visit: Payer: Self-pay

## 2023-07-25 ENCOUNTER — Encounter: Payer: Self-pay | Admitting: Family Medicine

## 2023-07-25 ENCOUNTER — Telehealth (INDEPENDENT_AMBULATORY_CARE_PROVIDER_SITE_OTHER): Admitting: Family Medicine

## 2023-07-25 DIAGNOSIS — Z7401 Bed confinement status: Secondary | ICD-10-CM

## 2023-07-25 DIAGNOSIS — L03314 Cellulitis of groin: Secondary | ICD-10-CM | POA: Diagnosis not present

## 2023-07-25 DIAGNOSIS — L89229 Pressure ulcer of left hip, unspecified stage: Secondary | ICD-10-CM

## 2023-07-25 DIAGNOSIS — G8220 Paraplegia, unspecified: Secondary | ICD-10-CM | POA: Diagnosis not present

## 2023-07-25 DIAGNOSIS — G35 Multiple sclerosis: Secondary | ICD-10-CM

## 2023-07-25 MED ORDER — FLUCONAZOLE 150 MG PO TABS: ORAL_TABLET | ORAL | 0 refills | Status: AC

## 2023-07-25 MED ORDER — DOXYCYCLINE HYCLATE 100 MG PO TABS
100.0000 mg | ORAL_TABLET | Freq: Two times a day (BID) | ORAL | 0 refills | Status: AC
Start: 2023-07-25 — End: 2023-08-04

## 2023-07-25 NOTE — Progress Notes (Signed)
 Name: Rebecca [name]   MRN: [id number]    DOB: 16-Jun-1960   Date:07/25/2023       Progress Note  Subjective:    Chief Complaint  Chief Complaint  Patient presents with   Referral    Wound care. Upper L thigh near vaginal area. Painful    I connected with  SAFIRE GORDIN  on 07/25/23 at 11:20 AM EDT by a video enabled telemedicine application and verified that I am speaking with the correct person using two identifiers.  I discussed the limitations of evaluation and management by telemedicine and the availability of in person appointments. The patient expressed understanding and agreed to proceed. Staff also discussed with the patient that there may be a patient responsible charge related to this service. Patient Location: home Provider Location: [facility]  Additional Individuals present: none  HPI Wounds in between groin and thigh, noted with ED visit on 6/6 and 6/9 Pt has patient care aid who has helped to apply ointment per ED and bandages, but it is not healing. She is asking for referral to wound specialists Hx of suprapubic catheter, MS bedbound Pt has not been able to come into clinic in some time, last OV in person was  11/2022 for CPE with float provider, prior to that last in person OV with me was May 2024, she has to come on a gurney, she had a fx in 2022 then a prolonged hospitalization and SNF/rehab for over a year until May 2024 - appt at that time was done with EMS help for transport via gurney into clinic - due to gradual loss of strength she became largely bedbound since 2024 on and has not been into clinic since.     Patient Active Problem List   Diagnosis Date Noted   Bacteremia due to Escherichia coli 03/30/2023   Traumatic injury of bladder 03/30/2023   Complicated UTI (urinary tract infection) 03/24/2023   Obesity (BMI 30-39.9) 03/24/2023   Acute pyelonephritis 03/08/2023   Sepsis (HCC) 03/08/2023   Chronic diastolic CHF (congestive heart failure) (HCC)  03/08/2023   Hyponatremia 03/08/2023   Hypokalemia 03/08/2023   Abnormal LFTs 03/08/2023   Pancreatic insufficiency 12/07/2022   Conductive hearing loss, bilateral 06/29/2021   Tibia/fibula fracture, left, closed, initial encounter 12/08/2020   Dysphagia    Acute esophagitis    Gastric erythema    Mucosal abnormality of duodenum    Schatzki's ring    Diarrhea 04/14/2020   Elevated BP without diagnosis of hypertension 04/14/2020   Neuromuscular dysfunction of bladder, unspecified 03/27/2020   Acquired absence of unspecified breast and nipple 02/08/2020   History of falling 02/08/2020   Personal history of urinary (tract) infections 02/08/2020   Muscle spasm 02/28/2019   Post-menopausal bleeding 11/20/2018   Ambulatory dysfunction 02/02/2018   Abnormality of gait 11/17/2017   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   Bilateral lower extremity pain (primary) (bilateral) (right greater than left) 08/01/2016   Chronic neck pain (secondary) (bilateral) ( left greater than right) 07/28/2016   Neutropenia (HCC) 06/27/2016   Chronic pain syndrome 06/03/2016   Neurogenic bladder    Neurogenic bowel    Detrusor and sphincter dyssynergia    Urinary incontinence    Seizure disorder (HCC)    Slow transit constipation    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HCC)    Lymphedema    History of breast cancer in female 10/28/2015   Anemia 10/28/2015   Thrombocytopenia (HCC) 10/28/2015   Allergic rhinitis 10/28/2015  IBS (irritable bowel syndrome) 10/28/2015   Uterine leiomyoma 10/24/2014   History of right mastectomy 10/24/2014   MS (multiple sclerosis) (HCC) 09/16/2014   Primary cancer of right female breast (HCC) 01/10/2014   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 My mom smoked. Passed 04/18/20. Dad smoked, quit 1973. Sister smokes.  Substance  Use Topics   Alcohol use: No     Current Outpatient Medications:    amantadine  (SYMMETREL ) 100 MG capsule, TAKE 1 CAPSULE BY MOUTH THREE TIMES A DAY, Disp: 270 capsule, Rfl: 1   baclofen  (LIORESAL ) 10 MG tablet, Take 1-2 tablets (10-20 mg total) by mouth 3 (three) times daily., Disp: 120 tablet, Rfl: 12   ciprofloxacin  (CIPRO ) 500 MG tablet, Take 500 mg by mouth 2 (two) times daily., Disp: , Rfl:    gluconic acid-citric acid (RENACIDIN ) irrigation, Insert 30 mL into suprapubic tube and clamp tube for 10 minutes and then drain twice daily, Disp: 500 mL, Rfl: 1   Glucosamine-Chondroitin (COSAMIN DS PO), Take 1 tablet by mouth 3 (three) times daily., Disp: , Rfl:    levETIRAcetam  (KEPPRA ) 500 MG tablet, Take 1 tablet (500 mg total) by mouth 2 (two) times daily., Disp: 180 tablet, Rfl: 4   lipase/protease/amylase (CREON ) 36000 UNITS CPEP capsule, Take 2 capsules (72,000 Units total) by mouth 3 (three) times daily with meals AND 1 capsule (36,000 Units total) with snacks. (Patient taking differently: Take 1-2 capsules (36,000- 72,000 Units total) by mouth with (three) times daily with meals AND 1 capsule (36,000 Units total) with snacks. (1 capsule by mouth with breakfast, 2 capsules by mouth with lunch and 1 capsule by mouth with dinner)  ), Disp: 270 capsule, Rfl: 11   loratadine  (CLARITIN ) 10 MG tablet, Take 10 mg by mouth daily., Disp: , Rfl:    Misc Natural Products (LEG VEIN & CIRCULATION) TABS, Take 1 tablet by mouth 2 (two) times daily. , Disp: , Rfl:    Multiple Vitamins-Minerals (MULTIVITAMIN PO), Take 1 tablet by mouth daily. , Disp: , Rfl:    nystatin  (MYCOSTATIN /NYSTOP ) powder, Apply 1 application topically 3 (three) times daily as needed. For rash/raw skin as needed, Disp: 15 g, Rfl: 2   omeprazole  (PRILOSEC) 40 MG capsule, TAKE 1 CAPSULE (40 MG TOTAL) BY MOUTH DAILY., Disp: 90 capsule, Rfl: 1   oxybutynin  (DITROPAN -XL) 10 MG 24 hr tablet, Take 1 tablet (10 mg total) by mouth 2 (two) times  daily., Disp: 120 tablet, Rfl: 3   REBIF  REBIDOSE 44 MCG/0.5ML SOAJ, Inject 0.5 mLs into the skin 3 (three) times a week., Disp: 18 mL, Rfl: 0   tetracycline  (SUMYCIN ) 500 MG capsule, Take 1 capsule (500 mg total) by mouth 2 (two) times daily., Disp: 20 capsule, Rfl: 0   tiZANidine  (ZANAFLEX ) 4 MG tablet, Take 1 tablet (4 mg total) by mouth at bedtime., Disp: 90 tablet, Rfl: 4   Turmeric 500 MG CAPS, Take 500 mg by mouth daily., Disp: , Rfl:  No current facility-administered medications for this visit.  Facility-Administered Medications Ordered in Other Visits:    cefTRIAXone  (ROCEPHIN ) 2 g in dextrose  5 % 50 mL IVPB, 2 g, Intravenous, Q24H, [place], [place], NP  Allergies  Allergen Reactions   Fentanyl  Nausea And Vomiting and Nausea Only    vomiting Was given in PACU x3 each time patient got sick  Sulfa Antibiotics Hives and Other (See Comments)    Light headed, over heated    Reviewed last several urology visit, ED visit and hospital admission   Review of Systems  Constitutional: Negative.   HENT: Negative.    Eyes: Negative.   Respiratory: Negative.    Cardiovascular: Negative.   Gastrointestinal: Negative.   Endocrine: Negative.   Genitourinary: Negative.   Musculoskeletal: Negative.   Skin:  Positive for wound.  Allergic/Immunologic: Negative.   Neurological: Negative.   Hematological: Negative.   Psychiatric/Behavioral: Negative.    All other systems reviewed and are negative.     Objective:   Virtual encounter, vitals limited, only able to obtain the following There were no vitals filed for this visit. There is no height or weight on file to calculate BMI. Nursing Note and Vital Signs reviewed.  Physical Exam Vitals and nursing note reviewed.  Pulmonary:     Effort: No respiratory distress.   Neurological:     Mental Status: She is alert.     PE limited by virtual encounter  No results found. However, due to the size of the patient record, not  all encounters were searched. Please check Results Review for a complete set of results.  Assessment and Plan:     ICD-10-CM   1. Cellulitis of groin  L03.314 Ambulatory referral to Wound Clinic    Ambulatory referral to Home Health    Wound culture    doxycycline (VIBRA-TABS) 100 MG tablet    fluconazole  (DIFLUCAN ) 150 MG tablet   unable to examine pt or see images, will cover with doxy and diflucan , wound culture sent to [facility], [facility] wound care RN req    2. Pressure ulcer of upper thigh, left, unspecified pressure ulcer stage  L89.229 Ambulatory referral to Wound Clinic    Ambulatory referral to Home Health    Wound culture    3. 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 Bedbound  Z74.01 Ambulatory referral to Wound Clinic    Ambulatory referral to Home Health      Still hoping to get images through pt mychart msg, none received so far  A mychart message with summary was sent to pt at end of business today (5:44 PM ) and pt was encouraged to look at AVS to see orders, meds, referrals. Currently suspect she has personal care aid and not wound care HH RN, so HH orders were put in as well as referral to wound care clinic.  - I discussed the assessment and treatment plan with the patient. The patient was provided an opportunity to ask questions and all were answered. The patient agreed with the plan and demonstrated an understanding of the instructions.  I provided 30+ minutes of non-face-to-face time during this encounter.  Adeline Hone, PA-C 07/25/23 11:38 AM

## 2023-07-25 NOTE — Telephone Encounter (Signed)
 FYI Only or Action Required?: FYI only for provider  Patient was last seen in primary care on 02/20/2023 by Quinton Buckler, FNP. Called Nurse Triage reporting Leg Injury. Symptoms began about a month ago. Interventions attempted: Rest, hydration, or home remedies. Symptoms are: gradually worsening.  Triage Disposition: Information or Advice Only Call  Patient/caregiver understands and will follow disposition?: Yes   Copied from CRM [id number]. Topic: Clinical - Red Word Triage >> Jul 25, 2023  8:14 AM Rebecca [name] wrote: Red Word that prompted transfer to Nurse Triage:  She has a wound on her left upper thigh. It is getting worse. She does have an video appointment today at 11:20 AM. He wants to send a picture before appointment. Reason for Disposition  General information question, no triage required and triager able to answer question  Answer Assessment - Initial Assessment Questions 1. REASON FOR CALL or QUESTION: What is your reason for calling today? or How can I best help you? or What question do you have that I can help answer?     Pt and spouse calling needing assistance to upload a picture to her MyChart for her upcoming virtual visit today. Pt reports this leg wound has been an ongoing issue that has worsened and would like PCP to see current picture.  Triager walked patient thru MyChart to send PCP message. Pt stated that she was able to upload photo via Patient Messages to provider.  Answer Assessment - Initial Assessment Questions 1. MECHANISM: How did the injury happen? (e.g., twisting injury, direct blow)      *No Answer* 2. ONSET: When did the injury happen? (Minutes or hours ago)      > 1 month 3. LOCATION: Where is the injury located?      L thigh - started small, now worsening 4. APPEARANCE of INJURY: What does the injury look like?  (e.g., deformity of leg)     *No Answer* 5. SEVERITY: Can you put weight on that leg? Can you walk?      *No Answer* 6.  SIZE: For cuts, bruises, or swelling, ask: How large is it? (e.g., inches or centimeters)      2-3 inches 7. PAIN: Is there pain? If Yes, ask: How bad is the pain?   What does it keep you from doing? (e.g., Scale 1-10; or mild, moderate, severe)   -  NONE: (0): no pain   -  MILD (1-3): doesn't interfere with normal activities    -  MODERATE (4-7): interferes with normal activities (e.g., work or school) or awakens from sleep, limping    -  SEVERE (8-10): excruciating pain, unable to do any normal activities, unable to walk     *No Answer* 8. TETANUS: For any breaks in the skin, ask: When was the last tetanus booster?     *No Answer* 9. OTHER SYMPTOMS: Do you have any other symptoms?      *No Answer* 10. PREGNANCY: Is there any chance you are pregnant? When was your last menstrual period?       *No Answer*  Protocols used: Leg Injury-A-AH, Information Only Call - No Triage-A-AH

## 2023-07-28 ENCOUNTER — Encounter: Payer: Self-pay | Admitting: Family Medicine

## 2023-07-28 ENCOUNTER — Telehealth: Payer: Self-pay

## 2023-07-28 DIAGNOSIS — G8220 Paraplegia, unspecified: Secondary | ICD-10-CM | POA: Diagnosis not present

## 2023-07-28 DIAGNOSIS — T83511A Infection and inflammatory reaction due to indwelling urethral catheter, initial encounter: Secondary | ICD-10-CM | POA: Diagnosis not present

## 2023-07-28 DIAGNOSIS — D649 Anemia, unspecified: Secondary | ICD-10-CM | POA: Diagnosis not present

## 2023-07-28 DIAGNOSIS — K581 Irritable bowel syndrome with constipation: Secondary | ICD-10-CM | POA: Diagnosis not present

## 2023-07-28 DIAGNOSIS — N10 Acute tubulo-interstitial nephritis: Secondary | ICD-10-CM | POA: Diagnosis not present

## 2023-07-28 DIAGNOSIS — N319 Neuromuscular dysfunction of bladder, unspecified: Secondary | ICD-10-CM | POA: Diagnosis not present

## 2023-07-28 DIAGNOSIS — G894 Chronic pain syndrome: Secondary | ICD-10-CM | POA: Diagnosis not present

## 2023-07-28 DIAGNOSIS — H900 Conductive hearing loss, bilateral: Secondary | ICD-10-CM | POA: Diagnosis not present

## 2023-07-28 DIAGNOSIS — Z853 Personal history of malignant neoplasm of breast: Secondary | ICD-10-CM | POA: Diagnosis not present

## 2023-07-28 DIAGNOSIS — M4802 Spinal stenosis, cervical region: Secondary | ICD-10-CM | POA: Diagnosis not present

## 2023-07-28 DIAGNOSIS — R1310 Dysphagia, unspecified: Secondary | ICD-10-CM | POA: Diagnosis not present

## 2023-07-28 DIAGNOSIS — E871 Hypo-osmolality and hyponatremia: Secondary | ICD-10-CM | POA: Diagnosis not present

## 2023-07-28 DIAGNOSIS — I081 Rheumatic disorders of both mitral and tricuspid valves: Secondary | ICD-10-CM | POA: Diagnosis not present

## 2023-07-28 DIAGNOSIS — G35 Multiple sclerosis: Secondary | ICD-10-CM | POA: Diagnosis not present

## 2023-07-28 DIAGNOSIS — I5032 Chronic diastolic (congestive) heart failure: Secondary | ICD-10-CM | POA: Diagnosis not present

## 2023-07-28 DIAGNOSIS — I890 Lymphedema, not elsewhere classified: Secondary | ICD-10-CM | POA: Diagnosis not present

## 2023-07-28 DIAGNOSIS — Z9049 Acquired absence of other specified parts of digestive tract: Secondary | ICD-10-CM | POA: Diagnosis not present

## 2023-07-28 DIAGNOSIS — K8689 Other specified diseases of pancreas: Secondary | ICD-10-CM | POA: Diagnosis not present

## 2023-07-28 DIAGNOSIS — E876 Hypokalemia: Secondary | ICD-10-CM | POA: Diagnosis not present

## 2023-07-28 DIAGNOSIS — Z435 Encounter for attention to cystostomy: Secondary | ICD-10-CM | POA: Diagnosis not present

## 2023-07-28 DIAGNOSIS — K219 Gastro-esophageal reflux disease without esophagitis: Secondary | ICD-10-CM | POA: Diagnosis not present

## 2023-07-28 NOTE — Telephone Encounter (Unsigned)
 Copied from CRM [id number]. Topic: Clinical - Order For Equipment >> Jul 28, 2023 11:01 AM Antwanette L wrote: Reason for CRM: Debria Fang a occupational therapist with [facility] is calling to see if Adeline Hone can write an order for a hospital bed and hoyer lift. The patient is confined to her recliner and its causing pressure area. Debria Fang  can be contacted at [phone]

## 2023-07-28 NOTE — Telephone Encounter (Unsigned)
 Copied from CRM [id number]. Topic: Clinical - Medical Advice >> Jul 28, 2023 11:05 AM Antwanette L wrote: Reason for CRM: Debria Fang an occupational therapist from [facility] is calling to let Adeline Hone know that the patient bp today was 160/90. The patient has no symptoms

## 2023-07-31 DIAGNOSIS — G8220 Paraplegia, unspecified: Secondary | ICD-10-CM | POA: Diagnosis not present

## 2023-07-31 DIAGNOSIS — K581 Irritable bowel syndrome with constipation: Secondary | ICD-10-CM | POA: Diagnosis not present

## 2023-07-31 DIAGNOSIS — I890 Lymphedema, not elsewhere classified: Secondary | ICD-10-CM | POA: Diagnosis not present

## 2023-07-31 DIAGNOSIS — S31109D Unspecified open wound of abdominal wall, unspecified quadrant without penetration into peritoneal cavity, subsequent encounter: Secondary | ICD-10-CM | POA: Diagnosis not present

## 2023-07-31 DIAGNOSIS — N390 Urinary tract infection, site not specified: Secondary | ICD-10-CM | POA: Diagnosis not present

## 2023-07-31 DIAGNOSIS — G35 Multiple sclerosis: Secondary | ICD-10-CM | POA: Diagnosis not present

## 2023-07-31 DIAGNOSIS — R1310 Dysphagia, unspecified: Secondary | ICD-10-CM | POA: Diagnosis not present

## 2023-07-31 DIAGNOSIS — G894 Chronic pain syndrome: Secondary | ICD-10-CM | POA: Diagnosis not present

## 2023-07-31 DIAGNOSIS — K219 Gastro-esophageal reflux disease without esophagitis: Secondary | ICD-10-CM | POA: Diagnosis not present

## 2023-07-31 DIAGNOSIS — T83511A Infection and inflammatory reaction due to indwelling urethral catheter, initial encounter: Secondary | ICD-10-CM | POA: Diagnosis not present

## 2023-07-31 DIAGNOSIS — I081 Rheumatic disorders of both mitral and tricuspid valves: Secondary | ICD-10-CM | POA: Diagnosis not present

## 2023-07-31 DIAGNOSIS — D649 Anemia, unspecified: Secondary | ICD-10-CM | POA: Diagnosis not present

## 2023-07-31 DIAGNOSIS — Z9181 History of falling: Secondary | ICD-10-CM | POA: Diagnosis not present

## 2023-07-31 DIAGNOSIS — H900 Conductive hearing loss, bilateral: Secondary | ICD-10-CM | POA: Diagnosis not present

## 2023-07-31 DIAGNOSIS — M4802 Spinal stenosis, cervical region: Secondary | ICD-10-CM | POA: Diagnosis not present

## 2023-07-31 DIAGNOSIS — Z9049 Acquired absence of other specified parts of digestive tract: Secondary | ICD-10-CM | POA: Diagnosis not present

## 2023-07-31 DIAGNOSIS — K8689 Other specified diseases of pancreas: Secondary | ICD-10-CM | POA: Diagnosis not present

## 2023-07-31 DIAGNOSIS — Z435 Encounter for attention to cystostomy: Secondary | ICD-10-CM | POA: Diagnosis not present

## 2023-07-31 DIAGNOSIS — I5032 Chronic diastolic (congestive) heart failure: Secondary | ICD-10-CM | POA: Diagnosis not present

## 2023-07-31 DIAGNOSIS — N319 Neuromuscular dysfunction of bladder, unspecified: Secondary | ICD-10-CM | POA: Diagnosis not present

## 2023-07-31 DIAGNOSIS — Z853 Personal history of malignant neoplasm of breast: Secondary | ICD-10-CM | POA: Diagnosis not present

## 2023-07-31 NOTE — Telephone Encounter (Signed)
 Order placed

## 2023-08-01 ENCOUNTER — Telehealth: Payer: Self-pay

## 2023-08-01 ENCOUNTER — Ambulatory Visit (INDEPENDENT_AMBULATORY_CARE_PROVIDER_SITE_OTHER): Admitting: Physician Assistant

## 2023-08-01 ENCOUNTER — Encounter: Payer: Self-pay | Admitting: Physician Assistant

## 2023-08-01 VITALS — BP 130/76 | HR 87 | Ht 64.0 in | Wt 205.0 lb

## 2023-08-01 DIAGNOSIS — I081 Rheumatic disorders of both mitral and tricuspid valves: Secondary | ICD-10-CM | POA: Diagnosis not present

## 2023-08-01 DIAGNOSIS — T83511A Infection and inflammatory reaction due to indwelling urethral catheter, initial encounter: Secondary | ICD-10-CM | POA: Diagnosis not present

## 2023-08-01 DIAGNOSIS — Z435 Encounter for attention to cystostomy: Secondary | ICD-10-CM

## 2023-08-01 DIAGNOSIS — Z9049 Acquired absence of other specified parts of digestive tract: Secondary | ICD-10-CM | POA: Diagnosis not present

## 2023-08-01 DIAGNOSIS — K8689 Other specified diseases of pancreas: Secondary | ICD-10-CM | POA: Diagnosis not present

## 2023-08-01 DIAGNOSIS — S31109D Unspecified open wound of abdominal wall, unspecified quadrant without penetration into peritoneal cavity, subsequent encounter: Secondary | ICD-10-CM | POA: Diagnosis not present

## 2023-08-01 DIAGNOSIS — K219 Gastro-esophageal reflux disease without esophagitis: Secondary | ICD-10-CM | POA: Diagnosis not present

## 2023-08-01 DIAGNOSIS — N390 Urinary tract infection, site not specified: Secondary | ICD-10-CM | POA: Diagnosis not present

## 2023-08-01 DIAGNOSIS — G894 Chronic pain syndrome: Secondary | ICD-10-CM | POA: Diagnosis not present

## 2023-08-01 DIAGNOSIS — R1310 Dysphagia, unspecified: Secondary | ICD-10-CM | POA: Diagnosis not present

## 2023-08-01 DIAGNOSIS — G35 Multiple sclerosis: Secondary | ICD-10-CM | POA: Diagnosis not present

## 2023-08-01 DIAGNOSIS — M4802 Spinal stenosis, cervical region: Secondary | ICD-10-CM | POA: Diagnosis not present

## 2023-08-01 DIAGNOSIS — N319 Neuromuscular dysfunction of bladder, unspecified: Secondary | ICD-10-CM | POA: Diagnosis not present

## 2023-08-01 DIAGNOSIS — H900 Conductive hearing loss, bilateral: Secondary | ICD-10-CM | POA: Diagnosis not present

## 2023-08-01 DIAGNOSIS — D649 Anemia, unspecified: Secondary | ICD-10-CM | POA: Diagnosis not present

## 2023-08-01 DIAGNOSIS — I890 Lymphedema, not elsewhere classified: Secondary | ICD-10-CM | POA: Diagnosis not present

## 2023-08-01 DIAGNOSIS — Z853 Personal history of malignant neoplasm of breast: Secondary | ICD-10-CM | POA: Diagnosis not present

## 2023-08-01 DIAGNOSIS — G8220 Paraplegia, unspecified: Secondary | ICD-10-CM | POA: Diagnosis not present

## 2023-08-01 DIAGNOSIS — K581 Irritable bowel syndrome with constipation: Secondary | ICD-10-CM | POA: Diagnosis not present

## 2023-08-01 DIAGNOSIS — I5032 Chronic diastolic (congestive) heart failure: Secondary | ICD-10-CM | POA: Diagnosis not present

## 2023-08-01 DIAGNOSIS — Z9181 History of falling: Secondary | ICD-10-CM | POA: Diagnosis not present

## 2023-08-01 NOTE — Progress Notes (Signed)
 Suprapubic Cath Change  Patient is present today for a suprapubic catheter change due to urinary retention.  5ml of water was drained from the balloon, a 16FR foley cath was removed from the tract with out difficulty.  Site was cleaned and prepped in a sterile fashion with betadine.  A 18FR Silastic foley cath was replaced into the tract no complications were noted. Urine return was not noted, 10 ml of sterile water was inflated into the balloon and a night bag was attached for drainage.  Patient tolerated well.     Performed by: Jaysin Gayler, PA-C and Laymon Ned, CMA  Follow up: Return in about 4 weeks (around 08/29/2023) for SPT exchange.

## 2023-08-01 NOTE — Telephone Encounter (Signed)
 Copied from CRM [id number]. Topic: Clinical - Home Health Verbal Orders >> Aug 01, 2023 12:25 PM DeAngela L wrote: Caller/Agency: Phu calling with [employer] Number: [phone] secured line  Service Requested: Physical Therapy Frequency: 1w9  Any new concerns about the patient? No Physical therapist would like to place an order for a hospital bed and a hoyer lift

## 2023-08-03 ENCOUNTER — Telehealth: Payer: Self-pay

## 2023-08-03 DIAGNOSIS — N3289 Other specified disorders of bladder: Secondary | ICD-10-CM

## 2023-08-03 DIAGNOSIS — N3941 Urge incontinence: Secondary | ICD-10-CM

## 2023-08-03 MED ORDER — GEMTESA 75 MG PO TABS: 75.0000 mg | ORAL_TABLET | Freq: Every day | ORAL | 0 refills | Status: AC

## 2023-08-03 NOTE — Telephone Encounter (Signed)
 Phu VO given

## 2023-08-03 NOTE — Telephone Encounter (Signed)
 Patient called stating she was here on 08/01/23 for SPT cath change, this morning she woke up with her cath bag full of urine at 9:15 am. Since then she has felt some lower abdominal pressure and has had urinary incontinence from her urethra almost every 15 minutes, she has urine draining in her catheter bag a little bit. No pain, no blood. She is not able to control the leakage. Spoke with Sam, advised patient that it sounds like patient is having bladder spasms, advised to stop Oxybutynin  and start Gemtesa-samples placed up front to try. Patient was advised to let us  know in a couple weeks or so if she is tolerating medication well before we send RX in to the pharmacy.

## 2023-08-04 ENCOUNTER — Ambulatory Visit: Payer: 59 | Admitting: Oncology

## 2023-08-04 ENCOUNTER — Other Ambulatory Visit: Payer: 59

## 2023-08-04 DIAGNOSIS — Z853 Personal history of malignant neoplasm of breast: Secondary | ICD-10-CM | POA: Diagnosis not present

## 2023-08-04 DIAGNOSIS — T83511A Infection and inflammatory reaction due to indwelling urethral catheter, initial encounter: Secondary | ICD-10-CM | POA: Diagnosis not present

## 2023-08-04 DIAGNOSIS — K581 Irritable bowel syndrome with constipation: Secondary | ICD-10-CM | POA: Diagnosis not present

## 2023-08-04 DIAGNOSIS — G8220 Paraplegia, unspecified: Secondary | ICD-10-CM | POA: Diagnosis not present

## 2023-08-04 DIAGNOSIS — Z9049 Acquired absence of other specified parts of digestive tract: Secondary | ICD-10-CM | POA: Diagnosis not present

## 2023-08-04 DIAGNOSIS — N319 Neuromuscular dysfunction of bladder, unspecified: Secondary | ICD-10-CM | POA: Diagnosis not present

## 2023-08-04 DIAGNOSIS — H900 Conductive hearing loss, bilateral: Secondary | ICD-10-CM | POA: Diagnosis not present

## 2023-08-04 DIAGNOSIS — Z9181 History of falling: Secondary | ICD-10-CM | POA: Diagnosis not present

## 2023-08-04 DIAGNOSIS — S31109D Unspecified open wound of abdominal wall, unspecified quadrant without penetration into peritoneal cavity, subsequent encounter: Secondary | ICD-10-CM | POA: Diagnosis not present

## 2023-08-04 DIAGNOSIS — R1310 Dysphagia, unspecified: Secondary | ICD-10-CM | POA: Diagnosis not present

## 2023-08-04 DIAGNOSIS — G35 Multiple sclerosis: Secondary | ICD-10-CM | POA: Diagnosis not present

## 2023-08-04 DIAGNOSIS — D649 Anemia, unspecified: Secondary | ICD-10-CM | POA: Diagnosis not present

## 2023-08-04 DIAGNOSIS — K8689 Other specified diseases of pancreas: Secondary | ICD-10-CM | POA: Diagnosis not present

## 2023-08-04 DIAGNOSIS — K219 Gastro-esophageal reflux disease without esophagitis: Secondary | ICD-10-CM | POA: Diagnosis not present

## 2023-08-04 DIAGNOSIS — Z435 Encounter for attention to cystostomy: Secondary | ICD-10-CM | POA: Diagnosis not present

## 2023-08-04 DIAGNOSIS — I5032 Chronic diastolic (congestive) heart failure: Secondary | ICD-10-CM | POA: Diagnosis not present

## 2023-08-04 DIAGNOSIS — N390 Urinary tract infection, site not specified: Secondary | ICD-10-CM | POA: Diagnosis not present

## 2023-08-04 DIAGNOSIS — M4802 Spinal stenosis, cervical region: Secondary | ICD-10-CM | POA: Diagnosis not present

## 2023-08-04 DIAGNOSIS — I890 Lymphedema, not elsewhere classified: Secondary | ICD-10-CM | POA: Diagnosis not present

## 2023-08-04 DIAGNOSIS — I081 Rheumatic disorders of both mitral and tricuspid valves: Secondary | ICD-10-CM | POA: Diagnosis not present

## 2023-08-04 DIAGNOSIS — G894 Chronic pain syndrome: Secondary | ICD-10-CM | POA: Diagnosis not present

## 2023-08-07 DIAGNOSIS — K219 Gastro-esophageal reflux disease without esophagitis: Secondary | ICD-10-CM | POA: Diagnosis not present

## 2023-08-07 DIAGNOSIS — R1310 Dysphagia, unspecified: Secondary | ICD-10-CM | POA: Diagnosis not present

## 2023-08-07 DIAGNOSIS — N390 Urinary tract infection, site not specified: Secondary | ICD-10-CM | POA: Diagnosis not present

## 2023-08-07 DIAGNOSIS — Z435 Encounter for attention to cystostomy: Secondary | ICD-10-CM | POA: Diagnosis not present

## 2023-08-07 DIAGNOSIS — N319 Neuromuscular dysfunction of bladder, unspecified: Secondary | ICD-10-CM | POA: Diagnosis not present

## 2023-08-07 DIAGNOSIS — S31109D Unspecified open wound of abdominal wall, unspecified quadrant without penetration into peritoneal cavity, subsequent encounter: Secondary | ICD-10-CM | POA: Diagnosis not present

## 2023-08-07 DIAGNOSIS — K581 Irritable bowel syndrome with constipation: Secondary | ICD-10-CM | POA: Diagnosis not present

## 2023-08-07 DIAGNOSIS — G8220 Paraplegia, unspecified: Secondary | ICD-10-CM | POA: Diagnosis not present

## 2023-08-07 DIAGNOSIS — Z853 Personal history of malignant neoplasm of breast: Secondary | ICD-10-CM | POA: Diagnosis not present

## 2023-08-07 DIAGNOSIS — Z9181 History of falling: Secondary | ICD-10-CM | POA: Diagnosis not present

## 2023-08-07 DIAGNOSIS — M4802 Spinal stenosis, cervical region: Secondary | ICD-10-CM | POA: Diagnosis not present

## 2023-08-07 DIAGNOSIS — K8689 Other specified diseases of pancreas: Secondary | ICD-10-CM | POA: Diagnosis not present

## 2023-08-07 DIAGNOSIS — I081 Rheumatic disorders of both mitral and tricuspid valves: Secondary | ICD-10-CM | POA: Diagnosis not present

## 2023-08-07 DIAGNOSIS — Z9049 Acquired absence of other specified parts of digestive tract: Secondary | ICD-10-CM | POA: Diagnosis not present

## 2023-08-07 DIAGNOSIS — I890 Lymphedema, not elsewhere classified: Secondary | ICD-10-CM | POA: Diagnosis not present

## 2023-08-07 DIAGNOSIS — G35 Multiple sclerosis: Secondary | ICD-10-CM | POA: Diagnosis not present

## 2023-08-07 DIAGNOSIS — H900 Conductive hearing loss, bilateral: Secondary | ICD-10-CM | POA: Diagnosis not present

## 2023-08-07 DIAGNOSIS — G894 Chronic pain syndrome: Secondary | ICD-10-CM | POA: Diagnosis not present

## 2023-08-07 DIAGNOSIS — D649 Anemia, unspecified: Secondary | ICD-10-CM | POA: Diagnosis not present

## 2023-08-07 DIAGNOSIS — I5032 Chronic diastolic (congestive) heart failure: Secondary | ICD-10-CM | POA: Diagnosis not present

## 2023-08-07 DIAGNOSIS — T83511A Infection and inflammatory reaction due to indwelling urethral catheter, initial encounter: Secondary | ICD-10-CM | POA: Diagnosis not present

## 2023-08-08 ENCOUNTER — Ambulatory Visit: Payer: Self-pay | Admitting: Internal Medicine

## 2023-08-09 ENCOUNTER — Other Ambulatory Visit: Payer: Self-pay | Admitting: Family Medicine

## 2023-08-09 DIAGNOSIS — K219 Gastro-esophageal reflux disease without esophagitis: Secondary | ICD-10-CM

## 2023-08-09 DIAGNOSIS — Z760 Encounter for issue of repeat prescription: Secondary | ICD-10-CM

## 2023-08-10 ENCOUNTER — Other Ambulatory Visit: Payer: Self-pay | Admitting: *Deleted

## 2023-08-10 DIAGNOSIS — C50911 Malignant neoplasm of unspecified site of right female breast: Secondary | ICD-10-CM

## 2023-08-10 NOTE — Telephone Encounter (Signed)
 Requested Prescriptions  Pending Prescriptions Disp Refills   omeprazole  (PRILOSEC) 40 MG capsule [Pharmacy Med Name: OMEPRAZOLE  DR 40 MG CAPSULE] 90 capsule 1    Sig: TAKE 1 CAPSULE (40 MG TOTAL) BY MOUTH DAILY.     Gastroenterology: Proton Pump Inhibitors Passed - 08/10/2023  4:57 PM      Passed - Valid encounter within last 12 months    Recent Outpatient Visits           2 weeks ago Cellulitis of groin   [facility] Leavy Mole, PA-C       Future Appointments             In 2 weeks McGowan, Rebecca [name] [facility]   In 2 weeks Rebecca Fend, DO [facility]

## 2023-08-10 NOTE — Progress Notes (Signed)
 cbc

## 2023-08-14 ENCOUNTER — Inpatient Hospital Stay: Admitting: Oncology

## 2023-08-14 ENCOUNTER — Inpatient Hospital Stay

## 2023-08-15 DIAGNOSIS — G8220 Paraplegia, unspecified: Secondary | ICD-10-CM | POA: Diagnosis not present

## 2023-08-15 DIAGNOSIS — I081 Rheumatic disorders of both mitral and tricuspid valves: Secondary | ICD-10-CM | POA: Diagnosis not present

## 2023-08-15 DIAGNOSIS — Z9181 History of falling: Secondary | ICD-10-CM | POA: Diagnosis not present

## 2023-08-15 DIAGNOSIS — Z435 Encounter for attention to cystostomy: Secondary | ICD-10-CM | POA: Diagnosis not present

## 2023-08-15 DIAGNOSIS — H900 Conductive hearing loss, bilateral: Secondary | ICD-10-CM | POA: Diagnosis not present

## 2023-08-15 DIAGNOSIS — T83511A Infection and inflammatory reaction due to indwelling urethral catheter, initial encounter: Secondary | ICD-10-CM | POA: Diagnosis not present

## 2023-08-15 DIAGNOSIS — S31109D Unspecified open wound of abdominal wall, unspecified quadrant without penetration into peritoneal cavity, subsequent encounter: Secondary | ICD-10-CM | POA: Diagnosis not present

## 2023-08-15 DIAGNOSIS — D649 Anemia, unspecified: Secondary | ICD-10-CM | POA: Diagnosis not present

## 2023-08-15 DIAGNOSIS — K581 Irritable bowel syndrome with constipation: Secondary | ICD-10-CM | POA: Diagnosis not present

## 2023-08-15 DIAGNOSIS — Z9049 Acquired absence of other specified parts of digestive tract: Secondary | ICD-10-CM | POA: Diagnosis not present

## 2023-08-15 DIAGNOSIS — I890 Lymphedema, not elsewhere classified: Secondary | ICD-10-CM | POA: Diagnosis not present

## 2023-08-15 DIAGNOSIS — N390 Urinary tract infection, site not specified: Secondary | ICD-10-CM | POA: Diagnosis not present

## 2023-08-15 DIAGNOSIS — M4802 Spinal stenosis, cervical region: Secondary | ICD-10-CM | POA: Diagnosis not present

## 2023-08-15 DIAGNOSIS — I5032 Chronic diastolic (congestive) heart failure: Secondary | ICD-10-CM | POA: Diagnosis not present

## 2023-08-15 DIAGNOSIS — R1310 Dysphagia, unspecified: Secondary | ICD-10-CM | POA: Diagnosis not present

## 2023-08-15 DIAGNOSIS — G894 Chronic pain syndrome: Secondary | ICD-10-CM | POA: Diagnosis not present

## 2023-08-15 DIAGNOSIS — G35 Multiple sclerosis: Secondary | ICD-10-CM | POA: Diagnosis not present

## 2023-08-15 DIAGNOSIS — N319 Neuromuscular dysfunction of bladder, unspecified: Secondary | ICD-10-CM | POA: Diagnosis not present

## 2023-08-15 DIAGNOSIS — K8689 Other specified diseases of pancreas: Secondary | ICD-10-CM | POA: Diagnosis not present

## 2023-08-15 DIAGNOSIS — K219 Gastro-esophageal reflux disease without esophagitis: Secondary | ICD-10-CM | POA: Diagnosis not present

## 2023-08-15 DIAGNOSIS — Z853 Personal history of malignant neoplasm of breast: Secondary | ICD-10-CM | POA: Diagnosis not present

## 2023-08-17 ENCOUNTER — Inpatient Hospital Stay (HOSPITAL_BASED_OUTPATIENT_CLINIC_OR_DEPARTMENT_OTHER): Admitting: Oncology

## 2023-08-17 ENCOUNTER — Encounter: Payer: Self-pay | Admitting: Oncology

## 2023-08-17 ENCOUNTER — Other Ambulatory Visit: Payer: Self-pay | Admitting: Urology

## 2023-08-17 ENCOUNTER — Inpatient Hospital Stay: Attending: Oncology

## 2023-08-17 VITALS — BP 130/74 | HR 99 | Temp 96.9°F | Resp 17

## 2023-08-17 DIAGNOSIS — Z8041 Family history of malignant neoplasm of ovary: Secondary | ICD-10-CM | POA: Diagnosis not present

## 2023-08-17 DIAGNOSIS — Z803 Family history of malignant neoplasm of breast: Secondary | ICD-10-CM | POA: Insufficient documentation

## 2023-08-17 DIAGNOSIS — C50911 Malignant neoplasm of unspecified site of right female breast: Secondary | ICD-10-CM | POA: Diagnosis not present

## 2023-08-17 DIAGNOSIS — G629 Polyneuropathy, unspecified: Secondary | ICD-10-CM | POA: Diagnosis not present

## 2023-08-17 DIAGNOSIS — G35 Multiple sclerosis: Secondary | ICD-10-CM | POA: Insufficient documentation

## 2023-08-17 DIAGNOSIS — N3289 Other specified disorders of bladder: Secondary | ICD-10-CM

## 2023-08-17 DIAGNOSIS — I890 Lymphedema, not elsewhere classified: Secondary | ICD-10-CM | POA: Diagnosis not present

## 2023-08-17 DIAGNOSIS — Z452 Encounter for adjustment and management of vascular access device: Secondary | ICD-10-CM | POA: Diagnosis not present

## 2023-08-17 DIAGNOSIS — Z9011 Acquired absence of right breast and nipple: Secondary | ICD-10-CM | POA: Diagnosis not present

## 2023-08-17 DIAGNOSIS — Z95828 Presence of other vascular implants and grafts: Secondary | ICD-10-CM

## 2023-08-17 DIAGNOSIS — Z853 Personal history of malignant neoplasm of breast: Secondary | ICD-10-CM | POA: Insufficient documentation

## 2023-08-17 LAB — CBC WITH DIFFERENTIAL/PLATELET
Abs Immature Granulocytes: 0.01 K/uL (ref 0.00–0.07)
Basophils Absolute: 0 K/uL (ref 0.0–0.1)
Basophils Relative: 0 %
Eosinophils Absolute: 0.2 K/uL (ref 0.0–0.5)
Eosinophils Relative: 5 %
HCT: 35.2 % — ABNORMAL LOW (ref 36.0–46.0)
Hemoglobin: 11.5 g/dL — ABNORMAL LOW (ref 12.0–15.0)
Immature Granulocytes: 0 %
Lymphocytes Relative: 37 %
Lymphs Abs: 1.7 K/uL (ref 0.7–4.0)
MCH: 28.7 pg (ref 26.0–34.0)
MCHC: 32.7 g/dL (ref 30.0–36.0)
MCV: 87.8 fL (ref 80.0–100.0)
Monocytes Absolute: 0.4 K/uL (ref 0.1–1.0)
Monocytes Relative: 8 %
Neutro Abs: 2.3 K/uL (ref 1.7–7.7)
Neutrophils Relative %: 50 %
Platelets: 196 K/uL (ref 150–400)
RBC: 4.01 MIL/uL (ref 3.87–5.11)
RDW: 13.9 % (ref 11.5–15.5)
WBC: 4.7 K/uL (ref 4.0–10.5)
nRBC: 0 % (ref 0.0–0.2)

## 2023-08-17 MED ORDER — HEPARIN SOD (PORK) LOCK FLUSH 100 UNIT/ML IV SOLN
500.0000 [IU] | Freq: Once | INTRAVENOUS | Status: AC
  Administered 2023-08-17: 500 [IU] via INTRAVENOUS
  Filled 2023-08-17: qty 5

## 2023-08-17 MED ORDER — OXYBUTYNIN CHLORIDE ER 10 MG PO TB24: 10.0000 mg | ORAL_TABLET | Freq: Every day | ORAL | 0 refills | Status: DC

## 2023-08-17 MED ORDER — SODIUM CHLORIDE 0.9% FLUSH
10.0000 mL | Freq: Once | INTRAVENOUS | Status: AC
  Administered 2023-08-17: 10 mL via INTRAVENOUS
  Filled 2023-08-17: qty 10

## 2023-08-17 NOTE — Progress Notes (Signed)
 [facility]  [phone] [phone]) [phone]  ID: Rebecca [name] OB: 10-Apr-1960  MR#: [id number]  RDW#:[id number]  Patient Care Team: Leavy Mole, PA-C as PCP - General (Family Medicine) Richarda Shell, MD as Referring Physician Margaret Eduard SAUNDERS, MD as Consulting Physician (Neurology) Jacobo Evalene PARAS, MD as Consulting Physician (Oncology) Anner Alm ORN, MD as Consulting Physician (Cardiology) Perla Evalene PARAS, MD as Consulting Physician (Cardiology) Helon Clotilda DELENA DEVONNA as Physician Assistant (Urology) Valora Mliss BRAVO, [facility] (Inactive) (Pharmacist) Pa, [facility] (Optometry)  CHIEF COMPLAINT: 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 BRCA negative.  INTERVAL HISTORY: Patient returns to clinic today for routine yearly evaluation.  She now requires transport by EMS after recent fracture of her left leg.  Patient reports she can no longer use a wheelchair but is undergoing physical therapy once per week in the hopes of regaining that ability.  She otherwise has felt well.  She has no new neurologic complaints and her multiple sclerosis has been stable.  She does not complain of pain today. Her lower extremity edema is unchanged.  She denies any recent fevers or illnesses.  She denies any chest pain, shortness of breath, cough, or hemoptysis.  She denies any nausea, vomiting, constipation, or diarrhea.  She has no urinary complaints.  Patient offers no further specific complaints today.  REVIEW OF SYSTEMS:   Review of Systems  Constitutional:  Negative for chills, fever and malaise/fatigue.  Respiratory:  Negative for cough and shortness of breath.   Cardiovascular:  Positive for leg swelling. Negative for chest pain.  Gastrointestinal:  Negative for abdominal pain, constipation, diarrhea, nausea and vomiting.  Genitourinary: Negative.  Negative for dysuria, frequency and urgency.  Musculoskeletal: Negative.  Negative for  back pain.  Skin: Negative.  Negative for rash.  Neurological:  Positive for focal weakness. Negative for sensory change, weakness and headaches.  Psychiatric/Behavioral: Negative.  The patient is not nervous/anxious.     As per HPI. Otherwise, a complete review of systems is negative.  PAST MEDICAL HISTORY: Past Medical History:  Diagnosis Date   Allergy    Aortic valve disease    Mild AS / AI - most recent Echo demonstrated tricuspid aortic valve.   Bacterial endocarditis    History of .   Bilateral impacted cerumen 06/29/2021   Bilateral lower extremity edema    Noncardiac.  Chronic. LE Venous dopplers - negative for DVT.; Echocardiogram January 2016: Normal EF with normal wall motion and valve function. Only grade 1 diastolic dysfunction. EF 60-65%. Mild MR   Breast cancer (HCC) 12/31/2013   Right breast, 12:00, 1.5 cm, T1c,N0 invasive mammary carcinoma, triple negative. --> Rx with Chemo   Cervical stenosis of spine    GERD (gastroesophageal reflux disease) April or May 2022   Heart murmur 2013   Herpes zoster    IBS (irritable bowel syndrome)    Lymphedema    has legs wrapped at [facility]   Multiple sclerosis (HCC) 2001   Walks from room to room @ home; but Wheelchair when going out.   Neuromuscular disorder (HCC)    MS   PONV (postoperative nausea and vomiting)    Related to Fentanyl    Seizures (HCC)    Takes Keppra    SOB (shortness of breath) 03/01/2013   Syncope and collapse    Urinary tract infection associated with indwelling urethral catheter (HCC) 02/17/2020    PAST SURGICAL HISTORY: Past Surgical History:  Procedure Laterality Date   ANKLE SURGERY  Left   ANKLE SURGERY     ANTERIOR CERVICAL DECOMP/DISCECTOMY FUSION  11/17/2011   Procedure: ANTERIOR CERVICAL DECOMPRESSION/DISCECTOMY FUSION 2 LEVELS;  Surgeon: Fairy Levels, MD;  Location: [facility];  Service: Neurosurgery;  Laterality: N/A;  Cervical Five-Six Six-Seven Anterior cervical  decompression/diskectomy/fusion   BREAST BIOPSY Right 12/31/2013   invasive mammary   BREAST SURGERY Right 02/03/2014   Right simple mastectomy with sentinel node biopsy.   CHOLECYSTECTOMY     COLONOSCOPY  2014   ESOPHAGOGASTRODUODENOSCOPY (EGD) WITH PROPOFOL  N/A 07/16/2020   Procedure: ESOPHAGOGASTRODUODENOSCOPY (EGD) WITH PROPOFOL ;  Surgeon: Janalyn Keene NOVAK, MD;  Location: [facility] ENDOSCOPY;  Service: Endoscopy;  Laterality: N/A;  Patient has MS and will need assistance   HYSTEROSCOPY WITH D & C N/A 11/27/2018   Procedure: DILATATION AND CURETTAGE /HYSTEROSCOPY;  Surgeon: Arloa Lamar SQUIBB, MD;  Location: [facility] ORS;  Service: Gynecology;  Laterality: N/A;   IR CATHETER TUBE CHANGE  05/05/2023   IR CYSTOSTOMY TUBE CHANGE COMPLICATED  06/09/2023   IR CYSTOSTOMY TUBE PLACEMENT/BLADDER ASPIRATION  03/23/2023   Lower extremity venous Dopplers  02/27/2013   No LE DVT   MASTECTOMY Right 2015   ORIF TIBIA PLATEAU Left 12/09/2020   Procedure: LEFT OPEN REDUCTION INTERNAL FIXATION (ORIF) TIBIAL PLATEAU;  Surgeon: Kendal Franky SQUIBB, MD;  Location: [facility] OR;  Service: Orthopedics;  Laterality: Left;   Port a cath insertion Right 01/19/2010   PORT-A-CATH REMOVAL     right   PORT-A-CATH REMOVAL Right 09/03/2013   Procedure: REMOVAL PORT-A-CATH;  Surgeon: Redell LITTIE Door, MD;  [facility];  Service: Vascular;  Laterality: Right;   SPINE SURGERY  Cervical steinois Dr. Levels 2014 I think?   TONSILLECTOMY     TRANSTHORACIC ECHOCARDIOGRAM  03/2013; 02/2014   a) Normal LV size and function with EF 60-65%.; Cannot exclude bicuspid aortic valve with mild AS and mild AI.; b) Normal EF with normal wall motion and valve function x Mild MR. G2 DD. EF 60-65%. Tricuspid AoV   UPPER GI ENDOSCOPY  2014    FAMILY HISTORY Family History  Problem Relation Age of Onset   Cancer Father        skin   Heart disease Father    Heart attack Father        heart attack in his 58's   Thyroid  disease Sister    Ovarian cancer  Cousin    Breast cancer Maternal Aunt 9   Breast cancer Maternal Grandmother 63   Bladder Cancer Neg Hx    Kidney cancer Neg Hx        ADVANCED DIRECTIVES:    HEALTH MAINTENANCE: Social History   Tobacco Use   Smoking status: Never   Smokeless tobacco: Never   Tobacco comments:    Never smoked. My mom smoked. Passed 04/18/20. Dad smoked, quit 1973. Sister smokes.  Vaping Use   Vaping status: Never Used  Substance Use Topics   Alcohol use: No   Drug use: No     Colonoscopy:  PAP:  Bone density:  Lipid panel:  Allergies  Allergen Reactions   Fentanyl  Nausea And Vomiting and Nausea Only    vomiting Was given in PACU x3 each time patient got sick    Sulfa Antibiotics Hives and Other (See Comments)    Light headed, over heated    Current Outpatient Medications  Medication Sig Dispense Refill   amantadine  (SYMMETREL ) 100 MG capsule TAKE 1 CAPSULE BY MOUTH THREE TIMES A DAY 270 capsule 1   baclofen  (  LIORESAL ) 10 MG tablet Take 1-2 tablets (10-20 mg total) by mouth 3 (three) times daily. 120 tablet 12   gluconic acid-citric acid (RENACIDIN ) irrigation Insert 30 mL into suprapubic tube and clamp tube for 10 minutes and then drain twice daily 500 mL 1   Glucosamine-Chondroitin (COSAMIN DS PO) Take 1 tablet by mouth 3 (three) times daily.     levETIRAcetam  (KEPPRA ) 500 MG tablet Take 1 tablet (500 mg total) by mouth 2 (two) times daily. 180 tablet 4   lipase/protease/amylase (CREON ) 36000 UNITS CPEP capsule Take 2 capsules (72,000 Units total) by mouth 3 (three) times daily with meals AND 1 capsule (36,000 Units total) with snacks. (Patient taking differently: Take 1-2 capsules (36,000- 72,000 Units total) by mouth with (three) times daily with meals AND 1 capsule (36,000 Units total) with snacks. (1 capsule by mouth with breakfast, 2 capsules by mouth with lunch and 1 capsule by mouth with dinner)  ) 270 capsule 11   loratadine  (CLARITIN ) 10 MG tablet Take 10 mg by mouth daily.      Misc Natural Products (LEG VEIN & CIRCULATION) TABS Take 1 tablet by mouth 2 (two) times daily.      Multiple Vitamins-Minerals (MULTIVITAMIN PO) Take 1 tablet by mouth daily.      nystatin  (MYCOSTATIN /NYSTOP ) powder Apply 1 application topically 3 (three) times daily as needed. For rash/raw skin as needed 15 g 2   omeprazole  (PRILOSEC) 40 MG capsule TAKE 1 CAPSULE (40 MG TOTAL) BY MOUTH DAILY. 90 capsule 1   REBIF  REBIDOSE 44 MCG/0.5ML SOAJ Inject 0.5 mLs into the skin 3 (three) times a week. 18 mL 0   tiZANidine  (ZANAFLEX ) 4 MG tablet Take 1 tablet (4 mg total) by mouth at bedtime. 90 tablet 4   Turmeric 500 MG CAPS Take 500 mg by mouth daily.     Vibegron  (GEMTESA ) 75 MG TABS Take 1 tablet (75 mg total) by mouth daily. 42 tablet 0   No current facility-administered medications for this visit.    OBJECTIVE: Vitals:   08/17/23 0932  BP: 130/74  Pulse: 99  Resp: 17  Temp: (!) 96.9 F (36.1 C)  SpO2: 97%      There is no height or weight on file to calculate BMI.    ECOG FS:4 - Bedbound  General: Well-developed, well-nourished, no acute distress.  In EMS stretcher. Eyes: Pink conjunctiva, anicteric sclera. HEENT: Normocephalic, moist mucous membranes. Lungs: No audible wheezing or coughing. Heart: Regular rate and rhythm. Abdomen: Soft, nontender, no obvious distention. Musculoskeletal: No edema, cyanosis, or clubbing. Neuro: Alert, answering all questions appropriately. Cranial nerves grossly intact. Skin: No rashes or petechiae noted. Psych: Normal affect.    LAB RESULTS:  Lab Results  Component Value Date   NA 130 (L) 07/13/2023   K 3.2 (L) 07/13/2023   CL 100 07/13/2023   CO2 21 (L) 07/13/2023   GLUCOSE 150 (H) 07/13/2023   BUN 11 07/13/2023   CREATININE 0.53 07/13/2023   CALCIUM  8.5 (L) 07/13/2023   PROT 7.6 07/13/2023   ALBUMIN 3.4 (L) 07/13/2023   AST 23 07/13/2023   ALT 19 07/13/2023   ALKPHOS 65 07/13/2023   BILITOT 0.9 07/13/2023   GFRNONAA >60  07/13/2023   GFRAA 117 01/18/2019    Lab Results  Component Value Date   WBC 4.7 08/17/2023   NEUTROABS 2.3 08/17/2023   HGB 11.5 (L) 08/17/2023   HCT 35.2 (L) 08/17/2023   MCV 87.8 08/17/2023   PLT 196 08/17/2023  STUDIES: No results found.   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 BRCA negative.  PLAN:    Triple negative right breast cancer: No evidence of disease. Given her difficulties with Taxol  and multiple sclerosis, treatment was discontinued after a total of 6 of 12 cycles of Taxol . Her last chemotherapy was July 17, 2014.  Because patient had a full mastectomy, she did not require adjuvant XRT. Given the fact that she is a triple negative cancer, she did not require an aromatase inhibitor.  Patient's most recent mammogram on November 08, 2021 was reported as BI-RADS 1.  Now that she requires transferred via EMS, she has not been able to get her yearly mammogram.  CA 27-29 continues to be within normal limits, today's result is pending.  Patient is now nearly 10 years removed from completing her treatments and can be discharged from clinic.  Recommend primary care reinitiate mammograms when patient has the ability to transport in a wheelchair.  No further follow-up has been scheduled.   Multiple sclerosis: Continue evaluation and current treatment per neurology. Her primary neurologist is Dr. Eduard Hanlon.  Lymphedema: Chronic and unchanged.   Peripheral neuropathy: Patient does not complain of this today. Broken left leg: Continue rehab as needed.    Patient expressed understanding and was in agreement with this plan. She also understands that She can call clinic at any time with any questions, concerns, or complaints.   Breast cancer   Staging form: Breast, AJCC 7th Edition     Pathologic stage from 05/24/2014: Stage IA (T1c, N0, cM0) - Signed by Evalene JINNY Reusing, MD on 05/24/2014   Evalene JINNY Reusing, MD   08/17/2023 12:25 PM

## 2023-08-18 DIAGNOSIS — M4802 Spinal stenosis, cervical region: Secondary | ICD-10-CM | POA: Diagnosis not present

## 2023-08-18 DIAGNOSIS — Z9049 Acquired absence of other specified parts of digestive tract: Secondary | ICD-10-CM | POA: Diagnosis not present

## 2023-08-18 DIAGNOSIS — H900 Conductive hearing loss, bilateral: Secondary | ICD-10-CM | POA: Diagnosis not present

## 2023-08-18 DIAGNOSIS — Z853 Personal history of malignant neoplasm of breast: Secondary | ICD-10-CM | POA: Diagnosis not present

## 2023-08-18 DIAGNOSIS — G894 Chronic pain syndrome: Secondary | ICD-10-CM | POA: Diagnosis not present

## 2023-08-18 DIAGNOSIS — K219 Gastro-esophageal reflux disease without esophagitis: Secondary | ICD-10-CM | POA: Diagnosis not present

## 2023-08-18 DIAGNOSIS — G35 Multiple sclerosis: Secondary | ICD-10-CM | POA: Diagnosis not present

## 2023-08-18 DIAGNOSIS — S31109D Unspecified open wound of abdominal wall, unspecified quadrant without penetration into peritoneal cavity, subsequent encounter: Secondary | ICD-10-CM | POA: Diagnosis not present

## 2023-08-18 DIAGNOSIS — Z9181 History of falling: Secondary | ICD-10-CM | POA: Diagnosis not present

## 2023-08-18 DIAGNOSIS — I890 Lymphedema, not elsewhere classified: Secondary | ICD-10-CM | POA: Diagnosis not present

## 2023-08-18 DIAGNOSIS — Z435 Encounter for attention to cystostomy: Secondary | ICD-10-CM | POA: Diagnosis not present

## 2023-08-18 DIAGNOSIS — D649 Anemia, unspecified: Secondary | ICD-10-CM | POA: Diagnosis not present

## 2023-08-18 DIAGNOSIS — N319 Neuromuscular dysfunction of bladder, unspecified: Secondary | ICD-10-CM | POA: Diagnosis not present

## 2023-08-18 DIAGNOSIS — K8689 Other specified diseases of pancreas: Secondary | ICD-10-CM | POA: Diagnosis not present

## 2023-08-18 DIAGNOSIS — I081 Rheumatic disorders of both mitral and tricuspid valves: Secondary | ICD-10-CM | POA: Diagnosis not present

## 2023-08-18 DIAGNOSIS — I5032 Chronic diastolic (congestive) heart failure: Secondary | ICD-10-CM | POA: Diagnosis not present

## 2023-08-18 DIAGNOSIS — R1310 Dysphagia, unspecified: Secondary | ICD-10-CM | POA: Diagnosis not present

## 2023-08-18 DIAGNOSIS — G8220 Paraplegia, unspecified: Secondary | ICD-10-CM | POA: Diagnosis not present

## 2023-08-18 DIAGNOSIS — K581 Irritable bowel syndrome with constipation: Secondary | ICD-10-CM | POA: Diagnosis not present

## 2023-08-18 DIAGNOSIS — N390 Urinary tract infection, site not specified: Secondary | ICD-10-CM | POA: Diagnosis not present

## 2023-08-18 DIAGNOSIS — T83511A Infection and inflammatory reaction due to indwelling urethral catheter, initial encounter: Secondary | ICD-10-CM | POA: Diagnosis not present

## 2023-08-18 LAB — CANCER ANTIGEN 27.29: CA 27.29: 16.9 U/mL (ref 0.0–38.6)

## 2023-08-21 DIAGNOSIS — N390 Urinary tract infection, site not specified: Secondary | ICD-10-CM | POA: Diagnosis not present

## 2023-08-21 DIAGNOSIS — T83511A Infection and inflammatory reaction due to indwelling urethral catheter, initial encounter: Secondary | ICD-10-CM | POA: Diagnosis not present

## 2023-08-21 DIAGNOSIS — Z9049 Acquired absence of other specified parts of digestive tract: Secondary | ICD-10-CM | POA: Diagnosis not present

## 2023-08-21 DIAGNOSIS — K8689 Other specified diseases of pancreas: Secondary | ICD-10-CM | POA: Diagnosis not present

## 2023-08-21 DIAGNOSIS — S31109D Unspecified open wound of abdominal wall, unspecified quadrant without penetration into peritoneal cavity, subsequent encounter: Secondary | ICD-10-CM | POA: Diagnosis not present

## 2023-08-21 DIAGNOSIS — D649 Anemia, unspecified: Secondary | ICD-10-CM | POA: Diagnosis not present

## 2023-08-21 DIAGNOSIS — G8220 Paraplegia, unspecified: Secondary | ICD-10-CM | POA: Diagnosis not present

## 2023-08-21 DIAGNOSIS — M4802 Spinal stenosis, cervical region: Secondary | ICD-10-CM | POA: Diagnosis not present

## 2023-08-21 DIAGNOSIS — Z9181 History of falling: Secondary | ICD-10-CM | POA: Diagnosis not present

## 2023-08-21 DIAGNOSIS — I081 Rheumatic disorders of both mitral and tricuspid valves: Secondary | ICD-10-CM | POA: Diagnosis not present

## 2023-08-21 DIAGNOSIS — Z853 Personal history of malignant neoplasm of breast: Secondary | ICD-10-CM | POA: Diagnosis not present

## 2023-08-21 DIAGNOSIS — R1310 Dysphagia, unspecified: Secondary | ICD-10-CM | POA: Diagnosis not present

## 2023-08-21 DIAGNOSIS — Z435 Encounter for attention to cystostomy: Secondary | ICD-10-CM | POA: Diagnosis not present

## 2023-08-21 DIAGNOSIS — I5032 Chronic diastolic (congestive) heart failure: Secondary | ICD-10-CM | POA: Diagnosis not present

## 2023-08-21 DIAGNOSIS — K581 Irritable bowel syndrome with constipation: Secondary | ICD-10-CM | POA: Diagnosis not present

## 2023-08-21 DIAGNOSIS — K219 Gastro-esophageal reflux disease without esophagitis: Secondary | ICD-10-CM | POA: Diagnosis not present

## 2023-08-21 DIAGNOSIS — H900 Conductive hearing loss, bilateral: Secondary | ICD-10-CM | POA: Diagnosis not present

## 2023-08-21 DIAGNOSIS — I890 Lymphedema, not elsewhere classified: Secondary | ICD-10-CM | POA: Diagnosis not present

## 2023-08-21 DIAGNOSIS — G894 Chronic pain syndrome: Secondary | ICD-10-CM | POA: Diagnosis not present

## 2023-08-21 DIAGNOSIS — N319 Neuromuscular dysfunction of bladder, unspecified: Secondary | ICD-10-CM | POA: Diagnosis not present

## 2023-08-21 DIAGNOSIS — G35 Multiple sclerosis: Secondary | ICD-10-CM | POA: Diagnosis not present

## 2023-08-23 ENCOUNTER — Encounter: Attending: Physician Assistant | Admitting: Physician Assistant

## 2023-08-23 ENCOUNTER — Ambulatory Visit: Admitting: Urology

## 2023-08-23 DIAGNOSIS — L24A0 Irritant contact dermatitis due to friction or contact with body fluids, unspecified: Secondary | ICD-10-CM | POA: Diagnosis not present

## 2023-08-23 DIAGNOSIS — G4089 Other seizures: Secondary | ICD-10-CM | POA: Insufficient documentation

## 2023-08-23 DIAGNOSIS — G35 Multiple sclerosis: Secondary | ICD-10-CM | POA: Insufficient documentation

## 2023-08-23 DIAGNOSIS — L98492 Non-pressure chronic ulcer of skin of other sites with fat layer exposed: Secondary | ICD-10-CM | POA: Diagnosis not present

## 2023-08-23 NOTE — Progress Notes (Unsigned)
 Suprapubic Cath Change  Patient is present today for a suprapubic catheter change due to urinary retention.  9 ml of water was drained from the balloon, a 18 FR Silastic foley cath was removed from the tract with out difficulty.  Site was cleaned and prepped in a sterile fashion with betadine.  A 18 FR Silastic foley cath was replaced into the tract no complications were noted. Urine return was noted, 10 ml of sterile water was inflated into the balloon and a night bag was attached for drainage.  Patient tolerated well.   Performed by: CLOTILDA CORNWALL, PA-C   Follow up: Return in about 1 month (around 09/24/2023) for SPT exchange .

## 2023-08-24 ENCOUNTER — Ambulatory Visit (INDEPENDENT_AMBULATORY_CARE_PROVIDER_SITE_OTHER): Admitting: Urology

## 2023-08-24 DIAGNOSIS — Z9049 Acquired absence of other specified parts of digestive tract: Secondary | ICD-10-CM | POA: Diagnosis not present

## 2023-08-24 DIAGNOSIS — G35 Multiple sclerosis: Secondary | ICD-10-CM | POA: Diagnosis not present

## 2023-08-24 DIAGNOSIS — Z853 Personal history of malignant neoplasm of breast: Secondary | ICD-10-CM | POA: Diagnosis not present

## 2023-08-24 DIAGNOSIS — D649 Anemia, unspecified: Secondary | ICD-10-CM | POA: Diagnosis not present

## 2023-08-24 DIAGNOSIS — Z9181 History of falling: Secondary | ICD-10-CM | POA: Diagnosis not present

## 2023-08-24 DIAGNOSIS — K8689 Other specified diseases of pancreas: Secondary | ICD-10-CM | POA: Diagnosis not present

## 2023-08-24 DIAGNOSIS — G8220 Paraplegia, unspecified: Secondary | ICD-10-CM | POA: Diagnosis not present

## 2023-08-24 DIAGNOSIS — G894 Chronic pain syndrome: Secondary | ICD-10-CM | POA: Diagnosis not present

## 2023-08-24 DIAGNOSIS — N390 Urinary tract infection, site not specified: Secondary | ICD-10-CM | POA: Diagnosis not present

## 2023-08-24 DIAGNOSIS — Z435 Encounter for attention to cystostomy: Secondary | ICD-10-CM | POA: Diagnosis not present

## 2023-08-24 DIAGNOSIS — I890 Lymphedema, not elsewhere classified: Secondary | ICD-10-CM | POA: Diagnosis not present

## 2023-08-24 DIAGNOSIS — I5032 Chronic diastolic (congestive) heart failure: Secondary | ICD-10-CM | POA: Diagnosis not present

## 2023-08-24 DIAGNOSIS — K581 Irritable bowel syndrome with constipation: Secondary | ICD-10-CM | POA: Diagnosis not present

## 2023-08-24 DIAGNOSIS — T83511A Infection and inflammatory reaction due to indwelling urethral catheter, initial encounter: Secondary | ICD-10-CM | POA: Diagnosis not present

## 2023-08-24 DIAGNOSIS — H900 Conductive hearing loss, bilateral: Secondary | ICD-10-CM | POA: Diagnosis not present

## 2023-08-24 DIAGNOSIS — K219 Gastro-esophageal reflux disease without esophagitis: Secondary | ICD-10-CM | POA: Diagnosis not present

## 2023-08-24 DIAGNOSIS — S31109D Unspecified open wound of abdominal wall, unspecified quadrant without penetration into peritoneal cavity, subsequent encounter: Secondary | ICD-10-CM | POA: Diagnosis not present

## 2023-08-24 DIAGNOSIS — I081 Rheumatic disorders of both mitral and tricuspid valves: Secondary | ICD-10-CM | POA: Diagnosis not present

## 2023-08-24 DIAGNOSIS — N319 Neuromuscular dysfunction of bladder, unspecified: Secondary | ICD-10-CM | POA: Diagnosis not present

## 2023-08-24 DIAGNOSIS — M4802 Spinal stenosis, cervical region: Secondary | ICD-10-CM | POA: Diagnosis not present

## 2023-08-24 DIAGNOSIS — R1310 Dysphagia, unspecified: Secondary | ICD-10-CM | POA: Diagnosis not present

## 2023-08-25 DIAGNOSIS — Z9181 History of falling: Secondary | ICD-10-CM | POA: Diagnosis not present

## 2023-08-25 DIAGNOSIS — N319 Neuromuscular dysfunction of bladder, unspecified: Secondary | ICD-10-CM | POA: Diagnosis not present

## 2023-08-25 DIAGNOSIS — Z9049 Acquired absence of other specified parts of digestive tract: Secondary | ICD-10-CM | POA: Diagnosis not present

## 2023-08-25 DIAGNOSIS — R1310 Dysphagia, unspecified: Secondary | ICD-10-CM | POA: Diagnosis not present

## 2023-08-25 DIAGNOSIS — K219 Gastro-esophageal reflux disease without esophagitis: Secondary | ICD-10-CM | POA: Diagnosis not present

## 2023-08-25 DIAGNOSIS — H900 Conductive hearing loss, bilateral: Secondary | ICD-10-CM | POA: Diagnosis not present

## 2023-08-25 DIAGNOSIS — Z435 Encounter for attention to cystostomy: Secondary | ICD-10-CM | POA: Diagnosis not present

## 2023-08-25 DIAGNOSIS — K581 Irritable bowel syndrome with constipation: Secondary | ICD-10-CM | POA: Diagnosis not present

## 2023-08-25 DIAGNOSIS — T83511A Infection and inflammatory reaction due to indwelling urethral catheter, initial encounter: Secondary | ICD-10-CM | POA: Diagnosis not present

## 2023-08-25 DIAGNOSIS — G8220 Paraplegia, unspecified: Secondary | ICD-10-CM | POA: Diagnosis not present

## 2023-08-25 DIAGNOSIS — I890 Lymphedema, not elsewhere classified: Secondary | ICD-10-CM | POA: Diagnosis not present

## 2023-08-25 DIAGNOSIS — Z853 Personal history of malignant neoplasm of breast: Secondary | ICD-10-CM | POA: Diagnosis not present

## 2023-08-25 DIAGNOSIS — G894 Chronic pain syndrome: Secondary | ICD-10-CM | POA: Diagnosis not present

## 2023-08-25 DIAGNOSIS — D649 Anemia, unspecified: Secondary | ICD-10-CM | POA: Diagnosis not present

## 2023-08-25 DIAGNOSIS — I5032 Chronic diastolic (congestive) heart failure: Secondary | ICD-10-CM | POA: Diagnosis not present

## 2023-08-25 DIAGNOSIS — I081 Rheumatic disorders of both mitral and tricuspid valves: Secondary | ICD-10-CM | POA: Diagnosis not present

## 2023-08-25 DIAGNOSIS — N390 Urinary tract infection, site not specified: Secondary | ICD-10-CM | POA: Diagnosis not present

## 2023-08-25 DIAGNOSIS — K8689 Other specified diseases of pancreas: Secondary | ICD-10-CM | POA: Diagnosis not present

## 2023-08-25 DIAGNOSIS — G35 Multiple sclerosis: Secondary | ICD-10-CM | POA: Diagnosis not present

## 2023-08-25 DIAGNOSIS — M4802 Spinal stenosis, cervical region: Secondary | ICD-10-CM | POA: Diagnosis not present

## 2023-08-25 DIAGNOSIS — S31109D Unspecified open wound of abdominal wall, unspecified quadrant without penetration into peritoneal cavity, subsequent encounter: Secondary | ICD-10-CM | POA: Diagnosis not present

## 2023-08-28 DIAGNOSIS — S31109D Unspecified open wound of abdominal wall, unspecified quadrant without penetration into peritoneal cavity, subsequent encounter: Secondary | ICD-10-CM | POA: Diagnosis not present

## 2023-08-28 DIAGNOSIS — K581 Irritable bowel syndrome with constipation: Secondary | ICD-10-CM | POA: Diagnosis not present

## 2023-08-28 DIAGNOSIS — I890 Lymphedema, not elsewhere classified: Secondary | ICD-10-CM | POA: Diagnosis not present

## 2023-08-28 DIAGNOSIS — T83511A Infection and inflammatory reaction due to indwelling urethral catheter, initial encounter: Secondary | ICD-10-CM | POA: Diagnosis not present

## 2023-08-28 DIAGNOSIS — K8689 Other specified diseases of pancreas: Secondary | ICD-10-CM | POA: Diagnosis not present

## 2023-08-28 DIAGNOSIS — K219 Gastro-esophageal reflux disease without esophagitis: Secondary | ICD-10-CM | POA: Diagnosis not present

## 2023-08-28 DIAGNOSIS — Z9181 History of falling: Secondary | ICD-10-CM | POA: Diagnosis not present

## 2023-08-28 DIAGNOSIS — R1310 Dysphagia, unspecified: Secondary | ICD-10-CM | POA: Diagnosis not present

## 2023-08-28 DIAGNOSIS — G894 Chronic pain syndrome: Secondary | ICD-10-CM | POA: Diagnosis not present

## 2023-08-28 DIAGNOSIS — H900 Conductive hearing loss, bilateral: Secondary | ICD-10-CM | POA: Diagnosis not present

## 2023-08-28 DIAGNOSIS — Z435 Encounter for attention to cystostomy: Secondary | ICD-10-CM | POA: Diagnosis not present

## 2023-08-28 DIAGNOSIS — M4802 Spinal stenosis, cervical region: Secondary | ICD-10-CM | POA: Diagnosis not present

## 2023-08-28 DIAGNOSIS — N390 Urinary tract infection, site not specified: Secondary | ICD-10-CM | POA: Diagnosis not present

## 2023-08-28 DIAGNOSIS — I081 Rheumatic disorders of both mitral and tricuspid valves: Secondary | ICD-10-CM | POA: Diagnosis not present

## 2023-08-28 DIAGNOSIS — Z853 Personal history of malignant neoplasm of breast: Secondary | ICD-10-CM | POA: Diagnosis not present

## 2023-08-28 DIAGNOSIS — G8220 Paraplegia, unspecified: Secondary | ICD-10-CM | POA: Diagnosis not present

## 2023-08-28 DIAGNOSIS — I5032 Chronic diastolic (congestive) heart failure: Secondary | ICD-10-CM | POA: Diagnosis not present

## 2023-08-28 DIAGNOSIS — D649 Anemia, unspecified: Secondary | ICD-10-CM | POA: Diagnosis not present

## 2023-08-28 DIAGNOSIS — G35 Multiple sclerosis: Secondary | ICD-10-CM | POA: Diagnosis not present

## 2023-08-28 DIAGNOSIS — N319 Neuromuscular dysfunction of bladder, unspecified: Secondary | ICD-10-CM | POA: Diagnosis not present

## 2023-08-28 DIAGNOSIS — Z9049 Acquired absence of other specified parts of digestive tract: Secondary | ICD-10-CM | POA: Diagnosis not present

## 2023-08-29 ENCOUNTER — Ambulatory Visit: Admitting: Internal Medicine

## 2023-08-30 ENCOUNTER — Other Ambulatory Visit: Payer: Self-pay | Admitting: Diagnostic Neuroimaging

## 2023-08-30 ENCOUNTER — Other Ambulatory Visit: Payer: Self-pay | Admitting: Urology

## 2023-08-30 DIAGNOSIS — N3941 Urge incontinence: Secondary | ICD-10-CM

## 2023-08-30 DIAGNOSIS — Z760 Encounter for issue of repeat prescription: Secondary | ICD-10-CM

## 2023-08-30 DIAGNOSIS — G40909 Epilepsy, unspecified, not intractable, without status epilepticus: Secondary | ICD-10-CM

## 2023-08-30 MED ORDER — GEMTESA 75 MG PO TABS: 75.0000 mg | ORAL_TABLET | Freq: Every day | ORAL | 11 refills | Status: DC

## 2023-08-31 ENCOUNTER — Other Ambulatory Visit: Payer: Self-pay | Admitting: Diagnostic Neuroimaging

## 2023-08-31 DIAGNOSIS — G35 Multiple sclerosis: Secondary | ICD-10-CM

## 2023-08-31 DIAGNOSIS — Z760 Encounter for issue of repeat prescription: Secondary | ICD-10-CM

## 2023-08-31 DIAGNOSIS — G8220 Paraplegia, unspecified: Secondary | ICD-10-CM

## 2023-09-01 DIAGNOSIS — Z435 Encounter for attention to cystostomy: Secondary | ICD-10-CM | POA: Diagnosis not present

## 2023-09-01 DIAGNOSIS — D649 Anemia, unspecified: Secondary | ICD-10-CM | POA: Diagnosis not present

## 2023-09-01 DIAGNOSIS — M4802 Spinal stenosis, cervical region: Secondary | ICD-10-CM | POA: Diagnosis not present

## 2023-09-01 DIAGNOSIS — Z853 Personal history of malignant neoplasm of breast: Secondary | ICD-10-CM | POA: Diagnosis not present

## 2023-09-01 DIAGNOSIS — R1310 Dysphagia, unspecified: Secondary | ICD-10-CM | POA: Diagnosis not present

## 2023-09-01 DIAGNOSIS — G35 Multiple sclerosis: Secondary | ICD-10-CM | POA: Diagnosis not present

## 2023-09-01 DIAGNOSIS — H900 Conductive hearing loss, bilateral: Secondary | ICD-10-CM | POA: Diagnosis not present

## 2023-09-01 DIAGNOSIS — T83511A Infection and inflammatory reaction due to indwelling urethral catheter, initial encounter: Secondary | ICD-10-CM | POA: Diagnosis not present

## 2023-09-01 DIAGNOSIS — Z9049 Acquired absence of other specified parts of digestive tract: Secondary | ICD-10-CM | POA: Diagnosis not present

## 2023-09-01 DIAGNOSIS — K8689 Other specified diseases of pancreas: Secondary | ICD-10-CM | POA: Diagnosis not present

## 2023-09-01 DIAGNOSIS — N390 Urinary tract infection, site not specified: Secondary | ICD-10-CM | POA: Diagnosis not present

## 2023-09-01 DIAGNOSIS — I5032 Chronic diastolic (congestive) heart failure: Secondary | ICD-10-CM | POA: Diagnosis not present

## 2023-09-01 DIAGNOSIS — Z9181 History of falling: Secondary | ICD-10-CM | POA: Diagnosis not present

## 2023-09-01 DIAGNOSIS — G8220 Paraplegia, unspecified: Secondary | ICD-10-CM | POA: Diagnosis not present

## 2023-09-01 DIAGNOSIS — N319 Neuromuscular dysfunction of bladder, unspecified: Secondary | ICD-10-CM | POA: Diagnosis not present

## 2023-09-01 DIAGNOSIS — S31109D Unspecified open wound of abdominal wall, unspecified quadrant without penetration into peritoneal cavity, subsequent encounter: Secondary | ICD-10-CM | POA: Diagnosis not present

## 2023-09-01 DIAGNOSIS — G894 Chronic pain syndrome: Secondary | ICD-10-CM | POA: Diagnosis not present

## 2023-09-01 DIAGNOSIS — K219 Gastro-esophageal reflux disease without esophagitis: Secondary | ICD-10-CM | POA: Diagnosis not present

## 2023-09-01 DIAGNOSIS — K581 Irritable bowel syndrome with constipation: Secondary | ICD-10-CM | POA: Diagnosis not present

## 2023-09-01 DIAGNOSIS — I081 Rheumatic disorders of both mitral and tricuspid valves: Secondary | ICD-10-CM | POA: Diagnosis not present

## 2023-09-01 DIAGNOSIS — I890 Lymphedema, not elsewhere classified: Secondary | ICD-10-CM | POA: Diagnosis not present

## 2023-09-04 DIAGNOSIS — Z435 Encounter for attention to cystostomy: Secondary | ICD-10-CM | POA: Diagnosis not present

## 2023-09-04 DIAGNOSIS — K8689 Other specified diseases of pancreas: Secondary | ICD-10-CM | POA: Diagnosis not present

## 2023-09-04 DIAGNOSIS — T83511A Infection and inflammatory reaction due to indwelling urethral catheter, initial encounter: Secondary | ICD-10-CM | POA: Diagnosis not present

## 2023-09-04 DIAGNOSIS — H900 Conductive hearing loss, bilateral: Secondary | ICD-10-CM | POA: Diagnosis not present

## 2023-09-04 DIAGNOSIS — I5032 Chronic diastolic (congestive) heart failure: Secondary | ICD-10-CM | POA: Diagnosis not present

## 2023-09-04 DIAGNOSIS — I890 Lymphedema, not elsewhere classified: Secondary | ICD-10-CM | POA: Diagnosis not present

## 2023-09-04 DIAGNOSIS — K219 Gastro-esophageal reflux disease without esophagitis: Secondary | ICD-10-CM | POA: Diagnosis not present

## 2023-09-04 DIAGNOSIS — Z853 Personal history of malignant neoplasm of breast: Secondary | ICD-10-CM | POA: Diagnosis not present

## 2023-09-04 DIAGNOSIS — K581 Irritable bowel syndrome with constipation: Secondary | ICD-10-CM | POA: Diagnosis not present

## 2023-09-04 DIAGNOSIS — N390 Urinary tract infection, site not specified: Secondary | ICD-10-CM | POA: Diagnosis not present

## 2023-09-04 DIAGNOSIS — I081 Rheumatic disorders of both mitral and tricuspid valves: Secondary | ICD-10-CM | POA: Diagnosis not present

## 2023-09-04 DIAGNOSIS — M4802 Spinal stenosis, cervical region: Secondary | ICD-10-CM | POA: Diagnosis not present

## 2023-09-04 DIAGNOSIS — G35 Multiple sclerosis: Secondary | ICD-10-CM | POA: Diagnosis not present

## 2023-09-04 DIAGNOSIS — S31109D Unspecified open wound of abdominal wall, unspecified quadrant without penetration into peritoneal cavity, subsequent encounter: Secondary | ICD-10-CM | POA: Diagnosis not present

## 2023-09-04 DIAGNOSIS — D649 Anemia, unspecified: Secondary | ICD-10-CM | POA: Diagnosis not present

## 2023-09-04 DIAGNOSIS — Z9181 History of falling: Secondary | ICD-10-CM | POA: Diagnosis not present

## 2023-09-04 DIAGNOSIS — Z9049 Acquired absence of other specified parts of digestive tract: Secondary | ICD-10-CM | POA: Diagnosis not present

## 2023-09-04 DIAGNOSIS — R1310 Dysphagia, unspecified: Secondary | ICD-10-CM | POA: Diagnosis not present

## 2023-09-04 DIAGNOSIS — G894 Chronic pain syndrome: Secondary | ICD-10-CM | POA: Diagnosis not present

## 2023-09-04 DIAGNOSIS — N319 Neuromuscular dysfunction of bladder, unspecified: Secondary | ICD-10-CM | POA: Diagnosis not present

## 2023-09-04 DIAGNOSIS — G8220 Paraplegia, unspecified: Secondary | ICD-10-CM | POA: Diagnosis not present

## 2023-09-06 ENCOUNTER — Encounter: Admitting: Physician Assistant

## 2023-09-06 DIAGNOSIS — G35 Multiple sclerosis: Secondary | ICD-10-CM | POA: Diagnosis not present

## 2023-09-06 DIAGNOSIS — L24A0 Irritant contact dermatitis due to friction or contact with body fluids, unspecified: Secondary | ICD-10-CM | POA: Diagnosis not present

## 2023-09-06 DIAGNOSIS — L98492 Non-pressure chronic ulcer of skin of other sites with fat layer exposed: Secondary | ICD-10-CM | POA: Diagnosis not present

## 2023-09-07 DIAGNOSIS — K581 Irritable bowel syndrome with constipation: Secondary | ICD-10-CM | POA: Diagnosis not present

## 2023-09-07 DIAGNOSIS — I5032 Chronic diastolic (congestive) heart failure: Secondary | ICD-10-CM | POA: Diagnosis not present

## 2023-09-07 DIAGNOSIS — Z9181 History of falling: Secondary | ICD-10-CM | POA: Diagnosis not present

## 2023-09-07 DIAGNOSIS — N319 Neuromuscular dysfunction of bladder, unspecified: Secondary | ICD-10-CM | POA: Diagnosis not present

## 2023-09-07 DIAGNOSIS — N390 Urinary tract infection, site not specified: Secondary | ICD-10-CM | POA: Diagnosis not present

## 2023-09-07 DIAGNOSIS — D649 Anemia, unspecified: Secondary | ICD-10-CM | POA: Diagnosis not present

## 2023-09-07 DIAGNOSIS — H900 Conductive hearing loss, bilateral: Secondary | ICD-10-CM | POA: Diagnosis not present

## 2023-09-07 DIAGNOSIS — Z853 Personal history of malignant neoplasm of breast: Secondary | ICD-10-CM | POA: Diagnosis not present

## 2023-09-07 DIAGNOSIS — I081 Rheumatic disorders of both mitral and tricuspid valves: Secondary | ICD-10-CM | POA: Diagnosis not present

## 2023-09-07 DIAGNOSIS — K219 Gastro-esophageal reflux disease without esophagitis: Secondary | ICD-10-CM | POA: Diagnosis not present

## 2023-09-07 DIAGNOSIS — M4802 Spinal stenosis, cervical region: Secondary | ICD-10-CM | POA: Diagnosis not present

## 2023-09-07 DIAGNOSIS — S31109D Unspecified open wound of abdominal wall, unspecified quadrant without penetration into peritoneal cavity, subsequent encounter: Secondary | ICD-10-CM | POA: Diagnosis not present

## 2023-09-07 DIAGNOSIS — G8220 Paraplegia, unspecified: Secondary | ICD-10-CM | POA: Diagnosis not present

## 2023-09-07 DIAGNOSIS — R1310 Dysphagia, unspecified: Secondary | ICD-10-CM | POA: Diagnosis not present

## 2023-09-07 DIAGNOSIS — G894 Chronic pain syndrome: Secondary | ICD-10-CM | POA: Diagnosis not present

## 2023-09-07 DIAGNOSIS — I890 Lymphedema, not elsewhere classified: Secondary | ICD-10-CM | POA: Diagnosis not present

## 2023-09-07 DIAGNOSIS — T83511A Infection and inflammatory reaction due to indwelling urethral catheter, initial encounter: Secondary | ICD-10-CM | POA: Diagnosis not present

## 2023-09-07 DIAGNOSIS — K8689 Other specified diseases of pancreas: Secondary | ICD-10-CM | POA: Diagnosis not present

## 2023-09-07 DIAGNOSIS — G35 Multiple sclerosis: Secondary | ICD-10-CM | POA: Diagnosis not present

## 2023-09-07 DIAGNOSIS — Z435 Encounter for attention to cystostomy: Secondary | ICD-10-CM | POA: Diagnosis not present

## 2023-09-07 DIAGNOSIS — Z9049 Acquired absence of other specified parts of digestive tract: Secondary | ICD-10-CM | POA: Diagnosis not present

## 2023-09-08 ENCOUNTER — Encounter: Payer: Self-pay | Admitting: Internal Medicine

## 2023-09-11 NOTE — Telephone Encounter (Signed)
 Will discuss with Leisa when she returns. Maybe able to do virtual

## 2023-09-12 ENCOUNTER — Telehealth: Payer: Self-pay | Admitting: Family Medicine

## 2023-09-12 ENCOUNTER — Other Ambulatory Visit: Payer: Self-pay | Admitting: Diagnostic Neuroimaging

## 2023-09-12 NOTE — Telephone Encounter (Unsigned)
 Copied from CRM #[id number]. Topic: Appointments - Appointment Info/Confirmation >> Sep 12, 2023  1:01 PM Montie POUR wrote: Patient/patient representative is calling for information regarding an appointment.  She wants to see if she can change 09/14/23 to a video appointment. She bed bound. Please call her at [phone] to discuss. System would not let me change appointment to a video. Thanks

## 2023-09-13 DIAGNOSIS — M4802 Spinal stenosis, cervical region: Secondary | ICD-10-CM | POA: Diagnosis not present

## 2023-09-13 DIAGNOSIS — Z9181 History of falling: Secondary | ICD-10-CM | POA: Diagnosis not present

## 2023-09-13 DIAGNOSIS — Z9049 Acquired absence of other specified parts of digestive tract: Secondary | ICD-10-CM | POA: Diagnosis not present

## 2023-09-13 DIAGNOSIS — R1310 Dysphagia, unspecified: Secondary | ICD-10-CM | POA: Diagnosis not present

## 2023-09-13 DIAGNOSIS — G35 Multiple sclerosis: Secondary | ICD-10-CM | POA: Diagnosis not present

## 2023-09-13 DIAGNOSIS — N390 Urinary tract infection, site not specified: Secondary | ICD-10-CM | POA: Diagnosis not present

## 2023-09-13 DIAGNOSIS — H900 Conductive hearing loss, bilateral: Secondary | ICD-10-CM | POA: Diagnosis not present

## 2023-09-13 DIAGNOSIS — I081 Rheumatic disorders of both mitral and tricuspid valves: Secondary | ICD-10-CM | POA: Diagnosis not present

## 2023-09-13 DIAGNOSIS — S31109D Unspecified open wound of abdominal wall, unspecified quadrant without penetration into peritoneal cavity, subsequent encounter: Secondary | ICD-10-CM | POA: Diagnosis not present

## 2023-09-13 DIAGNOSIS — N319 Neuromuscular dysfunction of bladder, unspecified: Secondary | ICD-10-CM | POA: Diagnosis not present

## 2023-09-13 DIAGNOSIS — D649 Anemia, unspecified: Secondary | ICD-10-CM | POA: Diagnosis not present

## 2023-09-13 DIAGNOSIS — K581 Irritable bowel syndrome with constipation: Secondary | ICD-10-CM | POA: Diagnosis not present

## 2023-09-13 DIAGNOSIS — I890 Lymphedema, not elsewhere classified: Secondary | ICD-10-CM | POA: Diagnosis not present

## 2023-09-13 DIAGNOSIS — G8220 Paraplegia, unspecified: Secondary | ICD-10-CM | POA: Diagnosis not present

## 2023-09-13 DIAGNOSIS — K219 Gastro-esophageal reflux disease without esophagitis: Secondary | ICD-10-CM | POA: Diagnosis not present

## 2023-09-13 DIAGNOSIS — Z853 Personal history of malignant neoplasm of breast: Secondary | ICD-10-CM | POA: Diagnosis not present

## 2023-09-13 DIAGNOSIS — K8689 Other specified diseases of pancreas: Secondary | ICD-10-CM | POA: Diagnosis not present

## 2023-09-13 DIAGNOSIS — G894 Chronic pain syndrome: Secondary | ICD-10-CM | POA: Diagnosis not present

## 2023-09-13 DIAGNOSIS — T83511A Infection and inflammatory reaction due to indwelling urethral catheter, initial encounter: Secondary | ICD-10-CM | POA: Diagnosis not present

## 2023-09-13 DIAGNOSIS — Z435 Encounter for attention to cystostomy: Secondary | ICD-10-CM | POA: Diagnosis not present

## 2023-09-13 DIAGNOSIS — I5032 Chronic diastolic (congestive) heart failure: Secondary | ICD-10-CM | POA: Diagnosis not present

## 2023-09-13 NOTE — Telephone Encounter (Signed)
 Appt rescheduled with leisa for October 11, 2023

## 2023-09-14 ENCOUNTER — Ambulatory Visit: Admitting: Internal Medicine

## 2023-09-15 DIAGNOSIS — S31109D Unspecified open wound of abdominal wall, unspecified quadrant without penetration into peritoneal cavity, subsequent encounter: Secondary | ICD-10-CM | POA: Diagnosis not present

## 2023-09-15 DIAGNOSIS — Z853 Personal history of malignant neoplasm of breast: Secondary | ICD-10-CM | POA: Diagnosis not present

## 2023-09-15 DIAGNOSIS — I890 Lymphedema, not elsewhere classified: Secondary | ICD-10-CM | POA: Diagnosis not present

## 2023-09-15 DIAGNOSIS — Z9049 Acquired absence of other specified parts of digestive tract: Secondary | ICD-10-CM | POA: Diagnosis not present

## 2023-09-15 DIAGNOSIS — N390 Urinary tract infection, site not specified: Secondary | ICD-10-CM | POA: Diagnosis not present

## 2023-09-15 DIAGNOSIS — G8220 Paraplegia, unspecified: Secondary | ICD-10-CM | POA: Diagnosis not present

## 2023-09-15 DIAGNOSIS — R1310 Dysphagia, unspecified: Secondary | ICD-10-CM | POA: Diagnosis not present

## 2023-09-15 DIAGNOSIS — G35 Multiple sclerosis: Secondary | ICD-10-CM | POA: Diagnosis not present

## 2023-09-15 DIAGNOSIS — I5032 Chronic diastolic (congestive) heart failure: Secondary | ICD-10-CM | POA: Diagnosis not present

## 2023-09-15 DIAGNOSIS — G894 Chronic pain syndrome: Secondary | ICD-10-CM | POA: Diagnosis not present

## 2023-09-15 DIAGNOSIS — Z9181 History of falling: Secondary | ICD-10-CM | POA: Diagnosis not present

## 2023-09-15 DIAGNOSIS — D649 Anemia, unspecified: Secondary | ICD-10-CM | POA: Diagnosis not present

## 2023-09-15 DIAGNOSIS — N319 Neuromuscular dysfunction of bladder, unspecified: Secondary | ICD-10-CM | POA: Diagnosis not present

## 2023-09-15 DIAGNOSIS — H900 Conductive hearing loss, bilateral: Secondary | ICD-10-CM | POA: Diagnosis not present

## 2023-09-15 DIAGNOSIS — Z435 Encounter for attention to cystostomy: Secondary | ICD-10-CM | POA: Diagnosis not present

## 2023-09-15 DIAGNOSIS — T83511A Infection and inflammatory reaction due to indwelling urethral catheter, initial encounter: Secondary | ICD-10-CM | POA: Diagnosis not present

## 2023-09-15 DIAGNOSIS — K8689 Other specified diseases of pancreas: Secondary | ICD-10-CM | POA: Diagnosis not present

## 2023-09-15 DIAGNOSIS — M4802 Spinal stenosis, cervical region: Secondary | ICD-10-CM | POA: Diagnosis not present

## 2023-09-15 DIAGNOSIS — I081 Rheumatic disorders of both mitral and tricuspid valves: Secondary | ICD-10-CM | POA: Diagnosis not present

## 2023-09-15 DIAGNOSIS — K219 Gastro-esophageal reflux disease without esophagitis: Secondary | ICD-10-CM | POA: Diagnosis not present

## 2023-09-15 DIAGNOSIS — K581 Irritable bowel syndrome with constipation: Secondary | ICD-10-CM | POA: Diagnosis not present

## 2023-09-18 DIAGNOSIS — I5032 Chronic diastolic (congestive) heart failure: Secondary | ICD-10-CM | POA: Diagnosis not present

## 2023-09-18 DIAGNOSIS — Z9181 History of falling: Secondary | ICD-10-CM | POA: Diagnosis not present

## 2023-09-18 DIAGNOSIS — N390 Urinary tract infection, site not specified: Secondary | ICD-10-CM | POA: Diagnosis not present

## 2023-09-18 DIAGNOSIS — G8220 Paraplegia, unspecified: Secondary | ICD-10-CM | POA: Diagnosis not present

## 2023-09-18 DIAGNOSIS — N319 Neuromuscular dysfunction of bladder, unspecified: Secondary | ICD-10-CM | POA: Diagnosis not present

## 2023-09-18 DIAGNOSIS — Z853 Personal history of malignant neoplasm of breast: Secondary | ICD-10-CM | POA: Diagnosis not present

## 2023-09-18 DIAGNOSIS — K219 Gastro-esophageal reflux disease without esophagitis: Secondary | ICD-10-CM | POA: Diagnosis not present

## 2023-09-18 DIAGNOSIS — S31109D Unspecified open wound of abdominal wall, unspecified quadrant without penetration into peritoneal cavity, subsequent encounter: Secondary | ICD-10-CM | POA: Diagnosis not present

## 2023-09-18 DIAGNOSIS — G894 Chronic pain syndrome: Secondary | ICD-10-CM | POA: Diagnosis not present

## 2023-09-18 DIAGNOSIS — M4802 Spinal stenosis, cervical region: Secondary | ICD-10-CM | POA: Diagnosis not present

## 2023-09-18 DIAGNOSIS — T83511A Infection and inflammatory reaction due to indwelling urethral catheter, initial encounter: Secondary | ICD-10-CM | POA: Diagnosis not present

## 2023-09-18 DIAGNOSIS — Z435 Encounter for attention to cystostomy: Secondary | ICD-10-CM | POA: Diagnosis not present

## 2023-09-18 DIAGNOSIS — D649 Anemia, unspecified: Secondary | ICD-10-CM | POA: Diagnosis not present

## 2023-09-18 DIAGNOSIS — I081 Rheumatic disorders of both mitral and tricuspid valves: Secondary | ICD-10-CM | POA: Diagnosis not present

## 2023-09-18 DIAGNOSIS — K581 Irritable bowel syndrome with constipation: Secondary | ICD-10-CM | POA: Diagnosis not present

## 2023-09-18 DIAGNOSIS — I890 Lymphedema, not elsewhere classified: Secondary | ICD-10-CM | POA: Diagnosis not present

## 2023-09-18 DIAGNOSIS — R1310 Dysphagia, unspecified: Secondary | ICD-10-CM | POA: Diagnosis not present

## 2023-09-18 DIAGNOSIS — H900 Conductive hearing loss, bilateral: Secondary | ICD-10-CM | POA: Diagnosis not present

## 2023-09-18 DIAGNOSIS — Z9049 Acquired absence of other specified parts of digestive tract: Secondary | ICD-10-CM | POA: Diagnosis not present

## 2023-09-18 DIAGNOSIS — K8689 Other specified diseases of pancreas: Secondary | ICD-10-CM | POA: Diagnosis not present

## 2023-09-18 DIAGNOSIS — G35 Multiple sclerosis: Secondary | ICD-10-CM | POA: Diagnosis not present

## 2023-09-19 ENCOUNTER — Other Ambulatory Visit: Payer: Self-pay | Admitting: Urology

## 2023-09-19 DIAGNOSIS — N3289 Other specified disorders of bladder: Secondary | ICD-10-CM

## 2023-09-20 ENCOUNTER — Encounter: Attending: Physician Assistant | Admitting: Physician Assistant

## 2023-09-20 DIAGNOSIS — L98492 Non-pressure chronic ulcer of skin of other sites with fat layer exposed: Secondary | ICD-10-CM | POA: Diagnosis not present

## 2023-09-20 DIAGNOSIS — L89321 Pressure ulcer of left buttock, stage 1: Secondary | ICD-10-CM | POA: Diagnosis not present

## 2023-09-20 DIAGNOSIS — L24A0 Irritant contact dermatitis due to friction or contact with body fluids, unspecified: Secondary | ICD-10-CM | POA: Insufficient documentation

## 2023-09-20 DIAGNOSIS — G4089 Other seizures: Secondary | ICD-10-CM | POA: Insufficient documentation

## 2023-09-20 DIAGNOSIS — G35 Multiple sclerosis: Secondary | ICD-10-CM | POA: Diagnosis not present

## 2023-09-22 DIAGNOSIS — G35 Multiple sclerosis: Secondary | ICD-10-CM | POA: Diagnosis not present

## 2023-09-22 DIAGNOSIS — N319 Neuromuscular dysfunction of bladder, unspecified: Secondary | ICD-10-CM | POA: Diagnosis not present

## 2023-09-22 DIAGNOSIS — I5032 Chronic diastolic (congestive) heart failure: Secondary | ICD-10-CM | POA: Diagnosis not present

## 2023-09-22 DIAGNOSIS — I081 Rheumatic disorders of both mitral and tricuspid valves: Secondary | ICD-10-CM | POA: Diagnosis not present

## 2023-09-22 DIAGNOSIS — T83511A Infection and inflammatory reaction due to indwelling urethral catheter, initial encounter: Secondary | ICD-10-CM | POA: Diagnosis not present

## 2023-09-22 DIAGNOSIS — R1310 Dysphagia, unspecified: Secondary | ICD-10-CM | POA: Diagnosis not present

## 2023-09-22 DIAGNOSIS — G8220 Paraplegia, unspecified: Secondary | ICD-10-CM | POA: Diagnosis not present

## 2023-09-22 DIAGNOSIS — I890 Lymphedema, not elsewhere classified: Secondary | ICD-10-CM | POA: Diagnosis not present

## 2023-09-22 DIAGNOSIS — D649 Anemia, unspecified: Secondary | ICD-10-CM | POA: Diagnosis not present

## 2023-09-22 DIAGNOSIS — S31109D Unspecified open wound of abdominal wall, unspecified quadrant without penetration into peritoneal cavity, subsequent encounter: Secondary | ICD-10-CM | POA: Diagnosis not present

## 2023-09-22 DIAGNOSIS — Z853 Personal history of malignant neoplasm of breast: Secondary | ICD-10-CM | POA: Diagnosis not present

## 2023-09-22 DIAGNOSIS — K219 Gastro-esophageal reflux disease without esophagitis: Secondary | ICD-10-CM | POA: Diagnosis not present

## 2023-09-22 DIAGNOSIS — K581 Irritable bowel syndrome with constipation: Secondary | ICD-10-CM | POA: Diagnosis not present

## 2023-09-22 DIAGNOSIS — G894 Chronic pain syndrome: Secondary | ICD-10-CM | POA: Diagnosis not present

## 2023-09-22 DIAGNOSIS — Z9049 Acquired absence of other specified parts of digestive tract: Secondary | ICD-10-CM | POA: Diagnosis not present

## 2023-09-22 DIAGNOSIS — K8689 Other specified diseases of pancreas: Secondary | ICD-10-CM | POA: Diagnosis not present

## 2023-09-22 DIAGNOSIS — N390 Urinary tract infection, site not specified: Secondary | ICD-10-CM | POA: Diagnosis not present

## 2023-09-22 DIAGNOSIS — H900 Conductive hearing loss, bilateral: Secondary | ICD-10-CM | POA: Diagnosis not present

## 2023-09-22 DIAGNOSIS — Z9181 History of falling: Secondary | ICD-10-CM | POA: Diagnosis not present

## 2023-09-22 DIAGNOSIS — M4802 Spinal stenosis, cervical region: Secondary | ICD-10-CM | POA: Diagnosis not present

## 2023-09-22 DIAGNOSIS — Z435 Encounter for attention to cystostomy: Secondary | ICD-10-CM | POA: Diagnosis not present

## 2023-09-25 ENCOUNTER — Ambulatory Visit: Admitting: Physician Assistant

## 2023-09-25 DIAGNOSIS — Z9181 History of falling: Secondary | ICD-10-CM | POA: Diagnosis not present

## 2023-09-25 DIAGNOSIS — M4802 Spinal stenosis, cervical region: Secondary | ICD-10-CM | POA: Diagnosis not present

## 2023-09-25 DIAGNOSIS — G8220 Paraplegia, unspecified: Secondary | ICD-10-CM | POA: Diagnosis not present

## 2023-09-25 DIAGNOSIS — K219 Gastro-esophageal reflux disease without esophagitis: Secondary | ICD-10-CM | POA: Diagnosis not present

## 2023-09-25 DIAGNOSIS — N319 Neuromuscular dysfunction of bladder, unspecified: Secondary | ICD-10-CM | POA: Diagnosis not present

## 2023-09-25 DIAGNOSIS — K581 Irritable bowel syndrome with constipation: Secondary | ICD-10-CM | POA: Diagnosis not present

## 2023-09-25 DIAGNOSIS — G35 Multiple sclerosis: Secondary | ICD-10-CM | POA: Diagnosis not present

## 2023-09-25 DIAGNOSIS — D649 Anemia, unspecified: Secondary | ICD-10-CM | POA: Diagnosis not present

## 2023-09-25 DIAGNOSIS — I5032 Chronic diastolic (congestive) heart failure: Secondary | ICD-10-CM | POA: Diagnosis not present

## 2023-09-25 DIAGNOSIS — Z435 Encounter for attention to cystostomy: Secondary | ICD-10-CM | POA: Diagnosis not present

## 2023-09-25 DIAGNOSIS — Z9049 Acquired absence of other specified parts of digestive tract: Secondary | ICD-10-CM | POA: Diagnosis not present

## 2023-09-25 DIAGNOSIS — I081 Rheumatic disorders of both mitral and tricuspid valves: Secondary | ICD-10-CM | POA: Diagnosis not present

## 2023-09-25 DIAGNOSIS — I890 Lymphedema, not elsewhere classified: Secondary | ICD-10-CM | POA: Diagnosis not present

## 2023-09-25 DIAGNOSIS — Z853 Personal history of malignant neoplasm of breast: Secondary | ICD-10-CM | POA: Diagnosis not present

## 2023-09-25 DIAGNOSIS — K8689 Other specified diseases of pancreas: Secondary | ICD-10-CM | POA: Diagnosis not present

## 2023-09-25 DIAGNOSIS — H900 Conductive hearing loss, bilateral: Secondary | ICD-10-CM | POA: Diagnosis not present

## 2023-09-25 DIAGNOSIS — N390 Urinary tract infection, site not specified: Secondary | ICD-10-CM | POA: Diagnosis not present

## 2023-09-25 DIAGNOSIS — S31109D Unspecified open wound of abdominal wall, unspecified quadrant without penetration into peritoneal cavity, subsequent encounter: Secondary | ICD-10-CM | POA: Diagnosis not present

## 2023-09-25 DIAGNOSIS — G894 Chronic pain syndrome: Secondary | ICD-10-CM | POA: Diagnosis not present

## 2023-09-25 DIAGNOSIS — R1310 Dysphagia, unspecified: Secondary | ICD-10-CM | POA: Diagnosis not present

## 2023-09-25 DIAGNOSIS — T83511A Infection and inflammatory reaction due to indwelling urethral catheter, initial encounter: Secondary | ICD-10-CM | POA: Diagnosis not present

## 2023-09-26 DIAGNOSIS — N319 Neuromuscular dysfunction of bladder, unspecified: Secondary | ICD-10-CM | POA: Diagnosis not present

## 2023-09-26 DIAGNOSIS — G35 Multiple sclerosis: Secondary | ICD-10-CM | POA: Diagnosis not present

## 2023-09-26 DIAGNOSIS — N390 Urinary tract infection, site not specified: Secondary | ICD-10-CM | POA: Diagnosis not present

## 2023-09-26 DIAGNOSIS — R1310 Dysphagia, unspecified: Secondary | ICD-10-CM | POA: Diagnosis not present

## 2023-09-26 DIAGNOSIS — S31109D Unspecified open wound of abdominal wall, unspecified quadrant without penetration into peritoneal cavity, subsequent encounter: Secondary | ICD-10-CM | POA: Diagnosis not present

## 2023-09-26 DIAGNOSIS — I890 Lymphedema, not elsewhere classified: Secondary | ICD-10-CM | POA: Diagnosis not present

## 2023-09-26 DIAGNOSIS — M4802 Spinal stenosis, cervical region: Secondary | ICD-10-CM | POA: Diagnosis not present

## 2023-09-26 DIAGNOSIS — Z435 Encounter for attention to cystostomy: Secondary | ICD-10-CM | POA: Diagnosis not present

## 2023-09-26 DIAGNOSIS — G894 Chronic pain syndrome: Secondary | ICD-10-CM | POA: Diagnosis not present

## 2023-09-26 DIAGNOSIS — T83511A Infection and inflammatory reaction due to indwelling urethral catheter, initial encounter: Secondary | ICD-10-CM | POA: Diagnosis not present

## 2023-09-26 DIAGNOSIS — G8220 Paraplegia, unspecified: Secondary | ICD-10-CM | POA: Diagnosis not present

## 2023-09-26 DIAGNOSIS — Z9181 History of falling: Secondary | ICD-10-CM | POA: Diagnosis not present

## 2023-09-26 DIAGNOSIS — K581 Irritable bowel syndrome with constipation: Secondary | ICD-10-CM | POA: Diagnosis not present

## 2023-09-26 DIAGNOSIS — H900 Conductive hearing loss, bilateral: Secondary | ICD-10-CM | POA: Diagnosis not present

## 2023-09-26 DIAGNOSIS — K8689 Other specified diseases of pancreas: Secondary | ICD-10-CM | POA: Diagnosis not present

## 2023-09-26 DIAGNOSIS — I5032 Chronic diastolic (congestive) heart failure: Secondary | ICD-10-CM | POA: Diagnosis not present

## 2023-09-26 DIAGNOSIS — K219 Gastro-esophageal reflux disease without esophagitis: Secondary | ICD-10-CM | POA: Diagnosis not present

## 2023-09-26 DIAGNOSIS — I081 Rheumatic disorders of both mitral and tricuspid valves: Secondary | ICD-10-CM | POA: Diagnosis not present

## 2023-09-26 DIAGNOSIS — Z9049 Acquired absence of other specified parts of digestive tract: Secondary | ICD-10-CM | POA: Diagnosis not present

## 2023-09-26 DIAGNOSIS — D649 Anemia, unspecified: Secondary | ICD-10-CM | POA: Diagnosis not present

## 2023-09-26 DIAGNOSIS — Z853 Personal history of malignant neoplasm of breast: Secondary | ICD-10-CM | POA: Diagnosis not present

## 2023-09-29 DIAGNOSIS — Z9049 Acquired absence of other specified parts of digestive tract: Secondary | ICD-10-CM | POA: Diagnosis not present

## 2023-09-29 DIAGNOSIS — S31109D Unspecified open wound of abdominal wall, unspecified quadrant without penetration into peritoneal cavity, subsequent encounter: Secondary | ICD-10-CM | POA: Diagnosis not present

## 2023-09-29 DIAGNOSIS — N390 Urinary tract infection, site not specified: Secondary | ICD-10-CM | POA: Diagnosis not present

## 2023-09-29 DIAGNOSIS — I890 Lymphedema, not elsewhere classified: Secondary | ICD-10-CM | POA: Diagnosis not present

## 2023-09-29 DIAGNOSIS — D649 Anemia, unspecified: Secondary | ICD-10-CM | POA: Diagnosis not present

## 2023-09-29 DIAGNOSIS — I081 Rheumatic disorders of both mitral and tricuspid valves: Secondary | ICD-10-CM | POA: Diagnosis not present

## 2023-09-29 DIAGNOSIS — R1310 Dysphagia, unspecified: Secondary | ICD-10-CM | POA: Diagnosis not present

## 2023-09-29 DIAGNOSIS — Z435 Encounter for attention to cystostomy: Secondary | ICD-10-CM | POA: Diagnosis not present

## 2023-09-29 DIAGNOSIS — G8220 Paraplegia, unspecified: Secondary | ICD-10-CM | POA: Diagnosis not present

## 2023-09-29 DIAGNOSIS — I5032 Chronic diastolic (congestive) heart failure: Secondary | ICD-10-CM | POA: Diagnosis not present

## 2023-09-29 DIAGNOSIS — K581 Irritable bowel syndrome with constipation: Secondary | ICD-10-CM | POA: Diagnosis not present

## 2023-09-29 DIAGNOSIS — G35 Multiple sclerosis: Secondary | ICD-10-CM | POA: Diagnosis not present

## 2023-09-29 DIAGNOSIS — Z853 Personal history of malignant neoplasm of breast: Secondary | ICD-10-CM | POA: Diagnosis not present

## 2023-09-29 DIAGNOSIS — N319 Neuromuscular dysfunction of bladder, unspecified: Secondary | ICD-10-CM | POA: Diagnosis not present

## 2023-09-29 DIAGNOSIS — K8689 Other specified diseases of pancreas: Secondary | ICD-10-CM | POA: Diagnosis not present

## 2023-09-29 DIAGNOSIS — H900 Conductive hearing loss, bilateral: Secondary | ICD-10-CM | POA: Diagnosis not present

## 2023-09-29 DIAGNOSIS — T83511A Infection and inflammatory reaction due to indwelling urethral catheter, initial encounter: Secondary | ICD-10-CM | POA: Diagnosis not present

## 2023-09-29 DIAGNOSIS — Z9181 History of falling: Secondary | ICD-10-CM | POA: Diagnosis not present

## 2023-09-29 DIAGNOSIS — M4802 Spinal stenosis, cervical region: Secondary | ICD-10-CM | POA: Diagnosis not present

## 2023-09-29 DIAGNOSIS — K219 Gastro-esophageal reflux disease without esophagitis: Secondary | ICD-10-CM | POA: Diagnosis not present

## 2023-09-29 DIAGNOSIS — G894 Chronic pain syndrome: Secondary | ICD-10-CM | POA: Diagnosis not present

## 2023-10-03 DIAGNOSIS — Z435 Encounter for attention to cystostomy: Secondary | ICD-10-CM | POA: Diagnosis not present

## 2023-10-03 DIAGNOSIS — Z9049 Acquired absence of other specified parts of digestive tract: Secondary | ICD-10-CM | POA: Diagnosis not present

## 2023-10-03 DIAGNOSIS — H900 Conductive hearing loss, bilateral: Secondary | ICD-10-CM | POA: Diagnosis not present

## 2023-10-03 DIAGNOSIS — G35 Multiple sclerosis: Secondary | ICD-10-CM | POA: Diagnosis not present

## 2023-10-03 DIAGNOSIS — R1310 Dysphagia, unspecified: Secondary | ICD-10-CM | POA: Diagnosis not present

## 2023-10-03 DIAGNOSIS — Z9181 History of falling: Secondary | ICD-10-CM | POA: Diagnosis not present

## 2023-10-03 DIAGNOSIS — D649 Anemia, unspecified: Secondary | ICD-10-CM | POA: Diagnosis not present

## 2023-10-03 DIAGNOSIS — S31109D Unspecified open wound of abdominal wall, unspecified quadrant without penetration into peritoneal cavity, subsequent encounter: Secondary | ICD-10-CM | POA: Diagnosis not present

## 2023-10-03 DIAGNOSIS — N390 Urinary tract infection, site not specified: Secondary | ICD-10-CM | POA: Diagnosis not present

## 2023-10-03 DIAGNOSIS — I890 Lymphedema, not elsewhere classified: Secondary | ICD-10-CM | POA: Diagnosis not present

## 2023-10-03 DIAGNOSIS — Z466 Encounter for fitting and adjustment of urinary device: Secondary | ICD-10-CM | POA: Diagnosis not present

## 2023-10-03 DIAGNOSIS — G894 Chronic pain syndrome: Secondary | ICD-10-CM | POA: Diagnosis not present

## 2023-10-03 DIAGNOSIS — N319 Neuromuscular dysfunction of bladder, unspecified: Secondary | ICD-10-CM | POA: Diagnosis not present

## 2023-10-03 DIAGNOSIS — K8689 Other specified diseases of pancreas: Secondary | ICD-10-CM | POA: Diagnosis not present

## 2023-10-03 DIAGNOSIS — I080 Rheumatic disorders of both mitral and aortic valves: Secondary | ICD-10-CM | POA: Diagnosis not present

## 2023-10-03 DIAGNOSIS — Z853 Personal history of malignant neoplasm of breast: Secondary | ICD-10-CM | POA: Diagnosis not present

## 2023-10-03 DIAGNOSIS — G8220 Paraplegia, unspecified: Secondary | ICD-10-CM | POA: Diagnosis not present

## 2023-10-03 DIAGNOSIS — I5032 Chronic diastolic (congestive) heart failure: Secondary | ICD-10-CM | POA: Diagnosis not present

## 2023-10-03 DIAGNOSIS — M4802 Spinal stenosis, cervical region: Secondary | ICD-10-CM | POA: Diagnosis not present

## 2023-10-03 DIAGNOSIS — K219 Gastro-esophageal reflux disease without esophagitis: Secondary | ICD-10-CM | POA: Diagnosis not present

## 2023-10-03 DIAGNOSIS — K581 Irritable bowel syndrome with constipation: Secondary | ICD-10-CM | POA: Diagnosis not present

## 2023-10-04 ENCOUNTER — Encounter: Admitting: Internal Medicine

## 2023-10-04 DIAGNOSIS — L89321 Pressure ulcer of left buttock, stage 1: Secondary | ICD-10-CM | POA: Diagnosis not present

## 2023-10-04 DIAGNOSIS — L24A0 Irritant contact dermatitis due to friction or contact with body fluids, unspecified: Secondary | ICD-10-CM | POA: Diagnosis not present

## 2023-10-04 DIAGNOSIS — L98492 Non-pressure chronic ulcer of skin of other sites with fat layer exposed: Secondary | ICD-10-CM | POA: Diagnosis not present

## 2023-10-04 DIAGNOSIS — G35 Multiple sclerosis: Secondary | ICD-10-CM | POA: Diagnosis not present

## 2023-10-05 ENCOUNTER — Other Ambulatory Visit: Payer: Self-pay | Admitting: Diagnostic Neuroimaging

## 2023-10-05 ENCOUNTER — Ambulatory Visit: Payer: 59

## 2023-10-05 DIAGNOSIS — Z0000 Encounter for general adult medical examination without abnormal findings: Secondary | ICD-10-CM | POA: Diagnosis not present

## 2023-10-05 DIAGNOSIS — R030 Elevated blood-pressure reading, without diagnosis of hypertension: Secondary | ICD-10-CM

## 2023-10-05 DIAGNOSIS — G35 Multiple sclerosis: Secondary | ICD-10-CM

## 2023-10-05 DIAGNOSIS — Z760 Encounter for issue of repeat prescription: Secondary | ICD-10-CM

## 2023-10-05 NOTE — Patient Instructions (Signed)
 [name] , Thank you for taking time out of your busy schedule to complete your Annual Wellness Visit with me. I enjoyed our conversation and look forward to speaking with you again next year. I, as well as your care team,  appreciate your ongoing commitment to your health goals. Please review the following plan we discussed and let me know if I can assist you in the future.  Follow up Visits: 10/10/24 @ 8:10 AM BY PHONE We will see or speak with you next year for your Next Medicare AWV with our clinical staff Have you seen your provider in the last 6 months (3 months if uncontrolled diabetes)? Yes  Clinician Recommendations:  Aim for 30 minutes of exercise or brisk walking, 6-8 glasses of water, and 5 servings of fruits and vegetables each day. TAKE CARE!      This is a list of the screenings recommended for you:  Health Maintenance  Topic Date Due   Cologuard (Stool DNA test)  Never done   Pneumococcal Vaccine for age over 6 (2 of 2 - PCV) 02/04/2019   Pap with HPV screening  09/25/2021   Mammogram  09/28/2022   COVID-19 Vaccine (4 - 2024-25 season) 10/09/2022   Flu Shot  09/08/2023   Zoster (Shingles) Vaccine (1 of 2) 10/24/2023*   Medicare Annual Wellness Visit  10/04/2024   Hepatitis C Screening  Completed   HIV Screening  Completed   Hepatitis B Vaccine  Aged Out   HPV Vaccine  Aged Out   Meningitis B Vaccine  Aged Out   DTaP/Tdap/Td vaccine  Discontinued   Colon Cancer Screening  Discontinued  *Topic was postponed. The date shown is not the original due date.    Advanced directives: (ACP Link)Information on Advanced Care Planning can be found at [place]  Secretary of [facility] Advance Health Care Directives Advance Health Care Directives. http://guzman.[email or web address]/  Advance Care Planning is important because it:  [x]  Makes sure you receive the medical care that is consistent with your values, goals, and preferences  [x]  It provides guidance to your family and loved ones and reduces their  decisional burden about whether or not they are making the right decisions based on your wishes.  Follow the link provided in your after visit summary or read over the paperwork we have mailed to you to help you started getting your Advance Directives in place. If you need assistance in completing these, please reach out to us  so that we can help you!

## 2023-10-05 NOTE — Progress Notes (Signed)
 Subjective:   Rebecca [name] is a 63 y.o. who presents for a Medicare Wellness preventive visit.  As a reminder, Annual Wellness Visits don't include a physical exam, and some assessments may be limited, especially if this visit is performed virtually. We may recommend an in-person follow-up visit with your provider if needed.  Visit Complete: Virtual I connected with  YANNELY KINTZEL on 10/05/23 by a audio enabled telemedicine application and verified that I am speaking with the correct person using two identifiers.  Patient Location: Home  Provider Location: Home Office  I discussed the limitations of evaluation and management by telemedicine. The patient expressed understanding and agreed to proceed.  Vital Signs: Because this visit was a virtual/telehealth visit, some criteria may be missing or patient reported. Any vitals not documented were not able to be obtained and vitals that have been documented are patient reported.  VideoDeclined- This patient declined Librarian, academic. Therefore the visit was completed with audio only.  Persons Participating in Visit: Patient.  AWV Questionnaire: No: Patient Medicare AWV questionnaire was not completed prior to this visit.  Cardiac Risk Factors include: advanced age (>25men, >24 women);obesity (BMI >30kg/m2);sedentary lifestyle     Objective:    There were no vitals filed for this visit. There is no height or weight on file to calculate BMI.     10/05/2023    8:16 AM 08/17/2023    9:32 AM 07/17/2023    9:43 PM 07/13/2023    8:05 PM 06/05/2023    4:39 PM 05/05/2023   12:48 PM 04/28/2023    7:35 PM  Advanced Directives  Does Patient Have a Medical Advance Directive? No No No No No No No  Would patient like information on creating a medical advance directive? No - Patient declined No - Patient declined    No - Patient declined     Current Medications (verified) Outpatient Encounter Medications as of  10/05/2023  Medication Sig   amantadine  (SYMMETREL ) 100 MG capsule TAKE 1 CAPSULE BY MOUTH THREE TIMES A DAY   baclofen  (LIORESAL ) 10 MG tablet TAKE 1-2 TABLETS (10-20 MG TOTAL) BY MOUTH 3 (THREE) TIMES DAILY.   gluconic acid-citric acid (RENACIDIN ) irrigation Insert 30 mL into suprapubic tube and clamp tube for 10 minutes and then drain twice daily   Glucosamine-Chondroitin (COSAMIN DS PO) Take 1 tablet by mouth 3 (three) times daily.   levETIRAcetam  (KEPPRA ) 500 MG tablet TAKE 1 TABLET BY MOUTH TWICE A DAY   loratadine  (CLARITIN ) 10 MG tablet Take 10 mg by mouth daily.   Misc Natural Products (LEG VEIN & CIRCULATION) TABS Take 1 tablet by mouth 2 (two) times daily.    Multiple Vitamins-Minerals (MULTIVITAMIN PO) Take 1 tablet by mouth daily.    nystatin  (MYCOSTATIN /NYSTOP ) powder Apply 1 application topically 3 (three) times daily as needed. For rash/raw skin as needed   omeprazole  (PRILOSEC) 40 MG capsule TAKE 1 CAPSULE (40 MG TOTAL) BY MOUTH DAILY.   oxybutynin  (DITROPAN -XL) 10 MG 24 hr tablet TAKE 1 TABLET BY MOUTH EVERY DAY   REBIF  REBIDOSE 44 MCG/0.5ML SOAJ Inject 1 pen under the skin 3 times per week   tiZANidine  (ZANAFLEX ) 4 MG tablet Take 1 tablet (4 mg total) by mouth at bedtime.   Turmeric 500 MG CAPS Take 500 mg by mouth daily.   Vibegron  (GEMTESA ) 75 MG TABS Take 1 tablet (75 mg total) by mouth daily.   Vibegron  (GEMTESA ) 75 MG TABS Take 1 tablet (75 mg total)  by mouth daily.   No facility-administered encounter medications on file as of 10/05/2023.    Allergies (verified) Fentanyl  and Sulfa antibiotics   History: Past Medical History:  Diagnosis Date   Allergy    Aortic valve disease    Mild AS / AI - most recent Echo demonstrated tricuspid aortic valve.   Bacterial endocarditis    History of .   Bilateral impacted cerumen 06/29/2021   Bilateral lower extremity edema    Noncardiac.  Chronic. LE Venous dopplers - negative for DVT.; Echocardiogram January 2016: Normal EF  with normal wall motion and valve function. Only grade 1 diastolic dysfunction. EF 60-65%. Mild MR   Breast cancer (HCC) 12/31/2013   Right breast, 12:00, 1.5 cm, T1c,N0 invasive mammary carcinoma, triple negative. --> Rx with Chemo   Cervical stenosis of spine    GERD (gastroesophageal reflux disease) April or May 2022   Heart murmur 2013   Herpes zoster    IBS (irritable bowel syndrome)    Lymphedema    has legs wrapped at [facility]   Multiple sclerosis (HCC) 2001   Walks from room to room @ home; but Wheelchair when going out.   Neuromuscular disorder (HCC)    MS   PONV (postoperative nausea and vomiting)    Related to Fentanyl    Seizures (HCC)    Takes Keppra    SOB (shortness of breath) 03/01/2013   Syncope and collapse    Urinary tract infection associated with indwelling urethral catheter (HCC) 02/17/2020   Past Surgical History:  Procedure Laterality Date   ANKLE SURGERY     Left   ANKLE SURGERY     ANTERIOR CERVICAL DECOMP/DISCECTOMY FUSION  11/17/2011   Procedure: ANTERIOR CERVICAL DECOMPRESSION/DISCECTOMY FUSION 2 LEVELS;  Surgeon: Fairy Levels, MD;  Location: [facility];  Service: Neurosurgery;  Laterality: N/A;  Cervical Five-Six Six-Seven Anterior cervical decompression/diskectomy/fusion   BREAST BIOPSY Right 12/31/2013   invasive mammary   BREAST SURGERY Right 02/03/2014   Right simple mastectomy with sentinel node biopsy.   CHOLECYSTECTOMY     COLONOSCOPY  2014   ESOPHAGOGASTRODUODENOSCOPY (EGD) WITH PROPOFOL  N/A 07/16/2020   Procedure: ESOPHAGOGASTRODUODENOSCOPY (EGD) WITH PROPOFOL ;  Surgeon: Janalyn Keene NOVAK, MD;  Location: [facility] ENDOSCOPY;  Service: Endoscopy;  Laterality: N/A;  Patient has MS and will need assistance   HYSTEROSCOPY WITH D & C N/A 11/27/2018   Procedure: DILATATION AND CURETTAGE /HYSTEROSCOPY;  Surgeon: Arloa Lamar SQUIBB, MD;  Location: [facility] ORS;  Service: Gynecology;  Laterality: N/A;   IR CATHETER TUBE CHANGE  05/05/2023   IR CYSTOSTOMY TUBE  CHANGE COMPLICATED W IMG  06/09/2023   IR CYSTOSTOMY TUBE PLACEMENT/BLADDER ASPIRATION  03/23/2023   Lower extremity venous Dopplers  02/27/2013   No LE DVT   MASTECTOMY Right 2015   ORIF TIBIA PLATEAU Left 12/09/2020   Procedure: LEFT OPEN REDUCTION INTERNAL FIXATION (ORIF) TIBIAL PLATEAU;  Surgeon: Kendal Franky SQUIBB, MD;  Location: [facility] OR;  Service: Orthopedics;  Laterality: Left;   Port a cath insertion Right 01/19/2010   PORT-A-CATH REMOVAL     right   PORT-A-CATH REMOVAL Right 09/03/2013   Procedure: REMOVAL PORT-A-CATH;  Surgeon: Redell LITTIE Door, MD;  Location: [facility] OR;  Service: Vascular;  Laterality: Right;   SPINE SURGERY  Cervical steinois Dr. Levels 2014 I think?   TONSILLECTOMY     TRANSTHORACIC ECHOCARDIOGRAM  03/2013; 02/2014   a) Normal LV size and function with EF 60-65%.; Cannot exclude bicuspid aortic valve with mild AS and mild AI.; b) Normal  EF with normal wall motion and valve function x Mild MR. G2 DD. EF 60-65%. Tricuspid AoV   UPPER GI ENDOSCOPY  2014   Family History  Problem Relation Age of Onset   Cancer Father        skin   Heart disease Father    Heart attack Father        heart attack in his 63's   Thyroid  disease Sister    Ovarian cancer Cousin    Breast cancer Maternal Aunt 60   Breast cancer Maternal Grandmother 54   Bladder Cancer Neg Hx    Kidney cancer Neg Hx    Social History   Socioeconomic History   Marital status: Married    Spouse name: don   Number of children: 0   Years of education: 12   Highest education level: 12th grade  Occupational History   Occupation: disability  Tobacco Use   Smoking status: Never   Smokeless tobacco: Never   Tobacco comments:    Never smoked. My mom smoked. Passed 04/18/20. Dad smoked, quit 1973. Sister smokes.  Vaping Use   Vaping status: Never Used  Substance and Sexual Activity   Alcohol use: No   Drug use: No   Sexual activity: Not Currently    Partners: Male    Birth control/protection: None  Other  Topics Concern   Not on file  Social History Narrative   She is married. Recently moved back to   after being in [place]  for some time. She is accompanied by her husband and aunt.Never smoked. Never used alcohol.      1/29: husband currently in SNF in [place]. Aunt lives with her but unable to help care for her. Home health comes in once daily to change her. She does not get out of her recliner per patient   Social Drivers of Health   Financial Resource Strain: Low Risk  (10/05/2023)   Overall Financial Resource Strain (CARDIA)    Difficulty of Paying Living Expenses: Not hard at all  Food Insecurity: No Food Insecurity (10/05/2023)   Hunger Vital Sign    Worried About Running Out of Food in the Last Year: Never true    Ran Out of Food in the Last Year: Never true  Transportation Needs: No Transportation Needs (10/05/2023)   PRAPARE - Administrator, Civil Service (Medical): No    Lack of Transportation (Non-Medical): No  Physical Activity: Insufficiently Active (10/05/2023)   Exercise Vital Sign    Days of Exercise per Week: 3 days    Minutes of Exercise per Session: 20 min  Stress: No Stress Concern Present (10/05/2023)   [employer] of Occupational Health - Occupational Stress Questionnaire    Feeling of Stress: Not at all  Social Connections: Socially Isolated (10/05/2023)   Social Connection and Isolation Panel    Frequency of Communication with Friends and Family: Once a week    Frequency of Social Gatherings with Friends and Family: Once a week    Attends Religious Services: Never    Database administrator or Organizations: No    Attends Banker Meetings: Never    Marital Status: Married    Tobacco Counseling Counseling given: Not Answered Tobacco comments: Never smoked. My mom smoked. Passed 04/18/20. Dad smoked, quit 1973. Sister smokes.    Clinical Intake:  Pre-visit preparation completed: Yes  Pain : No/denies  pain     BMI - recorded: 35.2 Nutritional Status: BMI > 30  Obese Nutritional Risks: None Diabetes: No  Lab Results  Component Value Date   HGBA1C 5.8 (H) 12/07/2022   HGBA1C 5.1 09/27/2019     How often do you need to have someone help you when you read instructions, pamphlets, or other written materials from your doctor or pharmacy?: 1 - Never  Interpreter Needed?: No  Information entered by :: JHONNIE DAS, LPN   Activities of Daily Living     10/05/2023    8:17 AM 10/01/2023   11:24 AM  In your present state of health, do you have any difficulty performing the following activities:  Hearing? 0 0  Vision? 1 0  Difficulty concentrating or making decisions? 0 0  Walking or climbing stairs? 1 1  Dressing or bathing? 0   Doing errands, shopping? 1 1  Preparing Food and eating ? Y Y  Using the Toilet? Y Y  In the past six months, have you accidently leaked urine? Y Y  Do you have problems with loss of bowel control? Y Y  Managing your Medications? N N  Managing your Finances? N N  Housekeeping or managing your Housekeeping? CINDERELLA CINDERELLA    Patient Care Team: Leavy Mole, PA-C as PCP - General (Family Medicine) Richarda Shell, MD as Referring Physician Margaret Eduard SAUNDERS, MD as Consulting Physician (Neurology) Jacobo Evalene PARAS, MD as Consulting Physician (Oncology) Anner Alm ORN, MD as Consulting Physician (Cardiology) Perla Evalene PARAS, MD as Consulting Physician (Cardiology) McGowan, Shannon A, PA-C as Physician Assistant (Urology) Valora Mliss BRAVO, [facility] (Inactive) (Pharmacist) Pa, [facility] (Optometry)  I have updated your Care Teams any recent Medical Services you may have received from other providers in the past year.     Assessment:   This is a routine wellness examination for Mck[place]na.  Hearing/Vision screen Hearing Screening - Comments:: NO AIDS Vision Screening - Comments:: WEARS GLASSES ALL DAY- [facility]   Goals Addressed              This Visit's Progress    DIET - EAT MORE FRUITS AND VEGETABLES         Depression Screen     10/05/2023    8:15 AM 02/20/2023   11:09 AM 12/07/2022   10:12 AM 09/29/2022    8:31 AM 07/07/2022   10:55 AM 08/17/2021    1:22 PM 08/13/2020    2:43 PM  PHQ 2/9 Scores  PHQ - 2 Score 0 0 0 0 0 0 0  PHQ- 9 Score 0 0 0  0 0     Fall Risk     10/01/2023   11:24 AM 02/20/2023   11:09 AM 12/07/2022   10:12 AM 09/26/2022    7:57 AM 08/17/2021    1:21 PM  Fall Risk   Falls in the past year? 0 0 0 0 0  Number falls in past yr: 0 0 0 0 0  Injury with Fall? 1 0 0 1 0  Risk for fall due to : Impaired mobility;Impaired balance/gait;Impaired vision  No Fall Risks Impaired balance/gait;Impaired mobility Impaired mobility  Follow up Falls evaluation completed;Falls prevention discussed  Falls prevention discussed;Education provided;Falls evaluation completed Falls prevention discussed;Education provided Falls prevention discussed      Data saved with a previous flowsheet row definition    MEDICARE RISK AT HOME:  Medicare Risk at Home Any stairs in or around the home?: No If so, are there any without handrails?: No Home free of loose throw rugs in walkways, pet beds,  electrical cords, etc?: Yes Adequate lighting in your home to reduce risk of falls?: Yes Life alert?: Yes Use of a cane, walker or w/c?: Yes (W/C) Grab bars in the bathroom?: No Shower chair or bench in shower?: No Elevated toilet seat or a handicapped toilet?: Yes  TIMED UP AND GO:  Was the test performed?  No  Cognitive Function: 6CIT completed        10/05/2023    8:19 AM 09/29/2022    8:37 AM 08/17/2021    2:26 PM  6CIT Screen  What Year? 0 points 0 points 0 points  What month? 0 points 0 points 0 points  What time? 0 points 0 points 0 points  Count back from 20 0 points 0 points 0 points  Months in reverse 0 points 0 points 0 points  Repeat phrase 0 points 0 points 0 points  Total Score 0 points 0 points 0 points     Immunizations Immunization History  Administered Date(s) Administered   Influenza,inj,Quad PF,6+ Mos 10/24/2014, 11/25/2015, 10/28/2016, 12/26/2017, 11/20/2018, 12/01/2020   Moderna Covid-19 Vaccine Bivalent Booster 73yrs & up 07/15/2021   Moderna Sars-Covid-2 Vaccination 07/02/2019, 07/31/2019   Pneumococcal Polysaccharide-23 02/03/2018   Tdap 04/09/2012   Unspecified SARS-COV-2 Vaccination 06/07/2021    Screening Tests Health Maintenance  Topic Date Due   Fecal DNA (Cologuard)  Never done   Pneumococcal Vaccine: 50+ Years (2 of 2 - PCV) 02/04/2019   Cervical Cancer Screening (HPV/Pap Cotest)  09/25/2021   MAMMOGRAM  09/28/2022   COVID-19 Vaccine (4 - 2024-25 season) 10/09/2022   INFLUENZA VACCINE  09/08/2023   Zoster Vaccines- Shingrix (1 of 2) 10/24/2023 (Originally 04/16/1979)   Medicare Annual Wellness (AWV)  10/04/2024   Hepatitis C Screening  Completed   HIV Screening  Completed   Hepatitis B Vaccines 19-59 Average Risk  Aged Out   HPV VACCINES  Aged Out   Meningococcal B Vaccine  Aged Out   DTaP/Tdap/Td  Discontinued   Colonoscopy  Discontinued    Health Maintenance  Health Maintenance Due  Topic Date Due   Fecal DNA (Cologuard)  Never done   Pneumococcal Vaccine: 50+ Years (2 of 2 - PCV) 02/04/2019   Cervical Cancer Screening (HPV/Pap Cotest)  09/25/2021   MAMMOGRAM  09/28/2022   COVID-19 Vaccine (4 - 2024-25 season) 10/09/2022   INFLUENZA VACCINE  09/08/2023   Health Maintenance Items Addressed: UNABLE TO GET TO OFFICE FOR SHOTS, MAMMOGRAM, COLONOSCOPY; NEEDS TDAP, PNA, COVID & SHINGRIX  Additional Screening:  Vision Screening: Recommended annual ophthalmology exams for early detection of glaucoma and other disorders of the eye. Would you like a referral to an eye doctor? No    Dental Screening: Recommended annual dental exams for proper oral hygiene  Community Resource Referral / Chronic Care Management: CRR required this visit?  No   CCM required  this visit?  No   Plan:    I have personally reviewed and noted the following in the patient's chart:   Medical and social history Use of alcohol, tobacco or illicit drugs  Current medications and supplements including opioid prescriptions. Patient is not currently taking opioid prescriptions. Functional ability and status Nutritional status Physical activity Advanced directives List of other physicians Hospitalizations, surgeries, and ER visits in previous 12 months Vitals Screenings to include cognitive, depression, and falls Referrals and appointments  In addition, I have reviewed and discussed with patient certain preventive protocols, quality metrics, and best practice recommendations. A written personalized care plan for preventive services as  well as general preventive health recommendations were provided to patient.   Jhonnie GORMAN Das, LPN   1/71/7974   After Visit Summary: (MyChart) Due to this being a telephonic visit, the after visit summary with patients personalized plan was offered to patient via MyChart   Notes: Nothing significant to report at this time.

## 2023-10-10 ENCOUNTER — Telehealth: Payer: Self-pay

## 2023-10-10 DIAGNOSIS — Z9049 Acquired absence of other specified parts of digestive tract: Secondary | ICD-10-CM | POA: Diagnosis not present

## 2023-10-10 DIAGNOSIS — Z9181 History of falling: Secondary | ICD-10-CM | POA: Diagnosis not present

## 2023-10-10 DIAGNOSIS — N319 Neuromuscular dysfunction of bladder, unspecified: Secondary | ICD-10-CM | POA: Diagnosis not present

## 2023-10-10 DIAGNOSIS — S31109D Unspecified open wound of abdominal wall, unspecified quadrant without penetration into peritoneal cavity, subsequent encounter: Secondary | ICD-10-CM | POA: Diagnosis not present

## 2023-10-10 DIAGNOSIS — I890 Lymphedema, not elsewhere classified: Secondary | ICD-10-CM | POA: Diagnosis not present

## 2023-10-10 DIAGNOSIS — Z466 Encounter for fitting and adjustment of urinary device: Secondary | ICD-10-CM | POA: Diagnosis not present

## 2023-10-10 DIAGNOSIS — I5032 Chronic diastolic (congestive) heart failure: Secondary | ICD-10-CM | POA: Diagnosis not present

## 2023-10-10 DIAGNOSIS — K581 Irritable bowel syndrome with constipation: Secondary | ICD-10-CM | POA: Diagnosis not present

## 2023-10-10 DIAGNOSIS — G35 Multiple sclerosis: Secondary | ICD-10-CM | POA: Diagnosis not present

## 2023-10-10 DIAGNOSIS — N390 Urinary tract infection, site not specified: Secondary | ICD-10-CM | POA: Diagnosis not present

## 2023-10-10 DIAGNOSIS — M4802 Spinal stenosis, cervical region: Secondary | ICD-10-CM | POA: Diagnosis not present

## 2023-10-10 DIAGNOSIS — G894 Chronic pain syndrome: Secondary | ICD-10-CM | POA: Diagnosis not present

## 2023-10-10 DIAGNOSIS — K219 Gastro-esophageal reflux disease without esophagitis: Secondary | ICD-10-CM | POA: Diagnosis not present

## 2023-10-10 DIAGNOSIS — Z435 Encounter for attention to cystostomy: Secondary | ICD-10-CM | POA: Diagnosis not present

## 2023-10-10 DIAGNOSIS — Z853 Personal history of malignant neoplasm of breast: Secondary | ICD-10-CM | POA: Diagnosis not present

## 2023-10-10 DIAGNOSIS — G8220 Paraplegia, unspecified: Secondary | ICD-10-CM | POA: Diagnosis not present

## 2023-10-10 DIAGNOSIS — H900 Conductive hearing loss, bilateral: Secondary | ICD-10-CM | POA: Diagnosis not present

## 2023-10-10 DIAGNOSIS — K8689 Other specified diseases of pancreas: Secondary | ICD-10-CM | POA: Diagnosis not present

## 2023-10-10 DIAGNOSIS — I080 Rheumatic disorders of both mitral and aortic valves: Secondary | ICD-10-CM | POA: Diagnosis not present

## 2023-10-10 DIAGNOSIS — D649 Anemia, unspecified: Secondary | ICD-10-CM | POA: Diagnosis not present

## 2023-10-10 DIAGNOSIS — R1310 Dysphagia, unspecified: Secondary | ICD-10-CM | POA: Diagnosis not present

## 2023-10-10 NOTE — Telephone Encounter (Signed)
 Copied from CRM [id number]. Topic: General - Other >> Oct 10, 2023  2:47 PM Charlet HERO wrote: Reason for CRM: [facility] Harlene tn [phone] she is calling to see if she has been compliant with keeping her appts. She would like to know if the doctor has any concerns with her being able to care for herself please reach at the number above.

## 2023-10-11 ENCOUNTER — Ambulatory Visit: Admitting: Family Medicine

## 2023-10-12 ENCOUNTER — Other Ambulatory Visit: Payer: Self-pay | Admitting: Diagnostic Neuroimaging

## 2023-10-13 DIAGNOSIS — K8689 Other specified diseases of pancreas: Secondary | ICD-10-CM | POA: Diagnosis not present

## 2023-10-13 DIAGNOSIS — Z9049 Acquired absence of other specified parts of digestive tract: Secondary | ICD-10-CM | POA: Diagnosis not present

## 2023-10-13 DIAGNOSIS — R1310 Dysphagia, unspecified: Secondary | ICD-10-CM | POA: Diagnosis not present

## 2023-10-13 DIAGNOSIS — N390 Urinary tract infection, site not specified: Secondary | ICD-10-CM | POA: Diagnosis not present

## 2023-10-13 DIAGNOSIS — D649 Anemia, unspecified: Secondary | ICD-10-CM | POA: Diagnosis not present

## 2023-10-13 DIAGNOSIS — Z435 Encounter for attention to cystostomy: Secondary | ICD-10-CM | POA: Diagnosis not present

## 2023-10-13 DIAGNOSIS — K219 Gastro-esophageal reflux disease without esophagitis: Secondary | ICD-10-CM | POA: Diagnosis not present

## 2023-10-13 DIAGNOSIS — Z466 Encounter for fitting and adjustment of urinary device: Secondary | ICD-10-CM | POA: Diagnosis not present

## 2023-10-13 DIAGNOSIS — I5032 Chronic diastolic (congestive) heart failure: Secondary | ICD-10-CM | POA: Diagnosis not present

## 2023-10-13 DIAGNOSIS — Z9181 History of falling: Secondary | ICD-10-CM | POA: Diagnosis not present

## 2023-10-13 DIAGNOSIS — G8220 Paraplegia, unspecified: Secondary | ICD-10-CM | POA: Diagnosis not present

## 2023-10-13 DIAGNOSIS — G35 Multiple sclerosis: Secondary | ICD-10-CM | POA: Diagnosis not present

## 2023-10-13 DIAGNOSIS — G894 Chronic pain syndrome: Secondary | ICD-10-CM | POA: Diagnosis not present

## 2023-10-13 DIAGNOSIS — M4802 Spinal stenosis, cervical region: Secondary | ICD-10-CM | POA: Diagnosis not present

## 2023-10-13 DIAGNOSIS — I080 Rheumatic disorders of both mitral and aortic valves: Secondary | ICD-10-CM | POA: Diagnosis not present

## 2023-10-13 DIAGNOSIS — Z853 Personal history of malignant neoplasm of breast: Secondary | ICD-10-CM | POA: Diagnosis not present

## 2023-10-13 DIAGNOSIS — N319 Neuromuscular dysfunction of bladder, unspecified: Secondary | ICD-10-CM | POA: Diagnosis not present

## 2023-10-13 DIAGNOSIS — I890 Lymphedema, not elsewhere classified: Secondary | ICD-10-CM | POA: Diagnosis not present

## 2023-10-13 DIAGNOSIS — K581 Irritable bowel syndrome with constipation: Secondary | ICD-10-CM | POA: Diagnosis not present

## 2023-10-13 DIAGNOSIS — S31109D Unspecified open wound of abdominal wall, unspecified quadrant without penetration into peritoneal cavity, subsequent encounter: Secondary | ICD-10-CM | POA: Diagnosis not present

## 2023-10-13 DIAGNOSIS — H900 Conductive hearing loss, bilateral: Secondary | ICD-10-CM | POA: Diagnosis not present

## 2023-10-16 NOTE — Telephone Encounter (Signed)
 Returned call to Harlene to inform her. Harlene gave verbal understanding.

## 2023-10-17 ENCOUNTER — Other Ambulatory Visit: Payer: Self-pay | Admitting: Physician Assistant

## 2023-10-17 DIAGNOSIS — K581 Irritable bowel syndrome with constipation: Secondary | ICD-10-CM | POA: Diagnosis not present

## 2023-10-17 DIAGNOSIS — I5032 Chronic diastolic (congestive) heart failure: Secondary | ICD-10-CM | POA: Diagnosis not present

## 2023-10-17 DIAGNOSIS — N390 Urinary tract infection, site not specified: Secondary | ICD-10-CM | POA: Diagnosis not present

## 2023-10-17 DIAGNOSIS — Z9049 Acquired absence of other specified parts of digestive tract: Secondary | ICD-10-CM | POA: Diagnosis not present

## 2023-10-17 DIAGNOSIS — S31109D Unspecified open wound of abdominal wall, unspecified quadrant without penetration into peritoneal cavity, subsequent encounter: Secondary | ICD-10-CM | POA: Diagnosis not present

## 2023-10-17 DIAGNOSIS — Z466 Encounter for fitting and adjustment of urinary device: Secondary | ICD-10-CM | POA: Diagnosis not present

## 2023-10-17 DIAGNOSIS — I080 Rheumatic disorders of both mitral and aortic valves: Secondary | ICD-10-CM | POA: Diagnosis not present

## 2023-10-17 DIAGNOSIS — G8220 Paraplegia, unspecified: Secondary | ICD-10-CM | POA: Diagnosis not present

## 2023-10-17 DIAGNOSIS — N319 Neuromuscular dysfunction of bladder, unspecified: Secondary | ICD-10-CM | POA: Diagnosis not present

## 2023-10-17 DIAGNOSIS — K8689 Other specified diseases of pancreas: Secondary | ICD-10-CM | POA: Diagnosis not present

## 2023-10-17 DIAGNOSIS — H900 Conductive hearing loss, bilateral: Secondary | ICD-10-CM | POA: Diagnosis not present

## 2023-10-17 DIAGNOSIS — R1310 Dysphagia, unspecified: Secondary | ICD-10-CM | POA: Diagnosis not present

## 2023-10-17 DIAGNOSIS — M4802 Spinal stenosis, cervical region: Secondary | ICD-10-CM | POA: Diagnosis not present

## 2023-10-17 DIAGNOSIS — K219 Gastro-esophageal reflux disease without esophagitis: Secondary | ICD-10-CM | POA: Diagnosis not present

## 2023-10-17 DIAGNOSIS — Z435 Encounter for attention to cystostomy: Secondary | ICD-10-CM | POA: Diagnosis not present

## 2023-10-17 DIAGNOSIS — D649 Anemia, unspecified: Secondary | ICD-10-CM | POA: Diagnosis not present

## 2023-10-17 DIAGNOSIS — I890 Lymphedema, not elsewhere classified: Secondary | ICD-10-CM | POA: Diagnosis not present

## 2023-10-17 DIAGNOSIS — Z853 Personal history of malignant neoplasm of breast: Secondary | ICD-10-CM | POA: Diagnosis not present

## 2023-10-17 DIAGNOSIS — Z9181 History of falling: Secondary | ICD-10-CM | POA: Diagnosis not present

## 2023-10-17 DIAGNOSIS — G894 Chronic pain syndrome: Secondary | ICD-10-CM | POA: Diagnosis not present

## 2023-10-17 DIAGNOSIS — G35 Multiple sclerosis: Secondary | ICD-10-CM | POA: Diagnosis not present

## 2023-10-18 ENCOUNTER — Encounter: Attending: Physician Assistant | Admitting: Physician Assistant

## 2023-10-18 DIAGNOSIS — L98492 Non-pressure chronic ulcer of skin of other sites with fat layer exposed: Secondary | ICD-10-CM | POA: Insufficient documentation

## 2023-10-18 DIAGNOSIS — L89322 Pressure ulcer of left buttock, stage 2: Secondary | ICD-10-CM | POA: Diagnosis not present

## 2023-10-18 DIAGNOSIS — G4089 Other seizures: Secondary | ICD-10-CM | POA: Diagnosis not present

## 2023-10-18 DIAGNOSIS — G35 Multiple sclerosis: Secondary | ICD-10-CM | POA: Insufficient documentation

## 2023-10-18 DIAGNOSIS — L24A0 Irritant contact dermatitis due to friction or contact with body fluids, unspecified: Secondary | ICD-10-CM | POA: Diagnosis not present

## 2023-10-20 DIAGNOSIS — R1310 Dysphagia, unspecified: Secondary | ICD-10-CM | POA: Diagnosis not present

## 2023-10-20 DIAGNOSIS — Z853 Personal history of malignant neoplasm of breast: Secondary | ICD-10-CM | POA: Diagnosis not present

## 2023-10-20 DIAGNOSIS — S31109D Unspecified open wound of abdominal wall, unspecified quadrant without penetration into peritoneal cavity, subsequent encounter: Secondary | ICD-10-CM | POA: Diagnosis not present

## 2023-10-20 DIAGNOSIS — G8220 Paraplegia, unspecified: Secondary | ICD-10-CM | POA: Diagnosis not present

## 2023-10-20 DIAGNOSIS — Z466 Encounter for fitting and adjustment of urinary device: Secondary | ICD-10-CM | POA: Diagnosis not present

## 2023-10-20 DIAGNOSIS — M4802 Spinal stenosis, cervical region: Secondary | ICD-10-CM | POA: Diagnosis not present

## 2023-10-20 DIAGNOSIS — K8689 Other specified diseases of pancreas: Secondary | ICD-10-CM | POA: Diagnosis not present

## 2023-10-20 DIAGNOSIS — N390 Urinary tract infection, site not specified: Secondary | ICD-10-CM | POA: Diagnosis not present

## 2023-10-20 DIAGNOSIS — G35 Multiple sclerosis: Secondary | ICD-10-CM | POA: Diagnosis not present

## 2023-10-20 DIAGNOSIS — Z435 Encounter for attention to cystostomy: Secondary | ICD-10-CM | POA: Diagnosis not present

## 2023-10-20 DIAGNOSIS — I5032 Chronic diastolic (congestive) heart failure: Secondary | ICD-10-CM | POA: Diagnosis not present

## 2023-10-20 DIAGNOSIS — I080 Rheumatic disorders of both mitral and aortic valves: Secondary | ICD-10-CM | POA: Diagnosis not present

## 2023-10-20 DIAGNOSIS — N319 Neuromuscular dysfunction of bladder, unspecified: Secondary | ICD-10-CM | POA: Diagnosis not present

## 2023-10-20 DIAGNOSIS — Z9049 Acquired absence of other specified parts of digestive tract: Secondary | ICD-10-CM | POA: Diagnosis not present

## 2023-10-20 DIAGNOSIS — I890 Lymphedema, not elsewhere classified: Secondary | ICD-10-CM | POA: Diagnosis not present

## 2023-10-20 DIAGNOSIS — K581 Irritable bowel syndrome with constipation: Secondary | ICD-10-CM | POA: Diagnosis not present

## 2023-10-20 DIAGNOSIS — D649 Anemia, unspecified: Secondary | ICD-10-CM | POA: Diagnosis not present

## 2023-10-20 DIAGNOSIS — Z9181 History of falling: Secondary | ICD-10-CM | POA: Diagnosis not present

## 2023-10-20 DIAGNOSIS — G894 Chronic pain syndrome: Secondary | ICD-10-CM | POA: Diagnosis not present

## 2023-10-20 DIAGNOSIS — K219 Gastro-esophageal reflux disease without esophagitis: Secondary | ICD-10-CM | POA: Diagnosis not present

## 2023-10-20 DIAGNOSIS — H900 Conductive hearing loss, bilateral: Secondary | ICD-10-CM | POA: Diagnosis not present

## 2023-10-23 DIAGNOSIS — L97829 Non-pressure chronic ulcer of other part of left lower leg with unspecified severity: Secondary | ICD-10-CM | POA: Diagnosis not present

## 2023-10-24 DIAGNOSIS — N319 Neuromuscular dysfunction of bladder, unspecified: Secondary | ICD-10-CM | POA: Diagnosis not present

## 2023-10-24 DIAGNOSIS — Z435 Encounter for attention to cystostomy: Secondary | ICD-10-CM | POA: Diagnosis not present

## 2023-10-24 DIAGNOSIS — I080 Rheumatic disorders of both mitral and aortic valves: Secondary | ICD-10-CM | POA: Diagnosis not present

## 2023-10-24 DIAGNOSIS — R1310 Dysphagia, unspecified: Secondary | ICD-10-CM | POA: Diagnosis not present

## 2023-10-24 DIAGNOSIS — N390 Urinary tract infection, site not specified: Secondary | ICD-10-CM | POA: Diagnosis not present

## 2023-10-24 DIAGNOSIS — D649 Anemia, unspecified: Secondary | ICD-10-CM | POA: Diagnosis not present

## 2023-10-24 DIAGNOSIS — I5032 Chronic diastolic (congestive) heart failure: Secondary | ICD-10-CM | POA: Diagnosis not present

## 2023-10-24 DIAGNOSIS — G8220 Paraplegia, unspecified: Secondary | ICD-10-CM | POA: Diagnosis not present

## 2023-10-24 DIAGNOSIS — Z9049 Acquired absence of other specified parts of digestive tract: Secondary | ICD-10-CM | POA: Diagnosis not present

## 2023-10-24 DIAGNOSIS — Z853 Personal history of malignant neoplasm of breast: Secondary | ICD-10-CM | POA: Diagnosis not present

## 2023-10-24 DIAGNOSIS — M4802 Spinal stenosis, cervical region: Secondary | ICD-10-CM | POA: Diagnosis not present

## 2023-10-24 DIAGNOSIS — G894 Chronic pain syndrome: Secondary | ICD-10-CM | POA: Diagnosis not present

## 2023-10-24 DIAGNOSIS — K581 Irritable bowel syndrome with constipation: Secondary | ICD-10-CM | POA: Diagnosis not present

## 2023-10-24 DIAGNOSIS — I890 Lymphedema, not elsewhere classified: Secondary | ICD-10-CM | POA: Diagnosis not present

## 2023-10-24 DIAGNOSIS — G35 Multiple sclerosis: Secondary | ICD-10-CM | POA: Diagnosis not present

## 2023-10-24 DIAGNOSIS — Z9181 History of falling: Secondary | ICD-10-CM | POA: Diagnosis not present

## 2023-10-24 DIAGNOSIS — H900 Conductive hearing loss, bilateral: Secondary | ICD-10-CM | POA: Diagnosis not present

## 2023-10-24 DIAGNOSIS — K219 Gastro-esophageal reflux disease without esophagitis: Secondary | ICD-10-CM | POA: Diagnosis not present

## 2023-10-24 DIAGNOSIS — S31109D Unspecified open wound of abdominal wall, unspecified quadrant without penetration into peritoneal cavity, subsequent encounter: Secondary | ICD-10-CM | POA: Diagnosis not present

## 2023-10-24 DIAGNOSIS — K8689 Other specified diseases of pancreas: Secondary | ICD-10-CM | POA: Diagnosis not present

## 2023-10-24 DIAGNOSIS — Z466 Encounter for fitting and adjustment of urinary device: Secondary | ICD-10-CM | POA: Diagnosis not present

## 2023-10-27 ENCOUNTER — Ambulatory Visit: Admitting: Family Medicine

## 2023-10-27 DIAGNOSIS — I890 Lymphedema, not elsewhere classified: Secondary | ICD-10-CM | POA: Diagnosis not present

## 2023-10-27 DIAGNOSIS — K581 Irritable bowel syndrome with constipation: Secondary | ICD-10-CM | POA: Diagnosis not present

## 2023-10-27 DIAGNOSIS — Z853 Personal history of malignant neoplasm of breast: Secondary | ICD-10-CM | POA: Diagnosis not present

## 2023-10-27 DIAGNOSIS — K8689 Other specified diseases of pancreas: Secondary | ICD-10-CM | POA: Diagnosis not present

## 2023-10-27 DIAGNOSIS — I080 Rheumatic disorders of both mitral and aortic valves: Secondary | ICD-10-CM | POA: Diagnosis not present

## 2023-10-27 DIAGNOSIS — G894 Chronic pain syndrome: Secondary | ICD-10-CM | POA: Diagnosis not present

## 2023-10-27 DIAGNOSIS — I5032 Chronic diastolic (congestive) heart failure: Secondary | ICD-10-CM | POA: Diagnosis not present

## 2023-10-27 DIAGNOSIS — G35 Multiple sclerosis: Secondary | ICD-10-CM | POA: Diagnosis not present

## 2023-10-27 DIAGNOSIS — S31109D Unspecified open wound of abdominal wall, unspecified quadrant without penetration into peritoneal cavity, subsequent encounter: Secondary | ICD-10-CM | POA: Diagnosis not present

## 2023-10-27 DIAGNOSIS — D649 Anemia, unspecified: Secondary | ICD-10-CM | POA: Diagnosis not present

## 2023-10-27 DIAGNOSIS — R1310 Dysphagia, unspecified: Secondary | ICD-10-CM | POA: Diagnosis not present

## 2023-10-27 DIAGNOSIS — K219 Gastro-esophageal reflux disease without esophagitis: Secondary | ICD-10-CM | POA: Diagnosis not present

## 2023-10-27 DIAGNOSIS — M4802 Spinal stenosis, cervical region: Secondary | ICD-10-CM | POA: Diagnosis not present

## 2023-10-27 DIAGNOSIS — Z435 Encounter for attention to cystostomy: Secondary | ICD-10-CM | POA: Diagnosis not present

## 2023-10-27 DIAGNOSIS — H900 Conductive hearing loss, bilateral: Secondary | ICD-10-CM | POA: Diagnosis not present

## 2023-10-27 DIAGNOSIS — Z466 Encounter for fitting and adjustment of urinary device: Secondary | ICD-10-CM | POA: Diagnosis not present

## 2023-10-27 DIAGNOSIS — N319 Neuromuscular dysfunction of bladder, unspecified: Secondary | ICD-10-CM | POA: Diagnosis not present

## 2023-10-27 DIAGNOSIS — G8220 Paraplegia, unspecified: Secondary | ICD-10-CM | POA: Diagnosis not present

## 2023-10-27 DIAGNOSIS — Z9181 History of falling: Secondary | ICD-10-CM | POA: Diagnosis not present

## 2023-10-27 DIAGNOSIS — Z9049 Acquired absence of other specified parts of digestive tract: Secondary | ICD-10-CM | POA: Diagnosis not present

## 2023-10-27 DIAGNOSIS — N390 Urinary tract infection, site not specified: Secondary | ICD-10-CM | POA: Diagnosis not present

## 2023-10-30 DIAGNOSIS — H900 Conductive hearing loss, bilateral: Secondary | ICD-10-CM | POA: Diagnosis not present

## 2023-10-30 DIAGNOSIS — K581 Irritable bowel syndrome with constipation: Secondary | ICD-10-CM | POA: Diagnosis not present

## 2023-10-30 DIAGNOSIS — S31109D Unspecified open wound of abdominal wall, unspecified quadrant without penetration into peritoneal cavity, subsequent encounter: Secondary | ICD-10-CM | POA: Diagnosis not present

## 2023-10-30 DIAGNOSIS — K8689 Other specified diseases of pancreas: Secondary | ICD-10-CM | POA: Diagnosis not present

## 2023-10-30 DIAGNOSIS — I080 Rheumatic disorders of both mitral and aortic valves: Secondary | ICD-10-CM | POA: Diagnosis not present

## 2023-10-30 DIAGNOSIS — G894 Chronic pain syndrome: Secondary | ICD-10-CM | POA: Diagnosis not present

## 2023-10-30 DIAGNOSIS — I5032 Chronic diastolic (congestive) heart failure: Secondary | ICD-10-CM | POA: Diagnosis not present

## 2023-10-30 DIAGNOSIS — Z853 Personal history of malignant neoplasm of breast: Secondary | ICD-10-CM | POA: Diagnosis not present

## 2023-10-30 DIAGNOSIS — G35 Multiple sclerosis: Secondary | ICD-10-CM | POA: Diagnosis not present

## 2023-10-30 DIAGNOSIS — Z9049 Acquired absence of other specified parts of digestive tract: Secondary | ICD-10-CM | POA: Diagnosis not present

## 2023-10-30 DIAGNOSIS — R1310 Dysphagia, unspecified: Secondary | ICD-10-CM | POA: Diagnosis not present

## 2023-10-30 DIAGNOSIS — I890 Lymphedema, not elsewhere classified: Secondary | ICD-10-CM | POA: Diagnosis not present

## 2023-10-30 DIAGNOSIS — Z466 Encounter for fitting and adjustment of urinary device: Secondary | ICD-10-CM | POA: Diagnosis not present

## 2023-10-30 DIAGNOSIS — M4802 Spinal stenosis, cervical region: Secondary | ICD-10-CM | POA: Diagnosis not present

## 2023-10-30 DIAGNOSIS — N390 Urinary tract infection, site not specified: Secondary | ICD-10-CM | POA: Diagnosis not present

## 2023-10-30 DIAGNOSIS — K219 Gastro-esophageal reflux disease without esophagitis: Secondary | ICD-10-CM | POA: Diagnosis not present

## 2023-10-30 DIAGNOSIS — Z435 Encounter for attention to cystostomy: Secondary | ICD-10-CM | POA: Diagnosis not present

## 2023-10-30 DIAGNOSIS — G8220 Paraplegia, unspecified: Secondary | ICD-10-CM | POA: Diagnosis not present

## 2023-10-30 DIAGNOSIS — Z9181 History of falling: Secondary | ICD-10-CM | POA: Diagnosis not present

## 2023-10-30 DIAGNOSIS — N319 Neuromuscular dysfunction of bladder, unspecified: Secondary | ICD-10-CM | POA: Diagnosis not present

## 2023-10-30 DIAGNOSIS — D649 Anemia, unspecified: Secondary | ICD-10-CM | POA: Diagnosis not present

## 2023-10-31 ENCOUNTER — Ambulatory Visit: Admitting: Physician Assistant

## 2023-11-01 DIAGNOSIS — S3091XA Unspecified superficial injury of lower back and pelvis, initial encounter: Secondary | ICD-10-CM | POA: Diagnosis not present

## 2023-11-02 ENCOUNTER — Ambulatory Visit: Admitting: Physician Assistant

## 2023-11-05 ENCOUNTER — Other Ambulatory Visit: Payer: Self-pay | Admitting: Diagnostic Neuroimaging

## 2023-11-06 ENCOUNTER — Telehealth: Payer: Self-pay | Admitting: Diagnostic Neuroimaging

## 2023-11-06 DIAGNOSIS — G8220 Paraplegia, unspecified: Secondary | ICD-10-CM

## 2023-11-06 DIAGNOSIS — R030 Elevated blood-pressure reading, without diagnosis of hypertension: Secondary | ICD-10-CM

## 2023-11-06 DIAGNOSIS — Z760 Encounter for issue of repeat prescription: Secondary | ICD-10-CM

## 2023-11-06 MED ORDER — AMANTADINE HCL 100 MG PO CAPS: ORAL_CAPSULE | ORAL | 0 refills | Status: DC

## 2023-11-06 NOTE — Telephone Encounter (Signed)
 Patient request refill for amantadine  (SYMMETREL ) 100 MG capsule [id number]  Patient scheduled appointment on 01/15/24 at 2pm

## 2023-11-07 DIAGNOSIS — Z9181 History of falling: Secondary | ICD-10-CM | POA: Diagnosis not present

## 2023-11-07 DIAGNOSIS — Z9049 Acquired absence of other specified parts of digestive tract: Secondary | ICD-10-CM | POA: Diagnosis not present

## 2023-11-07 DIAGNOSIS — I080 Rheumatic disorders of both mitral and aortic valves: Secondary | ICD-10-CM | POA: Diagnosis not present

## 2023-11-07 DIAGNOSIS — R1310 Dysphagia, unspecified: Secondary | ICD-10-CM | POA: Diagnosis not present

## 2023-11-07 DIAGNOSIS — N390 Urinary tract infection, site not specified: Secondary | ICD-10-CM | POA: Diagnosis not present

## 2023-11-07 DIAGNOSIS — D649 Anemia, unspecified: Secondary | ICD-10-CM | POA: Diagnosis not present

## 2023-11-07 DIAGNOSIS — I890 Lymphedema, not elsewhere classified: Secondary | ICD-10-CM | POA: Diagnosis not present

## 2023-11-07 DIAGNOSIS — M4802 Spinal stenosis, cervical region: Secondary | ICD-10-CM | POA: Diagnosis not present

## 2023-11-07 DIAGNOSIS — Z435 Encounter for attention to cystostomy: Secondary | ICD-10-CM | POA: Diagnosis not present

## 2023-11-07 DIAGNOSIS — I5032 Chronic diastolic (congestive) heart failure: Secondary | ICD-10-CM | POA: Diagnosis not present

## 2023-11-07 DIAGNOSIS — G894 Chronic pain syndrome: Secondary | ICD-10-CM | POA: Diagnosis not present

## 2023-11-07 DIAGNOSIS — N319 Neuromuscular dysfunction of bladder, unspecified: Secondary | ICD-10-CM | POA: Diagnosis not present

## 2023-11-07 DIAGNOSIS — G8220 Paraplegia, unspecified: Secondary | ICD-10-CM | POA: Diagnosis not present

## 2023-11-07 DIAGNOSIS — Z853 Personal history of malignant neoplasm of breast: Secondary | ICD-10-CM | POA: Diagnosis not present

## 2023-11-07 DIAGNOSIS — G35 Multiple sclerosis: Secondary | ICD-10-CM | POA: Diagnosis not present

## 2023-11-07 DIAGNOSIS — H900 Conductive hearing loss, bilateral: Secondary | ICD-10-CM | POA: Diagnosis not present

## 2023-11-07 DIAGNOSIS — K8689 Other specified diseases of pancreas: Secondary | ICD-10-CM | POA: Diagnosis not present

## 2023-11-07 DIAGNOSIS — K219 Gastro-esophageal reflux disease without esophagitis: Secondary | ICD-10-CM | POA: Diagnosis not present

## 2023-11-07 DIAGNOSIS — K581 Irritable bowel syndrome with constipation: Secondary | ICD-10-CM | POA: Diagnosis not present

## 2023-11-07 DIAGNOSIS — Z466 Encounter for fitting and adjustment of urinary device: Secondary | ICD-10-CM | POA: Diagnosis not present

## 2023-11-07 DIAGNOSIS — S31109D Unspecified open wound of abdominal wall, unspecified quadrant without penetration into peritoneal cavity, subsequent encounter: Secondary | ICD-10-CM | POA: Diagnosis not present

## 2023-11-09 ENCOUNTER — Other Ambulatory Visit: Payer: Self-pay | Admitting: Diagnostic Neuroimaging

## 2023-11-09 ENCOUNTER — Ambulatory Visit: Admitting: Physician Assistant

## 2023-11-09 DIAGNOSIS — L89220 Pressure ulcer of left hip, unstageable: Secondary | ICD-10-CM | POA: Diagnosis not present

## 2023-11-09 MED ORDER — REBIF REBIDOSE 44 MCG/0.5ML ~~LOC~~ SOAJ: SUBCUTANEOUS | 0 refills | Status: DC

## 2023-11-09 NOTE — Telephone Encounter (Signed)
 Last filled by patient on 10/30/23 Last office visit : 12/28/22 Next office visit :01/15/24 patient must keep appoinmtnent for further refills

## 2023-11-10 DIAGNOSIS — N390 Urinary tract infection, site not specified: Secondary | ICD-10-CM | POA: Diagnosis not present

## 2023-11-10 DIAGNOSIS — Z466 Encounter for fitting and adjustment of urinary device: Secondary | ICD-10-CM | POA: Diagnosis not present

## 2023-11-10 DIAGNOSIS — I5032 Chronic diastolic (congestive) heart failure: Secondary | ICD-10-CM | POA: Diagnosis not present

## 2023-11-10 DIAGNOSIS — M4802 Spinal stenosis, cervical region: Secondary | ICD-10-CM | POA: Diagnosis not present

## 2023-11-10 DIAGNOSIS — K8689 Other specified diseases of pancreas: Secondary | ICD-10-CM | POA: Diagnosis not present

## 2023-11-10 DIAGNOSIS — K219 Gastro-esophageal reflux disease without esophagitis: Secondary | ICD-10-CM | POA: Diagnosis not present

## 2023-11-10 DIAGNOSIS — N319 Neuromuscular dysfunction of bladder, unspecified: Secondary | ICD-10-CM | POA: Diagnosis not present

## 2023-11-10 DIAGNOSIS — G8220 Paraplegia, unspecified: Secondary | ICD-10-CM | POA: Diagnosis not present

## 2023-11-10 DIAGNOSIS — Z9049 Acquired absence of other specified parts of digestive tract: Secondary | ICD-10-CM | POA: Diagnosis not present

## 2023-11-10 DIAGNOSIS — G35D Multiple sclerosis, unspecified: Secondary | ICD-10-CM | POA: Diagnosis not present

## 2023-11-10 DIAGNOSIS — Z853 Personal history of malignant neoplasm of breast: Secondary | ICD-10-CM | POA: Diagnosis not present

## 2023-11-10 DIAGNOSIS — Z9181 History of falling: Secondary | ICD-10-CM | POA: Diagnosis not present

## 2023-11-10 DIAGNOSIS — Z435 Encounter for attention to cystostomy: Secondary | ICD-10-CM | POA: Diagnosis not present

## 2023-11-10 DIAGNOSIS — S31109D Unspecified open wound of abdominal wall, unspecified quadrant without penetration into peritoneal cavity, subsequent encounter: Secondary | ICD-10-CM | POA: Diagnosis not present

## 2023-11-10 DIAGNOSIS — I080 Rheumatic disorders of both mitral and aortic valves: Secondary | ICD-10-CM | POA: Diagnosis not present

## 2023-11-10 DIAGNOSIS — H900 Conductive hearing loss, bilateral: Secondary | ICD-10-CM | POA: Diagnosis not present

## 2023-11-10 DIAGNOSIS — I890 Lymphedema, not elsewhere classified: Secondary | ICD-10-CM | POA: Diagnosis not present

## 2023-11-10 DIAGNOSIS — K581 Irritable bowel syndrome with constipation: Secondary | ICD-10-CM | POA: Diagnosis not present

## 2023-11-10 DIAGNOSIS — G894 Chronic pain syndrome: Secondary | ICD-10-CM | POA: Diagnosis not present

## 2023-11-10 DIAGNOSIS — D649 Anemia, unspecified: Secondary | ICD-10-CM | POA: Diagnosis not present

## 2023-11-10 DIAGNOSIS — R1310 Dysphagia, unspecified: Secondary | ICD-10-CM | POA: Diagnosis not present

## 2023-11-12 ENCOUNTER — Other Ambulatory Visit: Payer: Self-pay | Admitting: Urology

## 2023-11-12 DIAGNOSIS — N3289 Other specified disorders of bladder: Secondary | ICD-10-CM

## 2023-11-13 ENCOUNTER — Telehealth: Payer: Self-pay

## 2023-11-13 ENCOUNTER — Ambulatory Visit: Admitting: Family Medicine

## 2023-11-13 NOTE — Telephone Encounter (Signed)
 Copied from CRM [id number]. Topic: Clinical - Medical Advice >> Nov 13, 2023  8:51 AM Viola F wrote: Patient is no longer doing physical therapy from [facility]  due to her not having a lift to get in wheelchair and a medical bed, wants to know if provider can help with this. Please call her at [phone] (M)

## 2023-11-13 NOTE — Telephone Encounter (Signed)
She will need an appt to discuss

## 2023-11-15 ENCOUNTER — Encounter: Attending: Physician Assistant | Admitting: Physician Assistant

## 2023-11-15 DIAGNOSIS — G4089 Other seizures: Secondary | ICD-10-CM | POA: Diagnosis not present

## 2023-11-15 DIAGNOSIS — N390 Urinary tract infection, site not specified: Secondary | ICD-10-CM | POA: Diagnosis not present

## 2023-11-15 DIAGNOSIS — L98492 Non-pressure chronic ulcer of skin of other sites with fat layer exposed: Secondary | ICD-10-CM | POA: Diagnosis not present

## 2023-11-15 DIAGNOSIS — L24A0 Irritant contact dermatitis due to friction or contact with body fluids, unspecified: Secondary | ICD-10-CM | POA: Insufficient documentation

## 2023-11-15 DIAGNOSIS — I890 Lymphedema, not elsewhere classified: Secondary | ICD-10-CM | POA: Diagnosis not present

## 2023-11-15 DIAGNOSIS — Z9181 History of falling: Secondary | ICD-10-CM | POA: Diagnosis not present

## 2023-11-15 DIAGNOSIS — M4802 Spinal stenosis, cervical region: Secondary | ICD-10-CM | POA: Diagnosis not present

## 2023-11-15 DIAGNOSIS — H900 Conductive hearing loss, bilateral: Secondary | ICD-10-CM | POA: Diagnosis not present

## 2023-11-15 DIAGNOSIS — I080 Rheumatic disorders of both mitral and aortic valves: Secondary | ICD-10-CM | POA: Diagnosis not present

## 2023-11-15 DIAGNOSIS — Z9049 Acquired absence of other specified parts of digestive tract: Secondary | ICD-10-CM | POA: Diagnosis not present

## 2023-11-15 DIAGNOSIS — Z853 Personal history of malignant neoplasm of breast: Secondary | ICD-10-CM | POA: Diagnosis not present

## 2023-11-15 DIAGNOSIS — G35D Multiple sclerosis, unspecified: Secondary | ICD-10-CM | POA: Insufficient documentation

## 2023-11-15 DIAGNOSIS — S31109D Unspecified open wound of abdominal wall, unspecified quadrant without penetration into peritoneal cavity, subsequent encounter: Secondary | ICD-10-CM | POA: Diagnosis not present

## 2023-11-15 DIAGNOSIS — R1310 Dysphagia, unspecified: Secondary | ICD-10-CM | POA: Diagnosis not present

## 2023-11-15 DIAGNOSIS — D649 Anemia, unspecified: Secondary | ICD-10-CM | POA: Diagnosis not present

## 2023-11-15 DIAGNOSIS — Z466 Encounter for fitting and adjustment of urinary device: Secondary | ICD-10-CM | POA: Diagnosis not present

## 2023-11-15 DIAGNOSIS — K581 Irritable bowel syndrome with constipation: Secondary | ICD-10-CM | POA: Diagnosis not present

## 2023-11-15 DIAGNOSIS — G894 Chronic pain syndrome: Secondary | ICD-10-CM | POA: Diagnosis not present

## 2023-11-15 DIAGNOSIS — G8220 Paraplegia, unspecified: Secondary | ICD-10-CM | POA: Diagnosis not present

## 2023-11-15 DIAGNOSIS — K8689 Other specified diseases of pancreas: Secondary | ICD-10-CM | POA: Diagnosis not present

## 2023-11-15 DIAGNOSIS — N319 Neuromuscular dysfunction of bladder, unspecified: Secondary | ICD-10-CM | POA: Diagnosis not present

## 2023-11-15 DIAGNOSIS — K219 Gastro-esophageal reflux disease without esophagitis: Secondary | ICD-10-CM | POA: Diagnosis not present

## 2023-11-15 DIAGNOSIS — Z435 Encounter for attention to cystostomy: Secondary | ICD-10-CM | POA: Diagnosis not present

## 2023-11-15 DIAGNOSIS — I5032 Chronic diastolic (congestive) heart failure: Secondary | ICD-10-CM | POA: Diagnosis not present

## 2023-11-21 DIAGNOSIS — N319 Neuromuscular dysfunction of bladder, unspecified: Secondary | ICD-10-CM | POA: Diagnosis not present

## 2023-11-21 DIAGNOSIS — G35D Multiple sclerosis, unspecified: Secondary | ICD-10-CM | POA: Diagnosis not present

## 2023-11-21 DIAGNOSIS — Z435 Encounter for attention to cystostomy: Secondary | ICD-10-CM | POA: Diagnosis not present

## 2023-11-21 DIAGNOSIS — Z853 Personal history of malignant neoplasm of breast: Secondary | ICD-10-CM | POA: Diagnosis not present

## 2023-11-21 DIAGNOSIS — K581 Irritable bowel syndrome with constipation: Secondary | ICD-10-CM | POA: Diagnosis not present

## 2023-11-21 DIAGNOSIS — Z466 Encounter for fitting and adjustment of urinary device: Secondary | ICD-10-CM | POA: Diagnosis not present

## 2023-11-21 DIAGNOSIS — I5032 Chronic diastolic (congestive) heart failure: Secondary | ICD-10-CM | POA: Diagnosis not present

## 2023-11-21 DIAGNOSIS — Z9181 History of falling: Secondary | ICD-10-CM | POA: Diagnosis not present

## 2023-11-21 DIAGNOSIS — M4802 Spinal stenosis, cervical region: Secondary | ICD-10-CM | POA: Diagnosis not present

## 2023-11-21 DIAGNOSIS — D649 Anemia, unspecified: Secondary | ICD-10-CM | POA: Diagnosis not present

## 2023-11-21 DIAGNOSIS — H900 Conductive hearing loss, bilateral: Secondary | ICD-10-CM | POA: Diagnosis not present

## 2023-11-21 DIAGNOSIS — Z9049 Acquired absence of other specified parts of digestive tract: Secondary | ICD-10-CM | POA: Diagnosis not present

## 2023-11-21 DIAGNOSIS — G8220 Paraplegia, unspecified: Secondary | ICD-10-CM | POA: Diagnosis not present

## 2023-11-21 DIAGNOSIS — K8689 Other specified diseases of pancreas: Secondary | ICD-10-CM | POA: Diagnosis not present

## 2023-11-21 DIAGNOSIS — R1310 Dysphagia, unspecified: Secondary | ICD-10-CM | POA: Diagnosis not present

## 2023-11-21 DIAGNOSIS — K219 Gastro-esophageal reflux disease without esophagitis: Secondary | ICD-10-CM | POA: Diagnosis not present

## 2023-11-21 DIAGNOSIS — I890 Lymphedema, not elsewhere classified: Secondary | ICD-10-CM | POA: Diagnosis not present

## 2023-11-21 DIAGNOSIS — G894 Chronic pain syndrome: Secondary | ICD-10-CM | POA: Diagnosis not present

## 2023-11-21 DIAGNOSIS — N390 Urinary tract infection, site not specified: Secondary | ICD-10-CM | POA: Diagnosis not present

## 2023-11-21 DIAGNOSIS — I080 Rheumatic disorders of both mitral and aortic valves: Secondary | ICD-10-CM | POA: Diagnosis not present

## 2023-11-21 DIAGNOSIS — S31109D Unspecified open wound of abdominal wall, unspecified quadrant without penetration into peritoneal cavity, subsequent encounter: Secondary | ICD-10-CM | POA: Diagnosis not present

## 2023-11-22 ENCOUNTER — Encounter: Payer: Self-pay | Admitting: Nurse Practitioner

## 2023-11-22 ENCOUNTER — Ambulatory Visit (INDEPENDENT_AMBULATORY_CARE_PROVIDER_SITE_OTHER): Admitting: Nurse Practitioner

## 2023-11-22 VITALS — BP 130/82 | HR 80 | Temp 98.0°F | Resp 18

## 2023-11-22 DIAGNOSIS — G35D Multiple sclerosis, unspecified: Secondary | ICD-10-CM | POA: Diagnosis not present

## 2023-11-22 DIAGNOSIS — G8220 Paraplegia, unspecified: Secondary | ICD-10-CM

## 2023-11-22 NOTE — Progress Notes (Addendum)
 BP 130/82   Pulse 80   Temp 98 F (36.7 C)   Resp 18   SpO2 94%    Subjective:    Patient ID: Rebecca [name], female    DOB: [birth year], 63 y.o.   MRN: [id number]  HPI: Rebecca [name] is a 63 y.o. female  Chief Complaint  Patient presents with   Orders    DME for hospital bed and lift to stand chair   Discussed the use of AI scribe software for clinical note transcription with the patient, who gave verbal consent to proceed.  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 in mobility and daily activities - Requires assistance with transfers and ambulation - Requests durable medical equipment including a hospital bed and a lift to stand chair to aid in mobility  Physical therapy needs - Not currently receiving physical therapy - Desires resumption of physical therapy services, ideally more than once per week, to improve mobility - Identifies lack of physical therapy as a significant concern impacting her ability to move  Urinary catheterization and wound care - Foley catheter in place for urinary management - Receiving wound care         10/05/2023    8:15 AM 02/20/2023   11:09 AM 12/07/2022   10:12 AM  Depression screen PHQ 2/9  Decreased Interest 0 0 0  Down, Depressed, Hopeless 0 0 0  PHQ - 2 Score 0 0 0  Altered sleeping 0 0 0  Tired, decreased energy 0 0 0  Change in appetite 0 0 0  Feeling bad or failure about yourself  0 0 0  Trouble concentrating 0 0 0  Moving slowly or fidgety/restless 0 0 0  Suicidal thoughts 0 0 0  PHQ-9 Score 0 0 0  Difficult doing work/chores Not difficult at all Not difficult at all Not difficult at all    Relevant past medical, surgical, family and social  history reviewed and updated as indicated. Interim medical history since our last visit reviewed. Allergies and medications reviewed and updated.  Review of Systems  Ten systems reviewed and is negative except as mentioned in HPI      Objective:      BP 130/82   Pulse 80   Temp 98 F (36.7 C)   Resp 18   SpO2 94%    Wt Readings from Last 3 Encounters:  08/01/23 205 lb (93 kg)  07/11/23 205 lb (93 kg)  06/26/23 205 lb 7.5 oz (93.2 kg)    Physical Exam GENERAL: Alert, cooperative, well developed, no acute distress HEENT: Normocephalic, normal oropharynx, moist mucous membranes CHEST: Clear to auscultation bilaterally, No wheezes, rhonchi, or crackles CARDIOVASCULAR: Normal heart rate and rhythm, S1 and S2 normal without murmurs NEUROLOGICAL: Cranial nerves grossly intact, patient is bedbound on ems stretcher  Results for orders placed or performed in visit on 08/17/23  Cancer antigen 27.29   Collection Time: 08/17/23  9:21 AM  Result Value Ref Range   CA 27.29 16.9 0.0 - 38.6 U/mL  CBC with Differential/Platelet   Collection Time: 08/17/23  9:21 AM  Result Value Ref Range   WBC 4.7 4.0 - 10.5 K/uL   RBC 4.01 3.87 - 5.11 MIL/uL   Hemoglobin 11.5 (  L) 12.0 - 15.0 g/dL   HCT 64.7 (L) 63.9 - 53.9 %   MCV 87.8 80.0 - 100.0 fL   MCH 28.7 26.0 - 34.0 pg   MCHC 32.7 30.0 - 36.0 g/dL   RDW 86.0 88.4 - 84.4 %   Platelets 196 150 - 400 K/uL   nRBC 0.0 0.0 - 0.2 %   Neutrophils Relative % 50 %   Neutro Abs 2.3 1.7 - 7.7 K/uL   Lymphocytes Relative 37 %   Lymphs Abs 1.7 0.7 - 4.0 K/uL   Monocytes Relative 8 %   Monocytes Absolute 0.4 0.1 - 1.0 K/uL   Eosinophils Relative 5 %   Eosinophils Absolute 0.2 0.0 - 0.5 K/uL   Basophils Relative 0 %   Basophils Absolute 0.0 0.0 - 0.1 K/uL   Immature Granulocytes 0 %   Abs Immature Granulocytes 0.01 0.00 - 0.07 K/uL   *Note: Due to a large number of results and/or encounters for the requested time period, some results have not been  displayed. A complete set of results can be found in Results Review.          Assessment & Plan:   Problem List Items Addressed This Visit       Nervous and Auditory   MS (multiple sclerosis) - Primary   Relevant Orders   For home use only DME Hospital bed   For home use only DME Other see comment   Paraplegia (HCC)   Relevant Orders   For home use only DME Hospital bed   For home use only DME Other see comment     Assessment and Plan Assessment & Plan Multiple sclerosis with spastic paraplegia Experiencing mobility challenges requiring durable medical equipment. - Order hospital bed and sit to stand lift  Need for physical therapy Expresses need for physical therapy to improve mobility and desires more frequent sessions. - Arrange for physical therapy services. - Request increased frequency of physical therapy sessions, ideally more than once a week.        Follow up plan: Return if symptoms worsen or fail to improve.

## 2023-11-28 ENCOUNTER — Other Ambulatory Visit: Payer: Self-pay

## 2023-11-28 ENCOUNTER — Telehealth: Payer: Self-pay | Admitting: Diagnostic Neuroimaging

## 2023-11-28 MED ORDER — REBIF REBIDOSE 44 MCG/0.5ML ~~LOC~~ SOAJ: SUBCUTANEOUS | 1 refills | Status: DC

## 2023-11-28 NOTE — Telephone Encounter (Signed)
 Next visit 01/15/24. Per last OV 08/2022, continue Rebif .  Last fills:   [facility]

## 2023-11-28 NOTE — Telephone Encounter (Signed)
 Pt is requesting a refill for REBIF REBIDOSE 44 MCG/0.5ML SOAJ.  Pharmacy: [facility]

## 2023-11-30 ENCOUNTER — Ambulatory Visit: Admitting: Physician Assistant

## 2023-12-05 ENCOUNTER — Encounter: Admitting: Physician Assistant

## 2023-12-05 DIAGNOSIS — L24A0 Irritant contact dermatitis due to friction or contact with body fluids, unspecified: Secondary | ICD-10-CM | POA: Diagnosis not present

## 2023-12-12 ENCOUNTER — Other Ambulatory Visit: Payer: Self-pay | Admitting: Urology

## 2023-12-12 DIAGNOSIS — R8290 Unspecified abnormal findings in urine: Secondary | ICD-10-CM

## 2023-12-14 ENCOUNTER — Ambulatory Visit: Admitting: Family Medicine

## 2023-12-19 ENCOUNTER — Encounter: Admitting: Physician Assistant

## 2023-12-20 NOTE — Progress Notes (Signed)
 Rebecca [name]                                          MRN: [id number]   12/20/2023   The [facility] Quality Team Specialist reviewed this patient medical record for the purposes of chart review for care gap closure. The following were reviewed: chart review for care gap closure-glycemic status assessment.    VBCI Quality Team

## 2023-12-20 NOTE — Telephone Encounter (Signed)
 Received confirmation  [facility]  8mcg/0.5ml pen shipped 12-19-2023

## 2023-12-26 ENCOUNTER — Encounter: Admitting: Physician Assistant

## 2023-12-26 ENCOUNTER — Other Ambulatory Visit: Payer: Self-pay | Admitting: Diagnostic Neuroimaging

## 2023-12-27 ENCOUNTER — Encounter: Payer: Self-pay | Admitting: Nurse Practitioner

## 2023-12-27 DIAGNOSIS — G35D Multiple sclerosis, unspecified: Secondary | ICD-10-CM

## 2023-12-27 DIAGNOSIS — G8220 Paraplegia, unspecified: Secondary | ICD-10-CM

## 2024-01-01 ENCOUNTER — Other Ambulatory Visit: Payer: Self-pay | Admitting: Physician Assistant

## 2024-01-03 ENCOUNTER — Encounter: Attending: Physician Assistant | Admitting: Physician Assistant

## 2024-01-03 ENCOUNTER — Telehealth: Payer: Self-pay

## 2024-01-03 NOTE — Telephone Encounter (Signed)
 Per pt she is out of crecon. Pt is unable to return to [place] to see NP Ellouise Console.. Pt was told to call [facility] to get an appointment in order to get refill on Crecon. PT states she will be out of medication until 02-23-2024

## 2024-01-15 ENCOUNTER — Encounter: Payer: Self-pay | Admitting: Diagnostic Neuroimaging

## 2024-01-15 ENCOUNTER — Telehealth: Admitting: Diagnostic Neuroimaging

## 2024-01-15 DIAGNOSIS — R030 Elevated blood-pressure reading, without diagnosis of hypertension: Secondary | ICD-10-CM

## 2024-01-15 DIAGNOSIS — G35D Multiple sclerosis, unspecified: Secondary | ICD-10-CM

## 2024-01-15 DIAGNOSIS — G40909 Epilepsy, unspecified, not intractable, without status epilepticus: Secondary | ICD-10-CM

## 2024-01-15 DIAGNOSIS — Z760 Encounter for issue of repeat prescription: Secondary | ICD-10-CM

## 2024-01-15 MED ORDER — TIZANIDINE HCL 4 MG PO TABS: 4.0000 mg | ORAL_TABLET | Freq: Every day | ORAL | 4 refills | Status: AC

## 2024-01-15 MED ORDER — AMANTADINE HCL 100 MG PO CAPS: 100.0000 mg | ORAL_CAPSULE | Freq: Three times a day (TID) | ORAL | 4 refills | Status: AC

## 2024-01-15 MED ORDER — LEVETIRACETAM 500 MG PO TABS: 500.0000 mg | ORAL_TABLET | Freq: Two times a day (BID) | ORAL | 4 refills | Status: AC

## 2024-01-15 MED ORDER — BACLOFEN 10 MG PO TABS: 10.0000 mg | ORAL_TABLET | Freq: Three times a day (TID) | ORAL | 4 refills | Status: AC

## 2024-01-15 NOTE — Progress Notes (Signed)
 Chief Complaint  Patient presents with   Multiple Sclerosis     History of Present Illness:  UPDATE (01/15/24, VRP): Since last visit, doing about the same. Tolerating rebif  and other meds. Working with [facility] for some home health equipment.   UPDATE (08/15/22, VRP): Since last visit, doing about the same. Symptoms are stable. Severity is moderate. No alleviating or aggravating factors. Tolerating meds.    UPDATE (01/04/21, VRP): patient now at rehab facility. Fell at home, and broke her left tibia and fibula (Dec 08, 2020). MS has been stable.   UPDATE (12/16/19 , VRP): Since last visit, doing well; now s/p yeast UTI, on diflucan , and pain in feet reduced. Has seen Dr. Tobie. Has home health nurse; urine still has some odor and patient needs follow up with urology. Tolerating rebif .   PRIOR HPI: - tolerating rebif ; had Moderna covid vaccine (May and June 2021); overall doing well at home. - continuing with gait issues - tolerating muscle relaxers and amantadine     Observations/Objective:  Video visit   Assessment and Plan:   63 y.o. 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 Last dose tysabri 10/29/10. Switched to gilenya on 04/25/11.    Had brief seizure before starting gilenya, now on levetiracetam .    Then with right V2, V3 herpes zoster on 05/27/11. Gilenya stopped. Shingles resolved.  Then on rebif . Also s/p ACDF C5-6 (Oct 2013) for spinal stenosis.    Then with breast CA diagnosis, s/p right mastectomy, s/p adriamycin, cytoxan , taxol . Now treatments have been completed.    Now back on rebif  (since Jan 2017).   Now with new onset headaches (June 2017). MRI brain unremarkable. HA now improving.    Dx:  1. MS (multiple sclerosis)   2. Seizure disorder (HCC)   3. Medication refill   4. MS (multiple sclerosis)   5. Multiple sclerosis   6. Elevated BP without diagnosis of  hypertension   7. Seizure disorder (HCC)     MULTIPLE SCLEROSIS (relapsing remitting, secondary progression) - continue rebif  - continue home health PT / nursing   URINARY INCONTINENCE / RECURRENT UTI - continue oxybutynin  - follow up with urology   MUSCLE SPASMS - continue baclofen  and tizanidine  for muscle spasms   MS fatigue - continue amantadine  for fatigue (stable)   SEIZURE DISORDER - continue levetiracetam  for seizure d/o (stable)  Meds ordered this encounter  Medications   baclofen  (LIORESAL ) 10 MG tablet    Sig: Take 1-2 tablets (10-20 mg total) by mouth 3 (three) times daily.    Dispense:  450 tablet    Refill:  4   tiZANidine  (ZANAFLEX ) 4 MG tablet    Sig: Take 1 tablet (4 mg total) by mouth at bedtime.    Dispense:  90 tablet    Refill:  4   amantadine  (SYMMETREL ) 100 MG capsule    Sig: Take 1 capsule (100 mg total) by mouth 3 (three) times daily. TAKE 1 CAPSULE BY MOUTH THREE TIMES A DAY    Dispense:  270 capsule    Refill:  4    Patient must keep future appt to continue refills.   levETIRAcetam  (KEPPRA ) 500 MG tablet    Sig: Take 1 tablet (500 mg total) by mouth 2 (two) times daily.    Dispense:  180 tablet    Refill:  4   Follow Up Instructions:  - Return in  about 1 year (around 01/14/2025).     Virtual Visit via Video Note  I connected with FALYN RUBEL on 01/15/24 at  2:00 PM EST by a video enabled telemedicine application and verified that I am speaking with the correct person using two identifiers.   I discussed the limitations of evaluation and management by telemedicine and the availability of in person appointments. The patient expressed understanding and agreed to proceed.  Patient is at home and I am at the office.   I spent 20 minutes of face-to-face and non-face-to-face time with patient.  This included previsit chart review, lab review, study review, order entry, electronic health record documentation, patient education.       EDUARD FABIENE HANLON, MD 01/15/2024, 2:55 PM Certified in Neurology, Neurophysiology and Neuroimaging  [facility] [address], [address] [place], [place] [address] [phone]

## 2024-01-18 ENCOUNTER — Encounter: Admitting: Internal Medicine

## 2024-01-25 ENCOUNTER — Other Ambulatory Visit: Payer: Self-pay | Admitting: Diagnostic Neuroimaging

## 2024-01-25 ENCOUNTER — Encounter: Admitting: Internal Medicine

## 2024-01-25 MED ORDER — REBIF REBIDOSE 44 MCG/0.5ML ~~LOC~~ SOAJ: SUBCUTANEOUS | 2 refills | Status: AC

## 2024-01-25 NOTE — Telephone Encounter (Signed)
 Pt is requesting a refill for REBIF REBIDOSE 44 MCG/0.5ML SOAJ.  Pharmacy: [facility]

## 2024-01-25 NOTE — Telephone Encounter (Signed)
 Message sent to provider

## 2024-01-25 NOTE — Telephone Encounter (Signed)
 Last refilled by patient on : 12/19/23 Last office visit : 01/15/24 Next office visit : Return in about 1 year (around 01/14/2025) Per lab results - continue rebif 

## 2024-01-26 ENCOUNTER — Encounter: Attending: Internal Medicine | Admitting: Internal Medicine

## 2024-01-26 DIAGNOSIS — L98492 Non-pressure chronic ulcer of skin of other sites with fat layer exposed: Secondary | ICD-10-CM | POA: Diagnosis not present

## 2024-01-26 DIAGNOSIS — L24A0 Irritant contact dermatitis due to friction or contact with body fluids, unspecified: Secondary | ICD-10-CM | POA: Insufficient documentation

## 2024-01-26 DIAGNOSIS — G4089 Other seizures: Secondary | ICD-10-CM | POA: Insufficient documentation

## 2024-01-26 DIAGNOSIS — G35D Multiple sclerosis, unspecified: Secondary | ICD-10-CM | POA: Diagnosis not present

## 2024-02-22 ENCOUNTER — Encounter: Admitting: Physician Assistant

## 2024-02-22 NOTE — Progress Notes (Signed)
 "   02/23/2024 [name] [id number] 24-Nov-1960  Gastroenterology Office Note     Primary Care Physician:  Gareth Mliss FALCON, FNP  Primary GI Provider: Celestia Rima, NP; Jinny Carmine, MD    Chief Complaint   Chief Complaint  Patient presents with   New Patient (Initial Visit)    HX IBS -dysphagia and colon screen- takes prilosec  doing well- also takes creon  but she is currently out- BM's are normal     History of Present Illness   Rebecca [name] is a 64 y.o. female with PMHX of MS presenting today for annual follow up and refills.   Discussed the use of AI scribe software for clinical note transcription with the patient, who gave verbal consent to proceed.  Patient here today on a stretcher. Exocrine pancreatic insufficiency was diagnosed several years ago and has been managed with Creon , two capsules with meals and one with snacks, which she finds effective. She ran out of Creon  in December 2025 after receiving four sample bottles. Since being out of Creon , bowel movements have remained stable without increased diarrhea, major constipation, abdominal pain, or changes in stool consistency. Her home health aide noted a slight increase in mucous but no blood or melena.  Gastroesophageal reflux disease is managed with prescription omeprazole , taken once daily in the morning 30-40 minutes before eating. The prescription formulation is more effective than the over-the-counter version. She has three or four pills remaining and requires a refill. No breakthrough reflux, heartburn, nighttime symptoms, or symptoms waking her from sleep. No abdominal pain is present.  Patient is overdue for colonoscopy but physically unable to do bowel prep. She had cologuard home kit but it expired.   Patient seen by Ellouise Console, PA on 08/25/2022 for follow up on pancreatic insufficiency.  Has been on Creon  36,000 lipase units with benefit.  She takes 2 with each meal and 1 with each snack.  Does control  her diarrhea.     Past Medical History:  Diagnosis Date   Allergy    Aortic valve disease    Mild AS / AI - most recent Echo demonstrated tricuspid aortic valve.   Bacterial endocarditis    History of .   Bilateral impacted cerumen 06/29/2021   Bilateral lower extremity edema    Noncardiac.  Chronic. LE Venous dopplers - negative for DVT.; Echocardiogram January 2016: Normal EF with normal wall motion and valve function. Only grade 1 diastolic dysfunction. EF 60-65%. Mild MR   Breast cancer (HCC) 12/31/2013   Right breast, 12:00, 1.5 cm, T1c,N0 invasive mammary carcinoma, triple negative. --> Rx with Chemo   Cervical stenosis of spine    GERD (gastroesophageal reflux disease) April or May 2022   Heart murmur 2013   Herpes zoster    IBS (irritable bowel syndrome)    Lymphedema    has legs wrapped at [facility]   Multiple sclerosis 2001   Walks from room to room @ home; but Wheelchair when going out.   Neuromuscular disorder (HCC)    MS   PONV (postoperative nausea and vomiting)    Related to Fentanyl    Seizures (HCC)    Takes Keppra    SOB (shortness of breath) 03/01/2013   Syncope and collapse    Urinary tract infection associated with indwelling urethral catheter 02/17/2020    Past Surgical History:  Procedure Laterality Date   ANKLE SURGERY     Left   ANKLE SURGERY     ANTERIOR CERVICAL DECOMP/DISCECTOMY FUSION  11/17/2011   Procedure: ANTERIOR CERVICAL DECOMPRESSION/DISCECTOMY FUSION 2 LEVELS;  Surgeon: Fairy Levels, MD;  Location: [facility];  Service: Neurosurgery;  Laterality: N/A;  Cervical Five-Six Six-Seven Anterior cervical decompression/diskectomy/fusion   BREAST BIOPSY Right 12/31/2013   invasive mammary   BREAST SURGERY Right 02/03/2014   Right simple mastectomy with sentinel node biopsy.   CHOLECYSTECTOMY     COLONOSCOPY  2014   ESOPHAGOGASTRODUODENOSCOPY (EGD) WITH PROPOFOL  N/A 07/16/2020   Procedure: ESOPHAGOGASTRODUODENOSCOPY (EGD) WITH PROPOFOL ;   Surgeon: Janalyn Keene NOVAK, MD;  Location: [facility];  Service: Endoscopy;  Laterality: N/A;  Patient has MS and will need assistance   HYSTEROSCOPY WITH D & C N/A 11/27/2018   Procedure: DILATATION AND CURETTAGE /HYSTEROSCOPY;  Surgeon: Arloa Lamar SQUIBB, MD;  Location: [facility] ORS;  Service: Gynecology;  Laterality: N/A;   IR CATHETER TUBE CHANGE  05/05/2023   IR CYSTOSTOMY TUBE CHANGE COMPLICATED W IMG  06/09/2023   IR CYSTOSTOMY TUBE PLACEMENT/BLADDER ASPIRATION  03/23/2023   Lower extremity venous Dopplers  02/27/2013   No LE DVT   MASTECTOMY Right 2015   ORIF TIBIA PLATEAU Left 12/09/2020   Procedure: LEFT OPEN REDUCTION INTERNAL FIXATION (ORIF) TIBIAL PLATEAU;  Surgeon: Kendal Franky SQUIBB, MD;  Location: [facility] OR;  Service: Orthopedics;  Laterality: Left;   Port a cath insertion Right 01/19/2010   PORT-A-CATH REMOVAL     right   PORT-A-CATH REMOVAL Right 09/03/2013   Procedure: REMOVAL PORT-A-CATH;  Surgeon: Redell LITTIE Door, MD;  Location: [facility];  Service: Vascular;  Laterality: Right;   SPINE SURGERY  Cervical steinois Dr. Levels 2014 I think?   TONSILLECTOMY     TRANSTHORACIC ECHOCARDIOGRAM  03/2013; 02/2014   a) Normal LV size and function with EF 60-65%.; Cannot exclude bicuspid aortic valve with mild AS and mild AI.; b) Normal EF with normal wall motion and valve function x Mild MR. G2 DD. EF 60-65%. Tricuspid AoV   UPPER GI ENDOSCOPY  2014    Current Outpatient Medications  Medication Sig Dispense Refill   amantadine  (SYMMETREL ) 100 MG capsule Take 1 capsule (100 mg total) by mouth 3 (three) times daily. TAKE 1 CAPSULE BY MOUTH THREE TIMES A DAY 270 capsule 4   baclofen  (LIORESAL ) 10 MG tablet Take 1-2 tablets (10-20 mg total) by mouth 3 (three) times daily. 450 tablet 4   gluconic acid-citric acid (RENACIDIN ) irrigation INSERT 30 ML INTO SUPRAPUBIC TUBE AND CLAMP TUBE FOR 10 MINUTES AND THEN DRAIN TWICE DAILY 900 mL 3   Glucosamine-Chondroitin (COSAMIN DS PO) Take 1 tablet by mouth 3  (three) times daily.     levETIRAcetam  (KEPPRA ) 500 MG tablet Take 1 tablet (500 mg total) by mouth 2 (two) times daily. 180 tablet 4   lipase/protease/amylase (CREON ) 36000 UNITS CPEP capsule TAKE 2 CAPSULES BY MOUTH 3 TIMES DAILY WITH MEALS AND 1 CAPSULE WITH SNACKS. 300 capsule 0   loratadine  (CLARITIN ) 10 MG tablet Take 10 mg by mouth daily.     Misc Natural Products (LEG VEIN & CIRCULATION) TABS Take 1 tablet by mouth 2 (two) times daily.      Multiple Vitamins-Minerals (MULTIVITAMIN PO) Take 1 tablet by mouth daily.      nystatin  (MYCOSTATIN /NYSTOP ) powder Apply 1 application topically 3 (three) times daily as needed. For rash/raw skin as needed 15 g 2   omeprazole  (PRILOSEC) 40 MG capsule TAKE 1 CAPSULE (40 MG TOTAL) BY MOUTH DAILY. 90 capsule 1   oxybutynin  (DITROPAN -XL) 10 MG 24 hr tablet TAKE 1 TABLET BY  MOUTH TWICE A DAY 180 tablet 2   REBIF  REBIDOSE 44 MCG/0.5ML SOAJ Inject 1 pen under the skin 3 times per week 6 mL 2   tiZANidine  (ZANAFLEX ) 4 MG tablet Take 1 tablet (4 mg total) by mouth at bedtime. 90 tablet 4   Turmeric 500 MG CAPS Take 500 mg by mouth daily.     Vibegron  (GEMTESA ) 75 MG TABS Take 1 tablet (75 mg total) by mouth daily. 42 tablet 0   No current facility-administered medications for this visit.    Allergies as of 02/23/2024 - Review Complete 02/23/2024  Allergen Reaction Noted   Fentanyl  Nausea And Vomiting and Nausea Only 02/04/2014   Sulfa antibiotics Hives and Other (See Comments) 06/08/2011    Family History  Problem Relation Age of Onset   Cancer Father        skin   Heart disease Father    Heart attack Father        heart attack in his 68's   Thyroid  disease Sister    Ovarian cancer Cousin    Breast cancer Maternal Aunt 60   Breast cancer Maternal Grandmother 73   Bladder Cancer Neg Hx    Kidney cancer Neg Hx     Social History   Socioeconomic History   Marital status: Married    Spouse name: don   Number of children: 0   Years of  education: 12   Highest education level: 12th grade  Occupational History   Occupation: disability  Tobacco Use   Smoking status: Never   Smokeless tobacco: Never   Tobacco comments:    Never smoked. My mom smoked. Passed 04/18/20. Dad smoked, quit 1973. Sister smokes.  Vaping Use   Vaping status: Never Used  Substance and Sexual Activity   Alcohol use: No   Drug use: No   Sexual activity: Not Currently    Partners: Male    Birth control/protection: None  Other Topics Concern   Not on file  Social History Narrative   She is married. Recently moved back to [place]  after being in [place]  for some time. She is accompanied by her husband and aunt.Never smoked. Never used alcohol.      1/29: husband currently in SNF in [place]. Aunt lives with her but unable to help care for her. Home health comes in once daily to change her. She does not get out of her recliner per patient   Social Drivers of Health   Tobacco Use: Low Risk (02/23/2024)   Patient History    Smoking Tobacco Use: Never    Smokeless Tobacco Use: Never    Passive Exposure: Not on file  Financial Resource Strain: Low Risk (11/18/2023)   Overall Financial Resource Strain (CARDIA)    Difficulty of Paying Living Expenses: Not hard at all  Food Insecurity: No Food Insecurity (11/18/2023)   Epic    Worried About Programme Researcher, Broadcasting/film/video in the Last Year: Never true    Ran Out of Food in the Last Year: Never true  Transportation Needs: Unmet Transportation Needs (11/18/2023)   Epic    Lack of Transportation (Medical): No    Lack of Transportation (Non-Medical): Yes  Physical Activity: Inactive (11/18/2023)   Exercise Vital Sign    Days of Exercise per Week: 0 days    Minutes of Exercise per Session: Not on file  Stress: Stress Concern Present (11/18/2023)   [employer] of Occupational Health - Occupational Stress Questionnaire    Feeling of Stress: To  some extent  Social Connections: Socially Isolated  (11/18/2023)   Social Connection and Isolation Panel    Frequency of Communication with Friends and Family: Once a week    Frequency of Social Gatherings with Friends and Family: Once a week    Attends Religious Services: Patient declined    Active Member of Clubs or Organizations: No    Attends Banker Meetings: Not on file    Marital Status: Married  Intimate Partner Violence: Not At Risk (10/05/2023)   Epic    Fear of Current or Ex-Partner: No    Emotionally Abused: No    Physically Abused: No    Sexually Abused: No  Depression (PHQ2-9): Low Risk (10/05/2023)   Depression (PHQ2-9)    PHQ-2 Score: 0  Alcohol Screen: Low Risk (10/05/2023)   Alcohol Screen    Last Alcohol Screening Score (AUDIT): 0  Housing: Low Risk (11/18/2023)   Epic    Unable to Pay for Housing in the Last Year: No    Number of Times Moved in the Last Year: 0    Homeless in the Last Year: No  Utilities: Not At Risk (10/05/2023)   Epic    Threatened with loss of utilities: No  Health Literacy: Adequate Health Literacy (10/05/2023)   B1300 Health Literacy    Frequency of need for help with medical instructions: Never     RELEVANT GI HISTORY, IMAGING AND LABS: CBC    Component Value Date/Time   WBC 4.7 08/17/2023 0921   RBC 4.01 08/17/2023 0921   HGB 11.5 (L) 08/17/2023 0921   HGB 11.4 (L) 03/22/2023 1011   HGB 12.4 06/27/2016 1515   HCT 35.2 (L) 08/17/2023 0921   HCT 37.9 06/27/2016 1515   PLT 196 08/17/2023 0921   PLT 224 03/22/2023 1011   PLT 163 06/27/2016 1515   MCV 87.8 08/17/2023 0921   MCV 92 06/27/2016 1515   MCV 93 06/05/2014 0951   MCH 28.7 08/17/2023 0921   MCHC 32.7 08/17/2023 0921   RDW 13.9 08/17/2023 0921   RDW 13.2 06/27/2016 1515   RDW 18.7 (H) 06/05/2014 0951   LYMPHSABS 1.7 08/17/2023 0921   LYMPHSABS 1.0 06/27/2016 1515   LYMPHSABS 0.3 (L) 06/05/2014 0951   MONOABS 0.4 08/17/2023 0921   MONOABS 0.3 06/05/2014 0951   EOSABS 0.2 08/17/2023 0921   EOSABS 0.1  06/27/2016 1515   EOSABS 0.1 06/05/2014 0951   BASOSABS 0.0 08/17/2023 0921   BASOSABS 0.0 06/27/2016 1515   BASOSABS 0.0 06/05/2014 0951   Recent Labs    03/22/23 1011 03/23/23 1153 03/24/23 1317 03/25/23 0600 03/26/23 0424 03/29/23 0550 03/30/23 0938 03/31/23 0546 07/13/23 2009 08/17/23 0921  HGB 11.4* 11.9* 12.6 10.6* 10.8* 10.1* 9.7* 9.3* 12.6 11.5*    CMP     Component Value Date/Time   NA 130 (L) 07/13/2023 2009   NA 143 07/28/2016 1520   NA 139 06/05/2014 0951   K 3.2 (L) 07/13/2023 2009   K 3.8 06/05/2014 0951   CL 100 07/13/2023 2009   CL 107 06/05/2014 0951   CO2 21 (L) 07/13/2023 2009   CO2 28 06/05/2014 0951   GLUCOSE 150 (H) 07/13/2023 2009   GLUCOSE 130 (H) 06/05/2014 0951   BUN 11 07/13/2023 2009   BUN 12 07/28/2016 1520   BUN 12 06/05/2014 0951   CREATININE 0.53 07/13/2023 2009   CREATININE 0.59 12/07/2022 1117   CALCIUM  8.5 (L) 07/13/2023 2009   CALCIUM  9.4 06/05/2014 0951   PROT 7.6  07/13/2023 2009   PROT 7.0 07/28/2016 1520   PROT 7.0 05/29/2014 0949   ALBUMIN 3.4 (L) 07/13/2023 2009   ALBUMIN 4.3 07/28/2016 1520   ALBUMIN 4.1 05/29/2014 0949   AST 23 07/13/2023 2009   AST 24 05/29/2014 0949   ALT 19 07/13/2023 2009   ALT 24 05/29/2014 0949   ALKPHOS 65 07/13/2023 2009   ALKPHOS 58 05/29/2014 0949   BILITOT 0.9 07/13/2023 2009   BILITOT 0.3 07/28/2016 1520   BILITOT 0.6 05/29/2014 0949   GFRNONAA >60 07/13/2023 2009   GFRNONAA 101 01/18/2019 0000   GFRAA 117 01/18/2019 0000      Latest Ref Rng & Units 07/13/2023    8:09 PM 03/24/2023    1:17 PM 03/09/2023    5:57 AM  Hepatic Function  Total Protein 6.5 - 8.1 g/dL 7.6  7.5  7.5   Albumin 3.5 - 5.0 g/dL 3.4  3.1  2.9   AST 15 - 41 U/L 23  30  76   ALT 0 - 44 U/L 19  32  61   Alk Phosphatase 38 - 126 U/L 65  95  78   Total Bilirubin 0.0 - 1.2 mg/dL 0.9  1.0  0.8       Review of Systems   All systems reviewed and negative except where noted in HPI.    Physical Exam  BP (!)  144/85   Pulse 92   Temp 98.5 F (36.9 C)   Ht 5' 4 (1.626 m)   Wt 205 lb (93 kg)   SpO2 96%   BMI 35.19 kg/m  No LMP recorded. Patient is postmenopausal. General:  Chronically ill appearing, lying on stretcher. Alert and oriented. In no acute distress.  Head:  Normocephalic and atraumatic. Eyes:  Without icterus Ears:  Normal auditory acuity. Lungs:  Respirations even and unlabored.  Clear throughout to auscultation.   No wheezes, crackles, or rhonchi. No acute distress. Heart:  Regular rate and rhythm; no murmurs, clicks, rubs, or gallops. Abdomen:  Normal bowel sounds.  No bruits.  Soft, non-tender and non-distended without masses, hepatosplenomegaly or hernias noted.  No guarding or rebound tenderness.   Rectal:  Deferred. Extremities:  2+ non pitting edema to BLE.  Neurologic:  Alert and  oriented x4;  grossly normal neurologically. Psych:  Alert and cooperative. Normal mood and affect.   Assessment & Plan   Rebecca [name] is a 64 y.o. female presenting today for follow-up on EPI, GERD and medication refills.   Exocrine pancreatic insufficiency: well-controlled on Creon  36,000 lipase units 2 with meals and 1 with snacks; no diarrhea, constipation, or stool changes. - Initiated paperwork for Creon  prescription renewal. - Patient given samples of Creon , refill sent to pharmacy.   GERD: well-controlled with omeprazole , no symptoms or complications. - Sent one-year refill for omeprazole    Colon cancer screening: Will order another Cologuard test   Grayce Bohr, DNP, AGNP-C [facility]   "

## 2024-02-23 ENCOUNTER — Ambulatory Visit: Admitting: Family Medicine

## 2024-02-23 ENCOUNTER — Encounter: Payer: Self-pay | Admitting: Family Medicine

## 2024-02-23 VITALS — BP 144/85 | HR 92 | Temp 98.5°F | Ht 64.0 in | Wt 205.0 lb

## 2024-02-23 DIAGNOSIS — K8681 Exocrine pancreatic insufficiency: Secondary | ICD-10-CM

## 2024-02-23 DIAGNOSIS — Z1211 Encounter for screening for malignant neoplasm of colon: Secondary | ICD-10-CM

## 2024-02-23 DIAGNOSIS — K219 Gastro-esophageal reflux disease without esophagitis: Secondary | ICD-10-CM

## 2024-02-23 DIAGNOSIS — K8689 Other specified diseases of pancreas: Secondary | ICD-10-CM

## 2024-02-23 DIAGNOSIS — Z760 Encounter for issue of repeat prescription: Secondary | ICD-10-CM

## 2024-02-23 MED ORDER — PANCRELIPASE (LIP-PROT-AMYL) 36000-114000 UNITS PO CPEP: 36000.0000 [IU] | ORAL_CAPSULE | Freq: Three times a day (TID) | ORAL | 2 refills | Status: AC

## 2024-02-23 MED ORDER — OMEPRAZOLE 40 MG PO CPDR: 40.0000 mg | DELAYED_RELEASE_CAPSULE | Freq: Every day | ORAL | 3 refills | Status: AC

## 2024-02-26 NOTE — Progress Notes (Signed)
 Rebecca [name]                                          MRN: [id number]   02/26/2024   The [facility] Quality Team Specialist reviewed this patient medical record for the purposes of chart review for care gap closure. The following were reviewed: chart review for care gap closure-glycemic status assessment.    [facility]I Quality Team

## 2024-02-27 ENCOUNTER — Encounter: Payer: Self-pay | Admitting: Diagnostic Neuroimaging

## 2024-02-29 ENCOUNTER — Encounter: Admitting: Family Medicine

## 2024-03-01 ENCOUNTER — Encounter: Admitting: Physician Assistant

## 2024-03-07 ENCOUNTER — Encounter: Admitting: Physician Assistant

## 2024-03-22 ENCOUNTER — Encounter: Admitting: Internal Medicine

## 2024-04-16 ENCOUNTER — Encounter: Admitting: Family Medicine

## 2024-10-10 ENCOUNTER — Ambulatory Visit

## 2025-02-24 ENCOUNTER — Ambulatory Visit: Admitting: Family Medicine
